# Patient Record
Sex: Female | Born: 1960 | Race: White | Hispanic: No | Marital: Married | Smoking: Former smoker
Health system: Southern US, Community
[De-identification: ages and names within clinical notes are randomized; demographics above are authoritative.]

## PROBLEM LIST (undated history)

## (undated) DIAGNOSIS — I219 Acute myocardial infarction, unspecified: Secondary | ICD-10-CM

## (undated) DIAGNOSIS — I10 Essential (primary) hypertension: Secondary | ICD-10-CM

## (undated) DIAGNOSIS — C801 Malignant (primary) neoplasm, unspecified: Secondary | ICD-10-CM

## (undated) DIAGNOSIS — Z923 Personal history of irradiation: Secondary | ICD-10-CM

## (undated) DIAGNOSIS — D649 Anemia, unspecified: Secondary | ICD-10-CM

## (undated) DIAGNOSIS — I509 Heart failure, unspecified: Secondary | ICD-10-CM

## (undated) DIAGNOSIS — G709 Myoneural disorder, unspecified: Secondary | ICD-10-CM

## (undated) DIAGNOSIS — E669 Obesity, unspecified: Secondary | ICD-10-CM

## (undated) DIAGNOSIS — I2510 Atherosclerotic heart disease of native coronary artery without angina pectoris: Secondary | ICD-10-CM

## (undated) DIAGNOSIS — F329 Major depressive disorder, single episode, unspecified: Secondary | ICD-10-CM

## (undated) DIAGNOSIS — E119 Type 2 diabetes mellitus without complications: Secondary | ICD-10-CM

## (undated) DIAGNOSIS — M1990 Unspecified osteoarthritis, unspecified site: Secondary | ICD-10-CM

## (undated) DIAGNOSIS — F32A Depression, unspecified: Secondary | ICD-10-CM

## (undated) DIAGNOSIS — G4730 Sleep apnea, unspecified: Secondary | ICD-10-CM

## (undated) DIAGNOSIS — E7800 Pure hypercholesterolemia, unspecified: Secondary | ICD-10-CM

## (undated) HISTORY — DX: Obesity, unspecified: E66.9

## (undated) HISTORY — DX: Unspecified osteoarthritis, unspecified site: M19.90

## (undated) HISTORY — DX: Acute myocardial infarction, unspecified: I21.9

## (undated) HISTORY — DX: Atherosclerotic heart disease of native coronary artery without angina pectoris: I25.10

## (undated) HISTORY — DX: Sleep apnea, unspecified: G47.30

## (undated) HISTORY — DX: Essential (primary) hypertension: I10

## (undated) HISTORY — PX: HERNIA REPAIR: SHX51

---

## 1987-09-04 HISTORY — PX: CHOLECYSTECTOMY OPEN: SUR202

## 1989-05-04 HISTORY — PX: UMBILICAL HERNIA REPAIR: SHX196

## 2008-11-11 ENCOUNTER — Encounter: Admission: RE | Admit: 2008-11-11 | Discharge: 2008-11-11 | Payer: Self-pay | Admitting: Specialist

## 2009-03-07 ENCOUNTER — Encounter: Payer: Self-pay | Admitting: Cardiovascular Disease

## 2009-03-08 ENCOUNTER — Encounter: Payer: Self-pay | Admitting: Cardiovascular Disease

## 2009-03-09 ENCOUNTER — Encounter: Payer: Self-pay | Admitting: Cardiovascular Disease

## 2009-05-01 ENCOUNTER — Emergency Department (HOSPITAL_COMMUNITY): Admission: EM | Admit: 2009-05-01 | Discharge: 2009-05-01 | Payer: Self-pay | Admitting: Emergency Medicine

## 2009-05-04 ENCOUNTER — Ambulatory Visit: Payer: Self-pay | Admitting: Cardiology

## 2009-05-05 ENCOUNTER — Inpatient Hospital Stay (HOSPITAL_COMMUNITY): Admission: EM | Admit: 2009-05-05 | Discharge: 2009-05-11 | Payer: Self-pay | Admitting: Emergency Medicine

## 2009-05-12 ENCOUNTER — Encounter: Payer: Self-pay | Admitting: Cardiovascular Disease

## 2009-05-24 DIAGNOSIS — E119 Type 2 diabetes mellitus without complications: Secondary | ICD-10-CM | POA: Insufficient documentation

## 2009-05-24 DIAGNOSIS — G4733 Obstructive sleep apnea (adult) (pediatric): Secondary | ICD-10-CM | POA: Insufficient documentation

## 2009-05-24 DIAGNOSIS — I10 Essential (primary) hypertension: Secondary | ICD-10-CM | POA: Insufficient documentation

## 2009-05-24 DIAGNOSIS — E669 Obesity, unspecified: Secondary | ICD-10-CM | POA: Insufficient documentation

## 2009-05-24 DIAGNOSIS — G4730 Sleep apnea, unspecified: Secondary | ICD-10-CM

## 2009-05-25 ENCOUNTER — Telehealth (INDEPENDENT_AMBULATORY_CARE_PROVIDER_SITE_OTHER): Payer: Self-pay | Admitting: *Deleted

## 2009-05-25 ENCOUNTER — Ambulatory Visit: Payer: Self-pay | Admitting: Cardiovascular Disease

## 2009-05-25 ENCOUNTER — Encounter: Payer: Self-pay | Admitting: Physician Assistant

## 2009-05-25 DIAGNOSIS — E1169 Type 2 diabetes mellitus with other specified complication: Secondary | ICD-10-CM | POA: Insufficient documentation

## 2009-05-25 DIAGNOSIS — I2510 Atherosclerotic heart disease of native coronary artery without angina pectoris: Secondary | ICD-10-CM | POA: Insufficient documentation

## 2009-05-25 DIAGNOSIS — E785 Hyperlipidemia, unspecified: Secondary | ICD-10-CM | POA: Insufficient documentation

## 2009-06-06 ENCOUNTER — Encounter: Payer: Self-pay | Admitting: Cardiovascular Disease

## 2009-06-30 ENCOUNTER — Encounter (HOSPITAL_COMMUNITY): Admission: RE | Admit: 2009-06-30 | Discharge: 2009-09-02 | Payer: Self-pay | Admitting: Cardiovascular Disease

## 2009-06-30 ENCOUNTER — Encounter: Payer: Self-pay | Admitting: Cardiovascular Disease

## 2009-07-04 ENCOUNTER — Encounter: Payer: Self-pay | Admitting: Cardiovascular Disease

## 2009-07-14 ENCOUNTER — Encounter: Payer: Self-pay | Admitting: Cardiovascular Disease

## 2009-07-21 ENCOUNTER — Encounter: Payer: Self-pay | Admitting: Cardiovascular Disease

## 2009-07-25 ENCOUNTER — Encounter: Payer: Self-pay | Admitting: Cardiovascular Disease

## 2009-07-25 ENCOUNTER — Encounter (INDEPENDENT_AMBULATORY_CARE_PROVIDER_SITE_OTHER): Payer: Self-pay | Admitting: *Deleted

## 2009-07-26 ENCOUNTER — Ambulatory Visit: Payer: Self-pay | Admitting: Cardiovascular Disease

## 2009-09-03 ENCOUNTER — Encounter (HOSPITAL_COMMUNITY): Admission: RE | Admit: 2009-09-03 | Discharge: 2009-12-02 | Payer: Self-pay | Admitting: Cardiovascular Disease

## 2009-09-03 HISTORY — PX: CORONARY ANGIOPLASTY WITH STENT PLACEMENT: SHX49

## 2009-09-24 ENCOUNTER — Emergency Department (HOSPITAL_COMMUNITY): Admission: EM | Admit: 2009-09-24 | Discharge: 2009-09-24 | Payer: Self-pay | Admitting: Emergency Medicine

## 2009-10-05 ENCOUNTER — Telehealth: Payer: Self-pay | Admitting: Cardiovascular Disease

## 2009-10-19 ENCOUNTER — Telehealth: Payer: Self-pay | Admitting: Cardiovascular Disease

## 2009-10-31 ENCOUNTER — Ambulatory Visit: Payer: Self-pay | Admitting: Cardiovascular Disease

## 2009-10-31 DIAGNOSIS — R079 Chest pain, unspecified: Secondary | ICD-10-CM | POA: Insufficient documentation

## 2009-11-07 ENCOUNTER — Telehealth (INDEPENDENT_AMBULATORY_CARE_PROVIDER_SITE_OTHER): Payer: Self-pay | Admitting: Radiology

## 2009-11-08 ENCOUNTER — Ambulatory Visit: Payer: Self-pay

## 2009-11-08 ENCOUNTER — Encounter (HOSPITAL_COMMUNITY): Admission: RE | Admit: 2009-11-08 | Discharge: 2010-01-03 | Payer: Self-pay | Admitting: Cardiovascular Disease

## 2009-11-08 ENCOUNTER — Ambulatory Visit: Payer: Self-pay | Admitting: Internal Medicine

## 2009-11-09 ENCOUNTER — Encounter (HOSPITAL_COMMUNITY): Admission: RE | Admit: 2009-11-09 | Discharge: 2010-01-03 | Payer: Self-pay | Admitting: Cardiovascular Disease

## 2009-11-09 ENCOUNTER — Ambulatory Visit: Payer: Self-pay

## 2009-11-11 ENCOUNTER — Encounter: Payer: Self-pay | Admitting: Cardiovascular Disease

## 2009-12-01 ENCOUNTER — Encounter: Payer: Self-pay | Admitting: Cardiovascular Disease

## 2010-01-18 ENCOUNTER — Emergency Department (HOSPITAL_COMMUNITY): Admission: EM | Admit: 2010-01-18 | Discharge: 2010-01-18 | Payer: Self-pay | Admitting: Emergency Medicine

## 2010-03-04 ENCOUNTER — Emergency Department (HOSPITAL_COMMUNITY): Admission: EM | Admit: 2010-03-04 | Discharge: 2010-03-04 | Payer: Self-pay | Admitting: Emergency Medicine

## 2010-03-09 ENCOUNTER — Inpatient Hospital Stay (HOSPITAL_COMMUNITY): Admission: EM | Admit: 2010-03-09 | Discharge: 2010-03-10 | Payer: Self-pay | Admitting: Emergency Medicine

## 2010-06-08 ENCOUNTER — Ambulatory Visit: Payer: Self-pay | Admitting: Cardiovascular Disease

## 2010-06-14 ENCOUNTER — Encounter: Payer: Self-pay | Admitting: Cardiovascular Disease

## 2010-06-16 ENCOUNTER — Encounter: Payer: Self-pay | Admitting: Cardiovascular Disease

## 2010-06-19 ENCOUNTER — Encounter: Payer: Self-pay | Admitting: Cardiovascular Disease

## 2010-07-10 ENCOUNTER — Telehealth: Payer: Self-pay | Admitting: Cardiovascular Disease

## 2010-10-03 NOTE — Miscellaneous (Signed)
Summary: [facility]  [facility]   Imported By: Roderic Ovens 09/06/2009 16:09:51  _____________________________________________________________________  External Attachment:    Type:   Image     Comment:   External Document

## 2010-10-03 NOTE — Progress Notes (Signed)
Summary: Nuc Pre-Procedure  Phone Note Outgoing Call Call back at Home Phone [phone]   Call placed by: Harlow Asa, CNMT Call placed to: Patient Reason for Call: Confirm/change Appt Summary of Call: Reviewed information on Myoview Information Sheet (see scanned document for further details).  Spoke with patient.     Nuclear Med Background Indications for Stress Test: Evaluation for Ischemia, Stent Patency   History: Angioplasty, Heart Catheterization, Myocardial Infarction, Myocardial Perfusion Study, Stents  History Comments: 7/10- Positve MPS-Cath- PTCA-Diag1. 9/10- MI treated with Stents- LAD, Diag1. EF= 45% OSA(CPAP)     Nuclear Pre-Procedure Cardiac Risk Factors: Family History - CAD, Hypertension, IDDM Type 2, Lipids Height (in): 62

## 2010-10-03 NOTE — Progress Notes (Signed)
Summary: CHEST PAINS LAST NIGHT NO PAINS TODAY BUT TOOK NITRO LAST NIGHT  Phone Note Call from Patient Call back at Home Phone [phone]   Caller: Patient Summary of Call: CHEST PAINS  LAST NIGHT TOOK NITRO.NO CHEST PAINS TODAY. Initial call taken by: Judie Grieve,  October 19, 2009 11:03 AM  Follow-up for Phone Call        Called patient ....she states that her father passed away suddenly on 10-29-2022 and she had some chest discomfort that day but felt she did not need to take NTG. Yesterday she had chest pain with minimal activity and took 1 NTG with relief of pain. Today she is pain free. Advised she could see another physician but not Dr.Heavenlee Maiorana because he is not in the office this week. She wants to wait to see him week after next for the appointment that is already scheduled. She was advised to call us back if she has any recurrrent chest pain because she would need to be seen ASAP or go to the ER...she verbalized understanding. Follow-up by: Suzan Garibaldi RN

## 2010-10-03 NOTE — Miscellaneous (Signed)
Summary: [facility]  [facility]   Imported By: Roderic Ovens 01/17/2010 16:01:11  _____________________________________________________________________  External Attachment:    Type:   Image     Comment:   External Document

## 2010-10-03 NOTE — Progress Notes (Signed)
Summary: pt cant afford meds  Phone Note Call from Patient Call back at Home Phone [phone]   Caller: Patient Summary of Call: pt having trouble getting her EFFIENT 10 MG TABS take one daily because she cant afford it and wants to talk to someone about it Initial call taken by: Omer Jack,  October 05, 2009 9:41 AM  Follow-up for Phone Call        [name] medicaid has been cancelled for now. She is working with them to get it reinstated. Wanted to know if we had any samples. I do have samples and will give her a 2 month supply. It was left up front. Follow-up by: Duncan Dull, RN, BSN,  October 05, 2009 11:46 AM     Appended Document: pt cant afford meds Pt had complex bifurcation stenting and requires lont-term effient. Can we give her info about the drug-assistance program since she has lost her insurance? thx  Appended Document: pt cant afford meds I spoke with the pt and she will pick-up [employer] form today.

## 2010-10-03 NOTE — Assessment & Plan Note (Signed)
Summary: ROV   Visit Type:  Follow-up Primary Provider:  Lerry Liner, MD  CC:  No complaints.  History of Present Illness: This is a 50 year-old diabetic woman who presented with ACS in September 2010. She had complex CAD involving the LAD/diagonal bifurcation and was treated with bifurcation stenting using DES.   The patient had a low-risk Myoview study performed in March 2011 showing probable attenuation artifact but no significant ischemia.  The patient is doing well at present and has no chest pain, dyspnea, edema, or palpitations. She denies orthopnea or PND. Reports regular walking for exercise, about 20 minutes a few times per week.    Current Medications (verified): 1)  Aspirin 81 Mg Tbec (Aspirin) .... Take One Tablet By Mouth Daily 2)  Metoprolol Tartrate 25 Mg Tabs (Metoprolol Tartrate) .Marland Kitchen.. 1 Tab Two Times A Day 3)  Effient 10 Mg Tabs (Prasugrel Hcl) .... Take One Daily 4)  Humalog 100 Unit/ml Soln (Insulin Lispro (Human)) .Marland Kitchen.. 10 Units Three Times A Day 5)  Levemir Flexpen 100 Unit/ml Soln (Insulin Detemir) .... 60 Units Twice Daily 6)  Lipitor 40 Mg Tabs (Atorvastatin Calcium) .... Take One Tablet By Mouth Every Evening 7)  Nitrostat 0.4 Mg Subl (Nitroglycerin) .... Take As Needed 8)  Quinapril Hcl 40 Mg Tabs (Quinapril Hcl) .... Take 1/2 Tablet By Mouth Daily 9)  Isosorbide Mononitrate Cr 30 Mg Xr24h-Tab (Isosorbide Mononitrate) .... Take One Tablet By Mouth Daily 10)  Pristiq 50 Mg Xr24h-Tab (Desvenlafaxine Succinate) .... Take 1 Tablet By Mouth Once A Day  Allergies: 1)  ! * Plavix  Past History:  Past medical history reviewed for relevance to current acute and chronic problems.  Past Medical History: Reviewed history from 07/26/2009 and no changes required.  She has a   history of obesity, history of diabetes on insulin, history of   hypertension, history of obstructive sleep apnea on CPAP, CAD s/p NSTEMI 05/2009 treated with drug-eluting  stents.  Review of Systems       Negative except as per HPI   Vital Signs:  Patient profile:   50 year old female Height:      62 inches Weight:      265.75 pounds BMI:     48.78 Pulse rate:   76 / minute Pulse rhythm:   regular Resp:     18 per minute BP sitting:   110 / 78  (left arm) Cuff size:   large  Vitals Entered By: Vikki Ports (June 08, 2010 3:21 PM)  Physical Exam  General:  Pt is alert and oriented, morbidly obese woman, in no acute distress. HEENT: normal Neck: normal carotid upstrokes without bruits, JVP normal Lungs: CTA CV: RRR without murmur or gallop Abd: soft, NT, positive BS, no bruit, no organomegaly Ext: no clubbing, cyanosis, or edema. peripheral pulses 2+ and equal Skin: warm and dry without rash    EKG  Procedure date:  06/08/2010  Findings:      NSR 76 bpm, RBBB  Impression & Recommendations:  Problem # 1:  CAD, NATIVE VESSEL (ICD-414.01) Pt stable without angina. She has undergone complex bifurcation stenting and should remain on long-term dual antiplatelet Rx if tolerated. Encouraged increased exercise with a goal of significant weight loss.  Her updated medication list for this problem includes:    Aspirin 81 Mg Tbec (Aspirin) .Marland Kitchen... Take one tablet by mouth daily    Metoprolol Tartrate 25 Mg Tabs (Metoprolol tartrate) .Marland Kitchen... 1 tab two times a day  Effient 10 Mg Tabs (Prasugrel hcl) .Marland Kitchen... Take one daily    Nitrostat 0.4 Mg Subl (Nitroglycerin) .Marland Kitchen... Take as needed    Quinapril Hcl 40 Mg Tabs (Quinapril hcl) .Marland Kitchen... Take 1/2 tablet by mouth daily    Isosorbide Mononitrate Cr 30 Mg Xr24h-tab (Isosorbide mononitrate) .Marland Kitchen... Take one tablet by mouth daily  Orders: EKG w/ Interpretation (93000)  Problem # 2:  HYPERLIPIDEMIA-MIXED (ICD-272.4) Followed by Dr Mayford Knife. Goal LDL 70 mg/dL.  Her updated medication list for this problem includes:    Lipitor 40 Mg Tabs (Atorvastatin calcium) .Marland Kitchen... Take one tablet by mouth every  evening  Orders: EKG w/ Interpretation (93000)  Problem # 3:  HYPERTENSION (ICD-401.9) Well-controlled.  The following medications were removed from the medication list:    Indapamide 1.25 Mg Tabs (Indapamide) .Marland Kitchen... 1 tab once daily Her updated medication list for this problem includes:    Aspirin 81 Mg Tbec (Aspirin) .Marland Kitchen... Take one tablet by mouth daily    Metoprolol Tartrate 25 Mg Tabs (Metoprolol tartrate) .Marland Kitchen... 1 tab two times a day    Quinapril Hcl 40 Mg Tabs (Quinapril hcl) .Marland Kitchen... Take 1/2 tablet by mouth daily  Orders: EKG w/ Interpretation (93000)  BP today: 110/78 Prior BP: 110/70 (10/31/2009)  Patient Instructions: 1)  Your physician recommends that you continue on your current medications as directed. Please refer to the Current Medication list given to you today. 2)  Your physician wants you to follow-up in: 6 MONTHS.   You will receive a reminder letter in the mail two months in advance. If you don't receive a letter, please call our office to schedule the follow-up appointment. Prescriptions: ISOSORBIDE MONONITRATE CR 30 MG XR24H-TAB (ISOSORBIDE MONONITRATE) Take one tablet by mouth daily  #30 x 8   Entered by:   Julieta Gutting, RN, BSN   Authorized by:   Norva Karvonen, MD   Signed by:   Julieta Gutting, RN, BSN on 06/08/2010   Method used:   Electronically to        [facility] [address] [id number]       [address]       [place], [place]  [address]       Ph: [phone]       Fax: [phone]   RxID:   [id number] EFFIENT 10 MG TABS (PRASUGREL HCL) take one daily  #30 x 8   Entered by:   Julieta Gutting, RN, BSN   Authorized by:   Norva Karvonen, MD   Signed by:   Julieta Gutting, RN, BSN on 06/08/2010   Method used:   [facility]ronically to        [facility] [address] [phone][facility] (retail)       [address]       [place], [place]  [address]       Ph: [phone]       Fax: [phone]   RxID:    [id number] LIPITOR 40 MG TABS (ATORVASTATIN CALCIUM) take one tablet by mouth every evening  #30 x 8   Entered by:   Julieta Gutting, RN, BSN   Authorized by:   Norva Karvonen, MD   Signed by:   Julieta Gutting, RN, BSN on 06/08/2010   Method used:   Electron[facility] to        [facility] [phone][facility] (retail)  [address]       [place], [place]  [address]       Ph: [phone]       Fax: [phone]   RxID:   [id number] QUINAPRIL HCL 40 MG TABS (QUINAPRIL HCL) take 1/2 tablet by mouth daily  #30 x 8   Entered by:   Julieta Gutting, RN, BSN   Authorized by:   Norva Karvonen, MD   Signed by:   Julieta Gutting, RN, BSN on 06/08/2010   Method used:   [facility] to        [id number]       [address]       [place], [place]  [address]       Ph: [phone]       Fax: [phone]   RxID:   [id number]

## 2010-10-03 NOTE — Miscellaneous (Signed)
Summary: [facility]   [facility]   Imported By: Roderic Ovens 11/23/2009 15:14:57  _____________________________________________________________________  External Attachment:    Type:   Image     Comment:   External Document

## 2010-10-03 NOTE — Letter (Signed)
Summary: [facility] [employer]  [facility] [employer]   Imported By: Marylou Mccoy 08/01/2010 14:35:07  _____________________________________________________________________  External Attachment:    Type:   Image     Comment:   External Document

## 2010-10-03 NOTE — Letter (Signed)
Summary: [place] Medicaid  [place] Medicaid   Imported By: Marylou Mccoy 07/04/2010 15:20:49  _____________________________________________________________________  External Attachment:    Type:   Image     Comment:   External Document

## 2010-10-03 NOTE — Letter (Signed)
Summary: [place] Medicaid and Health Choice Prior Authorization Form   [place] Medicaid and Health Choice Prior Authorization Form   Imported By: Roderic Ovens 07/14/2010 14:24:13  _____________________________________________________________________  External Attachment:    Type:   Image     Comment:   External Document

## 2010-10-03 NOTE — Letter (Signed)
Summary: [facility]   [facility]   Imported By: Kassie Mends 09/05/2009 08:23:49  _____________________________________________________________________  External Attachment:    Type:   Image     Comment:   External Document

## 2010-10-03 NOTE — Letter (Signed)
Summary: [place] Medicaid and Health Choice Prior Authorization Form   [place] Medicaid and Health Choice Prior Authorization Form   Imported By: Roderic Ovens 07/14/2010 14:22:59  _____________________________________________________________________  External Attachment:    Type:   Image     Comment:   External Document

## 2010-10-03 NOTE — Assessment & Plan Note (Signed)
Summary: Cardiology Nuclear Study  Nuclear Med Background Indications for Stress Test: Evaluation for Ischemia, Stent Patency   History: Angioplasty, Heart Catheterization, Myocardial Infarction, Myocardial Perfusion Study, Stents  History Comments: 7/10- Positve MPS-Cath- PTCA-Diag1. 9/10- MI treated with Stents- LAD, Diag1. EF= 45% OSA(CPAP)  Symptoms: Chest Pain    Nuclear Pre-Procedure Cardiac Risk Factors: Family History - CAD, Hypertension, IDDM Type 2, Lipids, NIDDM Caffeine/Decaff Intake: none NPO After: 8:00 PM Lungs: clear IV 0.9% NS with Angio Cath: 20g     IV Site: (R) AC IV Started by: Stanton Kidney EMT-P Chest Size (in) 48     Cup Size D     Height (in): 62 Weight (lb): 274 BMI: 50.30 Tech Comments: CBG = 210 @ 7:30 am this day, per Pt.  Nuclear Med Study 1 or 2 day study:  2 day     Stress Test Type:  Eugenie Birks Reading MD:  Arvilla Meres, MD     Referring MD:  M.Cooper Resting Radionuclide:  Technetium 30m Tetrofosmin     Resting Radionuclide Dose:  33.0 mCi  Stress Radionuclide:  Technetium 53m Tetrofosmin     Stress Radionuclide Dose:  33.0 mCi   Stress Protocol   Lexiscan: 0.4 mg   Stress Test Technologist:  Milana Na EMT-P     Nuclear Technologist:  Burna Mortimer Deal RT-N  Rest Procedure  Myocardial perfusion imaging was performed at rest 45 minutes following the intravenous administration of Myoview Technetium 63m Tetrofosmin.  Stress Procedure  The patient received IV Lexiscan 0.4 mg over 15-seconds.  Myoview injected at 30-seconds.  There were no significant changes with infusion.  Quantitative spect images were obtained after a 45 minute delay.  QPS Raw Data Images:  There is a breast shadow that accounts for the anterior attenuation. Stress Images:  There is minimal decreased uptake int he mid anterior wall on one vertical long axis image. Rest Images:  Normal homogeneous uptake in all areas of the myocardium. Subtraction (SDS):  There is a  trivial reversible defect in the mid anterior wall on one image on the vertical long axis images. Suspect breast attenuation but cannot exclude trivial ischemia. Transient Ischemic Dilatation:  .74  (Normal <1.22)  Lung/Heart Ratio:  .24  (Normal <0.45)  Quantitative Gated Spect Images QGS EDV:  61 ml QGS ESV:  12 ml QGS EF:  80 % QGS cine images:  Normal  Findings Low risk nuclear study      Overall Impression  Exercise Capacity: Lexiscan study with no exercise. ECG Impression: Baseline: NSR with RBBB; No significant ST segment change with Lexscan. Overall Impression: Low risk stress nuclear study. Overall Impression Comments: There is a trivial reversible defect in the mid anterior wall on one image on the vertical long axis images. Suspect breast attenuation but cannot exclude trivial ischemia.  Appended Document: Cardiology Nuclear Study Low-risk result. continue medical therapy.  Appended Document: Cardiology Nuclear Study PT AWARE./CY

## 2010-10-03 NOTE — Progress Notes (Signed)
Summary: Change cholesterol medication   Phone Note Outgoing Call   Call placed by: Lisabeth Devoid RN,  July 10, 2010 9:56 AM Call placed to: Patient Summary of Call: medication change  Follow-up for Phone Call        I spoke with pt about cholesterol medication change. She will start Simvastatin today and we will send her a reminder for lipids/liver in 3 months. Mylo Red RN    New/Updated Medications: SIMVASTATIN 40 MG TABS (SIMVASTATIN) Take 1 by mouth at bedtime everyday Prescriptions: SIMVASTATIN 40 MG TABS (SIMVASTATIN) Take 1 by mouth at bedtime everyday  #30 x 6   Entered by:   Lisabeth Devoid RN   Authorized by:   Norva Karvonen, MD   Signed by:   Lisabeth Devoid RN on 07/10/2010   Method used:   [facility] to        [facility] [address] [phone])       [address]       [place], [place]  [address]       Ph: [phone]       Fax: [phone]   RxID:   [id number]

## 2010-10-03 NOTE — Assessment & Plan Note (Signed)
Summary: per check out/sf   Visit Type:  Follow-up Primary Provider:  Lerry Liner, MD  CC:  CP at rehab on Friday.  History of Present Illness: This is a 50 year-old diabetic woman who presented with ACS in September 2010. She had complex CAD involving the LAD/diagonal bifurcation and was treated with bifurcation stenting using DES. She has gone througha stressful time of late with the passing of her father. She has noted recurrent chest pain, both with stress and sometimes at rest. She also noted chest pain on the treadmill at cardiac rehab last week, but has felt ok since that time. Describes the pain as a pressure, but feels different thatn her previous episodes of chest pain predating her stenting procedure. Denies dyspnea, orthopnea, edema, or PND.  Current Medications (verified): 1)  Bufferin 325 Mg Tabs (Aspirin Buf(Cacarb-Mgcarb-Mgo)) .... Take One Daily 2)  Metoprolol Tartrate 25 Mg Tabs (Metoprolol Tartrate) .Marland Kitchen.. 1 Tab Two Times A Day 3)  Effient 10 Mg Tabs (Prasugrel Hcl) .... Take One Daily 4)  Humalog 100 Unit/ml Soln (Insulin Lispro (Human)) .Marland Kitchen.. 10 Units Three Times A Day 5)  Levemir Flexpen 100 Unit/ml Soln (Insulin Detemir) .... 60 Units Twice Daily 6)  Lipitor 40 Mg Tabs (Atorvastatin Calcium) .... Take One Tablet By Mouth Every Evening 7)  Nitrostat 0.4 Mg Subl (Nitroglycerin) .... Take As Needed 8)  Quinapril Hcl 40 Mg Tabs (Quinapril Hcl) .... Take 1/2 Tablet By Mouth Daily 9)  Indapamide 1.25 Mg Tabs (Indapamide) .Marland Kitchen.. 1 Tab Once Daily 10)  Metformin Hcl 500 Mg Tabs (Metformin Hcl) .Marland Kitchen.. 1 Tab Two Times A Day  Allergies (verified): 1)  ! * Plavix  Past History:  Past medical history reviewed for relevance to current acute and chronic problems.  Past Medical History: Reviewed history from 07/26/2009 and no changes required.  She has a   history of obesity, history of diabetes on insulin, history of   hypertension, history of obstructive sleep apnea on CPAP,  CAD s/p NSTEMI 05/2009 treated with drug-eluting stents.  Review of Systems       Negative except as per HPI   Vital Signs:  Patient profile:   50 year old female Height:      62 inches Weight:      275 pounds BMI:     50.48 Pulse rate:   82 / minute Resp:     16 per minute BP sitting:   110 / 70  (left arm) Cuff size:   regular  Vitals Entered By: Hardin Negus, RMA (October 31, 2009 9:09 AM)  Physical Exam  General:  Pt is alert and oriented, morbidly obese woman, in no acute distress. HEENT: normal Neck: normal carotid upstrokes without bruits, JVP normal Lungs: CTA CV: RRR without murmur or gallop Abd: soft, NT, positive BS, no bruit, no organomegaly Ext: no clubbing, cyanosis, or edema. peripheral pulses 2+ and equal Skin: warm and dry without rash    EKG  Procedure date:  10/31/2009  Findings:      NSR with RBBB< HR 82 bpm.  Impression & Recommendations:  Problem # 1:  CAD, NATIVE VESSEL (ICD-414.01) Pt with recurrent chest pain, both typical and atypical features. She underwent high-risk PCI last year with bifurcation stenting. Recommend adenosine Myoview to rule-out significant ischemia. Otherwise continue DAPT with ASA and Effient at present. She should remain on lifelong Effient if tolerated after LAD bifurcation stenting.  Her updated medication list for this problem includes:    Aspirin 81 Mg Tbec (  Aspirin) .Marland Kitchen... Take one tablet by mouth daily    Metoprolol Tartrate 25 Mg Tabs (Metoprolol tartrate) .Marland Kitchen... 1 tab two times a day    Effient 10 Mg Tabs (Prasugrel hcl) .Marland Kitchen... Take one daily    Nitrostat 0.4 Mg Subl (Nitroglycerin) .Marland Kitchen... Take as needed    Quinapril Hcl 40 Mg Tabs (Quinapril hcl) .Marland Kitchen... Take 1/2 tablet by mouth daily    Isosorbide Mononitrate Cr 30 Mg Xr24h-tab (Isosorbide mononitrate) .Marland Kitchen... Take one tablet by mouth daily  Orders: EKG w/ Interpretation (93000) Nuclear Stress Test (Nuc Stress Test)  Problem # 2:  HYPERLIPIDEMIA-MIXED  (ICD-272.4) Followed by PCP, Dr Mayford Knife. LDL goal less than 70 mg/dL.  Her updated medication list for this problem includes:    Lipitor 40 Mg Tabs (Atorvastatin calcium) .Marland Kitchen... Take one tablet by mouth every evening  Problem # 3:  HYPERTENSION (ICD-401.9) BP well-controlled.  Her updated medication list for this problem includes:    Aspirin 81 Mg Tbec (Aspirin) .Marland Kitchen... Take one tablet by mouth daily    Metoprolol Tartrate 25 Mg Tabs (Metoprolol tartrate) .Marland Kitchen... 1 tab two times a day    Quinapril Hcl 40 Mg Tabs (Quinapril hcl) .Marland Kitchen... Take 1/2 tablet by mouth daily    Indapamide 1.25 Mg Tabs (Indapamide) .Marland Kitchen... 1 tab once daily  BP today: 110/70 Prior BP: 116/70 (07/26/2009)  Patient Instructions: 1)  Your physician recommends that you schedule a follow-up appointment in: 3 months 2)  Your physician has recommended you make the following change in your medication: Aspirin 81 mg take one tablet by mouth daily. Imdur 30 mg take onr tablet by mouth once a day 3)  Your physician has requested that you have an adenosine myoview.  For further information please visit [email or web address]  Please follow instruction sheet, as given. Prescriptions: ISOSORBIDE MONONITRATE CR 30 MG XR24H-TAB (ISOSORBIDE MONONITRATE) Take one tablet by mouth daily  #30 x 6   Entered by:   Ollen Gross, RN, BSN   Authorized by:   Norva Karvonen, MD   Signed by:   Ollen Gross, RN, BSN on 10/31/2009   Method used:   [facility] to        [facility] [address] [id number]       [address]       [place], [place]  [address]       Ph: [phone]       Fax: [phone]   RxID:   [id number]

## 2010-10-04 ENCOUNTER — Other Ambulatory Visit: Payer: Self-pay

## 2010-10-04 ENCOUNTER — Ambulatory Visit: Admit: 2010-10-04 | Payer: Self-pay | Admitting: Cardiovascular Disease

## 2010-10-31 IMAGING — US US RENAL PORT
1 series · 14 of 21 positions shown · non-contrast
Comparison: None.

CLINICAL DATA: Nausea, vomiting, diarrhea, hypotension and
dehydration.

RENAL/URINARY TRACT ULTRASOUND COMPLETE

[Series 1: us renal port · 0.31mm/px · 14 of 21 slices shown]
[im 1/21]
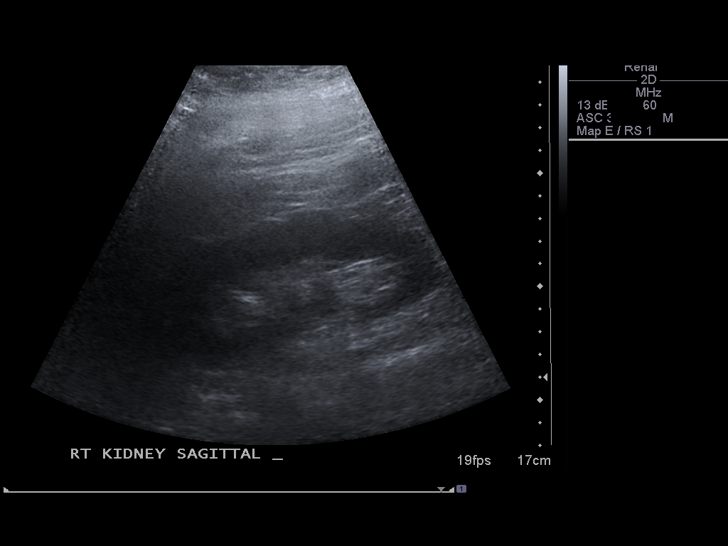
[im 3/21]
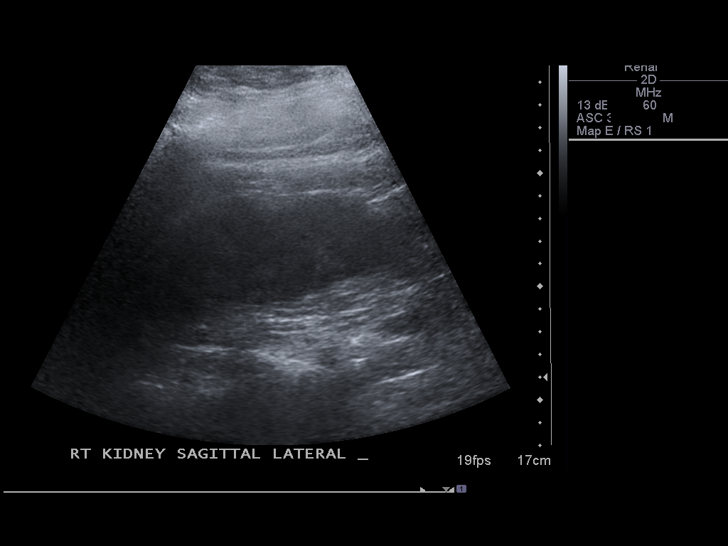
[im 4/21]
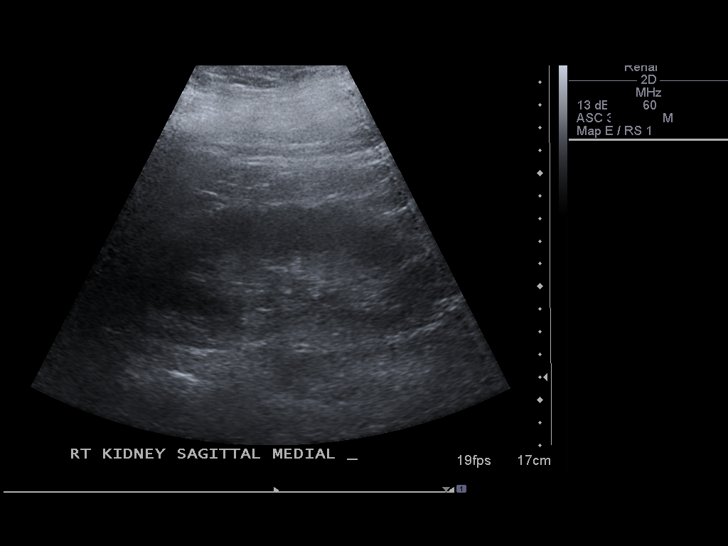
[im 6/21]
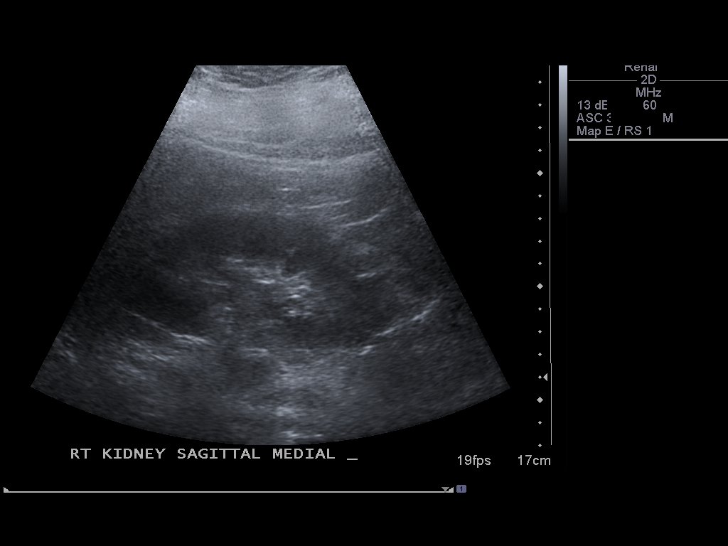
[im 7/21]
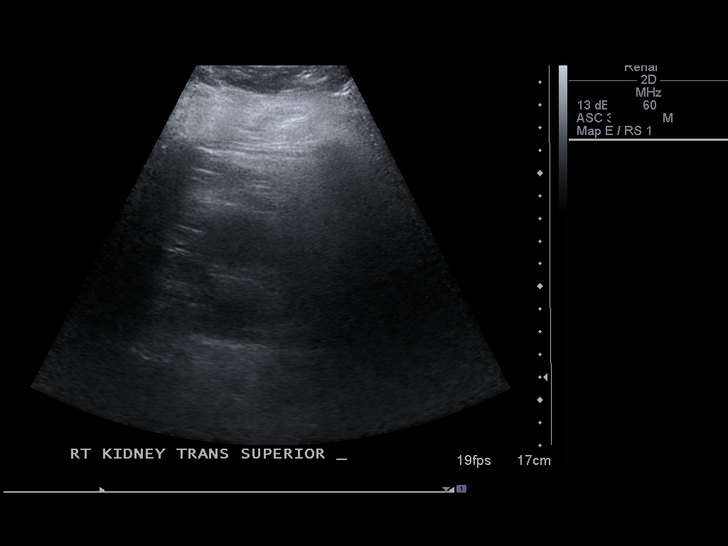
[im 9/21]
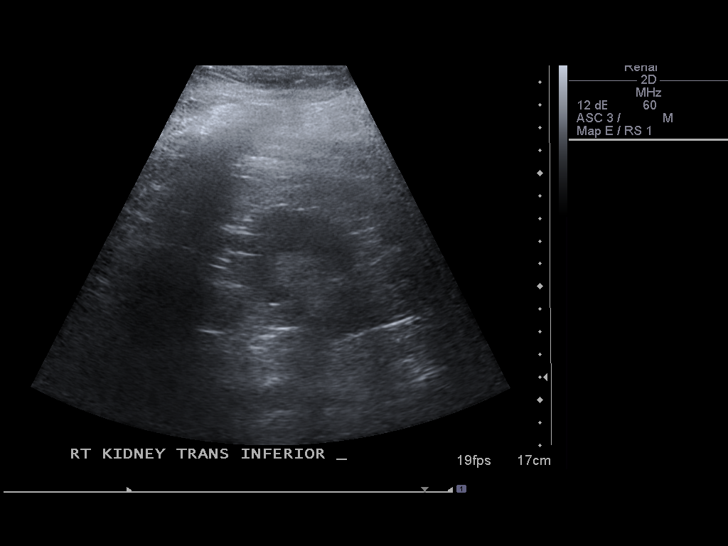
[im 10/21]
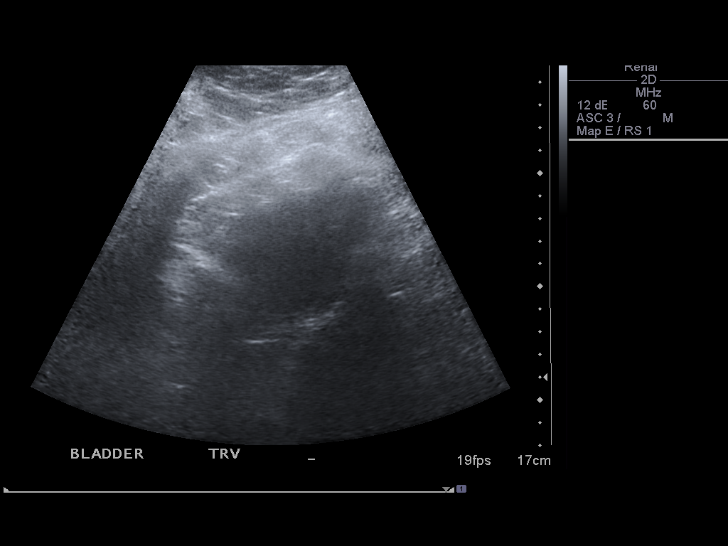
[im 12/21]
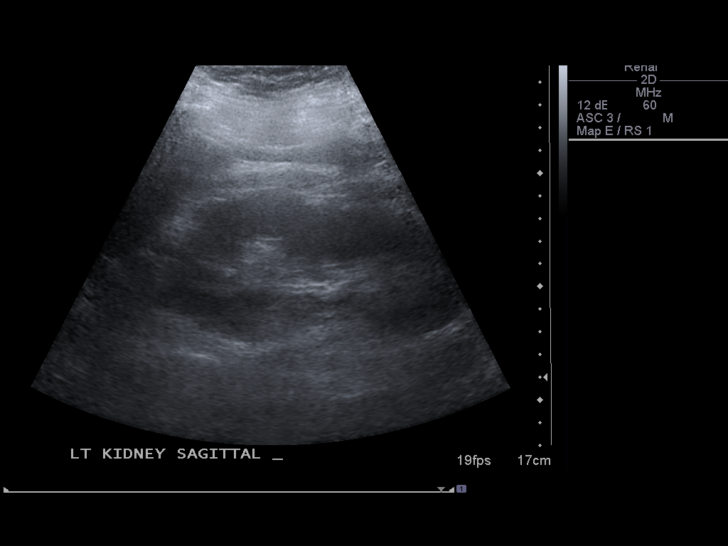
[im 13/21]
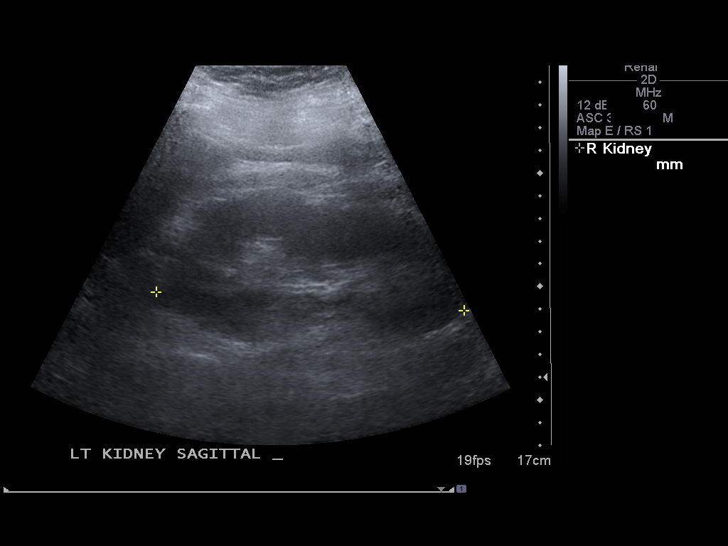
[im 15/21]
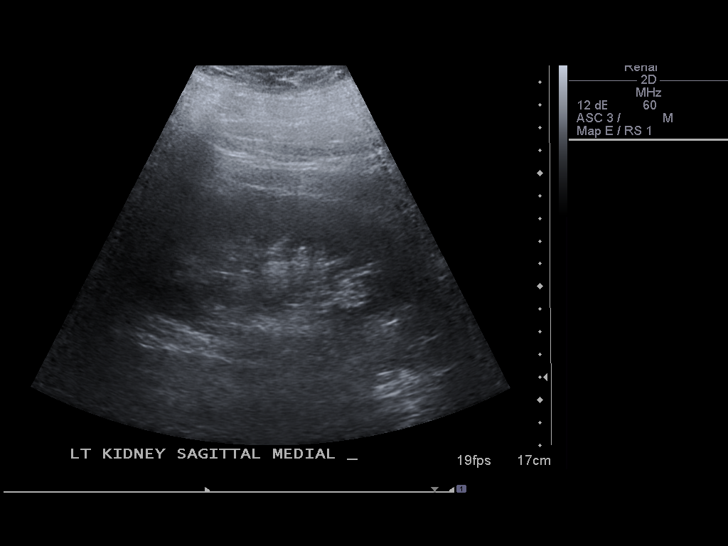
[im 16/21]
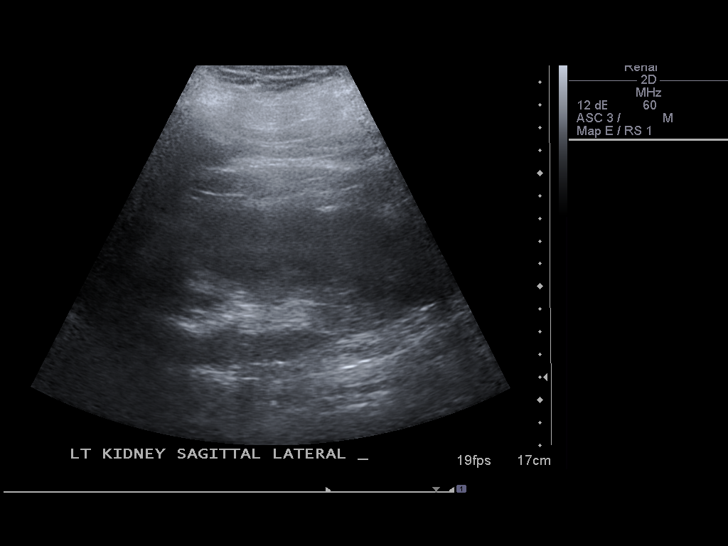
[im 18/21]
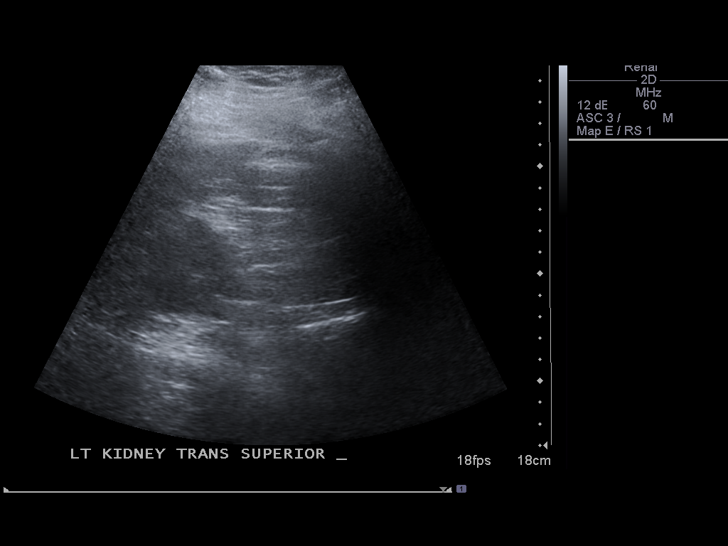
[im 19/21]
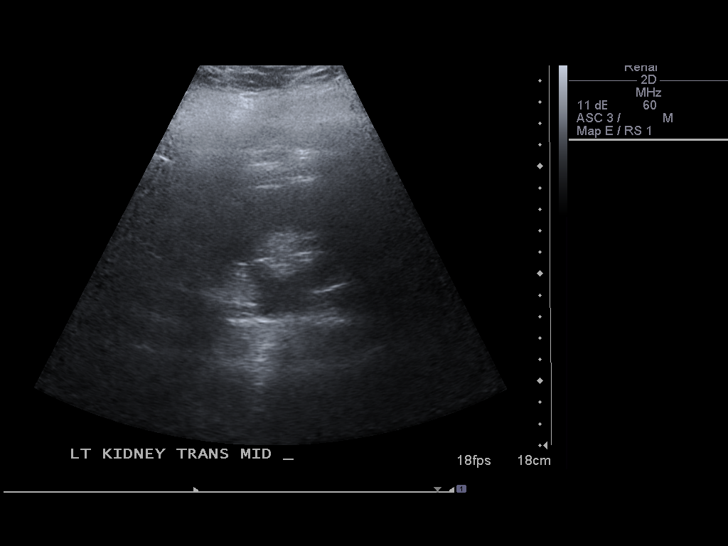
[im 21/21]
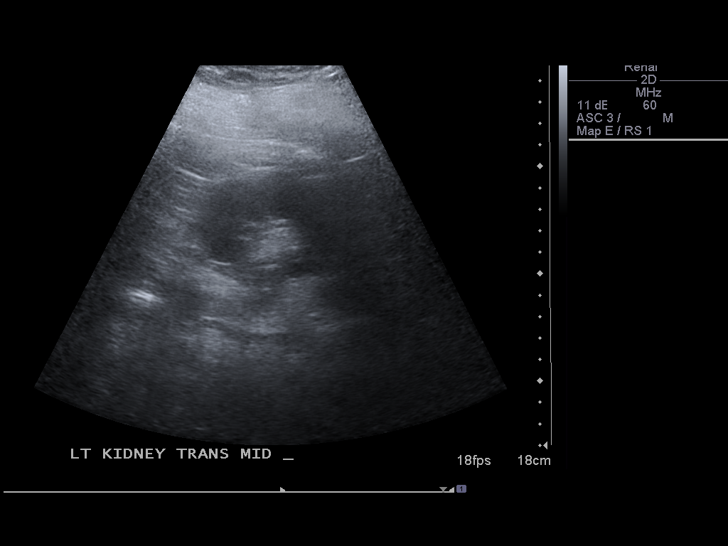

[14 of 21 positions shown; findings below may reference images not displayed]

FINDINGS: Right Kidney:  Measures 13.4 cm, negative.

Left Kidney:  Measures 13.6 cm, negative.

Bladder:  Foley catheter is in place.  Bladder is decompressed,
limiting visualization.
IMPRESSION: No acute findings.

## 2010-10-31 IMAGING — CR DG ABD PORTABLE 1V
1 series · 2 of 2 positions shown · non-contrast
Comparison: None.

CLINICAL DATA: Nausea, vomiting, hypotension, dehydration.

ABDOMEN - 1 VIEW

[Series 1: series (date) · U · 2 of 2 slices shown]
[im 1/2]
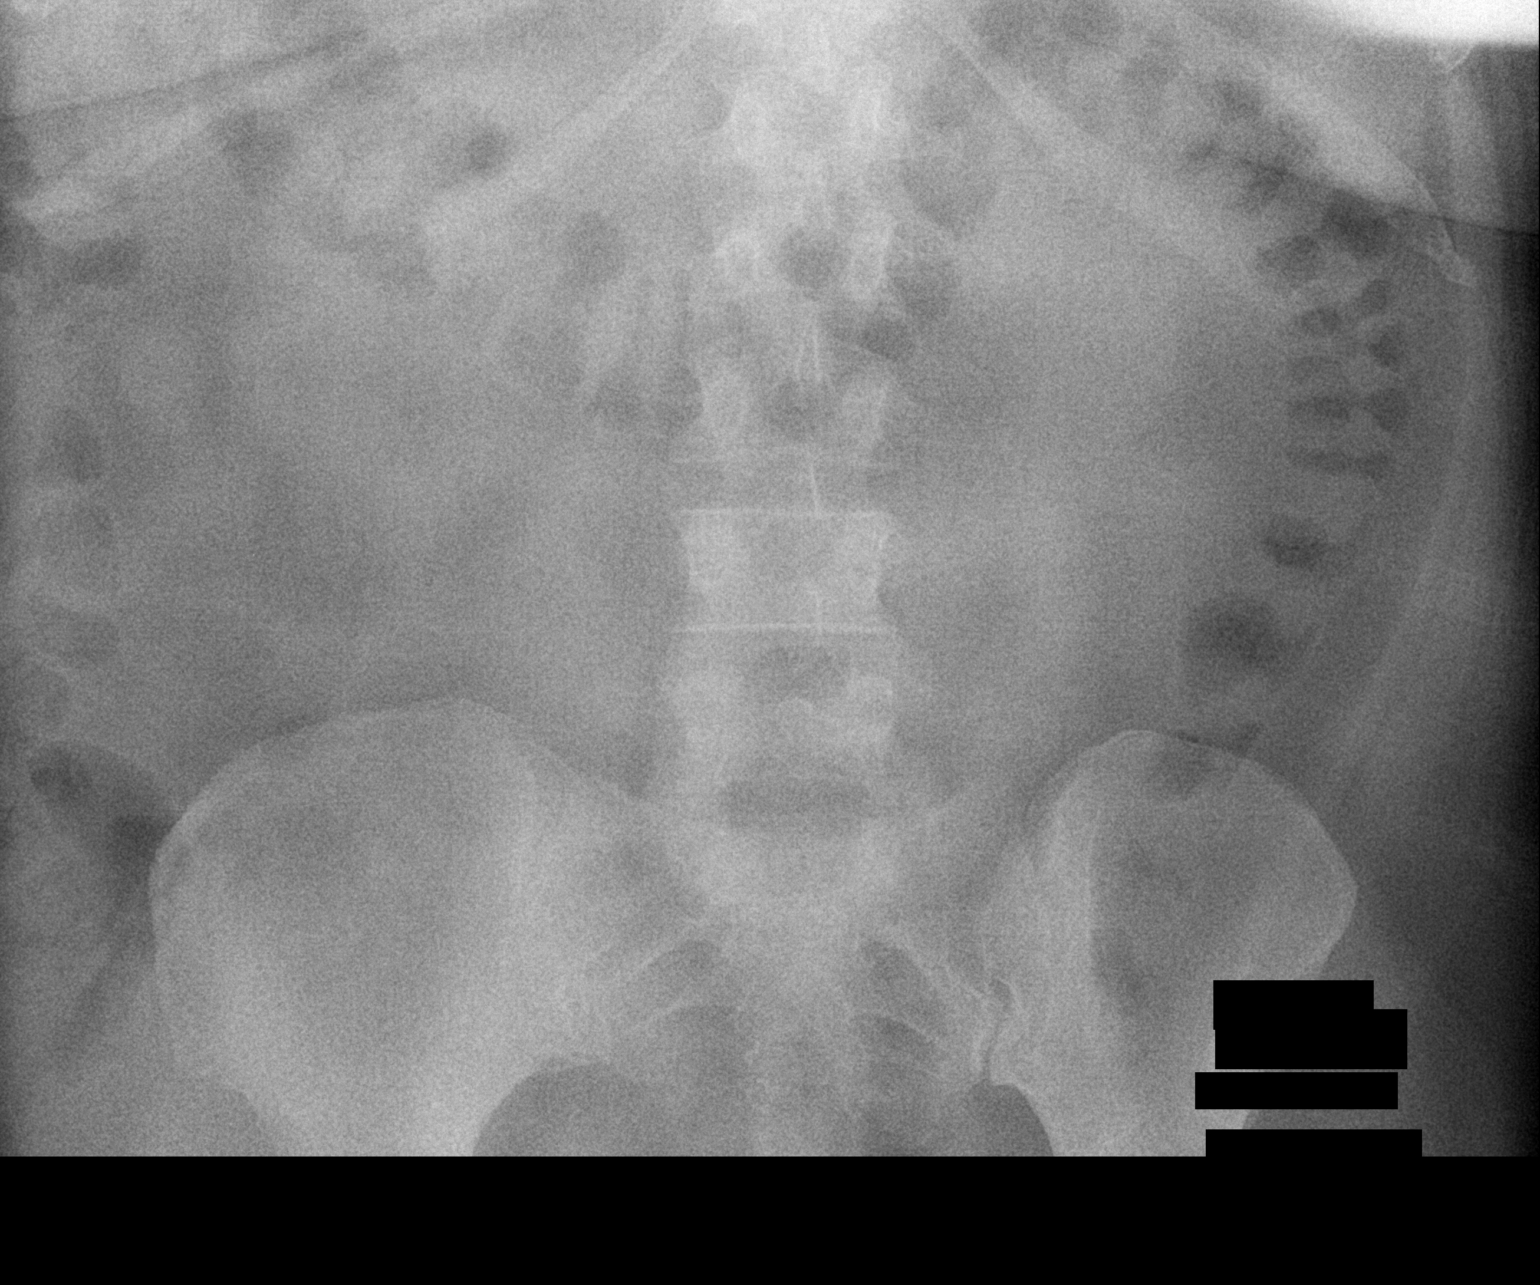
[im 2/2]
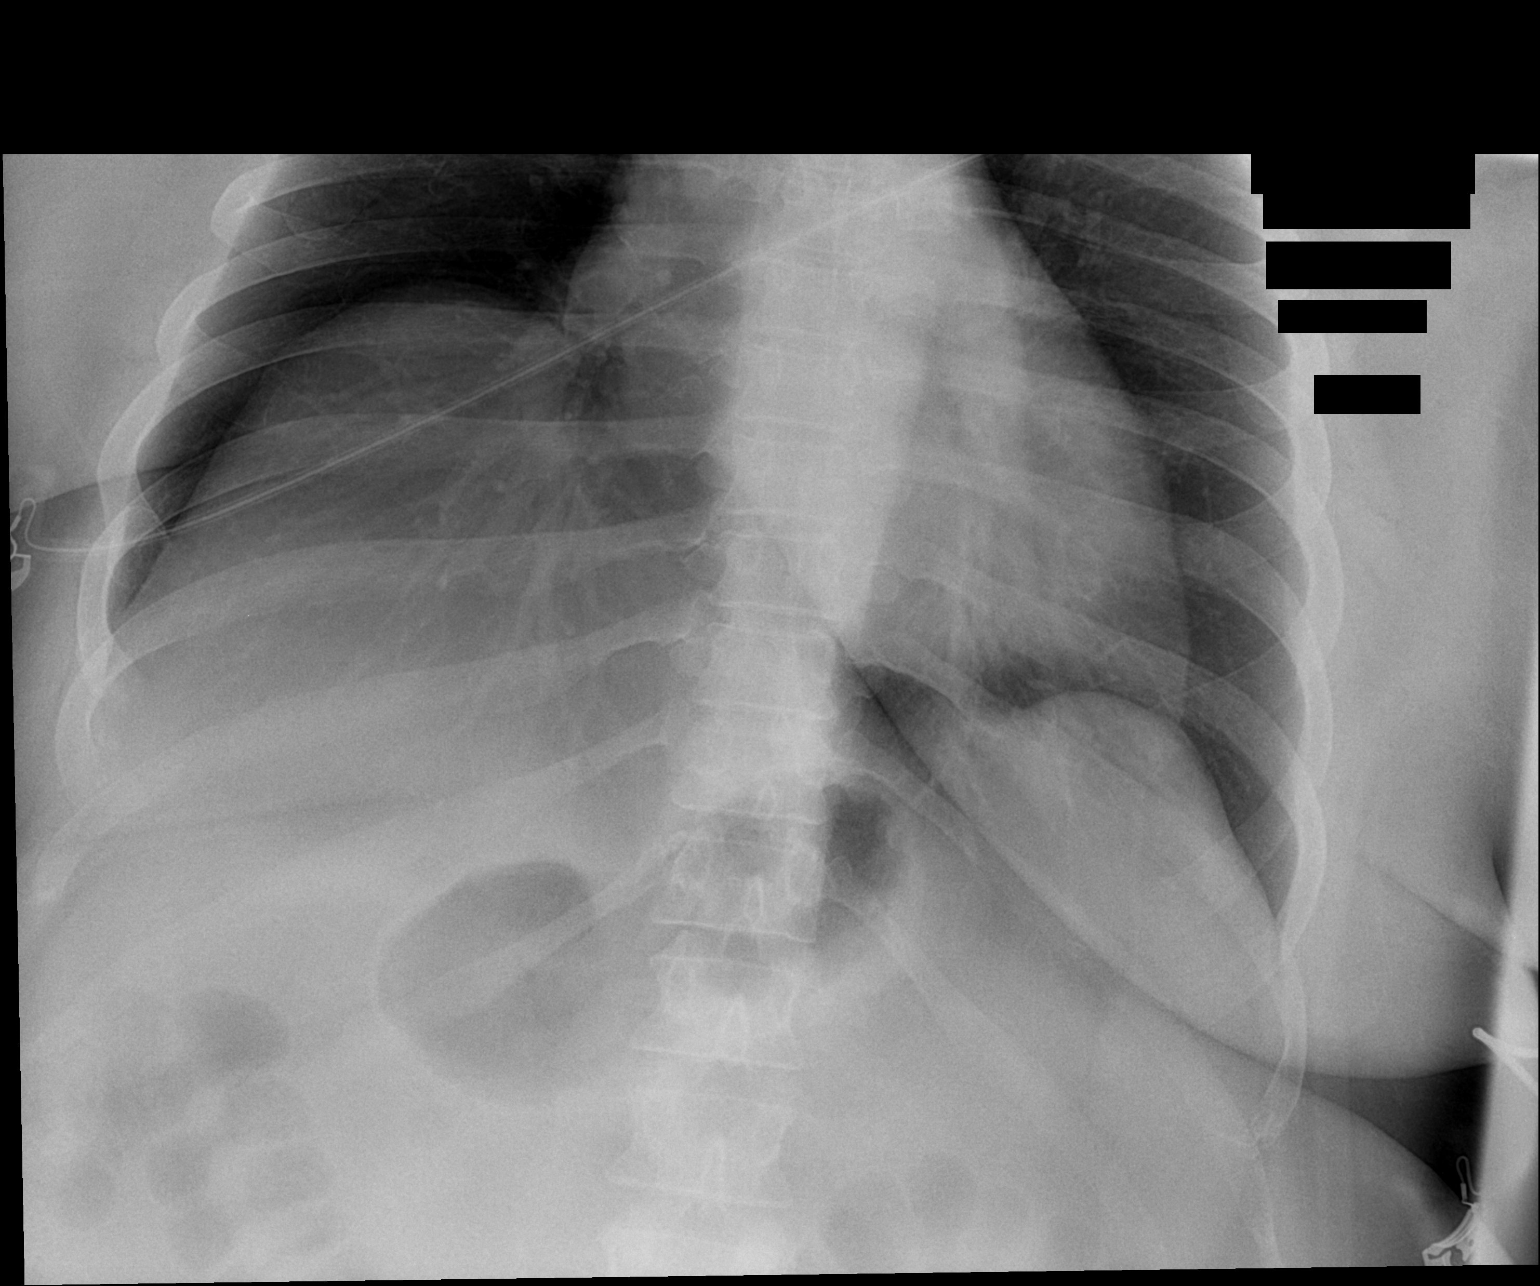

[2 of 2 positions shown; findings below may reference images not displayed]

FINDINGS: Gas is seen in nondilated colon.  No small bowel
dilatation.
IMPRESSION: No acute findings.

## 2010-11-19 LAB — DIFFERENTIAL
Basophils Absolute: 0 10*3/uL (ref 0.0–0.1)
Basophils Absolute: 0.1 10*3/uL (ref 0.0–0.1)
Basophils Relative: 0 % (ref 0–1)
Basophils Relative: 0 % (ref 0–1)
Basophils Relative: 2 % — ABNORMAL HIGH (ref 0–1)
Eosinophils Absolute: 0 10*3/uL (ref 0.0–0.7)
Eosinophils Absolute: 0 10*3/uL (ref 0.0–0.7)
Eosinophils Absolute: 0.1 10*3/uL (ref 0.0–0.7)
Eosinophils Relative: 0 % (ref 0–5)
Eosinophils Relative: 1 % (ref 0–5)
Lymphocytes Relative: 33 % (ref 12–46)
Monocytes Absolute: 0.5 10*3/uL (ref 0.1–1.0)
Monocytes Absolute: 1.1 10*3/uL — ABNORMAL HIGH (ref 0.1–1.0)
Neutro Abs: 15.7 10*3/uL — ABNORMAL HIGH (ref 1.7–7.7)
Neutrophils Relative %: 73 % (ref 43–77)
Neutrophils Relative %: 80 % — ABNORMAL HIGH (ref 43–77)

## 2010-11-19 LAB — CBC
HCT: 33 % — ABNORMAL LOW (ref 36.0–46.0)
HCT: 36.6 % (ref 36.0–46.0)
HCT: 48.7 % — ABNORMAL HIGH (ref 36.0–46.0)
Hemoglobin: 12.7 g/dL (ref 12.0–15.0)
Hemoglobin: 16.4 g/dL — ABNORMAL HIGH (ref 12.0–15.0)
MCH: 28.7 pg (ref 26.0–34.0)
MCHC: 33.6 g/dL (ref 30.0–36.0)
MCHC: 34.2 g/dL (ref 30.0–36.0)
MCHC: 34.7 g/dL (ref 30.0–36.0)
MCV: 84.1 fL (ref 78.0–100.0)
RBC: 4.36 MIL/uL (ref 3.87–5.11)
RDW: 14.4 % (ref 11.5–15.5)

## 2010-11-19 LAB — COMPREHENSIVE METABOLIC PANEL
ALT: 14 U/L (ref 0–35)
ALT: 17 U/L (ref 0–35)
AST: 15 U/L (ref 0–37)
AST: 26 U/L (ref 0–37)
Alkaline Phosphatase: 114 U/L (ref 39–117)
Alkaline Phosphatase: 68 U/L (ref 39–117)
BUN: 16 mg/dL (ref 6–23)
CO2: 19 mEq/L (ref 19–32)
Calcium: 7.4 mg/dL — ABNORMAL LOW (ref 8.4–10.5)
Chloride: 113 mEq/L — ABNORMAL HIGH (ref 96–112)
GFR calc Af Amer: >60 mL/min (ref >60)
GFR calc non Af Amer: 27 mL/min — ABNORMAL LOW (ref >60)
GFR calc non Af Amer: 52 mL/min — ABNORMAL LOW (ref >60)
Glucose, Bld: 154 mg/dL — ABNORMAL HIGH (ref 70–99)
Glucose, Bld: 92 mg/dL (ref 70–99)
Potassium: 3.3 mEq/L — ABNORMAL LOW (ref 3.5–5.1)
Potassium: 4.4 mEq/L (ref 3.5–5.1)
Sodium: 138 mEq/L (ref 135–145)
Total Bilirubin: 0.4 mg/dL (ref 0.3–1.2)
Total Bilirubin: 0.7 mg/dL (ref 0.3–1.2)
Total Protein: 9.6 g/dL — ABNORMAL HIGH (ref 6.0–8.3)

## 2010-11-19 LAB — CULTURE, BLOOD (ROUTINE X 2)
Culture: NO GROWTH
Culture: NO GROWTH

## 2010-11-19 LAB — GLUCOSE, CAPILLARY
Glucose-Capillary: 116 mg/dL — ABNORMAL HIGH (ref 70–99)
Glucose-Capillary: 210 mg/dL — ABNORMAL HIGH (ref 70–99)
Glucose-Capillary: 352 mg/dL — ABNORMAL HIGH (ref 70–99)
Glucose-Capillary: 77 mg/dL (ref 70–99)
Glucose-Capillary: 84 mg/dL (ref 70–99)
Glucose-Capillary: 130 mg/dL — ABNORMAL HIGH (ref 70–99)
Glucose-Capillary: 92 mg/dL (ref 70–99)

## 2010-11-19 LAB — BASIC METABOLIC PANEL
BUN: 5 mg/dL — ABNORMAL LOW (ref 6–23)
CO2: 21 mEq/L (ref 19–32)
Chloride: 109 mEq/L (ref 96–112)
Creatinine, Ser: 0.66 mg/dL (ref 0.4–1.2)
Glucose, Bld: 280 mg/dL — ABNORMAL HIGH (ref 70–99)
Potassium: 4 mEq/L (ref 3.5–5.1)

## 2010-11-19 LAB — URINALYSIS, ROUTINE W REFLEX MICROSCOPIC
Bilirubin Urine: NEGATIVE
Glucose, UA: NEGATIVE mg/dL
Protein, ur: NEGATIVE mg/dL
Specific Gravity, Urine: 1.019 (ref 1.005–1.030)
Urobilinogen, UA: 0.2 mg/dL (ref 0.0–1.0)

## 2010-11-19 LAB — LIPID PANEL
HDL: 25 mg/dL — ABNORMAL LOW (ref >39)
Total CHOL/HDL Ratio: 5.1 RATIO
Triglycerides: 140 mg/dL (ref <150)

## 2010-11-19 LAB — PROCALCITONIN: Procalcitonin: 0.54 ng/mL

## 2010-11-19 LAB — HEMOGLOBIN A1C
Hgb A1c MFr Bld: 10.6 % — ABNORMAL HIGH (ref <5.7)
Mean Plasma Glucose: 258 mg/dL — ABNORMAL HIGH (ref <117)

## 2010-11-19 LAB — TROPONIN I: Troponin I: <0.01 ng/mL (ref 0.00–0.06)

## 2010-11-19 LAB — URINE MICROSCOPIC-ADD ON

## 2010-11-19 LAB — NA AND K (SODIUM & POTASSIUM), RAND UR
Potassium Urine: 48 mEq/L
Sodium, Ur: 78 mEq/L

## 2010-11-19 LAB — CK TOTAL AND CKMB (NOT AT ARMC): CK, MB: 1.1 ng/mL (ref 0.3–4.0)

## 2010-11-19 LAB — TSH: TSH: 0.657 u[IU]/mL (ref 0.350–4.500)

## 2010-11-19 LAB — CARDIAC PANEL(CRET KIN+CKTOT+MB+TROPI): CK, MB: 2.3 ng/mL (ref 0.3–4.0)

## 2010-12-04 LAB — GLUCOSE, CAPILLARY: Glucose-Capillary: 116 mg/dL — ABNORMAL HIGH (ref 70–99)

## 2010-12-05 LAB — GLUCOSE, CAPILLARY
Glucose-Capillary: 215 mg/dL — ABNORMAL HIGH (ref 70–99)
Glucose-Capillary: 224 mg/dL — ABNORMAL HIGH (ref 70–99)

## 2010-12-06 LAB — GLUCOSE, CAPILLARY
Glucose-Capillary: 103 mg/dL — ABNORMAL HIGH (ref 70–99)
Glucose-Capillary: 108 mg/dL — ABNORMAL HIGH (ref 70–99)
Glucose-Capillary: 110 mg/dL — ABNORMAL HIGH (ref 70–99)
Glucose-Capillary: 141 mg/dL — ABNORMAL HIGH (ref 70–99)
Glucose-Capillary: 66 mg/dL — ABNORMAL LOW (ref 70–99)
Glucose-Capillary: 77 mg/dL (ref 70–99)

## 2010-12-08 LAB — COMPREHENSIVE METABOLIC PANEL
AST: 20 U/L (ref 0–37)
Albumin: 3 g/dL — ABNORMAL LOW (ref 3.5–5.2)
BUN: 7 mg/dL (ref 6–23)
Creatinine, Ser: 0.64 mg/dL (ref 0.4–1.2)
GFR calc Af Amer: >60 mL/min (ref >60)
Total Protein: 6.8 g/dL (ref 6.0–8.3)

## 2010-12-08 LAB — CBC
HCT: 33.2 % — ABNORMAL LOW (ref 36.0–46.0)
HCT: 34.7 % — ABNORMAL LOW (ref 36.0–46.0)
HCT: 39.2 % (ref 36.0–46.0)
Hemoglobin: 11.4 g/dL — ABNORMAL LOW (ref 12.0–15.0)
Hemoglobin: 12 g/dL (ref 12.0–15.0)
Hemoglobin: 13.1 g/dL (ref 12.0–15.0)
MCHC: 33.6 g/dL (ref 30.0–36.0)
MCHC: 33.8 g/dL (ref 30.0–36.0)
MCHC: 34 g/dL (ref 30.0–36.0)
MCHC: 34.3 g/dL (ref 30.0–36.0)
MCV: 86.3 fL (ref 78.0–100.0)
MCV: 87 fL (ref 78.0–100.0)
MCV: 87.3 fL (ref 78.0–100.0)
MCV: 87.6 fL (ref 78.0–100.0)
MCV: 87.7 fL (ref 78.0–100.0)
Platelets: 241 10*3/uL (ref 150–400)
Platelets: 252 10*3/uL (ref 150–400)
Platelets: 263 10*3/uL (ref 150–400)
Platelets: 276 10*3/uL (ref 150–400)
Platelets: 299 10*3/uL (ref 150–400)
RBC: 3.85 MIL/uL — ABNORMAL LOW (ref 3.87–5.11)
RBC: 3.91 MIL/uL (ref 3.87–5.11)
RBC: 4.54 MIL/uL (ref 3.87–5.11)
RDW: 13.1 % (ref 11.5–15.5)
RDW: 13.2 % (ref 11.5–15.5)
RDW: 13.4 % (ref 11.5–15.5)
RDW: 13.8 % (ref 11.5–15.5)
RDW: 13.8 % (ref 11.5–15.5)
RDW: 14 % (ref 11.5–15.5)
WBC: 7.5 10*3/uL (ref 4.0–10.5)
WBC: 8.5 10*3/uL (ref 4.0–10.5)
WBC: 8.7 10*3/uL (ref 4.0–10.5)
WBC: 8.9 10*3/uL (ref 4.0–10.5)
WBC: 9.2 10*3/uL (ref 4.0–10.5)

## 2010-12-08 LAB — DIFFERENTIAL
Basophils Absolute: 0 10*3/uL (ref 0.0–0.1)
Basophils Absolute: 0 10*3/uL (ref 0.0–0.1)
Basophils Absolute: 0 K/uL (ref 0.0–0.1)
Basophils Relative: 0 % (ref 0–1)
Basophils Relative: 0 % (ref 0–1)
Basophils Relative: 0 % (ref 0–1)
Basophils Relative: 0 % (ref 0–1)
Eosinophils Absolute: 0.1 10*3/uL (ref 0.0–0.7)
Eosinophils Absolute: 0.2 10*3/uL (ref 0.0–0.7)
Eosinophils Absolute: 0.2 10*3/uL (ref 0.0–0.7)
Eosinophils Absolute: 0.2 10*3/uL (ref 0.0–0.7)
Eosinophils Relative: 2 % (ref 0–5)
Eosinophils Relative: 2 % (ref 0–5)
Lymphocytes Relative: 16 % (ref 12–46)
Lymphocytes Relative: 19 % (ref 12–46)
Lymphs Abs: 1.4 10*3/uL (ref 0.7–4.0)
Lymphs Abs: 1.7 10*3/uL (ref 0.7–4.0)
Lymphs Abs: 1.9 10*3/uL (ref 0.7–4.0)
Monocytes Absolute: 0.5 10*3/uL (ref 0.1–1.0)
Monocytes Absolute: 0.5 K/uL (ref 0.1–1.0)
Monocytes Absolute: 0.6 10*3/uL (ref 0.1–1.0)
Monocytes Relative: 5 % (ref 3–12)
Monocytes Relative: 5 % (ref 3–12)
Monocytes Relative: 5 % (ref 3–12)
Monocytes Relative: 6 % (ref 3–12)
Neutro Abs: 5.8 10*3/uL (ref 1.7–7.7)
Neutro Abs: 6.4 K/uL (ref 1.7–7.7)
Neutro Abs: 6.8 10*3/uL (ref 1.7–7.7)
Neutro Abs: 7.4 10*3/uL (ref 1.7–7.7)
Neutro Abs: 7.7 10*3/uL (ref 1.7–7.7)
Neutrophils Relative %: 68 % (ref 43–77)
Neutrophils Relative %: 69 % (ref 43–77)
Neutrophils Relative %: 73 % (ref 43–77)
Neutrophils Relative %: 73 % (ref 43–77)
Neutrophils Relative %: 74 % (ref 43–77)

## 2010-12-08 LAB — GLUCOSE, CAPILLARY
Glucose-Capillary: 108 mg/dL — ABNORMAL HIGH (ref 70–99)
Glucose-Capillary: 123 mg/dL — ABNORMAL HIGH (ref 70–99)
Glucose-Capillary: 129 mg/dL — ABNORMAL HIGH (ref 70–99)
Glucose-Capillary: 136 mg/dL — ABNORMAL HIGH (ref 70–99)
Glucose-Capillary: 143 mg/dL — ABNORMAL HIGH (ref 70–99)
Glucose-Capillary: 156 mg/dL — ABNORMAL HIGH (ref 70–99)
Glucose-Capillary: 160 mg/dL — ABNORMAL HIGH (ref 70–99)
Glucose-Capillary: 180 mg/dL — ABNORMAL HIGH (ref 70–99)
Glucose-Capillary: 199 mg/dL — ABNORMAL HIGH (ref 70–99)
Glucose-Capillary: 216 mg/dL — ABNORMAL HIGH (ref 70–99)
Glucose-Capillary: 221 mg/dL — ABNORMAL HIGH (ref 70–99)
Glucose-Capillary: 229 mg/dL — ABNORMAL HIGH (ref 70–99)
Glucose-Capillary: 249 mg/dL — ABNORMAL HIGH (ref 70–99)
Glucose-Capillary: 254 mg/dL — ABNORMAL HIGH (ref 70–99)
Glucose-Capillary: 272 mg/dL — ABNORMAL HIGH (ref 70–99)
Glucose-Capillary: 275 mg/dL — ABNORMAL HIGH (ref 70–99)
Glucose-Capillary: 278 mg/dL — ABNORMAL HIGH (ref 70–99)
Glucose-Capillary: 281 mg/dL — ABNORMAL HIGH (ref 70–99)
Glucose-Capillary: 288 mg/dL — ABNORMAL HIGH (ref 70–99)
Glucose-Capillary: 298 mg/dL — ABNORMAL HIGH (ref 70–99)
Glucose-Capillary: 300 mg/dL — ABNORMAL HIGH (ref 70–99)
Glucose-Capillary: 315 mg/dL — ABNORMAL HIGH (ref 70–99)
Glucose-Capillary: 353 mg/dL — ABNORMAL HIGH (ref 70–99)
Glucose-Capillary: 370 mg/dL — ABNORMAL HIGH (ref 70–99)
Glucose-Capillary: 379 mg/dL — ABNORMAL HIGH (ref 70–99)
Glucose-Capillary: 387 mg/dL — ABNORMAL HIGH (ref 70–99)
Glucose-Capillary: 426 mg/dL — ABNORMAL HIGH (ref 70–99)

## 2010-12-08 LAB — CK TOTAL AND CKMB (NOT AT ARMC)
CK, MB: 17.1 ng/mL — ABNORMAL HIGH (ref 0.3–4.0)
CK, MB: 18.8 ng/mL — ABNORMAL HIGH (ref 0.3–4.0)
Relative Index: 11.3 — ABNORMAL HIGH (ref 0.0–2.5)
Relative Index: 9.2 — ABNORMAL HIGH (ref 0.0–2.5)
Relative Index: 9.9 — ABNORMAL HIGH (ref 0.0–2.5)
Total CK: 167 U/L (ref 7–177)

## 2010-12-08 LAB — BASIC METABOLIC PANEL
BUN: 5 mg/dL — ABNORMAL LOW (ref 6–23)
BUN: 5 mg/dL — ABNORMAL LOW (ref 6–23)
BUN: 7 mg/dL (ref 6–23)
BUN: 8 mg/dL (ref 6–23)
BUN: 8 mg/dL (ref 6–23)
CO2: 22 mEq/L (ref 19–32)
CO2: 26 mEq/L (ref 19–32)
CO2: 27 mEq/L (ref 19–32)
Calcium: 7.8 mg/dL — ABNORMAL LOW (ref 8.4–10.5)
Calcium: 8.7 mg/dL (ref 8.4–10.5)
Calcium: 8.7 mg/dL (ref 8.4–10.5)
Calcium: 9 mg/dL (ref 8.4–10.5)
Chloride: 102 mEq/L (ref 96–112)
Chloride: 103 mEq/L (ref 96–112)
Chloride: 99 mEq/L (ref 96–112)
Creatinine, Ser: 0.59 mg/dL (ref 0.4–1.2)
Creatinine, Ser: 0.71 mg/dL (ref 0.4–1.2)
Creatinine, Ser: 0.76 mg/dL (ref 0.4–1.2)
Creatinine, Ser: 0.84 mg/dL (ref 0.4–1.2)
Creatinine, Ser: 0.87 mg/dL (ref 0.4–1.2)
GFR calc Af Amer: >60 mL/min (ref >60)
GFR calc non Af Amer: >60 mL/min (ref >60)
GFR calc non Af Amer: >60 mL/min (ref >60)
Glucose, Bld: 124 mg/dL — ABNORMAL HIGH (ref 70–99)
Glucose, Bld: 270 mg/dL — ABNORMAL HIGH (ref 70–99)
Glucose, Bld: 282 mg/dL — ABNORMAL HIGH (ref 70–99)
Glucose, Bld: 395 mg/dL — ABNORMAL HIGH (ref 70–99)
Potassium: 4.2 mEq/L (ref 3.5–5.1)
Potassium: 4.2 mEq/L (ref 3.5–5.1)
Potassium: 4.5 mEq/L (ref 3.5–5.1)
Potassium: 4.5 mEq/L (ref 3.5–5.1)

## 2010-12-08 LAB — APTT: aPTT: 26 s (ref 24–37)

## 2010-12-08 LAB — POCT CARDIAC MARKERS: CKMB, poc: 10.4 ng/mL (ref 1.0–8.0)

## 2010-12-08 LAB — WOUND CULTURE: Gram Stain: NONE SEEN

## 2010-12-08 LAB — LIPID PANEL
Cholesterol: 129 mg/dL (ref 0–200)
LDL Cholesterol: 60 mg/dL (ref 0–99)
VLDL: 33 mg/dL (ref 0–40)

## 2010-12-08 LAB — MAGNESIUM: Magnesium: 2.2 mg/dL (ref 1.5–2.5)

## 2010-12-08 LAB — TSH: TSH: 2.918 u[IU]/mL (ref 0.350–4.500)

## 2010-12-08 LAB — BASIC METABOLIC PANEL WITH GFR
Calcium: 8.9 mg/dL (ref 8.4–10.5)
Creatinine, Ser: 0.72 mg/dL (ref 0.4–1.2)
GFR calc Af Amer: >60 mL/min (ref >60)
GFR calc non Af Amer: >60 mL/min (ref >60)
Sodium: 136 meq/L (ref 135–145)

## 2010-12-08 LAB — SAMPLE TO BLOOD BANK

## 2010-12-08 LAB — BRAIN NATRIURETIC PEPTIDE: Pro B Natriuretic peptide (BNP): 315 pg/mL — ABNORMAL HIGH (ref 0.0–100.0)

## 2010-12-08 LAB — PROTIME-INR
INR: 1 (ref 0.00–1.49)
Prothrombin Time: 12.6 seconds (ref 11.6–15.2)

## 2010-12-08 LAB — HEPARIN LEVEL (UNFRACTIONATED)
Heparin Unfractionated: 0.16 IU/mL — ABNORMAL LOW (ref 0.30–0.70)
Heparin Unfractionated: 0.75 IU/mL — ABNORMAL HIGH (ref 0.30–0.70)
Heparin Unfractionated: <0.1 IU/mL — ABNORMAL LOW (ref 0.30–0.70)

## 2010-12-08 LAB — TROPONIN I: Troponin I: 1.26 ng/mL (ref 0.00–0.06)

## 2010-12-09 LAB — CBC
MCHC: 33.8 g/dL (ref 30.0–36.0)
Platelets: 290 10*3/uL (ref 150–400)
RDW: 13.3 % (ref 11.5–15.5)

## 2010-12-09 LAB — DIFFERENTIAL
Basophils Absolute: 0 10*3/uL (ref 0.0–0.1)
Basophils Relative: 0 % (ref 0–1)
Lymphocytes Relative: 20 % (ref 12–46)
Neutro Abs: 8.2 10*3/uL — ABNORMAL HIGH (ref 1.7–7.7)
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
## 2011-01-17 ENCOUNTER — Inpatient Hospital Stay (HOSPITAL_COMMUNITY)
Admission: EM | Admit: 2011-01-17 | Discharge: 2011-01-18 | DRG: 313 | Disposition: A | Discharge status: Home / Self Care | Payer: Medicaid Other | Attending: Cardiology | Admitting: Cardiology

## 2011-01-17 ENCOUNTER — Emergency Department (HOSPITAL_COMMUNITY): Payer: Medicaid Other

## 2011-01-17 DIAGNOSIS — R5383 Other fatigue: Secondary | ICD-10-CM

## 2011-01-17 DIAGNOSIS — E282 Polycystic ovarian syndrome: Secondary | ICD-10-CM | POA: Diagnosis present

## 2011-01-17 DIAGNOSIS — D72829 Elevated white blood cell count, unspecified: Secondary | ICD-10-CM | POA: Diagnosis present

## 2011-01-17 DIAGNOSIS — E785 Hyperlipidemia, unspecified: Secondary | ICD-10-CM | POA: Diagnosis present

## 2011-01-17 DIAGNOSIS — F329 Major depressive disorder, single episode, unspecified: Secondary | ICD-10-CM | POA: Diagnosis present

## 2011-01-17 DIAGNOSIS — Z7982 Long term (current) use of aspirin: Secondary | ICD-10-CM

## 2011-01-17 DIAGNOSIS — R5381 Other malaise: Secondary | ICD-10-CM

## 2011-01-17 DIAGNOSIS — N179 Acute kidney failure, unspecified: Secondary | ICD-10-CM | POA: Diagnosis present

## 2011-01-17 DIAGNOSIS — R0789 Other chest pain: Principal | ICD-10-CM | POA: Diagnosis present

## 2011-01-17 DIAGNOSIS — F3289 Other specified depressive episodes: Secondary | ICD-10-CM | POA: Diagnosis present

## 2011-01-17 DIAGNOSIS — G4733 Obstructive sleep apnea (adult) (pediatric): Secondary | ICD-10-CM | POA: Diagnosis present

## 2011-01-17 DIAGNOSIS — E119 Type 2 diabetes mellitus without complications: Secondary | ICD-10-CM | POA: Diagnosis present

## 2011-01-17 DIAGNOSIS — I2510 Atherosclerotic heart disease of native coronary artery without angina pectoris: Secondary | ICD-10-CM | POA: Diagnosis present

## 2011-01-17 DIAGNOSIS — I10 Essential (primary) hypertension: Secondary | ICD-10-CM | POA: Diagnosis present

## 2011-01-17 DIAGNOSIS — M1990 Unspecified osteoarthritis, unspecified site: Secondary | ICD-10-CM | POA: Diagnosis present

## 2011-01-17 LAB — CBC
Hemoglobin: 17.8 g/dL — ABNORMAL HIGH (ref 12.0–15.0)
MCHC: 36.2 g/dL — ABNORMAL HIGH (ref 30.0–36.0)
RDW: 12.9 % (ref 11.5–15.5)

## 2011-01-17 LAB — POCT I-STAT, CHEM 8
BUN: 23 mg/dL (ref 6–23)
Chloride: 102 mEq/L (ref 96–112)
HCT: 54 % — ABNORMAL HIGH (ref 36.0–46.0)
Sodium: 139 mEq/L (ref 135–145)
TCO2: 25 mmol/L (ref 0–100)

## 2011-01-17 LAB — DIFFERENTIAL
Basophils Relative: 1 % (ref 0–1)
Eosinophils Absolute: 0.5 10*3/uL (ref 0.0–0.7)
Eosinophils Relative: 2 % (ref 0–5)
Lymphs Abs: 4.6 10*3/uL — ABNORMAL HIGH (ref 0.7–4.0)
Monocytes Relative: 7 % (ref 3–12)
Neutrophils Relative %: 66 % (ref 43–77)

## 2011-01-17 LAB — PROTIME-INR
INR: 0.93 (ref 0.00–1.49)
Prothrombin Time: 12.7 seconds (ref 11.6–15.2)

## 2011-01-17 LAB — POCT CARDIAC MARKERS
Myoglobin, poc: 409 ng/mL (ref 12–200)
Troponin i, poc: <0.05 ng/mL (ref 0.00–0.09)

## 2011-01-18 ENCOUNTER — Other Ambulatory Visit: Payer: Medicaid Other | Admitting: *Deleted

## 2011-01-18 DIAGNOSIS — R079 Chest pain, unspecified: Secondary | ICD-10-CM

## 2011-01-18 LAB — CARDIAC PANEL(CRET KIN+CKTOT+MB+TROPI)
CK, MB: 4.1 ng/mL — ABNORMAL HIGH (ref 0.3–4.0)
Relative Index: 2.2 (ref 0.0–2.5)
Relative Index: 2.2 (ref 0.0–2.5)

## 2011-01-18 LAB — GLUCOSE, CAPILLARY
Glucose-Capillary: 306 mg/dL — ABNORMAL HIGH (ref 70–99)
Glucose-Capillary: 302 mg/dL — ABNORMAL HIGH (ref 70–99)

## 2011-01-18 LAB — LIPID PANEL
Cholesterol: 156 mg/dL (ref 0–200)
LDL Cholesterol: 73 mg/dL (ref 0–99)

## 2011-01-18 LAB — TSH: TSH: 2.114 u[IU]/mL (ref 0.350–4.500)

## 2011-01-18 LAB — COMPREHENSIVE METABOLIC PANEL
ALT: 16 U/L (ref 0–35)
Albumin: 2.7 g/dL — ABNORMAL LOW (ref 3.5–5.2)
Alkaline Phosphatase: 97 U/L (ref 39–117)
Potassium: 4.7 mEq/L (ref 3.5–5.1)
Sodium: 132 mEq/L — ABNORMAL LOW (ref 135–145)
Total Protein: 6.4 g/dL (ref 6.0–8.3)

## 2011-01-18 LAB — CBC
MCH: 29.3 pg (ref 26.0–34.0)
MCV: 85.9 fL (ref 78.0–100.0)
Platelets: 284 10*3/uL (ref 150–400)
RBC: 4.82 MIL/uL (ref 3.87–5.11)
RDW: 13.2 % (ref 11.5–15.5)

## 2011-02-06 ENCOUNTER — Encounter: Payer: Self-pay | Admitting: Cardiovascular Disease

## 2011-02-22 ENCOUNTER — Telehealth: Payer: Self-pay | Admitting: Cardiology

## 2011-02-22 ENCOUNTER — Other Ambulatory Visit: Payer: Self-pay | Admitting: Cardiovascular Disease

## 2011-02-22 ENCOUNTER — Other Ambulatory Visit (INDEPENDENT_AMBULATORY_CARE_PROVIDER_SITE_OTHER): Payer: Medicaid Other | Admitting: *Deleted

## 2011-02-22 DIAGNOSIS — E785 Hyperlipidemia, unspecified: Secondary | ICD-10-CM

## 2011-02-22 LAB — HEPATIC FUNCTION PANEL
ALT: 21 U/L (ref 0–35)
Bilirubin, Direct: 0.1 mg/dL (ref 0.0–0.3)
Total Bilirubin: 0.5 mg/dL (ref 0.3–1.2)

## 2011-02-22 LAB — LIPID PANEL
HDL: 50.6 mg/dL (ref >39.00)
Total CHOL/HDL Ratio: 5
Triglycerides: 220 mg/dL — ABNORMAL HIGH (ref 0.0–149.0)
VLDL: 44 mg/dL — ABNORMAL HIGH (ref 0.0–40.0)

## 2011-02-22 LAB — LDL CHOLESTEROL, DIRECT: Direct LDL: 135.9 mg/dL

## 2011-02-22 NOTE — Telephone Encounter (Signed)
Order put in for fasting labs

## 2011-02-27 ENCOUNTER — Ambulatory Visit (INDEPENDENT_AMBULATORY_CARE_PROVIDER_SITE_OTHER): Payer: Medicaid Other | Admitting: Cardiovascular Disease

## 2011-02-27 ENCOUNTER — Encounter: Payer: Self-pay | Admitting: Cardiovascular Disease

## 2011-02-27 DIAGNOSIS — I2510 Atherosclerotic heart disease of native coronary artery without angina pectoris: Secondary | ICD-10-CM

## 2011-02-27 DIAGNOSIS — I10 Essential (primary) hypertension: Secondary | ICD-10-CM

## 2011-02-27 NOTE — Assessment & Plan Note (Signed)
Well-controlled on current medical program. 

## 2011-02-27 NOTE — Assessment & Plan Note (Signed)
The patient is stable without angina. She will continue on dual antiplatelet therapy and secondary risk reduction measures with the use of quinapril, simvastatin, and metoprolol.

## 2011-02-27 NOTE — Progress Notes (Signed)
HPI:  This is a 50 year old woman presenting for followup evaluation. The patient has morbid obesity, diabetes, and complex coronary disease. She underwent bifurcation stenting of the LAD/diagonal in 2010. She has been maintained on long-term dual antiplatelet therapy with aspirin and effient. The patient is allergic to Plavix. The patient was recently hospitalized with weakness and acute renal insufficiency related to diuretic use. She recovered with discontinuation of Aldactone and IV fluid hydration.  She feels that she is back to her baseline level of health. She denies exertional chest pain or pressure. She has chronic dyspnea. She denies leg swelling, palpitations, lightheadedness, or syncope. She has become intolerant to nonsteroidal anti-inflammatories because of breathing difficulty with these medicines. She has been unable to obtain good pain control with other meds. The patient has chronic knee pain related to osteoarthritis.  Outpatient Encounter Prescriptions as of 02/27/2011  Medication Sig Dispense Refill  . aspirin (ASPIR-81) 81 MG EC tablet Take 81 mg by mouth daily.        . insulin detemir (LEVEMIR FLEXPEN) 100 UNIT/ML injection Inject into the skin. 60 units twice daily       . insulin lispro (HUMALOG) 100 UNIT/ML injection Inject 30 Units into the skin 3 (three) times daily before meals. 10 units three times a day      . Isosorbide Mononitrate (IMDUR PO) Take 10 mg by mouth daily.        . metoprolol tartrate (LOPRESSOR) 25 MG tablet Take 25 mg by mouth 2 (two) times daily.        . nitroGLYCERIN (NITROSTAT) 0.4 MG SL tablet Place 0.4 mg under the tongue as needed.        . prasugrel (EFFIENT) 10 MG TABS Take by mouth daily.        . quinapril (ACCUPRIL) 40 MG tablet Take 40 mg by mouth daily.       . simvastatin (ZOCOR) 40 MG tablet Take 40 mg by mouth daily.        Marland Kitchen DISCONTD: desvenlafaxine (PRISTIQ) 50 MG 24 hr tablet Take 50 mg by mouth daily.        Marland Kitchen DISCONTD: isosorbide  mononitrate (IMDUR) 30 MG 24 hr tablet Take 30 mg by mouth daily.          Allergies  Allergen Reactions  . Clopidogrel Bisulfate     REACTION: rash    Past Medical History  Diagnosis Date  . Obesity   . Diabetes mellitus   . Sleep apnea   . Hypertension   . CAD (coronary artery disease)     s/p nstemi 05/2009 treated w drug-eluting stents    ROS: Negative except as per HPI  BP 110/78  Pulse 80  Ht 5\' 2"  (1.575 m)  Wt 280 lb 12.8 oz (127.37 kg)  BMI 51.36 kg/m2  PHYSICAL EXAM: Pt is alert and oriented, morbidly obese woman in NAD HEENT: normal Neck: JVP - normal, carotids 2+= without bruits Lungs: CTA bilaterally CV: RRR without murmur or gallop Abd: soft, NT, Positive BS, no hepatomegaly Ext: no C/C/E, distal pulses intact and equal Skin: warm/dry no rash  EKG:  Normal sinus rhythm with first degree AV block, heart rate 63 beats per minute, within normal limits  ASSESSMENT AND PLAN:

## 2011-02-27 NOTE — Patient Instructions (Signed)
Your physician wants you to follow-up in: 6 months with Dr. Cooper.  You will receive a reminder letter in the mail two months in advance. If you don't receive a letter, please call our office to schedule the follow-up appointment.   

## 2011-02-28 ENCOUNTER — Telehealth: Payer: Self-pay | Admitting: *Deleted

## 2011-02-28 DIAGNOSIS — E785 Hyperlipidemia, unspecified: Secondary | ICD-10-CM

## 2011-02-28 MED ORDER — ATORVASTATIN CALCIUM 80 MG PO TABS: 80.0000 mg | ORAL_TABLET | Freq: Every day | ORAL | Status: DC

## 2011-02-28 NOTE — Progress Notes (Signed)
Addended by: Deveron Furlong on: 02/28/2011 09:47 AM   Modules accepted: Orders

## 2011-02-28 NOTE — Telephone Encounter (Signed)
Notified pt of results of lab work--she is taking simvastatin and will d/c and start atorvastatin--RX sent to [facility]--[address] --nt

## 2011-03-15 NOTE — Discharge Summary (Signed)
Brenda [name], [place]              ACCOUNT NO.:  [id number]  MEDICAL RECORD NO.:  [id number]           PATIENT TYPE:  I  LOCATION:  2039                         FACILITY:  [facility]  PHYSICIAN:  Veverly Fells. Excell Seltzer, MD  DATE OF BIRTH:  Dec 07, 1960  DATE OF ADMISSION:  01/17/2011 DATE OF DISCHARGE:  01/18/2011                              DISCHARGE SUMMARY   PRIMARY CARDIOLOGIST:  Veverly Fells. Excell Seltzer, MD  PRIMARY CARE PROVIDER:  Dr. Lerry Liner  DISCHARGE DIAGNOSIS:  Chest pain without objective evidence of ischemia.  SECONDARY DIAGNOSES: 1. Coronary artery disease, status post prior stenting of the left     anterior descending and diagonal in September 2010. 2. Prerenal azotemia, now resolved. 3. Hypertension with hypotension during this admission. 4. Hyperlipidemia. 5. Diabetes mellitus. 6. Morbid obesity. 7. Obstructive sleep apnea, on continuous positive airway pressure at     home. 8. Polycystic ovarian disease. 9. Arthritis. 10.Depression. 11.Status post cholecystectomy.  ALLERGIES:  PLAVIX.  PROCEDURES:  None.  HISTORY OF PRESENT ILLNESS:  This is a 50 year old female with above complex problem list who has recently been treated with Naprosyn for ongoing back and arthritic pain.  There was some concern that this was causing volume excess and she recently saw her primary care provider late last week.  At that point, Naprosyn was discontinued and the patient was placed on spirolactone 25 mg b.i.d. to assist diuresis. Unfortunately, over the weekend, the patient developed recurrent back pain existing between her shoulder blades along with symptoms of diarrhea and even nausea with vomiting episode on the day of admission. Further, she felt fairly weak and noted poor p.o. intake.  Because of her back pain, she presented to the [facility] ED where she was found to have an elevated creatinine of 1.8 with a BUN of 23.  She had a white blood cell count of 19.0.  She was  initially hypotensive in the ED, but this responded quickly to IV fluids.  She was admitted for further evaluation.  HOSPITAL COURSE:  The patient's chest pain resolved in the ED simply with the initiation of IV fluids.  Blood pressure was initially too low to initiate nitrate throughout the therapies.  We actually held all of her antihypertensives from home and hydrated her aggressively with significant improvement in creatinine from 1.8 on admission to 0.92 this morning.  Her white count also came down to 11.7.  Chest x-ray showed no active disease to account for leukocytosis and it was felt that this was likely reactive.  She has had no recurrent chest pain and we plan to discharge her today.  We have discontinued her spirolactone and have asked to resume her ACE inhibitor in 2 days.  She will touch base with Dr. Mayford Knife regarding further therapy for her arthritic/back pain with consideration of tramadol.  DISCHARGE LABORATORY DATA:  Hemoglobin 14.1, hematocrit 41.4, WBC 11.7, and platelets 284.  Sodium 132, potassium 4.7, chloride 100, CO2 of 21, BUN 23, creatinine 0.92, glucose 339, total bilirubin 0.5, alkaline phosphatase 97, AST 22, ALT 16, total protein 6.4, albumin 2.7, calcium 8.6.  CK 189, MB 4.1, and troponin  I less than 0.30.  Total cholesterol 156, triglycerides 229, HDL 37, and LDL 73.  TSH 2.114.  DISPOSITION:  The patient will be discharged home today in good condition.  FOLLOWUP PLANS AND APPOINTMENTS:  The patient has a followup scheduled with Dr. Excell Seltzer on February 27, 2011, at 3 p.m.  She will follow up with Dr. Lerry Liner as previously scheduled.  DISCHARGE MEDICATIONS: 1. Nitroglycerin 0.4 mg sublingual p.r.n. chest pain. 2. Aspirin 81 mg daily. 3. Effient 10 mg daily. 4. Humalog 30 units t.i.d. with meals. 5. Isosorbide dinitrate 30 mg daily. 6. Levemir 60 units b.i.d. 7. Metformin 5 mg b.i.d. 8. Metoprolol tartrate 25 mg b.i.d. 9. Quinapril 40 mg  daily to be resumed on Jan 20, 2011. 10.Zocor 40 mg every night.  OUTSTANDING LABORATORY DATA AND STUDIES:  None.  DURATION OF DISCHARGE ENCOUNTER:  35 minutes including physician time.     Nicolasa Ducking, ANP   ______________________________ Veverly Fells. Excell Seltzer, MD    CB/MEDQ  D:  01/18/2011  T:  01/19/2011  Job:  [id number]  cc:   Jone Baseman. Mayford Knife, MD  Electronically Signed by Nicolasa Ducking ANP on 01/25/2011 05:09:57 PM Electronically Signed by Tonny Bollman MD on 03/15/2011 12:32:44 PM

## 2011-04-02 NOTE — H&P (Signed)
[name], [name]              ACCOUNT NO.:  [id number]  MEDICAL RECORD NO.:  [id number]           PATIENT TYPE:  I  LOCATION:  2039                         FACILITY:  [facility]  PHYSICIAN:  Colleen Can. Deborah Chalk, M.D.DATE OF BIRTH:  July 15, 1961  DATE OF ADMISSION:  01/17/2011 DATE OF DISCHARGE:                             HISTORY & PHYSICAL   PRIMARY CARDIOLOGIST:  Veverly Fells. Excell Seltzer, MD  PATIENT PROFILE:  A 50 year old female with prior history of CAD status post LAD and diagonal bifurcational stenting in 2010 who presents with a 1-day history of mid-scapular back pain as well as vague complaints of intermittent diaphoresis, weakness, and fatigue.  PROBLEMS: 1. Coronary artery disease/back pain.     a.     Status post prior percutaneous transluminal coronary      angioplasty performed at [facility] in summer of 2010.     b.     Status post percutaneous coronary intervention and stenting      of the left anterior descending (coronary) artery and ostial      diagonal with Xience drug-eluting stent in each location and      kissing balloon angioplasty following. 2. Hypertension. 3. Hyperlipidemia. 4. Diabetes mellitus. 5. Morbid obesity. 6. Obstructive sleep apnea on CPAP at home. 7. Polycystic ovarian disease. 8. Arthritis. 9. Depression. 10.Status post cholecystectomy.  ALLERGIES:  PLAVIX.  HISTORY OF PRESENT ILLNESS:  A 50 year old female with the above complex problem list who was in her usual state of health until recently when secondary to ongoing back pain which was felt to be arthritic she is being treated with Naprosyn.  It was felt that Naprosyn was resulting in volume excess and last Friday she was placed on Lasix and Naprosyn was discontinued.  Over the weekend, the patient did have good response from Lasix with diuresis with starting yesterday she began to experience recurrent upper back pain as well as vague symptoms of weakness, fatigue, and  intermittent diaphoresis.  Symptoms have persisted and her back pain which is mid scapular and similar to previous anginal equivalent has not gone away since yesterday.  As a result, she presented to the ED today.  Initially, she complained of 9/10 mid- scapular back pain and she was found to be hypotensive with pressures in the high 70s to 80s.  She is currently receiving a fluid bolus.  With IV fluids her back pain has completely resolved.  She is currently in no acute distress.  Of note, her creatinine is elevated at 1.8 and her white blood cell count is also elevated at 19,000.  The patient says she has trouble differentiating the back pain from arthritis and prior angina and also feels similar to how she fell when she was admitted with gastroenteritis last summer.  Of note, she did have 1 episode of nausea and vomiting here in the ED.  Of note, the patient was placed on spirolactone last Friday and not Lasix.  HOME MEDICATIONS:  Aldactone, Effient, Glucophage, Humalog, Imdur, Levemir, quinapril, Zocor.  FAMILY HISTORY:  She has a history of congenital valvular disease.  Both parents had history of MI.  SOCIAL  HISTORY:  The patient lives in [place] with her daughter.  She smoked maybe about 1 year that was at a young age.  REVIEW OF SYSTEMS:  Positive for mid-scapular back pain, chronic arthralgias with chronic back pain.  She has had nausea with intermittent diarrhea as well as an episode of vomiting.  She had intermittent diaphoresis.  She is a full code.  Otherwise, all systems reviewed is negative.  PHYSICAL EXAMINATION:  VITAL SIGNS:  Temperature 97.0, heart rate 92, respiratory rate 18, blood pressure 173/41, pulse ox 97% on 2 L. GENERAL:  Pleasant white female in no acute distress.  Awake, alert, and oriented x3.  She has a normal affect. HEENT:  Normal.  Nares grossly intact, nonfocal. SKIN:  Warm, dry without lesions or masses. NECK:  Obese and supple.  Difficult  to assess JVP. LUNGS:  Respirations are unlabored.  Clear to auscultation. CARDIAC:  Regular S1, S2.  No S3, S4, or murmurs. ABDOMEN:  Obese, soft, nontender, nondistended.  Bowel sounds present x4. EXTREMITIES:  Warm, dry, pink.  No clubbing, cyanosis or edema. Dorsalis pedis and posterior tibial pulses are 1+ and equal bilaterally.  Chest x-ray is pending.  EKG shows sinus rhythm with a right bundle- branch block.  No acute ST-T changes.  LABORATORY WORK:  Hemoglobin 17.8, hematocrit 49.2, WBC 19.0, platelets 436.  Sodium 139, potassium 3.2, chloride 102, CO2 25, BUN 23, creatinine 1.8, glucose 79.  Point-of-care markers negative.  ASSESSMENT AND PLAN: 1. Back pain/? unstable angina/coronary artery disease.  The patient     presents with a 1-day history of ongoing back pain that was     relieved with intravenous fluids here in the emergency department.     She says it was similar to prior angina.  I saw the patient's     symptoms including weakness, fatigue and also diaphoresis.  She     says they are more in line with what she has experienced in the     past with gallbladder attacks (status post cholecystectomy) as well     as with her episode of gastritis last summer.  Of note,     she does have an elevated white count and does appear dehydrated by     chemistry.  Therefore, we will plan to admit and cycle enzymes.  We     will hydrate well tonight and follow her renal function.  We will     repeat 2-D echocardiogram to evaluate left ventricular function.     As symptoms are similar to prior anginal consider catheterization     though as symptoms persisted for greater than 24 hours and enzymes     are negative this should be somewhat reassuring.  Doubt aortic     dissection and would hold off on a contrast study at this time in     the setting of elevated creatinine.  The patient is currently pain-     free. 2. Hypertension.  Currently, the patient is hypotensive.  We will  hold     all her antihypertensives. 3. Diabetes mellitus.  Hold the Glucophage and sliding scale insulin     as she has likely received contrast during this admission,     currently has renal dysfunction. 4. Acute renal failure.  Hydrate. 5. Leukocytosis, question etiology.  Followup chest x-ray and check a     UA. 6. Obstructive sleep apnea.  Continue CPAP per Respiratory.     Nicolasa Ducking, ANP   ______________________________ Duffy Rhody  Roslynn Amble, M.D.    CB/MEDQ  D:  01/17/2011  T:  01/18/2011  Job:  [id number]  Electronically Signed by Nicolasa Ducking ANP on 01/29/2011 07:45:52 PM Electronically Signed by Roger Shelter M.D. on 04/02/2011 11:15:01 AM

## 2011-09-02 ENCOUNTER — Other Ambulatory Visit: Payer: Self-pay | Admitting: Cardiovascular Disease

## 2011-09-11 ENCOUNTER — Other Ambulatory Visit: Payer: Self-pay | Admitting: *Deleted

## 2011-09-11 MED ORDER — PRASUGREL HCL 10 MG PO TABS: 10.0000 mg | ORAL_TABLET | Freq: Every day | ORAL | Status: DC

## 2011-10-03 ENCOUNTER — Ambulatory Visit: Payer: Medicaid Other | Admitting: Cardiovascular Disease

## 2011-10-18 ENCOUNTER — Encounter: Payer: Self-pay | Admitting: Cardiovascular Disease

## 2011-10-18 ENCOUNTER — Ambulatory Visit (INDEPENDENT_AMBULATORY_CARE_PROVIDER_SITE_OTHER): Payer: Medicaid Other | Admitting: Cardiovascular Disease

## 2011-10-18 DIAGNOSIS — I2510 Atherosclerotic heart disease of native coronary artery without angina pectoris: Secondary | ICD-10-CM

## 2011-10-18 DIAGNOSIS — E785 Hyperlipidemia, unspecified: Secondary | ICD-10-CM

## 2011-10-18 DIAGNOSIS — I10 Essential (primary) hypertension: Secondary | ICD-10-CM

## 2011-10-18 NOTE — Assessment & Plan Note (Signed)
The patient is stable without angina. I reviewed her cardiac cath images and she underwent bifurcation stenting with a good result. She remains at higher risk of late complications and I would favor continuing her on dual antiplatelet therapy with aspirin and effient. She will followup in 6 months.

## 2011-10-18 NOTE — Progress Notes (Signed)
HPI:  This is a 51 year old woman presenting for followup evaluation. She has a history of coronary artery disease and presented with non-ST elevation MI in 2000 and. She underwent bifurcation stenting of the LAD and first diagonal. She's been maintained on long-term dual antiplatelet therapy with aspirin and effient. The patient is allergic to Plavix.  The patient is without complaints today. She try to take a different nonsteroidal recently and again was intolerant because of worsening edema with associated shortness of breath. She's been on 2 different nonsteroidal anti-inflammatories had a similar reaction to both. She denies chest pain or chest pressure. She denies orthopnea or PND. She's tried to make some positive  dietary changes.  Outpatient Encounter Prescriptions as of 10/18/2011  Medication Sig Dispense Refill  . aspirin (ASPIR-81) 81 MG EC tablet Take 81 mg by mouth daily.        Marland Kitchen atorvastatin (LIPITOR) 80 MG tablet Take 1 tablet (80 mg total) by mouth daily.  30 tablet  11  . insulin detemir (LEVEMIR FLEXPEN) 100 UNIT/ML injection Inject into the skin. 60 units twice daily       . isosorbide mononitrate (IMDUR) 30 MG 24 hr tablet TAKE ONE TABLET BY MOUTH DAILY  30 tablet  7  . metoprolol tartrate (LOPRESSOR) 25 MG tablet TAKE 1 TABLET TWICE A DAY  60 tablet  5  . nitroGLYCERIN (NITROSTAT) 0.4 MG SL tablet Place 0.4 mg under the tongue as needed.        . prasugrel (EFFIENT) 10 MG TABS Take 1 tablet (10 mg total) by mouth daily.  30 tablet  5  . quinapril (ACCUPRIL) 40 MG tablet Take 40 mg by mouth daily.       . traMADol (ULTRAM) 50 MG tablet Take 50 mg by mouth every 6 (six) hours as needed.      Marland Kitchen DISCONTD: insulin lispro (HUMALOG) 100 UNIT/ML injection Inject 30 Units into the skin 3 (three) times daily before meals. 10 units three times a day      . DISCONTD: Isosorbide Mononitrate (IMDUR PO) Take 10 mg by mouth daily.          Allergies  Allergen Reactions  . Clopidogrel  Bisulfate     REACTION: rash    Past Medical History  Diagnosis Date  . Obesity   . Diabetes mellitus   . Sleep apnea   . Hypertension   . CAD (coronary artery disease)     s/p nstemi 05/2009 treated w drug-eluting stents    ROS: Negative except as per HPI  BP 116/70  Pulse 85  Ht 5\' 2"  (1.575 m)  Wt 131.543 kg (290 lb)  BMI 53.04 kg/m2  PHYSICAL EXAM: Pt is alert and oriented, obese woman in NAD HEENT: normal Neck: JVP - normal, carotids 2+= without bruits Lungs: CTA bilaterally CV: RRR without murmur or gallop Abd: soft, NT, Positive BS, obese Ext: 1+ diffuse leg edema bilaterally, distal pulses intact and equal Skin: warm/dry no rash  EKG:  Normal sinus rhythm 85 beats per minute, right bundle branch block.  No significant change from previous tracing Jan 20, 2011.  ASSESSMENT AND PLAN:

## 2011-10-18 NOTE — Assessment & Plan Note (Signed)
Lipids reviewed she has been above goal. She was changed several months ago to Lipitor 80 mg. I would recommend following up a lipid panel LFTs and this will be ordered.

## 2011-10-18 NOTE — Patient Instructions (Signed)
Your physician recommends that you return for a FASTING LIPID and LIVER Profile--nothing to eat or drink after midnight, lab opens at 8:30  Your physician wants you to follow-up in: 6 MONTHS. You will receive a reminder letter in the mail two months in advance. If you don't receive a letter, please call our office to schedule the follow-up appointment.  Your physician recommends that you continue on your current medications as directed. Please refer to the Current Medication list given to you today.

## 2011-10-24 ENCOUNTER — Other Ambulatory Visit: Payer: Medicaid Other

## 2011-10-25 ENCOUNTER — Other Ambulatory Visit (INDEPENDENT_AMBULATORY_CARE_PROVIDER_SITE_OTHER): Payer: Medicaid Other

## 2011-10-25 DIAGNOSIS — E785 Hyperlipidemia, unspecified: Secondary | ICD-10-CM

## 2011-10-25 DIAGNOSIS — I2510 Atherosclerotic heart disease of native coronary artery without angina pectoris: Secondary | ICD-10-CM

## 2011-10-25 LAB — LIPID PANEL
Cholesterol: 157 mg/dL (ref 0–200)
HDL: 47.7 mg/dL (ref >39.00)
LDL Cholesterol: 84 mg/dL (ref 0–99)
Triglycerides: 126 mg/dL (ref 0.0–149.0)
VLDL: 25.2 mg/dL (ref 0.0–40.0)

## 2011-10-25 LAB — HEPATIC FUNCTION PANEL
ALT: 15 U/L (ref 0–35)
Albumin: 3.5 g/dL (ref 3.5–5.2)
Total Protein: 7.3 g/dL (ref 6.0–8.3)

## 2012-03-04 ENCOUNTER — Other Ambulatory Visit: Payer: Self-pay | Admitting: Cardiovascular Disease

## 2012-05-20 ENCOUNTER — Ambulatory Visit: Payer: Medicaid Other | Admitting: Cardiovascular Disease

## 2012-06-10 ENCOUNTER — Other Ambulatory Visit: Payer: Self-pay | Admitting: Cardiovascular Disease

## 2012-09-11 ENCOUNTER — Ambulatory Visit (INDEPENDENT_AMBULATORY_CARE_PROVIDER_SITE_OTHER): Payer: Medicaid Other | Admitting: Cardiovascular Disease

## 2012-09-11 ENCOUNTER — Encounter: Payer: Self-pay | Admitting: Cardiovascular Disease

## 2012-09-11 VITALS — BP 130/86 | HR 80 | Ht 62.0 in | Wt 294.0 lb

## 2012-09-11 DIAGNOSIS — I10 Essential (primary) hypertension: Secondary | ICD-10-CM

## 2012-09-11 DIAGNOSIS — E785 Hyperlipidemia, unspecified: Secondary | ICD-10-CM

## 2012-09-11 MED ORDER — ISOSORBIDE MONONITRATE ER 30 MG PO TB24: 30.0000 mg | ORAL_TABLET | Freq: Every day | ORAL | Status: DC

## 2012-09-11 MED ORDER — PRASUGREL HCL 10 MG PO TABS: 10.0000 mg | ORAL_TABLET | Freq: Every day | ORAL | Status: DC

## 2012-09-11 MED ORDER — NITROGLYCERIN 0.4 MG SL SUBL: 0.4000 mg | SUBLINGUAL_TABLET | SUBLINGUAL | Status: DC | PRN

## 2012-09-11 MED ORDER — METOPROLOL TARTRATE 25 MG PO TABS: 25.0000 mg | ORAL_TABLET | Freq: Two times a day (BID) | ORAL | Status: DC

## 2012-09-11 MED ORDER — ATORVASTATIN CALCIUM 80 MG PO TABS: 80.0000 mg | ORAL_TABLET | Freq: Every day | ORAL | Status: DC

## 2012-09-11 NOTE — Progress Notes (Signed)
HPI:  This is a 52 year old woman presenting for followup evaluation. She has a history of coronary artery disease and presented with non-ST elevation MI in 2010 and she underwent bifurcation stenting of the LAD and first diagonal. She's been maintained on long-term dual antiplatelet therapy with aspirin and effient. The patient is allergic to Plavix. Her last lipids were checked in February 2013 she had a cholesterol 157, triglycerides 126, HDL 48, and LDL 84. Her liver function tests were within normal limits.  The patient is doing well from a cardiac standpoint. She denies chest pain, chest pressure, dyspnea, or palpitations. She's been compliant with her medications. She has no complaints today.  Outpatient Encounter Prescriptions as of 09/11/2012  Medication Sig Dispense Refill  . aspirin (ASPIR-81) 81 MG EC tablet Take 81 mg by mouth daily.        Marland Kitchen atorvastatin (LIPITOR) 80 MG tablet TAKE 1 TABLET EVERY DAY  30 tablet  5  . celecoxib (CELEBREX) 200 MG capsule Take 200 mg by mouth daily.      . insulin detemir (LEVEMIR FLEXPEN) 100 UNIT/ML injection Inject into the skin. 60 units twice daily       . isosorbide mononitrate (IMDUR) 30 MG 24 hr tablet TAKE ONE TABLET BY MOUTH DAILY  30 tablet  7  . metoprolol tartrate (LOPRESSOR) 25 MG tablet TAKE 1 TABLET TWICE A DAY  60 tablet  5  . nitroGLYCERIN (NITROSTAT) 0.4 MG SL tablet Place 0.4 mg under the tongue as needed.        . prasugrel (EFFIENT) 10 MG TABS Take 1 tablet (10 mg total) by mouth daily.  30 tablet  5  . quinapril (ACCUPRIL) 40 MG tablet Take 40 mg by mouth daily.       . [DISCONTINUED] EFFIENT 10 MG TABS TAKE 1 TABLET BY MOUTH DAILY  30 tablet  2  . [DISCONTINUED] traMADol (ULTRAM) 50 MG tablet Take 50 mg by mouth every 6 (six) hours as needed.        Allergies  Allergen Reactions  . Clopidogrel Bisulfate     REACTION: rash    Past Medical History  Diagnosis Date  . Obesity   . Diabetes mellitus   . Sleep apnea   .  Hypertension   . CAD (coronary artery disease)     s/p nstemi 05/2009 treated w drug-eluting stents    ROS: Negative except as per HPI  BP 130/86  Pulse 80  Ht 5\' 2"  (1.575 m)  Wt 133.358 kg (294 lb)  BMI 53.77 kg/m2  PHYSICAL EXAM: Pt is alert and oriented, pleasant morbidly obese woman in NAD HEENT: normal Neck: JVP - normal, carotids 2+= without bruits Lungs: CTA bilaterally CV: RRR without murmur or gallop Abd: soft, NT, obese Ext: 1+ pretibial edema bilaterally, distal pulses intact and equal Skin: warm/dry no rash  EKG:  Normal sinus rhythm 79 beats per minute, right bundle branch block.  ASSESSMENT AND PLAN: 1. CAD, native vessel. This patient has a Plavix allergy. She's had bifurcation stenting of the LAD and first diagonal. I would recommend continuing her current medical program which includes aspirin 81 mg and effient 10 mg. She will return in one year for followup.  2. Hypertension: She is on an ACE inhibitor in the setting of diabetes. Blood pressure is controlled.  3. Hyperlipidemia. Lipids from last year were reviewed. Her liver function testing was normal. She will have labs again in February and then she should return for followup in  1 year.  No medication changes were made today. I will see her back in one year for followup.  Tonny Bollman 09/11/2012 11:28 AM

## 2012-09-11 NOTE — Patient Instructions (Addendum)
Your physician wants you to follow-up in: 1 year with Dr. Excell Seltzer. You will receive a reminder letter in the mail two months in advance. If you don't receive a letter, please call our office to schedule the follow-up appointment.  Your physician recommends that you return for FASTING lab work in: 1 month.  Lipid and Liver panel.

## 2012-10-13 ENCOUNTER — Other Ambulatory Visit: Payer: Medicaid Other

## 2012-12-02 HISTORY — PX: CARDIAC CATHETERIZATION: SHX172

## 2012-12-08 ENCOUNTER — Telehealth: Payer: Self-pay | Admitting: Cardiovascular Disease

## 2012-12-08 NOTE — Telephone Encounter (Signed)
New problem    Was advise from PCP to call the office speak with nurse    C/O a lot of sob & fatigue . Retaining fluids.

## 2012-12-08 NOTE — Telephone Encounter (Signed)
I spoke with the pt and she saw her PCP this morning and they recommended that she contact cardiology for an appointment.  The pt is experiencing increased episodes of SOB, fatigue and retaining fluid.  The pt states both legs are swollen but the left leg is worse than the right. The pt is also having left arm and hand swelling, numbness and tingling.  The pt did have some chest discomfort last week and took one SL NTG.  At this time the pt cannot pin point if she was really having chest pain.  The pt is scheduled to have a CXR in the morning and the PCP gave her a Rx for a fluid pill.  The pt is unsure of medication because she has not picked it up from the pharmacy. I made the pt aware that I would like to get her into the office this week for evaluation.  At this time I do not have any availability.  I made the pt aware that I will contact her with an appointment when one becomes available.  Pt agreed with plan.

## 2012-12-09 NOTE — Telephone Encounter (Signed)
Dr Excell Seltzer reviewed note and the pt can be scheduled for appointment in the next 2 weeks.  I scheduled appointment on 12/16/12 and pt aware.

## 2012-12-16 ENCOUNTER — Ambulatory Visit (INDEPENDENT_AMBULATORY_CARE_PROVIDER_SITE_OTHER): Payer: Medicaid Other | Admitting: Cardiovascular Disease

## 2012-12-16 ENCOUNTER — Encounter: Payer: Self-pay | Admitting: Cardiovascular Disease

## 2012-12-16 ENCOUNTER — Encounter: Payer: Self-pay | Admitting: *Deleted

## 2012-12-16 VITALS — BP 126/56 | HR 75 | Ht 62.0 in | Wt 300.0 lb

## 2012-12-16 DIAGNOSIS — I2510 Atherosclerotic heart disease of native coronary artery without angina pectoris: Secondary | ICD-10-CM

## 2012-12-16 LAB — BASIC METABOLIC PANEL
Calcium: 8.6 mg/dL (ref 8.4–10.5)
GFR: 88.43 mL/min (ref >60.00)
Sodium: 138 mEq/L (ref 135–145)

## 2012-12-16 LAB — PROTIME-INR
INR: 1.1 ratio — ABNORMAL HIGH (ref 0.8–1.0)
Prothrombin Time: 11.5 s (ref 10.2–12.4)

## 2012-12-16 LAB — CBC WITH DIFFERENTIAL/PLATELET
Basophils Absolute: 0 10*3/uL (ref 0.0–0.1)
Basophils Relative: 0.5 % (ref 0.0–3.0)
Hemoglobin: 14.6 g/dL (ref 12.0–15.0)
Lymphocytes Relative: 28.6 % (ref 12.0–46.0)
Monocytes Relative: 5.7 % (ref 3.0–12.0)
Neutro Abs: 6 10*3/uL (ref 1.4–7.7)
RBC: 4.97 Mil/uL (ref 3.87–5.11)
RDW: 14 % (ref 11.5–14.6)

## 2012-12-16 MED ORDER — FUROSEMIDE 20 MG PO TABS: 20.0000 mg | ORAL_TABLET | Freq: Every day | ORAL | Status: DC

## 2012-12-16 NOTE — Progress Notes (Signed)
 HPI:  52-year-old woman presenting for followup evaluation. The patient has coronary artery disease and she presented with non-ST elevation MI in 2010 and underwent complex bifurcation stenting of the LAD and first diagonal. She's been maintained on long-term dual antiplatelet therapy with aspirin and effient. She has an allergy to Plavix. She has developed progressive shortness of breath over the last month. She complains of associated chest tightness. She is short of breath with low-level activity such as walking less than 25-50 feet. She has taken nitroglycerin with some relief. She denies resting chest pain or pressure. She's had more swelling and weight gain. She complains of chronic orthopnea and now has some episodes of PND. She denies lightheadedness or syncope. She's had no recent bleeding problems. She's been compliant with her medications.  Outpatient Encounter Prescriptions as of 12/16/2012  Medication Sig Dispense Refill  . aspirin (ASPIR-81) 81 MG EC tablet Take 81 mg by mouth daily.        . atorvastatin (LIPITOR) 80 MG tablet Take 1 tablet (80 mg total) by mouth daily.  90 tablet  3  . celecoxib (CELEBREX) 200 MG capsule Take 200 mg by mouth daily.      . insulin detemir (LEVEMIR FLEXPEN) 100 UNIT/ML injection Inject into the skin. 60 units twice daily       . Insulin Lispro Prot & Lispro (HUMALOG MIX 50/50 [place]) Inject into the skin. 30 units into skin o skin 30 units before meals      . isosorbide mononitrate (IMDUR) 30 MG 24 hr tablet Take 1 tablet (30 mg total) by mouth daily.  90 tablet  3  . metoprolol tartrate (LOPRESSOR) 25 MG tablet Take 1 tablet (25 mg total) by mouth 2 (two) times daily.  180 tablet  3  . nitroGLYCERIN (NITROSTAT) 0.4 MG SL tablet Place 1 tablet (0.4 mg total) under the tongue as needed.  25 tablet  5  . prasugrel (EFFIENT) 10 MG TABS Take 1 tablet (10 mg total) by mouth daily.  90 tablet  3  . quinapril (ACCUPRIL) 40 MG tablet Take 40 mg by mouth daily.        . sitaGLIPtin (JANUVIA) 50 MG tablet Take 50 mg by mouth daily.      . spironolactone (ALDACTONE) 25 MG tablet Take 25 mg by mouth daily.       No facility-administered encounter medications on file as of 12/16/2012.    Allergies  Allergen Reactions  . Clopidogrel Bisulfate     REACTION: rash    Past Medical History  Diagnosis Date  . Obesity   . Diabetes mellitus   . Sleep apnea   . Hypertension   . CAD (coronary artery disease)     s/p nstemi 05/2009 treated w drug-eluting stents   ROS: Positive for diffuse joint pains, otherwise negative except as per HPI  BP 126/56  Pulse 75  Ht 5' 2" (1.575 m)  Wt 136.079 kg (300 lb)  BMI 54.86 kg/m2  SpO2 95%  PHYSICAL EXAM: Pt is alert and oriented, pleasant, morbidly obese woman in NAD HEENT: normal Neck: JVP - normal, carotids 2+= without bruits Lungs: CTA bilaterally CV: RRR without murmur or gallop Abd: soft, NT, Positive BS, no hepatomegaly Ext: 1+ bilateral pretibial edema, distal pulses intact and equal Skin: warm/dry no rash  EKG:  Normal sinus rhythm with right bundle branch block and left anterior fascicular block  ASSESSMENT AND PLAN: 1. Coronary artery disease, native vessel with new onset CCS class III   anginal symptoms as well as signs of congestive heart failure. The patient has undergone complex bifurcation stenting now about 4 years ago. She is not a good candidate for noninvasive evaluation because of her morbid obesity and inability to walk on a treadmill. Because of high pretest probability and typical anginal symptoms with associated heart failure. I think it is most appropriate to proceed directly with cardiac catheterization and possible PCI. I have reviewed the risks, indications, and alternatives of cardiac catheterization and PCI with the patient. She understands the risks of stroke, myocardial infarction, emergency surgery, vascular injury, bleeding, and death are all less than 1%. She agrees to proceed and  we will schedule her next week.  2. Acute diastolic heart failure. Her symptoms are typical of congestive heart failure. She has had normal left ventricular function in the past. She is on an ACE inhibitor and Aldactone. I'm going to add Lasix 20 mg daily. Will check precath labs. Further assessment with left ventriculography at the time of her heart catheterization.  3. Hypertension. Blood pressure controlled on isosorbide, metoprolol, Aldactone, and quinapril.  4. Hyperlipidemia. The patient is on high-dose atorvastatin. Labs from last year showed cholesterol 157, HDL 48, and LDL 84.  Merrel Crabbe 12/16/2012 11:20 AM      

## 2012-12-16 NOTE — Patient Instructions (Addendum)
Your physician has requested that you have a cardiac catheterization. Cardiac catheterization is used to diagnose and/or treat various heart conditions. Doctors may recommend this procedure for a number of different reasons. The most common reason is to evaluate chest pain. Chest pain can be a symptom of coronary artery disease (CAD), and cardiac catheterization can show whether plaque is narrowing or blocking your heart's arteries. This procedure is also used to evaluate the valves, as well as measure the blood flow and oxygen levels in different parts of your heart. For further information please visit [email or web address] Please follow instruction sheet, as given. Scheduled for December 25, 2012  Your physician has recommended you make the following change in your medication: Start furosemide 20 mg by mouth daily

## 2012-12-23 ENCOUNTER — Other Ambulatory Visit: Payer: Self-pay | Admitting: Cardiovascular Disease

## 2012-12-23 MED ORDER — SODIUM CHLORIDE 0.9 % IV SOLN: INTRAVENOUS | Status: DC

## 2012-12-25 ENCOUNTER — Ambulatory Visit (HOSPITAL_COMMUNITY)
Admission: RE | Admit: 2012-12-25 | Discharge: 2012-12-25 | Disposition: A | Discharge status: Home / Self Care | Payer: Medicaid Other | Source: Ambulatory Visit | Attending: Cardiovascular Disease | Admitting: Cardiovascular Disease

## 2012-12-25 ENCOUNTER — Encounter (HOSPITAL_COMMUNITY)
Admission: RE | Disposition: A | Discharge status: Home / Self Care | Payer: Self-pay | Source: Ambulatory Visit | Attending: Cardiovascular Disease

## 2012-12-25 DIAGNOSIS — G4730 Sleep apnea, unspecified: Secondary | ICD-10-CM | POA: Insufficient documentation

## 2012-12-25 DIAGNOSIS — Z7982 Long term (current) use of aspirin: Secondary | ICD-10-CM | POA: Insufficient documentation

## 2012-12-25 DIAGNOSIS — I252 Old myocardial infarction: Secondary | ICD-10-CM | POA: Insufficient documentation

## 2012-12-25 DIAGNOSIS — R079 Chest pain, unspecified: Secondary | ICD-10-CM | POA: Insufficient documentation

## 2012-12-25 DIAGNOSIS — Z6841 Body Mass Index (BMI) 40.0 and over, adult: Secondary | ICD-10-CM | POA: Insufficient documentation

## 2012-12-25 DIAGNOSIS — I2510 Atherosclerotic heart disease of native coronary artery without angina pectoris: Secondary | ICD-10-CM | POA: Insufficient documentation

## 2012-12-25 DIAGNOSIS — I452 Bifascicular block: Secondary | ICD-10-CM | POA: Insufficient documentation

## 2012-12-25 DIAGNOSIS — I10 Essential (primary) hypertension: Secondary | ICD-10-CM | POA: Insufficient documentation

## 2012-12-25 DIAGNOSIS — Z7902 Long term (current) use of antithrombotics/antiplatelets: Secondary | ICD-10-CM | POA: Insufficient documentation

## 2012-12-25 DIAGNOSIS — E119 Type 2 diabetes mellitus without complications: Secondary | ICD-10-CM | POA: Insufficient documentation

## 2012-12-25 DIAGNOSIS — Z9861 Coronary angioplasty status: Secondary | ICD-10-CM | POA: Insufficient documentation

## 2012-12-25 DIAGNOSIS — Z888 Allergy status to other drugs, medicaments and biological substances status: Secondary | ICD-10-CM | POA: Insufficient documentation

## 2012-12-25 HISTORY — PX: LEFT HEART CATHETERIZATION WITH CORONARY ANGIOGRAM: SHX5451

## 2012-12-25 LAB — PREGNANCY, URINE: Preg Test, Ur: NEGATIVE

## 2012-12-25 LAB — GLUCOSE, CAPILLARY: Glucose-Capillary: 211 mg/dL — ABNORMAL HIGH (ref 70–99)

## 2012-12-25 SURGERY — LEFT HEART CATHETERIZATION WITH CORONARY ANGIOGRAM
Anesthesia: LOCAL

## 2012-12-25 MED ORDER — SODIUM CHLORIDE 0.9 % IV SOLN: 250.0000 mL | INTRAVENOUS | Status: DC | PRN

## 2012-12-25 MED ORDER — MIDAZOLAM HCL 2 MG/2ML IJ SOLN
INTRAMUSCULAR | Status: AC
  Filled 2012-12-25: qty 2

## 2012-12-25 MED ORDER — ACETAMINOPHEN 325 MG PO TABS: 650.0000 mg | ORAL_TABLET | ORAL | Status: DC | PRN

## 2012-12-25 MED ORDER — SODIUM CHLORIDE 0.9 % IJ SOLN: 3.0000 mL | Freq: Two times a day (BID) | INTRAMUSCULAR | Status: DC

## 2012-12-25 MED ORDER — HEPARIN SODIUM (PORCINE) 1000 UNIT/ML IJ SOLN
INTRAMUSCULAR | Status: AC
  Filled 2012-12-25: qty 1

## 2012-12-25 MED ORDER — VERAPAMIL HCL 2.5 MG/ML IV SOLN
INTRAVENOUS | Status: AC
  Filled 2012-12-25: qty 2

## 2012-12-25 MED ORDER — HEPARIN (PORCINE) IN NACL 2-0.9 UNIT/ML-% IJ SOLN
INTRAMUSCULAR | Status: AC
  Filled 2012-12-25: qty 1000

## 2012-12-25 MED ORDER — SODIUM CHLORIDE 0.9 % IV SOLN: 1.0000 mL/kg/h | INTRAVENOUS | Status: DC

## 2012-12-25 MED ORDER — ONDANSETRON HCL 4 MG/2ML IJ SOLN
INTRAMUSCULAR | Status: AC
  Filled 2012-12-25: qty 2

## 2012-12-25 MED ORDER — ASPIRIN 81 MG PO CHEW
324.0000 mg | CHEWABLE_TABLET | ORAL | Status: AC
  Administered 2012-12-25: 324 mg via ORAL
  Filled 2012-12-25: qty 4

## 2012-12-25 MED ORDER — SODIUM CHLORIDE 0.9 % IJ SOLN: 3.0000 mL | INTRAMUSCULAR | Status: DC | PRN

## 2012-12-25 MED ORDER — DIAZEPAM 5 MG PO TABS
5.0000 mg | ORAL_TABLET | ORAL | Status: AC
  Administered 2012-12-25: 5 mg via ORAL
  Filled 2012-12-25: qty 1

## 2012-12-25 MED ORDER — ONDANSETRON HCL 4 MG/2ML IJ SOLN
4.0000 mg | Freq: Four times a day (QID) | INTRAMUSCULAR | Status: DC | PRN
  Administered 2012-12-25: 4 mg via INTRAVENOUS

## 2012-12-25 MED ORDER — SODIUM CHLORIDE 0.9 % IV SOLN
INTRAVENOUS | Status: DC
  Administered 2012-12-25: 1000 mL via INTRAVENOUS

## 2012-12-25 MED ORDER — LIDOCAINE HCL (PF) 1 % IJ SOLN
INTRAMUSCULAR | Status: AC
  Filled 2012-12-25: qty 30

## 2012-12-25 MED ORDER — FENTANYL CITRATE 0.05 MG/ML IJ SOLN
INTRAMUSCULAR | Status: AC
  Filled 2012-12-25: qty 2

## 2012-12-25 NOTE — H&P (View-Only) (Signed)
HPI:  52 year old woman presenting for followup evaluation. The patient has coronary artery disease and she presented with non-ST elevation MI in 2010 and underwent complex bifurcation stenting of the LAD and first diagonal. She's been maintained on long-term dual antiplatelet therapy with aspirin and effient. She has an allergy to Plavix. She has developed progressive shortness of breath over the last month. She complains of associated chest tightness. She is short of breath with low-level activity such as walking less than 25-50 feet. She has taken nitroglycerin with some relief. She denies resting chest pain or pressure. She's had more swelling and weight gain. She complains of chronic orthopnea and now has some episodes of PND. She denies lightheadedness or syncope. She's had no recent bleeding problems. She's been compliant with her medications.  Outpatient Encounter Prescriptions as of 12/16/2012  Medication Sig Dispense Refill  . aspirin (ASPIR-81) 81 MG EC tablet Take 81 mg by mouth daily.        Marland Kitchen atorvastatin (LIPITOR) 80 MG tablet Take 1 tablet (80 mg total) by mouth daily.  90 tablet  3  . celecoxib (CELEBREX) 200 MG capsule Take 200 mg by mouth daily.      . insulin detemir (LEVEMIR FLEXPEN) 100 UNIT/ML injection Inject into the skin. 60 units twice daily       . Insulin Lispro Prot & Lispro (HUMALOG MIX 50/50 Echo) Inject into the skin. 30 units into skin o skin 30 units before meals      . isosorbide mononitrate (IMDUR) 30 MG 24 hr tablet Take 1 tablet (30 mg total) by mouth daily.  90 tablet  3  . metoprolol tartrate (LOPRESSOR) 25 MG tablet Take 1 tablet (25 mg total) by mouth 2 (two) times daily.  180 tablet  3  . nitroGLYCERIN (NITROSTAT) 0.4 MG SL tablet Place 1 tablet (0.4 mg total) under the tongue as needed.  25 tablet  5  . prasugrel (EFFIENT) 10 MG TABS Take 1 tablet (10 mg total) by mouth daily.  90 tablet  3  . quinapril (ACCUPRIL) 40 MG tablet Take 40 mg by mouth daily.        . sitaGLIPtin (JANUVIA) 50 MG tablet Take 50 mg by mouth daily.      Marland Kitchen spironolactone (ALDACTONE) 25 MG tablet Take 25 mg by mouth daily.       No facility-administered encounter medications on file as of 12/16/2012.    Allergies  Allergen Reactions  . Clopidogrel Bisulfate     REACTION: rash    Past Medical History  Diagnosis Date  . Obesity   . Diabetes mellitus   . Sleep apnea   . Hypertension   . CAD (coronary artery disease)     s/p nstemi 05/2009 treated w drug-eluting stents   ROS: Positive for diffuse joint pains, otherwise negative except as per HPI  BP 126/56  Pulse 75  Ht 5\' 2"  (1.575 m)  Wt 136.079 kg (300 lb)  BMI 54.86 kg/m2  SpO2 95%  PHYSICAL EXAM: Pt is alert and oriented, pleasant, morbidly obese woman in NAD HEENT: normal Neck: JVP - normal, carotids 2+= without bruits Lungs: CTA bilaterally CV: RRR without murmur or gallop Abd: soft, NT, Positive BS, no hepatomegaly Ext: 1+ bilateral pretibial edema, distal pulses intact and equal Skin: warm/dry no rash  EKG:  Normal sinus rhythm with right bundle branch block and left anterior fascicular block  ASSESSMENT AND PLAN: 1. Coronary artery disease, native vessel with new onset CCS class III  anginal symptoms as well as signs of congestive heart failure. The patient has undergone complex bifurcation stenting now about 4 years ago. She is not a good candidate for noninvasive evaluation because of her morbid obesity and inability to walk on a treadmill. Because of high pretest probability and typical anginal symptoms with associated heart failure. I think it is most appropriate to proceed directly with cardiac catheterization and possible PCI. I have reviewed the risks, indications, and alternatives of cardiac catheterization and PCI with the patient. She understands the risks of stroke, myocardial infarction, emergency surgery, vascular injury, bleeding, and death are all less than 1%. She agrees to proceed and  we will schedule her next week.  2. Acute diastolic heart failure. Her symptoms are typical of congestive heart failure. She has had normal left ventricular function in the past. She is on an ACE inhibitor and Aldactone. I'm going to add Lasix 20 mg daily. Will check precath labs. Further assessment with left ventriculography at the time of her heart catheterization.  3. Hypertension. Blood pressure controlled on isosorbide, metoprolol, Aldactone, and quinapril.  4. Hyperlipidemia. The patient is on high-dose atorvastatin. Labs from last year showed cholesterol 157, HDL 48, and LDL 84.  Tonny Bollman 12/16/2012 11:20 AM

## 2012-12-25 NOTE — Interval H&P Note (Signed)
History and Physical Interval Note:  12/25/2012 10:13 AM  Brenda [name]  has presented today for surgery, with the diagnosis of cp  The various methods of treatment have been discussed with the patient and family. After consideration of risks, benefits and other options for treatment, the patient has consented to  Procedure(s): LEFT HEART CATHETERIZATION WITH CORONARY ANGIOGRAM (N/A) as a surgical intervention .  The patient's history has been reviewed, patient examined, no change in status, stable for surgery.  I have reviewed the patient's chart and labs.  Questions were answered to the patient's satisfaction.     Tonny Bollman

## 2012-12-25 NOTE — CV Procedure (Signed)
   Cardiac Catheterization Procedure Note  Name: Brenda [name] MRN: [id number] DOB: 22-Dec-1960  Procedure: Left Heart Cath, Selective Coronary Angiography, LV angiography  Indication: Progressive CCS class III anginal symptoms. This is a 52 year old very nice, morbidly obese woman well-known to me. He underwent complex bifurcation stenting of the LAD/diagonal branches and 2010. She's done well until the last few months which is developed progressive symptoms of shortness of breath and chest discomfort with exertion. Her body habitus is not amenable to noninvasive testing and with high pretest probability I brought her directly for cardiac catheterization. The left radial artery was my planned approach because she has a known radial loop on the right side.   Procedural Details: The right wrist was prepped, draped, and anesthetized with 1% lidocaine. Using the modified Seldinger technique, a 6 French sheath was introduced into the right radial artery. 3 mg of verapamil was administered through the sheath, weight-based unfractionated heparin was administered intravenously. Standard Judkins catheters were used for selective coronary angiography and left ventriculography. Catheter exchanges were performed over an exchange length guidewire. There were no immediate procedural complications. A TR band was used for radial hemostasis at the completion of the procedure.  The patient was transferred to the post catheterization recovery area for further monitoring.  Procedural Findings: Hemodynamics: AO 111/67 with a mean of 87 LV 115/13  Coronary angiography: Coronary dominance: right  Left mainstem: The left mainstem is patent. There is minor regularity but no significant stenosis present.  Left anterior descending (LAD): The LAD is patent to the left ventricular apex. The previously placed LAD stent is patent throughout. There is very mild in-stent restenosis present. Just beyond the stented segment in  the mid LAD there is 50% stenosis present. The remaining portions of the mid and distal LAD are patent. The first diagonal branch is large in caliber. The stented segment is patent. The ostium of the diagonal branch has 80% stenosis focally.  Left circumflex (LCx): The left circumflex is small. The vessel gives off a single obtuse marginal branch. There is no significant stenosis present.  Right coronary artery (RCA): The right coronary artery is huge. The vessel is dominant. It is smooth throughout its course. There are very minor irregularities present. The vessel gives off a PDA and posterior lateral branch with no significant stenoses.  Left ventriculography: Left ventricular systolic function is normal, LVEF is estimated at 65%, there is no significant mitral regurgitation   Final Conclusions:   1. Continued patency of the complex LAD/diagonal bifurcation with moderate mid LAD stenosis and severe stenosis at the diagonal ostium 2. No significant obstructive disease the left circumflex or the right coronary arteries 3. Normal left ventricular systolic function  Recommendations: The patient's main area of potential ischemia is in the diagonal territory. Because of her body habitus, any procedure is technically very difficult. There is not a large area of myocardium at risk and I feel the most prudent approach his initial medical therapy with consideration of PCI for refractory anginal symptoms.  Tonny Bollman 12/25/2012, 10:52 AM

## 2013-03-11 ENCOUNTER — Ambulatory Visit (INDEPENDENT_AMBULATORY_CARE_PROVIDER_SITE_OTHER): Payer: Medicaid Other | Admitting: Internal Medicine

## 2013-03-11 ENCOUNTER — Encounter: Payer: Self-pay | Admitting: Internal Medicine

## 2013-03-11 VITALS — BP 100/70 | HR 72 | Temp 98.4°F | Ht 62.5 in | Wt 299.8 lb

## 2013-03-11 DIAGNOSIS — R0609 Other forms of dyspnea: Secondary | ICD-10-CM

## 2013-03-11 DIAGNOSIS — R0600 Dyspnea, unspecified: Secondary | ICD-10-CM

## 2013-03-11 NOTE — Assessment & Plan Note (Signed)
Dyspnea most likely due to obesity, deconditioning, diastolic dysfunction +/- angina equivalent (Stable angina). Doubt intrinsic lung disease. Will get PFTs. IF normal, will do pulmonary stress test. At some point will meet with her formally about nutritional approach to weight loss. She is in agreement with plan

## 2013-03-11 NOTE — Patient Instructions (Addendum)
Do breathing test called PFT Depending on these results might order pulmonary bike stress test WE will stay in touch over telephone

## 2013-03-11 NOTE — Progress Notes (Signed)
Subjective:    Patient ID: Brenda [name], female    DOB: [birth year], 52 y.o.   MRN: [id number] PCP Jearld Lesch, MD Cards - Dr Tonny Bollman  HPI IOV 03/11/2013   52 year old mporbidly obese female. Says allways obese. But since Sept 2013 having worsenign dyspnea compared to baseline. Insidious worsening. BEfore Sept 2013 could do all ADLS with some dyspnea but since then unable to go to church due to dyspnea walking from parking lot to building, changing clothes but no dyspnea for eating. Dyspnea releived by rest. Some days worse than others. She did have flare upin Jan 2014 but this was due to bronchisit. She thinks it might be progressive since Jan 2014. Rates dyspnea as mild when sitting but severe with exertion.   Dyspnea is affecting her quality of life because she wants to exercise and lose weight and be able to do ADLs  There is associated early AM dry cough and a mild cough all day long. Cough is rated as moderate. Cough present since Sept 2013. Associted ace inhiiubitor intake for 15 years +. Denies associated sinus driainge or acid reflux  There is associated chest pains with exertion and sometimes with cough. Says Dr Excell Seltzer did cath in feb 2014 but did not show significant lesions per hex   Dyspnea relevant hx Estimated body mass index is 53.93 kg/(m^2) as calculated from the following:   Height as of this encounter: 5' 2.5" (1.588 m).   Weight as of this encounter: 299 lb 12.8 oz (135.988 kg). - says this is her stable weight for many years.  -  reports that she quit smoking about 26 years ago. Her smoking use included Cigarettes and Cigars. She has a .5 pack-year smoking history. She has never used smokeless tobacco.  - CAD and s/p stent. Apparently cath April [id number] did not sho=w new lesions  - Final Conclusions:  1. Continued patency of the complex LAD/diagonal bifurcation with moderate mid LAD stenosis and severe stenosis at the diagonal ostium  2. No significant  obstructive disease the left circumflex or the right coronary arteries  3. Normal left ventricular systolic function  Recommendations: The patient's main area of potential ischemia is in the diagonal territory. Because of her body habitus, any procedure is technically very difficult. There is not a large area of myocardium at risk and I feel the most prudent approach his initial medical therapy with consideration of PCI for refractory anginal symptoms.      - OSA - followed by Jearld Lesch, MD her pcp. Not compliant with cpap.   - ImagingSept 2010  - Noral CT chest  - Imaging May 2012 CXR  - Normal  Walking desaturation test: Peak HR 96. Did not desaturate below 95% but stopped once due to dyspnea     Past Medical History  Diagnosis Date  . Obesity   . Diabetes mellitus   . Sleep apnea     CPAP  . Hypertension   . CAD (coronary artery disease)     s/p nstemi 05/2009 treated w drug-eluting stents  . Heart attack   . Arthritis      Family History  Problem Relation Age of Onset  . Coronary artery disease Mother     in her mid 31s  . Heart attack Father     in his mid 52s  . COPD Father   . Hypertension Mother   . Cancer Mother   . Diabetes Sister   . Coronary artery disease Sister  valve replacement  . Stroke Sister     multiple     History   Social History  . Marital Status: Married    Spouse Name: N/A    Number of Children: 2  . Years of Education: N/A   Occupational History  . Housewife    Social History Main Topics  . Smoking status: Former Smoker -- 0.50 packs/day for 1 years    Types: Cigarettes, Cigars    Quit date: 09/03/1986  . Smokeless tobacco: Never Used     Comment: 1 YEAR IN COLLEGE  . Alcohol Use: No  . Drug Use: No  . Sexually Active: Not on file   Other Topics Concern  . Not on file   Social History Narrative  . No narrative on file     Allergies  Allergen Reactions  . Clopidogrel Bisulfate     REACTION: rash      Outpatient Prescriptions Prior to Visit  Medication Sig Dispense Refill  . aspirin (ASPIR-81) 81 MG EC tablet Take 81 mg by mouth daily.        Marland Kitchen atorvastatin (LIPITOR) 80 MG tablet Take 1 tablet (80 mg total) by mouth daily.  90 tablet  3  . furosemide (LASIX) 20 MG tablet Take 1 tablet (20 mg total) by mouth daily.  30 tablet  6  . insulin detemir (LEVEMIR FLEXPEN) 100 UNIT/ML injection Inject 60 Units into the skin 2 (two) times daily.       . insulin lispro (HUMALOG) 100 UNIT/ML injection Inject 30 Units into the skin 3 (three) times daily before meals.      . isosorbide mononitrate (IMDUR) 30 MG 24 hr tablet Take 1 tablet (30 mg total) by mouth daily.  90 tablet  3  . metoprolol tartrate (LOPRESSOR) 25 MG tablet Take 1 tablet (25 mg total) by mouth 2 (two) times daily.  180 tablet  3  . nitroGLYCERIN (NITROSTAT) 0.4 MG SL tablet Place 0.4 mg under the tongue every 5 (five) minutes as needed for chest pain.      . prasugrel (EFFIENT) 10 MG TABS Take 1 tablet (10 mg total) by mouth daily.  90 tablet  3  . quinapril (ACCUPRIL) 40 MG tablet Take 40 mg by mouth daily.       . sitaGLIPtin (JANUVIA) 50 MG tablet Take 50 mg by mouth daily.      Marland Kitchen spironolactone (ALDACTONE) 25 MG tablet Take 25 mg by mouth daily.      . celecoxib (CELEBREX) 200 MG capsule Take 200 mg by mouth daily.       No facility-administered medications prior to visit.      Review of Systems  Constitutional: Negative for fever and unexpected weight change.  HENT: Negative for ear pain, nosebleeds, congestion, sore throat, rhinorrhea, sneezing, trouble swallowing, dental problem, postnasal drip and sinus pressure.   Eyes: Negative for redness and itching.  Respiratory: Positive for cough and shortness of breath. Negative for chest tightness and wheezing.   Cardiovascular: Negative for palpitations and leg swelling.  Gastrointestinal: Negative for nausea and vomiting.  Genitourinary: Negative for dysuria.   Musculoskeletal: Negative for joint swelling.  Skin: Negative for rash.  Neurological: Negative for headaches.  Hematological: Does not bruise/bleed easily.  Psychiatric/Behavioral: Negative for dysphoric mood. The patient is not nervous/anxious.        Objective:   Physical Exam  Vitals reviewed. Constitutional: She is oriented to person, place, and time. She appears well-developed and well-nourished. No distress.  Body mass index is 53.93 kg/(m^2).   HENT:  Head: Normocephalic and atraumatic.  Right Ear: External ear normal.  Left Ear: External ear normal.  Mouth/Throat: Oropharynx is clear and moist. No oropharyngeal exudate.  hirsute  Eyes: Conjunctivae and EOM are normal. Pupils are equal, round, and reactive to light. Right eye exhibits no discharge. Left eye exhibits no discharge. No scleral icterus.  Neck: Normal range of motion. Neck supple. No JVD present. No tracheal deviation present. No thyromegaly present.  Cardiovascular: Normal rate, regular rhythm, normal heart sounds and intact distal pulses.  Exam reveals no gallop and no friction rub.   No murmur heard. Pulmonary/Chest: Effort normal and breath sounds normal. No respiratory distress. She has no wheezes. She has no rales. She exhibits no tenderness.  Abdominal: Soft. Bowel sounds are normal. She exhibits no distension and no mass. There is no tenderness. There is no rebound and no guarding.  Musculoskeletal: Normal range of motion. She exhibits no edema and no tenderness.  Lymphadenopathy:    She has no cervical adenopathy.  Neurological: She is alert and oriented to person, place, and time. She has normal reflexes. No cranial nerve deficit. She exhibits normal muscle tone. Coordination normal.  Skin: Skin is warm and dry. No rash noted. She is not diaphoretic. No erythema. No pallor.  Psychiatric: She has a normal mood and affect. Her behavior is normal. Judgment and thought content normal.           Assessment & Plan:

## 2013-03-17 ENCOUNTER — Encounter (HOSPITAL_COMMUNITY): Payer: Medicaid Other

## 2013-10-23 ENCOUNTER — Ambulatory Visit (INDEPENDENT_AMBULATORY_CARE_PROVIDER_SITE_OTHER): Payer: Medicaid Other | Admitting: Cardiovascular Disease

## 2013-10-23 ENCOUNTER — Encounter: Payer: Self-pay | Admitting: Cardiovascular Disease

## 2013-10-23 VITALS — BP 120/58 | HR 78 | Ht 62.5 in | Wt 300.0 lb

## 2013-10-23 DIAGNOSIS — R079 Chest pain, unspecified: Secondary | ICD-10-CM

## 2013-10-23 DIAGNOSIS — E785 Hyperlipidemia, unspecified: Secondary | ICD-10-CM

## 2013-10-23 DIAGNOSIS — I2510 Atherosclerotic heart disease of native coronary artery without angina pectoris: Secondary | ICD-10-CM

## 2013-10-23 MED ORDER — NITROGLYCERIN 0.4 MG SL SUBL
0.4000 mg | SUBLINGUAL_TABLET | SUBLINGUAL | Status: DC | PRN
Start: 2013-10-23 — End: 2015-02-24

## 2013-10-23 MED ORDER — ATORVASTATIN CALCIUM 80 MG PO TABS: 80.0000 mg | ORAL_TABLET | Freq: Every day | ORAL | Status: DC

## 2013-10-23 MED ORDER — FUROSEMIDE 20 MG PO TABS: 20.0000 mg | ORAL_TABLET | Freq: Every day | ORAL | Status: DC

## 2013-10-23 MED ORDER — METOPROLOL TARTRATE 25 MG PO TABS: 25.0000 mg | ORAL_TABLET | Freq: Two times a day (BID) | ORAL | Status: DC

## 2013-10-23 MED ORDER — PRASUGREL HCL 10 MG PO TABS: 10.0000 mg | ORAL_TABLET | Freq: Every day | ORAL | Status: DC

## 2013-10-23 MED ORDER — ISOSORBIDE MONONITRATE ER 30 MG PO TB24: 30.0000 mg | ORAL_TABLET | Freq: Every day | ORAL | Status: DC

## 2013-10-23 NOTE — Progress Notes (Signed)
HPI:  53 year old Brenda [name] presenting for followup evaluation. The patient is seen for coronary artery disease. She initially presented in 2000 and with a non-ST elevation infarction and underwent complex bifurcation stenting of the LAD and first diagonal. She's been maintained on long-term dual antiplatelet therapy with aspirin and effient. She was allergic to Plavix. When I saw her last in April 2014 she complained of exertional chest discomfort and dyspnea. She underwent repeat heart catheterization demonstrating severe in-stent restenosis in the diagonal branch and moderate in-stent restenosis of the LAD. Because of challenges around procedural technique with her body habitus and complexity of her coronary anatomy, medical therapy was recommended as an initial approach. She returns today for followup evaluation.  She's actually doing very well. She denies chest pain or pressure with exertion. She feels like the medications are controlling her symptoms. She continues to have dyspnea with exertion and this is unchanged over time. She denies edema, palpitations, orthopnea, or PND.  Outpatient Encounter Prescriptions as of 10/23/2013  Medication Sig  . aspirin (ASPIR-81) 81 MG EC tablet Take 81 mg by mouth daily.    Marland Kitchen atorvastatin (LIPITOR) 80 MG tablet Take 1 tablet (80 mg total) by mouth daily.  . furosemide (LASIX) 20 MG tablet Take 1 tablet (20 mg total) by mouth daily.  . insulin detemir (LEVEMIR FLEXPEN) 100 UNIT/ML injection Inject 60 Units into the skin 2 (two) times daily.   . insulin lispro (HUMALOG) 100 UNIT/ML injection Inject 30 Units into the skin 3 (three) times daily before meals.  . isosorbide mononitrate (IMDUR) 30 MG 24 hr tablet Take 1 tablet (30 mg total) by mouth daily.  . metFORMIN (GLUCOPHAGE) 500 MG tablet Take 500 mg by mouth 2 (two) times daily with a meal. 2 tablets BID  . metoprolol tartrate (LOPRESSOR) 25 MG tablet Take 1 tablet (25 mg total) by mouth 2 (two) times daily.   . nitroGLYCERIN (NITROSTAT) 0.4 MG SL tablet Place 0.4 mg under the tongue every 5 (five) minutes as needed for chest pain.  . prasugrel (EFFIENT) 10 MG TABS Take 1 tablet (10 mg total) by mouth daily.  . quinapril (ACCUPRIL) 40 MG tablet Take 40 mg by mouth daily.   . sitaGLIPtin (JANUVIA) 50 MG tablet Take 50 mg by mouth daily.  Marland Kitchen spironolactone (ALDACTONE) 25 MG tablet Take 25 mg by mouth daily.    Allergies  Allergen Reactions  . Clopidogrel Bisulfate     REACTION: rash    Past Medical History  Diagnosis Date  . Obesity   . Diabetes mellitus   . Sleep apnea     CPAP  . Hypertension   . CAD (coronary artery disease)     s/p nstemi 05/2009 treated w drug-eluting stents  . Heart attack   . Arthritis     ROS: Negative except as per HPI  BP 120/58  Pulse 78  Ht 5' 2.5" (1.588 m)  Wt 300 lb (136.079 kg)  BMI 53.96 kg/m2  PHYSICAL EXAM: Pt is alert and oriented, morbidly obese Brenda [name] in NAD HEENT: normal Neck: JVP - normal, carotids 2+= without bruits Lungs: CTA bilaterally CV: RRR without murmur or gallop Abd: soft, NT, Positive BS, no hepatomegaly Ext: Trace pretibial edema, distal pulses intact and equal Skin: warm/dry no rash  EKG:  Sinus rhythm 78 beats per minute, right bundle branch block, left anterior fascicular block. No significant change from previous tracing.  Cardiac catheterization 12/25/2012: Procedure: Left Heart Cath, Selective Coronary Angiography, LV angiography  Indication:  Progressive CCS class III anginal symptoms. This is a 53 year old very nice, morbidly obese Brenda [name] well-known to me. He underwent complex bifurcation stenting of the LAD/diagonal branches and 2010. She's done well until the last few months which is developed progressive symptoms of shortness of breath and chest discomfort with exertion. Her body habitus is not amenable to noninvasive testing and with high pretest probability I brought her directly for cardiac catheterization. The  left radial artery was my planned approach because she has a known radial loop on the right side.  Procedural Details: The right wrist was prepped, draped, and anesthetized with 1% lidocaine. Using the modified Seldinger technique, a 6 French sheath was introduced into the right radial artery. 3 mg of verapamil was administered through the sheath, weight-based unfractionated heparin was administered intravenously. Standard Judkins catheters were used for selective coronary angiography and left ventriculography. Catheter exchanges were performed over an exchange length guidewire. There were no immediate procedural complications. A TR band was used for radial hemostasis at the completion of the procedure. The patient was transferred to the post catheterization recovery area for further monitoring.  Procedural Findings:  Hemodynamics:  AO 111/67 with a mean of 87  LV 115/13  Coronary angiography:  Coronary dominance: right  Left mainstem: The left mainstem is patent. There is minor regularity but no significant stenosis present.  Left anterior descending (LAD): The LAD is patent to the left ventricular apex. The previously placed LAD stent is patent throughout. There is very mild in-stent restenosis present. Just beyond the stented segment in the mid LAD there is 50% stenosis present. The remaining portions of the mid and distal LAD are patent. The first diagonal branch is large in caliber. The stented segment is patent. The ostium of the diagonal branch has 80% stenosis focally.  Left circumflex (LCx): The left circumflex is small. The vessel gives off a single obtuse marginal branch. There is no significant stenosis present.  Right coronary artery (RCA): The right coronary artery is huge. The vessel is dominant. It is smooth throughout its course. There are very minor irregularities present. The vessel gives off a PDA and posterior lateral branch with no significant stenoses.  Left ventriculography: Left  ventricular systolic function is normal, LVEF is estimated at 65%, there is no significant mitral regurgitation  Final Conclusions:  1. Continued patency of the complex LAD/diagonal bifurcation with moderate mid LAD stenosis and severe stenosis at the diagonal ostium  2. No significant obstructive disease the left circumflex or the right coronary arteries  3. Normal left ventricular systolic function  Recommendations: The patient's main area of potential ischemia is in the diagonal territory. Because of her body habitus, any procedure is technically very difficult. There is not a large area of myocardium at risk and I feel the most prudent approach his initial medical therapy with consideration of PCI for refractory anginal symptoms.   ASSESSMENT AND PLAN: 1. Coronary artery disease, native vessel. She has undergone complex bifurcation of stenting of the LAD/diagonal with restenosis primarily in the diagonal branch. She will continue on dual antiplatelet therapy as well as her antianginal program without changes. Her symptoms are well controlled. She understands that if she were to develop progressive symptoms of angina probably she would be a candidate for repeat PCI. She will call if symptoms recur. Otherwise I will see her back in one year.  2. Hypertension. Blood pressure is controlled on a combination of furosemide, isosorbide, metoprolol, quinapril, and spironolactone. Lab work is followed by Dr.  Williams.  3. Hyperlipidemia. The patient is on a high intensity statin drug with Lipitor 80 mg. Labs followed by Dr. Jimmye Norman.  For followup I will see her back in one year.  Sherren Mocha 10/23/2013 9:33 AM

## 2013-10-23 NOTE — Patient Instructions (Signed)
Your physician wants you to follow-up in: 1 year with Dr. Burt Knack.  You will receive a reminder letter in the mail two months in advance. If you don't receive a letter, please call our office to schedule the follow-up appointment.  Your physician recommends that you continue on your current medications as directed. Please refer to the Current Medication list given to you today.  Please call [employer] at [phone] regarding your new insurance.

## 2014-03-04 ENCOUNTER — Other Ambulatory Visit: Payer: Self-pay | Admitting: Cardiovascular Disease

## 2014-03-04 ENCOUNTER — Other Ambulatory Visit: Payer: Self-pay

## 2014-03-04 MED ORDER — PRASUGREL HCL 10 MG PO TABS: 10.0000 mg | ORAL_TABLET | Freq: Every day | ORAL | Status: DC

## 2014-08-12 ENCOUNTER — Encounter (HOSPITAL_COMMUNITY): Payer: Self-pay | Admitting: Cardiovascular Disease

## 2014-10-14 ENCOUNTER — Encounter: Payer: Self-pay | Admitting: Cardiovascular Disease

## 2015-02-22 ENCOUNTER — Inpatient Hospital Stay (HOSPITAL_COMMUNITY)
Admission: EM | Admit: 2015-02-22 | Discharge: 2015-02-24 | DRG: 313 | Disposition: A | Discharge status: Home / Self Care | Payer: Self-pay | Attending: Internal Medicine | Admitting: Internal Medicine

## 2015-02-22 ENCOUNTER — Emergency Department (HOSPITAL_COMMUNITY): Payer: Medicaid Other

## 2015-02-22 ENCOUNTER — Encounter (HOSPITAL_COMMUNITY): Payer: Self-pay | Admitting: *Deleted

## 2015-02-22 DIAGNOSIS — D72829 Elevated white blood cell count, unspecified: Secondary | ICD-10-CM | POA: Diagnosis present

## 2015-02-22 DIAGNOSIS — R81 Glycosuria: Secondary | ICD-10-CM | POA: Diagnosis present

## 2015-02-22 DIAGNOSIS — E66811 Obesity, class 1: Secondary | ICD-10-CM | POA: Diagnosis present

## 2015-02-22 DIAGNOSIS — G4730 Sleep apnea, unspecified: Secondary | ICD-10-CM | POA: Diagnosis present

## 2015-02-22 DIAGNOSIS — E118 Type 2 diabetes mellitus with unspecified complications: Secondary | ICD-10-CM | POA: Diagnosis present

## 2015-02-22 DIAGNOSIS — Z885 Allergy status to narcotic agent status: Secondary | ICD-10-CM

## 2015-02-22 DIAGNOSIS — Z6841 Body Mass Index (BMI) 40.0 and over, adult: Secondary | ICD-10-CM

## 2015-02-22 DIAGNOSIS — R1110 Vomiting, unspecified: Secondary | ICD-10-CM | POA: Diagnosis present

## 2015-02-22 DIAGNOSIS — I959 Hypotension, unspecified: Secondary | ICD-10-CM | POA: Diagnosis present

## 2015-02-22 DIAGNOSIS — Z7982 Long term (current) use of aspirin: Secondary | ICD-10-CM

## 2015-02-22 DIAGNOSIS — R0789 Other chest pain: Principal | ICD-10-CM | POA: Diagnosis present

## 2015-02-22 DIAGNOSIS — Z794 Long term (current) use of insulin: Secondary | ICD-10-CM | POA: Diagnosis present

## 2015-02-22 DIAGNOSIS — E1165 Type 2 diabetes mellitus with hyperglycemia: Secondary | ICD-10-CM | POA: Diagnosis present

## 2015-02-22 DIAGNOSIS — N390 Urinary tract infection, site not specified: Secondary | ICD-10-CM | POA: Diagnosis present

## 2015-02-22 DIAGNOSIS — I2510 Atherosclerotic heart disease of native coronary artery without angina pectoris: Secondary | ICD-10-CM | POA: Diagnosis present

## 2015-02-22 DIAGNOSIS — A084 Viral intestinal infection, unspecified: Secondary | ICD-10-CM | POA: Diagnosis present

## 2015-02-22 DIAGNOSIS — Z888 Allergy status to other drugs, medicaments and biological substances status: Secondary | ICD-10-CM

## 2015-02-22 DIAGNOSIS — Z87891 Personal history of nicotine dependence: Secondary | ICD-10-CM

## 2015-02-22 DIAGNOSIS — R079 Chest pain, unspecified: Secondary | ICD-10-CM | POA: Diagnosis present

## 2015-02-22 DIAGNOSIS — E860 Dehydration: Secondary | ICD-10-CM | POA: Diagnosis present

## 2015-02-22 DIAGNOSIS — Z8249 Family history of ischemic heart disease and other diseases of the circulatory system: Secondary | ICD-10-CM

## 2015-02-22 DIAGNOSIS — E1121 Type 2 diabetes mellitus with diabetic nephropathy: Secondary | ICD-10-CM | POA: Diagnosis present

## 2015-02-22 DIAGNOSIS — D751 Secondary polycythemia: Secondary | ICD-10-CM | POA: Diagnosis present

## 2015-02-22 DIAGNOSIS — E1129 Type 2 diabetes mellitus with other diabetic kidney complication: Secondary | ICD-10-CM | POA: Diagnosis present

## 2015-02-22 DIAGNOSIS — Z79899 Other long term (current) drug therapy: Secondary | ICD-10-CM

## 2015-02-22 DIAGNOSIS — G4733 Obstructive sleep apnea (adult) (pediatric): Secondary | ICD-10-CM | POA: Diagnosis present

## 2015-02-22 DIAGNOSIS — I252 Old myocardial infarction: Secondary | ICD-10-CM

## 2015-02-22 DIAGNOSIS — N179 Acute kidney failure, unspecified: Secondary | ICD-10-CM | POA: Diagnosis present

## 2015-02-22 DIAGNOSIS — M546 Pain in thoracic spine: Secondary | ICD-10-CM

## 2015-02-22 DIAGNOSIS — R739 Hyperglycemia, unspecified: Secondary | ICD-10-CM | POA: Diagnosis present

## 2015-02-22 HISTORY — DX: Depression, unspecified: F32.A

## 2015-02-22 HISTORY — DX: Pure hypercholesterolemia, unspecified: E78.00

## 2015-02-22 HISTORY — DX: Heart failure, unspecified: I50.9

## 2015-02-22 HISTORY — DX: Type 2 diabetes mellitus without complications: E11.9

## 2015-02-22 HISTORY — DX: Major depressive disorder, single episode, unspecified: F32.9

## 2015-02-22 LAB — URINALYSIS, ROUTINE W REFLEX MICROSCOPIC
Glucose, UA: 100 mg/dL — AB
Hgb urine dipstick: NEGATIVE
KETONES UR: 40 mg/dL — AB
NITRITE: POSITIVE — AB
Protein, ur: 100 mg/dL — AB
Specific Gravity, Urine: 1.02 (ref 1.005–1.030)
Urobilinogen, UA: 2 mg/dL — ABNORMAL HIGH (ref 0.0–1.0)
pH: 5 (ref 5.0–8.0)

## 2015-02-22 LAB — URINE MICROSCOPIC-ADD ON

## 2015-02-22 LAB — CBC
HCT: 46 % (ref 36.0–46.0)
Hemoglobin: 16.1 g/dL — ABNORMAL HIGH (ref 12.0–15.0)
MCH: 29.7 pg (ref 26.0–34.0)
MCHC: 35 g/dL (ref 30.0–36.0)
MCV: 84.7 fL (ref 78.0–100.0)
Platelets: 280 10*3/uL (ref 150–400)
RBC: 5.43 MIL/uL — ABNORMAL HIGH (ref 3.87–5.11)
RDW: 12.2 % (ref 11.5–15.5)
WBC: 17.3 10*3/uL — AB (ref 4.0–10.5)

## 2015-02-22 LAB — CREATININE, SERUM
Creatinine, Ser: 1.93 mg/dL — ABNORMAL HIGH (ref 0.44–1.00)
GFR calc Af Amer: 34 mL/min — ABNORMAL LOW (ref >60)
GFR calc non Af Amer: 29 mL/min — ABNORMAL LOW (ref >60)

## 2015-02-22 LAB — TROPONIN I

## 2015-02-22 LAB — BRAIN NATRIURETIC PEPTIDE: B Natriuretic Peptide: 32.7 pg/mL (ref 0.0–100.0)

## 2015-02-22 LAB — GLUCOSE, CAPILLARY
GLUCOSE-CAPILLARY: 146 mg/dL — AB (ref 65–99)
Glucose-Capillary: 234 mg/dL — ABNORMAL HIGH (ref 65–99)

## 2015-02-22 LAB — BASIC METABOLIC PANEL
Anion gap: 16 — ABNORMAL HIGH (ref 5–15)
BUN: 26 mg/dL — AB (ref 6–20)
CALCIUM: 9.3 mg/dL (ref 8.9–10.3)
CO2: 22 mmol/L (ref 22–32)
Chloride: 94 mmol/L — ABNORMAL LOW (ref 101–111)
Creatinine, Ser: 1.57 mg/dL — ABNORMAL HIGH (ref 0.44–1.00)
GFR calc Af Amer: 43 mL/min — ABNORMAL LOW (ref >60)
GFR calc non Af Amer: 37 mL/min — ABNORMAL LOW (ref >60)
GLUCOSE: 400 mg/dL — AB (ref 65–99)
Potassium: 4.7 mmol/L (ref 3.5–5.1)
SODIUM: 132 mmol/L — AB (ref 135–145)

## 2015-02-22 LAB — I-STAT TROPONIN, ED: Troponin i, poc: 0.01 ng/mL (ref 0.00–0.08)

## 2015-02-22 MED ORDER — ASPIRIN 81 MG PO CHEW
324.0000 mg | CHEWABLE_TABLET | Freq: Once | ORAL | Status: AC
  Administered 2015-02-22: 324 mg via ORAL
  Filled 2015-02-22: qty 4

## 2015-02-22 MED ORDER — ASPIRIN 81 MG PO CHEW: 324.0000 mg | CHEWABLE_TABLET | ORAL | Status: DC

## 2015-02-22 MED ORDER — INSULIN ASPART 100 UNIT/ML ~~LOC~~ SOLN: 0.0000 [IU] | Freq: Three times a day (TID) | SUBCUTANEOUS | Status: DC

## 2015-02-22 MED ORDER — CEFTRIAXONE SODIUM IN DEXTROSE 20 MG/ML IV SOLN
1.0000 g | INTRAVENOUS | Status: DC
  Administered 2015-02-23 (×2): 1 g via INTRAVENOUS
  Filled 2015-02-22 (×3): qty 50

## 2015-02-22 MED ORDER — ONDANSETRON HCL 4 MG/2ML IJ SOLN: 4.0000 mg | Freq: Four times a day (QID) | INTRAMUSCULAR | Status: DC | PRN

## 2015-02-22 MED ORDER — INSULIN ASPART 100 UNIT/ML ~~LOC~~ SOLN
0.0000 [IU] | SUBCUTANEOUS | Status: DC
  Administered 2015-02-23: 15 [IU] via SUBCUTANEOUS
  Administered 2015-02-23: 8 [IU] via SUBCUTANEOUS
  Administered 2015-02-24: 11 [IU] via SUBCUTANEOUS

## 2015-02-22 MED ORDER — ASPIRIN EC 81 MG PO TBEC
81.0000 mg | DELAYED_RELEASE_TABLET | Freq: Every day | ORAL | Status: DC
  Administered 2015-02-23 – 2015-02-24 (×2): 81 mg via ORAL
  Filled 2015-02-22 (×2): qty 1

## 2015-02-22 MED ORDER — SODIUM CHLORIDE 0.9 % IV SOLN
INTRAVENOUS | Status: DC
  Administered 2015-02-22 – 2015-02-23 (×3): via INTRAVENOUS

## 2015-02-22 MED ORDER — ACETAMINOPHEN 325 MG PO TABS: 650.0000 mg | ORAL_TABLET | ORAL | Status: DC | PRN

## 2015-02-22 MED ORDER — INSULIN DETEMIR 100 UNIT/ML ~~LOC~~ SOLN
60.0000 [IU] | Freq: Two times a day (BID) | SUBCUTANEOUS | Status: DC
  Administered 2015-02-22: 60 [IU] via SUBCUTANEOUS
  Filled 2015-02-22 (×4): qty 0.6

## 2015-02-22 MED ORDER — HEPARIN SODIUM (PORCINE) 5000 UNIT/ML IJ SOLN
5000.0000 [IU] | Freq: Three times a day (TID) | INTRAMUSCULAR | Status: DC
  Administered 2015-02-22 – 2015-02-24 (×5): 5000 [IU] via SUBCUTANEOUS
  Filled 2015-02-22 (×6): qty 1

## 2015-02-22 MED ORDER — ASPIRIN 300 MG RE SUPP: 300.0000 mg | RECTAL | Status: DC

## 2015-02-22 MED ORDER — PRASUGREL HCL 10 MG PO TABS
10.0000 mg | ORAL_TABLET | Freq: Every day | ORAL | Status: DC
  Administered 2015-02-23 – 2015-02-24 (×2): 10 mg via ORAL
  Filled 2015-02-22 (×3): qty 1

## 2015-02-22 MED ORDER — MORPHINE SULFATE 4 MG/ML IJ SOLN
4.0000 mg | Freq: Once | INTRAMUSCULAR | Status: AC
  Administered 2015-02-22: 4 mg via INTRAVENOUS
  Filled 2015-02-22: qty 1

## 2015-02-22 MED ORDER — ONDANSETRON HCL 4 MG/2ML IJ SOLN
4.0000 mg | Freq: Four times a day (QID) | INTRAMUSCULAR | Status: DC | PRN
  Administered 2015-02-22: 4 mg via INTRAVENOUS
  Filled 2015-02-22: qty 2

## 2015-02-22 MED ORDER — SODIUM CHLORIDE 0.9 % IV BOLUS (SEPSIS)
1000.0000 mL | Freq: Once | INTRAVENOUS | Status: AC
  Administered 2015-02-22: 1000 mL via INTRAVENOUS

## 2015-02-22 MED ORDER — ASPIRIN EC 81 MG PO TBEC: 81.0000 mg | DELAYED_RELEASE_TABLET | Freq: Every day | ORAL | Status: DC

## 2015-02-22 MED ORDER — NITROGLYCERIN 0.4 MG SL SUBL: 0.4000 mg | SUBLINGUAL_TABLET | SUBLINGUAL | Status: DC | PRN

## 2015-02-22 MED ORDER — ONDANSETRON HCL 4 MG/2ML IJ SOLN
4.0000 mg | Freq: Once | INTRAMUSCULAR | Status: AC
  Administered 2015-02-22: 4 mg via INTRAVENOUS
  Filled 2015-02-22: qty 2

## 2015-02-22 NOTE — Progress Notes (Signed)
Placed pt. On CPAP auto titrate (min: 5, max: 15) via FFM (what pt. Wears at home) Pt. Is comfortable on these settings & states that she will place herself on CPAP before going to bed. Pt. Is aware to call if she needs assistance with CPAP.

## 2015-02-22 NOTE — ED Provider Notes (Signed)
CSN: [id number]     Arrival date & time 02/22/15  1224 History   First MD Initiated Contact with Patient 02/22/15 1224     Chief Complaint  Patient presents with  . Chest Pain  . Dizziness     (Consider location/radiation/quality/duration/timing/severity/associated sxs/prior Treatment) Patient is a 54 y.o. female presenting with chest pain and dizziness. The history is provided by the patient.  Chest Pain Pain location:  L chest Pain quality: aching   Pain radiates to:  L shoulder Pain radiates to the back: no   Pain severity:  Moderate Onset quality:  Gradual Duration:  4 days Timing:  Intermittent Progression:  Partially resolved Chronicity:  Recurrent Context: at rest   Relieved by:  Nothing Worsened by:  Nothing tried Associated symptoms: dizziness, nausea, shortness of breath and vomiting (3 times 2 days ago)   Associated symptoms: no abdominal pain and no fever   Dizziness Associated symptoms: chest pain, diarrhea, nausea, shortness of breath and vomiting (3 times 2 days ago)     Past Medical History  Diagnosis Date  . Obesity   . Diabetes mellitus   . Sleep apnea     CPAP  . Hypertension   . CAD (coronary artery disease)     s/p nstemi 05/2009 treated w drug-eluting stents  . Heart attack   . Arthritis    Past Surgical History  Procedure Laterality Date  . Cholecystectomy    . Hernia repair    . Coronary artery stent      2011  . Left heart catheterization with coronary angiogram N/A 12/25/2012    Procedure: LEFT HEART CATHETERIZATION WITH CORONARY ANGIOGRAM;  Surgeon: Sherren Mocha, MD;  Location: [facility] CATH LAB;  Service: Cardiovascular;  Laterality: N/A;   Family History  Problem Relation Age of Onset  . Coronary artery disease Mother     in her mid 4s  . Heart attack Father     in his mid 5s  . COPD Father   . Hypertension Mother   . Cancer Mother   . Diabetes Sister   . Coronary artery disease Sister     valve replacement  . Stroke Sister     multiple   History  Substance Use Topics  . Smoking status: Former Smoker -- 0.50 packs/day for 1 years    Types: Cigarettes, Cigars    Quit date: 09/03/1986  . Smokeless tobacco: Never Used     Comment: 1 YEAR IN COLLEGE  . Alcohol Use: No   OB History    No data available     Review of Systems  Constitutional: Negative for fever.  Respiratory: Positive for shortness of breath.   Cardiovascular: Positive for chest pain.  Gastrointestinal: Positive for nausea, vomiting (3 times 2 days ago) and diarrhea. Negative for abdominal pain.  Neurological: Positive for dizziness.  All other systems reviewed and are negative.     Allergies  Clopidogrel bisulfate and Hydrocodeine  Home Medications   Prior to Admission medications   Medication Sig Start Date End Date Taking? Authorizing Provider  aspirin (ASPIR-81) 81 MG EC tablet Take 81 mg by mouth daily.      Historical Provider, MD  atorvastatin (LIPITOR) 80 MG tablet Take 1 tablet (80 mg total) by mouth daily. 10/23/13   Sherren Mocha, MD  furosemide (LASIX) 20 MG tablet Take 1 tablet (20 mg total) by mouth daily. 10/23/13   Sherren Mocha, MD  insulin detemir (LEVEMIR FLEXPEN) 100 UNIT/ML injection Inject 60 Units into the  skin 2 (two) times daily.     Historical Provider, MD  insulin lispro (HUMALOG) 100 UNIT/ML injection Inject 30 Units into the skin 3 (three) times daily before meals.    Historical Provider, MD  isosorbide mononitrate (IMDUR) 30 MG 24 hr tablet Take 1 tablet (30 mg total) by mouth daily. 10/23/13   Sherren Mocha, MD  metFORMIN (GLUCOPHAGE) 500 MG tablet Take 500 mg by mouth 2 (two) times daily with a meal. 2 tablets BID    Historical Provider, MD  metoprolol tartrate (LOPRESSOR) 25 MG tablet Take 1 tablet (25 mg total) by mouth 2 (two) times daily. 10/23/13   Sherren Mocha, MD  nitroGLYCERIN (NITROSTAT) 0.4 MG SL tablet Place 1 tablet (0.4 mg total) under the tongue every 5 (five) minutes as needed for chest  pain. 10/23/13   Sherren Mocha, MD  prasugrel (EFFIENT) 10 MG TABS tablet Take 1 tablet (10 mg total) by mouth daily. 03/04/14   Sherren Mocha, MD  quinapril (ACCUPRIL) 40 MG tablet Take 40 mg by mouth daily.     Historical Provider, MD  sitaGLIPtin (JANUVIA) 50 MG tablet Take 50 mg by mouth daily.    Historical Provider, MD  spironolactone (ALDACTONE) 25 MG tablet Take 25 mg by mouth daily.    Historical Provider, MD   BP 78/46 mmHg  Pulse 80  Temp(Src) 98.4 F (36.9 C) (Oral)  Resp 24  Ht 5\' 2"  (1.575 m)  Wt 273 lb 4 oz (123.945 kg)  BMI 49.97 kg/m2  SpO2 96% Physical Exam  Constitutional: She is oriented to person, place, and time. She appears well-developed and well-nourished. No distress.  HENT:  Head: Normocephalic and atraumatic.  Mouth/Throat: Oropharynx is clear and moist.  Eyes: EOM are normal. Pupils are equal, round, and reactive to light.  Neck: Normal range of motion. Neck supple.  Cardiovascular: Normal rate and regular rhythm.  Exam reveals no friction rub.   No murmur heard. Pulmonary/Chest: Effort normal and breath sounds normal. No respiratory distress. She has no wheezes. She has no rales. She exhibits no tenderness.  Abdominal: Soft. She exhibits no distension. There is no tenderness. There is no rebound.  Musculoskeletal: Normal range of motion. She exhibits no edema.  Neurological: She is alert and oriented to person, place, and time.  Skin: No rash noted. She is not diaphoretic.  Nursing note and vitals reviewed.   ED Course  Procedures (including critical care time) Labs Review Labs Reviewed  [place], ED    Imaging Review Dg Chest Portable 1 View  02/22/2015   CLINICAL DATA:  54 year old female with chest pain and dizziness since this morning. Initial encounter.  EXAM: PORTABLE CHEST - 1 VIEW  COMPARISON:  01/17/2011 and earlier.  FINDINGS: Portable AP semi upright view at 1259 hrs.  Large body habitus. Continued low lung volumes. Moderate elevation of the right hemidiaphragm is stable. Stable cardiac size and mediastinal contours. Allowing for portable technique, the lungs are clear. No pneumothorax.  IMPRESSION: Low lung volumes, otherwise no acute cardiopulmonary abnormality.   Electronically Signed   By: Genevie Ann M.D.   On: 02/22/2015 13:42     EKG Interpretation   Date/Time:  Tuesday February 22 2015 12:32:06 EDT Ventricular Rate:  77 PR Interval:  154 QRS Duration: 133 QT Interval:  410 QTC Calculation: 464 R Axis:   -113 Text Interpretation:  Sinus rhythm Right bundle branch block Inferior  infarct, old No significant change  since last tracing Confirmed by [facility]   MD, Isaul Landi [phone]) on 02/22/2015 12:37:26 PM      MDM   Final diagnoses:  Chest pain, unspecified chest pain type  Bilateral thoracic back pain    54 year old female history of coronary artery disease with stents, diabetes, high blood pressure presents with back pain, chest pain, shortness breath, dizziness for the past 3-4 days. She had vomiting 32 days ago. Yesterday she started to have back pain which is similar to her prior cardiac disease. She took 2 nitros with relief. 2 nitros this morning were not helpful in relieving her pain. She has been having financial difficulties and has had no insurance so she's been off of her Effient for the past 3 months. She has continued to take her diabetes medications. Here softer BPs, on my exam 92/54. Will give fluid, pain meds. EKG similar to prior.  Workup unremarkable. CP continued, with hx of stents, will admit.   Evelina Bucy, MD 02/22/15 [phone]

## 2015-02-22 NOTE — ED Notes (Addendum)
Pt was at church, she has been having CP, SOB, and dizziness for 3 days.  Pt called EMS to get a further evaluation as to what is going on with her.  Pt has CHF, Hypertension and DM, but has recently lost her insurance and hasn't been able to afford her medication.   Pt took 2 nitro yesterday which relieved her CP.  She took 2 nitro today without relief.  Pt was given 500cc of Normal saline in route, 4mg  of Zofran, 324mg  of ASA. VS: are as follows: BP: 92/56 HR:76 Resp:20 O2Sat: 96% on RA, CBG: 325

## 2015-02-22 NOTE — Progress Notes (Signed)
I was called by the patient's RN, Janett Billow in regards to a diet order. I asked her to keep the patient NPO except meds per the order I had entered earlier in the ER and asked to to keep her on a Q 4 Insulin sliding scale. Unfortunately, she has discontinued my NPO order and started a diet for the patient and placed her on a Mealtime insulin sliding scale. These were not my orders.

## 2015-02-22 NOTE — H&P (Addendum)
[facility] History and Physical  Brenda [name] OIB:[id number] DOB: 07-31-1961 DOA: 02/22/2015   PCP: Harvie Junior, MD    Chief Complaint: Chest pain  HPI: Brenda [name] is a 54 y.o. female past medical history of coronary artery disease status post stent to the LAD in 2010, diabetes mellitus on insulin, morbid obesity, obstructive sleep apnea and hypertension. The patient comes in with pain across her chest and in her jaw today. It started at 9:00 this morning and despite taking nitroglycerin at home, it did not resolve. It only resolved when she received morphine in the ER around 1:00. She states that she's been having pain across her shoulders on and off for the past few days now she also went into her jaw yesterday. She is also had episodes of vomiting that started about 3 days ago and has had severe nausea. She often has loose stools which she contributes to her metformin but feels that they have been worse than usual over the past few days. No complaints of heartburn. No fever chills or abdominal pain noted. Of note, she has not taken any of her cardiac medications in months due to lack of insurance. The only medications that she has been taking are Levemir which was given to her by a friend, aspirin, Novolin and metformin.   General: The patient denies anorexia, fever, weight loss Cardiac: Denies chest pain, syncope, palpitations, pedal edema  Respiratory: Denies cough,  Wheezing- states she has chronic shortness of breath as a result of her weight GI: Per history of present illness GU: Denies hematuria, incontinence- had pain when urinating yesterday started on AZO tabs she's not sure if she's had pain when urinating today Musculoskeletal: Denies arthritis  Skin: Denies suspicious skin lesions Neurologic: Denies focal weakness or numbness, change in vision Psychiatry: Denies depression or anxiety. Hematologic: no easy bruising or bleeding  All other systems reviewed and  found to be negative.  Past Medical History  Diagnosis Date  . Obesity   . Diabetes mellitus   . Sleep apnea     CPAP  . Hypertension   . CAD (coronary artery disease)     s/p nstemi 05/2009 treated w drug-eluting stents  . Heart attack   . Arthritis     Past Surgical History  Procedure Laterality Date  . Cholecystectomy    . Hernia repair    . Coronary artery stent      2011  . Left heart catheterization with coronary angiogram N/A 12/25/2012    Procedure: LEFT HEART CATHETERIZATION WITH CORONARY ANGIOGRAM;  Surgeon: Sherren Mocha, MD;  Location: [facility] CATH LAB;  Service: Cardiovascular;  Laterality: N/A;    Social History: History   Social History  . Marital Status: Married    Spouse Name: N/A  . Number of Children: 2  . Years of Education: N/A   Occupational History  . Housewife    Social History Main Topics  . Smoking status: Former Smoker -- 0.50 packs/day for 1 years    Types: Cigarettes, Cigars    Quit date: 09/03/1986  . Smokeless tobacco: Never Used     Comment: 1 YEAR IN COLLEGE  . Alcohol Use: No  . Drug Use: No  . Sexual Activity: Not on file   Other Topics Concern  . Not on file   Social History Narrative    Lives at home with family   Allergies  Allergen Reactions  . Clopidogrel Bisulfate     REACTION: rash  . Hydrocodeine [Dihydrocodeine]  Family history:   Family History  Problem Relation Age of Onset  . Coronary artery disease Mother     in her mid 15s  . Heart attack Father     in his mid 50s  . COPD Father   . Hypertension Mother   . Cancer Mother   . Diabetes Sister   . Coronary artery disease Sister     valve replacement  . Stroke Sister     multiple      Prior to Admission medications   Medication Sig Start Date End Date Taking? Authorizing Provider  aspirin EC 81 MG tablet Take 81 mg by mouth daily.   Yes Historical Provider, MD  CRANBERRY PO Take 2 tablets by mouth daily.   Yes Historical Provider, MD   insulin detemir (LEVEMIR FLEXPEN) 100 UNIT/ML injection Inject 60 Units into the skin 2 (two) times daily.    Yes Historical Provider, MD  insulin NPH Human (HUMULIN N,NOVOLIN N) 100 UNIT/ML injection Inject 30 Units into the skin 3 (three) times daily before meals.   Yes Historical Provider, MD  metFORMIN (GLUCOPHAGE) 500 MG tablet Take 500 mg by mouth 2 (two) times daily with a meal. 2 tablets BID   Yes Historical Provider, MD  nitroGLYCERIN (NITROSTAT) 0.4 MG SL tablet Place 1 tablet (0.4 mg total) under the tongue every 5 (five) minutes as needed for chest pain. 10/23/13  Yes Sherren Mocha, MD  Phenazopyridine HCl (AZO TABS PO) Take 1 tablet by mouth daily as needed (UTI).   Yes Historical Provider, MD  atorvastatin (LIPITOR) 80 MG tablet Take 1 tablet (80 mg total) by mouth daily. Patient not taking: Reported on 02/22/2015 10/23/13   Sherren Mocha, MD  furosemide (LASIX) 20 MG tablet Take 1 tablet (20 mg total) by mouth daily. Patient not taking: Reported on 02/22/2015 10/23/13   Sherren Mocha, MD  insulin lispro (HUMALOG) 100 UNIT/ML injection Inject 30 Units into the skin 3 (three) times daily before meals.    Historical Provider, MD  isosorbide mononitrate (IMDUR) 30 MG 24 hr tablet Take 1 tablet (30 mg total) by mouth daily. Patient not taking: Reported on 02/22/2015 10/23/13   Sherren Mocha, MD  metoprolol tartrate (LOPRESSOR) 25 MG tablet Take 1 tablet (25 mg total) by mouth 2 (two) times daily. Patient not taking: Reported on 02/22/2015 10/23/13   Sherren Mocha, MD  prasugrel (EFFIENT) 10 MG TABS tablet Take 1 tablet (10 mg total) by mouth daily. Patient not taking: Reported on 02/22/2015 03/04/14   Sherren Mocha, MD  quinapril (ACCUPRIL) 40 MG tablet Take 40 mg by mouth daily.     Historical Provider, MD  sitaGLIPtin (JANUVIA) 50 MG tablet Take 50 mg by mouth daily.    Historical Provider, MD  spironolactone (ALDACTONE) 25 MG tablet Take 25 mg by mouth daily.    Historical Provider, MD      Physical Exam: Filed Vitals:   02/22/15 1415 02/22/15 1430 02/22/15 1500 02/22/15 1515  BP: 97/55 95/57 102/60 90/52  Pulse: 79 76 73 78  Temp:      TempSrc:      Resp:      Height:      Weight:      SpO2: 92% 86% 89% 95%     General: Awake alert oriented, no acute distress, morbidly obese HEENT: Normocephalic and Atraumatic, Mucous membranes pink                PERRLA; EOM intact; No scleral icterus,  Nares: Patent, Oropharynx: Clear, Fair Dentition                 Neck: FROM, no cervical lymphadenopathy, thyromegaly, carotid bruit or JVD;  Breasts: deferred CHEST WALL: No tenderness  CHEST: Normal respiration, clear to auscultation bilaterally  HEART: Regular rate and rhythm; no murmurs rubs or gallops  BACK: No kyphosis or scoliosis; no CVA tenderness  GI: Positive Bowel Sounds, soft, non-tender; no masses, no organomegaly Rectal Exam: deferred MSK: No cyanosis, clubbing, or edema Genitalia: not examined  SKIN:  no rash or ulceration  CNS: Alert and Oriented x 4, Nonfocal exam, CN 2-12 intact  Labs on Admission:  Basic Metabolic Panel:  Recent Labs Lab 02/22/15 1315  NA 132*  K 4.7  CL 94*  CO2 22  GLUCOSE 400*  BUN 26*  CREATININE 1.57*  CALCIUM 9.3   Liver Function Tests: No results for input(s): AST, ALT, ALKPHOS, BILITOT, PROT, ALBUMIN in the last 168 hours. No results for input(s): LIPASE, AMYLASE in the last 168 hours. No results for input(s): AMMONIA in the last 168 hours. CBC:  Recent Labs Lab 02/22/15 1315  WBC 17.3*  HGB 16.1*  HCT 46.0  MCV 84.7  PLT 280   Cardiac Enzymes: No results for input(s): CKTOTAL, CKMB, CKMBINDEX, TROPONINI in the last 168 hours.  BNP (last 3 results)  Recent Labs  02/22/15 1315  BNP 32.7    ProBNP (last 3 results) No results for input(s): PROBNP in the last 8760 hours.  CBG: No results for input(s): GLUCAP in the last 168 hours.  Radiological Exams on Admission: Dg Chest  Portable 1 View  02/22/2015   CLINICAL DATA:  54 year old female with chest pain and dizziness since this morning. Initial encounter.  EXAM: PORTABLE CHEST - 1 VIEW  COMPARISON:  01/17/2011 and earlier.  FINDINGS: Portable AP semi upright view at 1259 hrs. Large body habitus. Continued low lung volumes. Moderate elevation of the right hemidiaphragm is stable. Stable cardiac size and mediastinal contours. Allowing for portable technique, the lungs are clear. No pneumothorax.  IMPRESSION: Low lung volumes, otherwise no acute cardiopulmonary abnormality.   Electronically Signed   By: Genevie Ann M.D.   On: 02/22/2015 13:42    EKG: Independently reviewed. Sinus rhythm without any ST-T wave changes  Assessment/Plan Principal Problem:   Chest pain/coronary artery disease -Check 3 sets of troponin-continue aspirin-hold off on beta blocker and Imdur due to hypotension -resume Effient-allergic to Plavix -Resume statin once vomiting improves  Active Problems: Leukocytosis Possibly related to vomiting and underlying gastroenteritis-also complained of pain with urinating therefore will order a UA-hold off on antibiotics for now  Vomiting/diarrhea -Possibly gastroenteritis-checks for C. difficile colitis-nothing by mouth except for sips with meds for now-when necessary Zofran and IV fluids ordered  Mild Polycythemia - hemoconcentration? - follow with hydration    Sleep apnea -C Pap for bedtime    ARF (acute renal failure)/dehydration/ hypotension -Hydrate with normal saline and follow    Hyperglycemia -CBG 400 in the ER -She took her Levemir at 12 in the afternoon-sugars should be much better at this point which makes me suspect that she may have an underlying infection -Continue Levemir at home dose and place on a moderate dose sliding scale every 4 hours while nothing by mouth - check A1c    Consulted:   Code Status: Full code  Family Communication:   DVT Prophylaxis:Heparin  Time spent:  71 min  [place], MD [facility]  If 7PM-7AM, please contact  night-coverage [email or web address] 02/22/2015, 3:52 PM

## 2015-02-22 NOTE — ED Notes (Signed)
Attempted report to 3 W. 

## 2015-02-23 DIAGNOSIS — E118 Type 2 diabetes mellitus with unspecified complications: Secondary | ICD-10-CM | POA: Diagnosis present

## 2015-02-23 DIAGNOSIS — E1165 Type 2 diabetes mellitus with hyperglycemia: Secondary | ICD-10-CM

## 2015-02-23 DIAGNOSIS — N390 Urinary tract infection, site not specified: Secondary | ICD-10-CM

## 2015-02-23 DIAGNOSIS — Z794 Long term (current) use of insulin: Secondary | ICD-10-CM | POA: Diagnosis present

## 2015-02-23 DIAGNOSIS — R0789 Other chest pain: Principal | ICD-10-CM

## 2015-02-23 DIAGNOSIS — E1129 Type 2 diabetes mellitus with other diabetic kidney complication: Secondary | ICD-10-CM | POA: Diagnosis present

## 2015-02-23 DIAGNOSIS — I959 Hypotension, unspecified: Secondary | ICD-10-CM | POA: Diagnosis present

## 2015-02-23 LAB — BASIC METABOLIC PANEL
ANION GAP: 11 (ref 5–15)
BUN: 30 mg/dL — ABNORMAL HIGH (ref 6–20)
CO2: 26 mmol/L (ref 22–32)
Calcium: 8.9 mg/dL (ref 8.9–10.3)
Chloride: 100 mmol/L — ABNORMAL LOW (ref 101–111)
Creatinine, Ser: 1.93 mg/dL — ABNORMAL HIGH (ref 0.44–1.00)
GFR calc Af Amer: 34 mL/min — ABNORMAL LOW (ref >60)
GFR, EST NON AFRICAN AMERICAN: 29 mL/min — AB (ref >60)
Glucose, Bld: 66 mg/dL (ref 65–99)
Potassium: 4.3 mmol/L (ref 3.5–5.1)
Sodium: 137 mmol/L (ref 135–145)

## 2015-02-23 LAB — HEMOGLOBIN A1C
Hgb A1c MFr Bld: 13.5 % — ABNORMAL HIGH (ref 4.8–5.6)
MEAN PLASMA GLUCOSE: 341 mg/dL

## 2015-02-23 LAB — GLUCOSE, CAPILLARY
GLUCOSE-CAPILLARY: 61 mg/dL — AB (ref 65–99)
Glucose-Capillary: 106 mg/dL — ABNORMAL HIGH (ref 65–99)
Glucose-Capillary: 110 mg/dL — ABNORMAL HIGH (ref 65–99)
Glucose-Capillary: 130 mg/dL — ABNORMAL HIGH (ref 65–99)
Glucose-Capillary: 251 mg/dL — ABNORMAL HIGH (ref 65–99)
Glucose-Capillary: 386 mg/dL — ABNORMAL HIGH (ref 65–99)
Glucose-Capillary: 72 mg/dL (ref 65–99)
Glucose-Capillary: 74 mg/dL (ref 65–99)

## 2015-02-23 LAB — CBC
HEMATOCRIT: 44.8 % (ref 36.0–46.0)
Hemoglobin: 15.1 g/dL — ABNORMAL HIGH (ref 12.0–15.0)
MCH: 28.7 pg (ref 26.0–34.0)
MCHC: 33.7 g/dL (ref 30.0–36.0)
MCV: 85.2 fL (ref 78.0–100.0)
PLATELETS: 317 10*3/uL (ref 150–400)
RBC: 5.26 MIL/uL — ABNORMAL HIGH (ref 3.87–5.11)
RDW: 12.2 % (ref 11.5–15.5)
WBC: 14.3 10*3/uL — AB (ref 4.0–10.5)

## 2015-02-23 LAB — TROPONIN I

## 2015-02-23 MED ORDER — INSULIN DETEMIR 100 UNIT/ML ~~LOC~~ SOLN
50.0000 [IU] | Freq: Two times a day (BID) | SUBCUTANEOUS | Status: DC
  Administered 2015-02-23 – 2015-02-24 (×2): 50 [IU] via SUBCUTANEOUS
  Filled 2015-02-23 (×3): qty 0.5

## 2015-02-23 MED ORDER — DEXTROSE 50 % IV SOLN
INTRAVENOUS | Status: AC
  Administered 2015-02-23: 25 mL
  Filled 2015-02-23: qty 50

## 2015-02-23 NOTE — Care Management Note (Addendum)
Case Management Note  Patient Details  Name: Brenda [name] MRN: [id number] Date of Birth: December 02, 1960  Subjective/Objective:  Pt admitted for chest pain. Pt is from home with daughter. No insurance and per pt she has not seen PCP for a while due to no insurance coverage. Per pt she is not working.  Pt states she has been getting generic medications from [facility] for diabetes for 25.00.                 Action/Plan: Pt will benefit from the [facility]). CM did call Carmela Hurt to see if pt is eligible for services. [facility] to set up hospital follow up and assistance with medications. CM will continue to monitor. No further needs @ this time.    Expected Discharge Date:                  Expected Discharge Plan:  Home/Self Care  In-House Referral:  Financial Counselor  Discharge planning Services  CM Consult, Follow-up appt scheduled, Medication Assistance, [facility])  Post Acute Care Choice:    Choice offered to:     DME Arranged:    DME Agency:     HH Arranged:    HH Agency:     Status of Service:  In process, will continue to follow  Medicare Important Message Given:  No Date Medicare IM Given:    Medicare IM give by:    Date Additional Medicare IM Given:    Additional Medicare Important Message give by:     If discussed at [place] of Stay Meetings, dates discussed:    Additional Comments:  [name], RN 02/23/2015, 11:42 AM

## 2015-02-23 NOTE — Progress Notes (Signed)
Patient's blood pressure systolic 70'W during the night. Manual blood pressure taken 90/60. Patient asymptomatic. Schorr, MD notified. Will continue to monitor.

## 2015-02-23 NOTE — Procedures (Signed)
Pt placed on CPAP with auto mode setting (max-15, min-5).  Pt is comfortable and resting well.

## 2015-02-23 NOTE — Progress Notes (Signed)
Patient CBG 61. Patient is asymptomatic. Initiated hypoglycemia protocol. Gave patient 25 mL of 50% Dextrose. Will recheck CBG in 15 minutes. Will continue to monitor.

## 2015-02-23 NOTE — Consult Note (Signed)
CARDIOLOGY CONSULT NOTE       Patient ID: Brenda [name] MRN: [id number] DOB/AGE: May 25, 1961 54 y.o.  Admit date: 02/22/2015 Referring Physician:  Conley Canal Primary Physician: Harvie Junior, MD Primary Cardiologist:  Sherren Mocha Reason for Consultation:  Chest Pain  Principal Problem:   Chest pain Active Problems:   Sleep apnea   Morbid obesity   Vomiting   Leukocytosis   ARF (acute renal failure)   Hyperglycemia   HPI:  54 y.o. obese diabetic admitted with multiple complaints 6/21.  She has been out of most of her meds for a few months Does not have insurance.  Only taking some insulin. She initially presented in 2000 and with a non-ST elevation infarction and underwent complex bifurcation stenting of the LAD and first diagonal. She's been maintained on long-term dual antiplatelet therapy with aspirin and effient. She was allergic to Plavix. When I saw her last in April 2014 she complained of exertional chest discomfort and dyspnea. She underwent repeat heart catheterization demonstrating severe in-stent restenosis in the diagonal branch and moderate in-stent restenosis of the LAD. Because of challenges around procedural technique with her body habitus and complexity of her coronary anatomy, medical therapy was recommended as an initial approach Started with 2 days of nausea.  Then had back pain.  This pain then radiated to chest/jaw.  She took nitro with variable relief.  Pains intermittent but throughout day. Not associated with palpitations syncope or dyspnea  Has OSA and wears CPAP.  Pain not pleuritic.  Does have some chronic back pain.  Pain free since in hospital R/O no arrhythmias on telemetry and no acute ECG changes.  CRF;s HTN, elevaated lipids and DM.    ROS All other systems reviewed and negative except as noted above  Past Medical History  Diagnosis Date  . Obesity   . Hypertension   . CAD (coronary artery disease)     s/p nstemi 05/2009 treated w drug-eluting  stents  . Hypercholesterolemia   . CHF (congestive heart failure)   . Heart attack 2010  . Sleep apnea     "suppose to wear mask; I don't" (02/22/2015)  . Diabetes mellitus     "started when I was pregnant; not sure if it was type 1 or type 2"   . Arthritis     "back, hands, hips" (02/22/2015)  . Depression     Family History  Problem Relation Age of Onset  . Coronary artery disease Mother     in her mid 33s  . Heart attack Father     in his mid 88s  . COPD Father   . Hypertension Mother   . Cancer Mother   . Diabetes Sister   . Coronary artery disease Sister     valve replacement  . Stroke Sister     multiple    History   Social History  . Marital Status: Married    Spouse Name: N/A  . Number of Children: 2  . Years of Education: N/A   Occupational History  . Housewife    Social History Main Topics  . Smoking status: Former Smoker -- 0.50 packs/day for 1 years    Types: Cigarettes, Cigars    Quit date: 09/03/1986  . Smokeless tobacco: Never Used  . Alcohol Use: No  . Drug Use: No  . Sexual Activity: Not Currently   Other Topics Concern  . Not on file   Social History Narrative    Past Surgical History  Procedure Laterality Date  .  Left heart catheterization with coronary angiogram N/A 12/25/2012    Procedure: LEFT HEART CATHETERIZATION WITH CORONARY ANGIOGRAM;  Surgeon: Sherren Mocha, MD;  Location: [facility] CATH LAB;  Service: Cardiovascular;  Laterality: N/A;  . Cholecystectomy open  1989  . Umbilical hernia repair  1990's  . Hernia repair    . Coronary angioplasty with stent placement  2011  . Cardiac catheterization  12/2012     . aspirin EC  81 mg Oral Daily  . cefTRIAXone (ROCEPHIN)  IV  1 g Intravenous Q24H  . heparin  5,000 Units Subcutaneous 3 times per day  . insulin aspart  0-15 Units Subcutaneous Q4H  . insulin detemir  60 Units Subcutaneous BID  . prasugrel  10 mg Oral Daily   . sodium chloride 150 mL/hr at 02/22/15 1800    Physical  Exam: Blood pressure 90/60, pulse 76, temperature 97.8 F (36.6 C), temperature source Oral, resp. rate 19, height 5' 2"  (1.575 m), weight 126.236 kg (278 lb 4.8 oz), SpO2 96 %.   Affect appropriate Obese white female  HEENT: normal  Wearing CPAP Neck supple with no adenopathy JVP normal no bruits no thyromegaly Lungs clear with no wheezing and good diaphragmatic motion Heart:  S1/S2 no murmur, no rub, gallop or click PMI normal Abdomen: benighn, BS positve, no tenderness, no AAA no bruit.  No HSM or HJR Distal pulses intact with no bruits No edema Neuro non-focal Skin warm and dry No muscular weakness   Labs:   Lab Results  Component Value Date   WBC 14.3* 02/23/2015   HGB 15.1* 02/23/2015   HCT 44.8 02/23/2015   MCV 85.2 02/23/2015   PLT 317 02/23/2015    Recent Labs Lab 02/23/15 0306  NA 137  K 4.3  CL 100*  CO2 26  BUN 30*  CREATININE 1.93*  CALCIUM 8.9  GLUCOSE 66   Lab Results  Component Value Date   CKTOTAL 189* 01/18/2011   CKMB 4.1* 01/18/2011   TROPONINI <0.03 02/23/2015    Lab Results  Component Value Date   CHOL 157 10/25/2011   CHOL 238* 02/22/2011   CHOL 156 01/18/2011   Lab Results  Component Value Date   HDL 47.70 10/25/2011   HDL 50.60 02/22/2011   HDL 37* 01/18/2011   Lab Results  Component Value Date   LDLCALC 84 10/25/2011   LDLCALC  01/18/2011    73        Total Cholesterol/HDL:CHD Risk Coronary Heart Disease Risk Table                     Men   Women  1/2 Average Risk   3.4   3.3  Average Risk       5.0   4.4  2 X Average Risk   9.6   7.1  3 X Average Risk  23.4   11.0        Use the calculated Patient Ratio above and the CHD Risk Table to determine the patient's CHD Risk.        ATP III CLASSIFICATION (LDL):  <100     mg/dL   Optimal  100-129  mg/dL   Near or Above                    Optimal  130-159  mg/dL   Borderline  160-189  mg/dL   High  >190     mg/dL   Very High   LDLCALC  03/09/2010  75         Total Cholesterol/HDL:CHD Risk Coronary Heart Disease Risk Table                     Men   Women  1/2 Average Risk   3.4   3.3  Average Risk       5.0   4.4  2 X Average Risk   9.6   7.1  3 X Average Risk  23.4   11.0        Use the calculated Patient Ratio above and the CHD Risk Table to determine the patient's CHD Risk.        ATP III CLASSIFICATION (LDL):  <100     mg/dL   Optimal  100-129  mg/dL   Near or Above                    Optimal  130-159  mg/dL   Borderline  160-189  mg/dL   High  >190     mg/dL   Very High   Lab Results  Component Value Date   TRIG 126.0 10/25/2011   TRIG 220.0* 02/22/2011   TRIG 229* 01/18/2011   Lab Results  Component Value Date   CHOLHDL 3 10/25/2011   CHOLHDL 5 02/22/2011   CHOLHDL 4.2 01/18/2011   Lab Results  Component Value Date   LDLDIRECT 135.9 02/22/2011      Radiology: Dg Chest Portable 1 View  02/22/2015   CLINICAL DATA:  54 year old female with chest pain and dizziness since this morning. Initial encounter.  EXAM: PORTABLE CHEST - 1 VIEW  COMPARISON:  01/17/2011 and earlier.  FINDINGS: Portable AP semi upright view at 1259 hrs. Large body habitus. Continued low lung volumes. Moderate elevation of the right hemidiaphragm is stable. Stable cardiac size and mediastinal contours. Allowing for portable technique, the lungs are clear. No pneumothorax.  IMPRESSION: Low lung volumes, otherwise no acute cardiopulmonary abnormality.   Electronically Signed   By: Genevie Ann M.D.   On: 02/22/2015 13:42    EKG:  SR RBBB poor R wave progression rate 72   ASSESSMENT AND PLAN:  Chest Pain:  Atypical associated with a myriad of other complaints and not on medical Rx  Not a good candidate for noninvasive evaluation and currently major issue is affording her meds.  Continue ASA and effient Will arrange outpatient f/u with Dr Burt Knack.  No inpatient evaluation unless pains recur OSA:  Compliant with CPAP Weight loss seems unlikely  Hypotension:  In  setting of elevated BUN/Cr would hydrate  Likely osmotic from glycosuria DM:  A1c 13.5  Affording meds and testing an issue. Social Service consult continue insulin coverage UTI:  On ceftriaxone ? Change to oral BP soft hydarate Chol:  Should be on generic statin   Signed: Jenkins Rouge 02/23/2015, 7:30 AM

## 2015-02-23 NOTE — Progress Notes (Addendum)
Rounding Note:  Spoke with patient about diabetes and home regimen for diabetes control. Patient reports that she is not followed by anyone for diabetes management and She currently takes Over the counter insulin at [facility] NPH 60 units BID, Novolin R 30 units TID. Patient reports that this regimen is not controlling her glucose levels and she is seeing her glucose levels in the 300 range.  Inquired about knowledge about A1C and patient reports that she does know what an A1C is. Discussed A1C results (13.5% on 02/22/15) and basic home care, importance of maintaining good CBG control to prevent long-term and short-term complications. Discussed impact of nutrition, exercise, stress, sickness, and medications on diabetes control.  Patient reports that she does not completely follow a DM diet at home. Gave patient Diabetes meal planning guide and discussed healthier low carb options for meals and going out to eat. Discussed carbohydrates, carbohydrate goals per day and meal, along with portion sizes.  Patient verbalized understanding of information discussed and she states that she has no further questions at this time related to diabetes.   I discussed discharge planning with patient with her DM medications. If patient can get Medicaid she will need to be on Lantus SolaStar pen and Novolog Flexpen at discharge. If patient will not be able to receive Medicaid she will need to be on Toujeo which the [employer] and [employer] have. Patient will have to activate the Toujeo card by calling the number on it or activate it online.  I gave patient a Toujeo discount card to get 12 months of Toujeo for a copay of $15 dollars a month (This can be used with no Biomedical engineer).    Patient would need a prescription for this at discharge    Dosing for Toujeo should be 50 units BID  Patient can continue to take the over the counter Novolin R for meal coverage but would NOT recommend current home dose  of 30 units TID. Would have to see trend from here to see her requirement from her meals on adjusted basal dose.   Without insurance patient cannot be on Levemir and I do not think 70/30 would be appropriate due to her glucose levels not being controlled prior to admission on NPH.  Thanks,  Tama Headings RN, MSN, [facility] Inpatient Diabetes Coordinator Team Pager [phone] (8AM-5PM)

## 2015-02-23 NOTE — Progress Notes (Addendum)
[facility] PROGRESS NOTE  Brenda [name] LKG:[id number] DOB: 1961/03/20 DOA: 02/22/2015 PCP: Harvie Junior, MD  Assessment/Plan:  Principal Problem:   Chest pain: no further w/u per cardiology. troponins negative. Resume ASA effient. Needs medication assistance and oupt f/u. Active Problems:   Sleep apnea: cpap   Morbid obesity   Vomiting likely viral gastroenteritis. Improving. No stool since admission. Cancel enteric precautions and C. difficile PCR.   ARF (acute renal failure):  Secondary to prerenal azotemia and hypotension: Continue IV fluids but decrease to 100 cc/hour. Reportedly has a history of CHF , but I don't see any echo report of this on last cardiology note.   DM type 2, uncontrolled, with renal complications:  Has had some relative lows on 60 of Levemir twice a day. Will change to 50 twice a day.    Hypotension:  Secondary to UTI, volume depletion from vomiting    UTI (urinary tract infection):  On Rocephin. Patient does endorse dysuria. Culture pending.    Code Status:  full Family Communication:   Disposition Plan:  home  Consultants:    Procedures:     Antibiotics:    HPI/Subjective:   Objective: Filed Vitals:   02/23/15 0738  BP: 86/66  Pulse: 76  Temp: 98.3 F (36.8 C)  Resp:     Intake/Output Summary (Last 24 hours) at 02/23/15 0903 Last data filed at 02/23/15 0400  Gross per 24 hour  Intake 1837.5 ml  Output    650 ml  Net 1187.5 ml   Filed Weights   02/22/15 1232 02/23/15 0400  Weight: 123.945 kg (273 lb 4 oz) 126.236 kg (278 lb 4.8 oz)    Exam:   General:  Obese, a ando  Cardiovascular: RRR without MGR  Respiratory: CTA without WRR  Abdomen: s, nt nd  Ext: trace edema  Basic Metabolic Panel:  Recent Labs Lab 02/22/15 1315 02/22/15 1542 02/23/15 0306  NA 132*  --  137  K 4.7  --  4.3  CL 94*  --  100*  CO2 22  --  26  GLUCOSE 400*  --  66  BUN 26*  --  30*  CREATININE 1.57* 1.93* 1.93*  CALCIUM  9.3  --  8.9   Liver Function Tests: No results for input(s): AST, ALT, ALKPHOS, BILITOT, PROT, ALBUMIN in the last 168 hours. No results for input(s): LIPASE, AMYLASE in the last 168 hours. No results for input(s): AMMONIA in the last 168 hours. CBC:  Recent Labs Lab 02/22/15 1315 02/23/15 0306  WBC 17.3* 14.3*  HGB 16.1* 15.1*  HCT 46.0 44.8  MCV 84.7 85.2  PLT 280 317   Cardiac Enzymes:  Recent Labs Lab 02/22/15 2134 02/23/15 0306  TROPONINI <0.03 <0.03   BNP (last 3 results)  Recent Labs  02/22/15 1315  BNP 32.7    ProBNP (last 3 results) No results for input(s): PROBNP in the last 8760 hours.  CBG:  Recent Labs Lab 02/23/15 0007 02/23/15 0302 02/23/15 0338 02/23/15 0411 02/23/15 0740  GLUCAP 74 61* 130* 106* 72    No results found for this or any previous visit (from the past 240 hour(s)).   Studies: Dg Chest Portable 1 View  02/22/2015   CLINICAL DATA:  54 year old female with chest pain and dizziness since this morning. Initial encounter.  EXAM: PORTABLE CHEST - 1 VIEW  COMPARISON:  01/17/2011 and earlier.  FINDINGS: Portable AP semi upright view at 1259 hrs. Large body habitus. Continued low lung volumes. Moderate elevation of  the right hemidiaphragm is stable. Stable cardiac size and mediastinal contours. Allowing for portable technique, the lungs are clear. No pneumothorax.  IMPRESSION: Low lung volumes, otherwise no acute cardiopulmonary abnormality.   Electronically Signed   By: Genevie Ann M.D.   On: 02/22/2015 13:42    Scheduled Meds: . aspirin EC  81 mg Oral Daily  . cefTRIAXone (ROCEPHIN)  IV  1 g Intravenous Q24H  . heparin  5,000 Units Subcutaneous 3 times per day  . insulin aspart  0-15 Units Subcutaneous Q4H  . insulin detemir  60 Units Subcutaneous BID  . prasugrel  10 mg Oral Daily   Continuous Infusions: . sodium chloride 150 mL/hr at 02/23/15 1855    Time spent: 35 minutes  [facility]   [email or web address], password [email or web address] 02/23/2015, 9:03 AM  LOS: 1 day

## 2015-02-23 NOTE — Hospital Discharge Follow-Up (Signed)
This Case Manager received phone call from Jacqlyn Krauss, RN CM regarding patient. Patient admitted with chest pain. Patient also has uncontrolled type 2 diabetes mellitus, coronary artery disease, hyperlipidemia, hypertension.  Met with patient at bedside; patient uninsured and indicates she has not seen her PCP since she lost her Medicaid coverage. Discussed the [place] and the services available at clinic, including Pharmacy (medication assistance programs discussed), Development worker, community (walk-in clinic hours discussed) resources. Patient indicates she does not have her own glucometer, and she has been using her mother-in-law's diabetes supplies. Patient would benefit from having her own diabetes supplies. If patient plans to obtain diabetes monitoring supplies at [facility], scripts needed at discharge for True Metrix meter, strips and lancets.  Patient indicates she would like to establish care at [facility].  Appointment obtained on 02/28/15 at [facility] with Dr. Jarold Song. Appointment time placed on AVS, and voicemail left for Jacqlyn Krauss, RN CM informing of appointment and patient's need for diabetes monitoring supplies.

## 2015-02-24 DIAGNOSIS — E1129 Type 2 diabetes mellitus with other diabetic kidney complication: Secondary | ICD-10-CM

## 2015-02-24 DIAGNOSIS — G43A0 Cyclical vomiting, not intractable: Secondary | ICD-10-CM

## 2015-02-24 DIAGNOSIS — E1165 Type 2 diabetes mellitus with hyperglycemia: Secondary | ICD-10-CM

## 2015-02-24 LAB — BASIC METABOLIC PANEL
Anion gap: 11 (ref 5–15)
BUN: 14 mg/dL (ref 6–20)
CHLORIDE: 104 mmol/L (ref 101–111)
CO2: 24 mmol/L (ref 22–32)
Calcium: 8.9 mg/dL (ref 8.9–10.3)
Creatinine, Ser: 0.73 mg/dL (ref 0.44–1.00)
GFR calc Af Amer: >60 mL/min (ref >60)
GFR calc non Af Amer: >60 mL/min (ref >60)
GLUCOSE: 105 mg/dL — AB (ref 65–99)
POTASSIUM: 3.8 mmol/L (ref 3.5–5.1)
Sodium: 139 mmol/L (ref 135–145)

## 2015-02-24 LAB — URINE CULTURE: SPECIAL REQUESTS: NORMAL

## 2015-02-24 LAB — GLUCOSE, CAPILLARY
GLUCOSE-CAPILLARY: 119 mg/dL — AB (ref 65–99)
GLUCOSE-CAPILLARY: 100 mg/dL — AB (ref 65–99)
GLUCOSE-CAPILLARY: 273 mg/dL — AB (ref 65–99)
Glucose-Capillary: 315 mg/dL — ABNORMAL HIGH (ref 65–99)

## 2015-02-24 LAB — CBC
HCT: 43.4 % (ref 36.0–46.0)
HEMOGLOBIN: 14.7 g/dL (ref 12.0–15.0)
MCH: 28.8 pg (ref 26.0–34.0)
MCHC: 33.9 g/dL (ref 30.0–36.0)
MCV: 85.1 fL (ref 78.0–100.0)
Platelets: 231 10*3/uL (ref 150–400)
RBC: 5.1 MIL/uL (ref 3.87–5.11)
RDW: 12.2 % (ref 11.5–15.5)
WBC: 6.9 10*3/uL (ref 4.0–10.5)

## 2015-02-24 MED ORDER — INSULIN PEN NEEDLE 29G X 10MM MISC: Status: DC

## 2015-02-24 MED ORDER — FREESTYLE SYSTEM KIT: PACK | Status: DC

## 2015-02-24 MED ORDER — LOVASTATIN 40 MG PO TABS: 40.0000 mg | ORAL_TABLET | Freq: Every day | ORAL | Status: DC

## 2015-02-24 MED ORDER — CIPROFLOXACIN HCL 500 MG PO TABS: 500.0000 mg | ORAL_TABLET | Freq: Two times a day (BID) | ORAL | Status: DC

## 2015-02-24 MED ORDER — INSULIN GLARGINE 300 UNIT/ML ~~LOC~~ SOPN: 50.0000 [IU] | PEN_INJECTOR | Freq: Two times a day (BID) | SUBCUTANEOUS | Status: DC

## 2015-02-24 MED ORDER — LOVASTATIN 40 MG PO TABS
40.0000 mg | ORAL_TABLET | Freq: Every day | ORAL | Status: DC
Start: 2015-02-24 — End: 2015-02-24

## 2015-02-24 MED ORDER — GLUCOSE BLOOD VI STRP: ORAL_STRIP | Status: DC

## 2015-02-24 MED ORDER — FREESTYLE LANCETS MISC: Status: DC

## 2015-02-24 MED ORDER — PRASUGREL HCL 10 MG PO TABS: 10.0000 mg | ORAL_TABLET | Freq: Every day | ORAL | Status: DC

## 2015-02-24 NOTE — Clinical Documentation Improvement (Signed)
Please clarify type and acuity of CHF: Possible Clinical Conditions?  Chronic Systolic Congestive Heart Failure Chronic Diastolic Congestive Heart Failure Chronic Systolic & Diastolic Congestive Heart Failure Acute Systolic Congestive Heart Failure Acute Diastolic Congestive Heart Failure Acute Systolic & Diastolic Congestive Heart Failure Acute on Chronic Systolic Congestive Heart Failure Acute on Chronic Diastolic Congestive Heart Failure Acute on Chronic Systolic & Diastolic Congestive Heart Failure Other Condition Cannot Clinically Determine  Supporting Information:(As per notes) "CHF"  Thank You, Alessandra Grout, RN, BSN, CCDS,Clinical Documentation Specialist:  [phone]  [phone]=Cell [place]- Health Information Management

## 2015-02-24 NOTE — Progress Notes (Signed)
Cardiology OP follow up arranged.   Brenda [name], PAC

## 2015-02-24 NOTE — Discharge Summary (Signed)
Physician Discharge Summary  Brenda [name] AOZ:[id number] DOB: [birth year] DOA: 02/22/2015  PCP: Harvie Junior, MD  Admit date: 02/22/2015 Discharge date: 02/24/2015  Time spent: greater than 30 minutes  Recommendations for Outpatient Follow-up:  1.   Discharge Diagnoses:  Principal Problem:   Chest pain Active Problems:   Sleep apnea   Morbid obesity   Vomiting   Leukocytosis   ARF (acute renal failure)   Hyperglycemia   DM type 2, uncontrolled, with renal complications   Hypotension   UTI (urinary tract infection)   Discharge Condition: stable  Diet recommendation: heart healthy carb modified  Filed Weights   02/22/15 1232 02/23/15 0400 02/24/15 0414  Weight: 123.945 kg (273 lb 4 oz) 126.236 kg (278 lb 4.8 oz) 125.601 kg (276 lb 14.4 oz)    History of present illness:  54 y.o. female past medical history of coronary artery disease status post stent to the LAD in 2010, diabetes mellitus on insulin, morbid obesity, obstructive sleep apnea and hypertension. The patient comes in with pain across her chest and in her jaw today. It started at 9:00 this morning and despite taking nitroglycerin at home, it did not resolve. It only resolved when she received morphine in the ER around 1:00. She states that she's been having pain across her shoulders on and off for the past few days now she also went into her jaw yesterday. She is also had episodes of vomiting that started about 3 days ago and has had severe nausea. She often has loose stools which she contributes to her metformin but feels that they have been worse than usual over the past few days. No complaints of heartburn. No fever chills or abdominal pain noted. Of note, she has not taken any of her cardiac medications in months due to lack of insurance. The only medications that she has been taking are Levemir which was given to her by a friend, aspirin, Novolin and metformin.  Hospital Course:   Chest pain: admitted to  telemetry.  MI ruled out.  Cardiology consulted and recommendedno further w/u per cardiology.  Resume ASA effient. Needs medication assistance and oupt f/u.   Sleep apnea: cpap  Morbid obesity  Vomiting likely viral gastroenteritis. Improved. No stool since admission.   ARF (acute renal failure): Secondary to prerenal azotemia and hypotension: improved with hydration  DM type 2, uncontrolled, with renal complications: Has had some relative lows on 60 of Levemir twice a day. changed to 50 twice a day.   Hypotension: Secondary to UTI, volume depletion from vomiting. Resolved by discharge   UTI (urinary tract infection): culture without significant growth.  Treated.  Procedures:  none  Consultations:  cardiology  Discharge Exam: Filed Vitals:   02/24/15 0830  BP: 126/59  Pulse: 74  Temp: 98.3 F (36.8 C)  Resp: 18    General:  A and o Cardiovascular: RRR Respiratory: CTA  Discharge Instructions   Discharge Instructions    Activity as tolerated - No restrictions    Complete by:  As directed      Diet - low sodium heart healthy    Complete by:  As directed      Diet Carb Modified    Complete by:  As directed           Current Discharge Medication List    START taking these medications   Details  ciprofloxacin (CIPRO) 500 MG tablet Take 1 tablet (500 mg total) by mouth 2 (two) times daily. Qty: 6 tablet, Refills:  0    glucose blood test strip Use as instructed Qty: 100 each, Refills: 0    glucose monitoring kit (FREESTYLE) monitoring kit As directed Qty: 1 each, Refills: 0    Insulin Glargine (TOUJEO SOLOSTAR) 300 UNIT/ML SOPN Inject 50 Units into the skin 2 (two) times daily. Qty: 1.5 mL, Refills: 0    Insulin Pen Needle 29G X 10MM MISC As directed Qty: 100 each, Refills: 0    Lancets (FREESTYLE) lancets Use as instructed Qty: 100 each, Refills: 0    lovastatin (MEVACOR) 40 MG tablet Take 1 tablet (40 mg total) by mouth at bedtime. Qty: 30  tablet, Refills: 0      CONTINUE these medications which have CHANGED   Details  prasugrel (EFFIENT) 10 MG TABS tablet Take 1 tablet (10 mg total) by mouth daily. Qty: 30 tablet, Refills: 0      CONTINUE these medications which have NOT CHANGED   Details  aspirin EC 81 MG tablet Take 81 mg by mouth daily.    CRANBERRY PO Take 2 tablets by mouth daily.      STOP taking these medications     insulin detemir (LEVEMIR FLEXPEN) 100 UNIT/ML injection      insulin NPH Human (HUMULIN N,NOVOLIN N) 100 UNIT/ML injection      metFORMIN (GLUCOPHAGE) 500 MG tablet      nitroGLYCERIN (NITROSTAT) 0.4 MG SL tablet      Phenazopyridine HCl (AZO TABS PO)      atorvastatin (LIPITOR) 80 MG tablet      furosemide (LASIX) 20 MG tablet      insulin lispro (HUMALOG) 100 UNIT/ML injection      isosorbide mononitrate (IMDUR) 30 MG 24 hr tablet      metoprolol tartrate (LOPRESSOR) 25 MG tablet      quinapril (ACCUPRIL) 40 MG tablet      sitaGLIPtin (JANUVIA) 50 MG tablet      spironolactone (ALDACTONE) 25 MG tablet        Allergies  Allergen Reactions  . Clopidogrel Bisulfate     REACTION: rash  . Hydrocodeine [Dihydrocodeine]    Follow-up Information    Follow up with [facility]     On 02/28/2015.   Why:  Appointment on 02/28/15 at 9:45am with Dr. Lisette Grinder information:   [address] [place] [address] [phone]      Follow up with Truitt Merle, NP On 03/16/2015.   Specialties:  Nurse Practitioner, Interventional Cardiology, Cardiology, Radiology   Why:  11:00 AM   Contact information:   [place]. [address] [place] [address] [phone]        The results of significant diagnostics from this hospitalization (including imaging, microbiology, ancillary and laboratory) are listed below for reference.    Significant Diagnostic Studies: Dg Chest Portable 1 View  02/22/2015   CLINICAL DATA:   54 year old female with chest pain and dizziness since this morning. Initial encounter.  EXAM: PORTABLE CHEST - 1 VIEW  COMPARISON:  01/17/2011 and earlier.  FINDINGS: Portable AP semi upright view at 1259 hrs. Large body habitus. Continued low lung volumes. Moderate elevation of the right hemidiaphragm is stable. Stable cardiac size and mediastinal contours. Allowing for portable technique, the lungs are clear. No pneumothorax.  IMPRESSION: Low lung volumes, otherwise no acute cardiopulmonary abnormality.   Electronically Signed   By: Genevie Ann M.D.   On: 02/22/2015 13:42    Microbiology: No results found for  this or any previous visit (from the past 240 hour(s)).   Labs: Basic Metabolic Panel:  Recent Labs Lab 02/22/15 1315 02/22/15 1542 02/23/15 0306 02/24/15 0424  NA 132*  --  137 139  K 4.7  --  4.3 3.8  CL 94*  --  100* 104  CO2 22  --  26 24  GLUCOSE 400*  --  66 105*  BUN 26*  --  30* 14  CREATININE 1.57* 1.93* 1.93* 0.73  CALCIUM 9.3  --  8.9 8.9   Liver Function Tests: No results for input(s): AST, ALT, ALKPHOS, BILITOT, PROT, ALBUMIN in the last 168 hours. No results for input(s): LIPASE, AMYLASE in the last 168 hours. No results for input(s): AMMONIA in the last 168 hours. CBC:  Recent Labs Lab 02/22/15 1315 02/23/15 0306 02/24/15 0424  WBC 17.3* 14.3* 6.9  HGB 16.1* 15.1* 14.7  HCT 46.0 44.8 43.4  MCV 84.7 85.2 85.1  PLT 280 317 231   Cardiac Enzymes:  Recent Labs Lab 02/22/15 2134 02/23/15 0306  TROPONINI <0.03 <0.03   BNP: BNP (last 3 results)  Recent Labs  02/22/15 1315  BNP 32.7    ProBNP (last 3 results) No results for input(s): PROBNP in the last 8760 hours.  CBG:  Recent Labs Lab 02/23/15 1704 02/23/15 2000 02/24/15 0018 02/24/15 0411 02/24/15 0723  GLUCAP 251* 386* 315* 119* 100*       Signed:  Lesleyann Fichter L  [facility] 02/24/2015, 10:42 AM

## 2015-02-28 ENCOUNTER — Encounter: Payer: Self-pay | Admitting: Family Medicine

## 2015-02-28 ENCOUNTER — Other Ambulatory Visit: Payer: Self-pay | Admitting: Pharmacist

## 2015-02-28 ENCOUNTER — Ambulatory Visit: Payer: Self-pay | Attending: Family Medicine | Admitting: Family Medicine

## 2015-02-28 VITALS — BP 134/85 | HR 107 | Temp 98.4°F | Resp 18 | Ht 62.0 in | Wt 277.6 lb

## 2015-02-28 DIAGNOSIS — E1165 Type 2 diabetes mellitus with hyperglycemia: Secondary | ICD-10-CM

## 2015-02-28 DIAGNOSIS — E1129 Type 2 diabetes mellitus with other diabetic kidney complication: Secondary | ICD-10-CM

## 2015-02-28 DIAGNOSIS — IMO0002 Reserved for concepts with insufficient information to code with codable children: Secondary | ICD-10-CM

## 2015-02-28 DIAGNOSIS — Z87891 Personal history of nicotine dependence: Secondary | ICD-10-CM | POA: Insufficient documentation

## 2015-02-28 DIAGNOSIS — I10 Essential (primary) hypertension: Secondary | ICD-10-CM

## 2015-02-28 DIAGNOSIS — Z794 Long term (current) use of insulin: Secondary | ICD-10-CM | POA: Insufficient documentation

## 2015-02-28 DIAGNOSIS — I25118 Atherosclerotic heart disease of native coronary artery with other forms of angina pectoris: Secondary | ICD-10-CM

## 2015-02-28 LAB — GLUCOSE, POCT (MANUAL RESULT ENTRY): POC Glucose: 139 mg/dl — AB (ref 70–99)

## 2015-02-28 MED ORDER — "INSULIN SYRINGE-NEEDLE U-100 31G X 15/64"" 1 ML MISC": Status: DC

## 2015-02-28 MED ORDER — INSULIN PEN NEEDLE 29G X 10MM MISC: Status: DC

## 2015-02-28 MED ORDER — TRUEPLUS LANCETS 30G MISC: 1.0000 | Freq: Three times a day (TID) | Status: DC

## 2015-02-28 MED ORDER — GLUCOSE BLOOD VI STRP: ORAL_STRIP | Status: DC

## 2015-02-28 MED ORDER — LOVASTATIN 40 MG PO TABS: 40.0000 mg | ORAL_TABLET | Freq: Every day | ORAL | Status: DC

## 2015-02-28 MED ORDER — INSULIN GLARGINE 300 UNIT/ML ~~LOC~~ SOPN: 50.0000 [IU] | PEN_INJECTOR | Freq: Two times a day (BID) | SUBCUTANEOUS | Status: DC

## 2015-02-28 MED ORDER — LISINOPRIL 5 MG PO TABS: 5.0000 mg | ORAL_TABLET | Freq: Every day | ORAL | Status: DC

## 2015-02-28 MED ORDER — ASPIRIN EC 81 MG PO TBEC: 81.0000 mg | DELAYED_RELEASE_TABLET | Freq: Every day | ORAL | Status: DC

## 2015-02-28 MED ORDER — PRASUGREL HCL 10 MG PO TABS: 10.0000 mg | ORAL_TABLET | Freq: Every day | ORAL | Status: DC

## 2015-02-28 MED ORDER — TRUE METRIX METER DEVI: 1.0000 | Freq: Three times a day (TID) | Status: DC

## 2015-02-28 NOTE — Progress Notes (Signed)
Quick Note:  Labs addressed at office as well as medications and patient was made aware. ______ 

## 2015-02-28 NOTE — Progress Notes (Signed)
Subjective:    Patient ID: Brenda [name], female    DOB: 1961-06-14, 54 y.o.   MRN: [id number]  HPI Admit date: 02/22/15 Discharge date: 02/24/59  Brenda [name] is a 54 year old female with a history of type 2 diabetes mellitus, obstructive sleep apnea, hypertension, CAD status post stent who presented to the ED with chest pain radiating to her jaw and had also had some vomiting and diarrhea. She had admitted being out of her cardiac medications due to lack of insurance. On presentation she was hypotensive with systolic blood pressures in the 90s, CBG was 400. She was placed on IV Zofran and IV fluids. Troponins were less than 0.03 x2, EKG revealed normal sinus rhythm at 78 bpm, right bundle branch block, possible lateral ischemia. She was seen by cardiology and chest pain was thought to be atypical and so medical management was recommended with dual antiplatelet therapy-aspirin and Effient.  UA was positive for UTI and so she was also placed on Rocephin. She was discharged after her condition improved with a follow-up appointment with cardiology on 03/16/2015.  Interval history: She reports doing well and has no complaints today.   Past Medical History  Diagnosis Date  . Obesity   . Hypertension   . CAD (coronary artery disease)     s/p nstemi 05/2009 treated w drug-eluting stents  . Hypercholesterolemia   . CHF (congestive heart failure)   . Heart attack 2010  . Sleep apnea     "suppose to wear mask; I don't" (02/22/2015)  . Diabetes mellitus     "started when I was pregnant; not sure if it was type 1 or type 2"   . Arthritis     "back, hands, hips" (02/22/2015)  . Depression     Past Surgical History  Procedure Laterality Date  . Left heart catheterization with coronary angiogram N/A 12/25/2012    Procedure: LEFT HEART CATHETERIZATION WITH CORONARY ANGIOGRAM;  Surgeon: Sherren Mocha, MD;  Location: [facility] CATH LAB;  Service: Cardiovascular;  Laterality: N/A;  . Cholecystectomy  open  1989  . Umbilical hernia repair  1990's  . Hernia repair    . Coronary angioplasty with stent placement  2011  . Cardiac catheterization  12/2012    Family History  Problem Relation Age of Onset  . Coronary artery disease Mother     in her mid 70s  . Heart attack Father     in his mid 25s  . COPD Father   . Hypertension Mother   . Cancer Mother   . Diabetes Sister   . Coronary artery disease Sister     valve replacement  . Stroke Sister     multiple    History   Social History  . Marital Status: Married    Spouse Name: N/A  . Number of Children: 2  . Years of Education: N/A   Occupational History  . Housewife    Social History Main Topics  . Smoking status: Former Smoker -- 0.50 packs/day for 1 years    Types: Cigarettes, Cigars    Quit date: 09/03/1986  . Smokeless tobacco: Never Used  . Alcohol Use: No  . Drug Use: No  . Sexual Activity: Not Currently   Other Topics Concern  . Not on file   Social History Narrative        Review of Systems  Constitutional: Negative for activity change, appetite change and fatigue.  HENT: Negative for congestion, sinus pressure and sore throat.   Eyes: Negative  for visual disturbance.  Respiratory: Negative for cough, chest tightness, shortness of breath and wheezing.   Cardiovascular: Negative for chest pain and palpitations.  Gastrointestinal: Negative for abdominal pain, constipation and abdominal distention.  Endocrine: Negative for polydipsia.  Genitourinary: Negative for dysuria and frequency.  Musculoskeletal: Negative for back pain and arthralgias.  Skin: Negative for rash.  Neurological: Negative for tremors, light-headedness and numbness.  Hematological: Does not bruise/bleed easily.  Psychiatric/Behavioral: Negative for behavioral problems and agitation.         Objective: Filed Vitals:   02/28/15 0934  BP: 134/85  Pulse: 114  Temp: 98.4 F (36.9 C)  TempSrc: Oral  Resp: 18  Height: 5'  2" (1.575 m)  Weight: 277 lb 9.6 oz (125.919 kg)  SpO2: 93%      Physical Exam  Constitutional: She is oriented to person, place, and time. She appears well-developed and well-nourished. No distress.  HENT:  Head: Normocephalic.  Right Ear: External ear normal.  Left Ear: External ear normal.  Nose: Nose normal.  Mouth/Throat: Oropharynx is clear and moist.  Eyes: Conjunctivae and EOM are normal. Pupils are equal, round, and reactive to light.  Neck: Normal range of motion. No JVD present.  Cardiovascular: Regular rhythm, normal heart sounds and intact distal pulses.  Tachycardia present.  Exam reveals no gallop.   No murmur heard. Pulmonary/Chest: Effort normal and breath sounds normal. No respiratory distress. She has no wheezes. She has no rales. She exhibits no tenderness.  Abdominal: Soft. Bowel sounds are normal. She exhibits no distension and no mass. There is no tenderness.  Musculoskeletal: Normal range of motion. She exhibits no edema or tenderness.  Neurological: She is alert and oriented to person, place, and time. She has normal reflexes.  Skin: Skin is warm and dry. She is not diaphoretic.  Psychiatric: She has a normal mood and affect.            Assessment & Plan:  54 year old female patient with a history of type 2 diabetes mellitus, hypertension, coronary artery disease status post stent who has been noncompliant with medications prior to now due to financial constraints with a recent hospitalization for atypical chest pain.  Type 2 diabetes mellitus: Uncontrolled with A1c of 13.5. CBG is 139 which is close to normal and could be explained by the fact that she took 30 units of NovoLog this morning. I have advised her to hold off on NovoLog and I will resume her Toujeo 50 units twice daily and review her blood sugar log at next visit after which we will determine if she needs to go back on a low-dose of NovoLog.  Hypertension: Previously on antihypertensives  which was discontinued during hospitalization. Placed on low-dose lisinopril for renal protection.  CAD status post stent: Continue until antiplatelet therapy with ASA and Effient. Oxygen saturation is on the low side but she is asymptomatic. Advised to keep appointment with cardiology.  Medication sent to pharmacy on site and I have explained to the patient that the pharmacy would work with her regards up in her medicines.

## 2015-02-28 NOTE — Patient Instructions (Signed)
Diabetes Mellitus and Food It is important for you to manage your blood sugar (glucose) level. Your blood glucose level can be greatly affected by what you eat. Eating healthier foods in the appropriate amounts throughout the day at about the same time each day will help you control your blood glucose level. It can also help slow or prevent worsening of your diabetes mellitus. Healthy eating may even help you improve the level of your blood pressure and reach or maintain a healthy weight.  HOW CAN FOOD AFFECT ME? Carbohydrates Carbohydrates affect your blood glucose level more than any other type of food. Your dietitian will help you determine how many carbohydrates to eat at each meal and teach you how to count carbohydrates. Counting carbohydrates is important to keep your blood glucose at a healthy level, especially if you are using insulin or taking certain medicines for diabetes mellitus. Alcohol Alcohol can cause sudden decreases in blood glucose (hypoglycemia), especially if you use insulin or take certain medicines for diabetes mellitus. Hypoglycemia can be a life-threatening condition. Symptoms of hypoglycemia (sleepiness, dizziness, and disorientation) are similar to symptoms of having too much alcohol.  If your health care provider has given you approval to drink alcohol, do so in moderation and use the following guidelines:  Women should not have more than one drink per day, and men should not have more than two drinks per day. One drink is equal to:  12 oz of beer.  5 oz of wine.  1 oz of hard liquor.  Do not drink on an empty stomach.  Keep yourself hydrated. Have water, diet soda, or unsweetened iced tea.  Regular soda, juice, and other mixers might contain a lot of carbohydrates and should be counted. WHAT FOODS ARE NOT RECOMMENDED? As you make food choices, it is important to remember that all foods are not the same. Some foods have fewer nutrients per serving than other  foods, even though they might have the same number of calories or carbohydrates. It is difficult to get your body what it needs when you eat foods with fewer nutrients. Examples of foods that you should avoid that are high in calories and carbohydrates but low in nutrients include:  Trans fats (most processed foods list trans fats on the Nutrition Facts label).  Regular soda.  Juice.  Candy.  Sweets, such as cake, pie, doughnuts, and cookies.  Fried foods. WHAT FOODS CAN I EAT? Have nutrient-rich foods, which will nourish your body and keep you healthy. The food you should eat also will depend on several factors, including:  The calories you need.  The medicines you take.  Your weight.  Your blood glucose level.  Your blood pressure level.  Your cholesterol level. You also should eat a variety of foods, including:  Protein, such as meat, poultry, fish, tofu, nuts, and seeds (lean animal proteins are best).  Fruits.  Vegetables.  Dairy products, such as milk, cheese, and yogurt (low fat is best).  Breads, grains, pasta, cereal, rice, and beans.  Fats such as olive oil, trans fat-free margarine, canola oil, avocado, and olives. DOES EVERYONE WITH DIABETES MELLITUS HAVE THE SAME MEAL PLAN? Because every person with diabetes mellitus is different, there is not one meal plan that works for everyone. It is very important that you meet with a dietitian who will help you create a meal plan that is just right for you. Document Released: 05/17/2005 Document Revised: 08/25/2013 Document Reviewed: 07/17/2013 [employer] Patient Information 2015 [employer]. This   information is not intended to replace advice given to you by your health care provider. Make sure you discuss any questions you have with your health care provider.  

## 2015-02-28 NOTE — Progress Notes (Signed)
Patient here for HFU for chest pains. Patient would like to establish care with a PCP. Patient denies any pain today. Patient denies any chest pains. Patient reports she has been feeling fine since she has been out of the hospital.  Patient was given prescriptions for insulin pin needle, lovastatin, effient, and lancets at the hospital but patient has not been able to get them filled due to finances.   Patient uses mother in law glucose monitor but states that she could use one of her own.  Patient has lots of anxiety today about appointment. Patient started crying when taken to the lab. Patient expressed that she has lots of anxiety when she comes to the doctor and she was concerned as to how she would pay for her appointment.

## 2015-03-11 ENCOUNTER — Encounter: Payer: Self-pay | Admitting: Internal Medicine

## 2015-03-14 ENCOUNTER — Encounter: Payer: Self-pay | Admitting: Family Medicine

## 2015-03-14 ENCOUNTER — Ambulatory Visit: Payer: Medicaid Other | Attending: Family Medicine

## 2015-03-14 ENCOUNTER — Ambulatory Visit: Payer: Self-pay | Attending: Family Medicine | Admitting: Family Medicine

## 2015-03-14 VITALS — BP 109/75 | HR 92 | Temp 98.1°F | Resp 18 | Ht 62.0 in | Wt 280.8 lb

## 2015-03-14 DIAGNOSIS — I25118 Atherosclerotic heart disease of native coronary artery with other forms of angina pectoris: Secondary | ICD-10-CM

## 2015-03-14 DIAGNOSIS — I10 Essential (primary) hypertension: Secondary | ICD-10-CM

## 2015-03-14 DIAGNOSIS — E1165 Type 2 diabetes mellitus with hyperglycemia: Secondary | ICD-10-CM

## 2015-03-14 DIAGNOSIS — E669 Obesity, unspecified: Secondary | ICD-10-CM

## 2015-03-14 LAB — GLUCOSE, POCT (MANUAL RESULT ENTRY): POC Glucose: 116 mg/dl — AB (ref 70–99)

## 2015-03-14 MED ORDER — INSULIN LISPRO 100 UNIT/ML ~~LOC~~ SOLN: 0.0000 [IU] | Freq: Three times a day (TID) | SUBCUTANEOUS | Status: DC

## 2015-03-14 MED ORDER — INSULIN GLARGINE 100 UNIT/ML SOLOSTAR PEN: 65.0000 [IU] | PEN_INJECTOR | Freq: Every day | SUBCUTANEOUS | Status: DC

## 2015-03-14 MED ORDER — INSULIN ASPART 100 UNIT/ML ~~LOC~~ SOLN: 0.0000 [IU] | Freq: Three times a day (TID) | SUBCUTANEOUS | Status: DC

## 2015-03-14 NOTE — Patient Instructions (Signed)
Diabetes Mellitus and Food It is important for you to manage your blood sugar (glucose) level. Your blood glucose level can be greatly affected by what you eat. Eating healthier foods in the appropriate amounts throughout the day at about the same time each day will help you control your blood glucose level. It can also help slow or prevent worsening of your diabetes mellitus. Healthy eating may even help you improve the level of your blood pressure and reach or maintain a healthy weight.  HOW CAN FOOD AFFECT ME? Carbohydrates Carbohydrates affect your blood glucose level more than any other type of food. Your dietitian will help you determine how many carbohydrates to eat at each meal and teach you how to count carbohydrates. Counting carbohydrates is important to keep your blood glucose at a healthy level, especially if you are using insulin or taking certain medicines for diabetes mellitus. Alcohol Alcohol can cause sudden decreases in blood glucose (hypoglycemia), especially if you use insulin or take certain medicines for diabetes mellitus. Hypoglycemia can be a life-threatening condition. Symptoms of hypoglycemia (sleepiness, dizziness, and disorientation) are similar to symptoms of having too much alcohol.  If your health care provider has given you approval to drink alcohol, do so in moderation and use the following guidelines:  Women should not have more than one drink per day, and men should not have more than two drinks per day. One drink is equal to:  12 oz of beer.  5 oz of wine.  1 oz of hard liquor.  Do not drink on an empty stomach.  Keep yourself hydrated. Have water, diet soda, or unsweetened iced tea.  Regular soda, juice, and other mixers might contain a lot of carbohydrates and should be counted. WHAT FOODS ARE NOT RECOMMENDED? As you make food choices, it is important to remember that all foods are not the same. Some foods have fewer nutrients per serving than other  foods, even though they might have the same number of calories or carbohydrates. It is difficult to get your body what it needs when you eat foods with fewer nutrients. Examples of foods that you should avoid that are high in calories and carbohydrates but low in nutrients include:  Trans fats (most processed foods list trans fats on the Nutrition Facts label).  Regular soda.  Juice.  Candy.  Sweets, such as cake, pie, doughnuts, and cookies.  Fried foods. WHAT FOODS CAN I EAT? Have nutrient-rich foods, which will nourish your body and keep you healthy. The food you should eat also will depend on several factors, including:  The calories you need.  The medicines you take.  Your weight.  Your blood glucose level.  Your blood pressure level.  Your cholesterol level. You also should eat a variety of foods, including:  Protein, such as meat, poultry, fish, tofu, nuts, and seeds (lean animal proteins are best).  Fruits.  Vegetables.  Dairy products, such as milk, cheese, and yogurt (low fat is best).  Breads, grains, pasta, cereal, rice, and beans.  Fats such as olive oil, trans fat-free margarine, canola oil, avocado, and olives. DOES EVERYONE WITH DIABETES MELLITUS HAVE THE SAME MEAL PLAN? Because every person with diabetes mellitus is different, there is not one meal plan that works for everyone. It is very important that you meet with a dietitian who will help you create a meal plan that is just right for you. Document Released: 05/17/2005 Document Revised: 08/25/2013 Document Reviewed: 07/17/2013 [employer] Patient Information 2015 [employer]. This   information is not intended to replace advice given to you by your health care provider. Make sure you discuss any questions you have with your health care provider.  

## 2015-03-14 NOTE — Progress Notes (Signed)
Subjective:    Patient ID: Brenda [name], female    DOB: 14-Sep-1960, 54 y.o.   MRN: [id number]  HPI  Brenda [name] is a 54 year old female with a history of type 2 diabetes mellitus, obstructive sleep apnea, hypertension, CAD status post stent to LAD in 2010 and recent hospitalization from 6/21- 02/24/15 for atypical chest pain which was thought to be secondary to noncompliance with medications and she was advised to remain on dual antiplatelet therapy.  She was scheduled to see cardiology on 03/16/15 but her appointment was canceled because she had could not afford the visit. She remains compliant with aspirin and Effient and reports no chest pain, shortness of breath, paroxysmal nocturnal dyspnea at this time.  For her diabetes she remains on Lantus 50 units twice daily and had to commence NovoLog 30 units 3 times daily with meals because her blood sugars remained elevated. Have reviewed her blood sugar log which reveals fasting sugars in the 350s and random sugars in the 300s prior to initiation of NovoLog which subsequently improved to fasting of 125 and random of 192 after she got NovoLog over-the-counter.  She has no additional concerns at this time.   Past Medical History  Diagnosis Date  . Obesity   . Hypertension   . CAD (coronary artery disease)     s/p nstemi 05/2009 treated w drug-eluting stents  . Hypercholesterolemia   . CHF (congestive heart failure)   . Heart attack 2010  . Sleep apnea     "suppose to wear mask; I don't" (02/22/2015)  . Diabetes mellitus     "started when I was pregnant; not sure if it was type 1 or type 2"   . Arthritis     "back, hands, hips" (02/22/2015)  . Depression     Past Surgical History  Procedure Laterality Date  . Left heart catheterization with coronary angiogram N/A 12/25/2012    Procedure: LEFT HEART CATHETERIZATION WITH CORONARY ANGIOGRAM;  Surgeon: Sherren Mocha, MD;  Location: [facility] CATH LAB;  Service: Cardiovascular;  Laterality: N/A;    . Cholecystectomy open  1989  . Umbilical hernia repair  1990's  . Hernia repair    . Coronary angioplasty with stent placement  2011  . Cardiac catheterization  12/2012    Family History  Problem Relation Age of Onset  . Coronary artery disease Mother     in her mid 103s  . Heart attack Father     in his mid 36s  . COPD Father   . Hypertension Mother   . Cancer Mother   . Diabetes Sister   . Coronary artery disease Sister     valve replacement  . Stroke Sister     multiple    History   Social History  . Marital Status: Married    Spouse Name: N/A  . Number of Children: 2  . Years of Education: N/A   Occupational History  . Housewife    Social History Main Topics  . Smoking status: Former Smoker -- 0.50 packs/day for 1 years    Types: Cigarettes, Cigars    Quit date: 09/03/1986  . Smokeless tobacco: Never Used  . Alcohol Use: No  . Drug Use: No  . Sexual Activity: Not Currently   Other Topics Concern  . Not on file   Social History Narrative    Allergies  Allergen Reactions  . Clopidogrel Bisulfate     REACTION: rash  . Hydrocodeine [Dihydrocodeine]     Current Outpatient Prescriptions  on File Prior to Visit  Medication Sig Dispense Refill  . aspirin EC 81 MG tablet Take 1 tablet (81 mg total) by mouth daily. 30 tablet 3  . Blood Glucose Monitoring Suppl (TRUE METRIX METER) DEVI 1 each by Does not apply route 3 (three) times daily. 1 Device 0  . CRANBERRY PO Take 2 tablets by mouth daily.    Marland Kitchen glucose blood (TRUE METRIX BLOOD GLUCOSE TEST) test strip Use as instructed 100 each 5  . Insulin Syringe-Needle U-100 (BD INSULIN SYRINGE ULTRAFINE) 31G X 15/64" 1 ML MISC As directed 100 each 0  . lisinopril (PRINIVIL,ZESTRIL) 5 MG tablet Take 1 tablet (5 mg total) by mouth daily. 90 tablet 3  . lovastatin (MEVACOR) 40 MG tablet Take 1 tablet (40 mg total) by mouth at bedtime. 30 tablet 3  . prasugrel (EFFIENT) 10 MG TABS tablet Take 1 tablet (10 mg total) by  mouth daily. 30 tablet 3  . TRUEPLUS LANCETS 30G MISC 1 each by Does not apply route 3 (three) times daily. 100 each 5  . ciprofloxacin (CIPRO) 500 MG tablet Take 1 tablet (500 mg total) by mouth 2 (two) times daily. (Patient not taking: Reported on 03/14/2015) 6 tablet 0   No current facility-administered medications on file prior to visit.        Review of Systems  Constitutional: Negative for activity change, appetite change and fatigue.  HENT: Negative for congestion, sinus pressure and sore throat.   Eyes: Negative for visual disturbance.  Respiratory: Negative for cough, chest tightness, shortness of breath and wheezing.   Cardiovascular: Negative for chest pain and palpitations.  Gastrointestinal: Negative for abdominal pain, constipation and abdominal distention.  Endocrine: Negative for polydipsia.  Genitourinary: Negative for dysuria and frequency.  Musculoskeletal: Negative for back pain and arthralgias.  Skin: Negative for rash.  Neurological: Negative for tremors, light-headedness and numbness.  Hematological: Does not bruise/bleed easily.  Psychiatric/Behavioral: Negative for behavioral problems and agitation.         Objective: Filed Vitals:   03/14/15 1444  BP: 109/75  Pulse: 92  Temp: 98.1 F (36.7 C)  TempSrc: Oral  Resp: 18  Height: 5\' 2"  (1.575 m)  Weight: 280 lb 12.8 oz (127.37 kg)  SpO2: 96%      Physical Exam  Constitutional: She is oriented to person, place, and time. She appears well-developed and well-nourished.  obese  Eyes: Conjunctivae and EOM are normal. Pupils are equal, round, and reactive to light.  Neck: Normal range of motion. No JVD present.  Cardiovascular: Normal rate, regular rhythm, normal heart sounds and intact distal pulses.  Exam reveals no gallop.   No murmur heard. Pulmonary/Chest: Effort normal and breath sounds normal. No respiratory distress. She has no wheezes. She has no rales. She exhibits no tenderness.  Abdominal:  Soft. Bowel sounds are normal. She exhibits no distension and no mass. There is no tenderness.  Musculoskeletal: Normal range of motion. She exhibits no edema or tenderness.  Neurological: She is alert and oriented to person, place, and time. She has normal reflexes.  Skin: Skin is warm and dry.  Psychiatric: She has a normal mood and affect.     Lab Results  Component Value Date   HGBA1C 13.5* 02/22/2015          Assessment & Plan:  54 year old female patient with a history of type 2 diabetes mellitus, hypertension, coronary artery disease status post stent who has been noncompliant with medications prior to now due to financial  constraints here for a follow up of her Diabetes Mellitus.   Type 2 diabetes mellitus: Uncontrolled with A1c of 13.5. CBG is 116 Increase Lantus to 65 units twice daily and she has been placed on Novolog sliding scale as her sugars prior to initiation of Novolog were in the 300s.    Hypertension: Controlled on Lisinopril.   CAD status post stent: Continue dual antiplatelet therapy with ASA and Effient. Unable to see Cardiology due to lack of insurance; will place on Dr Winnifred Friar schedule.  Obesity: Advised on weight loss and reducing portion sizes.  This note has been created with Surveyor, quantity. Any transcriptional errors are unintentional.

## 2015-03-14 NOTE — Progress Notes (Signed)
Patient is here for follow up for DM type 2. CBG is 116. Patient reports she feels fine today. Patient denies any pain today.  This weekend patient reports she purchased regular insulin and has been taking that before each meal along with taking the lantus. Patient reports she has been doing this because her blood glucose has been high since she last came in two weeks ago and she felt really bad.

## 2015-03-15 NOTE — Progress Notes (Signed)
Quick Note:  Labs addressed at office as well as medications and patient was made aware. ______ 

## 2015-03-16 ENCOUNTER — Ambulatory Visit: Payer: Medicaid Other | Admitting: Nurse Practitioner

## 2015-03-23 ENCOUNTER — Ambulatory Visit: Payer: Self-pay | Attending: Cardiology | Admitting: Cardiology

## 2015-03-23 ENCOUNTER — Encounter: Payer: Self-pay | Admitting: Cardiology

## 2015-03-23 VITALS — BP 146/76 | HR 109 | Temp 98.7°F | Resp 20 | Ht 62.0 in | Wt 277.6 lb

## 2015-03-23 DIAGNOSIS — Z87891 Personal history of nicotine dependence: Secondary | ICD-10-CM | POA: Insufficient documentation

## 2015-03-23 DIAGNOSIS — I2510 Atherosclerotic heart disease of native coronary artery without angina pectoris: Secondary | ICD-10-CM | POA: Insufficient documentation

## 2015-03-23 MED ORDER — METOPROLOL SUCCINATE ER 25 MG PO TB24: 25.0000 mg | ORAL_TABLET | Freq: Every day | ORAL | Status: DC

## 2015-03-23 MED ORDER — NITROGLYCERIN 0.4 MG SL SUBL: 0.4000 mg | SUBLINGUAL_TABLET | SUBLINGUAL | Status: DC | PRN

## 2015-03-23 NOTE — Progress Notes (Signed)
Patient was going to [facility] but lost insurance last year. Patient hospitalized for dehydration. Patient feels well. Patient denies pain at this time.

## 2015-03-23 NOTE — Patient Instructions (Addendum)
Thank you for coming in today. We are adding metoprolol succinate to your medications. You can take your medications at night.  Please come back to see Dr. Verl Blalock in 1 year.

## 2015-03-23 NOTE — Assessment & Plan Note (Addendum)
She is currently stable without angina. We'll continue secondary preventative strategy including adding a beta blocker for elevated heart rate and hypertension, continuing statin, ACE inhibitor, and dual antiplatelets therapy. I've also given her a prescription for sublingual nitroglycerin. I will see her back in one year or sooner as necessary.

## 2015-03-23 NOTE — Progress Notes (Signed)
HPI Brenda [name] is a very pleasant 54 year old white female who comes today to establish with me as her cardiologist. She has been a former patient of Dr. Burt Knack but his lost her insurance.  She establish with primary care here at the [facility] last week. She has gotten back on her dual antiplatelets therapy.  She's having no angina or chest pain. She's had previous complex stenting of an LAD and diagonal branch in 2010. At that time showed a non-Q wave myocardial infarction. She had recurrent chest pain and had a heart catheterization a couple years ago. It showed some complex restenosis of the LAD and diagonal but this is been treated medically. She has done well.  Her risk factors include obesity, hypertension, known CAD, hyperlipidemia, sleep apnea, diabetes with poor control.  She is compliant with her medications.  Past Medical History  Diagnosis Date  . Obesity   . Hypertension   . CAD (coronary artery disease)     s/p nstemi 05/2009 treated w drug-eluting stents  . Hypercholesterolemia   . CHF (congestive heart failure)   . Heart attack 2010  . Sleep apnea     "suppose to wear mask; I don't" (02/22/2015)  . Diabetes mellitus     "started when I was pregnant; not sure if it was type 1 or type 2"   . Arthritis     "back, hands, hips" (02/22/2015)  . Depression     Current Outpatient Prescriptions  Medication Sig Dispense Refill  . aspirin EC 81 MG tablet Take 1 tablet (81 mg total) by mouth daily. 30 tablet 3  . Blood Glucose Monitoring Suppl (TRUE METRIX METER) DEVI 1 each by Does not apply route 3 (three) times daily. 1 Device 0  . CRANBERRY PO Take 2 tablets by mouth daily.    Marland Kitchen glucose blood (TRUE METRIX BLOOD GLUCOSE TEST) test strip Use as instructed 100 each 5  . insulin aspart (NOVOLOG) 100 UNIT/ML injection Inject 0-12 Units into the skin 3 (three) times daily before meals. According to sliding scale. 3 vial 5  . Insulin Glargine (LANTUS SOLOSTAR) 100 UNIT/ML  Solostar Pen Inject 65 Units into the skin daily at 10 pm. 5 pen 5  . Insulin Syringe-Needle U-100 (BD INSULIN SYRINGE ULTRAFINE) 31G X 15/64" 1 ML MISC As directed 100 each 0  . lisinopril (PRINIVIL,ZESTRIL) 5 MG tablet Take 1 tablet (5 mg total) by mouth daily. 90 tablet 3  . lovastatin (MEVACOR) 40 MG tablet Take 1 tablet (40 mg total) by mouth at bedtime. 30 tablet 3  . prasugrel (EFFIENT) 10 MG TABS tablet Take 1 tablet (10 mg total) by mouth daily. 30 tablet 3  . TRUEPLUS LANCETS 30G MISC 1 each by Does not apply route 3 (three) times daily. 100 each 5  . ciprofloxacin (CIPRO) 500 MG tablet Take 1 tablet (500 mg total) by mouth 2 (two) times daily. (Patient not taking: Reported on 03/14/2015) 6 tablet 0   No current facility-administered medications for this visit.    Allergies  Allergen Reactions  . Clopidogrel Bisulfate     REACTION: rash  . Hydrocodeine [Dihydrocodeine]     Family History  Problem Relation Age of Onset  . Coronary artery disease Mother     in her mid 70s  . Heart attack Father     in his mid 46s  . COPD Father   . Hypertension Mother   . Cancer Mother   . Diabetes Sister   . Coronary artery disease  Sister     valve replacement  . Stroke Sister     multiple    History   Social History  . Marital Status: Married    Spouse Name: N/A  . Number of Children: 2  . Years of Education: N/A   Occupational History  . Housewife    Social History Main Topics  . Smoking status: Former Smoker -- 0.50 packs/day for 1 years    Types: Cigarettes, Cigars    Quit date: 09/03/1986  . Smokeless tobacco: Never Used  . Alcohol Use: No  . Drug Use: No  . Sexual Activity: Not Currently   Other Topics Concern  . Not on file   Social History Narrative    ROS ALL NEGATIVE EXCEPT THOSE NOTED IN HPI  PE  General Appearance: well developed, well nourished in no acute distress, morbidly obese HEENT: symmetrical face, PERRLA, good dentition  Neck: no JVD,  thyromegaly, or adenopathy, trachea midline Chest: symmetric without deformity Cardiac: PMI poorly appreciated, RRR, normal S1, S2, no gallop or murmur Lung: clear to ausculation and percussion Vascular: Dorsalis pedis posterior tibial 1+ bilaterally Extremities: no cyanosis, clubbing or edema, no sign of DVT, no varicosities  Skin: normal color, no rashes Neuro: alert and oriented x 3, non-focal Pysch: normal affect, anxious  EKG  BMET    Component Value Date/Time   NA 139 02/24/2015 0424   K 3.8 02/24/2015 0424   CL 104 02/24/2015 0424   CO2 24 02/24/2015 0424   GLUCOSE 105* 02/24/2015 0424   BUN 14 02/24/2015 0424   CREATININE 0.73 02/24/2015 0424   CALCIUM 8.9 02/24/2015 0424   GFRNONAA >60 02/24/2015 0424   GFRAA >60 02/24/2015 0424    Lipid Panel     Component Value Date/Time   CHOL 157 10/25/2011 0954   TRIG 126.0 10/25/2011 0954   HDL 47.70 10/25/2011 0954   CHOLHDL 3 10/25/2011 0954   VLDL 25.2 10/25/2011 0954   LDLCALC 84 10/25/2011 0954    CBC    Component Value Date/Time   WBC 6.9 02/24/2015 0424   RBC 5.10 02/24/2015 0424   HGB 14.7 02/24/2015 0424   HCT 43.4 02/24/2015 0424   PLT 231 02/24/2015 0424   MCV 85.1 02/24/2015 0424   MCH 28.8 02/24/2015 0424   MCHC 33.9 02/24/2015 0424   RDW 12.2 02/24/2015 0424   LYMPHSABS 2.7 12/16/2012 1139   MONOABS 0.5 12/16/2012 1139   EOSABS 0.2 12/16/2012 1139   BASOSABS 0.0 12/16/2012 1139

## 2015-03-28 ENCOUNTER — Ambulatory Visit: Payer: Medicaid Other | Admitting: Family Medicine

## 2015-03-28 ENCOUNTER — Ambulatory Visit: Payer: Medicaid Other

## 2015-03-30 ENCOUNTER — Other Ambulatory Visit: Payer: Self-pay | Admitting: Internal Medicine

## 2015-03-30 MED ORDER — INSULIN ASPART 100 UNIT/ML ~~LOC~~ SOLN: 0.0000 [IU] | Freq: Three times a day (TID) | SUBCUTANEOUS | Status: DC

## 2015-03-30 MED ORDER — INSULIN GLARGINE 100 UNIT/ML SOLOSTAR PEN: 65.0000 [IU] | PEN_INJECTOR | Freq: Every day | SUBCUTANEOUS | Status: DC

## 2015-03-30 MED ORDER — PRASUGREL HCL 10 MG PO TABS: 10.0000 mg | ORAL_TABLET | Freq: Every day | ORAL | Status: DC

## 2015-04-05 ENCOUNTER — Ambulatory Visit: Payer: Self-pay | Attending: Family Medicine | Admitting: Family Medicine

## 2015-04-05 ENCOUNTER — Encounter: Payer: Self-pay | Admitting: Family Medicine

## 2015-04-05 VITALS — BP 160/80 | HR 79 | Temp 97.9°F | Ht 62.0 in | Wt 279.0 lb

## 2015-04-05 DIAGNOSIS — Z87891 Personal history of nicotine dependence: Secondary | ICD-10-CM | POA: Insufficient documentation

## 2015-04-05 DIAGNOSIS — E1129 Type 2 diabetes mellitus with other diabetic kidney complication: Secondary | ICD-10-CM

## 2015-04-05 DIAGNOSIS — Z794 Long term (current) use of insulin: Secondary | ICD-10-CM | POA: Insufficient documentation

## 2015-04-05 DIAGNOSIS — E785 Hyperlipidemia, unspecified: Secondary | ICD-10-CM

## 2015-04-05 DIAGNOSIS — Z6841 Body Mass Index (BMI) 40.0 and over, adult: Secondary | ICD-10-CM | POA: Insufficient documentation

## 2015-04-05 DIAGNOSIS — I10 Essential (primary) hypertension: Secondary | ICD-10-CM

## 2015-04-05 DIAGNOSIS — I2510 Atherosclerotic heart disease of native coronary artery without angina pectoris: Secondary | ICD-10-CM

## 2015-04-05 DIAGNOSIS — H6123 Impacted cerumen, bilateral: Secondary | ICD-10-CM

## 2015-04-05 LAB — GLUCOSE, POCT (MANUAL RESULT ENTRY): POC Glucose: 144 mg/dl — AB (ref 70–99)

## 2015-04-05 MED ORDER — INSULIN GLARGINE 100 UNIT/ML SOLOSTAR PEN: 70.0000 [IU] | PEN_INJECTOR | Freq: Every day | SUBCUTANEOUS | Status: DC

## 2015-04-05 MED ORDER — INSULIN GLARGINE 100 UNIT/ML SOLOSTAR PEN: 70.0000 [IU] | PEN_INJECTOR | Freq: Two times a day (BID) | SUBCUTANEOUS | Status: DC

## 2015-04-05 NOTE — Progress Notes (Signed)
Subjective:    Patient ID: Brenda [name], female    DOB: [birth year], 54 y.o.   MRN: [id number]  HPI  Brenda [name] is a 54 year old female with a history of type 2 diabetes mellitus (A1c 13.5), obstructive sleep apnea, hypertension, CAD status post stent to LAD in 2010 and recent hospitalization from 6/21- 02/24/15 for atypical chest pain which was thought to be secondary to noncompliance with medications and she was advised to remain on dual antiplatelet therapy. She was unable to see [facility] due to lack of insurance and so got to see Dr. Verl Blalock; Toprol-XL was added to her regimen and she is stable at this time and denies any chest pains or shortness of breath and has been compliant with medications.  I had increased the dose of her Lantus at her last office visit due to persisting hyperglycemia and she tells me today that her random blood sugars run between 200-300 and fasting sugars between 150-180; she also has not been compliant with ADA diet and exercise. She admits to snacking late into the night. She complains of her eosinophilia in clogs more so on the left side but denies any form of pain or drainage.  Her blood pressure is elevated today which she attributes to recently being under a lot of stress.    Past Medical History  Diagnosis Date  . Obesity   . Hypertension   . CAD (coronary artery disease)     s/p nstemi 05/2009 treated w drug-eluting stents  . Hypercholesterolemia   . CHF (congestive heart failure)   . Heart attack 2010  . Sleep apnea     "suppose to wear mask; I don't" (02/22/2015)  . Diabetes mellitus     "started when I was pregnant; not sure if it was type 1 or type 2"   . Arthritis     "back, hands, hips" (02/22/2015)  . Depression     Past Surgical History  Procedure Laterality Date  . Left heart catheterization with coronary angiogram N/A 12/25/2012    Procedure: LEFT HEART CATHETERIZATION WITH CORONARY ANGIOGRAM;  Surgeon: Sherren Mocha, MD;   Location: [facility] CATH LAB;  Service: Cardiovascular;  Laterality: N/A;  . Cholecystectomy open  1989  . Umbilical hernia repair  1990's  . Hernia repair    . Coronary angioplasty with stent placement  2011  . Cardiac catheterization  12/2012    History   Social History  . Marital Status: Married    Spouse Name: N/A  . Number of Children: 2  . Years of Education: N/A   Occupational History  . Housewife    Social History Main Topics  . Smoking status: Former Smoker -- 0.50 packs/day for 1 years    Types: Cigarettes, Cigars    Quit date: 09/03/1986  . Smokeless tobacco: Never Used  . Alcohol Use: No  . Drug Use: No  . Sexual Activity: Not Currently   Other Topics Concern  . Not on file   Social History Narrative    Allergies  Allergen Reactions  . Clopidogrel Bisulfate     REACTION: rash  . Hydrocodeine [Dihydrocodeine]     Current Outpatient Prescriptions on File Prior to Visit  Medication Sig Dispense Refill  . aspirin EC 81 MG tablet Take 1 tablet (81 mg total) by mouth daily. 30 tablet 3  . Blood Glucose Monitoring Suppl (TRUE METRIX METER) DEVI 1 each by Does not apply route 3 (three) times daily. 1 Device 0  . CRANBERRY PO  Take 2 tablets by mouth daily.    Marland Kitchen glucose blood (TRUE METRIX BLOOD GLUCOSE TEST) test strip Use as instructed 100 each 5  . insulin aspart (NOVOLOG) 100 UNIT/ML injection Inject 0-12 Units into the skin 3 (three) times daily before meals. According to sliding scale. 40 mL 3  . Insulin Syringe-Needle U-100 (BD INSULIN SYRINGE ULTRAFINE) 31G X 15/64" 1 ML MISC As directed 100 each 0  . lisinopril (PRINIVIL,ZESTRIL) 5 MG tablet Take 1 tablet (5 mg total) by mouth daily. 90 tablet 3  . lovastatin (MEVACOR) 40 MG tablet Take 1 tablet (40 mg total) by mouth at bedtime. 30 tablet 3  . metoprolol succinate (TOPROL-XL) 25 MG 24 hr tablet Take 1 tablet (25 mg total) by mouth daily. 90 tablet 3  . nitroGLYCERIN (NITROSTAT) 0.4 MG SL tablet Place 1 tablet  (0.4 mg total) under the tongue every 5 (five) minutes as needed for chest pain. 50 tablet 3  . prasugrel (EFFIENT) 10 MG TABS tablet Take 1 tablet (10 mg total) by mouth daily. 120 tablet 3  . TRUEPLUS LANCETS 30G MISC 1 each by Does not apply route 3 (three) times daily. 100 each 5  . ciprofloxacin (CIPRO) 500 MG tablet Take 1 tablet (500 mg total) by mouth 2 (two) times daily. (Patient not taking: Reported on 04/05/2015) 6 tablet 0   No current facility-administered medications on file prior to visit.     Review of Systems  Constitutional: Negative for activity change, appetite change and fatigue.  HENT: Negative for congestion, ear discharge, ear pain, sinus pressure and sore throat.        Ears feel clogged  Eyes: Negative for visual disturbance.  Respiratory: Negative for cough, chest tightness, shortness of breath and wheezing.   Cardiovascular: Negative for chest pain and palpitations.  Gastrointestinal: Negative for abdominal pain, constipation and abdominal distention.  Endocrine: Negative for polydipsia.  Genitourinary: Negative for dysuria and frequency.  Musculoskeletal: Negative for back pain and arthralgias.  Skin: Negative for rash.  Neurological: Negative for tremors, light-headedness and numbness.  Hematological: Does not bruise/bleed easily.  Psychiatric/Behavioral: Negative for behavioral problems and agitation.         Objective: Filed Vitals:   04/05/15 1205  BP: 160/80  Pulse: 79  Temp: 97.9 F (36.6 C)  Height: 5\' 2"  (1.575 m)  Weight: 279 lb (126.554 kg)  SpO2: 98%      Physical Exam  Constitutional: She is oriented to person, place, and time. She appears well-developed and well-nourished. No distress.  HENT:  Head: Normocephalic.  Right Ear: External ear normal.  Left Ear: External ear normal.  Nose: Nose normal.  Mouth/Throat: Oropharynx is clear and moist.  Cerumen obscuring bilateral tympanic membrane.  Eyes: Conjunctivae and EOM are normal.  Pupils are equal, round, and reactive to light.  Neck: Normal range of motion. No JVD present.  Cardiovascular: Normal rate, regular rhythm, normal heart sounds and intact distal pulses.  Exam reveals no gallop.   No murmur heard. Pulmonary/Chest: Effort normal and breath sounds normal. No respiratory distress. She has no wheezes. She has no rales. She exhibits no tenderness.  Abdominal: Soft. Bowel sounds are normal. She exhibits no distension and no mass. There is no tenderness.  Musculoskeletal: Normal range of motion. She exhibits no edema or tenderness.  Neurological: She is alert and oriented to person, place, and time. She has normal reflexes.  Skin: Skin is warm and dry. She is not diaphoretic.  Psychiatric: She has a normal  mood and affect.            Assessment & Plan:  54 year old female patient with a history of type 2 diabetes mellitus, hypertension, coronary artery disease status post stent who has been noncompliant with medications prior to now due to financial constraints here for a follow up of her Diabetes Mellitus.   Type 2 diabetes mellitus: Uncontrolled with A1c of 13.5. CBG is 144 Increase Lantus to  70 units twice daily, continue Novolog sliding scale as her random blood sugars have been in the 200-300 range.     Hypertension: Uncontrolled; she attributes this to being under a lot of stress lately. I will make no changes to her regimen as her BP has been controlled at her previous visits Continue Lisinopril and Toprol XL Educated on low sodium, DASH diet.  CAD status post stent: Continue dual antiplatelet therapy with ASA and Effient.  Recently seen by Dr. Cletus Gash next appointment is in one year.  Hyperlipidemia: Continue Statin  Cerumen impaction: Bilateral ear irrigation done  Obesity: Advised on weight loss and reducing portion sizes.  This note has been created with Surveyor, quantity. Any  transcriptional errors are unintentional.

## 2015-04-05 NOTE — Progress Notes (Signed)
Patient complains of left ear "being clogged" She states no other problems Needs refill on her Lantus

## 2015-04-05 NOTE — Patient Instructions (Signed)
Diabetes Mellitus and Food It is important for you to manage your blood sugar (glucose) level. Your blood glucose level can be greatly affected by what you eat. Eating healthier foods in the appropriate amounts throughout the day at about the same time each day will help you control your blood glucose level. It can also help slow or prevent worsening of your diabetes mellitus. Healthy eating may even help you improve the level of your blood pressure and reach or maintain a healthy weight.  HOW CAN FOOD AFFECT ME? Carbohydrates Carbohydrates affect your blood glucose level more than any other type of food. Your dietitian will help you determine how many carbohydrates to eat at each meal and teach you how to count carbohydrates. Counting carbohydrates is important to keep your blood glucose at a healthy level, especially if you are using insulin or taking certain medicines for diabetes mellitus. Alcohol Alcohol can cause sudden decreases in blood glucose (hypoglycemia), especially if you use insulin or take certain medicines for diabetes mellitus. Hypoglycemia can be a life-threatening condition. Symptoms of hypoglycemia (sleepiness, dizziness, and disorientation) are similar to symptoms of having too much alcohol.  If your health care provider has given you approval to drink alcohol, do so in moderation and use the following guidelines:  Women should not have more than one drink per day, and men should not have more than two drinks per day. One drink is equal to:  12 oz of beer.  5 oz of wine.  1 oz of hard liquor.  Do not drink on an empty stomach.  Keep yourself hydrated. Have water, diet soda, or unsweetened iced tea.  Regular soda, juice, and other mixers might contain a lot of carbohydrates and should be counted. WHAT FOODS ARE NOT RECOMMENDED? As you make food choices, it is important to remember that all foods are not the same. Some foods have fewer nutrients per serving than other  foods, even though they might have the same number of calories or carbohydrates. It is difficult to get your body what it needs when you eat foods with fewer nutrients. Examples of foods that you should avoid that are high in calories and carbohydrates but low in nutrients include:  Trans fats (most processed foods list trans fats on the Nutrition Facts label).  Regular soda.  Juice.  Candy.  Sweets, such as cake, pie, doughnuts, and cookies.  Fried foods. WHAT FOODS CAN I EAT? Have nutrient-rich foods, which will nourish your body and keep you healthy. The food you should eat also will depend on several factors, including:  The calories you need.  The medicines you take.  Your weight.  Your blood glucose level.  Your blood pressure level.  Your cholesterol level. You also should eat a variety of foods, including:  Protein, such as meat, poultry, fish, tofu, nuts, and seeds (lean animal proteins are best).  Fruits.  Vegetables.  Dairy products, such as milk, cheese, and yogurt (low fat is best).  Breads, grains, pasta, cereal, rice, and beans.  Fats such as olive oil, trans fat-free margarine, canola oil, avocado, and olives. DOES EVERYONE WITH DIABETES MELLITUS HAVE THE SAME MEAL PLAN? Because every person with diabetes mellitus is different, there is not one meal plan that works for everyone. It is very important that you meet with a dietitian who will help you create a meal plan that is just right for you. Document Released: 05/17/2005 Document Revised: 08/25/2013 Document Reviewed: 07/17/2013 [employer] Patient Information 2015 [employer]. This   information is not intended to replace advice given to you by your health care provider. Make sure you discuss any questions you have with your health care provider.  

## 2015-04-22 ENCOUNTER — Ambulatory Visit: Payer: Medicaid Other | Attending: Internal Medicine

## 2015-09-06 MED FILL — LISINOPRIL 5 MG TABLET: 5 | 30 days supply | Qty: 30 | Fill #4

## 2015-09-06 MED FILL — $Humalog 100u/ml vial: 100 | 28 days supply | Qty: 10 | Fill #4

## 2015-09-06 MED FILL — !LANTUS SOLOSTAR 100UNITS/M: 100 | 23 days supply | Qty: 15 | Fill #4

## 2015-09-08 MED FILL — $Effient 10mg tablets: 10 | 15 days supply | Qty: 15 | Fill #0

## 2015-10-13 MED FILL — LISINOPRIL 5 MG TABLET: 5 | 30 days supply | Qty: 30 | Fill #5

## 2015-10-13 MED FILL — $Effient 10mg tablets: 10 | 30 days supply | Qty: 30 | Fill #1

## 2015-10-18 MED FILL — !LANTUS SOLOSTAR 100UNITS/M: 100 | 9 days supply | Qty: 9 | Fill #1

## 2015-10-21 MED FILL — $Humalog 100u/ml vial: 100 | 28 days supply | Qty: 10 | Fill #5

## 2015-11-23 MED FILL — $Humalog 100u/ml vial: 100 | 28 days supply | Qty: 10 | Fill #6

## 2015-11-23 MED FILL — !LANTUS SOLOSTAR 100UNITS/M: 100 | 9 days supply | Qty: 9 | Fill #2

## 2015-12-26 MED FILL — !LANTUS SOLOSTAR 100UNITS/M: 100 | 9 days supply | Qty: 9 | Fill #3

## 2015-12-26 MED FILL — $Humalog 100u/ml vial: 100 | 28 days supply | Qty: 10 | Fill #7

## 2016-01-31 MED FILL — $LANTUS SOLOSTAR 100 UNITS/: 100 | 9 days supply | Qty: 9 | Fill #4

## 2016-01-31 MED FILL — $Humalog 100u/ml vial: 100 | 28 days supply | Qty: 10 | Fill #8

## 2016-03-07 MED FILL — HumaLOG 100 UNIT/ML SOLN: 100 | 28 days supply | Qty: 10 | Fill #9

## 2016-03-28 ENCOUNTER — Encounter: Payer: Self-pay | Admitting: Family Medicine

## 2016-03-28 ENCOUNTER — Ambulatory Visit: Payer: Self-pay | Attending: Family Medicine | Admitting: Family Medicine

## 2016-03-28 VITALS — BP 136/87 | HR 89 | Temp 98.4°F | Ht 62.0 in | Wt 268.8 lb

## 2016-03-28 DIAGNOSIS — E1122 Type 2 diabetes mellitus with diabetic chronic kidney disease: Secondary | ICD-10-CM | POA: Insufficient documentation

## 2016-03-28 DIAGNOSIS — Z7982 Long term (current) use of aspirin: Secondary | ICD-10-CM | POA: Insufficient documentation

## 2016-03-28 DIAGNOSIS — F418 Other specified anxiety disorders: Secondary | ICD-10-CM | POA: Insufficient documentation

## 2016-03-28 DIAGNOSIS — F419 Anxiety disorder, unspecified: Secondary | ICD-10-CM | POA: Insufficient documentation

## 2016-03-28 DIAGNOSIS — F32A Depression, unspecified: Secondary | ICD-10-CM

## 2016-03-28 DIAGNOSIS — F329 Major depressive disorder, single episode, unspecified: Secondary | ICD-10-CM | POA: Insufficient documentation

## 2016-03-28 DIAGNOSIS — N189 Chronic kidney disease, unspecified: Secondary | ICD-10-CM

## 2016-03-28 DIAGNOSIS — E1165 Type 2 diabetes mellitus with hyperglycemia: Secondary | ICD-10-CM

## 2016-03-28 DIAGNOSIS — Z9889 Other specified postprocedural states: Secondary | ICD-10-CM | POA: Insufficient documentation

## 2016-03-28 DIAGNOSIS — I10 Essential (primary) hypertension: Secondary | ICD-10-CM | POA: Insufficient documentation

## 2016-03-28 DIAGNOSIS — Z79899 Other long term (current) drug therapy: Secondary | ICD-10-CM | POA: Insufficient documentation

## 2016-03-28 DIAGNOSIS — Z6841 Body Mass Index (BMI) 40.0 and over, adult: Secondary | ICD-10-CM | POA: Insufficient documentation

## 2016-03-28 DIAGNOSIS — Z794 Long term (current) use of insulin: Secondary | ICD-10-CM | POA: Insufficient documentation

## 2016-03-28 DIAGNOSIS — I2510 Atherosclerotic heart disease of native coronary artery without angina pectoris: Secondary | ICD-10-CM | POA: Insufficient documentation

## 2016-03-28 LAB — GLUCOSE, POCT (MANUAL RESULT ENTRY): POC GLUCOSE: 366 mg/dL — AB (ref 70–99)

## 2016-03-28 LAB — POCT GLYCOSYLATED HEMOGLOBIN (HGB A1C): Hemoglobin A1C: 10.7

## 2016-03-28 MED ORDER — INSULIN LISPRO 100 UNIT/ML ~~LOC~~ SOLN: 12.0000 [IU] | Freq: Three times a day (TID) | SUBCUTANEOUS | 3 refills | Status: DC

## 2016-03-28 MED ORDER — METOPROLOL SUCCINATE ER 25 MG PO TB24: 25.0000 mg | ORAL_TABLET | Freq: Every day | ORAL | 3 refills | Status: DC

## 2016-03-28 MED ORDER — LOVASTATIN 40 MG PO TABS: 40.0000 mg | ORAL_TABLET | Freq: Every day | ORAL | 3 refills | Status: DC

## 2016-03-28 MED ORDER — PRASUGREL HCL 10 MG PO TABS: 10.0000 mg | ORAL_TABLET | Freq: Every day | ORAL | 3 refills | Status: DC

## 2016-03-28 MED ORDER — ESCITALOPRAM OXALATE 20 MG PO TABS: 20.0000 mg | ORAL_TABLET | Freq: Every day | ORAL | 3 refills | Status: DC

## 2016-03-28 MED ORDER — INSULIN GLARGINE 100 UNIT/ML SOLOSTAR PEN: 70.0000 [IU] | PEN_INJECTOR | Freq: Two times a day (BID) | SUBCUTANEOUS | 3 refills | Status: DC

## 2016-03-28 MED ORDER — LISINOPRIL 5 MG PO TABS: 5.0000 mg | ORAL_TABLET | Freq: Every day | ORAL | 3 refills | Status: DC

## 2016-03-28 MED ORDER — ASPIRIN EC 81 MG PO TBEC: 81.0000 mg | DELAYED_RELEASE_TABLET | Freq: Every day | ORAL | 3 refills | Status: DC

## 2016-03-28 MED FILL — LOVASTATIN 40 MG TABLET: 40 | 30 days supply | Qty: 30 | Fill #0

## 2016-03-28 MED FILL — $Humalog 100u/ml vial: 100 | 27 days supply | Qty: 10 | Fill #0

## 2016-03-28 MED FILL — ESCITALOPRAM 20 MG TABLET: 20 | 30 days supply | Qty: 30 | Fill #0

## 2016-03-28 MED FILL — METOPROLOL SUCC ER 25 MG TA: 25 | 30 days supply | Qty: 30 | Fill #0

## 2016-03-28 MED FILL — $LANTUS SOLOSTAR 100 UNITS/: 100 | 31 days supply | Qty: 45 | Fill #0

## 2016-03-28 MED FILL — LISINOPRIL 5 MG TABLET: 5 | 30 days supply | Qty: 30 | Fill #0

## 2016-03-28 MED FILL — $Effient 10mg tablets: 10 | 30 days supply | Qty: 30 | Fill #0

## 2016-03-28 NOTE — Patient Instructions (Signed)
Diabetes Mellitus and Food It is important for you to manage your blood sugar (glucose) level. Your blood glucose level can be greatly affected by what you eat. Eating healthier foods in the appropriate amounts throughout the day at about the same time each day will help you control your blood glucose level. It can also help slow or prevent worsening of your diabetes mellitus. Healthy eating may even help you improve the level of your blood pressure and reach or maintain a healthy weight.  General recommendations for healthful eating and cooking habits include:  Eating meals and snacks regularly. Avoid going long periods of time without eating to lose weight.  Eating a diet that consists mainly of plant-based foods, such as fruits, vegetables, nuts, legumes, and whole grains.  Using low-heat cooking methods, such as baking, instead of high-heat cooking methods, such as deep frying. Work with your dietitian to make sure you understand how to use the Nutrition Facts information on food labels. HOW CAN FOOD AFFECT ME? Carbohydrates Carbohydrates affect your blood glucose level more than any other type of food. Your dietitian will help you determine how many carbohydrates to eat at each meal and teach you how to count carbohydrates. Counting carbohydrates is important to keep your blood glucose at a healthy level, especially if you are using insulin or taking certain medicines for diabetes mellitus. Alcohol Alcohol can cause sudden decreases in blood glucose (hypoglycemia), especially if you use insulin or take certain medicines for diabetes mellitus. Hypoglycemia can be a life-threatening condition. Symptoms of hypoglycemia (sleepiness, dizziness, and disorientation) are similar to symptoms of having too much alcohol.  If your health care provider has given you approval to drink alcohol, do so in moderation and use the following guidelines:  Women should not have more than one drink per day, and men  should not have more than two drinks per day. One drink is equal to:  12 oz of beer.  5 oz of wine.  1 oz of hard liquor.  Do not drink on an empty stomach.  Keep yourself hydrated. Have water, diet soda, or unsweetened iced tea.  Regular soda, juice, and other mixers might contain a lot of carbohydrates and should be counted. WHAT FOODS ARE NOT RECOMMENDED? As you make food choices, it is important to remember that all foods are not the same. Some foods have fewer nutrients per serving than other foods, even though they might have the same number of calories or carbohydrates. It is difficult to get your body what it needs when you eat foods with fewer nutrients. Examples of foods that you should avoid that are high in calories and carbohydrates but low in nutrients include:  Trans fats (most processed foods list trans fats on the Nutrition Facts label).  Regular soda.  Juice.  Candy.  Sweets, such as cake, pie, doughnuts, and cookies.  Fried foods. WHAT FOODS CAN I EAT? Eat nutrient-rich foods, which will nourish your body and keep you healthy. The food you should eat also will depend on several factors, including:  The calories you need.  The medicines you take.  Your weight.  Your blood glucose level.  Your blood pressure level.  Your cholesterol level. You should eat a variety of foods, including:  Protein.  Lean cuts of meat.  Proteins low in saturated fats, such as fish, egg whites, and beans. Avoid processed meats.  Fruits and vegetables.  Fruits and vegetables that may help control blood glucose levels, such as apples, mangoes, and   yams.  Dairy products.  Choose fat-free or low-fat dairy products, such as milk, yogurt, and cheese.  Grains, bread, pasta, and rice.  Choose whole grain products, such as multigrain bread, whole oats, and brown rice. These foods may help control blood pressure.  Fats.  Foods containing healthful fats, such as nuts,  avocado, olive oil, canola oil, and fish. DOES EVERYONE WITH DIABETES MELLITUS HAVE THE SAME MEAL PLAN? Because every person with diabetes mellitus is different, there is not one meal plan that works for everyone. It is very important that you meet with a dietitian who will help you create a meal plan that is just right for you.   This information is not intended to replace advice given to you by your health care provider. Make sure you discuss any questions you have with your health care provider.   Document Released: 05/17/2005 Document Revised: 09/10/2014 Document Reviewed: 07/17/2013 Elsevier Interactive Patient Education 2016 [employer]  

## 2016-03-28 NOTE — Progress Notes (Signed)
Subjective:  Patient ID: Brenda [name], female    DOB: [birth year]  Age: 55 y.o. MRN: [id number]  CC: Diabetes; Depression (was on lexapro in the past); and Hypertension   HPI Brenda [name] is a 55 year old female with a history of type 2 diabetes mellitus (A1c 10.7 from today), obstructive sleep apnea, hypertension, CAD status post stent to LAD in 2010 who comes into the clinic for follow-up visit.  She has been out of her medications for 2 months and has been stress eating. She has custody of her nephew who has special needs and this stresses her out. Complains of anxiety and depression which occur chronically on a daily basis but denies suicidal ideation or intentions.  Has not been checking her blood sugars, and not compliant with a diabetic diet on low cholesterol diet, does not exercise.    Past Medical History:  Diagnosis Date  . Arthritis    "back, hands, hips" (02/22/2015)  . CAD (coronary artery disease)    s/p nstemi 05/2009 treated w drug-eluting stents  . CHF (congestive heart failure) ([place])   . Depression   . Diabetes mellitus ([place])    "started when I was pregnant; not sure if it was type 1 or type 2"   . Heart attack ([place]) 2010  . Hypercholesterolemia   . Hypertension   . Obesity   . Sleep apnea    "suppose to wear mask; I don't" (02/22/2015)    Past Surgical History:  Procedure Laterality Date  . CARDIAC CATHETERIZATION  12/2012  . CHOLECYSTECTOMY OPEN  1989  . CORONARY ANGIOPLASTY WITH STENT PLACEMENT  2011  . HERNIA REPAIR    . LEFT HEART CATHETERIZATION WITH CORONARY ANGIOGRAM N/A 12/25/2012   Procedure: LEFT HEART CATHETERIZATION WITH CORONARY ANGIOGRAM;  Surgeon: Sherren Mocha, MD;  Location: [facility] CATH LAB;  Service: Cardiovascular;  Laterality: N/A;  . UMBILICAL HERNIA REPAIR  1990's    Allergies  Allergen Reactions  . Clopidogrel Bisulfate     REACTION: rash  . Hydrocodeine [Dihydrocodeine]      Outpatient Medications Prior to  Visit  Medication Sig Dispense Refill  . Blood Glucose Monitoring Suppl (TRUE METRIX METER) DEVI 1 each by Does not apply route 3 (three) times daily. 1 Device 0  . glucose blood (TRUE METRIX BLOOD GLUCOSE TEST) test strip Use as instructed 100 each 5  . TRUEPLUS LANCETS 30G MISC 1 each by Does not apply route 3 (three) times daily. 100 each 5  . aspirin EC 81 MG tablet Take 1 tablet (81 mg total) by mouth daily. 30 tablet 3  . Insulin Glargine (LANTUS SOLOSTAR) 100 UNIT/ML Solostar Pen Inject 70 Units into the skin 2 (two) times daily. 40 mL 3  . lisinopril (PRINIVIL,ZESTRIL) 5 MG tablet Take 1 tablet (5 mg total) by mouth daily. 90 tablet 3  . lovastatin (MEVACOR) 40 MG tablet Take 1 tablet (40 mg total) by mouth at bedtime. 30 tablet 3  . metoprolol succinate (TOPROL-XL) 25 MG 24 hr tablet Take 1 tablet (25 mg total) by mouth daily. 90 tablet 3  . prasugrel (EFFIENT) 10 MG TABS tablet Take 1 tablet (10 mg total) by mouth daily. 120 tablet 3  . ciprofloxacin (CIPRO) 500 MG tablet Take 1 tablet (500 mg total) by mouth 2 (two) times daily. (Patient not taking: Reported on 04/05/2015) 6 tablet 0  . CRANBERRY PO Take 2 tablets by mouth daily.    . Insulin Syringe-Needle U-100 (BD INSULIN SYRINGE ULTRAFINE) 31G X 15/64"  1 ML MISC As directed 100 each 0  . nitroGLYCERIN (NITROSTAT) 0.4 MG SL tablet Place 1 tablet (0.4 mg total) under the tongue every 5 (five) minutes as needed for chest pain. (Patient not taking: Reported on 03/28/2016) 50 tablet 3  . insulin aspart (NOVOLOG) 100 UNIT/ML injection Inject 0-12 Units into the skin 3 (three) times daily before meals. According to sliding scale. (Patient not taking: Reported on 03/28/2016) 40 mL 3   No facility-administered medications prior to visit.     ROS Review of Systems  Constitutional: Negative for activity change, appetite change and fatigue.  HENT: Negative for congestion, sinus pressure and sore throat.   Eyes: Negative for visual disturbance.   Respiratory: Negative for cough, chest tightness, shortness of breath and wheezing.   Cardiovascular: Negative for chest pain and palpitations.  Gastrointestinal: Negative for abdominal distention, abdominal pain and constipation.  Endocrine: Negative for polydipsia.  Genitourinary: Negative for dysuria and frequency.  Musculoskeletal: Negative for arthralgias and back pain.  Skin: Negative for rash.  Neurological: Negative for tremors, light-headedness and numbness.  Hematological: Does not bruise/bleed easily.  Psychiatric/Behavioral: Positive for dysphoric mood. Negative for agitation, behavioral problems and suicidal ideas. The patient is nervous/anxious.     Objective:  BP 136/87 (BP Location: Right Arm, Patient Position: Sitting, Cuff Size: Large)   Pulse 89   Temp 98.4 F (36.9 C) (Oral)   Ht 5\' 2"  (1.575 m)   Wt 268 lb 12.8 oz (121.9 kg)   SpO2 98%   BMI 49.16 kg/m   BP/Weight 03/28/2016 04/05/2015 [id number]  Systolic BP [id number] 0000000 [id number]  Diastolic BP 87 80 76  Wt. (Lbs) 268.8 279 277.6  BMI 49.16 51.02 50.76      Physical Exam Constitutional: She is oriented to person, place, and time. She appears well-developed and well-nourished. No distress.  HENT:  Head: Normocephalic.  Right Ear: External ear normal.  Left Ear: External ear normal.  Nose: Nose normal.  Mouth/Throat: Oropharynx is clear and moist.  Eyes: Conjunctivae and EOM are normal. Pupils are equal, round, and reactive to light.  Neck: Normal range of motion. No JVD present.  Cardiovascular: Normal rate, regular rhythm, normal heart sounds and intact distal pulses.  Exam reveals no gallop.   No murmur heard. Pulmonary/Chest: Effort normal and breath sounds normal. No respiratory distress. She has no wheezes. She has no rales. She exhibits no tenderness.  Abdominal: Soft. Bowel sounds are normal. She exhibits no distension and no mass. There is no tenderness.  Musculoskeletal: Normal range of motion. She  exhibits no edema or tenderness.  Neurological: She is alert and oriented to person, place, and time. She has normal reflexes.  Skin: Skin is warm and dry. She is not diaphoretic.  Psychiatric: Depressed    Assessment & Plan:   1. Uncontrolled type 2 diabetes mellitus with chronic kidney disease, unspecified CKD stage, unspecified long term insulin use status ([place]) Uncontrolled with A1c of 10.7 due to noncompliance with medication and dietary regimen She promises to do better Discussed lifestyle changes including exercise and weight loss Foot exam performed today. - Glucose (CBG) - HgB A1c - Lipid panel; Future - COMPLETE METABOLIC PANEL WITH GFR; Future - Microalbumin / creatinine urine ratio; Future - Insulin Glargine (LANTUS SOLOSTAR) 100 UNIT/ML Solostar Pen; Inject 70 Units into the skin 2 (two) times daily.  Dispense: 40 mL; Refill: 3 - insulin lispro (HUMALOG) 100 UNIT/ML injection; Inject 0.12 mLs (12 Units total) into the skin 3 (three) times daily  with meals.  Dispense: 10 mL; Refill: 3  2. Anxiety and depression Currently under some family stressors Advised that therapy would also help - escitalopram (LEXAPRO) 20 MG tablet; Take 1 tablet (20 mg total) by mouth daily.  Dispense: 30 tablet; Refill: 3  3. Morbid obesity due to excess calories ([place]) Discussed reducing portion sizes, increasing physical activity  4. Atherosclerosis of native coronary artery of native heart without angina pectoris No angina at this time. Lifestyle modifications - aspirin EC 81 MG tablet; Take 1 tablet (81 mg total) by mouth daily.  Dispense: 30 tablet; Refill: 3 - lovastatin (MEVACOR) 40 MG tablet; Take 1 tablet (40 mg total) by mouth at bedtime.  Dispense: 30 tablet; Refill: 3 - prasugrel (EFFIENT) 10 MG TABS tablet; Take 1 tablet (10 mg total) by mouth daily.  Dispense: 30 tablet; Refill: 3  5. Essential hypertension Controlled - lisinopril (PRINIVIL,ZESTRIL) 5 MG tablet; Take 1 tablet (5  mg total) by mouth daily.  Dispense: 90 tablet; Refill: 3 - metoprolol succinate (TOPROL-XL) 25 MG 24 hr tablet; Take 1 tablet (25 mg total) by mouth daily.  Dispense: 30 tablet; Refill: 3   Meds ordered this encounter  Medications  . aspirin EC 81 MG tablet    Sig: Take 1 tablet (81 mg total) by mouth daily.    Dispense:  30 tablet    Refill:  3  . Insulin Glargine (LANTUS SOLOSTAR) 100 UNIT/ML Solostar Pen    Sig: Inject 70 Units into the skin 2 (two) times daily.    Dispense:  40 mL    Refill:  3  . insulin lispro (HUMALOG) 100 UNIT/ML injection    Sig: Inject 0.12 mLs (12 Units total) into the skin 3 (three) times daily with meals.    Dispense:  10 mL    Refill:  3  . lisinopril (PRINIVIL,ZESTRIL) 5 MG tablet    Sig: Take 1 tablet (5 mg total) by mouth daily.    Dispense:  90 tablet    Refill:  3  . lovastatin (MEVACOR) 40 MG tablet    Sig: Take 1 tablet (40 mg total) by mouth at bedtime.    Dispense:  30 tablet    Refill:  3  . metoprolol succinate (TOPROL-XL) 25 MG 24 hr tablet    Sig: Take 1 tablet (25 mg total) by mouth daily.    Dispense:  30 tablet    Refill:  3  . prasugrel (EFFIENT) 10 MG TABS tablet    Sig: Take 1 tablet (10 mg total) by mouth daily.    Dispense:  30 tablet    Refill:  3  . escitalopram (LEXAPRO) 20 MG tablet    Sig: Take 1 tablet (20 mg total) by mouth daily.    Dispense:  30 tablet    Refill:  3    Follow-up: Return in about 3 months (around 06/28/2016) for Follow-up on diabetes mellitus.   Arnoldo Morale MD

## 2016-03-28 NOTE — Progress Notes (Signed)
Patient is having issues with depression and would like to go back on lexapro- it's been 7 years since she last took the medication.  She is having "a lot of anxiety".

## 2016-03-29 ENCOUNTER — Ambulatory Visit: Payer: Medicaid Other | Attending: Family Medicine

## 2016-03-29 DIAGNOSIS — E1165 Type 2 diabetes mellitus with hyperglycemia: Secondary | ICD-10-CM

## 2016-03-29 DIAGNOSIS — E1122 Type 2 diabetes mellitus with diabetic chronic kidney disease: Secondary | ICD-10-CM | POA: Insufficient documentation

## 2016-03-29 DIAGNOSIS — N189 Chronic kidney disease, unspecified: Secondary | ICD-10-CM | POA: Insufficient documentation

## 2016-03-29 LAB — COMPLETE METABOLIC PANEL WITH GFR
ALBUMIN: 4 g/dL (ref 3.6–5.1)
ALT: 15 U/L (ref 6–29)
AST: 12 U/L (ref 10–35)
Alkaline Phosphatase: 96 U/L (ref 33–130)
BILIRUBIN TOTAL: 0.5 mg/dL (ref 0.2–1.2)
BUN: 10 mg/dL (ref 7–25)
CALCIUM: 9.5 mg/dL (ref 8.6–10.4)
CO2: 24 mmol/L (ref 20–31)
Chloride: 98 mmol/L (ref 98–110)
Creat: 0.83 mg/dL (ref 0.50–1.05)
GFR, EST NON AFRICAN AMERICAN: 81 mL/min (ref >=60)
GFR, Est African American: >89 mL/min (ref >=60)
GLUCOSE: 289 mg/dL — AB (ref 65–99)
POTASSIUM: 4.6 mmol/L (ref 3.5–5.3)
Sodium: 135 mmol/L (ref 135–146)
TOTAL PROTEIN: 7.5 g/dL (ref 6.1–8.1)

## 2016-03-29 LAB — LIPID PANEL
CHOL/HDL RATIO: 3.8 ratio (ref <=5.0)
CHOLESTEROL: 227 mg/dL — AB (ref 125–200)
HDL: 60 mg/dL (ref >=46)
LDL Cholesterol: 135 mg/dL — ABNORMAL HIGH (ref <130)
Triglycerides: 160 mg/dL — ABNORMAL HIGH (ref <150)
VLDL: 32 mg/dL — ABNORMAL HIGH (ref <30)

## 2016-03-30 LAB — MICROALBUMIN / CREATININE URINE RATIO
Creatinine, Urine: 75 mg/dL (ref 20–320)
Microalb Creat Ratio: 8 mcg/mg creat (ref <30)
Microalb, Ur: 0.6 mg/dL

## 2016-04-04 ENCOUNTER — Telehealth: Payer: Self-pay

## 2016-04-04 NOTE — Telephone Encounter (Signed)
Contacted pt to go over lab results pt is aware of results and doesn't have any questions or concerns 

## 2016-04-06 ENCOUNTER — Telehealth: Payer: Self-pay

## 2016-04-06 NOTE — Telephone Encounter (Signed)
-----   Message from Arnoldo Morale, MD sent at 03/30/2016  5:52 PM EDT ----- Cholesterol is elevated which could be due to her being out of her medications the last 2 months. Advised on compliance, low-cholesterol diet and exercise.

## 2016-04-06 NOTE — Telephone Encounter (Signed)
Writer called patient per Dr. Jarold Song to give her lab results.  Writer was unable to LVM.  Will try and call back later.

## 2016-04-09 ENCOUNTER — Telehealth: Payer: Self-pay

## 2016-04-09 NOTE — Telephone Encounter (Signed)
-----   Message from Arnoldo Morale, MD sent at 03/30/2016  5:52 PM EDT ----- Cholesterol is elevated which could be due to her being out of her medications the last 2 months. Advised on compliance, low-cholesterol diet and exercise.

## 2016-04-09 NOTE — Telephone Encounter (Signed)
Writer called patient regarding her lab results and was unable to leave a VM because it is not set up to take messages.

## 2016-05-09 MED FILL — $Humalog 100u/ml vial: 100 | 27 days supply | Qty: 10 | Fill #1

## 2016-05-09 MED FILL — $Effient 10mg tablets: 10 | 30 days supply | Qty: 30 | Fill #1

## 2016-05-09 MED FILL — ESCITALOPRAM 20 MG TABLET: 20 | 30 days supply | Qty: 30 | Fill #1

## 2016-05-09 MED FILL — $LANTUS SOLOSTAR 100 UNITS/: 100 | 31 days supply | Qty: 45 | Fill #1

## 2016-05-09 MED FILL — LISINOPRIL 5 MG TABLET: 5 | 30 days supply | Qty: 30 | Fill #1

## 2016-05-09 MED FILL — METOPROLOL SUCC ER 25 MG TA: 25 | 30 days supply | Qty: 30 | Fill #1

## 2016-05-09 MED FILL — LOVASTATIN 40 MG TABLET: 40 | 30 days supply | Qty: 30 | Fill #1

## 2016-05-31 ENCOUNTER — Emergency Department (HOSPITAL_COMMUNITY)
Admission: EM | Admit: 2016-05-31 | Discharge: 2016-05-31 | Disposition: A | Discharge status: Home / Self Care | Payer: Medicaid Other | Attending: Emergency Medicine | Admitting: Emergency Medicine

## 2016-05-31 ENCOUNTER — Encounter (HOSPITAL_COMMUNITY): Payer: Self-pay | Admitting: *Deleted

## 2016-05-31 ENCOUNTER — Emergency Department (HOSPITAL_COMMUNITY): Payer: Medicaid Other

## 2016-05-31 DIAGNOSIS — R51 Headache: Secondary | ICD-10-CM | POA: Insufficient documentation

## 2016-05-31 DIAGNOSIS — R519 Headache, unspecified: Secondary | ICD-10-CM

## 2016-05-31 DIAGNOSIS — Z794 Long term (current) use of insulin: Secondary | ICD-10-CM | POA: Insufficient documentation

## 2016-05-31 DIAGNOSIS — I2510 Atherosclerotic heart disease of native coronary artery without angina pectoris: Secondary | ICD-10-CM | POA: Insufficient documentation

## 2016-05-31 DIAGNOSIS — N390 Urinary tract infection, site not specified: Secondary | ICD-10-CM | POA: Insufficient documentation

## 2016-05-31 DIAGNOSIS — I509 Heart failure, unspecified: Secondary | ICD-10-CM | POA: Insufficient documentation

## 2016-05-31 DIAGNOSIS — Z87891 Personal history of nicotine dependence: Secondary | ICD-10-CM | POA: Insufficient documentation

## 2016-05-31 DIAGNOSIS — I110 Hypertensive heart disease with heart failure: Secondary | ICD-10-CM | POA: Insufficient documentation

## 2016-05-31 DIAGNOSIS — E119 Type 2 diabetes mellitus without complications: Secondary | ICD-10-CM | POA: Insufficient documentation

## 2016-05-31 DIAGNOSIS — Z7982 Long term (current) use of aspirin: Secondary | ICD-10-CM | POA: Insufficient documentation

## 2016-05-31 DIAGNOSIS — Z955 Presence of coronary angioplasty implant and graft: Secondary | ICD-10-CM | POA: Insufficient documentation

## 2016-05-31 LAB — COMPREHENSIVE METABOLIC PANEL
ALT: 14 U/L (ref 14–54)
AST: 15 U/L (ref 15–41)
Albumin: 3.8 g/dL (ref 3.5–5.0)
Alkaline Phosphatase: 77 U/L (ref 38–126)
Anion gap: 7 (ref 5–15)
BUN: 13 mg/dL (ref 6–20)
CHLORIDE: 102 mmol/L (ref 101–111)
CO2: 26 mmol/L (ref 22–32)
CREATININE: 0.78 mg/dL (ref 0.44–1.00)
Calcium: 8.6 mg/dL — ABNORMAL LOW (ref 8.9–10.3)
GFR calc Af Amer: >60 mL/min (ref >60)
Glucose, Bld: 232 mg/dL — ABNORMAL HIGH (ref 65–99)
Potassium: 4.3 mmol/L (ref 3.5–5.1)
Sodium: 135 mmol/L (ref 135–145)
Total Bilirubin: 0.4 mg/dL (ref 0.3–1.2)
Total Protein: 7.5 g/dL (ref 6.5–8.1)

## 2016-05-31 LAB — URINALYSIS, ROUTINE W REFLEX MICROSCOPIC
GLUCOSE, UA: 250 mg/dL — AB
HGB URINE DIPSTICK: NEGATIVE
KETONES UR: NEGATIVE mg/dL
Leukocytes, UA: NEGATIVE
Nitrite: POSITIVE — AB
PH: 5.5 (ref 5.0–8.0)
Protein, ur: >300 mg/dL — AB
Specific Gravity, Urine: 1.037 — ABNORMAL HIGH (ref 1.005–1.030)

## 2016-05-31 LAB — I-STAT TROPONIN, ED: TROPONIN I, POC: 0.01 ng/mL (ref 0.00–0.08)

## 2016-05-31 LAB — URINE MICROSCOPIC-ADD ON: RBC / HPF: NONE SEEN RBC/hpf (ref 0–5)

## 2016-05-31 LAB — CBC
HEMATOCRIT: 44.2 % (ref 36.0–46.0)
Hemoglobin: 15 g/dL (ref 12.0–15.0)
MCH: 29 pg (ref 26.0–34.0)
MCHC: 33.9 g/dL (ref 30.0–36.0)
MCV: 85.3 fL (ref 78.0–100.0)
PLATELETS: 304 10*3/uL (ref 150–400)
RBC: 5.18 MIL/uL — AB (ref 3.87–5.11)
RDW: 12.8 % (ref 11.5–15.5)
WBC: 13.5 10*3/uL — AB (ref 4.0–10.5)

## 2016-05-31 LAB — BLOOD GAS, VENOUS
ACID-BASE EXCESS: 0.3 mmol/L (ref 0.0–2.0)
BICARBONATE: 24.7 mmol/L (ref 20.0–28.0)
O2 Saturation: 91.9 %
PATIENT TEMPERATURE: 98.6
PCO2 VEN: 41.1 mmHg — AB (ref 44.0–60.0)
PH VEN: 7.396 (ref 7.250–7.430)
pO2, Ven: 65.1 mmHg — ABNORMAL HIGH (ref 32.0–45.0)

## 2016-05-31 LAB — TSH: TSH: 0.609 u[IU]/mL (ref 0.350–4.500)

## 2016-05-31 LAB — T4, FREE: FREE T4: 0.88 ng/dL (ref 0.61–1.12)

## 2016-05-31 LAB — SEDIMENTATION RATE: Sed Rate: 18 mm/hr (ref 0–22)

## 2016-05-31 MED ORDER — DEXTROSE 5 % IV SOLN
1.0000 g | Freq: Once | INTRAVENOUS | Status: AC
  Administered 2016-05-31: 1 g via INTRAVENOUS
  Filled 2016-05-31: qty 10

## 2016-05-31 MED ORDER — ONDANSETRON 8 MG PO TBDP
8.0000 mg | ORAL_TABLET | Freq: Once | ORAL | Status: AC
  Administered 2016-05-31: 8 mg via ORAL
  Filled 2016-05-31: qty 1

## 2016-05-31 MED ORDER — SODIUM CHLORIDE 0.9 % IV SOLN
1000.0000 mL | Freq: Once | INTRAVENOUS | Status: AC
  Administered 2016-05-31: 1000 mL via INTRAVENOUS

## 2016-05-31 MED ORDER — CEPHALEXIN 500 MG PO CAPS: 500.0000 mg | ORAL_CAPSULE | Freq: Two times a day (BID) | ORAL | 0 refills | Status: DC

## 2016-05-31 MED ORDER — IOPAMIDOL (ISOVUE-370) INJECTION 76%
100.0000 mL | Freq: Once | INTRAVENOUS | Status: AC | PRN
  Administered 2016-05-31: 100 mL via INTRAVENOUS

## 2016-05-31 MED ORDER — SODIUM CHLORIDE 0.9 % IV SOLN
1000.0000 mL | INTRAVENOUS | Status: DC
  Administered 2016-05-31: 1000 mL via INTRAVENOUS

## 2016-05-31 MED ORDER — PROCHLORPERAZINE EDISYLATE 5 MG/ML IJ SOLN
10.0000 mg | Freq: Once | INTRAMUSCULAR | Status: AC
  Administered 2016-05-31: 10 mg via INTRAVENOUS
  Filled 2016-05-31: qty 2

## 2016-05-31 NOTE — ED Notes (Signed)
MD at bedside. 

## 2016-05-31 NOTE — ED Notes (Signed)
Bed: WTR9 Expected date:  Expected time:  Means of arrival:  Comments: 

## 2016-05-31 NOTE — Discharge Instructions (Signed)
As discussed, your evaluation today has been largely reassuring.  But, it is important that you monitor your condition carefully, and do not hesitate to return to the ED if you develop new, or concerning changes in your condition. ? ?Otherwise, please follow-up with your physician for appropriate ongoing care. ? ?

## 2016-05-31 NOTE — ED Triage Notes (Signed)
Per EMS - patient comes from home with c/o headache and N/V.  Patient took some Tylenol with no relief.  Patient denies dizziness, SOB and chest pain.  Patient's vitals, 160/80, HR 96, 98% on RA, CBG 124.

## 2016-05-31 NOTE — ED Provider Notes (Signed)
[facility] DEPT Provider Note   CSN: [id number] Arrival date & time: 05/31/16  1035     History   Chief Complaint Chief Complaint  Patient presents with  . Headache    HPI Brenda [name] is a 55 y.o. female.  HPI  Patient presents with concern of headache. Headache is sharp, severe, awakening her from sleeping at 9 AM, 5 hours ago. There is associated nausea, vomiting. Headache is focal in the right anterior superior aspect. Notably, the patient had a similar headache 3 weeks ago, though less severe, less sustained. In the interval, the patient has had no headache, denies history of migraines or chronic headache. She does have multiple medical issues, including CAD, CHF, takes Effient and of the medication regularly. No other recent medication changes, diet changes, activity changes.    Past Medical History:  Diagnosis Date  . Arthritis    "back, hands, hips" (02/22/2015)  . CAD (coronary artery disease)    s/p nstemi 05/2009 treated w drug-eluting stents  . CHF (congestive heart failure) ([place])   . Depression   . Diabetes mellitus ([place])    "started when I was pregnant; not sure if it was type 1 or type 2"   . Heart attack ([place]) 2010  . Hypercholesterolemia   . Hypertension   . Obesity   . Sleep apnea    "suppose to wear mask; I don't" (02/22/2015)    Patient Active Problem List   Diagnosis Date Noted  . Anxiety and depression 03/28/2016  . DM type 2, uncontrolled, with renal complications ([place]) [id number]  . Morbid obesity ([place]) 02/22/2015  . Hyperglycemia 02/22/2015  . Dyspnea 03/11/2013  . Hyperlipidemia 05/25/2009  . Coronary atherosclerosis of native coronary artery 05/25/2009  . Essential hypertension 05/24/2009  . Sleep apnea 05/24/2009    Past Surgical History:  Procedure Laterality Date  . CARDIAC CATHETERIZATION  12/2012  . CHOLECYSTECTOMY OPEN  1989  . CORONARY ANGIOPLASTY WITH STENT PLACEMENT  2011  . HERNIA REPAIR    . LEFT HEART  CATHETERIZATION WITH CORONARY ANGIOGRAM N/A 12/25/2012   Procedure: LEFT HEART CATHETERIZATION WITH CORONARY ANGIOGRAM;  Surgeon: Sherren Mocha, MD;  Location[facility] C[facility] LAB;  Service: Cardiovascular;  Laterality: N/A;  . UMBILICAL HERNIA REPAIR  1990's    OB History    No data available       Home Medications    Prior to Admission medications   Medication Sig Start Date End Date Taking? Authorizing Provider  aspirin EC 81 MG tablet Take 1 tablet (81 mg total) by mouth daily. 03/28/16   Arnoldo Morale, MD  Blood Glucose Monitoring Suppl (TRUE METRIX METER) DEVI 1 each by Does not apply route 3 (three) times daily. 02/28/15   Arnoldo Morale, MD  ciprofloxacin (CIPRO) 500 MG tablet Take 1 tablet (500 mg total) by mouth 2 (two) times daily. Patient not taking: Reported on 04/05/2015 02/24/15   Delfina Redwood, MD  CRANBERRY PO Take 2 tablets by mouth daily.    Historical Provider, MD  escitalopram (LEXAPRO) 20 MG tablet Take 1 tablet (20 mg total) by mouth daily. 03/28/16   Arnoldo Morale, MD  glucose blood (TRUE METRIX BLOOD GLUCOSE TEST) test strip Use as instructed 02/28/15   Arnoldo Morale, MD  Insulin Glargine (LANTUS SOLOSTAR) 100 UNIT/ML Solostar Pen Inject 70 Units into the skin 2 (two) times daily. 03/28/16   Arnoldo Morale, MD  insulin lispro (HUMALOG) 100 UNIT/ML injection Inject 0.12 mLs (12 Units total) into the skin 3 (three) times  daily with meals. 03/28/16   Arnoldo Morale, MD  Insulin Syringe-Needle U-100 (BD INSULIN SYRINGE ULTRAFINE) 31G X 15/64" 1 ML MISC As directed 02/28/15   Arnoldo Morale, MD  lisinopril (PRINIVIL,ZESTRIL) 5 MG tablet Take 1 tablet (5 mg total) by mouth daily. 03/28/16   Arnoldo Morale, MD  lovastatin (MEVACOR) 40 MG tablet Take 1 tablet (40 mg total) by mouth at bedtime. 03/28/16   Arnoldo Morale, MD  metoprolol succinate (TOPROL-XL) 25 MG 24 hr tablet Take 1 tablet (25 mg total) by mouth daily. 03/28/16   Arnoldo Morale, MD  nitroGLYCERIN (NITROSTAT) 0.4 MG SL tablet Place 1  tablet (0.4 mg total) under the tongue every 5 (five) minutes as needed for chest pain. Patient not taking: Reported on 03/28/2016 03/23/15   Renella Cunas, MD  prasugrel (EFFIENT) 10 MG TABS tablet Take 1 tablet (10 mg total) by mouth daily. 03/28/16   Arnoldo Morale, MD  TRUEPLUS LANCETS 30G MISC 1 each by Does not apply route 3 (three) times daily. 02/28/15   Arnoldo Morale, MD    Family History Family History  Problem Relation Age of Onset  . Heart attack Father     in his mid 45s  . COPD Father   . Coronary artery disease Mother     in her mid 35s  . Hypertension Mother   . Cancer Mother   . Diabetes Sister   . Coronary artery disease Sister     valve replacement  . Stroke Sister     multiple    Social History Social History  Substance Use Topics  . Smoking status: Former Smoker    Packs/day: 0.50    Years: 1.00    Types: Cigarettes, Cigars    Quit date: 09/03/1986  . Smokeless tobacco: Never Used  . Alcohol use No     Allergies   Clopidogrel bisulfate and Hydrocodeine [dihydrocodeine]   Review of Systems Review of Systems  Constitutional:       Per HPI, otherwise negative  HENT:       Per HPI, otherwise negative  Eyes: Negative for photophobia and visual disturbance.  Respiratory:       Per HPI, otherwise negative  Cardiovascular:       Per HPI, otherwise negative  Gastrointestinal: Positive for nausea and vomiting.  Endocrine:       Negative aside from HPI  Genitourinary:       Neg aside from HPI   Musculoskeletal: Negative for neck pain and neck stiffness.  Skin: Negative.   Allergic/Immunologic: Negative for immunocompromised state.  Neurological: Positive for headaches. Negative for syncope.     Physical Exam Updated Vital Signs BP 146/68 (BP Location: Right Arm)   Pulse 69   Temp 98 F (36.7 C) (Oral)   Resp 17   Ht 5\' 2"  (1.575 m)   Wt 290 lb (131.5 kg)   SpO2 97%   BMI 53.04 kg/m   Physical Exam  Constitutional: She is oriented to  person, place, and time. She appears well-developed and well-nourished. No distress.  Hirsuite obese elderly female  HENT:  Head: Normocephalic and atraumatic.  Eyes: Conjunctivae and EOM are normal.  Neck: Full passive range of motion without pain. No Kernig's sign noted.  Cardiovascular: Normal rate and regular rhythm.   Pulmonary/Chest: Effort normal and breath sounds normal. No stridor. No respiratory distress.  Abdominal: She exhibits no distension.  Musculoskeletal: She exhibits no edema.  Neurological: She is alert and oriented to person, place, and time.  She displays no atrophy and no tremor. No cranial nerve deficit. She exhibits normal muscle tone. She displays no seizure activity.  Skin: Skin is warm and dry.  Psychiatric: She has a normal mood and affect.  Nursing note and vitals reviewed.    ED Treatments / Results  Labs (all labs ordered are listed, but only abnormal results are displayed) Labs Reviewed  CBC - Abnormal; Notable for the following:       Result Value   WBC 13.5 (*)    RBC 5.18 (*)    All other components within normal limits  COMPREHENSIVE METABOLIC PANEL - Abnormal; Notable for the following:    Glucose, Bld 232 (*)    Calcium 8.6 (*)    All other components within normal limits  BLOOD GAS, VENOUS - Abnormal; Notable for the following:    pCO2, Ven 41.1 (*)    pO2, Ven 65.1 (*)    All other components within normal limits  URINALYSIS, ROUTINE W REFLEX MICROSCOPIC (NOT AT A [facility]) - Abnormal; Notable for the following:    Color, Urine AMBER (*)    APPearance CLOUDY (*)    Specific Gravity, Urine 1.037 (*)    Glucose, UA 250 (*)    Bilirubin Urine SMALL (*)    Protein, ur >300 (*)    Nitrite POSITIVE (*)    All other components within normal limits  URINE MICROSCOPIC-ADD ON - Abnormal; Notable for the following:    Squamous Epithelial / LPF 6-30 (*)    Bacteria, UA MANY (*)    Crystals CA OXALATE CRYSTALS (*)    All other components within  normal limits  SEDIMENTATION RATE  TSH  T4, FREE  I-STAT TROPOININ, ED    Radiology Ct Angio Head W Or Wo Contrast  Result Date: 05/31/2016 CLINICAL DATA:  Sudden onset headache with severe nausea and vomiting. EXAM: CT ANGIOGRAPHY HEAD TECHNIQUE: Multidetector CT imaging of the head was performed using the standard protocol during bolus administration of intravenous contrast. Multiplanar CT image reconstructions and MIPs were obtained to evaluate the vascular anatomy. CONTRAST:  100 cc Isovue 370 intravenous COMPARISON:  05/01/2009 head CT. FINDINGS: CT HEAD Brain: No evidence of acute infarction, hemorrhage, hydrocephalus, extra-axial collection or mass lesion/mass effect. Vascular: No hyperdense vessel or unexpected calcification. Skull: Normal. Negative for fracture or focal lesion. Sinuses: No sinusitis. Partial opacification of air cells in the mastoid tips. Orbits: No acute finding. CTA HEAD Limited by venous contamination. Anterior circulation: Symmetric carotid arteries and branching. No visible posterior communicating arteries. No major branch occlusion or flow limiting stenosis. No noted atheromatous changes, aneurysm, or beading. Isodense veins and anterior cerebral branches cross at the level of the anterior interhemispheric fissure. No visible nidus to suggest vascular malformation. Posterior circulation: Mild right vertebral artery dominance. Standard vertebrobasilar branching. No flow limiting stenosis or major branch occlusion. No beading or aneurysm. Venous sinuses: Patent Anatomic variants: Incomplete circle-of-Willis. Delayed phase: No parenchymal enhancement/mass. IMPRESSION: Negative.  No explanation for headache. Electronically Signed   By: Monte Fantasia M.D.   On: 05/31/2016 16:07    Procedures Procedures (including critical care time)  Medications Ordered in ED Medications  0.9 %  sodium chloride infusion (not administered)    Followed by  0.9 %  sodium chloride  infusion (not administered)  prochlorperazine (COMPAZINE) injection 10 mg (not administered)  ondansetron (ZOFRAN-ODT) disintegrating tablet 8 mg (8 mg Oral Given 05/31/16 1118)     Initial Impression / Assessment and Plan / ED Course  I have reviewed the triage vital signs and the nursing notes.  Pertinent labs & imaging results that were available during my care of the patient were reviewed by me and considered in my medical decision making (see chart for details).  Clinical Course    4:23 PM Patient states that she feels better. Aware of initial results, including the reassuring CT scan, labs consistent with urinary tract infection.   Final Clinical Impressions(s) / ED Diagnoses  Patient presents with headache. Patient's description of severe sudden onset, awakening her from sleep, is concerning, but the patient has no focal neurologic deficits. However, given the patient's history of multiple medical issues, including hypertension, vasculopathy, subarachnoid, aneurysm considered. CT, CT angiography, reassuring. Patient does have evidence for urinary tract infection, with leukocytosis, and received antibiotics after initial fluids, headache medication. On discharge patient substantially improved, appropriate for outpatient follow-up. Thyroid studies pending outpatient aware of these as well.    Carmin Muskrat, MD 05/31/16 [phone]

## 2016-06-14 MED FILL — $Effient 10mg tablets: 10 | 30 days supply | Qty: 30 | Fill #2

## 2016-06-14 MED FILL — ?ESCITALOPRAM 20 MG TABLET: 20 | 30 days supply | Qty: 30 | Fill #2

## 2016-06-14 MED FILL — ?LOVASTATIN 40 MG TABLET: 40 | 30 days supply | Qty: 30 | Fill #2

## 2016-06-14 MED FILL — $LANTUS SOLOSTAR 100 UNITS/: 100 | 14 days supply | Qty: 21 | Fill #2

## 2016-06-14 MED FILL — METOPROLOL SUCC ER 25 MG TA: 25 | 30 days supply | Qty: 30 | Fill #2

## 2016-06-14 MED FILL — ?LISINOPRIL 5 MG TABLET: 5 | 30 days supply | Qty: 30 | Fill #2

## 2016-06-14 MED FILL — $Humalog 100u/ml vial: 100 | 27 days supply | Qty: 10 | Fill #2

## 2016-07-31 ENCOUNTER — Telehealth: Payer: Self-pay | Admitting: Family Medicine

## 2016-08-16 ENCOUNTER — Encounter: Payer: Self-pay | Admitting: Family Medicine

## 2016-08-16 ENCOUNTER — Encounter: Payer: Self-pay | Admitting: Licensed Clinical Social Worker

## 2016-08-16 ENCOUNTER — Other Ambulatory Visit: Payer: Self-pay

## 2016-08-16 ENCOUNTER — Ambulatory Visit: Payer: Self-pay | Attending: Family Medicine | Admitting: Family Medicine

## 2016-08-16 VITALS — BP 133/82 | HR 86 | Temp 98.3°F | Ht 62.0 in | Wt 270.4 lb

## 2016-08-16 DIAGNOSIS — F419 Anxiety disorder, unspecified: Secondary | ICD-10-CM | POA: Insufficient documentation

## 2016-08-16 DIAGNOSIS — E1122 Type 2 diabetes mellitus with diabetic chronic kidney disease: Secondary | ICD-10-CM | POA: Insufficient documentation

## 2016-08-16 DIAGNOSIS — IMO0002 Reserved for concepts with insufficient information to code with codable children: Secondary | ICD-10-CM

## 2016-08-16 DIAGNOSIS — N189 Chronic kidney disease, unspecified: Secondary | ICD-10-CM | POA: Insufficient documentation

## 2016-08-16 DIAGNOSIS — E1121 Type 2 diabetes mellitus with diabetic nephropathy: Secondary | ICD-10-CM

## 2016-08-16 DIAGNOSIS — Z79899 Other long term (current) drug therapy: Secondary | ICD-10-CM | POA: Insufficient documentation

## 2016-08-16 DIAGNOSIS — E1165 Type 2 diabetes mellitus with hyperglycemia: Secondary | ICD-10-CM

## 2016-08-16 DIAGNOSIS — Z794 Long term (current) use of insulin: Secondary | ICD-10-CM | POA: Insufficient documentation

## 2016-08-16 DIAGNOSIS — Z7982 Long term (current) use of aspirin: Secondary | ICD-10-CM | POA: Insufficient documentation

## 2016-08-16 DIAGNOSIS — N3000 Acute cystitis without hematuria: Secondary | ICD-10-CM | POA: Insufficient documentation

## 2016-08-16 DIAGNOSIS — F32A Depression, unspecified: Secondary | ICD-10-CM

## 2016-08-16 DIAGNOSIS — Z9889 Other specified postprocedural states: Secondary | ICD-10-CM | POA: Insufficient documentation

## 2016-08-16 DIAGNOSIS — I10 Essential (primary) hypertension: Secondary | ICD-10-CM

## 2016-08-16 DIAGNOSIS — Z9049 Acquired absence of other specified parts of digestive tract: Secondary | ICD-10-CM | POA: Insufficient documentation

## 2016-08-16 DIAGNOSIS — F418 Other specified anxiety disorders: Secondary | ICD-10-CM

## 2016-08-16 DIAGNOSIS — Z5189 Encounter for other specified aftercare: Secondary | ICD-10-CM | POA: Insufficient documentation

## 2016-08-16 DIAGNOSIS — F329 Major depressive disorder, single episode, unspecified: Secondary | ICD-10-CM | POA: Insufficient documentation

## 2016-08-16 DIAGNOSIS — I2510 Atherosclerotic heart disease of native coronary artery without angina pectoris: Secondary | ICD-10-CM | POA: Insufficient documentation

## 2016-08-16 DIAGNOSIS — I130 Hypertensive heart and chronic kidney disease with heart failure and stage 1 through stage 4 chronic kidney disease, or unspecified chronic kidney disease: Secondary | ICD-10-CM | POA: Insufficient documentation

## 2016-08-16 LAB — POCT URINALYSIS DIPSTICK
Bilirubin, UA: NEGATIVE
Blood, UA: NEGATIVE
GLUCOSE UA: NEGATIVE
KETONES UA: NEGATIVE
Nitrite, UA: NEGATIVE
Protein, UA: 30
SPEC GRAV UA: 1.025
Urobilinogen, UA: 2
pH, UA: 6

## 2016-08-16 LAB — GLUCOSE, POCT (MANUAL RESULT ENTRY): POC Glucose: 168 mg/dl — AB (ref 70–99)

## 2016-08-16 LAB — POCT GLYCOSYLATED HEMOGLOBIN (HGB A1C): Hemoglobin A1C: 10.6

## 2016-08-16 MED ORDER — METOPROLOL SUCCINATE ER 25 MG PO TB24: 25.0000 mg | ORAL_TABLET | Freq: Every day | ORAL | 5 refills | Status: DC

## 2016-08-16 MED ORDER — TRAZODONE HCL 50 MG PO TABS: 50.0000 mg | ORAL_TABLET | Freq: Every day | ORAL | 5 refills | Status: DC

## 2016-08-16 MED ORDER — PRASUGREL HCL 10 MG PO TABS: 10.0000 mg | ORAL_TABLET | Freq: Every day | ORAL | 5 refills | Status: DC

## 2016-08-16 MED ORDER — INSULIN GLARGINE 100 UNIT/ML SOLOSTAR PEN: PEN_INJECTOR | SUBCUTANEOUS | 3 refills | Status: DC

## 2016-08-16 MED ORDER — INSULIN LISPRO 100 UNIT/ML ~~LOC~~ SOLN: 0.0000 [IU] | Freq: Three times a day (TID) | SUBCUTANEOUS | 5 refills | Status: DC

## 2016-08-16 MED ORDER — CIPROFLOXACIN HCL 500 MG PO TABS: 500.0000 mg | ORAL_TABLET | Freq: Two times a day (BID) | ORAL | 0 refills | Status: DC

## 2016-08-16 MED ORDER — LISINOPRIL 5 MG PO TABS: 5.0000 mg | ORAL_TABLET | Freq: Every day | ORAL | 5 refills | Status: DC

## 2016-08-16 MED ORDER — NITROGLYCERIN 0.4 MG SL SUBL: 0.4000 mg | SUBLINGUAL_TABLET | SUBLINGUAL | 5 refills | Status: DC | PRN

## 2016-08-16 MED ORDER — ESCITALOPRAM OXALATE 20 MG PO TABS: 20.0000 mg | ORAL_TABLET | Freq: Every day | ORAL | 5 refills | Status: DC

## 2016-08-16 MED FILL — ?CIPROFLOXACIN HCL 500MG TA: 500 | 3 days supply | Qty: 6 | Fill #0

## 2016-08-16 MED FILL — METOPROLOL SUCC ER 25 MG TA: 25 | 30 days supply | Qty: 30 | Fill #0

## 2016-08-16 MED FILL — traZODone HCL 50 MG TABS: 50 | 30 days supply | Qty: 30 | Fill #0

## 2016-08-16 MED FILL — $Humalog 100u/ml vial: 100 | 27 days supply | Qty: 10 | Fill #0

## 2016-08-16 MED FILL — NITROSTAT 0.4 MG TABLET SL: 0.4 | 25 days supply | Qty: 25 | Fill #0

## 2016-08-16 MED FILL — $Effient 10mg tablets: 10 | 30 days supply | Qty: 30 | Fill #0

## 2016-08-16 MED FILL — ?ESCITALOPRAM 20 MG TABLET: 20 | 30 days supply | Qty: 30 | Fill #0

## 2016-08-16 MED FILL — $LANTUS SOLOSTAR 100 UNITS/: 100 | 30 days supply | Qty: 45 | Fill #0

## 2016-08-16 MED FILL — LISINOPRIL 5 MG TABLET: 5 | 30 days supply | Qty: 30 | Fill #0

## 2016-08-16 NOTE — Progress Notes (Signed)
Subjective:  Patient ID: Brenda [name], female    DOB: Aug 28, 1961  Age: 55 y.o. MRN: [id number]  CC: Diabetes; Urinary Tract Infection ("lower back pain"); and Anxiety   HPI Brenda [name] is a 55 year old female with a history of type 2 diabetes mellitus (A1c 10.6 from today), obstructive sleep apnea, hypertension, CAD status post stent to LAD in 2010 who comes into the clinic for follow-up visit.  She has been compliant with her medications and A1c is 10.6 today compared with 10.7 from her last visit but has been stress eating. She has custody of her nephew who has special needs and this stresses her out. Complains of anxiety and depression which occur chronically on a daily basis but denies suicidal ideation or intentions. Taking Lexapro which helps some but she states she has anxiety and depression and more days and not  Has not been checking her blood sugars, and not compliant with a diabetic diet on low cholesterol diet, does not exercise.  Complains of intermittent chest pains which occur with exertion but denies any at this time; she has not been to see a cardiologist in a while. Denies shortness of breath or palpitations.  Past Medical History:  Diagnosis Date  . Arthritis    "back, hands, hips" (02/22/2015)  . CAD (coronary artery disease)    s/p nstemi 05/2009 treated w drug-eluting stents  . CHF (congestive heart failure) ([place])   . Depression   . Diabetes mellitus ([place])    "started when I was pregnant; not sure if it was type 1 or type 2"   . Heart attack 2010  . Hypercholesterolemia   . Hypertension   . Obesity   . Sleep apnea    "suppose to wear mask; I don't" (02/22/2015)    Past Surgical History:  Procedure Laterality Date  . CARDIAC CATHETERIZATION  12/2012  . CHOLECYSTECTOMY OPEN  1989  . CORONARY ANGIOPLASTY WITH STENT PLACEMENT  2011  . HERNIA REPAIR    . LEFT HEART CATHETERIZATION WITH CORONARY ANGIOGRAM N/A 12/25/2012   Procedure: LEFT HEART  CATHETERIZATION WITH CORONARY ANGIOGRAM;  Surgeon: Sherren Mocha, MD;  Location: [facility] CATH LAB;  Service: Cardiovascular;  Laterality: N/A;  . UMBILICAL HERNIA REPAIR  1990's    Allergies  Allergen Reactions  . Hydrocodone Shortness Of Breath    "I forget to breathe."   . Clopidogrel Bisulfate     REACTION: rash  . Hydrocodeine [Dihydrocodeine]      Outpatient Medications Prior to Visit  Medication Sig Dispense Refill  . aspirin EC 81 MG tablet Take 1 tablet (81 mg total) by mouth daily. 30 tablet 3  . Blood Glucose Monitoring Suppl (TRUE METRIX METER) DEVI 1 each by Does not apply route 3 (three) times daily. 1 Device 0  . glucose blood (TRUE METRIX BLOOD GLUCOSE TEST) test strip Use as instructed 100 each 5  . Insulin Syringe-Needle U-100 (BD INSULIN SYRINGE ULTRAFINE) 31G X 15/64" 1 ML MISC As directed 100 each 0  . OVER THE COUNTER MEDICATION Take 1 tablet by mouth daily as needed (edema or swelling.). OTC diuretic.    Marland Kitchen TRUEPLUS LANCETS 30G MISC 1 each by Does not apply route 3 (three) times daily. 100 each 5  . escitalopram (LEXAPRO) 20 MG tablet Take 1 tablet (20 mg total) by mouth daily. 30 tablet 3  . Insulin Glargine (LANTUS SOLOSTAR) 100 UNIT/ML Solostar Pen Inject 70 Units into the skin 2 (two) times daily. 40 mL 3  . insulin lispro (HUMALOG)  100 UNIT/ML injection Inject 0.12 mLs (12 Units total) into the skin 3 (three) times daily with meals. (Patient taking differently: Inject 0-12 Units into the skin 3 (three) times daily with meals. ) 10 mL 3  . lisinopril (PRINIVIL,ZESTRIL) 5 MG tablet Take 1 tablet (5 mg total) by mouth daily. 90 tablet 3  . lovastatin (MEVACOR) 40 MG tablet Take 1 tablet (40 mg total) by mouth at bedtime. 30 tablet 3  . metoprolol succinate (TOPROL-XL) 25 MG 24 hr tablet Take 1 tablet (25 mg total) by mouth daily. 30 tablet 3  . nitroGLYCERIN (NITROSTAT) 0.4 MG SL tablet Place 1 tablet (0.4 mg total) under the tongue every 5 (five) minutes as needed for  chest pain. 50 tablet 3  . prasugrel (EFFIENT) 10 MG TABS tablet Take 1 tablet (10 mg total) by mouth daily. 30 tablet 3  . cephALEXin (KEFLEX) 500 MG capsule Take 1 capsule (500 mg total) by mouth 2 (two) times daily. 14 capsule 0   No facility-administered medications prior to visit.     ROS Review of Systems Constitutional: Negative for activity change, appetite change and fatigue.  HENT: Negative for congestion, sinus pressure and sore throat.   Eyes: Negative for visual disturbance.  Respiratory: Negative for cough, chest tightness, shortness of breath and wheezing.   Cardiovascular: Positive for occasional chest pain  Gastrointestinal: Negative for abdominal distention, abdominal pain and constipation.  Endocrine: Negative for polydipsia.  Genitourinary: Negative for dysuria and frequency.  Musculoskeletal: Negative for arthralgias and back pain.  Skin: Negative for rash.  Neurological: Negative for tremors, light-headedness and numbness.  Hematological: Does not bruise/bleed easily.  Psychiatric/Behavioral: Positive for dysphoric mood. Negative for agitation, behavioral problems and suicidal ideas.  Objective:  BP 133/82 (BP Location: Right Arm, Patient Position: Sitting, Cuff Size: Large)   Pulse 86   Temp 98.3 F (36.8 C) (Oral)   Ht 5\' 2"  (1.575 m)   Wt 270 lb 6.4 oz (122.7 kg)   SpO2 95%   BMI 49.46 kg/m   BP/Weight 08/16/2016 05/31/2016 [id number]  Systolic BP Q000111Q [id number]  Diastolic BP 82 57 87  Wt. (Lbs) 270.4 290 268.8  BMI 49.46 53.04 49.16      Physical Exam Constitutional: She is oriented to person, place, and time. She appears well-developed and well-nourished. No distress.  HENT:  Head: Normocephalic.  Right Ear: External ear normal.  Left Ear: External ear normal.  Nose: Nose normal.  Mouth/Throat: Oropharynx is clear and moist.  Eyes: Conjunctivae and EOM are normal. Pupils are equal, round, and reactive to light.  Neck: Normal range of motion.  No JVD present.  Cardiovascular: Normal rate, regular rhythm, normal heart sounds and intact distal pulses.  Exam reveals no gallop.   No murmur heard. Pulmonary/Chest: Effort normal and breath sounds normal. No respiratory distress. She has no wheezes. She has no rales. She exhibits no tenderness.  Abdominal: Soft. Bowel sounds are normal. She exhibits no distension and no mass. There is no tenderness.  Musculoskeletal: Normal range of motion. She exhibits no edema or tenderness.  Neurological: She is alert and oriented to person, place, and time. She has normal reflexes.  Skin: Skin is warm and dry. She is not diaphoretic.  Psychiatric: Depressed  Lab Results  Component Value Date   HGBA1C 10.6 08/16/2016     Assessment & Plan:   1. Uncontrolled type 2 diabetes mellitus with diabetic nephropathy, without long-term current use of insulin (HCC) - Glucose (CBG) - HgB A1c  2. Uncontrolled type 2 diabetes mellitus with chronic kidney disease, unspecified CKD stage, unspecified long term insulin use status (HCC) Uncontrolled with A1c of 10.6 Increase Lantus to 77 units in the morning and continue 70 units in the evening ADA diet - Insulin Glargine (LANTUS SOLOSTAR) 100 UNIT/ML Solostar Pen; Inject subcutaneously twice daily, 77 units in the morning and 70 units in the evening.  Dispense: 40 mL; Refill: 3 - insulin lispro (HUMALOG) 100 UNIT/ML injection; Inject 0-0.12 mLs (0-12 Units total) into the skin 3 (three) times daily with meals.  Dispense: 10 mL; Refill: 5  3. Acute cystitis without hematuria UA positive for UTI - Urinalysis Dipstick - ciprofloxacin (CIPRO) 500 MG tablet; Take 1 tablet (500 mg total) by mouth 2 (two) times daily.  Dispense: 6 tablet; Refill: 0  4. Atherosclerosis of native coronary artery of native heart without angina pectoris Aggressive risk factor modification EKG done today due to intermittent chest pain-normal sinus rhythm, right bundle branch block (EKG  unchanged from previous) Chest pain could also be secondary to underlying stressors and underlying anxiety She may need a referral to cardiology as she has not seen one in a while - prasugrel (EFFIENT) 10 MG TABS tablet; Take 1 tablet (10 mg total) by mouth daily.  Dispense: 30 tablet; Refill: 5 - ECHOCARDIOGRAM COMPLETE; Future  5. Essential hypertension Controlled - metoprolol succinate (TOPROL-XL) 25 MG 24 hr tablet; Take 1 tablet (25 mg total) by mouth daily.  Dispense: 30 tablet; Refill: 5 - lisinopril (PRINIVIL,ZESTRIL) 5 MG tablet; Take 1 tablet (5 mg total) by mouth daily.  Dispense: 30 tablet; Refill: 5  6. Anxiety and depression Uncontrolled due to family stressors and caregiver stress Trazodone added to her regimen LCSW called in for psychotherapy - escitalopram (LEXAPRO) 20 MG tablet; Take 1 tablet (20 mg total) by mouth daily.  Dispense: 30 tablet; Refill: 5 - traZODone (DESYREL) 50 MG tablet; Take 1 tablet (50 mg total) by mouth at bedtime.  Dispense: 30 tablet; Refill: 5   Meds ordered this encounter  Medications  . Insulin Glargine (LANTUS SOLOSTAR) 100 UNIT/ML Solostar Pen    Sig: Inject subcutaneously twice daily, 77 units in the morning and 70 units in the evening.    Dispense:  40 mL    Refill:  3  . prasugrel (EFFIENT) 10 MG TABS tablet    Sig: Take 1 tablet (10 mg total) by mouth daily.    Dispense:  30 tablet    Refill:  5  . nitroGLYCERIN (NITROSTAT) 0.4 MG SL tablet    Sig: Place 1 tablet (0.4 mg total) under the tongue every 5 (five) minutes as needed for chest pain.    Dispense:  30 tablet    Refill:  5  . metoprolol succinate (TOPROL-XL) 25 MG 24 hr tablet    Sig: Take 1 tablet (25 mg total) by mouth daily.    Dispense:  30 tablet    Refill:  5  . escitalopram (LEXAPRO) 20 MG tablet    Sig: Take 1 tablet (20 mg total) by mouth daily.    Dispense:  30 tablet    Refill:  5  . insulin lispro (HUMALOG) 100 UNIT/ML injection    Sig: Inject 0-0.12 mLs  (0-12 Units total) into the skin 3 (three) times daily with meals.    Dispense:  10 mL    Refill:  5  . lisinopril (PRINIVIL,ZESTRIL) 5 MG tablet    Sig: Take 1 tablet (5 mg total) by mouth daily.  Dispense:  30 tablet    Refill:  5  . traZODone (DESYREL) 50 MG tablet    Sig: Take 1 tablet (50 mg total) by mouth at bedtime.    Dispense:  30 tablet    Refill:  5  . ciprofloxacin (CIPRO) 500 MG tablet    Sig: Take 1 tablet (500 mg total) by mouth 2 (two) times daily.    Dispense:  6 tablet    Refill:  0    Follow-up: Return in about 1 month (around 09/16/2016) for Complete Physical exam.   Total encounter time: 45 minutes and greater than 50% of time spent discussing management of diabetes, treatment of depression and anxiety along with psychotherapy and cognitive behavioral therapy.  Arnoldo Morale MD

## 2016-08-16 NOTE — BH Specialist Note (Signed)
Session Start time: 12:30 pm   End Time: 1:00 pm Total Time:  30 minutes Type of Service: [place] Interpreter: No.   Interpreter Name & Language: N/A # [facility] Visits July 2017-June 2018: 1st   SUBJECTIVE: Brenda [name] is a 55 y.o. female  Pt. was referred by Dr. Jarold Song for:  anxiety and depression. Pt. reports the following symptoms/concerns: nervousness, feelings of sadness, low motivation, and increased appetite Duration of problem:  Pt was diagnosed with Depression and Anxiety in 2010 after separation from spouse. Increase of symptoms occurred six months ago after sister was hospitalized and obtaining custody of minor nephew Severity: moderate Previous treatment: Pt received therapy two years ago; however, lost insurance and could not afford services.    OBJECTIVE: Mood: Anxious & Affect: Appropriate Risk of harm to self or others: Pt denied SI/HI Assessments administered: PHQ-9; GAD-7  LIFE CONTEXT:  Family & Social: Pt is separated from spouse. She has two adult children who provide emotional support in the care of pt's minor nephew. [name] sister is placed in a nursing facility School/ Work: Pt is unemployed. She receives $430 from separated spouse, food stamps, and medicaid for nephew Self-Care: Pt has difficulty sleeping and increased appetite to cope with stress. Pt denies substance use Life changes: Pt recently obtained custody of minor nephew and assists in caring for sibling who is placed in skilled nursing facility. Pt is separated from spouse and receives limited support What is important to pt/family (values): Family, Spiritual   GOALS ADDRESSED:  Decrease symptoms of depression Decrease symptoms of anxiety  INTERVENTIONS: Solution Focused, Strength-based and Supportive   ASSESSMENT:  Pt currently experiencing depression and anxiety triggered by recently obtaining sole custody of minor nephew. [name] sister is placed in a skilled nursing  facility and pt has limited support. Pt reports nervousness, feelings of sadness, low motivation, and increased appetite. Pt may benefit from psychoeducation, psychotherapy, and medication management. LCSWA educated pt on the cycle of depression and anxiety and discussed healthy coping skills to decrease symptoms. Pt verbalized understanding of how mental and physical health correlates to one another, in addition, to the importance of medication compliance to manage diabetes. Pt is open to re-initiating behavorial health services and was provided community resources for crisis intervention, therapy, and medication management.     PLAN: 1. F/U with behavioral health clinician: Pt was encouraged to contact [place] if symptoms worsen or fail to improve to schedule behavioral appointments at [facility]. 2. Behavioral Health meds: Lexapro and Desyrel 3. Behavioral recommendations: LCSWA recommends that pt apply healthy coping skills discussed and initiate behavioral health services. Pt is encouraged to schedule follow up appointment with LCSWA 4. Referral: Brief Counseling/Psychotherapy, [employer], Problem-solving teaching/coping strategies, Psychoeducation and Supportive Counseling 5. From scale of 1-10, how likely are you to follow plan: 7/10   Rebekah Chesterfield, MSW, [place] Worker 08/16/16 5:16 pm  Warmhandoff:   Warm Hand Off Completed.

## 2016-08-16 NOTE — Progress Notes (Signed)
lexapro seems to be helping with her anxiety however she is crying today and states she "sometimes not taking shots- self destructive at times"  Would like imdur prescribed again because of chest pains

## 2016-08-20 ENCOUNTER — Ambulatory Visit (HOSPITAL_COMMUNITY)
Admission: RE | Admit: 2016-08-20 | Discharge: 2016-08-20 | Disposition: A | Discharge status: Home / Self Care | Payer: Medicaid Other | Source: Ambulatory Visit | Attending: Family Medicine | Admitting: Family Medicine

## 2016-08-20 DIAGNOSIS — I2510 Atherosclerotic heart disease of native coronary artery without angina pectoris: Secondary | ICD-10-CM | POA: Insufficient documentation

## 2016-08-20 DIAGNOSIS — I340 Nonrheumatic mitral (valve) insufficiency: Secondary | ICD-10-CM | POA: Insufficient documentation

## 2016-08-20 NOTE — Progress Notes (Signed)
  Echocardiogram 2D Echocardiogram has been performed.  Brenda [name] M 08/20/2016, 8:36 AM

## 2016-08-22 ENCOUNTER — Telehealth: Payer: Self-pay

## 2016-08-22 NOTE — Telephone Encounter (Signed)
-----   Message from Arnoldo Morale, MD sent at 08/21/2016  2:28 PM EST ----- 2-D echo reveals well-preserved ejection fraction, slight tricuspid regurgitation (leak) which has been interpreted as not significant

## 2016-08-22 NOTE — Telephone Encounter (Signed)
Writer called patient but was unable to LVM.  Writer will call back at another time to discuss echo results.

## 2016-08-24 NOTE — Progress Notes (Signed)
After several attempts of trying to reach patient by phone writer sent a letter to her with her Echo results.

## 2016-10-16 MED FILL — !HUMALOG 100 UNITS/ML VIAL: 100 | 27 days supply | Qty: 10 | Fill #1

## 2016-10-19 ENCOUNTER — Other Ambulatory Visit: Payer: Self-pay | Admitting: Family Medicine

## 2016-10-19 DIAGNOSIS — E1129 Type 2 diabetes mellitus with other diabetic kidney complication: Secondary | ICD-10-CM

## 2016-10-19 DIAGNOSIS — IMO0002 Reserved for concepts with insufficient information to code with codable children: Secondary | ICD-10-CM

## 2016-10-19 DIAGNOSIS — Z794 Long term (current) use of insulin: Principal | ICD-10-CM

## 2016-10-19 DIAGNOSIS — E1165 Type 2 diabetes mellitus with hyperglycemia: Principal | ICD-10-CM

## 2016-10-19 MED ORDER — INSULIN GLARGINE 100 UNIT/ML ~~LOC~~ SOLN: SUBCUTANEOUS | 6 refills | Status: DC

## 2016-10-19 MED ORDER — "INSULIN SYRINGE-NEEDLE U-100 31G X 15/64"" 1 ML MISC": 11 refills | Status: DC

## 2016-10-19 MED FILL — !LANTUS 100 UNITS/ML VIAL: 100 | 28 days supply | Qty: 50 | Fill #0

## 2016-10-23 ENCOUNTER — Other Ambulatory Visit: Payer: Self-pay | Admitting: Pharmacist

## 2016-10-23 MED ORDER — INSULIN GLARGINE 100 UNIT/ML ~~LOC~~ SOLN: SUBCUTANEOUS | 1 refills | Status: DC

## 2016-10-26 ENCOUNTER — Other Ambulatory Visit: Payer: Self-pay | Admitting: Pharmacist

## 2016-10-26 MED ORDER — INSULIN DETEMIR 100 UNIT/ML FLEXPEN: PEN_INJECTOR | SUBCUTANEOUS | 2 refills | Status: DC

## 2016-12-10 ENCOUNTER — Telehealth: Payer: Self-pay

## 2016-12-10 NOTE — Telephone Encounter (Signed)
Called to find out if pt would like to have colonoscopy scheduled 

## 2019-10-26 ENCOUNTER — Inpatient Hospital Stay (HOSPITAL_COMMUNITY)
Admission: EM | Admit: 2019-10-26 | Discharge: 2019-10-28 | DRG: 247 | Disposition: A | Discharge status: Home / Self Care | Payer: Self-pay | Attending: Cardiology | Admitting: Cardiology

## 2019-10-26 ENCOUNTER — Other Ambulatory Visit: Payer: Self-pay

## 2019-10-26 ENCOUNTER — Inpatient Hospital Stay (HOSPITAL_COMMUNITY): Payer: Self-pay

## 2019-10-26 ENCOUNTER — Emergency Department (HOSPITAL_COMMUNITY): Payer: Self-pay

## 2019-10-26 ENCOUNTER — Inpatient Hospital Stay (HOSPITAL_COMMUNITY)
Admission: EM | Disposition: A | Discharge status: Home / Self Care | Payer: Self-pay | Source: Home / Self Care | Attending: Cardiology

## 2019-10-26 DIAGNOSIS — E66811 Obesity, class 1: Secondary | ICD-10-CM | POA: Diagnosis present

## 2019-10-26 DIAGNOSIS — Z888 Allergy status to other drugs, medicaments and biological substances status: Secondary | ICD-10-CM

## 2019-10-26 DIAGNOSIS — Z7982 Long term (current) use of aspirin: Secondary | ICD-10-CM

## 2019-10-26 DIAGNOSIS — Z87891 Personal history of nicotine dependence: Secondary | ICD-10-CM

## 2019-10-26 DIAGNOSIS — Z955 Presence of coronary angioplasty implant and graft: Secondary | ICD-10-CM

## 2019-10-26 DIAGNOSIS — I160 Hypertensive urgency: Secondary | ICD-10-CM | POA: Diagnosis present

## 2019-10-26 DIAGNOSIS — Z8249 Family history of ischemic heart disease and other diseases of the circulatory system: Secondary | ICD-10-CM

## 2019-10-26 DIAGNOSIS — M160 Bilateral primary osteoarthritis of hip: Secondary | ICD-10-CM | POA: Diagnosis present

## 2019-10-26 DIAGNOSIS — R079 Chest pain, unspecified: Secondary | ICD-10-CM

## 2019-10-26 DIAGNOSIS — E1165 Type 2 diabetes mellitus with hyperglycemia: Secondary | ICD-10-CM

## 2019-10-26 DIAGNOSIS — I509 Heart failure, unspecified: Secondary | ICD-10-CM | POA: Diagnosis present

## 2019-10-26 DIAGNOSIS — Z9114 Patient's other noncompliance with medication regimen: Secondary | ICD-10-CM

## 2019-10-26 DIAGNOSIS — I2102 ST elevation (STEMI) myocardial infarction involving left anterior descending coronary artery: Principal | ICD-10-CM | POA: Diagnosis present

## 2019-10-26 DIAGNOSIS — F329 Major depressive disorder, single episode, unspecified: Secondary | ICD-10-CM | POA: Diagnosis present

## 2019-10-26 DIAGNOSIS — I2510 Atherosclerotic heart disease of native coronary artery without angina pectoris: Secondary | ICD-10-CM

## 2019-10-26 DIAGNOSIS — Z79899 Other long term (current) drug therapy: Secondary | ICD-10-CM

## 2019-10-26 DIAGNOSIS — I4510 Unspecified right bundle-branch block: Secondary | ICD-10-CM | POA: Diagnosis present

## 2019-10-26 DIAGNOSIS — Z833 Family history of diabetes mellitus: Secondary | ICD-10-CM

## 2019-10-26 DIAGNOSIS — E1129 Type 2 diabetes mellitus with other diabetic kidney complication: Secondary | ICD-10-CM | POA: Diagnosis present

## 2019-10-26 DIAGNOSIS — E118 Type 2 diabetes mellitus with unspecified complications: Secondary | ICD-10-CM

## 2019-10-26 DIAGNOSIS — G4730 Sleep apnea, unspecified: Secondary | ICD-10-CM

## 2019-10-26 DIAGNOSIS — M19042 Primary osteoarthritis, left hand: Secondary | ICD-10-CM | POA: Diagnosis present

## 2019-10-26 DIAGNOSIS — E1169 Type 2 diabetes mellitus with other specified complication: Secondary | ICD-10-CM | POA: Diagnosis present

## 2019-10-26 DIAGNOSIS — G4733 Obstructive sleep apnea (adult) (pediatric): Secondary | ICD-10-CM | POA: Diagnosis present

## 2019-10-26 DIAGNOSIS — Z23 Encounter for immunization: Secondary | ICD-10-CM

## 2019-10-26 DIAGNOSIS — Z794 Long term (current) use of insulin: Secondary | ICD-10-CM

## 2019-10-26 DIAGNOSIS — I249 Acute ischemic heart disease, unspecified: Secondary | ICD-10-CM | POA: Diagnosis present

## 2019-10-26 DIAGNOSIS — Z9049 Acquired absence of other specified parts of digestive tract: Secondary | ICD-10-CM

## 2019-10-26 DIAGNOSIS — I252 Old myocardial infarction: Secondary | ICD-10-CM | POA: Diagnosis present

## 2019-10-26 DIAGNOSIS — I10 Essential (primary) hypertension: Secondary | ICD-10-CM | POA: Diagnosis present

## 2019-10-26 DIAGNOSIS — Z20822 Contact with and (suspected) exposure to covid-19: Secondary | ICD-10-CM | POA: Diagnosis present

## 2019-10-26 DIAGNOSIS — Z885 Allergy status to narcotic agent status: Secondary | ICD-10-CM

## 2019-10-26 DIAGNOSIS — I2109 ST elevation (STEMI) myocardial infarction involving other coronary artery of anterior wall: Secondary | ICD-10-CM | POA: Diagnosis present

## 2019-10-26 DIAGNOSIS — Z825 Family history of asthma and other chronic lower respiratory diseases: Secondary | ICD-10-CM

## 2019-10-26 DIAGNOSIS — M479 Spondylosis, unspecified: Secondary | ICD-10-CM | POA: Diagnosis present

## 2019-10-26 DIAGNOSIS — E785 Hyperlipidemia, unspecified: Secondary | ICD-10-CM | POA: Diagnosis present

## 2019-10-26 DIAGNOSIS — Z9861 Coronary angioplasty status: Secondary | ICD-10-CM

## 2019-10-26 DIAGNOSIS — E7800 Pure hypercholesterolemia, unspecified: Secondary | ICD-10-CM | POA: Diagnosis present

## 2019-10-26 DIAGNOSIS — M19041 Primary osteoarthritis, right hand: Secondary | ICD-10-CM | POA: Diagnosis present

## 2019-10-26 DIAGNOSIS — Z6841 Body Mass Index (BMI) 40.0 and over, adult: Secondary | ICD-10-CM

## 2019-10-26 DIAGNOSIS — I110 Hypertensive heart disease with heart failure: Secondary | ICD-10-CM | POA: Diagnosis present

## 2019-10-26 HISTORY — DX: Morbid (severe) obesity due to excess calories: E66.01

## 2019-10-26 HISTORY — PX: LEFT HEART CATH AND CORONARY ANGIOGRAPHY: CATH118249

## 2019-10-26 HISTORY — PX: CORONARY/GRAFT ACUTE MI REVASCULARIZATION: CATH118305

## 2019-10-26 LAB — TROPONIN I (HIGH SENSITIVITY)
Troponin I (High Sensitivity): 43 ng/L — ABNORMAL HIGH (ref <18)
Troponin I (High Sensitivity): 2032 ng/L (ref <18)

## 2019-10-26 LAB — BASIC METABOLIC PANEL
Anion gap: 13 (ref 5–15)
BUN: 11 mg/dL (ref 6–20)
CO2: 24 mmol/L (ref 22–32)
Calcium: 8.6 mg/dL — ABNORMAL LOW (ref 8.9–10.3)
Chloride: 101 mmol/L (ref 98–111)
Creatinine, Ser: 0.7 mg/dL (ref 0.44–1.00)
GFR calc Af Amer: >60 mL/min (ref >60)
GFR calc non Af Amer: >60 mL/min (ref >60)
Glucose, Bld: 171 mg/dL — ABNORMAL HIGH (ref 70–99)
Potassium: 3.9 mmol/L (ref 3.5–5.1)
Sodium: 138 mmol/L (ref 135–145)

## 2019-10-26 LAB — GLUCOSE, CAPILLARY
Glucose-Capillary: 83 mg/dL (ref 70–99)
Glucose-Capillary: 153 mg/dL — ABNORMAL HIGH (ref 70–99)
Glucose-Capillary: 165 mg/dL — ABNORMAL HIGH (ref 70–99)
Glucose-Capillary: 156 mg/dL — ABNORMAL HIGH (ref 70–99)

## 2019-10-26 LAB — I-STAT CREATININE, ED: Creatinine, Ser: 0.5 mg/dL (ref 0.44–1.00)

## 2019-10-26 LAB — CBC WITH DIFFERENTIAL/PLATELET
Abs Immature Granulocytes: 0.04 10*3/uL (ref 0.00–0.07)
Basophils Absolute: 0 10*3/uL (ref 0.0–0.1)
Basophils Relative: 0 %
Eosinophils Absolute: 0.2 10*3/uL (ref 0.0–0.5)
Eosinophils Relative: 2 %
HCT: 50.9 % — ABNORMAL HIGH (ref 36.0–46.0)
Hemoglobin: 16.4 g/dL — ABNORMAL HIGH (ref 12.0–15.0)
Immature Granulocytes: 0 %
Lymphocytes Relative: 20 %
Lymphs Abs: 1.9 10*3/uL (ref 0.7–4.0)
MCH: 28.7 pg (ref 26.0–34.0)
MCHC: 32.2 g/dL (ref 30.0–36.0)
MCV: 89.1 fL (ref 80.0–100.0)
Monocytes Absolute: 0.5 10*3/uL (ref 0.1–1.0)
Monocytes Relative: 6 %
Neutro Abs: 6.5 10*3/uL (ref 1.7–7.7)
Neutrophils Relative %: 72 %
Platelets: 268 10*3/uL (ref 150–400)
RBC: 5.71 MIL/uL — ABNORMAL HIGH (ref 3.87–5.11)
RDW: 12.8 % (ref 11.5–15.5)
WBC: 9.1 10*3/uL (ref 4.0–10.5)
nRBC: 0 % (ref 0.0–0.2)

## 2019-10-26 LAB — RESPIRATORY PANEL BY RT PCR (FLU A&B, COVID)
Influenza A by PCR: NEGATIVE
Influenza B by PCR: NEGATIVE
SARS Coronavirus 2 by RT PCR: NEGATIVE

## 2019-10-26 LAB — MRSA PCR SCREENING: MRSA by PCR: NEGATIVE

## 2019-10-26 LAB — POCT ACTIVATED CLOTTING TIME: Activated Clotting Time: 389 seconds

## 2019-10-26 LAB — ECHOCARDIOGRAM COMPLETE
Height: 62 in
Weight: 5118.2 oz

## 2019-10-26 SURGERY — CORONARY/GRAFT ACUTE MI REVASCULARIZATION
Anesthesia: LOCAL

## 2019-10-26 MED ORDER — TIROFIBAN HCL IN NACL 5-0.9 MG/100ML-% IV SOLN: 0.1500 ug/kg/min | INTRAVENOUS | Status: AC

## 2019-10-26 MED ORDER — MIDAZOLAM HCL 2 MG/2ML IJ SOLN
INTRAMUSCULAR | Status: DC | PRN
  Administered 2019-10-26: 1 mg via INTRAVENOUS

## 2019-10-26 MED ORDER — INSULIN ASPART 100 UNIT/ML ~~LOC~~ SOLN: 0.0000 [IU] | Freq: Three times a day (TID) | SUBCUTANEOUS | Status: DC

## 2019-10-26 MED ORDER — LIDOCAINE HCL (PF) 1 % IJ SOLN
INTRAMUSCULAR | Status: DC | PRN
  Administered 2019-10-26: 2 mL

## 2019-10-26 MED ORDER — TIROFIBAN HCL IN NACL 5-0.9 MG/100ML-% IV SOLN
INTRAVENOUS | Status: AC | PRN
  Administered 2019-10-26: 0.15 ug/kg/min
  Administered 2019-10-26: 0.15 ug/kg/min via INTRAVENOUS

## 2019-10-26 MED ORDER — HYDRALAZINE HCL 20 MG/ML IJ SOLN: 10.0000 mg | INTRAMUSCULAR | Status: AC | PRN

## 2019-10-26 MED ORDER — INSULIN ASPART 100 UNIT/ML ~~LOC~~ SOLN: 0.0000 [IU] | Freq: Every day | SUBCUTANEOUS | Status: DC

## 2019-10-26 MED ORDER — SODIUM CHLORIDE 0.9% FLUSH
3.0000 mL | Freq: Two times a day (BID) | INTRAVENOUS | Status: DC
  Administered 2019-10-26 – 2019-10-27 (×2): 3 mL via INTRAVENOUS

## 2019-10-26 MED ORDER — INSULIN ASPART 100 UNIT/ML ~~LOC~~ SOLN
0.0000 [IU] | Freq: Every day | SUBCUTANEOUS | Status: DC
  Administered 2019-10-27: 3 [IU] via SUBCUTANEOUS

## 2019-10-26 MED ORDER — ATORVASTATIN CALCIUM 80 MG PO TABS
80.0000 mg | ORAL_TABLET | Freq: Every day | ORAL | Status: DC
  Administered 2019-10-26 – 2019-10-27 (×2): 80 mg via ORAL
  Filled 2019-10-26 (×3): qty 1

## 2019-10-26 MED ORDER — HEPARIN (PORCINE) IN NACL 1000-0.9 UT/500ML-% IV SOLN
INTRAVENOUS | Status: DC | PRN
  Administered 2019-10-26 (×2): 500 mL

## 2019-10-26 MED ORDER — PRASUGREL HCL 10 MG PO TABS
10.0000 mg | ORAL_TABLET | Freq: Every day | ORAL | Status: DC
  Filled 2019-10-26: qty 1

## 2019-10-26 MED ORDER — FENTANYL CITRATE (PF) 100 MCG/2ML IJ SOLN
INTRAMUSCULAR | Status: DC | PRN
  Administered 2019-10-26 (×2): 50 ug via INTRAVENOUS

## 2019-10-26 MED ORDER — MIDAZOLAM HCL 2 MG/2ML IJ SOLN
INTRAMUSCULAR | Status: AC
  Filled 2019-10-26: qty 2

## 2019-10-26 MED ORDER — FENTANYL CITRATE (PF) 100 MCG/2ML IJ SOLN
INTRAMUSCULAR | Status: AC
  Filled 2019-10-26: qty 2

## 2019-10-26 MED ORDER — SODIUM CHLORIDE 0.9% FLUSH: 3.0000 mL | INTRAVENOUS | Status: DC | PRN

## 2019-10-26 MED ORDER — PRASUGREL HCL 10 MG PO TABS
ORAL_TABLET | ORAL | Status: AC
  Filled 2019-10-26: qty 5

## 2019-10-26 MED ORDER — LABETALOL HCL 5 MG/ML IV SOLN: 10.0000 mg | INTRAVENOUS | Status: AC | PRN

## 2019-10-26 MED ORDER — LIDOCAINE HCL (PF) 1 % IJ SOLN
INTRAMUSCULAR | Status: AC
  Filled 2019-10-26: qty 30

## 2019-10-26 MED ORDER — VERAPAMIL HCL 2.5 MG/ML IV SOLN
INTRAVENOUS | Status: DC | PRN
  Administered 2019-10-26: 12:00:00 10 mL via INTRA_ARTERIAL

## 2019-10-26 MED ORDER — VERAPAMIL HCL 2.5 MG/ML IV SOLN
INTRAVENOUS | Status: AC
  Filled 2019-10-26: qty 2

## 2019-10-26 MED ORDER — INFLUENZA VAC SPLIT QUAD 0.5 ML IM SUSY
0.5000 mL | PREFILLED_SYRINGE | INTRAMUSCULAR | Status: AC
  Administered 2019-10-27: 0.5 mL via INTRAMUSCULAR
  Filled 2019-10-26: qty 0.5

## 2019-10-26 MED ORDER — TIROFIBAN HCL IN NACL 5-0.9 MG/100ML-% IV SOLN
INTRAVENOUS | Status: AC
  Filled 2019-10-26: qty 100

## 2019-10-26 MED ORDER — ASPIRIN EC 81 MG PO TBEC
81.0000 mg | DELAYED_RELEASE_TABLET | Freq: Every day | ORAL | Status: DC
  Administered 2019-10-27 – 2019-10-28 (×2): 81 mg via ORAL
  Filled 2019-10-26 (×2): qty 1

## 2019-10-26 MED ORDER — ACETAMINOPHEN 325 MG PO TABS
650.0000 mg | ORAL_TABLET | ORAL | Status: DC | PRN
  Administered 2019-10-27 – 2019-10-28 (×2): 650 mg via ORAL
  Filled 2019-10-26 (×2): qty 2

## 2019-10-26 MED ORDER — INSULIN NPH (HUMAN) (ISOPHANE) 100 UNIT/ML ~~LOC~~ SUSP
35.0000 [IU] | Freq: Two times a day (BID) | SUBCUTANEOUS | Status: DC
  Administered 2019-10-26: 35 [IU] via SUBCUTANEOUS
  Filled 2019-10-26: qty 10

## 2019-10-26 MED ORDER — TIROFIBAN (AGGRASTAT) BOLUS VIA INFUSION
INTRAVENOUS | Status: DC | PRN
  Administered 2019-10-26: 12:00:00 3968.975 ug via INTRAVENOUS

## 2019-10-26 MED ORDER — IOHEXOL 350 MG/ML SOLN
100.0000 mL | Freq: Once | INTRAVENOUS | Status: AC | PRN
  Administered 2019-10-26: 100 mL via INTRAVENOUS

## 2019-10-26 MED ORDER — HEPARIN SODIUM (PORCINE) 1000 UNIT/ML IJ SOLN
INTRAMUSCULAR | Status: DC | PRN
  Administered 2019-10-26: 10000 [IU] via INTRAVENOUS
  Administered 2019-10-26: 5000 [IU] via INTRAVENOUS

## 2019-10-26 MED ORDER — SODIUM CHLORIDE 0.9 % IV SOLN: 250.0000 mL | INTRAVENOUS | Status: DC | PRN

## 2019-10-26 MED ORDER — HEPARIN SODIUM (PORCINE) 1000 UNIT/ML IJ SOLN
INTRAMUSCULAR | Status: AC
  Filled 2019-10-26: qty 1

## 2019-10-26 MED ORDER — NITROGLYCERIN 0.4 MG SL SUBL
0.4000 mg | SUBLINGUAL_TABLET | SUBLINGUAL | Status: DC | PRN
Start: 2019-10-26 — End: 2019-10-28

## 2019-10-26 MED ORDER — IOHEXOL 350 MG/ML SOLN
INTRAVENOUS | Status: DC | PRN
  Administered 2019-10-26: 12:00:00 120 mL

## 2019-10-26 MED ORDER — HEPARIN (PORCINE) IN NACL 1000-0.9 UT/500ML-% IV SOLN
INTRAVENOUS | Status: AC
  Filled 2019-10-26: qty 1000

## 2019-10-26 MED ORDER — PRASUGREL HCL 10 MG PO TABS
ORAL_TABLET | ORAL | Status: AC
  Filled 2019-10-26: qty 1

## 2019-10-26 MED ORDER — ONDANSETRON HCL 4 MG/2ML IJ SOLN
4.0000 mg | Freq: Four times a day (QID) | INTRAMUSCULAR | Status: DC | PRN
  Administered 2019-10-26: 4 mg via INTRAVENOUS

## 2019-10-26 MED ORDER — METOPROLOL TARTRATE 25 MG PO TABS
25.0000 mg | ORAL_TABLET | Freq: Two times a day (BID) | ORAL | Status: DC
  Administered 2019-10-26 – 2019-10-28 (×5): 25 mg via ORAL
  Filled 2019-10-26 (×3): qty 2
  Filled 2019-10-26 (×2): qty 1

## 2019-10-26 MED ORDER — SODIUM CHLORIDE 0.9 % IV SOLN: INTRAVENOUS | Status: AC

## 2019-10-26 MED ORDER — PRASUGREL HCL 10 MG PO TABS
ORAL_TABLET | ORAL | Status: DC | PRN
  Administered 2019-10-26: 60 mg via ORAL

## 2019-10-26 MED ORDER — INSULIN ASPART 100 UNIT/ML ~~LOC~~ SOLN
0.0000 [IU] | Freq: Three times a day (TID) | SUBCUTANEOUS | Status: DC
  Administered 2019-10-26: 4 [IU] via SUBCUTANEOUS
  Administered 2019-10-27: 11 [IU] via SUBCUTANEOUS
  Administered 2019-10-27: 20 [IU] via SUBCUTANEOUS
  Administered 2019-10-27: 4 [IU] via SUBCUTANEOUS
  Administered 2019-10-28: 7 [IU] via SUBCUTANEOUS

## 2019-10-26 MED ORDER — PERFLUTREN LIPID MICROSPHERE
1.0000 mL | INTRAVENOUS | Status: AC | PRN
  Administered 2019-10-26: 2 mL via INTRAVENOUS
  Filled 2019-10-26: qty 10

## 2019-10-26 MED ORDER — PNEUMOCOCCAL VAC POLYVALENT 25 MCG/0.5ML IJ INJ
0.5000 mL | INJECTION | INTRAMUSCULAR | Status: AC
  Administered 2019-10-27: 0.5 mL via INTRAMUSCULAR
  Filled 2019-10-26: qty 0.5

## 2019-10-26 SURGICAL SUPPLY — 20 items
BALLN SAPPHIRE 2.0X12 (BALLOONS) ×2
BALLN ~~LOC~~ EMERGE MR 2.75X12 (BALLOONS) ×2
BALLOON SAPPHIRE 2.0X12 (BALLOONS) ×1 IMPLANT
BALLOON ~~LOC~~ EMERGE MR 2.75X12 (BALLOONS) ×1 IMPLANT
CATH 5FR JL3.5 JR4 ANG PIG MP (CATHETERS) ×2 IMPLANT
CATH TELESCOPE 6F GEC (CATHETERS) ×2 IMPLANT
CATH VISTA GUIDE 6FR XBLAD3.5 (CATHETERS) ×2 IMPLANT
DEVICE RAD COMP TR BAND LRG (VASCULAR PRODUCTS) ×2 IMPLANT
GLIDESHEATH SLEND SS 6F .021 (SHEATH) ×2 IMPLANT
GUIDEWIRE INQWIRE 1.5J.035X260 (WIRE) ×1 IMPLANT
HOVERMATT SINGLE USE (MISCELLANEOUS) ×2 IMPLANT
INQWIRE 1.5J .035X260CM (WIRE) ×2
KIT ENCORE 26 ADVANTAGE (KITS) ×2 IMPLANT
KIT HEART LEFT (KITS) ×2 IMPLANT
PACK CARDIAC CATHETERIZATION (CUSTOM PROCEDURE TRAY) ×2 IMPLANT
STENT SYNERGY XD 2.50X28 (Permanent Stent) ×1 IMPLANT
SYNERGY XD 2.50X28 (Permanent Stent) ×2 IMPLANT
TRANSDUCER W/STOPCOCK (MISCELLANEOUS) ×2 IMPLANT
TUBING CIL FLEX 10 FLL-RA (TUBING) ×2 IMPLANT
WIRE COUGAR XT STRL 190CM (WIRE) ×2 IMPLANT

## 2019-10-26 NOTE — ED Provider Notes (Signed)
[facility] CATH LAB Provider Note   CSN: [id number] Arrival date & time: 10/26/19  1035     History No chief complaint on file.   Brenda [name] is a 59 y.o. female.  59 yo F with a chief complaint of chest pain that radiates to the center of her back.  This is been off and on for the past couple days but significantly worse this morning.  Had some shortness of breath and some diaphoresis and some nausea.  Ended up calling 911.  EKG was transmitted to our facility and was evaluated by the cardiologist with some concern for a ST elevation MI.  She was evaluated by them on arrival and there is some concern for possible dissection with a pain rating to the back.  Level 5 caveat acuity of condition.  The history is provided by the patient.  Illness Severity:  Severe Onset quality:  Gradual Duration:  2 days Timing:  Constant Progression:  Worsening Chronicity:  New Associated symptoms: chest pain   Associated symptoms: no congestion, no fever, no headaches, no myalgias, no nausea, no rhinorrhea, no shortness of breath, no vomiting and no wheezing        Past Medical History:  Diagnosis Date  . Arthritis    "back, hands, hips" (02/22/2015)  . CAD (coronary artery disease)    s/p nstemi 05/2009 treated w drug-eluting stents  . CHF (congestive heart failure) ([place])   . Depression   . Diabetes mellitus ([place])    "started when I was pregnant; not sure if it was type 1 or type 2"   . Heart attack 2010  . Hypercholesterolemia   . Hypertension   . Obesity   . Sleep apnea    "suppose to wear mask; I don't" (02/22/2015)    Patient Active Problem List   Diagnosis Date Noted  . Acute ST elevation myocardial infarction (STEMI) of anterolateral wall ([place]) 10/26/2019  . Anxiety and depression 03/28/2016  . DM type 2, uncontrolled, with renal complications ([place]) [id number]  . Morbid obesity ([place]) 02/22/2015  . Hyperglycemia 02/22/2015  . Dyspnea 03/11/2013  .  Hyperlipidemia associated with type 2 diabetes mellitus ([place]) 05/25/2009  . CAD S/P PCI of LAD 05/25/2009  . Essential hypertension 05/24/2009  . Sleep apnea 05/24/2009    Past Surgical History:  Procedure Laterality Date  . CARDIAC CATHETERIZATION  12/2012  . CHOLECYSTECTOMY OPEN  1989  . CORONARY ANGIOPLASTY WITH STENT PLACEMENT  2011  . HERNIA REPAIR    . LEFT HEART CATHETERIZATION WITH CORONARY ANGIOGRAM N/A 12/25/2012   Procedure: LEFT HEART CATHETERIZATION WITH CORONARY ANGIOGRAM;  Surgeon: Sherren Mocha, MD;  Location: [facility] CATH LAB;  Service: Cardiovascular;  Laterality: N/A;  . UMBILICAL HERNIA REPAIR  1990's     OB History   No obstetric history on file.     Family History  Problem Relation Age of Onset  . Heart attack Father        in his mid 77s  . COPD Father   . Coronary artery disease Mother        in her mid 15s  . Hypertension Mother   . Cancer Mother   . Diabetes Sister   . Coronary artery disease Sister        valve replacement  . Stroke Sister        multiple    Social History   Tobacco Use  . Smoking status: Former Smoker    Packs/day: 0.50  Years: 1.00    Pack years: 0.50    Types: Cigarettes, Cigars    Quit date: 09/03/1986    Years since quitting: 33.1  . Smokeless tobacco: Never Used  Substance Use Topics  . Alcohol use: No  . Drug use: No    Home Medications Prior to Admission medications   Medication Sig Start Date End Date Taking? Authorizing Provider  aspirin EC 81 MG tablet Take 1 tablet (81 mg total) by mouth daily. 03/28/16   Charlott Rakes, MD  Blood Glucose Monitoring Suppl (TRUE METRIX METER) DEVI 1 each by Does not apply route 3 (three) times daily. 02/28/15   Charlott Rakes, MD  ciprofloxacin (CIPRO) 500 MG tablet Take 1 tablet (500 mg total) by mouth 2 (two) times daily. 08/16/16   Charlott Rakes, MD  escitalopram (LEXAPRO) 20 MG tablet Take 1 tablet (20 mg total) by mouth daily. 08/16/16   Charlott Rakes, MD  glucose  blood (TRUE METRIX BLOOD GLUCOSE TEST) test strip Use as instructed 02/28/15   Charlott Rakes, MD  Insulin Detemir (LEVEMIR FLEXPEN) 100 UNIT/ML Pen Inject into the skin subcutaneously 77 units in the morning and 70 units in the evening 10/26/16   Charlott Rakes, MD  insulin glargine (LANTUS) 100 UNIT/ML injection Inject into the skin subcutaneously 77 units in the morning and 70 units in the evening 10/23/16   Charlott Rakes, MD  insulin lispro (HUMALOG) 100 UNIT/ML injection Inject 0-0.12 mLs (0-12 Units total) into the skin 3 (three) times daily with meals. 08/16/16   Charlott Rakes, MD  Insulin Syringe-Needle U-100 (BD INSULIN SYRINGE ULTRAFINE) 31G X 15/64" 1 ML MISC As directed twice a day with Lantus and 3 times a day with Humalog 10/19/16   Charlott Rakes, MD  isosorbide mononitrate (IMDUR) 30 MG 24 hr tablet Take 30 mg by mouth daily.    [provider]  lisinopril (PRINIVIL,ZESTRIL) 5 MG tablet Take 1 tablet (5 mg total) by mouth daily. 08/16/16   Charlott Rakes, MD  metoprolol succinate (TOPROL-XL) 25 MG 24 hr tablet Take 1 tablet (25 mg total) by mouth daily. 08/16/16   Charlott Rakes, MD  nitroGLYCERIN (NITROSTAT) 0.4 MG SL tablet Place 1 tablet (0.4 mg total) under the tongue every 5 (five) minutes as needed for chest pain. 08/16/16   Charlott Rakes, MD  OVER THE COUNTER MEDICATION Take 1 tablet by mouth daily as needed (edema or swelling.). OTC diuretic.    [provider]  prasugrel (EFFIENT) 10 MG TABS tablet Take 1 tablet (10 mg total) by mouth daily. 08/16/16   Charlott Rakes, MD  traZODone (DESYREL) 50 MG tablet Take 1 tablet (50 mg total) by mouth at bedtime. 08/16/16   Charlott Rakes, MD  TRUEPLUS LANCETS 30G MISC 1 each by Does not apply route 3 (three) times daily. 02/28/15   Charlott Rakes, MD    Allergies    Hydrocodone, Clopidogrel bisulfate, and Hydrocodeine [dihydrocodeine]  Review of Systems   Review of Systems  Constitutional: Negative for  chills and fever.  HENT: Negative for congestion and rhinorrhea.   Eyes: Negative for redness and visual disturbance.  Respiratory: Negative for shortness of breath and wheezing.   Cardiovascular: Positive for chest pain. Negative for palpitations.  Gastrointestinal: Negative for nausea and vomiting.  Genitourinary: Negative for dysuria and urgency.  Musculoskeletal: Negative for arthralgias and myalgias.  Skin: Negative for pallor and wound.  Neurological: Negative for dizziness and headaches.    Physical Exam Updated Vital Signs SpO2 93%   Physical  Exam Vitals and nursing note reviewed.  Constitutional:      General: She is not in acute distress.    Appearance: She is well-developed. She is not diaphoretic.  HENT:     Head: Normocephalic and atraumatic.  Eyes:     Pupils: Pupils are equal, round, and reactive to light.  Cardiovascular:     Rate and Rhythm: Normal rate and regular rhythm.     Pulses: Normal pulses.     Heart sounds: No murmur. No friction rub. No gallop.      Comments: No obvious aortic murmur, radial pulses are 2+ and equal Pulmonary:     Effort: Pulmonary effort is normal.     Breath sounds: No wheezing or rales.  Abdominal:     General: There is no distension.     Palpations: Abdomen is soft.     Tenderness: There is no abdominal tenderness.  Musculoskeletal:        General: No tenderness.     Cervical back: Normal range of motion and neck supple.  Skin:    General: Skin is warm and dry.  Neurological:     Mental Status: She is alert and oriented to person, place, and time.  Psychiatric:        Behavior: Behavior normal.     ED Results / Procedures / Treatments   Labs (all labs ordered are listed, but only abnormal results are displayed) Labs Reviewed  RESPIRATORY PANEL BY RT PCR (FLU A&B, COVID)  CBC WITH DIFFERENTIAL/PLATELET  BASIC METABOLIC PANEL  I-STAT CREATININE, ED  TROPONIN I (HIGH SENSITIVITY)    EKG None  Radiology CT  Angio Chest/Abd/Pel for Dissection W and/or Wo Contrast  Result Date: 10/26/2019 CLINICAL DATA:  Chest pain EXAM: CT ANGIOGRAPHY CHEST, ABDOMEN AND PELVIS TECHNIQUE: Multidetector CT imaging through the chest, abdomen and pelvis was performed using the standard protocol during bolus administration of intravenous contrast. Multiplanar reconstructed images and MIPs were obtained and reviewed to evaluate the vascular anatomy. CONTRAST:  134mL OMNIPAQUE IOHEXOL 350 MG/ML SOLN COMPARISON:  None. FINDINGS: CTA CHEST FINDINGS Cardiovascular: Preferential opacification of the thoracic aorta. No evidence of thoracic aortic aneurysm or dissection. Normal heart size. No pericardial effusion. LAD atherosclerosis. Mediastinum/Nodes: No enlarged mediastinal, hilar, or axillary lymph nodes. Thyroid gland, trachea, and esophagus demonstrate no significant findings. Lungs/Pleura: Lungs are clear. No pleural effusion or pneumothorax. Musculoskeletal: No chest wall abnormality. No acute or significant osseous findings. Review of the MIP images confirms the above findings. CTA ABDOMEN AND PELVIS FINDINGS VASCULAR Aorta: Normal caliber aorta without aneurysm, dissection, vasculitis or significant stenosis. Celiac: Patent without evidence of aneurysm, dissection, vasculitis or significant stenosis. SMA: Patent without evidence of aneurysm, dissection, vasculitis or significant stenosis. Renals: Both renal arteries are patent without evidence of aneurysm, dissection, vasculitis, fibromuscular dysplasia or significant stenosis. IMA: Patent without evidence of aneurysm, dissection, vasculitis or significant stenosis. Inflow: Patent without evidence of aneurysm, dissection, vasculitis or significant stenosis. Veins: No obvious venous abnormality within the limitations of this arterial phase study. Review of the MIP images confirms the above findings. NON-VASCULAR Hepatobiliary: No focal liver abnormality is seen. Status post  cholecystectomy. No biliary dilatation. Pancreas: Unremarkable. No pancreatic ductal dilatation or surrounding inflammatory changes. Spleen: Normal in size without focal abnormality. Adrenals/Urinary Tract: Adrenal glands are unremarkable. Kidneys are normal, without renal calculi, focal lesion, or hydronephrosis. Bladder is unremarkable. Stomach/Bowel: Stomach is within normal limits. Appendix appears normal. No evidence of bowel wall thickening, distention, or inflammatory changes. Lymphatic: No lymphadenopathy  Reproductive: Uterus and bilateral adnexa are unremarkable. Other: Wide-mouth fat containing umbilical hernia. Musculoskeletal: No acute osseous abnormality. No aggressive osseous lesion. Review of the MIP images confirms the above findings. IMPRESSION: 1. No aortic aneurysm or dissection. 2. LAD atherosclerosis. Electronically Signed   By: Kathreen Devoid   On: 10/26/2019 11:16    Procedures Procedures (including critical care time)  Medications Ordered in ED Medications  iohexol (OMNIPAQUE) 350 MG/ML injection 100 mL (100 mLs Intravenous Contrast Given 10/26/19 1103)    ED Course  I have reviewed the triage vital signs and the nursing notes.  Pertinent labs & imaging results that were available during my care of the patient were reviewed by me and considered in my medical decision making (see chart for details).    MDM Rules/Calculators/A&P                      59 yo F with a chief complaint of chest pain that radiates to the back.  EKG was concerning for acute occlusion.  Evaluated by the cardiologist on arrival, there is some concern for a dissection as her pain radiates to the back.  Requesting emergent CT angiogram to evaluate for dissection.  This was negative and the patient was taken to the Cath Lab.  CRITICAL CARE Performed by: Cecilio Asper   Total critical care time: 35 minutes  Critical care time was exclusive of separately billable procedures and treating other  patients.  Critical care was necessary to treat or prevent imminent or life-threatening deterioration.  Critical care was time spent personally by me on the following activities: development of treatment plan with patient and/or surrogate as well as nursing, discussions with consultants, evaluation of patient's response to treatment, examination of patient, obtaining history from patient or surrogate, ordering and performing treatments and interventions, ordering and review of laboratory studies, ordering and review of radiographic studies, pulse oximetry and re-evaluation of patient's condition.  The patients results and plan were reviewed and discussed.   Any x-rays performed were independently reviewed by myself.   Differential diagnosis were considered with the presenting HPI.  Medications  iohexol (OMNIPAQUE) 350 MG/ML injection 100 mL (100 mLs Intravenous Contrast Given 10/26/19 1103)    Vitals:   10/26/19 1124  SpO2: 93%    Final diagnoses:  Chest pain with high risk for cardiac etiology    Admission/ observation were discussed with the admitting physician, patient and/or family and they are comfortable with the plan.   Final Clinical Impression(s) / ED Diagnoses Final diagnoses:  Chest pain with high risk for cardiac etiology    Rx / DC Orders ED Discharge Orders    None       Deno Etienne, DO 10/26/19 1126

## 2019-10-26 NOTE — Progress Notes (Signed)
  Echocardiogram 2D Echocardiogram has been performed.  Brenda [name] Brenda [name] 10/26/2019, 4:51 PM

## 2019-10-26 NOTE — H&P (Signed)
Cardiology Admission History and Physical:   Patient ID: Brenda [name] MRN: [id number]; DOB: 06-04-1961   Admission date: 10/26/2019  Primary Care Provider: Charlott Rakes, MD Primary Cardiologist: No primary care provider on file.  Previously seen by Dr. Burt Knack Primary Electrophysiologist:  None   Chief Complaint: Chest pain radiating to the back  Patient Profile:   Brenda [name] is a 59 y.o. morbidly obese female with longstanding history of CAD-PCI to LAD and D1 back in 2010 along with insulin-dependent diabetes, CPAP with likely OSA who presented via EMS to [facility] with EKG suggestive of STEMI.  Code STEMI was not called until arrival to the ER.  She was noted to be profoundly hypertensive with blood pressures 220/110 by EMS, pressures were better upon arrival to the ER.  History of Present Illness:   Brenda [name] indicates that she has really not been taking any of her medications besides insulin for diabetes over the last couple years and related to lack of insurance.  She notes that she started has chronic nausea and exertional dyspnea as well as sweating.  She has exertional dyspnea at baseline but this has been stable up until this last Friday, February 19 when she began having left-sided substernal chest pain that initially began as a pressure and went away spontaneously, however it then has become more persistent with longer spells and never fully going away.  The pain also then started going from the front to the back radiating "through her chest ".  She had become increasingly anxious and then on the morning of 10/26/2019, she woke up with 10 of 10 sharp pressure-like chest pain radiating to the back associated with shortness of breath and swelling.  She does indicate this is similar to her prior MI pain and therefore presented via EMS to [facility] Emergency Room.   The patient was seen upon arrival to the ER and was still noted to have a left hand pain with minimal  relief after 2 of several nitroglycerin.  She indicated that she has not been on any medications for the last several years and her blood pressures probably be getting higher.  Which she said she had a headache but no blurred vision or dizziness over the last couple days, has had some more just dyspnea than usual but no significant edema or PND.  Although she has had nausea, she denies any vomiting.  Has had no rapid regular heartbeats palpitations.  No syncope/near syncope or TIA/amaurosis fugax, just feeling very worn out over this weekend. She has not been overly compliant with CPAP.  No melena, hematochezia, hematuria, or epistaxis.  Heart Pathway Score:     Past Medical History:  Diagnosis Date  . Arthritis    "back, hands, hips" (02/22/2015)  . CAD (coronary artery disease)    s/p nstemi 05/2009 treated w drug-eluting stents  . CHF (congestive heart failure) ([place])   . Depression   . Diabetes mellitus ([place])    "started when I was pregnant; not sure if it was type 1 or type 2"   . Heart attack 2010  . Hypercholesterolemia   . Hypertension   . Obesity   . Sleep apnea    "suppose to wear mask; I don't" (02/22/2015)    Past Surgical History:  Procedure Laterality Date  . CARDIAC CATHETERIZATION  12/2012  . CHOLECYSTECTOMY OPEN  1989  . CORONARY ANGIOPLASTY WITH STENT PLACEMENT  2011  . HERNIA REPAIR    . LEFT HEART CATHETERIZATION WITH CORONARY  ANGIOGRAM N/A 12/25/2012   Procedure: LEFT HEART CATHETERIZATION WITH CORONARY ANGIOGRAM;  Surgeon: Sherren Mocha, MD;  Location: [facility] CATH LAB;  Service: Cardiovascular;  Laterality: N/A;  . UMBILICAL HERNIA REPAIR  1990's     Medications Prior to Admission: However, per her report, she is not taking anything besides her diabetes medicines.  Not taking blood pressure medications, or Brilinta.  Not taking cholesterol on occasion. Prior to Admission medications -that were supposed to be scheduled  Medication Sig Start Date End Date Taking?  Authorizing Provider  aspirin EC 81 MG tablet Take 1 tablet (81 mg total) by mouth daily. 03/28/16   Charlott Rakes, MD  Blood Glucose Monitoring Suppl (TRUE METRIX METER) DEVI 1 each by Does not apply route 3 (three) times daily. 02/28/15   Charlott Rakes, MD                glucose blood (TRUE METRIX BLOOD GLUCOSE TEST) test strip Use as instructed 02/28/15   Charlott Rakes, MD  Insulin Detemir (LEVEMIR FLEXPEN) 100 UNIT/ML Pen Inject into the skin subcutaneously 77 units in the morning and 70 units in the evening 10/26/16   Charlott Rakes, MD  insulin glargine (LANTUS) 100 UNIT/ML injection Inject into the skin subcutaneously 77 units in the morning and 70 units in the evening 10/23/16   Charlott Rakes, MD  insulin lispro (HUMALOG) 100 UNIT/ML injection Inject 0-0.12 mLs (0-12 Units total) into the skin 3 (three) times daily with meals. 08/16/16   Charlott Rakes, MD  Insulin Syringe-Needle U-100 (BD INSULIN SYRINGE ULTRAFINE) 31G X 15/64" 1 ML MISC As directed twice a day with Lantus and 3 times a day with Humalog 10/19/16   Charlott Rakes, MD  isosorbide mononitrate (IMDUR) 30 MG 24 hr tablet Take 30 mg by mouth daily.    [provider]  lisinopril (PRINIVIL,ZESTRIL) 5 MG tablet Take 1 tablet (5 mg total) by mouth daily. 08/16/16   Charlott Rakes, MD  metoprolol succinate (TOPROL-XL) 25 MG 24 hr tablet Take 1 tablet (25 mg total) by mouth daily. 08/16/16   Charlott Rakes, MD  nitroGLYCERIN (NITROSTAT) 0.4 MG SL tablet Place 1 tablet (0.4 mg total) under the tongue every 5 (five) minutes as needed for chest pain. 08/16/16   Charlott Rakes, MD  OVER THE COUNTER MEDICATION Take 1 tablet by mouth daily as needed (edema or swelling.). OTC diuretic.    [provider]  prasugrel (EFFIENT) 10 MG TABS tablet Take 1 tablet (10 mg total) by mouth daily. 08/16/16   Charlott Rakes, MD  traZODone (DESYREL) 50 MG tablet Take 1 tablet (50 mg total) by mouth at bedtime. 08/16/16   Charlott Rakes, MD  TRUEPLUS LANCETS 30G MISC 1 each by Does not apply route 3 (three) times daily. 02/28/15   Charlott Rakes, MD     Allergies:    Allergies  Allergen Reactions  . Hydrocodone Shortness Of Breath    "I forget to breathe."   . Clopidogrel Bisulfate     REACTION: rash  . Hydrocodeine [Dihydrocodeine]     Social History:   Social History   Tobacco Use  . Smoking status: Former Smoker    Packs/day: 0.50    Years: 1.00    Pack years: 0.50    Types: Cigarettes, Cigars    Quit date: 09/03/1986    Years since quitting: 33.1  . Smokeless tobacco: Never Used  Substance Use Topics  . Alcohol use: No  . Drug use: No   Social History  Social History Narrative  . Not on file     Family History:   The patient's family history includes COPD in her father; Cancer in her mother; Coronary artery disease in her mother and sister; Diabetes in her sister; Heart attack in her father; Hypertension in her mother; Stroke in her sister.    ROS:  Please see the history of present illness.  Review of Systems - Negative except Symptoms noted in HPI and General ROS: positive for  - fatigue, sleep disturbance and Diaphoresis negative for - chills, fever, night sweats or Weight change Psychological ROS: positive for - anxiety ENT ROS: positive for - headaches and No visual changes.  Uses CPAP Endocrine ROS: She has been treating her diabetes only.  Using previously dosed insulin regimen. Respiratory ROS: positive for - cough, orthopnea, shortness of breath and Baseline OSA negative for - hemoptysis or sputum changes Gastrointestinal ROS: no abdominal pain, change in bowel habits, or black or bloody stools   All other ROS reviewed and negative.     Physical Exam/Data:  There were no vitals filed for this visit. No intake or output data in the 24 hours ending 10/26/19 1123 Last 3 Weights 08/16/2016 05/31/2016 03/28/2016  Weight (lbs) 270 lb 6.4 oz 290 lb 268 lb 12.8 oz  Weight (kg)  122.653 kg 131.543 kg 121.927 kg     There is no height or weight on file to calculate BMI.  General: Morbidly obese middle-aged woman in moderate distress noting 8 out of 10 chest pain radiating to the back.  Mildly diaphoretic HEENT: [place]/AT/EOMI. Neck: Unable to assess JVD because of body habitus Endocrine:  No palpable thryomegaly Vascular: No audible carotid bruits;  unable to palpate femoral pulses due to body habitus, however both radial pulses appear to be equal. Cardiac: Distant S1 and S2, but appears to be normal S1 with split S2.  No obvious M/R/G.. Lungs: Distant breath sounds with maybe mild basal crackles but no obvious rales or rhonchi.  Normal respiratory effort Abd: Morbidly obese.  Soft nontender, but unable to assess any HSM  Ext: Slight/trace edema but otherwise no clubbing or cyanosis. Musculoskeletal:  No deformities, BUE and BLE strength normal and equal Skin: warm and mildly diaphoretic Neuro:  CNs 2-12 intact, wearing glasses.  Difficult to assess pupils Psych:  Normal affect, but anxious.   EKG:  The ECG that was done via EMS appeared to be sinus rhythm with ventricular bigeminy.  Has baseline RBBB, and apparent possible ST elevations in V3 and V4.  EKG in ER was personally reviewed and demonstrates  sinus rhythm, rate 100 bpm.  Underlying RBBB with possible old inferior MI.  Subtle 1 mm restoration in V2 with 1 to 2 mm durations in V3.  2 mm ST lesions in V4 5 and 6.  No obvious reciprocal changes noted.  Subtle depressions in aVL. ->  With presentation, findings are consistent with acute inferolateral STEMI  Relevant CV Studies:  Cardiac cath-PCI at [facility] in July 2010  Cardiac cath May 05, 2009 ([place]-Dr. Burt Knack) -severe stenosis of ostial D1 and previous LAD PCI site.  Cardiac cath April 2014 ([place]-Dr. Cooper)--patent LAD.  Previously placed LAD stent patent.  Mild in-stent restenosis.  50% mid LAD beyond stent.  Large caliber D1  with patent stent is.  Ostium has 80% focal stenosis (recommend medical management).  Nonsignificant disease of small nondominant circumflex and large dominant RCA.  EF 65%  2D echo December 2017: EF 60  to 65%.  No R WMA.  G1 DD.  Aortic valve sclerosis no stenosis.  No significant valve lesions.  Laboratory Data:  High Sensitivity Troponin:  No results for input(s): TROPONINIHS in the last 720 hours.    Chemistry Recent Labs  Lab 10/26/19 1055  CREATININE 0.50    No results for input(s): PROT, ALBUMIN, AST, ALT, ALKPHOS, BILITOT in the last 168 hours. HematologyNo results for input(s): WBC, RBC, HGB, HCT, MCV, MCH, MCHC, RDW, PLT in the last 168 hours. BNPNo results for input(s): BNP, PROBNP in the last 168 hours.  DDimer No results for input(s): DDIMER in the last 168 hours.   Radiology/Studies:  CT Angio Chest/Abd/Pel for Dissection W and/or Wo Contrast  Result Date: 10/26/2019 CLINICAL DATA:  Chest pain EXAM: CT ANGIOGRAPHY CHEST, ABDOMEN AND PELVIS TECHNIQUE: Multidetector CT imaging through the chest, abdomen and pelvis was performed using the standard protocol during bolus administration of intravenous contrast. Multiplanar reconstructed images and MIPs were obtained and reviewed to evaluate the vascular anatomy. CONTRAST:  128mL OMNIPAQUE IOHEXOL 350 MG/ML SOLN COMPARISON:  None. FINDINGS: CTA CHEST FINDINGS Cardiovascular: Preferential opacification of the thoracic aorta. No evidence of thoracic aortic aneurysm or dissection. Normal heart size. No pericardial effusion. LAD atherosclerosis. Mediastinum/Nodes: No enlarged mediastinal, hilar, or axillary lymph nodes. Thyroid gland, trachea, and esophagus demonstrate no significant findings. Lungs/Pleura: Lungs are clear. No pleural effusion or pneumothorax. Musculoskeletal: No chest wall abnormality. No acute or significant osseous findings. Review of the MIP images confirms the above findings. CTA ABDOMEN AND PELVIS FINDINGS VASCULAR  Aorta: Normal caliber aorta without aneurysm, dissection, vasculitis or significant stenosis. Celiac: Patent without evidence of aneurysm, dissection, vasculitis or significant stenosis. SMA: Patent without evidence of aneurysm, dissection, vasculitis or significant stenosis. Renals: Both renal arteries are patent without evidence of aneurysm, dissection, vasculitis, fibromuscular dysplasia or significant stenosis. IMA: Patent without evidence of aneurysm, dissection, vasculitis or significant stenosis. Inflow: Patent without evidence of aneurysm, dissection, vasculitis or significant stenosis. Veins: No obvious venous abnormality within the limitations of this arterial phase study. Review of the MIP images confirms the above findings. NON-VASCULAR Hepatobiliary: No focal liver abnormality is seen. Status post cholecystectomy. No biliary dilatation. Pancreas: Unremarkable. No pancreatic ductal dilatation or surrounding inflammatory changes. Spleen: Normal in size without focal abnormality. Adrenals/Urinary Tract: Adrenal glands are unremarkable. Kidneys are normal, without renal calculi, focal lesion, or hydronephrosis. Bladder is unremarkable. Stomach/Bowel: Stomach is within normal limits. Appendix appears normal. No evidence of bowel wall thickening, distention, or inflammatory changes. Lymphatic: No lymphadenopathy Reproductive: Uterus and bilateral adnexa are unremarkable. Other: Wide-mouth fat containing umbilical hernia. Musculoskeletal: No acute osseous abnormality. No aggressive osseous lesion. Review of the MIP images confirms the above findings. IMPRESSION: 1. No aortic aneurysm or dissection. 2. LAD atherosclerosis. Electronically Signed   By: Kathreen Devoid   On: 10/26/2019 11:16       TIMI Risk Score for ST  Elevation MI:   The patient's TIMI risk score is 3, which indicates a 4.4% risk of all cause mortality at 30 days.    Assessment and Plan:   Active Problems:   CAD S/P PCI of LAD   DM type  2, uncontrolled, with renal complications (HCC)   Acute ST elevation myocardial infarction (STEMI) of anterolateral wall (HCC)   Hyperlipidemia associated with type 2 diabetes mellitus ([place])   Essential hypertension   Sleep apnea   Morbid obesity ([place])  Patient presented with severe hypertension-by EMS blood pressure 220/1 110 mmHg, somewhat improved after  receiving nitroglycerin in the EMS truck-was 160s over 90s in the ER.  EKG via EMS was very difficult to determine without a baseline rhythm strip to determine which complexes were PVCs versus native beats.  She has an unusual RBBB with deep T wave inversion at baseline in the anterior leads making it difficult to go to determine otherwise.  However upon arrival to ER her EKG clearly shows subtle but obvious 1 to 2 mm elevations in V3 and V4 with difficulty determine ST elevations in V5 and V6.  She is having 8 out of 10 pain on my assessment, however it is radiating from the front of her chest to her back.  Given the presentation with severe hypertension and pain radiating to the back, will needed to exclude presence of aortic dissection prior to going to the Cath Lab as anterior STEMI. I discussed with the EDP, she was taken directly to radiology for CTA of the chest.  This was done and an urgent read was provided by the reading radiologist indicating no evidence of dissection.  She is therefore being taken directly to cardiac catheterization lab for emergent cardiac catheterization.  Emergency consent implied.  Dr. Angelena Form will be performing the procedure.  Questionable access site during her last cath, note indicates plan for left radial, however in the body indicates right radial access.  Dr. Angelena Form plans to do left radial access as first option given her obesity.  She is not currently on any medications from a cardiac standpoint, and is only on insulin at home.  Plan: Emergent cath-PCI with treatment post cath.  Will need care manager  consultation to assist with medication adherence in the outpatient setting: Was previously on Toprol Imdur and lisinopril  Would like to hold off ACE inhibitor given a double contrast load today, but will likely use carvedilol for additional blood pressure control.  High-dose high intensity statin  She is allergic to Plavix, and has breathing issues with Brilinta--we will likely need to be restarted on Effient  Restart ACE inhibitor/ARB once renal function stabilizes.  Needs diabetes team consultation and will likely use some semblance of her home medication regimen  We will need CPAP  Further plans pending results of cardiac cath   Severity of Illness: The appropriate patient status for this patient is INPATIENT. Inpatient status is judged to be reasonable and necessary in order to provide the required intensity of service to ensure the patient's safety. The patient's presenting symptoms, physical exam findings, and initial radiographic and laboratory data in the context of their chronic comorbidities is felt to place them at high risk for further clinical deterioration. Furthermore, it is not anticipated that the patient will be medically stable for discharge from the hospital within 2 midnights of admission. The following factors support the patient status of inpatient.   " The patient's presenting symptoms include significant 8/10 chest pain with diaphoresis and nausea. " The worrisome physical exam findings include mild basal rales and severe hypertension. " The initial radiographic and laboratory data are worrisome because of EKG suggesting anterior STEMI. " The chronic co-morbidities include morbid obesity, diabetes mellitus-2, hypertension and hyperlipidemia.   * I certify that at the point of admission it is my clinical judgment that the patient will require inpatient hospital care spanning beyond 2 midnights from the point of admission due to high intensity of service, high risk  for further deterioration and high frequency of surveillance required.*    For questions or updates, please contact [place]  Please consult [email or web address] for contact info under        Signed, Glenetta Hew, MD  10/26/2019 11:23 AM

## 2019-10-26 NOTE — Progress Notes (Signed)
Pt returned from cath lab approximately 1315. Pt denies pain at this time, only c/o nausea. PRN zofran administered per MAR. Pt husband at bedside, questions answered.

## 2019-10-26 NOTE — Progress Notes (Addendum)
MEDICATION RELATED CONSULT NOTE - INITIAL   Pharmacy Consult for insulin managment Indication: diabetes  Allergies  Allergen Reactions  . Hydrocodone Shortness Of Breath    "I forget to breathe."   . Clopidogrel Bisulfate     REACTION: rash  . Hydrocodeine [Dihydrocodeine]     Patient Measurements: Height: 5\' 2"  (157.5 cm) Weight: (!) 319 lb 14.2 oz (145.1 kg) IBW/kg (Calculated) : 50.1   Vital Signs: Temp: 97.9 F (36.6 C) (02/22 1511) Temp Source: Oral (02/22 1511) BP: 92/62 (02/22 1315) Pulse Rate: 84 (02/22 1330) Intake/Output from previous day: No intake/output data recorded. Intake/Output from this shift: No intake/output data recorded.  Labs: Recent Labs    10/26/19 1050 10/26/19 1055  WBC 9.1  --   HGB 16.4*  --   HCT 50.9*  --   PLT 268  --   CREATININE 0.70 0.50   Estimated Creatinine Clearance: 109.2 mL/min (by C-G formula based on SCr of 0.5 mg/dL).   Microbiology: Recent Results (from the past 720 hour(s))  Respiratory Panel by RT PCR (Flu A&B, Covid) - Nasopharyngeal Swab     Status: None   Collection Time: 10/26/19 10:50 AM   Specimen: Nasopharyngeal Swab  Result Value Ref Range Status   SARS Coronavirus 2 by RT PCR NEGATIVE NEGATIVE Final    Comment: (NOTE) SARS-CoV-2 target nucleic acids are NOT DETECTED. The SARS-CoV-2 RNA is generally detectable in upper respiratoy specimens during the acute phase of infection. The lowest concentration of SARS-CoV-2 viral copies this assay can detect is 131 copies/mL. A negative result does not preclude SARS-Cov-2 infection and should not be used as the sole basis for treatment or other patient management decisions. A negative result may occur with  improper specimen collection/handling, submission of specimen other than nasopharyngeal swab, presence of viral mutation(s) within the areas targeted by this assay, and inadequate number of viral copies (<131 copies/mL). A negative result must be combined  with clinical observations, patient history, and epidemiological information. The expected result is Negative. Fact Sheet for Patients:  PinkCheek.be Fact Sheet for Healthcare Providers:  GravelBags.it This test is not yet ap proved or cleared by the [place] FDA and  has been authorized for detection and/or diagnosis of SARS-CoV-2 by FDA under an Emergency Use Authorization (EUA). This EUA will remain  in effect (meaning this test can be used) for the duration of the COVID-19 declaration under Section 564(b)(1) of the Act, 21 U.S.C. section 360bbb-3(b)(1), unless the authorization is terminated or revoked sooner.    Influenza A by PCR NEGATIVE NEGATIVE Final   Influenza B by PCR NEGATIVE NEGATIVE Final    Comment: (NOTE) The Xpert Xpress SARS-CoV-2/FLU/RSV assay is intended as an aid in  the diagnosis of influenza from Nasopharyngeal swab specimens and  should not be used as a sole basis for treatment. Nasal washings and  aspirates are unacceptable for Xpert Xpress SARS-CoV-2/FLU/RSV  testing. Fact Sheet for Patients: PinkCheek.be Fact Sheet for Healthcare Providers: GravelBags.it This test is not yet approved or cleared by the [place] FDA and  has been authorized for detection and/or diagnosis of SARS-CoV-2 by  FDA under an Emergency Use Authorization (EUA). This EUA will remain  in effect (meaning this test can be used) for the duration of the  Covid-19 declaration under Section 564(b)(1) of the Act, 21  U.S.C. section 360bbb-3(b)(1), unless the authorization is  terminated or revoked. Performed at [facility], [place] [address] [place], [place] [address]  Medical History: Past Medical History:  Diagnosis Date  . Arthritis    "back, hands, hips" (02/22/2015)  . CAD (coronary artery disease)    s/p nstemi 05/2009 treated w drug-eluting  stents  . CHF (congestive heart failure) ([place])   . Depression   . Diabetes mellitus ([place])    "started when I was pregnant; not sure if it was type 1 or type 2"   . Heart attack ([place]) 2010  . Hypercholesterolemia   . Hypertension   . Obesity   . Sleep apnea    "suppose to wear mask; I don't" (02/22/2015)    Medications:  Medications Prior to Admission  Medication Sig Dispense Refill Last Dose  . insulin NPH Human (NOVOLIN N) 100 UNIT/ML injection Inject 70 Units into the skin in the morning and at bedtime.   10/26/2019 at Unknown time  . insulin regular (NOVOLIN R) 100 units/mL injection Inject 30 Units into the skin 3 (three) times daily before meals.   10/26/2019 at Unknown time  . nitroGLYCERIN (NITROSTAT) 0.4 MG SL tablet Place 1 tablet (0.4 mg total) under the tongue every 5 (five) minutes as needed for chest pain. 30 tablet 5 unk  . Blood Glucose Monitoring Suppl (TRUE METRIX METER) DEVI 1 each by Does not apply route 3 (three) times daily. 1 Device 0   . glucose blood (TRUE METRIX BLOOD GLUCOSE TEST) test strip Use as instructed 100 each 5   . Insulin Syringe-Needle U-100 (BD INSULIN SYRINGE ULTRAFINE) 31G X 15/64" 1 ML MISC As directed twice a day with Lantus and 3 times a day with Humalog 150 each 11   . TRUEPLUS LANCETS 30G MISC 1 each by Does not apply route 3 (three) times daily. 100 each 5     Assessment: 59 yo female w/ STEMI s/p cath. Pharmacy asked to assist with her insulin regimen. Last A1c was 10.6 in 2017  At home she uses NPH 70 units twice daily and regular insulin 30 units tidwc. She took her NPH this morning -last glucose= 153   Plan:  -Start NPH at 35 units bid and will see how well she eats and watch CBG trends and increase to home dose when eating well -Add sliding scale insulin -No meal coverage now but will add 2/23 if needed -A1c and lipid panel in am  Hildred Laser, PharmD Clinical Pharmacist **Pharmacist phone directory can now be found on [email or web address]  ([email or web address]).  Listed under [place].

## 2019-10-27 LAB — CBC
HCT: 48.3 % — ABNORMAL HIGH (ref 36.0–46.0)
Hemoglobin: 15.4 g/dL — ABNORMAL HIGH (ref 12.0–15.0)
MCH: 29.1 pg (ref 26.0–34.0)
MCHC: 31.9 g/dL (ref 30.0–36.0)
MCV: 91.3 fL (ref 80.0–100.0)
Platelets: 302 10*3/uL (ref 150–400)
RBC: 5.29 MIL/uL — ABNORMAL HIGH (ref 3.87–5.11)
RDW: 13.3 % (ref 11.5–15.5)
WBC: 10.7 10*3/uL — ABNORMAL HIGH (ref 4.0–10.5)
nRBC: 0 % (ref 0.0–0.2)

## 2019-10-27 LAB — BASIC METABOLIC PANEL
Anion gap: 11 (ref 5–15)
BUN: 14 mg/dL (ref 6–20)
CO2: 25 mmol/L (ref 22–32)
Calcium: 8.2 mg/dL — ABNORMAL LOW (ref 8.9–10.3)
Chloride: 101 mmol/L (ref 98–111)
Creatinine, Ser: 0.81 mg/dL (ref 0.44–1.00)
GFR calc Af Amer: >60 mL/min (ref >60)
GFR calc non Af Amer: >60 mL/min (ref >60)
Glucose, Bld: 185 mg/dL — ABNORMAL HIGH (ref 70–99)
Potassium: 5.1 mmol/L (ref 3.5–5.1)
Sodium: 137 mmol/L (ref 135–145)

## 2019-10-27 LAB — GLUCOSE, CAPILLARY
Glucose-Capillary: 174 mg/dL — ABNORMAL HIGH (ref 70–99)
Glucose-Capillary: 279 mg/dL — ABNORMAL HIGH (ref 70–99)
Glucose-Capillary: 446 mg/dL — ABNORMAL HIGH (ref 70–99)
Glucose-Capillary: 299 mg/dL — ABNORMAL HIGH (ref 70–99)

## 2019-10-27 LAB — LIPID PANEL
Cholesterol: 174 mg/dL (ref 0–200)
HDL: 50 mg/dL (ref >40)
LDL Cholesterol: 103 mg/dL — ABNORMAL HIGH (ref 0–99)
Total CHOL/HDL Ratio: 3.5 RATIO
Triglycerides: 104 mg/dL (ref <150)
VLDL: 21 mg/dL (ref 0–40)

## 2019-10-27 LAB — HEMOGLOBIN A1C
Hgb A1c MFr Bld: 11.7 % — ABNORMAL HIGH (ref 4.8–5.6)
Mean Plasma Glucose: 289.09 mg/dL

## 2019-10-27 MED ORDER — CHLORHEXIDINE GLUCONATE CLOTH 2 % EX PADS: 6.0000 | MEDICATED_PAD | Freq: Every day | CUTANEOUS | Status: DC

## 2019-10-27 MED ORDER — TICAGRELOR 90 MG PO TABS
90.0000 mg | ORAL_TABLET | Freq: Two times a day (BID) | ORAL | Status: DC
  Administered 2019-10-27 – 2019-10-28 (×2): 90 mg via ORAL
  Filled 2019-10-27 (×2): qty 1

## 2019-10-27 MED ORDER — LIVING WELL WITH DIABETES BOOK
Freq: Once | Status: AC
  Filled 2019-10-27: qty 1

## 2019-10-27 MED ORDER — TICAGRELOR 90 MG PO TABS
180.0000 mg | ORAL_TABLET | Freq: Once | ORAL | Status: AC
  Administered 2019-10-27: 180 mg via ORAL
  Filled 2019-10-27: qty 2

## 2019-10-27 MED ORDER — INSULIN NPH (HUMAN) (ISOPHANE) 100 UNIT/ML ~~LOC~~ SUSP
40.0000 [IU] | Freq: Two times a day (BID) | SUBCUTANEOUS | Status: DC
  Administered 2019-10-27 – 2019-10-28 (×3): 40 [IU] via SUBCUTANEOUS
  Filled 2019-10-27 (×2): qty 10

## 2019-10-27 NOTE — Progress Notes (Addendum)
Progress Note  Patient Name: Brenda [name] Date of Encounter: 10/27/2019  Primary Cardiologist: No primary care provider on file.   Patient profile   59 y/o female with history of CAD and PCI to LAD 2010, uncontrolled DM, morbid obesity, OSA, who presented with chest pain and found to have anterior STEMI. She underwent emergent heart cath and had successful stent placement to a 100% mid LAD occlusion (with overlap with old stent).  She was also hypertensive at 220/110 when EMS saw her. CT angio performed prior to cath and ruled out aortic disection. Her BP apparently improved with sublingual NG and did not require IV antihypertensive agent.  Subjective   Patient was seen and evaluated at bedside this morning. No acute events overnight. She mentions that she is doing much better. No further chest pain. No SOB.  She mentions that she had heart disease and stent placement several years ago but due to loosing her insurance, she has not been on any medications for along time expect the NPH insulin that she buys over the counter. She has not seen a doctor for a while. 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 We talked to her about the heart attacak she had and that she will need to take the new medications for the new stent. She verbalizes understanding and agrees to work with Transport planner for medication or insurance coverage assistance.  Inpatient Medications    Scheduled Meds: . aspirin EC  81 mg Oral Daily  . atorvastatin  80 mg Oral q1800  . Chlorhexidine Gluconate Cloth  6 each Topical Daily  . influenza vac split quadrivalent PF  0.5 mL Intramuscular Tomorrow-1000  . insulin aspart  0-20 Units Subcutaneous TID WC  . insulin aspart  0-5 Units Subcutaneous QHS  . insulin NPH Human  35 Units Subcutaneous BID AC & HS  . metoprolol tartrate  25 mg Oral BID  . pneumococcal 23 valent vaccine  0.5 mL Intramuscular Tomorrow-1000  . prasugrel  10 mg Oral Daily  . sodium chloride  flush  3 mL Intravenous Q12H   Continuous Infusions: . sodium chloride     PRN Meds: sodium chloride, acetaminophen, nitroGLYCERIN, ondansetron (ZOFRAN) IV, sodium chloride flush   Vital Signs    Vitals:   10/27/19 0530 10/27/19 0600 10/27/19 0630 10/27/19 0700  BP: 109/86 (!) 121/57 111/75 117/62  Pulse: 66 74 66 67  Resp: 17 (!) 26 16 16   Temp:      TempSrc:      SpO2: 90% 96% 96% (!) 85%  Weight:      Height:        Intake/Output Summary (Last 24 hours) at 10/27/2019 0710 Last data filed at 10/27/2019 0600 Gross per 24 hour  Intake 1797.72 ml  Output 1050 ml  Net 747.72 ml   Last 3 Weights 10/27/2019 10/26/2019 08/16/2016  Weight (lbs) 319 lb 14.2 oz 319 lb 14.2 oz 270 lb 6.4 oz  Weight (kg) 145.1 kg 145.1 kg 122.653 kg      Telemetry    RBBB - Personally Reviewed  ECG    T inversion in ant leads:RBBB - Personally Reviewed  Physical Exam   GEN: Obese pleasant lady, no acute distress.  Remarkably improved from yesterday Neck: No JVD -unable to really evaluate Cardiac: RRR, no murmurs, rubs, or gallops.  Normal S1 and split S2 Respiratory: Clear to auscultation bilaterally. GI: Soft, nontender, non-distended  MS: Trace bilateral LE edema; No deformity. Neuro:  Nonfocal  Psych: Normal affect   Labs    High Sensitivity Troponin:   Recent Labs  Lab 10/26/19 1050 10/26/19 1443  TROPONINIHS 43* 2,032*      Chemistry Recent Labs  Lab 10/26/19 1050 10/26/19 1055 10/27/19 0219  NA 138  --  137  K 3.9  --  5.1  CL 101  --  101  CO2 24  --  25  GLUCOSE 171*  --  185*  BUN 11  --  14  CREATININE 0.70 0.50 0.81  CALCIUM 8.6*  --  8.2*  GFRNONAA >60  --  >60  GFRAA >60  --  >60  ANIONGAP 13  --  11     Hematology Recent Labs  Lab 10/26/19 1050 10/27/19 0219  WBC 9.1 10.7*  RBC 5.71* 5.29*  HGB 16.4* 15.4*  HCT 50.9* 48.3*  MCV 89.1 91.3  MCH 28.7 29.1  MCHC 32.2 31.9  RDW 12.8 13.3  PLT 268 302    BNPNo results for input(s): BNP,  PROBNP in the last 168 hours.   DDimer No results for input(s): DDIMER in the last 168 hours.   Radiology    CARDIAC CATHETERIZATION  Result Date: 10/26/2019  Prox Cx to Mid Cx lesion is 30% stenosed.  Prox LAD to Mid LAD lesion is 10% stenosed.  Mid LAD lesion is 100% stenosed.  1st Diag-1 lesion is 80% stenosed.  1st Diag-2 lesion is 70% stenosed.  A drug-eluting stent was successfully placed using a SYNERGY XD 2.50X28.  Post intervention, there is a 0% residual stenosis.  1. Acute anterior STEMI secondary to occlusion of the mid LAD 2. Successful PTCA/DES x 1 mid LAD (overlapping with old stent) 3. 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 There is TIMI-3 flow down the Diagonal branch. 4. The Circumflex is a small caliber vessel with mild non-obstructive plaque 5. The RCA is a large dominant vessel with no obstructive disease Recommendations: Will admit to the ICU. Continue Aggrastat infusion for 2 hours. Will continue DAPT with ASA and Effient for one year. Will start a high intensity statin. Will start Lopressor 25 mg po BID. Will need to review home medications and decide the best way to add in an Ace-inh or ARB. She may do best with Coreg for better BP control. Echo in am. Pharmacy to assist with dosing of insulin. Aggressive risk factor modification.   ECHOCARDIOGRAM COMPLETE  Result Date: 10/26/2019    ECHOCARDIOGRAM REPORT   Patient Name:   Brenda [name] Date of Exam: 10/26/2019 Medical Rec #:  [id number]      Height:       62.0 in Accession #:    [id number]     Weight:       319.9 lb Date of Birth:  19-Jan-1961      BSA:          2.334 m Patient Age:    79 years       BP:           115/72 mmHg Patient Gender: F              HR:           84 bpm. Exam Location:  Inpatient Procedure: Intracardiac Opacification Agent Indications:    CAD Native Vessel 414.01 / I25.10  History:        Patient has prior history of Echocardiogram examinations, most  recent 08/20/2016. CAD and STEMI; Risk Factors:Sleep Apnea and                 Morbid obesity.  Sonographer:    Vikki Ports Turrentine Referring Phys: [place]  1. Left ventricular ejection fraction, by estimation, is 60 to 65%. The left ventricle has normal function. The left ventricle has no regional wall motion abnormalities. There is mild concentric left ventricular hypertrophy. Left ventricular diastolic parameters are consistent with Grade I diastolic dysfunction (impaired relaxation). Elevated left ventricular end-diastolic pressure.  2. Right ventricular systolic function is normal. The right ventricular size is normal.  3. Left atrial size was mildly dilated.  4. The mitral valve is normal in structure and function. No evidence of mitral valve regurgitation. No evidence of mitral stenosis.  5. The aortic valve is normal in structure and function. Aortic valve regurgitation is not visualized. No aortic stenosis is present. FINDINGS  Left Ventricle: Left ventricular ejection fraction, by estimation, is 60 to 65%. The left ventricle has normal function. The left ventricle has no regional wall motion abnormalities. Definity contrast agent was given IV to delineate the left ventricular  endocardial borders. The left ventricular internal cavity size was normal in size. There is mild concentric left ventricular hypertrophy. Left ventricular diastolic parameters are consistent with Grade I diastolic dysfunction (impaired relaxation). Elevated left ventricular end-diastolic pressure. Right Ventricle: The right ventricular size is normal. No increase in right ventricular wall thickness. Right ventricular systolic function is normal. Left Atrium: Left atrial size was mildly dilated. Right Atrium: Right atrial size was normal in size. Pericardium: There is no evidence of pericardial effusion. Mitral Valve: The mitral valve is normal in structure and function. Normal mobility of the mitral  valve leaflets. No evidence of mitral valve regurgitation. No evidence of mitral valve stenosis. Tricuspid Valve: The tricuspid valve is normal in structure. Tricuspid valve regurgitation is not demonstrated. No evidence of tricuspid stenosis. Aortic Valve: The aortic valve is normal in structure and function. Aortic valve regurgitation is not visualized. No aortic stenosis is present. Aortic valve mean gradient measures 12.0 mmHg. Aortic valve peak gradient measures 21.5 mmHg. Aortic valve area, by VTI measures 1.48 cm. Pulmonic Valve: The pulmonic valve was normal in structure. Pulmonic valve regurgitation is trivial. No evidence of pulmonic stenosis. Aorta: The aortic root is normal in size and structure. Venous: The inferior vena cava was not well visualized. IAS/Shunts: No atrial level shunt detected by color flow Doppler.  LEFT VENTRICLE PLAX 2D LVIDd:         5.15 cm  Diastology LVIDs:         4.06 cm  LV e' lateral:   6.73 cm/s LV PW:         1.13 cm  LV E/e' lateral: 16.0 LV IVS:        1.12 cm LVOT diam:     2.00 cm LV SV:         71.00 ml LV SV Index:   30.42 LVOT Area:     3.14 cm  LEFT ATRIUM         Index LA diam:    4.50 cm 1.93 cm/m  AORTIC VALVE AV Area (Vmax):    1.50 cm AV Area (Vmean):   1.47 cm AV Area (VTI):     1.48 cm AV Vmax:           232.00 cm/s AV Vmean:          162.000 cm/s  AV VTI:            0.480 m AV Peak Grad:      21.5 mmHg AV Mean Grad:      12.0 mmHg LVOT Vmax:         111.00 cm/s LVOT Vmean:        75.800 cm/s LVOT VTI:          0.226 m LVOT/AV VTI ratio: 0.47  AORTA Ao Root diam: 2.60 cm MITRAL VALVE MV Area (PHT): 2.87 cm     SHUNTS MV Decel Time: 264 msec     Systemic VTI:  0.23 m MV E velocity: 108.00 cm/s  Systemic Diam: 2.00 cm MV A velocity: 154.00 cm/s MV E/A ratio:  0.70 Fransico Him MD Electronically signed by Fransico Him MD Signature Date/Time: 10/26/2019/7:19:02 PM    Final    CT Angio Chest/Abd/Pel for Dissection W and/or Wo Contrast  Result Date:  10/26/2019 CLINICAL DATA:  Chest pain EXAM: CT ANGIOGRAPHY CHEST, ABDOMEN AND PELVIS TECHNIQUE: Multidetector CT imaging through the chest, abdomen and pelvis was performed using the standard protocol during bolus administration of intravenous contrast. Multiplanar reconstructed images and MIPs were obtained and reviewed to evaluate the vascular anatomy. CONTRAST:  170mL OMNIPAQUE IOHEXOL 350 MG/ML SOLN COMPARISON:  None. FINDINGS: CTA CHEST FINDINGS Cardiovascular: Preferential opacification of the thoracic aorta. No evidence of thoracic aortic aneurysm or dissection. Normal heart size. No pericardial effusion. LAD atherosclerosis. Mediastinum/Nodes: No enlarged mediastinal, hilar, or axillary lymph nodes. Thyroid gland, trachea, and esophagus demonstrate no significant findings. Lungs/Pleura: Lungs are clear. No pleural effusion or pneumothorax. Musculoskeletal: No chest wall abnormality. No acute or significant osseous findings. Review of the MIP images confirms the above findings. CTA ABDOMEN AND PELVIS FINDINGS VASCULAR Aorta: Normal caliber aorta without aneurysm, dissection, vasculitis or significant stenosis. Celiac: Patent without evidence of aneurysm, dissection, vasculitis or significant stenosis. SMA: Patent without evidence of aneurysm, dissection, vasculitis or significant stenosis. Renals: Both renal arteries are patent without evidence of aneurysm, dissection, vasculitis, fibromuscular dysplasia or significant stenosis. IMA: Patent without evidence of aneurysm, dissection, vasculitis or significant stenosis. Inflow: Patent without evidence of aneurysm, dissection, vasculitis or significant stenosis. Veins: No obvious venous abnormality within the limitations of this arterial phase study. Review of the MIP images confirms the above findings. NON-VASCULAR Hepatobiliary: No focal liver abnormality is seen. Status post cholecystectomy. No biliary dilatation. Pancreas: Unremarkable. No pancreatic ductal  dilatation or surrounding inflammatory changes. Spleen: Normal in size without focal abnormality. Adrenals/Urinary Tract: Adrenal glands are unremarkable. Kidneys are normal, without renal calculi, focal lesion, or hydronephrosis. Bladder is unremarkable. Stomach/Bowel: Stomach is within normal limits. Appendix appears normal. No evidence of bowel wall thickening, distention, or inflammatory changes. Lymphatic: No lymphadenopathy Reproductive: Uterus and bilateral adnexa are unremarkable. Other: Wide-mouth fat containing umbilical hernia. Musculoskeletal: No acute osseous abnormality. No aggressive osseous lesion. Review of the MIP images confirms the above findings. IMPRESSION: 1. No aortic aneurysm or dissection. 2. LAD atherosclerosis. Electronically Signed   By: Kathreen Devoid   On: 10/26/2019 11:16    Cardiac Studies  Heart Cath 10/26/2019   Prox Cx to Mid Cx lesion is 30% stenosed.  Prox LAD to Mid LAD lesion is 10% stenosed.  Mid LAD lesion is 100% stenosed.  1st Diag-1 lesion is 80% stenosed.  1st Diag-2 lesion is 70% stenosed.  A drug-eluting stent was successfully placed using a SYNERGY XD 2.50X28.  Post intervention, there is a 0% residual stenosis.   1. Acute anterior STEMI  secondary to occlusion of the mid LAD 2. Successful PTCA/DES x 1 mid LAD (overlapping with old stent) 3. 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 There is TIMI-3 flow down the Diagonal branch.  4. The Circumflex is a small caliber vessel with mild non-obstructive plaque 5. The RCA is a large dominant vessel with no obstructive disease  Recommendations: Will admit to the ICU. Continue Aggrastat infusion for 2 hours. Will continue DAPT with ASA and Effient for one year. Will start a high intensity statin. Will start Lopressor 25 mg po BID. Will need to review home medications and decide the best way to add in an Ace-inh or ARB. She may do best with Coreg for better BP control.  Echo in am. Pharmacy to assist with dosing of insulin. Aggressive risk factor modification.   Diagnostic Dominance: Right  Intervention    Echo 10/26/2019  IMPRESSIONS   1. Left ventricular ejection fraction, by estimation, is 60 to 65%. The  left ventricle has normal function. The left ventricle has no regional  wall motion abnormalities. There is mild concentric left ventricular  hypertrophy. Left ventricular diastolic  parameters are consistent with Grade I diastolic dysfunction (impaired  relaxation). Elevated left ventricular end-diastolic pressure.  2. Right ventricular systolic function is normal. The right ventricular  size is normal.  3. Left atrial size was mildly dilated.  4. The mitral valve is normal in structure and function. No evidence of  mitral valve regurgitation. No evidence of mitral stenosis.  5. The aortic valve is normal in structure and function. Aortic valve  regurgitation is not visualized. No aortic stenosis is present.   Patient Profile     59 y.o. female with history of CAD and PCI to LAD 2010, uncontrolled DM, morbid obesity, OSA, who presented with chest pain radiated to her back and found to have anterior STEMI. She underwent emergent heart cath and had successful stent placement to a 100% mid LAD occlusion (with overlap with old stent). She was also hypertensive at 220/110 when EMS saw her. CT angio performed prior to cath and ruled out aortic disection. BP apparently improved with sublingual NG and did not require IV antihypertensive agent.  Assessment & Plan    Anterior STEMI: S/p Mid LAD DES 10/26/2019 CAD s/p prior LAD stent 2010 Echo performed yesterday and was unremarkable: showed EF 60-65%, grade 1DD.   Asymptomatic. VS stable Normal EF on echo Tele with no arrhythmia.  -Continue ASA and Effient (received Aggrastat for 2 h post cath) -Continue Lipitor 80 mg QD -Continue Metoprolol tartrate 25 mg BID -May consider to startACE  I/ARB after DC given diabetes -Ambulate patient and if tolerates that, may transfer to cardiac tele floor today -Consult case manager for medication assistance and insurance/coverage options -She will need PCP   Hypertensive urgency: BP 220/110 on EMS arrival. CT angio ruled out aortic dissection. BP improved without IV medication and  apparently after sublingual nitro given by EMS. Started on Metoprolol Tartrate 25 mg BID and BP now normal at 111/75. -Continue Metoprolol tartrate 25 mg BID -May consider ACE I/ARB after discharge, given diabetes  DM: Uncontrolled.  HbA1c 11.7 She does not have PCP and buys over the counter NPH and injects that (&0 u BID?) CBG here 170-180s.  -Continue SSI and NPH 40 u BID in hospital -CBG monitoring -Lack of insurance is a barrier. Case manager consulted for medication assistance  For questions or updates, please contact [place] Please consult [email or web address]  for contact info under        Signed, Dewayne Hatch, MD  10/27/2019, 7:10 AM

## 2019-10-27 NOTE — Progress Notes (Addendum)
Inpatient Diabetes Program Recommendations  AACE/ADA: New Consensus Statement on Inpatient Glycemic Control (2015)  Target Ranges:  Prepandial:   less than 140 mg/dL      Peak postprandial:   less than 180 mg/dL (1-2 hours)      Critically ill patients:  140 - 180 mg/dL   Lab Results  Component Value Date   GLUCAP 174 (H) 10/27/2019   HGBA1C 11.7 (H) 10/27/2019    Review of Glycemic Control Results for Brenda [name], Brenda [name] (MRN [id number]) as of 10/27/2019 10:55  Ref. Range 10/26/2019 14:05 10/26/2019 15:14 10/26/2019 18:11 10/26/2019 22:16 10/27/2019 06:52  Glucose-Capillary Latest Ref Range: 70 - 99 mg/dL 83 153 (H) 165 (H) 156 (H) 174 (H)   Diabetes history: DM2 Outpatient Diabetes medications: Novolin 70 units am & hs + Novolin R 30 units tid Current orders for Inpatient glycemic control: Novolog N 40 units am & hs + Novolog resistant correction tid + hs 0-5 units  Inpatient Diabetes Program Recommendations:   Noted patient's records from prior admission and seen by DM coordinator was in 2016 with A1c 13.5. Will plan to see patient and discuss diabetes care and management for home. Ordered Living Well With Diabetes book. Patient has consult to care management and has no insurance listed. Will need portion of meal coverage added if eating >50% meals Novolog 5 units tid if eats 50%.  Spoke with patient @ bedside concerning diabetes management. Discussed A1c of 11.7 with patient and need to plan for glycemic control. 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 Gave handouts on the plate method and answered questions along with A1c handout. Patient states she understands the reason for glycemic control and need to change. Discussed followup with PCP which is to be referred to. Educated patient on insulin pen use at home. Reviewed contents of insulin flexpen starter kit. Reviewed all steps if insulin pen  including attachment of needle, 2-unit air shot, dialing up dose, giving injection, removing needle, disposal of sharps, storage of unused insulin, disposal of insulin etc. Patient able to provide successful return demonstration. Also reviewed troubleshooting with insulin pen.   Thank you, Nani Gasser. Starling Jessie, RN, MSN, CDE  Diabetes Coordinator Inpatient Glycemic Control Team Team Pager [phone] (8am-5pm) 10/27/2019 10:58 AM

## 2019-10-27 NOTE — Progress Notes (Signed)
CARDIAC REHAB PHASE I   PRE:  Rate/Rhythm: 71 SR  BP:  Supine:   Sitting: 117/62  Standing:    SaO2: 93% 1L  MODE:  Ambulation: 200 ft   POST:  Rate/Rhythm: 87 SR  BP:  Supine:   Sitting: 153/88  Standing:    SaO2: 88%RA until up walking and then 95% 0823-0950 Pt walked 200 ft on RA with sats checked whole walk. Her sats improved with walking as she was taking deep breaths. She did have visible DOE which pt stated was not unusual for her. No CP. Stopped once to rest and encouraged pursed lip breathing. Left off oxygen and notified RN. Gait steady. Pt has attended CRP 2 before and referral sent to [facility]. Pt not sure if she can do or not as she has arthritis in hips but will consider virtual program and CRP 2. Discussed importance of effient with stent. Reviewed NTG use, MI restrictions, gave heart healthy and diabetic diets. Discussed carb counting. A1C at 11.7.   Pt stated she is taking insulin but it is regular and NPH generic brand from drugstore and has been harder to control. Understanding voiced of ed. Pt has nephew with special needs that she has been taking care of also. Pt is interested in participating in Virtual Cardiac and Pulmonary Rehab. Pt advised that Virtual Cardiac and Pulmonary Rehab is provided at no cost to the patient.  Checklist:  1. Pt has smart device  ie smartphone and/or ipad for downloading an app  Yes 2. Reliable internet/wifi service    Yes 3. Understands how to use their smartphone and navigate within an app.  Yes   Pt verbalized understanding and is in agreement.    Graylon Good, RN BSN  10/27/2019 9:41 AM

## 2019-10-27 NOTE — Care Management (Signed)
1712 10-27-19 Case Manager received consult for Medication Assistance and [place] completed. Please send all new prescriptions to [facility]. Per patient she is not working. Prior to arrival she wad from home with spouse who is at the bedside. Case Manager did try to call the [facility] answer. If the covering Case Manager can call to set up appointment with the clinic-greatly appreciated. No further home needs identified at this time.  [name], RN,BSN Case Manager

## 2019-10-28 ENCOUNTER — Telehealth: Payer: Self-pay | Admitting: Physician Assistant

## 2019-10-28 ENCOUNTER — Encounter (HOSPITAL_COMMUNITY): Payer: Self-pay | Admitting: Cardiology

## 2019-10-28 DIAGNOSIS — Z794 Long term (current) use of insulin: Secondary | ICD-10-CM

## 2019-10-28 DIAGNOSIS — G4733 Obstructive sleep apnea (adult) (pediatric): Secondary | ICD-10-CM

## 2019-10-28 DIAGNOSIS — I2102 ST elevation (STEMI) myocardial infarction involving left anterior descending coronary artery: Principal | ICD-10-CM

## 2019-10-28 DIAGNOSIS — E118 Type 2 diabetes mellitus with unspecified complications: Secondary | ICD-10-CM

## 2019-10-28 LAB — HEPATIC FUNCTION PANEL
ALT: 18 U/L (ref 0–44)
AST: 24 U/L (ref 15–41)
Albumin: 2.9 g/dL — ABNORMAL LOW (ref 3.5–5.0)
Alkaline Phosphatase: 80 U/L (ref 38–126)
Bilirubin, Direct: 0.1 mg/dL (ref 0.0–0.2)
Indirect Bilirubin: 0.7 mg/dL (ref 0.3–0.9)
Total Bilirubin: 0.8 mg/dL (ref 0.3–1.2)
Total Protein: 6.7 g/dL (ref 6.5–8.1)

## 2019-10-28 LAB — GLUCOSE, CAPILLARY
Glucose-Capillary: 94 mg/dL (ref 70–99)
Glucose-Capillary: 225 mg/dL — ABNORMAL HIGH (ref 70–99)

## 2019-10-28 MED ORDER — ATORVASTATIN CALCIUM 80 MG PO TABS: 80.0000 mg | ORAL_TABLET | Freq: Every evening | ORAL | 6 refills | Status: DC

## 2019-10-28 MED ORDER — NOVOLIN 70/30 FLEXPEN (70-30) 100 UNIT/ML ~~LOC~~ SUPN: 50.0000 [IU] | PEN_INJECTOR | Freq: Two times a day (BID) | SUBCUTANEOUS | 1 refills | Status: DC

## 2019-10-28 MED ORDER — NITROGLYCERIN 0.4 MG SL SUBL: 0.4000 mg | SUBLINGUAL_TABLET | SUBLINGUAL | 3 refills | Status: DC | PRN

## 2019-10-28 MED ORDER — ASPIRIN EC 81 MG PO TBEC: 81.0000 mg | DELAYED_RELEASE_TABLET | Freq: Every day | ORAL | 11 refills | Status: DC

## 2019-10-28 MED ORDER — INSULIN PEN NEEDLE 30G X 8 MM MISC: 1 refills | Status: DC

## 2019-10-28 MED ORDER — METOPROLOL TARTRATE 25 MG PO TABS: 25.0000 mg | ORAL_TABLET | Freq: Two times a day (BID) | ORAL | 6 refills | Status: DC

## 2019-10-28 MED ORDER — TICAGRELOR 90 MG PO TABS: 90.0000 mg | ORAL_TABLET | Freq: Two times a day (BID) | ORAL | 11 refills | Status: DC

## 2019-10-28 MED FILL — NITROGLYCERIN 0.4 MG TAB SL: 0.4 | 8 days supply | Qty: 25 | Fill #0

## 2019-10-28 MED FILL — ATORVASTATIN CALCIUM 80 MG: 80 | 30 days supply | Qty: 30 | Fill #0

## 2019-10-28 MED FILL — METOPROLOL TARTRATE 25 MG T: 25 | 30 days supply | Qty: 60 | Fill #0

## 2019-10-28 MED FILL — PENTIPS 31G X 8 MM MISC: 31G X 8 MM | 30 days supply | Qty: 100 | Fill #0

## 2019-10-28 MED FILL — ASPIRIN 81 MG TBEC: 81 | 30 days supply | Qty: 30 | Fill #0

## 2019-10-28 MED FILL — BRILINTA 90 MG TABLET: 90 | 30 days supply | Qty: 60 | Fill #0

## 2019-10-28 MED FILL — NOVOLIN 70/30 FLEXPEN (70-3: (70-30) 100 | 21 days supply | Qty: 21 | Fill #0

## 2019-10-28 MED FILL — Tirofiban HCl in NaCl 0.9% IV Soln 5 MG/100ML (Base Equiv): INTRAVENOUS | Qty: 100 | Status: AC

## 2019-10-28 NOTE — Progress Notes (Signed)
CARDIAC REHAB PHASE I   PRE:  Rate/Rhythm: 68 SR  BP:  Supine:   Sitting: 129/69  Standing:    SaO2: 94%RA  MODE:  Ambulation: 200 ft   POST:  Rate/Rhythm: 88 SR  BP:  Supine:   Sitting: 145/78  Standing:    SaO2: 98%RA 1011-1040 Pt walked 200 ft on RA with steady gait. Stopped several times to rest due to SOB and hip pain. Had to sit due to hip pain at door coming back. Pt has recumbent bike at home and plans to use it since she cannot walk long distances. Encouraged her to start slowly and to increase time as tolerated. Discussed that ex helps to lower glucose, improve cholesterol and weight loss. Pt stated she needs to take care of herself which I encouraged.   Graylon Good, RN BSN  10/28/2019 10:39 AM

## 2019-10-28 NOTE — TOC Transition Note (Signed)
Transition of Care (TOC) - CM/SW Discharge Note Marvetta Gibbons RN, BSN Transitions of Care Unit 4E- RN Case Manager (6E cross coverage) [phone]  Patient Details  Name: Brenda [name] MRN: [id number] Date of Birth: 02/03/1961  [facility]) CM/SW Contact:  Dawayne Patricia, RN Phone Number: 10/28/2019, 2:15 PM   Clinical Narrative:    Pt stable for transition home today- call made to [facility]- for f/u appointment- made for 3/4 at 10:30 this will be a virtual appointment. Meds have been sent to [facility] was done per previous CM note. Meds including Brilinta 30 day free to be delivered to bedside prior to discharge.    Final next level of care: Home/Self Care Barriers to Discharge: No Barriers Identified      Discharge Placement                Home        Discharge Plan and Services                DME Arranged: N/A DME Agency: NA       HH Arranged: NA HH Agency: NA        Social Determinants of Health (SDOH) Interventions     Readmission Risk Interventions Readmission Risk Prevention Plan 10/28/2019  Post Dischage Appt Complete  Medication Screening Complete  Transportation Screening Complete  Some recent data might be hidden

## 2019-10-28 NOTE — Discharge Summary (Signed)
Discharge Summary    Patient ID: Brenda [name] MRN: [id number]; DOB: Jan 25, 1961  Admit date: 10/26/2019 Discharge date: 10/28/2019  Primary Care Provider: Charlott Rakes, MD  Primary Cardiologist: Sherren Mocha, MD  Primary Electrophysiologist:  None   Discharge Diagnoses    Principal Problem:   Acute ST elevation myocardial infarction (STEMI) due to occlusion of left anterior descending (LAD) coronary artery [facility]) Active Problems:   Hyperlipidemia associated with type 2 diabetes mellitus ([place])   Essential hypertension   CAD S/P PCI of LAD   Sleep apnea   Morbid obesity ([place])   Type 2 diabetes mellitus with complication, with long-term current use of insulin ([facility])   Acute ST elevation myocardial infarction (STEMI) of anterolateral wall ([place])    Diagnostic Studies/Procedures    2D echo 10/26/19 1. Left ventricular ejection fraction, by estimation, is 60 to 65%. The  left ventricle has normal function. The left ventricle has no regional  wall motion abnormalities. There is mild concentric left ventricular  hypertrophy. Left ventricular diastolic  parameters are consistent with Grade I diastolic dysfunction (impaired  relaxation). Elevated left ventricular end-diastolic pressure.  2. Right ventricular systolic function is normal. The right ventricular  size is normal.  3. Left atrial size was mildly dilated.  4. The mitral valve is normal in structure and function. No evidence of  mitral valve regurgitation. No evidence of mitral stenosis.  5. The aortic valve is normal in structure and function. Aortic valve  regurgitation is not visualized. No aortic stenosis is present.  LHC 10/26/19  Prox Cx to Mid Cx lesion is 30% stenosed.  Prox LAD to Mid LAD lesion is 10% stenosed.  Mid LAD lesion is 100% stenosed.  1st Diag-1 lesion is 80% stenosed.  1st Diag-2 lesion is 70% stenosed.  A drug-eluting stent was successfully placed using a SYNERGY XD 2.50X28.  Post  intervention, there is a 0% residual stenosis.   1. Acute anterior STEMI secondary to occlusion of the mid LAD 2. Successful PTCA/DES x 1 mid LAD (overlapping with old stent) 3. 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 There is TIMI-3 flow down the Diagonal branch.  4. The Circumflex is a small caliber vessel with mild non-obstructive plaque 5. The RCA is a large dominant vessel with no obstructive disease  Recommendations: Will admit to the ICU. Continue Aggrastat infusion for 2 hours. Will continue DAPT with ASA and Effient for one year. Will start a high intensity statin. Will start Lopressor 25 mg po BID. Will need to review home medications and decide the best way to add in an Ace-inh or ARB. She may do best with Coreg for better BP control. Echo in am. Pharmacy to assist with dosing of insulin. Aggressive risk factor modification.     _____________   History of Present Illness     Brenda [name] is a 59 y.o. female with CAD with NSTEMI s/p complex LAD/diagonal bifurcation PCI in 2010, uncontrolled DM, medication nonadherence in context of losing insurance, morbid obesity, OSA who presented with chest pain radiating to her back. She was markedly hypertensive. In the ED she underwent CTA that excluded dissection. However, EKG showed a RBBB with changes concerning for anterior STEMI. She underwent emergent heart cath and had successful stent placement to a 100% mid LAD occlusion (with overlap with old stent).  She was also markedly hypertensive. She was taken emergently to the cath lab.  Hospital Course     Consultants: DM coordinator  1.  Anterior STEMI/CAD - cath showed occlusion of the mid LAD as the culprit. She was treated with PTCA/DES x1 overlapping the old stent. Other residual findings as noted above. LVEF was normal. She is now on ASA, BB, statin, Brilinta. She was seen by CM with plan for [facility] card completion to get her 30 day medications through  the [facility] prior to discharge.   2. Accelerated HTN present on admission - CT angio ruled out aortic dissection on admission. BP improved post cath, maintaining SBP 120-140s on metoprolol. Can consider ACEI/ARB given DM as OP if potassium comes back to more normal range - currently upper limits of normal so would not start acutely (K 5.1).   3. Uncontrolled DM - A1C 11.7. Outpatient regimen was NPH 70 units BID, and regular insulin 30u TID.  Diabetes coordinator reviewed patient's case. Given that she will be using MATCH program, they recommended Novolin 70/30 50 units BID for discharge [id number] Flexpen) and 30G pen needles [id number]) to take with a meal. Rxs will be sent to [facility] as well. Care management has assisted in arranging PCP follow-up.  4. Hyperlipidemia with goal LDL <70 - LDL 103 on profile here. Statin started. Baseline LFTs are pending. If the patient is tolerating statin at time of follow-up appointment, would consider rechecking liver function/lipid panel in 6-8 weeks.  5. Elevated Hgb in 15-16 range - will need OP f/u primary care for this.  6. Morbid obesity with OSA - will need lifestyle modification as OP given that severe obesity will be major crux of issues going forward. Received education from cardiac rehab team as well.  Dr. Ellyn Hack has seen and examined the patient today and feels she is stable for discharge. TOC followup has been arranged.    Did the patient have an acute coronary syndrome (MI, NSTEMI, STEMI, etc) this admission?:  Yes                               AHA/ACC Clinical Performance & Quality Measures: 1. Aspirin prescribed? - Yes 2. ADP Receptor Inhibitor (Plavix/Clopidogrel, Brilinta/Ticagrelor or Effient/Prasugrel) prescribed (includes medically managed patients)? - Yes 3. Beta Blocker prescribed? - Yes 4. High Intensity Statin (Lipitor 40-80mg  or Crestor 20-40mg ) prescribed? - Yes 5. EF assessed during THIS hospitalization? -  Yes 6. For EF <40%, was ACEI/ARB prescribed? - Not Applicable (EF >/= [id number]) 7. For EF <40%, Aldosterone Antagonist (Spironolactone or Eplerenone) prescribed? - Not Applicable (EF >/= [id number]) 8. Cardiac Rehab Phase II ordered (Included Medically managed Patients)? - Yes   _____________  Discharge Vitals Blood pressure 129/63, pulse 63, temperature 97.6 F (36.4 C), temperature source Oral, resp. rate 20, height 5\' 2"  (1.575 m), weight (!) 140.7 kg, SpO2 96 %.  Filed Weights   10/26/19 1345 10/27/19 0500 10/28/19 0504  Weight: (!) 145.1 kg (!) 145.1 kg (!) 140.7 kg    Labs & Radiologic Studies    CBC Recent Labs    10/26/19 1050 10/27/19 0219  WBC 9.1 10.7*  NEUTROABS 6.5  --   HGB 16.4* 15.4*  HCT 50.9* 48.3*  MCV 89.1 91.3  PLT 268 [id number]   Basic Metabolic Panel Recent Labs    10/26/19 1050 10/26/19 1050 10/26/19 1055 10/27/19 0219  NA 138  --   --  137  K 3.9  --   --  5.1  CL 101  --   --  101  CO2 24  --   --  25  GLUCOSE 171*  --   --  185*  BUN 11  --   --  14  CREATININE 0.70   < > 0.50 0.81  CALCIUM 8.6*  --   --  8.2*   < > = values in this interval not displayed.   Liver Function Tests Recent Labs    10/28/19 1036  AST 24  ALT 18  ALKPHOS 80  BILITOT 0.8  PROT 6.7  ALBUMIN 2.9*   No results for input(s): LIPASE, AMYLASE in the last 72 hours. High Sensitivity Troponin:   Recent Labs  Lab 10/26/19 1050 10/26/19 1443  TROPONINIHS 43* 2,032*    BNP Invalid input(s): POCBNP D-Dimer No results for input(s): DDIMER in the last 72 hours. Hemoglobin A1C Recent Labs    10/27/19 0219  HGBA1C 11.7*   Fasting Lipid Panel Recent Labs    10/27/19 0219  CHOL 174  HDL 50  LDLCALC 103*  TRIG 104  CHOLHDL 3.5   Thyroid Function Tests No results for input(s): TSH, T4TOTAL, T3FREE, THYROIDAB in the last 72 hours.  Invalid input(s): FREET3 _____________  CARDIAC CATHETERIZATION  Result Date: 10/26/2019  Prox Cx to Mid Cx lesion is 30% stenosed.   Prox LAD to Mid LAD lesion is 10% stenosed.  Mid LAD lesion is 100% stenosed.  1st Diag-1 lesion is 80% stenosed.  1st Diag-2 lesion is 70% stenosed.  A drug-eluting stent was successfully placed using a SYNERGY XD 2.50X28.  Post intervention, there is a 0% residual stenosis.  1. Acute anterior STEMI secondary to occlusion of the mid LAD 2. Successful PTCA/DES x 1 mid LAD (overlapping with old stent) 3. 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 There is TIMI-3 flow down the Diagonal branch. 4. The Circumflex is a small caliber vessel with mild non-obstructive plaque 5. The RCA is a large dominant vessel with no obstructive disease Recommendations: Will admit to the ICU. Continue Aggrastat infusion for 2 hours. Will continue DAPT with ASA and Effient for one year. Will start a high intensity statin. Will start Lopressor 25 mg po BID. Will need to review home medications and decide the best way to add in an Ace-inh or ARB. She may do best with Coreg for better BP control. Echo in am. Pharmacy to assist with dosing of insulin. Aggressive risk factor modification.   ECHOCARDIOGRAM COMPLETE  Result Date: 10/26/2019    ECHOCARDIOGRAM REPORT   Patient Name:   Brenda [name] Date of Exam: 10/26/2019 Medical Rec #:  [id number]      Height:       62.0 in Accession #:    [id number]     Weight:       319.9 lb Date of Birth:  1960-10-31      BSA:          2.334 m Patient Age:    34 years       BP:           115/72 mmHg Patient Gender: F              HR:           84 bpm. Exam Location:  Inpatient Procedure: Intracardiac Opacification Agent Indications:    CAD Native Vessel 414.01 / I25.10  History:        Patient has prior history of Echocardiogram examinations, most                 recent 08/20/2016. CAD  and STEMI; Risk Factors:Sleep Apnea and                 Morbid obesity.  Sonographer:    Vikki Ports Turrentine Referring Phys: [place]  1. Left ventricular  ejection fraction, by estimation, is 60 to 65%. The left ventricle has normal function. The left ventricle has no regional wall motion abnormalities. There is mild concentric left ventricular hypertrophy. Left ventricular diastolic parameters are consistent with Grade I diastolic dysfunction (impaired relaxation). Elevated left ventricular end-diastolic pressure.  2. Right ventricular systolic function is normal. The right ventricular size is normal.  3. Left atrial size was mildly dilated.  4. The mitral valve is normal in structure and function. No evidence of mitral valve regurgitation. No evidence of mitral stenosis.  5. The aortic valve is normal in structure and function. Aortic valve regurgitation is not visualized. No aortic stenosis is present. FINDINGS  Left Ventricle: Left ventricular ejection fraction, by estimation, is 60 to 65%. The left ventricle has normal function. The left ventricle has no regional wall motion abnormalities. Definity contrast agent was given IV to delineate the left ventricular  endocardial borders. The left ventricular internal cavity size was normal in size. There is mild concentric left ventricular hypertrophy. Left ventricular diastolic parameters are consistent with Grade I diastolic dysfunction (impaired relaxation). Elevated left ventricular end-diastolic pressure. Right Ventricle: The right ventricular size is normal. No increase in right ventricular wall thickness. Right ventricular systolic function is normal. Left Atrium: Left atrial size was mildly dilated. Right Atrium: Right atrial size was normal in size. Pericardium: There is no evidence of pericardial effusion. Mitral Valve: The mitral valve is normal in structure and function. Normal mobility of the mitral valve leaflets. No evidence of mitral valve regurgitation. No evidence of mitral valve stenosis. Tricuspid Valve: The tricuspid valve is normal in structure. Tricuspid valve regurgitation is not demonstrated. No  evidence of tricuspid stenosis. Aortic Valve: The aortic valve is normal in structure and function. Aortic valve regurgitation is not visualized. No aortic stenosis is present. Aortic valve mean gradient measures 12.0 mmHg. Aortic valve peak gradient measures 21.5 mmHg. Aortic valve area, by VTI measures 1.48 cm. Pulmonic Valve: The pulmonic valve was normal in structure. Pulmonic valve regurgitation is trivial. No evidence of pulmonic stenosis. Aorta: The aortic root is normal in size and structure. Venous: The inferior vena cava was not well visualized. IAS/Shunts: No atrial level shunt detected by color flow Doppler.  LEFT VENTRICLE PLAX 2D LVIDd:         5.15 cm  Diastology LVIDs:         4.06 cm  LV e' lateral:   6.73 cm/s LV PW:         1.13 cm  LV E/e' lateral: 16.0 LV IVS:        1.12 cm LVOT diam:     2.00 cm LV SV:         71.00 ml LV SV Index:   30.42 LVOT Area:     3.14 cm  LEFT ATRIUM         Index LA diam:    4.50 cm 1.93 cm/m  AORTIC VALVE AV Area (Vmax):    1.50 cm AV Area (Vmean):   1.47 cm AV Area (VTI):     1.48 cm AV Vmax:           232.00 cm/s AV Vmean:          162.000 cm/s AV VTI:  0.480 m AV Peak Grad:      21.5 mmHg AV Mean Grad:      12.0 mmHg LVOT Vmax:         111.00 cm/s LVOT Vmean:        75.800 cm/s LVOT VTI:          0.226 m LVOT/AV VTI ratio: 0.47  AORTA Ao Root diam: 2.60 cm MITRAL VALVE MV Area (PHT): 2.87 cm     SHUNTS MV Decel Time: 264 msec     Systemic VTI:  0.23 m MV E velocity: 108.00 cm/s  Systemic Diam: 2.00 cm MV A velocity: 154.00 cm/s MV E/A ratio:  0.70 Fransico Him MD Electronically signed by Fransico Him MD Signature Date/Time: 10/26/2019/7:19:02 PM    Final    CT Angio Chest/Abd/Pel for Dissection W and/or Wo Contrast  Result Date: 10/26/2019 CLINICAL DATA:  Chest pain EXAM: CT ANGIOGRAPHY CHEST, ABDOMEN AND PELVIS TECHNIQUE: Multidetector CT imaging through the chest, abdomen and pelvis was performed using the standard protocol during bolus  administration of intravenous contrast. Multiplanar reconstructed images and MIPs were obtained and reviewed to evaluate the vascular anatomy. CONTRAST:  110mL OMNIPAQUE IOHEXOL 350 MG/ML SOLN COMPARISON:  None. FINDINGS: CTA CHEST FINDINGS Cardiovascular: Preferential opacification of the thoracic aorta. No evidence of thoracic aortic aneurysm or dissection. Normal heart size. No pericardial effusion. LAD atherosclerosis. Mediastinum/Nodes: No enlarged mediastinal, hilar, or axillary lymph nodes. Thyroid gland, trachea, and esophagus demonstrate no significant findings. Lungs/Pleura: Lungs are clear. No pleural effusion or pneumothorax. Musculoskeletal: No chest wall abnormality. No acute or significant osseous findings. Review of the MIP images confirms the above findings. CTA ABDOMEN AND PELVIS FINDINGS VASCULAR Aorta: Normal caliber aorta without aneurysm, dissection, vasculitis or significant stenosis. Celiac: Patent without evidence of aneurysm, dissection, vasculitis or significant stenosis. SMA: Patent without evidence of aneurysm, dissection, vasculitis or significant stenosis. Renals: Both renal arteries are patent without evidence of aneurysm, dissection, vasculitis, fibromuscular dysplasia or significant stenosis. IMA: Patent without evidence of aneurysm, dissection, vasculitis or significant stenosis. Inflow: Patent without evidence of aneurysm, dissection, vasculitis or significant stenosis. Veins: No obvious venous abnormality within the limitations of this arterial phase study. Review of the MIP images confirms the above findings. NON-VASCULAR Hepatobiliary: No focal liver abnormality is seen. Status post cholecystectomy. No biliary dilatation. Pancreas: Unremarkable. No pancreatic ductal dilatation or surrounding inflammatory changes. Spleen: Normal in size without focal abnormality. Adrenals/Urinary Tract: Adrenal glands are unremarkable. Kidneys are normal, without renal calculi, focal lesion, or  hydronephrosis. Bladder is unremarkable. Stomach/Bowel: Stomach is within normal limits. Appendix appears normal. No evidence of bowel wall thickening, distention, or inflammatory changes. Lymphatic: No lymphadenopathy Reproductive: Uterus and bilateral adnexa are unremarkable. Other: Wide-mouth fat containing umbilical hernia. Musculoskeletal: No acute osseous abnormality. No aggressive osseous lesion. Review of the MIP images confirms the above findings. IMPRESSION: 1. No aortic aneurysm or dissection. 2. LAD atherosclerosis. Electronically Signed   By: Kathreen Devoid   On: 10/26/2019 11:16   Disposition   Pt is being discharged home today in good condition.  Follow-up Plans & Appointments    Follow-up Information    [place], [place], [place] Follow up.   Specialty: Cardiology Why: [facility] -  November 06, 2019 at 2:15 PM. Please arrive 15 minutes early to check in. Vin is one of the PAs that works closely with Dr. Burt Knack and our cardiology team. Contact information: [address] [place] [address] [place],  Charlane Ferretti, MD. Daphane Shepherd on 11/05/2019.   Specialty: Family Medicine Why: f/u appointment at 10:30- this will be a virtual appointment  Contact information: [place] [address] [phone]          Discharge Instructions    Amb Referral to Cardiac Rehabilitation   Complete by: As directed    Diagnosis:  STEMI Coronary Stents     After initial evaluation and assessments completed: Virtual Based Care may be provided alone or in conjunction with Phase 2 Cardiac Rehab based on patient barriers.: Yes   Diet - low sodium heart healthy   Complete by: As directed    Discharge instructions   Complete by: As directed    You were started on several new medications due to your heart attack. Please make sure to review list carefully.  Your insulin regimen has also changed. The diabetes coordinator has recommended to discontinue the two  different insulins you were taking, and change to a combination insulin called Novolin twice a day. Please make sure you eat a meal with taking this.   Increase activity slowly   Complete by: As directed    No driving for 2 weeks. No lifting over 10 lbs for 4 weeks. No sexual activity for 4 weeks. Keep procedure site clean & dry. If you notice increased pain, swelling, bleeding or pus, call/return!  You may shower, but no soaking baths/hot tubs/pools for 1 week.      Discharge Medications   Allergies as of 10/28/2019      Reactions   Hydrocodone Shortness Of Breath   "I forget to breathe."    Clopidogrel Bisulfate    REACTION: rash   Hydrocodeine [dihydrocodeine]       Medication List    STOP taking these medications   insulin NPH Human 100 UNIT/ML injection Commonly known as: NOVOLIN N   insulin regular 100 units/mL injection Commonly known as: NOVOLIN R   Insulin Syringe-Needle U-100 31G X 15/64" 1 ML Misc Commonly known as: BD Insulin Syringe Ultrafine     TAKE these medications   aspirin EC 81 MG tablet Take 1 tablet (81 mg total) by mouth daily.   atorvastatin 80 MG tablet Commonly known as: LIPITOR Take 1 tablet (80 mg total) by mouth every evening.   glucose blood test strip Commonly known as: True Metrix Blood Glucose Test Use as instructed   Insulin Pen Needle 30G X 8 MM Misc Commonly known as: NOVOFINE Use as directed with Novolin 70/30 insulin   metoprolol tartrate 25 MG tablet Commonly known as: LOPRESSOR Take 1 tablet (25 mg total) by mouth 2 (two) times daily.   nitroGLYCERIN 0.4 MG SL tablet Commonly known as: NITROSTAT Place 1 tablet (0.4 mg total) under the tongue every 5 (five) minutes as needed for chest pain (up to 3 doses. If taking 3rd dose call 911). What changed: reasons to take this   NovoLIN 70/30 FlexPen (70-30) 100 UNIT/ML PEN Generic drug: Insulin Isophane & Regular Human Inject 50 Units into the skin 2 (two) times daily with a  meal.   ticagrelor 90 MG Tabs tablet Commonly known as: BRILINTA Take 1 tablet (90 mg total) by mouth 2 (two) times daily.   True Metrix Meter Devi 1 each by Does not apply route 3 (three) times daily.   TRUEplus Lancets 30G Misc 1 each by Does not apply route 3 (three) times daily.          Outstanding Labs/Studies   If the  patient is tolerating statin at time of follow-up appointment, would consider rechecking liver function/lipid panel in 6-8 weeks.   Duration of Discharge Encounter   Greater than 30 minutes including physician time.  Signed, Charlie Pitter, PA-C 10/28/2019, 12:23 PM

## 2019-10-28 NOTE — Telephone Encounter (Signed)
   Attention TOC pool,  This patient will need a TOC phone call after discharge. They are being discharged likely today. Follow-up appointment has already been arranged with: Robbie Lis on 3/5  They are a patient of Sherren Mocha, MD.  Thank you! Charlie Pitter PA-C

## 2019-10-28 NOTE — Telephone Encounter (Signed)
**Note De-Identified Brodie Scovell Obfuscation** We will monitor the pts chart and call her once she has been discharged from the hospital.

## 2019-10-28 NOTE — Progress Notes (Signed)
Inpatient Diabetes Program Recommendations  AACE/ADA: New Consensus Statement on Inpatient Glycemic Control (2015)  Target Ranges:  Prepandial:   less than 140 mg/dL      Peak postprandial:   less than 180 mg/dL (1-2 hours)      Critically ill patients:  140 - 180 mg/dL   Lab Results  Component Value Date   GLUCAP 94 10/28/2019   HGBA1C 11.7 (H) 10/27/2019    Review of Glycemic Control Results for Brenda [name], Brenda [name] (MRN [id number]) as of 10/28/2019 11:52  Ref. Range 10/27/2019 17:57 10/27/2019 21:23 10/28/2019 07:56  Glucose-Capillary Latest Ref Range: 70 - 99 mg/dL 446 (H) 299 (H) 94    Diabetes history: DM2 Outpatient Diabetes medications: Novolin 70 units am & hs + Novolin R 30 units tid Current orders for Inpatient glycemic control: Novolog N 40 units am & hs + Novolog resistant correction tid + hs 0-5 units  Inpatient Diabetes Program Recommendations:    Recommend Novolin 70/30 50 units BID for discharge [id number] Flexpen) and 30G pen needles [id number]). Please ensure patient takes with meal.   Thanks, Bronson Curb, MSN, RNC-OB Diabetes Coordinator [phone] (8a-5p)

## 2019-10-28 NOTE — Progress Notes (Addendum)
Progress Note  Patient Name: Brenda [name] Date of Encounter: 10/28/2019  Primary Cardiologist: Sherren Mocha, MD  Subjective   No CP or SOB. Feeling well. Will need rx for insulin going home as well.  Inpatient Medications    Scheduled Meds: . aspirin EC  81 mg Oral Daily  . atorvastatin  80 mg Oral q1800  . Chlorhexidine Gluconate Cloth  6 each Topical Daily  . insulin aspart  0-20 Units Subcutaneous TID WC  . insulin aspart  0-5 Units Subcutaneous QHS  . insulin NPH Human  40 Units Subcutaneous BID AC & HS  . metoprolol tartrate  25 mg Oral BID  . sodium chloride flush  3 mL Intravenous Q12H  . ticagrelor  90 mg Oral BID   Continuous Infusions: . sodium chloride     PRN Meds: sodium chloride, acetaminophen, nitroGLYCERIN, ondansetron (ZOFRAN) IV, sodium chloride flush   Vital Signs    Vitals:   10/27/19 1200 10/27/19 1421 10/27/19 2124 10/28/19 0504  BP: 132/83 (!) 146/73 126/70 135/89  Pulse: 67 65 (!) 55 (!) 58  Resp: 15 16 18 20   Temp:  97.8 F (36.6 C) (!) 97.5 F (36.4 C) 97.6 F (36.4 C)  TempSrc:  Oral Oral Oral  SpO2: 94% 96% 95% 96%  Weight:    (!) 140.7 kg  Height:        Intake/Output Summary (Last 24 hours) at 10/28/2019 0855 Last data filed at 10/27/2019 2000 Gross per 24 hour  Intake 360 ml  Output 250 ml  Net 110 ml   Last 3 Weights 10/28/2019 10/27/2019 10/26/2019  Weight (lbs) 310 lb 3.2 oz 319 lb 14.2 oz 319 lb 14.2 oz  Weight (kg) 140.706 kg 145.1 kg 145.1 kg     Telemetry    NSR - Personally Reviewed  ECG    NSR 68bpm RBBB - Personally Reviewed From Yesterday  Physical Exam   GEN: No acute distress, morbidly obese HEENT: Normocephalic, atraumatic, sclera non-icteric, hirsute Neck: No JVD or bruits. Cardiac: RRR no murmurs, rubs, or gallops.  Radials/DP/PT 1+ and equal bilaterally.  Respiratory: Clear to auscultation bilaterally. Breathing is unlabored. GI: Soft, nontender, non-distended, BS +x 4. MS: no  deformity. Extremities: No clubbing or cyanosis. No edema. Distal pedal pulses are 2+ and equal bilaterally. Left radial cath site without hematoma or ecchymosis; good pulse. Neuro:  AAOx3. Follows commands. Psych:  Responds to questions appropriately with a normal affect.  Labs    High Sensitivity Troponin:   Recent Labs  Lab 10/26/19 1050 10/26/19 1443  TROPONINIHS 43* 2,032*      Cardiac EnzymesNo results for input(s): TROPONINI in the last 168 hours. No results for input(s): TROPIPOC in the last 168 hours.   Chemistry Recent Labs  Lab 10/26/19 1050 10/26/19 1055 10/27/19 0219  NA 138  --  137  K 3.9  --  5.1  CL 101  --  101  CO2 24  --  25  GLUCOSE 171*  --  185*  BUN 11  --  14  CREATININE 0.70 0.50 0.81  CALCIUM 8.6*  --  8.2*  GFRNONAA >60  --  >60  GFRAA >60  --  >60  ANIONGAP 13  --  11     Hematology Recent Labs  Lab 10/26/19 1050 10/27/19 0219  WBC 9.1 10.7*  RBC 5.71* 5.29*  HGB 16.4* 15.4*  HCT 50.9* 48.3*  MCV 89.1 91.3  MCH 28.7 29.1  MCHC 32.2 31.9  RDW 12.8 13.3  PLT 268 302    BNPNo results for input(s): BNP, PROBNP in the last 168 hours.   DDimer No results for input(s): DDIMER in the last 168 hours.   Radiology    CARDIAC CATHETERIZATION  Result Date: 10/26/2019  Prox Cx to Mid Cx lesion is 30% stenosed.  Prox LAD to Mid LAD lesion is 10% stenosed.  Mid LAD lesion is 100% stenosed.  1st Diag-1 lesion is 80% stenosed.  1st Diag-2 lesion is 70% stenosed.  A drug-eluting stent was successfully placed using a SYNERGY XD 2.50X28.  Post intervention, there is a 0% residual stenosis.  1. Acute anterior STEMI secondary to occlusion of the mid LAD 2. Successful PTCA/DES x 1 mid LAD (overlapping with old stent) 3. 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 There is TIMI-3 flow down the Diagonal branch. 4. The Circumflex is a small caliber vessel with mild non-obstructive plaque 5. The RCA is a large dominant  vessel with no obstructive disease Recommendations: Will admit to the ICU. Continue Aggrastat infusion for 2 hours. Will continue DAPT with ASA and Effient for one year. Will start a high intensity statin. Will start Lopressor 25 mg po BID. Will need to review home medications and decide the best way to add in an Ace-inh or ARB. She may do best with Coreg for better BP control. Echo in am. Pharmacy to assist with dosing of insulin. Aggressive risk factor modification.   ECHOCARDIOGRAM COMPLETE  Result Date: 10/26/2019    ECHOCARDIOGRAM REPORT   Patient Name:   Brenda [name] Date of Exam: 10/26/2019 Medical Rec #:  [id number]      Height:       62.0 in Accession #:    [id number]     Weight:       319.9 lb Date of Birth:  April 12, 1961      BSA:          2.334 m Patient Age:    59 years       BP:           115/72 mmHg Patient Gender: F              HR:           84 bpm. Exam Location:  Inpatient Procedure: Intracardiac Opacification Agent Indications:    CAD Native Vessel 414.01 / I25.10  History:        Patient has prior history of Echocardiogram examinations, most                 recent 08/20/2016. CAD and STEMI; Risk Factors:Sleep Apnea and                 Morbid obesity.  Sonographer:    Vikki Ports Turrentine Referring Phys: [place]  1. Left ventricular ejection fraction, by estimation, is 60 to 65%. The left ventricle has normal function. The left ventricle has no regional wall motion abnormalities. There is mild concentric left ventricular hypertrophy. Left ventricular diastolic parameters are consistent with Grade I diastolic dysfunction (impaired relaxation). Elevated left ventricular end-diastolic pressure.  2. Right ventricular systolic function is normal. The right ventricular size is normal.  3. Left atrial size was mildly dilated.  4. The mitral valve is normal in structure and function. No evidence of mitral valve regurgitation. No evidence of mitral stenosis.  5. The aortic  valve is normal in structure and function. Aortic valve regurgitation is not visualized. No aortic stenosis is  present. FINDINGS  Left Ventricle: Left ventricular ejection fraction, by estimation, is 60 to 65%. The left ventricle has normal function. The left ventricle has no regional wall motion abnormalities. Definity contrast agent was given IV to delineate the left ventricular  endocardial borders. The left ventricular internal cavity size was normal in size. There is mild concentric left ventricular hypertrophy. Left ventricular diastolic parameters are consistent with Grade I diastolic dysfunction (impaired relaxation). Elevated left ventricular end-diastolic pressure. Right Ventricle: The right ventricular size is normal. No increase in right ventricular wall thickness. Right ventricular systolic function is normal. Left Atrium: Left atrial size was mildly dilated. Right Atrium: Right atrial size was normal in size. Pericardium: There is no evidence of pericardial effusion. Mitral Valve: The mitral valve is normal in structure and function. Normal mobility of the mitral valve leaflets. No evidence of mitral valve regurgitation. No evidence of mitral valve stenosis. Tricuspid Valve: The tricuspid valve is normal in structure. Tricuspid valve regurgitation is not demonstrated. No evidence of tricuspid stenosis. Aortic Valve: The aortic valve is normal in structure and function. Aortic valve regurgitation is not visualized. No aortic stenosis is present. Aortic valve mean gradient measures 12.0 mmHg. Aortic valve peak gradient measures 21.5 mmHg. Aortic valve area, by VTI measures 1.48 cm. Pulmonic Valve: The pulmonic valve was normal in structure. Pulmonic valve regurgitation is trivial. No evidence of pulmonic stenosis. Aorta: The aortic root is normal in size and structure. Venous: The inferior vena cava was not well visualized. IAS/Shunts: No atrial level shunt detected by color flow Doppler.  LEFT  VENTRICLE PLAX 2D LVIDd:         5.15 cm  Diastology LVIDs:         4.06 cm  LV e' lateral:   6.73 cm/s LV PW:         1.13 cm  LV E/e' lateral: 16.0 LV IVS:        1.12 cm LVOT diam:     2.00 cm LV SV:         71.00 ml LV SV Index:   30.42 LVOT Area:     3.14 cm  LEFT ATRIUM         Index LA diam:    4.50 cm 1.93 cm/m  AORTIC VALVE AV Area (Vmax):    1.50 cm AV Area (Vmean):   1.47 cm AV Area (VTI):     1.48 cm AV Vmax:           232.00 cm/s AV Vmean:          162.000 cm/s AV VTI:            0.480 m AV Peak Grad:      21.5 mmHg AV Mean Grad:      12.0 mmHg LVOT Vmax:         111.00 cm/s LVOT Vmean:        75.800 cm/s LVOT VTI:          0.226 m LVOT/AV VTI ratio: 0.47  AORTA Ao Root diam: 2.60 cm MITRAL VALVE MV Area (PHT): 2.87 cm     SHUNTS MV Decel Time: 264 msec     Systemic VTI:  0.23 m MV E velocity: 108.00 cm/s  Systemic Diam: 2.00 cm MV A velocity: 154.00 cm/s MV E/A ratio:  0.70 Fransico Him MD Electronically signed by Fransico Him MD Signature Date/Time: 10/26/2019/7:19:02 PM    Final    CT Angio Chest/Abd/Pel for Dissection W and/or Wo Contrast  Result Date: 10/26/2019 CLINICAL DATA:  Chest pain EXAM: CT ANGIOGRAPHY CHEST, ABDOMEN AND PELVIS TECHNIQUE: Multidetector CT imaging through the chest, abdomen and pelvis was performed using the standard protocol during bolus administration of intravenous contrast. Multiplanar reconstructed images and MIPs were obtained and reviewed to evaluate the vascular anatomy. CONTRAST:  146mL OMNIPAQUE IOHEXOL 350 MG/ML SOLN COMPARISON:  None. FINDINGS: CTA CHEST FINDINGS Cardiovascular: Preferential opacification of the thoracic aorta. No evidence of thoracic aortic aneurysm or dissection. Normal heart size. No pericardial effusion. LAD atherosclerosis. Mediastinum/Nodes: No enlarged mediastinal, hilar, or axillary lymph nodes. Thyroid gland, trachea, and esophagus demonstrate no significant findings. Lungs/Pleura: Lungs are clear. No pleural effusion or  pneumothorax. Musculoskeletal: No chest wall abnormality. No acute or significant osseous findings. Review of the MIP images confirms the above findings. CTA ABDOMEN AND PELVIS FINDINGS VASCULAR Aorta: Normal caliber aorta without aneurysm, dissection, vasculitis or significant stenosis. Celiac: Patent without evidence of aneurysm, dissection, vasculitis or significant stenosis. SMA: Patent without evidence of aneurysm, dissection, vasculitis or significant stenosis. Renals: Both renal arteries are patent without evidence of aneurysm, dissection, vasculitis, fibromuscular dysplasia or significant stenosis. IMA: Patent without evidence of aneurysm, dissection, vasculitis or significant stenosis. Inflow: Patent without evidence of aneurysm, dissection, vasculitis or significant stenosis. Veins: No obvious venous abnormality within the limitations of this arterial phase study. Review of the MIP images confirms the above findings. NON-VASCULAR Hepatobiliary: No focal liver abnormality is seen. Status post cholecystectomy. No biliary dilatation. Pancreas: Unremarkable. No pancreatic ductal dilatation or surrounding inflammatory changes. Spleen: Normal in size without focal abnormality. Adrenals/Urinary Tract: Adrenal glands are unremarkable. Kidneys are normal, without renal calculi, focal lesion, or hydronephrosis. Bladder is unremarkable. Stomach/Bowel: Stomach is within normal limits. Appendix appears normal. No evidence of bowel wall thickening, distention, or inflammatory changes. Lymphatic: No lymphadenopathy Reproductive: Uterus and bilateral adnexa are unremarkable. Other: Wide-mouth fat containing umbilical hernia. Musculoskeletal: No acute osseous abnormality. No aggressive osseous lesion. Review of the MIP images confirms the above findings. IMPRESSION: 1. No aortic aneurysm or dissection. 2. LAD atherosclerosis. Electronically Signed   By: Kathreen Devoid   On: 10/26/2019 11:16    Cardiac Studies   2d echo  10/26/19  1. Left ventricular ejection fraction, by estimation, is 60 to 65%. The  left ventricle has normal function. The left ventricle has no regional  wall motion abnormalities. There is mild concentric left ventricular  hypertrophy. Left ventricular diastolic  parameters are consistent with Grade I diastolic dysfunction (impaired  relaxation). Elevated left ventricular end-diastolic pressure.  2. Right ventricular systolic function is normal. The right ventricular  size is normal.  3. Left atrial size was mildly dilated.  4. The mitral valve is normal in structure and function. No evidence of  mitral valve regurgitation. No evidence of mitral stenosis.  5. The aortic valve is normal in structure and function. Aortic valve  regurgitation is not visualized. No aortic stenosis is present.   LHC 10/26/19  Prox Cx to Mid Cx lesion is 30% stenosed.  Prox LAD to Mid LAD lesion is 10% stenosed.  Mid LAD lesion is 100% stenosed.  1st Diag-1 lesion is 80% stenosed.  1st Diag-2 lesion is 70% stenosed.  A drug-eluting stent was successfully placed using a SYNERGY XD 2.50X28.  Post intervention, there is a 0% residual stenosis.   1. Acute anterior STEMI secondary to occlusion of the mid LAD 2. Successful PTCA/DES x 1 mid LAD (overlapping with old stent) 3. 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 There is TIMI-3 flow down the Diagonal branch.  4. The Circumflex is a small caliber vessel with mild non-obstructive plaque 5. The RCA is a large dominant vessel with no obstructive disease  Recommendations: Will admit to the ICU. Continue Aggrastat infusion for 2 hours. Will continue DAPT with ASA and Effient for one year. Will start a high intensity statin. Will start Lopressor 25 mg po BID. Will need to review home medications and decide the best way to add in an Ace-inh or ARB. She may do best with Coreg for better BP control. Echo in am. Pharmacy to  assist with dosing of insulin. Aggressive risk factor modification.    Patient Profile     59 y.o. female with history of CAD with NSTEMI s/p complex LAD/diagonal bifurcation PCI in 2010, uncontrolled DM, medication nonadherence in context of losing insurance, morbid obesity, OSA who presented with chest pain and found to have anterior STEMI. She underwent emergent heart cath and had successful stent placement to a 100% mid LAD occlusion (with overlap with old stent).  She was also markedly hypertensive.  Assessment & Plan    1. Anterior STEMI with history of CAD as above, s/p mid LAD DES 10/26/19 - now on ASA, BB, statin, Brilinta. Seen by CM with plan for [facility] card completion. Will need appt at [facility] by care manager, wrote care order for nurse to touch base with the on-call CM to help arrange. Anticipate probable DC today.  2. Accelerated HTN present on admission - CT angio r/o dissection on admission. BP improved post cath, maintaining SBP 120-140s on metoprolol. Can consider ACEI/ARB given DM as OP if potassium comes back to more normal range - currently upper limits of normal so would not start acutely.  3. Uncontrolled DM (Type 2 diabetes mellitus with complication, with long-term current use of insulin ([place]))- A1C 11.7. Outpatient regimen was NPH 70 units BID, and regular insulin 30u TID. Seen by diabetes coordinator, no specific changes recommended aside from dietary and primary care follow-up. Will discuss dc plan with MD. Plan to send meds to [place].  4. Hyperlipidemia with goal LDL <70 - LDL 103 on profile here. Statin started. Will add baseline LFTs. If the patient is tolerating statin at time of follow-up appointment, would consider rechecking liver function/lipid panel in 6-8 weeks.  5. Elevated Hgb in 15-16 range - will need OP f/u primary care for this.  6. Morbid obesity with OSA - will need lifestyle modification as OP given that severe obesity will be major crux of  issues going forward.  For questions or updates, please contact [place] Please consult [email or web address] for contact info under Cardiology/STEMI.  Signed, Charlie Pitter, PA-C 10/28/2019, 8:55 AM

## 2019-10-29 NOTE — Telephone Encounter (Signed)
**Note De-Identified Brenda [name] Obfuscation** TCM Call 1st Attempt: No answer and no way to leave a message as the pts voice mailbox is full at the only number we have list to reach the pt at.  Will try again later.

## 2019-10-30 NOTE — Telephone Encounter (Signed)
**Note De-Identified [name] Obfuscation** TCM CALL 2nd attempt. No ans and no way to leave a message on the pts VM as it is full.  I then called the 2 phone numbers we have listed for her emergency contacts: 1. Husband- Brenda [name]: A man answered my call but stated that I dialed the wrong number. I did verify that I called the number listed as [place] in the pts chart.  2. Daughter-Brenda [name]: Barnetta Chapel did answer my call and she states that she will ask the pt to call me back when she takes her dinner to her tonight so it may be Monday before she calls me back. I did provide Barnetta Chapel with the office phone number and my name so the pt can call me back.

## 2019-11-02 ENCOUNTER — Encounter (HOSPITAL_COMMUNITY): Payer: Self-pay | Admitting: *Deleted

## 2019-11-02 NOTE — Telephone Encounter (Signed)
The pt called the office this morning and gave me her new phone number of [phone]. I have updated this info in her chart.  The pt confirms that she is feeling well at this time and is without CP, SOB, dizziness or headaches. She does have the office phone number to call if she has any questions or concerns.

## 2019-11-02 NOTE — Progress Notes (Addendum)
Referral received and verified for MD signature. Follow up appt is 11/06/19. Pt is eligible to participate in virtual cardiac rehab.  However if this pt is able to secure medical insurance can then consider onsite cardiac rehab. Cherre Huger, BSN Cardiac and Training and development officer

## 2019-11-04 NOTE — Progress Notes (Deleted)
Cardiology Office Note    Date:  11/04/2019   ID:  Dorean, Carl October 07, 1960, MRN [id number]  PCP:  Charlott Rakes, MD  Cardiologist: Dr. Burt Knack  Chief Complaint: Hospital follow up   History of Present Illness:   Brenda [name] is a 59 y.o. female with CADs/p complex LAD/diagonal bifurcation PCI in 2010, uncontrolled DM, medication nonadherence in context of losing insurance, morbid obesity and OSA presents for hospital follow up.   Admitted 10/2019 with anterior STEMI and hypertensive urgency. Cath showed occlusion of the mid LAD as the culprit. She was treated with PTCA/DES x1 overlapping the old stent. 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 There is TIMI-3 flow down the Diagonal branch.  CT angio ruled out aortic dissection on admission. Medication assistant provided. Plan for ARB as outpatient.   Here today for follow up.     Past Medical History:  Diagnosis Date  . Arthritis    "back, hands, hips" (02/22/2015)  . CAD (coronary artery disease)    a. complex LAD/diagonal bifurcation PCI in 2010. b. STEMI 10/2019 s/p PTCA/DES x1 to mLAD overlapping the old stent, residual disease treated medicaly.  . Depression   . Diabetes mellitus ([place])    "started when I was pregnant; not sure if it was type 1 or type 2"   . Hypercholesterolemia   . Hypertension   . Morbid obesity ([place])   . Sleep apnea    "suppose to wear mask; I don't" (02/22/2015)    Past Surgical History:  Procedure Laterality Date  . CARDIAC CATHETERIZATION  12/2012  . CHOLECYSTECTOMY OPEN  1989  . CORONARY ANGIOPLASTY WITH STENT PLACEMENT  2011  . CORONARY/GRAFT ACUTE MI REVASCULARIZATION N/A 10/26/2019   Procedure: CORONARY/GRAFT ACUTE MI REVASCULARIZATION;  Surgeon: Burnell Blanks, MD;  Location: [facility];  Service: Cardiovascular;  Laterality: N/A;  . HERNIA REPAIR    . LEFT HEART CATH AND CORONARY ANGIOGRAPHY N/A 10/26/2019   Procedure: LEFT  HEART CATH AND CORONARY ANGIOGRAPHY;  Surgeon: Burnell Blanks, MD;  Location: [facility];  Service: Cardiovascular;  Laterality: N/A;  . LEFT HEART CATHETERIZATION WITH CORONARY ANGIOGRAM N/A 12/25/2012   Procedure: LEFT HEART CATHETERIZATION WITH CORONARY ANGIOGRAM;  Surgeon: Sherren Mocha, MD;  [facility] CATH [facility];  Service: Cardiovascular;  Laterality: N/A;  . UMBILICAL HERNIA REPAIR  1990's    Current Medications: Prior to Admission medications   Medication Sig Start Date End Date Taking? Authorizing Provider  aspirin EC 81 MG tablet Take 1 tablet (81 mg total) by mouth daily. 10/28/19   Dunn, Nedra Hai, PA-C  atorvastatin (LIPITOR) 80 MG tablet Take 1 tablet (80 mg total) by mouth every evening. 10/28/19   Dunn, Nedra Hai, PA-C  Blood Glucose Monitoring Suppl (TRUE METRIX METER) DEVI 1 each by Does not apply route 3 (three) times daily. 02/28/15   Charlott Rakes, MD  glucose blood (TRUE METRIX BLOOD GLUCOSE TEST) test strip Use as instructed 02/28/15   Charlott Rakes, MD  Insulin Isophane & Regular Human (NOVOLIN 70/30 FLEXPEN) (70-30) 100 UNIT/ML PEN Inject 50 Units into the skin 2 (two) times daily with a meal. 10/28/19   Dunn, Nedra Hai, PA-C  Insulin Pen Needle (NOVOFINE) 30G X 8 MM MISC Use as directed with Novolin 70/30 insulin 10/28/19   Dunn, Dayna N, PA-C  metoprolol tartrate (LOPRESSOR) 25 MG tablet Take 1 tablet (25 mg total) by mouth 2 (two) times daily. 10/28/19  Dunn, Dayna N, PA-C  nitroGLYCERIN (NITROSTAT) 0.4 MG SL tablet Place 1 tablet (0.4 mg total) under the tongue every 5 (five) minutes as needed for chest pain (up to 3 doses. If taking 3rd dose call 911). 10/28/19   Charlie Pitter, PA-C  ticagrelor (BRILINTA) 90 MG TABS tablet Take 1 tablet (90 mg total) by mouth 2 (two) times daily. 10/28/19   Dunn, Nedra Hai, PA-C  TRUEPLUS LANCETS 30G MISC 1 each by Does not apply route 3 (three) times daily. 02/28/15   Charlott Rakes, MD    Allergies:   Hydrocodone, Clopidogrel  bisulfate, and Hydrocodeine [dihydrocodeine]   Social History   Socioeconomic History  . Marital status: Married    Spouse name: Not on file  . Number of children: 2  . Years of education: Not on file  . Highest education level: Not on file  Occupational History  . Occupation: Housewife  Tobacco Use  . Smoking status: Former Smoker    Packs/day: 0.50    Years: 1.00    Pack years: 0.50    Types: Cigarettes, Cigars    Quit date: 09/03/1986    Years since quitting: 33.1  . Smokeless tobacco: Never Used  Substance and Sexual Activity  . Alcohol use: No  . Drug use: No  . Sexual activity: Not Currently  Other Topics Concern  . Not on file  Social History Narrative  . Not on file   Social Determinants of Health   Financial Resource Strain:   . Difficulty of Paying Living Expenses: Not on file  Food Insecurity:   . Worried About Charity fundraiser in the Last Year: Not on file  . Ran Out of Food in the Last Year: Not on file  Transportation Needs:   . Lack of Transportation (Medical): Not on file  . Lack of Transportation (Non-Medical): Not on file  Physical Activity:   . Days of Exercise per Week: Not on file  . Minutes of Exercise per Session: Not on file  Stress:   . Feeling of Stress : Not on file  Social Connections:   . Frequency of Communication with Friends and Family: Not on file  . Frequency of Social Gatherings with Friends and Family: Not on file  . Attends Religious Services: Not on file  . Active Member of Clubs or Organizations: Not on file  . Attends Archivist Meetings: Not on file  . Marital Status: Not on file     Family History:  The patient's family history includes COPD in her father; Cancer in her mother; Coronary artery disease in her mother and sister; Diabetes in her sister; Heart attack in her father; Hypertension in her mother; Stroke in her sister. ***  ROS:   Please see the history of present illness.    ROS All other systems  reviewed and are negative.   PHYSICAL EXAM:   VS:  There were no vitals taken for this visit.   GEN: Well nourished, well developed, in no acute distress  HEENT: normal  Neck: no JVD, carotid bruits, or masses Cardiac: ***RRR; no murmurs, rubs, or gallops,no edema  Respiratory:  clear to auscultation bilaterally, normal work of breathing GI: soft, nontender, nondistended, + BS MS: no deformity or atrophy  Skin: warm and dry, no rash Neuro:  Alert and Oriented x 3, Strength and sensation are intact Psych: euthymic mood, full affect  Wt Readings from Last 3 Encounters:  10/28/19 (!) 310 lb 3.2 oz (140.7 kg)  08/16/16 270 lb 6.4 oz (122.7 kg)  05/31/16 290 lb (131.5 kg)      Studies/Labs Reviewed:   EKG:  EKG is ordered today.  The ekg ordered today demonstrates ***  Recent Labs: 10/27/2019: BUN 14; Creatinine, Ser 0.81; Hemoglobin 15.4; Platelets 302; Potassium 5.1; Sodium 137 10/28/2019: ALT 18   Lipid Panel    Component Value Date/Time   CHOL 174 10/27/2019 0219   TRIG 104 10/27/2019 0219   HDL 50 10/27/2019 0219   CHOLHDL 3.5 10/27/2019 0219   VLDL 21 10/27/2019 0219   LDLCALC 103 (H) 10/27/2019 0219   LDLDIRECT 135.9 02/22/2011 1132    Additional studies/ records that were reviewed today include:   2D echo 10/26/19 1. Left ventricular ejection fraction, by estimation, is 60 to 65%. The  left ventricle has normal function. The left ventricle has no regional  wall motion abnormalities. There is mild concentric left ventricular  hypertrophy. Left ventricular diastolic  parameters are consistent with Grade I diastolic dysfunction (impaired  relaxation). Elevated left ventricular end-diastolic pressure.  2. Right ventricular systolic function is normal. The right ventricular  size is normal.  3. Left atrial size was mildly dilated.  4. The mitral valve is normal in structure and function. No evidence of  mitral valve regurgitation. No evidence of mitral stenosis.   5. The aortic valve is normal in structure and function. Aortic valve  regurgitation is not visualized. No aortic stenosis is present.  LHC 10/26/19  Prox Cx to Mid Cx lesion is 30% stenosed.  Prox LAD to Mid LAD lesion is 10% stenosed.  Mid LAD lesion is 100% stenosed.  1st Diag-1 lesion is 80% stenosed.  1st Diag-2 lesion is 70% stenosed.  A drug-eluting stent was successfully placed using a SYNERGY XD 2.50X28.  Post intervention, there is a 0% residual stenosis.  1. Acute anterior STEMI secondary to occlusion of the mid LAD 2. Successful PTCA/DES x 1 mid LAD (overlapping with old stent) 3. 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 There is TIMI-3 flow down the Diagonal branch.  4. The Circumflex is a small caliber vessel with mild non-obstructive plaque 5. The RCA is a large dominant vessel with no obstructive disease  Recommendations: Will admit to the ICU. Continue Aggrastat infusion for 2 hours. Will continue DAPT with ASA and Effient for one year. Will start a high intensity statin. Will start Lopressor 25 mg po BID. Will need to review home medications and decide the best way to add in an Ace-inh or ARB. She may do best with Coreg for better BP control. Echo in am. Pharmacy to assist with dosing of insulin. Aggressive risk factor modification.   Diagnostic Dominance: Right  Intervention      ASSESSMENT & PLAN:    1. ***    Medication Adjustments/Labs and Tests Ordered: Current medicines are reviewed at length with the patient today.  Concerns regarding medicines are outlined above.  Medication changes, Labs and Tests ordered today are listed in the Patient Instructions below. There are no Patient Instructions on file for this visit.   Jarrett Soho, [place]  11/04/2019 7:30 PM    [address], [place], [place]  [address] Phone: [phone]; Fax: [phone]

## 2019-11-05 ENCOUNTER — Ambulatory Visit: Payer: Self-pay | Attending: Family Medicine | Admitting: Family Medicine

## 2019-11-05 ENCOUNTER — Other Ambulatory Visit: Payer: Self-pay

## 2019-11-05 DIAGNOSIS — I2102 ST elevation (STEMI) myocardial infarction involving left anterior descending coronary artery: Secondary | ICD-10-CM

## 2019-11-05 DIAGNOSIS — Z794 Long term (current) use of insulin: Secondary | ICD-10-CM

## 2019-11-05 DIAGNOSIS — E785 Hyperlipidemia, unspecified: Secondary | ICD-10-CM

## 2019-11-05 DIAGNOSIS — I252 Old myocardial infarction: Secondary | ICD-10-CM

## 2019-11-05 DIAGNOSIS — E1169 Type 2 diabetes mellitus with other specified complication: Secondary | ICD-10-CM

## 2019-11-05 DIAGNOSIS — E118 Type 2 diabetes mellitus with unspecified complications: Secondary | ICD-10-CM

## 2019-11-05 MED ORDER — NOVOLIN 70/30 FLEXPEN (70-30) 100 UNIT/ML ~~LOC~~ SUPN: PEN_INJECTOR | SUBCUTANEOUS | 6 refills | Status: DC

## 2019-11-05 NOTE — Progress Notes (Signed)
Virtual Visit via Telephone Note  I connected with Brenda [name], on 11/05/2019 at 11:23 AM by telephone due to the COVID-19 pandemic and verified that I am speaking with the correct person using two identifiers.   Consent: I discussed the limitations, risks, security and privacy concerns of performing an evaluation and management service by telephone and the availability of in person appointments. I also discussed with the patient that there may be a patient responsible charge related to this service. The patient expressed understanding and agreed to proceed.   Location of Patient: Home  Location of Provider: Clinic   Persons participating in Telemedicine visit: Gloristine Kosta Farrington-CMA Dr. Margarita Rana     History of Present Illness: Brenda [name] is a 59 year old female with a history of type 2 diabetes mellitus (A1c 11.7), obstructive sleep apnea, hypertension, CAD status post stent to LAD in 2010, recent hospitalization from 10/26/2019 through 10/28/2019 for STEMI. Last seen in the clinic in 08/2016; states she had a hard time getting an appointment and just gave up and stopped taking her medications.   She underwent a cardiac cath which revealed 100% stenosis of mid LAD for which she underwent successful PTCA/DES of LAD overlapping with old stent. Cardiac cath report below:  Prox Cx to Mid Cx lesion is 30% stenosed.  Prox LAD to Mid LAD lesion is 10% stenosed.  Mid LAD lesion is 100% stenosed.  1st Diag-1 lesion is 80% stenosed.  1st Diag-2 lesion is 70% stenosed.  A drug-eluting stent was successfully placed using a SYNERGY XD 2.50X28.  Post intervention, there is a 0% residual stenosis.   1. Acute anterior STEMI secondary to occlusion of the mid LAD 2. Successful PTCA/DES x 1 mid LAD (overlapping with old stent) 3. 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 There is TIMI-3 flow down the Diagonal branch.  4. The  Circumflex is a small caliber vessel with mild non-obstructive plaque 5. The RCA is a large dominant vessel with no obstructive disease  Dual antiplatelet therapy with aspirin and Effient continued along with a statin and metoprolol.  Echo revealed: IMPRESSIONS    1. Left ventricular ejection fraction, by estimation, is 60 to 65%. The  left ventricle has normal function. The left ventricle has no regional  wall motion abnormalities. There is mild concentric left ventricular  hypertrophy. Left ventricular diastolic  parameters are consistent with Grade I diastolic dysfunction (impaired  relaxation). Elevated left ventricular end-diastolic pressure.  2. Right ventricular systolic function is normal. The right ventricular  size is normal.  3. Left atrial size was mildly dilated.  4. The mitral valve is normal in structure and function. No evidence of  mitral valve regurgitation. No evidence of mitral stenosis.  5. The aortic valve is normal in structure and function. Aortic valve  regurgitation is not visualized. No aortic stenosis is present.    Appt with Cardiology comes up tomorrow at 2:15pm. She is a bit dyspneic due to her weight and from wearing a mask. She denies presence of chest pain, feels better and can move around and doing things. She describes this "360 degrees turn around".  She lost her sister last year and she has custody of her nephew and she states she is somewhat better with regards to her stress level and is getting there. Her blood sugars at dinner have been 200-350; fasting sugars have been 89-116, this morning it was 63. Denies the presence of visual concerns, numbness in extremities.  Past Medical History:  Diagnosis Date  . Arthritis    "back, hands, hips" (02/22/2015)  . CAD (coronary artery disease)    a. complex LAD/diagonal bifurcation PCI in 2010. b. STEMI 10/2019 s/p PTCA/DES x1 to mLAD overlapping the old stent, residual disease treated medicaly.  .  Depression   . Diabetes mellitus ([place])    "started when I was pregnant; not sure if it was type 1 or type 2"   . Hypercholesterolemia   . Hypertension   . Morbid obesity ([place])   . Sleep apnea    "suppose to wear mask; I don't" (02/22/2015)   Allergies  Allergen Reactions  . Hydrocodone Shortness Of Breath    "I forget to breathe."   . Clopidogrel Bisulfate     REACTION: rash  . Hydrocodeine [Dihydrocodeine]     Current Outpatient Medications on File Prior to Visit  Medication Sig Dispense Refill  . aspirin EC 81 MG tablet Take 1 tablet (81 mg total) by mouth daily. 30 tablet 11  . atorvastatin (LIPITOR) 80 MG tablet Take 1 tablet (80 mg total) by mouth every evening. 30 tablet 6  . Blood Glucose Monitoring Suppl (TRUE METRIX METER) DEVI 1 each by Does not apply route 3 (three) times daily. 1 Device 0  . glucose blood (TRUE METRIX BLOOD GLUCOSE TEST) test strip Use as instructed 100 each 5  . Insulin Isophane & Regular Human (NOVOLIN 70/30 FLEXPEN) (70-30) 100 UNIT/ML PEN Inject 50 Units into the skin 2 (two) times daily with a meal. 30 mL 1  . Insulin Pen Needle (NOVOFINE) 30G X 8 MM MISC Use as directed with Novolin 70/30 insulin 100 each 1  . metoprolol tartrate (LOPRESSOR) 25 MG tablet Take 1 tablet (25 mg total) by mouth 2 (two) times daily. 60 tablet 6  . nitroGLYCERIN (NITROSTAT) 0.4 MG SL tablet Place 1 tablet (0.4 mg total) under the tongue every 5 (five) minutes as needed for chest pain (up to 3 doses. If taking 3rd dose call 911). 25 tablet 3  . ticagrelor (BRILINTA) 90 MG TABS tablet Take 1 tablet (90 mg total) by mouth 2 (two) times daily. 60 tablet 11  . TRUEPLUS LANCETS 30G MISC 1 each by Does not apply route 3 (three) times daily. 100 each 5   No current facility-administered medications on file prior to visit.    Observations/Objective: Awake, alert, x3 Not in acute distress  Lab Results  Component Value Date   HGBA1C 11.7 (H) 10/27/2019    Assessment and  Plan: 1. Acute ST elevation myocardial infarction (STEMI) due to occlusion of left anterior descending (LAD) coronary artery (HCC) Status post overlapping DES to LAD Continue dual antiplatelet therapy with Effient and aspirin for 1 year Continue high intensity statin Risk factor modification including glycemic control and blood pressure control  2. Type 2 diabetes mellitus with complication, with long-term current use of insulin (HCC) Uncontrolled with A1c of 11.7 Increased morning dose of Novolin 70/30 to 55 units and continue evening dose of 50 units.  She has been educated on uptitrating her dose by 2 units every 4th day until blood sugars are at goal. Counseled on Diabetic diet, my plate method, [id number] minutes of moderate intensity exercise/week Blood sugar logs with fasting goals of 80-120 mg/dl, random of less than 180 and in the event of sugars less than 60 mg/dl or greater than 400 mg/dl encouraged to notify the clinic. Advised on the need for annual eye exams, annual foot exams, Pneumonia vaccine. -  insulin isophane & regular human (NOVOLIN 70/30 FLEXPEN) (70-30) 100 UNIT/ML KwikPen; Inject subcutaneously twice daily 55 units in the morning and 50 units in the evening  Dispense: 30 mL; Refill: 6  3. Hyperlipidemia associated with type 2 diabetes mellitus ([place]) Continues statin Low-cholesterol diet and exercise   Follow Up Instructions: Return in about 3 months (around 02/05/2020) for Chronic medical condition.    I discussed the assessment and treatment plan with the patient. The patient was provided an opportunity to ask questions and all were answered. The patient agreed with the plan and demonstrated an understanding of the instructions.   The patient was advised to call back or seek an in-person evaluation if the symptoms worsen or if the condition fails to improve as anticipated.     I provided 17 minutes total of non-face-to-face time during this encounter including median  intraservice time, reviewing previous notes, investigations, ordering medications, medical decision making, coordinating care and patient verbalized understanding at the end of the visit.     Charlott Rakes, MD, FAAFP. [facility] and [place], [place]   11/05/2019, 11:23 AM

## 2019-11-05 NOTE — Progress Notes (Signed)
Patient has been called and DOB has been verified. Patient has been screened and transferred to PCP to start phone visit.     

## 2019-11-06 ENCOUNTER — Ambulatory Visit: Payer: Self-pay | Admitting: Physician Assistant

## 2019-11-06 ENCOUNTER — Encounter: Payer: Self-pay | Admitting: Family Medicine

## 2019-11-30 NOTE — Progress Notes (Addendum)
Cardiology Office Note:    Date:  12/01/2019   ID:  Amyna Kowall, DOB August 28, 1961, MRN [id number]  PCP:  Charlott Rakes, MD  Cardiologist:  Sherren Mocha, MD   Electrophysiologist:  None   Referring MD: Charlott Rakes, MD   Chief Complaint:  Hospitalization Follow-up (s/p MI >> PCI to LAD)    Patient Profile:    Brenda [name] is a 60 y.o. female with:   Coronary artery disease   S/p non-STEMI in 2000 tx w/ complex bifurcational LAD/Dx stenting  Allergic to Plavix  Cath 4/14: mod ISR in LAD and severe ISR in Dx >> med Rx  S/p anterior STEMI 10/2019 >> Rx with DES to LAD (overlapping with old stent); chronic ost Dx stenosis (jailed by stent)   Diabetes mellitus, insulin dependent   OSA   Hypertension   Hyperlipidemia   Morbid obesity   Prior CV studies: Echocardiogram 10/26/2019 EF 60-65, no RWMA, mild conc LVH, Gr 1 DD, normal RVSF, mild LAE  Cardiac catheterization 10/26/2019 LAD prox stent patent w/ 10 ISR, mid 100 (thrombotic); D1 stent 80 ISR, then 70 LCx prox 30 PCI:  2.5 x 28 mm Synergy DES to mid LAD  History of Present Illness:    Brenda [name] was last seen in clinic by Dr. Burt Knack in 10/2013.  She lost health insurance and saw Dr. Verl Blalock once in 2016 at the [facility].    She was recently admitted 2/22-2/24 with an acute anterior STEMI tx with DES to the LAD (overlapping with the old stent).  She was seen by case management to help with medications (self pay).  She was enrolled in the [facility] program.  Of note, ACE inhibitor was not started due to K+ 5.1.    She returns for follow up.  She is here alone.  She has been doing well since discharge.  She has chronic dyspnea with exertion that is unchanged.  Actually she feels her breathing is improved.  She has not had chest discomfort, syncope, orthopnea, lower extremity swelling.  She has not really been established with the match program.  She is about to run out of [place].   She did check into Effient which she can get for $15 with GoodRx.  It is not clear if this is for one prescription or for a years worth.  Past Medical History:  Diagnosis Date  . Arthritis    "back, hands, hips" (02/22/2015)  . CAD (coronary artery disease)    a. complex LAD/diagonal bifurcation PCI in 2010. b. STEMI 10/2019 s/p PTCA/DES x1 to mLAD overlapping the old stent, residual disease treated medicaly.  . Depression   . Diabetes mellitus ([place])    "started when I was pregnant; not sure if it was type 1 or type 2"   . Hypercholesterolemia   . Hypertension   . Morbid obesity ([place])   . Sleep apnea    "suppose to wear mask; I don't" (02/22/2015)    Current Medications: Current Meds  Medication Sig  . aspirin EC 81 MG tablet Take 1 tablet (81 mg total) by mouth daily.  Marland Kitchen atorvastatin (LIPITOR) 80 MG tablet Take 1 tablet (80 mg total) by mouth every evening.  . Blood Glucose Monitoring Suppl (TRUE METRIX METER) DEVI 1 each by Does not apply route 3 (three) times daily.  Marland Kitchen glucose blood (TRUE METRIX BLOOD GLUCOSE TEST) test strip Use as instructed  . insulin isophane & regular human (NOVOLIN 70/30 FLEXPEN) (70-30) 100 UNIT/ML  KwikPen Inject subcutaneously twice daily 55 units in the morning and 50 units in the evening  . Insulin Pen Needle (NOVOFINE) 30G X 8 MM MISC Use as directed with Novolin 70/30 insulin  . metoprolol tartrate (LOPRESSOR) 25 MG tablet Take 1 tablet (25 mg total) by mouth 2 (two) times daily.  . nitroGLYCERIN (NITROSTAT) 0.4 MG SL tablet Place 1 tablet (0.4 mg total) under the tongue every 5 (five) minutes as needed for chest pain (up to 3 doses. If taking 3rd dose call 911).  . ticagrelor (BRILINTA) 90 MG TABS tablet Take 1 tablet (90 mg total) by mouth 2 (two) times daily.  . TRUEPLUS LANCETS 30G MISC 1 each by Does not apply route 3 (three) times daily.     Allergies:   Hydrocodone, Clopidogrel bisulfate, and Hydrocodeine [dihydrocodeine]   Social History    Tobacco Use  . Smoking status: Former Smoker    Packs/day: 0.50    Years: 1.00    Pack years: 0.50    Types: Cigarettes, Cigars    Quit date: 09/03/1986    Years since quitting: 33.2  . Smokeless tobacco: Never Used  Substance Use Topics  . Alcohol use: No  . Drug use: No     Family Hx: The patient's family history includes COPD in her father; Cancer in her mother; Coronary artery disease in her mother and sister; Diabetes in her sister; Heart attack in her father; Hypertension in her mother; Stroke in her sister.  ROS   EKGs/Labs/Other Test Reviewed:    EKG:  EKG is   ordered today.  The ekg ordered today demonstrates normal sinus rhythm, heart rate 71, right bundle branch block, left axis deviation, interventricular conduction delay, QTC 44, similar to old EKGs  Recent Labs: 10/27/2019: BUN 14; Creatinine, Ser 0.81; Hemoglobin 15.4; Platelets 302; Potassium 5.1; Sodium 137 10/28/2019: ALT 18   Recent Lipid Panel Lab Results  Component Value Date/Time   CHOL 174 10/27/2019 02:19 AM   TRIG 104 10/27/2019 02:19 AM   HDL 50 10/27/2019 02:19 AM   CHOLHDL 3.5 10/27/2019 02:19 AM   LDLCALC 103 (H) 10/27/2019 02:19 AM   LDLDIRECT 135.9 02/22/2011 11:32 AM    Physical Exam:    VS:  BP 110/70   Pulse 79   Ht 5\' 2"  (1.575 m)   Wt 296 lb 12.8 oz (134.6 kg)   SpO2 98%   BMI 54.29 kg/m     Wt Readings from Last 3 Encounters:  12/01/19 296 lb 12.8 oz (134.6 kg)  10/28/19 (!) 310 lb 3.2 oz (140.7 kg)  08/16/16 270 lb 6.4 oz (122.7 kg)     Constitutional:      Appearance: Healthy appearance. Not in distress.  Eyes:     Conjunctiva/sclera: Conjunctivae normal.  Neck:     Vascular: JVD normal.  Pulmonary:     Breath sounds: No wheezing. No rales.  Cardiovascular:     Normal rate. Regular rhythm. Normal S1. Normal S2.     Murmurs: There is no murmur.  Edema:    Peripheral edema absent.  Abdominal:     Palpations: Abdomen is soft. There is no hepatomegaly.   Musculoskeletal:     Comments: Left wrist without hematoma Skin:    General: Skin is warm and dry.  Neurological:     General: No focal deficit present.     Mental Status: Alert and oriented to person, place and time.       ASSESSMENT & PLAN:  1. Coronary artery disease involving native coronary artery of native heart without angina pectoris She is status post complex PCI to the LAD/diagonal in 2000 in the setting of a non-STEMI.  She recently was admitted in Feb 2021 with an anterior STEMI treat with a drug-eluting stent to the LAD overlapping with the previous stent.  She has residual disease in a jailed diagonal which is managed medically.  Since discharge, she has been doing well without anginal symptoms.  She is able to get all of her medications except for Ticagrelor.  She is still waiting on Medicaid.  I will make sure that we fill out assistance paperwork for her and get her some samples so that she does not run out.  If she does not qualify for assistance, we can certainly switch her to prasugrel.  She notes that she can get this for $15.  As noted, I am not sure if this is for 30 days or for an entire year.  Therefore, I would not want to switch her unless she cannot get assistance for Ticagrelor and we can confirm she can get long term Prasugrel.  Continue aspirin, Ticagrelor, metoprolol tartrate, atorvastatin.  Obtain CMET in 2 weeks.  I have asked her to collect blood pressures over that timeframe.  If her creatinine, potassium are normal and her blood pressures are able to tolerate the addition of an ACE inhibitor, I will place her on low-dose lisinopril.  She can see me back in 6 to 8 weeks.  I have encouraged her to pursue cardiac rehabilitation.  2. Essential hypertension The patient's blood pressure is controlled on her current regimen.  Continue current therapy.   3. Type 2 diabetes mellitus with complication, with long-term current use of insulin ([place]) She is followed by  primary care at [place] and wellness clinic.  4. Hyperlipidemia associated with type 2 diabetes mellitus ([place]) Continue high-dose statin therapy.  Arrange fasting lipids, CMET in 2 weeks.   Dispo:  Return in about 8 weeks (around 01/26/2020) for Routine Follow Up, w/ Dr. Burt Knack, or Richardson Dopp, PA-C, in person.   Medication Adjustments/Labs and Tests Ordered: Current medicines are reviewed at length with the patient today.  Concerns regarding medicines are outlined above.  Tests Ordered: Orders Placed This Encounter  Procedures  . Comprehensive metabolic panel  . Lipid panel  . EKG 12-Lead   Medication Changes: No orders of the defined types were placed in this encounter.   Signed, Richardson Dopp, PA-C  12/01/2019 10:21 AM    [facility] [place], [place], [place]  [address] Phone: [phone]; Fax: [phone]

## 2019-12-01 ENCOUNTER — Ambulatory Visit (INDEPENDENT_AMBULATORY_CARE_PROVIDER_SITE_OTHER): Payer: Self-pay | Admitting: Physician Assistant

## 2019-12-01 ENCOUNTER — Encounter: Payer: Self-pay | Admitting: Physician Assistant

## 2019-12-01 ENCOUNTER — Other Ambulatory Visit: Payer: Self-pay

## 2019-12-01 VITALS — BP 110/70 | HR 79 | Ht 62.0 in | Wt 296.8 lb

## 2019-12-01 DIAGNOSIS — E1169 Type 2 diabetes mellitus with other specified complication: Secondary | ICD-10-CM

## 2019-12-01 DIAGNOSIS — Z794 Long term (current) use of insulin: Secondary | ICD-10-CM

## 2019-12-01 DIAGNOSIS — I10 Essential (primary) hypertension: Secondary | ICD-10-CM

## 2019-12-01 DIAGNOSIS — I2510 Atherosclerotic heart disease of native coronary artery without angina pectoris: Secondary | ICD-10-CM

## 2019-12-01 DIAGNOSIS — E785 Hyperlipidemia, unspecified: Secondary | ICD-10-CM

## 2019-12-01 DIAGNOSIS — E118 Type 2 diabetes mellitus with unspecified complications: Secondary | ICD-10-CM

## 2019-12-01 NOTE — Patient Instructions (Addendum)
Medication Instructions:   Your physician recommends that you continue on your current medications as directed. Please refer to the Current Medication list given to you today.  *If you need a refill on your cardiac medications before your next appointment, please call your pharmacy*  Lab Work:  Your physician recommends that you return for lab work 2 weeks on 12/15/19  If you have labs (blood work) drawn today and your tests are completely normal, you will receive your results only by: Marland Kitchen MyChart Message (if you have MyChart) OR . A paper copy in the mail If you have any lab test that is abnormal or we need to change your treatment, we will call you to review the results.  Testing/Procedures:  None ordered today  Follow-Up: At [facility], you and your health needs are our priority.  As part of our continuing mission to provide you with exceptional heart care, we have created designated Provider Care Teams.  These Care Teams include your primary Cardiologist (physician) and Advanced Practice Providers (APPs -  Physician Assistants and Nurse Practitioners) who all work together to provide you with the care you need, when you need it.  We recommend signing up for the patient portal called "MyChart".  Sign up information is provided on this After Visit Summary.  MyChart is used to connect with patients for Virtual Visits (Telemedicine).  Patients are able to view lab/test results, encounter notes, upcoming appointments, etc.  Non-urgent messages can be sent to your provider as well.   To learn more about what you can do with MyChart, go to [email or web address].    Your next appointment:    On 01/12/20 at 10:15AM with Richardson Dopp, PA-C  Other Instructions  Check blood pressure everyday and bring to lab visit in 2 weeks

## 2019-12-02 ENCOUNTER — Telehealth: Payer: Self-pay

## 2019-12-02 NOTE — Telephone Encounter (Signed)
**Note De-Identified Brenda [name] Obfuscation** The pt states that she was given samples of Brilinta and a phone number to call [place] [facility] [place] yesterday while at an OV with Richardson Dopp, PA-c.  She states that she has not called [place] [facility] [place] yet. I have strongly advised her to call them ASAP as the office cannot always provide samples of Brilinta as we run out often.  She states that she will call them today and is aware to complete her application ASAP once she receives it from [facility] [place] and to bring it to the office to drop off so we can handle the provider part and fax all to [place] [facility] [place].  She verbalized understanding.

## 2019-12-02 NOTE — Telephone Encounter (Signed)
**Note De-identified Abigail Teall Obfuscation** -----  **Note De-Identified Jesusmanuel Erbes Obfuscation** Message from Liliane Shi, [place] sent at 12/01/2019  4:48 PM EDT ----- Regarding: Linna Hoff Can you help with assistance for Brilinta? We gave her paperwork to fill out today and some samples.  She will run out soon. Thank you! Scott

## 2019-12-04 ENCOUNTER — Other Ambulatory Visit: Payer: Self-pay

## 2019-12-04 ENCOUNTER — Telehealth (HOSPITAL_COMMUNITY): Payer: Self-pay

## 2019-12-04 MED ORDER — METOPROLOL TARTRATE 25 MG PO TABS: 25.0000 mg | ORAL_TABLET | Freq: Two times a day (BID) | ORAL | 3 refills | Status: DC

## 2019-12-04 MED ORDER — ATORVASTATIN CALCIUM 80 MG PO TABS: 80.0000 mg | ORAL_TABLET | Freq: Every evening | ORAL | 3 refills | Status: DC

## 2019-12-04 NOTE — Telephone Encounter (Signed)
[name] medications were sent to pt's pharmacy as requested. Confirmation received.  

## 2019-12-04 NOTE — Telephone Encounter (Signed)
Called patient to see if she was interested in participating in the Cardiac Rehab Program. Patient stated yes she was interested in the cardiac rehab virtual only. Patient will come in for orientation on 12/15/2019@8 :30am for walk test only.  Mailed homework package.

## 2019-12-07 ENCOUNTER — Telehealth (HOSPITAL_COMMUNITY): Payer: Self-pay

## 2019-12-07 NOTE — Telephone Encounter (Signed)
Cardiac Rehab Medication Review by a Pharmacist  Does the patient  feel that his/her medications are working for him/her?  yes  Has the patient been experiencing any side effects to the medications prescribed?  yes Pt has some shortness of breath sensation with Brilinta, which is to be expected with this medication. I explained to her that this is typically transient, but if it becomes unbearable we could try another agent in this class like Effient (she's allergic to Plavix).  Does the patient measure his/her own blood pressure or blood glucose at home?  yes [name] cardiologist has asked her to track BP for 2 weeks, typically running at 120/80. Pt checks BG multiple times a day, usually 80-100 in the morning, she still has BG in the 200s before dinner. She occasionally has lows but she is aware of symptoms.   Does the patient have any problems obtaining medications due to transportation or finances?   yes Please see notes in her chart regarding cost issues related to Brilinta. Pt is currently working on paperwork for patient assistance.   Understanding of regimen: excellent Understanding of indications: excellent Potential of compliance: excellent    Pharmacist comments: Pt may be starting an ACEi/ARB soon per cardiologist's note. Please update her med list as needed on day of orientation.     Berenice Bouton, PharmD PGY1 Pharmacy Resident  Please check AMION for all [place] phone numbers After 10:00 PM, call [place] [phone]  12/07/2019 3:26 PM

## 2019-12-10 MED ORDER — PRASUGREL HCL 10 MG PO TABS: 10.0000 mg | ORAL_TABLET | Freq: Every day | ORAL | 4 refills | Status: DC

## 2019-12-10 MED ORDER — PRASUGREL HCL 10 MG PO TABS: 60.0000 mg | ORAL_TABLET | Freq: Once | ORAL | 0 refills | Status: AC

## 2019-12-10 NOTE — Telephone Encounter (Signed)
Any word on her getting Brilinta? Thanks, Richardson Dopp, PA-C    12/10/2019 10:42 AM

## 2019-12-10 NOTE — Telephone Encounter (Signed)
She took Prasugrel (Effient) in the past without issues. Please make sure she can get Prasugrel (Effient) - please provide samples, Rx card if we can.  Also, start assistance paperwork. PLAN:  1. DC Ticagrelor (Brilinta) 2. Start Prasugrel (Effient) - on day 1, take 60 mg twelve hours after last dose of Brilinta. 3. Then, take Effient 10 mg once daily  4. I have sent Rx to her pharmacy ([facility]). Thanks Purvis Kilts, PA-C    12/10/2019 4:35 PM

## 2019-12-10 NOTE — Telephone Encounter (Addendum)
**Note De-Identified Brenda [name] Obfuscation** The pt reports that she is experiencing a lot of SOB with and without exertion since she started taking Brilinta and that her SOB is worse with the PM dose. She is unsure if she can continue to tolerate. She states that she has 7 days worth of Brilinta on hand.  She states she had to stop taking Plavix for the same reason.  She is aware that I am sending this note To Richardson Dopp for his recommendations.  The pt states that she does have her [place] Pt Asst application ready but that she is "fighting the IRS for documents showing that her husband pays her [place] monthly and that is her only income. She states that she does have bank statements that shows he deposits the same amount at the same time every month. I advised her that that should be all we need and I asked her to write a note on the bank statement that this is her only income and to sign and date the note.

## 2019-12-10 NOTE — Addendum Note (Signed)
Addended byKathlen Mody, Nicki Reaper T on: 12/10/2019 04:40 PM   Modules accepted: Orders

## 2019-12-11 NOTE — Telephone Encounter (Signed)
I called and spoke with patient, she is aware that 12 hours after last Brilinta dose to take 60 mg (6 tablets) of Effient then, go to 1 tablet by mouth once a day after initial dose. Patient will check on price of Effient at [facility] where prescription was sent. If she needs RX sent to another pharmacy where medication is cheaper she will call back.

## 2019-12-14 ENCOUNTER — Telehealth (HOSPITAL_COMMUNITY): Payer: Self-pay | Admitting: Family Medicine

## 2019-12-15 ENCOUNTER — Ambulatory Visit (HOSPITAL_COMMUNITY): Payer: Medicaid Other

## 2019-12-15 ENCOUNTER — Other Ambulatory Visit: Payer: Self-pay

## 2019-12-16 ENCOUNTER — Other Ambulatory Visit: Payer: Self-pay | Admitting: *Deleted

## 2019-12-16 ENCOUNTER — Encounter (HOSPITAL_COMMUNITY): Payer: Self-pay

## 2019-12-16 ENCOUNTER — Other Ambulatory Visit: Payer: Self-pay

## 2019-12-16 DIAGNOSIS — E118 Type 2 diabetes mellitus with unspecified complications: Secondary | ICD-10-CM

## 2019-12-16 DIAGNOSIS — I10 Essential (primary) hypertension: Secondary | ICD-10-CM

## 2019-12-16 DIAGNOSIS — I2510 Atherosclerotic heart disease of native coronary artery without angina pectoris: Secondary | ICD-10-CM

## 2019-12-16 DIAGNOSIS — E785 Hyperlipidemia, unspecified: Secondary | ICD-10-CM

## 2019-12-16 DIAGNOSIS — E1169 Type 2 diabetes mellitus with other specified complication: Secondary | ICD-10-CM

## 2019-12-16 LAB — COMPREHENSIVE METABOLIC PANEL
ALT: 11 IU/L (ref 0–32)
AST: 15 IU/L (ref 0–40)
Albumin/Globulin Ratio: 1.2 (ref 1.2–2.2)
Albumin: 3.8 g/dL (ref 3.8–4.9)
Alkaline Phosphatase: 125 IU/L — ABNORMAL HIGH (ref 39–117)
BUN/Creatinine Ratio: 11 (ref 9–23)
BUN: 9 mg/dL (ref 6–24)
Bilirubin Total: 0.3 mg/dL (ref 0.0–1.2)
CO2: 24 mmol/L (ref 20–29)
Calcium: 9.2 mg/dL (ref 8.7–10.2)
Chloride: 99 mmol/L (ref 96–106)
Creatinine, Ser: 0.81 mg/dL (ref 0.57–1.00)
GFR calc Af Amer: 94 mL/min/{1.73_m2} (ref >59)
GFR calc non Af Amer: 81 mL/min/{1.73_m2} (ref >59)
Globulin, Total: 3.2 g/dL (ref 1.5–4.5)
Glucose: 322 mg/dL — ABNORMAL HIGH (ref 65–99)
Potassium: 4.9 mmol/L (ref 3.5–5.2)
Sodium: 138 mmol/L (ref 134–144)
Total Protein: 7 g/dL (ref 6.0–8.5)

## 2019-12-16 LAB — LIPID PANEL
Chol/HDL Ratio: 2.2 ratio (ref 0.0–4.4)
Cholesterol, Total: 111 mg/dL (ref 100–199)
HDL: 50 mg/dL (ref >39)
LDL Chol Calc (NIH): 45 mg/dL (ref 0–99)
Triglycerides: 80 mg/dL (ref 0–149)
VLDL Cholesterol Cal: 16 mg/dL (ref 5–40)

## 2019-12-16 NOTE — Telephone Encounter (Signed)
Attempted to contact pt to reschedule CR orientation.  LMTCB

## 2019-12-17 ENCOUNTER — Telehealth: Payer: Self-pay

## 2019-12-17 NOTE — Telephone Encounter (Signed)
**Note De-Identified Jaice Digioia Obfuscation** The pt left her financial documents at the office in case we need it when/if she applies for asst for her Effient through [facility].  I did provide her with their phone number ([facility] [phone]) to call and see if they can offer her asst.  She states that she was able to purchase her Effient from [facility] for $27/30 day supply but knows that that is not a guaranteed set price so she is going to contact [facility] to discuss options.

## 2019-12-25 ENCOUNTER — Telehealth: Payer: Self-pay

## 2019-12-25 NOTE — Telephone Encounter (Signed)
Patient was called and a voicemail was left informing patient to return phone call for lab results. 

## 2019-12-25 NOTE — Telephone Encounter (Signed)
-----   Message from Charlott Rakes, MD sent at 12/25/2019 12:59 PM EDT ----- Please advise her to increase insulin to 60 units twice daily as labs ordered by cardiology revealed elevated glucose

## 2019-12-28 NOTE — Telephone Encounter (Signed)
Patient name and DOB has been verified Patient was informed of lab results. Patient had no questions.  

## 2019-12-31 ENCOUNTER — Other Ambulatory Visit: Payer: Self-pay | Admitting: Nurse Practitioner

## 2020-01-01 ENCOUNTER — Telehealth: Payer: Self-pay

## 2020-01-01 ENCOUNTER — Telehealth (HOSPITAL_COMMUNITY): Payer: Self-pay

## 2020-01-01 MED ORDER — LISINOPRIL 2.5 MG PO TABS: 2.5000 mg | ORAL_TABLET | Freq: Every day | ORAL | 3 refills | Status: DC

## 2020-01-01 NOTE — Telephone Encounter (Signed)
-----   Message from Liliane Shi, [place] sent at 12/16/2019  5:16 PM EDT ----- Glucose is too high.  Creatinine, potassium, ALT normal.  Alkaline phosphatase is elevated.  Lipids optimal. PLAN:   - Follow-up with primary care for diabetes and elevated alkaline phosphatase.  Send copy to PCP  - Continue current medications  - Start lisinopril 2.5 mg daily  - BMET 2 weeks Richardson Dopp, PA-C    12/16/2019 5:13 PM

## 2020-01-01 NOTE — Telephone Encounter (Signed)
No response from pt regarding CR.  Closed referral.  

## 2020-01-01 NOTE — Telephone Encounter (Signed)
The patient has been notified of the result and verbalized understanding.  All questions (if any) were answered. Lisinopril sent to pharmacy. Repeat BMET to be done on 01/12/20 at [facility] with Lilly. Mady Haagensen, [place] 01/01/2020 3:20 PM

## 2020-01-12 ENCOUNTER — Ambulatory Visit: Payer: Self-pay | Admitting: Physician Assistant

## 2020-12-13 ENCOUNTER — Other Ambulatory Visit: Payer: Self-pay | Admitting: Physician Assistant

## 2021-02-08 ENCOUNTER — Inpatient Hospital Stay (HOSPITAL_COMMUNITY)
Admission: EM | Admit: 2021-02-08 | Discharge: 2021-02-14 | DRG: 291 | Disposition: A | Discharge status: Home / Self Care | Payer: Medicaid Other | Attending: Internal Medicine | Admitting: Internal Medicine

## 2021-02-08 ENCOUNTER — Emergency Department (HOSPITAL_COMMUNITY): Payer: Medicaid Other

## 2021-02-08 ENCOUNTER — Encounter (HOSPITAL_COMMUNITY): Payer: Self-pay | Admitting: Emergency Medicine

## 2021-02-08 ENCOUNTER — Inpatient Hospital Stay (HOSPITAL_COMMUNITY): Payer: Medicaid Other

## 2021-02-08 ENCOUNTER — Other Ambulatory Visit: Payer: Self-pay

## 2021-02-08 DIAGNOSIS — Z79899 Other long term (current) drug therapy: Secondary | ICD-10-CM

## 2021-02-08 DIAGNOSIS — E874 Mixed disorder of acid-base balance: Secondary | ICD-10-CM | POA: Diagnosis present

## 2021-02-08 DIAGNOSIS — Z6841 Body Mass Index (BMI) 40.0 and over, adult: Secondary | ICD-10-CM

## 2021-02-08 DIAGNOSIS — I252 Old myocardial infarction: Secondary | ICD-10-CM | POA: Diagnosis not present

## 2021-02-08 DIAGNOSIS — I959 Hypotension, unspecified: Secondary | ICD-10-CM | POA: Diagnosis present

## 2021-02-08 DIAGNOSIS — T39016A Underdosing of aspirin, initial encounter: Secondary | ICD-10-CM | POA: Diagnosis present

## 2021-02-08 DIAGNOSIS — Z888 Allergy status to other drugs, medicaments and biological substances status: Secondary | ICD-10-CM

## 2021-02-08 DIAGNOSIS — M19042 Primary osteoarthritis, left hand: Secondary | ICD-10-CM | POA: Diagnosis present

## 2021-02-08 DIAGNOSIS — Z825 Family history of asthma and other chronic lower respiratory diseases: Secondary | ICD-10-CM | POA: Diagnosis not present

## 2021-02-08 DIAGNOSIS — Z91120 Patient's intentional underdosing of medication regimen due to financial hardship: Secondary | ICD-10-CM

## 2021-02-08 DIAGNOSIS — E662 Morbid (severe) obesity with alveolar hypoventilation: Secondary | ICD-10-CM | POA: Diagnosis present

## 2021-02-08 DIAGNOSIS — M160 Bilateral primary osteoarthritis of hip: Secondary | ICD-10-CM | POA: Diagnosis present

## 2021-02-08 DIAGNOSIS — J9601 Acute respiratory failure with hypoxia: Secondary | ICD-10-CM | POA: Diagnosis not present

## 2021-02-08 DIAGNOSIS — R001 Bradycardia, unspecified: Secondary | ICD-10-CM | POA: Diagnosis not present

## 2021-02-08 DIAGNOSIS — M19041 Primary osteoarthritis, right hand: Secondary | ICD-10-CM | POA: Diagnosis present

## 2021-02-08 DIAGNOSIS — Z823 Family history of stroke: Secondary | ICD-10-CM

## 2021-02-08 DIAGNOSIS — E8809 Other disorders of plasma-protein metabolism, not elsewhere classified: Secondary | ICD-10-CM | POA: Diagnosis present

## 2021-02-08 DIAGNOSIS — D509 Iron deficiency anemia, unspecified: Secondary | ICD-10-CM | POA: Diagnosis present

## 2021-02-08 DIAGNOSIS — Z9049 Acquired absence of other specified parts of digestive tract: Secondary | ICD-10-CM

## 2021-02-08 DIAGNOSIS — T45526A Underdosing of antithrombotic drugs, initial encounter: Secondary | ICD-10-CM | POA: Diagnosis present

## 2021-02-08 DIAGNOSIS — I4510 Unspecified right bundle-branch block: Secondary | ICD-10-CM | POA: Diagnosis not present

## 2021-02-08 DIAGNOSIS — E11649 Type 2 diabetes mellitus with hypoglycemia without coma: Secondary | ICD-10-CM | POA: Diagnosis present

## 2021-02-08 DIAGNOSIS — Z8249 Family history of ischemic heart disease and other diseases of the circulatory system: Secondary | ICD-10-CM | POA: Diagnosis not present

## 2021-02-08 DIAGNOSIS — I5022 Chronic systolic (congestive) heart failure: Secondary | ICD-10-CM

## 2021-02-08 DIAGNOSIS — E7800 Pure hypercholesterolemia, unspecified: Secondary | ICD-10-CM | POA: Diagnosis present

## 2021-02-08 DIAGNOSIS — I50811 Acute right heart failure: Secondary | ICD-10-CM | POA: Diagnosis present

## 2021-02-08 DIAGNOSIS — T383X5A Adverse effect of insulin and oral hypoglycemic [antidiabetic] drugs, initial encounter: Secondary | ICD-10-CM | POA: Diagnosis present

## 2021-02-08 DIAGNOSIS — I5033 Acute on chronic diastolic (congestive) heart failure: Secondary | ICD-10-CM | POA: Diagnosis present

## 2021-02-08 DIAGNOSIS — I2510 Atherosclerotic heart disease of native coronary artery without angina pectoris: Secondary | ICD-10-CM | POA: Diagnosis present

## 2021-02-08 DIAGNOSIS — Z20822 Contact with and (suspected) exposure to covid-19: Secondary | ICD-10-CM | POA: Diagnosis present

## 2021-02-08 DIAGNOSIS — Z955 Presence of coronary angioplasty implant and graft: Secondary | ICD-10-CM | POA: Diagnosis not present

## 2021-02-08 DIAGNOSIS — Z833 Family history of diabetes mellitus: Secondary | ICD-10-CM | POA: Diagnosis not present

## 2021-02-08 DIAGNOSIS — Z794 Long term (current) use of insulin: Secondary | ICD-10-CM

## 2021-02-08 DIAGNOSIS — Z9119 Patient's noncompliance with other medical treatment and regimen: Secondary | ICD-10-CM

## 2021-02-08 DIAGNOSIS — I5032 Chronic diastolic (congestive) heart failure: Secondary | ICD-10-CM | POA: Diagnosis present

## 2021-02-08 DIAGNOSIS — Z7902 Long term (current) use of antithrombotics/antiplatelets: Secondary | ICD-10-CM

## 2021-02-08 DIAGNOSIS — E282 Polycystic ovarian syndrome: Secondary | ICD-10-CM | POA: Diagnosis present

## 2021-02-08 DIAGNOSIS — F32A Depression, unspecified: Secondary | ICD-10-CM | POA: Diagnosis present

## 2021-02-08 DIAGNOSIS — Z7982 Long term (current) use of aspirin: Secondary | ICD-10-CM

## 2021-02-08 DIAGNOSIS — T502X5A Adverse effect of carbonic-anhydrase inhibitors, benzothiadiazides and other diuretics, initial encounter: Secondary | ICD-10-CM | POA: Diagnosis present

## 2021-02-08 DIAGNOSIS — I110 Hypertensive heart disease with heart failure: Secondary | ICD-10-CM | POA: Diagnosis present

## 2021-02-08 DIAGNOSIS — R0600 Dyspnea, unspecified: Secondary | ICD-10-CM

## 2021-02-08 DIAGNOSIS — Z597 Insufficient social insurance and welfare support: Secondary | ICD-10-CM

## 2021-02-08 DIAGNOSIS — E162 Hypoglycemia, unspecified: Secondary | ICD-10-CM

## 2021-02-08 DIAGNOSIS — I5030 Unspecified diastolic (congestive) heart failure: Secondary | ICD-10-CM

## 2021-02-08 DIAGNOSIS — Z87891 Personal history of nicotine dependence: Secondary | ICD-10-CM

## 2021-02-08 DIAGNOSIS — Z885 Allergy status to narcotic agent status: Secondary | ICD-10-CM

## 2021-02-08 DIAGNOSIS — J9602 Acute respiratory failure with hypercapnia: Secondary | ICD-10-CM | POA: Diagnosis not present

## 2021-02-08 DIAGNOSIS — G4733 Obstructive sleep apnea (adult) (pediatric): Secondary | ICD-10-CM

## 2021-02-08 DIAGNOSIS — E119 Type 2 diabetes mellitus without complications: Secondary | ICD-10-CM

## 2021-02-08 DIAGNOSIS — I509 Heart failure, unspecified: Secondary | ICD-10-CM | POA: Diagnosis present

## 2021-02-08 LAB — CBG MONITORING, ED
Glucose-Capillary: 28 mg/dL — CL (ref 70–99)
Glucose-Capillary: 59 mg/dL — ABNORMAL LOW (ref 70–99)
Glucose-Capillary: 48 mg/dL — ABNORMAL LOW (ref 70–99)
Glucose-Capillary: 47 mg/dL — ABNORMAL LOW (ref 70–99)
Glucose-Capillary: 105 mg/dL — ABNORMAL HIGH (ref 70–99)
Glucose-Capillary: 44 mg/dL — CL (ref 70–99)
Glucose-Capillary: 90 mg/dL (ref 70–99)
Glucose-Capillary: 101 mg/dL — ABNORMAL HIGH (ref 70–99)
Glucose-Capillary: 147 mg/dL — ABNORMAL HIGH (ref 70–99)

## 2021-02-08 LAB — CBC WITH DIFFERENTIAL/PLATELET
Abs Immature Granulocytes: 0 10*3/uL (ref 0.00–0.07)
Basophils Absolute: 0.1 10*3/uL (ref 0.0–0.1)
Basophils Relative: 1 %
Eosinophils Absolute: 0.2 10*3/uL (ref 0.0–0.5)
Eosinophils Relative: 2 %
HCT: 34.9 % — ABNORMAL LOW (ref 36.0–46.0)
Hemoglobin: 9.2 g/dL — ABNORMAL LOW (ref 12.0–15.0)
Lymphocytes Relative: 9 %
Lymphs Abs: 1.1 10*3/uL (ref 0.7–4.0)
MCH: 19.7 pg — ABNORMAL LOW (ref 26.0–34.0)
MCHC: 26.4 g/dL — ABNORMAL LOW (ref 30.0–36.0)
MCV: 74.9 fL — ABNORMAL LOW (ref 80.0–100.0)
Monocytes Absolute: 0.5 10*3/uL (ref 0.1–1.0)
Monocytes Relative: 4 %
Neutro Abs: 9.8 10*3/uL — ABNORMAL HIGH (ref 1.7–7.7)
Neutrophils Relative %: 84 %
Platelets: 380 10*3/uL (ref 150–400)
RBC: 4.66 MIL/uL (ref 3.87–5.11)
RDW: 21.2 % — ABNORMAL HIGH (ref 11.5–15.5)
WBC: 11.7 10*3/uL — ABNORMAL HIGH (ref 4.0–10.5)
nRBC: 0.4 % — ABNORMAL HIGH (ref 0.0–0.2)
nRBC: 1 /100 WBC — ABNORMAL HIGH

## 2021-02-08 LAB — BRAIN NATRIURETIC PEPTIDE: B Natriuretic Peptide: 302.6 pg/mL — ABNORMAL HIGH (ref 0.0–100.0)

## 2021-02-08 LAB — ECHOCARDIOGRAM COMPLETE
Area-P 1/2: 2.44 cm2
S' Lateral: 3.5 cm

## 2021-02-08 LAB — URINALYSIS, ROUTINE W REFLEX MICROSCOPIC
Bilirubin Urine: NEGATIVE
Glucose, UA: NEGATIVE mg/dL
Ketones, ur: NEGATIVE mg/dL
Nitrite: NEGATIVE
Protein, ur: NEGATIVE mg/dL
Specific Gravity, Urine: 1.006 (ref 1.005–1.030)
pH: 5 (ref 5.0–8.0)

## 2021-02-08 LAB — COMPREHENSIVE METABOLIC PANEL
ALT: 18 U/L (ref 0–44)
AST: 23 U/L (ref 15–41)
Albumin: 3 g/dL — ABNORMAL LOW (ref 3.5–5.0)
Alkaline Phosphatase: 114 U/L (ref 38–126)
Anion gap: 9 (ref 5–15)
BUN: 8 mg/dL (ref 6–20)
CO2: 27 mmol/L (ref 22–32)
Calcium: 8.6 mg/dL — ABNORMAL LOW (ref 8.9–10.3)
Chloride: 101 mmol/L (ref 98–111)
Creatinine, Ser: 0.81 mg/dL (ref 0.44–1.00)
GFR, Estimated: >60 mL/min (ref >60)
Glucose, Bld: 33 mg/dL — CL (ref 70–99)
Potassium: 3.9 mmol/L (ref 3.5–5.1)
Sodium: 137 mmol/L (ref 135–145)
Total Bilirubin: 0.5 mg/dL (ref 0.3–1.2)
Total Protein: 7 g/dL (ref 6.5–8.1)

## 2021-02-08 LAB — CBC
HCT: 38 % (ref 36.0–46.0)
Hemoglobin: 9.7 g/dL — ABNORMAL LOW (ref 12.0–15.0)
MCH: 19.6 pg — ABNORMAL LOW (ref 26.0–34.0)
MCHC: 25.5 g/dL — ABNORMAL LOW (ref 30.0–36.0)
MCV: 76.8 fL — ABNORMAL LOW (ref 80.0–100.0)
Platelets: 446 10*3/uL — ABNORMAL HIGH (ref 150–400)
RBC: 4.95 MIL/uL (ref 3.87–5.11)
RDW: 21.2 % — ABNORMAL HIGH (ref 11.5–15.5)
WBC: 12.6 10*3/uL — ABNORMAL HIGH (ref 4.0–10.5)
nRBC: 0.3 % — ABNORMAL HIGH (ref 0.0–0.2)

## 2021-02-08 LAB — GLUCOSE, CAPILLARY: Glucose-Capillary: 129 mg/dL — ABNORMAL HIGH (ref 70–99)

## 2021-02-08 LAB — POC OCCULT BLOOD, ED: Fecal Occult Bld: NEGATIVE

## 2021-02-08 LAB — RESP PANEL BY RT-PCR (FLU A&B, COVID) ARPGX2
Influenza A by PCR: NEGATIVE
Influenza B by PCR: NEGATIVE
SARS Coronavirus 2 by RT PCR: NEGATIVE

## 2021-02-08 LAB — IRON AND TIBC
Iron: 26 ug/dL — ABNORMAL LOW (ref 28–170)
Saturation Ratios: 5 % — ABNORMAL LOW (ref 10.4–31.8)
TIBC: 490 ug/dL — ABNORMAL HIGH (ref 250–450)
UIBC: 464 ug/dL

## 2021-02-08 LAB — FERRITIN: Ferritin: 6 ng/mL — ABNORMAL LOW (ref 11–307)

## 2021-02-08 LAB — D-DIMER, QUANTITATIVE: D-Dimer, Quant: 1.52 ug/mL-FEU — ABNORMAL HIGH (ref 0.00–0.50)

## 2021-02-08 LAB — TROPONIN I (HIGH SENSITIVITY)
Troponin I (High Sensitivity): 9 ng/L (ref <18)
Troponin I (High Sensitivity): 9 ng/L (ref <18)

## 2021-02-08 LAB — HIV ANTIBODY (ROUTINE TESTING W REFLEX): HIV Screen 4th Generation wRfx: NONREACTIVE

## 2021-02-08 MED ORDER — PERFLUTREN LIPID MICROSPHERE
1.0000 mL | INTRAVENOUS | Status: AC | PRN
  Administered 2021-02-08: 4 mL via INTRAVENOUS
  Filled 2021-02-08: qty 10

## 2021-02-08 MED ORDER — FUROSEMIDE 10 MG/ML IJ SOLN
40.0000 mg | Freq: Once | INTRAMUSCULAR | Status: AC
  Administered 2021-02-08: 40 mg via INTRAVENOUS
  Filled 2021-02-08: qty 4

## 2021-02-08 MED ORDER — ATORVASTATIN CALCIUM 80 MG PO TABS
80.0000 mg | ORAL_TABLET | Freq: Every evening | ORAL | Status: DC
  Administered 2021-02-08 – 2021-02-14 (×7): 80 mg via ORAL
  Filled 2021-02-08 (×7): qty 1

## 2021-02-08 MED ORDER — ASPIRIN EC 81 MG PO TBEC
81.0000 mg | DELAYED_RELEASE_TABLET | Freq: Every day | ORAL | Status: DC
  Administered 2021-02-08 – 2021-02-14 (×7): 81 mg via ORAL
  Filled 2021-02-08 (×7): qty 1

## 2021-02-08 MED ORDER — ACETAMINOPHEN 325 MG PO TABS
650.0000 mg | ORAL_TABLET | ORAL | Status: DC | PRN
  Administered 2021-02-09 – 2021-02-14 (×9): 650 mg via ORAL
  Filled 2021-02-08 (×10): qty 2

## 2021-02-08 MED ORDER — DEXTROSE 10 % IV SOLN: INTRAVENOUS | Status: AC

## 2021-02-08 MED ORDER — SODIUM CHLORIDE 0.9 % IV SOLN: 250.0000 mL | INTRAVENOUS | Status: DC | PRN

## 2021-02-08 MED ORDER — ONDANSETRON HCL 4 MG/2ML IJ SOLN: 4.0000 mg | Freq: Four times a day (QID) | INTRAMUSCULAR | Status: DC | PRN

## 2021-02-08 MED ORDER — DEXTROSE 10 % IV SOLN: INTRAVENOUS | Status: DC

## 2021-02-08 MED ORDER — SODIUM CHLORIDE 0.9% FLUSH: 3.0000 mL | INTRAVENOUS | Status: DC | PRN

## 2021-02-08 MED ORDER — DEXTROSE 50 % IV SOLN: 1.0000 | INTRAVENOUS | Status: DC | PRN

## 2021-02-08 MED ORDER — FUROSEMIDE 10 MG/ML IJ SOLN
40.0000 mg | Freq: Two times a day (BID) | INTRAMUSCULAR | Status: DC
  Administered 2021-02-08 – 2021-02-10 (×5): 40 mg via INTRAVENOUS
  Filled 2021-02-08 (×5): qty 4

## 2021-02-08 MED ORDER — SODIUM CHLORIDE 0.9% FLUSH
3.0000 mL | Freq: Two times a day (BID) | INTRAVENOUS | Status: DC
  Administered 2021-02-08 – 2021-02-14 (×7): 3 mL via INTRAVENOUS

## 2021-02-08 MED ORDER — DEXTROSE 50 % IV SOLN
INTRAVENOUS | Status: AC
  Filled 2021-02-08: qty 50

## 2021-02-08 MED ORDER — PRASUGREL HCL 10 MG PO TABS
10.0000 mg | ORAL_TABLET | Freq: Every day | ORAL | Status: DC
  Administered 2021-02-09 – 2021-02-14 (×6): 10 mg via ORAL
  Filled 2021-02-08 (×6): qty 1

## 2021-02-08 MED ORDER — RAMELTEON 8 MG PO TABS
8.0000 mg | ORAL_TABLET | Freq: Every day | ORAL | Status: DC
  Administered 2021-02-08 – 2021-02-13 (×5): 8 mg via ORAL
  Filled 2021-02-08 (×7): qty 1

## 2021-02-08 MED ORDER — ENOXAPARIN SODIUM 40 MG/0.4ML IJ SOSY
40.0000 mg | PREFILLED_SYRINGE | INTRAMUSCULAR | Status: DC
  Administered 2021-02-08: 40 mg via SUBCUTANEOUS
  Filled 2021-02-08: qty 0.4

## 2021-02-08 NOTE — ED Triage Notes (Signed)
EMS arrival from home co SOB, chest pressure, and abd swelling. Per EMS pt has hx CHF noncompliant with medication and CPAP due to insurance issues. Pt denies headache and dizziness. 324mg  Aspirin administered, 20G L AC

## 2021-02-08 NOTE — ED Notes (Signed)
Pt remains alert and oriented.   

## 2021-02-08 NOTE — ED Provider Notes (Signed)
[facility] EMERGENCY DEPARTMENT Provider Note   CSN: [id number] Arrival date & time: 02/08/21  4315     History Chief Complaint  Patient presents with  . Shortness of Breath  . Chest Pain    Brenda [name] is a 60 y.o. female.  HPI   60 year old female history of morbid obesity, coronary artery disease, diabetes, congestive heart failure, hypertension presents today with increasing dyspnea over the past several weeks.  She denies any specific onset of symptoms.  She states that has been gradual.  She has orthopnea and dyspnea with exertion.  She has had some ongoing cough without change.  It is nonproductive.  She has not had any fever or chills.  She describes this as similar to her CHF episode in the past.  Feels that she has put on weight and has more swelling in her abdomen and legs but is unable to tell me what her dry weight is.  Also been using her medications every other day as she does not have a new prescription.  She has had financial difficulties limiting her access to healthcare.  She has had a COVID vaccine but has not had a booster.  Past Medical History:  Diagnosis Date  . Arthritis    "back, hands, hips" (02/22/2015)  . CAD (coronary artery disease)    a. complex LAD/diagonal bifurcation PCI in 2010. b. STEMI 10/2019 s/p PTCA/DES x1 to mLAD overlapping the old stent, residual disease treated medicaly.  . Depression   . Diabetes mellitus ([place])    "started when I was pregnant; not sure if it was type 1 or type 2"   . Hypercholesterolemia   . Hypertension   . Morbid obesity ([place])   . Sleep apnea    "suppose to wear mask; I don't" (02/22/2015)    Patient Active Problem List   Diagnosis Date Noted  . Acute ST elevation myocardial infarction (STEMI) of anterolateral wall ([place]) 10/26/2019  . Acute ST elevation myocardial infarction (STEMI) due to occlusion of left anterior descending (LAD) coronary artery ([place]) 10/26/2019  . Anxiety and  depression 03/28/2016  . Type 2 diabetes mellitus with complication, with long-term current use of insulin ([place]) 02/23/2015  . Morbid obesity ([place]) 02/22/2015  . Hyperglycemia 02/22/2015  . Dyspnea 03/11/2013  . Hyperlipidemia associated with type 2 diabetes mellitus ([place]) 05/25/2009  . CAD S/P PCI of LAD 05/25/2009  . Essential hypertension 05/24/2009  . Sleep apnea 05/24/2009    Past Surgical History:  Procedure Laterality Date  . CARDIAC CATHETERIZATION  12/2012  . CHOLECYSTECTOMY OPEN  1989  . CORONARY ANGIOPLASTY WITH STENT PLACEMENT  2011  . CORONARY/GRAFT ACUTE MI REVASCULARIZATION N/A 10/26/2019   Procedure: CORONARY/GRAFT ACUTE MI REVASCULARIZATION;  Surgeon: Burnell Blanks, MD;  Location: [place] [facility];  Service: Cardiovascular;  Laterality: N/A;  . HERNIA REPAIR    . LEFT HEART CATH AND CORONARY ANGIOGRAPHY N/A 10/26/2019   Procedure: LEFT HEART CATH AND CORONARY ANGIOGRAPHY;  Surgeon: Burnell Blanks, MD;  Location: [place] [facility];  Service: Cardiovascular;  Laterality: N/A;  . LEFT HEART CATHETERIZATION WITH CORONARY ANGIOGRAM N/A 12/25/2012   Procedure: LEFT HEART CATHETERIZATION WITH CORONARY ANGIOGRAM;  Surgeon: Sherren Mocha, MD;  Location: [facility] C[facility];  Service: Cardiovascular;  Laterality: N/A;  . UMBILICAL HERNIA REPAIR  1990's     OB History   No obstetric history on file.     Family History  Problem Relation Age of Onset  . Heart attack Father  in his mid 92s  . COPD Father   . Coronary artery disease Mother        in her mid 26s  . Hypertension Mother   . Cancer Mother   . Diabetes Sister   . Coronary artery disease Sister        valve replacement  . Stroke Sister        multiple    Social History   Tobacco Use  . Smoking status: Former Smoker    Packs/day: 0.50    Years: 1.00    Pack years: 0.50    Types: Cigarettes, Cigars    Quit date: 09/03/1986    Years since quitting: 34.4  . Smokeless tobacco: Never Used   Substance Use Topics  . Alcohol use: No  . Drug use: No    Home Medications Prior to Admission medications   Medication Sig Start Date End Date Taking? Authorizing Provider  aspirin EC 81 MG tablet Take 1 tablet (81 mg total) by mouth daily. 10/28/19   Dunn, Nedra Hai, PA-C  atorvastatin (LIPITOR) 80 MG tablet Take 1 tablet (80 mg total) by mouth every evening. 12/04/19   Richardson Dopp T, PA-C  Blood Glucose Monitoring Suppl (TRUE METRIX METER) DEVI 1 each by Does not apply route 3 (three) times daily. 02/28/15   Charlott Rakes, MD  Ferrous Sulfate (IRON PO) Take 20 mg by mouth daily.    [provider]  glucose blood (TRUE METRIX BLOOD GLUCOSE TEST) test strip Use as instructed 02/28/15   Charlott Rakes, MD  insulin isophane & regular human (NOVOLIN 70/30 FLEXPEN) (70-30) 100 UNIT/ML KwikPen Inject subcutaneously twice daily 55 units in the morning and 50 units in the evening Patient taking differently: Inject subcutaneously twice daily 60 units in the morning and 60 units in the evening 11/05/19   Charlott Rakes, MD  Insulin Pen Needle (NOVOFINE) 30G X 8 MM MISC Use as directed with Novolin 70/30 insulin 10/28/19   Dunn, Dayna N, PA-C  lisinopril (ZESTRIL) 2.5 MG tablet Take 1 tablet (2.5 mg total) by mouth daily. 01/01/20 03/31/20  Richardson Dopp T, PA-C  metoprolol tartrate (LOPRESSOR) 25 MG tablet Take 1 tablet (25 mg total) by mouth 2 (two) times daily. 12/04/19   Richardson Dopp T, PA-C  nitroGLYCERIN (NITROSTAT) 0.4 MG SL tablet Place 1 tablet (0.4 mg total) under the tongue every 5 (five) minutes as needed for chest pain (up to 3 doses. If taking 3rd dose call 911). 10/28/19   Dunn, Nedra Hai, PA-C  prasugrel (EFFIENT) 10 MG TABS tablet Take 1 tablet (10 mg total) by mouth daily. Pt needs to make appt with provider for further refills - 1st attempt 12/13/20   Liliane Shi, PA-C  ticagrelor (BRILINTA) 90 MG TABS tablet Take 1 tablet (90 mg total) by mouth 2 (two) times daily. 10/28/19   Dunn,  Nedra Hai, PA-C  TRUEPLUS LANCETS 30G MISC 1 each by Does not apply route 3 (three) times daily. 02/28/15   Charlott Rakes, MD    Allergies    Hydrocodone, Clopidogrel bisulfate, and Hydrocodeine [dihydrocodeine]  Review of Systems   Review of Systems  All other systems reviewed and are negative.   Physical Exam Updated Vital Signs BP (!) 131/53 (BP Location: Right Arm)   Pulse 64   Temp 97.7 F (36.5 C) (Oral)   Resp (!) 23   SpO2 96%   Physical Exam Vitals and nursing note reviewed.  Constitutional:      General: She  is not in acute distress.    Appearance: She is obese. She is not ill-appearing.  HENT:     Head: Normocephalic.     Mouth/Throat:     Mouth: Mucous membranes are moist.  Eyes:     Pupils: Pupils are equal, round, and reactive to light.  Cardiovascular:     Rate and Rhythm: Normal rate.  Pulmonary:     Effort: Pulmonary effort is normal.     Breath sounds: Examination of the right-lower field reveals decreased breath sounds. Examination of the left-lower field reveals decreased breath sounds. Decreased breath sounds present.  Musculoskeletal:        General: Normal range of motion.     Right lower leg: Edema present.     Left lower leg: Edema present.  Skin:    Capillary Refill: Capillary refill takes less than 2 seconds.     Comments: Thickened skin throughout abdomen  Neurological:     General: No focal deficit present.     Mental Status: She is alert.     ED Results / Procedures / Treatments   Labs (all labs ordered are listed, but only abnormal results are displayed) Labs Reviewed  RESP PANEL BY RT-PCR (FLU A&B, COVID) ARPGX2  CBC  COMPREHENSIVE METABOLIC PANEL  BRAIN NATRIURETIC PEPTIDE  TROPONIN I (HIGH SENSITIVITY)    EKG EKG Interpretation  Date/Time:  Wednesday February 08 2021 07:03:16 EDT Ventricular Rate:  62 PR Interval:  190 QRS Duration: 132 QT Interval:  458 QTC Calculation: 466 R Axis:   -85 Text Interpretation: Sinus  rhythm Right bundle branch block Inferior infarct, old Anterolateral infarct, age indeterminate Confirmed by Pattricia Boss [phone]) on 02/08/2021 9:02:16 AM   Radiology DG Chest Port 1 View  Result Date: 02/08/2021 CLINICAL DATA:  Dyspnea EXAM: PORTABLE CHEST 1 VIEW COMPARISON:  CT chest abdomen and pelvis 10/26/2019 FINDINGS: The heart is enlarged. Mild pulmonary vascular congestion. The right hemidiaphragm is elevated, unchanged from prior exam. Lungs are clear. IMPRESSION: No significant interval change since prior examination with predominant findings of elevated right hemidiaphragm and mild cardiomegaly. Electronically Signed   By: Miachel Roux M.D.   On: 02/08/2021 08:07    Procedures Procedures   Medications Ordered in ED Medications  furosemide (LASIX) injection 40 mg (has no administration in time range)    ED Course  I have reviewed the triage vital signs and the nursing notes.  Pertinent labs & imaging results that were available during my care of the patient were reviewed by me and considered in my medical decision making (see chart for details).    MDM Rules/Calculators/A&P                          Patient felt generally weak and like her blood sugar was low.  Blood sugar checked here and 28.  He was given amp of D50 and blood sugar increased to 58.  She is being given p.o. Patient presents today with dyspnea.  1-Dyspnea- Work-up here reveals patient with intermittently low sats.  She is not tachycardic.  She has some mild cardiomegaly and elevated BNP at 302.  EKG with right bundle branch was not acutely ischemic.  Troponin is normal. COVID test done and negative.  No evidence of dvt, pe, and not tachycardiac but will add d-dimer 2-While in department patient has had intermittent hypoglycemia requiring.parenteral close administration despite taking orals.  Is unclear why her blood sugar has been dropping.  She  reports taking her normal insulin dose and not having any decrease  in p.o. intake, nausea, or vomiting 3-Patient with leukocytosis of 12,600- no complaints of infectious etiology, will add urinalysis  4- She is also anemic with a hemoglobin of 9.7 -patient does not report any bleeding or signs of GI bleeding..  She does not report NT blood thinners but is taking aspirin. Hemocult added. 11:12 AM hemocult negative Discussed with admitting team Final Clinical Impression(s) / ED Diagnoses Final diagnoses:  Dyspnea, unspecified type  Hypoglycemia    Rx / DC Orders ED Discharge Orders    None       Pattricia Boss, MD 02/08/21 1326

## 2021-02-08 NOTE — Progress Notes (Signed)
  Echocardiogram 2D Echocardiogram has been performed.  Michaell Grider G Rawson Minix 02/08/2021, 3:09 PM

## 2021-02-08 NOTE — ED Notes (Signed)
EDP aware of CBG result. Pt drowsy at this time. VRBO for 1 amp D50.

## 2021-02-08 NOTE — ED Notes (Signed)
1amp D50 given 

## 2021-02-08 NOTE — H&P (Addendum)
Date: 02/08/2021               Patient Name:  Brenda [name] MRN: [id number]  DOB: [birth year] Age / Sex: 60 y.o., female   PCP: Charlott Rakes, MD         Medical Service: Internal Medicine Teaching Service         Attending Physician: Dr. Sid Falcon, MD    First Contact: Dr. Rosita Kea Pager: [phone]  Second Contact: Dr. Court Joy Pager: [phone]       After Hours (After 5p/  First Contact Pager: [phone]  weekends / holidays): Second Contact Pager: [phone]   Chief Complaint: Swelling  History of Present Illness:  Brenda [name] is a 60 yo F w/ PMH of morbid obesity, OSA diabetes, PCOS, HTN, STEMI s/p LAD DES, presenting to [facility] w/ complaint of leg swelling. She was in her usual state of health until 2 weeks ago when she began to experience worsening swelling of her legs without obvious inciting event. She took some 'over-the-counter water pills' (diurex?) without significant improvement with her swelling although she did experience increasing urinary frequency. Her swelling increased with worsening dyspnea and when she noticed swelling reaching up to her stomach, she came to ED for evaluation.   She mentions that she has not had regular follow up with her PCP due to lack of insurance and she has been rationing her home medications which include her insulin, atorvastatin, aspirin and prasugrel. She mentions that she has never been on diuretics. She also has history of OSA diagnosed 20 years prior but has not been able to tolerate CPAP and has not been using CPAP for the last 18 years. She also mentions being unable to afford a new machine due to her lack of insurance.  On review of systems, she denies any fevers, chills, nausea, vomiting, diarrhea, cough. She does endorse orthopnea, weakness, dyspnea with exertion. She denies any sick contact. Denies any dietary changes.  Chart review shows admission on 10/2019 with acute STEMI requiring DES placement in LAD. Follow up visits show she was  unable to afford the Brilinta prescribed to her and she was switched to Prasugrel with aspirin for DAPT for easier access.  Meds:  Aspirin 81mg  daily Atorvastatin 80mg  daily Ferrous sulfate 20mg  daily Insulin regular 30 units qc Insulin NPH 50 units BID Metoprolol tartrate 25mg  BID Lisinopril 2.5mg  daily Nitroglycerin 0.4mg  qhs  Allergies: Allergies as of 02/08/2021 - Review Complete 02/08/2021  Allergen Reaction Noted  . Hydrocodone Shortness Of Breath 05/31/2016  . Clopidogrel bisulfate  05/25/2009  . Hydrocodeine [dihydrocodeine]  02/22/2015   Past Medical History:  Diagnosis Date  . Arthritis    "back, hands, hips" (02/22/2015)  . CAD (coronary artery disease)    a. complex LAD/diagonal bifurcation PCI in 2010. b. STEMI 10/2019 s/p PTCA/DES x1 to mLAD overlapping the old stent, residual disease treated medicaly.  . Depression   . Diabetes mellitus ([place])    "started when I was pregnant; not sure if it was type 1 or type 2"   . Hypercholesterolemia   . Hypertension   . Morbid obesity ([place])   . Sleep apnea    "suppose to wear mask; I don't" (02/22/2015)   Family History:  Both parents with heart attack in 106s. Sister had stroke recently.  Social History: Lives with niece after receiving custody due to sister having stroke. Denies any alcohol, tobacco, illicit substance use.  Review of Systems: A complete ROS was negative except as  per HPI.  Physical Exam: Blood pressure (!) 95/43, pulse (!) 55, temperature (!) 97.4 F (36.3 C), temperature source Rectal, resp. rate (!) 22, SpO2 95 %.  Gen: Well-developed, morbidly obese, NAD HEENT: NCAT head, hearing intact, EOMI, +hirsutism Neck: supple, ROM intact, unable to assess JVD CV: RRR, S1, S2 normal, No rubs, no murmurs, no gallops Pulm: Distant breath sounds, no rales, no wheezes Abd: Soft, BS+, Non-tender, Distended with abdominal wall edema Extm: ROM intact, Peripheral pulses intact, bilateral 2+ peripheral edema up  proximal thighs Skin: Dry, Warm Neuro: AAOx3  EKG: personally reviewed my interpretation is low voltage EKG, T-wave inversions on V1-V4 similar to prior EKG.  CXR: personally reviewed my interpretation is poorly penetrated film, low inspiratory effort, no pleural effusion or lobar consolidation  Assessment & Plan by Problem: Active Problems:   Acute on chronic diastolic (congestive) heart failure ([place])  Brenda [name] is a 60 yo F w/ PMH of morbid obesity, OSA, diabetes, PCOS, HTN, STEMI s/p LAD DES, presenting to [facility] with leg swelling likely due to acute on chronic diastolic heart failure  Anasarca 2/2 diastolic heart failure Presenting with new complaint of leg swelling. Prior Echo on 10/2019 showing EF 14-78%, grade 1 diastolic dysfunction. PMH significant for STEMI, OSA, morbid obesity. BNP elevated at 302.6. Likely due to right sided heart failure in setting of untreated OSA. Could also have had new MI with history of non-adherence to anti-platelet therapy but hsTrop negative, EKG w/o new ischemic changes. Wells score of 1 with d-dimer elevated at 1.52. Will r/o DVT although lower on differential - Lower extremity US - Echocardiogram - Start IV Furosemide 40mg  BID - Trend BMP - Mag level - Strict I&Os - Daily Weights - Fluid restriction - Keep O2 sat >88 - Replenish K as needed >4.0 - Holding home ACE inhibitor, beta-blocker in setting of hypotension  CAD Hx of STEMI s/p LAD DES Admission on 10/2019 with STEMI. Had LAD placed in DES. Denies any chest pain. Hx of med non-adherence due to lack of insurance and financial barrier. Currently on DAPT with aspirin, prasugrel although mentions rationing it and taking every other day. - C/w home meds - May be able to come off DAPT considering it has been more than 12 months since DES placement but will await til Echo  Iatrogenic hypoglycemia Diabetes Mellitus Mentions taking 30 units of insulin regular this morning as well as 50 units of  insulin NPH. Did not eat breakfast. Suspect persistent hypoglycemia due to over-administration of insulin w/o oral intake, with both short-acting and intermediate-acting insulin. Other differential includes adrenal insufficiency or insulinoma, although low suspicion considering history. Can further work-up if hypoglycemia persists. - Cbg q1hr - Brief D10 infusion - D50 bolus as needed - Hgb a1c  Microcytic Anemia likely 2/2 iron deficiency Previously on iron supplementation. Had irregular oral repletion due to cost issues. Hemoglobin 9.7, MCV 76.8 - Trend cbc - iron panel - ferritin  Obstructive Sleep Apnea Diagnosed 20 years ago. Unable to review prior sleep study. Mentions non-adherence to CPAP during last 18 years due to lack of insurance and discomfort with face mask - CPAP qhs  PCOS Notable hirsutism on exam - F/u ob/gyn outpatient  DVT prophx: lovenox Diet: low salt Bowel: Miralax Code: Full  Prior to Admission Living Arrangement: Home Anticipated Discharge Location: Home vs SNF Barriers to Discharge: Medical work-up  Dispo: Admit patient to Inpatient with expected length of stay greater than 2 midnights.  Signed: Mosetta Anis, MD 02/08/2021, 12:37  PM Pager: [phone] After 5pm on weekdays and 1pm on weekends: On Call Pager: [phone]

## 2021-02-08 NOTE — ED Notes (Signed)
Pt told NT she felt as if her glucose had dropped. NT took CBG with result of 28. EDP made aware and 1 amp of D50 given per MD verbal order.

## 2021-02-08 NOTE — Progress Notes (Signed)
Pt requests med for sleep. Pt states she sometimes takes Benadryl, but this med is not listed on her home MAR. MD paged.

## 2021-02-08 NOTE — ED Notes (Signed)
Pt given soda and cracker per Dr. Jeanell Sparrow verbal order.

## 2021-02-09 ENCOUNTER — Inpatient Hospital Stay (HOSPITAL_COMMUNITY): Payer: Medicaid Other

## 2021-02-09 DIAGNOSIS — I5032 Chronic diastolic (congestive) heart failure: Secondary | ICD-10-CM

## 2021-02-09 DIAGNOSIS — E162 Hypoglycemia, unspecified: Secondary | ICD-10-CM

## 2021-02-09 DIAGNOSIS — R0600 Dyspnea, unspecified: Secondary | ICD-10-CM

## 2021-02-09 DIAGNOSIS — M7989 Other specified soft tissue disorders: Secondary | ICD-10-CM

## 2021-02-09 DIAGNOSIS — I5030 Unspecified diastolic (congestive) heart failure: Secondary | ICD-10-CM

## 2021-02-09 LAB — MAGNESIUM: Magnesium: 2 mg/dL (ref 1.7–2.4)

## 2021-02-09 LAB — HEMOGLOBIN A1C
Hgb A1c MFr Bld: 8.9 % — ABNORMAL HIGH (ref 4.8–5.6)
Mean Plasma Glucose: 209 mg/dL

## 2021-02-09 LAB — BASIC METABOLIC PANEL
Anion gap: 9 (ref 5–15)
BUN: 11 mg/dL (ref 6–20)
CO2: 32 mmol/L (ref 22–32)
Calcium: 8.3 mg/dL — ABNORMAL LOW (ref 8.9–10.3)
Chloride: 97 mmol/L — ABNORMAL LOW (ref 98–111)
Creatinine, Ser: 0.99 mg/dL (ref 0.44–1.00)
GFR, Estimated: >60 mL/min (ref >60)
Glucose, Bld: 65 mg/dL — ABNORMAL LOW (ref 70–99)
Potassium: 4.4 mmol/L (ref 3.5–5.1)
Sodium: 138 mmol/L (ref 135–145)

## 2021-02-09 LAB — GLUCOSE, CAPILLARY
Glucose-Capillary: 252 mg/dL — ABNORMAL HIGH (ref 70–99)
Glucose-Capillary: 320 mg/dL — ABNORMAL HIGH (ref 70–99)
Glucose-Capillary: 411 mg/dL — ABNORMAL HIGH (ref 70–99)
Glucose-Capillary: 416 mg/dL — ABNORMAL HIGH (ref 70–99)
Glucose-Capillary: 61 mg/dL — ABNORMAL LOW (ref 70–99)
Glucose-Capillary: 75 mg/dL (ref 70–99)

## 2021-02-09 MED ORDER — ENOXAPARIN SODIUM 80 MG/0.8ML IJ SOSY
0.5000 mg/kg | PREFILLED_SYRINGE | INTRAMUSCULAR | Status: DC
  Administered 2021-02-09 – 2021-02-13 (×5): 80 mg via SUBCUTANEOUS
  Filled 2021-02-09 (×5): qty 0.8

## 2021-02-09 MED ORDER — SODIUM CHLORIDE 0.9 % IV SOLN
510.0000 mg | INTRAVENOUS | Status: DC
  Administered 2021-02-09: 510 mg via INTRAVENOUS
  Filled 2021-02-09: qty 17

## 2021-02-09 MED ORDER — INSULIN GLARGINE 100 UNIT/ML ~~LOC~~ SOLN
20.0000 [IU] | Freq: Every day | SUBCUTANEOUS | Status: DC
  Filled 2021-02-09: qty 0.2

## 2021-02-09 MED ORDER — INSULIN GLARGINE 100 UNIT/ML ~~LOC~~ SOLN
32.0000 [IU] | Freq: Every day | SUBCUTANEOUS | Status: DC
  Administered 2021-02-09: 32 [IU] via SUBCUTANEOUS
  Filled 2021-02-09 (×2): qty 0.32

## 2021-02-09 MED ORDER — CHLORHEXIDINE GLUCONATE CLOTH 2 % EX PADS: 6.0000 | MEDICATED_PAD | Freq: Every day | CUTANEOUS | Status: DC

## 2021-02-09 MED ORDER — SODIUM CHLORIDE 0.9% FLUSH: 10.0000 mL | INTRAVENOUS | Status: DC | PRN

## 2021-02-09 MED ORDER — INSULIN ASPART 100 UNIT/ML IJ SOLN
0.0000 [IU] | INTRAMUSCULAR | Status: DC
  Administered 2021-02-09: 20 [IU] via SUBCUTANEOUS
  Administered 2021-02-10: 7 [IU] via SUBCUTANEOUS
  Administered 2021-02-10 (×2): 4 [IU] via SUBCUTANEOUS
  Administered 2021-02-10: 15 [IU] via SUBCUTANEOUS
  Administered 2021-02-10: 7 [IU] via SUBCUTANEOUS
  Administered 2021-02-11: 3 [IU] via SUBCUTANEOUS
  Administered 2021-02-11 (×2): 4 [IU] via SUBCUTANEOUS
  Administered 2021-02-11: 7 [IU] via SUBCUTANEOUS
  Administered 2021-02-11: 4 [IU] via SUBCUTANEOUS
  Administered 2021-02-11: 3 [IU] via SUBCUTANEOUS
  Administered 2021-02-12: 7 [IU] via SUBCUTANEOUS
  Administered 2021-02-12: 11 [IU] via SUBCUTANEOUS
  Administered 2021-02-12: 7 [IU] via SUBCUTANEOUS
  Administered 2021-02-12: 3 [IU] via SUBCUTANEOUS
  Administered 2021-02-12: 11 [IU] via SUBCUTANEOUS
  Administered 2021-02-13: 4 [IU] via SUBCUTANEOUS
  Administered 2021-02-13: 11 [IU] via SUBCUTANEOUS
  Administered 2021-02-13: 7 [IU] via SUBCUTANEOUS
  Administered 2021-02-13 (×2): 4 [IU] via SUBCUTANEOUS
  Administered 2021-02-14: 3 [IU] via SUBCUTANEOUS
  Administered 2021-02-14: 15 [IU] via SUBCUTANEOUS
  Administered 2021-02-14: 7 [IU] via SUBCUTANEOUS

## 2021-02-09 MED ORDER — SODIUM CHLORIDE 0.9% FLUSH
10.0000 mL | Freq: Two times a day (BID) | INTRAVENOUS | Status: DC
  Administered 2021-02-09: 10 mL
  Administered 2021-02-10: 40 mL
  Administered 2021-02-10 – 2021-02-11 (×3): 10 mL

## 2021-02-09 MED ORDER — INSULIN ASPART 100 UNIT/ML IJ SOLN
0.0000 [IU] | Freq: Three times a day (TID) | INTRAMUSCULAR | Status: DC
  Administered 2021-02-09: 11 [IU] via SUBCUTANEOUS

## 2021-02-09 NOTE — Progress Notes (Addendum)
Subjective: O/E- None Pt is seen at rounds today. She states she is feeling better that yesterday. Feels that sitting makes her breathing better. Pt used CPAP at night states she had some difficulty in adjusting to mask but overall did well. She noted some improvement in her edema. States she had last sleep study done >20 years ago. She doesn't use any CPAP at home. She denies any headache. Reports some nausea. Explained about ECHO report showing Rt heart failure likely due to not using CPAP. She feels anxious because of sudden development of swelling.  Objective:  Vital signs in last 24 hours: Vitals:   02/09/21 0019 02/09/21 0253 02/09/21 0509 02/09/21 0731  BP: (!) 92/54  122/65 (!) 106/45  Pulse: (!) 58  67 75  Resp: 18  19 19   Temp: 98.3 F (36.8 C)  (!) 97.5 F (36.4 C) 97.9 F (36.6 C)  TempSrc: Oral  Oral Oral  SpO2: 98%  97% 90%  Weight:  (!) 160.4 kg    Height:        CBC Latest Ref Rng & Units 02/08/2021 02/08/2021 10/27/2019  WBC 4.0 - 10.5 K/uL 11.7(H) 12.6(H) 10.7(H)  Hemoglobin 12.0 - 15.0 g/dL 9.2(L) 9.7(L) 15.4(H)  Hematocrit 36.0 - 46.0 % 34.9(L) 38.0 48.3(H)  Platelets 150 - 400 K/uL 380 446(H) 302     CMP Latest Ref Rng & Units 02/09/2021 02/08/2021 12/16/2019  Glucose 70 - 99 mg/dL 65(L) 33(LL) 322(H)  BUN 6 - 20 mg/dL 11 8 9   Creatinine 0.44 - 1.00 mg/dL 0.99 0.81 0.81  Sodium 135 - 145 mmol/L 138 137 138  Potassium 3.5 - 5.1 mmol/L 4.4 3.9 4.9  Chloride 98 - 111 mmol/L 97(L) 101 99  CO2 22 - 32 mmol/L 32 27 24  Calcium 8.9 - 10.3 mg/dL 8.3(L) 8.6(L) 9.2  Total Protein 6.5 - 8.1 g/dL - 7.0 7.0  Total Bilirubin 0.3 - 1.2 mg/dL - 0.5 0.3  Alkaline Phos 38 - 126 U/L - 114 125(H)  AST 15 - 41 U/L - 23 15  ALT 0 - 44 U/L - 18 11   Mg 2  Physical Exam Vitals and nursing note reviewed.  Constitutional:      General: She is not in acute distress.    Appearance: She is obese. She is not ill-appearing, toxic-appearing or diaphoretic.  HENT:     Head:  Normocephalic and atraumatic.     Comments: Hirsutism present on face Cardiovascular:     Rate and Rhythm: Normal rate and regular rhythm.  Pulmonary:     Effort: Pulmonary effort is normal.     Breath sounds: Normal breath sounds. No wheezing, rhonchi or rales.  Musculoskeletal:     Cervical back: Normal range of motion.     Right lower leg: No tenderness. Edema present.     Left lower leg: No tenderness. Edema present.  Skin:    General: Skin is warm and dry.  Neurological:     General: No focal deficit present.     Mental Status: She is alert and oriented to person, place, and time.  Psychiatric:        Mood and Affect: Mood is anxious.        Behavior: Behavior normal.   Assessment/Plan: Brenda [name] is a 60 y.o. female with hx of morbid obesity, OSA, diabetes, PCOS, HTN, STEMI s/p LAD DES, admitted with leg swelling likely due to Acute Right heart failure.   Active problems Acute Right Heart failure  Anasarca 2/2 Rt heart failure Pt still has pitting edema on b/l legs. Reports some improvement. ECHO showed LVEF 60 to 65% with normal LVF with no regional wall motion abnormalities. Other risk factors include STEMI, morbid obesity.  BP running low at night in low 54'Y systolic, BP this morning, 122/65 mmHg, sating well at room air. She had net output of -1703 ml and lost 3 pounds since yesterday.  - Lower extremity US to rule out DVT- pending.  - Continue IV Furosemide 40mg  BID - Monitor vitals closely as Pt can go in hypotension.  - BMP Daily with Mg - Strict I&Os - Daily Weights - Fluid restriction - Keep O2 sat >88 - Replenish K as needed >4.0 - Holding home ACE inhibitor, beta-blocker in setting of hypotension.  Obstructive Sleep Apnea Used CPAP last night. States she had some difficulty in adjusting to mask but overall did well. OSA most likely the cause of Right heart failure. OSA diagnosed 20 years ago. Mentions non-adherence to CPAP due to lack of insurance  and discomfort with face mask. -CPAP nightly -Respiratory therapist consult for CPAP titration. -Will need insurance approval for home CPAP machine and appropriate mask.   CAD Hx of STEMI s/p LAD DES Admission on 10/2019 with STEMI. Had LAD placed in DES. Denies any chest pain. Currently on DAPT with aspirin, prasugrel (Effient) although not taking consistently.  - Continue home DAPT meds for now.  -No mention about stopping DAPT in last cardiology note. Will defer to Cardiology at Follow up. Will need f/u appointment at discharge.    Hypoglycemia Diabetes Mellitus HbA1c 8.9. CBG's 61 and 75 in the morning. Now 252.  -Start moderate Scale insulin.  Lantus 20 units nightly. -CBG's 4 times.    Microcytic Anemia likely 2/2 iron deficiency Previously on iron supplementation. Had irregular oral repletion due to cost issues. Hemoglobin 9.2,  MCV 74.9,  Iron 26, UIBC 464, TIBC 490, Saturation Ration 6, Ferritin low at 6. FOBT negative. - Getting Feraheme 2 doses - Trend cbc  PCOS Notable hirsutism on exam - F/u ob/gyn outpatient    Dispo: Anticipated discharge in approximately 2-3 day(s).   Armando Reichert, MD 02/09/2021, 11:19 AM Pager: [phone] After 5pm on weekdays and 1pm on weekends: Please call On Call pager [phone]

## 2021-02-09 NOTE — Progress Notes (Signed)
CBG = 416. MD paged.

## 2021-02-09 NOTE — Progress Notes (Signed)
OT Cancellation Note  Patient Details Name: Brenda [name] MRN: [id number] DOB: 1960/11/17   Cancelled Treatment:    Reason Eval/Treat Not Completed: Patient not medically ready- pt awaiting BLE dopplers to rule out DVT.  Will follow as see as able.   Jolaine Artist, OT Acute Rehabilitation Services Pager [phone] Office [phone]   [name] 02/09/2021, 12:58 PM

## 2021-02-09 NOTE — Progress Notes (Signed)
Bilateral lower extremity venous duplex has been completed. Preliminary results can be found in CV Proc through chart review.   02/09/21 1:47 PM Brenda [name] RVT

## 2021-02-09 NOTE — Progress Notes (Signed)
IMTS Update:  I was paged for CBG of 416. Patient had not yet received any evening insulin.   Plan: - Increased Lantus from 20 to 32 units nightly  - Increased SSI from moderate to resistant  - Instructed RN to repeat CBG 1 hour following above for close monitoring  - Will continue CBG monitoring q4hrs overnight given her presentation with hypoglycemia and alternative home insulins  Jeralyn Bennett, MD 02/09/2021, 10:59 PM Pager: [phone] After 5pm on weekdays and 1pm on weekends: On Call pager [phone]

## 2021-02-09 NOTE — Evaluation (Signed)
Physical Therapy Evaluation Patient Details Name: Vayla Wilhelmi MRN: [id number] DOB: 05-01-1961 Today's Date: 02/09/2021   History of Present Illness  Pt is a 60 y/o female presenting on 6/8/ 2022 to ED with complaint of leg swelling. CXR shows no significant interval change since prior examination with  predominant findings of elevated right hemidiaphragm and mild  cardiomegaly. PMH significant for morbid obesity, OSA diabetes, PCOS, HTN, STEMI s/p LAD DES  Clinical Impression  Pt presents to PT with deficits in functional mobility, gait, activity tolerance, and endurance. Pt fatigues quickly, although oxygen sats remain stable, and requires seated rest breaks to recover. Pt also requires cueing for use of rollator brakes to improve safety. PT provides encouragement for continuation and progression of home exercise program to improve activity tolerance and restore independence in community mobility.    Follow Up Recommendations No PT follow up;Supervision - Intermittent (PT encourages continued home exercise progression of chair yoga and daily walking which the pt reports taking part in)    Equipment Recommendations  Other (comment) (pt would benefit from a bariatric rollator)    Recommendations for Other Services       Precautions / Restrictions Precautions Precautions: Fall Restrictions Weight Bearing Restrictions: No      Mobility  Bed Mobility               General bed mobility comments: not assessed, pt received and left sitting at the edge of bed    Transfers Overall transfer level: Independent                  Ambulation/Gait Ambulation/Gait assistance: Supervision Gait Distance (Feet): 60 Feet (60' x 2 (initial 20' without device, rest of ambulation with bariatric rollator)) Assistive device:  (bariatric rollator) Gait Pattern/deviations: Step-through pattern;Wide base of support Gait velocity: reduced Gait velocity interpretation: 1.31 - 2.62  ft/sec, indicative of limited community ambulator General Gait Details: pt with slowed step-through gait, no LOB noted. Verbal cues needed for brake management of rollator  Stairs            Wheelchair Mobility    Modified Rankin (Stroke Patients Only)       Balance Overall balance assessment: Needs assistance Sitting-balance support: No upper extremity supported;Feet supported Sitting balance-Leahy Scale: Good     Standing balance support: No upper extremity supported Standing balance-Leahy Scale: Fair                               Pertinent Vitals/Pain Pain Assessment: No/denies pain    Home Living Family/patient expects to be discharged to:: Private residence Living Arrangements: Children (68 y.o. nephew whom the patient has custody of) Available Help at Discharge: Available 24 hours/day;Family Type of Home: House Home Access: Ramped entrance     Home Layout: One level Home Equipment: [place] - 4 wheels;Shower seat      Prior Function Level of Independence: Independent with assistive device(s)         Comments: pt ambulates in the home without device, utilizes a rollator for community mobility. Pt requires frequent breaks at baseline due to [place]        Extremity/Trunk Assessment   Upper Extremity Assessment Upper Extremity Assessment: Overall WFL for tasks assessed    Lower Extremity Assessment Lower Extremity Assessment: RLE deficits/detail;LLE deficits/detail RLE Deficits / Details: BLE edema noted LLE Deficits / Details: BLE edema noted    Cervical / Trunk  Assessment Cervical / Trunk Assessment: Normal  Communication   Communication: No difficulties  Cognition Arousal/Alertness: Awake/alert Behavior During Therapy: WFL for tasks assessed/performed Overall Cognitive Status: Within Functional Limits for tasks assessed                                        General Comments General comments  (skin integrity, edema, etc.): VSS on RA, sats ranging from 92-96% with ambulation. Pt does require seated rest break and demonstrates significant increase in work of breathing with mobility    Exercises     Assessment/Plan    PT Assessment Patient needs continued PT services  PT Problem List Decreased activity tolerance;Cardiopulmonary status limiting activity       PT Treatment Interventions DME instruction;Gait training;Therapeutic activities;Therapeutic exercise;Patient/family education    PT Goals (Current goals can be found in the Care Plan section)  Acute Rehab PT Goals Patient Stated Goal: to lose weight and improve activity tolerance PT Goal Formulation: With patient Time For Goal Achievement: 02/23/21 Potential to Achieve Goals: Good Additional Goals Additional Goal #1: Pt will ambulate for 100' reporting a DOE of 3/10 or less to demonstrate improved tolerance for mobility and ADLs.    Frequency Min 3X/week   Barriers to discharge        Co-evaluation               AM-PAC PT "6 Clicks" Mobility  Outcome Measure Help needed turning from your back to your side while in a flat bed without using bedrails?: A Little Help needed moving from lying on your back to sitting on the side of a flat bed without using bedrails?: A Little Help needed moving to and from a bed to a chair (including a wheelchair)?: None Help needed standing up from a chair using your arms (e.g., wheelchair or bedside chair)?: None Help needed to walk in hospital room?: A Little Help needed climbing 3-5 steps with a railing? : A Little 6 Click Score: 20    End of Session   Activity Tolerance: Patient limited by fatigue Patient left: in bed;with call bell/phone within reach Nurse Communication: Mobility status PT Visit Diagnosis: Other abnormalities of gait and mobility (R26.89)    Time: 6759-1638 PT Time Calculation (min) (ACUTE ONLY): 26 min   Charges:   PT Evaluation $PT Eval Low  Complexity: [place], PT, DPT Acute Rehabilitation Pager: [phone]   Zenaida Niece 02/09/2021, 11:00 AM

## 2021-02-10 DIAGNOSIS — I5033 Acute on chronic diastolic (congestive) heart failure: Secondary | ICD-10-CM

## 2021-02-10 LAB — CBC
HCT: 32.8 % — ABNORMAL LOW (ref 36.0–46.0)
Hemoglobin: 8.8 g/dL — ABNORMAL LOW (ref 12.0–15.0)
MCH: 19.9 pg — ABNORMAL LOW (ref 26.0–34.0)
MCHC: 26.8 g/dL — ABNORMAL LOW (ref 30.0–36.0)
MCV: 74.2 fL — ABNORMAL LOW (ref 80.0–100.0)
Platelets: 313 10*3/uL (ref 150–400)
RBC: 4.42 MIL/uL (ref 3.87–5.11)
RDW: 20.8 % — ABNORMAL HIGH (ref 11.5–15.5)
WBC: 9.2 10*3/uL (ref 4.0–10.5)
nRBC: 0.2 % (ref 0.0–0.2)

## 2021-02-10 LAB — PROTEIN / CREATININE RATIO, URINE
Creatinine, Urine: 69.84 mg/dL
Protein Creatinine Ratio: 0.19 mg/mg{Cre} — ABNORMAL HIGH (ref 0.00–0.15)
Total Protein, Urine: 13 mg/dL

## 2021-02-10 LAB — GLUCOSE, CAPILLARY
Glucose-Capillary: 384 mg/dL — ABNORMAL HIGH (ref 70–99)
Glucose-Capillary: 323 mg/dL — ABNORMAL HIGH (ref 70–99)
Glucose-Capillary: 260 mg/dL — ABNORMAL HIGH (ref 70–99)
Glucose-Capillary: 197 mg/dL — ABNORMAL HIGH (ref 70–99)
Glucose-Capillary: 192 mg/dL — ABNORMAL HIGH (ref 70–99)
Glucose-Capillary: 215 mg/dL — ABNORMAL HIGH (ref 70–99)
Glucose-Capillary: 231 mg/dL — ABNORMAL HIGH (ref 70–99)

## 2021-02-10 LAB — BASIC METABOLIC PANEL
Anion gap: 5 (ref 5–15)
BUN: 15 mg/dL (ref 6–20)
CO2: 34 mmol/L — ABNORMAL HIGH (ref 22–32)
Calcium: 8.2 mg/dL — ABNORMAL LOW (ref 8.9–10.3)
Chloride: 93 mmol/L — ABNORMAL LOW (ref 98–111)
Creatinine, Ser: 1.03 mg/dL — ABNORMAL HIGH (ref 0.44–1.00)
GFR, Estimated: >60 mL/min (ref >60)
Glucose, Bld: 320 mg/dL — ABNORMAL HIGH (ref 70–99)
Potassium: 4.4 mmol/L (ref 3.5–5.1)
Sodium: 132 mmol/L — ABNORMAL LOW (ref 135–145)

## 2021-02-10 MED ORDER — FUROSEMIDE 10 MG/ML IJ SOLN
40.0000 mg | Freq: Three times a day (TID) | INTRAMUSCULAR | Status: DC
  Administered 2021-02-10 – 2021-02-12 (×8): 40 mg via INTRAVENOUS
  Filled 2021-02-10 (×8): qty 4

## 2021-02-10 MED ORDER — INSULIN GLARGINE 100 UNIT/ML ~~LOC~~ SOLN
40.0000 [IU] | Freq: Every day | SUBCUTANEOUS | Status: DC
  Filled 2021-02-10: qty 0.4

## 2021-02-10 MED ORDER — INSULIN GLARGINE 100 UNIT/ML ~~LOC~~ SOLN
45.0000 [IU] | Freq: Every day | SUBCUTANEOUS | Status: DC
  Administered 2021-02-10 – 2021-02-13 (×4): 45 [IU] via SUBCUTANEOUS
  Filled 2021-02-10 (×5): qty 0.45

## 2021-02-10 MED ORDER — INSULIN ASPART 100 UNIT/ML IJ SOLN
5.0000 [IU] | Freq: Three times a day (TID) | INTRAMUSCULAR | Status: DC
  Administered 2021-02-10 – 2021-02-13 (×9): 5 [IU] via SUBCUTANEOUS

## 2021-02-10 MED ORDER — LISINOPRIL 5 MG PO TABS
2.5000 mg | ORAL_TABLET | Freq: Every day | ORAL | Status: DC
  Administered 2021-02-10 – 2021-02-14 (×5): 2.5 mg via ORAL
  Filled 2021-02-10 (×6): qty 1

## 2021-02-10 NOTE — Progress Notes (Addendum)
Subjective: O/E- [name] BG was high at 416, lantus increased to 32 units.  SSI changed from moderate to resistant scale.  Pt seen at rounds this morning.  Patient reports that there is not much improvement in edema.  She has been urinating more than usual. Reports improvement in breathing but sometimes need oxygen when she feels SOB. States she has been using CPAP at night. She woke up at night around 3 am and couldn't go back to sleep. Objective:  Vital signs in last 24 hours: Vitals:   02/09/21 1719 02/09/21 2041 02/09/21 2340 02/10/21 0357  BP: (!) 144/67 120/71 139/90 (!) 144/72  Pulse: 75 64 68 79  Resp: 19 20 20 18   Temp: 97.9 F (36.6 C) 97.8 F (36.6 C) 97.8 F (36.6 C) 97.8 F (36.6 C)  TempSrc: Oral Oral Oral Oral  SpO2: 95% 99% 91% 100%  Weight:    (!) 160.5 kg  Height:        CBC Latest Ref Rng & Units 02/10/2021 02/08/2021 02/08/2021  WBC 4.0 - 10.5 K/uL 9.2 11.7(H) 12.6(H)  Hemoglobin 12.0 - 15.0 g/dL 8.8(L) 9.2(L) 9.7(L)  Hematocrit 36.0 - 46.0 % 32.8(L) 34.9(L) 38.0  Platelets 150 - 400 K/uL 313 380 446(H)     CMP Latest Ref Rng & Units 02/10/2021 02/09/2021 02/08/2021  Glucose 70 - 99 mg/dL 320(H) 65(L) 33(LL)  BUN 6 - 20 mg/dL 15 11 8   Creatinine 0.44 - 1.00 mg/dL 1.03(H) 0.99 0.81  Sodium 135 - 145 mmol/L 132(L) 138 137  Potassium 3.5 - 5.1 mmol/L 4.4 4.4 3.9  Chloride 98 - 111 mmol/L 93(L) 97(L) 101  CO2 22 - 32 mmol/L 34(H) 32 27  Calcium 8.9 - 10.3 mg/dL 8.2(L) 8.3(L) 8.6(L)  Total Protein 6.5 - 8.1 g/dL - - 7.0  Total Bilirubin 0.3 - 1.2 mg/dL - - 0.5  Alkaline Phos 38 - 126 U/L - - 114  AST 15 - 41 U/L - - 23  ALT 0 - 44 U/L - - 18   Physical Exam Vitals and nursing note reviewed.  Constitutional:      General: She is not in acute distress.    Appearance: She is obese. She is not ill-appearing, toxic-appearing or diaphoretic.  HENT:     Head: Normocephalic and atraumatic.     Comments: Hirsutism present on face Cardiovascular:     Rate and Rhythm:  Normal rate and regular rhythm.  Pulmonary:     Effort: Pulmonary effort is normal.     Breath sounds: Normal breath sounds. No wheezing, rhonchi or rales.  Musculoskeletal:     Cervical back: Normal range of motion.     Right lower leg: No tenderness. Edema present.     Left lower leg: No tenderness. Edema present.  Skin:    General: Skin is warm and dry.  Neurological:     General: No focal deficit present.     Mental Status: She is alert and oriented to person, place, and time.  Psychiatric:        Mood and Affect: Mood is not anxious.        Behavior: Behavior normal.   Assessment/Plan: Keyona Emrich is a 60 y.o. female with hx of morbid obesity, OSA, diabetes, PCOS, HTN, STEMI s/p LAD DES, admitted with leg swelling likely due to Acute Right heart failure.   Active problems Acute Right Heart failure  Anasarca 2/2 Rt heart failure Mild metabolic alkalosis  Patient still has bilateral lower extremity pitting edema.  Patient reports that there is not much improvement.  Lower extremity ultrasound negative for DVT.  She has been urinating more than usual. vitals have been stable with BP 114/56 mmhg in the morning.  Reports some improvement in breathing.  Sometimes uses 3 L oxygen when short of breath. She had net output of -2026 (1073-3100 )and -3489 since admission and loss of 1.3 LB's since admission. -Increase IV Furosemide 40 mg to 3 times daily.  Sodium 132, potassium 4.4.  Mild metabolic acidosis(CO2 34, chloride 93)  likely due to diuresis.  Will monitor BMP. No OT/PT follow up needed.  - Monitor vitals closely as Pt can go in hypotension.  - BMP Daily with Mg - Strict I&Os - Daily Weights - Fluid restriction - Keep O2 sat >88 - Replenish K as needed >4.0 -Holding home beta-blocker in setting of hypotension. -Unna Boots.   Obstructive Sleep Apnea Used CPAP last night. OSA most likely the cause of Right heart failure. Talked to RT yesterday to help titrate  CPAP. -Continue CPAP nightly -We will talk to social worker and case manager for insurance approval for home CPAP machine and appropriate mask.   Hypoalbuminemia Albumin at admission 3. Protein to creatinine ratio mildly high at 0.19.  Urine creatinine 69.84, total urine protein 13.  24-hour urine creatinine 69.84.  -Will start home lisinopril.  CAD Hx of STEMI s/p LAD DES On DAPT (aspirin and prasugrel)  Continue home DAPT meds for now.  .-Will need f/u appointment at discharge.    Diabetes Mellitus HbA1c 8.9. CBG's high yesterday in 400s range at night.  This morning CBG 260, Blood glucose 320.  Moderate scale insulin changed to resistant scale at night.  Lantus changed from 20 to 32 units. -Continue Resistant scale insulin.  -Increase Lantus to 40 units nightly. -CBG's 4 times.    Microcytic Anemia likely 2/2 iron deficiency Previously on iron supplementation.  FOBT negative. Iron panel consistent with iron deficiency anemia with ferritin 6 . S/p Feraheme infusion. Hemoglobin 8.8 today.  - Monitor CBC.  Dispo: Anticipated discharge in approximately 2-3 day(s).   Armando Reichert, MD 02/10/2021, 6:01 AM Pager: [phone] After 5pm on weekdays and 1pm on weekends: Please call On Call pager [phone]

## 2021-02-10 NOTE — Progress Notes (Addendum)
Heart Failure Navigation Team Progress Note  PCP: Charlott Rakes, MD Primary Cardiologist: n/a Admitted from: home  Past Medical History:  Diagnosis Date   Arthritis    "back, hands, hips" (02/22/2015)   CAD (coronary artery disease)    a. complex LAD/diagonal bifurcation PCI in 2010. b. STEMI 10/2019 s/p PTCA/DES x1 to mLAD overlapping the old stent, residual disease treated medicaly.   Depression    Diabetes mellitus ([place])    "started when I was pregnant; not sure if it was type 1 or type 2"    Hypercholesterolemia    Hypertension    Morbid obesity ([place])    Sleep apnea    "suppose to wear mask; I don't" (02/22/2015)    Social History   Socioeconomic History   Marital status: Married    Spouse name: Not on file   Number of children: 2   Years of education: Not on file   Highest education level: Not on file  Occupational History   Occupation: Housewife  Tobacco Use   Smoking status: Former    Packs/day: 0.50    Years: 1.00    Pack years: 0.50    Types: Cigarettes, Cigars    Quit date: 09/03/1986    Years since quitting: 34.4   Smokeless tobacco: Never  Substance and Sexual Activity   Alcohol use: No   Drug use: No   Sexual activity: Not Currently  Other Topics Concern   Not on file  Social History Narrative   Not on file   Social Determinants of Health   Financial Resource Strain: High Risk   Difficulty of Paying Living Expenses: Hard  Food Insecurity: Food Insecurity Present   Worried About [place] in the Last Year: Sometimes true   Ran Out of Food in the Last Year: Sometimes true  Transportation Needs: No Transportation Needs   Lack of Transportation (Medical): No   Lack of Transportation (Non-Medical): No  Physical Activity: Not on file  Stress: Not on file  Social Connections: Not on file     Heart & Vascular Transition of Care Clinic follow-up: Scheduled for 02/21/21 at 3pm.  Confirmed patient has transportation.  Immediate social  needs: Disability and food stamps application needed. Patient has no health insurance.   CSW provided the patient with a CAFA application and encouraged them to bring it with them to their [place] clinic appointment. Patient doesn't have a PCP and nobody prescribing her medications. Ms. Eskin reported she has been to [facility] before but didn't have a good experience as she could never get in to be seen for needed appointments. Ms. Mikkelson reported being open to trying them again. Patient is scheduled for a hospital follow up at [facility], MSW, [place] Heart Failure Social Worker

## 2021-02-10 NOTE — Progress Notes (Signed)
Inpatient Diabetes Program Recommendations  AACE/ADA: New Consensus Statement on Inpatient Glycemic Control  Target Ranges:  Prepandial:   less than 140 mg/dL      Peak postprandial:   less than 180 mg/dL (1-2 hours)      Critically ill patients:  140 - 180 mg/dL   Results for Brenda [name], Brenda [name] (MRN [id number]) as of 02/10/2021 08:35  Ref. Range 02/09/2021 06:20 02/09/2021 11:19 02/09/2021 16:22 02/09/2021 21:25 02/09/2021 23:49 02/10/2021 01:15 02/10/2021 04:04 02/10/2021 06:02 02/10/2021 08:00  Glucose-Capillary Latest Ref Range: 70 - 99 mg/dL 75 252 (H) 320 (H) 416 (H) 411 (H) 384 (H) 323 (H) 260 (H) 197 (H)    Review of Glycemic Control  Diabetes history: DM2 Outpatient Diabetes medications: NPH 70 units BID, Regular 10 units BID (if CBG over 200 mg/dl) Current orders for Inpatient glycemic control: Lantus 40 units QHS, Novolog 0-20 units Q4H  Inpatient Diabetes Program Recommendations:    Insulin: Patient received Lantus 32 units last night at 23:50. Please consider increasing Lantus to 45 units QHS and adding Novolog 7 units TID with meals for meal coverage if patient eats at least 50% of meals.  Thanks, Barnie Alderman, RN, MSN, CDE Diabetes Coordinator Inpatient Diabetes Program [phone] (Team Pager from 8am to 5pm)

## 2021-02-10 NOTE — Evaluation (Signed)
Occupational Therapy Evaluation Patient Details Name: Brenda [name] MRN: [id number] DOB: 02/07/1961 Today's Date: 02/10/2021    History of Present Illness Pt is a 60 y/o female presenting on 6/8/ 2022 to ED with complaint of leg swelling. CXR shows no significant interval change since prior examination with  predominant findings of elevated right hemidiaphragm and mild  cardiomegaly. PMH significant for morbid obesity, OSA diabetes, PCOS, HTN, STEMI s/p LAD DES   Clinical Impression   PTA patient reports independent with ADLS, IADLs and driving. Admitted for above and presenting with problem list below, including increased edema, decreased activity tolerance and pain in B LES.  Patient currently requires up to max assist for LB ADLs, min assist for UB ADLs and supervision for transfers. She will benefit from continued OT services while admitted to optimize independence and return to PLOF with ADLs, mobility. Will follow acutely.     Follow Up Recommendations  No OT follow up;Supervision - Intermittent    Equipment Recommendations  None recommended by OT    Recommendations for Other Services       Precautions / Restrictions Precautions Precautions: Fall Restrictions Weight Bearing Restrictions: No      Mobility Bed Mobility               General bed mobility comments: pt seated in recliner    Transfers Overall transfer level: Needs assistance   Transfers: Sit to/from Stand Sit to Stand: Supervision         General transfer comment: for safety    Balance Overall balance assessment: Needs assistance Sitting-balance support: No upper extremity supported;Feet supported Sitting balance-Leahy Scale: Good     Standing balance support: No upper extremity supported;During functional activity Standing balance-Leahy Scale: Fair                             ADL either performed or assessed with clinical judgement   ADL Overall ADL's : Needs  assistance/impaired     Grooming: Set up;Sitting           Upper Body Dressing : Minimal assistance;Sitting   Lower Body Dressing: Maximal assistance;Sit to/from stand Lower Body Dressing Details (indicate cue type and reason): requires assist for socks, limited functioanl reach due to edema. supervision sit to stand Toilet Transfer: Supervision/safety           Functional mobility during ADLs: Supervision/safety General ADL Comments: pt limited by decreased activity tolerance, edema     Vision   Vision Assessment?: No apparent visual deficits     Perception     Praxis      Pertinent Vitals/Pain Pain Assessment: 0-10 Pain Score: 6  Pain Location: B LEs from edema Pain Descriptors / Indicators: Discomfort;Cramping Pain Intervention(s): Limited activity within patient's tolerance;Monitored during session;Repositioned     Hand Dominance Right   Extremity/Trunk Assessment Upper Extremity Assessment Upper Extremity Assessment: RUE deficits/detail;LUE deficits/detail RUE Deficits / Details: edema noted and decreased [place] LUE Deficits / Details: edema noted and decreased [facility]   Lower Extremity Assessment Lower Extremity Assessment: Defer to PT evaluation   Cervical / Trunk Assessment Cervical / Trunk Assessment: Normal   Communication Communication Communication: No difficulties   Cognition Arousal/Alertness: Awake/alert Behavior During Therapy: WFL for tasks assessed/performed Overall Cognitive Status: Within Functional Limits for tasks assessed  General Comments  VSS on RA    Exercises     Shoulder Instructions      Home Living Family/patient expects to be discharged to:: Private residence Living Arrangements: Children (36 y/o) Available Help at Discharge: Available 24 hours/day;Family Type of Home: House Home Access: Ramped entrance     Home Layout: One level     Bathroom Shower/Tub: Arts administrator: Standard     Home Equipment: Environmental consultant - 4 wheels;Shower seat;Grab bars - tub/shower;Grab bars - toilet          Prior Functioning/Environment Level of Independence: Independent with assistive device(s)        Comments: pt ambulates in the home without device, utilizes a rollator for community mobility. Pt requires frequent breaks at baseline due to DOE. Typically independent with ADLs, IADLs. Drives.        OT Problem List: Decreased strength;Decreased activity tolerance;Decreased range of motion;Impaired balance (sitting and/or standing);Decreased knowledge of use of DME or AE;Decreased knowledge of precautions;Cardiopulmonary status limiting activity;Obesity;Pain;Increased edema      OT Treatment/Interventions: Self-care/ADL training;Energy conservation;Therapeutic exercise;DME and/or AE instruction;Therapeutic activities;Patient/family education;Balance training    OT Goals(Current goals can be found in the care plan section) Acute Rehab OT Goals Patient Stated Goal: to lose weight and improve activity tolerance OT Goal Formulation: With patient Time For Goal Achievement: 02/24/21 Potential to Achieve Goals: Good  OT Frequency: Min 2X/week   Barriers to D/C:            Co-evaluation              AM-PAC OT "6 Clicks" Daily Activity     Outcome Measure Help from another person eating meals?: None Help from another person taking care of personal grooming?: A Little Help from another person toileting, which includes using toliet, bedpan, or urinal?: A Lot Help from another person bathing (including washing, rinsing, drying)?: A Lot Help from another person to put on and taking off regular upper body clothing?: A Little Help from another person to put on and taking off regular lower body clothing?: A Lot 6 Click Score: 16   End of Session Nurse Communication: Mobility status  Activity Tolerance: Patient tolerated treatment well Patient  left: in chair;with call bell/phone within reach  OT Visit Diagnosis: Other abnormalities of gait and mobility (R26.89);Muscle weakness (generalized) (M62.81);Pain Pain - Right/Left:  (bil) Pain - part of body: Leg;Ankle and joints of foot                Time: 0211-1735 OT Time Calculation (min): 14 min Charges:  OT General Charges $OT Visit: 1 Visit OT Evaluation $OT Eval Low Complexity: 1 Low  Jolaine Artist, OT Acute Rehabilitation Services Pager [phone] Office [phone]   [name] 02/10/2021, 9:22 AM

## 2021-02-11 LAB — BLOOD GAS, ARTERIAL
Acid-Base Excess: 9.1 mmol/L — ABNORMAL HIGH (ref 0.0–2.0)
Bicarbonate: 33 mmol/L — ABNORMAL HIGH (ref 20.0–28.0)
Drawn by: 30136
FIO2: 21
O2 Saturation: 90.1 %
Patient temperature: 37
pCO2 arterial: 44 mmHg (ref 32.0–48.0)
pH, Arterial: 7.488 — ABNORMAL HIGH (ref 7.350–7.450)
pO2, Arterial: 57.7 mmHg — ABNORMAL LOW (ref 83.0–108.0)

## 2021-02-11 LAB — CBC
HCT: 32.6 % — ABNORMAL LOW (ref 36.0–46.0)
Hemoglobin: 9 g/dL — ABNORMAL LOW (ref 12.0–15.0)
MCH: 20.1 pg — ABNORMAL LOW (ref 26.0–34.0)
MCHC: 27.6 g/dL — ABNORMAL LOW (ref 30.0–36.0)
MCV: 72.9 fL — ABNORMAL LOW (ref 80.0–100.0)
Platelets: 294 10*3/uL (ref 150–400)
RBC: 4.47 MIL/uL (ref 3.87–5.11)
RDW: 21.2 % — ABNORMAL HIGH (ref 11.5–15.5)
WBC: 9 10*3/uL (ref 4.0–10.5)
nRBC: 0.2 % (ref 0.0–0.2)

## 2021-02-11 LAB — T4, FREE: Free T4: 1.08 ng/dL (ref 0.61–1.12)

## 2021-02-11 LAB — COMPREHENSIVE METABOLIC PANEL
ALT: 16 U/L (ref 0–44)
AST: 32 U/L (ref 15–41)
Albumin: 2.8 g/dL — ABNORMAL LOW (ref 3.5–5.0)
Alkaline Phosphatase: 105 U/L (ref 38–126)
Anion gap: 8 (ref 5–15)
BUN: 10 mg/dL (ref 6–20)
CO2: 35 mmol/L — ABNORMAL HIGH (ref 22–32)
Calcium: 8.8 mg/dL — ABNORMAL LOW (ref 8.9–10.3)
Chloride: 94 mmol/L — ABNORMAL LOW (ref 98–111)
Creatinine, Ser: 0.83 mg/dL (ref 0.44–1.00)
GFR, Estimated: >60 mL/min (ref >60)
Glucose, Bld: 152 mg/dL — ABNORMAL HIGH (ref 70–99)
Potassium: 4.3 mmol/L (ref 3.5–5.1)
Sodium: 137 mmol/L (ref 135–145)
Total Bilirubin: 0.8 mg/dL (ref 0.3–1.2)
Total Protein: 6.7 g/dL (ref 6.5–8.1)

## 2021-02-11 LAB — GLUCOSE, CAPILLARY
Glucose-Capillary: 113 mg/dL — ABNORMAL HIGH (ref 70–99)
Glucose-Capillary: 145 mg/dL — ABNORMAL HIGH (ref 70–99)
Glucose-Capillary: 147 mg/dL — ABNORMAL HIGH (ref 70–99)
Glucose-Capillary: 153 mg/dL — ABNORMAL HIGH (ref 70–99)
Glucose-Capillary: 170 mg/dL — ABNORMAL HIGH (ref 70–99)
Glucose-Capillary: 191 mg/dL — ABNORMAL HIGH (ref 70–99)
Glucose-Capillary: 203 mg/dL — ABNORMAL HIGH (ref 70–99)

## 2021-02-11 MED ORDER — SENNOSIDES-DOCUSATE SODIUM 8.6-50 MG PO TABS
1.0000 | ORAL_TABLET | Freq: Two times a day (BID) | ORAL | Status: DC | PRN
  Administered 2021-02-12: 1 via ORAL
  Filled 2021-02-11: qty 1

## 2021-02-11 MED ORDER — HYDROMORPHONE HCL 1 MG/ML IJ SOLN
0.5000 mg | Freq: Once | INTRAMUSCULAR | Status: AC
  Administered 2021-02-12: 0.5 mg via INTRAVENOUS
  Filled 2021-02-11: qty 0.5

## 2021-02-11 NOTE — Progress Notes (Addendum)
Subjective:  Overnight Events: No overnight events  No new concerns on exam this morning.  Patient continues to feel her abdomen has a lot of fluid on it. She was able to wear the CPAP without difficulty overnight. Discussed getting an ABG today,patient remembers having this done one time before and we discussed providing pain medication to help her tolerate this test.  Objective:  Vital signs in last 24 hours: Vitals:   02/10/21 2220 02/11/21 0047 02/11/21 0531 02/11/21 0644  BP:  (!) 107/46 (!) 133/51   Pulse:  76 73   Resp:  18    Temp:  97.9 F (36.6 C) 98.2 F (36.8 C)   TempSrc:   Oral   SpO2: 92% 90% 97%   Weight:    (!) 156.8 kg  Height:       Supplemental O2: Nasal Cannula SpO2: 97 % O2 Flow Rate (L/min): 3 L/min   Physical Exam:   General: Morbidly obese woman, hirsutism, NAD HEENT: Unable to assess JVD secondary to body habitus, moist mucous membranes, anicteric sclera Cardiovascular: Normal rate, regular rhythm.  No murmurs, rubs, or gallops. 1 + pitting edema in lower extremities Pulmonary : Distant breath sounds, no rales or wheezing Abdominal: Obese, active bowel sounds Musculoskeletal: no deformity, injury ,or tenderness in extremities   Filed Weights   02/09/21 0253 02/10/21 0357 02/11/21 0644  Weight: (!) 160.4 kg (!) 160.5 kg (!) 156.8 kg     Intake/Output Summary (Last 24 hours) at 02/11/2021 7096 Last data filed at 02/11/2021 0129 Gross per 24 hour  Intake 994 ml  Output 4400 ml  Net -3406 ml   Net IO Since Admission: -6,895.34 mL [02/11/21 0652]    Assessment/Plan:   Principal Problem:   Right heart failure ([place]) Active Problems:   Acute on chronic diastolic (congestive) heart failure ([place])   Hypoglycemia   Patient Summary: Hospital day 3 for Brenda [name] a 60 y.o. person living with obesity, OSA, diabetes, PCOS, HTN, STEMI status post LAD DES, admitted with new right heart failure.   #Acute right heart failure #  Metabolic alkalosis  Echo on admission showed new moderately reduced right ventricular systolic function, compared to echo 1 year ago.  Presumed etiology is untreated OSA.  Diuresing with IV Lasix 40 mg twice daily.  Patient does not have a known dry weight, still appears warm overloaded today.  She does have a contraction alkalosis on labs for the last 2 days. Creatine is stable.  She is net -7 L, discordant but 5 kg down on admission.  CPAP nightly and tolerating this well. -Continue IV furosemide 40 twice daily -Strict ins and outs - Daily weights -Fluid restriction - Maintain O2 sat greater than 88% --Maintain Mg > 2, K >4  #OSA #OHS? She does not currently have a CPAP machine.  Reports that she was diagnosed with OSA 20 years ago.  BMI 65.  Intermittently has needed some oxygen for comfort.  On room air today with nl oxygen saturation.  -ABG this afternoon, one-time 0.5 Dilaudid to help with pain of procedure. If CO2 elevated patient would  Qualify for Trelegy.   #CAD #H/o STEMI with LAD DES  Patient had stent to LAD in 2010.  Also had STEMI with DES 2021.  She has been on DAPT. Has not seen cardiology recently and was recommended to stay on DAPT for atleast 1 year from last stent. Recommendations for lifelong Efficient in 2010. - Will continue DAPT - Recommend f/u  with cardiology at discharge.  #T2DM Hemoglobin A1c 8.9.  A.m. blood glucose 113 Continue Lantus 45 units Continue SSI - Resistant  Diet:  Low sodium VTE: Enoxaparin Code: Full   Dispo: Anticipated discharge to Home in 2 days pending continued diuresis and medical workup.   [name], MD PGY2 Internal Medicine Pager: [phone] Please contact the on call pager after 5 pm and on weekends at [phone].

## 2021-02-11 NOTE — Progress Notes (Signed)
Discussed with patient the plan to move rooms to accommodate the bariatric bed. Educated patient on the reason for order and need for the bariatric bed. Patient verbalized understanding and expressed that she prefers to stay in the current room with the regular hospital bed at this time. Patient states that she will notify nursing staff if she desires to use the bariatric bed at any time during admission. Will continue to monitor.

## 2021-02-11 NOTE — Progress Notes (Signed)
Orthopedic Tech Progress Note Patient Details:  Brenda [name] 1960/11/25 [id number]  Ortho Devices Type of Ortho Device: Louretta Parma boot Ortho Device/Splint Location: Bilateral Ortho Device/Splint Interventions: Application, Ordered   Post Interventions Patient Tolerated: Well  [name] [name] 02/11/2021, 5:16 PM

## 2021-02-11 NOTE — Progress Notes (Signed)
Occupational Therapy Treatment Patient Details Name: Brenda [name] MRN: [id number] DOB: 1961-05-17 Today's Date: 02/11/2021    History of present illness Pt is a 60 y/o female presenting on 6/8/ 2022 to ED with complaint of leg swelling. CXR shows no significant interval change since prior examination with  predominant findings of elevated right hemidiaphragm and mild  cardiomegaly. PMH significant for morbid obesity, OSA diabetes, PCOS, HTN, STEMI s/p LAD DES   OT comments  Pt seated EOB, agreeable to OT.  Patient educated on and issued long handled sponge, reacher and sock aide.  Return demonstrated use of LB ADLs with up to min assist.  Will benefit from toileting aide education and further energy conservation training next session.  Will follow.    Follow Up Recommendations  No OT follow up;Supervision - Intermittent    Equipment Recommendations  None recommended by OT    Recommendations for Other Services      Precautions / Restrictions Precautions Precautions: Fall Restrictions Weight Bearing Restrictions: No       Mobility Bed Mobility               General bed mobility comments: EOB upon entry    Transfers Overall transfer level: Needs assistance   Transfers: Sit to/from Stand Sit to Stand: Supervision         General transfer comment: for safety    Balance Overall balance assessment: Needs assistance Sitting-balance support: No upper extremity supported;Feet supported Sitting balance-Leahy Scale: Good     Standing balance support: No upper extremity supported;During functional activity Standing balance-Leahy Scale: Fair                             ADL either performed or assessed with clinical judgement   ADL Overall ADL's : Needs assistance/impaired             Lower Body Bathing: Supervison/ safety;With adaptive equipment;Sit to/from stand Lower Body Bathing Details (indicate cue type and reason): reviewed use of long  handled sponge for LB bathing     Lower Body Dressing: Minimal assistance;Sit to/from stand Lower Body Dressing Details (indicate cue type and reason): educated on AE for LB dressing (reacher, sock aide) with return demonstrating min assist at times; supervision sit to stand Toilet Transfer: Supervision/safety           Functional mobility during ADLs: Supervision/safety General ADL Comments: pt limited by decreased activity tolerance, edema     Vision       Perception     Praxis      Cognition Arousal/Alertness: Awake/alert Behavior During Therapy: WFL for tasks assessed/performed Overall Cognitive Status: Within Functional Limits for tasks assessed                                          Exercises     Shoulder Instructions       General Comments      Pertinent Vitals/ Pain       Pain Assessment: Faces Faces Pain Scale: Hurts little more Pain Location: B LEs from edema Pain Descriptors / Indicators: Discomfort Pain Intervention(s): Limited activity within patient's tolerance;Monitored during session;Repositioned  Home Living  Prior Functioning/Environment              Frequency  Min 2X/week        Progress Toward Goals  OT Goals(current goals can now be found in the care plan section)  Progress towards OT goals: Progressing toward goals  Acute Rehab OT Goals Patient Stated Goal: to lose weight and improve activity tolerance OT Goal Formulation: With patient  Plan Discharge plan remains appropriate;Frequency remains appropriate    Co-evaluation                 AM-PAC OT "6 Clicks" Daily Activity     Outcome Measure   Help from another person eating meals?: None Help from another person taking care of personal grooming?: A Little Help from another person toileting, which includes using toliet, bedpan, or urinal?: A Lot Help from another person bathing  (including washing, rinsing, drying)?: A Little Help from another person to put on and taking off regular upper body clothing?: A Little Help from another person to put on and taking off regular lower body clothing?: A Little 6 Click Score: 18    End of Session    OT Visit Diagnosis: Other abnormalities of gait and mobility (R26.89);Muscle weakness (generalized) (M62.81);Pain Pain - Right/Left:  (bil) Pain - part of body: Leg;Ankle and joints of foot   Activity Tolerance Patient tolerated treatment well   Patient Left with call bell/phone within reach;Other (comment) (in bathroom)   Nurse Communication Mobility status        Time: 3094-0768 OT Time Calculation (min): 13 min  Charges: OT General Charges $OT Visit: 1 Visit OT Treatments $Self Care/Home Management : 8-22 mins  Jolaine Artist, OT [place] Pager [phone] Office [phone]    [name] 02/11/2021, 10:45 AM

## 2021-02-11 NOTE — Progress Notes (Signed)
Pt not wearing CPAP tonight due to ABG ordered for AM to assess for home Bipap.

## 2021-02-12 DIAGNOSIS — I50813 Acute on chronic right heart failure: Secondary | ICD-10-CM

## 2021-02-12 LAB — BLOOD GAS, ARTERIAL
Acid-Base Excess: 9.9 mmol/L — ABNORMAL HIGH (ref 0.0–2.0)
Bicarbonate: 35.3 mmol/L — ABNORMAL HIGH (ref 20.0–28.0)
Drawn by: 28340
FIO2: 24
O2 Saturation: 81.5 %
Patient temperature: 37
pCO2 arterial: 62.5 mmHg — ABNORMAL HIGH (ref 32.0–48.0)
pH, Arterial: 7.371 (ref 7.350–7.450)
pO2, Arterial: 52 mmHg — ABNORMAL LOW (ref 83.0–108.0)

## 2021-02-12 LAB — COMPREHENSIVE METABOLIC PANEL
ALT: 19 U/L (ref 0–44)
AST: 25 U/L (ref 15–41)
Albumin: 2.8 g/dL — ABNORMAL LOW (ref 3.5–5.0)
Alkaline Phosphatase: 98 U/L (ref 38–126)
Anion gap: 10 (ref 5–15)
BUN: 10 mg/dL (ref 6–20)
CO2: 33 mmol/L — ABNORMAL HIGH (ref 22–32)
Calcium: 8.5 mg/dL — ABNORMAL LOW (ref 8.9–10.3)
Chloride: 93 mmol/L — ABNORMAL LOW (ref 98–111)
Creatinine, Ser: 0.81 mg/dL (ref 0.44–1.00)
GFR, Estimated: >60 mL/min (ref >60)
Glucose, Bld: 100 mg/dL — ABNORMAL HIGH (ref 70–99)
Potassium: 3.6 mmol/L (ref 3.5–5.1)
Sodium: 136 mmol/L (ref 135–145)
Total Bilirubin: 1 mg/dL (ref 0.3–1.2)
Total Protein: 6.6 g/dL (ref 6.5–8.1)

## 2021-02-12 LAB — GLUCOSE, CAPILLARY
Glucose-Capillary: 101 mg/dL — ABNORMAL HIGH (ref 70–99)
Glucose-Capillary: 148 mg/dL — ABNORMAL HIGH (ref 70–99)
Glucose-Capillary: 271 mg/dL — ABNORMAL HIGH (ref 70–99)
Glucose-Capillary: 231 mg/dL — ABNORMAL HIGH (ref 70–99)
Glucose-Capillary: 264 mg/dL — ABNORMAL HIGH (ref 70–99)
Glucose-Capillary: 228 mg/dL — ABNORMAL HIGH (ref 70–99)

## 2021-02-12 LAB — RENAL FUNCTION PANEL
Albumin: 2.8 g/dL — ABNORMAL LOW (ref 3.5–5.0)
Anion gap: 10 (ref 5–15)
BUN: 11 mg/dL (ref 6–20)
CO2: 31 mmol/L (ref 22–32)
Calcium: 8.5 mg/dL — ABNORMAL LOW (ref 8.9–10.3)
Chloride: 91 mmol/L — ABNORMAL LOW (ref 98–111)
Creatinine, Ser: 1.04 mg/dL — ABNORMAL HIGH (ref 0.44–1.00)
GFR, Estimated: >60 mL/min (ref >60)
Glucose, Bld: 272 mg/dL — ABNORMAL HIGH (ref 70–99)
Phosphorus: 3.6 mg/dL (ref 2.5–4.6)
Potassium: 4.1 mmol/L (ref 3.5–5.1)
Sodium: 132 mmol/L — ABNORMAL LOW (ref 135–145)

## 2021-02-12 LAB — CBC
HCT: 33.1 % — ABNORMAL LOW (ref 36.0–46.0)
Hemoglobin: 9.2 g/dL — ABNORMAL LOW (ref 12.0–15.0)
MCH: 20.4 pg — ABNORMAL LOW (ref 26.0–34.0)
MCHC: 27.8 g/dL — ABNORMAL LOW (ref 30.0–36.0)
MCV: 73.6 fL — ABNORMAL LOW (ref 80.0–100.0)
Platelets: 278 10*3/uL (ref 150–400)
RBC: 4.5 MIL/uL (ref 3.87–5.11)
RDW: 21.8 % — ABNORMAL HIGH (ref 11.5–15.5)
WBC: 10 10*3/uL (ref 4.0–10.5)
nRBC: 0.2 % (ref 0.0–0.2)

## 2021-02-12 LAB — MAGNESIUM
Magnesium: 1.6 mg/dL — ABNORMAL LOW (ref 1.7–2.4)
Magnesium: 2.1 mg/dL (ref 1.7–2.4)

## 2021-02-12 LAB — TSH: TSH: 1.837 u[IU]/mL (ref 0.350–4.500)

## 2021-02-12 MED ORDER — POTASSIUM CHLORIDE CRYS ER 20 MEQ PO TBCR
40.0000 meq | EXTENDED_RELEASE_TABLET | Freq: Once | ORAL | Status: AC
  Administered 2021-02-12: 40 meq via ORAL
  Filled 2021-02-12: qty 2

## 2021-02-12 MED ORDER — MAGNESIUM SULFATE 4 GM/100ML IV SOLN
4.0000 g | Freq: Once | INTRAVENOUS | Status: AC
  Administered 2021-02-12: 4 g via INTRAVENOUS
  Filled 2021-02-12: qty 100

## 2021-02-12 MED ORDER — SODIUM CHLORIDE 0.9 % IV SOLN
510.0000 mg | Freq: Once | INTRAVENOUS | Status: AC
  Administered 2021-02-12: 510 mg via INTRAVENOUS
  Filled 2021-02-12: qty 17

## 2021-02-12 NOTE — Hospital Course (Addendum)
At home -Insulin regular 30 units qc Insulin NPH 50 units BID  TSH 1.837   Patient reports her chest pain felt different from her past heart attack. Thinks it is mostly MSK and better today. Cough has gotten worse yesterday and heavy this morning. Coughing up whitish sputum. Leg swelling feels better, not as tight. Uses her CPAP at night. Reports some tingling in hands.

## 2021-02-12 NOTE — Progress Notes (Addendum)
IMTS Update:   I was paged by RN for patient having CP. At bedside, Brenda [name] says she continues to have intermittent substernal CP that lasts ~2-3 minutes at a time, associated with exertion. She also notes bilateral hand tingling and cramps in her bilateral lower extremities that she attributes to Lasix. Her EKG did not show any changes concerning for ischemia and she says her current pain feels more muscular in nature and unlike the type of pain she had with her previous MI's. She says that pain had been severe and sharp with radiation into her back. Current pain is dull and is not pleuritic nor necessarily associated with movement. She says she's not needed to take NTG at home for anginal-type pain since her last MI. Still feels significantly volume overloaded and telemetry monitoring does show intermittent episodes of sinus bradycardia vs. Sinus node arrhythmia (? Wenkebach) that occurred transiently on multiple occasions this afternoon, which patient and RN attributed to her being off CPAP for the time and last night.   UPDATE:  I was paged by RN ~11:30pm for low pressures 66/28 in left arm and 86/52 in left leg. She reportedly has had lower pressures in her left arm, although we are unable to measure from her right arm due to her IV and she has UNNA boots on her lower extremities. Per RN, most accurate site for assessment in left wrist. He says she'd been lethargic at the time saturating 82-88% despite wearing BIPAP. I believe her pressures were falsely low given her bilateral radial pulses were 2+ and all extremities were warm. RT were called and her BIPAP was leaking. With repositioning, saturations increased to 100%. Patient was easily arousable, answering questions appropriately when I spoke with her in the room. She said her chest pain and breathing felt much better after her mask was adjusted and denied any complaints saying she felt well. Of note, telemetry monitoring appears to show very  frequent PAC's with intermittent sinus bradycardia. Anticipate symptoms earlier (and possibly telemetry changes) are due to increased demand on her heart due to underlying pulmonary disease / morbid obesity; however, will repeat EKG and still waiting on STAT troponin to be drawn. Phlebotomy notified.   PE:  Vitals stable  General: Sitting up, in no acute distress.  Respiratory: Saturating well with [place].  Cardiovascular: 2/6 right upper sternal border. Other heart sounds difficult to appreciate although no other clear murmurs, rubs or gallops. RRR.  LE: Covered by Louretta Parma boots bilaterally. Psych: Pleasant and cooperative.   Assessment / Plan:   # Typical CP # Acute Hypoxic Respiratory Failure  # Sinus Bradycardia, Transient  # Irregular Rhythm likely 2/2 Frequent PAC's  EKG reassuring without signs of acute ischemia; however, she is high risk for this. History and exam concerning for typical angina (vs. Musculoskeletal origin of pain). STAT RFP + Magnesium unremarkable aside from slight bump in creatinine (on frequent diuresis).  - Check troponin  - repeat ~11:45pm showed what appeared to be frequent PAC's (also on telemetry monitoring) as well as known RBBB with persistent T wave inversions in V1-V3 although no acute ST segment changes to suggest acute ischemia  - Encouraged patient / RN to reach out if CP worsens or remains persistent  - BIPAP overnight  - Vital signs q2 hours - Continuous telemetry and pulse oximetry   UPDATE:  - Troponin negative without changes in EKG aside from sinus arrhythmia. Likely all symptoms / findings are related to hypoxic, hypercarbic respiratory disease. Continue plan  otherwise as above.   Jeralyn Bennett, MD 02/12/2021, 9:42 PM Pager: [phone]

## 2021-02-12 NOTE — Progress Notes (Addendum)
Subjective:  Overnight Events: Patient did no wear CPAP in order to obtain AM ABG.  States she is breathing a little hard this morning. Swelling feels better and legs are not too tight. Does not feel like she is back to where it was before her need for admission. Reports needing a stool softener, and told this has been placed PRN. Last bowel movement yesterday. No other acute concerns or questions.   Objective:  Vital signs in last 24 hours: Vitals:   02/11/21 2142 02/11/21 2327 02/12/21 0157 02/12/21 0337  BP: (!) 108/44 122/65  (!) 134/57  Pulse: 79 94  93  Resp:  (!) 24  (!) 24  Temp:  97.7 F (36.5 C)  97.6 F (36.4 C)  TempSrc:  Oral  Oral  SpO2:  94%  91%  Weight:   (!) 154.9 kg   Height:       Supplemental O2: Nasal Cannula SpO2: 91 % O2 Flow Rate (L/min): 1 L/min   Physical Exam:   General: Morbidly obese woman, hirsutism, NAD HEENT: Unable to assess JVD secondary to body habitus, moist mucous membranes, anicteric sclera Cardiovascular: Normal rate, regular rhythm.  No murmurs, rubs, or gallops. 1 + pitting edema in lower extremities. Unna boots in place.  Pulmonary : Distant breath sounds, no rales or wheezing Abdominal: Obese, active bowel sounds Musculoskeletal: no deformity, injury ,or tenderness in extremities   Filed Weights   02/10/21 0357 02/11/21 0644 02/12/21 0157  Weight: (!) 160.5 kg (!) 156.8 kg (!) 154.9 kg     Intake/Output Summary (Last 24 hours) at 02/12/2021 0272 Last data filed at 02/11/2021 2155 Gross per 24 hour  Intake 734 ml  Output 1750 ml  Net -1016 ml    Net IO Since Admission: -7,911.34 mL [02/12/21 0609]     Component Value Date/Time   PHART 7.371 02/12/2021 0454   PCO2ART 62.5 (H) 02/12/2021 0454   PO2ART 52.0 (L) 02/12/2021 0454   HCO3 35.3 (H) 02/12/2021 0454   TCO2 25 01/17/2011 1525   O2SAT 81.5 02/12/2021 0454    Assessment/Plan:   Principal Problem:   Right heart failure ([facility]) Active Problems:   Acute on  chronic diastolic (congestive) heart failure ([place])   Hypoglycemia   Patient Summary: Hospital day 4 for Brenda [name] a 60 y.o. person living with obesity, OSA, diabetes, PCOS, HTN, STEMI status post LAD DES, admitted with new right heart failure.   #Acute right heart failure # Mild Metabolic alkalosis  Echo on admission showed new moderately reduced right ventricular systolic function, compared to echo 1 year ago.  Presumed etiology is untreated OSA.  Diuresing with IV Lasix 40 mg twice daily.  Patient does not have a known dry weight, still appears volume overloaded today. She has unna boots in place. Creatine is stable. Mild metabolic alkalosis, likely from diuretics. She is net -7.6 L on admission, 2.7 L in last 24 hrs. Weight down 6.9 kg. Mg 1.6, K 3.6. -Continue IV furosemide 40 twice daily -Strict ins and outs - Daily weights -Fluid restriction - Maintain O2 sat greater than 88% -- Replete magnesium and potassium to goal, Mg > 2, K >4  #History of OSA #Compensated respiratory acidosis Patient has a compensated respiratory acidosis on blood gas.  Specifically pCO2 62.5.  BMI of 64. Will need to arrange home BIPAP on Monday.   #CAD #H/o STEMI with LAD DES  Patient had stent to LAD in 2010.  Also had STEMI with DES 2021.  She has been on DAPT. Has not seen cardiology recently and was recommended to stay on DAPT for atleast 1 year from last stent. Recommendations for lifelong Prasugrel in 2010. - Will continue DAPT - Recommend f/u with cardiology at discharge.  #T2DM Hemoglobin A1c 8.9.  A.m. blood glucose 100, FS glucose range 101 to 203 yesterday.  Continue Lantus 45 units Continue SSI - Resistant  #Iron deficiency anemia Microcytic anemia workup on admission suggest iron deficiency anemia. Reports hx of IDA but could not afford medications.  Patient given 1 dose of Feraheme 3 days ago.  Ordered second dose of Feraheme for today. Hemoglobin stable at 9. Will need follow  up by PCP and colon cancer screening as outpatient.   Diet:  Low sodium VTE: Enoxaparin Code: Full   Dispo: Anticipated discharge to Home in 1 day pending continued diuresis and medical workup.   [name], MD PGY2 Internal Medicine Pager: [phone] Please contact the on call pager after 5 pm and on weekends at [phone].

## 2021-02-13 ENCOUNTER — Other Ambulatory Visit (HOSPITAL_COMMUNITY): Payer: Self-pay

## 2021-02-13 LAB — BASIC METABOLIC PANEL
Anion gap: 9 (ref 5–15)
BUN: 12 mg/dL (ref 6–20)
CO2: 32 mmol/L (ref 22–32)
Calcium: 8.4 mg/dL — ABNORMAL LOW (ref 8.9–10.3)
Chloride: 92 mmol/L — ABNORMAL LOW (ref 98–111)
Creatinine, Ser: 0.97 mg/dL (ref 0.44–1.00)
GFR, Estimated: >60 mL/min (ref >60)
Glucose, Bld: 229 mg/dL — ABNORMAL HIGH (ref 70–99)
Potassium: 3.9 mmol/L (ref 3.5–5.1)
Sodium: 133 mmol/L — ABNORMAL LOW (ref 135–145)

## 2021-02-13 LAB — GLUCOSE, CAPILLARY
Glucose-Capillary: 121 mg/dL — ABNORMAL HIGH (ref 70–99)
Glucose-Capillary: 153 mg/dL — ABNORMAL HIGH (ref 70–99)
Glucose-Capillary: 189 mg/dL — ABNORMAL HIGH (ref 70–99)
Glucose-Capillary: 214 mg/dL — ABNORMAL HIGH (ref 70–99)
Glucose-Capillary: 288 mg/dL — ABNORMAL HIGH (ref 70–99)

## 2021-02-13 LAB — URINE CULTURE: Culture: >=100000 — AB

## 2021-02-13 LAB — CBC
HCT: 31.7 % — ABNORMAL LOW (ref 36.0–46.0)
Hemoglobin: 8.8 g/dL — ABNORMAL LOW (ref 12.0–15.0)
MCH: 20.7 pg — ABNORMAL LOW (ref 26.0–34.0)
MCHC: 27.8 g/dL — ABNORMAL LOW (ref 30.0–36.0)
MCV: 74.4 fL — ABNORMAL LOW (ref 80.0–100.0)
Platelets: 234 10*3/uL (ref 150–400)
RBC: 4.26 MIL/uL (ref 3.87–5.11)
RDW: 22.1 % — ABNORMAL HIGH (ref 11.5–15.5)
WBC: 10 10*3/uL (ref 4.0–10.5)
nRBC: 0.2 % (ref 0.0–0.2)

## 2021-02-13 LAB — RETICULOCYTES
Immature Retic Fract: 48.9 % — ABNORMAL HIGH (ref 2.3–15.9)
RBC.: 4.28 MIL/uL (ref 3.87–5.11)
Retic Count, Absolute: 143.8 10*3/uL (ref 19.0–186.0)
Retic Ct Pct: 3.4 % — ABNORMAL HIGH (ref 0.4–3.1)

## 2021-02-13 LAB — MAGNESIUM: Magnesium: 2 mg/dL (ref 1.7–2.4)

## 2021-02-13 LAB — TROPONIN I (HIGH SENSITIVITY): Troponin I (High Sensitivity): 7 ng/L (ref <18)

## 2021-02-13 MED ORDER — FUROSEMIDE 40 MG PO TABS
ORAL_TABLET | ORAL | Status: AC
  Filled 2021-02-13: qty 1

## 2021-02-13 MED ORDER — FUROSEMIDE 40 MG PO TABS
40.0000 mg | ORAL_TABLET | Freq: Every day | ORAL | Status: DC
  Administered 2021-02-14: 40 mg via ORAL
  Filled 2021-02-13: qty 1

## 2021-02-13 MED ORDER — FUROSEMIDE 40 MG PO TABS
40.0000 mg | ORAL_TABLET | Freq: Two times a day (BID) | ORAL | Status: DC
  Administered 2021-02-13: 40 mg via ORAL

## 2021-02-13 MED ORDER — INSULIN ASPART 100 UNIT/ML IJ SOLN
7.0000 [IU] | Freq: Three times a day (TID) | INTRAMUSCULAR | Status: DC
  Administered 2021-02-13 – 2021-02-14 (×4): 7 [IU] via SUBCUTANEOUS

## 2021-02-13 NOTE — Progress Notes (Addendum)
Subjective:  Overnight Events: Pt had episode of CP associated with Exertion. Also noted tingling and cramps in b/l lower extremities.  EKG EKG did not show any changes concerning for ischemia. She stated that pain is muscular in nature and unlike the type of pain she had with her previous MI's. Thought to be due to not wearing CPAP.  Resident was paged again for low pressures left arm 66/28 and 86/52 mmHg right arm. Stat Trops negative without changes in EKG aside from frequent PAC's (also on telemetry monitoring) as well as known RBBB with persistent T wave inversions in V1-V3 although no acute ST segment changes to suggest acute ischemia. Likely all symptoms / findings are related to hypoxic, hypercarbic respiratory disease.  Pt seen and examined this morning. Patient reports her chest pain felt different from her past heart attack. Thinks it is mostly MSK and better today. Cough has gotten worse yesterday and heavy this morning. Coughing up whitish sputum. Leg swelling feels better, not as tight. Uses her CPAP at night. Reports some tingling in hands. Explained that we are holding her diuretics because of low BP but we'll start again and keep monitoring BP.  Objective:  Vital signs in last 24 hours: Vitals:   02/13/21 0400 02/13/21 0600 02/13/21 0657 02/13/21 0820  BP: (!) 120/50 (!) 120/52  (!) 101/58  Pulse: 88 68  85  Resp: 20   19  Temp: 98.3 F (36.8 C)   97.9 F (36.6 C)  TempSrc: Oral   Oral  SpO2: 97% 100%  94%  Weight:   (!) 153 kg   Height:       Supplemental O2: Nasal Cannula SpO2: 94 % O2 Flow Rate (L/min): 6 L/min  Usually on 2 l/min  Physical Exam:   General: Morbidly obese woman sitting on chair comfortably, not in acute distress. hirsutism on face, Nasal cannula in place with 2 L /min. HEENT: Unable to access JVD secondary to body habitus, moist mucous membranes, anicteric sclera.  Cardiovascular: Normal rate and rhythm.  No murmurs, rubs, gallops.    Extremities :1+ pitting edema in lower extremities is wraps in place. Pulmonary : Distant breath sounds, no Rales, wheezing, rhonchi.  Musculoskeletal: No deformity, injury, tenderness in extremities.  Psychiatric-mood and affect normal  Filed Weights   02/11/21 0644 02/12/21 0157 02/13/21 0657  Weight: (!) 156.8 kg (!) 154.9 kg (!) 153 kg     Intake/Output Summary (Last 24 hours) at 02/13/2021 0833 Last data filed at 02/13/2021 5809 Gross per 24 hour  Intake 1683 ml  Output 2600 ml  Net -917 ml   Net IO Since Admission: -8,588.34 mL [02/13/21 0833]  CBC Latest Ref Rng & Units 02/13/2021 02/12/2021 02/11/2021  WBC 4.0 - 10.5 K/uL 10.0 10.0 9.0  Hemoglobin 12.0 - 15.0 g/dL 8.8(L) 9.2(L) 9.0(L)  Hematocrit 36.0 - 46.0 % 31.7(L) 33.1(L) 32.6(L)  Platelets 150 - 400 K/uL 234 278 294    CMP Latest Ref Rng & Units 02/13/2021 02/12/2021 02/12/2021  Glucose 70 - 99 mg/dL 229(H) 272(H) 100(H)  BUN 6 - 20 mg/dL 12 11 10   Creatinine 0.44 - 1.00 mg/dL 0.97 1.04(H) 0.81  Sodium 135 - 145 mmol/L 133(L) 132(L) 136  Potassium 3.5 - 5.1 mmol/L 3.9 4.1 3.6  Chloride 98 - 111 mmol/L 92(L) 91(L) 93(L)  CO2 22 - 32 mmol/L 32 31 33(H)  Calcium 8.9 - 10.3 mg/dL 8.4(L) 8.5(L) 8.5(L)  Total Protein 6.5 - 8.1 g/dL - - 6.6  Total Bilirubin  0.3 - 1.2 mg/dL - - 1.0  Alkaline Phos 38 - 126 U/L - - 98  AST 15 - 41 U/L - - 25  ALT 0 - 44 U/L - - 19       Component Value Date/Time   PHART 7.371 02/12/2021 0454   PCO2ART 62.5 (H) 02/12/2021 0454   PO2ART 52.0 (L) 02/12/2021 0454   HCO3 35.3 (H) 02/12/2021 0454   TCO2 25 01/17/2011 1525   O2SAT 81.5 02/12/2021 0454    Assessment/Plan:   Principal Problem:   Right heart failure ([facility]) Active Problems:   Acute on chronic diastolic (congestive) heart failure ([place])   Hypoglycemia   Patient Summary: Hospital day 5 for Brenda [name] a 60 y.o. person living with obesity, OSA, diabetes, PCOS, HTN, STEMI status post LAD DES, admitted with new right  heart failure.  #Acute right heart failure Patient still has edema in bilateral legs.  Complains of cough with white sputum.  Lungs clear on auscultation.  Satting well at 2 L/min. Echo on admission showed new moderately reduced right ventricular systolic function, compared to echo 1 year ago.  Presumed etiology is untreated OSA.  Have been diuresing with IV Lasix 40 mg thrice daily.  Patient does not have a known dry weight, still appears volume overloaded today. She has unna boots in place. Creatinine is stable. She is net -8.8 L since admission, -1157 mL in last 24 hrs. Weight down 19.4 pounds. Mg 2.0 K 3.9.  - Reduce IV furosemide to 40 mg twice daily. - Strict I's and O's - Daily weights - Fluid restriction - Maintain oxygen saturation more than 88% -- Replete magnesium and potassium to goal, Mg > 2, K >4 --Patient will follow up at IM clinic at discharge. #History of OSA #Respiratory acidosis with compensatory metabolic alkalosis.  Patient has respiratory acidosis. ABG on 6/12 revealed pCO2 62.5.  Patient has BMI of 64.5.  -Education officer, museum and case manager helping with [employer] approval.   #CAD #H/o STEMI with LAD DES  Patient had stent to LAD in 2010.  Also had STEMI with DES 2021.  She has been on DAPT. Has not seen cardiology recently and was recommended to stay on DAPT for atleast 1 year from last stent. Recommendations for lifelong Prasugrel in 2010. - Will continue DAPT - Recommend f/u with cardiology at discharge.  #T2DM Hemoglobin A1c 8.9.  A.m.CBG 153. BG range 148 to 271 yesterday.  Continue Lantus 45 units -Also on 5 units with meals.  Continue SSI - Resistant  #Iron deficiency anemia Microcytic anemia workup on admission suggest iron deficiency anemia. Reports hx of IDA but could not afford medications.  Patient given 1 dose of Feraheme 4 days ago and second dose of Feraheme yesterday. Hemoglobin stable at 8.8.  -Will need follow up by PCP and colon cancer screening as  outpatient.   Diet:  Low sodium VTE: Enoxaparin Code: Full  Dispo: Anticipated discharge to Home in 1 day pending continued diuresis and medical workup.     Armando Reichert, MD 02/13/2021, 8:33 AM Pager: [phone] After 5pm on weekdays and 1pm on weekends: Please call On Call pager [phone]

## 2021-02-13 NOTE — Progress Notes (Signed)
Inpatient Diabetes Program Recommendations  AACE/ADA: New Consensus Statement on Inpatient Glycemic Control (2015)  Target Ranges:  Prepandial:   less than 140 mg/dL      Peak postprandial:   less than 180 mg/dL (1-2 hours)      Critically ill patients:  140 - 180 mg/dL   Lab Results  Component Value Date   GLUCAP 214 (H) 02/13/2021   HGBA1C 8.9 (H) 02/08/2021    Review of Glycemic Control Results for Brenda [name], Brenda [name] (MRN [id number]) as of 02/13/2021 12:03  Ref. Range 02/12/2021 19:46 02/12/2021 23:24 02/13/2021 05:02 02/13/2021 11:17  Glucose-Capillary Latest Ref Range: 70 - 99 mg/dL 264 (H) 228 (H) 153 (H) 214 (H)   Diabetes history: DM2 Outpatient Diabetes medications: NPH 70 units BID, Regular 10 units BID (if CBG over 200 mg/dl) Current orders for Inpatient glycemic control: Lantus 45 units QHS, Novolog 5 units TID, Novolog 0-20 units Q4H  Inpatient Diabetes Program Recommendations:    Consider changing correction to Novolog 0-20 TID & HS now that patient has diet order. Would also increase meal coverage to Novolog 7 units TID (assuming patient is consuming >50 % of meal).   Thanks, Bronson Curb, MSN, RNC-OB Diabetes Coordinator [phone] (8a-5p)

## 2021-02-13 NOTE — Progress Notes (Signed)
Occupational Therapy Treatment Patient Details Name: Brenda [name] MRN: [id number] DOB: 08/16/1961 Today's Date: 02/13/2021    History of present illness Pt is a 60 y/o female presenting on 6/8/ 2022 to ED with complaint of leg swelling. CXR shows no significant interval change since prior examination with  predominant findings of elevated right hemidiaphragm and mild  cardiomegaly. PMH significant for morbid obesity, OSA diabetes, PCOS, HTN, STEMI s/p LAD DES   OT comments  Patient seated in recliner upon entry. Reviewed energy conservation techniques with patient and provided handout for her to review.  Issued toileting aide, simulated use with supervision. Pt reports using long sponge, reacher and sock aide since last session- which have been very helpful.  SpO2 on RA during ADL tasks 92%, but pt with 4/4 dyspnea with minimal activity.  Will follow acutely.    Follow Up Recommendations  No OT follow up;Supervision - Intermittent    Equipment Recommendations  None recommended by OT    Recommendations for Other Services      Precautions / Restrictions Precautions Precautions: Fall Restrictions Weight Bearing Restrictions: No       Mobility Bed Mobility               General bed mobility comments: returned to EOB after OT session with supervision    Transfers Overall transfer level: Independent                    Balance Overall balance assessment: Needs assistance Sitting-balance support: No upper extremity supported;Feet supported Sitting balance-Leahy Scale: Good     Standing balance support: No upper extremity supported Standing balance-Leahy Scale: Good                             ADL either performed or assessed with clinical judgement   ADL Overall ADL's : Needs assistance/impaired     Grooming: Independent;Standing;Sitting Grooming Details (indicate cue type and reason): fatigues easily in standing                  Toilet Transfer: Independent   Toileting- Clothing Manipulation and Hygiene: Supervision/safety;Sit to/from stand;With adaptive equipment Toileting - Clothing Manipulation Details (indicate cue type and reason): simulated technique with use of toileting aide, pt declined use with therapist present (no need to use restroom) but will use when she needs to use bathroom     Functional mobility during ADLs: Independent       Vision   Vision Assessment?: No apparent visual deficits   Perception     Praxis      Cognition Arousal/Alertness: Awake/alert Behavior During Therapy: WFL for tasks assessed/performed Overall Cognitive Status: Within Functional Limits for tasks assessed                                          Exercises     Shoulder Instructions       General Comments SpO2 92% throughout session on RA, Dyspnea 4/4 with in room ADL tasks    Pertinent Vitals/ Pain       Pain Assessment: Faces Faces Pain Scale: No hurt  Home Living  Prior Functioning/Environment              Frequency  Min 2X/week        Progress Toward Goals  OT Goals(current goals can now be found in the care plan section)  Progress towards OT goals: Progressing toward goals  Acute Rehab OT Goals Patient Stated Goal: to lose weight and improve activity tolerance OT Goal Formulation: With patient  Plan Discharge plan remains appropriate;Frequency remains appropriate    Co-evaluation                 AM-PAC OT "6 Clicks" Daily Activity     Outcome Measure   Help from another person eating meals?: None Help from another person taking care of personal grooming?: A Little Help from another person toileting, which includes using toliet, bedpan, or urinal?: A Little Help from another person bathing (including washing, rinsing, drying)?: A Little Help from another person to put on and taking off regular  upper body clothing?: A Little Help from another person to put on and taking off regular lower body clothing?: A Little 6 Click Score: 19    End of Session    OT Visit Diagnosis: Other abnormalities of gait and mobility (R26.89);Muscle weakness (generalized) (M62.81);Pain   Activity Tolerance Patient tolerated treatment well   Patient Left with call bell/phone within reach;Other (comment) (seated EOB)   Nurse Communication Mobility status        Time: 1683-7290 OT Time Calculation (min): 18 min  Charges: OT General Charges $OT Visit: 1 Visit OT Treatments $Self Care/Home Management : 8-22 mins  Jolaine Artist, OT Acute Rehabilitation Services Pager [phone] Office [phone]    [name] 02/13/2021, 12:32 PM

## 2021-02-13 NOTE — Progress Notes (Signed)
Heart Failure Stewardship Pharmacist Progress Note   PCP: Charlott Rakes, MD PCP-Cardiologist: Sherren Mocha, MD    HPI:  60 yo F w/ PMH of OSA, DM, PCOS, HTN, STEMI s/p LAD DES who presented to ED complaining of leg swelling x2 weeks that was unresolved with OTC Diurex. Also endorsed orthopnea, fatigue, and DOE. Does not f/u w/ PCP regularly due to lack of insurance and notes difficulty affording medications. Recent ECHO revealed EF 60-65%.  Current HF Medications: - Furosemide 40mg  BID - Lisinopril 2.5mg  daily - Feraheme 510mg  IV x2 doses   Prior to admission HF Medications: - Lisinopril 2.5mg  daily  Pertinent Lab Values: Serum creatinine 0.97, BUN 12, Potassium 3.9, Sodium 133, BNP 302.6, Magnesium 2.0, A1c 8.9 Iron panel (6/8) - TSAT 5, Ferritin 6  Vital Signs: Weight: 337 lbs (admission weight: 353 lbs) Blood pressure: 100s/50s  Heart rate: 80s   Medication Assistance / Insurance Benefits Check: Does the patient have prescription insurance?  No  Does the patient qualify for medication assistance through manufacturers or grants?   Yes Eligible grants and/or patient assistance programs: Pending Medication assistance applications in progress: N/A  Medication assistance applications approved: N/A Approved medication assistance renewals will be completed by: Pending  Outpatient Pharmacy:  Prior to admission outpatient pharmacy: [facility] Is the patient willing to use [facility] pharmacy at discharge? Pending Is the patient willing to transition their outpatient pharmacy to utilize a [facility] outpatient pharmacy?   Pending    Assessment: 1. Acute on chronic diastolic CHF (EF 74-16%), due to NICM (OSA). NYHA class III symptoms. - Still has bilateral LEE, lungs clear - Decreased furosemide from 40mg  TID to 40mg  BID due to low BP (dips into 80s/40s around midnight 6/12-13) - Continued on lisinopril 2.5mg  daily - Completed two doses of IV Feraheme - Consider SGLT2 for more  diuresis - Given drops in BP, hold off on ARNi, MRA - Hold off on BB given BP and volume on board.    Plan: 1) Medication changes recommended at this time: - Recommended adding Farxiga 10mg  daily  2) Patient assistance: - No insurance, will work on patient assistance programs  3)  Education  - To be completed prior to discharge - HF TOC f/u 6/21  Michela Pitcher) [place], PharmD Student

## 2021-02-13 NOTE — Progress Notes (Signed)
Physical Therapy Treatment Patient Details Name: Brenda [name] MRN: [id number] DOB: [birth year] Today's Date: 02/13/2021    History of Present Illness Pt is a 60 y/o female presenting on 6/8/ 2022 to ED with complaint of leg swelling. CXR shows no significant interval change since prior examination with  predominant findings of elevated right hemidiaphragm and mild  cardiomegaly. PMH significant for morbid obesity, OSA diabetes, PCOS, HTN, STEMI s/p LAD DES    PT Comments    Pt continues to have limited activity tolerance and significant dyspnea. SpO2 with room air >90% throughout session. Encouraged pt to continue to progress ambulation program at home as well as continue her chair yoga that she participates in.    Follow Up Recommendations  No PT follow up;Supervision - Intermittent (Home ex and ambulation progression)     Equipment Recommendations       Recommendations for Other Services       Precautions / Restrictions Precautions Precautions: Fall    Mobility  Bed Mobility               General bed mobility comments: Pt sitting EOB    Transfers Overall transfer level: Independent                  Ambulation/Gait Ambulation/Gait assistance: Supervision Gait Distance (Feet): 65 Feet (x 2 with seated rest break on rollator) Assistive device: 4-wheeled walker (bariatric rollator) Gait Pattern/deviations: Step-through pattern;Wide base of support Gait velocity: decr Gait velocity interpretation: 1.31 - 2.62 ft/sec, indicative of limited community ambulator General Gait Details: Verbal cues for brake management of rollator before sitting for a rest break.   Stairs             Wheelchair Mobility    Modified Rankin (Stroke Patients Only)       Balance Overall balance assessment: Needs assistance Sitting-balance support: No upper extremity supported;Feet supported Sitting balance-Leahy Scale: Good     Standing balance support: No upper  extremity supported Standing balance-Leahy Scale: Good                              Cognition Arousal/Alertness: Awake/alert Behavior During Therapy: WFL for tasks assessed/performed Overall Cognitive Status: Within Functional Limits for tasks assessed                                        Exercises      General Comments General comments (skin integrity, edema, etc.): SpO2 >90% on RA at rest and with activity. Dyspnea 4/4 with amb      Pertinent Vitals/Pain Pain Assessment: Faces Faces Pain Scale: No hurt    Home Living                      Prior Function            PT Goals (current goals can now be found in the care plan section) Acute Rehab PT Goals Patient Stated Goal: to lose weight and improve activity tolerance Progress towards PT goals: Progressing toward goals    Frequency    Min 3X/week      PT Plan Current plan remains appropriate    Co-evaluation              AM-PAC PT "6 Clicks" Mobility   Outcome Measure  Help needed turning from your back to  your side while in a flat bed without using bedrails?: None Help needed moving from lying on your back to sitting on the side of a flat bed without using bedrails?: None Help needed moving to and from a bed to a chair (including a wheelchair)?: None Help needed standing up from a chair using your arms (e.g., wheelchair or bedside chair)?: None Help needed to walk in hospital room?: None Help needed climbing 3-5 steps with a railing? : A Little 6 Click Score: 23    End of Session   Activity Tolerance: Patient limited by fatigue Patient left: in bed;with call bell/phone within reach   PT Visit Diagnosis: Other abnormalities of gait and mobility (R26.89)     Time: 3419-6222 PT Time Calculation (min) (ACUTE ONLY): 19 min  Charges:  $Gait Training: 8-22 mins                     [place] Pager [phone] Office  [place] 02/13/2021, 12:00 PM

## 2021-02-13 NOTE — Plan of Care (Signed)

## 2021-02-13 NOTE — Progress Notes (Signed)
Orthopedic Tech Progress Note Patient Details:  Brenda [name] 1961-01-10 [id number]  Ortho Devices Type of Ortho Device: Brenda Parma boot Ortho Device/Splint Location: Bilateral Lower Extremities Ortho Device/Splint Interventions: Ordered, Application   Post Interventions Patient Tolerated: Well Instructions Provided: Care of device, Poper ambulation with device  [name] Lorel [name] 02/13/2021, 2:39 PM

## 2021-02-14 ENCOUNTER — Other Ambulatory Visit (HOSPITAL_COMMUNITY): Payer: Self-pay

## 2021-02-14 LAB — CBC
HCT: 32.5 % — ABNORMAL LOW (ref 36.0–46.0)
Hemoglobin: 9 g/dL — ABNORMAL LOW (ref 12.0–15.0)
MCH: 20.8 pg — ABNORMAL LOW (ref 26.0–34.0)
MCHC: 27.7 g/dL — ABNORMAL LOW (ref 30.0–36.0)
MCV: 75.2 fL — ABNORMAL LOW (ref 80.0–100.0)
Platelets: 246 10*3/uL (ref 150–400)
RBC: 4.32 MIL/uL (ref 3.87–5.11)
RDW: 22.7 % — ABNORMAL HIGH (ref 11.5–15.5)
WBC: 9.8 10*3/uL (ref 4.0–10.5)
nRBC: 0.4 % — ABNORMAL HIGH (ref 0.0–0.2)

## 2021-02-14 LAB — BASIC METABOLIC PANEL
Anion gap: 9 (ref 5–15)
BUN: 8 mg/dL (ref 6–20)
CO2: 32 mmol/L (ref 22–32)
Calcium: 8.9 mg/dL (ref 8.9–10.3)
Chloride: 94 mmol/L — ABNORMAL LOW (ref 98–111)
Creatinine, Ser: 0.77 mg/dL (ref 0.44–1.00)
GFR, Estimated: >60 mL/min (ref >60)
Glucose, Bld: 92 mg/dL (ref 70–99)
Potassium: 3.7 mmol/L (ref 3.5–5.1)
Sodium: 135 mmol/L (ref 135–145)

## 2021-02-14 LAB — GLUCOSE, CAPILLARY
Glucose-Capillary: 97 mg/dL (ref 70–99)
Glucose-Capillary: 112 mg/dL — ABNORMAL HIGH (ref 70–99)
Glucose-Capillary: 205 mg/dL — ABNORMAL HIGH (ref 70–99)
Glucose-Capillary: 310 mg/dL — ABNORMAL HIGH (ref 70–99)

## 2021-02-14 LAB — MAGNESIUM: Magnesium: 1.8 mg/dL (ref 1.7–2.4)

## 2021-02-14 MED ORDER — "INSULIN SYRINGE-NEEDLE U-100 30G X 1/2"" 0.3 ML MISC"
Freq: Every day | 2 refills | Status: DC
  Filled 2021-02-14: qty 150, fill #0

## 2021-02-14 MED ORDER — INSULIN NPH (HUMAN) (ISOPHANE) 100 UNIT/ML ~~LOC~~ SUSP
22.0000 [IU] | Freq: Two times a day (BID) | SUBCUTANEOUS | 11 refills | Status: DC
  Filled 2021-02-14: qty 10, 23d supply, fill #0

## 2021-02-14 MED ORDER — INSULIN REGULAR HUMAN 100 UNIT/ML IJ SOLN
15.0000 [IU] | Freq: Three times a day (TID) | INTRAMUSCULAR | 0 refills | Status: DC
  Filled 2021-02-14: qty 20, 30d supply, fill #0

## 2021-02-14 MED ORDER — POTASSIUM CHLORIDE 20 MEQ PO PACK
40.0000 meq | PACK | Freq: Once | ORAL | Status: AC
  Administered 2021-02-14: 40 meq via ORAL
  Filled 2021-02-14: qty 2

## 2021-02-14 MED ORDER — FUROSEMIDE 40 MG PO TABS
40.0000 mg | ORAL_TABLET | Freq: Every day | ORAL | 0 refills | Status: DC
  Filled 2021-02-14: qty 30, 30d supply, fill #0

## 2021-02-14 MED ORDER — MAGNESIUM SULFATE 2 GM/50ML IV SOLN
2.0000 g | Freq: Once | INTRAVENOUS | Status: AC
  Administered 2021-02-14: 2 g via INTRAVENOUS
  Filled 2021-02-14: qty 50

## 2021-02-14 MED ORDER — "INSULIN SYRINGE-NEEDLE U-100 30G X 1/2"" 0.3 ML MISC"
Freq: Every day | 0 refills | Status: DC
  Filled 2021-02-14: qty 150, 30d supply, fill #0

## 2021-02-14 MED ORDER — ASPIRIN 81 MG PO TBEC
81.0000 mg | DELAYED_RELEASE_TABLET | Freq: Every day | ORAL | 11 refills | Status: DC
  Filled 2021-02-14: qty 30, 30d supply, fill #0

## 2021-02-14 NOTE — TOC Transition Note (Addendum)
Transition of Care [facility]) - CM/SW Discharge Note   Patient Details  Name: Brenda [name] MRN: [id number] Date of Birth: [birth year]  [facility]) CM/SW Contact:  Zenon Mayo, RN Phone Number: 02/14/2021, 2:58 PM   Clinical Narrative:    Patient is for dc home today, NCM assisted patient with [facility] for medications.  TOC pharmacy filled the meds.  Zach with [employer] is working on [facility] as outpatient for patient. Dr. Rosita Kea states it is ok for patient to be discharged today and follow up outpatient  for triology.   NCM is assisting patient with triology with LOG, will do 319.00 for the first month and then patient will have to pay 210.00 for each month after (she will ownit at 13 months).  This was approved by Olga Coaster , supervisor.  Per Staff RN patient may need home oxygen also. [employer] will do home oxygen for charity and triology will be done on LOG. For bipap settings.   Final next level of care: Home/Self Care Barriers to Discharge: No Barriers Identified   Patient Goals and CMS Choice Patient states their goals for this hospitalization and ongoing recovery are:: return home   Choice offered to / list presented to : NA  Discharge Placement                       Discharge Plan and Services                DME Arranged: NIV DME Agency: [facility] Date DME Agency Contacted: 02/10/21 Time DME Agency Contacted: 1600 Representative spoke with at DME Agency: [place]: NA          Social Determinants of Health ([place]) Interventions Food Insecurity Interventions: Assist with Merchant navy officer Strain Interventions: Development worker, community, Other (Comment) (provided pt. with CAFA application and referral to [facility] outpatient clinic for disability and food stamp application needs) Housing Interventions: Intervention Not Indicated Transportation Interventions: Intervention Not Indicated   Readmission Risk Interventions Readmission  Risk Prevention Plan 10/28/2019  Post Dischage Appt Complete  Medication Screening Complete  Transportation Screening Complete  Some recent data might be hidden

## 2021-02-14 NOTE — Plan of Care (Signed)

## 2021-02-14 NOTE — Progress Notes (Signed)
Inpatient Diabetes Program Recommendations  AACE/ADA: New Consensus Statement on Inpatient Glycemic Control (2015)  Target Ranges:  Prepandial:   less than 140 mg/dL      Peak postprandial:   less than 180 mg/dL (1-2 hours)      Critically ill patients:  140 - 180 mg/dL   Lab Results  Component Value Date   GLUCAP 112 (H) 02/14/2021   HGBA1C 8.9 (H) 02/08/2021    Review of Glycemic Control Results for Brenda [name], Brenda [name] (MRN [id number]) as of 02/14/2021 10:40  Ref. Range 02/13/2021 20:03 02/13/2021 23:54 02/14/2021 04:11 02/14/2021 07:12  Glucose-Capillary Latest Ref Range: 70 - 99 mg/dL 189 (H) 121 (H) 97 112 (H)    Inpatient Diabetes Program Recommendations:    Novolog 0-20 units TID and 0-5 units QHS as patient has a diet order.   Will continue to follow while inpatient.  Thank you, Reche Dixon, RN, BSN Diabetes Coordinator Inpatient Diabetes Program [phone] (team pager from 8a-5p)

## 2021-02-14 NOTE — Discharge Summary (Addendum)
Name: Brenda [name] MRN: [id number] DOB: April 09, 1961 60 y.o. PCP: Charlott Rakes, MD  Date of Admission: 02/08/2021  6:55 AM Date of Discharge:  02/14/21 Attending Physician: Terrial Rhodes MD  Discharge Diagnosis: Acute right heart failure H/o Obstructive Sleep apnea Respiratory Acidosis and compensatory metabolic alkalosis CAD, h/o STEMI with LAD DES T2DM Iron Deficiency Anemia    Discharge Medications: Allergies as of 02/14/2021       Reactions   Hydrocodone Shortness Of Breath   "I forget to breathe."    Clopidogrel Bisulfate    REACTION: rash   Hydrocodeine [dihydrocodeine]         Medication List     STOP taking these medications    DIUREX PO   folic acid 656 MCG tablet Commonly known as: FOLVITE   MAGNESIUM-POTASSIUM PO   metoprolol tartrate 25 MG tablet Commonly known as: LOPRESSOR       TAKE these medications    Aspirin Low Dose 81 MG EC tablet Generic drug: aspirin Take 1 tablet (81 mg total) by mouth daily.   atorvastatin 80 MG tablet Commonly known as: LIPITOR Take 1 tablet (80 mg total) by mouth every evening.   furosemide 40 MG tablet Commonly known as: LASIX Take 1 tablet (40 mg total) by mouth daily. Start taking on: February 15, 2021   glucose blood test strip Commonly known as: True Metrix Blood Glucose Test Use as instructed   HumuLIN N 100 UNIT/ML injection Generic drug: insulin NPH Human Inject 0.22 mLs (22 Units total) into the skin 2 (two) times daily before a meal. What changed:  medication strength how much to take when to take this   Insulin Pen Needle 30G X 8 MM Misc Commonly known as: NOVOFINE Use as directed with Novolin 70/30 insulin   Insulin Syringe-Needle U-100 30G X 1/2" 0.3 ML Misc use as directed 5 (five) times daily with insulin   lisinopril 2.5 MG tablet Commonly known as: ZESTRIL Take 1 tablet (2.5 mg total) by mouth daily.   nitroGLYCERIN 0.4 MG SL tablet Commonly known as:  NITROSTAT Place 1 tablet (0.4 mg total) under the tongue every 5 (five) minutes as needed for chest pain (up to 3 doses. If taking 3rd dose call 911).   NovoLIN R 100 units/mL injection Generic drug: insulin regular Inject 0.15 mLs (15 Units total) into the skin 3 (three) times daily before meals. What changed:  medication strength how much to take how to take this when to take this reasons to take this   prasugrel 10 MG Tabs tablet Commonly known as: EFFIENT Take 1 tablet (10 mg total) by mouth daily. Pt needs to make appt with provider for further refills - 1st attempt What changed: additional instructions   True Metrix Meter Devi 1 each by Does not apply route 3 (three) times daily.   TRUEplus Lancets 30G Misc 1 each by Does not apply route 3 (three) times daily.               Discharge Care Instructions  (From admission, onward)           Start     Ordered   02/14/21 0000  Leave dressing on - Keep it clean, dry, and intact until clinic visit        02/14/21 1254            Disposition and follow-up:   [name] was discharged from [facility] in Stable condition.  At the  hospital follow up visit please address:  1.  Follow up: Follow up in IM clinic in 1 week.  Pt will need follow up by PCP and colon cancer screening as outpatient. Paperwork completed with [employer] for home trilogy but it was not approved because medicaid will not cover it. Pt may need Sleep study which can be discussed with PCP at follow up.  Pt will need follow up with Gynecology for Hirsutism.  2.  Labs / imaging needed at time of follow-up: BMP, CBC  3.  Pending labs/ test needing follow-up: None  Follow-up Appointments:  Follow-up Information     Charlott Rakes, MD. Go on 03/22/2021.   Specialty: Family Medicine Why: @2 :30pm Contact information: [place] [address] [phone]         Sherren Mocha, MD .    Specialty: Cardiology Contact information: [address] [place] [address] [phone]         [place]. Go in 11 day(s).   Specialty: Cardiology Why: Tuesday 02/21/21 at 3:00pm parking code [phone] please bring all your medications Contact information: [address] [phone] [place]. Schedule an appointment as soon as possible for a visit in 1 week(s).   Why: We sent the request for appointment at [facility] clinic in 1 week. Someone will call you for the follow up appointment. If you don't hear back, please call at the phone no listed. Contact information: [address] [place] [facility] Course by problem list: #Acute right heart failure Patient came in with edema of b/l legs with Sob.Pitting edema on examination. BNP elevated at 302.6. Echo on admission showed new moderately reduced right ventricular systolic function, compared to echo 1 year ago.  D-dimer elevated at 1.52. DVT ruled out by negative ultrasound of lower extremities. Presumed etiology is untreated OSA. Other risk factors include STEMI, morbid obesity. Pt was started on Lasix 40 mg BID which was increased to three times daily. [name] edema improved but BP dropped so Lasix changed to once daily dose. K and Mg supplemented when low. She was also started on CPAP at night which she used it consistently during this hospitalization. She had net -10 L during inpatient hospitalization.  Weight down 18.4 pounds. She was put on Unna boots to help with b/l leg swelling and recommended to continue until follow up appointment in a week. Pt was recommended to follow up at IM clinic in 1 week. We will look for other options such as Farxiga at follow up visit.   #History of OSA #Respiratory acidosis with compensatory  metabolic alkalosis. OSA diagnosed 20 years ago. Mentions non-adherence to CPAP due to lack of insurance and discomfort with face mask. ABG on 6/12 revealed pCO2. Pt used CPAP at night during inpatient hospitalization and tolerated it well with no discomfort. Trilogy paperwork completed and sent to [facility].  Ms. Trickey continues to exhibit signs of hypercapnia associated with obesity hypoventilation syndrome.  The use of the NIV will treat patient's high PC02 levels (62.5 on 02/12/21 with elevated bicarbonate of 35.3) and can reduce risk of exacerbations and future hospitalizations when used at night and during the day.  BiLevel/RAD has been considered and ruled out as patient requires portability, which is not  possible with BiLevel/RAD devices, to assist with future outpatient visits to her cardiologist and PCP.  Interruption or failure to provide NIV would quickly lead to exacerbation of the patient's condition, hospital admission, and likely harm to the patient. Continued use is preferred.  Patient is able to protect their airways and clear secretions on their own. Paperwork completed with [employer] for home trilogy but it was not approved because medicaid will not cover it. Pt may need Sleep study which can be discussed with PCP at follow up.   #CAD #H/o STEMI with LAD DES Patient had stent to LAD in 2010.  Also had STEMI with DES 2021.  She has been on DAPT. Has not seen cardiology recently and was recommended to stay on DAPT for atleast 1 year from last stent. Recommendations for lifelong Prasugrel in 2010. Pt recommnded to continue DAPT for now and  f/u with cardiology at discharge.   #T2DM Pt was using 70/30 insulin at home but she was rationing it. Pt was started on Lantus which was titrated to 45 units and resistant scale insulin with meal coverage of 7 units Novalog. Pt was discharged on 15 units regular insulin with meals and NPH 22 units BID morning and night.   #Iron deficiency  anemia Microcytic anemia workup on admission suggest iron deficiency anemia. Reports hx of IDA but could not afford medications.  Patient given 2 doses of Feraheme. Hemoglobin stable at 9.0 at discharge. Pt will need follow up by PCP and colon cancer screening as outpatient.   # Hirsutism PCOS. Will need follow up with Gynecology.   Subjective on day of discharge: Examined and evaluated at bedside this am. Mentions having no difficulty with urination or bowel movement. Mentions also improvement in respiratory status. Endorsing some loose phlegm. Mentions also seeing improvement in lower extremity swelling with the UNNA boots.  Discharge Exam:   BP 140/68   Pulse 84   Temp 98.4 F (36.9 C) (Oral)   Resp 16   Ht 5\' 1"  (1.549 m)   Wt (!) 153.5 kg Comment: scale b  SpO2 91%   BMI 63.92 kg/m  Discharge exam: General: Morbidly obese woman lying on hospital bed comfortably, not in acute distress. hirsutism on face HEENT: Unable to access JVD secondary to body habitus, moist mucous membranes, anicteric sclera.  Cardiovascular: Normal rate and rhythm. No murmurs/rubs/gallops. Extremities : 1+ pitting edema in lower extremities Unna boots  in place. Pulmonary : Distant breath sounds, no Rales, wheezing, rhonchi Musculoskeletal: No deformity, injury, tenderness in extremities. Psychiatric-mood and affect normal   Pertinent Labs, Studies, and Procedures:  DG Chest Port 1 View  Result Date: 02/08/2021 CLINICAL DATA:  Dyspnea EXAM: PORTABLE CHEST 1 VIEW COMPARISON:  CT chest abdomen and pelvis 10/26/2019 FINDINGS: The heart is enlarged. Mild pulmonary vascular congestion. The right hemidiaphragm is elevated, unchanged from prior exam. Lungs are clear. IMPRESSION: No significant interval change since prior examination with predominant findings of elevated right hemidiaphragm and mild cardiomegaly. Electronically Signed   By: Miachel Roux M.D.   On: 02/08/2021 08:07   ECHOCARDIOGRAM  COMPLETE  Result Date: 02/08/2021    ECHOCARDIOGRAM REPORT   Patient Name:   Brenda [name] Date of Exam: 02/08/2021 Medical Rec #:  [id number]           Height:       62.0 in Accession #:    [id number]          Weight:       296.8  lb Date of Birth:  15-Feb-1961           BSA:          2.261 m Patient Age:    36 years            BP:           143/79 mmHg Patient Gender: F                   HR:           69 bpm. Exam Location:  Inpatient Procedure: 2D Echo, Cardiac Doppler, Color Doppler and Intracardiac            Opacification Agent Indications:    X52.84 Chronic systolic (congestive) heart failure  History:        Patient has prior history of Echocardiogram examinations, most                 recent 10/26/2019. CAD; Risk Factors:Hypertension, Diabetes,                 Dyslipidemia and Sleep Apnea.  Sonographer:    Tiffany Dance Referring Phys: [id number] [place] Comments: Technically difficult study due to poor echo windows, no subcostal window and patient is morbidly obese. Image acquisition challenging due to patient body habitus. IMPRESSIONS  1. Left ventricular ejection fraction, by estimation, is 60 to 65%. The left ventricle has normal function. The left ventricle has no regional wall motion abnormalities.  2. Right ventricular systolic function is moderately reduced. The right ventricular size is mildly enlarged.  3. The mitral valve is normal in structure. No evidence of mitral valve regurgitation. No evidence of mitral stenosis.  4. The aortic valve is normal in structure. Aortic valve regurgitation is not visualized. No aortic stenosis is present.  5. The inferior vena cava is normal in size with greater than 50% respiratory variability, suggesting right atrial pressure of 3 mmHg. FINDINGS  Left Ventricle: Left ventricular ejection fraction, by estimation, is 60 to 65%. The left ventricle has normal function. The left ventricle has no regional wall motion abnormalities. Definity contrast  agent was given IV to delineate the left ventricular  endocardial borders. The left ventricular internal cavity size was normal in size. There is no left ventricular hypertrophy. Right Ventricle: The right ventricular size is mildly enlarged. No increase in right ventricular wall thickness. Right ventricular systolic function is moderately reduced. Left Atrium: Left atrial size was normal in size. Right Atrium: Right atrial size was normal in size. Pericardium: There is no evidence of pericardial effusion. Mitral Valve: The mitral valve is normal in structure. No evidence of mitral valve regurgitation. No evidence of mitral valve stenosis. Tricuspid Valve: The tricuspid valve is normal in structure. Tricuspid valve regurgitation is not demonstrated. No evidence of tricuspid stenosis. Aortic Valve: The aortic valve is normal in structure. Aortic valve regurgitation is not visualized. No aortic stenosis is present. Pulmonic Valve: The pulmonic valve was normal in structure. Pulmonic valve regurgitation is not visualized. No evidence of pulmonic stenosis. Aorta: The aortic root is normal in size and structure. Venous: The inferior vena cava is normal in size with greater than 50% respiratory variability, suggesting right atrial pressure of 3 mmHg. IAS/Shunts: No atrial level shunt detected by color flow Doppler.  LEFT VENTRICLE PLAX 2D LVIDd:         5.20 cm LVIDs:         3.50 cm LV PW:  1.90 cm LV IVS:        1.10 cm LVOT diam:     2.00 cm LV SV:         51 LV SV Index:   22 LVOT Area:     3.14 cm  RIGHT VENTRICLE RV S prime:     7.95 cm/s TAPSE (M-mode): 2.2 cm LEFT ATRIUM         Index LA diam:    5.00 cm 2.21 cm/m  AORTIC VALVE LVOT Vmax:   73.20 cm/s LVOT Vmean:  53.600 cm/s LVOT VTI:    0.161 m  AORTA Ao Root diam: 3.10 cm Ao Asc diam:  3.50 cm MITRAL VALVE MV Area (PHT): 2.44 cm     SHUNTS MV Decel Time: 312 msec     Systemic VTI:  0.16 m MV E velocity: 123.50 cm/s  Systemic Diam: 2.00 cm MV A  velocity: 99.75 cm/s MV E/A ratio:  1.24 Candee Furbish MD Electronically signed by Candee Furbish MD Signature Date/Time: 02/08/2021/4:40:13 PM    Final    VAS [place] LOWER EXTREMITY VENOUS (DVT)  Result Date: 02/09/2021  Lower Venous DVT Study Patient Name:  Brenda [name] Yeargan  Date of Exam:   02/09/2021 Medical Rec #: [id number]            Accession #:    [id number] Date of Birth: 1960-09-10            Patient Gender: F Patient Age:   84Y Exam Location:  [facility] Procedure:      VAS [place] LOWER EXTREMITY VENOUS (DVT) Referring Phys: [id number] [place] --------------------------------------------------------------------------------  Indications: Swelling.  Risk Factors: None identified. Limitations: Body habitus and poor ultrasound/tissue interface. Comparison Study: No prior studies. Performing Technologist: Oliver Hum RVT  Examination Guidelines: A complete evaluation includes B-mode imaging, spectral Doppler, color Doppler, and power Doppler as needed of all accessible portions of each vessel. Bilateral testing is considered an integral part of a complete examination. Limited examinations for reoccurring indications may be performed as noted. The reflux portion of the exam is performed with the patient in reverse Trendelenburg.  +---------+---------------+---------+-----------+----------+-------------------+ RIGHT    CompressibilityPhasicitySpontaneityPropertiesThrombus Aging      +---------+---------------+---------+-----------+----------+-------------------+ CFV      Full           Yes      Yes                                      +---------+---------------+---------+-----------+----------+-------------------+ SFJ      Full                                                             +---------+---------------+---------+-----------+----------+-------------------+ FV Prox  Full                                                              +---------+---------------+---------+-----------+----------+-------------------+ FV Mid   Full                                                             +---------+---------------+---------+-----------+----------+-------------------+  FV Distal               Yes      Yes                                      +---------+---------------+---------+-----------+----------+-------------------+ PFV      Full                                                             +---------+---------------+---------+-----------+----------+-------------------+ POP      Full           Yes      Yes                                      +---------+---------------+---------+-----------+----------+-------------------+ PTV      Full                                                             +---------+---------------+---------+-----------+----------+-------------------+ PERO                                                  Not well visualized +---------+---------------+---------+-----------+----------+-------------------+   +---------+---------------+---------+-----------+----------+-------------------+ LEFT     CompressibilityPhasicitySpontaneityPropertiesThrombus Aging      +---------+---------------+---------+-----------+----------+-------------------+ CFV      Full           Yes      Yes                                      +---------+---------------+---------+-----------+----------+-------------------+ SFJ      Full                                                             +---------+---------------+---------+-----------+----------+-------------------+ FV Prox  Full                                                             +---------+---------------+---------+-----------+----------+-------------------+ FV Mid   Full                                                             +---------+---------------+---------+-----------+----------+-------------------+ FV  Distal  Yes      Yes                                      +---------+---------------+---------+-----------+----------+-------------------+ PFV      Full                                                             +---------+---------------+---------+-----------+----------+-------------------+ POP      Full           Yes      Yes                                      +---------+---------------+---------+-----------+----------+-------------------+ PTV      Full                                                             +---------+---------------+---------+-----------+----------+-------------------+ PERO                                                  Not well visualized +---------+---------------+---------+-----------+----------+-------------------+     Summary: RIGHT: - There is no evidence of deep vein thrombosis in the lower extremity. However, portions of this examination were limited- see technologist comments above.  - No cystic structure found in the popliteal fossa.  LEFT: - There is no evidence of deep vein thrombosis in the lower extremity. However, portions of this examination were limited- see technologist comments above.  - No cystic structure found in the popliteal fossa.  *See table(s) above for measurements and observations. Electronically signed by Monica Martinez MD on 02/09/2021 at 7:43:01 PM.    Final     Discharge Instructions: Discharge Instructions     Call MD for:  difficulty breathing, headache or visual disturbances   Complete by: As directed    Call MD for:  extreme fatigue   Complete by: As directed    Call MD for:  severe uncontrolled pain   Complete by: As directed    Call MD for:  temperature >100.4   Complete by: As directed    Diet - low sodium heart healthy   Complete by: As directed    Increase activity slowly   Complete by: As directed    Leave dressing on - Keep it clean, dry, and intact until clinic visit   Complete  by: As directed      Dear Brenda [name],   Thank you for letting us participate in your care! In this section, you will find a brief hospital admission summary of why you were admitted to the hospital, what happened during your admission, your diagnosis/diagnoses, and recommended follow up.  You were admitted because you were experiencing Shortness of breath and edema  Your testing revealed acute right heart failure. You were diagnosed with Acute Right failure. You were  treated with diuretics and CPAP.  Your symptoms improved and you were discharged from the hospital for meeting this goal.  We filled out application for your home CPAP machine. They will contact you when approved.   POST-HOSPITAL & CARE INSTRUCTIONS Follow up with IM clinic in 1 week. Follow up with Cardiology clinic on 02/21/21 at 3 pm. Paperwork completed with [employer] for home trilogy but it was not approved because medicaid will not cover it. Pt may need Sleep study which can be discussed with PCP at follow up.  Pt will need follow up by PCP and colon cancer screening as outpatient. Pt will need follow up with Gynecology for hirsutism. Please take your insulin regularly as prescribed. Please use CPAP at night regularly after you get that. Please let PCP/Specialists know of any changes in medications that were made.  Please see medications section of this packet for any medication changes.   DOCTOR'S APPOINTMENTS & FOLLOW UP Future Appointments  Date Time Provider [place]  02/21/2021  3:00 PM [facility] None  03/22/2021  2:30 PM Charlott Rakes, MD [facility] None   Thank you for choosing [facility]! Take care and be well!  Internal Medicine Teaching Service Inpatient Team [facility]  [place], [place] [address] [phone]  Signed: Armando Reichert, MD 02/14/2021, 2:45 PM   Pager: [phone]

## 2021-02-14 NOTE — Progress Notes (Signed)
Heart Failure Stewardship Pharmacist Progress Note   PCP: Charlott Rakes, MD PCP-Cardiologist: Sherren Mocha, MD    HPI:  60 yo F w/ PMH of OSA, DM, PCOS, HTN, STEMI s/p LAD DES who presented to ED complaining of leg swelling x2 weeks that was unresolved with OTC Diurex. Also endorsed orthopnea, fatigue, and DOE. Does not f/u w/ PCP regularly due to lack of insurance and notes difficulty affording medications. Recent ECHO revealed EF 60-65%.  Current HF Medications: - Furosemide 40mg  daily - Lisinopril 2.5mg  daily - Feraheme 510mg  IV x2 doses   Prior to admission HF Medications: - Lisinopril 2.5mg  daily  Pertinent Lab Values: Serum creatinine 0.77, BUN 8, Potassium 3.7, Sodium 135, BNP 302.6, Magnesium 2.0, A1c 8.9 Iron panel (6/8) - TSAT 5, Ferritin 6  Vital Signs: Weight: 338 lbs (admission weight: 353 lbs) Blood pressure: 90s/50s  Heart rate: 80s   Medication Assistance / Insurance Benefits Check: Does the patient have prescription insurance?  No  Does the patient qualify for medication assistance through manufacturers or grants?   Yes Eligible grants and/or patient assistance programs: Pending Medication assistance applications in progress: N/A  Medication assistance applications approved: N/A Approved medication assistance renewals will be completed by: Pending  Outpatient Pharmacy:  Prior to admission outpatient pharmacy: [facility] Is the patient willing to use [facility] pharmacy at discharge? Yes Is the patient willing to transition their outpatient pharmacy to utilize a [facility] outpatient pharmacy?   Pending    Assessment 1. Acute on chronic diastolic CHF (EF 88-28%), due to NICM (OSA). NYHA class II symptoms. - Plan for discharge today - Lungs clear, still notes 1+ LEE - Completed 2 doses of IV Feraheme - Continue furosemide 40mg  daily - Continued on lisinopril 2.5mg  daily - Consider Farxiga at f/u appointment   Plan: 1) Medication changes recommended at  this time: - Consider Farxiga 10mg  daily at f/u appointment  2) Patient assistance: - No insurance, will work on patient assistance programs  3)  Education  - HF TOC f/u 6/21  Michela Pitcher) [place], PharmD Student

## 2021-02-14 NOTE — Progress Notes (Signed)
D/C instructions given and reviewed. Tele and IV removed, tolerated well. Awaiting family to transport home.

## 2021-02-14 NOTE — Progress Notes (Signed)
SATURATION QUALIFICATIONS: (This note is used to comply with regulatory documentation for home oxygen)  Patient Saturations on Room Air at Rest = 96%  Patient Saturations on Room Air while Ambulating = 82%  Patient Saturations on 2 Liters of oxygen while Ambulating = 94%  Please briefly explain why patient needs home oxygen:  Pt couldn't take more than a few steps wothout sitting down and [facility]

## 2021-02-14 NOTE — Discharge Instructions (Addendum)
Dear Brenda [name],   Thank you for letting us participate in your care! In this section, you will find a brief hospital admission summary of why you were admitted to the hospital, what happened during your admission, your diagnosis/diagnoses, and recommended follow up.  You were admitted because you were experiencing Shortness of breath and edema  Your testing revealed acute right heart failure. You were diagnosed with Acute Right failure. You were treated with diuretics and CPAP.  Your symptoms improved and you were discharged from the hospital for meeting this goal.  We filled out application for your home CPAP machine. They will contact you when approved.  POST-HOSPITAL & CARE INSTRUCTIONS Follow up with IM clinic in 1 week. Follow up with Cardiology clinic on 02/21/21 at 3 pm. Pt will need follow up by PCP and colon cancer screening as outpatient. Pt will need follow up for Trilogy on outpatient basis. Paperwork completed with [employer].  Pt will need follow up with Gynecology for hirsutism. Please take your insulin regularly as prescribed. Please use CPAP at night regularly after you get that. Please let PCP/Specialists know of any changes in medications that were made.  Please see medications section of this packet for any medication changes.  DOCTOR'S APPOINTMENTS & FOLLOW UP Future Appointments  Date Time Provider [place]  02/21/2021  3:00 PM [place] [facility] None  03/22/2021  2:30 PM Charlott Rakes, MD [facility] None   Thank you for choosing [facility]! Take care and be well!  [facility]  [address] [place], [place] [address] [phone]

## 2021-02-15 ENCOUNTER — Telehealth: Payer: Self-pay

## 2021-02-15 NOTE — Telephone Encounter (Signed)
Transition Care Management Unsuccessful Follow-up Telephone Call  Date of discharge and from where:  02/14/2021, [facility]   Attempts:  1st Attempt  Reason for unsuccessful TCM follow-up call:  Left voice message on # [phone].  Call back requested to this CM

## 2021-02-16 ENCOUNTER — Telehealth: Payer: Self-pay

## 2021-02-16 NOTE — Telephone Encounter (Signed)
From the discharge call:  She said she is doing better then  explained that she was told when she left the hospital that she needed the trilogy and the hospital would pay for the first month and she would have to pay the monthly cost thereafter.  She said she hasn't heard anything about this since she was discharged.  She has the O2 from [employer]; but nothing about the trilogy.  Message sent to Tomi Bamberger, RN CM about the status of the trilogy order.  She also explained that she has wraps on her legs for the swelling and was told to change them every other day but she is not able to do that. The wraps are unna boots.  Instructed her to call Internal Medicine for guidance as she has an appointment there on Monday 02/20/2021.  She also verbalized frustration with trying to schedule appointments at [facility]  She was seen by Dr Margarita Rana 11/2019 and prior to that was seen in 2017. She said she was frustrated trying to schedule appointments to see her provider so she gave up. She explained that she was told that appointments were only scheduled 2 weeks out and whenever she called she could not get an appointment.  She was very frustrated with the access to care.  This CM explained that we have other clinics that we work with including a mobile medical unit that have improved our access for patients.  She plans to follow up with Internal Medicine next week and will decide if she wishes to keep her follow up at [facility] 03/22/2021 with Dr Margarita Rana.   she said she has all medications and did not have any questions about the med regime.  She was given 2 bottles of insulin when she left the hospital.  She said that she uses her O2 @ 2 L during the day but has not been using O2 at night.

## 2021-02-16 NOTE — Telephone Encounter (Signed)
Transition Care Management Follow-up Telephone Call Date of discharge and from where: 02/14/2021, [facility]  How have you been since you were released from the hospital? She said she is doing better Any questions or concerns? Yes - she explained that she was told when she left the hospital that she needed the trilogy and the hospital would pay for the first month and she would have to pay the monthly cost thereafter.  She said she hasn't heard anything about this since she was discharged.  She has the O2 from [employer]; but nothing about the trilogy.  Message sent to Tomi Bamberger, RN CM about the status of the trilogy order.  She also explained that she has wraps on her legs for the swelling and was told to change them every other day but she is not able to do that.  Instructed her to call Internal Medicine for guidance as she has an appointment there on Monday 02/20/2021.  She also verbalized frustration with trying to schedule appointments at [facility]  She was seen by Dr Margarita Rana 11/2019 and prior to that was seen in 2017. She said she was frustrated trying to schedule appointments to see her provider so she gave up. She explained that she was told that appointments were only scheduled 2 weeks out and whenever she called she could not get an appointment.  She was very frustrated with the access to care.  This CM explained that we have other clinics that we work with including a mobile medical unit that have improved our access for patients.  She plans to follow up with Internal Medicine next week and will decide if she wishes to keep her follow up at [facility] 03/22/2021.   Items Reviewed: Did the pt receive and understand the discharge instructions provided? Yes  Medications obtained and verified? Yes  she said she has all medications and did not have any questions about the med regime.  She was given 2 bottles of insulin when she left the hospital.  Other? No  Any new allergies since your discharge? No   Dietary orders reviewed? No Do you have support at home? Yes  - her 19 yo grandson lives with her and her daughters check on her regularly.   Home Care and Equipment/Supplies: Were home health services ordered? no If so, what is the name of the agency? N/a  Has the agency set up a time to come to the patient's home? not applicable Were any new equipment or medical supplies ordered?  Yes: O2 What is the name of the medical supply agency? [employer] [facility] Were you able to get the supplies/equipment? yes Do you have any questions related to the use of the equipment or supplies? No  She said that she uses her O2 @ 2 L during the day but has not been using O2 at night.  She has as glucometer.   Functional Questionnaire: (I = Independent and D = Dependent) ADLs: independent.  Has rollator to use if needed    Follow up appointments reviewed:  PCP Hospital f/u appt confirmed? Yes  Scheduled to see Dr Margarita Rana  on 03/22/2021 @ 1430. [facility] f/u appt confirmed? Yes  Scheduled to see Heart and Vascular on 02/21/2021 @ 1500. Are transportation arrangements needed? No  If their condition worsens, is the pt aware to call PCP or go to the Emergency Dept.? Yes Was the patient provided with contact information for the PCP's office or ED? Yes Was to pt encouraged to call  back with questions or concerns? Yes

## 2021-02-16 NOTE — Telephone Encounter (Signed)
Per Tomi Bamberger, RN CM:  [employer] [facility] will contact the patient and set up the time for them to bring the bipap over to set up

## 2021-02-20 ENCOUNTER — Ambulatory Visit (INDEPENDENT_AMBULATORY_CARE_PROVIDER_SITE_OTHER): Payer: Self-pay | Admitting: Student

## 2021-02-20 VITALS — BP 167/82 | HR 82 | Temp 97.8°F | Wt 342.6 lb

## 2021-02-20 DIAGNOSIS — E118 Type 2 diabetes mellitus with unspecified complications: Secondary | ICD-10-CM

## 2021-02-20 DIAGNOSIS — I10 Essential (primary) hypertension: Secondary | ICD-10-CM

## 2021-02-20 DIAGNOSIS — F32A Depression, unspecified: Secondary | ICD-10-CM

## 2021-02-20 DIAGNOSIS — I5032 Chronic diastolic (congestive) heart failure: Secondary | ICD-10-CM

## 2021-02-20 DIAGNOSIS — Z794 Long term (current) use of insulin: Secondary | ICD-10-CM

## 2021-02-20 DIAGNOSIS — G4733 Obstructive sleep apnea (adult) (pediatric): Secondary | ICD-10-CM

## 2021-02-20 DIAGNOSIS — F419 Anxiety disorder, unspecified: Secondary | ICD-10-CM

## 2021-02-20 NOTE — Progress Notes (Signed)
   CC: hospital follow-up  HPI:  [name] is a 60 y.o. with medical history as below presenting to [facility] for hospital follow-up after being hospitalized for acute on chronic heart failure exacerbation.  Please see problem-based list for further details, assessments, and plans.  Past Medical History:  Diagnosis Date   Arthritis    "back, hands, hips" (02/22/2015)   CAD (coronary artery disease)    a. complex LAD/diagonal bifurcation PCI in 2010. b. STEMI 10/2019 s/p PTCA/DES x1 to mLAD overlapping the old stent, residual disease treated medicaly.   Depression    Diabetes mellitus ([place])    "started when I was pregnant; not sure if it was type 1 or type 2"    Hypercholesterolemia    Hypertension    Morbid obesity ([place])    Sleep apnea    "suppose to wear mask; I don't" (02/22/2015)   Review of Systems:  As per HPI  Physical Exam:  Vitals:   02/20/21 1525  BP: (!) 167/82  Pulse: 82  Temp: 97.8 F (36.6 C)  TempSrc: Oral  SpO2: 100%  Weight: (!) 342 lb 9.6 oz (155.4 kg)   General: Obese, sitting in wheelchair in no acute distress CV: Regular rate, rhythm. No murmurs, rubs, gallops appreciated. Distal extremities warm. Pulm: Normal work of breathing on 2L supplemental oxygen. Minimal air movement throughout. No wheezing, rales, rhonchi appreciated. MSK: 1-2+ pitting edema bilaterally to knees. Feet: Bilateral feet without open wounds or pressure sores.  Skin: Warm, dry. No rashes or lesions appreciated. Neuro: Awake, alert, answering questions appropriately. Psych: Normal mood, affect, speech  Assessment & Plan:   See Encounters Tab for problem based charting.  Patient discussed with Dr.  Jimmye Norman

## 2021-02-20 NOTE — Patient Instructions (Signed)
Ms.Cela Zehava Turski, it was a pleasure seeing you today!  Today we discussed:  - Heart failure, blood pressure, and diabetes - No medication changes - I have referred you to our pharmacist and chronic care manager for further assistance.  Referrals ordered today:   Referral Orders  Amb Referral to Clinical Pharmacist  AMB Referral to [facility]ordinaton    Follow-up: 1 month  Please make sure to arrive 15 minutes prior to your next appointment. If you arrive late, you may be asked to reschedule.   We look forward to seeing you next time. Please call our clinic at [phone] if you have any questions or concerns. The best time to call is Monday-Friday from 9am-4pm, but there is someone available 24/7. If after hours or the weekend, call the main hospital number and ask for the Internal Medicine Resident On-Call. If you need medication refills, please notify your pharmacy one week in advance and they will send [place] a request.  Thank you for letting us take part in your care. Wishing you the best!  Thank you, Sanjuan Dame, MD

## 2021-02-21 ENCOUNTER — Encounter (HOSPITAL_COMMUNITY): Payer: Self-pay

## 2021-02-21 ENCOUNTER — Ambulatory Visit (HOSPITAL_COMMUNITY)
Admit: 2021-02-21 | Discharge: 2021-02-21 | Disposition: A | Discharge status: Home / Self Care | Payer: Medicaid Other | Source: Ambulatory Visit | Attending: Internal Medicine | Admitting: Internal Medicine

## 2021-02-21 ENCOUNTER — Telehealth (HOSPITAL_COMMUNITY): Payer: Self-pay | Admitting: Surgery

## 2021-02-21 ENCOUNTER — Other Ambulatory Visit: Payer: Self-pay

## 2021-02-21 VITALS — BP 158/89 | HR 67 | Wt 341.0 lb

## 2021-02-21 DIAGNOSIS — Z7982 Long term (current) use of aspirin: Secondary | ICD-10-CM | POA: Diagnosis not present

## 2021-02-21 DIAGNOSIS — Z596 Low income: Secondary | ICD-10-CM | POA: Diagnosis not present

## 2021-02-21 DIAGNOSIS — I110 Hypertensive heart disease with heart failure: Secondary | ICD-10-CM | POA: Insufficient documentation

## 2021-02-21 DIAGNOSIS — I252 Old myocardial infarction: Secondary | ICD-10-CM | POA: Diagnosis not present

## 2021-02-21 DIAGNOSIS — Z955 Presence of coronary angioplasty implant and graft: Secondary | ICD-10-CM | POA: Diagnosis not present

## 2021-02-21 DIAGNOSIS — Z9861 Coronary angioplasty status: Secondary | ICD-10-CM

## 2021-02-21 DIAGNOSIS — Z79899 Other long term (current) drug therapy: Secondary | ICD-10-CM | POA: Insufficient documentation

## 2021-02-21 DIAGNOSIS — Z5941 Food insecurity: Secondary | ICD-10-CM | POA: Insufficient documentation

## 2021-02-21 DIAGNOSIS — D509 Iron deficiency anemia, unspecified: Secondary | ICD-10-CM | POA: Insufficient documentation

## 2021-02-21 DIAGNOSIS — Z87891 Personal history of nicotine dependence: Secondary | ICD-10-CM | POA: Diagnosis not present

## 2021-02-21 DIAGNOSIS — I5032 Chronic diastolic (congestive) heart failure: Secondary | ICD-10-CM

## 2021-02-21 DIAGNOSIS — E118 Type 2 diabetes mellitus with unspecified complications: Secondary | ICD-10-CM

## 2021-02-21 DIAGNOSIS — G4733 Obstructive sleep apnea (adult) (pediatric): Secondary | ICD-10-CM | POA: Insufficient documentation

## 2021-02-21 DIAGNOSIS — E119 Type 2 diabetes mellitus without complications: Secondary | ICD-10-CM | POA: Insufficient documentation

## 2021-02-21 DIAGNOSIS — Z8249 Family history of ischemic heart disease and other diseases of the circulatory system: Secondary | ICD-10-CM | POA: Insufficient documentation

## 2021-02-21 DIAGNOSIS — E282 Polycystic ovarian syndrome: Secondary | ICD-10-CM | POA: Diagnosis not present

## 2021-02-21 DIAGNOSIS — I2510 Atherosclerotic heart disease of native coronary artery without angina pectoris: Secondary | ICD-10-CM | POA: Diagnosis not present

## 2021-02-21 DIAGNOSIS — I5030 Unspecified diastolic (congestive) heart failure: Secondary | ICD-10-CM | POA: Insufficient documentation

## 2021-02-21 DIAGNOSIS — Z794 Long term (current) use of insulin: Secondary | ICD-10-CM | POA: Insufficient documentation

## 2021-02-21 LAB — BASIC METABOLIC PANEL
Anion gap: 9 (ref 5–15)
BUN: 8 mg/dL (ref 6–20)
CO2: 32 mmol/L (ref 22–32)
Calcium: 9.2 mg/dL (ref 8.9–10.3)
Chloride: 95 mmol/L — ABNORMAL LOW (ref 98–111)
Creatinine, Ser: 0.65 mg/dL (ref 0.44–1.00)
GFR, Estimated: >60 mL/min (ref >60)
Glucose, Bld: 93 mg/dL (ref 70–99)
Potassium: 4.2 mmol/L (ref 3.5–5.1)
Sodium: 136 mmol/L (ref 135–145)

## 2021-02-21 LAB — BRAIN NATRIURETIC PEPTIDE: B Natriuretic Peptide: 200.5 pg/mL — ABNORMAL HIGH (ref 0.0–100.0)

## 2021-02-21 MED ORDER — PRASUGREL HCL 10 MG PO TABS: 10.0000 mg | ORAL_TABLET | Freq: Every day | ORAL | 1 refills | Status: DC

## 2021-02-21 MED ORDER — ENTRESTO 24-26 MG PO TABS: 1.0000 | ORAL_TABLET | Freq: Two times a day (BID) | ORAL | 11 refills | Status: DC

## 2021-02-21 NOTE — Progress Notes (Signed)
[facility] Transitions of Care Clinic  PCP: Charlott Rakes  Primary Cardiologist: Sherren Mocha  HPI:  Brenda [name] is a 60 y.o.  female  with a PMH significant for morbid obesity, OSA, T2DM, PCOS, HTN, h/o CAD    2000 NSTEMI and underwent complex bifurcation stenting of the LAD and first diagonal.   April 2014 she complained of exertional chest discomfort and dyspnea. She underwent repeat heart catheterization demonstrating severe in-stent restenosis in the diagonal branch and moderate in-stent restenosis of the LAD. Because of challenges around procedural technique with her body habitus and complexity of her coronary anatomy, medical therapy was recommended.  10/2019 developed severe chest pain found to have anterior STEMI.  Received PCI with DES to LAD (overlapping with old stent); chronic ost Dx stenosis (jailed by stent).  ECHO with EF 60-65%, mild concentric LVH, G1DD, normal RV function  02/2021 admitted with 3 weeks of progressive edema, dyspnea despite OTC diurex usage.  Had a repeat ECHO with EF 60-65%, moderately reduced RV function.  She was started on diuresis with IV lasix wt trend 356>338lbs.    Since leaving the hospital her edema has improved significantly and dyspnea has improved slightly but still requiring oxygen and having significant shortness of breath with simple tasks.  Had a new cpap machine brought to her house this afternoon and she will start using it tonight.  Weight has been slowly decreasing since discharge from the hospital by her home scales.  By our scale her weight is stable.     ROS: All systems negative except as listed in HPI, PMH and Problem List.  SH:  Social History   Socioeconomic History   Marital status: Married    Spouse name: Not on file   Number of children: 2   Years of education: Not on file   Highest education level: Not on file  Occupational History   Occupation: Housewife  Tobacco Use   Smoking status:  Former    Packs/day: 0.50    Years: 1.00    Pack years: 0.50    Types: Cigarettes, Cigars    Quit date: 09/03/1986    Years since quitting: 34.4   Smokeless tobacco: Never  Substance and Sexual Activity   Alcohol use: No   Drug use: No   Sexual activity: Not Currently  Other Topics Concern   Not on file  Social History Narrative   Not on file   Social Determinants of Health   Financial Resource Strain: High Risk   Difficulty of Paying Living Expenses: Hard  Food Insecurity: Food Insecurity Present   Worried About [place] in the Last Year: Sometimes true   Ran Out of Food in the Last Year: Sometimes true  Transportation Needs: No Transportation Needs   Lack of Transportation (Medical): No   Lack of Transportation (Non-Medical): No  Physical Activity: Not on file  Stress: Not on file  Social Connections: Not on file  Intimate Partner Violence: Not on file    FH:  Family History  Problem Relation Age of Onset   Heart attack Father        in his mid 98s   COPD Father    Coronary artery disease Mother        in her mid 62s   Hypertension Mother    Cancer Mother    Diabetes Sister    Coronary artery disease Sister        valve replacement   Stroke Sister  multiple    Past Medical History:  Diagnosis Date   Arthritis    "back, hands, hips" (02/22/2015)   CAD (coronary artery disease)    a. complex LAD/diagonal bifurcation PCI in 2010. b. STEMI 10/2019 s/p PTCA/DES x1 to mLAD overlapping the old stent, residual disease treated medicaly.   Depression    Diabetes mellitus ([place])    "started when I was pregnant; not sure if it was type 1 or type 2"    Hypercholesterolemia    Hypertension    Morbid obesity ([place])    Sleep apnea    "suppose to wear mask; I don't" (02/22/2015)    Current Outpatient Medications  Medication Sig Dispense Refill   aspirin 81 MG EC tablet Take 1 tablet (81 mg total) by mouth daily. 30 tablet 11   atorvastatin (LIPITOR) 80  MG tablet Take 1 tablet (80 mg total) by mouth every evening. 90 tablet 3   Blood Glucose Monitoring Suppl (TRUE METRIX METER) DEVI 1 each by Does not apply route 3 (three) times daily. 1 Device 0   furosemide (LASIX) 40 MG tablet Take 1 tablet (40 mg total) by mouth daily. 30 tablet 0   glucose blood (TRUE METRIX BLOOD GLUCOSE TEST) test strip Use as instructed 100 each 5   insulin NPH Human (HUMULIN N) 100 UNIT/ML injection Inject 0.22 mLs (22 Units total) into the skin 2 (two) times daily before a meal. 10 mL 11   Insulin Pen Needle (NOVOFINE) 30G X 8 MM MISC Use as directed with Novolin 70/30 insulin 100 each 1   insulin regular (NOVOLIN R) 100 units/mL injection Inject 0.15 mLs (15 Units total) into the skin 3 (three) times daily before meals. 30 mL 0   Insulin Syringe-Needle U-100 30G X 1/2" 0.3 ML MISC use as directed 5 (five) times daily with insulin 150 each 2   nitroGLYCERIN (NITROSTAT) 0.4 MG SL tablet Place 1 tablet (0.4 mg total) under the tongue every 5 (five) minutes as needed for chest pain (up to 3 doses. If taking 3rd dose call 911). 25 tablet 3   prasugrel (EFFIENT) 10 MG TABS tablet Take 1 tablet (10 mg total) by mouth daily. Pt needs to make appt with provider for further refills - 1st attempt 30 tablet 1   TRUEPLUS LANCETS 30G MISC 1 each by Does not apply route 3 (three) times daily. 100 each 5   lisinopril (ZESTRIL) 2.5 MG tablet Take 1 tablet (2.5 mg total) by mouth daily. 90 tablet 3   No current facility-administered medications for this encounter.    Vitals:   02/21/21 1450  BP: (!) 158/89  Pulse: 67  SpO2: 98%  Weight: (!) 154.7 kg (341 lb)    PHYSICAL EXAM: Cardiac: JVD difficult to assess, normal rate and rhythm, clear s1 and s2, no murmurs, rubs or gallops, 2+ LE edema Pulmonary: decreased breath sounds bilaterally, not in distress Abdominal: non distended abdomen, soft and nontender Psych: Alert, conversant, in good spirits    ASSESSMENT &  PLAN:  Diastolic CHF, RV dysfunction: -Likely 2/2 untreated OSA and obesity  -02/2021 Had a repeat ECHO with EF 60-65%, moderately reduced RV function -NYHA Class III symptoms, remains volume overloaded  -continue lasix 40mg  reports good output weight going in the right direction -switch lisinopril to entresto 24/26 after washout period 48hrs   OSA -untreated for years -inpatient team was able to arrange home bipap it arrives today   CAD -complex coronary history last [facility] 10/2019 Received PCI with  DES to LAD (overlapping with old stent); chronic ost Dx stenosis (jailed by stent).   -continue DAPT lifelong recommendation from Dr. Burt Knack -no s/s of angina, no bleeding   T2DM -not a great SGLT2i candidate currently with poor control and body habitus   Iron deficiency anemia -s/p feraheme 510mg  x2 during inpatient stay will need repeat iron studies in 6 weeks -denies any bleeding   Follow up with [facility] has not seen in a year will place referral

## 2021-02-21 NOTE — Telephone Encounter (Signed)
I called patient to remind her of Heart Impact Appt and provide directions to clinic if necessary.  She is aware and plans to come to appt.

## 2021-02-21 NOTE — Assessment & Plan Note (Signed)
PHQ 11 today. Will continue to monitor at subsequent appointments. Unfortunately, did not have time during our appointment today to further discuss this issue.

## 2021-02-21 NOTE — Assessment & Plan Note (Addendum)
Brenda [name] was recently hospitalized for acute on chronic diastolic heart failure. She was discharged 6/14 on her home 2L supplemental oxygen. Since this time, Brenda [name] reports that she has been doing fairly well. Denies chest pain, further dyspnea, abdominal pain, nausea, vomiting, worsening leg swelling. Reports compliance with her medications, including furosemide. She is to follow-up with the heart failure clinic tomorrow.  Unfortunately, the biggest barrier to Brenda [name]' care is her uninsured status. We have given her a financial packet for her to fill out so she can obtain an orange card. Will also have chronic care management and our clinical pharmacist assist in getting medications and with other social determinants of health. Will continue with her current regimen. Likely would benefit from SGLT2i, Entresto in the future if they can be obtained. - Furosemide 40mg  daily - Lisinopril 2.5mg  daily - Follow-up with heart failure clinic tomorrow - Return to clinic in one month

## 2021-02-21 NOTE — Progress Notes (Signed)
[facility] Center Transitions of Care Clinic Heart Failure Pharmacist Encounter  PCP: Charlott Rakes, MD PCP-Cardiologist: Sherren Mocha, MD  Patient assistance application for Delene Loll has been initiated. Patient portion has been completed and signed. Faxed to [place] for Dr. Burt Knack to complete and sign provider portion.   Once application is complete, can be faxed to [phone]. [employer] phone number for follow up calls and questions: [phone]  Patient also provided with free 30-day card for Entresto to begin therapy while patient assistance is being completed.   Kerby Nora, PharmD, BCPS Heart Failure Transitions of Care Clinic Pharmacist [phone]

## 2021-02-21 NOTE — Assessment & Plan Note (Addendum)
During Ms. Raphael' recent admission, found to have A1c 8.9%, which was improved from previous labs. She has been on long-term regular insulin and Novolin due to financial circumstances. Reports that her sugars have been okay since discharge. Mentions that she has been trying to eat better, but she has frequent setbacks. Further describes eating well one day then eating poorly the next day because she is more hungry. She has had some intermittent lightheadedness when her blood sugars are on the lower side. Ms. Kerney further explains that she has been on this regimen for a long time and knows how to adjust it based on her food options.  Ms. Terhaar would greatly benefit from a better, more consistent regimen along with dietary assistance. She has been given financial paperwork today in order for [place] to complete these goals. Suspect that moving forward she would greatly benefit from GLP-1, SGLT2i. Will also refer to clinical pharmacist for further input. - NPH 22 units twice daily - Novolin 15 units three times daily with meals  - Clinical pharmacist referral - Consider diabetes educator when insurance is obtained

## 2021-02-21 NOTE — Assessment & Plan Note (Signed)
Brenda [name] reports she was initially diagnosed with sleep apnea 25-30 years ago. However, she has not had a CPAP at home. During her recent hospitalization, she was able to have CPAP and obtain coverage for home Triligy for at least one month. Will reach out to our clinical pharmacist and chronic care management for further assistance. Brenda [name] is at high risk for poor outcomes without optimal medical treatment, which includes CPAP. - Continue CPAP nightly - Referral clinical pharmacist - Referral community care management

## 2021-02-21 NOTE — Patient Instructions (Addendum)
Labs done today. We will contact you only if your labs are abnormal.  STOP taking Lisinopril  START Entresto 24-26mg  (1 tablet) by mouth 2 times daily 48 HOURS AFTER STOPPING LISINOPRIL.  No other medication changes were made. Please continue all current medications as prescribed  You have been referred to Richardson Dopp at [facility]. His office will contact you to schedule an appointment.

## 2021-02-21 NOTE — Assessment & Plan Note (Addendum)
BP Readings from Last 3 Encounters:  02/20/21 (!) 167/82  02/14/21 140/68  12/01/19 110/70   Blood pressure elevated during this appointment. Per chart review, appears titration of blood pressure while inpatient was limited due to hypotension given she is preload dependent. She has an appointment with her cardiologist tomorrow. At this time denies chest pain, change in chronic dyspnea, headache, palpitations.   She likely will need titration of her blood pressure medications. However, her financial status is limiting this. For now will continue with current regimen and have her follow-up with cardiology tomorrow. Will also consult our clinical pharmacist for further assistance. - Continue lisinopril 2.5mg  daily - Follow-up with cardiology tomorrow - Return to clinic in one month - Referral clinical pharmacist

## 2021-02-21 NOTE — Progress Notes (Signed)
Heart and Vascular Care Navigation  02/21/2021  Brenda [name] 1961-07-26 [id number]  Reason for Referral:Patient was seen in HF TOC.   Engaged with patient face to face for initial visit for Heart and Vascular Care Coordination.                                                                                                   Assessment:  Patient is a 60yo  female who has been separated from her husband for 20 years. She has two adult daughters ages 30 and 81 yo. She reports her sister died a few years ago and she now has custody of her 15yo nephew Erlene Quan. She lives in a single family home that is owned by her husband who allows her to live there rent free with the 33 yo nephew. 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                                   HRT/VAS Care Coordination     Patients Home Cardiology Office --  [facility]   Outpatient Care Team Social Worker   Social Worker Name: Raquel Sarna, [place] [phone]   Living arrangements for the past 2 months Single Family Home   Lives with: Relatives  15yo nephew   Patient Current Insurance Coverage Self-Pay   Patient Has Concern With Paying Medical Bills Yes   Does Patient Have Prescription Coverage? No   Home Assistive Devices/Equipment Oxygen; Scales   DME Agency [employer]  Home O2 2 liters   HH Agency NA       Social History:                                                                             SDOH Screenings   Alcohol Screen: Not on file  Depression (PHQ2-9): Medium Risk   PHQ-2 Score: 11  Financial Resource Strain: High Risk   Difficulty of Paying Living Expenses: Hard  Food Insecurity: Food Insecurity Present   Worried About Charity fundraiser in the Last Year: Sometimes true   Ran Out of Food in the Last Year: Sometimes true  Housing: Low Risk    Last Housing Risk Score: 0  Physical Activity:  Not on file  Social Connections: Not on file  Stress: Not on file  Tobacco Use: Medium Risk   Smoking Tobacco Use: Former   Smokeless Tobacco Use: Never  Transportation Needs: No Transportation Needs   Lack of Transportation (Medical): No   Lack of Transportation (Non-Medical): No    SDOH Interventions: Financial Resources:    Fish farm manager for Disability application assistance  and [facility] for assistance with application.  Food Insecurity:   Advice worker  Housing Insecurity:   N/a  Transportation:    N/a    Other Care Navigation Interventions:     Inpatient/Outpatient Substance Abuse Counseling/Rehab Options N/a  Provided Pharmacy assistance resources  Entresto Patient Assistance pending  Patient expressed Mental Health concerns Yes, Referred to:  Patient will attend [facility] for support needed  Patient Referred to: N/a   Follow-up plan:  Patient referred to [facility] for assistance with disability and food stamp applications. Patient will attend monthly heart sisters support group for coping and adjusting to life with cardiac diagnosis. Patient verbalizes understanding of follow up and will contact CSW if needed. Raquel Sarna, [place], [place]

## 2021-02-22 ENCOUNTER — Telehealth: Payer: Self-pay

## 2021-02-22 NOTE — Addendum Note (Signed)
Encounter addended by: Katherine Roan, MD on: 02/22/2021 4:19 PM  Actions taken: Level of Service modified

## 2021-02-22 NOTE — Telephone Encounter (Signed)
   Telephone encounter was:  Successful.  02/22/2021 Name: Brenda [name] MRN: [id number] DOB: [birth year]  Brenda [name] is a 60 y.o. year old female who is a primary care patient of Charlott Rakes, MD . The community resource team was consulted for assistance with Transportation Needs   Care guide performed the following interventions: Spoke with patient about transportation resources: IT trainer, [employer] and [employer] if [place] is approved. Patient stated she did not have any other needs at this time..  Follow Up Plan:  No further follow up planned at this time. The patient has been provided with needed resources.  Brenda [name], AAS Paralegal, [employer]  [address], [place] [address] ??[name]  ?? [phone]   [email or web address]

## 2021-02-22 NOTE — Telephone Encounter (Signed)
**Note De-Identified Adriane Guglielmo Obfuscation** We received a completed [employer] pt asst application for [employer] fax from [place], [place] with a request for [place] to complete the provider page and to fax to [employer] once MD signs it.  Since Dr Burt Knack is out of the office until 7/6 and we do not want to hold the pts application up, I completed the provider page of the application with our DOD, Dr Tanna Furry information and have e-mailed all to our clinical supervisor so she can obtain Dr Tanna Furry signature and to fax to [employer] at the fax number written on the cover letter included.

## 2021-02-23 NOTE — Telephone Encounter (Addendum)
Dr. Lovena Le signed and form was faxed to [employer].

## 2021-03-01 ENCOUNTER — Telehealth (HOSPITAL_COMMUNITY): Payer: Self-pay

## 2021-03-01 NOTE — Telephone Encounter (Signed)
Patient has been approved to receive Entresto from [employer] patient assistance from 02/27/21-02/27/22. They have been unable to reach the patient to set up initial delivery. I have put in the request with [employer] to send the first 90 day supply of Entresto to this patient's home. Expected delivery date of 03/07/21.   Left VM for patient to inform her of this information and provided callback number for questions.   [employer] patient assistance phone number for questions: [phone]

## 2021-03-22 ENCOUNTER — Encounter: Payer: Self-pay | Admitting: Family Medicine

## 2021-03-22 ENCOUNTER — Other Ambulatory Visit: Payer: Self-pay

## 2021-03-22 ENCOUNTER — Ambulatory Visit: Payer: Self-pay | Attending: Family Medicine | Admitting: Family Medicine

## 2021-03-22 VITALS — BP 124/77 | HR 90 | Ht 61.0 in | Wt 314.0 lb

## 2021-03-22 DIAGNOSIS — I5032 Chronic diastolic (congestive) heart failure: Secondary | ICD-10-CM

## 2021-03-22 DIAGNOSIS — F32A Depression, unspecified: Secondary | ICD-10-CM

## 2021-03-22 DIAGNOSIS — F419 Anxiety disorder, unspecified: Secondary | ICD-10-CM

## 2021-03-22 DIAGNOSIS — I2510 Atherosclerotic heart disease of native coronary artery without angina pectoris: Secondary | ICD-10-CM

## 2021-03-22 DIAGNOSIS — Z9861 Coronary angioplasty status: Secondary | ICD-10-CM

## 2021-03-22 DIAGNOSIS — J9610 Chronic respiratory failure, unspecified whether with hypoxia or hypercapnia: Secondary | ICD-10-CM | POA: Insufficient documentation

## 2021-03-22 DIAGNOSIS — J9611 Chronic respiratory failure with hypoxia: Secondary | ICD-10-CM

## 2021-03-22 DIAGNOSIS — Z794 Long term (current) use of insulin: Secondary | ICD-10-CM

## 2021-03-22 DIAGNOSIS — E118 Type 2 diabetes mellitus with unspecified complications: Secondary | ICD-10-CM

## 2021-03-22 DIAGNOSIS — E785 Hyperlipidemia, unspecified: Secondary | ICD-10-CM

## 2021-03-22 DIAGNOSIS — E1169 Type 2 diabetes mellitus with other specified complication: Secondary | ICD-10-CM

## 2021-03-22 MED ORDER — NITROGLYCERIN 0.4 MG SL SUBL
0.4000 mg | SUBLINGUAL_TABLET | SUBLINGUAL | 3 refills | Status: DC | PRN
Start: 2021-03-22 — End: 2022-07-16

## 2021-03-22 MED ORDER — ATORVASTATIN CALCIUM 80 MG PO TABS: 80.0000 mg | ORAL_TABLET | Freq: Every evening | ORAL | 6 refills | Status: DC

## 2021-03-22 MED ORDER — EMPAGLIFLOZIN 10 MG PO TABS
10.0000 mg | ORAL_TABLET | Freq: Every day | ORAL | 3 refills | Status: DC
  Filled 2021-03-22: qty 30, 30d supply, fill #0

## 2021-03-22 MED ORDER — FLUOXETINE HCL 20 MG PO CAPS: 20.0000 mg | ORAL_CAPSULE | Freq: Every day | ORAL | 3 refills | Status: DC

## 2021-03-22 NOTE — Patient Instructions (Signed)
Empagliflozin Oral Tablets What is this medication? EMPAGLIFLOZIN (EM pa gli FLOE zin) helps to treat type 2 diabetes. It helps to control blood sugar. Treatment is combined with diet and exercise. This drug may also reduce the risk of heart attack, stroke, or death if you have type 2 diabetes and risk factors for heart disease. It also treats heart failure. Itmay lower the risk for treatment of heart failure in the hospital. This medicine may be used for other purposes; ask your health care provider orpharmacist if you have questions. COMMON BRAND NAME(S): Jardiance What should I tell my care team before I take this medication? They need to know if you have any of these conditions: dehydration diabetic ketoacidosis diet low in salt eating less due to illness, surgery, dieting, or any other reason having surgery high cholesterol high levels of potassium in the blood history of pancreatitis or pancreas problems history of yeast infection of the penis or vagina if you often drink alcohol infections in the bladder, kidneys, or urinary tract kidney disease liver disease low blood pressure on hemodialysis problems urinating type 1 diabetes uncircumcised female an unusual or allergic reaction to empagliflozin, other medicines, foods, dyes, or preservatives pregnant or trying to get pregnant breast-feeding How should I use this medication? Take this medicine by mouth with water. Take it as directed on the prescription label at the same time every day. You may take it with or without food. Keeptaking it unless your health care provider tells you to stop. A special MedGuide will be given to you by the pharmacist with eachprescription and refill. Be sure to read this information carefully each time. Talk to your health care provider about the use of this medicine in children.Special care may be needed. Overdosage: If you think you have taken too much of this medicine contact apoison control  center or emergency room at once. NOTE: This medicine is only for you. Do not share this medicine with others. What if I miss a dose? If you miss a dose, take it as soon as you can. If it is almost time for yournext dose, take only that dose. Do not take double or extra doses. What may interact with this medication? alcohol diuretics insulin This list may not describe all possible interactions. Give your health care provider a list of all the medicines, herbs, non-prescription drugs, or dietary supplements you use. Also tell them if you smoke, drink alcohol, or use illegaldrugs. Some items may interact with your medicine. What should I watch for while using this medication? Visit your health care provider for regular checks on your progress. Tell your health care provider if your symptoms do not start to get better or if they getworse. This medicine can cause a serious condition in which there is too much acid in the blood. If you develop nausea, vomiting, stomach pain, unusual tiredness, or breathing problems, stop taking this medicine and call your doctor right away.If possible, use a ketone dipstick to check for ketones in your urine. Check with your health care provider if you have severe diarrhea, nausea, and vomiting, or if you sweat a lot. The loss of too much body fluid may make itdangerous for you to take this medicine. A test called the HbA1C (A1C) will be monitored. This is a simple blood test. It measures your blood sugar control over the last 2 to 3 months. You willreceive this test every 3 to 6 months. Learn how to check your blood sugar. Learn the symptoms of   low and high bloodsugar and how to manage them. Always carry a quick-source of sugar with you in case you have symptoms of low blood sugar. Examples include hard sugar candy or glucose tablets. Make sure others know that you can choke if you eat or drink when you develop serious symptoms of low blood sugar, such as seizures or  unconsciousness. Get medicalhelp at once. Tell your health care provider if you have high blood sugar. You might need to change the dose of your medicine. If you are sick or exercising more thanusual, you may need to change the dose of your medicine. What side effects may I notice from receiving this medication? Side effects that you should report to your doctor or health care professionalas soon as possible: allergic reactions (skin rash, itching or hives, swelling of the face, lips, or tongue) breathing problems dizziness feeling faint or lightheaded, falls genital infection (fever; tenderness, redness, or swelling in the genitals or area from the genitals to the back of the rectum) kidney injury (trouble passing urine or change in the amount of urine) low blood sugar (feeling anxious; confusion; dizziness; increased hunger; unusually weak or tired; increased sweating; shakiness; cold, clammy skin; irritable; headache; blurred vision; fast heartbeat; loss of consciousness) muscle weakness nausea, vomiting, unusual stomach upset or pain new pain or tenderness, change in skin color, sores or ulcers, or infection in legs or feet penile discharge, itching, or pain unusual tiredness unusual vaginal discharge, itching, or odor urinary tract infection (fever; chills; a burning feeling when urinating; urgent need to urinate more often; blood in the urine; back pain) Side effects that usually do not require medical attention (report to yourdoctor or health care professional if they continue or are bothersome): mild increase in urination thirsty This list may not describe all possible side effects. Call your doctor for medical advice about side effects. You may report side effects to FDA at1-800-FDA-1088. Where should I keep my medication? Keep out of the reach of children and pets. Store at room temperature between 20 and 25 degrees C (68 and 77 degrees F).Get rid of any unused medicine after the  expiration date. To get rid of medicines that are no longer needed or have expired: Take the medicine to a medicine take-back program. Check with your pharmacy or law enforcement to find a location. If you cannot return the medicine, check the label or package insert to see if the medicine should be thrown out in the garbage or flushed down the toilet. If you are not sure, ask your health care provider. If it is safe to put it in the trash, take the medicine out of the container. Mix the medicine with cat litter, dirt, coffee grounds, or other unwanted substance. Seal the mixture in a bag or container. Put it in the trash. NOTE: This sheet is a summary. It may not cover all possible information. If you have questions about this medicine, talk to your doctor, pharmacist, orhealth care provider.  2022 Elsevier/Gold Standard (2020-04-22 19:51:17)  

## 2021-03-22 NOTE — Progress Notes (Signed)
Needs refill on lasix medication.

## 2021-03-22 NOTE — Progress Notes (Signed)
Subjective:  Patient ID: Brenda [name], female    DOB: 08-Jul-1961  Age: 60 y.o. MRN: [id number]  CC: Hospitalization Follow-up   HPI Brenda [name]  is a 60 year old female with a history of type 2 diabetes mellitus (A1c 11.7), obstructive sleep apnea (on CPAP), hypertension, CAD status post multiple stent, STEMI in 10/2019,  HFpEF (Ef 60-65%), chronic respiratory failure on 2L oxygen, hospitalization for CHF exacerbation in 02/2021  Interval History: Hs been out of Lasix for the last 2 days and is noticing her legs begin to swell up.denies worsening of dyspnea, has no chest pain. Last Cardiology visit was on 02/21/21. Stable on 2 L oxygen. Random sugars are between 150-250.  Fasting are around 120. She has cut back on fast food. She is on 30 units of Humulin N and 20 units of Humulin R tid (up from prescribed dose due to increased blood sugars) and she obtains this from [facility].  She has anxiety and Depression and is wondering if she can be placed on a medication for this. Denies suicidal ideations or intents. Doing well on her CPAP. She is overwhelmed due to her medical conditions but is promising to do better with regards to her lifestyle. Past Medical History:  Diagnosis Date   Arthritis    "back, hands, hips" (02/22/2015)   CAD (coronary artery disease)    a. complex LAD/diagonal bifurcation PCI in 2010. b. STEMI 10/2019 s/p PTCA/DES x1 to mLAD overlapping the old stent, residual disease treated medicaly.   Depression    Diabetes mellitus ([place])    "started when I was pregnant; not sure if it was type 1 or type 2"    Hypercholesterolemia    Hypertension    Morbid obesity ([place])    Sleep apnea    "suppose to wear mask; I don't" (02/22/2015)    Past Surgical History:  Procedure Laterality Date   CARDIAC CATHETERIZATION  12/2012   CHOLECYSTECTOMY OPEN  1989   CORONARY ANGIOPLASTY WITH STENT PLACEMENT  2011   CORONARY/GRAFT ACUTE MI REVASCULARIZATION N/A 10/26/2019    Procedure: CORONARY/GRAFT ACUTE MI REVASCULARIZATION;  Surgeon: Burnell Blanks, MD;  Location: [place] [facility];  Service: Cardiovascular;  Laterality: N/A;   HERNIA REPAIR     LEFT HEART CATH AND CORONARY ANGIOGRAPHY N/A 10/26/2019   Procedure: LEFT HEART CATH AND CORONARY ANGIOGRAPHY;  Surgeon: Burnell Blanks, MD;  Location: [place] [facility];  Service: Cardiovascular;  Laterality: N/A;   LEFT HEART CATHETERIZATION WITH CORONARY ANGIOGRAM N/A 12/25/2012   Procedure: LEFT HEART CATHETERIZATION WITH CORONARY ANGIOGRAM;  Surgeon: Sherren Mocha, MD;  Location[facility];  Service: Cardiovascular;  Laterality: N/A;   UMBILICAL HERNIA REPAIR  29's    Family History  Problem Relation Age of Onset   Heart attack Father        in his mid 68s   COPD Father    Coronary artery disease Mother        in her mid 43s   Hypertension Mother    Cancer Mother    Diabetes Sister    Coronary artery disease Sister        valve replacement   Stroke Sister        multiple    Allergies  Allergen Reactions   Hydrocodone Shortness Of Breath    "I forget to breathe."    Clopidogrel Bisulfate     REACTION: rash   Hydrocodeine [Dihydrocodeine]     Outpatient Medications Prior to Visit  Medication Sig Dispense Refill   aspirin 81 MG EC tablet Take 1 tablet (81 mg total) by mouth daily. 30 tablet 11   atorvastatin (LIPITOR) 80 MG tablet Take 1 tablet (80 mg total) by mouth every evening. 90 tablet 3   Blood Glucose Monitoring Suppl (TRUE METRIX METER) DEVI 1 each by Does not apply route 3 (three) times daily. 1 Device 0   furosemide (LASIX) 40 MG tablet Take 1 tablet (40 mg total) by mouth daily. 30 tablet 0   glucose blood (TRUE METRIX BLOOD GLUCOSE TEST) test strip Use as instructed 100 each 5   insulin NPH Human (HUMULIN N) 100 UNIT/ML injection Inject 0.22 mLs (22 Units total) into the skin 2 (two) times daily before a meal. 10 mL 11   Insulin Pen Needle (NOVOFINE) 30G X 8 MM  MISC Use as directed with Novolin 70/30 insulin 100 each 1   insulin regular (NOVOLIN R) 100 units/mL injection Inject 0.15 mLs (15 Units total) into the skin 3 (three) times daily before meals. 30 mL 0   Insulin Syringe-Needle U-100 30G X 1/2" 0.3 ML MISC use as directed 5 (five) times daily with insulin 150 each 2   nitroGLYCERIN (NITROSTAT) 0.4 MG SL tablet Place 1 tablet (0.4 mg total) under the tongue every 5 (five) minutes as needed for chest pain (up to 3 doses. If taking 3rd dose call 911). 25 tablet 3   sacubitril-valsartan (ENTRESTO) 24-26 MG Take 1 tablet by mouth 2 (two) times daily. 60 tablet 11   TRUEPLUS LANCETS 30G MISC 1 each by Does not apply route 3 (three) times daily. 100 each 5   prasugrel (EFFIENT) 10 MG TABS tablet Take 1 tablet (10 mg total) by mouth daily. Pt needs to make appt with provider for further refills - 1st attempt (Patient not taking: Reported on 03/22/2021) 30 tablet 1   No facility-administered medications prior to visit.     ROS Review of Systems  Constitutional:  Negative for activity change, appetite change and fatigue.  HENT:  Negative for congestion, sinus pressure and sore throat.   Eyes:  Negative for visual disturbance.  Respiratory:  Positive for shortness of breath. Negative for cough, chest tightness and wheezing.   Cardiovascular:  Positive for leg swelling. Negative for chest pain and palpitations.  Gastrointestinal:  Negative for abdominal distention, abdominal pain and constipation.  Endocrine: Negative for polydipsia.  Genitourinary:  Negative for dysuria and frequency.  Musculoskeletal:  Negative for arthralgias and back pain.  Skin:  Negative for rash.  Neurological:  Negative for tremors, light-headedness and numbness.  Hematological:  Does not bruise/bleed easily.  Psychiatric/Behavioral:  Positive for dysphoric mood. Negative for agitation and behavioral problems.    Objective:  BP 124/77   Pulse 90   Ht 5\' 1"  (1.549 m)   Wt  (!) 314 lb (142.4 kg)   SpO2 98%   BMI 59.33 kg/m   BP/Weight 03/22/2021 02/21/2021 05/04/5175  Systolic BP 160 737 106  Diastolic BP 77 89 82  Wt. (Lbs) 314 341 342.6  BMI 59.33 64.43 64.73      Physical Exam Constitutional:      Appearance: She is well-developed.  HENT:     Head:     Comments: On 2L Oxygen via nasal canula Neck:     Vascular: No JVD.  Cardiovascular:     Rate and Rhythm: Normal rate.     Heart sounds: Normal heart sounds. No murmur heard. Pulmonary:     Effort:  Pulmonary effort is normal.     Breath sounds: Normal breath sounds. No wheezing or rales.  Chest:     Chest wall: No tenderness.  Abdominal:     General: Bowel sounds are normal. There is no distension.     Palpations: Abdomen is soft. There is no mass.     Tenderness: There is no abdominal tenderness.  Musculoskeletal:        General: Normal range of motion.     Right lower leg: Edema present.     Left lower leg: Edema present.  Neurological:     Mental Status: She is alert and oriented to person, place, and time.  Psychiatric:     Comments: Dysphoric mood    CMP Latest Ref Rng & Units 02/21/2021 02/14/2021 02/13/2021  Glucose 70 - 99 mg/dL 93 92 229(H)  BUN 6 - 20 mg/dL 8 8 12   Creatinine 0.44 - 1.00 mg/dL 0.65 0.77 0.97  Sodium 135 - 145 mmol/L 136 135 133(L)  Potassium 3.5 - 5.1 mmol/L 4.2 3.7 3.9  Chloride 98 - 111 mmol/L 95(L) 94(L) 92(L)  CO2 22 - 32 mmol/L 32 32 32  Calcium 8.9 - 10.3 mg/dL 9.2 8.9 8.4(L)  Total Protein 6.5 - 8.1 g/dL - - -  Total Bilirubin 0.3 - 1.2 mg/dL - - -  Alkaline Phos 38 - 126 U/L - - -  AST 15 - 41 U/L - - -  ALT 0 - 44 U/L - - -    Lipid Panel     Component Value Date/Time   CHOL 111 12/16/2019 1041   TRIG 80 12/16/2019 1041   HDL 50 12/16/2019 1041   CHOLHDL 2.2 12/16/2019 1041   CHOLHDL 3.5 10/27/2019 0219   VLDL 21 10/27/2019 0219   LDLCALC 45 12/16/2019 1041   LDLDIRECT 135.9 02/22/2011 1132    CBC    Component Value Date/Time    WBC 9.8 02/14/2021 0437   RBC 4.32 02/14/2021 0437   HGB 9.0 (L) 02/14/2021 0437   HCT 32.5 (L) 02/14/2021 0437   PLT 246 02/14/2021 0437   MCV 75.2 (L) 02/14/2021 0437   MCH 20.8 (L) 02/14/2021 0437   MCHC 27.7 (L) 02/14/2021 0437   RDW 22.7 (H) 02/14/2021 0437   LYMPHSABS 1.1 02/08/2021 1642   MONOABS 0.5 02/08/2021 1642   EOSABS 0.2 02/08/2021 1642   BASOSABS 0.1 02/08/2021 1642    Lab Results  Component Value Date   HGBA1C 8.9 (H) 02/08/2021    Assessment & Plan:  1. Type 2 diabetes mellitus with complication, with long-term current use of insulin (HCC) Uncontrolled with A1c of 8.9; goal is <7.0 Jardiance added to regimen Counseled on Diabetic diet, my plate method, 500 minutes of moderate intensity exercise/week Blood sugar logs with fasting goals of 80-120 mg/dl, random of less than 180 and in the event of sugars less than 60 mg/dl or greater than 400 mg/dl encouraged to notify the clinic. Advised on the need for annual eye exams, annual foot exams, Pneumonia vaccine.  2. Anxiety and depression Due to underlying medical conditions Placed on LCSW schedule for counseling SSRI initiated - FLUoxetine (PROZAC) 20 MG capsule; Take 1 capsule (20 mg total) by mouth daily.  Dispense: 30 capsule; Refill: 3  3. Hyperlipidemia associated with type 2 diabetes mellitus (HCC) Controlled Low cholesterol diet - atorvastatin (LIPITOR) 80 MG tablet; Take 1 tablet (80 mg total) by mouth every evening.  Dispense: 30 tablet; Refill: 6  4. CAD S/P PCI of LAD  Asymptomatic Risk factor modification - nitroGLYCERIN (NITROSTAT) 0.4 MG SL tablet; Place 1 tablet (0.4 mg total) under the tongue every 5 (five) minutes as needed for chest pain (up to 3 doses. If taking 3rd dose call 911).  Dispense: 25 tablet; Refill: 3  5. Chronic diastolic heart failure (HCC) Ef 60-65%, euvolemic Has been out of Lasix and is starting to have some fluid evidence of overload. Lasix refilled Initiate  SGLT2i Continue other GDMT Follow up with Cardiology - empagliflozin (JARDIANCE) 10 MG TABS tablet; Take 1 tablet (10 mg total) by mouth daily.  Dispense: 30 tablet; Refill: 3  6. Chronic respiratory failure with hypoxia (HCC) Continue 2L Oxygen    No orders of the defined types were placed in this encounter.   Return in about 3 months (around 06/22/2021) for Medical conditions.       Charlott Rakes, MD, FAAFP. [facility] [place], [place]   03/22/2021, 2:46 PM

## 2021-03-23 ENCOUNTER — Other Ambulatory Visit: Payer: Self-pay | Admitting: Family Medicine

## 2021-03-23 NOTE — Telephone Encounter (Signed)
Medication Refill - Medication: lasix  Has the patient contacted their pharmacy? yes (Agent: If no, request that the patient contact the pharmacy for the refill.) (Agent: If yes, when and what did the pharmacy advise?)contact pcp  Preferred Pharmacy (with phone number or street name):  [place], [place] Phone:  [phone]  Fax:  [phone]      Agent: Please be advised that RX refills may take up to 3 business days. We ask that you follow-up with your pharmacy.

## 2021-03-23 NOTE — Telephone Encounter (Signed)
   Notes to clinic:  medication filled by a different provider  Review for refill    Requested Prescriptions  Pending Prescriptions Disp Refills   furosemide (LASIX) 40 MG tablet 30 tablet 0    Sig: Take 1 tablet (40 mg total) by mouth daily.      Cardiovascular:  Diuretics - Loop Passed - 03/23/2021 10:39 AM      Passed - K in normal range and within 360 days    Potassium  Date Value Ref Range Status  02/21/2021 4.2 3.5 - 5.1 mmol/L Final          Passed - Ca in normal range and within 360 days    Calcium  Date Value Ref Range Status  02/21/2021 9.2 8.9 - 10.3 mg/dL Final   Calcium, Ion  Date Value Ref Range Status  01/17/2011 1.15 1.12 - 1.32 mmol/L Final          Passed - Na in normal range and within 360 days    Sodium  Date Value Ref Range Status  02/21/2021 136 135 - 145 mmol/L Final  12/16/2019 138 134 - 144 mmol/L Final          Passed - Cr in normal range and within 360 days    Creat  Date Value Ref Range Status  03/29/2016 0.83 0.50 - 1.05 mg/dL Final    Comment:      For patients > or = 58 years of age: The upper reference limit for Creatinine is approximately 13% higher for people identified as African-American.      Creatinine, Ser  Date Value Ref Range Status  02/21/2021 0.65 0.44 - 1.00 mg/dL Final   Creatinine, Urine  Date Value Ref Range Status  02/10/2021 69.84 mg/dL Final          Passed - Last BP in normal range    BP Readings from Last 1 Encounters:  03/22/21 124/77          Passed - Valid encounter within last 6 months    Recent Outpatient Visits           Yesterday Type 2 diabetes mellitus with complication, with long-term current use of insulin (HCC)   [place], Charlane Ferretti, MD   1 year ago Acute ST elevation myocardial infarction (STEMI) due to occlusion of left anterior descending (LAD) coronary artery [facility])   [place], Charlane Ferretti, MD   4 years  ago Uncontrolled type 2 diabetes mellitus with diabetic nephropathy, without long-term current use of insulin ([place])   [place], [place], MD   4 years ago Uncontrolled type 2 diabetes mellitus with chronic kidney disease, unspecified CKD stage, unspecified long term insulin use status ([place])   [place], [place], MD   5 years ago Type 2 diabetes mellitus with other diabetic kidney complication   [place], Enobong, MD       Future Appointments             In 3 months Charlott Rakes, MD [place]

## 2021-03-24 MED ORDER — FUROSEMIDE 40 MG PO TABS: 40.0000 mg | ORAL_TABLET | Freq: Every day | ORAL | 2 refills | Status: DC

## 2021-03-24 NOTE — Telephone Encounter (Signed)
Pt also called to request refill for furosemide (LASIX) 40 MG tablet / please advise

## 2021-03-24 NOTE — Telephone Encounter (Signed)
   Notes to clinic:  2nd request Medication filled by a different provider  Review for refill   Requested Prescriptions  Pending Prescriptions Disp Refills   furosemide (LASIX) 40 MG tablet 30 tablet 0    Sig: Take 1 tablet (40 mg total) by mouth daily.      Cardiovascular:  Diuretics - Loop Passed - 03/24/2021 10:49 AM      Passed - K in normal range and within 360 days    Potassium  Date Value Ref Range Status  02/21/2021 4.2 3.5 - 5.1 mmol/L Final          Passed - Ca in normal range and within 360 days    Calcium  Date Value Ref Range Status  02/21/2021 9.2 8.9 - 10.3 mg/dL Final   Calcium, Ion  Date Value Ref Range Status  01/17/2011 1.15 1.12 - 1.32 mmol/L Final          Passed - Na in normal range and within 360 days    Sodium  Date Value Ref Range Status  02/21/2021 136 135 - 145 mmol/L Final  12/16/2019 138 134 - 144 mmol/L Final          Passed - Cr in normal range and within 360 days    Creat  Date Value Ref Range Status  03/29/2016 0.83 0.50 - 1.05 mg/dL Final    Comment:      For patients > or = 58 years of age: The upper reference limit for Creatinine is approximately 13% higher for people identified as African-American.      Creatinine, Ser  Date Value Ref Range Status  02/21/2021 0.65 0.44 - 1.00 mg/dL Final   Creatinine, Urine  Date Value Ref Range Status  02/10/2021 69.84 mg/dL Final          Passed - Last BP in normal range    BP Readings from Last 1 Encounters:  03/22/21 124/77          Passed - Valid encounter within last 6 months    Recent Outpatient Visits           2 days ago Type 2 diabetes mellitus with complication, with long-term current use of insulin ([place])   [place], Charlane Ferretti, MD   1 year ago Acute ST elevation myocardial infarction (STEMI) due to occlusion of left anterior descending (LAD) coronary artery [facility])   [place], Charlane Ferretti, MD    4 years ago Uncontrolled type 2 diabetes mellitus with diabetic nephropathy, without long-term current use of insulin ([place])   [place], [place], MD   4 years ago Uncontrolled type 2 diabetes mellitus with chronic kidney disease, unspecified CKD stage, unspecified long term insulin use status ([place])   [place], [place], MD   5 years ago Type 2 diabetes mellitus with other diabetic kidney complication   [place], Enobong, MD       Future Appointments             In 3 months Charlott Rakes, MD [place]

## 2021-03-29 ENCOUNTER — Institutional Professional Consult (permissible substitution): Payer: Medicaid Other | Admitting: Clinical

## 2021-03-30 ENCOUNTER — Telehealth (INDEPENDENT_AMBULATORY_CARE_PROVIDER_SITE_OTHER): Payer: Self-pay

## 2021-03-30 NOTE — Telephone Encounter (Signed)
Please contact patient. Nat Christen, CMA    Copied from [place] [phone]. Topic: Appointment Scheduling - Scheduling Inquiry for Clinic >> Mar 29, 2021  1:18 PM Lennox Solders wrote: Reason for CRM:  Pt is calling and would like phone call from asante mccoy instead of in person visit

## 2021-03-31 NOTE — Telephone Encounter (Signed)
Unaware that this appt was made virtual. I attempted to call pt to reschedule. Left vm.

## 2021-04-04 NOTE — Telephone Encounter (Signed)
Second attempt to reach pt, no answer left vm

## 2021-04-11 ENCOUNTER — Other Ambulatory Visit: Payer: Self-pay

## 2021-04-18 ENCOUNTER — Other Ambulatory Visit: Payer: Self-pay

## 2021-04-18 ENCOUNTER — Ambulatory Visit (INDEPENDENT_AMBULATORY_CARE_PROVIDER_SITE_OTHER): Payer: Self-pay | Admitting: Clinical

## 2021-04-18 DIAGNOSIS — F4323 Adjustment disorder with mixed anxiety and depressed mood: Secondary | ICD-10-CM

## 2021-04-19 NOTE — BH Specialist Note (Addendum)
Integrated Behavioral Health via Telemedicine Visit  04/19/2021 Jlah Balthazar [id number]  Number of [place] visits: 1/6 Session Start time: 10:15am  Session End time: 11:00am Total time: 45   Referring Provider: PCP [place] Patient/Family location: Home [facility] Provider location: [facility] All persons participating in visit: LCSWA and pt Types of Service: Individual psychotherapy and Video visit  I connected with [place] Enabled Telemedicine Application  (Video is Caregility application) and verified that I am speaking with the correct person using two identifiers. Discussed confidentiality: Yes   I discussed the limitations of telemedicine and the availability of in person appointments.  Discussed there is a possibility of technology failure and discussed alternative modes of communication if that failure occurs.  I discussed that engaging in this telemedicine visit, they consent to the provision of behavioral healthcare and the services will be billed under their insurance.  Patient and/or legal guardian expressed understanding and consented to Telemedicine visit: Yes   Presenting Concerns: Patient and/or family reports the following symptoms/concerns: Pt reports feeling depressed, decreased energy, self-esteem disturbances, difficulty concentrating, fidgeting, anxiousness, excessive worrying, difficulty relaxing, and restlessness. Reports experiencing feelings of guilt related to physical health. Reports that she blames herself for experiencing health problems as a result of not maintaining a healthy weight.  Duration of problem: 3 months; Severity of problem: mild  Patient and/or Family's Strengths/Protective Factors: Social and Emotional competence, Concrete supports in place (healthy food, safe environments, etc.), and Sense of purpose  Goals Addressed: Patient will:  Reduce symptoms of: anxiety and depression   Increase  knowledge and/or ability of: coping skills and healthy habits   Demonstrate ability to: Increase healthy adjustment to current life circumstances  Progress towards Goals: Ongoing  Interventions: Interventions utilized:  Mindfulness or Psychologist, educational, CBT Cognitive Behavioral Therapy, Supportive Counseling, and Psychoeducation and/or Health Education Standardized Assessments completed: C-SSRS Short, GAD-7, and PHQ 9  Patient and/or Family Response: Pt receptive tx. Pt receptive to psychoeducation provided on the association of physical health and mental health. Pt receptive to utilizing deep breathing exercises. Pt agreeable to attempting positive affirmations. Pt receptive to cognitive restructuring. Pt currently adhering to prozac prescribed from PCP.   Assessment: Pt endorses a hx of SI when being in the hospital for physical health in June but denies experiencing SI since hospitalization. No HI. Denies visual/auditory hallucinations. Patient currently experiencing an adjustment reaction to physical health. Pt appears to have a decreased self-esteem as a result of physical health. Pt identified healthy support system with her ex-husband assisting with finances and receiving a disability check for nephew who she has custody of.  Pt appears to experience anxiety and depression related to physical health.   Patient may benefit from brief therapy and continued medication managment. Pt may benefit from cognitive restructuring and positive affirmations. LCSWA encouraged pt to utilize deep breathing exercises and positive affirmations. Pt also mentioned concerns with wanting to appeal medicaid. Pt will be referred to legal aid to assist with appeal. LCSWA will fu with pt.   Plan: Follow up with behavioral health clinician on : 05/02/21 Behavioral recommendations: Utilize positive affirmations, utilize deep breathing exercises, and continue adhering to medication Referral(s): [place] (In Clinic)  I discussed the assessment and treatment plan with the patient and/or parent/guardian. They were provided an opportunity to ask questions and all were answered. They agreed with the plan and demonstrated an understanding of the instructions.   They were advised to call back or  seek an in-person evaluation if the symptoms worsen or if the condition fails to improve as anticipated.  Kalik Hoare C Breslin Burklow, LCSW

## 2021-04-20 ENCOUNTER — Telehealth: Payer: Self-pay

## 2021-04-20 NOTE — Telephone Encounter (Signed)
Following up on a request to assist the with patient with a Legal Aid referral. LVM for the patient to return a phone call to [facility] on Monday.

## 2021-05-01 ENCOUNTER — Other Ambulatory Visit (HOSPITAL_COMMUNITY): Payer: Self-pay

## 2021-05-02 ENCOUNTER — Ambulatory Visit (INDEPENDENT_AMBULATORY_CARE_PROVIDER_SITE_OTHER): Payer: Self-pay | Admitting: Clinical

## 2021-05-02 ENCOUNTER — Other Ambulatory Visit: Payer: Self-pay

## 2021-05-02 DIAGNOSIS — F4323 Adjustment disorder with mixed anxiety and depressed mood: Secondary | ICD-10-CM

## 2021-05-04 NOTE — BH Specialist Note (Signed)
Integrated Behavioral Health via Telemedicine Visit  05/04/2021 Brenda [name] [id number]  Number of [place] visits: 2/6 Session Start time: 11:05amSession End time: 11:40am Total time: 35   Referring Provider: PCP [place] Patient/Family location: Home [facility] Provider location: [facility] All persons participating in visit: LCSWA and pt Types of Service: Individual psychotherapy and Video visit  I connected with Brenda [name] via [place] Enabled Telemedicine Application  (Video is Caregility application) and verified that I am speaking with the correct person using two identifiers. Discussed confidentiality: Yes   I discussed the limitations of telemedicine and the availability of in person appointments.  Discussed there is a possibility of technology failure and discussed alternative modes of communication if that failure occurs.  I discussed that engaging in this telemedicine visit, they consent to the provision of behavioral healthcare and the services will be billed under their insurance.  Patient and/or legal guardian expressed understanding and consented to Telemedicine visit: Yes   Presenting Concerns: Patient and/or family reports the following symptoms/concerns: Pt reports feeling depressed at times and believes her mood has improved since last session. Reports continued self-esteem disturbances, decreased energy, anxiousness, and worrying. Reports that she still experiences feelings guilt related to physical health. Reports that she is utilizing more positive affirmations to improve self-esteem.   Duration of problem: 3 months; Severity of problem: mild  Patient and/or Family's Strengths/Protective Factors: Social and Emotional competence, Concrete supports in place (healthy food, safe environments, etc.), and Sense of purpose  Goals Addressed: Patient will:  Reduce symptoms of: agitation and depression   Increase knowledge and/or  ability of: coping skills and healthy habits   Demonstrate ability to: Increase healthy adjustment to current life circumstances  Progress towards Goals: Ongoing  Interventions: Interventions utilized:  Mindfulness or Psychologist, educational, Behavioral Activation, and Supportive Counseling Standardized Assessments completed: GAD-7 and PHQ 9  Patient and/or Family Response: Pt receptive to tx. Pt receptive to psychoeducation provided on anxiety and depression. Pt receptive to affirmations provided on pt's progress. Pt receptive to cognitive restructuring utilized to decrease pt worries and improve pt's self esteem. Pt currently adhering to Prozac prescribed from PCP.   Assessment: Denies SI/HI. Denies auditory/visual hallucinations. No safety risks. Patient currently experiencing an adjustment reaction to physical health. Pt depressive mood appears to have improved as she spent an increased amount of time with family. [name] anxiety is improving as she continues to utilize positive affirmations to decrease pt worries. Pt is also doing exercises to improve physical health. Pt appears to still deal with guilt however is progressing as she is incorporating healthy coping skills daily.   Patient may benefit from ongoing brief therapy and continued medication management. Pt would also benefit from continuing to incorporate positive affirmations. LCSWA provided affirmations for pt's progress. LCSWA utilized cognitive restructruing to decrease pt worries. LCSWA encouraged pt to continue positive affirmations, exercises for physical health, deep breathing exercises, and spending time with family. LCSWA will fu with pt.  Plan: Follow up with behavioral health clinician on : 05/23/21 Behavioral recommendations: Utilize positive affirmations, continue exercising, utilize deep breathing exercises, continue adhering to medication, and continue spending time with family. Referral(s): [place] (In Clinic)  I discussed the assessment and treatment plan with the patient and/or parent/guardian. They were provided an opportunity to ask questions and all were answered. They agreed with the plan and demonstrated an understanding of the instructions.   They were advised to call back or seek an in-person evaluation  if the symptoms worsen or if the condition fails to improve as anticipated.  Brenda [name] C Ayani Ospina, LCSW

## 2021-05-23 ENCOUNTER — Other Ambulatory Visit: Payer: Self-pay

## 2021-05-23 ENCOUNTER — Ambulatory Visit (INDEPENDENT_AMBULATORY_CARE_PROVIDER_SITE_OTHER): Payer: Self-pay | Admitting: Clinical

## 2021-05-23 DIAGNOSIS — F4323 Adjustment disorder with mixed anxiety and depressed mood: Secondary | ICD-10-CM

## 2021-05-24 NOTE — BH Specialist Note (Signed)
Integrated Behavioral Health via Telemedicine Visit  05/24/2021 Brenda [name] [id number]  Number of [place] visits: 3/6 Session Start time: 11:00am  Session End time: 11:30am Total time: 30  Referring Provider: PCP Charlott Rakes, MD  Patient/Family location: Home [facility] Provider location: [facility] All persons participating in visit: Pt and LCSWA Types of Service: Individual psychotherapy and Video visit  I connected with Tobie Poet via [place] Enabled Telemedicine Application  (Video is Caregility application) and verified that I am speaking with the correct person using two identifiers. Discussed confidentiality: Yes   I discussed the limitations of telemedicine and the availability of in person appointments.  Discussed there is a possibility of technology failure and discussed alternative modes of communication if that failure occurs.  I discussed that engaging in this telemedicine visit, they consent to the provision of behavioral healthcare and the services will be billed under their insurance.  Patient and/or legal guardian expressed understanding and consented to Telemedicine visit: Yes   Presenting Concerns: Patient and/or family reports the following symptoms/concerns: Reports feeling depressed at times, low energy, self-esteem disturbances, anxiousness, and worrying. Reports feeling alone at times. Reports that she wants to create friends as she primarily talks to her nephew who she cares for. Reports that her mood has improved and she believes growing her social will assist more.   Duration of problem: 4 months; Severity of problem: mild  Patient and/or Family's Strengths/Protective Factors: Social and Emotional competence, Concrete supports in place (healthy food, safe environments, etc.), and Sense of purpose  Goals Addressed: Patient will:  Reduce symptoms of: anxiety and depression   Increase knowledge and/or ability of:  coping skills and healthy habits   Demonstrate ability to: Increase healthy adjustment to current life circumstances  Progress towards Goals: Ongoing  Interventions: Interventions utilized:  CBT Cognitive Behavioral Therapy and Supportive Counseling Standardized Assessments completed: GAD-7 and PHQ 9 PHQ9 SCORE ONLY 05/23/2021 05/02/2021 04/18/2021  PHQ-9 Total Score 4 9 14     GAD 7 : Generalized Anxiety Score 05/23/2021 05/02/2021 04/18/2021 03/22/2021  Nervous, Anxious, on Edge 1 1 2 2   Control/stop worrying 1 1 2 2   Worry too much - different things 1 1 1 1   Trouble relaxing 0 1 1 2   Restless 0 1 2 1   Easily annoyed or irritable 0 1 1 1   Afraid - awful might happen 1 2 2 2   Total GAD 7 Score 4 8 11 11    Patient and/or Family Response: Pt receptive to tx. Pt receptive to psychoeducation provided on anxiety and socializing. Pt receptive to cognitive restructuring utilized to decrease pt worries. Pt receptive to affirmations provided on pt's worries. Pt continues to adhere to medication and utilize positive affirmations. Pt will increase socializing by going back to church in person.  Assessment: Denies SI/HI. Denies auditory/visual hallucinations. No safety risks. Patient currently experiencing mild anxiety and depression. [name] mood appears to have improved and anxiety has decreased. [name] cognitive processing skills have improved. Pt continues to do exercises, positive affirmations, and adheres to medication. Pt continues to have difficulty with self-esteem. Pt wants to establish a social life and has difficulty with worrying how she will be perceived.   Patient may benefit from ongoing brief therapy and continued medication adherence. Pt would benefit from socializing as it can further improve her mood. LCSWA provided psychoeducation on anxiety and socializing. LCSWA utilized cognitive restructuring to decrease pt worries. Pt plans to begin returning to church in person in order to increase  socializing. LCSWA encouraged pt to utilize deep breathing exercises. LCSWA will fu with pt.  Plan: Follow up with behavioral health clinician on : 06/13/21 Behavioral recommendations: Utilize positive affirmations, continue exercising, utilize deep breathing exercises, and begin socializing when returning to church in person. Referral(s): [place] (In Clinic)  I discussed the assessment and treatment plan with the patient and/or parent/guardian. They were provided an opportunity to ask questions and all were answered. They agreed with the plan and demonstrated an understanding of the instructions.   They were advised to call back or seek an in-person evaluation if the symptoms worsen or if the condition fails to improve as anticipated.  Joud Pettinato C Brynnleigh Mcelwee, LCSW

## 2021-06-13 ENCOUNTER — Other Ambulatory Visit: Payer: Self-pay

## 2021-06-13 ENCOUNTER — Ambulatory Visit (INDEPENDENT_AMBULATORY_CARE_PROVIDER_SITE_OTHER): Payer: Self-pay | Admitting: Clinical

## 2021-06-13 DIAGNOSIS — F4323 Adjustment disorder with mixed anxiety and depressed mood: Secondary | ICD-10-CM

## 2021-06-18 NOTE — BH Specialist Note (Signed)
Integrated Behavioral Health via Telemedicine Visit  06/18/2021 Brenda [name] [id number]  Number of [place] visits: 4/6 Session Start time: 11:10am  Session End time: 11:40am Total time: 30  Referring Provider: PCP Charlott Rakes, MD Patient/Family location: Home [facility] Provider location: [facility] All persons participating in visit: pt and LCSWA Types of Service: Individual psychotherapy  I connected with [place] Enabled Telemedicine Application  (Video is Caregility application) and verified that I am speaking with the correct person using two identifiers. Discussed confidentiality: Yes   I discussed the limitations of telemedicine and the availability of in person appointments.  Discussed there is a possibility of technology failure and discussed alternative modes of communication if that failure occurs.  I discussed that engaging in this telemedicine visit, they consent to the provision of behavioral healthcare and the services will be billed under their insurance.  Patient and/or legal guardian expressed understanding and consented to Telemedicine visit: Yes   Presenting Concerns: Patient and/or family reports the following symptoms/concerns: Reports low interest in activities, feeling depressed at times but has noticed improvements, self-esteem disturbances, anxiousness, worrying, trouble relaxing, and restlessness. Reports that she worries about her nephew at times. Reports that she is trying to grow her social life and has attended church twice and made new friends. Duration of problem: 4 months; Severity of problem: mild mild Patient and/or Family's Strengths/Protective Factors: Social connections, Social and Emotional competence, Concrete supports in place (healthy food, safe environments, etc.), and Sense of purpose  Goals Addressed: Patient will:  Reduce symptoms of: anxiety and depression   Increase knowledge and/or ability  of: coping skills and healthy habits   Demonstrate ability to: Increase healthy adjustment to current life circumstances  Progress towards Goals: Ongoing  Interventions: Interventions utilized:  Mindfulness or Psychologist, educational, CBT Cognitive Behavioral Therapy, and Supportive Counseling Standardized Assessments completed: GAD-7 and PHQ 9 GAD 7 : Generalized Anxiety Score 06/13/2021 05/23/2021 05/02/2021 04/18/2021  Nervous, Anxious, on Edge 1 1 1 2   Control/stop worrying 1 1 1 2   Worry too much - different things 2 1 1 1   Trouble relaxing 1 0 1 1  Restless 1 0 1 2  Easily annoyed or irritable 0 0 1 1  Afraid - awful might happen 1 1 2 2   Total GAD 7 Score 7 4 8 11      Depression screen [facility] 2/9 06/13/2021 05/23/2021 05/02/2021 04/18/2021 03/22/2021  Decreased Interest 1 0 1 1 2   Down, Depressed, Hopeless 1 1 1 2 2   PHQ - 2 Score 2 1 2 3 4   Altered sleeping 0 0 1 1 2   Tired, decreased energy 0 1 2 2 3   Change in appetite 1 1 1 2 2   Feeling bad or failure about yourself  1 1 1 2 2   Trouble concentrating 0 0 1 1 2   Moving slowly or fidgety/restless 0 0 1 2 1   Suicidal thoughts 0 0 0 1 1  PHQ-9 Score 4 4 9 14 17   Difficult doing work/chores - - - - -  Some recent data might be hidden    Patient and/or Family Response: Pt receptive to tx. Pt receptive to psychoeducation provided on anxiety, depression, and the benefits of socializing. Pt receptive to cognitive restructuring utilized to decrease pt worries. Pt receptive to continuing to adhere to medication, positive affirmations, socializing, and deep breathing exercises.   Assessment: Denies SI/HI. Denies auditory/visual hallucinations. No safety risks. Patient currently experiencing mild anxiety and depression. [name]  mood and anxiety appears to have improved. [name] cognitive processing continues to improve as she is socializing more. Pt continues to worry about her nephew.   Patient may benefit from ongoing brief therapy and continuing  socialization. LCSWA provided psychoeducation on depression, anxiety, and the benefits of socializing. LCSWA provided affirmation for pt's progress. LCSWA utilized cognitive restructuring to decrease pt worries. LCSWA encouraged pt to continue adhering to medication, positive affirmations, socializing, and deep breathing exercises. LCSWA will fu with pt.  Plan: Follow up with behavioral health clinician on : 07/11/21 Behavioral recommendations: Utilize deep breathing exercises, continue positive affirmations, socializing, and adhering to medication Referral(s): [place] (In Clinic)  I discussed the assessment and treatment plan with the patient and/or parent/guardian. They were provided an opportunity to ask questions and all were answered. They agreed with the plan and demonstrated an understanding of the instructions.   They were advised to call back or seek an in-person evaluation if the symptoms worsen or if the condition fails to improve as anticipated.  Muad [place] C Fleetwood Pierron, LCSW

## 2021-06-26 ENCOUNTER — Other Ambulatory Visit: Payer: Self-pay

## 2021-06-26 ENCOUNTER — Ambulatory Visit: Payer: Self-pay | Attending: Family Medicine | Admitting: Family Medicine

## 2021-06-26 VITALS — BP 150/84 | HR 104 | Ht 61.0 in | Wt 296.6 lb

## 2021-06-26 DIAGNOSIS — Z794 Long term (current) use of insulin: Secondary | ICD-10-CM

## 2021-06-26 DIAGNOSIS — F419 Anxiety disorder, unspecified: Secondary | ICD-10-CM

## 2021-06-26 DIAGNOSIS — Z1211 Encounter for screening for malignant neoplasm of colon: Secondary | ICD-10-CM

## 2021-06-26 DIAGNOSIS — E1142 Type 2 diabetes mellitus with diabetic polyneuropathy: Secondary | ICD-10-CM

## 2021-06-26 DIAGNOSIS — E118 Type 2 diabetes mellitus with unspecified complications: Secondary | ICD-10-CM

## 2021-06-26 DIAGNOSIS — Z1159 Encounter for screening for other viral diseases: Secondary | ICD-10-CM

## 2021-06-26 DIAGNOSIS — F32A Depression, unspecified: Secondary | ICD-10-CM

## 2021-06-26 DIAGNOSIS — I5032 Chronic diastolic (congestive) heart failure: Secondary | ICD-10-CM

## 2021-06-26 DIAGNOSIS — J9611 Chronic respiratory failure with hypoxia: Secondary | ICD-10-CM

## 2021-06-26 DIAGNOSIS — Z23 Encounter for immunization: Secondary | ICD-10-CM

## 2021-06-26 DIAGNOSIS — N939 Abnormal uterine and vaginal bleeding, unspecified: Secondary | ICD-10-CM

## 2021-06-26 LAB — POCT GLYCOSYLATED HEMOGLOBIN (HGB A1C): HbA1c, POC (controlled diabetic range): 8.6 % — AB (ref 0.0–7.0)

## 2021-06-26 LAB — GLUCOSE, POCT (MANUAL RESULT ENTRY): POC Glucose: 84 mg/dl (ref 70–99)

## 2021-06-26 MED ORDER — GABAPENTIN 300 MG PO CAPS
300.0000 mg | ORAL_CAPSULE | Freq: Every day | ORAL | 3 refills | Status: DC
Start: 2021-06-26 — End: 2021-10-03

## 2021-06-26 MED ORDER — FLUOXETINE HCL 20 MG PO CAPS: 20.0000 mg | ORAL_CAPSULE | Freq: Every day | ORAL | 6 refills | Status: DC

## 2021-06-26 MED ORDER — FUROSEMIDE 40 MG PO TABS: 40.0000 mg | ORAL_TABLET | Freq: Every day | ORAL | 6 refills | Status: DC

## 2021-06-26 MED ORDER — TRULICITY 0.75 MG/0.5ML ~~LOC~~ SOAJ
0.7500 mg | SUBCUTANEOUS | 6 refills | Status: DC
  Filled 2021-06-26: qty 2, 28d supply, fill #0
  Filled 2021-08-04: qty 2, 28d supply, fill #1
  Filled 2021-09-12: qty 2, 28d supply, fill #0

## 2021-06-26 NOTE — Progress Notes (Signed)
Subjective:  Patient ID: Brenda [name], female    DOB: 1960/10/20  Age: 60 y.o. MRN: [id number]  CC: Diabetes   HPI Brenda [name] is a 60 y.o. year old female with a history of type 2 diabetes mellitus (A1c 8.6, obstructive sleep apnea (on CPAP), hypertension, CAD status post multiple stent, STEMI in 10/2019,  HFpEF (EF 60-65%), chronic respiratory failure on 2L oxygen.  Interval History: She has been on her periods for the last 'one and a half years' with few days in between without periods. This has been associated with passage of clots and she is changing her pads every half hour. She has also having to take iron supplements intermittently.States she has PCOS and prior to 1.5 years ago she had not had periods for 10 years. Today she is spotting  She feels dyspneic on mild exertion.  Compliant with her oxygen at rest and also with her medications.  Denies pedal edema, chest pain or weight gain.  A1c is 8.6 down from 11.7 and she has been compliant with her diabetes medications.  Denies presence of hypoglycemia or visual concerns. Complains of burning at night and tingling in her feet. Past Medical History:  Diagnosis Date   Arthritis    "back, hands, hips" (02/22/2015)   CAD (coronary artery disease)    a. complex LAD/diagonal bifurcation PCI in 2010. b. STEMI 10/2019 s/p PTCA/DES x1 to mLAD overlapping the old stent, residual disease treated medicaly.   Depression    Diabetes mellitus ([place])    "started when I was pregnant; not sure if it was type 1 or type 2"    Hypercholesterolemia    Hypertension    Morbid obesity ([place])    Sleep apnea    "suppose to wear mask; I don't" (02/22/2015)    Past Surgical History:  Procedure Laterality Date   CARDIAC CATHETERIZATION  12/2012   CHOLECYSTECTOMY OPEN  1989   CORONARY ANGIOPLASTY WITH STENT PLACEMENT  2011   CORONARY/GRAFT ACUTE MI REVASCULARIZATION N/A 10/26/2019   Procedure: CORONARY/GRAFT ACUTE MI REVASCULARIZATION;   Surgeon: Burnell Blanks, MD;  Location: [place] [facility];  Service: Cardiovascular;  Laterality: N/A;   HERNIA REPAIR     LEFT HEART CATH AND CORONARY ANGIOGRAPHY N/A 10/26/2019   Procedure: LEFT HEART CATH AND CORONARY ANGIOGRAPHY;  Surgeon: Burnell Blanks, MD;  Location: [place] [facility];  Service: Cardiovascular;  Laterality: N/A;   LEFT HEART CATHETERIZATION WITH CORONARY ANGIOGRAM N/A 12/25/2012   Procedure: LEFT HEART CATHETERIZATION WITH CORONARY ANGIOGRAM;  Surgeon: Sherren Mocha, MD;  Location: [facility] CATH LAB;  Service: Cardiovascular;  Laterality: N/A;   UMBILICAL HERNIA REPAIR  74's    Family History  Problem Relation Age of Onset   Heart attack Father        in his mid 89s   COPD Father    Coronary artery disease Mother        in her mid 81s   Hypertension Mother    Cancer Mother    Diabetes Sister    Coronary artery disease Sister        valve replacement   Stroke Sister        multiple    Allergies  Allergen Reactions   Hydrocodone Shortness Of Breath    "I forget to breathe."    Clopidogrel Bisulfate     REACTION: rash   Hydrocodeine [Dihydrocodeine]     Outpatient Medications Prior to Visit  Medication Sig Dispense Refill   aspirin  81 MG EC tablet Take 1 tablet (81 mg total) by mouth daily. 30 tablet 11   atorvastatin (LIPITOR) 80 MG tablet Take 1 tablet (80 mg total) by mouth every evening. 30 tablet 6   Blood Glucose Monitoring Suppl (TRUE METRIX METER) DEVI 1 each by Does not apply route 3 (three) times daily. 1 Device 0   empagliflozin (JARDIANCE) 10 MG TABS tablet Take 1 tablet (10 mg total) by mouth daily. 30 tablet 3   FLUoxetine (PROZAC) 20 MG capsule Take 1 capsule (20 mg total) by mouth daily. 30 capsule 3   furosemide (LASIX) 40 MG tablet Take 1 tablet (40 mg total) by mouth daily. 30 tablet 2   glucose blood (TRUE METRIX BLOOD GLUCOSE TEST) test strip Use as instructed 100 each 5   insulin NPH Human (HUMULIN N) 100 UNIT/ML  injection Inject 0.22 mLs (22 Units total) into the skin 2 (two) times daily before a meal. 10 mL 11   insulin regular (NOVOLIN R) 100 units/mL injection Inject 0.15 mLs (15 Units total) into the skin 3 (three) times daily before meals. 30 mL 0   nitroGLYCERIN (NITROSTAT) 0.4 MG SL tablet Place 1 tablet (0.4 mg total) under the tongue every 5 (five) minutes as needed for chest pain (up to 3 doses. If taking 3rd dose call 911). 25 tablet 3   prasugrel (EFFIENT) 10 MG TABS tablet Take 1 tablet (10 mg total) by mouth daily. Pt needs to make appt with provider for further refills - 1st attempt 30 tablet 1   sacubitril-valsartan (ENTRESTO) 24-26 MG Take 1 tablet by mouth 2 (two) times daily. 60 tablet 11   TRUEPLUS LANCETS 30G MISC 1 each by Does not apply route 3 (three) times daily. 100 each 5   Insulin Pen Needle (NOVOFINE) 30G X 8 MM MISC Use as directed with Novolin 70/30 insulin (Patient not taking: Reported on 06/26/2021) 100 each 1   Insulin Syringe-Needle U-100 30G X 1/2" 0.3 ML MISC use as directed 5 (five) times daily with insulin (Patient not taking: Reported on 06/26/2021) 150 each 2   No facility-administered medications prior to visit.     ROS Review of Systems  Constitutional:  Negative for activity change, appetite change and fatigue.  HENT:  Negative for congestion, sinus pressure and sore throat.   Eyes:  Negative for visual disturbance.  Respiratory:  Positive for shortness of breath. Negative for cough, chest tightness and wheezing.   Cardiovascular:  Negative for chest pain and palpitations.  Gastrointestinal:  Negative for abdominal distention, abdominal pain and constipation.  Endocrine: Negative for polydipsia.  Genitourinary:  Negative for dysuria and frequency.  Musculoskeletal:  Negative for arthralgias and back pain.  Skin:  Negative for rash.  Neurological:  Positive for numbness. Negative for tremors and light-headedness.  Hematological:  Does not bruise/bleed  easily.  Psychiatric/Behavioral:  Negative for agitation and behavioral problems.    Objective:  BP (!) 150/84   Pulse (!) 104   Ht 5' 1"  (1.549 m)   Wt 296 lb 9.6 oz (134.5 kg)   SpO2 99%   BMI 56.04 kg/m   BP/Weight 06/26/2021 03/22/2021 4/31/5400  Systolic BP 867 619 509  Diastolic BP 84 77 89  Wt. (Lbs) 296.6 314 341  BMI 56.04 59.33 64.43      Physical Exam Constitutional:      Appearance: She is well-developed. She is obese.  HENT:     Head:     Comments: 2 L of oxygen at  rest via nasal cannula    Nose:     Comments: On 2 L of oxygen via nasal cannula Cardiovascular:     Rate and Rhythm: Tachycardia present.     Heart sounds: Normal heart sounds. No murmur heard. Pulmonary:     Effort: Pulmonary effort is normal.     Breath sounds: Normal breath sounds. No wheezing or rales.  Chest:     Chest wall: No tenderness.  Abdominal:     General: Bowel sounds are normal. There is no distension.     Palpations: Abdomen is soft. There is no mass.     Tenderness: There is no abdominal tenderness.  Musculoskeletal:        General: Normal range of motion.     Right lower leg: No edema.     Left lower leg: No edema.  Neurological:     Mental Status: She is alert and oriented to person, place, and time.  Psychiatric:        Mood and Affect: Mood normal.    CMP Latest Ref Rng & Units 02/21/2021 02/14/2021 02/13/2021  Glucose 70 - 99 mg/dL 93 92 229(H)  BUN 6 - 20 mg/dL 8 8 12   Creatinine 0.44 - 1.00 mg/dL 0.65 0.77 0.97  Sodium 135 - 145 mmol/L 136 135 133(L)  Potassium 3.5 - 5.1 mmol/L 4.2 3.7 3.9  Chloride 98 - 111 mmol/L 95(L) 94(L) 92(L)  CO2 22 - 32 mmol/L 32 32 32  Calcium 8.9 - 10.3 mg/dL 9.2 8.9 8.4(L)  Total Protein 6.5 - 8.1 g/dL - - -  Total Bilirubin 0.3 - 1.2 mg/dL - - -  Alkaline Phos 38 - 126 U/L - - -  AST 15 - 41 U/L - - -  ALT 0 - 44 U/L - - -    Lipid Panel     Component Value Date/Time   CHOL 111 12/16/2019 1041   TRIG 80 12/16/2019 1041    HDL 50 12/16/2019 1041   CHOLHDL 2.2 12/16/2019 1041   CHOLHDL 3.5 10/27/2019 0219   VLDL 21 10/27/2019 0219   LDLCALC 45 12/16/2019 1041   LDLDIRECT 135.9 02/22/2011 1132    CBC    Component Value Date/Time   WBC 9.8 02/14/2021 0437   RBC 4.32 02/14/2021 0437   HGB 9.0 (L) 02/14/2021 0437   HCT 32.5 (L) 02/14/2021 0437   PLT 246 02/14/2021 0437   MCV 75.2 (L) 02/14/2021 0437   MCH 20.8 (L) 02/14/2021 0437   MCHC 27.7 (L) 02/14/2021 0437   RDW 22.7 (H) 02/14/2021 0437   LYMPHSABS 1.1 02/08/2021 1642   MONOABS 0.5 02/08/2021 1642   EOSABS 0.2 02/08/2021 1642   BASOSABS 0.1 02/08/2021 1642   Lab Results  Component Value Date   HGBA1C 8.6 (A) 06/26/2021      Assessment & Plan:  1. Type 2 diabetes mellitus with complication, with long-term current use of insulin (HCC) Uncontrolled with A1c of 8.6; goal is less than 7.0 Will initiate Trulicity with plans to titrate every month according to blood sugars Counseled on Diabetic diet, my plate method, 536 minutes of moderate intensity exercise/week Blood sugar logs with fasting goals of 80-120 mg/dl, random of less than 180 and in the event of sugars less than 60 mg/dl or greater than 400 mg/dl encouraged to notify the clinic. Advised on the need for annual eye exams, annual foot exams, Pneumonia vaccine. - POCT glucose (manual entry) - POCT glycosylated hemoglobin (Hb A1C) - Dulaglutide (TRULICITY) 4.68 EH/2.1YY  SOPN; Inject 0.75 mg into the skin once a week.  Dispense: 2 mL; Refill: 6 - CMP14+EGFR  2. Screening for viral disease - HCV Ab w Reflex to Quant PCR - HIV Antibody (routine testing w rflx) - Interpretation:  3. Abnormal uterine bleeding She will need a Pap smear but this has been scheduled for her next visit as she is currently bleeding - US Pelvic Complete With Transvaginal; Future  4. Anxiety and depression Controlled - FLUoxetine (PROZAC) 20 MG capsule; Take 1 capsule (20 mg total) by mouth daily.  Dispense:  30 capsule; Refill: 6  5. Chronic diastolic heart failure (HCC) NYHA III Euvolemic with EF of 60 to 65% from echo of 02/2021 - furosemide (LASIX) 40 MG tablet; Take 1 tablet (40 mg total) by mouth daily.  Dispense: 30 tablet; Refill: 6  6. Chronic respiratory failure with hypoxia (HCC) Continue on 2 L of oxygen  7. Need for immunization against influenza - Flu Vaccine QUAD 31moIM (Fluarix, Fluzone & Alfiuria Quad PF)  8. Diabetic polyneuropathy associated with type 2 diabetes mellitus ([place] Uncontrolled Will initiate gabapentin - gabapentin (NEURONTIN) 300 MG capsule; Take 1 capsule (300 mg total) by mouth at bedtime.  Dispense: 30 capsule; Refill: 3   Health Care Maintenance: Complete physical exam at next visit No orders of the defined types were placed in this encounter.    49 minutes of total face to face time spent including median intraservice time reviewing previous notes and test results, counseling patient on diagnosis of abnormal uterine bleed in addition to chronic medical conditions.Time also spent ordering medications, investigations and documenting in the chart.  All questions were answered to the patient's satisfaction    ECharlott Rakes MD, FAAFP. CMile [facility] [place]  06/26/2021, 3:31 PM

## 2021-06-26 NOTE — Patient Instructions (Signed)
Dulaglutide Injection What is this medication? DULAGLUTIDE (DOO la GLOO tide) treats type 2 diabetes. It works by increasing insulin levels in your body, which decreases your blood sugar (glucose). It also reduces the amount of sugar released into your blood and slows down your digestion. It can also be used to lower the risk of heart attack and stroke in people with type 2 diabetes. Changes to diet and exercise are often combined with this medication. This medicine may be used for other purposes; ask your health care provider or pharmacist if you have questions. COMMON BRAND NAME(S): Trulicity What should I tell my care team before I take this medication? They need to know if you have any of these conditions: Endocrine tumors (MEN 2) or if someone in your family had these tumors Eye disease, vision problems History of pancreatitis Kidney disease Liver disease Stomach or intestine problems Thyroid cancer or if someone in your family had thyroid cancer An unusual or allergic reaction to dulaglutide, other medications, foods, dyes, or preservatives Pregnant or trying to get pregnant Breast-feeding How should I use this medication? This medication is injected under the skin. You will be taught how to prepare and give it. Take it as directed on the prescription label on the same day of each week. Do NOT prime the pen. Keep taking it unless your care team tells you to stop. If you use this medication with insulin, you should inject this medication and the insulin separately. Do not mix them together. Do not give the injections right next to each other. Change (rotate) injection sites with each injection. This medication comes with INSTRUCTIONS FOR USE. Ask your pharmacist for directions on how to use this medication. Read the information carefully. Talk to your pharmacist or care team if you have questions. It is important that you put your used needles and syringes in a special sharps container. Do  not put them in a trash can. If you do not have a sharps container, call your pharmacist or care team to get one. A special MedGuide will be given to you by the pharmacist with each prescription and refill. Be sure to read this information carefully each time. Talk to your care team about the use of this medication in children. Special care may be needed. Overdosage: If you think you have taken too much of this medicine contact a poison control center or emergency room at once. NOTE: This medicine is only for you. Do not share this medicine with others. What if I miss a dose? If you miss a dose, take it as soon as you can unless it is more than 3 days late. If it is more than 3 days late, skip the missed dose. Take the next dose at the normal time. What may interact with this medication? Other medications for diabetes Many medications may cause changes in blood sugar, these include: Alcohol containing beverages Antiviral medications for HIV or AIDS Aspirin and aspirin-like medications Certain medications for blood pressure, heart disease, irregular heart beat Chromium Diuretics Female hormones, such as estrogens or progestins, birth control pills Fenofibrate Gemfibrozil Isoniazid Lanreotide Female hormones or anabolic steroids MAOIs like Carbex, Eldepryl, Marplan, Nardil, and Parnate Medications for allergies, asthma, cold, or cough Medications for depression, anxiety, or psychotic disturbances Medications for weight loss Niacin Nicotine NSAIDs, medications for pain and inflammation, like ibuprofen or naproxen Octreotide Pasireotide Pentamidine Phenytoin Probenecid Quinolone antibiotics such as ciprofloxacin, levofloxacin, ofloxacin Some herbal dietary supplements Steroid medications such as prednisone or cortisone Sulfamethoxazole;   trimethoprim Thyroid hormones Some medications can hide the warning symptoms of low blood sugar (hypoglycemia). You may need to monitor your blood  sugar more closely if you are taking one of these medications. These include: Beta-blockers, often used for high blood pressure or heart problems (examples include atenolol, metoprolol, propranolol) Clonidine Guanethidine Reserpine This list may not describe all possible interactions. Give your health care provider a list of all the medicines, herbs, non-prescription drugs, or dietary supplements you use. Also tell them if you smoke, drink alcohol, or use illegal drugs. Some items may interact with your medicine. What should I watch for while using this medication? Visit your care team for regular checks on your progress. Check with your care team if you have severe diarrhea, nausea, and vomiting, or if you sweat a lot. The loss of too much body fluid may make it dangerous for you to take this medication. A test called the HbA1C (A1C) will be monitored. This is a simple blood test. It measures your blood sugar control over the last 2 to 3 months. You will receive this test every 3 to 6 months. Learn how to check your blood sugar. Learn the symptoms of low and high blood sugar and how to manage them. Always carry a quick-source of sugar with you in case you have symptoms of low blood sugar. Examples include hard sugar candy or glucose tablets. Make sure others know that you can choke if you eat or drink when you develop serious symptoms of low blood sugar, such as seizures or unconsciousness. Get medical help at once. Tell your care team if you have high blood sugar. You might need to change the dose of your medication. If you are sick or exercising more than usual, you may need to change the dose of your medication. Do not skip meals. Ask your care team if you should avoid alcohol. Many nonprescription cough and cold products contain sugar or alcohol. These can affect blood sugar. Pens should never be shared. Even if the needle is changed, sharing may result in passing of viruses like hepatitis or  HIV. Wear a medical ID bracelet or chain. Carry a card that describes your condition. List the medications and doses you take on the card. What side effects may I notice from receiving this medication? Side effects that you should report to your care team as soon as possible: Allergic reactions-skin rash, itching, hives, swelling of the face, lips, tongue, or throat Change in vision Dehydration-increased thirst, dry mouth, feeling faint or lightheaded, headache, dark yellow or brown urine Kidney injury-decrease in the amount of urine, swelling of the ankles, hands, or feet Pancreatitis-severe stomach pain that spreads to your back or gets worse after eating or when touched, fever, nausea, vomiting Thyroid cancer-new mass or lump in the neck, pain or trouble swallowing, trouble breathing, hoarseness Side effects that usually do not require medical attention (report to your care team if they continue or are bothersome): Diarrhea Loss of appetite Nausea Stomach pain Vomiting This list may not describe all possible side effects. Call your doctor for medical advice about side effects. You may report side effects to FDA at 1-800-FDA-1088. Where should I keep my medication? Keep out of the reach of children and pets. Refrigeration (preferred): Store unopened pens in a refrigerator between 2 and 8 degrees C (36 and 46 degrees F). Keep it in the original carton until you are ready to take it. Do not freeze or use if the medication has been frozen.   Protect from light. Get rid of any unused medication after the expiration date on the label. Room Temperature: The pen may be stored at room temperature below 30 degrees C (86 degrees F) for up to a total of 14 days if needed. Protect from light. Avoid exposure to extreme heat. If it is stored at room temperature, throw away any unused medication after 14 days or after it expires, whichever is first. To get rid of medications that are no longer needed or have  expired: Take the medication to a medication take-back program. Check with your pharmacy or law enforcement to find a location. If you cannot return the medication, ask your pharmacist or care team how to get rid of this medication safely. NOTE: This sheet is a summary. It may not cover all possible information. If you have questions about this medicine, talk to your doctor, pharmacist, or health care provider.  2022 Elsevier/Gold Standard (2020-10-17 14:13:14)  

## 2021-06-27 LAB — CMP14+EGFR
ALT: 13 IU/L (ref 0–32)
AST: 15 IU/L (ref 0–40)
Albumin/Globulin Ratio: 1.2 (ref 1.2–2.2)
Albumin: 4 g/dL (ref 3.8–4.9)
Alkaline Phosphatase: 133 IU/L — ABNORMAL HIGH (ref 44–121)
BUN/Creatinine Ratio: 12 (ref 9–23)
BUN: 12 mg/dL (ref 6–24)
Bilirubin Total: 0.4 mg/dL (ref 0.0–1.2)
CO2: 26 mmol/L (ref 20–29)
Calcium: 9.3 mg/dL (ref 8.7–10.2)
Chloride: 98 mmol/L (ref 96–106)
Creatinine, Ser: 1.04 mg/dL — ABNORMAL HIGH (ref 0.57–1.00)
Globulin, Total: 3.3 g/dL (ref 1.5–4.5)
Glucose: 82 mg/dL (ref 70–99)
Potassium: 4.6 mmol/L (ref 3.5–5.2)
Sodium: 141 mmol/L (ref 134–144)
Total Protein: 7.3 g/dL (ref 6.0–8.5)
eGFR: 62 mL/min/{1.73_m2} (ref >59)

## 2021-06-27 LAB — HCV INTERPRETATION

## 2021-06-27 LAB — HIV ANTIBODY (ROUTINE TESTING W REFLEX): HIV Screen 4th Generation wRfx: NONREACTIVE

## 2021-06-27 LAB — HCV AB W REFLEX TO QUANT PCR: HCV Ab: <0.1 s/co ratio (ref 0.0–0.9)

## 2021-07-04 ENCOUNTER — Telehealth: Payer: Self-pay

## 2021-07-04 NOTE — Telephone Encounter (Signed)
Patient name and DOB has been verified Patient was informed of lab results. Patient had no questions.  

## 2021-07-04 NOTE — Telephone Encounter (Signed)
-----   Message from Charlott Rakes, MD sent at 06/27/2021  1:16 PM EDT ----- Please inform the patient that labs are stable.

## 2021-07-11 ENCOUNTER — Other Ambulatory Visit: Payer: Self-pay

## 2021-07-11 ENCOUNTER — Ambulatory Visit (INDEPENDENT_AMBULATORY_CARE_PROVIDER_SITE_OTHER): Payer: Self-pay | Admitting: Clinical

## 2021-07-11 DIAGNOSIS — F4323 Adjustment disorder with mixed anxiety and depressed mood: Secondary | ICD-10-CM

## 2021-07-11 NOTE — BH Specialist Note (Signed)
Integrated Behavioral Health via Telemedicine Visit  07/11/2021 Brenda [name] [id number]  Number of [place] visits: 5/6 Session Start time: 11:30am  Session End time: 12:00pm Total time: 30  Referring Provider: PCP Charlott Rakes, MD Patient/Family location: Home [facility] Provider location: [name] All persons participating in visit: Pt and LCSWA Types of Service: Individual psychotherapy and Video visit  I connected with Tobie Poet via Video Enabled Telemedicine Application  (Video is Caregility application) and verified that I am speaking with the correct person using two identifiers. Discussed confidentiality: Yes   I discussed the limitations of telemedicine and the availability of in person appointments.  Discussed there is a possibility of technology failure and discussed alternative modes of communication if that failure occurs.  I discussed that engaging in this telemedicine visit, they consent to the provision of behavioral healthcare and the services will be billed under their insurance.  Patient and/or legal guardian expressed understanding and consented to Telemedicine visit: Yes   Presenting Concerns: Patient and/or family reports the following symptoms/concerns: Reports feeling depressed, worrying, anxiousness, and low interest in activities. Reports that she is continuing to grow her social life and going to church. Pt reports that her "adopted father" passed away one week ago. Reports that she had not spoke to hm in 9 years. Reports they stopped talking due to her daughter dating an African-American man. Reports that she feels bad due to them never repairing their relationship;.  Duration of problem: 5 months; Severity of problem: moderate  Patient and/or Family's Strengths/Protective Factors: Social connections, Social and Emotional competence, and Sense of purpose  Goals Addressed: Patient will:  Reduce symptoms of: anxiety and  depression   Increase knowledge and/or ability of: coping skills and healthy habits   Demonstrate ability to: Increase healthy adjustment to current life circumstances  Progress towards Goals: Ongoing  Interventions: Interventions utilized:  Mindfulness or Psychologist, educational, CBT Cognitive Behavioral Therapy, Supportive Counseling, and Psychoeducation and/or Health Education Standardized Assessments completed: Not Needed  Patient and/or Family Response: Pt receptive to tx. Pt receptive to psychoeducation on grief. Pt receptive to support as discussed her relationship with her adopted father. Pt receptive to cognitive restructuring to decrease negative thoughts. Pt receptive to continuing to adhere to medication, deep breathing, and increasing socialization.    Assessment: Denies SI/HI. Denies auditory/visual hallucinations. No safety risks. Patient currently experiencing an adjustment reaction with anxiety and depressed mood. Pt continues to adjust to health changes. Pt is also experiencing grief reaction.   Patient may benefit from continuing healthy coping skills. LCSWA provided psychoeducation on grief. LCSWA utilized cognitive restructuring to decrease negative thoughts. LCSWA encouraged pt to contnue adhering to medication, deep breathing, and increasing socialization. LCSWA will fu with pt.  Plan: Follow up with behavioral health clinician on : 08/01/21 Behavioral recommendations: Continue adhering to medication, deep breathing, and increasing socialization. Referral(s): [place] (In Clinic)  I discussed the assessment and treatment plan with the patient and/or parent/guardian. They were provided an opportunity to ask questions and all were answered. They agreed with the plan and demonstrated an understanding of the instructions.   They were advised to call back or seek an in-person evaluation if the symptoms worsen or if the condition fails to improve as  anticipated.  Austen Wygant C Gaven Eugene, LCSW

## 2021-07-13 ENCOUNTER — Ambulatory Visit (HOSPITAL_COMMUNITY)
Admission: RE | Admit: 2021-07-13 | Discharge: 2021-07-13 | Disposition: A | Discharge status: Home / Self Care | Payer: Medicaid Other | Source: Ambulatory Visit | Attending: Family Medicine | Admitting: Family Medicine

## 2021-07-13 ENCOUNTER — Other Ambulatory Visit: Payer: Self-pay

## 2021-07-13 DIAGNOSIS — N939 Abnormal uterine and vaginal bleeding, unspecified: Secondary | ICD-10-CM | POA: Insufficient documentation

## 2021-07-14 ENCOUNTER — Telehealth: Payer: Self-pay

## 2021-07-14 ENCOUNTER — Other Ambulatory Visit: Payer: Self-pay | Admitting: Family Medicine

## 2021-07-14 DIAGNOSIS — N939 Abnormal uterine and vaginal bleeding, unspecified: Secondary | ICD-10-CM

## 2021-07-14 NOTE — Telephone Encounter (Signed)
-----   Message from Charlott Rakes, MD sent at 07/14/2021  9:30 AM EST ----- Please inform her that her pelvic ultrasound revealed presence of a thickened uterus lining.  I have referred her to GYN for further evaluation

## 2021-07-14 NOTE — Telephone Encounter (Signed)
Patient name and DOB has been verified Patient was informed of lab results. Patient had no questions.  

## 2021-07-28 ENCOUNTER — Telehealth: Payer: Self-pay | Admitting: Family Medicine

## 2021-07-28 NOTE — Telephone Encounter (Signed)
Pt is calling to schedule appt with Clifton James Please advise CB- [phone]

## 2021-07-28 NOTE — Telephone Encounter (Signed)
Pt is calling to reschedule 08/01/21 apt with [place] Please advise CB- [phone]

## 2021-07-31 NOTE — Telephone Encounter (Signed)
I return Pt call, schedule a financial appt for 08/07/21

## 2021-08-01 ENCOUNTER — Encounter: Payer: Medicaid Other | Admitting: Family Medicine

## 2021-08-01 ENCOUNTER — Encounter: Payer: Medicaid Other | Admitting: Clinical

## 2021-08-02 ENCOUNTER — Telehealth: Payer: Self-pay | Admitting: Radiology

## 2021-08-02 NOTE — Telephone Encounter (Signed)
Spoke with patient about scheduling New Patient appointment at [facility] for AUB from referral from Dr. Margarita Rana. Patient has [place] which will not cover appointment. Patient is scheduled for an appointment with [facility] on 77/41/28 for application or [place] and will call [facility] once she receives assistance to schedule appointment. Office number provided to patient.

## 2021-08-04 ENCOUNTER — Other Ambulatory Visit: Payer: Self-pay

## 2021-08-07 ENCOUNTER — Ambulatory Visit: Payer: Medicaid Other | Attending: Family Medicine

## 2021-08-07 ENCOUNTER — Other Ambulatory Visit: Payer: Self-pay

## 2021-08-08 ENCOUNTER — Ambulatory Visit (INDEPENDENT_AMBULATORY_CARE_PROVIDER_SITE_OTHER): Payer: Self-pay | Admitting: Clinical

## 2021-08-08 DIAGNOSIS — F4323 Adjustment disorder with mixed anxiety and depressed mood: Secondary | ICD-10-CM

## 2021-08-11 NOTE — BH Specialist Note (Signed)
Integrated Behavioral Health via Telemedicine Visit  08/08/2021 Brenda [name] [id number]  Number of [place] visits: 6 Session Start time: 9:00am  Session End time: 9:30am Total time: 30  Referring Provider: PCP Charlott Rakes, MD Patient/Family location: Home  [facility] Provider location: [facility] All persons participating in visit: Pt and LCSWA Types of Service: Individual psychotherapy and Video visit  I connected with Brenda [name] via Video Enabled Telemedicine Application  (Video is Caregility application) and verified that I am speaking with the correct person using two identifiers. Discussed confidentiality: Yes   I discussed the limitations of telemedicine and the availability of in person appointments.  Discussed there is a possibility of technology failure and discussed alternative modes of communication if that failure occurs.  I discussed that engaging in this telemedicine visit, they consent to the provision of behavioral healthcare and the services will be billed under their insurance.  Patient and/or legal guardian expressed understanding and consented to Telemedicine visit: Yes   Presenting Concerns: Patient and/or family reports the following symptoms/concerns: Reports that she experiences worry with her health. Reports that she feels down and anxious at times. Reports that she is still grieving the loss of her father. Duration of problem: 6 months; Severity of problem: moderate  Patient and/or Family's Strengths/Protective Factors: Social connections, Social and Emotional competence, and Sense of purpose  Goals Addressed: Patient will:  Reduce symptoms of: anxiety and depression   Increase knowledge and/or ability of: coping skills and healthy habits   Demonstrate ability to: Increase healthy adjustment to current life circumstances  Progress towards Goals: Ongoing  Interventions: Interventions utilized:  Mindfulness or  Psychologist, educational, CBT Cognitive Behavioral Therapy, and Supportive Counseling Standardized Assessments completed: Not Needed  Patient and/or Family Response: Pt receptive to tx. Pt receptive to psychoeducation provided on grief. Pt receptive to thought reframing. Pt will continue adhering to medication, deep breathing, and increasing socialization.  Assessment: Denies SI/HI. Denies auditory/visual hallucinations. No safety risks. Patient currently experiencing anxiety and depression. Pt experiences worries related to her health and often feels bad about eating habits. Pt appears to be improving her awareness of eating habits. Pt also continuing to experience grief.   Patient may benefit from continuing healthy coping skills and additional brief interventions. LCSWA provided psychoeducation on grief. LCSWA utilized thought reframing. LCSWA encouraged pt to continue deep breathing exercises, adhering to medication, and socializing. Pt continues to wait on whether she is approved for medicaid..  Plan: Follow up with behavioral health clinician on : 09/12/21 Behavioral recommendations: Utilize deep breathing exercises, adhering to medication, and socializing. Referral(s): [place] (In Clinic)  I discussed the assessment and treatment plan with the patient and/or parent/guardian. They were provided an opportunity to ask questions and all were answered. They agreed with the plan and demonstrated an understanding of the instructions.   They were advised to call back or seek an in-person evaluation if the symptoms worsen or if the condition fails to improve as anticipated.  Akaya Proffit C Trevor Wilkie, LCSW

## 2021-09-05 ENCOUNTER — Encounter: Payer: Medicaid Other | Admitting: Family Medicine

## 2021-09-06 ENCOUNTER — Other Ambulatory Visit: Payer: Self-pay

## 2021-09-12 ENCOUNTER — Other Ambulatory Visit: Payer: Self-pay

## 2021-09-12 ENCOUNTER — Encounter: Payer: Self-pay | Admitting: Clinical

## 2021-09-12 ENCOUNTER — Ambulatory Visit (INDEPENDENT_AMBULATORY_CARE_PROVIDER_SITE_OTHER): Payer: Medicaid Other | Admitting: Clinical

## 2021-09-12 DIAGNOSIS — F4323 Adjustment disorder with mixed anxiety and depressed mood: Secondary | ICD-10-CM

## 2021-09-12 NOTE — BH Specialist Note (Signed)
Integrated Behavioral Health via Telemedicine Visit  09/12/2021 Brenda [name] [id number]  Number of [place] visits: 7 Session Start time: 8:30am  Session End time: 9:00am Total time: 30  Referring Provider: Charlott Rakes, MD Patient/Family location: Home [facility] Provider location: [facility]  All persons participating in visit: Pt and LCSWA Types of Service: Individual psychotherapy and Video visit  I connected with Brenda [name] via Video Enabled Telemedicine Application  (Video is Caregility application) and verified that I am speaking with the correct person using two identifiers. Discussed confidentiality: Yes   I discussed the limitations of telemedicine and the availability of in person appointments.  Discussed there is a possibility of technology failure and discussed alternative modes of communication if that failure occurs.  I discussed that engaging in this telemedicine visit, they consent to the provision of behavioral healthcare and the services will be billed under their insurance.  Patient and/or legal guardian expressed understanding and consented to Telemedicine visit: Yes   Presenting Concerns: Patient and/or family reports the following symptoms/concerns: Reports sometimes feeling down about her physical health. Reports that she worries about her physical health and wants to continue making improvements. Reports experiencing a problem with a long term menstrual cycle for about two years and needing to see a Editor, commissioning.  Duration of problem: 6 months; Severity of problem: moderate  Patient and/or Family's Strengths/Protective Factors: Social connections, Social and Emotional competence, and Sense of purpose  Goals Addressed: Patient will:  Reduce symptoms of: anxiety and depression   Increase knowledge and/or ability of: coping skills and healthy habits   Demonstrate ability to: Increase healthy adjustment to current life  circumstances  Progress towards Goals: Ongoing  Interventions: Interventions utilized:  Mindfulness or Psychologist, educational, CBT Cognitive Behavioral Therapy, and Supportive Counseling Standardized Assessments completed: Not Needed  Patient and/or Family Response: Pt receptive to tx. Pt receptive to psychoeducation provided on the association between physical and mental health. Pt receptive to support provided.  Assessment: Denies SI/HI. Denies auditory/visual hallucinations. Patient currently experiencing difficulty adjusting to physical health. However pt's mood appears to be improving..   Patient may benefit from brief interventions. LCSWA provided psychoeducation on the association between mental and physical health LCSWA provided support and reminded pt of integration. Pt will have an additional session.  Plan: Follow up with behavioral health clinician on : 10/10/21 Behavioral recommendations: FU with LCSWA Referral(s): [place] (In Clinic)  I discussed the assessment and treatment plan with the patient and/or parent/guardian. They were provided an opportunity to ask questions and all were answered. They agreed with the plan and demonstrated an understanding of the instructions.   They were advised to call back or seek an in-person evaluation if the symptoms worsen or if the condition fails to improve as anticipated.  Eulogia Dismore C Shanette Tamargo, LCSW

## 2021-09-20 ENCOUNTER — Other Ambulatory Visit (HOSPITAL_COMMUNITY): Payer: Self-pay

## 2021-10-03 ENCOUNTER — Encounter: Payer: Self-pay | Admitting: Family Medicine

## 2021-10-03 ENCOUNTER — Ambulatory Visit: Payer: Self-pay | Attending: Family Medicine | Admitting: Family Medicine

## 2021-10-03 ENCOUNTER — Other Ambulatory Visit: Payer: Self-pay

## 2021-10-03 ENCOUNTER — Ambulatory Visit
Admission: RE | Admit: 2021-10-03 | Discharge: 2021-10-03 | Disposition: A | Discharge status: Home / Self Care | Payer: No Typology Code available for payment source | Source: Ambulatory Visit | Attending: Family Medicine | Admitting: Family Medicine

## 2021-10-03 VITALS — BP 132/82 | HR 101 | Ht 61.0 in | Wt 294.8 lb

## 2021-10-03 DIAGNOSIS — I110 Hypertensive heart disease with heart failure: Secondary | ICD-10-CM

## 2021-10-03 DIAGNOSIS — F32A Depression, unspecified: Secondary | ICD-10-CM

## 2021-10-03 DIAGNOSIS — Z794 Long term (current) use of insulin: Secondary | ICD-10-CM

## 2021-10-03 DIAGNOSIS — E1142 Type 2 diabetes mellitus with diabetic polyneuropathy: Secondary | ICD-10-CM

## 2021-10-03 DIAGNOSIS — I5032 Chronic diastolic (congestive) heart failure: Secondary | ICD-10-CM

## 2021-10-03 DIAGNOSIS — E118 Type 2 diabetes mellitus with unspecified complications: Secondary | ICD-10-CM

## 2021-10-03 DIAGNOSIS — E1169 Type 2 diabetes mellitus with other specified complication: Secondary | ICD-10-CM

## 2021-10-03 DIAGNOSIS — F419 Anxiety disorder, unspecified: Secondary | ICD-10-CM

## 2021-10-03 DIAGNOSIS — E785 Hyperlipidemia, unspecified: Secondary | ICD-10-CM

## 2021-10-03 LAB — GLUCOSE, POCT (MANUAL RESULT ENTRY): POC Glucose: 148 mg/dl — AB (ref 70–99)

## 2021-10-03 LAB — POCT GLYCOSYLATED HEMOGLOBIN (HGB A1C): HbA1c, POC (controlled diabetic range): 8 % — AB (ref 0.0–7.0)

## 2021-10-03 MED ORDER — ATORVASTATIN CALCIUM 80 MG PO TABS
80.0000 mg | ORAL_TABLET | Freq: Every evening | ORAL | 6 refills | Status: DC
  Filled 2021-10-03 – 2021-10-10 (×2): qty 30, 30d supply, fill #0
  Filled 2021-11-16: qty 30, 30d supply, fill #1
  Filled 2021-12-25: qty 30, 30d supply, fill #2
  Filled 2022-02-06 – 2022-02-26 (×2): qty 30, 30d supply, fill #3
  Filled 2022-03-22: qty 30, 30d supply, fill #4

## 2021-10-03 MED ORDER — LEVEMIR FLEXTOUCH 100 UNIT/ML ~~LOC~~ SOPN
22.0000 [IU] | PEN_INJECTOR | Freq: Two times a day (BID) | SUBCUTANEOUS | 6 refills | Status: DC
  Filled 2021-10-03 – 2021-10-10 (×2): qty 15, 34d supply, fill #0
  Filled 2021-12-07: qty 15, 34d supply, fill #1

## 2021-10-03 MED ORDER — TRULICITY 1.5 MG/0.5ML ~~LOC~~ SOAJ
1.5000 mg | SUBCUTANEOUS | 6 refills | Status: DC
  Filled 2021-10-03 – 2021-10-10 (×2): qty 2, 28d supply, fill #0
  Filled 2021-11-07: qty 2, 28d supply, fill #1

## 2021-10-03 MED ORDER — EMPAGLIFLOZIN 10 MG PO TABS
10.0000 mg | ORAL_TABLET | Freq: Every day | ORAL | 6 refills | Status: DC
  Filled 2021-10-03 – 2022-03-11 (×2): qty 30, 30d supply, fill #0
  Filled 2022-04-13: qty 30, 30d supply, fill #1

## 2021-10-03 MED ORDER — GABAPENTIN 300 MG PO CAPS
300.0000 mg | ORAL_CAPSULE | Freq: Every day | ORAL | 6 refills | Status: DC
  Filled 2021-10-03 – 2021-10-10 (×2): qty 30, 30d supply, fill #0

## 2021-10-03 MED ORDER — FLUOXETINE HCL 20 MG PO CAPS
20.0000 mg | ORAL_CAPSULE | Freq: Every day | ORAL | 6 refills | Status: DC
  Filled 2021-10-03 – 2021-10-10 (×2): qty 30, 30d supply, fill #0
  Filled 2021-12-07: qty 30, 30d supply, fill #1
  Filled 2022-01-08: qty 30, 30d supply, fill #2
  Filled 2022-02-11: qty 30, 30d supply, fill #3
  Filled 2022-03-11: qty 30, 30d supply, fill #4
  Filled 2022-04-13: qty 30, 30d supply, fill #5

## 2021-10-03 NOTE — Progress Notes (Signed)
Discuss insulin. Numbness and burning in feet at night

## 2021-10-03 NOTE — Patient Instructions (Signed)
Cardiac Rehabilitation What is cardiac rehabilitation? Cardiac rehabilitation is a treatment program that helps improve the health and well-being of people who have heart problems. Cardiac rehabilitation includes exercise training, education, and counseling to help you get stronger and return to an active lifestyle. This program can help you get better faster and reduce any future hospital stays. Why might I need cardiac rehabilitation? Cardiac rehabilitation programs can help when you have or have had: A heart attack. Heart failure. Peripheral artery disease. Coronary artery disease. Angina. Lung or breathing problems. Cardiac rehabilitation programs are also used when you have had: Coronary artery bypass graft surgery. Heart valve replacement. Heart stent placement. Heart transplant. Aneurysm repair. What are the benefits of cardiac rehabilitation? Cardiac rehabilitation can help you: Reduce problems like chest pain and trouble breathing. Change risk factors that contribute to heart disease, such as: Smoking. High blood pressure. High cholesterol. Diabetes. Being inactive. Weighing over 30% more than your ideal weight. Diet. Improve your emotional outlook so you feel: More hopeful. Better about yourself. More confident about taking care of yourself. Get support from health experts as well as other people with similar problems. Learn healthy ways to manage stress. Learn how to manage and understand your medicines. Teach your family about your condition and how to participate in your recovery. What happens in cardiac rehabilitation? You will be assessed by a cardiac rehabilitation team. They will check your health history and do a physical exam. You may need blood tests, exercise stress tests, and other evaluations to make sure that you are ready to start cardiac rehabilitation. The cardiac rehabilitation team works with you to make a plan based on your health and goals. Your  program will be tailored to fit you and your needs and may change as you progress. You may work with a health care team that includes: Doctors. Nurses. Dietitians. Psychologists. Exercise specialists. Physical and occupational therapists. What are the phases of cardiac rehabilitation? A cardiac rehabilitation program is often divided into phases. You advance from one phase to the next. Phase 1 This phase starts while you are still in the hospital. You may: Start by walking in your room and then in the hall. Do some simple exercises with a therapist.  Phase 2 This phase begins when you go home or to another facility. You will travel to a cardiac rehabilitation center or another place where rehabilitation is offered. This phase may last 8-12 weeks. During this phase: You will slowly increase your activity level while being closely watched by a nurse or therapist. You will have medical tests and exams to monitor your progress. Your exercises may include strength or resistance training along with activities that cause your heart to beat faster (aerobic exercises), such as walking on a treadmill. Your condition will determine how often and how long these sessions last. You may learn how to: [facility] heart-healthy meals. Control your blood sugar, if this applies. Stop smoking. Manage your medicines. You may need help with scheduling or planning how and when to take your medicines. If you have questions about your medicines, it is very important that you talk with your health care provider.  Phase 3 This phase continues for the rest of your life. In this phase: There will be less supervision. You may continue to participate in cardiac rehabilitation activities or become part of a group in your community. You may benefit from talking about your experience with other people who are facing similar challenges. Follow these instructions at home: Take over-the-counter and  prescription medicines only  as told by your health care provider. Keep all follow-up visits as told by your health care provider. This is important. Get help right away if: You have severe chest discomfort, especially if the pain is crushing or pressure-like and spreads to your arms, back, neck, or jaw. Do not wait to see if the pain will go away. You have weakness or numbness in your face, arms, or legs, especially on one side of the body. Your speech is slurred. You are confused. You have a sudden, severe headache or loss of vision. You have shortness of breath. You are sweating and have nausea. You feel dizzy or faint. You are fatigued. These symptoms may represent a serious problem that is an emergency. Do not wait to see if the symptoms will go away. Get medical help right away. Call your local emergency services (911 in the [place] Do not drive yourself to the hospital. Summary Cardiac rehabilitation is a treatment program that helps improve the health and well-being of people who have heart problems. A cardiac rehabilitation program is often divided into phases. You advance from one phase to the next. The cardiac rehabilitation team works with you to make a plan based on your health and goals. Cardiac rehabilitation includes exercise training, education, and counseling to help you get stronger and return to an active lifestyle. This information is not intended to replace advice given to you by your health care provider. Make sure you discuss any questions you have with your health care provider. Document Revised: 12/20/2020 Document Reviewed: 12/20/2020 Elsevier Patient Education  [place].

## 2021-10-03 NOTE — Progress Notes (Signed)
Subjective:  Patient ID: Brenda [name], female    DOB: [birth year]  Age: 61 y.o. MRN: [id number]  CC: Diabetes   HPI Brenda [name] is a 61 y.o. year old female with a history of is a 61 y.o. year old female with a history of type 2 diabetes mellitus (A1c 8.6, obstructive sleep apnea (on CPAP), hypertension, CAD status post multiple stent, STEMI in 10/2019,  HFpEF (EF 60-65%), chronic respiratory failure on 2L oxygen.  Interval History: Her Dad passed 2 months ago and she had a set back with her Diabetes, overeating and gaining weight and taking her medications late.  She has since gotten connected with the therapist and is starting to do better. She would like to go on Levemir and Humalog as she previously obtained the [employer] brand of her insulin. A1c is 8.0 down from 8.6 previously; she has had no hypoglycemic episodes.  Neuropathy is controlled on gabapentin.  She complains of dyspnea and is worried that she felt better after being discharged from the hospital in the middle of last year and was doing good but now is starting to feel worse.  She is finding that simple household chores causing her to be dyspneic.  She has no pedal edema and her weight appears stable compared to her last visit  20-month ago. She has not had a cardiology visit since 02/2021. She has not been taking Effient even though it appears on her med list.  Past Medical History:  Diagnosis Date   Arthritis    "back, hands, hips" (02/22/2015)   CAD (coronary artery disease)    a. complex LAD/diagonal bifurcation PCI in 2010. b. STEMI 10/2019 s/p PTCA/DES x1 to mLAD overlapping the old stent, residual disease treated medicaly.   Depression    Diabetes mellitus ([place]    "started when I was pregnant; not sure if it was type 1 or type 2"    Hypercholesterolemia    Hypertension    Morbid obesity ([place]    Sleep apnea    "suppose to wear mask; I don't" (02/22/2015)    Past Surgical History:  Procedure  Laterality Date   CARDIAC CATHETERIZATION  12/2012   CHOLECYSTECTOMY OPEN  1989   CORONARY ANGIOPLASTY WITH STENT PLACEMENT  2011   CORONARY/GRAFT ACUTE MI REVASCULARIZATION N/A 10/26/2019   Procedure: CORONARY/GRAFT ACUTE MI REVASCULARIZATION;  Surgeon: MBurnell Blanks MD;  [facility];  Service: Cardiovascular;  Laterality: N/A;   HERNIA REPAIR     LEFT HEART CATH AND CORONARY ANGIOGRAPHY N/A 10/26/2019   Procedure: LEFT HEART CATH AND CORONARY ANGIOGRAPHY;  Surgeon: MBurnell Blanks MD;  [facility];  Service: Cardiovascular;  Laterality: N/A;   LEFT HEART CATHETERIZATION WITH CORONARY ANGIOGRAM N/A 12/25/2012   Procedure: LEFT HEART CATHETERIZATION WITH CORONARY ANGIOGRAM;  Surgeon: MSherren Mocha MD;  [facility];  Service: Cardiovascular;  Laterality: N/A;   UMBILICAL HERNIA REPAIR  144's   Family History  Problem Relation Age of Onset   Heart attack Father        in his mid 637s  COPD Father    Coronary artery disease Mother        in her mid 667s  Hypertension Mother    Cancer Mother    Diabetes Sister    Coronary artery disease Sister        valve replacement   Stroke Sister        multiple  Allergies  Allergen Reactions   Hydrocodone Shortness Of Breath    "I forget to breathe."    Clopidogrel Bisulfate     REACTION: rash   Hydrocodeine [Dihydrocodeine]     Outpatient Medications Prior to Visit  Medication Sig Dispense Refill   aspirin 81 MG EC tablet Take 1 tablet (81 mg total) by mouth daily. 30 tablet 11   atorvastatin (LIPITOR) 80 MG tablet Take 1 tablet (80 mg total) by mouth every evening. 30 tablet 6   Blood Glucose Monitoring Suppl (TRUE METRIX METER) DEVI 1 each by Does not apply route 3 (three) times daily. 1 Device 0   empagliflozin (JARDIANCE) 10 MG TABS tablet Take 1 tablet (10 mg total) by mouth daily. 30 tablet 3   FLUoxetine (PROZAC) 20 MG capsule Take 1 capsule (20 mg total) by mouth  daily. 30 capsule 6   furosemide (LASIX) 40 MG tablet Take 1 tablet (40 mg total) by mouth daily. 30 tablet 6   gabapentin (NEURONTIN) 300 MG capsule Take 1 capsule (300 mg total) by mouth at bedtime. 30 capsule 3   glucose blood (TRUE METRIX BLOOD GLUCOSE TEST) test strip Use as instructed 100 each 5   insulin NPH Human (HUMULIN N) 100 UNIT/ML injection Inject 0.22 mLs (22 Units total) into the skin 2 (two) times daily before a meal. 10 mL 11   Insulin Pen Needle (NOVOFINE) 30G X 8 MM MISC Use as directed with Novolin 70/30 insulin 100 each 1   insulin regular (NOVOLIN R) 100 units/mL injection Inject 0.15 mLs (15 Units total) into the skin 3 (three) times daily before meals. 30 mL 0   Insulin Syringe-Needle U-100 30G X 1/2" 0.3 ML MISC use as directed 5 (five) times daily with insulin 150 each 2   nitroGLYCERIN (NITROSTAT) 0.4 MG SL tablet Place 1 tablet (0.4 mg total) under the tongue every 5 (five) minutes as needed for chest pain (up to 3 doses. If taking 3rd dose call 911). 25 tablet 3   prasugrel (EFFIENT) 10 MG TABS tablet Take 1 tablet (10 mg total) by mouth daily. Pt needs to make appt with provider for further refills - 1st attempt 30 tablet 1   sacubitril-valsartan (ENTRESTO) 24-26 MG Take 1 tablet by mouth 2 (two) times daily. 60 tablet 11   TRUEPLUS LANCETS 30G MISC 1 each by Does not apply route 3 (three) times daily. 100 each 5   Dulaglutide (TRULICITY) 7.32 KG/2.5KY SOPN Inject 0.75 mg into the skin once a week. 2 mL 6   No facility-administered medications prior to visit.     ROS Review of Systems  Constitutional:  Negative for activity change, appetite change and fatigue.  HENT:  Negative for congestion, sinus pressure and sore throat.   Eyes:  Negative for visual disturbance.  Respiratory:  Positive for shortness of breath. Negative for cough, chest tightness and wheezing.   Cardiovascular:  Negative for chest pain and palpitations.  Gastrointestinal:  Negative for  abdominal distention, abdominal pain and constipation.  Endocrine: Negative for polydipsia.  Genitourinary:  Negative for dysuria and frequency.  Musculoskeletal:  Negative for arthralgias and back pain.  Skin:  Negative for rash.  Neurological:  Negative for tremors, light-headedness and numbness.  Hematological:  Does not bruise/bleed easily.  Psychiatric/Behavioral:  Negative for agitation and behavioral problems.    Objective:  BP 132/82    Pulse (!) 101    Ht _0  (1.549 m)    Wt 294 lb 12.8 oz (133.7  kg)    SpO2 100%    BMI 55.70 kg/m   BP/Weight 10/03/2021 06/26/2021 1/51/7616  Systolic BP 073 710 626  Diastolic BP 82 84 77  Wt. (Lbs) 294.8 296.6 314  BMI 55.7 56.04 59.33      Physical Exam Constitutional:      Appearance: She is well-developed. She is obese.  Neck:     Comments: No JVD Cardiovascular:     Rate and Rhythm: Normal rate.     Heart sounds: Normal heart sounds. No murmur heard. Pulmonary:     Effort: Pulmonary effort is normal.     Breath sounds: Normal breath sounds. No wheezing or rales.  Chest:     Chest wall: No tenderness.  Abdominal:     General: Bowel sounds are normal. There is no distension.     Palpations: Abdomen is soft. There is no mass.     Tenderness: There is no abdominal tenderness.  Musculoskeletal:        General: Normal range of motion.     Right lower leg: No edema.     Left lower leg: No edema.  Neurological:     Mental Status: She is alert and oriented to person, place, and time.  Psychiatric:        Mood and Affect: Mood normal.    CMP Latest Ref Rng & Units 06/26/2021 02/21/2021 02/14/2021  Glucose 70 - 99 mg/dL 82 93 92  BUN 6 - 24 mg/dL _0 Creatinine 0.57 - 1.00 mg/dL 1.04(H) 0.65 0.77  Sodium 134 - 144 mmol/L 141 136 135  Potassium 3.5 - 5.2 mmol/L 4.6 4.2 3.7  Chloride 96 - 106 mmol/L 98 95(L) 94(L)  CO2 20 - 29 mmol/L 26 32 32  Calcium 8.7 - 10.2 mg/dL 9.3 9.2 8.9  Total Protein 6.0 - 8.5 g/dL 7.3 - -   Total Bilirubin 0.0 - 1.2 mg/dL 0.4 - -  Alkaline Phos 44 - 121 IU/L 133(H) - -  AST 0 - 40 IU/L 15 - -  ALT 0 - 32 IU/L 13 - -    Lipid Panel     Component Value Date/Time   CHOL 111 12/16/2019 1041   TRIG 80 12/16/2019 1041   HDL 50 12/16/2019 1041   CHOLHDL 2.2 12/16/2019 1041   CHOLHDL 3.5 10/27/2019 0219   VLDL 21 10/27/2019 0219   LDLCALC 45 12/16/2019 1041   LDLDIRECT 135.9 02/22/2011 1132    CBC    Component Value Date/Time   WBC 9.8 02/14/2021 0437   RBC 4.32 02/14/2021 0437   HGB 9.0 (L) 02/14/2021 0437   HCT 32.5 (L) 02/14/2021 0437   PLT 246 02/14/2021 0437   MCV 75.2 (L) 02/14/2021 0437   MCH 20.8 (L) 02/14/2021 0437   MCHC 27.7 (L) 02/14/2021 0437   RDW 22.7 (H) 02/14/2021 0437   LYMPHSABS 1.1 02/08/2021 1642   MONOABS 0.5 02/08/2021 1642   EOSABS 0.2 02/08/2021 1642   BASOSABS 0.1 02/08/2021 1642    Lab Results  Component Value Date   HGBA1C 8.0 (A) 10/03/2021    Assessment & Plan:  1. Type 2 diabetes mellitus with complication, with long-term current use of insulin (HCC) Uncontrolled with A1c of 8.0; goal is less than 7.0 Increased dose of Trulicity I have changed her insulin regimen to Levemir and Humalog per request Counseled on Diabetic diet, my plate method, 948 minutes of moderate intensity exercise/week Blood sugar logs with fasting goals of 80-120 mg/dl, random of less  than 180 and in the event of sugars less than 60 mg/dl or greater than 400 mg/dl encouraged to notify the clinic. Advised on the need for annual eye exams, annual foot exams, Pneumonia vaccine. - POCT glucose (manual entry) - POCT glycosylated hemoglobin (Hb A1C) - Microalbumin / creatinine urine ratio; Future - LP+Non-HDL Cholesterol; Future - CMP14+EGFR; Future - Dulaglutide (TRULICITY) 1.5 VF/6.4PP SOPN; Inject 1.5 mg into the skin once a week.  Dispense: 2 mL; Refill: 6 - insulin detemir (LEVEMIR FLEXTOUCH) 100 UNIT/ML FlexPen; Inject 22 Units into the skin 2 (two)  times daily.  Dispense: 30 mL; Refill: 6  2. Anxiety and depression Worsened by passing of her dad 2 months ago She is currently undergoing psychotherapy and is starting to feel better - FLUoxetine (PROZAC) 20 MG capsule; Take 1 capsule (20 mg total) by mouth daily.  Dispense: 30 capsule; Refill: 6  3. Diabetic polyneuropathy associated with type 2 diabetes mellitus (HCC) Stable - gabapentin (NEURONTIN) 300 MG capsule; Take 1 capsule (300 mg total) by mouth at bedtime.  Dispense: 30 capsule; Refill: 6  4. Hyperlipidemia associated with type 2 diabetes mellitus (HCC) Controlled Low-cholesterol diet - atorvastatin (LIPITOR) 80 MG tablet; Take 1 tablet (80 mg total) by mouth every evening.  Dispense: 30 tablet; Refill: 6  5. Hypertensive heart disease with chronic diastolic congestive heart failure ([place]) She is experiencing ongoing dyspnea Currently on guideline directed medical therapy with Ferne Coe, beta-blocker Will check BNP and chest x-ray Referred to cardiology as she has not seen them in a while Cardiac rehab will be beneficial but will defer to cardiology - Pro b natriuretic peptide; Future - DG Chest 2 View; Future - empagliflozin (JARDIANCE) 10 MG TABS tablet; Take 1 tablet (10 mg total) by mouth daily.  Dispense: 30 tablet; Refill: 6 - Ambulatory referral to Cardiology   No orders of the defined types were placed in this encounter.   Follow-up: Return in about 1 month (around 10/31/2021) for Chronic medical conditions.       Charlott Rakes, MD, FAAFP. [facility] [place], [place]   10/03/2021, 3:25 PM

## 2021-10-04 ENCOUNTER — Ambulatory Visit: Payer: Medicaid Other | Attending: Family Medicine

## 2021-10-04 DIAGNOSIS — E118 Type 2 diabetes mellitus with unspecified complications: Secondary | ICD-10-CM

## 2021-10-04 DIAGNOSIS — I110 Hypertensive heart disease with heart failure: Secondary | ICD-10-CM

## 2021-10-04 DIAGNOSIS — Z794 Long term (current) use of insulin: Secondary | ICD-10-CM

## 2021-10-05 ENCOUNTER — Telehealth: Payer: Self-pay

## 2021-10-05 LAB — MICROALBUMIN / CREATININE URINE RATIO
Creatinine, Urine: 63.1 mg/dL
Microalb/Creat Ratio: 1481 mg/g creat — ABNORMAL HIGH (ref 0–29)
Microalbumin, Urine: 934.3 ug/mL

## 2021-10-05 LAB — CMP14+EGFR
ALT: 18 IU/L (ref 0–32)
AST: 23 IU/L (ref 0–40)
Albumin/Globulin Ratio: 1.4 (ref 1.2–2.2)
Albumin: 4.4 g/dL (ref 3.8–4.9)
Alkaline Phosphatase: 103 IU/L (ref 44–121)
BUN/Creatinine Ratio: 15 (ref 9–23)
BUN: 16 mg/dL (ref 6–24)
Bilirubin Total: 0.2 mg/dL (ref 0.0–1.2)
CO2: 21 mmol/L (ref 20–29)
Calcium: 9.6 mg/dL (ref 8.7–10.2)
Chloride: 97 mmol/L (ref 96–106)
Creatinine, Ser: 1.07 mg/dL — ABNORMAL HIGH (ref 0.57–1.00)
Globulin, Total: 3.2 g/dL (ref 1.5–4.5)
Glucose: 93 mg/dL (ref 70–99)
Potassium: 4.6 mmol/L (ref 3.5–5.2)
Sodium: 141 mmol/L (ref 134–144)
Total Protein: 7.6 g/dL (ref 6.0–8.5)
eGFR: 60 mL/min/{1.73_m2} (ref >59)

## 2021-10-05 LAB — LP+NON-HDL CHOLESTEROL
Cholesterol, Total: 194 mg/dL (ref 100–199)
HDL: 49 mg/dL (ref >39)
LDL Chol Calc (NIH): 113 mg/dL — ABNORMAL HIGH (ref 0–99)
Total Non-HDL-Chol (LDL+VLDL): 145 mg/dL — ABNORMAL HIGH (ref 0–129)
Triglycerides: 185 mg/dL — ABNORMAL HIGH (ref 0–149)
VLDL Cholesterol Cal: 32 mg/dL (ref 5–40)

## 2021-10-05 LAB — PRO B NATRIURETIC PEPTIDE: NT-Pro BNP: 103 pg/mL (ref 0–287)

## 2021-10-05 NOTE — Telephone Encounter (Signed)
Patient name and DOB has been verified Patient was informed of lab results. Patient had no questions.  

## 2021-10-05 NOTE — Telephone Encounter (Signed)
-----   Message from Charlott Rakes, MD sent at 10/04/2021  2:17 PM EST ----- Please inform her that her chest x-ray does not reveal presence of fluid in her lungs or pneumonia.

## 2021-10-10 ENCOUNTER — Other Ambulatory Visit: Payer: Self-pay

## 2021-10-10 ENCOUNTER — Ambulatory Visit (INDEPENDENT_AMBULATORY_CARE_PROVIDER_SITE_OTHER): Payer: Self-pay | Admitting: Clinical

## 2021-10-10 DIAGNOSIS — F4323 Adjustment disorder with mixed anxiety and depressed mood: Secondary | ICD-10-CM

## 2021-10-12 ENCOUNTER — Other Ambulatory Visit: Payer: Self-pay

## 2021-10-12 NOTE — Progress Notes (Deleted)
Cardiology Office Note:    Date:  10/12/2021   ID:  Brenda [name], DOB 1961-05-04, MRN [id number]  PCP:  Charlott Rakes, [place] Providers Cardiologist:  Sherren Mocha, MD { Click to update primary MD,subspecialty MD or APP then REFRESH:1}  *** Referring MD: Charlott Rakes, MD   Chief Complaint:  No chief complaint on file. {Click here for Visit Info    :1}   Patient Profile: Coronary artery disease  S/p non-STEMI in 2000 tx w/ complex bifurcational LAD/Dx stenting Allergic to Plavix Cath 4/14: mod ISR in LAD and severe ISR in Dx >> med Rx S/p anterior STEMI 10/2019 >> Rx with DES to LAD (overlapping with old stent); chronic ost Dx stenosis (jailed by stent)  Diabetes mellitus, insulin dependent  OSA  Hypertension  Hyperlipidemia  Morbid obesity    Prior CV studies: Echocardiogram 10/26/2019 EF 60-65, no RWMA, mild conc LVH, Gr 1 DD, normal RVSF, mild LAE   Cardiac catheterization 10/26/2019 LAD prox stent patent w/ 10 ISR, mid 100 (thrombotic); D1 stent 80 ISR, then 70 LCx prox 30 PCI:  2.5 x 28 mm Synergy DES to mid LAD *** {Select studies to display:26339}   History of Present Illness:   [name] is a 61 y.o. female with the above problem list.  She was last seen in 3/21.        Past Medical History:  Diagnosis Date   Arthritis    "back, hands, hips" (02/22/2015)   CAD (coronary artery disease)    a. complex LAD/diagonal bifurcation PCI in 2010. b. STEMI 10/2019 s/p PTCA/DES x1 to mLAD overlapping the old stent, residual disease treated medicaly.   Depression    Diabetes mellitus ([place])    "started when I was pregnant; not sure if it was type 1 or type 2"    Hypercholesterolemia    Hypertension    Morbid obesity ([place])    Sleep apnea    "suppose to wear mask; I don't" (02/22/2015)   Current Medications: No outpatient medications have been marked as taking for the 10/13/21 encounter (Appointment) with Richardson Dopp T, PA-C.     Allergies:   Hydrocodone, Clopidogrel bisulfate, and Hydrocodeine [dihydrocodeine]   Social History   Tobacco Use   Smoking status: Former    Packs/day: 0.50    Years: 1.00    Pack years: 0.50    Types: Cigarettes, Cigars    Quit date: 09/03/1986    Years since quitting: 35.1   Smokeless tobacco: Never  Substance Use Topics   Alcohol use: No   Drug use: No    Family Hx: The patient's family history includes COPD in her father; Cancer in her mother; Coronary artery disease in her mother and sister; Diabetes in her sister; Heart attack in her father; Hypertension in her mother; Stroke in her sister.  ROS   EKGs/Labs/Other Test Reviewed:    EKG:  EKG is *** ordered today.  The ekg ordered today demonstrates ***  Recent Labs: 02/12/2021: TSH 1.837 02/14/2021: Hemoglobin 9.0; Magnesium 1.8; Platelets 246 02/21/2021: B Natriuretic Peptide 200.5 10/04/2021: ALT 18; BUN 16; Creatinine, Ser 1.07; NT-Pro BNP 103; Potassium 4.6; Sodium 141   Recent Lipid Panel Recent Labs    10/04/21 0839  CHOL 194  TRIG 185*  HDL 49  LDLCALC 113*     Risk Assessment/Calculations:   {Does this patient have ATRIAL FIBRILLATION?:[phone]}     Physical Exam:    VS:  There were no vitals taken for  this visit.    Wt Readings from Last 3 Encounters:  10/03/21 294 lb 12.8 oz (133.7 kg)  06/26/21 296 lb 9.6 oz (134.5 kg)  03/22/21 (!) 314 lb (142.4 kg)    Physical Exam ***     ASSESSMENT & PLAN:   No problem-specific Assessment & Plan notes found for this encounter. ***  1. Coronary artery disease involving native coronary artery of native heart without angina pectoris She is status post complex PCI to the LAD/diagonal in 2000 in the setting of a non-STEMI.  She recently was admitted in Feb 2021 with an anterior STEMI treat with a drug-eluting stent to the LAD overlapping with the previous stent.  She has residual disease in a jailed diagonal which is managed medically.  Since discharge, she has  been doing well without anginal symptoms.  She is able to get all of her medications except for Ticagrelor.  She is still waiting on Medicaid.  I will make sure that we fill out assistance paperwork for her and get her some samples so that she does not run out.  If she does not qualify for assistance, we can certainly switch her to prasugrel.  She notes that she can get this for $15.  As noted, I am not sure if this is for 30 days or for an entire year.  Therefore, I would not want to switch her unless she cannot get assistance for Ticagrelor and we can confirm she can get long term Prasugrel.  Continue aspirin, Ticagrelor, metoprolol tartrate, atorvastatin.  Obtain CMET in 2 weeks.  I have asked her to collect blood pressures over that timeframe.  If her creatinine, potassium are normal and her blood pressures are able to tolerate the addition of an ACE inhibitor, I will place her on low-dose lisinopril.  She can see me back in 6 to 8 weeks.  I have encouraged her to pursue cardiac rehabilitation.   2. Essential hypertension The patient's blood pressure is controlled on her current regimen.  Continue current therapy.    3. Type 2 diabetes mellitus with complication, with long-term current use of insulin ([place]) She is followed by primary care at [facility].   4. Hyperlipidemia associated with type 2 diabetes mellitus ([place]) Continue high-dose statin therapy.  Arrange fasting lipids, CMET in 2 weeks.      {Are you ordering a CV Procedure (e.g. stress test, cath, DCCV, TEE, etc)?   Press F2        :[id number]}  Dispo:  No follow-ups on file.   Medication Adjustments/Labs and Tests Ordered: Current medicines are reviewed at length with the patient today.  Concerns regarding medicines are outlined above.  Tests Ordered: No orders of the defined types were placed in this encounter.  Medication Changes: No orders of the defined types were placed in this  encounter.  Signed, Richardson Dopp, PA-C  10/12/2021 9:49 PM    [facility] [address], [place], [place]  [address] Phone: [phone]; Fax: [phone]

## 2021-10-13 ENCOUNTER — Telehealth: Payer: Self-pay

## 2021-10-13 ENCOUNTER — Ambulatory Visit: Payer: Medicaid Other | Admitting: Physician Assistant

## 2021-10-13 DIAGNOSIS — I10 Essential (primary) hypertension: Secondary | ICD-10-CM

## 2021-10-13 DIAGNOSIS — I5032 Chronic diastolic (congestive) heart failure: Secondary | ICD-10-CM

## 2021-10-13 DIAGNOSIS — E1169 Type 2 diabetes mellitus with other specified complication: Secondary | ICD-10-CM

## 2021-10-13 DIAGNOSIS — I2510 Atherosclerotic heart disease of native coronary artery without angina pectoris: Secondary | ICD-10-CM

## 2021-10-13 DIAGNOSIS — E118 Type 2 diabetes mellitus with unspecified complications: Secondary | ICD-10-CM

## 2021-10-13 NOTE — Telephone Encounter (Signed)
Patient name and DOB has been verified Patient was informed of lab results. Patient had no questions.  

## 2021-10-13 NOTE — Telephone Encounter (Signed)
-----   Message from Charlott Rakes, MD sent at 10/06/2021 11:11 AM EST ----- Cholesterol is slightly elevated compared to 1 year ago likely due to her not taking her medications as prescribed hence we will make no changes and encouraged her to be compliant.  Urine reveals presence of protein which means that diabetes is starting to affect her kidneys.  Hopefully with tight diabetes control and medications like Jardiance that we should see some improvement.

## 2021-10-13 NOTE — BH Specialist Note (Signed)
Integrated Behavioral Health via Telemedicine Visit  10/10/2021 Brenda [name] [id number]  Number of Brenda [name] Clinician visits: Additional Visit  Session Start time: 1110   Session End time: 1140  Total time in minutes: 50   Referring Provider: Charlott Rakes, MD Patient/Family location: Home [facility] Provider location: [facility] All persons participating in visit: Pt and LCSW Types of Service: Individual psychotherapy and Video visit  I connected with Brenda [name] via Video Enabled Telemedicine Application  (Video is Caregility application) and verified that I am speaking with the correct person using two identifiers. Discussed confidentiality: Yes   I discussed the limitations of telemedicine and the availability of in person appointments.  Discussed there is a possibility of technology failure and discussed alternative modes of communication if that failure occurs.  I discussed that engaging in this telemedicine visit, they consent to the provision of behavioral healthcare and the services will be billed under their insurance.  Patient and/or legal guardian expressed understanding and consented to Telemedicine visit: Yes   Presenting Concerns: Patient and/or family reports the following symptoms/concerns: Pt reports that she is continuing to adjust to physical health changes. Reports that sometimes worries about her health. Reports that she is changing her eating habits. Duration of problem: 7 months; Severity of problem: moderate  Patient and/or Family's Strengths/Protective Factors: Social connections, Social and Emotional competence, and Sense of purpose  Goals Addressed: Patient will:  Reduce symptoms of: anxiety and depression   Increase knowledge and/or ability of: coping skills and healthy habits   Demonstrate ability to: Increase healthy adjustment to current life circumstances  Progress towards  Goals: Ongoing  Interventions: Interventions utilized:  Mindfulness or Psychologist, educational, CBT Cognitive Behavioral Therapy, and Supportive Counseling Standardized Assessments completed: Not Needed  Patient and/or Family Response: Pt receptive to tx. Pt receptive to cognitive restructuring to decrease unhelpful thoughts. Pt receptive to deep breathing and continuing to adjust eating habits.   Assessment: Denies SI/HI. Patient currently experiencing improvements with adjusting to physical health. [name] mood has improved. Pt continues to adjust eating habits and spending time outside of the home.   Patient may benefit from following up with LCSW if needed. LCSW utilized cognitive restructuring to decrease unhelpful thoughts. LCSW encouraged pt to utilize deep breathing and continue adjusting eating habits. LCSW encouraged pt to fu if needed.  Plan: Follow up with behavioral health clinician on : PRN Behavioral recommendations: Utilize deep breathing and continue adjusting eating habits.  Referral(s): [place] (In Clinic)  I discussed the assessment and treatment plan with the patient and/or parent/guardian. They were provided an opportunity to ask questions and all were answered. They agreed with the plan and demonstrated an understanding of the instructions.   They were advised to call back or seek an in-person evaluation if the symptoms worsen or if the condition fails to improve as anticipated.  Richie Vadala C Annalissa Murphey, LCSW

## 2021-10-18 ENCOUNTER — Encounter (HOSPITAL_COMMUNITY): Payer: Self-pay | Admitting: Emergency Medicine

## 2021-10-18 ENCOUNTER — Emergency Department (HOSPITAL_COMMUNITY): Payer: Medicaid Other

## 2021-10-18 ENCOUNTER — Other Ambulatory Visit: Payer: Self-pay

## 2021-10-18 ENCOUNTER — Inpatient Hospital Stay (HOSPITAL_COMMUNITY)
Admission: EM | Admit: 2021-10-18 | Discharge: 2021-10-21 | DRG: 812 | Disposition: A | Discharge status: Home / Self Care | Payer: Medicaid Other | Attending: Family Medicine | Admitting: Family Medicine

## 2021-10-18 DIAGNOSIS — E7800 Pure hypercholesterolemia, unspecified: Secondary | ICD-10-CM | POA: Diagnosis present

## 2021-10-18 DIAGNOSIS — E1140 Type 2 diabetes mellitus with diabetic neuropathy, unspecified: Secondary | ICD-10-CM | POA: Diagnosis present

## 2021-10-18 DIAGNOSIS — I252 Old myocardial infarction: Secondary | ICD-10-CM

## 2021-10-18 DIAGNOSIS — Z825 Family history of asthma and other chronic lower respiratory diseases: Secondary | ICD-10-CM

## 2021-10-18 DIAGNOSIS — Z9981 Dependence on supplemental oxygen: Secondary | ICD-10-CM

## 2021-10-18 DIAGNOSIS — R079 Chest pain, unspecified: Secondary | ICD-10-CM

## 2021-10-18 DIAGNOSIS — Z8249 Family history of ischemic heart disease and other diseases of the circulatory system: Secondary | ICD-10-CM

## 2021-10-18 DIAGNOSIS — I5032 Chronic diastolic (congestive) heart failure: Secondary | ICD-10-CM | POA: Diagnosis present

## 2021-10-18 DIAGNOSIS — G4733 Obstructive sleep apnea (adult) (pediatric): Secondary | ICD-10-CM | POA: Diagnosis present

## 2021-10-18 DIAGNOSIS — N950 Postmenopausal bleeding: Secondary | ICD-10-CM | POA: Diagnosis present

## 2021-10-18 DIAGNOSIS — R0789 Other chest pain: Secondary | ICD-10-CM

## 2021-10-18 DIAGNOSIS — Z7982 Long term (current) use of aspirin: Secondary | ICD-10-CM

## 2021-10-18 DIAGNOSIS — Z794 Long term (current) use of insulin: Secondary | ICD-10-CM

## 2021-10-18 DIAGNOSIS — D649 Anemia, unspecified: Secondary | ICD-10-CM | POA: Diagnosis present

## 2021-10-18 DIAGNOSIS — Z66 Do not resuscitate: Secondary | ICD-10-CM | POA: Diagnosis not present

## 2021-10-18 DIAGNOSIS — Z7985 Long-term (current) use of injectable non-insulin antidiabetic drugs: Secondary | ICD-10-CM

## 2021-10-18 DIAGNOSIS — N938 Other specified abnormal uterine and vaginal bleeding: Secondary | ICD-10-CM | POA: Diagnosis present

## 2021-10-18 DIAGNOSIS — F32A Depression, unspecified: Secondary | ICD-10-CM | POA: Diagnosis present

## 2021-10-18 DIAGNOSIS — Z79899 Other long term (current) drug therapy: Secondary | ICD-10-CM

## 2021-10-18 DIAGNOSIS — D62 Acute posthemorrhagic anemia: Principal | ICD-10-CM | POA: Diagnosis present

## 2021-10-18 DIAGNOSIS — Z833 Family history of diabetes mellitus: Secondary | ICD-10-CM

## 2021-10-18 DIAGNOSIS — Z6841 Body Mass Index (BMI) 40.0 and over, adult: Secondary | ICD-10-CM

## 2021-10-18 DIAGNOSIS — Z7984 Long term (current) use of oral hypoglycemic drugs: Secondary | ICD-10-CM

## 2021-10-18 DIAGNOSIS — Z955 Presence of coronary angioplasty implant and graft: Secondary | ICD-10-CM

## 2021-10-18 DIAGNOSIS — D638 Anemia in other chronic diseases classified elsewhere: Secondary | ICD-10-CM | POA: Diagnosis present

## 2021-10-18 DIAGNOSIS — I110 Hypertensive heart disease with heart failure: Secondary | ICD-10-CM | POA: Diagnosis present

## 2021-10-18 DIAGNOSIS — Z20822 Contact with and (suspected) exposure to covid-19: Secondary | ICD-10-CM | POA: Diagnosis present

## 2021-10-18 DIAGNOSIS — Z823 Family history of stroke: Secondary | ICD-10-CM

## 2021-10-18 DIAGNOSIS — J9610 Chronic respiratory failure, unspecified whether with hypoxia or hypercapnia: Secondary | ICD-10-CM | POA: Diagnosis present

## 2021-10-18 DIAGNOSIS — Z87891 Personal history of nicotine dependence: Secondary | ICD-10-CM

## 2021-10-18 DIAGNOSIS — F419 Anxiety disorder, unspecified: Secondary | ICD-10-CM | POA: Diagnosis present

## 2021-10-18 DIAGNOSIS — I2510 Atherosclerotic heart disease of native coronary artery without angina pectoris: Secondary | ICD-10-CM | POA: Diagnosis present

## 2021-10-18 DIAGNOSIS — E118 Type 2 diabetes mellitus with unspecified complications: Secondary | ICD-10-CM

## 2021-10-18 LAB — CBC
HCT: 24.4 % — ABNORMAL LOW (ref 36.0–46.0)
Hemoglobin: 7.3 g/dL — ABNORMAL LOW (ref 12.0–15.0)
MCH: 24.4 pg — ABNORMAL LOW (ref 26.0–34.0)
MCHC: 29.9 g/dL — ABNORMAL LOW (ref 30.0–36.0)
MCV: 81.6 fL (ref 80.0–100.0)
Platelets: 337 10*3/uL (ref 150–400)
RBC: 2.99 MIL/uL — ABNORMAL LOW (ref 3.87–5.11)
RDW: 16 % — ABNORMAL HIGH (ref 11.5–15.5)
WBC: 13.5 10*3/uL — ABNORMAL HIGH (ref 4.0–10.5)
nRBC: 0 % (ref 0.0–0.2)

## 2021-10-18 LAB — ABO/RH: ABO/RH(D): O POS

## 2021-10-18 LAB — BASIC METABOLIC PANEL
Anion gap: 11 (ref 5–15)
BUN: 14 mg/dL (ref 6–20)
CO2: 23 mmol/L (ref 22–32)
Calcium: 8.2 mg/dL — ABNORMAL LOW (ref 8.9–10.3)
Chloride: 99 mmol/L (ref 98–111)
Creatinine, Ser: 1.16 mg/dL — ABNORMAL HIGH (ref 0.44–1.00)
GFR, Estimated: 55 mL/min — ABNORMAL LOW (ref >60)
Glucose, Bld: 381 mg/dL — ABNORMAL HIGH (ref 70–99)
Potassium: 3.8 mmol/L (ref 3.5–5.1)
Sodium: 133 mmol/L — ABNORMAL LOW (ref 135–145)

## 2021-10-18 LAB — BRAIN NATRIURETIC PEPTIDE: B Natriuretic Peptide: 73.1 pg/mL (ref 0.0–100.0)

## 2021-10-18 LAB — TROPONIN I (HIGH SENSITIVITY)
Troponin I (High Sensitivity): 6 ng/L (ref <18)
Troponin I (High Sensitivity): 5 ng/L (ref <18)

## 2021-10-18 LAB — POC OCCULT BLOOD, ED: Fecal Occult Bld: POSITIVE — AB

## 2021-10-18 LAB — PROTIME-INR
INR: 1.1 (ref 0.8–1.2)
Prothrombin Time: 14.2 seconds (ref 11.4–15.2)

## 2021-10-18 LAB — PREPARE RBC (CROSSMATCH)

## 2021-10-18 MED ORDER — SODIUM CHLORIDE 0.9 % IV BOLUS
500.0000 mL | Freq: Once | INTRAVENOUS | Status: AC
Start: 2021-10-18 — End: 2021-10-18
  Administered 2021-10-18: 500 mL via INTRAVENOUS

## 2021-10-18 MED ORDER — SODIUM CHLORIDE 0.9 % IV SOLN
10.0000 mL/h | Freq: Once | INTRAVENOUS | Status: AC
  Administered 2021-10-19: 10 mL/h via INTRAVENOUS

## 2021-10-18 NOTE — H&P (Addendum)
[facility] Admission History and Physical Service Pager: [phone]  Patient name: Brenda [name] Medical record number: [id number] Date of birth: 12-01-1960 Age: 61 y.o. Gender: female  Primary Care Provider: Charlott Rakes, MD Consultants: Cardiology Code Status: DNI Preferred Emergency Contact: [phone], Brenda [name], Husband  Chief Complaint: Chest Pain ; Symptomatic Anemia  Assessment and Plan: Brenda [name] is a 61 y.o. female presenting with non-exertional chest pain also found to have acute on chronic anemia. PMH is significant for CAD s/p remote LAD PCI and anterior STEMI in 2021 s/p PCI of mid-LAD, DM2, HFpEF, OSA on CPAP, HTN, HLD, Class III Obesity, chronic respiratory failure on 2L O2, anxiety and depression.   Exertional Chest Pain  Patient presenting with now exertional chest pain that was not responsive to nitroglycerin at home. CP did improve with third dose of nitro and ASA administered by EMS. Vitals on admission significant for mild tachycardia to 103 that quickly resolved and low distolic BPs to the 95M/84X. Admission labs remarkable for Hgb 7.3, slight leukocytosis to 13.5, troponin 6>5, BNP 73.1. CXR with no acute process. EKG without evidence of ischemia. Pt with history of MI (s/p LAD PCI) and heart failure. She has 1+ pitting edema to the knee. Evaluated by cardiology in the ED who felt that there was no indication for inpatient coronary evaluation at this time.  In the absence of obvious ischemic causes, the most likely cause of her CP is symptomatic anemia secondary to her ongoing vaginal bleeding as discussed below. Other items on the differential include ACS given her cardiac history, though this is much less likely given troponin WNL and no ischemic changes on EKG. Also considered infectious process given slight leukocytosis but no fever or evidence of pneumonia or pneumothorax on CXR.  - Admit to cardiac-tele, Dr.  Erin [name] attending - Tylenol PRN for pain - Cardiac monitoring - Will need outpatient cardiology follow-up - No VTE ppx in setting of acute bleed - PT and OT eval and treat  - Echo   Acute Blood Loss Anemia Chronic Iron Deficiency Anemia Vaginal Bleeding Endometrial Thickening noted on Pelvic US PCOS Patient has a history of microcytic anemia with a baseline around 9. Hgb today is 7.3. MCV is actually wnl today at 81.6. INR 1.1. FOBT obtained in the ED was positive, consider cross-contamination. She is currently receiving 1u PRBC in the ED.  Ferritin was 6 eight months ago, patient has not been taking her iron supplementation due to issues with constipation.  She has never had a colonoscopy. Has a 2 year history of vaginal bleeding that has picked up notably in the past 4 days. She has not yet been seen by gynecology, has an appt with Dr. Rip [name] tomorrow morning. She did have a pelvic US that was ordered by her PCP in November that showed Endometrial thickness of 27.45mm.  - Currently receiving 1u PRBC - F/u post-transfusion H&H - transfusion threshold <8 - Consult to gynecology in the morning - AM CBC - Consider Megace  - She will need outpatient colonoscopy  CAD s/p remote LAD PCI   Anterior STEMI (2021) s/p PCI of mid-LAD HFpEF   Chronic Hypertension She is on Effient and ASA at home. Home HF meds include Entresto 24-26mg , Lasix 40mg  daily, and Jardiance 10mg  daily. Her last echo was in June of 2022 and showed normal LV function with moderately reduced RV function and mild enlargement of the RV. She does endorse BLE edema that is worse now  compared to her baseline. Also with a new murmur at RUSB which may just represent a flow murmur in the setting of marked anemia, but warrants further workup with echocardiogram.  - Holding Entresto, Lasix, and Jardiance in the setting of low BP, restart as able - Holding Prasugrel in the setting of active blood loss - Continue ASA for now  -  Consider echocardiogram  Prolonged QTc  QTc 520 on admission EKG. - Avoid QT-prolonging meds - K >4, Mg >2  T2DM Hgb 8.0 two weeks ago. Home regimen includes levemir 42P BID, Trulicity 1.5mg  weekly, Jardiance 10mg  daily. This is a new regimen for her as she was previously using OTC regular insulin and 70/30. She has noticed higher-than-usual CBGs at home.  - Levemir 12 u BID - CBGs/SSI - Carb-modified diet - Continue home gabapentin for neuropathy  OSA on CPAP   Chronic Respiratory Failure On 2L at home.  - Supplemental O2 as needed - CPAP nightly  HLD Last lipid panel with total cholesterol 194, LDL 113, HDL 49.  - Continue home atorvastatin  Anxiety and Depression - Continue home Prozac  FEN/GI: Carb-modified heart healthy Prophylaxis: SCDs, hold off on chemoppx given active blood loss  Disposition: Cardiac Tele  History of Present Illness:  Brenda [name] is a 61 y.o. female presenting with 2 weeks of non-exertional CP but now exertional that resolved with nitroglycerin. She took NG for the first few episodes of chest pain at home which made pain feel better. She characterizes this chest pain as somewhat different from her STEMI in 2021 as it is "higher" up in her chest.   Last night she got nauseous. Endorsing increasing shortness of breath, fatigue, generalized weakness. Wears oxygen, 2L at home. Typically she can make the trip to and from the bathroom without her O2 but the last few days she has become acutely short of breath and developed chest pain when she tries to ambulate without her O2. Last night she felt like she was going to pass out. She was supposed to have a cardiology appointment last week but re-scheduled after her husband's mother passed away.    She has had persistent vaginal bleeding for the past 2 years. States it has never stopped in the past 2 years. The bleeding was relatively steady and she had to wear a pad constantly but four days ago it got  "really really heavy."  When she stands it pools in the floor and clots now baseball sized for the past 4 days. She clogged the toilet with a clot. Having uterine cramps.  She has been "wearing 2 and 3 pads". Her PCP previously ordered a pelvic US which showed thickened endometrial stripe to 37mm but she had not yet been to a gynecologist due to insurance issues. She has a GYN appointment tomorrow morning with Dr. Rip [name]. She has taken iron for the past 3 days.    She switched over insulin last week. She went from taking OTC NPH to Leveimer and Regular insulin.  Has the pharmacy card from the [facility]. [facility].     Review Of Systems:   Review of Systems  Constitutional:  Positive for fatigue.  HENT:  Negative for rhinorrhea and sore throat.   Eyes:        Occasionally blurry vision   Respiratory:  Positive for shortness of breath. Negative for cough.   Cardiovascular:  Positive for chest pain.  Gastrointestinal:  Positive for nausea. Negative for blood in  stool.  Genitourinary:  Positive for vaginal bleeding.  Neurological:  Positive for weakness and light-headedness. Negative for syncope.  All other systems reviewed and are negative.   Patient Active Problem List   Diagnosis Date Noted   Chronic respiratory failure ([place]) 03/22/2021   (HFpEF) heart failure with preserved ejection fraction ([place]) 02/09/2021   Old MI (myocardial infarction) 10/26/2019   Anxiety and depression 03/28/2016   Type 2 diabetes mellitus with complication, with long-term current use of insulin ([place]) 02/23/2015   Morbid obesity ([place]) 02/22/2015   Dyspnea 03/11/2013   Hyperlipidemia associated with type 2 diabetes mellitus ([place]) 05/25/2009   Coronary Artery Disease 05/25/2009   Essential hypertension 05/24/2009   OSA treated with BiPAP 05/24/2009    Past Medical History: Past Medical History:  Diagnosis Date   Arthritis    "back, hands, hips" (02/22/2015)   CAD (coronary  artery disease)    a. complex LAD/diagonal bifurcation PCI in 2010. b. STEMI 10/2019 s/p PTCA/DES x1 to mLAD overlapping the old stent, residual disease treated medicaly.   Depression    Diabetes mellitus ([place])    "started when I was pregnant; not sure if it was type 1 or type 2"    Hypercholesterolemia    Hypertension    Morbid obesity ([place])    Sleep apnea    "suppose to wear mask; I don't" (02/22/2015)    Past Surgical History: Past Surgical History:  Procedure Laterality Date   CARDIAC CATHETERIZATION  12/2012   CHOLECYSTECTOMY OPEN  1989   CORONARY ANGIOPLASTY WITH STENT PLACEMENT  2011   CORONARY/GRAFT ACUTE MI REVASCULARIZATION N/A 10/26/2019   Procedure: CORONARY/GRAFT ACUTE MI REVASCULARIZATION;  Surgeon: Burnell Blanks, MD;  Location: [facility];  Service: Cardiovascular;  Laterality: N/A;   HERNIA REPAIR     LEFT HEART CATH AND CORONARY ANGIOGRAPHY N/A 10/26/2019   Procedure: LEFT HEART CATH AND CORONARY ANGIOGRAPHY;  Surgeon: Burnell Blanks, MD;  Location: [facility];  Service: Cardiovascular;  Laterality: N/A;   LEFT HEART CATHETERIZATION WITH CORONARY ANGIOGRAM N/A 12/25/2012   Procedure: LEFT HEART CATHETERIZATION WITH CORONARY ANGIOGRAM;  Surgeon: Sherren Mocha, MD;  Location: [facility] CATH LAB;  Service: Cardiovascular;  Laterality: N/A;   UMBILICAL HERNIA REPAIR  1990's    Social History: Social History   Tobacco Use   Smoking status: Former    Packs/day: 0.50    Years: 1.00    Pack years: 0.50    Types: Cigarettes, Cigars    Quit date: 09/03/1986    Years since quitting: 35.1   Smokeless tobacco: Never  Substance Use Topics   Alcohol use: No   Drug use: No   Additional social history:   Please also refer to relevant sections of EMR.  Family History: Family History  Problem Relation Age of Onset   Heart attack Father        in his mid 71s   COPD Father    Coronary artery disease Mother        in her mid 57s   Hypertension  Mother    Cancer Mother    Diabetes Sister    Coronary artery disease Sister        valve replacement   Stroke Sister        multiple    Allergies and Medications: Allergies  Allergen Reactions   Hydrocodone Shortness Of Breath    "I forget to breathe."    Clopidogrel Bisulfate     REACTION: rash  Hydrocodeine [Dihydrocodeine]    No current facility-administered medications on file prior to encounter.   Current Outpatient Medications on File Prior to Encounter  Medication Sig Dispense Refill   aspirin 81 MG EC tablet Take 1 tablet (81 mg total) by mouth daily. 30 tablet 11   atorvastatin (LIPITOR) 80 MG tablet Take 1 tablet (80 mg total) by mouth every evening. 30 tablet 6   Blood Glucose Monitoring Suppl (TRUE METRIX METER) DEVI 1 each by Does not apply route 3 (three) times daily. 1 Device 0   Dulaglutide (TRULICITY) 1.5 CH/8.5ID SOPN Inject 1.5 mg into the skin once a week. 2 mL 6   empagliflozin (JARDIANCE) 10 MG TABS tablet Take 1 tablet (10 mg total) by mouth daily. 30 tablet 6   FLUoxetine (PROZAC) 20 MG capsule Take 1 capsule (20 mg total) by mouth daily. 30 capsule 6   furosemide (LASIX) 40 MG tablet Take 1 tablet (40 mg total) by mouth daily. 30 tablet 6   gabapentin (NEURONTIN) 300 MG capsule Take 1 capsule (300 mg total) by mouth at bedtime. 30 capsule 6   glucose blood (TRUE METRIX BLOOD GLUCOSE TEST) test strip Use as instructed 100 each 5   insulin detemir (LEVEMIR FLEXTOUCH) 100 UNIT/ML FlexPen Inject 22 Units into the skin 2 (two) times daily. 30 mL 6   Insulin Pen Needle (NOVOFINE) 30G X 8 MM MISC Use as directed with Novolin 70/30 insulin 100 each 1   Insulin Syringe-Needle U-100 30G X 1/2" 0.3 ML MISC use as directed 5 (five) times daily with insulin 150 each 2   nitroGLYCERIN (NITROSTAT) 0.4 MG SL tablet Place 1 tablet (0.4 mg total) under the tongue every 5 (five) minutes as needed for chest pain (up to 3 doses. If taking 3rd dose call 911). 25 tablet 3    prasugrel (EFFIENT) 10 MG TABS tablet Take 1 tablet (10 mg total) by mouth daily. Pt needs to make appt with provider for further refills - 1st attempt 30 tablet 1   sacubitril-valsartan (ENTRESTO) 24-26 MG Take 1 tablet by mouth 2 (two) times daily. 60 tablet 11   TRUEPLUS LANCETS 30G MISC 1 each by Does not apply route 3 (three) times daily. 100 each 5    Objective: BP (!) 100/37    Pulse 77    Temp 98.2 F (36.8 C) (Oral)    Resp (!) 26    Ht 5\' 1"  (1.549 m)    Wt (!) 140.6 kg    SpO2 100%    BMI 58.57 kg/m  Exam: General: Pale, NAD Eyes: EOMs intact ENTM: MMM Neck: Without mass Cardiovascular: RRR, II/VI systolic murmur at RUSB Respiratory: Normal WOB on 2L, lungs clear to auscultation Gastrointestinal: Obese, non-tender, non-distended MSK: 1+ pitting edema to knees BLE Derm: Androgenic hair patterns  Neuro: Able to ambulate, no focal deficit Psych: Mood and affect appropriate  Labs and Imaging: CBC BMET  Recent Labs  Lab 10/18/21 2000  WBC 13.5*  HGB 7.3*  HCT 24.4*  PLT 337   Recent Labs  Lab 10/18/21 2000  NA 133*  K 3.8  CL 99  CO2 23  BUN 14  CREATININE 1.16*  GLUCOSE 381*  CALCIUM 8.2*     EKG: Sinus tachycardia, wide QRS (?RBBB), PVC, QTc 520  DG Chest Port 1 View CLINICAL DATA:  Chest pain  EXAM: PORTABLE CHEST 1 VIEW  COMPARISON:  10/03/2021  FINDINGS: Single frontal view of the chest demonstrates chronic elevation of the right hemidiaphragm. Cardiac  silhouette is stable. No airspace disease, effusion, or pneumothorax. No acute bony abnormality.  IMPRESSION: 1. Stable chest, no acute process.  Electronically Signed   By: Randa Ngo M.D.   On: 10/18/2021 20:32    Eppie Gibson, MD 10/18/2021, 10:54 PM PGY-1, [facility] Intern pager: [phone], text pages welcome  FPTS Upper-Level Resident Addendum   I have independently interviewed and examined the patient. I have discussed the above with the original  author and agree with their documentation. My edits for correction/addition/clarification are in within the document. Please see also any attending notes.   Lyndee Hensen, DO PGY-3, [facility] 10/19/2021 1:43 AM  FPTS Service pager: [phone] (text pages welcome through [facility])

## 2021-10-18 NOTE — ED Notes (Signed)
Blood consent at bedside and signed - pt back from restroom - Admitting providers at bedside

## 2021-10-18 NOTE — ED Notes (Signed)
Pt ambulatory to restroom

## 2021-10-18 NOTE — ED Notes (Signed)
Dr Butler at bedside.  

## 2021-10-18 NOTE — ED Notes (Signed)
Pt laying on side - pt repositioned on back and vital signs obtained - pt states that she is having no CP at this time

## 2021-10-18 NOTE — ED Triage Notes (Signed)
Pt BIB EMS from home - Pt has had intermittent chest pain for the last week - stressful event last week as her mother in law passed away. Pt has been taking nitro at home and it usually helps but pt took two today and they did not help. EMS gave 324 asa and 1 nitro - BP with EMS 80 systolic - fluid bolus given. Pt with no dizziness.  Pt has hx of 3 MI and stents placed.  12 lead unremarkable with EMS

## 2021-10-18 NOTE — ED Provider Notes (Signed)
[facility] EMERGENCY DEPARTMENT Provider Note   CSN: [id number] Arrival date & time: 10/18/21  1952     History  Chief Complaint  Patient presents with   Chest Pain   Hypotension    Brenda [name] is a 61 y.o. female.  She has a history of coronary disease STEMI and multiple cardiac risk factors.  She said over the past 2 weeks she has had 3 episodes of chest pain.  They usually resolve with nitroglycerin.  Today she had another episode of 7 out of 10 left-sided chest pain radiating to her left shoulder and neck.  She took 2 nitro but it did not completely resolved.  She feels a little short of breath but she feels it is at baseline.  She think she has had more edema on her legs.  More stress recently as her mother-in-law just passed away.  Chest pain is resolved after third nitro by EMS.  She also took 324 of aspirin  HPI  HPI: A 61 year old patient with a history of treated diabetes, hypertension, hypercholesterolemia and obesity presents for evaluation of chest pain. Initial onset of pain was approximately 1-3 hours ago. The patient's chest pain is described as heaviness/pressure/tightness, is not worse with exertion and is relieved by nitroglycerin. The patient's chest pain is middle- or left-sided, is not well-localized, is not sharp and does radiate to the arms/jaw/neck. The patient does not complain of nausea and denies diaphoresis. The patient has smoked in the past 90 days. The patient has no history of stroke, has no history of peripheral artery disease and has no relevant family history of coronary artery disease (first degree relative at less than age 25).   Home Medications Prior to Admission medications   Medication Sig Start Date End Date Taking? Authorizing Provider  aspirin 81 MG EC tablet Take 1 tablet (81 mg total) by mouth daily. 02/14/21   Mosetta Anis, MD  atorvastatin (LIPITOR) 80 MG tablet Take 1 tablet (80 mg total) by mouth every evening.  10/03/21   Charlott Rakes, MD  Blood Glucose Monitoring Suppl (TRUE METRIX METER) DEVI 1 each by Does not apply route 3 (three) times daily. 02/28/15   Charlott Rakes, MD  Dulaglutide (TRULICITY) 1.5 ZJ/6.9CV SOPN Inject 1.5 mg into the skin once a week. 10/03/21   Charlott Rakes, MD  empagliflozin (JARDIANCE) 10 MG TABS tablet Take 1 tablet (10 mg total) by mouth daily. 10/03/21   Charlott Rakes, MD  FLUoxetine (PROZAC) 20 MG capsule Take 1 capsule (20 mg total) by mouth daily. 10/03/21   Charlott Rakes, MD  furosemide (LASIX) 40 MG tablet Take 1 tablet (40 mg total) by mouth daily. 06/26/21   Charlott Rakes, MD  gabapentin (NEURONTIN) 300 MG capsule Take 1 capsule (300 mg total) by mouth at bedtime. 10/03/21   Charlott Rakes, MD  glucose blood (TRUE METRIX BLOOD GLUCOSE TEST) test strip Use as instructed 02/28/15   Charlott Rakes, MD  insulin detemir (LEVEMIR FLEXTOUCH) 100 UNIT/ML FlexPen Inject 22 Units into the skin 2 (two) times daily. 10/03/21   Charlott Rakes, MD  Insulin Pen Needle (NOVOFINE) 30G X 8 MM MISC Use as directed with Novolin 70/30 insulin 10/28/19   Dunn, Dayna N, PA-C  Insulin Syringe-Needle U-100 30G X 1/2" 0.3 ML MISC use as directed 5 (five) times daily with insulin 02/14/21   Armando Reichert, MD  nitroGLYCERIN (NITROSTAT) 0.4 MG SL tablet Place 1 tablet (0.4 mg total) under the tongue every 5 (five) minutes  as needed for chest pain (up to 3 doses. If taking 3rd dose call 911). 03/22/21   Charlott Rakes, MD  prasugrel (EFFIENT) 10 MG TABS tablet Take 1 tablet (10 mg total) by mouth daily. Pt needs to make appt with provider for further refills - 1st attempt 02/21/21   Katherine Roan, MD  sacubitril-valsartan (ENTRESTO) 24-26 MG Take 1 tablet by mouth 2 (two) times daily. 02/21/21   Katherine Roan, MD  TRUEPLUS LANCETS 30G MISC 1 each by Does not apply route 3 (three) times daily. 02/28/15   Charlott Rakes, MD      Allergies    Hydrocodone, Clopidogrel bisulfate, and  Hydrocodeine [dihydrocodeine]    Review of Systems   Review of Systems  Constitutional:  Negative for fever.  HENT:  Negative for sore throat.   Eyes:  Negative for visual disturbance.  Respiratory:  Positive for shortness of breath.   Cardiovascular:  Positive for chest pain and leg swelling.  Gastrointestinal:  Negative for abdominal pain.  Genitourinary:  Negative for dysuria.  Musculoskeletal:  Negative for joint swelling.  Skin:  Negative for rash.  Neurological:  Negative for headaches.   Physical Exam Updated Vital Signs BP (!) 112/46    Pulse 99    Temp 98.2 F (36.8 C) (Oral)    Resp 15    Ht 5\' 1"  (1.549 m)    Wt (!) 140.6 kg    SpO2 100%    BMI 58.57 kg/m  Physical Exam Vitals and nursing note reviewed.  Constitutional:      General: She is not in acute distress.    Appearance: She is well-developed.  HENT:     Head: Normocephalic and atraumatic.  Eyes:     Conjunctiva/sclera: Conjunctivae normal.  Cardiovascular:     Rate and Rhythm: Normal rate and regular rhythm.     Heart sounds: Normal heart sounds. No murmur heard. Pulmonary:     Effort: Pulmonary effort is normal. No respiratory distress.     Breath sounds: Normal breath sounds.  Abdominal:     Palpations: Abdomen is soft.     Tenderness: There is no abdominal tenderness. There is no guarding or rebound.  Musculoskeletal:        General: No swelling.     Cervical back: Neck supple.     Right lower leg: No tenderness. Edema present.     Left lower leg: No tenderness. Edema present.     Comments: She has trace edema bilaterally and no cords appreciated  Skin:    General: Skin is warm and dry.     Capillary Refill: Capillary refill takes less than 2 seconds.  Neurological:     General: No focal deficit present.     Mental Status: She is alert.    ED Results / Procedures / Treatments   Labs (all labs ordered are listed, but only abnormal results are displayed) Labs Reviewed  BASIC METABOLIC PANEL  - Abnormal; Notable for the following components:      Result Value   Sodium 133 (*)    Glucose, Bld 381 (*)    Creatinine, Ser 1.16 (*)    Calcium 8.2 (*)    GFR, Estimated 55 (*)    All other components within normal limits  CBC - Abnormal; Notable for the following components:   WBC 13.5 (*)    RBC 2.99 (*)    Hemoglobin 7.3 (*)    HCT 24.4 (*)    MCH 24.4 (*)  MCHC 29.9 (*)    RDW 16.0 (*)    All other components within normal limits  COMPREHENSIVE METABOLIC PANEL - Abnormal; Notable for the following components:   Glucose, Bld 107 (*)    Calcium 8.6 (*)    Albumin 3.2 (*)    Total Bilirubin 0.2 (*)    All other components within normal limits  CBC - Abnormal; Notable for the following components:   WBC 14.8 (*)    RBC 2.74 (*)    Hemoglobin 6.7 (*)    HCT 22.4 (*)    MCH 24.5 (*)    MCHC 29.9 (*)    RDW 16.2 (*)    Platelets 412 (*)    All other components within normal limits  HEMOGLOBIN AND HEMATOCRIT, BLOOD - Abnormal; Notable for the following components:   Hemoglobin 7.0 (*)    HCT 23.6 (*)    All other components within normal limits  POC OCCULT BLOOD, ED - Abnormal; Notable for the following components:   Fecal Occult Bld POSITIVE (*)    All other components within normal limits  CBG MONITORING, ED - Abnormal; Notable for the following components:   Glucose-Capillary 36 (*)    All other components within normal limits  CBG MONITORING, ED - Abnormal; Notable for the following components:   Glucose-Capillary 147 (*)    All other components within normal limits  CBG MONITORING, ED - Abnormal; Notable for the following components:   Glucose-Capillary 104 (*)    All other components within normal limits  RESP PANEL BY RT-PCR (FLU A&B, COVID) ARPGX2  BRAIN NATRIURETIC PEPTIDE  PROTIME-INR  TSH  MAGNESIUM  TYPE AND SCREEN  ABO/RH  PREPARE RBC (CROSSMATCH)  PREPARE RBC (CROSSMATCH)  TROPONIN I (HIGH SENSITIVITY)  TROPONIN I (HIGH SENSITIVITY)     EKG EKG Interpretation  Date/Time:  Wednesday October 18 2021 19:57:57 EST Ventricular Rate:  102 PR Interval:  179 QRS Duration: 149 QT Interval:  399 QTC Calculation: 520 R Axis:   -87 Text Interpretation: Sinus tachycardia Ventricular premature complex Right bundle branch block Anterolateral infarct, age indeterminate No significant change since prior 6/22 Confirmed by Aletta Edouard [phone]) on 10/18/2021 8:02:43 PM  Radiology DG Chest Port 1 View  Result Date: 10/18/2021 CLINICAL DATA:  Chest pain EXAM: PORTABLE CHEST 1 VIEW COMPARISON:  10/03/2021 FINDINGS: Single frontal view of the chest demonstrates chronic elevation of the right hemidiaphragm. Cardiac silhouette is stable. No airspace disease, effusion, or pneumothorax. No acute bony abnormality. IMPRESSION: 1. Stable chest, no acute process. Electronically Signed   By: Randa Ngo M.D.   On: 10/18/2021 20:32    Procedures .Critical Care Performed by: Hayden Rasmussen, MD Authorized by: Hayden Rasmussen, MD   Critical care provider statement:    Critical care time (minutes):  45   Critical care time was exclusive of:  Separately billable procedures and treating other patients   Critical care was necessary to treat or prevent imminent or life-threatening deterioration of the following conditions:  Circulatory failure   Critical care was time spent personally by me on the following activities:  Development of treatment plan with patient or surrogate, discussions with consultants, evaluation of patient's response to treatment, examination of patient, obtaining history from patient or surrogate, ordering and performing treatments and interventions, ordering and review of laboratory studies, ordering and review of radiographic studies, pulse oximetry, re-evaluation of patient's condition and review of old charts   I assumed direction of critical care for this patient from  another provider in my specialty: no       Medications Ordered in ED Medications  atorvastatin (LIPITOR) tablet 80 mg (has no administration in time range)  FLUoxetine (PROZAC) capsule 20 mg (has no administration in time range)  gabapentin (NEURONTIN) capsule 300 mg (300 mg Oral Given 10/19/21 0103)  insulin aspart (novoLOG) injection 0-15 Units (0 Units Subcutaneous Not Given 10/19/21 0753)  insulin detemir (LEVEMIR) injection 12 Units (0 Units Subcutaneous Hold 10/19/21 0319)  acetaminophen (TYLENOL) tablet 650 mg (has no administration in time range)    Or  acetaminophen (TYLENOL) suppository 650 mg (has no administration in time range)  0.9 %  sodium chloride infusion (Manually program via Guardrails IV Fluids) (has no administration in time range)  aspirin EC tablet 81 mg (has no administration in time range)  megestrol (MEGACE) tablet 40 mg (has no administration in time range)  sodium chloride 0.9 % bolus 500 mL (0 mLs Intravenous Stopped 10/18/21 2210)  0.9 %  sodium chloride infusion (0 mL/hr Intravenous Stopped 10/19/21 0345)  dextrose 50 % solution (50 mLs  Given 10/19/21 0325)    ED Course/ Medical Decision Making/ A&P Clinical Course as of 10/19/21 0174  Wed Oct 18, 2021  2035 Chest x-ray interpreted by me as cardiomegaly elevated right hemidiaphragm which appears old from prior 2 weeks ago.  Awaiting radiology reading. [MB]  2040 Hemoglobin down 2 points from her prior.  Patient states she is been having irregular bleeding for the last 2 years and has an appointment with gynecology coming up.  She denies any rectal bleeding.  Guaiac done with nurse chaperone.  Normal tone no masses sample sent to lab for guaiac. [MB]  2213 Discussed with cardiology consultant Dr. Kalman Shan.  He agrees that transfusion would probably be the most helpful for her chest pain. [MB]  2241 Discussed with family resident teaching service who will evaluate the patient for admission. [MB]    Clinical Course User Index [MB] Hayden Rasmussen, MD    HEAR Score: 6                       Medical Decision Making Amount and/or Complexity of Data Reviewed Labs: ordered. Radiology: ordered.  Risk Decision regarding hospitalization.  This patient complains of intermittent chest pain, dysfunctional uterine bleeding low blood pressure; this involves an extensive number of treatment Options and is a complaint that carries with it a high risk of complications and morbidity. The differential includes shock, anemia, ACS, GI bleed, vaginal bleeding, arrhythmia  I ordered, reviewed and interpreted labs, which included CBC with elevated white count low hemoglobin, chemistries fairly unremarkable, troponins flat, fecal occult positive, BNP normal I ordered medication IV fluids transfusion of packed red blood cells and reviewed PMP when indicated. I ordered imaging studies which included chest x-ray and I independently    visualized and interpreted imaging which showed no acute findings Additional history obtained from patient's husband Previous records obtained and reviewed in epic including prior cardiology notes. I consulted cardiology Dr. Jake Seats and family practice teaching service and discussed lab and imaging findings and discussed disposition.  Cardiac monitoring reviewed, normal sinus rhythm Social determinants considered, patient under significant stressors at home.  Critical Interventions: Transfusion of packed red blood cells for her symptomatic anemia and intermittent hypotension  After the interventions stated above, I reevaluated the patient and found patient be symptomatically improved. Admission and further testing considered, she will need admission to the hospital for further evaluation and  transfusions.  Patient in agreement with plan for admission.          Final Clinical Impression(s) / ED Diagnoses Final diagnoses:  Nonspecific chest pain  Symptomatic anemia  Dysfunctional uterine bleeding    Rx / DC Orders ED  Discharge Orders     None         Hayden Rasmussen, MD 10/19/21 [phone]

## 2021-10-18 NOTE — Care Plan (Signed)
Patient discussed with Dr. Melina Copa.   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 At that time she also had obstructive disease of previously stented diag that was treated medically. She presented to the ED today for 3-4 episodes of non-exertional chest pain over the last couple of weeks that improved after taking nitroglycerine, though the episode today was more prolonged and required a third dose of nitroglycerin by EMS. Her ECG on arrival is unchanged from baseline, troponin is < 10, and she is pain free. Her hemoglobin is 7.3 in the setting of ongoing vaginal bleeding for which she has not yet seen gynecology. FOBT is also positive.   For now, she needs transfusion as well as treatment and evaluation for her acute on chronic anemia. MI has been ruled out and she is a very poor candidate for invasive evaluation at this time. Please re-engage cardiology prior to discharge if she continues to have chest pain following treatment of her anemia, otherwise there is no indication for inpatient coronary evaluation at this time. Needs to follow up with cardiology outpatient.

## 2021-10-19 ENCOUNTER — Encounter: Payer: Medicaid Other | Admitting: Obstetrics and Gynecology

## 2021-10-19 ENCOUNTER — Encounter (HOSPITAL_COMMUNITY): Payer: Self-pay | Admitting: Family Medicine

## 2021-10-19 DIAGNOSIS — Z7985 Long-term (current) use of injectable non-insulin antidiabetic drugs: Secondary | ICD-10-CM | POA: Diagnosis not present

## 2021-10-19 DIAGNOSIS — R079 Chest pain, unspecified: Secondary | ICD-10-CM

## 2021-10-19 DIAGNOSIS — N938 Other specified abnormal uterine and vaginal bleeding: Secondary | ICD-10-CM | POA: Diagnosis present

## 2021-10-19 DIAGNOSIS — I2510 Atherosclerotic heart disease of native coronary artery without angina pectoris: Secondary | ICD-10-CM | POA: Diagnosis present

## 2021-10-19 DIAGNOSIS — Z6841 Body Mass Index (BMI) 40.0 and over, adult: Secondary | ICD-10-CM | POA: Diagnosis not present

## 2021-10-19 DIAGNOSIS — G4733 Obstructive sleep apnea (adult) (pediatric): Secondary | ICD-10-CM | POA: Diagnosis present

## 2021-10-19 DIAGNOSIS — F32A Depression, unspecified: Secondary | ICD-10-CM | POA: Diagnosis present

## 2021-10-19 DIAGNOSIS — D638 Anemia in other chronic diseases classified elsewhere: Secondary | ICD-10-CM | POA: Diagnosis present

## 2021-10-19 DIAGNOSIS — D649 Anemia, unspecified: Secondary | ICD-10-CM | POA: Diagnosis present

## 2021-10-19 DIAGNOSIS — F419 Anxiety disorder, unspecified: Secondary | ICD-10-CM | POA: Diagnosis present

## 2021-10-19 DIAGNOSIS — Z79899 Other long term (current) drug therapy: Secondary | ICD-10-CM | POA: Diagnosis not present

## 2021-10-19 DIAGNOSIS — Z87891 Personal history of nicotine dependence: Secondary | ICD-10-CM | POA: Diagnosis not present

## 2021-10-19 DIAGNOSIS — E1140 Type 2 diabetes mellitus with diabetic neuropathy, unspecified: Secondary | ICD-10-CM | POA: Diagnosis present

## 2021-10-19 DIAGNOSIS — J9610 Chronic respiratory failure, unspecified whether with hypoxia or hypercapnia: Secondary | ICD-10-CM | POA: Diagnosis present

## 2021-10-19 DIAGNOSIS — R0789 Other chest pain: Secondary | ICD-10-CM

## 2021-10-19 DIAGNOSIS — N950 Postmenopausal bleeding: Secondary | ICD-10-CM

## 2021-10-19 DIAGNOSIS — Z955 Presence of coronary angioplasty implant and graft: Secondary | ICD-10-CM | POA: Diagnosis not present

## 2021-10-19 DIAGNOSIS — I5032 Chronic diastolic (congestive) heart failure: Secondary | ICD-10-CM | POA: Diagnosis present

## 2021-10-19 DIAGNOSIS — Z66 Do not resuscitate: Secondary | ICD-10-CM | POA: Diagnosis not present

## 2021-10-19 DIAGNOSIS — Z794 Long term (current) use of insulin: Secondary | ICD-10-CM | POA: Diagnosis not present

## 2021-10-19 DIAGNOSIS — D62 Acute posthemorrhagic anemia: Secondary | ICD-10-CM | POA: Diagnosis present

## 2021-10-19 DIAGNOSIS — I252 Old myocardial infarction: Secondary | ICD-10-CM | POA: Diagnosis not present

## 2021-10-19 DIAGNOSIS — Z7982 Long term (current) use of aspirin: Secondary | ICD-10-CM | POA: Diagnosis not present

## 2021-10-19 DIAGNOSIS — Z7984 Long term (current) use of oral hypoglycemic drugs: Secondary | ICD-10-CM | POA: Diagnosis not present

## 2021-10-19 DIAGNOSIS — Z20822 Contact with and (suspected) exposure to covid-19: Secondary | ICD-10-CM | POA: Diagnosis present

## 2021-10-19 DIAGNOSIS — I110 Hypertensive heart disease with heart failure: Secondary | ICD-10-CM | POA: Diagnosis present

## 2021-10-19 DIAGNOSIS — Z9981 Dependence on supplemental oxygen: Secondary | ICD-10-CM | POA: Diagnosis not present

## 2021-10-19 LAB — HEMOGLOBIN AND HEMATOCRIT, BLOOD
HCT: 23.6 % — ABNORMAL LOW (ref 36.0–46.0)
HCT: 27.2 % — ABNORMAL LOW (ref 36.0–46.0)
Hemoglobin: 7 g/dL — ABNORMAL LOW (ref 12.0–15.0)
Hemoglobin: 8.5 g/dL — ABNORMAL LOW (ref 12.0–15.0)

## 2021-10-19 LAB — GLUCOSE, CAPILLARY
Glucose-Capillary: 269 mg/dL — ABNORMAL HIGH (ref 70–99)
Glucose-Capillary: 250 mg/dL — ABNORMAL HIGH (ref 70–99)

## 2021-10-19 LAB — COMPREHENSIVE METABOLIC PANEL
ALT: 23 U/L (ref 0–44)
AST: 33 U/L (ref 15–41)
Albumin: 3.2 g/dL — ABNORMAL LOW (ref 3.5–5.0)
Alkaline Phosphatase: 57 U/L (ref 38–126)
Anion gap: 12 (ref 5–15)
BUN: 12 mg/dL (ref 6–20)
CO2: 24 mmol/L (ref 22–32)
Calcium: 8.6 mg/dL — ABNORMAL LOW (ref 8.9–10.3)
Chloride: 103 mmol/L (ref 98–111)
Creatinine, Ser: 0.98 mg/dL (ref 0.44–1.00)
GFR, Estimated: >60 mL/min (ref >60)
Glucose, Bld: 107 mg/dL — ABNORMAL HIGH (ref 70–99)
Potassium: 3.6 mmol/L (ref 3.5–5.1)
Sodium: 139 mmol/L (ref 135–145)
Total Bilirubin: 0.2 mg/dL — ABNORMAL LOW (ref 0.3–1.2)
Total Protein: 6.5 g/dL (ref 6.5–8.1)

## 2021-10-19 LAB — CBG MONITORING, ED
Glucose-Capillary: 36 mg/dL — CL (ref 70–99)
Glucose-Capillary: 147 mg/dL — ABNORMAL HIGH (ref 70–99)
Glucose-Capillary: 104 mg/dL — ABNORMAL HIGH (ref 70–99)
Glucose-Capillary: 109 mg/dL — ABNORMAL HIGH (ref 70–99)

## 2021-10-19 LAB — CBC
HCT: 22.4 % — ABNORMAL LOW (ref 36.0–46.0)
HCT: 27 % — ABNORMAL LOW (ref 36.0–46.0)
Hemoglobin: 6.7 g/dL — CL (ref 12.0–15.0)
Hemoglobin: 8.6 g/dL — ABNORMAL LOW (ref 12.0–15.0)
MCH: 24.5 pg — ABNORMAL LOW (ref 26.0–34.0)
MCH: 25.4 pg — ABNORMAL LOW (ref 26.0–34.0)
MCHC: 29.9 g/dL — ABNORMAL LOW (ref 30.0–36.0)
MCHC: 31.9 g/dL (ref 30.0–36.0)
MCV: 81.8 fL (ref 80.0–100.0)
MCV: 79.9 fL — ABNORMAL LOW (ref 80.0–100.0)
Platelets: 412 10*3/uL — ABNORMAL HIGH (ref 150–400)
Platelets: 308 10*3/uL (ref 150–400)
RBC: 2.74 MIL/uL — ABNORMAL LOW (ref 3.87–5.11)
RBC: 3.38 MIL/uL — ABNORMAL LOW (ref 3.87–5.11)
RDW: 16.2 % — ABNORMAL HIGH (ref 11.5–15.5)
RDW: 16.6 % — ABNORMAL HIGH (ref 11.5–15.5)
WBC: 14.8 10*3/uL — ABNORMAL HIGH (ref 4.0–10.5)
WBC: 14.5 10*3/uL — ABNORMAL HIGH (ref 4.0–10.5)
nRBC: 0.2 % (ref 0.0–0.2)
nRBC: 0.2 % (ref 0.0–0.2)

## 2021-10-19 LAB — RESP PANEL BY RT-PCR (FLU A&B, COVID) ARPGX2
Influenza A by PCR: NEGATIVE
Influenza B by PCR: NEGATIVE
SARS Coronavirus 2 by RT PCR: NEGATIVE

## 2021-10-19 LAB — PREPARE RBC (CROSSMATCH)

## 2021-10-19 LAB — TSH: TSH: 2.827 u[IU]/mL (ref 0.350–4.500)

## 2021-10-19 LAB — MAGNESIUM: Magnesium: 2.2 mg/dL (ref 1.7–2.4)

## 2021-10-19 MED ORDER — FERROUS SULFATE 325 (65 FE) MG PO TABS
325.0000 mg | ORAL_TABLET | Freq: Every day | ORAL | Status: DC
  Administered 2021-10-19 – 2021-10-21 (×3): 325 mg via ORAL
  Filled 2021-10-19 (×3): qty 1

## 2021-10-19 MED ORDER — POLYETHYLENE GLYCOL 3350 17 G PO PACK: 17.0000 g | PACK | Freq: Every day | ORAL | Status: DC | PRN

## 2021-10-19 MED ORDER — ACETAMINOPHEN 325 MG PO TABS
650.0000 mg | ORAL_TABLET | Freq: Four times a day (QID) | ORAL | Status: DC | PRN
  Administered 2021-10-19: 650 mg via ORAL
  Filled 2021-10-19: qty 2

## 2021-10-19 MED ORDER — DEXTROSE 50 % IV SOLN
INTRAVENOUS | Status: AC
  Administered 2021-10-19: 50 mL
  Filled 2021-10-19: qty 50

## 2021-10-19 MED ORDER — MEGESTROL ACETATE 40 MG PO TABS
40.0000 mg | ORAL_TABLET | Freq: Two times a day (BID) | ORAL | Status: DC
  Administered 2021-10-19 – 2021-10-21 (×5): 40 mg via ORAL
  Filled 2021-10-19 (×7): qty 1

## 2021-10-19 MED ORDER — GABAPENTIN 300 MG PO CAPS
300.0000 mg | ORAL_CAPSULE | Freq: Every day | ORAL | Status: DC
  Administered 2021-10-19 – 2021-10-20 (×3): 300 mg via ORAL
  Filled 2021-10-19 (×3): qty 1

## 2021-10-19 MED ORDER — SODIUM CHLORIDE 0.9% IV SOLUTION: Freq: Once | INTRAVENOUS | Status: DC

## 2021-10-19 MED ORDER — INSULIN ASPART 100 UNIT/ML IJ SOLN
0.0000 [IU] | Freq: Three times a day (TID) | INTRAMUSCULAR | Status: DC
  Administered 2021-10-19: 8 [IU] via SUBCUTANEOUS

## 2021-10-19 MED ORDER — INSULIN DETEMIR 100 UNIT/ML ~~LOC~~ SOLN
12.0000 [IU] | Freq: Two times a day (BID) | SUBCUTANEOUS | Status: DC
  Administered 2021-10-19 – 2021-10-20 (×3): 12 [IU] via SUBCUTANEOUS
  Filled 2021-10-19 (×6): qty 0.12

## 2021-10-19 MED ORDER — ATORVASTATIN CALCIUM 80 MG PO TABS
80.0000 mg | ORAL_TABLET | Freq: Every evening | ORAL | Status: DC
  Administered 2021-10-19 – 2021-10-20 (×2): 80 mg via ORAL
  Filled 2021-10-19 (×2): qty 1

## 2021-10-19 MED ORDER — INSULIN ASPART 100 UNIT/ML IJ SOLN
0.0000 [IU] | Freq: Three times a day (TID) | INTRAMUSCULAR | Status: DC
  Administered 2021-10-20 (×2): 5 [IU] via SUBCUTANEOUS
  Administered 2021-10-20: 8 [IU] via SUBCUTANEOUS

## 2021-10-19 MED ORDER — MELATONIN 3 MG PO TABS
3.0000 mg | ORAL_TABLET | Freq: Every day | ORAL | Status: DC
Start: 2021-10-19 — End: 2021-10-21
  Administered 2021-10-19 – 2021-10-20 (×2): 3 mg via ORAL
  Filled 2021-10-19 (×2): qty 1

## 2021-10-19 MED ORDER — ASPIRIN EC 81 MG PO TBEC
81.0000 mg | DELAYED_RELEASE_TABLET | Freq: Every day | ORAL | Status: DC
  Administered 2021-10-19 – 2021-10-21 (×3): 81 mg via ORAL
  Filled 2021-10-19 (×3): qty 1

## 2021-10-19 MED ORDER — ACETAMINOPHEN 650 MG RE SUPP: 650.0000 mg | Freq: Four times a day (QID) | RECTAL | Status: DC | PRN

## 2021-10-19 MED ORDER — ORAL CARE MOUTH RINSE
15.0000 mL | Freq: Two times a day (BID) | OROMUCOSAL | Status: DC
  Administered 2021-10-20 – 2021-10-21 (×3): 15 mL via OROMUCOSAL

## 2021-10-19 MED ORDER — FLUOXETINE HCL 20 MG PO CAPS
20.0000 mg | ORAL_CAPSULE | Freq: Every day | ORAL | Status: DC
  Administered 2021-10-19 – 2021-10-21 (×3): 20 mg via ORAL
  Filled 2021-10-19 (×3): qty 1

## 2021-10-19 MED ORDER — CHLORHEXIDINE GLUCONATE 0.12 % MT SOLN
15.0000 mL | Freq: Two times a day (BID) | OROMUCOSAL | Status: DC
  Administered 2021-10-19 – 2021-10-21 (×4): 15 mL via OROMUCOSAL
  Filled 2021-10-19 (×4): qty 15

## 2021-10-19 MED ORDER — POLYETHYLENE GLYCOL 3350 17 G PO PACK: 17.0000 g | PACK | Freq: Every day | ORAL | Status: DC

## 2021-10-19 NOTE — ED Notes (Signed)
Pt ambulatory to restroom

## 2021-10-19 NOTE — ED Notes (Addendum)
MD notified of pt cbg - Pt given OJ and graham crackers at this time

## 2021-10-19 NOTE — Progress Notes (Signed)
Family Medicine Teaching Service Daily Progress Note Intern Pager: [phone]  Patient name: Brenda [name] Medical record number: [id number] Date of birth: 1961/08/29 Age: 61 y.o. Gender: female  Primary Care Provider: Charlott Rakes, MD Consultants: Gyn, cards Code Status: DNI  Pt Overview and Major Events to Date:  2/15-admitted  Assessment and Plan: Patient is a 61 year old female admitted for nonexertional chest pain found to have acute on chronic anemia.  Past medical history significant for CAD s/p remote LAD PCI and anterior STEMI in 2021 s/p PCI of mid LAD, DM2, HFpEF, OSA on CPAP, HTN, HLD, morbid obesity, chronic respiratory failure on 2 L oxygen, anxiety and depression.   Acute blood loss anemia   chronic iron deficiency anemia  Patient has 2-year history of vaginal bleeding which is worsened in the past 4 days.  Patient states blood clots have increased in size from golf ball size to baseball size over the past several days and she has been soaking 3 pads with in 30 to 45 minutes.  Pelvic ultrasound in November showed endometrial thickness of 27.7 mm.  Patient was prescribed iron supplements but has not been taking this due to constipation. Baseline hemoglobin of 9, hemoglobin on admission of 7.3 with INR 1.1.  FOBT in ED was positive.  Patient received 1 unit PRBC in ED. CBC around midnight showed hemoglobin of 6.7 and 7 at 0550. Pt  received 1 unit pRBC this morning with post hgb of 8.5.  -a.m. CBC -Will check hgb this afternoon if significant vaginal bleeding continues -Transfusion threshold < 8 -Consult GYN today -Megace 40 mg daily -ferrous sulfate 325 mg oral daily MiraLAX daily as needed -Outpatient colonoscopy   Exertional chest pain Patient's chest pain was not responsive to nitro at home but did respond to nitro and ASA by EMS.  On admission tropes were low and trended from 6-5, BNP of 73.1 and CXR showed no acute findings and EKG showed no signs of MI.  Patient  does have significant cardiac history but labs and studies are reassuring. Today pt states she is no longer having chest pain.  -Tylenol 650 every 6 hours. -Cardiac monitoring -Outpatient cardiac follow-up   CAD Patient with history s/p remote LAD PCI and anterior STEMI in 2021 s/p PCI of mid LAD.  Home medications include Effient 10 mg daily and bASA however pt states she has not been taking effient for several months. -Holding home Effient -Continue aspirin 81 mg daily   HFpEF Home meds include Entresto 24 to 26 mg, Lasix 40 mg daily and Jardiance 10 mg daily.  Last echo in June 2022 showed normal LV function and EF of 60 to 65%. BLE edema at  baseline today per patient.  Home medications of been held due to low blood pressures, diastolics in the 93O to 67T. -Holding home medications in setting of low BP   T2DM Home meds include Levemir 22 units twice daily, Trulicity 1.5 mg weekly, Jardiance 10 mg daily which is a new regimen as she was previously using over-the-counter insulin 70/30.  Patient also takes gabapentin for neuropathy. CBG of 36 around 3 AM, repeat of 147 and 104 this morning.  -Levemir 12 units twice daily -CBG/SSI -Carb modified diet -Continue home gabapentin  OSA on CPAP   chronic respiratory failure On 2 L at baseline -Supplemental O2 as needed -CPAP nightly  HLD -Continue home atorvastatin  Anxiety and depression -Continue home Prozac   FEN/GI: Carb modified and heart healthy PPx: SCDs Dispo: Pending  clinical improvement  Subjective:  Pt states she is feeling much better s/p 2 blood transfusions. She denies any lightheadedness, palpitations, chest pain, and SOB. States her vaginal bleeding was a little less overnight that it has been recently. States over the past several days, blood clots have increased from "golf ball size" to "baseball size" and she is soaking through pads every 30-45 minutes.   Objective: Temp:  [98.2 F (36.8 C)-98.7 F (37.1  C)] 98.6 F (37 C) (02/16 0340) Pulse Rate:  [75-103] 89 (02/16 0415) Resp:  [11-29] 22 (02/16 0415) BP: (93-146)/(37-71) 146/71 (02/16 0415) SpO2:  [98 %-100 %] 100 % (02/16 0415) FiO2 (%):  [100 %] 100 % (02/16 0203) Weight:  [140.6 kg] 140.6 kg (02/15 2000) Physical Exam: General: Sitting up in bed, NAD Cardiovascular: RRR, no murmurs HEENT: conjunctival pallor Respiratory: CTAB Extremities: 1 + pitting edema of BLEs  Laboratory: Recent Labs  Lab 10/18/21 2000 10/19/21 0040  WBC 13.5* 14.8*  HGB 7.3* 6.7*  HCT 24.4* 22.4*  PLT 337 412*   Recent Labs  Lab 10/18/21 2000 10/19/21 0040  NA 133* 139  K 3.8 3.6  CL 99 103  CO2 23 24  BUN 14 12  CREATININE 1.16* 0.98  CALCIUM 8.2* 8.6*  PROT  --  6.5  BILITOT  --  0.2*  ALKPHOS  --  57  ALT  --  23  AST  --  33  GLUCOSE 381* 107Precious Gilding, DO 10/19/2021, 5:07 AM PGY-1, [place] Intern pager: [phone], text pages welcome

## 2021-10-19 NOTE — Progress Notes (Signed)
PT awake and alert. Placed pt on Cpap, home setting 14 per PT. 3L bleed in. Pt tolerating well. No resp distress noted.

## 2021-10-19 NOTE — ED Notes (Signed)
MD aware of pt diastolic BP per order set

## 2021-10-19 NOTE — Hospital Course (Addendum)
Patient is a 61 year old female initially admitted for nonexertional chest pain found to have acute on chronic anemia likely due to postmenopausal vaginal bleeding.  Past medical history significant for CAD s/p remote LAD PCI and anterior STEMI in 2021 s/p PCI of mid LAD, DM2, HFpEF, OSA on CPAP, HTN, HLD, morbid obesity, chronic respiratory failure on 2 L oxygen, anxiety and depression.  Acute on chronic anemia due to vaginal bleeding Patient has 2-year history of vaginal bleeding which worsened 4 days before coming to the hospital.  Patient stated blood clots had increased in size from golf ball size to baseball size over the past several days and she had been soaking 2 pads with in 30 to 45 minutes.  Pelvic ultrasound in November showed endometrial thickness of 27.7 mm.  Baseline hemoglobin of 9, hemoglobin on admission of 7.3 with INR 1.1.  Transfusion threshold of 8 due to cardiac history.  She received total of 3 blood transfusions during her stay each with 1 unit of pRBCs.  GYN was consulted and patient was started on Megace 40 mg twice daily. At discharge hemoglobin was stable at 8.9 and patient's vaginal bleeding has significantly slowed, is now able to go several hours without a soaked sanitary pad with much smaller blood clots.   Exertional chest pain Patient's chest pain was not responsive to nitro at home but did respond to nitro and ASA by EMS.  On admission troponin was low and trended from 6>5, BNP of 73.1 and CXR showed no acute findings and EKG showed no signs of MI.  Patient does have significant cardiac history (s/p remote LAD PCI and anterior STEMI in 2021 s/p PCI of mid LAD) but labs and studies were reassuring.  Chest pain resolved on her first day admitted and did not return.  Patient should follow-up with outpatient cardiology as soon as possible.  Prolonged QTc  QTc 520 on admission EKG. Recheck EKG outpt.  HFpEF Home medications including Entresto 24-26 mg BID, Lasix 40 mg  daily, and Jardiance 10 mg daily were held due to low blood pressures (diastolics in the 98P to 38S).  Upon discharge blood pressures were stable and patient was discharged with instructions to resume Lasix and Jardiance.  She was instructed to add half a tablet of Entresto twice daily 2 days after discharge if she was feeling well and to hold off on resuming the full dose of 1 tablet twice daily until blood pressures were evaluated.  T2DM Patient was treated with Levemir 15 units twice daily and SSI.  Home gabapentin was continued and patient was placed on carb modified diet. CBG at discharge was 254.   Other conditions chronic and stable: OSA- CPAP nightly HLD - home atorvastatin continued Anxiety and depression - home Prozac continued   Issues for follow up Patient will need colonoscopy outpatient. Ensure patient follows up with gynecology outpatient for biopsy.  Ensure follow-up with cardiology outpatient Recheck EKG (QTc prolongation on admission)  Follow up with primary care doctor for repeat CBC.  Evaluate blood pressure, add back full dose of Entresto if appropriate

## 2021-10-19 NOTE — ED Notes (Signed)
MD notified of repeat Hgb

## 2021-10-19 NOTE — Plan of Care (Signed)

## 2021-10-19 NOTE — Progress Notes (Signed)
°  Spoke with Dr. Roselie Awkward regarding placing a gynecology consult. He recommends starting megace 40 mg bid and will evaluate her today. Likely will need further workup outpatient, including biopsy. Appreciate involvement and recommendations of Dr. Roselie Awkward.   Donney Dice, DO 10/19/2021, 8:41 AM PGY-2, [place] Medicine Service pager [phone]

## 2021-10-19 NOTE — ED Notes (Signed)
Provider at bedside

## 2021-10-19 NOTE — Consult Note (Signed)
Reason for Consult:postmenopausal vaginal bleeding Referring Physician: A. Larae Grooms DO  Brenda [name] is an 61 y.o. female. G3P2A1 Patient is a 61 year old female admitted for nonexertional chest pain found to have acute on chronic anemia.  Past medical history significant for CAD s/p remote LAD PCI and anterior STEMI in 2021 s/p PCI of mid LAD, DM2, HFpEF, OSA on CPAP, HTN, HLD, morbid obesity, chronic respiratory failure on 2 L oxygen, anxiety and depression.     Acute blood loss anemia   chronic iron deficiency anemia  Patient has 2-year history of vaginal bleeding which is worsened in the past 4 days.  Patient states blood clots have increased in size from golf ball size to baseball size over the past several days and she has been soaking 3 pads with in 30 to 45 minutes.  Pelvic ultrasound in November showed endometrial thickness of 27.7 mm.  Patient was prescribed iron supplements but has not been taking this due to constipation. She was scheduled to see Dr. Rip Harbour today at [facility] due to the persistent bleeding. She had not sought care due to insurance issue. She has a long history of PCOS and hirsutism Pertinent Gynecological History: Menses: flow is excessive with use of 5 pads or tampons on heaviest days Bleeding: dysfunctional uterine bleeding Contraception: none DES exposure: denies Blood transfusions: none Sexually transmitted diseases: no past history Previous GYN Procedures:     Menstrual History:  No LMP recorded.    Past Medical History:  Diagnosis Date   Arthritis    "back, hands, hips" (02/22/2015)   CAD (coronary artery disease)    a. complex LAD/diagonal bifurcation PCI in 2010. b. STEMI 10/2019 s/p PTCA/DES x1 to mLAD overlapping the old stent, residual disease treated medicaly.   Depression    Diabetes mellitus ([place])    "started when I was pregnant; not sure if it was type 1 or type 2"    Hypercholesterolemia    Hypertension    Morbid obesity ([place])    Sleep apnea     "suppose to wear mask; I don't" (02/22/2015)    Past Surgical History:  Procedure Laterality Date   CARDIAC CATHETERIZATION  12/2012   CHOLECYSTECTOMY OPEN  1989   CORONARY ANGIOPLASTY WITH STENT PLACEMENT  2011   CORONARY/GRAFT ACUTE MI REVASCULARIZATION N/A 10/26/2019   Procedure: CORONARY/GRAFT ACUTE MI REVASCULARIZATION;  Surgeon: Burnell Blanks, MD;  Location: [place] [facility];  Service: Cardiovascular;  Laterality: N/A;   HERNIA REPAIR     LEFT HEART CATH AND CORONARY ANGIOGRAPHY N/A 10/26/2019   Procedure: LEFT HEART CATH AND CORONARY ANGIOGRAPHY;  Surgeon: Burnell Blanks, MD;  Location: [place] [facility];  Service: Cardiovascular;  Laterality: N/A;   LEFT HEART CATHETERIZATION WITH CORONARY ANGIOGRAM N/A 12/25/2012   Procedure: LEFT HEART CATHETERIZATION WITH CORONARY ANGIOGRAM;  Surgeon: Sherren Mocha, MD;  Location: [facility] CATH LAB;  Service: Cardiovascular;  Laterality: N/A;   UMBILICAL HERNIA REPAIR  55's    Family History  Problem Relation Age of Onset   Heart attack Father        in his mid 65s   COPD Father    Coronary artery disease Mother        in her mid 44s   Hypertension Mother    Cancer Mother    Diabetes Sister    Coronary artery disease Sister        valve replacement   Stroke Sister        multiple    Social  History:  reports that she quit smoking about 35 years ago. Her smoking use included cigarettes and cigars. She has a 0.50 pack-year smoking history. She has never used smokeless tobacco. She reports that she does not drink alcohol and does not use drugs.  Allergies:  Allergies  Allergen Reactions   Hydrocodone Shortness Of Breath    "I forget to breathe."    Clopidogrel Bisulfate     REACTION: rash   Hydrocodeine [Dihydrocodeine]     Medications: I have reviewed the patient's current medications.  Review of Systems  Constitutional:  Positive for fatigue.  Respiratory:  Negative for shortness of breath.    Cardiovascular:  Negative for chest pain.  Genitourinary:  Positive for vaginal bleeding. Negative for pelvic pain.   Blood pressure (!) 113/52, pulse 71, temperature 98.2 F (36.8 C), temperature source Oral, resp. rate 16, height 5\' 1"  (1.549 m), weight (!) 140.6 kg, SpO2 100 %. Physical Exam Vitals and nursing note reviewed.  Constitutional:      Appearance: She is obese. She is not ill-appearing.  HENT:     Head:     Comments: Facial hirsutism  Pulmonary:     Effort: Pulmonary effort is normal.  Skin:    General: Skin is warm and dry.  Neurological:     Mental Status: She is alert.  Psychiatric:        Mood and Affect: Mood normal.        Behavior: Behavior normal.    Results for orders placed or performed during the hospital encounter of 10/18/21 (from the past 48 hour(s))  TSH     Status: None   Collection Time: 10/18/21 12:33 AM  Result Value Ref Range   TSH 2.827 0.350 - 4.500 uIU/mL    Comment: Performed by a 3rd Generation assay with a functional sensitivity of <=0.01 uIU/mL. Performed at [facility], [place] [address], [place], [place] [address]   Resp Panel by RT-PCR (Flu A&B, Covid) Nasopharyngeal Swab     Status: None   Collection Time: 10/18/21  7:52 PM   Specimen: Nasopharyngeal Swab; Nasopharyngeal(NP) swabs in vial transport medium  Result Value Ref Range   SARS Coronavirus 2 by RT PCR NEGATIVE NEGATIVE    Comment: (NOTE) SARS-CoV-2 target nucleic acids are NOT DETECTED.  The SARS-CoV-2 RNA is generally detectable in upper respiratory specimens during the acute phase of infection. The lowest concentration of SARS-CoV-2 viral copies this assay can detect is 138 copies/mL. A negative result does not preclude SARS-Cov-2 infection and should not be used as the sole basis for treatment or other patient management decisions. A negative result may occur with  improper specimen collection/handling, submission of specimen other than nasopharyngeal swab,  presence of viral mutation(s) within the areas targeted by this assay, and inadequate number of viral copies(<138 copies/mL). A negative result must be combined with clinical observations, patient history, and epidemiological information. The expected result is Negative.  Fact Sheet for Patients:  EntrepreneurPulse.com.au  Fact Sheet for Healthcare Providers:  IncredibleEmployment.be  This test is no t yet approved or cleared by the [place] FDA and  has been authorized for detection and/or diagnosis of SARS-CoV-2 by FDA under an Emergency Use Authorization (EUA). This EUA will remain  in effect (meaning this test can be used) for the duration of the COVID-19 declaration under Section 564(b)(1) of the Act, 21 U.S.C.section 360bbb-3(b)(1), unless the authorization is terminated  or revoked sooner.       Influenza A by  PCR NEGATIVE NEGATIVE   Influenza B by PCR NEGATIVE NEGATIVE    Comment: (NOTE) The Xpert Xpress SARS-CoV-2/FLU/RSV plus assay is intended as an aid in the diagnosis of influenza from Nasopharyngeal swab specimens and should not be used as a sole basis for treatment. Nasal washings and aspirates are unacceptable for Xpert Xpress SARS-CoV-2/FLU/RSV testing.  Fact Sheet for Patients: EntrepreneurPulse.com.au  Fact Sheet for Healthcare Providers: IncredibleEmployment.be  This test is not yet approved or cleared by the [place] FDA and has been authorized for detection and/or diagnosis of SARS-CoV-2 by FDA under an Emergency Use Authorization (EUA). This EUA will remain in effect (meaning this test can be used) for the duration of the COVID-19 declaration under Section 564(b)(1) of the Act, 21 U.S.C. section 360bbb-3(b)(1), unless the authorization is terminated or revoked.  Performed at [facility], [place] [address], [place], [place] [address]   Basic metabolic panel      Status: Abnormal   Collection Time: 10/18/21  8:00 PM  Result Value Ref Range   Sodium 133 (L) 135 - 145 mmol/L   Potassium 3.8 3.5 - 5.1 mmol/L   Chloride 99 98 - 111 mmol/L   CO2 23 22 - 32 mmol/L   Glucose, Bld 381 (H) 70 - 99 mg/dL    Comment: Glucose reference range applies only to samples taken after fasting for at least 8 hours.   BUN 14 6 - 20 mg/dL   Creatinine, Ser 1.16 (H) 0.44 - 1.00 mg/dL   Calcium 8.2 (L) 8.9 - 10.3 mg/dL   GFR, Estimated 55 (L) >60 mL/min    Comment: (NOTE) Calculated using the CKD-EPI Creatinine Equation (2021)    Anion gap 11 5 - 15    Comment: Performed at [facility] [address], [place], [place] [address]  CBC     Status: Abnormal   Collection Time: 10/18/21  8:00 PM  Result Value Ref Range   WBC 13.5 (H) 4.0 - 10.5 K/uL   RBC 2.99 (L) 3.87 - 5.11 MIL/uL   Hemoglobin 7.3 (L) 12.0 - 15.0 g/dL   HCT 24.4 (L) 36.0 - 46.0 %   MCV 81.6 80.0 - 100.0 fL   MCH 24.4 (L) 26.0 - 34.0 pg   MCHC 29.9 (L) 30.0 - 36.0 g/dL   RDW 16.0 (H) 11.5 - 15.5 %   Platelets 337 150 - 400 K/uL   nRBC 0.0 0.0 - 0.2 %    Comment: Performed at [facility] [address] [place], [place] [address]  Troponin I (High Sensitivity)     Status: None   Collection Time: 10/18/21  8:00 PM  Result Value Ref Range   Troponin I (High Sensitivity) 6 <18 ng/L    Comment: (NOTE) Elevated high sensitivity troponin I (hsTnI) values and significant  changes across serial measurements may suggest ACS but many other  chronic and acute conditions are known to elevate hsTnI results.  Refer to the Links section for chest pain algorithms and additional  guidance. Performed at [facility], [place] [address], [place], [place] [address]   Brain natriuretic peptide     Status: None   Collection Time: 10/18/21  8:00 PM  Result Value Ref Range   B Natriuretic Peptide 73.1 0.0 - 100.0 pg/mL    Comment: Performed at [facility] [address], [place],   [address]  POC occult blood, ED     Status: Abnormal   Collection  Time: 10/18/21  8:50 PM  Result Value Ref Range   Fecal Occult Bld POSITIVE (A) NEGATIVE  Type and screen [place]     Status: None (Preliminary result)   Collection Time: 10/18/21  9:30 PM  Result Value Ref Range   ABO/RH(D) O POS    Antibody Screen NEG    Sample Expiration 10/21/2021,2359    Unit Number [id number]    Blood Component Type RED CELLS,LR    Unit division 00    Status of Unit ISSUED    Transfusion Status OK TO TRANSFUSE    Crossmatch Result Compatible    Unit Number [id number]    Blood Component Type RED CELLS,LR    Unit division 00    Status of Unit ISSUED    Transfusion Status OK TO TRANSFUSE    Crossmatch Result      Compatible Performed at [facility], [place] [address] [place], [place] [address]   ABO/Rh     Status: None   Collection Time: 10/18/21  9:39 PM  Result Value Ref Range   ABO/RH(D)      O POS Performed at [facility] [address] [place], [place] [address]   Protime-INR     Status: None   Collection Time: 10/18/21  9:52 PM  Result Value Ref Range   Prothrombin Time 14.2 11.4 - 15.2 seconds   INR 1.1 0.8 - 1.2    Comment: (NOTE) INR goal varies based on device and disease states. Performed at [facility], [place] [address], [place], [place] [address]   Troponin I (High Sensitivity)     Status: None   Collection Time: 10/18/21  9:52 PM  Result Value Ref Range   Troponin I (High Sensitivity) 5 <18 ng/L    Comment: (NOTE) Elevated high sensitivity troponin I (hsTnI) values and significant  changes across serial measurements may suggest ACS but many other  chronic and acute conditions are known to elevate hsTnI results.  Refer to the "Links" section for chest pain algorithms and additional  guidance. Performed at [facility], [place] [address], [place], [place] [address]   Prepare RBC (crossmatch)     Status: None    Collection Time: 10/18/21 10:20 PM  Result Value Ref Range   Order Confirmation      ORDER PROCESSED BY BLOOD BANK Performed at [facility], [place] [address] [place], [place] [address]   Comprehensive metabolic panel     Status: Abnormal   Collection Time: 10/19/21 12:40 AM  Result Value Ref Range   Sodium 139 135 - 145 mmol/L   Potassium 3.6 3.5 - 5.1 mmol/L   Chloride 103 98 - 111 mmol/L   CO2 24 22 - 32 mmol/L   Glucose, Bld 107 (H) 70 - 99 mg/dL    Comment: Glucose reference range applies only to samples taken after fasting for at least 8 hours.   BUN 12 6 - 20 mg/dL   Creatinine, Ser 0.98 0.44 - 1.00 mg/dL   Calcium 8.6 (L) 8.9 - 10.3 mg/dL   Total Protein 6.5 6.5 - 8.1 g/dL   Albumin 3.2 (L) 3.5 - 5.0 g/dL   AST 33 15 - 41 U/L   ALT 23 0 - 44 U/L   Alkaline Phosphatase 57 38 - 126 U/L   Total Bilirubin 0.2 (L) 0.3 - 1.2 mg/dL   GFR, Estimated >60 >60 mL/min    Comment: (NOTE) Calculated using the  CKD-EPI Creatinine Equation (2021)    Anion gap 12 5 - 15    Comment: Performed at [facility], [place] [address] [place], [place] [address]  CBC     Status: Abnormal   Collection Time: 10/19/21 12:40 AM  Result Value Ref Range   WBC 14.8 (H) 4.0 - 10.5 K/uL   RBC 2.74 (L) 3.87 - 5.11 MIL/uL   Hemoglobin 6.7 (LL) 12.0 - 15.0 g/dL    Comment: REPEATED TO VERIFY THIS CRITICAL RESULT HAS VERIFIED AND BEEN CALLED TO E SULLIVAN RN BY CANDACE HAYES ON 02 16 2023 AT 0115, AND HAS BEEN READ BACK.     HCT 22.4 (L) 36.0 - 46.0 %   MCV 81.8 80.0 - 100.0 fL   MCH 24.5 (L) 26.0 - 34.0 pg   MCHC 29.9 (L) 30.0 - 36.0 g/dL   RDW 16.2 (H) 11.5 - 15.5 %   Platelets 412 (H) 150 - 400 K/uL   nRBC 0.2 0.0 - 0.2 %    Comment: Performed at [facility] [address] [place], [place] [address]  Magnesium     Status: None   Collection Time: 10/19/21  3:01 AM  Result Value Ref Range   Magnesium 2.2 1.7 - 2.4 mg/dL    Comment: Performed at [facility]  [address] [place], [place] [address]  CBG monitoring, ED     Status: Abnormal   Collection Time: 10/19/21  3:11 AM  Result Value Ref Range   Glucose-Capillary 36 (LL) 70 - 99 mg/dL    Comment: Glucose reference range applies only to samples taken after fasting for at least 8 hours.   Comment 1 Document in Chart   CBG monitoring, ED     Status: Abnormal   Collection Time: 10/19/21  3:48 AM  Result Value Ref Range   Glucose-Capillary 147 (H) 70 - 99 mg/dL    Comment: Glucose reference range applies only to samples taken after fasting for at least 8 hours.  Hemoglobin and hematocrit, blood     Status: Abnormal   Collection Time: 10/19/21  5:50 AM  Result Value Ref Range   Hemoglobin 7.0 (L) 12.0 - 15.0 g/dL    Comment: REPEATED TO VERIFY   HCT 23.6 (L) 36.0 - 46.0 %    Comment: Performed at [facility] [address], [place], [place] [address]  Prepare RBC (crossmatch)     Status: None   Collection Time: 10/19/21  6:05 AM  Result Value Ref Range   Order Confirmation      ORDER PROCESSED BY BLOOD BANK Performed at [facility], [place] [address], [place],  [address]   CBG monitoring, ED     Status: Abnormal   Collection Time: 10/19/21  7:51 AM  Result Value Ref Range   Glucose-Capillary 104 (H) 70 - 99 mg/dL    Comment: Glucose reference range applies only to samples taken after fasting for at least 8 hours.    DG Chest Port 1 View  Result Date: 10/18/2021 CLINICAL DATA:  Chest pain EXAM: PORTABLE CHEST 1 VIEW COMPARISON:  10/03/2021 FINDINGS: Single frontal view of the chest demonstrates chronic elevation of the right hemidiaphragm. Cardiac silhouette is stable. No airspace disease, effusion, or pneumothorax. No acute bony abnormality. IMPRESSION: 1. Stable chest, no acute process. Electronically Signed   By: Randa Ngo M.D.   On: 10/18/2021 20:32   Narrative & Impression  CLINICAL DATA:  Abnormal uterine  bleeding   EXAM: TRANSABDOMINAL AND TRANSVAGINAL ULTRASOUND  OF PELVIS   TECHNIQUE: Both transabdominal and transvaginal ultrasound examinations of the pelvis were performed. Transabdominal technique was performed for global imaging of the pelvis including uterus, ovaries, adnexal regions, and pelvic cul-de-sac. It was necessary to proceed with endovaginal exam following the transabdominal exam to visualize the uterus endometrium ovaries.   COMPARISON:  None   FINDINGS: Uterus   Measurements: 10.7 x 6.7 x 8 cm = volume: 300.9 mL. No fibroids or other mass visualized.   Endometrium   Thickness: 27.7 mm.  No focal abnormality visualized.   Right ovary   Not seen   Left ovary   Not seen   Other findings   No abnormal free fluid.   IMPRESSION: 1. Endometrial thickness of 27.7 mm. In the setting of post-menopausal bleeding, endometrial sampling is indicated to exclude carcinoma. If results are benign, sonohysterogram should be considered for focal lesion work-up. (Ref: Radiological Reasoning: Algorithmic Workup of Abnormal Vaginal Bleeding with Endovaginal Sonography and Sonohysterography. AJR 2008; 628:B15-17) 2. Nonvisualized ovaries     Electronically Signed   By: Donavan Foil M.D.   On: 07/13/2021 15:14     Assessment/Plan: Postmenopausal vaginal bleeding with thickened endometrium. Will start Megace 40 mg BID and she is being admitted to [facility] for transfusion and management. I requested she be rescheduled for Gyn f/u at [facility] after discharge and she voiced understanding  Emeterio Reeve 10/19/2021

## 2021-10-19 NOTE — ED Notes (Signed)
Per MD [place] give pt full amp of D50 at this time

## 2021-10-19 NOTE — Progress Notes (Signed)
FPTS Interim Progress Note  Went to bedside to check on patient. Patient's husband also at bedside. Patient reports that the bleeding briefly improved overnight and this morning but then noticed again while on the toilet earlier this afternoon. Husband also confirms this. I explained that we have started megace and will recheck hemoglobin later this afternoon and again tomorrow morning to ensure her level is stable. Vitals noted to be stable as observed on telemetry.   Spoke to Dr. Roselie Awkward who is hesitant to use IV premarin given patient's history of CAD s/p stenting. He believes that megace should be sufficient to get patient stable to have remainder of the workup, including an endometrial biopsy, performed outpatient. Continue megace 40 mg bid. Will continue to monitor bleeding and Hgb, awaiting repeat CBC this afternoon.   Donney Dice, DO 10/19/2021, 3:40 PM PGY-2, [facility] Service pager [phone]

## 2021-10-19 NOTE — ED Notes (Addendum)
ED Provider at bedside. 

## 2021-10-19 NOTE — Progress Notes (Signed)
FPTS Brief Progress Note  S:Ms Donnellan reports feeling generally well this evening. She feels as though her vaginal bleeding has slowed down and she has not had any further episodes of chest pain. She would very much like to discharge home tomorrow if possible, as her mother-in-law's funeral is on Saturday. She reports poor sleep last night.   O: BP (!) 118/49 (BP Location: Right Arm)    Pulse 86    Temp 98.5 F (36.9 C) (Oral)    Resp (!) 21    Ht 5\' 1"  (1.549 m)    Wt (!) 140.6 kg    SpO2 100%    BMI 58.57 kg/m   Gen: Awake, lying in bed, NAD Resp: Breathing comfortably on 2LNC ; CPAP at bedside  A/P: Megace 40mg  BID per gyn, will need outpt endometrial biopsy. Vitals have been stable.  Will check am CBC to monitor hgb, if stable could potentially discharge tomorrow. Will transfuse for hgb <8.0.  - Orders reviewed. Labs for AM ordered, which was adjusted as needed.   Eppie Gibson, MD 10/19/2021, 11:25 PM PGY-1, [facility] Night Resident  Please page [phone] with questions.

## 2021-10-19 NOTE — Evaluation (Signed)
Occupational Therapy Evaluation Patient Details Name: Brenda [name] MRN: [id number] DOB: Jun 29, 1961 Today's Date: 10/19/2021   History of Present Illness 61 y.o. female presenting with non-exertional chest pain also found to have acute on chronic anemia.  HGB at the time of Ot eval was 8.6.   PMH is significant for CAD s/p remote LAD PCI and anterior STEMI in 2021 s/p PCI of mid-LAD, DM2, HFpEF, OSA on CPAP, HTN, HLD, Class III Obesity, chronic respiratory failure on 2L O2, anxiety and depression.  Patient has 2-year history of vaginal bleeding which is worsened in the past 4 days.   Clinical Impression   Patient admitted for the above diagnosis.  PTA she lives at home with her children.  Her and her spouse live in separate homes, but he is available to assist as needed.  Patient was returning from the bathroom, no assist needed.  Currently she is hesitant to mobilize much more than this due to heavy vaginal bleeding.  Patient admits to  weakness/decreased activity tolerance due to chronic anemia.  She has received 2 units of PRBC's, and subjectively feels as if she has more energy.  PT eval is pending, but no real OT needs exist in the acute setting.  She is not receptive to post acute HH, and no post acute OT is anticipated.  Currently she is very close to her baseline.  Encouraged her to continue to mobilize in her room safely.        Recommendations for follow up therapy are one component of a multi-disciplinary discharge planning process, led by the attending physician.  Recommendations may be updated based on patient status, additional functional criteria and insurance authorization.   Follow Up Recommendations  No OT follow up    Assistance Recommended at Discharge PRN  Patient can return home with the following      Functional Status Assessment  Patient has not had a recent decline in their functional status  Equipment Recommendations  None recommended by OT    Recommendations  for Other Services       Precautions / Restrictions Precautions Precautions: None Restrictions Weight Bearing Restrictions: No Other Position/Activity Restrictions: Body Habitus  Home Oxygen use     Mobility Bed Mobility               General bed mobility comments: Up in the recliner Patient Response: Cooperative  Transfers Overall transfer level: Modified independent                 General transfer comment: Patient has been completing transfers to the toilet on her own.  No RW needed.      Balance Overall balance assessment: Mild deficits observed, not formally tested                                         ADL either performed or assessed with clinical judgement   ADL                                         General ADL Comments: close to baseline, setup of supplies.     Vision Baseline Vision/History: 1 Wears glasses Patient Visual Report: No change from baseline       Perception Perception Perception: Not tested   Praxis Praxis Praxis: Not tested  Pertinent Vitals/Pain Pain Assessment Pain Assessment: No/denies pain     Hand Dominance Right   Extremity/Trunk Assessment Upper Extremity Assessment Upper Extremity Assessment: Overall WFL for tasks assessed   Lower Extremity Assessment Lower Extremity Assessment: Defer to PT evaluation   Cervical / Trunk Assessment Cervical / Trunk Assessment: Other exceptions Cervical / Trunk Exceptions: body habitus   Communication Communication Communication: No difficulties   Cognition Arousal/Alertness: Awake/alert Behavior During Therapy: WFL for tasks assessed/performed Overall Cognitive Status: Within Functional Limits for tasks assessed                                       General Comments   VSS    Exercises     Shoulder Instructions      Home Living Family/patient expects to be discharged to:: Private residence Living  Arrangements: Children Available Help at Discharge: Available 24 hours/day;Family Type of Home: House Home Access: Ramped entrance     Home Layout: One level     Bathroom Shower/Tub: Teacher, early years/pre: Standard Bathroom Accessibility: Yes How Accessible: Accessible via walker Home Equipment: Rollator (4 wheels);Shower seat;Grab bars - tub/shower;Grab bars - toilet          Prior Functioning/Environment Prior Level of Function : Independent/Modified Independent             Mobility Comments: Patient uses 4WRW in the community, not in the home. ADLs Comments: Patient has net needed any assist for ADL/IADL.  Patient uses good compensatory strategies to complete tasks given chronic anemia and weakness        OT Problem List: Decreased activity tolerance;Obesity;Other (comment) (Heavy vaginal bleeding)      OT Treatment/Interventions:      OT Goals(Current goals can be found in the care plan section) Acute Rehab OT Goals Patient Stated Goal: Be able to return home OT Goal Formulation: With patient Time For Goal Achievement: 10/26/21 Potential to Achieve Goals: Good  OT Frequency:      Co-evaluation              AM-PAC OT "6 Clicks" Daily Activity     Outcome Measure Help from another person eating meals?: None Help from another person taking care of personal grooming?: None Help from another person toileting, which includes using toliet, bedpan, or urinal?: None Help from another person bathing (including washing, rinsing, drying)?: A Little Help from another person to put on and taking off regular upper body clothing?: None Help from another person to put on and taking off regular lower body clothing?: A Little 6 Click Score: 22   End of Session    Activity Tolerance: Patient tolerated treatment well Patient left: in chair;with call bell/phone within reach;with nursing/sitter in room  OT Visit Diagnosis: Muscle weakness (generalized)  (M62.81)                Time: 6378-5885 OT Time Calculation (min): 18 min Charges:  OT General Charges $OT Visit: 1 Visit OT Evaluation $OT Eval Moderate Complexity: 1 Mod  10/19/2021  RP, OTR/L  Acute Rehabilitation Services  Office:  [phone]   Metta Clines 10/19/2021, 5:04 PM

## 2021-10-20 LAB — BASIC METABOLIC PANEL
Anion gap: 7 (ref 5–15)
BUN: 9 mg/dL (ref 6–20)
CO2: 24 mmol/L (ref 22–32)
Calcium: 8.1 mg/dL — ABNORMAL LOW (ref 8.9–10.3)
Chloride: 104 mmol/L (ref 98–111)
Creatinine, Ser: 0.93 mg/dL (ref 0.44–1.00)
GFR, Estimated: >60 mL/min (ref >60)
Glucose, Bld: 226 mg/dL — ABNORMAL HIGH (ref 70–99)
Potassium: 4.3 mmol/L (ref 3.5–5.1)
Sodium: 135 mmol/L (ref 135–145)

## 2021-10-20 LAB — GLUCOSE, CAPILLARY
Glucose-Capillary: 229 mg/dL — ABNORMAL HIGH (ref 70–99)
Glucose-Capillary: 231 mg/dL — ABNORMAL HIGH (ref 70–99)
Glucose-Capillary: 294 mg/dL — ABNORMAL HIGH (ref 70–99)
Glucose-Capillary: 291 mg/dL — ABNORMAL HIGH (ref 70–99)

## 2021-10-20 LAB — PREPARE RBC (CROSSMATCH)

## 2021-10-20 LAB — CBC
HCT: 26.2 % — ABNORMAL LOW (ref 36.0–46.0)
HCT: 25.8 % — ABNORMAL LOW (ref 36.0–46.0)
Hemoglobin: 8.1 g/dL — ABNORMAL LOW (ref 12.0–15.0)
Hemoglobin: 7.9 g/dL — ABNORMAL LOW (ref 12.0–15.0)
MCH: 25.4 pg — ABNORMAL LOW (ref 26.0–34.0)
MCH: 25.6 pg — ABNORMAL LOW (ref 26.0–34.0)
MCHC: 30.9 g/dL (ref 30.0–36.0)
MCHC: 30.6 g/dL (ref 30.0–36.0)
MCV: 82.1 fL (ref 80.0–100.0)
MCV: 83.5 fL (ref 80.0–100.0)
Platelets: 267 10*3/uL (ref 150–400)
Platelets: 278 10*3/uL (ref 150–400)
RBC: 3.19 MIL/uL — ABNORMAL LOW (ref 3.87–5.11)
RBC: 3.09 MIL/uL — ABNORMAL LOW (ref 3.87–5.11)
RDW: 17.1 % — ABNORMAL HIGH (ref 11.5–15.5)
RDW: 17.2 % — ABNORMAL HIGH (ref 11.5–15.5)
WBC: 10.7 10*3/uL — ABNORMAL HIGH (ref 4.0–10.5)
WBC: 11.4 10*3/uL — ABNORMAL HIGH (ref 4.0–10.5)
nRBC: 0.3 % — ABNORMAL HIGH (ref 0.0–0.2)
nRBC: 0.3 % — ABNORMAL HIGH (ref 0.0–0.2)

## 2021-10-20 MED ORDER — SODIUM CHLORIDE 0.9% IV SOLUTION: Freq: Once | INTRAVENOUS | Status: DC

## 2021-10-20 MED ORDER — INSULIN DETEMIR 100 UNIT/ML ~~LOC~~ SOLN
15.0000 [IU] | Freq: Two times a day (BID) | SUBCUTANEOUS | Status: DC
  Administered 2021-10-20 – 2021-10-21 (×2): 15 [IU] via SUBCUTANEOUS
  Filled 2021-10-20 (×3): qty 0.15

## 2021-10-20 MED ORDER — INSULIN ASPART 100 UNIT/ML IJ SOLN
0.0000 [IU] | Freq: Three times a day (TID) | INTRAMUSCULAR | Status: DC
  Administered 2021-10-21: 5 [IU] via SUBCUTANEOUS
  Administered 2021-10-21: 8 [IU] via SUBCUTANEOUS

## 2021-10-20 MED ORDER — INSULIN ASPART 100 UNIT/ML IJ SOLN
0.0000 [IU] | Freq: Every day | INTRAMUSCULAR | Status: DC
  Administered 2021-10-20: 3 [IU] via SUBCUTANEOUS

## 2021-10-20 NOTE — Progress Notes (Signed)
FPTS Brief Progress Note  S: Patient denies any acute complaints.  She reports that her vaginal bleeding has slowed down greatly after starting Megace.  She says that she has not had to change her pad in about 4 hours.  She is also asking that we at nighttime NovoLog coverage to her insulin regimen.   O: BP (!) 131/41 (BP Location: Left Arm)    Pulse 62    Temp 98.2 F (36.8 C) (Oral)    Resp 20    Ht 5\' 1"  (1.549 m)    Wt 133.4 kg Comment: scale A   SpO2 100%    BMI 55.59 kg/m   General: Resting comfortably, CPAP in place Pulm: Normal work of breathing with CPAP, 2L supplemental O2  A/P: Just finished transfusion of 1u PRBC. Will follow-up post-transfusion H&H.  Also added HS insulin coverage.  Remainder of plan per day team's note.  A.m. CBC timed for 0800 to allow adequate spacing between posttransfusion H&H and next lab draw.  Eppie Gibson, MD 10/20/2021, 10:18 PM PGY-1, [facility] Night Resident  Please page [phone] with questions.

## 2021-10-20 NOTE — Discharge Instructions (Addendum)
You were hospitalized at [facility] due to chest pain and vaginal bleeding. We are so glad you are feeling better.  Be sure to follow-up with your regularly scheduled appointments.  Please also be sure to follow-up with your primary care doctor at your earliest convenience for a repeat blood count to make sure your hemoglobin levels are not too low.  Please take megace 40 mg twice daily and make sure to follow up with the gynecologist.  While you were in the hospital we held your heart failure medications due to low blood pressures including Entresto, Lasix, and Jardiance.  Please continue to take your Lasix and Jardiance after discharge but hold off on taking your Entresto for 2 days.  If in 2 days you are feeling fine you may add half a tablet of your Entresto twice a day.  Wait until your blood pressure is evaluated before taking your full dose of Entresto twice a day. Thank you for allowing [place] to be a part of your medical care.  Take care, [facility] family medicine team

## 2021-10-20 NOTE — Progress Notes (Signed)
PT Cancellation Note  Patient Details Name: Brenda [name] MRN: [id number] DOB: 1961-07-12   Cancelled Treatment:    Reason Eval/Treat Not Completed: Other (comment) (pt declined stating excessive vaginal bleeding worse with standing or moving and politely defers to next date in hopes of improvement)   Ellsie Violette B Brinda Focht 10/20/2021, 10:47 AM Bayard Males, PT Acute Rehabilitation Services Pager: [phone] Office: [phone]

## 2021-10-20 NOTE — Progress Notes (Signed)
Family Medicine Teaching Service Daily Progress Note Intern Pager: [phone]  Patient name: Brenda [name] Medical record number: [id number] Date of birth: 07-Sep-1960 Age: 61 y.o. Gender: female  Primary Care Provider: Charlott Rakes, MD Consultants: GYN Code Status: DNI  Pt Overview and Major Events to Date:  2/15-admitted 2/15 and 2/16-blood transfusions with 1 unit pRBCs  Assessment and Plan: Patient is a 61 year old female initial admitted for nonexertional chest pain found to have acute on chronic anemia likely due to postmenopausal vaginal bleeding.  Past medical history significant for CAD s/p remote LAD PCI and anterior STEMI in 2021 s/p PCI of mid LAD, DM2, HFpEF, OSA on CPAP, HTN, HLD, morbid obesity, chronic respiratory failure on 2 L oxygen, anxiety and depression.  Acute blood loss anemia   chronic iron deficiency anemia Yesterday patient was started on Megace 40 mg daily per GYN.  She was also started on ferrous sulfate 3 2 5  mg oral daily.  GYN recommends patient be scheduled for GYN follow-up at [facility] after she is stable for discharge.  Her last and second blood transfusion was done yesterday morning with a repeat hemoglobin of 8.6.  This morning the hemoglobin is stable at 8.1.  Patient states she is still having to change affect pads every 45 minutes and having golf ball size clots. -Transfusion threshold less than 8 -GYN consulted -Megace 40 mg daily -Ferrous sulfate 325 mg oral daily -MiraLAX daily as needed for constipation -Afternoon CBC -Outpatient colonoscopy  Exertional chest pain   CAD Patient's exertional chest pain has improved and was likely due to acute blood loss anemia patient had work-up and no evidence of MI.  Cardiac history includes remote LAD PCI and anterior STEMI in 2021 s/p PCI of mid LAD.  Home medications include Effient 10 mg daily and baby aspirin however patient states she has not been taking the Effient for several months. -Holding home  Effient due to bleeding -Continue aspirin 81 mg daily  HFpEF Home meds include Entresto 24 to 26 mg, Lasix 40 mg daily and Jardiance 10 mg daily.  Last echo in June thousand 22 showed normal LV function and for 60 to 65%.  Home medications have been held due to low blood pressures. Today pt has bp of 146/63. -We will continue to monitor blood pressures, consider starting home meds back soon.  T2DM Home meds include Levemir 22 units twice daily, Trulicity 1.5 mg weekly, Jardiance 10 mg daily which is a new regimen that she was previously using over-the-counter insulin 70/30.  Patient also takes gabapentin for neuropathy. Yesterday she got a total of 24 units levemir and 13 units aspart.  CBGs this morning of 229 -Increase Levemir to 15 units twice daily -CBG/SSI -Modified diet -Continue home gabapentin  OSA on CPAP   chronic respiratory failure On 2 L at baseline -Supplemental O2 as needed -CPAP nightly  HLD -Continue home atorvastatin  Anxiety and depression -Continue home Prozac     FEN/GI: Carb modified and heart healthy PPx: SCDs Dispo: Home pending clinical improvement  Subjective:  Patient states she is feeling much better today after having blood transfusions.  She would like to go home to attend a funeral that is tomorrow however understands our medical decision to have her stay for continued monitoring as her bleeding has not slowed.  Objective: Temp:  [98.1 F (36.7 C)-98.5 F (36.9 C)] 98.5 F (36.9 C) (02/16 1929) Pulse Rate:  [67-99] 86 (02/16 2317) Resp:  [11-25] 20 (02/16 2329) BP: (98-140)/(33-81) 101/50 (  02/16 2329) SpO2:  [94 %-100 %] 100 % (02/16 2332) Weight:  [133.4 kg-134.8 kg] 133.4 kg (02/17 0300) Physical Exam: General: Sitting up in bed, NAD Cardiovascular: RRR, normal S1/S2 Respiratory: CTAB Extremities: +1 pitting edema of BLEs  Laboratory: Recent Labs  Lab 10/19/21 0040 10/19/21 0550 10/19/21 1005 10/19/21 1600 10/20/21 0400  WBC  14.8*  --   --  14.5* 10.7*  HGB 6.7*   < > 8.5* 8.6* 8.1*  HCT 22.4*   < > 27.2* 27.0* 26.2*  PLT 412*  --   --  308 267   < > = values in this interval not displayed.   Recent Labs  Lab 10/18/21 2000 10/19/21 0040 10/20/21 0400  NA 133* 139 135  K 3.8 3.6 4.3  CL 99 103 104  CO2 23 24 24   BUN 14 12 9   CREATININE 1.16* 0.98 0.93  CALCIUM 8.2* 8.6* 8.1*  PROT  --  6.5  --   BILITOT  --  0.2*  --   ALKPHOS  --  57  --   ALT  --  23  --   AST  --  33  --   GLUCOSE 381* 107* 226Precious Gilding, DO 10/20/2021, 5:00 AM PGY-1, [place] Intern pager: [phone], text pages welcome

## 2021-10-20 NOTE — Progress Notes (Signed)
Patient placed self on CPAP. Resting comfortably.

## 2021-10-20 NOTE — TOC Progression Note (Signed)
Transition of Care [facility]) - Progression Note    Patient Details  Name: Brenda [name] MRN: [id number] Date of Birth: September 16, 1960  Transition of Care [facility]) CM/SW Contact  Zenon Mayo, RN Phone Number: 10/20/2021, 4:58 PM  Clinical Narrative:      from home, acute blood loss anemia, chest pain still bleeding, transfused 2 units yesterday.   TOC will continue to follow for dc needs.       Expected Discharge Plan and Services                                                 Social Determinants of Health (SDOH) Interventions    Readmission Risk Interventions Readmission Risk Prevention Plan 10/28/2019  Post Dischage Appt Complete  Medication Screening Complete  Transportation Screening Complete  Some recent data might be hidden

## 2021-10-20 NOTE — Progress Notes (Signed)
Patient placed on CPAP at this time.  

## 2021-10-21 LAB — CBC
HCT: 28 % — ABNORMAL LOW (ref 36.0–46.0)
Hemoglobin: 8.9 g/dL — ABNORMAL LOW (ref 12.0–15.0)
MCH: 26.6 pg (ref 26.0–34.0)
MCHC: 31.8 g/dL (ref 30.0–36.0)
MCV: 83.6 fL (ref 80.0–100.0)
Platelets: 251 10*3/uL (ref 150–400)
RBC: 3.35 MIL/uL — ABNORMAL LOW (ref 3.87–5.11)
RDW: 17.3 % — ABNORMAL HIGH (ref 11.5–15.5)
WBC: 11 10*3/uL — ABNORMAL HIGH (ref 4.0–10.5)
nRBC: 0.2 % (ref 0.0–0.2)

## 2021-10-21 LAB — BPAM RBC
Blood Product Expiration Date: 202303132359
Blood Product Expiration Date: 202303172359
Blood Product Expiration Date: 202303212359
ISSUE DATE / TIME: 202302160043
ISSUE DATE / TIME: 202302160627
ISSUE DATE / TIME: 202302171858
Unit Type and Rh: 5100
Unit Type and Rh: 5100
Unit Type and Rh: 5100

## 2021-10-21 LAB — TYPE AND SCREEN
ABO/RH(D): O POS
Antibody Screen: NEGATIVE
Unit division: 0
Unit division: 0
Unit division: 0

## 2021-10-21 LAB — HEMOGLOBIN AND HEMATOCRIT, BLOOD
HCT: 27.8 % — ABNORMAL LOW (ref 36.0–46.0)
Hemoglobin: 8.4 g/dL — ABNORMAL LOW (ref 12.0–15.0)

## 2021-10-21 LAB — GLUCOSE, CAPILLARY
Glucose-Capillary: 210 mg/dL — ABNORMAL HIGH (ref 70–99)
Glucose-Capillary: 254 mg/dL — ABNORMAL HIGH (ref 70–99)

## 2021-10-21 MED ORDER — MEGESTROL ACETATE 40 MG PO TABS: 40.0000 mg | ORAL_TABLET | Freq: Two times a day (BID) | ORAL | 0 refills | Status: DC

## 2021-10-21 MED ORDER — MEGESTROL ACETATE 40 MG PO TABS
40.0000 mg | ORAL_TABLET | Freq: Two times a day (BID) | ORAL | 0 refills | Status: DC
  Filled 2021-10-21: qty 42, 21d supply, fill #0

## 2021-10-21 NOTE — Evaluation (Signed)
Physical Therapy Evaluation Patient Details Name: Brenda [name] MRN: [id number] DOB: 08/05/1961 Today's Date: 10/21/2021  History of Present Illness  The pt is a 61 yo female presenting 2/15 with intermittent chest pain that did not respond to NG. Upon work up, pt found to have hypotension, vaginal bleeding, and anemia. She is now s/p 2 untis PRBC. PMH includes: CAD, STEMI, HTN, DM II, morbid obesity.   Clinical Impression  Pt in bed upon arrival of PT, agreeable to evaluation at this time. Prior to admission the pt was mobilizing with use of rollator for community distances, independent with mobility and 2L O2 for mobility in the home. The pt reports she was independent with ADLs as well, but does have assist available as needed from family at home. The pt was able to complete short distance mobility in the room on 2L O2 without need for UE support or assist. 3/4 DOE but SpO2 96% on baseline 2L O2. The pt was encouraged to pursue consistent exercise, and was able to name a few activities (repeated sit-stand, walking, and chair yoga) that she plans to pursue when d/c. She has all needed support and DME, no further acute PT needs. Will sign off at this time, thank you for the consult.          Recommendations for follow up therapy are one component of a multi-disciplinary discharge planning process, led by the attending physician.  Recommendations may be updated based on patient status, additional functional criteria and insurance authorization.  Follow Up Recommendations No PT follow up (pt encouraged to pursue exercise classes and increase walking)    Assistance Recommended at Discharge Intermittent Supervision/Assistance  Patient can return home with the following  A little help with bathing/dressing/bathroom;Assistance with cooking/housework;Help with stairs or ramp for entrance;Assist for transportation    Equipment Recommendations None recommended by PT  Recommendations for Other  Services       Functional Status Assessment Patient has had a recent decline in their functional status and demonstrates the ability to make significant improvements in function in a reasonable and predictable amount of time.     Precautions / Restrictions Precautions Precautions: None Restrictions Weight Bearing Restrictions: No      Mobility  Bed Mobility Overal bed mobility: Independent             General bed mobility comments: sitting EOB upon my arrival    Transfers Overall transfer level: Modified independent Equipment used: None               General transfer comment: sit-stand without assist or DME, good power up to standing    Ambulation/Gait Ambulation/Gait assistance: Supervision Gait Distance (Feet): 25 Feet (+ 25 ft) Assistive device: None Gait Pattern/deviations: WFL(Within Functional Limits) Gait velocity: decreased Gait velocity interpretation: <1.8 ft/sec, indicate of risk for recurrent falls   General Gait Details: mild instability and pt 3/4 DOE with 2 x 25 ft ambulation in the room. VSS on 2L O2 (pt's baseline)     Balance Overall balance assessment: Mild deficits observed, not formally tested                                           Pertinent Vitals/Pain Pain Assessment Pain Assessment: No/denies pain    Home Living Family/patient expects to be discharged to:: Private residence Living Arrangements: Children;Spouse/significant other Available Help at Discharge: Available 24 hours/day;Family  Type of Home: House Home Access: Ramped entrance       Home Layout: One level Home Equipment: Rollator (4 wheels);Shower seat;Grab bars - tub/shower;Grab bars - toilet      Prior Function Prior Level of Function : Independent/Modified Independent             Mobility Comments: Patient uses 4WRW in the community, not in the home. ADLs Comments: Patient has net needed any assist for ADL/IADL.  Patient uses good  compensatory strategies to complete tasks given chronic anemia and weakness     Hand Dominance   Dominant Hand: Right    Extremity/Trunk Assessment   Upper Extremity Assessment Upper Extremity Assessment: Overall WFL for tasks assessed    Lower Extremity Assessment Lower Extremity Assessment: Overall WFL for tasks assessed    Cervical / Trunk Assessment Cervical / Trunk Assessment: Other exceptions Cervical / Trunk Exceptions: body habitus  Communication   Communication: No difficulties  Cognition Arousal/Alertness: Awake/alert Behavior During Therapy: WFL for tasks assessed/performed Overall Cognitive Status: Within Functional Limits for tasks assessed                                 General Comments: pt with good safety insight, good recall of advice from therapist from last admission        General Comments General comments (skin integrity, edema, etc.): VSS on 2L O2    Exercises     Assessment/Plan    PT Assessment Patient does not need any further PT services         PT Goals (Current goals can be found in the Care Plan section)  Acute Rehab PT Goals Patient Stated Goal: return home PT Goal Formulation: With patient Time For Goal Achievement: 11/04/21 Potential to Achieve Goals: Good     AM-PAC PT "6 Clicks" Mobility  Outcome Measure Help needed turning from your back to your side while in a flat bed without using bedrails?: None Help needed moving from lying on your back to sitting on the side of a flat bed without using bedrails?: None Help needed moving to and from a bed to a chair (including a wheelchair)?: None Help needed standing up from a chair using your arms (e.g., wheelchair or bedside chair)?: None Help needed to walk in hospital room?: None Help needed climbing 3-5 steps with a railing? : A Little 6 Click Score: 23    End of Session Equipment Utilized During Treatment: Oxygen Activity Tolerance: Patient tolerated treatment  well Patient left: in bed;with nursing/sitter in room Nurse Communication: Mobility status PT Visit Diagnosis: Unsteadiness on feet (R26.81);Muscle weakness (generalized) (M62.81)    Time: 4540-9811 PT Time Calculation (min) (ACUTE ONLY): 9 min   Charges:   PT Evaluation $PT Eval Low Complexity: 1 Low          West Carbo, PT, DPT   Acute Rehabilitation Department Pager #: [phone]  Sandra Cockayne 10/21/2021, 1:42 PM

## 2021-10-21 NOTE — Plan of Care (Signed)

## 2021-10-21 NOTE — Discharge Summary (Signed)
[facility] Discharge Summary  Patient name: Brenda [name] Medical record number: [id number] Date of birth: Jul 28, 1961 Age: 61 y.o. Gender: female Date of Admission: 10/18/2021  Date of Discharge: 10/21/21 Admitting Physician: Lyndee Hensen, DO  Primary Care Provider: Charlott Rakes, MD Consultants: OBGYN  Indication for Hospitalization: Acute on chronic blood loss anemia  Discharge Diagnoses/Problem List:  Active Problems:   Nonspecific chest pain   Dysfunctional uterine bleeding   Symptomatic anemia    Disposition: Home  Discharge Condition: Stable  Discharge Exam:  General: 61 year old morbidly obese female sitting up in bed, pleasant to speak with, NAD Cardio: RRR, normal S2/S1 Lungs: CTAB normal work of breathing on room air Extremities: 1+ pitting edema of BLEs  Brief Hospital Course:  Patient is a 61 year old female initially admitted for nonexertional chest pain found to have acute on chronic anemia likely due to postmenopausal vaginal bleeding.  Past medical history significant for CAD s/p remote LAD PCI and anterior STEMI in 2021 s/p PCI of mid LAD, DM2, HFpEF, OSA on CPAP, HTN, HLD, morbid obesity, chronic respiratory failure on 2 L oxygen, anxiety and depression.  Acute on chronic anemia due to vaginal bleeding Patient has 2-year history of vaginal bleeding which worsened 4 days before coming to the hospital.  Patient stated blood clots had increased in size from golf ball size to baseball size over the past several days and she had been soaking 2 pads with in 30 to 45 minutes.  Pelvic ultrasound in November showed endometrial thickness of 27.7 mm.  Baseline hemoglobin of 9, hemoglobin on admission of 7.3 with INR 1.1.  Transfusion threshold of 8 due to cardiac history.  She received total of 3 blood transfusions during her stay each with 1 unit of pRBCs.  GYN was consulted and patient was started on Megace 40 mg twice daily. At  discharge hemoglobin was stable at 8.9 and patient's vaginal bleeding has significantly slowed, is now able to go several hours without a soaked sanitary pad with much smaller blood clots.   Exertional chest pain Patient's chest pain was not responsive to nitro at home but did respond to nitro and ASA by EMS.  On admission troponin was low and trended from 6>5, BNP of 73.1 and CXR showed no acute findings and EKG showed no signs of MI.  Patient does have significant cardiac history (s/p remote LAD PCI and anterior STEMI in 2021 s/p PCI of mid LAD) but labs and studies were reassuring.  Chest pain resolved on her first day admitted and did not return.  Patient should follow-up with outpatient cardiology as soon as possible.  Prolonged QTc  QTc 520 on admission EKG. Recheck EKG outpt.  HFpEF Home medications including Entresto 24-26 mg BID, Lasix 40 mg daily, and Jardiance 10 mg daily were held due to low blood pressures (diastolics in the 62Z to 30Q).  Upon discharge blood pressures were stable and patient was discharged with instructions to resume Lasix and Jardiance.  She was instructed to add half a tablet of Entresto twice daily 2 days after discharge if she was feeling well and to hold off on resuming the full dose of 1 tablet twice daily until blood pressures were evaluated.  T2DM Patient was treated with Levemir 15 units twice daily and SSI.  Home gabapentin was continued and patient was placed on carb modified diet. CBG at discharge was 254.   Other conditions chronic and stable: OSA- CPAP nightly HLD - home atorvastatin continued Anxiety  and depression - home Prozac continued   Issues for follow up Patient will need colonoscopy outpatient. Ensure patient follows up with gynecology outpatient for biopsy.  Ensure follow-up with cardiology outpatient Recheck EKG (QTc prolongation on admission)  Follow up with primary care doctor for repeat CBC.  Evaluate blood pressure, add back full  dose of Entresto if appropriate   Significant Procedures: 1 U pRBCs transfused on 10/18/21, 10/19/21, and 10/20/21  Significant Labs and Imaging:  Recent Labs  Lab 10/20/21 0400 10/20/21 1514 10/21/21 0005 10/21/21 0738  WBC 10.7* 11.4*  --  11.0*  HGB 8.1* 7.9* 8.4* 8.9*  HCT 26.2* 25.8* 27.8* 28.0*  PLT 267 278  --  251   Recent Labs  Lab 10/18/21 2000 10/19/21 0040 10/19/21 0301 10/20/21 0400  NA 133* 139  --  135  K 3.8 3.6  --  4.3  CL 99 103  --  104  CO2 23 24  --  24  GLUCOSE 381* 107*  --  226*  BUN 14 12  --  9  CREATININE 1.16* 0.98  --  0.93  CALCIUM 8.2* 8.6*  --  8.1*  MG  --   --  2.2  --   ALKPHOS  --  57  --   --   AST  --  33  --   --   ALT  --  23  --   --   ALBUMIN  --  3.2*  --   --       Results/Tests Pending at Time of Discharge: none  Discharge Medications:  Allergies as of 10/21/2021       Reactions   Hydrocodone Shortness Of Breath   "I forget to breathe."    Clopidogrel Bisulfate    REACTION: rash   Hydrocodeine [dihydrocodeine]         Medication List     STOP taking these medications    diphenhydrAMINE 25 MG tablet Commonly known as: BENADRYL   Entresto 24-26 MG Generic drug: sacubitril-valsartan   prasugrel 10 MG Tabs tablet Commonly known as: EFFIENT       TAKE these medications    Aspirin Low Dose 81 MG EC tablet Generic drug: aspirin Take 1 tablet (81 mg total) by mouth daily.   atorvastatin 80 MG tablet Commonly known as: LIPITOR Take 1 tablet (80 mg total) by mouth every evening.   CALCIUM-MAGNESIUM-ZINC-D3 PO Take 1 tablet by mouth daily.   empagliflozin 10 MG Tabs tablet Commonly known as: JARDIANCE Take 1 tablet (10 mg total) by mouth daily.   FERROUS SULFATE PO Take 1 tablet by mouth daily.   FISH OIL PO Take 1 capsule by mouth daily.   FLUoxetine 20 MG capsule Commonly known as: PROzac Take 1 capsule (20 mg total) by mouth daily.   furosemide 40 MG tablet Commonly known as:  LASIX Take 1 tablet (40 mg total) by mouth daily.   gabapentin 300 MG capsule Commonly known as: NEURONTIN Take 1 capsule (300 mg total) by mouth at bedtime.   glucose blood test strip Commonly known as: True Metrix Blood Glucose Test Use as instructed What changed: when to take this   Insulin Pen Needle 30G X 8 MM Misc Commonly known as: NOVOFINE Use as directed with Novolin 70/30 insulin   Insulin Syringe-Needle U-100 30G X 1/2" 0.3 ML Misc use as directed 5 (five) times daily with insulin   Levemir FlexTouch 100 UNIT/ML FlexPen Generic drug: insulin detemir Inject 22 Units into  the skin 2 (two) times daily.   megestrol 40 MG tablet Commonly known as: MEGACE Take 1 tablet (40 mg total) by mouth 2 (two) times daily.   MIDOL PO Take 2 tablets by mouth 2 (two) times daily as needed (menstraul cramps).   nitroGLYCERIN 0.4 MG SL tablet Commonly known as: NITROSTAT Place 1 tablet (0.4 mg total) under the tongue every 5 (five) minutes as needed for chest pain (up to 3 doses. If taking 3rd dose call 911).   True Metrix Meter Devi 1 each by Does not apply route 3 (three) times daily.   TRUEplus Lancets 30G Misc 1 each by Does not apply route 3 (three) times daily.   Trulicity 1.5 BU/3.8GT Sopn Generic drug: Dulaglutide Inject 1.5 mg into the skin once a week. What changed: when to take this   VITAMIN B12 PO Take 1 tablet by mouth daily.        Discharge Instructions: Please refer to Patient Instructions section of EMR for full details.  Patient was counseled important signs and symptoms that should prompt return to medical care, changes in medications, dietary instructions, activity restrictions, and follow up appointments.   Follow-Up Appointments:  Follow-up Information     Charlott Rakes, MD. Go on 11/01/2021.   Specialty: Family Medicine Why: Please follow up at your appointment at 2:50 pm. Contact information: [place]  [address] [phone]         Sherren Mocha, MD. Schedule an appointment as soon as possible for a visit.   Specialty: Cardiology Why: Please make an appointment at your earliest convenience. Contact information: [address] [address] [phone]         Donnamae Jude, MD. Schedule an appointment as soon as possible for a visit on 11/09/2021.   Specialty: Obstetrics and Gynecology Why: Please follow up at your appointment at 1:35 pm. Contact information: [place] [address] [place], [place], DO 10/21/2021, 1:57 PM PGY-1, [place]

## 2021-10-21 NOTE — TOC CM/SW Note (Signed)
Spoke with patient who denies having any TOC needs. However, her medication was sent to her usual pharmacy that is closed on weekends. Mrs. Hupfer requests prescriptions be sent to [facility] on [address]. Message sent to MD and patient's nurse.  No further TOC needs assessed.    Marthenia Rolling, MSN, RN,BSN Inpatient [facility] Case Manager [phone]

## 2021-10-23 ENCOUNTER — Other Ambulatory Visit: Payer: Self-pay

## 2021-10-23 ENCOUNTER — Telehealth: Payer: Self-pay

## 2021-10-23 NOTE — Telephone Encounter (Signed)
From the discharge call:  She stated that she is feeling much better, really good. No questions or concerns at this time.  She said she has all medications and did not have any questions about the med regime.  She explained that she was told to hold the entresto until tomorrow , 10/24/2021, and then start taking 1/2 tablet twice daily for a week and then resume her full dose of entresto twice daily.   Scheduled to see Dr Margarita Rana - 11/01/2021.

## 2021-10-23 NOTE — Telephone Encounter (Signed)
Transition Care Management Follow-up Telephone Call Date of discharge and from where: 10/21/2021,  [facility] How have you been since you were released from the hospital? She stated that she is feeling much better, really good.  Any questions or concerns? No  Items Reviewed: Did the pt receive and understand the discharge instructions provided? Yes  Medications obtained and verified? Yes  - she said she has all medications and did not have any questions about the med regime.  She explained that she was told to hold the entresto until tomorrow , 10/24/2021, and then start taking 1/2 tablet twice daily for a week and then resume her full dose of entresto twice daily.  Other? No  Any new allergies since your discharge? No  Dietary orders reviewed? Yes Do you have support at home? Yes   Home Care and Equipment/Supplies: Were home health services ordered? no If so, what is the name of the agency? N/a  Has the agency set up a time to come to the patient's home? not applicable Were any new equipment or medical supplies ordered?  No What is the name of the medical supply agency? N/a Were you able to get the supplies/equipment? not applicable Do you have any questions related to the use of the equipment or supplies? No  Has glucometer and BP machine.   Functional Questionnaire: (I = Independent and D = Dependent) ADLs: independent.    Follow up appointments reviewed:  PCP Hospital f/u appt confirmed? Yes  Scheduled to see Dr Margarita Rana - 11/01/2021.  [facility] f/u appt confirmed? Yes  Scheduled to see cardiology - 11/09/2021. Are transportation arrangements needed? No  If their condition worsens, is the pt aware to call PCP or go to the Emergency Dept.? Yes Was the patient provided with contact information for the PCP's office or ED? Yes Was to pt encouraged to call back with questions or concerns? Yes

## 2021-10-27 ENCOUNTER — Telehealth: Payer: Self-pay | Admitting: Cardiovascular Disease

## 2021-10-27 NOTE — Telephone Encounter (Signed)
Pt c/o medication issue:  1. Name of Medications:  prasugrel (EFFIENT) tablet 10 mg    sacubitril-valsartan (ENTRESTO) 24-26 MG  2. How are you currently taking this medication (dosage and times per day)? Patient not taking the Effient. Patient is taking the Entresto  3. Are you having a reaction (difficulty breathing--STAT)?   4. What is your medication issue? Patient was under the impression that the entresto would replace the Effient. She saw her PCP Dr. Margarita Rana and was told that she should actually be on both medications.  She would like to know what Dr. Burt Knack thinks is best for her. She is not scheduled to see Richardson Dopp until 01/05/22

## 2021-10-30 ENCOUNTER — Other Ambulatory Visit: Payer: Self-pay | Admitting: *Deleted

## 2021-10-30 MED ORDER — PRASUGREL HCL 5 MG PO TABS: 5.0000 mg | ORAL_TABLET | Freq: Every day | ORAL | 11 refills | Status: DC

## 2021-10-30 NOTE — Telephone Encounter (Signed)
Pt with multiple ACS events and repeated stenting in the past. Allergic to plavix and unable to afford ticagrelor. She has tolerated effient and not out greater than 12 months from last PCI. Reasonable to keep on long-term DAPT, but would reduce effient dose to 5 mg daily. thanks

## 2021-10-30 NOTE — Telephone Encounter (Signed)
See note by Dr. Burt Knack Please make sure Effient 5 mg once daily is filled for her. Richardson Dopp, PA-C    10/30/2021 7:41 AM

## 2021-10-30 NOTE — Telephone Encounter (Signed)
S/w pt is aware of recommendations.  Pt will start effient one ( 1)  tablet ( 5 mg ) daily, sent into pts requested pharmacy.

## 2021-11-01 ENCOUNTER — Other Ambulatory Visit: Payer: Self-pay

## 2021-11-01 ENCOUNTER — Ambulatory Visit: Payer: Medicaid Other | Attending: Family Medicine | Admitting: Family Medicine

## 2021-11-01 VITALS — BP 111/72 | HR 99 | Ht 61.0 in | Wt 289.0 lb

## 2021-11-01 DIAGNOSIS — Z1231 Encounter for screening mammogram for malignant neoplasm of breast: Secondary | ICD-10-CM

## 2021-11-01 DIAGNOSIS — I2510 Atherosclerotic heart disease of native coronary artery without angina pectoris: Secondary | ICD-10-CM

## 2021-11-01 DIAGNOSIS — E1142 Type 2 diabetes mellitus with diabetic polyneuropathy: Secondary | ICD-10-CM

## 2021-11-01 DIAGNOSIS — Z9861 Coronary angioplasty status: Secondary | ICD-10-CM

## 2021-11-01 DIAGNOSIS — R9431 Abnormal electrocardiogram [ECG] [EKG]: Secondary | ICD-10-CM

## 2021-11-01 DIAGNOSIS — N939 Abnormal uterine and vaginal bleeding, unspecified: Secondary | ICD-10-CM

## 2021-11-01 DIAGNOSIS — D649 Anemia, unspecified: Secondary | ICD-10-CM

## 2021-11-01 MED ORDER — GABAPENTIN 300 MG PO CAPS
600.0000 mg | ORAL_CAPSULE | Freq: Every day | ORAL | 6 refills | Status: DC
  Filled 2021-11-01: qty 60, 30d supply, fill #0
  Filled 2021-12-20 – 2022-02-26 (×3): qty 60, 30d supply, fill #1

## 2021-11-01 NOTE — Progress Notes (Signed)
Subjective:  Patient ID: Brenda [name], female    DOB: 1961-05-17  Age: 61 y.o. MRN: [id number]  CC: Hospitalization Follow-up   HPI Brenda [name] is a 61 y.o. year old female with a history of type 2 diabetes mellitus (A1c 8.0), obstructive sleep apnea (on CPAP), hypertension, CAD status post multiple stent, STEMI in 10/2019,  HFpEF (EF 60-65%), chronic respiratory failure on 2L oxygen. She presents today for transition of care  after hospitalization at [facility] from 08/17/2022 through 10/21/2021. She had presented with chest pain and labs had revealed anemia with a hemoglobin of 7.3 secondary to abnormal uterine bleeding which required transfusion of PRBC. Acute coronary syndrome was ruled out but she did have an EKG with prolonged QTc of 520. She was placed on Megace by GYN for treatment of bleeding. Subsequently discharged with a hemoglobin of 8.9 to follow-up with GYN outpatient.  Interval History: She is on Megace and will be seeing GYN on 11/09/21.  I had previously referred her to GYN in the fall of last year but she informs me that due to her lack of medical coverage they would not see her. Pelvic ultrasound had revealed: IMPRESSION: 1. Endometrial thickness of 27.7 mm. In the setting of post-menopausal bleeding, endometrial sampling is indicated to exclude carcinoma. If results are benign, sonohysterogram should be considered for focal lesion work-up. (Ref: Radiological Reasoning: Algorithmic Workup of Abnormal Vaginal Bleeding with Endovaginal Sonography and Sonohysterography. AJR 2008; 323:F57-32) 2. Nonvisualized ovaries     She feels better today and bleeding has improved.  Doing well on her oxygen and does have some dyspnea at baseline which is stable.  I had referred her to cardiology and her appointment comes up next month.  Her blood sugars have been below 150, fasting sugars  are around 120.  Her neuropathy is not controlled but slightly improved  on gabapentin.  She experiences this as sharp pains and tingling in her feet at night. She states she has been approved for disability and medicaid Past Medical History:  Diagnosis Date   Arthritis    "back, hands, hips" (02/22/2015)   CAD (coronary artery disease)    a. complex LAD/diagonal bifurcation PCI in 2010. b. STEMI 10/2019 s/p PTCA/DES x1 to mLAD overlapping the old stent, residual disease treated medicaly.   Depression    Diabetes mellitus ([place])    "started when I was pregnant; not sure if it was type 1 or type 2"    Hypercholesterolemia    Hypertension    Morbid obesity ([place])    Sleep apnea    "suppose to wear mask; I don't" (02/22/2015)    Past Surgical History:  Procedure Laterality Date   CARDIAC CATHETERIZATION  12/2012   CHOLECYSTECTOMY OPEN  1989   CORONARY ANGIOPLASTY WITH STENT PLACEMENT  2011   CORONARY/GRAFT ACUTE MI REVASCULARIZATION N/A 10/26/2019   Procedure: CORONARY/GRAFT ACUTE MI REVASCULARIZATION;  Surgeon: Burnell Blanks, MD;  Location: [facility];  Service: Cardiovascular;  Laterality: N/A;   HERNIA REPAIR     LEFT HEART CATH AND CORONARY ANGIOGRAPHY N/A 10/26/2019   Procedure: LEFT HEART CATH AND CORONARY ANGIOGRAPHY;  Surgeon: Burnell Blanks, MD;  Location: [facility];  Service: Cardiovascular;  Laterality: N/A;   LEFT HEART CATHETERIZATION WITH CORONARY ANGIOGRAM N/A 12/25/2012   Procedure: LEFT HEART CATHETERIZATION WITH CORONARY ANGIOGRAM;  Surgeon: Sherren Mocha, MD;  Location: [facility] CATH LAB;  Service: Cardiovascular;  Laterality: N/A;   UMBILICAL HERNIA REPAIR  35's    Family History  Problem Relation Age of Onset   Heart attack Father        in his mid 5s   COPD Father    Coronary artery disease Mother        in her mid 70s   Hypertension Mother    Cancer Mother    Diabetes Sister    Coronary artery disease Sister        valve replacement   Stroke Sister        multiple    Allergies  Allergen  Reactions   Hydrocodone Shortness Of Breath    "I forget to breathe."    Clopidogrel Bisulfate     REACTION: rash   Hydrocodeine [Dihydrocodeine]     Outpatient Medications Prior to Visit  Medication Sig Dispense Refill   Acetaminophen (MIDOL PO) Take 2 tablets by mouth 2 (two) times daily as needed (menstraul cramps).     aspirin 81 MG EC tablet Take 1 tablet (81 mg total) by mouth daily. 30 tablet 11   atorvastatin (LIPITOR) 80 MG tablet Take 1 tablet (80 mg total) by mouth every evening. 30 tablet 6   Blood Glucose Monitoring Suppl (TRUE METRIX METER) DEVI 1 each by Does not apply route 3 (three) times daily. 1 Device 0   Cyanocobalamin (VITAMIN B12 PO) Take 1 tablet by mouth daily.     Dulaglutide (TRULICITY) 1.5 UY/4.0HK SOPN Inject 1.5 mg into the skin once a week. (Patient taking differently: Inject 1.5 mg into the skin every Wednesday.) 2 mL 6   empagliflozin (JARDIANCE) 10 MG TABS tablet Take 1 tablet (10 mg total) by mouth daily. 30 tablet 6   FERROUS SULFATE PO Take 1 tablet by mouth daily.     FLUoxetine (PROZAC) 20 MG capsule Take 1 capsule (20 mg total) by mouth daily. 30 capsule 6   furosemide (LASIX) 40 MG tablet Take 1 tablet (40 mg total) by mouth daily. 30 tablet 6   gabapentin (NEURONTIN) 300 MG capsule Take 1 capsule (300 mg total) by mouth at bedtime. 30 capsule 6   glucose blood (TRUE METRIX BLOOD GLUCOSE TEST) test strip Use as instructed (Patient taking differently: 3 (three) times daily. Use as instructed) 100 each 5   insulin detemir (LEVEMIR FLEXTOUCH) 100 UNIT/ML FlexPen Inject 22 Units into the skin 2 (two) times daily. 30 mL 6   Insulin Pen Needle (NOVOFINE) 30G X 8 MM MISC Use as directed with Novolin 70/30 insulin 100 each 1   Insulin Syringe-Needle U-100 30G X 1/2" 0.3 ML MISC use as directed 5 (five) times daily with insulin 150 each 2   megestrol (MEGACE) 40 MG tablet Take 1 tablet (40 mg total) by mouth 2 (two) times daily. 42 tablet 0   Multiple  Minerals-Vitamins (CALCIUM-MAGNESIUM-ZINC-D3 PO) Take 1 tablet by mouth daily.     nitroGLYCERIN (NITROSTAT) 0.4 MG SL tablet Place 1 tablet (0.4 mg total) under the tongue every 5 (five) minutes as needed for chest pain (up to 3 doses. If taking 3rd dose call 911). 25 tablet 3   Omega-3 Fatty Acids (FISH OIL PO) Take 1 capsule by mouth daily.     prasugrel (EFFIENT) 5 MG TABS tablet Take 1 tablet (5 mg total) by mouth daily. 30 tablet 11   TRUEPLUS LANCETS 30G MISC 1 each by Does not apply route 3 (three) times daily. 100 each 5   No facility-administered medications prior to visit.     ROS Review of  Systems  Constitutional:  Negative for activity change, appetite change and fatigue.  HENT:  Negative for congestion, sinus pressure and sore throat.   Eyes:  Negative for visual disturbance.  Respiratory:  Negative for cough, chest tightness, shortness of breath and wheezing.   Cardiovascular:  Negative for chest pain and palpitations.  Gastrointestinal:  Negative for abdominal distention, abdominal pain and constipation.  Endocrine: Negative for polydipsia.  Genitourinary:  Positive for menstrual problem. Negative for dysuria and frequency.  Musculoskeletal:  Negative for arthralgias and back pain.  Skin:  Negative for rash.  Neurological:  Negative for tremors, light-headedness and numbness.  Hematological:  Does not bruise/bleed easily.  Psychiatric/Behavioral:  Negative for agitation and behavioral problems.    Objective:  BP 111/72    Pulse 99    Ht 5\' 1"  (1.549 m)    Wt 289 lb (131.1 kg)    SpO2 99%    BMI 54.61 kg/m   BP/Weight 11/01/2021 10/21/2021 1/93/7902  Systolic BP 409 735 329  Diastolic BP 72 57 82  Wt. (Lbs) 289 294.5 294.8  BMI 54.61 55.65 55.7      Physical Exam Constitutional:      Appearance: She is well-developed. She is obese.  HENT:     Head:     Comments: On 2 L of oxygen via nasal cannula. Cardiovascular:     Rate and Rhythm: Normal rate.     Heart  sounds: Normal heart sounds. No murmur heard. Pulmonary:     Effort: Pulmonary effort is normal.     Breath sounds: Normal breath sounds. No wheezing or rales.  Chest:     Chest wall: No tenderness.  Abdominal:     General: Bowel sounds are normal. There is no distension.     Palpations: Abdomen is soft. There is no mass.     Tenderness: There is no abdominal tenderness.  Musculoskeletal:        General: Normal range of motion.     Right lower leg: No edema.     Left lower leg: No edema.  Neurological:     Mental Status: She is alert and oriented to person, place, and time.  Psychiatric:        Mood and Affect: Mood normal.    CMP Latest Ref Rng & Units 10/20/2021 10/19/2021 10/18/2021  Glucose 70 - 99 mg/dL 226(H) 107(H) 381(H)  BUN 6 - 20 mg/dL 9 12 14   Creatinine 0.44 - 1.00 mg/dL 0.93 0.98 1.16(H)  Sodium 135 - 145 mmol/L 135 139 133(L)  Potassium 3.5 - 5.1 mmol/L 4.3 3.6 3.8  Chloride 98 - 111 mmol/L 104 103 99  CO2 22 - 32 mmol/L 24 24 23   Calcium 8.9 - 10.3 mg/dL 8.1(L) 8.6(L) 8.2(L)  Total Protein 6.5 - 8.1 g/dL - 6.5 -  Total Bilirubin 0.3 - 1.2 mg/dL - 0.2(L) -  Alkaline Phos 38 - 126 U/L - 57 -  AST 15 - 41 U/L - 33 -  ALT 0 - 44 U/L - 23 -    Lipid Panel     Component Value Date/Time   CHOL 194 10/04/2021 0839   TRIG 185 (H) 10/04/2021 0839   HDL 49 10/04/2021 0839   CHOLHDL 2.2 12/16/2019 1041   CHOLHDL 3.5 10/27/2019 0219   VLDL 21 10/27/2019 0219   LDLCALC 113 (H) 10/04/2021 0839   LDLDIRECT 135.9 02/22/2011 1132    CBC    Component Value Date/Time   WBC 11.0 (H) 10/21/2021 9242  RBC 3.35 (L) 10/21/2021 0738   HGB 8.9 (L) 10/21/2021 0738   HCT 28.0 (L) 10/21/2021 0738   PLT 251 10/21/2021 0738   MCV 83.6 10/21/2021 0738   MCH 26.6 10/21/2021 0738   MCHC 31.8 10/21/2021 0738   RDW 17.3 (H) 10/21/2021 0738   LYMPHSABS 1.1 02/08/2021 1642   MONOABS 0.5 02/08/2021 1642   EOSABS 0.2 02/08/2021 1642   BASOSABS 0.1 02/08/2021 1642    Lab  Results  Component Value Date   HGBA1C 8.0 (A) 10/03/2021    Assessment & Plan:  1. CAD S/P PCI of LAD status post multiple stent, STEMI in 10/2019 Risk factor modification Continue statin  2. Symptomatic anemia Secondary to abnormal uterine bleed We will check CBC again as discharge hemoglobin was 8.9 - CBC with Differential/Platelet  3. QT prolongation Prolonged QTc of 520 ms when she was hospitalized Repeat EKG revealed QTc of 482 ms improved but slightly prolonged  4. Diabetic polyneuropathy associated with type 2 diabetes mellitus (HCC) Uncontrolled Increase gabapentin dose - gabapentin (NEURONTIN) 300 MG capsule; Take 2 capsules (600 mg total) by mouth at bedtime.  Dispense: 60 capsule; Refill: 6  5. Encounter for screening mammogram for malignant neoplasm of breast - MM 3D SCREEN BREAST BILATERAL; Future  6.  Abnormal uterine bleed Continue Megace Keep upcoming appointment with GYN   No orders of the defined types were placed in this encounter.   Return in about 1 month (around 12/02/2021) for Complete physical exam.       Charlott Rakes, MD, FAAFP. [facility] [place], [place]   11/01/2021, 3:19 PM

## 2021-11-01 NOTE — Progress Notes (Signed)
No concerns today 

## 2021-11-02 ENCOUNTER — Encounter: Payer: Self-pay | Admitting: Family Medicine

## 2021-11-02 ENCOUNTER — Telehealth: Payer: Self-pay

## 2021-11-02 LAB — CBC WITH DIFFERENTIAL/PLATELET
Basophils Absolute: 0.1 10*3/uL (ref 0.0–0.2)
Basos: 0 %
EOS (ABSOLUTE): 0.2 10*3/uL (ref 0.0–0.4)
Eos: 1 %
Hematocrit: 39 % (ref 34.0–46.6)
Hemoglobin: 12.2 g/dL (ref 11.1–15.9)
Immature Grans (Abs): 0 10*3/uL (ref 0.0–0.1)
Immature Granulocytes: 0 %
Lymphocytes Absolute: 2.3 10*3/uL (ref 0.7–3.1)
Lymphs: 16 %
MCH: 26.2 pg — ABNORMAL LOW (ref 26.6–33.0)
MCHC: 31.3 g/dL — ABNORMAL LOW (ref 31.5–35.7)
MCV: 84 fL (ref 79–97)
Monocytes Absolute: 0.9 10*3/uL (ref 0.1–0.9)
Monocytes: 6 %
Neutrophils Absolute: 10.9 10*3/uL — ABNORMAL HIGH (ref 1.4–7.0)
Neutrophils: 77 %
Platelets: 449 10*3/uL (ref 150–450)
RBC: 4.65 x10E6/uL (ref 3.77–5.28)
RDW: 18 % — ABNORMAL HIGH (ref 11.7–15.4)
WBC: 14.4 10*3/uL — ABNORMAL HIGH (ref 3.4–10.8)

## 2021-11-02 NOTE — Telephone Encounter (Signed)
Patient name and DOB has been verified ?Patient was informed of lab results. ?Patient had no questions.  ? ?Pt states that she is not taking a steroid. ?

## 2021-11-02 NOTE — Telephone Encounter (Signed)
-----   Message from Charlott Rakes, MD sent at 11/02/2021 12:37 PM EST ----- ?Please inform her that her hemoglobin is back to normal.  White cell count is a little bit elevated but this is not surprising given she is on a steroid. ?

## 2021-11-03 NOTE — Telephone Encounter (Signed)
She is on Megace which can cause elevated white blood cells. ?

## 2021-11-06 ENCOUNTER — Other Ambulatory Visit: Payer: Self-pay

## 2021-11-07 ENCOUNTER — Other Ambulatory Visit: Payer: Self-pay

## 2021-11-09 ENCOUNTER — Encounter: Payer: Self-pay | Admitting: Family Medicine

## 2021-11-09 ENCOUNTER — Other Ambulatory Visit: Payer: Self-pay

## 2021-11-09 ENCOUNTER — Ambulatory Visit (INDEPENDENT_AMBULATORY_CARE_PROVIDER_SITE_OTHER): Payer: Medicaid Other | Admitting: Family Medicine

## 2021-11-09 ENCOUNTER — Other Ambulatory Visit (HOSPITAL_COMMUNITY)
Admission: RE | Admit: 2021-11-09 | Discharge: 2021-11-09 | Disposition: A | Discharge status: Home / Self Care | Payer: Medicaid Other | Source: Ambulatory Visit | Attending: Family Medicine | Admitting: Family Medicine

## 2021-11-09 VITALS — BP 137/79 | Wt 293.4 lb

## 2021-11-09 DIAGNOSIS — Z124 Encounter for screening for malignant neoplasm of cervix: Secondary | ICD-10-CM | POA: Insufficient documentation

## 2021-11-09 DIAGNOSIS — N939 Abnormal uterine and vaginal bleeding, unspecified: Secondary | ICD-10-CM | POA: Insufficient documentation

## 2021-11-09 DIAGNOSIS — Z1231 Encounter for screening mammogram for malignant neoplasm of breast: Secondary | ICD-10-CM | POA: Diagnosis not present

## 2021-11-09 DIAGNOSIS — N898 Other specified noninflammatory disorders of vagina: Secondary | ICD-10-CM | POA: Diagnosis not present

## 2021-11-09 DIAGNOSIS — N938 Other specified abnormal uterine and vaginal bleeding: Secondary | ICD-10-CM

## 2021-11-09 NOTE — Progress Notes (Signed)
Subjective:    Patient ID: Brenda [name] is a 61 y.o. female presenting with AUB  on 11/09/2021  HPI: Menarche at age 89. Cycles were always irregular and have been heavy. In 2021 had no further periods x 10 years and then started bleeding in 2021 and has not stopped. Has been heavy for the last 2 years has been heavy with clots. Reports 2 years of bleeding until hospitalized with ABL anemia. She was seen by a partner and placed on Megace. Bleeding stopped until 4 days ago and now she is bleeding again. She is only spotting and has a vaginal discharge.  Review of Systems  Constitutional:  Negative for chills and fever.  Respiratory:  Negative for shortness of breath.   Cardiovascular:  Negative for chest pain.  Gastrointestinal:  Negative for abdominal pain, nausea and vomiting.  Genitourinary:  Positive for vaginal bleeding and vaginal discharge. Negative for dysuria.  Skin:  Negative for rash.     Objective:    BP 137/79   Wt 293 lb 6.4 oz (133.1 kg)   LMP 10/30/2014 (Approximate)   BMI 55.44 kg/m  Physical Exam Exam conducted with a chaperone present.  Constitutional:      General: She is not in acute distress.    Appearance: She is well-developed.  HENT:     Head: Normocephalic and atraumatic.  Eyes:     General: No scleral icterus. Cardiovascular:     Rate and Rhythm: Normal rate.  Pulmonary:     Effort: Pulmonary effort is normal.  Abdominal:     Palpations: Abdomen is soft.  Genitourinary:    Comments: Chronic yeast changes to vulva. Brown discharge. Cervix appears normal. Musculoskeletal:     Cervical back: Neck supple.  Skin:    General: Skin is warm and dry.  Neurological:     Mental Status: She is alert and oriented to person, place, and time.    Ultrasound from 07/13/2021 1. Endometrial thickness of 27.7 mm. In the setting of post-menopausal bleeding, endometrial sampling is indicated to exclude carcinoma. If results are benign, sonohysterogram  should be considered for focal lesion work-up. (Ref: Radiological Reasoning: Algorithmic Workup of Abnormal Vaginal Bleeding with Endovaginal Sonography and Sonohysterography. AJR 2008; 381:O17-51) 2. Nonvisualized ovaries  Procedure: Patient given informed consent, signed copy in the chart, time out was performed. Appropriate time out taken. . The patient was placed in the lithotomy position and the cervix brought into view with sterile speculum.  Portio of cervix cleansed x 2 with betadine swabs.  A tenaculum was placed in the anterior lip of the cervix.  The uterus was sounded for depth of 10 cm. A pipelle was introduced to into the uterus, suction created,  and an endometrial sample was obtained. All equipment was removed and accounted for.  The patient tolerated the procedure well.     Assessment & Plan:   Problem List Items Addressed This Visit       Unprioritized   Dysfunctional uterine bleeding    Vs. PMB-->no cycle x 10 years. Now with bleeding and anemia and quite thickened endometrium on u/s. Concern for endometrial hyperplasia and/or carcinoma given likely PCOS hx and obesity as risk factors + u/s findings and age. S/p Bx--will treat based on results. Would be a good candidate for IUD, as she would be a risky surgical candidate--continue Megace for now.      Other Visit Diagnoses     Vaginal discharge    -  Primary  Check wet prep   Relevant Orders   Cervicovaginal ancillary only( [place])   Breast cancer screening by mammogram       Pap smear for cervical cancer screening       Relevant Orders   Cytology - PAP( [place])   Abnormal uterine bleeding (AUB)       Relevant Orders   Surgical pathology( / [facility])        No follow-ups on file.  Donnamae Jude, MD 11/09/2021 1:55 PM

## 2021-11-09 NOTE — Assessment & Plan Note (Signed)
Vs. PMB-->no cycle x 10 years. Now with bleeding and anemia and quite thickened endometrium on u/s. Concern for endometrial hyperplasia and/or carcinoma given likely PCOS hx and obesity as risk factors + u/s findings and age. S/p Bx--will treat based on results. Would be a good candidate for IUD, as she would be a risky surgical candidate--continue Megace for now. ?

## 2021-11-09 NOTE — Progress Notes (Signed)
Patient in today for AUB. States that she had been bleeding for 2 years straight until she started Megace appx 2 weeks ago. After taking the Megace within 2 days she had stopped bleeding. Had discharge but then started bleeding again 4 days ago. States bleeding has saturated pads. Concerns for odor with discharge.  ? ?Altha Harm, CMA ? ?

## 2021-11-10 ENCOUNTER — Other Ambulatory Visit: Payer: Self-pay

## 2021-11-13 LAB — CERVICOVAGINAL ANCILLARY ONLY
Bacterial Vaginitis (gardnerella): NEGATIVE
Candida Glabrata: NEGATIVE
Candida Vaginitis: NEGATIVE
Comment: NEGATIVE
Comment: NEGATIVE
Comment: NEGATIVE

## 2021-11-14 LAB — CYTOLOGY - PAP
Comment: NEGATIVE
High risk HPV: NEGATIVE

## 2021-11-15 ENCOUNTER — Telehealth: Payer: Self-pay | Admitting: *Deleted

## 2021-11-15 ENCOUNTER — Other Ambulatory Visit: Payer: Self-pay

## 2021-11-15 ENCOUNTER — Telehealth: Payer: Self-pay | Admitting: Family Medicine

## 2021-11-15 ENCOUNTER — Encounter: Payer: Self-pay | Admitting: Gynecologic Oncology

## 2021-11-15 LAB — SURGICAL PATHOLOGY

## 2021-11-15 MED ORDER — MEGESTROL ACETATE 40 MG PO TABS
40.0000 mg | ORAL_TABLET | Freq: Two times a day (BID) | ORAL | 3 refills | Status: DC
  Filled 2021-11-15: qty 42, 21d supply, fill #0

## 2021-11-15 NOTE — Telephone Encounter (Signed)
Dr Kennon Rounds called the office and scheduled a new patient appt for 3/17 at 10:30 am ?

## 2021-11-15 NOTE — Telephone Encounter (Signed)
Called patient and gave her new and appointment with GYN Oncology. ?She needs a refill of her megace--rx sent in. ?

## 2021-11-16 ENCOUNTER — Other Ambulatory Visit: Payer: Self-pay

## 2021-11-16 NOTE — Progress Notes (Signed)
GYNECOLOGIC ONCOLOGY NEW PATIENT CONSULTATION  ? ?Patient Name: Brenda [name]  ?Patient Age: 61 y.o. ?Date of Service: 11/16/21 ?Referring Provider: Darron Doom, MD ? ?Primary Care Provider: Charlott Rakes, MD ?Consulting Provider: Jeral Pinch, MD  ? 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 ? ?We reviewed the nature of endometrial cancer and its recommended surgical staging, including total hysterectomy, bilateral salpingo-oophorectomy, and lymph node assessment.  I am concerned in the setting of her comorbidities, that the patient is not a suitable candidate for staging surgery via a minimally invasive approach.  Based on my exam today, I think that vaginal hysterectomy would be quite challenging.  I also discussed the morbidity related to surgery via laparotomy. ? ?We discussed that typically staging surgery is carried out via a minimally invasive approach.  This requires Trendelenburg positioning as well as insufflation, both which increase difficulty with ventilation.  This is compounded by the fact that the patient has underlying pulmonary and cardiac disease as well as obesity with a BMI over 50.  We will reach out to both her cardiologist as well as primary care physician.  I stressed that even if clearance for major surgery is given and the patient is deemed low or moderate risk, there is still a good likelihood that minimally invasive surgery would not be feasible. ? ?In the setting of inability to proceed with staging surgery, we discussed other treatment modalities in the setting of low-grade uterine cancer.  I discussed the use of progesterone as well as radiation.  I would like to plan to get a CT scan to rule out metastatic disease.  If CT confirms no evidence of metastatic disease, my recommendation if hysterectomy and staging surgery is not possible, that we proceed with dilation and curettage and Mirena IUD insertion. ? ?We reviewed the role of  progesterone therapy and the effect on preneoplastic and neoplastic lesions, believed to include induction of apoptosis in addition to tissue sloughing during withdrawal bleeding.  Activation of the progesterone receptors is believed to lead stromal decidualization and thinning of the lining.  We reviewed the 3 most studied options, to include levonorgesterol IUD, oral medorxyprogesterone acetate, or oral megesterol acetate.  The patient is currently on Megace, at a lower dose than I would typically use in somebody with biopsy proving endometrial cancer.  She initially had cessation of her bleeding but now has started bleeding again.  Additionally she is already experiencing increased appetite.  I discussed other more common side effects related to high-dose oral progesterone including edema, GI disturbances, and thromboembolic events.  These would all be side effects that we would want to avoid in her case.  Local progesterone via an IUD may have a stronger effect on the endometrium with less systemic side effects. ? ?For monitoring, she understands that there is no recommend standard of care.  We will base her surveillance on GOG 224 protocol.  This will include EMB at 3 months following initiation of treatment.  If persistence of CAH is noted by 6-9 months of treatment, then we will discuss additional agents, fitness/readiness for surgery, or the possibility of primary radiation.   ? ?We reviewed the role that excess estrogen plays in the pathogenesis of endometrial hyperplasia as well as endometrial cancer.  If staging surgery is not possible, I recommend that we drive towards achieving weight loss to decrease her endogenous estrogen production.  With weight loss, which may impact some of her other comorbid conditions, it is possible that surgery  could be feasible in the future.   ? ?We will tentatively plan to have the patient return in about a week and a half for a preoperative visit.  CT scan is scheduled for  3/21.  My office will reach out to her cardiologist and PCP regarding clearance and recommendations. ? ?A copy of this note was sent to the patient's referring provider.  ? ?75 minutes of total time was spent for this patient encounter, including preparation, face-to-face counseling with the patient and coordination of care, and documentation of the encounter. ? ? ?Jeral Pinch, MD  ?Division of Gynecologic Oncology  ?Department of Obstetrics and Gynecology  ?[facility]  ?___________________________________________  ?Chief Complaint: ?Chief Complaint  ?Patient presents with  ? Endometrial cancer [facility])  ? ? ?History of Present Illness:  ?Brenda [name] is a 61 y.o. y.o. female who is seen in consultation at the request of Dr. Kennon Rounds for an evaluation of endometrial adenocarcinoma. ? ?The patient reports menopause in 2011.  She had approximately 10 years with no bleeding and then started having vaginal bleeding 2 or 3 years ago.  She describes this as bleeding daily, at times heavy, at times light.  She would have occasional cramping.  She underwent pelvic ultrasound showing mildly enlarged uterus with thickened endometrium in late 2022.  In February of this year, she was hospitalized in the setting of acute on chronic anemia due to postmenopausal bleeding and likely causing nonexertional chest pain.  Her hemoglobin was noted to be 7.3.  The patient received a total of 3 units of packed red blood cells with a discharge hemoglobin of 9.  She was started on Megace 40 mg twice daily.  Her bleeding stopped completely for about a week but has since restarted.  She now changes superlong always pads about every hour, notes that they are mostly fall.  She denies any passage of clots at this time.  She has had some pelvic cramping the last few weeks as well as low back pain. ? ?Patient was seen on 3/23 for follow-up.  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 ? ?She notes being diagnosed with PCOS in her 18s. ? ?She endorses regular bowel function.  She denies any urinary symptoms.  She describes having increased appetite since starting the Megace, denies any nausea or emesis. ? ?Her medical history is notable for obesity with a BMI over 50.  She has insulin-dependent diabetes, last point-of-care hemoglobin A1c was 8.0% at the end of January.  She checks her sugars at home and notes that they typically range between 120 and 180.  She has OSA, uses a CPAP regularly.  Was diagnosed with respiratory failure in June of last year and is oxygen dependent on 2 L now after hospitalization where she was admitted with acute right heart failure.  She has a history of coronary artery disease and has had multiple stents placed.  She had myocardial infarction in 2021. ? ?She comes in with her husband today.  She is on disability.  Her nephew lives with them.  She is his legal guardian as his mother passed away.  Family history is notable for a half sister with precancer of the cervix.  Mom had spine cancer about 20 years ago which was treated with radiation and chemotherapy. ?  ?PAST MEDICAL HISTORY:  ?Past Medical History:  ?Diagnosis Date  ? Arthritis   ? "back, hands, hips" (02/22/2015)  ? CAD (coronary artery disease)   ?  a. complex LAD/diagonal bifurcation PCI in 2010. b. STEMI 10/2019 s/p PTCA/DES x1 to mLAD overlapping the old stent, residual disease treated medicaly.  ? Depression   ? Diabetes mellitus ([place])   ? "started when I was pregnant; not sure if it was type 1 or type 2"   ? Hypercholesterolemia   ? Hypertension   ? Morbid obesity ([place])   ? Sleep apnea   ? "suppose to wear mask; I don't" (02/22/2015)  ?  ? ?PAST SURGICAL HISTORY:  ?Past Surgical History:  ?Procedure Laterality Date  ? CARDIAC CATHETERIZATION  12/02/2012  ? CHOLECYSTECTOMY OPEN  09/04/1987  ? CORONARY ANGIOPLASTY WITH STENT PLACEMENT  09/03/2009  ? CORONARY/GRAFT ACUTE MI  REVASCULARIZATION N/A 10/26/2019  ? Procedure: CORONARY/GRAFT ACUTE MI REVASCULARIZATION;  Surgeon: Burnell Blanks, MD;  Location: [place] [facility];  Service: Cardiovascular;  Laterality: N/A;  ? LEFT H

## 2021-11-16 NOTE — Telephone Encounter (Signed)
Megace Rx sent in by Dr Kennon Rounds 11/15/2021 ?Melicia Esqueda,RN  ?

## 2021-11-16 NOTE — H&P (View-Only) (Signed)
GYNECOLOGIC ONCOLOGY NEW PATIENT CONSULTATION  ? ?Patient Name: Brenda [name]  ?Patient Age: 61 y.o. ?Date of Service: 11/16/21 ?Referring Provider: Darron Doom, MD ? ?Primary Care Provider: Charlott Rakes, MD ?Consulting Provider: Jeral Pinch, MD  ? 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 ? ?We reviewed the nature of endometrial cancer and its recommended surgical staging, including total hysterectomy, bilateral salpingo-oophorectomy, and lymph node assessment.  I am concerned in the setting of her comorbidities, that the patient is not a suitable candidate for staging surgery via a minimally invasive approach.  Based on my exam today, I think that vaginal hysterectomy would be quite challenging.  I also discussed the morbidity related to surgery via laparotomy. ? ?We discussed that typically staging surgery is carried out via a minimally invasive approach.  This requires Trendelenburg positioning as well as insufflation, both which increase difficulty with ventilation.  This is compounded by the fact that the patient has underlying pulmonary and cardiac disease as well as obesity with a BMI over 50.  We will reach out to both her cardiologist as well as primary care physician.  I stressed that even if clearance for major surgery is given and the patient is deemed low or moderate risk, there is still a good likelihood that minimally invasive surgery would not be feasible. ? ?In the setting of inability to proceed with staging surgery, we discussed other treatment modalities in the setting of low-grade uterine cancer.  I discussed the use of progesterone as well as radiation.  I would like to plan to get a CT scan to rule out metastatic disease.  If CT confirms no evidence of metastatic disease, my recommendation if hysterectomy and staging surgery is not possible, that we proceed with dilation and curettage and Mirena IUD insertion. ? ?We reviewed the role of  progesterone therapy and the effect on preneoplastic and neoplastic lesions, believed to include induction of apoptosis in addition to tissue sloughing during withdrawal bleeding.  Activation of the progesterone receptors is believed to lead stromal decidualization and thinning of the lining.  We reviewed the 3 most studied options, to include levonorgesterol IUD, oral medorxyprogesterone acetate, or oral megesterol acetate.  The patient is currently on Megace, at a lower dose than I would typically use in somebody with biopsy proving endometrial cancer.  She initially had cessation of her bleeding but now has started bleeding again.  Additionally she is already experiencing increased appetite.  I discussed other more common side effects related to high-dose oral progesterone including edema, GI disturbances, and thromboembolic events.  These would all be side effects that we would want to avoid in her case.  Local progesterone via an IUD may have a stronger effect on the endometrium with less systemic side effects. ? ?For monitoring, she understands that there is no recommend standard of care.  We will base her surveillance on GOG 224 protocol.  This will include EMB at 3 months following initiation of treatment.  If persistence of CAH is noted by 6-9 months of treatment, then we will discuss additional agents, fitness/readiness for surgery, or the possibility of primary radiation.   ? ?We reviewed the role that excess estrogen plays in the pathogenesis of endometrial hyperplasia as well as endometrial cancer.  If staging surgery is not possible, I recommend that we drive towards achieving weight loss to decrease her endogenous estrogen production.  With weight loss, which may impact some of her other comorbid conditions, it is possible that surgery  could be feasible in the future.   ? ?We will tentatively plan to have the patient return in about a week and a half for a preoperative visit.  CT scan is scheduled for  3/21.  My office will reach out to her cardiologist and PCP regarding clearance and recommendations. ? ?A copy of this note was sent to the patient's referring provider.  ? ?75 minutes of total time was spent for this patient encounter, including preparation, face-to-face counseling with the patient and coordination of care, and documentation of the encounter. ? ? ?Jeral Pinch, MD  ?Division of Gynecologic Oncology  ?Department of Obstetrics and Gynecology  ?[facility]  ?___________________________________________  ?Chief Complaint: ?Chief Complaint  ?Patient presents with  ? Endometrial cancer [facility])  ? ? ?History of Present Illness:  ?Brenda [name] is a 61 y.o. y.o. female who is seen in consultation at the request of Dr. Kennon Rounds for an evaluation of endometrial adenocarcinoma. ? ?The patient reports menopause in 2011.  She had approximately 10 years with no bleeding and then started having vaginal bleeding 2 or 3 years ago.  She describes this as bleeding daily, at times heavy, at times light.  She would have occasional cramping.  She underwent pelvic ultrasound showing mildly enlarged uterus with thickened endometrium in late 2022.  In February of this year, she was hospitalized in the setting of acute on chronic anemia due to postmenopausal bleeding and likely causing nonexertional chest pain.  Her hemoglobin was noted to be 7.3.  The patient received a total of 3 units of packed red blood cells with a discharge hemoglobin of 9.  She was started on Megace 40 mg twice daily.  Her bleeding stopped completely for about a week but has since restarted.  She now changes superlong always pads about every hour, notes that they are mostly fall.  She denies any passage of clots at this time.  She has had some pelvic cramping the last few weeks as well as low back pain. ? ?Patient was seen on 3/23 for follow-up.  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 ? ?She notes being diagnosed with PCOS in her 67s. ? ?She endorses regular bowel function.  She denies any urinary symptoms.  She describes having increased appetite since starting the Megace, denies any nausea or emesis. ? ?Her medical history is notable for obesity with a BMI over 50.  She has insulin-dependent diabetes, last point-of-care hemoglobin A1c was 8.0% at the end of January.  She checks her sugars at home and notes that they typically range between 120 and 180.  She has OSA, uses a CPAP regularly.  Was diagnosed with respiratory failure in June of last year and is oxygen dependent on 2 L now after hospitalization where she was admitted with acute right heart failure.  She has a history of coronary artery disease and has had multiple stents placed.  She had myocardial infarction in 2021. ? ?She comes in with her husband today.  She is on disability.  Her nephew lives with them.  She is his legal guardian as his mother passed away.  Family history is notable for a half sister with precancer of the cervix.  Mom had spine cancer about 20 years ago which was treated with radiation and chemotherapy. ?  ?PAST MEDICAL HISTORY:  ?Past Medical History:  ?Diagnosis Date  ? Arthritis   ? "back, hands, hips" (02/22/2015)  ? CAD (coronary artery disease)   ?  a. complex LAD/diagonal bifurcation PCI in 2010. b. STEMI 10/2019 s/p PTCA/DES x1 to mLAD overlapping the old stent, residual disease treated medicaly.  ? Depression   ? Diabetes mellitus ([place])   ? "started when I was pregnant; not sure if it was type 1 or type 2"   ? Hypercholesterolemia   ? Hypertension   ? Morbid obesity ([place])   ? Sleep apnea   ? "suppose to wear mask; I don't" (02/22/2015)  ?  ? ?PAST SURGICAL HISTORY:  ?Past Surgical History:  ?Procedure Laterality Date  ? CARDIAC CATHETERIZATION  12/02/2012  ? CHOLECYSTECTOMY OPEN  09/04/1987  ? CORONARY ANGIOPLASTY WITH STENT PLACEMENT  09/03/2009  ? CORONARY/GRAFT ACUTE MI  REVASCULARIZATION N/A 10/26/2019  ? Procedure: CORONARY/GRAFT ACUTE MI REVASCULARIZATION;  Surgeon: Burnell Blanks, MD;  Location: [facility];  Service: Cardiovascular;  Laterality: N/A;  ? LEFT H

## 2021-11-17 ENCOUNTER — Inpatient Hospital Stay: Payer: Medicaid Other | Attending: Gynecologic Oncology | Admitting: Gynecologic Oncology

## 2021-11-17 ENCOUNTER — Other Ambulatory Visit: Payer: Self-pay

## 2021-11-17 ENCOUNTER — Telehealth: Payer: Self-pay | Admitting: *Deleted

## 2021-11-17 ENCOUNTER — Encounter: Payer: Self-pay | Admitting: Gynecologic Oncology

## 2021-11-17 ENCOUNTER — Inpatient Hospital Stay: Payer: Medicaid Other

## 2021-11-17 VITALS — BP 159/66 | HR 86 | Temp 97.8°F | Resp 18 | Ht 62.6 in | Wt 300.0 lb

## 2021-11-17 DIAGNOSIS — M1990 Unspecified osteoarthritis, unspecified site: Secondary | ICD-10-CM | POA: Insufficient documentation

## 2021-11-17 DIAGNOSIS — Z9981 Dependence on supplemental oxygen: Secondary | ICD-10-CM | POA: Insufficient documentation

## 2021-11-17 DIAGNOSIS — C541 Malignant neoplasm of endometrium: Secondary | ICD-10-CM | POA: Diagnosis present

## 2021-11-17 DIAGNOSIS — I50811 Acute right heart failure: Secondary | ICD-10-CM | POA: Diagnosis not present

## 2021-11-17 DIAGNOSIS — Z7982 Long term (current) use of aspirin: Secondary | ICD-10-CM | POA: Diagnosis not present

## 2021-11-17 DIAGNOSIS — J9611 Chronic respiratory failure with hypoxia: Secondary | ICD-10-CM

## 2021-11-17 DIAGNOSIS — Z7985 Long-term (current) use of injectable non-insulin antidiabetic drugs: Secondary | ICD-10-CM | POA: Insufficient documentation

## 2021-11-17 DIAGNOSIS — I2510 Atherosclerotic heart disease of native coronary artery without angina pectoris: Secondary | ICD-10-CM | POA: Diagnosis not present

## 2021-11-17 DIAGNOSIS — Z794 Long term (current) use of insulin: Secondary | ICD-10-CM | POA: Insufficient documentation

## 2021-11-17 DIAGNOSIS — E7800 Pure hypercholesterolemia, unspecified: Secondary | ICD-10-CM | POA: Diagnosis not present

## 2021-11-17 DIAGNOSIS — Z955 Presence of coronary angioplasty implant and graft: Secondary | ICD-10-CM | POA: Insufficient documentation

## 2021-11-17 DIAGNOSIS — Z79899 Other long term (current) drug therapy: Secondary | ICD-10-CM | POA: Insufficient documentation

## 2021-11-17 DIAGNOSIS — E119 Type 2 diabetes mellitus without complications: Secondary | ICD-10-CM | POA: Diagnosis not present

## 2021-11-17 DIAGNOSIS — Z7984 Long term (current) use of oral hypoglycemic drugs: Secondary | ICD-10-CM | POA: Diagnosis not present

## 2021-11-17 DIAGNOSIS — F32A Depression, unspecified: Secondary | ICD-10-CM | POA: Diagnosis not present

## 2021-11-17 DIAGNOSIS — Z6841 Body Mass Index (BMI) 40.0 and over, adult: Secondary | ICD-10-CM | POA: Diagnosis not present

## 2021-11-17 DIAGNOSIS — I252 Old myocardial infarction: Secondary | ICD-10-CM | POA: Diagnosis not present

## 2021-11-17 DIAGNOSIS — E118 Type 2 diabetes mellitus with unspecified complications: Secondary | ICD-10-CM

## 2021-11-17 DIAGNOSIS — G4733 Obstructive sleep apnea (adult) (pediatric): Secondary | ICD-10-CM | POA: Diagnosis not present

## 2021-11-17 DIAGNOSIS — I110 Hypertensive heart disease with heart failure: Secondary | ICD-10-CM | POA: Insufficient documentation

## 2021-11-17 LAB — BASIC METABOLIC PANEL
Anion gap: 8 (ref 5–15)
BUN: 14 mg/dL (ref 6–20)
CO2: 27 mmol/L (ref 22–32)
Calcium: 9.5 mg/dL (ref 8.9–10.3)
Chloride: 103 mmol/L (ref 98–111)
Creatinine, Ser: 0.88 mg/dL (ref 0.44–1.00)
GFR, Estimated: >60 mL/min (ref >60)
Glucose, Bld: 152 mg/dL — ABNORMAL HIGH (ref 70–99)
Potassium: 4 mmol/L (ref 3.5–5.1)
Sodium: 138 mmol/L (ref 135–145)

## 2021-11-17 NOTE — Telephone Encounter (Signed)
Spoke with pt this afternoon and informed pt that her lab results came back and shows that kidney functions are normal. Glucose is slightly elevated but it was not a fasting blood glucose. We will fax these results to her PCP as well. Pt verbalized understanding and did not voice any further concerns.  ?

## 2021-11-17 NOTE — Patient Instructions (Addendum)
It is very nice to meet you today. ? ?As we discussed, I am concerned about your ability to tolerate surgery given your multiple medical issues.  I will have my office send clearance forms to your cardiologist as well as your primary care provider.  Please reach out to both offices in the next week to see if you need an appointment to see them.  ? ?Based on your exam, I do not think that you are a good candidate for a vaginal hysterectomy.  I have concerns about our ability to help you breathe well enough during surgery and the position that I need GYN for laparoscopic surgery.  If we do not get clearance for major surgery or if you do not tolerate being in the position needed for surgery, today we discussed the plan to use progesterone therapy to treat your cancer.   ? ?We will get you scheduled for a CT scan to look for any evidence of cancer spread.  To help avoid some of the side effects with progesterone when we use it in pill form, my recommendation would be to place a progesterone releasing intrauterine device at the time of surgery (surgery would include a D&C and Mirena IUD insertion). ? ?We will hold surgery time for you in December 06, 2021. We will have you scheduled for robotic assisted total laparoscopic hysterectomy (removal of the uterus and cervix), bilateral salpingo-oophorectomy (removal of both ovaries and fallopian tubes), sentinel lymph node biopsy, possible lymph node dissection, possible laparotomy (larger incision on your abdomen if needed), possible dilation and curettage of the uterus with Mirena intrauterine device placement if unable to tolerate positioning.  ? ?We will bring you back closer to the surgery date to have a pre-op appointment with Joylene John NP to discuss instructions for before and after surgery. You will also receive a phone call from the hospital to arrange for a pre-op appointment there as well. We can usually do these two appointments on the same day.     ?

## 2021-11-20 ENCOUNTER — Telehealth: Payer: Self-pay | Admitting: *Deleted

## 2021-11-20 ENCOUNTER — Telehealth: Payer: Self-pay

## 2021-11-20 ENCOUNTER — Telehealth: Payer: Self-pay | Admitting: Family Medicine

## 2021-11-20 NOTE — Telephone Encounter (Signed)
States she was newly diagnosed with uterine cancer and needs a hysterectomy. But the surgeon doesn't feel she is a good candidate d/t blood sugars and heart problems.  ? ?It was suggested she try ozempic to loose weight and get A1C down.  ? ?Last A1C was 8.0 on January 31,2023.  ?Please advise if OV is needed.  ?

## 2021-11-20 NOTE — Telephone Encounter (Signed)
Not on any anticoagulants.. Routing back to pre op regarding Prasugrel ?

## 2021-11-20 NOTE — Telephone Encounter (Signed)
Yes I agree with holding prasugrel for at least 7 days.  If possible it would be best for her to stay on low-dose aspirin.  If absolutely necessary to hold aspirin this is acceptable as well.  Thanks ?

## 2021-11-20 NOTE — Patient Instructions (Addendum)
DUE TO COVID-19 ONLY ONE VISITOR  (aged 59 and older)  IS ALLOWED TO COME WITH YOU AND STAY IN THE WAITING ROOM ONLY DURING PRE OP AND PROCEDURE.   ?**NO VISITORS ARE ALLOWED IN THE SHORT STAY AREA OR RECOVERY ROOM!!** ? ?IF YOU WILL BE ADMITTED INTO THE HOSPITAL YOU ARE ALLOWED ONLY TWO SUPPORT PEOPLE DURING VISITATION HOURS ONLY (7 AM -8PM)   ?The support person(s) must pass our screening, gel in and out, and wear a mask at all times, including in the patient?s room. ?Patients must also wear a mask when staff or their support person are in the room. ?Visitors GUEST BADGE MUST BE WORN VISIBLY  ?One adult visitor may remain with you overnight and MUST be in the room by 8 P.M. ?  ? ? Your procedure is scheduled on: 12-06-21 ? ? Report to [facility] Main Entrance ? ?  Report to admitting at     1215  PM ? ? Call this number if you have problems the morning of surgery [phone] ? ? ?Eat a light diet the day before surgery.  Examples including soups, broths, toast, yogurt, mashed potatoes.  Things to avoid include carbonated beverages (fizzy beverages), raw fruits and raw vegetables, or beans.  ? ?If your bowels are filled with gas, your surgeon will have difficulty visualizing your pelvic organs which increases your surgical risks.  ? ? ? Do not eat food :After Midnight. ? ? After Midnight you may have the following liquids until _1130 am _____ DAY OF SURGERY ? ?Water ?Black Coffee (sugar ok, NO MILK/CREAM OR CREAMERS)  ?Tea (sugar ok, NO MILK/CREAM OR CREAMERS) regular and decaf                             ?Plain Jell-O (NO RED)                                           ?Fruit ices (not with fruit pulp, NO RED)                                     ?Popsicles (NO RED)                                                                  ?Juice: apple, WHITE grape, WHITE cranberry ?Sports drinks like Gatorade (NO RED) ?Clear broth(vegetable,chicken,beef) ? ?             ? ?  ?       If you have questions, please  contact your surgeon?s office. ? ? ?  ?  ?Oral Hygiene is also important to reduce your risk of infection.                                    ?Remember - BRUSH YOUR TEETH THE MORNING OF SURGERY WITH YOUR REGULAR TOOTHPASTE ? ? Do NOT smoke after Midnight ? ? Take these medicines the morning of  surgery with A SIP OF WATER:  ? ?DO NOT TAKE ANY ORAL DIABETIC MEDICATIONS DAY OF YOUR SURGERY ? ?Bring CPAP mask and tubing day of surgery. ?                  ?           You may not have any metal on your body including hair pins, jewelry, and body piercing ? ?           Do not wear make-up, lotions, powders, perfumes/cologne, or deodorant ? ?Do not wear nail polish including gel and S&S, artificial/acrylic nails, or any other type of covering on natural nails including finger and toenails. If you have artificial nails, gel coating, etc. that needs to be removed by a nail salon please have this removed prior to surgery or surgery may need to be canceled/ delayed if the surgeon/ anesthesia feels like they are unable to be safely monitored.  ? ?Do not shave  48 hours prior to surgery.  ? ?           ? ? Do not bring valuables to the hospital. [facility] NOT ?            RESPONSIBLE   FOR VALUABLES. ? ? Contacts, dentures or bridgework may not be worn into surgery. ? ? Bring small overnight bag day of surgery. ?  ? Patients discharged on the day of surgery will not be allowed to drive home.  Someone NEEDS to stay with you for the first 24 hours after anesthesia. ? ? Special Instructions: Bring a copy of your healthcare power of attorney and living will documents         the day of surgery if you haven't scanned them before. ? ?            Please read over the following fact sheets you were given: IF [place] [phone] ? ?   [facility] - Preparing for Surgery ?Before surgery, you can play an important role.  Because skin is not sterile, your skin needs to be as free of  germs as possible.  You can reduce the number of germs on your skin by washing with CHG (chlorahexidine gluconate) soap before surgery.  CHG is an antiseptic cleaner which kills germs and bonds with the skin to continue killing germs even after washing. ?Please DO NOT use if you have an allergy to CHG or antibacterial soaps.  If your skin becomes reddened/irritated stop using the CHG and inform your nurse when you arrive at Short Stay. ?Do not shave (including legs and underarms) for at least 48 hours prior to the first CHG shower.  You may shave your face/neck. ?Please follow these instructions carefully: ? 1.  Shower with CHG Soap the night before surgery and the  morning of Surgery. ? 2.  If you choose to wash your hair, wash your hair first as usual with your  normal  shampoo. ? 3.  After you shampoo, rinse your hair and body thoroughly to remove the  shampoo.                           4.  Use CHG as you would any other liquid soap.  You can apply chg directly  to the skin and wash  ?  Gently with a scrungie or clean washcloth. ? 5.  Apply the CHG Soap to your body ONLY FROM THE NECK DOWN.   Do not use on face/ open      ?                     Wound or open sores. Avoid contact with eyes, ears mouth and genitals (private parts).  ?                     Production manager,  Genitals (private parts) with your normal soap. ?            6.  Wash thoroughly, paying special attention to the area where your surgery  will be performed. ? 7.  Thoroughly rinse your body with warm water from the neck down. ? 8.  DO NOT shower/wash with your normal soap after using and rinsing off  the CHG Soap. ?               9.  Pat yourself dry with a clean towel. ?           10.  Wear clean pajamas. ?           11.  Place clean sheets on your bed the night of your first shower and do not  sleep with pets. ?Day of Surgery : ?Do not apply any lotions/deodorants the morning of surgery.  Please wear clean clothes to the  hospital/surgery center. ? ?FAILURE TO FOLLOW THESE INSTRUCTIONS MAY RESULT IN THE CANCELLATION OF YOUR SURGERY ?PATIENT SIGNATURE_________________________________ ? ?NURSE SIGNATURE__________________________________ ? ?__How to Manage Your Diabetes ?Before and After Surgery ? ?Why is it important to control my blood sugar before and after surgery? ?Improving blood sugar levels before and after surgery helps healing and can limit problems. ?A way of improving blood sugar control is eating a healthy diet by: ? Eating less sugar and carbohydrates ? Increasing activity/exercise ? Talking with your doctor about reaching your blood sugar goals ?High blood sugars (greater than 180 mg/dL) can raise your risk of infections and slow your recovery, so you will need to focus on controlling your diabetes during the weeks before surgery. ?Make sure that the doctor who takes care of your diabetes knows about your planned surgery including the date and location. ? ?How do I manage my blood sugar before surgery? ?Check your blood sugar at least 4 times a day, starting 2 days before surgery, to make sure that the level is not too high or low. ?Check your blood sugar the morning of your surgery when you wake up and every 2 hours until you get to the Short Stay unit. ?If your blood sugar is less than 70 mg/dL, you will need to treat for low blood sugar: ?Do not take insulin. ?Treat a low blood sugar (less than 70 mg/dL) with ? cup of clear juice (cranberry or apple), 4 glucose tablets, OR glucose gel. ?Recheck blood sugar in 15 minutes after treatment (to make sure it is greater than 70 mg/dL). If your blood sugar is not greater than 70 mg/dL on recheck, call [phone] for further instructions. ?Report your blood sugar to the short stay nurse when you get to Short Stay. ? ?If you are admitted to the hospital after surgery: ?Your blood sugar will be checked by the staff and you will probably be given insulin after surgery (instead  of oral diabetes medicines) to make sure you have good blood  sugar levels. ?The goal for blood sugar control after surgery is 80-180 mg/dL. ? ? ?WHAT DO I DO ABOUT MY DIABETES MEDICATION? ? ?Do not take oral diabe

## 2021-11-20 NOTE — Telephone Encounter (Signed)
Per Dr Berline Lopes fax records and surgical optimization form to the patient's PCP and cardiology doctors  ?

## 2021-11-20 NOTE — Telephone Encounter (Signed)
Please place on my schedule or Luke's schedule or any provider with an earlier appointment to discuss this Ozempic.  She can also be placed on the wait list for faster access. ?

## 2021-11-20 NOTE — Telephone Encounter (Signed)
Pt called in about is she ok to go off the Ozempic. Please call back ?

## 2021-11-20 NOTE — Telephone Encounter (Signed)
Dr. Burt Knack ?Pt is seeing Laurann Montana on 11/22/21 for preop clearance for lap hysterectomy.  ? ?She has a complex history of CAD and we are asked to hold ASA, although I believe they meant to ask about effient.  ? ?If Urban Gibson recommends proceeding with surgery, do you recommend holding effient and continuing ASA throughout surgery? ? ?Note surgery on 12/12/21 - greater than 12 months from PCI.  ? ?I will copy Caitlin on this and remove from preop.  ?

## 2021-11-20 NOTE — Telephone Encounter (Signed)
? ?  Pre-operative Risk Assessment  ?  ?Patient Name: Brenda [name]  ?DOB: [birth year] ?MRN: [id number]  ? ?  ? ?Request for Surgical Clearance   ? ?Procedure:   Robotic assisted total laparoscopic hysterectomy, bilateral salpingo-oophorectomy, sentinel lymph node biopsy, possible lymph node dissection, pass laparotomy, possible D+C with IUD placement ? ?Date of Surgery:  Clearance 12/12/21                              ?   ?Surgeon:  Dr. Jeral Pinch ?Surgeon's Group or Practice Name:  [facility] Gy[facility] ?Phone number:  [phone] ?Fax number:  [phone] ?  ?Type of Clearance Requested:   ?- Medical  ?- Pharmacy:  Hold Aspirin   ?  ?Type of Anesthesia:  General  ?  ?Additional requests/questions:   None ? ?Signed, ?Vishaal Strollo   ?11/20/2021, 11:45 AM  ? ?

## 2021-11-21 ENCOUNTER — Other Ambulatory Visit: Payer: Self-pay

## 2021-11-21 ENCOUNTER — Encounter (HOSPITAL_COMMUNITY): Payer: Self-pay

## 2021-11-21 ENCOUNTER — Ambulatory Visit (HOSPITAL_COMMUNITY)
Admission: RE | Admit: 2021-11-21 | Discharge: 2021-11-21 | Disposition: A | Discharge status: Home / Self Care | Payer: Medicaid Other | Source: Ambulatory Visit | Attending: Gynecologic Oncology | Admitting: Gynecologic Oncology

## 2021-11-21 DIAGNOSIS — C541 Malignant neoplasm of endometrium: Secondary | ICD-10-CM | POA: Insufficient documentation

## 2021-11-21 MED ORDER — SODIUM CHLORIDE (PF) 0.9 % IJ SOLN
INTRAMUSCULAR | Status: AC
  Filled 2021-11-21: qty 50

## 2021-11-21 MED ORDER — IOHEXOL 300 MG/ML  SOLN
100.0000 mL | Freq: Once | INTRAMUSCULAR | Status: AC | PRN
  Administered 2021-11-21: 100 mL via INTRAVENOUS

## 2021-11-21 NOTE — Progress Notes (Addendum)
PCP - Charlott Rakes, MD [facility]  11-01-21  ?Cardiologist - Sherren Mocha, MD preop eval 11-23-21 epic  Stress test scheduled 28th/ 29th before surgery cleared ? ?PPM/ICD -  ?Device Orders -  ?Rep Notified -  ? ?Chest x-ray - 2023 epic ?EKG - 11-01-21 epic ?Stress Test -  ?ECHO - 2022 epic ?Cardiac Cath - 2021 ? ?Sleep Study -  ?CPAP -  ? ?Fasting Blood Sugar - 120 ?Checks Blood Sugar __4___ times a day ? ?Blood Thinner Instructions:Prasugrel Hold 7 days ?Aspirin Instructions:81 mg ? ?ERAS Protcol - ?PRE-SURGERY  ? ? ?COVID vaccine - x2 Moderna ? ?Activity--Able to complete ADL's with 2 liter of o2 with no additional SOB ? ?Anesthesia review: CAD/ stent, OSA, HTN, CHF, DM, STEMI 2021, Wear o2 2 liters continously managed by PCP who also manages Diabetes ? ?Patient denies shortness of breath, fever, cough and chest pain at PAT appointment ? ? ?All instructions explained to the patient, with a verbal understanding of the material. Patient agrees to go over the instructions while at home for a better understanding. Patient also instructed to self quarantine after being tested for COVID-19. The opportunity to ask questions was provided. ?  ?

## 2021-11-21 NOTE — Telephone Encounter (Signed)
Scheduled appt with Dr. Margarita Rana on 11/27/2021.  ?

## 2021-11-22 ENCOUNTER — Other Ambulatory Visit: Payer: Self-pay

## 2021-11-22 ENCOUNTER — Ambulatory Visit (HOSPITAL_BASED_OUTPATIENT_CLINIC_OR_DEPARTMENT_OTHER): Payer: Medicaid Other | Admitting: Family

## 2021-11-22 ENCOUNTER — Encounter (HOSPITAL_BASED_OUTPATIENT_CLINIC_OR_DEPARTMENT_OTHER): Payer: Self-pay | Admitting: Family

## 2021-11-22 DIAGNOSIS — I25118 Atherosclerotic heart disease of native coronary artery with other forms of angina pectoris: Secondary | ICD-10-CM | POA: Diagnosis not present

## 2021-11-22 DIAGNOSIS — I5032 Chronic diastolic (congestive) heart failure: Secondary | ICD-10-CM | POA: Diagnosis not present

## 2021-11-22 DIAGNOSIS — E118 Type 2 diabetes mellitus with unspecified complications: Secondary | ICD-10-CM

## 2021-11-22 DIAGNOSIS — R9431 Abnormal electrocardiogram [ECG] [EKG]: Secondary | ICD-10-CM

## 2021-11-22 DIAGNOSIS — G4733 Obstructive sleep apnea (adult) (pediatric): Secondary | ICD-10-CM

## 2021-11-22 DIAGNOSIS — E785 Hyperlipidemia, unspecified: Secondary | ICD-10-CM

## 2021-11-22 DIAGNOSIS — D509 Iron deficiency anemia, unspecified: Secondary | ICD-10-CM

## 2021-11-22 DIAGNOSIS — I10 Essential (primary) hypertension: Secondary | ICD-10-CM

## 2021-11-22 DIAGNOSIS — Z01810 Encounter for preprocedural cardiovascular examination: Secondary | ICD-10-CM | POA: Diagnosis not present

## 2021-11-22 DIAGNOSIS — Z794 Long term (current) use of insulin: Secondary | ICD-10-CM

## 2021-11-22 MED ORDER — ENTRESTO 24-26 MG PO TABS
1.0000 | ORAL_TABLET | Freq: Two times a day (BID) | ORAL | 3 refills | Status: DC
  Filled 2021-11-22: qty 90, 45d supply, fill #0

## 2021-11-22 MED ORDER — ENTRESTO 24-26 MG PO TABS
1.0000 | ORAL_TABLET | Freq: Two times a day (BID) | ORAL | 3 refills | Status: DC
Start: 2021-11-22 — End: 2022-07-16
  Filled 2021-11-22: qty 60, 30d supply, fill #0
  Filled 2021-12-21 – 2022-01-05 (×2): qty 180, 90d supply, fill #0
  Filled 2022-04-13: qty 180, 90d supply, fill #1

## 2021-11-22 NOTE — Progress Notes (Signed)
? ?Office Visit  ?  ?Patient Name: Brenda [name] ?Date of Encounter: 11/22/2021 ? ?PCP:  Charlott Rakes, MD ?  ?[place]  ?Cardiologist:  Sherren Mocha, MD  ?Advanced Practice Provider:  No care team member to display ?Electrophysiologist:  None  ? ?Chief Complaint  ?  ?Brenda [name] is a 61 y.o. female with a hx of obesity, OSA, DM2, PCOS, hypertension, coronary artery disease (2000 NSTEMI and complex bifurcation stenting of LAD and first diagonal, 10/2019 STEMI DES to LAD), diastolic heart failure  presents today for preop clearance ? ?Past Medical History  ?  ?Past Medical History:  ?Diagnosis Date  ? Arthritis   ? "back, hands, hips" (02/22/2015)  ? CAD (coronary artery disease)   ? a. complex LAD/diagonal bifurcation PCI in 2010. b. STEMI 10/2019 s/p PTCA/DES x1 to mLAD overlapping the old stent, residual disease treated medicaly.  ? CHF (congestive heart failure) ([place])   ? Depression   ? Diabetes mellitus ([place])   ? "started when I was pregnant; not sure if it was type 1 or type 2"   ? Hypercholesterolemia   ? Hypertension   ? Morbid obesity ([place])   ? Sleep apnea   ? "suppose to wear mask; I don't" (02/22/2015)  ? ?Past Surgical History:  ?Procedure Laterality Date  ? CARDIAC CATHETERIZATION  12/02/2012  ? CHOLECYSTECTOMY OPEN  09/04/1987  ? CORONARY ANGIOPLASTY WITH STENT PLACEMENT  09/03/2009  ? CORONARY/GRAFT ACUTE MI REVASCULARIZATION N/A 10/26/2019  ? Procedure: CORONARY/GRAFT ACUTE MI REVASCULARIZATION;  Surgeon: Burnell Blanks, MD;  Location: [facility];  Service: Cardiovascular;  Laterality: N/A;  ? LEFT HEART CATH AND CORONARY ANGIOGRAPHY N/A 10/26/2019  ? Procedure: LEFT HEART CATH AND CORONARY ANGIOGRAPHY;  Surgeon: Burnell Blanks, MD;  Location: [facility];  Service: Cardiovascular;  Laterality: N/A;  ? LEFT HEART CATHETERIZATION WITH CORONARY ANGIOGRAM N/A 12/25/2012  ? Procedure: LEFT HEART CATHETERIZATION WITH CORONARY  ANGIOGRAM;  Surgeon: Sherren Mocha, MD;  Location: [facility] LAB;  Service: Cardiovascular;  Laterality: N/A;  ? UMBILICAL HERNIA REPAIR  05/04/1989  ? doesn't think they used mesh  ? ? ?Allergies ? ?Allergies  ?Allergen Reactions  ? Hydrocodone Shortness Of Breath  ?  "I forget to breathe."   ? Clopidogrel Bisulfate Rash  ? ? ?History of Present Illness  ?  ?Brenda [name] is a 61 y.o. female with a hx of obesity, OSA, DM2, PCOS, hypertension, coronary artery disease (2000 NSTEMI and complex bifurcation stenting of LAD and first diagonal, 10/2019 STEMI DES to LAD), diastolic heart failure last seen 02/21/2021. ? ?Initial NSTEMI in 2000 underwent complex bifurcation stenting of LAD and first diagonal.  April 2014 underwent repeat heart catheterization due to exertional dyspnea and chest pain demonstrating severe ISR in diagonal branch and moderate ISR of LAD recommended for medical therapy due to procedural challenges due to body habitus and complexity of coronary anatomy.  February 2021 she had a repeat anterior STEMI and received PCI with DES to LAD (overlapping with old stent) and noted chronic ost Dx stenosis (jailed by stent) with echo at that time showing LVEF 60 to 65%, mild LVH, grade 1 diastolic dysfunction, normal RV function.  She was admitted June 2022 with 3 weeks of progressive dyspnea with repeat echo revealing LVEF 60 to 65%, moderately reduced RV function.  She was diuresed from 356 pounds to 338 pounds.  She was seen in follow-up by heart and vascular clinic 02/21/2021 still  noting dyspnea with simple tasks.  She was starting a new CPAP that evening.  Her lisinopril was transition to [employer]. She has not been seen since that time. ? ?She was admitted 10/18/2021 - 10/21/2021.  She was found to have acute on chronic anemia requiring 3 units PRBC.  She had exertional chest pain with at bedtime troponin 6 ? 5 and BNP 73.1 with CXR and EKG nonacute. Chest pain resolved first day. Prolonged QTc 520 on  admission. Her Entresto, Lasix, and Jardiance were held during admission due to hypotension but were able to be resumed. ? ?She presents today for follow up with her husband. Presents for clearance for  Robotic assisted total laparoscopic hysterectomy, bilateral salpingo-oophorectomy, sentinel lymph node biopsy, possible lymph node dissection, pass laparotomy, possible D+C with IUD placement due to stage I endometrial cancer.  Has been noted by Dr. Charisse March previous notes that the minimally invasive staging procedure will be difficult due to requiring Trendelenburg positioning as well asinsufflation. Her exertional dyspnea is stable compared to her previous remains on 2 L of O2.  Limited exercise tolerance getting short of breath when walking from room to room in her home.  No orthopnea, PND.  Did have an episode of recent edema that resolved with elevation.  She eats out and at home and we discussed tactics for eating low-sodium diet even when eating in a restaurant.  She has been checking blood pressure.  Actively at home and reports readings most often in the 130s.  Has had a difficult year she lost her father in November and her mother-in-law in February as well as recent events of cancer.  Offered my condolences. ? ?Recent BP at various clinic visits: ?11/17/21 159/66 ?11/01/21 111/72 ?10/03/21 132/82 ? ? ?EKGs/Labs/Other Studies Reviewed:  ? ?The following studies were reviewed today: ? ?Echo 02/08/21 ?1. Left ventricular ejection fraction, by estimation, is 60 to 65%. The  ?left ventricle has normal function. The left ventricle has no regional  ?wall motion abnormalities.  ? 2. Right ventricular systolic function is moderately reduced. The right  ?ventricular size is mildly enlarged.  ? 3. The mitral valve is normal in structure. No evidence of mitral valve  ?regurgitation. No evidence of mitral stenosis.  ? 4. The aortic valve is normal in structure. Aortic valve regurgitation is  ?not visualized. No aortic  stenosis is present.  ? 5. The inferior vena cava is normal in size with greater than 50%  ?respiratory variability, suggesting right atrial pressure of 3 mmHg.  ? ?Cardiac cath 10/2019 ? ?Prox Cx to Mid Cx lesion is 30% stenosed. ?Prox LAD to Mid LAD lesion is 10% stenosed. ?Mid LAD lesion is 100% stenosed. ?1st Diag-1 lesion is 80% stenosed. ?1st Diag-2 lesion is 70% stenosed. ?A drug-eluting stent was successfully placed using a SYNERGY XD [id number]. ?Post intervention, there is a 0% residual stenosis. ?  ?1. Acute anterior STEMI secondary to occlusion of the mid LAD ?2. Successful PTCA/DES x 1 mid LAD (overlapping with old stent) ?3. 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 There is TIMI-3 flow down the Diagonal branch.  ?4. The Circumflex is a small caliber vessel with mild non-obstructive plaque ?5. The RCA is a large dominant vessel with no obstructive disease ?  ?Recommendations: Will admit to the ICU. Continue Aggrastat infusion for 2 hours. Will continue DAPT with ASA and Effient for one year. Will start a high intensity statin. Will start Lopressor 25 mg po BID. Will need  to review home medications and decide the best way to add in an Ace-inh or ARB. She may do best with Coreg for better BP control. Echo in am. Pharmacy to assist with dosing of insulin. Aggressive risk factor modification.  ? Diagnostic ?Dominance: Right ?Intervention ? ? ? ?EKG:  EKG is  ordered today.  The ekg ordered today demonstrates NSR 98 bpm with left axis deviation, RBBB, known IVCD. No acute ST/T wave changes.  ? ?Recent Labs: ?10/04/2021: NT-Pro BNP 103 ?10/18/2021: B Natriuretic Peptide 73.1; TSH 2.827 ?10/19/2021: ALT 23; Magnesium 2.2 ?11/01/2021: Hemoglobin 12.2; Platelets 449 ?11/17/2021: BUN 14; Creatinine, Ser 0.88; Potassium 4.0; Sodium 138  ?Recent Lipid Panel ?   ?Component Value Date/Time  ? CHOL 194 10/04/2021 0839  ? TRIG 185 (H) 10/04/2021 0839  ? HDL 49 10/04/2021 0839  ? CHOLHDL 2.2  12/16/2019 1041  ? CHOLHDL 3.5 10/27/2019 0219  ? VLDL 21 10/27/2019 0219  ? LDLCALC 113 (H) 10/04/2021 7353  ? LDLDIRECT 135.9 02/22/2011 1132  ? ?Home Medications  ? ?Current Meds  ?Medication Sig  ? Acetaminophen-Caff-P

## 2021-11-22 NOTE — Patient Instructions (Addendum)
Medication Instructions:  ?Continue your current medications.  ? ?*If you need a refill on your cardiac medications before your next appointment, please call your pharmacy* ? ? ?Lab Work: ?None ordered today.  ? ?Testing/Procedures: ?Your EKG today was stable compared to previous. ? ?Your Physician has requested you have a Myocardial Perfusion Imaging Study.  ? ? ?Please arrive 15 minutes prior to your appointment time for registration and insurance purposes.  ? ?The test will take approximately 3 to 4 hours to complete; you may bring reading material. If someone comes with you to your appointment, they will need to remain in the main lobby due to limited testing space in the testing area. **If you are pregnant or breastfeeding, please notify the nuclear lab prior to your appointment**  ? ?How to prepare for your test:  ?- Do not eat or drink 3 hours prior to your test, except you may have water  ?- Do not consume products containing caffeine ( regular or decaf) 12 hours prior to your test ( coffee, chocolate, sodas or teas)  ?- Do bring a list of your current medications with you. If not listed below, you may take your medications as normal (Hold beta blocker- 24 hour prior for exercise myoview)   ?- Do wear comfortable clothes (no dresses or overalls) and walking shoes ( tennis shoes preferred), no heel or open toe shoes are allowed ?- Do not wear cologne, perfume, aftershave, or lotions ( deodorant is allowed)  ?- If these instructions are not followed, your test will be rescheduled  ? ?If you cannot keep your appointment, please provide 24 hours notice to the Nuclear Lab, to avoid a possible $50 charge to your account!  ? ? ? ?Follow-Up: ?At [facility], you and your health needs are our priority.  As part of our continuing mission to provide you with exceptional heart care, we have created designated Provider Care Teams.  These Care Teams include your primary Cardiologist (physician) and Advanced Practice  Providers (APPs -  Physician Assistants and Nurse Practitioners) who all work together to provide you with the care you need, when you need it. ? ?We recommend signing up for the patient portal called "MyChart".  Sign up information is provided on this After Visit Summary.  MyChart is used to connect with patients for Virtual Visits (Telemedicine).  Patients are able to view lab/test results, encounter notes, upcoming appointments, etc.  Non-urgent messages can be sent to your provider as well.   ?To learn more about what you can do with MyChart, go to [email or web address].   ? ?Your next appointment:   ?As scheduled in May with Richardson Dopp, PA to check in on blood pressure ? ? ?Other Instructions ?  ?Heart Healthy Diet Recommendations: ?A low-salt diet is recommended. Meats should be grilled, baked, or boiled. Avoid fried foods. Focus on lean protein sources like fish or chicken with vegetables and fruits. The American Heart Association is a Microbiologist!  American Heart Association Diet and Lifeystyle Recommendations   ? ?Exercise recommendations: ?The American Heart Association recommends 150 minutes of moderate intensity exercise weekly. ?Try 30 minutes of moderate intensity exercise 4-5 times per week. ?This could include walking, jogging, or swimming. ? ?Tips to Measure your Blood Pressure Correctly ? ?Here's what you can do to ensure a correct reading: ? Don't drink a caffeinated beverage or smoke during the 30 minutes before the test. ? Sit quietly for five minutes before the test begins. ? During the measurement, sit  in a chair with your feet on the floor and your arm supported so your elbow is at about heart level. ? The inflatable part of the cuff should completely cover at least 80% of your upper arm, and the cuff should be placed on bare skin, not over a shirt. ? Don't talk during the measurement. ? ?Blood pressure categories  ?Blood pressure category SYSTOLIC ?(upper number)  DIASTOLIC ?(lower  number)  ?Normal Less than 120 mm Hg and Less than 80 mm Hg  ?Elevated 120-129 mm Hg and Less than 80 mm Hg  ?High blood pressure: Stage 1 hypertension 130-139 mm Hg or 80-89 mm Hg  ?High blood pressure: Stage 2 hypertension 140 mm Hg or higher or 90 mm Hg or higher  ?Hypertensive crisis (consult your doctor immediately) Higher than 180 mm Hg and/or Higher than 120 mm Hg  ?Source: American Heart Association and American Stroke Association. ?For more on getting your blood pressure under control, buy Controlling Your Blood Pressure, a Special Health Report from [facility]. ? ? ?Blood Pressure Log ? ? ?Date ?  ?Time  ?Blood Pressure  ?Example: Nov 1 9 AM 124/78  ? ?    ? ?    ? ?    ? ?    ? ?    ? ?    ? ?    ? ?    ? ? ?  ?

## 2021-11-23 ENCOUNTER — Encounter (HOSPITAL_COMMUNITY)
Admission: RE | Admit: 2021-11-23 | Discharge: 2021-11-23 | Disposition: A | Discharge status: Home / Self Care | Payer: Medicaid Other | Source: Ambulatory Visit | Attending: Gynecologic Oncology | Admitting: Gynecologic Oncology

## 2021-11-23 ENCOUNTER — Telehealth: Payer: Self-pay

## 2021-11-23 ENCOUNTER — Other Ambulatory Visit: Payer: Self-pay

## 2021-11-23 ENCOUNTER — Encounter (HOSPITAL_COMMUNITY): Payer: Self-pay

## 2021-11-23 VITALS — HR 87 | Temp 98.5°F | Resp 18 | Ht 62.0 in | Wt 289.0 lb

## 2021-11-23 DIAGNOSIS — I5030 Unspecified diastolic (congestive) heart failure: Secondary | ICD-10-CM | POA: Diagnosis not present

## 2021-11-23 DIAGNOSIS — I110 Hypertensive heart disease with heart failure: Secondary | ICD-10-CM | POA: Diagnosis not present

## 2021-11-23 DIAGNOSIS — I2510 Atherosclerotic heart disease of native coronary artery without angina pectoris: Secondary | ICD-10-CM

## 2021-11-23 DIAGNOSIS — Z955 Presence of coronary angioplasty implant and graft: Secondary | ICD-10-CM | POA: Diagnosis not present

## 2021-11-23 DIAGNOSIS — I5032 Chronic diastolic (congestive) heart failure: Secondary | ICD-10-CM

## 2021-11-23 DIAGNOSIS — E118 Type 2 diabetes mellitus with unspecified complications: Secondary | ICD-10-CM

## 2021-11-23 DIAGNOSIS — Z794 Long term (current) use of insulin: Secondary | ICD-10-CM | POA: Insufficient documentation

## 2021-11-23 DIAGNOSIS — E119 Type 2 diabetes mellitus without complications: Secondary | ICD-10-CM | POA: Insufficient documentation

## 2021-11-23 DIAGNOSIS — C541 Malignant neoplasm of endometrium: Secondary | ICD-10-CM | POA: Diagnosis not present

## 2021-11-23 DIAGNOSIS — Z87891 Personal history of nicotine dependence: Secondary | ICD-10-CM | POA: Diagnosis not present

## 2021-11-23 DIAGNOSIS — G4730 Sleep apnea, unspecified: Secondary | ICD-10-CM | POA: Insufficient documentation

## 2021-11-23 DIAGNOSIS — I252 Old myocardial infarction: Secondary | ICD-10-CM | POA: Insufficient documentation

## 2021-11-23 DIAGNOSIS — E1169 Type 2 diabetes mellitus with other specified complication: Secondary | ICD-10-CM

## 2021-11-23 DIAGNOSIS — Z01812 Encounter for preprocedural laboratory examination: Secondary | ICD-10-CM | POA: Insufficient documentation

## 2021-11-23 HISTORY — DX: Myoneural disorder, unspecified: G70.9

## 2021-11-23 HISTORY — DX: Acute myocardial infarction, unspecified: I21.9

## 2021-11-23 HISTORY — DX: Malignant (primary) neoplasm, unspecified: C80.1

## 2021-11-23 HISTORY — DX: Anemia, unspecified: D64.9

## 2021-11-23 LAB — CBC
HCT: 37.4 % (ref 36.0–46.0)
Hemoglobin: 11.6 g/dL — ABNORMAL LOW (ref 12.0–15.0)
MCH: 26.2 pg (ref 26.0–34.0)
MCHC: 31 g/dL (ref 30.0–36.0)
MCV: 84.6 fL (ref 80.0–100.0)
Platelets: 397 10*3/uL (ref 150–400)
RBC: 4.42 MIL/uL (ref 3.87–5.11)
RDW: 16.8 % — ABNORMAL HIGH (ref 11.5–15.5)
WBC: 12.7 10*3/uL — ABNORMAL HIGH (ref 4.0–10.5)
nRBC: 0 % (ref 0.0–0.2)

## 2021-11-23 LAB — COMPREHENSIVE METABOLIC PANEL
ALT: 13 U/L (ref 0–44)
AST: 19 U/L (ref 15–41)
Albumin: 3.7 g/dL (ref 3.5–5.0)
Alkaline Phosphatase: 62 U/L (ref 38–126)
Anion gap: 8 (ref 5–15)
BUN: 20 mg/dL (ref 6–20)
CO2: 24 mmol/L (ref 22–32)
Calcium: 8.8 mg/dL — ABNORMAL LOW (ref 8.9–10.3)
Chloride: 101 mmol/L (ref 98–111)
Creatinine, Ser: 1.15 mg/dL — ABNORMAL HIGH (ref 0.44–1.00)
GFR, Estimated: 55 mL/min — ABNORMAL LOW (ref >60)
Glucose, Bld: 195 mg/dL — ABNORMAL HIGH (ref 70–99)
Potassium: 4.8 mmol/L (ref 3.5–5.1)
Sodium: 133 mmol/L — ABNORMAL LOW (ref 135–145)
Total Bilirubin: 0.3 mg/dL (ref 0.3–1.2)
Total Protein: 7.8 g/dL (ref 6.5–8.1)

## 2021-11-23 LAB — LIPID PANEL
Cholesterol: 111 mg/dL (ref 0–200)
HDL: 39 mg/dL — ABNORMAL LOW (ref >40)
LDL Cholesterol: 47 mg/dL (ref 0–99)
Total CHOL/HDL Ratio: 2.8 RATIO
Triglycerides: 126 mg/dL (ref <150)
VLDL: 25 mg/dL (ref 0–40)

## 2021-11-23 LAB — GLUCOSE, CAPILLARY: Glucose-Capillary: 204 mg/dL — ABNORMAL HIGH (ref 70–99)

## 2021-11-23 LAB — HEMOGLOBIN A1C
Hgb A1c MFr Bld: 7.3 % — ABNORMAL HIGH (ref 4.8–5.6)
Mean Plasma Glucose: 162.81 mg/dL

## 2021-11-23 NOTE — Telephone Encounter (Signed)
Attempted to contact patient to review CMP and CBC results. Unable to reach patient. Left message requesting return call.  ?

## 2021-11-23 NOTE — Progress Notes (Signed)
Lipid Panel added on to blood work per Laurann Montana, NP.  ?

## 2021-11-24 ENCOUNTER — Telehealth (HOSPITAL_COMMUNITY): Payer: Self-pay | Admitting: *Deleted

## 2021-11-24 NOTE — Telephone Encounter (Signed)
Called patient to review CBC and CMP results. Glucose (195) was a little high so encouraged patient to monitor glucose intake, increase fluid intake, and no extreme salt restrictions.  Informed patient we sent these results to her PCP and added on a lipid panel as well.  ? ?Patient got a CT scan on Tuesday and was wondering what the results were.  The results were read this morning, but Dr. Berline Lopes has not had a chance to look at results yet.  Spoke with Lenna Sciara, NP and from the CT scan results it stated no gross evidence of spread in abd/pelvis of metastatic disease. CT results said skin thickening with soft tissue stranding noted anterior lower abdominal wall. Reviewed with patient and patient stated "I have some lower stomach cramping, pain on the rt lower side, low level nausea and stomach feels queezy." Informed patient to call our office back if she has any concerns or questions.  ? ?Patient is scheduled for a stress test on 3/28 and 3/29 so we rescheduled her pre-op with Joylene John, NP to Monday 11/27/21 @ 1pm. ? ?Per Dr. Burt Knack-- okay to hold Prasugrel for atleast 7 days prior to procedure on April 5th. Informed patient her last dose would be on March 28th. She can continue low dose aspirin 81 mg until April 4th.  ? ?

## 2021-11-24 NOTE — Telephone Encounter (Signed)
Called patient back in regards to the skin thickening on the anterior lower abdominal wall that could be related to cellulitis from the CT scan.  Patient denies any redness, swelling, hot to the touch, or fever. Informed patient to call back with any new symptoms or concerns.  ?

## 2021-11-24 NOTE — Telephone Encounter (Signed)
Close encounter 

## 2021-11-27 ENCOUNTER — Telehealth: Payer: Self-pay

## 2021-11-27 ENCOUNTER — Encounter (HOSPITAL_BASED_OUTPATIENT_CLINIC_OR_DEPARTMENT_OTHER): Payer: Self-pay

## 2021-11-27 ENCOUNTER — Other Ambulatory Visit: Payer: Self-pay

## 2021-11-27 ENCOUNTER — Ambulatory Visit: Payer: Medicaid Other | Attending: Family Medicine | Admitting: Family Medicine

## 2021-11-27 ENCOUNTER — Encounter: Payer: Self-pay | Admitting: Family Medicine

## 2021-11-27 ENCOUNTER — Inpatient Hospital Stay (HOSPITAL_BASED_OUTPATIENT_CLINIC_OR_DEPARTMENT_OTHER): Payer: Medicaid Other | Admitting: Gynecologic Oncology

## 2021-11-27 VITALS — BP 122/69 | HR 96 | Temp 98.7°F | Resp 18 | Ht 62.0 in | Wt 275.0 lb

## 2021-11-27 VITALS — BP 129/79 | HR 88 | Ht 62.0 in | Wt 287.2 lb

## 2021-11-27 DIAGNOSIS — J9611 Chronic respiratory failure with hypoxia: Secondary | ICD-10-CM

## 2021-11-27 DIAGNOSIS — C541 Malignant neoplasm of endometrium: Secondary | ICD-10-CM

## 2021-11-27 DIAGNOSIS — E118 Type 2 diabetes mellitus with unspecified complications: Secondary | ICD-10-CM

## 2021-11-27 DIAGNOSIS — Z794 Long term (current) use of insulin: Secondary | ICD-10-CM | POA: Diagnosis not present

## 2021-11-27 LAB — POCT GLYCOSYLATED HEMOGLOBIN (HGB A1C): HbA1c, POC (controlled diabetic range): 7.6 % — AB (ref 0.0–7.0)

## 2021-11-27 MED ORDER — MISC. DEVICES MISC: 0 refills | Status: DC

## 2021-11-27 MED ORDER — MISC. DEVICES MISC: 0 refills | Status: AC

## 2021-11-27 MED ORDER — OZEMPIC (0.25 OR 0.5 MG/DOSE) 2 MG/1.5ML ~~LOC~~ SOPN
0.5000 mg | PEN_INJECTOR | SUBCUTANEOUS | 6 refills | Status: DC
  Filled 2021-11-27 – 2021-12-07 (×2): qty 1.5, 28d supply, fill #0
  Filled 2022-01-08: qty 1.5, 28d supply, fill #1
  Filled 2022-03-11: qty 1.5, 28d supply, fill #2

## 2021-11-27 NOTE — Progress Notes (Signed)
Anesthesia Chart Review ? ? Case: [id number] Date/Time: 12/06/21 1145  ? Procedures:  ?    XI ROBOTIC ASSISTED TOTAL HYSTERECTOMY WITH BILATERAL SALPINGO OOPHORECTOMY ?    POSSIBLE SENTINEL NODE BIOPSY ?    POSSIBLE LAPAROTOMY ?    POSSIBLE DILATATION AND CURETTAGE ?    POSSIBLE INTRAUTERINE DEVICE (IUD) INSERTION MIRENA  ? Anesthesia type: General  ? Pre-op diagnosis: ENDOMETRIAL CANCER  ? Location: [facility] ROOM 05 / [facility]  ? Surgeons: Lafonda Mosses, MD  ? ?  ? ? ?DISCUSSION:60 y.o. former smoker with h/o HTN, DM II, CHF, CAD (2000 NSTEMI and complex bifurcation stenting of LAD and first diagonal, 10/2019 STEMI DES to LAD), diastolic heart failure, sleep apnea, endometrial cancer scheduled for above procedure 12/06/2021 with Dr. Jeral Pinch.  ? ?Pt last seen by cardiology 11/22/21. Per OV note, "Preop -pending upcoming of the surgery due to stage I uterine cancer. urgery anticipated to be difficult due to need for trendelenburg positioning and insufflation.  Upcoming follow-up 11/28/2021 with [facility] at [facility] office. According to the Revised Cardiac Risk Index (RCRI), her Perioperative Risk of Major Cardiac Event is (%): 11. Her Functional Capacity in METs is: 3.63 according to the Duke Activity Status Index (DASI). Plan for lexiscan myoview due to poor exercise tolerance, exertional dyspnea, and complex CAD surgery prior to surgery. This has been scheduled for 11/28/21 and 11/29/21 as required 2 day study due to BMI. Per Dr. Burt Knack okay to hold Prasugrel for at least 7 days prior to procedure. It would be best to stay on low dose Aspirin but could be held if absolutely necessary. Will route to surgical team so they are aware. " ? ? ?VS: Pulse 87   Temp 36.9 ?C (Oral)   Resp 18   Ht '5\' 2"'$  (1.575 m)   Wt 131.1 kg   LMP 10/30/2014 (Approximate)   SpO2 100% Comment: 2 L. O2  BMI 52.86 kg/m?  ? ?PROVIDERS: ?Charlott Rakes, MD is PCP  ? ? ?LABS: Labs reviewed: Acceptable for surgery. ?(all labs ordered are listed, but  only abnormal results are displayed) ? ?Labs Reviewed  ?CBC - Abnormal; Notable for the following components:  ?    Result Value  ? WBC 12.7 (*)   ? Hemoglobin 11.6 (*)   ? RDW 16.8 (*)   ? All other components within normal limits  ?COMPREHENSIVE METABOLIC PANEL - Abnormal; Notable for the following components:  ? Sodium 133 (*)   ? Glucose, Bld 195 (*)   ? Creatinine, Ser 1.15 (*)   ? Calcium 8.8 (*)   ? GFR, Estimated 55 (*)   ? All other components within normal limits  ?HEMOGLOBIN A1C - Abnormal; Notable for the following components:  ? Hgb A1c MFr Bld 7.3 (*)   ? All other components within normal limits  ?GLUCOSE, CAPILLARY - Abnormal; Notable for the following components:  ? Glucose-Capillary 204 (*)   ? All other components within normal limits  ?LIPID PANEL - Abnormal; Notable for the following components:  ? HDL 39 (*)   ? All other components within normal limits  ? ? ? ?IMAGES: ? ? ?EKG: ?11/22/21 ?Rate 91 bpm NSR  ?RBBB ?LA deviation  ?IVCD ? ?CV: ?Echo 02/08/2021 ?1. Left ventricular ejection fraction, by estimation, is 60 to 65%. The  ?left ventricle has normal function. The left ventricle has no regional  ?wall motion abnormalities.  ? 2. Right ventricular systolic function is moderately reduced. The right  ?ventricular size  is mildly enlarged.  ? 3. The mitral valve is normal in structure. No evidence of mitral valve  ?regurgitation. No evidence of mitral stenosis.  ? 4. The aortic valve is normal in structure. Aortic valve regurgitation is  ?not visualized. No aortic stenosis is present.  ? 5. The inferior vena cava is normal in size with greater than 50%  ?respiratory variability, suggesting right atrial pressure of 3 mmHg.  ?Past Medical History:  ?Diagnosis Date  ? Anemia   ? Arthritis   ? "back, hands, hips" (02/22/2015)  ? CAD (coronary artery disease)   ? a. complex LAD/diagonal bifurcation PCI in 2010. b. STEMI 10/2019 s/p PTCA/DES x1 to mLAD overlapping the old stent, residual disease treated  medicaly.  ? Cancer [facility])   ? uterine  ? CHF (congestive heart failure) ([place])   ? Depression   ? Diabetes mellitus ([place])   ? "started when I was pregnant; not sure if it was type 1 or type 2"   ? Hypercholesterolemia   ? Hypertension   ? Morbid obesity ([place])   ? Myocardial infarction [facility])   ? mild x 3  ? Neuromuscular disorder ([place])   ? neuropathy feet  ? Sleep apnea   ? cpap/  ? ? ?Past Surgical History:  ?Procedure Laterality Date  ? CARDIAC CATHETERIZATION  12/02/2012  ? CHOLECYSTECTOMY OPEN  09/04/1987  ? CORONARY ANGIOPLASTY WITH STENT PLACEMENT  09/03/2009  ? CORONARY/GRAFT ACUTE MI REVASCULARIZATION N/A 10/26/2019  ? Procedure: CORONARY/GRAFT ACUTE MI REVASCULARIZATION;  Surgeon: Burnell Blanks, MD;  Location: [facility];  Service: Cardiovascular;  Laterality: N/A;  ? LEFT HEART CATH AND CORONARY ANGIOGRAPHY N/A 10/26/2019  ? Procedure: LEFT HEART CATH AND CORONARY ANGIOGRAPHY;  Surgeon: Burnell Blanks, MD;  Location: [facility];  Service: Cardiovascular;  Laterality: N/A;  ? LEFT HEART CATHETERIZATION WITH CORONARY ANGIOGRAM N/A 12/25/2012  ? Procedure: LEFT HEART CATHETERIZATION WITH CORONARY ANGIOGRAM;  Surgeon: Sherren Mocha, MD;  [facility] C[facility];  Service: Cardiovascular;  Laterality: N/A;  ? UMBILICAL HERNIA REPAIR  05/04/1989  ? doesn't think they used mesh  ? ? ?MEDICATIONS: ? Acetaminophen-Caff-Pyrilamine (MIDOL COMPLETE) 500-60-15 MG TABS  ? aspirin 81 MG EC tablet  ? atorvastatin (LIPITOR) 80 MG tablet  ? Blood Glucose Monitoring Suppl (TRUE METRIX METER) DEVI  ? Dulaglutide (TRULICITY) 1.5 TD/1.7OH SOPN  ? empagliflozin (JARDIANCE) 10 MG TABS tablet  ? FLUoxetine (PROZAC) 20 MG capsule  ? furosemide (LASIX) 40 MG tablet  ? gabapentin (NEURONTIN) 300 MG capsule  ? glucose blood (TRUE METRIX BLOOD GLUCOSE TEST) test strip  ? insulin detemir (LEVEMIR FLEXTOUCH) 100 UNIT/ML FlexPen  ? Insulin Pen Needle (NOVOFINE) 30G X 8 MM MISC  ? Insulin Syringe-Needle  U-100 30G X 1/2" 0.3 ML MISC  ? megestrol (MEGACE) 40 MG tablet  ? Multiple Minerals (CALCIUM-MAGNESIUM-ZINC) TABS  ? nitroGLYCERIN (NITROSTAT) 0.4 MG SL tablet  ? prasugrel (EFFIENT) 5 MG TABS tablet  ? sacubitril-valsartan (ENTRESTO) 24-26 MG  ? TRUEPLUS LANCETS 30G MISC  ? vitamin B-12 (CYANOCOBALAMIN) 1000 MCG tablet  ? ?No current facility-administered medications for this encounter.  ? ? ? ?Konrad Felix Ward, PA-C ?[facility] ?[phone] ? ? ? ? ? ?

## 2021-11-27 NOTE — Patient Instructions (Signed)
Preparing for your Surgery ? ?Plan for surgery on December 06, 2021 with Dr. Jeral Pinch at [place] will be scheduled for robotic assisted total laparoscopic hysterectomy (removal of the uterus and cervix), bilateral salpingo-oophorectomy (removal of both ovaries and fallopian tubes), possible sentinel lymph node biopsy, possible laparotomy (larger incision on your abdomen if needed), possible dilation and curettage of the uterus with Mirena IUD placement if unable to tolerate trendelenburg positioning for robotic surgery.  ? ?Proceed with cardiac testing as scheduled. We are awaiting clearance/guidance to proceed with surgery from a cardiac standpoint. ? ?Pre-operative Testing ?-(DONE) You will receive a phone call from presurgical testing at [facility] to arrange for a pre-operative appointment and lab work. ? ?-Bring your insurance card, copy of an advanced directive if applicable, medication list ? ?-At that visit, you will be asked to sign a consent for a possible blood transfusion in case a transfusion becomes necessary during surgery.  The need for a blood transfusion is rare but having consent is a necessary part of your care.    ? ?-You need to stop taking PRASUGREL AT LEAST 7 DAYS BEFORE SURGERY. You can continue taking your baby aspirin, 81 mg, with your last dose being the [place] surgery. ? ?-Do not take supplements such as fish oil (omega 3), red yeast rice, turmeric before your surgery. You want to avoid medications with aspirin in them including headache powders such as BC or Goody's), Excedrin migraine. ? ?Day Before Surgery at Home ?-You will be asked to take in a light diet the day before surgery. You will be advised you can have clear liquids up until 3 hours before your surgery.   ? ?Eat a light diet the day before surgery.  Examples including soups, broths, toast, yogurt, mashed potatoes.  AVOID GAS PRODUCING FOODS. Things to avoid include carbonated beverages  (fizzy beverages, sodas), raw fruits and raw vegetables (uncooked), or beans.  ? ?If your bowels are filled with gas, your surgeon will have difficulty visualizing your pelvic organs which increases your surgical risks. ? ?Your role in recovery ?Your role is to become active as soon as directed by your doctor, while still giving yourself time to heal.  Rest when you feel tired. You will be asked to do the following in order to speed your recovery: ? ?- Cough and breathe deeply. This helps to clear and expand your lungs and can prevent pneumonia after surgery.  ?- STAY ACTIVE WHEN YOU GET HOME. Do mild physical activity. Walking or moving your legs help your circulation and body functions return to normal. Do not try to get up or walk alone the first time after surgery.   ?-If you develop swelling on one leg or the other, pain in the back of your leg, redness/warmth in one of your legs, please call the office or go to the Emergency Room to have a doppler to rule out a blood clot. For shortness of breath, chest pain-seek care in the Emergency Room as soon as possible. ?- Actively manage your pain. Managing your pain lets you move in comfort. We will ask you to rate your pain on a scale of zero to 10. It is your responsibility to tell your doctor or nurse where and how much you hurt so your pain can be treated. ? ?Special Considerations ?-If you are diabetic, you may be placed on insulin after surgery to have closer control over your blood sugars to promote healing and recovery.  This does not mean that you will be discharged on insulin.  If applicable, your oral antidiabetics will be resumed when you are tolerating a solid diet. ? ?-Your final pathology results from surgery should be available around one week after surgery and the results will be relayed to you when available. ? ?-FMLA forms can be faxed to [phone] and please allow 5-7 business days for completion. ? ?Pain Management After Surgery ?-Make sure  that you have Tylenol at home to use on a regular basis after surgery for pain control. IF YOU HAVE ROBOTIC SURGERY, YOU WILL BE PRESCRIBED PAIN MEDICATION TO USE AS NEEDED FOR MODERATE PAIN. ? ?-Review the attached handout on narcotic use and their risks and side effects.  ? ?Bowel Regimen ?-You may be prescribed Sennakot-S to take nightly to prevent constipation especially if you are taking the narcotic pain medication intermittently IF YOU HAVE A HYSTERECTOMY.  It is important to prevent constipation and drink adequate amounts of liquids. You can stop taking this medication when you are not taking pain medication and you are back on your normal bowel routine. ? ?Risks of Surgery ?Risks of surgery are low but include bleeding, infection, damage to surrounding structures, re-operation, blood clots, and very rarely death. ? ? ?Blood Transfusion Information (For the consent to be signed before surgery) ? ?We will be checking your blood type before surgery so in case of emergencies, we will know what type of blood you would need. ? ?                                          WHAT IS A BLOOD TRANSFUSION? ? ?A transfusion is the replacement of blood or some of its parts. Blood is made up of multiple cells which provide different functions. ?Red blood cells carry oxygen and are used for blood loss replacement. ?White blood cells fight against infection. ?Platelets control bleeding. ?Plasma helps clot blood. ?Other blood products are available for specialized needs, such as hemophilia or other clotting disorders. ?BEFORE THE TRANSFUSION  ?Who gives blood for transfusions?  ?You may be able to donate blood to be used at a later date on yourself (autologous donation). ?Relatives can be asked to donate blood. This is generally not any safer than if you have received blood from a stranger. The same precautions are taken to ensure safety when a relative's blood is donated. ?Healthy volunteers who are fully evaluated to make  sure their blood is safe. This is blood bank blood. ?Transfusion therapy is the safest it has ever been in the practice of medicine. Before blood is taken from a donor, a complete history is taken to make sure that person has no history of diseases nor engages in risky social behavior (examples are intravenous drug use or sexual activity with multiple partners). The donor's travel history is screened to minimize risk of transmitting infections, such as malaria. The donated blood is tested for signs of infectious diseases, such as HIV and hepatitis. The blood is then tested to be sure it is compatible with you in order to minimize the chance of a transfusion reaction. If you or a relative donates blood, this is often done in anticipation of surgery and is not appropriate for emergency situations. It takes many days to process the donated blood. ?RISKS AND COMPLICATIONS ?Although transfusion therapy is very safe and saves many lives, the main dangers  of transfusion include:  ?Getting an infectious disease. ?Developing a transfusion reaction. This is an allergic reaction to something in the blood you were given. Every precaution is taken to prevent this. ?The decision to have a blood transfusion has been considered carefully by your caregiver before blood is given. Blood is not given unless the benefits outweigh the risks. ? ?AFTER SURGERY INSTRUCTIONS ? ?Return to work: 4-6 weeks if applicable ? ?Activity: ?1. Be up and out of the bed during the day.  Take a nap if needed.  You may walk up steps but be careful and use the hand rail.  Stair climbing will tire you more than you think, you may need to stop part way and rest.  ? ?2. No lifting or straining for 6 weeks over 10 pounds. No pushing, pulling, straining for 6 weeks. ? ?3. No driving for around 1 week(s) if you have a hysterectomy. If a D&C with IUD is performed, no driving for 24 hours after anesthesia if you were cleared to drive before surgery.  Do not drive  if you are taking narcotic pain medicine and make sure that your reaction time has returned.  ? ?4. You can shower as soon as the next day after surgery. Shower daily.  Use your regular soap and water (not di

## 2021-11-27 NOTE — Progress Notes (Signed)
? ?Subjective:  ?Patient ID: Brenda [name], female    DOB: [birth year]  Age: 61 y.o. MRN: [id number] ? ?CC: No chief complaint on file. ? ? ?HPI ?Brenda [name] is a 61 y.o. year old female with a history of type 2 diabetes mellitus (A1c 7.6), obstructive sleep apnea (on CPAP), hypertension, CAD status post multiple stent, STEMI in 10/2019,  HFpEF (EF 60-65% from echo of 02/2021), chronic respiratory failure on 2L oxygen, new diagnosis of stage I Endometrial Carcinoma after persistent abnormal uterine bleeding. ? ?Interval History: ?She presents today to discuss initiation of Ozempic as recommended by her cardiologist and GYN.  Of note she is currently on Trulicity 1.5 mg weekly and her A1c is down to 7.6. ?She is currently undergoing cardiac work-up to determine cardiac clearance for her anticipated TAH/BSO by GYN scheduled for early next month.  Seen by cardiology 1 week ago and Lexiscan Myoview test ordered. ?Also wondering if she can receive the portable oxygen concentrator as she has to decrease her oxygen from 2 L to 1.5 L when she is out and about to prevent running out of oxygen. ?Accompanied by her husband today. ?Past Medical History:  ?Diagnosis Date  ? Anemia   ? Arthritis   ? "back, hands, hips" (02/22/2015)  ? CAD (coronary artery disease)   ? a. complex LAD/diagonal bifurcation PCI in 2010. b. STEMI 10/2019 s/p PTCA/DES x1 to mLAD overlapping the old stent, residual disease treated medicaly.  ? Cancer [facility])   ? uterine  ? CHF (congestive heart failure) ([place])   ? Depression   ? Diabetes mellitus ([place])   ? "started when I was pregnant; not sure if it was type 1 or type 2"   ? Hypercholesterolemia   ? Hypertension   ? Morbid obesity ([place])   ? Myocardial infarction [facility])   ? mild x 3  ? Neuromuscular disorder ([place])   ? neuropathy feet  ? Sleep apnea   ? cpap/  ? ? ?Past Surgical History:  ?Procedure Laterality Date  ? CARDIAC CATHETERIZATION  12/02/2012  ? CHOLECYSTECTOMY OPEN  09/04/1987  ? CORONARY  ANGIOPLASTY WITH STENT PLACEMENT  09/03/2009  ? CORONARY/GRAFT ACUTE MI REVASCULARIZATION N/A 10/26/2019  ? Procedure: CORONARY/GRAFT ACUTE MI REVASCULARIZATION;  Surgeon: Burnell Blanks, MD;  Location: [place] CV LAB;  Service: Cardiovascular;  Laterality: N/A;  ? LEFT HEART CATH AND CORONARY ANGIOGRAPHY N/A 10/26/2019  ? Procedure: LEFT HEART CATH AND CORONARY ANGIOGRAPHY;  Surgeon: Burnell Blanks, MD;  Location: [place] CV LAB;  Service: Cardiovascular;  Laterality: N/A;  ? LEFT HEART CATHETERIZATION WITH CORONARY ANGIOGRAM N/A 12/25/2012  ? Procedure: LEFT HEART CATHETERIZATION WITH CORONARY ANGIOGRAM;  Surgeon: Sherren Mocha, MD;  Location: [facility] CATH LAB;  Service: Cardiovascular;  Laterality: N/A;  ? UMBILICAL HERNIA REPAIR  05/04/1989  ? doesn't think they used mesh  ? ? ?Family History  ?Problem Relation Age of Onset  ? Coronary artery disease Mother   ?     in her mid 61s  ? Hypertension Mother   ? Cancer Mother   ?     skin  ? Heart attack Father   ?     in his mid 72s  ? COPD Father   ? Stroke Sister   ? Coronary artery disease Sister   ? Diabetes Sister   ? Other Half-Sister   ? Prostate cancer Neg Hx   ? Colon cancer Neg Hx   ? Breast cancer Neg Hx   ?  Endometrial cancer Neg Hx   ? Ovarian cancer Neg Hx   ? Pancreatic cancer Neg Hx   ? ? ?Social History  ? ?Socioeconomic History  ? Marital status: Married  ?  Spouse name: Not on file  ? Number of children: 2  ? Years of education: Not on file  ? Highest education level: Not on file  ?Occupational History  ? Occupation: Housewife  ?Tobacco Use  ? Smoking status: Former  ?  Packs/day: 0.50  ?  Years: 1.00  ?  Pack years: 0.50  ?  Types: Cigarettes, Cigars  ?  Quit date: 09/03/1986  ?  Years since quitting: 35.2  ? Smokeless tobacco: Never  ?Vaping Use  ? Vaping Use: Never used  ?Substance and Sexual Activity  ? Alcohol use: No  ? Drug use: No  ? Sexual activity: Not Currently  ?Other Topics Concern  ? Not on file  ?Social History  Narrative  ? Not on file  ? ?Social Determinants of Health  ? ?Financial Resource Strain: High Risk  ? Difficulty of Paying Living Expenses: Hard  ?Food Insecurity: Food Insecurity Present  ? Worried About Charity fundraiser in the Last Year: Sometimes true  ? Ran Out of Food in the Last Year: Sometimes true  ?Transportation Needs: No Transportation Needs  ? Lack of Transportation (Medical): No  ? Lack of Transportation (Non-Medical): No  ?Physical Activity: Not on file  ?Stress: Not on file  ?Social Connections: Not on file  ? ? ?Allergies  ?Allergen Reactions  ? Hydrocodone Shortness Of Breath  ?  "I forget to breathe."   ? Clopidogrel Bisulfate Rash  ? ? ?Outpatient Medications Prior to Visit  ?Medication Sig Dispense Refill  ? Acetaminophen-Caff-Pyrilamine (MIDOL COMPLETE) 500-60-15 MG TABS Take 2 tablets by mouth daily as needed (pain/cramps).    ? aspirin 81 MG EC tablet Take 1 tablet (81 mg total) by mouth daily. 30 tablet 11  ? atorvastatin (LIPITOR) 80 MG tablet Take 1 tablet (80 mg total) by mouth every evening. 30 tablet 6  ? Blood Glucose Monitoring Suppl (TRUE METRIX METER) DEVI 1 each by Does not apply route 3 (three) times daily. 1 Device 0  ? empagliflozin (JARDIANCE) 10 MG TABS tablet Take 1 tablet (10 mg total) by mouth daily. 30 tablet 6  ? FLUoxetine (PROZAC) 20 MG capsule Take 1 capsule (20 mg total) by mouth daily. 30 capsule 6  ? furosemide (LASIX) 40 MG tablet Take 1 tablet (40 mg total) by mouth daily. 30 tablet 6  ? gabapentin (NEURONTIN) 300 MG capsule Take 2 capsules (600 mg total) by mouth at bedtime. 60 capsule 6  ? glucose blood (TRUE METRIX BLOOD GLUCOSE TEST) test strip Use as instructed (Patient taking differently: 3 (three) times daily. Use as instructed) 100 each 5  ? insulin detemir (LEVEMIR FLEXTOUCH) 100 UNIT/ML FlexPen Inject 22 Units into the skin 2 (two) times daily. (Patient taking differently: Inject 25 Units into the skin 2 (two) times daily.) 30 mL 6  ? Insulin Pen  Needle (NOVOFINE) 30G X 8 MM MISC Use as directed with Novolin 70/30 insulin (Patient taking differently: Use as directed with Novolin 70/30 insulin) 100 each 1  ? megestrol (MEGACE) 40 MG tablet Take 1 tablet (40 mg total) by mouth 2 (two) times daily. 42 tablet 3  ? Multiple Minerals (CALCIUM-MAGNESIUM-ZINC) TABS Take 1 tablet by mouth daily.    ? nitroGLYCERIN (NITROSTAT) 0.4 MG SL tablet Place 1 tablet (0.4 mg total)  under the tongue every 5 (five) minutes as needed for chest pain (up to 3 doses. If taking 3rd dose call 911). 25 tablet 3  ? prasugrel (EFFIENT) 5 MG TABS tablet Take 1 tablet (5 mg total) by mouth daily. 30 tablet 11  ? sacubitril-valsartan (ENTRESTO) 24-26 MG Take 1 tablet by mouth 2 (two) times daily. 180 tablet 3  ? TRUEPLUS LANCETS 30G MISC 1 each by Does not apply route 3 (three) times daily. 100 each 5  ? vitamin B-12 (CYANOCOBALAMIN) 1000 MCG tablet Take 1,000 mcg by mouth daily.    ? Dulaglutide (TRULICITY) 1.5 UJ/8.1XB SOPN Inject 1.5 mg into the skin once a week. 2 mL 6  ? Insulin Syringe-Needle U-100 30G X 1/2" 0.3 ML MISC use as directed 5 (five) times daily with insulin 150 each 2  ? ?No facility-administered medications prior to visit.  ? ? ? ?ROS ?Review of Systems  ?Constitutional:  Negative for activity change, appetite change and fatigue.  ?HENT:  Negative for congestion, sinus pressure and sore throat.   ?Eyes:  Negative for visual disturbance.  ?Respiratory:  Positive for shortness of breath. Negative for cough, chest tightness and wheezing.   ?Cardiovascular:  Negative for chest pain and palpitations.  ?Gastrointestinal:  Negative for abdominal distention, abdominal pain and constipation.  ?Endocrine: Negative for polydipsia.  ?Genitourinary:  Positive for menstrual problem. Negative for dysuria and frequency.  ?Musculoskeletal:  Negative for arthralgias and back pain.  ?Skin:  Negative for rash.  ?Neurological:  Negative for tremors, light-headedness and numbness.   ?Hematological:  Does not bruise/bleed easily.  ?Psychiatric/Behavioral:  Negative for agitation and behavioral problems.   ? ?Objective:  ?BP 129/79   Pulse 88   Ht '5\' 2"'$  (1.575 m)   Wt 287 lb 3.2 oz (130.3 kg)   L

## 2021-11-27 NOTE — Patient Instructions (Signed)
Semaglutide Injection ?What is this medication? ?SEMAGLUTIDE (SEM a GLOO tide) treats type 2 diabetes. It works by increasing insulin levels in your body, which decreases your blood sugar (glucose). It also reduces the amount of sugar released into the blood and slows down your digestion. It can also be used to lower the risk of heart attack and stroke in people with type 2 diabetes. Changes to diet and exercise are often combined with this medication. ?This medicine may be used for other purposes; ask your health care provider or pharmacist if you have questions. ?COMMON BRAND NAME(S): OZEMPIC ?What should I tell my care team before I take this medication? ?They need to know if you have any of these conditions: ?Endocrine tumors (MEN 2) or if someone in your family had these tumors ?Eye disease, vision problems ?History of pancreatitis ?Kidney disease ?Stomach problems ?Thyroid cancer or if someone in your family had thyroid cancer ?An unusual or allergic reaction to semaglutide, other medications, foods, dyes, or preservatives ?Pregnant or trying to get pregnant ?Breast-feeding ?How should I use this medication? ?This medication is for injection under the skin of your upper leg (thigh), stomach area, or upper arm. It is given once every week (every 7 days). You will be taught how to prepare and give this medication. Use exactly as directed. Take your medication at regular intervals. Do not take it more often than directed. ?If you use this medication with insulin, you should inject this medication and the insulin separately. Do not mix them together. Do not give the injections right next to each other. Change (rotate) injection sites with each injection. ?It is important that you put your used needles and syringes in a special sharps container. Do not put them in a trash can. If you do not have a sharps container, call your pharmacist or care team to get one. ?A special MedGuide will be given to you by the  pharmacist with each prescription and refill. Be sure to read this information carefully each time. ?This medication comes with INSTRUCTIONS FOR USE. Ask your pharmacist for directions on how to use this medication. Read the information carefully. Talk to your pharmacist or care team if you have questions. ?Talk to your care team about the use of this medication in children. Special care may be needed. ?Overdosage: If you think you have taken too much of this medicine contact a poison control center or emergency room at once. ?NOTE: This medicine is only for you. Do not share this medicine with others. ?What if I miss a dose? ?If you miss a dose, take it as soon as you can within 5 days after the missed dose. Then take your next dose at your regular weekly time. If it has been longer than 5 days after the missed dose, do not take the missed dose. Take the next dose at your regular time. Do not take double or extra doses. If you have questions about a missed dose, contact your care team for advice. ?What may interact with this medication? ?Other medications for diabetes ?Many medications may cause changes in blood sugar, these include: ?Alcohol containing beverages ?Antiviral medications for HIV or AIDS ?Aspirin and aspirin-like medications ?Certain medications for blood pressure, heart disease, irregular heart beat ?Chromium ?Diuretics ?Female hormones, such as estrogens or progestins, birth control pills ?Fenofibrate ?Gemfibrozil ?Isoniazid ?Lanreotide ?Female hormones or anabolic steroids ?MAOIs like Carbex, Eldepryl, Marplan, Nardil, and Parnate ?Medications for weight loss ?Medications for allergies, asthma, cold, or cough ?Medications for depression,   anxiety, or psychotic disturbances ?Niacin ?Nicotine ?NSAIDs, medications for pain and inflammation, like ibuprofen or naproxen ?Octreotide ?Pasireotide ?Pentamidine ?Phenytoin ?Probenecid ?Quinolone antibiotics such as ciprofloxacin, levofloxacin, ofloxacin ?Some  herbal dietary supplements ?Steroid medications such as prednisone or cortisone ?Sulfamethoxazole; trimethoprim ?Thyroid hormones ?Some medications can hide the warning symptoms of low blood sugar (hypoglycemia). You may need to monitor your blood sugar more closely if you are taking one of these medications. These include: ?Beta-blockers, often used for high blood pressure or heart problems (examples include atenolol, metoprolol, propranolol) ?Clonidine ?Guanethidine ?Reserpine ?This list may not describe all possible interactions. Give your health care provider a list of all the medicines, herbs, non-prescription drugs, or dietary supplements you use. Also tell them if you smoke, drink alcohol, or use illegal drugs. Some items may interact with your medicine. ?What should I watch for while using this medication? ?Visit your care team for regular checks on your progress. ?Drink plenty of fluids while taking this medication. Check with your care team if you get an attack of severe diarrhea, nausea, and vomiting. The loss of too much body fluid can make it dangerous for you to take this medication. ?A test called the HbA1C (A1C) will be monitored. This is a simple blood test. It measures your blood sugar control over the last 2 to 3 months. You will receive this test every 3 to 6 months. ?Learn how to check your blood sugar. Learn the symptoms of low and high blood sugar and how to manage them. ?Always carry a quick-source of sugar with you in case you have symptoms of low blood sugar. Examples include hard sugar candy or glucose tablets. Make sure others know that you can choke if you eat or drink when you develop serious symptoms of low blood sugar, such as seizures or unconsciousness. They must get medical help at once. ?Tell your care team if you have high blood sugar. You might need to change the dose of your medication. If you are sick or exercising more than usual, you might need to change the dose of your  medication. ?Do not skip meals. Ask your care team if you should avoid alcohol. Many nonprescription cough and cold products contain sugar or alcohol. These can affect blood sugar. ?Pens should never be shared. Even if the needle is changed, sharing may result in passing of viruses like hepatitis or HIV. ?Wear a medical ID bracelet or chain, and carry a card that describes your disease and details of your medication and dosage times. ?Do not become pregnant while taking this medication. Women should inform their care team if they wish to become pregnant or think they might be pregnant. There is a potential for serious side effects to an unborn child. Talk to your care team for more information. ?What side effects may I notice from receiving this medication? ?Side effects that you should report to your care team as soon as possible: ?Allergic reactions--skin rash, itching, hives, swelling of the face, lips, tongue, or throat ?Change in vision ?Dehydration--increased thirst, dry mouth, feeling faint or lightheaded, headache, dark yellow or brown urine ?Gallbladder problems--severe stomach pain, nausea, vomiting, fever ?Heart palpitations--rapid, pounding, or irregular heartbeat ?Kidney injury--decrease in the amount of urine, swelling of the ankles, hands, or feet ?Pancreatitis--severe stomach pain that spreads to your back or gets worse after eating or when touched, fever, nausea, vomiting ?Thyroid cancer--new mass or lump in the neck, pain or trouble swallowing, trouble breathing, hoarseness ?Side effects that usually do not require medical   attention (report to your care team if they continue or are bothersome): ?Diarrhea ?Loss of appetite ?Nausea ?Stomach pain ?Vomiting ?This list may not describe all possible side effects. Call your doctor for medical advice about side effects. You may report side effects to FDA at 1-800-FDA-1088. ?Where should I keep my medication? ?Keep out of the reach of children. ?Store  unopened pens in a refrigerator between 2 and 8 degrees C (36 and 46 degrees F). Do not freeze. Protect from light and heat. After you first use the pen, it can be stored for 56 days at room temperature between 15 and

## 2021-11-27 NOTE — Telephone Encounter (Signed)
Telephoned patient, she will call back after procedure and schedule with [facility]. ?

## 2021-11-28 ENCOUNTER — Ambulatory Visit (HOSPITAL_COMMUNITY)
Admission: RE | Admit: 2021-11-28 | Discharge: 2021-11-28 | Disposition: A | Discharge status: Home / Self Care | Payer: Medicaid Other | Source: Ambulatory Visit | Attending: Cardiology | Admitting: Cardiology

## 2021-11-28 ENCOUNTER — Inpatient Hospital Stay: Payer: Medicaid Other | Admitting: Gynecologic Oncology

## 2021-11-28 ENCOUNTER — Other Ambulatory Visit: Payer: Self-pay

## 2021-11-28 DIAGNOSIS — I25118 Atherosclerotic heart disease of native coronary artery with other forms of angina pectoris: Secondary | ICD-10-CM

## 2021-11-28 DIAGNOSIS — Z01810 Encounter for preprocedural cardiovascular examination: Secondary | ICD-10-CM | POA: Diagnosis present

## 2021-11-28 MED ORDER — TECHNETIUM TC 99M TETROFOSMIN IV KIT
29.4000 | PACK | Freq: Once | INTRAVENOUS | Status: AC | PRN
  Administered 2021-11-28: 29.4 via INTRAVENOUS
  Filled 2021-11-28: qty 30

## 2021-11-28 MED ORDER — REGADENOSON 0.4 MG/5ML IV SOLN
0.4000 mg | Freq: Once | INTRAVENOUS | Status: AC
  Administered 2021-11-28: 0.4 mg via INTRAVENOUS

## 2021-11-28 NOTE — Progress Notes (Signed)
Patient here for a pre-operative appointment prior to her scheduled surgery on December 06, 2021. She is scheduled for robotic assisted total laparoscopic hysterectomy, bilateral salpingo-oophorectomy, possible sentinel lymph node biopsy, possible laparotomy, possible dilation and curettage of the uterus with Mirena IUD placement if unable to tolerate trendelenburg positioning for robotic surgery.  She had her pre-admission testing appointment at [facility] on 11/23/21.  The surgery was discussed in detail.  See after visit summary for additional details. Visual aids used to discuss items related to surgery including the incentive spirometer, sequential compression stockings, foley catheter, IV pump, multi-modal pain regimen including tylenol, photo of the surgical robot, female reproductive system to discuss surgery in detail.    ?  ?Discussed post-op pain management in detail including the aspects of the enhanced recovery pathway. If robotic surgery performed, medications will be sent in post-operatively.  ?  ?Discussed the use of heparin pre-op, SCDs, and measures to take at home to prevent DVT including frequent mobility.  Reportable signs and symptoms of DVT discussed. Post-operative instructions discussed and expectations for after surgery. Incisional care discussed as well including reportable signs and symptoms including erythema, drainage, wound separation.  ?   ?10 minutes spent with the patient.  Verbalizing understanding of material discussed. No needs or concerns voiced at the end of the visit.   Advised patient and family to call for any needs. Pre-op labs reviewed along with CT scan results. All questions answered.This appointment is included in the global surgical bundle as pre-operative teaching and has no  charge.     ?

## 2021-11-28 NOTE — Telephone Encounter (Signed)
Received surgical optimization and gave to Dr Berline Lopes. ?

## 2021-11-29 ENCOUNTER — Other Ambulatory Visit: Payer: Self-pay

## 2021-11-29 ENCOUNTER — Ambulatory Visit (HOSPITAL_COMMUNITY)
Admission: RE | Admit: 2021-11-29 | Discharge: 2021-11-29 | Disposition: A | Discharge status: Home / Self Care | Payer: Medicaid Other | Source: Ambulatory Visit | Attending: Cardiology | Admitting: Cardiology

## 2021-11-29 LAB — MYOCARDIAL PERFUSION IMAGING
LV dias vol: 74 mL (ref 46–106)
LV sys vol: 21 mL
Nuc Stress EF: 71 %
Peak HR: 103 {beats}/min
Rest HR: 87 {beats}/min
SDS: 8
SRS: 0
SSS: 8
ST Depression (mm): 0 mm
Stress Nuclear Isotope Dose: 29.4 mCi
TID: 0.7

## 2021-11-29 MED ORDER — TECHNETIUM TC 99M TETROFOSMIN IV KIT
30.5000 | PACK | Freq: Once | INTRAVENOUS | Status: AC | PRN
  Administered 2021-11-29: 30.5 via INTRAVENOUS

## 2021-11-30 ENCOUNTER — Telehealth (HOSPITAL_BASED_OUTPATIENT_CLINIC_OR_DEPARTMENT_OTHER): Payer: Self-pay

## 2021-11-30 ENCOUNTER — Ambulatory Visit (HOSPITAL_COMMUNITY): Payer: Medicaid Other

## 2021-11-30 NOTE — Anesthesia Preprocedure Evaluation (Addendum)
Anesthesia Evaluation  ?Patient identified by MRN, date of birth, ID band ?Patient awake ? ? ? ?Reviewed: ?Allergy & Precautions, NPO status , Patient's Chart, lab work & pertinent test results ? ?History of Anesthesia Complications ?Negative for: history of anesthetic complications ? ?Airway ?Mallampati: III ? ?TM Distance: >3 FB ?Neck ROM: Full ? ? ? Dental ?no notable dental hx. ?(+) Dental Advisory Given ?  ?Pulmonary ?sleep apnea and Continuous Positive Airway Pressure Ventilation , former smoker,  ?Home O2 due to right heart failure ?  ?Pulmonary exam normal ? ? ? ? ? ? ? Cardiovascular ?hypertension, Pt. on medications ?+ Past MI and + Cardiac Stents  ?Normal cardiovascular exam ? ?Myoview ??  The study is normal. The study is low risk. ??  No ST deviation was noted. ??  LV perfusion is normal. ??  Left ventricular function is normal. Nuclear stress EF: 71 %. The left ventricular ejection fraction is hyperdynamic (>65%). End diastolic cavity size is normal. End systolic cavity size is normal. ??  Prior study not available for comparison. ?? ?Normal perfusion. LVEF 71% with normal wall motion. This is a low risk study. No prior for comparison. ? ? ?Echo 02/08/2021 ?1. Left ventricular ejection fraction, by estimation, is 60 to 65%. The  ?left ventricle has normal function. The left ventricle has no regional  ?wall motion abnormalities.  ??2. Right ventricular systolic function is moderately reduced. The right  ?ventricular size is mildly enlarged.  ??3. The mitral valve is normal in structure. No evidence of mitral valve  ?regurgitation. No evidence of mitral stenosis.  ??4. The aortic valve is normal in structure. Aortic valve regurgitation is  ?not visualized. No aortic stenosis is present.  ??5. The inferior vena cava is normal in size with greater than 50%  ?respiratory variability, suggesting right atrial pressure of 3 mmHg. ?  ?Neuro/Psych ?PSYCHIATRIC DISORDERS Anxiety  Depression negative neurological ROS ?   ? GI/Hepatic ?negative GI ROS, Neg liver ROS,   ?Endo/Other  ?diabetesMorbid obesity ? Renal/GU ?negative Renal ROS  ? ?  ?Musculoskeletal ? ?(+) Arthritis ,  ? Abdominal ?  ?Peds ? Hematology ? ?(+) Blood dyscrasia, anemia ,   ?Anesthesia Other Findings ? ? Reproductive/Obstetrics ? ?  ? ? ? ? ? ? ? ? ? ? ? ? ? ?  ?  ? ? ? ? ? ? ?Anesthesia Physical ?Anesthesia Plan ? ?ASA: 3 ? ?Anesthesia Plan: General  ? ?Post-op Pain Management: Celebrex PO (pre-op)* and Tylenol PO (pre-op)*  ? ?Induction: Intravenous ? ?PONV Risk Score and Plan: 4 or greater and Ondansetron, Dexamethasone, Midazolam and Scopolamine patch - Pre-op ? ?Airway Management Planned: Oral ETT ? ?Additional Equipment: None ? ?Intra-op Plan:  ? ?Post-operative Plan: Extubation in OR ? ?Informed Consent: I have reviewed the patients History and Physical, chart, labs and discussed the procedure including the risks, benefits and alternatives for the proposed anesthesia with the patient or authorized representative who has indicated his/her understanding and acceptance.  ? ? ? ?Dental advisory given ? ?Plan Discussed with: Anesthesiologist, CRNA and Surgeon ? ?Anesthesia Plan Comments: (See PAT note 11/23/2021, Konrad Felix Ward, PA-C)  ? ? ? ? ?Anesthesia Quick Evaluation ? ?

## 2021-11-30 NOTE — Progress Notes (Signed)
Anesthesia Chart Review ? ? Case: [id number] Date/Time: 12/06/21 1145  ? Procedures:  ?    XI ROBOTIC ASSISTED TOTAL HYSTERECTOMY WITH BILATERAL SALPINGO OOPHORECTOMY ?    POSSIBLE SENTINEL NODE BIOPSY ?    POSSIBLE LAPAROTOMY ?    POSSIBLE DILATATION AND CURETTAGE ?    POSSIBLE INTRAUTERINE DEVICE (IUD) INSERTION MIRENA  ? Anesthesia type: General  ? Pre-op diagnosis: ENDOMETRIAL CANCER  ? Location: [facility] ROOM 05 / [facility]  ? Surgeons: Lafonda Mosses, MD  ? ?  ? ? ?DISCUSSION:61 y.o. former smoker with h/o HTN, DM II, CAD (DES), CHF, sleep apnea, endometrial cancer scheduled for above procedure 12/06/2021 with Dr. Jeral Pinch.  ? ?Pt seen by cardiology 11/22/21 for preoperative evaluation. Per OV note, "Preop -pending upcoming of the surgery due to stage I uterine cancer. urgery anticipated to be difficult due to need for trendelenburg positioning and insufflation.  Upcoming follow-up 11/28/2021 with NP at [facility] office. According to the Revised Cardiac Risk Index (RCRI), her Perioperative Risk of Major Cardiac Event is (%): 11. Her Functional Capacity in METs is: 3.63 according to the Duke Activity Status Index (DASI). Plan for lexiscan myoview due to poor exercise tolerance, exertional dyspnea, and complex CAD surgery prior to surgery. This has been scheduled for 11/28/21 and 11/29/21 as required 2 day study due to BMI. Per Dr. Burt Knack okay to hold Prasugrel for at least 7 days prior to procedure. It would be best to stay on low dose Aspirin but could be held if absolutely necessary. Will route to surgical team so they are aware." ? ?Stress test 11/29/2021 low risk study. Per cardiology, "May proceed with surgery as scheduled." ? ?Anticipate pt can proceed with planned procedure barring acute status change.   ?VS: Pulse 87   Temp 36.9 ?C (Oral)   Resp 18   Ht '5\' 2"'$  (1.575 m)   Wt 131.1 kg   LMP 10/30/2014 (Approximate)   SpO2 100% Comment: 2 L. O2  BMI 52.86 kg/m?  ? ?PROVIDERS: ?Charlott Rakes, MD is PCP   ? ? ?LABS: Labs reviewed: Acceptable for surgery. ?(all labs ordered are listed, but only abnormal results are displayed) ? ?Labs Reviewed  ?CBC - Abnormal; Notable for the following components:  ?    Result Value  ? WBC 12.7 (*)   ? Hemoglobin 11.6 (*)   ? RDW 16.8 (*)   ? All other components within normal limits  ?COMPREHENSIVE METABOLIC PANEL - Abnormal; Notable for the following components:  ? Sodium 133 (*)   ? Glucose, Bld 195 (*)   ? Creatinine, Ser 1.15 (*)   ? Calcium 8.8 (*)   ? GFR, Estimated 55 (*)   ? All other components within normal limits  ?HEMOGLOBIN A1C - Abnormal; Notable for the following components:  ? Hgb A1c MFr Bld 7.3 (*)   ? All other components within normal limits  ?GLUCOSE, CAPILLARY - Abnormal; Notable for the following components:  ? Glucose-Capillary 204 (*)   ? All other components within normal limits  ?LIPID PANEL - Abnormal; Notable for the following components:  ? HDL 39 (*)   ? All other components within normal limits  ? ? ? ?IMAGES: ? ? ?EKG: ?11/22/21 ?Rate 91 bpm  ?RBBB ?LAD ?IVCD ? ?CV: ?Stress Test 11/29/2021 ?  The study is normal. The study is low risk. ?  No ST deviation was noted. ?  LV perfusion is normal. ?  Left ventricular function is normal. Nuclear stress EF: 71 %.  The left ventricular ejection fraction is hyperdynamic (>65%). End diastolic cavity size is normal. End systolic cavity size is normal. ?  Prior study not available for comparison. ?  ?Normal perfusion. LVEF 71% with normal wall motion. This is a low risk study. No prior for comparison. ? ?Echo 02/08/2021 ?1. Left ventricular ejection fraction, by estimation, is 60 to 65%. The  ?left ventricle has normal function. The left ventricle has no regional  ?wall motion abnormalities.  ? 2. Right ventricular systolic function is moderately reduced. The right  ?ventricular size is mildly enlarged.  ? 3. The mitral valve is normal in structure. No evidence of mitral valve  ?regurgitation. No evidence of mitral  stenosis.  ? 4. The aortic valve is normal in structure. Aortic valve regurgitation is  ?not visualized. No aortic stenosis is present.  ? 5. The inferior vena cava is normal in size with greater than 50%  ?respiratory variability, suggesting right atrial pressure of 3 mmHg.  ?Past Medical History:  ?Diagnosis Date  ? Anemia   ? Arthritis   ? "back, hands, hips" (02/22/2015)  ? CAD (coronary artery disease)   ? a. complex LAD/diagonal bifurcation PCI in 2010. b. STEMI 10/2019 s/p PTCA/DES x1 to mLAD overlapping the old stent, residual disease treated medicaly.  ? Cancer [facility])   ? uterine  ? CHF (congestive heart failure) ([place])   ? Depression   ? Diabetes mellitus ([place])   ? "started when I was pregnant; not sure if it was type 1 or type 2"   ? Hypercholesterolemia   ? Hypertension   ? Morbid obesity ([place])   ? 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 Neuromuscular disorder ([place])   ? neuropathy feet  ? Sleep apnea   ? cpap/  ? ? ?Past Surgical History:  ?Procedure Laterality Date  ? CARDIAC CATHETERIZATION  12/02/2012  ? CHOLECYSTECTOMY OPEN  09/04/1987  ? CORONARY ANGIOPLASTY WITH STENT PLACEMENT  09/03/2009  ? CORONARY/GRAFT ACUTE MI REVASCULARIZATION N/A 10/26/2019  ? Procedure: CORONARY/GRAFT ACUTE MI REVASCULARIZATION;  Surgeon: Burnell Blanks, MD;  Location: [place] CV LAB;  Service: Cardiovascular;  Laterality: N/A;  ? LEFT HEART CATH AND CORONARY ANGIOGRAPHY N/A 10/26/2019  ? Procedure: LEFT HEART CATH AND CORONARY ANGIOGRAPHY;  Surgeon: Burnell Blanks, MD;  Location: [place] CV LAB;  Service: Cardiovascular;  Laterality: N/A;  ? LEFT HEART CATHETERIZATION WITH CORONARY ANGIOGRAM N/A 12/25/2012  ? Procedure: LEFT HEART CATHETERIZATION WITH CORONARY ANGIOGRAM;  Surgeon: Sherren Mocha, MD;  Location: [facility] CATH LAB;  Service: Cardiovascular;  Laterality: N/A;  ? UMBILICAL HERNIA REPAIR  05/04/1989  ? doesn't think they used mesh  ? ? ?MEDICATIONS: ? Acetaminophen-Caff-Pyrilamine (MIDOL  COMPLETE) 500-60-15 MG TABS  ? aspirin 81 MG EC tablet  ? atorvastatin (LIPITOR) 80 MG tablet  ? Blood Glucose Monitoring Suppl (TRUE METRIX METER) DEVI  ? empagliflozin (JARDIANCE) 10 MG TABS tablet  ? FLUoxetine (PROZAC) 20 MG capsule  ? furosemide (LASIX) 40 MG tablet  ? gabapentin (NEURONTIN) 300 MG capsule  ? glucose blood (TRUE METRIX BLOOD GLUCOSE TEST) test strip  ? insulin detemir (LEVEMIR FLEXTOUCH) 100 UNIT/ML FlexPen  ? Insulin Pen Needle (NOVOFINE) 30G X 8 MM MISC  ? Insulin Syringe-Needle U-100 30G X 1/2" 0.3 ML MISC  ? megestrol (MEGACE) 40 MG tablet  ? Misc. Devices MISC  ? Multiple Minerals (CALCIUM-MAGNESIUM-ZINC) TABS  ? nitroGLYCERIN (NITROSTAT) 0.4 MG SL tablet  ? prasugrel (EFFIENT) 5 MG TABS tablet  ?  sacubitril-valsartan (ENTRESTO) 24-26 MG  ? Semaglutide,0.25 or 0.'5MG'$ /DOS, (OZEMPIC, 0.25 OR 0.5 MG/DOSE,) 2 MG/1.5ML SOPN  ? TRUEPLUS LANCETS 30G MISC  ? vitamin B-12 (CYANOCOBALAMIN) 1000 MCG tablet  ? ?No current facility-administered medications for this encounter.  ? ?Konrad Felix Ward, PA-C ?[facility] ?[phone] ? ? ? ? ? ? ? ?

## 2021-11-30 NOTE — Telephone Encounter (Addendum)
Results called to patient who verbalizes understanding!  ? ? ? ?----- Message from Loel Dubonnet, NP sent at 11/30/2021  7:38 AM EDT ----- ?Stress test low risk study. May proceed with surgery as scheduled.  ? ?I have made surgical team aware.  ?

## 2021-11-30 NOTE — Progress Notes (Signed)
Addendum 11/30/21: ? ?Lexiscan Myoview 11/2021 low risk study.  Per AHA/ACC guidelines she is deemed acceptable risk for the planned procedure without additional cardiovascular testing.  Will route to surgical team so they are aware. ?

## 2021-12-01 ENCOUNTER — Telehealth: Payer: Self-pay

## 2021-12-01 NOTE — Telephone Encounter (Signed)
Patient contacted to schedule mammogram.  ? ?RE: Mobile Mammo event located at: ? ?[facility]  ?      ?[address]    ?[place] [address]    ? ?Date: April 7th  ? ?Patient declined.  ?

## 2021-12-05 ENCOUNTER — Telehealth: Payer: Self-pay

## 2021-12-05 NOTE — Telephone Encounter (Signed)
Telephone call to check on pre-operative status.  Patient compliant with pre-operative instructions.  Reinforced nothing to eat after midnight. Clear liquids until 10:00am. Patient to arrive at 10:45am.  No questions or concerns voiced.  Instructed to call for any needs.  

## 2021-12-06 ENCOUNTER — Ambulatory Visit (HOSPITAL_COMMUNITY): Payer: Medicaid Other | Admitting: Physician Assistant

## 2021-12-06 ENCOUNTER — Other Ambulatory Visit: Payer: Self-pay

## 2021-12-06 ENCOUNTER — Ambulatory Visit (HOSPITAL_COMMUNITY)
Admission: RE | Admit: 2021-12-06 | Discharge: 2021-12-07 | Disposition: A | Discharge status: Home / Self Care | Payer: Medicaid Other | Attending: Gynecologic Oncology | Admitting: Gynecologic Oncology

## 2021-12-06 ENCOUNTER — Ambulatory Visit (HOSPITAL_BASED_OUTPATIENT_CLINIC_OR_DEPARTMENT_OTHER): Payer: Medicaid Other | Admitting: Anesthesiology

## 2021-12-06 ENCOUNTER — Encounter (HOSPITAL_COMMUNITY)
Admission: RE | Disposition: A | Discharge status: Home / Self Care | Payer: Self-pay | Source: Home / Self Care | Attending: Gynecologic Oncology

## 2021-12-06 ENCOUNTER — Encounter (HOSPITAL_COMMUNITY): Payer: Self-pay | Admitting: Gynecologic Oncology

## 2021-12-06 DIAGNOSIS — I252 Old myocardial infarction: Secondary | ICD-10-CM | POA: Diagnosis not present

## 2021-12-06 DIAGNOSIS — C541 Malignant neoplasm of endometrium: Secondary | ICD-10-CM

## 2021-12-06 DIAGNOSIS — I10 Essential (primary) hypertension: Secondary | ICD-10-CM | POA: Diagnosis not present

## 2021-12-06 DIAGNOSIS — Z808 Family history of malignant neoplasm of other organs or systems: Secondary | ICD-10-CM | POA: Insufficient documentation

## 2021-12-06 DIAGNOSIS — Z9049 Acquired absence of other specified parts of digestive tract: Secondary | ICD-10-CM | POA: Insufficient documentation

## 2021-12-06 DIAGNOSIS — G4733 Obstructive sleep apnea (adult) (pediatric): Secondary | ICD-10-CM | POA: Diagnosis not present

## 2021-12-06 DIAGNOSIS — Z6841 Body Mass Index (BMI) 40.0 and over, adult: Secondary | ICD-10-CM | POA: Insufficient documentation

## 2021-12-06 DIAGNOSIS — E118 Type 2 diabetes mellitus with unspecified complications: Secondary | ICD-10-CM

## 2021-12-06 DIAGNOSIS — Z87891 Personal history of nicotine dependence: Secondary | ICD-10-CM | POA: Insufficient documentation

## 2021-12-06 DIAGNOSIS — I50811 Acute right heart failure: Secondary | ICD-10-CM | POA: Diagnosis not present

## 2021-12-06 DIAGNOSIS — Z9981 Dependence on supplemental oxygen: Secondary | ICD-10-CM | POA: Diagnosis not present

## 2021-12-06 DIAGNOSIS — E282 Polycystic ovarian syndrome: Secondary | ICD-10-CM | POA: Diagnosis not present

## 2021-12-06 DIAGNOSIS — F32A Depression, unspecified: Secondary | ICD-10-CM | POA: Diagnosis not present

## 2021-12-06 DIAGNOSIS — E119 Type 2 diabetes mellitus without complications: Secondary | ICD-10-CM | POA: Diagnosis not present

## 2021-12-06 DIAGNOSIS — Z955 Presence of coronary angioplasty implant and graft: Secondary | ICD-10-CM | POA: Diagnosis not present

## 2021-12-06 DIAGNOSIS — Z794 Long term (current) use of insulin: Secondary | ICD-10-CM | POA: Diagnosis not present

## 2021-12-06 DIAGNOSIS — I110 Hypertensive heart disease with heart failure: Secondary | ICD-10-CM | POA: Diagnosis not present

## 2021-12-06 DIAGNOSIS — F419 Anxiety disorder, unspecified: Secondary | ICD-10-CM | POA: Diagnosis not present

## 2021-12-06 HISTORY — PX: ROBOTIC ASSISTED TOTAL HYSTERECTOMY WITH BILATERAL SALPINGO OOPHERECTOMY: SHX6086

## 2021-12-06 LAB — GLUCOSE, CAPILLARY
Glucose-Capillary: 100 mg/dL — ABNORMAL HIGH (ref 70–99)
Glucose-Capillary: 117 mg/dL — ABNORMAL HIGH (ref 70–99)
Glucose-Capillary: 330 mg/dL — ABNORMAL HIGH (ref 70–99)

## 2021-12-06 LAB — TYPE AND SCREEN
ABO/RH(D): O POS
Antibody Screen: NEGATIVE

## 2021-12-06 SURGERY — HYSTERECTOMY, TOTAL, ROBOT-ASSISTED, LAPAROSCOPIC, WITH BILATERAL SALPINGO-OOPHORECTOMY
Anesthesia: General

## 2021-12-06 MED ORDER — ACETAMINOPHEN 500 MG PO TABS
1000.0000 mg | ORAL_TABLET | ORAL | Status: AC
  Administered 2021-12-06: 1000 mg via ORAL
  Filled 2021-12-06: qty 2

## 2021-12-06 MED ORDER — LACTATED RINGERS IV SOLN: INTRAVENOUS | Status: DC

## 2021-12-06 MED ORDER — PHENYLEPHRINE HCL (PRESSORS) 10 MG/ML IV SOLN
INTRAVENOUS | Status: AC
  Filled 2021-12-06: qty 1

## 2021-12-06 MED ORDER — ORAL CARE MOUTH RINSE
15.0000 mL | Freq: Once | OROMUCOSAL | Status: AC
  Administered 2021-12-06: 15 mL via OROMUCOSAL

## 2021-12-06 MED ORDER — KCL IN DEXTROSE-NACL 20-5-0.45 MEQ/L-%-% IV SOLN
INTRAVENOUS | Status: DC
  Filled 2021-12-06 (×2): qty 1000

## 2021-12-06 MED ORDER — CELECOXIB 200 MG PO CAPS
200.0000 mg | ORAL_CAPSULE | Freq: Once | ORAL | Status: AC
  Administered 2021-12-06: 200 mg via ORAL
  Filled 2021-12-06: qty 1

## 2021-12-06 MED ORDER — ONDANSETRON HCL 4 MG/2ML IJ SOLN: 4.0000 mg | Freq: Four times a day (QID) | INTRAMUSCULAR | Status: DC | PRN

## 2021-12-06 MED ORDER — LIDOCAINE 2% (20 MG/ML) 5 ML SYRINGE
INTRAMUSCULAR | Status: DC | PRN
Start: 2021-12-06 — End: 2021-12-06
  Administered 2021-12-06: 100 mg via INTRAVENOUS

## 2021-12-06 MED ORDER — LIDOCAINE HCL (PF) 2 % IJ SOLN
INTRAMUSCULAR | Status: AC
  Filled 2021-12-06: qty 5

## 2021-12-06 MED ORDER — ROCURONIUM BROMIDE 10 MG/ML (PF) SYRINGE
PREFILLED_SYRINGE | INTRAVENOUS | Status: AC
  Filled 2021-12-06: qty 10

## 2021-12-06 MED ORDER — SCOPOLAMINE 1 MG/3DAYS TD PT72
1.0000 | MEDICATED_PATCH | TRANSDERMAL | Status: DC
  Administered 2021-12-06: 1.5 mg via TRANSDERMAL
  Filled 2021-12-06: qty 1

## 2021-12-06 MED ORDER — CHLORHEXIDINE GLUCONATE 0.12 % MT SOLN: 15.0000 mL | Freq: Once | OROMUCOSAL | Status: AC

## 2021-12-06 MED ORDER — INSULIN DETEMIR 100 UNIT/ML ~~LOC~~ SOLN
16.0000 [IU] | Freq: Every day | SUBCUTANEOUS | Status: DC
  Administered 2021-12-06: 16 [IU] via SUBCUTANEOUS
  Filled 2021-12-06 (×2): qty 0.16

## 2021-12-06 MED ORDER — MIDAZOLAM HCL 5 MG/5ML IJ SOLN
INTRAMUSCULAR | Status: DC | PRN
  Administered 2021-12-06: 2 mg via INTRAVENOUS

## 2021-12-06 MED ORDER — BUPIVACAINE HCL 0.25 % IJ SOLN
INTRAMUSCULAR | Status: DC | PRN
  Administered 2021-12-06: 30 mL

## 2021-12-06 MED ORDER — AMISULPRIDE (ANTIEMETIC) 5 MG/2ML IV SOLN
10.0000 mg | Freq: Once | INTRAVENOUS | Status: DC | PRN
  Administered 2021-12-06: 10 mg via INTRAVENOUS

## 2021-12-06 MED ORDER — PHENYLEPHRINE 40 MCG/ML (10ML) SYRINGE FOR IV PUSH (FOR BLOOD PRESSURE SUPPORT)
PREFILLED_SYRINGE | INTRAVENOUS | Status: DC | PRN
  Administered 2021-12-06: 120 ug via INTRAVENOUS

## 2021-12-06 MED ORDER — FENTANYL CITRATE (PF) 250 MCG/5ML IJ SOLN
INTRAMUSCULAR | Status: AC
  Filled 2021-12-06: qty 5

## 2021-12-06 MED ORDER — EMPAGLIFLOZIN 10 MG PO TABS
10.0000 mg | ORAL_TABLET | Freq: Every day | ORAL | Status: DC
  Administered 2021-12-07: 10 mg via ORAL
  Filled 2021-12-06: qty 1

## 2021-12-06 MED ORDER — DEXAMETHASONE SODIUM PHOSPHATE 4 MG/ML IJ SOLN
4.0000 mg | INTRAMUSCULAR | Status: AC
  Administered 2021-12-06: 4 mg via INTRAVENOUS

## 2021-12-06 MED ORDER — FENTANYL CITRATE (PF) 100 MCG/2ML IJ SOLN
INTRAMUSCULAR | Status: DC | PRN
  Administered 2021-12-06 (×3): 50 ug via INTRAVENOUS
  Administered 2021-12-06: 100 ug via INTRAVENOUS

## 2021-12-06 MED ORDER — INSULIN ASPART 100 UNIT/ML IJ SOLN
0.0000 [IU] | Freq: Three times a day (TID) | INTRAMUSCULAR | Status: DC
  Administered 2021-12-07: 15 [IU] via SUBCUTANEOUS
  Administered 2021-12-07: 11 [IU] via SUBCUTANEOUS

## 2021-12-06 MED ORDER — LABETALOL HCL 5 MG/ML IV SOLN
INTRAVENOUS | Status: DC | PRN
  Administered 2021-12-06 (×2): 5 mg via INTRAVENOUS

## 2021-12-06 MED ORDER — LEVONORGESTREL 20 MCG/DAY IU IUD
1.0000 | INTRAUTERINE_SYSTEM | INTRAUTERINE | Status: DC
  Filled 2021-12-06: qty 1

## 2021-12-06 MED ORDER — CLEVIDIPINE BUTYRATE 0.5 MG/ML IV EMUL
0.0000 mg/h | INTRAVENOUS | Status: DC
  Filled 2021-12-06: qty 50

## 2021-12-06 MED ORDER — DEXAMETHASONE SODIUM PHOSPHATE 10 MG/ML IJ SOLN
INTRAMUSCULAR | Status: AC
  Filled 2021-12-06: qty 1

## 2021-12-06 MED ORDER — CEFAZOLIN IN SODIUM CHLORIDE 3-0.9 GM/100ML-% IV SOLN
INTRAVENOUS | Status: AC
  Filled 2021-12-06: qty 100

## 2021-12-06 MED ORDER — SENNOSIDES-DOCUSATE SODIUM 8.6-50 MG PO TABS: 2.0000 | ORAL_TABLET | Freq: Every day | ORAL | 0 refills | Status: DC

## 2021-12-06 MED ORDER — ONDANSETRON HCL 4 MG/2ML IJ SOLN
INTRAMUSCULAR | Status: AC
  Filled 2021-12-06: qty 2

## 2021-12-06 MED ORDER — GABAPENTIN 300 MG PO CAPS
600.0000 mg | ORAL_CAPSULE | Freq: Every day | ORAL | Status: DC
  Administered 2021-12-06: 600 mg via ORAL
  Filled 2021-12-06: qty 2

## 2021-12-06 MED ORDER — ATORVASTATIN CALCIUM 80 MG PO TABS
80.0000 mg | ORAL_TABLET | Freq: Every evening | ORAL | Status: DC
  Filled 2021-12-06: qty 1

## 2021-12-06 MED ORDER — SENNOSIDES-DOCUSATE SODIUM 8.6-50 MG PO TABS
2.0000 | ORAL_TABLET | Freq: Every day | ORAL | Status: DC
  Administered 2021-12-06: 2 via ORAL
  Filled 2021-12-06: qty 2

## 2021-12-06 MED ORDER — PROPOFOL 10 MG/ML IV BOLUS
INTRAVENOUS | Status: AC
  Filled 2021-12-06: qty 20

## 2021-12-06 MED ORDER — FENTANYL CITRATE PF 50 MCG/ML IJ SOSY: 25.0000 ug | PREFILLED_SYRINGE | INTRAMUSCULAR | Status: DC | PRN

## 2021-12-06 MED ORDER — DEXTROSE 5 % IV SOLN
INTRAVENOUS | Status: DC | PRN
  Administered 2021-12-06: 3 g via INTRAVENOUS

## 2021-12-06 MED ORDER — ACETAMINOPHEN 500 MG PO TABS
1000.0000 mg | ORAL_TABLET | Freq: Four times a day (QID) | ORAL | Status: DC
  Administered 2021-12-06 – 2021-12-07 (×4): 1000 mg via ORAL
  Filled 2021-12-06 (×4): qty 2

## 2021-12-06 MED ORDER — BUPIVACAINE HCL (PF) 0.25 % IJ SOLN
INTRAMUSCULAR | Status: AC
  Filled 2021-12-06: qty 30

## 2021-12-06 MED ORDER — LABETALOL HCL 5 MG/ML IV SOLN
INTRAVENOUS | Status: AC
  Filled 2021-12-06: qty 4

## 2021-12-06 MED ORDER — SUGAMMADEX SODIUM 200 MG/2ML IV SOLN
INTRAVENOUS | Status: DC | PRN
  Administered 2021-12-06: 300 mg via INTRAVENOUS

## 2021-12-06 MED ORDER — ROCURONIUM BROMIDE 10 MG/ML (PF) SYRINGE
PREFILLED_SYRINGE | INTRAVENOUS | Status: DC | PRN
  Administered 2021-12-06: 20 mg via INTRAVENOUS
  Administered 2021-12-06: 60 mg via INTRAVENOUS
  Administered 2021-12-06: 40 mg via INTRAVENOUS

## 2021-12-06 MED ORDER — ENOXAPARIN SODIUM 30 MG/0.3ML IJ SOSY
30.0000 mg | PREFILLED_SYRINGE | INTRAMUSCULAR | Status: DC
  Administered 2021-12-07: 30 mg via SUBCUTANEOUS
  Filled 2021-12-06: qty 0.3

## 2021-12-06 MED ORDER — AMISULPRIDE (ANTIEMETIC) 5 MG/2ML IV SOLN
INTRAVENOUS | Status: AC
  Filled 2021-12-06: qty 4

## 2021-12-06 MED ORDER — DEXAMETHASONE SODIUM PHOSPHATE 10 MG/ML IJ SOLN: INTRAMUSCULAR | Status: DC | PRN

## 2021-12-06 MED ORDER — HEPARIN SODIUM (PORCINE) 5000 UNIT/ML IJ SOLN
5000.0000 [IU] | INTRAMUSCULAR | Status: AC
  Administered 2021-12-06: 5000 [IU] via SUBCUTANEOUS
  Filled 2021-12-06: qty 1

## 2021-12-06 MED ORDER — TRAMADOL HCL 50 MG PO TABS: 50.0000 mg | ORAL_TABLET | Freq: Four times a day (QID) | ORAL | 0 refills | Status: DC | PRN

## 2021-12-06 MED ORDER — FLUOXETINE HCL 20 MG PO CAPS
20.0000 mg | ORAL_CAPSULE | Freq: Every day | ORAL | Status: DC
  Administered 2021-12-07: 20 mg via ORAL
  Filled 2021-12-06: qty 1

## 2021-12-06 MED ORDER — HYDROMORPHONE HCL 1 MG/ML IJ SOLN: 0.5000 mg | INTRAMUSCULAR | Status: DC | PRN

## 2021-12-06 MED ORDER — ONDANSETRON HCL 4 MG PO TABS: 4.0000 mg | ORAL_TABLET | Freq: Four times a day (QID) | ORAL | Status: DC | PRN

## 2021-12-06 MED ORDER — PROPOFOL 10 MG/ML IV BOLUS
INTRAVENOUS | Status: DC | PRN
  Administered 2021-12-06: 50 mg via INTRAVENOUS
  Administered 2021-12-06: 140 mg via INTRAVENOUS

## 2021-12-06 MED ORDER — ACETAMINOPHEN 500 MG PO TABS: 1000.0000 mg | ORAL_TABLET | Freq: Once | ORAL | Status: DC

## 2021-12-06 MED ORDER — TRAMADOL HCL 50 MG PO TABS
100.0000 mg | ORAL_TABLET | Freq: Four times a day (QID) | ORAL | Status: DC | PRN
  Administered 2021-12-06: 100 mg via ORAL
  Filled 2021-12-06: qty 2

## 2021-12-06 MED ORDER — LACTATED RINGERS IR SOLN
Status: DC | PRN
  Administered 2021-12-06: 1000 mL

## 2021-12-06 MED ORDER — SUCCINYLCHOLINE CHLORIDE 200 MG/10ML IV SOSY
PREFILLED_SYRINGE | INTRAVENOUS | Status: DC | PRN
  Administered 2021-12-06: 120 mg via INTRAVENOUS

## 2021-12-06 MED ORDER — INSULIN ASPART 100 UNIT/ML IJ SOLN
0.0000 [IU] | Freq: Every day | INTRAMUSCULAR | Status: DC
  Administered 2021-12-06: 4 [IU] via SUBCUTANEOUS

## 2021-12-06 MED ORDER — MIDAZOLAM HCL 2 MG/2ML IJ SOLN
INTRAMUSCULAR | Status: AC
  Filled 2021-12-06: qty 2

## 2021-12-06 MED ORDER — ONDANSETRON HCL 4 MG/2ML IJ SOLN
INTRAMUSCULAR | Status: DC | PRN
  Administered 2021-12-06: 4 mg via INTRAVENOUS

## 2021-12-06 MED ORDER — OXYCODONE HCL 5 MG PO TABS
5.0000 mg | ORAL_TABLET | ORAL | Status: DC | PRN
  Administered 2021-12-06: 5 mg via ORAL
  Filled 2021-12-06: qty 1

## 2021-12-06 MED ORDER — NITROGLYCERIN 0.4 MG SL SUBL: 0.4000 mg | SUBLINGUAL_TABLET | SUBLINGUAL | Status: DC | PRN

## 2021-12-06 MED ORDER — SCOPOLAMINE 1 MG/3DAYS TD PT72: 1.0000 | MEDICATED_PATCH | TRANSDERMAL | Status: DC

## 2021-12-06 SURGICAL SUPPLY — 97 items
ADH SKN CLS APL DERMABOND .7 (GAUZE/BANDAGES/DRESSINGS) ×1
AGENT HMST KT MTR STRL THRMB (HEMOSTASIS)
APL ESCP 34 STRL LF DISP (HEMOSTASIS)
APPLICATOR SURGIFLO ENDO (HEMOSTASIS) IMPLANT
BACTOSHIELD CHG 4% 4OZ (MISCELLANEOUS) ×1
BAG COUNTER SPONGE SURGICOUNT (BAG) IMPLANT
BAG LAPAROSCOPIC 12 15 PORT 16 (BASKET) IMPLANT
BAG RETRIEVAL 10 (BASKET)
BAG RETRIEVAL 12/15 (BASKET) ×2
BAG SPNG CNTER NS LX DISP (BAG)
BLADE SURG SZ10 CARB STEEL (BLADE) IMPLANT
CATH ROBINSON RED A/P 16FR (CATHETERS) ×2 IMPLANT
COVER BACK TABLE 60X90IN (DRAPES) ×2 IMPLANT
COVER TIP SHEARS 8 DVNC (MISCELLANEOUS) ×1 IMPLANT
COVER TIP SHEARS 8MM DA VINCI (MISCELLANEOUS) ×1
DERMABOND ADVANCED (GAUZE/BANDAGES/DRESSINGS) ×1
DERMABOND ADVANCED .7 DNX12 (GAUZE/BANDAGES/DRESSINGS) ×1 IMPLANT
DRAPE ARM DVNC X/XI (DISPOSABLE) ×4 IMPLANT
DRAPE COLUMN DVNC XI (DISPOSABLE) ×1 IMPLANT
DRAPE DA VINCI XI ARM (DISPOSABLE) ×4
DRAPE DA VINCI XI COLUMN (DISPOSABLE) ×1
DRAPE SHEET LG 3/4 BI-LAMINATE (DRAPES) ×2 IMPLANT
DRAPE SURG IRRIG POUCH 19X23 (DRAPES) ×2 IMPLANT
DRAPE UNDERBUTTOCKS STRL (DISPOSABLE) ×1 IMPLANT
DRSG OPSITE POSTOP 4X6 (GAUZE/BANDAGES/DRESSINGS) IMPLANT
DRSG OPSITE POSTOP 4X8 (GAUZE/BANDAGES/DRESSINGS) IMPLANT
DRSG TELFA 3X8 NADH (GAUZE/BANDAGES/DRESSINGS) IMPLANT
ELECT PENCIL ROCKER SW 15FT (MISCELLANEOUS) IMPLANT
ELECT REM PT RETURN 15FT ADLT (MISCELLANEOUS) ×2 IMPLANT
GAUZE 4X4 16PLY ~~LOC~~+RFID DBL (SPONGE) ×2 IMPLANT
GLOVE SURG ENC MOIS LTX SZ6 (GLOVE) ×7 IMPLANT
GLOVE SURG ENC MOIS LTX SZ6.5 (GLOVE) ×4 IMPLANT
GOWN STRL REUS W/ TWL LRG LVL3 (GOWN DISPOSABLE) ×4 IMPLANT
GOWN STRL REUS W/TWL LRG LVL3 (GOWN DISPOSABLE) ×8
HOLDER FOLEY CATH W/STRAP (MISCELLANEOUS) IMPLANT
IRRIG SUCT STRYKERFLOW 2 WTIP (MISCELLANEOUS) ×2
IRRIGATION SUCT STRKRFLW 2 WTP (MISCELLANEOUS) ×1 IMPLANT
KIT BASIN OR (CUSTOM PROCEDURE TRAY) ×2 IMPLANT
KIT PROCEDURE DA VINCI SI (MISCELLANEOUS)
KIT PROCEDURE DVNC SI (MISCELLANEOUS) IMPLANT
KIT TURNOVER KIT A (KITS) IMPLANT
LIGASURE IMPACT 36 18CM CVD LR (INSTRUMENTS) IMPLANT
LIGASURE VESSEL 5MM BLUNT TIP (ELECTROSURGICAL) ×1 IMPLANT
MANIPULATOR ADVINCU DEL 3.0 PL (MISCELLANEOUS) IMPLANT
MANIPULATOR ADVINCU DEL 3.5 PL (MISCELLANEOUS) ×1 IMPLANT
MANIPULATOR UTERINE 4.5 ZUMI (MISCELLANEOUS) IMPLANT
NDL HYPO 21X1.5 SAFETY (NEEDLE) ×1 IMPLANT
NDL SPNL 18GX3.5 QUINCKE PK (NEEDLE) IMPLANT
NDL SPNL 22GX3.5 QUINCKE BK (NEEDLE) ×1 IMPLANT
NEEDLE HYPO 21X1.5 SAFETY (NEEDLE) ×2 IMPLANT
NEEDLE SPNL 18GX3.5 QUINCKE PK (NEEDLE) IMPLANT
NEEDLE SPNL 22GX3.5 QUINCKE BK (NEEDLE) IMPLANT
NS IRRIG 1000ML POUR BTL (IV SOLUTION) ×1 IMPLANT
OBTURATOR OPTICAL LONG 8 DVNC (TROCAR) IMPLANT
OBTURATOR OPTICAL LONG 8MM (TROCAR) ×1
OBTURATOR OPTICAL STANDARD 8MM (TROCAR) ×2
OBTURATOR OPTICAL STND 8 DVNC (TROCAR) ×2
OBTURATOR OPTICALSTD 8 DVNC (TROCAR) ×1 IMPLANT
PACK LITHOTOMY IV (CUSTOM PROCEDURE TRAY) ×1 IMPLANT
PACK ROBOT GYN CUSTOM WL (TRAY / TRAY PROCEDURE) ×2 IMPLANT
PAD DRESSING TELFA 3X8 NADH (GAUZE/BANDAGES/DRESSINGS) ×1 IMPLANT
PAD OB MATERNITY 4.3X12.25 (PERSONAL CARE ITEMS) ×1 IMPLANT
PAD POSITIONING PINK XL (MISCELLANEOUS) ×2 IMPLANT
PENCIL SMOKE EVACUATOR (MISCELLANEOUS) IMPLANT
PORT ACCESS TROCAR AIRSEAL 12 (TROCAR) ×1 IMPLANT
PORT ACCESS TROCAR AIRSEAL 5M (TROCAR)
RETRACTOR LAPSCP 12X46 CVD (ENDOMECHANICALS) IMPLANT
RTRCTR LAPSCP 12X46 CVD (ENDOMECHANICALS) ×2
SCRUB CHG 4% DYNA-HEX 4OZ (MISCELLANEOUS) ×1 IMPLANT
SEAL CANN UNIV 5-8 DVNC XI (MISCELLANEOUS) ×4 IMPLANT
SEAL XI 5MM-8MM UNIVERSAL (MISCELLANEOUS) ×4
SET TRI-LUMEN FLTR TB AIRSEAL (TUBING) ×2 IMPLANT
SPIKE FLUID TRANSFER (MISCELLANEOUS) ×2 IMPLANT
SPONGE T-LAP 18X18 ~~LOC~~+RFID (SPONGE) IMPLANT
SURGIFLO W/THROMBIN 8M KIT (HEMOSTASIS) IMPLANT
SURGILUBE 2OZ TUBE FLIPTOP (MISCELLANEOUS) ×1 IMPLANT
SUT MNCRL AB 4-0 PS2 18 (SUTURE) IMPLANT
SUT PDS AB 1 TP1 96 (SUTURE) IMPLANT
SUT VIC AB 0 CT1 27 (SUTURE)
SUT VIC AB 0 CT1 27XBRD ANTBC (SUTURE) IMPLANT
SUT VIC AB 2-0 CT1 27 (SUTURE)
SUT VIC AB 2-0 CT1 TAPERPNT 27 (SUTURE) IMPLANT
SUT VIC AB 4-0 PS2 18 (SUTURE) ×4 IMPLANT
SYR 10ML LL (SYRINGE) IMPLANT
SYR BULB IRRIG 60ML STRL (SYRINGE) ×2 IMPLANT
SYS BAG RETRIEVAL 10MM (BASKET)
SYS WOUND ALEXIS 18CM MED (MISCELLANEOUS)
SYSTEM BAG RETRIEVAL 10MM (BASKET) IMPLANT
SYSTEM WOUND ALEXIS 18CM MED (MISCELLANEOUS) IMPLANT
TOWEL OR 17X26 10 PK STRL BLUE (TOWEL DISPOSABLE) ×1 IMPLANT
TOWEL OR NON WOVEN STRL DISP B (DISPOSABLE) ×1 IMPLANT
TRAP SPECIMEN MUCUS 40CC (MISCELLANEOUS) IMPLANT
TRAY FOLEY MTR SLVR 16FR STAT (SET/KITS/TRAYS/PACK) ×2 IMPLANT
TROCAR XCEL NON-BLD 5MMX100MML (ENDOMECHANICALS) IMPLANT
UNDERPAD 30X36 HEAVY ABSORB (UNDERPADS AND DIAPERS) ×4 IMPLANT
WATER STERILE IRR 1000ML POUR (IV SOLUTION) ×2 IMPLANT
YANKAUER SUCT BULB TIP 10FT TU (MISCELLANEOUS) IMPLANT

## 2021-12-06 NOTE — Progress Notes (Signed)
Placed patient on CPAP for the night with pressure set at 14cm and oxygen set at 2lpm.  ?

## 2021-12-06 NOTE — Anesthesia Procedure Notes (Signed)
Procedure Name: Intubation ?Date/Time: 12/06/2021 12:38 PM ?Performed by: Claudia Desanctis, CRNA ?Pre-anesthesia Checklist: Patient identified, Emergency Drugs available, Suction available and Patient being monitored ?Patient Re-evaluated:Patient Re-evaluated prior to induction ?Oxygen Delivery Method: Circle system utilized ?Preoxygenation: Pre-oxygenation with 100% oxygen ?Induction Type: IV induction ?Ventilation: Mask ventilation without difficulty and Two handed mask ventilation required ?Laryngoscope Size: Mac and 3 ?Grade View: Grade I ?Tube type: Oral ?Tube size: 7.0 mm ?Number of attempts: 1 ?Airway Equipment and Method: Stylet ?Placement Confirmation: ETT inserted through vocal cords under direct vision, positive ETCO2 and breath sounds checked- equal and bilateral ?Secured at: 21 cm ?Tube secured with: Tape ?Dental Injury: Teeth and Oropharynx as per pre-operative assessment  ? ? ? ? ?

## 2021-12-06 NOTE — Discharge Instructions (Addendum)
AFTER SURGERY INSTRUCTIONS ?  ?Return to work: 4-6 weeks if applicable ? ?Closely monitor your blood glucose levels and call the office for any questions or concerns. ? ?DO NOT RESUME YOUR EFFIENT FOR 7 DAYS AFTER SURGERY. ?  ?Activity: ?1. Be up and out of the bed during the day.  Take a nap if needed.  You may walk up steps but be careful and use the hand rail.  Stair climbing will tire you more than you think, you may need to stop part way and rest.  ?  ?2. No lifting or straining for 6 weeks over 10 pounds. No pushing, pulling, straining for 6 weeks. ?  ?3. No driving for around 1 week(s). Do not drive if you are taking narcotic pain medicine and make sure that your reaction time has returned.  ?  ?4. You can shower as soon as the next day after surgery. Shower daily.  Use your regular soap and water (not directly on the incision) and pat your incision(s) dry afterwards; don't rub.  No tub baths or submerging your body in water until cleared by your surgeon. If you have the soap that was given to you by pre-surgical testing that was used before surgery, you do not need to use it afterwards because this can irritate your incisions.  ?  ?5. No sexual activity and nothing in the vagina for 8 weeks. ?  ?6. You may experience a small amount of clear drainage from your incisions, which is normal.  If the drainage persists, increases, or changes color please call the office. ?  ?7. Do not use creams, lotions, or ointments such as neosporin on your incisions after surgery until advised by your surgeon because they can cause removal of the dermabond glue on your incisions.   ?  ?8. You may experience vaginal spotting after surgery or around the 6-8 week mark from surgery when the stitches at the top of the vagina begin to dissolve.  The spotting is normal but if you experience heavy bleeding, call our office. ?  ?9. Take Tylenol first for pain and only use narcotic pain medication for severe pain not relieved by the  Tylenol.  Monitor your Tylenol intake to a max of 4,000 mg in a 24 hour period.  ?  ?Diet: ?1. Low sodium Heart Healthy Diet is recommended but you are cleared to resume your normal (before surgery) diet after your procedure. ?  ?2. It is safe to use a laxative, such as Miralax or Colace, if you have difficulty moving your bowels. You have been prescribed Sennakot-S to take at bedtime every evening after surgery to keep bowel movements regular and to prevent constipation.   ?  ?Wound Care: ?1. Keep clean and dry.  Shower daily. ?  ?Reasons to call the Doctor: ?Fever - Oral temperature greater than 100.4 degrees Fahrenheit ?Foul-smelling vaginal discharge ?Difficulty urinating ?Nausea and vomiting ?Increased pain at the site of the incision that is unrelieved with pain medicine. ?Difficulty breathing with or without chest pain ?New calf pain especially if only on one side ?Sudden, continuing increased vaginal bleeding with or without clots. ?  ?Contacts: ?For questions or concerns you should contact: ?  ?Dr. Jeral Pinch at [phone] ?  ?Joylene John, NP at [phone] ?  ?After Hours: call [phone] and have the GYN Oncologist paged/contacted (after 5 pm or on the weekends). ?  ?Messages sent via mychart are for non-urgent matters and are not responded to after hours so for  urgent needs, please call the after hours number. ?  ?  ?

## 2021-12-06 NOTE — Op Note (Signed)
OPERATIVE NOTE ? ?Pre-operative Diagnosis: grade 1 endometrial cancer ? ?Post-operative Diagnosis: same ? ?Operation: Robotic-assisted laparoscopic total hysterectomy with bilateral salpingoophorectomy, laparoscopic hernia reduction ?Extreme morbid obesity requiring additional OR personnel for positioning and retraction. Obesity made retroperitoneal visualization limited and increased the complexity of the case and necessitated additional instrumentation for retraction. Obesity related complexity increased the duration of the procedure by 60 minutes.  ? ? ?Surgeon: Jeral Pinch MD ? ?Assistant Surgeon: Lahoma Crocker MD (an MD assistant was necessary for tissue manipulation, management of robotic instrumentation, retraction and positioning due to the complexity of the case and hospital policies).  ? ?Anesthesia: GET ? ?Urine Output: 650cc ? ?Operative Findings: On EUA, 12 cm mobile uterus.  On intra-abdominal entry, normal appearing upper abdominal survey although right liver edge and diaphragm obscured secondary to adhesions from cholecystectomy.  Normal-appearing omentum with portion of the omentum within a 3 cm umbilical hernia (reduced).  Normal-appearing small and large bowel.  Normal-appearing bilateral fallopian tubes and ovaries.  Uterus 12 cm in quite bulbous.  No obvious adenopathy.  No ascites.  No clear evidence of intra-abdominal or pelvic disease. ? ?Estimated Blood Loss:  150cc     ? ?Total IV Fluids: see I&O flowsheet ?        ?Specimens: uterus, cervix, bilateral tubes and ovaries ?        ?Complications:  None apparent; patient tolerated the procedure well. ?        ?Disposition: PACU - hemodynamically stable. ? ?Procedure Details  ?The patient was seen in the Holding Room. The risks, benefits, complications, treatment options, and expected outcomes were discussed with the patient.  The patient concurred with the proposed plan, giving informed consent.  The site of surgery properly  noted/marked. The patient was identified as Yeni Jiggetts and the procedure verified as a Robotic-assisted hysterectomy with bilateral salpingo oophorectomy.  ? ?After induction of anesthesia, the patient was draped and prepped in the usual sterile manner. Patient was placed in supine position after anesthesia and draped and prepped in the usual sterile manner as follows: Her arms were tucked to her side with all appropriate precautions.  The shoulders were stabilized with padded shoulder blocks applied to the acromium processes.  The patient was placed in the semi-lithotomy position in [place].   ? ?First, a tilt test was performed to assure that patient would tolerate Trendelenburg to proceed.  Although peak pressures were high, they were not much elevated from her baseline.  I had a discussion with the anesthesiologist who felt comfortable proceeding. ? ?The perineum and vagina were prepped with CholoraPrep. The patient was draped after the CholoraPrep had been allowed to dry for 3 minutes.  A Time Out was held and the above information confirmed. OG tube placement was confirmed and to suction. ? ?Next, a 10 mm skin incision was made 1 cm below the subcostal margin in the midclavicular line.  The 5 mm Optiview port and scope was used for direct entry.  Opening pressure was under 10 mm CO2.  The abdomen was insufflated and the findings were noted as above.  The patient was then placed in Trendelenburg with insufflation and again, although peak pressures were noted to be high, they were not significantly elevated compared to what they had been.  We had a discussion again and agreed that it was safe to proceed with surgery.  The patient was then flattened again.  For placement of the trocars, the intra-abdominal pressure was increased to 20 mmHg.  Otherwise, during the procedure, the patient's intra-abdominal pressure did not exceed 12 mmHg. Next, an 8 mm skin incision was made lateral to the umbilicus and a  right and left port were placed about 8 cm lateral to the robot port on the right and left side.  A fourth arm was placed on the right.  The 5 mm assist trocar was exchanged for a 10-12 mm port. All ports were placed under direct visualization.  The patient was placed in steep Trendelenburg.  Bipolar electrocautery was then used laparoscopically to reduce the patient's umbilical hernia.  The robot was docked in the normal manner. ? ?The urethra was prepped with Betadine. Foley catheter was placed.  A sterile speculum was placed in the vagina.  The cervix was grasped with a single-tooth tenaculum. The cervix was dilated with Kennon Rounds dilators.  The 3.5 delineator uterine manipulator with a colpotomizer ring was placed without difficulty.  A pneum occluder balloon was placed over the manipulator.    ? ?The right and left peritoneum were opened parallel to the IP ligament to open the retroperitoneal spaces bilaterally. The round ligaments were transected. The ureter was noted to be on the medial leaf of the broad ligament.  The peritoneum above the ureter was incised and stretched and the infundibulopelvic ligament was skeletonized, cauterized and cut.   ? ?The posterior peritoneum was taken down to the level of the KOH ring.  The anterior peritoneum was also taken down.  The bladder flap was created to the level of the KOH ring.  The uterine artery on the right side was skeletonized, cauterized and cut in the normal manner.  A similar procedure was performed on the left.  The colpotomy was made and the uterus, cervix, bilateral ovaries and tubes were amputated and placed in a 15 mm Endo Catch bag.  The specimen was then delivered through the vagina.  Pedicles were inspected and excellent hemostasis was achieved.   ? ?The colpotomy at the vaginal cuff was closed with Vicryl on a CT1 needle in a running manner.  Irrigation was used and excellent hemostasis was achieved.  At this point in the procedure was completed.   Robotic instruments were removed under direct visulaization.  The robot was undocked. The deep subcutaneous tissue at the 10-12 mm port was closed with 0 Vicryl on a UR-5 needle.  The subcuticular tissue was closed with 4-0 Vicryl and the skin was closed with 4-0 Monocryl in a subcuticular manner.  Dermabond was applied.   ? ?The vagina was swabbed with  minimal bleeding noted.  Given some oozing from the lateral and posterior external vaginal walls, monopolar electrocautery was used to achieve hemostasis.  Foley catheter was then removed.  All sponge, lap and needle counts were correct x  3.  ? ?The patient was transferred to the recovery room in stable condition.  Given major surgery in the setting of her medical comorbidities, notably pulmonary comorbidities, decision was made to keep the patient for observation overnight. ? ?Jeral Pinch, MD ? ?

## 2021-12-06 NOTE — Interval H&P Note (Signed)
History and Physical Interval Note: ? ?12/06/2021 ?11:08 AM ? ?Brenda [name]  has presented today for surgery, with the diagnosis of ENDOMETRIAL CANCER.  The various methods of treatment have been discussed with the patient and family. After consideration of risks, benefits and other options for treatment, the patient has consented to  Procedure(s): ?XI ROBOTIC ASSISTED TOTAL HYSTERECTOMY WITH BILATERAL SALPINGO OOPHORECTOMY (N/A) ?POSSIBLE SENTINEL NODE BIOPSY (N/A) ?POSSIBLE LAPAROTOMY (N/A) ?POSSIBLE DILATATION AND CURETTAGE (N/A) ?POSSIBLE INTRAUTERINE DEVICE (IUD) INSERTION MIRENA (N/A) as a surgical intervention.  The patient's history has been reviewed, patient examined, no change in status, stable for surgery.  I have reviewed the patient's chart and labs.  Questions were answered to the patient's satisfaction.   ? ? ?Brenda [name] ? ? ?

## 2021-12-06 NOTE — Transfer of Care (Signed)
Immediate Anesthesia Transfer of Care Note ? ?Patient: Brenda [name] ? ?Procedure(s) Performed: XI ROBOTIC ASSISTED TOTAL HYSTERECTOMY WITH BILATERAL SALPINGO OOPHORECTOMY ? ?Patient Location: PACU ? ?Anesthesia Type:General ? ?Level of Consciousness: awake ? ?Airway & Oxygen Therapy: Patient Spontanous Breathing and Patient connected to face mask oxygen ? ?Post-op Assessment: Report given to RN and Post -op Vital signs reviewed and stable ? ?Post vital signs: Reviewed and stable ? ?Last Vitals:  ?Vitals Value Taken Time  ?BP 155/94 12/06/21 1615  ?Temp 36.4 ?C 12/06/21 1615  ?Pulse 90 12/06/21 1618  ?Resp 14 12/06/21 1618  ?SpO2 96 % 12/06/21 1618  ?Vitals shown include unvalidated device data. ? ?Last Pain:  ?Vitals:  ? 12/06/21 1128  ?TempSrc: Oral  ?   ? ?  ? ?Complications: No notable events documented. ?

## 2021-12-06 NOTE — Anesthesia Postprocedure Evaluation (Signed)
Anesthesia Post Note ? ?Patient: Brenda [name] ? ?Procedure(s) Performed: XI ROBOTIC ASSISTED TOTAL HYSTERECTOMY WITH BILATERAL SALPINGO OOPHORECTOMY ? ?  ? ?Patient location during evaluation: PACU ?Anesthesia Type: General ?Level of consciousness: sedated ?Pain management: pain level controlled ?Vital Signs Assessment: post-procedure vital signs reviewed and stable ?Respiratory status: spontaneous breathing and respiratory function stable ?Cardiovascular status: stable ?Postop Assessment: no apparent nausea or vomiting ?Anesthetic complications: no ? ? ?No notable events documented. ? ?Last Vitals:  ?Vitals:  ? 12/06/21 1715 12/06/21 1730  ?BP: (!) 166/86 (!) 151/62  ?Pulse: 85 86  ?Resp: 16 19  ?Temp:    ?SpO2: 96% 93%  ?  ?Last Pain:  ?Vitals:  ? 12/06/21 1730  ?TempSrc:   ?PainSc: 0-No pain  ? ? ?  ?  ?  ?  ?  ?  ? ?Illeana Edick DANIEL ? ? ? ? ?

## 2021-12-06 NOTE — Brief Op Note (Signed)
12/06/2021 ? ?4:03 PM ? ?PATIENT:  Brenda [name]  61 y.o. female ? ?PRE-OPERATIVE DIAGNOSIS:  ENDOMETRIAL CANCER ? ?POST-OPERATIVE DIAGNOSIS:  endometrial cancer ? ?PROCEDURE:  Procedure(s): ?XI ROBOTIC ASSISTED TOTAL HYSTERECTOMY WITH BILATERAL SALPINGO OOPHORECTOMY (N/A) ? ?SURGEON:  Surgeon(s) and Role: ?   Lafonda Mosses, MD - Primary ?   Lahoma Crocker, MD - Assisting ? ?ANESTHESIA:   general ? ?EBL:  150 mL  ? ?BLOOD ADMINISTERED:none ? ?DRAINS: none  ? ?LOCAL MEDICATIONS USED:  MARCAINE    ? ?SPECIMEN:  uterus, cervix, bilateral adnexa ? ?DISPOSITION OF SPECIMEN:  PATHOLOGY ? ?COUNTS:  YES ? ?TOURNIQUET:  * No tourniquets in log * ? ?DICTATION: .Note written in EPIC ? ?PLAN OF CARE: Admit for overnight observation ? ?PATIENT DISPOSITION:  PACU - hemodynamically stable. ?  ?Delay start of Pharmacological VTE agent (>24hrs) due to surgical blood loss or risk of bleeding: not applicable ? ?

## 2021-12-07 ENCOUNTER — Other Ambulatory Visit: Payer: Self-pay | Admitting: Pharmacist

## 2021-12-07 ENCOUNTER — Encounter (HOSPITAL_COMMUNITY): Payer: Self-pay | Admitting: Gynecologic Oncology

## 2021-12-07 ENCOUNTER — Other Ambulatory Visit: Payer: Self-pay | Admitting: Family Medicine

## 2021-12-07 ENCOUNTER — Ambulatory Visit: Payer: Medicaid Other | Admitting: Obstetrics & Gynecology

## 2021-12-07 ENCOUNTER — Other Ambulatory Visit: Payer: Self-pay

## 2021-12-07 DIAGNOSIS — C541 Malignant neoplasm of endometrium: Secondary | ICD-10-CM | POA: Diagnosis not present

## 2021-12-07 DIAGNOSIS — Z794 Long term (current) use of insulin: Secondary | ICD-10-CM

## 2021-12-07 LAB — CBC
HCT: 33.3 % — ABNORMAL LOW (ref 36.0–46.0)
Hemoglobin: 10.3 g/dL — ABNORMAL LOW (ref 12.0–15.0)
MCH: 26.1 pg (ref 26.0–34.0)
MCHC: 30.9 g/dL (ref 30.0–36.0)
MCV: 84.5 fL (ref 80.0–100.0)
Platelets: 279 10*3/uL (ref 150–400)
RBC: 3.94 MIL/uL (ref 3.87–5.11)
RDW: 16.2 % — ABNORMAL HIGH (ref 11.5–15.5)
WBC: 14.8 10*3/uL — ABNORMAL HIGH (ref 4.0–10.5)
nRBC: 0 % (ref 0.0–0.2)

## 2021-12-07 LAB — BASIC METABOLIC PANEL
Anion gap: 8 (ref 5–15)
Anion gap: 8 (ref 5–15)
BUN: 22 mg/dL — ABNORMAL HIGH (ref 6–20)
BUN: 22 mg/dL — ABNORMAL HIGH (ref 6–20)
CO2: 22 mmol/L (ref 22–32)
CO2: 22 mmol/L (ref 22–32)
Calcium: 8.6 mg/dL — ABNORMAL LOW (ref 8.9–10.3)
Calcium: 8.5 mg/dL — ABNORMAL LOW (ref 8.9–10.3)
Chloride: 106 mmol/L (ref 98–111)
Chloride: 102 mmol/L (ref 98–111)
Creatinine, Ser: 0.86 mg/dL (ref 0.44–1.00)
Creatinine, Ser: 0.97 mg/dL (ref 0.44–1.00)
GFR, Estimated: >60 mL/min (ref >60)
GFR, Estimated: >60 mL/min (ref >60)
Glucose, Bld: 334 mg/dL — ABNORMAL HIGH (ref 70–99)
Glucose, Bld: 355 mg/dL — ABNORMAL HIGH (ref 70–99)
Potassium: 5.5 mmol/L — ABNORMAL HIGH (ref 3.5–5.1)
Potassium: 4.7 mmol/L (ref 3.5–5.1)
Sodium: 136 mmol/L (ref 135–145)
Sodium: 132 mmol/L — ABNORMAL LOW (ref 135–145)

## 2021-12-07 LAB — GLUCOSE, CAPILLARY
Glucose-Capillary: 331 mg/dL — ABNORMAL HIGH (ref 70–99)
Glucose-Capillary: 291 mg/dL — ABNORMAL HIGH (ref 70–99)

## 2021-12-07 MED ORDER — ENOXAPARIN SODIUM 40 MG/0.4ML IJ SOSY: 40.0000 mg | PREFILLED_SYRINGE | INTRAMUSCULAR | Status: DC

## 2021-12-07 MED ORDER — LEVEMIR FLEXPEN 100 UNIT/ML ~~LOC~~ SOPN
22.0000 [IU] | PEN_INJECTOR | Freq: Two times a day (BID) | SUBCUTANEOUS | 2 refills | Status: DC
  Filled 2021-12-07: qty 15, 34d supply, fill #0

## 2021-12-07 NOTE — Progress Notes (Signed)
Inpatient Diabetes Program Recommendations ? ?AACE/ADA: New Consensus Statement on Inpatient Glycemic Control (2015) ? ?Target Ranges:  Prepandial:   less than 140 mg/dL ?     Peak postprandial:   less than 180 mg/dL (1-2 hours) ?     Critically ill patients:  140 - 180 mg/dL  ? ?Lab Results  ?Component Value Date  ? GLUCAP 291 (H) 12/07/2021  ? HGBA1C 7.6 (A) 11/27/2021  ? ? ?Review of Glycemic Control ? ?Diabetes history: DM2 ?Outpatient Diabetes medications: Levemir 25 BID, Jardiance 10 mg QD ?Current orders for Inpatient glycemic control: Levemir 16 QHS, Novolog 0-20 units TID with meals and 0-5 HS ? ?HgbA1C - 7.6% ? ?Inpatient Diabetes Program Recommendations:   ? ? ?Spoke with pt at bedside regarding her diabetes and HgbA1C of 7.6%. Pt sees PCP at [facility] and states she will be starting on Ozempic at next visit. Monitors blood sugars a couple of times/day and has occasional lows at night or in early am. Treats with glucose tablets (has tabs at bedside). Pt states she's trying to eat healthier has decreased her HgbA1C from 8.9% (02/08/21) down to 7.6%. Congratulated on improving blood sugar control. Discussed importance of controlling blood sugars to reduce risks of long and short-term complications. Seems very motivated to continue staying on top of her diabetes. Answered questions. ? ?Thank you. ?Lorenda Peck, RD, LDN, CDE ?Inpatient Diabetes Coordinator ?[phone]  ? ? ? ? ? ?

## 2021-12-07 NOTE — Progress Notes (Addendum)
1 Day Post-Op Procedure(s) (LRB): ?XI ROBOTIC ASSISTED TOTAL HYSTERECTOMY WITH BILATERAL SALPINGO OOPHORECTOMY (N/A) ? ?Subjective: ?Patient reports doing well this am. Slept well last night. Feels her breathing is at her baseline. Tolerating diet with no nausea or emesis reported. She states her blood glucose levels always run elevated when in the hospital and when she gets home, they stabilize to her baseline. Voiding without difficulty- reports improvement in mild dysuria after catheter removal. Minimal pain reported. Having light vaginal spotting. Wore SCDs all night, using IS. No concerns voiced. ? ?Objective: ?Vital signs in last 24 hours: ?Temp:  [97.6 ?F (36.4 ?C)-98.8 ?F (37.1 ?C)] 98.1 ?F (36.7 ?C) (04/06 0100) ?Pulse Rate:  [76-94] 81 (04/06 0918) ?Resp:  [12-20] 16 (04/06 7121) ?BP: (136-184)/(48-100) 153/66 (04/06 9758) ?SpO2:  [92 %-100 %] 99 % (04/06 0918) ?Weight:  [294 lb 1.5 oz (133.4 kg)] 294 lb 1.5 oz (133.4 kg) (04/05 1843) ?Last BM Date : 12/04/21 ? ?Intake/Output from previous day: ?04/05 0701 - 04/06 0700 ?In: 3641.9 [P.O.:840; I.V.:2751.9; IV Piggyback:50] ?Out: 4000 [Urine:3850; Blood:150] ? ?Physical Examination: ?General: alert, cooperative, and no distress ?Resp: clear to auscultation bilaterally ?Cardio: regular rate and rhythm, S1, S2 normal, no murmur, click, rub or gallop ?GI: soft, non-tender; bowel sounds normal; no masses,  no organomegaly and incision: lap sites to the abdomen with dermabond intact with no drainage, abdomen obese ?Extremities: extremities normal, atraumatic, no cyanosis or edema ? ?Labs: ?WBC/Hgb/Hct/Plts:  14.8/10.3/33.3/279 (04/06 0401) BUN/Cr/glu/ALT/AST/amyl/lip:  22/0.86/--/--/--/--/-- (04/06 0401) ? ?Assessment: ?61 y.o. s/p Procedure(s): ?XI ROBOTIC ASSISTED TOTAL HYSTERECTOMY WITH BILATERAL SALPINGO OOPHORECTOMY: stable ?Pain:  Pain is well-controlled on PRN medications. ? ?Heme: Hgb 10.3 and Hct 33.3 this am. Appropriate compared to pre-op values and  surgical losses. ? ?CV: BP and HR stable. Continue to monitor with ordered vital signs. ? ?GI:  Tolerating solid food. Antiemetics ordered if needed. ? ?GU: Voiding adequate amounts. Creatinine 0.86 this am.   ? ?FEN: K+ 5.5 this am-IVF discontinued. Plan for repeat Bmet later this am.  ? ?Endo: Diabetes mellitus Type II, under poor control.  CBG: CBG (last 3)  ?Recent Labs  ?  12/06/21 ?1619 12/06/21 ?2124 12/07/21 ?0722  ?GLUCAP 117* 330* 331*  ?Receiving jardiance this am. IVF discontinued. On resistant sliding scale. Given levemir 16 units last pm (pre-op dose 22 units). Given decadron intraoperatively.   ? ?Prophylaxis: SCDs on. Lovenox ordered. ? ?Plan: ?IVF discontinued ?Plan for repeat Bmet later this am to follow up on K+ and glucose level ?If K+ and glucose improving, plan for discharge home if patient needing post-op milestones ?Encourage ambulation, IS use ?Continue plan of care per Dr. Berline Lopes ? ? LOS: 0 days  ? ? ?Brenda [name] ?12/07/2021, 10:37 AM ? ? ? ?   ?

## 2021-12-07 NOTE — Progress Notes (Signed)
Transition of Care (TOC) Screening Note ? ?Patient Details  ?Name: Brenda [name] ?Date of Birth: 05-03-1961 ? ?Transition of Care (TOC) CM/SW Contact:    ?Sherie Don, LCSW ?Phone Number: ?12/07/2021, 10:28 AM ? ?Transition of Care Department [facility]) has reviewed patient and no TOC needs have been identified at this time. We will continue to monitor patient advancement through interdisciplinary progression rounds. If new patient transition needs arise, please place a TOC consult. ?

## 2021-12-07 NOTE — Discharge Summary (Signed)
?Physician Discharge Summary  ?Patient ID: ?Brenda [name] ?MRN: [id number] ?DOB/AGE: [birth year] 59 y.o. ? ?Admit date: 12/06/2021 ?Discharge date: 12/07/2021 ? 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 ?Discharge Diagnoses:  ?Principal Problem: ?  Endometrial adenocarcinoma ([place]) ?Active Problems: ?  Endometrial cancer ([place]) ? ? ?Discharged Condition:  The patient is in good condition and stable for discharge.   ? ?Hospital Course: On 12/06/2021, the patient underwent the following: Procedure(s): XI ROBOTIC ASSISTED TOTAL HYSTERECTOMY WITH BILATERAL SALPINGO-OOPHORECTOMY. Given complexity of case wth comorbities, patient monitored overnight. On morning of POD 1, K+ was 5.5 on CBC with repeat later in the am at 4.7. Glucose was elevated with levels at 334 and 355 in the am on POD1. The Diabetes Coordinator saw the patient. Glucose started trending downward later in the day. Patient planning on checking glucose levels at home closely. She was discharged to home on postoperative day 1 tolerating a regular diet, voiding, pain controlled, ambulating, at respiratory baseline, glucose level improving.   ? ?Consults: Diabetes coordinator ? ?Significant Diagnostic Studies: Labs ? ?Treatments: surgery: see above ? ?Discharge Exam (am assessment by Dr. Berline Lopes): ?Blood pressure (!) 163/52, pulse 91, temperature 98.5 ?F (36.9 ?C), resp. rate 16, height '5\' 2"'$  (1.575 m), weight 294 lb 1.5 oz (133.4 kg), last menstrual period 10/30/2014, SpO2 99 %. ?General appearance: alert, cooperative, and no distress ?Resp: clear to auscultation bilaterally ?Cardio: regular rate and rhythm, S1, S2 normal, no murmur, click, rub or gallop ?GI: soft, non-tender; bowel sounds normal; no masses,  no organomegaly ?Extremities: extremities normal, atraumatic, no cyanosis or edema ?Incision/Wound: Lap sites to the abdomen with dermabond without erythema or drainage. Abd obese. ? ?Disposition:  ?Discharge disposition: 01-Home or Self  Care ? ? ? ? ? ? ?Discharge Instructions   ? ? Call MD for:  difficulty breathing, headache or visual disturbances   Complete by: As directed ?  ? Call MD for:  extreme fatigue   Complete by: As directed ?  ? Call MD for:  hives   Complete by: As directed ?  ? Call MD for:  persistant dizziness or light-headedness   Complete by: As directed ?  ? Call MD for:  persistant nausea and vomiting   Complete by: As directed ?  ? Call MD for:  redness, tenderness, or signs of infection (pain, swelling, redness, odor or green/yellow discharge around incision site)   Complete by: As directed ?  ? Call MD for:  severe uncontrolled pain   Complete by: As directed ?  ? Call MD for:  temperature >100.4   Complete by: As directed ?  ? Diet - low sodium heart healthy   Complete by: As directed ?  ? Driving Restrictions   Complete by: As directed ?  ? No driving for around 1 week.  Do not take narcotics and drive. You need to make sure your reaction time has returned.  ? Increase activity slowly   Complete by: As directed ?  ? Lifting restrictions   Complete by: As directed ?  ? No lifting greater than 10 lbs, pushing, pulling, straining for 6 weeks.  ? Sexual Activity Restrictions   Complete by: As directed ?  ? No sexual activity, nothing in the vagina, for 8 weeks.  ? ?  ? ?Allergies as of 12/07/2021   ? ?   Reactions  ? Hydrocodone Shortness Of Breath  ? "I forget to breathe."   ? Clopidogrel Bisulfate Rash  ? ?  ? ?  ?  Medication List  ?  ? ?STOP taking these medications   ? ?megestrol 40 MG tablet ?Commonly known as: MEGACE ?  ?prasugrel 5 MG Tabs tablet ?Commonly known as: EFFIENT ?  ? ?  ? ?TAKE these medications   ? ?Aspirin Low Dose 81 MG EC tablet ?Generic drug: aspirin ?Take 1 tablet (81 mg total) by mouth daily. ?  ?atorvastatin 80 MG tablet ?Commonly known as: LIPITOR ?Take 1 tablet (80 mg total) by mouth every evening. ?  ?Calcium-Magnesium-Zinc Tabs ?Take 1 tablet by mouth daily. ?  ?empagliflozin 10 MG Tabs  tablet ?Commonly known as: JARDIANCE ?Take 1 tablet (10 mg total) by mouth daily. ?  ?Entresto 24-26 MG ?Generic drug: sacubitril-valsartan ?Take 1 tablet by mouth 2 (two) times daily. ?  ?FLUoxetine 20 MG capsule ?Commonly known as: PROzac ?Take 1 capsule (20 mg total) by mouth daily. ?  ?furosemide 40 MG tablet ?Commonly known as: LASIX ?Take 1 tablet (40 mg total) by mouth daily. ?  ?gabapentin 300 MG capsule ?Commonly known as: NEURONTIN ?Take 2 capsules (600 mg total) by mouth at bedtime. ?  ?glucose blood test strip ?Commonly known as: True Metrix Blood Glucose Test ?Use as instructed ?What changed: when to take this ?  ?Insulin Pen Needle 30G X 8 MM Misc ?Commonly known as: NOVOFINE ?Use as directed with Novolin 70/30 insulin ?  ?Insulin Syringe-Needle U-100 30G X 1/2" 0.3 ML Misc ?use as directed 5 (five) times daily with insulin ?  ?Levemir FlexTouch 100 UNIT/ML FlexPen ?Generic drug: insulin detemir ?Inject 22 Units into the skin 2 (two) times daily. ?What changed: how much to take ?  ?Midol Complete 500-60-15 MG Tabs ?Generic drug: Acetaminophen-Caff-Pyrilamine ?Take 2 tablets by mouth daily as needed (pain/cramps). ?  ?Misc. Devices Misc ?Portable oxygen concentrator, 2L oxygen.  Diagnoses-chronic respiratory failure with hypoxia ?  ?nitroGLYCERIN 0.4 MG SL tablet ?Commonly known as: NITROSTAT ?Place 1 tablet (0.4 mg total) under the tongue every 5 (five) minutes as needed for chest pain (up to 3 doses. If taking 3rd dose call 911). ?  ?Ozempic (0.25 or 0.5 MG/DOSE) 2 MG/1.5ML Sopn ?Generic drug: Semaglutide(0.25 or 0.'5MG'$ /DOS) ?Inject 0.5 mg into the skin once a week. ?  ?senna-docusate 8.6-50 MG tablet ?Commonly known as: Senokot-S ?Take 2 tablets by mouth at bedtime. For AFTER surgery, do not take if having diarrhea ?  ?traMADol 50 MG tablet ?Commonly known as: ULTRAM ?Take 1 tablet (50 mg total) by mouth every 6 (six) hours as needed for severe pain. For AFTER surgery only, do not take and drive ?   ?True Metrix Meter Devi ?1 each by Does not apply route 3 (three) times daily. ?  ?TRUEplus Lancets 30G Misc ?1 each by Does not apply route 3 (three) times daily. ?  ?vitamin B-12 1000 MCG tablet ?Commonly known as: CYANOCOBALAMIN ?Take 1,000 mcg by mouth daily. ?  ? ?  ? ? Follow-up Information   ? ? Lafonda Mosses, MD Follow up on 12/13/2021.   ?Specialty: Gynecologic Oncology ?Why: at 4pm will be a PHONE visit to check in with Dr. Berline Lopes and discuss pathology from surgery. IN PERSON visit will be on 12/25/21 at 2:30pm at the [facility]. ?Contact information: ?[address] ?[place] [address] ?[phone] ? ? ?  ?  ? ?  ?  ? ?  ? ? ?Greater than thirty minutes were spend for face to face discharge instructions and discharge orders/summary in EPIC.  ? ?Signed: ?Achsah Mcquade D Nicholous Girgenti ?12/07/2021,  3:14 PM ? ? ? ?  ?

## 2021-12-07 NOTE — Progress Notes (Signed)
Assessment unchanged. Pt verbalized understanding of dc instructions including medications and follow up care through teach back, Discharged via wc to front entrance accompanied by NT.   ?

## 2021-12-08 ENCOUNTER — Telehealth: Payer: Self-pay | Admitting: *Deleted

## 2021-12-08 ENCOUNTER — Encounter (HOSPITAL_COMMUNITY): Payer: Self-pay | Admitting: Emergency Medicine

## 2021-12-08 ENCOUNTER — Other Ambulatory Visit: Payer: Self-pay

## 2021-12-08 ENCOUNTER — Inpatient Hospital Stay (HOSPITAL_COMMUNITY)
Admission: EM | Admit: 2021-12-08 | Discharge: 2021-12-11 | DRG: 982 | Disposition: A | Discharge status: Home / Self Care | Payer: Medicaid Other | Attending: Internal Medicine | Admitting: Internal Medicine

## 2021-12-08 ENCOUNTER — Emergency Department (HOSPITAL_COMMUNITY): Payer: Medicaid Other

## 2021-12-08 DIAGNOSIS — C541 Malignant neoplasm of endometrium: Secondary | ICD-10-CM | POA: Diagnosis present

## 2021-12-08 DIAGNOSIS — Z825 Family history of asthma and other chronic lower respiratory diseases: Secondary | ICD-10-CM

## 2021-12-08 DIAGNOSIS — F419 Anxiety disorder, unspecified: Secondary | ICD-10-CM | POA: Diagnosis present

## 2021-12-08 DIAGNOSIS — E1140 Type 2 diabetes mellitus with diabetic neuropathy, unspecified: Secondary | ICD-10-CM | POA: Diagnosis present

## 2021-12-08 DIAGNOSIS — K56609 Unspecified intestinal obstruction, unspecified as to partial versus complete obstruction: Principal | ICD-10-CM | POA: Diagnosis present

## 2021-12-08 DIAGNOSIS — I5032 Chronic diastolic (congestive) heart failure: Secondary | ICD-10-CM | POA: Diagnosis present

## 2021-12-08 DIAGNOSIS — Z823 Family history of stroke: Secondary | ICD-10-CM

## 2021-12-08 DIAGNOSIS — Z6841 Body Mass Index (BMI) 40.0 and over, adult: Secondary | ICD-10-CM

## 2021-12-08 DIAGNOSIS — F418 Other specified anxiety disorders: Secondary | ICD-10-CM | POA: Diagnosis present

## 2021-12-08 DIAGNOSIS — Y838 Other surgical procedures as the cause of abnormal reaction of the patient, or of later complication, without mention of misadventure at the time of the procedure: Secondary | ICD-10-CM | POA: Diagnosis present

## 2021-12-08 DIAGNOSIS — I10 Essential (primary) hypertension: Secondary | ICD-10-CM | POA: Diagnosis present

## 2021-12-08 DIAGNOSIS — Z888 Allergy status to other drugs, medicaments and biological substances status: Secondary | ICD-10-CM

## 2021-12-08 DIAGNOSIS — K420 Umbilical hernia with obstruction, without gangrene: Secondary | ICD-10-CM | POA: Diagnosis present

## 2021-12-08 DIAGNOSIS — Z9981 Dependence on supplemental oxygen: Secondary | ICD-10-CM

## 2021-12-08 DIAGNOSIS — F32A Depression, unspecified: Secondary | ICD-10-CM | POA: Diagnosis present

## 2021-12-08 DIAGNOSIS — Z9049 Acquired absence of other specified parts of digestive tract: Secondary | ICD-10-CM

## 2021-12-08 DIAGNOSIS — Z79899 Other long term (current) drug therapy: Secondary | ICD-10-CM

## 2021-12-08 DIAGNOSIS — Z8249 Family history of ischemic heart disease and other diseases of the circulatory system: Secondary | ICD-10-CM

## 2021-12-08 DIAGNOSIS — I50810 Right heart failure, unspecified: Secondary | ICD-10-CM | POA: Diagnosis present

## 2021-12-08 DIAGNOSIS — Z90710 Acquired absence of both cervix and uterus: Secondary | ICD-10-CM

## 2021-12-08 DIAGNOSIS — I2510 Atherosclerotic heart disease of native coronary artery without angina pectoris: Secondary | ICD-10-CM

## 2021-12-08 DIAGNOSIS — Z833 Family history of diabetes mellitus: Secondary | ICD-10-CM

## 2021-12-08 DIAGNOSIS — E7800 Pure hypercholesterolemia, unspecified: Secondary | ICD-10-CM | POA: Diagnosis present

## 2021-12-08 DIAGNOSIS — Z809 Family history of malignant neoplasm, unspecified: Secondary | ICD-10-CM

## 2021-12-08 DIAGNOSIS — I110 Hypertensive heart disease with heart failure: Secondary | ICD-10-CM | POA: Diagnosis present

## 2021-12-08 DIAGNOSIS — D72829 Elevated white blood cell count, unspecified: Secondary | ICD-10-CM | POA: Diagnosis present

## 2021-12-08 DIAGNOSIS — N9989 Other postprocedural complications and disorders of genitourinary system: Principal | ICD-10-CM | POA: Diagnosis present

## 2021-12-08 DIAGNOSIS — E118 Type 2 diabetes mellitus with unspecified complications: Secondary | ICD-10-CM

## 2021-12-08 DIAGNOSIS — E785 Hyperlipidemia, unspecified: Secondary | ICD-10-CM | POA: Diagnosis present

## 2021-12-08 DIAGNOSIS — Z90722 Acquired absence of ovaries, bilateral: Secondary | ICD-10-CM

## 2021-12-08 DIAGNOSIS — E1169 Type 2 diabetes mellitus with other specified complication: Secondary | ICD-10-CM | POA: Diagnosis present

## 2021-12-08 DIAGNOSIS — Z794 Long term (current) use of insulin: Secondary | ICD-10-CM

## 2021-12-08 DIAGNOSIS — J9610 Chronic respiratory failure, unspecified whether with hypoxia or hypercapnia: Secondary | ICD-10-CM | POA: Diagnosis present

## 2021-12-08 DIAGNOSIS — Z7982 Long term (current) use of aspirin: Secondary | ICD-10-CM

## 2021-12-08 DIAGNOSIS — Z885 Allergy status to narcotic agent status: Secondary | ICD-10-CM

## 2021-12-08 DIAGNOSIS — J9611 Chronic respiratory failure with hypoxia: Secondary | ICD-10-CM | POA: Diagnosis present

## 2021-12-08 DIAGNOSIS — Z87891 Personal history of nicotine dependence: Secondary | ICD-10-CM

## 2021-12-08 DIAGNOSIS — I252 Old myocardial infarction: Secondary | ICD-10-CM

## 2021-12-08 DIAGNOSIS — M1990 Unspecified osteoarthritis, unspecified site: Secondary | ICD-10-CM | POA: Diagnosis present

## 2021-12-08 DIAGNOSIS — G4733 Obstructive sleep apnea (adult) (pediatric): Secondary | ICD-10-CM | POA: Diagnosis present

## 2021-12-08 DIAGNOSIS — Z8542 Personal history of malignant neoplasm of other parts of uterus: Secondary | ICD-10-CM

## 2021-12-08 DIAGNOSIS — Z955 Presence of coronary angioplasty implant and graft: Secondary | ICD-10-CM

## 2021-12-08 DIAGNOSIS — I5030 Unspecified diastolic (congestive) heart failure: Secondary | ICD-10-CM | POA: Diagnosis present

## 2021-12-08 LAB — CBC WITH DIFFERENTIAL/PLATELET
Abs Immature Granulocytes: 0.07 10*3/uL (ref 0.00–0.07)
Basophils Absolute: 0 10*3/uL (ref 0.0–0.1)
Basophils Relative: 0 %
Eosinophils Absolute: 0.2 10*3/uL (ref 0.0–0.5)
Eosinophils Relative: 1 %
HCT: 40 % (ref 36.0–46.0)
Hemoglobin: 12.5 g/dL (ref 12.0–15.0)
Immature Granulocytes: 1 %
Lymphocytes Relative: 10 %
Lymphs Abs: 1.5 10*3/uL (ref 0.7–4.0)
MCH: 26.3 pg (ref 26.0–34.0)
MCHC: 31.3 g/dL (ref 30.0–36.0)
MCV: 84.2 fL (ref 80.0–100.0)
Monocytes Absolute: 0.9 10*3/uL (ref 0.1–1.0)
Monocytes Relative: 6 %
Neutro Abs: 12.2 10*3/uL — ABNORMAL HIGH (ref 1.7–7.7)
Neutrophils Relative %: 82 %
Platelets: 350 10*3/uL (ref 150–400)
RBC: 4.75 MIL/uL (ref 3.87–5.11)
RDW: 16.7 % — ABNORMAL HIGH (ref 11.5–15.5)
WBC: 14.8 10*3/uL — ABNORMAL HIGH (ref 4.0–10.5)
nRBC: 0 % (ref 0.0–0.2)

## 2021-12-08 LAB — COMPREHENSIVE METABOLIC PANEL
ALT: 16 U/L (ref 0–44)
AST: 32 U/L (ref 15–41)
Albumin: 3.7 g/dL (ref 3.5–5.0)
Alkaline Phosphatase: 71 U/L (ref 38–126)
Anion gap: 9 (ref 5–15)
BUN: 18 mg/dL (ref 6–20)
CO2: 24 mmol/L (ref 22–32)
Calcium: 8.9 mg/dL (ref 8.9–10.3)
Chloride: 105 mmol/L (ref 98–111)
Creatinine, Ser: 0.93 mg/dL (ref 0.44–1.00)
GFR, Estimated: >60 mL/min (ref >60)
Glucose, Bld: 182 mg/dL — ABNORMAL HIGH (ref 70–99)
Potassium: 4.4 mmol/L (ref 3.5–5.1)
Sodium: 138 mmol/L (ref 135–145)
Total Bilirubin: 1.3 mg/dL — ABNORMAL HIGH (ref 0.3–1.2)
Total Protein: 8 g/dL (ref 6.5–8.1)

## 2021-12-08 LAB — GLUCOSE, CAPILLARY: Glucose-Capillary: 136 mg/dL — ABNORMAL HIGH (ref 70–99)

## 2021-12-08 LAB — LIPASE, BLOOD: Lipase: 22 U/L (ref 11–51)

## 2021-12-08 LAB — LACTIC ACID, PLASMA: Lactic Acid, Venous: 1.4 mmol/L (ref 0.5–1.9)

## 2021-12-08 MED ORDER — EMPAGLIFLOZIN 10 MG PO TABS
10.0000 mg | ORAL_TABLET | Freq: Every day | ORAL | Status: DC
  Administered 2021-12-10 – 2021-12-11 (×2): 10 mg via ORAL
  Filled 2021-12-08 (×3): qty 1

## 2021-12-08 MED ORDER — GABAPENTIN 300 MG PO CAPS
600.0000 mg | ORAL_CAPSULE | Freq: Every day | ORAL | Status: DC
  Administered 2021-12-09 – 2021-12-10 (×2): 600 mg via ORAL
  Filled 2021-12-08 (×2): qty 2

## 2021-12-08 MED ORDER — SODIUM CHLORIDE 0.9 % IV SOLN: INTRAVENOUS | Status: AC

## 2021-12-08 MED ORDER — NITROGLYCERIN 0.4 MG SL SUBL: 0.4000 mg | SUBLINGUAL_TABLET | SUBLINGUAL | Status: DC | PRN

## 2021-12-08 MED ORDER — ASPIRIN EC 81 MG PO TBEC
81.0000 mg | DELAYED_RELEASE_TABLET | Freq: Every day | ORAL | Status: DC
  Administered 2021-12-09 – 2021-12-11 (×3): 81 mg via ORAL
  Filled 2021-12-08 (×3): qty 1

## 2021-12-08 MED ORDER — SACUBITRIL-VALSARTAN 24-26 MG PO TABS
1.0000 | ORAL_TABLET | Freq: Two times a day (BID) | ORAL | Status: DC
  Administered 2021-12-09 – 2021-12-11 (×4): 1 via ORAL
  Filled 2021-12-08 (×5): qty 1

## 2021-12-08 MED ORDER — FENTANYL CITRATE PF 50 MCG/ML IJ SOSY
50.0000 ug | PREFILLED_SYRINGE | Freq: Once | INTRAMUSCULAR | Status: AC
  Administered 2021-12-08: 50 ug via INTRAVENOUS
  Filled 2021-12-08: qty 1

## 2021-12-08 MED ORDER — INSULIN GLARGINE-YFGN 100 UNIT/ML ~~LOC~~ SOLN
12.0000 [IU] | Freq: Two times a day (BID) | SUBCUTANEOUS | Status: DC
  Administered 2021-12-08 – 2021-12-11 (×5): 12 [IU] via SUBCUTANEOUS
  Filled 2021-12-08 (×7): qty 0.12

## 2021-12-08 MED ORDER — ONDANSETRON HCL 4 MG/2ML IJ SOLN
4.0000 mg | Freq: Once | INTRAMUSCULAR | Status: AC
Start: 2021-12-08 — End: 2021-12-08
  Administered 2021-12-08: 4 mg via INTRAVENOUS
  Filled 2021-12-08: qty 2

## 2021-12-08 MED ORDER — FENTANYL CITRATE PF 50 MCG/ML IJ SOSY
50.0000 ug | PREFILLED_SYRINGE | Freq: Once | INTRAMUSCULAR | Status: AC
Start: 2021-12-08 — End: 2021-12-08
  Administered 2021-12-08: 50 ug via INTRAVENOUS
  Filled 2021-12-08: qty 1

## 2021-12-08 MED ORDER — IOHEXOL 300 MG/ML  SOLN
100.0000 mL | Freq: Once | INTRAMUSCULAR | Status: AC | PRN
  Administered 2021-12-08: 100 mL via INTRAVENOUS

## 2021-12-08 MED ORDER — SODIUM CHLORIDE 0.9% FLUSH
3.0000 mL | Freq: Two times a day (BID) | INTRAVENOUS | Status: DC
  Administered 2021-12-08 – 2021-12-11 (×6): 3 mL via INTRAVENOUS

## 2021-12-08 MED ORDER — SODIUM CHLORIDE 0.9 % IV BOLUS
1000.0000 mL | Freq: Once | INTRAVENOUS | Status: AC
Start: 2021-12-08 — End: 2021-12-08
  Administered 2021-12-08: 1000 mL via INTRAVENOUS

## 2021-12-08 MED ORDER — FUROSEMIDE 40 MG PO TABS
40.0000 mg | ORAL_TABLET | Freq: Every day | ORAL | Status: DC
  Administered 2021-12-09 – 2021-12-11 (×3): 40 mg via ORAL
  Filled 2021-12-08 (×3): qty 1

## 2021-12-08 MED ORDER — INSULIN ASPART 100 UNIT/ML IJ SOLN
0.0000 [IU] | INTRAMUSCULAR | Status: DC
  Administered 2021-12-08: 2 [IU] via SUBCUTANEOUS
  Administered 2021-12-09: 3 [IU] via SUBCUTANEOUS
  Administered 2021-12-09 – 2021-12-10 (×2): 5 [IU] via SUBCUTANEOUS
  Administered 2021-12-10: 2 [IU] via SUBCUTANEOUS
  Administered 2021-12-10: 5 [IU] via SUBCUTANEOUS
  Administered 2021-12-10: 2 [IU] via SUBCUTANEOUS
  Administered 2021-12-10: 8 [IU] via SUBCUTANEOUS
  Administered 2021-12-11: 2 [IU] via SUBCUTANEOUS
  Administered 2021-12-11 (×2): 3 [IU] via SUBCUTANEOUS
  Filled 2021-12-08: qty 0.15

## 2021-12-08 MED ORDER — HYDROMORPHONE HCL 1 MG/ML IJ SOLN
0.5000 mg | INTRAMUSCULAR | Status: DC | PRN
  Administered 2021-12-09 (×2): 1 mg via INTRAVENOUS
  Filled 2021-12-08 (×2): qty 1

## 2021-12-08 MED ORDER — ATORVASTATIN CALCIUM 40 MG PO TABS
80.0000 mg | ORAL_TABLET | Freq: Every evening | ORAL | Status: DC
  Administered 2021-12-09 – 2021-12-10 (×2): 80 mg via ORAL
  Filled 2021-12-08 (×2): qty 2

## 2021-12-08 MED ORDER — PRASUGREL HCL 5 MG PO TABS
5.0000 mg | ORAL_TABLET | Freq: Every day | ORAL | 6 refills | Status: DC
  Filled 2021-12-08: qty 30, 30d supply, fill #0
  Filled 2022-01-08: qty 30, 30d supply, fill #1
  Filled 2022-02-11: qty 30, 30d supply, fill #2
  Filled 2022-03-11: qty 30, 30d supply, fill #3
  Filled 2022-04-13: qty 30, 30d supply, fill #4

## 2021-12-08 MED ORDER — SODIUM CHLORIDE (PF) 0.9 % IJ SOLN
INTRAMUSCULAR | Status: AC
  Filled 2021-12-08: qty 50

## 2021-12-08 MED ORDER — SODIUM CHLORIDE 0.9 % IV SOLN
Freq: Once | INTRAVENOUS | Status: DC
Start: 2021-12-08 — End: 2021-12-08

## 2021-12-08 MED ORDER — FLUOXETINE HCL 20 MG PO CAPS
20.0000 mg | ORAL_CAPSULE | Freq: Every day | ORAL | Status: DC
  Administered 2021-12-09 – 2021-12-11 (×3): 20 mg via ORAL
  Filled 2021-12-08 (×3): qty 1

## 2021-12-08 MED ORDER — LIDOCAINE HCL URETHRAL/MUCOSAL 2 % EX GEL
1.0000 | Freq: Once | CUTANEOUS | Status: AC
Start: 2021-12-08 — End: 2021-12-08
  Administered 2021-12-08: 1
  Filled 2021-12-08: qty 11

## 2021-12-08 NOTE — Telephone Encounter (Signed)
[name] sister Xian Apostol called in stating that pt is experiencing severe abdominal pain and vomiting. Spoke with pt on the phone who stated that her pain is a 10/10 and not being controlled by pain medications this afternoon. She has thrown up once, had a very small bowel movement, passing a little bit of gas but burping more than anything. She is taking the senna as prescribed.Her blood sugar is 156. Her abdomen is a little distended and slightly firm. Per Joylene John, NP pt needs to go to the emergency room to get evaluated. Pt verbalized understanding.  ?

## 2021-12-08 NOTE — ED Provider Notes (Addendum)
[facility]-EMERGENCY DEPT Provider Note   CSN: [id number] Arrival date & time: 12/08/21  1817     History  Chief Complaint  Patient presents with   Abdominal Pain    Brenda [name] is a 61 y.o. female.  Patient had hysterectomy several days ago for endometrial cancer treatment.  Still waiting on pathology to see if there is any metastasis.  History of heart failure, CAD, sleep apnea, diabetes on insulin.  Chronically on 2 L of oxygen.  She has not had bowel movement and now not passing gas anymore.  She is thrown up multiple times and unable to keep down even liquids.  When she left the hospital she states that she was able to eat and drink well but over the last 2 days she has not done well.  Abdomen feels distended to her but she denies any fevers or chills.  Has not had any irregular bleeding.  Denies any chest pain, shortness of breath.  The history is provided by the patient.  Abdominal Pain     Home Medications Prior to Admission medications   Medication Sig Start Date End Date Taking? Authorizing Provider  Acetaminophen-Caff-Pyrilamine (MIDOL COMPLETE) 500-60-15 MG TABS Take 2 tablets by mouth daily as needed (pain/cramps).    [provider]  aspirin 81 MG EC tablet Take 1 tablet (81 mg total) by mouth daily. 02/14/21   Theotis Barrio, MD  atorvastatin (LIPITOR) 80 MG tablet Take 1 tablet (80 mg total) by mouth every evening. 10/03/21   Hoy Register, MD  Blood Glucose Monitoring Suppl (TRUE METRIX METER) DEVI 1 each by Does not apply route 3 (three) times daily. 02/28/15   Hoy Register, MD  empagliflozin (JARDIANCE) 10 MG TABS tablet Take 1 tablet (10 mg total) by mouth daily. 10/03/21   Hoy Register, MD  FLUoxetine (PROZAC) 20 MG capsule Take 1 capsule (20 mg total) by mouth daily. 10/03/21   Hoy Register, MD  furosemide (LASIX) 40 MG tablet Take 1 tablet (40 mg total) by mouth daily. 06/26/21   Hoy Register, MD  gabapentin  (NEURONTIN) 300 MG capsule Take 2 capsules (600 mg total) by mouth at bedtime. 11/01/21   Hoy Register, MD  glucose blood (TRUE METRIX BLOOD GLUCOSE TEST) test strip Use as instructed Patient taking differently: 3 (three) times daily. Use as instructed 02/28/15   Hoy Register, MD  insulin detemir (LEVEMIR FLEXPEN) 100 UNIT/ML FlexPen Inject 22 Units into the skin 2 (two) times daily. 12/07/21   Hoy Register, MD  Insulin Pen Needle (NOVOFINE) 30G X 8 MM MISC Use as directed with Novolin 70/30 insulin Patient taking differently: Use as directed with Novolin 70/30 insulin 10/28/19   Dunn, Dayna N, PA-C  Insulin Syringe-Needle U-100 30G X 1/2" 0.3 ML MISC use as directed 5 (five) times daily with insulin 02/14/21   Karsten Ro, MD  Misc. Devices MISC Portable oxygen concentrator, 2L oxygen.  Diagnoses-chronic respiratory failure with hypoxia 11/27/21   Hoy Register, MD  Multiple Minerals (CALCIUM-MAGNESIUM-ZINC) TABS Take 1 tablet by mouth daily.    [provider]  nitroGLYCERIN (NITROSTAT) 0.4 MG SL tablet Place 1 tablet (0.4 mg total) under the tongue every 5 (five) minutes as needed for chest pain (up to 3 doses. If taking 3rd dose call 911). 03/22/21   Hoy Register, MD  prasugrel (EFFIENT) 5 MG TABS tablet Take 1 tablet (5 mg total) by mouth daily. 12/08/21 12/08/22  Hoy Register, MD  sacubitril-valsartan (ENTRESTO) 24-26 MG Take 1  tablet by mouth 2 (two) times daily. 11/22/21   Alver Sorrow, NP  Semaglutide,0.25 or 0.5MG /DOS, (OZEMPIC, 0.25 OR 0.5 MG/DOSE,) 2 MG/1.5ML SOPN Inject 0.5 mg into the skin once a week. Patient not taking: Reported on 11/27/2021 11/27/21   Hoy Register, MD  senna-docusate (SENOKOT-S) 8.6-50 MG tablet Take 2 tablets by mouth at bedtime. For AFTER surgery, do not take if having diarrhea 12/06/21   Warner Mccreedy D, NP  traMADol (ULTRAM) 50 MG tablet Take 1 tablet (50 mg total) by mouth every 6 (six) hours as needed for severe pain. For AFTER surgery only,  do not take and drive 01/08/83   Cross, Efraim Kaufmann D, NP  TRUEPLUS LANCETS 30G MISC 1 each by Does not apply route 3 (three) times daily. 02/28/15   Hoy Register, MD  vitamin B-12 (CYANOCOBALAMIN) 1000 MCG tablet Take 1,000 mcg by mouth daily.    [provider]      Allergies    Hydrocodone and Clopidogrel bisulfate    Review of Systems   Review of Systems  Gastrointestinal:  Positive for abdominal pain.   Physical Exam Updated Vital Signs BP (!) 123/96   Pulse 72   Temp 98.2 F (36.8 C)   Resp 17   Ht 5\' 2"  (1.575 m)   Wt 133.4 kg   LMP 10/30/2014 (Approximate)   SpO2 100%   BMI 53.79 kg/m  Physical Exam Vitals and nursing note reviewed.  Constitutional:      General: She is not in acute distress.    Appearance: She is well-developed.  HENT:     Head: Normocephalic and atraumatic.  Eyes:     Extraocular Movements: Extraocular movements intact.     Conjunctiva/sclera: Conjunctivae normal.     Pupils: Pupils are equal, round, and reactive to light.  Cardiovascular:     Rate and Rhythm: Normal rate and regular rhythm.     Heart sounds: Normal heart sounds. No murmur heard. Pulmonary:     Effort: Pulmonary effort is normal. No respiratory distress.     Breath sounds: Normal breath sounds.  Abdominal:     General: There is distension.     Palpations: Abdomen is soft.     Tenderness: There is generalized abdominal tenderness.     Comments: Laparoscopy sites appear clean, dry, intact, indurated skin in the right lower abdomen but no obvious erythema, purulent drainage, there is some mild distention of the abdomen  Musculoskeletal:        General: No swelling.     Cervical back: Neck supple.  Skin:    General: Skin is warm and dry.     Capillary Refill: Capillary refill takes less than 2 seconds.  Neurological:     Mental Status: She is alert.  Psychiatric:        Mood and Affect: Mood normal.    ED Results / Procedures / Treatments   Labs (all labs  ordered are listed, but only abnormal results are displayed) Labs Reviewed  CBC WITH DIFFERENTIAL/PLATELET - Abnormal; Notable for the following components:      Result Value   WBC 14.8 (*)    RDW 16.7 (*)    Neutro Abs 12.2 (*)    All other components within normal limits  COMPREHENSIVE METABOLIC PANEL - Abnormal; Notable for the following components:   Glucose, Bld 182 (*)    Total Bilirubin 1.3 (*)    All other components within normal limits  LIPASE, BLOOD  URINALYSIS, ROUTINE W REFLEX MICROSCOPIC  LACTIC ACID, PLASMA  LACTIC ACID, PLASMA    EKG None  Radiology CT ABDOMEN PELVIS W CONTRAST  Result Date: 12/08/2021 CLINICAL DATA:  Abdominal pain following robotic assisted laparoscopic total hysterectomy 2 days ago. EXAM: CT ABDOMEN AND PELVIS WITH CONTRAST TECHNIQUE: Multidetector CT imaging of the abdomen and pelvis was performed using the standard protocol following bolus administration of intravenous contrast. RADIATION DOSE REDUCTION: This exam was performed according to the departmental dose-optimization program which includes automated exposure control, adjustment of the mA and/or kV according to patient size and/or use of iterative reconstruction technique. CONTRAST:  OMNIPAQUE IOHEXOL 300 MG/ML  SOLN COMPARISON:  CT abdomen and pelvis 11/21/2021. FINDINGS: Lower chest: The lung bases are clear without focal nodule, mass, or airspace disease. Heart is mildly enlarged. Coronary artery calcifications are present. No significant pleural or pericardial effusion is present. Hepatobiliary: No focal liver abnormality is seen. Status post cholecystectomy. No biliary dilatation. Pancreas: Unremarkable. No pancreatic ductal dilatation or surrounding inflammatory changes. Spleen: Normal in size without focal abnormality. Adrenals/Urinary Tract: Adrenal glands are unremarkable. Kidneys are normal, without renal calculi, focal lesion, or hydronephrosis. Bladder is unremarkable.  Stomach/Bowel: Stomach is within normal limits. Duodenal diverticulum again noted without focal inflammation. Proximal small bowel is dilated with fluid levels. Transition point noted at the umbilical hernia which now contains small bowel. The more distal small bowel is collapsed. Ascending transverse colon are within normal limits. Descending and sigmoid colon are mostly collapsed. Vascular/Lymphatic: No significant vascular findings are present. No enlarged abdominal or pelvic lymph nodes. Reproductive: Status post hysterectomy and bilateral salpingo oophorectomy. Other: Gas is now present within the paraumbilical hernia, likely postop. No other significant intraperitoneal gas is present following surgery. The extent of fat within the paraumbilical hernia is stable with the new addition of the incarcerated bowel. Extensive stranding is present in the soft tissues surrounding the paraumbilical hernia. No new ventral hernias are present. Musculoskeletal: No acute or significant osseous findings. IMPRESSION: 1. High-grade small bowel obstruction secondary incarcerated bowel within the previously noted paraumbilical hernia. The loop of bowel in the hernia is new in the source of obstruction. 2. Status post hysterectomy and bilateral salpingo oophorectomy. 3. Coronary artery disease. These results were called by telephone at the time of interpretation on 12/08/2021 at 7:55 pm to provider Jamario Colina , who verbally acknowledged these results. Electronically Signed   By: Marin Roberts M.D.   On: 12/08/2021 20:03    Procedures Procedures    Medications Ordered in ED Medications  sodium chloride (PF) 0.9 % injection (has no administration in time range)  0.9 %  sodium chloride infusion (has no administration in time range)  lidocaine (XYLOCAINE) 2 % jelly 1 application. (has no administration in time range)  sodium chloride 0.9 % bolus 1,000 mL (0 mLs Intravenous Stopped 12/08/21 1936)  fentaNYL  (SUBLIMAZE) injection 50 mcg (50 mcg Intravenous Given 12/08/21 1857)  ondansetron (ZOFRAN) injection 4 mg (4 mg Intravenous Given 12/08/21 1857)  iohexol (OMNIPAQUE) 300 MG/ML solution 100 mL (100 mLs Intravenous Contrast Given 12/08/21 1937)    ED Course/ Medical Decision Making/ A&P                           Medical Decision Making Amount and/or Complexity of Data Reviewed Labs: ordered. Radiology: ordered.  Risk Prescription drug management. Decision regarding hospitalization.   Montinique Czarny is here with abdominal pain.  Status post hysterectomy 2 days ago for endometrial cancer.  Having difficulty tolerating p.o.  Not passing gas, not having bowel movements.  Feels like her belly is distended.  She has a history of diabetes on insulin, history of CAD on 2 L of oxygen.  She has been taking Senokot and tramadol and Tylenol with not much improvement.  She has been up walking around.  She does not appear to have any infection of her surgical site.  There is no purulent drainage or erythema.  Abdomen is slightly distended.  There is some induration of the skin in the right lower abdomen but she states that is from likely her insulin shots that she takes in that area.  Overall differential diagnosis is postop issue versus ileus versus less likely bowel obstruction.  She states that she has been urinating without any issues.  We will obtain a CT scan of the abdomen pelvis, CBC, CMP, lipase, urinalysis.  Will give IV fluid bolus, IV fentanyl, IV Zofran.    No significant leukocytosis, anemia, electrolyte abnormality per my review and interpretation of labs.  Radiology called me on the phone to discuss CT scan.  Appears to have high-grade small bowel obstruction and previous area of periumbilical hernia that was only fat-containing.  This appears to be incarcerated.  Extensive stranding is present in the soft tissues around the periumbilical hernia.  Talked with Dr. Andrey Campanile with general surgery who  recommends NG tube and will evaluate the patient.  Recommends admission to medicine.  Recommends letting Gyn/onc team know.  I have tried to reach out to Dr. Chestine Spore who is on-call for gynecology oncology but there is not a phone number for her directly amion.  Will admit to hospitalist given her multiple medical comorbidities.  I was able to talk with Dr. Chestine Spore. She is aware that the patient is being admitted.  She will talk with surgical team, Dr. Andrey Campanile as well.  This chart was dictated using voice recognition software.  Despite best efforts to proofread,  errors can occur which can change the documentation meaning.    Final Clinical Impression(s) / ED Diagnoses Final diagnoses:  SBO (small bowel obstruction) [facility])    Rx / DC Orders ED Discharge Orders     None         Virgina Norfolk, DO 12/08/21 2046    Virgina Norfolk, DO 12/08/21 2109

## 2021-12-08 NOTE — ED Triage Notes (Signed)
Patient c/o abdominal pain with N/V today. Hysterectomy on 4/5. ?

## 2021-12-08 NOTE — ED Notes (Signed)
ED TO INPATIENT HANDOFF REPORT ? ?Name/Age/Gender ?Brenda [name] ?61 y.o. ?female ? ?Code Status ? ?  ?Code Status Orders  ?(From admission, onward)  ?  ? ? ?  ? ?  Start     Ordered  ? 12/08/21 2142  Full code  Continuous       ? 12/08/21 2144  ? ?  ?  ? ?  ? ?Code Status History   ? ? Date Active Date Inactive Code Status Order ID Comments User Context  ? 12/06/2021 1739 12/07/2021 2030 Full Code [id number]  Lahoma Crocker, MD Inpatient  ? 10/19/2021 0133 10/21/2021 1912 Partial Code [id number]  Eppie Gibson, MD ED  ? 10/19/2021 0034 10/19/2021 0133 DNR [id number]  Lyndee Hensen, DO ED  ? 02/08/2021 1140 02/14/2021 2248 Full Code [id number]  Mosetta Anis, MD ED  ? 10/26/2019 1252 10/28/2019 1949 Full Code [id number]  Burnell Blanks, MD Inpatient  ? 02/22/2015 1531 02/24/2015 1535 Full Code [id number]  Debbe Odea, MD ED  ? ?  ? ?Advance Directive Documentation   ? ?Flowsheet Row Most Recent Value  ?Type of Advance Directive Living will  ?Pre-existing out of facility DNR order (yellow form or pink MOST form) --  ?"MOST" Form in Place? --  ? ?  ? ? ?Home/SNF/Other ?Home ? ?Chief Complaint ?SBO (small bowel obstruction) ([place]) [K56.609] ? ?Level of Care/Admitting Diagnosis ?ED Disposition   ? ? ED Disposition  ?Admit  ? Condition  ?--  ? Comment  ?Hospital Area: [facility] [[id number]] ? Level of Care: Telemetry [5] ? Admit to tele based on following criteria: Other see comments ? Comments: close monitoring, high-grade SBO ? May place patient in observation at [facility] or [place] if equivalent level of care is available:: No ? Covid Evaluation: Asymptomatic - no recent exposure (last 10 days) testing not required ? Diagnosis: SBO (small bowel obstruction) ([place]) [[id number]] ? Admitting Physician: Marcelyn Bruins [[id number]] ? Attending Physician: Marcelyn Bruins [[id number]] ?  ?  ? ?  ? ? ?Medical History ?Past Medical History:  ?Diagnosis Date  ? Anemia   ? Arthritis   ? "back, hands,  hips" (02/22/2015)  ? CAD (coronary artery disease)   ? a. complex LAD/diagonal bifurcation PCI in 2010. b. STEMI 10/2019 s/p PTCA/DES x1 to mLAD overlapping the old stent, residual disease treated medicaly.  ? Cancer [facility])   ? uterine  ? CHF (congestive heart failure) ([place])   ? Depression   ? Diabetes mellitus ([place])   ? "started when I was pregnant; not sure if it was type 1 or type 2"   ? Hypercholesterolemia   ? Hypertension   ? Morbid obesity ([place])   ? 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 Neuromuscular disorder ([place])   ? neuropathy feet  ? Sleep apnea   ? cpap/  ? ? ?Allergies ?Allergies  ?Allergen Reactions  ? Hydrocodone Shortness Of Breath  ?  "I forget to breathe."   ? Clopidogrel Bisulfate Rash  ? ? ?IV Location/Drains/Wounds ?Patient Lines/Drains/Airways Status   ? ? Active Line/Drains/Airways   ? ? Name Placement date Placement time Site Days  ? Peripheral IV 12/08/21 20 G Left Antecubital 12/08/21  1852  Antecubital  less than 1  ? Incision - 5 Ports Abdomen 1: Right;Lateral;Distal 2: Right;Lateral 3: Umbilicus;Upper 4: Left;Lateral 5: Left;Lateral;Distal 12/06/21  1329  -- 2  ? Wound / Incision (Open or Dehisced) 02/12/21 Low transverse cesarean  section Anterior 02/12/21  0019  LTCS  299  ? ?  ?  ? ?  ? ? ?Labs/Imaging ?Results for orders placed or performed during the hospital encounter of 12/08/21 (from the past 48 hour(s))  ?CBC with Differential     Status: Abnormal  ? Collection Time: 12/08/21  6:36 PM  ?Result Value Ref Range  ? WBC 14.8 (H) 4.0 - 10.5 K/uL  ? RBC 4.75 3.87 - 5.11 MIL/uL  ? Hemoglobin 12.5 12.0 - 15.0 g/dL  ? HCT 40.0 36.0 - 46.0 %  ? MCV 84.2 80.0 - 100.0 fL  ? MCH 26.3 26.0 - 34.0 pg  ? MCHC 31.3 30.0 - 36.0 g/dL  ? RDW 16.7 (H) 11.5 - 15.5 %  ? Platelets 350 150 - 400 K/uL  ? nRBC 0.0 0.0 - 0.2 %  ? Neutrophils Relative % 82 %  ? Neutro Abs 12.2 (H) 1.7 - 7.7 K/uL  ? Lymphocytes Relative 10 %  ? Lymphs Abs 1.5 0.7 - 4.0 K/uL  ? Monocytes Relative 6 %  ? Monocytes Absolute  0.9 0.1 - 1.0 K/uL  ? Eosinophils Relative 1 %  ? Eosinophils Absolute 0.2 0.0 - 0.5 K/uL  ? Basophils Relative 0 %  ? Basophils Absolute 0.0 0.0 - 0.1 K/uL  ? Immature Granulocytes 1 %  ? Abs Immature Granulocytes 0.07 0.00 - 0.07 K/uL  ?  Comment: Performed at [facility], [place] [address], [place], [place] [address]  ?Comprehensive metabolic panel     Status: Abnormal  ? Collection Time: 12/08/21  6:36 PM  ?Result Value Ref Range  ? Sodium 138 135 - 145 mmol/L  ? Potassium 4.4 3.5 - 5.1 mmol/L  ? Chloride 105 98 - 111 mmol/L  ? CO2 24 22 - 32 mmol/L  ? Glucose, Bld 182 (H) 70 - 99 mg/dL  ?  Comment: Glucose reference range applies only to samples taken after fasting for at least 8 hours.  ? BUN 18 6 - 20 mg/dL  ? Creatinine, Ser 0.93 0.44 - 1.00 mg/dL  ? Calcium 8.9 8.9 - 10.3 mg/dL  ? Total Protein 8.0 6.5 - 8.1 g/dL  ? Albumin 3.7 3.5 - 5.0 g/dL  ? AST 32 15 - 41 U/L  ? ALT 16 0 - 44 U/L  ? Alkaline Phosphatase 71 38 - 126 U/L  ? Total Bilirubin 1.3 (H) 0.3 - 1.2 mg/dL  ? GFR, Estimated >60 >60 mL/min  ?  Comment: (NOTE) ?Calculated using the CKD-EPI Creatinine Equation (2021) ?  ? Anion gap 9 5 - 15  ?  Comment: Performed at [facility], [place] [address], [place], [place] [address]  ?Lipase, blood     Status: None  ? Collection Time: 12/08/21  6:36 PM  ?Result Value Ref Range  ? Lipase 22 11 - 51 U/L  ?  Comment: Performed at [facility], [place] [address], [place], [place] [address]  ?Lactic acid, plasma     Status: None  ? Collection Time: 12/08/21  8:27 PM  ?Result Value Ref Range  ? Lactic Acid, Venous 1.4 0.5 - 1.9 mmol/L  ?  Comment: Performed at [facility], [place] [address] [place], [place] [address]  ? ?CT ABDOMEN PELVIS W CONTRAST ? ?Result Date: 12/08/2021 ?CLINICAL DATA:  Abdominal pain following robotic assisted laparoscopic total hysterectomy 2 days ago. EXAM: CT ABDOMEN AND PELVIS WITH CONTRAST TECHNIQUE: Multidetector CT  imaging of the abdomen and pelvis was  performed using the standard protocol following bolus administration of intravenous contrast. RADIATION DOSE REDUCTION: This exam was performed according to the departmental dose-optimization program which includes automated exposure control, adjustment of the mA and/or kV according to patient size and/or use of iterative reconstruction technique. CONTRAST:  135m OMNIPAQUE IOHEXOL 300 MG/ML  SOLN COMPARISON:  CT abdomen and pelvis 11/21/2021. FINDINGS: Lower chest: The lung bases are clear without focal nodule, mass, or airspace disease. Heart is mildly enlarged. Coronary artery calcifications are present. No significant pleural or pericardial effusion is present. Hepatobiliary: No focal liver abnormality is seen. Status post cholecystectomy. No biliary dilatation. Pancreas: Unremarkable. No pancreatic ductal dilatation or surrounding inflammatory changes. Spleen: Normal in size without focal abnormality. Adrenals/Urinary Tract: Adrenal glands are unremarkable. Kidneys are normal, without renal calculi, focal lesion, or hydronephrosis. Bladder is unremarkable. Stomach/Bowel: Stomach is within normal limits. Duodenal diverticulum again noted without focal inflammation. Proximal small bowel is dilated with fluid levels. Transition point noted at the umbilical hernia which now contains small bowel. The more distal small bowel is collapsed. Ascending transverse colon are within normal limits. Descending and sigmoid colon are mostly collapsed. Vascular/Lymphatic: No significant vascular findings are present. No enlarged abdominal or pelvic lymph nodes. Reproductive: Status post hysterectomy and bilateral salpingo oophorectomy. Other: Gas is now present within the paraumbilical hernia, likely postop. No other significant intraperitoneal gas is present following surgery. The extent of fat within the paraumbilical hernia is stable with the new addition of the incarcerated bowel.  Extensive stranding is present in the soft tissues surrounding the paraumbilical hernia. No new ventral hernias are present. Musculoskeletal: No acute or significant osseous findings. IMPRESSION: 1. High-grade sm

## 2021-12-08 NOTE — H&P (Signed)
?History and Physical  ? ?Brenda [name] BSW:[id number] DOB: 1961/04/15 DOA: 12/08/2021 ? ?PCP: Charlott Rakes, MD  ? ?Patient coming from: Home ? ?Chief Complaint: Abdominal pain ? ?HPI: Brenda [name] is a 61 y.o. female with medical history significant of hyperlipidemia, diabetes, hypertension, CAD, CHF, OSA, anxiety, depression, chronic respiratory failure, endometrial cancer status post hysterectomy and salpingo-oophorectomy 2 days ago presenting with abdominal pain. ? ?As above patient has endometrial cancer status post hysterectomy and salpingo-oophorectomy 2 days ago.  Since discharge she has had no bowel movements and has no flatus at this point.  She reports nausea and vomiting and abdominal distention as well.  Decreased p.o. intake for the past couple days.  She tried senna, tramadol, Tylenol at home without improvement. ? ?She denies fevers, chills, chest pain, shortness of breath. ? ?ED Course: Vital signs in the ED significant for blood pressure in the 466Z to 993T systolic.  Lab work included CMP with glucose 182, T. bili 1.3.  CBC showed stable leukocytosis of 14.8.  Lipase normal.  Lactic acid normal.  Urinalysis pending.  CT abdomen pelvis showing high-grade SBO with incarcerated bowel at umbilical hernia.  Status post hysterectomy and oophorectomy.  CAD.  Abdominal film performed showing NG tube in place.  Patient received fentanyl, Zofran, liter fluids in the ED.  General surgery consulted and will see the patient when able to but are seeing emergent surgeries at [facility], recommend NG tube and check lactic acid.  Also consulted considering recent surgery and will evaluate the patient while admitted. ? ?Review of Systems: As per HPI otherwise all other systems reviewed and are negative. ? ?Past Medical History:  ?Diagnosis Date  ? Anemia   ? Arthritis   ? "back, hands, hips" (02/22/2015)  ? CAD (coronary artery disease)   ? a. complex LAD/diagonal bifurcation PCI in 2010. b. STEMI  10/2019 s/p PTCA/DES x1 to mLAD overlapping the old stent, residual disease treated medicaly.  ? Cancer [facility])   ? uterine  ? CHF (congestive heart failure) ([place])   ? Depression   ? Diabetes mellitus ([place])   ? "started when I was pregnant; not sure if it was type 1 or type 2"   ? Hypercholesterolemia   ? Hypertension   ? Morbid obesity ([place])   ? Myocardial infarction [facility])   ? mild x 3  ? Neuromuscular disorder ([place])   ? neuropathy feet  ? Sleep apnea   ? cpap/  ? ? ?Past Surgical History:  ?Procedure Laterality Date  ? CARDIAC CATHETERIZATION  12/02/2012  ? CHOLECYSTECTOMY OPEN  09/04/1987  ? CORONARY ANGIOPLASTY WITH STENT PLACEMENT  09/03/2009  ? CORONARY/GRAFT ACUTE MI REVASCULARIZATION N/A 10/26/2019  ? Procedure: CORONARY/GRAFT ACUTE MI REVASCULARIZATION;  Surgeon: Burnell Blanks, MD;  Location: [facility];  Service: Cardiovascular;  Laterality: N/A;  ? LEFT HEART CATH AND CORONARY ANGIOGRAPHY N/A 10/26/2019  ? Procedure: LEFT HEART CATH AND CORONARY ANGIOGRAPHY;  Surgeon: Burnell Blanks, MD;  Location: [place] [facility];  Service: Cardiovascular;  Laterality: N/A;  ? LEFT HEART CATHETERIZATION WITH CORONARY ANGIOGRAM N/A 12/25/2012  ? Procedure: LEFT HEART CATHETERIZATION WITH CORONARY ANGIOGRAM;  Surgeon: Sherren Mocha, MD;  Location: [facility] CATH LAB;  Service: Cardiovascular;  Laterality: N/A;  ? ROBOTIC ASSISTED TOTAL HYSTERECTOMY WITH BILATERAL SALPINGO OOPHERECTOMY N/A 12/06/2021  ? Procedure: XI ROBOTIC ASSISTED TOTAL HYSTERECTOMY WITH BILATERAL SALPINGO OOPHORECTOMY;  Surgeon: Lafonda Mosses, MD;  Location: [facility];  Service: Gynecology;  Laterality: N/A;  ?  UMBILICAL HERNIA REPAIR  05/04/1989  ? doesn't think they used mesh  ? ? ?Social History ? reports that she quit smoking about 35 years ago. Her smoking use included cigarettes and cigars. She has a 0.50 pack-year smoking history. She has never used smokeless tobacco. She reports that she does not drink alcohol and does not  use drugs. ? ?Allergies  ?Allergen Reactions  ? Hydrocodone Shortness Of Breath  ?  "I forget to breathe."   ? Clopidogrel Bisulfate Rash  ? ? ?Family History  ?Problem Relation Age of Onset  ? Coronary artery disease Mother   ?     in her mid 22s  ? Hypertension Mother   ? Cancer Mother   ?     skin  ? Heart attack Father   ?     in his mid 38s  ? COPD Father   ? Stroke Sister   ? Coronary artery disease Sister   ? Diabetes Sister   ? Other Half-Sister   ? Prostate cancer Neg Hx   ? Colon cancer Neg Hx   ? Breast cancer Neg Hx   ? Endometrial cancer Neg Hx   ? Ovarian cancer Neg Hx   ? Pancreatic cancer Neg Hx   ?Reviewed on admission ? ?Prior to Admission medications   ?Medication Sig Start Date End Date Taking? Authorizing Provider  ?Acetaminophen-Caff-Pyrilamine (MIDOL COMPLETE) 500-60-15 MG TABS Take 2 tablets by mouth daily as needed (pain/cramps).   Yes [provider]  ?aspirin 81 MG EC tablet Take 1 tablet (81 mg total) by mouth daily. 02/14/21  Yes Mosetta Anis, MD  ?atorvastatin (LIPITOR) 80 MG tablet Take 1 tablet (80 mg total) by mouth every evening. 10/03/21  Yes Newlin, Enobong, MD  ?empagliflozin (JARDIANCE) 10 MG TABS tablet Take 1 tablet (10 mg total) by mouth daily. 10/03/21  Yes Charlott Rakes, MD  ?FLUoxetine (PROZAC) 20 MG capsule Take 1 capsule (20 mg total) by mouth daily. 10/03/21  Yes Charlott Rakes, MD  ?furosemide (LASIX) 40 MG tablet Take 1 tablet (40 mg total) by mouth daily. 06/26/21  Yes Charlott Rakes, MD  ?gabapentin (NEURONTIN) 300 MG capsule Take 2 capsules (600 mg total) by mouth at bedtime. 11/01/21  Yes Charlott Rakes, MD  ?insulin aspart (NOVOLOG) 100 UNIT/ML injection Inject 10-20 Units into the skin in the morning, at noon, and at bedtime. If BS<150=Take 10 units or If BS>150=Take 20 units   Yes [provider]  ?insulin detemir (LEVEMIR FLEXPEN) 100 UNIT/ML FlexPen Inject 22 Units into the skin 2 (two) times daily. ?Patient taking differently: Inject 25  Units into the skin 2 (two) times daily. 12/07/21  Yes Charlott Rakes, MD  ?Multiple Minerals (CALCIUM-MAGNESIUM-ZINC) TABS Take 1 tablet by mouth daily.   Yes [provider]  ?nitroGLYCERIN (NITROSTAT) 0.4 MG SL tablet Place 1 tablet (0.4 mg total) under the tongue every 5 (five) minutes as needed for chest pain (up to 3 doses. If taking 3rd dose call 911). 03/22/21  Yes Newlin, Enobong, MD  ?polyethylene glycol (MIRALAX / GLYCOLAX) 17 g packet Take 17 g by mouth daily as needed for moderate constipation.   Yes [provider]  ?prasugrel (EFFIENT) 5 MG TABS tablet Take 1 tablet (5 mg total) by mouth daily. 12/08/21 12/08/22 Yes Charlott Rakes, MD  ?sacubitril-valsartan (ENTRESTO) 24-26 MG Take 1 tablet by mouth 2 (two) times daily. 11/22/21  Yes Loel Dubonnet, NP  ?senna-docusate (SENOKOT-S) 8.6-50 MG tablet Take 2 tablets by mouth at  bedtime. For AFTER surgery, do not take if having diarrhea 12/06/21  Yes Cross, Melissa D, NP  ?traMADol (ULTRAM) 50 MG tablet Take 1 tablet (50 mg total) by mouth every 6 (six) hours as needed for severe pain. For AFTER surgery only, do not take and drive 02/08/33  Yes Cross, Melissa D, NP  ?vitamin B-12 (CYANOCOBALAMIN) 1000 MCG tablet Take 1,000 mcg by mouth daily.   Yes [provider]  ?Blood Glucose Monitoring Suppl (TRUE METRIX METER) DEVI 1 each by Does not apply route 3 (three) times daily. 02/28/15   Charlott Rakes, MD  ?glucose blood (TRUE METRIX BLOOD GLUCOSE TEST) test strip Use as instructed ?Patient taking differently: 3 (three) times daily. Use as instructed 02/28/15   Charlott Rakes, MD  ?Insulin Pen Needle (NOVOFINE) 30G X 8 MM MISC Use as directed with Novolin 70/30 insulin ?Patient taking differently: Use as directed with Novolin 70/30 insulin 10/28/19   Charlie Pitter, PA-C  ?Insulin Syringe-Needle U-100 30G X 1/2" 0.3 ML MISC use as directed 5 (five) times daily with insulin 02/14/21   Armando Reichert, MD  ?Misc. Devices MISC Portable oxygen  concentrator, 2L oxygen.  Diagnoses-chronic respiratory failure with hypoxia 11/27/21   Charlott Rakes, MD  ?Semaglutide,0.25 or 0.'5MG'$ /DOS, (OZEMPIC, 0.25 OR 0.5 MG/DOSE,) 2 MG/1.5ML SOPN Inject 0.5 mg into the

## 2021-12-08 NOTE — Progress Notes (Signed)
Consult received ? ?I have been called to [facility] to help with multiple trauma laparotomies ? ?Told the ER I am not sure when I will be able to see the patient in consultation. ? ?Told the patient does not have peritonitis or no signs of systemic illness ?No bowel wall thickening, portal venous gas or pneumatosis on imaging.  No signs of strangulation. ? ?Recommend bowel decompression with NG tube to low intermittent wall suction, n.p.o. except ice ? ?Admit to medical service ? ?We will see at some point this evening or early in the morning depending on how these emergencies at [facility] go ? ?The covering surgeon for gyn onc needs to be alerted and involved given the patient's postoperative complication ? ?Best guess is the fat-containing umbilical hernia was reduced during her robotic hysterectomy which left the fascial defect open allowing small bowel to herniate into the fascial defect resulting in the obstruction ? ?Leighton Ruff. Redmond Pulling, MD, FACS ?General, Bariatric, & Minimally Invasive Surgery ?[facility], [place] ? ?

## 2021-12-08 NOTE — Telephone Encounter (Signed)
Spoke with Brenda [name] this morning. She states she is eating, drinking and urinating well. She has not had a BM yet but is passing gas. She is taking senokot as prescribed and encouraged her to drink plenty of water. She denies fever or chills. Incisions are dry and intact. She rates her pain 4/10. Her pain is controlled with Tylenol and Tramadol.  She stated her blood sugars have been good and it was 123 this morning.  ? ?Instructed to call office with any fever, chills, purulent drainage, uncontrolled pain or any other questions or concerns. Patient verbalizes understanding.  ? ?Pt aware of post op appointments as well as the office number [phone] and after hours number [phone] to call if she has any questions or concerns  ?

## 2021-12-09 ENCOUNTER — Observation Stay (HOSPITAL_COMMUNITY): Payer: Medicaid Other | Admitting: Certified Registered"

## 2021-12-09 ENCOUNTER — Observation Stay (HOSPITAL_BASED_OUTPATIENT_CLINIC_OR_DEPARTMENT_OTHER): Payer: Medicaid Other | Admitting: Certified Registered"

## 2021-12-09 ENCOUNTER — Encounter (HOSPITAL_COMMUNITY)
Admission: EM | Disposition: A | Discharge status: Home / Self Care | Payer: Self-pay | Source: Home / Self Care | Attending: Osteopathic Medicine

## 2021-12-09 ENCOUNTER — Other Ambulatory Visit: Payer: Self-pay

## 2021-12-09 DIAGNOSIS — I2510 Atherosclerotic heart disease of native coronary artery without angina pectoris: Secondary | ICD-10-CM

## 2021-12-09 DIAGNOSIS — Z8542 Personal history of malignant neoplasm of other parts of uterus: Secondary | ICD-10-CM | POA: Diagnosis not present

## 2021-12-09 DIAGNOSIS — E119 Type 2 diabetes mellitus without complications: Secondary | ICD-10-CM | POA: Diagnosis not present

## 2021-12-09 DIAGNOSIS — Z7984 Long term (current) use of oral hypoglycemic drugs: Secondary | ICD-10-CM

## 2021-12-09 DIAGNOSIS — G4733 Obstructive sleep apnea (adult) (pediatric): Secondary | ICD-10-CM

## 2021-12-09 DIAGNOSIS — Z794 Long term (current) use of insulin: Secondary | ICD-10-CM | POA: Diagnosis not present

## 2021-12-09 DIAGNOSIS — J9611 Chronic respiratory failure with hypoxia: Secondary | ICD-10-CM

## 2021-12-09 DIAGNOSIS — Z90710 Acquired absence of both cervix and uterus: Secondary | ICD-10-CM | POA: Diagnosis not present

## 2021-12-09 DIAGNOSIS — E1169 Type 2 diabetes mellitus with other specified complication: Secondary | ICD-10-CM | POA: Diagnosis present

## 2021-12-09 DIAGNOSIS — F32A Depression, unspecified: Secondary | ICD-10-CM | POA: Diagnosis present

## 2021-12-09 DIAGNOSIS — I50810 Right heart failure, unspecified: Secondary | ICD-10-CM | POA: Diagnosis present

## 2021-12-09 DIAGNOSIS — Z87891 Personal history of nicotine dependence: Secondary | ICD-10-CM | POA: Diagnosis not present

## 2021-12-09 DIAGNOSIS — Z9049 Acquired absence of other specified parts of digestive tract: Secondary | ICD-10-CM | POA: Diagnosis not present

## 2021-12-09 DIAGNOSIS — Z955 Presence of coronary angioplasty implant and graft: Secondary | ICD-10-CM | POA: Diagnosis not present

## 2021-12-09 DIAGNOSIS — I10 Essential (primary) hypertension: Secondary | ICD-10-CM

## 2021-12-09 DIAGNOSIS — K420 Umbilical hernia with obstruction, without gangrene: Secondary | ICD-10-CM

## 2021-12-09 DIAGNOSIS — Z888 Allergy status to other drugs, medicaments and biological substances status: Secondary | ICD-10-CM | POA: Diagnosis not present

## 2021-12-09 DIAGNOSIS — I110 Hypertensive heart disease with heart failure: Secondary | ICD-10-CM | POA: Diagnosis present

## 2021-12-09 DIAGNOSIS — E7800 Pure hypercholesterolemia, unspecified: Secondary | ICD-10-CM | POA: Diagnosis present

## 2021-12-09 DIAGNOSIS — I5032 Chronic diastolic (congestive) heart failure: Secondary | ICD-10-CM | POA: Diagnosis present

## 2021-12-09 DIAGNOSIS — E1140 Type 2 diabetes mellitus with diabetic neuropathy, unspecified: Secondary | ICD-10-CM | POA: Diagnosis present

## 2021-12-09 DIAGNOSIS — Z9861 Coronary angioplasty status: Secondary | ICD-10-CM

## 2021-12-09 DIAGNOSIS — N9989 Other postprocedural complications and disorders of genitourinary system: Secondary | ICD-10-CM | POA: Diagnosis present

## 2021-12-09 DIAGNOSIS — K56609 Unspecified intestinal obstruction, unspecified as to partial versus complete obstruction: Secondary | ICD-10-CM | POA: Diagnosis present

## 2021-12-09 DIAGNOSIS — C541 Malignant neoplasm of endometrium: Secondary | ICD-10-CM

## 2021-12-09 DIAGNOSIS — F419 Anxiety disorder, unspecified: Secondary | ICD-10-CM | POA: Diagnosis present

## 2021-12-09 DIAGNOSIS — Y838 Other surgical procedures as the cause of abnormal reaction of the patient, or of later complication, without mention of misadventure at the time of the procedure: Secondary | ICD-10-CM | POA: Diagnosis present

## 2021-12-09 DIAGNOSIS — I252 Old myocardial infarction: Secondary | ICD-10-CM | POA: Diagnosis not present

## 2021-12-09 DIAGNOSIS — Z90722 Acquired absence of ovaries, bilateral: Secondary | ICD-10-CM | POA: Diagnosis not present

## 2021-12-09 DIAGNOSIS — E785 Hyperlipidemia, unspecified: Secondary | ICD-10-CM

## 2021-12-09 DIAGNOSIS — E118 Type 2 diabetes mellitus with unspecified complications: Secondary | ICD-10-CM

## 2021-12-09 DIAGNOSIS — Z885 Allergy status to narcotic agent status: Secondary | ICD-10-CM | POA: Diagnosis not present

## 2021-12-09 DIAGNOSIS — Z6841 Body Mass Index (BMI) 40.0 and over, adult: Secondary | ICD-10-CM | POA: Diagnosis not present

## 2021-12-09 HISTORY — PX: INCISIONAL HERNIA REPAIR: SHX193

## 2021-12-09 LAB — CBC
HCT: 36.2 % (ref 36.0–46.0)
Hemoglobin: 11.3 g/dL — ABNORMAL LOW (ref 12.0–15.0)
MCH: 26.3 pg (ref 26.0–34.0)
MCHC: 31.2 g/dL (ref 30.0–36.0)
MCV: 84.4 fL (ref 80.0–100.0)
Platelets: 331 10*3/uL (ref 150–400)
RBC: 4.29 MIL/uL (ref 3.87–5.11)
RDW: 16.7 % — ABNORMAL HIGH (ref 11.5–15.5)
WBC: 14.8 10*3/uL — ABNORMAL HIGH (ref 4.0–10.5)
nRBC: 0 % (ref 0.0–0.2)

## 2021-12-09 LAB — COMPREHENSIVE METABOLIC PANEL
ALT: 14 U/L (ref 0–44)
AST: 17 U/L (ref 15–41)
Albumin: 3.3 g/dL — ABNORMAL LOW (ref 3.5–5.0)
Alkaline Phosphatase: 60 U/L (ref 38–126)
Anion gap: 8 (ref 5–15)
BUN: 14 mg/dL (ref 6–20)
CO2: 24 mmol/L (ref 22–32)
Calcium: 8.4 mg/dL — ABNORMAL LOW (ref 8.9–10.3)
Chloride: 109 mmol/L (ref 98–111)
Creatinine, Ser: 0.81 mg/dL (ref 0.44–1.00)
GFR, Estimated: >60 mL/min (ref >60)
Glucose, Bld: 108 mg/dL — ABNORMAL HIGH (ref 70–99)
Potassium: 3.9 mmol/L (ref 3.5–5.1)
Sodium: 141 mmol/L (ref 135–145)
Total Bilirubin: 0.7 mg/dL (ref 0.3–1.2)
Total Protein: 6.8 g/dL (ref 6.5–8.1)

## 2021-12-09 LAB — GLUCOSE, CAPILLARY
Glucose-Capillary: 101 mg/dL — ABNORMAL HIGH (ref 70–99)
Glucose-Capillary: 122 mg/dL — ABNORMAL HIGH (ref 70–99)
Glucose-Capillary: 155 mg/dL — ABNORMAL HIGH (ref 70–99)
Glucose-Capillary: 197 mg/dL — ABNORMAL HIGH (ref 70–99)
Glucose-Capillary: 222 mg/dL — ABNORMAL HIGH (ref 70–99)

## 2021-12-09 SURGERY — LAPAROSCOPIC INTERNAL HERNIA REPAIR
Anesthesia: Choice

## 2021-12-09 SURGERY — REPAIR, HERNIA, INCISIONAL, LAPAROSCOPIC
Anesthesia: General

## 2021-12-09 MED ORDER — ACETAMINOPHEN 10 MG/ML IV SOLN
INTRAVENOUS | Status: AC
  Filled 2021-12-09: qty 100

## 2021-12-09 MED ORDER — LIDOCAINE 2% (20 MG/ML) 5 ML SYRINGE
INTRAMUSCULAR | Status: DC | PRN
  Administered 2021-12-09: 60 mg via INTRAVENOUS

## 2021-12-09 MED ORDER — KETAMINE HCL 10 MG/ML IJ SOLN
INTRAMUSCULAR | Status: DC | PRN
  Administered 2021-12-09: 50 mg via INTRAVENOUS

## 2021-12-09 MED ORDER — SODIUM CHLORIDE 0.9 % IV SOLN
INTRAVENOUS | Status: AC
  Filled 2021-12-09: qty 10

## 2021-12-09 MED ORDER — PHENYLEPHRINE HCL (PRESSORS) 10 MG/ML IV SOLN
INTRAVENOUS | Status: AC
  Filled 2021-12-09: qty 1

## 2021-12-09 MED ORDER — ROCURONIUM BROMIDE 100 MG/10ML IV SOLN
INTRAVENOUS | Status: DC | PRN
  Administered 2021-12-09: 50 mg via INTRAVENOUS

## 2021-12-09 MED ORDER — KETAMINE HCL 50 MG/5ML IJ SOSY
PREFILLED_SYRINGE | INTRAMUSCULAR | Status: AC
  Filled 2021-12-09: qty 5

## 2021-12-09 MED ORDER — LACTATED RINGERS IV SOLN
INTRAVENOUS | Status: DC | PRN
Start: 2021-12-09 — End: 2021-12-09

## 2021-12-09 MED ORDER — OXYCODONE HCL 5 MG PO TABS
ORAL_TABLET | ORAL | Status: AC
  Filled 2021-12-09: qty 1

## 2021-12-09 MED ORDER — SENNOSIDES-DOCUSATE SODIUM 8.6-50 MG PO TABS: 2.0000 | ORAL_TABLET | Freq: Two times a day (BID) | ORAL | Status: DC

## 2021-12-09 MED ORDER — OXYCODONE HCL 5 MG PO TABS
5.0000 mg | ORAL_TABLET | Freq: Once | ORAL | Status: AC | PRN
  Administered 2021-12-09: 5 mg via ORAL

## 2021-12-09 MED ORDER — CEFAZOLIN IN SODIUM CHLORIDE 3-0.9 GM/100ML-% IV SOLN
3.0000 g | INTRAVENOUS | Status: AC
  Administered 2021-12-09: 3 g via INTRAVENOUS
  Filled 2021-12-09 (×2): qty 100

## 2021-12-09 MED ORDER — SUGAMMADEX SODIUM 200 MG/2ML IV SOLN
INTRAVENOUS | Status: DC | PRN
  Administered 2021-12-09: 100 mg via INTRAVENOUS
  Administered 2021-12-09: 300 mg via INTRAVENOUS

## 2021-12-09 MED ORDER — FENTANYL CITRATE (PF) 250 MCG/5ML IJ SOLN
INTRAMUSCULAR | Status: AC
Start: 2021-12-09 — End: ?
  Filled 2021-12-09: qty 5

## 2021-12-09 MED ORDER — ONDANSETRON HCL 4 MG/2ML IJ SOLN
INTRAMUSCULAR | Status: DC | PRN
  Administered 2021-12-09: 4 mg via INTRAVENOUS

## 2021-12-09 MED ORDER — PHENYLEPHRINE HCL (PRESSORS) 10 MG/ML IV SOLN
INTRAVENOUS | Status: DC | PRN
  Administered 2021-12-09: 120 ug via INTRAVENOUS

## 2021-12-09 MED ORDER — 0.9 % SODIUM CHLORIDE (POUR BTL) OPTIME
TOPICAL | Status: DC | PRN
  Administered 2021-12-09: 1000 mL

## 2021-12-09 MED ORDER — MIDAZOLAM HCL 5 MG/5ML IJ SOLN
INTRAMUSCULAR | Status: DC | PRN
  Administered 2021-12-09: 2 mg via INTRAVENOUS

## 2021-12-09 MED ORDER — MIDAZOLAM HCL 2 MG/2ML IJ SOLN
INTRAMUSCULAR | Status: AC
  Filled 2021-12-09: qty 2

## 2021-12-09 MED ORDER — LACTATED RINGERS IV SOLN
INTRAVENOUS | Status: AC | PRN
  Administered 2021-12-09: 1000 mL

## 2021-12-09 MED ORDER — KETOROLAC TROMETHAMINE 30 MG/ML IJ SOLN
30.0000 mg | Freq: Once | INTRAMUSCULAR | Status: AC | PRN
  Administered 2021-12-09: 30 mg via INTRAVENOUS

## 2021-12-09 MED ORDER — OXYCODONE HCL 5 MG/5ML PO SOLN: 5.0000 mg | Freq: Once | ORAL | Status: AC | PRN

## 2021-12-09 MED ORDER — ACETAMINOPHEN 10 MG/ML IV SOLN
INTRAVENOUS | Status: DC | PRN
  Administered 2021-12-09: 1000 mg via INTRAVENOUS

## 2021-12-09 MED ORDER — AMISULPRIDE (ANTIEMETIC) 5 MG/2ML IV SOLN: 10.0000 mg | Freq: Once | INTRAVENOUS | Status: DC | PRN

## 2021-12-09 MED ORDER — TRAMADOL HCL 50 MG PO TABS
50.0000 mg | ORAL_TABLET | Freq: Four times a day (QID) | ORAL | Status: DC | PRN
  Administered 2021-12-10 – 2021-12-11 (×3): 50 mg via ORAL
  Filled 2021-12-09 (×3): qty 1

## 2021-12-09 MED ORDER — ONDANSETRON HCL 4 MG/2ML IJ SOLN
4.0000 mg | Freq: Four times a day (QID) | INTRAMUSCULAR | Status: DC | PRN
  Administered 2021-12-09: 4 mg via INTRAVENOUS
  Filled 2021-12-09: qty 2

## 2021-12-09 MED ORDER — KETOROLAC TROMETHAMINE 30 MG/ML IJ SOLN
INTRAMUSCULAR | Status: AC
  Filled 2021-12-09: qty 1

## 2021-12-09 MED ORDER — HYDROMORPHONE HCL 1 MG/ML IJ SOLN
1.0000 mg | INTRAMUSCULAR | Status: DC | PRN
  Administered 2021-12-09 – 2021-12-10 (×4): 1 mg via INTRAVENOUS
  Filled 2021-12-09 (×4): qty 1

## 2021-12-09 MED ORDER — SODIUM CHLORIDE 0.9 % IV SOLN
INTRAVENOUS | Status: AC
  Filled 2021-12-09: qty 20

## 2021-12-09 MED ORDER — BUPIVACAINE-EPINEPHRINE 0.25% -1:200000 IJ SOLN
INTRAMUSCULAR | Status: DC | PRN
  Administered 2021-12-09: 20 mL

## 2021-12-09 MED ORDER — HYDROMORPHONE HCL 1 MG/ML IJ SOLN: 0.2500 mg | INTRAMUSCULAR | Status: DC | PRN

## 2021-12-09 MED ORDER — ONDANSETRON HCL 4 MG/2ML IJ SOLN: 4.0000 mg | Freq: Once | INTRAMUSCULAR | Status: DC | PRN

## 2021-12-09 MED ORDER — PROPOFOL 10 MG/ML IV BOLUS
INTRAVENOUS | Status: DC | PRN
Start: 2021-12-09 — End: 2021-12-09
  Administered 2021-12-09: 150 mg via INTRAVENOUS

## 2021-12-09 MED ORDER — PHENYLEPHRINE HCL-NACL 20-0.9 MG/250ML-% IV SOLN
INTRAVENOUS | Status: DC | PRN
  Administered 2021-12-09: 30 ug/min via INTRAVENOUS

## 2021-12-09 MED ORDER — SENNOSIDES-DOCUSATE SODIUM 8.6-50 MG PO TABS
2.0000 | ORAL_TABLET | Freq: Two times a day (BID) | ORAL | Status: DC
  Administered 2021-12-09 – 2021-12-11 (×5): 2 via ORAL
  Filled 2021-12-09 (×5): qty 2

## 2021-12-09 MED ORDER — FENTANYL CITRATE (PF) 100 MCG/2ML IJ SOLN
INTRAMUSCULAR | Status: DC | PRN
  Administered 2021-12-09 (×2): 50 ug via INTRAVENOUS

## 2021-12-09 MED ORDER — SUCCINYLCHOLINE CHLORIDE 200 MG/10ML IV SOSY
PREFILLED_SYRINGE | INTRAVENOUS | Status: DC | PRN
  Administered 2021-12-09: 200 mg via INTRAVENOUS

## 2021-12-09 MED ORDER — KETAMINE HCL 10 MG/ML IJ SOLN
INTRAMUSCULAR | Status: AC
  Filled 2021-12-09: qty 1

## 2021-12-09 SURGICAL SUPPLY — 46 items
APPLIER CLIP 5 13 M/L LIGAMAX5 (MISCELLANEOUS)
BAG COUNTER SPONGE SURGICOUNT (BAG) IMPLANT
BINDER ABDOMINAL 12 ML 46-62 (SOFTGOODS) IMPLANT
CABLE HIGH FREQUENCY MONO STRZ (ELECTRODE) ×2 IMPLANT
CHLORAPREP W/TINT 26 (MISCELLANEOUS) ×2 IMPLANT
CLIP APPLIE 5 13 M/L LIGAMAX5 (MISCELLANEOUS) IMPLANT
DERMABOND ADVANCED (GAUZE/BANDAGES/DRESSINGS) ×1
DERMABOND ADVANCED .7 DNX12 (GAUZE/BANDAGES/DRESSINGS) ×1 IMPLANT
DEVICE SECURE STRAP 25 ABSORB (INSTRUMENTS) ×2 IMPLANT
DEVICE TROCAR PUNCTURE CLOSURE (ENDOMECHANICALS) ×3 IMPLANT
ELECT REM PT RETURN 15FT ADLT (MISCELLANEOUS) ×2 IMPLANT
GLOVE BIO SURGEON STRL SZ7.5 (GLOVE) ×2 IMPLANT
GOWN STRL REUS W/ TWL XL LVL3 (GOWN DISPOSABLE) ×3 IMPLANT
GOWN STRL REUS W/TWL XL LVL3 (GOWN DISPOSABLE) ×3
IRRIG SUCT STRYKERFLOW 2 WTIP (MISCELLANEOUS)
IRRIGATION SUCT STRKRFLW 2 WTP (MISCELLANEOUS) IMPLANT
KIT BASIN OR (CUSTOM PROCEDURE TRAY) ×2 IMPLANT
KIT TURNOVER KIT A (KITS) IMPLANT
MARKER SKIN DUAL TIP RULER LAB (MISCELLANEOUS) ×2 IMPLANT
MESH VENTRALIGHT ST 4.5IN (Mesh General) ×1 IMPLANT
NDL SPNL 22GX3.5 QUINCKE BK (NEEDLE) ×1 IMPLANT
NEEDLE SPNL 22GX3.5 QUINCKE BK (NEEDLE) ×2 IMPLANT
PAD POSITIONING PINK XL (MISCELLANEOUS) IMPLANT
PENCIL SMOKE EVACUATOR (MISCELLANEOUS) IMPLANT
PROTECTOR NERVE ULNAR (MISCELLANEOUS) IMPLANT
SCISSORS LAP 5X35 DISP (ENDOMECHANICALS) ×1 IMPLANT
SET TUBE SMOKE EVAC HIGH FLOW (TUBING) ×2 IMPLANT
SHEARS HARMONIC ACE PLUS 36CM (ENDOMECHANICALS) IMPLANT
SLEEVE XCEL OPT CAN 5 100 (ENDOMECHANICALS) ×2 IMPLANT
SOL ANTI FOG 6CC (MISCELLANEOUS) ×1 IMPLANT
SOLUTION ANTI FOG 6CC (MISCELLANEOUS) ×1
SPIKE FLUID TRANSFER (MISCELLANEOUS) ×2 IMPLANT
STRIP CLOSURE SKIN 1/2X4 (GAUZE/BANDAGES/DRESSINGS) IMPLANT
SUT MNCRL AB 4-0 PS2 18 (SUTURE) IMPLANT
SUT NOVA 1 T20/GS 25DT (SUTURE) ×1 IMPLANT
SUT NOVA NAB DX-16 0-1 5-0 T12 (SUTURE) ×2 IMPLANT
TACKER 5MM HERNIA 3.5CML NAB (ENDOMECHANICALS) IMPLANT
TAPE CLOTH 4X10 WHT NS (GAUZE/BANDAGES/DRESSINGS) IMPLANT
TOWEL OR 17X26 10 PK STRL BLUE (TOWEL DISPOSABLE) ×2 IMPLANT
TOWEL OR NON WOVEN STRL DISP B (DISPOSABLE) ×2 IMPLANT
TRAY FOLEY MTR SLVR 16FR STAT (SET/KITS/TRAYS/PACK) ×2 IMPLANT
TRAY LAPAROSCOPIC (CUSTOM PROCEDURE TRAY) ×2 IMPLANT
TROCAR 12M 150ML BLUNT (TROCAR) ×1 IMPLANT
TROCAR BLADELESS OPT 5 100 (ENDOMECHANICALS) ×2 IMPLANT
TROCAR BLADELESS OPT 5 150 (ENDOMECHANICALS) ×2 IMPLANT
TROCAR XCEL NON-BLD 11X100MML (ENDOMECHANICALS) ×2 IMPLANT

## 2021-12-09 NOTE — Anesthesia Postprocedure Evaluation (Signed)
Anesthesia Post Note ? ?Patient: Brenda [name] ? ?Procedure(s) Performed: LAPAROSCOPIC REPAIR UMBILICAL HERNIA WITH MESH ? ?  ? ?Patient location during evaluation: PACU ?Anesthesia Type: General ?Level of consciousness: awake and alert, oriented and patient cooperative ?Pain management: pain level controlled ?Vital Signs Assessment: post-procedure vital signs reviewed and stable ?Respiratory status: spontaneous breathing, nonlabored ventilation and respiratory function stable ?Cardiovascular status: blood pressure returned to baseline and stable ?Postop Assessment: no apparent nausea or vomiting ?Anesthetic complications: no ? ? ?No notable events documented. ? ?Last Vitals:  ?Vitals:  ? 12/09/21 1145 12/09/21 1200  ?BP: (!) 151/68 (!) 142/66  ?Pulse: (!) 101 (!) 101  ?Resp: 14 12  ?Temp:    ?SpO2: 92% 94%  ?  ?Last Pain:  ?Vitals:  ? 12/09/21 1200  ?TempSrc:   ?PainSc: 0-No pain  ? ? ?  ?  ?  ?  ?  ?  ? ?[name] ? ? ? ? ?

## 2021-12-09 NOTE — Anesthesia Procedure Notes (Signed)
Procedure Name: Intubation ?Date/Time: 12/09/2021 10:27 AM ?Performed by: Cleda Daub, CRNA ?Pre-anesthesia Checklist: Patient identified, Emergency Drugs available, Suction available and Patient being monitored ?Patient Re-evaluated:Patient Re-evaluated prior to induction ?Oxygen Delivery Method: Circle system utilized ?Preoxygenation: Pre-oxygenation with 100% oxygen ?Induction Type: IV induction ?Ventilation: Mask ventilation without difficulty ?Laryngoscope Size: Glidescope and 4 ?Grade View: Grade I ?Tube type: Oral ?Tube size: 7.5 mm ?Number of attempts: 1 ?Airway Equipment and Method: Stylet and Oral airway ?Placement Confirmation: ETT inserted through vocal cords under direct vision, positive ETCO2 and breath sounds checked- equal and bilateral ?Secured at: 22 cm ?Tube secured with: Tape ?Dental Injury: Teeth and Oropharynx as per pre-operative assessment  ?Difficulty Due To: Difficulty was anticipated ?Future Recommendations: Recommend- induction with short-acting agent, and alternative techniques readily available ? ? ? ? ?

## 2021-12-09 NOTE — Assessment & Plan Note (Addendum)
>   Status post multiple stenting ?- Continue home aspirin, atorvastatin when tolerating p.o. ?

## 2021-12-09 NOTE — Consult Note (Addendum)
Reason for Consult:incarcerated umbilical hernia Referring Physician: Dr.Alexander Mickeal Needy Brenda [name] is an 61 y.o. female.  HPI: This is a 61 year old female who underwent a robotic assisted laparoscopic total hysterectomy and bilateral salpingo-oophorectomy on 4/5.  At the time of surgery she was seen to have a hernia at the umbilicus containing omentum.  This was reduced at the time of surgery.  The patient was discharged home the following day.  She has multiple comorbidities including diabetes, hypertension, CAD, CHF, chronic respiratory failure, and OSA.  The procedure was done for endometrial cancer.  Yesterday she developed nausea, vomiting, and abdominal pain.  She presented to the emergency room where last evening a CT scan showed an incarcerated loop of bowel at the previous umbilical hernia site.  Our general surgeon on-call was notified as well as the GYN oncologist covering from [facility].  Our on-call surgeon, however, was involved with multiple emergent trauma laparotomies at [facility].  The patient was admitted and an NG tube was placed.  She still is having abdominal discomfort.  Past Medical History:  Diagnosis Date   Anemia    Arthritis    "back, hands, hips" (02/22/2015)   CAD (coronary artery disease)    a. complex LAD/diagonal bifurcation PCI in 2010. b. STEMI 10/2019 s/p PTCA/DES x1 to mLAD overlapping the old stent, residual disease treated medicaly.   Cancer (HCC)    uterine   CHF (congestive heart failure) (HCC)    Depression    Diabetes mellitus (HCC)    "started when I was pregnant; not sure if it was type 1 or type 2"    Hypercholesterolemia    Hypertension    Morbid obesity (HCC)    Myocardial infarction (HCC)    mild x 3   Neuromuscular disorder (HCC)    neuropathy feet   Sleep apnea    cpap/    Past Surgical History:  Procedure Laterality Date   CARDIAC CATHETERIZATION  12/02/2012   CHOLECYSTECTOMY OPEN  09/04/1987   CORONARY ANGIOPLASTY  WITH STENT PLACEMENT  09/03/2009   CORONARY/GRAFT ACUTE MI REVASCULARIZATION N/A 10/26/2019   Procedure: CORONARY/GRAFT ACUTE MI REVASCULARIZATION;  Surgeon: Kathleene Hazel, MD;  Location: [facility] INVASIVE CV LAB;  Service: Cardiovascular;  Laterality: N/A;   LEFT HEART CATH AND CORONARY ANGIOGRAPHY N/A 10/26/2019   Procedure: LEFT HEART CATH AND CORONARY ANGIOGRAPHY;  Surgeon: Kathleene Hazel, MD;  Location: [facility] INVASIVE CV LAB;  Service: Cardiovascular;  Laterality: N/A;   LEFT HEART CATHETERIZATION WITH CORONARY ANGIOGRAM N/A 12/25/2012   Procedure: LEFT HEART CATHETERIZATION WITH CORONARY ANGIOGRAM;  Surgeon: Tonny Bollman, MD;  Location: [facility] CATH LAB;  Service: Cardiovascular;  Laterality: N/A;   ROBOTIC ASSISTED TOTAL HYSTERECTOMY WITH BILATERAL SALPINGO OOPHERECTOMY N/A 12/06/2021   Procedure: XI ROBOTIC ASSISTED TOTAL HYSTERECTOMY WITH BILATERAL SALPINGO OOPHORECTOMY;  Surgeon: Carver Fila, MD;  Location: [facility] ORS;  Service: Gynecology;  Laterality: N/A;   UMBILICAL HERNIA REPAIR  05/04/1989   doesn't think they used mesh    Family History  Problem Relation Age of Onset   Coronary artery disease Mother        in her mid 34s   Hypertension Mother    Cancer Mother        skin   Heart attack Father        in his mid 10s   COPD Father    Stroke Sister    Coronary artery disease Sister    Diabetes Sister    Other Half-Sister  Prostate cancer Neg Hx    Colon cancer Neg Hx    Breast cancer Neg Hx    Endometrial cancer Neg Hx    Ovarian cancer Neg Hx    Pancreatic cancer Neg Hx     Social History:  reports that she quit smoking about 35 years ago. Her smoking use included cigarettes and cigars. She has a 0.50 pack-year smoking history. She has never used smokeless tobacco. She reports that she does not drink alcohol and does not use drugs.  Allergies:  Allergies  Allergen Reactions   Hydrocodone Shortness Of Breath    "I forget to breathe."    Clopidogrel  Bisulfate Rash    Medications: I have reviewed the patient's current medications.  Results for orders placed or performed during the hospital encounter of 12/08/21 (from the past 48 hour(s))  CBC with Differential     Status: Abnormal   Collection Time: 12/08/21  6:36 PM  Result Value Ref Range   WBC 14.8 (H) 4.0 - 10.5 K/uL   RBC 4.75 3.87 - 5.11 MIL/uL   Hemoglobin 12.5 12.0 - 15.0 g/dL   HCT 16.1 09.6 - 04.5 %   MCV 84.2 80.0 - 100.0 fL   MCH 26.3 26.0 - 34.0 pg   MCHC 31.3 30.0 - 36.0 g/dL   RDW 40.9 (H) 81.1 - 91.4 %   Platelets 350 150 - 400 K/uL   nRBC 0.0 0.0 - 0.2 %   Neutrophils Relative % 82 %   Neutro Abs 12.2 (H) 1.7 - 7.7 K/uL   Lymphocytes Relative 10 %   Lymphs Abs 1.5 0.7 - 4.0 K/uL   Monocytes Relative 6 %   Monocytes Absolute 0.9 0.1 - 1.0 K/uL   Eosinophils Relative 1 %   Eosinophils Absolute 0.2 0.0 - 0.5 K/uL   Basophils Relative 0 %   Basophils Absolute 0.0 0.0 - 0.1 K/uL   Immature Granulocytes 1 %   Abs Immature Granulocytes 0.07 0.00 - 0.07 K/uL    Comment: Performed at [facility], [address] [place], [place] [address]  Comprehensive metabolic panel     Status: Abnormal   Collection Time: 12/08/21  6:36 PM  Result Value Ref Range   Sodium 138 135 - 145 mmol/L   Potassium 4.4 3.5 - 5.1 mmol/L   Chloride 105 98 - 111 mmol/L   CO2 24 22 - 32 mmol/L   Glucose, Bld 182 (H) 70 - 99 mg/dL    Comment: Glucose reference range applies only to samples taken after fasting for at least 8 hours.   BUN 18 6 - 20 mg/dL   Creatinine, Ser 6.21 0.44 - 1.00 mg/dL   Calcium 8.9 8.9 - 30.8 mg/dL   Total Protein 8.0 6.5 - 8.1 g/dL   Albumin 3.7 3.5 - 5.0 g/dL   AST 32 15 - 41 U/L   ALT 16 0 - 44 U/L   Alkaline Phosphatase 71 38 - 126 U/L   Total Bilirubin 1.3 (H) 0.3 - 1.2 mg/dL   GFR, Estimated >65 >78 mL/min    Comment: (NOTE) Calculated using the CKD-EPI Creatinine Equation (2021)    Anion gap 9 5 - 15    Comment: Performed at  [facility], [address], [place], [place] [address]  Lipase, blood     Status: None   Collection Time: 12/08/21  6:36 PM  Result Value Ref Range   Lipase 22 11 - 51 U/L    Comment:  Performed at [facility], [address] [place], [place] [address]  Lactic acid, plasma     Status: None   Collection Time: 12/08/21  8:27 PM  Result Value Ref Range   Lactic Acid, Venous 1.4 0.5 - 1.9 mmol/L    Comment: Performed at [facility], [address] [place], [place] [address]  Glucose, capillary     Status: Abnormal   Collection Time: 12/08/21 11:15 PM  Result Value Ref Range   Glucose-Capillary 136 (H) 70 - 99 mg/dL    Comment: Glucose reference range applies only to samples taken after fasting for at least 8 hours.  Glucose, capillary     Status: Abnormal   Collection Time: 12/09/21  3:50 AM  Result Value Ref Range   Glucose-Capillary 101 (H) 70 - 99 mg/dL    Comment: Glucose reference range applies only to samples taken after fasting for at least 8 hours.  Comprehensive metabolic panel     Status: Abnormal   Collection Time: 12/09/21  4:34 AM  Result Value Ref Range   Sodium 141 135 - 145 mmol/L   Potassium 3.9 3.5 - 5.1 mmol/L   Chloride 109 98 - 111 mmol/L   CO2 24 22 - 32 mmol/L   Glucose, Bld 108 (H) 70 - 99 mg/dL    Comment: Glucose reference range applies only to samples taken after fasting for at least 8 hours.   BUN 14 6 - 20 mg/dL   Creatinine, Ser 0.86 0.44 - 1.00 mg/dL   Calcium 8.4 (L) 8.9 - 10.3 mg/dL   Total Protein 6.8 6.5 - 8.1 g/dL   Albumin 3.3 (L) 3.5 - 5.0 g/dL   AST 17 15 - 41 U/L   ALT 14 0 - 44 U/L   Alkaline Phosphatase 60 38 - 126 U/L   Total Bilirubin 0.7 0.3 - 1.2 mg/dL   GFR, Estimated >57 >84 mL/min    Comment: (NOTE) Calculated using the CKD-EPI Creatinine Equation (2021)    Anion gap 8 5 - 15    Comment: Performed at [facility], [address], [place],  [place] [address]  CBC     Status: Abnormal   Collection Time: 12/09/21  4:34 AM  Result Value Ref Range   WBC 14.8 (H) 4.0 - 10.5 K/uL   RBC 4.29 3.87 - 5.11 MIL/uL   Hemoglobin 11.3 (L) 12.0 - 15.0 g/dL   HCT 52.8 41.3 - 24.4 %   MCV 84.4 80.0 - 100.0 fL   MCH 26.3 26.0 - 34.0 pg   MCHC 31.2 30.0 - 36.0 g/dL   RDW 01.0 (H) 27.2 - 53.6 %   Platelets 331 150 - 400 K/uL   nRBC 0.0 0.0 - 0.2 %    Comment: Performed at [facility], [address] [place], [place] [address]    CT ABDOMEN PELVIS W CONTRAST  Result Date: 12/08/2021 CLINICAL DATA:  Abdominal pain following robotic assisted laparoscopic total hysterectomy 2 days ago. EXAM: CT ABDOMEN AND PELVIS WITH CONTRAST TECHNIQUE: Multidetector CT imaging of the abdomen and pelvis was performed using the standard protocol following bolus administration of intravenous contrast. RADIATION DOSE REDUCTION: This exam was performed according to the departmental dose-optimization program which includes automated exposure control, adjustment of the mA and/or kV according to patient size and/or use of iterative reconstruction technique. CONTRAST:  OMNIPAQUE IOHEXOL 300 MG/ML  SOLN COMPARISON:  CT abdomen and pelvis 11/21/2021. FINDINGS: Lower chest: The lung  bases are clear without focal nodule, mass, or airspace disease. Heart is mildly enlarged. Coronary artery calcifications are present. No significant pleural or pericardial effusion is present. Hepatobiliary: No focal liver abnormality is seen. Status post cholecystectomy. No biliary dilatation. Pancreas: Unremarkable. No pancreatic ductal dilatation or surrounding inflammatory changes. Spleen: Normal in size without focal abnormality. Adrenals/Urinary Tract: Adrenal glands are unremarkable. Kidneys are normal, without renal calculi, focal lesion, or hydronephrosis. Bladder is unremarkable. Stomach/Bowel: Stomach is within normal limits. Duodenal diverticulum again noted without focal  inflammation. Proximal small bowel is dilated with fluid levels. Transition point noted at the umbilical hernia which now contains small bowel. The more distal small bowel is collapsed. Ascending transverse colon are within normal limits. Descending and sigmoid colon are mostly collapsed. Vascular/Lymphatic: No significant vascular findings are present. No enlarged abdominal or pelvic lymph nodes. Reproductive: Status post hysterectomy and bilateral salpingo oophorectomy. Other: Gas is now present within the paraumbilical hernia, likely postop. No other significant intraperitoneal gas is present following surgery. The extent of fat within the paraumbilical hernia is stable with the new addition of the incarcerated bowel. Extensive stranding is present in the soft tissues surrounding the paraumbilical hernia. No new ventral hernias are present. Musculoskeletal: No acute or significant osseous findings. IMPRESSION: 1. High-grade small bowel obstruction secondary incarcerated bowel within the previously noted paraumbilical hernia. The loop of bowel in the hernia is new in the source of obstruction. 2. Status post hysterectomy and bilateral salpingo oophorectomy. 3. Coronary artery disease. These results were called by telephone at the time of interpretation on 12/08/2021 at 7:55 pm to provider ADAM CURATOLO , who verbally acknowledged these results. Electronically Signed   By: Marin Roberts M.D.   On: 12/08/2021 20:03   DG Abd Portable 1 View  Result Date: 12/08/2021 CLINICAL DATA:  NG tube placement EXAM: PORTABLE ABDOMEN - 1 VIEW COMPARISON:  None. FINDINGS: Esophageal tube tip overlies the distal stomach. Elevation of right diaphragm. IMPRESSION: Esophageal tube tip overlies the distal stomach. Electronically Signed   By: Jasmine Pang M.D.   On: 12/08/2021 21:25    Review of Systems  All other systems reviewed and are negative. Blood pressure 137/62, pulse 93, temperature 98.2 F (36.8 C), temperature  source Oral, resp. rate 20, height 5\' 2"  (1.575 m), weight 134.4 kg, last menstrual period 10/30/2014, SpO2 100 %. Physical Exam Constitutional:      Appearance: She is obese. She is not ill-appearing or diaphoretic.  HENT:     Head: Normocephalic and atraumatic.  Eyes:     General: No scleral icterus. Cardiovascular:     Rate and Rhythm: Normal rate and regular rhythm.  Pulmonary:     Effort: Pulmonary effort is normal. No respiratory distress.     Breath sounds: Normal breath sounds.  Abdominal:     Comments: Her abdomen is morbidly obese.  There are multiple incisions from her recent surgery covered Dermabond.  There is an incarcerated hernia above the umbilicus which is tender.  Given her morbid obesity it is difficult to feel and I cannot reduce it.  By CT scan, the fascial defect is 3 cm  Skin:    General: Skin is warm and dry.  Neurological:     General: No focal deficit present.     Mental Status: She is alert and oriented to person, place, and time.  Psychiatric:        Mood and Affect: Mood is not anxious.        Behavior: Behavior  normal.    Assessment/Plan: Incarcerated umbilical hernia containing small bowel creating a small bowel obstruction with at least 3 cm fascial defect.  I discussed this at length with the patient and her husband.  She needs urgent treatment repair of the hernia because of the small bowel involved and risk of strangulation.  She would rather stay here instead of being transferred to [facility] which I believe is reasonable given the urgent nature of the surgery. I discussed this with her and her husband.  I would recommend an attempt at a laparoscopic repair.  This would be with a laparoscopic, possible open repair of the incarcerated umbilical hernia with possible mesh.  We discussed the risks in detail.  These include but are not limited to bleeding, infection, the need to convert to an open procedure, the risk of injury to surrounding  structures, the need for possible bowel resection, the need to use mesh, cardiopulmonary issues, the need for further procedures, hernia recurrence, DVT, etc.  They understand and wish to proceed.  Surgery is scheduled urgently.  Complex medical decision making  Abigail Miyamoto MD [facility] 12/09/2021, 7:37 AM

## 2021-12-09 NOTE — Assessment & Plan Note (Signed)
Resume SSRI when tolerating po  ?

## 2021-12-09 NOTE — Assessment & Plan Note (Signed)
See above re CHF ?

## 2021-12-09 NOTE — Transfer of Care (Signed)
Immediate Anesthesia Transfer of Care Note ? ?Patient: Javayah Magaw ? ?Procedure(s) Performed: LAPAROSCOPIC REPAIR UMBILICAL HERNIA WITH MESH ? ?Patient Location: PACU ? ?Anesthesia Type:General ? ?Level of Consciousness: awake, alert , oriented and patient cooperative ? ?Airway & Oxygen Therapy: Patient Spontanous Breathing and Patient connected to face mask oxygen ? ?Post-op Assessment: Report given to RN and Post -op Vital signs reviewed and stable ? ?Post vital signs: Reviewed and stable ? ?Last Vitals:  ?Vitals Value Taken Time  ?BP 162/91 12/09/21 1138  ?Temp    ?Pulse 101 12/09/21 1145  ?Resp 24 12/09/21 1145  ?SpO2 92 % 12/09/21 1145  ?Vitals shown include unvalidated device data. ? ?Last Pain:  ?Vitals:  ? 12/09/21 0703  ?TempSrc:   ?PainSc: 9   ?   ? ?Patients Stated Pain Goal: 1 (12/09/21 0703) ? ?Complications: No notable events documented. ?

## 2021-12-09 NOTE — Assessment & Plan Note (Signed)
>   Last echo in 2022 with EF 60-65%, moderately increased RV function. ?> On 2 L chronic oxygen. ?- Provided is able to tolerate p.o. we will continue with Lasix, Entresto, Jardiance ?-Gentle IV fluids mainteined overnight 4/7-8/23 and 5 cc an hour, did receive a liter in the ED ?

## 2021-12-09 NOTE — Progress Notes (Signed)
End of shift ? ?Pt went to surgery this morning for a SBO repair.  Bowel sounds present upon return to the floor.  Pt has not had a BM and stated that she thinks her abdominal pain is because she has to have a BM.  Pt advised that ambulating will help facilitate the BM.  ? ?Anticipate pt discharging after successful BM. ?

## 2021-12-09 NOTE — Assessment & Plan Note (Addendum)
>   Patient presenting with abdominal pain and constipation with no flatus is in the setting of recent hysterectomy and bilateral salpingo-oophorectomy 2 days prior to admission, done for endometrial cancer. ?> CT of the abdomen pelvis showed high-grade SBO with incarcerated bowel at known periumbilical hernia. Hernia previously containing fat, thought process is during surgery at this that likely was freed and the opportunity presented for bowel to become contained within hernia. ?> General surgery consulted, NG placed 12/08/21, laparoscopy repair incarcerated hernia 12/09/21 --> surgery went well, see op note  ?- Advance diet as tolerated per surgery  ?- As needed pain control ?- NG tube out ?-improving 12/10/21 Surgery ok for discharge once pt tolerating po, passing flatus, pain controlled. May be able to d/c home later today or possibly tomorrow pending improvement in those areas.  ? ?

## 2021-12-09 NOTE — Op Note (Signed)
? ?Aneesah Hernan ?12/09/2021 ? ? ?Pre-op Diagnosis: incarcerated umbilical hernia ?    ?Post-op Diagnosis: same ? ?Procedure(s): ?LAPAROSCOPIC REPAIR INCARCERATED UMBILICAL HERNIA WITH MESH (11 CM  ROUND VENTRALIGHT ST FROM [place]) ?3 CM FASCIAL DEFECT ? ? ? ?Surgeon(s): ?Coralie Keens, MD ? ?Anesthesia: Choice ? ?Staff:  ?Circulator: Alena Bills, RN ?Scrub Person: Lois Huxley; Joellen Jersey, CST ? ?Estimated Blood Loss: Minimal ?              ?Indications: This is a 61 year old female who is several days status post a robotic assisted hysterectomy and salpingo-oophorectomy.  At the time of the surgery the patient was found to have a 3 cm fascial defect at the umbilicus containing omentum.  The omentum had to be reduced from the hernia in order to visualize a hysterectomy.  She tolerated procedure well was discharged home but then the day after discharge developed nausea and vomiting.  She was found on CT scan to have a knuckle of small bowel incarcerated at the hernia defect site guarding a small bowel obstruction.  The decision was made to proceed to the operating room. ? ?Findings: After induction of anesthesia, the bowel easily reduced.  It was able to be visualized laparoscopically and there was no evidence of ischemia of the bowel.  The fascial defect was only 3 cm in size and was repaired with 11 cm round Prolene coated Ventralight ST mesh from [place] ? ?Procedure: The patient is brought to operating identifies correct patient.  She is placed upon on the operating table general anesthesia was induced.  Her abdomen was then prepped and draped in usual sterile fashion.  I made incision with a scalpel in the patient's left upper quadrant of the abdominal wall.  I then used the long 5 mm trocar and Optiview camera to slowly traversed all layers of the abdominal wall and gained entrance in the abdominal cavity.  At this point it was evident that the bowel had been reduced and the fascial defect  at the umbilicus was easily visualized.  It was approximate 3 cm in size.  I placed a another 5 mm trocar in the patient's left mid abdomen under direct vision.  I evaluated the bowel that had been in the hernia and there was no evidence of ischemia.  At this point this trocar was upsized to an 11 mm trocar under direct vision.  I brought a 3 cm round piece of Ventralight ST mesh onto the field.  I placed two #1  Novafil utures on each side of the mesh.  The mesh was then rolled up and soaked in saline and then placed under direct vision through the 11 mm trocar and into the abdominal cavity.  I then made a stab incision with 11 blade at the midline superior and inferior to the fascial defect.  I then used the suture passer to pull the ends of the suture up through the abdominal wall.  These were then tied in place pulling the mesh up taut against the abdominal wall.  I did place 1 more 5 mm trocar in the patient's right upper quadrant and was then able to tacked the mesh in circumferentially with several rows of absorbable tacks.  Very wide coverage of the fascial defect appeared to be achieved.  Hemostasis also appeared to be achieved.  We evaluated the trocar sites and saw no evidence of bowel injury.  The abdomen was slowly deflated and all trocars were removed under direct vision.  I placed a 0 Vicryl suture at the fascia of the 11 mm trocar site.  All incisions were anesthetized Marcaine and closed with 4-0 Monocryl sutures and Dermabond.  The patient tolerated the procedure well.  All the counts were correct at the end of the procedure.  The patient was then extubated in the operating room and taken in stable condition to the recovery room. ? ?Coralie Keens  ? ?Date: 12/09/2021  Time: 11:26 AM ? ? ? ?

## 2021-12-09 NOTE — Assessment & Plan Note (Signed)
See above re: CHF ?

## 2021-12-09 NOTE — Assessment & Plan Note (Signed)
Continue statin, ASA when able to tolerate po  ?

## 2021-12-09 NOTE — Assessment & Plan Note (Signed)
>   Status post hysterectomy and bilateral salpingo-oophorectomy as above. ?- Continue to monitor ?- Appreciate gynecology oncology recommendations ?

## 2021-12-09 NOTE — Assessment & Plan Note (Addendum)
Holding BiPap/CPAP while NG in place  ?BiPap to resume tonight 12/09/21 ? ?

## 2021-12-09 NOTE — Progress Notes (Signed)
? ? ?PROGRESS NOTE ? ?Patient: Brenda [name] 61 y.o. female ?MRN: [id number] ? ?Today is hospital day 0 after presenting to ED on 12/08/2021  6:21 PM with  ?Chief Complaint  ?Patient presents with  ? Abdominal Pain  ? ?Brenda [name] is a 61 y.o. female with medical history significant of hyperlipidemia, diabetes, hypertension, CAD, CHF, OSA, anxiety, depression, chronic respiratory failure, endometrial cancer status post hysterectomy and salpingo-oophorectomy 12/07/21 presenting with abdominal pain. ? ?RECORD REVIEW AND HOSPITAL COURSE: ?12/08/21: to ED as above, CT abdomen pelvis showing high-grade SBO with incarcerated bowel at umbilical hernia.  Status post hysterectomy and oophorectomy. Abdominal film performed showing NG tube in place.  Patient received fentanyl, Zofran, liter fluids in the ED.  General surgery consulted, unable to see patient immediately d/t emergent cases as [facility], recommend NG tube and check lactic acid. Hernia previously containing fat, thought process is during surgery at this that likely was freed and the opportunity presented for bowel to become contained within hernia ?Today 12/09/21: surgery was able to see patient early this AM, planning for surgery today laparoscopic, possible open repair of the incarcerated umbilical hernia with possible mesh ? ?Procedures and Significant Results:  ?12/09/21 Laparoscopic repair incarcerated umbilical hernia with mesh ? ?Consultants:  ?General Surgery  ? ? ? ?SUBJECTIVE today 12/09/21:  ?Patient seen and examined sitting up in chair at bedside today, no apparent distress.  She reports pain is controlled, requesting Senokot which has helped with constipation in the past.  Requests incentive spirometer.  ? ? ? ? ?ASSESSMENT & PLAN ? ?SBO (small bowel obstruction) ([place]) ?> Patient presenting with abdominal pain and constipation with no flatus is in the setting of recent hysterectomy and bilateral salpingo-oophorectomy 2 days prior to admission,  done for endometrial cancer. ?> CT of the abdomen pelvis showed high-grade SBO with incarcerated bowel at known periumbilical hernia. Hernia previously containing fat, thought process is during surgery at this that likely was freed and the opportunity presented for bowel to become contained within hernia. ?> General surgery consulted, NG placed 12/08/21, plan for laparoscopy possible open repair today 12/09/21 --> surgery went well, see op note  ?- Appreciate general surgery and gynecology oncology recommendations ?- N.p.o. except for ice chips --> advance diet as tolerated per surgery  ?- As needed pain control ?- NG tube removed ? ? ?Endometrial cancer ([place]) ?> Status post hysterectomy and bilateral salpingo-oophorectomy as above. ?- Continue to monitor ?- Appreciate gynecology oncology recommendations ? ?(HFpEF) heart failure with preserved ejection fraction ([place]) ?> Last echo in 2022 with EF 60-65%, moderately increased RV function. ?> On 2 L chronic oxygen. ?- Provided is able to tolerate p.o. we will continue with Lasix, Entresto, Jardiance ?-Gentle IV fluids mainteined overnight 4/7-8/23 and 5 cc an hour, did receive a liter in the ED ? ?Essential hypertension ?See above re CHF ? ?Hyperlipidemia associated with type 2 diabetes mellitus ([place]) ?> Status post multiple stenting ?- Continue home aspirin, atorvastatin when tolerating p.o. ? ?Coronary Artery Disease ?Continue statin, ASA when able to tolerate po  ? ?OSA treated with BiPAP ?Holding BiPap/CPAP while NG in place  ? ?Type 2 diabetes mellitus with complication, with long-term current use of insulin (HCC) ?> 20 units twice daily and SSI at home ?- 12 units twice daily and SSI ? ?Chronic respiratory failure ([place]) ?See above re: CHF ? ?Anxiety and depression ?Resume SSRI when tolerating po  ? ?  ? ? ?VTE Ppx: SCD pending surgery  ?CODE STATUS:  FULL ?Admitted from: home ?Expected Dispo: home ?Barriers to discharge: surgery planned for today 12/09/21   ? ? ? ? ? ? ? ? ? ? ? ? ? ?Objective: ?Vital signs in last 24 hours: ?Temp:  [98.2 ?F (36.8 ?C)-99.1 ?F (37.3 ?C)] 98.2 ?F (36.8 ?C) (04/08 7829) ?Pulse Rate:  [72-99] 93 (04/08 0635) ?Resp:  [17-20] 20 (04/08 5621) ?BP: (113-187)/(39-96) 137/62 (04/08 3086) ?SpO2:  [96 %-100 %] 100 % (04/08 5784) ?Weight:  [133.4 kg-134.4 kg] 134.4 kg (04/07 2308) ?Weight change:  ?  ? ?Intake/Output from previous day: ?04/07 0701 - 04/08 0700 ?In: 1385.2 [I.V.:385.2; IV Piggyback:1000] ?Out: 100 [Emesis/NG output:100] ?Intake/Output this shift: ?No intake/output data recorded. ? ?General appearance: alert and no distress ?Resp: clear to auscultation bilaterally ?Cardio: regular rate and rhythm, S1, S2 normal, no murmur, click, rub or gallop ?GI: normal findings: habitus limits full exam and she is recently post-op, faint BS auscultated  upper R and L quadrants ? ?Lab Results: ?Recent Labs  ?  12/08/21 ?1836 12/09/21 ?0434  ?WBC 14.8* 14.8*  ?HGB 12.5 11.3*  ?HCT 40.0 36.2  ?PLT 350 331  ? ?BMET ?Recent Labs  ?  12/08/21 ?1836 12/09/21 ?0434  ?NA 138 141  ?K 4.4 3.9  ?CL 105 109  ?CO2 24 24  ?GLUCOSE 182* 108*  ?BUN 18 14  ?CREATININE 0.93 0.81  ?CALCIUM 8.9 8.4*  ? ? ?Studies/Results: ?CT ABDOMEN PELVIS W CONTRAST ? ?Result Date: 12/08/2021 ?CLINICAL DATA:  Abdominal pain following robotic assisted laparoscopic total hysterectomy 2 days ago. EXAM: CT ABDOMEN AND PELVIS WITH CONTRAST TECHNIQUE: Multidetector CT imaging of the abdomen and pelvis was performed using the standard protocol following bolus administration of intravenous contrast. RADIATION DOSE REDUCTION: This exam was performed according to the departmental dose-optimization program which includes automated exposure control, adjustment of the mA and/or kV according to patient size and/or use of iterative reconstruction technique. CONTRAST:  174m OMNIPAQUE IOHEXOL 300 MG/ML  SOLN COMPARISON:  CT abdomen and pelvis 11/21/2021. FINDINGS: Lower chest: The lung bases are  clear without focal nodule, mass, or airspace disease. Heart is mildly enlarged. Coronary artery calcifications are present. No significant pleural or pericardial effusion is present. Hepatobiliary: No focal liver abnormality is seen. Status post cholecystectomy. No biliary dilatation. Pancreas: Unremarkable. No pancreatic ductal dilatation or surrounding inflammatory changes. Spleen: Normal in size without focal abnormality. Adrenals/Urinary Tract: Adrenal glands are unremarkable. Kidneys are normal, without renal calculi, focal lesion, or hydronephrosis. Bladder is unremarkable. Stomach/Bowel: Stomach is within normal limits. Duodenal diverticulum again noted without focal inflammation. Proximal small bowel is dilated with fluid levels. Transition point noted at the umbilical hernia which now contains small bowel. The more distal small bowel is collapsed. Ascending transverse colon are within normal limits. Descending and sigmoid colon are mostly collapsed. Vascular/Lymphatic: No significant vascular findings are present. No enlarged abdominal or pelvic lymph nodes. Reproductive: Status post hysterectomy and bilateral salpingo oophorectomy. Other: Gas is now present within the paraumbilical hernia, likely postop. No other significant intraperitoneal gas is present following surgery. The extent of fat within the paraumbilical hernia is stable with the new addition of the incarcerated bowel. Extensive stranding is present in the soft tissues surrounding the paraumbilical hernia. No new ventral hernias are present. Musculoskeletal: No acute or significant osseous findings. IMPRESSION: 1. High-grade small bowel obstruction secondary incarcerated bowel within the previously noted paraumbilical hernia. The loop of bowel in the hernia is new in the source of obstruction. 2. Status post hysterectomy and bilateral salpingo  oophorectomy. 3. Coronary artery disease. These results were called by telephone at the time of  interpretation on 12/08/2021 at 7:55 pm to provider ADAM CURATOLO , who verbally acknowledged these results. Electronically Signed   By: San Morelle M.D.   On: 12/08/2021 20:03  ? ?DG Abd Portable 1 View ? ?Resul

## 2021-12-09 NOTE — Progress Notes (Signed)
CPAP was st up and the Pt is going to put it on when she is ready to go to bed. RT will continue to monitor ?

## 2021-12-09 NOTE — Assessment & Plan Note (Signed)
>   20 units twice daily and SSI at home ?- 12 units twice daily and SSI ?

## 2021-12-09 NOTE — Anesthesia Preprocedure Evaluation (Addendum)
Anesthesia Evaluation  ?Patient identified by MRN, date of birth, ID band ?Patient awake ? ? ? ?Reviewed: ?Allergy & Precautions, NPO status , Patient's Chart, lab work & pertinent test results ? ?History of Anesthesia Complications ?Negative for: history of anesthetic complications ? ?Airway ?Mallampati: IV ? ?TM Distance: >3 FB ?Neck ROM: Full ? ?Mouth opening: Limited Mouth Opening ?Comment: Extremely small mouth opening, round face Dental ?no notable dental hx. ?(+) Dental Advisory Given ?  ?Pulmonary ?sleep apnea and Continuous Positive Airway Pressure Ventilation , former smoker,  ?Home O2 due to right heart failure ?  ?Pulmonary exam normal ? ? ? ? ? ? ? Cardiovascular ?hypertension, Pt. on medications ?+ CAD, + Past MI, + Cardiac Stents (PCI 2010, DES to LAD 2021s/p DAPT x 1 year) and +CHF  ?Normal cardiovascular exam ? ?Myoview ??  The study is normal. The study is low risk. ??  No ST deviation was noted. ??  LV perfusion is normal. ??  Left ventricular function is normal. Nuclear stress EF: 71 %. The left ventricular ejection fraction is hyperdynamic (>65%). End diastolic cavity size is normal. End systolic cavity size is normal. ??  Prior study not available for comparison. ?? ?Normal perfusion. LVEF 71% with normal wall motion. This is a low risk study. No prior for comparison. ? ? ?Echo 02/08/2021 ?1. Left ventricular ejection fraction, by estimation, is 60 to 65%. The  ?left ventricle has normal function. The left ventricle has no regional  ?wall motion abnormalities.  ??2. Right ventricular systolic function is moderately reduced. The right  ?ventricular size is mildly enlarged.  ??3. The mitral valve is normal in structure. No evidence of mitral valve  ?regurgitation. No evidence of mitral stenosis.  ??4. The aortic valve is normal in structure. Aortic valve regurgitation is  ?not visualized. No aortic stenosis is present.  ??5. The inferior vena cava is normal in size  with greater than 50%  ?respiratory variability, suggesting right atrial pressure of 3 mmHg. ? ?Cath 2021: ?1. Acute anterior STEMI secondary to occlusion of the mid LAD ?2. Successful PTCA/DES x 1 mid LAD (overlapping with old stent) ?3. 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 There is TIMI-3 flow down the Diagonal branch.  ?4. The Circumflex is a small caliber vessel with mild non-obstructive plaque ?5. The RCA is a large dominant vessel with no obstructive disease ? ?  ?Neuro/Psych ?PSYCHIATRIC DISORDERS Anxiety Depression negative neurological ROS ?   ? GI/Hepatic ?negative GI ROS, Neg liver ROS,   ?Endo/Other  ?diabetes, Well Controlled, Type 2, Insulin Dependent, Oral Hypoglycemic AgentsMorbid obesityBMI 48 ?a1c 7.3 ? Renal/GU ?negative Renal ROS  ?negative genitourinary ?  ?Musculoskeletal ? ?(+) Arthritis ,  ? Abdominal ?  ?Peds ? Hematology ? ?(+) Blood dyscrasia, anemia ,   ?Anesthesia Other Findings ? ? Reproductive/Obstetrics ?Uterine ca s/p robotic TAHBSO, where omentum stuck in an umbilical hernia was noted. Now presents back to ED w/ N/V/abd pain and scans are showing bowel incarcerated in umbilical hernia  ? ?  ? ? ? ? ? ? ? ? ? ? ? ? ? ?  ?  ? ? ? ? ? ? ? ?Anesthesia Physical ?Anesthesia Plan ? ?ASA: 3 ? ?Anesthesia Plan: General  ? ?Post-op Pain Management: Ofirmev IV (intra-op)*, Dilaudid IV, Ketamine IV* and Toradol IV (intra-op)*  ? ?Induction: Intravenous ? ?PONV Risk Score and Plan: 4 or greater and Ondansetron, Dexamethasone, Midazolam and Treatment may vary due to age or  medical condition ? ?Airway Management Planned: Oral ETT and Video Laryngoscope Planned ? ?Additional Equipment: None ? ?Intra-op Plan:  ? ?Post-operative Plan: Extubation in OR ? ?Informed Consent: I have reviewed the patients History and Physical, chart, labs and discussed the procedure including the risks, benefits and alternatives for the proposed anesthesia with the patient or authorized  representative who has indicated his/her understanding and acceptance.  ? ? ? ?Dental advisory given ? ?Plan Discussed with: CRNA ? ?Anesthesia Plan Comments: (Airway note from surgery 3d ago indicates 2 handed mask ventilation but grade 1 view with DL Mac 3 blade and 1 attempt. Based on her small mouth opening and habitus, I would be surprised if her airway is easy- we will go directly to glidescope in OR, RSI.  ? ?Still has active type and screen until midnight tonight, will send another sample in OR ?Access: 20G L AC, 22G R forearm- will place additional peripherals in OR )  ? ? ? ? ?Anesthesia Quick Evaluation ? ?

## 2021-12-10 DIAGNOSIS — K56609 Unspecified intestinal obstruction, unspecified as to partial versus complete obstruction: Secondary | ICD-10-CM | POA: Diagnosis not present

## 2021-12-10 LAB — GLUCOSE, CAPILLARY
Glucose-Capillary: 117 mg/dL — ABNORMAL HIGH (ref 70–99)
Glucose-Capillary: 131 mg/dL — ABNORMAL HIGH (ref 70–99)
Glucose-Capillary: 141 mg/dL — ABNORMAL HIGH (ref 70–99)
Glucose-Capillary: 171 mg/dL — ABNORMAL HIGH (ref 70–99)
Glucose-Capillary: 212 mg/dL — ABNORMAL HIGH (ref 70–99)
Glucose-Capillary: 245 mg/dL — ABNORMAL HIGH (ref 70–99)
Glucose-Capillary: 284 mg/dL — ABNORMAL HIGH (ref 70–99)
Glucose-Capillary: 286 mg/dL — ABNORMAL HIGH (ref 70–99)

## 2021-12-10 LAB — BASIC METABOLIC PANEL
Anion gap: 12 (ref 5–15)
BUN: 15 mg/dL (ref 6–20)
CO2: 21 mmol/L — ABNORMAL LOW (ref 22–32)
Calcium: 8.3 mg/dL — ABNORMAL LOW (ref 8.9–10.3)
Chloride: 102 mmol/L (ref 98–111)
Creatinine, Ser: 0.91 mg/dL (ref 0.44–1.00)
GFR, Estimated: >60 mL/min (ref >60)
Glucose, Bld: 168 mg/dL — ABNORMAL HIGH (ref 70–99)
Potassium: 4 mmol/L (ref 3.5–5.1)
Sodium: 135 mmol/L (ref 135–145)

## 2021-12-10 LAB — CBC
HCT: 35.9 % — ABNORMAL LOW (ref 36.0–46.0)
Hemoglobin: 10.6 g/dL — ABNORMAL LOW (ref 12.0–15.0)
MCH: 25.7 pg — ABNORMAL LOW (ref 26.0–34.0)
MCHC: 29.5 g/dL — ABNORMAL LOW (ref 30.0–36.0)
MCV: 86.9 fL (ref 80.0–100.0)
Platelets: 358 10*3/uL (ref 150–400)
RBC: 4.13 MIL/uL (ref 3.87–5.11)
RDW: 16.7 % — ABNORMAL HIGH (ref 11.5–15.5)
WBC: 18.5 10*3/uL — ABNORMAL HIGH (ref 4.0–10.5)
nRBC: 0.3 % — ABNORMAL HIGH (ref 0.0–0.2)

## 2021-12-10 LAB — SAMPLE TO BLOOD BANK

## 2021-12-10 MED ORDER — CHLORHEXIDINE GLUCONATE CLOTH 2 % EX PADS
6.0000 | MEDICATED_PAD | Freq: Every day | CUTANEOUS | Status: DC
  Administered 2021-12-10: 6 via TOPICAL

## 2021-12-10 NOTE — Plan of Care (Signed)

## 2021-12-10 NOTE — Progress Notes (Signed)
? ? ?PROGRESS NOTE ? ?Patient: Brenda [name] 61 y.o. female ?MRN: [id number] ? ?Today is hospital day 1 after presenting to ED on 12/08/2021  6:21 PM with  ?Chief Complaint  ?Patient presents with  ? Abdominal Pain  ? ?Brenda [name] is a 61 y.o. female with medical history significant of hyperlipidemia, diabetes, hypertension, CAD, CHF, OSA, anxiety, depression, chronic respiratory failure, endometrial cancer status post hysterectomy and salpingo-oophorectomy 12/07/21 presenting with abdominal pain. ? ?RECORD REVIEW AND HOSPITAL COURSE: ?12/08/21: to ED as above, CT abdomen pelvis showing high-grade SBO with incarcerated bowel at umbilical hernia.  Status post hysterectomy and oophorectomy. Abdominal film performed showing NG tube in place.  Patient received fentanyl, Zofran, liter fluids in the ED.  General surgery consulted, unable to see patient immediately d/t emergent cases as [facility], recommend NG tube and check lactic acid. Hernia previously containing fat, thought process is during surgery at this that likely was freed and the opportunity presented for bowel to become contained within hernia ?12/09/21: surgery w/ laparoscopic repair of incarcerated umbilical hernia  ?53/29/92: pain controlled but no flatus/BM yet, tolerating some po intake. Surgery ok for discharge once pt tolerating po, passing flatus, pain controlled. May be able to d/c home later today or possibly tomorrow pending improvement in those areas.  ? ?Procedures and Significant Results:  ?12/09/21 Laparoscopic repair incarcerated umbilical hernia with mesh ? ?Consultants:  ?General Surgery  ? ? ? ?SUBJECTIVE today 12/10/21:  ?Patient seen and examined sitting up in chair at bedside today, no apparent distress.  She reports pain is controlled, not passing flatus/BM yet, advancing diet somewhat.  ? ? ? ? ?ASSESSMENT & PLAN ? ?SBO (small bowel obstruction) ([place]) ?> Patient presenting with abdominal pain and constipation with no flatus is in the  setting of recent hysterectomy and bilateral salpingo-oophorectomy 2 days prior to admission, done for endometrial cancer. ?> CT of the abdomen pelvis showed high-grade SBO with incarcerated bowel at known periumbilical hernia. Hernia previously containing fat, thought process is during surgery at this that likely was freed and the opportunity presented for bowel to become contained within hernia. ?> General surgery consulted, NG placed 12/08/21, laparoscopy repair incarcerated hernia 12/09/21 --> surgery went well, see op note  ?- Advance diet as tolerated per surgery  ?- As needed pain control ?- NG tube out ?-improving 12/10/21 Surgery ok for discharge once pt tolerating po, passing flatus, pain controlled. May be able to d/c home later today or possibly tomorrow pending improvement in those areas.  ? ? ?Endometrial cancer ([place]) ?> Status post hysterectomy and bilateral salpingo-oophorectomy as above. ?- Continue to monitor ?- Appreciate gynecology oncology recommendations ? ?(HFpEF) heart failure with preserved ejection fraction ([place]) ?> Last echo in 2022 with EF 60-65%, moderately increased RV function. ?> On 2 L chronic oxygen. ?- Provided is able to tolerate p.o. we will continue with Lasix, Entresto, Jardiance ?-Gentle IV fluids mainteined overnight 4/7-8/23 and 5 cc an hour, did receive a liter in the ED ? ?Essential hypertension ?See above re CHF ? ?Hyperlipidemia associated with type 2 diabetes mellitus ([place]) ?> Status post multiple stenting ?- Continue home aspirin, atorvastatin when tolerating p.o. ? ?Coronary Artery Disease ?Continue statin, ASA when able to tolerate po  ? ?OSA treated with BiPAP ?Holding BiPap/CPAP while NG in place  ?BiPap to resume tonight 12/09/21 ? ? ?Type 2 diabetes mellitus with complication, with long-term current use of insulin ([facility]) ?> 20 units twice daily and SSI at home ?- 12 units  twice daily and SSI ? ?Chronic respiratory failure ([place]) ?See above re: CHF ? ?Anxiety and  depression ?Resume SSRI when tolerating po  ? ?  ? ? ?VTE Ppx: SCD pending surgery  ?CODE STATUS: FULL ?Admitted from: home ?Expected Dispo: home ?Barriers to discharge: surgery planned for today 12/09/21  ? ? ? ? ? ? ? ? ? ? ? ? ? ?Objective: ?Vital signs in last 24 hours: ?Temp:  [98 ?F (36.7 ?C)-99.1 ?F (37.3 ?C)] 98.3 ?F (36.8 ?C) (04/09 1115) ?Pulse Rate:  [82-103] 92 (04/09 1115) ?Resp:  [12-23] 20 (04/09 1115) ?BP: (110-161)/(52-68) 125/60 (04/09 1115) ?SpO2:  [91 %-100 %] 100 % (04/09 1115) ?Weight:  [134.5 kg] 134.5 kg (04/09 0352) ?Weight change: 1.137 kg ?Last BM Date : 12/10/21 ? ?Intake/Output from previous day: ?04/08 0701 - 04/09 0700 ?In: 800 [I.V.:700; IV Piggyback:100] ?Out: 1620 [Urine:1600; Blood:20] ?Intake/Output this shift: ?No intake/output data recorded. ? ?General appearance: alert and no distress ?Resp: clear to auscultation bilaterally ?Cardio: regular rate and rhythm, S1, S2 normal, no murmur, click, rub or gallop ?GI: normal findings: habitus limits full exam and she is recently post-op, faint BS auscultated  upper R and L quadrants ? ?Lab Results: ?Recent Labs  ?  12/09/21 ?9562 12/10/21 ?1308  ?WBC 14.8* 18.5*  ?HGB 11.3* 10.6*  ?HCT 36.2 35.9*  ?PLT 331 358  ? ? ?BMET ?Recent Labs  ?  12/09/21 ?0434 12/10/21 ?0849  ?NA 141 135  ?K 3.9 4.0  ?CL 109 102  ?CO2 24 21*  ?GLUCOSE 108* 168*  ?BUN 14 15  ?CREATININE 0.81 0.91  ?CALCIUM 8.4* 8.3*  ? ? ? ?Studies/Results: ?CT ABDOMEN PELVIS W CONTRAST ? ?Result Date: 12/08/2021 ?CLINICAL DATA:  Abdominal pain following robotic assisted laparoscopic total hysterectomy 2 days ago. EXAM: CT ABDOMEN AND PELVIS WITH CONTRAST TECHNIQUE: Multidetector CT imaging of the abdomen and pelvis was performed using the standard protocol following bolus administration of intravenous contrast. RADIATION DOSE REDUCTION: This exam was performed according to the departmental dose-optimization program which includes automated exposure control, adjustment of the mA  and/or kV according to patient size and/or use of iterative reconstruction technique. CONTRAST:  180m OMNIPAQUE IOHEXOL 300 MG/ML  SOLN COMPARISON:  CT abdomen and pelvis 11/21/2021. FINDINGS: Lower chest: The lung bases are clear without focal nodule, mass, or airspace disease. Heart is mildly enlarged. Coronary artery calcifications are present. No significant pleural or pericardial effusion is present. Hepatobiliary: No focal liver abnormality is seen. Status post cholecystectomy. No biliary dilatation. Pancreas: Unremarkable. No pancreatic ductal dilatation or surrounding inflammatory changes. Spleen: Normal in size without focal abnormality. Adrenals/Urinary Tract: Adrenal glands are unremarkable. Kidneys are normal, without renal calculi, focal lesion, or hydronephrosis. Bladder is unremarkable. Stomach/Bowel: Stomach is within normal limits. Duodenal diverticulum again noted without focal inflammation. Proximal small bowel is dilated with fluid levels. Transition point noted at the umbilical hernia which now contains small bowel. The more distal small bowel is collapsed. Ascending transverse colon are within normal limits. Descending and sigmoid colon are mostly collapsed. Vascular/Lymphatic: No significant vascular findings are present. No enlarged abdominal or pelvic lymph nodes. Reproductive: Status post hysterectomy and bilateral salpingo oophorectomy. Other: Gas is now present within the paraumbilical hernia, likely postop. No other significant intraperitoneal gas is present following surgery. The extent of fat within the paraumbilical hernia is stable with the new addition of the incarcerated bowel. Extensive stranding is present in the soft tissues surrounding the paraumbilical hernia. No new ventral hernias are present. Musculoskeletal: No  acute or significant osseous findings. IMPRESSION: 1. High-grade small bowel obstruction secondary incarcerated bowel within the previously noted paraumbilical  hernia. The loop of bowel in the hernia is new in the source of obstruction. 2. Status post hysterectomy and bilateral salpingo oophorectomy. 3. Coronary artery disease. These results were called by teleph

## 2021-12-10 NOTE — Progress Notes (Signed)
1 Day Post-Op  ? ?Subjective/Chief Complaint: ?Complains of incisional pain but otherwise feels much better ?Tolerating liquids ?Has not passed flatus yet ? ? ?Objective: ?Vital signs in last 24 hours: ?Temp:  [98 ?F (36.7 ?C)-99.1 ?F (37.3 ?C)] 99.1 ?F (37.3 ?C) (04/09 0352) ?Pulse Rate:  [82-103] 102 (04/09 0352) ?Resp:  [12-23] 20 (04/09 0352) ?BP: (110-161)/(52-68) 161/64 (04/09 0352) ?SpO2:  [91 %-100 %] 93 % (04/09 0352) ?Weight:  [134.5 kg] 134.5 kg (04/09 0352) ?Last BM Date : 12/04/21 ? ?Intake/Output from previous day: ?04/08 0701 - 04/09 0700 ?In: 800 [I.V.:700; IV Piggyback:100] ?Out: 1620 [Urine:1600; Blood:20] ?Intake/Output this shift: ?No intake/output data recorded. ? ?Exam: ?Awake and alert ?Abdomen soft, minimally tender, incisions clean ? ?Lab Results:  ?Recent Labs  ?  12/08/21 ?1836 12/09/21 ?0434  ?WBC 14.8* 14.8*  ?HGB 12.5 11.3*  ?HCT 40.0 36.2  ?PLT 350 331  ? ?BMET ?Recent Labs  ?  12/08/21 ?1836 12/09/21 ?0434  ?NA 138 141  ?K 4.4 3.9  ?CL 105 109  ?CO2 24 24  ?GLUCOSE 182* 108*  ?BUN 18 14  ?CREATININE 0.93 0.81  ?CALCIUM 8.9 8.4*  ? ?PT/INR ?No results for input(s): LABPROT, INR in the last 72 hours. ?ABG ?No results for input(s): PHART, HCO3 in the last 72 hours. ? ?Invalid input(s): PCO2, PO2 ? ?Studies/Results: ?CT ABDOMEN PELVIS W CONTRAST ? ?Result Date: 12/08/2021 ?CLINICAL DATA:  Abdominal pain following robotic assisted laparoscopic total hysterectomy 2 days ago. EXAM: CT ABDOMEN AND PELVIS WITH CONTRAST TECHNIQUE: Multidetector CT imaging of the abdomen and pelvis was performed using the standard protocol following bolus administration of intravenous contrast. RADIATION DOSE REDUCTION: This exam was performed according to the departmental dose-optimization program which includes automated exposure control, adjustment of the mA and/or kV according to patient size and/or use of iterative reconstruction technique. CONTRAST:  132m OMNIPAQUE IOHEXOL 300 MG/ML  SOLN COMPARISON:  CT  abdomen and pelvis 11/21/2021. FINDINGS: Lower chest: The lung bases are clear without focal nodule, mass, or airspace disease. Heart is mildly enlarged. Coronary artery calcifications are present. No significant pleural or pericardial effusion is present. Hepatobiliary: No focal liver abnormality is seen. Status post cholecystectomy. No biliary dilatation. Pancreas: Unremarkable. No pancreatic ductal dilatation or surrounding inflammatory changes. Spleen: Normal in size without focal abnormality. Adrenals/Urinary Tract: Adrenal glands are unremarkable. Kidneys are normal, without renal calculi, focal lesion, or hydronephrosis. Bladder is unremarkable. Stomach/Bowel: Stomach is within normal limits. Duodenal diverticulum again noted without focal inflammation. Proximal small bowel is dilated with fluid levels. Transition point noted at the umbilical hernia which now contains small bowel. The more distal small bowel is collapsed. Ascending transverse colon are within normal limits. Descending and sigmoid colon are mostly collapsed. Vascular/Lymphatic: No significant vascular findings are present. No enlarged abdominal or pelvic lymph nodes. Reproductive: Status post hysterectomy and bilateral salpingo oophorectomy. Other: Gas is now present within the paraumbilical hernia, likely postop. No other significant intraperitoneal gas is present following surgery. The extent of fat within the paraumbilical hernia is stable with the new addition of the incarcerated bowel. Extensive stranding is present in the soft tissues surrounding the paraumbilical hernia. No new ventral hernias are present. Musculoskeletal: No acute or significant osseous findings. IMPRESSION: 1. High-grade small bowel obstruction secondary incarcerated bowel within the previously noted paraumbilical hernia. The loop of bowel in the hernia is new in the source of obstruction. 2. Status post hysterectomy and bilateral salpingo oophorectomy. 3. Coronary  artery disease. These results were called by telephone  at the time of interpretation on 12/08/2021 at 7:55 pm to provider ADAM CURATOLO , who verbally acknowledged these results. Electronically Signed   By: San Morelle M.D.   On: 12/08/2021 20:03  ? ?DG Abd Portable 1 View ? ?Result Date: 12/08/2021 ?CLINICAL DATA:  NG tube placement EXAM: PORTABLE ABDOMEN - 1 VIEW COMPARISON:  None. FINDINGS: Esophageal tube tip overlies the distal stomach. Elevation of right diaphragm. IMPRESSION: Esophageal tube tip overlies the distal stomach. Electronically Signed   By: Donavan Foil M.D.   On: 12/08/2021 21:25   ? ?Anti-infectives: ?Anti-infectives (From admission, onward)  ? ? Start     Dose/Rate Route Frequency Ordered Stop  ? 12/09/21 0942  sodium chloride 0.9 % with cefTRIAXone (ROCEPHIN) ADS Med       ?Note to Pharmacy: Nunzio Cobbs : cabinet override  ?    12/09/21 0942 12/09/21 2144  ? 12/09/21 0942  sodium chloride 0.9 % with cefTRIAXone (ROCEPHIN) ADS Med       ?Note to Pharmacy: Nunzio Cobbs : cabinet override  ?    12/09/21 0942 12/09/21 2144  ? 12/09/21 0800  ceFAZolin (ANCEF) IVPB 3g/100 mL premix       ? 3 g ?200 mL/hr over 30 Minutes Intravenous On call to O.R. 12/09/21 0750 12/09/21 1058  ? ?  ? ? ?Assessment/Plan: ?S/p laparoscopic repair of incarcerated umbilical hernia with mesh ? ?POD#1 ? ?D/c foley ?Ambulate ?Advance diet ?Ok from surgery to go home later today if tolerating po, voiding , passing flatus and pain controlled.  Will see again tomorrow if still here ? ? ?Coralie Keens MD ?12/10/2021 ? ?

## 2021-12-10 NOTE — TOC Initial Note (Signed)
Transition of Care (TOC) - Initial/Assessment Note  ? ? ?Patient Details  ?Name: Brenda [name] ?MRN: [id number] ?Date of Birth: 1961/03/25 ? ?Transition of Care (TOC) CM/SW Contact:    ?Tawanna Cooler, RN ?Phone Number: ?12/10/2021, 2:01 PM ? ?Clinical Narrative:                 ? ? ?Transition of Care Department (TOC) has reviewed patient and no TOC needs have been identified at this time. We will continue to monitor patient advancement through interdisciplinary progression rounds. If new patient transition needs arise, please place a TOC consult. ? ? ?Expected Discharge Plan: Home/Self Care ?Barriers to Discharge: Continued Medical Work up ? ? ? ?Expected Discharge Plan and Services ?Expected Discharge Plan: Home/Self Care ?  ?  ?Living arrangements for the past 2 months: [place] ?                ? Prior Living Arrangements/Services ?Living arrangements for the past 2 months: [place] ?Lives with:: Spouse ?Patient language and need for interpreter reviewed:: Yes ?       ?Need for Family Participation in Patient Care: Yes (Comment) ?Care giver support system in place?: Yes (comment) ?  ?Criminal Activity/Legal Involvement Pertinent to Current Situation/Hospitalization: No - Comment as needed ? ?Activities of Daily Living ?Home Assistive Devices/Equipment: Gilford Rile (specify type) ?ADL Screening (condition at time of admission) ?Patient's cognitive ability adequate to safely complete daily activities?: Yes ?Is the patient deaf or have difficulty hearing?: No ?Does the patient have difficulty seeing, even when wearing glasses/contacts?: No ?Does the patient have difficulty concentrating, remembering, or making decisions?: No ?Patient able to express need for assistance with ADLs?: Yes ?Does the patient have difficulty dressing or bathing?: No ?Independently performs ADLs?: Yes (appropriate for developmental age) ?Does the patient have difficulty walking or climbing stairs?: Yes ?Weakness of  Legs: Both ?Weakness of Arms/Hands: None ? ?Emotional Assessment ? Alcohol / Substance Use: Not Applicable ?Psych Involvement: No (comment) ? ?Admission diagnosis:  SBO (small bowel obstruction) ([place]) [K56.609] ?Small bowel obstruction ([place]) [K56.609] ?Patient Active Problem List  ? Diagnosis Date Noted  ? Small bowel obstruction ([place]) 12/09/2021  ? SBO (small bowel obstruction) ([place]) 12/08/2021  ? Endometrial adenocarcinoma ([place]) 12/06/2021  ? Endometrial cancer ([place])   ? Nonspecific chest pain 10/19/2021  ? Dysfunctional uterine bleeding   ? Symptomatic anemia   ? Chronic respiratory failure ([place]) 03/22/2021  ? (HFpEF) heart failure with preserved ejection fraction ([place]) 02/09/2021  ? Old MI (myocardial infarction) 10/26/2019  ? Anxiety and depression 03/28/2016  ? Type 2 diabetes mellitus with complication, with long-term current use of insulin ([place]) 02/23/2015  ? Morbid obesity ([place]) 02/22/2015  ? Dyspnea 03/11/2013  ? Hyperlipidemia associated with type 2 diabetes mellitus ([address]) 05/25/2009  ? Coronary Artery Disease 05/25/2009  ? Essential hypertension 05/24/2009  ? OSA treated with BiPAP 05/24/2009  ? ?PCP:  Charlott Rakes, MD ?Pharmacy:   ?[facility] [address], [address] ?[place] [address] ?Phone: [phone] Fax: [phone] ? ? ? ?Readmission Risk Interventions ? ?  10/28/2019  ?  2:14 PM  ?Readmission Risk Prevention Plan  ?Post Dischage Appt Complete  ?Medication Screening Complete  ?Transportation Screening Complete  ? ? ? ?

## 2021-12-10 NOTE — Discharge Instructions (Signed)
[facility] _______[facility] ? ?UMBILICAL OR INGUINAL HERNIA REPAIR: POST OP INSTRUCTIONS ? ?Always review your discharge instruction sheet given to you by the facility where your surgery was performed. ?IF YOU HAVE DISABILITY OR FAMILY LEAVE FORMS, YOU MUST BRING THEM TO THE OFFICE FOR PROCESSING.   ?DO NOT GIVE THEM TO YOUR DOCTOR. ? ?1. A  prescription for pain medication may be given to you upon discharge.  Take your pain medication as prescribed, if needed.  If narcotic pain medicine is not needed, then you may take acetaminophen (Tylenol) or ibuprofen (Advil) as needed. ?2. Take your usually prescribed medications unless otherwise directed. ?If you need a refill on your pain medication, please contact your pharmacy.  They will contact our office to request authorization. Prescriptions will not be filled after 5 pm or on week-ends. ?3. You should follow a light diet the first 24 hours after arrival home, such as soup and crackers, etc.  Be sure to include lots of fluids daily.  Resume your normal diet the day after surgery. ?4.Most patients will experience some swelling and bruising around the umbilicus or in the groin and scrotum.  Ice packs and reclining will help.  Swelling and bruising can take several days to resolve.  ?6. It is common to experience some constipation if taking pain medication after surgery.  Increasing fluid intake and taking a stool softener (such as Colace) will usually help or prevent this problem from occurring.  A mild laxative (Milk of Magnesia or Miralax) should be taken according to package directions if there are no bowel movements after 48 hours. ?7. Unless discharge instructions indicate otherwise, you may remove your bandages 24-48 hours after surgery, and you may shower at that time.  You may have steri-strips (small skin tapes) in place directly over the incision.  These strips should be left on the skin for 7-10 days.  If your surgeon used skin glue on the  incision, you may shower in 24 hours.  The glue will flake off over the next 2-3 weeks.  Any sutures or staples will be removed at the office during your follow-up visit. ?8. ACTIVITIES:  You may resume regular (light) daily activities beginning the next day--such as daily self-care, walking, climbing stairs--gradually increasing activities as tolerated.  You may have sexual intercourse when it is comfortable.  Refrain from any heavy lifting or straining until approved by your doctor. ? ?a.You may drive when you are no longer taking prescription pain medication, you can comfortably wear a seatbelt, and you can safely maneuver your car and apply brakes. ?b.RETURN TO WORK:   ?_____________________________________________ ? ?9.You should see your doctor in the office for a follow-up appointment approximately 2-3 weeks after your surgery.  Make sure that you call for this appointment within a day or two after you arrive home to insure a convenient appointment time. ?10.OTHER INSTRUCTIONS: __OK TO SHOWER ?NO LIFTING MORE THAN 15 TO 20 POUNDS FOR 6 WEEKS ?_______________________ ?   _____________________________________ ? ?WHEN TO CALL YOUR DOCTOR: ?Fever over 101.0 ?Inability to urinate ?Nausea and/or vomiting ?Extreme swelling or bruising ?Continued bleeding from incision. ?Increased pain, redness, or drainage from the incision ? ?The clinic staff is available to answer your questions during regular business hours.  Please don?t hesitate to call and ask to speak to one of the nurses for clinical concerns.  If you have a medical emergency, go to the nearest emergency room or call 911.  A surgeon from [facility] is  always on call at the hospital ? ? ?[address], [place], [place], [place]  [address] ? ? [address], [place], [place]   [address] ?[phone] ? [phone] ? FAX [phone] ?Web site: [email or web address]  ?

## 2021-12-11 ENCOUNTER — Encounter (HOSPITAL_COMMUNITY): Payer: Self-pay | Admitting: Surgery

## 2021-12-11 ENCOUNTER — Other Ambulatory Visit: Payer: Self-pay

## 2021-12-11 DIAGNOSIS — K56609 Unspecified intestinal obstruction, unspecified as to partial versus complete obstruction: Secondary | ICD-10-CM | POA: Diagnosis not present

## 2021-12-11 LAB — CBC
HCT: 34.1 % — ABNORMAL LOW (ref 36.0–46.0)
Hemoglobin: 10.3 g/dL — ABNORMAL LOW (ref 12.0–15.0)
MCH: 26 pg (ref 26.0–34.0)
MCHC: 30.2 g/dL (ref 30.0–36.0)
MCV: 86.1 fL (ref 80.0–100.0)
Platelets: 336 10*3/uL (ref 150–400)
RBC: 3.96 MIL/uL (ref 3.87–5.11)
RDW: 16.9 % — ABNORMAL HIGH (ref 11.5–15.5)
WBC: 16.9 10*3/uL — ABNORMAL HIGH (ref 4.0–10.5)
nRBC: 0 % (ref 0.0–0.2)

## 2021-12-11 LAB — GLUCOSE, CAPILLARY
Glucose-Capillary: 145 mg/dL — ABNORMAL HIGH (ref 70–99)
Glucose-Capillary: 167 mg/dL — ABNORMAL HIGH (ref 70–99)
Glucose-Capillary: 184 mg/dL — ABNORMAL HIGH (ref 70–99)

## 2021-12-11 MED ORDER — TRAMADOL HCL 50 MG PO TABS: 50.0000 mg | ORAL_TABLET | Freq: Four times a day (QID) | ORAL | 0 refills | Status: AC | PRN

## 2021-12-11 NOTE — Progress Notes (Signed)
GYN Oncology Progress Note ? ?Patient in the bathroom having a bowel movement. States she is tolerating a soft diet with no nausea or emesis. Passing flatus. Ambulating without difficulty. Breathing is at baseline. Intermittent abdominal pain which she relates to needing to have bowel movement. Feels like she is ready to go home. Dr. Berline Lopes will plan to call the patient with her final path when available. Follow up appt in the office for April 24 adjusted per pt request. No needs voiced.  ? ?Very much appreciate general surgery evaluation and management. ?

## 2021-12-11 NOTE — Discharge Summary (Signed)
Physician Discharge Summary  ?Brenda [name] YHC:[id number] DOB: Nov 10, 1960 DOA: 12/08/2021 ? ?PCP: Charlott Rakes, MD ? ?Admit date: 12/08/2021 ?Discharge date: 12/11/2021 ?Recommendations for Outpatient Follow-up:  ?Follow up with PCP in 1 weeks-call for appointment ?Please obtain BMP/CBC in one week ? ?Discharge Dispo: home ?Discharge Condition: Stable ?Code Status:   Code Status: Full Code ?Diet recommendation:  ?Diet Order   ? ?       ?  Diet - low sodium heart healthy       ?  ?  DIET SOFT Room service appropriate? Yes; Fluid consistency: Thin  Diet effective now       ?  ? ?  ?  ? ?  ?  ? ?Brief/Interim Summary: ?61 y.o. female with hyperlipidemia, diabetes, hypertension, CAD, CHF, OSA, anxiety, depression, chronic respiratory failure, endometrial cancer status post hysterectomy and salpingo-oophorectomy 12/07/21 presenting with abdominal pain on 12/08/21  to ED. ?CT abdomen pelvis showing high-grade SBO with incarcerated bowel at umbilical hernia.General surgery consulted, unable to see patient immediately d/t emergent cases as [facility], recommended NG tube. Hernia previously containing fat, thought process is during surgery at this that likely was freed and the opportunity presented for bowel to become contained within hernia ?12/09/21: surgery w/ laparoscopic repair of incarcerated umbilical hernia  ?51/76/16: pain controlled but no flatus/BM yet, tolerating some po intake. Surgery ok for discharge once pt tolerating po, passing flatus, pain controlled. ?Has had BM and tolerating diet. Seen by surgery this am and cleared for dc home. I asked her to get cbc checked in 1 wk.  ? ?Discharge Diagnoses:  ?Principal Problem: ?  SBO (small bowel obstruction) ([place]) ?Active Problems: ?  Hyperlipidemia associated with type 2 diabetes mellitus ([place]) ?  Essential hypertension ?  Coronary Artery Disease ?  OSA treated with BiPAP ?  Type 2 diabetes mellitus with complication, with long-term current use of insulin ([place]) ?   Anxiety and depression ?  (HFpEF) heart failure with preserved ejection fraction ([place]) ?  Chronic respiratory failure ([place]) ?  Endometrial cancer ([place]) ?  Small bowel obstruction ([place]) ? ?WVP:[id number] with abdominal pain and constipation with no flatus is in the setting of recent hysterectomy and bilateral salpingo-oophorectomy 2 days prior to admission, done for endometrial cancer. ?> CT of the abdomen pelvis showed high-grade SBO with incarcerated bowel at known periumbilical hernia. Hernia previously containing fat, thought process is during surgery at this that likely was freed and the opportunity presented for bowel to become contained within hernia. ?> General surgery consulted, NG placed 12/08/21, laparoscopy repair incarcerated hernia 12/09/21 --> surgery went well, see op note. ?At this time tolerating diet, seen by surgery and having bowel movement and cleared for discharge home today with pain medication.  ? ?Hyperlipidemia associated with type 2 diabetes mellitus Continue Lipitor ?Essential hypertension: ?CAD: ?BP stable.  No chest pain.  Continue  home  aspirin, Lipitor, Entresto, Lasix.Resume effient ? ?OSA treated with BiPAP ?Type 2 diabetes mellitus with complication, with long-term current use of insulin: Resume insulin regimen and adjust at home ?Anxiety and depression-currently stable on home SSRI ?(HFpEF) heart failure with preserved ejection fraction: Compensated on Entresto and Lasix. ?Chronic respiratory failure  ?Endometrial cancer history, from recent surgery and pathology still pending. ? ?Consults: ?Surgery  ?Subjective: ?Aaox3, no pain, had BM, no nausea or vomiting  ? ?Discharge Exam: ?Vitals:  ? 12/10/21 2145 12/11/21 0435  ?BP:  (!) 119/49  ?Pulse:  92  ?Resp: (!) 23 20  ?Temp:  99.1 ?  F (37.3 ?C)  ?SpO2:  99%  ? ?General: Pt is alert, awake, not in acute distress ?Cardiovascular: RRR, S1/S2 +, no rubs, no gallops ?Respiratory: CTA bilaterally, no wheezing, no rhonchi ?Abdominal:  Soft, NT, ND, bowel sounds + ?Extremities: no edema, no cyanosis ? ?Discharge Instructions ? ?Discharge Instructions   ? ? (HEART FAILURE PATIENTS) Call MD:  Anytime you have any of the following symptoms: 1) 3 pound weight gain in 24 hours or 5 pounds in 1 week 2) shortness of breath, with or without a dry hacking cough 3) swelling in the hands, feet or stomach 4) if you have to sleep on extra pillows at night in order to breathe.   Complete by: As directed ?  ? Diet - low sodium heart healthy   Complete by: As directed ?  ? Discharge instructions   Complete by: As directed ?  ? Pcp follow up in 1 wk for cbc bmp ? ?Follow with surgery as instructed ? ?Please call call MD or return to ER for similar or worsening recurring problem that brought you to hospital or if any fever,nausea/vomiting,abdominal pain, uncontrolled pain, chest pain,  shortness of breath or any other alarming symptoms. ? ?Please follow-up your doctor as instructed in a week time and call the office for appointment. ? ?Please avoid alcohol, smoking, or any other illicit substance and maintain healthy habits including taking your regular medications as prescribed. ? ?You were cared for by a hospitalist during your hospital stay. If you have any questions about your discharge medications or the care you received while you were in the hospital after you are discharged, you can call the unit and ask to speak with the hospitalist on call if the hospitalist that took care of you is not available. ? ?Once you are discharged, your primary care physician will handle any further medical issues. Please note that NO REFILLS for any discharge medications will be authorized once you are discharged, as it is imperative that you return to your primary care physician (or establish a relationship with a primary care physician if you do not have one) for your aftercare needs so that they can reassess your need for medications and monitor your lab values  ? Discharge  wound care:   Complete by: As directed ?  ? Keep clean dry and follow surgery recommendations  ? Increase activity slowly   Complete by: As directed ?  ? ?  ? ?Allergies as of 12/11/2021   ? ?   Reactions  ? Hydrocodone Shortness Of Breath  ? "I forget to breathe."   ? Clopidogrel Bisulfate Rash  ? ?  ? ?  ?Medication List  ?  ? ?TAKE these medications   ? ?Aspirin Low Dose 81 MG EC tablet ?Generic drug: aspirin ?Take 1 tablet (81 mg total) by mouth daily. ?  ?atorvastatin 80 MG tablet ?Commonly known as: LIPITOR ?Take 1 tablet (80 mg total) by mouth every evening. ?  ?Calcium-Magnesium-Zinc Tabs ?Take 1 tablet by mouth daily. ?  ?empagliflozin 10 MG Tabs tablet ?Commonly known as: JARDIANCE ?Take 1 tablet (10 mg total) by mouth daily. ?  ?Entresto 24-26 MG ?Generic drug: sacubitril-valsartan ?Take 1 tablet by mouth 2 (two) times daily. ?  ?FLUoxetine 20 MG capsule ?Commonly known as: PROzac ?Take 1 capsule (20 mg total) by mouth daily. ?  ?furosemide 40 MG tablet ?Commonly known as: LASIX ?Take 1 tablet (40 mg total) by mouth daily. ?  ?gabapentin 300 MG capsule ?Commonly known  as: NEURONTIN ?Take 2 capsules (600 mg total) by mouth at bedtime. ?  ?glucose blood test strip ?Commonly known as: True Metrix Blood Glucose Test ?Use as instructed ?What changed: when to take this ?  ?insulin aspart 100 UNIT/ML injection ?Commonly known as: novoLOG ?Inject 10-20 Units into the skin in the morning, at noon, and at bedtime. If BS<150=Take 10 units or If BS>150=Take 20 units ?  ?Insulin Pen Needle 30G X 8 MM Misc ?Commonly known as: NOVOFINE ?Use as directed with Novolin 70/30 insulin ?  ?Insulin Syringe-Needle U-100 30G X 1/2" 0.3 ML Misc ?use as directed 5 (five) times daily with insulin ?  ?Levemir FlexPen 100 UNIT/ML FlexPen ?Generic drug: insulin detemir ?Inject 22 Units into the skin 2 (two) times daily. ?What changed: how much to take ?  ?Midol Complete 500-60-15 MG Tabs ?Generic drug:  Acetaminophen-Caff-Pyrilamine ?Take 2 tablets by mouth daily as needed (pain/cramps). ?  ?Misc. Devices Misc ?Portable oxygen concentrator, 2L oxygen.  Diagnoses-chronic respiratory failure with hypoxia ?  ?nitroGLYCERIN 0.4 MG SL tablet ?Comm

## 2021-12-11 NOTE — Progress Notes (Signed)
2 Days Post-Op  ? ?Subjective/Chief Complaint: ?Reports some abdominal soreness that improves with PO pain meds. ?Tolerating SOFT diet ?BMx3 yesterday ?Mobilizing independently  ? ? ?Objective: ?Vital signs in last 24 hours: ?Temp:  [98.3 ?F (36.8 ?C)-99.1 ?F (37.3 ?C)] 99.1 ?F (37.3 ?C) (04/10 0435) ?Pulse Rate:  [88-92] 92 (04/10 0435) ?Resp:  [20-23] 20 (04/10 0435) ?BP: (119-141)/(49-60) 119/49 (04/10 0435) ?SpO2:  [99 %-100 %] 99 % (04/10 0435) ?Last BM Date : 12/10/21 ? ?Intake/Output from previous day: ?04/09 0701 - 04/10 0700 ?In: 240 [P.O.:240] ?Out: 3300 [Urine:3300] ?Intake/Output this shift: ?No intake/output data recorded. ? ?Exam: ?Awake and alert ?Abdomen soft, minimally tender, incisions clean/dry/intact  ? ?Lab Results:  ?Recent Labs  ?  12/10/21 ?0849 12/11/21 ?0738  ?WBC 18.5* 16.9*  ?HGB 10.6* 10.3*  ?HCT 35.9* 34.1*  ?PLT 358 336  ? ?BMET ?Recent Labs  ?  12/09/21 ?0434 12/10/21 ?0849  ?NA 141 135  ?K 3.9 4.0  ?CL 109 102  ?CO2 24 21*  ?GLUCOSE 108* 168*  ?BUN 14 15  ?CREATININE 0.81 0.91  ?CALCIUM 8.4* 8.3*  ? ?PT/INR ?No results for input(s): LABPROT, INR in the last 72 hours. ?ABG ?No results for input(s): PHART, HCO3 in the last 72 hours. ? ?Invalid input(s): PCO2, PO2 ? ?Studies/Results: ?No results found. ? ?Anti-infectives: ?Anti-infectives (From admission, onward)  ? ? Start     Dose/Rate Route Frequency Ordered Stop  ? 12/09/21 0942  sodium chloride 0.9 % with cefTRIAXone (ROCEPHIN) ADS Med       ?Note to Pharmacy: Nunzio Cobbs : cabinet override  ?    12/09/21 0942 12/09/21 2144  ? 12/09/21 0942  sodium chloride 0.9 % with cefTRIAXone (ROCEPHIN) ADS Med       ?Note to Pharmacy: Nunzio Cobbs : cabinet override  ?    12/09/21 0942 12/09/21 2144  ? 12/09/21 0800  ceFAZolin (ANCEF) IVPB 3g/100 mL premix       ? 3 g ?200 mL/hr over 30 Minutes Intravenous On call to O.R. 12/09/21 0750 12/09/21 1058  ? ?  ? ? ?Assessment/Plan: ?S/p laparoscopic repair of incarcerated umbilical  hernia with mesh ? ?POD#2 ?Ok from surgery to go home today. Pain controlled, tolerating PO, having bowel function. Follow up with Dr. Ninfa Linden in 4 weeks. Discharge instructions and pain Rx provided. ? ? ?Jill Alexanders MD ?12/11/2021 ? ?

## 2021-12-11 NOTE — Hospital Course (Addendum)
61 y.o. female with hyperlipidemia, diabetes, hypertension, CAD, CHF, OSA, anxiety, depression, chronic respiratory failure, endometrial cancer status post hysterectomy and salpingo-oophorectomy 12/07/21 presenting with abdominal pain on 12/08/21  to ED. ?CT abdomen pelvis showing high-grade SBO with incarcerated bowel at umbilical hernia.General surgery consulted, unable to see patient immediately d/t emergent cases as [facility], recommended NG tube. Hernia previously containing fat, thought process is during surgery at this that likely was freed and the opportunity presented for bowel to become contained within hernia ?12/09/21: surgery w/ laparoscopic repair of incarcerated umbilical hernia  ?08/21/74: pain controlled but no flatus/BM yet, tolerating some po intake. Surgery ok for discharge once pt tolerating po, passing flatus, pain controlled. ?Has had BM and tolerating diet. Seen by surgery this am and cleared for dc home. I asked her to get cbc checked in 1 wk. ?

## 2021-12-12 ENCOUNTER — Telehealth: Payer: Self-pay

## 2021-12-12 ENCOUNTER — Other Ambulatory Visit: Payer: Self-pay

## 2021-12-12 NOTE — Telephone Encounter (Signed)
Following up with Brenda [name] this morning. Patient states "I'm doing better today. Yesterday I had some nausea and vomiting. The vomiting hurt the incisions on my abdomen. I still have some pain and I'm taking the tramadol. Its about a 5/10. A prescription for Zofran was called in for me and that has helped the nausea and vomiting a lot. I did not have an appetite yesterday but I've eaten a yogurt and granola bar. I'm trying to drink plenty of fluids. I have had a BM since I've been home and have been taking the senokot." ? ?Patient denies any fever or chills. She reports incisions are dry and intact.  ? ?Instructed to call office with any fever, chills, purulent drainage, uncontrolled pain or any other questions or concerns. Patient verbalizes understanding.  ? ?Pt aware of post op appointments as well as the office number [phone] and after hours number [phone] to call if she has any questions or concerns  ?

## 2021-12-12 NOTE — Telephone Encounter (Signed)
I called the patient back and she confirmed that she has the levemir, it was picked up today and she said she got the entresto from our pharmacy today too.   ?

## 2021-12-12 NOTE — Telephone Encounter (Signed)
I can't approve the Entresto because it has her Cardiologist's name on it. Dr. Margarita Rana or the Cardiologist will have to approve that one.  ? ?For the Levemir, I checked with Claiborne Billings in our pharmacy. The Levemir is covered for $4. Claiborne Billings informed me that this was picked up from our pharmacy today (12/12/21). ?

## 2021-12-12 NOTE — Telephone Encounter (Signed)
Transition Care Management Follow-up Telephone Call ?Date of discharge and from where: 12/11/2021, [facility] ?How have you been since you were released from the hospital? She said yesterday was a rough day with nausea; but today is a lot better. She has been  able to tolerate a yogurt .  ?Any questions or concerns? Yes- She explained that her insurance will  not cover the levemir and she has been out of it for about a week. She has the novolog.  ?She was receiving the entresto through patient assistance program and it was mailed to her home. She is now insured and needs a prescription for the entresto sent to her pharmacy. She has been out of that for about 3 days.  ? ?Items Reviewed: ?Did the pt receive and understand the discharge instructions provided? Yes  ?Medications obtained and verified?  She said she has all medications except the entresto and levermir as noted above.  ?Other? No  ?Any new allergies since your discharge? No  ?Dietary orders reviewed? Yes ?Do you have support at home? Yes  ? ?Home Care and Equipment/Supplies: ?Were home health services ordered? no ?If so, what is the name of the agency? N/a  ?Has the agency set up a time to come to the patient's home? not applicable ?Were any new equipment or medical supplies ordered?  No ?What is the name of the medical supply agency? N/a ?Were you able to get the supplies/equipment? not applicable ?Do you have any questions related to the use of the equipment or supplies? No ? ?She already had a glucometer, BP monitor, CPAP machine and O2.   She uses the O2 @ 2L continuously.  ? ?Functional Questionnaire: (I = Independent and D = Dependent) ?ADLs: independent with ADLS, uses walker when she goes out of the house ? ? ?Follow up appointments reviewed: ? ?PCP Hospital f/u appt confirmed? Yes  Scheduled to see Dr Margarita Rana- 12/25/2021.  ?[facility] f/u appt confirmed? Yes  Scheduled to see oncology- 12/22/2021.  ?Are transportation arrangements  needed? No  ?If their condition worsens, is the pt aware to call PCP or go to the Emergency Dept.? Yes ?Was the patient provided with contact information for the PCP's office or ED? Yes ?Was to pt encouraged to call back with questions or concerns? Yes ? ?

## 2021-12-13 ENCOUNTER — Inpatient Hospital Stay: Payer: Medicaid Other | Attending: Gynecologic Oncology | Admitting: Gynecologic Oncology

## 2021-12-13 ENCOUNTER — Telehealth: Payer: Self-pay

## 2021-12-13 ENCOUNTER — Encounter: Payer: Self-pay | Admitting: Gynecologic Oncology

## 2021-12-13 DIAGNOSIS — Z90710 Acquired absence of both cervix and uterus: Secondary | ICD-10-CM

## 2021-12-13 DIAGNOSIS — C541 Malignant neoplasm of endometrium: Secondary | ICD-10-CM

## 2021-12-13 DIAGNOSIS — Z7189 Other specified counseling: Secondary | ICD-10-CM

## 2021-12-13 DIAGNOSIS — Z90722 Acquired absence of ovaries, bilateral: Secondary | ICD-10-CM

## 2021-12-13 NOTE — Telephone Encounter (Signed)
Following up with Brenda [name] this morning. Patient reports she has not had anymore nausea and her pain has improved since she is no longer vomiting. She endorses having a BM and an increased appetite. She was able to eat a meal last night and is feeling better. She rates her pain as 4/10 and feels like the tramadol is adequate for pain control.  ?Reminded patient of her phone appointment with Dr. Berline Lopes this afternoon and instructed to call with any questions or concerns. Patient verbalized understanding.  ?

## 2021-12-13 NOTE — Progress Notes (Signed)
Gynecologic Oncology Telehealth Consult Note: Gyn-Onc ? ?I connected with Brenda [name] on 12/13/21 at  4:00 PM EDT by telephone and verified that I am speaking with the correct person using two identifiers. ? ?I discussed the limitations, risks, security and privacy concerns of performing an evaluation and management service by telemedicine and the availability of in-person appointments. I also discussed with the patient that there may be a patient responsible charge related to this service. The patient expressed understanding and agreed to proceed. ? ?Other persons participating in the visit and their role in the encounter: none. ? ?Patient's location: home ?Provider's location: [facility] ? ?Reason for Visit: follow-up after surgery, treatment planning ? ?Treatment History: ?Oncology History  ?Endometrial adenocarcinoma ([place])  ?10/26/2021 Initial Biopsy  ? EMB: grade 1 endometrioid adenocarcinoma ?  ?11/21/2021 Imaging  ? CT A/P: ?1. No evidence for metastatic disease in the abdomen or pelvis. ?2. Endometrium appears thickened, but not well demonstrated by CT. ?Endometrial stripe appears to be 4.7 cm in thickness on sagittal CT ?imaging today. ?3. Complex paraumbilical hernia contains only fat. ?4. Skin thickening with soft tissue stranding noted anterior lower ?abdominal wall. This could be related to cellulitis and should be ?amenable to clinical inspection. ?5. Advanced coronary artery calcification in the left coronary ?distribution. ?  ?12/06/2021 Initial Diagnosis  ? Endometrial adenocarcinoma [facility]) ?  ?12/06/2021 Surgery  ? Robotic-assisted laparoscopic total hysterectomy with bilateral salpingoophorectomy, laparoscopic hernia reduction ?Findings: On EUA, 12 cm mobile uterus.  On intra-abdominal entry, normal appearing upper abdominal survey although right liver edge and diaphragm obscured secondary to adhesions from cholecystectomy.  Normal-appearing omentum with portion of the omentum within a 3 cm umbilical  hernia (reduced).  Normal-appearing small and large bowel.  Normal-appearing bilateral fallopian tubes and ovaries.  Uterus 12 cm in quite bulbous.  No obvious adenopathy.  No ascites.  No clear evidence of intra-abdominal or pelvic disease. ?  ?12/13/2021 Pathology Results  ? A. UTERUS, CERVIX, TUBES AND OVARIES:  ?- Endometrial adenocarcinoma with foci of squamous differentiation, FIGO  ?grade 1  ?- Carcinoma invades for a depth of 1.6 cm where myometrial thickness is  ?2.8 cm ( 60%)  ?- Carcinoma does not involve the cervix  ?- Benign bilateral fallopian tubes and ovaries  ?- See oncology table  ? ?ONCOLOGY TABLE:  ? ?UTERUS, CARCINOMA OR CARCINOSARCOMA: Resection  ? ?Procedure: Total hysterectomy and bilateral salpingo-oophorectomy  ?Histologic Type: Endometrioid carcinoma with foci of squamous  ?differentiation  ?Histologic Grade: FIGO grade 1  ?Myometrial Invasion:  ?     Depth of Myometrial Invasion (mm): 16 mm  ?     Myometrial Thickness (mm): 28 mm  ?     Percentage of Myometrial Invasion: 57%  ?Uterine Serosa Involvement: Not identified  ?Cervical stromal Involvement: Not identified  ?Extent of involvement of other tissue/organs: Not identified  ?Peritoneal/Ascitic Fluid: Not applicable  ?Lymphovascular Invasion: Not identified  ?Regional Lymph Nodes: Not applicable (no lymph nodes submitted or found)  ? ?     Pelvic Lymph Nodes Examined:  ?                                 0 Sentinel  ?                                 0 Non-sentinel  ?  0 Total  ?     Pelvic Lymph Nodes with Metastasis: Not applicable  ?     Para-aortic Lymph Nodes Examined:  ?                                  0 Sentinel  ?                                  0 non-sentinel  ?                                  0 total  ?     Para-aortic Lymph Nodes with Metastasis: Not applicable  ?Distant Metastasis:  ?     Distant Site(s) Involved: Not applicable  ?Pathologic Stage Classification (pTNM, AJCC 8th Edition): pT1b,  pN not  ?assigned  ?Ancillary Studies: MMR / MSI testing will be ordered  ?Representative Tumor Block: A3  ?  ? ? ?Interval History: ?She some nausea after discharge, used Zofran.  ?Appetite improving, denies emesis.  ?+ flatus and BM since discharge. ?Pain improving slowly. Increased pain with cough or bending. ?Denies vaginal discharge or bleeding.  ? ?Past Medical/Surgical History: ?Past Medical History:  ?Diagnosis Date  ? Anemia   ? Arthritis   ? "back, hands, hips" (02/22/2015)  ? CAD (coronary artery disease)   ? a. complex LAD/diagonal bifurcation PCI in 2010. b. STEMI 10/2019 s/p PTCA/DES x1 to mLAD overlapping the old stent, residual disease treated medicaly.  ? Cancer [facility])   ? uterine  ? CHF (congestive heart failure) ([place])   ? Depression   ? Diabetes mellitus ([place])   ? "started when I was pregnant; not sure if it was type 1 or type 2"   ? Hypercholesterolemia   ? Hypertension   ? Morbid obesity ([place])   ? Myocardial infarction [facility])   ? mild x 3  ? Neuromuscular disorder ([place])   ? neuropathy feet  ? Sleep apnea   ? cpap/  ? ? ?Past Surgical History:  ?Procedure Laterality Date  ? CARDIAC CATHETERIZATION  12/02/2012  ? CHOLECYSTECTOMY OPEN  09/04/1987  ? CORONARY ANGIOPLASTY WITH STENT PLACEMENT  09/03/2009  ? CORONARY/GRAFT ACUTE MI REVASCULARIZATION N/A 10/26/2019  ? Procedure: CORONARY/GRAFT ACUTE MI REVASCULARIZATION;  Surgeon: Burnell Blanks, MD;  Location: [place] CV LAB;  Service: Cardiovascular;  Laterality: N/A;  ? INCISIONAL HERNIA REPAIR N/A 12/09/2021  ? Procedure: LAPAROSCOPIC REPAIR UMBILICAL HERNIA WITH MESH;  Surgeon: Coralie Keens, MD;  Location: [facility];  Service: General;  Laterality: N/A;  ? LEFT HEART CATH AND CORONARY ANGIOGRAPHY N/A 10/26/2019  ? Procedure: LEFT HEART CATH AND CORONARY ANGIOGRAPHY;  Surgeon: Burnell Blanks, MD;  Location: [place] CV LAB;  Service: Cardiovascular;  Laterality: N/A;  ? LEFT HEART CATHETERIZATION WITH CORONARY ANGIOGRAM N/A  12/25/2012  ? Procedure: LEFT HEART CATHETERIZATION WITH CORONARY ANGIOGRAM;  Surgeon: Sherren Mocha, MD;  Location: [facility] CATH LAB;  Service: Cardiovascular;  Laterality: N/A;  ? ROBOTIC ASSISTED TOTAL HYSTERECTOMY WITH BILATERAL SALPINGO OOPHERECTOMY N/A 12/06/2021  ? Procedure: XI ROBOTIC ASSISTED TOTAL HYSTERECTOMY WITH BILATERAL SALPINGO OOPHORECTOMY;  Surgeon: Lafonda Mosses, MD;  Location: [facility];  Service: Gynecology;  Laterality: N/A;  ? UMBILICAL HERNIA REPAIR  05/04/1989  ? doesn't think they used mesh  ? ? ?  Family History  ?Problem Relation Age of Onset  ? Coronary artery disease Mother   ?     in her mid 39s  ? Hypertension Mother   ? Cancer Mother   ?     skin  ? Heart attack Father   ?     in his mid 109s  ? COPD Father   ? Stroke Sister   ? Coronary artery disease Sister   ? Diabetes Sister   ? Other Half-Sister   ? Prostate cancer Neg Hx   ? Colon cancer Neg Hx   ? Breast cancer Neg Hx   ? Endometrial cancer Neg Hx   ? Ovarian cancer Neg Hx   ? Pancreatic cancer Neg Hx   ? ? ?Social History  ? ?Socioeconomic History  ? Marital status: Married  ?  Spouse name: Not on file  ? Number of children: 2  ? Years of education: Not on file  ? Highest education level: Not on file  ?Occupational History  ? Occupation: Housewife  ?Tobacco Use  ? Smoking status: Former  ?  Packs/day: 0.50  ?  Years: 1.00  ?  Pack years: 0.50  ?  Types: Cigarettes, Cigars  ?  Quit date: 09/03/1986  ?  Years since quitting: 35.3  ? Smokeless tobacco: Never  ?Vaping Use  ? Vaping Use: Never used  ?Substance and Sexual Activity  ? Alcohol use: No  ? Drug use: No  ? Sexual activity: Not Currently  ?Other Topics Concern  ? Not on file  ?Social History Narrative  ? Not on file  ? ?Social Determinants of Health  ? ?Financial Resource Strain: High Risk  ? Difficulty of Paying Living Expenses: Hard  ?Food Insecurity: Food Insecurity Present  ? Worried About Charity fundraiser in the Last Year: Sometimes true  ? Ran Out of Food in the Last  Year: Sometimes true  ?Transportation Needs: No Transportation Needs  ? Lack of Transportation (Medical): No  ? Lack of Transportation (Non-Medical): No  ?Physical Activity: Not on file  ?Stress: Not on fi

## 2021-12-15 LAB — SURGICAL PATHOLOGY

## 2021-12-19 ENCOUNTER — Encounter: Payer: Self-pay | Admitting: Gynecologic Oncology

## 2021-12-20 ENCOUNTER — Other Ambulatory Visit: Payer: Self-pay

## 2021-12-21 ENCOUNTER — Other Ambulatory Visit: Payer: Self-pay

## 2021-12-22 ENCOUNTER — Encounter: Payer: Self-pay | Admitting: Gynecologic Oncology

## 2021-12-22 ENCOUNTER — Other Ambulatory Visit: Payer: Self-pay

## 2021-12-22 ENCOUNTER — Inpatient Hospital Stay (HOSPITAL_BASED_OUTPATIENT_CLINIC_OR_DEPARTMENT_OTHER): Payer: Medicaid Other | Admitting: Gynecologic Oncology

## 2021-12-22 ENCOUNTER — Encounter: Payer: Self-pay | Admitting: Oncology

## 2021-12-22 VITALS — BP 154/72 | HR 94 | Temp 96.8°F | Resp 20 | Wt 291.3 lb

## 2021-12-22 DIAGNOSIS — K432 Incisional hernia without obstruction or gangrene: Secondary | ICD-10-CM

## 2021-12-22 DIAGNOSIS — C541 Malignant neoplasm of endometrium: Secondary | ICD-10-CM

## 2021-12-22 DIAGNOSIS — Z90722 Acquired absence of ovaries, bilateral: Secondary | ICD-10-CM

## 2021-12-22 DIAGNOSIS — Z90710 Acquired absence of both cervix and uterus: Secondary | ICD-10-CM

## 2021-12-22 DIAGNOSIS — Z7189 Other specified counseling: Secondary | ICD-10-CM

## 2021-12-22 DIAGNOSIS — L03319 Cellulitis of trunk, unspecified: Secondary | ICD-10-CM

## 2021-12-22 MED ORDER — CEPHALEXIN 500 MG PO CAPS
500.0000 mg | ORAL_CAPSULE | Freq: Four times a day (QID) | ORAL | 0 refills | Status: AC
  Filled 2021-12-22: qty 40, 10d supply, fill #0

## 2021-12-22 NOTE — Progress Notes (Signed)
Met with Brenda [name] and advised her of appointments with Dr. Sondra Come on 01/03/2022.  Also provided her with the [place] folder and encouraged her to call with any questions. ?

## 2021-12-22 NOTE — Progress Notes (Addendum)
Gynecologic Oncology Return Clinic Visit ? ?12/22/2021 ? ?Reason for Visit: Follow-up after recent surgery, treatment planning ? ?Treatment History: ?Oncology History Overview Note  ?MMR IHC: loss of MLH1 and PMS2 ? ?Tumor size 6 cm ?  ?Endometrial adenocarcinoma ([place])  ?10/26/2021 Initial Biopsy  ? EMB: grade 1 endometrioid adenocarcinoma ?  ?11/21/2021 Imaging  ? CT A/P: ?1. No evidence for metastatic disease in the abdomen or pelvis. ?2. Endometrium appears thickened, but not well demonstrated by CT. ?Endometrial stripe appears to be 4.7 cm in thickness on sagittal CT ?imaging today. ?3. Complex paraumbilical hernia contains only fat. ?4. Skin thickening with soft tissue stranding noted anterior lower ?abdominal wall. This could be related to cellulitis and should be ?amenable to clinical inspection. ?5. Advanced coronary artery calcification in the left coronary ?distribution. ?  ?12/06/2021 Initial Diagnosis  ? Endometrial adenocarcinoma [facility]) ?  ?12/06/2021 Surgery  ? Robotic-assisted laparoscopic total hysterectomy with bilateral salpingoophorectomy, laparoscopic hernia reduction ?Findings: On EUA, 12 cm mobile uterus.  On intra-abdominal entry, normal appearing upper abdominal survey although right liver edge and diaphragm obscured secondary to adhesions from cholecystectomy.  Normal-appearing omentum with portion of the omentum within a 3 cm umbilical hernia (reduced).  Normal-appearing small and large bowel.  Normal-appearing bilateral fallopian tubes and ovaries.  Uterus 12 cm in quite bulbous.  No obvious adenopathy.  No ascites.  No clear evidence of intra-abdominal or pelvic disease. ?  ?12/13/2021 Pathology Results  ? A. UTERUS, CERVIX, TUBES AND OVARIES:  ?- Endometrial adenocarcinoma with foci of squamous differentiation, FIGO  ?grade 1  ?- Carcinoma invades for a depth of 1.6 cm where myometrial thickness is  ?2.8 cm ( 60%)  ?- Carcinoma does not involve the cervix  ?- Benign bilateral fallopian tubes and  ovaries  ?- See oncology table  ? ?ONCOLOGY TABLE:  ? ?UTERUS, CARCINOMA OR CARCINOSARCOMA: Resection  ? ?Procedure: Total hysterectomy and bilateral salpingo-oophorectomy  ?Histologic Type: Endometrioid carcinoma with foci of squamous  ?differentiation  ?Histologic Grade: FIGO grade 1  ?Myometrial Invasion:  ?     Depth of Myometrial Invasion (mm): 16 mm  ?     Myometrial Thickness (mm): 28 mm  ?     Percentage of Myometrial Invasion: 57%  ?Uterine Serosa Involvement: Not identified  ?Cervical stromal Involvement: Not identified  ?Extent of involvement of other tissue/organs: Not identified  ?Peritoneal/Ascitic Fluid: Not applicable  ?Lymphovascular Invasion: Not identified  ?Regional Lymph Nodes: Not applicable (no lymph nodes submitted or found)  ? ?     Pelvic Lymph Nodes Examined:  ?                                 0 Sentinel  ?                                 0 Non-sentinel  ?                                 0 Total  ?     Pelvic Lymph Nodes with Metastasis: Not applicable  ?     Para-aortic Lymph Nodes Examined:  ?  0 Sentinel  ?                                  0 non-sentinel  ?                                  0 total  ?     Para-aortic Lymph Nodes with Metastasis: Not applicable  ?Distant Metastasis:  ?     Distant Site(s) Involved: Not applicable  ?Pathologic Stage Classification (pTNM, AJCC 8th Edition): pT1b, pN not  ?assigned  ?Ancillary Studies: MMR / MSI testing will be ordered  ?Representative Tumor Block: A3  ?  ? ? ?Interval History: ?Patient reports overall doing well.  She has almost had complete resolution of her pain.  She denies any vaginal bleeding or discharge.  Appetite is improving and almost back to normal.  Reports regular bowel function.  Denies any bladder symptoms.  Breathing is at baseline.  Denies any nausea or emesis. ? ?Has had some mild tenderness around one of her left upper incisions.  Denies any other incisional pain. ? ?Past Medical/Surgical  History: ?Past Medical History:  ?Diagnosis Date  ? Anemia   ? Arthritis   ? "back, hands, hips" (02/22/2015)  ? CAD (coronary artery disease)   ? a. complex LAD/diagonal bifurcation PCI in 2010. b. STEMI 10/2019 s/p PTCA/DES x1 to mLAD overlapping the old stent, residual disease treated medicaly.  ? Cancer [facility])   ? uterine  ? CHF (congestive heart failure) ([place])   ? Depression   ? Diabetes mellitus ([place])   ? "started when I was pregnant; not sure if it was type 1 or type 2"   ? Hypercholesterolemia   ? Hypertension   ? Morbid obesity ([place])   ? Myocardial infarction [facility])   ? mild x 3  ? Neuromuscular disorder ([place])   ? neuropathy feet  ? Sleep apnea   ? cpap/  ? ? ?Past Surgical History:  ?Procedure Laterality Date  ? CARDIAC CATHETERIZATION  12/02/2012  ? CHOLECYSTECTOMY OPEN  09/04/1987  ? CORONARY ANGIOPLASTY WITH STENT PLACEMENT  09/03/2009  ? CORONARY/GRAFT ACUTE MI REVASCULARIZATION N/A 10/26/2019  ? Procedure: CORONARY/GRAFT ACUTE MI REVASCULARIZATION;  Surgeon: Burnell Blanks, MD;  Location: [place] CV LAB;  Service: Cardiovascular;  Laterality: N/A;  ? INCISIONAL HERNIA REPAIR N/A 12/09/2021  ? Procedure: LAPAROSCOPIC REPAIR UMBILICAL HERNIA WITH MESH;  Surgeon: Coralie Keens, MD;  [facility];  Service: General;  Laterality: N/A;  ? LEFT HEART CATH AND CORONARY ANGIOGRAPHY N/A 10/26/2019  ? Procedure: LEFT HEART CATH AND CORONARY ANGIOGRAPHY;  Surgeon: Burnell Blanks, MD;  Location: [place] CV LAB;  Service: Cardiovascular;  Laterality: N/A;  ? LEFT HEART CATHETERIZATION WITH CORONARY ANGIOGRAM N/A 12/25/2012  ? Procedure: LEFT HEART CATHETERIZATION WITH CORONARY ANGIOGRAM;  Surgeon: Sherren Mocha, MD;  Location: [facility] CATH LAB;  Service: Cardiovascular;  Laterality: N/A;  ? ROBOTIC ASSISTED TOTAL HYSTERECTOMY WITH BILATERAL SALPINGO OOPHERECTOMY N/A 12/06/2021  ? Procedure: XI ROBOTIC ASSISTED TOTAL HYSTERECTOMY WITH BILATERAL SALPINGO OOPHORECTOMY;  Surgeon: Lafonda Mosses, MD;  [facility];  Service: Gynecology;  Laterality: N/A;  ? UMBILICAL HERNIA REPAIR  05/04/1989  ? doesn't think they used mesh  ? ? ?Family History  ?Problem Relation Age of Onset  ? Coronary artery disease Mother   ?  in her mid 47s  ? Hypertension Mother   ? Cancer Mother   ?     skin  ? Heart attack Father   ?     in his mid 69s  ? COPD Father   ? Stroke Sister   ? Coronary artery disease Sister   ? Diabetes Sister   ? Other Half-Sister   ? Prostate cancer Neg Hx   ? Colon cancer Neg Hx   ? Breast cancer Neg Hx   ? Endometrial cancer Neg Hx   ? Ovarian cancer Neg Hx   ? Pancreatic cancer Neg Hx   ? ? ?Social History  ? ?Socioeconomic History  ? Marital status: Married  ?  Spouse name: Not on file  ? Number of children: 2  ? Years of education: Not on file  ? Highest education level: Not on file  ?Occupational History  ? Occupation: Housewife  ?Tobacco Use  ? Smoking status: Former  ?  Packs/day: 0.50  ?  Years: 1.00  ?  Pack years: 0.50  ?  Types: Cigarettes, Cigars  ?  Quit date: 09/03/1986  ?  Years since quitting: 35.3  ? Smokeless tobacco: Never  ?Vaping Use  ? Vaping Use: Never used  ?Substance and Sexual Activity  ? Alcohol use: No  ? Drug use: No  ? Sexual activity: Not Currently  ?Other Topics Concern  ? Not on file  ?Social History Narrative  ? Not on file  ? ?Social Determinants of Health  ? ?Financial Resource Strain: High Risk  ? Difficulty of Paying Living Expenses: Hard  ?Food Insecurity: Food Insecurity Present  ? Worried About Charity fundraiser in the Last Year: Sometimes true  ? Ran Out of Food in the Last Year: Sometimes true  ?Transportation Needs: No Transportation Needs  ? Lack of Transportation (Medical): No  ? Lack of Transportation (Non-Medical): No  ?Physical Activity: Not on file  ?Stress: Not on file  ?Social Connections: Not on file  ? ? ?Current Medications: ? ?Current Outpatient Medications:  ?  Acetaminophen-Caff-Pyrilamine (MIDOL COMPLETE) 500-60-15 MG TABS, Take 2  tablets by mouth daily as needed (pain/cramps)., Disp: , Rfl:  ?  aspirin 81 MG EC tablet, Take 1 tablet (81 mg total) by mouth daily., Disp: 30 tablet, Rfl: 11 ?  atorvastatin (LIPITOR) 80 MG tablet, Ta

## 2021-12-22 NOTE — Patient Instructions (Addendum)
It was good to see you today.  You are healing well from surgery.   ? ?I sent an antibiotic to your pharmacy.  You will take this 4 times a day for the next 10 days.  This is to treat any infection of the skin and tissue underneath the skin in your lower belly.  Please call if you have any signs or symptoms of infection, such as fevers and chills.  Otherwise I will see you back in a week and a half to make sure that things are improving. ? ?We will get you scheduled to see our radiation oncologist, Dr. Sondra Come.  We will review your case at our tumor conference on Monday to finalize the treatment plan.  I will see you back after you finish treatment. ?

## 2021-12-25 ENCOUNTER — Other Ambulatory Visit: Payer: Self-pay

## 2021-12-25 ENCOUNTER — Other Ambulatory Visit: Payer: Self-pay | Admitting: Oncology

## 2021-12-25 ENCOUNTER — Ambulatory Visit: Payer: Medicaid Other | Attending: Family Medicine | Admitting: Family Medicine

## 2021-12-25 ENCOUNTER — Inpatient Hospital Stay: Payer: Medicaid Other | Admitting: Gynecologic Oncology

## 2021-12-25 ENCOUNTER — Telehealth: Payer: Self-pay | Admitting: *Deleted

## 2021-12-25 ENCOUNTER — Encounter: Payer: Self-pay | Admitting: Family Medicine

## 2021-12-25 VITALS — BP 156/80 | HR 81 | Ht 62.0 in | Wt 275.0 lb

## 2021-12-25 DIAGNOSIS — I152 Hypertension secondary to endocrine disorders: Secondary | ICD-10-CM | POA: Diagnosis not present

## 2021-12-25 DIAGNOSIS — Z1211 Encounter for screening for malignant neoplasm of colon: Secondary | ICD-10-CM

## 2021-12-25 DIAGNOSIS — C541 Malignant neoplasm of endometrium: Secondary | ICD-10-CM | POA: Diagnosis not present

## 2021-12-25 DIAGNOSIS — E1159 Type 2 diabetes mellitus with other circulatory complications: Secondary | ICD-10-CM

## 2021-12-25 NOTE — Progress Notes (Signed)
GYN Location of Tumor / Histology: Endometrial ? ?Tobie Poet presented with symptoms of: bleeding - "I was on my period for about 3 years" ? ?Biopsies revealed:  ?10/26/2021 Initial Biopsy  ?  EMB: grade 1 endometrioid adenocarcinoma  ? ?12/13/2021 Pathology Results  ?  A. UTERUS, CERVIX, TUBES AND OVARIES:  ?- Endometrial adenocarcinoma with foci of squamous differentiation, FIGO  ?grade 1  ?- Carcinoma invades for a depth of 1.6 cm where myometrial thickness is  ?2.8 cm ( 60%)  ?- Carcinoma does not involve the cervix  ?- Benign bilateral fallopian tubes and ovaries   ? ? ?Past/Anticipated interventions by Gyn/Onc surgery, if any:  ?12/06/2021 Surgery  ?  Robotic-assisted laparoscopic total hysterectomy with bilateral salpingoophorectomy, laparoscopic hernia reduction  ? ? ?Past/Anticipated interventions by medical oncology, if any: none at this time ? ?Weight changes, if any: 10 pound weight loss since starting Ozempic ? ?Bowel/Bladder complaints, if any:   frequency, nocturia x1-2 ? ?Nausea/Vomiting, if any: no ? ?Pain issues, if any:  no ? ?SAFETY ISSUES: ?Prior radiation? no ?Pacemaker/ICD? no ?Possible current pregnancy? no, hysterectomy ?Is the patient on methotrexate? no ? ?Current Complaints / other details:  nothing of note ? ?Vitals:  ? 01/03/22 1444  ?BP: (!) 148/83  ?Pulse: 86  ?Resp: 18  ?Temp: (!) 96.8 ?F (36 ?C)  ?TempSrc: Temporal  ?SpO2: 100%  ?Height: '5\' 2"'$  (1.575 m)  ? ? ? ?

## 2021-12-25 NOTE — Progress Notes (Signed)
? ?Subjective:  ?Patient ID: Brenda [name], female    DOB: Oct 26, 1960  Age: 60 y.o. MRN: [id number] ? ?CC: Annual Exam ? ? ?HPI ?Brenda [name] is a 61 y.o. 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 ? ?Interval History: ?She reports doing well and is accompanied by her husband to today's visit.  She has had a postop follow-up visit with GYN oncology Dr. Berline Lopes.  She was prescribed Keflex for presumed cellulitis.  She denies presence of fever and her appetite is normal. ? ?She was previously scheduled for complete physical exam for today but states she is not ready to undergo additional testing like a mammogram and colonoscopy at this time. ?She has an upcoming appointment with radiation oncology. ? ?Blood pressure is elevated and she has been out of Entresto.  She states the pharmacy informed her that they needed authorization from her cardiologist. ?Past Medical History:  ?Diagnosis Date  ? Anemia   ? Arthritis   ? "back, hands, hips" (02/22/2015)  ? CAD (coronary artery disease)   ? a. complex LAD/diagonal bifurcation PCI in 2010. b. STEMI 10/2019 s/p PTCA/DES x1 to mLAD overlapping the old stent, residual disease treated medicaly.  ? Cancer [facility])   ? uterine  ? CHF (congestive heart failure) ([place])   ? Depression   ? Diabetes mellitus ([place])   ? "started when I was pregnant; not sure if it was type 1 or type 2"   ? Hypercholesterolemia   ? Hypertension   ? Morbid obesity ([place])   ? Myocardial infarction [facility])   ? mild x 3  ? Neuromuscular disorder ([place])   ? neuropathy feet  ?  Sleep apnea   ? cpap/  ? ? ?Past Surgical History:  ?Procedure Laterality Date  ? CARDIAC CATHETERIZATION  12/02/2012  ? CHOLECYSTECTOMY OPEN  09/04/1987  ? CORONARY ANGIOPLASTY WITH STENT PLACEMENT  09/03/2009  ? CORONARY/GRAFT ACUTE MI REVASCULARIZATION N/A 10/26/2019  ? Procedure: CORONARY/GRAFT ACUTE MI REVASCULARIZATION;  Surgeon: Burnell Blanks, MD;  Location: [place] [facility];  Service: Cardiovascular;  Laterality: N/A;  ? INCISIONAL HERNIA REPAIR N/A 12/09/2021  ? Procedure: LAPAROSCOPIC REPAIR UMBILICAL HERNIA WITH MESH;  Surgeon: Coralie Keens, MD;  Location: [facility];  Service: General;  Laterality: N/A;  ? LEFT HEART CATH AND CORONARY ANGIOGRAPHY N/A 10/26/2019  ? Procedure: LEFT HEART CATH AND CORONARY ANGIOGRAPHY;  Surgeon: Burnell Blanks, MD;  Location: [place] [facility];  Service: Cardiovascular;  Laterality: N/A;  ? LEFT HEART CATHETERIZATION WITH CORONARY ANGIOGRAM N/A 12/25/2012  ? Procedure: LEFT HEART CATHETERIZATION WITH CORONARY ANGIOGRAM;  Surgeon: Sherren Mocha, MD;  Location: [facility] CATH LAB;  Service: Cardiovascular;  Laterality: N/A;  ? ROBOTIC ASSISTED TOTAL HYSTERECTOMY WITH BILATERAL SALPINGO OOPHERECTOMY N/A 12/06/2021  ? Procedure: XI ROBOTIC ASSISTED TOTAL HYSTERECTOMY WITH BILATERAL SALPINGO OOPHORECTOMY;  Surgeon: Lafonda Mosses, MD;  Location: [facility];  Service: Gynecology;  Laterality: N/A;  ? UMBILICAL HERNIA REPAIR  05/04/1989  ? doesn't think they used mesh  ? ? ?Family History  ?Problem Relation Age of Onset  ? Coronary artery disease Mother   ?  in her mid 24s  ? Hypertension Mother   ? Cancer Mother   ?     skin  ? Heart attack Father   ?     in his mid 19s  ? COPD Father   ? Stroke Sister   ? Coronary artery disease Sister   ? Diabetes Sister   ? Other Half-Sister   ? Prostate cancer Neg Hx   ? Colon cancer Neg Hx   ? Breast cancer Neg Hx   ? Endometrial cancer Neg Hx   ? Ovarian cancer Neg Hx   ? Pancreatic cancer Neg Hx   ? ? ?Social History   ? ?Socioeconomic History  ? Marital status: Married  ?  Spouse name: Not on file  ? Number of children: 2  ? Years of education: Not on file  ? Highest education level: Not on file  ?Occupational History  ? Occupation: Housewife  ?Tobacco Use  ? Smoking status: Former  ?  Packs/day: 0.50  ?  Years: 1.00  ?  Pack years: 0.50  ?  Types: Cigarettes, Cigars  ?  Quit date: 09/03/1986  ?  Years since quitting: 35.3  ? Smokeless tobacco: Never  ?Vaping Use  ? Vaping Use: Never used  ?Substance and Sexual Activity  ? Alcohol use: No  ? Drug use: No  ? Sexual activity: Not Currently  ?Other Topics Concern  ? Not on file  ?Social History Narrative  ? Not on file  ? ?Social Determinants of Health  ? ?Financial Resource Strain: High Risk  ? Difficulty of Paying Living Expenses: Hard  ?Food Insecurity: Food Insecurity Present  ? Worried About Charity fundraiser in the Last Year: Sometimes true  ? Ran Out of Food in the Last Year: Sometimes true  ?Transportation Needs: No Transportation Needs  ? Lack of Transportation (Medical): No  ? Lack of Transportation (Non-Medical): No  ?Physical Activity: Not on file  ?Stress: Not on file  ?Social Connections: Not on file  ? ? ?Allergies  ?Allergen Reactions  ? Hydrocodone Shortness Of Breath  ?  "I forget to breathe."   ? Clopidogrel Bisulfate Rash  ? ? ?Outpatient Medications Prior to Visit  ?Medication Sig Dispense Refill  ? Acetaminophen-Caff-Pyrilamine (MIDOL COMPLETE) 500-60-15 MG TABS Take 2 tablets by mouth daily as needed (pain/cramps).    ? aspirin 81 MG EC tablet Take 1 tablet (81 mg total) by mouth daily. 30 tablet 11  ? atorvastatin (LIPITOR) 80 MG tablet Take 1 tablet (80 mg total) by mouth every evening. 30 tablet 6  ? Blood Glucose Monitoring Suppl (TRUE METRIX METER) DEVI 1 each by Does not apply route 3 (three) times daily. 1 Device 0  ? cephALEXin (KEFLEX) 500 MG capsule Take 1 capsule (500 mg total) by mouth 4 (four) times daily for 10 days. 40 capsule 0  ?  empagliflozin (JARDIANCE) 10 MG TABS tablet Take 1 tablet (10 mg total) by mouth daily. 30 tablet 6  ? FLUoxetine (PROZAC) 20 MG capsule Take 1 capsule (20 mg total) by mouth daily. 30 capsule 6  ? furosemide (LASIX) 40 MG tablet Take 1 tablet (40 mg total) by mouth daily. 30 tablet 6  ? gabapentin (NEURONTIN) 300 MG capsule Take 2 capsules (600 mg total) by mouth at bedtime. 60 capsule 6  ? glucose blood (TRUE METRIX BLOOD GLUCOSE TEST) test strip Use as instructed (Patient taking differently: 3 (three) times daily. Use as instructed) 100 each 5  ? insulin aspart (  NOVOLOG) 100 UNIT/ML injection Inject 10-20 Units into the skin in the morning, at noon, and at bedtime. If BS<150=Take 10 units or If BS>150=Take 20 units    ? insulin detemir (LEVEMIR FLEXPEN) 100 UNIT/ML FlexPen Inject 22 Units into the skin 2 (two) times daily. (Patient taking differently: Inject 25 Units into the skin 2 (two) times daily.) 15 mL 2  ? Insulin Pen Needle (NOVOFINE) 30G X 8 MM MISC Use as directed with Novolin 70/30 insulin (Patient taking differently: Use as directed with Novolin 70/30 insulin) 100 each 1  ? Misc. Devices MISC Portable oxygen concentrator, 2L oxygen.  Diagnoses-chronic respiratory failure with hypoxia 1 each 0  ? Multiple Minerals (CALCIUM-MAGNESIUM-ZINC) TABS Take 1 tablet by mouth daily.    ? nitroGLYCERIN (NITROSTAT) 0.4 MG SL tablet Place 1 tablet (0.4 mg total) under the tongue every 5 (five) minutes as needed for chest pain (up to 3 doses. If taking 3rd dose call 911). 25 tablet 3  ? prasugrel (EFFIENT) 5 MG TABS tablet Take 1 tablet (5 mg total) by mouth daily. 30 tablet 6  ? Semaglutide,0.25 or 0.'5MG'$ /DOS, (OZEMPIC, 0.25 OR 0.5 MG/DOSE,) 2 MG/1.5ML SOPN Inject 0.5 mg into the skin once a week. 1.5 mL 6  ? TRUEPLUS LANCETS 30G MISC 1 each by Does not apply route 3 (three) times daily. 100 each 5  ? sacubitril-valsartan (ENTRESTO) 24-26 MG Take 1 tablet by mouth 2 (two) times daily. (Patient not taking: Reported  on 12/25/2021) 180 tablet 3  ? ?No facility-administered medications prior to visit.  ? ? ? ?ROS ?Review of Systems  ?Constitutional:  Negative for activity change, appetite change and fatigue.  ?HENT:  Negative for con

## 2021-12-25 NOTE — Telephone Encounter (Signed)
Spoke with pt this morning who stated that the redness in her abdomen area looks better. She denies any discomfort or pain, fevers or chills, or changes in bowel or bladder. Encourage hydration and continue taking the antibiotics prescribed by Dr.Tucker. Call us if there's any changes in condition. Pt verbalized understanding and did not voice any concerns at the moment.  ?

## 2021-12-25 NOTE — Progress Notes (Signed)
Gynecologic Oncology Multi-Disciplinary Disposition Conference Note ? ?Date of the Conference: 12/25/2021 ? ?Patient Name: Brenda [name]  ?Referring Provider: Dr. Kennon Rounds ?Primary GYN Oncologist: Dr. Berline Lopes ? ?Stage/Disposition:  Stage IB, grade 1 endometrioid endometrial adenocarcinoma. Disposition is to vaginal brachytherapy with consideration for pelvic radiation.  ? ?This Multidisciplinary conference took place involving physicians from [place], Medical Oncology, Radiation Oncology, Pathology, Radiology along with the Gynecologic Oncology Nurse Practitioner and Gynecologic Oncology Nurse Navigator.  Comprehensive assessment of the patient's malignancy, staging, need for surgery, chemotherapy, radiation therapy, and need for further testing were reviewed. Supportive measures, both inpatient and following discharge were also discussed. The recommended plan of care is documented. Greater than 35 minutes were spent correlating and coordinating this patient's care.  ?

## 2021-12-28 ENCOUNTER — Encounter: Payer: Self-pay | Admitting: Gynecologic Oncology

## 2022-01-01 ENCOUNTER — Other Ambulatory Visit: Payer: Self-pay

## 2022-01-01 ENCOUNTER — Inpatient Hospital Stay: Payer: Medicaid Other | Attending: Gynecologic Oncology | Admitting: Gynecologic Oncology

## 2022-01-01 ENCOUNTER — Encounter: Payer: Self-pay | Admitting: Gynecologic Oncology

## 2022-01-01 ENCOUNTER — Telehealth: Payer: Self-pay

## 2022-01-01 VITALS — BP 164/68 | HR 100 | Temp 98.5°F | Resp 18 | Ht 62.0 in | Wt 273.0 lb

## 2022-01-01 DIAGNOSIS — Z7189 Other specified counseling: Secondary | ICD-10-CM

## 2022-01-01 DIAGNOSIS — Z90722 Acquired absence of ovaries, bilateral: Secondary | ICD-10-CM

## 2022-01-01 DIAGNOSIS — L03319 Cellulitis of trunk, unspecified: Secondary | ICD-10-CM

## 2022-01-01 DIAGNOSIS — Z90710 Acquired absence of both cervix and uterus: Secondary | ICD-10-CM

## 2022-01-01 DIAGNOSIS — C541 Malignant neoplasm of endometrium: Secondary | ICD-10-CM

## 2022-01-01 NOTE — Patient Instructions (Signed)
It was good to see you today.  You are continuing to heal well from surgery.  I will see you back for follow-up after you have finished radiation. ?

## 2022-01-01 NOTE — Progress Notes (Signed)
Gynecologic Oncology Return Clinic Visit  01/01/2022  Reason for Visit: Postoperative follow-up  Treatment History: Oncology History Overview Note  MMR IHC: loss of MLH1 and PMS2 MSI - high MLH1 hyper methylation present  Tumor size 6 cm   Endometrial adenocarcinoma (HCC)  10/26/2021 Initial Biopsy   EMB: grade 1 endometrioid adenocarcinoma   11/21/2021 Imaging   CT A/P: 1. No evidence for metastatic disease in the abdomen or pelvis. 2. Endometrium appears thickened, but not well demonstrated by CT. Endometrial stripe appears to be 4.7 cm in thickness on sagittal CT imaging today. 3. Complex paraumbilical hernia contains only fat. 4. Skin thickening with soft tissue stranding noted anterior lower abdominal wall. This could be related to cellulitis and should be amenable to clinical inspection. 5. Advanced coronary artery calcification in the left coronary distribution.   12/06/2021 Initial Diagnosis   Endometrial adenocarcinoma (HCC)   12/06/2021 Surgery   Robotic-assisted laparoscopic total hysterectomy with bilateral salpingoophorectomy, laparoscopic hernia reduction Findings: On EUA, 12 cm mobile uterus.  On intra-abdominal entry, normal appearing upper abdominal survey although right liver edge and diaphragm obscured secondary to adhesions from cholecystectomy.  Normal-appearing omentum with portion of the omentum within a 3 cm umbilical hernia (reduced).  Normal-appearing small and large bowel.  Normal-appearing bilateral fallopian tubes and ovaries.  Uterus 12 cm in quite bulbous.  No obvious adenopathy.  No ascites.  No clear evidence of intra-abdominal or pelvic disease.   12/13/2021 Pathology Results   A. UTERUS, CERVIX, TUBES AND OVARIES:  - Endometrial adenocarcinoma with foci of squamous differentiation, FIGO  grade 1  - Carcinoma invades for a depth of 1.6 cm where myometrial thickness is  2.8 cm ( 60%)  - Carcinoma does not involve the cervix  - Benign bilateral  fallopian tubes and ovaries  - See oncology table   ONCOLOGY TABLE:   UTERUS, CARCINOMA OR CARCINOSARCOMA: Resection   Procedure: Total hysterectomy and bilateral salpingo-oophorectomy  Histologic Type: Endometrioid carcinoma with foci of squamous  differentiation  Histologic Grade: FIGO grade 1  Myometrial Invasion:       Depth of Myometrial Invasion (mm): 16 mm       Myometrial Thickness (mm): 28 mm       Percentage of Myometrial Invasion: 57%  Uterine Serosa Involvement: Not identified  Cervical stromal Involvement: Not identified  Extent of involvement of other tissue/organs: Not identified  Peritoneal/Ascitic Fluid: Not applicable  Lymphovascular Invasion: Not identified  Regional Lymph Nodes: Not applicable (no lymph nodes submitted or found)        Pelvic Lymph Nodes Examined:                                   0 Sentinel                                   0 Non-sentinel                                   0 Total       Pelvic Lymph Nodes with Metastasis: Not applicable       Para-aortic Lymph Nodes Examined:  0 Sentinel                                    0 non-sentinel                                    0 total       Para-aortic Lymph Nodes with Metastasis: Not applicable  Distant Metastasis:       Distant Site(s) Involved: Not applicable  Pathologic Stage Classification (pTNM, AJCC 8th Edition): pT1b, pN not  assigned  Ancillary Studies: MMR / MSI testing will be ordered  Representative Tumor Block: A3      Interval History: Patient reports doing well.  Normal bowel and bladder symptoms.  Denies any fevers or chills.  Husband thinks that her erythema has almost completely or completely resolved.  She has 1 more day of antibiotics left.  Past Medical/Surgical History: Past Medical History:  Diagnosis Date   Anemia    Arthritis    "back, hands, hips" (02/22/2015)   CAD (coronary artery disease)    a. complex LAD/diagonal  bifurcation PCI in 2010. b. STEMI 10/2019 s/p PTCA/DES x1 to mLAD overlapping the old stent, residual disease treated medicaly.   Cancer (HCC)    uterine   CHF (congestive heart failure) (HCC)    Depression    Diabetes mellitus (HCC)    "started when I was pregnant; not sure if it was type 1 or type 2"    Hypercholesterolemia    Hypertension    Morbid obesity (HCC)    Myocardial infarction (HCC)    mild x 3   Neuromuscular disorder (HCC)    neuropathy feet   Sleep apnea    cpap/    Past Surgical History:  Procedure Laterality Date   CARDIAC CATHETERIZATION  12/02/2012   CHOLECYSTECTOMY OPEN  09/04/1987   CORONARY ANGIOPLASTY WITH STENT PLACEMENT  09/03/2009   CORONARY/GRAFT ACUTE MI REVASCULARIZATION N/A 10/26/2019   Procedure: CORONARY/GRAFT ACUTE MI REVASCULARIZATION;  Surgeon: Kathleene Hazel, MD;  Location: [facility];  Service: Cardiovascular;  Laterality: N/A;   INCISIONAL HERNIA REPAIR N/A 12/09/2021   Procedure: LAPAROSCOPIC REPAIR UMBILICAL HERNIA WITH MESH;  Surgeon: Abigail Miyamoto, MD;  Location: [facility];  Service: General;  Laterality: N/A;   LEFT HEART CATH AND CORONARY ANGIOGRAPHY N/A 10/26/2019   Procedure: LEFT HEART CATH AND CORONARY ANGIOGRAPHY;  Surgeon: Kathleene Hazel, MD;  Location: [facility];  Service: Cardiovascular;  Laterality: N/A;   LEFT HEART CATHETERIZATION WITH CORONARY ANGIOGRAM N/A 12/25/2012   Procedure: LEFT HEART CATHETERIZATION WITH CORONARY ANGIOGRAM;  Surgeon: Tonny Bollman, MD;  Location: [facility] CATH LAB;  Service: Cardiovascular;  Laterality: N/A;   ROBOTIC ASSISTED TOTAL HYSTERECTOMY WITH BILATERAL SALPINGO OOPHERECTOMY N/A 12/06/2021   Procedure: XI ROBOTIC ASSISTED TOTAL HYSTERECTOMY WITH BILATERAL SALPINGO OOPHORECTOMY;  Surgeon: Carver Fila, MD;  Location: [facility];  Service: Gynecology;  Laterality: N/A;   UMBILICAL HERNIA REPAIR  05/04/1989   doesn't think they used mesh    Family History  Problem  Relation Age of Onset   Coronary artery disease Mother        in her mid 26s   Hypertension Mother    Cancer Mother        skin   Heart attack Father        in his  mid 32s   COPD Father    Stroke Sister    Coronary artery disease Sister    Diabetes Sister    Other Half-Sister    Prostate cancer Neg Hx    Colon cancer Neg Hx    Breast cancer Neg Hx    Endometrial cancer Neg Hx    Ovarian cancer Neg Hx    Pancreatic cancer Neg Hx     Social History   Socioeconomic History   Marital status: Married    Spouse name: Not on file   Number of children: 2   Years of education: Not on file   Highest education level: Not on file  Occupational History   Occupation: Housewife  Tobacco Use   Smoking status: Former    Packs/day: 0.50    Years: 1.00    Pack years: 0.50    Types: Cigarettes, Cigars    Quit date: 09/03/1986    Years since quitting: 35.3   Smokeless tobacco: Never  Vaping Use   Vaping Use: Never used  Substance and Sexual Activity   Alcohol use: No   Drug use: No   Sexual activity: Not Currently  Other Topics Concern   Not on file  Social History Narrative   Not on file   Social Determinants of Health   Financial Resource Strain: High Risk   Difficulty of Paying Living Expenses: Hard  Food Insecurity: Food Insecurity Present   Worried About Running Out of Food in the Last Year: Sometimes true   Ran Out of Food in the Last Year: Sometimes true  Transportation Needs: No Transportation Needs   Lack of Transportation (Medical): No   Lack of Transportation (Non-Medical): No  Physical Activity: Not on file  Stress: Not on file  Social Connections: Not on file    Current Medications:  Current Outpatient Medications:    Acetaminophen-Caff-Pyrilamine (MIDOL COMPLETE) 500-60-15 MG TABS, Take 2 tablets by mouth daily as needed (pain/cramps)., Disp: , Rfl:    aspirin 81 MG EC tablet, Take 1 tablet (81 mg total) by mouth daily., Disp: 30 tablet, Rfl: 11    atorvastatin (LIPITOR) 80 MG tablet, Take 1 tablet (80 mg total) by mouth every evening., Disp: 30 tablet, Rfl: 6   Blood Glucose Monitoring Suppl (TRUE METRIX METER) DEVI, 1 each by Does not apply route 3 (three) times daily., Disp: 1 Device, Rfl: 0   cephALEXin (KEFLEX) 500 MG capsule, Take 1 capsule (500 mg total) by mouth 4 (four) times daily for 10 days., Disp: 40 capsule, Rfl: 0   empagliflozin (JARDIANCE) 10 MG TABS tablet, Take 1 tablet (10 mg total) by mouth daily., Disp: 30 tablet, Rfl: 6   FLUoxetine (PROZAC) 20 MG capsule, Take 1 capsule (20 mg total) by mouth daily., Disp: 30 capsule, Rfl: 6   furosemide (LASIX) 40 MG tablet, Take 1 tablet (40 mg total) by mouth daily., Disp: 30 tablet, Rfl: 6   gabapentin (NEURONTIN) 300 MG capsule, Take 2 capsules (600 mg total) by mouth at bedtime., Disp: 60 capsule, Rfl: 6   glucose blood (TRUE METRIX BLOOD GLUCOSE TEST) test strip, Use as instructed (Patient taking differently: 3 (three) times daily. Use as instructed), Disp: 100 each, Rfl: 5   insulin aspart (NOVOLOG) 100 UNIT/ML injection, Inject 10-20 Units into the skin in the morning, at noon, and at bedtime. If BS<150=Take 10 units or If BS>150=Take 20 units, Disp: , Rfl:    insulin detemir (LEVEMIR FLEXPEN) 100 UNIT/ML FlexPen, Inject 22 Units into the  skin 2 (two) times daily. (Patient taking differently: Inject 25 Units into the skin 2 (two) times daily.), Disp: 15 mL, Rfl: 2   Insulin Pen Needle (NOVOFINE) 30G X 8 MM MISC, Use as directed with Novolin 70/30 insulin (Patient taking differently: Use as directed with Novolin 70/30 insulin), Disp: 100 each, Rfl: 1   Misc. Devices MISC, Portable oxygen concentrator, 2L oxygen.  Diagnoses-chronic respiratory failure with hypoxia, Disp: 1 each, Rfl: 0   Multiple Minerals (CALCIUM-MAGNESIUM-ZINC) TABS, Take 1 tablet by mouth daily., Disp: , Rfl:    nitroGLYCERIN (NITROSTAT) 0.4 MG SL tablet, Place 1 tablet (0.4 mg total) under the tongue every 5  (five) minutes as needed for chest pain (up to 3 doses. If taking 3rd dose call 911)., Disp: 25 tablet, Rfl: 3   prasugrel (EFFIENT) 5 MG TABS tablet, Take 1 tablet (5 mg total) by mouth daily., Disp: 30 tablet, Rfl: 6   Semaglutide,0.25 or 0.5MG /DOS, (OZEMPIC, 0.25 OR 0.5 MG/DOSE,) 2 MG/1.5ML SOPN, Inject 0.5 mg into the skin once a week., Disp: 1.5 mL, Rfl: 6   TRUEPLUS LANCETS 30G MISC, 1 each by Does not apply route 3 (three) times daily., Disp: 100 each, Rfl: 5   sacubitril-valsartan (ENTRESTO) 24-26 MG, Take 1 tablet by mouth 2 (two) times daily. (Patient not taking: Reported on 12/25/2021), Disp: 180 tablet, Rfl: 3  Review of Systems: Denies appetite changes, fevers, chills, fatigue, unexplained weight changes. Denies hearing loss, neck lumps or masses, mouth sores, ringing in ears or voice changes. Denies cough or wheezing.  Denies shortness of breath. Denies chest pain or palpitations. Denies leg swelling. Denies abdominal distention, pain, blood in stools, constipation, diarrhea, nausea, vomiting, or early satiety. Denies pain with intercourse, dysuria, frequency, hematuria or incontinence. Denies hot flashes, pelvic pain, vaginal bleeding or vaginal discharge.   Denies joint pain, back pain or muscle pain/cramps. Denies itching, rash, or wounds. Denies dizziness, headaches, numbness or seizures. Denies swollen lymph nodes or glands, denies easy bruising or bleeding. Denies anxiety, depression, confusion, or decreased concentration.  Physical Exam: BP (!) 164/68 (BP Location: Right Arm, Patient Position: Sitting)   Pulse 100   Temp 98.5 F (36.9 C) (Tympanic)   Resp 18   Ht 5\' 2"  (1.575 m)   Wt 273 lb (123.8 kg)   LMP 10/30/2014 (Approximate)   SpO2 100%   BMI 49.93 kg/m  General: Alert, oriented, no acute distress. HEENT: Normocephalic, atraumatic, sclera anicteric. Chest: Unlabored breathing on 2 L of oxygen by nasal cannula. Abdomen: Obese, soft, nontender.  Normoactive  bowel sounds.  No masses or hepatosplenomegaly appreciated.  Well-healed incisions.  No erythema or induration around her incisions.  Along her dependent pannus, there is persistent but significantly decreased edema. No erythema initially visible although very slight blanching erythema noted along most dependent portion of the pannus.  No tenderness to palpation. Extremities: Grossly normal range of motion.  Warm, well perfused.  Trace edema bilaterally.  Laboratory & Radiologic Studies: None new  Assessment & Plan: Brenda [name] is a 61 y.o. 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 MSI-high. MMR IHC with loss of MLH1 and PMS2.  MLH1 promoter hyper methylation present.  Patient is overall doing well and continues to improve from a postoperative standpoint.  Is finishing course of antibiotics for superficial cellulitis of her pannus, significantly improved on exam today.   We will have my clinic reach out to [facility] surgery regarding postoperative follow-up  after her hernia repair.  We reviewed pathology again.  Discussed recent tumor board conference.  Given risk factors, the patient will benefit from adjuvant radiation.  I reviewed benefits and risks of vaginal brachytherapy versus pelvic radiation.  Final pathology reveals a grade 1 endometrioid endometrial adenocarcinoma with just over 50% myometrial invasion.  Cervix and bilateral adnexa uninvolved.  Given her adiposity as well as difficulty with ventilation, lymph node assessment was deferred.  Tumor size is 6 cm, no lymphovascular invasion noted. By ESMO-modified criteria, the patient would be low risk of nodal involvement.  By Mayo criteria, the patient would be high risk.  She sees Dr. Roselind Messier later this week.  I will see her back for follow-up after she finishes radiation.  24 minutes of total time was spent for this patient encounter, including  preparation, face-to-face counseling with the patient and coordination of care, and documentation of the encounter.  Eugene Garnet, MD  Division of Gynecologic Oncology  Department of Obstetrics and Gynecology  [facility]

## 2022-01-01 NOTE — Telephone Encounter (Signed)
**Note De-Identified Theia Dezeeuw Obfuscation** Brenda [name] (Key: [id number]) ?Entresto 24-'26MG'$  tablets ?Form: [employer] Electronic [place] Form ([phone] NCPDP) ?Determination: Favorable ? ?Message from Plan ?PA Case: [id number], Status: Approved, Coverage Starts on: 01/01/2022 12:00:00 AM, Coverage Ends on: 01/01/2023 12:00:00 AM. ? ?I have notified [name] at [facility] (Ph: [phone]) of this approval. ?

## 2022-01-03 ENCOUNTER — Ambulatory Visit
Admission: RE | Admit: 2022-01-03 | Discharge: 2022-01-03 | Disposition: A | Discharge status: Home / Self Care | Payer: Medicaid Other | Source: Ambulatory Visit | Attending: Radiation Oncology | Admitting: Radiation Oncology

## 2022-01-03 ENCOUNTER — Encounter: Payer: Self-pay | Admitting: Radiation Oncology

## 2022-01-03 ENCOUNTER — Other Ambulatory Visit: Payer: Self-pay

## 2022-01-03 VITALS — BP 148/83 | HR 86 | Temp 96.8°F | Resp 18 | Ht 62.0 in

## 2022-01-03 DIAGNOSIS — Z7984 Long term (current) use of oral hypoglycemic drugs: Secondary | ICD-10-CM | POA: Diagnosis not present

## 2022-01-03 DIAGNOSIS — Z79899 Other long term (current) drug therapy: Secondary | ICD-10-CM | POA: Diagnosis not present

## 2022-01-03 DIAGNOSIS — E7800 Pure hypercholesterolemia, unspecified: Secondary | ICD-10-CM | POA: Diagnosis not present

## 2022-01-03 DIAGNOSIS — Z90710 Acquired absence of both cervix and uterus: Secondary | ICD-10-CM | POA: Diagnosis not present

## 2022-01-03 DIAGNOSIS — D649 Anemia, unspecified: Secondary | ICD-10-CM | POA: Insufficient documentation

## 2022-01-03 DIAGNOSIS — Z800 Family history of malignant neoplasm of digestive organs: Secondary | ICD-10-CM | POA: Insufficient documentation

## 2022-01-03 DIAGNOSIS — I509 Heart failure, unspecified: Secondary | ICD-10-CM | POA: Diagnosis not present

## 2022-01-03 DIAGNOSIS — Z7982 Long term (current) use of aspirin: Secondary | ICD-10-CM | POA: Diagnosis not present

## 2022-01-03 DIAGNOSIS — Z87891 Personal history of nicotine dependence: Secondary | ICD-10-CM | POA: Diagnosis not present

## 2022-01-03 DIAGNOSIS — G4730 Sleep apnea, unspecified: Secondary | ICD-10-CM | POA: Insufficient documentation

## 2022-01-03 DIAGNOSIS — R109 Unspecified abdominal pain: Secondary | ICD-10-CM | POA: Diagnosis not present

## 2022-01-03 DIAGNOSIS — I2510 Atherosclerotic heart disease of native coronary artery without angina pectoris: Secondary | ICD-10-CM | POA: Insufficient documentation

## 2022-01-03 DIAGNOSIS — I110 Hypertensive heart disease with heart failure: Secondary | ICD-10-CM | POA: Insufficient documentation

## 2022-01-03 DIAGNOSIS — E119 Type 2 diabetes mellitus without complications: Secondary | ICD-10-CM | POA: Insufficient documentation

## 2022-01-03 DIAGNOSIS — I252 Old myocardial infarction: Secondary | ICD-10-CM | POA: Diagnosis not present

## 2022-01-03 DIAGNOSIS — Z794 Long term (current) use of insulin: Secondary | ICD-10-CM | POA: Diagnosis not present

## 2022-01-03 DIAGNOSIS — C541 Malignant neoplasm of endometrium: Secondary | ICD-10-CM | POA: Diagnosis present

## 2022-01-03 DIAGNOSIS — R079 Chest pain, unspecified: Secondary | ICD-10-CM | POA: Diagnosis not present

## 2022-01-03 NOTE — Progress Notes (Signed)
?Radiation Oncology         ([phone]) [phone] ?________________________________ ? ?Initial Outpatient Consultation ? ?Name: Brenda [name] MRN: [id number]  ?Date: 01/03/2022  DOB: April 10, 1961 ? ?ES:PQZRAQ, Charlane Ferretti, MD  Lafonda Mosses, MD  ? ?REFERRING PHYSICIAN: Lafonda Mosses, MD ? ?DIAGNOSIS: The encounter diagnosis was Endometrial adenocarcinoma ([place]). ? 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 ?HISTORY OF PRESENT ILLNESS::Brenda [name] is a 61 y.o. female who is accompanied by her husband. she is seen as a courtesy of Dr. Berline Lopes for an opinion concerning radiation therapy as part of management for her recently diagnosed endometrial cancer.  ? ?The patient was hospitalized from 10/18/21 through 10/21/21 with acute on chronic anemia due to vaginal bleeding. Prior to admission, the patient had a 2 year history of vaginal bleeding which worsened 4 days before coming to the hospital.  She reported blood clots which ranged from golf ball size to baseball size for several days and she had been soaking 2 pads with in 30 to 45 minutes.  GYN was consulted and the patient was started on Megace 40 mg twice daily, along with PRBC transfusions. At discharge, her hemoglobin was stable at 8.9 and patient's vaginal bleeding had significantly slowed, and her blood clots were smaller than before. (Of note: the patient initially presented to the ED for evaluation of chest pain and was found to be anemic).  ? ?Of note: Pelvic ultrasound on 07/13/21 showed an endometrial thickness of 27.7 mm. ? ?Following discharge, the patient met with her OBGYN, Dr. Kennon Rounds, on 11/09/21 for further evaluation and management. During this viist, the patient endorsed an overall irregular period history prior to menopause. Since discharge, the patient reported no bleeding until 11/05/21 when her bleeding started again, (light spotting).  Pap smear performed during this visit revealed adenocarcinoma, and endometrial biopsies collected revealed FIGO grade 1 endometrioid adenocarcinoma, with squamous differentiation.  ? ?Accordingly, the patient was referred to Dr. Berline Lopes on 11/17/21 to discuss treatment options. Physical exam performed during this visit revealed a narrow vaginal introitus with some blood noted within the vault. The Uterus was difficult to assess, especially pertaining to mobility and uterine size given body habitus but significant descensus was not appreciated. Given the patient's multiple comorbidities and chronic obesity, Dr. Berline Lopes noted difficulty in proceeding with a vaginal hysterectomy, and recommended for further work-up to rule out metastatic disease.  ? ?CT of the abdomen and pelvis on 11/21/21 showed no evidence for metastatic disease in the abdomen or pelvis. CT also showed a thickened endometrium (not well evaluated), and skin thickening with soft tissue stranding noted about the anterior lower abdominal wall. ? ?Ultimately, the patient opted to proceed with total hysterectomy and BSO on 12/06/21 under the care of Dr. Berline Lopes. 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 Cervix, and bilateral fallopian tubes and ovaries were otherwise benign. (Given her adiposity as well as difficulty with ventilation, lymph node assessment was deferred).  ? ?On 12/08/21, the patient presented to the ED with abdominal pain. CT of the abdomen pelvis showed high-grade SBO with incarcerated bowel within the previously noted umbilical hernia. General surgery was consulted who recommended NG tube placement. Following review, it was likely that the fat containing hernia was freed during surgery which caused the bowel to become  contained within hernia. Subsequently, the patient  underwent surgery w/ laparoscopic repair of  umbilical hernia on 74/12/87. Following return to PO diet and pain management, the patient was discharged home on 12/11/21 in stable condition.  ? ? ?PREVIOUS RADIATION THERAPY: No ? ?PAST MEDICAL HISTORY:  ?Past Medical History:  ?Diagnosis Date  ? Anemia   ? Arthritis   ? "back, hands, hips" (02/22/2015)  ? CAD (coronary artery disease)   ? a. complex LAD/diagonal bifurcation PCI in 2010. b. STEMI 10/2019 s/p PTCA/DES x1 to mLAD overlapping the old stent, residual disease treated medicaly.  ? [facility])   ? uterine  ? CHF (congestive heart failure) ([place])   ? Depression   ? Diabetes mellitus ([place])   ? "started when I was pregnant; not sure if it was type 1 or type 2"   ? Hypercholesterolemia   ? Hypertension   ? Morbid obesity ([place])   ? Myocardial infarction [facility])   ? mild x 3  ? Neuromuscular disorder ([place])   ? neuropathy feet  ? Sleep apnea   ? cpap/  ? ? ?PAST SURGICAL HISTORY: ?Past Surgical History:  ?Procedure Laterality Date  ? CARDIAC CATHETERIZATION  12/02/2012  ? CHOLECYSTECTOMY OPEN  09/04/1987  ? CORONARY ANGIOPLASTY WITH STENT PLACEMENT  09/03/2009  ? CORONARY/GRAFT ACUTE MI REVASCULARIZATION N/A 10/26/2019  ? Procedure: CORONARY/GRAFT ACUTE MI REVASCULARIZATION;  Surgeon: Burnell Blanks, MD;  Location: [place] CV LAB;  Service: Cardiovascular;  Laterality: N/A;  ? INCISIONAL HERNIA REPAIR N/A 12/09/2021  ? Procedure: LAPAROSCOPIC REPAIR UMBILICAL HERNIA WITH MESH;  Surgeon: Coralie Keens, MD;  Location: [facility];  Service: General;  Laterality: N/A;  ? LEFT HEART CATH AND CORONARY ANGIOGRAPHY N/A 10/26/2019  ? Procedure: LEFT HEART CATH AND CORONARY ANGIOGRAPHY;  Surgeon: Burnell Blanks, MD;  Location: [place] CV LAB;  Service: Cardiovascular;  Laterality: N/A;  ? LEFT HEART CATHETERIZATION WITH CORONARY ANGIOGRAM N/A 12/25/2012  ? Procedure: LEFT HEART CATHETERIZATION WITH CORONARY ANGIOGRAM;  Surgeon: Sherren Mocha, MD;   Location: [facility] CATH LAB;  Service: Cardiovascular;  Laterality: N/A;  ? ROBOTIC ASSISTED TOTAL HYSTERECTOMY WITH BILATERAL SALPINGO OOPHERECTOMY N/A 12/06/2021  ? Procedure: XI ROBOTIC ASSISTED TOTAL HYSTERECTOMY WITH BILATERAL SALPINGO OOPHORECTOMY;  Surgeon: Lafonda Mosses, MD;  Location: [facility];  Service: Gynecology;  Laterality: N/A;  ? UMBILICAL HERNIA REPAIR  05/04/1989  ? doesn't think they used mesh  ? ? ?FAMILY HISTORY:  ?Family History  ?Problem Relation Age of Onset  ? Coronary artery disease Mother   ?     in her mid 88s  ? Hypertension Mother   ? Cancer Mother   ?     skin  ? Heart attack Father   ?     in his mid 32s  ? COPD Father   ? Stroke Sister   ? Coronary artery disease Sister   ? Diabetes Sister   ? Other Half-Sister   ? Prostate cancer Neg Hx   ? Colon cancer Neg Hx   ? Breast cancer Neg Hx   ? Endometrial cancer Neg Hx   ? Ovarian cancer Neg Hx   ? Pancreatic cancer Neg Hx   ? ? ?SOCIAL HISTORY:  ?Social History  ? ?Tobacco Use  ? Smoking status: Former  ?  Packs/day: 0.50  ?  Years: 1.00  ?  Pack years: 0.50  ?  Types: Cigarettes, Cigars  ?  Quit date: 09/03/1986  ?  Years since quitting: 35.3  ? Smokeless  tobacco: Never  ?Vaping Use  ? Vaping Use: Never used  ?Substance Use Topics  ? Alcohol use: No  ? Drug use: No  ? ? ?ALLERGIES:  ?Allergies  ?Allergen Reactions  ? Hydrocodone Shortness Of Breath  ?  "I forget to breathe."   ? Clopidogrel Bisulfate Rash  ? ? ?MEDICATIONS:  ?Current Outpatient Medications  ?Medication Sig Dispense Refill  ? aspirin 81 MG EC tablet Take 1 tablet (81 mg total) by mouth daily. 30 tablet 11  ? atorvastatin (LIPITOR) 80 MG tablet Take 1 tablet (80 mg total) by mouth every evening. 30 tablet 6  ? Blood Glucose Monitoring Suppl (TRUE METRIX METER) DEVI 1 each by Does not apply route 3 (three) times daily. 1 Device 0  ? empagliflozin (JARDIANCE) 10 MG TABS tablet Take 1 tablet (10 mg total) by mouth daily. 30 tablet 6  ? FLUoxetine (PROZAC) 20 MG capsule Take 1  capsule (20 mg total) by mouth daily. 30 capsule 6  ? furosemide (LASIX) 40 MG tablet Take 1 tablet (40 mg total) by mouth daily. 30 tablet 6  ? gabapentin (NEURONTIN) 300 MG capsule Take 2 capsules (600 mg total

## 2022-01-03 NOTE — Progress Notes (Signed)
See MD note for nursing evaluation. °

## 2022-01-04 NOTE — Progress Notes (Signed)
?Cardiology Office Note:   ? ?Date:  01/05/2022  ? ?ID:  Brenda [name], DOB Oct 05, 1960, MRN [id number] ? ?PCP:  Charlott Rakes, MD  ?[facility] Providers ?Cardiologist:  Sherren Mocha, MD    ?Referring MD: Charlott Rakes, MD  ? ?Chief Complaint:  Follow-up for CAD, CHF ?  ? ?Patient Profile: ?Coronary artery disease  ?S/p non-STEMI in 2000 tx w/ complex bifurcational LAD/Dx stenting ?Allergic to Plavix ?Cath 4/14: mod ISR in LAD and severe ISR in Dx >> med Rx ?S/p anterior STEMI 10/2019 >> Rx with DES to LAD (overlapping with old stent); chronic ost Dx stenosis (jailed by stent)  ?Myoview 3/23: Low risk ?(HFpEF) heart failure with preserved ejection fraction  ?RV Failure  ?Chronic respiratory failure ?Chronic O2 ?Diabetes mellitus, insulin dependent  ?OSA  ?Hypertension  ?Hyperlipidemia  ?Morbid obesity  ?Endometrial CA s/p Hysterectomy w BSO ?Admx with SBO 12/2021 ?  ?Prior CV studies: ?GATED SPECT MYO PERF W/LEXISCAN STRESS 2D 11/29/2021 ?Normal perfusion. LVEF 71% with normal wall motion. This is a low risk study. No prior for comparison. ?  ?ECHO COMPLETE WITH IMAGING ENHANCING AGENT 02/08/2021 ?EF 60-65, no RWMA, moderately reduced RVSF ? ?Echocardiogram 10/26/2019 ?EF 60-65, no RWMA, mild conc LVH, Gr 1 DD, normal RVSF, mild LAE ?  ?Cardiac catheterization 10/26/2019 ?LAD prox stent patent w/ 10 ISR, mid 100 (thrombotic); D1 stent 80 ISR, then 70 ?LCx prox 30 ?PCI:  2.5 x 28 mm Synergy DES to mid LAD ?  ? ?History of Present Illness:   ?Brenda [name] is a 61 y.o. female with the above problem list.  She was last seen in March 2023 by Laurann Montana, NP for surgical clearance prior to total hysterectomy and BSO for endometrial CA.  She developed small bowel obstruction due to incarcerated hernia after her hysterectomy requiring hernia repair.  She returns for cardiology follow-up.  She is doing well without chest pain.  Her breathing is stable.  She remains on chronic O2.  She has checked her O2  with oxygen off and it does drop into the high 80s with ambulation.  She sleeps with CPAP at night.  She has not had lower extremity edema, syncope.   ?   ?Past Medical History:  ?Diagnosis Date  ? Anemia   ? Arthritis   ? "back, hands, hips" (02/22/2015)  ? CAD (coronary artery disease)   ? a. complex LAD/diagonal bifurcation PCI in 2010. b. STEMI 10/2019 s/p PTCA/DES x1 to mLAD overlapping the old stent, residual disease treated medicaly.  ? Cancer [facility])   ? uterine  ? CHF (congestive heart failure) ([place])   ? Depression   ? Diabetes mellitus ([place])   ? "started when I was pregnant; not sure if it was type 1 or type 2"   ? Hypercholesterolemia   ? Hypertension   ? Morbid obesity ([place])   ? Myocardial infarction [facility])   ? mild x 3  ? Neuromuscular disorder ([place])   ? neuropathy feet  ? Sleep apnea   ? cpap/  ? ?Current Medications: ?Current Meds  ?Medication Sig  ? aspirin 81 MG EC tablet Take 1 tablet (81 mg total) by mouth daily.  ? atorvastatin (LIPITOR) 80 MG tablet Take 1 tablet (80 mg total) by mouth every evening.  ? Blood Glucose Monitoring Suppl (TRUE METRIX METER) DEVI 1 each by Does not apply route 3 (three) times daily.  ? empagliflozin (JARDIANCE) 10 MG TABS tablet Take 1 tablet (10 mg total) by mouth daily.  ?  FLUoxetine (PROZAC) 20 MG capsule Take 1 capsule (20 mg total) by mouth daily.  ? furosemide (LASIX) 40 MG tablet Take 1 tablet (40 mg total) by mouth daily.  ? gabapentin (NEURONTIN) 300 MG capsule Take 2 capsules (600 mg total) by mouth at bedtime.  ? glucose blood (TRUE METRIX BLOOD GLUCOSE TEST) test strip Use as instructed  ? insulin aspart (NOVOLOG) 100 UNIT/ML injection Inject 10-20 Units into the skin in the morning, at noon, and at bedtime. If BS<150=Take 10 units or If BS>150=Take 20 units  ? insulin detemir (LEVEMIR FLEXPEN) 100 UNIT/ML FlexPen Inject 25 Units into the skin 2 (two) times daily.  ? Insulin Pen Needle (NOVOFINE) 30G X 8 MM MISC Use as directed with Novolin 70/30 insulin  ?  Misc. Devices MISC Portable oxygen concentrator, 2L oxygen.  Diagnoses-chronic respiratory failure with hypoxia  ? Multiple Minerals (CALCIUM-MAGNESIUM-ZINC) TABS Take 1 tablet by mouth daily.  ? nitroGLYCERIN (NITROSTAT) 0.4 MG SL tablet Place 1 tablet (0.4 mg total) under the tongue every 5 (five) minutes as needed for chest pain (up to 3 doses. If taking 3rd dose call 911).  ? prasugrel (EFFIENT) 5 MG TABS tablet Take 1 tablet (5 mg total) by mouth daily.  ? Semaglutide,0.25 or 0.'5MG'$ /DOS, (OZEMPIC, 0.25 OR 0.5 MG/DOSE,) 2 MG/1.5ML SOPN Inject 0.5 mg into the skin once a week.  ? TRUEPLUS LANCETS 30G MISC 1 each by Does not apply route 3 (three) times daily.  ?  ?Allergies:   Hydrocodone and Clopidogrel bisulfate  ? ?Social History  ? ?Tobacco Use  ? Smoking status: Former  ?  Packs/day: 0.50  ?  Years: 1.00  ?  Pack years: 0.50  ?  Types: Cigarettes, Cigars  ?  Quit date: 09/03/1986  ?  Years since quitting: 35.3  ? Smokeless tobacco: Never  ?Vaping Use  ? Vaping Use: Never used  ?Substance Use Topics  ? Alcohol use: No  ? Drug use: No  ?  ?Family Hx: ?The patient's family history includes COPD in her father; Cancer in her mother; Coronary artery disease in her mother and sister; Diabetes in her sister; Heart attack in her father; Hypertension in her mother; Other in her half-sister; Stroke in her sister. There is no history of Prostate cancer, Colon cancer, Breast cancer, Endometrial cancer, Ovarian cancer, or Pancreatic cancer. ? ?Review of Systems  ?Gastrointestinal:  Negative for hematochezia.  ?Genitourinary:  Negative for hematuria.   ? ?EKGs/Labs/Other Test Reviewed:   ? ?EKG:  EKG is not ordered today.  The ekg ordered today demonstrates n/a ? ?Recent Labs: ?10/04/2021: NT-Pro BNP 103 ?10/18/2021: B Natriuretic Peptide 73.1; TSH 2.827 ?10/19/2021: Magnesium 2.2 ?12/09/2021: ALT 14 ?12/10/2021: BUN 15; Creatinine, Ser 0.91; Potassium 4.0; Sodium 135 ?12/11/2021: Hemoglobin 10.3; Platelets 336  ? ?Recent Lipid  Panel ?Recent Labs  ?  11/23/21 ?5053  ?CHOL 111  ?TRIG 126  ?HDL 39*  ?VLDL 25  ?Earth 47  ?  ? ?Risk Assessment/Calculations:   ?  ?    ?Physical Exam:   ? ?VS:  BP 130/60   Pulse 90   Ht '5\' 2"'$  (1.575 m)   Wt 282 lb 6.4 oz (128.1 kg)   LMP 10/30/2014 (Approximate)   SpO2 99%   BMI 51.65 kg/m?    ? ?Wt Readings from Last 3 Encounters:  ?01/05/22 282 lb 6.4 oz (128.1 kg)  ?01/01/22 273 lb (123.8 kg)  ?12/25/21 275 lb (124.7 kg)  ?  ?Constitutional:   ?   Appearance:  Healthy appearance. Not in distress.  ?Pulmonary:  ?   Effort: Pulmonary effort is normal.  ?   Breath sounds: No wheezing. No rales.  ?Cardiovascular:  ?   Normal rate. Regular rhythm. Normal S1. Normal S2.   ?   Murmurs: There is no murmur.  ?Edema: ?   Peripheral edema present. ?   Pretibial: bilateral trace edema of the pretibial area. ?Abdominal:  ?   Palpations: Abdomen is soft.  ?Musculoskeletal:  ?   Cervical back: Neck supple. Skin: ?   General: Skin is warm and dry.  ?Neurological:  ?   Mental Status: Alert and oriented to person, place and time.  ?   Cranial Nerves: Cranial nerves are intact.  ?  ?    ?ASSESSMENT & PLAN:   ?(HFpEF) heart failure with preserved ejection fraction ([place]) ?She also has right-sided heart failure.  Overall, her volume status is stable.  She does have fluctuation in her weight from time to time.  We discussed the importance of taking an extra dose of furosemide if her weight increases 3 pounds in 1 day or 5 pounds in 1 week.  GDMT includes furosemide, Entresto, Jardiance.  We could consider adding spironolactone at some point.  Recent creatinine, potassium normal.  Continue current management. ? ?Coronary Artery Disease ?History of non-STEMI in 2000 treated with complex bifurcational LAD/diagonal stenting and anterior STEMI in 2021 treated with a DES to the LAD overlapping with the old stent.  Myoview in March 2023 was low risk.  She is doing well without anginal symptoms.  Given multiple overlapping stents,  she should remain on long-term dual antiplatelet therapy.  Continue aspirin 81 mg daily, prasugrel 5 mg daily, atorvastatin 80 mg daily.  Follow-up in 6 months. ? ?Essential hypertension ?Blood pressure controlled.  Con

## 2022-01-05 ENCOUNTER — Encounter: Payer: Self-pay | Admitting: Physician Assistant

## 2022-01-05 ENCOUNTER — Other Ambulatory Visit: Payer: Self-pay

## 2022-01-05 ENCOUNTER — Ambulatory Visit (INDEPENDENT_AMBULATORY_CARE_PROVIDER_SITE_OTHER): Payer: Medicaid Other | Admitting: Physician Assistant

## 2022-01-05 VITALS — BP 130/60 | HR 90 | Ht 62.0 in | Wt 282.4 lb

## 2022-01-05 DIAGNOSIS — E785 Hyperlipidemia, unspecified: Secondary | ICD-10-CM

## 2022-01-05 DIAGNOSIS — I10 Essential (primary) hypertension: Secondary | ICD-10-CM

## 2022-01-05 DIAGNOSIS — Z9861 Coronary angioplasty status: Secondary | ICD-10-CM

## 2022-01-05 DIAGNOSIS — I5032 Chronic diastolic (congestive) heart failure: Secondary | ICD-10-CM

## 2022-01-05 DIAGNOSIS — G4733 Obstructive sleep apnea (adult) (pediatric): Secondary | ICD-10-CM

## 2022-01-05 DIAGNOSIS — I2510 Atherosclerotic heart disease of native coronary artery without angina pectoris: Secondary | ICD-10-CM | POA: Diagnosis not present

## 2022-01-05 DIAGNOSIS — E1169 Type 2 diabetes mellitus with other specified complication: Secondary | ICD-10-CM

## 2022-01-05 DIAGNOSIS — J9611 Chronic respiratory failure with hypoxia: Secondary | ICD-10-CM

## 2022-01-05 NOTE — Assessment & Plan Note (Signed)
She also has right-sided heart failure.  Overall, her volume status is stable.  She does have fluctuation in her weight from time to time.  We discussed the importance of taking an extra dose of furosemide if her weight increases 3 pounds in 1 day or 5 pounds in 1 week.  GDMT includes furosemide, Entresto, Jardiance.  We could consider adding spironolactone at some point.  Recent creatinine, potassium normal.  Continue current management. ?

## 2022-01-05 NOTE — Assessment & Plan Note (Signed)
LDL optimal.  Continue atorvastatin 80 mg daily. ?

## 2022-01-05 NOTE — Patient Instructions (Signed)
Medication Instructions:  ?Your physician recommends that you continue on your current medications as directed. Please refer to the Current Medication list given to you today.  ?*If you need a refill on your cardiac medications before your next appointment, please call your pharmacy* ? ? ?Lab Work: ?None ?If you have labs (blood work) drawn today and your tests are completely normal, you will receive your results only by: ?MyChart Message (if you have MyChart) OR ?A paper copy in the mail ?If you have any lab test that is abnormal or we need to change your treatment, we will call you to review the results. ? ?Follow-Up: ?At [facility], you and your health needs are our priority.  As part of our continuing mission to provide you with exceptional heart care, we have created designated Provider Care Teams.  These Care Teams include your primary Cardiologist (physician) and Advanced Practice Providers (APPs -  Physician Assistants and Nurse Practitioners) who all work together to provide you with the care you need, when you need it. ? ? ?Your next appointment:   ?6 month(s) ? ?The format for your next appointment:   ?In Person ? ?Provider:   ?Sherren Mocha, MD  or Richardson Dopp, PA-C       ? ? ?Other Instructions ?You have been referred to pulmonology. You will get a call to schedule an appointment. ? ? ?Important Information About Sugar ? ? ? ? ?  ?

## 2022-01-05 NOTE — Assessment & Plan Note (Addendum)
She has remained on oxygen since she was hospitalized last year for heart failure.  I suspect that her chronic respiratory failure with hypoxia is related to sleep apnea and possibly obesity hypoventilation syndrome.  She does not see pulmonology.  She did see Dr. Chase Caller once in 2014.  I will refer her to pulmonology for further evaluation and management. ?

## 2022-01-05 NOTE — Assessment & Plan Note (Signed)
Blood pressure controlled.  Continue furosemide 40 mg daily, Entresto 24/26 mg twice daily. ?

## 2022-01-05 NOTE — Assessment & Plan Note (Signed)
History of non-STEMI in 2000 treated with complex bifurcational LAD/diagonal stenting and anterior STEMI in 2021 treated with a DES to the LAD overlapping with the old stent.  Myoview in March 2023 was low risk.  She is doing well without anginal symptoms.  Given multiple overlapping stents, she should remain on long-term dual antiplatelet therapy.  Continue aspirin 81 mg daily, prasugrel 5 mg daily, atorvastatin 80 mg daily.  Follow-up in 6 months. ?

## 2022-01-08 ENCOUNTER — Other Ambulatory Visit: Payer: Self-pay

## 2022-01-08 NOTE — Progress Notes (Deleted)
History of Present Illness Brenda [name] is a 61 y.o. 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 Plan is for vaginal brachytherapy . She was  referred by Richardson Dopp [facility] for evaluation of sleep apnea. .    01/08/2022 Pt. Presents for  sleep consult evaluation . She was referred by Richardson Dopp PA-C Cardiology for what he suspects is OHS/OSA overlap in patient with chronic respiratory failure with hypoxia. She states she has been wearing   Test Results: Sleep questionnaire Symptoms-  Patient has sleep apnea, no current symptoms  Prior sleep study-   Bedtime-  Time to fall asleep-  Nocturnal awakenings-  Out of bed/start of day-  Weight changes-  Do you operate heavy machinery-  Do you currently wear CPAP-  Do you current wear oxygen-  [place]-       Latest Ref Rng & Units 12/11/2021    7:38 AM 12/10/2021    8:49 AM 12/09/2021    4:34 AM  CBC  WBC 4.0 - 10.5 K/uL 16.9   18.5   14.8    Hemoglobin 12.0 - 15.0 g/dL 10.3   10.6   11.3    Hematocrit 36.0 - 46.0 % 34.1   35.9   36.2    Platelets 150 - 400 K/uL 336   358   331         Latest Ref Rng & Units 12/10/2021    8:49 AM 12/09/2021    4:34 AM 12/08/2021    6:36 PM  BMP  Glucose 70 - 99 mg/dL 168   108   182    BUN 6 - 20 mg/dL '15   14   18    '$ Creatinine 0.44 - 1.00 mg/dL 0.91   0.81   0.93    Sodium 135 - 145 mmol/L 135   141   138    Potassium 3.5 - 5.1 mmol/L 4.0   3.9   4.4    Chloride 98 - 111 mmol/L 102   109   105    CO2 22 - 32 mmol/L '21   24   24    '$ Calcium 8.9 - 10.3 mg/dL 8.3   8.4   8.9      BNP    Component Value Date/Time   BNP 73.1 10/18/2021 2000    ProBNP    Component Value Date/Time   PROBNP 103 10/04/2021 0839    PROBNP 68.0 01/17/2011 1847    PFT No results found for: FEV1PRE, FEV1POST, FVCPRE, FVCPOST, TLC, DLCOUNC, PREFEV1FVCRT, PSTFEV1FVCRT  No results found.   Past medical hx Past Medical History:  Diagnosis Date   Anemia    Arthritis    "back, hands, hips" (02/22/2015)   CAD (coronary artery disease)    a. complex LAD/diagonal bifurcation PCI in 2010. b. STEMI 10/2019 s/p PTCA/DES x1 to mLAD overlapping the old stent, residual disease treated medicaly.   Cancer [facility])    uterine   CHF (congestive heart failure) ([place])    Depression    Diabetes mellitus ([place])    "started when I was pregnant; not sure if it was type 1 or type 2"    Hypercholesterolemia    Hypertension    Morbid obesity ([place])    Myocardial infarction ([place])    mild x  3   Neuromuscular disorder ([facility])    neuropathy feet   Sleep apnea    cpap/     Social History   Tobacco Use   Smoking status: Former    Packs/day: 0.50    Years: 1.00    Pack years: 0.50    Types: Cigarettes, Cigars    Quit date: 09/03/1986    Years since quitting: 35.3   Smokeless tobacco: Never  Vaping Use   Vaping Use: Never used  Substance Use Topics   Alcohol use: No   Drug use: No    Brenda [name] reports that she quit smoking about 35 years ago. Her smoking use included cigarettes and cigars. She has a 0.50 pack-year smoking history. She has never used smokeless tobacco. She reports that she does not drink alcohol and does not use drugs.  Tobacco Cessation: Counseling given: Not Answered   Past surgical hx, Family hx, Social hx all reviewed.  Current Outpatient Medications on File Prior to Visit  Medication Sig   aspirin 81 MG EC tablet Take 1 tablet (81 mg total) by mouth daily.   atorvastatin (LIPITOR) 80 MG tablet Take 1 tablet (80 mg total) by mouth every evening.   Blood Glucose Monitoring Suppl (TRUE METRIX METER) DEVI 1 each by Does not apply route 3 (three) times daily.   empagliflozin (JARDIANCE) 10 MG TABS tablet Take 1  tablet (10 mg total) by mouth daily.   FLUoxetine (PROZAC) 20 MG capsule Take 1 capsule (20 mg total) by mouth daily.   furosemide (LASIX) 40 MG tablet Take 1 tablet (40 mg total) by mouth daily.   gabapentin (NEURONTIN) 300 MG capsule Take 2 capsules (600 mg total) by mouth at bedtime.   glucose blood (TRUE METRIX BLOOD GLUCOSE TEST) test strip Use as instructed   insulin aspart (NOVOLOG) 100 UNIT/ML injection Inject 10-20 Units into the skin in the morning, at noon, and at bedtime. If BS<150=Take 10 units or If BS>150=Take 20 units   insulin detemir (LEVEMIR FLEXPEN) 100 UNIT/ML FlexPen Inject 25 Units into the skin 2 (two) times daily.   Insulin Pen Needle (NOVOFINE) 30G X 8 MM MISC Use as directed with Novolin 70/30 insulin   Misc. Devices MISC Portable oxygen concentrator, 2L oxygen.  Diagnoses-chronic respiratory failure with hypoxia   Multiple Minerals (CALCIUM-MAGNESIUM-ZINC) TABS Take 1 tablet by mouth daily.   nitroGLYCERIN (NITROSTAT) 0.4 MG SL tablet Place 1 tablet (0.4 mg total) under the tongue every 5 (five) minutes as needed for chest pain (up to 3 doses. If taking 3rd dose call 911).   prasugrel (EFFIENT) 5 MG TABS tablet Take 1 tablet (5 mg total) by mouth daily.   sacubitril-valsartan (ENTRESTO) 24-26 MG Take 1 tablet by mouth 2 (two) times daily. (Patient not taking: Reported on 01/05/2022)   Semaglutide,0.25 or 0.'5MG'$ /DOS, (OZEMPIC, 0.25 OR 0.5 MG/DOSE,) 2 MG/1.5ML SOPN Inject 0.5 mg into the skin once a week.   TRUEPLUS LANCETS 30G MISC 1 each by Does not apply route 3 (three) times daily.   No current facility-administered medications on file prior to visit.     Allergies  Allergen Reactions   Hydrocodone Shortness Of Breath    "I forget to breathe."    Clopidogrel Bisulfate Rash    Review Of Systems:  Constitutional:   No  weight loss, night sweats,  Fevers, chills, fatigue, or  lassitude.  HEENT:   No headaches,  Difficulty swallowing,  Tooth/dental problems, or   Sore throat,  No sneezing, itching, ear ache, nasal congestion, post nasal drip,   CV:  No chest pain,  Orthopnea, PND, swelling in lower extremities, anasarca, dizziness, palpitations, syncope.   GI  No heartburn, indigestion, abdominal pain, nausea, vomiting, diarrhea, change in bowel habits, loss of appetite, bloody stools.   Resp: No shortness of breath with exertion or at rest.  No excess mucus, no productive cough,  No non-productive cough,  No coughing up of blood.  No change in color of mucus.  No wheezing.  No chest wall deformity  Skin: no rash or lesions.  GU: no dysuria, change in color of urine, no urgency or frequency.  No flank pain, no hematuria   MS:  No joint pain or swelling.  No decreased range of motion.  No back pain.  Psych:  No change in mood or affect. No depression or anxiety.  No memory loss.   Vital Signs LMP 10/30/2014 (Approximate)    Physical Exam:  General- No distress,  A&Ox3 ENT: No sinus tenderness, TM clear, pale nasal mucosa, no oral exudate,no post nasal drip, no LAN Cardiac: S1, S2, regular rate and rhythm, no murmur Chest: No wheeze/ rales/ dullness; no accessory muscle use, no nasal flaring, no sternal retractions Abd.: Soft Non-tender Ext: No clubbing cyanosis, edema Neuro:  normal strength Skin: No rashes, warm and dry Psych: normal mood and behavior   Assessment/Plan  No problem-specific Assessment & Plan notes found for this encounter.    Magdalen Spatz, NP 01/08/2022  11:34 AM

## 2022-01-09 ENCOUNTER — Institutional Professional Consult (permissible substitution): Payer: Medicaid Other | Admitting: Acute Care

## 2022-01-09 ENCOUNTER — Other Ambulatory Visit: Payer: Self-pay

## 2022-01-10 ENCOUNTER — Other Ambulatory Visit: Payer: Self-pay

## 2022-01-11 ENCOUNTER — Other Ambulatory Visit: Payer: Self-pay

## 2022-01-16 ENCOUNTER — Other Ambulatory Visit: Payer: Self-pay

## 2022-01-18 ENCOUNTER — Encounter: Payer: Self-pay | Admitting: Primary Care

## 2022-01-18 ENCOUNTER — Ambulatory Visit (INDEPENDENT_AMBULATORY_CARE_PROVIDER_SITE_OTHER): Payer: Medicaid Other | Admitting: Primary Care

## 2022-01-18 ENCOUNTER — Telehealth: Payer: Self-pay | Admitting: *Deleted

## 2022-01-18 VITALS — BP 128/68 | HR 99 | Temp 98.8°F | Ht 62.0 in | Wt 281.0 lb

## 2022-01-18 DIAGNOSIS — J9611 Chronic respiratory failure with hypoxia: Secondary | ICD-10-CM | POA: Diagnosis not present

## 2022-01-18 DIAGNOSIS — G4733 Obstructive sleep apnea (adult) (pediatric): Secondary | ICD-10-CM

## 2022-01-18 NOTE — Progress Notes (Signed)
$'@Patient'v$  ID: Brenda [name], female    DOB: 03/04/1961, 61 y.o.   MRN: [id number]  Chief Complaint  Patient presents with   Consult    Pt is here for a sleep consult for her sleep apnea. Pt states she is unsure if its Bipap or cpap machine at home. Last sleep study was done 25 years ago. Pt got heart damage when she refused machine in the past and then when in hospital was placed on machine. She states she feels much better sleeping at night.     Referring provider: Sharmon Revere  HPI: 61 year old, former light smoker quit in 1988. PMH significant for OSA on BIPAP, chronic respiratory failure, heart failure, CAD, HTN, myocardial infarction, type 2 diabetes, hyperlipidemia, endometrial cancer, morbid obesity. Former patient of Dr. Chase Caller, last seen in July 2014.  01/18/2022 Patient presents today for sleep consult. She is currently on auto PAP 4-20cm h20. She has been on therapy since June 2022. No current issues sleeping. She reports minimal snoring. She wears 2L oxygen 24/7. Typical bedtime is 10-11pm. Takes about an hour to fall asleep. She wakes up several times to use the bathroom d/t lasix. She starts her day 7:30am. No residual daytime sleepiness. DME [employer].   She has chronic dyspnea with exertion. She has no acute respiratory complaints. She is working on weight loss, taking ozempic. Hx endometrial cancer. She follows with Dr. Berline Lopes and Dr. Sondra Come. She had hysterectomy end of April. She had a bowel obstruction after surgery. Starting radiation on Monday, she will need 5 treatments. She is working on being more active.   Sleep questionnaire Symptoms- History of sleep apnea; minimal snoring   Prior sleep study- Not in chart >59 years old  Bedtime- 10-11pm Time to fall asleep- an hour Nocturnal awakenings- 4-5 times  Out of bed/start of day- 7:30am Weight changes- 10 lbs Do you operate heavy machinery- no Do you currently wear CPAP- yes Do you current wear oxygen-  yes, 2L [place]- 3  Allergies  Allergen Reactions   Hydrocodone Shortness Of Breath    "I forget to breathe."    Clopidogrel Bisulfate Rash    Immunization History  Administered Date(s) Administered   Influenza,inj,Quad PF,6+ Mos 10/27/2019, 06/26/2021   Pneumococcal Polysaccharide-23 10/27/2019   Pneumococcal-Unspecified 05/04/2009    Past Medical History:  Diagnosis Date   Anemia    Arthritis    "back, hands, hips" (02/22/2015)   CAD (coronary artery disease)    a. complex LAD/diagonal bifurcation PCI in 2010. b. STEMI 10/2019 s/p PTCA/DES x1 to mLAD overlapping the old stent, residual disease treated medicaly.   Cancer ([place])    uterine   CHF (congestive heart failure) ([place])    Depression    Diabetes mellitus ([place])    "started when I was pregnant; not sure if it was type 1 or type 2"    Hypercholesterolemia    Hypertension    Morbid obesity ([place])    Myocardial infarction ([place])    mild x 3   Neuromuscular disorder ([place])    neuropathy feet   Sleep apnea    cpap/    Tobacco History: Social History   Tobacco Use  Smoking Status Former   Packs/day: 0.50   Years: 1.00   Total pack years: 0.50   Types: Cigarettes, Cigars   Quit date: 09/03/1986   Years since quitting: 35.4  Smokeless Tobacco Never   Counseling given: Not Answered   Outpatient Medications Prior to Visit  Medication Sig Dispense Refill   aspirin 81 MG EC tablet Take 1 tablet (81 mg total) by mouth daily. 30 tablet 11   atorvastatin (LIPITOR) 80 MG tablet Take 1 tablet (80 mg total) by mouth every evening. 30 tablet 6   Blood Glucose Monitoring Suppl (TRUE METRIX METER) DEVI 1 each by Does not apply route 3 (three) times daily. 1 Device 0   empagliflozin (JARDIANCE) 10 MG TABS tablet Take 1 tablet (10 mg total) by mouth daily. 30 tablet 6   FLUoxetine (PROZAC) 20 MG capsule Take 1 capsule (20 mg total) by mouth daily. 30 capsule 6   gabapentin (NEURONTIN) 300 MG capsule Take 2 capsules (600 mg  total) by mouth at bedtime. 60 capsule 6   glucose blood (TRUE METRIX BLOOD GLUCOSE TEST) test strip Use as instructed 100 each 5   insulin aspart (NOVOLOG) 100 UNIT/ML injection Inject 10-20 Units into the skin in the morning, at noon, and at bedtime. If BS<150=Take 10 units or If BS>150=Take 20 units     insulin detemir (LEVEMIR FLEXPEN) 100 UNIT/ML FlexPen Inject 25 Units into the skin 2 (two) times daily.     Insulin Pen Needle (NOVOFINE) 30G X 8 MM MISC Use as directed with Novolin 70/30 insulin 100 each 1   Misc. Devices MISC Portable oxygen concentrator, 2L oxygen.  Diagnoses-chronic respiratory failure with hypoxia 1 each 0   Multiple Minerals (CALCIUM-MAGNESIUM-ZINC) TABS Take 1 tablet by mouth daily.     nitroGLYCERIN (NITROSTAT) 0.4 MG SL tablet Place 1 tablet (0.4 mg total) under the tongue every 5 (five) minutes as needed for chest pain (up to 3 doses. If taking 3rd dose call 911). 25 tablet 3   prasugrel (EFFIENT) 5 MG TABS tablet Take 1 tablet (5 mg total) by mouth daily. 30 tablet 6   sacubitril-valsartan (ENTRESTO) 24-26 MG Take 1 tablet by mouth 2 (two) times daily. 180 tablet 3   Semaglutide,0.25 or 0.'5MG'$ /DOS, (OZEMPIC, 0.25 OR 0.5 MG/DOSE,) 2 MG/1.5ML SOPN Inject 0.5 mg into the skin once a week. 1.5 mL 6   TRUEPLUS LANCETS 30G MISC 1 each by Does not apply route 3 (three) times daily. 100 each 5   furosemide (LASIX) 40 MG tablet Take 1 tablet (40 mg total) by mouth daily. 30 tablet 6   No facility-administered medications prior to visit.      Review of Systems  Review of Systems  Constitutional: Negative.   HENT: Negative.    Respiratory:  Negative for cough, chest tightness, shortness of breath and wheezing.   Cardiovascular: Negative.      Physical Exam  BP 128/68 (BP Location: Left Arm, Patient Position: Sitting, Cuff Size: Normal)   Pulse 99   Temp 98.8 F (37.1 C) (Oral)   Ht '5\' 2"'$  (1.575 m)   Wt 281 lb (127.5 kg)   LMP 10/30/2014 (Approximate)   SpO2  100%   BMI 51.40 kg/m  Physical Exam Constitutional:      General: She is not in acute distress.    Appearance: She is obese. She is not ill-appearing.  Cardiovascular:     Rate and Rhythm: Normal rate and regular rhythm.     Comments: No edema Pulmonary:     Effort: Pulmonary effort is normal.     Breath sounds: Normal breath sounds.     Comments: 2L oxygen  Musculoskeletal:        General: Normal range of motion.  Skin:    General: Skin is warm and dry.  Neurological:     General: No focal deficit present.     Mental Status: She is alert and oriented to person, place, and time. Mental status is at baseline.  Psychiatric:        Mood and Affect: Mood normal.        Behavior: Behavior normal.        Thought Content: Thought content normal.        Judgment: Judgment normal.      Lab Results:  CBC    Component Value Date/Time   WBC 16.9 (H) 12/11/2021 0738   RBC 3.96 12/11/2021 0738   HGB 10.3 (L) 12/11/2021 0738   HGB 12.2 11/01/2021 1624   HCT 34.1 (L) 12/11/2021 0738   HCT 39.0 11/01/2021 1624   PLT 336 12/11/2021 0738   PLT 449 11/01/2021 1624   MCV 86.1 12/11/2021 0738   MCV 84 11/01/2021 1624   MCH 26.0 12/11/2021 0738   MCHC 30.2 12/11/2021 0738   RDW 16.9 (H) 12/11/2021 0738   RDW 18.0 (H) 11/01/2021 1624   LYMPHSABS 1.5 12/08/2021 1836   LYMPHSABS 2.3 11/01/2021 1624   MONOABS 0.9 12/08/2021 1836   EOSABS 0.2 12/08/2021 1836   EOSABS 0.2 11/01/2021 1624   BASOSABS 0.0 12/08/2021 1836   BASOSABS 0.1 11/01/2021 1624    BMET    Component Value Date/Time   NA 135 12/10/2021 0849   NA 141 10/04/2021 0839   K 4.0 12/10/2021 0849   CL 102 12/10/2021 0849   CO2 21 (L) 12/10/2021 0849   GLUCOSE 168 (H) 12/10/2021 0849   BUN 15 12/10/2021 0849   BUN 16 10/04/2021 0839   CREATININE 0.91 12/10/2021 0849   CREATININE 0.83 03/29/2016 1030   CALCIUM 8.3 (L) 12/10/2021 0849   GFRNONAA >60 12/10/2021 0849   GFRNONAA 81 03/29/2016 1030   GFRAA 94  12/16/2019 1041   GFRAA >89 03/29/2016 1030    BNP    Component Value Date/Time   BNP 73.1 10/18/2021 2000    ProBNP    Component Value Date/Time   PROBNP 103 10/04/2021 0839   PROBNP 68.0 01/17/2011 1847    Imaging: No results found.   Assessment & Plan:   OSA treated with BiPAP - Continue to wear BiPAP while sleeping aim for 4 to 6 hours of use or more - We have requested a compliance report from [employer] to review current pressure setting and evaluate if any changes are needed   Chronic respiratory failure (HCC) - Continue 2L oxygen 24/7 and blended into BIPAP  - Patient walked in office today and was able to maintain O2 >90% on portable oxygen concentrator at [place], NP 02/11/2022

## 2022-01-18 NOTE — Patient Instructions (Addendum)
Recommendations - Continue to wear BiPAP while sleeping aim for 4 to 6 hours of use or more - Continue weight loss efforts - Continue oxygen at 2 L - We have requested a compliance report from [employer] to review, if any changes are needed Rester settings I will contact you  Orders - Qualifying walk for POC  Follow-up - 3 months with Dr. Halford Chessman- 30 min (new patient chronic respiratory failure and OSA)  Sleep Apnea Sleep apnea affects breathing during sleep. It causes breathing to stop for 10 seconds or more, or to become shallow. People with sleep apnea usually snore loudly. It can also increase the risk of: Heart attack. Stroke. Being very overweight (obese). Diabetes. Heart failure. Irregular heartbeat. High blood pressure. The goal of treatment is to help you breathe normally again. What are the causes?  The most common cause of this condition is a collapsed or blocked airway. There are three kinds of sleep apnea: Obstructive sleep apnea. This is caused by a blocked or collapsed airway. Central sleep apnea. This happens when the brain does not send the right signals to the muscles that control breathing. Mixed sleep apnea. This is a combination of obstructive and central sleep apnea. What increases the risk? Being overweight. Smoking. Having a small airway. Being older. Being female. Drinking alcohol. Taking medicines to calm yourself (sedatives or tranquilizers). Having family members with the condition. Having a tongue or tonsils that are larger than normal. What are the signs or symptoms? Trouble staying asleep. Loud snoring. Headaches in the morning. Waking up gasping. Dry mouth or sore throat in the morning. Being sleepy or tired during the day. If you are sleepy or tired during the day, you may also: Not be able to focus your mind (concentrate). Forget things. Get angry a lot and have mood swings. Feel sad (depressed). Have changes in your personality. Have less  interest in sex, if you are female. Be unable to have an erection, if you are female. How is this treated?  Sleeping on your side. Using a medicine to get rid of mucus in your nose (decongestant). Avoiding the use of alcohol, medicines to help you relax, or certain pain medicines (narcotics). Losing weight, if needed. Changing your diet. Quitting smoking. Using a machine to open your airway while you sleep, such as: An oral appliance. This is a mouthpiece that shifts your lower jaw forward. A CPAP device. This device blows air through a mask when you breathe out (exhale). An EPAP device. This has valves that you put in each nostril. A BIPAP device. This device blows air through a mask when you breathe in (inhale) and breathe out. Having surgery if other treatments do not work. Follow these instructions at home: Lifestyle Make changes that your doctor recommends. Eat a healthy diet. Lose weight if needed. Avoid alcohol, medicines to help you relax, and some pain medicines. Do not smoke or use any products that contain nicotine or tobacco. If you need help quitting, ask your doctor. General instructions Take over-the-counter and prescription medicines only as told by your doctor. If you were given a machine to use while you sleep, use it only as told by your doctor. If you are having surgery, make sure to tell your doctor you have sleep apnea. You may need to bring your device with you. Keep all follow-up visits. Contact a doctor if: The machine that you were given to use during sleep bothers you or does not seem to be working. You do not  get better. You get worse. Get help right away if: Your chest hurts. You have trouble breathing in enough air. You have an uncomfortable feeling in your back, arms, or stomach. You have trouble talking. One side of your body feels weak. A part of your face is hanging down. These symptoms may be an emergency. Get help right away. Call your local  emergency services (911 in the [place] Do not wait to see if the symptoms will go away. Do not drive yourself to the hospital. Summary This condition affects breathing during sleep. The most common cause is a collapsed or blocked airway. The goal of treatment is to help you breathe normally while you sleep. This information is not intended to replace advice given to you by your health care provider. Make sure you discuss any questions you have with your health care provider. Document Revised: 03/29/2021 Document Reviewed: 07/29/2020 Elsevier Patient Education  [place].  Chronic Respiratory Failure  Respiratory failure is a condition in which the lungs do not work well and the breathing (respiratory) system fails. When respiratory failure occurs, it becomes difficult for the lungs to get enough oxygen or to eliminate carbon dioxide (or both). If the lungs do not work properly, the heart, brain, and other body systems do not get enough oxygen. Respiratory failure is life-threatening if not treated. Respiratory failure can be acute or chronic. Acute respiratory failure is sudden and severe and requires emergency medical treatment. Chronic respiratory failure happens over time--months to years--and is usually due to a medical condition that gets worse. What are the causes? This condition may be caused by any problem that affects the heart or lungs. Causes include: Lung or airway disease, such as: Chronic bronchitis and emphysema (COPD). Asthma. Cystic fibrosis. Pulmonary hypertension. Pulmonary fibrosis. This is scarring of the lung tissue. Infection, such as pneumonia. Nerve or muscle injury or diseases that make chest movements difficult, such as: Lou Gehrig's disease. Guillain-Barre syndrome. Stroke. Spinal cord injuries. Fluid in the lungs (pulmonary edema). This may be due to heart failure or lung injury. A blood clot in a lung (pulmonary embolism). Trauma to the chest that  makes breathing difficult. This may include a collapsed lung (pneumothorax). What increases the risk? You are more likely to develop this condition if: You smoke or vape, have a history of smoking or vaping, or have exposure to secondhand smoke. You have a weak immune system. You have a family history of breathing problems or lung disease. You have sleep apnea. You have congestive heart failure. You are obese. You have been exposed to hazardous substances at work, such as chemicals, asbestos, industrial dyes, or chemical fumes. What are the signs or symptoms? Symptoms of this condition include: Shortness of breath or difficulty breathing. A cough with or without mucus (sputum). Wheezing. Chest pain or tightness. A bluish color to the fingernail or toenail beds. Confusion. Drowsiness or feeling tired easily, especially with minimal activity. How is this diagnosed? This condition may be diagnosed based on: Your medical history. A physical exam. Other tests, such as: Imaging tests. These may include a chest X-ray or CT scan. Blood tests, such as an arterial blood gas test. This test is done to check if you have enough oxygen in your blood or if your carbon dioxide levels are too high. An electrocardiogram. This test records the electrical activity of your heart. An echocardiogram. This test uses sound waves to make an image of your heart. Pulmonary function tests. These help to determine  if you have chronic lung disease. Bronchoscopy. During this test, your health care provider uses a tube with a camera to look inside your airways for signs of inflammation or other problems. How is this treated? Treatment for this condition depends on the cause. Treatment may include: Breathing in oxygen through a tube with prongs that sit in your nose (nasal cannula) or through a mask that fits over your face. Receiving noninvasive positive pressure ventilation, called CPAP or BIPAP. This is a method  of breathing support in which a machine blows air into your lungs through a mask. Medicines to help with breathing, such as: Medicines that open up and relax air passages, such as bronchodilators. These may be given through a device that turns liquid medicines into a mist you can breathe in (nebulizer). These medicines help with breathing. Diuretics. These medicines get rid of extra fluid in your lungs, which can help you breathe better. Steroid medicines. These decrease inflammation in the lungs. Antibiotic medicines. These may be given to treat a bacterial infection, such as pneumonia. Blood thinners (anticoagulants) to treat blood clots in the lungs. Pulmonary rehabilitation. This is an exercise program that strengthens the muscles in your chest and helps you learn breathing techniques to manage your condition. Using a ventilator. This is a breathing machine that delivers oxygen to the lungs through a breathing tube that is put into the trachea. This machine is used when you can no longer breathe well enough on your own. Follow these instructions at home: Medicines Take over-the-counter and prescription medicines only as told by your health care provider. If you were prescribed an antibiotic medicine, take it as told by your health care provider. Do not stop taking the antibiotic even if you start to feel better. Lifestyle Do not use any products that contain nicotine or tobacco, such as cigarettes, e-cigarettes, and chewing tobacco. If you need help quitting, ask your health care provider. Avoid secondhand smoke. Avoid exposure to irritants that make your breathing problems worse. These include smoke, chemicals, and fumes. General instructions Use oxygen therapy and do pulmonary rehabilitation if directed by your health care provider. If you require home oxygen therapy, ask your health care provider whether you should purchase a pulse oximeter to measure your oxygen level at home. If you were  given a CPAP machine, make sure that you understand how to use, clean, and care for the machine and that you use it as directed. Stay active, but balance activity with periods of rest. Exercise and physical activity will help you maintain your ability to do things you want to do. Stay up to date on all vaccines, especially pneumonia and yearly flu (influenza) vaccines. Avoid people who are sick and avoid crowded places during the flu season. Work with your health care provider to create a plan to help you deal with your condition. Follow this plan. Keep all follow-up visits as told by your health care provider. This is important. Contact a health care provider if: Your shortness of breath gets worse and you cannot do the things you used to do. You have increased sputum, wheezing, coughing, or loss of energy. You are on oxygen therapy and you are starting to need more. You need to use your medicines more often. You have a fever of 100.37F (38C) or higher. Get help right away if: Your shortness of breath becomes suddenly or significantly worse. You develop chest pain, tightness, or pressure. You are unable to say more than a few words without having  to catch your breath. Your lips, toenails, or fingernails are a bluish color. You become confused or difficult to wake up. These symptoms may represent a serious problem that is an emergency. Do not wait to see if the symptoms will go away. Get medical help right away. Call your local emergency services (911 in the [place] Do not drive yourself to the hospital. Summary Respiratory failure is a condition in which the lungs do not work well and the breathing system fails. This condition can be very serious and is often life-threatening. Chronic respiratory failure has many causes, including COPD, lung infections, and fluid in the lungs. Chronic respiratory failure is lifelong, but its acute symptoms can be very dangerous. This condition is diagnosed  with specific tests and can be treated with medicines, oxygen, or both. Contact a health care provider if your shortness of breath gets worse, you develop chest pain or fever, or you need to use your oxygen or medicines more often than before. This information is not intended to replace advice given to you by your health care provider. Make sure you discuss any questions you have with your health care provider. Document Revised: 03/29/2021 Document Reviewed: 09/25/2019 Elsevier Patient Education  [place].

## 2022-01-18 NOTE — Telephone Encounter (Signed)
CALLED PATIENT TO INFORM OF NEW HDR [place] FOR 01-22-22, SPOKE WITH PATIENT AND SHE IS AWARE OF THESE APPTS.

## 2022-01-19 NOTE — Progress Notes (Signed)
  Radiation Oncology         [phone]) [phone] ________________________________  Name: Brenda [name] MRN: [id number]  Date: 01/22/2022  DOB: [birth year]  CC: Charlott Rakes, MD  Charlott Rakes, MD  HDR BRACHYTHERAPY NOTE  DIAGNOSIS: The encounter diagnosis was Endometrial adenocarcinoma ([place]).   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 She will be treated with a 2.5 cm diameter segmented cylinder. This conforms to her anatomy without undue discomfort.  Vaginal brachytherapy procedure node: The patient was brought to the [place] suite. Identity was confirmed. All relevant records and images related to the planned course of therapy were reviewed. The patient freely provided informed written consent to proceed with treatment after reviewing the details related to the planned course of therapy. The consent form was witnessed and verified by the simulation staff. Then, the patient was set-up in a stable reproducible supine position for radiation therapy. Pelvic exam revealed the vaginal cuff to be intact . The patient's custom vaginal cylinder was placed in the proximal vagina. This was affixed to the CT/MR stabilization plate to prevent slippage. Patient tolerated the placement well.  Verification simulation note:  A fiducial marker was placed within the vaginal cylinder. An AP and lateral film was then obtained through the pelvis area. This documented accurate position of the vaginal cylinder for treatment.  HDR BRACHYTHERAPY TREATMENT  The remote afterloading device was affixed to the vaginal cylinder by catheter. Patient then proceeded to undergo her first high-dose-rate treatment directed at the proximal vagina. The patient was prescribed a dose of 6.0 gray to be delivered to the mucosal surface. Treatment  length was 3.0 cm. Patient was treated with 1 channel using 7 dwell positions. Treatment time was 192.1 seconds. Iridium 192 was the high-dose-rate source for treatment. The patient tolerated the treatment well. After completion of her therapy, a radiation survey was performed documenting return of the iridium source into the [employer] safe.   PLAN: The patient will return return in several days for her second high-dose-rate treatment.  ________________________________  -----------------------------------  Blair Promise, PhD, MD  This document serves as a record of services personally performed by Gery Pray, MD. It was created on his behalf by Roney Mans, a trained medical scribe. The creation of this record is based on the scribe's personal observations and the provider's statements to them. This document has been checked and approved by the attending provider.

## 2022-01-19 NOTE — Progress Notes (Signed)
Radiation Oncology         [phone] ________________________________  Name: Brenda [name] MRN: [id number]  Date: 01/22/2022  DOB: 06-30-1961  Vaginal Brachytherapy Procedure Note  CC: Charlott Rakes, MD Lafonda Mosses, MD    ICD-10-CM   1. 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  She denies any new medical issues since her consultation appointment.  She denies any vaginal bleeding or vaginal discharge at this time.  She reports normal bladder and bowel function.  She did see Dr. Berline Lopes and was felt to be healing appropriately after her surgery.  No significant interval history since the patient was last seen for consultation.  She denies any vaginal bleeding or discharge.  She reports normal bladder and bowel function.  ALLERGIES: is allergic to hydrocodone and clopidogrel bisulfate.  Meds: Current Outpatient Medications  Medication Sig Dispense Refill   aspirin 81 MG EC tablet Take 1 tablet (81 mg total) by mouth daily. 30 tablet 11   atorvastatin (LIPITOR) 80 MG tablet Take 1 tablet (80 mg total) by mouth every evening. 30 tablet 6   Blood Glucose Monitoring Suppl (TRUE METRIX METER) DEVI 1 each by Does not apply route 3 (three) times daily. 1 Device 0   empagliflozin (JARDIANCE) 10 MG TABS tablet Take 1 tablet (10 mg total) by mouth daily. 30 tablet 6   FLUoxetine (PROZAC) 20 MG capsule Take 1 capsule (20 mg total) by mouth daily. 30 capsule 6   furosemide (LASIX) 40 MG tablet Take 1 tablet (40 mg total) by mouth daily. 30 tablet 6   gabapentin (NEURONTIN) 300 MG capsule Take 2 capsules (600 mg total) by mouth at bedtime. 60 capsule 6   glucose blood (TRUE METRIX BLOOD GLUCOSE TEST) test strip Use as instructed 100 each 5   insulin aspart  (NOVOLOG) 100 UNIT/ML injection Inject 10-20 Units into the skin in the morning, at noon, and at bedtime. If BS<150=Take 10 units or If BS>150=Take 20 units     insulin detemir (LEVEMIR FLEXPEN) 100 UNIT/ML FlexPen Inject 25 Units into the skin 2 (two) times daily.     Insulin Pen Needle (NOVOFINE) 30G X 8 MM MISC Use as directed with Novolin 70/30 insulin 100 each 1   Misc. Devices MISC Portable oxygen concentrator, 2L oxygen.  Diagnoses-chronic respiratory failure with hypoxia 1 each 0   Multiple Minerals (CALCIUM-MAGNESIUM-ZINC) TABS Take 1 tablet by mouth daily.     nitroGLYCERIN (NITROSTAT) 0.4 MG SL tablet Place 1 tablet (0.4 mg total) under the tongue every 5 (five) minutes as needed for chest pain (up to 3 doses. If taking 3rd dose call 911). 25 tablet 3   prasugrel (EFFIENT) 5 MG TABS tablet Take 1 tablet (5 mg total) by mouth daily. 30 tablet 6   sacubitril-valsartan (ENTRESTO) 24-26 MG Take 1 tablet by mouth 2 (two) times daily. 180 tablet 3   Semaglutide,0.25 or 0.'5MG'$ /DOS, (OZEMPIC, 0.25 OR 0.5 MG/DOSE,) 2 MG/1.5ML SOPN Inject 0.5 mg into the skin once a week. 1.5 mL 6   TRUEPLUS LANCETS 30G MISC 1 each by Does not apply route 3 (three) times daily. 100 each 5   No current facility-administered medications for this encounter.    Physical Findings: The patient is in no acute distress. Patient is alert and oriented.  height  is '5\' 2"'$  (1.575 m) and weight is 282 lb (127.9 kg). Her temperature is 97.7 F (36.5 C). Her blood pressure is 111/52 (abnormal) and her pulse is 83. Her respiration is 20 and oxygen saturation is 100%.   No palpable cervical, supraclavicular or axillary lymphoadenopathy. The heart has a regular rate and rhythm. The lungs are clear to auscultation. Abdomen soft and non-tender.  On pelvic examination the external genitalia were unremarkable. A speculum exam was performed. Vaginal cuff intact, no mucosal lesions.  No visible sutures at this time.  On bimanual exam  there were no pelvic masses appreciated.   Lab Findings: Lab Results  Component Value Date   WBC 16.9 (H) 12/11/2021   HGB 10.3 (L) 12/11/2021   HCT 34.1 (L) 12/11/2021   MCV 86.1 12/11/2021   PLT 336 12/11/2021    Radiographic Findings: No results found.  Impression: The encounter diagnosis was Endometrial adenocarcinoma ([place]).   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 The patient will be treated with a 2.5 cm diameter cylinder with a treatment length of 3.0 cm. This distended the vaginal vault without undue discomfort. The patient tolerated the procedure well.  The patient was successfully fitted for a vaginal cylinder. The patient is appropriate to begin vaginal brachytherapy.   Plan: The patient will proceed with CT simulation and vaginal brachytherapy today.    _______________________________   Blair Promise, PhD, MD  This document serves as a record of services personally performed by Gery Pray, MD. It was created on his behalf by Roney Mans, a trained medical scribe. The creation of this record is based on the scribe's personal observations and the provider's statements to them. This document has been checked and approved by the attending provider.

## 2022-01-22 ENCOUNTER — Ambulatory Visit
Admission: RE | Admit: 2022-01-22 | Discharge: 2022-01-22 | Disposition: A | Discharge status: Home / Self Care | Payer: Medicaid Other | Source: Ambulatory Visit | Attending: Radiation Oncology | Admitting: Radiation Oncology

## 2022-01-22 ENCOUNTER — Other Ambulatory Visit: Payer: Self-pay

## 2022-01-22 ENCOUNTER — Encounter: Payer: Self-pay | Admitting: Radiation Oncology

## 2022-01-22 DIAGNOSIS — C541 Malignant neoplasm of endometrium: Secondary | ICD-10-CM | POA: Insufficient documentation

## 2022-01-22 DIAGNOSIS — Z79899 Other long term (current) drug therapy: Secondary | ICD-10-CM | POA: Insufficient documentation

## 2022-01-22 DIAGNOSIS — Z923 Personal history of irradiation: Secondary | ICD-10-CM | POA: Diagnosis not present

## 2022-01-22 LAB — RAD ONC ARIA SESSION SUMMARY
Course Elapsed Days: 0
Plan Fractions Treated to Date: 1
Plan Prescribed Dose Per Fraction: 6 Gy
Plan Total Fractions Prescribed: 5
Plan Total Prescribed Dose: 30 Gy
Reference Point Dosage Given to Date: 6 Gy
Reference Point Session Dosage Given: 6 Gy
Session Number: 1

## 2022-01-22 NOTE — Progress Notes (Signed)
Brenda [name] is here today for new vaginal cylinder.   Does the patient complain of any of the following:  Pain:No Abdominal bloating: Patient states her abdomen is larger on the right side.  Diarrhea/Constipation: No Nausea/Vomiting: No Vaginal Discharge: No Blood in Urine or Stool: No Urinary Issues (dysuria/incomplete emptying/ incontinence/ increased frequency/urgency): No    Additional comments if applicable:    BP (!) 063/49 (BP Location: Left Arm, Patient Position: Sitting, Cuff Size: Normal)   Pulse 83   Temp 97.7 F (36.5 C)   Resp 20   Ht '5\' 2"'$  (1.575 m)   Wt 282 lb (127.9 kg)   LMP 10/30/2014 (Approximate)   SpO2 100%   PF (!) 2 L/min   BMI 51.58 kg/m

## 2022-01-23 ENCOUNTER — Ambulatory Visit: Payer: Medicaid Other | Admitting: Radiation Oncology

## 2022-01-23 ENCOUNTER — Ambulatory Visit: Payer: Self-pay | Admitting: Radiation Oncology

## 2022-01-24 ENCOUNTER — Other Ambulatory Visit: Payer: Self-pay

## 2022-01-29 LAB — COLOGUARD: COLOGUARD: NEGATIVE

## 2022-01-31 ENCOUNTER — Other Ambulatory Visit: Payer: Self-pay

## 2022-02-02 ENCOUNTER — Telehealth: Payer: Self-pay | Admitting: *Deleted

## 2022-02-02 NOTE — Telephone Encounter (Signed)
CALLED PATIENT TO REMIND OF HDR [place] 02-05-22 @ 2 PM, SPOKE WITH PATIENT AND SHE IS AWARE OF THIS Brenda [name].

## 2022-02-04 NOTE — Progress Notes (Signed)
  Radiation Oncology         [phone]) [phone] ________________________________  Name: Brenda [name] MRN: [id number]  Date: 02/05/2022  DOB: Apr 16, 1961  CC: Charlott Rakes, MD  Charlott Rakes, MD  HDR BRACHYTHERAPY NOTE  DIAGNOSIS: The encounter diagnosis was Endometrial adenocarcinoma ([place]).   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 She will be treated with a 2.5 cm diameter segmented cylinder. This conforms to her anatomy without undue discomfort.  Vaginal brachytherapy procedure node: The patient was brought to the [facility] suite. Identity was confirmed. All relevant records and images related to the planned course of therapy were reviewed. The patient freely provided informed written consent to proceed with treatment after reviewing the details related to the planned course of therapy. The consent form was witnessed and verified by the simulation staff. Then, the patient was set-up in a stable reproducible supine position for radiation therapy. Pelvic exam revealed the vaginal cuff to be intact . The patient's custom vaginal cylinder was placed in the proximal vagina. This was affixed to the CT/MR stabilization plate to prevent slippage. Patient tolerated the placement well.  Verification simulation note:  A fiducial marker was placed within the vaginal cylinder. An AP and lateral film was then obtained through the pelvis area. This documented accurate position of the vaginal cylinder for treatment.  HDR BRACHYTHERAPY TREATMENT  The remote afterloading device was affixed to the vaginal cylinder by catheter. Patient then proceeded to undergo her second high-dose-rate treatment directed at the proximal vagina. The patient was prescribed a dose of 6.0 gray to be delivered to the mucosal surface. Treatment  length was 3.0 cm. Patient was treated with 1 channel using 7 dwell positions. Treatment time was 219.1 seconds. Iridium 192 was the high-dose-rate source for treatment. The patient tolerated the treatment well. After completion of her therapy, a radiation survey was performed documenting return of the iridium source into the GammaMed safe.   PLAN: The patient will return next week for her third high-dose-rate treatment.  ________________________________  -----------------------------------  Blair Promise, PhD, MD  This document serves as a record of services personally performed by Gery Pray, MD. It was created on his behalf by Roney Mans, a trained medical scribe. The creation of this record is based on the scribe's personal observations and the provider's statements to them. This document has been checked and approved by the attending provider.

## 2022-02-05 ENCOUNTER — Ambulatory Visit
Admission: RE | Admit: 2022-02-05 | Discharge: 2022-02-05 | Disposition: A | Discharge status: Home / Self Care | Payer: Medicaid Other | Source: Ambulatory Visit | Attending: Radiation Oncology | Admitting: Radiation Oncology

## 2022-02-05 ENCOUNTER — Other Ambulatory Visit: Payer: Self-pay

## 2022-02-05 ENCOUNTER — Other Ambulatory Visit: Payer: Self-pay | Admitting: Family Medicine

## 2022-02-05 DIAGNOSIS — C541 Malignant neoplasm of endometrium: Secondary | ICD-10-CM | POA: Diagnosis present

## 2022-02-05 DIAGNOSIS — I5032 Chronic diastolic (congestive) heart failure: Secondary | ICD-10-CM

## 2022-02-05 LAB — RAD ONC ARIA SESSION SUMMARY
Course Elapsed Days: 14
Plan Fractions Treated to Date: 2
Plan Prescribed Dose Per Fraction: 6 Gy
Plan Total Fractions Prescribed: 5
Plan Total Prescribed Dose: 30 Gy
Reference Point Dosage Given to Date: 12 Gy
Reference Point Session Dosage Given: 6 Gy
Session Number: 2

## 2022-02-06 ENCOUNTER — Other Ambulatory Visit: Payer: Self-pay

## 2022-02-09 DIAGNOSIS — Z8542 Personal history of malignant neoplasm of other parts of uterus: Secondary | ICD-10-CM | POA: Insufficient documentation

## 2022-02-11 ENCOUNTER — Telehealth: Payer: Self-pay | Admitting: Primary Care

## 2022-02-11 NOTE — Assessment & Plan Note (Signed)
-   Continue 2L oxygen 24/7 and blended into BIPAP  - Patient walked in office today and was able to maintain O2 >90% on portable oxygen concentrator at 2L

## 2022-02-11 NOTE — Assessment & Plan Note (Addendum)
-   Continue to wear BiPAP while sleeping aim for 4 to 6 hours of use or more - We have requested a compliance report from [employer] to review current pressure setting and evaluate if any changes are needed

## 2022-02-11 NOTE — Telephone Encounter (Signed)
I need copy of patient's CPAP and compliance report. DME company is [employer]

## 2022-02-12 ENCOUNTER — Other Ambulatory Visit: Payer: Self-pay

## 2022-02-13 ENCOUNTER — Other Ambulatory Visit: Payer: Self-pay

## 2022-02-13 NOTE — Telephone Encounter (Signed)
Checked [employer], [employer], and [employer] to see if pt was in there and pt was not in any of these. Have sent a community message to [employer] to see if we can have compliance report faxed to office. Will await a response.

## 2022-02-14 NOTE — Telephone Encounter (Signed)
Posting community message responses from [employer] below about trying to get a download from pt's machine:   ----- Message -----  From: Miquel Dunn  Sent: 02/13/2022   4:57 PM EDT  To: Darlina Guys; Miquel Dunn; Stephannie Peters; *  Subject: RE: Compliance report                           Hello,   Patient has a [place] pap unit. I will have to request from our [facility] team.  I will fax over a copy once received.   Thank you,   Osker Mason, Leory Plowman  Ellarie Picking, Waldemar Dickens, [place]; Hubert Azure, [facility],   I received the following reply regarding Down load for patient : Still no data in patient's account in [employer]. We called and spoke with patient, but she wasn't able to walked through with me in getting her code from the machine. She lives in [place], so she said she'll have her SD card read at the local branch   Thank you,   Brad New      Routing to [place] as FYI.

## 2022-02-15 NOTE — Telephone Encounter (Signed)
Thanks [employer]

## 2022-02-16 ENCOUNTER — Other Ambulatory Visit: Payer: Self-pay

## 2022-02-16 ENCOUNTER — Telehealth: Payer: Self-pay | Admitting: *Deleted

## 2022-02-16 MED ORDER — EMPAGLIFLOZIN 10 MG PO TABS
ORAL_TABLET | ORAL | 6 refills | Status: DC
  Filled 2022-02-16 – 2022-03-11 (×2): qty 30, 30d supply, fill #0
  Filled 2022-03-22: qty 30, fill #0
  Filled 2022-03-24 – 2022-04-13 (×2): qty 30, 30d supply, fill #0

## 2022-02-16 MED ORDER — OZEMPIC (1 MG/DOSE) 4 MG/3ML ~~LOC~~ SOPN
PEN_INJECTOR | SUBCUTANEOUS | 3 refills | Status: DC
  Filled 2022-02-16 – 2022-02-26 (×2): qty 3, 28d supply, fill #0

## 2022-02-16 NOTE — Telephone Encounter (Signed)
CALLED PATIENT TO REMIND OF HDR [place] 02-19-22 @ 2 PM, SPOKE WITH PATIENT AND SHE IS AWARE OF THIS [place].

## 2022-02-17 NOTE — Progress Notes (Signed)
  Radiation Oncology         [phone] ________________________________  Name: Brenda [name] MRN: [id number]  Date: 02/19/2022  DOB: Jun 01, 1961  CC: Charlott Rakes, MD  Charlott Rakes, MD  HDR BRACHYTHERAPY NOTE  DIAGNOSIS: The encounter diagnosis was Endometrial adenocarcinoma ([place]).   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 She will be treated with a 2.5 cm diameter segmented cylinder. This conforms to her anatomy without undue discomfort.  Vaginal brachytherapy procedure node: The patient was brought to the [place] suite. Identity was confirmed. All relevant records and images related to the planned course of therapy were reviewed. The patient freely provided informed written consent to proceed with treatment after reviewing the details related to the planned course of therapy. The consent form was witnessed and verified by the simulation staff. Then, the patient was set-up in a stable reproducible supine position for radiation therapy. Pelvic exam revealed the vaginal cuff to be intact . The patient's custom vaginal cylinder was placed in the proximal vagina. This was affixed to the CT/MR stabilization plate to prevent slippage. Patient tolerated the placement well.  Verification simulation note:  A fiducial marker was placed within the vaginal cylinder. An AP and lateral film was then obtained through the pelvis area. This documented accurate position of the vaginal cylinder for treatment.  HDR BRACHYTHERAPY TREATMENT  The remote afterloading device was affixed to the vaginal cylinder by catheter. Patient then proceeded to undergo her third high-dose-rate treatment directed at the proximal vagina. The patient was prescribed a dose of 6.0 gray to be delivered to the mucosal surface. Treatment  length was 3.0 cm. Patient was treated with 1 channel using 7 dwell positions. Treatment time was 249.8 seconds. Iridium 192 was the high-dose-rate source for treatment. The patient tolerated the treatment well. After completion of her therapy, a radiation survey was performed documenting return of the iridium source into the GammaMed safe.   PLAN: The patient will return next week for her fourth high-dose-rate treatment.  ________________________________  -----------------------------------  Blair Promise, PhD, MD  This document serves as a record of services personally performed by Gery Pray, MD. It was created on his behalf by Roney Mans, a trained medical scribe. The creation of this record is based on the scribe's personal observations and the provider's statements to them. This document has been checked and approved by the attending provider.

## 2022-02-19 ENCOUNTER — Other Ambulatory Visit: Payer: Self-pay

## 2022-02-19 ENCOUNTER — Ambulatory Visit
Admission: RE | Admit: 2022-02-19 | Discharge: 2022-02-19 | Disposition: A | Discharge status: Home / Self Care | Payer: Medicaid Other | Source: Ambulatory Visit | Attending: Radiation Oncology | Admitting: Radiation Oncology

## 2022-02-19 DIAGNOSIS — C541 Malignant neoplasm of endometrium: Secondary | ICD-10-CM

## 2022-02-19 LAB — RAD ONC ARIA SESSION SUMMARY
Course Elapsed Days: 28
Plan Fractions Treated to Date: 3
Plan Prescribed Dose Per Fraction: 6 Gy
Plan Total Fractions Prescribed: 5
Plan Total Prescribed Dose: 30 Gy
Reference Point Dosage Given to Date: 18 Gy
Reference Point Session Dosage Given: 6 Gy
Session Number: 3

## 2022-02-22 ENCOUNTER — Ambulatory Visit: Payer: Medicaid Other | Admitting: Radiation Oncology

## 2022-02-23 ENCOUNTER — Telehealth: Payer: Self-pay | Admitting: *Deleted

## 2022-02-26 ENCOUNTER — Other Ambulatory Visit: Payer: Self-pay

## 2022-02-26 ENCOUNTER — Ambulatory Visit
Admission: RE | Admit: 2022-02-26 | Discharge: 2022-02-26 | Disposition: A | Discharge status: Home / Self Care | Payer: Medicaid Other | Source: Ambulatory Visit | Attending: Radiation Oncology | Admitting: Radiation Oncology

## 2022-02-26 DIAGNOSIS — C541 Malignant neoplasm of endometrium: Secondary | ICD-10-CM

## 2022-02-26 LAB — RAD ONC ARIA SESSION SUMMARY
Course Elapsed Days: 35
Plan Fractions Treated to Date: 4
Plan Prescribed Dose Per Fraction: 6 Gy
Plan Total Fractions Prescribed: 5
Plan Total Prescribed Dose: 30 Gy
Reference Point Dosage Given to Date: 24 Gy
Reference Point Session Dosage Given: 6 Gy
Session Number: 4

## 2022-02-28 ENCOUNTER — Ambulatory Visit: Payer: Medicaid Other | Admitting: Family Medicine

## 2022-03-02 DIAGNOSIS — R8290 Unspecified abnormal findings in urine: Secondary | ICD-10-CM | POA: Insufficient documentation

## 2022-03-05 ENCOUNTER — Telehealth: Payer: Self-pay | Admitting: *Deleted

## 2022-03-05 NOTE — Progress Notes (Signed)
  Radiation Oncology         [phone] ________________________________  Name: Brenda [name] MRN: [id number]  Date: 03/07/2022  DOB: 05-25-1961  CC: Charlott Rakes, MD  Charlott Rakes, MD  HDR BRACHYTHERAPY NOTE  DIAGNOSIS: The encounter diagnosis was Endometrial adenocarcinoma ([place]).   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 She will be treated with a 2.5 cm diameter segmented cylinder. This conforms to her anatomy without undue discomfort.  Vaginal brachytherapy procedure node: The patient was brought to the [facility] suite. Identity was confirmed. All relevant records and images related to the planned course of therapy were reviewed. The patient freely provided informed written consent to proceed with treatment after reviewing the details related to the planned course of therapy. The consent form was witnessed and verified by the simulation staff. Then, the patient was set-up in a stable reproducible supine position for radiation therapy. Pelvic exam revealed the vaginal cuff to be intact . The patient's custom vaginal cylinder was placed in the proximal vagina. This was affixed to the CT/MR stabilization plate to prevent slippage. Patient tolerated the placement well.  Verification simulation note:  A fiducial marker was placed within the vaginal cylinder. An AP and lateral film was then obtained through the pelvis area. This documented accurate position of the vaginal cylinder for treatment.  HDR BRACHYTHERAPY TREATMENT  The remote afterloading device was affixed to the vaginal cylinder by catheter. Patient then proceeded to undergo her fifth high-dose-rate treatment directed at the proximal vagina. The patient was prescribed a dose of 6.0 gray to be delivered to the mucosal surface. Treatment  length was 3.0 cm. Patient was treated with 1 channel using 7 dwell positions. Treatment time was 290.4 seconds. Iridium 192 was the high-dose-rate source for treatment. The patient tolerated the treatment well. After completion of her therapy, a radiation survey was performed documenting return of the iridium source into the GammaMed safe.   PLAN: The patient has completed her fifth and final high-dose-rate treatment. She will return in one month for routine follow-up.  Overall she tolerated her vaginal brachytherapy well without any significant side effects. ________________________________  -----------------------------------  Blair Promise, PhD, MD  This document serves as a record of services personally performed by Gery Pray, MD. It was created on his behalf by Roney Mans, a trained medical scribe. The creation of this record is based on the scribe's personal observations and the provider's statements to them. This document has been checked and approved by the attending provider.

## 2022-03-05 NOTE — Telephone Encounter (Signed)
CALLED PATIENT TO REMIND HER OF HER HDR TX. ON 03-07-22 @ 10 AM, LVM FOR A RETURN CALL

## 2022-03-07 ENCOUNTER — Encounter: Payer: Self-pay | Admitting: Radiation Oncology

## 2022-03-07 ENCOUNTER — Other Ambulatory Visit: Payer: Self-pay

## 2022-03-07 ENCOUNTER — Ambulatory Visit
Admission: RE | Admit: 2022-03-07 | Discharge: 2022-03-07 | Disposition: A | Discharge status: Home / Self Care | Payer: Medicaid Other | Source: Ambulatory Visit | Attending: Radiation Oncology | Admitting: Radiation Oncology

## 2022-03-07 DIAGNOSIS — C541 Malignant neoplasm of endometrium: Secondary | ICD-10-CM | POA: Insufficient documentation

## 2022-03-07 LAB — RAD ONC ARIA SESSION SUMMARY
Course Elapsed Days: 44
Plan Fractions Treated to Date: 5
Plan Prescribed Dose Per Fraction: 6 Gy
Plan Total Fractions Prescribed: 5
Plan Total Prescribed Dose: 30 Gy
Reference Point Dosage Given to Date: 30 Gy
Reference Point Session Dosage Given: 6 Gy
Session Number: 5

## 2022-03-07 MED ORDER — SULFAMETHOXAZOLE-TRIMETHOPRIM 800-160 MG PO TABS
ORAL_TABLET | ORAL | 0 refills | Status: DC
  Filled 2022-03-07 (×2): qty 14, 7d supply, fill #0

## 2022-03-08 ENCOUNTER — Other Ambulatory Visit: Payer: Self-pay

## 2022-03-08 MED ORDER — FUROSEMIDE 40 MG PO TABS
ORAL_TABLET | ORAL | 1 refills | Status: DC
  Filled 2022-03-08: qty 90, 90d supply, fill #0

## 2022-03-09 ENCOUNTER — Other Ambulatory Visit: Payer: Self-pay

## 2022-03-12 ENCOUNTER — Other Ambulatory Visit: Payer: Self-pay | Admitting: Pharmacist

## 2022-03-12 ENCOUNTER — Other Ambulatory Visit: Payer: Self-pay

## 2022-03-12 MED ORDER — OZEMPIC (1 MG/DOSE) 4 MG/3ML ~~LOC~~ SOPN
PEN_INJECTOR | SUBCUTANEOUS | 3 refills | Status: DC
  Filled 2022-03-12: qty 3, fill #0
  Filled 2022-03-22: qty 3, 28d supply, fill #0
  Filled 2022-04-18: qty 3, 28d supply, fill #1

## 2022-03-13 ENCOUNTER — Other Ambulatory Visit: Payer: Self-pay

## 2022-03-20 ENCOUNTER — Other Ambulatory Visit: Payer: Self-pay

## 2022-03-22 ENCOUNTER — Other Ambulatory Visit: Payer: Self-pay

## 2022-03-26 ENCOUNTER — Other Ambulatory Visit: Payer: Self-pay

## 2022-04-04 ENCOUNTER — Encounter: Payer: Self-pay | Admitting: Radiation Oncology

## 2022-04-08 NOTE — Progress Notes (Incomplete)
  Radiation Oncology         [phone] ________________________________  Patient Name: Brenda [name] MRN: [id number] DOB: 08-Oct-1960 Referring Physician: Arnoldo Morale (Profile Not Attached) Date of Service: 03/07/2022 [facility], [place]                                                        End Of Treatment Note  Diagnoses: C54.1-Malignant neoplasm of endometrium  Cancer Staging: The encounter diagnosis was Endometrial adenocarcinoma ([place]).   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  Plan: The patient will follow-up with radiation oncology in one month .  ________________________________________________ -----------------------------------  Blair Promise, PhD, MD  This document serves as a record of services personally performed by Gery Pray, MD. It was created on his behalf by Roney Mans, a trained medical scribe. The creation of this record is based on the scribe's personal observations and the provider's statements to them. This document has been checked and approved by the attending provider.

## 2022-04-08 NOTE — Progress Notes (Signed)
Radiation Oncology         [phone]) [phone] ________________________________  Name: Brenda [name] MRN: [id number]  Date: 04/09/2022  DOB: 01-05-1961  Follow-Up Visit Note  CC: Brenda Rakes, MD  Brenda Rakes, MD    ICD-10-CM   1. Endometrial adenocarcinoma (HCC)  C54.1       Diagnosis: The encounter diagnosis was Endometrial adenocarcinoma ([place]).   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 The patient tolerated vaginal brachytherapy well without any significant side effects.  No significant interval history since the patient was last seen for follow-up prior to initiating brachytherapy.   She did recently develop a bladder infection and was treated with Bactrim DS.  She subsequently developed a vaginal yeast infection which was treated with over-the-counter medication which cleared this issue.          Allergies:  is allergic to hydrocodone and clopidogrel bisulfate.  Meds: Current Outpatient Medications  Medication Sig Dispense Refill   atorvastatin (LIPITOR) 80 MG tablet Take 1 tablet (80 mg total) by mouth every evening. 30 tablet 6   Blood Glucose Monitoring Suppl (TRUE METRIX METER) DEVI 1 each by Does not apply route 3 (three) times daily. 1 Device 0   empagliflozin (JARDIANCE) 10 MG TABS tablet Take 1 tablet (10 mg total) by mouth daily. 30 tablet 6   empagliflozin (JARDIANCE) 10 MG TABS tablet Take 1 tablet by mouth daily . 30 tablet 6   FLUoxetine (PROZAC) 20 MG capsule Take 1 capsule (20 mg total) by mouth daily. 30 capsule 6   furosemide (LASIX) 40 MG  tablet Take 1 tablet by mouth once daily 30 tablet 0   furosemide (LASIX) 40 MG tablet Take 1 tablet by mouth daily for 90 days. 90 tablet 1   glucose blood (TRUE METRIX BLOOD GLUCOSE TEST) test strip Use as instructed 100 each 5   insulin aspart (NOVOLOG) 100 UNIT/ML injection Inject 10-20 Units into the skin in the morning, at noon, and at bedtime. If BS<150=Take 10 units or If BS>150=Take 20 units     insulin detemir (LEVEMIR FLEXPEN) 100 UNIT/ML FlexPen Inject 25 Units into the skin 2 (two) times daily.     Insulin Pen Needle (NOVOFINE) 30G X 8 MM MISC Use as directed with Novolin 70/30 insulin 100 each 1   Misc. Devices MISC Portable oxygen concentrator, 2L oxygen.  Diagnoses-chronic respiratory failure with hypoxia 1 each 0   Multiple Minerals (CALCIUM-MAGNESIUM-ZINC) TABS Take 1 tablet by mouth daily.     nitroGLYCERIN (NITROSTAT) 0.4 MG SL tablet Place 1 tablet (0.4 mg total) under the tongue every 5 (five) minutes as needed for chest pain (up to 3 doses. If taking 3rd dose call 911). 25 tablet 3   prasugrel (EFFIENT) 5 MG TABS tablet Take 1 tablet (5 mg total) by mouth daily. 30 tablet 6   sacubitril-valsartan (ENTRESTO) 24-26 MG Take 1 tablet by mouth 2 (two) times daily. 180 tablet 3   Semaglutide, 1 MG/DOSE, (OZEMPIC, 1 MG/DOSE,) 4 MG/3ML SOPN Inject 18m every week subcutaneously 3 mL 3   TRUEPLUS LANCETS 30G MISC 1 each by Does not  apply route 3 (three) times daily. 100 each 5   aspirin 81 MG EC tablet Take 1 tablet (81 mg total) by mouth daily. (Patient not taking: Reported on 04/09/2022) 30 tablet 11   gabapentin (NEURONTIN) 300 MG capsule Take 2 capsules (600 mg total) by mouth at bedtime. 60 capsule 6   No current facility-administered medications for this encounter.    Physical Findings: The patient is in no acute distress. Patient is alert and oriented.  height is '5\' 2"'$  (1.575 m). Her temperature is 97.8 F (36.6 C). Her blood pressure is 136/61 and her pulse is 70. Her  respiration is 20 and oxygen saturation is 100%. .  No significant changes. Lungs are clear to auscultation bilaterally. Heart has regular rate and rhythm. No palpable cervical, supraclavicular, or axillary adenopathy. Abdomen soft, non-tender, normal bowel sounds.  Pelvic exam deferred in light of recent completion of treatment.   Lab Findings: Lab Results  Component Value Date   WBC 16.9 (H) 12/11/2021   HGB 10.3 (L) 12/11/2021   HCT 34.1 (L) 12/11/2021   MCV 86.1 12/11/2021   PLT 336 12/11/2021    Radiographic Findings: No results found.  Impression:  The encounter diagnosis was Endometrial adenocarcinoma ([place]).   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  Plan: She will follow-up with Dr. Berline Lopes in 2 months.  Routine follow-up in radiation oncology in 5 months.  Today the patient was given a vaginal dilator and instructions on its use.   15 minutes of total time was spent for this patient encounter, including preparation, face-to-face counseling with the patient and coordination of care, physical exam, and documentation of the encounter. ____________________________________  Blair Promise, PhD, MD  This document serves as a record of services personally performed by Gery Pray, MD. It was created on his behalf by Roney Mans, a trained medical scribe. The creation of this record is based on the scribe's personal observations and the provider's statements to them. This document has been checked and approved by the attending provider.

## 2022-04-09 ENCOUNTER — Ambulatory Visit
Admission: RE | Admit: 2022-04-09 | Discharge: 2022-04-09 | Disposition: A | Discharge status: Home / Self Care | Payer: Medicaid Other | Source: Ambulatory Visit | Attending: Radiation Oncology | Admitting: Radiation Oncology

## 2022-04-09 ENCOUNTER — Encounter: Payer: Self-pay | Admitting: Radiation Oncology

## 2022-04-09 DIAGNOSIS — Z923 Personal history of irradiation: Secondary | ICD-10-CM | POA: Diagnosis not present

## 2022-04-09 DIAGNOSIS — Z90722 Acquired absence of ovaries, bilateral: Secondary | ICD-10-CM | POA: Insufficient documentation

## 2022-04-09 DIAGNOSIS — Z90710 Acquired absence of both cervix and uterus: Secondary | ICD-10-CM | POA: Insufficient documentation

## 2022-04-09 DIAGNOSIS — Z79899 Other long term (current) drug therapy: Secondary | ICD-10-CM | POA: Insufficient documentation

## 2022-04-09 DIAGNOSIS — Z7984 Long term (current) use of oral hypoglycemic drugs: Secondary | ICD-10-CM | POA: Insufficient documentation

## 2022-04-09 DIAGNOSIS — C541 Malignant neoplasm of endometrium: Secondary | ICD-10-CM | POA: Diagnosis present

## 2022-04-09 DIAGNOSIS — Z7902 Long term (current) use of antithrombotics/antiplatelets: Secondary | ICD-10-CM | POA: Insufficient documentation

## 2022-04-09 DIAGNOSIS — Z7982 Long term (current) use of aspirin: Secondary | ICD-10-CM | POA: Insufficient documentation

## 2022-04-09 HISTORY — DX: Personal history of irradiation: Z92.3

## 2022-04-09 NOTE — Progress Notes (Signed)
Brenda [name] is here today for follow up post radiation to the pelvic.  They completed their radiation on: 03/07/22  Does the patient complain of any of the following:  Pain: No Abdominal bloating: No Diarrhea/Constipation: No Nausea/Vomiting: No Vaginal Discharge: No Blood in Urine or Stool: No Urinary Issues (dysuria/incomplete emptying/ incontinence/ increased frequency/urgency): Patient reports having a UTI 2 weeks ago. Patient completed a 10 course of Batrium DS. Patient also had yeast infection.  Does patient report using vaginal dilator 2-3 times a week and/or sexually active 2-3 weeks: Patient given xs+ and s vaginal dilators with instructions and importance of use. Patient voiced understanding. Post radiation skin changes: No   Additional comments if applicable:  BP 500/93 (BP Location: Right Wrist, Patient Position: Sitting, Cuff Size: Normal)   Pulse 70   Temp 97.8 F (36.6 C)   Resp 20   Ht '5\' 2"'$  (1.575 m)   LMP 10/30/2014 (Approximate)   SpO2 100%   PF (!) 2 L/min   BMI 51.58 kg/m

## 2022-04-13 ENCOUNTER — Other Ambulatory Visit: Payer: Self-pay

## 2022-04-16 ENCOUNTER — Other Ambulatory Visit: Payer: Self-pay

## 2022-04-17 ENCOUNTER — Other Ambulatory Visit: Payer: Self-pay

## 2022-04-17 ENCOUNTER — Telehealth: Payer: Self-pay

## 2022-04-17 NOTE — Telephone Encounter (Signed)
Enid Derry from (RAD ONC) called office.  Pt is scheduled for a follow up on 06/26/22  at 3:30 with Dr. Berline Lopes.  Enid Derry to notify pt of appointment date and time.

## 2022-04-18 ENCOUNTER — Other Ambulatory Visit: Payer: Self-pay

## 2022-04-23 ENCOUNTER — Ambulatory Visit (INDEPENDENT_AMBULATORY_CARE_PROVIDER_SITE_OTHER): Payer: Medicaid Other | Admitting: Pulmonary Disease

## 2022-04-23 ENCOUNTER — Encounter: Payer: Self-pay | Admitting: Pulmonary Disease

## 2022-04-23 VITALS — BP 120/70 | HR 92 | Ht 62.0 in | Wt 280.6 lb

## 2022-04-23 DIAGNOSIS — E662 Morbid (severe) obesity with alveolar hypoventilation: Secondary | ICD-10-CM | POA: Diagnosis not present

## 2022-04-23 DIAGNOSIS — J9611 Chronic respiratory failure with hypoxia: Secondary | ICD-10-CM | POA: Diagnosis not present

## 2022-04-23 DIAGNOSIS — J9612 Chronic respiratory failure with hypercapnia: Secondary | ICD-10-CM

## 2022-04-23 DIAGNOSIS — G4733 Obstructive sleep apnea (adult) (pediatric): Secondary | ICD-10-CM

## 2022-04-23 NOTE — Patient Instructions (Signed)
Will arrange for in lab sleep study and call with results  Follow up in 6 months

## 2022-04-23 NOTE — Progress Notes (Signed)
[facility]  Chief Complaint  Patient presents with   Follow-up    Follow-up    Past Surgical History:  She  has a past surgical history that includes left heart catheterization with coronary angiogram (N/A, 12/25/2012); Cholecystectomy open (09/04/1987); Umbilical hernia repair (05/04/1989); Coronary angioplasty with stent (09/03/2009); Cardiac catheterization (12/02/2012); Coronary/Graft Acute MI Revascularization (N/A, 10/26/2019); LEFT HEART CATH AND CORONARY ANGIOGRAPHY (N/A, 10/26/2019); Robotic assisted total hysterectomy with bilateral salpingo oophorectomy (N/A, 12/06/2021); and Incisional hernia repair (N/A, 12/09/2021).  Past Medical History:  Anemia, OA, CAD, Uterine cancer, CHF, DM type 2, Depression, HLD, HTN, Neuropathy  Constitutional:  BP 120/70 (BP Location: Left Arm)   Pulse 92   Ht '5\' 2"'$  (1.575 m)   Wt 280 lb 9.6 oz (127.3 kg)   LMP 10/30/2014 (Approximate)   SpO2 97% Comment: On oxygen  BMI 51.32 kg/m   Brief Summary:  Brenda [name] is a 61 y.o. female former smoker with obstructive sleep apnea and chronic respiratory failure.      Subjective:   She is here with her husband.  She had her sleep study years ago - not available for review.  She got a new PAP device last year.  She was told that her insurance would stop paying for her device, but she wasn't given an explanation as to why.  She uses CPAP nightly.  She has lost 70 lbs over the past year.  She still needs oxygen at night and sleep, but doesn't need when sitting during the day.  Physical Exam:   Appearance - well kempt, wearing oxygen  ENMT - no sinus tenderness, no oral exudate, no LAN, Mallampati 3 airway, no stridor  Respiratory - equal breath sounds bilaterally, no wheezing or rales  CV - s1s2 regular rate and rhythm, no murmurs  Ext - no clubbing, no edema  Skin - no rashes  Psych - normal mood and affect   Pulmonary testing:   ABG 02/12/21 >> pH 7.37, PCO2 62.5, PO2 52  Chest Imaging:  CT angio chest 10/26/19 >> clear lungs  Sleep Tests:  Auto CPAP 11/22/21 to 02/19/22 >> used on 86 of 90 nights with average 9 hrs 47 min.  Average AHI 0.2 with mean CPAP 6 and 95 th percentile CPAP 8 cm H2O  Cardiac Tests:  Echo 02/08/21 >> EF 60 to 65%, mod RV reduction  Social History:  She  reports that she quit smoking about 35 years ago. Her smoking use included cigarettes and cigars. She has a 0.50 pack-year smoking history. She has never used smokeless tobacco. She reports that she does not drink alcohol and does not use drugs.  Family History:  Her family history includes COPD in her father; Cancer in her mother; Coronary artery disease in her mother and sister; Diabetes in her sister; Heart attack in her father; Hypertension in her mother; Other in her half-sister; Stroke in her sister.     Assessment/Plan:   Sleep disordered breathing. - from obstructive sleep apnea and obesity hypoventilation syndrome - she is compliant with CPAP and reports benefit from therapy - she likely needs to have updated sleep study for insurance to continue coverage of therapy; she needs to have sleep study done in the sleep lab since she might need supplemental oxygen during the test - she uses [employer] for her DME - current CPAP ordered June 2022 - continue auto CPAP 5 to 15 cm H2O  Chronic respiratory failure with hypoxia and  hypercapnia. - continue 2 liters with exertion and with CPAP at night  Obesity. - encouraged her to keep up with weight loss efforts  Time Spent Involved in Patient Care on Day of Examination:  37 minutes  Follow up:   Patient Instructions  Will arrange for in lab sleep study and call with results  Follow up in 6 months  Medication List:   Allergies as of 04/23/2022       Reactions   Hydrocodone Shortness Of Breath   "I forget to breathe."    Clopidogrel Rash   Clopidogrel Bisulfate Rash         Medication List        Accurate as of April 23, 2022  2:53 PM. If you have any questions, ask your nurse or doctor.          STOP taking these medications    gabapentin 300 MG capsule Commonly known as: NEURONTIN Stopped by: Chesley Mires, MD       TAKE these medications    Aspirin Low Dose 81 MG tablet Generic drug: aspirin EC Take 1 tablet (81 mg total) by mouth daily.   atorvastatin 80 MG tablet Commonly known as: LIPITOR Take 1 tablet (80 mg total) by mouth every evening.   Calcium-Magnesium-Zinc Tabs Take 1 tablet by mouth daily.   Entresto 24-26 MG Generic drug: sacubitril-valsartan Take 1 tablet by mouth 2 (two) times daily.   FLUoxetine 20 MG capsule Commonly known as: PROzac Take 1 capsule (20 mg total) by mouth daily.   furosemide 40 MG tablet Commonly known as: LASIX Take 1 tablet by mouth once daily What changed: Another medication with the same name was removed. Continue taking this medication, and follow the directions you see here. Changed by: Chesley Mires, MD   glucose blood test strip Commonly known as: True Metrix Blood Glucose Test Use as instructed   insulin aspart 100 UNIT/ML injection Commonly known as: novoLOG Inject 10-20 Units into the skin in the morning, at noon, and at bedtime. If BS<150=Take 10 units or If BS>150=Take 20 units   Insulin Pen Needle 30G X 8 MM Misc Commonly known as: NOVOFINE Use as directed with Novolin 70/30 insulin   Jardiance 10 MG Tabs tablet Generic drug: empagliflozin Take 1 tablet by mouth daily . What changed: Another medication with the same name was removed. Continue taking this medication, and follow the directions you see here. Changed by: Chesley Mires, MD   Levemir FlexPen 100 UNIT/ML FlexPen Generic drug: insulin detemir Inject 25 Units into the skin 2 (two) times daily.   Misc. Devices Misc Portable oxygen concentrator, 2L oxygen.  Diagnoses-chronic respiratory failure with hypoxia    nitroGLYCERIN 0.4 MG SL tablet Commonly known as: NITROSTAT Place 1 tablet (0.4 mg total) under the tongue every 5 (five) minutes as needed for chest pain (up to 3 doses. If taking 3rd dose call 911).   Ozempic (1 MG/DOSE) 4 MG/3ML Sopn Generic drug: Semaglutide (1 MG/DOSE) Inject '1mg'$  every week subcutaneously   prasugrel 5 MG Tabs tablet Commonly known as: EFFIENT Take 1 tablet (5 mg total) by mouth daily.   True Metrix Meter Devi 1 each by Does not apply route 3 (three) times daily.   TRUEplus Lancets 30G Misc 1 each by Does not apply route 3 (three) times daily.        Signature:  Chesley Mires, MD [place] Pager - [phone] 04/23/2022, 2:53 PM

## 2022-04-25 ENCOUNTER — Other Ambulatory Visit: Payer: Self-pay

## 2022-05-16 DIAGNOSIS — Z951 Presence of aortocoronary bypass graft: Secondary | ICD-10-CM | POA: Insufficient documentation

## 2022-05-17 DIAGNOSIS — E282 Polycystic ovarian syndrome: Secondary | ICD-10-CM | POA: Insufficient documentation

## 2022-05-21 ENCOUNTER — Other Ambulatory Visit: Payer: Self-pay

## 2022-05-21 ENCOUNTER — Telehealth: Payer: Self-pay | Admitting: Pulmonary Disease

## 2022-05-21 MED ORDER — PRASUGREL HCL 5 MG PO TABS: 5.0000 mg | ORAL_TABLET | Freq: Every day | ORAL | 2 refills | Status: DC

## 2022-05-21 NOTE — Telephone Encounter (Signed)
Patient states that the sleep center told her they did not have her scheduled to be on oxygen to start her sleep study on Wednesday. Patient states she is on oxygen 24/7, and wanted to make sure this was correct. Please advise and call patient at [phone].

## 2022-05-23 ENCOUNTER — Ambulatory Visit (HOSPITAL_BASED_OUTPATIENT_CLINIC_OR_DEPARTMENT_OTHER): Payer: Medicaid Other | Admitting: Pulmonary Disease

## 2022-05-23 NOTE — Telephone Encounter (Signed)
With the sleep study that is scheduled for Wednesday are you wanting patient to be on or off oxygen sir  Patient states she wears oxygen 24/7?  Please advise

## 2022-05-24 NOTE — Telephone Encounter (Signed)
Called patient but she did not answer. Left message for her to call back.  

## 2022-05-24 NOTE — Telephone Encounter (Signed)
This is correct.  I want to have the study started on just CPAP without supplemental oxygen.  The sleep lab has a protocol they follow to determine whether she needs supplemental oxygen and will follow this.  She will have supplemental oxygen available in the sleep lab and it will be added in as needed.

## 2022-05-27 ENCOUNTER — Ambulatory Visit (HOSPITAL_BASED_OUTPATIENT_CLINIC_OR_DEPARTMENT_OTHER): Payer: Medicaid Other | Attending: Pulmonary Disease | Admitting: Pulmonary Disease

## 2022-05-27 DIAGNOSIS — Z6841 Body Mass Index (BMI) 40.0 and over, adult: Secondary | ICD-10-CM | POA: Insufficient documentation

## 2022-05-27 DIAGNOSIS — E669 Obesity, unspecified: Secondary | ICD-10-CM | POA: Diagnosis not present

## 2022-05-27 DIAGNOSIS — G4733 Obstructive sleep apnea (adult) (pediatric): Secondary | ICD-10-CM | POA: Diagnosis present

## 2022-05-27 DIAGNOSIS — J9612 Chronic respiratory failure with hypercapnia: Secondary | ICD-10-CM | POA: Insufficient documentation

## 2022-05-27 DIAGNOSIS — J9611 Chronic respiratory failure with hypoxia: Secondary | ICD-10-CM | POA: Diagnosis not present

## 2022-05-27 DIAGNOSIS — E662 Morbid (severe) obesity with alveolar hypoventilation: Secondary | ICD-10-CM

## 2022-06-01 ENCOUNTER — Telehealth: Payer: Self-pay | Admitting: Pulmonary Disease

## 2022-06-01 DIAGNOSIS — R0683 Snoring: Secondary | ICD-10-CM

## 2022-06-01 NOTE — Telephone Encounter (Signed)
Atc patient  lmtcb

## 2022-06-01 NOTE — Procedures (Signed)
    Patient Name: Brenda [name], Brenda [name] Date: 05/27/2022 Gender: Female D.O.B: [birth year] Age (years): 3 Referring Provider: Chesley Mires MD, ABSM Height (inches): 62 Interpreting Physician: Chesley Mires MD, ABSM Weight (lbs): 282 RPSGT: [place], [place] BMI: 52 MRN: [id number] Neck Size: 18.00  CLINICAL INFORMATION Sleep Study Type: NPSG  Indication for sleep study: Diabetes, Fatigue, Obesity, Snoring  Epworth Sleepiness Score: 3  SLEEP STUDY TECHNIQUE As per the AASM Manual for the Scoring of Sleep and Associated Events v2.3 (April 2016) with a hypopnea requiring 4% desaturations.  The channels recorded and monitored were frontal, central and occipital EEG, electrooculogram (EOG), submentalis EMG (chin), nasal and oral airflow, thoracic and abdominal wall motion, anterior tibialis EMG, snore microphone, electrocardiogram, and pulse oximetry.  MEDICATIONS Medications self-administered by patient taken the night of the study : N/A  SLEEP ARCHITECTURE The study was initiated at 11:15:16 PM and ended at 2:20:53 AM.  Sleep onset time was 52.1 minutes and the sleep efficiency was 4.8%%. The total sleep time was 9 minutes.  Stage REM latency was N/A minutes.  The patient spent 61.1%% of the night in stage N1 sleep, 38.9%% in stage N2 sleep, 0.0%% in stage N3 and 0% in REM.  Alpha intrusion was absent.  Supine sleep was 0.00%.  RESPIRATORY PARAMETERS The overall apnea/hypopnea index (AHI) was 0.0 per hour. There were 0 total apneas, including 0 obstructive, 0 central and 0 mixed apneas. There were 0 hypopneas and 1 RERAs.  The AHI during Stage REM sleep was N/A per hour.  AHI while supine was N/A per hour.  The mean oxygen saturation was 90.7%. The minimum SpO2 during sleep was 89.0%.  snoring was noted during this study.  CARDIAC DATA The 2 lead EKG demonstrated sinus rhythm. The mean heart rate was 70.9 beats per minute. Other EKG findings include: None.  LEG  MOVEMENT DATA The total PLMS were 0 with a resulting PLMS index of 0.0. Associated arousal with leg movement index was 0.0 .  IMPRESSIONS - There was insufficient sleep time during this study to make any assessment regarding sleep disordered breathing.  She had a total of 9 minutes sleep.  DIAGNOSIS - Insufficient sleep.  RECOMMENDATIONS - She could have in lab sleep study rescheduled with the use of a sleep aide medication to be used on the night of the study, or she could have a home sleep study scheduled. - Avoid alcohol, sedatives and other CNS depressants that may worsen sleep apnea and disrupt normal sleep architecture. - Sleep hygiene should be reviewed to assess factors that may improve sleep quality. - Weight management and regular exercise should be initiated or continued if appropriate.  [Electronically signed] 06/01/2022 08:47 AM  Chesley Mires MD, ABSM Diplomate, American Board of Sleep Medicine NPI: [id number]  [place] PH: [phone]   FX: [phone] [place]

## 2022-06-01 NOTE — Telephone Encounter (Signed)
Please let her know she didn't have enough sleep during her recent sleep study to make any determination regarding her sleep apnea and respiratory failure.  She can either reschedule the in lab sleep study and I can prescribe a medication to help her sleep on the night of the study, or we can schedule a diagnostic home sleep study but then she would likely need to have an in lab titration study schedule afterward to determine the best therapy set up.  Please let me know which option she selects.

## 2022-06-07 DIAGNOSIS — I255 Ischemic cardiomyopathy: Secondary | ICD-10-CM | POA: Insufficient documentation

## 2022-06-11 NOTE — Telephone Encounter (Signed)
ATC patient.  LMTCB. 

## 2022-06-12 NOTE — Telephone Encounter (Signed)
Called and spoke to patient. She states she had a really difficult time trying to fall asleep the night of the study. She would like to retry sleep study in lab and is okay to have something called in to [facility] on [address] to help her sleep the night of the study.

## 2022-06-15 ENCOUNTER — Other Ambulatory Visit: Payer: Self-pay

## 2022-06-15 MED ORDER — ESZOPICLONE 1 MG PO TABS
1.0000 mg | ORAL_TABLET | Freq: Every evening | ORAL | 0 refills | Status: DC | PRN
  Filled 2022-06-15: qty 15, 15d supply, fill #0

## 2022-06-15 NOTE — Telephone Encounter (Signed)
Please let her know I have send a script for lunesta (eszopiclone) 1 mg that she can use on the night of the repeat sleep study, and I have put in an order for repeat sleep study.

## 2022-06-15 NOTE — Telephone Encounter (Signed)
Called and notified patient and she voiced understanding. Nothing further needed at this time.

## 2022-06-18 ENCOUNTER — Other Ambulatory Visit: Payer: Self-pay

## 2022-06-21 ENCOUNTER — Other Ambulatory Visit: Payer: Self-pay

## 2022-06-25 ENCOUNTER — Encounter: Payer: Self-pay | Admitting: Gynecologic Oncology

## 2022-06-26 ENCOUNTER — Other Ambulatory Visit: Payer: Self-pay

## 2022-06-26 ENCOUNTER — Inpatient Hospital Stay: Payer: Medicaid Other | Attending: Gynecologic Oncology | Admitting: Gynecologic Oncology

## 2022-06-26 VITALS — BP 157/61 | HR 72 | Temp 98.1°F | Resp 17 | Ht 62.0 in | Wt 270.1 lb

## 2022-06-26 DIAGNOSIS — Z90722 Acquired absence of ovaries, bilateral: Secondary | ICD-10-CM | POA: Insufficient documentation

## 2022-06-26 DIAGNOSIS — Z8542 Personal history of malignant neoplasm of other parts of uterus: Secondary | ICD-10-CM | POA: Diagnosis not present

## 2022-06-26 DIAGNOSIS — Z90710 Acquired absence of both cervix and uterus: Secondary | ICD-10-CM | POA: Insufficient documentation

## 2022-06-26 DIAGNOSIS — Z923 Personal history of irradiation: Secondary | ICD-10-CM | POA: Diagnosis not present

## 2022-06-26 DIAGNOSIS — C541 Malignant neoplasm of endometrium: Secondary | ICD-10-CM

## 2022-06-26 NOTE — Patient Instructions (Addendum)
It was good to see you today.  I do not see or feel any evidence of cancer recurrence on your exam.  We will continue with visits every 3 months, alternating between my clinic and Dr. Sondra Come. I will see you back in April.  If you develop any of the symptoms that we discussed today between visits, please call to see me sooner.

## 2022-06-26 NOTE — Progress Notes (Signed)
Gynecologic Oncology Return Clinic Visit  06/26/2022  Reason for Visit: Follow-up visit in the setting of endometrial cancer  Treatment History: Oncology History Overview Note  MMR IHC: loss of MLH1 and PMS2 MSI - high MLH1 hyper methylation present  Tumor size 6 cm   Endometrial adenocarcinoma ([place])  10/26/2021 Initial Biopsy   EMB: grade 1 endometrioid adenocarcinoma   11/21/2021 Imaging   CT A/P: 1. No evidence for metastatic disease in the abdomen or pelvis. 2. Endometrium appears thickened, but not well demonstrated by CT. Endometrial stripe appears to be 4.7 cm in thickness on sagittal CT imaging today. 3. Complex paraumbilical hernia contains only fat. 4. Skin thickening with soft tissue stranding noted anterior lower abdominal wall. This could be related to cellulitis and should be amenable to clinical inspection. 5. Advanced coronary artery calcification in the left coronary distribution.   12/06/2021 Initial Diagnosis   Endometrial adenocarcinoma ([place])   12/06/2021 Surgery   Robotic-assisted laparoscopic total hysterectomy with bilateral salpingoophorectomy, laparoscopic hernia reduction Findings: On EUA, 12 cm mobile uterus.  On intra-abdominal entry, normal appearing upper abdominal survey although right liver edge and diaphragm obscured secondary to adhesions from cholecystectomy.  Normal-appearing omentum with portion of the omentum within a 3 cm umbilical hernia (reduced).  Normal-appearing small and large bowel.  Normal-appearing bilateral fallopian tubes and ovaries.  Uterus 12 cm in quite bulbous.  No obvious adenopathy.  No ascites.  No clear evidence of intra-abdominal or pelvic disease.   12/13/2021 Pathology Results   A. UTERUS, CERVIX, TUBES AND OVARIES:  - Endometrial adenocarcinoma with foci of squamous differentiation, FIGO  grade 1  - Carcinoma invades for a depth of 1.6 cm where myometrial thickness is  2.8 cm ( 60%)  - Carcinoma does not involve the  cervix  - Benign bilateral fallopian tubes and ovaries  - See oncology table   ONCOLOGY TABLE:   UTERUS, CARCINOMA OR CARCINOSARCOMA: Resection   Procedure: Total hysterectomy and bilateral salpingo-oophorectomy  Histologic Type: Endometrioid carcinoma with foci of squamous  differentiation  Histologic Grade: FIGO grade 1  Myometrial Invasion:       Depth of Myometrial Invasion (mm): 16 mm       Myometrial Thickness (mm): 28 mm       Percentage of Myometrial Invasion: 57%  Uterine Serosa Involvement: Not identified  Cervical stromal Involvement: Not identified  Extent of involvement of other tissue/organs: Not identified  Peritoneal/Ascitic Fluid: Not applicable  Lymphovascular Invasion: Not identified  Regional Lymph Nodes: Not applicable (no lymph nodes submitted or found)        Pelvic Lymph Nodes Examined:                                   0 Sentinel                                   0 Non-sentinel                                   0 Total       Pelvic Lymph Nodes with Metastasis: Not applicable       Para-aortic Lymph Nodes Examined:  0 Sentinel                                    0 non-sentinel                                    0 total       Para-aortic Lymph Nodes with Metastasis: Not applicable  Distant Metastasis:       Distant Site(s) Involved: Not applicable  Pathologic Stage Classification (pTNM, AJCC 8th Edition): pT1b, pN not  assigned  Ancillary Studies: MMR / MSI testing will be ordered  Representative Tumor Block: A3    01/22/2022 - 03/07/2022 Radiation Therapy   01/22/2022 through 03/07/2022 Site Technique Total Dose (Gy) Dose per Fx (Gy) Completed Fx Beam Energies  Vagina: Pelvis HDR-brachy 30/30 6 5/5 [id number]         Interval History: Doing well.  Had 1 episode of diarrhea during radiation, otherwise denies any side effects.  Is using her vaginal dilator regularly.  Denies any vaginal bleeding or discharge.  Reports  normal bowel and bladder function.  Has some mild pain at the very beginning of intercourse, and can have some burning with urination right after intercourse.    Past Medical/Surgical History: Past Medical History:  Diagnosis Date   Anemia    Arthritis    "back, hands, hips" (02/22/2015)   CAD (coronary artery disease)    a. complex LAD/diagonal bifurcation PCI in 2010. b. STEMI 10/2019 s/p PTCA/DES x1 to mLAD overlapping the old stent, residual disease treated medicaly.   Cancer [facility])    uterine   CHF (congestive heart failure) ([place])    Depression    Diabetes mellitus ([place])    "started when I was pregnant; not sure if it was type 1 or type 2"    History of radiation therapy    Endometrium- HDR 01/22/22-03/07/22- Dr. Gery Pray   Hypercholesterolemia    Hypertension    Morbid obesity ([place])    Myocardial infarction ([place])    mild x 3   Neuromuscular disorder ([place])    neuropathy feet   Sleep apnea    cpap/    Past Surgical History:  Procedure Laterality Date   CARDIAC CATHETERIZATION  12/02/2012   CHOLECYSTECTOMY OPEN  09/04/1987   CORONARY ANGIOPLASTY WITH STENT PLACEMENT  09/03/2009   CORONARY/GRAFT ACUTE MI REVASCULARIZATION N/A 10/26/2019   Procedure: CORONARY/GRAFT ACUTE MI REVASCULARIZATION;  Surgeon: Burnell Blanks, MD;  Location: [place] CV LAB;  Service: Cardiovascular;  Laterality: N/A;   INCISIONAL HERNIA REPAIR N/A 12/09/2021   Procedure: LAPAROSCOPIC REPAIR UMBILICAL HERNIA WITH MESH;  Surgeon: Coralie Keens, MD;  Location: [facility] OR[facility];  Service: General;  Laterality: N/A;   LEFT HEART CATH AND CORONARY ANGIOGRAPHY N/A 10/26/2019   Procedure: LEFT HEART CATH AND CORONARY ANGIOGRAPHY;  Surgeon: Burnell Blanks, MD;  Location: [place] CV LAB;  Service: Cardiovascular;  Laterality: N/A;   LEFT HEART CATHETERIZATION WITH CORONARY ANGIOGRAM N/A 12/25/2012   Procedure: LEFT HEART CATHETERIZATION WITH CORONARY ANGIOGRAM;  Surgeon: Sherren Mocha, MD;   Location: [facility] CATH LAB;  Service: Cardiovascular;  Laterality: N/A;   ROBOTIC ASSISTED TOTAL HYSTERECTOMY WITH BILATERAL SALPINGO OOPHERECTOMY N/A 12/06/2021   Procedure: XI ROBOTIC ASSISTED TOTAL HYSTERECTOMY WITH BILATERAL SALPINGO OOPHORECTOMY;  Surgeon: Lafonda Mosses, MD;  Location: [facility] OR[facility];  Service: Gynecology;  Laterality:  N/A;   UMBILICAL HERNIA REPAIR  05/04/1989   doesn't think they used mesh    Family History  Problem Relation Age of Onset   Coronary artery disease Mother        in her mid 40s   Hypertension Mother    Cancer Mother        skin   Heart attack Father        in his mid 78s   COPD Father    Stroke Sister    Coronary artery disease Sister    Diabetes Sister    Other Half-Sister    Thyroid cancer Daughter    Prostate cancer Neg Hx    Colon cancer Neg Hx    Breast cancer Neg Hx    Endometrial cancer Neg Hx    Ovarian cancer Neg Hx    Pancreatic cancer Neg Hx     Social History   Socioeconomic History   Marital status: Married    Spouse name: Not on file   Number of children: 2   Years of education: Not on file   Highest education level: Not on file  Occupational History   Occupation: Housewife  Tobacco Use   Smoking status: Former    Packs/day: 0.50    Years: 1.00    Total pack years: 0.50    Types: Cigarettes, Cigars    Quit date: 09/03/1986    Years since quitting: 35.8   Smokeless tobacco: Never  Vaping Use   Vaping Use: Never used  Substance and Sexual Activity   Alcohol use: No   Drug use: No   Sexual activity: Not Currently  Other Topics Concern   Not on file  Social History Narrative   Not on file   Social Determinants of Health   Financial Resource Strain: High Risk (02/10/2021)   Overall Financial Resource Strain (CARDIA)    Difficulty of Paying Living Expenses: Hard  Food Insecurity: Food Insecurity Present (02/10/2021)   Hunger Vital Sign    Worried About Running Out of Food in the Last Year: Sometimes true    Ran Out  of Food in the Last Year: Sometimes true  Transportation Needs: No Transportation Needs (02/10/2021)   PRAPARE - Hydrologist (Medical): No    Lack of Transportation (Non-Medical): No  Physical Activity: Not on file  Stress: Not on file  Social Connections: Not on file    Current Medications:  Current Outpatient Medications:    aspirin 81 MG EC tablet, Take 1 tablet (81 mg total) by mouth daily., Disp: 30 tablet, Rfl: 11   atorvastatin (LIPITOR) 80 MG tablet, Take 1 tablet (80 mg total) by mouth every evening., Disp: 30 tablet, Rfl: 6   Blood Glucose Monitoring Suppl (TRUE METRIX METER) DEVI, 1 each by Does not apply route 3 (three) times daily., Disp: 1 Device, Rfl: 0   empagliflozin (JARDIANCE) 10 MG TABS tablet, Take 1 tablet by mouth daily . (Patient taking differently: Take 25 mg by mouth daily.), Disp: 30 tablet, Rfl: 6   eszopiclone (LUNESTA) 1 MG TABS tablet, Take 1 tablet (1 mg total) by mouth at bedtime as needed for sleep. Take immediately before bedtime, Disp: 30 tablet, Rfl: 0   FLUoxetine (PROZAC) 20 MG capsule, Take 1 capsule (20 mg total) by mouth daily., Disp: 30 capsule, Rfl: 6   furosemide (LASIX) 40 MG tablet, Take 1 tablet by mouth once daily, Disp: 30 tablet, Rfl: 0   glucose blood (TRUE METRIX  BLOOD GLUCOSE TEST) test strip, Use as instructed, Disp: 100 each, Rfl: 5   insulin aspart (NOVOLOG) 100 UNIT/ML injection, Inject 10-20 Units into the skin in the morning, at noon, and at bedtime. If BS<150=Take 10 units or If BS>150=Take 20 units, Disp: , Rfl:    Misc. Devices MISC, Portable oxygen concentrator, 2L oxygen.  Diagnoses-chronic respiratory failure with hypoxia, Disp: 1 each, Rfl: 0   Multiple Minerals (CALCIUM-MAGNESIUM-ZINC) TABS, Take 1 tablet by mouth daily., Disp: , Rfl:    nitroGLYCERIN (NITROSTAT) 0.4 MG SL tablet, Place 1 tablet (0.4 mg total) under the tongue every 5 (five) minutes as needed for chest pain (up to 3 doses. If  taking 3rd dose call 911)., Disp: 25 tablet, Rfl: 3   prasugrel (EFFIENT) 5 MG TABS tablet, Take 1 tablet (5 mg total) by mouth daily., Disp: 90 tablet, Rfl: 2   sacubitril-valsartan (ENTRESTO) 24-26 MG, Take 1 tablet by mouth 2 (two) times daily., Disp: 180 tablet, Rfl: 3   Semaglutide, 1 MG/DOSE, (OZEMPIC, 1 MG/DOSE,) 4 MG/3ML SOPN, Inject 1mg every week subcutaneously, Disp: 3 mL, Rfl: 3   TRUEPLUS LANCETS 30G MISC, 1 each by Does not apply route 3 (three) times daily., Disp: 100 each, Rfl: 5  Review of Systems: Denies appetite changes, fevers, chills, fatigue, unexplained weight changes. Denies hearing loss, neck lumps or masses, mouth sores, ringing in ears or voice changes. Denies cough or wheezing.  Denies shortness of breath. Denies chest pain or palpitations. Denies leg swelling. Denies abdominal distention, pain, blood in stools, constipation, diarrhea, nausea, vomiting, or early satiety. Denies frequency, hematuria or incontinence. Denies hot flashes, pelvic pain, vaginal bleeding or vaginal discharge.   Denies joint pain, back pain or muscle pain/cramps. Denies itching, rash, or wounds. Denies dizziness, headaches, numbness or seizures. Denies swollen lymph nodes or glands, denies easy bruising or bleeding. Denies anxiety, depression, confusion, or decreased concentration.  Physical Exam: BP (!) 157/61 (BP Location: Right Arm, Patient Position: Sitting)   Pulse 72   Temp 98.1 F (36.7 C) (Oral)   Resp 17   Ht 5' 2" (1.575 m)   Wt 270 lb 2 oz (122.5 kg)   LMP 10/30/2014 (Approximate)   SpO2 97%   BMI 49.41 kg/m  General: Alert, oriented, no acute distress. HEENT: Normocephalic, atraumatic, sclera anicteric. Chest: Distant breath sounds but clear to auscultation bilaterally.  Unlabored breathing on oxygen by nasal cannula. Cardiovascular: Regular rate and rhythm, no murmurs. Abdomen: Obese, soft, nontender.  Normoactive bowel sounds.  No masses or hepatosplenomegaly  appreciated.  Well-healed incisions.  Some induration of her pannus without any evidence of erythema. Extremities: Grossly normal range of motion.  Warm, well perfused.  Trace edema bilaterally. Skin: No rashes or lesions noted. Lymphatics: No cervical, supraclavicular, or inguinal adenopathy. GU: Normal appearing external genitalia without erythema, excoriation, or lesions.  Speculum exam reveals no blood or discharge, cuff intact, minimal radiation changes, no visible lesions.  Bimanual exam reveals cuff intact, no nodularity or masses.  Rectovaginal exam confirms findings.  Laboratory & Radiologic Studies: None new  Assessment & Plan: Brenda [name] is a 61 y.o. 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 No LVI. MSI-high. MMR IHC with loss of MLH1 and PMS2.  MLH1 promoter hypermethylation present. Discussed options for adjuvant treatment.  Ultimately elected for treated with adjuvant VBT, completed in 03/2022. Presents for surveillance.  Overall doing well.  Is NED on exam today.  Per NCCN surveillance recommendations, discussed plan   for surveillance visits every 3 months alternating between my office and radiation oncology.  She will see Dr. Kinard in January and sees me again in April.  We discussed signs and symptoms that would be concerning for cancer recurrence, and I stressed the importance of calling if she develops any of these.  Discussed using more lubrication with intercourse.  If continues to have dyspareunia with insertion, could consider vaginal estrogen.  Patient was encouraged and congratulated on the weight loss that she is achieved so far.  22 minutes of total time was spent for this patient encounter, including preparation, face-to-face counseling with the patient and coordination of care, and documentation of the encounter.  Katherine Tucker, MD  Division of Gynecologic Oncology  Department of Obstetrics and  Gynecology  [facility]   

## 2022-07-05 ENCOUNTER — Other Ambulatory Visit: Payer: Self-pay | Admitting: Nurse Practitioner

## 2022-07-05 DIAGNOSIS — Z1239 Encounter for other screening for malignant neoplasm of breast: Secondary | ICD-10-CM

## 2022-07-13 ENCOUNTER — Ambulatory Visit
Admission: RE | Admit: 2022-07-13 | Discharge: 2022-07-13 | Disposition: A | Discharge status: Home / Self Care | Payer: Medicaid Other | Source: Ambulatory Visit | Attending: Nurse Practitioner | Admitting: Nurse Practitioner

## 2022-07-13 DIAGNOSIS — Z1239 Encounter for other screening for malignant neoplasm of breast: Secondary | ICD-10-CM

## 2022-07-16 ENCOUNTER — Encounter: Payer: Self-pay | Admitting: Cardiovascular Disease

## 2022-07-16 ENCOUNTER — Ambulatory Visit: Payer: Medicaid Other | Attending: Cardiovascular Disease | Admitting: Cardiovascular Disease

## 2022-07-16 VITALS — BP 108/60 | HR 88 | Ht 62.0 in | Wt 262.4 lb

## 2022-07-16 DIAGNOSIS — I5032 Chronic diastolic (congestive) heart failure: Secondary | ICD-10-CM | POA: Diagnosis not present

## 2022-07-16 DIAGNOSIS — I10 Essential (primary) hypertension: Secondary | ICD-10-CM

## 2022-07-16 DIAGNOSIS — I2510 Atherosclerotic heart disease of native coronary artery without angina pectoris: Secondary | ICD-10-CM

## 2022-07-16 DIAGNOSIS — E1169 Type 2 diabetes mellitus with other specified complication: Secondary | ICD-10-CM

## 2022-07-16 DIAGNOSIS — Z9861 Coronary angioplasty status: Secondary | ICD-10-CM

## 2022-07-16 DIAGNOSIS — I25118 Atherosclerotic heart disease of native coronary artery with other forms of angina pectoris: Secondary | ICD-10-CM

## 2022-07-16 DIAGNOSIS — E785 Hyperlipidemia, unspecified: Secondary | ICD-10-CM

## 2022-07-16 MED ORDER — ENTRESTO 24-26 MG PO TABS: 1.0000 | ORAL_TABLET | Freq: Two times a day (BID) | ORAL | 3 refills | Status: DC

## 2022-07-16 MED ORDER — NITROGLYCERIN 0.4 MG SL SUBL: SUBLINGUAL_TABLET | SUBLINGUAL | 6 refills | Status: DC

## 2022-07-16 MED ORDER — PRASUGREL HCL 5 MG PO TABS: 5.0000 mg | ORAL_TABLET | Freq: Every day | ORAL | 3 refills | Status: DC

## 2022-07-16 NOTE — Progress Notes (Signed)
Cardiology Office Note:    Date:  07/16/2022   ID:  Brenda [name], DOB 1961/01/25, MRN [id number]  PCP:  Charlott Rakes, MD   [place] Providers Cardiologist:  Sherren Mocha, MD     Referring MD: Charlott Rakes, MD   Chief Complaint  Patient presents with   Coronary Artery Disease    History of Present Illness:    Brenda [name] is a 61 y.o. female with a hx of: Coronary artery disease  S/p non-STEMI in 2000 tx w/ complex bifurcational LAD/Dx stenting Allergic to Plavix Cath 4/14: mod ISR in LAD and severe ISR in Dx >> med Rx S/p anterior STEMI 10/2019 >> Rx with DES to LAD (overlapping with old stent); chronic ost Dx stenosis (jailed by stent)  Myoview 3/23: Low risk (HFpEF) heart failure with preserved ejection fraction  RV Failure  Chronic respiratory failure Chronic O2 Diabetes mellitus, insulin dependent  OSA  Hypertension  Hyperlipidemia  Morbid obesity  Endometrial CA s/p Hysterectomy w BSO Admx with SBO 12/2021  The patient is here alone today.  She feels like she is doing very well.  She is status post hysterectomy for treatment of endometrial cancer.  She is lost a lot of weight on Ozempic and continues to work hard at this.  She denies chest pain, chest pressure, edema, orthopnea, or PND.  She is on 24/7 home oxygen and is followed by pulmonary.  Otherwise reports no changes in any symptoms.  Feels markedly improved from her baseline over the last 3 to 4 years.  Past Medical History:  Diagnosis Date   Anemia    Arthritis    "back, hands, hips" (02/22/2015)   CAD (coronary artery disease)    a. complex LAD/diagonal bifurcation PCI in 2010. b. STEMI 10/2019 s/p PTCA/DES x1 to mLAD overlapping the old stent, residual disease treated medicaly.   Cancer [facility])    uterine   CHF (congestive heart failure) ([place])    Depression    Diabetes mellitus ([place])    "started when I was pregnant; not sure if it was type 1 or type 2"    History of  radiation therapy    Endometrium- HDR 01/22/22-03/07/22- Dr. Gery Pray   Hypercholesterolemia    Hypertension    Morbid obesity ([place])    Myocardial infarction ([place])    mild x 3   Neuromuscular disorder ([place])    neuropathy feet   Sleep apnea    cpap/    Past Surgical History:  Procedure Laterality Date   CARDIAC CATHETERIZATION  12/02/2012   CHOLECYSTECTOMY OPEN  09/04/1987   CORONARY ANGIOPLASTY WITH STENT PLACEMENT  09/03/2009   CORONARY/GRAFT ACUTE MI REVASCULARIZATION N/A 10/26/2019   Procedure: CORONARY/GRAFT ACUTE MI REVASCULARIZATION;  Surgeon: Burnell Blanks, MD;  Location: [place] [facility];  Service: Cardiovascular;  Laterality: N/A;   INCISIONAL HERNIA REPAIR N/A 12/09/2021   Procedure: LAPAROSCOPIC REPAIR UMBILICAL HERNIA WITH MESH;  Surgeon: Coralie Keens, MD;  Location: [facility];  Service: General;  Laterality: N/A;   LEFT HEART CATH AND CORONARY ANGIOGRAPHY N/A 10/26/2019   Procedure: LEFT HEART CATH AND CORONARY ANGIOGRAPHY;  Surgeon: Burnell Blanks, MD;  Location: [place] [facility];  Service: Cardiovascular;  Laterality: N/A;   LEFT HEART CATHETERIZATION WITH CORONARY ANGIOGRAM N/A 12/25/2012   Procedure: LEFT HEART CATHETERIZATION WITH CORONARY ANGIOGRAM;  Surgeon: Sherren Mocha, MD;  Location: [facility] CATH LAB;  Service: Cardiovascular;  Laterality: N/A;   ROBOTIC ASSISTED TOTAL HYSTERECTOMY WITH BILATERAL SALPINGO OOPHERECTOMY  N/A 12/06/2021   Procedure: XI ROBOTIC ASSISTED TOTAL HYSTERECTOMY WITH BILATERAL SALPINGO OOPHORECTOMY;  Surgeon: Lafonda Mosses, MD;  Location: [facility];  Service: Gynecology;  Laterality: N/A;   UMBILICAL HERNIA REPAIR  05/04/1989   doesn't think they used mesh    Current Medications: Current Meds  Medication Sig   aspirin 81 MG EC tablet Take 1 tablet (81 mg total) by mouth daily.   atorvastatin (LIPITOR) 80 MG tablet Take 1 tablet (80 mg total) by mouth every evening.   Continuous Blood Gluc Sensor (DEXCOM G7  SENSOR) MISC USE AS DIRECTED TO CHECK SUGAR CONTINUOUSLY   eszopiclone (LUNESTA) 1 MG TABS tablet Take 1 tablet (1 mg total) by mouth at bedtime as needed for sleep. Take immediately before bedtime   FLUoxetine (PROZAC) 20 MG capsule Take 1 capsule (20 mg total) by mouth daily.   furosemide (LASIX) 40 MG tablet Take 1 tablet by mouth once daily   insulin aspart (NOVOLOG) 100 UNIT/ML injection Inject 10-20 Units into the skin in the morning, at noon, and at bedtime. If BS<150=Take 10 units or If BS>150=Take 20 units   JARDIANCE 25 MG TABS tablet Take 25 mg by mouth daily.   metFORMIN (GLUCOPHAGE-XR) 500 MG 24 hr tablet Take 500 mg by mouth 2 (two) times daily.   Misc. Devices MISC Portable oxygen concentrator, 2L oxygen.  Diagnoses-chronic respiratory failure with hypoxia   Semaglutide, 1 MG/DOSE, (OZEMPIC, 1 MG/DOSE,) 4 MG/3ML SOPN Inject '1mg'$  every week subcutaneously   [DISCONTINUED] nitroGLYCERIN (NITROSTAT) 0.4 MG SL tablet Place 1 tablet (0.4 mg total) under the tongue every 5 (five) minutes as needed for chest pain (up to 3 doses. If taking 3rd dose call 911).   [DISCONTINUED] prasugrel (EFFIENT) 5 MG TABS tablet Take 1 tablet (5 mg total) by mouth daily.   [DISCONTINUED] sacubitril-valsartan (ENTRESTO) 24-26 MG Take 1 tablet by mouth 2 (two) times daily.     Allergies:   Hydrocodone, Clopidogrel, and Clopidogrel bisulfate   Social History   Socioeconomic History   Marital status: Married    Spouse name: Not on file   Number of children: 2   Years of education: Not on file   Highest education level: Not on file  Occupational History   Occupation: Housewife  Tobacco Use   Smoking status: Former    Packs/day: 0.50    Years: 1.00    Total pack years: 0.50    Types: Cigarettes, Cigars    Quit date: 09/03/1986    Years since quitting: 35.8   Smokeless tobacco: Never  Vaping Use   Vaping Use: Never used  Substance and Sexual Activity   Alcohol use: No   Drug use: No   Sexual  activity: Not Currently  Other Topics Concern   Not on file  Social History Narrative   Not on file   Social Determinants of Health   Financial Resource Strain: High Risk (02/10/2021)   Overall Financial Resource Strain (CARDIA)    Difficulty of Paying Living Expenses: Hard  Food Insecurity: Food Insecurity Present (02/10/2021)   Hunger Vital Sign    Worried About [place] in the Last Year: Sometimes true    Ran Out of Food in the Last Year: Sometimes true  Transportation Needs: No Transportation Needs (02/10/2021)   PRAPARE - Hydrologist (Medical): No    Lack of Transportation (Non-Medical): No  Physical Activity: Not on file  Stress: Not on file  Social Connections: Not on  file     Family History: The patient's family history includes COPD in her father; Cancer in her mother; Coronary artery disease in her mother and sister; Diabetes in her sister; Heart attack in her father; Hypertension in her mother; Other in her half-sister; Stroke in her sister; Thyroid cancer in her daughter. There is no history of Prostate cancer, Colon cancer, Breast cancer, Endometrial cancer, Ovarian cancer, or Pancreatic cancer.  ROS:   Please see the history of present illness.    All other systems reviewed and are negative.  EKGs/Labs/Other Studies Reviewed:    The following studies were reviewed today: Cardiac catheterization 10/26/2019: Prox Cx to Mid Cx lesion is 30% stenosed. Prox LAD to Mid LAD lesion is 10% stenosed. Mid LAD lesion is 100% stenosed. 1st Diag-1 lesion is 80% stenosed. 1st Diag-2 lesion is 70% stenosed. A drug-eluting stent was successfully placed using a SYNERGY XD 2.50X28. Post intervention, there is a 0% residual stenosis.   1. Acute anterior STEMI secondary to occlusion of the mid LAD 2. Successful PTCA/DES x 1 mid LAD (overlapping with old stent) 3. 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 There is  TIMI-3 flow down the Diagonal branch.  4. The Circumflex is a small caliber vessel with mild non-obstructive plaque 5. The RCA is a large dominant vessel with no obstructive disease   Recommendations: Will admit to the ICU. Continue Aggrastat infusion for 2 hours. Will continue DAPT with ASA and Effient for one year. Will start a high intensity statin. Will start Lopressor 25 mg po BID. Will need to review home medications and decide the best way to add in an Ace-inh or ARB. She may do best with Coreg for better BP control. Echo in am. Pharmacy to assist with dosing of insulin. Aggressive risk factor modification.   Myoview stress test 11/29/2021:   The study is normal. The study is low risk.   No ST deviation was noted.   LV perfusion is normal.   Left ventricular function is normal. Nuclear stress EF: 71 %. The left ventricular ejection fraction is hyperdynamic (>65%). End diastolic cavity size is normal. End systolic cavity size is normal.   Prior study not available for comparison.   Normal perfusion. LVEF 71% with normal wall motion. This is a low risk study. No prior for comparison.  Echo 02/08/2021:  1. Left ventricular ejection fraction, by estimation, is 60 to 65%. The  left ventricle has normal function. The left ventricle has no regional  wall motion abnormalities.   2. Right ventricular systolic function is moderately reduced. The right  ventricular size is mildly enlarged.   3. The mitral valve is normal in structure. No evidence of mitral valve  regurgitation. No evidence of mitral stenosis.   4. The aortic valve is normal in structure. Aortic valve regurgitation is  not visualized. No aortic stenosis is present.   5. The inferior vena cava is normal in size with greater than 50%  respiratory variability, suggesting right atrial pressure of 3 mmHg.   EKG:  EKG is not ordered today.    Recent Labs: 10/04/2021: NT-Pro BNP 103 10/18/2021: B Natriuretic Peptide 73.1; TSH  2.827 10/19/2021: Magnesium 2.2 12/09/2021: ALT 14 12/10/2021: BUN 15; Creatinine, Ser 0.91; Potassium 4.0; Sodium 135 12/11/2021: Hemoglobin 10.3; Platelets 336  Recent Lipid Panel    Component Value Date/Time   CHOL 111 11/23/2021 0835   CHOL 194 10/04/2021 0839   TRIG 126 11/23/2021 0835  HDL 39 (L) 11/23/2021 0835   HDL 49 10/04/2021 0839   CHOLHDL 2.8 11/23/2021 0835   VLDL 25 11/23/2021 0835   LDLCALC 47 11/23/2021 0835   LDLCALC 113 (H) 10/04/2021 0839   LDLDIRECT 135.9 02/22/2011 1132     Risk Assessment/Calculations:                Physical Exam:    VS:  BP 108/60   Pulse 88   Ht '5\' 2"'$  (1.575 m)   Wt 262 lb 6.4 oz (119 kg)   LMP 10/30/2014 (Approximate)   SpO2 97% Comment: on 2L of oxygen  BMI 47.99 kg/m     Wt Readings from Last 3 Encounters:  07/16/22 262 lb 6.4 oz (119 kg)  06/26/22 270 lb 2 oz (122.5 kg)  05/27/22 282 lb (127.9 kg)     GEN:  Well nourished, well developed in no acute distress HEENT: Normal NECK: No JVD; No carotid bruits LYMPHATICS: No lymphadenopathy CARDIAC: RRR, no murmurs, rubs, gallops RESPIRATORY:  Clear to auscultation without rales, wheezing or rhonchi  ABDOMEN: Soft, non-tender, non-distended MUSCULOSKELETAL:  No edema; No deformity  SKIN: Warm and dry NEUROLOGIC:  Alert and oriented x 3 PSYCHIATRIC:  Normal affect   ASSESSMENT:    1. Chronic heart failure with preserved ejection fraction ([place])   2. Hyperlipidemia associated with type 2 diabetes mellitus ([place])   3. Coronary artery disease of native artery of native heart with stable angina pectoris ([place])   4. Essential hypertension   5. CAD S/P PCI of LAD    PLAN:    In order of problems listed above:  The patient appears clinically stable and euvolemic on her regimen of Jardiance, furosemide, and Entresto.  Current medications will be continued.  Most recent echo reviewed as above with preserved LVEF. Treated with atorvastatin 80 mg daily.  Lipids been excellent  with a cholesterol of 111, HDL 39, and LDL 47.  The patient has done an excellent job with weight reduction, aided by the use of Ozempic.  Continue the same. Doing well.  Maintained on aspirin and prasugrel 5 mg.  Had a second event in 2021.  She was treated with repeat PCI at that time.  No angina since then.  Patient is clopidogrel allergic. Blood pressure is under optimal control on her current regimen. As above     Medication Adjustments/Labs and Tests Ordered: Current medicines are reviewed at length with the patient today.  Concerns regarding medicines are outlined above.  No orders of the defined types were placed in this encounter.  Meds ordered this encounter  Medications   prasugrel (EFFIENT) 5 MG TABS tablet    Sig: Take 1 tablet (5 mg total) by mouth daily.    Dispense:  90 tablet    Refill:  3   sacubitril-valsartan (ENTRESTO) 24-26 MG    Sig: Take 1 tablet by mouth 2 (two) times daily.    Dispense:  180 tablet    Refill:  3   nitroGLYCERIN (NITROSTAT) 0.4 MG SL tablet    Sig: Dissolve 1 tablet under the tongue every 5 minutes as needed for chest pain. Max of 3 doses, then 911.    Dispense:  25 tablet    Refill:  6    Patient Instructions  Medication Instructions:  Your physician recommends that you continue on your current medications as directed. Please refer to the Current Medication list given to you today.  *If you need a refill on your cardiac medications  before your next appointment, please call your pharmacy*   Lab Work: NONE If you have labs (blood work) drawn today and your tests are completely normal, you will receive your results only by: [place] (if you have MyChart) OR A paper copy in the mail If you have any lab test that is abnormal or we need to change your treatment, we will call you to review the results.   Testing/Procedures: NONE   Follow-Up: At [facility], you and your health needs are our priority.  As part of our  continuing mission to provide you with exceptional heart care, we have created designated Provider Care Teams.  These Care Teams include your primary Cardiologist (physician) and Advanced Practice Providers (APPs -  Physician Assistants and Nurse Practitioners) who all work together to provide you with the care you need, when you need it.  We recommend signing up for the patient portal called "MyChart".  Sign up information is provided on this After Visit Summary.  MyChart is used to connect with patients for Virtual Visits (Telemedicine).  Patients are able to view lab/test results, encounter notes, upcoming appointments, etc.  Non-urgent messages can be sent to your provider as well.   To learn more about what you can do with MyChart, go to [email or web address].    Your next appointment:   6 month(s)  The format for your next appointment:   In Person  Provider:   Nicholes Rough, PA-C, Melina Copa, PA-C, Ambrose Pancoast, NP, Christen Bame, NP, or Richardson Dopp, PA-C     Then, Sherren Mocha, MD will plan to see you again in 1 year(s).      Important Information About Sugar         Signed, Sherren Mocha, MD  07/16/2022 8:42 PM    [place]

## 2022-07-16 NOTE — Patient Instructions (Signed)
Medication Instructions:  Your physician recommends that you continue on your current medications as directed. Please refer to the Current Medication list given to you today.  *If you need a refill on your cardiac medications before your next appointment, please call your pharmacy*   Lab Work: NONE If you have labs (blood work) drawn today and your tests are completely normal, you will receive your results only by: [place] (if you have MyChart) OR A paper copy in the mail If you have any lab test that is abnormal or we need to change your treatment, we will call you to review the results.   Testing/Procedures: NONE   Follow-Up: At [facility], you and your health needs are our priority.  As part of our continuing mission to provide you with exceptional heart care, we have created designated Provider Care Teams.  These Care Teams include your primary Cardiologist (physician) and Advanced Practice Providers (APPs -  Physician Assistants and Nurse Practitioners) who all work together to provide you with the care you need, when you need it.  We recommend signing up for the patient portal called "MyChart".  Sign up information is provided on this After Visit Summary.  MyChart is used to connect with patients for Virtual Visits (Telemedicine).  Patients are able to view lab/test results, encounter notes, upcoming appointments, etc.  Non-urgent messages can be sent to your provider as well.   To learn more about what you can do with MyChart, go to [email or web address].    Your next appointment:   6 month(s)  The format for your next appointment:   In Person  Provider:   Nicholes Rough, PA-C, Melina Copa, PA-C, Ambrose Pancoast, NP, Christen Bame, NP, or Richardson Dopp, PA-C     Then, Sherren Mocha, MD will plan to see you again in 1 year(s).      Important Information About Sugar

## 2022-08-03 ENCOUNTER — Telehealth: Payer: Self-pay | Admitting: Pulmonary Disease

## 2022-08-03 NOTE — Telephone Encounter (Signed)
Message not needed.Brenda [name]

## 2022-08-09 ENCOUNTER — Telehealth: Payer: Self-pay | Admitting: Pulmonary Disease

## 2022-08-10 NOTE — Telephone Encounter (Signed)
Spoke to [employer] with [employer]. She stated that she does not see documentation within patient's chart regarding PA. Nothing further needed.

## 2022-08-10 NOTE — Telephone Encounter (Signed)
Need additional information to continue, no documentation of previous issue for a PA request in patients chart.

## 2022-08-17 DIAGNOSIS — E1140 Type 2 diabetes mellitus with diabetic neuropathy, unspecified: Secondary | ICD-10-CM | POA: Insufficient documentation

## 2022-08-17 DIAGNOSIS — L600 Ingrowing nail: Secondary | ICD-10-CM | POA: Insufficient documentation

## 2022-08-17 DIAGNOSIS — R300 Dysuria: Secondary | ICD-10-CM | POA: Insufficient documentation

## 2022-08-31 ENCOUNTER — Telehealth: Payer: Self-pay | Admitting: *Deleted

## 2022-08-31 NOTE — Telephone Encounter (Signed)
Scheduled the patient for a a follow up appt with Dr Berline Lopes on 4/26

## 2022-09-06 NOTE — Progress Notes (Signed)
Brenda [name] is here today for follow up post radiation to the pelvic.  They completed their radiation on: 01/22/22 to 03/07/22   Does the patient complain of any of the following:  Pain:No Abdominal bloating: No Diarrhea/Constipation: Some diarrhea  due to medication taking. Nausea/Vomiting: no Vaginal Discharge: no Blood in Urine or Stool: no Urinary Issues (dysuria/incomplete emptying/ incontinence/ increased frequency/urgency): some urgency due to medication Does patient report using vaginal dilator 2-3 times a week and/or sexually active 2-3 weeks: using as ordered Post radiation skin changes: none   Additional comments if applicable: Vitals:   82/50/03 1101  BP: (!) 116/59  Pulse: 85  Resp: 19  Temp: 97.9 F (36.6 C)  TempSrc: Oral  SpO2: 100%  Weight: 112.2 kg  .

## 2022-09-09 NOTE — Progress Notes (Signed)
Radiation Oncology         [phone]) [phone] ________________________________  Name: Brenda [name] MRN: [id number]  Date: 09/10/2022  DOB: [birth year]  Follow-Up Visit Note  CC: Charlott Rakes, MD  Charlott Rakes, MD    ICD-10-CM   1. Endometrial cancer ([place])  C54.1     2. Endometrial adenocarcinoma ([facility])  C54.1       Diagnosis: The encounter diagnosis was Endometrial adenocarcinoma ([place]).   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 She was last seen here for follow-up on 04/09/22. Since her last visit, the patient followed up with Dr. Berline Lopes on 06/26/22. During which time, the patient reported having some mild pain at the beginning of intercourse and burning with urination following intercourse. She otherwise denied any symptoms concerning for disease recurrence and was noted to be NED on examination. For her intercourse related discomfort, Dr. Berline Lopes advised her to use more lubrication with intercourse. (The patient was also noted to report regular vaginal dilator use).                         Pertinent imaging performed in the interval includes a bilateral screening mammogram on 07/13/22 which showed no evidence of malignancy in either breast.   On evaluation today she continues to use her vaginal dilator as recommended.  She denies any further pain with intercourse.  She denies any postcoital bleeding.  She denies any generalized pelvic pain or abdominal bloating.  He denies any hematuria or rectal bleeding.  She denies any vaginal bleeding.  Allergies:  is allergic to  hydrocodone, clopidogrel, and clopidogrel bisulfate.  Meds: Current Outpatient Medications  Medication Sig Dispense Refill   aspirin 81 MG EC tablet Take 1 tablet (81 mg total) by mouth daily. 30 tablet 11   atorvastatin (LIPITOR) 80 MG tablet Take 1 tablet (80 mg total) by mouth every evening. 30 tablet 6   Continuous Blood Gluc Sensor (DEXCOM G7 SENSOR) MISC USE AS DIRECTED TO CHECK SUGAR CONTINUOUSLY     FLUoxetine (PROZAC) 20 MG capsule Take 1 capsule (20 mg total) by mouth daily. 30 capsule 6   furosemide (LASIX) 40 MG tablet Take 1 tablet by mouth once daily 30 tablet 0   insulin aspart (NOVOLOG) 100 UNIT/ML injection Inject 10-20 Units into the skin in the morning, at noon, and at bedtime. If BS<150=Take 10 units or If BS>150=Take 20 units     JARDIANCE 25 MG TABS tablet Take 25 mg by mouth daily.     metFORMIN (GLUCOPHAGE-XR) 500 MG 24 hr tablet Take 500 mg by mouth 2 (two) times daily.     Misc. Devices MISC Portable oxygen concentrator, 2L oxygen.  Diagnoses-chronic respiratory failure with hypoxia 1 each 0   nitroGLYCERIN (NITROSTAT) 0.4 MG SL tablet Dissolve 1 tablet under the tongue every 5 minutes as needed for chest pain. Max of 3 doses, then 911. 25 tablet 6   prasugrel (EFFIENT) 5 MG TABS tablet Take 1 tablet (5 mg total) by mouth daily. 90 tablet 3   sacubitril-valsartan (ENTRESTO) 24-26 MG Take 1 tablet by mouth 2 (  two) times daily. 180 tablet 3   Semaglutide, 1 MG/DOSE, (OZEMPIC, 1 MG/DOSE,) 4 MG/3ML SOPN Inject '1mg'$  every week subcutaneously 3 mL 3   eszopiclone (LUNESTA) 1 MG TABS tablet Take 1 tablet (1 mg total) by mouth at bedtime as needed for sleep. Take immediately before bedtime (Patient not taking: Reported on 09/10/2022) 30 tablet 0   No current facility-administered medications for this encounter.    Physical Findings: The patient is in no acute distress. Patient is alert and oriented.  weight is 247 lb 6.4 oz (112.2 kg). Her oral temperature is 97.9 F (36.6  C). Her blood pressure is 116/59 (abnormal) and her pulse is 85. Her respiration is 19 and oxygen saturation is 100%. .  No significant changes. Lungs are clear to auscultation bilaterally. Heart has regular rate and rhythm. No palpable cervical, supraclavicular, or axillary adenopathy. Abdomen soft, non-tender, normal bowel sounds.  On pelvic examination the external genitalia were unremarkable. A speculum exam was performed. There are no mucosal lesions noted in the vaginal vault.  Some radiation changes noted at the vaginal cuff.  On bimanual and rectovaginal examination there were no pelvic masses appreciated.  Vaginal cuff intact    Lab Findings: Lab Results  Component Value Date   WBC 16.9 (H) 12/11/2021   HGB 10.3 (L) 12/11/2021   HCT 34.1 (L) 12/11/2021   MCV 86.1 12/11/2021   PLT 336 12/11/2021    Radiographic Findings: No results found.  Impression:  The encounter diagnosis was Endometrial adenocarcinoma ([place]).   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  She does not appear to be exhibiting any long-term effects after her surgery and vaginal brachytherapy.  Plan: She will follow-up with Dr. Berline Lopes in 3 months.  Routine follow-up in radiation oncology in 6 months.   25 minutes of total time was spent for this patient encounter, including preparation, face-to-face counseling with the patient and coordination of care, physical exam, and documentation of the encounter. ____________________________________  Blair Promise, PhD, MD  This document serves as a record of services personally performed by Gery Pray, MD. It was created on his behalf by Roney Mans, a trained medical scribe. The creation of this record is based on the scribe's personal observations and the provider's statements to them. This document has been  checked and approved by the attending provider.

## 2022-09-10 ENCOUNTER — Encounter: Payer: Self-pay | Admitting: Radiation Oncology

## 2022-09-10 ENCOUNTER — Ambulatory Visit
Admission: RE | Admit: 2022-09-10 | Discharge: 2022-09-10 | Disposition: A | Discharge status: Home / Self Care | Payer: Medicaid Other | Source: Ambulatory Visit | Attending: Radiation Oncology | Admitting: Radiation Oncology

## 2022-09-10 VITALS — BP 116/59 | HR 85 | Temp 97.9°F | Resp 19 | Wt 247.4 lb

## 2022-09-10 DIAGNOSIS — Z90710 Acquired absence of both cervix and uterus: Secondary | ICD-10-CM | POA: Insufficient documentation

## 2022-09-10 DIAGNOSIS — C541 Malignant neoplasm of endometrium: Secondary | ICD-10-CM

## 2022-09-10 DIAGNOSIS — Z7982 Long term (current) use of aspirin: Secondary | ICD-10-CM | POA: Diagnosis not present

## 2022-09-10 DIAGNOSIS — Z7902 Long term (current) use of antithrombotics/antiplatelets: Secondary | ICD-10-CM | POA: Insufficient documentation

## 2022-09-10 DIAGNOSIS — Z90722 Acquired absence of ovaries, bilateral: Secondary | ICD-10-CM | POA: Diagnosis not present

## 2022-09-10 DIAGNOSIS — Z923 Personal history of irradiation: Secondary | ICD-10-CM | POA: Insufficient documentation

## 2022-09-10 DIAGNOSIS — Z7984 Long term (current) use of oral hypoglycemic drugs: Secondary | ICD-10-CM | POA: Insufficient documentation

## 2022-09-10 DIAGNOSIS — Z8541 Personal history of malignant neoplasm of cervix uteri: Secondary | ICD-10-CM | POA: Diagnosis present

## 2022-09-10 DIAGNOSIS — Z79899 Other long term (current) drug therapy: Secondary | ICD-10-CM | POA: Diagnosis not present

## 2022-09-30 ENCOUNTER — Ambulatory Visit (HOSPITAL_BASED_OUTPATIENT_CLINIC_OR_DEPARTMENT_OTHER): Payer: Medicaid Other | Attending: Pulmonary Disease | Admitting: Pulmonary Disease

## 2022-09-30 DIAGNOSIS — G4736 Sleep related hypoventilation in conditions classified elsewhere: Secondary | ICD-10-CM | POA: Diagnosis not present

## 2022-09-30 DIAGNOSIS — R0683 Snoring: Secondary | ICD-10-CM | POA: Diagnosis present

## 2022-09-30 DIAGNOSIS — G4733 Obstructive sleep apnea (adult) (pediatric): Secondary | ICD-10-CM | POA: Insufficient documentation

## 2022-10-06 ENCOUNTER — Telehealth: Payer: Self-pay | Admitting: Pulmonary Disease

## 2022-10-06 DIAGNOSIS — R0683 Snoring: Secondary | ICD-10-CM

## 2022-10-06 NOTE — Telephone Encounter (Signed)
PSG 09/30/22 >> AHI 12, SpO2 low 79%.  Spent 241.3 min TST with SpO2 < 88%.   Please inform her that her sleep study shows mild obstructive sleep apnea and low oxygen at night.  Please arrange for ROV with me or NP to discuss treatment options.

## 2022-10-06 NOTE — Procedures (Signed)
     Patient Name: Brenda, [name] Date: 09/30/2022 Gender: Female D.O.B: 1960-12-03 Age (years): 84 Referring Provider: Chesley Mires MD, ABSM Height (inches): 66 Interpreting Physician: Chesley Mires MD, ABSM Weight (lbs): 250 RPSGT: Zadie Rhine BMI: 46 MRN: [id number] Neck Size: 15.50  CLINICAL INFORMATION Sleep Study Type: NPSG  Indication for sleep study: Diabetes, Fatigue, Obesity, OSA, Snoring  Epworth Sleepiness Score: 4  Most recent polysomnogram dated 05/27/2022 revealed an AHI of 0.0/h and RDI of 6.7/h.  SLEEP STUDY TECHNIQUE As per the AASM Manual for the Scoring of Sleep and Associated Events v2.3 (April 2016) with a hypopnea requiring 4% desaturations.  The channels recorded and monitored were frontal, central and occipital EEG, electrooculogram (EOG), submentalis EMG (chin), nasal and oral airflow, thoracic and abdominal wall motion, anterior tibialis EMG, snore microphone, electrocardiogram, and pulse oximetry.  MEDICATIONS Medications self-administered by patient taken the night of the study : [place] The study was initiated at 10:30:11 PM and ended at 4:43:10 AM.  Sleep onset time was 16.4 minutes and the sleep efficiency was 84.3%. The total sleep time was 314.5 minutes.  Stage REM latency was 81.0 minutes.  The patient spent 3.7% of the night in stage N1 sleep, 87.8% in stage N2 sleep, 0.5% in stage N3 and 8.1% in REM.  Alpha intrusion was absent.  Supine sleep was 0.00%.  RESPIRATORY PARAMETERS The overall apnea/hypopnea index (AHI) was 12.0 per hour. There were 0 total apneas, including 0 obstructive, 0 central and 0 mixed apneas. There were 63 hypopneas and 144 RERAs.  The AHI during Stage REM sleep was 40.0 per hour.  AHI while supine was N/A per hour.  The mean oxygen saturation was 87.2%. The minimum SpO2 during sleep was 79.0%.  She spent 241.3 minutes of sleep time with an SpO2 < 88%.  Moderate snoring was  noted during this study.  CARDIAC DATA The 2 lead EKG demonstrated sinus rhythm. The mean heart rate was 74.2 beats per minute. Other EKG findings include: None.  LEG MOVEMENT DATA The total PLMS were 0 with a resulting PLMS index of 0.0. Associated arousal with leg movement index was 3.4 .  IMPRESSIONS - Mild obstructive sleep apnea with an AHI of 12 and SpO2 low of 79%. - She spent 241.3 minutes of sleep time with an SpO2 < 88%.  This is suggestive of sleep related hypoxia/hypoventilaton. - The patient snored with moderate snoring volume. - No cardiac abnormalities were noted during this study. - Clinically significant periodic limb movements did not occur during sleep. No significant associated arousals.  DIAGNOSIS - Obstructive Sleep Apnea (G47.33) - Nocturnal Hypoxemia (G47.36)  RECOMMENDATIONS - Therapeutic CPAP titration to determine optimal pressure required to alleviate sleep disordered breathing. - Alternative therapies include oral appliance, or surgical assessment. - Avoid alcohol, sedatives and other CNS depressants that may worsen sleep apnea and disrupt normal sleep architecture. - Sleep hygiene should be reviewed to assess factors that may improve sleep quality. - Weight management and regular exercise should be initiated or continued if appropriate.  [Electronically signed] 10/06/2022 04:23 PM  Chesley Mires MD, ABSM Diplomate, American Board of Sleep Medicine NPI: [id number]  [place] PH: [phone]   FX: [phone] [place]

## 2022-10-08 NOTE — Telephone Encounter (Signed)
Spoke with patient regarding sleep study results. They verbalized understanding. No further questions. Scheduled an appt with BW NP to discuss treatment options  Nothing further needed at this time.

## 2022-10-15 DIAGNOSIS — R5381 Other malaise: Secondary | ICD-10-CM | POA: Insufficient documentation

## 2022-10-22 ENCOUNTER — Ambulatory Visit: Payer: Medicaid Other | Admitting: Primary Care

## 2022-10-22 ENCOUNTER — Encounter: Payer: Self-pay | Admitting: Primary Care

## 2022-10-22 ENCOUNTER — Telehealth: Payer: Self-pay | Admitting: *Deleted

## 2022-10-22 VITALS — BP 104/60 | HR 83 | Temp 97.9°F | Ht 62.0 in | Wt 239.0 lb

## 2022-10-22 DIAGNOSIS — J9611 Chronic respiratory failure with hypoxia: Secondary | ICD-10-CM

## 2022-10-22 DIAGNOSIS — G4733 Obstructive sleep apnea (adult) (pediatric): Secondary | ICD-10-CM | POA: Diagnosis not present

## 2022-10-22 NOTE — Telephone Encounter (Signed)
ATC Brad with [employer].  Left detailed message that we need a download for patient's bipap (patient in office currently).  Left fax # for TL office [phone].

## 2022-10-22 NOTE — Progress Notes (Signed)
$@[email or web address]$  ID: Brenda [name], female    DOB: [birth year], 62 y.o.   MRN: [id number]  Chief Complaint  Patient presents with   Follow-up    Referring provider: [place] Rakes, MD  HPI: 62 year old, former light smoker quit in 1988. PMH significant for OSA on BIPAP, chronic respiratory failure, heart failure, CAD, HTN, myocardial infarction, type 2 diabetes, hyperlipidemia, endometrial cancer, morbid obesity.   Previous [facility] pulmonary encounter: 01/18/22- Dr. Halford Chessman  She is here with her husband.  She had her sleep study years ago - not available for review.  She got a new PAP device last year.  She was told that her insurance would stop paying for her device, but she wasn't given an explanation as to why.  She uses CPAP nightly.  She has lost 70 lbs over the past year.  She still needs oxygen at night and sleep, but doesn't need when sitting during the day.   10/22/2022- Interim hx  Patient is here today to review sleep study results. Hx sleep apnea, old sleep study not available. She received new CPAP machine last year. She had PSG 09/30/22 showed mild OSA, AHI 12/hr with SpO2 low 79%. Spent 241 mins with O2 < 88%. She sleeps well at night. Typical bedtime 11pm. Falls asleep in 20 mins. She wakes up to use the restroom three times a night d.t lasix. She starts her day at 7:30am. Overall she feels rested when she wakes up in the morning. She does not require naps. Currently on PAP therapy without issues. She wears 2L oxygen 24/7. She recently changed from size medium full face mask to size small which seems to be working better. DME is [employer].   Allergies  Allergen Reactions   Hydrocodone Shortness Of Breath    "I forget to breathe."    Clopidogrel Rash   Clopidogrel Bisulfate Rash    Immunization History  Administered Date(s) Administered   Influenza,inj,Quad PF,6+ Mos 10/27/2019, 06/26/2021   Pneumococcal Polysaccharide-23 10/27/2019   Pneumococcal-Unspecified 05/04/2009     Past Medical History:  Diagnosis Date   Anemia    Arthritis    "back, hands, hips" (02/22/2015)   CAD (coronary artery disease)    a. complex LAD/diagonal bifurcation PCI in 2010. b. STEMI 10/2019 s/p PTCA/DES x1 to mLAD overlapping the old stent, residual disease treated medicaly.   Cancer [facility])    uterine   CHF (congestive heart failure) ([place])    Depression    Diabetes mellitus ([place])    "started when I was pregnant; not sure if it was type 1 or type 2"    History of radiation therapy    Endometrium- HDR 01/22/22-03/07/22- Dr. Gery Pray   Hypercholesterolemia    Hypertension    Morbid obesity ([place])    Myocardial infarction ([place])    mild x 3   Neuromuscular disorder ([place])    neuropathy feet   Sleep apnea    cpap/    Tobacco History: Social History   Tobacco Use  Smoking Status Former   Packs/day: 0.50   Years: 1.00   Total pack years: 0.50   Types: Cigarettes, Cigars   Quit date: 09/03/1986   Years since quitting: 36.1  Smokeless Tobacco Never   Counseling given: Not Answered   Outpatient Medications Prior to Visit  Medication Sig Dispense Refill   aspirin 81 MG EC tablet Take 1 tablet (81 mg total) by mouth daily. 30 tablet 11   atorvastatin (LIPITOR) 80 MG tablet Take 1 tablet (  80 mg total) by mouth every evening. 30 tablet 6   Continuous Blood Gluc Sensor (DEXCOM G7 SENSOR) MISC USE AS DIRECTED TO CHECK SUGAR CONTINUOUSLY     eszopiclone (LUNESTA) 1 MG TABS tablet Take 1 tablet (1 mg total) by mouth at bedtime as needed for sleep. Take immediately before bedtime 30 tablet 0   FLUoxetine (PROZAC) 20 MG capsule Take 1 capsule (20 mg total) by mouth daily. 30 capsule 6   furosemide (LASIX) 40 MG tablet Take 1 tablet by mouth once daily 30 tablet 0   insulin aspart (NOVOLOG) 100 UNIT/ML injection Inject 10-20 Units into the skin in the morning, at noon, and at bedtime. If BS<150=Take 10 units or If BS>150=Take 20 units     JARDIANCE 25 MG TABS tablet Take 25 mg  by mouth daily.     metFORMIN (GLUCOPHAGE-XR) 500 MG 24 hr tablet Take 500 mg by mouth 2 (two) times daily.     Misc. Devices MISC Portable oxygen concentrator, 2L oxygen.  Diagnoses-chronic respiratory failure with hypoxia 1 each 0   nitroGLYCERIN (NITROSTAT) 0.4 MG SL tablet Dissolve 1 tablet under the tongue every 5 minutes as needed for chest pain. Max of 3 doses, then 911. 25 tablet 6   prasugrel (EFFIENT) 5 MG TABS tablet Take 1 tablet (5 mg total) by mouth daily. 90 tablet 3   sacubitril-valsartan (ENTRESTO) 24-26 MG Take 1 tablet by mouth 2 (two) times daily. 180 tablet 3   Semaglutide, 1 MG/DOSE, (OZEMPIC, 1 MG/DOSE,) 4 MG/3ML SOPN Inject 65m every week subcutaneously 3 mL 3   No facility-administered medications prior to visit.      Review of Systems  Review of Systems  Constitutional: Negative.   HENT: Negative.    Respiratory: Negative.    Cardiovascular: Negative.     Physical Exam  BP 104/60 (BP Location: Right Arm, Patient Position: Sitting, Cuff Size: Large)   Pulse 83   Temp 97.9 F (36.6 C) (Oral)   Ht 5' 2"$  (1.575 m)   Wt 239 lb (108.4 kg)   LMP 10/30/2014 (Approximate)   SpO2 99%   BMI 43.71 kg/m  Physical Exam Constitutional:      Appearance: Normal appearance. She is obese.  HENT:     Head: Normocephalic and atraumatic.     Mouth/Throat:     Mouth: Mucous membranes are moist.     Pharynx: Oropharynx is clear.     Tonsils: 2+ on the right. 1+ on the left.  Cardiovascular:     Rate and Rhythm: Normal rate and regular rhythm.  Pulmonary:     Effort: Pulmonary effort is normal.     Breath sounds: Normal breath sounds. No wheezing, rhonchi or rales.  Musculoskeletal:        General: Normal range of motion.  Skin:    General: Skin is warm and dry.  Neurological:     General: No focal deficit present.     Mental Status: She is alert and oriented to person, place, and time. Mental status is at baseline.  Psychiatric:        Mood and Affect: Mood  normal.        Behavior: Behavior normal.        Thought Content: Thought content normal.        Judgment: Judgment normal.      Lab Results:  CBC    Component Value Date/Time   WBC 16.9 (H) 12/11/2021 0738   RBC 3.96 12/11/2021 0738   HGB  10.3 (L) 12/11/2021 0738   HGB 12.2 11/01/2021 1624   HCT 34.1 (L) 12/11/2021 0738   HCT 39.0 11/01/2021 1624   PLT 336 12/11/2021 0738   PLT 449 11/01/2021 1624   MCV 86.1 12/11/2021 0738   MCV 84 11/01/2021 1624   MCH 26.0 12/11/2021 0738   MCHC 30.2 12/11/2021 0738   RDW 16.9 (H) 12/11/2021 0738   RDW 18.0 (H) 11/01/2021 1624   LYMPHSABS 1.5 12/08/2021 1836   LYMPHSABS 2.3 11/01/2021 1624   MONOABS 0.9 12/08/2021 1836   EOSABS 0.2 12/08/2021 1836   EOSABS 0.2 11/01/2021 1624   BASOSABS 0.0 12/08/2021 1836   BASOSABS 0.1 11/01/2021 1624    BMET    Component Value Date/Time   NA 135 12/10/2021 0849   NA 141 10/04/2021 0839   K 4.0 12/10/2021 0849   CL 102 12/10/2021 0849   CO2 21 (L) 12/10/2021 0849   GLUCOSE 168 (H) 12/10/2021 0849   BUN 15 12/10/2021 0849   BUN 16 10/04/2021 0839   CREATININE 0.91 12/10/2021 0849   CREATININE 0.83 03/29/2016 1030   CALCIUM 8.3 (L) 12/10/2021 0849   GFRNONAA >60 12/10/2021 0849   GFRNONAA 81 03/29/2016 1030   GFRAA 94 12/16/2019 1041   GFRAA >89 03/29/2016 1030    BNP    Component Value Date/Time   BNP 73.1 10/18/2021 2000    ProBNP    Component Value Date/Time   PROBNP 103 10/04/2021 0839   PROBNP 68.0 01/17/2011 1847    Imaging: SLEEP STUDY DOCUMENTS  Result Date: 10/02/2022 Ordered by an unspecified provider.    Assessment & Plan:   OSA treated with BiPAP - Updated sleep study 09/30/22 showed mild OSA, AHI 12/hr with SpO2 low 79%. Patient spent 241 mins with O2 < 88%. She is already established on auto PAP and reports compliance with use. She received new machine last year. Sleeping well at night, no issues with mask fit or pressure settings. Requesting [employer] from [place]. No changes today. FU in 6 months or sooner if needed.   Chronic respiratory failure ([place]) - D/t obesity hypoventilation and heart failure - Continue 2L oxygen 24/7 with PAP therapy at night    Martyn Ehrich, NP 10/22/2022

## 2022-10-22 NOTE — Progress Notes (Signed)
Received flu vaccine in 2023 at PCP office.  Has had the first 2 Covid vaccines.

## 2022-10-22 NOTE — Patient Instructions (Addendum)
Sleep study in January 2024 reconfirmed dx mild obstructive sleep apnea, average 12 events an hour  Continue to wear auto CPAP every night with oxygen  Work on weight loss as able Focus on side sleeping position   Orders: Request download from [employer]   Follow-up: 6 months with Brenda Maize NP or sooner if needed   CPAP and BIPAP Information CPAP and BIPAP are methods that use air pressure to keep your airways open and to help you breathe well. CPAP and BIPAP use different amounts of pressure. Your health care provider will tell you whether CPAP or BIPAP would be more helpful for you. CPAP stands for "continuous positive airway pressure." With CPAP, the amount of pressure stays the same while you breathe in (inhale) and out (exhale). BIPAP stands for "bi-level positive airway pressure." With BIPAP, the amount of pressure will be higher when you inhale and lower when you exhale. This allows you to take larger breaths. CPAP or BIPAP may be used in the hospital, or your health care provider may want you to use it at home. You may need to have a sleep study before your health care provider can order a machine for you to use at home. What are the advantages? CPAP or BIPAP can be helpful if you have: Sleep apnea. Chronic obstructive pulmonary disease (COPD). Heart failure. Medical conditions that cause muscle weakness, including muscular dystrophy or amyotrophic lateral sclerosis (ALS). Other problems that cause breathing to be shallow, weak, abnormal, or difficult. CPAP and BIPAP are most commonly used for obstructive sleep apnea (OSA) to keep the airways from collapsing when the muscles relax during sleep. What are the risks? Generally, this is a safe treatment. However, problems may occur, including: Irritated skin or skin sores if the mask does not fit properly. Dry or stuffy nose or nosebleeds. Dry mouth. Feeling gassy or bloated. Sinus or lung infection if the equipment is not cleaned  properly. When should CPAP or BIPAP be used? In most cases, the mask only needs to be worn during sleep. Generally, the mask needs to be worn throughout the night and during any daytime naps. People with certain medical conditions may also need to wear the mask at other times, such as when they are awake. Follow instructions from your health care provider about when to use the machine. What happens during CPAP or BIPAP?  Both CPAP and BIPAP are provided by a small machine with a flexible plastic tube that attaches to a plastic mask that you wear. Air is blown through the mask into your nose or mouth. The amount of pressure that is used to blow the air can be adjusted on the machine. Your health care provider will set the pressure setting and help you find the best mask for you. Tips for using the mask Because the mask needs to be snug, some people feel trapped or closed-in (claustrophobic) when first using the mask. If you feel this way, you may need to get used to the mask. One way to do this is to hold the mask loosely over your nose or mouth and then gradually apply the mask more snugly. You can also gradually increase the amount of time that you use the mask. Masks are available in various types and sizes. If your mask does not fit well, talk with your health care provider about getting a different one. Some common types of masks include: Full face masks, which fit over the mouth and nose. Nasal masks, which fit over the  nose. Nasal pillow or prong masks, which fit into the nostrils. If you are using a mask that fits over your nose and you tend to breathe through your mouth, a chin strap may be applied to help keep your mouth closed. Use a skin barrier to protect your skin as told by your health care provider. Some CPAP and BIPAP machines have alarms that may sound if the mask comes off or develops a leak. If you have trouble with the mask, it is very important that you talk with your health care  provider about finding a way to make the mask easier to tolerate. Do not stop using the mask. There could be a negative impact on your health if you stop using the mask. Tips for using the machine Place your CPAP or BIPAP machine on a secure table or stand near an electrical outlet. Know where the on/off switch is on the machine. Follow instructions from your health care provider about how to set the pressure on your machine and when you should use it. Do not eat or drink while the CPAP or BIPAP machine is on. Food or fluids could get pushed into your lungs by the pressure of the CPAP or BIPAP. For home use, CPAP and BIPAP machines can be rented or purchased through home health care companies. Many different brands of machines are available. Renting a machine before purchasing may help you find out which particular machine works well for you. Your health insurance company may also decide which machine you may get. Keep the CPAP or BIPAP machine and attachments clean. Ask your health care provider for specific instructions. Check the humidifier if you have a dry stuffy nose or nosebleeds. Make sure it is working correctly. Follow these instructions at home: Take over-the-counter and prescription medicines only as told by your health care provider. Ask if you can take sinus medicine if your sinuses are blocked. Do not use any products that contain nicotine or tobacco. These products include cigarettes, chewing tobacco, and vaping devices, such as e-cigarettes. If you need help quitting, ask your health care provider. Keep all follow-up visits. This is important. Contact a health care provider if: You have redness or pressure sores on your head, face, mouth, or nose from the mask or head gear. You have trouble using the CPAP or BIPAP machine. You cannot tolerate wearing the CPAP or BIPAP mask. Someone tells you that you snore even when wearing your CPAP or BIPAP. Get help right away if: You have  trouble breathing. You feel confused. Summary CPAP and BIPAP are methods that use air pressure to keep your airways open and to help you breathe well. If you have trouble with the mask, it is very important that you talk with your health care provider about finding a way to make the mask easier to tolerate. Do not stop using the mask. There could be a negative impact to your health if you stop using the mask. Follow instructions from your health care provider about when to use the machine. This information is not intended to replace advice given to you by your health care provider. Make sure you discuss any questions you have with your health care provider. Document Revised: 03/29/2021 Document Reviewed: 07/29/2020 Elsevier Patient Education  [place].

## 2022-10-22 NOTE — Assessment & Plan Note (Addendum)
-   Updated sleep study 09/30/22 showed mild OSA, AHI 12/hr with SpO2 low 79%. Patient spent 241 mins with O2 < 88%. She is already established on auto PAP and reports compliance with use. She received new machine last year. Sleeping well at night, no issues with mask fit or pressure settings. Requesting [employer] from [place]. No changes today. FU in 6 months or sooner if needed.

## 2022-10-22 NOTE — Assessment & Plan Note (Signed)
-   D/t obesity hypoventilation and heart failure - Continue 2L oxygen 24/7 with PAP therapy at night

## 2022-10-23 NOTE — Progress Notes (Signed)
Reviewed and agree with assessment/plan.   Chesley Mires, MD [facility] Pulmonary/Critical Care 10/23/2022, 8:09 AM Pager:  [phone]

## 2022-10-30 DIAGNOSIS — Z719 Counseling, unspecified: Secondary | ICD-10-CM | POA: Insufficient documentation

## 2022-11-16 ENCOUNTER — Other Ambulatory Visit: Payer: Self-pay | Admitting: Nurse Practitioner

## 2022-11-16 DIAGNOSIS — Z23 Encounter for immunization: Secondary | ICD-10-CM | POA: Insufficient documentation

## 2022-11-16 DIAGNOSIS — H6121 Impacted cerumen, right ear: Secondary | ICD-10-CM | POA: Insufficient documentation

## 2022-11-16 DIAGNOSIS — Z7689 Persons encountering health services in other specified circumstances: Secondary | ICD-10-CM

## 2022-12-06 ENCOUNTER — Telehealth: Payer: Self-pay

## 2022-12-06 NOTE — Telephone Encounter (Signed)
**Note De-Identified [name] Obfuscation** Brenda [name] (Key: [id number]) Outcome Available without authorization. Drug Entresto 24-26MG  tablets ePA cloud logo Form [facility] [place] Electronic [place] Form [phone] NCPDP)  I have notified [facility] [place] (Ph: [phone] of this approval and I did request that they fill the RX and to notify the pt when ready for pick up.

## 2022-12-13 ENCOUNTER — Ambulatory Visit
Admission: RE | Admit: 2022-12-13 | Discharge: 2022-12-13 | Disposition: A | Discharge status: Home / Self Care | Payer: Medicaid Other | Source: Ambulatory Visit | Attending: Nurse Practitioner | Admitting: Nurse Practitioner

## 2022-12-13 DIAGNOSIS — Z7689 Persons encountering health services in other specified circumstances: Secondary | ICD-10-CM

## 2022-12-14 DIAGNOSIS — E041 Nontoxic single thyroid nodule: Secondary | ICD-10-CM | POA: Insufficient documentation

## 2022-12-25 ENCOUNTER — Encounter: Payer: Self-pay | Admitting: Gynecologic Oncology

## 2022-12-28 ENCOUNTER — Other Ambulatory Visit: Payer: Self-pay

## 2022-12-28 ENCOUNTER — Encounter: Payer: Self-pay | Admitting: Gynecologic Oncology

## 2022-12-28 ENCOUNTER — Inpatient Hospital Stay: Payer: Medicaid Other | Attending: Gynecologic Oncology | Admitting: Gynecologic Oncology

## 2022-12-28 VITALS — BP 134/55 | HR 64 | Temp 97.8°F | Resp 20 | Ht 62.0 in | Wt 224.0 lb

## 2022-12-28 DIAGNOSIS — M25551 Pain in right hip: Secondary | ICD-10-CM | POA: Diagnosis not present

## 2022-12-28 DIAGNOSIS — Z90710 Acquired absence of both cervix and uterus: Secondary | ICD-10-CM | POA: Diagnosis not present

## 2022-12-28 DIAGNOSIS — Z08 Encounter for follow-up examination after completed treatment for malignant neoplasm: Secondary | ICD-10-CM | POA: Insufficient documentation

## 2022-12-28 DIAGNOSIS — Z90722 Acquired absence of ovaries, bilateral: Secondary | ICD-10-CM | POA: Diagnosis not present

## 2022-12-28 DIAGNOSIS — C541 Malignant neoplasm of endometrium: Secondary | ICD-10-CM

## 2022-12-28 DIAGNOSIS — Z8542 Personal history of malignant neoplasm of other parts of uterus: Secondary | ICD-10-CM | POA: Diagnosis present

## 2022-12-28 NOTE — Patient Instructions (Signed)
It was good to see you today.  I do not see or feel any evidence of cancer recurrence on your exam.  I will see you for follow-up in 6 months.  As always, if you develop any new and concerning symptoms before your next visit, please call to see me sooner.   

## 2022-12-28 NOTE — Progress Notes (Signed)
Gynecologic Oncology Return Clinic Visit  12/28/22  Reason for Visit: surveillance in the setting of endometrial cancer  Treatment History: Oncology History Overview Note  MMR IHC: loss of MLH1 and PMS2 MSI - high MLH1 hyper methylation present  Tumor size 6 cm   Endometrial adenocarcinoma (HCC)  10/26/2021 Initial Biopsy   EMB: grade 1 endometrioid adenocarcinoma   11/21/2021 Imaging   CT A/P: 1. No evidence for metastatic disease in the abdomen or pelvis. 2. Endometrium appears thickened, but not well demonstrated by CT. Endometrial stripe appears to be 4.7 cm in thickness on sagittal CT imaging today. 3. Complex paraumbilical hernia contains only fat. 4. Skin thickening with soft tissue stranding noted anterior lower abdominal wall. This could be related to cellulitis and should be amenable to clinical inspection. 5. Advanced coronary artery calcification in the left coronary distribution.   12/06/2021 Initial Diagnosis   Endometrial adenocarcinoma (HCC)   12/06/2021 Surgery   Robotic-assisted laparoscopic total hysterectomy with bilateral salpingoophorectomy, laparoscopic hernia reduction Findings: On EUA, 12 cm mobile uterus.  On intra-abdominal entry, normal appearing upper abdominal survey although right liver edge and diaphragm obscured secondary to adhesions from cholecystectomy.  Normal-appearing omentum with portion of the omentum within a 3 cm umbilical hernia (reduced).  Normal-appearing small and large bowel.  Normal-appearing bilateral fallopian tubes and ovaries.  Uterus 12 cm in quite bulbous.  No obvious adenopathy.  No ascites.  No clear evidence of intra-abdominal or pelvic disease.   12/13/2021 Pathology Results   A. UTERUS, CERVIX, TUBES AND OVARIES:  - Endometrial adenocarcinoma with foci of squamous differentiation, FIGO  grade 1  - Carcinoma invades for a depth of 1.6 cm where myometrial thickness is  2.8 cm ( 60%)  - Carcinoma does not involve the cervix   - Benign bilateral fallopian tubes and ovaries  - See oncology table   ONCOLOGY TABLE:   UTERUS, CARCINOMA OR CARCINOSARCOMA: Resection   Procedure: Total hysterectomy and bilateral salpingo-oophorectomy  Histologic Type: Endometrioid carcinoma with foci of squamous  differentiation  Histologic Grade: FIGO grade 1  Myometrial Invasion:       Depth of Myometrial Invasion (mm): 16 mm       Myometrial Thickness (mm): 28 mm       Percentage of Myometrial Invasion: 57%  Uterine Serosa Involvement: Not identified  Cervical stromal Involvement: Not identified  Extent of involvement of other tissue/organs: Not identified  Peritoneal/Ascitic Fluid: Not applicable  Lymphovascular Invasion: Not identified  Regional Lymph Nodes: Not applicable (no lymph nodes submitted or found)        Pelvic Lymph Nodes Examined:                                   0 Sentinel                                   0 Non-sentinel                                   0 Total       Pelvic Lymph Nodes with Metastasis: Not applicable       Para-aortic Lymph Nodes Examined:  0 Sentinel                                    0 non-sentinel                                    0 total       Para-aortic Lymph Nodes with Metastasis: Not applicable  Distant Metastasis:       Distant Site(s) Involved: Not applicable  Pathologic Stage Classification (pTNM, AJCC 8th Edition): pT1b, pN not  assigned  Ancillary Studies: MMR / MSI testing will be ordered  Representative Tumor Block: A3    01/22/2022 - 03/07/2022 Radiation Therapy   01/22/2022 through 03/07/2022 Site Technique Total Dose (Gy) Dose per Fx (Gy) Completed Fx Beam Energies  Vagina: Pelvis HDR-brachy 30/30 6 5/5 [id number]         Interval History: The patient reports overall doing well.  She denies any abdominal or pelvic pain.  Denies any vaginal bleeding or discharge.  Has been using her dilator regularly.  Reports baseline bowel function  without any significant change.  Denies urinary symptoms.  Notes that she has been having some intermittent right back and hip pain.  This can last for 1 or 2 days, responds to Tylenol.  She describes the pain as achy, not sharp.  In the last month, she thinks she experienced this 7 to 10 days.  Past Medical/Surgical History: Past Medical History:  Diagnosis Date   Anemia    Arthritis    "back, hands, hips" (02/22/2015)   CAD (coronary artery disease)    a. complex LAD/diagonal bifurcation PCI in 2010. b. STEMI 10/2019 s/p PTCA/DES x1 to mLAD overlapping the old stent, residual disease treated medicaly.   Cancer [facility])    uterine   CHF (congestive heart failure) (HCC)    Depression    Diabetes mellitus (HCC)    "started when I was pregnant; not sure if it was type 1 or type 2"    History of radiation therapy    Endometrium- HDR 01/22/22-03/07/22- Dr. Antony Blackbird   Hypercholesterolemia    Hypertension    Morbid obesity (HCC)    Myocardial infarction (HCC)    mild x 3   Neuromuscular disorder (HCC)    neuropathy feet   Sleep apnea    cpap/    Past Surgical History:  Procedure Laterality Date   CARDIAC CATHETERIZATION  12/02/2012   CHOLECYSTECTOMY OPEN  09/04/1987   CORONARY ANGIOPLASTY WITH STENT PLACEMENT  09/03/2009   CORONARY/GRAFT ACUTE MI REVASCULARIZATION N/A 10/26/2019   Procedure: CORONARY/GRAFT ACUTE MI REVASCULARIZATION;  Surgeon: Kathleene Hazel, MD;  Location: [facility] INVASIVE CV LAB;  Service: Cardiovascular;  Laterality: N/A;   INCISIONAL HERNIA REPAIR N/A 12/09/2021   Procedure: LAPAROSCOPIC REPAIR UMBILICAL HERNIA WITH MESH;  Surgeon: Abigail Miyamoto, MD;  Location: [facility] ORS;  Service: General;  Laterality: N/A;   LEFT HEART CATH AND CORONARY ANGIOGRAPHY N/A 10/26/2019   Procedure: LEFT HEART CATH AND CORONARY ANGIOGRAPHY;  Surgeon: Kathleene Hazel, MD;  Location: [facility] INVASIVE CV LAB;  Service: Cardiovascular;  Laterality: N/A;   LEFT HEART  CATHETERIZATION WITH CORONARY ANGIOGRAM N/A 12/25/2012   Procedure: LEFT HEART CATHETERIZATION WITH CORONARY ANGIOGRAM;  Surgeon: Tonny Bollman, MD;  Location: [facility] CATH LAB;  Service: Cardiovascular;  Laterality: N/A;   ROBOTIC ASSISTED TOTAL HYSTERECTOMY WITH BILATERAL  SALPINGO OOPHERECTOMY N/A 12/06/2021   Procedure: XI ROBOTIC ASSISTED TOTAL HYSTERECTOMY WITH BILATERAL SALPINGO OOPHORECTOMY;  Surgeon: Carver Fila, MD;  Location: [facility] ORS;  Service: Gynecology;  Laterality: N/A;   UMBILICAL HERNIA REPAIR  05/04/1989   doesn't think they used mesh    Family History  Problem Relation Age of Onset   Coronary artery disease Mother        in her mid 86s   Hypertension Mother    Cancer Mother        skin   Heart attack Father        in his mid 62s   COPD Father    Stroke Sister    Coronary artery disease Sister    Diabetes Sister    Other Half-Sister    Thyroid cancer Daughter    Prostate cancer Neg Hx    Colon cancer Neg Hx    Breast cancer Neg Hx    Endometrial cancer Neg Hx    Ovarian cancer Neg Hx    Pancreatic cancer Neg Hx     Social History   Socioeconomic History   Marital status: Married    Spouse name: Not on file   Number of children: 2   Years of education: Not on file   Highest education level: Not on file  Occupational History   Occupation: Housewife  Tobacco Use   Smoking status: Former    Packs/day: 0.50    Years: 1.00    Additional pack years: 0.00    Total pack years: 0.50    Types: Cigarettes, Cigars    Quit date: 09/03/1986    Years since quitting: 36.3   Smokeless tobacco: Never  Vaping Use   Vaping Use: Never used  Substance and Sexual Activity   Alcohol use: No   Drug use: No   Sexual activity: Not Currently  Other Topics Concern   Not on file  Social History Narrative   Not on file   Social Determinants of Health   Financial Resource Strain: High Risk (02/10/2021)   Overall Financial Resource Strain (CARDIA)    Difficulty of  Paying Living Expenses: Hard  Food Insecurity: Food Insecurity Present (02/10/2021)   Hunger Vital Sign    Worried About Running Out of Food in the Last Year: Sometimes true    Ran Out of Food in the Last Year: Sometimes true  Transportation Needs: No Transportation Needs (02/10/2021)   PRAPARE - Administrator, Civil Service (Medical): No    Lack of Transportation (Non-Medical): No  Physical Activity: Not on file  Stress: Not on file  Social Connections: Not on file    Current Medications:  Current Outpatient Medications:    aspirin 81 MG EC tablet, Take 1 tablet (81 mg total) by mouth daily., Disp: 30 tablet, Rfl: 11   atorvastatin (LIPITOR) 80 MG tablet, Take 1 tablet (80 mg total) by mouth every evening., Disp: 30 tablet, Rfl: 6   FIASP FLEXTOUCH 100 UNIT/ML FlexTouch Pen, Inject into the skin., Disp: , Rfl:    FLUoxetine (PROZAC) 20 MG capsule, Take 1 capsule (20 mg total) by mouth daily., Disp: 30 capsule, Rfl: 6   furosemide (LASIX) 40 MG tablet, Take 1 tablet by mouth once daily, Disp: 30 tablet, Rfl: 0   insulin aspart (NOVOLOG) 100 UNIT/ML injection, Inject 10-20 Units into the skin in the morning, at noon, and at bedtime. If BS<150=Take 10 units or If BS>150=Take 20 units, Disp: , Rfl:    Insulin  Degludec FlexTouch 100 UNIT/ML SOPN, Inject into the skin., Disp: , Rfl:    insulin glargine-yfgn (SEMGLEE) 100 UNIT/ML Pen, SMARTSIG:45 Unit(s) SUB-Q Daily, Disp: , Rfl:    JARDIANCE 25 MG TABS tablet, Take 25 mg by mouth daily., Disp: , Rfl:    metFORMIN (GLUCOPHAGE-XR) 500 MG 24 hr tablet, Take 500 mg by mouth 2 (two) times daily., Disp: , Rfl:    Misc. Devices MISC, Portable oxygen concentrator, 2L oxygen.  Diagnoses-chronic respiratory failure with hypoxia, Disp: 1 each, Rfl: 0   nitroGLYCERIN (NITROSTAT) 0.4 MG SL tablet, Dissolve 1 tablet under the tongue every 5 minutes as needed for chest pain. Max of 3 doses, then 911., Disp: 25 tablet, Rfl: 6   OZEMPIC, 2 MG/DOSE,  8 MG/3ML SOPN, Inject into the skin., Disp: , Rfl:    prasugrel (EFFIENT) 5 MG TABS tablet, Take 1 tablet (5 mg total) by mouth daily., Disp: 90 tablet, Rfl: 3   pregabalin (LYRICA) 100 MG capsule, Take 100 mg by mouth 3 (three) times daily., Disp: , Rfl:    sacubitril-valsartan (ENTRESTO) 24-26 MG, Take 1 tablet by mouth 2 (two) times daily., Disp: 180 tablet, Rfl: 3   Semaglutide, 1 MG/DOSE, (OZEMPIC, 1 MG/DOSE,) 4 MG/3ML SOPN, Inject 1mg  every week subcutaneously, Disp: 3 mL, Rfl: 3   Continuous Blood Gluc Sensor (DEXCOM G7 SENSOR) MISC, USE AS DIRECTED TO CHECK SUGAR CONTINUOUSLY, Disp: , Rfl:    eszopiclone (LUNESTA) 1 MG TABS tablet, Take 1 tablet (1 mg total) by mouth at bedtime as needed for sleep. Take immediately before bedtime, Disp: 30 tablet, Rfl: 0  Review of Systems: + hot flashes Denies appetite changes, fevers, chills, fatigue, unexplained weight changes. Denies hearing loss, neck lumps or masses, mouth sores, ringing in ears or voice changes. Denies cough or wheezing.  Denies shortness of breath. Denies chest pain or palpitations. Denies leg swelling. Denies abdominal distention, pain, blood in stools, constipation, diarrhea, nausea, vomiting, or early satiety. Denies pain with intercourse, dysuria, frequency, hematuria or incontinence. Denies pelvic pain, vaginal bleeding or vaginal discharge.   Denies joint pain, back pain or muscle pain/cramps. Denies itching, rash, or wounds. Denies dizziness, headaches, numbness or seizures. Denies swollen lymph nodes or glands, denies easy bruising or bleeding. Denies anxiety, depression, confusion, or decreased concentration.  Physical Exam: BP (!) 134/55 (BP Location: Right Arm)   Pulse 64   Temp 97.8 F (36.6 C) (Oral)   Resp 20   Ht 5\' 2"  (1.575 m)   Wt 224 lb (101.6 kg)   LMP 10/30/2014 (Approximate)   BMI 40.97 kg/m  General: Alert, oriented, no acute distress. HEENT: Normocephalic, atraumatic, sclera  anicteric. Chest: Distant breath sounds but clear to auscultation bilaterally.  Unlabored breathing on oxygen by nasal cannula. Cardiovascular: Regular rate and rhythm, no murmurs. Abdomen: Obese, soft, nontender.  Normoactive bowel sounds.  No masses or hepatosplenomegaly appreciated.  Well-healed incisions.  Some induration of her pannus without any evidence of erythema. Extremities: Grossly normal range of motion.  Warm, well perfused.  Trace edema bilaterally. Back: No CVA tenderness bilaterally.  Unable to reproduce pain with palpation at the hip joint. Skin: No rashes or lesions noted. Lymphatics: No cervical, supraclavicular, or inguinal adenopathy. GU: Normal appearing external genitalia without erythema, excoriation, or lesions.  Speculum exam reveals no blood or discharge, cuff intact, minimal radiation changes, no visible lesions.  Bimanual exam reveals cuff intact, no nodularity or masses.  Rectovaginal exam confirms findings.  Laboratory & Radiologic Studies: None new  Assessment &  Plan: Brenda [name] is a 62 y.o. 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 No LVI. MSI-high. MMR IHC with loss of MLH1 and PMS2.  MLH1 promoter hypermethylation present. Discussed options for adjuvant treatment.  Ultimately elected for treated with adjuvant VBT, completed in 03/2022. Presents for surveillance.   Overall doing well.  She is NED on exam today.  Patient was congratulated on her continued weight loss.  She has lost approximately 40 pounds since November.   Per NCCN surveillance recommendations, discussed plan for surveillance visits every 3 months alternating between my office and radiation oncology.  She will see Dr. Roselind Messier in July and sees me again in October.  We discussed signs and symptoms that would be concerning for cancer recurrence, and I stressed the importance of calling if she develops any of these.   We  discussed her pain which I suspect is related to right hip pain.  I suggested that in addition to Tylenol, she could try an NSAID like ibuprofen.  We also discussed continued exercises, heat and cold therapy.  22 minutes of total time was spent for this patient encounter, including preparation, face-to-face counseling with the patient and coordination of care, and documentation of the encounter.  Eugene Garnet, MD  Division of Gynecologic Oncology  Department of Obstetrics and Gynecology  [facility]

## 2023-01-24 NOTE — Progress Notes (Addendum)
Cardiology Office Note:    Date:  01/25/2023  ID:  Brenda [name], DOB [birth year], MRN [id number] PCP: Hoy Register, MD  [facility] Providers Cardiologist:  Tonny Bollman, MD        Patient Profile:   Coronary artery disease  S/p non-STEMI in 2000 tx w/ complex bifurcational LAD/Dx stenting Allergic to Plavix Anterior STEMI 10/2019 >> s/p 2.5 x 28 mm DES to mid LAD (overlapping with old stent); chronic ost Dx stenosis (jailed by stent)  LHC 10/26/2019: p LAD stent patent, m LAD 100 thrombotic, D1 stent 80 ISR, 70; p LCx 30 Myoview 11/29/21: Low risk, EF 71 (HFpEF) heart failure with preserved ejection fraction TTE 10/26/2019: EF 60-65, no RWMA, mild conc LVH, Gr 1 DD, normal RVSF, mild LAE TTE 02/08/2021: EF 60-65, no RWMA, moderately reduced RVSF RV Failure  Chronic respiratory failure Chronic O2 Diabetes mellitus, insulin dependent  OSA  Hypertension  Hyperlipidemia  Morbid obesity  Endometrial CA s/p Hysterectomy w BSO Admx with SBO 12/2021      History of Present Illness:   Brenda [name] is a 62 y.o. female who returns for follow-up of CAD, CHF.  She was last seen by Dr. Excell Seltzer 07/16/2022.  She is here alone.  She is on chronic O2.  She notes that her breathing is stable.  She has not had chest discomfort.  She has not had orthopnea, leg edema or syncope.  Review of Systems  Hematologic/Lymphatic: Does not bruise/bleed easily.  See the HPI    Studies Reviewed:    EKG: NSR, HR 76, inferior Q waves, right bundle branch block, anterolateral Q waves, QTc 470, similar to prior tracing  Risk Assessment/Calculations:           Physical Exam:   VS:  BP 118/60   Pulse 76   Ht 5\' 2"  (1.575 m)   Wt 227 lb (103 kg)   LMP 10/30/2014 (Approximate)   SpO2 96%   BMI 41.52 kg/m    Wt Readings from Last 3 Encounters:  01/25/23 227 lb (103 kg)  12/28/22 224 lb (101.6 kg)  10/22/22 239 lb (108.4 kg)    Constitutional:      Appearance: Healthy appearance. Not  in distress.  Neck:     Vascular: No carotid bruit.  Pulmonary:     Breath sounds: No wheezing. No rales.  Cardiovascular:     Normal rate. Regular rhythm.     Murmurs: There is no murmur.  Edema:    Peripheral edema absent.      ASSESSMENT AND PLAN:   (HFpEF) heart failure with preserved ejection fraction (HCC) NYHA II.  Volume status stable.  Continue Jardiance 25 mg daily, Lasix 40 mg daily, Entresto 24/26 mg twice daily.  Obtain follow-up CMET today.  Follow-up 6 months.  CAD (coronary artery disease) History of non-STEMI in 2000 and treated with complex bifurcational LAD/diagonal stenting and subsequent anterior STEMI in February 2021 treated with DES overlapping with the previous stent.  Myoview in March 2023 was low risk.  She is doing well without chest discomfort to suggest angina.  Continue aspirin 81 mg daily, Effient 5 mg daily, Lipitor 80 mg daily.  Obtain follow-up CMET, CBC today.  Follow-up 6 months.  Essential hypertension Blood pressure is well-controlled.  Continue Entresto 24/26 mg twice daily.  Hyperlipidemia associated with type 2 diabetes mellitus (HCC) Continue Lipitor 80 mg daily.  Goal LDL <55.  Obtain follow-up CMET, lipids today.    Dispo:  Return in  about 6 months (around 07/28/2023) for Routine Follow Up, w/ Dr. Excell Seltzer.  Signed, Tereso Newcomer, PA-C

## 2023-01-25 ENCOUNTER — Ambulatory Visit: Payer: Medicaid Other | Attending: Physician Assistant | Admitting: Physician Assistant

## 2023-01-25 ENCOUNTER — Encounter: Payer: Self-pay | Admitting: Physician Assistant

## 2023-01-25 VITALS — BP 118/60 | HR 76 | Ht 62.0 in | Wt 227.0 lb

## 2023-01-25 DIAGNOSIS — I10 Essential (primary) hypertension: Secondary | ICD-10-CM | POA: Diagnosis not present

## 2023-01-25 DIAGNOSIS — I5032 Chronic diastolic (congestive) heart failure: Secondary | ICD-10-CM

## 2023-01-25 DIAGNOSIS — Z7984 Long term (current) use of oral hypoglycemic drugs: Secondary | ICD-10-CM

## 2023-01-25 DIAGNOSIS — E1169 Type 2 diabetes mellitus with other specified complication: Secondary | ICD-10-CM | POA: Diagnosis not present

## 2023-01-25 DIAGNOSIS — Z9861 Coronary angioplasty status: Secondary | ICD-10-CM

## 2023-01-25 DIAGNOSIS — E785 Hyperlipidemia, unspecified: Secondary | ICD-10-CM

## 2023-01-25 DIAGNOSIS — I25118 Atherosclerotic heart disease of native coronary artery with other forms of angina pectoris: Secondary | ICD-10-CM

## 2023-01-25 DIAGNOSIS — I2510 Atherosclerotic heart disease of native coronary artery without angina pectoris: Secondary | ICD-10-CM

## 2023-01-25 MED ORDER — NITROGLYCERIN 0.4 MG SL SUBL
SUBLINGUAL_TABLET | SUBLINGUAL | 6 refills | Status: AC
Start: 2023-01-25 — End: ?

## 2023-01-25 MED ORDER — ENTRESTO 24-26 MG PO TABS
1.0000 | ORAL_TABLET | Freq: Two times a day (BID) | ORAL | 3 refills | Status: DC
Start: 2023-01-25 — End: 2023-07-19

## 2023-01-25 MED ORDER — PRASUGREL HCL 5 MG PO TABS
5.0000 mg | ORAL_TABLET | Freq: Every day | ORAL | 3 refills | Status: AC
Start: 2023-01-25 — End: 2024-01-25

## 2023-01-25 NOTE — Assessment & Plan Note (Addendum)
History of non-STEMI in 2000 and treated with complex bifurcational LAD/diagonal stenting and subsequent anterior STEMI in February 2021 treated with DES overlapping with the previous stent.  Myoview in March 2023 was low risk.  She is doing well without chest discomfort to suggest angina.  Continue aspirin 81 mg daily, Effient 5 mg daily, Lipitor 80 mg daily.  Obtain follow-up CMET, CBC today.  Follow-up 6 months.

## 2023-01-25 NOTE — Assessment & Plan Note (Signed)
NYHA II.  Volume status stable.  Continue Jardiance 25 mg daily, Lasix 40 mg daily, Entresto 24/26 mg twice daily.  Obtain follow-up CMET today.  Follow-up 6 months.

## 2023-01-25 NOTE — Patient Instructions (Signed)
Medication Instructions:  Your physician recommends that you continue on your current medications as directed. Please refer to the Current Medication list given to you today.  *If you need a refill on your cardiac medications before your next appointment, please call your pharmacy*   Lab Work: BMET, CBC, Lipids If you have labs (blood work) drawn today and your tests are completely normal, you will receive your results only by: MyChart Message (if you have MyChart) OR A paper copy in the mail If you have any lab test that is abnormal or we need to change your treatment, we will call you to review the results.   Follow-Up: At [facility], you and your health needs are our priority.  As part of our continuing mission to provide you with exceptional heart care, we have created designated Provider Care Teams.  These Care Teams include your primary Cardiologist (physician) and Advanced Practice Providers (APPs -  Physician Assistants and Nurse Practitioners) who all work together to provide you with the care you need, when you need it.   Your next appointment:   6 month(s)  Provider:   Tonny Bollman, MD

## 2023-01-25 NOTE — Assessment & Plan Note (Signed)
Blood pressure is well-controlled.  Continue Entresto 24/26 mg twice daily.

## 2023-01-25 NOTE — Assessment & Plan Note (Signed)
Continue Lipitor 80 mg daily.  Goal LDL <55.  Obtain follow-up CMET, lipids today.

## 2023-01-26 LAB — COMPREHENSIVE METABOLIC PANEL
ALT: 16 IU/L (ref 0–32)
AST: 10 IU/L (ref 0–40)
Albumin/Globulin Ratio: 1.5 (ref 1.2–2.2)
Albumin: 4 g/dL (ref 3.8–4.9)
Alkaline Phosphatase: 98 IU/L (ref 44–121)
BUN/Creatinine Ratio: 19 (ref 9–23)
BUN: 13 mg/dL (ref 6–24)
Bilirubin Total: 0.4 mg/dL (ref 0.0–1.2)
CO2: 21 mmol/L (ref 20–29)
Calcium: 9.5 mg/dL (ref 8.7–10.2)
Chloride: 101 mmol/L (ref 96–106)
Creatinine, Ser: 0.7 mg/dL (ref 0.57–1.00)
Globulin, Total: 2.6 g/dL (ref 1.5–4.5)
Glucose: 180 mg/dL — ABNORMAL HIGH (ref 70–99)
Potassium: 4.3 mmol/L (ref 3.5–5.2)
Sodium: 138 mmol/L (ref 134–144)
Total Protein: 6.6 g/dL (ref 6.0–8.5)
eGFR: 100 mL/min/{1.73_m2} (ref >59)

## 2023-01-26 LAB — CBC
Hematocrit: 39.2 % (ref 34.0–46.6)
Hemoglobin: 13.2 g/dL (ref 11.1–15.9)
MCH: 28.8 pg (ref 26.6–33.0)
MCHC: 33.7 g/dL (ref 31.5–35.7)
MCV: 86 fL (ref 79–97)
Platelets: 293 10*3/uL (ref 150–450)
RBC: 4.58 x10E6/uL (ref 3.77–5.28)
RDW: 13.6 % (ref 11.7–15.4)
WBC: 11.3 10*3/uL — ABNORMAL HIGH (ref 3.4–10.8)

## 2023-01-26 LAB — LIPID PANEL
Chol/HDL Ratio: 2.4 ratio (ref 0.0–4.4)
Cholesterol, Total: 108 mg/dL (ref 100–199)
HDL: 45 mg/dL (ref >39)
LDL Chol Calc (NIH): 40 mg/dL (ref 0–99)
Triglycerides: 130 mg/dL (ref 0–149)
VLDL Cholesterol Cal: 23 mg/dL (ref 5–40)

## 2023-02-15 DIAGNOSIS — B372 Candidiasis of skin and nail: Secondary | ICD-10-CM | POA: Insufficient documentation

## 2023-03-31 NOTE — Progress Notes (Signed)
Radiation Oncology         [phone]) [phone] ________________________________  Name: Brenda [name] MRN: [id number]  Date: 04/01/2023  DOB: 22-Jan-1961  Follow-Up Visit Note  CC: Hoy Register, MD  Hoy Register, MD  No diagnosis found.  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 She was lasts seen here for follow-up on 09/10/22. Since her last visit, the patient followed up with Dr. Pricilla Holm on 12/28/22. During which time, the patient denied any symptoms concerning for disease recurrence and she was noted as NED on examination. She did endorse some intermittent right back and right hip pain and was advised to use an NSAID in addition to tylenol to manage.       Of note: The patient presented for thyroid ultrasound on 12/13/22 in the setting of her family history of thyroid cancer. US findings noted an enlarged thyroid gland and an ill defined 1 cm nodule in the right lower thyroid gland which meets imaging criteria for surveillance.   The patient was recently seen by Dr. Aleene Davidson at [facility] for further evaluation of the right thyroid nodule noted on her recent US. Physical exam performed at that time was overall unremarkable she she denied any concerning associated symptoms (or symptoms overall) related to the nodule. In light of her negative exam and symptom review, Dr. Aleene Davidson recommends proceeding with repeat imaging in 6 months to re-assess ultrasound findings.           ***     Allergies:  is  allergic to hydrocodone, clopidogrel, and clopidogrel bisulfate.  Meds: Current Outpatient Medications  Medication Sig Dispense Refill   aspirin 81 MG EC tablet Take 1 tablet (81 mg total) by mouth daily. 30 tablet 11   atorvastatin (LIPITOR) 80 MG tablet Take 1 tablet (80 mg total) by mouth every evening. 30 tablet 6   FIASP FLEXTOUCH 100 UNIT/ML FlexTouch Pen Inject 10 Units into the skin 2 (two) times daily.     FLUoxetine (PROZAC) 40 MG capsule Take 40 mg by mouth daily.     furosemide (LASIX) 40 MG tablet Take 1 tablet by mouth once daily 30 tablet 0   insulin glargine-yfgn (SEMGLEE) 100 UNIT/ML Pen 45 Units daily.     JARDIANCE 25 MG TABS tablet Take 25 mg by mouth daily.     metFORMIN (GLUCOPHAGE-XR) 500 MG 24 hr tablet Take 500 mg by mouth 2 (two) times daily.     Misc. Devices MISC Portable oxygen concentrator, 2L oxygen.  Diagnoses-chronic respiratory failure with hypoxia 1 each 0   nitroGLYCERIN (NITROSTAT) 0.4 MG SL tablet Dissolve 1 tablet under the tongue every 5 minutes as needed for chest pain. Max of 3 doses, then 911. 25 tablet 6   OZEMPIC, 2 MG/DOSE, 8 MG/3ML SOPN Inject 8 mg into the skin once a week.     prasugrel (EFFIENT) 5 MG TABS tablet Take 1 tablet (5 mg total) by mouth daily. 90 tablet 3   pregabalin (LYRICA) 100 MG capsule Take 100 mg by mouth 3 (three) times daily.  sacubitril-valsartan (ENTRESTO) 24-26 MG Take 1 tablet by mouth 2 (two) times daily. 180 tablet 3   No current facility-administered medications for this encounter.    Physical Findings: The patient is in no acute distress. Patient is alert and oriented.  vitals were not taken for this visit. .  No significant changes. Lungs are clear to auscultation bilaterally. Heart has regular rate and rhythm. No palpable cervical, supraclavicular, or axillary adenopathy. Abdomen soft, non-tender, normal bowel sounds.  On pelvic examination the external genitalia were unremarkable. A speculum exam was  performed. There are no mucosal lesions noted in the vaginal vault. A Pap smear was obtained of the proximal vagina. On bimanual and rectovaginal examination there were no pelvic masses appreciated. ***   Lab Findings: Lab Results  Component Value Date   WBC 11.3 (H) 01/25/2023   HGB 13.2 01/25/2023   HCT 39.2 01/25/2023   MCV 86 01/25/2023   PLT 293 01/25/2023    Radiographic Findings: No results found.  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  ***  Plan:  ***   *** minutes of total time was spent for this patient encounter, including preparation, face-to-face counseling with the patient and coordination of care, physical exam, and documentation of the encounter. ____________________________________  Billie Lade, PhD, MD  This document serves as a record of services personally performed by Antony Blackbird, MD. It was created on his behalf by Neena Rhymes, a trained medical scribe. The creation of this record is based on the scribe's personal observations and the provider's statements to them. This document has been checked and approved by the attending provider.

## 2023-04-01 ENCOUNTER — Encounter: Payer: Self-pay | Admitting: Radiation Oncology

## 2023-04-01 ENCOUNTER — Other Ambulatory Visit: Payer: Self-pay

## 2023-04-01 ENCOUNTER — Ambulatory Visit
Admission: RE | Admit: 2023-04-01 | Discharge: 2023-04-01 | Disposition: A | Discharge status: Home / Self Care | Payer: Medicaid Other | Source: Ambulatory Visit | Attending: Radiation Oncology | Admitting: Radiation Oncology

## 2023-04-01 VITALS — BP 125/67 | HR 68 | Temp 97.1°F | Resp 18 | Ht 62.0 in | Wt 214.0 lb

## 2023-04-01 DIAGNOSIS — Z923 Personal history of irradiation: Secondary | ICD-10-CM | POA: Insufficient documentation

## 2023-04-01 DIAGNOSIS — Z79899 Other long term (current) drug therapy: Secondary | ICD-10-CM | POA: Insufficient documentation

## 2023-04-01 DIAGNOSIS — Z90710 Acquired absence of both cervix and uterus: Secondary | ICD-10-CM | POA: Diagnosis not present

## 2023-04-01 DIAGNOSIS — Z8541 Personal history of malignant neoplasm of cervix uteri: Secondary | ICD-10-CM | POA: Diagnosis present

## 2023-04-01 DIAGNOSIS — E041 Nontoxic single thyroid nodule: Secondary | ICD-10-CM | POA: Diagnosis not present

## 2023-04-01 DIAGNOSIS — Z7902 Long term (current) use of antithrombotics/antiplatelets: Secondary | ICD-10-CM | POA: Insufficient documentation

## 2023-04-01 DIAGNOSIS — Z90722 Acquired absence of ovaries, bilateral: Secondary | ICD-10-CM | POA: Insufficient documentation

## 2023-04-01 DIAGNOSIS — C541 Malignant neoplasm of endometrium: Secondary | ICD-10-CM

## 2023-04-01 DIAGNOSIS — Z7984 Long term (current) use of oral hypoglycemic drugs: Secondary | ICD-10-CM | POA: Insufficient documentation

## 2023-04-01 DIAGNOSIS — Z7982 Long term (current) use of aspirin: Secondary | ICD-10-CM | POA: Diagnosis not present

## 2023-04-01 NOTE — Progress Notes (Signed)
Brenda [name] is here today for follow up post radiation to the pelvis.  They completed their radiation on: 03/07/22   Does the patient complain of any of the following:  Pain:No Abdominal bloating: No Diarrhea/Constipation: Diarrhea due to medication.  Nausea/Vomiting: No Vaginal Discharge: No Blood in Urine or Stool: No Urinary Issues (dysuria/incomplete emptying/ incontinence/ increased frequency/urgency): No Does patient report using vaginal dilator 2-3 times a week and/or sexually active 2-3 weeks: Yes Post radiation skin changes: No   Additional comments if applicable: BP 125/67 (BP Location: Left Arm, Patient Position: Sitting)   Pulse 68   Temp (!) 97.1 F (36.2 C) (Temporal)   Resp 18   Ht 5\' 2"  (1.575 m)   Wt 214 lb (97.1 kg)   LMP 10/30/2014 (Approximate)   SpO2 100%   BMI 39.14 kg/m

## 2023-04-10 DIAGNOSIS — U071 COVID-19: Secondary | ICD-10-CM | POA: Insufficient documentation

## 2023-04-11 DIAGNOSIS — Z09 Encounter for follow-up examination after completed treatment for conditions other than malignant neoplasm: Secondary | ICD-10-CM | POA: Insufficient documentation

## 2023-04-19 ENCOUNTER — Emergency Department (HOSPITAL_COMMUNITY)
Admission: EM | Admit: 2023-04-19 | Discharge: 2023-04-19 | Disposition: A | Discharge status: Home / Self Care | Payer: Medicaid Other | Source: Home / Self Care | Attending: Emergency Medicine | Admitting: Emergency Medicine

## 2023-04-19 ENCOUNTER — Encounter (HOSPITAL_COMMUNITY): Payer: Self-pay

## 2023-04-19 ENCOUNTER — Other Ambulatory Visit: Payer: Self-pay

## 2023-04-19 ENCOUNTER — Telehealth: Payer: Self-pay | Admitting: Cardiovascular Disease

## 2023-04-19 ENCOUNTER — Emergency Department (HOSPITAL_COMMUNITY): Payer: Medicaid Other

## 2023-04-19 DIAGNOSIS — E876 Hypokalemia: Secondary | ICD-10-CM | POA: Insufficient documentation

## 2023-04-19 DIAGNOSIS — Z794 Long term (current) use of insulin: Secondary | ICD-10-CM | POA: Diagnosis not present

## 2023-04-19 DIAGNOSIS — E119 Type 2 diabetes mellitus without complications: Secondary | ICD-10-CM | POA: Diagnosis not present

## 2023-04-19 DIAGNOSIS — G629 Polyneuropathy, unspecified: Secondary | ICD-10-CM | POA: Insufficient documentation

## 2023-04-19 DIAGNOSIS — R110 Nausea: Secondary | ICD-10-CM

## 2023-04-19 DIAGNOSIS — I2510 Atherosclerotic heart disease of native coronary artery without angina pectoris: Secondary | ICD-10-CM | POA: Diagnosis not present

## 2023-04-19 DIAGNOSIS — I509 Heart failure, unspecified: Secondary | ICD-10-CM | POA: Diagnosis not present

## 2023-04-19 DIAGNOSIS — Z7984 Long term (current) use of oral hypoglycemic drugs: Secondary | ICD-10-CM | POA: Insufficient documentation

## 2023-04-19 DIAGNOSIS — Z79899 Other long term (current) drug therapy: Secondary | ICD-10-CM | POA: Insufficient documentation

## 2023-04-19 DIAGNOSIS — Z7982 Long term (current) use of aspirin: Secondary | ICD-10-CM | POA: Diagnosis not present

## 2023-04-19 DIAGNOSIS — R0602 Shortness of breath: Secondary | ICD-10-CM | POA: Diagnosis not present

## 2023-04-19 DIAGNOSIS — Z8542 Personal history of malignant neoplasm of other parts of uterus: Secondary | ICD-10-CM | POA: Insufficient documentation

## 2023-04-19 DIAGNOSIS — I110 Hypertensive heart disease with heart failure: Secondary | ICD-10-CM | POA: Insufficient documentation

## 2023-04-19 LAB — BASIC METABOLIC PANEL
Anion gap: 17 — ABNORMAL HIGH (ref 5–15)
BUN: 12 mg/dL (ref 6–20)
CO2: 24 mmol/L (ref 22–32)
Calcium: 9.5 mg/dL (ref 8.9–10.3)
Chloride: 93 mmol/L — ABNORMAL LOW (ref 98–111)
Creatinine, Ser: 0.91 mg/dL (ref 0.44–1.00)
GFR, Estimated: >60 mL/min (ref >60)
Glucose, Bld: 141 mg/dL — ABNORMAL HIGH (ref 70–99)
Potassium: 3 mmol/L — ABNORMAL LOW (ref 3.5–5.1)
Sodium: 134 mmol/L — ABNORMAL LOW (ref 135–145)

## 2023-04-19 LAB — CBC
HCT: 45.2 % (ref 36.0–46.0)
Hemoglobin: 14.8 g/dL (ref 12.0–15.0)
MCH: 28.8 pg (ref 26.0–34.0)
MCHC: 32.7 g/dL (ref 30.0–36.0)
MCV: 87.9 fL (ref 80.0–100.0)
Platelets: 307 10*3/uL (ref 150–400)
RBC: 5.14 MIL/uL — ABNORMAL HIGH (ref 3.87–5.11)
RDW: 14 % (ref 11.5–15.5)
WBC: 9.4 10*3/uL (ref 4.0–10.5)
nRBC: 0 % (ref 0.0–0.2)

## 2023-04-19 LAB — HEPATIC FUNCTION PANEL
ALT: 25 U/L (ref 0–44)
AST: 18 U/L (ref 15–41)
Albumin: 3.9 g/dL (ref 3.5–5.0)
Alkaline Phosphatase: 75 U/L (ref 38–126)
Bilirubin, Direct: 0.1 mg/dL (ref 0.0–0.2)
Indirect Bilirubin: 0.5 mg/dL (ref 0.3–0.9)
Total Bilirubin: 0.6 mg/dL (ref 0.3–1.2)
Total Protein: 7.8 g/dL (ref 6.5–8.1)

## 2023-04-19 LAB — CBG MONITORING, ED: Glucose-Capillary: 145 mg/dL — ABNORMAL HIGH (ref 70–99)

## 2023-04-19 LAB — MAGNESIUM: Magnesium: 1.6 mg/dL — ABNORMAL LOW (ref 1.7–2.4)

## 2023-04-19 LAB — TROPONIN I (HIGH SENSITIVITY)
Troponin I (High Sensitivity): 4 ng/L (ref <18)
Troponin I (High Sensitivity): 5 ng/L (ref <18)

## 2023-04-19 LAB — D-DIMER, QUANTITATIVE: D-Dimer, Quant: 0.64 ug{FEU}/mL — ABNORMAL HIGH (ref 0.00–0.50)

## 2023-04-19 LAB — BRAIN NATRIURETIC PEPTIDE: B Natriuretic Peptide: 24.2 pg/mL (ref 0.0–100.0)

## 2023-04-19 LAB — LIPASE, BLOOD: Lipase: 21 U/L (ref 11–51)

## 2023-04-19 MED ORDER — MAGNESIUM SULFATE 2 GM/50ML IV SOLN
2.0000 g | Freq: Once | INTRAVENOUS | Status: AC
  Administered 2023-04-19: 2 g via INTRAVENOUS
  Filled 2023-04-19: qty 50

## 2023-04-19 MED ORDER — POTASSIUM CHLORIDE CRYS ER 20 MEQ PO TBCR
40.0000 meq | EXTENDED_RELEASE_TABLET | Freq: Once | ORAL | Status: AC
  Administered 2023-04-19: 40 meq via ORAL
  Filled 2023-04-19: qty 2

## 2023-04-19 MED ORDER — SODIUM CHLORIDE 0.9 % IV BOLUS
500.0000 mL | Freq: Once | INTRAVENOUS | Status: AC
  Administered 2023-04-19: 500 mL via INTRAVENOUS

## 2023-04-19 MED ORDER — ONDANSETRON HCL 4 MG/2ML IJ SOLN
4.0000 mg | Freq: Once | INTRAMUSCULAR | Status: AC
  Administered 2023-04-19: 4 mg via INTRAVENOUS
  Filled 2023-04-19: qty 2

## 2023-04-19 MED ORDER — ONDANSETRON HCL 4 MG PO TABS: 4.0000 mg | ORAL_TABLET | Freq: Four times a day (QID) | ORAL | 0 refills | Status: DC

## 2023-04-19 NOTE — Telephone Encounter (Signed)
Call transferred to DOD who provided recommendations to check troponin

## 2023-04-19 NOTE — ED Triage Notes (Signed)
Pt is coming in from pcp where she was presenting with some tachycardia, MD was concerned that she had heart attack at some point, pcp said they said " some sort of blip " on the EKG. She is presenting with some SHOB but is always on at home O2. Pt is having no chest pain right now. She mentions she has some nausea, and general fatigue.

## 2023-04-19 NOTE — Telephone Encounter (Signed)
NP from [facility] is calling in regards to patient, transferred to [employer].

## 2023-04-19 NOTE — ED Provider Notes (Signed)
[place] EMERGENCY DEPARTMENT AT [facility] Provider Note   CSN: [id number] Arrival date & time: 04/19/23  1520     History  Chief Complaint  Patient presents with   Shortness of Breath    Brenda [name] is a 62 y.o. female.  Patient is a 62 year old female with a past medical history of CAD, CHF, hypertension, diabetes, anemia on chronic 2 L nasal cannula and prior endometrial cancer now in remission presenting to the emergency department with shortness of breath and nausea.  Patient reports that she has had fatigue and nausea ongoing for about the last 3 weeks.  She states that she has had a mild increase of shortness of breath from her baseline.  She states that she has been having "hot flashes" but has had no measured fevers at home.  She denies any chest pain or cough.  She states that she took a home COVID test that was positive and her primary doctor started her on Paxlovid treatment.  She states that she was seen in the office today as a follow-up and was told that her EKG looked abnormal and her heart rate was elevated so they recommended that she come to the emergency department.  The patient does report that she was having nausea and fatigue with her last MI.  She denies any history of blood clots or lower extremity swelling, recent hospitalization or surgery or recent long travel in the car or plane.  She denies any blood thinner use.  The history is provided by the patient.  Shortness of Breath      Home Medications Prior to Admission medications   Medication Sig Start Date End Date Taking? Authorizing Provider  ondansetron (ZOFRAN) 4 MG tablet Take 1 tablet (4 mg total) by mouth every 6 (six) hours. 04/19/23  Yes Elayne Snare K, DO  aspirin 81 MG EC tablet Take 1 tablet (81 mg total) by mouth daily. 02/14/21   Theotis Barrio, MD  atorvastatin (LIPITOR) 80 MG tablet Take 1 tablet (80 mg total) by mouth every evening. 10/03/21   Newlin, Odette Horns, MD   FIASP FLEXTOUCH 100 UNIT/ML FlexTouch Pen Inject 10 Units into the skin 2 (two) times daily. 12/08/22   [provider]  FLUoxetine (PROZAC) 40 MG capsule Take 40 mg by mouth daily. 01/17/23   [provider]  furosemide (LASIX) 40 MG tablet Take 1 tablet by mouth once daily 02/05/22   Hoy Register, MD  JARDIANCE 25 MG TABS tablet Take 25 mg by mouth daily. 06/07/22   [provider]  metFORMIN (GLUCOPHAGE-XR) 500 MG 24 hr tablet Take 500 mg by mouth 2 (two) times daily. 07/05/22   [provider]  Misc. Devices MISC Portable oxygen concentrator, 2L oxygen.  Diagnoses-chronic respiratory failure with hypoxia 11/27/21   Hoy Register, MD  nitroGLYCERIN (NITROSTAT) 0.4 MG SL tablet Dissolve 1 tablet under the tongue every 5 minutes as needed for chest pain. Max of 3 doses, then 911. 01/25/23   Weaver, Scott T, PA-C  OZEMPIC, 2 MG/DOSE, 8 MG/3ML SOPN Inject 8 mg into the skin once a week. 11/13/22   [provider]  prasugrel (EFFIENT) 5 MG TABS tablet Take 1 tablet (5 mg total) by mouth daily. 01/25/23 01/25/24  Tereso Newcomer T, PA-C  pregabalin (LYRICA) 100 MG capsule Take 100 mg by mouth 3 (three) times daily. 10/30/22   [provider]  sacubitril-valsartan (ENTRESTO) 24-26 MG Take 1 tablet by mouth 2 (two) times daily. 01/25/23  Tereso Newcomer T, PA-C      Allergies    Hydrocodone, Clopidogrel, and Clopidogrel bisulfate    Review of Systems   Review of Systems  Respiratory:  Positive for shortness of breath.     Physical Exam Updated Vital Signs BP 110/72   Pulse 73   Temp 97.7 F (36.5 C) (Oral)   Resp (!) 33   LMP 10/30/2014 (Approximate)   SpO2 100%  Physical Exam Vitals and nursing note reviewed.  Constitutional:      General: She is not in acute distress.    Appearance: She is well-developed. She is obese.  HENT:     Head: Normocephalic and atraumatic.     Mouth/Throat:     Mouth: Mucous membranes are moist.     Pharynx:  Oropharynx is clear.  Eyes:     Extraocular Movements: Extraocular movements intact.  Cardiovascular:     Rate and Rhythm: Normal rate and regular rhythm.  Pulmonary:     Effort: Pulmonary effort is normal.     Breath sounds: Normal breath sounds.  Abdominal:     Palpations: Abdomen is soft.     Tenderness: There is no abdominal tenderness.  Musculoskeletal:        General: Normal range of motion.     Cervical back: Normal range of motion and neck supple.     Right lower leg: No edema.     Left lower leg: No edema.  Skin:    General: Skin is warm and dry.  Neurological:     General: No focal deficit present.     Mental Status: She is alert and oriented to person, place, and time.  Psychiatric:        Mood and Affect: Mood normal.        Behavior: Behavior normal.     ED Results / Procedures / Treatments   Labs (all labs ordered are listed, but only abnormal results are displayed) Labs Reviewed  BASIC METABOLIC PANEL - Abnormal; Notable for the following components:      Result Value   Sodium 134 (*)    Potassium 3.0 (*)    Chloride 93 (*)    Glucose, Bld 141 (*)    Anion gap 17 (*)    All other components within normal limits  CBC - Abnormal; Notable for the following components:   RBC 5.14 (*)    All other components within normal limits  D-DIMER, QUANTITATIVE (NOT AT [facility]) - Abnormal; Notable for the following components:   D-Dimer, Quant 0.64 (*)    All other components within normal limits  MAGNESIUM - Abnormal; Notable for the following components:   Magnesium 1.6 (*)    All other components within normal limits  CBG MONITORING, ED - Abnormal; Notable for the following components:   Glucose-Capillary 145 (*)    All other components within normal limits  BRAIN NATRIURETIC PEPTIDE  HEPATIC FUNCTION PANEL  LIPASE, BLOOD  I-STAT CG4 LACTIC ACID, ED  TROPONIN I (HIGH SENSITIVITY)  TROPONIN I (HIGH SENSITIVITY)    EKG EKG Interpretation Date/Time:  Friday  April 19 2023 15:14:37 EDT Ventricular Rate:  87 PR Interval:  182 QRS Duration:  114 QT Interval:  396 QTC Calculation: 476 R Axis:   266  Text Interpretation: Normal sinus rhythm Possible Right ventricular hypertrophy Inferior infarct , age undetermined Anterolateral infarct , age undetermined Abnormal ECG No significant change since last tracing Confirmed by Elayne Snare ([id number]) on 04/19/2023 4:33:27 PM  Radiology DG  Chest 2 View  Result Date: 04/19/2023 CLINICAL DATA:  Chest pain and shortness of breath for 3 weeks. Nausea. EXAM: CHEST - 2 VIEW COMPARISON:  10/03/2021 FINDINGS: Heart size and mediastinal contours are normal. Asymmetric elevation of the right hemidiaphragm is unchanged. No pleural effusion, interstitial edema or airspace disease. Visualized osseous structures are unremarkable. IMPRESSION: 1. No acute cardiopulmonary disease. 2. Unchanged asymmetric elevation of the right hemidiaphragm. Electronically Signed   By: Signa Kell M.D.   On: 04/19/2023 16:50    Procedures Procedures    Medications Ordered in ED Medications  potassium chloride SA (KLOR-CON M) CR tablet 40 mEq (has no administration in time range)  sodium chloride 0.9 % bolus 500 mL (has no administration in time range)  magnesium sulfate IVPB 2 g 50 mL (has no administration in time range)  ondansetron (ZOFRAN) injection 4 mg (4 mg Intravenous Given 04/19/23 1812)    ED Course/ Medical Decision Making/ A&P Clinical Course as of 04/19/23 1959  Fri Apr 19, 2023  1844 Hypokalemia and mildly elevated AGAP. Will be repleted and given gentle fluids. Lactic acid ordered. [VK]  1921 Mildly elevated d-dimer, however patient is low risk by YEARS criteria making PE unlikely. [VK]  1951 Mildly low mag will be repleted. Troponins have remained negative. She is stable for discharge home after repletion with outpatient follow up. [VK]    Clinical Course User Index [VK] Rexford Maus, DO                                  Medical Decision Making This patient presents to the ED with chief complaint(s) of SOB, nausea with pertinent past medical history of CAD, CHF, HTN, DM, anemia on O2, recent COVID infection which further complicates the presenting complaint. The complaint involves an extensive differential diagnosis and also carries with it a high risk of complications and morbidity.    The differential diagnosis includes ACS, arrhythmia, anemia, pneumonia, pneumothorax, pulmonary edema, pleural effusion, considering PE with recent COVID infection and history of cancer, dehydration, electrolyte abnormality pancreatitis, hepatitis  Additional history obtained: Additional history obtained from N/A Records reviewed outpatient oncology and cardiology records  ED Course and Reassessment: On patient's arrival she is hemodynamically stable in no acute distress.  She was initially evaluated by triage and had basic labs and EKG performed.  EKG showed normal sinus rhythm without acute ischemic changes.  She will have labs including troponin, BNP, D-dimer, LFTs and lipase additionally performed.  She was given Zofran for her nausea and will be closely reassessed.  Independent labs interpretation:  The following labs were independently interpreted: hypokalemia, hypomag, mildly low bicarb consistent with dehydration, otherwise within normal range  Independent visualization of imaging: - I independently visualized the following imaging with scope of interpretation limited to determining acute life threatening conditions related to emergency care: CXR, which revealed no acute disease  Consultation: - Consulted or discussed management/test interpretation w/ external professional: N/A  Consideration for admission or further workup: Patient has no emergent conditions requiring admission or further work-up at this time and is stable for discharge home with primary care follow-up  Social Determinants of health:  N/A    Amount and/or Complexity of Data Reviewed Labs: ordered. Radiology: ordered.  Risk Prescription drug management.          Final Clinical Impression(s) / ED Diagnoses Final diagnoses:  Nausea  Hypokalemia  Hypomagnesemia    Rx /  DC Orders ED Discharge Orders          Ordered    ondansetron (ZOFRAN) 4 MG tablet  Every 6 hours        04/19/23 1957              Rexford Maus, DO 04/19/23 1959

## 2023-04-19 NOTE — Discharge Instructions (Addendum)
You were seen in the emergency department for your nausea, fatigue and shortness of breath.  Your workup showed no signs of heart attack or stress on your heart, pneumonia or blood clots.  Your electrolytes were low and you appeared dehydrated which is likely what is causing some of your symptoms.  We gave you some fluids and electrolyte repletion in the emergency department and you should follow-up with your primary doctor within the next week to have your symptoms and electrolytes rechecked.  I have given you nausea medicine you can continue to take as needed.  You should return to the emergency department if having repetitive vomiting despite the nausea medicine you are having increasing weakness and unable to walk, you pass out or if you have any other new or concerning symptoms.

## 2023-04-26 DIAGNOSIS — E876 Hypokalemia: Secondary | ICD-10-CM | POA: Insufficient documentation

## 2023-06-04 ENCOUNTER — Encounter: Payer: Self-pay | Admitting: Gynecologic Oncology

## 2023-06-07 ENCOUNTER — Encounter: Payer: Self-pay | Admitting: Gynecologic Oncology

## 2023-06-07 ENCOUNTER — Inpatient Hospital Stay: Payer: Medicaid Other | Admitting: Gynecologic Oncology

## 2023-06-07 ENCOUNTER — Inpatient Hospital Stay: Payer: Medicaid Other | Attending: Gynecologic Oncology | Admitting: Gynecologic Oncology

## 2023-06-07 VITALS — BP 116/65 | HR 86 | Temp 98.0°F | Resp 18 | Ht 62.0 in | Wt 220.8 lb

## 2023-06-07 DIAGNOSIS — Z8542 Personal history of malignant neoplasm of other parts of uterus: Secondary | ICD-10-CM | POA: Diagnosis not present

## 2023-06-07 DIAGNOSIS — Z90722 Acquired absence of ovaries, bilateral: Secondary | ICD-10-CM | POA: Insufficient documentation

## 2023-06-07 DIAGNOSIS — Z90710 Acquired absence of both cervix and uterus: Secondary | ICD-10-CM | POA: Insufficient documentation

## 2023-06-07 DIAGNOSIS — C541 Malignant neoplasm of endometrium: Secondary | ICD-10-CM

## 2023-06-07 DIAGNOSIS — Z923 Personal history of irradiation: Secondary | ICD-10-CM | POA: Diagnosis not present

## 2023-06-07 NOTE — Progress Notes (Signed)
Gynecologic Oncology Return Clinic Visit  06/07/23  Reason for Visit: surveillance in the setting of endometrial cancer   Treatment History: Oncology History Overview Note  MMR IHC: loss of MLH1 and PMS2 MSI - high MLH1 hyper methylation present  Tumor size 6 cm   Endometrial adenocarcinoma (HCC)  10/26/2021 Initial Biopsy   EMB: grade 1 endometrioid adenocarcinoma   11/21/2021 Imaging   CT A/P: 1. No evidence for metastatic disease in the abdomen or pelvis. 2. Endometrium appears thickened, but not well demonstrated by CT. Endometrial stripe appears to be 4.7 cm in thickness on sagittal CT imaging today. 3. Complex paraumbilical hernia contains only fat. 4. Skin thickening with soft tissue stranding noted anterior lower abdominal wall. This could be related to cellulitis and should be amenable to clinical inspection. 5. Advanced coronary artery calcification in the left coronary distribution.   12/06/2021 Initial Diagnosis   Endometrial adenocarcinoma (HCC)   12/06/2021 Surgery   Robotic-assisted laparoscopic total hysterectomy with bilateral salpingoophorectomy, laparoscopic hernia reduction Findings: On EUA, 12 cm mobile uterus.  On intra-abdominal entry, normal appearing upper abdominal survey although right liver edge and diaphragm obscured secondary to adhesions from cholecystectomy.  Normal-appearing omentum with portion of the omentum within a 3 cm umbilical hernia (reduced).  Normal-appearing small and large bowel.  Normal-appearing bilateral fallopian tubes and ovaries.  Uterus 12 cm in quite bulbous.  No obvious adenopathy.  No ascites.  No clear evidence of intra-abdominal or pelvic disease.   12/13/2021 Pathology Results   A. UTERUS, CERVIX, TUBES AND OVARIES:  - Endometrial adenocarcinoma with foci of squamous differentiation, FIGO  grade 1  - Carcinoma invades for a depth of 1.6 cm where myometrial thickness is  2.8 cm ( 60%)  - Carcinoma does not involve the  cervix  - Benign bilateral fallopian tubes and ovaries  - See oncology table   ONCOLOGY TABLE:   UTERUS, CARCINOMA OR CARCINOSARCOMA: Resection   Procedure: Total hysterectomy and bilateral salpingo-oophorectomy  Histologic Type: Endometrioid carcinoma with foci of squamous  differentiation  Histologic Grade: FIGO grade 1  Myometrial Invasion:       Depth of Myometrial Invasion (mm): 16 mm       Myometrial Thickness (mm): 28 mm       Percentage of Myometrial Invasion: 57%  Uterine Serosa Involvement: Not identified  Cervical stromal Involvement: Not identified  Extent of involvement of other tissue/organs: Not identified  Peritoneal/Ascitic Fluid: Not applicable  Lymphovascular Invasion: Not identified  Regional Lymph Nodes: Not applicable (no lymph nodes submitted or found)        Pelvic Lymph Nodes Examined:                                   0 Sentinel                                   0 Non-sentinel                                   0 Total       Pelvic Lymph Nodes with Metastasis: Not applicable       Para-aortic Lymph Nodes Examined:  0 Sentinel                                    0 non-sentinel                                    0 total       Para-aortic Lymph Nodes with Metastasis: Not applicable  Distant Metastasis:       Distant Site(s) Involved: Not applicable  Pathologic Stage Classification (pTNM, AJCC 8th Edition): pT1b, pN not  assigned  Ancillary Studies: MMR / MSI testing will be ordered  Representative Tumor Block: A3    01/22/2022 - 03/07/2022 Radiation Therapy   01/22/2022 through 03/07/2022 Site Technique Total Dose (Gy) Dose per Fx (Gy) Completed Fx Beam Energies  Vagina: Pelvis HDR-brachy 30/30 6 5/5 [id number]         Interval History: Doing well.  Denies any vaginal bleeding or discharge.  Denies abdominal or pelvic pain.  Continues to use her vaginal dilator regularly.  Reports some looser stools secondary to  Glucophage.  Has intermittent urinary frequency when she takes Lasix.    Past Medical/Surgical History: Past Medical History:  Diagnosis Date   Anemia    Arthritis    "back, hands, hips" (02/22/2015)   CAD (coronary artery disease)    a. complex LAD/diagonal bifurcation PCI in 2010. b. STEMI 10/2019 s/p PTCA/DES x1 to mLAD overlapping the old stent, residual disease treated medicaly.   Cancer [facility])    uterine   CHF (congestive heart failure) (HCC)    Depression    Diabetes mellitus (HCC)    "started when I was pregnant; not sure if it was type 1 or type 2"    History of radiation therapy    Endometrium- HDR 01/22/22-03/07/22- Dr. Antony Blackbird   Hypercholesterolemia    Hypertension    Morbid obesity (HCC)    Myocardial infarction (HCC)    mild x 3   Neuromuscular disorder (HCC)    neuropathy feet   Sleep apnea    cpap/    Past Surgical History:  Procedure Laterality Date   CARDIAC CATHETERIZATION  12/02/2012   CHOLECYSTECTOMY OPEN  09/04/1987   CORONARY ANGIOPLASTY WITH STENT PLACEMENT  09/03/2009   CORONARY/GRAFT ACUTE MI REVASCULARIZATION N/A 10/26/2019   Procedure: CORONARY/GRAFT ACUTE MI REVASCULARIZATION;  Surgeon: Kathleene Hazel, MD;  Location: [facility] INVASIVE [facility];  Service: Cardiovascular;  Laterality: N/A;   INCISIONAL HERNIA REPAIR N/A 12/09/2021   Procedure: LAPAROSCOPIC REPAIR UMBILICAL HERNIA WITH MESH;  Surgeon: Abigail Miyamoto, MD;  Location: [facility] ORS;  Service: General;  Laterality: N/A;   LEFT HEART CATH AND CORONARY ANGIOGRAPHY N/A 10/26/2019   Procedure: LEFT HEART CATH AND CORONARY ANGIOGRAPHY;  Surgeon: Kathleene Hazel, MD;  Location: [facility] CV [facility];  Service: Cardiovascular;  Laterality: N/A;   LEFT HEART CATHETERIZATION WITH CORONARY ANGIOGRAM N/A 12/25/2012   Procedure: LEFT HEART CATHETERIZATION WITH CORONARY ANGIOGRAM;  Surgeon: Tonny Bollman, MD;  Location: [facility] CATH LAB;  Service: Cardiovascular;  Laterality: N/A;   ROBOTIC ASSISTED  TOTAL HYSTERECTOMY WITH BILATERAL SALPINGO OOPHERECTOMY N/A 12/06/2021   Procedure: XI ROBOTIC ASSISTED TOTAL HYSTERECTOMY WITH BILATERAL SALPINGO OOPHORECTOMY;  Surgeon: Carver Fila, MD;  Location: [facility] ORS;  Service: Gynecology;  Laterality: N/A;   UMBILICAL HERNIA REPAIR  05/04/1989   doesn't think they used mesh  Family History  Problem Relation Age of Onset   Coronary artery disease Mother        in her mid 21s   Hypertension Mother    Cancer Mother        skin   Heart attack Father        in his mid 38s   COPD Father    Stroke Sister    Coronary artery disease Sister    Diabetes Sister    Other Half-Sister    Thyroid cancer Daughter    Prostate cancer Neg Hx    Colon cancer Neg Hx    Breast cancer Neg Hx    Endometrial cancer Neg Hx    Ovarian cancer Neg Hx    Pancreatic cancer Neg Hx     Social History   Socioeconomic History   Marital status: Married    Spouse name: Not on file   Number of children: 2   Years of education: Not on file   Highest education level: Not on file  Occupational History   Occupation: Housewife  Tobacco Use   Smoking status: Former    Current packs/day: 0.00    Average packs/day: 0.5 packs/day for 1 year (0.5 ttl pk-yrs)    Types: Cigarettes, Cigars    Start date: 09/03/1985    Quit date: 09/03/1986    Years since quitting: 36.7   Smokeless tobacco: Never  Vaping Use   Vaping status: Never Used  Substance and Sexual Activity   Alcohol use: No   Drug use: No   Sexual activity: Not Currently  Other Topics Concern   Not on file  Social History Narrative   Not on file   Social Determinants of Health   Financial Resource Strain: High Risk (02/10/2021)   Overall Financial Resource Strain (CARDIA)    Difficulty of Paying Living Expenses: Hard  Food Insecurity: Food Insecurity Present (02/10/2021)   Hunger Vital Sign    Worried About Running Out of Food in the Last Year: Sometimes true    Ran Out of Food in the Last Year:  Sometimes true  Transportation Needs: No Transportation Needs (02/10/2021)   PRAPARE - Administrator, Civil Service (Medical): No    Lack of Transportation (Non-Medical): No  Physical Activity: Not on file  Stress: Not on file  Social Connections: Not on file    Current Medications:  Current Outpatient Medications:    aspirin 81 MG EC tablet, Take 1 tablet (81 mg total) by mouth daily., Disp: 30 tablet, Rfl: 11   atorvastatin (LIPITOR) 80 MG tablet, Take 1 tablet (80 mg total) by mouth every evening., Disp: 30 tablet, Rfl: 6   FIASP FLEXTOUCH 100 UNIT/ML FlexTouch Pen, Inject 10 Units into the skin 2 (two) times daily., Disp: , Rfl:    FLUoxetine (PROZAC) 40 MG capsule, Take 40 mg by mouth daily., Disp: , Rfl:    furosemide (LASIX) 40 MG tablet, Take 1 tablet by mouth once daily, Disp: 30 tablet, Rfl: 0   JARDIANCE 25 MG TABS tablet, Take 25 mg by mouth daily., Disp: , Rfl:    metFORMIN (GLUCOPHAGE-XR) 500 MG 24 hr tablet, Take 500 mg by mouth 3 (three) times daily., Disp: , Rfl:    Misc. Devices MISC, Portable oxygen concentrator, 2L oxygen.  Diagnoses-chronic respiratory failure with hypoxia, Disp: 1 each, Rfl: 0   nitroGLYCERIN (NITROSTAT) 0.4 MG SL tablet, Dissolve 1 tablet under the tongue every 5 minutes as needed for chest pain. Max of  3 doses, then 911., Disp: 25 tablet, Rfl: 6   OZEMPIC, 2 MG/DOSE, 8 MG/3ML SOPN, Inject 8 mg into the skin once a week., Disp: , Rfl:    prasugrel (EFFIENT) 5 MG TABS tablet, Take 1 tablet (5 mg total) by mouth daily., Disp: 90 tablet, Rfl: 3   pregabalin (LYRICA) 100 MG capsule, Take 100 mg by mouth 3 (three) times daily., Disp: , Rfl:    sacubitril-valsartan (ENTRESTO) 24-26 MG, Take 1 tablet by mouth 2 (two) times daily., Disp: 180 tablet, Rfl: 3  Review of Systems: Denies appetite changes, fevers, chills, fatigue, unexplained weight changes. Denies hearing loss, neck lumps or masses, mouth sores, ringing in ears or voice  changes. Denies cough or wheezing.  Denies shortness of breath. Denies chest pain or palpitations. Denies leg swelling. Denies abdominal distention, pain, blood in stools, constipation, diarrhea, nausea, vomiting, or early satiety. Denies pain with intercourse, dysuria, frequency, hematuria or incontinence. Denies hot flashes, pelvic pain, vaginal bleeding or vaginal discharge.   Denies joint pain, back pain or muscle pain/cramps. Denies itching, rash, or wounds. Denies dizziness, headaches, numbness or seizures. Denies swollen lymph nodes or glands, denies easy bruising or bleeding. Denies anxiety, depression, confusion, or decreased concentration.  Physical Exam: BP 116/65   Pulse 86   Temp 98 F (36.7 C)   Resp 18   Ht 5\' 2"  (1.575 m)   Wt 220 lb 12.8 oz (100.2 kg)   LMP 10/30/2014 (Approximate)   SpO2 99%   BMI 40.38 kg/m  General: Alert, oriented, no acute distress. HEENT: Normocephalic, atraumatic, sclera anicteric. Chest: Distant breath sounds but clear to auscultation bilaterally.  Unlabored breathing on oxygen by nasal cannula. Cardiovascular: Regular rate and rhythm, no murmurs. Abdomen: Obese, soft, nontender.  Normoactive bowel sounds.  No masses or hepatosplenomegaly appreciated.  Well-healed incisions.  Extremities: Grossly normal range of motion.  Warm, well perfused.  Trace edema bilaterally. Skin: No rashes or lesions noted. Lymphatics: No cervical, supraclavicular, or inguinal adenopathy. GU: Normal appearing external genitalia without erythema, excoriation, or lesions.  Speculum exam reveals Intact with minimal scarring at the apex, mild radiation changes.  Bimanual exam reveals cuff intact, no nodularity or masses.  Rectovaginal exam confirms findings.  Laboratory & Radiologic Studies: None new  Assessment & Plan: Brenda [name] is a 62 y.o. 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 No  LVI. MSI-high. MMR IHC with loss of MLH1 and PMS2.  MLH1 promoter hypermethylation present. Discussed options for adjuvant treatment.  Ultimately elected for treated with adjuvant VBT, completed in 03/2022. Presents for surveillance.   Continues to do well.  She is NED on exam today.   Per NCCN surveillance recommendations, discussed plan for surveillance visits every 3 months alternating between my office and radiation oncology.  She will see Dr. Roselind Messier in July and sees me again in October.  We discussed signs and symptoms that would be concerning for cancer recurrence, and I stressed the importance of calling if she develops any of these.  Discussed the importance of continued vaginal dilator use.  20 minutes of total time was spent for this patient encounter, including preparation, face-to-face counseling with the patient and coordination of care, and documentation of the encounter.  Eugene Garnet, MD  Division of Gynecologic Oncology  Department of Obstetrics and Gynecology  [facility]

## 2023-06-07 NOTE — Patient Instructions (Signed)
It was good to see you today.  I do not see or feel any evidence of cancer recurrence on your exam.  I will see you for follow-up in 6 months.  As always, if you develop any new and concerning symptoms before your next visit, please call to see me sooner.   

## 2023-07-09 ENCOUNTER — Telehealth: Payer: Self-pay | Admitting: Cardiovascular Disease

## 2023-07-09 NOTE — Telephone Encounter (Signed)
Will await clearance request to  come through [facility]. Pt has appt 07/19/23 with Dr. Excell Seltzer.

## 2023-07-09 NOTE — Telephone Encounter (Signed)
Paper Work Dropped Off: medical clearance for dental form - [facility]  Date: 07/09/2023  Location of paper:  pw has been faxed to pre op, original in folder at check out

## 2023-07-10 NOTE — Telephone Encounter (Signed)
   Patient Name: Brenda [name]  DOB: 1961/06/02 MRN: [id number]  Primary Cardiologist: Tonny Bollman, MD  Chart reviewed as part of pre-operative protocol coverage.  Simple dental extractions (i.e. 1-2 teeth) are considered low risk procedures per guidelines and generally do not require any specific cardiac clearance. It is also generally accepted that for simple extractions and dental cleanings, there is no need to interrupt blood thinner therapy.  SBE prophylaxis is not required for the patient from a cardiac standpoint.  I will route this recommendation to the requesting party via Epic fax function and remove from pre-op pool.  Please call with questions.  Napoleon Form, Leodis Rains, NP 07/10/2023, 10:31 AM

## 2023-07-10 NOTE — Telephone Encounter (Signed)
   Pre-operative Risk Assessment    Patient Name: Brenda [name]  DOB: [birth year] MRN: [id number]    DATE OF LAST VISIT: 01/25/23 Tereso Newcomer, PAC DATE OF NEXT VISIT: 07/19/23 DR. Excell Seltzer Request for Surgical Clearance    Procedure:  Dental Extraction - Amount of Teeth to be Pulled:  x 2 TEETH FOR SIMPLE EXTRACTION PER THE DENTAL OFFICE ; I CONFIRMED BY PHONE   Date of Surgery:  Clearance TBD                                 Surgeon:  DR. Chestine Spore, DDS Surgeon's Group or Practice Name:  [facility] Phone number:  [phone] Fax number:  [phone]   Type of Clearance Requested:   - Medical  - Pharmacy:  Hold Aspirin and Prasugrel (Effient)     Type of Anesthesia:  Local    Additional requests/questions:    Elpidio Anis   07/10/2023, 10:10 AM

## 2023-07-15 ENCOUNTER — Telehealth: Payer: Self-pay | Admitting: Nurse Practitioner

## 2023-07-15 NOTE — Telephone Encounter (Signed)
Dentist office calling back they have not received clearance Fax# [phone]

## 2023-07-15 NOTE — Telephone Encounter (Signed)
Faxed clearance over to fax # provided.

## 2023-07-18 NOTE — Progress Notes (Unsigned)
Cardiology Office Note:    Date:  07/22/2023   ID:  Brenda [name], DOB 1960-09-30, MRN [id number]  PCP:  Levonne Lapping, NP   [facility] Providers Cardiologist:  Tonny Bollman, MD     Referring MD: Levonne Lapping, NP   Chief Complaint  Patient presents with   Chronic heart failure with preserved ejection fraction    Coronary Artery Disease   Hyperlipidemia   Follow-up    6 months    History of Present Illness:    Brenda [name] is a 62 y.o. female with a hx of:  Coronary artery disease  S/p non-STEMI in 2000 tx w/ complex bifurcational LAD/Dx stenting Allergic to Plavix Anterior STEMI 10/2019 >> s/p 2.5 x 28 mm DES to mid LAD (overlapping with old stent); chronic ost Dx stenosis (jailed by stent)  LHC 10/26/2019: p LAD stent patent, m LAD 100 thrombotic, D1 stent 80 ISR, 70; p LCx 30 Myoview 11/29/21: Low risk, EF 71 (HFpEF) heart failure with preserved ejection fraction TTE 10/26/2019: EF 60-65, no RWMA, mild conc LVH, Gr 1 DD, normal RVSF, mild LAE TTE 02/08/2021: EF 60-65, no RWMA, moderately reduced RVSF RV Failure  Chronic respiratory failure Chronic O2 Diabetes mellitus, insulin dependent  OSA  Hypertension  Hyperlipidemia  Morbid obesity  Endometrial CA s/p Hysterectomy w BSO Admx with SBO 12/2021  The patient is here alone today.  She is doing well from a cardiac perspective.  She has had longstanding morbid obesity but has lost a great deal of weight.  She is feeling better and has no recent symptoms of chest pain, chest pressure, edema, heart palpitations, or lightheadedness.  She has chronic shortness of breath and is on home O2.  She reports no recent change in her breathing.  Current Medications: Current Meds  Medication Sig   amoxicillin (AMOXIL) 500 MG capsule Take 500 mg by mouth 3 (three) times daily.   atorvastatin (LIPITOR) 80 MG tablet Take 1 tablet (80 mg total) by mouth every evening.   FIASP FLEXTOUCH 100 UNIT/ML FlexTouch  Pen Inject 10 Units into the skin 2 (two) times daily.   FLUoxetine (PROZAC) 40 MG capsule Take 40 mg by mouth daily.   furosemide (LASIX) 40 MG tablet Take 1 tablet by mouth once daily   ibuprofen (ADVIL) 800 MG tablet Take 800 mg by mouth 3 (three) times daily.   insulin degludec (TRESIBA) 100 UNIT/ML FlexTouch Pen Inject 20 Units into the skin daily.   JARDIANCE 25 MG TABS tablet Take 25 mg by mouth daily.   losartan (COZAAR) 25 MG tablet Take 1 tablet (25 mg total) by mouth daily.   metFORMIN (GLUCOPHAGE-XR) 500 MG 24 hr tablet Take 500 mg by mouth 3 (three) times daily.   Misc. Devices MISC Portable oxygen concentrator, 2L oxygen.  Diagnoses-chronic respiratory failure with hypoxia   nitroGLYCERIN (NITROSTAT) 0.4 MG SL tablet Dissolve 1 tablet under the tongue every 5 minutes as needed for chest pain. Max of 3 doses, then 911.   prasugrel (EFFIENT) 5 MG TABS tablet Take 1 tablet (5 mg total) by mouth daily.   pregabalin (LYRICA) 100 MG capsule Take 100 mg by mouth 3 (three) times daily.   [DISCONTINUED] aspirin 81 MG EC tablet Take 1 tablet (81 mg total) by mouth daily.   [DISCONTINUED] sacubitril-valsartan (ENTRESTO) 24-26 MG Take 1 tablet by mouth 2 (two) times daily.     Allergies:   Hydrocodone, Clopidogrel, and Clopidogrel bisulfate   ROS:   Please see  the history of present illness.    All other systems reviewed and are negative.  EKGs/Labs/Other Studies Reviewed:    The following studies were reviewed today: Cardiac Studies & Procedures   CARDIAC CATHETERIZATION  CARDIAC CATHETERIZATION 10/26/2019  Narrative  Prox Cx to Mid Cx lesion is 30% stenosed.  Prox LAD to Mid LAD lesion is 10% stenosed.  Mid LAD lesion is 100% stenosed.  1st Diag-1 lesion is 80% stenosed.  1st Diag-2 lesion is 70% stenosed.  A drug-eluting stent was successfully placed using a SYNERGY XD 2.50X28.  Post intervention, there is a 0% residual stenosis.  1. Acute anterior STEMI secondary to  occlusion of the mid LAD 2. Successful PTCA/DES x 1 mid LAD (overlapping with old stent) 3. 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 There is TIMI-3 flow down the Diagonal branch. 4. The Circumflex is a small caliber vessel with mild non-obstructive plaque 5. The RCA is a large dominant vessel with no obstructive disease  Recommendations: Will admit to the ICU. Continue Aggrastat infusion for 2 hours. Will continue DAPT with ASA and Effient for one year. Will start a high intensity statin. Will start Lopressor 25 mg po BID. Will need to review home medications and decide the best way to add in an Ace-inh or ARB. She may do best with Coreg for better BP control. Echo in am. Pharmacy to assist with dosing of insulin. Aggressive risk factor modification.  Findings Coronary Findings Diagnostic  Dominance: Right  Left Anterior Descending Vessel is large. Prox LAD to Mid LAD lesion is 10% stenosed. The lesion was previously treated using a stent (unknown type) over 2 years ago. Mid LAD lesion is 100% stenosed. The lesion is thrombotic.  First Diagonal Branch 1st Diag-1 lesion is 80% stenosed. The lesion was previously treated using a stent (unknown type) over 2 years ago. 1st Diag-2 lesion is 70% stenosed.  Left Circumflex Vessel is moderate in size. Prox Cx to Mid Cx lesion is 30% stenosed.  Right Coronary Artery Vessel is large.  Intervention  Mid LAD lesion Stent CATH VISTA GUIDE 6FR XBLAD3.5 guide catheter was inserted. Lesion crossed with guidewire using a WIRE COUGAR XT STRL 190CM. Pre-stent angioplasty was performed using a BALLOON SAPPHIRE 2.0X12. A drug-eluting stent was successfully placed using a SYNERGY XD 2.50X28. Stent strut is well apposed. Post-stent angioplasty was performed using a BALLOON Martorell EMERGE MR [id number]. Post-Intervention Lesion Assessment The intervention was successful. Pre-interventional TIMI flow is 0. Post-intervention TIMI  flow is 3. No complications occurred at this lesion. There is a 0% residual stenosis post intervention.   STRESS TESTS  MYOCARDIAL PERFUSION IMAGING 11/29/2021  Narrative   The study is normal. The study is low risk.   No ST deviation was noted.   LV perfusion is normal.   Left ventricular function is normal. Nuclear stress EF: 71 %. The left ventricular ejection fraction is hyperdynamic (>65%). End diastolic cavity size is normal. End systolic cavity size is normal.   Prior study not available for comparison.  Normal perfusion. LVEF 71% with normal wall motion. This is a low risk study. No prior for comparison.   ECHOCARDIOGRAM  ECHOCARDIOGRAM COMPLETE 02/08/2021  Narrative ECHOCARDIOGRAM REPORT    Patient Name:   Brenda [name] Date of Exam: 02/08/2021 Medical Rec #:  [id number]           Height:       62.0 in Accession #:    [id number]  Weight:       296.8 lb Date of Birth:  1960-11-02           BSA:          2.261 m Patient Age:    59 years            BP:           143/79 mmHg Patient Gender: F                   HR:           69 bpm. Exam Location:  Inpatient  Procedure: 2D Echo, Cardiac Doppler, Color Doppler and Intracardiac Opacification Agent  Indications:    I50.22 Chronic systolic (congestive) heart failure  History:        Patient has prior history of Echocardiogram examinations, most recent 10/26/2019. CAD; Risk Factors:Hypertension, Diabetes, Dyslipidemia and Sleep Apnea.  Sonographer:    Tiffany Dance Referring Phys: 74 DANIELLE RAY   Sonographer Comments: Technically difficult study due to poor echo windows, no subcostal window and patient is morbidly obese. Image acquisition challenging due to patient body habitus. IMPRESSIONS   1. Left ventricular ejection fraction, by estimation, is 60 to 65%. The left ventricle has normal function. The left ventricle has no regional wall motion abnormalities. 2. Right ventricular systolic function is  moderately reduced. The right ventricular size is mildly enlarged. 3. The mitral valve is normal in structure. No evidence of mitral valve regurgitation. No evidence of mitral stenosis. 4. The aortic valve is normal in structure. Aortic valve regurgitation is not visualized. No aortic stenosis is present. 5. The inferior vena cava is normal in size with greater than 50% respiratory variability, suggesting right atrial pressure of 3 mmHg.  FINDINGS Left Ventricle: Left ventricular ejection fraction, by estimation, is 60 to 65%. The left ventricle has normal function. The left ventricle has no regional wall motion abnormalities. Definity contrast agent was given IV to delineate the left ventricular endocardial borders. The left ventricular internal cavity size was normal in size. There is no left ventricular hypertrophy.  Right Ventricle: The right ventricular size is mildly enlarged. No increase in right ventricular wall thickness. Right ventricular systolic function is moderately reduced.  Left Atrium: Left atrial size was normal in size.  Right Atrium: Right atrial size was normal in size.  Pericardium: There is no evidence of pericardial effusion.  Mitral Valve: The mitral valve is normal in structure. No evidence of mitral valve regurgitation. No evidence of mitral valve stenosis.  Tricuspid Valve: The tricuspid valve is normal in structure. Tricuspid valve regurgitation is not demonstrated. No evidence of tricuspid stenosis.  Aortic Valve: The aortic valve is normal in structure. Aortic valve regurgitation is not visualized. No aortic stenosis is present.  Pulmonic Valve: The pulmonic valve was normal in structure. Pulmonic valve regurgitation is not visualized. No evidence of pulmonic stenosis.  Aorta: The aortic root is normal in size and structure.  Venous: The inferior vena cava is normal in size with greater than 50% respiratory variability, suggesting right atrial pressure of 3  mmHg.  IAS/Shunts: No atrial level shunt detected by color flow Doppler.   LEFT VENTRICLE PLAX 2D LVIDd:         5.20 cm LVIDs:         3.50 cm LV PW:         1.90 cm LV IVS:        1.10 cm LVOT diam:     2.00  cm LV SV:         51 LV SV Index:   22 LVOT Area:     3.14 cm   RIGHT VENTRICLE RV S prime:     7.95 cm/s TAPSE (M-mode): 2.2 cm  LEFT ATRIUM         Index LA diam:    5.00 cm 2.21 cm/m AORTIC VALVE LVOT Vmax:   73.20 cm/s LVOT Vmean:  53.600 cm/s LVOT VTI:    0.161 m  AORTA Ao Root diam: 3.10 cm Ao Asc diam:  3.50 cm  MITRAL VALVE MV Area (PHT): 2.44 cm     SHUNTS MV Decel Time: 312 msec     Systemic VTI:  0.16 m MV E velocity: 123.50 cm/s  Systemic Diam: 2.00 cm MV A velocity: 99.75 cm/s MV E/A ratio:  1.24  Donato Schultz MD Electronically signed by Donato Schultz MD Signature Date/Time: 02/08/2021/4:40:13 PM    Final             EKG:        Recent Labs: 04/19/2023: ALT 25; B Natriuretic Peptide 24.2; BUN 12; Creatinine, Ser 0.91; Hemoglobin 14.8; Magnesium 1.6; Platelets 307; Potassium 3.0; Sodium 134  Recent Lipid Panel    Component Value Date/Time   CHOL 108 01/25/2023 0945   TRIG 130 01/25/2023 0945   HDL 45 01/25/2023 0945   CHOLHDL 2.4 01/25/2023 0945   CHOLHDL 2.8 11/23/2021 0835   VLDL 25 11/23/2021 0835   LDLCALC 40 01/25/2023 0945   LDLDIRECT 135.9 02/22/2011 1132          Physical Exam:    VS:  BP (!) 92/58 (BP Location: Right Arm, Patient Position: Sitting, Cuff Size: Normal)   Pulse 80   Resp 16   Ht 5\' 2"  (1.575 m)   Wt 203 lb 9.6 oz (92.4 kg)   LMP 10/30/2014 (Approximate)   SpO2 98% Comment: 2L  BMI 37.24 kg/m     Wt Readings from Last 3 Encounters:  07/19/23 203 lb 9.6 oz (92.4 kg)  06/07/23 220 lb 12.8 oz (100.2 kg)  04/01/23 214 lb (97.1 kg)     GEN:  Well nourished, well developed in no acute distress HEENT: Normal NECK: No JVD; No carotid bruits LYMPHATICS: No lymphadenopathy CARDIAC: RRR, no murmurs,  rubs, gallops RESPIRATORY:  Clear to auscultation without rales, wheezing or rhonchi  ABDOMEN: Soft, non-tender, non-distended MUSCULOSKELETAL:  No edema; No deformity  SKIN: Warm and dry NEUROLOGIC:  Alert and oriented x 3 PSYCHIATRIC:  Normal affect   Assessment & Plan Chronic heart failure with preserved ejection fraction (HCC) The patient is clinically stable with no signs or symptoms of volume overload on exam.  She remains on furosemide, and Jardiance.  With normal LVEF and low blood pressure, we will stop Entresto and start losartan at low-dose.  If her blood pressure remains low or she becomes more symptomatic, will need to back off on further medicine.  I suspect her marked weight loss has reduced her antihypertensive requirements.  With her history of RV dysfunction and chronic O2, will update an echocardiogram. Coronary artery disease involving native coronary artery of native heart without angina pectoris Continue prasugrel.  Stop aspirin. Hyperlipidemia associated with type 2 diabetes mellitus (HCC) Treated with atorvastatin 80 mg daily.  Goal LDL less than 70.  Last lipids with a cholesterol of 108 and LDL of 40. Essential hypertension Blood pressure is low today.  See above      Medication Adjustments/Labs and Tests  Ordered: Current medicines are reviewed at length with the patient today.  Concerns regarding medicines are outlined above.  Orders Placed This Encounter  Procedures   ECHOCARDIOGRAM COMPLETE   Meds ordered this encounter  Medications   losartan (COZAAR) 25 MG tablet    Sig: Take 1 tablet (25 mg total) by mouth daily.    Dispense:  90 tablet    Refill:  3    Stopping Entresto    Patient Instructions  Medication Instructions:  STOP Entresto STOP Aspirin START Losartan 25mg  daily *If you need a refill on your cardiac medications before your next appointment, please call your pharmacy*  Testing: ECHO Your physician has requested that you have an  echocardiogram. Echocardiography is a painless test that uses sound waves to create images of your heart. It provides your doctor with information about the size and shape of your heart and how well your heart's chambers and valves are working. This procedure takes approximately one hour. There are no restrictions for this procedure. Please do NOT wear cologne, perfume, aftershave, or lotions (deodorant is allowed). Please arrive 15 minutes prior to your appointment time.  Please note: We ask at that you not bring children with you during ultrasound (echo/ vascular) testing. Due to room size and safety concerns, children are not allowed in the ultrasound rooms during exams. Our front office staff cannot provide observation of children in our lobby area while testing is being conducted. An adult accompanying a patient to their appointment will only be allowed in the ultrasound room at the discretion of the ultrasound technician under special circumstances. We apologize for any inconvenience.  Follow-Up: At [facility], you and your health needs are our priority.  As part of our continuing mission to provide you with exceptional heart care, we have created designated Provider Care Teams.  These Care Teams include your primary Cardiologist (physician) and Advanced Practice Providers (APPs -  Physician Assistants and Nurse Practitioners) who all work together to provide you with the care you need, when you need it.  Your next appointment:   6 month(s)  Provider:   Eligha Bridegroom, Tereso Newcomer, Jari Favre, or Robin Searing, NP     Signed, Tonny Bollman, MD  07/22/2023 8:34 AM    [facility]

## 2023-07-19 ENCOUNTER — Ambulatory Visit: Payer: Medicaid Other | Attending: Cardiovascular Disease | Admitting: Cardiovascular Disease

## 2023-07-19 ENCOUNTER — Encounter: Payer: Self-pay | Admitting: Cardiovascular Disease

## 2023-07-19 VITALS — BP 92/58 | HR 80 | Resp 16 | Ht 62.0 in | Wt 203.6 lb

## 2023-07-19 DIAGNOSIS — I10 Essential (primary) hypertension: Secondary | ICD-10-CM | POA: Diagnosis not present

## 2023-07-19 DIAGNOSIS — E1169 Type 2 diabetes mellitus with other specified complication: Secondary | ICD-10-CM | POA: Diagnosis not present

## 2023-07-19 DIAGNOSIS — I5032 Chronic diastolic (congestive) heart failure: Secondary | ICD-10-CM | POA: Diagnosis not present

## 2023-07-19 DIAGNOSIS — I2510 Atherosclerotic heart disease of native coronary artery without angina pectoris: Secondary | ICD-10-CM | POA: Diagnosis not present

## 2023-07-19 DIAGNOSIS — E785 Hyperlipidemia, unspecified: Secondary | ICD-10-CM

## 2023-07-19 MED ORDER — LOSARTAN POTASSIUM 25 MG PO TABS: 25.0000 mg | ORAL_TABLET | Freq: Every day | ORAL | 3 refills | Status: DC

## 2023-07-19 NOTE — Patient Instructions (Addendum)
Medication Instructions:  STOP Entresto STOP Aspirin START Losartan 25mg  daily *If you need a refill on your cardiac medications before your next appointment, please call your pharmacy*  Testing: ECHO Your physician has requested that you have an echocardiogram. Echocardiography is a painless test that uses sound waves to create images of your heart. It provides your doctor with information about the size and shape of your heart and how well your heart's chambers and valves are working. This procedure takes approximately one hour. There are no restrictions for this procedure. Please do NOT wear cologne, perfume, aftershave, or lotions (deodorant is allowed). Please arrive 15 minutes prior to your appointment time.  Please note: We ask at that you not bring children with you during ultrasound (echo/ vascular) testing. Due to room size and safety concerns, children are not allowed in the ultrasound rooms during exams. Our front office staff cannot provide observation of children in our lobby area while testing is being conducted. An adult accompanying a patient to their appointment will only be allowed in the ultrasound room at the discretion of the ultrasound technician under special circumstances. We apologize for any inconvenience.  Follow-Up: At [facility], you and your health needs are our priority.  As part of our continuing mission to provide you with exceptional heart care, we have created designated Provider Care Teams.  These Care Teams include your primary Cardiologist (physician) and Advanced Practice Providers (APPs -  Physician Assistants and Nurse Practitioners) who all work together to provide you with the care you need, when you need it.  Your next appointment:   6 month(s)  Provider:   Eligha Bridegroom, Tereso Newcomer, Jari Favre, or Robin Searing, NP

## 2023-07-22 ENCOUNTER — Encounter: Payer: Self-pay | Admitting: Cardiovascular Disease

## 2023-07-22 DIAGNOSIS — E611 Iron deficiency: Secondary | ICD-10-CM | POA: Insufficient documentation

## 2023-07-22 DIAGNOSIS — Z79899 Other long term (current) drug therapy: Secondary | ICD-10-CM | POA: Insufficient documentation

## 2023-07-22 NOTE — Assessment & Plan Note (Signed)
Treated with atorvastatin 80 mg daily.  Goal LDL less than 70.  Last lipids with a cholesterol of 108 and LDL of 40.

## 2023-07-22 NOTE — Assessment & Plan Note (Signed)
The patient is clinically stable with no signs or symptoms of volume overload on exam.  She remains on furosemide, and Jardiance.  With normal LVEF and low blood pressure, we will stop Entresto and start losartan at low-dose.  If her blood pressure remains low or she becomes more symptomatic, will need to back off on further medicine.  I suspect her marked weight loss has reduced her antihypertensive requirements.  With her history of RV dysfunction and chronic O2, will update an echocardiogram.

## 2023-07-22 NOTE — Assessment & Plan Note (Signed)
Blood pressure is low today.  See above

## 2023-07-22 NOTE — Assessment & Plan Note (Signed)
Continue prasugrel.  Stop aspirin.

## 2023-07-25 DIAGNOSIS — Z789 Other specified health status: Secondary | ICD-10-CM | POA: Insufficient documentation

## 2023-07-25 DIAGNOSIS — E559 Vitamin D deficiency, unspecified: Secondary | ICD-10-CM | POA: Insufficient documentation

## 2023-07-29 DIAGNOSIS — R81 Glycosuria: Secondary | ICD-10-CM | POA: Insufficient documentation

## 2023-08-05 ENCOUNTER — Other Ambulatory Visit: Payer: Self-pay | Admitting: Physician Assistant

## 2023-08-05 DIAGNOSIS — E041 Nontoxic single thyroid nodule: Secondary | ICD-10-CM

## 2023-08-09 ENCOUNTER — Ambulatory Visit
Admission: RE | Admit: 2023-08-09 | Discharge: 2023-08-09 | Disposition: A | Discharge status: Home / Self Care | Payer: Medicaid Other | Source: Ambulatory Visit | Attending: Physician Assistant

## 2023-08-09 DIAGNOSIS — E041 Nontoxic single thyroid nodule: Secondary | ICD-10-CM

## 2023-08-23 ENCOUNTER — Ambulatory Visit (HOSPITAL_COMMUNITY): Payer: Medicaid Other | Attending: Cardiology

## 2023-08-23 DIAGNOSIS — I5032 Chronic diastolic (congestive) heart failure: Secondary | ICD-10-CM | POA: Insufficient documentation

## 2023-08-23 LAB — ECHOCARDIOGRAM COMPLETE
AR max vel: 1.96 cm2
AV Area VTI: 1.98 cm2
AV Area mean vel: 1.9 cm2
AV Mean grad: 9.6 mm[Hg]
AV Peak grad: 16.7 mm[Hg]
Ao pk vel: 2.04 m/s
Calc EF: 61.8 %
S' Lateral: 2.78 cm
Single Plane A2C EF: 63.2 %
Single Plane A4C EF: 60.4 %

## 2023-09-09 ENCOUNTER — Ambulatory Visit
Admission: RE | Admit: 2023-09-09 | Discharge: 2023-09-09 | Disposition: A | Discharge status: Home / Self Care | Payer: Medicaid Other | Source: Ambulatory Visit | Attending: Radiation Oncology | Admitting: Radiation Oncology

## 2023-09-09 ENCOUNTER — Telehealth: Payer: Self-pay | Admitting: *Deleted

## 2023-09-09 NOTE — Telephone Encounter (Signed)
 RETURNED PATIENT'S PHONE CALL, ASKED PATIENT ABOUT RESCHEDULING MISSED FU APPT. WITH DR. KINARD, PATIENT AGREED TO COME ON 09-12-23 @ 8:30 AM

## 2023-09-11 ENCOUNTER — Other Ambulatory Visit: Payer: Self-pay

## 2023-09-11 NOTE — Progress Notes (Signed)
 Radiation Oncology         [phone]) [phone] ________________________________  Name: Brenda [name] MRN: [id number]  Date: 09/12/2023  DOB: [birth year]  Follow-Up Visit Note  CC: Tann, Samandra, NP  Delbert Clam, MD    ICD-10-CM   1. 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 She was last seen here for follow-up on 04/01/23.    During her most recent follow up visit with Dr. Viktoria on 06/07/23, she denied any symptoms concerning for disease recurrence and she was noted as NED on examination.    No other significant oncologic interval history since the patient was last seen.   Of note: she did presented to the ED on 04/19/23 with SOB, fatigue, and nausea x 3 weeks after having an abnormal EKG at her PCP's office. (She does have baseline SOB and endorsed a mild increase in SOB in the ED). Her ED work-up including a repeat EKG, chest x-ray, and labs were unremarkable other than mild hypokalemia, hypomagnesemia, and mildly low bicarb consistent with dehydration. Her symptoms also seemed to resolve on their own in the ED, and based upon this and her unremarkable work-up, she was discharged home with instructions to follow-up with her PCP for further management.    She also recently presented for a thyroid  US  on 08/09/23 (for surveillance of several thyroid  nodules) which showed a minimal decrease in size of a right thyroid  nodule and no change in size of a left thyroid  nodule. Both of these nodules no longer warrant  surveillance imaging or further examination.                 Patient reports to be doing well overall today. She denies any abdominal pain, nausea, vaginal discharge, urinary issues, or blood in her urine or stool. She has experienced some abdominal bloating since being on Ozempic . She has noticed this improving since she stopped taking Ozempic  in September of 2024. She reports using her dilators 2-3x/week.                Allergies:  is allergic to hydrocodone, clopidogrel, and clopidogrel bisulfate.  Meds: Current Outpatient Medications  Medication Sig Dispense Refill   amoxicillin (AMOXIL) 500 MG capsule Take 500 mg by mouth 3 (three) times daily.     atorvastatin  (LIPITOR ) 80 MG tablet Take 1 tablet (80 mg total) by mouth every evening. 30 tablet 6   FIASP  FLEXTOUCH 100 UNIT/ML FlexTouch Pen Inject 10 Units into the skin 2 (two) times daily.     FLUoxetine  (PROZAC ) 40 MG capsule Take 40 mg by mouth daily.     furosemide  (LASIX ) 40 MG tablet Take 1 tablet by mouth once daily 30 tablet 0   ibuprofen  (ADVIL ) 800 MG tablet Take 800 mg by mouth 3 (three) times daily.     insulin  degludec (TRESIBA ) 100 UNIT/ML FlexTouch Pen Inject 20 Units into the skin daily.     JARDIANCE  25 MG TABS tablet Take 25 mg by mouth daily.     losartan  (COZAAR ) 25 MG  tablet Take 1 tablet (25 mg total) by mouth daily. 90 tablet 3   metFORMIN  (GLUCOPHAGE -XR) 500 MG 24 hr tablet Take 500 mg by mouth 3 (three) times daily.     Misc. Devices MISC Portable oxygen  concentrator, 2L oxygen .  Diagnoses-chronic respiratory failure with hypoxia 1 each 0   nitroGLYCERIN  (NITROSTAT ) 0.4 MG SL tablet Dissolve 1 tablet under the tongue every 5 minutes as needed for chest pain. Max of 3 doses, then 911. 25 tablet 6   prasugrel  (EFFIENT ) 5 MG TABS tablet Take 1 tablet (5 mg total) by mouth daily. 90 tablet 3   pregabalin  (LYRICA ) 100 MG capsule Take 100 mg by mouth 3 (three) times daily.     No current facility-administered  medications for this encounter.    Physical Findings: The patient is in no acute distress. Patient is alert and oriented.  weight is 209 lb 6.4 oz (95 kg). Her oral temperature is 98 F (36.7 C). Her blood pressure is 155/60 (abnormal) and her pulse is 63. Her respiration is 19 and oxygen  saturation is 100%. .  No significant changes. Lungs are clear to auscultation bilaterally. Heart has regular rate and rhythm. No palpable cervical, supraclavicular, or axillary adenopathy. Abdomen soft, non-tender, normal bowel sounds. On oxygen .   On pelvic examination the external genitalia were unremarkable. A speculum exam was performed. There are no mucosal lesions noted in the vaginal vault.  On bimanual and rectovaginal examination there were no pelvic masses appreciated.  Vaginal cuff intact.  Mild radiation changes noted at the cuff.     Lab Findings: Lab Results  Component Value Date   WBC 9.4 04/19/2023   HGB 14.8 04/19/2023   HCT 45.2 04/19/2023   MCV 87.9 04/19/2023   PLT 307 04/19/2023    Radiographic Findings: ECHOCARDIOGRAM COMPLETE Result Date: 08/23/2023    ECHOCARDIOGRAM REPORT   Patient Name:   Brenda [name] Date of Exam: 08/23/2023 Medical Rec #:  [id number]           Height:       62.0 in Accession #:    [id number]          Weight:       203.6 lb Date of Birth:  1961/05/06           BSA:          1.926 m Patient Age:    62 years            BP:           92/58 mmHg Patient Gender: F                   HR:           61 bpm. Exam Location:  [address] Procedure: 2D Echo, Cardiac Doppler and Color Doppler Indications:    Chronic heart failure with preserved ejection fraction (HCC)                 [[id number]]  History:        Patient has prior history of Echocardiogram examinations, most                 recent 02/08/2021. CHF, CAD and Previous Myocardial Infarction;                 Risk Factors:Sleep Apnea, Diabetes and Hypertension.  Sonographer:    Lauraine Pilot RDCS Referring Phys:  [phone] MICHAEL COOPER IMPRESSIONS  1. Left ventricular ejection fraction, by estimation, is  60 to 65%. Left ventricular ejection fraction by 2D MOD biplane is 61.8 %. The left ventricle has normal function. The left ventricle has no regional wall motion abnormalities. There is mild left ventricular hypertrophy. Left ventricular diastolic parameters are consistent with Grade II diastolic dysfunction (pseudonormalization). Elevated left ventricular end-diastolic pressure. The E/e' is 24.  2. Right ventricular systolic function is mildly reduced. The right ventricular size is normal. There is normal pulmonary artery systolic pressure. The estimated right ventricular systolic pressure is 25.7 mmHg.  3. The mitral valve is abnormal. Trivial mitral valve regurgitation. Moderate mitral annular calcification.  4. The aortic valve is tricuspid. Aortic valve regurgitation is not visualized. Aortic valve area, by VTI measures 1.98 cm. Aortic valve mean gradient measures 9.6 mmHg. Aortic valve Vmax measures 2.04 m/s.  5. The inferior vena cava is normal in size with greater than 50% respiratory variability, suggesting right atrial pressure of 3 mmHg. Comparison(s): Changes from prior study are noted. 02/08/2021: LVEF 60-65%, moderate RV systolic dysfunction. FINDINGS  Left Ventricle: Left ventricular ejection fraction, by estimation, is 60 to 65%. Left ventricular ejection fraction by 2D MOD biplane is 61.8 %. The left ventricle has normal function. The left ventricle has no regional wall motion abnormalities. The left ventricular internal cavity size was normal in size. There is mild left ventricular hypertrophy. Left ventricular diastolic parameters are consistent with Grade II diastolic dysfunction (pseudonormalization). Elevated left ventricular end-diastolic pressure. The E/e' is 24. Right Ventricle: The right ventricular size is normal. No increase in right ventricular wall thickness. Right ventricular systolic function is  mildly reduced. There is normal pulmonary artery systolic pressure. The tricuspid regurgitant velocity is 2.38 m/s, and with an assumed right atrial pressure of 3 mmHg, the estimated right ventricular systolic pressure is 25.7 mmHg. Left Atrium: Left atrial size was normal in size. Right Atrium: Right atrial size was normal in size. Pericardium: There is no evidence of pericardial effusion. Mitral Valve: The mitral valve is abnormal. Moderate mitral annular calcification. Trivial mitral valve regurgitation. Tricuspid Valve: The tricuspid valve is grossly normal. Tricuspid valve regurgitation is trivial. Aortic Valve: The aortic valve is tricuspid. Aortic valve regurgitation is not visualized. Aortic valve mean gradient measures 9.6 mmHg. Aortic valve peak gradient measures 16.7 mmHg. Aortic valve area, by VTI measures 1.98 cm. Pulmonic Valve: The pulmonic valve was grossly normal. Pulmonic valve regurgitation is trivial. Aorta: The aortic root and ascending aorta are structurally normal, with no evidence of dilitation. Venous: The inferior vena cava is normal in size with greater than 50% respiratory variability, suggesting right atrial pressure of 3 mmHg. IAS/Shunts: No atrial level shunt detected by color flow Doppler.  LEFT VENTRICLE PLAX 2D                        Biplane EF (MOD) LVIDd:         4.63 cm         LV Biplane EF:   Left LVIDs:         2.78 cm                          ventricular LV PW:         1.16 cm                          ejection LV IVS:        1.06 cm  fraction by LVOT diam:     1.90 cm                          2D MOD LV SV:         105                              biplane is LV SV Index:   55                               61.8 %. LVOT Area:     2.84 cm                                Diastology                                LV e' medial:    5.33 cm/s LV Volumes (MOD)               LV E/e' medial:  28.9 LV vol d, MOD    73.4 ml       LV e' lateral:   7.28 cm/s A2C:                            LV E/e' lateral: 21.2 LV vol d, MOD    70.2 ml A4C: LV vol s, MOD    27.0 ml A2C: LV vol s, MOD    27.8 ml A4C: LV SV MOD A2C:   46.4 ml LV SV MOD A4C:   70.2 ml LV SV MOD BP:    44.6 ml RIGHT VENTRICLE RV S prime:     9.49 cm/s TAPSE (M-mode): 2.3 cm LEFT ATRIUM             Index        RIGHT ATRIUM           Index LA diam:        4.60 cm 2.39 cm/m   RA Area:     11.60 cm LA Vol (A2C):   46.7 ml 24.24 ml/m  RA Volume:   23.40 ml  12.15 ml/m LA Vol (A4C):   48.8 ml 25.33 ml/m LA Biplane Vol: 50.2 ml 26.06 ml/m  AORTIC VALVE AV Area (Vmax):    1.96 cm AV Area (Vmean):   1.90 cm AV Area (VTI):     1.98 cm AV Vmax:           204.25 cm/s AV Vmean:          145.200 cm/s AV VTI:            0.532 m AV Peak Grad:      16.7 mmHg AV Mean Grad:      9.6 mmHg LVOT Vmax:         141.00 cm/s LVOT Vmean:        97.200 cm/s LVOT VTI:          0.371 m LVOT/AV VTI ratio: 0.70  AORTA Ao Root diam: 3.00 cm Ao Asc diam:  3.40 cm MV E velocity: 154.00 cm/s  TRICUSPID VALVE MV A velocity: 94.00 cm/s   TR Peak grad:   22.7 mmHg MV  E/A ratio:  1.64         TR Vmax:        238.00 cm/s                              SHUNTS                             Systemic VTI:  0.37 m                             Systemic Diam: 1.90 cm Vinie Maxcy MD Electronically signed by Vinie Maxcy MD Signature Date/Time: 08/23/2023/7:07:05 PM    Final     Impression:  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 No evidence of disease recurrence on clinical exam today.   Plan:  Per NCCN guidelines, patient will follow up with Dr. Viktoria in 3 months and radiation oncology in 6 months. Patient was educated on signs/symptoms that may indicate disease recurrence and understands to notify us  if she experiences any of them.     30 minutes of total time was spent for this patient encounter,  including preparation, face-to-face counseling with the patient and coordination of care, physical exam, and documentation of the encounter. ____________________________________   Leeroy Due, PA-C   Lynwood CHARM Nasuti, PhD, MD   [facility]  Radiation Oncology Direct Dial: [phone]  Fax: [phone] [email or web address]    This document serves as a record of services personally performed by Lynwood Nasuti, MD and Leeroy Due, PA-C. It was created on his behalf by Dorthy Fuse, a trained medical scribe. The creation of this record is based on the scribe's personal observations and the provider's statements to them. This document has been checked and approved by the attending provider.

## 2023-09-12 ENCOUNTER — Ambulatory Visit
Admission: RE | Admit: 2023-09-12 | Discharge: 2023-09-12 | Disposition: A | Discharge status: Home / Self Care | Payer: Medicaid Other | Source: Ambulatory Visit | Attending: Radiation Oncology | Admitting: Radiation Oncology

## 2023-09-12 ENCOUNTER — Encounter: Payer: Self-pay | Admitting: Radiation Oncology

## 2023-09-12 VITALS — BP 155/60 | HR 63 | Temp 98.0°F | Resp 19 | Wt 209.4 lb

## 2023-09-12 DIAGNOSIS — Z79899 Other long term (current) drug therapy: Secondary | ICD-10-CM | POA: Insufficient documentation

## 2023-09-12 DIAGNOSIS — Z923 Personal history of irradiation: Secondary | ICD-10-CM | POA: Insufficient documentation

## 2023-09-12 DIAGNOSIS — Z8541 Personal history of malignant neoplasm of cervix uteri: Secondary | ICD-10-CM | POA: Insufficient documentation

## 2023-09-12 DIAGNOSIS — Z7902 Long term (current) use of antithrombotics/antiplatelets: Secondary | ICD-10-CM | POA: Diagnosis not present

## 2023-09-12 DIAGNOSIS — C541 Malignant neoplasm of endometrium: Secondary | ICD-10-CM

## 2023-09-12 DIAGNOSIS — Z90710 Acquired absence of both cervix and uterus: Secondary | ICD-10-CM | POA: Insufficient documentation

## 2023-09-12 NOTE — Progress Notes (Signed)
 Brenda [name] is here today for follow up post radiation to the pelvic.  They completed their radiation on: 03/07/2022  Does the patient complain of any of the following:  Pain:Denies Abdominal bloating: She reports abdominal bloating. Diarrhea/Constipation: Yes Nausea/Vomiting: Denies Vaginal Discharge: Denies Blood in Urine or Stool: Denies Urinary Issues (dysuria/incomplete emptying/ incontinence/ increased frequency/urgency): Denies Does patient report using vaginal dilator 2-3 times a week and/or sexually active 2-3 weeks: She reports using dilators twice a week. Post radiation skin changes: Denies   Additional comments if applicable: She wants to be educated on if the cancer could reoccur and if so what are the symptoms.  BP (!) 155/60 (BP Location: Right Arm, Patient Position: Sitting)   Pulse 63   Temp 98 F (36.7 C) (Oral)   Resp 19   Wt 209 lb 6.4 oz (95 kg)   LMP 10/30/2014 (Approximate)   SpO2 100%   PF (!) 2 L/min   BMI 38.30 kg/m

## 2023-09-17 ENCOUNTER — Other Ambulatory Visit: Payer: Self-pay | Admitting: Nurse Practitioner

## 2023-09-17 DIAGNOSIS — Z0000 Encounter for general adult medical examination without abnormal findings: Secondary | ICD-10-CM

## 2023-09-17 DIAGNOSIS — Z9981 Dependence on supplemental oxygen: Secondary | ICD-10-CM | POA: Insufficient documentation

## 2023-09-25 ENCOUNTER — Encounter: Payer: Self-pay | Admitting: Skilled Nursing Facility1

## 2023-09-25 ENCOUNTER — Encounter: Payer: Medicaid Other | Attending: Registered Nurse | Admitting: Skilled Nursing Facility1

## 2023-09-25 VITALS — Ht 62.0 in | Wt 213.6 lb

## 2023-09-25 DIAGNOSIS — Z794 Long term (current) use of insulin: Secondary | ICD-10-CM | POA: Insufficient documentation

## 2023-09-25 DIAGNOSIS — E118 Type 2 diabetes mellitus with unspecified complications: Secondary | ICD-10-CM | POA: Insufficient documentation

## 2023-09-25 NOTE — Progress Notes (Signed)
Other Dx: Anemia Uterin Cancer Hypercholesterolemia  Sleep apnea HTN On Oxygen  MI  DM medications: Jardiance  Metformin Insulin: Fiasp 5 units before each meal (forgetting to take it before so take sit after) Tresiba 20 units: CGM TIR 45%, 23% 181-250, 31% 250, 54-69 1%  GMI 8.2   Pt states she did not have insurance for many years leading to her DM being out of control. Pt states she did try Ozempic but with stomach issues got off it. Pt states she is an emotional eater stating she is an "obsessive eater". Pt states she tries to keep trigger foods out of the house but does live with a teenager so finds that difficult. Discussed in the appointment. Where should I pivot when recognizing I am about to emotionally eat. Pt states she has daughters that are her support.   Diabetes Self-Management Education  Visit Type: First/Initial  Appt. Start Time: 10:30 Appt. End Time: 11:30  09/30/2023  [name], identified by name and date of birth, is a 63 y.o. female with a diagnosis of Diabetes: Type 2.   ASSESSMENT  Height 5\' 2"  (1.575 m), weight 213 lb 9.6 oz (96.9 kg), last menstrual period 10/30/2014. Body mass index is 39.07 kg/m.   Diabetes Self-Management Education - 09/25/23 1053       Visit Information   Visit Type First/Initial      Initial Visit   Diabetes Type Type 2    Are you currently following a meal plan? No    Are you taking your medications as prescribed? Yes      Psychosocial Assessment   Patient Belief/Attitude about Diabetes Other (comment)    What is the hardest part about your diabetes right now, causing you the most concern, or is the most worrisome to you about your diabetes?   Making healty food and beverage choices    Self-care barriers Unable to determine    Self-management support Friends    Patient Concerns Nutrition/Meal planning    Special Needs None    Preferred Learning Style Visual;Auditory    Learning Readiness Contemplating     How often do you need to have someone help you when you read instructions, pamphlets, or other written materials from your doctor or pharmacy? 1 - Never      Pre-Education Assessment   Patient understands the diabetes disease and treatment process. Needs Instruction    Patient understands incorporating nutritional management into lifestyle. Needs Instruction    Patient undertands incorporating physical activity into lifestyle. Needs Instruction    Patient understands using medications safely. Needs Instruction    Patient understands monitoring blood glucose, interpreting and using results Needs Instruction    Patient understands prevention, detection, and treatment of acute complications. Needs Instruction    Patient understands prevention, detection, and treatment of chronic complications. Needs Instruction    Patient understands how to develop strategies to address psychosocial issues. Needs Instruction    Patient understands how to develop strategies to promote health/change behavior. Needs Instruction      Complications   How often do you check your blood sugar? > 4 times/day    Postprandial Blood glucose range (mg/dL) >528    Number of hypoglycemic episodes per month 0    Have you had a dilated eye exam in the past 12 months? Yes    Have you had a dental exam in the past 12 months? Yes    Are you checking your feet? No      Dietary  Intake   Breakfast skipped      Activity / Exercise   Activity / Exercise Type ADL's    How many days per week do you exercise? 0    How many minutes per day do you exercise? 0    Total minutes per week of exercise 0      Patient Education   Previous Diabetes Education No    Disease Pathophysiology Definition of diabetes, type 1 and 2, and the diagnosis of diabetes;Factors that contribute to the development of diabetes    Healthy Eating Meal options for control of blood glucose level and chronic complications.;Information on hints to eating out and  maintain blood glucose control.;Meal timing in regards to the patients' current diabetes medication.;Carbohydrate counting;Plate Method;Role of diet in the treatment of diabetes and the relationship between the three main macronutrients and blood glucose level    Being Active Role of exercise on diabetes management, blood pressure control and cardiac health.    Medications Taught/reviewed insulin/injectables, injection, site rotation, insulin/injectables storage and needle disposal.;Reviewed patients medication for diabetes, action, purpose, timing of dose and side effects.    Monitoring Taught/evaluated CGM (comment)    Acute complications Taught prevention, symptoms, and  treatment of hypoglycemia - the 15 rule.;Discussed and identified patients' prevention, symptoms, and treatment of hyperglycemia.    Chronic complications Dental care;Retinopathy and reason for yearly dilated eye exams;Nephropathy, what it is, prevention of, the use of ACE, ARB's and early detection of through urine microalbumia.;Lipid levels, blood glucose control and heart disease;Assessed and discussed foot care and prevention of foot problems    Diabetes Stress and Support Role of stress on diabetes;Identified and addressed patients feelings and concerns about diabetes;Worked with patient to identify barriers to care and solutions    Lifestyle and Health Coping Lifestyle issues that need to be addressed for better diabetes care      Individualized Goals (developed by patient)   Nutrition General guidelines for healthy choices and portions discussed;Follow meal plan discussed    Physical Activity Exercise 5-7 days per week;15 minutes per day    Medications take my medication as prescribed    Problem Solving Eating Pattern;Medication consistency      Post-Education Assessment   Patient understands the diabetes disease and treatment process. Comprehends key points    Patient understands incorporating nutritional management into  lifestyle. Comprehends key points    Patient undertands incorporating physical activity into lifestyle. Comprehends key points    Patient understands using medications safely. Comphrehends key points    Patient understands monitoring blood glucose, interpreting and using results Comprehends key points    Patient understands prevention, detection, and treatment of acute complications. Comprehends key points    Patient understands prevention, detection, and treatment of chronic complications. Comprehends key points    Patient understands how to develop strategies to address psychosocial issues. Comprehends key points    Patient understands how to develop strategies to promote health/change behavior. Comprehends key points      Outcomes   Expected Outcomes Demonstrated interest in learning but significant barriers to change    Future DMSE 4-6 wks    Program Status Completed             Individualized Plan for Diabetes Self-Management Training:   Learning Objective:  Patient will have a greater understanding of diabetes self-management. Patient education plan is to attend individual and/or group sessions per assessed needs and concerns.    Expected Outcomes:  Demonstrated interest in learning but significant barriers to  change  Education material provided: ADA - How to Thrive: A Guide for Your Journey with Diabetes, Food label handouts, My Plate, Snack sheet, and Diabetes Resources  If problems or questions, patient to contact team via:  Phone and Email  Future DSME appointment: 4-6 wks

## 2023-09-30 DIAGNOSIS — R296 Repeated falls: Secondary | ICD-10-CM | POA: Insufficient documentation

## 2023-09-30 DIAGNOSIS — Z7984 Long term (current) use of oral hypoglycemic drugs: Secondary | ICD-10-CM | POA: Insufficient documentation

## 2023-09-30 DIAGNOSIS — E1142 Type 2 diabetes mellitus with diabetic polyneuropathy: Secondary | ICD-10-CM | POA: Insufficient documentation

## 2023-10-03 ENCOUNTER — Ambulatory Visit
Admission: RE | Admit: 2023-10-03 | Discharge: 2023-10-03 | Disposition: A | Discharge status: Home / Self Care | Payer: Medicaid Other | Source: Ambulatory Visit | Attending: Nurse Practitioner | Admitting: Nurse Practitioner

## 2023-10-03 DIAGNOSIS — Z0000 Encounter for general adult medical examination without abnormal findings: Secondary | ICD-10-CM

## 2023-10-26 ENCOUNTER — Emergency Department (HOSPITAL_BASED_OUTPATIENT_CLINIC_OR_DEPARTMENT_OTHER): Payer: Medicaid Other

## 2023-10-26 ENCOUNTER — Emergency Department (HOSPITAL_BASED_OUTPATIENT_CLINIC_OR_DEPARTMENT_OTHER)
Admission: EM | Admit: 2023-10-26 | Discharge: 2023-10-26 | Disposition: A | Discharge status: Home / Self Care | Payer: Medicaid Other | Attending: Emergency Medicine | Admitting: Emergency Medicine

## 2023-10-26 ENCOUNTER — Encounter (HOSPITAL_BASED_OUTPATIENT_CLINIC_OR_DEPARTMENT_OTHER): Payer: Self-pay | Admitting: Emergency Medicine

## 2023-10-26 DIAGNOSIS — Z794 Long term (current) use of insulin: Secondary | ICD-10-CM | POA: Diagnosis not present

## 2023-10-26 DIAGNOSIS — I2510 Atherosclerotic heart disease of native coronary artery without angina pectoris: Secondary | ICD-10-CM | POA: Diagnosis not present

## 2023-10-26 DIAGNOSIS — I10 Essential (primary) hypertension: Secondary | ICD-10-CM | POA: Diagnosis not present

## 2023-10-26 DIAGNOSIS — S61213A Laceration without foreign body of left middle finger without damage to nail, initial encounter: Secondary | ICD-10-CM | POA: Insufficient documentation

## 2023-10-26 DIAGNOSIS — S6992XA Unspecified injury of left wrist, hand and finger(s), initial encounter: Secondary | ICD-10-CM | POA: Diagnosis present

## 2023-10-26 DIAGNOSIS — S61215A Laceration without foreign body of left ring finger without damage to nail, initial encounter: Secondary | ICD-10-CM | POA: Diagnosis not present

## 2023-10-26 DIAGNOSIS — E119 Type 2 diabetes mellitus without complications: Secondary | ICD-10-CM | POA: Insufficient documentation

## 2023-10-26 DIAGNOSIS — Z23 Encounter for immunization: Secondary | ICD-10-CM | POA: Insufficient documentation

## 2023-10-26 DIAGNOSIS — Z79899 Other long term (current) drug therapy: Secondary | ICD-10-CM | POA: Insufficient documentation

## 2023-10-26 DIAGNOSIS — W268XXA Contact with other sharp object(s), not elsewhere classified, initial encounter: Secondary | ICD-10-CM | POA: Insufficient documentation

## 2023-10-26 DIAGNOSIS — Z7984 Long term (current) use of oral hypoglycemic drugs: Secondary | ICD-10-CM | POA: Diagnosis not present

## 2023-10-26 DIAGNOSIS — S61219A Laceration without foreign body of unspecified finger without damage to nail, initial encounter: Secondary | ICD-10-CM

## 2023-10-26 LAB — CBG MONITORING, ED
Glucose-Capillary: 40 mg/dL — CL (ref 70–99)
Glucose-Capillary: 80 mg/dL (ref 70–99)

## 2023-10-26 MED ORDER — LIDOCAINE-EPINEPHRINE (PF) 2 %-1:200000 IJ SOLN
10.0000 mL | Freq: Once | INTRAMUSCULAR | Status: AC
  Administered 2023-10-26: 10 mL
  Filled 2023-10-26: qty 20

## 2023-10-26 MED ORDER — TETANUS-DIPHTH-ACELL PERTUSSIS 5-2.5-18.5 LF-MCG/0.5 IM SUSY
0.5000 mL | PREFILLED_SYRINGE | Freq: Once | INTRAMUSCULAR | Status: AC
  Administered 2023-10-26: 0.5 mL via INTRAMUSCULAR
  Filled 2023-10-26: qty 0.5

## 2023-10-26 NOTE — ED Triage Notes (Signed)
 Pt bib PTAR, endorse lac to LT middle and ring finger today. Bandage applied, pt on thinners and 02 at 2L [place]. Unknown last tetanus

## 2023-10-26 NOTE — ED Provider Notes (Signed)
 [place] EMERGENCY DEPARTMENT AT [facility] Provider Note   CSN: [id number] Arrival date & time: 10/26/23  1844     History  Chief Complaint  Patient presents with   Extremity Laceration    Brenda [name] is a 63 y.o. female past medical history significant for hypertension, CAD, diabetes, obesity who currently takes a blood thinner who presents with concern for laceration of middle and ring finger while cutting pork.  She reports one of the lacerations has stopped bleeding on its own, but 1 has not stopped bleeding for 2 to 3 hours.  She also reports that she took her insulin prior to arrival but could not eat and so was worried that her blood sugar may be going low.  HPI     Home Medications Prior to Admission medications   Medication Sig Start Date End Date Taking? Authorizing Provider  amoxicillin (AMOXIL) 500 MG capsule Take 500 mg by mouth 3 (three) times daily. 07/18/23   [provider]  atorvastatin (LIPITOR) 80 MG tablet Take 1 tablet (80 mg total) by mouth every evening. 10/03/21   Newlin, Odette Horns, MD  FIASP FLEXTOUCH 100 UNIT/ML FlexTouch Pen Inject 10 Units into the skin 2 (two) times daily. 12/08/22   [provider]  FLUoxetine (PROZAC) 40 MG capsule Take 40 mg by mouth daily. 01/17/23   [provider]  furosemide (LASIX) 40 MG tablet Take 1 tablet by mouth once daily 02/05/22   Hoy Register, MD  ibuprofen (ADVIL) 800 MG tablet Take 800 mg by mouth 3 (three) times daily. 07/18/23   [provider]  insulin degludec (TRESIBA) 100 UNIT/ML FlexTouch Pen Inject 20 Units into the skin daily.    [provider]  JARDIANCE 25 MG TABS tablet Take 25 mg by mouth daily. 06/07/22   [provider]  losartan (COZAAR) 25 MG tablet Take 1 tablet (25 mg total) by mouth daily. 07/19/23   Tonny Bollman, MD  metFORMIN (GLUCOPHAGE-XR) 500 MG 24 hr tablet Take 500 mg by mouth 3 (three) times daily. 07/05/22   [provider]  Misc. Devices MISC Portable oxygen concentrator, 2L oxygen.  Diagnoses-chronic respiratory failure with hypoxia 11/27/21   Hoy Register, MD  nitroGLYCERIN (NITROSTAT) 0.4 MG SL tablet Dissolve 1 tablet under the tongue every 5 minutes as needed for chest pain. Max of 3 doses, then 911. 01/25/23   Tereso Newcomer T, PA-C  prasugrel (EFFIENT) 5 MG TABS tablet Take 1 tablet (5 mg total) by mouth daily. 01/25/23 01/25/24  Tereso Newcomer T, PA-C  pregabalin (LYRICA) 100 MG capsule Take 100 mg by mouth 3 (three) times daily. 10/30/22   [provider]      Allergies    Hydrocodone, Clopidogrel, and Clopidogrel bisulfate    Review of Systems   Review of Systems  All other systems reviewed and are negative.   Physical Exam Updated Vital Signs BP 132/63   Pulse 60   Temp 98.8 F (37.1 C)   Resp 16   Wt 95.3 kg   LMP 10/30/2014 (Approximate)   SpO2 100%   BMI 38.41 kg/m  Physical Exam Vitals and nursing note reviewed.  Constitutional:      General: She is not in acute distress.    Appearance: Normal appearance.  HENT:     Head: Normocephalic and atraumatic.  Eyes:     General:        Right eye: No discharge.        Left eye: No  discharge.  Cardiovascular:     Rate and Rhythm: Normal rate and regular rhythm.  Pulmonary:     Effort: Pulmonary effort is normal. No respiratory distress.  Musculoskeletal:        General: No deformity.  Skin:    General: Skin is warm and dry.     Comments: Shallow lacerations of the tip of the middle and ring fingers on the left hand.  The middle finger is still actively bleeding on my initial evaluation, but was able to achieve hemostasis with pressure.  No evidence of foreign body, deep tissue injury, both wounds were extensively cleaned.  Hemostasis achieved and Dermabond applied.  Neurological:     Mental Status: She is alert and oriented to person, place, and time.  Psychiatric:        Mood and Affect: Mood normal.         Behavior: Behavior normal.     ED Results / Procedures / Treatments   Labs (all labs ordered are listed, but only abnormal results are displayed) Labs Reviewed  CBG MONITORING, ED - Abnormal; Notable for the following components:      Result Value   Glucose-Capillary 40 (*)    All other components within normal limits  CBG MONITORING, ED    EKG None  Radiology DG Hand Complete Left Result Date: 10/26/2023 CLINICAL DATA:  Laceration to left middle and ring fingers today. Anterior. Patient was cutting meet with a knife. EXAM: LEFT HAND - COMPLETE 3+ VIEW COMPARISON:  None Available. FINDINGS: There is diffuse decreased bone mineralization. The third and fourth fingers are taped together with overlying bandage material also seen. Mild thumb interphalangeal and second through fifth DIP joint space narrowing and peripheral osteophytosis. No acute fracture or dislocation. No radiopaque foreign body is seen. A laceration is seen at the distal volar aspect of the third finger on lateral view. IMPRESSION: 1. No radiopaque foreign body. 2. 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 Electronically Signed   By: Neita Garnet M.D.   On: 10/26/2023 19:48    Procedures .Laceration Repair  Date/Time: 10/26/2023 11:26 PM  Performed by: Olene Floss, PA-C Authorized by: Olene Floss, PA-C   Consent:    Consent obtained:  Verbal   Consent given by:  Patient   Risks, benefits, and alternatives were discussed: yes     Risks discussed:  Infection, pain, poor cosmetic result, tendon damage, vascular damage, poor wound healing, need for additional repair and nerve damage   Alternatives discussed:  No treatment Universal protocol:    Procedure explained and questions answered to patient or proxy's satisfaction: yes     Patient identity confirmed:  Verbally with patient Anesthesia:    Anesthesia method:  None Laceration details:    Location:  Finger    Finger location:  R long finger   Length (cm):  2.5   Depth (mm):  4 Treatment:    Area cleansed with:  Shur-Clens   Amount of cleaning:  Standard Skin repair:    Repair method:  Tissue adhesive Approximation:    Approximation:  Close Repair type:    Repair type:  Simple Post-procedure details:    Dressing:  Non-adherent dressing   Procedure completion:  Tolerated     Medications Ordered in ED Medications  lidocaine-EPINEPHrine (XYLOCAINE W/EPI) 2 %-1:200000 (PF) injection 10 mL (10 mLs Infiltration Given 10/26/23 2133)  Tdap (BOOSTRIX) injection 0.5 mL (0.5 mLs Intramuscular Given 10/26/23 2133)    ED Course/ Medical  Decision Making/ A&P                                 Medical Decision Making Amount and/or Complexity of Data Reviewed Radiology: ordered.  Risk Prescription drug management.   This is an overall well-appearing 63yo female who presents with a laceration of the fingertips.  The wound was explored through full range of motion, there is no evidence of foreign body, fascia rupture, damage to the deep vessels, capillary refill is intact distal to the site of the laceration.  Patient is not up-to-date on their tetanus, tdap updated in ED.  They are neurovascularly intact throughout on my exam, intact strength of the affected extremity.   During her ED evaluation patient had taken her insulin just prior to arrival, she thought that her blood sugar was low because she did not eat, initial blood sugar was 40, we gave her juice in the emergency department, repeat CBG 80.  Wound was extensively cleaned, and repaired as described above.  After some pressure in the ED both wounds were able to achieve hemostasis without ligature, discussed with patient and she felt reasonable for Dermabond, I agree with full exploration of wounds, no evidence of deeper injury, no evidence of tendon injury, and hemostasis prior to Dermabond.  Patient tolerated the procedure without difficulty.  Instructed to return of infection including worsening pain, redness, pus draining from the affected site.  Instructed to keep the wound clean, bandage, and change the bandage at least once daily.  Patient understands and agrees to the plan, and is discharged in stable condition at this time.  Final Clinical Impression(s) / ED Diagnoses Final diagnoses:  Laceration of finger without complication, initial encounter    Rx / DC Orders ED Discharge Orders     None         Olene Floss, PA-C 10/26/23 2326    Royanne Foots, DO 10/28/23 1713

## 2023-10-26 NOTE — Discharge Instructions (Signed)
 Please monitor for any signs of infection including worsening pain, redness, pus draining from the affected sites.  Please return if you are concerned about infection.  Try to keep the sites clean, you do not need to change the bandage or wash them daily because of the adhesive bandage over top of the wounds.  Just wash your hands like normally.

## 2023-10-26 NOTE — ED Notes (Signed)
 Pt placed on 2lt [place] per home regime

## 2023-10-26 NOTE — ED Notes (Signed)
 Pt provided with 2 8oz orange juices

## 2023-10-28 DIAGNOSIS — E538 Deficiency of other specified B group vitamins: Secondary | ICD-10-CM | POA: Insufficient documentation

## 2023-11-06 ENCOUNTER — Encounter: Payer: Self-pay | Admitting: Skilled Nursing Facility1

## 2023-11-06 ENCOUNTER — Encounter: Payer: Medicaid Other | Attending: Registered Nurse | Admitting: Skilled Nursing Facility1

## 2023-11-06 VITALS — Ht 62.0 in | Wt 219.9 lb

## 2023-11-06 DIAGNOSIS — Z794 Long term (current) use of insulin: Secondary | ICD-10-CM | POA: Insufficient documentation

## 2023-11-06 DIAGNOSIS — E118 Type 2 diabetes mellitus with unspecified complications: Secondary | ICD-10-CM | POA: Insufficient documentation

## 2023-11-06 NOTE — Progress Notes (Signed)
 Other Dx: Anemia Uterin Cancer Hypercholesterolemia  Sleep apnea HTN On Oxygen  MI  DM medications: Jardiance (taken off) Metformin Insulin: Fiasp 5 units before each meal (forgetting sometimes to take it before so takes it after: states this has gotten better) Evaristo B[place] 30 units: CGM GMI 8.2 -Blood sugars going up after 6pm and coming down after 12am; more green in the last 2 weeks with the insulin change   Discussion surrounding emotional eating  Pt states she has been doing good about eating breakfast and lunch now. Pt state she does therapy 2 times a week for balance stating she does workouts on the days off.  Pt states she recognizes she is an emotional eater and struggles with overeating. Pt states she finds she overeats cereal and tries to do cream cheese and a rice cake for cravings. Pt states she does overeat when she is tired.  Pt states she has been identifying when she is about to emotionally eat stating sometimes she was able to pivot: this past Monday chose to read instead of emotionally eat   First meal: banana Snack: cake Second meal: cob salad from [employer] Dinner: ate around 6pm: 2 sliders from [employer] and diet p[employer] having taken 5 units Fiasp pre meal  Diabetes Self-Management Education  Goals: Talk with your doctor about increasing meal time insulin for dinner Try to be more active after dinner to help manage late night rises in blood sugar Continue identifying emotional eating and pivoting; Celebrate! this is a huge win!!  Visit Type: Follow-up  Appt. Start Time: 10:07 Appt. End Time: 10:50  11/06/2023  Brenda [name], identified by name and date of birth, is a 63 y.o. female with a diagnosis of Diabetes: Type 2.   ASSESSMENT  Height 5\' 2"  (1.575 m), weight 219 lb 14.4 oz (99.7 kg), last menstrual period 10/30/2014. Body mass index is 40.22 kg/m.   Diabetes Self-Management Education - 11/06/23 1026       Visit Information   Visit Type Follow-up       Initial Visit   Diabetes Type Type 2    Are you currently following a meal plan? No    Are you taking your medications as prescribed? Yes      Health Coping   How would you rate your overall health? Good      Psychosocial Assessment   Patient Belief/Attitude about Diabetes Motivated to manage diabetes    What is the hardest part about your diabetes right now, causing you the most concern, or is the most worrisome to you about your diabetes?   Making healty food and beverage choices    Self-care barriers None    Self-management support Friends;Family    Patient Concerns Nutrition/Meal planning;Weight Control    Special Needs None    Learning Readiness Contemplating    How often do you need to have someone help you when you read instructions, pamphlets, or other written materials from your doctor or pharmacy? 1 - Never      Pre-Education Assessment   Patient understands the diabetes disease and treatment process. Demonstrates understanding / competency    Patient understands incorporating nutritional management into lifestyle. Demonstrates understanding / competency    Patient undertands incorporating physical activity into lifestyle. Demonstrates understanding / competency    Patient understands using medications safely. Demonstrates understanding / competency    Patient understands monitoring blood glucose, interpreting and using results Demonstrates understanding / competency    Patient understands prevention, detection, and treatment of acute  complications. Demonstrates understanding / competency    Patient understands prevention, detection, and treatment of chronic complications. Demonstrates understanding / competency    Patient understands how to develop strategies to address psychosocial issues. Demonstrates understanding / competency    Patient understands how to develop strategies to promote health/change behavior. Demonstrates understanding / competency      Complications    How often do you check your blood sugar? > 4 times/day    Fasting Blood glucose range (mg/dL) >130    Postprandial Blood glucose range (mg/dL) >865    Number of hyperglycemic episodes ( >200mg /dL): Weekly    Have you had a dilated eye exam in the past 12 months? Yes    Have you had a dental exam in the past 12 months? Yes    Are you checking your feet? Yes    How many days per week are you checking your feet? 7      Activity / Exercise   Activity / Exercise Type ADL's    How many days per week do you exercise? 0    How many minutes per day do you exercise? 0    Total minutes per week of exercise 0      Patient Education   Previous Diabetes Education Yes (please comment)    Healthy Eating Role of diet in the treatment of diabetes and the relationship between the three main macronutrients and blood glucose level;Meal options for control of blood glucose level and chronic complications.;Information on hints to eating out and maintain blood glucose control.;Reviewed blood glucose goals for pre and post meals and how to evaluate the patients' food intake on their blood glucose level.    Being Active Role of exercise on diabetes management, blood pressure control and cardiac health.    Medications Taught/reviewed insulin/injectables, injection, site rotation, insulin/injectables storage and needle disposal.    Monitoring Taught/evaluated CGM (comment);Daily foot exams;Yearly dilated eye exam      Individualized Goals (developed by patient)   Nutrition Follow meal plan discussed;General guidelines for healthy choices and portions discussed    Physical Activity Exercise 3-5 times per week;15 minutes per day    Medications take my medication as prescribed    Monitoring  Consistenly use CGM    Problem Solving Eating Pattern      Patient Self-Evaluation of Goals - Patient rates self as meeting previously set goals (% of time)   Nutrition 25 - 50% (sometimes)    Physical Activity 25 - 50%  (sometimes)    Medications >75% (most of the time)    Monitoring >75% (most of the time)    Problem Solving and behavior change strategies  < 25% (hardly ever/never)    Reducing Risk (treating acute and chronic complications) 25 - 50% (sometimes)    Health Coping 25 - 50% (sometimes)      Post-Education Assessment   Patient understands the diabetes disease and treatment process. Demonstrates understanding / competency    Patient understands incorporating nutritional management into lifestyle. Demonstrates understanding / competency    Patient undertands incorporating physical activity into lifestyle. Demonstrates understanding / competency    Patient understands using medications safely. Demonstrates understanding / competency    Patient understands monitoring blood glucose, interpreting and using results Demonstrates understanding / competency    Patient understands prevention, detection, and treatment of acute complications. Demonstrates understanding / competency    Patient understands prevention, detection, and treatment of chronic complications. Demonstrates understanding / competency    Patient understands how to  develop strategies to address psychosocial issues. Demonstrates understanding / competency    Patient understands how to develop strategies to promote health/change behavior. Demonstrates understanding / competency      Outcomes   Expected Outcomes Demonstrated interest in learning but significant barriers to change    Future DMSE 4-6 wks    Program Status Completed      Subsequent Visit   Since your last visit have you continued or begun to take your medications as prescribed? Yes    Since your last visit have you had your blood pressure checked? No    Since your last visit have you experienced any weight changes? No change    Since your last visit, are you checking your blood glucose at least once a day? Yes             Individualized Plan for Diabetes  Self-Management Training:   Learning Objective:  Patient will have a greater understanding of diabetes self-management. Patient education plan is to attend individual and/or group sessions per assessed needs and concerns.    Expected Outcomes:  Demonstrated interest in learning but significant barriers to change   If problems or questions, patient to contact team via:  Phone and Email  Future DSME appointment: 4-6 wks

## 2023-11-11 ENCOUNTER — Telehealth: Payer: Self-pay

## 2023-11-11 DIAGNOSIS — E10319 Type 1 diabetes mellitus with unspecified diabetic retinopathy without macular edema: Secondary | ICD-10-CM | POA: Insufficient documentation

## 2023-11-11 DIAGNOSIS — E1165 Type 2 diabetes mellitus with hyperglycemia: Secondary | ICD-10-CM | POA: Insufficient documentation

## 2023-11-11 NOTE — Telephone Encounter (Signed)
 I called and spoke with Mitch from [employer]. I was unable to find a DL for this pt and Marthann Schiller stated he would have someone who can get into "[facility]" to email me a dl. NFN

## 2023-11-12 ENCOUNTER — Telehealth (INDEPENDENT_AMBULATORY_CARE_PROVIDER_SITE_OTHER): Payer: Medicaid Other | Admitting: Primary Care

## 2023-11-12 ENCOUNTER — Encounter: Payer: Self-pay | Admitting: Primary Care

## 2023-11-12 ENCOUNTER — Telehealth: Payer: Self-pay

## 2023-11-12 VITALS — BP 180/90 | HR 90 | Temp 97.8°F | Ht 62.0 in | Wt 218.0 lb

## 2023-11-12 DIAGNOSIS — I10 Essential (primary) hypertension: Secondary | ICD-10-CM | POA: Diagnosis not present

## 2023-11-12 DIAGNOSIS — Z9981 Dependence on supplemental oxygen: Secondary | ICD-10-CM

## 2023-11-12 DIAGNOSIS — Z87891 Personal history of nicotine dependence: Secondary | ICD-10-CM

## 2023-11-12 DIAGNOSIS — G4733 Obstructive sleep apnea (adult) (pediatric): Secondary | ICD-10-CM | POA: Diagnosis not present

## 2023-11-12 DIAGNOSIS — J9610 Chronic respiratory failure, unspecified whether with hypoxia or hypercapnia: Secondary | ICD-10-CM | POA: Diagnosis not present

## 2023-11-12 NOTE — Progress Notes (Signed)
 Virtual Visit via Video Note  I connected with Brenda [name] on 11/12/23 at  4:00 PM EDT by a video enabled telemedicine application and verified that I am speaking with the correct person using two identifiers.  Location: Patient: Home  Provider: Office    I discussed the limitations of evaluation and management by telemedicine and the availability of in person appointments. The patient expressed understanding and agreed to proceed.  History of Present Illness: 63 year old, former light smoker quit in 1988. PMH significant for OSA on BIPAP, chronic respiratory failure, heart failure, CAD, HTN, myocardial infarction, type 2 diabetes, hyperlipidemia, endometrial cancer, morbid obesity.   Previous [facility] pulmonary encounter: 01/18/22- Dr. Craige Cotta  She is here with her husband.  She had her sleep study years ago - not available for review.  She got a new PAP device last year.  She was told that her insurance would stop paying for her device, but she wasn't given an explanation as to why.  She uses CPAP nightly.  She has lost 70 lbs over the past year.  She still needs oxygen at night and sleep, but doesn't need when sitting during the day.  10/22/2022 Patient is here today to review sleep study results. Hx sleep apnea, old sleep study not available. She received new CPAP machine last year. She had PSG 09/30/22 showed mild OSA, AHI 12/hr with SpO2 low 79%. Spent 241 mins with O2 < 88%. She sleeps well at night. Typical bedtime 11pm. Falls asleep in 20 mins. She wakes up to use the restroom three times a night d.t lasix. She starts her day at 7:30am. Overall she feels rested when she wakes up in the morning. She does not require naps. Currently on PAP therapy without issues. She wears 2L oxygen 24/7. She recently changed from size medium full face mask to size small which seems to be working better. DME is [facility].    11/12/2023- Interim hx  Patient contacted today for virtual visit for CPAP  compliance   Discussed the use of AI scribe software for clinical note transcription with the patient, who gave verbal consent to proceed.  History of Present Illness   The patient presents for a follow-up of sleep apnea and chronic respiratory failure.  She underwent a repeat sleep study in January of the previous year, which showed mild sleep apnea with 12 apneic events per hour and oxygen saturation dropping to 79%. She spent 241 minutes with an oxygen level less than 88%. She has been using a CPAP machine, specifically a Luna machine, for the past four to five years. There are no issues with the machine itself, although there were previous problems with data transmission, which have since been resolved. Her compliance report from October to January showed 93% compliance with usage greater than four hours per night, averaging 10 to 11 hours of use per night. Her current pressure settings are well controlled, with an average pressure of six and an apnea score of 0.5. If she does not use her CPAP, she wakes up with a headache and feels lethargic. However, when she uses the CPAP, she experiences better sleep quality.  She uses oxygen at two liters per minute continuously and wears it with her CPAP at night. She checks her oxygen levels at home about once a week, typically averaging 97 to 98%. During physical therapy, her oxygen levels have been stable.  She had previously lost 70 pounds, which prompted a repeat sleep study. However, after discontinuing Ozempic  due to stomach issues, she has regained about 18 pounds, maintaining a net loss of approximately 50 pounds from her highest weight. Her current weight is around 220 pounds.  Regarding her blood pressure, it has been running slightly higher since being taken off Entresto, which was causing it to run too low. She occasionally checks her blood pressure at home.      Airview download 06/10/23-09/07/23 Doy Mince machine) Usage days 84/90 days (93%) > 4  hours  Average usage 10 hours 56 mins  Average pressure 6cm h20 (Auto CPAP) Avg airleaks  AHI 0.5    Observations/Objective:  Appears well without overt respiratory symptoms   Assessment and Plan:   OSA on CPAP - Updated sleep study 09/30/22 showed mild OSA, AHI 12/hr with SpO2 low 79%. Patient spent 241 mins with O2 < 88%. She is 93% compliant with auto PAP over the last 90 days and wears oxygen at night. Average pressure 6cm h20 with residual AHI 0.5/hour. Eligible for replacement CPAP machine, current one >5 years. Recommend pressure auto 5-10cm h20.  Chronic Respiratory Failure Chronic respiratory failure managed with continuous oxygen therapy at 2 L/min. Oxygen levels stable at 97-98% at rest and exertion. - Continue oxygen therapy at 2 L/min. - Advise occasional home oxygen level checks, especially during exertion, to ensure levels remain above 90%.  Hypertension Blood pressure management under cardiologist. Recent medication change due to hypotension. Current readings slightly elevated, possibly related to weight fluctuations. - Discuss blood pressure management with cardiologist in May. - Consider medication adjustments in relation to weight changes.  Weight Management Previous 70-pound weight loss with Ozempic, discontinued due to GI issues. Current weight around 220 pounds, maintaining 50-pound loss. - Monitor weight and discuss significant changes with primary care or cardiologist, especially regarding blood pressure management.     Follow Up Instructions:    FU in 8-12 weeks for compliance check  I discussed the assessment and treatment plan with the patient. The patient was provided an opportunity to ask questions and all were answered. The patient agreed with the plan and demonstrated an understanding of the instructions.   The patient was advised to call back or seek an in-person evaluation if the symptoms worsen or if the condition fails to improve as  anticipated.  I provided 25 minutes of non-face-to-face time during this encounter.   Glenford Bayley, NP

## 2023-11-12 NOTE — Telephone Encounter (Signed)
 Please schedule a f/u appointment in 8-12 weeks for CPAP compliance with Buelah Manis, NP. Can be virtual or in person

## 2023-11-28 DIAGNOSIS — Z7185 Encounter for immunization safety counseling: Secondary | ICD-10-CM | POA: Insufficient documentation

## 2023-12-01 ENCOUNTER — Telehealth: Payer: Self-pay | Admitting: Primary Care

## 2023-12-01 NOTE — Telephone Encounter (Signed)
 Patient needs OV in 8-12 weeks for CPAP compliance check

## 2023-12-06 ENCOUNTER — Encounter: Payer: Self-pay | Admitting: Gynecologic Oncology

## 2023-12-06 ENCOUNTER — Inpatient Hospital Stay: Payer: Medicaid Other | Attending: Gynecologic Oncology | Admitting: Gynecologic Oncology

## 2023-12-06 VITALS — BP 139/64 | HR 71 | Temp 98.8°F | Resp 20 | Wt 214.0 lb

## 2023-12-06 DIAGNOSIS — Z923 Personal history of irradiation: Secondary | ICD-10-CM | POA: Diagnosis not present

## 2023-12-06 DIAGNOSIS — Z8542 Personal history of malignant neoplasm of other parts of uterus: Secondary | ICD-10-CM | POA: Diagnosis present

## 2023-12-06 DIAGNOSIS — Z9079 Acquired absence of other genital organ(s): Secondary | ICD-10-CM | POA: Diagnosis not present

## 2023-12-06 DIAGNOSIS — Z90722 Acquired absence of ovaries, bilateral: Secondary | ICD-10-CM | POA: Diagnosis not present

## 2023-12-06 DIAGNOSIS — C541 Malignant neoplasm of endometrium: Secondary | ICD-10-CM

## 2023-12-06 DIAGNOSIS — Z90710 Acquired absence of both cervix and uterus: Secondary | ICD-10-CM | POA: Insufficient documentation

## 2023-12-06 NOTE — Progress Notes (Signed)
 Gynecologic Oncology Return Clinic Visit  12/06/23  Reason for Visit: surveillance in the setting of endometrial cancer   Treatment History: Oncology History Overview Note  MMR IHC: loss of MLH1 and PMS2 MSI - high MLH1 hyper methylation present  Tumor size 6 cm   Endometrial adenocarcinoma (HCC)  10/26/2021 Initial Biopsy   EMB: grade 1 endometrioid adenocarcinoma   11/21/2021 Imaging   CT A/P: 1. No evidence for metastatic disease in the abdomen or pelvis. 2. Endometrium appears thickened, but not well demonstrated by CT. Endometrial stripe appears to be 4.7 cm in thickness on sagittal CT imaging today. 3. Complex paraumbilical hernia contains only fat. 4. Skin thickening with soft tissue stranding noted anterior lower abdominal wall. This could be related to cellulitis and should be amenable to clinical inspection. 5. Advanced coronary artery calcification in the left coronary distribution.   12/06/2021 Initial Diagnosis   Endometrial adenocarcinoma (HCC)   12/06/2021 Surgery   Robotic-assisted laparoscopic total hysterectomy with bilateral salpingoophorectomy, laparoscopic hernia reduction Findings: On EUA, 12 cm mobile uterus.  On intra-abdominal entry, normal appearing upper abdominal survey although right liver edge and diaphragm obscured secondary to adhesions from cholecystectomy.  Normal-appearing omentum with portion of the omentum within a 3 cm umbilical hernia (reduced).  Normal-appearing small and large bowel.  Normal-appearing bilateral fallopian tubes and ovaries.  Uterus 12 cm in quite bulbous.  No obvious adenopathy.  No ascites.  No clear evidence of intra-abdominal or pelvic disease.   12/13/2021 Pathology Results   A. UTERUS, CERVIX, TUBES AND OVARIES:  - Endometrial adenocarcinoma with foci of squamous differentiation, FIGO  grade 1  - Carcinoma invades for a depth of 1.6 cm where myometrial thickness is  2.8 cm ( 60%)  - Carcinoma does not involve the  cervix  - Benign bilateral fallopian tubes and ovaries  - See oncology table   ONCOLOGY TABLE:   UTERUS, CARCINOMA OR CARCINOSARCOMA: Resection   Procedure: Total hysterectomy and bilateral salpingo-oophorectomy  Histologic Type: Endometrioid carcinoma with foci of squamous  differentiation  Histologic Grade: FIGO grade 1  Myometrial Invasion:       Depth of Myometrial Invasion (mm): 16 mm       Myometrial Thickness (mm): 28 mm       Percentage of Myometrial Invasion: 57%  Uterine Serosa Involvement: Not identified  Cervical stromal Involvement: Not identified  Extent of involvement of other tissue/organs: Not identified  Peritoneal/Ascitic Fluid: Not applicable  Lymphovascular Invasion: Not identified  Regional Lymph Nodes: Not applicable (no lymph nodes submitted or found)        Pelvic Lymph Nodes Examined:                                   0 Sentinel                                   0 Non-sentinel                                   0 Total       Pelvic Lymph Nodes with Metastasis: Not applicable       Para-aortic Lymph Nodes Examined:  0 Sentinel                                    0 non-sentinel                                    0 total       Para-aortic Lymph Nodes with Metastasis: Not applicable  Distant Metastasis:       Distant Site(s) Involved: Not applicable  Pathologic Stage Classification (pTNM, AJCC 8th Edition): pT1b, pN not  assigned  Ancillary Studies: MMR / MSI testing will be ordered  Representative Tumor Block: A3    01/22/2022 - 03/07/2022 Radiation Therapy   01/22/2022 through 03/07/2022 Site Technique Total Dose (Gy) Dose per Fx (Gy) Completed Fx Beam Energies  Vagina: Pelvis HDR-brachy 30/30 6 5/5 Ir-192         Interval History: Doing well.  Denies any vaginal bleeding or discharge.  Using her vaginal dilator regularly.  Reports some constipation on Ozempic, this has improved now that she is on Wegovy and  metformin.  Has been diagnosed with bursitis of her left hip, endorses occasional hip pain.  Breathing is at baseline.  Past Medical/Surgical History: Past Medical History:  Diagnosis Date   Anemia    Arthritis    "back, hands, hips" (02/22/2015)   CAD (coronary artery disease)    a. complex LAD/diagonal bifurcation PCI in 2010. b. STEMI 10/2019 s/p PTCA/DES x1 to mLAD overlapping the old stent, residual disease treated medicaly.   Cancer [facility])    uterine   CHF (congestive heart failure) (HCC)    Depression    Diabetes mellitus (HCC)    "started when I was pregnant; not sure if it was type 1 or type 2"    History of radiation therapy    Endometrium- HDR 01/22/22-03/07/22- Dr. Antony Blackbird   Hypercholesterolemia    Hypertension    Morbid obesity (HCC)    Myocardial infarction (HCC)    mild x 3   Neuromuscular disorder (HCC)    neuropathy feet   Sleep apnea    cpap/    Past Surgical History:  Procedure Laterality Date   CARDIAC CATHETERIZATION  12/02/2012   CHOLECYSTECTOMY OPEN  09/04/1987   CORONARY ANGIOPLASTY WITH STENT PLACEMENT  09/03/2009   CORONARY/GRAFT ACUTE MI REVASCULARIZATION N/A 10/26/2019   Procedure: CORONARY/GRAFT ACUTE MI REVASCULARIZATION;  Surgeon: Kathleene Hazel, MD;  Location: [facility] INVASIVE [facility];  Service: Cardiovascular;  Laterality: N/A;   INCISIONAL HERNIA REPAIR N/A 12/09/2021   Procedure: LAPAROSCOPIC REPAIR UMBILICAL HERNIA WITH MESH;  Surgeon: Abigail Miyamoto, MD;  Location: [facility] ORS;  Service: General;  Laterality: N/A;   LEFT HEART CATH AND CORONARY ANGIOGRAPHY N/A 10/26/2019   Procedure: LEFT HEART CATH AND CORONARY ANGIOGRAPHY;  Surgeon: Kathleene Hazel, MD;  Location: [facility] INV[facility] CV [facility];  Service: Cardiovascular;  Laterality: N/A;   LEFT HEART CATHETERIZATION WITH CORONARY ANGIOGRAM N/A 12/25/2012   Procedure: LEFT HEART CATHETERIZATION WITH CORONARY ANGIOGRAM;  Surgeon: Tonny Bollman, MD;  Location: [facility] CATH LAB;  Service:  Cardiovascular;  Laterality: N/A;   ROBOTIC ASSISTED TOTAL HYSTERECTOMY WITH BILATERAL SALPINGO OOPHERECTOMY N/A 12/06/2021   Procedure: XI ROBOTIC ASSISTED TOTAL HYSTERECTOMY WITH BILATERAL SALPINGO OOPHORECTOMY;  Surgeon: Carver Fila, MD;  Location: [facility] ORS;  Service: Gynecology;  Laterality: N/A;   UMBILICAL HERNIA REPAIR  05/04/1989  doesn't think they used mesh    Family History  Problem Relation Age of Onset   Coronary artery disease Mother        in her mid 39s   Hypertension Mother    Cancer Mother        skin   Heart attack Father        in his mid 15s   COPD Father    Stroke Sister    Coronary artery disease Sister    Diabetes Sister    Other Half-Sister    Thyroid cancer Daughter    Prostate cancer Neg Hx    Colon cancer Neg Hx    Breast cancer Neg Hx    Endometrial cancer Neg Hx    Ovarian cancer Neg Hx    Pancreatic cancer Neg Hx     Social History   Socioeconomic History   Marital status: Married    Spouse name: Not on file   Number of children: 2   Years of education: Not on file   Highest education level: Not on file  Occupational History   Occupation: Housewife  Tobacco Use   Smoking status: Former    Current packs/day: 0.00    Average packs/day: 0.5 packs/day for 1 year (0.5 ttl pk-yrs)    Types: Cigarettes, Cigars    Start date: 09/03/1985    Quit date: 09/03/1986    Years since quitting: 37.2   Smokeless tobacco: Never  Vaping Use   Vaping status: Never Used  Substance and Sexual Activity   Alcohol use: No   Drug use: No   Sexual activity: Not Currently  Other Topics Concern   Not on file  Social History Narrative   Not on file   Social Drivers of Health   Financial Resource Strain: High Risk (02/10/2021)   Overall Financial Resource Strain (CARDIA)    Difficulty of Paying Living Expenses: Hard  Food Insecurity: Food Insecurity Present (02/10/2021)   Hunger Vital Sign    Worried About Running Out of Food in the Last Year:  Sometimes true    Ran Out of Food in the Last Year: Sometimes true  Transportation Needs: No Transportation Needs (02/10/2021)   PRAPARE - Administrator, Civil Service (Medical): No    Lack of Transportation (Non-Medical): No  Physical Activity: Not on file  Stress: Not on file  Social Connections: Not on file    Current Medications:  Current Outpatient Medications:    atorvastatin (LIPITOR) 80 MG tablet, Take 1 tablet (80 mg total) by mouth every evening., Disp: 30 tablet, Rfl: 6   buPROPion (WELLBUTRIN XL) 150 MG 24 hr tablet, Take 150 mg by mouth daily., Disp: , Rfl:    Cholecalciferol 50 MCG (2000 UT) CAPS, 1 capsule Orally Once a day, Disp: , Rfl:    FIASP FLEXTOUCH 100 UNIT/ML FlexTouch Pen, Inject 10 Units into the skin 2 (two) times daily., Disp: , Rfl:    FLUoxetine (PROZAC) 40 MG capsule, Take 40 mg by mouth daily., Disp: , Rfl:    furosemide (LASIX) 40 MG tablet, Take 1 tablet by mouth once daily, Disp: 30 tablet, Rfl: 0   insulin degludec (TRESIBA) 100 UNIT/ML FlexTouch Pen, Inject 20 Units into the skin daily., Disp: , Rfl:    losartan (COZAAR) 25 MG tablet, Take 1 tablet (25 mg total) by mouth daily., Disp: 90 tablet, Rfl: 3   metFORMIN (GLUCOPHAGE-XR) 500 MG 24 hr tablet, Take 500 mg by mouth 3 (three) times daily.,  Disp: , Rfl:    methocarbamol (ROBAXIN) 500 MG tablet, Take 500-1,000 mg by mouth 3 (three) times daily as needed., Disp: , Rfl:    Misc. Devices MISC, Portable oxygen concentrator, 2L oxygen.  Diagnoses-chronic respiratory failure with hypoxia, Disp: 1 each, Rfl: 0   nitroGLYCERIN (NITROSTAT) 0.4 MG SL tablet, Dissolve 1 tablet under the tongue every 5 minutes as needed for chest pain. Max of 3 doses, then 911., Disp: 25 tablet, Rfl: 6   polyethylene glycol powder (GLYCOLAX/MIRALAX) 17 GM/SCOOP powder, Take 17 g by mouth daily., Disp: , Rfl:    prasugrel (EFFIENT) 5 MG TABS tablet, Take 1 tablet (5 mg total) by mouth daily., Disp: 90 tablet, Rfl: 3    pregabalin (LYRICA) 100 MG capsule, Take 100 mg by mouth 3 (three) times daily., Disp: , Rfl:    WEGOVY 0.25 MG/0.5ML SOAJ, SMARTSIG:0.25 Milligram(s) SUB-Q Once a Week, Disp: , Rfl:   Review of Systems: Denies appetite changes, fevers, chills, fatigue, unexplained weight changes. Denies hearing loss, neck lumps or masses, mouth sores, ringing in ears or voice changes. Denies cough or wheezing.  Denies shortness of breath. Denies chest pain or palpitations. Denies leg swelling. Denies abdominal distention, pain, blood in stools, constipation, diarrhea, nausea, vomiting, or early satiety. Denies pain with intercourse, dysuria, frequency, hematuria or incontinence. Denies hot flashes, pelvic pain, vaginal bleeding or vaginal discharge.   Denies joint pain, back pain or muscle pain/cramps. Denies itching, rash, or wounds. Denies dizziness, headaches, numbness or seizures. Denies swollen lymph nodes or glands, denies easy bruising or bleeding. Denies anxiety, depression, confusion, or decreased concentration.  Physical Exam: BP 139/64 (BP Location: Right Arm, Patient Position: Sitting)   Pulse 71   Temp 98.8 F (37.1 C) (Oral)   Resp 20   Wt 214 lb (97.1 kg)   LMP 10/30/2014 (Approximate)   SpO2 99%   BMI 39.14 kg/m  General: Alert, oriented, no acute distress. HEENT: Normocephalic, atraumatic, sclera anicteric. Chest: Distant breath sounds but clear to auscultation bilaterally.  Unlabored breathing on 2L oxygen by nasal cannula. Cardiovascular: Regular rate and rhythm, no murmurs. Abdomen: Obese, soft, nontender.  Normoactive bowel sounds.  No masses or hepatosplenomegaly appreciated.  Well-healed incisions.  Extremities: Grossly normal range of motion.  Warm, well perfused.  Trace edema bilaterally. Skin: No rashes or lesions noted. Lymphatics: No cervical, supraclavicular, or inguinal adenopathy. GU: Normal appearing external genitalia without erythema, excoriation, or lesions.   Speculum exam reveals Intact with minimal scarring at the apex, mild radiation changes.  Bimanual exam reveals cuff intact, no nodularity or masses.  Rectovaginal exam confirms findings.  Laboratory & Radiologic Studies: None new  Assessment & Plan: Brenda [name] is a 63 y.o. 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 No LVI. MSI-high. MMR IHC with loss of MLH1 and PMS2.  MLH1 promoter hypermethylation present. Discussed options for adjuvant treatment.  Ultimately elected for treated with adjuvant VBT, completed in 03/2022. Presents for surveillance.   Continues to do well.  She is NED on exam today.   Per NCCN surveillance recommendations, discussed plan for surveillance visits every 3 months alternating between my office and radiation oncology.  She will see Dr. Roselind Messier in July and sees me again in 6 months.  We discussed signs and symptoms that would be concerning for cancer recurrence, and I stressed the importance of calling if she develops any of these.   Discussed the importance of continued vaginal dilator use.  20 minutes of total time was spent for this patient encounter, including preparation, face-to-face counseling with the patient and coordination of care, and documentation of the encounter.  Eugene Garnet, MD  Division of Gynecologic Oncology  Department of Obstetrics and Gynecology  [facility]

## 2023-12-06 NOTE — Patient Instructions (Signed)
 It was good to see you today.  I do not see or feel any evidence of cancer recurrence on your exam.  I will see you for follow-up in 6 months.  As always, if you develop any new and concerning symptoms before your next visit, please call to see me sooner.

## 2023-12-24 DIAGNOSIS — F331 Major depressive disorder, recurrent, moderate: Secondary | ICD-10-CM | POA: Insufficient documentation

## 2024-01-06 ENCOUNTER — Ambulatory Visit: Admitting: Skilled Nursing Facility1

## 2024-01-08 ENCOUNTER — Ambulatory Visit: Admitting: Primary Care

## 2024-01-30 ENCOUNTER — Telehealth: Payer: Self-pay | Admitting: Cardiovascular Disease

## 2024-01-30 ENCOUNTER — Other Ambulatory Visit: Payer: Self-pay | Admitting: Physician Assistant

## 2024-01-30 DIAGNOSIS — M25552 Pain in left hip: Secondary | ICD-10-CM

## 2024-01-30 DIAGNOSIS — L255 Unspecified contact dermatitis due to plants, except food: Secondary | ICD-10-CM | POA: Insufficient documentation

## 2024-01-30 NOTE — Telephone Encounter (Signed)
 I s/w the pt about preop appt. Pt opts to be seen sooner as requesting office is going to move her procedure out a bit as request was not sent in time to cardiology. Per preop APP Marlana Silvan, NP the pt can be seen sooner if she would like and we can cancel the 02/12/24 appt with Charles Connor, NP. Pt is grateful for the sooner appt. Pt also states that her PCP is managing Effient , see previous notes from today about Effient .   I will update all parties involved.

## 2024-01-30 NOTE — Telephone Encounter (Signed)
   Pre-operative Risk Assessment    Patient Name: Brenda [name]  DOB: 31-Oct-1960 MRN: [id number]   Date of last office visit: 07/19/23 with Dr. Arlester Ladd Date of next office visit: 02/12/24 with Charles Connor   Request for Surgical Clearance    Procedure:  Colonoscopy  Date of Surgery:  Clearance 02/04/24                                 Surgeon:  Dr. Genell Ken Surgeon's Group or Practice Name:  [facility] Phone number:  [phone] Fax number:  [phone]   Type of Clearance Requested:   - Medical  - Pharmacy:  Hold Prasugrel  (Effient ) see phone note from today with GI office to confirm if patient is taking Effient .   Type of Anesthesia:  Propofol    Additional requests/questions:    [name]   01/30/2024, 11:50 AM

## 2024-01-30 NOTE — Telephone Encounter (Signed)
 Patient at low risk of holding Effient  for colonoscopy.  Okay to hold x 7 days and should resume when safe after the procedure.  To resume Effient  will be provided by the GI physician.  Patient's chart reviewed and her last PCI procedure was 4 years ago.

## 2024-01-30 NOTE — Telephone Encounter (Signed)
   Name: Moncia Annas  DOB: 10-Jun-1961  MRN: [id number]  Primary Cardiologist: Arnoldo Lapping, MD  Chart reviewed as part of pre-operative protocol coverage. Because of Saphira Lahmann Bently's past medical history and time since last visit, she will require a follow-up in-office visit in order to better assess preoperative cardiovascular risk.  Patient is overdue for 46-month follow-up.  Pre-op covering staff: - Please schedule appointment and call patient to inform them. If patient already had an upcoming appointment within acceptable timeframe, please add "pre-op clearance" to the appointment notes so provider is aware. - Please contact requesting surgeon's office via preferred method (i.e, phone, fax) to inform them of need for appointment prior to surgery.  This message will also be routed to Dr Arlester Ladd for input on holding Effient  as requested below so that this information is available to the clearing provider at time of patient's appointment.   Jude Norton, NP  01/30/2024, 12:18 PM

## 2024-01-30 NOTE — Telephone Encounter (Signed)
 Spoke with GI office to confirm Effient  to be held per clearance request, Cardiology does not have record of Effient  in med list. Gi office will call patient to confirm if taken Effient  and will call us  back.

## 2024-01-30 NOTE — Telephone Encounter (Signed)
 Nurse from [facility] stating she was told to call back and speak to the Pre-op department regarding this pt. Please advise

## 2024-01-31 ENCOUNTER — Encounter: Payer: Self-pay | Admitting: Emergency Medicine

## 2024-01-31 ENCOUNTER — Ambulatory Visit: Attending: Emergency Medicine | Admitting: Emergency Medicine

## 2024-01-31 VITALS — BP 114/62 | HR 70 | Ht 62.0 in | Wt 216.0 lb

## 2024-01-31 DIAGNOSIS — G4733 Obstructive sleep apnea (adult) (pediatric): Secondary | ICD-10-CM

## 2024-01-31 DIAGNOSIS — I10 Essential (primary) hypertension: Secondary | ICD-10-CM | POA: Diagnosis present

## 2024-01-31 DIAGNOSIS — I2510 Atherosclerotic heart disease of native coronary artery without angina pectoris: Secondary | ICD-10-CM

## 2024-01-31 DIAGNOSIS — E785 Hyperlipidemia, unspecified: Secondary | ICD-10-CM

## 2024-01-31 DIAGNOSIS — J9610 Chronic respiratory failure, unspecified whether with hypoxia or hypercapnia: Secondary | ICD-10-CM

## 2024-01-31 DIAGNOSIS — Z01810 Encounter for preprocedural cardiovascular examination: Secondary | ICD-10-CM | POA: Diagnosis present

## 2024-01-31 DIAGNOSIS — I5032 Chronic diastolic (congestive) heart failure: Secondary | ICD-10-CM

## 2024-01-31 NOTE — Progress Notes (Signed)
 Cardiology Office Note:    Date:  01/31/2024  ID:  Brenda, [name] 02/06/1961, MRN [id number] PCP: Brenda Igo, NP   [facility]Care Providers Cardiologist:  Brenda Lapping, MD       Patient Profile:       Chief Complaint: 43-month follow-up and preoperative cardiovascular exam History of Present Illness:  Brenda [name] is a 63 y.o. female with visit-pertinent history of coronary artery disease, HFpEF, RV failure, chronic respiratory failure, type 2 diabetes, OSA, hypertension, hyperlipidemia, morbid obesity, endometrial cancer s/p hysterectomy with BSO, history of SBO  Patient with history of NSTEMI in 2000 s/p treatment with complex bifurcational LAD/Dx stenting.  She had anterior STEMI 10/2019 s/p DES to mid LAD (overlapping with old stent) and chronic ost DES stenosis (jailed by stent).  Echocardiogram 2/21 LVEF 60 to 65%, no RWMA, mild LVH, grade 1 DD, normal RVSF, mild LAE.  Echocardiogram 6/22 with LVEF 60 to 65%, no RWMA, moderately reduced RVSF.  She had Myoview  11/2021 that was low risk with EF 71%.  Patient was seen in office on 07/19/2023 by Dr. Arlester [name].  The patient was clinically stable with no signs and symptoms of volume overload on exam.  With normal LVEF and low blood pressure of 92/58, Entresto  was stopped and losartan  was started at low-dose.  Was suspected her weight loss has reduced her antihypertensive requirements.  Echocardiogram was repeated on 08/2023 with LVEF 60 to 65%, no RWMA, mild LVH, grade 2 DD, elevated LVEDP, RV function mildly reduced, RV size normal, normal PASP, trivial MR, moderate MAC.   Discussed the use of AI scribe software for clinical note transcription with the patient, who gave verbal consent to proceed.  History of Present Illness Brenda [name] is a 63 year old female with coronary artery disease and heart failure who presents for a routine follow-up visit.  Today patient notes she is doing well overall.  She tells me  for over the past 2 years she feels like her health just keeps getting better and better. She experiences no chest pain and has not used nitroglycerin  for two to three years. She remains physically active, able to walk up a flight of stairs, and engages in yard work.  She notes her blood pressure has shown improvement and now stabilized.  She has been on two liters of oxygen  for almost three years at all times.  She denies any worsening of her chronic SOB.  She denies any exertional symptoms, no chest pain, orthopnea, PND, palpitations, syncope, presyncope, lightheadedness, dizziness.  Review of systems:  Please see the history of present illness. All other systems are reviewed and otherwise negative.      Studies Reviewed:    EKG Interpretation Date/Time:  Friday Jan 31 2024 14:24:32 EDT Ventricular Rate:  68 PR Interval:  192 QRS Duration:  124 QT Interval:  448 QTC Calculation: 476 R Axis:   -73  Text Interpretation: Normal sinus rhythm with sinus arrhythmia Left axis deviation Right bundle branch block Inferior infarct (cited on or before 01-Nov-2021) Anterolateral infarct (cited on or before 01-Nov-2021) Confirmed by Brenda [name] [phone]) on 01/31/2024 2:27:24 PM    Echocardiogram 08/23/2023  1. Left ventricular ejection fraction, by estimation, is 60 to 65%. Left  ventricular ejection fraction by 2D MOD biplane is 61.8 %. The left  ventricle has normal function. The left ventricle has no regional wall  motion abnormalities. There is mild left  ventricular hypertrophy. Left ventricular diastolic parameters are  consistent with Grade  II diastolic dysfunction (pseudonormalization).  Elevated left ventricular end-diastolic pressure. The E/e' is 24.   2. Right ventricular systolic function is mildly reduced. The right  ventricular size is normal. There is normal pulmonary artery systolic  pressure. The estimated right ventricular systolic pressure is 25.7 mmHg.   3. The mitral  valve is abnormal. Trivial mitral valve regurgitation.  Moderate mitral annular calcification.   4. The aortic valve is tricuspid. Aortic valve regurgitation is not  visualized. Aortic valve area, by VTI measures 1.98 cm. Aortic valve mean  gradient measures 9.6 mmHg. Aortic valve Vmax measures 2.04 m/s.   5. The inferior vena cava is normal in size with greater than 50%  respiratory variability, suggesting right atrial pressure of 3 mmHg.   Risk Assessment/Calculations:              Physical Exam:   VS:  BP 114/62 (BP Location: Left Arm, Patient Position: Sitting, Cuff Size: Large)   Pulse 70   Ht 5\' 2"  (1.575 m)   Wt 216 lb (98 kg)   LMP 10/30/2014 (Approximate)   BMI 39.51 kg/m    Wt Readings from Last 3 Encounters:  01/31/24 216 lb (98 kg)  12/06/23 214 lb (97.1 kg)  11/12/23 218 lb (98.9 kg)    GEN: Well nourished, well developed in no acute distress NECK: No JVD; No carotid bruits CARDIAC: RRR, no murmurs, rubs, gallops RESPIRATORY:  Clear to auscultation without rales, wheezing or rhonchi  ABDOMEN: Soft, non-tender, non-distended EXTREMITIES:  No edema; No acute deformity      Assessment and Plan:  Preoperative cardiovascular exam According to the Revised Cardiac Risk Index (RCRI), her Perioperative Risk of Major Cardiac Event is (%): 11. Her Functional Capacity in METs is: 5.62 according to the Duke Activity Status Index (DASI). Therefore, based on ACC/AHA guidelines, patient would be at acceptable risk for the planned procedure without further cardiovascular testing. I will route this recommendation to the requesting party via Epic fax function.  Per office protocol, if patient is without any new symptoms or concerns at the time of their virtual visit, she may hold Effient  for 7 days prior to procedure. Please resume Effient  as soon as possible postprocedure, at the discretion of the surgeon.    HFpEF Echocardiogram 08/2023 with LVEF 60 to 65%, no RWMA, mild LVH,  grade 2 DD, RV mildly reduced (previously moderate), normal PASP - Today patient is euvolemic and well compensated on exam.  NYHA class II - She remains active without exertional symptoms.  Weight remains stable - Continue GDMT Lasix  40 mg daily and losartan  25 mg daily  Coronary artery disease S/p NSTEMI in 2000 tx w/ complex bifurcational LAD/Dx stenting  LHC 10/2019 s/p DES to mid LAD (overlapping with old stent); chronic ost Dx stenosis (jailed by stent)  - EKG today without acute ischemic changes - Today patient is without any anginal symptoms, no indication for further ischemic evaluation at this time - Continue atorvastatin  80 mg daily and Effient  5 mg daily  Hyperlipidemia LDL 40, HDL 45, TG 130 on 09/6107 LDL under excellent control and under goal of less than 70 - Continue atorvastatin  80 mg daily  Hypertension Blood pressure is 114/62 and under excellent control - Maintain home BP log and monitoring - Continue losartan  25 mg daily  Chronic respiratory failure Managed with continuous oxygen  therapy at 2 L/min - Today oxygen  levels are stable - No recent exacerbation - Managed by pulmonology  Obstructive sleep apnea Remains adherent to  CPAP therapy      Dispo:  Return in about 6 months (around 08/02/2024).  Signed, Ava Boatman, NP

## 2024-01-31 NOTE — Patient Instructions (Signed)
 Medication Instructions:  NO CHANGES   Lab Work: NONE   Testing/Procedures: NONE  Follow-Up: At [facility], you and your health needs are our priority.  As part of our continuing mission to provide you with exceptional heart care, our providers are all part of one team.  This team includes your primary Cardiologist (physician) and Advanced Practice Providers or APPs (Physician Assistants and Nurse Practitioners) who all work together to provide you with the care you need, when you need it.  Your next appointment:   6 month(s)  Provider:   Arnoldo Lapping, MD OR Palmer Bobo, DNP

## 2024-02-01 ENCOUNTER — Encounter: Payer: Self-pay | Admitting: Emergency Medicine

## 2024-02-03 ENCOUNTER — Encounter: Attending: Nurse Practitioner | Admitting: Skilled Nursing Facility1

## 2024-02-03 ENCOUNTER — Ambulatory Visit: Admitting: Primary Care

## 2024-02-03 ENCOUNTER — Encounter: Payer: Self-pay | Admitting: Skilled Nursing Facility1

## 2024-02-03 ENCOUNTER — Encounter: Payer: Self-pay | Admitting: Primary Care

## 2024-02-03 VITALS — BP 142/71 | HR 61 | Temp 97.4°F | Ht 62.0 in | Wt 213.2 lb

## 2024-02-03 DIAGNOSIS — E118 Type 2 diabetes mellitus with unspecified complications: Secondary | ICD-10-CM | POA: Diagnosis present

## 2024-02-03 DIAGNOSIS — Z9981 Dependence on supplemental oxygen: Secondary | ICD-10-CM

## 2024-02-03 DIAGNOSIS — Z87891 Personal history of nicotine dependence: Secondary | ICD-10-CM

## 2024-02-03 DIAGNOSIS — J9611 Chronic respiratory failure with hypoxia: Secondary | ICD-10-CM

## 2024-02-03 DIAGNOSIS — Z794 Long term (current) use of insulin: Secondary | ICD-10-CM | POA: Insufficient documentation

## 2024-02-03 DIAGNOSIS — G4733 Obstructive sleep apnea (adult) (pediatric): Secondary | ICD-10-CM

## 2024-02-03 DIAGNOSIS — D649 Anemia, unspecified: Secondary | ICD-10-CM

## 2024-02-03 NOTE — Progress Notes (Signed)
 @Patient  ID: Brenda [name], female    DOB: 13-Feb-1961, 63 y.o.   MRN: [id number]  Chief Complaint  Patient presents with   Follow-up    OSA and O2 f/u. Pt uses 2L every day and HS    Referring provider: Tann, Samandra, NP  HPI: 63 year old, former light smoker quit in 1988. PMH significant for OSA on BIPAP, chronic respiratory failure, heart failure, CAD, HTN, myocardial infarction, type 2 diabetes, hyperlipidemia, endometrial cancer, morbid obesity.   Previous [facility] pulmonary encounter: 01/18/22- Dr. Matilde Son  She is here with her husband.  She had her sleep study years ago - not available for review.  She got a new PAP device last year.  She was told that her insurance would stop paying for her device, but she wasn't given an explanation as to why.  She uses CPAP nightly.  She has lost 70 lbs over the past year.  She still needs oxygen  at night and sleep, but doesn't need when sitting during the day.   10/22/2022 Patient is here today to review sleep study results. Hx sleep apnea, old sleep study not available. She received new CPAP machine last year. She had PSG 09/30/22 showed mild OSA, AHI 12/hr with SpO2 low 79%. Spent 241 mins with O2 < 88%. She sleeps well at night. Typical bedtime 11pm. Falls asleep in 20 mins. She wakes up to use the restroom three times a night d.t lasix . She starts her day at 7:30am. Overall she feels rested when she wakes up in the morning. She does not require naps. Currently on PAP therapy without issues. She wears 2L oxygen  24/7. She recently changed from size medium full face mask to size small which seems to be working better. DME is [facility].    02/03/2024- Interim hx  Discussed the use of AI scribe software for clinical note transcription with the patient, who gave verbal consent to proceed.  History of Present Illness   Brenda [name] is a 63 year old female with sleep apnea and respiratory failure who presents for follow-up.  She has mild sleep  apnea, diagnosed with a sleep study in January 2024 showing twelve apneic events per hour. She is compliant with CPAP therapy, initially using a Luna CPAP machine and recently transitioning to a ResMed CPAP machine. The CPAP settings are on auto pressure between five to ten, and she uses the machine for twelve hours on average per day. Despite experiencing some air leaks, her apnea-hypopnea index (AHI) is 0.8, indicating less than one apneic event per hour. She has difficulty falling asleep, often wearing the CPAP while trying to sleep, which may contribute to the air leaks.  She is on continuous oxygen  therapy at two liters per minute for chronic respiratory failure, with stable oxygen  levels at home. She ensures her oxygen  saturation remains above ninety percent. She occasionally goes without oxygen  during the day for short periods but experiences confusion if off oxygen  for more than twenty to thirty minutes.  She is dealing with bursitis, which causes significant pain at night and contributes to her sleep difficulties. She is scheduled for an MRI on Thursday to further evaluate this condition.  She has recently started Wegovy  for weight loss, beginning two months ago. She was on a dose of 0.25 mg and has not yet started the increased dosage.  She has a history of pernicious anemia which is being managed by her general practitioner.     Airview download 12/31/23-01/29/24 Usage 30/30days Average  usage 12 hours Pressure 5-10cm h20 Airleaks 13.9L/min (median); 68L/min (95%) AHI 0.8  Allergies  Allergen Reactions   Hydrocodone Shortness Of Breath    "I forget to breathe."    Clopidogrel Rash   Clopidogrel Bisulfate Rash    Immunization History  Administered Date(s) Administered   Influenza, Seasonal, Injecte, Preservative Fre 05/06/2023   Influenza,inj,Quad PF,6+ Mos 10/27/2019, 06/26/2021   Pneumococcal Polysaccharide-23 10/27/2019   Pneumococcal-Unspecified 05/04/2009   Tdap 10/26/2023     Past Medical History:  Diagnosis Date   Anemia    Arthritis    "back, hands, hips" (02/22/2015)   CAD (coronary artery disease)    a. complex LAD/diagonal bifurcation PCI in 2010. b. STEMI 10/2019 s/p PTCA/DES x1 to mLAD overlapping the old stent, residual disease treated medicaly.   Cancer [facility])    uterine   CHF (congestive heart failure) (HCC)    Depression    Diabetes mellitus (HCC)    "started when I was pregnant; not sure if it was type 1 or type 2"    History of radiation therapy    Endometrium- HDR 01/22/22-03/07/22- Dr. Retta Caster   Hypercholesterolemia    Hypertension    Morbid obesity (HCC)    Myocardial infarction (HCC)    mild x 3   Neuromuscular disorder (HCC)    neuropathy feet   Sleep apnea    cpap/    Tobacco History: Social History   Tobacco Use  Smoking Status Former   Current packs/day: 0.00   Average packs/day: 0.5 packs/day for 1 year (0.5 ttl pk-yrs)   Types: Cigarettes, Cigars   Start date: 09/03/1985   Quit date: 09/03/1986   Years since quitting: 37.4  Smokeless Tobacco Never   Counseling given: Not Answered   Outpatient Medications Prior to Visit  Medication Sig Dispense Refill   atorvastatin  (LIPITOR ) 80 MG tablet Take 1 tablet (80 mg total) by mouth every evening. 30 tablet 6   buPROPion (WELLBUTRIN XL) 150 MG 24 hr tablet Take 150 mg by mouth daily.     Cholecalciferol 50 MCG (2000 UT) CAPS 1 capsule Orally Once a day     FIASP  FLEXTOUCH 100 UNIT/ML FlexTouch Pen Inject 10 Units into the skin 2 (two) times daily.     FLUoxetine  (PROZAC ) 40 MG capsule Take 40 mg by mouth daily.     furosemide  (LASIX ) 40 MG tablet Take 1 tablet by mouth once daily 30 tablet 0   insulin  degludec (TRESIBA) 100 UNIT/ML FlexTouch Pen Inject 20 Units into the skin daily.     losartan  (COZAAR ) 25 MG tablet Take 1 tablet (25 mg total) by mouth daily. 90 tablet 3   metFORMIN (GLUCOPHAGE-XR) 500 MG 24 hr tablet Take 500 mg by mouth 3 (three) times daily.      methocarbamol (ROBAXIN) 500 MG tablet Take 500-1,000 mg by mouth 3 (three) times daily as needed.     Misc. Devices MISC Portable oxygen  concentrator, 2L oxygen .  Diagnoses-chronic respiratory failure with hypoxia 1 each 0   nitroGLYCERIN  (NITROSTAT ) 0.4 MG SL tablet Dissolve 1 tablet under the tongue every 5 minutes as needed for chest pain. Max of 3 doses, then 911. 25 tablet 6   polyethylene glycol powder (GLYCOLAX /MIRALAX ) 17 GM/SCOOP powder Take 17 g by mouth daily.     prasugrel  (EFFIENT ) 5 MG TABS tablet Take 5 mg by mouth daily.     pregabalin (LYRICA) 100 MG capsule Take 100 mg by mouth 3 (three) times daily.     WEGOVY  0.25  MG/0.5ML SOAJ SMARTSIG:0.25 Milligram(s) SUB-Q Once a Week     No facility-administered medications prior to visit.      Review of Systems  Review of Systems   Physical Exam  BP (!) 142/71 (BP Location: Left Arm, Patient Position: Sitting, Cuff Size: Large)   Pulse 61   Temp (!) 97.4 F (36.3 C) (Temporal)   Ht 5\' 2"  (1.575 m)   Wt 213 lb 3.2 oz (96.7 kg)   LMP 10/30/2014 (Approximate)   SpO2 98%   BMI 38.99 kg/m  Physical Exam   Lab Results:  CBC    Component Value Date/Time   WBC 9.4 04/19/2023 1558   RBC 5.14 (H) 04/19/2023 1558   HGB 14.8 04/19/2023 1558   HGB 13.2 01/25/2023 0945   HCT 45.2 04/19/2023 1558   HCT 39.2 01/25/2023 0945   PLT 307 04/19/2023 1558   PLT 293 01/25/2023 0945   MCV 87.9 04/19/2023 1558   MCV 86 01/25/2023 0945   MCH 28.8 04/19/2023 1558   MCHC 32.7 04/19/2023 1558   RDW 14.0 04/19/2023 1558   RDW 13.6 01/25/2023 0945   LYMPHSABS 1.5 12/08/2021 1836   LYMPHSABS 2.3 11/01/2021 1624   MONOABS 0.9 12/08/2021 1836   EOSABS 0.2 12/08/2021 1836   EOSABS 0.2 11/01/2021 1624   BASOSABS 0.0 12/08/2021 1836   BASOSABS 0.1 11/01/2021 1624    BMET    Component Value Date/Time   NA 134 (L) 04/19/2023 1558   NA 138 01/25/2023 0945   K 3.0 (L) 04/19/2023 1558   CL 93 (L) 04/19/2023 1558   CO2 24 04/19/2023  1558   GLUCOSE 141 (H) 04/19/2023 1558   BUN 12 04/19/2023 1558   BUN 13 01/25/2023 0945   CREATININE 0.91 04/19/2023 1558   CREATININE 0.83 03/29/2016 1030   CALCIUM  9.5 04/19/2023 1558   GFRNONAA >60 04/19/2023 1558   GFRNONAA 81 03/29/2016 1030   GFRAA 94 12/16/2019 1041   GFRAA >89 03/29/2016 1030    BNP    Component Value Date/Time   BNP 24.2 04/19/2023 1713    ProBNP    Component Value Date/Time   PROBNP 103 10/04/2021 0839   PROBNP 68.0 01/17/2011 1847    Imaging: No results found.   Assessment & Plan:   1. OSA on CPAP (Primary)  2. Chronic respiratory failure with hypoxia (HCC)   Assessment and Plan    Respiratory failure Chronic respiratory failure due to obesity and anemia managed with continuous oxygen  therapy at 2 L/min. Oxygen  saturation remains above 90% at home. Mild cognitive symptoms occur when off oxygen  for more than 20-30 minutes, indicating the need for continuous use.  - Continue continuous oxygen  therapy at 2 L/min.  Obstructive sleep apnea Mild obstructive sleep apnea diagnosed in January 2024 with 12 apneic events per hour. Received replacement CPAP machine. Well-controlled with CPAP therapy auto settings 5-10cm h20, residual AHI of 0.8/hour. She is 100% compliant with CPAP use, averaging 12 hours per day, though experiencing some air leaks with are not bothersome. Reports difficulty falling asleep, but does not wish to pursue medication for sleep at this time. - Continue CPAP therapy nightly 4-6 hours or longer - Monitor for air leaks and address if they become problematic. - DME [employer]    Bursitis Diagnosed with bursitis, experiencing significant nocturnal pain affecting sleep. Scheduled for an MRI on Thursday to further evaluate the condition. - Proceed with MRI to evaluate bursitis.  Pernicious anemia Pernicious anemia with microcytic red blood cells affecting oxygen   transport. Managed by primary care provider.  - Continue management  with primary care provider.   Antonio Baumgarten, NP 02/03/2024

## 2024-02-03 NOTE — Progress Notes (Signed)
 Other Dx: Anemia Uterin Cancer Hypercholesterolemia  Sleep apnea HTN On Oxygen   MI  DM medications: Jardiance  (taken off) Metformin Insulin : Fiasp  5 units before breakfast and lunch and 10 units before dinner and Tresiba 20 units every evening  Pt states she is still having high blood sugars after dinner but after some discussion it is only happening once per week (data not available on CGM).  Discussion surrounding emotional eating  Pt states she had a severe case of poison ivy needing to take steroids which caused high blood sugars for her.  Pt states she does have retinopathy causing floaters and this as really been stressing her out.  Pt states she has been drinking more water.   Pt states it is just her and her nephew but they have been trying to eat more from home and cook more. Pt states she has been cutting veggies and using seasonings and is pretty excited for this cooking adventure.  Pt states she hah been feeling better than she has in years and sees the progress and is starting to love her body. Pt states she wants to be outside and be active but is struggling with painful bursitis. Pt states there is a senior center gym near her house she is interested in checking out.    Diabetes Self-Management Education  Diabetes Self-Management Education  Visit Type: Follow-up  Appt. Start Time: 10:45 Appt. End Time: 11:15  02/03/2024  Brenda [name], identified by name and date of birth, is a 63 y.o. female with a diagnosis of Diabetes:  .   ASSESSMENT  Last menstrual period 10/30/2014. There is no height or weight on file to calculate BMI.   Diabetes Self-Management Education - 02/03/24 1210       Visit Information   Visit Type Follow-up      Health Coping   How would you rate your overall health? Excellent      Psychosocial Assessment   Patient Belief/Attitude about Diabetes Motivated to manage diabetes    What is the hardest part about your diabetes right  now, causing you the most concern, or is the most worrisome to you about your diabetes?   Making healty food and beverage choices;Being active    Self-care barriers None    Self-management support Family    Patient Concerns Nutrition/Meal planning    Special Needs None    Preferred Learning Style Visual    Learning Readiness Change in progress    How often do you need to have someone help you when you read instructions, pamphlets, or other written materials from your doctor or pharmacy? 1 - Never      Pre-Education Assessment   Patient understands the diabetes disease and treatment process. Demonstrates understanding / competency    Patient understands incorporating nutritional management into lifestyle. Demonstrates understanding / competency    Patient undertands incorporating physical activity into lifestyle. Demonstrates understanding / competency    Patient understands using medications safely. Demonstrates understanding / competency    Patient understands monitoring blood glucose, interpreting and using results Demonstrates understanding / competency    Patient understands prevention, detection, and treatment of acute complications. Demonstrates understanding / competency    Patient understands prevention, detection, and treatment of chronic complications. Demonstrates understanding / competency    Patient understands how to develop strategies to address psychosocial issues. Demonstrates understanding / competency    Patient understands how to develop strategies to promote health/change behavior. Demonstrates understanding / competency      Complications  How often do you check your blood sugar? 3-4 times/day    Fasting Blood glucose range (mg/dL) 272-536    Postprandial Blood glucose range (mg/dL) 644-034    Number of hypoglycemic episodes per month 0    Have you had a dilated eye exam in the past 12 months? Yes    Have you had a dental exam in the past 12 months? Yes    Are you  checking your feet? Yes    How many days per week are you checking your feet? 7      Activity / Exercise   Activity / Exercise Type ADL's    How many days per week do you exercise? 0    How many minutes per day do you exercise? 0    Total minutes per week of exercise 0      Patient Education   Previous Diabetes Education Yes (please comment)    Diabetes Stress and Support Identified and addressed patients feelings and concerns about diabetes;Worked with patient to identify barriers to care and solutions;Role of stress on diabetes;Helped patient identify a support system for diabetes management;Brainstormed with patient on coping mechanisms for social situations, getting support from significant others, dealing with feelings about diabetes    Lifestyle and Health Coping Lifestyle issues that need to be addressed for better diabetes care      Individualized Goals (developed by patient)   Medications take my medication as prescribed    Monitoring  Consistenly use CGM      Patient Self-Evaluation of Goals - Patient rates self as meeting previously set goals (% of time)   Nutrition >75% (most of the time)    Physical Activity >75% (most of the time)    Medications >75% (most of the time)    Monitoring >75% (most of the time)    Problem Solving and behavior change strategies  >75% (most of the time)    Reducing Risk (treating acute and chronic complications) >75% (most of the time)    Health Coping >75% (most of the time)      Post-Education Assessment   Patient understands the diabetes disease and treatment process. Demonstrates understanding / competency    Patient understands incorporating nutritional management into lifestyle. Demonstrates understanding / competency    Patient undertands incorporating physical activity into lifestyle. Demonstrates understanding / competency    Patient understands using medications safely. Demonstrates understanding / competency    Patient understands  monitoring blood glucose, interpreting and using results Demonstrates understanding / competency    Patient understands prevention, detection, and treatment of acute complications. Demonstrates understanding / competency    Patient understands prevention, detection, and treatment of chronic complications. Demonstrates understanding / competency    Patient understands how to develop strategies to address psychosocial issues. Demonstrates understanding / competency    Patient understands how to develop strategies to promote health/change behavior. Demonstrates understanding / competency      Outcomes   Expected Outcomes Demonstrated interest in learning. Expect positive outcomes    Future DMSE 3-4 months    Program Status Completed      Subsequent Visit   Since your last visit have you continued or begun to take your medications as prescribed? Yes             Individualized Plan for Diabetes Self-Management Training:   Learning Objective:  Patient will have a greater understanding of diabetes self-management. Patient education plan is to attend individual and/or group sessions per assessed needs and concerns.  Expected Outcomes:  Demonstrated interest in learning. Expect positive outcomes  Education material provided: Food label handouts, My Plate, and Snack sheet  If problems or questions, patient to contact team via:  Phone and Email  Future DSME appointment: 3-4 months

## 2024-02-03 NOTE — Patient Instructions (Signed)
-  RESPIRATORY FAILURE: Respiratory failure means your lungs are not getting enough oxygen  into your blood. You should continue using your continuous oxygen  therapy at 2 liters per minute to keep your oxygen  levels above 90%.   -OBSTRUCTIVE SLEEP APNEA: Obstructive sleep apnea is a condition where your breathing stops and starts during sleep. Your condition is well-controlled with your CPAP therapy, and you should continue using it with the current settings. We will monitor for any air leaks and address them if they become problematic.  -BURSITIS: Bursitis is the inflammation of the fluid-filled sacs that cushion your joints, causing pain. You are scheduled for an MRI on Thursday to further evaluate this condition.  -PERNICIOUS ANEMIA: Pernicious anemia is a condition where your body can't make enough healthy red blood cells because it can't absorb enough vitamin B12. Your primary care provider will continue to manage this condition.   Follow-up 1 year with [facility] NP or sooner if needed

## 2024-02-04 ENCOUNTER — Encounter: Payer: Self-pay | Admitting: Physician Assistant

## 2024-02-05 ENCOUNTER — Other Ambulatory Visit: Payer: Self-pay

## 2024-02-05 ENCOUNTER — Ambulatory Visit: Admitting: Podiatry

## 2024-02-05 ENCOUNTER — Telehealth: Payer: Self-pay | Admitting: Podiatry

## 2024-02-05 ENCOUNTER — Encounter: Payer: Self-pay | Admitting: Podiatry

## 2024-02-05 ENCOUNTER — Telehealth: Payer: Self-pay

## 2024-02-05 DIAGNOSIS — M792 Neuralgia and neuritis, unspecified: Secondary | ICD-10-CM

## 2024-02-05 MED ORDER — PREGABALIN 100 MG PO CAPS: 100.0000 mg | ORAL_CAPSULE | Freq: Three times a day (TID) | ORAL | 1 refills | Status: DC

## 2024-02-05 MED ORDER — PREGABALIN 100 MG PO CAPS: 100.0000 mg | ORAL_CAPSULE | Freq: Three times a day (TID) | ORAL | 6 refills | Status: DC

## 2024-02-05 MED ORDER — PREGABALIN 100 MG PO CAPS
100.0000 mg | ORAL_CAPSULE | Freq: Three times a day (TID) | ORAL | 6 refills | Status: DC
Start: 2024-02-05 — End: 2024-02-05

## 2024-02-05 MED ORDER — PREGABALIN 100 MG PO CAPS
100.0000 mg | ORAL_CAPSULE | Freq: Three times a day (TID) | ORAL | 1 refills | Status: DC
Start: 2024-02-05 — End: 2024-03-12

## 2024-02-05 NOTE — Progress Notes (Signed)
 Patient presents for follow-up neuropathy.  She has been doing well with her current dose of the Lyrica 100 mg 1 p.o. 3 times daily.  No side effects noted.   Physical exam:  General appearance: Pleasant, and in no acute distress. AOx3.  Vascular: Pedal pulses: DP 2/4, PT 0/4.  Moderate edema lower legs bilaterally. Capillary fill time immediate.  Neurological: Decreased vibratory sensation in feet bilaterally.  Used Achilles reflex bilaterally.  No clonus or spasticity noted.  Negative Tinel's sign tarsal tunnel and porta pedis bilaterally  Dermatologic:   Skin normal temperature bilaterally.  Skin normal color, tone, and texture bilaterally.   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  Plan: -Established office visit level 3 for evaluation and management. - Doing well on current dose of Lyrica we will continue her on the same dose 300 mg 1 p.o. 3 times daily.  She has any problems in the interim she should call us  -Rx Lyrica 100 mg 1 p.o. 3 times daily x 6 refills   Return 6 months follow-up neuropathy

## 2024-02-05 NOTE — Telephone Encounter (Signed)
     Can you resend to Pharmacy, they mis-placed script.  pregabalin (LYRICA) 100 MG capsule

## 2024-02-05 NOTE — Addendum Note (Signed)
 Addended by: Reche Canales on: 02/05/2024 05:03 PM   Modules accepted: Orders

## 2024-02-05 NOTE — Addendum Note (Signed)
 Addended by: Paula Born on: 02/05/2024 05:18 PM   Modules accepted: Orders

## 2024-02-05 NOTE — Telephone Encounter (Signed)
 Brenda [name], Pharmacist is asking can you resend pregabalin (LYRICA) 100 MG capsule they mis-placed script. He said he printed it went back to the profile and it wasn't there.    [name] already tried to send it twice but I have tried to send it twice but It won't let me send it, only print

## 2024-02-06 ENCOUNTER — Ambulatory Visit
Admission: RE | Admit: 2024-02-06 | Discharge: 2024-02-06 | Disposition: A | Discharge status: Home / Self Care | Source: Ambulatory Visit | Attending: Physician Assistant | Admitting: Physician Assistant

## 2024-02-06 DIAGNOSIS — M25552 Pain in left hip: Secondary | ICD-10-CM

## 2024-02-12 ENCOUNTER — Ambulatory Visit: Admitting: Nurse Practitioner

## 2024-03-03 DIAGNOSIS — M79652 Pain in left thigh: Secondary | ICD-10-CM | POA: Insufficient documentation

## 2024-03-03 DIAGNOSIS — K625 Hemorrhage of anus and rectum: Secondary | ICD-10-CM | POA: Insufficient documentation

## 2024-03-11 NOTE — Progress Notes (Signed)
 Radiation Oncology         [phone] ________________________________  Name: Brenda [name] MRN: [id number]  Date: 03/12/2024  DOB: June 10, 1961  Follow-Up Visit Note  CC: Tann, Samandra, NP  Delbert Clam, MD  No diagnosis found. 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 She was last seen in office on 09/12/23 for a follow up visit. Patient continued to follow up with their specialists to manage their chronic conditions. She has been diagnosed with bursitis of her left hip.   She underwent a bilateral screening mammogram on 10/03/23 which showed no mammographic evidence of malignancy.  In the interval since she was last seen, patient presented for a follow up with Dr. Viktoria on 12/06/23 where she was noted as NED on examination. No symptoms or concerns indication disease recurrence.                     No other significant oncologic interval history since the patient was last seen.   Patient is doing well overall today. She denies any abdominal pain, abnormal bloating, vaginal bleeding, or changes in her bowel or bladder habits. She is using her dilators regularly. ***                               Allergies:  is allergic to hydrocodone, clopidogrel, and clopidogrel bisulfate.  Meds: Current Outpatient Medications  Medication Sig Dispense Refill   atorvastatin  (LIPITOR ) 80 MG tablet Take 1 tablet (80 mg total) by mouth every evening. 30 tablet 6   buPROPion (WELLBUTRIN XL) 150 MG 24 hr tablet Take  150 mg by mouth daily.     Cholecalciferol 50 MCG (2000 UT) CAPS 1 capsule Orally Once a day     FIASP  FLEXTOUCH 100 UNIT/ML FlexTouch Pen Inject 10 Units into the skin 2 (two) times daily.     FLUoxetine  (PROZAC ) 40 MG capsule Take 40 mg by mouth daily.     furosemide  (LASIX ) 40 MG tablet Take 1 tablet by mouth once daily 30 tablet 0   insulin  degludec (TRESIBA) 100 UNIT/ML FlexTouch Pen Inject 20 Units into the skin daily.     losartan  (COZAAR ) 25 MG tablet Take 1 tablet (25 mg total) by mouth daily. 90 tablet 3   metFORMIN (GLUCOPHAGE-XR) 500 MG 24 hr tablet Take 500 mg by mouth 3 (three) times daily.     methocarbamol (ROBAXIN) 500 MG tablet Take 500-1,000 mg by mouth 3 (three) times daily as needed.     Misc. Devices MISC Portable oxygen  concentrator, 2L oxygen .  Diagnoses-chronic respiratory failure with hypoxia 1 each 0   nitroGLYCERIN  (NITROSTAT ) 0.4 MG SL tablet Dissolve 1 tablet under the tongue every 5 minutes as needed for chest pain. Max of 3 doses, then 911. 25 tablet 6   polyethylene glycol powder (GLYCOLAX /MIRALAX ) 17 GM/SCOOP powder Take 17 g by mouth daily.     prasugrel  (EFFIENT ) 5 MG TABS tablet Take 5 mg by mouth daily.     pregabalin  (  LYRICA ) 100 MG capsule Take 100 mg by mouth 3 (three) times daily.     pregabalin  (LYRICA ) 100 MG capsule Take 1 capsule (100 mg total) by mouth 3 (three) times daily. 90 capsule 6   pregabalin  (LYRICA ) 100 MG capsule Take 1 capsule (100 mg total) by mouth 3 (three) times daily. 270 capsule 1   pregabalin  (LYRICA ) 100 MG capsule Take 1 capsule (100 mg total) by mouth 3 (three) times daily. 270 capsule 1   Respiratory Therapy Supplies (CARETOUCH 2 CPAP HOSE HANGER) MISC daily at night     WEGOVY  0.25 MG/0.5ML SOAJ SMARTSIG:0.25 Milligram(s) SUB-Q Once a Week     No current facility-administered medications for this encounter.    Physical Findings: The patient is in no acute distress. Patient is alert and oriented.  vitals were not taken for  this visit. .  No significant changes. Lungs are clear to auscultation bilaterally. Heart has regular rate and rhythm. No palpable cervical, supraclavicular, or axillary adenopathy. Abdomen soft, non-tender, normal bowel sounds.  On pelvic examination the external genitalia were unremarkable. A speculum exam was performed. There are no mucosal lesions noted in the vaginal vault. On bimanual and rectovaginal examination there were no pelvic masses appreciated. Vaginal cuff intact. Mild radiation changes noted at the cuff.  ***  Lab Findings: Lab Results  Component Value Date   WBC 9.4 04/19/2023   HGB 14.8 04/19/2023   HCT 45.2 04/19/2023   MCV 87.9 04/19/2023   PLT 307 04/19/2023    Radiographic Findings: No results found.  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 ***  Plan:  Per NCCN guidelines, patient will follow up with Dr. Viktoria in 3 months and radiation oncology in 6 months. Patient was educated on signs/symptoms that may indicate disease recurrence and understands to notify us  if she experiences any of them.      *** minutes of total time was spent for this patient encounter, including preparation, face-to-face counseling with the patient and coordination of care, physical exam, and documentation of the encounter. ____________________________________   Leeroy Due, PA-C   Lynwood CHARM Nasuti, PhD, MD   [facility]  Radiation Oncology Direct Dial: [phone]  Fax: [phone] [email or web address]    This document serves as a record of services personally performed by Lynwood Nasuti, MD. It was created on his behalf by Reymundo Cartwright, a trained medical scribe. The creation of this record is based on the scribe's personal  observations and the provider's statements to them. This document has been checked and approved by the attending provider.

## 2024-03-12 ENCOUNTER — Ambulatory Visit
Admission: RE | Admit: 2024-03-12 | Discharge: 2024-03-12 | Disposition: A | Discharge status: Home / Self Care | Payer: Self-pay | Source: Ambulatory Visit | Attending: Radiation Oncology | Admitting: Radiation Oncology

## 2024-03-12 ENCOUNTER — Encounter: Payer: Self-pay | Admitting: Radiation Oncology

## 2024-03-12 VITALS — BP 133/80 | HR 97 | Temp 96.8°F | Resp 18 | Ht 62.0 in | Wt 200.4 lb

## 2024-03-12 DIAGNOSIS — Z90710 Acquired absence of both cervix and uterus: Secondary | ICD-10-CM | POA: Diagnosis not present

## 2024-03-12 DIAGNOSIS — Z923 Personal history of irradiation: Secondary | ICD-10-CM | POA: Diagnosis not present

## 2024-03-12 DIAGNOSIS — Z7902 Long term (current) use of antithrombotics/antiplatelets: Secondary | ICD-10-CM | POA: Diagnosis not present

## 2024-03-12 DIAGNOSIS — Z8542 Personal history of malignant neoplasm of other parts of uterus: Secondary | ICD-10-CM | POA: Diagnosis present

## 2024-03-12 DIAGNOSIS — Z90722 Acquired absence of ovaries, bilateral: Secondary | ICD-10-CM | POA: Diagnosis not present

## 2024-03-12 DIAGNOSIS — M7072 Other bursitis of hip, left hip: Secondary | ICD-10-CM | POA: Insufficient documentation

## 2024-03-12 DIAGNOSIS — C541 Malignant neoplasm of endometrium: Secondary | ICD-10-CM

## 2024-03-12 DIAGNOSIS — Z79899 Other long term (current) drug therapy: Secondary | ICD-10-CM | POA: Insufficient documentation

## 2024-03-12 NOTE — Progress Notes (Signed)
 Brenda [name] is here today for follow up post radiation to the pelvic.  They completed their radiation on: 03/07/22   Does the patient complain of any of the following:  Pain: Yes, reports back and left hip pain. Rates 8/10.  Abdominal bloating: No Diarrhea/Constipation: Yes,diarrhea due to taking metformin.  Nausea/Vomiting: No Vaginal Discharge: No Blood in Urine or Stool: No Urinary Issues (dysuria/incomplete emptying/ incontinence/ increased frequency/urgency): Patient recently completed antibiotics last weeks for UTI. Does patient report using vaginal dilator 2-3 times a week and/or sexually active 2-3 weeks: Yes, using vaginal dilators.  Post radiation skin changes: No   Additional comments if applicable:  BP 133/80 (BP Location: Right Arm, Patient Position: Sitting)   Pulse 97   Temp (!) 96.8 F (36 C) (Temporal)   Resp 18   Ht 5' 2 (1.575 m)   Wt 200 lb 6 oz (90.9 kg)   LMP 10/30/2014 (Approximate)   SpO2 100%   BMI 36.65 kg/m

## 2024-03-26 ENCOUNTER — Other Ambulatory Visit: Payer: Self-pay | Admitting: Physician Assistant

## 2024-03-26 ENCOUNTER — Ambulatory Visit
Admission: RE | Admit: 2024-03-26 | Discharge: 2024-03-26 | Disposition: A | Discharge status: Home / Self Care | Source: Ambulatory Visit | Attending: Physician Assistant | Admitting: Physician Assistant

## 2024-03-26 DIAGNOSIS — M79652 Pain in left thigh: Secondary | ICD-10-CM

## 2024-03-29 ENCOUNTER — Encounter (HOSPITAL_COMMUNITY): Payer: Self-pay | Admitting: Emergency Medicine

## 2024-03-29 ENCOUNTER — Emergency Department (HOSPITAL_COMMUNITY)

## 2024-03-29 ENCOUNTER — Other Ambulatory Visit: Payer: Self-pay

## 2024-03-29 ENCOUNTER — Observation Stay (HOSPITAL_COMMUNITY)
Admission: EM | Admit: 2024-03-29 | Discharge: 2024-03-30 | Disposition: A | Discharge status: Home / Self Care | Attending: Internal Medicine | Admitting: Internal Medicine

## 2024-03-29 DIAGNOSIS — I5030 Unspecified diastolic (congestive) heart failure: Secondary | ICD-10-CM | POA: Diagnosis present

## 2024-03-29 DIAGNOSIS — G893 Neoplasm related pain (acute) (chronic): Secondary | ICD-10-CM | POA: Diagnosis not present

## 2024-03-29 DIAGNOSIS — M5459 Other low back pain: Secondary | ICD-10-CM | POA: Diagnosis not present

## 2024-03-29 DIAGNOSIS — Z79899 Other long term (current) drug therapy: Secondary | ICD-10-CM | POA: Insufficient documentation

## 2024-03-29 DIAGNOSIS — J9611 Chronic respiratory failure with hypoxia: Secondary | ICD-10-CM | POA: Diagnosis present

## 2024-03-29 DIAGNOSIS — E66811 Obesity, class 1: Secondary | ICD-10-CM | POA: Diagnosis present

## 2024-03-29 DIAGNOSIS — C799 Secondary malignant neoplasm of unspecified site: Secondary | ICD-10-CM | POA: Diagnosis present

## 2024-03-29 DIAGNOSIS — I110 Hypertensive heart disease with heart failure: Secondary | ICD-10-CM | POA: Diagnosis not present

## 2024-03-29 DIAGNOSIS — I5032 Chronic diastolic (congestive) heart failure: Secondary | ICD-10-CM | POA: Diagnosis not present

## 2024-03-29 DIAGNOSIS — Z87891 Personal history of nicotine dependence: Secondary | ICD-10-CM | POA: Diagnosis not present

## 2024-03-29 DIAGNOSIS — Z794 Long term (current) use of insulin: Secondary | ICD-10-CM | POA: Diagnosis not present

## 2024-03-29 DIAGNOSIS — C786 Secondary malignant neoplasm of retroperitoneum and peritoneum: Secondary | ICD-10-CM | POA: Diagnosis not present

## 2024-03-29 DIAGNOSIS — E785 Hyperlipidemia, unspecified: Secondary | ICD-10-CM | POA: Diagnosis not present

## 2024-03-29 DIAGNOSIS — F32A Depression, unspecified: Secondary | ICD-10-CM | POA: Diagnosis not present

## 2024-03-29 DIAGNOSIS — I2510 Atherosclerotic heart disease of native coronary artery without angina pectoris: Secondary | ICD-10-CM | POA: Diagnosis present

## 2024-03-29 DIAGNOSIS — F419 Anxiety disorder, unspecified: Secondary | ICD-10-CM | POA: Diagnosis present

## 2024-03-29 DIAGNOSIS — Z955 Presence of coronary angioplasty implant and graft: Secondary | ICD-10-CM | POA: Diagnosis not present

## 2024-03-29 DIAGNOSIS — G4733 Obstructive sleep apnea (adult) (pediatric): Secondary | ICD-10-CM | POA: Diagnosis present

## 2024-03-29 DIAGNOSIS — Z8542 Personal history of malignant neoplasm of other parts of uterus: Secondary | ICD-10-CM | POA: Diagnosis not present

## 2024-03-29 DIAGNOSIS — M5442 Lumbago with sciatica, left side: Principal | ICD-10-CM

## 2024-03-29 DIAGNOSIS — E118 Type 2 diabetes mellitus with unspecified complications: Secondary | ICD-10-CM

## 2024-03-29 DIAGNOSIS — C541 Malignant neoplasm of endometrium: Secondary | ICD-10-CM | POA: Diagnosis present

## 2024-03-29 DIAGNOSIS — J9610 Chronic respiratory failure, unspecified whether with hypoxia or hypercapnia: Secondary | ICD-10-CM | POA: Diagnosis present

## 2024-03-29 DIAGNOSIS — F418 Other specified anxiety disorders: Secondary | ICD-10-CM | POA: Diagnosis present

## 2024-03-29 DIAGNOSIS — E1169 Type 2 diabetes mellitus with other specified complication: Secondary | ICD-10-CM | POA: Diagnosis present

## 2024-03-29 DIAGNOSIS — C7951 Secondary malignant neoplasm of bone: Secondary | ICD-10-CM

## 2024-03-29 DIAGNOSIS — M545 Low back pain, unspecified: Secondary | ICD-10-CM | POA: Diagnosis present

## 2024-03-29 DIAGNOSIS — I10 Essential (primary) hypertension: Secondary | ICD-10-CM | POA: Diagnosis present

## 2024-03-29 LAB — CBC WITH DIFFERENTIAL/PLATELET
Abs Immature Granulocytes: 0.06 K/uL (ref 0.00–0.07)
Basophils Absolute: 0 K/uL (ref 0.0–0.1)
Basophils Relative: 0 %
Eosinophils Absolute: 0.3 K/uL (ref 0.0–0.5)
Eosinophils Relative: 2 %
HCT: 30.7 % — ABNORMAL LOW (ref 36.0–46.0)
Hemoglobin: 9.7 g/dL — ABNORMAL LOW (ref 12.0–15.0)
Immature Granulocytes: 0 %
Lymphocytes Relative: 11 %
Lymphs Abs: 1.6 K/uL (ref 0.7–4.0)
MCH: 26.6 pg (ref 26.0–34.0)
MCHC: 31.6 g/dL (ref 30.0–36.0)
MCV: 84.3 fL (ref 80.0–100.0)
Monocytes Absolute: 0.7 K/uL (ref 0.1–1.0)
Monocytes Relative: 5 %
Neutro Abs: 11.6 K/uL — ABNORMAL HIGH (ref 1.7–7.7)
Neutrophils Relative %: 82 %
Platelets: 293 K/uL (ref 150–400)
RBC: 3.64 MIL/uL — ABNORMAL LOW (ref 3.87–5.11)
RDW: 13.2 % (ref 11.5–15.5)
WBC: 14.3 K/uL — ABNORMAL HIGH (ref 4.0–10.5)
nRBC: 0 % (ref 0.0–0.2)

## 2024-03-29 LAB — RETICULOCYTES
Immature Retic Fract: 15.7 % (ref 2.3–15.9)
RBC.: 3.49 MIL/uL — ABNORMAL LOW (ref 3.87–5.11)
Retic Count, Absolute: 59.3 K/uL (ref 19.0–186.0)
Retic Ct Pct: 1.7 % (ref 0.4–3.1)

## 2024-03-29 LAB — BASIC METABOLIC PANEL WITH GFR
Anion gap: 13 (ref 5–15)
BUN: 13 mg/dL (ref 6–20)
CO2: 22 mmol/L (ref 22–32)
Calcium: 8.8 mg/dL — ABNORMAL LOW (ref 8.9–10.3)
Chloride: 99 mmol/L (ref 98–111)
Creatinine, Ser: 0.5 mg/dL (ref 0.44–1.00)
GFR, Estimated: >60 mL/min (ref >60)
Glucose, Bld: 220 mg/dL — ABNORMAL HIGH (ref 70–99)
Potassium: 3.8 mmol/L (ref 3.5–5.1)
Sodium: 134 mmol/L — ABNORMAL LOW (ref 135–145)

## 2024-03-29 LAB — HEMOGLOBIN A1C
Hgb A1c MFr Bld: 9.2 % — ABNORMAL HIGH (ref 4.8–5.6)
Mean Plasma Glucose: 217.34 mg/dL

## 2024-03-29 LAB — HIV ANTIBODY (ROUTINE TESTING W REFLEX): HIV Screen 4th Generation wRfx: NONREACTIVE

## 2024-03-29 LAB — IRON AND TIBC
Iron: 20 ug/dL — ABNORMAL LOW (ref 28–170)
Saturation Ratios: 9 % — ABNORMAL LOW (ref 10.4–31.8)
TIBC: 224 ug/dL — ABNORMAL LOW (ref 250–450)
UIBC: 204 ug/dL

## 2024-03-29 LAB — FOLATE: Folate: 10.2 ng/mL (ref >5.9)

## 2024-03-29 LAB — FERRITIN: Ferritin: 157 ng/mL (ref 11–307)

## 2024-03-29 LAB — HEPATIC FUNCTION PANEL
ALT: 10 U/L (ref 0–44)
AST: 12 U/L — ABNORMAL LOW (ref 15–41)
Albumin: 2.6 g/dL — ABNORMAL LOW (ref 3.5–5.0)
Alkaline Phosphatase: 80 U/L (ref 38–126)
Bilirubin, Direct: <0.1 mg/dL (ref 0.0–0.2)
Total Bilirubin: 0.9 mg/dL (ref 0.0–1.2)
Total Protein: 6.4 g/dL — ABNORMAL LOW (ref 6.5–8.1)

## 2024-03-29 LAB — VITAMIN B12: Vitamin B-12: 487 pg/mL (ref 180–914)

## 2024-03-29 LAB — GLUCOSE, CAPILLARY
Glucose-Capillary: 205 mg/dL — ABNORMAL HIGH (ref 70–99)
Glucose-Capillary: 378 mg/dL — ABNORMAL HIGH (ref 70–99)
Glucose-Capillary: 316 mg/dL — ABNORMAL HIGH (ref 70–99)
Glucose-Capillary: 321 mg/dL — ABNORMAL HIGH (ref 70–99)

## 2024-03-29 MED ORDER — ALBUTEROL SULFATE (2.5 MG/3ML) 0.083% IN NEBU: 2.5000 mg | INHALATION_SOLUTION | RESPIRATORY_TRACT | Status: DC | PRN

## 2024-03-29 MED ORDER — ENOXAPARIN SODIUM 40 MG/0.4ML IJ SOSY
40.0000 mg | PREFILLED_SYRINGE | Freq: Every day | INTRAMUSCULAR | Status: DC
  Administered 2024-03-29: 40 mg via SUBCUTANEOUS
  Filled 2024-03-29 (×2): qty 0.4

## 2024-03-29 MED ORDER — SODIUM CHLORIDE 0.9% FLUSH: 3.0000 mL | INTRAVENOUS | Status: DC | PRN

## 2024-03-29 MED ORDER — HYDROMORPHONE HCL 1 MG/ML IJ SOLN
0.5000 mg | Freq: Once | INTRAMUSCULAR | Status: AC
  Administered 2024-03-29: 0.5 mg via INTRAVENOUS
  Filled 2024-03-29: qty 1

## 2024-03-29 MED ORDER — DOCUSATE SODIUM 100 MG PO CAPS
100.0000 mg | ORAL_CAPSULE | Freq: Two times a day (BID) | ORAL | Status: DC
  Administered 2024-03-29 – 2024-03-30 (×2): 100 mg via ORAL
  Filled 2024-03-29 (×2): qty 1

## 2024-03-29 MED ORDER — METHOCARBAMOL 500 MG PO TABS
500.0000 mg | ORAL_TABLET | Freq: Three times a day (TID) | ORAL | Status: DC
  Administered 2024-03-29 – 2024-03-30 (×3): 500 mg via ORAL
  Filled 2024-03-29 (×3): qty 1

## 2024-03-29 MED ORDER — IBUPROFEN 200 MG PO TABS
400.0000 mg | ORAL_TABLET | Freq: Once | ORAL | Status: AC
  Administered 2024-03-29: 400 mg via ORAL
  Filled 2024-03-29: qty 2

## 2024-03-29 MED ORDER — FUROSEMIDE 40 MG PO TABS
40.0000 mg | ORAL_TABLET | Freq: Every day | ORAL | Status: DC
  Administered 2024-03-29 – 2024-03-30 (×2): 40 mg via ORAL
  Filled 2024-03-29 (×2): qty 1

## 2024-03-29 MED ORDER — SODIUM CHLORIDE 0.9% FLUSH
3.0000 mL | Freq: Two times a day (BID) | INTRAVENOUS | Status: DC
  Administered 2024-03-29 – 2024-03-30 (×3): 3 mL via INTRAVENOUS

## 2024-03-29 MED ORDER — INSULIN GLARGINE-YFGN 100 UNIT/ML ~~LOC~~ SOLN
15.0000 [IU] | Freq: Every day | SUBCUTANEOUS | Status: DC
  Administered 2024-03-29: 15 [IU] via SUBCUTANEOUS
  Filled 2024-03-29 (×2): qty 0.15

## 2024-03-29 MED ORDER — SODIUM CHLORIDE 0.9% FLUSH
3.0000 mL | Freq: Two times a day (BID) | INTRAVENOUS | Status: DC
  Administered 2024-03-29 – 2024-03-30 (×2): 3 mL via INTRAVENOUS

## 2024-03-29 MED ORDER — BUPROPION HCL ER (XL) 150 MG PO TB24
150.0000 mg | ORAL_TABLET | Freq: Every day | ORAL | Status: DC
  Administered 2024-03-29 – 2024-03-30 (×2): 150 mg via ORAL
  Filled 2024-03-29 (×2): qty 1

## 2024-03-29 MED ORDER — VITAMIN D3 25 MCG (1000 UNIT) PO TABS
2000.0000 [IU] | ORAL_TABLET | Freq: Every day | ORAL | Status: DC
  Administered 2024-03-30: 2000 [IU] via ORAL
  Filled 2024-03-29: qty 2

## 2024-03-29 MED ORDER — OXYCODONE HCL 5 MG PO TABS
5.0000 mg | ORAL_TABLET | Freq: Once | ORAL | Status: AC
  Administered 2024-03-29: 5 mg via ORAL
  Filled 2024-03-29: qty 1

## 2024-03-29 MED ORDER — INSULIN GLARGINE-YFGN 100 UNIT/ML ~~LOC~~ SOPN: 15.0000 [IU] | PEN_INJECTOR | Freq: Every day | SUBCUTANEOUS | Status: DC

## 2024-03-29 MED ORDER — PREDNISOLONE ACETATE 1 % OP SUSP
1.0000 [drp] | Freq: Every day | OPHTHALMIC | Status: DC
  Filled 2024-03-29: qty 5

## 2024-03-29 MED ORDER — IOHEXOL 300 MG/ML  SOLN
100.0000 mL | Freq: Once | INTRAMUSCULAR | Status: AC | PRN
  Administered 2024-03-29: 100 mL via INTRAVENOUS

## 2024-03-29 MED ORDER — INSULIN ASPART 100 UNIT/ML IJ SOLN
0.0000 [IU] | Freq: Every day | INTRAMUSCULAR | Status: DC
  Administered 2024-03-29: 4 [IU] via SUBCUTANEOUS

## 2024-03-29 MED ORDER — SODIUM CHLORIDE 0.9 % IV SOLN: 250.0000 mL | INTRAVENOUS | Status: AC | PRN

## 2024-03-29 MED ORDER — PREGABALIN 50 MG PO CAPS
100.0000 mg | ORAL_CAPSULE | Freq: Three times a day (TID) | ORAL | Status: DC
Start: 2024-03-29 — End: 2024-03-30
  Administered 2024-03-29 – 2024-03-30 (×3): 100 mg via ORAL
  Filled 2024-03-29 (×3): qty 2

## 2024-03-29 MED ORDER — OXYCODONE HCL 5 MG PO TABS
5.0000 mg | ORAL_TABLET | ORAL | Status: DC | PRN
  Administered 2024-03-30 (×2): 5 mg via ORAL
  Filled 2024-03-29 (×2): qty 1

## 2024-03-29 MED ORDER — KETOROLAC TROMETHAMINE 15 MG/ML IJ SOLN
15.0000 mg | Freq: Once | INTRAMUSCULAR | Status: AC
  Administered 2024-03-29: 15 mg via INTRAVENOUS
  Filled 2024-03-29: qty 1

## 2024-03-29 MED ORDER — INSULIN ASPART 100 UNIT/ML IJ SOLN
0.0000 [IU] | Freq: Three times a day (TID) | INTRAMUSCULAR | Status: DC
  Administered 2024-03-29: 15 [IU] via SUBCUTANEOUS
  Administered 2024-03-30: 5 [IU] via SUBCUTANEOUS
  Administered 2024-03-30: 3 [IU] via SUBCUTANEOUS

## 2024-03-29 MED ORDER — PREDNISOLONE ACETATE 1 % OP SUSP
1.0000 [drp] | Freq: Two times a day (BID) | OPHTHALMIC | Status: DC
  Administered 2024-03-29 – 2024-03-30 (×2): 1 [drp] via OPHTHALMIC
  Filled 2024-03-29: qty 5

## 2024-03-29 MED ORDER — CIPROFLOXACIN HCL 500 MG PO TABS
500.0000 mg | ORAL_TABLET | Freq: Two times a day (BID) | ORAL | Status: DC
  Administered 2024-03-29 – 2024-03-30 (×2): 500 mg via ORAL
  Filled 2024-03-29 (×2): qty 1

## 2024-03-29 MED ORDER — LOSARTAN POTASSIUM 50 MG PO TABS
25.0000 mg | ORAL_TABLET | Freq: Every day | ORAL | Status: DC
Start: 2024-03-29 — End: 2024-03-30
  Administered 2024-03-29: 25 mg via ORAL
  Filled 2024-03-29: qty 1

## 2024-03-29 MED ORDER — ONDANSETRON HCL 4 MG/2ML IJ SOLN: 4.0000 mg | Freq: Four times a day (QID) | INTRAMUSCULAR | Status: DC | PRN

## 2024-03-29 MED ORDER — FLUOXETINE HCL 20 MG PO CAPS
40.0000 mg | ORAL_CAPSULE | Freq: Every day | ORAL | Status: DC
  Administered 2024-03-29 – 2024-03-30 (×2): 40 mg via ORAL
  Filled 2024-03-29 (×2): qty 2

## 2024-03-29 MED ORDER — HYDROMORPHONE HCL 1 MG/ML IJ SOLN: 0.5000 mg | INTRAMUSCULAR | Status: DC | PRN

## 2024-03-29 MED ORDER — ACETAMINOPHEN 500 MG PO TABS
1000.0000 mg | ORAL_TABLET | Freq: Once | ORAL | Status: AC
  Administered 2024-03-29: 1000 mg via ORAL
  Filled 2024-03-29: qty 2

## 2024-03-29 MED ORDER — ATORVASTATIN CALCIUM 40 MG PO TABS
80.0000 mg | ORAL_TABLET | Freq: Every day | ORAL | Status: DC
  Administered 2024-03-29: 80 mg via ORAL
  Filled 2024-03-29: qty 2

## 2024-03-29 MED ORDER — POLYETHYLENE GLYCOL 3350 17 G PO PACK: 17.0000 g | PACK | Freq: Every day | ORAL | Status: DC | PRN

## 2024-03-29 MED ORDER — ONDANSETRON HCL 4 MG PO TABS: 4.0000 mg | ORAL_TABLET | Freq: Four times a day (QID) | ORAL | Status: DC | PRN

## 2024-03-29 MED ORDER — NALOXONE HCL 0.4 MG/ML IJ SOLN: 0.4000 mg | INTRAMUSCULAR | Status: DC | PRN

## 2024-03-29 NOTE — H&P (Addendum)
 History and Physical    Brenda [name] FMW:[id number] DOB: 1960-09-09 DOA: 03/29/2024  PCP: [name], PA-C   I have briefly reviewed patients previous medical reports in [facility].  Patient coming from: Home  Chief Complaint: Subacute on chronic left low back pain/intractable back pain.  HPI: Brenda [name] is a 63 year old married female, independent, medical history significant for endometrial adenocarcinoma s/p total hysterectomy, BSO and vaginal brachytherapy completed 03/07/2022 with ongoing surveillance with GYN and radiation oncology, CAD, NSTEMI, s/p DES to mid LAD 10/2019, chronic HFpEF, chronic respiratory failure on home oxygen  2 L/min continuously, OSA on nightly BiPAP, type II DM, HTN, HLD, morbid obesity, recent treatment for UTI, recent laser treatment right eye, presented to ED with subacute on chronic intractable left-sided low back pain.  Patient reports approximately 64-month history of left-sided low back pain.  Seen by orthopedics in May 2025 and told to have bursitis but avoided steroid injections due to history of diabetes.  Since then, reports progressively worsening low back pain, more so in the last week or so, almost constant, severe, intermittent sharp shooting pains to left anterior thigh up to the knee.  Has chronic lower extremity numbness from diabetic peripheral neuropathy which has not changed.  Reports some weakness in the left leg.  No sphincter issues, has chronic diarrhea.  States that she saw her PCP multiple times, most recently on 7/24 and she was going to be referred to neurology and pain management for further evaluation and management.  At that visit, patient also reported urinary urgency, frequency and dysuria and was started on antibiotics for UTI.  After returning home from that visit, she states that her leg gave away and she fell without sustaining injuries.  No syncope reported.  ED Course: Afebrile with stable vital signs.  Lab  work significant for serum sodium 134 not corrected to blood glucose of 220, WBC 14.3, hemoglobin 9.7.  CT A/P with contrast: New large left retroperitoneal mass involving the left psoas muscle and left paraaortic region with cortical destruction in the adjacent lumbar spine vertebral bodies, consistent with metastatic disease. Mild left paraaortic and left iliac lymphadenopathy, consistent with metastatic disease.  CT lumbar spine without contrast and CT pelvis without contrast: IMPRESSION: Retroperitoneal left paraspinous soft tissue mass measuring 7.5 x 5.5 x 8.5 cm, centered at the L2-3 level, encroaching on the lateral margin of the left foramen at L2-3. This is most concerning for metastatic disease. Primary sarcoma could have a similar appearance. Recommend CT of the chest, abdomen and pelvis with contrast for further evaluation   In ED, received a dose of IV Dilaudid  0.5 Mg, Advil  400 Mg, Toradol  15 Mg and oxycodone  5 Mg with improvement in pain.  EDP discussed with GYN oncologist on-call and patient admitted for further evaluation and management.  Review of Systems:  All other systems reviewed and apart from HPI, are negative.  Patient underwent laser treatment on right eye on 03/25/2024 with expected redness of the eye.  Past Medical History:  Diagnosis Date   Anemia    Arthritis    back, hands, hips (02/22/2015)   CAD (coronary artery disease)    a. complex LAD/diagonal bifurcation PCI in 2010. b. STEMI 10/2019 s/p PTCA/DES x1 to mLAD overlapping the old stent, residual disease treated medicaly.   Cancer [facility])    uterine   CHF (congestive heart failure) (HCC)    Depression    Diabetes mellitus (HCC)    started when I was pregnant;  not sure if it was type 1 or type 2    History of radiation therapy    Endometrium- HDR 01/22/22-03/07/22- Dr. Lynwood Nasuti   Hypercholesterolemia    Hypertension    Morbid obesity (HCC)    Myocardial infarction [facility])    mild x 3    Neuromuscular disorder (HCC)    neuropathy feet   Sleep apnea    cpap/    Past Surgical History:  Procedure Laterality Date   CARDIAC CATHETERIZATION  12/02/2012   CHOLECYSTECTOMY OPEN  09/04/1987   CORONARY ANGIOPLASTY WITH STENT PLACEMENT  09/03/2009   CORONARY/GRAFT ACUTE MI REVASCULARIZATION N/A 10/26/2019   Procedure: CORONARY/GRAFT ACUTE MI REVASCULARIZATION;  Surgeon: Verlin Lonni BIRCH, MD;  Location: [facility] INVASIVE CV LAB;  Service: Cardiovascular;  Laterality: N/A;   INCISIONAL HERNIA REPAIR N/A 12/09/2021   Procedure: LAPAROSCOPIC REPAIR UMBILICAL HERNIA WITH MESH;  Surgeon: Vernetta Berg, MD;  Location: [facility] ORS;  Service: General;  Laterality: N/A;   LEFT HEART CATH AND CORONARY ANGIOGRAPHY N/A 10/26/2019   Procedure: LEFT HEART CATH AND CORONARY ANGIOGRAPHY;  Surgeon: Verlin Lonni BIRCH, MD;  Location: [facility] INVASIVE CV LAB;  Service: Cardiovascular;  Laterality: N/A;   LEFT HEART CATHETERIZATION WITH CORONARY ANGIOGRAM N/A 12/25/2012   Procedure: LEFT HEART CATHETERIZATION WITH CORONARY ANGIOGRAM;  Surgeon: Ozell Fell, MD;  Location: [facility] CATH LAB;  Service: Cardiovascular;  Laterality: N/A;   ROBOTIC ASSISTED TOTAL HYSTERECTOMY WITH BILATERAL SALPINGO OOPHERECTOMY N/A 12/06/2021   Procedure: XI ROBOTIC ASSISTED TOTAL HYSTERECTOMY WITH BILATERAL SALPINGO OOPHORECTOMY;  Surgeon: [name] SAUNDERS, MD;  Location: [facility] ORS;  Service: Gynecology;  Laterality: N/A;   UMBILICAL HERNIA REPAIR  05/04/1989   doesn't think they used mesh    Social History  reports that she quit smoking about 37 years ago. Her smoking use included cigarettes and cigars. She started smoking about 38 years ago. She has a 0.5 pack-year smoking history. She has never used smokeless tobacco. She reports that she does not drink alcohol and does not use drugs.  Allergies  Allergen Reactions   Hydrocodone Shortness Of Breath    I forget to breathe.    Clopidogrel Rash   Clopidogrel Bisulfate Rash     Family History  Problem Relation Age of Onset   Coronary artery disease Mother        in her mid 66s   Hypertension Mother    Cancer Mother        skin   Heart attack Father        in his mid 34s   COPD Father    Stroke Sister    Coronary artery disease Sister    Diabetes Sister    Other Half-Sister    Thyroid  cancer Daughter    Prostate cancer Neg Hx    Colon cancer Neg Hx    Breast cancer Neg Hx    Endometrial cancer Neg Hx    Ovarian cancer Neg Hx    Pancreatic cancer Neg Hx      Prior to Admission medications   Medication Sig Start Date End Date Taking? Authorizing Provider  atorvastatin  (LIPITOR ) 80 MG tablet Take 1 tablet (80 mg total) by mouth every evening. 10/03/21   Newlin, Enobong, MD  buPROPion  (WELLBUTRIN  XL) 150 MG 24 hr tablet Take 150 mg by mouth daily.    [provider]  Cholecalciferol  50 MCG (2000 UT) CAPS 1 capsule Orally Once a day    [provider]  FIASP  FLEXTOUCH 100 UNIT/ML FlexTouch Pen Inject  10 Units into the skin 2 (two) times daily. 12/08/22   [provider]  FLUoxetine  (PROZAC ) 40 MG capsule Take 40 mg by mouth daily. 01/17/23   [provider]  furosemide  (LASIX ) 40 MG tablet Take 1 tablet by mouth once daily 02/05/22   Newlin, Enobong, MD  insulin  degludec (TRESIBA) 100 UNIT/ML FlexTouch Pen Inject 20 Units into the skin daily.    [provider]  losartan  (COZAAR ) 25 MG tablet Take 1 tablet (25 mg total) by mouth daily. 07/19/23   Cooper, Michael, MD  metFORMIN (GLUCOPHAGE-XR) 500 MG 24 hr tablet Take 500 mg by mouth 3 (three) times daily. 07/05/22   [provider]  methocarbamol  (ROBAXIN ) 500 MG tablet Take 500-1,000 mg by mouth 3 (three) times daily as needed. 11/22/23   [provider]  Misc. Devices MISC Portable oxygen  concentrator, 2L oxygen .  Diagnoses-chronic respiratory failure with hypoxia 11/27/21   Delbert Clam, MD  nitroGLYCERIN  (NITROSTAT ) 0.4 MG SL tablet Dissolve 1  tablet under the tongue every 5 minutes as needed for chest pain. Max of 3 doses, then 911. 01/25/23   Lelon Hamilton T, PA-C  polyethylene glycol powder (GLYCOLAX /MIRALAX ) 17 GM/SCOOP powder Take 17 g by mouth daily. 09/17/23   [provider]  prasugrel  (EFFIENT ) 5 MG TABS tablet Take 5 mg by mouth daily.    [provider]  pregabalin  (LYRICA ) 100 MG capsule Take 100 mg by mouth 3 (three) times daily. 10/30/22   [provider]  pregabalin  (LYRICA ) 100 MG capsule Take 1 capsule (100 mg total) by mouth 3 (three) times daily. 02/05/24   Christine Rush, DPM  pregabalin  (LYRICA ) 100 MG capsule Take 1 capsule (100 mg total) by mouth 3 (three) times daily. 02/05/24 05/05/24  Christine Rush, DPM  Respiratory Therapy Supplies (CARETOUCH 2 CPAP HOSE HANGER) MISC daily at night    [provider]  WEGOVY  0.25 MG/0.5ML SOAJ SMARTSIG:0.25 Milligram(s) SUB-Q Once a Week 11/12/23   [provider]    Physical Exam: Vitals:   03/29/24 0655 03/29/24 1107 03/29/24 1318  BP: (!) 114/98 (!) 112/54 133/83  Pulse: 87 84 79  Resp: 18 18   Temp: 98.3 F (36.8 C) 98.6 F (37 C) (!) 97.5 F (36.4 C)  TempSrc: Oral Oral Oral  SpO2: 99% 98% 98%      Constitutional: Middle-age female, moderately built and obese sitting up comfortably in bed without distress. Eyes: PERTLA, lids and conjunctivae normal.  Right conjunctival congestion/edema and redness-per patient and spouse, told to be expected after recent laser treatment. ENMT: Mucous membranes are moist. Posterior pharynx clear of any exudate or lesions. Normal dentition.  Neck: supple, no masses, no thyromegaly Respiratory: Clear to auscultation without wheezing, rhonchi or crackles. No increased work of breathing. Cardiovascular: S1 & S2 heard, regular rate and rhythm. No JVD, murmurs, rubs or clicks. No pedal edema. Abdomen: Non distended. Non tender. Soft. No organomegaly or masses appreciated. No clinical Ascites. Normal  bowel sounds heard. Musculoskeletal: no clubbing / cyanosis. No joint deformity upper and lower extremities. Good ROM, no contractures. Normal muscle tone.  Skin: no rashes, lesions, ulcers. No induration Neurologic: CN 2-12 grossly intact. Sensation intact, DTR normal.  Symmetric 5 x 5 power in all limbs. Psychiatric: Normal judgment and insight. Alert and oriented x 3. Normal mood.     Labs on Admission: I have personally reviewed following labs and imaging studies  CBC: Recent Labs  Lab 03/29/24 0919  WBC 14.3*  NEUTROABS 11.6*  HGB  9.7*  HCT 30.7*  MCV 84.3  PLT 293    Basic Metabolic Panel: Recent Labs  Lab 03/29/24 0919  NA 134*  K 3.8  CL 99  CO2 22  GLUCOSE 220*  BUN 13  CREATININE 0.50  CALCIUM  8.8*    Liver Function Tests: No results for input(s): AST, ALT, ALKPHOS, BILITOT, PROT, ALBUMIN in the last 168 hours.  Urine analysis:    Component Value Date/Time   COLORURINE YELLOW 02/08/2021 1018   APPEARANCEUR HAZY (A) 02/08/2021 1018   LABSPEC 1.006 02/08/2021 1018   PHURINE 5.0 02/08/2021 1018   GLUCOSEU NEGATIVE 02/08/2021 1018   HGBUR SMALL (A) 02/08/2021 1018   BILIRUBINUR NEGATIVE 02/08/2021 1018   BILIRUBINUR neg 08/16/2016 1147   KETONESUR NEGATIVE 02/08/2021 1018   PROTEINUR NEGATIVE 02/08/2021 1018   UROBILINOGEN 2.0 08/16/2016 1147   UROBILINOGEN 2.0 (H) 02/22/2015 2143   NITRITE NEGATIVE 02/08/2021 1018   LEUKOCYTESUR TRACE (A) 02/08/2021 1018     Radiological Exams on Admission: CT ABDOMEN PELVIS W CONTRAST Result Date: 03/29/2024 CLINICAL DATA:  Left pelvic and back pain. Uterine carcinoma. * Tracking Code: BO * EXAM: CT ABDOMEN AND PELVIS WITH CONTRAST TECHNIQUE: Multidetector CT imaging of the abdomen and pelvis was performed using the standard protocol following bolus administration of intravenous contrast. RADIATION DOSE REDUCTION: This exam was performed according to the departmental dose-optimization program which  includes automated exposure control, adjustment of the mA and/or kV according to patient size and/or use of iterative reconstruction technique. CONTRAST:  OMNIPAQUE  IOHEXOL  300 MG/ML  SOLN COMPARISON:  12/08/2021 FINDINGS: Lower Chest: No acute findings. Hepatobiliary: No suspicious hepatic masses identified. Prior cholecystectomy. Stable diffuse biliary ductal dilatation, likely due to post cholecystectomy effect. Pancreas:  No mass or inflammatory changes. Spleen: Within normal limits in size and appearance. Adrenals/Urinary Tract: No suspicious masses identified. No evidence of ureteral calculi or hydronephrosis. Gas seen in urinary bladder, presumably from recent instrumentation. Stomach/Bowel: Tiny hiatal hernia again noted. No evidence of obstruction, inflammatory process or abnormal fluid collections. Vascular/Lymphatic: Large left retroperitoneal mass is seen involving the left psoas muscle and left paraaortic region with mild cortical destruction seen involving the adjacent lumbar vertebral bodies. This is new in measures 7.6 x 7.3 cm, consistent with metastatic disease. Mild lymphadenopathy is also seen elsewhere in the left paraaortic region and left iliac chains, with largest measuring 1.8 cm in the left external iliac chain. No acute vascular findings. Reproductive:  Prior hysterectomy. Other:  Small fat-containing umbilical hernia. Musculoskeletal:  No suspicious bone lesions identified. IMPRESSION: New large left retroperitoneal mass involving the left psoas muscle and left paraaortic region with cortical destruction in the adjacent lumbar spine vertebral bodies, consistent with metastatic disease. Mild left paraaortic and left iliac lymphadenopathy, consistent with metastatic disease. Electronically Signed   By: Norleen DELENA Kil M.D.   On: 03/29/2024 11:37   CT PELVIS WO CONTRAST Result Date: 03/29/2024 CLINICAL DATA:  Left leg and lower back pain. History of uterine cancer. EXAM: CT PELVIS  WITHOUT CONTRAST TECHNIQUE: Multidetector CT imaging of the pelvis was performed following the standard protocol without intravenous contrast. RADIATION DOSE REDUCTION: This exam was performed according to the departmental dose-optimization program which includes automated exposure control, adjustment of the mA and/or kV according to patient size and/or use of iterative reconstruction technique. COMPARISON:  12/08/2021 FINDINGS: Urinary Tract:  Gas is identified in the urinary bladder. Bowel:  Unremarkable visualized pelvic bowel loops. Vascular/Lymphatic: Atherosclerotic calcification noted in the common iliac arteries bilaterally. r 2.9  x 1.4 cm soft tissue nodule is seen along the course of the left external iliac artery and vein (52/6) with adjacent probable 12 mm short axis left pelvic sidewall lymph node on 62/6. Reproductive:  Uterus surgically absent. Other:  No intraperitoneal free fluid. Musculoskeletal: No worrisome lytic or sclerotic osseous abnormality. IMPRESSION: 1. New 2.9 x 1.4 cm soft tissue nodule along the course of the left external iliac artery and vein with adjacent probable 12 mm short axis left pelvic sidewall lymph node. By report, the patient is status post total abdominal hysterectomy with bilateral salpingo oophorectomy on 12/06/2021. As such, this soft tissue nodularity along the left pelvic sidewall is unlikely to represent ovary. In the setting of a uterine cancer diagnosis, metastatic disease is a distinct concern. Dedicated CT abdomen/pelvis with oral and intravenous contrast recommended to further evaluate. 2. Gas in the urinary bladder. In the absence of recent instrumentation, bladder infection would be a primary consideration. 3.  Aortic Atherosclerosis (ICD10-I70.0). Electronically Signed   By: Camellia Candle M.D.   On: 03/29/2024 09:16   CT Lumbar Spine Wo Contrast Result Date: 03/29/2024 EXAM: CT OF THE LUMBAR SPINE WITHOUT CONTRAST 03/29/2024 08:11:43 AM TECHNIQUE: CT of  the lumbar spine was performed without the administration of intravenous contrast. Multiplanar reformatted images are provided for review. Automated exposure control, iterative reconstruction, and/or weight based adjustment of the mA/kV was utilized to reduce the radiation dose to as low as reasonably achievable. COMPARISON: None available. CLINICAL HISTORY: Lumbar radiculopathy, cancer suspected. Per triage notes: Patient coming to ED for evaluation of L leg pain and lower back pain. Reports pain started 2 months ago. Has worsened over the last several days. Was seen by PCP on Thursday and was instructed to follow up with neurologist. Unable to sleep this weekend due to pain. No reports of injuries or new weakness. FINDINGS: BONES AND ALIGNMENT: Mild rightward curvature is present in the lumbar spine. No significant listhesis is present. DEGENERATIVE CHANGES: A leftward disc protrusion and asymmetric facet hypertrophy contribute to moderate left foraminal stenosis at L3-4. A leftward disc protrusion and asymmetric facet hypertrophy contribute to moderate left and mild right foraminal narrowing at L4-5. A leftward disc protrusion is present at L5 to S1. Facet hypertrophy is asymmetric on the right. Mild foraminal narrowing is present bilaterally. SOFT TISSUES: A retroperitoneal left paraspinous soft tissue mass measures 7.5 x 5.5 x 8.5 cm, centered at the L2-3 level. The tumor encroaches on the lateral margin of the left foramen at L2-3. IMPRESSION: 1. Retroperitoneal left paraspinous soft tissue mass measuring 7.5 x 5.5 x 8.5 cm, centered at the L2-3 level, encroaching on the lateral margin of the left foramen at L2-3. This is most concerning for metastatic disease. Primary sarcoma could have a similar appearance. Recommend CT of the chest, abdomen and pelvis with contrast for further evaluation 2. Moderate left foraminal stenosis at L3-4 due to leftward disc protrusion and asymmetric facet hypertrophy. 3.  Moderate left and mild right foraminal narrowing at L4-5 due to leftward disc protrusion and asymmetric facet hypertrophy. 4. Mild bilateral foraminal narrowing at L5-S1 due to leftward disc protrusion and right asymmetric facet hypertrophy. Electronically signed by: Lonni Necessary MD 03/29/2024 08:38 AM EDT RP Workstation: [id number]    Assessment/Plan Principal Problem:   Metastatic cancer [facility]) Active Problems:   Hyperlipidemia associated with type 2 diabetes mellitus (HCC)   Essential hypertension   CAD (coronary artery disease)   OSA treated with BiPAP   Morbid obesity (HCC)  Type 2 diabetes mellitus with complication, with long-term current use of insulin  (HCC)   Anxiety and depression   (HFpEF) heart failure with preserved ejection fraction (HCC)   Chronic respiratory failure (HCC)   Endometrial adenocarcinoma (HCC)   Intractable low back pain     Large left retroperitoneal mass/psoas mass Likely the cause of her left-sided low back pain with radiculopathy, along with left-sided spinal stenosis from disc protrusion. CT A/P with contrast: New large left retroperitoneal mass involving the left psoas muscle and left paraaortic region with cortical destruction in the adjacent lumbar spine vertebral bodies, consistent with metastatic disease.  Detail reports of further imaging as noted above EDP and this MD discussed with Dr. Obadiah, GYN oncologist on-call who recommended imaging guided biopsy of the mass.  They will see patient in formal consultation 7/28. Consulted interventional radiology and discussed with Dr. Luverne and team.  They indicate that patient's Prasugrel  will need to be held for at least a week prior to procedure.  Therefore the plan will be to adequately manage her pain and DC to outpatient evaluation.  Hold Prasugrel  (communicated with on-call cardiologist) Multimodality pain control: Scheduled Tylenol  1 g 3 times daily, Robaxin  500 Mg 3 times daily, as needed  oxycodone  (has tolerated), bowel regimen. PT and OT evaluation  Intractable low back pain with left-sided radiculopathy/fall at home As per discussion above Consider outpatient Neurosurgery consultation and follow-up pertaining to spinal stenosis. PT and OT evaluation.  Normocytic anemia Hemoglobin 14.8 on 04/19/2023, now admitted with hemoglobin of 9.7 in the absence of reported overt bleeding.  Not sure if the 40 mg per DL was her baseline because prior to that in 2023, her hemoglobin were mostly in the 8-10 g range Follow CBC daily.  Check anemia panel.  Check FOBT Outpatient follow-up.  CAD, s/p DES to mid LAD 10/2019 No angina As per cardiology office note from 01/31/2024 there was a preoperative cardiovascular exam, noted to be stable and provider noted that she may hold Effient  for 7 days prior to procedure and then resume Effient  as soon as possible postprocedure.  There is also a telephone note by Dr. Wonda, patient's primary cardiologist on 01/30/2024 that indicates that patient is at low risk of holding Effient  x 7 days.  On-call cardiologist alluded to above to recommendations. Hold Effient   Chronic HFpEF Compensated/stable.  Type II IDDM A1c 7.3 on 11/23/2021.  Requested repeat A1c Resume home dose of maintenance long-acting insulin  and will add SSI.  Awaiting home med rec by pharmacy  Chronic respiratory failure with hypoxia on 2 L/min [place] oxygen  Stable.  Continue  OSA on nightly BiPAP Stable.  Continue  Essential hypertension Controlled.  Continue home meds pending reconciliation by pharmacy.  Hyperlipidemia Continue home meds  Recent acute cystitis Improved with outpatient antibiotics, complete course if it has not been completed  Recent right eye laser treatment Outpatient follow-up with [facility].  Endometrial adenocarcinoma  s/p total hysterectomy, BSO and vaginal brachytherapy completed 03/07/2022 with ongoing surveillance with GYN and radiation  oncology GYN oncology aware of patient's admission and plan to see her in the hospital 7/28.  DVT prophylaxis: Lovenox  Code Status: Full, confirmed with patient in the presence of her spouse at bedside. Family Communication: Spouse was present at bedside throughout the course of patient's evaluation in the ED. Disposition Plan:   Patient is from:  Home  Anticipated DC to: Home  Anticipated DC date: 03/31/2024  Anticipated DC barriers: None   Consults called: Interventional radiology  Admission status: Inpatient, medical bed.  Severity of Illness: The appropriate patient status for this patient is INPATIENT. Inpatient status is judged to be reasonable and necessary in order to provide the required intensity of service to ensure the patient's safety. The patient's presenting symptoms, physical exam findings, and initial radiographic and laboratory data in the context of their chronic comorbidities is felt to place them at high risk for further clinical deterioration. Furthermore, it is not anticipated that the patient will be medically stable for discharge from the hospital within 2 midnights of admission.   * I certify that at the point of admission it is my clinical judgment that the patient will require inpatient hospital care spanning beyond 2 midnights from the point of admission due to high intensity of service, high risk for further deterioration and high frequency of surveillance required.*    Trenda Mar MD FACP, [facility], [facility], [facility]   [facility] [place]    To contact the attending provider between 7A-7P or the covering provider during after hours 7P-7A, please log into the web site [email or web address] and access using universal [place] password for that web site. If you do not have the password, please call the hospital operator.  03/29/2024, 1:41 PM

## 2024-03-29 NOTE — ED Triage Notes (Signed)
 Patient coming to ED for evaluation of L leg pain and lower back pain.  Reports pain started 2 months ago.  Has worsened over the last several days.  Was seen by PCP on Thursday and was instructed to follow up with neurologist.  Unable to sleep this weekend due to pain.  No reports of injuries or new weakness.

## 2024-03-29 NOTE — ED Provider Notes (Signed)
 [place] EMERGENCY DEPARTMENT AT [facility] Provider Note  CSN: [id number] Arrival date & time: 03/29/24 9354  Chief Complaint(s) Back Pain and Leg Pain  HPI Brenda [name] is a 63 y.o. female here today for left-sided back pain with radicular pain down her left leg it has been ongoing for 2 months.  Patient has a past medical history significant for hypertension, chronic respiratory failure on 2 L at baseline, hyperlipidemia, OSA, HFpEF, endometrial carcinoma.  Patient has been following with her primary care doctor, has seen orthopedic surgery.  She was told by Ortho that she has bursitis of her hip, and there is not much to offer her.  She is diabetic, did not want to take steroids.  She has not had any fever or chills.  No urinary symptoms.   Past Medical History Past Medical History:  Diagnosis Date   Anemia    Arthritis    back, hands, hips (02/22/2015)   CAD (coronary artery disease)    a. complex LAD/diagonal bifurcation PCI in 2010. b. STEMI 10/2019 s/p PTCA/DES x1 to mLAD overlapping the old stent, residual disease treated medicaly.   Cancer [facility])    uterine   CHF (congestive heart failure) (HCC)    Depression    Diabetes mellitus (HCC)    started when I was pregnant; not sure if it was type 1 or type 2    History of radiation therapy    Endometrium- HDR 01/22/22-03/07/22- Dr. Lynwood Nasuti   Hypercholesterolemia    Hypertension    Morbid obesity (HCC)    Myocardial infarction (HCC)    mild x 3   Neuromuscular disorder (HCC)    neuropathy feet   Sleep apnea    cpap/   Patient Active Problem List   Diagnosis Date Noted   Metastatic cancer (HCC) 03/29/2024   Small bowel obstruction (HCC) 12/09/2021   SBO (small bowel obstruction) (HCC) 12/08/2021   Endometrial adenocarcinoma (HCC) 12/06/2021   Endometrial cancer (HCC)    Nonspecific chest pain 10/19/2021   Dysfunctional uterine bleeding    Symptomatic anemia    Chronic respiratory failure  (HCC) 03/22/2021   (HFpEF) heart failure with preserved ejection fraction (HCC) 02/09/2021   Old MI (myocardial infarction) 10/26/2019   Anxiety and depression 03/28/2016   Type 2 diabetes mellitus with complication, with long-term current use of insulin  (HCC) 02/23/2015   Morbid obesity (HCC) 02/22/2015   Dyspnea 03/11/2013   Hyperlipidemia associated with type 2 diabetes mellitus (HCC) 05/25/2009   CAD (coronary artery disease) 05/25/2009   Essential hypertension 05/24/2009   OSA treated with BiPAP 05/24/2009   Home Medication(s) Prior to Admission medications   Medication Sig Start Date End Date Taking? Authorizing Provider  atorvastatin  (LIPITOR ) 80 MG tablet Take 1 tablet (80 mg total) by mouth every evening. 10/03/21   Newlin, Enobong, MD  buPROPion  (WELLBUTRIN  XL) 150 MG 24 hr tablet Take 150 mg by mouth daily.    [provider]  Cholecalciferol  50 MCG (2000 UT) CAPS 1 capsule Orally Once a day    [provider]  FIASP  FLEXTOUCH 100 UNIT/ML FlexTouch Pen Inject 10 Units into the skin 2 (two) times daily. 12/08/22   [provider]  FLUoxetine  (PROZAC ) 40 MG capsule Take 40 mg by mouth daily. 01/17/23   [provider]  furosemide  (LASIX ) 40 MG tablet Take 1 tablet by mouth once daily 02/05/22   Newlin, Enobong, MD  insulin  degludec (TRESIBA) 100 UNIT/ML FlexTouch Pen Inject 20 Units into the  skin daily.    [provider]  losartan  (COZAAR ) 25 MG tablet Take 1 tablet (25 mg total) by mouth daily. 07/19/23   Cooper, Michael, MD  metFORMIN (GLUCOPHAGE-XR) 500 MG 24 hr tablet Take 500 mg by mouth 3 (three) times daily. 07/05/22   [provider]  methocarbamol  (ROBAXIN ) 500 MG tablet Take 500-1,000 mg by mouth 3 (three) times daily as needed. 11/22/23   [provider]  Misc. Devices MISC Portable oxygen  concentrator, 2L oxygen .  Diagnoses-chronic respiratory failure with hypoxia 11/27/21   Delbert Clam, MD  nitroGLYCERIN   (NITROSTAT ) 0.4 MG SL tablet Dissolve 1 tablet under the tongue every 5 minutes as needed for chest pain. Max of 3 doses, then 911. 01/25/23   Lelon Hamilton T, PA-C  polyethylene glycol powder (GLYCOLAX /MIRALAX ) 17 GM/SCOOP powder Take 17 g by mouth daily. 09/17/23   [provider]  prasugrel  (EFFIENT ) 5 MG TABS tablet Take 5 mg by mouth daily.    [provider]  pregabalin  (LYRICA ) 100 MG capsule Take 100 mg by mouth 3 (three) times daily. 10/30/22   [provider]  pregabalin  (LYRICA ) 100 MG capsule Take 1 capsule (100 mg total) by mouth 3 (three) times daily. 02/05/24   Christine Rush, DPM  pregabalin  (LYRICA ) 100 MG capsule Take 1 capsule (100 mg total) by mouth 3 (three) times daily. 02/05/24 05/05/24  Christine Rush, DPM  Respiratory Therapy Supplies (CARETOUCH 2 CPAP HOSE HANGER) MISC daily at night    [provider]  WEGOVY  0.25 MG/0.5ML SOAJ SMARTSIG:0.25 Milligram(s) SUB-Q Once a Week 11/12/23   [provider]                                                                                                                                    Past Surgical History Past Surgical History:  Procedure Laterality Date   CARDIAC CATHETERIZATION  12/02/2012   CHOLECYSTECTOMY OPEN  09/04/1987   CORONARY ANGIOPLASTY WITH STENT PLACEMENT  09/03/2009   CORONARY/GRAFT ACUTE MI REVASCULARIZATION N/A 10/26/2019   Procedure: CORONARY/GRAFT ACUTE MI REVASCULARIZATION;  Surgeon: Verlin Lonni BIRCH, MD;  Location: [facility] INVASIVE CV LAB;  Service: Cardiovascular;  Laterality: N/A;   INCISIONAL HERNIA REPAIR N/A 12/09/2021   Procedure: LAPAROSCOPIC REPAIR UMBILICAL HERNIA WITH MESH;  Surgeon: Vernetta Berg, MD;  Location: [facility] ORS;  Service: General;  Laterality: N/A;   LEFT HEART CATH AND CORONARY ANGIOGRAPHY N/A 10/26/2019   Procedure: LEFT HEART CATH AND CORONARY ANGIOGRAPHY;  Surgeon: Verlin Lonni BIRCH, MD;  Location: [facility] INVASIVE CV LAB;  Service: Cardiovascular;   Laterality: N/A;   LEFT HEART CATHETERIZATION WITH CORONARY ANGIOGRAM N/A 12/25/2012   Procedure: LEFT HEART CATHETERIZATION WITH CORONARY ANGIOGRAM;  Surgeon: Ozell Fell, MD;  Location: [facility] CATH LAB;  Service: Cardiovascular;  Laterality: N/A;   ROBOTIC ASSISTED TOTAL HYSTERECTOMY WITH BILATERAL SALPINGO OOPHERECTOMY N/A 12/06/2021   Procedure: XI ROBOTIC ASSISTED TOTAL HYSTERECTOMY WITH BILATERAL SALPINGO OOPHORECTOMY;  Surgeon:  [name] SAUNDERS, MD;  Location: [facility] ORS;  Service: Gynecology;  Laterality: N/A;   UMBILICAL HERNIA REPAIR  05/04/1989   doesn't think they used mesh   Family History Family History  Problem Relation Age of Onset   Coronary artery disease Mother        in her mid 10s   Hypertension Mother    Cancer Mother        skin   Heart attack Father        in his mid 33s   COPD Father    Stroke Sister    Coronary artery disease Sister    Diabetes Sister    Other Half-Sister    Thyroid  cancer Daughter    Prostate cancer Neg Hx    Colon cancer Neg Hx    Breast cancer Neg Hx    Endometrial cancer Neg Hx    Ovarian cancer Neg Hx    Pancreatic cancer Neg Hx     Social History Social History   Tobacco Use   Smoking status: Former    Current packs/day: 0.00    Average packs/day: 0.5 packs/day for 1 year (0.5 ttl pk-yrs)    Types: Cigarettes, Cigars    Start date: 09/03/1985    Quit date: 09/03/1986    Years since quitting: 37.5   Smokeless tobacco: Never  Vaping Use   Vaping status: Never Used  Substance Use Topics   Alcohol use: No   Drug use: No   Allergies Hydrocodone, Clopidogrel, and Clopidogrel bisulfate  Review of Systems Review of Systems  Physical Exam Vital Signs  I have reviewed the triage vital signs BP (!) 112/54 (BP Location: Left Arm)   Pulse 84   Temp 98.6 F (37 C) (Oral)   Resp 18   LMP 10/30/2014 (Approximate)   SpO2 98%   Physical Exam Vitals and nursing note reviewed.  Eyes:     Pupils: Pupils are equal, round, and  reactive to light.  Cardiovascular:     Rate and Rhythm: Normal rate.  Pulmonary:     Effort: Pulmonary effort is normal.  Musculoskeletal:        General: No swelling or deformity. Normal range of motion.     Left lower leg: No edema.  Neurological:     General: No focal deficit present.     Comments: Normal sensation in the bilateral lower extremities.  No saddle anesthesia.  5 5 strength plantar and dorsiflexion.  Patient able to ambulate.     ED Results and Treatments Labs (all labs ordered are listed, but only abnormal results are displayed) Labs Reviewed  BASIC METABOLIC PANEL WITH GFR - Abnormal; Notable for the following components:      Result Value   Sodium 134 (*)    Glucose, Bld 220 (*)    Calcium  8.8 (*)    All other components within normal limits  CBC WITH DIFFERENTIAL/PLATELET - Abnormal; Notable for the following components:   WBC 14.3 (*)    RBC 3.64 (*)    Hemoglobin 9.7 (*)    HCT 30.7 (*)    Neutro Abs 11.6 (*)    All other components within normal limits  Radiology CT ABDOMEN PELVIS W CONTRAST Result Date: 03/29/2024 CLINICAL DATA:  Left pelvic and back pain. Uterine carcinoma. * Tracking Code: BO * EXAM: CT ABDOMEN AND PELVIS WITH CONTRAST TECHNIQUE: Multidetector CT imaging of the abdomen and pelvis was performed using the standard protocol following bolus administration of intravenous contrast. RADIATION DOSE REDUCTION: This exam was performed according to the departmental dose-optimization program which includes automated exposure control, adjustment of the mA and/or kV according to patient size and/or use of iterative reconstruction technique. CONTRAST:  OMNIPAQUE  IOHEXOL  300 MG/ML  SOLN COMPARISON:  12/08/2021 FINDINGS: Lower Chest: No acute findings. Hepatobiliary: No suspicious hepatic masses identified. Prior cholecystectomy.  Stable diffuse biliary ductal dilatation, likely due to post cholecystectomy effect. Pancreas:  No mass or inflammatory changes. Spleen: Within normal limits in size and appearance. Adrenals/Urinary Tract: No suspicious masses identified. No evidence of ureteral calculi or hydronephrosis. Gas seen in urinary bladder, presumably from recent instrumentation. Stomach/Bowel: Tiny hiatal hernia again noted. No evidence of obstruction, inflammatory process or abnormal fluid collections. Vascular/Lymphatic: Large left retroperitoneal mass is seen involving the left psoas muscle and left paraaortic region with mild cortical destruction seen involving the adjacent lumbar vertebral bodies. This is new in measures 7.6 x 7.3 cm, consistent with metastatic disease. Mild lymphadenopathy is also seen elsewhere in the left paraaortic region and left iliac chains, with largest measuring 1.8 cm in the left external iliac chain. No acute vascular findings. Reproductive:  Prior hysterectomy. Other:  Small fat-containing umbilical hernia. Musculoskeletal:  No suspicious bone lesions identified. IMPRESSION: New large left retroperitoneal mass involving the left psoas muscle and left paraaortic region with cortical destruction in the adjacent lumbar spine vertebral bodies, consistent with metastatic disease. Mild left paraaortic and left iliac lymphadenopathy, consistent with metastatic disease. Electronically Signed   By: Norleen DELENA Kil M.D.   On: 03/29/2024 11:37   CT PELVIS WO CONTRAST Result Date: 03/29/2024 CLINICAL DATA:  Left leg and lower back pain. History of uterine cancer. EXAM: CT PELVIS WITHOUT CONTRAST TECHNIQUE: Multidetector CT imaging of the pelvis was performed following the standard protocol without intravenous contrast. RADIATION DOSE REDUCTION: This exam was performed according to the departmental dose-optimization program which includes automated exposure control, adjustment of the mA and/or kV according to patient  size and/or use of iterative reconstruction technique. COMPARISON:  12/08/2021 FINDINGS: Urinary Tract:  Gas is identified in the urinary bladder. Bowel:  Unremarkable visualized pelvic bowel loops. Vascular/Lymphatic: Atherosclerotic calcification noted in the common iliac arteries bilaterally. r 2.9 x 1.4 cm soft tissue nodule is seen along the course of the left external iliac artery and vein (52/6) with adjacent probable 12 mm short axis left pelvic sidewall lymph node on 62/6. Reproductive:  Uterus surgically absent. Other:  No intraperitoneal free fluid. Musculoskeletal: No worrisome lytic or sclerotic osseous abnormality. IMPRESSION: 1. New 2.9 x 1.4 cm soft tissue nodule along the course of the left external iliac artery and vein with adjacent probable 12 mm short axis left pelvic sidewall lymph node. By report, the patient is status post total abdominal hysterectomy with bilateral salpingo oophorectomy on 12/06/2021. As such, this soft tissue nodularity along the left pelvic sidewall is unlikely to represent ovary. In the setting of a uterine cancer diagnosis, metastatic disease is a distinct concern. Dedicated CT abdomen/pelvis with oral and intravenous contrast recommended to further evaluate. 2. Gas in the urinary bladder. In the absence of recent instrumentation, bladder infection would be a primary consideration. 3.  Aortic Atherosclerosis (ICD10-I70.0). Electronically Signed  By: Camellia Candle M.D.   On: 03/29/2024 09:16   CT Lumbar Spine Wo Contrast Result Date: 03/29/2024 EXAM: CT OF THE LUMBAR SPINE WITHOUT CONTRAST 03/29/2024 08:11:43 AM TECHNIQUE: CT of the lumbar spine was performed without the administration of intravenous contrast. Multiplanar reformatted images are provided for review. Automated exposure control, iterative reconstruction, and/or weight based adjustment of the mA/kV was utilized to reduce the radiation dose to as low as reasonably achievable. COMPARISON: None available.  CLINICAL HISTORY: Lumbar radiculopathy, cancer suspected. Per triage notes: Patient coming to ED for evaluation of L leg pain and lower back pain. Reports pain started 2 months ago. Has worsened over the last several days. Was seen by PCP on Thursday and was instructed to follow up with neurologist. Unable to sleep this weekend due to pain. No reports of injuries or new weakness. FINDINGS: BONES AND ALIGNMENT: Mild rightward curvature is present in the lumbar spine. No significant listhesis is present. DEGENERATIVE CHANGES: A leftward disc protrusion and asymmetric facet hypertrophy contribute to moderate left foraminal stenosis at L3-4. A leftward disc protrusion and asymmetric facet hypertrophy contribute to moderate left and mild right foraminal narrowing at L4-5. A leftward disc protrusion is present at L5 to S1. Facet hypertrophy is asymmetric on the right. Mild foraminal narrowing is present bilaterally. SOFT TISSUES: A retroperitoneal left paraspinous soft tissue mass measures 7.5 x 5.5 x 8.5 cm, centered at the L2-3 level. The tumor encroaches on the lateral margin of the left foramen at L2-3. IMPRESSION: 1. Retroperitoneal left paraspinous soft tissue mass measuring 7.5 x 5.5 x 8.5 cm, centered at the L2-3 level, encroaching on the lateral margin of the left foramen at L2-3. This is most concerning for metastatic disease. Primary sarcoma could have a similar appearance. Recommend CT of the chest, abdomen and pelvis with contrast for further evaluation 2. Moderate left foraminal stenosis at L3-4 due to leftward disc protrusion and asymmetric facet hypertrophy. 3. Moderate left and mild right foraminal narrowing at L4-5 due to leftward disc protrusion and asymmetric facet hypertrophy. 4. Mild bilateral foraminal narrowing at L5-S1 due to leftward disc protrusion and right asymmetric facet hypertrophy. Electronically signed by: Lonni Necessary MD 03/29/2024 08:38 AM EDT RP Workstation: [id number]     Pertinent labs & imaging results that were available during my care of the patient were reviewed by me and considered in my medical decision making (see MDM for details).  Medications Ordered in ED Medications  ketorolac  (TORADOL ) 15 MG/ML injection 15 mg (has no administration in time range)  acetaminophen  (TYLENOL ) tablet 1,000 mg (1,000 mg Oral Given 03/29/24 0819)  oxyCODONE  (Oxy IR/ROXICODONE ) immediate release tablet 5 mg (5 mg Oral Given 03/29/24 0819)  ibuprofen  (ADVIL ) tablet 400 mg (400 mg Oral Given 03/29/24 0819)  HYDROmorphone  (DILAUDID ) injection 0.5 mg (0.5 mg Intravenous Given 03/29/24 0909)  iohexol  (OMNIPAQUE ) 300 MG/ML solution 100 mL (100 mLs Intravenous Contrast Given 03/29/24 1037)  Procedures Procedures  (including critical care time)  Medical Decision Making / ED Course   This patient presents to the ED for concern of back pain, this involves an extensive number of treatment options, and is a complaint that carries with it a high risk of complications and morbidity.  The differential diagnosis includes musculoskeletal back pain, degenerative disc disease, radiculopathy, malignancy, metastatic pain.  MDM: Patient's pain may be musculoskeletal pain, however with her history of endometrial adenocarcinoma could have concern for malignancy as well.  Will obtain imaging of the patient's L-spine and pelvis.  Reassessment 8:50 AM-patient's CT imaging of the L-spine shows retroperitoneal mass, likely causing some of her symptoms.  Will obtain contrasted dedicated films of the abdomen and pelvis.  Patient requiring more pain medication.  Reassessment 12:15 PM-patient with a retroperitoneal mass on CT abdomen pelvis.  Spoke with Dr. Obadiah from gynecology oncology, who agreed with admission for symptom control, patient likely will need biopsy to  determine exact nature of mass.  Patient continues to have no neurological deficits aside from radiculopathy.  Will admit.   Additional history obtained: -Additional history obtained from son at bedside -External records from outside source obtained and reviewed including: Chart review including previous notes, labs, imaging, consultation notes   Lab Tests: -I ordered, reviewed, and interpreted labs.   The pertinent results include:   Labs Reviewed  BASIC METABOLIC PANEL WITH GFR - Abnormal; Notable for the following components:      Result Value   Sodium 134 (*)    Glucose, Bld 220 (*)    Calcium  8.8 (*)    All other components within normal limits  CBC WITH DIFFERENTIAL/PLATELET - Abnormal; Notable for the following components:   WBC 14.3 (*)    RBC 3.64 (*)    Hemoglobin 9.7 (*)    HCT 30.7 (*)    Neutro Abs 11.6 (*)    All other components within normal limits      Imaging Studies ordered: I ordered imaging studies including CT imaging of the abdomen, L-spine I independently visualized and interpreted imaging. I agree with the radiologist interpretation   Medicines ordered and prescription drug management: Meds ordered this encounter  Medications   acetaminophen  (TYLENOL ) tablet 1,000 mg   oxyCODONE  (Oxy IR/ROXICODONE ) immediate release tablet 5 mg    Refill:  0   ibuprofen  (ADVIL ) tablet 400 mg   HYDROmorphone  (DILAUDID ) injection 0.5 mg   iohexol  (OMNIPAQUE ) 300 MG/ML solution 100 mL   ketorolac  (TORADOL ) 15 MG/ML injection 15 mg    -I have reviewed the patients home medicines and have made adjustments as needed  Cardiac Monitoring: The patient was maintained on a cardiac monitor.  I personally viewed and interpreted the cardiac monitored which showed an underlying rhythm of: Normal sinus rhythm  Social Determinants of Health:  Factors impacting patients care include: Multiple medical comorbidities including COPD.   Reevaluation: After the interventions  noted above, I reevaluated the patient and found that they have :improved  Co morbidities that complicate the patient evaluation  Past Medical History:  Diagnosis Date   Anemia    Arthritis    back, hands, hips (02/22/2015)   CAD (coronary artery disease)    a. complex LAD/diagonal bifurcation PCI in 2010. b. STEMI 10/2019 s/p PTCA/DES x1 to mLAD overlapping the old stent, residual disease treated medicaly.   Cancer [facility])    uterine   CHF (congestive heart failure) (HCC)    Depression    Diabetes mellitus (HCC)  started when I was pregnant; not sure if it was type 1 or type 2    History of radiation therapy    Endometrium- HDR 01/22/22-03/07/22- Dr. 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

## 2024-03-29 NOTE — Plan of Care (Signed)
   Problem: Education: Goal: Knowledge of General Education information will improve Description Including pain rating scale, medication(s)/side effects and non-pharmacologic comfort measures Outcome: Progressing

## 2024-03-30 DIAGNOSIS — C7951 Secondary malignant neoplasm of bone: Secondary | ICD-10-CM | POA: Diagnosis not present

## 2024-03-30 DIAGNOSIS — G893 Neoplasm related pain (acute) (chronic): Secondary | ICD-10-CM | POA: Diagnosis not present

## 2024-03-30 DIAGNOSIS — C786 Secondary malignant neoplasm of retroperitoneum and peritoneum: Secondary | ICD-10-CM | POA: Diagnosis not present

## 2024-03-30 LAB — BASIC METABOLIC PANEL WITH GFR
Anion gap: 11 (ref 5–15)
Anion gap: 9 (ref 5–15)
BUN: 13 mg/dL (ref 6–20)
BUN: 13 mg/dL (ref 6–20)
CO2: 25 mmol/L (ref 22–32)
CO2: 27 mmol/L (ref 22–32)
Calcium: 9.1 mg/dL (ref 8.9–10.3)
Calcium: 8.7 mg/dL — ABNORMAL LOW (ref 8.9–10.3)
Chloride: 101 mmol/L (ref 98–111)
Chloride: 97 mmol/L — ABNORMAL LOW (ref 98–111)
Creatinine, Ser: 0.77 mg/dL (ref 0.44–1.00)
Creatinine, Ser: 0.77 mg/dL (ref 0.44–1.00)
GFR, Estimated: >60 mL/min (ref >60)
GFR, Estimated: >60 mL/min (ref >60)
Glucose, Bld: 203 mg/dL — ABNORMAL HIGH (ref 70–99)
Glucose, Bld: 314 mg/dL — ABNORMAL HIGH (ref 70–99)
Potassium: 5.2 mmol/L — ABNORMAL HIGH (ref 3.5–5.1)
Potassium: 3.7 mmol/L (ref 3.5–5.1)
Sodium: 137 mmol/L (ref 135–145)
Sodium: 133 mmol/L — ABNORMAL LOW (ref 135–145)

## 2024-03-30 LAB — CBC
HCT: 31 % — ABNORMAL LOW (ref 36.0–46.0)
Hemoglobin: 9.7 g/dL — ABNORMAL LOW (ref 12.0–15.0)
MCH: 27.5 pg (ref 26.0–34.0)
MCHC: 31.3 g/dL (ref 30.0–36.0)
MCV: 87.8 fL (ref 80.0–100.0)
Platelets: 285 K/uL (ref 150–400)
RBC: 3.53 MIL/uL — ABNORMAL LOW (ref 3.87–5.11)
RDW: 13.6 % (ref 11.5–15.5)
WBC: 12.4 K/uL — ABNORMAL HIGH (ref 4.0–10.5)
nRBC: 0 % (ref 0.0–0.2)

## 2024-03-30 LAB — GLUCOSE, CAPILLARY
Glucose-Capillary: 199 mg/dL — ABNORMAL HIGH (ref 70–99)
Glucose-Capillary: 228 mg/dL — ABNORMAL HIGH (ref 70–99)

## 2024-03-30 MED ORDER — LOSARTAN POTASSIUM 50 MG PO TABS: 25.0000 mg | ORAL_TABLET | Freq: Every day | ORAL | Status: DC

## 2024-03-30 MED ORDER — SODIUM ZIRCONIUM CYCLOSILICATE 10 G PO PACK
10.0000 g | PACK | Freq: Once | ORAL | Status: AC
  Administered 2024-03-30: 10 g via ORAL
  Filled 2024-03-30: qty 1

## 2024-03-30 MED ORDER — METHOCARBAMOL 500 MG PO TABS: 500.0000 mg | ORAL_TABLET | Freq: Three times a day (TID) | ORAL | 0 refills | Status: DC | PRN

## 2024-03-30 MED ORDER — ACETAMINOPHEN 500 MG PO TABS: 500.0000 mg | ORAL_TABLET | Freq: Three times a day (TID) | ORAL | Status: AC | PRN

## 2024-03-30 MED ORDER — PRASUGREL HCL 5 MG PO TABS: 5.0000 mg | ORAL_TABLET | Freq: Every morning | ORAL | Status: DC

## 2024-03-30 MED ORDER — OXYCODONE HCL 5 MG PO TABS: 5.0000 mg | ORAL_TABLET | Freq: Three times a day (TID) | ORAL | 0 refills | Status: DC | PRN

## 2024-03-30 NOTE — Discharge Summary (Addendum)
 Physician Discharge Summary  Brenda [name] FMW:[id number] DOB: [birth year]  PCP: Brenda Eleanor PARAS, PA-C  Admitted from: Home Discharged to: Home  Admit date: 03/29/2024 Discharge date: 03/30/2024  Recommendations for Outpatient Follow-up:    Follow-up Information     Brenda Comer SAUNDERS, MD Follow up.   Specialty: Gynecologic Oncology Why: You will receive a phone call from GYN Oncology to discuss follow up plan. We will also set up a CT scan of the chest. We will also see about moving up your IR biopsy. Contact information: [address] [place] [address] [phone]         Brenda Eleanor PARAS, PA-C. Schedule an appointment as soon as possible for a visit in 1 week(s).   Specialty: Physician Assistant Why: To be seen with repeat labs (CBC & BMP), Hospital Follow Up. Contact information: Brenda [name] [place] [address] [phone]                  Home Health: None    Equipment/Devices: None    Discharge Condition: Improved and stable.   Code Status: Full Code Diet recommendation:  Discharge Diet Orders (From admission, onward)     Start     Ordered   03/30/24 0000  Diet - low sodium heart healthy        03/30/24 1419   03/30/24 0000  Diet Carb Modified        03/30/24 1419             Discharge Diagnoses:  Principal Problem:   Metastatic cancer (HCC) Active Problems:   Hyperlipidemia associated with type 2 diabetes mellitus (HCC)   Essential hypertension   CAD (coronary artery disease)   OSA treated with BiPAP   Morbid obesity (HCC)   Type 2 diabetes mellitus with complication, with long-term current use of insulin  (HCC)   Anxiety and depression   (HFpEF) heart failure with preserved ejection fraction (HCC)   Chronic respiratory failure (HCC)   Endometrial adenocarcinoma (HCC)   Intractable low back pain   Brief Summary: 63 year old married female, independent, medical history significant for endometrial  adenocarcinoma s/p total hysterectomy, BSO and vaginal brachytherapy completed 03/07/2022 with ongoing surveillance with GYN and radiation oncology, CAD, NSTEMI, s/p DES to mid LAD 10/2019, chronic HFpEF, chronic respiratory failure on home oxygen  2 L/min continuously, OSA on nightly BiPAP, type II DM, HTN, HLD, morbid obesity, recent treatment for UTI, recent laser treatment right eye, presented to ED with subacute on chronic intractable left-sided low back pain.  Patient reports approximately 4-month history of left-sided low back pain.  Seen by orthopedics in May 2025 and told to have bursitis but avoided steroid injections due to history of diabetes.  Since then, reports progressively worsening low back pain, more so in the last week or so, almost constant, severe, intermittent sharp shooting pains to left anterior thigh up to the knee.  Has chronic lower extremity numbness from diabetic peripheral neuropathy which has not changed.  Reports some weakness in the left leg.  No sphincter issues, has chronic diarrhea.  States that she saw her PCP multiple times, most recently on 7/24 and she was going to be referred to neurology and pain management for further evaluation and management.  At that visit, patient also reported urinary urgency, frequency and dysuria and was started on antibiotics for UTI.  After returning home from that visit, she states that her leg gave away and she fell without sustaining injuries.  No syncope reported.  ED Course: Afebrile with stable vital signs.   Lab work significant for serum sodium 134 not corrected to blood glucose of 220, WBC 14.3, hemoglobin 9.7.   CT A/P with contrast: New large left retroperitoneal mass involving the left psoas muscle and left paraaortic region with cortical destruction in the adjacent lumbar spine vertebral bodies, consistent with metastatic disease. Mild left paraaortic and left iliac lymphadenopathy, consistent with metastatic disease.   CT lumbar  spine without contrast and CT pelvis without contrast: IMPRESSION: Retroperitoneal left paraspinous soft tissue mass measuring 7.5 x 5.5 x 8.5 cm, centered at the L2-3 level, encroaching on the lateral margin of the left foramen at L2-3. This is most concerning for metastatic disease. Primary sarcoma could have a similar appearance. Recommend CT of the chest, abdomen and pelvis with contrast for further evaluation    In ED, received a dose of IV Dilaudid  0.5 Mg, Advil  400 Mg, Toradol  15 Mg and oxycodone  5 Mg with improvement in pain.   Assessment/Plan  Large left retroperitoneal mass/psoas mass Concern for metastatic disease.  Likely the cause of her left-sided low back pain with radiculopathy, along with left-sided spinal stenosis from disc protrusion. CT A/P with contrast: New large left retroperitoneal mass involving the left psoas muscle and left paraaortic region with cortical destruction in the adjacent lumbar spine vertebral bodies, consistent with metastatic disease.  Detail reports of further imaging as noted above EDP and this MD discussed with Dr. Obadiah, GYN oncologist on-call who recommended imaging guided biopsy of the mass.   Consulted interventional radiology and discussed with Dr. Luverne and team on 7/27.  They indicate that patient's Prasugrel  will need to be held for at least a week prior to procedure.  Communicated extensively with the IR team today and the earliest that they will be able to do the image guided biopsy will be on 8/13 at [facility].  Therefore patient will continue her Prasugrel /Effient  until 8/5 and then hold it for a week thereafter for the biopsy on 8/13.  Postbiopsy, radiology team to advise patient as to when she can restart taking it. Patient's pain is well-controlled on minimal pain meds.  Apart from meds that she received in the ED yesterday morning, she received 2 doses of Oxy IR today which she has tolerated without side effects and pain is  controlled. Despite her intolerance to hydrocodone, which was discussed with patient in detail, she seems to have tolerated the oxycodone  without any issues and wishes to continue this as needed for severe pain. Continue as needed Tylenol  for mild to moderate pain and as needed Robaxin  for muscle spasms. PT and OT evaluated patient, she ambulated 150 feet with them and they do not have any recommendations for discharge I discussed in detail with Dr. Eldonna, GYN oncologist on-call who have seen her and agree with discharge home.  They will arrange for outpatient CT chest and will also attempt to try and get the CT-guided biopsy appointment preponed and are aware that the Prasugrel /Effient  will have to be held a week prior to discharge.   Intractable low back pain with left-sided radiculopathy/fall at home As per discussion above Consider outpatient Neurosurgery consultation and follow-up pertaining to spinal stenosis.   Normocytic anemia Hemoglobin 14.8 on 04/19/2023, now admitted with hemoglobin of 9.7 in the absence of reported overt bleeding.  Not sure if the 14 mg per DL was her baseline because prior to that in 2023, her hemoglobin were mostly in the 8-10 g range Hemoglobin  stable at 9.7.  Anemia panel results appreciated, suspect anemia of chronic disease Iron 20, TIBC 224, saturation ratio 9, ferritin 157, serum folate 10.2 and vitamin B12: 487. Outpatient follow-up.   CAD, s/p DES to mid LAD 10/2019 No angina As per cardiology office note from 01/31/2024 there was a preoperative cardiovascular exam, noted to be stable and provider noted that she may hold Effient  for 7 days prior to procedure and then resume Effient  as soon as possible postprocedure.  There is also a telephone note by Dr. Wonda, patient's primary Cardiologist on 01/30/2024 that indicates that patient is at low risk of holding Effient  x 7 days. Communicated with on-call Cardiologist on 7/27 and they referred to the same  recommendations. Prasugrel /Effient  hold as discussed elsewhere.   Chronic HFpEF Compensated/stable.  Continue home dose of Lasix  and ARB.  Mild hyperkalemia Serum potassium 5.2.  No mention of hemolysis.  Got a dose of Lokelma  10 g x 1 this morning.  Repeat BMP, potassium down to 3.7. Follow BMP as outpatient.   Type II IDDM A1c 7.3 on 11/23/2021.  Repeat A1c 7/27: 9.2 indicates poor outpatient control. Continue PTA insulins and metformin with close outpatient follow-up with PCP regarding further management.   Chronic respiratory failure with hypoxia on 2 L/min [place] oxygen  Stable.  Continue   OSA on nightly BiPAP Stable.  Continue   Essential hypertension Controlled.  Continue home meds.   Hyperlipidemia Continue home meds   Recent acute cystitis Improved with outpatient antibiotics, complete Cipro  course that she was placed on PTA.   Recent right eye laser treatment Outpatient follow-up with [facility].   Endometrial adenocarcinoma  s/p total hysterectomy, BSO and vaginal brachytherapy completed 03/07/2022 with ongoing surveillance with GYN and radiation oncology GYN oncology consultation 7/28 appreciated.  Discussed with Dr. Eldonna.  Consultations: Interventional radiology GYN oncology  Procedures: None   Discharge Instructions  Discharge Instructions     Call MD for:  severe uncontrolled pain   Complete by: As directed    Diet - low sodium heart healthy   Complete by: As directed    Diet Carb Modified   Complete by: As directed    Increase activity slowly   Complete by: As directed         Medication List     PAUSE taking these medications    metFORMIN 500 MG 24 hr tablet Wait to take this until: April 01, 2024 Commonly known as: GLUCOPHAGE-XR Take 1,000 mg by mouth in the morning and at bedtime.       STOP taking these medications    cyclobenzaprine 10 MG tablet Commonly known as: FLEXERIL       TAKE these medications     acetaminophen  500 MG tablet Commonly known as: TYLENOL  Take 1-2 tablets (500-1,000 mg total) by mouth every 8 (eight) hours as needed for mild pain (pain score 1-3) or moderate pain (pain score 4-6).   atorvastatin  80 MG tablet Commonly known as: LIPITOR  Take 1 tablet (80 mg total) by mouth every evening. What changed: when to take this   buPROPion  150 MG 24 hr tablet Commonly known as: WELLBUTRIN  XL Take 150 mg by mouth in the morning.   CareTouch 2 CPAP Hose [employer] Misc daily at night   ciprofloxacin  500 MG tablet Commonly known as: CIPRO  Take 500 mg by mouth 2 (two) times daily with a meal.   Fiasp  FlexTouch 100 UNIT/ML FlexTouch Pen Generic drug: insulin  aspart Inject 5-8 Units into the skin See  admin instructions. Inject 5 units into the skin before breakfast & lunch and 8 units before supper/evening meal   FLUoxetine  40 MG capsule Commonly known as: PROZAC  Take 40 mg by mouth daily.   FreeStyle Libre 3 Sensor Misc Inject 1 Device into the skin every 14 (fourteen) days.   furosemide  40 MG tablet Commonly known as: LASIX  Take 1 tablet by mouth once daily What changed: when to take this   insulin  degludec 100 UNIT/ML FlexTouch Pen Commonly known as: TRESIBA Inject 20 Units into the skin every evening.   losartan  25 MG tablet Commonly known as: COZAAR  Take 1 tablet (25 mg total) by mouth daily.   methocarbamol  500 MG tablet Commonly known as: ROBAXIN  Take 1 tablet (500 mg total) by mouth every 8 (eight) hours as needed for muscle spasms.   Misc. Devices Misc Portable oxygen  concentrator, 2L oxygen .  Diagnoses-chronic respiratory failure with hypoxia   nitroGLYCERIN  0.4 MG SL tablet Commonly known as: NITROSTAT  Dissolve 1 tablet under the tongue every 5 minutes as needed for chest pain. Max of 3 doses, then 911. What changed:  how much to take how to take this when to take this reasons to take this additional instructions   oxyCODONE  5 MG immediate  release tablet Commonly known as: Oxy IR/ROXICODONE  Take 1 tablet (5 mg total) by mouth every 8 (eight) hours as needed for severe pain (pain score 7-10).   OXYGEN  Inhale 2 L/min into the lungs continuous.   polyethylene glycol powder 17 GM/SCOOP powder Commonly known as: GLYCOLAX /MIRALAX  Take 17 g by mouth daily as needed for mild constipation.   prasugrel  5 MG Tabs tablet Commonly known as: Effient  Take 1 tablet (5 mg total) by mouth in the morning. AFTER TAKING YOUR DOSE ON 04/07/2024, STOP TAKING IT IN PREPARATION FOR THE BIOPSY ON 04/15/2024. AFTER THE BIOPSY, FOLLOW THE RADIOLOGIST RECOMMENDATIONS AS TO TIMING OF RESTARTING THIS MEDICINE. What changed: additional instructions   prednisoLONE  acetate 1 % ophthalmic suspension Commonly known as: PRED FORTE  Place 1 drop into the left eye See admin instructions. INSTILL 1 DROP INTO LEFT EYE 4 TIMES DAILY FOR 1 WEEK, 3 TIMES DAILY FOR 1 WEEK, TWICE DAILY FOR 1 WEEK, ONCE DAILY FOR 1 WEEK, THEN STOP   pregabalin  100 MG capsule Commonly known as: Lyrica  Take 1 capsule (100 mg total) by mouth 3 (three) times daily. What changed: when to take this   Vitamin D3 Super Strength 50 MCG (2000 UT) Caps Generic drug: Cholecalciferol  Take 2,000 Units by mouth in the morning.   Wegovy  0.25 MG/0.5ML Soaj Generic drug: Semaglutide -Weight Management Inject 0.25 mg into the skin every Wednesday.       Allergies  Allergen Reactions   Hydrocodone Shortness Of Breath, Dermatitis and Other (See Comments)    I forget to breathe.   Clopidogrel Rash and Dermatitis      Procedures/Studies: CT ABDOMEN PELVIS W CONTRAST Result Date: 03/29/2024 CLINICAL DATA:  Left pelvic and back pain. Uterine carcinoma. * Tracking Code: BO * EXAM: CT ABDOMEN AND PELVIS WITH CONTRAST TECHNIQUE: Multidetector CT imaging of the abdomen and pelvis was performed using the standard protocol following bolus administration of intravenous contrast. RADIATION DOSE  REDUCTION: This exam was performed according to the departmental dose-optimization program which includes automated exposure control, adjustment of the mA and/or kV according to patient size and/or use of iterative reconstruction technique. CONTRAST:  OMNIPAQUE  IOHEXOL  300 MG/ML  SOLN COMPARISON:  12/08/2021 FINDINGS: Lower Chest: No acute findings. Hepatobiliary: No suspicious hepatic  masses identified. Prior cholecystectomy. Stable diffuse biliary ductal dilatation, likely due to post cholecystectomy effect. Pancreas:  No mass or inflammatory changes. Spleen: Within normal limits in size and appearance. Adrenals/Urinary Tract: No suspicious masses identified. No evidence of ureteral calculi or hydronephrosis. Gas seen in urinary bladder, presumably from recent instrumentation. Stomach/Bowel: Tiny hiatal hernia again noted. No evidence of obstruction, inflammatory process or abnormal fluid collections. Vascular/Lymphatic: Large left retroperitoneal mass is seen involving the left psoas muscle and left paraaortic region with mild cortical destruction seen involving the adjacent lumbar vertebral bodies. This is new in measures 7.6 x 7.3 cm, consistent with metastatic disease. Mild lymphadenopathy is also seen elsewhere in the left paraaortic region and left iliac chains, with largest measuring 1.8 cm in the left external iliac chain. No acute vascular findings. Reproductive:  Prior hysterectomy. Other:  Small fat-containing umbilical hernia. Musculoskeletal:  No suspicious bone lesions identified. IMPRESSION: New large left retroperitoneal mass involving the left psoas muscle and left paraaortic region with cortical destruction in the adjacent lumbar spine vertebral bodies, consistent with metastatic disease. Mild left paraaortic and left iliac lymphadenopathy, consistent with metastatic disease. Electronically Signed   By: Norleen DELENA Kil M.D.   On: 03/29/2024 11:37   CT PELVIS WO CONTRAST Result Date:  03/29/2024 CLINICAL DATA:  Left leg and lower back pain. History of uterine cancer. EXAM: CT PELVIS WITHOUT CONTRAST TECHNIQUE: Multidetector CT imaging of the pelvis was performed following the standard protocol without intravenous contrast. RADIATION DOSE REDUCTION: This exam was performed according to the departmental dose-optimization program which includes automated exposure control, adjustment of the mA and/or kV according to patient size and/or use of iterative reconstruction technique. COMPARISON:  12/08/2021 FINDINGS: Urinary Tract:  Gas is identified in the urinary bladder. Bowel:  Unremarkable visualized pelvic bowel loops. Vascular/Lymphatic: Atherosclerotic calcification noted in the common iliac arteries bilaterally. r 2.9 x 1.4 cm soft tissue nodule is seen along the course of the left external iliac artery and vein (52/6) with adjacent probable 12 mm short axis left pelvic sidewall lymph node on 62/6. Reproductive:  Uterus surgically absent. Other:  No intraperitoneal free fluid. Musculoskeletal: No worrisome lytic or sclerotic osseous abnormality. IMPRESSION: 1. New 2.9 x 1.4 cm soft tissue nodule along the course of the left external iliac artery and vein with adjacent probable 12 mm short axis left pelvic sidewall lymph node. By report, the patient is status post total abdominal hysterectomy with bilateral salpingo oophorectomy on 12/06/2021. As such, this soft tissue nodularity along the left pelvic sidewall is unlikely to represent ovary. In the setting of a uterine cancer diagnosis, metastatic disease is a distinct concern. Dedicated CT abdomen/pelvis with oral and intravenous contrast recommended to further evaluate. 2. Gas in the urinary bladder. In the absence of recent instrumentation, bladder infection would be a primary consideration. 3.  Aortic Atherosclerosis (ICD10-I70.0). Electronically Signed   By: Camellia Candle M.D.   On: 03/29/2024 09:16   CT Lumbar Spine Wo Contrast Result  Date: 03/29/2024 EXAM: CT OF THE LUMBAR SPINE WITHOUT CONTRAST 03/29/2024 08:11:43 AM TECHNIQUE: CT of the lumbar spine was performed without the administration of intravenous contrast. Multiplanar reformatted images are provided for review. Automated exposure control, iterative reconstruction, and/or weight based adjustment of the mA/kV was utilized to reduce the radiation dose to as low as reasonably achievable. COMPARISON: None available. CLINICAL HISTORY: Lumbar radiculopathy, cancer suspected. Per triage notes: Patient coming to ED for evaluation of L leg pain and lower back pain. Reports pain started 2  months ago. Has worsened over the last several days. Was seen by PCP on Thursday and was instructed to follow up with neurologist. Unable to sleep this weekend due to pain. No reports of injuries or new weakness. FINDINGS: BONES AND ALIGNMENT: Mild rightward curvature is present in the lumbar spine. No significant listhesis is present. DEGENERATIVE CHANGES: A leftward disc protrusion and asymmetric facet hypertrophy contribute to moderate left foraminal stenosis at L3-4. A leftward disc protrusion and asymmetric facet hypertrophy contribute to moderate left and mild right foraminal narrowing at L4-5. A leftward disc protrusion is present at L5 to S1. Facet hypertrophy is asymmetric on the right. Mild foraminal narrowing is present bilaterally. SOFT TISSUES: A retroperitoneal left paraspinous soft tissue mass measures 7.5 x 5.5 x 8.5 cm, centered at the L2-3 level. The tumor encroaches on the lateral margin of the left foramen at L2-3. IMPRESSION: 1. Retroperitoneal left paraspinous soft tissue mass measuring 7.5 x 5.5 x 8.5 cm, centered at the L2-3 level, encroaching on the lateral margin of the left foramen at L2-3. This is most concerning for metastatic disease. Primary sarcoma could have a similar appearance. Recommend CT of the chest, abdomen and pelvis with contrast for further evaluation 2. Moderate left  foraminal stenosis at L3-4 due to leftward disc protrusion and asymmetric facet hypertrophy. 3. Moderate left and mild right foraminal narrowing at L4-5 due to leftward disc protrusion and asymmetric facet hypertrophy. 4. Mild bilateral foraminal narrowing at L5-S1 due to leftward disc protrusion and right asymmetric facet hypertrophy. Electronically signed by: Lonni Necessary MD 03/29/2024 08:38 AM EDT RP Workstation: [id number]      Subjective: Patient feels much better.  Pain is controlled.  Ambulated with PT and OT.  No other complaints.  Discharge Exam:  Vitals:   03/30/24 0453 03/30/24 0600 03/30/24 0923 03/30/24 1414  BP: (!) 116/48   (!) 103/57  Pulse: 82  90 78  Resp: 16   16  Temp: 98 F (36.7 C)   97.7 F (36.5 C)  TempSrc: Oral   Oral  SpO2: 99% 100% 98% 100%  Weight:      Height:        General: Pleasant middle-aged female, moderately built and obese lying comfortably propped up in bed without distress.  Oral mucosa moist. Cardiovascular: S1 & S2 heard, RRR, S1/S2 +. No murmurs, rubs, gallops or clicks. No JVD or pedal edema. Respiratory: Clear to auscultation without wheezing, rhonchi or crackles. No increased work of breathing. Abdominal:  Non distended, non tender & soft. No organomegaly or masses appreciated. Normal bowel sounds heard. CNS: Alert and oriented. No focal deficits. Extremities: no edema, no cyanosis    The results of significant diagnostics from this hospitalization (including imaging, microbiology, ancillary and laboratory) are listed below for reference.     Microbiology: No results found for this or any previous visit (from the past 240 hours).   Labs: CBC: Recent Labs  Lab 03/29/24 0919 03/30/24 0531  WBC 14.3* 12.4*  NEUTROABS 11.6*  --   HGB 9.7* 9.7*  HCT 30.7* 31.0*  MCV 84.3 87.8  PLT 293 285    Basic Metabolic Panel: Recent Labs  Lab 03/29/24 0919 03/30/24 0531 03/30/24 1421  NA 134* 137 133*  K 3.8 5.2* 3.7  CL  99 101 97*  CO2 22 25 27   GLUCOSE 220* 203* 314*  BUN 13 13 13   CREATININE 0.50 0.77 0.77  CALCIUM  8.8* 9.1 8.7*    Liver Function Tests: Recent Labs  Lab  03/29/24 1512  AST 12*  ALT 10  ALKPHOS 80  BILITOT 0.9  PROT 6.4*  ALBUMIN 2.6*    CBG: Recent Labs  Lab 03/29/24 1626 03/29/24 2138 03/29/24 2141 03/30/24 0732 03/30/24 1210  GLUCAP 378* 316* 321* 199* 228*    Hgb A1c Recent Labs    03/29/24 1512  HGBA1C 9.2*    Anemia work up Recent Labs    03/29/24 1512  VITAMINB12 487  FOLATE 10.2  FERRITIN 157  TIBC 224*  IRON 20*  RETICCTPCT 1.7    Discussed in detail with patient's spouse at bedside.  Time coordinating discharge: 35 minutes  SIGNED:  Trenda Mar, MD,  FACP, [facility], [facility], [facility]   [facility] [place]     To contact the attending provider between 7A-7P or the covering provider during after hours 7P-7A, please log into the web site [email or web address] and access using universal [place] password for that web site. If you do not have the password, please call the hospital operator.

## 2024-03-30 NOTE — Consult Note (Signed)
 Gynecologic Oncology Consultation  Brenda [name] 63 y.o. female  CC:  Chief Complaint  Patient presents with   Back Pain   Leg Pain    HPI: Brenda [name] is a 63 year old female with a history of endometrial cancer who presented to the [facility] ER on 03/29/24 with symptoms of left leg pain, back pain that has been present for the past 2 months. She had been evaluated by ortho prior who told her it was bursitis of the hip with limited recommendations (steroid injection avoided due to diabetes hx). Labs in the ER included Bmet, CBC (WBC 14.3/ Hgb 9.7/ Hct 30.7), LFTs with albumin at 2.6, A1C 9.2. CT imaging was obtained including CT spine, CT pelvis without contrast, followed by CT AP with contrast. Findings below: CT spine: 1. Retroperitoneal left paraspinous soft tissue mass measuring 7.5 x 5.5 x 8.5 cm, centered at the L2-3 level, encroaching on the lateral margin of the left foramen at L2-3. This is most concerning for metastatic disease. Primary sarcoma could have a similar appearance. Recommend CT of the chest, abdomen and pelvis with contrast for further evaluation 2. Moderate left foraminal stenosis at L3-4 due to leftward disc protrusion and asymmetric facet hypertrophy. 3. Moderate left and mild right foraminal narrowing at L4-5 due to leftward disc protrusion and asymmetric facet hypertrophy. 4. Mild bilateral foraminal narrowing at L5-S1 due to leftward disc protrusion and right asymmetric facet hypertrophy.  CT AP with contrast: New large left retroperitoneal mass involving the left psoas muscle and left paraaortic region with cortical destruction in the adjacent lumbar spine vertebral bodies, consistent with metastatic disease.   Mild left paraaortic and left iliac lymphadenopathy, consistent with metastatic disease.  Past medical history include Stage IB endometrial cancer s/p surgery and radiation, sleep apnea, chronic respiratory failure on home O2, NSTEMI, Type 2 diabetes,  depression, CHF, CAD, anemia, arthritis, neuromuscular disorder, HTN, morbid obesity, peripheral neuropathy. Oncology history is below:  Treatment History:     Oncology History Overview Note   MMR IHC: loss of MLH1 and PMS2 MSI - high MLH1 hyper methylation present   Tumor size 6 cm    Endometrial adenocarcinoma (HCC)   10/26/2021 Initial Biopsy     EMB: grade 1 endometrioid adenocarcinoma     11/21/2021 Imaging     CT A/P: 1. No evidence for metastatic disease in the abdomen or pelvis. 2. Endometrium appears thickened, but not well demonstrated by CT. Endometrial stripe appears to be 4.7 cm in thickness on sagittal CT imaging today. 3. Complex paraumbilical hernia contains only fat. 4. Skin thickening with soft tissue stranding noted anterior lower abdominal wall. This could be related to cellulitis and should be amenable to clinical inspection. 5. Advanced coronary artery calcification in the left coronary distribution.     12/06/2021 Initial Diagnosis     Endometrial adenocarcinoma (HCC)     12/06/2021 Surgery     Robotic-assisted laparoscopic total hysterectomy with bilateral salpingoophorectomy, laparoscopic hernia reduction Findings: On EUA, 12 cm mobile uterus.  On intra-abdominal entry, normal appearing upper abdominal survey although right liver edge and diaphragm obscured secondary to adhesions from cholecystectomy.  Normal-appearing omentum with portion of the omentum within a 3 cm umbilical hernia (reduced).  Normal-appearing small and large bowel.  Normal-appearing bilateral fallopian tubes and ovaries.  Uterus 12 cm in quite bulbous.  No obvious adenopathy.  No ascites.  No clear evidence of intra-abdominal or pelvic disease.     12/13/2021 Pathology Results  A. UTERUS, CERVIX, TUBES AND OVARIES:  - Endometrial adenocarcinoma with foci of squamous differentiation, FIGO  grade 1  - Carcinoma invades for a depth of 1.6 cm where myometrial thickness is  2.8 cm ( 60%)   - Carcinoma does not involve the cervix  - Benign bilateral fallopian tubes and ovaries  - See oncology table   ONCOLOGY TABLE:   UTERUS, CARCINOMA OR CARCINOSARCOMA: Resection   Procedure: Total hysterectomy and bilateral salpingo-oophorectomy  Histologic Type: Endometrioid carcinoma with foci of squamous  differentiation  Histologic Grade: FIGO grade 1  Myometrial Invasion:       Depth of Myometrial Invasion (mm): 16 mm       Myometrial Thickness (mm): 28 mm       Percentage of Myometrial Invasion: 57%  Uterine Serosa Involvement: Not identified  Cervical stromal Involvement: Not identified  Extent of involvement of other tissue/organs: Not identified  Peritoneal/Ascitic Fluid: Not applicable  Lymphovascular Invasion: Not identified  Regional Lymph Nodes: Not applicable (no lymph nodes submitted or found)        Pelvic Lymph Nodes Examined:                                   0 Sentinel                                   0 Non-sentinel                                   0 Total       Pelvic Lymph Nodes with Metastasis: Not applicable       Para-aortic Lymph Nodes Examined:                                    0 Sentinel                                    0 non-sentinel                                    0 total       Para-aortic Lymph Nodes with Metastasis: Not applicable  Distant Metastasis:       Distant Site(s) Involved: Not applicable  Pathologic Stage Classification (pTNM, AJCC 8th Edition): pT1b, pN not  assigned  Ancillary Studies: MMR / MSI testing will be ordered  Representative Tumor Block: A3      01/22/2022 - 03/07/2022 Radiation Therapy     01/22/2022 through 03/07/2022 Site Technique Total Dose (Gy) Dose per Fx (Gy) Completed Fx Beam Energies  Vagina: Pelvis HDR-brachy 30/30 6 5/5 Ir-192          Interval History:  The patient reports mild nausea for the past 2 weeks. Denies vaginal bleeding. She started to develop pain on her left side at least 3 months ago and  symptoms have gotten worse starting last week. No change in baseline respiratory status with home oxygen  used. No significant weight change or change in appetite. Bowels functioning without difficulty.   While in the hospital, the  pain has improved with use of muscle relaxer, tylenol , and intermittent narcotics. The pain is not as severe, but has been noted in new places. She reports pain initially in her left thigh with right pelvis hurting now. She has ambulated with PT. Discharge this afternoon discussed with patient and Hospitalist provider. She is currently on effient  (Hx CHF, has two stents) and will have to have this held for IR biopsy.  Review of Systems: See interval.  Current Meds: Current inpatient and outpatient medications reviewed  Allergy:  Allergies  Allergen Reactions   Hydrocodone Shortness Of Breath, Dermatitis and Other (See Comments)    I forget to breathe.   Clopidogrel Rash and Dermatitis    Social Hx:   Social History   Socioeconomic History   Marital status: Married    Spouse name: Not on file   Number of children: 2   Years of education: Not on file   Highest education level: Not on file  Occupational History   Occupation: Housewife  Tobacco Use   Smoking status: Former    Current packs/day: 0.00    Average packs/day: 0.5 packs/day for 1 year (0.5 ttl pk-yrs)    Types: Cigarettes, Cigars    Start date: 09/03/1985    Quit date: 09/03/1986    Years since quitting: 37.5   Smokeless tobacco: Never  Vaping Use   Vaping status: Never Used  Substance and Sexual Activity   Alcohol use: No   Drug use: No   Sexual activity: Not Currently  Other Topics Concern   Not on file  Social History Narrative   Not on file   Social Drivers of Health   Financial Resource Strain: High Risk (02/10/2021)   Overall Financial Resource Strain (CARDIA)    Difficulty of Paying Living Expenses: Hard  Food Insecurity: No Food Insecurity (03/29/2024)   Hunger Vital Sign     Worried About Running Out of Food in the Last Year: Never true    Ran Out of Food in the Last Year: Never true  Transportation Needs: No Transportation Needs (03/29/2024)   PRAPARE - Administrator, Civil Service (Medical): No    Lack of Transportation (Non-Medical): No  Physical Activity: Not on file  Stress: Not on file  Social Connections: Not on file  Intimate Partner Violence: Not At Risk (03/29/2024)   Humiliation, Afraid, Rape, and Kick questionnaire    Fear of Current or Ex-Partner: No    Emotionally Abused: No    Physically Abused: No    Sexually Abused: No    Past Surgical Hx:  Past Surgical History:  Procedure Laterality Date   CARDIAC CATHETERIZATION  12/02/2012   CHOLECYSTECTOMY OPEN  09/04/1987   CORONARY ANGIOPLASTY WITH STENT PLACEMENT  09/03/2009   CORONARY/GRAFT ACUTE MI REVASCULARIZATION N/A 10/26/2019   Procedure: CORONARY/GRAFT ACUTE MI REVASCULARIZATION;  Surgeon: Verlin Lonni BIRCH, MD;  Location: [facility] INVASIVE CV LAB;  Service: Cardiovascular;  Laterality: N/A;   INCISIONAL HERNIA REPAIR N/A 12/09/2021   Procedure: LAPAROSCOPIC REPAIR UMBILICAL HERNIA WITH MESH;  Surgeon: Vernetta Berg, MD;  Location: [facility] ORS;  Service: General;  Laterality: N/A;   LEFT HEART CATH AND CORONARY ANGIOGRAPHY N/A 10/26/2019   Procedure: LEFT HEART CATH AND CORONARY ANGIOGRAPHY;  Surgeon: Verlin Lonni BIRCH, MD;  Location: [facility] INVASIVE CV LAB;  Service: Cardiovascular;  Laterality: N/A;   LEFT HEART CATHETERIZATION WITH CORONARY ANGIOGRAM N/A 12/25/2012   Procedure: LEFT HEART CATHETERIZATION WITH CORONARY ANGIOGRAM;  Surgeon: Ozell Fell, MD;  Location: [facility]  CATH LAB;  Service: Cardiovascular;  Laterality: N/A;   ROBOTIC ASSISTED TOTAL HYSTERECTOMY WITH BILATERAL SALPINGO OOPHERECTOMY N/A 12/06/2021   Procedure: XI ROBOTIC ASSISTED TOTAL HYSTERECTOMY WITH BILATERAL SALPINGO OOPHORECTOMY;  Surgeon: [name] SAUNDERS, MD;  Location: [facility] ORS;  Service: Gynecology;   Laterality: N/A;   UMBILICAL HERNIA REPAIR  05/04/1989   doesn't think they used mesh    Past Medical Hx:  Past Medical History:  Diagnosis Date   Anemia    Arthritis    back, hands, hips (02/22/2015)   CAD (coronary artery disease)    a. complex LAD/diagonal bifurcation PCI in 2010. b. STEMI 10/2019 s/p PTCA/DES x1 to mLAD overlapping the old stent, residual disease treated medicaly.   Cancer [facility])    uterine   CHF (congestive heart failure) (HCC)    Depression    Diabetes mellitus (HCC)    started when I was pregnant; not sure if it was type 1 or type 2    History of radiation therapy    Endometrium- HDR 01/22/22-03/07/22- Dr. Lynwood Nasuti   Hypercholesterolemia    Hypertension    Morbid obesity (HCC)    Myocardial infarction (HCC)    mild x 3   Neuromuscular disorder (HCC)    neuropathy feet   Sleep apnea    cpap/    Family Hx:  Family History  Problem Relation Age of Onset   Coronary artery disease Mother        in her mid 40s   Hypertension Mother    Cancer Mother        skin   Heart attack Father        in his mid 78s   COPD Father    Stroke Sister    Coronary artery disease Sister    Diabetes Sister    Other Half-Sister    Thyroid  cancer Daughter    Prostate cancer Neg Hx    Colon cancer Neg Hx    Breast cancer Neg Hx    Endometrial cancer Neg Hx    Ovarian cancer Neg Hx    Pancreatic cancer Neg Hx     Vitals:  Blood pressure (!) 116/48, pulse 90, temperature 98 F (36.7 C), temperature source Oral, resp. rate 16, height 5' 2 (1.575 m), weight 202 lb 13.2 oz (92 kg), last menstrual period 10/30/2014, SpO2 98%.  Physical Exam (performed by Dr. Eldonna): Alert, oriented, sitting on side of bed eating lunch No lower extrem edema bilaterally See additions to note from Dr. Eldonna  Assessment/Plan: 63 year old female currently admitted with evidence of metastatic disease on imaging including a new retroperitoneal left paraspinous soft tissue mass  with mild left paraaortic and left iliac lymphadenopathy. Recommendation for biopsy with IR. Effient  will have to be held per IR policy so this has been arranged outpatient. Will try to move this to a sooner date. Differentials include recurrence of endometrial cancer. Dr. Eldonna also recommending CT scan of the chest to evaluate for metastatic disease given CT AP findings. All questions answered. Patient is aware she will receive a phone call from our office to arrange for the CT scan and attempt to move biopsy to a sooner time. She is advised to reach out for any needs.   Brenda JONETTA Epps, NP 03/30/2024, 1:53 PM

## 2024-03-30 NOTE — Evaluation (Signed)
 Occupational Therapy Evaluation Patient Details Name: Brenda [name] MRN: [id number] DOB: [birth year] Today's Date: 03/30/2024   History of Present Illness   63 y/o female who came to [facility] ED on 03/29/24 with LT sided lower back pain and LLE weakness causing a fall without injury just prior to admission.  Imaging revealed: Large left retroperitoneal mass is seen  involving the left psoas muscle and left paraaortic region with mild cortical destruction seen involving the adjacent lumbar vertebral  bodies. This is new in measures 7.6 x 7.3 cm, consistent with  metastatic disease  PMH includes: endometrial adenocarcinoma s/p total hysterectomy, BSO and vaginal brachytherapy completed 03/07/2022 with ongoing surveillance with GYN and radiation oncology as well as CAD, STEMI, HTN, DM II, morbid obesity.     Clinical Impressions Patient evaluated by Occupational Therapy with no further acute OT needs identified. All education has been completed and the patient has no further questions. Pt does endorse ongoing LLE pain and BLE pain at times with 4 falls in past 6 month due to LEs giving out.  As pt at or near her baseline with ADLs, OT will defer to PT for balance/gait training. Pt with handouts provided today on energy conservation and fall prevention.   See below for any follow-up Occupational Therapy or equipment needs. OT is signing off. Thank you for this referral.      If plan is discharge home, recommend the following:   Assistance with cooking/housework     Functional Status Assessment   Patient has had a recent decline in their functional status and demonstrates the ability to make significant improvements in function in a reasonable and predictable amount of time.     Equipment Recommendations   Other (comment) (Possible RW but will defer to PT recommendations.)     Recommendations for Other Services   PT consult     Precautions/Restrictions   Precautions Precautions:  Fall Restrictions Weight Bearing Restrictions Per Provider Order: No Other Position/Activity Restrictions: Currently NPO (7/28)     Mobility Bed Mobility Overal bed mobility: Modified Independent                  Transfers                          Balance Overall balance assessment: History of Falls, Needs assistance   Sitting balance-Leahy Scale: Normal     Standing balance support: Single extremity supported (at sink) Standing balance-Leahy Scale: Fair                             ADL either performed or assessed with clinical judgement   ADL Overall ADL's : At baseline                                       General ADL Comments: Pt able to perform LE dressing, UE ADLs,  standing at sink for oral hygiene, and functional bathroom mobility with RW without assistnce however SBA provided due to pt's h/o falls due to BLE weakness. Feeding not observed as pt NPO, but pt shows UE function and intact cogniton needed for self-feeding.     Vision Baseline Vision/History: 1 Wears glasses (at home) Ability to See in Adequate Light: 0 Adequate Additional Comments: RT eye laser surgery on Wednesday prior to admission.  Perception         Praxis         Pertinent Vitals/Pain Pain Assessment Pain Assessment: 0-10 Pain Score: 4  Pain Location: LLE Pain Descriptors / Indicators: Dull, Shooting Pain Intervention(s): Limited activity within patient's tolerance, Monitored during session, RN gave pain meds during session, Premedicated before session, Repositioned     Extremity/Trunk Assessment Upper Extremity Assessment Upper Extremity Assessment: Generalized weakness;Right hand dominant   Lower Extremity Assessment Lower Extremity Assessment: RLE deficits/detail;LLE deficits/detail RLE Sensation: history of peripheral neuropathy LLE Sensation: history of peripheral neuropathy       Communication Communication Communication:  No apparent difficulties   Cognition Arousal: Alert Behavior During Therapy: WFL for tasks assessed/performed Cognition: No apparent impairments                               Following commands: Intact       Cueing  General Comments          Exercises Other Exercises Other Exercises: Pt provided with education and handouts on both energy conservation including the 4 Ps, use of the RPE to judge when to take a rest break and use of AE/DME, as well as fall prevention with possible change in AD for increased LB support (PT notifiied to coordinate). Pt verbalized understanding to all education.   Shoulder Instructions      Home Living Family/patient expects to be discharged to:: Private residence Living Arrangements: Other relatives Available Help at Discharge:  (who is in high school.) Type of Home: House Home Access: Ramped entrance     Home Layout: One level     Bathroom Shower/Tub: Chief Strategy Officer: Standard     Home Equipment: Rollator (4 wheels);Shower seat;Grab bars - tub/shower;Grab bars - toilet;Hand held shower head;Adaptive equipment Adaptive Equipment: Reacher        Prior Functioning/Environment Prior Level of Function : History of Falls (last six months);Independent/Modified Independent;Driving (Endorses ~4 falls in past 6 months.  Additional falls when trying to stand back up.)             Mobility Comments: Rollator PRN but not needed in a while. Falls. States I get dizzy in my stomach and both legs tend to go out. ADLs Comments: Patient has not needed any assist for ADL/IADL.  Shares chores with nephew. On disability.    OT Problem List: Decreased activity tolerance;Pain;Impaired sensation;Obesity   OT Treatment/Interventions:        OT Goals(Current goals can be found in the care plan section)   Acute Rehab OT Goals OT Goal Formulation: All assessment and education complete, DC therapy ADL  Goals Additional ADL Goal #1: Patient will identify at least 3 energy conservation strategies to employ at home in order to maximize function and quality of life and decrease caregiver burden while preventing exacerbation of symptoms and rehospitalization. Additional ADL Goal #2: Patient will identify at least 3 fall prevention strategies to employ at home in order to maximize function and safety during ADLs and decrease caregiver burden while preventing possible injury and rehospitalization.   OT Frequency:       Co-evaluation              AM-PAC OT 6 Clicks Daily Activity     Outcome Measure Help from another person eating meals?: None Help from another person taking care of personal grooming?: A Little Help from another person toileting, which includes using toliet,  bedpan, or urinal?: A Little Help from another person bathing (including washing, rinsing, drying)?: A Little Help from another person to put on and taking off regular upper body clothing?: A Little Help from another person to put on and taking off regular lower body clothing?: A Little 6 Click Score: 19   End of Session Equipment Utilized During Treatment: Gait belt;Rolling walker (2 wheels);Oxygen  Nurse Communication: Other (comment) (RN approved no chair alarm for pt. None found on floor.)  Activity Tolerance: Patient tolerated treatment well;Patient limited by pain Patient left: in chair;with call bell/phone within reach  OT Visit Diagnosis: History of falling (Z91.81);Repeated falls (R29.6);Pain;Unsteadiness on feet (R26.81) Pain - Right/Left: Left Pain - part of body: Leg                Time: 9159-9079 OT Time Calculation (min): 40 min Charges:  OT General Charges $OT Visit: 1 Visit OT Evaluation $OT Eval Low Complexity: 1 Low OT Treatments $Self Care/Home Management : 8-22 mins $Therapeutic Activity: 8-22 mins  Delon, OT Acute Rehab Services Office: [phone] 03/30/2024   Delon Falter 03/30/2024, 9:33 AM

## 2024-03-30 NOTE — Evaluation (Signed)
 Physical Therapy Evaluation Patient Details Name: Brenda [name] MRN: [id number] DOB: 1960/11/27 Today's Date: 03/30/2024  History of Present Illness  63 y/o female who came to [facility] ED on 03/29/24 with LT sided lower back pain and LLE weakness causing a fall without injury just prior to admission.  Imaging revealed: Large left retroperitoneal mass is seen  involving the left psoas muscle and left paraaortic region with mild cortical destruction seen involving the adjacent lumbar vertebral  bodies. This is new in measures 7.6 x 7.3 cm, consistent with  metastatic disease  PMH includes: endometrial adenocarcinoma s/p total hysterectomy, BSO and vaginal brachytherapy completed 03/07/2022 with ongoing surveillance with GYN and radiation oncology as well as CAD, STEMI, HTN, DM II, morbid obesity.  Clinical Impression    Pt admitted with above diagnosis.  Pt currently with functional limitations due to the deficits listed below (see PT Problem List). Pt in bed when PT arrived. Pt agreeable to therapy intervention. Pt is mod I for bed mobility and transfer tasks no AD, gait tasks in hallway assessed with RW and S with min cues and supplemental O2 2 L/min. Pt is on 2 L/min supplemental O2 at baseline. Pt elected to return to bed and all needs in place. Pt will benefit from acute skilled PT to increase their independence and safety with mobility to allow discharge.         If plan is discharge home, recommend the following: Assistance with cooking/housework;Assist for transportation   Can travel by private vehicle        Equipment Recommendations None recommended by PT  Recommendations for Other Services       Functional Status Assessment Patient has had a recent decline in their functional status and demonstrates the ability to make significant improvements in function in a reasonable and predictable amount of time.     Precautions / Restrictions Precautions Precautions: Fall Restrictions Weight  Bearing Restrictions Per Provider Order: No Other Position/Activity Restrictions: Currently NPO (7/28)      Mobility  Bed Mobility Overal bed mobility: Modified Independent             General bed mobility comments: supine <> sit    Transfers Overall transfer level: Modified independent Equipment used: None               General transfer comment: sit to stand from EOB and SPT recliner to bed no AD and no overt LOB or LE instability noted with transfer tasks    Ambulation/Gait Ambulation/Gait assistance: Supervision Gait Distance (Feet): 150 Feet Assistive device: Rolling walker (2 wheels) Gait Pattern/deviations: Step-through pattern Gait velocity: slightly decreased     General Gait Details: pt demonstrated good reciprocal pattern, LEs passing in stance phase and appropriate foot clearance, pt on 2 L/min throughout intervention and reports of fatigue with prolonged distances pt will benefit from gait trial with rollator  Stairs            Wheelchair Mobility     Tilt Bed    Modified Rankin (Stroke Patients Only)       Balance Overall balance assessment: History of Falls, Needs assistance   Sitting balance-Leahy Scale: Normal     Standing balance support: No upper extremity supported Standing balance-Leahy Scale: Fair Standing balance comment: static standing no UE support                             Pertinent Vitals/Pain Pain Assessment Pain Assessment:  0-10 Pain Score: 5  Pain Location: LLE Pain Descriptors / Indicators: Dull, Shooting Pain Intervention(s): Limited activity within patient's tolerance, Monitored during session, Repositioned    Home Living Family/patient expects to be discharged to:: Private residence Living Arrangements: Other relatives Available Help at Discharge:  (who is in high school.) Type of Home: House Home Access: Ramped entrance       Home Layout: One level Home Equipment: Rollator (4  wheels);Shower seat;Grab bars - tub/shower;Grab bars - toilet;Hand held shower head;Adaptive equipment      Prior Function Prior Level of Function : History of Falls (last six months);Independent/Modified Independent;Driving (Endorses ~4 falls in past 6 months.  Additional falls when trying to stand back up.)             Mobility Comments: Rollator PRN but not needed in a while. Falls. States I get dizzy in my stomach and both legs tend to go out. ADLs Comments: Patient has not needed any assist for ADL/IADL.  Shares chores with nephew. On disability.     Extremity/Trunk Assessment   Upper Extremity Assessment Upper Extremity Assessment: Generalized weakness;Right hand dominant    Lower Extremity Assessment Lower Extremity Assessment: Generalized weakness RLE Sensation: history of peripheral neuropathy LLE Sensation: history of peripheral neuropathy       Communication   Communication Communication: No apparent difficulties    Cognition Arousal: Alert Behavior During Therapy: WFL for tasks assessed/performed                             Following commands: Intact       Cueing       General Comments      Exercises     Assessment/Plan    PT Assessment Patient needs continued PT services  PT Problem List Decreased strength;Decreased activity tolerance;Decreased balance;Decreased mobility;Pain       PT Treatment Interventions DME instruction;Gait training;Stair training;Functional mobility training;Therapeutic activities;Therapeutic exercise;Balance training;Cognitive remediation    PT Goals (Current goals can be found in the Care Plan section)  Acute Rehab PT Goals Patient Stated Goal: to be able to go home PT Goal Formulation: With patient Time For Goal Achievement: 04/13/24 Potential to Achieve Goals: Good    Frequency Min 3X/week     Co-evaluation               AM-PAC PT 6 Clicks Mobility  Outcome Measure Help needed turning  from your back to your side while in a flat bed without using bedrails?: None Help needed moving from lying on your back to sitting on the side of a flat bed without using bedrails?: None Help needed moving to and from a bed to a chair (including a wheelchair)?: None Help needed standing up from a chair using your arms (e.g., wheelchair or bedside chair)?: None Help needed to walk in hospital room?: A Little Help needed climbing 3-5 steps with a railing? : A Lot 6 Click Score: 21    End of Session Equipment Utilized During Treatment: Gait belt;Oxygen  Activity Tolerance: Patient tolerated treatment well Patient left: in bed;with call bell/phone within reach;with family/visitor present Nurse Communication: Mobility status PT Visit Diagnosis: Unsteadiness on feet (R26.81);History of falling (Z91.81);Pain Pain - Right/Left: Left Pain - part of body: Leg;Hip;Knee    Time: 1015-1028 PT Time Calculation (min) (ACUTE ONLY): 13 min   Charges:   PT Evaluation $PT Eval Low Complexity: 1 Low   PT General Charges $$ ACUTE PT VISIT: 1  Visit         Brenda, PT Acute Rehab   Brenda [name] 03/30/2024, 10:39 AM

## 2024-03-30 NOTE — Inpatient Diabetes Management (Signed)
 Inpatient Diabetes Program Recommendations  AACE/ADA: New Consensus Statement on Inpatient Glycemic Control (2015)  Target Ranges:  Prepandial:   less than 140 mg/dL      Peak postprandial:   less than 180 mg/dL (1-2 hours)      Critically ill patients:  140 - 180 mg/dL   Lab Results  Component Value Date   GLUCAP 199 (H) 03/30/2024   HGBA1C 9.2 (H) 03/29/2024    Review of Glycemic Control  Diabetes history: DM2 Outpatient Diabetes medications: Tresiba 20 at bedtime, Fiasp  5 units ac B and L, 8 units ac D, metformin 1000 mg BID, Wegovy  0.25 mg weekly Current orders for Inpatient glycemic control: Semglee  15 units at bedtime, Novolog  0-15 TID with meals and 0-5 HS  HgbA1C - 9.2% FBS and post-prandials elevated  Inpatient Diabetes Program Recommendations:    Consider increasing Semglee  to 18 units QHS  Add Novolog  4 units TID with meals if eating > 50% when diet restarts. Pt NPO at present  Continue to follow.  Thank you. Shona Brandy, RD, LDN, CDCES Inpatient Diabetes Coordinator [phone]

## 2024-03-30 NOTE — Discharge Instructions (Signed)

## 2024-03-31 ENCOUNTER — Encounter: Payer: Self-pay | Admitting: Oncology

## 2024-03-31 ENCOUNTER — Telehealth: Payer: Self-pay | Admitting: Oncology

## 2024-03-31 ENCOUNTER — Other Ambulatory Visit: Payer: Self-pay | Admitting: Gynecologic Oncology

## 2024-03-31 DIAGNOSIS — C541 Malignant neoplasm of endometrium: Secondary | ICD-10-CM

## 2024-03-31 DIAGNOSIS — R1900 Intra-abdominal and pelvic swelling, mass and lump, unspecified site: Secondary | ICD-10-CM

## 2024-03-31 NOTE — Telephone Encounter (Signed)
 Called Brenda [name] and advised her of the new patient appointment with Dr. Lonn on 04/16/2024 at 2:00 with arrival to the cancer center at 1:30 followed by patient education at 4:00.

## 2024-03-31 NOTE — Telephone Encounter (Signed)
 Left 2 messages regarding CT Biopsy.  Requested a return call.

## 2024-03-31 NOTE — Telephone Encounter (Signed)
 Called Clorinda (husband) and advised him of the CT biopsy appointment at [facility] on 04/08/2024.  He would like to go ahead and schedule the appointment and will make sure Karman starts to hold the Effient  today.  Courtny also called back and was advised of appointment for the CT biopsy on 04/08/2024 with arrival at 10:00 to [facility].  Advised her to hold the Effient  starting today and to resume per radiology instructions after the biopsy.  Also advised her of the CT chest appointment at [facility] on 04/03/24 with arrival at 4:30 to check in.  Discussed a port a cath was ordered and the IR will be calling her with an appointment. She verbalized understanding of all the appointments and instructions and knows to call with any questions.

## 2024-04-03 ENCOUNTER — Ambulatory Visit (HOSPITAL_COMMUNITY)
Admission: RE | Admit: 2024-04-03 | Discharge: 2024-04-03 | Disposition: A | Discharge status: Home / Self Care | Source: Ambulatory Visit | Attending: Gynecologic Oncology | Admitting: Gynecologic Oncology

## 2024-04-03 DIAGNOSIS — R1900 Intra-abdominal and pelvic swelling, mass and lump, unspecified site: Secondary | ICD-10-CM | POA: Diagnosis present

## 2024-04-03 MED ORDER — IOHEXOL 300 MG/ML  SOLN
75.0000 mL | Freq: Once | INTRAMUSCULAR | Status: AC | PRN
  Administered 2024-04-03: 75 mL via INTRAVENOUS

## 2024-04-06 ENCOUNTER — Other Ambulatory Visit: Payer: Self-pay | Admitting: Radiology

## 2024-04-06 ENCOUNTER — Ambulatory Visit: Admitting: Skilled Nursing Facility1

## 2024-04-06 ENCOUNTER — Telehealth: Payer: Self-pay | Admitting: Oncology

## 2024-04-06 DIAGNOSIS — C541 Malignant neoplasm of endometrium: Secondary | ICD-10-CM

## 2024-04-06 DIAGNOSIS — R630 Anorexia: Secondary | ICD-10-CM | POA: Insufficient documentation

## 2024-04-06 NOTE — H&P (Signed)
 Chief Complaint: Patient was seen in consultation today for new retroperitoneal mass   Procedure: Peritoneal/Retroperitoneal Mass Biopsy and Port-a-catheter placement  Referring Physician(s): Gorsuch,Ni  Supervising Physician: Vanice Revel  Patient Status: [facility] - Out-pt  History of Present Illness: Brenda [name] is a 63 y.o. female with a history of arthritis, obesity, CHF, CAD, anemia, and endometrial cancer s/p total hysterectomy with bilateral salpingo-oophorectomy and radiation therapy who initially presented to the [facility] ED on 7/27 with complaints of left leg and back pain for multiple months. She had previously been told it was likely bursitis of the hip by Ortho; however, ED workup revealed imaging concerning for a retroperitoneal left paraspinous soft tissue mass measuring 7.5 x 5.5 x 8.5cm centered at the L2-3 level and mild left paraaortic and left iliac lymphadenopathy concerning for metastatic disease. IR was consulted while she was inpatient at [facility]; however, unfortunately due to the patient being on Prasugrel , the biopsy had to be rescheduled. She was instructed to hold her blood thinner for 7 days and presents today for her biopsy and port placement.   Patient reports *** She denies any *** States that her last dose of Prasugrel  was ***. NPO since midnight. All questions and concerns answered at the bedside.  Code Status: ***  Past Medical History:  Diagnosis Date   Anemia    Arthritis    back, hands, hips (02/22/2015)   CAD (coronary artery disease)    a. complex LAD/diagonal bifurcation PCI in 2010. b. STEMI 10/2019 s/p PTCA/DES x1 to mLAD overlapping the old stent, residual disease treated medicaly.   Cancer [facility])    uterine   CHF (congestive heart failure) (HCC)    Depression    Diabetes mellitus (HCC)    started when I was pregnant; not sure if it was type 1 or type 2    History of radiation therapy    Endometrium- HDR 01/22/22-03/07/22- Dr. Lynwood Nasuti   Hypercholesterolemia    Hypertension    Morbid obesity (HCC)    Myocardial infarction (HCC)    mild x 3   Neuromuscular disorder (HCC)    neuropathy feet   Sleep apnea    cpap/    Past Surgical History:  Procedure Laterality Date   CARDIAC CATHETERIZATION  12/02/2012   CHOLECYSTECTOMY OPEN  09/04/1987   CORONARY ANGIOPLASTY WITH STENT PLACEMENT  09/03/2009   CORONARY/GRAFT ACUTE MI REVASCULARIZATION N/A 10/26/2019   Procedure: CORONARY/GRAFT ACUTE MI REVASCULARIZATION;  Surgeon: Verlin Lonni BIRCH, MD;  Location: [facility] INVASIVE CV LAB;  Service: Cardiovascular;  Laterality: N/A;   INCISIONAL HERNIA REPAIR N/A 12/09/2021   Procedure: LAPAROSCOPIC REPAIR UMBILICAL HERNIA WITH MESH;  Surgeon: Vernetta Berg, MD;  Location: [facility] ORS;  Service: General;  Laterality: N/A;   LEFT HEART CATH AND CORONARY ANGIOGRAPHY N/A 10/26/2019   Procedure: LEFT HEART CATH AND CORONARY ANGIOGRAPHY;  Surgeon: Verlin Lonni BIRCH, MD;  Location: [facility] INVASIVE CV LAB;  Service: Cardiovascular;  Laterality: N/A;   LEFT HEART CATHETERIZATION WITH CORONARY ANGIOGRAM N/A 12/25/2012   Procedure: LEFT HEART CATHETERIZATION WITH CORONARY ANGIOGRAM;  Surgeon: Ozell Fell, MD;  Location: [facility] CATH LAB;  Service: Cardiovascular;  Laterality: N/A;   ROBOTIC ASSISTED TOTAL HYSTERECTOMY WITH BILATERAL SALPINGO OOPHERECTOMY N/A 12/06/2021   Procedure: XI ROBOTIC ASSISTED TOTAL HYSTERECTOMY WITH BILATERAL SALPINGO OOPHORECTOMY;  Surgeon: [name] SAUNDERS, MD;  Location: [facility] ORS;  Service: Gynecology;  Laterality: N/A;   UMBILICAL HERNIA REPAIR  05/04/1989   doesn't think they used mesh    Allergies:  Hydrocodone and Clopidogrel  Medications: Prior to Admission medications   Medication Sig Start Date End Date Taking? Authorizing Provider  acetaminophen  (TYLENOL ) 500 MG tablet Take 1-2 tablets (500-1,000 mg total) by mouth every 8 (eight) hours as needed for mild pain (pain score 1-3) or moderate pain (pain  score 4-6). 03/30/24 03/30/25  Hongalgi, Anand D, MD  atorvastatin  (LIPITOR ) 80 MG tablet Take 1 tablet (80 mg total) by mouth every evening. Patient taking differently: Take 80 mg by mouth at bedtime. 10/03/21   Newlin, Enobong, MD  buPROPion  (WELLBUTRIN  XL) 150 MG 24 hr tablet Take 150 mg by mouth in the morning.    [provider]  Cholecalciferol  (VITAMIN D3 SUPER STRENGTH) 50 MCG (2000 UT) CAPS Take 2,000 Units by mouth in the morning.    [provider]  Continuous Glucose Sensor (FREESTYLE LIBRE 3 SENSOR) MISC Inject 1 Device into the skin every 14 (fourteen) days.    [provider]  FIASP  FLEXTOUCH 100 UNIT/ML FlexTouch Pen Inject 5-8 Units into the skin See admin instructions. Inject 5 units into the skin before breakfast & lunch and 8 units before supper/evening meal 12/08/22   [provider]  FLUoxetine  (PROZAC ) 40 MG capsule Take 40 mg by mouth daily. 01/17/23   [provider]  furosemide  (LASIX ) 40 MG tablet Take 1 tablet by mouth once daily Patient taking differently: Take 40 mg by mouth in the morning. 02/05/22   Newlin, Enobong, MD  insulin  degludec (TRESIBA) 100 UNIT/ML FlexTouch Pen Inject 20 Units into the skin every evening.    [provider]  losartan  (COZAAR ) 25 MG tablet Take 1 tablet (25 mg total) by mouth daily. 07/19/23   Cooper, Michael, MD  metFORMIN (GLUCOPHAGE-XR) 500 MG 24 hr tablet Take 1,000 mg by mouth in the morning and at bedtime. 07/05/22   [provider]  methocarbamol  (ROBAXIN ) 500 MG tablet Take 1 tablet (500 mg total) by mouth every 8 (eight) hours as needed for muscle spasms. 03/30/24   Hongalgi, Trenda BIRCH, MD  Misc. Devices MISC Portable oxygen  concentrator, 2L oxygen .  Diagnoses-chronic respiratory failure with hypoxia 11/27/21   Delbert Clam, MD  nitroGLYCERIN  (NITROSTAT ) 0.4 MG SL tablet Dissolve 1 tablet under the tongue every 5 minutes as needed for chest pain. Max of 3 doses, then 911. Patient  taking differently: Place 0.4 mg under the tongue every 5 (five) minutes x 3 doses as needed for chest pain (CALL 9-1-1, IF NO RELIEF). 01/25/23   Lelon Hamilton T, PA-C  oxyCODONE  (OXY IR/ROXICODONE ) 5 MG immediate release tablet Take 1 tablet (5 mg total) by mouth every 8 (eight) hours as needed for severe pain (pain score 7-10). 03/30/24   Hongalgi, Trenda BIRCH, MD  OXYGEN  Inhale 2 L/min into the lungs continuous.    [provider]  polyethylene glycol powder (GLYCOLAX /MIRALAX ) 17 GM/SCOOP powder Take 17 g by mouth daily as needed for mild constipation. 09/17/23   [provider]  prasugrel  (EFFIENT ) 5 MG TABS tablet Take 1 tablet (5 mg total) by mouth in the morning. AFTER TAKING YOUR DOSE ON 04/07/2024, STOP TAKING IT IN PREPARATION FOR THE BIOPSY ON 04/15/2024. AFTER THE BIOPSY, FOLLOW THE RADIOLOGIST RECOMMENDATIONS AS TO TIMING OF RESTARTING THIS MEDICINE. 03/30/24   Hongalgi, Anand D, MD  prednisoLONE  acetate (PRED FORTE ) 1 % ophthalmic suspension Place 1 drop into the left eye See admin instructions. INSTILL 1 DROP INTO LEFT EYE 4 TIMES DAILY FOR 1 WEEK, 3 TIMES DAILY FOR 1 WEEK, TWICE  DAILY FOR 1 WEEK, ONCE DAILY FOR 1 WEEK, THEN STOP 03/13/24   [provider]  pregabalin  (LYRICA ) 100 MG capsule Take 1 capsule (100 mg total) by mouth 3 (three) times daily. Patient taking differently: Take 100 mg by mouth in the morning, at noon, and at bedtime. 02/05/24   Christine Rush, DPM  Respiratory Therapy Supplies (CARETOUCH 2 CPAP HOSE HANGER) MISC daily at night    [provider]  WEGOVY  0.25 MG/0.5ML SOAJ Inject 0.25 mg into the skin every Wednesday. 11/12/23   [provider]     Family History  Problem Relation Age of Onset   Coronary artery disease Mother        in her mid 5s   Hypertension Mother    Cancer Mother        skin   Heart attack Father        in his mid 80s   COPD Father    Stroke Sister    Coronary artery disease Sister    Diabetes Sister     Other Half-Sister    Thyroid  cancer Daughter    Prostate cancer Neg Hx    Colon cancer Neg Hx    Breast cancer Neg Hx    Endometrial cancer Neg Hx    Ovarian cancer Neg Hx    Pancreatic cancer Neg Hx     Social History   Socioeconomic History   Marital status: Married    Spouse name: Not on file   Number of children: 2   Years of education: Not on file   Highest education level: Not on file  Occupational History   Occupation: Housewife  Tobacco Use   Smoking status: Former    Current packs/day: 0.00    Average packs/day: 0.5 packs/day for 1 year (0.5 ttl pk-yrs)    Types: Cigarettes, Cigars    Start date: 09/03/1985    Quit date: 09/03/1986    Years since quitting: 37.6   Smokeless tobacco: Never  Vaping Use   Vaping status: Never Used  Substance and Sexual Activity   Alcohol use: No   Drug use: No   Sexual activity: Not Currently  Other Topics Concern   Not on file  Social History Narrative   Not on file   Social Drivers of Health   Financial Resource Strain: High Risk (02/10/2021)   Overall Financial Resource Strain (CARDIA)    Difficulty of Paying Living Expenses: Hard  Food Insecurity: No Food Insecurity (03/29/2024)   Hunger Vital Sign    Worried About Running Out of Food in the Last Year: Never true    Ran Out of Food in the Last Year: Never true  Transportation Needs: No Transportation Needs (03/29/2024)   PRAPARE - Administrator, Civil Service (Medical): No    Lack of Transportation (Non-Medical): No  Physical Activity: Not on file  Stress: Not on file  Social Connections: Not on file    Review of Systems Denies any N/V, chest pain, shortness of breath, fevers/chills. All other ROS negative.  Vital Signs: LMP 10/30/2014 (Approximate)     Physical Exam  Imaging: CT Chest W Contrast Result Date: 04/03/2024 CLINICAL DATA:  Genital cancer, staging Per Dr. Eldonna, new large retroperitoneal mass with lymphadenopathy concerning for  recurrence, hx of endometrial cancer s/p surgery and radiation. * Tracking Code: BO * EXAM: CT CHEST WITH CONTRAST TECHNIQUE: Multidetector CT imaging of the chest was performed during intravenous contrast administration. RADIATION DOSE REDUCTION: This exam was  performed according to the departmental dose-optimization program which includes automated exposure control, adjustment of the mA and/or kV according to patient size and/or use of iterative reconstruction technique. CONTRAST:  75mL OMNIPAQUE  IOHEXOL  300 MG/ML  SOLN COMPARISON:  CT Angiography chest from 10/26/2019. FINDINGS: Cardiovascular: Normal cardiac size. No pericardial effusion. No aortic aneurysm. There are coronary artery calcifications, in keeping with coronary artery disease. There are also mild peripheral atherosclerotic vascular calcifications of thoracic aorta and its major branches. Mitral annulus calcifications noted. Mediastinum/Nodes: Visualized thyroid  gland appears grossly unremarkable. No solid / cystic mediastinal masses. The esophagus is nondistended precluding optimal assessment. There is mild circumferential thickening of the lower thoracic esophagus, which is most likely seen in the settings of chronic gastroesophageal reflux disease versus esophagitis. No axillary, mediastinal or hilar lymphadenopathy by size criteria. Lungs/Pleura: The central tracheo-bronchial tree is patent. There are patchy areas of linear, plate-like atelectasis and/or scarring throughout bilateral lungs. There is a new spiculated marginated 9 x 12 mm nodule in the posterior segment of right upper lobe, highly concerning for metastases. There is an additional, part solid nodule in the right upper lobe (series 5, image 49), which measures 4 x 6 mm. There is a 2 x 4 mm noncalcified nodule in the left lung apex (series 5, image 33), which is indeterminate. No lung mass, consolidation, pleural effusion or pneumothorax. Upper Abdomen: There is dilation of  extrahepatic bile duct and central intrahepatic bile ducts. Please refer to recent CT scan abdomen and pelvis report for details. Remaining visualized upper abdominal viscera within normal limits. Musculoskeletal: The visualized soft tissues of the chest wall are grossly unremarkable. No suspicious osseous lesions. There are minimal multilevel degenerative changes in the visualized spine. IMPRESSION: 1. There is a new spiculated marginated 9 x 12 mm nodule in the posterior segment of right upper lobe, highly concerning for metastases. There is an additional, part solid nodule in the right upper lobe , which measures 4 x 6 mm. 2. No lung mass, consolidation, pleural effusion or pneumothorax. 3. No mediastinal, hilar or axillary lymphadenopathy by size criteria. 4. Multiple other nonacute observations, as described above. Aortic Atherosclerosis (ICD10-I70.0). Electronically Signed   By: Ree Molt M.D.   On: 04/03/2024 17:23   DG Lumbar Spine Complete Result Date: 04/01/2024 CLINICAL DATA:  Low back pain EXAM: LUMBAR SPINE - COMPLETE 4+ VIEW COMPARISON:  None Available. FINDINGS: There is no evidence of lumbar spine fracture. Alignment is normal. Intervertebral disc spaces are maintained. There mild degenerative endplate osteophytes throughout the lumbar spine. Air-fluid level in the pelvis may be within the bladder. IMPRESSION: 1. Mild degenerative changes of the lumbar spine. 2. Air-fluid level in the pelvis may be within the bladder. Electronically Signed   By: Greig Pique M.D.   On: 04/01/2024 22:45   DG FEMUR MIN 2 VIEWS LEFT Result Date: 04/01/2024 CLINICAL DATA:  Low back pain EXAM: LEFT FEMUR 2 VIEWS COMPARISON:  None Available. FINDINGS: There is no evidence of fracture or other focal bone lesions. Peripheral vascular calcifications are present. Joint spaces are well maintained. IMPRESSION: 1. No acute fracture or dislocation. 2. Peripheral vascular calcifications. Electronically Signed   By: Greig Pique M.D.   On: 04/01/2024 22:44   CT ABDOMEN PELVIS W CONTRAST Result Date: 03/29/2024 CLINICAL DATA:  Left pelvic and back pain. Uterine carcinoma. * Tracking Code: BO * EXAM: CT ABDOMEN AND PELVIS WITH CONTRAST TECHNIQUE: Multidetector CT imaging of the abdomen and pelvis was performed using the standard protocol following  bolus administration of intravenous contrast. RADIATION DOSE REDUCTION: This exam was performed according to the departmental dose-optimization program which includes automated exposure control, adjustment of the mA and/or kV according to patient size and/or use of iterative reconstruction technique. CONTRAST:  OMNIPAQUE  IOHEXOL  300 MG/ML  SOLN COMPARISON:  12/08/2021 FINDINGS: Lower Chest: No acute findings. Hepatobiliary: No suspicious hepatic masses identified. Prior cholecystectomy. Stable diffuse biliary ductal dilatation, likely due to post cholecystectomy effect. Pancreas:  No mass or inflammatory changes. Spleen: Within normal limits in size and appearance. Adrenals/Urinary Tract: No suspicious masses identified. No evidence of ureteral calculi or hydronephrosis. Gas seen in urinary bladder, presumably from recent instrumentation. Stomach/Bowel: Tiny hiatal hernia again noted. No evidence of obstruction, inflammatory process or abnormal fluid collections. Vascular/Lymphatic: Large left retroperitoneal mass is seen involving the left psoas muscle and left paraaortic region with mild cortical destruction seen involving the adjacent lumbar vertebral bodies. This is new in measures 7.6 x 7.3 cm, consistent with metastatic disease. Mild lymphadenopathy is also seen elsewhere in the left paraaortic region and left iliac chains, with largest measuring 1.8 cm in the left external iliac chain. No acute vascular findings. Reproductive:  Prior hysterectomy. Other:  Small fat-containing umbilical hernia. Musculoskeletal:  No suspicious bone lesions identified. IMPRESSION: New large  left retroperitoneal mass involving the left psoas muscle and left paraaortic region with cortical destruction in the adjacent lumbar spine vertebral bodies, consistent with metastatic disease. Mild left paraaortic and left iliac lymphadenopathy, consistent with metastatic disease. Electronically Signed   By: Norleen DELENA Kil M.D.   On: 03/29/2024 11:37   CT PELVIS WO CONTRAST Result Date: 03/29/2024 CLINICAL DATA:  Left leg and lower back pain. History of uterine cancer. EXAM: CT PELVIS WITHOUT CONTRAST TECHNIQUE: Multidetector CT imaging of the pelvis was performed following the standard protocol without intravenous contrast. RADIATION DOSE REDUCTION: This exam was performed according to the departmental dose-optimization program which includes automated exposure control, adjustment of the mA and/or kV according to patient size and/or use of iterative reconstruction technique. COMPARISON:  12/08/2021 FINDINGS: Urinary Tract:  Gas is identified in the urinary bladder. Bowel:  Unremarkable visualized pelvic bowel loops. Vascular/Lymphatic: Atherosclerotic calcification noted in the common iliac arteries bilaterally. r 2.9 x 1.4 cm soft tissue nodule is seen along the course of the left external iliac artery and vein (52/6) with adjacent probable 12 mm short axis left pelvic sidewall lymph node on 62/6. Reproductive:  Uterus surgically absent. Other:  No intraperitoneal free fluid. Musculoskeletal: No worrisome lytic or sclerotic osseous abnormality. IMPRESSION: 1. New 2.9 x 1.4 cm soft tissue nodule along the course of the left external iliac artery and vein with adjacent probable 12 mm short axis left pelvic sidewall lymph node. By report, the patient is status post total abdominal hysterectomy with bilateral salpingo oophorectomy on 12/06/2021. As such, this soft tissue nodularity along the left pelvic sidewall is unlikely to represent ovary. In the setting of a uterine cancer diagnosis, metastatic disease is a  distinct concern. Dedicated CT abdomen/pelvis with oral and intravenous contrast recommended to further evaluate. 2. Gas in the urinary bladder. In the absence of recent instrumentation, bladder infection would be a primary consideration. 3.  Aortic Atherosclerosis (ICD10-I70.0). Electronically Signed   By: Camellia Candle M.D.   On: 03/29/2024 09:16   CT Lumbar Spine Wo Contrast Result Date: 03/29/2024 EXAM: CT OF THE LUMBAR SPINE WITHOUT CONTRAST 03/29/2024 08:11:43 AM TECHNIQUE: CT of the lumbar spine was performed without the administration of intravenous contrast. Multiplanar  reformatted images are provided for review. Automated exposure control, iterative reconstruction, and/or weight based adjustment of the mA/kV was utilized to reduce the radiation dose to as low as reasonably achievable. COMPARISON: None available. CLINICAL HISTORY: Lumbar radiculopathy, cancer suspected. Per triage notes: Patient coming to ED for evaluation of L leg pain and lower back pain. Reports pain started 2 months ago. Has worsened over the last several days. Was seen by PCP on Thursday and was instructed to follow up with neurologist. Unable to sleep this weekend due to pain. No reports of injuries or new weakness. FINDINGS: BONES AND ALIGNMENT: Mild rightward curvature is present in the lumbar spine. No significant listhesis is present. DEGENERATIVE CHANGES: A leftward disc protrusion and asymmetric facet hypertrophy contribute to moderate left foraminal stenosis at L3-4. A leftward disc protrusion and asymmetric facet hypertrophy contribute to moderate left and mild right foraminal narrowing at L4-5. A leftward disc protrusion is present at L5 to S1. Facet hypertrophy is asymmetric on the right. Mild foraminal narrowing is present bilaterally. SOFT TISSUES: A retroperitoneal left paraspinous soft tissue mass measures 7.5 x 5.5 x 8.5 cm, centered at the L2-3 level. The tumor encroaches on the lateral margin of the left foramen  at L2-3. IMPRESSION: 1. Retroperitoneal left paraspinous soft tissue mass measuring 7.5 x 5.5 x 8.5 cm, centered at the L2-3 level, encroaching on the lateral margin of the left foramen at L2-3. This is most concerning for metastatic disease. Primary sarcoma could have a similar appearance. Recommend CT of the chest, abdomen and pelvis with contrast for further evaluation 2. Moderate left foraminal stenosis at L3-4 due to leftward disc protrusion and asymmetric facet hypertrophy. 3. Moderate left and mild right foraminal narrowing at L4-5 due to leftward disc protrusion and asymmetric facet hypertrophy. 4. Mild bilateral foraminal narrowing at L5-S1 due to leftward disc protrusion and right asymmetric facet hypertrophy. Electronically signed by: Lonni Necessary MD 03/29/2024 08:38 AM EDT RP Workstation: [id number]    Labs:  CBC: Recent Labs    04/19/23 1558 03/29/24 0919 03/30/24 0531  WBC 9.4 14.3* 12.4*  HGB 14.8 9.7* 9.7*  HCT 45.2 30.7* 31.0*  PLT 307 293 285    COAGS: No results for input(s): INR, APTT in the last 8760 hours.  BMP: Recent Labs    04/19/23 1558 03/29/24 0919 03/30/24 0531 03/30/24 1421  NA 134* 134* 137 133*  K 3.0* 3.8 5.2* 3.7  CL 93* 99 101 97*  CO2 24 22 25 27   GLUCOSE 141* 220* 203* 314*  BUN 12 13 13 13   CALCIUM  9.5 8.8* 9.1 8.7*  CREATININE 0.91 0.50 0.77 0.77  GFRNONAA >60 >60 >60 >60    LIVER FUNCTION TESTS: Recent Labs    04/19/23 1713 03/29/24 1512  BILITOT 0.6 0.9  AST 18 12*  ALT 25 10  ALKPHOS 75 80  PROT 7.8 6.4*  ALBUMIN 3.9 2.6*    TUMOR MARKERS: No results for input(s): AFPTM, CEA, CA199, CHROMGRNA in the last 8760 hours.  Assessment and Plan:  Retroperitoneal mass: Brenda [name] is a 63 y.o. female with a history of endometrial cancer s/p total hysterectomy and bilateral salpingo-oophorectomy recently found to have a new retroperitoneal mass and left pelvic and paraaortic lymphadenopathy concerning  for metastatic disease. She presents to [facility] Interventional Radiology department for an image-guided retroperitoneal biopsy with Dr. ONEIDA Specking. Procedure to be performed under moderate sedation.  -Last dose Prasugrel  7/29 *** -NPO since midnight -Labs reviewed; VSS -Plan for *** biopsy and port  placement with Dr. Vanice   Risks and benefits of peritoneal/retroperitoneal mass biopsy were discussed with the patient and/or patient's family including, but not limited to bleeding, infection, damage to adjacent structures or low yield requiring additional tests.  Risks and benefits of image guided port-a-catheter placement was discussed with the patient including, but not limited to bleeding, infection, pneumothorax, or fibrin sheath development and need for additional procedures.  All of the patient's questions were answered, patient is agreeable to proceed. Consent signed and in chart.    Thank you for this interesting consult. I greatly enjoyed meeting Brenda [name] and look forward to participating in their care. A copy of this report was sent to the requesting provider on this date.  Electronically Signed: Clora Ohmer M Orey Moure, PA-C 04/06/2024, 3:26 PM   I spent a total of {New Out-Pt:[id number]} in face to face clinical consultation, greater than 50% of which was counseling/coordinating care for retroperitoneal mass biopsy.

## 2024-04-06 NOTE — Telephone Encounter (Signed)
 Called Brenda [name] and advised her of the CT chest results per Eleanor Epps, NP:  discussed that there are findings of CAD. (She has a hx of this) Also discussed that there are new nodules seen in the lungs (2 in the right lung, and one in the left) concerning for signs of cancer spread. The goal would be for the chemotherapy to treat these areas as well. Brenda [name] verbalized understanding and agreement.  Also discussed that Dr. Lonn has ordered for a port a cath to be placed at the same time as her biopsy on 04/08/24 so that she will not have to hold her blood thinner twice.  Brenda [name] said this would be fine with her to do both at the same time.

## 2024-04-07 NOTE — Progress Notes (Signed)
 Patient for CT guided Abd Mass Biopsy & IR Port Placement on Wed 04/08/24, I called and spoke with the patient on the phone and gave pre-procedure instructions. Pt was made aware to be here at 10a, last dose of Effient  was on Tues 03/31/24, NPO after MN prior to procedure as well as driver post procedure/recovery/discharge. Pt stated understanding.  Called 04/07/24

## 2024-04-08 ENCOUNTER — Ambulatory Visit
Admission: RE | Admit: 2024-04-08 | Discharge: 2024-04-08 | Disposition: A | Discharge status: Home / Self Care | Source: Ambulatory Visit | Attending: Hematology and Oncology | Admitting: Hematology and Oncology

## 2024-04-08 ENCOUNTER — Other Ambulatory Visit: Payer: Self-pay

## 2024-04-08 VITALS — BP 144/67 | HR 88 | Temp 97.8°F | Resp 12 | Ht 62.0 in | Wt 186.6 lb

## 2024-04-08 DIAGNOSIS — Z794 Long term (current) use of insulin: Secondary | ICD-10-CM

## 2024-04-08 DIAGNOSIS — Z87891 Personal history of nicotine dependence: Secondary | ICD-10-CM | POA: Insufficient documentation

## 2024-04-08 DIAGNOSIS — Z90722 Acquired absence of ovaries, bilateral: Secondary | ICD-10-CM | POA: Diagnosis not present

## 2024-04-08 DIAGNOSIS — C541 Malignant neoplasm of endometrium: Secondary | ICD-10-CM | POA: Insufficient documentation

## 2024-04-08 DIAGNOSIS — Z90710 Acquired absence of both cervix and uterus: Secondary | ICD-10-CM | POA: Diagnosis not present

## 2024-04-08 DIAGNOSIS — Z5986 Financial insecurity: Secondary | ICD-10-CM | POA: Diagnosis not present

## 2024-04-08 DIAGNOSIS — M5442 Lumbago with sciatica, left side: Secondary | ICD-10-CM

## 2024-04-08 HISTORY — PX: IR IMAGING GUIDED PORT INSERTION: IMG5740

## 2024-04-08 LAB — CBC
HCT: 30.1 % — ABNORMAL LOW (ref 36.0–46.0)
Hemoglobin: 10.1 g/dL — ABNORMAL LOW (ref 12.0–15.0)
MCH: 27.5 pg (ref 26.0–34.0)
MCHC: 33.6 g/dL (ref 30.0–36.0)
MCV: 82 fL (ref 80.0–100.0)
Platelets: 275 K/uL (ref 150–400)
RBC: 3.67 MIL/uL — ABNORMAL LOW (ref 3.87–5.11)
RDW: 13.7 % (ref 11.5–15.5)
WBC: 14.5 K/uL — ABNORMAL HIGH (ref 4.0–10.5)
nRBC: 0 % (ref 0.0–0.2)

## 2024-04-08 LAB — GLUCOSE, CAPILLARY
Glucose-Capillary: 422 mg/dL — ABNORMAL HIGH (ref 70–99)
Glucose-Capillary: 425 mg/dL — ABNORMAL HIGH (ref 70–99)
Glucose-Capillary: 317 mg/dL — ABNORMAL HIGH (ref 70–99)

## 2024-04-08 LAB — BASIC METABOLIC PANEL WITH GFR
Anion gap: 12 (ref 5–15)
BUN: 10 mg/dL (ref 6–20)
CO2: 24 mmol/L (ref 22–32)
Calcium: 8.7 mg/dL — ABNORMAL LOW (ref 8.9–10.3)
Chloride: 95 mmol/L — ABNORMAL LOW (ref 98–111)
Creatinine, Ser: 0.92 mg/dL (ref 0.44–1.00)
GFR, Estimated: >60 mL/min (ref >60)
Glucose, Bld: 426 mg/dL — ABNORMAL HIGH (ref 70–99)
Potassium: 3.2 mmol/L — ABNORMAL LOW (ref 3.5–5.1)
Sodium: 131 mmol/L — ABNORMAL LOW (ref 135–145)

## 2024-04-08 LAB — PROTIME-INR
INR: 1.2 (ref 0.8–1.2)
Prothrombin Time: 15.5 s — ABNORMAL HIGH (ref 11.4–15.2)

## 2024-04-08 MED ORDER — MIDAZOLAM HCL 2 MG/2ML IJ SOLN
INTRAMUSCULAR | Status: AC
  Filled 2024-04-08: qty 2

## 2024-04-08 MED ORDER — INSULIN ASPART 100 UNIT/ML IJ SOLN
15.0000 [IU] | Freq: Once | INTRAMUSCULAR | Status: AC
  Administered 2024-04-08: 15 [IU] via SUBCUTANEOUS
  Filled 2024-04-08: qty 0.15

## 2024-04-08 MED ORDER — LIDOCAINE HCL (PF) 1 % IJ SOLN
10.0000 mL | Freq: Once | INTRAMUSCULAR | Status: AC
  Administered 2024-04-08: 10 mL via INTRADERMAL
  Filled 2024-04-08: qty 10

## 2024-04-08 MED ORDER — SODIUM CHLORIDE 0.9 % IV SOLN: INTRAVENOUS | Status: DC

## 2024-04-08 MED ORDER — MIDAZOLAM HCL 2 MG/2ML IJ SOLN
INTRAMUSCULAR | Status: AC | PRN
Start: 2024-04-08 — End: 2024-04-08
  Administered 2024-04-08: 1 mg via INTRAVENOUS

## 2024-04-08 MED ORDER — INSULIN ASPART 100 UNIT/ML IJ SOLN
INTRAMUSCULAR | Status: AC
  Filled 2024-04-08: qty 1

## 2024-04-08 MED ORDER — MIDAZOLAM HCL 5 MG/5ML IJ SOLN
INTRAMUSCULAR | Status: AC | PRN
Start: 2024-04-08 — End: 2024-04-08
  Administered 2024-04-08: 1 mg via INTRAVENOUS

## 2024-04-08 MED ORDER — HEPARIN SOD (PORK) LOCK FLUSH 100 UNIT/ML IV SOLN
500.0000 [IU] | Freq: Once | INTRAVENOUS | Status: AC
  Administered 2024-04-08: 500 [IU] via INTRAVENOUS

## 2024-04-08 MED ORDER — LIDOCAINE-EPINEPHRINE 1 %-1:100000 IJ SOLN
20.0000 mL | Freq: Once | INTRAMUSCULAR | Status: AC
  Administered 2024-04-08: 20 mL via INTRADERMAL

## 2024-04-08 MED ORDER — HEPARIN SOD (PORK) LOCK FLUSH 100 UNIT/ML IV SOLN
INTRAVENOUS | Status: AC
  Filled 2024-04-08: qty 5

## 2024-04-08 MED ORDER — FENTANYL CITRATE (PF) 100 MCG/2ML IJ SOLN
INTRAMUSCULAR | Status: AC | PRN
  Administered 2024-04-08: 50 ug via INTRAVENOUS

## 2024-04-08 MED ORDER — FENTANYL CITRATE (PF) 100 MCG/2ML IJ SOLN
INTRAMUSCULAR | Status: AC
Start: 2024-04-08 — End: 2024-04-08
  Filled 2024-04-08: qty 2

## 2024-04-08 MED ORDER — LIDOCAINE-EPINEPHRINE 1 %-1:100000 IJ SOLN
INTRAMUSCULAR | Status: AC
  Filled 2024-04-08: qty 1

## 2024-04-08 NOTE — Procedures (Signed)
Interventional Radiology Procedure Note  Procedure: RT internal jugular POWER PORT    Complications: None  Estimated Blood Loss:  MIN  Findings: TIP SVCRA    M. TREVOR Levaeh Vice, MD    

## 2024-04-08 NOTE — Procedures (Signed)
 Interventional Radiology Procedure Note  Procedure: CT BX LEFT RP MASS    Complications: None  Estimated Blood Loss:  MIN  Findings: 43 G CORES IN FORMALIN    M. TREVOR Kamali Nephew, MD

## 2024-04-08 NOTE — Progress Notes (Signed)
 Patient clinically stable post CT abdominal mass biopsy followed by IR Port placement per Dr Vanice, tolerated well, vitals stable pre and post procedures. Received total of Fentanyl  100 mcg along with Versed  2 mg for procedures. Report given to Rutha Stacks RN post procedures/specials/24

## 2024-04-09 LAB — SURGICAL PATHOLOGY

## 2024-04-13 ENCOUNTER — Telehealth: Payer: Self-pay | Admitting: Oncology

## 2024-04-13 NOTE — Telephone Encounter (Signed)
 Called Brenda [name] back and advised her that Dr. Lonn is not able to give any recommendations because she hasn't seen her yet.  Advised she can contact her PCP or go the ER for urgent eval if she feels like she needs to.  Brenda [name] verbalized understanding and is aware of her appointment on Thursday with Dr. Lonn.

## 2024-04-13 NOTE — Telephone Encounter (Signed)
 I have never seen her before  I cannot comment on her symptoms and speculate what can cause that Either she sees her PCP or Go to the ER for urgent eval

## 2024-04-13 NOTE — Telephone Encounter (Signed)
 Brenda [name] called and wanted to discuss having more pain in her hips (left more than right) and legs and feeling unsteady when walking. She is having to use a walker because her legs will give out.  She had the pain and unsteadiness when she went in the hospital but said it is worse in the past 2 days and the pain medication is not working as well.  She is having trouble sleeping due to the pain. She is taking Oxycodone  5 mg 2 - 3 times a day along with tylenol  as needed. She also takes Lyrica  and mentioned being worried about taking too much pain medication and becoming addicted.   She is wondering if this is normal because of her cancer. She mentioned that she was more active and even took care of her grandchild before going in the hospital. She knows she has an appointment with Dr. Lonn on Thursday but wanted to check to see if her symptoms are normal due to her cancer.

## 2024-04-14 ENCOUNTER — Ambulatory Visit: Admitting: Adult Health

## 2024-04-14 ENCOUNTER — Encounter: Payer: Self-pay | Admitting: Adult Health

## 2024-04-14 VITALS — BP 130/60 | HR 88 | Ht 62.0 in | Wt 191.8 lb

## 2024-04-14 DIAGNOSIS — Z87891 Personal history of nicotine dependence: Secondary | ICD-10-CM | POA: Diagnosis not present

## 2024-04-14 DIAGNOSIS — G4733 Obstructive sleep apnea (adult) (pediatric): Secondary | ICD-10-CM | POA: Diagnosis not present

## 2024-04-14 NOTE — Patient Instructions (Signed)
 Continue on Oxygen  2l/m  Continue on CPAP At bedtime  with Oxygen  at 2l/m  I will be in touch regarding lung nodule and next step.  Robitussin DM As needed  cough Saline nasal spray Twice daily   Saline nasal gel At bedtime   Follow up with Dr. Theodoro in 3 months and As needed  (30 min slot-to establish)

## 2024-04-14 NOTE — Progress Notes (Signed)
 @Patient  ID: Verneita Macario Gin, female    DOB: 07-21-1961, 63 y.o.   MRN: [id number]  Chief Complaint  Patient presents with   Sleep Apnea    Referring provider: Lonn Hicks, MD  HPI: 63 yo female former smoker(Minimal) followed for OSA/OHS and Chronic Hypoxic/Hypercarbic respiratory failure  Medical history significant for DM, CAD, anemia , endometrial cancer   TEST/EVENTS :  PSG 09/30/22 showed mild OSA, AHI 12/hr with SpO2 low 79%   CT angio chest 10/26/19 >> clear lungs   04/14/2024 Work in visit- Lung nodule  Discussed the use of AI scribe software for clinical note transcription with the patient, who gave verbal consent to proceed.  History of Present Illness Emyah Roznowski is a 63 year old female with history of  endometrial cancer who presents for evaluation of abnormal CT chest . She was referred by oncology for evaluation of lung nodules noted on CT chest .   She has a history of endometrial adenocarcinoma, status post total hysterectomy, BSO, and vaginal brachytherapy completed in July 2023.  She was under surveillance with oncology.  Recently had a workup for severe hip and leg pain.  A CT abdomen and pelvis on March 29, 2024, revealed a large left retroperitoneal mass involving the left psoas muscle, measuring 7.6 by 7.3 cm, with associated mild lymphadenopathy and cortical destruction in the adjacent lumbar spine vertebral bodies. A CT chest showed a new spiculating right upper lobe nodule measuring 9 by 12 mm, along with a new right upper lobe nodule measuring 4 by 6 mm and a left noncalcified nodule measuring 2 by 4 mm. No adenopathy was noted. A biopsy of the retroperitoneal mass April 08, 2024, was positive for metastatic adenocarcinoma -immunohistochemistry consistent with metastasis from a gynecological primary.  She experienced significant lower back and hip pain. Currently on pain meds.  The pain is persistent, with the left leg always hurting. She has  experienced significant weight loss over the last two years, losing over 100 pounds, with 30 pounds lost in the last six months. She normally eats one meal a day and reports some soreness in the stomach area. Does not have much appetite.   She has a history of obstructive sleep apnea/OHS managed with CPAP, which she uses every night. She uses her CPAP every night and wears it consistently. She also has chronic respiratory failure and is on oxygen  at two liters, which she uses continuously, including with her CPAP at night. Feels she is doing well .  CPAP download shows excellent compliance with daily average usage at 15 hours.  She is on auto CPAP 5 to 10 cm H2O.  AHI 1.3/hour.  She reports a new wet cough, mostly in the mornings, which has been ongoing for about a month. No respiratory symptoms like congestion or sinus drainage, and there is no history of smoking except for a brief period in college. No hemoptysis or significant respiratory distress.  No fever or discolored mucus.  Mainly happens when she takes her CPAP off in the morning.      Allergies  Allergen Reactions   Hydrocodone Shortness Of Breath, Dermatitis and Other (See Comments)    I forget to breathe.   Clopidogrel Rash and Dermatitis    Immunization History  Administered Date(s) Administered   Influenza, Seasonal, Injecte, Preservative Fre 05/06/2023   Influenza,inj,Quad PF,6+ Mos 10/27/2019, 06/26/2021   Moderna Sars-Covid-2 Vaccination 01/14/2020, 02/11/2020   Pneumococcal Polysaccharide-23 10/27/2019   Pneumococcal-Unspecified 05/04/2009   Tdap 10/26/2023  Past Medical History:  Diagnosis Date   Anemia    Arthritis    back, hands, hips (02/22/2015)   CAD (coronary artery disease)    a. complex LAD/diagonal bifurcation PCI in 2010. b. STEMI 10/2019 s/p PTCA/DES x1 to mLAD overlapping the old stent, residual disease treated medicaly.   Cancer [facility])    uterine   CHF (congestive heart failure) (HCC)     Depression    Diabetes mellitus (HCC)    started when I was pregnant; not sure if it was type 1 or type 2    History of radiation therapy    Endometrium- HDR 01/22/22-03/07/22- Dr. Lynwood Nasuti   Hypercholesterolemia    Hypertension    Morbid obesity (HCC)    Myocardial infarction (HCC)    mild x 3   Neuromuscular disorder (HCC)    neuropathy feet   Sleep apnea    cpap/    Tobacco History: Social History   Tobacco Use  Smoking Status Former   Current packs/day: 0.00   Average packs/day: 0.5 packs/day for 1 year (0.5 ttl pk-yrs)   Types: Cigarettes, Cigars   Start date: 09/03/1985   Quit date: 09/03/1986   Years since quitting: 37.6  Smokeless Tobacco Never   Counseling given: Not Answered   Outpatient Medications Prior to Visit  Medication Sig Dispense Refill   acetaminophen  (TYLENOL ) 500 MG tablet Take 1-2 tablets (500-1,000 mg total) by mouth every 8 (eight) hours as needed for mild pain (pain score 1-3) or moderate pain (pain score 4-6).     atorvastatin  (LIPITOR ) 80 MG tablet Take 1 tablet (80 mg total) by mouth every evening. 30 tablet 6   buPROPion  (WELLBUTRIN  XL) 150 MG 24 hr tablet Take 150 mg by mouth in the morning.     Cholecalciferol  (VITAMIN D3 SUPER STRENGTH) 50 MCG (2000 UT) CAPS Take 2,000 Units by mouth in the morning.     Continuous Glucose Sensor (FREESTYLE LIBRE 3 SENSOR) MISC Inject 1 Device into the skin every 14 (fourteen) days.     FIASP  FLEXTOUCH 100 UNIT/ML FlexTouch Pen Inject 5-8 Units into the skin See admin instructions. Inject 5 units into the skin before breakfast & lunch and 8 units before supper/evening meal     FLUoxetine  (PROZAC ) 40 MG capsule Take 40 mg by mouth daily.     furosemide  (LASIX ) 40 MG tablet Take 1 tablet by mouth once daily 30 tablet 0   insulin  degludec (TRESIBA) 100 UNIT/ML FlexTouch Pen Inject 20 Units into the skin every evening.     losartan  (COZAAR ) 25 MG tablet Take 1 tablet (25 mg total) by mouth daily. 90 tablet 3    metFORMIN (GLUCOPHAGE-XR) 500 MG 24 hr tablet Take 1,000 mg by mouth in the morning and at bedtime.     methocarbamol  (ROBAXIN ) 500 MG tablet Take 1 tablet (500 mg total) by mouth every 8 (eight) hours as needed for muscle spasms. 10 tablet 0   Misc. Devices MISC Portable oxygen  concentrator, 2L oxygen .  Diagnoses-chronic respiratory failure with hypoxia 1 each 0   nitroGLYCERIN  (NITROSTAT ) 0.4 MG SL tablet Dissolve 1 tablet under the tongue every 5 minutes as needed for chest pain. Max of 3 doses, then 911. 25 tablet 6   oxyCODONE  (OXY IR/ROXICODONE ) 5 MG immediate release tablet Take 1 tablet (5 mg total) by mouth every 8 (eight) hours as needed for severe pain (pain score 7-10). 10 tablet 0   OXYGEN  Inhale 2 L/min into the lungs continuous.  polyethylene glycol powder (GLYCOLAX /MIRALAX ) 17 GM/SCOOP powder Take 17 g by mouth daily as needed for mild constipation.     prasugrel  (EFFIENT ) 5 MG TABS tablet Take 1 tablet (5 mg total) by mouth in the morning. AFTER TAKING YOUR DOSE ON 04/07/2024, STOP TAKING IT IN PREPARATION FOR THE BIOPSY ON 04/15/2024. AFTER THE BIOPSY, FOLLOW THE RADIOLOGIST RECOMMENDATIONS AS TO TIMING OF RESTARTING THIS MEDICINE.     pregabalin  (LYRICA ) 100 MG capsule Take 1 capsule (100 mg total) by mouth 3 (three) times daily. 90 capsule 6   Respiratory Therapy Supplies (CARETOUCH 2 CPAP HOSE HANGER) MISC daily at night     prednisoLONE  acetate (PRED FORTE ) 1 % ophthalmic suspension Place 1 drop into the left eye See admin instructions. INSTILL 1 DROP INTO LEFT EYE 4 TIMES DAILY FOR 1 WEEK, 3 TIMES DAILY FOR 1 WEEK, TWICE DAILY FOR 1 WEEK, ONCE DAILY FOR 1 WEEK, THEN STOP (Patient not taking: Reported on 04/14/2024)     WEGOVY  0.25 MG/0.5ML SOAJ Inject 0.25 mg into the skin every Wednesday. (Patient not taking: Reported on 04/14/2024)     No facility-administered medications prior to visit.     Review of Systems:   Constitutional:   No  weight loss, night sweats,  Fevers,  chills, fatigue, or  lassitude.  HEENT:   No headaches,  Difficulty swallowing,  Tooth/dental problems, or  Sore throat,                No sneezing, itching, ear ache, nasal congestion, post nasal drip,   CV:  No chest pain,  Orthopnea, PND, swelling in lower extremities, anasarca, dizziness, palpitations, syncope.   GI  No heartburn, indigestion, abdominal pain, nausea, vomiting, diarrhea, change in bowel habits, loss of appetite, bloody stools.   Resp: No shortness of breath with exertion or at rest.  No excess mucus, no productive cough,  No non-productive cough,  No coughing up of blood.  No change in color of mucus.  No wheezing.  No chest wall deformity  Skin: no rash or lesions.  GU: no dysuria, change in color of urine, no urgency or frequency.  No flank pain, no hematuria   MS:  hip and leg pain     Physical Exam  BP 130/60   Pulse 88   Ht 5' 2 (1.575 m)   Wt 191 lb 12.8 oz (87 kg)   LMP 10/30/2014 (Approximate)   SpO2 96% Comment: 2L  BMI 35.08 kg/m   GEN: A/Ox3; pleasant , NAD, well nourished , on O2    HEENT:  [place]/AT, NOSE-clear, THROAT-clear, no lesions, no postnasal drip or exudate noted.   NECK:  Supple w/ fair ROM; no JVD; normal carotid impulses w/o bruits; no thyromegaly or nodules palpated; no lymphadenopathy.    RESP  Clear  P & A; w/o, wheezes/ rales/ or rhonchi. no accessory muscle use, no dullness to percussion  CARD:  RRR, no m/r/g, no peripheral edema, pulses intact, no cyanosis or clubbing.  GI:   Soft & nt; nml bowel sounds; no organomegaly or masses detected.   Musco: Warm bil, no deformities or joint swelling noted.   Neuro: alert, no focal deficits noted.    Skin: Warm, no lesions or rashes    Lab Results:  CBC  BNP    Component Value Date/Time   BNP 24.2 04/19/2023 1713    ProBNP    Component Value Date/Time   PROBNP 103 10/04/2021 0839   PROBNP 68.0 01/17/2011 1847  Imaging: IR IMAGING GUIDED PORT INSERTION Result  Date: 04/08/2024 CLINICAL DATA:  Recurrent metastatic uterine carcinoma EXAM: RIGHT INTERNAL JUGULAR SINGLE LUMEN POWER PORT CATHETER INSERTION Date:  04/08/2024 04/08/2024 1:24 pm Radiologist:  M. Frederic Specking, MD Guidance:  Ultrasound and fluoroscopic MEDICATIONS: 1% lidocaine  local with epinephrine  ANESTHESIA/SEDATION: Versed  1.0 mg IV; Fentanyl  50 mcg IV; Moderate Sedation Time:  26 minutes The patient was continuously monitored during the procedure by the interventional radiology nurse under my direct supervision. FLUOROSCOPY: 0 minutes, 48 seconds (6 mGy) COMPLICATIONS: None immediate. CONTRAST:  None. PROCEDURE: Informed consent was obtained from the patient following explanation of the procedure, risks, benefits and alternatives. The patient understands, agrees and consents for the procedure. All questions were addressed. A time out was performed. Maximal barrier sterile technique utilized including caps, mask, sterile gowns, sterile gloves, large sterile drape, hand hygiene, and 2% chlorhexidine  scrub. Under sterile conditions and local anesthesia, right internal jugular micropuncture venous access was performed. Access was performed with ultrasound. Images were obtained for documentation of the patent right internal jugular vein. A guide wire was inserted followed by a transitional dilator. This allowed insertion of a guide wire and catheter into the IVC. Measurements were obtained from the SVC / RA junction back to the right IJ venotomy site. In the right infraclavicular chest, a subcutaneous pocket was created over the second anterior rib. This was done under sterile conditions and local anesthesia. 1% lidocaine  with epinephrine  was utilized for this. A 2.5 cm incision was made in the skin. Blunt dissection was performed to create a subcutaneous pocket over the right pectoralis major muscle. The pocket was flushed with saline vigorously. There was adequate hemostasis. The port catheter was assembled and  checked for leakage. The port catheter was secured in the pocket with two retention sutures. The tubing was tunneled subcutaneously to the right venotomy site and inserted into the SVC/RA junction through a valved peel-away sheath. Position was confirmed with fluoroscopy. Images were obtained for documentation. The patient tolerated the procedure well. No immediate complications. Incisions were closed in a two layer fashion with 4 - 0 Vicryl suture. Dermabond was applied to the skin. The port catheter was accessed, blood was aspirated followed by saline and heparin  flushes. Needle was removed. A dry sterile dressing was applied. IMPRESSION: Ultrasound and fluoroscopically guided right internal jugular single lumen power port catheter insertion. Tip in the SVC/RA junction. Catheter ready for use. Electronically Signed   By: CHRISTELLA.  Shick M.D.   On: 04/08/2024 14:26   CT ABDOMINAL MASS BIOPSY Result Date: 04/08/2024 INDICATION: LEFT RETROPERITONEAL METASTATIC NODAL MASS EXAM: CT-GUIDED BIOPSY LEFT RETROPERITONEAL NODAL MASS MEDICATIONS: 1% LIDOCAINE  LOCAL ANESTHESIA/SEDATION: 1.0 mg IV Versed ; 50 mcg IV Fentanyl  Moderate Sedation Time:  12 MINUTE The patient was continuously monitored during the procedure by the interventional radiology nurse under my direct supervision. PROCEDURE: The procedure, risks, benefits, and alternatives were explained to the patient. Questions regarding the procedure were encouraged and answered. The patient understands and consents to the procedure. Previous imaging reviewed. Patient positioned prone. Noncontrast localization CT performed. The left retroperitoneal large nodal mass with central necrosis was localized and marked. Under sterile conditions and local anesthesia, the 17 gauge guide needle was advanced to the peripheral margin of the lesion laterally. Needle position confirmed with CT. 18 gauge core biopsies obtained. These were placed in formalin. Needle removed. Images obtained  for documentation. Patient tolerated the procedure well without complication. Vital sign monitoring by nursing staff during the procedure will continue as  patient is in the special procedures unit for post procedure observation. FINDINGS: The images document guide needle placement within the large left retroperitoneal nodal mass. Post biopsy images demonstrate no hemorrhage or hematoma. COMPLICATIONS: None immediate. IMPRESSION: Successful CT-guided core biopsy of the left retroperitoneal nodal mass RADIATION DOSE REDUCTION: This exam was performed according to the departmental dose-optimization program which includes automated exposure control, adjustment of the mA and/or kV according to patient size and/or use of iterative reconstruction technique. Electronically Signed   By: CHRISTELLA.  Shick M.D.   On: 04/08/2024 14:25   CT Chest W Contrast Result Date: 04/03/2024 CLINICAL DATA:  Genital cancer, staging Per Dr. Eldonna, new large retroperitoneal mass with lymphadenopathy concerning for recurrence, hx of endometrial cancer s/p surgery and radiation. * Tracking Code: BO * EXAM: CT CHEST WITH CONTRAST TECHNIQUE: Multidetector CT imaging of the chest was performed during intravenous contrast administration. RADIATION DOSE REDUCTION: This exam was performed according to the departmental dose-optimization program which includes automated exposure control, adjustment of the mA and/or kV according to patient size and/or use of iterative reconstruction technique. CONTRAST:  75mL OMNIPAQUE  IOHEXOL  300 MG/ML  SOLN COMPARISON:  CT Angiography chest from 10/26/2019. FINDINGS: Cardiovascular: Normal cardiac size. No pericardial effusion. No aortic aneurysm. There are coronary artery calcifications, in keeping with coronary artery disease. There are also mild peripheral atherosclerotic vascular calcifications of thoracic aorta and its major branches. Mitral annulus calcifications noted. Mediastinum/Nodes: Visualized thyroid  gland  appears grossly unremarkable. No solid / cystic mediastinal masses. The esophagus is nondistended precluding optimal assessment. There is mild circumferential thickening of the lower thoracic esophagus, which is most likely seen in the settings of chronic gastroesophageal reflux disease versus esophagitis. No axillary, mediastinal or hilar lymphadenopathy by size criteria. Lungs/Pleura: The central tracheo-bronchial tree is patent. There are patchy areas of linear, plate-like atelectasis and/or scarring throughout bilateral lungs. There is a new spiculated marginated 9 x 12 mm nodule in the posterior segment of right upper lobe, highly concerning for metastases. There is an additional, part solid nodule in the right upper lobe (series 5, image 49), which measures 4 x 6 mm. There is a 2 x 4 mm noncalcified nodule in the left lung apex (series 5, image 33), which is indeterminate. No lung mass, consolidation, pleural effusion or pneumothorax. Upper Abdomen: There is dilation of extrahepatic bile duct and central intrahepatic bile ducts. Please refer to recent CT scan abdomen and pelvis report for details. Remaining visualized upper abdominal viscera within normal limits. Musculoskeletal: The visualized soft tissues of the chest wall are grossly unremarkable. No suspicious osseous lesions. There are minimal multilevel degenerative changes in the visualized spine. IMPRESSION: 1. There is a new spiculated marginated 9 x 12 mm nodule in the posterior segment of right upper lobe, highly concerning for metastases. There is an additional, part solid nodule in the right upper lobe , which measures 4 x 6 mm. 2. No lung mass, consolidation, pleural effusion or pneumothorax. 3. No mediastinal, hilar or axillary lymphadenopathy by size criteria. 4. Multiple other nonacute observations, as described above. Aortic Atherosclerosis (ICD10-I70.0). Electronically Signed   By: Ree Molt M.D.   On: 04/03/2024 17:23   DG Lumbar  Spine Complete Result Date: 04/01/2024 CLINICAL DATA:  Low back pain EXAM: LUMBAR SPINE - COMPLETE 4+ VIEW COMPARISON:  None Available. FINDINGS: There is no evidence of lumbar spine fracture. Alignment is normal. Intervertebral disc spaces are maintained. There mild degenerative endplate osteophytes throughout the lumbar spine. Air-fluid level in the pelvis may  be within the bladder. IMPRESSION: 1. Mild degenerative changes of the lumbar spine. 2. Air-fluid level in the pelvis may be within the bladder. Electronically Signed   By: Greig Pique M.D.   On: 04/01/2024 22:45   DG FEMUR MIN 2 VIEWS LEFT Result Date: 04/01/2024 CLINICAL DATA:  Low back pain EXAM: LEFT FEMUR 2 VIEWS COMPARISON:  None Available. FINDINGS: There is no evidence of fracture or other focal bone lesions. Peripheral vascular calcifications are present. Joint spaces are well maintained. IMPRESSION: 1. No acute fracture or dislocation. 2. Peripheral vascular calcifications. Electronically Signed   By: Greig Pique M.D.   On: 04/01/2024 22:44   CT ABDOMEN PELVIS W CONTRAST Result Date: 03/29/2024 CLINICAL DATA:  Left pelvic and back pain. Uterine carcinoma. * Tracking Code: BO * EXAM: CT ABDOMEN AND PELVIS WITH CONTRAST TECHNIQUE: Multidetector CT imaging of the abdomen and pelvis was performed using the standard protocol following bolus administration of intravenous contrast. RADIATION DOSE REDUCTION: This exam was performed according to the departmental dose-optimization program which includes automated exposure control, adjustment of the mA and/or kV according to patient size and/or use of iterative reconstruction technique. CONTRAST:  OMNIPAQUE  IOHEXOL  300 MG/ML  SOLN COMPARISON:  12/08/2021 FINDINGS: Lower Chest: No acute findings. Hepatobiliary: No suspicious hepatic masses identified. Prior cholecystectomy. Stable diffuse biliary ductal dilatation, likely due to post cholecystectomy effect. Pancreas:  No mass or inflammatory  changes. Spleen: Within normal limits in size and appearance. Adrenals/Urinary Tract: No suspicious masses identified. No evidence of ureteral calculi or hydronephrosis. Gas seen in urinary bladder, presumably from recent instrumentation. Stomach/Bowel: Tiny hiatal hernia again noted. No evidence of obstruction, inflammatory process or abnormal fluid collections. Vascular/Lymphatic: Large left retroperitoneal mass is seen involving the left psoas muscle and left paraaortic region with mild cortical destruction seen involving the adjacent lumbar vertebral bodies. This is new in measures 7.6 x 7.3 cm, consistent with metastatic disease. Mild lymphadenopathy is also seen elsewhere in the left paraaortic region and left iliac chains, with largest measuring 1.8 cm in the left external iliac chain. No acute vascular findings. Reproductive:  Prior hysterectomy. Other:  Small fat-containing umbilical hernia. Musculoskeletal:  No suspicious bone lesions identified. IMPRESSION: New large left retroperitoneal mass involving the left psoas muscle and left paraaortic region with cortical destruction in the adjacent lumbar spine vertebral bodies, consistent with metastatic disease. Mild left paraaortic and left iliac lymphadenopathy, consistent with metastatic disease. Electronically Signed   By: Norleen DELENA Kil M.D.   On: 03/29/2024 11:37   CT PELVIS WO CONTRAST Result Date: 03/29/2024 CLINICAL DATA:  Left leg and lower back pain. History of uterine cancer. EXAM: CT PELVIS WITHOUT CONTRAST TECHNIQUE: Multidetector CT imaging of the pelvis was performed following the standard protocol without intravenous contrast. RADIATION DOSE REDUCTION: This exam was performed according to the departmental dose-optimization program which includes automated exposure control, adjustment of the mA and/or kV according to patient size and/or use of iterative reconstruction technique. COMPARISON:  12/08/2021 FINDINGS: Urinary Tract:  Gas is  identified in the urinary bladder. Bowel:  Unremarkable visualized pelvic bowel loops. Vascular/Lymphatic: Atherosclerotic calcification noted in the common iliac arteries bilaterally. r 2.9 x 1.4 cm soft tissue nodule is seen along the course of the left external iliac artery and vein (52/6) with adjacent probable 12 mm short axis left pelvic sidewall lymph node on 62/6. Reproductive:  Uterus surgically absent. Other:  No intraperitoneal free fluid. Musculoskeletal: No worrisome lytic or sclerotic osseous abnormality. IMPRESSION: 1. New 2.9 x 1.4  cm soft tissue nodule along the course of the left external iliac artery and vein with adjacent probable 12 mm short axis left pelvic sidewall lymph node. By report, the patient is status post total abdominal hysterectomy with bilateral salpingo oophorectomy on 12/06/2021. As such, this soft tissue nodularity along the left pelvic sidewall is unlikely to represent ovary. In the setting of a uterine cancer diagnosis, metastatic disease is a distinct concern. Dedicated CT abdomen/pelvis with oral and intravenous contrast recommended to further evaluate. 2. Gas in the urinary bladder. In the absence of recent instrumentation, bladder infection would be a primary consideration. 3.  Aortic Atherosclerosis (ICD10-I70.0). Electronically Signed   By: Camellia Candle M.D.   On: 03/29/2024 09:16   CT Lumbar Spine Wo Contrast Result Date: 03/29/2024 EXAM: CT OF THE LUMBAR SPINE WITHOUT CONTRAST 03/29/2024 08:11:43 AM TECHNIQUE: CT of the lumbar spine was performed without the administration of intravenous contrast. Multiplanar reformatted images are provided for review. Automated exposure control, iterative reconstruction, and/or weight based adjustment of the mA/kV was utilized to reduce the radiation dose to as low as reasonably achievable. COMPARISON: None available. CLINICAL HISTORY: Lumbar radiculopathy, cancer suspected. Per triage notes: Patient coming to ED for evaluation  of L leg pain and lower back pain. Reports pain started 2 months ago. Has worsened over the last several days. Was seen by PCP on Thursday and was instructed to follow up with neurologist. Unable to sleep this weekend due to pain. No reports of injuries or new weakness. FINDINGS: BONES AND ALIGNMENT: Mild rightward curvature is present in the lumbar spine. No significant listhesis is present. DEGENERATIVE CHANGES: A leftward disc protrusion and asymmetric facet hypertrophy contribute to moderate left foraminal stenosis at L3-4. A leftward disc protrusion and asymmetric facet hypertrophy contribute to moderate left and mild right foraminal narrowing at L4-5. A leftward disc protrusion is present at L5 to S1. Facet hypertrophy is asymmetric on the right. Mild foraminal narrowing is present bilaterally. SOFT TISSUES: A retroperitoneal left paraspinous soft tissue mass measures 7.5 x 5.5 x 8.5 cm, centered at the L2-3 level. The tumor encroaches on the lateral margin of the left foramen at L2-3. IMPRESSION: 1. Retroperitoneal left paraspinous soft tissue mass measuring 7.5 x 5.5 x 8.5 cm, centered at the L2-3 level, encroaching on the lateral margin of the left foramen at L2-3. This is most concerning for metastatic disease. Primary sarcoma could have a similar appearance. Recommend CT of the chest, abdomen and pelvis with contrast for further evaluation 2. Moderate left foraminal stenosis at L3-4 due to leftward disc protrusion and asymmetric facet hypertrophy. 3. Moderate left and mild right foraminal narrowing at L4-5 due to leftward disc protrusion and asymmetric facet hypertrophy. 4. Mild bilateral foraminal narrowing at L5-S1 due to leftward disc protrusion and right asymmetric facet hypertrophy. Electronically signed by: Lonni Necessary MD 03/29/2024 08:38 AM EDT RP Workstation: [id number]    Administration History     None           No data to display          No results found for:  NITRICOXIDE      Assessment & Plan:   No problem-specific Assessment & Plan notes found for this encounter.  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 Biopsy of the abdominal mass confirms  metastatic adenocarcinoma consistent with a gynecological primary. Coordinate with oncology for management.  Discussed case with oncology patient most likely to proceed with chemo and ongoing surveillance. Will hold off on tissue sampling of lung nodule at this time. Continue ongoing surveillance imaging as she undergoes active therapy.   Chronic respiratory failure on home oxygen    Chronic respiratory failure is managed with home oxygen  at 2 liters. Continue home oxygen  at this setting.  Obstructive sleep apnea/OHS doing well on CPAP   Obstructive sleep apnea is managed with CPAP. CPAP pressures and usage are optimal, with minor mask leaks noted but not affecting treatment efficacy. Continue CPAP therapy at current settings.  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 Discuss pain management and further oncological treatment with oncology.  Unintentional weight loss   She has experienced significant unintentional weight loss over the last two years, with a 30-pound loss in the last six months. This is likely multifactorial, related to cancer and decreased appetite. Monitor weight and nutritional status.  Chronic cough   She has a chronic wet cough, primarily in the mornings, persisting for about a month. There is no evidence of pneumonia on a recent CT scan. The cough is likely related to postnasal drip or CPAP use. Recommend over-the-counter cough  medications such as Delsym or Robitussin DM. Use saline nasal spray and gel to alleviate postnasal drip. Hold antibiotics unless symptoms worsen or change.    I spent  42  minutes dedicated to the care of this patient on the date of this encounter to include pre-visit review of records, face-to-face time with the patient discussing conditions above, post visit ordering of testing, clinical documentation with the electronic health record, making appropriate referrals as documented, and communicating necessary findings to members of the patients care team.   Madelin Stank, NP 04/14/2024

## 2024-04-16 ENCOUNTER — Encounter: Payer: Self-pay | Admitting: Hematology and Oncology

## 2024-04-16 ENCOUNTER — Other Ambulatory Visit (HOSPITAL_COMMUNITY): Payer: Self-pay

## 2024-04-16 ENCOUNTER — Inpatient Hospital Stay

## 2024-04-16 ENCOUNTER — Inpatient Hospital Stay: Attending: Hematology and Oncology | Admitting: Hematology and Oncology

## 2024-04-16 VITALS — BP 104/54 | HR 97 | Temp 97.4°F | Resp 18 | Ht 62.0 in | Wt 193.8 lb

## 2024-04-16 DIAGNOSIS — C541 Malignant neoplasm of endometrium: Secondary | ICD-10-CM | POA: Diagnosis not present

## 2024-04-16 DIAGNOSIS — Z5111 Encounter for antineoplastic chemotherapy: Secondary | ICD-10-CM | POA: Insufficient documentation

## 2024-04-16 DIAGNOSIS — Z5112 Encounter for antineoplastic immunotherapy: Secondary | ICD-10-CM | POA: Insufficient documentation

## 2024-04-16 DIAGNOSIS — Z87891 Personal history of nicotine dependence: Secondary | ICD-10-CM | POA: Diagnosis not present

## 2024-04-16 DIAGNOSIS — K5909 Other constipation: Secondary | ICD-10-CM

## 2024-04-16 DIAGNOSIS — G893 Neoplasm related pain (acute) (chronic): Secondary | ICD-10-CM | POA: Insufficient documentation

## 2024-04-16 DIAGNOSIS — Z79620 Long term (current) use of immunosuppressive biologic: Secondary | ICD-10-CM | POA: Insufficient documentation

## 2024-04-16 DIAGNOSIS — Z7189 Other specified counseling: Secondary | ICD-10-CM

## 2024-04-16 DIAGNOSIS — Z794 Long term (current) use of insulin: Secondary | ICD-10-CM

## 2024-04-16 DIAGNOSIS — E118 Type 2 diabetes mellitus with unspecified complications: Secondary | ICD-10-CM | POA: Diagnosis not present

## 2024-04-16 MED ORDER — PROCHLORPERAZINE MALEATE 10 MG PO TABS
10.0000 mg | ORAL_TABLET | Freq: Four times a day (QID) | ORAL | 1 refills | Status: DC | PRN
Start: 2024-04-16 — End: 2024-06-03
  Filled 2024-04-16: qty 30, 8d supply, fill #0

## 2024-04-16 MED ORDER — OXYCODONE HCL 10 MG PO TABS
10.0000 mg | ORAL_TABLET | ORAL | 0 refills | Status: DC | PRN
  Filled 2024-04-16: qty 60, 10d supply, fill #0

## 2024-04-16 MED ORDER — LIDOCAINE-PRILOCAINE 2.5-2.5 % EX CREA
TOPICAL_CREAM | CUTANEOUS | 3 refills | Status: DC
  Filled 2024-04-16: qty 30, 30d supply, fill #0

## 2024-04-16 MED ORDER — ONDANSETRON HCL 8 MG PO TABS
8.0000 mg | ORAL_TABLET | Freq: Three times a day (TID) | ORAL | 1 refills | Status: DC | PRN
Start: 2024-04-16 — End: 2024-06-11
  Filled 2024-04-16: qty 30, 10d supply, fill #0

## 2024-04-16 MED ORDER — MORPHINE SULFATE ER 15 MG PO TBCR
15.0000 mg | EXTENDED_RELEASE_TABLET | Freq: Every day | ORAL | 0 refills | Status: DC
  Filled 2024-04-16: qty 30, 30d supply, fill #0

## 2024-04-16 NOTE — Assessment & Plan Note (Signed)
 I recommend daily laxatives while on pain medicine

## 2024-04-16 NOTE — Progress Notes (Signed)
 [facility] CONSULT NOTE  Patient Care Team: Lonn Hicks, MD as PCP - General (Hematology and Oncology) Wonda Sharper, MD as PCP - Cardiology (Cardiology) Lonn Hicks, MD as Consulting Physician (Hematology and Oncology)  ASSESSMENT & PLAN:  Endometrial adenocarcinoma [facility]) The patient was originally diagnosed with uterine cancer in 2023, status post surgery and adjuvant radiation treatment She was noted to have cancer recurrence in July 2025 when she presented with severe back pain with CT imaging showing recurrent disease in the retroperitoneal lymph node.  CT imaging of the chest shows spiculated lung mass suspicious for metastatic spread Pathology: Left retroperitoneal mass biopsy confirmed adenocarcinoma, ER positive, p53 wild-type.  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 I recommend increasing her pain medicine to 10 mg of oxycodone  to take as needed along with acetaminophen  as needed I also will add long-acting MS Contin  daily for better pain control at night I warned her about risk of sedation and constipation I will see her again next week to assess pain control We discussed narcotic refill policy I have discontinued her muscle relaxant  Type 2 diabetes mellitus with complication, with long-term current use of insulin  (HCC) She has poorly controlled diabetes with severe  peripheral neuropathy I recommend cryotherapy during treatment We discussed risk of uncontrolled hyperglycemia with treatment due to exposure to corticosteroids We discussed importance of dietary modification while on treatment  Goals of care, counseling/discussion Counseled and coordinated advanced care planning today.  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 Treatment offered is palliative in nature. We discussed a realistic and effective plan that will be reviewed and updated on an ongoing basis as indicated.   Other constipation I recommend daily laxatives while on pain medicine   Orders Placed This Encounter  Procedures   CBC with Differential (Cancer Center Only)    Standing Status:   Future    Expected Date:   04/24/2024    Expiration Date:   04/24/2025   CMP (Cancer Center only)    Standing Status:   Future    Expected Date:   04/24/2024    Expiration Date:   04/24/2025   T4    Standing Status:   Future    Expected Date:   04/24/2024    Expiration Date:   04/24/2025   TSH    Standing Status:   Future    Expected Date:   04/24/2024    Expiration Date:   04/24/2025   CBC with Differential (Cancer Center Only)    Standing Status:   Future    Expected Date:   05/15/2024    Expiration Date:   05/15/2025   CMP (Cancer Center only)    Standing Status:   Future    Expected Date:   05/15/2024    Expiration Date:   05/15/2025   CBC with Differential (Cancer Center Only)    Standing Status:   Future    Expected Date:  06/05/2024    Expiration Date:   06/05/2025   CMP (Cancer Center only)    Standing Status:   Future    Expected Date:   06/05/2024    Expiration Date:   06/05/2025   T4    Standing Status:   Future    Expected Date:   06/05/2024    Expiration Date:   06/05/2025   TSH    Standing Status:   Future    Expected Date:   06/05/2024    Expiration Date:   06/05/2025   CBC with Differential (Cancer Center Only)    Standing Status:   Future     Expected Date:   06/26/2024    Expiration Date:   06/26/2025   CMP (Cancer Center only)    Standing Status:   Future    Expected Date:   06/26/2024    Expiration Date:   06/26/2025   CBC with Differential (Cancer Center Only)    Standing Status:   Future    Expected Date:   07/17/2024    Expiration Date:   07/17/2025   CMP (Cancer Center only)    Standing Status:   Future    Expected Date:   07/17/2024    Expiration Date:   07/17/2025   CBC with Differential (Cancer Center Only)    Standing Status:   Future    Expected Date:   08/07/2024    Expiration Date:   08/07/2025   CMP (Cancer Center only)    Standing Status:   Future    Expected Date:   08/07/2024    Expiration Date:   08/07/2025   T4    Standing Status:   Future    Expected Date:   08/07/2024    Expiration Date:   08/07/2025   TSH    Standing Status:   Future    Expected Date:   08/07/2024    Expiration Date:   08/07/2025   CBC with Differential (Cancer Center Only)    Standing Status:   Future    Expected Date:   08/28/2024    Expiration Date:   08/28/2025   CMP (Cancer Center only)    Standing Status:   Future    Expected Date:   08/28/2024    Expiration Date:   08/28/2025   T4    Standing Status:   Future    Expected Date:   08/28/2024    Expiration Date:   08/28/2025   TSH    Standing Status:   Future    Expected Date:   08/28/2024    Expiration Date:   08/28/2025   CBC with Differential (Cancer Center Only)    Standing Status:   Future    Expected Date:   10/09/2024    Expiration Date:   10/09/2025   CMP (Cancer Center only)    Standing Status:   Future    Expected Date:   10/09/2024    Expiration Date:   10/09/2025   T4    Standing Status:   Future    Expected Date:   10/09/2024    Expiration Date:   10/09/2025   TSH    Standing Status:   Future    Expected Date:   10/09/2024    Expiration Date:   10/09/2025   CBC with Differential (Cancer Center Only)    Standing Status:   Future    Expected Date:   11/20/2024     Expiration Date:   11/20/2025  CMP (Cancer Center only)    Standing Status:   Future    Expected Date:   11/20/2024    Expiration Date:   11/20/2025   T4    Standing Status:   Future    Expected Date:   11/20/2024    Expiration Date:   11/20/2025   TSH    Standing Status:   Future    Expected Date:   11/20/2024    Expiration Date:   11/20/2025   CBC with Differential (Cancer Center Only)    Standing Status:   Future    Expected Date:   01/01/2025    Expiration Date:   01/01/2026   CMP (Cancer Center only)    Standing Status:   Future    Expected Date:   01/01/2025    Expiration Date:   01/01/2026   T4    Standing Status:   Future    Expected Date:   01/01/2025    Expiration Date:   01/01/2026   TSH    Standing Status:   Future    Expected Date:   01/01/2025    Expiration Date:   01/01/2026   CBC with Differential (Cancer Center Only)    Standing Status:   Future    Expected Date:   02/12/2025    Expiration Date:   02/12/2026   CMP (Cancer Center only)    Standing Status:   Future    Expected Date:   02/12/2025    Expiration Date:   02/12/2026   T4    Standing Status:   Future    Expected Date:   02/12/2025    Expiration Date:   02/12/2026   TSH    Standing Status:   Future    Expected Date:   02/12/2025    Expiration Date:   02/12/2026   CBC with Differential (Cancer Center Only)    Standing Status:   Future    Expected Date:   03/26/2025    Expiration Date:   03/26/2026   CMP (Cancer Center only)    Standing Status:   Future    Expected Date:   03/26/2025    Expiration Date:   03/26/2026   T4    Standing Status:   Future    Expected Date:   03/26/2025    Expiration Date:   03/26/2026   TSH    Standing Status:   Future    Expected Date:   03/26/2025    Expiration Date:   03/26/2026    The total time spent in the appointment was 90 minutes encounter with patients including review of chart and various tests results, discussions about plan of care and coordination of care plan   All  questions were answered. The patient knows to call the clinic with any problems, questions or concerns. No barriers to learning was detected.  Almarie Bedford, MD 8/14/20252:34 PM  CHIEF COMPLAINTS/PURPOSE OF CONSULTATION:  Recurrent uterine cancer, MSI high disease  HISTORY OF PRESENTING ILLNESS:  Brenda [name] 63 y.o. female is here because of recurrent uterine cancer She is here accompanied by her daughter The patient is currently not working, disabled, living with her nephew She was originally diagnosed with uterine cancer in 2023, early stage disease, treated with surgery and received adjuvant radiation treatment She was recently hospitalized with severe uncontrolled back pain and CT imaging revealed recurrent disease in metastatic fashion, biopsy-proven to be recurrent cancer  She has completed port placement Her pain is suboptimally controlled; she  rated her pain at 6 out of 10 pain, at times could be as high as 10 out of 10 pain.  She has woken up in the middle of the night with uncontrolled pain She has mild constipation  I have reviewed her chart and materials related to her cancer extensively and collaborated history with the patient. Summary of oncologic history is as follows: Oncology History Overview Note  MMR IHC: loss of MLH1 and PMS2 MSI - high MLH1 hyper methylation present  Tumor size 6 cm   Endometrial adenocarcinoma (HCC)  10/26/2021 Initial Biopsy   EMB: grade 1 endometrioid adenocarcinoma   11/21/2021 Imaging   CT A/P: 1. No evidence for metastatic disease in the abdomen or pelvis. 2. Endometrium appears thickened, but not well demonstrated by CT. Endometrial stripe appears to be 4.7 cm in thickness on sagittal CT imaging today. 3. Complex paraumbilical hernia contains only fat. 4. Skin thickening with soft tissue stranding noted anterior lower abdominal wall. This could be related to cellulitis and should be amenable to clinical inspection. 5. Advanced  coronary artery calcification in the left coronary distribution.   12/06/2021 Initial Diagnosis   Endometrial adenocarcinoma (HCC)   12/06/2021 Surgery   Robotic-assisted laparoscopic total hysterectomy with bilateral salpingoophorectomy, laparoscopic hernia reduction Findings: On EUA, 12 cm mobile uterus.  On intra-abdominal entry, normal appearing upper abdominal survey although right liver edge and diaphragm obscured secondary to adhesions from cholecystectomy.  Normal-appearing omentum with portion of the omentum within a 3 cm umbilical hernia (reduced).  Normal-appearing small and large bowel.  Normal-appearing bilateral fallopian tubes and ovaries.  Uterus 12 cm in quite bulbous.  No obvious adenopathy.  No ascites.  No clear evidence of intra-abdominal or pelvic disease.   12/13/2021 Pathology Results   A. UTERUS, CERVIX, TUBES AND OVARIES:  - Endometrial adenocarcinoma with foci of squamous differentiation, FIGO  grade 1  - Carcinoma invades for a depth of 1.6 cm where myometrial thickness is  2.8 cm ( 60%)  - Carcinoma does not involve the cervix  - Benign bilateral fallopian tubes and ovaries  - See oncology table   ONCOLOGY TABLE:   UTERUS, CARCINOMA OR CARCINOSARCOMA: Resection   Procedure: Total hysterectomy and bilateral salpingo-oophorectomy  Histologic Type: Endometrioid carcinoma with foci of squamous  differentiation  Histologic Grade: FIGO grade 1  Myometrial Invasion:       Depth of Myometrial Invasion (mm): 16 mm       Myometrial Thickness (mm): 28 mm       Percentage of Myometrial Invasion: 57%  Uterine Serosa Involvement: Not identified  Cervical stromal Involvement: Not identified  Extent of involvement of other tissue/organs: Not identified  Peritoneal/Ascitic Fluid: Not applicable  Lymphovascular Invasion: Not identified  Regional Lymph Nodes: Not applicable (no lymph nodes submitted or found)        Pelvic Lymph Nodes Examined:                                    0 Sentinel                                   0 Non-sentinel                                   0 Total  Pelvic Lymph Nodes with Metastasis: Not applicable       Para-aortic Lymph Nodes Examined:                                    0 Sentinel                                    0 non-sentinel                                    0 total       Para-aortic Lymph Nodes with Metastasis: Not applicable  Distant Metastasis:       Distant Site(s) Involved: Not applicable  Pathologic Stage Classification (pTNM, AJCC 8th Edition): pT1b, pN not  assigned  Ancillary Studies: MMR / MSI testing will be ordered  Representative Tumor Block: A3    01/22/2022 - 03/07/2022 Radiation Therapy   01/22/2022 through 03/07/2022 Site Technique Total Dose (Gy) Dose per Fx (Gy) Completed Fx Beam Energies  Vagina: Pelvis HDR-brachy 30/30 6 5/5 Ir-192       03/29/2024 Imaging   IR IMAGING GUIDED PORT INSERTION Result Date: 04/08/2024 CLINICAL DATA:  Recurrent metastatic uterine carcinoma EXAM: RIGHT INTERNAL JUGULAR SINGLE LUMEN POWER PORT CATHETER INSERTION Date:  04/08/2024 04/08/2024 1:24 pm Radiologist:  CHRISTELLA. Frederic Specking, MD Guidance:  Ultrasound and fluoroscopic MEDICATIONS: 1% lidocaine  local with epinephrine  ANESTHESIA/SEDATION: Versed  1.0 mg IV; Fentanyl  50 mcg IV; Moderate Sedation Time:  26 minutes The patient was continuously monitored during the procedure by the interventional radiology nurse under my direct supervision. FLUOROSCOPY: 0 minutes, 48 seconds (6 mGy) COMPLICATIONS: None immediate. CONTRAST:  None. PROCEDURE: Informed consent was obtained from the patient following explanation of the procedure, risks, benefits and alternatives. The patient understands, agrees and consents for the procedure. All questions were addressed. A time out was performed. Maximal barrier sterile technique utilized including caps, mask, sterile gowns, sterile gloves, large sterile drape, hand hygiene, and 2% chlorhexidine  scrub.  Under sterile conditions and local anesthesia, right internal jugular micropuncture venous access was performed. Access was performed with ultrasound. Images were obtained for documentation of the patent right internal jugular vein. A guide wire was inserted followed by a transitional dilator. This allowed insertion of a guide wire and catheter into the IVC. Measurements were obtained from the SVC / RA junction back to the right IJ venotomy site. In the right infraclavicular chest, a subcutaneous pocket was created over the second anterior rib. This was done under sterile conditions and local anesthesia. 1% lidocaine  with epinephrine  was utilized for this. A 2.5 cm incision was made in the skin. Blunt dissection was performed to create a subcutaneous pocket over the right pectoralis major muscle. The pocket was flushed with saline vigorously. There was adequate hemostasis. The port catheter was assembled and checked for leakage. The port catheter was secured in the pocket with two retention sutures. The tubing was tunneled subcutaneously to the right venotomy site and inserted into the SVC/RA junction through a valved peel-away sheath. Position was confirmed with fluoroscopy. Images were obtained for documentation. The patient tolerated the procedure well. No immediate complications. Incisions were closed in a two layer fashion with 4 - 0 Vicryl suture. Dermabond was applied to the skin. The port catheter was accessed, blood  was aspirated followed by saline and heparin  flushes. Needle was removed. A dry sterile dressing was applied. IMPRESSION: Ultrasound and fluoroscopically guided right internal jugular single lumen power port catheter insertion. Tip in the SVC/RA junction. Catheter ready for use. Electronically Signed   By: CHRISTELLA.  Shick M.D.   On: 04/08/2024 14:26   CT ABDOMINAL MASS BIOPSY Result Date: 04/08/2024 INDICATION: LEFT RETROPERITONEAL METASTATIC NODAL MASS EXAM: CT-GUIDED BIOPSY LEFT RETROPERITONEAL  NODAL MASS MEDICATIONS: 1% LIDOCAINE  LOCAL ANESTHESIA/SEDATION: 1.0 mg IV Versed ; 50 mcg IV Fentanyl  Moderate Sedation Time:  12 MINUTE The patient was continuously monitored during the procedure by the interventional radiology nurse under my direct supervision. PROCEDURE: The procedure, risks, benefits, and alternatives were explained to the patient. Questions regarding the procedure were encouraged and answered. The patient understands and consents to the procedure. Previous imaging reviewed. Patient positioned prone. Noncontrast localization CT performed. The left retroperitoneal large nodal mass with central necrosis was localized and marked. Under sterile conditions and local anesthesia, the 17 gauge guide needle was advanced to the peripheral margin of the lesion laterally. Needle position confirmed with CT. 18 gauge core biopsies obtained. These were placed in formalin. Needle removed. Images obtained for documentation. Patient tolerated the procedure well without complication. Vital sign monitoring by nursing staff during the procedure will continue as patient is in the special procedures unit for post procedure observation. FINDINGS: The images document guide needle placement within the large left retroperitoneal nodal mass. Post biopsy images demonstrate no hemorrhage or hematoma. COMPLICATIONS: None immediate. IMPRESSION: Successful CT-guided core biopsy of the left retroperitoneal nodal mass RADIATION DOSE REDUCTION: This exam was performed according to the departmental dose-optimization program which includes automated exposure control, adjustment of the mA and/or kV according to patient size and/or use of iterative reconstruction technique. Electronically Signed   By: CHRISTELLA.  Shick M.D.   On: 04/08/2024 14:25   CT Chest W Contrast Result Date: 04/03/2024 CLINICAL DATA:  Genital cancer, staging Per Dr. Eldonna, new large retroperitoneal mass with lymphadenopathy concerning for recurrence, hx of endometrial  cancer s/p surgery and radiation. * Tracking Code: BO * EXAM: CT CHEST WITH CONTRAST TECHNIQUE: Multidetector CT imaging of the chest was performed during intravenous contrast administration. RADIATION DOSE REDUCTION: This exam was performed according to the departmental dose-optimization program which includes automated exposure control, adjustment of the mA and/or kV according to patient size and/or use of iterative reconstruction technique. CONTRAST:  75mL OMNIPAQUE  IOHEXOL  300 MG/ML  SOLN COMPARISON:  CT Angiography chest from 10/26/2019. FINDINGS: Cardiovascular: Normal cardiac size. No pericardial effusion. No aortic aneurysm. There are coronary artery calcifications, in keeping with coronary artery disease. There are also mild peripheral atherosclerotic vascular calcifications of thoracic aorta and its major branches. Mitral annulus calcifications noted. Mediastinum/Nodes: Visualized thyroid  gland appears grossly unremarkable. No solid / cystic mediastinal masses. The esophagus is nondistended precluding optimal assessment. There is mild circumferential thickening of the lower thoracic esophagus, which is most likely seen in the settings of chronic gastroesophageal reflux disease versus esophagitis. No axillary, mediastinal or hilar lymphadenopathy by size criteria. Lungs/Pleura: The central tracheo-bronchial tree is patent. There are patchy areas of linear, plate-like atelectasis and/or scarring throughout bilateral lungs. There is a new spiculated marginated 9 x 12 mm nodule in the posterior segment of right upper lobe, highly concerning for metastases. There is an additional, part solid nodule in the right upper lobe (series 5, image 49), which measures 4 x 6 mm. There is a 2 x 4 mm noncalcified nodule in the  left lung apex (series 5, image 33), which is indeterminate. No lung mass, consolidation, pleural effusion or pneumothorax. Upper Abdomen: There is dilation of extrahepatic bile duct and central  intrahepatic bile ducts. Please refer to recent CT scan abdomen and pelvis report for details. Remaining visualized upper abdominal viscera within normal limits. Musculoskeletal: The visualized soft tissues of the chest wall are grossly unremarkable. No suspicious osseous lesions. There are minimal multilevel degenerative changes in the visualized spine. IMPRESSION: 1. There is a new spiculated marginated 9 x 12 mm nodule in the posterior segment of right upper lobe, highly concerning for metastases. There is an additional, part solid nodule in the right upper lobe , which measures 4 x 6 mm. 2. No lung mass, consolidation, pleural effusion or pneumothorax. 3. No mediastinal, hilar or axillary lymphadenopathy by size criteria. 4. Multiple other nonacute observations, as described above. Aortic Atherosclerosis (ICD10-I70.0). Electronically Signed   By: Ree Molt M.D.   On: 04/03/2024 17:23   DG Lumbar Spine Complete Result Date: 04/01/2024 CLINICAL DATA:  Low back pain EXAM: LUMBAR SPINE - COMPLETE 4+ VIEW COMPARISON:  None Available. FINDINGS: There is no evidence of lumbar spine fracture. Alignment is normal. Intervertebral disc spaces are maintained. There mild degenerative endplate osteophytes throughout the lumbar spine. Air-fluid level in the pelvis may be within the bladder. IMPRESSION: 1. Mild degenerative changes of the lumbar spine. 2. Air-fluid level in the pelvis may be within the bladder. Electronically Signed   By: Greig Pique M.D.   On: 04/01/2024 22:45   DG FEMUR MIN 2 VIEWS LEFT Result Date: 04/01/2024 CLINICAL DATA:  Low back pain EXAM: LEFT FEMUR 2 VIEWS COMPARISON:  None Available. FINDINGS: There is no evidence of fracture or other focal bone lesions. Peripheral vascular calcifications are present. Joint spaces are well maintained. IMPRESSION: 1. No acute fracture or dislocation. 2. Peripheral vascular calcifications. Electronically Signed   By: Greig Pique M.D.   On: 04/01/2024  22:44   CT ABDOMEN PELVIS W CONTRAST Result Date: 03/29/2024 CLINICAL DATA:  Left pelvic and back pain. Uterine carcinoma. * Tracking Code: BO * EXAM: CT ABDOMEN AND PELVIS WITH CONTRAST TECHNIQUE: Multidetector CT imaging of the abdomen and pelvis was performed using the standard protocol following bolus administration of intravenous contrast. RADIATION DOSE REDUCTION: This exam was performed according to the departmental dose-optimization program which includes automated exposure control, adjustment of the mA and/or kV according to patient size and/or use of iterative reconstruction technique. CONTRAST:  OMNIPAQUE  IOHEXOL  300 MG/ML  SOLN COMPARISON:  12/08/2021 FINDINGS: Lower Chest: No acute findings. Hepatobiliary: No suspicious hepatic masses identified. Prior cholecystectomy. Stable diffuse biliary ductal dilatation, likely due to post cholecystectomy effect. Pancreas:  No mass or inflammatory changes. Spleen: Within normal limits in size and appearance. Adrenals/Urinary Tract: No suspicious masses identified. No evidence of ureteral calculi or hydronephrosis. Gas seen in urinary bladder, presumably from recent instrumentation. Stomach/Bowel: Tiny hiatal hernia again noted. No evidence of obstruction, inflammatory process or abnormal fluid collections. Vascular/Lymphatic: Large left retroperitoneal mass is seen involving the left psoas muscle and left paraaortic region with mild cortical destruction seen involving the adjacent lumbar vertebral bodies. This is new in measures 7.6 x 7.3 cm, consistent with metastatic disease. Mild lymphadenopathy is also seen elsewhere in the left paraaortic region and left iliac chains, with largest measuring 1.8 cm in the left external iliac chain. No acute vascular findings. Reproductive:  Prior hysterectomy. Other:  Small fat-containing umbilical hernia. Musculoskeletal:  No suspicious bone lesions  identified. IMPRESSION: New large left retroperitoneal mass  involving the left psoas muscle and left paraaortic region with cortical destruction in the adjacent lumbar spine vertebral bodies, consistent with metastatic disease. Mild left paraaortic and left iliac lymphadenopathy, consistent with metastatic disease. Electronically Signed   By: Norleen DELENA Kil M.D.   On: 03/29/2024 11:37   CT PELVIS WO CONTRAST Result Date: 03/29/2024 CLINICAL DATA:  Left leg and lower back pain. History of uterine cancer. EXAM: CT PELVIS WITHOUT CONTRAST TECHNIQUE: Multidetector CT imaging of the pelvis was performed following the standard protocol without intravenous contrast. RADIATION DOSE REDUCTION: This exam was performed according to the departmental dose-optimization program which includes automated exposure control, adjustment of the mA and/or kV according to patient size and/or use of iterative reconstruction technique. COMPARISON:  12/08/2021 FINDINGS: Urinary Tract:  Gas is identified in the urinary bladder. Bowel:  Unremarkable visualized pelvic bowel loops. Vascular/Lymphatic: Atherosclerotic calcification noted in the common iliac arteries bilaterally. r 2.9 x 1.4 cm soft tissue nodule is seen along the course of the left external iliac artery and vein (52/6) with adjacent probable 12 mm short axis left pelvic sidewall lymph node on 62/6. Reproductive:  Uterus surgically absent. Other:  No intraperitoneal free fluid. Musculoskeletal: No worrisome lytic or sclerotic osseous abnormality. IMPRESSION: 1. New 2.9 x 1.4 cm soft tissue nodule along the course of the left external iliac artery and vein with adjacent probable 12 mm short axis left pelvic sidewall lymph node. By report, the patient is status post total abdominal hysterectomy with bilateral salpingo oophorectomy on 12/06/2021. As such, this soft tissue nodularity along the left pelvic sidewall is unlikely to represent ovary. In the setting of a uterine cancer diagnosis, metastatic disease is a distinct concern. Dedicated  CT abdomen/pelvis with oral and intravenous contrast recommended to further evaluate. 2. Gas in the urinary bladder. In the absence of recent instrumentation, bladder infection would be a primary consideration. 3.  Aortic Atherosclerosis (ICD10-I70.0). Electronically Signed   By: Camellia Candle M.D.   On: 03/29/2024 09:16   CT Lumbar Spine Wo Contrast Result Date: 03/29/2024 EXAM: CT OF THE LUMBAR SPINE WITHOUT CONTRAST 03/29/2024 08:11:43 AM TECHNIQUE: CT of the lumbar spine was performed without the administration of intravenous contrast. Multiplanar reformatted images are provided for review. Automated exposure control, iterative reconstruction, and/or weight based adjustment of the mA/kV was utilized to reduce the radiation dose to as low as reasonably achievable. COMPARISON: None available. CLINICAL HISTORY: Lumbar radiculopathy, cancer suspected. Per triage notes: Patient coming to ED for evaluation of L leg pain and lower back pain. Reports pain started 2 months ago. Has worsened over the last several days. Was seen by PCP on Thursday and was instructed to follow up with neurologist. Unable to sleep this weekend due to pain. No reports of injuries or new weakness. FINDINGS: BONES AND ALIGNMENT: Mild rightward curvature is present in the lumbar spine. No significant listhesis is present. DEGENERATIVE CHANGES: A leftward disc protrusion and asymmetric facet hypertrophy contribute to moderate left foraminal stenosis at L3-4. A leftward disc protrusion and asymmetric facet hypertrophy contribute to moderate left and mild right foraminal narrowing at L4-5. A leftward disc protrusion is present at L5 to S1. Facet hypertrophy is asymmetric on the right. Mild foraminal narrowing is present bilaterally. SOFT TISSUES: A retroperitoneal left paraspinous soft tissue mass measures 7.5 x 5.5 x 8.5 cm, centered at the L2-3 level. The tumor encroaches on the lateral margin of the left foramen at L2-3. IMPRESSION: 1.  Retroperitoneal  left paraspinous soft tissue mass measuring 7.5 x 5.5 x 8.5 cm, centered at the L2-3 level, encroaching on the lateral margin of the left foramen at L2-3. This is most concerning for metastatic disease. Primary sarcoma could have a similar appearance. Recommend CT of the chest, abdomen and pelvis with contrast for further evaluation 2. Moderate left foraminal stenosis at L3-4 due to leftward disc protrusion and asymmetric facet hypertrophy. 3. Moderate left and mild right foraminal narrowing at L4-5 due to leftward disc protrusion and asymmetric facet hypertrophy. 4. Mild bilateral foraminal narrowing at L5-S1 due to leftward disc protrusion and right asymmetric facet hypertrophy. Electronically signed by: Lonni Necessary MD 03/29/2024 08:38 AM EDT RP Workstation: [id number]      04/08/2024 Pathology Results   SURGICAL PATHOLOGY  [facility]  [address]  [place], [place] [address]  Telephone [phone] or [phone] Fax [phone]   REPORT OF SURGICAL PATHOLOGY   Accession #: [id number]  Patient Name: Brenda, [name]  Visit # : [id number]   MRN: [id number]  Physician: Vanice Ozell Revel  DOB/Age August 15, 1961 (Age: 81) Gender: F  Collected Date: 04/08/2024  Received Date: 04/08/2024   FINAL DIAGNOSIS        1. Retroperitoneal mass, biopsy, left :       METASTATIC ADENOCARCINOMA.       SEE NOTE.        Diagnosis Note : Immunohistochemistry will be performed and reported as an addendum.   Immunohistochemistry shows the adenocarcinoma is positive with PAX8 and p16 shows strong positivity while estrogen receptor is patchy positive.  The carcinoma is negative with Napsin A and p53 is slightly increased in a wild-type pattern (nonmutated).  The morphology and immunophenotype are consistent with metastasis from a gynecologic primary.    04/09/2024 Cancer Staging   Staging form: Corpus Uteri - Carcinoma and Carcinosarcoma, AJCC 8th  Edition and FIGO 2023 - Clinical stage from 04/09/2024: Stage IVB (rcT1, rcN2, rpM1) - Signed by Lonn Hicks, MD on 04/09/2024 Stage prefix: Recurrence   04/24/2024 -  Chemotherapy   Patient is on Treatment Plan : UTERINE Pembrolizumab  (200), Paclitaxel  (80), Carboplatin  (5) q21d x 6 cycles / Pembrolizumab  (400) q42d       MEDICAL HISTORY:  Past Medical History:  Diagnosis Date   Anemia    Arthritis    back, hands, hips (02/22/2015)   CAD (coronary artery disease)    a. complex LAD/diagonal bifurcation PCI in 2010. b. STEMI 10/2019 s/p PTCA/DES x1 to mLAD overlapping the old stent, residual disease treated medicaly.   Cancer [facility])    uterine   CHF (congestive heart failure) (HCC)    Depression    Diabetes mellitus (HCC)    started when I was pregnant; not sure if it was type 1 or type 2    History of radiation therapy    Endometrium- HDR 01/22/22-03/07/22- Dr. Lynwood Nasuti   Hypercholesterolemia    Hypertension    Morbid obesity (HCC)    Myocardial infarction (HCC)    mild x 3   Neuromuscular disorder (HCC)    neuropathy feet   Sleep apnea    cpap/    SURGICAL HISTORY: Past Surgical History:  Procedure Laterality Date   CARDIAC CATHETERIZATION  12/02/2012   CHOLECYSTECTOMY OPEN  09/04/1987   CORONARY ANGIOPLASTY WITH STENT PLACEMENT  09/03/2009   CORONARY/GRAFT ACUTE MI REVASCULARIZATION N/A 10/26/2019   Procedure: CORONARY/GRAFT ACUTE MI REVASCULARIZATION;  Surgeon: Verlin Lonni BIRCH, MD;  Location: [facility] INVASIVE  CV LAB;  Service: Cardiovascular;  Laterality: N/A;   INCISIONAL HERNIA REPAIR N/A 12/09/2021   Procedure: LAPAROSCOPIC REPAIR UMBILICAL HERNIA WITH MESH;  Surgeon: Vernetta Berg, MD;  Location: [facility] ORS;  Service: General;  Laterality: N/A;   IR IMAGING GUIDED PORT INSERTION  04/08/2024   LEFT HEART CATH AND CORONARY ANGIOGRAPHY N/A 10/26/2019   Procedure: LEFT HEART CATH AND CORONARY ANGIOGRAPHY;  Surgeon: Verlin Lonni BIRCH, MD;  Location: [facility] INVASIVE  CV LAB;  Service: Cardiovascular;  Laterality: N/A;   LEFT HEART CATHETERIZATION WITH CORONARY ANGIOGRAM N/A 12/25/2012   Procedure: LEFT HEART CATHETERIZATION WITH CORONARY ANGIOGRAM;  Surgeon: Ozell Fell, MD;  Location: [facility] CATH LAB;  Service: Cardiovascular;  Laterality: N/A;   ROBOTIC ASSISTED TOTAL HYSTERECTOMY WITH BILATERAL SALPINGO OOPHERECTOMY N/A 12/06/2021   Procedure: XI ROBOTIC ASSISTED TOTAL HYSTERECTOMY WITH BILATERAL SALPINGO OOPHORECTOMY;  Surgeon: [name] SAUNDERS, MD;  Location: [facility] ORS;  Service: Gynecology;  Laterality: N/A;   UMBILICAL HERNIA REPAIR  05/04/1989   doesn't think they used mesh    SOCIAL HISTORY: Social History   Socioeconomic History   Marital status: Married    Spouse name: Not on file   Number of children: 2   Years of education: Not on file   Highest education level: Not on file  Occupational History   Occupation: Housewife  Tobacco Use   Smoking status: Former    Current packs/day: 0.00    Average packs/day: 0.5 packs/day for 1 year (0.5 ttl pk-yrs)    Types: Cigarettes, Cigars    Start date: 09/03/1985    Quit date: 09/03/1986    Years since quitting: 37.6   Smokeless tobacco: Never  Vaping Use   Vaping status: Never Used  Substance and Sexual Activity   Alcohol use: No   Drug use: No   Sexual activity: Not Currently  Other Topics Concern   Not on file  Social History Narrative   Not on file   Social Drivers of Health   Financial Resource Strain: High Risk (02/10/2021)   Overall Financial Resource Strain (CARDIA)    Difficulty of Paying Living Expenses: Hard  Food Insecurity: No Food Insecurity (03/29/2024)   Hunger Vital Sign    Worried About Running Out of Food in the Last Year: Never true    Ran Out of Food in the Last Year: Never true  Transportation Needs: No Transportation Needs (03/29/2024)   PRAPARE - Administrator, Civil Service (Medical): No    Lack of Transportation (Non-Medical): No  Physical Activity:  Not on file  Stress: Not on file  Social Connections: Not on file  Intimate Partner Violence: Not At Risk (03/29/2024)   Humiliation, Afraid, Rape, and Kick questionnaire    Fear of Current or Ex-Partner: No    Emotionally Abused: No    Physically Abused: No    Sexually Abused: No    FAMILY HISTORY: Family History  Problem Relation Age of Onset   Coronary artery disease Mother        in her mid 11s   Hypertension Mother    Cancer Mother        skin   Heart attack Father        in his mid 56s   COPD Father    Stroke Sister    Coronary artery disease Sister    Diabetes Sister    Other Half-Sister    Thyroid  cancer Daughter    Prostate cancer Neg Hx    Colon cancer Neg  Hx    Breast cancer Neg Hx    Endometrial cancer Neg Hx    Ovarian cancer Neg Hx    Pancreatic cancer Neg Hx     ALLERGIES:  is allergic to hydrocodone and clopidogrel.  MEDICATIONS:  Current Outpatient Medications  Medication Sig Dispense Refill   morphine  (MS CONTIN ) 15 MG 12 hr tablet Take 1 tablet (15 mg total) by mouth at bedtime. 30 tablet 0   acetaminophen  (TYLENOL ) 500 MG tablet Take 1-2 tablets (500-1,000 mg total) by mouth every 8 (eight) hours as needed for mild pain (pain score 1-3) or moderate pain (pain score 4-6).     atorvastatin  (LIPITOR ) 80 MG tablet Take 1 tablet (80 mg total) by mouth every evening. 30 tablet 6   buPROPion  (WELLBUTRIN  XL) 150 MG 24 hr tablet Take 150 mg by mouth in the morning.     Cholecalciferol  (VITAMIN D3 SUPER STRENGTH) 50 MCG (2000 UT) CAPS Take 2,000 Units by mouth in the morning.     Continuous Glucose Sensor (FREESTYLE LIBRE 3 SENSOR) MISC Inject 1 Device into the skin every 14 (fourteen) days.     FIASP  FLEXTOUCH 100 UNIT/ML FlexTouch Pen Inject 5-8 Units into the skin See admin instructions. Inject 5 units into the skin before breakfast & lunch and 8 units before supper/evening meal     FLUoxetine  (PROZAC ) 40 MG capsule Take 40 mg by mouth daily.     furosemide   (LASIX ) 40 MG tablet Take 1 tablet by mouth once daily 30 tablet 0   insulin  degludec (TRESIBA) 100 UNIT/ML FlexTouch Pen Inject 20 Units into the skin every evening.     lidocaine -prilocaine  (EMLA ) cream Apply to affected area once 30 g 3   losartan  (COZAAR ) 25 MG tablet Take 1 tablet (25 mg total) by mouth daily. 90 tablet 3   metFORMIN (GLUCOPHAGE-XR) 500 MG 24 hr tablet Take 1,000 mg by mouth in the morning and at bedtime.     Misc. Devices MISC Portable oxygen  concentrator, 2L oxygen .  Diagnoses-chronic respiratory failure with hypoxia 1 each 0   nitroGLYCERIN  (NITROSTAT ) 0.4 MG SL tablet Dissolve 1 tablet under the tongue every 5 minutes as needed for chest pain. Max of 3 doses, then 911. 25 tablet 6   ondansetron  (ZOFRAN ) 8 MG tablet Take 1 tablet (8 mg total) by mouth every 8 (eight) hours as needed for nausea or vomiting. Start on the third day after carboplatin. 30 tablet 1   Oxycodone  HCl 10 MG TABS Take 1 tablet (10 mg total) by mouth every 4 (four) hours as needed for severe pain (pain score 7-10). 60 tablet 0   OXYGEN  Inhale 2 L/min into the lungs continuous.     polyethylene glycol powder (GLYCOLAX /MIRALAX ) 17 GM/SCOOP powder Take 17 g by mouth daily as needed for mild constipation.     prasugrel  (EFFIENT ) 5 MG TABS tablet Take 1 tablet (5 mg total) by mouth in the morning. AFTER TAKING YOUR DOSE ON 04/07/2024, STOP TAKING IT IN PREPARATION FOR THE BIOPSY ON 04/15/2024. AFTER THE BIOPSY, FOLLOW THE RADIOLOGIST RECOMMENDATIONS AS TO TIMING OF RESTARTING THIS MEDICINE.     pregabalin  (LYRICA ) 100 MG capsule Take 1 capsule (100 mg total) by mouth 3 (three) times daily. 90 capsule 6   prochlorperazine  (COMPAZINE ) 10 MG tablet Take 1 tablet (10 mg total) by mouth every 6 (six) hours as needed for nausea or vomiting. 30 tablet 1   Respiratory Therapy Supplies (CARETOUCH 2 CPAP HOSE HANGER) MISC daily at night  No current facility-administered medications for this visit.    REVIEW OF  SYSTEMS: She has baseline peripheral neuropathy in her feet that she describes as burning sensation.  She has been on insulin  for almost 30 years. All other systems were reviewed with the patient and are negative.  PHYSICAL EXAMINATION: ECOG PERFORMANCE STATUS: 2 - Symptomatic, <50% confined to bed  Vitals:   04/16/24 1321  BP: (!) 104/54  Pulse: 97  Resp: 18  Temp: (!) 97.4 F (36.3 C)  SpO2: 99%   Filed Weights   04/16/24 1321  Weight: 193 lb 12.8 oz (87.9 kg)    GENERAL:alert, no distress and comfortable.  Limited examination due to patient sitting on the wheelchair.  She is using oxygen  therapy SKIN: No other history to them EYES: normal, conjunctiva are pink and non-injected, sclera clear OROPHARYNX:no exudate, no erythema and lips, buccal mucosa, and tongue normal  NECK: supple, thyroid  normal size, non-tender, without nodularity LYMPH:  no palpable lymphadenopathy in the cervical, axillary or inguinal LUNGS: clear to auscultation and percussion with normal breathing effort HEART: regular rate & rhythm and no murmurs and no lower extremity edema ABDOMEN:abdomen soft, non-tender and normal bowel sounds.  Central obesity is noted Musculoskeletal:no cyanosis of digits and no clubbing  PSYCH: alert & oriented x 3 with fluent speech NEURO: no focal motor/sensory deficits  LABORATORY DATA:  I have reviewed the data as listed Lab Results  Component Value Date   WBC 14.5 (H) 04/08/2024   HGB 10.1 (L) 04/08/2024   HCT 30.1 (L) 04/08/2024   MCV 82.0 04/08/2024   PLT 275 04/08/2024   Recent Labs    04/19/23 1713 03/29/24 0919 03/29/24 1512 03/30/24 0531 03/30/24 1421 04/08/24 1046  NA  --    < >  --  137 133* 131*  K  --    < >  --  5.2* 3.7 3.2*  CL  --    < >  --  101 97* 95*  CO2  --    < >  --  25 27 24   GLUCOSE  --    < >  --  203* 314* 426*  BUN  --    < >  --  13 13 10   CREATININE  --    < >  --  0.77 0.77 0.92  CALCIUM   --    < >  --  9.1 8.7* 8.7*   GFRNONAA  --    < >  --  >60 >60 >60  PROT 7.8  --  6.4*  --   --   --   ALBUMIN 3.9  --  2.6*  --   --   --   AST 18  --  12*  --   --   --   ALT 25  --  10  --   --   --   ALKPHOS 75  --  80  --   --   --   BILITOT 0.6  --  0.9  --   --   --   BILIDIR 0.1  --  <0.1  --   --   --   IBILI 0.5  --  NOT CALCULATED  --   --   --    < > = values in this interval not displayed.    RADIOGRAPHIC STUDIES: I have personally reviewed the radiological images as listed and agreed with the findings in the report. IR IMAGING GUIDED PORT INSERTION Result Date:  04/08/2024 CLINICAL DATA:  Recurrent metastatic uterine carcinoma EXAM: RIGHT INTERNAL JUGULAR SINGLE LUMEN POWER PORT CATHETER INSERTION Date:  04/08/2024 04/08/2024 1:24 pm Radiologist:  M. Frederic Specking, MD Guidance:  Ultrasound and fluoroscopic MEDICATIONS: 1% lidocaine  local with epinephrine  ANESTHESIA/SEDATION: Versed  1.0 mg IV; Fentanyl  50 mcg IV; Moderate Sedation Time:  26 minutes The patient was continuously monitored during the procedure by the interventional radiology nurse under my direct supervision. FLUOROSCOPY: 0 minutes, 48 seconds (6 mGy) COMPLICATIONS: None immediate. CONTRAST:  None. PROCEDURE: Informed consent was obtained from the patient following explanation of the procedure, risks, benefits and alternatives. The patient understands, agrees and consents for the procedure. All questions were addressed. A time out was performed. Maximal barrier sterile technique utilized including caps, mask, sterile gowns, sterile gloves, large sterile drape, hand hygiene, and 2% chlorhexidine  scrub. Under sterile conditions and local anesthesia, right internal jugular micropuncture venous access was performed. Access was performed with ultrasound. Images were obtained for documentation of the patent right internal jugular vein. A guide wire was inserted followed by a transitional dilator. This allowed insertion of a guide wire and catheter into the IVC.  Measurements were obtained from the SVC / RA junction back to the right IJ venotomy site. In the right infraclavicular chest, a subcutaneous pocket was created over the second anterior rib. This was done under sterile conditions and local anesthesia. 1% lidocaine  with epinephrine  was utilized for this. A 2.5 cm incision was made in the skin. Blunt dissection was performed to create a subcutaneous pocket over the right pectoralis major muscle. The pocket was flushed with saline vigorously. There was adequate hemostasis. The port catheter was assembled and checked for leakage. The port catheter was secured in the pocket with two retention sutures. The tubing was tunneled subcutaneously to the right venotomy site and inserted into the SVC/RA junction through a valved peel-away sheath. Position was confirmed with fluoroscopy. Images were obtained for documentation. The patient tolerated the procedure well. No immediate complications. Incisions were closed in a two layer fashion with 4 - 0 Vicryl suture. Dermabond was applied to the skin. The port catheter was accessed, blood was aspirated followed by saline and heparin  flushes. Needle was removed. A dry sterile dressing was applied. IMPRESSION: Ultrasound and fluoroscopically guided right internal jugular single lumen power port catheter insertion. Tip in the SVC/RA junction. Catheter ready for use. Electronically Signed   By: CHRISTELLA.  Shick M.D.   On: 04/08/2024 14:26   CT ABDOMINAL MASS BIOPSY Result Date: 04/08/2024 INDICATION: LEFT RETROPERITONEAL METASTATIC NODAL MASS EXAM: CT-GUIDED BIOPSY LEFT RETROPERITONEAL NODAL MASS MEDICATIONS: 1% LIDOCAINE  LOCAL ANESTHESIA/SEDATION: 1.0 mg IV Versed ; 50 mcg IV Fentanyl  Moderate Sedation Time:  12 MINUTE The patient was continuously monitored during the procedure by the interventional radiology nurse under my direct supervision. PROCEDURE: The procedure, risks, benefits, and alternatives were explained to the patient. Questions  regarding the procedure were encouraged and answered. The patient understands and consents to the procedure. Previous imaging reviewed. Patient positioned prone. Noncontrast localization CT performed. The left retroperitoneal large nodal mass with central necrosis was localized and marked. Under sterile conditions and local anesthesia, the 17 gauge guide needle was advanced to the peripheral margin of the lesion laterally. Needle position confirmed with CT. 18 gauge core biopsies obtained. These were placed in formalin. Needle removed. Images obtained for documentation. Patient tolerated the procedure well without complication. Vital sign monitoring by nursing staff during the procedure will continue as patient is in the special procedures unit for post  procedure observation. FINDINGS: The images document guide needle placement within the large left retroperitoneal nodal mass. Post biopsy images demonstrate no hemorrhage or hematoma. COMPLICATIONS: None immediate. IMPRESSION: Successful CT-guided core biopsy of the left retroperitoneal nodal mass RADIATION DOSE REDUCTION: This exam was performed according to the departmental dose-optimization program which includes automated exposure control, adjustment of the mA and/or kV according to patient size and/or use of iterative reconstruction technique. Electronically Signed   By: CHRISTELLA.  Shick M.D.   On: 04/08/2024 14:25   CT Chest W Contrast Result Date: 04/03/2024 CLINICAL DATA:  Genital cancer, staging Per Dr. Eldonna, new large retroperitoneal mass with lymphadenopathy concerning for recurrence, hx of endometrial cancer s/p surgery and radiation. * Tracking Code: BO * EXAM: CT CHEST WITH CONTRAST TECHNIQUE: Multidetector CT imaging of the chest was performed during intravenous contrast administration. RADIATION DOSE REDUCTION: This exam was performed according to the departmental dose-optimization program which includes automated exposure control, adjustment of the mA  and/or kV according to patient size and/or use of iterative reconstruction technique. CONTRAST:  75mL OMNIPAQUE  IOHEXOL  300 MG/ML  SOLN COMPARISON:  CT Angiography chest from 10/26/2019. FINDINGS: Cardiovascular: Normal cardiac size. No pericardial effusion. No aortic aneurysm. There are coronary artery calcifications, in keeping with coronary artery disease. There are also mild peripheral atherosclerotic vascular calcifications of thoracic aorta and its major branches. Mitral annulus calcifications noted. Mediastinum/Nodes: Visualized thyroid  gland appears grossly unremarkable. No solid / cystic mediastinal masses. The esophagus is nondistended precluding optimal assessment. There is mild circumferential thickening of the lower thoracic esophagus, which is most likely seen in the settings of chronic gastroesophageal reflux disease versus esophagitis. No axillary, mediastinal or hilar lymphadenopathy by size criteria. Lungs/Pleura: The central tracheo-bronchial tree is patent. There are patchy areas of linear, plate-like atelectasis and/or scarring throughout bilateral lungs. There is a new spiculated marginated 9 x 12 mm nodule in the posterior segment of right upper lobe, highly concerning for metastases. There is an additional, part solid nodule in the right upper lobe (series 5, image 49), which measures 4 x 6 mm. There is a 2 x 4 mm noncalcified nodule in the left lung apex (series 5, image 33), which is indeterminate. No lung mass, consolidation, pleural effusion or pneumothorax. Upper Abdomen: There is dilation of extrahepatic bile duct and central intrahepatic bile ducts. Please refer to recent CT scan abdomen and pelvis report for details. Remaining visualized upper abdominal viscera within normal limits. Musculoskeletal: The visualized soft tissues of the chest wall are grossly unremarkable. No suspicious osseous lesions. There are minimal multilevel degenerative changes in the visualized spine.  IMPRESSION: 1. There is a new spiculated marginated 9 x 12 mm nodule in the posterior segment of right upper lobe, highly concerning for metastases. There is an additional, part solid nodule in the right upper lobe , which measures 4 x 6 mm. 2. No lung mass, consolidation, pleural effusion or pneumothorax. 3. No mediastinal, hilar or axillary lymphadenopathy by size criteria. 4. Multiple other nonacute observations, as described above. Aortic Atherosclerosis (ICD10-I70.0). Electronically Signed   By: Ree Molt M.D.   On: 04/03/2024 17:23   DG Lumbar Spine Complete Result Date: 04/01/2024 CLINICAL DATA:  Low back pain EXAM: LUMBAR SPINE - COMPLETE 4+ VIEW COMPARISON:  None Available. FINDINGS: There is no evidence of lumbar spine fracture. Alignment is normal. Intervertebral disc spaces are maintained. There mild degenerative endplate osteophytes throughout the lumbar spine. Air-fluid level in the pelvis may be within the bladder. IMPRESSION: 1. Mild degenerative changes  of the lumbar spine. 2. Air-fluid level in the pelvis may be within the bladder. Electronically Signed   By: Greig Pique M.D.   On: 04/01/2024 22:45   DG FEMUR MIN 2 VIEWS LEFT Result Date: 04/01/2024 CLINICAL DATA:  Low back pain EXAM: LEFT FEMUR 2 VIEWS COMPARISON:  None Available. FINDINGS: There is no evidence of fracture or other focal bone lesions. Peripheral vascular calcifications are present. Joint spaces are well maintained. IMPRESSION: 1. No acute fracture or dislocation. 2. Peripheral vascular calcifications. Electronically Signed   By: Greig Pique M.D.   On: 04/01/2024 22:44   CT ABDOMEN PELVIS W CONTRAST Result Date: 03/29/2024 CLINICAL DATA:  Left pelvic and back pain. Uterine carcinoma. * Tracking Code: BO * EXAM: CT ABDOMEN AND PELVIS WITH CONTRAST TECHNIQUE: Multidetector CT imaging of the abdomen and pelvis was performed using the standard protocol following bolus administration of intravenous contrast. RADIATION  DOSE REDUCTION: This exam was performed according to the departmental dose-optimization program which includes automated exposure control, adjustment of the mA and/or kV according to patient size and/or use of iterative reconstruction technique. CONTRAST:  OMNIPAQUE  IOHEXOL  300 MG/ML  SOLN COMPARISON:  12/08/2021 FINDINGS: Lower Chest: No acute findings. Hepatobiliary: No suspicious hepatic masses identified. Prior cholecystectomy. Stable diffuse biliary ductal dilatation, likely due to post cholecystectomy effect. Pancreas:  No mass or inflammatory changes. Spleen: Within normal limits in size and appearance. Adrenals/Urinary Tract: No suspicious masses identified. No evidence of ureteral calculi or hydronephrosis. Gas seen in urinary bladder, presumably from recent instrumentation. Stomach/Bowel: Tiny hiatal hernia again noted. No evidence of obstruction, inflammatory process or abnormal fluid collections. Vascular/Lymphatic: Large left retroperitoneal mass is seen involving the left psoas muscle and left paraaortic region with mild cortical destruction seen involving the adjacent lumbar vertebral bodies. This is new in measures 7.6 x 7.3 cm, consistent with metastatic disease. Mild lymphadenopathy is also seen elsewhere in the left paraaortic region and left iliac chains, with largest measuring 1.8 cm in the left external iliac chain. No acute vascular findings. Reproductive:  Prior hysterectomy. Other:  Small fat-containing umbilical hernia. Musculoskeletal:  No suspicious bone lesions identified. IMPRESSION: New large left retroperitoneal mass involving the left psoas muscle and left paraaortic region with cortical destruction in the adjacent lumbar spine vertebral bodies, consistent with metastatic disease. Mild left paraaortic and left iliac lymphadenopathy, consistent with metastatic disease. Electronically Signed   By: Norleen DELENA Kil M.D.   On: 03/29/2024 11:37   CT PELVIS WO CONTRAST Result Date:  03/29/2024 CLINICAL DATA:  Left leg and lower back pain. History of uterine cancer. EXAM: CT PELVIS WITHOUT CONTRAST TECHNIQUE: Multidetector CT imaging of the pelvis was performed following the standard protocol without intravenous contrast. RADIATION DOSE REDUCTION: This exam was performed according to the departmental dose-optimization program which includes automated exposure control, adjustment of the mA and/or kV according to patient size and/or use of iterative reconstruction technique. COMPARISON:  12/08/2021 FINDINGS: Urinary Tract:  Gas is identified in the urinary bladder. Bowel:  Unremarkable visualized pelvic bowel loops. Vascular/Lymphatic: Atherosclerotic calcification noted in the common iliac arteries bilaterally. r 2.9 x 1.4 cm soft tissue nodule is seen along the course of the left external iliac artery and vein (52/6) with adjacent probable 12 mm short axis left pelvic sidewall lymph node on 62/6. Reproductive:  Uterus surgically absent. Other:  No intraperitoneal free fluid. Musculoskeletal: No worrisome lytic or sclerotic osseous abnormality. IMPRESSION: 1. New 2.9 x 1.4 cm soft tissue nodule along the course of the  left external iliac artery and vein with adjacent probable 12 mm short axis left pelvic sidewall lymph node. By report, the patient is status post total abdominal hysterectomy with bilateral salpingo oophorectomy on 12/06/2021. As such, this soft tissue nodularity along the left pelvic sidewall is unlikely to represent ovary. In the setting of a uterine cancer diagnosis, metastatic disease is a distinct concern. Dedicated CT abdomen/pelvis with oral and intravenous contrast recommended to further evaluate. 2. Gas in the urinary bladder. In the absence of recent instrumentation, bladder infection would be a primary consideration. 3.  Aortic Atherosclerosis (ICD10-I70.0). Electronically Signed   By: Camellia Candle M.D.   On: 03/29/2024 09:16   CT Lumbar Spine Wo Contrast Result  Date: 03/29/2024 EXAM: CT OF THE LUMBAR SPINE WITHOUT CONTRAST 03/29/2024 08:11:43 AM TECHNIQUE: CT of the lumbar spine was performed without the administration of intravenous contrast. Multiplanar reformatted images are provided for review. Automated exposure control, iterative reconstruction, and/or weight based adjustment of the mA/kV was utilized to reduce the radiation dose to as low as reasonably achievable. COMPARISON: None available. CLINICAL HISTORY: Lumbar radiculopathy, cancer suspected. Per triage notes: Patient coming to ED for evaluation of L leg pain and lower back pain. Reports pain started 2 months ago. Has worsened over the last several days. Was seen by PCP on Thursday and was instructed to follow up with neurologist. Unable to sleep this weekend due to pain. No reports of injuries or new weakness. FINDINGS: BONES AND ALIGNMENT: Mild rightward curvature is present in the lumbar spine. No significant listhesis is present. DEGENERATIVE CHANGES: A leftward disc protrusion and asymmetric facet hypertrophy contribute to moderate left foraminal stenosis at L3-4. A leftward disc protrusion and asymmetric facet hypertrophy contribute to moderate left and mild right foraminal narrowing at L4-5. A leftward disc protrusion is present at L5 to S1. Facet hypertrophy is asymmetric on the right. Mild foraminal narrowing is present bilaterally. SOFT TISSUES: A retroperitoneal left paraspinous soft tissue mass measures 7.5 x 5.5 x 8.5 cm, centered at the L2-3 level. The tumor encroaches on the lateral margin of the left foramen at L2-3. IMPRESSION: 1. Retroperitoneal left paraspinous soft tissue mass measuring 7.5 x 5.5 x 8.5 cm, centered at the L2-3 level, encroaching on the lateral margin of the left foramen at L2-3. This is most concerning for metastatic disease. Primary sarcoma could have a similar appearance. Recommend CT of the chest, abdomen and pelvis with contrast for further evaluation 2. Moderate left  foraminal stenosis at L3-4 due to leftward disc protrusion and asymmetric facet hypertrophy. 3. Moderate left and mild right foraminal narrowing at L4-5 due to leftward disc protrusion and asymmetric facet hypertrophy. 4. Mild bilateral foraminal narrowing at L5-S1 due to leftward disc protrusion and right asymmetric facet hypertrophy. Electronically signed by: Lonni Necessary MD 03/29/2024 08:38 AM EDT RP Workstation: [id number]

## 2024-04-16 NOTE — Assessment & Plan Note (Signed)
 Counseled and coordinated advanced care planning today.  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 Treatment offered is palliative in nature. We discussed a realistic and effective plan that will be reviewed and updated on an ongoing basis as indicated.

## 2024-04-16 NOTE — Assessment & Plan Note (Signed)
 She has uncontrolled cancer associated pain I recommend increasing her pain medicine to 10 mg of oxycodone  to take as needed along with acetaminophen  as needed I also will add long-acting MS Contin  daily for better pain control at night I warned her about risk of sedation and constipation I will see her again next week to assess pain control We discussed narcotic refill policy I have discontinued her muscle relaxant

## 2024-04-16 NOTE — Assessment & Plan Note (Signed)
 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

## 2024-04-16 NOTE — Assessment & Plan Note (Signed)
 The patient was originally diagnosed with uterine cancer in 2023, status post surgery and adjuvant radiation treatment She was noted to have cancer recurrence in July 2025 when she presented with severe back pain with CT imaging showing recurrent disease in the retroperitoneal lymph node.  CT imaging of the chest shows spiculated lung mass suspicious for metastatic spread Pathology: Left retroperitoneal mass biopsy confirmed adenocarcinoma, ER positive, p53 wild-type.  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

## 2024-04-17 ENCOUNTER — Other Ambulatory Visit: Payer: Self-pay

## 2024-04-17 ENCOUNTER — Telehealth: Payer: Self-pay | Admitting: Licensed Clinical Social Worker

## 2024-04-17 ENCOUNTER — Inpatient Hospital Stay: Admitting: Licensed Clinical Social Worker

## 2024-04-17 NOTE — Telephone Encounter (Signed)
 [facility] Clinical Social Work  CSW received response from L. Sambol with [facility] confirming that, if pt applies for food stamps, it would not have a negative effect on disability payment.  CSW informed pt of this by phone. Pt will apply for SNAP benefits.   Brenda [name] E Phyliss Hulick, LCSW

## 2024-04-17 NOTE — Progress Notes (Signed)
 [facility] Clinical Social Work  Initial Assessment   Brenda [name] is a 63 y.o. year old female contacted by phone. Clinical Social Work was referred by distress screen protocol for distress screen needs.   SDOH (Social Determinants of Health) assessments performed: Yes SDOH Interventions    Flowsheet Row Clinical Support from 04/17/2024 in [facility] - A Dept Of [place]. [facility] Office Visit from 03/22/2021 in [facility] [place] - A Dept Of [facility]. [facility] ED to Hosp-Admission (Discharged) from 02/08/2021 in [facility] 3E HF PCU  SDOH Interventions     Food Insecurity Interventions Other (Comment)  [pt connected w food pantries] -- Assist with SNAP Application  Housing Interventions Intervention Not Indicated -- Intervention Not Indicated  Transportation Interventions -- -- Intervention Not Indicated  Depression Interventions/Treatment  -- Medication --  Financial Strain Interventions -- -- Artist, Other (Comment)  [provided pt. with CAFA application and referral to [facility] outpatient clinic for disability and food stamp application needs]    SDOH Screenings   Food Insecurity: Food Insecurity Present (04/17/2024)  Housing: Low Risk  (04/17/2024)  Transportation Needs: No Transportation Needs (03/29/2024)  Utilities: Not At Risk (03/29/2024)  Depression (PHQ2-9): Low Risk  (09/25/2023)  Financial Resource Strain: High Risk (02/10/2021)  Tobacco Use: Medium Risk (04/16/2024)    Distress Screen completed: Yes    04/16/2024    6:40 PM  ONCBCN DISTRESS SCREENING  Screening Type Initial Screening  How much distress have you been experiencing in the past week? (0-10) 8  Practical concerns type Taking care of others;Treatment decisions  Social concerns type Relationship with friends or coworkers  Emotional concerns type Worry or anxiety;Sadness or depression;Fear;Grief or loss;Anger;Feelings of worthlessness or being a  burden  Spiritual/Religous concerns type Death, dying, or afterlife;Relationship with the sacred  Physical Concerns Type  Pain;Sleep;Memory or concentration      Family/Social Information:  Housing Arrangement: patient lives with her nephew for whom she is guardian (her sister is deceased). Nephew is 17, almost 18yo.  Pt is separated from her spouse. She has 2 adult daughters who live nearby Family members/support persons in your life? Family, Friends, and Church family- part of prayer meetings on Wednesdays and those women are offering prayers as well as meal trains and rides Transportation concerns: no  Employment: Legally disabled .  Income source: Social Security Disability. Nephew also receives SSI Financial concerns: Yes, current concerns Type of concern: Utilities and Food Food access concerns: yes, uses [employer]. Does not receive SNAP currently Religious or spiritual practice: Yes-involved in her church and goes to [employer] group on Wednesdays Advanced directives: No Services Currently in place:  [employer]y [employer], disability  Coping/ Adjustment to diagnosis: Patient understands treatment plan and what happens next? yes, she is nervous about chemo due to her other health issues and how she will feel.  She is also upset and frustrated that this wasn't caught sooner. Pt is allowing herself space to break down and then rebuild and is able to do so by leaning on family and friends. She is also concerned for her nephew who she is the guardian for since he was adopted by her sister who is now deceased and he now as another caregiver dealing with life threatening illness Concerns about diagnosis and/or treatment: Feelings of anger or sadness, How I will care for other members of my family, and Quality of life Patient  reported stressors: Anxiety/ nervousness, Adjusting to my illness, and Facing my mortality Patient enjoys time with family/ friends- has  a birthday party/family reunion coming up end of August Current coping skills/ strengths: Ability for insight , Capable of independent living , Motivation for treatment/growth , Religious Affiliation , and Supportive family/friends     SUMMARY: Current SDOH Barriers:  Financial constraints related to fixed income and increased costs with health concerns Emotional wellbeing with adjustment to stage IV cancer  Clinical Social Work Clinical Goal(s):  Explore community resource options for unmet needs related to:  Corporate treasurer  and Stress  Interventions: Discussed common feeling and emotions when being diagnosed with cancer, and the importance of support during treatment Informed patient of the support team roles and support services at [facility] Provided CSW contact information and encouraged patient to call with any questions or concerns Provided brief mental health counseling with regard to adjustment to cancer  Provided patient with information about [employer]. CSW is checking on SNAP & disability impacts    Follow Up Plan: CSW will see patient on 8/22 in infusion for [employer] [facility] application Patient verbalizes understanding of plan: Yes    Brenda Tessier E Sian Rockers, LCSW Clinical Social Worker [facility]  Patient is participating in a Managed Medicaid Plan:  Yes

## 2024-04-17 NOTE — Progress Notes (Signed)
 Pharmacist Chemotherapy Monitoring - Initial Assessment    Anticipated start date: 04/24/24   The following has been reviewed per standard work regarding the patient's treatment regimen: The patient's diagnosis, treatment plan and drug doses, and organ/hematologic function Lab orders and baseline tests specific to treatment regimen  The treatment plan start date, drug sequencing, and pre-medications Prior authorization status  Patient's documented medication list, including drug-drug interaction screen and prescriptions for anti-emetics and supportive care specific to the treatment regimen The drug concentrations, fluid compatibility, administration routes, and timing of the medications to be used The patient's access for treatment and lifetime cumulative dose history, if applicable  The patient's medication allergies and previous infusion related reactions, if applicable   Changes made to treatment plan:  N/A  Follow up needed:  Pending authorization for treatment    Brenda [name], RPH, 04/17/2024  11:01 AM

## 2024-04-20 ENCOUNTER — Observation Stay (HOSPITAL_COMMUNITY)

## 2024-04-20 ENCOUNTER — Inpatient Hospital Stay (HOSPITAL_COMMUNITY)
Admission: EM | Admit: 2024-04-20 | Discharge: 2024-04-24 | DRG: 637 | Disposition: A | Discharge status: Home / Self Care | Attending: Student | Admitting: Student

## 2024-04-20 ENCOUNTER — Emergency Department (HOSPITAL_COMMUNITY)

## 2024-04-20 DIAGNOSIS — E785 Hyperlipidemia, unspecified: Secondary | ICD-10-CM | POA: Diagnosis present

## 2024-04-20 DIAGNOSIS — E875 Hyperkalemia: Secondary | ICD-10-CM | POA: Diagnosis present

## 2024-04-20 DIAGNOSIS — Z6834 Body mass index (BMI) 34.0-34.9, adult: Secondary | ICD-10-CM

## 2024-04-20 DIAGNOSIS — Z955 Presence of coronary angioplasty implant and graft: Secondary | ICD-10-CM

## 2024-04-20 DIAGNOSIS — E1110 Type 2 diabetes mellitus with ketoacidosis without coma: Principal | ICD-10-CM | POA: Diagnosis present

## 2024-04-20 DIAGNOSIS — Z833 Family history of diabetes mellitus: Secondary | ICD-10-CM

## 2024-04-20 DIAGNOSIS — E7800 Pure hypercholesterolemia, unspecified: Secondary | ICD-10-CM | POA: Diagnosis present

## 2024-04-20 DIAGNOSIS — R4701 Aphasia: Principal | ICD-10-CM

## 2024-04-20 DIAGNOSIS — Z823 Family history of stroke: Secondary | ICD-10-CM

## 2024-04-20 DIAGNOSIS — N390 Urinary tract infection, site not specified: Secondary | ICD-10-CM | POA: Diagnosis present

## 2024-04-20 DIAGNOSIS — Z885 Allergy status to narcotic agent status: Secondary | ICD-10-CM

## 2024-04-20 DIAGNOSIS — E118 Type 2 diabetes mellitus with unspecified complications: Secondary | ICD-10-CM

## 2024-04-20 DIAGNOSIS — F32A Depression, unspecified: Secondary | ICD-10-CM | POA: Diagnosis present

## 2024-04-20 DIAGNOSIS — I252 Old myocardial infarction: Secondary | ICD-10-CM

## 2024-04-20 DIAGNOSIS — Z79899 Other long term (current) drug therapy: Secondary | ICD-10-CM

## 2024-04-20 DIAGNOSIS — Z923 Personal history of irradiation: Secondary | ICD-10-CM

## 2024-04-20 DIAGNOSIS — F419 Anxiety disorder, unspecified: Secondary | ICD-10-CM | POA: Diagnosis not present

## 2024-04-20 DIAGNOSIS — E1140 Type 2 diabetes mellitus with diabetic neuropathy, unspecified: Secondary | ICD-10-CM | POA: Diagnosis present

## 2024-04-20 DIAGNOSIS — Z9981 Dependence on supplemental oxygen: Secondary | ICD-10-CM

## 2024-04-20 DIAGNOSIS — Z87891 Personal history of nicotine dependence: Secondary | ICD-10-CM

## 2024-04-20 DIAGNOSIS — I5032 Chronic diastolic (congestive) heart failure: Secondary | ICD-10-CM | POA: Diagnosis not present

## 2024-04-20 DIAGNOSIS — E66811 Obesity, class 1: Secondary | ICD-10-CM | POA: Diagnosis present

## 2024-04-20 DIAGNOSIS — J9611 Chronic respiratory failure with hypoxia: Secondary | ICD-10-CM | POA: Diagnosis present

## 2024-04-20 DIAGNOSIS — J9610 Chronic respiratory failure, unspecified whether with hypoxia or hypercapnia: Secondary | ICD-10-CM | POA: Diagnosis present

## 2024-04-20 DIAGNOSIS — I2510 Atherosclerotic heart disease of native coronary artery without angina pectoris: Secondary | ICD-10-CM | POA: Diagnosis not present

## 2024-04-20 DIAGNOSIS — I6782 Cerebral ischemia: Secondary | ICD-10-CM | POA: Diagnosis not present

## 2024-04-20 DIAGNOSIS — C3411 Malignant neoplasm of upper lobe, right bronchus or lung: Secondary | ICD-10-CM | POA: Diagnosis present

## 2024-04-20 DIAGNOSIS — R29706 NIHSS score 6: Secondary | ICD-10-CM

## 2024-04-20 DIAGNOSIS — F418 Other specified anxiety disorders: Secondary | ICD-10-CM | POA: Diagnosis present

## 2024-04-20 DIAGNOSIS — D630 Anemia in neoplastic disease: Secondary | ICD-10-CM | POA: Diagnosis present

## 2024-04-20 DIAGNOSIS — Z7984 Long term (current) use of oral hypoglycemic drugs: Secondary | ICD-10-CM

## 2024-04-20 DIAGNOSIS — Z90710 Acquired absence of both cervix and uterus: Secondary | ICD-10-CM

## 2024-04-20 DIAGNOSIS — I5030 Unspecified diastolic (congestive) heart failure: Secondary | ICD-10-CM | POA: Diagnosis present

## 2024-04-20 DIAGNOSIS — G9341 Metabolic encephalopathy: Secondary | ICD-10-CM | POA: Diagnosis present

## 2024-04-20 DIAGNOSIS — R29818 Other symptoms and signs involving the nervous system: Secondary | ICD-10-CM | POA: Diagnosis present

## 2024-04-20 DIAGNOSIS — Z825 Family history of asthma and other chronic lower respiratory diseases: Secondary | ICD-10-CM

## 2024-04-20 DIAGNOSIS — Z794 Long term (current) use of insulin: Secondary | ICD-10-CM

## 2024-04-20 DIAGNOSIS — Z808 Family history of malignant neoplasm of other organs or systems: Secondary | ICD-10-CM

## 2024-04-20 DIAGNOSIS — E1169 Type 2 diabetes mellitus with other specified complication: Secondary | ICD-10-CM | POA: Diagnosis present

## 2024-04-20 DIAGNOSIS — Z888 Allergy status to other drugs, medicaments and biological substances status: Secondary | ICD-10-CM

## 2024-04-20 DIAGNOSIS — G893 Neoplasm related pain (acute) (chronic): Secondary | ICD-10-CM | POA: Diagnosis present

## 2024-04-20 DIAGNOSIS — Z79891 Long term (current) use of opiate analgesic: Secondary | ICD-10-CM

## 2024-04-20 DIAGNOSIS — I110 Hypertensive heart disease with heart failure: Secondary | ICD-10-CM | POA: Diagnosis present

## 2024-04-20 DIAGNOSIS — C541 Malignant neoplasm of endometrium: Secondary | ICD-10-CM | POA: Diagnosis present

## 2024-04-20 DIAGNOSIS — E871 Hypo-osmolality and hyponatremia: Secondary | ICD-10-CM | POA: Diagnosis present

## 2024-04-20 DIAGNOSIS — D72825 Bandemia: Secondary | ICD-10-CM | POA: Diagnosis present

## 2024-04-20 DIAGNOSIS — I639 Cerebral infarction, unspecified: Secondary | ICD-10-CM

## 2024-04-20 DIAGNOSIS — G4733 Obstructive sleep apnea (adult) (pediatric): Secondary | ICD-10-CM | POA: Diagnosis present

## 2024-04-20 DIAGNOSIS — Z8249 Family history of ischemic heart disease and other diseases of the circulatory system: Secondary | ICD-10-CM

## 2024-04-20 DIAGNOSIS — I10 Essential (primary) hypertension: Secondary | ICD-10-CM | POA: Diagnosis present

## 2024-04-20 DIAGNOSIS — R2990 Unspecified symptoms and signs involving the nervous system: Secondary | ICD-10-CM | POA: Diagnosis present

## 2024-04-20 DIAGNOSIS — E876 Hypokalemia: Secondary | ICD-10-CM | POA: Diagnosis present

## 2024-04-20 DIAGNOSIS — C772 Secondary and unspecified malignant neoplasm of intra-abdominal lymph nodes: Secondary | ICD-10-CM | POA: Diagnosis present

## 2024-04-20 LAB — BASIC METABOLIC PANEL WITH GFR
Anion gap: 17 — ABNORMAL HIGH (ref 5–15)
BUN: 14 mg/dL (ref 6–20)
CO2: 19 mmol/L — ABNORMAL LOW (ref 22–32)
Calcium: 8.6 mg/dL — ABNORMAL LOW (ref 8.9–10.3)
Chloride: 96 mmol/L — ABNORMAL LOW (ref 98–111)
Creatinine, Ser: 1.04 mg/dL — ABNORMAL HIGH (ref 0.44–1.00)
GFR, Estimated: >60 mL/min (ref >60)
Glucose, Bld: 270 mg/dL — ABNORMAL HIGH (ref 70–99)
Potassium: 3.2 mmol/L — ABNORMAL LOW (ref 3.5–5.1)
Sodium: 132 mmol/L — ABNORMAL LOW (ref 135–145)

## 2024-04-20 LAB — CBC
HCT: 32.2 % — ABNORMAL LOW (ref 36.0–46.0)
Hemoglobin: 10.3 g/dL — ABNORMAL LOW (ref 12.0–15.0)
MCH: 27 pg (ref 26.0–34.0)
MCHC: 32 g/dL (ref 30.0–36.0)
MCV: 84.3 fL (ref 80.0–100.0)
Platelets: 334 K/uL (ref 150–400)
RBC: 3.82 MIL/uL — ABNORMAL LOW (ref 3.87–5.11)
RDW: 13.5 % (ref 11.5–15.5)
WBC: 20.8 K/uL — ABNORMAL HIGH (ref 4.0–10.5)
nRBC: 0 % (ref 0.0–0.2)

## 2024-04-20 LAB — COMPREHENSIVE METABOLIC PANEL WITH GFR
ALT: 12 U/L (ref 0–44)
AST: 15 U/L (ref 15–41)
Albumin: 2.5 g/dL — ABNORMAL LOW (ref 3.5–5.0)
Alkaline Phosphatase: 134 U/L — ABNORMAL HIGH (ref 38–126)
Anion gap: 22 — ABNORMAL HIGH (ref 5–15)
BUN: 11 mg/dL (ref 6–20)
CO2: 15 mmol/L — ABNORMAL LOW (ref 22–32)
Calcium: 8.5 mg/dL — ABNORMAL LOW (ref 8.9–10.3)
Chloride: 95 mmol/L — ABNORMAL LOW (ref 98–111)
Creatinine, Ser: 1 mg/dL (ref 0.44–1.00)
GFR, Estimated: >60 mL/min (ref >60)
Glucose, Bld: 319 mg/dL — ABNORMAL HIGH (ref 70–99)
Potassium: 4 mmol/L (ref 3.5–5.1)
Sodium: 132 mmol/L — ABNORMAL LOW (ref 135–145)
Total Bilirubin: 2 mg/dL — ABNORMAL HIGH (ref 0.0–1.2)
Total Protein: 6.8 g/dL (ref 6.5–8.1)

## 2024-04-20 LAB — BLOOD GAS, VENOUS
Acid-base deficit: 3.4 mmol/L — ABNORMAL HIGH (ref 0.0–2.0)
Bicarbonate: 20.3 mmol/L (ref 20.0–28.0)
Drawn by: 2335
O2 Saturation: 85.4 %
Patient temperature: 36.4
pCO2, Ven: 31 mmHg — ABNORMAL LOW (ref 44–60)
pH, Ven: 7.42 (ref 7.25–7.43)
pO2, Ven: 50 mmHg — ABNORMAL HIGH (ref 32–45)

## 2024-04-20 LAB — I-STAT CHEM 8, ED
BUN: 12 mg/dL (ref 6–20)
Calcium, Ion: 0.98 mmol/L — ABNORMAL LOW (ref 1.15–1.40)
Chloride: 96 mmol/L — ABNORMAL LOW (ref 98–111)
Creatinine, Ser: 0.5 mg/dL (ref 0.44–1.00)
Glucose, Bld: 326 mg/dL — ABNORMAL HIGH (ref 70–99)
HCT: 32 % — ABNORMAL LOW (ref 36.0–46.0)
Hemoglobin: 10.9 g/dL — ABNORMAL LOW (ref 12.0–15.0)
Potassium: 3.9 mmol/L (ref 3.5–5.1)
Sodium: 131 mmol/L — ABNORMAL LOW (ref 135–145)
TCO2: 17 mmol/L — ABNORMAL LOW (ref 22–32)

## 2024-04-20 LAB — GLUCOSE, CAPILLARY
Glucose-Capillary: 260 mg/dL — ABNORMAL HIGH (ref 70–99)
Glucose-Capillary: 267 mg/dL — ABNORMAL HIGH (ref 70–99)
Glucose-Capillary: 339 mg/dL — ABNORMAL HIGH (ref 70–99)

## 2024-04-20 LAB — BETA-HYDROXYBUTYRIC ACID: Beta-Hydroxybutyric Acid: 5.19 mmol/L — ABNORMAL HIGH (ref 0.05–0.27)

## 2024-04-20 LAB — CBG MONITORING, ED: Glucose-Capillary: 314 mg/dL — ABNORMAL HIGH (ref 70–99)

## 2024-04-20 LAB — URINALYSIS, ROUTINE W REFLEX MICROSCOPIC
Bilirubin Urine: NEGATIVE
Glucose, UA: 50 mg/dL — AB
Ketones, ur: 80 mg/dL — AB
Leukocytes,Ua: NEGATIVE
Nitrite: NEGATIVE
Protein, ur: NEGATIVE mg/dL
Specific Gravity, Urine: 1.036 — ABNORMAL HIGH (ref 1.005–1.030)
pH: 6 (ref 5.0–8.0)

## 2024-04-20 LAB — DIFFERENTIAL
Abs Immature Granulocytes: 0.19 K/uL — ABNORMAL HIGH (ref 0.00–0.07)
Basophils Absolute: 0.1 K/uL (ref 0.0–0.1)
Basophils Relative: 0 %
Eosinophils Absolute: 0 K/uL (ref 0.0–0.5)
Eosinophils Relative: 0 %
Immature Granulocytes: 1 %
Lymphocytes Relative: 5 %
Lymphs Abs: 1 K/uL (ref 0.7–4.0)
Monocytes Absolute: 0.6 K/uL (ref 0.1–1.0)
Monocytes Relative: 3 %
Neutro Abs: 18.9 K/uL — ABNORMAL HIGH (ref 1.7–7.7)
Neutrophils Relative %: 91 %

## 2024-04-20 LAB — PROTIME-INR
INR: 1.2 (ref 0.8–1.2)
Prothrombin Time: 16.1 s — ABNORMAL HIGH (ref 11.4–15.2)

## 2024-04-20 LAB — LACTIC ACID, PLASMA: Lactic Acid, Venous: 3 mmol/L (ref 0.5–1.9)

## 2024-04-20 LAB — ETHANOL: Alcohol, Ethyl (B): <15 mg/dL (ref <15)

## 2024-04-20 LAB — APTT: aPTT: 27 s (ref 24–36)

## 2024-04-20 MED ORDER — POLYETHYLENE GLYCOL 3350 17 G PO PACK
17.0000 g | PACK | Freq: Every day | ORAL | Status: DC | PRN
  Administered 2024-04-20 – 2024-04-23 (×2): 17 g via ORAL
  Filled 2024-04-20 (×2): qty 1

## 2024-04-20 MED ORDER — PRASUGREL HCL 5 MG PO TABS: 5.0000 mg | ORAL_TABLET | Freq: Every morning | ORAL | Status: DC

## 2024-04-20 MED ORDER — IOHEXOL 350 MG/ML SOLN
100.0000 mL | Freq: Once | INTRAVENOUS | Status: AC | PRN
  Administered 2024-04-20: 100 mL via INTRAVENOUS

## 2024-04-20 MED ORDER — ASPIRIN 325 MG PO TABS
325.0000 mg | ORAL_TABLET | Freq: Every day | ORAL | Status: DC
  Administered 2024-04-21: 325 mg via ORAL
  Filled 2024-04-20: qty 1

## 2024-04-20 MED ORDER — SODIUM CHLORIDE 0.9% FLUSH
3.0000 mL | Freq: Two times a day (BID) | INTRAVENOUS | Status: DC
  Administered 2024-04-20 – 2024-04-24 (×8): 3 mL via INTRAVENOUS

## 2024-04-20 MED ORDER — ACETAMINOPHEN 325 MG PO TABS
650.0000 mg | ORAL_TABLET | Freq: Four times a day (QID) | ORAL | Status: DC | PRN
  Administered 2024-04-22: 650 mg via ORAL
  Filled 2024-04-20: qty 2

## 2024-04-20 MED ORDER — INSULIN GLARGINE 100 UNIT/ML ~~LOC~~ SOLN
10.0000 [IU] | Freq: Every day | SUBCUTANEOUS | Status: DC
  Filled 2024-04-20: qty 0.1

## 2024-04-20 MED ORDER — MORPHINE SULFATE ER 15 MG PO TBCR
15.0000 mg | EXTENDED_RELEASE_TABLET | Freq: Every day | ORAL | Status: DC
  Administered 2024-04-21 – 2024-04-23 (×3): 15 mg via ORAL
  Filled 2024-04-20 (×3): qty 1

## 2024-04-20 MED ORDER — DROPERIDOL 2.5 MG/ML IJ SOLN
0.6250 mg | Freq: Once | INTRAMUSCULAR | Status: AC
  Administered 2024-04-20: 0.625 mg via INTRAVENOUS
  Filled 2024-04-20: qty 2

## 2024-04-20 MED ORDER — INSULIN GLARGINE-YFGN 100 UNIT/ML ~~LOC~~ SOLN: 10.0000 [IU] | Freq: Every day | SUBCUTANEOUS | Status: DC

## 2024-04-20 MED ORDER — HYDROMORPHONE HCL 1 MG/ML IJ SOLN
0.5000 mg | INTRAMUSCULAR | Status: DC | PRN
  Administered 2024-04-21: 0.5 mg via INTRAVENOUS
  Filled 2024-04-20: qty 0.5

## 2024-04-20 MED ORDER — OXYCODONE HCL 5 MG PO TABS
10.0000 mg | ORAL_TABLET | ORAL | Status: DC | PRN
  Administered 2024-04-20 (×2): 10 mg via ORAL
  Filled 2024-04-20 (×2): qty 2

## 2024-04-20 MED ORDER — POTASSIUM CHLORIDE 10 MEQ/100ML IV SOLN
10.0000 meq | INTRAVENOUS | Status: AC
  Administered 2024-04-20 – 2024-04-21 (×4): 10 meq via INTRAVENOUS
  Filled 2024-04-20 (×4): qty 100

## 2024-04-20 MED ORDER — BUPROPION HCL ER (XL) 150 MG PO TB24
150.0000 mg | ORAL_TABLET | Freq: Every morning | ORAL | Status: DC
  Administered 2024-04-22 – 2024-04-24 (×3): 150 mg via ORAL
  Filled 2024-04-20 (×3): qty 1

## 2024-04-20 MED ORDER — POTASSIUM CHLORIDE CRYS ER 20 MEQ PO TBCR
40.0000 meq | EXTENDED_RELEASE_TABLET | Freq: Once | ORAL | Status: AC
  Administered 2024-04-20: 40 meq via ORAL
  Filled 2024-04-20: qty 2

## 2024-04-20 MED ORDER — SODIUM CHLORIDE 0.9% FLUSH
3.0000 mL | Freq: Once | INTRAVENOUS | Status: AC
  Administered 2024-04-20: 3 mL via INTRAVENOUS

## 2024-04-20 MED ORDER — PREGABALIN 100 MG PO CAPS
100.0000 mg | ORAL_CAPSULE | Freq: Three times a day (TID) | ORAL | Status: DC
  Administered 2024-04-20: 100 mg via ORAL
  Filled 2024-04-20: qty 1

## 2024-04-20 MED ORDER — ACETAMINOPHEN 650 MG RE SUPP: 650.0000 mg | Freq: Four times a day (QID) | RECTAL | Status: DC | PRN

## 2024-04-20 MED ORDER — FLUOXETINE HCL 20 MG PO CAPS
40.0000 mg | ORAL_CAPSULE | Freq: Every day | ORAL | Status: DC
  Administered 2024-04-21 – 2024-04-24 (×4): 40 mg via ORAL
  Filled 2024-04-20 (×4): qty 2

## 2024-04-20 MED ORDER — INSULIN ASPART 100 UNIT/ML IJ SOLN
0.0000 [IU] | Freq: Three times a day (TID) | INTRAMUSCULAR | Status: DC
  Administered 2024-04-20: 11 [IU] via SUBCUTANEOUS

## 2024-04-20 MED ORDER — ATORVASTATIN CALCIUM 80 MG PO TABS
80.0000 mg | ORAL_TABLET | Freq: Every evening | ORAL | Status: DC
  Administered 2024-04-20 – 2024-04-23 (×4): 80 mg via ORAL
  Filled 2024-04-20: qty 1
  Filled 2024-04-20: qty 2
  Filled 2024-04-20 (×2): qty 1

## 2024-04-20 MED ORDER — LACTATED RINGERS IV BOLUS
20.0000 mL/kg | Freq: Once | INTRAVENOUS | Status: AC
  Administered 2024-04-20: 1732 mL via INTRAVENOUS

## 2024-04-20 NOTE — ED Notes (Signed)
 Patient still restless and repeating I can't do this. Pt still with expressive aphasia. This RN has asked EDP to see patient at bedside. MD and DO resident at bedside with patient. Despite, droperidol  administration, patient remains restless. MRI postponed at this time.

## 2024-04-20 NOTE — ED Notes (Signed)
 CCMD contacted to place the patient on cardiac monitoring services.

## 2024-04-20 NOTE — ED Notes (Signed)
 MD at bedside.

## 2024-04-20 NOTE — H&P (Signed)
 History and Physical   Brenda [name] FMW:[id number] DOB: 1960-12-23 DOA: 04/20/2024  PCP: Lonn Hicks, MD   Patient coming from: Home  Chief Complaint: Aphasia  HPI: Brenda [name] is a 63 y.o. female with medical history significant of hypertension, lipidemia, diabetes, CAD, CHF, anxiety, depression, obesity, OSA, chronic respiratory failure, endometrial carcinoma presenting with aphasia.  Patient presented as a code stroke.  Patient last normal around 3:30 AM.  Husband noted this afternoon the patient was not speaking normally.  Patient reports ongoing issues with back pain.  No reports of fever, chills, chest pain, shortness of breath, abdominal pain, constipation, diarrhea, somewhat unreliable with intermittent confusion.    ED Course: Vital signs in the ED notable for heart rate in the 90s-100s, respirate in the 20s.  Lab workup included CMP with sodium 132, chloride 95, bicarb 15, gap 22, glucose 09/05/2017, calcium  8.5, albumin 2.5, alk phos 134, T. bili 2.0.  CBC with leukocytosis to 20.8, hemoglobin stable at 10.3.  PT 16.1, PTT and INR normal.  Ethanol level negative.  CT head showed no acute abnormality.  CTA head and neck showed no intracranial LVO nor high-grade stenosis.  Also noted patent carotid arteries and basilar artery.  Incidentally imaged lung fields show improvement in a prior lung nodule now with only faint groundglass opacity.  A second nodule is stable.  MRI brain ordered and is pending.  Patient received droperidol  for mild sedation to attempt MRI.  Neurology consulted and are following with recommendation for MRI and further workup pending results of this.  Review of Systems: As per HPI otherwise all other systems reviewed and are negative.  Past Medical History:  Diagnosis Date   Anemia    Arthritis    back, hands, hips (02/22/2015)   CAD (coronary artery disease)    a. complex LAD/diagonal bifurcation PCI in 2010. b. STEMI 10/2019 s/p PTCA/DES  x1 to mLAD overlapping the old stent, residual disease treated medicaly.   Cancer [facility])    uterine   CHF (congestive heart failure) (HCC)    Depression    Diabetes mellitus (HCC)    started when I was pregnant; not sure if it was type 1 or type 2    History of radiation therapy    Endometrium- HDR 01/22/22-03/07/22- Dr. Lynwood Nasuti   Hypercholesterolemia    Hypertension    Morbid obesity (HCC)    Myocardial infarction (HCC)    mild x 3   Neuromuscular disorder (HCC)    neuropathy feet   Sleep apnea    cpap/    Past Surgical History:  Procedure Laterality Date   CARDIAC CATHETERIZATION  12/02/2012   CHOLECYSTECTOMY OPEN  09/04/1987   CORONARY ANGIOPLASTY WITH STENT PLACEMENT  09/03/2009   CORONARY/GRAFT ACUTE MI REVASCULARIZATION N/A 10/26/2019   Procedure: CORONARY/GRAFT ACUTE MI REVASCULARIZATION;  Surgeon: Verlin Lonni BIRCH, MD;  Location: [facility] INVASIVE CV LAB;  Service: Cardiovascular;  Laterality: N/A;   INCISIONAL HERNIA REPAIR N/A 12/09/2021   Procedure: LAPAROSCOPIC REPAIR UMBILICAL HERNIA WITH MESH;  Surgeon: Vernetta Berg, MD;  Location: [facility] ORS;  Service: General;  Laterality: N/A;   IR IMAGING GUIDED PORT INSERTION  04/08/2024   LEFT HEART CATH AND CORONARY ANGIOGRAPHY N/A 10/26/2019   Procedure: LEFT HEART CATH AND CORONARY ANGIOGRAPHY;  Surgeon: Verlin Lonni BIRCH, MD;  Location: [facility] INVASIVE CV LAB;  Service: Cardiovascular;  Laterality: N/A;   LEFT HEART CATHETERIZATION WITH CORONARY ANGIOGRAM N/A 12/25/2012   Procedure: LEFT HEART CATHETERIZATION WITH CORONARY ANGIOGRAM;  Surgeon:  Ozell Fell, MD;  Location: [facility] CATH LAB;  Service: Cardiovascular;  Laterality: N/A;   ROBOTIC ASSISTED TOTAL HYSTERECTOMY WITH BILATERAL SALPINGO OOPHERECTOMY N/A 12/06/2021   Procedure: XI ROBOTIC ASSISTED TOTAL HYSTERECTOMY WITH BILATERAL SALPINGO OOPHORECTOMY;  Surgeon: [name] SAUNDERS, MD;  Location: [facility] ORS;  Service: Gynecology;  Laterality: N/A;   UMBILICAL HERNIA  REPAIR  05/04/1989   doesn't think they used mesh    Social History  reports that she quit smoking about 37 years ago. Her smoking use included cigarettes and cigars. She started smoking about 38 years ago. She has a 0.5 pack-year smoking history. She has never used smokeless tobacco. She reports that she does not drink alcohol and does not use drugs.  Allergies  Allergen Reactions   Hydrocodone Shortness Of Breath, Dermatitis and Other (See Comments)    I forget to breathe.   Clopidogrel Rash and Dermatitis    Family History  Problem Relation Age of Onset   Coronary artery disease Mother        in her mid 4s   Hypertension Mother    Cancer Mother        skin   Heart attack Father        in his mid 65s   COPD Father    Stroke Sister    Coronary artery disease Sister    Diabetes Sister    Other Half-Sister    Thyroid  cancer Daughter    Prostate cancer Neg Hx    Colon cancer Neg Hx    Breast cancer Neg Hx    Endometrial cancer Neg Hx    Ovarian cancer Neg Hx    Pancreatic cancer Neg Hx   Reviewed on admission Prior to Admission medications   Medication Sig Start Date End Date Taking? Authorizing Provider  acetaminophen  (TYLENOL ) 500 MG tablet Take 1-2 tablets (500-1,000 mg total) by mouth every 8 (eight) hours as needed for mild pain (pain score 1-3) or moderate pain (pain score 4-6). 03/30/24 03/30/25  Hongalgi, Anand D, MD  atorvastatin  (LIPITOR ) 80 MG tablet Take 1 tablet (80 mg total) by mouth every evening. 10/03/21   Newlin, Enobong, MD  buPROPion  (WELLBUTRIN  XL) 150 MG 24 hr tablet Take 150 mg by mouth in the morning.    [provider]  Cholecalciferol  (VITAMIN D3 SUPER STRENGTH) 50 MCG (2000 UT) CAPS Take 2,000 Units by mouth in the morning.    [provider]  Continuous Glucose Sensor (FREESTYLE LIBRE 3 SENSOR) MISC Inject 1 Device into the skin every 14 (fourteen) days.    [provider]  FIASP  FLEXTOUCH 100 UNIT/ML FlexTouch Pen Inject  5-8 Units into the skin See admin instructions. Inject 5 units into the skin before breakfast & lunch and 8 units before supper/evening meal 12/08/22   [provider]  FLUoxetine  (PROZAC ) 40 MG capsule Take 40 mg by mouth daily. 01/17/23   [provider]  furosemide  (LASIX ) 40 MG tablet Take 1 tablet by mouth once daily 02/05/22   Newlin, Enobong, MD  insulin  degludec (TRESIBA) 100 UNIT/ML FlexTouch Pen Inject 20 Units into the skin every evening.    [provider]  lidocaine -prilocaine  (EMLA ) cream Apply to affected area once 04/16/24   Lonn Hicks, MD  losartan  (COZAAR ) 25 MG tablet Take 1 tablet (25 mg total) by mouth daily. 07/19/23   Cooper, Michael, MD  metFORMIN (GLUCOPHAGE-XR) 500 MG 24 hr tablet Take 1,000 mg by mouth in the morning and at bedtime. 07/05/22   [provider]  Misc. Devices MISC Portable oxygen  concentrator, 2L oxygen .  Diagnoses-chronic respiratory failure with hypoxia 11/27/21   Newlin, Enobong, MD  morphine  (MS CONTIN ) 15 MG 12 hr tablet Take 1 tablet (15 mg total) by mouth at bedtime. 04/16/24   Lonn Hicks, MD  nitroGLYCERIN  (NITROSTAT ) 0.4 MG SL tablet Dissolve 1 tablet under the tongue every 5 minutes as needed for chest pain. Max of 3 doses, then 911. 01/25/23   Lelon Hamilton T, PA-C  ondansetron  (ZOFRAN ) 8 MG tablet Take 1 tablet (8 mg total) by mouth every 8 (eight) hours as needed for nausea or vomiting. Start on the third day after carboplatin. 04/16/24   Lonn Hicks, MD  Oxycodone  HCl 10 MG TABS Take 1 tablet (10 mg total) by mouth every 4 (four) hours as needed for severe pain (pain score 7-10). 04/16/24   Lonn Hicks, MD  OXYGEN  Inhale 2 L/min into the lungs continuous.    [provider]  polyethylene glycol powder (GLYCOLAX /MIRALAX ) 17 GM/SCOOP powder Take 17 g by mouth daily as needed for mild constipation. 09/17/23   [provider]  prasugrel  (EFFIENT ) 5 MG TABS tablet Take 1 tablet (5 mg total) by mouth in the  morning. AFTER TAKING YOUR DOSE ON 04/07/2024, STOP TAKING IT IN PREPARATION FOR THE BIOPSY ON 04/15/2024. AFTER THE BIOPSY, FOLLOW THE RADIOLOGIST RECOMMENDATIONS AS TO TIMING OF RESTARTING THIS MEDICINE. 03/30/24   Judeth Trenda BIRCH, MD  pregabalin  (LYRICA ) 100 MG capsule Take 1 capsule (100 mg total) by mouth 3 (three) times daily. 02/05/24   Christine Rush, DPM  prochlorperazine  (COMPAZINE ) 10 MG tablet Take 1 tablet (10 mg total) by mouth every 6 (six) hours as needed for nausea or vomiting. 04/16/24   Lonn Hicks, MD  Respiratory Therapy Supplies (CARETOUCH 2 CPAP HOSE HANGER) MISC daily at night    [provider]    Physical Exam: Vitals:   04/20/24 1300 04/20/24 1405 04/20/24 1410 04/20/24 1430  BP:  121/78  124/80  Pulse:  94  (!) 101  Resp:  20 (!) 40 (!) 26  Temp:  (!) 97.5 F (36.4 C)    TempSrc:  Oral    SpO2:  100%  100%  Weight: 86.6 kg       Physical Exam Constitutional:      General: She is not in acute distress.    Appearance: Normal appearance.  HENT:     Head: Normocephalic and atraumatic.     Mouth/Throat:     Mouth: Mucous membranes are moist.     Pharynx: Oropharynx is clear.  Eyes:     Extraocular Movements: Extraocular movements intact.     Pupils: Pupils are equal, round, and reactive to light.  Cardiovascular:     Rate and Rhythm: Normal rate and regular rhythm.     Pulses: Normal pulses.     Heart sounds: Normal heart sounds.  Pulmonary:     Effort: Pulmonary effort is normal. No respiratory distress.     Breath sounds: Normal breath sounds.  Abdominal:     General: Bowel sounds are normal. There is no distension.     Palpations: Abdomen is soft.     Tenderness: There is no abdominal tenderness.  Musculoskeletal:        General: No swelling or deformity.  Skin:    General: Skin is warm and dry.  Neurological:     Comments: Mental Status: Patient is awake, alert Noted to have word finding difficulty and some intermittent confusion. Cranial  Nerves: II: Pupils equal, round, and reactive to light.   III,IV, VI: EOMI without ptosis or diploplia.  V: Facial sensation is symmetric to light touch. VII: Facial movement is symmetric.  VIII: hearing is intact to voice X: Uvula elevates symmetrically XI: Shoulder shrug is symmetric. XII: tongue is midline without atrophy or fasciculations.  Motor: Good effort thorughout, at Least 5/5 bilateral UE, 5/5 bilateral lower extremitiy  Sensory: Sensation is grossly intact bilateral UEs & LEs Cerebellar: Finger-Nose unable to be evaluated due to patient's difficulty performing exam and over    Labs on Admission: I have personally reviewed following labs and imaging studies  CBC: Recent Labs  Lab 04/20/24 1344 04/20/24 1351  WBC 20.8*  --   NEUTROABS 18.9*  --   HGB 10.3* 10.9*  HCT 32.2* 32.0*  MCV 84.3  --   PLT 334  --     Basic Metabolic Panel: Recent Labs  Lab 04/20/24 1344 04/20/24 1351  NA 132* 131*  K 4.0 3.9  CL 95* 96*  CO2 15*  --   GLUCOSE 319* 326*  BUN 11 12  CREATININE 1.00 0.50  CALCIUM  8.5*  --     GFR: Estimated Creatinine Clearance: 76.4 mL/min (by C-G formula based on SCr of 0.5 mg/dL).  Liver Function Tests: Recent Labs  Lab 04/20/24 1344  AST 15  ALT 12  ALKPHOS 134*  BILITOT 2.0*  PROT 6.8  ALBUMIN 2.5*    Urine analysis:    Component Value Date/Time   COLORURINE YELLOW 02/08/2021 1018   APPEARANCEUR HAZY (A) 02/08/2021 1018   LABSPEC 1.006 02/08/2021 1018   PHURINE 5.0 02/08/2021 1018   GLUCOSEU NEGATIVE 02/08/2021 1018   HGBUR SMALL (A) 02/08/2021 1018   BILIRUBINUR NEGATIVE 02/08/2021 1018   BILIRUBINUR neg 08/16/2016 1147   KETONESUR NEGATIVE 02/08/2021 1018   PROTEINUR NEGATIVE 02/08/2021 1018   UROBILINOGEN 2.0 08/16/2016 1147   UROBILINOGEN 2.0 (H) 02/22/2015 2143   NITRITE NEGATIVE 02/08/2021 1018   LEUKOCYTESUR TRACE (A) 02/08/2021 1018    Radiological Exams on Admission: CT ANGIO HEAD NECK W WO CM W PERF (CODE  STROKE) Result Date: 04/20/2024 CLINICAL DATA:  Provided history: Neuro deficit, acute, stroke suspected. EXAM: CT ANGIOGRAPHY HEAD AND NECK CT PERFUSION BRAIN TECHNIQUE: Multidetector CT imaging of the head and neck was performed using the standard protocol during bolus administration of intravenous contrast. Multiplanar CT image reconstructions and MIPs were obtained to evaluate the vascular anatomy. Carotid stenosis measurements (when applicable) are obtained utilizing NASCET criteria, using the distal internal carotid diameter as the denominator. Multiphase CT imaging of the brain was performed following IV bolus contrast injection. Subsequent parametric perfusion maps were calculated using RAPID software. RADIATION DOSE REDUCTION: This exam was performed according to the departmental dose-optimization program which includes automated exposure control, adjustment of the mA and/or kV according to patient size and/or use of iterative reconstruction technique. CONTRAST:  OMNIPAQUE  IOHEXOL  350 MG/ML SOLN COMPARISON:  Noncontrast head CT performed earlier today 04/20/2024. CT angiogram head 05/31/2016. Chest CT 04/03/2024. FINDINGS: CTA NECK FINDINGS Aortic arch: Standard aortic branching. Atherosclerotic plaque within the aortic arch and proximal major branch vessels of the neck. Streak/beam hardening artifact arising from a dense contrast bolus partially obscures the right subclavian artery. Within this limitation, there is no appreciable hemodynamically significant innominate or proximal subclavian artery stenosis. Right carotid system: CCA and ICA patent within the neck without stenosis. Mild calcified atherosclerotic plaque about the carotid bifurcation. Left carotid system: CCA and ICA patent  within the neck without stenosis. Mild calcified plaque within the proximal ICA. Vertebral arteries: Patent within the neck without stenosis or significant atherosclerotic disease. The right vertebral artery is  dominant. Skeleton: The neck bilaterally. No acute fracture or aggressive osseous lesion. Other neck: Subcentimeter right thyroid  lobe nodule not meeting consensus criteria for ultrasound follow-up based on size. No follow-up imaging recommended. Reference: J Am Coll Radiol. 2015 Feb;12(2): 143-50. Mild nonspecific edema within Upper chest: Only faint residual ground-glass opacity remains at site of the 12 mm right upper lobe pulmonary nodule described on the recent prior chest CT of 04/03/2024 (series 5, image 341). A 6 mm part solid nodule more medially within the right upper lobe is unchanged (series 5, image 335). Right chest infusion port catheter. Review of the MIP images confirms the above findings CTA HEAD FINDINGS Anterior circulation: The intracranial internal carotid arteries are patent. Non-stenotic atherosclerotic plaque within both vessels. The M1 middle cerebral arteries are patent. No M2 proximal branch occlusion or high-grade proximal stenosis. The anterior cerebral arteries are patent. No intracranial aneurysm is identified. Posterior circulation: The intracranial vertebral arteries are patent. The basilar artery is patent. The posterior cerebral arteries are patent. Posterior communicating arteries are diminutive or absent, bilaterally. Venous sinuses: Within the limitations of contrast timing, no convincing thrombus. Anatomic variants: As described. Review of the MIP images confirms the above findings CT Brain Perfusion Findings: Poor source data/bolus quality limits reliability of the perfusion estimates. Within this limitation, findings are as follows. CBF (<30%) Volume: 0mL Perfusion (Tmax>6.0s) volume: 8 mL (right temporal lobe). Mismatch Volume: 8mL Infarction Location:None identified. No emergent large vessel occlusion identified. These results were communicated to Dr. Michaela at 2:26 pmon 8/18/2025by text page via the [facility] messaging system. IMPRESSION: CTA neck: 1. The common carotid  and internal carotid arteries are patent within the neck without stenosis. Mild atherosclerotic plaque bilaterally, as described. 2. The vertebral arteries are patent within the neck without stenosis or significant atherosclerotic disease. 3. Aortic Atherosclerosis (ICD10-I70.0). 4. Only faint residual ground-glass opacity remains at site of the 12 mm right upper lobe pulmonary nodule described on the recent prior chest CT of 04/03/2024. 5. A 6 mm part-solid nodule more medially within the right upper lobe is unchanged. CTA head: 1. No proximal intracranial large vessel occlusion or high-grade proximal arterial stenosis is identified. 2. Non-stenotic atherosclerotic plaque within the intracranial ICAs. CT perfusion head: Poor source data/bolus quality limits reliability of the perfusion estimates. Within this limitation, findings are as follows. The perfusion software identifies no core infarct. The perfusion software identifies an 8 mL region of hypoperfusion in the right temporal lobe (utilizing the Tmax>6 seconds threshold). Reported mismatch volume: 8 mL. Electronically Signed   By: Rockey Childs D.O.   On: 04/20/2024 14:27   CT HEAD CODE STROKE WO CONTRAST Result Date: 04/20/2024 CLINICAL DATA:  Provided history: Code stroke. Neuro deficit, acute, stroke suspected. EXAM: CT HEAD WITHOUT CONTRAST TECHNIQUE: Contiguous axial images were obtained from the base of the skull through the vertex without intravenous contrast. RADIATION DOSE REDUCTION: This exam was performed according to the departmental dose-optimization program which includes automated exposure control, adjustment of the mA and/or kV according to patient size and/or use of iterative reconstruction technique. COMPARISON:  CT angiogram head 05/31/2016. FINDINGS: Brain: Mild generalized cerebral atrophy. There is no acute intracranial hemorrhage. No demarcated cortical infarct. No extra-axial fluid collection. No evidence of an intracranial mass. No  midline shift. Vascular: No hyperdense vessel.  Atherosclerotic calcifications. Skull: No  calvarial fracture or aggressive osseous lesion. Sinuses/Orbits: No mass or acute finding within the imaged orbits. Opacification of a single right ethmoid air cell. ASPECTS [facility] Stroke Program Early CT Score) - Ganglionic level infarction (caudate, lentiform nuclei, internal capsule, insula, M1-M3 cortex): 7 - Supraganglionic infarction (M4-M6 cortex): 3 Total score (0-10 with 10 being normal): 10 No evidence of an acute intracranial abnormality. These results were communicated to Dr. Michaela at 1:58 pmon 8/18/2025by text page via the [facility] messaging system. IMPRESSION: 1.  No evidence of an acute intracranial abnormality. 2. Mild generalized cerebral atrophy. 3. Mild right ethmoid sinusitis. Electronically Signed   By: Rockey Childs D.O.   On: 04/20/2024 14:00   EKG: Not performed in the emergency department  Assessment/Plan Principal Problem:   Acute focal neurological deficit Active Problems:   Hyperlipidemia associated with type 2 diabetes mellitus (HCC)   Essential hypertension   CAD (coronary artery disease)   OSA treated with BiPAP   Morbid obesity (HCC)   Type 2 diabetes mellitus with complication, with long-term current use of insulin  (HCC)   Anxiety and depression   (HFpEF) heart failure with preserved ejection fraction (HCC)   Chronic respiratory failure (HCC)   Endometrial adenocarcinoma (HCC)   Focal neurologic deficit Intermittent Confusion (Non-global) Rule out CVA > Presenting with new onset aphasia, has some word finding difficulty.  Initially did not recognize her husband.  Able to follow most commands but had issues with coordinating finger-nose exam. > Neurology saw patient when she arrived as a code stroke.  Plan for MRI and further stroke workup pending the results of this. > Is noted to have significant leukocytosis with unclear etiology at this point, possibly reactive but  will continue to evaluate. > CT head and CTA head and neck without acute abnormality.  Attempting MR brain but is needing some medication for this in the ED. Neurology. > Neurology consulted in the ED and are following. - Monitor on telemetry - Appreciate neurology recommendations and assistance - Stroke workup as appropriate  High anion gap metabolic acidosis > Noted to have bicarb 15, gap of 22. > Glucose 319.  Blood pressure stable. - Check ketones and blood/urine - Check lactic acid - Trend renal function and electrolytes  Leukocytosis > Possibly reactive will evaluate for primary etiology. > No infectious etiology on initial CT of the head and neck..  No meningismus.  Afebrile. - Chest x-ray, urinalysis  Hypertension - Permissive hypertension for now  Hyperlipidemia - Continue home atorvastatin   Diabetes > 20 units daily at home - 10 units daily, SSI - May alter plans depending results of ketones as patient does have anion gap lactic acidoss.  CAD - Continue home atorvastatin , Effient  - Holding losartan  as above  CHF > Last echo was last year and showed EF 60-65%, G2 DD, mildly reduced RV function. - Holding Lasix  and losartan  as above  Anxiety and depression - Resume bupropion  and fluoxetine  as able  Obesity - Noted  OSA - Continue CPAP  Chronic respiratory failure with hypoxia - Continue home supplemental oxygen  as needed  Endometrial carcinoma Cancer related pain > Diagnosed in 2023.  Status post hysterectomy and adjuvant radiation.   > Recurrence/Admission last month with severe back pain and imaging showing retroperitoneal lymph node disease and suspicious lung mass.  Biopsy of mass confirm adenocarcinoma. > Follows with oncology, stage IV disease, port has been placed has been educated on chemotherapy with plan to start treatment soon. > Is on oxycodone  and MS Contin   for her cancer pain - Continue home pain medications - As needed Dilaudid  for  breakthrough pain. - Noted  DVT prophylaxis: SCDs for now Code Status:   Full Family Communication:  Updated at bedside  Disposition Plan:   Patient is from:  Home  Anticipated DC to:  Home  Anticipated DC date:  1 to 4 days  Anticipated DC barriers: None  Consults called:  Neurology Admission status:  Observation, telemetry  Severity of Illness: The appropriate patient status for this patient is OBSERVATION. Observation status is judged to be reasonable and necessary in order to provide the required intensity of service to ensure the patient's safety. The patient's presenting symptoms, physical exam findings, and initial radiographic and laboratory data in the context of their medical condition is felt to place them at decreased risk for further clinical deterioration. Furthermore, it is anticipated that the patient will be medically stable for discharge from the hospital within 2 midnights of admission.    Brenda KATHEE Scurry MD [facility]  How to contact the [facility] Attending or Consulting provider 7A - 7P or covering provider during after hours 7P -7A, for this patient?   Check the care team in [facility] and look for a) attending/consulting [facility] provider listed and b) the [facility] team listed Log into [email or web address] and use [place]'s universal password to access. If you do not have the password, please contact the hospital operator. Locate the [facility] provider you are looking for under [facility] and page to a number that you can be directly reached. If you still have difficulty reaching the provider, please page the [facility] (Director on Call) for the Hospitalists listed on amion for assistance.  04/20/2024, 3:08 PM

## 2024-04-20 NOTE — Consult Note (Signed)
 NEUROLOGY CONSULT NOTE   Date of service: April 20, 2024 Patient Name: Leocadia Idleman MRN:  [id number] DOB:  Mar 29, 1961 Chief Complaint: Code stroke Requesting Provider: Tonia Chew, MD  History of Present Illness  Gwenda Heiner is a 63 y.o. female with hx of hypertension, hyperlipidemia, stage IV uterine cancer, diabetes, OSA on CPAP, chronic respiratory failure on home O2 CHF, CAD, CHF who presents from home via [facility].  Code stroke activated by EMS.  Patient had acute onset of difficulty talking or finding words when she woke up this afternoon at 1230.  Last known well 0330 AM when she went to bed in her normal state of health CT head with no acute process.  CTA with no acute process.  NIH 6  LKW: 0330 Modified rankin score: 3-4 IV Thrombolysis: No outside window EVT: No LVO   NIHSS components Score: Comment  1a Level of Conscious 0[x]  1[]  2[]  3[]      1b LOC Questions 0[]  1[]  2[x]       1c LOC Commands 0[x]  1[]  2[]       2 Best Gaze 0[x]  1[]  2[]       3 Visual 0[x]  1[]  2[]  3[]      4 Facial Palsy 0[x]  1[]  2[]  3[]      5a Motor Arm - left 0[x]  1[]  2[]  3[]  4[]  UN[]    5b Motor Arm - Right 0[]  1[x]  2[]  3[]  4[]  UN[]    6a Motor Leg - Left 0[x]  1[]  2[]  3[]  4[]  UN[]    6b Motor Leg - Right 0[x]  1[]  2[]  3[]  4[]  UN[]    7 Limb Ataxia 0[x]  1[]  2[]  UN[]      8 Sensory 0[]  1[x]  2[]  UN[]      9 Best Language 0[]  1[]  2[x]  3[]      10 Dysarthria 0[]  1[]  2[]  UN[]      11 Extinct. and Inattention 0[]  1[]  2[]       TOTAL: 6      ROS  Comprehensive ROS performed and pertinent positives documented in HPI   Past History   Past Medical History:  Diagnosis Date   Anemia    Arthritis    back, hands, hips (02/22/2015)   CAD (coronary artery disease)    a. complex LAD/diagonal bifurcation PCI in 2010. b. STEMI 10/2019 s/p PTCA/DES x1 to mLAD overlapping the old stent, residual disease treated medicaly.   Cancer [facility])    uterine   CHF (congestive heart failure) (HCC)     Depression    Diabetes mellitus (HCC)    started when I was pregnant; not sure if it was type 1 or type 2    History of radiation therapy    Endometrium- HDR 01/22/22-03/07/22- Dr. Lynwood Nasuti   Hypercholesterolemia    Hypertension    Morbid obesity (HCC)    Myocardial infarction (HCC)    mild x 3   Neuromuscular disorder (HCC)    neuropathy feet   Sleep apnea    cpap/    Past Surgical History:  Procedure Laterality Date   CARDIAC CATHETERIZATION  12/02/2012   CHOLECYSTECTOMY OPEN  09/04/1987   CORONARY ANGIOPLASTY WITH STENT PLACEMENT  09/03/2009   CORONARY/GRAFT ACUTE MI REVASCULARIZATION N/A 10/26/2019   Procedure: CORONARY/GRAFT ACUTE MI REVASCULARIZATION;  Surgeon: Verlin Lonni BIRCH, MD;  Location: [facility] INVASIVE [facility];  Service: Cardiovascular;  Laterality: N/A;   INCISIONAL HERNIA REPAIR N/A 12/09/2021   Procedure: LAPAROSCOPIC REPAIR UMBILICAL HERNIA WITH MESH;  Surgeon: Vernetta Berg, MD;  Location: [facility] ORS;  Service: General;  Laterality: N/A;  IR IMAGING GUIDED PORT INSERTION  04/08/2024   LEFT HEART CATH AND CORONARY ANGIOGRAPHY N/A 10/26/2019   Procedure: LEFT HEART CATH AND CORONARY ANGIOGRAPHY;  Surgeon: Verlin Lonni BIRCH, MD;  Location: [facility]VAS[facility] CV [facility];  Service: Cardiovascular;  Laterality: N/A;   LEFT HEART CATHETERIZATION WITH CORONARY ANGIOGRAM N/A 12/25/2012   Procedure: LEFT HEART CATHETERIZATION WITH CORONARY ANGIOGRAM;  Surgeon: Ozell Fell, MD;  Location[facility];  Service: Cardiovascular;  Laterality: N/A;   ROBOTIC ASSISTED TOTAL HYSTERECTOMY WITH BILATERAL SALPINGO OOPHERECTOMY N/A 12/06/2021   Procedure: XI ROBOTIC ASSISTED TOTAL HYSTERECTOMY WITH BILATERAL SALPINGO OOPHORECTOMY;  Surgeon: [name] SAUNDERS, MD;  Location: [facility] ORS;  Service: Gynecology;  Laterality: N/A;   UMBILICAL HERNIA REPAIR  05/04/1989   doesn't think they used mesh    Family History: Family History  Problem Relation Age of Onset   Coronary artery disease  Mother        in her mid 62s   Hypertension Mother    Cancer Mother        skin   Heart attack Father        in his mid 21s   COPD Father    Stroke Sister    Coronary artery disease Sister    Diabetes Sister    Other Half-Sister    Thyroid  cancer Daughter    Prostate cancer Neg Hx    Colon cancer Neg Hx    Breast cancer Neg Hx    Endometrial cancer Neg Hx    Ovarian cancer Neg Hx    Pancreatic cancer Neg Hx     Social History  reports that she quit smoking about 37 years ago. Her smoking use included cigarettes and cigars. She started smoking about 38 years ago. She has a 0.5 pack-year smoking history. She has never used smokeless tobacco. She reports that she does not drink alcohol and does not use drugs.  Allergies  Allergen Reactions   Hydrocodone Shortness Of Breath, Dermatitis and Other (See Comments)    I forget to breathe.   Clopidogrel Rash and Dermatitis    Medications   Current Facility-Administered Medications:    sodium chloride  flush (NS) 0.9 % injection 3 mL, 3 mL, Intravenous, Once, Zavitz, Joshua, MD  Current Outpatient Medications:    acetaminophen  (TYLENOL ) 500 MG tablet, Take 1-2 tablets (500-1,000 mg total) by mouth every 8 (eight) hours as needed for mild pain (pain score 1-3) or moderate pain (pain score 4-6)., Disp: , Rfl:    atorvastatin  (LIPITOR ) 80 MG tablet, Take 1 tablet (80 mg total) by mouth every evening., Disp: 30 tablet, Rfl: 6   buPROPion  (WELLBUTRIN  XL) 150 MG 24 hr tablet, Take 150 mg by mouth in the morning., Disp: , Rfl:    Cholecalciferol  (VITAMIN D3 SUPER STRENGTH) 50 MCG (2000 UT) CAPS, Take 2,000 Units by mouth in the morning., Disp: , Rfl:    Continuous Glucose Sensor (FREESTYLE LIBRE 3 SENSOR) MISC, Inject 1 Device into the skin every 14 (fourteen) days., Disp: , Rfl:    FIASP  FLEXTOUCH 100 UNIT/ML FlexTouch Pen, Inject 5-8 Units into the skin See admin instructions. Inject 5 units into the skin before breakfast & lunch and 8  units before supper/evening meal, Disp: , Rfl:    FLUoxetine  (PROZAC ) 40 MG capsule, Take 40 mg by mouth daily., Disp: , Rfl:    furosemide  (LASIX ) 40 MG tablet, Take 1 tablet by mouth once daily, Disp: 30 tablet, Rfl: 0   insulin  degludec (TRESIBA) 100 UNIT/ML FlexTouch  Pen, Inject 20 Units into the skin every evening., Disp: , Rfl:    lidocaine -prilocaine  (EMLA ) cream, Apply to affected area once, Disp: 30 g, Rfl: 3   losartan  (COZAAR ) 25 MG tablet, Take 1 tablet (25 mg total) by mouth daily., Disp: 90 tablet, Rfl: 3   metFORMIN (GLUCOPHAGE-XR) 500 MG 24 hr tablet, Take 1,000 mg by mouth in the morning and at bedtime., Disp: , Rfl:    Misc. Devices MISC, Portable oxygen  concentrator, 2L oxygen .  Diagnoses-chronic respiratory failure with hypoxia, Disp: 1 each, Rfl: 0   morphine  (MS CONTIN ) 15 MG 12 hr tablet, Take 1 tablet (15 mg total) by mouth at bedtime., Disp: 30 tablet, Rfl: 0   nitroGLYCERIN  (NITROSTAT ) 0.4 MG SL tablet, Dissolve 1 tablet under the tongue every 5 minutes as needed for chest pain. Max of 3 doses, then 911., Disp: 25 tablet, Rfl: 6   ondansetron  (ZOFRAN ) 8 MG tablet, Take 1 tablet (8 mg total) by mouth every 8 (eight) hours as needed for nausea or vomiting. Start on the third day after carboplatin., Disp: 30 tablet, Rfl: 1   Oxycodone  HCl 10 MG TABS, Take 1 tablet (10 mg total) by mouth every 4 (four) hours as needed for severe pain (pain score 7-10)., Disp: 60 tablet, Rfl: 0   OXYGEN , Inhale 2 L/min into the lungs continuous., Disp: , Rfl:    polyethylene glycol powder (GLYCOLAX /MIRALAX ) 17 GM/SCOOP powder, Take 17 g by mouth daily as needed for mild constipation., Disp: , Rfl:    prasugrel  (EFFIENT ) 5 MG TABS tablet, Take 1 tablet (5 mg total) by mouth in the morning. AFTER TAKING YOUR DOSE ON 04/07/2024, STOP TAKING IT IN PREPARATION FOR THE BIOPSY ON 04/15/2024. AFTER THE BIOPSY, FOLLOW THE RADIOLOGIST RECOMMENDATIONS AS TO TIMING OF RESTARTING THIS MEDICINE., Disp: , Rfl:     pregabalin  (LYRICA ) 100 MG capsule, Take 1 capsule (100 mg total) by mouth 3 (three) times daily., Disp: 90 capsule, Rfl: 6   prochlorperazine  (COMPAZINE ) 10 MG tablet, Take 1 tablet (10 mg total) by mouth every 6 (six) hours as needed for nausea or vomiting., Disp: 30 tablet, Rfl: 1   Respiratory Therapy Supplies (CARETOUCH 2 CPAP HOSE HANGER) MISC, daily at night, Disp: , Rfl:   Vitals   Vitals:   04/20/24 1300  Weight: 86.6 kg    Body mass index is 34.92 kg/m.   Physical Exam   Constitutional: Appears well-developed and well-nourished.   Psych: Affect appropriate to situation.   Eyes: No scleral injection.   HENT: No OP obstruction.   Head: Normocephalic.    Cardiovascular: Normal rate and regular rhythm.   Respiratory: Effort normal, non-labored breathing.   GI: Soft.  No distension. There is no tenderness.   Skin: WDI.    Neurologic Examination   Mental Status -  Patient is awake alert distress.  She is expressively aphasic, following commands.  Repeats yes or I do not know to most questions  Cranial Nerves II - XII - II - Visual field intact OU. III, IV, VI - Extraocular movements intact. V - Facial sensation intact bilaterally. VII -questionable right facial droop VIII - Hearing & vestibular intact bilaterally. X - Palate elevates symmetrically. XI - Chin turning & shoulder shrug intact bilaterally. XII - Tongue protrusion intact.  Motor Strength - The patient's strength was normal in all extremities except right upper arm which did have mild drift.  Bulk was normal and fasciculations were absent.   Motor Tone - Muscle tone was  assessed at the neck and appendages and was normal . Sensory -decreased on right Coordination - The patient had normal movements in the hands and feet with no ataxia or dysmetria.  Tremor was absent. Gait and Station - deferred.  Labs/Imaging/Neurodiagnostic studies   CBC:  Recent Labs  Lab 04-30-2024 1344 2024/04/30 1351  WBC 20.8*  --    NEUTROABS 18.9*  --   HGB 10.3* 10.9*  HCT 32.2* 32.0*  MCV 84.3  --   PLT 334  --    Basic Metabolic Panel:  Lab Results  Component Value Date   NA 131 (L) 2024/04/30   K 3.9 April 30, 2024   CO2 24 04/08/2024   GLUCOSE 326 (H) 04/30/24   BUN 12 04-30-2024   CREATININE 0.50 30-Apr-2024   CALCIUM  8.7 (L) 04/08/2024   GFRNONAA >60 04/08/2024   GFRAA 94 12/16/2019   Lipid Panel:  Lab Results  Component Value Date   LDLCALC 40 01/25/2023   HgbA1c:  Lab Results  Component Value Date   HGBA1C 9.2 (H) 03/29/2024    INR  Lab Results  Component Value Date   INR 1.2 04-30-2024   APTT  Lab Results  Component Value Date   APTT 27 2024-04-30   AED levels: No results found for: PHENYTOIN, ZONISAMIDE, LAMOTRIGINE, LEVETIRACETA  CT Head without contrast(Personally reviewed): No acute process, generalized cerebral atrophy  CT angio Head and Neck with contrast(Personally reviewed): No LVO   ASSESSMENT   Bernese Doffing is a 63 y.o. female  hx of hypertension, hyperlipidemia, stage IV uterine cancer, diabetes, OSA on CPAP, chronic respiratory failure on home O2 CHF, CAD, CHF who presents from home via [facility] as a code stroke for onset of difficulty speaking when she awoke and at 12:30 PM.  Given the very focal nature of her aphasia, as well as her multiple stroke risk factors, I have very high concern for cerebral ischemia  RECOMMENDATIONS  - brain MRI w/wo to evaluate for acute process. If positive for acute stroke will continue with full stroke workup.  -Given high concern for stroke and the black box warning with prasugrel , I will change her to aspirin  325 mg daily - Check UDS  - HgbA1c, fasting lipid panel - MRI of the brain without contrast - Frequent neuro checks - Echocardiogram - Risk factor modification - Telemetry monitoring - PT consult, OT consult, Speech consult - Stroke team to  follow  ______________________________________________________________________  Signed, Karna DELENA Geralds, NP [facility]  I have seen the patient and reviewed the above note.  She is a 63 year old female with acute onset aphasia and mild right-sided weakness in the setting of multiple stroke risk factors including diabetes, hypertension, hyperlipidemia, advanced cancer, OSA.  I have high concern that she has had an acute ischemic stroke, but unfortunately she is outside the window for IV TNK and there is no LVO to target.  She will need to be admitted for secondary stroke risk factor modification as well as therapy evaluation.  Aisha Seals, MD [facility]   If 7pm- 7am, please page neurology on call as listed in AMION.

## 2024-04-20 NOTE — Progress Notes (Signed)
   04/20/24 2250  BiPAP/CPAP/SIPAP  $ Non-Invasive Home Ventilator  Initial  $ Face Mask Medium Yes  BiPAP/CPAP/SIPAP Pt Type Adult  BiPAP/CPAP/SIPAP Resmed  Mask Type Full face mask  Mask Size Medium  EPAP  (18-6)  Flow Rate 2 lpm  Patient Home Machine No  Patient Home Mask No  Patient Home Tubing No  Auto Titrate Yes  Minimum cmH2O 6 cmH2O  Maximum cmH2O 18 cmH2O  Device Plugged into RED Power Outlet Yes  BiPAP/CPAP /SiPAP Vitals  Pulse Rate (!) 108  Resp 18  Bilateral Breath Sounds Clear;Diminished

## 2024-04-20 NOTE — ED Notes (Signed)
 MRI waiting on contact with family. Pt unable to sit still in CT, will need meds for MRI. MRI will call when they are about to send for patient.

## 2024-04-20 NOTE — Progress Notes (Signed)
 Pt admitted to rm 18 from ED. Initiated tele. Oriented pt to unit. Obtained vs. Call bell within reach. Bed alarm on.   Amado GORMAN Arabia, RN

## 2024-04-20 NOTE — Progress Notes (Addendum)
 Pt unable to urinate. Bladder scanner showed 431 ml. In and out cath order received.   Amado GORMAN Arabia, RN

## 2024-04-20 NOTE — Code Documentation (Signed)
 Stroke Response Nurse Documentation Code Documentation  Brenda [name] is a 63 y.o. female arriving to [facility]  via [facility] on 8/18 with past medical hx of endometrial CA, HLD, DM, HTN, CAD, MI, CHF. On No antithrombotic. Code stroke was activated by EMS.   Patient from home where she was LKW at 0330 when she went to bed and now complaining of aphasia on waking at 1230.    Stroke team at the bedside on patient arrival. Labs drawn and patient cleared for CT by Dr. Nada. Patient to CT with team. NIHSS 6, see documentation for details and code stroke times. Patient with disoriented, right arm weakness, right decreased sensation, and Expressive aphasia  on exam. The following imaging was completed:  CT Head, CTA, and CTP. Patient is not a candidate for IV Thrombolytic due to LKW 0330. Patient is not a candidate for IR due to no LVO.   Care Plan: q2 NIHSS and vitals x 12 hours, then q4. MRI.   Bedside handoff with ED RN Mitzie.    Lauraine LITTIE Searle  Stroke Response RN

## 2024-04-20 NOTE — ED Provider Notes (Signed)
 [place] EMERGENCY DEPARTMENT AT [facility] Provider Note   CSN: [id number] Arrival date & time: 04/20/24  1342  An emergency department physician performed an initial assessment on this suspected stroke patient at 1346.  Patient presents with: Code Stroke   Brenda [name] is a 63 y.o. female past medical history significant for hypertension, hyperlipidemia, HFrEF with chronic respiratory failure on supplemental oxygen , stage IV uterine cancer, type 2 diabetes who presents emergency department for aphasia.  Patient's last known normal was approximately 3:30 AM.  EMS states that her husband noted the patient was speaking abnormally at approximately 1230 this afternoon.  Patient is unable to participate in HPI secondary to aphasia.   HPI     Prior to Admission medications   Medication Sig Start Date End Date Taking? Authorizing Provider  acetaminophen  (TYLENOL ) 500 MG tablet Take 1-2 tablets (500-1,000 mg total) by mouth every 8 (eight) hours as needed for mild pain (pain score 1-3) or moderate pain (pain score 4-6). 03/30/24 03/30/25  Hongalgi, Anand D, MD  atorvastatin  (LIPITOR ) 80 MG tablet Take 1 tablet (80 mg total) by mouth every evening. 10/03/21   Newlin, Enobong, MD  buPROPion  (WELLBUTRIN  XL) 150 MG 24 hr tablet Take 150 mg by mouth in the morning.    [provider]  Cholecalciferol  (VITAMIN D3 SUPER STRENGTH) 50 MCG (2000 UT) CAPS Take 2,000 Units by mouth in the morning.    [provider]  Continuous Glucose Sensor (FREESTYLE LIBRE 3 SENSOR) MISC Inject 1 Device into the skin every 14 (fourteen) days.    [provider]  FIASP  FLEXTOUCH 100 UNIT/ML FlexTouch Pen Inject 5-8 Units into the skin See admin instructions. Inject 5 units into the skin before breakfast & lunch and 8 units before supper/evening meal 12/08/22   [provider]  FLUoxetine  (PROZAC ) 40 MG capsule Take 40 mg by mouth daily. 01/17/23   [provider]   furosemide  (LASIX ) 40 MG tablet Take 1 tablet by mouth once daily 02/05/22   Newlin, Enobong, MD  insulin  degludec (TRESIBA) 100 UNIT/ML FlexTouch Pen Inject 20 Units into the skin every evening.    [provider]  lidocaine -prilocaine  (EMLA ) cream Apply to affected area once 04/16/24   Lonn Hicks, MD  losartan  (COZAAR ) 25 MG tablet Take 1 tablet (25 mg total) by mouth daily. 07/19/23   Cooper, Michael, MD  metFORMIN (GLUCOPHAGE-XR) 500 MG 24 hr tablet Take 1,000 mg by mouth in the morning and at bedtime. 07/05/22   [provider]  Misc. Devices MISC Portable oxygen  concentrator, 2L oxygen .  Diagnoses-chronic respiratory failure with hypoxia 11/27/21   Newlin, Enobong, MD  morphine  (MS CONTIN ) 15 MG 12 hr tablet Take 1 tablet (15 mg total) by mouth at bedtime. 04/16/24   Lonn Hicks, MD  nitroGLYCERIN  (NITROSTAT ) 0.4 MG SL tablet Dissolve 1 tablet under the tongue every 5 minutes as needed for chest pain. Max of 3 doses, then 911. 01/25/23   Lelon Hamilton T, PA-C  ondansetron  (ZOFRAN ) 8 MG tablet Take 1 tablet (8 mg total) by mouth every 8 (eight) hours as needed for nausea or vomiting. Start on the third day after carboplatin. 04/16/24   Lonn Hicks, MD  Oxycodone  HCl 10 MG TABS Take 1 tablet (10 mg total) by mouth every 4 (four) hours as needed for severe pain (pain score 7-10). 04/16/24   Lonn Hicks, MD  OXYGEN  Inhale 2 L/min into the lungs continuous.    [provider]  polyethylene glycol  powder (GLYCOLAX /MIRALAX ) 17 GM/SCOOP powder Take 17 g by mouth daily as needed for mild constipation. 09/17/23   [provider]  prasugrel  (EFFIENT ) 5 MG TABS tablet Take 1 tablet (5 mg total) by mouth in the morning. AFTER TAKING YOUR DOSE ON 04/07/2024, STOP TAKING IT IN PREPARATION FOR THE BIOPSY ON 04/15/2024. AFTER THE BIOPSY, FOLLOW THE RADIOLOGIST RECOMMENDATIONS AS TO TIMING OF RESTARTING THIS MEDICINE. 03/30/24   Judeth Trenda BIRCH, MD  pregabalin  (LYRICA ) 100 MG capsule Take  1 capsule (100 mg total) by mouth 3 (three) times daily. 02/05/24   Christine Rush, DPM  prochlorperazine  (COMPAZINE ) 10 MG tablet Take 1 tablet (10 mg total) by mouth every 6 (six) hours as needed for nausea or vomiting. 04/16/24   Lonn Hicks, MD  Respiratory Therapy Supplies (CARETOUCH 2 CPAP HOSE HANGER) MISC daily at night    [provider]    Allergies: Hydrocodone and Clopidogrel    Review of Systems  Updated Vital Signs BP 124/80   Pulse (!) 101   Temp (!) 97.5 F (36.4 C) (Oral)   Resp (!) 26   Wt 86.6 kg Comment: code stroke  LMP 10/30/2014 (Approximate)   SpO2 100%   BMI 34.92 kg/m   Physical Exam Vitals reviewed.  Eyes:     Pupils: Pupils are unequal.     Comments: R pupil 2mm L pupil 4mm  Cardiovascular:     Rate and Rhythm: Normal rate.     Pulses:          Radial pulses are 2+ on the right side and 2+ on the left side.     Heart sounds: Normal heart sounds. No murmur heard. Pulmonary:     Effort: Pulmonary effort is normal. No tachypnea.  Musculoskeletal:     Cervical back: Full passive range of motion without pain.     Comments: Spontaneous movement of bilateral upper and lower extremities   Neurological:     Mental Status: She is confused.     Cranial Nerves: No facial asymmetry.     Sensory: Sensation is intact.     Motor: No weakness, abnormal muscle tone, seizure activity or pronator drift.     Comments: Aphasic Unable to assess coordination 2/2 inability to follow difficult commands     (all labs ordered are listed, but only abnormal results are displayed) Labs Reviewed  PROTIME-INR - Abnormal; Notable for the following components:      Result Value   Prothrombin Time 16.1 (*)    All other components within normal limits  CBC - Abnormal; Notable for the following components:   WBC 20.8 (*)    RBC 3.82 (*)    Hemoglobin 10.3 (*)    HCT 32.2 (*)    All other components within normal limits  DIFFERENTIAL - Abnormal; Notable for the  following components:   Neutro Abs 18.9 (*)    Abs Immature Granulocytes 0.19 (*)    All other components within normal limits  COMPREHENSIVE METABOLIC PANEL WITH GFR - Abnormal; Notable for the following components:   Sodium 132 (*)    Chloride 95 (*)    CO2 15 (*)    Glucose, Bld 319 (*)    Calcium  8.5 (*)    Albumin 2.5 (*)    Alkaline Phosphatase 134 (*)    Total Bilirubin 2.0 (*)    Anion gap 22 (*)    All other components within normal limits  I-STAT CHEM 8, ED - Abnormal; Notable for the following  components:   Sodium 131 (*)    Chloride 96 (*)    Glucose, Bld 326 (*)    Calcium , Ion 0.98 (*)    TCO2 17 (*)    Hemoglobin 10.9 (*)    HCT 32.0 (*)    All other components within normal limits  CBG MONITORING, ED - Abnormal; Notable for the following components:   Glucose-Capillary 314 (*)    All other components within normal limits  APTT  ETHANOL  URINALYSIS, ROUTINE W REFLEX MICROSCOPIC  BETA-HYDROXYBUTYRIC ACID  I-STAT CG4 LACTIC ACID, ED    EKG: None  Radiology: CT ANGIO HEAD NECK W WO CM W PERF (CODE STROKE) Result Date: 04/20/2024 CLINICAL DATA:  Provided history: Neuro deficit, acute, stroke suspected. EXAM: CT ANGIOGRAPHY HEAD AND NECK CT PERFUSION BRAIN TECHNIQUE: Multidetector CT imaging of the head and neck was performed using the standard protocol during bolus administration of intravenous contrast. Multiplanar CT image reconstructions and MIPs were obtained to evaluate the vascular anatomy. Carotid stenosis measurements (when applicable) are obtained utilizing NASCET criteria, using the distal internal carotid diameter as the denominator. Multiphase CT imaging of the brain was performed following IV bolus contrast injection. Subsequent parametric perfusion maps were calculated using RAPID software. RADIATION DOSE REDUCTION: This exam was performed according to the departmental dose-optimization program which includes automated exposure control, adjustment of  the mA and/or kV according to patient size and/or use of iterative reconstruction technique. CONTRAST:  OMNIPAQUE  IOHEXOL  350 MG/ML SOLN COMPARISON:  Noncontrast head CT performed earlier today 04/20/2024. CT angiogram head 05/31/2016. Chest CT 04/03/2024. FINDINGS: CTA NECK FINDINGS Aortic arch: Standard aortic branching. Atherosclerotic plaque within the aortic arch and proximal major branch vessels of the neck. Streak/beam hardening artifact arising from a dense contrast bolus partially obscures the right subclavian artery. Within this limitation, there is no appreciable hemodynamically significant innominate or proximal subclavian artery stenosis. Right carotid system: CCA and ICA patent within the neck without stenosis. Mild calcified atherosclerotic plaque about the carotid bifurcation. Left carotid system: CCA and ICA patent within the neck without stenosis. Mild calcified plaque within the proximal ICA. Vertebral arteries: Patent within the neck without stenosis or significant atherosclerotic disease. The right vertebral artery is dominant. Skeleton: The neck bilaterally. No acute fracture or aggressive osseous lesion. Other neck: Subcentimeter right thyroid  lobe nodule not meeting consensus criteria for ultrasound follow-up based on size. No follow-up imaging recommended. Reference: J Am Coll Radiol. 2015 Feb;12(2): 143-50. Mild nonspecific edema within Upper chest: Only faint residual ground-glass opacity remains at site of the 12 mm right upper lobe pulmonary nodule described on the recent prior chest CT of 04/03/2024 (series 5, image 341). A 6 mm part solid nodule more medially within the right upper lobe is unchanged (series 5, image 335). Right chest infusion port catheter. Review of the MIP images confirms the above findings CTA HEAD FINDINGS Anterior circulation: The intracranial internal carotid arteries are patent. Non-stenotic atherosclerotic plaque within both vessels. The M1 middle  cerebral arteries are patent. No M2 proximal branch occlusion or high-grade proximal stenosis. The anterior cerebral arteries are patent. No intracranial aneurysm is identified. Posterior circulation: The intracranial vertebral arteries are patent. The basilar artery is patent. The posterior cerebral arteries are patent. Posterior communicating arteries are diminutive or absent, bilaterally. Venous sinuses: Within the limitations of contrast timing, no convincing thrombus. Anatomic variants: As described. Review of the MIP images confirms the above findings CT Brain Perfusion Findings: Poor source data/bolus quality limits reliability of the perfusion  estimates. Within this limitation, findings are as follows. CBF (<30%) Volume: 0mL Perfusion (Tmax>6.0s) volume: 8 mL (right temporal lobe). Mismatch Volume: 8mL Infarction Location:None identified. No emergent large vessel occlusion identified. These results were communicated to Dr. Michaela at 2:26 pmon 8/18/2025by text page via the [facility] messaging system. IMPRESSION: CTA neck: 1. The common carotid and internal carotid arteries are patent within the neck without stenosis. Mild atherosclerotic plaque bilaterally, as described. 2. The vertebral arteries are patent within the neck without stenosis or significant atherosclerotic disease. 3. Aortic Atherosclerosis (ICD10-I70.0). 4. Only faint residual ground-glass opacity remains at site of the 12 mm right upper lobe pulmonary nodule described on the recent prior chest CT of 04/03/2024. 5. A 6 mm part-solid nodule more medially within the right upper lobe is unchanged. CTA head: 1. No proximal intracranial large vessel occlusion or high-grade proximal arterial stenosis is identified. 2. Non-stenotic atherosclerotic plaque within the intracranial ICAs. CT perfusion head: Poor source data/bolus quality limits reliability of the perfusion estimates. Within this limitation, findings are as follows. The perfusion software  identifies no core infarct. The perfusion software identifies an 8 mL region of hypoperfusion in the right temporal lobe (utilizing the Tmax>6 seconds threshold). Reported mismatch volume: 8 mL. Electronically Signed   By: Rockey Childs D.O.   On: 04/20/2024 14:27   CT HEAD CODE STROKE WO CONTRAST Result Date: 04/20/2024 CLINICAL DATA:  Provided history: Code stroke. Neuro deficit, acute, stroke suspected. EXAM: CT HEAD WITHOUT CONTRAST TECHNIQUE: Contiguous axial images were obtained from the base of the skull through the vertex without intravenous contrast. RADIATION DOSE REDUCTION: This exam was performed according to the departmental dose-optimization program which includes automated exposure control, adjustment of the mA and/or kV according to patient size and/or use of iterative reconstruction technique. COMPARISON:  CT angiogram head 05/31/2016. FINDINGS: Brain: Mild generalized cerebral atrophy. There is no acute intracranial hemorrhage. No demarcated cortical infarct. No extra-axial fluid collection. No evidence of an intracranial mass. No midline shift. Vascular: No hyperdense vessel.  Atherosclerotic calcifications. Skull: No calvarial fracture or aggressive osseous lesion. Sinuses/Orbits: No mass or acute finding within the imaged orbits. Opacification of a single right ethmoid air cell. ASPECTS [facility] Stroke Program Early CT Score) - Ganglionic level infarction (caudate, lentiform nuclei, internal capsule, insula, M1-M3 cortex): 7 - Supraganglionic infarction (M4-M6 cortex): 3 Total score (0-10 with 10 being normal): 10 No evidence of an acute intracranial abnormality. These results were communicated to Dr. Michaela at 1:58 pmon 8/18/2025by text page via the [facility] messaging system. IMPRESSION: 1.  No evidence of an acute intracranial abnormality. 2. Mild generalized cerebral atrophy. 3. Mild right ethmoid sinusitis. Electronically Signed   By: Rockey Childs D.O.   On: 04/20/2024 14:00      Procedures   Medications Ordered in the ED  atorvastatin  (LIPITOR ) tablet 80 mg (has no administration in time range)  buPROPion  (WELLBUTRIN  XL) 24 hr tablet 150 mg (has no administration in time range)  FLUoxetine  (PROZAC ) capsule 40 mg (has no administration in time range)  prasugrel  (EFFIENT ) tablet 5 mg (has no administration in time range)  pregabalin  (LYRICA ) capsule 100 mg (has no administration in time range)  sodium chloride  flush (NS) 0.9 % injection 3 mL (3 mLs Intravenous Given 04/20/24 1514)  acetaminophen  (TYLENOL ) tablet 650 mg (has no administration in time range)    Or  acetaminophen  (TYLENOL ) suppository 650 mg (has no administration in time range)  polyethylene glycol (MIRALAX  / GLYCOLAX ) packet 17 g (has no administration in  time range)  sodium chloride  flush (NS) 0.9 % injection 3 mL (3 mLs Intravenous Given 04/20/24 1409)  iohexol  (OMNIPAQUE ) 350 MG/ML injection 100 mL (100 mLs Intravenous Contrast Given 04/20/24 1356)  droperidol  (INAPSINE ) 2.5 MG/ML injection 0.625 mg (0.625 mg Intravenous Given 04/20/24 1458)    Clinical Course as of 04/20/24 1515  Mon Apr 20, 2024  1445 Consult hospitalist placed [AG]    Clinical Course User Index [AG] Nada Chroman, DO                                 Medical Decision Making Amount and/or Complexity of Data Reviewed Labs: ordered. Radiology: ordered.  Risk Prescription drug management. Decision regarding hospitalization.   On initial evaluation neurology at patient's bedside.  Airway is patent.  Patient does have significant aphasia during evaluation concerning for acute stroke.  Patient is outside of the window of TNK as last known normal >4.5 hours. Patient taken to CT- CTA head and neck without evidence of LVO therefore patient not taken to thrombectomy at this time.  Neurology with recommendations to obtain MRI imaging of the brain with and without contrast secondary to uterine cancer. Differential diagnosis  includes CVA/TIA, infection/sepsis, hypoglycemia, trauma, metastasis.  Laboratory studies obtained and significant for hyperglycemia.  CBC with evidence of leukocytosis however no significant anemia.  CMP with evidence of hyperglycemia.  No AKI noted on laboratory studies.  During evaluation patient agitated and trying to get out of bed.  Will give droperidol  for patient safety and concern for inability to obtain MRI imaging secondary to agitation.  Patient will be admitted to hospitalist service for continued care and management with need for possible sedated MRI and further workup for hyperglycemia, leukocytosis and altered mental status..     Final diagnoses:  Aphasia    ED Discharge Orders     None       Chroman Nada DO Emergency Medicine PGY2    Nada Chroman, DO 04/20/24 1515    Tonia Chew, MD 04/21/24 1721

## 2024-04-20 NOTE — ED Triage Notes (Signed)
 Pt BIB EMS after family found her aphasic when she woke up around 1330. LKW 0300. Pt unable to communicate needs and only able to say simple phrases like yes and I don't know.

## 2024-04-20 NOTE — Progress Notes (Signed)
 VAST consult for PIV placement. Pt has right chest PORT placed 1 week ago per support person at bedside. Upon assessment surgical site-PORT site red and skin peeling, upper incision area also red. Will not access PORT at this time due to site condition. Notified RN and request MD to assess. RN VU. Powell Bowler RN VAST

## 2024-04-20 NOTE — ED Notes (Signed)
 Message sent to IP attending regarding difficulty going to MRI despite medication administration and attempts at redirection. MD aware/no new orders at this time. Family at bedside with patient.

## 2024-04-20 NOTE — Progress Notes (Addendum)
 Notified unable to undergo MRI after 2nd attempt due to encephalopathy, confusion climbing out of MRI / unable to stay still. Considering this is 2nd attempt and encephalopathic doubt additional anxiolytic will allow for MRI completion, have changed order to MRI with anesthesia. Also when reviewing labs, noted high beta hydroxybutyrate, possible DKA. Have ordered for repeat BMP, VBG, lactate. 20 ml/kg IVF to start. And will plan on insulin  gtt after reassessment of her potassium to ensure safe. Will change to progressive LOC   Dorn Dawson, MD  [facility]

## 2024-04-20 NOTE — ED Notes (Signed)
 Message sent to MD for request for medication for patient prior to MRI. Awaiting response/new orders at this time.

## 2024-04-21 ENCOUNTER — Encounter (HOSPITAL_COMMUNITY)
Admission: EM | Disposition: A | Discharge status: Home / Self Care | Payer: Self-pay | Source: Home / Self Care | Attending: Student

## 2024-04-21 ENCOUNTER — Other Ambulatory Visit: Payer: Self-pay | Admitting: Hematology and Oncology

## 2024-04-21 ENCOUNTER — Inpatient Hospital Stay (HOSPITAL_COMMUNITY)

## 2024-04-21 ENCOUNTER — Observation Stay (HOSPITAL_COMMUNITY)

## 2024-04-21 ENCOUNTER — Observation Stay (HOSPITAL_COMMUNITY): Admitting: Anesthesiology

## 2024-04-21 ENCOUNTER — Encounter (HOSPITAL_COMMUNITY): Payer: Self-pay | Admitting: Internal Medicine

## 2024-04-21 DIAGNOSIS — G9341 Metabolic encephalopathy: Secondary | ICD-10-CM

## 2024-04-21 DIAGNOSIS — R4701 Aphasia: Secondary | ICD-10-CM | POA: Diagnosis present

## 2024-04-21 DIAGNOSIS — E7800 Pure hypercholesterolemia, unspecified: Secondary | ICD-10-CM | POA: Diagnosis present

## 2024-04-21 DIAGNOSIS — I110 Hypertensive heart disease with heart failure: Secondary | ICD-10-CM | POA: Diagnosis present

## 2024-04-21 DIAGNOSIS — F32A Depression, unspecified: Secondary | ICD-10-CM | POA: Diagnosis present

## 2024-04-21 DIAGNOSIS — I10 Essential (primary) hypertension: Secondary | ICD-10-CM

## 2024-04-21 DIAGNOSIS — E1140 Type 2 diabetes mellitus with diabetic neuropathy, unspecified: Secondary | ICD-10-CM | POA: Diagnosis present

## 2024-04-21 DIAGNOSIS — Z6834 Body mass index (BMI) 34.0-34.9, adult: Secondary | ICD-10-CM | POA: Diagnosis not present

## 2024-04-21 DIAGNOSIS — E1110 Type 2 diabetes mellitus with ketoacidosis without coma: Secondary | ICD-10-CM | POA: Diagnosis present

## 2024-04-21 DIAGNOSIS — G459 Transient cerebral ischemic attack, unspecified: Secondary | ICD-10-CM

## 2024-04-21 DIAGNOSIS — I5032 Chronic diastolic (congestive) heart failure: Secondary | ICD-10-CM | POA: Diagnosis present

## 2024-04-21 DIAGNOSIS — I2510 Atherosclerotic heart disease of native coronary artery without angina pectoris: Secondary | ICD-10-CM

## 2024-04-21 DIAGNOSIS — J9611 Chronic respiratory failure with hypoxia: Secondary | ICD-10-CM | POA: Diagnosis present

## 2024-04-21 DIAGNOSIS — E875 Hyperkalemia: Secondary | ICD-10-CM | POA: Diagnosis present

## 2024-04-21 DIAGNOSIS — E876 Hypokalemia: Secondary | ICD-10-CM | POA: Diagnosis present

## 2024-04-21 DIAGNOSIS — D630 Anemia in neoplastic disease: Secondary | ICD-10-CM | POA: Diagnosis present

## 2024-04-21 DIAGNOSIS — E118 Type 2 diabetes mellitus with unspecified complications: Secondary | ICD-10-CM | POA: Diagnosis not present

## 2024-04-21 DIAGNOSIS — G893 Neoplasm related pain (acute) (chronic): Secondary | ICD-10-CM | POA: Diagnosis present

## 2024-04-21 DIAGNOSIS — I5030 Unspecified diastolic (congestive) heart failure: Secondary | ICD-10-CM | POA: Diagnosis not present

## 2024-04-21 DIAGNOSIS — Z9981 Dependence on supplemental oxygen: Secondary | ICD-10-CM | POA: Diagnosis not present

## 2024-04-21 DIAGNOSIS — R29818 Other symptoms and signs involving the nervous system: Secondary | ICD-10-CM | POA: Diagnosis not present

## 2024-04-21 DIAGNOSIS — R2990 Unspecified symptoms and signs involving the nervous system: Secondary | ICD-10-CM | POA: Diagnosis not present

## 2024-04-21 DIAGNOSIS — E1169 Type 2 diabetes mellitus with other specified complication: Secondary | ICD-10-CM | POA: Diagnosis present

## 2024-04-21 DIAGNOSIS — E66811 Obesity, class 1: Secondary | ICD-10-CM | POA: Diagnosis present

## 2024-04-21 DIAGNOSIS — C3411 Malignant neoplasm of upper lobe, right bronchus or lung: Secondary | ICD-10-CM | POA: Diagnosis present

## 2024-04-21 DIAGNOSIS — E871 Hypo-osmolality and hyponatremia: Secondary | ICD-10-CM | POA: Diagnosis present

## 2024-04-21 DIAGNOSIS — F419 Anxiety disorder, unspecified: Secondary | ICD-10-CM | POA: Diagnosis not present

## 2024-04-21 DIAGNOSIS — C541 Malignant neoplasm of endometrium: Secondary | ICD-10-CM | POA: Diagnosis present

## 2024-04-21 DIAGNOSIS — Z794 Long term (current) use of insulin: Secondary | ICD-10-CM | POA: Diagnosis not present

## 2024-04-21 DIAGNOSIS — N390 Urinary tract infection, site not specified: Secondary | ICD-10-CM | POA: Diagnosis present

## 2024-04-21 DIAGNOSIS — C772 Secondary and unspecified malignant neoplasm of intra-abdominal lymph nodes: Secondary | ICD-10-CM | POA: Diagnosis present

## 2024-04-21 HISTORY — PX: RADIOLOGY WITH ANESTHESIA: SHX6223

## 2024-04-21 LAB — GLUCOSE, CAPILLARY
Glucose-Capillary: 275 mg/dL — ABNORMAL HIGH (ref 70–99)
Glucose-Capillary: 186 mg/dL — ABNORMAL HIGH (ref 70–99)
Glucose-Capillary: 96 mg/dL (ref 70–99)
Glucose-Capillary: 93 mg/dL (ref 70–99)
Glucose-Capillary: 131 mg/dL — ABNORMAL HIGH (ref 70–99)
Glucose-Capillary: 139 mg/dL — ABNORMAL HIGH (ref 70–99)
Glucose-Capillary: 124 mg/dL — ABNORMAL HIGH (ref 70–99)
Glucose-Capillary: 161 mg/dL — ABNORMAL HIGH (ref 70–99)
Glucose-Capillary: 166 mg/dL — ABNORMAL HIGH (ref 70–99)
Glucose-Capillary: 157 mg/dL — ABNORMAL HIGH (ref 70–99)
Glucose-Capillary: 151 mg/dL — ABNORMAL HIGH (ref 70–99)
Glucose-Capillary: 235 mg/dL — ABNORMAL HIGH (ref 70–99)

## 2024-04-21 LAB — RAPID URINE DRUG SCREEN, HOSP PERFORMED
Amphetamines: NOT DETECTED
Barbiturates: NOT DETECTED
Benzodiazepines: NOT DETECTED
Cocaine: NOT DETECTED
Opiates: POSITIVE — AB
Tetrahydrocannabinol: NOT DETECTED

## 2024-04-21 LAB — TSH: TSH: 1.692 u[IU]/mL (ref 0.350–4.500)

## 2024-04-21 LAB — CBC
HCT: 27.9 % — ABNORMAL LOW (ref 36.0–46.0)
Hemoglobin: 9.2 g/dL — ABNORMAL LOW (ref 12.0–15.0)
MCH: 27 pg (ref 26.0–34.0)
MCHC: 33 g/dL (ref 30.0–36.0)
MCV: 81.8 fL (ref 80.0–100.0)
Platelets: 346 K/uL (ref 150–400)
RBC: 3.41 MIL/uL — ABNORMAL LOW (ref 3.87–5.11)
RDW: 13.8 % (ref 11.5–15.5)
WBC: 19.2 K/uL — ABNORMAL HIGH (ref 4.0–10.5)
nRBC: 0 % (ref 0.0–0.2)

## 2024-04-21 LAB — LACTIC ACID, PLASMA
Lactic Acid, Venous: 1 mmol/L (ref 0.5–1.9)
Lactic Acid, Venous: 2.2 mmol/L (ref 0.5–1.9)

## 2024-04-21 LAB — AMMONIA: Ammonia: 13 umol/L (ref 9–35)

## 2024-04-21 LAB — ECHOCARDIOGRAM COMPLETE
AR max vel: 1.41 cm2
AV Area VTI: 1.52 cm2
AV Area mean vel: 1.43 cm2
AV Mean grad: 13.7 mmHg
AV Peak grad: 26.6 mmHg
Ao pk vel: 2.58 m/s
Area-P 1/2: 4.21 cm2
Height: 62 in
S' Lateral: 2.95 cm
Weight: 3054.69 [oz_av]

## 2024-04-21 LAB — COMPREHENSIVE METABOLIC PANEL WITH GFR
ALT: 11 U/L (ref 0–44)
AST: 14 U/L — ABNORMAL LOW (ref 15–41)
Albumin: 2.2 g/dL — ABNORMAL LOW (ref 3.5–5.0)
Alkaline Phosphatase: 121 U/L (ref 38–126)
Anion gap: 15 (ref 5–15)
BUN: 13 mg/dL (ref 6–20)
CO2: 23 mmol/L (ref 22–32)
Calcium: 8.6 mg/dL — ABNORMAL LOW (ref 8.9–10.3)
Chloride: 95 mmol/L — ABNORMAL LOW (ref 98–111)
Creatinine, Ser: 0.84 mg/dL (ref 0.44–1.00)
GFR, Estimated: >60 mL/min (ref >60)
Glucose, Bld: 267 mg/dL — ABNORMAL HIGH (ref 70–99)
Potassium: 4.4 mmol/L (ref 3.5–5.1)
Sodium: 133 mmol/L — ABNORMAL LOW (ref 135–145)
Total Bilirubin: 1.2 mg/dL (ref 0.0–1.2)
Total Protein: 5 g/dL — ABNORMAL LOW (ref 6.5–8.1)

## 2024-04-21 LAB — BASIC METABOLIC PANEL WITH GFR
Anion gap: 11 (ref 5–15)
BUN: 11 mg/dL (ref 6–20)
CO2: 22 mmol/L (ref 22–32)
Calcium: 8.3 mg/dL — ABNORMAL LOW (ref 8.9–10.3)
Chloride: 100 mmol/L (ref 98–111)
Creatinine, Ser: 0.76 mg/dL (ref 0.44–1.00)
GFR, Estimated: >60 mL/min (ref >60)
Glucose, Bld: 115 mg/dL — ABNORMAL HIGH (ref 70–99)
Potassium: 3.9 mmol/L (ref 3.5–5.1)
Sodium: 133 mmol/L — ABNORMAL LOW (ref 135–145)

## 2024-04-21 LAB — LIPID PANEL
Cholesterol: 67 mg/dL (ref 0–200)
HDL: 27 mg/dL — ABNORMAL LOW (ref >40)
LDL Cholesterol: 25 mg/dL (ref 0–99)
Total CHOL/HDL Ratio: 2.5 ratio
Triglycerides: 73 mg/dL (ref <150)
VLDL: 15 mg/dL (ref 0–40)

## 2024-04-21 LAB — PHOSPHORUS: Phosphorus: 2.7 mg/dL (ref 2.5–4.6)

## 2024-04-21 LAB — BETA-HYDROXYBUTYRIC ACID
Beta-Hydroxybutyric Acid: 2.27 mmol/L — ABNORMAL HIGH (ref 0.05–0.27)
Beta-Hydroxybutyric Acid: 1.58 mmol/L — ABNORMAL HIGH (ref 0.05–0.27)
Beta-Hydroxybutyric Acid: 0.99 mmol/L — ABNORMAL HIGH (ref 0.05–0.27)
Beta-Hydroxybutyric Acid: 2.75 mmol/L — ABNORMAL HIGH (ref 0.05–0.27)

## 2024-04-21 LAB — HEMOGLOBIN A1C
Hgb A1c MFr Bld: 9.3 % — ABNORMAL HIGH (ref 4.8–5.6)
Mean Plasma Glucose: 220.21 mg/dL

## 2024-04-21 LAB — POTASSIUM: Potassium: 3.2 mmol/L — ABNORMAL LOW (ref 3.5–5.1)

## 2024-04-21 LAB — VITAMIN B12: Vitamin B-12: 764 pg/mL (ref 180–914)

## 2024-04-21 LAB — MAGNESIUM: Magnesium: 1.1 mg/dL — ABNORMAL LOW (ref 1.7–2.4)

## 2024-04-21 SURGERY — MRI WITH ANESTHESIA
Anesthesia: General

## 2024-04-21 MED ORDER — PROPOFOL 10 MG/ML IV BOLUS
INTRAVENOUS | Status: DC | PRN
  Administered 2024-04-21: 150 mg via INTRAVENOUS

## 2024-04-21 MED ORDER — MIDAZOLAM HCL 2 MG/2ML IJ SOLN
INTRAMUSCULAR | Status: DC | PRN
  Administered 2024-04-21: 2 mg via INTRAVENOUS

## 2024-04-21 MED ORDER — INSULIN ASPART 100 UNIT/ML IJ SOLN
0.0000 [IU] | INTRAMUSCULAR | Status: DC
  Administered 2024-04-21: 3 [IU] via SUBCUTANEOUS
  Administered 2024-04-22: 5 [IU] via SUBCUTANEOUS
  Administered 2024-04-22: 3 [IU] via SUBCUTANEOUS

## 2024-04-21 MED ORDER — LACTATED RINGERS IV SOLN: INTRAVENOUS | Status: DC

## 2024-04-21 MED ORDER — OXYCODONE HCL 5 MG PO TABS
5.0000 mg | ORAL_TABLET | Freq: Four times a day (QID) | ORAL | Status: DC | PRN
  Administered 2024-04-21 – 2024-04-23 (×5): 5 mg via ORAL
  Filled 2024-04-21 (×5): qty 1

## 2024-04-21 MED ORDER — DEXTROSE IN LACTATED RINGERS 5 % IV SOLN: INTRAVENOUS | Status: DC

## 2024-04-21 MED ORDER — SUGAMMADEX SODIUM 200 MG/2ML IV SOLN
INTRAVENOUS | Status: DC | PRN
  Administered 2024-04-21: 400 mg via INTRAVENOUS

## 2024-04-21 MED ORDER — DEXAMETHASONE SODIUM PHOSPHATE 10 MG/ML IJ SOLN
INTRAMUSCULAR | Status: DC | PRN
  Administered 2024-04-21: 5 mg via INTRAVENOUS

## 2024-04-21 MED ORDER — ROCURONIUM BROMIDE 10 MG/ML (PF) SYRINGE
PREFILLED_SYRINGE | INTRAVENOUS | Status: DC | PRN
  Administered 2024-04-21: 60 mg via INTRAVENOUS

## 2024-04-21 MED ORDER — INSULIN GLARGINE-YFGN 100 UNIT/ML ~~LOC~~ SOLN: 12.0000 [IU] | Freq: Every day | SUBCUTANEOUS | Status: DC

## 2024-04-21 MED ORDER — PHENYLEPHRINE HCL-NACL 20-0.9 MG/250ML-% IV SOLN
INTRAVENOUS | Status: DC | PRN
  Administered 2024-04-21: 40 ug/min via INTRAVENOUS

## 2024-04-21 MED ORDER — LACTATED RINGERS IV BOLUS
500.0000 mL | Freq: Once | INTRAVENOUS | Status: AC
  Administered 2024-04-21: 500 mL via INTRAVENOUS

## 2024-04-21 MED ORDER — ONDANSETRON HCL 4 MG/2ML IJ SOLN
INTRAMUSCULAR | Status: DC | PRN
  Administered 2024-04-21: 4 mg via INTRAVENOUS

## 2024-04-21 MED ORDER — INSULIN GLARGINE 100 UNIT/ML ~~LOC~~ SOLN
12.0000 [IU] | Freq: Every day | SUBCUTANEOUS | Status: DC
  Administered 2024-04-21: 12 [IU] via SUBCUTANEOUS
  Filled 2024-04-21 (×2): qty 0.12

## 2024-04-21 MED ORDER — LACTATED RINGERS IV SOLN: INTRAVENOUS | Status: DC | PRN

## 2024-04-21 MED ORDER — INSULIN REGULAR(HUMAN) IN NACL 100-0.9 UT/100ML-% IV SOLN
INTRAVENOUS | Status: DC
  Administered 2024-04-21: 15 [IU]/h via INTRAVENOUS
  Filled 2024-04-21: qty 100

## 2024-04-21 MED ORDER — PREGABALIN 25 MG PO CAPS
50.0000 mg | ORAL_CAPSULE | Freq: Three times a day (TID) | ORAL | Status: DC
  Administered 2024-04-21 – 2024-04-24 (×9): 50 mg via ORAL
  Filled 2024-04-21 (×9): qty 2

## 2024-04-21 MED ORDER — GADOBUTROL 1 MMOL/ML IV SOLN
9.0000 mL | Freq: Once | INTRAVENOUS | Status: AC | PRN
  Administered 2024-04-21: 9 mL via INTRAVENOUS

## 2024-04-21 MED ORDER — CHLORHEXIDINE GLUCONATE 0.12 % MT SOLN
OROMUCOSAL | Status: AC
  Administered 2024-04-21: 15 mL
  Filled 2024-04-21: qty 15

## 2024-04-21 MED ORDER — MAGNESIUM SULFATE 4 GM/100ML IV SOLN
4.0000 g | Freq: Once | INTRAVENOUS | Status: AC
  Administered 2024-04-21: 4 g via INTRAVENOUS
  Filled 2024-04-21: qty 100

## 2024-04-21 MED ORDER — CHLORHEXIDINE GLUCONATE CLOTH 2 % EX PADS
6.0000 | MEDICATED_PAD | Freq: Every day | CUTANEOUS | Status: DC
  Administered 2024-04-21 – 2024-04-24 (×4): 6 via TOPICAL

## 2024-04-21 MED ORDER — INSULIN ASPART 100 UNIT/ML IJ SOLN
0.0000 [IU] | INTRAMUSCULAR | Status: DC
  Administered 2024-04-21: 2 [IU] via SUBCUTANEOUS

## 2024-04-21 MED ORDER — MIDAZOLAM HCL 2 MG/2ML IJ SOLN
INTRAMUSCULAR | Status: AC
  Filled 2024-04-21: qty 2

## 2024-04-21 MED ORDER — DEXTROSE 50 % IV SOLN: 0.0000 mL | INTRAVENOUS | Status: DC | PRN

## 2024-04-21 NOTE — Progress Notes (Signed)
 Patient off unit to short stay. Report given to RN and they will manage endo tool.

## 2024-04-21 NOTE — Anesthesia Postprocedure Evaluation (Signed)
 Anesthesia Post Note  Patient: Brenda [name]  Procedure(s) Performed: MRI WITH ANESTHESIA     Patient location during evaluation: PACU Anesthesia Type: General Level of consciousness: awake Pain management: pain level controlled Vital Signs Assessment: post-procedure vital signs reviewed and stable Respiratory status: spontaneous breathing, nonlabored ventilation and respiratory function stable Cardiovascular status: blood pressure returned to baseline and stable Postop Assessment: no apparent nausea or vomiting Anesthetic complications: no   No notable events documented.  Last Vitals:  Vitals:   04/21/24 1324 04/21/24 1342  BP: (!) 109/46 124/61  Pulse: 95 94  Resp: 20 18  Temp: 36.8 C 36.7 C  SpO2: 98% 100%    Last Pain:  Vitals:   04/21/24 1342  TempSrc: Oral  PainSc:                  Brenda [name]

## 2024-04-21 NOTE — Evaluation (Signed)
 Physical Therapy Evaluation Patient Details Name: Brenda [name] MRN: [id number] DOB: [birth year] Today's Date: 04/21/2024  History of Present Illness  63 y.o. female presents to [facility] hospital on 04/20/2024 with aphasia. Pt noted to be hyperglycemic and with concern for DKA, MRI brain negative for acute processes. PMH includes HTN, lipidemia, DM, CAD, CHF, anxiety, depression, OSA, chronic respiratory failure, metastatic endometrial carcinoma.  Clinical Impression  Pt presents to PT with deficits in communication, cognition, strength, power, endurance, gait. Pt is able to utilize the support of a RW to stand and ambulate for short household distances. PT notes significant expressive aphasia during session, as the pt is unable to report her date of birth and loses attention to questions or the task at hand. Pt will benefit from frequent mobilization in an effort to improve mobility quality and to reduce falls risk as the pt has a history of multiple falls. Patient will benefit from continued inpatient follow up therapy, <3 hours/day, however if the patient's cognition improves or if consistent caregiver support is available discharge home with HHPT may become a more viable option.        If plan is discharge home, recommend the following: A little help with walking and/or transfers;A little help with bathing/dressing/bathroom;Assistance with cooking/housework;Assist for transportation;Help with stairs or ramp for entrance   Can travel by private vehicle   Yes    Equipment Recommendations None recommended by PT  Recommendations for Other Services       Functional Status Assessment Patient has had a recent decline in their functional status and demonstrates the ability to make significant improvements in function in a reasonable and predictable amount of time.     Precautions / Restrictions Precautions Precautions: Fall Recall of Precautions/Restrictions: Impaired Restrictions Weight  Bearing Restrictions Per Provider Order: No      Mobility  Bed Mobility Overal bed mobility: Modified Independent                  Transfers Overall transfer level: Needs assistance Equipment used: Rolling walker (2 wheels) Transfers: Sit to/from Stand Sit to Stand: Contact guard assist                Ambulation/Gait Ambulation/Gait assistance: Contact guard assist Gait Distance (Feet): 80 Feet Assistive device: Rolling walker (2 wheels) Gait Pattern/deviations: Step-through pattern Gait velocity: reduced Gait velocity interpretation: <1.8 ft/sec, indicate of risk for recurrent falls   General Gait Details: slowed step-through gait  Stairs            Wheelchair Mobility     Tilt Bed    Modified Rankin (Stroke Patients Only)       Balance Overall balance assessment: Needs assistance Sitting-balance support: No upper extremity supported, Feet supported Sitting balance-Leahy Scale: Good     Standing balance support: Bilateral upper extremity supported, Reliant on assistive device for balance Standing balance-Leahy Scale: Poor                               Pertinent Vitals/Pain Pain Assessment Pain Assessment: No/denies pain    Home Living Family/patient expects to be discharged to:: Private residence Living Arrangements: Other relatives (nephew who is in high school) Available Help at Discharge: Family;Available PRN/intermittently Type of Home: House Home Access: Ramped entrance       Home Layout: One level Home Equipment: Rollator (4 wheels);Shower seat;Grab bars - tub/shower;Grab bars - toilet;Hand held shower head;Adaptive equipment  Prior Function Prior Level of Function : History of Falls (last six months);Independent/Modified Independent;Driving             Mobility Comments: pt reports losing her balance with recent fall prior to admission. pt's spouse feels her LLE may be buckling if retroperitoneal mass  is compressing nerves in back ADLs Comments: Patient has not needed any assist for ADL/IADL.  Shares chores with nephew. On disability.     Extremity/Trunk Assessment   Upper Extremity Assessment Upper Extremity Assessment: Overall WFL for tasks assessed    Lower Extremity Assessment Lower Extremity Assessment: Generalized weakness    Cervical / Trunk Assessment Cervical / Trunk Assessment: Normal  Communication   Communication Communication: Impaired Factors Affecting Communication: Difficulty expressing self    Cognition Arousal: Alert Behavior During Therapy: WFL for tasks assessed/performed   PT - Cognitive impairments: Orientation, Awareness, Memory, Attention, Difficult to assess, Problem solving, Safety/Judgement Difficult to assess due to: Impaired communication Orientation impairments: Place, Time, Situation (unable to report birth date)                     Following commands: Intact       Cueing Cueing Techniques: Verbal cues     General Comments General comments (skin integrity, edema, etc.): VSS on 2L [place]    Exercises     Assessment/Plan    PT Assessment Patient needs continued PT services  PT Problem List Decreased strength;Decreased activity tolerance;Decreased balance;Decreased mobility;Decreased knowledge of use of DME;Decreased knowledge of precautions       PT Treatment Interventions DME instruction;Gait training;Functional mobility training;Therapeutic activities;Therapeutic exercise;Balance training;Neuromuscular re-education;Cognitive remediation;Patient/family education    PT Goals (Current goals can be found in the Care Plan section)  Acute Rehab PT Goals Patient Stated Goal: to return to independence PT Goal Formulation: With patient/family Time For Goal Achievement: 05/05/24 Potential to Achieve Goals: Good    Frequency Min 3X/week     Co-evaluation               AM-PAC PT 6 Clicks Mobility  Outcome Measure Help  needed turning from your back to your side while in a flat bed without using bedrails?: A Little Help needed moving from lying on your back to sitting on the side of a flat bed without using bedrails?: A Little Help needed moving to and from a bed to a chair (including a wheelchair)?: A Little Help needed standing up from a chair using your arms (e.g., wheelchair or bedside chair)?: A Little Help needed to walk in hospital room?: A Little Help needed climbing 3-5 steps with a railing? : A Lot 6 Click Score: 17    End of Session Equipment Utilized During Treatment: Gait belt;Oxygen  Activity Tolerance: Patient tolerated treatment well Patient left: in bed;with call bell/phone within reach;with bed alarm set;with family/visitor present Nurse Communication: Mobility status PT Visit Diagnosis: Other abnormalities of gait and mobility (R26.89);Muscle weakness (generalized) (M62.81);Other symptoms and signs involving the nervous system (R29.898)    Time: 1731-1759 PT Time Calculation (min) (ACUTE ONLY): 28 min   Charges:   PT Evaluation $PT Eval Low Complexity: 1 Low   PT General Charges $$ ACUTE PT VISIT: 1 Visit         Bernardino JINNY Ruth, PT, DPT Acute Rehabilitation Office [phone]   Bernardino JINNY Ruth 04/21/2024, 6:08 PM

## 2024-04-21 NOTE — Anesthesia Preprocedure Evaluation (Addendum)
 Anesthesia Evaluation  Patient identified by MRN, date of birth, ID band Patient awake    Reviewed: Allergy & Precautions, NPO status , Patient's Chart, lab work & pertinent test results  History of Anesthesia Complications Negative for: history of anesthetic complications  Airway Mallampati: II  TM Distance: >3 FB Neck ROM: Full   Comment: Previous grade I view with MAC 3 and Glidescope 4, previous documented difficult mask and easy mask Dental  (+) Dental Advisory Given, Poor Dentition   Pulmonary shortness of breath and Long-Term Oxygen  Therapy, sleep apnea , neg COPD, former smoker   Pulmonary exam normal breath sounds clear to auscultation       Cardiovascular hypertension, Pt. on medications (-) angina + CAD, + Past MI, + Cardiac Stents and +CHF  + dysrhythmias (RBBB)  Rhythm:Regular Rate:Normal  HLD  TTE 08/23/2023: IMPRESSIONS    1. Left ventricular ejection fraction, by estimation, is 60 to 65%. Left  ventricular ejection fraction by 2D MOD biplane is 61.8 %. The left  ventricle has normal function. The left ventricle has no regional wall  motion abnormalities. There is mild left  ventricular hypertrophy. Left ventricular diastolic parameters are  consistent with Grade II diastolic dysfunction (pseudonormalization).  Elevated left ventricular end-diastolic pressure. The E/e' is 24.   2. Right ventricular systolic function is mildly reduced. The right  ventricular size is normal. There is normal pulmonary artery systolic  pressure. The estimated right ventricular systolic pressure is 25.7 mmHg.   3. The mitral valve is abnormal. Trivial mitral valve regurgitation.  Moderate mitral annular calcification.   4. The aortic valve is tricuspid. Aortic valve regurgitation is not  visualized. Aortic valve area, by VTI measures 1.98 cm. Aortic valve mean  gradient measures 9.6 mmHg. Aortic valve Vmax measures 2.04 m/s.   5.  The inferior vena cava is normal in size with greater than 50%  respiratory variability, suggesting right atrial pressure of 3 mmHg.   Normal stress test 11/29/2021    Neuro/Psych neg Seizures PSYCHIATRIC DISORDERS Anxiety Depression    Takes MSContin  Neuromuscular disease (neuropathy) CVA    GI/Hepatic Neg liver ROS,neg GERD  ,,H/o SBO   Endo/Other  diabetes, Type 2, Insulin  Dependent, Oral Hypoglycemic Agents    Renal/GU negative Renal ROS     Musculoskeletal  (+) Arthritis ,    Abdominal   Peds  Hematology  (+) Blood dyscrasia, anemia Lab Results      Component                Value               Date                      WBC                      19.2 (H)            04/21/2024                HGB                      9.2 (L)             04/21/2024                HCT                      27.9 (L)  04/21/2024                MCV                      81.8                04/21/2024                PLT                      346                 04/21/2024              Anesthesia Other Findings Aphasic  Last prasugrel : yesterday  On insulin  gtt - Endotool  Reproductive/Obstetrics Uterine cancer                              Anesthesia Physical Anesthesia Plan  ASA: 4  Anesthesia Plan: General   Post-op Pain Management: Minimal or no pain anticipated   Induction: Intravenous  PONV Risk Score and Plan: 3 and Ondansetron , Dexamethasone  and Treatment may vary due to age or medical condition  Airway Management Planned: Oral ETT  Additional Equipment:   Intra-op Plan:   Post-operative Plan: Extubation in OR  Informed Consent: I have reviewed the patients History and Physical, chart, labs and discussed the procedure including the risks, benefits and alternatives for the proposed anesthesia with the patient or authorized representative who has indicated his/her understanding and acceptance.     Dental advisory given  Plan  Discussed with: CRNA and Anesthesiologist  Anesthesia Plan Comments: (Risks of general anesthesia discussed including, but not limited to, sore throat, hoarse voice, chipped/damaged teeth, injury to vocal cords, nausea and vomiting, allergic reactions, lung infection, heart attack, stroke, and death. All questions answered. )         Anesthesia Quick Evaluation

## 2024-04-21 NOTE — Transfer of Care (Signed)
 Immediate Anesthesia Transfer of Care Note  Patient: Brenda [name]  Procedure(s) Performed: MRI WITH ANESTHESIA  Patient Location: PACU  Anesthesia Type:General  Level of Consciousness: awake and alert   Airway & Oxygen  Therapy: Patient Spontanous Breathing and Patient connected to nasal cannula oxygen   Post-op Assessment: Report given to RN and Post -op Vital signs reviewed and stable  Post vital signs: Reviewed and stable  Last Vitals:  Vitals Value Taken Time  BP 129/61 04/21/24 12:52  Temp    Pulse 93 04/21/24 12:54  Resp 29 04/21/24 12:54  SpO2 95 % 04/21/24 12:54  Vitals shown include unfiled device data.  Last Pain:  Vitals:   04/21/24 1105  TempSrc:   PainSc: 8          Complications: No notable events documented.

## 2024-04-21 NOTE — Progress Notes (Signed)
 PROGRESS NOTE  Brenda [name] FMW:[id number] DOB: [birth year]   PCP: Lonn Hicks, MD  Patient is from: Home.  Lives at home with his 61 year old child.  Uses rollator at baseline.  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  In ED, stable vitals.  Labs consistent with DKA.  WBC 20.8 with left shift.  UA with bacteria.  Evaluated by neurology.  CT head and CT angio head and neck without acute or significant finding.  MRI brain ordered, and admitted for CVA workup.   Patient was not able to tolerate MRI with or without sedation.  MRI under general anesthesia ordered.  Subjective: Seen and examined earlier this morning.  Patient was started on insulin  drip and IV fluid for DKA overnight.  MRI brain with anesthesia ordered.  Patient reports feeling better but not a great historian.  She is awake but only oriented to husband.  She also call her daughter husband.  She reports some back pain.  She responds no to chest pain, shortness of breath, cough, GI or UTI symptoms.  Denies focal weakness or numbness.   Objective: Vitals:   04/20/24 2250 04/20/24 2300 04/21/24 0425 04/21/24 0737  BP:  (!) 146/49 (!) 140/59 (!) 119/59  Pulse: (!) 108  96 94  Resp: 18  18 20   Temp:   98.4 F (36.9 C) 98 F (36.7 C)  TempSrc:   Oral Oral  SpO2:   100% 100%  Weight:        Examination:  GENERAL: No apparent distress.  Nontoxic. HEENT: MMM.  Vision and hearing grossly intact.  NECK: Supple.  No apparent JVD.  RESP:  No IWOB.  Fair aeration bilaterally. CVS:  RRR. Heart sounds normal.   ABD/GI/GU: BS+. Abd soft, NTND.  MSK/EXT:  Moves extremities. No apparent deformity. No edema.  SKIN: no apparent skin lesion or wound NEURO: AA.  Oriented to self and husband.  Follows commands.  Seems to have paraphasia.  Otherwise, neuroexam nonfocal. PSYCH: Calm. Normal affect.   Consultants:  Neurology  Procedures: None  Microbiology summarized: Blood culture Urine culture  Assessment and plan: Acute encephalopathy/paraphasia: Etiology not clear but possibilities are polypharmacy (on multiple sedating meds), DKA, delirium, CVA, infection (leukocytosis) and metastatic disease.  CT head and CT angio head and neck without acute or significant finding.  VBG reassuring.  Neuroexam is nonfocal but she seems to have paraphasia.  -MRI brain with and without contrast under general anesthesia-scheduled for 11 AM. -Cut down on oxycodone  and Lyrica . -Manage DKA as below -Blood culture, urine culture and UDS -Check B12, ammonia, RPR and TSH. -Follow further neuro recommendations -On full dose aspirin  per stroke team.  Follow A1c and LDL. -PT/OT/SLP eval   IDDM-2 with diabetic ketoacidosis: A1c 9.2% on 7/27. Recent Labs  Lab 04/20/24 2344 04/21/24 0538 04/21/24 0706 04/21/24 0815 04/21/24 0917  GLUCAP 267* 275* 186* 96 93  -Continue insulin  drip and IV fluids per protocol -BMP and BHA every 4 hours -Transition when DKA resolves. -Consult diabetic coordinator  Leukocytosis/bandemia: Unclear source of infection.  UA  with bacteria.  No respiratory or GI symptoms.  CXR without acute finding.  Does not seem to be on steroids.  CT head and CT angio head without acute finding.  No meningismus. -Check blood culture and urine culture -Follow MRI brain - May consider LP if no improvement in mental status and leukocytosis.  Stage IV endometrial cancer with mets to lung: diagnosed in 2023.  S/p hysterectomy and adjuvant radiation.  Recurrence/Admission last month with severe back pain and  imaging showing retroperitoneal lymph node disease and suspicious lung mass.  Biopsy of mass confirm adenocarcinoma.Follows with oncology, stage IV disease, port has been placed has been educated on chemotherapy with plan to start treatment soon.  Primary oncologist aware of patient's admission and will reschedule outpatient follow-up.  Cancer related pain: On MS Contin , oxycodone , Flexeril, Lyrica  at home. -Continue MS Contin  -Decreased oxycodone  and Lyrica  given mental status -Continue holding Flexeril -IV Dilaudid  as needed for severe breakthrough pain   History of CAD s/p PTCA/DES to mid LAD in 10/2019.  No cardiopulmonary symptoms. - Continue home Lipitor , Effient , losartan  when able to take p.o.  Chronic diastolic CHF: Last TTE with LVEF of 60 to 65% and G2 DD and mildly reduced RV SF.  Appears euvolemic on exam.  No cardiopulmonary symptoms. -Closely monitor fluid and respiratory status while on IV fluid for DKA -Hold home diuretics -Resume GDMT when able to take p.o. 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  Hyponatremia: Mild  Hypokalemia -Monitor replenish K and Mg as appropriate  Anemia of chronic disease -Continue monitoring  Physical deconditioning: Lives alone.  Uses rollator at baseline. -PT/OT eval  Lactic acidosis: Likely due to DKA.  Has to rule out infection.  Improved. -Continue monitoring -Infectious workup as above   Class I obesity Body mass index is 34.92 kg/m.           DVT prophylaxis:  SCDs Start: 04/20/24 1506  Code Status: Full code Family Communication: Updated patient's husband and daughter at bedside Level of care: Progressive Status is: Observation The patient will require care  spanning > 2 midnights and should be moved to inpatient because: Acute encephalopathy, strokelike symptoms, DKA and leukocytosis   Final disposition: To be determined   55 minutes with more than 50% spent in reviewing records, counseling patient/family and coordinating care.   Sch Meds:  Scheduled Meds:  aspirin   325 mg Oral Daily   atorvastatin   80 mg Oral QPM   buPROPion   150 mg Oral q AM   FLUoxetine   40 mg Oral Daily   morphine   15 mg Oral QHS   pregabalin   100 mg Oral TID   sodium chloride  flush  3 mL Intravenous Q12H   Continuous Infusions:  dextrose  5% lactated ringers  125 mL/hr at 04/21/24 0709   insulin  0.7 Units/hr (04/21/24 0918)   PRN Meds:.acetaminophen  **OR** acetaminophen , dextrose , HYDROmorphone  (DILAUDID ) injection, oxyCODONE , polyethylene glycol  Antimicrobials: Anti-infectives (From admission, onward)    None        I have personally reviewed the following labs and images: CBC: Recent Labs  Lab 04/20/24 1344 04/20/24 1351 04/21/24 0453  WBC 20.8*  --  19.2*  NEUTROABS 18.9*  --   --   HGB 10.3* 10.9* 9.2*  HCT 32.2* 32.0* 27.9*  MCV  84.3  --  81.8  PLT 334  --  346   BMP &GFR Recent Labs  Lab 04/20/24 1344 04/20/24 1351 04/20/24 2250 04/20/24 2342 04/21/24 0453  NA 132* 131* 132*  --  133*  K 4.0 3.9 3.2* 3.2* 4.4  CL 95* 96* 96*  --  95*  CO2 15*  --  19*  --  23  GLUCOSE 319* 326* 270*  --  267*  BUN 11 12 14   --  13  CREATININE 1.00 0.50 1.04*  --  0.84  CALCIUM  8.5*  --  8.6*  --  8.6*   Estimated Creatinine Clearance: 72.7 mL/min (by C-G formula based on SCr of 0.84 mg/dL). Liver & Pancreas: Recent Labs  Lab 04/20/24 1344 04/21/24 0453  AST 15 14*  ALT 12 11  ALKPHOS 134* 121  BILITOT 2.0* 1.2  PROT 6.8 5.0*  ALBUMIN 2.5* 2.2*   No results for input(s): LIPASE, AMYLASE in the last 168 hours. No results for input(s): AMMONIA in the last 168 hours. Diabetic: No results for input(s): HGBA1C in the last 72  hours. Recent Labs  Lab 04/20/24 2344 04/21/24 0538 04/21/24 0706 04/21/24 0815 04/21/24 0917  GLUCAP 267* 275* 186* 96 93   Cardiac Enzymes: No results for input(s): CKTOTAL, CKMB, CKMBINDEX, TROPONINI in the last 168 hours. No results for input(s): PROBNP in the last 8760 hours. Coagulation Profile: Recent Labs  Lab 04/20/24 1344  INR 1.2   Thyroid  Function Tests: No results for input(s): TSH, T4TOTAL, FREET4, T3FREE, THYROIDAB in the last 72 hours. Lipid Profile: No results for input(s): CHOL, HDL, LDLCALC, TRIG, CHOLHDL, LDLDIRECT in the last 72 hours. Anemia Panel: No results for input(s): VITAMINB12, FOLATE, FERRITIN, TIBC, IRON, RETICCTPCT in the last 72 hours. Urine analysis:    Component Value Date/Time   COLORURINE YELLOW 04/20/2024 1851   APPEARANCEUR CLEAR 04/20/2024 1851   LABSPEC 1.036 (H) 04/20/2024 1851   PHURINE 6.0 04/20/2024 1851   GLUCOSEU 50 (A) 04/20/2024 1851   HGBUR SMALL (A) 04/20/2024 1851   BILIRUBINUR NEGATIVE 04/20/2024 1851   BILIRUBINUR neg 08/16/2016 1147   KETONESUR 80 (A) 04/20/2024 1851   PROTEINUR NEGATIVE 04/20/2024 1851   UROBILINOGEN 2.0 08/16/2016 1147   UROBILINOGEN 2.0 (H) 02/22/2015 2143   NITRITE NEGATIVE 04/20/2024 1851   LEUKOCYTESUR NEGATIVE 04/20/2024 1851   Sepsis Labs: Invalid input(s): PROCALCITONIN, LACTICIDVEN  Microbiology: No results found for this or any previous visit (from the past 240 hours).  Radiology Studies: DG CHEST PORT 1 VIEW Result Date: 04/20/2024 CLINICAL DATA:  Leukocytosis EXAM: PORTABLE CHEST 1 VIEW COMPARISON:  04/19/2023 and CT chest from 04/03/2024 FINDINGS: Low lung volumes are present, causing crowding of the pulmonary vasculature. Scarring or atelectasis at both lung bases, right greater than left, with suspected elevated right hemidiaphragm. Right IJ Port-A-Cath tip: Right atrium. Coronary atherosclerosis with coronary stents. Probable mitral  valve calcification. Moderate degenerative glenohumeral arthropathy bilaterally. IMPRESSION: 1. Low lung volumes with bibasilar scarring or atelectasis, right greater than left. 2. Coronary atherosclerosis with coronary stents. 3. Probable mitral valve calcification. 4. Moderate degenerative glenohumeral arthropathy bilaterally. Electronically Signed   By: Ryan Salvage M.D.   On: 04/20/2024 16:49   CT ANGIO HEAD NECK W WO CM W PERF (CODE STROKE) Result Date: 04/20/2024 CLINICAL DATA:  Provided history: Neuro deficit, acute, stroke suspected. EXAM: CT ANGIOGRAPHY HEAD AND NECK CT PERFUSION BRAIN TECHNIQUE: Multidetector CT imaging of the head and neck was performed using the standard protocol during bolus administration of intravenous  contrast. Multiplanar CT image reconstructions and MIPs were obtained to evaluate the vascular anatomy. Carotid stenosis measurements (when applicable) are obtained utilizing NASCET criteria, using the distal internal carotid diameter as the denominator. Multiphase CT imaging of the brain was performed following IV bolus contrast injection. Subsequent parametric perfusion maps were calculated using RAPID software. RADIATION DOSE REDUCTION: This exam was performed according to the departmental dose-optimization program which includes automated exposure control, adjustment of the mA and/or kV according to patient size and/or use of iterative reconstruction technique. CONTRAST:  OMNIPAQUE  IOHEXOL  350 MG/ML SOLN COMPARISON:  Noncontrast head CT performed earlier today 04/20/2024. CT angiogram head 05/31/2016. Chest CT 04/03/2024. FINDINGS: CTA NECK FINDINGS Aortic arch: Standard aortic branching. Atherosclerotic plaque within the aortic arch and proximal major branch vessels of the neck. Streak/beam hardening artifact arising from a dense contrast bolus partially obscures the right subclavian artery. Within this limitation, there is no appreciable hemodynamically significant  innominate or proximal subclavian artery stenosis. Right carotid system: CCA and ICA patent within the neck without stenosis. Mild calcified atherosclerotic plaque about the carotid bifurcation. Left carotid system: CCA and ICA patent within the neck without stenosis. Mild calcified plaque within the proximal ICA. Vertebral arteries: Patent within the neck without stenosis or significant atherosclerotic disease. The right vertebral artery is dominant. Skeleton: The neck bilaterally. No acute fracture or aggressive osseous lesion. Other neck: Subcentimeter right thyroid  lobe nodule not meeting consensus criteria for ultrasound follow-up based on size. No follow-up imaging recommended. Reference: J Am Coll Radiol. 2015 Feb;12(2): 143-50. Mild nonspecific edema within Upper chest: Only faint residual ground-glass opacity remains at site of the 12 mm right upper lobe pulmonary nodule described on the recent prior chest CT of 04/03/2024 (series 5, image 341). A 6 mm part solid nodule more medially within the right upper lobe is unchanged (series 5, image 335). Right chest infusion port catheter. Review of the MIP images confirms the above findings CTA HEAD FINDINGS Anterior circulation: The intracranial internal carotid arteries are patent. Non-stenotic atherosclerotic plaque within both vessels. The M1 middle cerebral arteries are patent. No M2 proximal branch occlusion or high-grade proximal stenosis. The anterior cerebral arteries are patent. No intracranial aneurysm is identified. Posterior circulation: The intracranial vertebral arteries are patent. The basilar artery is patent. The posterior cerebral arteries are patent. Posterior communicating arteries are diminutive or absent, bilaterally. Venous sinuses: Within the limitations of contrast timing, no convincing thrombus. Anatomic variants: As described. Review of the MIP images confirms the above findings CT Brain Perfusion Findings: Poor source data/bolus  quality limits reliability of the perfusion estimates. Within this limitation, findings are as follows. CBF (<30%) Volume: 0mL Perfusion (Tmax>6.0s) volume: 8 mL (right temporal lobe). Mismatch Volume: 8mL Infarction Location:None identified. No emergent large vessel occlusion identified. These results were communicated to Dr. Michaela at 2:26 pmon 8/18/2025by text page via the [facility] [place] messaging system. IMPRESSION: CTA neck: 1. The common carotid and internal carotid arteries are patent within the neck without stenosis. Mild atherosclerotic plaque bilaterally, as described. 2. The vertebral arteries are patent within the neck without stenosis or significant atherosclerotic disease. 3. Aortic Atherosclerosis (ICD10-I70.0). 4. Only faint residual ground-glass opacity remains at site of the 12 mm right upper lobe pulmonary nodule described on the recent prior chest CT of 04/03/2024. 5. A 6 mm part-solid nodule more medially within the right upper lobe is unchanged. CTA head: 1. No proximal intracranial large vessel occlusion or high-grade proximal arterial stenosis is identified. 2. Non-stenotic atherosclerotic plaque within the  intracranial ICAs. CT perfusion head: Poor source data/bolus quality limits reliability of the perfusion estimates. Within this limitation, findings are as follows. The perfusion software identifies no core infarct. The perfusion software identifies an 8 mL region of hypoperfusion in the right temporal lobe (utilizing the Tmax>6 seconds threshold). Reported mismatch volume: 8 mL. Electronically Signed   By: Rockey Childs D.O.   On: 04/20/2024 14:27   CT HEAD CODE STROKE WO CONTRAST Result Date: 04/20/2024 CLINICAL DATA:  Provided history: Code stroke. Neuro deficit, acute, stroke suspected. EXAM: CT HEAD WITHOUT CONTRAST TECHNIQUE: Contiguous axial images were obtained from the base of the skull through the vertex without intravenous contrast. RADIATION DOSE REDUCTION: This exam was  performed according to the departmental dose-optimization program which includes automated exposure control, adjustment of the mA and/or kV according to patient size and/or use of iterative reconstruction technique. COMPARISON:  CT angiogram head 05/31/2016. FINDINGS: Brain: Mild generalized cerebral atrophy. There is no acute intracranial hemorrhage. No demarcated cortical infarct. No extra-axial fluid collection. No evidence of an intracranial mass. No midline shift. Vascular: No hyperdense vessel.  Atherosclerotic calcifications. Skull: No calvarial fracture or aggressive osseous lesion. Sinuses/Orbits: No mass or acute finding within the imaged orbits. Opacification of a single right ethmoid air cell. ASPECTS [facility] Stroke Program Early CT Score) - Ganglionic level infarction (caudate, lentiform nuclei, internal capsule, insula, M1-M3 cortex): 7 - Supraganglionic infarction (M4-M6 cortex): 3 Total score (0-10 with 10 being normal): 10 No evidence of an acute intracranial abnormality. These results were communicated to Dr. Michaela at 1:58 pmon 8/18/2025by text page via the [facility] messaging system. IMPRESSION: 1.  No evidence of an acute intracranial abnormality. 2. Mild generalized cerebral atrophy. 3. Mild right ethmoid sinusitis. Electronically Signed   By: Rockey Childs D.O.   On: 04/20/2024 14:00      Salome Hautala T. Alva Kuenzel [facility]  If 7PM-7AM, please contact night-coverage [email or web address] 04/21/2024, 10:07 AM

## 2024-04-21 NOTE — Anesthesia Procedure Notes (Signed)
 Procedure Name: Intubation Date/Time: 04/21/2024 11:57 AM  Performed by: Mannie Krystal LABOR, CRNAPre-anesthesia Checklist: Patient identified, Emergency Drugs available, Suction available and Patient being monitored Patient Re-evaluated:Patient Re-evaluated prior to induction Oxygen  Delivery Method: Circle system utilized Preoxygenation: Pre-oxygenation with 100% oxygen  Induction Type: IV induction Ventilation: Mask ventilation without difficulty Laryngoscope Size: Glidescope and 3 Grade View: Grade I Tube type: Oral Tube size: 7.0 mm Number of attempts: 1 Airway Equipment and Method: Stylet Placement Confirmation: ETT inserted through vocal cords under direct vision, positive ETCO2 and breath sounds checked- equal and bilateral Secured at: 22 cm Tube secured with: Tape Dental Injury: Teeth and Oropharynx as per pre-operative assessment

## 2024-04-21 NOTE — TOC CAGE-AID Note (Signed)
 Transition of Care [facility]) - CAGE-AID Screening   Patient Details  Name: Brenda [name] MRN: [id number] Date of Birth: 1961-06-29  [facility] CM/SW Contact:    Tarrin Menn E Lucien Budney, LCSW Phone Number: 04/21/2024, 9:34 AM   Clinical Narrative: No SA noted.  CAGE-AID Screening:    Have You Ever Felt You Ought to Cut Down on Your Drinking or Drug Use?: No Have People Annoyed You By Critizing Your Drinking Or Drug Use?: No Have You Felt Bad Or Guilty About Your Drinking Or Drug Use?: No Have You Ever Had a Drink or Used Drugs First Thing In The Morning to Steady Your Nerves or to Get Rid of a Hangover?: No CAGE-AID Score: 0  Substance Abuse Education Offered: No

## 2024-04-21 NOTE — Progress Notes (Signed)
 STROKE TEAM PROGRESS NOTE   INTERIM HISTORY/SUBJECTIVE Husband at the bedside. Pt still complaining of back pain. MRI under general anesthesia today showed no stroke or metastasis. Per husband, pt yesterday lethargic with soft voice and confusion. Of note, her glucose on presentation 400s and in DKA. Today, husband felt some improvement of her speech.    OBJECTIVE  CBC    Component Value Date/Time   WBC 19.2 (H) 04/21/2024 0453   RBC 3.41 (L) 04/21/2024 0453   HGB 9.2 (L) 04/21/2024 0453   HGB 13.2 01/25/2023 0945   HCT 27.9 (L) 04/21/2024 0453   HCT 39.2 01/25/2023 0945   PLT 346 04/21/2024 0453   PLT 293 01/25/2023 0945   MCV 81.8 04/21/2024 0453   MCV 86 01/25/2023 0945   MCH 27.0 04/21/2024 0453   MCHC 33.0 04/21/2024 0453   RDW 13.8 04/21/2024 0453   RDW 13.6 01/25/2023 0945   LYMPHSABS 1.0 04/20/2024 1344   LYMPHSABS 2.3 11/01/2021 1624   MONOABS 0.6 04/20/2024 1344   EOSABS 0.0 04/20/2024 1344   EOSABS 0.2 11/01/2021 1624   BASOSABS 0.1 04/20/2024 1344   BASOSABS 0.1 11/01/2021 1624    BMET    Component Value Date/Time   NA 133 (L) 04/21/2024 0453   NA 138 01/25/2023 0945   K 4.4 04/21/2024 0453   CL 95 (L) 04/21/2024 0453   CO2 23 04/21/2024 0453   GLUCOSE 267 (H) 04/21/2024 0453   BUN 13 04/21/2024 0453   BUN 13 01/25/2023 0945   CREATININE 0.84 04/21/2024 0453   CREATININE 0.83 03/29/2016 1030   CALCIUM  8.6 (L) 04/21/2024 0453   EGFR 100 01/25/2023 0945   GFRNONAA >60 04/21/2024 0453   GFRNONAA 81 03/29/2016 1030    IMAGING past 24 hours DG CHEST PORT 1 VIEW Result Date: 04/20/2024 CLINICAL DATA:  Leukocytosis EXAM: PORTABLE CHEST 1 VIEW COMPARISON:  04/19/2023 and CT chest from 04/03/2024 FINDINGS: Low lung volumes are present, causing crowding of the pulmonary vasculature. Scarring or atelectasis at both lung bases, right greater than left, with suspected elevated right hemidiaphragm. Right IJ Port-A-Cath tip: Right atrium. Coronary atherosclerosis with  coronary stents. Probable mitral valve calcification. Moderate degenerative glenohumeral arthropathy bilaterally. IMPRESSION: 1. Low lung volumes with bibasilar scarring or atelectasis, right greater than left. 2. Coronary atherosclerosis with coronary stents. 3. Probable mitral valve calcification. 4. Moderate degenerative glenohumeral arthropathy bilaterally. Electronically Signed   By: Ryan Salvage M.D.   On: 04/20/2024 16:49   CT ANGIO HEAD NECK W WO CM W PERF (CODE STROKE) Result Date: 04/20/2024 CLINICAL DATA:  Provided history: Neuro deficit, acute, stroke suspected. EXAM: CT ANGIOGRAPHY HEAD AND NECK CT PERFUSION BRAIN TECHNIQUE: Multidetector CT imaging of the head and neck was performed using the standard protocol during bolus administration of intravenous contrast. Multiplanar CT image reconstructions and MIPs were obtained to evaluate the vascular anatomy. Carotid stenosis measurements (when applicable) are obtained utilizing NASCET criteria, using the distal internal carotid diameter as the denominator. Multiphase CT imaging of the brain was performed following IV bolus contrast injection. Subsequent parametric perfusion maps were calculated using RAPID software. RADIATION DOSE REDUCTION: This exam was performed according to the departmental dose-optimization program which includes automated exposure control, adjustment of the mA and/or kV according to patient size and/or use of iterative reconstruction technique. CONTRAST:  OMNIPAQUE  IOHEXOL  350 MG/ML SOLN COMPARISON:  Noncontrast head CT performed earlier today 04/20/2024. CT angiogram head 05/31/2016. Chest CT 04/03/2024. FINDINGS: CTA NECK FINDINGS Aortic arch: Standard aortic branching. Atherosclerotic  plaque within the aortic arch and proximal major branch vessels of the neck. Streak/beam hardening artifact arising from a dense contrast bolus partially obscures the right subclavian artery. Within this limitation, there is no  appreciable hemodynamically significant innominate or proximal subclavian artery stenosis. Right carotid system: CCA and ICA patent within the neck without stenosis. Mild calcified atherosclerotic plaque about the carotid bifurcation. Left carotid system: CCA and ICA patent within the neck without stenosis. Mild calcified plaque within the proximal ICA. Vertebral arteries: Patent within the neck without stenosis or significant atherosclerotic disease. The right vertebral artery is dominant. Skeleton: The neck bilaterally. No acute fracture or aggressive osseous lesion. Other neck: Subcentimeter right thyroid  lobe nodule not meeting consensus criteria for ultrasound follow-up based on size. No follow-up imaging recommended. Reference: J Am Coll Radiol. 2015 Feb;12(2): 143-50. Mild nonspecific edema within Upper chest: Only faint residual ground-glass opacity remains at site of the 12 mm right upper lobe pulmonary nodule described on the recent prior chest CT of 04/03/2024 (series 5, image 341). A 6 mm part solid nodule more medially within the right upper lobe is unchanged (series 5, image 335). Right chest infusion port catheter. Review of the MIP images confirms the above findings CTA HEAD FINDINGS Anterior circulation: The intracranial internal carotid arteries are patent. Non-stenotic atherosclerotic plaque within both vessels. The M1 middle cerebral arteries are patent. No M2 proximal branch occlusion or high-grade proximal stenosis. The anterior cerebral arteries are patent. No intracranial aneurysm is identified. Posterior circulation: The intracranial vertebral arteries are patent. The basilar artery is patent. The posterior cerebral arteries are patent. Posterior communicating arteries are diminutive or absent, bilaterally. Venous sinuses: Within the limitations of contrast timing, no convincing thrombus. Anatomic variants: As described. Review of the MIP images confirms the above findings CT Brain Perfusion  Findings: Poor source data/bolus quality limits reliability of the perfusion estimates. Within this limitation, findings are as follows. CBF (<30%) Volume: 0mL Perfusion (Tmax>6.0s) volume: 8 mL (right temporal lobe). Mismatch Volume: 8mL Infarction Location:None identified. No emergent large vessel occlusion identified. These results were communicated to Dr. Michaela at 2:26 pmon 8/18/2025by text page via the [facility] messaging system. IMPRESSION: CTA neck: 1. The common carotid and internal carotid arteries are patent within the neck without stenosis. Mild atherosclerotic plaque bilaterally, as described. 2. The vertebral arteries are patent within the neck without stenosis or significant atherosclerotic disease. 3. Aortic Atherosclerosis (ICD10-I70.0). 4. Only faint residual ground-glass opacity remains at site of the 12 mm right upper lobe pulmonary nodule described on the recent prior chest CT of 04/03/2024. 5. A 6 mm part-solid nodule more medially within the right upper lobe is unchanged. CTA head: 1. No proximal intracranial large vessel occlusion or high-grade proximal arterial stenosis is identified. 2. Non-stenotic atherosclerotic plaque within the intracranial ICAs. CT perfusion head: Poor source data/bolus quality limits reliability of the perfusion estimates. Within this limitation, findings are as follows. The perfusion software identifies no core infarct. The perfusion software identifies an 8 mL region of hypoperfusion in the right temporal lobe (utilizing the Tmax>6 seconds threshold). Reported mismatch volume: 8 mL. Electronically Signed   By: Rockey Childs D.O.   On: 04/20/2024 14:27   CT HEAD CODE STROKE WO CONTRAST Result Date: 04/20/2024 CLINICAL DATA:  Provided history: Code stroke. Neuro deficit, acute, stroke suspected. EXAM: CT HEAD WITHOUT CONTRAST TECHNIQUE: Contiguous axial images were obtained from the base of the skull through the vertex without intravenous contrast. RADIATION DOSE  REDUCTION: This exam was performed according  to the departmental dose-optimization program which includes automated exposure control, adjustment of the mA and/or kV according to patient size and/or use of iterative reconstruction technique. COMPARISON:  CT angiogram head 05/31/2016. FINDINGS: Brain: Mild generalized cerebral atrophy. There is no acute intracranial hemorrhage. No demarcated cortical infarct. No extra-axial fluid collection. No evidence of an intracranial mass. No midline shift. Vascular: No hyperdense vessel.  Atherosclerotic calcifications. Skull: No calvarial fracture or aggressive osseous lesion. Sinuses/Orbits: No mass or acute finding within the imaged orbits. Opacification of a single right ethmoid air cell. ASPECTS [facility] Stroke Program Early CT Score) - Ganglionic level infarction (caudate, lentiform nuclei, internal capsule, insula, M1-M3 cortex): 7 - Supraganglionic infarction (M4-M6 cortex): 3 Total score (0-10 with 10 being normal): 10 No evidence of an acute intracranial abnormality. These results were communicated to Dr. Michaela at 1:58 pmon 8/18/2025by text page via the [facility] messaging system. IMPRESSION: 1.  No evidence of an acute intracranial abnormality. 2. Mild generalized cerebral atrophy. 3. Mild right ethmoid sinusitis. Electronically Signed   By: Rockey Childs D.O.   On: 04/20/2024 14:00    Vitals:   04/20/24 2250 04/20/24 2300 04/21/24 0425 04/21/24 0737  BP:  (!) 146/49 (!) 140/59 (!) 119/59  Pulse: (!) 108  96 94  Resp: 18  18 20   Temp:   98.4 F (36.9 C) 98 F (36.7 C)  TempSrc:   Oral Oral  SpO2:   100% 100%  Weight:         PHYSICAL EXAM General:  Alert, well-nourished, well-developed patient in mild acute distress of pain CV: Regular rate and rhythm on monitor Respiratory:  Regular, unlabored respirations on room air GI: Abdomen soft and nontender NEURO:  Pt lethargic but awake, alert, eyes open, orientated to place and people but not to age  or time. No aphasia, paucity of speech but answer all questions appropriately, following all simple commands, mild dysarthria. Able to name 2/4 and repeat. No gaze palsy, tracking bilaterally, visual field full, PERRL. No facial droop. Tongue midline. Bilateral UEs 4+/5, no drift. Bilaterally LEs proximal 2+/5 due to pain at the back, however, distal ankle PF/DF at least 4/5. Sensation symmetrical bilaterally, b/l FTN intact although mildly slow, gait not tested.    ASSESSMENT/PLAN  [name] is a 63 y.o. female with history of hypertension, hyperlipidemia, stage IV uterine cancer, diabetes, OSA on CPAP, chronic respiratory failure on home O2 CHF, CAD, CHF who presents from home via [facility] on 8/18. NIH on admission 6.  Likely metabolic encephalopathy in the setting of DKA, less likely TIA  Per husband, pt yesterday lethargic with soft voice and confusion. Of note, her glucose on presentation 400s and in DKA. Today, husband felt some improvement of her speech.  Code Stroke CT head No acute abnormality. Atrophy.  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 Follows with Dr. Lonn On home pain meds  Hypertension Home meds:   losartan  Stable Long term BP goal normotensive  Hyperlipidemia Home meds:  lipitor  80resumed in hospital LDL 40, goal < 70 On home lipitor  80 Continue statin at discharge  Other stroke risk factors Obesity with BMI 34.92 OSA on CPAP CAD on effient  at home, follows with Dr. Wonda CHF  Other active problems Chronic respiratory distress - On home O2 2L baseline  Hospital day # 0  Neurology will sign off. Please call with questions. No neuro follow up needed at this time. Thanks for the consult.  Ary Cummins, MD PhD Stroke Neurology 04/21/2024 3:43 PM    To contact Stroke Continuity provider, please refer to [email or web address]. After hours, contact General Neurology

## 2024-04-21 NOTE — Inpatient Diabetes Management (Signed)
 Inpatient Diabetes Program Recommendations  AACE/ADA: New Consensus Statement on Inpatient Glycemic Control (2015)  Target Ranges:  Prepandial:   less than 140 mg/dL      Peak postprandial:   less than 180 mg/dL (1-2 hours)      Critically ill patients:  140 - 180 mg/dL   Lab Results  Component Value Date   GLUCAP 93 04/21/2024   HGBA1C 9.2 (H) 03/29/2024    Review of Glycemic Control  Latest Reference Range & Units 04/21/24 05:38 04/21/24 07:06 04/21/24 08:15 04/21/24 09:17  Glucose-Capillary 70 - 99 mg/dL 724 (H) 813 (H) 96 93  (H): Data is abnormally high  Diabetes history: DM2 Outpatient Diabetes medications: Tresiba 20 units every day, Fiasp  5 units with BF and lunch and 8 units with dinner, Metformin XR 1000 mg BID Current orders for Inpatient glycemic control: IV insulin   Inpatient Diabetes Program Recommendations:    When MD is ready to transition to SQ insulin , might consider:  Semglee  12 units every day (administer 2 hours prior to discontinuing IV insulin ) Novolog  0-9 units Q4H  Thank you, Wyvonna Pinal, MSN, CDCES Diabetes Coordinator Inpatient Diabetes Program [phone] (team pager from 8a-5p)

## 2024-04-22 ENCOUNTER — Encounter: Payer: Self-pay | Admitting: Hematology and Oncology

## 2024-04-22 ENCOUNTER — Encounter (HOSPITAL_COMMUNITY): Payer: Self-pay | Admitting: Radiology

## 2024-04-22 DIAGNOSIS — I5032 Chronic diastolic (congestive) heart failure: Secondary | ICD-10-CM | POA: Diagnosis not present

## 2024-04-22 DIAGNOSIS — I2510 Atherosclerotic heart disease of native coronary artery without angina pectoris: Secondary | ICD-10-CM | POA: Diagnosis not present

## 2024-04-22 DIAGNOSIS — R29818 Other symptoms and signs involving the nervous system: Secondary | ICD-10-CM | POA: Diagnosis not present

## 2024-04-22 DIAGNOSIS — F419 Anxiety disorder, unspecified: Secondary | ICD-10-CM | POA: Diagnosis not present

## 2024-04-22 LAB — GLUCOSE, CAPILLARY
Glucose-Capillary: 174 mg/dL — ABNORMAL HIGH (ref 70–99)
Glucose-Capillary: 232 mg/dL — ABNORMAL HIGH (ref 70–99)
Glucose-Capillary: 236 mg/dL — ABNORMAL HIGH (ref 70–99)
Glucose-Capillary: 252 mg/dL — ABNORMAL HIGH (ref 70–99)
Glucose-Capillary: 261 mg/dL — ABNORMAL HIGH (ref 70–99)
Glucose-Capillary: 277 mg/dL — ABNORMAL HIGH (ref 70–99)
Glucose-Capillary: 83 mg/dL (ref 70–99)

## 2024-04-22 LAB — RENAL FUNCTION PANEL
Albumin: 2.2 g/dL — ABNORMAL LOW (ref 3.5–5.0)
Albumin: 2 g/dL — ABNORMAL LOW (ref 3.5–5.0)
Anion gap: 13 (ref 5–15)
Anion gap: 13 (ref 5–15)
BUN: 7 mg/dL (ref 6–20)
BUN: 9 mg/dL (ref 6–20)
CO2: 22 mmol/L (ref 22–32)
CO2: 25 mmol/L (ref 22–32)
Calcium: 8.7 mg/dL — ABNORMAL LOW (ref 8.9–10.3)
Calcium: 8.1 mg/dL — ABNORMAL LOW (ref 8.9–10.3)
Chloride: 101 mmol/L (ref 98–111)
Chloride: 97 mmol/L — ABNORMAL LOW (ref 98–111)
Creatinine, Ser: 0.79 mg/dL (ref 0.44–1.00)
Creatinine, Ser: 0.62 mg/dL (ref 0.44–1.00)
GFR, Estimated: >60 mL/min (ref >60)
GFR, Estimated: >60 mL/min (ref >60)
Glucose, Bld: 261 mg/dL — ABNORMAL HIGH (ref 70–99)
Glucose, Bld: 194 mg/dL — ABNORMAL HIGH (ref 70–99)
Phosphorus: 3.6 mg/dL (ref 2.5–4.6)
Phosphorus: 2.8 mg/dL (ref 2.5–4.6)
Potassium: 6.1 mmol/L — ABNORMAL HIGH (ref 3.5–5.1)
Potassium: 3.9 mmol/L (ref 3.5–5.1)
Sodium: 136 mmol/L (ref 135–145)
Sodium: 135 mmol/L (ref 135–145)

## 2024-04-22 LAB — CBC
HCT: 28.3 % — ABNORMAL LOW (ref 36.0–46.0)
Hemoglobin: 9 g/dL — ABNORMAL LOW (ref 12.0–15.0)
MCH: 26.8 pg (ref 26.0–34.0)
MCHC: 31.8 g/dL (ref 30.0–36.0)
MCV: 84.2 fL (ref 80.0–100.0)
Platelets: 326 K/uL (ref 150–400)
RBC: 3.36 MIL/uL — ABNORMAL LOW (ref 3.87–5.11)
RDW: 14 % (ref 11.5–15.5)
WBC: 19.1 K/uL — ABNORMAL HIGH (ref 4.0–10.5)
nRBC: 0 % (ref 0.0–0.2)

## 2024-04-22 LAB — URINE CULTURE: Culture: 80000 — AB

## 2024-04-22 LAB — MAGNESIUM: Magnesium: 2 mg/dL (ref 1.7–2.4)

## 2024-04-22 LAB — RPR: RPR Ser Ql: NONREACTIVE

## 2024-04-22 MED ORDER — SODIUM ZIRCONIUM CYCLOSILICATE 10 G PO PACK
10.0000 g | PACK | Freq: Four times a day (QID) | ORAL | Status: AC
  Administered 2024-04-22 (×2): 10 g via ORAL
  Filled 2024-04-22 (×2): qty 1

## 2024-04-22 MED ORDER — INSULIN ASPART 100 UNIT/ML IJ SOLN
3.0000 [IU] | Freq: Three times a day (TID) | INTRAMUSCULAR | Status: DC
  Administered 2024-04-22 – 2024-04-24 (×8): 3 [IU] via SUBCUTANEOUS

## 2024-04-22 MED ORDER — INSULIN ASPART 100 UNIT/ML IJ SOLN
0.0000 [IU] | Freq: Every day | INTRAMUSCULAR | Status: DC
  Administered 2024-04-23: 2 [IU] via SUBCUTANEOUS

## 2024-04-22 MED ORDER — PRASUGREL HCL 10 MG PO TABS
5.0000 mg | ORAL_TABLET | Freq: Every morning | ORAL | Status: DC
  Administered 2024-04-22 – 2024-04-24 (×3): 5 mg via ORAL
  Filled 2024-04-22 (×8): qty 1

## 2024-04-22 MED ORDER — SODIUM CHLORIDE 0.9 % IV BOLUS
500.0000 mL | Freq: Once | INTRAVENOUS | Status: AC
  Administered 2024-04-22: 500 mL via INTRAVENOUS

## 2024-04-22 MED ORDER — SACUBITRIL-VALSARTAN 24-26 MG PO TABS
1.0000 | ORAL_TABLET | Freq: Two times a day (BID) | ORAL | Status: DC
  Administered 2024-04-22 (×2): 1 via ORAL
  Filled 2024-04-22 (×3): qty 1

## 2024-04-22 MED ORDER — SODIUM CHLORIDE 0.9 % IV SOLN
2.0000 g | INTRAVENOUS | Status: DC
  Administered 2024-04-22 – 2024-04-24 (×3): 2 g via INTRAVENOUS
  Filled 2024-04-22 (×3): qty 20

## 2024-04-22 MED ORDER — INSULIN ASPART 100 UNIT/ML IJ SOLN
0.0000 [IU] | Freq: Three times a day (TID) | INTRAMUSCULAR | Status: DC
  Administered 2024-04-22: 8 [IU] via SUBCUTANEOUS
  Administered 2024-04-22: 11 [IU] via SUBCUTANEOUS
  Administered 2024-04-22 – 2024-04-23 (×2): 3 [IU] via SUBCUTANEOUS
  Administered 2024-04-23: 2 [IU] via SUBCUTANEOUS
  Administered 2024-04-23 – 2024-04-24 (×3): 3 [IU] via SUBCUTANEOUS

## 2024-04-22 MED ORDER — INSULIN ASPART 100 UNIT/ML IJ SOLN: 3.0000 [IU] | Freq: Three times a day (TID) | INTRAMUSCULAR | Status: DC

## 2024-04-22 MED ORDER — SODIUM CHLORIDE 0.9 % IV SOLN: INTRAVENOUS | Status: DC

## 2024-04-22 MED ORDER — INSULIN GLARGINE 100 UNIT/ML ~~LOC~~ SOLN
15.0000 [IU] | Freq: Every day | SUBCUTANEOUS | Status: DC
  Administered 2024-04-22 – 2024-04-24 (×3): 15 [IU] via SUBCUTANEOUS
  Filled 2024-04-22 (×3): qty 0.15

## 2024-04-22 NOTE — Progress Notes (Signed)
 PROGRESS NOTE  Brenda [name] FMW:[id number] DOB: 1961/06/14   PCP: Brenda Hicks, MD  Patient is from: Home.  Lives at home with his 61 year old child.  Uses rollator at baseline.  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  In ED, stable vitals.  Labs consistent with DKA.  WBC 20.8 with left shift.  UA with bacteria.  Evaluated by neurology.  CT head and CT angio head and neck without acute or significant finding.  MRI brain ordered, and admitted for CVA workup.  Also started on insulin  drip for DKA.  MRI brain and TTE without significant finding.  LDL 25.  A1c 9.3%.  Neurology recommended continuing home Effient  and signed off.  Subjective: Seen and examined earlier this morning.  No major events overnight of this morning.  No complaints but not a great historian.  He is awake and alert but only oriented to self.  He does not recognize her daughter at bedside.  She follows commands.  She denies pain and repeatedly says okay.  Daughter states that she was talking the whole night.   Objective: Vitals:   04/22/24 0026 04/22/24 0146 04/22/24 0458 04/22/24 0745  BP: (!) 129/58  (!) 118/52 (!) 122/53  Pulse: 93 88 85 86  Resp: 17 16 17 20   Temp: 98.3 F (36.8 C)  98.1 F (36.7 C) 97.9 F (36.6 C)  TempSrc: Oral  Oral Oral  SpO2: 99% 98% 100% 97%  Weight:      Height:        Examination:  GENERAL: No apparent distress.  Nontoxic. HEENT: MMM.  Vision and hearing grossly intact.  NECK: Supple.  No apparent JVD.  RESP:  No IWOB.  Fair aeration bilaterally. CVS:  RRR.  Heart sounds normal.  ABD/GI/GU: BS+. Abd soft, NTND.  MSK/EXT:  Moves extremities. No apparent deformity. No edema.  SKIN: no apparent skin lesion or wound NEURO: AA.  Oriented to self.  Follows commands.  Paraphasia?  Otherwise, neuroexam nonfocal. PSYCH: Calm.  Somewhat confused.  Consultants:  Neurology  Procedures: None  Microbiology summarized: Blood culture NGTD Urine culture pending  Assessment and plan: Acute metabolic encephalopathy/paraphasia: Etiology not clear but possibilities are polypharmacy (on multiple sedating meds), DKA, delirium and infection (leukocytosis) CT head, CTA head and neck and MRI brain with and without contrast unrevealing.  VBG, TSH, B12, ammonia and RPR unrevealing.  UDS positive for opiates.  Neuroexam is nonfocal but disoriented.  Culture data as above. -Treat DKA and possible UTI as below -Reduced home oxycodone  and Lyrica .  Holding home Lyrica . -SLP evaluation for speech and language -PT/OT/SLP eval   IDDM-2 with diabetic ketoacidosis: A1c 9.2% on 7/27. Recent Labs  Lab 04/21/24 1654 04/21/24 2012 04/22/24 0021 04/22/24 0455 04/22/24 0745  GLUCAP 151* 235* 232* 261* 252*  -Increase Semglee  from 12 to 15 units daily -SSI-moderate with night coverage -NovoLog  3 units 3 times daily with meals -Further adjustment as appropriate. -Consult diabetic coordinator  Leukocytosis/bandemia: UA with bacteria.  No respiratory or GI symptoms.  CXR without acute finding.  Blood cultures  NGTD. -Start ceftriaxone  for possible UTI empirically  Stage IV endometrial cancer with mets to lung: diagnosed in 2023.  S/p hysterectomy and adjuvant radiation.  Recurrence/Admission last month with severe back pain and imaging showing retroperitoneal lymph node disease and suspicious lung mass.  Biopsy of mass confirm adenocarcinoma.Follows with oncology, stage IV disease, port has been placed has been educated on chemotherapy with plan to start treatment soon.  MRI  brain without metastasis.  Primary oncologist aware of patient's admission and will reschedule outpatient follow-up.  Cancer related pain: On MS Contin , oxycodone , Flexeril, Lyrica  at home. -Continue MS Contin  -Decreased oxycodone  and Lyrica  given mental status -Continue holding Flexeril -IV Dilaudid  as needed for severe breakthrough pain   History of CAD s/p PTCA/DES to mid LAD in 10/2019.  No cardiopulmonary symptoms. -Continue home Lipitor , Effient , losartan    Chronic diastolic CHF: Last TTE with LVEF of 60 to 65% and G2 DD and mildly reduced RV SF.  Appears euvolemic on exam.  No cardiopulmonary symptoms. -Closely monitor fluid and respiratory status while on IV fluid for DKA -Hold home diuretics -Resume GDMT when able to take p.o. 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  Hyponatremia: Mild  Hypokalemia -Monitor replenish K and Mg as appropriate  Anemia of chronic disease -Continue monitoring  Physical deconditioning: Lives alone.  Uses rollator at baseline. -PT/OT-recommended SNF  Lactic acidosis: Likely due to DKA.  Resolved.  Hyperkalemia: K6.1. -P.o. Lokelma  10 g x 2 -Recheck renal panel at 3 PM.   Class I obesity Body mass index is 34.92 kg/m.           DVT prophylaxis:  SCDs Start: 04/20/24 1506  Code Status: Full code Family Communication: Updated patient's daughter at bedside. Level of care: Progressive Status is: Inpatient The patient will remain inpatient because: Acute metabolic encephalopathy, UTI  Final disposition: SNF   55 minutes with more than 50% spent in reviewing records, counseling patient/family and coordinating care.   Sch Meds:  Scheduled Meds:  atorvastatin   80 mg Oral QPM    buPROPion   150 mg Oral q AM   Chlorhexidine  Gluconate Cloth  6 each Topical Daily   FLUoxetine   40 mg Oral Daily   insulin  aspart  0-15 Units Subcutaneous TID WC   insulin  aspart  0-5 Units Subcutaneous QHS   insulin  aspart  3 Units Subcutaneous TID WC   insulin  glargine  15 Units Subcutaneous Daily   morphine   15 mg Oral QHS   pregabalin   50 mg Oral TID   sodium chloride  flush  3 mL Intravenous Q12H   sodium zirconium cyclosilicate   10 g Oral Q6H   Continuous Infusions:   PRN Meds:.acetaminophen  **OR** acetaminophen , dextrose , HYDROmorphone  (DILAUDID ) injection, oxyCODONE , polyethylene glycol  Antimicrobials: Anti-infectives (From admission, onward)    None        I have personally reviewed the following labs and images: CBC: Recent Labs  Lab 04/20/24 1344 04/20/24 1351 04/21/24 0453 04/22/24 0316  WBC 20.8*  --  19.2* 19.1*  NEUTROABS 18.9*  --   --   --   HGB 10.3* 10.9* 9.2* 9.0*  HCT 32.2* 32.0* 27.9* 28.3*  MCV 84.3  --  81.8 84.2  PLT 334  --  346 326   BMP &GFR Recent Labs  Lab  04/20/24 1344 04/20/24 1351 04/20/24 2250 04/20/24 2342 04/21/24 0453 04/21/24 1005 04/21/24 1541 04/22/24 0316  NA 132* 131* 132*  --  133* 133*  --  136  K 4.0 3.9 3.2* 3.2* 4.4 3.9  --  6.1*  CL 95* 96* 96*  --  95* 100  --  101  CO2 15*  --  19*  --  23 22  --  22  GLUCOSE 319* 326* 270*  --  267* 115*  --  261*  BUN 11 12 14   --  13 11  --  7  CREATININE 1.00 0.50 1.04*  --  0.84 0.76  --  0.79  CALCIUM  8.5*  --  8.6*  --  8.6* 8.3*  --  8.7*  MG  --   --   --   --   --   --  1.1* 2.0  PHOS  --   --   --   --   --   --  2.7 3.6   Estimated Creatinine Clearance: 76.4 mL/min (by C-G formula based on SCr of 0.79 mg/dL). Liver & Pancreas: Recent Labs  Lab 04/20/24 1344 04/21/24 0453 04/22/24 0316  AST 15 14*  --   ALT 12 11  --   ALKPHOS 134* 121  --   BILITOT 2.0* 1.2  --   PROT 6.8 5.0*  --   ALBUMIN 2.5* 2.2* 2.2*   No results for input(s): LIPASE,  AMYLASE in the last 168 hours. Recent Labs  Lab 04/21/24 1541  AMMONIA 13   Diabetic: Recent Labs    04/21/24 1005  HGBA1C 9.3*   Recent Labs  Lab 04/21/24 1654 04/21/24 2012 04/22/24 0021 04/22/24 0455 04/22/24 0745  GLUCAP 151* 235* 232* 261* 252*   Cardiac Enzymes: No results for input(s): CKTOTAL, CKMB, CKMBINDEX, TROPONINI in the last 168 hours. No results for input(s): PROBNP in the last 8760 hours. Coagulation Profile: Recent Labs  Lab 04/20/24 1344  INR 1.2   Thyroid  Function Tests: Recent Labs    04/21/24 1541  TSH 1.692   Lipid Profile: Recent Labs    04/21/24 1005  CHOL 67  HDL 27*  LDLCALC 25  TRIG 73  CHOLHDL 2.5   Anemia Panel: Recent Labs    04/21/24 1541  VITAMINB12 764   Urine analysis:    Component Value Date/Time   COLORURINE YELLOW 04/20/2024 1851   APPEARANCEUR CLEAR 04/20/2024 1851   LABSPEC 1.036 (H) 04/20/2024 1851   PHURINE 6.0 04/20/2024 1851   GLUCOSEU 50 (A) 04/20/2024 1851   HGBUR SMALL (A) 04/20/2024 1851   BILIRUBINUR NEGATIVE 04/20/2024 1851   BILIRUBINUR neg 08/16/2016 1147   KETONESUR 80 (A) 04/20/2024 1851   PROTEINUR NEGATIVE 04/20/2024 1851   UROBILINOGEN 2.0 08/16/2016 1147   UROBILINOGEN 2.0 (H) 02/22/2015 2143   NITRITE NEGATIVE 04/20/2024 1851   LEUKOCYTESUR NEGATIVE 04/20/2024 1851   Sepsis Labs: Invalid input(s): PROCALCITONIN, LACTICIDVEN  Microbiology: Recent Results (from the past 240 hours)  Culture, blood (Routine X 2) w Reflex to ID Panel     Status: None (Preliminary result)   Collection Time: 04/21/24 10:05 AM   Specimen: BLOOD LEFT HAND  Result Value Ref Range Status   Specimen Description BLOOD LEFT HAND  Final   Special Requests   Final    BOTTLES DRAWN AEROBIC AND ANAEROBIC Blood Culture adequate volume   Culture   Final    NO GROWTH < 24 HOURS Performed at [facility], [address] [place], [place] [address]    Report Status PENDING  Incomplete   Culture, blood (Routine X 2) w Reflex to ID Panel     Status: None (Preliminary result)   Collection Time: 04/21/24 10:05 AM   Specimen: BLOOD RIGHT HAND  Result Value Ref Range Status   Specimen Description BLOOD RIGHT HAND  Final   Special Requests   Final    BOTTLES DRAWN AEROBIC ONLY Blood Culture adequate volume   Culture   Final    NO GROWTH < 24 HOURS Performed at [facility], [address] [place], [place] [address]    Report Status PENDING  Incomplete    Radiology Studies: ECHOCARDIOGRAM COMPLETE Result Date: 04/21/2024    ECHOCARDIOGRAM REPORT   Patient Name:   Brenda [name] Crew Date of Exam: 04/21/2024 Medical Rec #:  [id number]           Height:       62.0 in Accession #:    [id number]          Weight:       190.9 lb Date of Birth:  1960/11/14           BSA:          1.874 m Patient Age:    62 years            BP:           119/59 mmHg Patient Gender: F                   HR:           95 bpm. Exam Location:  Inpatient Procedure: 2D Echo, Cardiac Doppler and Color Doppler (Both Spectral and Color            Flow Doppler were utilized during procedure).                                 MODIFIED REPORT:  This report was modified by Jerel Balding MD on 04/21/2024 due to Added saline                                 contrast report.  Indications:     Stroke  History:         Patient has prior history of Echocardiogram examinations, most                  recent 08/23/2023. CAD; Risk Factors:Hypertension, Dyslipidemia                  and Diabetes.  Sonographer:     Philomena Daring Referring Phys:  [id number] Diagnosing Phys: Jerel Balding MD IMPRESSIONS  1. Left ventricular ejection fraction, by estimation, is 60 to 65%. The left ventricle has normal function. The left ventricle has no regional wall motion abnormalities. Left ventricular diastolic parameters are consistent with Grade I diastolic dysfunction (impaired relaxation).  2. Right ventricular systolic function is mildly  reduced. The right ventricular size is normal. Tricuspid regurgitation signal is inadequate for assessing PA pressure.  3. Left atrial size was mildly dilated.  4. The mitral valve is degenerative. No evidence of mitral valve regurgitation. No evidence of mitral stenosis. Moderate mitral annular calcification.  5. The aortic valve is tricuspid. There is mild calcification of the aortic valve. There is moderate thickening of the  aortic valve. Aortic valve regurgitation is mild. Mild aortic valve stenosis.  6. The inferior vena cava is normal in size with greater than 50% respiratory variability, suggesting right atrial pressure of 3 mmHg.  7. Agitated saline contrast bubble study was negative, with no evidence of any interatrial shunt. Comparison(s): Prior images reviewed side by side. There is now mild aortic stenosis. FINDINGS  Left Ventricle: Left ventricular ejection fraction, by estimation, is 60 to 65%. The left ventricle has normal function. The left ventricle has no regional wall motion abnormalities. The left ventricular internal cavity size was normal in size. There is  no left ventricular hypertrophy. Left ventricular diastolic parameters are consistent with Grade I diastolic dysfunction (impaired relaxation). Right Ventricle: The right ventricular size is normal. No increase in right ventricular wall thickness. Right ventricular systolic function is mildly reduced. Tricuspid regurgitation signal is inadequate for assessing PA pressure. Left Atrium: Left atrial size was mildly dilated. Right Atrium: Right atrial size was not well visualized. Pericardium: There is no evidence of pericardial effusion. Mitral Valve: The mitral valve is degenerative in appearance. Moderate mitral annular calcification. No evidence of mitral valve regurgitation. No evidence of mitral valve stenosis. Tricuspid Valve: The tricuspid valve is normal in structure. Tricuspid valve regurgitation is not demonstrated. Aortic Valve: The  aortic valve is tricuspid. There is mild calcification of the aortic valve. There is moderate thickening of the aortic valve. Aortic valve regurgitation is mild. Mild aortic stenosis is present. Aortic valve mean gradient measures 13.7 mmHg. Aortic valve peak gradient measures 26.6 mmHg. Aortic valve area, by VTI measures 1.52 cm. Pulmonic Valve: The pulmonic valve was not well visualized. Pulmonic valve regurgitation is not visualized. No evidence of pulmonic stenosis. Aorta: The aortic root and ascending aorta are structurally normal, with no evidence of dilitation. Venous: The inferior vena cava is normal in size with greater than 50% respiratory variability, suggesting right atrial pressure of 3 mmHg. IAS/Shunts: No atrial level shunt detected by color flow Doppler. Agitated saline contrast was given intravenously to evaluate for intracardiac shunting. Agitated saline contrast bubble study was negative, with no evidence of any interatrial shunt.  LEFT VENTRICLE PLAX 2D LVIDd:         4.32 cm   Diastology LVIDs:         2.95 cm   LV e' medial:    6.31 cm/s LV PW:         1.32 cm   LV E/e' medial:  20.3 LV IVS:        1.08 cm   LV e' lateral:   7.62 cm/s LVOT diam:     2.08 cm   LV E/e' lateral: 16.8 LV SV:         68 LV SV Index:   36 LVOT Area:     3.40 cm  RIGHT VENTRICLE            IVC RV S prime:     8.92 cm/s  IVC diam: 1.22 cm TAPSE (M-mode): 1.8 cm LEFT ATRIUM             Index        RIGHT ATRIUM           Index LA diam:        3.28 cm 1.75 cm/m   RA Area:     12.50 cm LA Vol (A2C):   57.3 ml 30.57 ml/m  RA Volume:   26.10 ml  13.93 ml/m LA Vol (A4C):   39.3  ml 20.97 ml/m LA Biplane Vol: 48.8 ml 26.04 ml/m  AORTIC VALVE AV Area (Vmax):    1.41 cm AV Area (Vmean):   1.43 cm AV Area (VTI):     1.52 cm AV Vmax:           258.00 cm/s AV Vmean:          169.333 cm/s AV VTI:            0.444 m AV Peak Grad:      26.6 mmHg AV Mean Grad:      13.7 mmHg LVOT Vmax:         107.00 cm/s LVOT Vmean:         71.500 cm/s LVOT VTI:          0.199 m LVOT/AV VTI ratio: 0.45  AORTA Ao Root diam: 2.58 cm Ao Asc diam:  3.32 cm MITRAL VALVE MV Area (PHT): 4.21 cm     SHUNTS MV Decel Time: 180 msec     Systemic VTI:  0.20 m MV E velocity: 128.00 cm/s  Systemic Diam: 2.08 cm MV A velocity: 169.00 cm/s MV E/A ratio:  0.76 Mihai Croitoru MD Electronically signed by Jerel Balding MD Signature Date/Time: 04/21/2024/3:56:00 PM    Final (Updated)    MR Brain W and Wo Contrast Result Date: 04/21/2024 EXAM: MRI BRAIN WITH AND WITHOUT CONTRAST 04/21/2024 12:43:21 PM TECHNIQUE: Multiplanar multisequence MRI of the head/brain was performed with and without the administration of intravenous contrast. COMPARISON: CT of the head dated 04/20/2024. CLINICAL HISTORY: Aphasia. 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 FINDINGS: BRAIN AND VENTRICLES: No acute infarct. No acute intracranial hemorrhage. No mass effect or midline shift. No hydrocephalus. The sella is unremarkable. Normal flow voids. No mass or abnormal enhancement. ORBITS: No acute abnormality. SINUSES: No acute abnormality. BONES AND SOFT TISSUES: Normal bone marrow signal and enhancement. No acute soft tissue abnormality. IMPRESSION: 1. No acute intracranial abnormality. 2. No mass or abnormal enhancement. Electronically signed by: Evalene Coho MD 04/21/2024 12:49 PM EDT RP Workstation: [id number]      Tanish Sinkler T. Hannahmarie Asberry [facility]  If 7PM-7AM, please contact night-coverage [email or web address] 04/22/2024, 9:33 AM

## 2024-04-22 NOTE — TOC Initial Note (Addendum)
 Transition of Care [facility]) - Initial/Assessment Note    Patient Details  Name: Brenda [name] MRN: [id number] Date of Birth: February 02, 1961  [facility] CM/SW Contact:    Andrez JULIANNA George, RN Phone Number: 04/22/2024, 3:37 PM  Clinical Narrative:                 medical history significant of hypertension, lipidemia, diabetes, CAD, CHF, anxiety, depression, obesity, OSA, chronic respiratory failure, endometrial carcinoma presenting with aphasia   She is from home with her nephew. She is married but her spouse lives separately. Spouse is in room and willing to assist her after d/c.  Spouse says he can assist her with transportation.  Pt was managing her own medications.  IP Care management following for d/c disposition.  SDOH Interventions Today    Flowsheet Row Most Recent Value  SDOH Interventions   Food Insecurity Interventions FindHelp Community Resources Referral, Inpatient TOC      Expected Discharge Plan:  (TBD) Barriers to Discharge: Continued Medical Work up   Patient Goals and CMS Choice            Expected Discharge Plan and Services   Discharge Planning Services: CM Consult   Living arrangements for the past 2 months: Single Family Home                                      Prior Living Arrangements/Services Living arrangements for the past 2 months: Single Family Home Lives with:: Relatives (nephew) Patient language and need for interpreter reviewed:: Yes Do you feel safe going back to the place where you live?: Yes          Current home services: DME (rollator/ shower seat/ oxygen  with [employer]/ CPAP) Criminal Activity/Legal Involvement Pertinent to Current Situation/Hospitalization: No - Comment as needed  Activities of Daily Living      Permission Sought/Granted                  Emotional Assessment Appearance:: Appears stated age Attitude/Demeanor/Rapport:  (confused) Affect (typically observed):  Quiet Orientation: : Oriented to Self   Psych Involvement: No (comment)  Admission diagnosis:  Aphasia [R47.01] Acute focal neurological deficit [R29.818] Stroke-like symptoms [R29.90] Patient Active Problem List   Diagnosis Date Noted   Stroke-like symptoms 04/21/2024   Acute focal neurological deficit 04/20/2024   Cancer associated pain 04/16/2024   Goals of care, counseling/discussion 04/16/2024   Other constipation 04/16/2024   Intractable low back pain 03/29/2024   Small bowel obstruction (HCC) 12/09/2021   SBO (small bowel obstruction) (HCC) 12/08/2021   Endometrial adenocarcinoma (HCC) 12/06/2021   Nonspecific chest pain 10/19/2021   Dysfunctional uterine bleeding    Symptomatic anemia    Chronic respiratory failure (HCC) 03/22/2021   (HFpEF) heart failure with preserved ejection fraction (HCC) 02/09/2021   Old MI (myocardial infarction) 10/26/2019   Anxiety and depression 03/28/2016   Type 2 diabetes mellitus with complication, with long-term current use of insulin  (HCC) 02/23/2015   Morbid obesity (HCC) 02/22/2015   Dyspnea 03/11/2013   Hyperlipidemia associated with type 2 diabetes mellitus (HCC) 05/25/2009   CAD (coronary artery disease) 05/25/2009   Essential hypertension 05/24/2009   OSA treated with BiPAP 05/24/2009   PCP:  Lonn Hicks, MD Pharmacy:   [facility] [address] [address] Phone: [phone] Fax: [phone]  [facility]  [address] [place] [address] Phone: [phone] Fax: [phone]     Social Drivers of Health (SDOH) Social History: SDOH Screenings   Food Insecurity: Food Insecurity Present (04/20/2024)  Housing: Unknown (04/20/2024)  Transportation Needs: Patient Unable To Answer (04/20/2024)  Utilities: Patient Unable To Answer (04/20/2024)  Depression (PHQ2-9): Low Risk  (09/25/2023)  Financial Resource Strain: High Risk (02/10/2021)  Tobacco  Use: Medium Risk (04/21/2024)   SDOH Interventions:     Readmission Risk Interventions     No data to display

## 2024-04-22 NOTE — Evaluation (Signed)
 Clinical/Bedside Swallow Evaluation Patient Details  Name: Brenda [name] MRN: [id number] Date of Birth: [birth year]  Today's Date: 04/22/2024 Time: SLP Start Time (ACUTE ONLY): 1025 SLP Stop Time (ACUTE ONLY): 1036 SLP Time Calculation (min) (ACUTE ONLY): 11 min  Past Medical History:  Past Medical History:  Diagnosis Date   Anemia    Arthritis    back, hands, hips (02/22/2015)   CAD (coronary artery disease)    a. complex LAD/diagonal bifurcation PCI in 2010. b. STEMI 10/2019 s/p PTCA/DES x1 to mLAD overlapping the old stent, residual disease treated medicaly.   Cancer [facility])    uterine   CHF (congestive heart failure) (HCC)    Depression    Diabetes mellitus (HCC)    started when I was pregnant; not sure if it was type 1 or type 2    History of radiation therapy    Endometrium- HDR 01/22/22-03/07/22- Dr. Lynwood Nasuti   Hypercholesterolemia    Hypertension    Morbid obesity (HCC)    Myocardial infarction (HCC)    mild x 3   Neuromuscular disorder (HCC)    neuropathy feet   Sleep apnea    cpap/   Past Surgical History:  Past Surgical History:  Procedure Laterality Date   CARDIAC CATHETERIZATION  12/02/2012   CHOLECYSTECTOMY OPEN  09/04/1987   CORONARY ANGIOPLASTY WITH STENT PLACEMENT  09/03/2009   CORONARY/GRAFT ACUTE MI REVASCULARIZATION N/A 10/26/2019   Procedure: CORONARY/GRAFT ACUTE MI REVASCULARIZATION;  Surgeon: Verlin Lonni BIRCH, MD;  Location: [facility] INVASIVE CV LAB;  Service: Cardiovascular;  Laterality: N/A;   INCISIONAL HERNIA REPAIR N/A 12/09/2021   Procedure: LAPAROSCOPIC REPAIR UMBILICAL HERNIA WITH MESH;  Surgeon: Vernetta Berg, MD;  Location: [facility] ORS;  Service: General;  Laterality: N/A;   IR IMAGING GUIDED PORT INSERTION  04/08/2024   LEFT HEART CATH AND CORONARY ANGIOGRAPHY N/A 10/26/2019   Procedure: LEFT HEART CATH AND CORONARY ANGIOGRAPHY;  Surgeon: Verlin Lonni BIRCH, MD;  Location: [facility] INVASIVE CV LAB;  Service: Cardiovascular;   Laterality: N/A;   LEFT HEART CATHETERIZATION WITH CORONARY ANGIOGRAM N/A 12/25/2012   Procedure: LEFT HEART CATHETERIZATION WITH CORONARY ANGIOGRAM;  Surgeon: Ozell Fell, MD;  Location: [facility] CATH LAB;  Service: Cardiovascular;  Laterality: N/A;   RADIOLOGY WITH ANESTHESIA N/A 04/21/2024   Procedure: MRI WITH ANESTHESIA;  Surgeon: Radiologist, Medication, MD;  Location: [facility] OR;  Service: Radiology;  Laterality: N/A;  BRAIN W/ W/O   ROBOTIC ASSISTED TOTAL HYSTERECTOMY WITH BILATERAL SALPINGO OOPHERECTOMY N/A 12/06/2021   Procedure: XI ROBOTIC ASSISTED TOTAL HYSTERECTOMY WITH BILATERAL SALPINGO OOPHORECTOMY;  Surgeon: [name] SAUNDERS, MD;  Location: [facility] ORS;  Service: Gynecology;  Laterality: N/A;   UMBILICAL HERNIA REPAIR  05/04/1989   doesn't think they used mesh   HPI:  63 y.o. female presents to [facility] hospital on 04/20/2024 with aphasia. Pt noted to be hyperglycemic and with concern for DKA. MRI brain negative for acute processes. PMH includes HTN, lipidemia, DM, CAD, CHF, anxiety, depression, OSA, chronic respiratory failure, metastatic endometrial carcinoma.    Assessment / Plan / Recommendation  Clinical Impression  Pt able to follow simple commands for oromotor assessment revealing functional ROM. Majority dentition intact, missing several mandibular right. RN reported pt consumed  pills with water without over difficulties. She consumed water via straw, applesauce with brisk appearing swallow and no s/s aspiration. Mastication was timely without residue across trials. Pt can continue regular texture, thin liquids, pills with water and no follow up needed for swallow. SLE ordered and will be completed as  soon as able as pt is continuing to have expressive and receptive language impairments. SLP Visit Diagnosis: Dysphagia, unspecified (R13.10)    Aspiration Risk  No limitations    Diet Recommendation Regular;Thin liquid    Liquid Administration via: Cup;Straw Medication Administration:  Whole meds with liquid Supervision: Patient able to self feed;Intermittent supervision to cue for compensatory strategies Compensations: Slow rate;Small sips/bites Postural Changes: Seated upright at 90 degrees    Other  Recommendations Oral Care Recommendations: Oral care BID     Assistance Recommended at Discharge    Functional Status Assessment Patient has not had a recent decline in their functional status  Frequency and Duration            Prognosis        Swallow Study   General Date of Onset: 04/22/24 HPI: 63 y.o. female presents to [facility] hospital on 04/20/2024 with aphasia. Pt noted to be hyperglycemic and with concern for DKA. MRI brain negative for acute processes. PMH includes HTN, lipidemia, DM, CAD, CHF, anxiety, depression, OSA, chronic respiratory failure, metastatic endometrial carcinoma. Type of Study: Bedside Swallow Evaluation Previous Swallow Assessment:  (none) Diet Prior to this Study: Regular;Thin liquids (Level 0) Temperature Spikes Noted: No Respiratory Status: Nasal cannula History of Recent Intubation: No Behavior/Cognition: Alert;Cooperative;Pleasant mood;Requires cueing Oral Cavity Assessment: Within Functional Limits Oral Care Completed by SLP: No Oral Cavity - Dentition: Missing dentition (missing several lower right) Vision: Functional for self-feeding Self-Feeding Abilities: Able to feed self Patient Positioning: Upright in bed Baseline Vocal Quality: Normal Volitional Cough: Weak    Oral/Motor/Sensory Function Overall Oral Motor/Sensory Function: Within functional limits   Ice Chips Ice chips: Not tested   Thin Liquid Thin Liquid: Within functional limits Presentation: Straw    Nectar Thick Nectar Thick Liquid: Not tested   Honey Thick Honey Thick Liquid: Not tested   Puree Puree: Within functional limits   Solid     Solid: Within functional limits      Dustin, Olam Bull 04/22/2024,10:53 AM

## 2024-04-22 NOTE — Evaluation (Signed)
 Occupational Therapy Evaluation Patient Details Name: Brenda [name] MRN: [id number] DOB: 1960-11-27 Today's Date: 04/22/2024   History of Present Illness   63 y.o. female presents to [facility] hospital on 04/20/2024 with aphasia, code stroke .MRI brain negative for acute processes. Pt noted to be hyperglycemic with labs consistent with DKA. PMH includes HTN, lipidemia, DM, CAD, CHF, anxiety, depression, OSA, chronic respiratory failure, metastatic endometrial carcinoma.     Clinical Impressions PTA patient independent with ADLs, mobility using rollator.  Family reports she was driving and managing medications.  Admitted for above and presents with problem list below.  She requires supervision for bed mobility, min assist for transfers and mobility using RW in room, and min to mod assist for Adls.  She is oriented to self, and place with choices but unable to state correct month given 2 choices.  She is able to follow simple commands with increased time, but requires step by step cueing to sequence ADLs. Requires cueing for safety and problem solving, to avoid obstacles when walking in room.  Further visual assessment required. Based on performance today, pt will best benefit from continued OT services acutely and after dc at [facility] level to optimize independence and safety with ADLs, IADLs and mobility. If pt does not have 24/7 support, would need <3hrs/day inpatient setting. Will follow acutely.       If plan is discharge home, recommend the following:   A little help with walking and/or transfers;A lot of help with bathing/dressing/bathroom;Assistance with cooking/housework;Direct supervision/assist for medications management;Direct supervision/assist for financial management;Assist for transportation;Help with stairs or ramp for entrance;Supervision due to cognitive status     Functional Status Assessment   Patient has had a recent decline in their functional status and demonstrates the  ability to make significant improvements in function in a reasonable and predictable amount of time.     Equipment Recommendations   Tub/shower bench     Recommendations for Other Services   Speech consult     Precautions/Restrictions   Precautions Precautions: Fall Recall of Precautions/Restrictions: Impaired Restrictions Weight Bearing Restrictions Per Provider Order: No     Mobility Bed Mobility Overal bed mobility: Needs Assistance Bed Mobility: Supine to Sit, Sit to Supine     Supine to sit: Supervision Sit to supine: Supervision        Transfers Overall transfer level: Needs assistance Equipment used: Rolling walker (2 wheels) Transfers: Sit to/from Stand Sit to Stand: Min assist           General transfer comment: cueing for hand placement, min assist for safety      Balance Overall balance assessment: Needs assistance Sitting-balance support: No upper extremity supported, Feet supported Sitting balance-Leahy Scale: Good     Standing balance support: Bilateral upper extremity supported, During functional activity Standing balance-Leahy Scale: Poor                             ADL either performed or assessed with clinical judgement   ADL Overall ADL's : Needs assistance/impaired     Grooming: Minimal assistance;Standing;Wash/dry hands Grooming Details (indicate cue type and reason): cueing to sequence steps         Upper Body Dressing : Moderate assistance;Sitting   Lower Body Dressing: Moderate assistance;Sit to/from stand   Toilet Transfer: Minimal assistance;Ambulation;Rolling walker (2 wheels)           Functional mobility during ADLs: Minimal assistance;Rolling walker (2 wheels);Cueing for safety;Cueing for sequencing  Vision   Additional Comments: pt able to locate correct # of fingers in quadrants given increased time but does tend to run into obstacles during mobility in room, requires cueing to  locate items at sink.  family reprots Left eye surgery recently (and hasn't had follow up).     Perception         Praxis         Pertinent Vitals/Pain Pain Assessment Pain Assessment: No/denies pain     Extremity/Trunk Assessment Upper Extremity Assessment Upper Extremity Assessment: Generalized weakness   Lower Extremity Assessment Lower Extremity Assessment: Defer to PT evaluation   Cervical / Trunk Assessment Cervical / Trunk Assessment: Normal   Communication Communication Communication: Impaired Factors Affecting Communication: Difficulty expressing self   Cognition Arousal: Alert Behavior During Therapy: Flat affect Cognition: Cognition impaired   Orientation impairments: Time, Situation Awareness: Intellectual awareness impaired Memory impairment (select all impairments): Short-term memory, Working [employer] fast streamer, Conservation officer, historic buildings, Non-declarative long-term memory Attention impairment (select first level of impairment): Focused attention Executive functioning impairment (select all impairments): Initiation, Organization, Sequencing, Reasoning, Problem solving OT - Cognition Comments: pt oriented to self, requires choice for place but unable to determine month with choices.  Pt follows 1 step commands but unable to follow multiple step commands.  She requires assist to sequence ADL tasks and problem solve.                 Following commands: Impaired Following commands impaired: Follows one step commands with increased time, Follows multi-step commands inconsistently     Cueing  General Comments   Cueing Techniques: Verbal cues  VSS on 2L [place]   Exercises     Shoulder Instructions      Home Living Family/patient expects to be discharged to:: Private residence Living Arrangements: Other relatives Available Help at Discharge: Family;Available PRN/intermittently Type of Home: House Home Access: Ramped entrance     Home Layout: One level      Bathroom Shower/Tub: Chief Strategy Officer: Standard Bathroom Accessibility: Yes   Home Equipment: Rollator (4 wheels);Shower seat;Grab bars - tub/shower;Grab bars - toilet;Hand held shower head;Adaptive equipment Adaptive Equipment: Reacher        Prior Functioning/Environment Prior Level of Function : History of Falls (last six months);Independent/Modified Independent;Driving             Mobility Comments: pt reports losing her balance with recent fall prior to admission. pt's spouse feels her LLE may be buckling if retroperitoneal mass is compressing nerves in back- reports using Rollator at home ADLs Comments: Patient has not needed any assist for ADL/IADL.  Shares chores with nephew. On disability.    OT Problem List: Decreased activity tolerance;Pain;Obesity;Decreased strength;Impaired balance (sitting and/or standing);Decreased coordination;Decreased cognition;Decreased safety awareness;Decreased knowledge of use of DME or AE;Decreased knowledge of precautions   OT Treatment/Interventions: Self-care/ADL training;Therapeutic exercise;DME and/or AE instruction;Therapeutic activities;Patient/family education;Balance training;Cognitive remediation/compensation;Energy conservation;Visual/perceptual remediation/compensation      OT Goals(Current goals can be found in the care plan section)   Acute Rehab OT Goals Patient Stated Goal: unable to state OT Goal Formulation: Patient unable to participate in goal setting Time For Goal Achievement: 05/06/24 Potential to Achieve Goals: Good   OT Frequency:  Min 2X/week    Co-evaluation              AM-PAC OT 6 Clicks Daily Activity     Outcome Measure Help from another person eating meals?: A Little Help from another person taking care of personal grooming?: A  Little Help from another person toileting, which includes using toliet, bedpan, or urinal?: A Lot Help from another person bathing (including washing,  rinsing, drying)?: A Lot Help from another person to put on and taking off regular upper body clothing?: A Lot Help from another person to put on and taking off regular lower body clothing?: A Lot 6 Click Score: 14   End of Session Equipment Utilized During Treatment: Gait belt;Rolling walker (2 wheels);Oxygen  Nurse Communication: Mobility status  Activity Tolerance: Patient tolerated treatment well;Patient limited by pain Patient left: in bed;with call bell/phone within reach;with bed alarm set;with family/visitor present  OT Visit Diagnosis: Other abnormalities of gait and mobility (R26.89);Other symptoms and signs involving cognitive function;Cognitive communication deficit (R41.841)                Time: 8883-8855 OT Time Calculation (min): 28 min Charges:  OT General Charges $OT Visit: 1 Visit OT Evaluation $OT Eval Moderate Complexity: 1 Mod OT Treatments $Self Care/Home Management : 8-22 mins  Etta NOVAK, OT Acute Rehabilitation Services Office [phone] Secure Chat Preferred    Etta GORMAN Hope 04/22/2024, 1:12 PM

## 2024-04-22 NOTE — Progress Notes (Addendum)
 Physical Therapy Treatment Patient Details Name: Brenda [name] MRN: [id number] DOB: [birth year] Today's Date: 04/22/2024   History of Present Illness 63 y.o. female presents to [facility] hospital on 04/20/2024 with aphasia, code stroke .MRI brain negative for acute processes. Pt noted to be hyperglycemic with labs consistent with DKA. PMH includes HTN, lipidemia, DM, CAD, CHF, anxiety, depression, OSA, chronic respiratory failure, metastatic endometrial carcinoma.   PT Comments  Pt progressing well towards acute PT goals. Pt needs step by step cueing to complete ADLs and to navigate around obstacles in the room. MinA occasionally needed during gait to assist with RW management. Encouraged pt to visually scan for objects in the room with increased difficulty. Continue to recommend <3hrs post acute rehab to reduce falls risk and improve mobility. If pt has 24/7 physical assist available, would recommend home with HHPT. Acute PT to follow.    SpO2 >94% on RA 2L O2 at baseline, returned to 2L at end of session for comfort   If plan is discharge home, recommend the following: A little help with walking and/or transfers;A little help with bathing/dressing/bathroom;Assistance with cooking/housework;Assist for transportation;Help with stairs or ramp for entrance   Can travel by private vehicle     Yes  Equipment Recommendations  None recommended by PT       Precautions / Restrictions Precautions Precautions: Fall Recall of Precautions/Restrictions: Impaired Restrictions Weight Bearing Restrictions Per Provider Order: No     Mobility  Bed Mobility Overal bed mobility: Needs Assistance Bed Mobility: Supine to Sit    Supine to sit: Contact guard, HOB elevated    General bed mobility comments: cues for sequencing, CGA for safety    Transfers Overall transfer level: Needs assistance Equipment used: Rolling walker (2 wheels) Transfers: Sit to/from Stand Sit to Stand: Contact guard assist     General transfer comment: for safety, cues for hand placement with poor follow through    Ambulation/Gait Ambulation/Gait assistance: Min assist Gait Distance (Feet): 50 Feet Assistive device: Rolling walker (2 wheels) Gait Pattern/deviations: Step-through pattern Gait velocity: reduced     General Gait Details: slowed step-through gait, occasionally needed MinA for RW management to negotiate around obstacles    Balance Overall balance assessment: Needs assistance Sitting-balance support: No upper extremity supported, Feet supported Sitting balance-Leahy Scale: Good     Standing balance support: Bilateral upper extremity supported, Reliant on assistive device for balance Standing balance-Leahy Scale: Poor     Communication Communication Communication: Impaired Factors Affecting Communication: Difficulty expressing self  Cognition Arousal: Alert Behavior During Therapy: WFL for tasks assessed/performed   PT - Cognitive impairments: Awareness, Memory, Attention, Problem solving, Safety/Judgement, Sequencing    PT - Cognition Comments: step by step cueing for mobility and ADLs with cues for safety Following commands: Intact      Cueing Cueing Techniques: Verbal cues  Exercises General Exercises - Lower Extremity Hip Flexion/Marching: AROM, Both, 10 reps, Standing Other Exercises Other Exercises: x5 serial STS with B UE and CGA        Pertinent Vitals/Pain Pain Assessment Pain Assessment: No/denies pain     PT Goals (current goals can now be found in the care plan section) Acute Rehab PT Goals Patient Stated Goal: to return to independence PT Goal Formulation: With patient/family Time For Goal Achievement: 05/05/24 Potential to Achieve Goals: Good Progress towards PT goals: Progressing toward goals    Frequency    Min 2X/week       AM-PAC PT 6 Clicks Mobility   Outcome Measure  Help needed turning from your back to your side while in a flat bed  without using bedrails?: A Little Help needed moving from lying on your back to sitting on the side of a flat bed without using bedrails?: A Little Help needed moving to and from a bed to a chair (including a wheelchair)?: A Little Help needed standing up from a chair using your arms (e.g., wheelchair or bedside chair)?: A Little Help needed to walk in hospital room?: A Little Help needed climbing 3-5 steps with a railing? : A Lot 6 Click Score: 17    End of Session Equipment Utilized During Treatment: Gait belt;Oxygen  Activity Tolerance: Patient tolerated treatment well Patient left: with family/visitor present;in chair;with call bell/phone within reach Nurse Communication: Mobility status PT Visit Diagnosis: Other abnormalities of gait and mobility (R26.89);Muscle weakness (generalized) (M62.81);Other symptoms and signs involving the nervous system (R29.898)     Time: 9171-9148 PT Time Calculation (min) (ACUTE ONLY): 23 min  Charges:    $Gait Training: 8-22 mins $Therapeutic Exercise: 8-22 mins PT General Charges $$ ACUTE PT VISIT: 1 Visit                     Kate ORN, PT, DPT Secure Chat Preferred  Rehab Office [phone]   Kate BRAVO Brenda [name] 04/22/2024, 10:36 AM

## 2024-04-23 ENCOUNTER — Encounter: Payer: Self-pay | Admitting: Hematology and Oncology

## 2024-04-23 DIAGNOSIS — R29818 Other symptoms and signs involving the nervous system: Secondary | ICD-10-CM | POA: Diagnosis not present

## 2024-04-23 DIAGNOSIS — I2510 Atherosclerotic heart disease of native coronary artery without angina pectoris: Secondary | ICD-10-CM | POA: Diagnosis not present

## 2024-04-23 DIAGNOSIS — I5032 Chronic diastolic (congestive) heart failure: Secondary | ICD-10-CM | POA: Diagnosis not present

## 2024-04-23 DIAGNOSIS — F419 Anxiety disorder, unspecified: Secondary | ICD-10-CM | POA: Diagnosis not present

## 2024-04-23 LAB — GLUCOSE, CAPILLARY
Glucose-Capillary: 179 mg/dL — ABNORMAL HIGH (ref 70–99)
Glucose-Capillary: 156 mg/dL — ABNORMAL HIGH (ref 70–99)
Glucose-Capillary: 146 mg/dL — ABNORMAL HIGH (ref 70–99)
Glucose-Capillary: 209 mg/dL — ABNORMAL HIGH (ref 70–99)
Glucose-Capillary: 226 mg/dL — ABNORMAL HIGH (ref 70–99)

## 2024-04-23 LAB — CBC
HCT: 24.7 % — ABNORMAL LOW (ref 36.0–46.0)
Hemoglobin: 7.8 g/dL — ABNORMAL LOW (ref 12.0–15.0)
MCH: 27.1 pg (ref 26.0–34.0)
MCHC: 31.6 g/dL (ref 30.0–36.0)
MCV: 85.8 fL (ref 80.0–100.0)
Platelets: 268 K/uL (ref 150–400)
RBC: 2.88 MIL/uL — ABNORMAL LOW (ref 3.87–5.11)
RDW: 14.2 % (ref 11.5–15.5)
WBC: 14.4 K/uL — ABNORMAL HIGH (ref 4.0–10.5)
nRBC: 0 % (ref 0.0–0.2)

## 2024-04-23 LAB — CORTISOL: Cortisol, Plasma: 2.2 ug/dL

## 2024-04-23 LAB — TSH: TSH: 2.314 u[IU]/mL (ref 0.350–4.500)

## 2024-04-23 LAB — MAGNESIUM: Magnesium: 1.4 mg/dL — ABNORMAL LOW (ref 1.7–2.4)

## 2024-04-23 MED ORDER — MAGNESIUM SULFATE 4 GM/100ML IV SOLN
4.0000 g | Freq: Once | INTRAVENOUS | Status: AC
  Administered 2024-04-23: 4 g via INTRAVENOUS
  Filled 2024-04-23: qty 100

## 2024-04-23 NOTE — Progress Notes (Signed)
 Physical Therapy Treatment Patient Details Name: Brenda [name] MRN: [id number] DOB: [birth year] Today's Date: 04/23/2024   History of Present Illness 63 y.o. female presents to [facility] hospital on 04/20/2024 with aphasia, code stroke .MRI brain negative for acute processes. Pt noted to be hyperglycemic with labs consistent with DKA. PMH includes HTN, lipidemia, DM, CAD, CHF, anxiety, depression, OSA, chronic respiratory failure, metastatic endometrial carcinoma.    PT Comments  Pt received sitting in recliner and agreeable to session with husband present. Pt reports improved confusion today. Pt able to complete transfers and 2 short gait trials with CGA for safety. Pt limited by LLE pain and fatigue. SpO2 >90% on RA, but noted DOE. Pt instructed in BLE exercises with encouragement to complete independently. Pt reports her son can provide 24/7 assist. Education on energy conservation, activity progression, and safety with pt verbalizing understanding. After discussion with supervising PT Kate ORN., discharge recommendations updated to HHPT. Pt continues to benefit from PT services to progress toward functional mobility goals.     If plan is discharge home, recommend the following: A little help with walking and/or transfers;A little help with bathing/dressing/bathroom;Assistance with cooking/housework;Assist for transportation;Help with stairs or ramp for entrance   Can travel by private vehicle     Yes  Equipment Recommendations  None recommended by PT    Recommendations for Other Services       Precautions / Restrictions Precautions Precautions: Fall Recall of Precautions/Restrictions: Impaired Restrictions Weight Bearing Restrictions Per Provider Order: No     Mobility  Bed Mobility               General bed mobility comments: Pt in recliner at beginning and end of session    Transfers Overall transfer level: Needs assistance Equipment used: Rolling walker (2  wheels) Transfers: Sit to/from Stand Sit to Stand: Contact guard assist           General transfer comment: cues for hand placement    Ambulation/Gait Ambulation/Gait assistance: Contact guard assist Gait Distance (Feet): 20 Feet (x2) Assistive device: Rolling walker (2 wheels) Gait Pattern/deviations: Step-through pattern, Decreased stride length Gait velocity: reduced     General Gait Details: slow, step-through gait with CGA for safety. 1 seated rest break due to fatigue   Stairs             Wheelchair Mobility     Tilt Bed    Modified Rankin (Stroke Patients Only)       Balance Overall balance assessment: Needs assistance Sitting-balance support: No upper extremity supported, Feet supported Sitting balance-Leahy Scale: Good     Standing balance support: Bilateral upper extremity supported, During functional activity Standing balance-Leahy Scale: Fair Standing balance comment: with RW support                            Communication Communication Communication: No apparent difficulties  Cognition Arousal: Alert Behavior During Therapy: Flat affect   PT - Cognitive impairments: No apparent impairments Difficult to assess due to: Impaired communication                       Following commands: Impaired Following commands impaired: Follows multi-step commands inconsistently    Cueing Cueing Techniques: Verbal cues  Exercises General Exercises - Lower Extremity Long Arc Quad: AROM, Seated, Both, 5 reps Hip Flexion/Marching: AROM, Seated, Both, 5 reps    General Comments        Pertinent Vitals/Pain  Pain Assessment Pain Assessment: Faces Faces Pain Scale: Hurts little more Pain Location: LLE Pain Descriptors / Indicators: Dull, Shooting Pain Intervention(s): Limited activity within patient's tolerance, Monitored during session, Repositioned     PT Goals (current goals can now be found in the care plan section) Acute  Rehab PT Goals Patient Stated Goal: to return to independence PT Goal Formulation: With patient/family Time For Goal Achievement: 05/05/24 Progress towards PT goals: Progressing toward goals    Frequency    Min 2X/week       AM-PAC PT 6 Clicks Mobility   Outcome Measure  Help needed turning from your back to your side while in a flat bed without using bedrails?: A Little Help needed moving from lying on your back to sitting on the side of a flat bed without using bedrails?: A Little Help needed moving to and from a bed to a chair (including a wheelchair)?: A Little Help needed standing up from a chair using your arms (e.g., wheelchair or bedside chair)?: A Little Help needed to walk in hospital room?: A Little Help needed climbing 3-5 steps with a railing? : A Lot 6 Click Score: 17    End of Session Equipment Utilized During Treatment: Gait belt;Oxygen  Activity Tolerance: Patient tolerated treatment well Patient left: with family/visitor present;in chair;with call bell/phone within reach Nurse Communication: Mobility status PT Visit Diagnosis: Other abnormalities of gait and mobility (R26.89);Muscle weakness (generalized) (M62.81);Other symptoms and signs involving the nervous system (R29.898)     Time: 8596-8579 PT Time Calculation (min) (ACUTE ONLY): 17 min  Charges:    $Gait Training: 8-22 mins PT General Charges $$ ACUTE PT VISIT: 1 Visit                    Darryle George, PTA Acute Rehabilitation Services Secure Chat Preferred  [phone]    Darryle George 04/23/2024, 3:35 PM

## 2024-04-23 NOTE — Plan of Care (Signed)
  Problem: Education: Goal: Ability to describe self-care measures that may prevent or decrease complications (Diabetes Survival Skills Education) will improve Outcome: Not Progressing Goal: Individualized Educational Video(s) Outcome: Not Progressing   Problem: Coping: Goal: Ability to adjust to condition or change in health will improve Outcome: Not Progressing   Problem: Fluid Volume: Goal: Ability to maintain a balanced intake and output will improve Outcome: Not Progressing   Problem: Health Behavior/Discharge Planning: Goal: Ability to identify and utilize available resources and services will improve Outcome: Not Progressing Goal: Ability to manage health-related needs will improve Outcome: Not Progressing   Problem: Metabolic: Goal: Ability to maintain appropriate glucose levels will improve Outcome: Not Progressing   Problem: Nutritional: Goal: Maintenance of adequate nutrition will improve Outcome: Not Progressing Goal: Progress toward achieving an optimal weight will improve Outcome: Not Progressing   Problem: Skin Integrity: Goal: Risk for impaired skin integrity will decrease Outcome: Not Progressing   Problem: Tissue Perfusion: Goal: Adequacy of tissue perfusion will improve Outcome: Not Progressing   Problem: Education: Goal: Knowledge of General Education information will improve Description: Including pain rating scale, medication(s)/side effects and non-pharmacologic comfort measures Outcome: Not Progressing   Problem: Health Behavior/Discharge Planning: Goal: Ability to manage health-related needs will improve Outcome: Not Progressing   Problem: Clinical Measurements: Goal: Ability to maintain clinical measurements within normal limits will improve Outcome: Not Progressing Goal: Will remain free from infection Outcome: Not Progressing Goal: Diagnostic test results will improve Outcome: Not Progressing Goal: Respiratory complications will  improve Outcome: Not Progressing Goal: Cardiovascular complication will be avoided Outcome: Not Progressing   Problem: Activity: Goal: Risk for activity intolerance will decrease Outcome: Not Progressing   Problem: Nutrition: Goal: Adequate nutrition will be maintained Outcome: Not Progressing   Problem: Coping: Goal: Level of anxiety will decrease Outcome: Not Progressing   Problem: Elimination: Goal: Will not experience complications related to bowel motility Outcome: Not Progressing Goal: Will not experience complications related to urinary retention Outcome: Not Progressing   Problem: Pain Managment: Goal: General experience of comfort will improve and/or be controlled Outcome: Not Progressing   Problem: Safety: Goal: Ability to remain free from injury will improve Outcome: Not Progressing   Problem: Skin Integrity: Goal: Risk for impaired skin integrity will decrease Outcome: Not Progressing   Problem: Education: Goal: Ability to describe self-care measures that may prevent or decrease complications (Diabetes Survival Skills Education) will improve Outcome: Not Progressing Goal: Individualized Educational Video(s) Outcome: Not Progressing   Problem: Cardiac: Goal: Ability to maintain an adequate cardiac output will improve Outcome: Not Progressing   Problem: Health Behavior/Discharge Planning: Goal: Ability to identify and utilize available resources and services will improve Outcome: Not Progressing Goal: Ability to manage health-related needs will improve Outcome: Not Progressing   Problem: Fluid Volume: Goal: Ability to achieve a balanced intake and output will improve Outcome: Not Progressing   Problem: Metabolic: Goal: Ability to maintain appropriate glucose levels will improve Outcome: Not Progressing   Problem: Nutritional: Goal: Maintenance of adequate nutrition will improve Outcome: Not Progressing Goal: Maintenance of adequate weight for  body size and type will improve Outcome: Not Progressing   Problem: Respiratory: Goal: Will regain and/or maintain adequate ventilation Outcome: Not Progressing   Problem: Urinary Elimination: Goal: Ability to achieve and maintain adequate renal perfusion and functioning will improve Outcome: Not Progressing

## 2024-04-23 NOTE — Inpatient Diabetes Management (Signed)
 Inpatient Diabetes Program Recommendations  AACE/ADA: New Consensus Statement on Inpatient Glycemic Control (2015)  Target Ranges:  Prepandial:   less than 140 mg/dL      Peak postprandial:   less than 180 mg/dL (1-2 hours)      Critically ill patients:  140 - 180 mg/dL   Lab Results  Component Value Date   GLUCAP 156 (H) 04/23/2024   HGBA1C 9.3 (H) 04/21/2024    Review of Glycemic Control  Latest Reference Range & Units 04/22/24 07:45 04/22/24 12:28 04/22/24 14:18 04/22/24 15:35 04/22/24 21:49 04/23/24 06:50 04/23/24 12:10  Glucose-Capillary 70 - 99 mg/dL 747 (H) 722 (H) 763 (H) 174 (H) 83 179 (H) 156 (H)  (H): Data is abnormally high  Diabetes history: DM2 Outpatient Diabetes medications: Lantus  22 units every day, Fiasp  4-6 units TID, Metformin XR 1000 mg bid, Freestyle Libre Current orders for Inpatient glycemic control: Lantus  15 units every day, Novolog  0-15 units TID and 0-5 units at bedtime, 3 units TID  Met with patient and son at bedside.  She confirms above home medications.  She does not skip doses.  She does not drink caloric beverages and she follows a pretty healthy diet.  Reviewed patient's current A1c of 9.3%. Explained what a A1c is and what it measures. Also reviewed goal A1c with patient, importance of good glucose control @ home, and blood sugar goals.  She was started on steroids at home by her oncologist (she cannot remember when).  She noticed her glucose trends increasing with the steroids and she states she had a UTI.  She is tearful about her cancer returning and her current situation.  Recommended she work with her oncologist and PCP to establish an insulin  regimen while receiving cancer treatment including steroids; she may need to increase doses.  Provided her with a replacement Freestyle Libre due to hers being removed for imaging.    Thank you, Wyvonna Pinal, MSN, CDCES Diabetes Coordinator Inpatient Diabetes Program [phone] (team pager from  8a-5p)

## 2024-04-23 NOTE — Progress Notes (Addendum)
 PROGRESS NOTE  Brenda [name] FMW:[id number] DOB: 1961/06/26   PCP: Lonn Hicks, MD  Patient is from: Home.  Lives at home with his 61 year old child.  Uses rollator at baseline.  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  In ED, stable vitals.  Labs consistent with DKA.  WBC 20.8 with left shift.  UA with bacteria.  Evaluated by neurology.  CT head and CT angio head and neck without acute or significant finding.  MRI brain ordered, and admitted for CVA workup.  Also started on insulin  drip for DKA.  MRI brain and TTE without significant finding.  LDL 25.  A1c 9.3%.  Neurology recommended continuing home Effient  and signed off.  Encephalopathy improved after starting ceftriaxone  for possible UTI.  Subjective: Seen and examined earlier this morning.  No major events overnight or this morning.  Awake and alert.  She is oriented x 4 except date today.  No complaints other than some back pain.  Asking about chemotherapy.  She states her cardiologist changed Entresto  to losartan  due to hypotension.  Daughter at bedside.  Objective: Vitals:   04/23/24 0015 04/23/24 0412 04/23/24 0731 04/23/24 1209  BP: (!) 103/43 (!) 102/38 (!) 127/52 (!) 115/48  Pulse: 85 79 83 84  Resp: 16 20 18 18   Temp: (!) 97.5 F (36.4 C) 97.7 F (36.5 C) 98.4 F (36.9 C) 98.3 F (36.8 C)  TempSrc: Oral Oral Oral Oral  SpO2: 100% 100% 100% 98%  Weight:      Height:        Examination:  GENERAL: No apparent distress.  Nontoxic. HEENT: MMM.  Vision and hearing grossly intact.  NECK: Supple.   No apparent JVD.  RESP:  No IWOB.  Fair aeration bilaterally. CVS:  RRR. Heart sounds normal.  ABD/GI/GU: BS+. Abd soft, NTND.  MSK/EXT:  Moves extremities. No apparent deformity. No edema.  SKIN: no apparent skin lesion or wound NEURO: AA.  Oriented to self.  Follows commands.  Paraphasia?  Otherwise, neuroexam nonfocal. PSYCH: Calm.  Somewhat confused.  Consultants:  Neurology  Procedures: None  Microbiology summarized: Blood culture NGTD Urine culture with lactobacillus species  Assessment and plan: Acute metabolic encephalopathy/paraphasia: Etiology not clear but possibilities are polypharmacy (on multiple sedating meds), DKA, delirium and infection (leukocytosis) CT head, CTA head and neck and MRI brain with and without contrast unrevealing.  VBG, TSH, B12, ammonia and RPR unrevealing.  UDS positive for opiates.  Neuroexam is nonfocal but disoriented.  Culture data as above.  Encephalopathy seems to be resolving.  She is currently awake and alert oriented x 4 except date. -Treat DKA and possible UTI as below -Reduced home oxycodone  and Lyrica .  Holding home Flexeril. -SLP evaluation for speech and language -PT/OT   IDDM-2 with diabetic ketoacidosis: A1c 9.2% on 7/27. Recent Labs  Lab 04/22/24 1418 04/22/24 1535 04/22/24 2149 04/23/24 0650 04/23/24 1210  GLUCAP 236* 174* 83 179* 156*  -Continue Semglee  15 units daily -SSI-moderate with night coverage -Continue NovoLog  3 units 3 times daily with meals -Further adjustment as appropriate.  UTI/leukocytosis/bandemia: UA with bacteria.  No respiratory or GI symptoms.  CXR without acute finding.  Blood cultures NGTD.  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  S/p hysterectomy and adjuvant radiation.  Recurrence/Admission last month with severe back pain and imaging showing retroperitoneal lymph node disease and suspicious lung mass.   Biopsy of mass confirm adenocarcinoma.Follows with oncology, stage IV disease, port has been placed has been educated on chemotherapy with plan to start treatment soon.  MRI brain without metastasis.  Primary oncologist aware of patient's admission and will reschedule outpatient follow-up.  Cancer related pain: On MS Contin , oxycodone , Flexeril, Lyrica  at home. -Continue MS Contin  -Decreased oxycodone  and Lyrica  given mental status -Continue holding Flexeril -IV Dilaudid  as needed for severe breakthrough pain   History of CAD s/p PTCA/DES to mid LAD in 10/2019.  No cardiopulmonary symptoms. -Continue home Lipitor , Effient  - Holding losartan  given hypotension.  She says she is not on Entresto .  Chronic diastolic CHF: Last TTE with LVEF of 60 to 65% and G2 DD and mildly reduced RV SF.  Appears euvolemic on exam.  No cardiopulmonary symptoms. -Closely monitor fluid and respiratory status while on IV fluid for DKA -Hold home diuretics -Resume GDMT when able to take p.o. safely  Hypotension/essential hypertension: Soft BP.  She tells me that her cardiologist changed Entresto  to losartan  due to hypotension.  Recent TTE with LVEF of 60 to 65%, G1 DD -Discontinue Entresto  -Monitor BP of IV fluid -Check TSH and cortisol-will add to morning blood draw.   Hyperlipidemia - Continue home atorvastatin    Anxiety and depression - Continue home bupropion  and fluoxetine  as able  OSA/chronic hypoxic RF: On CPAP and 2 L at baseline.  Stable. - Continue nightly CPAP  Hyponatremia: Resolved.  Hypokalemia/hypomagnesemia -Monitor replenish K and Mg as appropriate  Anemia of chronic disease -Continue monitoring  Physical deconditioning: Lives alone.  Uses rollator at baseline. -PT/OT-recommended SNF  Lactic acidosis: Likely due to DKA.  Resolved.  Hyperkalemia: Resolved.   Class I obesity Body mass index is 34.92 kg/m.           DVT prophylaxis:  SCDs Start: 04/20/24 1506  Code  Status: Full code Family Communication: Updated patient's daughter at bedside. Level of care: Progressive Status is: Inpatient The patient will remain inpatient because: Acute metabolic encephalopathy, UTI  Final disposition: SNF   55 minutes with more than 50% spent in reviewing records, counseling patient/family and coordinating care.   Sch Meds:  Scheduled Meds:  atorvastatin   80 mg Oral QPM   buPROPion   150 mg Oral q AM   Chlorhexidine  Gluconate Cloth  6 each Topical Daily   FLUoxetine   40 mg Oral Daily   insulin  aspart  0-15 Units Subcutaneous TID WC   insulin  aspart  0-5 Units Subcutaneous QHS   insulin  aspart  3 Units Subcutaneous TID WC   insulin  glargine  15 Units Subcutaneous Daily   morphine   15 mg Oral QHS   prasugrel   5 mg Oral q AM   pregabalin   50 mg Oral TID   sodium chloride  flush  3 mL Intravenous Q12H   Continuous Infusions:  cefTRIAXone  (ROCEPHIN )  IV 2 g (04/23/24 0856)  --  PRN Meds:.acetaminophen  **OR** acetaminophen , dextrose , HYDROmorphone  (DILAUDID ) injection, oxyCODONE , polyethylene glycol  Antimicrobials: Anti-infectives (From admission, onward)    Start     Dose/Rate Route Frequency Ordered Stop   04/22/24 1030  cefTRIAXone  (ROCEPHIN ) 2  g in sodium chloride  0.9 % 100 mL IVPB        2 g 200 mL/hr over 30 Minutes Intravenous Every 24 hours 04/22/24 0934 04/27/24 1029        I have personally reviewed the following labs and images: CBC: Recent Labs  Lab 04/20/24 1344 04/20/24 1351 04/21/24 0453 04/22/24 0316 04/23/24 0314  WBC 20.8*  --  19.2* 19.1* 14.4*  NEUTROABS 18.9*  --   --   --   --   HGB 10.3* 10.9* 9.2* 9.0* 7.8*  HCT 32.2* 32.0* 27.9* 28.3* 24.7*  MCV 84.3  --  81.8 84.2 85.8  PLT 334  --  346 326 268   BMP &GFR Recent Labs  Lab 04/21/24 0453 04/21/24 1005 04/21/24 1541 04/22/24 0316 04/22/24 1502 04/23/24 0314  NA 133* 133*  --  136 135 142  K 4.4 3.9  --  6.1* 3.9 4.8  CL 95* 100  --  101 97* 107  CO2 23  22  --  22 25 25   GLUCOSE 267* 115*  --  261* 194* 158*  BUN 13 11  --  7 9 6   CREATININE 0.84 0.76  --  0.79 0.62 0.53  CALCIUM  8.6* 8.3*  --  8.7* 8.1* 7.7*  MG  --   --  1.1* 2.0  --  1.4*  PHOS  --   --  2.7 3.6 2.8 3.0   Estimated Creatinine Clearance: 76.4 mL/min (by C-G formula based on SCr of 0.53 mg/dL). Liver & Pancreas: Recent Labs  Lab 04/20/24 1344 04/21/24 0453 04/22/24 0316 04/22/24 1502 04/23/24 0314  AST 15 14*  --   --   --   ALT 12 11  --   --   --   ALKPHOS 134* 121  --   --   --   BILITOT 2.0* 1.2  --   --   --   PROT 6.8 5.0*  --   --   --   ALBUMIN 2.5* 2.2* 2.2* 2.0* 1.8*   No results for input(s): LIPASE, AMYLASE in the last 168 hours. Recent Labs  Lab 04/21/24 1541  AMMONIA 13   Diabetic: Recent Labs    04/21/24 1005  HGBA1C 9.3*   Recent Labs  Lab 04/22/24 1418 04/22/24 1535 04/22/24 2149 04/23/24 0650 04/23/24 1210  GLUCAP 236* 174* 83 179* 156*   Cardiac Enzymes: No results for input(s): CKTOTAL, CKMB, CKMBINDEX, TROPONINI in the last 168 hours. No results for input(s): PROBNP in the last 8760 hours. Coagulation Profile: Recent Labs  Lab 04/20/24 1344  INR 1.2   Thyroid  Function Tests: Recent Labs    04/21/24 1541  TSH 1.692   Lipid Profile: Recent Labs    04/21/24 1005  CHOL 67  HDL 27*  LDLCALC 25  TRIG 73  CHOLHDL 2.5   Anemia Panel: Recent Labs    04/21/24 1541  VITAMINB12 764   Urine analysis:    Component Value Date/Time   COLORURINE YELLOW 04/20/2024 1851   APPEARANCEUR CLEAR 04/20/2024 1851   LABSPEC 1.036 (H) 04/20/2024 1851   PHURINE 6.0 04/20/2024 1851   GLUCOSEU 50 (A) 04/20/2024 1851   HGBUR SMALL (A) 04/20/2024 1851   BILIRUBINUR NEGATIVE 04/20/2024 1851   BILIRUBINUR neg 08/16/2016 1147   KETONESUR 80 (A) 04/20/2024 1851   PROTEINUR NEGATIVE 04/20/2024 1851   UROBILINOGEN 2.0 08/16/2016 1147   UROBILINOGEN 2.0 (H) 02/22/2015 2143   NITRITE NEGATIVE 04/20/2024 1851    LEUKOCYTESUR NEGATIVE 04/20/2024  1851   Sepsis Labs: Invalid input(s): PROCALCITONIN, LACTICIDVEN  Microbiology: Recent Results (from the past 240 hours)  Urine Culture (for pregnant, neutropenic or urologic patients or patients with an indwelling urinary catheter)     Status: Abnormal   Collection Time: 04/21/24  8:06 AM   Specimen: Urine, Clean Catch  Result Value Ref Range Status   Specimen Description URINE, CLEAN CATCH  Final   Special Requests NONE  Final   Culture (A)  Final    80,000 COLONIES/mL LACTOBACILLUS SPECIES Standardized susceptibility testing for this organism is not available. Performed at [facility], [address], [place], [place] [address]    Report Status 04/22/2024 FINAL  Final  Culture, blood (Routine X 2) w Reflex to ID Panel     Status: None (Preliminary result)   Collection Time: 04/21/24 10:05 AM   Specimen: BLOOD LEFT HAND  Result Value Ref Range Status   Specimen Description BLOOD LEFT HAND  Final   Special Requests   Final    BOTTLES DRAWN AEROBIC AND ANAEROBIC Blood Culture adequate volume   Culture   Final    NO GROWTH 2 DAYS Performed at [facility], [address] [place], [place] [address]    Report Status PENDING  Incomplete  Culture, blood (Routine X 2) w Reflex to ID Panel     Status: None (Preliminary result)   Collection Time: 04/21/24 10:05 AM   Specimen: BLOOD RIGHT HAND  Result Value Ref Range Status   Specimen Description BLOOD RIGHT HAND  Final   Special Requests   Final    BOTTLES DRAWN AEROBIC ONLY Blood Culture adequate volume   Culture   Final    NO GROWTH 2 DAYS Performed at [facility], [address], [place], [place] [address]    Report Status PENDING  Incomplete    Radiology Studies: No results found.     Hasset Chaviano T. Flordia Kassem [facility]  If 7PM-7AM, please contact night-coverage [email or web address] 04/23/2024, 1:05 PM

## 2024-04-23 NOTE — Evaluation (Signed)
 Speech Language Pathology Evaluation Patient Details Name: Brenda [name] MRN: [id number] DOB: 08/08/1961 Today's Date: 04/23/2024 Time: 8656-8643 SLP Time Calculation (min) (ACUTE ONLY): 13 min  Problem List:  Patient Active Problem List   Diagnosis Date Noted   Stroke-like symptoms 04/21/2024   Acute focal neurological deficit 04/20/2024   Cancer associated pain 04/16/2024   Goals of care, counseling/discussion 04/16/2024   Other constipation 04/16/2024   Intractable low back pain 03/29/2024   Small bowel obstruction (HCC) 12/09/2021   SBO (small bowel obstruction) (HCC) 12/08/2021   Endometrial adenocarcinoma (HCC) 12/06/2021   Nonspecific chest pain 10/19/2021   Dysfunctional uterine bleeding    Symptomatic anemia    Chronic respiratory failure (HCC) 03/22/2021   (HFpEF) heart failure with preserved ejection fraction (HCC) 02/09/2021   Old MI (myocardial infarction) 10/26/2019   Anxiety and depression 03/28/2016   Type 2 diabetes mellitus with complication, with long-term current use of insulin  (HCC) 02/23/2015   Morbid obesity (HCC) 02/22/2015   Dyspnea 03/11/2013   Hyperlipidemia associated with type 2 diabetes mellitus (HCC) 05/25/2009   CAD (coronary artery disease) 05/25/2009   Essential hypertension 05/24/2009   OSA treated with BiPAP 05/24/2009   Past Medical History:  Past Medical History:  Diagnosis Date   Anemia    Arthritis    back, hands, hips (02/22/2015)   CAD (coronary artery disease)    a. complex LAD/diagonal bifurcation PCI in 2010. b. STEMI 10/2019 s/p PTCA/DES x1 to mLAD overlapping the old stent, residual disease treated medicaly.   Cancer [facility])    uterine   CHF (congestive heart failure) (HCC)    Depression    Diabetes mellitus (HCC)    started when I was pregnant; not sure if it was type 1 or type 2    History of radiation therapy    Endometrium- HDR 01/22/22-03/07/22- Dr. Lynwood Nasuti   Hypercholesterolemia    Hypertension     Morbid obesity (HCC)    Myocardial infarction (HCC)    mild x 3   Neuromuscular disorder (HCC)    neuropathy feet   Sleep apnea    cpap/   Past Surgical History:  Past Surgical History:  Procedure Laterality Date   CARDIAC CATHETERIZATION  12/02/2012   CHOLECYSTECTOMY OPEN  09/04/1987   CORONARY ANGIOPLASTY WITH STENT PLACEMENT  09/03/2009   CORONARY/GRAFT ACUTE MI REVASCULARIZATION N/A 10/26/2019   Procedure: CORONARY/GRAFT ACUTE MI REVASCULARIZATION;  Surgeon: Verlin Lonni BIRCH, MD;  Location: [facility] INVASIVE CV LAB;  Service: Cardiovascular;  Laterality: N/A;   INCISIONAL HERNIA REPAIR N/A 12/09/2021   Procedure: LAPAROSCOPIC REPAIR UMBILICAL HERNIA WITH MESH;  Surgeon: Vernetta Berg, MD;  Location: [facility] ORS;  Service: General;  Laterality: N/A;   IR IMAGING GUIDED PORT INSERTION  04/08/2024   LEFT HEART CATH AND CORONARY ANGIOGRAPHY N/A 10/26/2019   Procedure: LEFT HEART CATH AND CORONARY ANGIOGRAPHY;  Surgeon: Verlin Lonni BIRCH, MD;  Location: [facility] INVASIVE CV LAB;  Service: Cardiovascular;  Laterality: N/A;   LEFT HEART CATHETERIZATION WITH CORONARY ANGIOGRAM N/A 12/25/2012   Procedure: LEFT HEART CATHETERIZATION WITH CORONARY ANGIOGRAM;  Surgeon: Ozell Fell, MD;  Location: [facility] CATH LAB;  Service: Cardiovascular;  Laterality: N/A;   RADIOLOGY WITH ANESTHESIA N/A 04/21/2024   Procedure: MRI WITH ANESTHESIA;  Surgeon: Radiologist, Medication, MD;  Location: [facility] OR;  Service: Radiology;  Laterality: N/A;  BRAIN W/ W/O   ROBOTIC ASSISTED TOTAL HYSTERECTOMY WITH BILATERAL SALPINGO OOPHERECTOMY N/A 12/06/2021   Procedure: XI ROBOTIC ASSISTED TOTAL HYSTERECTOMY WITH BILATERAL SALPINGO OOPHORECTOMY;  Surgeon:  [name] SAUNDERS, MD;  Location: [facility] ORS;  Service: Gynecology;  Laterality: N/A;   UMBILICAL HERNIA REPAIR  05/04/1989   doesn't think they used mesh   HPI:  63 y.o. female presents to [facility] hospital on 04/20/2024 with aphasia. Pt noted to be hyperglycemic and with concern for  DKA. MRI brain negative for acute processes. PMH includes HTN, lipidemia, DM, CAD, CHF, anxiety, depression, OSA, chronic respiratory failure, metastatic endometrial carcinoma.   Assessment / Plan / Recommendation Clinical Impression  [name] language difficulties have appeared to resolved. Yesterday this SLP assessed swallow and pt only able to say a few single words, unable to state her age or do simple addition. Today she is speaking in conversation and states she noticed mild word finding deficits when she was tired last night. There was no dysnomia during open ended responses or descriptions. Cognitive function appeared functional as she recalled 4 words independently after short delay, recalled appointment she had and ones upcoming. No difficulty with verbal problem solving and completed a mental math word solving problem with 50% accuracy. No furter ST is recommended. She reported she is using her phone without difficulty. SLP educated pt/husband re: word finding strategies if she has difficulty and did recommend pt have supervision the first time she handles finances or her medication.    SLP Assessment  SLP Recommendation/Assessment: Patient does not need any further Speech Language Pathology Services SLP Visit Diagnosis: Cognitive communication deficit (R41.841)     Assistance Recommended at Discharge  None  Functional Status Assessment Patient has not had a recent decline in their functional status  Frequency and Duration           SLP Evaluation Cognition  Overall Cognitive Status: Within Functional Limits for tasks assessed Arousal/Alertness: Awake/alert Attention: Sustained Sustained Attention: Appears intact Memory: Appears intact Awareness: Appears intact Problem Solving: Appears intact Safety/Judgment: Appears intact       Comprehension  Auditory Comprehension Overall Auditory Comprehension: Appears within functional limits for tasks assessed Visual  Recognition/Discrimination Discrimination: Not tested Reading Comprehension Reading Status: Not tested    Expression Expression Primary Mode of Expression: Verbal Verbal Expression Overall Verbal Expression: Appears within functional limits for tasks assessed Initiation: No impairment Level of Generative/Spontaneous Verbalization: Conversation Repetition:  (NT) Pragmatics: No impairment Written Expression Written Expression: Not tested   Oral / Motor  Oral Motor/Sensory Function Overall Oral Motor/Sensory Function: Within functional limits Motor Speech Overall Motor Speech: Appears within functional limits for tasks assessed Respiration: Within functional limits Phonation: Normal Resonance: Within functional limits Articulation: Within functional limitis Intelligibility: Intelligible Motor Planning: Within functional limits Motor Speech Errors: Not applicable            Dustin Olam Bull 04/23/2024, 2:16 PM

## 2024-04-24 ENCOUNTER — Other Ambulatory Visit (HOSPITAL_COMMUNITY): Payer: Self-pay

## 2024-04-24 ENCOUNTER — Other Ambulatory Visit

## 2024-04-24 ENCOUNTER — Other Ambulatory Visit: Admitting: Licensed Clinical Social Worker

## 2024-04-24 ENCOUNTER — Ambulatory Visit

## 2024-04-24 ENCOUNTER — Ambulatory Visit: Admitting: Hematology and Oncology

## 2024-04-24 DIAGNOSIS — I5032 Chronic diastolic (congestive) heart failure: Secondary | ICD-10-CM | POA: Diagnosis not present

## 2024-04-24 DIAGNOSIS — R2990 Unspecified symptoms and signs involving the nervous system: Secondary | ICD-10-CM | POA: Diagnosis not present

## 2024-04-24 DIAGNOSIS — R29818 Other symptoms and signs involving the nervous system: Secondary | ICD-10-CM | POA: Diagnosis not present

## 2024-04-24 DIAGNOSIS — E118 Type 2 diabetes mellitus with unspecified complications: Secondary | ICD-10-CM | POA: Diagnosis not present

## 2024-04-24 LAB — RENAL FUNCTION PANEL
Albumin: 1.8 g/dL — ABNORMAL LOW (ref 3.5–5.0)
Albumin: 2 g/dL — ABNORMAL LOW (ref 3.5–5.0)
Albumin: 2.2 g/dL — ABNORMAL LOW (ref 3.5–5.0)
Anion gap: 10 (ref 5–15)
Anion gap: 12 (ref 5–15)
Anion gap: 13 (ref 5–15)
BUN: 6 mg/dL (ref 6–20)
BUN: 6 mg/dL (ref 6–20)
BUN: 6 mg/dL (ref 6–20)
CO2: 25 mmol/L (ref 22–32)
CO2: 27 mmol/L (ref 22–32)
CO2: 25 mmol/L (ref 22–32)
Calcium: 7.7 mg/dL — ABNORMAL LOW (ref 8.9–10.3)
Calcium: 8.7 mg/dL — ABNORMAL LOW (ref 8.9–10.3)
Calcium: 8.5 mg/dL — ABNORMAL LOW (ref 8.9–10.3)
Chloride: 107 mmol/L (ref 98–111)
Chloride: 108 mmol/L (ref 98–111)
Chloride: 103 mmol/L (ref 98–111)
Creatinine, Ser: 0.53 mg/dL (ref 0.44–1.00)
Creatinine, Ser: 0.62 mg/dL (ref 0.44–1.00)
Creatinine, Ser: 0.6 mg/dL (ref 0.44–1.00)
GFR, Estimated: >60 mL/min (ref >60)
GFR, Estimated: >60 mL/min (ref >60)
GFR, Estimated: >60 mL/min (ref >60)
Glucose, Bld: 158 mg/dL — ABNORMAL HIGH (ref 70–99)
Glucose, Bld: 186 mg/dL — ABNORMAL HIGH (ref 70–99)
Glucose, Bld: 142 mg/dL — ABNORMAL HIGH (ref 70–99)
Phosphorus: 3 mg/dL (ref 2.5–4.6)
Phosphorus: 3 mg/dL (ref 2.5–4.6)
Phosphorus: 2.6 mg/dL (ref 2.5–4.6)
Potassium: 4.8 mmol/L (ref 3.5–5.1)
Potassium: 5 mmol/L (ref 3.5–5.1)
Potassium: 3.5 mmol/L (ref 3.5–5.1)
Sodium: 142 mmol/L (ref 135–145)
Sodium: 147 mmol/L — ABNORMAL HIGH (ref 135–145)
Sodium: 141 mmol/L (ref 135–145)

## 2024-04-24 LAB — CBC
HCT: 26.1 % — ABNORMAL LOW (ref 36.0–46.0)
Hemoglobin: 8.3 g/dL — ABNORMAL LOW (ref 12.0–15.0)
MCH: 26.7 pg (ref 26.0–34.0)
MCHC: 31.8 g/dL (ref 30.0–36.0)
MCV: 83.9 fL (ref 80.0–100.0)
Platelets: 282 K/uL (ref 150–400)
RBC: 3.11 MIL/uL — ABNORMAL LOW (ref 3.87–5.11)
RDW: 14.4 % (ref 11.5–15.5)
WBC: 13.7 K/uL — ABNORMAL HIGH (ref 4.0–10.5)
nRBC: 0 % (ref 0.0–0.2)

## 2024-04-24 LAB — CORTISOL: Cortisol, Plasma: 6.8 ug/dL

## 2024-04-24 LAB — MAGNESIUM: Magnesium: 1.8 mg/dL (ref 1.7–2.4)

## 2024-04-24 LAB — GLUCOSE, CAPILLARY
Glucose-Capillary: 175 mg/dL — ABNORMAL HIGH (ref 70–99)
Glucose-Capillary: 188 mg/dL — ABNORMAL HIGH (ref 70–99)

## 2024-04-24 MED ORDER — SENNOSIDES-DOCUSATE SODIUM 8.6-50 MG PO TABS: 1.0000 | ORAL_TABLET | Freq: Two times a day (BID) | ORAL | Status: DC | PRN

## 2024-04-24 MED ORDER — PREGABALIN 25 MG PO CAPS: 50.0000 mg | ORAL_CAPSULE | Freq: Two times a day (BID) | ORAL | Status: DC

## 2024-04-24 MED ORDER — SODIUM CHLORIDE 0.45 % IV BOLUS
500.0000 mL | Freq: Once | INTRAVENOUS | Status: AC
  Administered 2024-04-24: 500 mL via INTRAVENOUS

## 2024-04-24 MED ORDER — PREGABALIN 50 MG PO CAPS
50.0000 mg | ORAL_CAPSULE | Freq: Two times a day (BID) | ORAL | 0 refills | Status: DC
  Filled 2024-04-24: qty 60, 30d supply, fill #0

## 2024-04-24 MED ORDER — SODIUM CHLORIDE 0.45 % IV SOLN: INTRAVENOUS | Status: DC

## 2024-04-24 MED ORDER — CEFADROXIL 500 MG PO CAPS
1000.0000 mg | ORAL_CAPSULE | Freq: Two times a day (BID) | ORAL | 0 refills | Status: AC
  Filled 2024-04-24: qty 12, 3d supply, fill #0

## 2024-04-24 MED ORDER — OXYCODONE HCL 5 MG PO TABS
5.0000 mg | ORAL_TABLET | Freq: Four times a day (QID) | ORAL | 0 refills | Status: AC | PRN
  Filled 2024-04-24: qty 30, 8d supply, fill #0

## 2024-04-24 NOTE — Progress Notes (Signed)
 Occupational Therapy Treatment Patient Details Name: Brenda [name] MRN: [id number] DOB: 03/17/1961 Today's Date: 04/24/2024   History of present illness 63 y.o. female presents to [facility] hospital on 04/20/2024 with aphasia, code stroke .MRI brain negative for acute processes. Pt noted to be hyperglycemic with labs consistent with DKA. PMH includes HTN, lipidemia, DM, CAD, CHF, anxiety, depression, OSA, chronic respiratory failure, metastatic endometrial carcinoma.   OT comments  Pt declined OOB this session but agreeable to completion of medicog assessment.  Pt overall scored 7/10 (where 8/10 is considered adequate skills), pt demonstrating deficits in problem solving, attention, and multitasking.  She was unable to correct mistakes that were made.  Discussed recommendations to have assist with med mgmt, cooking, and driving- pt voiced understanding.  Will follow acutely with plan for next session to further assess ADLs.       If plan is discharge home, recommend the following:  A little help with walking and/or transfers;A lot of help with bathing/dressing/bathroom;Assistance with cooking/housework;Direct supervision/assist for medications management;Direct supervision/assist for financial management;Assist for transportation;Help with stairs or ramp for entrance;Supervision due to cognitive status   Equipment Recommendations  Tub/shower bench    Recommendations for Other Services      Precautions / Restrictions Precautions Precautions: Fall Recall of Precautions/Restrictions: Impaired Restrictions Weight Bearing Restrictions Per Provider Order: No       Mobility Bed Mobility                    Transfers                         Balance                                           ADL either performed or assessed with clinical judgement   ADL                                         General ADL Comments: pt declined OOB due  to not sleeping well overnight, focused on cog assessment bed level    Extremity/Trunk Assessment              Vision       Perception     Praxis     Communication Communication Communication: No apparent difficulties   Cognition Arousal: Alert Behavior During Therapy: Flat affect Cognition: Cognition impaired     Awareness: Online awareness impaired, Intellectual awareness intact Memory impairment (select all impairments): Working memory Attention impairment (select first level of impairment): Sustained attention Executive functioning impairment (select all impairments): Initiation, Organization, Sequencing, Reasoning, Problem solving OT - Cognition Comments: patient completed medi cog assessment-- during mini cog section, pt able to recall 3 words with increased time after delay but with clock drawing pt unable to set clock correctly for 10 minutes past 11 (placing hands around #10, with no difference in length of hands and not extending to the middle of the clock)-- overall the numbers were sequential but slightly off anchor positions.  When questioned after setting clock she voiced that is not right but was unable to fix it.  During medi cog portion, pt missed completing 1 of the medications listed #3 which resulted in a wrong answer to #5. Pt also reporting this  was harder than normal but not sure why.  Appeared to have decreased problem solving, attention, and multitasking ability.                  Following commands: Impaired Following commands impaired: Follows multi-step commands inconsistently      Cueing   Cueing Techniques: Verbal cues  Exercises      Shoulder Instructions       General Comments spouse at side and supportive, on 2L [place]    Pertinent Vitals/ Pain       Pain Assessment Pain Assessment: No/denies pain Pain Intervention(s): Monitored during session  Home Living                                          Prior  Functioning/Environment              Frequency  Min 2X/week        Progress Toward Goals  OT Goals(current goals can now be found in the care plan section)  Progress towards OT goals: Progressing toward goals  Acute Rehab OT Goals Patient Stated Goal: home tomorrow OT Goal Formulation: With patient Time For Goal Achievement: 05/06/24 Potential to Achieve Goals: Good  Plan      Co-evaluation                 AM-PAC OT 6 Clicks Daily Activity     Outcome Measure   Help from another person eating meals?: A Little Help from another person taking care of personal grooming?: A Little Help from another person toileting, which includes using toliet, bedpan, or urinal?: A Lot Help from another person bathing (including washing, rinsing, drying)?: A Lot Help from another person to put on and taking off regular upper body clothing?: A Lot Help from another person to put on and taking off regular lower body clothing?: A Lot 6 Click Score: 14    End of Session    OT Visit Diagnosis: Other abnormalities of gait and mobility (R26.89);Other symptoms and signs involving cognitive function;Cognitive communication deficit (R41.841)   Activity Tolerance Patient tolerated treatment well   Patient Left in bed;with call bell/phone within reach;with bed alarm set;with family/visitor present   Nurse Communication Mobility status        Time: 9043-8981 OT Time Calculation (min): 22 min  Charges: OT General Charges $OT Visit: 1 Visit OT Treatments $Self Care/Home Management : 8-22 mins  Etta NOVAK, OT Acute Rehabilitation Services Office [phone] Secure Chat Preferred    Etta GORMAN Hope 04/24/2024, 11:16 AM

## 2024-04-24 NOTE — Progress Notes (Signed)
 PT Cancellation Note  Patient Details Name: Brenda [name] MRN: [id number] DOB: 08/04/1961   Cancelled Treatment:    Reason Eval/Treat Not Completed: (P) Fatigue/lethargy limiting ability to participate (Pt reports poor sleep last night and requests to rest. Pt reuqests that PT returns tomorrow before discharge if able. Will continue to follow per PT POC.)   Darryle George 04/24/2024, 3:18 PM

## 2024-04-24 NOTE — TOC Transition Note (Addendum)
 Transition of Care [facility]) - Discharge Note   Patient Details  Name: Brenda [name] MRN: [id number] Date of Birth: [birth year]  Transition of [facility] CM/SW Contact:  Waddell Barnie Rama, RN Phone Number: 04/24/2024, 4:27 PM   Clinical Narrative:    For dc today, she states she can do outpatient pt/ot , NCM made referral to the neuro outpatient rehab center thru [facility].  Also she needs  a bsc, she is ok with [employer] supplying this for her and they will bring it to the house for patient.  Per [employer] the manage medicaid insurance does not cover the shower chair, but she should be able to use the bsc commode as a shower chair as well.     Barriers to Discharge: Continued Medical Work up   Patient Goals and CMS Choice            Discharge Placement                       Discharge Plan and Services Additional resources added to the After Visit Summary for     Discharge Planning Services: CM Consult                                 Social Drivers of Health (SDOH) Interventions SDOH Screenings   Food Insecurity: Food Insecurity Present (04/20/2024)  Housing: Unknown (04/20/2024)  Transportation Needs: Patient Unable To Answer (04/20/2024)  Utilities: Patient Unable To Answer (04/20/2024)  Depression (PHQ2-9): Low Risk  (09/25/2023)  Financial Resource Strain: High Risk (02/10/2021)  Tobacco Use: Medium Risk (04/21/2024)     Readmission Risk Interventions     No data to display

## 2024-04-24 NOTE — Discharge Summary (Signed)
 Physician Discharge Summary  Brenda [name] FMW:[id number] DOB: 1960/11/03 DOA: 04/20/2024  PCP: Lonn Hicks, MD  Admit date: 04/20/2024 Discharge date: 04/24/24  Admitted From: Home Disposition: Home Recommendations for Outpatient Follow-up:  Outpatient follow-up with PCP in 1 to 2 weeks Follow-up with oncology and cardiology as previously planned Assess blood pressure, CMP and CBC at follow-up Assess pain control at follow-up Please follow up on the following pending results: None  Home Health: [facility] PT/OT Equipment/Devices: Shower seat and bedside commode  Discharge Condition: Stable CODE STATUS: Full code Diet Orders (From admission, onward)     Start     Ordered   04/24/24 0000  Diet - low sodium heart healthy        04/24/24 1518   04/24/24 0000  Diet Carb Modified        04/24/24 1518   04/21/24 1533  Diet heart healthy/carb modified Room service appropriate? Yes; Fluid consistency: Thin  Diet effective now       Question Answer Comment  Diet-HS Snack? Nothing   Room service appropriate? Yes   Fluid consistency: Thin      04/21/24 1532             Follow-up Information     [facility] Follow up.   Why: [facility] Contact information: [phone]        [facility] Follow [facility].   Specialty: Rehabilitation Why: They will contact you, if you do not hear from them in 3 business days please give them a call. Contact information: [address] [name]  [address] [phone]        Lonn Hicks, MD. Call in 1 week(s).   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   In ED, stable vitals.  Labs consistent with DKA.  WBC 20.8 with left shift.  UA with bacteria.  Evaluated by neurology.  CT head and CT angio head and neck without acute or significant finding.  MRI brain ordered, and admitted for CVA workup.  Also started on insulin  drip for DKA.   MRI brain and TTE without significant finding.  LDL 25.  A1c 9.3%.  Neurology recommended continuing home Effient  and signed off.   Encephalopathy improved after treating DKA, adjusting home pain medications, starting antibiotics for UTI and IV fluid hydration. Urine culture grew lactobacillus species.  On the day of discharge, encephalopathy resolved.  She felt well and ready to go home.  She is discharged on p.o. cefadroxil  for 2 more days to complete treatment course.   After risk and benefit discussion, we have discontinued Robaxin , Flexeril and decreased oxycodone  and Lyrica .  Home health and DME ordered as recommended by therapy.  See individual problem list below for more.   Problems addressed during this hospitalization Acute metabolic encephalopathy/paraphasia: Etiology not clear but possibilities are polypharmacy (on multiple sedating meds), DKA, delirium and infection (leukocytosis) CT head, CTA head and neck and MRI brain with and without contrast unrevealing.  VBG, TSH, B12, ammonia and  RPR unrevealing.  UDS positive for opiates.  Neuroexam is nonfocal but disoriented.  Culture data as above.  Encephalopathy and paraphasia resolved.  Awake and alert and oriented x 4.  No focal neurodeficit. -Adjusted pain medication. - DKA resolved. -Antibiotics for UTI - HH PT/OT   IDDM-2 with diabetic ketoacidosis: A1c 9.2% on 7/27. -Continue home meds.   UTI/leukocytosis/bandemia: UA with bacteria.  Urine culture with lactobacillus species.  No respiratory or GI symptoms.  CXR without acute finding.  Blood cultures NGTD.   Improved after starting IV ceftriaxone  -Received ceftriaxone  for 3 days and discharged on p.o. cefadroxil  for 2 more days.   Stage IV endometrial cancer with mets to lung: diagnosed in 2023.  S/p hysterectomy and adjuvant radiation.  Recurrence/Admission last month with severe back pain and imaging showing retroperitoneal lymph node disease and suspicious lung mass.  Biopsy of mass confirm adenocarcinoma.Follows with oncology, stage IV disease, port has been placed has been educated on chemotherapy with plan to start treatment soon.  MRI brain without metastasis.  Primary oncologist aware of patient's admission and will reschedule outpatient follow-up.   Cancer related pain: On MS Contin , oxycodone , Flexeril, Robaxin , Lyrica  at home.  Pain control with home MS Contin  and reduced dose of oxycodone  and Lyrica  in house. -Continue MS Contin  -Decreased oxycodone  and Lyrica  -Discontinued Flexeril and Robaxin  -Further adjustment as appropriate.   History of CAD s/p PTCA/DES to mid LAD in 10/2019.  No cardiopulmonary symptoms. -Continue home Lipitor , Effient  -Per patient, her cardiologist changed Entresto  to losartan  due to low BP   Chronic diastolic CHF: Last TTE with LVEF of 60 to 65% and G2 DD and mildly reduced RV SF.  Appears euvolemic on exam.  No cardiopulmonary symptoms. -Advised to hold Lasix  for 4 more days -Continue home losartan    Hypotension/essential hypertension: Soft BP.  She tells me that her cardiologist changed Entresto  to losartan  due to hypotension.  Recent TTE with LVEF of 60 to 65%, G1 DD.  TSH normal.  Initial cortisol low but repeat normal.  Hypotension resolved. - Cardiac meds as above   Hyperlipidemia - Continue home atorvastatin    Anxiety and depression - Continue home bupropion  and fluoxetine  as able   OSA/chronic hypoxic RF: On CPAP and 2 L at baseline.  Stable. - Continue nightly CPAP   Hyponatremia/hyponatremia: Resolved.   Hypokalemia/hypomagnesemia:  Resolved.   Anemia of chronic disease: Stable - Recheck CBC at follow-up   Physical deconditioning: Lives alone.  Uses rollator at baseline. - Home health and DME ordered as recommended by therapy.  Acute urinary retention: Resolved.  Passed voiding trial.   Lactic acidosis: Likely due to DKA.  Resolved.   Hyperkalemia: Resolved.   Class I obesity Body mass index is 34.92 kg/m.           Consultations: Neurology  Time spent 35  minutes  Vital signs Vitals:   04/24/24 0334 04/24/24 0733 04/24/24 1159 04/24/24 1535  BP: (!) 124/50 (!) 110/51 (!) 140/64 129/74  Pulse: 76 88 85 96  Temp: 98.4 F (36.9 C) 98.7 F (37.1 C) 97.7 F (36.5 C) 97.7 F (36.5 C)  Resp: 19 19 19 17   Height:      Weight:      SpO2: 99% 99% 100% 99%  TempSrc: Oral   Oral  BMI (Calculated):         Discharge exam  GENERAL: No apparent distress.  Nontoxic. HEENT: MMM.  Vision and hearing grossly intact.  NECK: Supple.  No apparent JVD.  RESP:  No IWOB.  Fair aeration bilaterally. CVS:  RRR. Heart sounds normal.  ABD/GI/GU: BS+. Abd soft, NTND.  MSK/EXT:  Moves extremities. No apparent deformity. No edema.  SKIN: no apparent skin lesion or wound NEURO: Awake and alert. Oriented x 4.  No apparent focal neuro deficit. PSYCH: Calm. Normal affect.   Discharge Instructions Discharge Instructions     Diet - low sodium heart healthy   Complete by: As directed    Diet Carb Modified   Complete by: As directed    Discharge instructions   Complete by: As directed    It has been a pleasure taking care of you!  You were hospitalized due to altered mental status.  It is not clear what caused it but possibilities are pain medications and/or UTI.  Your MRI did not show stroke.  Your symptoms improved after adjusting your pain medications and treating you for possible UTI.  We are discharging you on more antibiotics to complete treatment course for UTI.  We made changes to your pain medication  during this hospitalization.  Please review your new medication list and the directions on your medications before you take them.  Follow-up with your primary care doctor in 1 to 2 weeks or sooner if needed.  Follow-up with your oncologist and cardiologist as previously planned.  Maintain good hydration.  Note that combination of morphine , oxycodone , pregabalin  cyclobenzaprine, methocarbamol  and prochlorperazine  can increase your risk of drowsiness, sedation, constipation, impaired judgment, respiratory depression that could potentially lead to death and impaired balance that could increase risk of fall.  We have stopped some of these medications and/are reduced the dosage.  Please review your new medication list and the directions on your medications before you take them.  We do not recommend driving, operating machinery or other activity that requires similar mental and physical engagement.     Take care,   Increase activity slowly   Complete by: As directed       Allergies as of 04/24/2024       Reactions   Hydrocodone Shortness Of Breath, Dermatitis, Other (See Comments)   I forget to breathe.   Clopidogrel Rash, Dermatitis        Medication List     PAUSE taking these medications    furosemide  40 MG tablet Wait to take this until: April 28, 2024 Commonly known as: LASIX  Take 1 tablet by mouth once daily       STOP taking these medications    ciprofloxacin  500 MG tablet Commonly known as: CIPRO    cyclobenzaprine 10 MG tablet Commonly known as: FLEXERIL   cyproheptadine 4 MG tablet Commonly known as: PERIACTIN   Entresto  24-26 MG Generic drug: sacubitril -valsartan    methocarbamol  500 MG tablet Commonly known as: ROBAXIN    ofloxacin 0.3 % ophthalmic solution Commonly known as: OCUFLOX       TAKE these medications    acetaminophen  500 MG tablet Commonly known as: TYLENOL  Take 1-2 tablets (500-1,000 mg total) by mouth every 8 (eight) hours as needed for  mild pain (pain score 1-3) or moderate pain (pain score 4-6).   atorvastatin  80 MG tablet Commonly known as: LIPITOR  Take 1 tablet (80 mg total) by mouth every evening.   buPROPion  150 MG 24 hr tablet Commonly known as: WELLBUTRIN  XL Take 150 mg by mouth in the morning.   CareTouch 2 CPAP Hose [employer] Misc daily at night   cefadroxil  500 MG capsule Commonly known as: DURICEF Take 2 capsules (1,000 mg total)  by mouth 2 (two) times daily for 3 days. Start taking on: April 25, 2024   Fiasp  FlexTouch 100 UNIT/ML FlexTouch Pen Generic drug: insulin  aspart Inject 5-8 Units into the skin See admin instructions. Inject 5 units into the skin before breakfast & lunch and 8 units before supper/evening meal   FLUoxetine  40 MG capsule Commonly known as: PROZAC  Take 40 mg by mouth daily.   FreeStyle Libre 3 Sensor Misc Inject 1 Device into the skin every 14 (fourteen) days.   insulin  degludec 100 UNIT/ML FlexTouch Pen Commonly known as: TRESIBA Inject 20 Units into the skin every evening.   lidocaine -prilocaine  cream Commonly known as: EMLA  Apply to affected area once   losartan  25 MG tablet Commonly known as: COZAAR  Take 1 tablet (25 mg total) by mouth daily.   metFORMIN 500 MG 24 hr tablet Commonly known as: GLUCOPHAGE-XR Take 1,000 mg by mouth in the morning and at bedtime.   Misc. Devices Misc Portable oxygen  concentrator, 2L oxygen .  Diagnoses-chronic respiratory failure with hypoxia   morphine  15 MG 12 hr tablet Commonly known as: MS CONTIN  Take 1 tablet (15 mg total) by mouth at bedtime.   NEURIVA PO Take 1 tablet by mouth daily.   nitroGLYCERIN  0.4 MG SL tablet Commonly known as: NITROSTAT  Dissolve 1 tablet under the tongue every 5 minutes as needed for chest pain. Max of 3 doses, then 911.   ondansetron  8 MG tablet Commonly known as: ZOFRAN  Take 1 tablet (8 mg total) by mouth every 8 (eight) hours as needed for nausea or vomiting. Start on the third day after  carboplatin.   oxyCODONE  5 MG immediate release tablet Commonly known as: Oxy IR/ROXICODONE  Take 1 tablet (5 mg total) by mouth every 6 (six) hours as needed for up to 8 days for moderate pain (pain score 4-6). What changed:  medication strength how much to take when to take this reasons to take this   OXYGEN  Inhale 2 L/min into the lungs continuous.   polyethylene glycol powder 17 GM/SCOOP powder Commonly known as: GLYCOLAX /MIRALAX  Take 17 g by mouth daily as needed for mild constipation.   prasugrel  5 MG Tabs tablet Commonly known as: Effient  Take 1 tablet (5 mg total) by mouth in the morning. AFTER TAKING YOUR DOSE ON 04/07/2024, STOP TAKING IT IN PREPARATION FOR THE BIOPSY ON 04/15/2024. AFTER THE BIOPSY, FOLLOW THE RADIOLOGIST RECOMMENDATIONS AS TO TIMING OF RESTARTING THIS MEDICINE.   pregabalin  50 MG capsule Commonly known as: LYRICA  Take 1 capsule (50 mg total) by mouth 2 (two) times daily. What changed:  medication strength how much to take when to take this   prochlorperazine  10 MG tablet Commonly known as: COMPAZINE  Take 1 tablet (10 mg total) by mouth every 6 (six) hours as needed for nausea or vomiting.   senna-docusate 8.6-50 MG tablet Commonly known as: Senokot-S Take 1-2 tablets by mouth 2 (two) times daily between meals as needed for mild constipation or moderate constipation.   Vitamin D3 Super Strength 50 MCG (2000 UT) Caps Generic drug: Cholecalciferol  Take 2,000 Units by mouth in the morning.               Durable Medical Equipment  (From admission, onward)           Start     Ordered   04/24/24 1626  For home use only DME Bedside commode  Once       Question:  Patient needs a bedside commode to treat with the following condition  Answer:  Weakness   04/24/24 1625   04/24/24 0755  For home use only DME Shower stool  Once        04/24/24 0754             Procedures/Studies:   ECHOCARDIOGRAM COMPLETE Result Date: 04/21/2024     ECHOCARDIOGRAM REPORT   Patient Name:   Brenda [name] Date of Exam: 04/21/2024 Medical Rec #:  [id number]           Height:       62.0 in Accession #:    [id number]          Weight:       190.9 lb Date of Birth:  03/26/1961           BSA:          1.874 m Patient Age:    62 years            BP:           119/59 mmHg Patient Gender: F                   HR:           95 bpm. Exam Location:  Inpatient Procedure: 2D Echo, Cardiac Doppler and Color Doppler (Both Spectral and Color            Flow Doppler were utilized during procedure).                                 MODIFIED REPORT:  This report was modified by Jerel Balding MD on 04/21/2024 due to Added saline                                 contrast report.  Indications:     Stroke  History:         Patient has prior history of Echocardiogram examinations, most                  recent 08/23/2023. CAD; Risk Factors:Hypertension, Dyslipidemia                  and Diabetes.  Sonographer:     Philomena Daring Referring Phys:  [id number] Diagnosing Phys: Jerel Balding MD IMPRESSIONS  1. Left ventricular ejection fraction, by estimation, is 60 to 65%. The left ventricle has normal function. The left ventricle has no regional wall motion abnormalities. Left ventricular diastolic parameters are consistent with Grade I diastolic dysfunction (impaired relaxation).  2. Right ventricular systolic function is mildly reduced. The right ventricular size is normal. Tricuspid regurgitation signal is inadequate for assessing PA pressure.  3. Left atrial size was mildly dilated.  4. The mitral valve is degenerative. No evidence of mitral valve regurgitation. No evidence of mitral stenosis. Moderate mitral annular calcification.  5. The aortic valve is tricuspid. There is mild calcification of the aortic valve. There is moderate thickening of the aortic valve. Aortic valve regurgitation is mild. Mild aortic valve stenosis.  6. The inferior vena cava is normal in size with  greater than 50% respiratory variability, suggesting right atrial pressure of 3 mmHg.  7. Agitated saline contrast bubble study was negative, with no evidence of any interatrial shunt. Comparison(s): Prior images reviewed side by side. There is now mild aortic stenosis. FINDINGS  Left Ventricle: Left ventricular ejection fraction, by  estimation, is 60 to 65%. The left ventricle has normal function. The left ventricle has no regional wall motion abnormalities. The left ventricular internal cavity size was normal in size. There is  no left ventricular hypertrophy. Left ventricular diastolic parameters are consistent with Grade I diastolic dysfunction (impaired relaxation). Right Ventricle: The right ventricular size is normal. No increase in right ventricular wall thickness. Right ventricular systolic function is mildly reduced. Tricuspid regurgitation signal is inadequate for assessing PA pressure. Left Atrium: Left atrial size was mildly dilated. Right Atrium: Right atrial size was not well visualized. Pericardium: There is no evidence of pericardial effusion. Mitral Valve: The mitral valve is degenerative in appearance. Moderate mitral annular calcification. No evidence of mitral valve regurgitation. No evidence of mitral valve stenosis. Tricuspid Valve: The tricuspid valve is normal in structure. Tricuspid valve regurgitation is not demonstrated. Aortic Valve: The aortic valve is tricuspid. There is mild calcification of the aortic valve. There is moderate thickening of the aortic valve. Aortic valve regurgitation is mild. Mild aortic stenosis is present. Aortic valve mean gradient measures 13.7 mmHg. Aortic valve peak gradient measures 26.6 mmHg. Aortic valve area, by VTI measures 1.52 cm. Pulmonic Valve: The pulmonic valve was not well visualized. Pulmonic valve regurgitation is not visualized. No evidence of pulmonic stenosis. Aorta: The aortic root and ascending aorta are structurally normal, with no evidence  of dilitation. Venous: The inferior vena cava is normal in size with greater than 50% respiratory variability, suggesting right atrial pressure of 3 mmHg. IAS/Shunts: No atrial level shunt detected by color flow Doppler. Agitated saline contrast was given intravenously to evaluate for intracardiac shunting. Agitated saline contrast bubble study was negative, with no evidence of any interatrial shunt.  LEFT VENTRICLE PLAX 2D LVIDd:         4.32 cm   Diastology LVIDs:         2.95 cm   LV e' medial:    6.31 cm/s LV PW:         1.32 cm   LV E/e' medial:  20.3 LV IVS:        1.08 cm   LV e' lateral:   7.62 cm/s LVOT diam:     2.08 cm   LV E/e' lateral: 16.8 LV SV:         68 LV SV Index:   36 LVOT Area:     3.40 cm  RIGHT VENTRICLE            IVC RV S prime:     8.92 cm/s  IVC diam: 1.22 cm TAPSE (M-mode): 1.8 cm LEFT ATRIUM             Index        RIGHT ATRIUM           Index LA diam:        3.28 cm 1.75 cm/m   RA Area:     12.50 cm LA Vol (A2C):   57.3 ml 30.57 ml/m  RA Volume:   26.10 ml  13.93 ml/m LA Vol (A4C):   39.3 ml 20.97 ml/m LA Biplane Vol: 48.8 ml 26.04 ml/m  AORTIC VALVE AV Area (Vmax):    1.41 cm AV Area (Vmean):   1.43 cm AV Area (VTI):     1.52 cm AV Vmax:           258.00 cm/s AV Vmean:          169.333 cm/s AV VTI:  0.444 m AV Peak Grad:      26.6 mmHg AV Mean Grad:      13.7 mmHg LVOT Vmax:         107.00 cm/s LVOT Vmean:        71.500 cm/s LVOT VTI:          0.199 m LVOT/AV VTI ratio: 0.45  AORTA Ao Root diam: 2.58 cm Ao Asc diam:  3.32 cm MITRAL VALVE MV Area (PHT): 4.21 cm     SHUNTS MV Decel Time: 180 msec     Systemic VTI:  0.20 m MV E velocity: 128.00 cm/s  Systemic Diam: 2.08 cm MV A velocity: 169.00 cm/s MV E/A ratio:  0.76 Mihai Croitoru MD Electronically signed by Jerel Balding MD Signature Date/Time: 04/21/2024/3:56:00 PM    Final (Updated)    MR Brain W and Wo Contrast Result Date: 04/21/2024 EXAM: MRI BRAIN WITH AND WITHOUT CONTRAST 04/21/2024 12:43:21 PM  TECHNIQUE: Multiplanar multisequence MRI of the head/brain was performed with and without the administration of intravenous contrast. COMPARISON: CT of the head dated 04/20/2024. CLINICAL HISTORY: Aphasia. 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 FINDINGS: BRAIN AND VENTRICLES: No acute infarct. No acute intracranial hemorrhage. No mass effect or midline shift. No hydrocephalus. The sella is unremarkable. Normal flow voids. No mass or abnormal enhancement. ORBITS: No acute abnormality. SINUSES: No acute abnormality. BONES AND SOFT TISSUES: Normal bone marrow signal and enhancement. No acute soft tissue abnormality. IMPRESSION: 1. No acute intracranial abnormality. 2. No mass or abnormal enhancement. Electronically signed by: Evalene Coho MD 04/21/2024 12:49 PM EDT RP Workstation: [id number]   DG CHEST PORT 1 VIEW Result Date: 04/20/2024 CLINICAL DATA:  Leukocytosis EXAM: PORTABLE CHEST 1 VIEW COMPARISON:  04/19/2023 and CT chest from 04/03/2024 FINDINGS: Low lung volumes are present, causing crowding of the pulmonary vasculature. Scarring or atelectasis at both lung bases, right greater than left, with suspected elevated right hemidiaphragm. Right IJ Port-A-Cath tip: Right atrium. Coronary atherosclerosis with coronary stents. Probable mitral valve calcification. Moderate degenerative glenohumeral arthropathy bilaterally. IMPRESSION: 1. Low lung volumes with bibasilar scarring or atelectasis, right greater than left. 2. Coronary atherosclerosis with coronary stents. 3. Probable mitral valve calcification. 4. Moderate degenerative glenohumeral arthropathy bilaterally. Electronically Signed   By: Ryan Salvage M.D.   On:  04/20/2024 16:49   CT ANGIO HEAD NECK W WO CM W PERF (CODE STROKE) Result Date: 04/20/2024 CLINICAL DATA:  Provided history: Neuro deficit, acute, stroke suspected. EXAM: CT ANGIOGRAPHY HEAD AND NECK CT PERFUSION BRAIN TECHNIQUE: Multidetector CT imaging of the head and neck was performed using the standard protocol during bolus administration of intravenous contrast. Multiplanar CT image reconstructions and MIPs were obtained to evaluate the vascular anatomy. Carotid stenosis measurements (when applicable) are obtained utilizing NASCET criteria, using the distal internal carotid diameter as the denominator. Multiphase CT imaging of the brain was performed following IV bolus contrast injection. Subsequent parametric perfusion maps were calculated using RAPID software. RADIATION DOSE REDUCTION: This exam was performed according to the departmental dose-optimization program which includes automated exposure control, adjustment of the mA and/or kV according to patient size and/or use of iterative reconstruction technique. CONTRAST:  OMNIPAQUE  IOHEXOL  350 MG/ML SOLN COMPARISON:  Noncontrast head CT performed earlier today 04/20/2024. CT angiogram head 05/31/2016. Chest CT 04/03/2024. FINDINGS: CTA NECK FINDINGS Aortic arch: Standard aortic branching. Atherosclerotic plaque within the aortic arch and proximal major branch vessels of the neck. Streak/beam hardening artifact arising from a dense contrast bolus partially obscures the right subclavian artery. Within this limitation, there is no appreciable hemodynamically significant innominate or proximal subclavian artery stenosis. Right carotid system: CCA and ICA patent within the neck without stenosis. Mild calcified atherosclerotic plaque about the carotid bifurcation. Left carotid system: CCA and ICA patent within the neck without stenosis. Mild calcified plaque within the proximal ICA. Vertebral arteries: Patent within the neck without stenosis or  significant atherosclerotic disease. The right vertebral artery is dominant. Skeleton: The neck bilaterally. No acute fracture or aggressive osseous lesion. Other neck: Subcentimeter right thyroid  lobe nodule not meeting consensus criteria for ultrasound follow-up based on size. No follow-up imaging recommended. Reference: J Am Coll Radiol. 2015 Feb;12(2): 143-50. Mild nonspecific edema within Upper chest: Only faint residual ground-glass opacity remains at site of the 12 mm right upper lobe pulmonary nodule described on the recent prior chest CT of 04/03/2024 (series 5, image 341). A 6 mm part solid nodule more medially within the right upper lobe is unchanged (series 5, image 335). Right chest infusion port catheter. Review of the MIP images confirms the above findings CTA HEAD FINDINGS Anterior circulation: The intracranial internal carotid arteries are patent. Non-stenotic atherosclerotic plaque within both vessels. The M1 middle cerebral arteries are patent. No M2 proximal branch occlusion or high-grade proximal stenosis. The anterior cerebral arteries are patent. No intracranial aneurysm is identified. Posterior circulation: The intracranial vertebral arteries are patent. The basilar artery is patent. The posterior cerebral arteries are patent. Posterior communicating arteries are diminutive or absent, bilaterally. Venous sinuses: Within the limitations of contrast timing, no convincing thrombus. Anatomic variants: As described. Review of the MIP images confirms the above findings CT Brain Perfusion Findings: Poor source data/bolus quality limits reliability of the perfusion estimates. Within this limitation, findings are as follows. CBF (<30%) Volume: 0mL Perfusion (Tmax>6.0s) volume: 8 mL (right temporal lobe). Mismatch Volume: 8mL Infarction Location:None identified. No emergent large vessel occlusion identified. These results were communicated to Dr. Michaela at 2:26 pmon 8/18/2025by text page via the  [facility] messaging system. IMPRESSION: CTA neck: 1. The common carotid and internal carotid arteries are patent within the neck without stenosis. Mild atherosclerotic plaque bilaterally, as described. 2. The vertebral arteries are patent within the neck without stenosis or significant atherosclerotic disease. 3. Aortic Atherosclerosis (ICD10-I70.0). 4. Only faint residual ground-glass opacity remains at site of the 12 mm right upper lobe pulmonary nodule described on the recent prior chest CT of 04/03/2024. 5. A 6 mm part-solid nodule more medially within the right upper lobe is unchanged. CTA head: 1. No proximal intracranial large vessel occlusion or high-grade proximal arterial stenosis is identified. 2. Non-stenotic atherosclerotic plaque within the intracranial ICAs. CT perfusion head: Poor source data/bolus quality limits reliability of the perfusion estimates. Within this limitation, findings are as follows. The perfusion software identifies no core infarct. The perfusion software identifies an 8 mL region of hypoperfusion in the right temporal lobe (utilizing the Tmax>6 seconds threshold). Reported mismatch volume: 8 mL. Electronically Signed   By: Rockey Childs D.O.   On: 04/20/2024 14:27   CT HEAD CODE STROKE WO CONTRAST Result Date: 04/20/2024 CLINICAL DATA:  Provided history: Code stroke. Neuro deficit, acute, stroke suspected. EXAM: CT HEAD WITHOUT CONTRAST TECHNIQUE: Contiguous axial  images were obtained from the base of the skull through the vertex without intravenous contrast. RADIATION DOSE REDUCTION: This exam was performed according to the departmental dose-optimization program which includes automated exposure control, adjustment of the mA and/or kV according to patient size and/or use of iterative reconstruction technique. COMPARISON:  CT angiogram head 05/31/2016. FINDINGS: Brain: Mild generalized cerebral atrophy. There is no acute intracranial hemorrhage. No demarcated cortical infarct. No  extra-axial fluid collection. No evidence of an intracranial mass. No midline shift. Vascular: No hyperdense vessel.  Atherosclerotic calcifications. Skull: No calvarial fracture or aggressive osseous lesion. Sinuses/Orbits: No mass or acute finding within the imaged orbits. Opacification of a single right ethmoid air cell. ASPECTS [facility] Stroke Program Early CT Score) - Ganglionic level infarction (caudate, lentiform nuclei, internal capsule, insula, M1-M3 cortex): 7 - Supraganglionic infarction (M4-M6 cortex): 3 Total score (0-10 with 10 being normal): 10 No evidence of an acute intracranial abnormality. These results were communicated to Dr. Michaela at 1:58 pmon 8/18/2025by text page via the [facility] messaging system. IMPRESSION: 1.  No evidence of an acute intracranial abnormality. 2. Mild generalized cerebral atrophy. 3. Mild right ethmoid sinusitis. Electronically Signed   By: Rockey Childs D.O.   On: 04/20/2024 14:00   IR IMAGING GUIDED PORT INSERTION Result Date: 04/08/2024 CLINICAL DATA:  Recurrent metastatic uterine carcinoma EXAM: RIGHT INTERNAL JUGULAR SINGLE LUMEN POWER PORT CATHETER INSERTION Date:  04/08/2024 04/08/2024 1:24 pm Radiologist:  M. Frederic Specking, MD Guidance:  Ultrasound and fluoroscopic MEDICATIONS: 1% lidocaine  local with epinephrine  ANESTHESIA/SEDATION: Versed  1.0 mg IV; Fentanyl  50 mcg IV; Moderate Sedation Time:  26 minutes The patient was continuously monitored during the procedure by the interventional radiology nurse under my direct supervision. FLUOROSCOPY: 0 minutes, 48 seconds (6 mGy) COMPLICATIONS: None immediate. CONTRAST:  None. PROCEDURE: Informed consent was obtained from the patient following explanation of the procedure, risks, benefits and alternatives. The patient understands, agrees and consents for the procedure. All questions were addressed. A time out was performed. Maximal barrier sterile technique utilized including caps, mask, sterile gowns, sterile gloves, large  sterile drape, hand hygiene, and 2% chlorhexidine  scrub. Under sterile conditions and local anesthesia, right internal jugular micropuncture venous access was performed. Access was performed with ultrasound. Images were obtained for documentation of the patent right internal jugular vein. A guide wire was inserted followed by a transitional dilator. This allowed insertion of a guide wire and catheter into the IVC. Measurements were obtained from the SVC / RA junction back to the right IJ venotomy site. In the right infraclavicular chest, a subcutaneous pocket was created over the second anterior rib. This was done under sterile conditions and local anesthesia. 1% lidocaine  with epinephrine  was utilized for this. A 2.5 cm incision was made in the skin. Blunt dissection was performed to create a subcutaneous pocket over the right pectoralis major muscle. The pocket was flushed with saline vigorously. There was adequate hemostasis. The port catheter was assembled and checked for leakage. The port catheter was secured in the pocket with two retention sutures. The tubing was tunneled subcutaneously to the right venotomy site and inserted into the SVC/RA junction through a valved peel-away sheath. Position was confirmed with fluoroscopy. Images were obtained for documentation. The patient tolerated the procedure well. No immediate complications. Incisions were closed in a two layer fashion with 4 - 0 Vicryl suture. Dermabond was applied to the skin. The port catheter was accessed, blood was aspirated followed by saline and heparin  flushes. Needle was removed. A dry sterile  dressing was applied. IMPRESSION: Ultrasound and fluoroscopically guided right internal jugular single lumen power port catheter insertion. Tip in the SVC/RA junction. Catheter ready for use. Electronically Signed   By: CHRISTELLA.  Shick M.D.   On: 04/08/2024 14:26   CT ABDOMINAL MASS BIOPSY Result Date: 04/08/2024 INDICATION: LEFT RETROPERITONEAL METASTATIC  NODAL MASS EXAM: CT-GUIDED BIOPSY LEFT RETROPERITONEAL NODAL MASS MEDICATIONS: 1% LIDOCAINE  LOCAL ANESTHESIA/SEDATION: 1.0 mg IV Versed ; 50 mcg IV Fentanyl  Moderate Sedation Time:  12 MINUTE The patient was continuously monitored during the procedure by the interventional radiology nurse under my direct supervision. PROCEDURE: The procedure, risks, benefits, and alternatives were explained to the patient. Questions regarding the procedure were encouraged and answered. The patient understands and consents to the procedure. Previous imaging reviewed. Patient positioned prone. Noncontrast localization CT performed. The left retroperitoneal large nodal mass with central necrosis was localized and marked. Under sterile conditions and local anesthesia, the 17 gauge guide needle was advanced to the peripheral margin of the lesion laterally. Needle position confirmed with CT. 18 gauge core biopsies obtained. These were placed in formalin. Needle removed. Images obtained for documentation. Patient tolerated the procedure well without complication. Vital sign monitoring by nursing staff during the procedure will continue as patient is in the special procedures unit for post procedure observation. FINDINGS: The images document guide needle placement within the large left retroperitoneal nodal mass. Post biopsy images demonstrate no hemorrhage or hematoma. COMPLICATIONS: None immediate. IMPRESSION: Successful CT-guided core biopsy of the left retroperitoneal nodal mass RADIATION DOSE REDUCTION: This exam was performed according to the departmental dose-optimization program which includes automated exposure control, adjustment of the mA and/or kV according to patient size and/or use of iterative reconstruction technique. Electronically Signed   By: CHRISTELLA.  Shick M.D.   On: 04/08/2024 14:25   CT Chest W Contrast Result Date: 04/03/2024 CLINICAL DATA:  Genital cancer, staging Per Dr. Eldonna, new large retroperitoneal mass with  lymphadenopathy concerning for recurrence, hx of endometrial cancer s/p surgery and radiation. * Tracking Code: BO * EXAM: CT CHEST WITH CONTRAST TECHNIQUE: Multidetector CT imaging of the chest was performed during intravenous contrast administration. RADIATION DOSE REDUCTION: This exam was performed according to the departmental dose-optimization program which includes automated exposure control, adjustment of the mA and/or kV according to patient size and/or use of iterative reconstruction technique. CONTRAST:  75mL OMNIPAQUE  IOHEXOL  300 MG/ML  SOLN COMPARISON:  CT Angiography chest from 10/26/2019. FINDINGS: Cardiovascular: Normal cardiac size. No pericardial effusion. No aortic aneurysm. There are coronary artery calcifications, in keeping with coronary artery disease. There are also mild peripheral atherosclerotic vascular calcifications of thoracic aorta and its major branches. Mitral annulus calcifications noted. Mediastinum/Nodes: Visualized thyroid  gland appears grossly unremarkable. No solid / cystic mediastinal masses. The esophagus is nondistended precluding optimal assessment. There is mild circumferential thickening of the lower thoracic esophagus, which is most likely seen in the settings of chronic gastroesophageal reflux disease versus esophagitis. No axillary, mediastinal or hilar lymphadenopathy by size criteria. Lungs/Pleura: The central tracheo-bronchial tree is patent. There are patchy areas of linear, plate-like atelectasis and/or scarring throughout bilateral lungs. There is a new spiculated marginated 9 x 12 mm nodule in the posterior segment of right upper lobe, highly concerning for metastases. There is an additional, part solid nodule in the right upper lobe (series 5, image 49), which measures 4 x 6 mm. There is a 2 x 4 mm noncalcified nodule in the left lung apex (series 5, image 33), which is indeterminate. No lung mass, consolidation,  pleural effusion or pneumothorax. Upper Abdomen:  There is dilation of extrahepatic bile duct and central intrahepatic bile ducts. Please refer to recent CT scan abdomen and pelvis report for details. Remaining visualized upper abdominal viscera within normal limits. Musculoskeletal: The visualized soft tissues of the chest wall are grossly unremarkable. No suspicious osseous lesions. There are minimal multilevel degenerative changes in the visualized spine. IMPRESSION: 1. There is a new spiculated marginated 9 x 12 mm nodule in the posterior segment of right upper lobe, highly concerning for metastases. There is an additional, part solid nodule in the right upper lobe , which measures 4 x 6 mm. 2. No lung mass, consolidation, pleural effusion or pneumothorax. 3. No mediastinal, hilar or axillary lymphadenopathy by size criteria. 4. Multiple other nonacute observations, as described above. Aortic Atherosclerosis (ICD10-I70.0). Electronically Signed   By: Ree Molt M.D.   On: 04/03/2024 17:23   DG Lumbar Spine Complete Result Date: 04/01/2024 CLINICAL DATA:  Low back pain EXAM: LUMBAR SPINE - COMPLETE 4+ VIEW COMPARISON:  None Available. FINDINGS: There is no evidence of lumbar spine fracture. Alignment is normal. Intervertebral disc spaces are maintained. There mild degenerative endplate osteophytes throughout the lumbar spine. Air-fluid level in the pelvis may be within the bladder. IMPRESSION: 1. Mild degenerative changes of the lumbar spine. 2. Air-fluid level in the pelvis may be within the bladder. Electronically Signed   By: Greig Pique M.D.   On: 04/01/2024 22:45   DG FEMUR MIN 2 VIEWS LEFT Result Date: 04/01/2024 CLINICAL DATA:  Low back pain EXAM: LEFT FEMUR 2 VIEWS COMPARISON:  None Available. FINDINGS: There is no evidence of fracture or other focal bone lesions. Peripheral vascular calcifications are present. Joint spaces are well maintained. IMPRESSION: 1. No acute fracture or dislocation. 2. Peripheral vascular calcifications.  Electronically Signed   By: Greig Pique M.D.   On: 04/01/2024 22:44   CT ABDOMEN PELVIS W CONTRAST Result Date: 03/29/2024 CLINICAL DATA:  Left pelvic and back pain. Uterine carcinoma. * Tracking Code: BO * EXAM: CT ABDOMEN AND PELVIS WITH CONTRAST TECHNIQUE: Multidetector CT imaging of the abdomen and pelvis was performed using the standard protocol following bolus administration of intravenous contrast. RADIATION DOSE REDUCTION: This exam was performed according to the departmental dose-optimization program which includes automated exposure control, adjustment of the mA and/or kV according to patient size and/or use of iterative reconstruction technique. CONTRAST:  OMNIPAQUE  IOHEXOL  300 MG/ML  SOLN COMPARISON:  12/08/2021 FINDINGS: Lower Chest: No acute findings. Hepatobiliary: No suspicious hepatic masses identified. Prior cholecystectomy. Stable diffuse biliary ductal dilatation, likely due to post cholecystectomy effect. Pancreas:  No mass or inflammatory changes. Spleen: Within normal limits in size and appearance. Adrenals/Urinary Tract: No suspicious masses identified. No evidence of ureteral calculi or hydronephrosis. Gas seen in urinary bladder, presumably from recent instrumentation. Stomach/Bowel: Tiny hiatal hernia again noted. No evidence of obstruction, inflammatory process or abnormal fluid collections. Vascular/Lymphatic: Large left retroperitoneal mass is seen involving the left psoas muscle and left paraaortic region with mild cortical destruction seen involving the adjacent lumbar vertebral bodies. This is new in measures 7.6 x 7.3 cm, consistent with metastatic disease. Mild lymphadenopathy is also seen elsewhere in the left paraaortic region and left iliac chains, with largest measuring 1.8 cm in the left external iliac chain. No acute vascular findings. Reproductive:  Prior hysterectomy. Other:  Small fat-containing umbilical hernia. Musculoskeletal:  No suspicious bone lesions  identified. IMPRESSION: New large left retroperitoneal mass involving the left psoas muscle and  left paraaortic region with cortical destruction in the adjacent lumbar spine vertebral bodies, consistent with metastatic disease. Mild left paraaortic and left iliac lymphadenopathy, consistent with metastatic disease. Electronically Signed   By: Norleen DELENA Kil M.D.   On: 03/29/2024 11:37   CT PELVIS WO CONTRAST Result Date: 03/29/2024 CLINICAL DATA:  Left leg and lower back pain. History of uterine cancer. EXAM: CT PELVIS WITHOUT CONTRAST TECHNIQUE: Multidetector CT imaging of the pelvis was performed following the standard protocol without intravenous contrast. RADIATION DOSE REDUCTION: This exam was performed according to the departmental dose-optimization program which includes automated exposure control, adjustment of the mA and/or kV according to patient size and/or use of iterative reconstruction technique. COMPARISON:  12/08/2021 FINDINGS: Urinary Tract:  Gas is identified in the urinary bladder. Bowel:  Unremarkable visualized pelvic bowel loops. Vascular/Lymphatic: Atherosclerotic calcification noted in the common iliac arteries bilaterally. r 2.9 x 1.4 cm soft tissue nodule is seen along the course of the left external iliac artery and vein (52/6) with adjacent probable 12 mm short axis left pelvic sidewall lymph node on 62/6. Reproductive:  Uterus surgically absent. Other:  No intraperitoneal free fluid. Musculoskeletal: No worrisome lytic or sclerotic osseous abnormality. IMPRESSION: 1. New 2.9 x 1.4 cm soft tissue nodule along the course of the left external iliac artery and vein with adjacent probable 12 mm short axis left pelvic sidewall lymph node. By report, the patient is status post total abdominal hysterectomy with bilateral salpingo oophorectomy on 12/06/2021. As such, this soft tissue nodularity along the left pelvic sidewall is unlikely to represent ovary. In the setting of a uterine cancer  diagnosis, metastatic disease is a distinct concern. Dedicated CT abdomen/pelvis with oral and intravenous contrast recommended to further evaluate. 2. Gas in the urinary bladder. In the absence of recent instrumentation, bladder infection would be a primary consideration. 3.  Aortic Atherosclerosis (ICD10-I70.0). Electronically Signed   By: Camellia Candle M.D.   On: 03/29/2024 09:16   CT Lumbar Spine Wo Contrast Result Date: 03/29/2024 EXAM: CT OF THE LUMBAR SPINE WITHOUT CONTRAST 03/29/2024 08:11:43 AM TECHNIQUE: CT of the lumbar spine was performed without the administration of intravenous contrast. Multiplanar reformatted images are provided for review. Automated exposure control, iterative reconstruction, and/or weight based adjustment of the mA/kV was utilized to reduce the radiation dose to as low as reasonably achievable. COMPARISON: None available. CLINICAL HISTORY: Lumbar radiculopathy, cancer suspected. Per triage notes: Patient coming to ED for evaluation of L leg pain and lower back pain. Reports pain started 2 months ago. Has worsened over the last several days. Was seen by PCP on Thursday and was instructed to follow up with neurologist. Unable to sleep this weekend due to pain. No reports of injuries or new weakness. FINDINGS: BONES AND ALIGNMENT: Mild rightward curvature is present in the lumbar spine. No significant listhesis is present. DEGENERATIVE CHANGES: A leftward disc protrusion and asymmetric facet hypertrophy contribute to moderate left foraminal stenosis at L3-4. A leftward disc protrusion and asymmetric facet hypertrophy contribute to moderate left and mild right foraminal narrowing at L4-5. A leftward disc protrusion is present at L5 to S1. Facet hypertrophy is asymmetric on the right. Mild foraminal narrowing is present bilaterally. SOFT TISSUES: A retroperitoneal left paraspinous soft tissue mass measures 7.5 x 5.5 x 8.5 cm, centered at the L2-3 level. The tumor encroaches on the  lateral margin of the left foramen at L2-3. IMPRESSION: 1. Retroperitoneal left paraspinous soft tissue mass measuring 7.5 x 5.5 x 8.5 cm, centered at  the L2-3 level, encroaching on the lateral margin of the left foramen at L2-3. This is most concerning for metastatic disease. Primary sarcoma could have a similar appearance. Recommend CT of the chest, abdomen and pelvis with contrast for further evaluation 2. Moderate left foraminal stenosis at L3-4 due to leftward disc protrusion and asymmetric facet hypertrophy. 3. Moderate left and mild right foraminal narrowing at L4-5 due to leftward disc protrusion and asymmetric facet hypertrophy. 4. Mild bilateral foraminal narrowing at L5-S1 due to leftward disc protrusion and right asymmetric facet hypertrophy. Electronically signed by: Lonni Necessary MD 03/29/2024 08:38 AM EDT RP Workstation: [id number]       The results of significant diagnostics from this hospitalization (including imaging, microbiology, ancillary and laboratory) are listed below for reference.     Microbiology: Recent Results (from the past 240 hours)  Urine Culture (for pregnant, neutropenic or urologic patients or patients with an indwelling urinary catheter)     Status: Abnormal   Collection Time: 04/21/24  8:06 AM   Specimen: Urine, Clean Catch  Result Value Ref Range Status   Specimen Description URINE, CLEAN CATCH  Final   Special Requests NONE  Final   Culture (A)  Final    80,000 COLONIES/mL LACTOBACILLUS SPECIES Standardized susceptibility testing for this organism is not available. Performed at [facility], [address] [place], [place] [address]    Report Status 04/22/2024 FINAL  Final  Culture, blood (Routine X 2) w Reflex to ID Panel     Status: None (Preliminary result)   Collection Time: 04/21/24 10:05 AM   Specimen: BLOOD LEFT HAND  Result Value Ref Range Status   Specimen Description BLOOD LEFT HAND  Final   Special Requests   Final     BOTTLES DRAWN AEROBIC AND ANAEROBIC Blood Culture adequate volume   Culture   Final    NO GROWTH 3 DAYS Performed at [facility], [address] [place], [place] [address]    Report Status PENDING  Incomplete  Culture, blood (Routine X 2) w Reflex to ID Panel     Status: None (Preliminary result)   Collection Time: 04/21/24 10:05 AM   Specimen: BLOOD RIGHT HAND  Result Value Ref Range Status   Specimen Description BLOOD RIGHT HAND  Final   Special Requests   Final    BOTTLES DRAWN AEROBIC ONLY Blood Culture adequate volume   Culture   Final    NO GROWTH 3 DAYS Performed at [facility], [address], [place], [place] [address]    Report Status PENDING  Incomplete     Labs:  CBC: Recent Labs  Lab 04/20/24 1344 04/20/24 1351 04/21/24 0453 04/22/24 0316 04/23/24 0314 04/24/24 0320  WBC 20.8*  --  19.2* 19.1* 14.4* 13.7*  NEUTROABS 18.9*  --   --   --   --   --   HGB 10.3* 10.9* 9.2* 9.0* 7.8* 8.3*  HCT 32.2* 32.0* 27.9* 28.3* 24.7* 26.1*  MCV 84.3  --  81.8 84.2 85.8 83.9  PLT 334  --  346 326 268 282   BMP &GFR Recent Labs  Lab 04/21/24 1541 04/22/24 0316 04/22/24 1502 04/23/24 0314 04/24/24 0320 04/24/24 1403  NA  --  136 135 142 147* 141  K  --  6.1* 3.9 4.8 5.0 3.5  CL  --  101 97* 107 108 103  CO2  --  22 25 25 27 25   GLUCOSE  --  261* 194* 158*  186* 142*  BUN  --  7 9 6 6 6   CREATININE  --  0.79 0.62 0.53 0.62 0.60  CALCIUM   --  8.7* 8.1* 7.7* 8.7* 8.5*  MG 1.1* 2.0  --  1.4* 1.8  --   PHOS 2.7 3.6 2.8 3.0 3.0 2.6   Estimated Creatinine Clearance: 76.4 mL/min (by C-G formula based on SCr of 0.6 mg/dL). Liver & Pancreas: Recent Labs  Lab 04/20/24 1344 04/21/24 0453 04/22/24 0316 04/22/24 1502 04/23/24 0314 04/24/24 0320 04/24/24 1403  AST 15 14*  --   --   --   --   --   ALT 12 11  --   --   --   --   --   ALKPHOS 134* 121  --   --   --   --   --   BILITOT 2.0* 1.2  --   --   --   --   --   PROT 6.8 5.0*  --   --   --   --    --   ALBUMIN 2.5* 2.2* 2.2* 2.0* 1.8* 2.0* 2.2*   No results for input(s): LIPASE, AMYLASE in the last 168 hours. Recent Labs  Lab 04/21/24 1541  AMMONIA 13   Diabetic: No results for input(s): HGBA1C in the last 72 hours. Recent Labs  Lab 04/23/24 1556 04/23/24 2150 04/23/24 2153 04/24/24 0627 04/24/24 1157  GLUCAP 146* 209* 226* 175* 188*   Cardiac Enzymes: No results for input(s): CKTOTAL, CKMB, CKMBINDEX, TROPONINI in the last 168 hours. No results for input(s): PROBNP in the last 8760 hours. Coagulation Profile: Recent Labs  Lab 04/20/24 1344  INR 1.2   Thyroid  Function Tests: Recent Labs    04/23/24 1330  TSH 2.314   Lipid Profile: No results for input(s): CHOL, HDL, LDLCALC, TRIG, CHOLHDL, LDLDIRECT in the last 72 hours. Anemia Panel: No results for input(s): VITAMINB12, FOLATE, FERRITIN, TIBC, IRON, RETICCTPCT in the last 72 hours. Urine analysis:    Component Value Date/Time   COLORURINE YELLOW 04/20/2024 1851   APPEARANCEUR CLEAR 04/20/2024 1851   LABSPEC 1.036 (H) 04/20/2024 1851   PHURINE 6.0 04/20/2024 1851   GLUCOSEU 50 (A) 04/20/2024 1851   HGBUR SMALL (A) 04/20/2024 1851   BILIRUBINUR NEGATIVE 04/20/2024 1851   BILIRUBINUR neg 08/16/2016 1147   KETONESUR 80 (A) 04/20/2024 1851   PROTEINUR NEGATIVE 04/20/2024 1851   UROBILINOGEN 2.0 08/16/2016 1147   UROBILINOGEN 2.0 (H) 02/22/2015 2143   NITRITE NEGATIVE 04/20/2024 1851   LEUKOCYTESUR NEGATIVE 04/20/2024 1851   Sepsis Labs: Invalid input(s): PROCALCITONIN, LACTICIDVEN   SIGNED:  [place] Tober T Alicia Ackert, MD  [facility] 04/24/2024, 4:33 PM

## 2024-04-26 LAB — CULTURE, BLOOD (ROUTINE X 2)
Culture: NO GROWTH
Culture: NO GROWTH
Special Requests: ADEQUATE
Special Requests: ADEQUATE

## 2024-04-27 ENCOUNTER — Other Ambulatory Visit: Payer: Self-pay | Admitting: Hematology and Oncology

## 2024-04-27 ENCOUNTER — Telehealth: Payer: Self-pay

## 2024-04-27 ENCOUNTER — Inpatient Hospital Stay

## 2024-04-27 ENCOUNTER — Inpatient Hospital Stay: Admitting: Hematology and Oncology

## 2024-04-27 ENCOUNTER — Encounter: Payer: Self-pay | Admitting: Hematology and Oncology

## 2024-04-27 VITALS — BP 133/50 | HR 94 | Temp 97.7°F | Resp 18 | Ht 62.0 in | Wt 191.2 lb

## 2024-04-27 DIAGNOSIS — Z5112 Encounter for antineoplastic immunotherapy: Secondary | ICD-10-CM | POA: Diagnosis present

## 2024-04-27 DIAGNOSIS — C541 Malignant neoplasm of endometrium: Secondary | ICD-10-CM

## 2024-04-27 DIAGNOSIS — Z794 Long term (current) use of insulin: Secondary | ICD-10-CM

## 2024-04-27 DIAGNOSIS — D638 Anemia in other chronic diseases classified elsewhere: Secondary | ICD-10-CM | POA: Diagnosis not present

## 2024-04-27 DIAGNOSIS — E118 Type 2 diabetes mellitus with unspecified complications: Secondary | ICD-10-CM

## 2024-04-27 DIAGNOSIS — Z5111 Encounter for antineoplastic chemotherapy: Secondary | ICD-10-CM | POA: Diagnosis present

## 2024-04-27 DIAGNOSIS — D492 Neoplasm of unspecified behavior of bone, soft tissue, and skin: Secondary | ICD-10-CM | POA: Insufficient documentation

## 2024-04-27 DIAGNOSIS — G893 Neoplasm related pain (acute) (chronic): Secondary | ICD-10-CM | POA: Diagnosis not present

## 2024-04-27 DIAGNOSIS — Z79620 Long term (current) use of immunosuppressive biologic: Secondary | ICD-10-CM | POA: Diagnosis not present

## 2024-04-27 DIAGNOSIS — Z87891 Personal history of nicotine dependence: Secondary | ICD-10-CM | POA: Diagnosis not present

## 2024-04-27 LAB — CMP (CANCER CENTER ONLY)
ALT: 6 U/L (ref 0–44)
AST: 9 U/L — ABNORMAL LOW (ref 15–41)
Albumin: 3 g/dL — ABNORMAL LOW (ref 3.5–5.0)
Alkaline Phosphatase: 96 U/L (ref 38–126)
Anion gap: 7 (ref 5–15)
BUN: 9 mg/dL (ref 6–20)
CO2: 28 mmol/L (ref 22–32)
Calcium: 8.4 mg/dL — ABNORMAL LOW (ref 8.9–10.3)
Chloride: 99 mmol/L (ref 98–111)
Creatinine: 0.54 mg/dL (ref 0.44–1.00)
GFR, Estimated: >60 mL/min (ref >60)
Glucose, Bld: 267 mg/dL — ABNORMAL HIGH (ref 70–99)
Potassium: 3.8 mmol/L (ref 3.5–5.1)
Sodium: 134 mmol/L — ABNORMAL LOW (ref 135–145)
Total Bilirubin: 0.3 mg/dL (ref 0.0–1.2)
Total Protein: 6.3 g/dL — ABNORMAL LOW (ref 6.5–8.1)

## 2024-04-27 LAB — CBC WITH DIFFERENTIAL (CANCER CENTER ONLY)
Abs Immature Granulocytes: 0.13 K/uL — ABNORMAL HIGH (ref 0.00–0.07)
Basophils Absolute: 0.1 K/uL (ref 0.0–0.1)
Basophils Relative: 0 %
Eosinophils Absolute: 0.2 K/uL (ref 0.0–0.5)
Eosinophils Relative: 1 %
HCT: 28.1 % — ABNORMAL LOW (ref 36.0–46.0)
Hemoglobin: 9.1 g/dL — ABNORMAL LOW (ref 12.0–15.0)
Immature Granulocytes: 1 %
Lymphocytes Relative: 9 %
Lymphs Abs: 1.7 K/uL (ref 0.7–4.0)
MCH: 26.8 pg (ref 26.0–34.0)
MCHC: 32.4 g/dL (ref 30.0–36.0)
MCV: 82.6 fL (ref 80.0–100.0)
Monocytes Absolute: 1 K/uL (ref 0.1–1.0)
Monocytes Relative: 5 %
Neutro Abs: 15.4 K/uL — ABNORMAL HIGH (ref 1.7–7.7)
Neutrophils Relative %: 84 %
Platelet Count: 379 K/uL (ref 150–400)
RBC: 3.4 MIL/uL — ABNORMAL LOW (ref 3.87–5.11)
RDW: 15.1 % (ref 11.5–15.5)
WBC Count: 18.3 K/uL — ABNORMAL HIGH (ref 4.0–10.5)
nRBC: 0 % (ref 0.0–0.2)

## 2024-04-27 NOTE — Assessment & Plan Note (Addendum)
This is likely anemia of chronic disease. The patient denies recent history of bleeding such as epistaxis, hematuria or hematochezia. She is asymptomatic from the anemia. We will observe for now.  She does not require transfusion now. I do not recommend any further work-up at this time.   

## 2024-04-27 NOTE — Telephone Encounter (Signed)
 S/w to schedule appointments that had been postponed d/t recent hospital admission.  Patient scheduled for flush appointment and MD visit with Dr. Lonn today. Patient also scheduled for infusion appointment on Friday, 8/29. Appointments scheduled and confirmed with patient.  Received approval from charge RN to schedule infusion on Friday.

## 2024-04-27 NOTE — Assessment & Plan Note (Addendum)
 The patient was originally diagnosed with uterine cancer in 2023, status post surgery and adjuvant radiation treatment She was noted to have cancer recurrence in July 2025 when she presented with severe back pain with CT imaging showing recurrent disease in the retroperitoneal lymph node.  CT imaging of the chest shows spiculated lung mass suspicious for metastatic spread Pathology: Left retroperitoneal mass biopsy confirmed adenocarcinoma, ER positive, p53 wild-type.  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  However, the patient was admitted to the hospital for evaluation of altered mental status/suspected stroke.  All her workup came back negative for stroke but the patient did present with altered mental status likely due to diabetic ketoacidosis MRI of the head was negative  Today, she is ready to resume plan for chemotherapy We will draw labs today and she will start her first dose of treatment on Friday As previously discussed, due to significant baseline peripheral neuropathy, I plan upfront dose reduction of paclitaxel  I reviewed side effects to be expected from treatment Plan to repeat imaging study after 3 cycles of therapy

## 2024-04-27 NOTE — Assessment & Plan Note (Addendum)
 Due to recent altered mental status, the dosage of her pain medications were reduced Her pain is reasonably controlled and she only took 2 doses of oxycodone  yesterday We discussed narcotic refill policy

## 2024-04-27 NOTE — Assessment & Plan Note (Addendum)
 We discussed importance of close vigilance and monitoring of blood sugar I will defer to her primary care doctor for management of her diabetes

## 2024-04-27 NOTE — Progress Notes (Signed)
 [facility] OFFICE PROGRESS NOTE  Patient Care Team: Lonn Hicks, MD as PCP - General (Hematology and Oncology) Wonda Sharper, MD as PCP - Cardiology (Cardiology) Lonn Hicks, MD as Consulting Physician (Hematology and Oncology)  Assessment & Plan Endometrial adenocarcinoma [facility]) The patient was originally diagnosed with uterine cancer in 2023, status post surgery and adjuvant radiation treatment She was noted to have cancer recurrence in July 2025 when she presented with severe back pain with CT imaging showing recurrent disease in the retroperitoneal lymph node.  CT imaging of the chest shows spiculated lung mass suspicious for metastatic spread Pathology: Left retroperitoneal mass biopsy confirmed adenocarcinoma, ER positive, p53 wild-type.  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  However, the patient was admitted to the hospital for evaluation of altered mental status/suspected stroke.  All her workup came back negative for stroke but the patient did present with altered mental status likely due to diabetic ketoacidosis MRI of the head was negative  Today, she is ready to resume plan for chemotherapy We will draw labs today and she will start her first dose of treatment on Friday As previously discussed, due to significant baseline peripheral neuropathy, I plan upfront dose reduction of paclitaxel  I reviewed side effects to be expected from treatment Plan to repeat imaging study after 3 cycles of therapy Type 2 diabetes mellitus with complication, with long-term current use of insulin  (HCC) We discussed importance of close vigilance and monitoring of blood sugar I will defer to her primary care doctor for management of her diabetes Cancer associated pain Due to recent altered mental status, the dosage of her pain medications were reduced Her pain is reasonably controlled and she only took 2  doses of oxycodone  yesterday We discussed narcotic refill policy Anemia due to chronic illness This is likely anemia of chronic disease. The patient denies recent history of bleeding such as epistaxis, hematuria or hematochezia. She is asymptomatic from the anemia. We will observe for now.  She does not require transfusion now. I do not recommend any further work-up at this time.    No orders of the defined types were placed in this encounter.    Hicks Lonn, MD  INTERVAL HISTORY: she returns for treatment follow-up She was supposed to start treatment last week but was admitted to the hospital due to altered mental status, thought to be multifactorial Multiple imaging studies excluded intracranial metastasis She is improved back to baseline Her pain is well-controlled Her son request prescription of wheelchair for her  PHYSICAL EXAMINATION: ECOG PERFORMANCE STATUS: 2 - Symptomatic, <50% confined to bed  No results found for: CAN125    Latest Ref Rng & Units 04/27/2024    2:38 PM 04/24/2024    3:20 AM 04/23/2024    3:14 AM  CBC  WBC 4.0 - 10.5 K/uL 18.3  13.7  14.4   Hemoglobin 12.0 - 15.0 g/dL 9.1  8.3  7.8   Hematocrit 36.0 - 46.0 % 28.1  26.1  24.7   Platelets 150 - 400 K/uL 379  282  268       Chemistry      Component Value Date/Time   NA 134 (L) 04/27/2024 1438   NA 138 01/25/2023 0945   K 3.8 04/27/2024 1438   CL 99 04/27/2024 1438   CO2 28 04/27/2024 1438   BUN 9 04/27/2024 1438   BUN 13 01/25/2023 0945  CREATININE 0.54 04/27/2024 1438   CREATININE 0.83 03/29/2016 1030      Component Value Date/Time   CALCIUM  8.4 (L) 04/27/2024 1438   ALKPHOS 96 04/27/2024 1438   AST 9 (L) 04/27/2024 1438   ALT 6 04/27/2024 1438   BILITOT 0.3 04/27/2024 1438       Vitals:   04/27/24 1519  BP: (!) 133/50  Pulse: 94  Resp: 18  Temp: 97.7 F (36.5 C)  SpO2: 100%   Filed Weights   04/27/24 1519  Weight: 191 lb 3.2 oz (86.7 kg)   Other relevant data reviewed during  this visit included CBC and CMP, imaging studies from August 2025

## 2024-04-28 LAB — T4: T4, Total: 8.4 ug/dL (ref 4.5–12.0)

## 2024-04-28 LAB — TSH: TSH: 3.99 u[IU]/mL (ref 0.350–4.500)

## 2024-04-30 MED FILL — Fosaprepitant Dimeglumine For IV Infusion 150 MG (Base Eq): INTRAVENOUS | Qty: 5 | Status: AC

## 2024-05-01 ENCOUNTER — Inpatient Hospital Stay

## 2024-05-01 ENCOUNTER — Inpatient Hospital Stay: Admitting: Licensed Clinical Social Worker

## 2024-05-01 VITALS — BP 125/55 | HR 77 | Temp 97.9°F | Resp 18

## 2024-05-01 DIAGNOSIS — Z5112 Encounter for antineoplastic immunotherapy: Secondary | ICD-10-CM | POA: Diagnosis not present

## 2024-05-01 DIAGNOSIS — C541 Malignant neoplasm of endometrium: Secondary | ICD-10-CM

## 2024-05-01 MED ORDER — DEXAMETHASONE SODIUM PHOSPHATE 10 MG/ML IJ SOLN
10.0000 mg | Freq: Once | INTRAMUSCULAR | Status: AC
  Administered 2024-05-01: 10 mg via INTRAVENOUS
  Filled 2024-05-01: qty 1

## 2024-05-01 MED ORDER — SODIUM CHLORIDE 0.9 % IV SOLN
80.0000 mg/m2 | Freq: Once | INTRAVENOUS | Status: AC
  Administered 2024-05-01: 156 mg via INTRAVENOUS
  Filled 2024-05-01: qty 26

## 2024-05-01 MED ORDER — DIPHENHYDRAMINE HCL 50 MG/ML IJ SOLN
12.5000 mg | Freq: Once | INTRAMUSCULAR | Status: AC
  Administered 2024-05-01: 12.5 mg via INTRAVENOUS
  Filled 2024-05-01: qty 1

## 2024-05-01 MED ORDER — PALONOSETRON HCL INJECTION 0.25 MG/5ML
0.2500 mg | Freq: Once | INTRAVENOUS | Status: AC
  Administered 2024-05-01: 0.25 mg via INTRAVENOUS
  Filled 2024-05-01: qty 5

## 2024-05-01 MED ORDER — SODIUM CHLORIDE 0.9 % IV SOLN
150.0000 mg | Freq: Once | INTRAVENOUS | Status: AC
  Administered 2024-05-01: 150 mg via INTRAVENOUS
  Filled 2024-05-01: qty 150

## 2024-05-01 MED ORDER — SODIUM CHLORIDE 0.9 % IV SOLN
INTRAVENOUS | Status: DC
Start: 2024-05-01 — End: 2024-05-01

## 2024-05-01 MED ORDER — SODIUM CHLORIDE 0.9 % IV SOLN
637.5000 mg | Freq: Once | INTRAVENOUS | Status: AC
  Administered 2024-05-01: 640 mg via INTRAVENOUS
  Filled 2024-05-01: qty 64

## 2024-05-01 MED ORDER — FAMOTIDINE IN NACL 20-0.9 MG/50ML-% IV SOLN
20.0000 mg | Freq: Once | INTRAVENOUS | Status: AC
  Administered 2024-05-01: 20 mg via INTRAVENOUS
  Filled 2024-05-01: qty 50

## 2024-05-01 MED ORDER — SODIUM CHLORIDE 0.9 % IV SOLN
200.0000 mg | Freq: Once | INTRAVENOUS | Status: AC
  Administered 2024-05-01: 200 mg via INTRAVENOUS
  Filled 2024-05-01: qty 200

## 2024-05-01 NOTE — Patient Instructions (Signed)
 [facility] - A DEPT OF [facility]  Discharge Instructions: Thank you for choosing [facility] to provide your oncology and hematology care.   If you have a lab appointment with the Cancer Center, please go directly to the Cancer Center and check in at the registration area.   Wear comfortable clothing and clothing appropriate for easy access to any Portacath or PICC line.   We strive to give you quality time with your provider. You may need to reschedule your appointment if you arrive late (15 or more minutes).  Arriving late affects you and other patients whose appointments are after yours.  Also, if you miss three or more appointments without notifying the office, you may be dismissed from the clinic at the provider's discretion.      For prescription refill requests, have your pharmacy contact our office and allow 72 hours for refills to be completed.    Today you received the following chemotherapy and/or immunotherapy agents: Keytruda, Taxol, Carboplatin      To help prevent nausea and vomiting after your treatment, we encourage you to take your nausea medication as directed.  BELOW ARE SYMPTOMS THAT SHOULD BE REPORTED IMMEDIATELY: *FEVER GREATER THAN 100.4 F (38 C) OR HIGHER *CHILLS OR SWEATING *NAUSEA AND VOMITING THAT IS NOT CONTROLLED WITH YOUR NAUSEA MEDICATION *UNUSUAL SHORTNESS OF BREATH *UNUSUAL BRUISING OR BLEEDING *URINARY PROBLEMS (pain or burning when urinating, or frequent urination) *BOWEL PROBLEMS (unusual diarrhea, constipation, pain near the anus) TENDERNESS IN MOUTH AND THROAT WITH OR WITHOUT PRESENCE OF ULCERS (sore throat, sores in mouth, or a toothache) UNUSUAL RASH, SWELLING OR PAIN  UNUSUAL VAGINAL DISCHARGE OR ITCHING   Items with * indicate a potential emergency and should be followed up as soon as possible or go to the Emergency Department if any problems should occur.  Please show the CHEMOTHERAPY ALERT  CARD or IMMUNOTHERAPY ALERT CARD at check-in to the Emergency Department and triage nurse.  Should you have questions after your visit or need to cancel or reschedule your appointment, please contact [facility] - A DEPT OF [facility]  Dept: [phone]  and follow the prompts.  Office hours are 8:00 a.m. to 4:30 p.m. Monday - Friday. Please note that voicemails left after 4:00 p.m. may not be returned until the following business day.  We are closed weekends and major holidays. You have access to a nurse at all times for urgent questions. Please call the main number to the clinic Dept: [phone] and follow the prompts.   For any non-urgent questions, you may also contact your provider using MyChart. We now offer e-Visits for anyone 40 and older to request care online for non-urgent symptoms. For details visit [email or web address].   Also download the MyChart app! Go to the app store, search "MyChart", open the app, select [place], and log in with your MyChart username and password.

## 2024-05-01 NOTE — Progress Notes (Signed)
 [facility] CSW Progress Note  Visual merchandiser met with patient to follow-up on need for community resources and emotional support. [name] husband (separated) present with her in infusion.   Interventions: Provided patient with information about [facility] office to help with updated guardian paperwork  Filled in [employer] [facility] application and discussed documents needed to complete application Education provided on counseling options and pt scheduled for counseling around adjustment to cancer      Follow Up Plan:  CSW will see patient on 05/07/24 for virtual counseling appointment;  Pt will collect proof of income documents and a bill for [employer] application    Vita Currin E Marieelena Bartko, LCSW Clinical Social Worker [facility]

## 2024-05-04 ENCOUNTER — Other Ambulatory Visit: Payer: Self-pay

## 2024-05-04 ENCOUNTER — Observation Stay (HOSPITAL_COMMUNITY)
Admission: EM | Admit: 2024-05-04 | Discharge: 2024-05-05 | Disposition: A | Discharge status: Home / Self Care | Attending: Internal Medicine | Admitting: Internal Medicine

## 2024-05-04 ENCOUNTER — Emergency Department (HOSPITAL_COMMUNITY)

## 2024-05-04 DIAGNOSIS — C799 Secondary malignant neoplasm of unspecified site: Secondary | ICD-10-CM

## 2024-05-04 DIAGNOSIS — J9611 Chronic respiratory failure with hypoxia: Secondary | ICD-10-CM | POA: Diagnosis not present

## 2024-05-04 DIAGNOSIS — E785 Hyperlipidemia, unspecified: Secondary | ICD-10-CM | POA: Insufficient documentation

## 2024-05-04 DIAGNOSIS — F419 Anxiety disorder, unspecified: Secondary | ICD-10-CM | POA: Insufficient documentation

## 2024-05-04 DIAGNOSIS — C3490 Malignant neoplasm of unspecified part of unspecified bronchus or lung: Secondary | ICD-10-CM | POA: Insufficient documentation

## 2024-05-04 DIAGNOSIS — F32A Depression, unspecified: Secondary | ICD-10-CM | POA: Insufficient documentation

## 2024-05-04 DIAGNOSIS — Z7984 Long term (current) use of oral hypoglycemic drugs: Secondary | ICD-10-CM | POA: Insufficient documentation

## 2024-05-04 DIAGNOSIS — Z8679 Personal history of other diseases of the circulatory system: Secondary | ICD-10-CM | POA: Diagnosis not present

## 2024-05-04 DIAGNOSIS — Z794 Long term (current) use of insulin: Secondary | ICD-10-CM | POA: Insufficient documentation

## 2024-05-04 DIAGNOSIS — Z79899 Other long term (current) drug therapy: Secondary | ICD-10-CM | POA: Insufficient documentation

## 2024-05-04 DIAGNOSIS — Z87891 Personal history of nicotine dependence: Secondary | ICD-10-CM | POA: Diagnosis not present

## 2024-05-04 DIAGNOSIS — E871 Hypo-osmolality and hyponatremia: Secondary | ICD-10-CM | POA: Insufficient documentation

## 2024-05-04 DIAGNOSIS — G893 Neoplasm related pain (acute) (chronic): Secondary | ICD-10-CM | POA: Diagnosis present

## 2024-05-04 DIAGNOSIS — F418 Other specified anxiety disorders: Secondary | ICD-10-CM | POA: Diagnosis not present

## 2024-05-04 DIAGNOSIS — J189 Pneumonia, unspecified organism: Principal | ICD-10-CM | POA: Insufficient documentation

## 2024-05-04 DIAGNOSIS — I509 Heart failure, unspecified: Secondary | ICD-10-CM | POA: Insufficient documentation

## 2024-05-04 DIAGNOSIS — I110 Hypertensive heart disease with heart failure: Secondary | ICD-10-CM | POA: Diagnosis not present

## 2024-05-04 DIAGNOSIS — E119 Type 2 diabetes mellitus without complications: Secondary | ICD-10-CM | POA: Diagnosis not present

## 2024-05-04 DIAGNOSIS — C541 Malignant neoplasm of endometrium: Secondary | ICD-10-CM | POA: Insufficient documentation

## 2024-05-04 DIAGNOSIS — I5032 Chronic diastolic (congestive) heart failure: Secondary | ICD-10-CM | POA: Diagnosis present

## 2024-05-04 DIAGNOSIS — C787 Secondary malignant neoplasm of liver and intrahepatic bile duct: Secondary | ICD-10-CM | POA: Diagnosis not present

## 2024-05-04 DIAGNOSIS — E118 Type 2 diabetes mellitus with unspecified complications: Secondary | ICD-10-CM

## 2024-05-04 DIAGNOSIS — G4733 Obstructive sleep apnea (adult) (pediatric): Secondary | ICD-10-CM | POA: Diagnosis not present

## 2024-05-04 DIAGNOSIS — R109 Unspecified abdominal pain: Secondary | ICD-10-CM

## 2024-05-04 DIAGNOSIS — I10 Essential (primary) hypertension: Secondary | ICD-10-CM | POA: Diagnosis present

## 2024-05-04 DIAGNOSIS — E7849 Other hyperlipidemia: Secondary | ICD-10-CM

## 2024-05-04 DIAGNOSIS — R4182 Altered mental status, unspecified: Secondary | ICD-10-CM | POA: Diagnosis present

## 2024-05-04 LAB — GLUCOSE, CAPILLARY
Glucose-Capillary: 180 mg/dL — ABNORMAL HIGH (ref 70–99)
Glucose-Capillary: 259 mg/dL — ABNORMAL HIGH (ref 70–99)
Glucose-Capillary: 266 mg/dL — ABNORMAL HIGH (ref 70–99)

## 2024-05-04 LAB — RESPIRATORY PANEL BY PCR

## 2024-05-04 LAB — CBC
HCT: 28.7 % — ABNORMAL LOW (ref 36.0–46.0)
HCT: 28.5 % — ABNORMAL LOW (ref 36.0–46.0)
Hemoglobin: 9.2 g/dL — ABNORMAL LOW (ref 12.0–15.0)
Hemoglobin: 8.8 g/dL — ABNORMAL LOW (ref 12.0–15.0)
MCH: 27 pg (ref 26.0–34.0)
MCH: 26 pg (ref 26.0–34.0)
MCHC: 32.1 g/dL (ref 30.0–36.0)
MCHC: 30.9 g/dL (ref 30.0–36.0)
MCV: 84.2 fL (ref 80.0–100.0)
MCV: 84.3 fL (ref 80.0–100.0)
Platelets: 270 K/uL (ref 150–400)
Platelets: 200 K/uL (ref 150–400)
RBC: 3.41 MIL/uL — ABNORMAL LOW (ref 3.87–5.11)
RBC: 3.38 MIL/uL — ABNORMAL LOW (ref 3.87–5.11)
RDW: 15.1 % (ref 11.5–15.5)
RDW: 15 % (ref 11.5–15.5)
WBC: 18.3 K/uL — ABNORMAL HIGH (ref 4.0–10.5)
WBC: 18.1 K/uL — ABNORMAL HIGH (ref 4.0–10.5)
nRBC: 0 % (ref 0.0–0.2)
nRBC: 0 % (ref 0.0–0.2)

## 2024-05-04 LAB — COMPREHENSIVE METABOLIC PANEL WITH GFR
ALT: 6 U/L (ref 0–44)
ALT: 9 U/L (ref 0–44)
AST: 23 U/L (ref 15–41)
AST: 20 U/L (ref 15–41)
Albumin: 3.2 g/dL — ABNORMAL LOW (ref 3.5–5.0)
Albumin: 3.3 g/dL — ABNORMAL LOW (ref 3.5–5.0)
Alkaline Phosphatase: 105 U/L (ref 38–126)
Alkaline Phosphatase: 103 U/L (ref 38–126)
Anion gap: 14 (ref 5–15)
Anion gap: 13 (ref 5–15)
BUN: 11 mg/dL (ref 8–23)
BUN: 10 mg/dL (ref 8–23)
CO2: 22 mmol/L (ref 22–32)
CO2: 24 mmol/L (ref 22–32)
Calcium: 9.4 mg/dL (ref 8.9–10.3)
Calcium: 9 mg/dL (ref 8.9–10.3)
Chloride: 96 mmol/L — ABNORMAL LOW (ref 98–111)
Chloride: 97 mmol/L — ABNORMAL LOW (ref 98–111)
Creatinine, Ser: 0.48 mg/dL (ref 0.44–1.00)
Creatinine, Ser: 0.48 mg/dL (ref 0.44–1.00)
GFR, Estimated: >60 mL/min (ref >60)
GFR, Estimated: >60 mL/min (ref >60)
Glucose, Bld: 147 mg/dL — ABNORMAL HIGH (ref 70–99)
Glucose, Bld: 177 mg/dL — ABNORMAL HIGH (ref 70–99)
Potassium: 4 mmol/L (ref 3.5–5.1)
Potassium: 4.3 mmol/L (ref 3.5–5.1)
Sodium: 132 mmol/L — ABNORMAL LOW (ref 135–145)
Sodium: 134 mmol/L — ABNORMAL LOW (ref 135–145)
Total Bilirubin: 0.8 mg/dL (ref 0.0–1.2)
Total Bilirubin: 0.5 mg/dL (ref 0.0–1.2)
Total Protein: 6.5 g/dL (ref 6.5–8.1)
Total Protein: 6.4 g/dL — ABNORMAL LOW (ref 6.5–8.1)

## 2024-05-04 LAB — STREP PNEUMONIAE URINARY ANTIGEN: Strep Pneumo Urinary Antigen: NEGATIVE

## 2024-05-04 LAB — BLOOD GAS, VENOUS
Acid-Base Excess: 4.2 mmol/L — ABNORMAL HIGH (ref 0.0–2.0)
Bicarbonate: 27.3 mmol/L (ref 20.0–28.0)
O2 Saturation: 90.6 %
Patient temperature: 37
pCO2, Ven: 35 mmHg — ABNORMAL LOW (ref 44–60)
pH, Ven: 7.5 — ABNORMAL HIGH (ref 7.25–7.43)
pO2, Ven: 57 mmHg — ABNORMAL HIGH (ref 32–45)

## 2024-05-04 LAB — URINALYSIS, ROUTINE W REFLEX MICROSCOPIC
Bilirubin Urine: NEGATIVE
Glucose, UA: 50 mg/dL — AB
Hgb urine dipstick: NEGATIVE
Ketones, ur: 5 mg/dL — AB
Leukocytes,Ua: NEGATIVE
Nitrite: NEGATIVE
Protein, ur: 100 mg/dL — AB
Specific Gravity, Urine: >1.046 — ABNORMAL HIGH (ref 1.005–1.030)
pH: 6 (ref 5.0–8.0)

## 2024-05-04 LAB — MRSA NEXT GEN BY PCR, NASAL: MRSA by PCR Next Gen: NOT DETECTED

## 2024-05-04 LAB — TROPONIN T, HIGH SENSITIVITY
Troponin T High Sensitivity: 18 ng/L (ref 0–19)
Troponin T High Sensitivity: 28 ng/L — ABNORMAL HIGH (ref 0–19)

## 2024-05-04 LAB — I-STAT CG4 LACTIC ACID, ED: Lactic Acid, Venous: 1 mmol/L (ref 0.5–1.9)

## 2024-05-04 LAB — PROCALCITONIN: Procalcitonin: <0.1 ng/mL

## 2024-05-04 MED ORDER — FLUOXETINE HCL 20 MG PO CAPS
40.0000 mg | ORAL_CAPSULE | Freq: Every day | ORAL | Status: DC
  Administered 2024-05-04 – 2024-05-05 (×2): 40 mg via ORAL
  Filled 2024-05-04 (×2): qty 2

## 2024-05-04 MED ORDER — INSULIN GLARGINE 100 UNIT/ML ~~LOC~~ SOLN
20.0000 [IU] | Freq: Every day | SUBCUTANEOUS | Status: DC
  Administered 2024-05-04 – 2024-05-05 (×2): 20 [IU] via SUBCUTANEOUS
  Filled 2024-05-04 (×2): qty 0.2

## 2024-05-04 MED ORDER — NITROGLYCERIN 0.4 MG SL SUBL: 0.4000 mg | SUBLINGUAL_TABLET | SUBLINGUAL | Status: DC | PRN

## 2024-05-04 MED ORDER — IPRATROPIUM-ALBUTEROL 0.5-2.5 (3) MG/3ML IN SOLN: 3.0000 mL | Freq: Four times a day (QID) | RESPIRATORY_TRACT | Status: DC | PRN

## 2024-05-04 MED ORDER — SODIUM CHLORIDE 0.9 % IV SOLN
500.0000 mg | Freq: Once | INTRAVENOUS | Status: AC
  Administered 2024-05-04: 500 mg via INTRAVENOUS
  Filled 2024-05-04: qty 5

## 2024-05-04 MED ORDER — SODIUM CHLORIDE 0.9 % IV SOLN
2.0000 g | Freq: Three times a day (TID) | INTRAVENOUS | Status: DC
  Administered 2024-05-04 – 2024-05-05 (×4): 2 g via INTRAVENOUS
  Filled 2024-05-04 (×4): qty 12.5

## 2024-05-04 MED ORDER — SODIUM CHLORIDE 0.9% FLUSH
3.0000 mL | Freq: Two times a day (BID) | INTRAVENOUS | Status: DC
  Administered 2024-05-04 – 2024-05-05 (×2): 3 mL via INTRAVENOUS

## 2024-05-04 MED ORDER — SENNOSIDES-DOCUSATE SODIUM 8.6-50 MG PO TABS: 1.0000 | ORAL_TABLET | Freq: Two times a day (BID) | ORAL | Status: DC | PRN

## 2024-05-04 MED ORDER — CHLORHEXIDINE GLUCONATE CLOTH 2 % EX PADS
6.0000 | MEDICATED_PAD | Freq: Every day | CUTANEOUS | Status: DC
  Administered 2024-05-04: 6 via TOPICAL

## 2024-05-04 MED ORDER — BUPROPION HCL ER (XL) 150 MG PO TB24
150.0000 mg | ORAL_TABLET | Freq: Every morning | ORAL | Status: DC
  Administered 2024-05-04 – 2024-05-05 (×2): 150 mg via ORAL
  Filled 2024-05-04 (×2): qty 1

## 2024-05-04 MED ORDER — IOHEXOL 300 MG/ML  SOLN
100.0000 mL | Freq: Once | INTRAMUSCULAR | Status: AC | PRN
  Administered 2024-05-04: 100 mL via INTRAVENOUS

## 2024-05-04 MED ORDER — FUROSEMIDE 40 MG PO TABS
40.0000 mg | ORAL_TABLET | Freq: Every day | ORAL | Status: DC
  Administered 2024-05-04 – 2024-05-05 (×2): 40 mg via ORAL
  Filled 2024-05-04 (×2): qty 1

## 2024-05-04 MED ORDER — SODIUM CHLORIDE 0.9% FLUSH
10.0000 mL | Freq: Two times a day (BID) | INTRAVENOUS | Status: DC
  Administered 2024-05-04 – 2024-05-05 (×3): 10 mL

## 2024-05-04 MED ORDER — OXYCODONE HCL 5 MG PO TABS
10.0000 mg | ORAL_TABLET | ORAL | Status: DC | PRN
  Administered 2024-05-04 – 2024-05-05 (×4): 10 mg via ORAL
  Filled 2024-05-04 (×4): qty 2

## 2024-05-04 MED ORDER — VANCOMYCIN HCL 1250 MG/250ML IV SOLN
1250.0000 mg | INTRAVENOUS | Status: DC
  Administered 2024-05-05: 1250 mg via INTRAVENOUS
  Filled 2024-05-04: qty 250

## 2024-05-04 MED ORDER — SODIUM CHLORIDE 0.9 % IV SOLN: 250.0000 mL | INTRAVENOUS | Status: AC | PRN

## 2024-05-04 MED ORDER — ATORVASTATIN CALCIUM 40 MG PO TABS
80.0000 mg | ORAL_TABLET | Freq: Every evening | ORAL | Status: DC
  Administered 2024-05-04: 80 mg via ORAL
  Filled 2024-05-04: qty 2

## 2024-05-04 MED ORDER — ENOXAPARIN SODIUM 40 MG/0.4ML IJ SOSY
40.0000 mg | PREFILLED_SYRINGE | INTRAMUSCULAR | Status: DC
  Administered 2024-05-04 – 2024-05-05 (×2): 40 mg via SUBCUTANEOUS
  Filled 2024-05-04 (×2): qty 0.4

## 2024-05-04 MED ORDER — ACETAMINOPHEN 325 MG PO TABS
650.0000 mg | ORAL_TABLET | Freq: Four times a day (QID) | ORAL | Status: DC | PRN
  Administered 2024-05-04: 650 mg via ORAL
  Filled 2024-05-04: qty 2

## 2024-05-04 MED ORDER — SODIUM CHLORIDE 0.9% FLUSH: 10.0000 mL | INTRAVENOUS | Status: DC | PRN

## 2024-05-04 MED ORDER — GUAIFENESIN ER 600 MG PO TB12: 600.0000 mg | ORAL_TABLET | Freq: Two times a day (BID) | ORAL | Status: DC | PRN

## 2024-05-04 MED ORDER — VANCOMYCIN HCL 1750 MG/350ML IV SOLN
1750.0000 mg | Freq: Once | INTRAVENOUS | Status: AC
  Administered 2024-05-04: 1750 mg via INTRAVENOUS
  Filled 2024-05-04 (×2): qty 350

## 2024-05-04 MED ORDER — PROCHLORPERAZINE MALEATE 10 MG PO TABS
10.0000 mg | ORAL_TABLET | Freq: Four times a day (QID) | ORAL | Status: DC | PRN
  Administered 2024-05-04: 10 mg via ORAL
  Filled 2024-05-04: qty 1

## 2024-05-04 MED ORDER — INSULIN ASPART 100 UNIT/ML IJ SOLN
0.0000 [IU] | Freq: Every day | INTRAMUSCULAR | Status: DC
  Administered 2024-05-04: 2 [IU] via SUBCUTANEOUS
  Filled 2024-05-04: qty 0.05

## 2024-05-04 MED ORDER — SODIUM CHLORIDE 0.9% FLUSH
3.0000 mL | Freq: Two times a day (BID) | INTRAVENOUS | Status: DC
  Administered 2024-05-04 – 2024-05-05 (×3): 3 mL via INTRAVENOUS

## 2024-05-04 MED ORDER — HYDROMORPHONE HCL 1 MG/ML IJ SOLN
1.0000 mg | Freq: Once | INTRAMUSCULAR | Status: AC
  Administered 2024-05-04: 1 mg via INTRAVENOUS
  Filled 2024-05-04: qty 1

## 2024-05-04 MED ORDER — LOSARTAN POTASSIUM 50 MG PO TABS
25.0000 mg | ORAL_TABLET | Freq: Every day | ORAL | Status: DC
  Administered 2024-05-04 – 2024-05-05 (×2): 25 mg via ORAL
  Filled 2024-05-04 (×2): qty 1

## 2024-05-04 MED ORDER — SODIUM CHLORIDE 0.9 % IV SOLN
2.0000 g | Freq: Once | INTRAVENOUS | Status: AC
  Administered 2024-05-04: 2 g via INTRAVENOUS
  Filled 2024-05-04: qty 20

## 2024-05-04 MED ORDER — SENNOSIDES-DOCUSATE SODIUM 8.6-50 MG PO TABS
1.0000 | ORAL_TABLET | Freq: Two times a day (BID) | ORAL | Status: DC
  Administered 2024-05-04 – 2024-05-05 (×3): 1 via ORAL
  Filled 2024-05-04 (×3): qty 1

## 2024-05-04 MED ORDER — SODIUM CHLORIDE 0.9% FLUSH: 3.0000 mL | INTRAVENOUS | Status: DC | PRN

## 2024-05-04 MED ORDER — INSULIN GLARGINE 100 UNITS/ML SOLOSTAR PEN: 20.0000 [IU] | PEN_INJECTOR | Freq: Every morning | SUBCUTANEOUS | Status: DC

## 2024-05-04 MED ORDER — SODIUM CHLORIDE 0.9 % IV BOLUS
500.0000 mL | Freq: Once | INTRAVENOUS | Status: AC
  Administered 2024-05-04: 500 mL via INTRAVENOUS

## 2024-05-04 MED ORDER — ONDANSETRON HCL 4 MG/2ML IJ SOLN
4.0000 mg | Freq: Once | INTRAMUSCULAR | Status: AC
  Administered 2024-05-04: 4 mg via INTRAVENOUS
  Filled 2024-05-04: qty 2

## 2024-05-04 MED ORDER — PRASUGREL HCL 10 MG PO TABS
5.0000 mg | ORAL_TABLET | Freq: Every morning | ORAL | Status: DC
  Administered 2024-05-04 – 2024-05-05 (×2): 5 mg via ORAL
  Filled 2024-05-04 (×3): qty 1

## 2024-05-04 MED ORDER — ACETAMINOPHEN 650 MG RE SUPP
650.0000 mg | Freq: Four times a day (QID) | RECTAL | Status: DC | PRN
Start: 2024-05-04 — End: 2024-05-05

## 2024-05-04 MED ORDER — OXYCODONE HCL 5 MG PO TABS
5.0000 mg | ORAL_TABLET | ORAL | Status: DC | PRN
  Administered 2024-05-04: 5 mg via ORAL
  Filled 2024-05-04: qty 1

## 2024-05-04 MED ORDER — PREGABALIN 50 MG PO CAPS
50.0000 mg | ORAL_CAPSULE | Freq: Two times a day (BID) | ORAL | Status: DC
  Administered 2024-05-04 – 2024-05-05 (×3): 50 mg via ORAL
  Filled 2024-05-04 (×3): qty 1

## 2024-05-04 MED ORDER — MORPHINE SULFATE ER 15 MG PO TBCR
15.0000 mg | EXTENDED_RELEASE_TABLET | Freq: Every day | ORAL | Status: DC
  Administered 2024-05-04: 15 mg via ORAL
  Filled 2024-05-04: qty 1

## 2024-05-04 MED ORDER — INSULIN ASPART 100 UNIT/ML IJ SOLN
0.0000 [IU] | Freq: Three times a day (TID) | INTRAMUSCULAR | Status: DC
  Administered 2024-05-04 (×2): 3 [IU] via SUBCUTANEOUS
  Administered 2024-05-05: 5 [IU] via SUBCUTANEOUS
  Administered 2024-05-05: 1 [IU] via SUBCUTANEOUS
  Filled 2024-05-04: qty 0.06

## 2024-05-04 NOTE — ED Notes (Addendum)
 Pt was placed on bedpan to use the bathroom but was unsuccessful

## 2024-05-04 NOTE — ED Provider Notes (Signed)
 [facility] Emergency Department Provider Note MRN:  [id number]  Arrival date & time: 05/04/24     Chief Complaint   Altered Mental Status   History of Present Illness   Brenda [name] is a 63 y.o. year-old female with a history of endometrial cancer, CHF, diabetes presenting to the ED with chief complaint of altered mental status.  Family explains the patient was recently significantly altered in the setting of UTI, has been doing well since discharge.  Started getting a bit confused this evening, also complaining of general malaise, acute abdominal pain.  She has a remote history of cancer and learned back in July that it had come back and is currently on immunotherapy.  Review of Systems  A thorough review of systems was obtained and all systems are negative except as noted in the HPI and PMH.   Patient's Health History    Past Medical History:  Diagnosis Date   Anemia    Arthritis    back, hands, hips (02/22/2015)   CAD (coronary artery disease)    a. complex LAD/diagonal bifurcation PCI in 2010. b. STEMI 10/2019 s/p PTCA/DES x1 to mLAD overlapping the old stent, residual disease treated medicaly.   Cancer [facility])    uterine   CHF (congestive heart failure) (HCC)    Depression    Diabetes mellitus (HCC)    started when I was pregnant; not sure if it was type 1 or type 2    History of radiation therapy    Endometrium- HDR 01/22/22-03/07/22- Dr. Lynwood Nasuti   Hypercholesterolemia    Hypertension    Morbid obesity (HCC)    Myocardial infarction (HCC)    mild x 3   Neuromuscular disorder (HCC)    neuropathy feet   Sleep apnea    cpap/    Past Surgical History:  Procedure Laterality Date   CARDIAC CATHETERIZATION  12/02/2012   CHOLECYSTECTOMY OPEN  09/04/1987   CORONARY ANGIOPLASTY WITH STENT PLACEMENT  09/03/2009   CORONARY/GRAFT ACUTE MI REVASCULARIZATION N/A 10/26/2019   Procedure: CORONARY/GRAFT ACUTE MI REVASCULARIZATION;  Surgeon:  Verlin Lonni BIRCH, MD;  Location: [facility] INVASIVE CV LAB;  Service: Cardiovascular;  Laterality: N/A;   INCISIONAL HERNIA REPAIR N/A 12/09/2021   Procedure: LAPAROSCOPIC REPAIR UMBILICAL HERNIA WITH MESH;  Surgeon: Vernetta Berg, MD;  Location: [facility] ORS;  Service: General;  Laterality: N/A;   IR IMAGING GUIDED PORT INSERTION  04/08/2024   LEFT HEART CATH AND CORONARY ANGIOGRAPHY N/A 10/26/2019   Procedure: LEFT HEART CATH AND CORONARY ANGIOGRAPHY;  Surgeon: Verlin Lonni BIRCH, MD;  Location: [facility] INVASIVE CV LAB;  Service: Cardiovascular;  Laterality: N/A;   LEFT HEART CATHETERIZATION WITH CORONARY ANGIOGRAM N/A 12/25/2012   Procedure: LEFT HEART CATHETERIZATION WITH CORONARY ANGIOGRAM;  Surgeon: Ozell Fell, MD;  Location: [facility] CATH LAB;  Service: Cardiovascular;  Laterality: N/A;   RADIOLOGY WITH ANESTHESIA N/A 04/21/2024   Procedure: MRI WITH ANESTHESIA;  Surgeon: Radiologist, Medication, MD;  Location: [facility] OR;  Service: Radiology;  Laterality: N/A;  BRAIN W/ W/O   ROBOTIC ASSISTED TOTAL HYSTERECTOMY WITH BILATERAL SALPINGO OOPHERECTOMY N/A 12/06/2021   Procedure: XI ROBOTIC ASSISTED TOTAL HYSTERECTOMY WITH BILATERAL SALPINGO OOPHORECTOMY;  Surgeon: [name] SAUNDERS, MD;  Location: [facility] ORS;  Service: Gynecology;  Laterality: N/A;   UMBILICAL HERNIA REPAIR  05/04/1989   doesn't think they used mesh    Family History  Problem Relation Age of Onset   Coronary artery disease Mother        in her mid  60s   Hypertension Mother    Cancer Mother        skin   Heart attack Father        in his mid 77s   COPD Father    Stroke Sister    Coronary artery disease Sister    Diabetes Sister    Other Half-Sister    Thyroid  cancer Daughter    Prostate cancer Neg Hx    Colon cancer Neg Hx    Breast cancer Neg Hx    Endometrial cancer Neg Hx    Ovarian cancer Neg Hx    Pancreatic cancer Neg Hx     Social History   Socioeconomic History   Marital status: Married    Spouse name: Not on file    Number of children: 2   Years of education: Not on file   Highest education level: Not on file  Occupational History   Occupation: Housewife  Tobacco Use   Smoking status: Former    Current packs/day: 0.00    Average packs/day: 0.5 packs/day for 1 year (0.5 ttl pk-yrs)    Types: Cigarettes, Cigars    Start date: 09/03/1985    Quit date: 09/03/1986    Years since quitting: 37.6   Smokeless tobacco: Never  Vaping Use   Vaping status: Never Used  Substance and Sexual Activity   Alcohol use: No   Drug use: No   Sexual activity: Not Currently  Other Topics Concern   Not on file  Social History Narrative   Not on file   Social Drivers of Health   Financial Resource Strain: High Risk (02/10/2021)   Overall Financial Resource Strain (CARDIA)    Difficulty of Paying Living Expenses: Hard  Food Insecurity: Food Insecurity Present (04/20/2024)   Hunger Vital Sign    Worried About Running Out of Food in the Last Year: Sometimes true    Ran Out of Food in the Last Year: Never true  Transportation Needs: Patient Unable To Answer (04/20/2024)   PRAPARE - Transportation    Lack of Transportation (Medical): Patient unable to answer    Lack of Transportation (Non-Medical): Patient unable to answer  Physical Activity: Not on file  Stress: Not on file  Social Connections: Not on file  Intimate Partner Violence: Patient Unable To Answer (04/20/2024)   Humiliation, Afraid, Rape, and Kick questionnaire    Fear of Current or Ex-Partner: Patient unable to answer    Emotionally Abused: Patient unable to answer    Physically Abused: Patient unable to answer    Sexually Abused: Patient unable to answer     Physical Exam   Vitals:   05/04/24 0515 05/04/24 0615  BP:    Pulse: 95 99  Resp: (!) 21 (!) 23  Temp:    SpO2: 100% 100%    CONSTITUTIONAL: Chronically ill-appearing, NAD NEURO/PSYCH:  Alert and oriented x 3, no focal deficits EYES:  eyes equal and reactive ENT/NECK:  no LAD, no  JVD CARDIO: Regular rate, well-perfused, normal S1 and S2 PULM:  CTAB no wheezing or rhonchi GI/GU:  non-distended, non-tender MSK/SPINE:  No gross deformities, no edema SKIN:  no rash, atraumatic   *Additional and/or pertinent findings included in MDM below  Diagnostic and Interventional Summary    EKG Interpretation Date/Time:    Ventricular Rate:    PR Interval:    QRS Duration:    QT Interval:    QTC Calculation:   R Axis:      Text Interpretation:  Labs Reviewed  CBC - Abnormal; Notable for the following components:      Result Value   WBC 18.3 (*)    RBC 3.41 (*)    Hemoglobin 9.2 (*)    HCT 28.7 (*)    All other components within normal limits  COMPREHENSIVE METABOLIC PANEL WITH GFR - Abnormal; Notable for the following components:   Sodium 132 (*)    Chloride 96 (*)    Glucose, Bld 147 (*)    Albumin 3.2 (*)    All other components within normal limits  BLOOD GAS, VENOUS - Abnormal; Notable for the following components:   pH, Ven 7.5 (*)    pCO2, Ven 35 (*)    pO2, Ven 57 (*)    Acid-Base Excess 4.2 (*)    All other components within normal limits  CULTURE, BLOOD (ROUTINE X 2)  CULTURE, BLOOD (ROUTINE X 2)  RESPIRATORY PANEL BY PCR  EXPECTORATED SPUTUM ASSESSMENT W GRAM STAIN, RFLX TO RESP C  URINALYSIS, ROUTINE W REFLEX MICROSCOPIC  LEGIONELLA PNEUMOPHILA SEROGP 1 UR AG  STREP PNEUMONIAE URINARY ANTIGEN  COMPREHENSIVE METABOLIC PANEL WITH GFR  CBC  PROCALCITONIN  I-STAT CG4 LACTIC ACID, ED  I-STAT VENOUS BLOOD GAS, ED  TROPONIN T, HIGH SENSITIVITY  TROPONIN T, HIGH SENSITIVITY    CT ABDOMEN PELVIS W CONTRAST  Final Result    DG Chest Port 1 View  Final Result      Medications  morphine  (MS CONTIN ) 12 hr tablet 15 mg (has no administration in time range)  atorvastatin  (LIPITOR ) tablet 80 mg (has no administration in time range)  furosemide  (LASIX ) tablet 40 mg (has no administration in time range)  losartan  (COZAAR ) tablet 25 mg  (has no administration in time range)  nitroGLYCERIN  (NITROSTAT ) SL tablet 0.4 mg (has no administration in time range)  buPROPion  (WELLBUTRIN  XL) 24 hr tablet 150 mg (has no administration in time range)  FLUoxetine  (PROZAC ) capsule 40 mg (has no administration in time range)  prochlorperazine  (COMPAZINE ) tablet 10 mg (has no administration in time range)  prasugrel  (EFFIENT ) tablet 5 mg (has no administration in time range)  pregabalin  (LYRICA ) capsule 50 mg (has no administration in time range)  enoxaparin  (LOVENOX ) injection 40 mg (has no administration in time range)  ipratropium-albuterol  (DUONEB) 0.5-2.5 (3) MG/3ML nebulizer solution 3 mL (has no administration in time range)  sodium chloride  flush (NS) 0.9 % injection 3 mL (has no administration in time range)  sodium chloride  flush (NS) 0.9 % injection 3 mL (has no administration in time range)  sodium chloride  flush (NS) 0.9 % injection 3 mL (has no administration in time range)  0.9 %  sodium chloride  infusion (has no administration in time range)  acetaminophen  (TYLENOL ) tablet 650 mg (has no administration in time range)    Or  acetaminophen  (TYLENOL ) suppository 650 mg (has no administration in time range)  insulin  aspart (novoLOG ) injection 0-6 Units (has no administration in time range)  insulin  aspart (novoLOG ) injection 0-5 Units (has no administration in time range)  insulin  glargine (LANTUS ) injection 20 Units (has no administration in time range)  vancomycin  (VANCOREADY) IVPB 1750 mg/350 mL (has no administration in time range)  ceFEPIme  (MAXIPIME ) 2 g in sodium chloride  0.9 % 100 mL IVPB (has no administration in time range)  vancomycin  (VANCOREADY) IVPB 1250 mg/250 mL (has no administration in time range)  guaiFENesin  (MUCINEX ) 12 hr tablet 600 mg (has no administration in time range)  senna-docusate (Senokot-S) tablet 1-2 tablet (has no  administration in time range)  sodium chloride  0.9 % bolus 500 mL (500 mLs  Intravenous New Bag/Given 05/04/24 0149)  HYDROmorphone  (DILAUDID ) injection 1 mg (1 mg Intravenous Given 05/04/24 0150)  ondansetron  (ZOFRAN ) injection 4 mg (4 mg Intravenous Given 05/04/24 0150)  iohexol  (OMNIPAQUE ) 300 MG/ML solution 100 mL (100 mLs Intravenous Contrast Given 05/04/24 0304)  HYDROmorphone  (DILAUDID ) injection 1 mg (1 mg Intravenous Given 05/04/24 0432)  cefTRIAXone  (ROCEPHIN ) 2 g in sodium chloride  0.9 % 100 mL IVPB (2 g Intravenous New Bag/Given 05/04/24 0432)  azithromycin  (ZITHROMAX ) 500 mg in sodium chloride  0.9 % 250 mL IVPB (500 mg Intravenous New Bag/Given 05/04/24 0507)     Procedures  /  Critical Care Procedures  ED Course and Medical Decision Making  Initial Impression and Ddx Differential diagnosis includes UTI, intra-abdominal complication of known metastatic cancer, electrolyte disturbance  Past medical/surgical history that increases complexity of ED encounter: Metastatic cancer  Interpretation of Diagnostics I personally reviewed the Chest Xray and my interpretation is as follows: No obvious lobar opacity or pneumothorax  No significant blood count or electrolyte disturbance.  Leukocytosis similar to prior.  Patient Reassessment and Ultimate Disposition/Management     CT showing possible pneumonia, also significant worsening of tumor burden.  Admitted to medicine.  Patient management required discussion with the following services or consulting groups:  Hospitalist Service  Complexity of Problems Addressed Acute illness or injury that poses threat of life of bodily function  Additional Data Reviewed and Analyzed Further history obtained from: Further history from spouse/family member  Additional Factors Impacting ED Encounter Risk Consideration of hospitalization  Ozell HERO. Theadore, MD [facility] Emergency Medicine [facility] [email or web address]  Final Clinical Impressions(s) / ED Diagnoses     ICD-10-CM   1. Community acquired  pneumonia, unspecified laterality  J18.9     2. Abdominal pain, unspecified abdominal location  R10.9     3. Metastatic malignant neoplasm, unspecified site [facility])  C79.9       ED Discharge Orders     None        Discharge Instructions Discussed with and Provided to Patient:   Discharge Instructions   None      Theadore Ozell HERO, MD 05/04/24 [phone]

## 2024-05-04 NOTE — H&P (Addendum)
 History and Physical    Brenda [name] FMW:[id number] DOB: [birth year] DOA: 05/04/2024  PCP: Lonn Hicks, MD   Patient coming from: Home   Chief Complaint:  Chief Complaint  Patient presents with   Altered Mental Status   ED TRIAGE note:  Patient brought in by [facility]. Per medics patient is coming from home with c/o from family that patient is acting like she has UTI. Finished abx for UTI last week. BP 130/72 HR 100 end tidal 17. Patient wears 2L [place] at home baseline per patient but when medic arrived she was not on any 02.             HPI:  Brenda [name] is a 63 y.o. 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 Patient also complaining about abdominal pain and back pain.  In the ED patient is alert oriented not confused. Patient reported nonproductive cough.  Denies any fever, chills, abdominal pain, nausea vomiting.  Reported orthopnea  and she uses 2 L oxygen  at baseline which improved the shortness of breath.  Patient was recently hospitalized and discharged on 8/22 for acute metabolic encephalopathy in the setting of polypharmacy, DKA, delirium, and UTI.   ED Course:  At presentation to ED patient is hemodynamically stable. CBC showing chronic leukocytosis 18.3, stable H&H 9.228, and normal platelet count. CMP showing low sodium  of 132, normal blood glucose 147, low albumin 3.2. Pending troponin level.  Pending VBG.  Lactic acid within normal range. Pending UA.  CT abdomen pelvis: 1. Very large and progressed since July Left abdominal tumor occupying and invading the lumbar paraspinal, retroperitoneal, para-aortic, and pararenal spaces. Tumor is now up to 9.5 cm transaxial, directly involves the left renal vessels and abuts the aorta - which remain patent. Early if any invasion into the adjacent L2 and L3 lumbar vertebrae.  2. Confluent new Left lower lobe posterior basal segment peribronchial consolidation, more suspicious for Pneumonia or Aspiration than metastatic disease. Small new layering left pleural effusion.  3. Stable left external iliac lymphadenopathy. No brand new metastasis is identified in the abdomen or pelvis.   Chest x-ray no active disease process.  As CT abdomen pelvis showing partially left lower lobe consolidation in the ED patient has been treated with ceftriaxone  and azithromycin  for pneumonia.  Also 500 mL of LR bolus.  CT abdomen pelvis showing progression of the left abdominal tumor burden invading L2-L3 and close proximity to renal blood vessel and aorta-which still patent.  Need further goal of discussion care with patient and family and palliative care team involvement. Hospitalist has been consulted for further evaluation management of pneumonia and goal of care discussion with the family with palliative care consult.   Significant labs in the ED: Lab Orders         Culture, blood (Routine X 2) w Reflex to ID Panel         Respiratory (~20 pathogens) panel by PCR         Expectorated  Sputum Assessment w Gram Stain, Rflx to Resp Cult         CBC         Comprehensive metabolic panel         Urinalysis, Routine w reflex microscopic -Urine, Clean Catch         Blood gas, venous (at [facility] and [facility])         Legionella Pneumophila Serogp 1 Ur Ag         Strep pneumoniae urinary  antigen         Comprehensive metabolic panel         CBC         Procalcitonin         I-Stat venous blood gas, ([facility] ED, [facility], [facility])       Review of Systems:  Review of Systems  Constitutional:  Negative for chills, fever and weight loss.  Respiratory:  Positive for cough and shortness of breath. Negative for sputum production and wheezing.   Gastrointestinal:  Negative for abdominal pain, blood in stool, constipation, diarrhea, heartburn, melena, nausea and vomiting.  Musculoskeletal:  Positive for back pain. Negative for falls, joint pain, myalgias and neck pain.  Neurological:  Negative for dizziness and headaches.  Psychiatric/Behavioral:  The patient is not nervous/anxious.     Past Medical History:  Diagnosis Date   Anemia    Arthritis    back, hands, hips (02/22/2015)   CAD (coronary artery disease)    a. complex LAD/diagonal bifurcation PCI in 2010. b. STEMI 10/2019 s/p PTCA/DES x1 to mLAD overlapping the old stent, residual disease treated medicaly.   Cancer [facility])    uterine   CHF (congestive heart failure) (HCC)    Depression    Diabetes mellitus (HCC)    started when I was pregnant; not sure if it was type 1 or type 2    History of radiation therapy    Endometrium- HDR 01/22/22-03/07/22- Dr. Lynwood Nasuti   Hypercholesterolemia    Hypertension    Morbid obesity (HCC)    Myocardial infarction (HCC)    mild x 3   Neuromuscular disorder (HCC)    neuropathy feet   Sleep apnea    cpap/    Past Surgical History:  Procedure Laterality Date   CARDIAC CATHETERIZATION  12/02/2012   CHOLECYSTECTOMY OPEN  09/04/1987   CORONARY ANGIOPLASTY WITH STENT PLACEMENT  09/03/2009   CORONARY/GRAFT ACUTE MI REVASCULARIZATION N/A 10/26/2019   Procedure: CORONARY/GRAFT ACUTE MI REVASCULARIZATION;  Surgeon: Verlin Lonni BIRCH, MD;  Location: [facility] INVASIVE CV LAB;  Service: Cardiovascular;  Laterality: N/A;   INCISIONAL HERNIA REPAIR N/A 12/09/2021   Procedure: LAPAROSCOPIC REPAIR  UMBILICAL HERNIA WITH MESH;  Surgeon: Vernetta Berg, MD;  Location: [facility] ORS;  Service: General;  Laterality: N/A;   IR IMAGING GUIDED PORT INSERTION  04/08/2024   LEFT HEART CATH AND CORONARY ANGIOGRAPHY N/A 10/26/2019   Procedure: LEFT HEART CATH AND CORONARY ANGIOGRAPHY;  Surgeon: Verlin Lonni BIRCH, MD;  Location: [facility];  Service: Cardiovascular;  Laterality: N/A;   LEFT HEART CATHETERIZATION WITH CORONARY ANGIOGRAM N/A 12/25/2012   Procedure: LEFT HEART CATHETERIZATION WITH CORONARY ANGIOGRAM;  Surgeon: Ozell Fell, MD;  Location: [facility] CATH LAB;  Service: Cardiovascular;  Laterality: N/A;   RADIOLOGY WITH ANESTHESIA N/A 04/21/2024   Procedure: MRI WITH ANESTHESIA;  Surgeon: Radiologist, Medication, MD;  Location: [facility] OR;  Service: Radiology;  Laterality: N/A;  BRAIN W/ W/O   ROBOTIC ASSISTED TOTAL HYSTERECTOMY WITH BILATERAL SALPINGO OOPHERECTOMY N/A 12/06/2021  Procedure: XI ROBOTIC ASSISTED TOTAL HYSTERECTOMY WITH BILATERAL SALPINGO OOPHORECTOMY;  Surgeon: [name] SAUNDERS, MD;  Location: [facility] ORS;  Service: Gynecology;  Laterality: N/A;   UMBILICAL HERNIA REPAIR  05/04/1989   doesn't think they used mesh     reports that she quit smoking about 37 years ago. Her smoking use included cigarettes and cigars. She started smoking about 38 years ago. She has a 0.5 pack-year smoking history. She has never used smokeless tobacco. She reports that she does not drink alcohol and does not use drugs.  Allergies  Allergen Reactions   Hydrocodone Shortness Of Breath, Dermatitis and Other (See Comments)    I forget to breathe.   Clopidogrel Rash and Dermatitis    Family History  Problem Relation Age of Onset   Coronary artery disease Mother        in her mid 44s   Hypertension Mother    Cancer Mother        skin   Heart attack Father        in his mid 62s   COPD Father    Stroke Sister    Coronary artery disease Sister    Diabetes Sister    Other Half-Sister    Thyroid   cancer Daughter    Prostate cancer Neg Hx    Colon cancer Neg Hx    Breast cancer Neg Hx    Endometrial cancer Neg Hx    Ovarian cancer Neg Hx    Pancreatic cancer Neg Hx     Prior to Admission medications   Medication Sig Start Date End Date Taking? Authorizing Provider  acetaminophen  (TYLENOL ) 500 MG tablet Take 1-2 tablets (500-1,000 mg total) by mouth every 8 (eight) hours as needed for mild pain (pain score 1-3) or moderate pain (pain score 4-6). 03/30/24 03/30/25  Judeth Trenda BIRCH, MD  atorvastatin  (LIPITOR ) 80 MG tablet Take 1 tablet (80 mg total) by mouth every evening. 10/03/21   Newlin, Enobong, MD  buPROPion  (WELLBUTRIN  XL) 150 MG 24 hr tablet Take 150 mg by mouth in the morning.    [provider]  Cholecalciferol  (VITAMIN D3 SUPER STRENGTH) 50 MCG (2000 UT) CAPS Take 2,000 Units by mouth in the morning.    [provider]  Continuous Glucose Sensor (FREESTYLE LIBRE 3 SENSOR) MISC Inject 1 Device into the skin every 14 (fourteen) days.    [provider]  FIASP  FLEXTOUCH 100 UNIT/ML FlexTouch Pen Inject 5-8 Units into the skin See admin instructions. Inject 5 units into the skin before breakfast & lunch and 8 units before supper/evening meal 12/08/22   [provider]  FLUoxetine  (PROZAC ) 40 MG capsule Take 40 mg by mouth daily. 01/17/23   [provider]  furosemide  (LASIX ) 40 MG tablet Take 1 tablet by mouth once daily 02/05/22   Newlin, Enobong, MD  insulin  degludec (TRESIBA) 100 UNIT/ML FlexTouch Pen Inject 20 Units into the skin every evening.    [provider]  lidocaine -prilocaine  (EMLA ) cream Apply to affected area once 04/16/24   Lonn Hicks, MD  losartan  (COZAAR ) 25 MG tablet Take 1 tablet (25 mg total) by mouth daily. 07/19/23   Cooper, Michael, MD  metFORMIN (GLUCOPHAGE-XR) 500 MG 24 hr tablet Take 1,000 mg by mouth in the morning and at bedtime. 07/05/22   [provider]  Misc Natural Products (NEURIVA PO) Take 1  tablet by mouth daily.    [provider]  Misc. Devices MISC Portable oxygen  concentrator, 2L oxygen .  Diagnoses-chronic respiratory  failure with hypoxia 11/27/21   Delbert Clam, MD  morphine  (MS CONTIN ) 15 MG 12 hr tablet Take 1 tablet (15 mg total) by mouth at bedtime. 04/16/24   Lonn Hicks, MD  nitroGLYCERIN  (NITROSTAT ) 0.4 MG SL tablet Dissolve 1 tablet under the tongue every 5 minutes as needed for chest pain. Max of 3 doses, then 911. 01/25/23   Lelon Hamilton T, PA-C  ondansetron  (ZOFRAN ) 8 MG tablet Take 1 tablet (8 mg total) by mouth every 8 (eight) hours as needed for nausea or vomiting. Start on the third day after carboplatin . 04/16/24   Lonn Hicks, MD  OXYGEN  Inhale 2 L/min into the lungs continuous.    [provider]  polyethylene glycol powder (GLYCOLAX /MIRALAX ) 17 GM/SCOOP powder Take 17 g by mouth daily as needed for mild constipation. 09/17/23   [provider]  prasugrel  (EFFIENT ) 5 MG TABS tablet Take 1 tablet (5 mg total) by mouth in the morning. AFTER TAKING YOUR DOSE ON 04/07/2024, STOP TAKING IT IN PREPARATION FOR THE BIOPSY ON 04/15/2024. AFTER THE BIOPSY, FOLLOW THE RADIOLOGIST RECOMMENDATIONS AS TO TIMING OF RESTARTING THIS MEDICINE. 03/30/24   Judeth Trenda BIRCH, MD  pregabalin  (LYRICA ) 50 MG capsule Take 1 capsule (50 mg total) by mouth 2 (two) times daily. 04/24/24   Gonfa, Taye T, MD  prochlorperazine  (COMPAZINE ) 10 MG tablet Take 1 tablet (10 mg total) by mouth every 6 (six) hours as needed for nausea or vomiting. 04/16/24   Lonn Hicks, MD  Respiratory Therapy Supplies (CARETOUCH 2 CPAP HOSE HANGER) MISC daily at night    [provider]  senna-docusate (SENOKOT-S) 8.6-50 MG tablet Take 1-2 tablets by mouth 2 (two) times daily between meals as needed for mild constipation or moderate constipation. 04/24/24   Kathrin Mignon DASEN, MD     Physical Exam: Vitals:   05/04/24 0022 05/04/24 0330 05/04/24 0343  BP: (!) 140/61  (!) 140/62  Pulse: (!)  101 95 95  Resp: 20 (!) 30 (!) 30  Temp: 97.8 F (36.6 C)  98.8 F (37.1 C)  SpO2: 98% 99% 99%    Physical Exam Constitutional:      General: She is not in acute distress.    Appearance: Normal appearance. She is obese. She is ill-appearing.  HENT:     Mouth/Throat:     Mouth: Mucous membranes are moist.  Eyes:     Pupils: Pupils are equal, round, and reactive to light.  Cardiovascular:     Rate and Rhythm: Normal rate and regular rhythm.     Pulses: Normal pulses.     Heart sounds: Normal heart sounds.  Pulmonary:     Effort: Pulmonary effort is normal.     Breath sounds: Normal breath sounds.  Abdominal:     General: There is no distension.     Palpations: Abdomen is soft.     Tenderness: There is no abdominal tenderness.  Musculoskeletal:     Cervical back: Neck supple.     Right lower leg: No edema.     Left lower leg: No edema.  Skin:    General: Skin is warm.     Capillary Refill: Capillary refill takes less than 2 seconds.  Neurological:     Mental Status: She is alert and oriented to person, place, and time.  Psychiatric:        Mood and Affect: Mood normal.      Labs on Admission: I have personally reviewed following labs and imaging studies  CBC: Recent  Labs  Lab 04/27/24 1438 05/04/24 0132  WBC 18.3* 18.3*  NEUTROABS 15.4*  --   HGB 9.1* 9.2*  HCT 28.1* 28.7*  MCV 82.6 84.2  PLT 379 270   Basic Metabolic Panel: Recent Labs  Lab 04/27/24 1438 05/04/24 0132  NA 134* 132*  K 3.8 4.0  CL 99 96*  CO2 28 22  GLUCOSE 267* 147*  BUN 9 11  CREATININE 0.54 0.48  CALCIUM  8.4* 9.4   GFR: Estimated Creatinine Clearance: 75.4 mL/min (by C-G formula based on SCr of 0.48 mg/dL). Liver Function Tests: Recent Labs  Lab 04/27/24 1438 05/04/24 0132  AST 9* 23  ALT 6 6  ALKPHOS 96 105  BILITOT 0.3 0.8  PROT 6.3* 6.5  ALBUMIN 3.0* 3.2*   No results for input(s): LIPASE, AMYLASE in the last 168 hours. No results for input(s): AMMONIA in  the last 168 hours. Coagulation Profile: No results for input(s): INR, PROTIME in the last 168 hours. Cardiac Enzymes: No results for input(s): CKTOTAL, CKMB, CKMBINDEX, TROPONINI, TROPONINIHS in the last 168 hours. BNP (last 3 results) No results for input(s): BNP in the last 8760 hours. HbA1C: No results for input(s): HGBA1C in the last 72 hours. CBG: No results for input(s): GLUCAP in the last 168 hours. Lipid Profile: No results for input(s): CHOL, HDL, LDLCALC, TRIG, CHOLHDL, LDLDIRECT in the last 72 hours. Thyroid  Function Tests: No results for input(s): TSH, T4TOTAL, FREET4, T3FREE, THYROIDAB in the last 72 hours. Anemia Panel: No results for input(s): VITAMINB12, FOLATE, FERRITIN, TIBC, IRON, RETICCTPCT in the last 72 hours. Urine analysis:    Component Value Date/Time   COLORURINE YELLOW 04/20/2024 1851   APPEARANCEUR CLEAR 04/20/2024 1851   LABSPEC 1.036 (H) 04/20/2024 1851   PHURINE 6.0 04/20/2024 1851   GLUCOSEU 50 (A) 04/20/2024 1851   HGBUR SMALL (A) 04/20/2024 1851   BILIRUBINUR NEGATIVE 04/20/2024 1851   BILIRUBINUR neg 08/16/2016 1147   KETONESUR 80 (A) 04/20/2024 1851   PROTEINUR NEGATIVE 04/20/2024 1851   UROBILINOGEN 2.0 08/16/2016 1147   UROBILINOGEN 2.0 (H) 02/22/2015 2143   NITRITE NEGATIVE 04/20/2024 1851   LEUKOCYTESUR NEGATIVE 04/20/2024 1851    Radiological Exams on Admission: I have personally reviewed images CT ABDOMEN PELVIS W CONTRAST Result Date: 05/04/2024 CLINICAL DATA:  63 year old female with abdominal pain. Metastatic endometrial cancer. * Tracking Code: BO * EXAM: CT ABDOMEN AND PELVIS WITH CONTRAST TECHNIQUE: Multidetector CT imaging of the abdomen and pelvis was performed using the standard protocol following bolus administration of intravenous contrast. RADIATION DOSE REDUCTION: This exam was performed according to the departmental dose-optimization program which includes automated  exposure control, adjustment of the mA and/or kV according to patient size and/or use of iterative reconstruction technique. CONTRAST:  OMNIPAQUE  IOHEXOL  300 MG/ML  SOLN COMPARISON:  CT Abdomen and Pelvis 03/29/2024. FINDINGS: Lower chest: Stable elevation of the right hemidiaphragm. Borderline to mild cardiomegaly is stable. No pericardial effusion. Trace layering left pleural effusion is new since July. And there is confluent new posterior basal segment left lower lobe peribronchial airspace opacity with air bronchograms (series 6, image 51). The visible airways remain patent. No suspicious lung base nodule or discrete mass. Hepatobiliary: Chronically absent gallbladder and stable prominence of intra- and extrahepatic biliary ducts since July. No discrete liver mass. Pancreas: Stable atrophy. Spleen: Stable and negative. Adrenals/Urinary Tract: Mass effect on the left kidney from bulky, which left abdominal retroperitoneal, paraspinal, pararenal space mass is further detailed below. Invasion of the left pararenal space as before. Involvement  of the left main renal vessels also (series 2, image 38). But renal enhancement remain symmetric. Renal contrast excretion is symmetric. The left ureter is located along the anterior margin of the large tumor and is nondilated. Adrenal glands remain within normal limits. Bladder is negative. Stomach/Bowel: Nondilated large and small bowel. Redundant sigmoid colon. Mild large bowel retained stool. Cecum partially located in the pelvis. Decompressed terminal ileum. Normal appendix on series 2, image 73. Small fat containing ventral abdominal hernia near the midline on series 2, image 67 is stable. No herniated bowel. Stomach and duodenum appear negative. No pneumoperitoneum. No free fluid. Vascular/Lymphatic: Large and heterogeneously enhancing nearly 10 cm left abdominal retroperitoneal, paraspinal, pararenal space mass. This may be severe ex nodal tumor, and has  progressed since July which is further detailed below. That tumor is inseparable from the infrarenal abdominal aorta which remains patent on series 2, image 44. Tumor extends to the aortoiliac bifurcation which is patent. Underlying mild for age Aortoiliac calcified atherosclerosis. More advanced calcified femoral artery atherosclerosis. Major arterial branches remain patent. Portal venous system and IVC also appear patent. Left external iliac lymphadenopathy is stable measuring up to 18 mm short axis. No other lymphadenopathy. Reproductive: Surgically absent. Other: No pelvis free fluid. 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 But subtle if any interval erosion into the adjacent L2 and L3 vertebral bodies (perhaps on series 2, image 41). No discrete bone metastasis by CT. Body wall edema not significantly changed since July. IMPRESSION: 1. Very large and progressed since July Left abdominal tumor occupying and invading the lumbar paraspinal, retroperitoneal, para-aortic, and pararenal spaces. Tumor is now up to 9.5 cm transaxial, directly involves the left renal vessels and abuts the aorta - which remain patent. Early if any invasion into the adjacent L2 and L3 lumbar vertebrae. 2. Confluent new Left lower lobe posterior basal segment peribronchial consolidation, more suspicious for Pneumonia or Aspiration than metastatic disease. Small new layering left pleural effusion. 3. Stable left external iliac lymphadenopathy. No brand new metastasis is identified in the abdomen or pelvis. Electronically Signed   By: VEAR Hurst M.D.   On: 05/04/2024 04:04   DG Chest Port 1 View Result Date: 05/04/2024 CLINICAL DATA:  Dyspnea EXAM: PORTABLE CHEST 1 VIEW COMPARISON:  04/20/2024 FINDINGS: Stable  elevation of the right hemidiaphragm. Lungs are clear. No pneumothorax or pleural effusion. Right internal jugular chest port tip seen within the superior cavoatrial junction. Cardiac size within limits. Pulmonary vascularity is normal. No acute bone abnormality. IMPRESSION: 1. No active disease. Electronically Signed   By: Dorethia Molt M.D.   On: 05/04/2024 02:47     EKG: My personal interpretation of EKG shows: Normal sinus rhythm heart rate 97, right bundle branch block.    Assessment/Plan: Principal Problem:   Community acquired pneumonia Active Problems:   Hyperlipidemia   Essential hypertension   Obstructive sleep apnea   Type 2 diabetes mellitus with complication, with long-term current use of insulin  (HCC)   Anxiety with depression   Chronic diastolic CHF (congestive heart failure) (HCC)   Chronic hypoxic respiratory failure (HCC)   Endometrial carcinoma (HCC)   Cancer related pain   Adenocarcinoma of lung (HCC)   History of CAD (coronary artery disease)   CAP (community acquired pneumonia)  Assessment and Plan: Community-acquired pneumonia -Presented to emergency department by EMS as patient's family complaining about patient found confused earlier.  Patient reported nonproductive cough and shortness of breath.  Denies any fever, chills, nerve pain, nausea, vomiting and diarrhea.  Alert oriented x 3. - In the ED patient is hemodynamically stable. -Recent hospital admission 1 week ago. -CBC showing persistent leukocytosis 8.3, stable H&H normal platelet count.  Normal lactic acid level.  Pending respiratory panel - Chest x-ray [place] process - CT abdomen pelvis showed progressive tumor burden since July.  New left lower lobe consolidation pneumonia versus aspiration rather than metastasis.  New left pleural effusion. - Patient has known history of stage IV metastasis endometrial carcinoma and adenocarcinoma of the lung. - In the ED patient has been treated with  azithromycin  and ceftriaxone  -Given patient is immunocompromised and recently admitted in the hospital high risk for MRSA and Pseudomonas associated pneumonia. Starting broad-spectrum antibiotic coverage with IV vancomycin  and cefepime . - Need to follow-up with culture result-blood culture, sputum culture, urine Legionella antigen, urine strep antigen and respiratory panel. -Continue supportive care.  Stage IV metastatic endometrial cancer Adenocarcinoma the lung - Consulted palliative care to discuss further goals of care with patient/family. - Please reach out to oncology and daytime.  Hyponatremia - Serum sodium 132.  Serum sodium was 134 few days ago. Continue to monitor as patient is on Lasix .  Obstructive sleep apnea Chronic hypoxic respiratory failure -Continue CPAP at bedtime. - Continue supplemental oxygen  2 L.  Insulin -dependent DM type II -Continue Lantus  20 unit and sliding scale insulin  with mealtime coverage.  Anxiety depression -Continue Wellbutrin  XL and Prozac .  Chronic diastolic heart failure -Continue Lasix , and losartan .  History of CAD status post DES stent placement -Continue prasugrel  and Lipitor .  Hyperlipidemia -Continue Lipitor   Cancer-related pain - Continue Lyrica  and long-acting morphine  15 mg bid.  DVT prophylaxis:  Lovenox  Code Status:  Full Code Diet: Heart healthy carb modified diet Family Communication:   Family was present at bedside, at the time of interview. Opportunity was given to ask question and all questions were answered satisfactorily.  Disposition Plan: Need to follow-up with troponin, lactic acid, UA, blood culture, sputum culture results.    Please follow-up with palliative care Consults: Palliative team Admission status:   Inpatient, telemetry bed  Severity of Illness: The appropriate patient status for this patient is INPATIENT. Inpatient status is judged to be reasonable and necessary in order to provide the required  intensity of service to ensure the patient's safety. The patient's presenting symptoms, physical exam findings, and initial radiographic and laboratory data in the context of their chronic comorbidities is felt to place them at high risk for further clinical deterioration. Furthermore, it is not anticipated that the patient will be medically stable for discharge from the hospital within 2 midnights of admission.   * I certify that at the point of admission it is my clinical judgment that the patient will require inpatient hospital care spanning beyond 2 midnights from the point of admission due to high intensity of service, high risk for further deterioration and high frequency of surveillance required.DEWAINE    Dorrance Sellick, MD [facility]  How to contact the [facility] Attending or Consulting provider 7A - 7P or covering provider during after hours 7P -7A, for this patient.  Check the care team in [facility] and look for a) attending/consulting [facility] provider listed and b) the [facility] team listed Log into [email or web address] and use [place] universal password to access.  If you do not have the password, please contact the hospital operator. Locate the [facility] provider you are looking for under [facility] and page to a number that you can be directly reached. If you still have difficulty reaching the provider, please page the [facility] (Director on Call) for the Hospitalists listed on amion for assistance.  05/04/2024, 6:02 AM

## 2024-05-04 NOTE — Progress Notes (Signed)
   05/04/24 1000  TOC Brief Assessment  Insurance and Status Reviewed  Patient has primary care physician Yes  Home environment has been reviewed single family home  Prior level of function: independent  Prior/Current Home Services No current home services  Social Drivers of Health Review SDOH reviewed no interventions necessary  Readmission risk has been reviewed Yes  Transition of care needs no transition of care needs at this time    Signed: Heather Saltness, MSW, LCSW Clinical Social Worker Inpatient Care Management 05/04/2024 10:01 AM

## 2024-05-04 NOTE — Progress Notes (Signed)
 Patient just arrived to floor from ED. She is very pleasant.  I will get patient settled and access port. Day RN to complete admission history, assessment, and morning medications. Essa Wenk M

## 2024-05-04 NOTE — ED Triage Notes (Signed)
 Patient brought in by [facility]. Per medics patient is coming from home with c/o from family that patient is acting like she has UTI. Finished abx for UTI last week. BP 130/72 HR 100 end tidal 17. Patient wears 2L [place] at home baseline per patient but when medic arrived she was not on any 02.

## 2024-05-04 NOTE — Progress Notes (Signed)
 Pharmacy Antibiotic Note  Brenda [name] is a 63 y.o. female admitted on 05/04/2024 with pneumonia.  Patient received Ceftriaxone  and Azithromycin  x 1 dose in the ED.   On admission, Pharmacy has been consulted for Vancomycin  and Cefepime  dosing.  Plan: Vancomycin  1750mg  IV x 1 followed by Vancomycin  1250 mg IV Q 24 hrs. Goal AUC 400-550. Expected AUC: 545.3  SCr used: 0.8 (rounded up from 0.48) Cefepime  2g IV q8h Follow renal function F/u culture results & sensitivities    Temp (24hrs), Avg:98.3 F (36.8 C), Min:97.8 F (36.6 C), Max:98.8 F (37.1 C)  Recent Labs  Lab 04/27/24 1438 05/04/24 0132 05/04/24 0204  WBC 18.3* 18.3*  --   CREATININE 0.54 0.48  --   LATICACIDVEN  --   --  1.0    Estimated Creatinine Clearance: 75.4 mL/min (by C-G formula based on SCr of 0.48 mg/dL).    Allergies  Allergen Reactions   Hydrocodone Shortness Of Breath, Dermatitis and Other (See Comments)    I forget to breathe.   Clopidogrel Rash and Dermatitis    Antimicrobials this admission: 9/1 Azithromycin  x 1 9/1 Ceftriaxone  x 1 9/1 Vancomycin  >> 9/1 Cefepime  >>  Dose adjustments this admission:    Microbiology results: 9/1 BCx:      Thank you for allowing pharmacy to be a part of this patient's care.  Kemp Arvin Fletcher, PharmD 05/04/2024 5:12 AM

## 2024-05-04 NOTE — Plan of Care (Signed)
 Patient seen and rounded on 9/1. Admitted after MN, see H&P for full A&P.  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 Known history of developing confusion with infections, most recently a UTI.  Now again presenting with some change in her mentation and appearing more confused prior to admission. CT A/P was performed which showed a large and progressed left-sided abdominal tumor measuring up to 9.5 cm.  Of note, she also has left-sided pains notably back pain for which she is on oral morphine   and oxycodone  at home.  CT also showed left lower lobe consolidation concerning for pneumonia and small left pleural effusion. WBC noted to be elevated on admission.  She was started on antibiotics for pneumonia coverage.  A&P: - Continue vancomycin  and cefepime  in setting of underlying immunosuppression -Follow-up Legionella urinary antigen; negative for strep pneumo and RVP -Initial Pro-Cal negative, continue to trend -Prior plan was for restarting chemo with oncology; Dr. Lonn added to treatment team - further plan per H&P  Alm Apo, MD [facility] 05/04/2024, 3:32 PM

## 2024-05-04 NOTE — Progress Notes (Signed)
 Chaplains received a consult to provide Brenda [name]) with prayer.  I met with Brenda [name] and she shared about her diagnosis, her concerns for her family including her husband, 2 adult daughters and a grandson, as well as a nephew that she is raising who is almost 80.  She wants to make her daughters medical decision makers to lift that burden from her husband.  I reviewed documents with her and let her know that due to droplet precautions, we may not be able to complete notarizing documents while she is inpatient for infection prevention of witnesses. She understood this and I explained how to get it notarized after discharge. She is feeling grief and sadness about the possibility of leaving her family, but her family is having a hard time making space for her grief because they don't want her to give up.  I normalized the grief and sadness even while still holding hope, and gave her time to share what she needed to about that.  I provided prayer at her request as well.

## 2024-05-04 NOTE — ED Notes (Addendum)
 Pt daughter stated pt is normally on 2L of O2 at home at all times. Pt O2 was not on an sats were sitting at 97 without it while this writer was in the room.

## 2024-05-04 NOTE — ED Notes (Signed)
Patient resting in bed with daughter at bedside.

## 2024-05-04 NOTE — ED Notes (Signed)
 ED TO INPATIENT HANDOFF REPORT  Name/Age/Gender Verneita Macario Gin 63 y.o. female  Code Status    Code Status Orders  (From admission, onward)           Start     Ordered   05/04/24 0457  Full code  Continuous       Question:  By:  Answer:  Consent: discussion documented in EHR   05/04/24 0457           Code Status History     Date Active Date Inactive Code Status Order ID Comments User Context   04/20/2024 1507 04/24/2024 2212 Full Code [id number]  Seena Marsa NOVAK, MD ED   04/08/2024 1306 04/09/2024 0517 Full Code [id number]  Vanice Sharper, MD HOV   04/08/2024 1306 04/08/2024 1306 Full Code [id number]  Vanice Sharper, MD HOV   03/29/2024 1314 03/30/2024 2058 Full Code [id number]  Judeth Trenda BIRCH, MD Inpatient   12/08/2021 2144 12/11/2021 1655 Full Code [id number]  Seena Marsa NOVAK, MD ED   12/06/2021 1739 12/07/2021 2030 Full Code [id number]  Rogelio Planas, MD Inpatient   10/19/2021 0133 10/21/2021 1912 Partial Code [id number]  Marlee Lynwood NOVAK, MD ED   10/19/2021 0034 10/19/2021 0133 DNR [id number]  Kriste Berth, DO ED   02/08/2021 1140 02/14/2021 2248 Full Code [id number]  Jama Fonda POUR, MD ED   10/26/2019 1252 10/28/2019 1949 Full Code [id number]  Verlin Lonni BIRCH, MD Inpatient   02/22/2015 1531 02/24/2015 1535 Full Code [id number]  Earley Saucer, MD ED       Home/SNF/Other Home  Chief Complaint CAP (community acquired pneumonia) [J18.9]  Level of Care/Admitting Diagnosis ED Disposition     ED Disposition  Admit   Condition  --   Comment  Hospital Area: [facility] [[id number]]  Level of Care: Telemetry [5]  Admit to tele based on following criteria: Other see comments  Comments: Monitor for arrhythmia  May admit patient to [facility] or [facility] if equivalent level of care is available:: No  Covid Evaluation: Symptomatic Person Under Investigation (PUI) or recent exposure (last 10 days) *Testing Required*  Diagnosis: CAP (community acquired  pneumonia) [659315]  Admitting Physician: SUNDIL, SUBRINA [[id number]]  Attending Physician: SUNDIL, SUBRINA [[id number]]  Certification:: I certify this patient will need inpatient services for at least 2 midnights  Expected Medical Readiness: 05/08/2024          Medical History Past Medical History:  Diagnosis Date   Anemia    Arthritis    back, hands, hips (02/22/2015)   CAD (coronary artery disease)    a. complex LAD/diagonal bifurcation PCI in 2010. b. STEMI 10/2019 s/p PTCA/DES x1 to mLAD overlapping the old stent, residual disease treated medicaly.   Cancer [facility])    uterine   CHF (congestive heart failure) (HCC)    Depression    Diabetes mellitus (HCC)    started when I was pregnant; not sure if it was type 1 or type 2    History of radiation therapy    Endometrium- HDR 01/22/22-03/07/22- Dr. Lynwood Nasuti   Hypercholesterolemia    Hypertension    Morbid obesity (HCC)    Myocardial infarction (HCC)    mild x 3   Neuromuscular disorder (HCC)    neuropathy feet   Sleep apnea    cpap/    Allergies Allergies  Allergen Reactions   Hydrocodone Shortness Of Breath, Dermatitis and Other (See Comments)    I forget to breathe.   Clopidogrel Rash and Dermatitis  IV Location/Drains/Wounds Patient Lines/Drains/Airways Status     Active Line/Drains/Airways     Name Placement date Placement time Site Days   Implanted Port 04/08/24 Right Chest Single Power 04/08/24  1229  Chest  26   Peripheral IV 05/04/24 20 G Right Forearm 05/04/24  --  Forearm  less than 1   Incision - 2 Ports Abdomen 1: Left;Upper 2: Left;Lower 12/09/21  1055  -- 877   Incision - 5 Ports Abdomen 1: Right;Lateral;Distal 2: Right;Lateral 3: Umbilicus;Upper 4: Left;Lateral 5: Left;Lateral;Distal 12/06/21  1329  -- 880   Wound / Incision (Open or Dehisced) 02/12/21 Low transverse cesarean section Anterior 02/12/21  0019  LTCS  1177            Labs/Imaging Results for orders placed or performed  during the hospital encounter of 05/04/24 (from the past 48 hours)  CBC     Status: Abnormal   Collection Time: 05/04/24  1:32 AM  Result Value Ref Range   WBC 18.3 (H) 4.0 - 10.5 K/uL   RBC 3.41 (L) 3.87 - 5.11 MIL/uL   Hemoglobin 9.2 (L) 12.0 - 15.0 g/dL   HCT 71.2 (L) 63.9 - 53.9 %   MCV 84.2 80.0 - 100.0 fL   MCH 27.0 26.0 - 34.0 pg   MCHC 32.1 30.0 - 36.0 g/dL   RDW 84.8 88.4 - 84.4 %   Platelets 270 150 - 400 K/uL   nRBC 0.0 0.0 - 0.2 %    Comment: Performed at [facility], [address], [place], [place] [address]  Comprehensive metabolic panel     Status: Abnormal   Collection Time: 05/04/24  1:32 AM  Result Value Ref Range   Sodium 132 (L) 135 - 145 mmol/L   Potassium 4.0 3.5 - 5.1 mmol/L   Chloride 96 (L) 98 - 111 mmol/L   CO2 22 22 - 32 mmol/L   Glucose, Bld 147 (H) 70 - 99 mg/dL    Comment: Glucose reference range applies only to samples taken after fasting for at least 8 hours.   BUN 11 8 - 23 mg/dL   Creatinine, Ser 9.51 0.44 - 1.00 mg/dL   Calcium  9.4 8.9 - 10.3 mg/dL   Total Protein 6.5 6.5 - 8.1 g/dL   Albumin 3.2 (L) 3.5 - 5.0 g/dL   AST 23 15 - 41 U/L   ALT 6 0 - 44 U/L   Alkaline Phosphatase 105 38 - 126 U/L   Total Bilirubin 0.8 0.0 - 1.2 mg/dL   GFR, Estimated >39 >39 mL/min    Comment: (NOTE) Calculated using the CKD-EPI Creatinine Equation (2021)    Anion gap 14 5 - 15    Comment: Performed at [facility], [address] [place], [place] [address]  I-Stat CG4 Lactic Acid     Status: None   Collection Time: 05/04/24  2:04 AM  Result Value Ref Range   Lactic Acid, Venous 1.0 0.5 - 1.9 mmol/L  Blood gas, venous (at [facility] and [facility])     Status: Abnormal   Collection Time: 05/04/24  2:40 AM  Result Value Ref Range   pH, Ven 7.5 (H) 7.25 - 7.43   pCO2, Ven 35 (L) 44 - 60 mmHg   pO2, Ven 57 (H) 32 - 45 mmHg   Bicarbonate 27.3 20.0 - 28.0 mmol/L   Acid-Base Excess 4.2 (H) 0.0 - 2.0 mmol/L   O2 Saturation 90.6 %    Patient temperature 37.0  Comment: Performed at [facility], [address], [place], [place] [address]   CT ABDOMEN PELVIS W CONTRAST Result Date: 05/04/2024 CLINICAL DATA:  63 year old female with abdominal pain. Metastatic endometrial cancer. * Tracking Code: BO * EXAM: CT ABDOMEN AND PELVIS WITH CONTRAST TECHNIQUE: Multidetector CT imaging of the abdomen and pelvis was performed using the standard protocol following bolus administration of intravenous contrast. RADIATION DOSE REDUCTION: This exam was performed according to the departmental dose-optimization program which includes automated exposure control, adjustment of the mA and/or kV according to patient size and/or use of iterative reconstruction technique. CONTRAST:  OMNIPAQUE  IOHEXOL  300 MG/ML  SOLN COMPARISON:  CT Abdomen and Pelvis 03/29/2024. FINDINGS: Lower chest: Stable elevation of the right hemidiaphragm. Borderline to mild cardiomegaly is stable. No pericardial effusion. Trace layering left pleural effusion is new since July. And there is confluent new posterior basal segment left lower lobe peribronchial airspace opacity with air bronchograms (series 6, image 51). The visible airways remain patent. No suspicious lung base nodule or discrete mass. Hepatobiliary: Chronically absent gallbladder and stable prominence of intra- and extrahepatic biliary ducts since July. No discrete liver mass. Pancreas: Stable atrophy. Spleen: Stable and negative. Adrenals/Urinary Tract: Mass effect on the left kidney from bulky, which left abdominal retroperitoneal, paraspinal, pararenal space mass is further detailed below. Invasion of the left pararenal space as before. Involvement of the left main renal vessels also (series 2, image 38). But renal enhancement remain symmetric. Renal contrast excretion is symmetric. The left ureter is located along the anterior margin of the large tumor and is nondilated. Adrenal glands remain within  normal limits. Bladder is negative. Stomach/Bowel: Nondilated large and small bowel. Redundant sigmoid colon. Mild large bowel retained stool. Cecum partially located in the pelvis. Decompressed terminal ileum. Normal appendix on series 2, image 73. Small fat containing ventral abdominal hernia near the midline on series 2, image 67 is stable. No herniated bowel. Stomach and duodenum appear negative. No pneumoperitoneum. No free fluid. Vascular/Lymphatic: Large and heterogeneously enhancing nearly 10 cm left abdominal retroperitoneal, paraspinal, pararenal space mass. This may be severe ex nodal tumor, and has progressed since July which is further detailed below. That tumor is inseparable from the infrarenal abdominal aorta which remains patent on series 2, image 44. Tumor extends to the aortoiliac bifurcation which is patent. Underlying mild for age Aortoiliac calcified atherosclerosis. More advanced calcified femoral artery atherosclerosis. Major arterial branches remain patent. Portal venous system and IVC also appear patent. Left external iliac lymphadenopathy is stable measuring up to 18 mm short axis. No other lymphadenopathy. Reproductive: Surgically absent. Other: No pelvis free fluid. 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 But subtle if any interval erosion into the adjacent L2 and L3 vertebral bodies (perhaps on series 2, image 41). No discrete bone metastasis by CT. Body wall edema not significantly changed since July. IMPRESSION: 1. Very large and progressed since July Left abdominal tumor occupying and invading the lumbar paraspinal, retroperitoneal, para-aortic, and pararenal spaces. Tumor is now up to 9.5 cm transaxial, directly involves the left renal vessels and abuts the  aorta - which remain patent. Early if any invasion into the adjacent L2 and L3 lumbar vertebrae. 2. Confluent new Left lower lobe posterior basal segment peribronchial consolidation, more suspicious for Pneumonia or  Aspiration than metastatic disease. Small new layering left pleural effusion. 3. Stable left external iliac lymphadenopathy. No brand new metastasis is identified in the abdomen or pelvis. Electronically Signed   By: VEAR Hurst M.D.   On: 05/04/2024 04:04   DG Chest Port 1 View Result Date: 05/04/2024 CLINICAL DATA:  Dyspnea EXAM: PORTABLE CHEST 1 VIEW COMPARISON:  04/20/2024 FINDINGS: Stable elevation of the right hemidiaphragm. Lungs are clear. No pneumothorax or pleural effusion. Right internal jugular chest port tip seen within the superior cavoatrial junction. Cardiac size within limits. Pulmonary vascularity is normal. No acute bone abnormality. IMPRESSION: 1. No active disease. Electronically Signed   By: Dorethia Molt M.D.   On: 05/04/2024 02:47    Pending Labs Unresulted Labs (From admission, onward)     Start     Ordered   05/04/24 0500  Comprehensive metabolic panel  Tomorrow morning,   R        05/04/24 0457   05/04/24 0500  CBC  Tomorrow morning,   R        05/04/24 0457   05/04/24 0458  Expectorated Sputum Assessment w Gram Stain, Rflx to Resp Cult  (COPD / Pneumonia / Cellulitis / Lower Extremity Wound (Diabetic Foot Infection))  Once,   R        05/04/24 0457   05/04/24 0458  Legionella Pneumophila Serogp 1 Ur Ag  (COPD / Pneumonia / Cellulitis / Lower Extremity Wound (Diabetic Foot Infection))  Once,   R        05/04/24 0457   05/04/24 0458  Strep pneumoniae urinary antigen  (COPD / Pneumonia / Cellulitis / Lower Extremity Wound (Diabetic Foot Infection))  Once,   R        05/04/24 0457   05/04/24 0458  Procalcitonin  (COPD / Pneumonia / Cellulitis / Lower Extremity Wound (Diabetic Foot Infection))  Add-on,   AD       References:    Procalcitonin Lower Respiratory Tract  Infection AND Sepsis Procalcitonin Algorithm   05/04/24 0457   05/04/24 0448  Respiratory (~20 pathogens) panel by PCR  (Respiratory panel by PCR (~20 pathogens, ~24 hr TAT)  w precautions)  Once,   URGENT       Question Answer Comment  Patient immune status Immunocompromised   Release to patient Immediate      05/04/24 0447   05/04/24 0447  Culture, blood (Routine X 2) w Reflex to ID Panel  BLOOD CULTURE X 2,   R (with STAT occurrences)     Question Answer Comment  Patient immune status Immunocompromised   Release to patient Immediate      05/04/24 0447   05/04/24 0056  Urinalysis, Routine w reflex microscopic -Urine, Clean Catch  Once,   URGENT       Question:  Specimen Source  Answer:  Urine, Clean Catch   05/04/24 0055            Vitals/Pain Today's Vitals   05/04/24 0150 05/04/24 0330 05/04/24 0343 05/04/24 0432  BP:   (!) 140/62   Pulse:  95 95   Resp:  (!) 30 (!) 30   Temp:   98.8 F (37.1 C)   SpO2:  99% 99%   PainSc: 9    9     Isolation Precautions Droplet precaution  Medications Medications  azithromycin  (ZITHROMAX ) 500 mg in sodium chloride  0.9 % 250 mL IVPB (500 mg Intravenous New Bag/Given 05/04/24 0507)  morphine  (MS CONTIN ) 12 hr tablet 15  mg (has no administration in time range)  atorvastatin  (LIPITOR ) tablet 80 mg (has no administration in time range)  furosemide  (LASIX ) tablet 40 mg (has no administration in time range)  losartan  (COZAAR ) tablet 25 mg (has no administration in time range)  nitroGLYCERIN  (NITROSTAT ) SL tablet 0.4 mg (has no administration in time range)  buPROPion  (WELLBUTRIN  XL) 24 hr tablet 150 mg (has no administration in time range)  FLUoxetine  (PROZAC ) capsule 40 mg (has no administration in time range)  prochlorperazine  (COMPAZINE ) tablet 10 mg (has no administration in time range)  senna-docusate (Senokot-S) tablet 1-2 tablet (has no administration in time range)  prasugrel  (EFFIENT ) tablet 5 mg (has no administration in time  range)  pregabalin  (LYRICA ) capsule 50 mg (has no administration in time range)  enoxaparin  (LOVENOX ) injection 40 mg (has no administration in time range)  ipratropium-albuterol  (DUONEB) 0.5-2.5 (3) MG/3ML nebulizer solution 3 mL (has no administration in time range)  sodium chloride  flush (NS) 0.9 % injection 3 mL (has no administration in time range)  sodium chloride  flush (NS) 0.9 % injection 3 mL (has no administration in time range)  sodium chloride  flush (NS) 0.9 % injection 3 mL (has no administration in time range)  0.9 %  sodium chloride  infusion (has no administration in time range)  acetaminophen  (TYLENOL ) tablet 650 mg (has no administration in time range)    Or  acetaminophen  (TYLENOL ) suppository 650 mg (has no administration in time range)  insulin  aspart (novoLOG ) injection 0-6 Units (has no administration in time range)  insulin  aspart (novoLOG ) injection 0-5 Units (has no administration in time range)  insulin  glargine (LANTUS ) injection 20 Units (has no administration in time range)  sodium chloride  0.9 % bolus 500 mL (500 mLs Intravenous New Bag/Given 05/04/24 0149)  HYDROmorphone  (DILAUDID ) injection 1 mg (1 mg Intravenous Given 05/04/24 0150)  ondansetron  (ZOFRAN ) injection 4 mg (4 mg Intravenous Given 05/04/24 0150)  iohexol  (OMNIPAQUE ) 300 MG/ML solution 100 mL (100 mLs Intravenous Contrast Given 05/04/24 0304)  HYDROmorphone  (DILAUDID ) injection 1 mg (1 mg Intravenous Given 05/04/24 0432)  cefTRIAXone  (ROCEPHIN ) 2 g in sodium chloride  0.9 % 100 mL IVPB (2 g Intravenous New Bag/Given 05/04/24 0432)    Mobility non-ambulatory

## 2024-05-05 ENCOUNTER — Encounter: Payer: Self-pay | Admitting: Hematology and Oncology

## 2024-05-05 DIAGNOSIS — Z7189 Other specified counseling: Secondary | ICD-10-CM

## 2024-05-05 DIAGNOSIS — G893 Neoplasm related pain (acute) (chronic): Secondary | ICD-10-CM | POA: Diagnosis not present

## 2024-05-05 DIAGNOSIS — Z515 Encounter for palliative care: Secondary | ICD-10-CM

## 2024-05-05 DIAGNOSIS — C799 Secondary malignant neoplasm of unspecified site: Secondary | ICD-10-CM | POA: Diagnosis not present

## 2024-05-05 DIAGNOSIS — F418 Other specified anxiety disorders: Secondary | ICD-10-CM | POA: Diagnosis not present

## 2024-05-05 DIAGNOSIS — J189 Pneumonia, unspecified organism: Secondary | ICD-10-CM | POA: Diagnosis not present

## 2024-05-05 DIAGNOSIS — C3490 Malignant neoplasm of unspecified part of unspecified bronchus or lung: Secondary | ICD-10-CM | POA: Diagnosis not present

## 2024-05-05 LAB — CBC WITH DIFFERENTIAL/PLATELET
Abs Immature Granulocytes: 0.13 K/uL — ABNORMAL HIGH (ref 0.00–0.07)
Basophils Absolute: 0 K/uL (ref 0.0–0.1)
Basophils Relative: 0 %
Eosinophils Absolute: 0.2 K/uL (ref 0.0–0.5)
Eosinophils Relative: 2 %
HCT: 26.3 % — ABNORMAL LOW (ref 36.0–46.0)
Hemoglobin: 8.3 g/dL — ABNORMAL LOW (ref 12.0–15.0)
Immature Granulocytes: 1 %
Lymphocytes Relative: 6 %
Lymphs Abs: 0.9 K/uL (ref 0.7–4.0)
MCH: 26.8 pg (ref 26.0–34.0)
MCHC: 31.6 g/dL (ref 30.0–36.0)
MCV: 84.8 fL (ref 80.0–100.0)
Monocytes Absolute: 0.1 K/uL (ref 0.1–1.0)
Monocytes Relative: 1 %
Neutro Abs: 12.9 K/uL — ABNORMAL HIGH (ref 1.7–7.7)
Neutrophils Relative %: 90 %
Platelets: 189 K/uL (ref 150–400)
RBC: 3.1 MIL/uL — ABNORMAL LOW (ref 3.87–5.11)
RDW: 15.1 % (ref 11.5–15.5)
WBC: 14.3 K/uL — ABNORMAL HIGH (ref 4.0–10.5)
nRBC: 0 % (ref 0.0–0.2)

## 2024-05-05 LAB — BASIC METABOLIC PANEL WITH GFR
Anion gap: 14 (ref 5–15)
BUN: 14 mg/dL (ref 8–23)
CO2: 23 mmol/L (ref 22–32)
Calcium: 8.6 mg/dL — ABNORMAL LOW (ref 8.9–10.3)
Chloride: 98 mmol/L (ref 98–111)
Creatinine, Ser: 0.55 mg/dL (ref 0.44–1.00)
GFR, Estimated: >60 mL/min (ref >60)
Glucose, Bld: 216 mg/dL — ABNORMAL HIGH (ref 70–99)
Potassium: 4 mmol/L (ref 3.5–5.1)
Sodium: 135 mmol/L (ref 135–145)

## 2024-05-05 LAB — GLUCOSE, CAPILLARY
Glucose-Capillary: 243 mg/dL — ABNORMAL HIGH (ref 70–99)
Glucose-Capillary: 179 mg/dL — ABNORMAL HIGH (ref 70–99)
Glucose-Capillary: 339 mg/dL — ABNORMAL HIGH (ref 70–99)

## 2024-05-05 LAB — MAGNESIUM: Magnesium: 1.6 mg/dL — ABNORMAL LOW (ref 1.7–2.4)

## 2024-05-05 MED ORDER — MAGNESIUM SULFATE 4 GM/100ML IV SOLN
4.0000 g | Freq: Once | INTRAVENOUS | Status: AC
  Administered 2024-05-05: 4 g via INTRAVENOUS
  Filled 2024-05-05: qty 100

## 2024-05-05 MED ORDER — HEPARIN SOD (PORK) LOCK FLUSH 100 UNIT/ML IV SOLN
500.0000 [IU] | Freq: Once | INTRAVENOUS | Status: AC
  Administered 2024-05-05: 500 [IU] via INTRAVENOUS
  Filled 2024-05-05: qty 5

## 2024-05-05 MED ORDER — LEVOFLOXACIN 750 MG PO TABS: 750.0000 mg | ORAL_TABLET | Freq: Every day | ORAL | 0 refills | Status: AC

## 2024-05-05 MED ORDER — LACTULOSE 10 GM/15ML PO SOLN: 30.0000 g | Freq: Two times a day (BID) | ORAL | 0 refills | Status: AC | PRN

## 2024-05-05 MED ORDER — GUAIFENESIN ER 600 MG PO TB12: 600.0000 mg | ORAL_TABLET | Freq: Two times a day (BID) | ORAL | 0 refills | Status: AC | PRN

## 2024-05-05 NOTE — Progress Notes (Signed)
 Brenda [name]   DOB:August 29, 1961   FM#:[id number]    ASSESSMENT & PLAN:   Endometrial adenocarcinoma [facility]) The patient was originally diagnosed with uterine cancer in 2023, status post surgery and adjuvant radiation treatment She was noted to have cancer recurrence in July 2025 when she presented with severe back pain with CT imaging showing recurrent disease in the retroperitoneal lymph node.  CT imaging of the chest shows spiculated lung mass suspicious for metastatic spread Pathology: Left retroperitoneal mass biopsy confirmed adenocarcinoma, ER positive, p53 wild-type.  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, she has MSI high disease   She was supposed to start first dose of chemotherapy on August 22.  However, the patient was admitted to the hospital for evaluation of altered mental status/suspected stroke.  All her workup came back negative for stroke but the patient did present with altered mental status likely due to diabetic ketoacidosis MRI of the head was negative She received her first dose of chemotherapy on May 01, 2024, complicated by recurrent admission again Continue supportive care  Recurrent hospitalizations for altered mental status This reason for admission is not clear The patient is prone to get altered mental status likely metabolic in nature I am not convinced she have recurrent infection She has chronic leukocytosis likely due to her disease  Type 2 diabetes mellitus with complication, with long-term current use of insulin  (HCC) We discussed importance of close vigilance and monitoring of blood sugar I will defer to her primary care doctor for management of her diabetes  Cancer associated pain Her pain is well-controlled Continue judicious use of pain medicine due to her recurrent altered mental status  Anemia due to chronic illness This is likely anemia of chronic disease. The patient denies recent history of bleeding such as epistaxis, hematuria or hematochezia. She is  asymptomatic from the anemia. We will observe for now.  She does not require transfusion now. I do not recommend any further work-up at this time.    Discharge planning Will defer to primary service She has appointment to see me next week  Almarie Bedford, MD 05/05/2024 8:34 AM  Subjective:  I was notified of her admission.  She was readmitted due to altered mental status This morning, she is completely oriented Her pain is well-controlled She complained of mild constipation She tolerated recent chemotherapy well  Objective:  Vitals:   05/04/24 2055 05/05/24 0439  BP: (!) 109/49 135/71  Pulse: 93 98  Resp: 18 18  Temp: 97.6 F (36.4 C) 98 F (36.7 C)  SpO2: 99% 97%     Intake/Output Summary (Last 24 hours) at 05/05/2024 0834 Last data filed at 05/05/2024 9275 Gross per 24 hour  Intake 1519.07 ml  Output 1550 ml  Net -30.93 ml    I personally reviewed CT imaging from May 04, 2024

## 2024-05-05 NOTE — Progress Notes (Signed)
 Patient port de accessed, PIV removed. AVS printed and went over with patient. No educational needs at this time. Patient discharged to home with family. Taken downstairs by wheelchair by Ronal, CNA

## 2024-05-05 NOTE — Consult Note (Signed)
 Consultation Note Date: 05/05/2024   Patient Name: Brenda [name]  DOB: Oct 19, 1960  MRN: [id number]  Age / Sex: 63 y.o., female  PCP: Lonn Hicks, MD Referring Physician: Caleen Qualia, MD  Reason for Consultation: Establishing goals of care  HPI/Patient Profile: 63 y.o. female  with past medical history of  uterine cancer in 2023, s/p surgery and adjuvant radiation treatment, had recurrence in July 2025 admitted on 05/04/2024 with altered mental status and possible PNA.   Clinical Assessment and Goals of Care:  Palliative medicine is specialized medical care for people living with serious illness. It focuses on providing relief from the symptoms and stress of a serious illness. The goal is to improve quality of life for both the patient and the family. Goals of care: Broad aims of medical therapy in relation to the patient's values and preferences. Our aim is to provide medical care aimed at enabling patients to achieve the goals that matter most to them, given the circumstances of their particular medical situation and their constraints.   Awake alert, sitting in chair, states that she met with her oncologist this am, has bene told that she could probably be discharged today and that she is to follow up at the cancer center.   Denies pain or any other acute symptoms, discussed with her about outpatient palliative support at the cancer center, she is in agreement to proceed.   NEXT OF KIN  Husband, 2 daughters, grand son and also nephew.   SUMMARY OF RECOMMENDATIONS    Full Code, Full Scope care.  Continue current pain regimen - MS Contin  scheduled and Oxy IR PO PRN Will set her up with my colleague Ms Cousar NP in the next few weeks, for ongoing GOC discussion and also for symptom management.  Thank you for the consult.   Code Status/Advance Care Planning: Full code   Symptom Management:      Palliative Prophylaxis:  Frequent Pain Assessment  Additional Recommendations (Limitations, Scope, Preferences): Full Scope Treatment  Psycho-social/Spiritual:  Desire for further Chaplaincy support:yes Additional Recommendations: Caregiving  Support/Resources  Prognosis:  Unable to determine  Discharge Planning: Home with Home Health      Primary Diagnoses: Present on Admission:  Endometrial carcinoma (HCC)  Chronic diastolic CHF (congestive heart failure) (HCC)  Essential hypertension  Hyperlipidemia  Anxiety with depression  Obstructive sleep apnea  Chronic hypoxic respiratory failure (HCC)  Cancer related pain  CAP (community acquired pneumonia)   I have reviewed the medical record, interviewed the patient and family, and examined the patient. The following aspects are pertinent.  Past Medical History:  Diagnosis Date   Anemia    Arthritis    back, hands, hips (02/22/2015)   CAD (coronary artery disease)    a. complex LAD/diagonal bifurcation PCI in 2010. b. STEMI 10/2019 s/p PTCA/DES x1 to mLAD overlapping the old stent, residual disease treated medicaly.   Cancer [facility])    uterine   CHF (congestive heart failure) (HCC)    Depression    Diabetes mellitus (HCC)  started when I was pregnant; not sure if it was type 1 or type 2    History of radiation therapy    Endometrium- HDR 01/22/22-03/07/22- Dr. Lynwood Nasuti   Hypercholesterolemia    Hypertension    Morbid obesity (HCC)    Myocardial infarction (HCC)    mild x 3   Neuromuscular disorder (HCC)    neuropathy feet   Sleep apnea    cpap/   Social History   Socioeconomic History   Marital status: Married    Spouse name: Not on file   Number of children: 2   Years of education: Not on file   Highest education level: Not on file  Occupational History   Occupation: Housewife  Tobacco Use   Smoking status: Former    Current packs/day: 0.00    Average packs/day: 0.5 packs/day for 1 year (0.5  ttl pk-yrs)    Types: Cigarettes, Cigars    Start date: 09/03/1985    Quit date: 09/03/1986    Years since quitting: 37.6   Smokeless tobacco: Never  Vaping Use   Vaping status: Never Used  Substance and Sexual Activity   Alcohol use: No   Drug use: No   Sexual activity: Not Currently  Other Topics Concern   Not on file  Social History Narrative   Not on file   Social Drivers of Health   Financial Resource Strain: High Risk (02/10/2021)   Overall Financial Resource Strain (CARDIA)    Difficulty of Paying Living Expenses: Hard  Food Insecurity: No Food Insecurity (05/04/2024)   Hunger Vital Sign    Worried About Running Out of Food in the Last Year: Never true    Ran Out of Food in the Last Year: Never true  Recent Concern: Food Insecurity - Food Insecurity Present (04/20/2024)   Hunger Vital Sign    Worried About Running Out of Food in the Last Year: Sometimes true    Ran Out of Food in the Last Year: Never true  Transportation Needs: No Transportation Needs (05/04/2024)   PRAPARE - Administrator, Civil Service (Medical): No    Lack of Transportation (Non-Medical): No  Physical Activity: Not on file  Stress: Not on file  Social Connections: Unknown (05/04/2024)   Social Connection and Isolation Panel    Frequency of Communication with Friends and Family: Once a week    Frequency of Social Gatherings with Friends and Family: Never    Attends Religious Services: More than 4 times per year    Active Member of [employer] or Organizations: No    Attends Engineer, structural: 1 to 4 times per year    Marital Status: Patient declined   Family History  Problem Relation Age of Onset   Coronary artery disease Mother        in her mid 55s   Hypertension Mother    Cancer Mother        skin   Heart attack Father        in his mid 52s   COPD Father    Stroke Sister    Coronary artery disease Sister    Diabetes Sister    Other Half-Sister    Thyroid  cancer Daughter     Prostate cancer Neg Hx    Colon cancer Neg Hx    Breast cancer Neg Hx    Endometrial cancer Neg Hx    Ovarian cancer Neg Hx    Pancreatic cancer Neg Hx  Scheduled Meds:  atorvastatin   80 mg Oral QPM   buPROPion   150 mg Oral q AM   Chlorhexidine  Gluconate Cloth  6 each Topical Daily   enoxaparin  (LOVENOX ) injection  40 mg Subcutaneous Q24H   FLUoxetine   40 mg Oral Daily   furosemide   40 mg Oral Daily   insulin  aspart  0-5 Units Subcutaneous QHS   insulin  aspart  0-6 Units Subcutaneous TID WC   insulin  glargine  20 Units Subcutaneous Daily   losartan   25 mg Oral Daily   morphine   15 mg Oral QHS   prasugrel   5 mg Oral q AM   pregabalin   50 mg Oral BID   senna-docusate  1-2 tablet Oral BID   sodium chloride  flush  10-40 mL Intracatheter Q12H   sodium chloride  flush  3 mL Intravenous Q12H   sodium chloride  flush  3 mL Intravenous Q12H   Continuous Infusions:  ceFEPime  (MAXIPIME ) IV Stopped (05/05/24 0546)   magnesium  sulfate bolus IVPB     vancomycin  166.7 mL/hr at 05/05/24 0641   PRN Meds:.acetaminophen  **OR** acetaminophen , guaiFENesin , ipratropium-albuterol , nitroGLYCERIN , oxyCODONE , prochlorperazine , sodium chloride  flush, sodium chloride  flush Medications Prior to Admission:  Prior to Admission medications   Medication Sig Start Date End Date Taking? Authorizing Provider  acetaminophen  (TYLENOL ) 500 MG tablet Take 1-2 tablets (500-1,000 mg total) by mouth every 8 (eight) hours as needed for mild pain (pain score 1-3) or moderate pain (pain score 4-6). Patient taking differently: Take 500 mg by mouth daily as needed for mild pain (pain score 1-3) or moderate pain (pain score 4-6). 03/30/24 03/30/25 Yes Hongalgi, Anand D, MD  buPROPion  (WELLBUTRIN  XL) 150 MG 24 hr tablet Take 150 mg by mouth in the morning.   Yes [provider]  Cholecalciferol  (VITAMIN D3 SUPER STRENGTH) 50 MCG (2000 UT) CAPS Take 2,000 Units by mouth in the morning.   Yes [provider]   cyproheptadine (PERIACTIN) 4 MG tablet Take 4 mg by mouth 3 (three) times daily.   Yes [provider]  diphenhydrAMINE  (BENADRYL ) 25 mg capsule Take 25 mg by mouth at bedtime as needed for sleep.   Yes [provider]  FIASP  FLEXTOUCH 100 UNIT/ML FlexTouch Pen Inject 5-8 Units into the skin See admin instructions. Inject 5 units into the skin before breakfast & lunch and 8 units before supper/evening meal 12/08/22  Yes [provider]  furosemide  (LASIX ) 40 MG tablet Take 1 tablet by mouth once daily 02/05/22  Yes Newlin, Enobong, MD  insulin  degludec (TRESIBA) 100 UNIT/ML FlexTouch Pen Inject 20 Units into the skin every evening.   Yes [provider]  lidocaine  (LIDODERM ) 5 % Place 1 patch onto the skin daily. 04/27/24  Yes [provider]  losartan  (COZAAR ) 25 MG tablet Take 1 tablet (25 mg total) by mouth daily. 07/19/23  Yes Wonda Sharper, MD  metFORMIN (GLUCOPHAGE-XR) 500 MG 24 hr tablet Take 1,000 mg by mouth in the morning and at bedtime. 07/05/22  Yes [provider]  Misc Natural Products (NEURIVA PO) Take 1 tablet by mouth daily.   Yes [provider]  morphine  (MS CONTIN ) 15 MG 12 hr tablet Take 1 tablet (15 mg total) by mouth at bedtime. 04/16/24  Yes Gorsuch, Ni, MD  nitroGLYCERIN  (NITROSTAT ) 0.4 MG SL tablet Dissolve 1 tablet under the tongue every 5 minutes as needed for chest pain. Max of 3 doses, then 911. 01/25/23  Yes Weaver, Scott T, PA-C  ondansetron  (ZOFRAN ) 8 MG tablet Take 1  tablet (8 mg total) by mouth every 8 (eight) hours as needed for nausea or vomiting. Start on the third day after carboplatin . 04/16/24  Yes Gorsuch, Ni, MD  oxyCODONE  (OXY IR/ROXICODONE ) 5 MG immediate release tablet Take 5 mg by mouth every 6 (six) hours as needed for severe pain (pain score 7-10) or moderate pain (pain score 4-6).   Yes [provider]  OXYGEN  Inhale 2 L/min into the lungs continuous.   Yes [provider]   polyethylene glycol powder (GLYCOLAX /MIRALAX ) 17 GM/SCOOP powder Take 17 g by mouth daily as needed for mild constipation. 09/17/23  Yes [provider]  pregabalin  (LYRICA ) 50 MG capsule Take 1 capsule (50 mg total) by mouth 2 (two) times daily. 04/24/24  Yes Gonfa, Taye T, MD  prochlorperazine  (COMPAZINE ) 10 MG tablet Take 1 tablet (10 mg total) by mouth every 6 (six) hours as needed for nausea or vomiting. 04/16/24  Yes Gorsuch, Ni, MD  senna-docusate (SENOKOT-S) 8.6-50 MG tablet Take 1-2 tablets by mouth 2 (two) times daily between meals as needed for mild constipation or moderate constipation. Patient taking differently: Take 1-2 tablets by mouth as needed for mild constipation or moderate constipation. 04/24/24  Yes Gonfa, Taye T, MD  atorvastatin  (LIPITOR ) 80 MG tablet Take 1 tablet (80 mg total) by mouth every evening. 10/03/21   Newlin, Enobong, MD  cefadroxil  (DURICEF) 500 MG capsule Take 1,000 mg by mouth 2 (two) times daily. Patient not taking: Reported on 05/04/2024    [provider]  cyclobenzaprine (FLEXERIL) 10 MG tablet Take 10 mg by mouth as needed for muscle spasms. Patient not taking: Reported on 05/04/2024    [provider]  FLUoxetine  (PROZAC ) 40 MG capsule Take 40 mg by mouth daily. 01/17/23   [provider]  lidocaine -prilocaine  (EMLA ) cream Apply to affected area once Patient not taking: Reported on 05/04/2024 04/16/24   Lonn Hicks, MD  methocarbamol  (ROBAXIN ) 500 MG tablet Take 500 mg by mouth as needed for muscle spasms. Patient not taking: Reported on 05/04/2024    [provider]  Misc. Devices MISC Portable oxygen  concentrator, 2L oxygen .  Diagnoses-chronic respiratory failure with hypoxia 11/27/21   Delbert Clam, MD  prasugrel  (EFFIENT ) 5 MG TABS tablet Take 1 tablet (5 mg total) by mouth in the morning. AFTER TAKING YOUR DOSE ON 04/07/2024, STOP TAKING IT IN PREPARATION FOR THE BIOPSY ON 04/15/2024. AFTER THE BIOPSY, FOLLOW THE  RADIOLOGIST RECOMMENDATIONS AS TO TIMING OF RESTARTING THIS MEDICINE. Patient not taking: Reported on 05/04/2024 03/30/24   Judeth Trenda BIRCH, MD  sacubitril -valsartan  (ENTRESTO ) 24-26 MG Take 1 tablet by mouth 2 (two) times daily.    [provider]  semaglutide -weight management (WEGOVY ) 0.25 MG/0.5ML SOAJ SQ injection Inject 0.25 mg into the skin once a week. Patient not taking: Reported on 05/04/2024    [provider]   Allergies  Allergen Reactions   Hydrocodone Shortness Of Breath, Dermatitis and Other (See Comments)    I forget to breathe.   Clopidogrel Rash and Dermatitis   Review of Systems No pain  Physical Exam Awake alert Sitting in chair Has hirsutism No edema Regular work of breathing  Vital Signs: BP 135/71 (BP Location: Right Arm)   Pulse 98   Temp 98 F (36.7 C) (Oral)   Resp 18   LMP 10/30/2014 (Approximate)   SpO2 97%  Pain Scale: 0-10   Pain Score: 6    SpO2: SpO2: 97 % O2 Device:SpO2: 97 % O2 Flow Rate: .O2 Flow  Rate (L/min): 2 L/min  IO: Intake/output summary:  Intake/Output Summary (Last 24 hours) at 05/05/2024 1003 Last data filed at 05/05/2024 9041 Gross per 24 hour  Intake 1522.07 ml  Output 1550 ml  Net -27.93 ml    LBM: Last BM Date : 05/03/24 Baseline Weight:   Most recent weight:       Palliative Assessment/Data:   PPS 60%  Time In:  9 Time Out:  10 Time Total:  60  Greater than 50%  of this time was spent counseling and coordinating care related to the above assessment and plan.  Signed by: Lonia Serve, MD   Please contact Palliative Medicine Team phone at [phone] for questions and concerns.  For individual provider: See Tracey

## 2024-05-05 NOTE — Discharge Summary (Signed)
 Physician Discharge Summary   Patient: Brenda [name] MRN: [id number] DOB: Aug 14, 1961  Admit date:     05/04/2024  Discharge date: 05/05/24  Discharge Physician: Amaryllis Dare   PCP: Lonn Hicks, MD   Recommendations at discharge:  Please obtain CBC and CMP on follow-up Follow-up with primary care provider Follow-up with oncology  Discharge Diagnoses: Principal Problem:   Community acquired pneumonia Active Problems:   Hyperlipidemia   Essential hypertension   Obstructive sleep apnea   Type 2 diabetes mellitus with complication, with long-term current use of insulin  (HCC)   Anxiety with depression   Chronic diastolic CHF (congestive heart failure) (HCC)   Chronic hypoxic respiratory failure (HCC)   Endometrial carcinoma (HCC)   Cancer related pain   Adenocarcinoma of lung (HCC)   History of CAD (coronary artery disease)   CAP (community acquired pneumonia)   Metastatic malignant neoplasm [facility])   Hospital Course:  Brenda [name] is a 63 y.o. 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 Patient also complaining about abdominal pain and back pain.  In the ED patient is alert oriented not confused.  Patient was recently hospitalized and discharged on 8/22 for acute metabolic encephalopathy in the setting of polypharmacy, DKA,  delirium, and UTI.   CT abdomen and pelvis with concern of disease progression.  There was also some concern of pneumonia so she was started on antibiotics.  Oncology was also notified.  Patient received cefepime  in and was also given a dose of vancomycin  while in the hospital.  Clinically remained stable with no new respiratory symptoms.  Due to her significant underlying comorbidities she was given 3 more days of Levaquin  to complete the course for concerning pneumonia.  Differential remained open for pneumonia versus lung cancer.  Patient has mild hyponatremia, likely due to her underlying lung disease.  She remains stable on 2 L of oxygen  which is her baseline.  Also found to have hypomagnesemia which was repleted before discharge.  Oncology evaluated her and recommending a close outpatient follow-up which was provided.  Patient will continue with her home medications and need to have a close follow-up with her providers for further assistance.  Patient with poor prognosis with underlying life limiting comorbidities.  Palliative care was also consulted and will continue follow-up as outpatient.  She is currently full code and full scope of care.  Need continuation of goals of care discussion.  Pain control - [place]  Controlled Substance Reporting System database was reviewed. and patient was instructed, not to drive, operate heavy machinery, perform activities at heights, swimming or participation in water activities or provide baby-sitting services while on Pain, Sleep and Anxiety Medications; until their outpatient Physician has advised to do so again. Also recommended to not to take more than prescribed Pain, Sleep and Anxiety Medications.   Consultants: Oncology Procedures performed: None Disposition: Home Diet recommendation:  Discharge Diet Orders (From admission, onward)     Start     Ordered   05/05/24 0000  Diet - low sodium heart healthy        05/05/24 1057            Cardiac and Carb modified diet DISCHARGE MEDICATION: Allergies as of 05/05/2024       Reactions   Hydrocodone Shortness Of Breath, Dermatitis, Other (See Comments)   I forget to breathe.   Clopidogrel Rash, Dermatitis        Medication List     STOP taking these medications    cefadroxil  500 MG capsule Commonly known as: DURICEF   cyclobenzaprine 10 MG tablet Commonly known as: FLEXERIL   Entresto  24-26 MG Generic drug: sacubitril -valsartan    methocarbamol  500 MG tablet Commonly known as: ROBAXIN    semaglutide -weight management 0.25 MG/0.5ML Soaj SQ injection Commonly known as: WEGOVY        TAKE these medications    acetaminophen  500 MG tablet Commonly known as: TYLENOL  Take 1-2 tablets (500-1,000 mg total) by mouth every 8 (eight) hours as needed for mild pain (pain score 1-3) or moderate pain (pain score 4-6). What changed:  how much to take when to take this   atorvastatin  80 MG tablet Commonly known as: LIPITOR  Take 1 tablet (80 mg total) by mouth every evening.   buPROPion  150 MG 24 hr tablet Commonly known as: WELLBUTRIN  XL Take 150 mg by mouth in the morning.   cyproheptadine 4 MG tablet Commonly known as: PERIACTIN Take 4 mg by mouth 3 (three) times daily.   diphenhydrAMINE  25 mg capsule Commonly known as: BENADRYL  Take 25 mg by mouth at bedtime as needed for sleep.   Fiasp  FlexTouch 100 UNIT/ML FlexTouch Pen Generic drug: insulin  aspart Inject 5-8 Units into the skin See admin instructions. Inject 5 units into the skin before breakfast & lunch and 8 units before supper/evening meal   FLUoxetine  40 MG capsule Commonly known as: PROZAC  Take 40 mg by mouth daily.   furosemide  40 MG tablet Commonly known as: LASIX  Take 1 tablet by mouth once daily   guaiFENesin  600 MG 12 hr tablet Commonly known as: MUCINEX  Take 1 tablet (600 mg total) by mouth 2 (two) times daily as needed for cough or to loosen phlegm.   insulin  degludec 100  UNIT/ML FlexTouch Pen Commonly known as: TRESIBA Inject 20 Units into the skin every evening.   lactulose  10 GM/15ML solution Commonly known as: CHRONULAC  Take 45 mLs (30 g total) by mouth 2 (two) times daily as needed for mild constipation or moderate constipation.   levofloxacin  750 MG tablet Commonly known as: Levaquin  Take 1 tablet (750 mg total) by mouth daily for 3 days.   lidocaine  5 % Commonly known as: LIDODERM  Place 1 patch onto the skin daily.   lidocaine -prilocaine  cream Commonly known as: EMLA  Apply to affected area once   losartan  25 MG tablet Commonly known as: COZAAR  Take 1 tablet (25 mg total) by mouth daily.   metFORMIN 500 MG 24 hr tablet Commonly known as: GLUCOPHAGE-XR Take 1,000 mg by mouth in the morning and at bedtime.   Misc. Devices Misc Portable oxygen  concentrator, 2L oxygen .  Diagnoses-chronic respiratory failure with hypoxia   morphine  15 MG 12 hr tablet Commonly known as: MS CONTIN  Take 1 tablet (15 mg total) by mouth at bedtime.   NEURIVA PO Take 1 tablet by mouth daily.   nitroGLYCERIN  0.4 MG SL tablet Commonly known as: NITROSTAT  Dissolve 1 tablet under the tongue every 5 minutes as needed for chest pain. Max of 3 doses, then 911.  ondansetron  8 MG tablet Commonly known as: ZOFRAN  Take 1 tablet (8 mg total) by mouth every 8 (eight) hours as needed for nausea or vomiting. Start on the third day after carboplatin .   oxyCODONE  5 MG immediate release tablet Commonly known as: Oxy IR/ROXICODONE  Take 5 mg by mouth every 6 (six) hours as needed for severe pain (pain score 7-10) or moderate pain (pain score 4-6).   OXYGEN  Inhale 2 L/min into the lungs continuous.   polyethylene glycol powder 17 GM/SCOOP powder Commonly known as: GLYCOLAX /MIRALAX  Take 17 g by mouth daily as needed for mild constipation.   prasugrel  5 MG Tabs tablet Commonly known as: Effient  Take 1 tablet (5 mg total) by mouth in the morning. AFTER TAKING YOUR DOSE  ON 04/07/2024, STOP TAKING IT IN PREPARATION FOR THE BIOPSY ON 04/15/2024. AFTER THE BIOPSY, FOLLOW THE RADIOLOGIST RECOMMENDATIONS AS TO TIMING OF RESTARTING THIS MEDICINE.   pregabalin  50 MG capsule Commonly known as: LYRICA  Take 1 capsule (50 mg total) by mouth 2 (two) times daily.   prochlorperazine  10 MG tablet Commonly known as: COMPAZINE  Take 1 tablet (10 mg total) by mouth every 6 (six) hours as needed for nausea or vomiting.   senna-docusate 8.6-50 MG tablet Commonly known as: Senokot-S Take 1-2 tablets by mouth 2 (two) times daily between meals as needed for mild constipation or moderate constipation. What changed: when to take this   Vitamin D3 Super Strength 50 MCG (2000 UT) Caps Generic drug: Cholecalciferol  Take 2,000 Units by mouth in the morning.        Follow-up Information     Lonn Hicks, MD. Schedule an appointment as soon as possible for a visit.   Specialty: Hematology and Oncology Contact information: [address] [place] [address] [phone]                Discharge Exam: There were no vitals filed for this visit. General.  Frail lady, in no acute distress. Pulmonary.  Lungs clear bilaterally, normal respiratory effort.  Mildly decreased breath sounds at left base CV.  Regular rate and rhythm, no JVD, rub or murmur. Abdomen.  Soft, nontender, nondistended, BS positive. CNS.  Alert and oriented .  No focal neurologic deficit. Extremities.  No edema, no cyanosis, pulses intact and symmetrical. Psychiatry.  Judgment and insight appears normal.   Condition at discharge: stable  The results of significant diagnostics from this hospitalization (including imaging, microbiology, ancillary and laboratory) are listed below for reference.   Imaging Studies: CT ABDOMEN PELVIS W CONTRAST Result Date: 05/04/2024 CLINICAL DATA:  63 year old female with abdominal pain. Metastatic endometrial cancer. * Tracking Code: BO * EXAM: CT  ABDOMEN AND PELVIS WITH CONTRAST TECHNIQUE: Multidetector CT imaging of the abdomen and pelvis was performed using the standard protocol following bolus administration of intravenous contrast. RADIATION DOSE REDUCTION: This exam was performed according to the departmental dose-optimization program which includes automated exposure control, adjustment of the mA and/or kV according to patient size and/or use of iterative reconstruction technique. CONTRAST:  OMNIPAQUE  IOHEXOL  300 MG/ML  SOLN COMPARISON:  CT Abdomen and Pelvis 03/29/2024. FINDINGS: Lower chest: Stable elevation of the right hemidiaphragm. Borderline to mild cardiomegaly is stable. No pericardial effusion. Trace layering left pleural effusion is new since July. And there is confluent new posterior basal segment left lower lobe peribronchial airspace opacity with air bronchograms (series 6, image 51). The visible airways remain patent. No suspicious lung base nodule or discrete mass. Hepatobiliary: Chronically absent gallbladder and stable prominence  of intra- and extrahepatic biliary ducts since July. No discrete liver mass. Pancreas: Stable atrophy. Spleen: Stable and negative. Adrenals/Urinary Tract: Mass effect on the left kidney from bulky, which left abdominal retroperitoneal, paraspinal, pararenal space mass is further detailed below. Invasion of the left pararenal space as before. Involvement of the left main renal vessels also (series 2, image 38). But renal enhancement remain symmetric. Renal contrast excretion is symmetric. The left ureter is located along the anterior margin of the large tumor and is nondilated. Adrenal glands remain within normal limits. Bladder is negative. Stomach/Bowel: Nondilated large and small bowel. Redundant sigmoid colon. Mild large bowel retained stool. Cecum partially located in the pelvis. Decompressed terminal ileum. Normal appendix on series 2, image 73. Small fat containing ventral abdominal hernia near  the midline on series 2, image 67 is stable. No herniated bowel. Stomach and duodenum appear negative. No pneumoperitoneum. No free fluid. Vascular/Lymphatic: Large and heterogeneously enhancing nearly 10 cm left abdominal retroperitoneal, paraspinal, pararenal space mass. This may be severe ex nodal tumor, and has progressed since July which is further detailed below. That tumor is inseparable from the infrarenal abdominal aorta which remains patent on series 2, image 44. Tumor extends to the aortoiliac bifurcation which is patent. Underlying mild for age Aortoiliac calcified atherosclerosis. More advanced calcified femoral artery atherosclerosis. Major arterial branches remain patent. Portal venous system and IVC also appear patent. Left external iliac lymphadenopathy is stable measuring up to 18 mm short axis. No other lymphadenopathy. Reproductive: Surgically absent. Other: No pelvis free fluid. 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 But subtle if any interval erosion into the adjacent L2 and L3 vertebral bodies (perhaps on series 2, image 41). No discrete bone metastasis by CT. Body wall edema not significantly changed since July. IMPRESSION: 1. Very large and progressed since July Left abdominal tumor occupying and invading the lumbar paraspinal, retroperitoneal, para-aortic, and pararenal spaces. Tumor is now up to 9.5 cm transaxial, directly involves the left renal vessels and abuts the aorta - which remain patent. Early if any invasion into the adjacent L2 and L3 lumbar vertebrae. 2. Confluent new Left lower lobe posterior basal segment peribronchial consolidation, more suspicious for Pneumonia or Aspiration than metastatic disease. Small new layering left pleural effusion. 3.  Stable left external iliac lymphadenopathy. No brand new metastasis is identified in the abdomen or pelvis. Electronically Signed   By: VEAR Hurst M.D.   On: 05/04/2024 04:04   DG Chest Port 1 View Result Date: 05/04/2024 CLINICAL DATA:  Dyspnea EXAM: PORTABLE CHEST 1 VIEW COMPARISON:  04/20/2024 FINDINGS: Stable elevation of the right hemidiaphragm. Lungs are clear. No pneumothorax or pleural effusion. Right internal jugular chest port tip seen within the superior cavoatrial junction. Cardiac size within limits. Pulmonary vascularity is normal. No acute bone abnormality. IMPRESSION: 1. No active disease. Electronically Signed   By: Dorethia Molt M.D.   On: 05/04/2024 02:47   ECHOCARDIOGRAM COMPLETE Result Date: 04/21/2024    ECHOCARDIOGRAM REPORT   Patient Name:   JUNEAU DOUGHMAN Llanas Date of Exam: 04/21/2024 Medical Rec #:  [id number]           Height:       62.0 in Accession #:    [id number]          Weight:  190.9 lb Date of Birth:  Nov 02, 1960           BSA:          1.874 m Patient Age:    62 years            BP:           119/59 mmHg Patient Gender: F                   HR:           95 bpm. Exam Location:  Inpatient Procedure: 2D Echo, Cardiac Doppler and Color Doppler (Both Spectral and Color            Flow Doppler were utilized during procedure).                                 MODIFIED REPORT:  This report was modified by Jerel Balding MD on 04/21/2024 due to Added saline                                 contrast report.  Indications:     Stroke  History:         Patient has prior history of Echocardiogram examinations, most                  recent 08/23/2023. CAD; Risk Factors:Hypertension, Dyslipidemia                  and Diabetes.  Sonographer:     Philomena Daring Referring Phys:  [id number] Diagnosing Phys: Jerel Balding MD IMPRESSIONS  1. Left ventricular ejection fraction, by estimation, is 60 to 65%. The left ventricle has normal function. The left ventricle has no regional wall motion  abnormalities. Left ventricular diastolic parameters are consistent with Grade I diastolic dysfunction (impaired relaxation).  2. Right ventricular systolic function is mildly reduced. The right ventricular size is normal. Tricuspid regurgitation signal is inadequate for assessing PA pressure.  3. Left atrial size was mildly dilated.  4. The mitral valve is degenerative. No evidence of mitral valve regurgitation. No evidence of mitral stenosis. Moderate mitral annular calcification.  5. The aortic valve is tricuspid. There is mild calcification of the aortic valve. There is moderate thickening of the aortic valve. Aortic valve regurgitation is mild. Mild aortic valve stenosis.  6. The inferior vena cava is normal in size with greater than 50% respiratory variability, suggesting right atrial pressure of 3 mmHg.  7. Agitated saline contrast bubble study was negative, with no evidence of any interatrial shunt. Comparison(s): Prior images reviewed side by side. There is now mild aortic stenosis. FINDINGS  Left Ventricle: Left ventricular ejection fraction, by estimation, is 60 to 65%. The left ventricle has normal function. The left ventricle has no regional wall motion abnormalities. The left ventricular internal cavity size was normal in size. There is  no left ventricular hypertrophy. Left ventricular diastolic parameters are consistent with Grade I diastolic dysfunction (impaired relaxation). Right Ventricle: The right ventricular size is normal. No increase in right ventricular wall thickness. Right ventricular systolic function is mildly reduced. Tricuspid regurgitation signal is inadequate for assessing PA pressure. Left Atrium: Left atrial size was mildly dilated. Right Atrium: Right atrial size was not well visualized. Pericardium: There is no evidence of pericardial effusion. Mitral Valve: The mitral valve is degenerative in appearance.  Moderate mitral annular calcification. No evidence of mitral valve  regurgitation. No evidence of mitral valve stenosis. Tricuspid Valve: The tricuspid valve is normal in structure. Tricuspid valve regurgitation is not demonstrated. Aortic Valve: The aortic valve is tricuspid. There is mild calcification of the aortic valve. There is moderate thickening of the aortic valve. Aortic valve regurgitation is mild. Mild aortic stenosis is present. Aortic valve mean gradient measures 13.7 mmHg. Aortic valve peak gradient measures 26.6 mmHg. Aortic valve area, by VTI measures 1.52 cm. Pulmonic Valve: The pulmonic valve was not well visualized. Pulmonic valve regurgitation is not visualized. No evidence of pulmonic stenosis. Aorta: The aortic root and ascending aorta are structurally normal, with no evidence of dilitation. Venous: The inferior vena cava is normal in size with greater than 50% respiratory variability, suggesting right atrial pressure of 3 mmHg. IAS/Shunts: No atrial level shunt detected by color flow Doppler. Agitated saline contrast was given intravenously to evaluate for intracardiac shunting. Agitated saline contrast bubble study was negative, with no evidence of any interatrial shunt.  LEFT VENTRICLE PLAX 2D LVIDd:         4.32 cm   Diastology LVIDs:         2.95 cm   LV e' medial:    6.31 cm/s LV PW:         1.32 cm   LV E/e' medial:  20.3 LV IVS:        1.08 cm   LV e' lateral:   7.62 cm/s LVOT diam:     2.08 cm   LV E/e' lateral: 16.8 LV SV:         68 LV SV Index:   36 LVOT Area:     3.40 cm  RIGHT VENTRICLE            IVC RV S prime:     8.92 cm/s  IVC diam: 1.22 cm TAPSE (M-mode): 1.8 cm LEFT ATRIUM             Index        RIGHT ATRIUM           Index LA diam:        3.28 cm 1.75 cm/m   RA Area:     12.50 cm LA Vol (A2C):   57.3 ml 30.57 ml/m  RA Volume:   26.10 ml  13.93 ml/m LA Vol (A4C):   39.3 ml 20.97 ml/m LA Biplane Vol: 48.8 ml 26.04 ml/m  AORTIC VALVE AV Area (Vmax):    1.41 cm AV Area (Vmean):   1.43 cm AV Area (VTI):     1.52 cm AV Vmax:            258.00 cm/s AV Vmean:          169.333 cm/s AV VTI:            0.444 m AV Peak Grad:      26.6 mmHg AV Mean Grad:      13.7 mmHg LVOT Vmax:         107.00 cm/s LVOT Vmean:        71.500 cm/s LVOT VTI:          0.199 m LVOT/AV VTI ratio: 0.45  AORTA Ao Root diam: 2.58 cm Ao Asc diam:  3.32 cm MITRAL VALVE MV Area (PHT): 4.21 cm     SHUNTS MV Decel Time: 180 msec     Systemic VTI:  0.20 m MV E velocity: 128.00 cm/s  Systemic Diam:  2.08 cm MV A velocity: 169.00 cm/s MV E/A ratio:  0.76 Mihai Croitoru MD Electronically signed by Jerel Balding MD Signature Date/Time: 04/21/2024/3:56:00 PM    Final (Updated)    MR Brain W and Wo Contrast Result Date: 04/21/2024 EXAM: MRI BRAIN WITH AND WITHOUT CONTRAST 04/21/2024 12:43:21 PM TECHNIQUE: Multiplanar multisequence MRI of the head/brain was performed with and without the administration of intravenous contrast. COMPARISON: CT of the head dated 04/20/2024. CLINICAL HISTORY: Aphasia. 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 FINDINGS: BRAIN AND VENTRICLES: No acute infarct. No acute intracranial hemorrhage. No mass effect or midline shift. No hydrocephalus. The sella is unremarkable. Normal flow voids. No mass or abnormal enhancement. ORBITS: No acute abnormality. SINUSES: No acute abnormality. BONES AND SOFT TISSUES: Normal bone marrow signal and enhancement. No acute soft tissue abnormality. IMPRESSION: 1. No acute intracranial abnormality. 2. No mass or abnormal enhancement. Electronically signed by: Evalene Coho MD 04/21/2024 12:49 PM EDT RP Workstation: [id number]   DG CHEST PORT 1 VIEW Result Date: 04/20/2024 CLINICAL DATA:  Leukocytosis EXAM: PORTABLE CHEST 1 VIEW COMPARISON:   04/19/2023 and CT chest from 04/03/2024 FINDINGS: Low lung volumes are present, causing crowding of the pulmonary vasculature. Scarring or atelectasis at both lung bases, right greater than left, with suspected elevated right hemidiaphragm. Right IJ Port-A-Cath tip: Right atrium. Coronary atherosclerosis with coronary stents. Probable mitral valve calcification. Moderate degenerative glenohumeral arthropathy bilaterally. IMPRESSION: 1. Low lung volumes with bibasilar scarring or atelectasis, right greater than left. 2. Coronary atherosclerosis with coronary stents. 3. Probable mitral valve calcification. 4. Moderate degenerative glenohumeral arthropathy bilaterally. Electronically Signed   By: Ryan Salvage M.D.   On: 04/20/2024 16:49   CT ANGIO HEAD NECK W WO CM W PERF (CODE STROKE) Result Date: 04/20/2024 CLINICAL DATA:  Provided history: Neuro deficit, acute, stroke suspected. EXAM: CT ANGIOGRAPHY HEAD AND NECK CT PERFUSION BRAIN TECHNIQUE: Multidetector CT imaging of the head and neck was performed using the standard protocol during bolus administration of intravenous contrast. Multiplanar CT image reconstructions and MIPs were obtained to evaluate the vascular anatomy. Carotid stenosis measurements (when applicable) are obtained utilizing NASCET criteria, using the distal internal carotid diameter as the denominator. Multiphase CT imaging of the brain was performed following IV bolus contrast injection. Subsequent parametric perfusion maps were calculated using RAPID software. RADIATION DOSE REDUCTION: This exam was performed according to the departmental dose-optimization program which includes automated exposure control, adjustment of the mA and/or kV according to patient size and/or use of iterative reconstruction technique. CONTRAST:  OMNIPAQUE  IOHEXOL  350 MG/ML SOLN COMPARISON:  Noncontrast head CT performed earlier today 04/20/2024. CT angiogram head 05/31/2016. Chest CT 04/03/2024. FINDINGS:  CTA NECK FINDINGS Aortic arch: Standard aortic branching. Atherosclerotic plaque within the aortic arch and proximal major branch vessels of the neck. Streak/beam hardening artifact arising from a dense contrast bolus partially obscures the right subclavian artery. Within this limitation, there is no appreciable hemodynamically significant innominate or proximal subclavian artery stenosis. Right carotid system: CCA and ICA patent within the neck without stenosis. Mild calcified atherosclerotic plaque about the carotid bifurcation. Left carotid system: CCA and ICA patent within the neck without stenosis. Mild calcified plaque within the proximal ICA. Vertebral arteries: Patent  within the neck without stenosis or significant atherosclerotic disease. The right vertebral artery is dominant. Skeleton: The neck bilaterally. No acute fracture or aggressive osseous lesion. Other neck: Subcentimeter right thyroid  lobe nodule not meeting consensus criteria for ultrasound follow-up based on size. No follow-up imaging recommended. Reference: J Am Coll Radiol. 2015 Feb;12(2): 143-50. Mild nonspecific edema within Upper chest: Only faint residual ground-glass opacity remains at site of the 12 mm right upper lobe pulmonary nodule described on the recent prior chest CT of 04/03/2024 (series 5, image 341). A 6 mm part solid nodule more medially within the right upper lobe is unchanged (series 5, image 335). Right chest infusion port catheter. Review of the MIP images confirms the above findings CTA HEAD FINDINGS Anterior circulation: The intracranial internal carotid arteries are patent. Non-stenotic atherosclerotic plaque within both vessels. The M1 middle cerebral arteries are patent. No M2 proximal branch occlusion or high-grade proximal stenosis. The anterior cerebral arteries are patent. No intracranial aneurysm is identified. Posterior circulation: The intracranial vertebral arteries are patent. The basilar artery is patent.  The posterior cerebral arteries are patent. Posterior communicating arteries are diminutive or absent, bilaterally. Venous sinuses: Within the limitations of contrast timing, no convincing thrombus. Anatomic variants: As described. Review of the MIP images confirms the above findings CT Brain Perfusion Findings: Poor source data/bolus quality limits reliability of the perfusion estimates. Within this limitation, findings are as follows. CBF (<30%) Volume: 0mL Perfusion (Tmax>6.0s) volume: 8 mL (right temporal lobe). Mismatch Volume: 8mL Infarction Location:None identified. No emergent large vessel occlusion identified. These results were communicated to Dr. Michaela at 2:26 pmon 8/18/2025by text page via the [facility] messaging system. IMPRESSION: CTA neck: 1. The common carotid and internal carotid arteries are patent within the neck without stenosis. Mild atherosclerotic plaque bilaterally, as described. 2. The vertebral arteries are patent within the neck without stenosis or significant atherosclerotic disease. 3. Aortic Atherosclerosis (ICD10-I70.0). 4. Only faint residual ground-glass opacity remains at site of the 12 mm right upper lobe pulmonary nodule described on the recent prior chest CT of 04/03/2024. 5. A 6 mm part-solid nodule more medially within the right upper lobe is unchanged. CTA head: 1. No proximal intracranial large vessel occlusion or high-grade proximal arterial stenosis is identified. 2. Non-stenotic atherosclerotic plaque within the intracranial ICAs. CT perfusion head: Poor source data/bolus quality limits reliability of the perfusion estimates. Within this limitation, findings are as follows. The perfusion software identifies no core infarct. The perfusion software identifies an 8 mL region of hypoperfusion in the right temporal lobe (utilizing the Tmax>6 seconds threshold). Reported mismatch volume: 8 mL. Electronically Signed   By: Rockey Childs D.O.   On: 04/20/2024 14:27   CT HEAD  CODE STROKE WO CONTRAST Result Date: 04/20/2024 CLINICAL DATA:  Provided history: Code stroke. Neuro deficit, acute, stroke suspected. EXAM: CT HEAD WITHOUT CONTRAST TECHNIQUE: Contiguous axial images were obtained from the base of the skull through the vertex without intravenous contrast. RADIATION DOSE REDUCTION: This exam was performed according to the departmental dose-optimization program which includes automated exposure control, adjustment of the mA and/or kV according to patient size and/or use of iterative reconstruction technique. COMPARISON:  CT angiogram head 05/31/2016. FINDINGS: Brain: Mild generalized cerebral atrophy. There is no acute intracranial hemorrhage. No demarcated cortical infarct. No extra-axial fluid collection. No evidence of an intracranial mass. No midline shift. Vascular: No hyperdense vessel.  Atherosclerotic calcifications. Skull: No calvarial fracture or aggressive osseous lesion. Sinuses/Orbits: No mass or acute finding within the imaged orbits.  Opacification of a single right ethmoid air cell. ASPECTS [facility] Stroke Program Early CT Score) - Ganglionic level infarction (caudate, lentiform nuclei, internal capsule, insula, M1-M3 cortex): 7 - Supraganglionic infarction (M4-M6 cortex): 3 Total score (0-10 with 10 being normal): 10 No evidence of an acute intracranial abnormality. These results were communicated to Dr. Michaela at 1:58 pmon 8/18/2025by text page via the [facility] messaging system. IMPRESSION: 1.  No evidence of an acute intracranial abnormality. 2. Mild generalized cerebral atrophy. 3. Mild right ethmoid sinusitis. Electronically Signed   By: Rockey Childs D.O.   On: 04/20/2024 14:00   IR IMAGING GUIDED PORT INSERTION Result Date: 04/08/2024 CLINICAL DATA:  Recurrent metastatic uterine carcinoma EXAM: RIGHT INTERNAL JUGULAR SINGLE LUMEN POWER PORT CATHETER INSERTION Date:  04/08/2024 04/08/2024 1:24 pm Radiologist:  M. Frederic Specking, MD Guidance:  Ultrasound and  fluoroscopic MEDICATIONS: 1% lidocaine  local with epinephrine  ANESTHESIA/SEDATION: Versed  1.0 mg IV; Fentanyl  50 mcg IV; Moderate Sedation Time:  26 minutes The patient was continuously monitored during the procedure by the interventional radiology nurse under my direct supervision. FLUOROSCOPY: 0 minutes, 48 seconds (6 mGy) COMPLICATIONS: None immediate. CONTRAST:  None. PROCEDURE: Informed consent was obtained from the patient following explanation of the procedure, risks, benefits and alternatives. The patient understands, agrees and consents for the procedure. All questions were addressed. A time out was performed. Maximal barrier sterile technique utilized including caps, mask, sterile gowns, sterile gloves, large sterile drape, hand hygiene, and 2% chlorhexidine  scrub. Under sterile conditions and local anesthesia, right internal jugular micropuncture venous access was performed. Access was performed with ultrasound. Images were obtained for documentation of the patent right internal jugular vein. A guide wire was inserted followed by a transitional dilator. This allowed insertion of a guide wire and catheter into the IVC. Measurements were obtained from the SVC / RA junction back to the right IJ venotomy site. In the right infraclavicular chest, a subcutaneous pocket was created over the second anterior rib. This was done under sterile conditions and local anesthesia. 1% lidocaine  with epinephrine  was utilized for this. A 2.5 cm incision was made in the skin. Blunt dissection was performed to create a subcutaneous pocket over the right pectoralis major muscle. The pocket was flushed with saline vigorously. There was adequate hemostasis. The port catheter was assembled and checked for leakage. The port catheter was secured in the pocket with two retention sutures. The tubing was tunneled subcutaneously to the right venotomy site and inserted into the SVC/RA junction through a valved peel-away sheath. Position  was confirmed with fluoroscopy. Images were obtained for documentation. The patient tolerated the procedure well. No immediate complications. Incisions were closed in a two layer fashion with 4 - 0 Vicryl suture. Dermabond was applied to the skin. The port catheter was accessed, blood was aspirated followed by saline and heparin  flushes. Needle was removed. A dry sterile dressing was applied. IMPRESSION: Ultrasound and fluoroscopically guided right internal jugular single lumen power port catheter insertion. Tip in the SVC/RA junction. Catheter ready for use. Electronically Signed   By: CHRISTELLA.  Shick M.D.   On: 04/08/2024 14:26   CT ABDOMINAL MASS BIOPSY Result Date: 04/08/2024 INDICATION: LEFT RETROPERITONEAL METASTATIC NODAL MASS EXAM: CT-GUIDED BIOPSY LEFT RETROPERITONEAL NODAL MASS MEDICATIONS: 1% LIDOCAINE  LOCAL ANESTHESIA/SEDATION: 1.0 mg IV Versed ; 50 mcg IV Fentanyl  Moderate Sedation Time:  12 MINUTE The patient was continuously monitored during the procedure by the interventional radiology nurse under my direct supervision. PROCEDURE: The procedure, risks, benefits, and alternatives were explained to the  patient. Questions regarding the procedure were encouraged and answered. The patient understands and consents to the procedure. Previous imaging reviewed. Patient positioned prone. Noncontrast localization CT performed. The left retroperitoneal large nodal mass with central necrosis was localized and marked. Under sterile conditions and local anesthesia, the 17 gauge guide needle was advanced to the peripheral margin of the lesion laterally. Needle position confirmed with CT. 18 gauge core biopsies obtained. These were placed in formalin. Needle removed. Images obtained for documentation. Patient tolerated the procedure well without complication. Vital sign monitoring by nursing staff during the procedure will continue as patient is in the special procedures unit for post procedure observation. FINDINGS: The  images document guide needle placement within the large left retroperitoneal nodal mass. Post biopsy images demonstrate no hemorrhage or hematoma. COMPLICATIONS: None immediate. IMPRESSION: Successful CT-guided core biopsy of the left retroperitoneal nodal mass RADIATION DOSE REDUCTION: This exam was performed according to the departmental dose-optimization program which includes automated exposure control, adjustment of the mA and/or kV according to patient size and/or use of iterative reconstruction technique. Electronically Signed   By: CHRISTELLA.  Shick M.D.   On: 04/08/2024 14:25    Microbiology: Results for orders placed or performed during the hospital encounter of 05/04/24  Culture, blood (Routine X 2) w Reflex to ID Panel     Status: None (Preliminary result)   Collection Time: 05/04/24  7:32 AM   Specimen: BLOOD  Result Value Ref Range Status   Specimen Description   Final    BLOOD BLOOD LEFT ARM Performed at [facility], [address] [place], [place] [address]    Special Requests   Final    BOTTLES DRAWN AEROBIC AND ANAEROBIC Blood Culture adequate volume Performed at [facility], [address], [place], [place] [address]    Culture   Final    NO GROWTH < 24 HOURS Performed at [facility], [address] [place], [place] [address]    Report Status PENDING  Incomplete  Culture, blood (Routine X 2) w Reflex to ID Panel     Status: None (Preliminary result)   Collection Time: 05/04/24  7:32 AM   Specimen: BLOOD  Result Value Ref Range Status   Specimen Description   Final    BLOOD BLOOD LEFT HAND Performed at [facility], [address] [place], [place] [address]    Special Requests   Final    BOTTLES DRAWN AEROBIC AND ANAEROBIC Blood Culture adequate volume Performed at [facility], [address] [place], [place] [address]    Culture   Final    NO GROWTH < 24 HOURS Performed at [facility], [address], [place], [place] [address]    Report Status PENDING  Incomplete  Respiratory (~20 pathogens) panel by PCR     Status: None   Collection Time: 05/04/24  9:35 AM   Specimen: Nasal Mucosa; Respiratory  Result Value Ref Range Status   Adenovirus NOT DETECTED NOT DETECTED Final   Coronavirus 229E NOT DETECTED NOT DETECTED Final    Comment: (NOTE) The Coronavirus on the Respiratory Panel, DOES NOT test for the novel  Coronavirus (2019 nCoV)    Coronavirus HKU1 NOT DETECTED NOT DETECTED Final   Coronavirus NL63 NOT DETECTED NOT DETECTED Final   Coronavirus OC43 NOT DETECTED NOT DETECTED Final   Metapneumovirus NOT DETECTED NOT DETECTED Final   Rhinovirus / Enterovirus NOT DETECTED NOT DETECTED Final   Influenza  A NOT DETECTED NOT DETECTED Final   Influenza B NOT DETECTED NOT DETECTED Final   Parainfluenza Virus 1 NOT DETECTED NOT DETECTED Final   Parainfluenza Virus 2 NOT DETECTED NOT DETECTED Final   Parainfluenza Virus 3 NOT DETECTED NOT DETECTED Final   Parainfluenza Virus 4 NOT DETECTED NOT DETECTED Final   Respiratory Syncytial Virus NOT DETECTED NOT DETECTED Final   Bordetella pertussis NOT DETECTED NOT DETECTED Final   Bordetella Parapertussis NOT DETECTED NOT DETECTED Final   Chlamydophila pneumoniae NOT DETECTED NOT DETECTED Final   Mycoplasma pneumoniae NOT DETECTED NOT DETECTED Final    Comment: Performed at [facility], [address], [place], [place] [address]  MRSA Next Gen by PCR, Nasal     Status: None   Collection Time: 05/04/24  9:35 AM   Specimen: Nasal Mucosa; Nasal Swab  Result Value Ref Range Status   MRSA by PCR Next Gen NOT DETECTED NOT DETECTED Final    Comment: (NOTE) The GeneXpert MRSA Assay (FDA approved for NASAL specimens only), is one component of a comprehensive MRSA colonization surveillance program. It is not intended to diagnose MRSA infection nor to guide or monitor treatment for MRSA infections. Test  performance is not FDA approved in patients less than 36 years old. Performed at [facility], [address], [place], [place] [address]     Labs: CBC: Recent Labs  Lab 05/04/24 0132 05/04/24 0732 05/05/24 0515  WBC 18.3* 18.1* 14.3*  NEUTROABS  --   --  12.9*  HGB 9.2* 8.8* 8.3*  HCT 28.7* 28.5* 26.3*  MCV 84.2 84.3 84.8  PLT 270 200 189   Basic Metabolic Panel: Recent Labs  Lab 05/04/24 0132 05/04/24 0732 05/05/24 0515  NA 132* 134* 135  K 4.0 4.3 4.0  CL 96* 97* 98  CO2 22 24 23   GLUCOSE 147* 177* 216*  BUN 11 10 14   CREATININE 0.48 0.48 0.55  CALCIUM  9.4 9.0 8.6*  MG  --   --  1.6*   Liver Function Tests: Recent Labs  Lab 05/04/24 0132 05/04/24 0732  AST 23 20  ALT 6 9  ALKPHOS 105 103  BILITOT 0.8 0.5  PROT 6.5 6.4*  ALBUMIN 3.2* 3.3*   CBG: Recent Labs  Lab 05/04/24 0749 05/04/24 1146 05/04/24 1646 05/04/24 2057 05/05/24 0710  GLUCAP 180* 259* 266* 243* 179*    Discharge time spent: greater than 30 minutes.  This record has been created using Conservation officer, historic buildings. Errors have been sought and corrected,but may not always be located. Such creation errors do not reflect on the standard of care.   Signed: Amaryllis Dare, MD [facility] 05/05/2024

## 2024-05-06 LAB — LEGIONELLA PNEUMOPHILA SEROGP 1 UR AG
L. pneumophila Serogp 1 Ur Ag: NEGATIVE
Source of Sample: 182246

## 2024-05-07 ENCOUNTER — Inpatient Hospital Stay: Attending: Licensed Clinical Social Worker | Admitting: Licensed Clinical Social Worker

## 2024-05-07 DIAGNOSIS — C541 Malignant neoplasm of endometrium: Secondary | ICD-10-CM | POA: Insufficient documentation

## 2024-05-07 DIAGNOSIS — Z794 Long term (current) use of insulin: Secondary | ICD-10-CM | POA: Insufficient documentation

## 2024-05-07 DIAGNOSIS — G893 Neoplasm related pain (acute) (chronic): Secondary | ICD-10-CM | POA: Insufficient documentation

## 2024-05-07 DIAGNOSIS — E118 Type 2 diabetes mellitus with unspecified complications: Secondary | ICD-10-CM | POA: Insufficient documentation

## 2024-05-07 DIAGNOSIS — R5381 Other malaise: Secondary | ICD-10-CM | POA: Insufficient documentation

## 2024-05-07 NOTE — Progress Notes (Signed)
 [facility] CSW Counseling Note  Patient was referred by self. Treatment type: Individual  Presenting Concerns: Patient and/or family reports the following symptoms/concerns: difficulty adjusting to changes in health status and cancer recurrence Duration of problem: 1 months; Severity of problem: mild   Orientation:oriented to person, place, time/date, and situation.   Affect: Appropriate and Congruent Risk of harm to self or others: No plan to harm self or others  Patient and/or Family's Strengths/Protective Factors: Social connections and Social and Emotional competenceAbility for insight  Religious Affiliation  Supportive family/friends   Patient connected with social worker through [employer] to assist with navigating resources;  Has medication for mood (Prozac  & wellbutrin )   Goals Addressed: Patient will:  Increase knowledge and/or ability of: coping skills related to dealing with major health concerns Increase healthy adjustment to current life circumstances with focus on accepting situations and being able to move forward with day to day living with quality of life Improve ability to grieve over changes and loss and allow others space to grieve   Progress towards Goals: Initial   Interventions: Interventions utilized:  Supportive Counseling and initial mental health assessment  GAD 7 PHQ 9        05/07/2024   10:34 AM 05/01/2024    7:59 AM 09/25/2023   10:52 AM  Depression screen PHQ 2/9  Decreased Interest 0 0 0  Down, Depressed, Hopeless 1 0 0  PHQ - 2 Score 1 0 0  Altered sleeping 2    Tired, decreased energy 2    Change in appetite 2    Feeling bad or failure about yourself  1    Trouble concentrating 1    Moving slowly or fidgety/restless 0    Suicidal thoughts 0    PHQ-9 Score 9    Difficult doing work/chores Somewhat difficult         05/07/2024   10:34 AM 12/25/2021    2:49 PM 11/27/2021   11:35 AM 11/01/2021    3:16 PM  GAD 7 : Generalized Anxiety Score   Nervous, Anxious, on Edge 1 1 2 1   Control/stop worrying 1 2 2 1   Worry too much - different things 0 1 2 1   Trouble relaxing 0 1 2 0  Restless 0 1 2 0  Easily annoyed or irritable 1 1 2  0  Afraid - awful might happen 3 2 2 1   Total GAD 7 Score 6 9 14 4   Anxiety Difficulty Somewhat difficult       Assessment: Patient currently experiencing grief, mild anxiety and depression while adjusting to cancer recurrence and treatment, complicated by two recent hospital stays. Pt is processing frustration that this recurrence was not found sooner even though she had asked about scans as well as grief and adjustment to changing roles and feeling a loss of independence.  Pt has prior history of depression and anxiety and is on medication currently and has had previous counseling.    Values: her faith in Jesus and sharing what that has given her; family & friends- with the aim of helping them break negative cycles of trauma and be as healthy as possible while making memories together.   Plan: Follow up with CSW: 1 week Behavioral recommendations: continue to voice what is helpful for you from others when they are wanting to help Referral(s): message to MD regarding pt request about HHPT       Brenda Rupnow E Latravia [place]gate, LCSW   Patient is participating in a Managed Medicaid Plan:  Yes

## 2024-05-09 LAB — CULTURE, BLOOD (ROUTINE X 2)
Culture: NO GROWTH
Culture: NO GROWTH
Special Requests: ADEQUATE
Special Requests: ADEQUATE

## 2024-05-11 ENCOUNTER — Telehealth: Payer: Self-pay

## 2024-05-11 ENCOUNTER — Inpatient Hospital Stay (HOSPITAL_BASED_OUTPATIENT_CLINIC_OR_DEPARTMENT_OTHER): Payer: Self-pay | Admitting: Hematology and Oncology

## 2024-05-11 ENCOUNTER — Other Ambulatory Visit (HOSPITAL_COMMUNITY): Payer: Self-pay

## 2024-05-11 ENCOUNTER — Telehealth: Payer: Self-pay | Admitting: Licensed Clinical Social Worker

## 2024-05-11 VITALS — BP 99/62 | HR 110 | Temp 97.5°F | Resp 18 | Ht 62.0 in | Wt 182.6 lb

## 2024-05-11 DIAGNOSIS — C541 Malignant neoplasm of endometrium: Secondary | ICD-10-CM

## 2024-05-11 DIAGNOSIS — G893 Neoplasm related pain (acute) (chronic): Secondary | ICD-10-CM

## 2024-05-11 DIAGNOSIS — R5381 Other malaise: Secondary | ICD-10-CM

## 2024-05-11 DIAGNOSIS — E118 Type 2 diabetes mellitus with unspecified complications: Secondary | ICD-10-CM | POA: Diagnosis not present

## 2024-05-11 DIAGNOSIS — Z794 Long term (current) use of insulin: Secondary | ICD-10-CM | POA: Diagnosis not present

## 2024-05-11 MED ORDER — OXYCODONE HCL 10 MG PO TABS
10.0000 mg | ORAL_TABLET | Freq: Four times a day (QID) | ORAL | 0 refills | Status: DC | PRN
  Filled 2024-05-11: qty 60, 15d supply, fill #0

## 2024-05-11 NOTE — Telephone Encounter (Signed)
 LVM regarding today's appointment time. Requested patient to call office back to confirm times.

## 2024-05-11 NOTE — Telephone Encounter (Signed)
 Received confirmation of successful fax transmission of physical therapy referral.

## 2024-05-11 NOTE — Telephone Encounter (Signed)
 CSW sent list of trauma-related books, resources, and therapists to patient as discussed during last visit.   Arlisha Patalano E Quintell Bonnin, LCSW

## 2024-05-12 ENCOUNTER — Encounter: Payer: Self-pay | Admitting: Hematology and Oncology

## 2024-05-12 DIAGNOSIS — R5381 Other malaise: Secondary | ICD-10-CM | POA: Insufficient documentation

## 2024-05-12 NOTE — Assessment & Plan Note (Addendum)
 She will continue dietary modification and medical management

## 2024-05-12 NOTE — Assessment & Plan Note (Addendum)
 Her pain is better controlled She will continue her pain medicine as prescribed

## 2024-05-12 NOTE — Assessment & Plan Note (Addendum)
 She has significant generalized deconditioning I recommend outpatient physical therapy and rehab and will send referral

## 2024-05-12 NOTE — Progress Notes (Signed)
 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  CT imaging of the chest shows spiculated lung mass suspicious for metastatic spread Pathology: Left retroperitoneal mass biopsy confirmed adenocarcinoma, ER positive, p53 wild-type.  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  However, the patient was admitted to the hospital for evaluation of altered mental status/suspected stroke.  All her workup came back negative for stroke but the patient did present with altered mental status likely due to diabetic ketoacidosis MRI of the head was negative After cycle 1 of treatment, she was hospitalized for altered mental status and urinary tract infection She was recovered fully from her recent hospitalization She will resume chemotherapy end of next week Plan to repeat imaging study in November as she just had imaging study done recently Cancer related pain Her pain is better controlled She will continue her pain medicine as prescribed Type 2 diabetes mellitus with complication, with long-term current use of insulin  (HCC) She will continue dietary modification and medical management Physical debility She has significant generalized deconditioning I recommend outpatient physical therapy and rehab and will send referral  Orders Placed This Encounter  Procedures   Ambulatory referral to Physical  Therapy    Referral Priority:   Routine    Referral Type:   Physical Medicine    Referral Reason:   Specialty Services Required    Requested Specialty:   Physical Therapy    Number of Visits Requested:   1     Hicks Lonn, MD  INTERVAL HISTORY: she returns for surveillance follow-up after recent hospitalization She is here accompanied by family She is doing well No further confusion episodes She is eating and drinking well Her pain is slightly better controlled No recent nausea or constipation Overall, she has generalized weakness  PHYSICAL EXAMINATION: ECOG PERFORMANCE STATUS: 2 - Symptomatic, <50% confined to bed  Vitals:   05/11/24 1457  BP: 99/62  Pulse: (!) 110  Resp: 18  Temp: (!) 97.5 F (36.4 C)  SpO2: 98%   Filed Weights   05/11/24 1457  Weight: 182 lb 9.6 oz (82.8 kg)    Relevant data reviewed during this visit included CT imaging from September 2025, recent labs and hospital notes

## 2024-05-12 NOTE — Assessment & Plan Note (Addendum)
 The patient was originally diagnosed with uterine cancer in 2023, status post surgery and adjuvant radiation treatment She was noted to have cancer recurrence in July 2025 when she presented with severe back pain with CT imaging showing recurrent disease in the retroperitoneal lymph node.  CT imaging of the chest shows spiculated lung mass suspicious for metastatic spread Pathology: Left retroperitoneal mass biopsy confirmed adenocarcinoma, ER positive, p53 wild-type.  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  However, the patient was admitted to the hospital for evaluation of altered mental status/suspected stroke.  All her workup came back negative for stroke but the patient did present with altered mental status likely due to diabetic ketoacidosis MRI of the head was negative After cycle 1 of treatment, she was hospitalized for altered mental status and urinary tract infection She was recovered fully from her recent hospitalization She will resume chemotherapy end of next week Plan to repeat imaging study in November as she just had imaging study done recently

## 2024-05-14 ENCOUNTER — Inpatient Hospital Stay: Admitting: Licensed Clinical Social Worker

## 2024-05-14 NOTE — Progress Notes (Signed)
 [facility] CSW Counseling Note  Patient was referred by self. Treatment type: Individual  Presenting Concerns: Patient and/or family reports the following symptoms/concerns: difficulty adjusting to changes in health status and cancer recurrence Duration of problem: 1 months; Severity of problem: mild   Orientation:oriented to person, place, time/date, and situation.   Affect: Appropriate and Congruent Risk of harm to self or others: No plan to harm self or others  Patient and/or Family's Strengths/Protective Factors: Social connections and Social and Emotional competenceAbility for insight  Religious Affiliation  Supportive family/friends   Patient connected with social worker through [employer] to assist with navigating resources;  Has medication for mood (Prozac  & wellbutrin )   Goals Addressed: Patient will:  Increase knowledge and/or ability of: coping skills related to dealing with major health concerns Increase healthy adjustment to current life circumstances with focus on accepting situations and being able to move forward with day to day living with quality of life Improve ability to grieve over changes and loss and allow others space to grieve   Progress towards Goals: Progressing   Interventions: Interventions utilized:  Mindfulness or Relaxation Training and CBT Cognitive Behavioral Therapy  No updated screeners today        05/07/2024   10:34 AM 05/01/2024    7:59 AM 09/25/2023   10:52 AM  Depression screen PHQ 2/9  Decreased Interest 0 0 0  Down, Depressed, Hopeless 1 0 0  PHQ - 2 Score 1 0 0  Altered sleeping 2    Tired, decreased energy 2    Change in appetite 2    Feeling bad or failure about yourself  1    Trouble concentrating 1    Moving slowly or fidgety/restless 0    Suicidal thoughts 0    PHQ-9 Score 9    Difficult doing work/chores Somewhat difficult         05/07/2024   10:34 AM 12/25/2021    2:49 PM 11/27/2021   11:35 AM 11/01/2021    3:16 PM  GAD 7 :  Generalized Anxiety Score  Nervous, Anxious, on Edge 1 1 2 1   Control/stop worrying 1 2 2 1   Worry too much - different things 0 1 2 1   Trouble relaxing 0 1 2 0  Restless 0 1 2 0  Easily annoyed or irritable 1 1 2  0  Afraid - awful might happen 3 2 2 1   Total GAD 7 Score 6 9 14 4   Anxiety Difficulty Somewhat difficult       Assessment: Patient reports improved mood since last visit. She was able to have conversations with her loved ones to clarify about what support is helpful and is able to be more open with her daughters.  Pt is also talking with her mom more and doing intentional time together for dinner with [place], both of which are bringing her joy.  She is planning to go to church and the sensory worship this weekend.  Continued to process feelings around cancer and knowing where and how large the tumors are.  CSW led patient in a color healing meditation today related to chemo and cancer.  Discussed spoon theory in relation to energy and taking each thing one step at a time, breaking it into smaller steps, and putting aside what does not need to be addressed now.    Plan: Follow up with CSW: 2 weeks Behavioral recommendations: Continue building in activities and connection to others that is joyful and energizing for you. Break other tasks or  situations into smaller steps to make them manageable. Use color healing meditation at night and during treatment Referral(s): PT referral from MD       Ilana Prezioso E Halli Equihua, LCSW   Patient is participating in a Managed Medicaid Plan:  Yes

## 2024-05-15 ENCOUNTER — Other Ambulatory Visit

## 2024-05-15 ENCOUNTER — Ambulatory Visit

## 2024-05-15 ENCOUNTER — Other Ambulatory Visit (HOSPITAL_COMMUNITY): Payer: Self-pay

## 2024-05-15 ENCOUNTER — Ambulatory Visit: Admitting: Hematology and Oncology

## 2024-05-15 ENCOUNTER — Encounter: Payer: Self-pay | Admitting: Hematology and Oncology

## 2024-05-19 NOTE — Addendum Note (Signed)
 Encounter addended by: Janice Lynwood BROCKS on: 05/19/2024 10:26 AM  Actions taken: Imaging Exam ended

## 2024-05-20 ENCOUNTER — Encounter: Payer: Self-pay | Admitting: Hematology and Oncology

## 2024-05-21 ENCOUNTER — Other Ambulatory Visit: Payer: Self-pay

## 2024-05-21 ENCOUNTER — Encounter (HOSPITAL_COMMUNITY): Payer: Self-pay

## 2024-05-21 ENCOUNTER — Observation Stay (HOSPITAL_COMMUNITY)
Admission: EM | Admit: 2024-05-21 | Discharge: 2024-05-22 | Disposition: A | Discharge status: Home / Self Care | Attending: Internal Medicine | Admitting: Internal Medicine

## 2024-05-21 ENCOUNTER — Observation Stay (HOSPITAL_COMMUNITY)

## 2024-05-21 DIAGNOSIS — Z79899 Other long term (current) drug therapy: Secondary | ICD-10-CM | POA: Insufficient documentation

## 2024-05-21 DIAGNOSIS — G8929 Other chronic pain: Secondary | ICD-10-CM | POA: Diagnosis not present

## 2024-05-21 DIAGNOSIS — E785 Hyperlipidemia, unspecified: Secondary | ICD-10-CM | POA: Diagnosis not present

## 2024-05-21 DIAGNOSIS — E1110 Type 2 diabetes mellitus with ketoacidosis without coma: Secondary | ICD-10-CM | POA: Diagnosis not present

## 2024-05-21 DIAGNOSIS — J9611 Chronic respiratory failure with hypoxia: Secondary | ICD-10-CM | POA: Diagnosis not present

## 2024-05-21 DIAGNOSIS — N390 Urinary tract infection, site not specified: Secondary | ICD-10-CM | POA: Diagnosis not present

## 2024-05-21 DIAGNOSIS — R739 Hyperglycemia, unspecified: Secondary | ICD-10-CM | POA: Diagnosis present

## 2024-05-21 DIAGNOSIS — G4733 Obstructive sleep apnea (adult) (pediatric): Secondary | ICD-10-CM | POA: Insufficient documentation

## 2024-05-21 DIAGNOSIS — I5032 Chronic diastolic (congestive) heart failure: Secondary | ICD-10-CM | POA: Insufficient documentation

## 2024-05-21 DIAGNOSIS — Z794 Long term (current) use of insulin: Secondary | ICD-10-CM | POA: Diagnosis not present

## 2024-05-21 DIAGNOSIS — C541 Malignant neoplasm of endometrium: Secondary | ICD-10-CM | POA: Insufficient documentation

## 2024-05-21 DIAGNOSIS — I110 Hypertensive heart disease with heart failure: Secondary | ICD-10-CM | POA: Insufficient documentation

## 2024-05-21 DIAGNOSIS — I2510 Atherosclerotic heart disease of native coronary artery without angina pectoris: Secondary | ICD-10-CM | POA: Diagnosis not present

## 2024-05-21 DIAGNOSIS — D649 Anemia, unspecified: Secondary | ICD-10-CM | POA: Insufficient documentation

## 2024-05-21 DIAGNOSIS — I5030 Unspecified diastolic (congestive) heart failure: Secondary | ICD-10-CM | POA: Diagnosis present

## 2024-05-21 DIAGNOSIS — I10 Essential (primary) hypertension: Secondary | ICD-10-CM | POA: Diagnosis not present

## 2024-05-21 LAB — BASIC METABOLIC PANEL WITH GFR
Anion gap: 21 — ABNORMAL HIGH (ref 5–15)
Anion gap: 21 — ABNORMAL HIGH (ref 5–15)
BUN: 11 mg/dL (ref 8–23)
BUN: 9 mg/dL (ref 8–23)
CO2: 18 mmol/L — ABNORMAL LOW (ref 22–32)
CO2: 16 mmol/L — ABNORMAL LOW (ref 22–32)
Calcium: 9.2 mg/dL (ref 8.9–10.3)
Calcium: 8.8 mg/dL — ABNORMAL LOW (ref 8.9–10.3)
Chloride: 93 mmol/L — ABNORMAL LOW (ref 98–111)
Chloride: 97 mmol/L — ABNORMAL LOW (ref 98–111)
Creatinine, Ser: 0.71 mg/dL (ref 0.44–1.00)
Creatinine, Ser: 0.74 mg/dL (ref 0.44–1.00)
GFR, Estimated: >60 mL/min (ref >60)
GFR, Estimated: >60 mL/min (ref >60)
Glucose, Bld: 413 mg/dL — ABNORMAL HIGH (ref 70–99)
Glucose, Bld: 347 mg/dL — ABNORMAL HIGH (ref 70–99)
Potassium: 4.6 mmol/L (ref 3.5–5.1)
Potassium: 4.3 mmol/L (ref 3.5–5.1)
Sodium: 132 mmol/L — ABNORMAL LOW (ref 135–145)
Sodium: 134 mmol/L — ABNORMAL LOW (ref 135–145)

## 2024-05-21 LAB — CBC
HCT: 28.8 % — ABNORMAL LOW (ref 36.0–46.0)
Hemoglobin: 8.4 g/dL — ABNORMAL LOW (ref 12.0–15.0)
MCH: 26 pg (ref 26.0–34.0)
MCHC: 29.2 g/dL — ABNORMAL LOW (ref 30.0–36.0)
MCV: 89.2 fL (ref 80.0–100.0)
Platelets: 124 K/uL — ABNORMAL LOW (ref 150–400)
RBC: 3.23 MIL/uL — ABNORMAL LOW (ref 3.87–5.11)
RDW: 17.6 % — ABNORMAL HIGH (ref 11.5–15.5)
WBC: 10.3 K/uL (ref 4.0–10.5)
nRBC: 0 % (ref 0.0–0.2)

## 2024-05-21 LAB — BETA-HYDROXYBUTYRIC ACID: Beta-Hydroxybutyric Acid: 2.71 mmol/L — ABNORMAL HIGH (ref 0.05–0.27)

## 2024-05-21 LAB — GLUCOSE, CAPILLARY: Glucose-Capillary: 163 mg/dL — ABNORMAL HIGH (ref 70–99)

## 2024-05-21 LAB — BLOOD GAS, VENOUS
Acid-base deficit: 9 mmol/L — ABNORMAL HIGH (ref 0.0–2.0)
Bicarbonate: 16.8 mmol/L — ABNORMAL LOW (ref 20.0–28.0)
O2 Saturation: 71.5 %
Patient temperature: 37
pCO2, Ven: 35 mmHg — ABNORMAL LOW (ref 44–60)
pH, Ven: 7.29 (ref 7.25–7.43)
pO2, Ven: 47 mmHg — ABNORMAL HIGH (ref 32–45)

## 2024-05-21 LAB — CBG MONITORING, ED
Glucose-Capillary: 370 mg/dL — ABNORMAL HIGH (ref 70–99)
Glucose-Capillary: 346 mg/dL — ABNORMAL HIGH (ref 70–99)
Glucose-Capillary: 293 mg/dL — ABNORMAL HIGH (ref 70–99)
Glucose-Capillary: 297 mg/dL — ABNORMAL HIGH (ref 70–99)
Glucose-Capillary: 214 mg/dL — ABNORMAL HIGH (ref 70–99)

## 2024-05-21 MED ORDER — INSULIN ASPART 100 UNIT/ML IJ SOLN
10.0000 [IU] | Freq: Once | INTRAMUSCULAR | Status: AC
  Administered 2024-05-21: 10 [IU] via SUBCUTANEOUS
  Filled 2024-05-21: qty 0.1

## 2024-05-21 MED ORDER — LACTATED RINGERS IV SOLN: INTRAVENOUS | Status: DC

## 2024-05-21 MED ORDER — PREGABALIN 25 MG PO CAPS
50.0000 mg | ORAL_CAPSULE | Freq: Three times a day (TID) | ORAL | Status: DC
  Administered 2024-05-22 (×2): 50 mg via ORAL
  Filled 2024-05-21 (×2): qty 2

## 2024-05-21 MED ORDER — SODIUM CHLORIDE 0.9 % IV BOLUS
1000.0000 mL | Freq: Once | INTRAVENOUS | Status: AC
  Administered 2024-05-21: 1000 mL via INTRAVENOUS

## 2024-05-21 MED ORDER — DEXTROSE IN LACTATED RINGERS 5 % IV SOLN: INTRAVENOUS | Status: DC

## 2024-05-21 MED ORDER — INSULIN REGULAR(HUMAN) IN NACL 100-0.9 UT/100ML-% IV SOLN
INTRAVENOUS | Status: DC
  Administered 2024-05-21: 17 [IU]/h via INTRAVENOUS
  Filled 2024-05-21: qty 100

## 2024-05-21 MED ORDER — LOSARTAN POTASSIUM 50 MG PO TABS
25.0000 mg | ORAL_TABLET | Freq: Every day | ORAL | Status: DC
Start: 2024-05-22 — End: 2024-05-22
  Administered 2024-05-22: 25 mg via ORAL
  Filled 2024-05-21: qty 1

## 2024-05-21 MED ORDER — FLUOXETINE HCL 20 MG PO CAPS
40.0000 mg | ORAL_CAPSULE | Freq: Every day | ORAL | Status: DC
  Administered 2024-05-22: 40 mg via ORAL
  Filled 2024-05-21: qty 2

## 2024-05-21 MED ORDER — BUPROPION HCL ER (XL) 150 MG PO TB24
150.0000 mg | ORAL_TABLET | Freq: Every day | ORAL | Status: DC
  Administered 2024-05-22: 150 mg via ORAL
  Filled 2024-05-21: qty 1

## 2024-05-21 MED ORDER — CYPROHEPTADINE HCL 4 MG PO TABS
4.0000 mg | ORAL_TABLET | Freq: Three times a day (TID) | ORAL | Status: DC
  Administered 2024-05-22 (×2): 4 mg via ORAL
  Filled 2024-05-21 (×3): qty 1

## 2024-05-21 MED ORDER — SENNOSIDES-DOCUSATE SODIUM 8.6-50 MG PO TABS
1.0000 | ORAL_TABLET | Freq: Two times a day (BID) | ORAL | Status: DC | PRN
Start: 2024-05-21 — End: 2024-05-22

## 2024-05-21 MED ORDER — DEXTROSE 50 % IV SOLN: 0.0000 mL | INTRAVENOUS | Status: DC | PRN

## 2024-05-21 MED ORDER — HYDROMORPHONE HCL 1 MG/ML IJ SOLN
1.0000 mg | Freq: Once | INTRAMUSCULAR | Status: AC
  Administered 2024-05-21: 1 mg via INTRAVENOUS
  Filled 2024-05-21: qty 1

## 2024-05-21 MED ORDER — CHLORHEXIDINE GLUCONATE CLOTH 2 % EX PADS
6.0000 | MEDICATED_PAD | Freq: Every day | CUTANEOUS | Status: DC
  Administered 2024-05-21: 6 via TOPICAL

## 2024-05-21 MED ORDER — PRASUGREL HCL 10 MG PO TABS
5.0000 mg | ORAL_TABLET | Freq: Every day | ORAL | Status: DC
  Administered 2024-05-22: 5 mg via ORAL
  Filled 2024-05-21: qty 1

## 2024-05-21 MED ORDER — ENOXAPARIN SODIUM 40 MG/0.4ML IJ SOSY
40.0000 mg | PREFILLED_SYRINGE | INTRAMUSCULAR | Status: DC
  Administered 2024-05-22: 40 mg via SUBCUTANEOUS
  Filled 2024-05-21: qty 0.4

## 2024-05-21 MED ORDER — OXYCODONE HCL 5 MG PO TABS
10.0000 mg | ORAL_TABLET | Freq: Four times a day (QID) | ORAL | Status: DC | PRN
  Administered 2024-05-22 (×2): 10 mg via ORAL
  Filled 2024-05-21 (×2): qty 2

## 2024-05-21 MED ORDER — ATORVASTATIN CALCIUM 40 MG PO TABS: 80.0000 mg | ORAL_TABLET | Freq: Every evening | ORAL | Status: DC

## 2024-05-21 MED ORDER — POTASSIUM CHLORIDE 10 MEQ/100ML IV SOLN
10.0000 meq | INTRAVENOUS | Status: AC
  Administered 2024-05-21 (×2): 10 meq via INTRAVENOUS
  Filled 2024-05-21 (×2): qty 100

## 2024-05-21 MED ORDER — ORAL CARE MOUTH RINSE: 15.0000 mL | OROMUCOSAL | Status: DC | PRN

## 2024-05-21 MED ORDER — INSULIN REGULAR(HUMAN) IN NACL 100-0.9 UT/100ML-% IV SOLN: INTRAVENOUS | Status: DC

## 2024-05-21 MED FILL — Fosaprepitant Dimeglumine For IV Infusion 150 MG (Base Eq): INTRAVENOUS | Qty: 5 | Status: AC

## 2024-05-21 NOTE — ED Notes (Signed)
 I went in and asked pt if she is able to give a urine sample. But pt said she is unable to provide one right now.

## 2024-05-21 NOTE — H&P (Signed)
 History and Physical    Brenda [name] FMW:[id number] DOB: 03-Jun-1961 DOA: 05/21/2024  Patient coming from: Home.  Chief Complaint: Elevated blood sugar.  HPI: Brenda [name] is a 63 y.o. female with history of CAD status post PCI in 2021, diabetes mellitus type 2, hyperlipidemia, chronic respiratory failure on 2 L oxygen , sleep apnea, hypertension, depression, chronic pain who had recent chemotherapy for endometrial carcinoma presents to the ER because of persistently elevated blood sugars.  Patient states her blood sugars been remaining elevated over the last few days.  Denies any nausea vomiting or diarrhea fever or chills.  Patient states long-acting insulin  dose was increased today.  She has been compliant with her medications.  Patient states she also received few doses of steroids for her chemotherapy.  Patient was admitted for altered mental status last month and stroke workup was negative was attributed to DKA at that time.  Patient also was subsequently admitted for pneumonia.  ED Course: In the ER patient blood sugars were in the 400s with anion gap of 21 which persisted even after fluids were given with VBG showing pH of 7.29 with elevated beta-hydroxybutyrate acid patient was started on DKA protocol and IV fluids IV insulin  admitted for further workup.  UA is concerning for UTI started on ceftriaxone .  Review of Systems: As per HPI, rest all negative.   Past Medical History:  Diagnosis Date   Anemia    Arthritis    back, hands, hips (02/22/2015)   CAD (coronary artery disease)    a. complex LAD/diagonal bifurcation PCI in 2010. b. STEMI 10/2019 s/p PTCA/DES x1 to mLAD overlapping the old stent, residual disease treated medicaly.   Cancer [facility])    uterine   CHF (congestive heart failure) (HCC)    Depression    Diabetes mellitus (HCC)    started when I was pregnant; not sure if it was type 1 or type 2    History of radiation therapy    Endometrium- HDR  01/22/22-03/07/22- Dr. Lynwood Nasuti   Hypercholesterolemia    Hypertension    Morbid obesity (HCC)    Myocardial infarction (HCC)    mild x 3   Neuromuscular disorder (HCC)    neuropathy feet   Sleep apnea    cpap/    Past Surgical History:  Procedure Laterality Date   CARDIAC CATHETERIZATION  12/02/2012   CHOLECYSTECTOMY OPEN  09/04/1987   CORONARY ANGIOPLASTY WITH STENT PLACEMENT  09/03/2009   CORONARY/GRAFT ACUTE MI REVASCULARIZATION N/A 10/26/2019   Procedure: CORONARY/GRAFT ACUTE MI REVASCULARIZATION;  Surgeon: Verlin Lonni BIRCH, MD;  Location: [facility] INVASIVE CV LAB;  Service: Cardiovascular;  Laterality: N/A;   INCISIONAL HERNIA REPAIR N/A 12/09/2021   Procedure: LAPAROSCOPIC REPAIR UMBILICAL HERNIA WITH MESH;  Surgeon: Vernetta Berg, MD;  Location: [facility] ORS;  Service: General;  Laterality: N/A;   IR IMAGING GUIDED PORT INSERTION  04/08/2024   LEFT HEART CATH AND CORONARY ANGIOGRAPHY N/A 10/26/2019   Procedure: LEFT HEART CATH AND CORONARY ANGIOGRAPHY;  Surgeon: Verlin Lonni BIRCH, MD;  Location: [facility] INVASIVE CV LAB;  Service: Cardiovascular;  Laterality: N/A;   LEFT HEART CATHETERIZATION WITH CORONARY ANGIOGRAM N/A 12/25/2012   Procedure: LEFT HEART CATHETERIZATION WITH CORONARY ANGIOGRAM;  Surgeon: Ozell Fell, MD;  Location[facility] CATH LAB;  Service: Cardiovascular;  Laterality: N/A;   RADIOLOGY WITH ANESTHESIA N/A 04/21/2024   Procedure: MRI WITH ANESTHESIA;  Surgeon: Radiologist, Medication, MD;  Location: [facility] OR;  Service: Radiology;  Laterality: N/A;  BRAIN W/ W/O   ROBOTIC  ASSISTED TOTAL HYSTERECTOMY WITH BILATERAL SALPINGO OOPHERECTOMY N/A 12/06/2021   Procedure: XI ROBOTIC ASSISTED TOTAL HYSTERECTOMY WITH BILATERAL SALPINGO OOPHORECTOMY;  Surgeon: [name] SAUNDERS, MD;  Location: [facility] ORS;  Service: Gynecology;  Laterality: N/A;   UMBILICAL HERNIA REPAIR  05/04/1989   doesn't think they used mesh     reports that she quit smoking about 37 years ago. Her smoking use  included cigarettes and cigars. She started smoking about 38 years ago. She has a 0.5 pack-year smoking history. She has never used smokeless tobacco. She reports that she does not drink alcohol and does not use drugs.  Allergies  Allergen Reactions   Hydrocodone Shortness Of Breath, Dermatitis and Other (See Comments)    I forget to breathe.   Clopidogrel Rash and Dermatitis    Family History  Problem Relation Age of Onset   Coronary artery disease Mother        in her mid 48s   Hypertension Mother    Cancer Mother        skin   Heart attack Father        in his mid 34s   COPD Father    Stroke Sister    Coronary artery disease Sister    Diabetes Sister    Other Half-Sister    Thyroid  cancer Daughter    Prostate cancer Neg Hx    Colon cancer Neg Hx    Breast cancer Neg Hx    Endometrial cancer Neg Hx    Ovarian cancer Neg Hx    Pancreatic cancer Neg Hx     Prior to Admission medications   Medication Sig Start Date End Date Taking? Authorizing Provider  acetaminophen  (TYLENOL ) 500 MG tablet Take 1-2 tablets (500-1,000 mg total) by mouth every 8 (eight) hours as needed for mild pain (pain score 1-3) or moderate pain (pain score 4-6). Patient taking differently: Take 500 mg by mouth daily as needed for mild pain (pain score 1-3) or moderate pain (pain score 4-6). 03/30/24 03/30/25 Yes Hongalgi, Trenda BIRCH, MD  atorvastatin  (LIPITOR ) 80 MG tablet Take 1 tablet (80 mg total) by mouth every evening. 10/03/21  Yes Newlin, Enobong, MD  buPROPion  (WELLBUTRIN  XL) 150 MG 24 hr tablet Take 150 mg by mouth in the morning.   Yes [provider]  Cholecalciferol  (VITAMIN D3 SUPER STRENGTH) 50 MCG (2000 UT) CAPS Take 2,000 Units by mouth in the morning.   Yes [provider]  cyproheptadine  (PERIACTIN ) 4 MG tablet Take 4 mg by mouth 3 (three) times daily.   Yes [provider]  diphenhydrAMINE  (BENADRYL ) 25 mg capsule Take 25 mg by mouth at bedtime as needed for sleep.    Yes [provider]  FIASP  FLEXTOUCH 100 UNIT/ML FlexTouch Pen Inject 5 Units into the skin with breakfast, with lunch, and with evening meal. 12/08/22  Yes [provider]  FLUoxetine  (PROZAC ) 40 MG capsule Take 40 mg by mouth daily. 01/17/23  Yes [provider]  insulin  degludec (TRESIBA) 100 UNIT/ML FlexTouch Pen Inject 24 Units into the skin every evening.   Yes [provider]  lactulose  (CHRONULAC ) 10 GM/15ML solution Take 45 mLs (30 g total) by mouth 2 (two) times daily as needed for mild constipation or moderate constipation. 05/05/24  Yes Caleen Qualia, MD  lidocaine  (LIDODERM ) 5 % Place 1 patch onto the skin daily. 04/27/24  Yes [provider]  lidocaine -prilocaine  (EMLA ) cream Apply to affected area once Patient taking differently: Apply 1 Application topically as needed (  Port Access). Apply to affected area once 04/16/24  Yes Gorsuch, Ni, MD  losartan  (COZAAR ) 25 MG tablet Take 1 tablet (25 mg total) by mouth daily. 07/19/23  Yes Wonda Sharper, MD  metFORMIN (GLUCOPHAGE-XR) 500 MG 24 hr tablet Take 1,000 mg by mouth in the morning and at bedtime. 07/05/22  Yes [provider]  Misc Natural Products (NEURIVA PO) Take 1 tablet by mouth daily.   Yes [provider]  Misc. Devices MISC Portable oxygen  concentrator, 2L oxygen .  Diagnoses-chronic respiratory failure with hypoxia 11/27/21  Yes Newlin, Enobong, MD  nitroGLYCERIN  (NITROSTAT ) 0.4 MG SL tablet Dissolve 1 tablet under the tongue every 5 minutes as needed for chest pain. Max of 3 doses, then 911. 01/25/23  Yes Weaver, Scott T, PA-C  ondansetron  (ZOFRAN ) 8 MG tablet Take 1 tablet (8 mg total) by mouth every 8 (eight) hours as needed for nausea or vomiting. Start on the third day after carboplatin . 04/16/24  Yes Lonn, Ni, MD  Oxycodone  HCl 10 MG TABS Take 1 tablet (10 mg total) by mouth every 6 (six) hours as needed for severe pain (pain score 7-10) or moderate pain (pain score  4-6). 05/11/24  Yes Gorsuch, Ni, MD  OXYGEN  Inhale 2 L/min into the lungs continuous.   Yes [provider]  polyethylene glycol powder (GLYCOLAX /MIRALAX ) 17 GM/SCOOP powder Take 17 g by mouth daily as needed for mild constipation. 09/17/23  Yes [provider]  prasugrel  (EFFIENT ) 5 MG TABS tablet Take 1 tablet (5 mg total) by mouth in the morning. AFTER TAKING YOUR DOSE ON 04/07/2024, STOP TAKING IT IN PREPARATION FOR THE BIOPSY ON 04/15/2024. AFTER THE BIOPSY, FOLLOW THE RADIOLOGIST RECOMMENDATIONS AS TO TIMING OF RESTARTING THIS MEDICINE. 03/30/24  Yes Hongalgi, Trenda BIRCH, MD  pregabalin  (LYRICA ) 50 MG capsule Take 1 capsule (50 mg total) by mouth 2 (two) times daily. Patient taking differently: Take 50 mg by mouth 3 (three) times daily. 04/24/24  Yes Gonfa, Taye T, MD  prochlorperazine  (COMPAZINE ) 10 MG tablet Take 1 tablet (10 mg total) by mouth every 6 (six) hours as needed for nausea or vomiting. 04/16/24  Yes Gorsuch, Ni, MD  senna-docusate (SENOKOT-S) 8.6-50 MG tablet Take 1-2 tablets by mouth 2 (two) times daily between meals as needed for mild constipation or moderate constipation. Patient taking differently: Take 1-2 tablets by mouth as needed for mild constipation or moderate constipation. 04/24/24  Yes Gonfa, Taye T, MD  furosemide  (LASIX ) 40 MG tablet Take 1 tablet by mouth once daily Patient not taking: Reported on 05/11/2024 02/05/22   Newlin, Enobong, MD  guaiFENesin  (MUCINEX ) 600 MG 12 hr tablet Take 1 tablet (600 mg total) by mouth 2 (two) times daily as needed for cough or to loosen phlegm. 05/05/24   Caleen Qualia, MD    Physical Exam: Constitutional: Moderately built and nourished. Vitals:   05/21/24 2030 05/21/24 2110 05/21/24 2131 05/21/24 2227  BP: (!) 130/58  (!) 119/47 (!) 131/53  Pulse: 88   92  Resp: 17  17 17   Temp:  97.6 F (36.4 C)  98.2 F (36.8 C)  TempSrc:    Oral  SpO2: 98%  98% 97%  Weight:    86.3 kg  Height:    5' 2 (1.575 m)   Eyes: Anicteric no  pallor. ENMT: No discharge from the ears eyes nose or mouth. Neck: No mass felt.  No neck rigidity. Respiratory: No rhonchi or crepitations. Cardiovascular: S1-S2 heard. Abdomen: Soft nontender bowel sound present. Musculoskeletal: No  edema. Skin: No rash. Neurologic: Alert awake oriented to time place and person.  Moves all extremities. Psychiatric: Appears normal.  Normal affect.   Labs on Admission: I have personally reviewed following labs and imaging studies  CBC: Recent Labs  Lab 05/21/24 1438  WBC 10.3  HGB 8.4*  HCT 28.8*  MCV 89.2  PLT 124*   Basic Metabolic Panel: Recent Labs  Lab 05/21/24 1438 05/21/24 1814  NA 132* 134*  K 4.6 4.3  CL 93* 97*  CO2 18* 16*  GLUCOSE 413* 347*  BUN 11 9  CREATININE 0.71 0.74  CALCIUM  9.2 8.8*   GFR: Estimated Creatinine Clearance: 75.3 mL/min (by C-G formula based on SCr of 0.74 mg/dL). Liver Function Tests: No results for input(s): AST, ALT, ALKPHOS, BILITOT, PROT, ALBUMIN in the last 168 hours. No results for input(s): LIPASE, AMYLASE in the last 168 hours. No results for input(s): AMMONIA in the last 168 hours. Coagulation Profile: No results for input(s): INR, PROTIME in the last 168 hours. Cardiac Enzymes: No results for input(s): CKTOTAL, CKMB, CKMBINDEX, TROPONINI in the last 168 hours. BNP (last 3 results) No results for input(s): PROBNP in the last 8760 hours. HbA1C: No results for input(s): HGBA1C in the last 72 hours. CBG: Recent Labs  Lab 05/21/24 1612 05/21/24 1922 05/21/24 2004 05/21/24 2109 05/21/24 2235  GLUCAP 346* 293* 297* 214* 163*   Lipid Profile: No results for input(s): CHOL, HDL, LDLCALC, TRIG, CHOLHDL, LDLDIRECT in the last 72 hours. Thyroid  Function Tests: No results for input(s): TSH, T4TOTAL, FREET4, T3FREE, THYROIDAB in the last 72 hours. Anemia Panel: No results for input(s): VITAMINB12, FOLATE, FERRITIN, TIBC,  IRON, RETICCTPCT in the last 72 hours. Urine analysis:    Component Value Date/Time   COLORURINE YELLOW 05/04/2024 0839   APPEARANCEUR CLEAR 05/04/2024 0839   LABSPEC >1.046 (H) 05/04/2024 0839   PHURINE 6.0 05/04/2024 0839   GLUCOSEU 50 (A) 05/04/2024 0839   HGBUR NEGATIVE 05/04/2024 0839   BILIRUBINUR NEGATIVE 05/04/2024 0839   BILIRUBINUR neg 08/16/2016 1147   KETONESUR 5 (A) 05/04/2024 0839   PROTEINUR 100 (A) 05/04/2024 0839   UROBILINOGEN 2.0 08/16/2016 1147   UROBILINOGEN 2.0 (H) 02/22/2015 2143   NITRITE NEGATIVE 05/04/2024 0839   LEUKOCYTESUR NEGATIVE 05/04/2024 0839   Sepsis Labs: @LABRCNTIP (procalcitonin:4,lacticidven:4) )No results found for this or any previous visit (from the past 240 hours).   Radiological Exams on Admission: No results found.   Assessment/Plan Principal Problem:   DKA (diabetic ketoacidosis) (HCC) Active Problems:   Hyperlipidemia   Essential hypertension   CAD (coronary artery disease)   Obstructive sleep apnea   (HFpEF) heart failure with preserved ejection fraction (HCC)   Endometrial carcinoma (HCC)    DKA -   last hemoglobin A1c was 9.3 a month ago.  Precipitating cause not clear.  Recently was given steroids for chemotherapy.  Patient states she has been compliant with the long-acting insulin .  Patient has been started on DKA protocol with IV insulin  infusion and fluids.  Will transition to long-acting insulin  once anion gap gets corrected. Possible UTI on ceftriaxone . CAD status post PCI on Effient  and statins. Endometrial carcinoma being followed by oncologist has received chemotherapy recently.  Was diagnosed in 2023 status post hysterectomy and adjuvant radiation. Hypertension on ARB. Sleep apnea on CPAP. Chronic respiratory failure on 2 L oxygen . Chronic pain on oxycodone  and Lyrica  doses confirmed on PDMP website. Chronic anemia follow CBC.  Since patient has DKA will need close monitoring and further workup and more  than 2 midnight stay.   DVT prophylaxis: Lovenox . Code Status: Full code. Family Communication: Family at the bedside. Disposition Plan: Stepdown. Consults called: None. Admission status: Observation.

## 2024-05-21 NOTE — ED Provider Notes (Signed)
  Physical Exam  BP (!) 131/54 (BP Location: Left Arm)   Pulse 86   Temp (!) 97.5 F (36.4 C) (Oral)   Resp 17   Ht 5' 2 (1.575 m)   Wt 82.8 kg   LMP 10/30/2014 (Approximate)   SpO2 98%   BMI 33.40 kg/m   Physical Exam  Procedures  Procedures  ED Course / MDM    Medical Decision Making Care assumed at 5 PM.  Patient is here with hyperglycemia.  Patient received IV steroids several weeks ago after chemotherapy.  Her blood sugar has been elevated around 300 for the last 2 days.  Patient also had some vomiting this morning.  Patient had a anion gap of 21 initially.  Signout pending IV fluids and insulin  and repeat chemistry.  7:46 PM Patient's VBG is reviewed.  Patient's pH is 7.29.  Patient received 2 L bolus and also 10 units of subcu insulin .  Patient's repeat BMP showed bicarb of 16 and anion gap remained at 21.  I am concerned that patient may be in early DKA.  Patient is started on IV insulin .  Hospitalist to admit.  Problems Addressed: Diabetic ketoacidosis without coma associated with type 2 diabetes mellitus (HCC): acute illness or injury Hyperglycemia: acute illness or injury  Amount and/or Complexity of Data Reviewed Labs: ordered. Decision-making details documented in ED Course.  Risk Prescription drug management. Decision regarding hospitalization.   CRITICAL CARE Performed by: Alm VEAR Cave   Total critical care time: 39 minutes  Critical care time was exclusive of separately billable procedures and treating other patients.  Critical care was necessary to treat or prevent imminent or life-threatening deterioration.  Critical care was time spent personally by me on the following activities: development of treatment plan with patient and/or surrogate as well as nursing, discussions with consultants, evaluation of patient's response to treatment, examination of patient, obtaining history from patient or surrogate, ordering and performing treatments and  interventions, ordering and review of laboratory studies, ordering and review of radiographic studies, pulse oximetry and re-evaluation of patient's condition.        Cave Alm Macho, MD 05/21/24 [phone]

## 2024-05-21 NOTE — ED Provider Notes (Signed)
 [facility]ERGENCY DEPARTMENT AT [facility] Provider Note   CSN: [id number] Arrival date & time: 05/21/24  1401     Patient presents with: Hyperglycemia   Brenda [name] is a 63 y.o. female.   Patient has a history of hypertension and diabetes and uterine cancer.  She has been getting chemotherapy and they have given her some steroids.  She noticed her glucose has been elevated  The history is provided by the patient and medical records. No language interpreter was used.  Hyperglycemia Severity:  Mild Onset quality:  Sudden Timing:  Constant Progression:  Waxing and waning Diabetes status:  Controlled with insulin  Context: change in medication   Relieved by:  Nothing Ineffective treatments:  None tried Associated symptoms: no abdominal pain, no chest pain and no fatigue        Prior to Admission medications   Medication Sig Start Date End Date Taking? Authorizing Provider  acetaminophen  (TYLENOL ) 500 MG tablet Take 1-2 tablets (500-1,000 mg total) by mouth every 8 (eight) hours as needed for mild pain (pain score 1-3) or moderate pain (pain score 4-6). Patient taking differently: Take 500 mg by mouth daily as needed for mild pain (pain score 1-3) or moderate pain (pain score 4-6). 03/30/24 03/30/25  Judeth Trenda BIRCH, MD  atorvastatin  (LIPITOR ) 80 MG tablet Take 1 tablet (80 mg total) by mouth every evening. 10/03/21   Newlin, Enobong, MD  buPROPion  (WELLBUTRIN  XL) 150 MG 24 hr tablet Take 150 mg by mouth in the morning.    [provider]  Cholecalciferol  (VITAMIN D3 SUPER STRENGTH) 50 MCG (2000 UT) CAPS Take 2,000 Units by mouth in the morning.    [provider]  cyproheptadine  (PERIACTIN ) 4 MG tablet Take 4 mg by mouth 3 (three) times daily.    [provider]  diphenhydrAMINE  (BENADRYL ) 25 mg capsule Take 25 mg by mouth at bedtime as needed for sleep.    [provider]  FIASP  FLEXTOUCH 100 UNIT/ML FlexTouch Pen Inject  5-8 Units into the skin See admin instructions. Inject 5 units into the skin before breakfast & lunch and 8 units before supper/evening meal 12/08/22   [provider]  FLUoxetine  (PROZAC ) 40 MG capsule Take 40 mg by mouth daily. 01/17/23   [provider]  furosemide  (LASIX ) 40 MG tablet Take 1 tablet by mouth once daily Patient not taking: Reported on 05/11/2024 02/05/22   Newlin, Enobong, MD  guaiFENesin  (MUCINEX ) 600 MG 12 hr tablet Take 1 tablet (600 mg total) by mouth 2 (two) times daily as needed for cough or to loosen phlegm. 05/05/24   Amin, Sumayya, MD  insulin  degludec (TRESIBA) 100 UNIT/ML FlexTouch Pen Inject 20 Units into the skin every evening.    [provider]  lactulose  (CHRONULAC ) 10 GM/15ML solution Take 45 mLs (30 g total) by mouth 2 (two) times daily as needed for mild constipation or moderate constipation. 05/05/24   Caleen Qualia, MD  lidocaine  (LIDODERM ) 5 % Place 1 patch onto the skin daily. 04/27/24   [provider]  lidocaine -prilocaine  (EMLA ) cream Apply to affected area once 04/16/24   Lonn Hicks, MD  losartan  (COZAAR ) 25 MG tablet Take 1 tablet (25 mg total) by mouth daily. 07/19/23   Cooper, Michael, MD  metFORMIN (GLUCOPHAGE-XR) 500 MG 24 hr tablet Take 1,000 mg by mouth in the morning and at bedtime. 07/05/22   [provider]  Misc Natural Products (NEURIVA PO) Take 1 tablet by mouth daily.  [provider]  Misc. Devices MISC Portable oxygen  concentrator, 2L oxygen .  Diagnoses-chronic respiratory failure with hypoxia 11/27/21   Delbert Clam, MD  nitroGLYCERIN  (NITROSTAT ) 0.4 MG SL tablet Dissolve 1 tablet under the tongue every 5 minutes as needed for chest pain. Max of 3 doses, then 911. 01/25/23   Lelon Hamilton T, PA-C  ondansetron  (ZOFRAN ) 8 MG tablet Take 1 tablet (8 mg total) by mouth every 8 (eight) hours as needed for nausea or vomiting. Start on the third day after carboplatin . 04/16/24   Lonn Hicks, MD   Oxycodone  HCl 10 MG TABS Take 1 tablet (10 mg total) by mouth every 6 (six) hours as needed for severe pain (pain score 7-10) or moderate pain (pain score 4-6). 05/11/24   Lonn Hicks, MD  OXYGEN  Inhale 2 L/min into the lungs continuous.    [provider]  polyethylene glycol powder (GLYCOLAX /MIRALAX ) 17 GM/SCOOP powder Take 17 g by mouth daily as needed for mild constipation. 09/17/23   [provider]  prasugrel  (EFFIENT ) 5 MG TABS tablet Take 1 tablet (5 mg total) by mouth in the morning. AFTER TAKING YOUR DOSE ON 04/07/2024, STOP TAKING IT IN PREPARATION FOR THE BIOPSY ON 04/15/2024. AFTER THE BIOPSY, FOLLOW THE RADIOLOGIST RECOMMENDATIONS AS TO TIMING OF RESTARTING THIS MEDICINE. 03/30/24   Judeth Trenda BIRCH, MD  pregabalin  (LYRICA ) 50 MG capsule Take 1 capsule (50 mg total) by mouth 2 (two) times daily. 04/24/24   Gonfa, Taye T, MD  prochlorperazine  (COMPAZINE ) 10 MG tablet Take 1 tablet (10 mg total) by mouth every 6 (six) hours as needed for nausea or vomiting. 04/16/24   Lonn Hicks, MD  senna-docusate (SENOKOT-S) 8.6-50 MG tablet Take 1-2 tablets by mouth 2 (two) times daily between meals as needed for mild constipation or moderate constipation. Patient taking differently: Take 1-2 tablets by mouth as needed for mild constipation or moderate constipation. 04/24/24   Gonfa, Taye T, MD    Allergies: Hydrocodone and Clopidogrel    Review of Systems  Constitutional:  Negative for appetite change and fatigue.  HENT:  Negative for congestion, ear discharge and sinus pressure.   Eyes:  Negative for discharge.  Respiratory:  Negative for cough.   Cardiovascular:  Negative for chest pain.  Gastrointestinal:  Negative for abdominal pain and diarrhea.  Genitourinary:  Negative for frequency and hematuria.  Musculoskeletal:  Negative for back pain.  Skin:  Negative for rash.  Neurological:  Negative for seizures and headaches.  Psychiatric/Behavioral:  Negative for hallucinations.      Updated Vital Signs BP (!) 107/47 (BP Location: Left Arm)   Pulse 96   Temp 97.8 F (36.6 C) (Oral)   Resp 16   Ht 5' 2 (1.575 m)   Wt 82.8 kg   LMP 10/30/2014 (Approximate)   SpO2 100%   BMI 33.40 kg/m   Physical Exam Vitals and nursing note reviewed.  Constitutional:      Appearance: She is well-developed.  HENT:     Head: Normocephalic.     Nose: Nose normal.  Eyes:     General: No scleral icterus.    Conjunctiva/sclera: Conjunctivae normal.  Neck:     Thyroid : No thyromegaly.  Cardiovascular:     Rate and Rhythm: Normal rate and regular rhythm.     Heart sounds: No murmur heard.    No friction rub. No gallop.  Pulmonary:     Breath sounds: No stridor. No wheezing or rales.  Chest:     Chest wall:  No tenderness.  Abdominal:     General: There is no distension.     Tenderness: There is no abdominal tenderness. There is no rebound.  Musculoskeletal:        General: Normal range of motion.     Cervical back: Neck supple.  Lymphadenopathy:     Cervical: No cervical adenopathy.  Skin:    Findings: No erythema or rash.  Neurological:     Mental Status: She is alert and oriented to person, place, and time.     Motor: No abnormal muscle tone.     Coordination: Coordination normal.  Psychiatric:        Behavior: Behavior normal.     (all labs ordered are listed, but only abnormal results are displayed) Labs Reviewed  CBC - Abnormal; Notable for the following components:      Result Value   RBC 3.23 (*)    Hemoglobin 8.4 (*)    HCT 28.8 (*)    MCHC 29.2 (*)    RDW 17.6 (*)    Platelets 124 (*)    All other components within normal limits  BASIC METABOLIC PANEL WITH GFR - Abnormal; Notable for the following components:   Sodium 132 (*)    Chloride 93 (*)    CO2 18 (*)    Glucose, Bld 413 (*)    Anion gap 21 (*)    All other components within normal limits  CBG MONITORING, ED - Abnormal; Notable for the following components:   Glucose-Capillary 370  (*)    All other components within normal limits  CBG MONITORING, ED - Abnormal; Notable for the following components:   Glucose-Capillary 346 (*)    All other components within normal limits    EKG: None  Radiology: No results found.   Procedures   Medications Ordered in the ED  sodium chloride  0.9 % bolus 1,000 mL (0 mLs Intravenous Stopped 05/21/24 1500)  insulin  aspart (novoLOG ) injection 10 Units (10 Units Subcutaneous Given 05/21/24 1638)  sodium chloride  0.9 % bolus 1,000 mL (1,000 mLs Intravenous New Bag/Given 05/21/24 1645)                                    Medical Decision Making Amount and/or Complexity of Data Reviewed Labs: ordered.  Risk Prescription drug management.   Patient with hyperglycemia from steroid use.  Disposition will be determined by Dr. Patt     Final diagnoses:  None    ED Discharge Orders     None          Suzette Pac, MD 05/21/24 [phone]

## 2024-05-21 NOTE — Discharge Instructions (Addendum)
 Increase your nighttime dose of insulin  to 24 units and follow-up with your doctor next week

## 2024-05-21 NOTE — ED Triage Notes (Signed)
 Pt bib EMS from home. Hyperglycemia throughout the day today. Ca pt and had first round of chemo 3 weeks ago and was prescribed a steroid after chemo. Past 4 days increased migraines. Denies any other symptoms 126/76 BP 94HR 20 RR 100% RA CBG 428

## 2024-05-21 NOTE — ED Notes (Signed)
Pt unable to provide urine

## 2024-05-22 ENCOUNTER — Inpatient Hospital Stay: Admitting: Hematology and Oncology

## 2024-05-22 ENCOUNTER — Inpatient Hospital Stay

## 2024-05-22 ENCOUNTER — Ambulatory Visit

## 2024-05-22 ENCOUNTER — Other Ambulatory Visit (HOSPITAL_COMMUNITY): Payer: Self-pay

## 2024-05-22 DIAGNOSIS — D649 Anemia, unspecified: Secondary | ICD-10-CM | POA: Diagnosis not present

## 2024-05-22 DIAGNOSIS — T451X5A Adverse effect of antineoplastic and immunosuppressive drugs, initial encounter: Secondary | ICD-10-CM

## 2024-05-22 DIAGNOSIS — I2510 Atherosclerotic heart disease of native coronary artery without angina pectoris: Secondary | ICD-10-CM | POA: Diagnosis not present

## 2024-05-22 DIAGNOSIS — E1110 Type 2 diabetes mellitus with ketoacidosis without coma: Secondary | ICD-10-CM | POA: Diagnosis not present

## 2024-05-22 DIAGNOSIS — D6481 Anemia due to antineoplastic chemotherapy: Secondary | ICD-10-CM

## 2024-05-22 DIAGNOSIS — I5032 Chronic diastolic (congestive) heart failure: Secondary | ICD-10-CM | POA: Diagnosis not present

## 2024-05-22 DIAGNOSIS — C541 Malignant neoplasm of endometrium: Secondary | ICD-10-CM | POA: Diagnosis not present

## 2024-05-22 DIAGNOSIS — E785 Hyperlipidemia, unspecified: Secondary | ICD-10-CM

## 2024-05-22 LAB — URINALYSIS, W/ REFLEX TO CULTURE (INFECTION SUSPECTED)
Bilirubin Urine: NEGATIVE
Glucose, UA: 150 mg/dL — AB
Hgb urine dipstick: NEGATIVE
Ketones, ur: 20 mg/dL — AB
Nitrite: NEGATIVE
Protein, ur: NEGATIVE mg/dL
Specific Gravity, Urine: 1.009 (ref 1.005–1.030)
pH: 5 (ref 5.0–8.0)

## 2024-05-22 LAB — HEPATIC FUNCTION PANEL
ALT: 7 U/L (ref 0–44)
AST: 14 U/L — ABNORMAL LOW (ref 15–41)
Albumin: 2.8 g/dL — ABNORMAL LOW (ref 3.5–5.0)
Alkaline Phosphatase: 92 U/L (ref 38–126)
Bilirubin, Direct: 0.1 mg/dL (ref 0.0–0.2)
Indirect Bilirubin: 0.1 mg/dL — ABNORMAL LOW (ref 0.3–0.9)
Total Bilirubin: 0.3 mg/dL (ref 0.0–1.2)
Total Protein: 5.2 g/dL — ABNORMAL LOW (ref 6.5–8.1)

## 2024-05-22 LAB — BASIC METABOLIC PANEL WITH GFR
Anion gap: 10 (ref 5–15)
BUN: 8 mg/dL (ref 8–23)
CO2: 23 mmol/L (ref 22–32)
Calcium: 8.5 mg/dL — ABNORMAL LOW (ref 8.9–10.3)
Chloride: 101 mmol/L (ref 98–111)
Creatinine, Ser: 0.49 mg/dL (ref 0.44–1.00)
GFR, Estimated: >60 mL/min (ref >60)
Glucose, Bld: 124 mg/dL — ABNORMAL HIGH (ref 70–99)
Potassium: 3.9 mmol/L (ref 3.5–5.1)
Sodium: 135 mmol/L (ref 135–145)

## 2024-05-22 LAB — CBC
HCT: 25.6 % — ABNORMAL LOW (ref 36.0–46.0)
Hemoglobin: 7.6 g/dL — ABNORMAL LOW (ref 12.0–15.0)
MCH: 26.2 pg (ref 26.0–34.0)
MCHC: 29.7 g/dL — ABNORMAL LOW (ref 30.0–36.0)
MCV: 88.3 fL (ref 80.0–100.0)
Platelets: 115 K/uL — ABNORMAL LOW (ref 150–400)
RBC: 2.9 MIL/uL — ABNORMAL LOW (ref 3.87–5.11)
RDW: 17.9 % — ABNORMAL HIGH (ref 11.5–15.5)
WBC: 9 K/uL (ref 4.0–10.5)
nRBC: 0 % (ref 0.0–0.2)

## 2024-05-22 LAB — MRSA NEXT GEN BY PCR, NASAL: MRSA by PCR Next Gen: NOT DETECTED

## 2024-05-22 LAB — GLUCOSE, CAPILLARY
Glucose-Capillary: 117 mg/dL — ABNORMAL HIGH (ref 70–99)
Glucose-Capillary: 119 mg/dL — ABNORMAL HIGH (ref 70–99)
Glucose-Capillary: 119 mg/dL — ABNORMAL HIGH (ref 70–99)
Glucose-Capillary: 119 mg/dL — ABNORMAL HIGH (ref 70–99)
Glucose-Capillary: 136 mg/dL — ABNORMAL HIGH (ref 70–99)
Glucose-Capillary: 165 mg/dL — ABNORMAL HIGH (ref 70–99)

## 2024-05-22 LAB — TROPONIN T, HIGH SENSITIVITY
Troponin T High Sensitivity: 105 ng/L (ref 0–19)
Troponin T High Sensitivity: 122 ng/L (ref 0–19)

## 2024-05-22 LAB — BETA-HYDROXYBUTYRIC ACID: Beta-Hydroxybutyric Acid: 0.08 mmol/L (ref 0.05–0.27)

## 2024-05-22 LAB — PRO BRAIN NATRIURETIC PEPTIDE: Pro Brain Natriuretic Peptide: 3514 pg/mL — ABNORMAL HIGH (ref <300.0)

## 2024-05-22 MED ORDER — INSULIN ASPART 100 UNIT/ML IJ SOLN: 0.0000 [IU] | Freq: Every day | INTRAMUSCULAR | Status: DC

## 2024-05-22 MED ORDER — CEPHALEXIN 500 MG PO CAPS
500.0000 mg | ORAL_CAPSULE | Freq: Four times a day (QID) | ORAL | 0 refills | Status: AC
  Filled 2024-05-22: qty 8, 2d supply, fill #0

## 2024-05-22 MED ORDER — INSULIN ASPART 100 UNIT/ML IJ SOLN
0.0000 [IU] | Freq: Three times a day (TID) | INTRAMUSCULAR | Status: DC
  Administered 2024-05-22: 2 [IU] via SUBCUTANEOUS
  Administered 2024-05-22: 1 [IU] via SUBCUTANEOUS

## 2024-05-22 MED ORDER — HEPARIN SOD (PORK) LOCK FLUSH 100 UNIT/ML IV SOLN
500.0000 [IU] | INTRAVENOUS | Status: AC | PRN
  Administered 2024-05-22: 500 [IU]

## 2024-05-22 MED ORDER — INSULIN GLARGINE 100 UNIT/ML ~~LOC~~ SOLN
24.0000 [IU] | Freq: Every day | SUBCUTANEOUS | Status: DC
  Administered 2024-05-22: 24 [IU] via SUBCUTANEOUS
  Filled 2024-05-22: qty 0.24

## 2024-05-22 MED ORDER — SODIUM CHLORIDE 0.9 % IV SOLN
1.0000 g | INTRAVENOUS | Status: DC
  Administered 2024-05-22: 1 g via INTRAVENOUS
  Filled 2024-05-22: qty 10

## 2024-05-22 NOTE — Progress Notes (Signed)
   05/22/24 1321  TOC Brief Assessment  Insurance and Status Reviewed  Patient has primary care physician Yes  Home environment has been reviewed Single family home  Prior level of function: Independent  Prior/Current Home Services No current home services  Social Drivers of Health Review SDOH reviewed no interventions necessary  Readmission risk has been reviewed Yes  Transition of care needs no transition of care needs at this time   Pt screened. ICM consulted for Medication assistance needs, due to pt being insured there is no medication assistance available at this time. There are no there ICM needs at this time.

## 2024-05-22 NOTE — Discharge Summary (Signed)
 Physician Discharge Summary  Brenda [name] FMW:[id number] DOB: 1961-08-20 DOA: 05/21/2024  PCP: Lonn Hicks, MD  Admit date: 05/21/2024 Discharge date: 05/22/2024  Admitted From: Home Disposition: Home  Recommendations for Outpatient Follow-up:  Follow up with PCP in 1-2 weeks Plan for follow-up later today with oncology office:  Home Health: None Equipment/Devices: None  Discharge Condition: Stable CODE STATUS: Full Diet recommendation: Regular diet as tolerated  Brief/Interim Summary: Brenda [name] is a 63 y.o. female with history of CAD status post PCI in 2021, diabetes mellitus type 2, hyperlipidemia, chronic respiratory failure on 2 L oxygen , sleep apnea, hypertension, depression, chronic pain who had recent chemotherapy for endometrial carcinoma presents to the ER because of persistently elevated glucose.  Patient admitted as above with diagnosis of DKA given anion gap, hyperglycemia and symptoms.  Given patient's recent steroid administration as well as potential UTI thought to be etiology of her hyperglycemia which now is improving with supportive care.  Continue treatment of UTI with antibiotics as below, if patient continues to require steroids for ongoing treatment with oncology would likely recommend low-dose long-acting insulin  in the interim.  Given her resolution here and no future no new plans for ongoing steroid use we will hold off on any insulin  at this time.  Patient continues to monitor her glucose via continuous glucose monitor and otherwise appears to be quite stable at this point.  We also discussed that given her frequency of UTIs over the past few weeks she may benefit from prophylactic antibiotic therapy if this becomes a routine and recurrent issue.  We would defer to her primary team as well as oncology in regards to ongoing antibiotics.  Patient also complains of ongoing migraine which she associates with her chemotherapy.  We discussed possible  prophylactics as well for her migraines but would defer to oncology to evaluate which medications would be indicated for chemo induced migraines and less likely to interact with her current regimen.  Discharge Diagnoses:  Principal Problem:   DKA (diabetic ketoacidosis) (HCC) Active Problems:   Hyperlipidemia   Essential hypertension   CAD (coronary artery disease)   Obstructive sleep apnea   (HFpEF) heart failure with preserved ejection fraction (HCC)   Anemia   Endometrial carcinoma (HCC)   DKA, resolved - Likely secondary to recent steroid use and infection with UTI - Well-controlled prior to this infection and last infection, would not change or adjust her current regimen unless steroids are going to be an ongoing regimen for her  UTI, recurrent - continue Keflex .  Consider prophylactic antibiotics if UTI recurs  CAD status post PCI on Effient  and statins.  Minimally elevated troponin and BNP, patient appears euvolemic and does not have any chest pain, EKG unremarkable for ST elevations  Endometrial carcinoma being followed by oncologist has received chemotherapy recently.  Was diagnosed in 2023 status post hysterectomy and adjuvant radiation.  Hypertension on ARB. Sleep apnea on CPAP. Chronic respiratory failure on 2 L oxygen  Chronic pain on oxycodone  and Lyrica  Chronic anemia follow CBC.   Discharge Instructions  Discharge Instructions     Call MD for:  difficulty breathing, headache or visual disturbances   Complete by: As directed    Call MD for:  extreme fatigue   Complete by: As directed    Call MD for:  hives   Complete by: As directed    Call MD for:  persistant dizziness or light-headedness   Complete by: As directed    Call MD for:  persistant nausea  and vomiting   Complete by: As directed    Call MD for:  severe uncontrolled pain   Complete by: As directed    Call MD for:  temperature >100.4   Complete by: As directed    Diet - low sodium heart healthy    Complete by: As directed    Increase activity slowly   Complete by: As directed       Allergies as of 05/22/2024       Reactions   Hydrocodone Shortness Of Breath, Dermatitis, Other (See Comments)   I forget to breathe.   Clopidogrel Rash, Dermatitis        Medication List     STOP taking these medications    furosemide  40 MG tablet Commonly known as: LASIX        TAKE these medications    acetaminophen  500 MG tablet Commonly known as: TYLENOL  Take 1-2 tablets (500-1,000 mg total) by mouth every 8 (eight) hours as needed for mild pain (pain score 1-3) or moderate pain (pain score 4-6). What changed:  how much to take when to take this   atorvastatin  80 MG tablet Commonly known as: LIPITOR  Take 1 tablet (80 mg total) by mouth every evening.   buPROPion  150 MG 24 hr tablet Commonly known as: WELLBUTRIN  XL Take 150 mg by mouth in the morning.   cephALEXin  500 MG capsule Commonly known as: KEFLEX  Take 1 capsule (500 mg total) by mouth 4 (four) times daily for 2 days.   cyproheptadine  4 MG tablet Commonly known as: PERIACTIN  Take 4 mg by mouth 3 (three) times daily.   diphenhydrAMINE  25 mg capsule Commonly known as: BENADRYL  Take 25 mg by mouth at bedtime as needed for sleep.   Fiasp  FlexTouch 100 UNIT/ML FlexTouch Pen Generic drug: insulin  aspart Inject 5 Units into the skin with breakfast, with lunch, and with evening meal.   FLUoxetine  40 MG capsule Commonly known as: PROZAC  Take 40 mg by mouth daily.   guaiFENesin  600 MG 12 hr tablet Commonly known as: MUCINEX  Take 1 tablet (600 mg total) by mouth 2 (two) times daily as needed for cough or to loosen phlegm.   insulin  degludec 100 UNIT/ML FlexTouch Pen Commonly known as: TRESIBA Inject 24 Units into the skin every evening.   lactulose  10 GM/15ML solution Commonly known as: CHRONULAC  Take 45 mLs (30 g total) by mouth 2 (two) times daily as needed for mild constipation or moderate  constipation.   lidocaine  5 % Commonly known as: LIDODERM  Place 1 patch onto the skin daily.   lidocaine -prilocaine  cream Commonly known as: EMLA  Apply to affected area once What changed:  how much to take how to take this when to take this reasons to take this   losartan  25 MG tablet Commonly known as: COZAAR  Take 1 tablet (25 mg total) by mouth daily.   metFORMIN 500 MG 24 hr tablet Commonly known as: GLUCOPHAGE-XR Take 1,000 mg by mouth in the morning and at bedtime.   Misc. Devices Misc Portable oxygen  concentrator, 2L oxygen .  Diagnoses-chronic respiratory failure with hypoxia   NEURIVA PO Take 1 tablet by mouth daily.   nitroGLYCERIN  0.4 MG SL tablet Commonly known as: NITROSTAT  Dissolve 1 tablet under the tongue every 5 minutes as needed for chest pain. Max of 3 doses, then 911.   ondansetron  8 MG tablet Commonly known as: ZOFRAN  Take 1 tablet (8 mg total) by mouth every 8 (eight) hours as needed for nausea or vomiting. Start on the third  day after carboplatin .   Oxycodone  HCl 10 MG Tabs Take 1 tablet (10 mg total) by mouth every 6 (six) hours as needed for severe pain (pain score 7-10) or moderate pain (pain score 4-6).   OXYGEN  Inhale 2 L/min into the lungs continuous.   polyethylene glycol powder 17 GM/SCOOP powder Commonly known as: GLYCOLAX /MIRALAX  Take 17 g by mouth daily as needed for mild constipation.   prasugrel  5 MG Tabs tablet Commonly known as: Effient  Take 1 tablet (5 mg total) by mouth in the morning. AFTER TAKING YOUR DOSE ON 04/07/2024, STOP TAKING IT IN PREPARATION FOR THE BIOPSY ON 04/15/2024. AFTER THE BIOPSY, FOLLOW THE RADIOLOGIST RECOMMENDATIONS AS TO TIMING OF RESTARTING THIS MEDICINE.   pregabalin  50 MG capsule Commonly known as: LYRICA  Take 1 capsule (50 mg total) by mouth 2 (two) times daily. What changed: when to take this   prochlorperazine  10 MG tablet Commonly known as: COMPAZINE  Take 1 tablet (10 mg total) by mouth every 6  (six) hours as needed for nausea or vomiting.   senna-docusate 8.6-50 MG tablet Commonly known as: Senokot-S Take 1-2 tablets by mouth 2 (two) times daily between meals as needed for mild constipation or moderate constipation. What changed: when to take this   Vitamin D3 Super Strength 50 MCG (2000 UT) Caps Generic drug: Cholecalciferol  Take 2,000 Units by mouth in the morning.        Allergies  Allergen Reactions   Hydrocodone Shortness Of Breath, Dermatitis and Other (See Comments)    I forget to breathe.   Clopidogrel Rash and Dermatitis    Consultations: None   Procedures/Studies: DG CHEST PORT 1 VIEW Result Date: 05/21/2024 CLINICAL DATA:  10031 Cough 10031 EXAM: PORTABLE CHEST 1 VIEW COMPARISON:  Chest x-ray 05/04/2024, CT abdomen pelvis 05/04/2024 FINDINGS: Right chest wall accessed Port-A-Cath with tip overlying the expected region of the superior collection. The heart and mediastinal contours are unchanged. Persistent elevation of the right hemidiaphragm. No focal consolidation. No pulmonary edema. No pleural effusion. No pneumothorax. No acute osseous abnormality. IMPRESSION: No active disease. Electronically Signed   By: Morgane  Naveau M.D.   On: 05/21/2024 23:50   CT ABDOMEN PELVIS W CONTRAST Result Date: 05/04/2024 CLINICAL DATA:  63 year old female with abdominal pain. Metastatic endometrial cancer. * Tracking Code: BO * EXAM: CT ABDOMEN AND PELVIS WITH CONTRAST TECHNIQUE: Multidetector CT imaging of the abdomen and pelvis was performed using the standard protocol following bolus administration of intravenous contrast. RADIATION DOSE REDUCTION: This exam was performed according to the departmental dose-optimization program which includes automated exposure control, adjustment of the mA and/or kV according to patient size and/or use of iterative reconstruction technique. CONTRAST:  OMNIPAQUE  IOHEXOL  300 MG/ML  SOLN COMPARISON:  CT Abdomen and Pelvis 03/29/2024.  FINDINGS: Lower chest: Stable elevation of the right hemidiaphragm. Borderline to mild cardiomegaly is stable. No pericardial effusion. Trace layering left pleural effusion is new since July. And there is confluent new posterior basal segment left lower lobe peribronchial airspace opacity with air bronchograms (series 6, image 51). The visible airways remain patent. No suspicious lung base nodule or discrete mass. Hepatobiliary: Chronically absent gallbladder and stable prominence of intra- and extrahepatic biliary ducts since July. No discrete liver mass. Pancreas: Stable atrophy. Spleen: Stable and negative. Adrenals/Urinary Tract: Mass effect on the left kidney from bulky, which left abdominal retroperitoneal, paraspinal, pararenal space mass is further detailed below. Invasion of the left pararenal space as before. Involvement of the left main renal vessels also (series 2,  image 38). But renal enhancement remain symmetric. Renal contrast excretion is symmetric. The left ureter is located along the anterior margin of the large tumor and is nondilated. Adrenal glands remain within normal limits. Bladder is negative. Stomach/Bowel: Nondilated large and small bowel. Redundant sigmoid colon. Mild large bowel retained stool. Cecum partially located in the pelvis. Decompressed terminal ileum. Normal appendix on series 2, image 73. Small fat containing ventral abdominal hernia near the midline on series 2, image 67 is stable. No herniated bowel. Stomach and duodenum appear negative. No pneumoperitoneum. No free fluid. Vascular/Lymphatic: Large and heterogeneously enhancing nearly 10 cm left abdominal retroperitoneal, paraspinal, pararenal space mass. This may be severe ex nodal tumor, and has progressed since July which is further detailed below. That tumor is inseparable from the infrarenal abdominal aorta which remains patent on series 2, image 44. Tumor extends to the aortoiliac bifurcation which is patent.  Underlying mild for age Aortoiliac calcified atherosclerosis. More advanced calcified femoral artery atherosclerosis. Major arterial branches remain patent. Portal venous system and IVC also appear patent. Left external iliac lymphadenopathy is stable measuring up to 18 mm short axis. No other lymphadenopathy. Reproductive: Surgically absent. Other: No pelvis free fluid. 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 But subtle if any interval erosion into the adjacent L2 and L3 vertebral bodies (perhaps on series 2, image 41). No discrete bone metastasis by CT. Body wall edema not significantly changed since July. IMPRESSION: 1. Very large and progressed since July Left abdominal tumor occupying and invading the lumbar paraspinal, retroperitoneal, para-aortic, and pararenal spaces. Tumor is now up to 9.5 cm transaxial, directly involves the left renal vessels and abuts the aorta - which remain patent. Early if any invasion into the adjacent L2 and L3 lumbar vertebrae. 2. Confluent new Left lower lobe posterior basal segment peribronchial consolidation, more suspicious for Pneumonia or Aspiration than metastatic disease. Small new layering left pleural effusion. 3. Stable left external iliac lymphadenopathy. No brand new metastasis is identified in the abdomen or pelvis. Electronically Signed   By: VEAR Hurst M.D.   On: 05/04/2024 04:04   DG Chest Port 1 View Result Date: 05/04/2024 CLINICAL DATA:  Dyspnea EXAM: PORTABLE CHEST 1 VIEW COMPARISON:  04/20/2024 FINDINGS: Stable elevation of the right hemidiaphragm. Lungs are clear. No pneumothorax or pleural effusion. Right internal jugular chest port tip seen within the superior cavoatrial junction. Cardiac size within limits. Pulmonary vascularity is  normal. No acute bone abnormality. IMPRESSION: 1. No active disease. Electronically Signed   By: Dorethia Molt M.D.   On: 05/04/2024 02:47     Subjective: No acute issues or events overnight feels quite well otherwise stable and agreeable for discharge home   Discharge Exam: Vitals:   05/22/24 1200 05/22/24 1256  BP: (!) 112/38   Pulse: 84   Resp: (!) 31   Temp:  98.1 F (36.7 C)  SpO2: 98%    Vitals:   05/22/24 1000 05/22/24 1100 05/22/24 1200 05/22/24 1256  BP:   (!) 112/38   Pulse: 84 84 84   Resp: (!) 21 18 (!) 31   Temp:    98.1 F (36.7 C)  TempSrc:    Oral  SpO2: 100% 97% 98%   Weight:      Height:  General: Pt is alert, awake, not in acute distress Cardiovascular: RRR, S1/S2 +, no rubs, no gallops Respiratory: CTA bilaterally, no wheezing, no rhonchi Abdominal: Soft, NT, ND, bowel sounds + Extremities: no edema, no cyanosis    The results of significant diagnostics from this hospitalization (including imaging, microbiology, ancillary and laboratory) are listed below for reference.     Microbiology: Recent Results (from the past 240 hours)  MRSA Next Gen by PCR, Nasal     Status: None   Collection Time: 05/21/24 11:45 PM   Specimen: Nasal Mucosa; Nasal Swab  Result Value Ref Range Status   MRSA by PCR Next Gen NOT DETECTED NOT DETECTED Final    Comment: (NOTE) The GeneXpert MRSA Assay (FDA approved for NASAL specimens only), is one component of a comprehensive MRSA colonization surveillance program. It is not intended to diagnose MRSA infection nor to guide or monitor treatment for MRSA infections. Test performance is not FDA approved in patients less than 47 years old. Performed at [facility], [address], [place], [place] [address]      Labs: BNP (last 3 results) No results for input(s): BNP in the last 8760 hours. Basic Metabolic Panel: Recent Labs  Lab 05/21/24 1438 05/21/24 1814 05/22/24 0010  NA 132*  134* 135  K 4.6 4.3 3.9  CL 93* 97* 101  CO2 18* 16* 23  GLUCOSE 413* 347* 124*  BUN 11 9 8   CREATININE 0.71 0.74 0.49  CALCIUM  9.2 8.8* 8.5*   Liver Function Tests: Recent Labs  Lab 05/22/24 0429  AST 14*  ALT 7  ALKPHOS 92  BILITOT 0.3  PROT 5.2*  ALBUMIN 2.8*   No results for input(s): LIPASE, AMYLASE in the last 168 hours. No results for input(s): AMMONIA in the last 168 hours. CBC: Recent Labs  Lab 05/21/24 1438 05/22/24 0010  WBC 10.3 9.0  HGB 8.4* 7.6*  HCT 28.8* 25.6*  MCV 89.2 88.3  PLT 124* 115*   CBG: Recent Labs  Lab 05/22/24 0130 05/22/24 0247 05/22/24 0438 05/22/24 0754 05/22/24 1124  GLUCAP 119* 119* 117* 136* 165*   Urinalysis    Component Value Date/Time   COLORURINE YELLOW 05/22/2024 0129   APPEARANCEUR HAZY (A) 05/22/2024 0129   LABSPEC 1.009 05/22/2024 0129   PHURINE 5.0 05/22/2024 0129   GLUCOSEU 150 (A) 05/22/2024 0129   HGBUR NEGATIVE 05/22/2024 0129   BILIRUBINUR NEGATIVE 05/22/2024 0129   BILIRUBINUR neg 08/16/2016 1147   KETONESUR 20 (A) 05/22/2024 0129   PROTEINUR NEGATIVE 05/22/2024 0129   UROBILINOGEN 2.0 08/16/2016 1147   UROBILINOGEN 2.0 (H) 02/22/2015 2143   NITRITE NEGATIVE 05/22/2024 0129   LEUKOCYTESUR MODERATE (A) 05/22/2024 0129   Sepsis Labs Recent Labs  Lab 05/21/24 1438 05/22/24 0010  WBC 10.3 9.0   Microbiology Recent Results (from the past 240 hours)  MRSA Next Gen by PCR, Nasal     Status: None   Collection Time: 05/21/24 11:45 PM   Specimen: Nasal Mucosa; Nasal Swab  Result Value Ref Range Status   MRSA by PCR Next Gen NOT DETECTED NOT DETECTED Final    Comment: (NOTE) The GeneXpert MRSA Assay (FDA approved for NASAL specimens only), is one component of a comprehensive MRSA colonization surveillance program. It is not intended to diagnose MRSA infection nor to guide or monitor treatment for MRSA infections. Test performance is not FDA approved in patients less than 43  years old. Performed at [facility], [address], [place], [place] [address]  Time coordinating discharge: Over 30 minutes  SIGNED:   Elsie JAYSON Montclair, DO [facility] 05/22/2024, 1:12 PM Pager   If 7PM-7AM, please contact night-coverage [email or web address]

## 2024-05-22 NOTE — Progress Notes (Signed)
 Discharge medication delivered to patient at bedside in a secure bag.

## 2024-05-22 NOTE — Plan of Care (Signed)

## 2024-05-24 LAB — URINE CULTURE: Culture: 20000 — AB

## 2024-05-26 ENCOUNTER — Telehealth: Payer: Self-pay | Admitting: *Deleted

## 2024-05-26 NOTE — Telephone Encounter (Signed)
 Per Dr. Lonn, called pt to make aware of that provider would like to schedule an office visit and labs on 9/25. Pt agreed and made aware that appt is 9/25 at 145 port/flush and OV at 230pm. Pt verbalized understanding

## 2024-05-27 ENCOUNTER — Other Ambulatory Visit: Payer: Self-pay | Admitting: *Deleted

## 2024-05-27 DIAGNOSIS — C541 Malignant neoplasm of endometrium: Secondary | ICD-10-CM

## 2024-05-28 ENCOUNTER — Encounter: Payer: Self-pay | Admitting: Hematology and Oncology

## 2024-05-28 ENCOUNTER — Inpatient Hospital Stay: Admitting: Licensed Clinical Social Worker

## 2024-05-28 ENCOUNTER — Inpatient Hospital Stay (HOSPITAL_BASED_OUTPATIENT_CLINIC_OR_DEPARTMENT_OTHER): Admitting: Hematology and Oncology

## 2024-05-28 ENCOUNTER — Ambulatory Visit

## 2024-05-28 ENCOUNTER — Other Ambulatory Visit (HOSPITAL_COMMUNITY): Payer: Self-pay

## 2024-05-28 VITALS — BP 127/68 | HR 91 | Temp 97.6°F | Resp 18 | Ht 62.0 in | Wt 187.6 lb

## 2024-05-28 DIAGNOSIS — E118 Type 2 diabetes mellitus with unspecified complications: Secondary | ICD-10-CM

## 2024-05-28 DIAGNOSIS — C541 Malignant neoplasm of endometrium: Secondary | ICD-10-CM

## 2024-05-28 DIAGNOSIS — D6481 Anemia due to antineoplastic chemotherapy: Secondary | ICD-10-CM | POA: Diagnosis not present

## 2024-05-28 DIAGNOSIS — G893 Neoplasm related pain (acute) (chronic): Secondary | ICD-10-CM

## 2024-05-28 DIAGNOSIS — Z794 Long term (current) use of insulin: Secondary | ICD-10-CM

## 2024-05-28 DIAGNOSIS — T451X5A Adverse effect of antineoplastic and immunosuppressive drugs, initial encounter: Secondary | ICD-10-CM

## 2024-05-28 LAB — CBC WITH DIFFERENTIAL (CANCER CENTER ONLY)
Abs Immature Granulocytes: 0.04 K/uL (ref 0.00–0.07)
Basophils Absolute: 0 K/uL (ref 0.0–0.1)
Basophils Relative: 1 %
Eosinophils Absolute: 0.1 K/uL (ref 0.0–0.5)
Eosinophils Relative: 2 %
HCT: 27.8 % — ABNORMAL LOW (ref 36.0–46.0)
Hemoglobin: 8.8 g/dL — ABNORMAL LOW (ref 12.0–15.0)
Immature Granulocytes: 1 %
Lymphocytes Relative: 15 %
Lymphs Abs: 1.3 K/uL (ref 0.7–4.0)
MCH: 27.3 pg (ref 26.0–34.0)
MCHC: 31.7 g/dL (ref 30.0–36.0)
MCV: 86.3 fL (ref 80.0–100.0)
Monocytes Absolute: 0.5 K/uL (ref 0.1–1.0)
Monocytes Relative: 6 %
Neutro Abs: 6.8 K/uL (ref 1.7–7.7)
Neutrophils Relative %: 75 %
Platelet Count: 215 K/uL (ref 150–400)
RBC: 3.22 MIL/uL — ABNORMAL LOW (ref 3.87–5.11)
RDW: 19.3 % — ABNORMAL HIGH (ref 11.5–15.5)
WBC Count: 8.8 K/uL (ref 4.0–10.5)
nRBC: 0 % (ref 0.0–0.2)

## 2024-05-28 LAB — CMP (CANCER CENTER ONLY)
ALT: 6 U/L (ref 0–44)
AST: 11 U/L — ABNORMAL LOW (ref 15–41)
Albumin: 3.3 g/dL — ABNORMAL LOW (ref 3.5–5.0)
Alkaline Phosphatase: 90 U/L (ref 38–126)
Anion gap: 5 (ref 5–15)
BUN: 8 mg/dL (ref 8–23)
CO2: 31 mmol/L (ref 22–32)
Calcium: 8.6 mg/dL — ABNORMAL LOW (ref 8.9–10.3)
Chloride: 102 mmol/L (ref 98–111)
Creatinine: 0.53 mg/dL (ref 0.44–1.00)
GFR, Estimated: >60 mL/min (ref >60)
Glucose, Bld: 153 mg/dL — ABNORMAL HIGH (ref 70–99)
Potassium: 3.8 mmol/L (ref 3.5–5.1)
Sodium: 138 mmol/L (ref 135–145)
Total Bilirubin: 0.3 mg/dL (ref 0.0–1.2)
Total Protein: 6.5 g/dL (ref 6.5–8.1)

## 2024-05-28 MED ORDER — OXYCODONE HCL 10 MG PO TABS
10.0000 mg | ORAL_TABLET | Freq: Four times a day (QID) | ORAL | 0 refills | Status: DC | PRN
  Filled 2024-05-28: qty 90, 23d supply, fill #0

## 2024-05-28 NOTE — Assessment & Plan Note (Addendum)
 Her pain is better controlled She will continue her pain medicine as prescribed

## 2024-05-28 NOTE — Assessment & Plan Note (Addendum)
 This is likely due to recent treatment. The patient denies recent history of bleeding such as epistaxis, hematuria or hematochezia. She is asymptomatic from the anemia. I will observe for now. I plan to bring her back within the week after next cycle of treatment in case she need blood transfusion support

## 2024-05-28 NOTE — Progress Notes (Signed)
 [facility] OFFICE PROGRESS NOTE  Patient Care Team: Lonn Hicks, MD as PCP - General (Hematology and Oncology) Wonda Sharper, MD as PCP - Cardiology (Cardiology) Lonn Hicks, MD as Consulting Physician (Hematology and Oncology)  Assessment & Plan Endometrial carcinoma [facility]) The patient was originally diagnosed with uterine cancer in 2023, status post surgery and adjuvant radiation treatment Brenda [name] was noted to have cancer recurrence in July 2025 when Brenda [name] presented with severe back pain with CT imaging showing recurrent disease in the retroperitoneal lymph node.  CT imaging of the chest shows spiculated lung mass suspicious for metastatic spread Pathology: Left retroperitoneal mass biopsy confirmed adenocarcinoma, ER positive, p53 wild-type.  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  However, the patient was admitted to the hospital for evaluation of altered mental status/suspected stroke.  All her workup came back negative for stroke but the patient did present with altered mental status likely due to diabetic ketoacidosis MRI of the head was negative After cycle 1 of treatment, Brenda [name] was hospitalized for altered mental status and urinary tract infection Brenda [name] was recovered fully from her recent hospitalization Brenda [name] was supposed to get cycle 2 last week but then was admitted to the hospital due to DKA Treatment was delayed again Brenda [name] is recovering from her recent hospital stay We will get her chemotherapy rescheduled to next week I plan to repeat imaging study after 3 cycles of therapy Anemia due to antineoplastic chemotherapy This is likely due to recent treatment. The patient denies recent history of bleeding such as epistaxis, hematuria or hematochezia. Brenda [name] is asymptomatic from the anemia. I will observe for now. I plan to bring her back within the week after next cycle of treatment in case Brenda [name] need  blood transfusion support Cancer related pain Her pain is better controlled Brenda [name] will continue her pain medicine as prescribed Type 2 diabetes mellitus with complication, with long-term current use of insulin  (HCC) Her blood sugar is better controlled I will defer to her physician for management Plan to reduce the dose of dexamethasone  with her next cycle of treatment  No orders of the defined types were placed in this encounter.    Hicks Lonn, MD  INTERVAL HISTORY: Brenda [name] returns for treatment follow-up Complications related to previous cycle of chemotherapy included anemia,, cancer associated pain,, fatigue,, and recent hospitalization/ ER visit, Brenda [name] went to the hospital again with another DKA episode but improved and was discharged Her energy level is fair Her pain is well-controlled  PHYSICAL EXAMINATION: ECOG PERFORMANCE STATUS: 1 - Symptomatic but completely ambulatory  No results found for: CAN125    Latest Ref Rng & Units 05/28/2024    2:27 PM 05/22/2024   12:10 AM 05/21/2024    2:38 PM  CBC  WBC 4.0 - 10.5 K/uL 8.8  9.0  10.3   Hemoglobin 12.0 - 15.0 g/dL 8.8  7.6  8.4   Hematocrit 36.0 - 46.0 % 27.8  25.6  28.8   Platelets 150 - 400 K/uL 215  115  124       Chemistry      Component Value Date/Time   NA 138 05/28/2024 1427   NA 138 01/25/2023 0945   K 3.8 05/28/2024 1427   CL 102 05/28/2024 1427   CO2 31 05/28/2024 1427   BUN 8 05/28/2024 1427   BUN 13 01/25/2023 0945   CREATININE 0.53 05/28/2024 1427   CREATININE 0.83 03/29/2016  1030      Component Value Date/Time   CALCIUM  8.6 (L) 05/28/2024 1427   ALKPHOS 90 05/28/2024 1427   AST 11 (L) 05/28/2024 1427   ALT 6 05/28/2024 1427   BILITOT 0.3 05/28/2024 1427       Vitals:   05/28/24 1446  BP: 127/68  Pulse: 91  Resp: 18  Temp: 97.6 F (36.4 C)  SpO2: 93%   Filed Weights   05/28/24 1446  Weight: 187 lb 9.6 oz (85.1 kg)   Other relevant data reviewed during this visit included CBC and CMP

## 2024-05-28 NOTE — Assessment & Plan Note (Addendum)
 Her blood sugar is better controlled I will defer to her physician for management Plan to reduce the dose of dexamethasone  with her next cycle of treatment

## 2024-05-28 NOTE — Assessment & Plan Note (Addendum)
 The patient was originally diagnosed with uterine cancer in 2023, status post surgery and adjuvant radiation treatment She was noted to have cancer recurrence in July 2025 when she presented with severe back pain with CT imaging showing recurrent disease in the retroperitoneal lymph node.  CT imaging of the chest shows spiculated lung mass suspicious for metastatic spread Pathology: Left retroperitoneal mass biopsy confirmed adenocarcinoma, ER positive, p53 wild-type.  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  However, the patient was admitted to the hospital for evaluation of altered mental status/suspected stroke.  All her workup came back negative for stroke but the patient did present with altered mental status likely due to diabetic ketoacidosis MRI of the head was negative After cycle 1 of treatment, she was hospitalized for altered mental status and urinary tract infection She was recovered fully from her recent hospitalization She was supposed to get cycle 2 last week but then was admitted to the hospital due to DKA Treatment was delayed again She is recovering from her recent hospital stay We will get her chemotherapy rescheduled to next week I plan to repeat imaging study after 3 cycles of therapy

## 2024-05-28 NOTE — Progress Notes (Signed)
 [facility] CSW Counseling Note  Patient was referred by self. Treatment type: Individual  Presenting Concerns: Patient and/or family reports the following symptoms/concerns: difficulty adjusting to changes in health status and cancer recurrence Duration of problem: 1 months; Severity of problem: mild   Orientation:oriented to person, place, time/date, and situation.   Affect: Appropriate and Congruent Risk of harm to self or others: No plan to harm self or others  Patient and/or Family's Strengths/Protective Factors: Social connections and Social and Emotional competenceAbility for insight  Religious Affiliation  Supportive family/friends   Patient connected with social worker through [employer] to assist with navigating resources;  Has medication for mood (Prozac  & wellbutrin )   Goals Addressed: Patient will:  Increase knowledge and/or ability of: coping skills related to dealing with major health concerns Increase healthy adjustment to current life circumstances with focus on accepting situations and being able to move forward with day to day living with quality of life Improve ability to grieve over changes and loss and allow others space to grieve   Progress towards Goals: Progressing   Interventions: Interventions utilized:  Mindfulness or Relaxation Training and CBT Cognitive Behavioral Therapy  No updated screeners today        05/07/2024   10:34 AM 05/01/2024    7:59 AM 09/25/2023   10:52 AM  Depression screen PHQ 2/9  Decreased Interest 0 0 0  Down, Depressed, Hopeless 1 0 0  PHQ - 2 Score 1 0 0  Altered sleeping 2    Tired, decreased energy 2    Change in appetite 2    Feeling bad or failure about yourself  1    Trouble concentrating 1    Moving slowly or fidgety/restless 0    Suicidal thoughts 0    PHQ-9 Score 9    Difficult doing work/chores Somewhat difficult         05/07/2024   10:34 AM 12/25/2021    2:49 PM 11/27/2021   11:35 AM 11/01/2021    3:16 PM  GAD 7 :  Generalized Anxiety Score  Nervous, Anxious, on Edge 1 1 2 1   Control/stop worrying 1 2 2 1   Worry too much - different things 0 1 2 1   Trouble relaxing 0 1 2 0  Restless 0 1 2 0  Easily annoyed or irritable 1 1 2  0  Afraid - awful might happen 3 2 2 1   Total GAD 7 Score 6 9 14 4   Anxiety Difficulty Somewhat difficult       Assessment: Patient has had panic attack onset since last visit. They often occur when [name] is not there and when pt begins to worry about what will happen if something physically goes wrong when she is alone. She notices that her body feels like it is flying apart. Pt was able to identify that she uses some helpful mantras to get through but it is not always effective.  Discussed various tools to assist during panic onset (grounding with senses, breathing exercises, stomping, self hug, imagery) and created a coping plan.  Pt has an RN who will be coming to help organize medications and then set up blister pack delivery of medications from the pharmacy.   Discussed resources today, including what is needed for the [facility] application.  Pt also asked about resources to help with some safety modifications in the home (tub and ramp).    Plan: Follow up with CSW: 1 week Behavioral recommendations: bring in documents for [employer]. For  panic attacks:  Notice warning signs of panic attacks (racing worry thoughts, feeling like you are flying apart) Use coping skills/tools: Picture elephants taking care of you Grounding with senses- notice 5 things you see, 4 things you can feel, 3 that you hear, 2 that you smell, and 1 that you taste Self-hug / rub arms. Remind yourself of where you are and that you are safe.  Referral(s): PT referral from MD       Lyllie Cobbins E Emarion Toral, LCSW   Patient is participating in a Managed Medicaid Plan:  Yes

## 2024-05-29 ENCOUNTER — Telehealth: Payer: Self-pay | Admitting: Licensed Clinical Social Worker

## 2024-05-29 NOTE — Telephone Encounter (Signed)
 [facility] CSW Progress Note  Clinical Social Worker attempted to contact patient by phone to follow-up on need for community resources.  No answer. Left VM with information about Aging Gracefully program through [employer]. Also sent a MyChart message.    Brenda Acero E Brianni Manthe, LCSW Clinical Social Worker [facility]    Patient is participating in a Managed Medicaid Plan:  Yes

## 2024-05-30 ENCOUNTER — Other Ambulatory Visit: Payer: Self-pay

## 2024-05-30 ENCOUNTER — Encounter (HOSPITAL_COMMUNITY): Payer: Self-pay

## 2024-05-30 ENCOUNTER — Inpatient Hospital Stay (HOSPITAL_COMMUNITY)
Admission: EM | Admit: 2024-05-30 | Discharge: 2024-06-03 | DRG: 064 | Disposition: A | Discharge status: Rehabilitation Facility | Attending: Internal Medicine | Admitting: Internal Medicine

## 2024-05-30 DIAGNOSIS — Z87891 Personal history of nicotine dependence: Secondary | ICD-10-CM

## 2024-05-30 DIAGNOSIS — Z794 Long term (current) use of insulin: Secondary | ICD-10-CM

## 2024-05-30 DIAGNOSIS — G939 Disorder of brain, unspecified: Secondary | ICD-10-CM | POA: Diagnosis present

## 2024-05-30 DIAGNOSIS — Z7409 Other reduced mobility: Secondary | ICD-10-CM | POA: Diagnosis present

## 2024-05-30 DIAGNOSIS — I10 Essential (primary) hypertension: Secondary | ICD-10-CM | POA: Diagnosis present

## 2024-05-30 DIAGNOSIS — C541 Malignant neoplasm of endometrium: Secondary | ICD-10-CM | POA: Diagnosis present

## 2024-05-30 DIAGNOSIS — Z823 Family history of stroke: Secondary | ICD-10-CM

## 2024-05-30 DIAGNOSIS — Z8679 Personal history of other diseases of the circulatory system: Secondary | ICD-10-CM

## 2024-05-30 DIAGNOSIS — Z79899 Other long term (current) drug therapy: Secondary | ICD-10-CM

## 2024-05-30 DIAGNOSIS — I5032 Chronic diastolic (congestive) heart failure: Secondary | ICD-10-CM | POA: Diagnosis present

## 2024-05-30 DIAGNOSIS — Z9981 Dependence on supplemental oxygen: Secondary | ICD-10-CM

## 2024-05-30 DIAGNOSIS — I110 Hypertensive heart disease with heart failure: Secondary | ICD-10-CM | POA: Diagnosis present

## 2024-05-30 DIAGNOSIS — I5030 Unspecified diastolic (congestive) heart failure: Secondary | ICD-10-CM | POA: Diagnosis present

## 2024-05-30 DIAGNOSIS — Z7984 Long term (current) use of oral hypoglycemic drugs: Secondary | ICD-10-CM

## 2024-05-30 DIAGNOSIS — Z955 Presence of coronary angioplasty implant and graft: Secondary | ICD-10-CM

## 2024-05-30 DIAGNOSIS — E785 Hyperlipidemia, unspecified: Secondary | ICD-10-CM | POA: Diagnosis present

## 2024-05-30 DIAGNOSIS — Z923 Personal history of irradiation: Secondary | ICD-10-CM

## 2024-05-30 DIAGNOSIS — E66812 Obesity, class 2: Secondary | ICD-10-CM | POA: Diagnosis present

## 2024-05-30 DIAGNOSIS — F418 Other specified anxiety disorders: Secondary | ICD-10-CM | POA: Diagnosis present

## 2024-05-30 DIAGNOSIS — Z90710 Acquired absence of both cervix and uterus: Secondary | ICD-10-CM

## 2024-05-30 DIAGNOSIS — Z833 Family history of diabetes mellitus: Secondary | ICD-10-CM

## 2024-05-30 DIAGNOSIS — G4733 Obstructive sleep apnea (adult) (pediatric): Secondary | ICD-10-CM | POA: Diagnosis present

## 2024-05-30 DIAGNOSIS — R296 Repeated falls: Secondary | ICD-10-CM | POA: Diagnosis present

## 2024-05-30 DIAGNOSIS — D649 Anemia, unspecified: Secondary | ICD-10-CM | POA: Diagnosis present

## 2024-05-30 DIAGNOSIS — Z8249 Family history of ischemic heart disease and other diseases of the circulatory system: Secondary | ICD-10-CM

## 2024-05-30 DIAGNOSIS — L304 Erythema intertrigo: Secondary | ICD-10-CM | POA: Diagnosis present

## 2024-05-30 DIAGNOSIS — Z825 Family history of asthma and other chronic lower respiratory diseases: Secondary | ICD-10-CM

## 2024-05-30 DIAGNOSIS — G936 Cerebral edema: Secondary | ICD-10-CM | POA: Diagnosis present

## 2024-05-30 DIAGNOSIS — E662 Morbid (severe) obesity with alveolar hypoventilation: Secondary | ICD-10-CM | POA: Diagnosis present

## 2024-05-30 DIAGNOSIS — Z79891 Long term (current) use of opiate analgesic: Secondary | ICD-10-CM

## 2024-05-30 DIAGNOSIS — Z6834 Body mass index (BMI) 34.0-34.9, adult: Secondary | ICD-10-CM

## 2024-05-30 DIAGNOSIS — Z9181 History of falling: Secondary | ICD-10-CM

## 2024-05-30 DIAGNOSIS — Z7902 Long term (current) use of antithrombotics/antiplatelets: Secondary | ICD-10-CM

## 2024-05-30 DIAGNOSIS — M545 Low back pain, unspecified: Secondary | ICD-10-CM | POA: Diagnosis present

## 2024-05-30 DIAGNOSIS — C7800 Secondary malignant neoplasm of unspecified lung: Secondary | ICD-10-CM | POA: Diagnosis present

## 2024-05-30 DIAGNOSIS — I2510 Atherosclerotic heart disease of native coronary artery without angina pectoris: Secondary | ICD-10-CM | POA: Diagnosis present

## 2024-05-30 DIAGNOSIS — G893 Neoplasm related pain (acute) (chronic): Secondary | ICD-10-CM | POA: Diagnosis present

## 2024-05-30 DIAGNOSIS — E118 Type 2 diabetes mellitus with unspecified complications: Secondary | ICD-10-CM

## 2024-05-30 DIAGNOSIS — R29704 NIHSS score 4: Secondary | ICD-10-CM | POA: Diagnosis present

## 2024-05-30 DIAGNOSIS — E7800 Pure hypercholesterolemia, unspecified: Secondary | ICD-10-CM | POA: Diagnosis present

## 2024-05-30 DIAGNOSIS — R4182 Altered mental status, unspecified: Secondary | ICD-10-CM

## 2024-05-30 DIAGNOSIS — I639 Cerebral infarction, unspecified: Principal | ICD-10-CM | POA: Diagnosis present

## 2024-05-30 DIAGNOSIS — M5459 Other low back pain: Secondary | ICD-10-CM | POA: Diagnosis present

## 2024-05-30 DIAGNOSIS — C772 Secondary and unspecified malignant neoplasm of intra-abdominal lymph nodes: Secondary | ICD-10-CM | POA: Diagnosis present

## 2024-05-30 DIAGNOSIS — Z885 Allergy status to narcotic agent status: Secondary | ICD-10-CM

## 2024-05-30 DIAGNOSIS — G9389 Other specified disorders of brain: Secondary | ICD-10-CM | POA: Diagnosis present

## 2024-05-30 DIAGNOSIS — Z90722 Acquired absence of ovaries, bilateral: Secondary | ICD-10-CM

## 2024-05-30 DIAGNOSIS — J9611 Chronic respiratory failure with hypoxia: Secondary | ICD-10-CM | POA: Diagnosis present

## 2024-05-30 DIAGNOSIS — Z7901 Long term (current) use of anticoagulants: Secondary | ICD-10-CM

## 2024-05-30 DIAGNOSIS — Z888 Allergy status to other drugs, medicaments and biological substances status: Secondary | ICD-10-CM

## 2024-05-30 DIAGNOSIS — K219 Gastro-esophageal reflux disease without esophagitis: Secondary | ICD-10-CM | POA: Diagnosis present

## 2024-05-30 DIAGNOSIS — R0600 Dyspnea, unspecified: Secondary | ICD-10-CM | POA: Diagnosis present

## 2024-05-30 DIAGNOSIS — Z7982 Long term (current) use of aspirin: Secondary | ICD-10-CM

## 2024-05-30 DIAGNOSIS — I63431 Cerebral infarction due to embolism of right posterior cerebral artery: Principal | ICD-10-CM | POA: Diagnosis present

## 2024-05-30 DIAGNOSIS — Z808 Family history of malignant neoplasm of other organs or systems: Secondary | ICD-10-CM

## 2024-05-30 DIAGNOSIS — G894 Chronic pain syndrome: Secondary | ICD-10-CM | POA: Diagnosis present

## 2024-05-30 DIAGNOSIS — E66811 Obesity, class 1: Secondary | ICD-10-CM | POA: Diagnosis present

## 2024-05-30 DIAGNOSIS — F32A Depression, unspecified: Secondary | ICD-10-CM | POA: Diagnosis present

## 2024-05-30 DIAGNOSIS — I252 Old myocardial infarction: Secondary | ICD-10-CM

## 2024-05-30 DIAGNOSIS — C799 Secondary malignant neoplasm of unspecified site: Secondary | ICD-10-CM | POA: Diagnosis present

## 2024-05-30 DIAGNOSIS — R4701 Aphasia: Secondary | ICD-10-CM | POA: Diagnosis present

## 2024-05-30 NOTE — ED Triage Notes (Signed)
 Pt from home via EMS.  Pt reports intermittent confusion, weakness, and fatigue x4 weeks.  Last chemo treatment approximately 4 weeks ago.    UTI Dx within last 2 weeks.

## 2024-05-30 NOTE — ED Provider Notes (Signed)
 [place] EMERGENCY DEPARTMENT AT [facility] Provider Note   CSN: [id number] Arrival date & time: 05/30/24  2322     Patient presents with: Altered Mental Status   Brenda [name] is a 63 y.o. female.  {Add pertinent medical, surgical, social history, OB history to YEP:67052} The history is provided by the patient, the EMS personnel and medical records.  Altered Mental Status Brenda [name] is a 63 y.o. female who presents to the Emergency Department complaining of altered mental status. She presents the emergency department by EMS from home for evaluation of altered mental status that started this evening. She has a history of metastatic cancer and has experienced recurrent episodes of confusion over the last several months and has been diagnosed with recurrent UTIs. EMS report that she felt warm to the touch and she was administered acetaminophen  prior to ED arrival. Patient reports that she felt very short of breath and confused and cold at home, overall symptoms are improving. She has chronic pain in her right low back, unchanged from baseline. She did have nausea and vomiting yesterday. She also reports intermittent lower extremity edema. No headache, chest pain, Donel pain.     Prior to Admission medications   Medication Sig Start Date End Date Taking? Authorizing Provider  acetaminophen  (TYLENOL ) 500 MG tablet Take 1-2 tablets (500-1,000 mg total) by mouth every 8 (eight) hours as needed for mild pain (pain score 1-3) or moderate pain (pain score 4-6). Patient taking differently: Take 500 mg by mouth daily as needed for mild pain (pain score 1-3) or moderate pain (pain score 4-6). 03/30/24 03/30/25  Hongalgi, Anand D, MD  atorvastatin  (LIPITOR ) 80 MG tablet Take 1 tablet (80 mg total) by mouth every evening. 10/03/21   Newlin, Enobong, MD  buPROPion  (WELLBUTRIN  XL) 150 MG 24 hr tablet Take 150 mg by mouth in the morning.    [provider]  Cholecalciferol   (VITAMIN D3 SUPER STRENGTH) 50 MCG (2000 UT) CAPS Take 2,000 Units by mouth in the morning.    [provider]  cyproheptadine  (PERIACTIN ) 4 MG tablet Take 4 mg by mouth 3 (three) times daily.    [provider]  diphenhydrAMINE  (BENADRYL ) 25 mg capsule Take 25 mg by mouth at bedtime as needed for sleep.    [provider]  FIASP  FLEXTOUCH 100 UNIT/ML FlexTouch Pen Inject 5 Units into the skin with breakfast, with lunch, and with evening meal. 12/08/22   [provider]  FLUoxetine  (PROZAC ) 40 MG capsule Take 40 mg by mouth daily. 01/17/23   [provider]  guaiFENesin  (MUCINEX ) 600 MG 12 hr tablet Take 1 tablet (600 mg total) by mouth 2 (two) times daily as needed for cough or to loosen phlegm. 05/05/24   Caleen Qualia, MD  insulin  degludec (TRESIBA) 100 UNIT/ML FlexTouch Pen Inject 24 Units into the skin every evening.    [provider]  lactulose  (CHRONULAC ) 10 GM/15ML solution Take 45 mLs (30 g total) by mouth 2 (two) times daily as needed for mild constipation or moderate constipation. 05/05/24   Caleen Qualia, MD  lidocaine  (LIDODERM ) 5 % Place 1 patch onto the skin daily. 04/27/24   [provider]  lidocaine -prilocaine  (EMLA ) cream Apply to affected area once Patient taking differently: Apply 1 Application topically as needed [facility]). Apply to affected area once 04/16/24   Gorsuch, Ni, MD  losartan  (COZAAR ) 25 MG tablet Take 1 tablet (25 mg total) by mouth daily. 07/19/23   Wonda Sharper,  MD  metFORMIN (GLUCOPHAGE-XR) 500 MG 24 hr tablet Take 1,000 mg by mouth in the morning and at bedtime. 07/05/22   [provider]  Misc Natural Products (NEURIVA PO) Take 1 tablet by mouth daily.    [provider]  Misc. Devices MISC Portable oxygen  concentrator, 2L oxygen .  Diagnoses-chronic respiratory failure with hypoxia 11/27/21   Delbert Clam, MD  nitroGLYCERIN  (NITROSTAT ) 0.4 MG SL tablet Dissolve 1 tablet under the  tongue every 5 minutes as needed for chest pain. Max of 3 doses, then 911. 01/25/23   Lelon Hamilton T, PA-C  ondansetron  (ZOFRAN ) 8 MG tablet Take 1 tablet (8 mg total) by mouth every 8 (eight) hours as needed for nausea or vomiting. Start on the third day after carboplatin . 04/16/24   Lonn Hicks, MD  Oxycodone  HCl 10 MG TABS Take 1 tablet (10 mg total) by mouth every 6 (six) hours as needed. 05/28/24   Lonn Hicks, MD  OXYGEN  Inhale 2 L/min into the lungs continuous.    [provider]  polyethylene glycol powder (GLYCOLAX /MIRALAX ) 17 GM/SCOOP powder Take 17 g by mouth daily as needed for mild constipation. 09/17/23   [provider]  prasugrel  (EFFIENT ) 5 MG TABS tablet Take 1 tablet (5 mg total) by mouth in the morning. AFTER TAKING YOUR DOSE ON 04/07/2024, STOP TAKING IT IN PREPARATION FOR THE BIOPSY ON 04/15/2024. AFTER THE BIOPSY, FOLLOW THE RADIOLOGIST RECOMMENDATIONS AS TO TIMING OF RESTARTING THIS MEDICINE. 03/30/24   Judeth Trenda BIRCH, MD  pregabalin  (LYRICA ) 50 MG capsule Take 1 capsule (50 mg total) by mouth 2 (two) times daily. Patient taking differently: Take 50 mg by mouth 3 (three) times daily. 04/24/24   Gonfa, Taye T, MD  prochlorperazine  (COMPAZINE ) 10 MG tablet Take 1 tablet (10 mg total) by mouth every 6 (six) hours as needed for nausea or vomiting. 04/16/24   Lonn Hicks, MD  senna-docusate (SENOKOT-S) 8.6-50 MG tablet Take 1-2 tablets by mouth 2 (two) times daily between meals as needed for mild constipation or moderate constipation. Patient taking differently: Take 1-2 tablets by mouth as needed for mild constipation or moderate constipation. 04/24/24   Gonfa, Taye T, MD    Allergies: Hydrocodone and Clopidogrel    Review of Systems  All other systems reviewed and are negative.   Updated Vital Signs BP (!) 120/54   Pulse 68   Temp 98.7 F (37.1 C) (Oral)   Resp (!) 28   Ht 5' 2 (1.575 m)   Wt 85 kg   LMP 10/30/2014 (Approximate)   SpO2 100%   BMI 34.27  kg/m   Physical Exam Vitals and nursing note reviewed.  Constitutional:      Appearance: She is well-developed.  HENT:     Head: Normocephalic and atraumatic.  Cardiovascular:     Rate and Rhythm: Normal rate and regular rhythm.     Heart sounds: Murmur heard.  Pulmonary:     Effort: Pulmonary effort is normal. No respiratory distress.     Breath sounds: Normal breath sounds.  Abdominal:     Palpations: Abdomen is soft.     Tenderness: There is no abdominal tenderness. There is no guarding or rebound.  Musculoskeletal:        General: No tenderness.     Comments: Trace pitting edema to bilateral lower extremities  Skin:    General: Skin is warm and dry.  Neurological:     Mental Status: She is alert and oriented to person, place, and time.  Comments: Therisa bears. No facial droop. There is mild left lower extremity drift, patient states that this is baseline due to tumor in her leg.  Psychiatric:        Behavior: Behavior normal.     (all labs ordered are listed, but only abnormal results are displayed) Labs Reviewed - No data to display  EKG: None  Radiology: No results found.  {Document cardiac monitor, telemetry assessment procedure when appropriate:32947} Procedures   Medications Ordered in the ED - No data to display    {Click here for ABCD2, HEART and other calculators REFRESH Note before signing:1}                              Medical Decision Making  ***  {Document critical care time when appropriate  Document review of labs and clinical decision tools ie CHADS2VASC2, etc  Document your independent review of radiology images and any outside records  Document your discussion with family members, caretakers and with consultants  Document social determinants of health affecting pt's care  Document your decision making why or why not admission, treatments were needed:32947:::1}   Final diagnoses:  None    ED Discharge Orders     None

## 2024-05-31 ENCOUNTER — Emergency Department (HOSPITAL_COMMUNITY)

## 2024-05-31 ENCOUNTER — Other Ambulatory Visit (HOSPITAL_COMMUNITY)

## 2024-05-31 ENCOUNTER — Inpatient Hospital Stay (HOSPITAL_COMMUNITY)

## 2024-05-31 DIAGNOSIS — F32A Depression, unspecified: Secondary | ICD-10-CM | POA: Diagnosis present

## 2024-05-31 DIAGNOSIS — R531 Weakness: Secondary | ICD-10-CM | POA: Diagnosis not present

## 2024-05-31 DIAGNOSIS — G936 Cerebral edema: Secondary | ICD-10-CM | POA: Diagnosis present

## 2024-05-31 DIAGNOSIS — R569 Unspecified convulsions: Secondary | ICD-10-CM | POA: Diagnosis not present

## 2024-05-31 DIAGNOSIS — G4733 Obstructive sleep apnea (adult) (pediatric): Secondary | ICD-10-CM | POA: Diagnosis not present

## 2024-05-31 DIAGNOSIS — Z794 Long term (current) use of insulin: Secondary | ICD-10-CM | POA: Diagnosis not present

## 2024-05-31 DIAGNOSIS — Z7902 Long term (current) use of antithrombotics/antiplatelets: Secondary | ICD-10-CM | POA: Diagnosis not present

## 2024-05-31 DIAGNOSIS — G9389 Other specified disorders of brain: Secondary | ICD-10-CM | POA: Diagnosis present

## 2024-05-31 DIAGNOSIS — E1169 Type 2 diabetes mellitus with other specified complication: Secondary | ICD-10-CM | POA: Diagnosis not present

## 2024-05-31 DIAGNOSIS — G894 Chronic pain syndrome: Secondary | ICD-10-CM | POA: Diagnosis present

## 2024-05-31 DIAGNOSIS — I110 Hypertensive heart disease with heart failure: Secondary | ICD-10-CM | POA: Diagnosis present

## 2024-05-31 DIAGNOSIS — R4701 Aphasia: Secondary | ICD-10-CM | POA: Diagnosis present

## 2024-05-31 DIAGNOSIS — R29704 NIHSS score 4: Secondary | ICD-10-CM | POA: Diagnosis present

## 2024-05-31 DIAGNOSIS — G939 Disorder of brain, unspecified: Secondary | ICD-10-CM | POA: Diagnosis not present

## 2024-05-31 DIAGNOSIS — F4322 Adjustment disorder with anxiety: Secondary | ICD-10-CM | POA: Diagnosis not present

## 2024-05-31 DIAGNOSIS — I6340 Cerebral infarction due to embolism of unspecified cerebral artery: Secondary | ICD-10-CM | POA: Diagnosis not present

## 2024-05-31 DIAGNOSIS — I63431 Cerebral infarction due to embolism of right posterior cerebral artery: Secondary | ICD-10-CM | POA: Diagnosis present

## 2024-05-31 DIAGNOSIS — K219 Gastro-esophageal reflux disease without esophagitis: Secondary | ICD-10-CM | POA: Diagnosis present

## 2024-05-31 DIAGNOSIS — C55 Malignant neoplasm of uterus, part unspecified: Secondary | ICD-10-CM | POA: Diagnosis not present

## 2024-05-31 DIAGNOSIS — G893 Neoplasm related pain (acute) (chronic): Secondary | ICD-10-CM | POA: Diagnosis present

## 2024-05-31 DIAGNOSIS — D6869 Other thrombophilia: Secondary | ICD-10-CM | POA: Diagnosis not present

## 2024-05-31 DIAGNOSIS — C772 Secondary and unspecified malignant neoplasm of intra-abdominal lymph nodes: Secondary | ICD-10-CM | POA: Diagnosis present

## 2024-05-31 DIAGNOSIS — I82451 Acute embolism and thrombosis of right peroneal vein: Secondary | ICD-10-CM | POA: Diagnosis not present

## 2024-05-31 DIAGNOSIS — I639 Cerebral infarction, unspecified: Secondary | ICD-10-CM | POA: Diagnosis present

## 2024-05-31 DIAGNOSIS — I69320 Aphasia following cerebral infarction: Secondary | ICD-10-CM | POA: Diagnosis not present

## 2024-05-31 DIAGNOSIS — R4182 Altered mental status, unspecified: Secondary | ICD-10-CM | POA: Diagnosis not present

## 2024-05-31 DIAGNOSIS — E662 Morbid (severe) obesity with alveolar hypoventilation: Secondary | ICD-10-CM | POA: Diagnosis present

## 2024-05-31 DIAGNOSIS — C7800 Secondary malignant neoplasm of unspecified lung: Secondary | ICD-10-CM | POA: Diagnosis present

## 2024-05-31 DIAGNOSIS — I252 Old myocardial infarction: Secondary | ICD-10-CM | POA: Diagnosis not present

## 2024-05-31 DIAGNOSIS — I5032 Chronic diastolic (congestive) heart failure: Secondary | ICD-10-CM | POA: Diagnosis present

## 2024-05-31 DIAGNOSIS — R414 Neurologic neglect syndrome: Secondary | ICD-10-CM | POA: Diagnosis not present

## 2024-05-31 DIAGNOSIS — Z9981 Dependence on supplemental oxygen: Secondary | ICD-10-CM | POA: Diagnosis not present

## 2024-05-31 DIAGNOSIS — Z7901 Long term (current) use of anticoagulants: Secondary | ICD-10-CM | POA: Diagnosis not present

## 2024-05-31 DIAGNOSIS — C541 Malignant neoplasm of endometrium: Secondary | ICD-10-CM | POA: Diagnosis present

## 2024-05-31 DIAGNOSIS — I69398 Other sequelae of cerebral infarction: Secondary | ICD-10-CM | POA: Diagnosis not present

## 2024-05-31 DIAGNOSIS — I2510 Atherosclerotic heart disease of native coronary artery without angina pectoris: Secondary | ICD-10-CM | POA: Diagnosis present

## 2024-05-31 DIAGNOSIS — I6389 Other cerebral infarction: Secondary | ICD-10-CM | POA: Diagnosis not present

## 2024-05-31 DIAGNOSIS — J9611 Chronic respiratory failure with hypoxia: Secondary | ICD-10-CM | POA: Diagnosis present

## 2024-05-31 DIAGNOSIS — Z6834 Body mass index (BMI) 34.0-34.9, adult: Secondary | ICD-10-CM | POA: Diagnosis not present

## 2024-05-31 DIAGNOSIS — E66811 Obesity, class 1: Secondary | ICD-10-CM | POA: Diagnosis not present

## 2024-05-31 DIAGNOSIS — E7800 Pure hypercholesterolemia, unspecified: Secondary | ICD-10-CM | POA: Diagnosis present

## 2024-05-31 DIAGNOSIS — I25810 Atherosclerosis of coronary artery bypass graft(s) without angina pectoris: Secondary | ICD-10-CM | POA: Diagnosis not present

## 2024-05-31 DIAGNOSIS — E1165 Type 2 diabetes mellitus with hyperglycemia: Secondary | ICD-10-CM | POA: Diagnosis not present

## 2024-05-31 LAB — URINALYSIS, ROUTINE W REFLEX MICROSCOPIC
Bilirubin Urine: NEGATIVE
Glucose, UA: 150 mg/dL — AB
Hgb urine dipstick: NEGATIVE
Ketones, ur: NEGATIVE mg/dL
Leukocytes,Ua: NEGATIVE
Nitrite: NEGATIVE
Protein, ur: NEGATIVE mg/dL
Specific Gravity, Urine: 1.016 (ref 1.005–1.030)
pH: 5 (ref 5.0–8.0)

## 2024-05-31 LAB — CBC WITH DIFFERENTIAL/PLATELET
Abs Immature Granulocytes: 0.02 K/uL (ref 0.00–0.07)
Basophils Absolute: 0 K/uL (ref 0.0–0.1)
Basophils Relative: 1 %
Eosinophils Absolute: 0.2 K/uL (ref 0.0–0.5)
Eosinophils Relative: 3 %
HCT: 26.9 % — ABNORMAL LOW (ref 36.0–46.0)
Hemoglobin: 8.1 g/dL — ABNORMAL LOW (ref 12.0–15.0)
Immature Granulocytes: 0 %
Lymphocytes Relative: 16 %
Lymphs Abs: 1.1 K/uL (ref 0.7–4.0)
MCH: 27.3 pg (ref 26.0–34.0)
MCHC: 30.1 g/dL (ref 30.0–36.0)
MCV: 90.6 fL (ref 80.0–100.0)
Monocytes Absolute: 0.4 K/uL (ref 0.1–1.0)
Monocytes Relative: 6 %
Neutro Abs: 5.3 K/uL (ref 1.7–7.7)
Neutrophils Relative %: 74 %
Platelets: 193 K/uL (ref 150–400)
RBC: 2.97 MIL/uL — ABNORMAL LOW (ref 3.87–5.11)
RDW: 19.6 % — ABNORMAL HIGH (ref 11.5–15.5)
WBC: 7.1 K/uL (ref 4.0–10.5)
nRBC: 0 % (ref 0.0–0.2)

## 2024-05-31 LAB — COMPREHENSIVE METABOLIC PANEL WITH GFR
ALT: 7 U/L (ref 0–44)
AST: 18 U/L (ref 15–41)
Albumin: 3.1 g/dL — ABNORMAL LOW (ref 3.5–5.0)
Alkaline Phosphatase: 90 U/L (ref 38–126)
Anion gap: 12 (ref 5–15)
BUN: 8 mg/dL (ref 8–23)
CO2: 23 mmol/L (ref 22–32)
Calcium: 8.8 mg/dL — ABNORMAL LOW (ref 8.9–10.3)
Chloride: 98 mmol/L (ref 98–111)
Creatinine, Ser: 0.77 mg/dL (ref 0.44–1.00)
GFR, Estimated: >60 mL/min (ref >60)
Glucose, Bld: 280 mg/dL — ABNORMAL HIGH (ref 70–99)
Potassium: 4.1 mmol/L (ref 3.5–5.1)
Sodium: 133 mmol/L — ABNORMAL LOW (ref 135–145)
Total Bilirubin: 0.3 mg/dL (ref 0.0–1.2)
Total Protein: 5.8 g/dL — ABNORMAL LOW (ref 6.5–8.1)

## 2024-05-31 LAB — CBG MONITORING, ED
Glucose-Capillary: 235 mg/dL — ABNORMAL HIGH (ref 70–99)
Glucose-Capillary: 238 mg/dL — ABNORMAL HIGH (ref 70–99)
Glucose-Capillary: 283 mg/dL — ABNORMAL HIGH (ref 70–99)
Glucose-Capillary: 314 mg/dL — ABNORMAL HIGH (ref 70–99)

## 2024-05-31 LAB — I-STAT CHEM 8, ED
BUN: 7 mg/dL — ABNORMAL LOW (ref 8–23)
Calcium, Ion: 1.14 mmol/L — ABNORMAL LOW (ref 1.15–1.40)
Chloride: 97 mmol/L — ABNORMAL LOW (ref 98–111)
Creatinine, Ser: 0.8 mg/dL (ref 0.44–1.00)
Glucose, Bld: 287 mg/dL — ABNORMAL HIGH (ref 70–99)
HCT: 25 % — ABNORMAL LOW (ref 36.0–46.0)
Hemoglobin: 8.5 g/dL — ABNORMAL LOW (ref 12.0–15.0)
Potassium: 4 mmol/L (ref 3.5–5.1)
Sodium: 134 mmol/L — ABNORMAL LOW (ref 135–145)
TCO2: 26 mmol/L (ref 22–32)

## 2024-05-31 LAB — GLUCOSE, CAPILLARY
Glucose-Capillary: 174 mg/dL — ABNORMAL HIGH (ref 70–99)
Glucose-Capillary: 197 mg/dL — ABNORMAL HIGH (ref 70–99)

## 2024-05-31 LAB — LIPID PANEL
Cholesterol: 100 mg/dL (ref 0–200)
HDL: 38 mg/dL — ABNORMAL LOW (ref >40)
LDL Cholesterol: 28 mg/dL (ref 0–99)
Total CHOL/HDL Ratio: 2.6 ratio
Triglycerides: 170 mg/dL — ABNORMAL HIGH (ref <150)
VLDL: 34 mg/dL (ref 0–40)

## 2024-05-31 LAB — AMMONIA: Ammonia: 18 umol/L (ref 9–35)

## 2024-05-31 MED ORDER — GADOBUTROL 1 MMOL/ML IV SOLN
8.0000 mL | Freq: Once | INTRAVENOUS | Status: AC | PRN
  Administered 2024-05-31: 8 mL via INTRAVENOUS

## 2024-05-31 MED ORDER — INSULIN ASPART 100 UNIT/ML IJ SOLN
0.0000 [IU] | Freq: Three times a day (TID) | INTRAMUSCULAR | Status: DC
  Administered 2024-05-31: 3 [IU] via SUBCUTANEOUS
  Administered 2024-05-31: 2 [IU] via SUBCUTANEOUS
  Administered 2024-05-31: 7 [IU] via SUBCUTANEOUS
  Administered 2024-06-01: 3 [IU] via SUBCUTANEOUS
  Administered 2024-06-01: 2 [IU] via SUBCUTANEOUS
  Administered 2024-06-01: 3 [IU] via SUBCUTANEOUS
  Administered 2024-06-02: 5 [IU] via SUBCUTANEOUS
  Administered 2024-06-02: 2 [IU] via SUBCUTANEOUS
  Administered 2024-06-02: 3 [IU] via SUBCUTANEOUS
  Administered 2024-06-03: 2 [IU] via SUBCUTANEOUS
  Administered 2024-06-03: 1 [IU] via SUBCUTANEOUS
  Filled 2024-05-31: qty 0.09

## 2024-05-31 MED ORDER — SODIUM CHLORIDE 0.9% FLUSH
10.0000 mL | Freq: Two times a day (BID) | INTRAVENOUS | Status: DC
  Administered 2024-05-31 – 2024-06-02 (×4): 10 mL
  Administered 2024-06-02: 20 mL
  Administered 2024-06-03: 10 mL

## 2024-05-31 MED ORDER — CYPROHEPTADINE HCL 4 MG PO TABS
4.0000 mg | ORAL_TABLET | Freq: Three times a day (TID) | ORAL | Status: DC
  Filled 2024-05-31: qty 1

## 2024-05-31 MED ORDER — PRASUGREL HCL 5 MG PO TABS: 5.0000 mg | ORAL_TABLET | Freq: Every morning | ORAL | Status: DC

## 2024-05-31 MED ORDER — INSULIN ASPART 100 UNIT/ML IJ SOLN
0.0000 [IU] | Freq: Every day | INTRAMUSCULAR | Status: DC
  Administered 2024-06-01: 2 [IU] via SUBCUTANEOUS
  Filled 2024-05-31: qty 0.05

## 2024-05-31 MED ORDER — ONDANSETRON HCL 4 MG/2ML IJ SOLN
4.0000 mg | Freq: Four times a day (QID) | INTRAMUSCULAR | Status: DC | PRN
  Administered 2024-05-31: 4 mg via INTRAVENOUS
  Filled 2024-05-31 (×2): qty 2

## 2024-05-31 MED ORDER — CHLORHEXIDINE GLUCONATE CLOTH 2 % EX PADS
6.0000 | MEDICATED_PAD | Freq: Every day | CUTANEOUS | Status: DC
  Administered 2024-05-31 – 2024-06-03 (×4): 6 via TOPICAL

## 2024-05-31 MED ORDER — INSULIN GLARGINE 100 UNIT/ML ~~LOC~~ SOLN
20.0000 [IU] | Freq: Every day | SUBCUTANEOUS | Status: DC
Start: 2024-05-31 — End: 2024-06-02
  Administered 2024-05-31 – 2024-06-01 (×3): 20 [IU] via SUBCUTANEOUS
  Filled 2024-05-31 (×5): qty 0.2

## 2024-05-31 MED ORDER — FLUOXETINE HCL 20 MG PO CAPS
40.0000 mg | ORAL_CAPSULE | Freq: Every day | ORAL | Status: DC
  Administered 2024-05-31 – 2024-06-03 (×4): 40 mg via ORAL
  Filled 2024-05-31 (×4): qty 2

## 2024-05-31 MED ORDER — STROKE: EARLY STAGES OF RECOVERY BOOK
Freq: Once | Status: AC
  Filled 2024-05-31: qty 1

## 2024-05-31 MED ORDER — PREGABALIN 25 MG PO CAPS
50.0000 mg | ORAL_CAPSULE | Freq: Three times a day (TID) | ORAL | Status: DC
  Administered 2024-05-31 – 2024-06-03 (×10): 50 mg via ORAL
  Filled 2024-05-31 (×3): qty 2
  Filled 2024-05-31: qty 1
  Filled 2024-05-31 (×6): qty 2

## 2024-05-31 MED ORDER — ATORVASTATIN CALCIUM 80 MG PO TABS
80.0000 mg | ORAL_TABLET | Freq: Every evening | ORAL | Status: DC
  Administered 2024-05-31 – 2024-06-02 (×3): 80 mg via ORAL
  Filled 2024-05-31 (×3): qty 1

## 2024-05-31 MED ORDER — SODIUM CHLORIDE 0.9% FLUSH: 10.0000 mL | INTRAVENOUS | Status: DC | PRN

## 2024-05-31 MED ORDER — ENOXAPARIN SODIUM 40 MG/0.4ML IJ SOSY
40.0000 mg | PREFILLED_SYRINGE | INTRAMUSCULAR | Status: DC
  Administered 2024-05-31: 40 mg via SUBCUTANEOUS
  Filled 2024-05-31: qty 0.4

## 2024-05-31 MED ORDER — ASPIRIN 325 MG PO TBEC
325.0000 mg | DELAYED_RELEASE_TABLET | Freq: Every day | ORAL | Status: DC
  Administered 2024-05-31 – 2024-06-01 (×2): 325 mg via ORAL
  Filled 2024-05-31 (×2): qty 1

## 2024-05-31 MED ORDER — OXYCODONE HCL 5 MG PO TABS
10.0000 mg | ORAL_TABLET | Freq: Four times a day (QID) | ORAL | Status: DC | PRN
  Administered 2024-05-31 – 2024-06-03 (×8): 10 mg via ORAL
  Filled 2024-05-31 (×8): qty 2

## 2024-05-31 MED ORDER — MORPHINE SULFATE (PF) 2 MG/ML IV SOLN
2.0000 mg | Freq: Once | INTRAVENOUS | Status: AC
  Administered 2024-05-31: 2 mg via INTRAVENOUS
  Filled 2024-05-31: qty 1

## 2024-05-31 MED ORDER — CALCIUM CARBONATE ANTACID 500 MG PO CHEW
1.0000 | CHEWABLE_TABLET | Freq: Four times a day (QID) | ORAL | Status: DC | PRN
  Administered 2024-05-31: 200 mg via ORAL
  Filled 2024-05-31: qty 1

## 2024-05-31 MED ORDER — MIDAZOLAM HCL 2 MG/2ML IJ SOLN
0.5000 mg | Freq: Once | INTRAMUSCULAR | Status: AC
  Administered 2024-05-31: 0.5 mg via INTRAVENOUS
  Filled 2024-05-31: qty 2

## 2024-05-31 NOTE — Progress Notes (Signed)
 PROGRESS NOTE    Brenda [name]  FMW:[id number] DOB: [birth year] DOA: 05/30/2024 PCP: Lonn Hicks, MD    Brief Narrative:   Brenda [name] is a 63 y.o. 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  Reports sudden onset of symptoms around 2130 which gradually improved.  Patient reports symptoms relatively resolved and feels back to normal.  Additionally patient reports over the last few weeks, has noted rmal. Denies any other new symptoms this evening. However, recently over the past few weeks has noted R sided headaches, associated episodes of nausea and vomiting, and seeing colors in her R visual field. No other seizure like activity noted. She has also had weakness mostly in the left leg, especially in the hip flexor over the past month which she feels is unchanged. She has had multiple falls related to weakness in this leg, but none in the past week or so. Had not noted any acute visual change tonight or change in weakness, numbness. She also notes she had a recent retinal procedure on the left eye, for what sounds like diabetic retinopathy and noted that L pupil has been more dilated since this time. Otherwise notes that she had recent UTI and completed a course of antibiotics as outpatient, no recurrent urinary symptoms.   In the ED, temperature 98.7 F, HR 68, RR 28, BP 120/54, SpO2 100% on 2 L nasal cannula.  WBC 7.1, hemoglobin 8.1, platelet count 193.  Sodium 133, potassium 4.1, chloride 98, CO2 23, glucose 280, BUN 8, creatinine 0.77.  AST 18, ALT  7, total bilirubin 0.3.  Urinalysis unrevealing.  Chest x-ray with no active cardiopulmonary disease process.  CT head without contrast with new low-density areas in the posterior right temporal lobe and occipital lobe concerning for acute to subacute infarcts.  EDP discussed with neurology, Dr. Vanessa, recommends for admission to [facility], stroke workup including MRI brain w w/o contrast, holding wellbutrin  in case seizure events.  [facility] was consulted for admission.  Assessment & Plan:    Acute-subacute ischemic stroke v. Possible malignant lesion  P/with episode of expressive aphasia / dysarthria, which has improved. More subacute HA, n/V and visual color hallucinations on the R x few weeks. LKWT 9/27 at 2130. Exam with slight dysarthria, no residual aphasia, + reported colorful hallucinations right eye and mild L lower facial droop, bilateral hip flexor weakness, CT Head demonstrates hypodense lesion R posterior temporal and occipital lobes concerning for acute/subacute infarct, although would consider malignant lesions with her history. not candidate for TNK as outside window.  EDP discussed with Neurology Dr. Khaliqdina, recommends for evaluation with MRI brain w w/o contrast, holding wellbutrin  in case seizure events.; transfer to [facility]  -- LDL 28 -- Hemoglobin A1c: pending -- MRI brain with and without contrast: Pending -- Routine EEG: Pending -- Atorvastatin  80 mg   - Permissive HTN <220/120 given acute event  - Serial neurochecks  - Telemonitoring - PT/OT/SLP - Swallow screen then [facility] diet  -- Holding bupropion  in case of seizures.   Metastatic Endometrial carcinoma:  Hx hysterectomy, BSO, XRT, relapsed disease with Bx proven met to RP mass, suspected RUL lung  met, who is currently receving chemoimmunotherapy with Keytruda , Taxol , Carboplatin  (C1 on 8/29), follows with Dr. Lonn outpatient.   CAD with PCI:  LAD/Dx PCI in '00 with STEMI in '21 s/p DES to mid LAD with overlapping stent; Was  on Praugrel 5 mg; However now presenting with CNS lesion -- Will hold antiplatelet until ensure this is not a metastatic lesion   HFpEF, compensated Not on diuretic OP   OSA/OHS with CHRF on 2L O2:  -- continue home O2. CPAP at bedtime   HTN:  -- Hold home losartan  for permissive hypertension  DM type 2, uncontrolled:  Recent A1c 9.3% in 8/25. With hyperglycemia, Home regimen is Tresiba 24 units at bedtime, metformin -- Lantus  20 units Dickenson at bedtime -- Sensitive SSI for coverage -- CBGs qAC/HS   HLD:  -- Atorvastatin  80 mg p.o. daily  Depression:  -- Continue home fluoxetine  -- hold bupropion  for now.   Chronic pain/malignancy related pain:  -- Continue home oxycodone  10 mg every 6 hours as needed -- Lyrica  50 mg twice daily  Obesity, class II Body mass index is 34.27 kg/m.  Chronic anemia:  Hb stable ~ 8, follow with heme /onc op    DVT prophylaxis:     Code Status: Full Code Family Communication: Updated spouse present at bedside this morning  Disposition Plan:  Level of care: Telemetry Medical Status is: Inpatient Remains inpatient appropriate because: Further neurological workup    Consultants:  Neurology  Procedures:  None  Antimicrobials:  None   Subjective: Patient seen examined bedside, lying comfortably in bed.  Spouse present at bedside.  No specific complaints this morning.  Remains in the ED holding area.  Awaiting MRI brain.  Also awaiting transfer to [facility].  Currently denies headache, no dizziness, no chest pain, no palpitations, no shortness of breath, no abdominal pain, no fever/chills/night sweats, no nausea/vomiting/diarrhea, no fatigue, no paresthesias.  No acute events overnight per nursing staff.  Objective: Vitals:   05/31/24 0600 05/31/24 0630 05/31/24 0747 05/31/24 0830  BP: (!) 105/54 (!) 118/57 117/60 123/62  Pulse: (!) 56 (!) 59 66 67  Resp: 16 16 16 16   Temp:   97.6 F (36.4 C)   TempSrc:   Oral   SpO2: 100% 99%  99% 100%  Weight:      Height:       No intake or output data in the 24 hours ending 05/31/24 1012 Filed Weights   05/30/24 2337  Weight: 85 kg    Examination:  Physical Exam: GEN: NAD, alert and oriented x 3, obese, chronically ill in appearance HEENT: NCAT, PERRL, EOMI, sclera clear, MMM PULM: CTAB w/o wheezes/crackles, normal respiratory effort, on room air with SpO2 100% at rest CV: RRR w/o M/G/R GI: abd soft, NTND, NABS, no R/G/M MSK: no peripheral edema, muscle strength globally intact 5/5 bilateral upper/lower extremities NEURO:  Alert and interactive, oriented, speech with slight dysarthria without aphasia. CN II / III with ? Relative APD on the L. OS minimally reactive 5 mm, OS 3->2. Anisocoria OS > OD, VF with OD nasal superior defect. Otherwise VF intact. CN VII, mild L lower facial droop. Otherwise IV; VI-XII intact. Motor 5/5 in the upper extremities. 4/5 bilaterally in hip flexor, otherwise 5/5 in the lower extremities, sensation intact and equal, and no clonus at the ankles.  PSYCH: normal mood/affect Integumentary: dry/intact, no rashes or wounds    Data Reviewed: I have personally reviewed following labs and imaging studies  CBC: Recent  Labs  Lab 05/28/24 1427 05/30/24 2350 05/31/24 0011  WBC 8.8 7.1  --   NEUTROABS 6.8 5.3  --   HGB 8.8* 8.1* 8.5*  HCT 27.8* 26.9* 25.0*  MCV 86.3 90.6  --   PLT 215 193  --    Basic Metabolic Panel: Recent Labs  Lab 05/28/24 1427 05/30/24 2350 05/31/24 0011  NA 138 133* 134*  K 3.8 4.1 4.0  CL 102 98 97*  CO2 31 23  --   GLUCOSE 153* 280* 287*  BUN 8 8 7*  CREATININE 0.53 0.77 0.80  CALCIUM  8.6* 8.8*  --    GFR: Estimated Creatinine Clearance: 74.7 mL/min (by C-G formula based on SCr of 0.8 mg/dL). Liver Function Tests: Recent Labs  Lab 05/28/24 1427 05/30/24 2350  AST 11* 18  ALT 6 7  ALKPHOS 90 90  BILITOT 0.3 0.3  PROT 6.5 5.8*  ALBUMIN 3.3* 3.1*   No results for input(s): LIPASE, AMYLASE in  the last 168 hours. Recent Labs  Lab 05/30/24 2350  AMMONIA 18   Coagulation Profile: No results for input(s): INR, PROTIME in the last 168 hours. Cardiac Enzymes: No results for input(s): CKTOTAL, CKMB, CKMBINDEX, TROPONINI in the last 168 hours. BNP (last 3 results) Recent Labs    05/22/24 0630  PROBNP 3,514.0*   HbA1C: No results for input(s): HGBA1C in the last 72 hours. CBG: Recent Labs  Lab 05/31/24 0035 05/31/24 0316 05/31/24 0806  GLUCAP 235* 283* 314*   Lipid Profile: Recent Labs    05/30/24 2350  CHOL 100  HDL 38*  LDLCALC 28  TRIG 829*  CHOLHDL 2.6   Thyroid  Function Tests: No results for input(s): TSH, T4TOTAL, FREET4, T3FREE, THYROIDAB in the last 72 hours. Anemia Panel: No results for input(s): VITAMINB12, FOLATE, FERRITIN, TIBC, IRON, RETICCTPCT in the last 72 hours. Sepsis Labs: No results for input(s): PROCALCITON, LATICACIDVEN in the last 168 hours.  Recent Results (from the past 240 hours)  MRSA Next Gen by PCR, Nasal     Status: None   Collection Time: 05/21/24 11:45 PM   Specimen: Nasal Mucosa; Nasal Swab  Result Value Ref Range Status   MRSA by PCR Next Gen NOT DETECTED NOT DETECTED Final    Comment: (NOTE) The GeneXpert MRSA Assay (FDA approved for NASAL specimens only), is one component of a comprehensive MRSA colonization surveillance program. It is not intended to diagnose MRSA infection nor to guide or monitor treatment for MRSA infections. Test performance is not FDA approved in patients less than 8 years old. Performed at [facility], [address] [place], [place] [address]   Urine Culture     Status: Abnormal   Collection Time: 05/22/24  1:29 AM   Specimen: Urine, Random  Result Value Ref Range Status   Specimen Description   Final    URINE, RANDOM Performed at [facility], [address], [place], [place] [address]    Special Requests    Final    NONE Reflexed from [id number] Performed at [facility], [address], [place], [place] [address]    Culture (A)  Final    20,000 COLONIES/mL ENTEROCOCCUS FAECIUM WITHIN MIXED ORGANISMS Performed at [facility], [address] [place], [place] [address]    Report Status 05/24/2024 FINAL  Final   Organism ID, Bacteria ENTEROCOCCUS FAECIUM (A)  Final      Susceptibility   Enterococcus faecium - MIC*    AMPICILLIN <=2 SENSITIVE  Sensitive     NITROFURANTOIN 64 INTERMEDIATE Intermediate     VANCOMYCIN  <=0.5 SENSITIVE Sensitive     * 20,000 COLONIES/mL ENTEROCOCCUS FAECIUM         Radiology Studies: CT Head Wo Contrast Result Date: 05/31/2024 CLINICAL DATA:  Confusion, weakness, mental status changes EXAM: CT HEAD WITHOUT CONTRAST TECHNIQUE: Contiguous axial images were obtained from the base of the skull through the vertex without intravenous contrast. RADIATION DOSE REDUCTION: This exam was performed according to the departmental dose-optimization program which includes automated exposure control, adjustment of the mA and/or kV according to patient size and/or use of iterative reconstruction technique. COMPARISON:  04/20/2024 FINDINGS: Brain: Area of low-density noted in the right posterior temporal and occipital lobes adjacent to the right lateral ventricle, new since prior study concerning for acute to subacute infarcts. No hemorrhage or hydrocephalus. No mass effect or midline shift. Vascular: No hyperdense vessel or unexpected calcification. Skull: No acute calvarial abnormality. Sinuses/Orbits: No acute findings Other: None IMPRESSION: New low-density areas in the posterior right temporal lobe and occipital lobe concerning for acute to subacute infarcts. These are new since prior study. Electronically Signed   By: Franky Crease M.D.   On: 05/31/2024 01:20   DG Chest Port 1 View Result Date: 05/31/2024 CLINICAL DATA:  Altered mental status EXAM: PORTABLE  CHEST 1 VIEW COMPARISON:  05/21/2024 FINDINGS: Stable chronic elevation of the right hemidiaphragm. Right Port-A-Cath remains in place, unchanged. Cardiomediastinal contours are unchanged. No confluent airspace opacities, effusions or edema. No acute bony abnormality. IMPRESSION: No active disease. Electronically Signed   By: Franky Crease M.D.   On: 05/31/2024 00:20        Scheduled Meds:  [START ON 06/01/2024]  stroke: early stages of recovery book   Does not apply Once   atorvastatin   80 mg Oral QPM   FLUoxetine   40 mg Oral Daily   insulin  aspart  0-5 Units Subcutaneous QHS   insulin  aspart  0-9 Units Subcutaneous TID WC   insulin  glargine  20 Units Subcutaneous QHS   pregabalin   50 mg Oral TID   Continuous Infusions:   LOS: 0 days    Time spent: 52 minutes spent on 05/31/2024 caring for this patient face-to-face including chart review, ordering labs/tests, documenting, discussion with nursing staff, consultants, updating family and interview/physical exam    Camellia PARAS [place], DO [facility] Available via Epic secure chat 7am-7pm After these hours, please refer to coverage provider listed on [email or web address] 05/31/2024, 10:12 AM

## 2024-05-31 NOTE — Consult Note (Addendum)
 Stroke Neurology Consultation Note  Consult Requested by: Dr. Royal  Reason for Consult: stroke  Consult Date: 05/31/24   The history was obtained from the pt and husband.  During history and examination, all items were able to obtain unless otherwise noted.  History of Present Illness:  Brenda [name] is a 63 y.o. Caucasian female with PMH of hypertension, hyperlipidemia, stage IV metastatic uterine cancer, diabetes, OSA on CPAP, chronic respiratory failure on home O2, and CAD status post stenting admitted for stroke.  Per husband, patient had some mild confusion and occasionally word finding difficulty at baseline since chemotherapy.  However, 9 PM last night, husband was having conversation with her and then she had acute onset speech difficulty, stated initially  I am not feeling good and then had word finding difficulty, say  I do not know for most answers, not able to communicate with husband.  EMS was called and patient brought to [facility] for evaluation.  MRI showed embolic infarcts, patient transferred to [facility] for further workup.  Overnight, patient speech much improved and patient and husband considered her speech at baseline at this moment.  Per patient, she had on and off headache and nauseous feeling since recent chemotherapy.  Also has intermittent left leg give out at baseline.  Patient had a CAD/NSTEMI in 2000 status post stenting, again had a stenting in 10/2019.  Allergic to Plavix, on Effient  5 daily and Lipitor  80.  In 04/2024, patient was admitted for lethargy and confusion.  Found to be in DKA.  CT no acute finding.  CT head and neck no LVO.  MRI no acute infarct.  EF 60 to 65%.  LDL 40, A1c 9.2.  Her symptoms are considered as encephalopathy in the setting of DKA. She was continued on Effient  and Lipitor  80.  Patient followed with Dr. Lonn for metastatic uterine cancer which diagnosed in 2023 status post surgery and radiation treatment.  Found to  have recurrent cancer in 03/2024 with severe back pain and CT showed recurrent disease in the retroperitoneal lymph node.  CT of chest showed lung mass suspicious for metastatic lesion.  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  LSN: 9 PM yesterday 9/27 TNK Given: No: Outside window IR Thrombectomy? No, no LVO Modified Rankin Scale: 3-Moderate disability-requires help but walks WITHOUT assistance  Past Medical History:  Diagnosis Date   Anemia    Arthritis    back, hands, hips (02/22/2015)   CAD (coronary artery disease)    a. complex LAD/diagonal bifurcation PCI in 2010. b. STEMI 10/2019 s/p PTCA/DES x1 to mLAD overlapping the old stent, residual disease treated medicaly.   Cancer [facility])    uterine   CHF (congestive heart failure) (HCC)    Depression    Diabetes mellitus (HCC)    started when I was pregnant; not sure if it was type 1 or type 2    History of radiation therapy    Endometrium- HDR 01/22/22-03/07/22- Dr. Lynwood Nasuti   Hypercholesterolemia    Hypertension    Morbid obesity (HCC)    Myocardial infarction (HCC)    mild x 3   Neuromuscular disorder (HCC)    neuropathy feet   Sleep apnea    cpap/    Past Surgical History:  Procedure Laterality Date   CARDIAC CATHETERIZATION  12/02/2012   CHOLECYSTECTOMY OPEN  09/04/1987   CORONARY ANGIOPLASTY WITH STENT PLACEMENT  09/03/2009   CORONARY/GRAFT ACUTE MI REVASCULARIZATION N/A 10/26/2019  Procedure: CORONARY/GRAFT ACUTE MI REVASCULARIZATION;  Surgeon: Verlin Lonni BIRCH, MD;  Location: [facility] INVASIVE CV LAB;  Service: Cardiovascular;  Laterality: N/A;   INCISIONAL HERNIA REPAIR N/A 12/09/2021   Procedure: LAPAROSCOPIC REPAIR UMBILICAL HERNIA WITH MESH;  Surgeon: Vernetta Berg, MD;  Location: [facility] ORS;  Service: General;  Laterality: N/A;   IR IMAGING GUIDED PORT INSERTION  04/08/2024   LEFT HEART CATH AND CORONARY ANGIOGRAPHY N/A 10/26/2019    Procedure: LEFT HEART CATH AND CORONARY ANGIOGRAPHY;  Surgeon: Verlin Lonni BIRCH, MD;  Location: [facility] INVASIVE CV LAB;  Service: Cardiovascular;  Laterality: N/A;   LEFT HEART CATHETERIZATION WITH CORONARY ANGIOGRAM N/A 12/25/2012   Procedure: LEFT HEART CATHETERIZATION WITH CORONARY ANGIOGRAM;  Surgeon: Ozell Fell, MD;  Location: [facility] CATH LAB;  Service: Cardiovascular;  Laterality: N/A;   RADIOLOGY WITH ANESTHESIA N/A 04/21/2024   Procedure: MRI WITH ANESTHESIA;  Surgeon: Radiologist, Medication, MD;  Location: [facility] OR;  Service: Radiology;  Laterality: N/A;  BRAIN W/ W/O   ROBOTIC ASSISTED TOTAL HYSTERECTOMY WITH BILATERAL SALPINGO OOPHERECTOMY N/A 12/06/2021   Procedure: XI ROBOTIC ASSISTED TOTAL HYSTERECTOMY WITH BILATERAL SALPINGO OOPHORECTOMY;  Surgeon: [name] SAUNDERS, MD;  Location: [facility] ORS;  Service: Gynecology;  Laterality: N/A;   UMBILICAL HERNIA REPAIR  05/04/1989   doesn't think they used mesh    Family History  Problem Relation Age of Onset   Coronary artery disease Mother        in her mid 24s   Hypertension Mother    Cancer Mother        skin   Heart attack Father        in his mid 65s   COPD Father    Stroke Sister    Coronary artery disease Sister    Diabetes Sister    Other Half-Sister    Thyroid  cancer Daughter    Prostate cancer Neg Hx    Colon cancer Neg Hx    Breast cancer Neg Hx    Endometrial cancer Neg Hx    Ovarian cancer Neg Hx    Pancreatic cancer Neg Hx     Social History:  reports that she quit smoking about 37 years ago. Her smoking use included cigarettes and cigars. She started smoking about 38 years ago. She has a 0.5 pack-year smoking history. She has never used smokeless tobacco. She reports that she does not drink alcohol and does not use drugs.  Allergies:  Allergies  Allergen Reactions   Hydrocodone Shortness Of Breath, Dermatitis and Other (See Comments)    I forget to breathe.   Clopidogrel Rash and Dermatitis    No current  facility-administered medications on file prior to encounter.   Current Outpatient Medications on File Prior to Encounter  Medication Sig Dispense Refill   acetaminophen  (TYLENOL ) 500 MG tablet Take 1-2 tablets (500-1,000 mg total) by mouth every 8 (eight) hours as needed for mild pain (pain score 1-3) or moderate pain (pain score 4-6). (Patient taking differently: Take 500 mg by mouth daily as needed for mild pain (pain score 1-3) or moderate pain (pain score 4-6).)     atorvastatin  (LIPITOR ) 80 MG tablet Take 1 tablet (80 mg total) by mouth every evening. 30 tablet 6   buPROPion  (WELLBUTRIN  XL) 150 MG 24 hr tablet Take 150 mg by mouth in the morning.     Cholecalciferol  (VITAMIN D3 SUPER STRENGTH) 50 MCG (2000 UT) CAPS Take 2,000 Units by mouth in the morning.     cyproheptadine  (PERIACTIN ) 4 MG tablet Take  4 mg by mouth 3 (three) times daily.     diphenhydrAMINE  (BENADRYL ) 25 mg capsule Take 25 mg by mouth at bedtime as needed for sleep.     FIASP  FLEXTOUCH 100 UNIT/ML FlexTouch Pen Inject 5 Units into the skin with breakfast, with lunch, and with evening meal.     FLUoxetine  (PROZAC ) 40 MG capsule Take 40 mg by mouth daily.     guaiFENesin  (MUCINEX ) 600 MG 12 hr tablet Take 1 tablet (600 mg total) by mouth 2 (two) times daily as needed for cough or to loosen phlegm. 30 tablet 0   insulin  degludec (TRESIBA) 100 UNIT/ML FlexTouch Pen Inject 24 Units into the skin every evening.     lactulose  (CHRONULAC ) 10 GM/15ML solution Take 45 mLs (30 g total) by mouth 2 (two) times daily as needed for mild constipation or moderate constipation. 236 mL 0   lidocaine  (LIDODERM ) 5 % Place 1 patch onto the skin daily.     lidocaine -prilocaine  (EMLA ) cream Apply to affected area once (Patient taking differently: Apply 1 Application topically as needed [facility]). Apply to affected area once) 30 g 3   losartan  (COZAAR ) 25 MG tablet Take 1 tablet (25 mg total) by mouth daily. 90 tablet 3   metFORMIN  (GLUCOPHAGE-XR) 500 MG 24 hr tablet Take 1,000 mg by mouth in the morning and at bedtime.     Misc Natural Products (NEURIVA PO) Take 1 tablet by mouth daily.     Misc. Devices MISC Portable oxygen  concentrator, 2L oxygen .  Diagnoses-chronic respiratory failure with hypoxia 1 each 0   nitroGLYCERIN  (NITROSTAT ) 0.4 MG SL tablet Dissolve 1 tablet under the tongue every 5 minutes as needed for chest pain. Max of 3 doses, then 911. 25 tablet 6   ondansetron  (ZOFRAN ) 8 MG tablet Take 1 tablet (8 mg total) by mouth every 8 (eight) hours as needed for nausea or vomiting. Start on the third day after carboplatin . 30 tablet 1   Oxycodone  HCl 10 MG TABS Take 1 tablet (10 mg total) by mouth every 6 (six) hours as needed. 90 tablet 0   OXYGEN  Inhale 2 L/min into the lungs continuous.     polyethylene glycol powder (GLYCOLAX /MIRALAX ) 17 GM/SCOOP powder Take 17 g by mouth daily as needed for mild constipation.     prasugrel  (EFFIENT ) 5 MG TABS tablet Take 1 tablet (5 mg total) by mouth in the morning. AFTER TAKING YOUR DOSE ON 04/07/2024, STOP TAKING IT IN PREPARATION FOR THE BIOPSY ON 04/15/2024. AFTER THE BIOPSY, FOLLOW THE RADIOLOGIST RECOMMENDATIONS AS TO TIMING OF RESTARTING THIS MEDICINE.     pregabalin  (LYRICA ) 50 MG capsule Take 1 capsule (50 mg total) by mouth 2 (two) times daily. (Patient taking differently: Take 50 mg by mouth 3 (three) times daily.) 60 capsule 0   prochlorperazine  (COMPAZINE ) 10 MG tablet Take 1 tablet (10 mg total) by mouth every 6 (six) hours as needed for nausea or vomiting. 30 tablet 1   senna-docusate (SENOKOT-S) 8.6-50 MG tablet Take 1-2 tablets by mouth 2 (two) times daily between meals as needed for mild constipation or moderate constipation. (Patient taking differently: Take 1-2 tablets by mouth as needed for mild constipation or moderate constipation.)      Review of Systems: A full ROS was attempted today and was able to be performed.  Systems assessed include - Constitutional,  Eyes, HENT, Respiratory, Cardiovascular, Gastrointestinal, Genitourinary, Integument/breast, Hematologic/lymphatic, Musculoskeletal, Neurological, Behavioral/Psych, Endocrine, Allergic/Immunologic - with pertinent responses as per HPI.  Physical Examination: Temp:  [  97.6 F (36.4 C)-98.7 F (37.1 C)] 98 F (36.7 C) (09/28 1523) Pulse Rate:  [56-80] 68 (09/28 1523) Resp:  [15-28] 15 (09/28 1523) BP: (103-150)/(41-74) 120/56 (09/28 1523) SpO2:  [97 %-100 %] 99 % (09/28 1523) Weight:  [85 kg] 85 kg (09/27 2337)  General - well nourished, well developed, in no apparent distress.    Ophthalmologic - fundi not visualized due to noncooperation.    Cardiovascular - regular rhythm and rate  Neuro - awake, alert, eyes open, orientated to place, time and people, told me age 88 instead of 79. No aphasia, paucity of speech, following all simple commands with mild psychomotor slowing. Able to name and repeat. No gaze palsy, tracking bilaterally, visual field full however patient has diabetic retinopathy with tunnel vision, PERRL. No significant facial droop. Tongue midline. Bilateral UEs 4/5, no drift. Bilaterally LEs 4/5, no drift. Sensation symmetrical bilaterally, b/l FTN intact, gait not tested.    Data Reviewed: MR Brain W and Wo Contrast Result Date: 05/31/2024 CLINICAL DATA:  63 year old female with metastatic endometrial cancer. Confusion, weakness, neurologic deficit. EXAM: MRI HEAD WITHOUT AND WITH CONTRAST TECHNIQUE: Multiplanar, multiecho pulse sequences of the brain and surrounding structures were obtained without and with intravenous contrast. CONTRAST:  8mL GADAVIST  GADOBUTROL  1 MMOL/ML IV SOLN COMPARISON:  Head CT without contrast 0101 hours today. Brain MRI 04/21/2024. FINDINGS: Brain: Confluent restricted diffusion in the right PCA territory from the posterior mesial temporal lobe, patchy right occipital lobe involvement, periatrial white matter involvement and marginal involvement of the  splenium of the corpus callosum. Additional numerous small foci of restricted diffusion scattered in the bilateral cerebellum and cerebral hemispheres. Anterior and posterior circulation affected bilaterally. Deep gray nuclei and brainstem are relatively spared. Cytotoxic edema in the affected areas. No intracranial hemorrhage identified. Luxury perfusion enhancement appearance in the right PCA territory (series 17, image 13). No masslike or suspicious postcontrast enhancement. No dural thickening. No midline shift, ventriculomegaly, evidence of discrete intracranial mass. Cervicomedullary junction and pituitary are within normal limits. No abnormal enhancement identified. Evidence of mass lesion, ventriculomegaly, extra-axial collection or acute intracranial hemorrhage. Cervicomedullary junction and pituitary are within normal limits. Vascular: Major intracranial vascular flow voids are preserved. Skull and upper cervical spine: Visualized bone marrow signal is within normal limits. Negative visible cervical spine and spinal cord. Sinuses/Orbits: Negative. Other: Stable mild mastoid effusion since last month. IMPRESSION: 1. Numerous small acute embolic infarcts in the bilateral anterior and posterior circulation, with superimposed confluent Right PCA territory infarct corresponding to that seen on CT this morning. 2. Cytotoxic edema with no intracranial hemorrhage or mass effect. 3. No intracranial metastatic disease identified. Electronically Signed   By: VEAR Hurst M.D.   On: 05/31/2024 10:49   CT Head Wo Contrast Result Date: 05/31/2024 CLINICAL DATA:  Confusion, weakness, mental status changes EXAM: CT HEAD WITHOUT CONTRAST TECHNIQUE: Contiguous axial images were obtained from the base of the skull through the vertex without intravenous contrast. RADIATION DOSE REDUCTION: This exam was performed according to the departmental dose-optimization program which includes automated exposure control, adjustment of the  mA and/or kV according to patient size and/or use of iterative reconstruction technique. COMPARISON:  04/20/2024 FINDINGS: Brain: Area of low-density noted in the right posterior temporal and occipital lobes adjacent to the right lateral ventricle, new since prior study concerning for acute to subacute infarcts. No hemorrhage or hydrocephalus. No mass effect or midline shift. Vascular: No hyperdense vessel or unexpected calcification. Skull: No acute calvarial abnormality. Sinuses/Orbits: No acute findings  Other: None IMPRESSION: New low-density areas in the posterior right temporal lobe and occipital lobe concerning for acute to subacute infarcts. These are new since prior study. Electronically Signed   By: Franky Crease M.D.   On: 05/31/2024 01:20   DG Chest Port 1 View Result Date: 05/31/2024 CLINICAL DATA:  Altered mental status EXAM: PORTABLE CHEST 1 VIEW COMPARISON:  05/21/2024 FINDINGS: Stable chronic elevation of the right hemidiaphragm. Right Port-A-Cath remains in place, unchanged. Cardiomediastinal contours are unchanged. No confluent airspace opacities, effusions or edema. No acute bony abnormality. IMPRESSION: No active disease. Electronically Signed   By: Franky Crease M.D.   On: 05/31/2024 00:20   DG CHEST PORT 1 VIEW Result Date: 05/21/2024 CLINICAL DATA:  10031 Cough 10031 EXAM: PORTABLE CHEST 1 VIEW COMPARISON:  Chest x-ray 05/04/2024, CT abdomen pelvis 05/04/2024 FINDINGS: Right chest wall accessed Port-A-Cath with tip overlying the expected region of the superior collection. The heart and mediastinal contours are unchanged. Persistent elevation of the right hemidiaphragm. No focal consolidation. No pulmonary edema. No pleural effusion. No pneumothorax. No acute osseous abnormality. IMPRESSION: No active disease. Electronically Signed   By: Morgane  Naveau M.D.   On: 05/21/2024 23:50   CT ABDOMEN PELVIS W CONTRAST Result Date: 05/04/2024 CLINICAL DATA:  63 year old female with abdominal  pain. Metastatic endometrial cancer. * Tracking Code: BO * EXAM: CT ABDOMEN AND PELVIS WITH CONTRAST TECHNIQUE: Multidetector CT imaging of the abdomen and pelvis was performed using the standard protocol following bolus administration of intravenous contrast. RADIATION DOSE REDUCTION: This exam was performed according to the departmental dose-optimization program which includes automated exposure control, adjustment of the mA and/or kV according to patient size and/or use of iterative reconstruction technique. CONTRAST:  OMNIPAQUE  IOHEXOL  300 MG/ML  SOLN COMPARISON:  CT Abdomen and Pelvis 03/29/2024. FINDINGS: Lower chest: Stable elevation of the right hemidiaphragm. Borderline to mild cardiomegaly is stable. No pericardial effusion. Trace layering left pleural effusion is new since July. And there is confluent new posterior basal segment left lower lobe peribronchial airspace opacity with air bronchograms (series 6, image 51). The visible airways remain patent. No suspicious lung base nodule or discrete mass. Hepatobiliary: Chronically absent gallbladder and stable prominence of intra- and extrahepatic biliary ducts since July. No discrete liver mass. Pancreas: Stable atrophy. Spleen: Stable and negative. Adrenals/Urinary Tract: Mass effect on the left kidney from bulky, which left abdominal retroperitoneal, paraspinal, pararenal space mass is further detailed below. Invasion of the left pararenal space as before. Involvement of the left main renal vessels also (series 2, image 38). But renal enhancement remain symmetric. Renal contrast excretion is symmetric. The left ureter is located along the anterior margin of the large tumor and is nondilated. Adrenal glands remain within normal limits. Bladder is negative. Stomach/Bowel: Nondilated large and small bowel. Redundant sigmoid colon. Mild large bowel retained stool. Cecum partially located in the pelvis. Decompressed terminal ileum. Normal appendix on  series 2, image 73. Small fat containing ventral abdominal hernia near the midline on series 2, image 67 is stable. No herniated bowel. Stomach and duodenum appear negative. No pneumoperitoneum. No free fluid. Vascular/Lymphatic: Large and heterogeneously enhancing nearly 10 cm left abdominal retroperitoneal, paraspinal, pararenal space mass. This may be severe ex nodal tumor, and has progressed since July which is further detailed below. That tumor is inseparable from the infrarenal abdominal aorta which remains patent on series 2, image 44. Tumor extends to the aortoiliac bifurcation which is patent. Underlying mild for age Aortoiliac calcified atherosclerosis. More advanced  calcified femoral artery atherosclerosis. Major arterial branches remain patent. Portal venous system and IVC also appear patent. Left external iliac lymphadenopathy is stable measuring up to 18 mm short axis. No other lymphadenopathy. Reproductive: Surgically absent. Other: No pelvis free fluid. 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 But subtle if any interval erosion into the adjacent L2 and L3 vertebral bodies (perhaps on series 2, image 41). No discrete bone metastasis by CT. Body wall edema not significantly changed since July. IMPRESSION: 1. Very large and progressed since July Left abdominal tumor occupying and invading the lumbar paraspinal, retroperitoneal, para-aortic, and pararenal spaces. Tumor is now up to 9.5 cm transaxial, directly involves the left renal vessels and abuts the aorta - which remain patent. Early if any invasion into the adjacent L2 and L3 lumbar vertebrae. 2. Confluent new Left lower lobe posterior basal segment peribronchial consolidation, more suspicious for Pneumonia or Aspiration than  metastatic disease. Small new layering left pleural effusion. 3. Stable left external iliac lymphadenopathy. No brand new metastasis is identified in the abdomen or pelvis. Electronically Signed   By: VEAR Hurst M.D.   On: 05/04/2024 04:04   DG Chest Port 1 View Result Date: 05/04/2024 CLINICAL DATA:  Dyspnea EXAM: PORTABLE CHEST 1 VIEW COMPARISON:  04/20/2024 FINDINGS: Stable elevation of the right hemidiaphragm. Lungs are clear. No pneumothorax or pleural effusion. Right internal jugular chest port tip seen within the superior cavoatrial junction. Cardiac size within limits. Pulmonary vascularity is normal. No acute bone abnormality. IMPRESSION: 1. No active disease. Electronically Signed   By: Dorethia Molt M.D.   On: 05/04/2024 02:47    Assessment: 63 y.o. female with PMH of hypertension, hyperlipidemia, stage IV metastatic uterine cancer, diabetes, OSA on CPAP, chronic respiratory failure on home O2, and CAD status post stenting admitted for acute aphasia around 9 PM yesterday.  Overnight, patient speech much improved and patient and husband considered her speech at baseline at this moment.  CT head concerning for acute to subacute infarct at right temporal lobe and occipital lobe.  MRI showed numerous small acute embolic infarct in the bilateral anterior and posterior circulations, consistent with embolic source.  Given patient medical history, current stroke most likely due to hypercoagulable state from advanced malignancy.  Patient likely need anticoagulation.  Effient  seems contraindicated in stroke, will start aspirin  325 today and will check with cardiology in am regarding eliquis given her CAD hx on effient .  Will also check LE venous Doppler to rule out DVT.  Will continue statin.  Management of DM and cancer related pain per primary.  Plan: - Patient had recent stroke workup, no need to repeat vessel imaging or LDL/A1c. - Put on aspirin  325 today, DC Effient  which seem to be contraindicated in  stroke.  Will discuss with cardiology then regarding Eliquis given extensive history of CAD on Effient  - Okay to continue statin today - LE venous Doppler to rule out DVT - Management of DM and cancer related pain per primary - Will follow.  Thank you for this consultation and allowing us  to participate in the care of this patient.  Ary Cummins, MD PhD Stroke Neurology 05/31/2024 7:37 PM

## 2024-05-31 NOTE — ED Notes (Signed)
 Gave report to Ginger RN on 3W.

## 2024-05-31 NOTE — Progress Notes (Signed)
 EEG complete - results pending

## 2024-05-31 NOTE — ED Notes (Signed)
 RN provided pt wc and pt husband assisted patient to bathroom.  Pt assisted back into bed by pt husband and this RN.

## 2024-05-31 NOTE — Plan of Care (Signed)
 Problem: Education: Goal: Ability to describe self-care measures that may prevent or decrease complications (Diabetes Survival Skills Education) will improve 05/31/2024 1737 by Lum Look, RN Outcome: Progressing 05/31/2024 1737 by Lum Look, RN Outcome: Progressing Goal: Individualized Educational Video(s) 05/31/2024 1737 by Lum Look, RN Outcome: Progressing 05/31/2024 1737 by Lum Look, RN Outcome: Progressing   Problem: Coping: Goal: Ability to adjust to condition or change in health will improve 05/31/2024 1737 by Lum Look, RN Outcome: Progressing 05/31/2024 1737 by Lum Look, RN Outcome: Progressing   Problem: Fluid Volume: Goal: Ability to maintain a balanced intake and output will improve 05/31/2024 1737 by Lum Look, RN Outcome: Progressing 05/31/2024 1737 by Lum Look, RN Outcome: Progressing   Problem: Health Behavior/Discharge Planning: Goal: Ability to identify and utilize available resources and services will improve 05/31/2024 1737 by Lum Look, RN Outcome: Progressing 05/31/2024 1737 by Lum Look, RN Outcome: Progressing Goal: Ability to manage health-related needs will improve 05/31/2024 1737 by Lum Look, RN Outcome: Progressing 05/31/2024 1737 by Lum Look, RN Outcome: Progressing   Problem: Metabolic: Goal: Ability to maintain appropriate glucose levels will improve 05/31/2024 1737 by Lum Look, RN Outcome: Progressing 05/31/2024 1737 by Lum Look, RN Outcome: Progressing   Problem: Nutritional: Goal: Maintenance of adequate nutrition will improve 05/31/2024 1737 by Lum Look, RN Outcome: Progressing 05/31/2024 1737 by Lum Look, RN Outcome: Progressing Goal: Progress toward achieving an optimal weight will improve 05/31/2024 1737 by Lum Look, RN Outcome: Progressing 05/31/2024 1737 by Lum Look, RN Outcome: Progressing   Problem: Skin  Integrity: Goal: Risk for impaired skin integrity will decrease 05/31/2024 1737 by Lum Look, RN Outcome: Progressing 05/31/2024 1737 by Lum Look, RN Outcome: Progressing   Problem: Tissue Perfusion: Goal: Adequacy of tissue perfusion will improve 05/31/2024 1737 by Lum Look, RN Outcome: Progressing 05/31/2024 1737 by Lum Look, RN Outcome: Progressing   Problem: Education: Goal: Knowledge of disease or condition will improve 05/31/2024 1737 by Lum Look, RN Outcome: Progressing 05/31/2024 1737 by Lum Look, RN Outcome: Progressing Goal: Knowledge of secondary prevention will improve (MUST DOCUMENT ALL) 05/31/2024 1737 by Lum Look, RN Outcome: Progressing 05/31/2024 1737 by Lum Look, RN Outcome: Progressing Goal: Knowledge of patient specific risk factors will improve (DELETE if not current risk factor) 05/31/2024 1737 by Lum Look, RN Outcome: Progressing 05/31/2024 1737 by Lum Look, RN Outcome: Progressing   Problem: Ischemic Stroke/TIA Tissue Perfusion: Goal: Complications of ischemic stroke/TIA will be minimized 05/31/2024 1737 by Lum Look, RN Outcome: Progressing 05/31/2024 1737 by Lum Look, RN Outcome: Progressing   Problem: Coping: Goal: Will verbalize positive feelings about self 05/31/2024 1737 by Lum Look, RN Outcome: Progressing 05/31/2024 1737 by Lum Look, RN Outcome: Progressing Goal: Will identify appropriate support needs 05/31/2024 1737 by Lum Look, RN Outcome: Progressing 05/31/2024 1737 by Lum Look, RN Outcome: Progressing   Problem: Health Behavior/Discharge Planning: Goal: Ability to manage health-related needs will improve 05/31/2024 1737 by Lum Look, RN Outcome: Progressing 05/31/2024 1737 by Lum Look, RN Outcome: Progressing Goal: Goals will be collaboratively established with patient/family 05/31/2024 1737 by Lum Look,  RN Outcome: Progressing 05/31/2024 1737 by Lum Look, RN Outcome: Progressing   Problem: Self-Care: Goal: Ability to participate in self-care as condition permits will improve 05/31/2024 1737 by Lum Look, RN Outcome: Progressing 05/31/2024 1737 by Lum Look, RN Outcome: Progressing Goal: Verbalization of feelings and concerns over difficulty with self-care will improve 05/31/2024 1737 by Lum Look, RN Outcome: Progressing 05/31/2024 1737 by Lum Look, RN Outcome: Progressing Goal: Ability to communicate  needs accurately will improve 05/31/2024 1737 by Lum Look, RN Outcome: Progressing 05/31/2024 1737 by Lum Look, RN Outcome: Progressing   Problem: Nutrition: Goal: Risk of aspiration will decrease 05/31/2024 1737 by Lum Look, RN Outcome: Progressing 05/31/2024 1737 by Lum Look, RN Outcome: Progressing Goal: Dietary intake will improve 05/31/2024 1737 by Lum Look, RN Outcome: Progressing 05/31/2024 1737 by Lum Look, RN Outcome: Progressing   Problem: Education: Goal: Knowledge of General Education information will improve Description: Including pain rating scale, medication(s)/side effects and non-pharmacologic comfort measures 05/31/2024 1737 by Lum Look, RN Outcome: Progressing 05/31/2024 1737 by Lum Look, RN Outcome: Progressing   Problem: Health Behavior/Discharge Planning: Goal: Ability to manage health-related needs will improve 05/31/2024 1737 by Lum Look, RN Outcome: Progressing 05/31/2024 1737 by Lum Look, RN Outcome: Progressing   Problem: Clinical Measurements: Goal: Ability to maintain clinical measurements within normal limits will improve 05/31/2024 1737 by Lum Look, RN Outcome: Progressing 05/31/2024 1737 by Lum Look, RN Outcome: Progressing Goal: Will remain free from infection 05/31/2024 1737 by Lum Look, RN Outcome: Progressing 05/31/2024  1737 by Lum Look, RN Outcome: Progressing Goal: Diagnostic test results will improve 05/31/2024 1737 by Lum Look, RN Outcome: Progressing 05/31/2024 1737 by Lum Look, RN Outcome: Progressing Goal: Respiratory complications will improve 05/31/2024 1737 by Lum Look, RN Outcome: Progressing 05/31/2024 1737 by Lum Look, RN Outcome: Progressing Goal: Cardiovascular complication will be avoided 05/31/2024 1737 by Lum Look, RN Outcome: Progressing 05/31/2024 1737 by Lum Look, RN Outcome: Progressing   Problem: Activity: Goal: Risk for activity intolerance will decrease 05/31/2024 1737 by Lum Look, RN Outcome: Progressing 05/31/2024 1737 by Lum Look, RN Outcome: Progressing   Problem: Nutrition: Goal: Adequate nutrition will be maintained 05/31/2024 1737 by Lum Look, RN Outcome: Progressing 05/31/2024 1737 by Lum Look, RN Outcome: Progressing   Problem: Coping: Goal: Level of anxiety will decrease 05/31/2024 1737 by Lum Look, RN Outcome: Progressing 05/31/2024 1737 by Lum Look, RN Outcome: Progressing   Problem: Elimination: Goal: Will not experience complications related to bowel motility 05/31/2024 1737 by Lum Look, RN Outcome: Progressing 05/31/2024 1737 by Lum Look, RN Outcome: Progressing Goal: Will not experience complications related to urinary retention 05/31/2024 1737 by Lum Look, RN Outcome: Progressing 05/31/2024 1737 by Lum Look, RN Outcome: Progressing   Problem: Pain Managment: Goal: General experience of comfort will improve and/or be controlled 05/31/2024 1737 by Lum Look, RN Outcome: Progressing 05/31/2024 1737 by Lum Look, RN Outcome: Progressing   Problem: Safety: Goal: Ability to remain free from injury will improve 05/31/2024 1737 by Lum Look, RN Outcome: Progressing 05/31/2024 1737 by Lum Look, RN Outcome:  Progressing   Problem: Skin Integrity: Goal: Risk for impaired skin integrity will decrease 05/31/2024 1737 by Lum Look, RN Outcome: Progressing 05/31/2024 1737 by Lum Look, RN Outcome: Progressing

## 2024-05-31 NOTE — ED Notes (Signed)
 Pt paperwork printed.

## 2024-05-31 NOTE — Plan of Care (Signed)

## 2024-05-31 NOTE — ED Notes (Signed)
 Pt assisted to restroom via wheelchair. Pt urine sample was contaminated

## 2024-05-31 NOTE — Progress Notes (Signed)
   05/31/24 0325  BiPAP/CPAP/SIPAP  $ Non-Invasive Home Ventilator  Initial  $ Face Mask Medium Yes  BiPAP/CPAP/SIPAP Pt Type Adult  BiPAP/CPAP/SIPAP Resmed  Mask Type Full face mask  Dentures removed? Not applicable  Mask Size Medium  Respiratory Rate 18 breaths/min  EPAP 14 cmH2O  Flow Rate 2 lpm  Patient Home Machine No  Patient Home Mask No  Patient Home Tubing No  Auto Titrate No  Nasal massage performed Yes  CPAP/SIPAP surface wiped down Yes  Device Plugged into RED Power Outlet Yes  BiPAP/CPAP /SiPAP Vitals  Pulse Rate 70  Resp 18  SpO2 98 %     05/31/24 0325  BiPAP/CPAP/SIPAP  $ Non-Invasive Home Ventilator  Initial  $ Face Mask Medium Yes  BiPAP/CPAP/SIPAP Pt Type Adult  BiPAP/CPAP/SIPAP Resmed  Mask Type Full face mask  Dentures removed? Not applicable  Mask Size Medium  Respiratory Rate 18 breaths/min  EPAP 14 cmH2O  Flow Rate 2 lpm  Patient Home Machine No  Patient Home Mask No  Patient Home Tubing No  Auto Titrate No  Nasal massage performed Yes  CPAP/SIPAP surface wiped down Yes  Device Plugged into RED Power Outlet Yes  BiPAP/CPAP /SiPAP Vitals  Pulse Rate 70  Resp 18  SpO2 98 %

## 2024-05-31 NOTE — ED Notes (Signed)
 [facility] called for transport.

## 2024-05-31 NOTE — H&P (Addendum)
 History and Physical    Brenda [name] FMW:[id number] DOB: Aug 09, 1961 DOA: 05/30/2024  PCP: Lonn Hicks, MD   Patient coming from: Home   Chief Complaint:  Chief Complaint  Patient presents with   Altered Mental Status    HPI:  Brenda [name] is a 63 y.o. 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 Reports sudden onset of symptoms ~ 2130 which gradually improved and now feels that speech mostly back to normal. Denies any other new symptoms this evening. However, recently over the past few weeks has noted R sided headaches, associated episodes of nausea and vomiting, and seeing colors in her R visual field. No other seizure like activity noted. She has also had weakness mostly in the left leg, especially in the hip flexor over the past month which she feels is unchanged. She has had multiple falls related to weakness in this leg, but none in the past week or so. Had not noted any acute visual change tonight or change in weakness, numbness. She also notes she had a recent retinal procedure on the left eye, for what sounds like diabetic retinopathy and noted that L pupil has been more dilated since this time. Otherwise notes that she had recent UTI and completed a course of antibiotics as outpatient, no recurrent urinary symptoms.      Review of Systems:  ROS complete and negative except as marked above   Allergies  Allergen Reactions   Hydrocodone Shortness Of Breath, Dermatitis and Other (See Comments)    I forget to breathe.   Clopidogrel Rash and Dermatitis    Prior to Admission  medications   Medication Sig Start Date End Date Taking? Authorizing Provider  acetaminophen  (TYLENOL ) 500 MG tablet Take 1-2 tablets (500-1,000 mg total) by mouth every 8 (eight) hours as needed for mild pain (pain score 1-3) or moderate pain (pain score 4-6). Patient taking differently: Take 500 mg by mouth daily as needed for mild pain (pain score 1-3) or moderate pain (pain score 4-6). 03/30/24 03/30/25  Judeth Trenda BIRCH, MD  atorvastatin  (LIPITOR ) 80 MG tablet Take 1 tablet (80 mg total) by mouth every evening. 10/03/21   Newlin, Enobong, MD  buPROPion  (WELLBUTRIN  XL) 150 MG 24 hr tablet Take 150 mg by mouth in the morning.    [provider]  Cholecalciferol  (VITAMIN D3 SUPER STRENGTH) 50 MCG (2000 UT) CAPS Take 2,000 Units by mouth in the morning.    [provider]  cyproheptadine  (PERIACTIN ) 4 MG tablet Take 4 mg by mouth 3 (three) times daily.    [provider]  diphenhydrAMINE  (BENADRYL ) 25 mg capsule Take 25 mg by mouth at bedtime as needed for sleep.    [provider]  FIASP  FLEXTOUCH 100 UNIT/ML FlexTouch Pen Inject 5 Units into the skin with breakfast, with lunch, and with evening meal. 12/08/22   [provider]  FLUoxetine  (PROZAC ) 40 MG capsule Take 40 mg by mouth daily. 01/17/23   [provider]  guaiFENesin  (MUCINEX ) 600 MG 12 hr tablet Take 1 tablet (600 mg total) by mouth 2 (two) times daily as needed for cough or to loosen phlegm.  05/05/24   Amin, Sumayya, MD  insulin  degludec (TRESIBA) 100 UNIT/ML FlexTouch Pen Inject 24 Units into the skin every evening.    [provider]  lactulose  (CHRONULAC ) 10 GM/15ML solution Take 45 mLs (30 g total) by mouth 2 (two) times daily as needed for mild constipation or moderate constipation. 05/05/24   Caleen Qualia, MD  lidocaine  (LIDODERM ) 5 % Place 1 patch onto the skin daily. 04/27/24   [provider]  lidocaine -prilocaine  (EMLA ) cream Apply to affected area once Patient  taking differently: Apply 1 Application topically as needed [facility]). Apply to affected area once 04/16/24   Gorsuch, Ni, MD  losartan  (COZAAR ) 25 MG tablet Take 1 tablet (25 mg total) by mouth daily. 07/19/23   Cooper, Michael, MD  metFORMIN (GLUCOPHAGE-XR) 500 MG 24 hr tablet Take 1,000 mg by mouth in the morning and at bedtime. 07/05/22   [provider]  Misc Natural Products (NEURIVA PO) Take 1 tablet by mouth daily.    [provider]  Misc. Devices MISC Portable oxygen  concentrator, 2L oxygen .  Diagnoses-chronic respiratory failure with hypoxia 11/27/21   Delbert Clam, MD  nitroGLYCERIN  (NITROSTAT ) 0.4 MG SL tablet Dissolve 1 tablet under the tongue every 5 minutes as needed for chest pain. Max of 3 doses, then 911. 01/25/23   Lelon Hamilton T, PA-C  ondansetron  (ZOFRAN ) 8 MG tablet Take 1 tablet (8 mg total) by mouth every 8 (eight) hours as needed for nausea or vomiting. Start on the third day after carboplatin . 04/16/24   Lonn Hicks, MD  Oxycodone  HCl 10 MG TABS Take 1 tablet (10 mg total) by mouth every 6 (six) hours as needed. 05/28/24   Lonn Hicks, MD  OXYGEN  Inhale 2 L/min into the lungs continuous.    [provider]  polyethylene glycol powder (GLYCOLAX /MIRALAX ) 17 GM/SCOOP powder Take 17 g by mouth daily as needed for mild constipation. 09/17/23   [provider]  prasugrel  (EFFIENT ) 5 MG TABS tablet Take 1 tablet (5 mg total) by mouth in the morning. AFTER TAKING YOUR DOSE ON 04/07/2024, STOP TAKING IT IN PREPARATION FOR THE BIOPSY ON 04/15/2024. AFTER THE BIOPSY, FOLLOW THE RADIOLOGIST RECOMMENDATIONS AS TO TIMING OF RESTARTING THIS MEDICINE. 03/30/24   Judeth Trenda BIRCH, MD  pregabalin  (LYRICA ) 50 MG capsule Take 1 capsule (50 mg total) by mouth 2 (two) times daily. Patient taking differently: Take 50 mg by mouth 3 (three) times daily. 04/24/24   Gonfa, Taye T, MD  prochlorperazine  (COMPAZINE ) 10 MG tablet Take 1 tablet (10 mg total) by mouth every 6  (six) hours as needed for nausea or vomiting. 04/16/24   Lonn Hicks, MD  senna-docusate (SENOKOT-S) 8.6-50 MG tablet Take 1-2 tablets by mouth 2 (two) times daily between meals as needed for mild constipation or moderate constipation. Patient taking differently: Take 1-2 tablets by mouth as needed for mild constipation or moderate constipation. 04/24/24   Gonfa, Taye T, MD    Past Medical History:  Diagnosis Date   Anemia    Arthritis    back, hands, hips (02/22/2015)   CAD (coronary artery disease)    a. complex LAD/diagonal bifurcation PCI in 2010. b. STEMI 10/2019 s/p PTCA/DES x1 to mLAD overlapping the old stent, residual disease treated medicaly.   Cancer [facility])    uterine   CHF (congestive heart failure) (HCC)    Depression    Diabetes mellitus (HCC)    started when I was pregnant; not sure if it was type 1 or type  2    History of radiation therapy    Endometrium- HDR 01/22/22-03/07/22- Dr. Lynwood Nasuti   Hypercholesterolemia    Hypertension    Morbid obesity (HCC)    Myocardial infarction [facility])    mild x 3   Neuromuscular disorder (HCC)    neuropathy feet   Sleep apnea    cpap/    Past Surgical History:  Procedure Laterality Date   CARDIAC CATHETERIZATION  12/02/2012   CHOLECYSTECTOMY OPEN  09/04/1987   CORONARY ANGIOPLASTY WITH STENT PLACEMENT  09/03/2009   CORONARY/GRAFT ACUTE MI REVASCULARIZATION N/A 10/26/2019   Procedure: CORONARY/GRAFT ACUTE MI REVASCULARIZATION;  Surgeon: Verlin Lonni BIRCH, MD;  Location: [facility] INVASIVE CV LAB;  Service: Cardiovascular;  Laterality: N/A;   INCISIONAL HERNIA REPAIR N/A 12/09/2021   Procedure: LAPAROSCOPIC REPAIR UMBILICAL HERNIA WITH MESH;  Surgeon: Vernetta Berg, MD;  Location: [facility] ORS;  Service: General;  Laterality: N/A;   IR IMAGING GUIDED PORT INSERTION  04/08/2024   LEFT HEART CATH AND CORONARY ANGIOGRAPHY N/A 10/26/2019   Procedure: LEFT HEART CATH AND CORONARY ANGIOGRAPHY;  Surgeon: Verlin Lonni BIRCH, MD;   Location: [facility] INVASIVE CV LAB;  Service: Cardiovascular;  Laterality: N/A;   LEFT HEART CATHETERIZATION WITH CORONARY ANGIOGRAM N/A 12/25/2012   Procedure: LEFT HEART CATHETERIZATION WITH CORONARY ANGIOGRAM;  Surgeon: Ozell Fell, MD;  Location: [facility] CATH LAB;  Service: Cardiovascular;  Laterality: N/A;   RADIOLOGY WITH ANESTHESIA N/A 04/21/2024   Procedure: MRI WITH ANESTHESIA;  Surgeon: Radiologist, Medication, MD;  Location: [facility] OR;  Service: Radiology;  Laterality: N/A;  BRAIN W/ W/O   ROBOTIC ASSISTED TOTAL HYSTERECTOMY WITH BILATERAL SALPINGO OOPHERECTOMY N/A 12/06/2021   Procedure: XI ROBOTIC ASSISTED TOTAL HYSTERECTOMY WITH BILATERAL SALPINGO OOPHORECTOMY;  Surgeon: [name] SAUNDERS, MD;  Location: [facility] ORS;  Service: Gynecology;  Laterality: N/A;   UMBILICAL HERNIA REPAIR  05/04/1989   doesn't think they used mesh     reports that she quit smoking about 37 years ago. Her smoking use included cigarettes and cigars. She started smoking about 38 years ago. She has a 0.5 pack-year smoking history. She has never used smokeless tobacco. She reports that she does not drink alcohol and does not use drugs.  Family History  Problem Relation Age of Onset   Coronary artery disease Mother        in her mid 21s   Hypertension Mother    Cancer Mother        skin   Heart attack Father        in his mid 26s   COPD Father    Stroke Sister    Coronary artery disease Sister    Diabetes Sister    Other Half-Sister    Thyroid  cancer Daughter    Prostate cancer Neg Hx    Colon cancer Neg Hx    Breast cancer Neg Hx    Endometrial cancer Neg Hx    Ovarian cancer Neg Hx    Pancreatic cancer Neg Hx      Physical Exam: Vitals:   05/31/24 0030 05/31/24 0057 05/31/24 0100 05/31/24 0130  BP:  (!) 132/58 103/62 115/62  Pulse: 66 80 63 60  Resp: (!) 27 17 (!) 22 20  Temp:      TempSrc:      SpO2: 100% 100% 100% 99%  Weight:      Height:        Gen: Awake, alert, chronically ill appearing.   CV: Regular, normal S1, S2, 1/6 SEM  Resp: Normal WOB,  Coarse in bases, otherwise clear.  Abd: Flat, normoactive, nontender MSK: Symmetric, no edema  Skin: No rashes or lesions to exposed skin  Neuro: Alert and interactive, oriented, speech very slight dysarthria without aphasia. CN II / III with ? Relative APD on the L. OS minimally reactive 5 mm, OS 3->2. Anisocoria OS > OD, VF with OD nasal superior defect. Otherwise VF intact. CN VII, mild L lower facial droop. Otherwise IV; VI-XII intact. Motor 5/5 in the upper extremities. 4/5 bilaterally in hip flexor, otherwise 5/5 in the lower extremities, sensation intact and equal, and no clonus at the ankles.  Psych: euthymic, appropriate    Data review:   Labs reviewed, notable for:   BG 280  Hb 8.1 stable   Micro:  Results for orders placed or performed during the hospital encounter of 05/21/24  MRSA Next Gen by PCR, Nasal     Status: None   Collection Time: 05/21/24 11:45 PM   Specimen: Nasal Mucosa; Nasal Swab  Result Value Ref Range Status   MRSA by PCR Next Gen NOT DETECTED NOT DETECTED Final    Comment: (NOTE) The GeneXpert MRSA Assay (FDA approved for NASAL specimens only), is one component of a comprehensive MRSA colonization surveillance program. It is not intended to diagnose MRSA infection nor to guide or monitor treatment for MRSA infections. Test performance is not FDA approved in patients less than 50 years old. Performed at [facility], [address] [place], [place] [address]   Urine Culture     Status: Abnormal   Collection Time: 05/22/24  1:29 AM   Specimen: Urine, Random  Result Value Ref Range Status   Specimen Description   Final    URINE, RANDOM Performed at [facility], [address], [place], [place] [address]    Special Requests   Final    NONE Reflexed from [id number] Performed at [facility], [address], [place], [place] [address]     Culture (A)  Final    20,000 COLONIES/mL ENTEROCOCCUS FAECIUM WITHIN MIXED ORGANISMS Performed at [facility], [address] [place], [place] [address]    Report Status 05/24/2024 FINAL  Final   Organism ID, Bacteria ENTEROCOCCUS FAECIUM (A)  Final      Susceptibility   Enterococcus faecium - MIC*    AMPICILLIN <=2 SENSITIVE Sensitive     NITROFURANTOIN 64 INTERMEDIATE Intermediate     VANCOMYCIN  <=0.5 SENSITIVE Sensitive     * 20,000 COLONIES/mL ENTEROCOCCUS FAECIUM    Imaging reviewed:  CT Head Wo Contrast Result Date: 05/31/2024 CLINICAL DATA:  Confusion, weakness, mental status changes EXAM: CT HEAD WITHOUT CONTRAST TECHNIQUE: Contiguous axial images were obtained from the base of the skull through the vertex without intravenous contrast. RADIATION DOSE REDUCTION: This exam was performed according to the departmental dose-optimization program which includes automated exposure control, adjustment of the mA and/or kV according to patient size and/or use of iterative reconstruction technique. COMPARISON:  04/20/2024 FINDINGS: Brain: Area of low-density noted in the right posterior temporal and occipital lobes adjacent to the right lateral ventricle, new since prior study concerning for acute to subacute infarcts. No hemorrhage or hydrocephalus. No mass effect or midline shift. Vascular: No hyperdense vessel or unexpected calcification. Skull: No acute calvarial abnormality. Sinuses/Orbits: No acute findings Other: None IMPRESSION: New low-density areas in the posterior right temporal lobe and occipital lobe concerning for acute to subacute infarcts. These are new since prior study. Electronically Signed  By: Franky Crease M.D.   On: 05/31/2024 01:20   DG Chest Port 1 View Result Date: 05/31/2024 CLINICAL DATA:  Altered mental status EXAM: PORTABLE CHEST 1 VIEW COMPARISON:  05/21/2024 FINDINGS: Stable chronic elevation of the right hemidiaphragm. Right Port-A-Cath remains in place,  unchanged. Cardiomediastinal contours are unchanged. No confluent airspace opacities, effusions or edema. No acute bony abnormality. IMPRESSION: No active disease. Electronically Signed   By: Franky Crease M.D.   On: 05/31/2024 00:20    EKG:  Personally reviewed, sinus rhythm, LAD, RBBB, prolonged QTc 506 not corrected for BBB, discordant ST T changes without overt ischemic change.  ED Course:   EDP spoke with Neurology Dr. Vanessa, recommends for admission to [facility], stroke workup including MRI brain w w/o contrast, holding wellbutrin  in case seizure events.   Assessment/Plan:  63 y.o. 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 Acute-subacute ischemic stroke v. Possible malignant lesion   Hypodense lesions R temporal / occipital lobe  ? Acute-subacute ischemic stroke v. Possible malignant lesion  Episode of dysarthria / expressive aphasia - improved  Recent R headache, N/V and nonformed / colorful hallucination in R VF  P/with episode of expressive aphasia / dysarthria, which has improved. More subacute HA, n/V and visual color hallucinations on the R x few weeks. LKWT 9/27 at 2130. Exam with slight dysarthria, no residual aphasia, + eye findings as noted above, and mild L lower facial droop, bilateral hip flexor weakness, CT Head demonstrates hypodense lesion R posterior temporal and occipital lobes concerning for acute/subacute infarct, although would consider malignant lesions with her history. not candidate for TNK as outside window.   - Briefly spoke with Neurology Dr. Vanessa, recommends for evaluation with MRI brain w w/o contrast, holding wellbutrin  in case seizure events.; transfer to [facility]  -- MRI brain with and without contrast  -- Routine EEG  -- Held on CTA H/N and echo pending result above  - Antiplatelet previously on Prasugrel ; temporary hold with antiPLT rec per neurology; and would also involve cardiology with hx of overlapping LAD stents,.   - Statin continue Atorvastatin  80 mg   - Permissive HTN <220/120 given acute event  - Check lipids and A1c - Serial neurochecks  - Telemonitoring - PT/OT/SLP - DVT prophylaxis per below - Swallow screen then [facility] diet  -- Holding bupropion  in case of seizures.  Chronic medical problems: Metastatic Endometrial carcinoma: Hx hysterectomy, BSO, XRT, relapsed disease with Bx proven met to RP mass, suspected RUL lung met, who is currently receving chemoimmunotherapy with Keytruda , Taxol , Carboplatin  (C1 on 8/29), follows with Dr. Lonn outpatient.  CAD with PCI: LAD/Dx PCI in '00 with STEMI in '21 s/p DES to mid LAD with overlapping stent; Was on Praugrel 5 mg; However now presenting with CNS lesion, see above re: antiPLT plan / statin.  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 CPAP at bedtime  HTN: Hold home losartan  for permissive hypertension DM type 2, uncontrolled: Recent A1c 9.3% in 8'25. With hyperglycemia, Home  regimen is Tresiba 24 units at bedtime, metformin.; modest reduction to 20 units Semglee  nightly with SSI for sensitive, HS correction.  HLD: See stroke above Depression: Continue home fluoxetine , hold bupropion  for now.  Chronic pain/malignancy related pain: Continue home oxycodone  10 mg every 6 hours as needed, Lyrica  50 mg twice daily ? Weight loss: had not been taking cyproheptadine , ok to hold.  Chronic anemia: Hb stable ~ 8, follow with heme /onc op   Body mass index is 34.27 kg/m.  Obesity  class I  DVT prophylaxis:  SCDs Code Status:  Full Code Diet:  Diet Orders (From admission, onward)    None      Family Communication:  Yes discussed with husband at bedside   Consults:  Neurology   Admission status:   Inpatient, Telemetry bed  Severity of Illness: The appropriate patient status for this patient is INPATIENT. Inpatient status is judged to be reasonable and necessary in order to provide the required intensity of service to ensure the patient's safety. The patient's presenting symptoms, physical exam findings, and initial radiographic and laboratory data in the context of their chronic comorbidities is felt to place them at high risk for further clinical deterioration. Furthermore, it is not anticipated that the patient will be medically stable for discharge from the hospital within 2 midnights of admission.   * I certify that at the point of admission it is my clinical judgment that the patient will require inpatient hospital care spanning beyond 2 midnights from the point of admission due to high intensity of service, high risk for further deterioration and high frequency of surveillance required.*   Dorn Dawson, MD [facility]  How to contact the [facility] Attending or Consulting provider 7A - 7P or covering provider during after hours 7P -7A, for this patient.  Check the care team in [facility] and look for a) attending/consulting [facility] provider listed and b) the [facility] team listed Log into [email or web address] and use [place]'s universal password to access. If you do not have the password, please contact the hospital operator. Locate the [facility] provider you are looking for under [facility] and page to a number that you can be directly reached. If you still have difficulty reaching the provider, please page the [facility] (Director on Call) for the Hospitalists listed on amion for assistance.  05/31/2024, 2:14 AM

## 2024-05-31 NOTE — Progress Notes (Signed)
   05/31/24 2227  BiPAP/CPAP/SIPAP  $ Non-Invasive Home Ventilator  Subsequent  $ Face Mask Medium Yes  BiPAP/CPAP/SIPAP Pt Type Adult  BiPAP/CPAP/SIPAP Resmed  Mask Type Full face mask  Dentures removed? Not applicable  Mask Size Medium  Respiratory Rate 18 breaths/min  IPAP 14 cmH20  EPAP 5 cmH2O  Flow Rate 2 lpm  Patient Home Machine No  Patient Home Mask No  Patient Home Tubing No  Auto Titrate Yes  Minimum cmH2O 5 cmH2O  Maximum cmH2O 14 cmH2O  Device Plugged into RED Power Outlet Yes

## 2024-06-01 ENCOUNTER — Telehealth (HOSPITAL_COMMUNITY): Payer: Self-pay | Admitting: Pharmacy Technician

## 2024-06-01 ENCOUNTER — Other Ambulatory Visit: Payer: Self-pay | Admitting: Hematology and Oncology

## 2024-06-01 ENCOUNTER — Other Ambulatory Visit (HOSPITAL_COMMUNITY): Payer: Self-pay

## 2024-06-01 ENCOUNTER — Encounter (HOSPITAL_COMMUNITY)

## 2024-06-01 ENCOUNTER — Inpatient Hospital Stay (HOSPITAL_COMMUNITY)

## 2024-06-01 ENCOUNTER — Telehealth: Payer: Self-pay | Admitting: Oncology

## 2024-06-01 DIAGNOSIS — I6389 Other cerebral infarction: Secondary | ICD-10-CM

## 2024-06-01 DIAGNOSIS — Z7902 Long term (current) use of antithrombotics/antiplatelets: Secondary | ICD-10-CM

## 2024-06-01 DIAGNOSIS — R4182 Altered mental status, unspecified: Secondary | ICD-10-CM

## 2024-06-01 DIAGNOSIS — I6340 Cerebral infarction due to embolism of unspecified cerebral artery: Secondary | ICD-10-CM

## 2024-06-01 DIAGNOSIS — D6869 Other thrombophilia: Secondary | ICD-10-CM

## 2024-06-01 DIAGNOSIS — R569 Unspecified convulsions: Secondary | ICD-10-CM | POA: Diagnosis not present

## 2024-06-01 DIAGNOSIS — C541 Malignant neoplasm of endometrium: Secondary | ICD-10-CM

## 2024-06-01 LAB — CBC
HCT: 27.2 % — ABNORMAL LOW (ref 36.0–46.0)
Hemoglobin: 8.4 g/dL — ABNORMAL LOW (ref 12.0–15.0)
MCH: 27.8 pg (ref 26.0–34.0)
MCHC: 30.9 g/dL (ref 30.0–36.0)
MCV: 90.1 fL (ref 80.0–100.0)
Platelets: 217 K/uL (ref 150–400)
RBC: 3.02 MIL/uL — ABNORMAL LOW (ref 3.87–5.11)
RDW: 19.5 % — ABNORMAL HIGH (ref 11.5–15.5)
WBC: 6.2 K/uL (ref 4.0–10.5)
nRBC: 0 % (ref 0.0–0.2)

## 2024-06-01 LAB — BASIC METABOLIC PANEL WITH GFR
Anion gap: 6 (ref 5–15)
BUN: 7 mg/dL — ABNORMAL LOW (ref 8–23)
CO2: 26 mmol/L (ref 22–32)
Calcium: 8.5 mg/dL — ABNORMAL LOW (ref 8.9–10.3)
Chloride: 101 mmol/L (ref 98–111)
Creatinine, Ser: 0.68 mg/dL (ref 0.44–1.00)
GFR, Estimated: >60 mL/min (ref >60)
Glucose, Bld: 167 mg/dL — ABNORMAL HIGH (ref 70–99)
Potassium: 4.3 mmol/L (ref 3.5–5.1)
Sodium: 133 mmol/L — ABNORMAL LOW (ref 135–145)

## 2024-06-01 LAB — ECHOCARDIOGRAM COMPLETE
AR max vel: 1.36 cm2
AV Area VTI: 1.47 cm2
AV Area mean vel: 1.36 cm2
AV Mean grad: 15 mmHg
AV Peak grad: 29.4 mmHg
Ao pk vel: 2.71 m/s
Area-P 1/2: 2.14 cm2
Height: 62 in
S' Lateral: 3.6 cm
Weight: 2998.26 [oz_av]

## 2024-06-01 LAB — GLUCOSE, CAPILLARY
Glucose-Capillary: 167 mg/dL — ABNORMAL HIGH (ref 70–99)
Glucose-Capillary: 238 mg/dL — ABNORMAL HIGH (ref 70–99)
Glucose-Capillary: 239 mg/dL — ABNORMAL HIGH (ref 70–99)
Glucose-Capillary: 243 mg/dL — ABNORMAL HIGH (ref 70–99)

## 2024-06-01 LAB — HEMOGLOBIN A1C
Hgb A1c MFr Bld: 7.2 % — ABNORMAL HIGH (ref 4.8–5.6)
Mean Plasma Glucose: 159.94 mg/dL

## 2024-06-01 MED ORDER — METFORMIN HCL ER 500 MG PO TB24
1000.0000 mg | ORAL_TABLET | Freq: Two times a day (BID) | ORAL | Status: DC
  Administered 2024-06-01 – 2024-06-03 (×4): 1000 mg via ORAL
  Filled 2024-06-01 (×5): qty 2

## 2024-06-01 MED ORDER — APIXABAN 5 MG PO TABS
5.0000 mg | ORAL_TABLET | Freq: Two times a day (BID) | ORAL | Status: DC
Start: 2024-06-01 — End: 2024-06-03
  Administered 2024-06-01 – 2024-06-03 (×4): 5 mg via ORAL
  Filled 2024-06-01 (×4): qty 1

## 2024-06-01 NOTE — Plan of Care (Signed)

## 2024-06-01 NOTE — Procedures (Signed)
 Routine EEG Report  Jonnette Nuon is a 63 y.o. female with a history of altered mental status who is undergoing an EEG to evaluate for seizures.  Report: This EEG was acquired with electrodes placed according to the International 10-20 electrode system (including Fp1, Fp2, F3, F4, C3, C4, P3, P4, O1, O2, T3, T4, T5, T6, A1, A2, Fz, Cz, Pz). The following electrodes were missing or displaced: none.  The occipital dominant rhythm was 6-7 Hz. This activity is reactive to stimulation. Drowsiness was manifested by background fragmentation; deeper stages of sleep were not identified. There was no focal slowing. There were no interictal epileptiform discharges. There were no electrographic seizures identified. Photic stimulation and hyperventilation were not performed.  Impression and clinical correlation: This EEG was obtained while awake and drowsy and is abnormal due to mild diffuse slowing indicative of global cerebral dysfunction. Epileptiform abnormalities were not seen during this recording.  [name], MD [facility] [phone]  If 7pm- 7am, please page neurology on call as listed in AMION.

## 2024-06-01 NOTE — Discharge Instructions (Signed)
 Information on my medicine - ELIQUIS (apixaban)  Why was Eliquis prescribed for you? Eliquis was prescribed for you to reduce the risk of forming blood clots that can cause a stroke if you have a medical condition called atrial fibrillation (a type of irregular heartbeat) OR to reduce the risk of a blood clots forming after orthopedic surgery.  What do You need to know about Eliquis ? Take your Eliquis TWICE DAILY - one tablet in the morning and one tablet in the evening with or without food.  It would be best to take the doses about the same time each day.  If you have difficulty swallowing the tablet whole please discuss with your pharmacist how to take the medication safely.  Take Eliquis exactly as prescribed by your doctor and DO NOT stop taking Eliquis without talking to the doctor who prescribed the medication.  Stopping may increase your risk of developing a new clot or stroke.  Refill your prescription before you run out.  After discharge, you should have regular check-up appointments with your healthcare provider that is prescribing your Eliquis.  In the future your dose may need to be changed if your kidney function or weight changes by a significant amount or as you get older.  What do you do if you miss a dose? If you miss a dose, take it as soon as you remember on the same day and resume taking twice daily.  Do not take more than one dose of ELIQUIS at the same time.  Important Safety Information A possible side effect of Eliquis is bleeding. You should call your healthcare provider right away if you experience any of the following: Bleeding from an injury or your nose that does not stop. Unusual colored urine (red or dark brown) or unusual colored stools (red or black). Unusual bruising for unknown reasons. A serious fall or if you hit your head (even if there is no bleeding).  Some medicines may interact with Eliquis and might increase your risk of bleeding or  clotting while on Eliquis. To help avoid this, consult your healthcare provider or pharmacist prior to using any new prescription or non-prescription medications, including herbals, vitamins, non-steroidal anti-inflammatory drugs (NSAIDs) and supplements.  This website has more information on Eliquis (apixaban): http://www.eliquis.com/eliquis/home

## 2024-06-01 NOTE — Evaluation (Signed)
 Physical Therapy Evaluation Patient Details Name: Brenda [name] MRN: [id number] DOB: [birth year] Today's Date: 06/01/2024  History of Present Illness  63 y.o. female presents to [facility] on 05/29/24 for AMS. EEG r/o seizures negative for acute processes. 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 PMH includes HTN, lipidemia, DM, CAD, CHF, anxiety, depression, OSA, chronic respiratory failure, metastatic endometrial carcinoma.  Clinical Impression  Pt admitted with above. Pt pleasant and agreeable to therapy evaluation. Pt received sitting up in chair. Experienced lateral loss of balance when donning slippers standing with no UE support; requires assist by therapist to correct. Opted to utilize Rollator for dynamic balance and energy conservation. Pt ambulating limited hallway distance ~60 ft with CGA, experiences right drift, and requires mod-max cueing to locate objects/rooms on left side of hallway. Pt presents with impaired static/dynamic balance, functional weakness, visual deficit, decreased activity tolerance and impaired cognition. Presents as a high fall risk based on decreased gait speed and history of falls. Patient will benefit from intensive inpatient follow-up therapy, >3 hours/day.         If plan is discharge home, recommend the following: A little help with walking and/or transfers;A little help with bathing/dressing/bathroom;Assistance with cooking/housework;Assist for transportation;Help with stairs or ramp for entrance   Can travel by private vehicle   Yes    Equipment Recommendations None recommended by PT  Recommendations for Other Services  Rehab consult    Functional Status Assessment Patient has had a recent decline in their functional status and demonstrates the ability to make significant improvements in function in a reasonable and predictable amount of time.     Precautions / Restrictions Precautions Precautions:  Fall Restrictions Weight Bearing Restrictions Per Provider Order: No      Mobility  Bed Mobility               General bed mobility comments: OOB in chair with OT upon entry    Transfers Overall transfer level: Needs assistance Equipment used: Rollator (4 wheels) Transfers: Sit to/from Stand Sit to Stand: Contact guard assist                Ambulation/Gait Ambulation/Gait assistance: Min assist, Contact guard assist Gait Distance (Feet): 60 Feet Assistive device: Rollator (4 wheels) Gait Pattern/deviations: Step-through pattern, Decreased stride length, Drifts right/left Gait velocity: reduced Gait velocity interpretation: <1.8 ft/sec, indicate of risk for recurrent falls   General Gait Details: Pt initially minA with lateral LOB with no AD, progressing to CGA with Rollator, requires seated rest break in hall due to feeling like legs were giving out.  Stairs            Wheelchair Mobility     Tilt Bed    Modified Rankin (Stroke Patients Only) Modified Rankin (Stroke Patients Only) Pre-Morbid Rankin Score: No significant disability Modified Rankin: Moderately severe disability     Balance Overall balance assessment: Needs assistance Sitting-balance support: Feet supported Sitting balance-Leahy Scale: Good     Standing balance support: Bilateral upper extremity supported, During functional activity Standing balance-Leahy Scale: Poor                               Pertinent Vitals/Pain Pain Assessment Pain Assessment: Faces Faces Pain Scale: No hurt    Home Living Family/patient expects to be discharged to:: Private residence Living Arrangements: Children;Spouse/significant other Available Help at Discharge: Family;Available PRN/intermittently Type of Home: House Home Access: Ramped entrance  Home Layout: One level Home Equipment: Rollator (4 wheels);BSC/3in1;Shower seat;Grab bars - toilet;Grab bars - tub/shower  (transport chair)      Prior Function Prior Level of Function : History of Falls (last six months);Independent/Modified Independent;Driving             Mobility Comments: Recent falls, has used Rollator in past ADLs Comments: Patient has not needed any assist for ADL/IADL.  Shares chores with nephew. On disability.     Extremity/Trunk Assessment   Upper Extremity Assessment Upper Extremity Assessment: Defer to OT evaluation    Lower Extremity Assessment Lower Extremity Assessment: RLE deficits/detail;LLE deficits/detail RLE Deficits / Details: Strength 5/5 LLE Deficits / Details: Strength 5/5    Cervical / Trunk Assessment Cervical / Trunk Assessment: Normal  Communication   Communication Communication: No apparent difficulties    Cognition Arousal: Alert Behavior During Therapy: Flat affect   PT - Cognitive impairments: Awareness, Attention, Sequencing, Problem solving, Safety/Judgement                       PT - Cognition Comments: Pt not oriented to day of week, difficulty following 2 step commands and with sequencing Following commands: Impaired Following commands impaired: Follows multi-step commands inconsistently     Cueing Cueing Techniques: Verbal cues     General Comments      Exercises     Assessment/Plan    PT Assessment Patient needs continued PT services  PT Problem List Decreased strength;Decreased activity tolerance;Decreased balance;Decreased mobility;Decreased knowledge of use of DME;Decreased knowledge of precautions       PT Treatment Interventions DME instruction;Gait training;Functional mobility training;Therapeutic activities;Therapeutic exercise;Balance training;Neuromuscular re-education;Cognitive remediation;Patient/family education    PT Goals (Current goals can be found in the Care Plan section)  Acute Rehab PT Goals Patient Stated Goal: less falling PT Goal Formulation: With patient/family Time For Goal  Achievement: 06/15/24 Potential to Achieve Goals: Good    Frequency Min 3X/week     Co-evaluation               AM-PAC PT 6 Clicks Mobility  Outcome Measure Help needed turning from your back to your side while in a flat bed without using bedrails?: A Little Help needed moving from lying on your back to sitting on the side of a flat bed without using bedrails?: A Little Help needed moving to and from a bed to a chair (including a wheelchair)?: A Little Help needed standing up from a chair using your arms (e.g., wheelchair or bedside chair)?: A Little Help needed to walk in hospital room?: A Little Help needed climbing 3-5 steps with a railing? : A Lot 6 Click Score: 17    End of Session Equipment Utilized During Treatment: Gait belt;Oxygen  Activity Tolerance: Patient tolerated treatment well Patient left: in chair;with call bell/phone within reach;with family/visitor present Nurse Communication: Mobility status PT Visit Diagnosis: Other abnormalities of gait and mobility (R26.89);Muscle weakness (generalized) (M62.81);Other symptoms and signs involving the nervous system (R29.898);Unsteadiness on feet (R26.81);History of falling (Z91.81)    Time: 1047-1110 PT Time Calculation (min) (ACUTE ONLY): 23 min   Charges:   PT Evaluation $PT Eval Low Complexity: 1 Low   PT General Charges $$ ACUTE PT VISIT: 1 Visit         Aleck Daring, PT, DPT Acute Rehabilitation Services Office [phone]   Aleck ONEIDA Daring 06/01/2024, 11:24 AM

## 2024-06-01 NOTE — Evaluation (Signed)
 Occupational Therapy Evaluation Patient Details Name: Brenda [name] MRN: [id number] DOB: [birth year] Today's Date: 06/01/2024   History of Present Illness   63 y.o. female presents to [facility] hospital on 05/29/24 for AMS. EEG r/o seizures negative for acute processes. 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 PMH includes HTN, lipidemia, DM, CAD, CHF, anxiety, depression, OSA, chronic respiratory failure, metastatic endometrial carcinoma.     Clinical Impressions Pt admitted for the above details. Pt greeted in supine, agreeable for OT visit. AOX3, re-orientation provided. Supportive family at bedside. PTA, pt was independent with ADLs, mobility without AD, and was managing her medications. Family used transport chair for community mobility. Today, she presents with intact strength/sensation in B UE. Suspected L inattention and visual field cut on L side. Pt with poor organizational and sequencing skills as observed during functional tasks. Functionally, she required up to mod A for UB grooming, min A for toileting, and min A for functional ambulation without AD and few LOB noted. Left upright in chair with PT in room for handoff.   Pt is currently functioning below baseline and would benefit from ongoing acute OT services to progress towards safe discharge and to facilitate return to prior level of function. Current recommendation is for high-intensity post-acute rehab (> 3 hours/day).     If plan is discharge home, recommend the following:   A little help with walking and/or transfers;A lot of help with bathing/dressing/bathroom;Assistance with cooking/housework;Direct supervision/assist for medications management;Direct supervision/assist for financial management;Assist for transportation;Supervision due to cognitive status     Functional Status Assessment   Patient has had a recent decline in their functional status and demonstrates the ability to make significant  improvements in function in a reasonable and predictable amount of time.     Equipment Recommendations   Other (comment) (defer to next level of care)     Recommendations for Other Services   Rehab consult     Precautions/Restrictions   Precautions Precautions: Fall Recall of Precautions/Restrictions: Impaired Restrictions Weight Bearing Restrictions Per Provider Order: No     Mobility Bed Mobility Overal bed mobility: Needs Assistance Bed Mobility: Supine to Sit     Supine to sit: Supervision, HOB elevated, Used rails     General bed mobility comments: Exited to the R side    Transfers Overall transfer level: Needs assistance Equipment used: None Transfers: Sit to/from Stand, Bed to chair/wheelchair/BSC Sit to Stand: Contact guard assist     Step pivot transfers: Contact guard assist     General transfer comment: Stood from bed height & lower toilet height with cues for hand placement. Cued to use grab bars to assist in bathroom.      Balance Overall balance assessment: Needs assistance Sitting-balance support: No upper extremity supported, Feet supported Sitting balance-Leahy Scale: Good Sitting balance - Comments: no LOB seated EOB   Standing balance support: No upper extremity supported Standing balance-Leahy Scale: Poor Standing balance comment: pt often with truncal sway and nBOS during functional ambulation in room, often reaching for walls/doorframes for support                           ADL either performed or assessed with clinical judgement   ADL Overall ADL's : Needs assistance/impaired     Grooming: Moderate assistance;Cueing for sequencing;Standing;Wash/dry hands;Oral care Grooming Details (indicate cue type and reason): cues for identifying grooming supplies & appropriate items on wall (light switch, paper towel & soap dispensers), pt  attempting to turn faucet on by moving lightswitch and touching open outlet              Lower Body Dressing: Moderate assistance;Cueing for sequencing;Sitting/lateral leans Lower Body Dressing Details (indicate cue type and reason): adjusting B socks Toilet Transfer: Minimal assistance;Ambulation;Regular Teacher, adult education Details (indicate cue type and reason): cues to safely approach toilet         Functional mobility during ADLs: Minimal assistance;Cueing for safety;Cueing for sequencing       Vision Baseline Vision/History: 5 Retinopathy (diabetic) Ability to See in Adequate Light: 0 Adequate Patient Visual Report: Blurring of vision Vision Assessment?: Yes Eye Alignment: Within Functional Limits Ocular Range of Motion: Within Functional Limits Alignment/Gaze Preference: Chin down Tracking/Visual Pursuits: Able to track stimulus in all quads without difficulty Saccades: Impaired - to be further tested in functional context Visual Fields: Left visual field deficit Additional Comments: pt with L visual field cut, not detecting stim until near midline     Perception Perception: Impaired Preception Impairment Details: Inattention/Neglect Perception-Other Comments: L visual inattention suspected   Praxis Praxis: Impaired Praxis Impairment Details: Organization     Pertinent Vitals/Pain Pain Assessment Pain Assessment: No/denies pain Faces Pain Scale: No hurt     Extremity/Trunk Assessment Upper Extremity Assessment Upper Extremity Assessment: Generalized weakness (MMT not performed 2/2 metastatic uterine CA)   Lower Extremity Assessment Lower Extremity Assessment: Defer to PT evaluation RLE Deficits / Details: Strength 5/5 LLE Deficits / Details: Strength 5/5   Cervical / Trunk Assessment Cervical / Trunk Assessment: Normal   Communication Communication Communication: Impaired Factors Affecting Communication: Difficulty expressing self (intermittent)   Cognition Arousal: Alert Behavior During Therapy: Flat affect Cognition: Cognition  impaired   Orientation impairments: Time, Situation Awareness: Intellectual awareness impaired Memory impairment (select all impairments): Short-term memory, Working memory Attention impairment (select first level of impairment): Sustained attention Executive functioning impairment (select all impairments): Initiation, Organization, Sequencing, Problem solving, Reasoning OT - Cognition Comments: very participatory; difficulty executing commands, requiring multimodal cueing for sequencing and task completion                 Following commands: Impaired Following commands impaired: Follows multi-step commands inconsistently     Cueing  General Comments   Cueing Techniques: Verbal cues;Tactile cues;Visual cues      Exercises     Shoulder Instructions      Home Living Family/patient expects to be discharged to:: Private residence Living Arrangements: Children;Spouse/significant other Available Help at Discharge: Family;Available PRN/intermittently Type of Home: House Home Access: Ramped entrance     Home Layout: One level     Bathroom Shower/Tub: Chief Strategy Officer: Standard Bathroom Accessibility: Yes   Home Equipment: Rollator (4 wheels);BSC/3in1;Shower seat;Grab bars - toilet;Grab bars - tub/shower (transport chair)   Additional Comments: Pt has intermittently used rollator but does not want to use one; family uses transport w/c for community distances  Lives With: Spouse    Prior Functioning/Environment Prior Level of Function : History of Falls (last six months);Independent/Modified Independent;Driving             Mobility Comments: Recent falls, has used Rollator in past ADLs Comments: Pt has driven short distances a few times since early August. Manages her medications via pillbox.    OT Problem List: Decreased activity tolerance;Obesity;Decreased strength;Impaired balance (sitting and/or standing);Decreased coordination;Decreased  cognition;Decreased safety awareness;Decreased knowledge of use of DME or AE;Decreased knowledge of precautions;Impaired vision/perception   OT Treatment/Interventions: Self-care/ADL training;Therapeutic exercise;DME and/or AE instruction;Therapeutic  activities;Patient/family education;Balance training;Cognitive remediation/compensation;Energy conservation;Visual/perceptual remediation/compensation      OT Goals(Current goals can be found in the care plan section)   Acute Rehab OT Goals Patient Stated Goal: pt agreeable to con't acute OT services OT Goal Formulation: With patient/family Time For Goal Achievement: 06/15/24 Potential to Achieve Goals: Good   OT Frequency:  Min 2X/week    Co-evaluation              AM-PAC OT 6 Clicks Daily Activity     Outcome Measure Help from another person eating meals?: A Little Help from another person taking care of personal grooming?: A Lot Help from another person toileting, which includes using toliet, bedpan, or urinal?: A Lot Help from another person bathing (including washing, rinsing, drying)?: A Lot Help from another person to put on and taking off regular upper body clothing?: A Little Help from another person to put on and taking off regular lower body clothing?: A Lot 6 Click Score: 14   End of Session Equipment Utilized During Treatment: Gait belt Nurse Communication: Mobility status  Activity Tolerance: Patient tolerated treatment well Patient left: in chair;with call bell/phone within reach;with chair alarm set;with family/visitor present;Other (comment) (PT in room)  OT Visit Diagnosis: Other abnormalities of gait and mobility (R26.89);Other symptoms and signs involving cognitive function;Cognitive communication deficit (R41.841) Symptoms and signs involving cognitive functions: Cerebral infarction                Time: 8995-8954 OT Time Calculation (min): 41 min Charges:  OT General Charges $OT Visit: 1 Visit OT  Evaluation $OT Eval Moderate Complexity: 1 Mod OT Treatments $Self Care/Home Management : 8-22 mins  Jullie Arps D., MSOT, OTR/L Acute Rehabilitation Services [phone] Secure Chat Preferred  Rikki Milch 06/01/2024, 1:53 PM

## 2024-06-01 NOTE — Progress Notes (Signed)

## 2024-06-01 NOTE — Telephone Encounter (Signed)
 Called Brenda [name] and let her know that chemotherapy is canceled tomorrow due to being in the hospital.  Advised her that it will be rescheduled once she is out of the hospital for at least a week per Dr. Lonn.

## 2024-06-01 NOTE — Progress Notes (Signed)
 STROKE TEAM PROGRESS NOTE   SUBJECTIVE (INTERVAL HISTORY) Her daughter is at the bedside.  Overall her condition is stable. She is sitting in chair, awake alert, neuro stable. Discussed with cardiology and OK with eliquis alone.    OBJECTIVE Temp:  [97.5 F (36.4 C)-98 F (36.7 C)] 97.7 F (36.5 C) (09/29 1154) Pulse Rate:  [61-68] 64 (09/29 1154) Cardiac Rhythm: Sinus bradycardia (09/29 0800) Resp:  [15-19] 18 (09/29 1154) BP: (98-150)/(40-63) 102/43 (09/29 1154) SpO2:  [97 %-100 %] 100 % (09/29 1154)  Recent Labs  Lab 05/31/24 1250 05/31/24 1727 05/31/24 2101 06/01/24 0635 06/01/24 1154  GLUCAP 238* 174* 197* 167* 238*   Recent Labs  Lab 05/28/24 1427 05/30/24 2350 05/31/24 0011 06/01/24 0518  NA 138 133* 134* 133*  K 3.8 4.1 4.0 4.3  CL 102 98 97* 101  CO2 31 23  --  26  GLUCOSE 153* 280* 287* 167*  BUN 8 8 7* 7*  CREATININE 0.53 0.77 0.80 0.68  CALCIUM  8.6* 8.8*  --  8.5*   Recent Labs  Lab 05/28/24 1427 05/30/24 2350  AST 11* 18  ALT 6 7  ALKPHOS 90 90  BILITOT 0.3 0.3  PROT 6.5 5.8*  ALBUMIN 3.3* 3.1*   Recent Labs  Lab 05/28/24 1427 05/30/24 2350 05/31/24 0011 06/01/24 0518  WBC 8.8 7.1  --  6.2  NEUTROABS 6.8 5.3  --   --   HGB 8.8* 8.1* 8.5* 8.4*  HCT 27.8* 26.9* 25.0* 27.2*  MCV 86.3 90.6  --  90.1  PLT 215 193  --  217   No results for input(s): CKTOTAL, CKMB, CKMBINDEX, TROPONINI in the last 168 hours. No results for input(s): LABPROT, INR in the last 72 hours. Recent Labs    05/31/24 0630  COLORURINE YELLOW  LABSPEC 1.016  PHURINE 5.0  GLUCOSEU 150*  HGBUR NEGATIVE  BILIRUBINUR NEGATIVE  KETONESUR NEGATIVE  PROTEINUR NEGATIVE  NITRITE NEGATIVE  LEUKOCYTESUR NEGATIVE       Component Value Date/Time   CHOL 100 05/30/2024 2350   CHOL 108 01/25/2023 0945   TRIG 170 (H) 05/30/2024 2350   HDL 38 (L) 05/30/2024 2350   HDL 45 01/25/2023 0945   CHOLHDL 2.6 05/30/2024 2350   VLDL 34 05/30/2024 2350   LDLCALC 28  05/30/2024 2350   LDLCALC 40 01/25/2023 0945   Lab Results  Component Value Date   HGBA1C 7.2 (H) 06/01/2024      Component Value Date/Time   LABOPIA POSITIVE (A) 04/20/2024 1817   COCAINSCRNUR NONE DETECTED 04/20/2024 1817   LABBENZ NONE DETECTED 04/20/2024 1817   AMPHETMU NONE DETECTED 04/20/2024 1817   THCU NONE DETECTED 04/20/2024 1817   LABBARB NONE DETECTED 04/20/2024 1817    No results for input(s): ETH in the last 168 hours.  I have personally reviewed the radiological images below and agree with the radiology interpretations.  EEG adult Result Date: 06/01/2024 Matthews Elida HERO, MD     06/01/2024  7:53 AM Routine EEG Report Agusta Hackenberg is a 63 y.o. female with a history of altered mental status who is undergoing an EEG to evaluate for seizures. Report: This EEG was acquired with electrodes placed according to the International 10-20 electrode system (including Fp1, Fp2, F3, F4, C3, C4, P3, P4, O1, O2, T3, T4, T5, T6, A1, A2, Fz, Cz, Pz). The following electrodes were missing or displaced: none. The occipital dominant rhythm was 6-7 Hz. This activity is reactive to stimulation. Drowsiness was manifested by background fragmentation;  deeper stages of sleep were not identified. There was no focal slowing. There were no interictal epileptiform discharges. There were no electrographic seizures identified. Photic stimulation and hyperventilation were not performed. Impression and clinical correlation: This EEG was obtained while awake and drowsy and is abnormal due to mild diffuse slowing indicative of global cerebral dysfunction. Epileptiform abnormalities were not seen during this recording. [name], MD [facility] [phone] If 7pm- 7am, please page neurology on call as listed in AMION.   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 Confusion, weakness, neurologic deficit. EXAM: MRI  HEAD WITHOUT AND WITH CONTRAST TECHNIQUE: Multiplanar, multiecho pulse sequences of the brain and surrounding structures were obtained without and with intravenous contrast. CONTRAST:  8mL GADAVIST  GADOBUTROL  1 MMOL/ML IV SOLN COMPARISON:  Head CT without contrast 0101 hours today. Brain MRI 04/21/2024. FINDINGS: Brain: Confluent restricted diffusion in the right PCA territory from the posterior mesial temporal lobe, patchy right occipital lobe involvement, periatrial white matter involvement and marginal involvement of the splenium of the corpus callosum. Additional numerous small foci of restricted diffusion scattered in the bilateral cerebellum and cerebral hemispheres. Anterior and posterior circulation affected bilaterally. Deep gray nuclei and brainstem are relatively spared. Cytotoxic edema in the affected areas. No intracranial hemorrhage identified. Luxury perfusion enhancement appearance in the right PCA territory (series 17, image 13). No masslike or suspicious postcontrast enhancement. No dural thickening. No midline shift, ventriculomegaly, evidence of discrete intracranial mass. Cervicomedullary junction and pituitary are within normal limits. No abnormal enhancement identified. Evidence of mass lesion, ventriculomegaly, extra-axial collection or acute intracranial hemorrhage. Cervicomedullary junction and pituitary are within normal limits. Vascular: Major intracranial vascular flow voids are preserved. Skull and upper cervical spine: Visualized bone marrow signal is within normal limits. Negative visible cervical spine and spinal cord. Sinuses/Orbits: Negative. Other: Stable mild mastoid effusion since last month. IMPRESSION: 1. Numerous small acute embolic infarcts in the bilateral anterior and posterior circulation, with superimposed confluent Right PCA territory infarct corresponding to that seen on CT this morning. 2. Cytotoxic edema with no intracranial hemorrhage or mass effect. 3. No  intracranial metastatic disease identified. Electronically Signed   By: VEAR Hurst M.D.   On: 05/31/2024 10:49   CT Head Wo Contrast Result Date: 05/31/2024 CLINICAL DATA:  Confusion, weakness, mental status changes EXAM: CT HEAD WITHOUT CONTRAST TECHNIQUE: Contiguous axial images were obtained from the base of the skull through the vertex without intravenous contrast. RADIATION DOSE REDUCTION: This exam was performed according to the departmental dose-optimization program which includes automated exposure control, adjustment of the mA and/or kV according to patient size and/or use of iterative reconstruction technique. COMPARISON:  04/20/2024 FINDINGS: Brain: Area of low-density noted in the right posterior temporal and occipital lobes adjacent to the right lateral ventricle, new since prior study concerning for acute to subacute infarcts. No hemorrhage or hydrocephalus. No mass effect or midline shift. Vascular: No hyperdense vessel or unexpected calcification. Skull: No acute calvarial abnormality. Sinuses/Orbits: No acute findings Other: None IMPRESSION: New low-density areas in the posterior right temporal lobe and occipital lobe concerning for acute to subacute infarcts. These are new since prior study. Electronically Signed   By: Franky Crease M.D.   On: 05/31/2024 01:20   DG Chest Port 1 View Result Date: 05/31/2024 CLINICAL DATA:  Altered mental status EXAM: PORTABLE CHEST 1 VIEW COMPARISON:  05/21/2024 FINDINGS: Stable chronic elevation of the right hemidiaphragm. Right Port-A-Cath remains in place, unchanged. Cardiomediastinal  contours are unchanged. No confluent airspace opacities, effusions or edema. No acute bony abnormality. IMPRESSION: No active disease. Electronically Signed   By: Franky Crease M.D.   On: 05/31/2024 00:20   DG CHEST PORT 1 VIEW Result Date: 05/21/2024 CLINICAL DATA:  10031 Cough 10031 EXAM: PORTABLE CHEST 1 VIEW COMPARISON:  Chest x-ray 05/04/2024, CT abdomen pelvis 05/04/2024  FINDINGS: Right chest wall accessed Port-A-Cath with tip overlying the expected region of the superior collection. The heart and mediastinal contours are unchanged. Persistent elevation of the right hemidiaphragm. No focal consolidation. No pulmonary edema. No pleural effusion. No pneumothorax. No acute osseous abnormality. IMPRESSION: No active disease. Electronically Signed   By: Morgane  Naveau M.D.   On: 05/21/2024 23:50   CT ABDOMEN PELVIS W CONTRAST Result Date: 05/04/2024 CLINICAL DATA:  63 year old female with abdominal pain. Metastatic endometrial cancer. * Tracking Code: BO * EXAM: CT ABDOMEN AND PELVIS WITH CONTRAST TECHNIQUE: Multidetector CT imaging of the abdomen and pelvis was performed using the standard protocol following bolus administration of intravenous contrast. RADIATION DOSE REDUCTION: This exam was performed according to the departmental dose-optimization program which includes automated exposure control, adjustment of the mA and/or kV according to patient size and/or use of iterative reconstruction technique. CONTRAST:  OMNIPAQUE  IOHEXOL  300 MG/ML  SOLN COMPARISON:  CT Abdomen and Pelvis 03/29/2024. FINDINGS: Lower chest: Stable elevation of the right hemidiaphragm. Borderline to mild cardiomegaly is stable. No pericardial effusion. Trace layering left pleural effusion is new since July. And there is confluent new posterior basal segment left lower lobe peribronchial airspace opacity with air bronchograms (series 6, image 51). The visible airways remain patent. No suspicious lung base nodule or discrete mass. Hepatobiliary: Chronically absent gallbladder and stable prominence of intra- and extrahepatic biliary ducts since July. No discrete liver mass. Pancreas: Stable atrophy. Spleen: Stable and negative. Adrenals/Urinary Tract: Mass effect on the left kidney from bulky, which left abdominal retroperitoneal, paraspinal, pararenal space mass is further detailed below. Invasion of the  left pararenal space as before. Involvement of the left main renal vessels also (series 2, image 38). But renal enhancement remain symmetric. Renal contrast excretion is symmetric. The left ureter is located along the anterior margin of the large tumor and is nondilated. Adrenal glands remain within normal limits. Bladder is negative. Stomach/Bowel: Nondilated large and small bowel. Redundant sigmoid colon. Mild large bowel retained stool. Cecum partially located in the pelvis. Decompressed terminal ileum. Normal appendix on series 2, image 73. Small fat containing ventral abdominal hernia near the midline on series 2, image 67 is stable. No herniated bowel. Stomach and duodenum appear negative. No pneumoperitoneum. No free fluid. Vascular/Lymphatic: Large and heterogeneously enhancing nearly 10 cm left abdominal retroperitoneal, paraspinal, pararenal space mass. This may be severe ex nodal tumor, and has progressed since July which is further detailed below. That tumor is inseparable from the infrarenal abdominal aorta which remains patent on series 2, image 44. Tumor extends to the aortoiliac bifurcation which is patent. Underlying mild for age Aortoiliac calcified atherosclerosis. More advanced calcified femoral artery atherosclerosis. Major arterial branches remain patent. Portal venous system and IVC also appear patent. Left external iliac lymphadenopathy is stable measuring up to 18 mm short axis. No other lymphadenopathy. Reproductive: Surgically absent. Other: No pelvis free fluid. 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 But subtle if any interval erosion into the adjacent L2 and L3 vertebral bodies (perhaps on series 2, image 41). No discrete bone metastasis by CT. Body wall edema  not significantly changed since July. IMPRESSION: 1. Very large and progressed since July Left abdominal tumor occupying and invading the lumbar paraspinal, retroperitoneal, para-aortic, and pararenal spaces. Tumor is now up to 9.5 cm transaxial, directly involves the left renal vessels and abuts the aorta - which remain patent. Early if any invasion into the adjacent L2 and L3 lumbar vertebrae. 2. Confluent new Left lower lobe posterior basal segment peribronchial consolidation, more suspicious for Pneumonia or Aspiration than metastatic disease. Small new layering left pleural effusion. 3. Stable left external iliac lymphadenopathy. No brand new metastasis is identified in the abdomen or pelvis. Electronically Signed   By: VEAR Hurst M.D.   On: 05/04/2024 04:04   DG Chest Port 1 View Result Date: 05/04/2024 CLINICAL DATA:  Dyspnea EXAM: PORTABLE CHEST 1 VIEW COMPARISON:  04/20/2024 FINDINGS: Stable elevation of the right hemidiaphragm. Lungs are clear. No pneumothorax or pleural effusion. Right internal jugular chest port tip seen within the superior cavoatrial junction. Cardiac size within limits. Pulmonary vascularity is normal. No acute bone abnormality. IMPRESSION: 1. No active disease. Electronically Signed   By: Dorethia Molt M.D.   On: 05/04/2024 02:47     PHYSICAL EXAM  Temp:  [97.5 F (36.4 C)-98 F (36.7 C)] 97.7 F (36.5 C) (09/29 1154) Pulse Rate:  [61-68] 64 (09/29 1154) Resp:  [15-19] 18 (09/29 1154) BP: (98-150)/(40-63) 102/43 (09/29 1154) SpO2:  [97 %-100 %] 100 % (09/29 1154)  General - Well nourished, well developed, in no apparent distress.  Ophthalmologic - fundi not visualized due to noncooperation.  Cardiovascular - Regular rhythm and rate.  Neuro - awake, alert, eyes open, orientated to place, age, month and people but not year. No aphasia, but paucity of speech, following all simple commands with mild psychomotor slowing. Able to name 3/4 and repeat simple sentences. No  gaze palsy, tracking bilaterally, visual field full however patient has diabetic retinopathy with tunnel vision, PERRL. No significant facial droop. Tongue midline. Bilateral UEs 4/5, no drift. Bilaterally LEs 4/5, no drift. Sensation symmetrical bilaterally, b/l FTN intact, gait not tested.    ASSESSMENT/PLAN [name] is a 63 y.o. female with history of hypertension, hyperlipidemia, stage IV metastatic uterine cancer, diabetes, OSA on CPAP, chronic respiratory failure on home O2, and CAD status post stenting admitted for aphasia. Symptoms has much resolved but MRI showed emoblic infarcts. No TNK given due to outside window.    Stroke:  bilateral anterior and posterior small infarcts embolic likely secondary to hypercoagulable state from advanced malignancy  CT head concerning for acute to subacute infarct at right temporal lobe and occipital lobe.  CTA head and neck in 04/2024 unremarkable MRI showed numerous small acute embolic infarct in the bilateral anterior and posterior circulations, consistent with embolic source.  2D Echo  pending LE venous doppler pending LDL 40 in 04/2024 A1c 9.2 in 04/2024   Lovenox  for VTE prophylaxis Effient  prior to admission, now on aspirin  325 mg daily. Will switch from ASA to eliquis today.  Patient counseled to be compliant with her antithrombotic medications Ongoing aggressive stroke risk factor management Therapy recommendations:  CIR Disposition:  pending  Hx of recent stroke In 04/2024, patient was admitted for lethargy and confusion.  Found to be in DKA.  CT no acute finding.  CT head  and neck no LVO.  MRI no acute infarct.  EF 60 to 65%.  LDL 40, A1c 9.2.  Her symptoms are considered as encephalopathy in the setting of DKA. She was continued on Effient  and Lipitor  80.   CAD/NSTEMI NSTEMI in 2000 status post stenting, again had a stenting in 10/2019.   Allergic to Plavix, on Effient  5 daily and Lipitor  80 PTA.  Discussed with cardiology,  OK with eliquis alone now  Uterine cancer follows with Dr. Lonn for metastatic uterine cancer which diagnosed in 2023 status post surgery and radiation treatment.   Found to have recurrent cancer in 03/2024 with severe back pain and CT showed recurrent disease in the retroperitoneal lymph node.   CT of chest showed lung mass suspicious for metastatic lesion.   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  Concerning for hypercoagulable state now - will put on eliquis  Diabetes HgbA1c 9.2 in 04/2024 goal < 7.0 Uncontrolled Currently on lantus   CBG monitoring SSI DM education and close PCP follow up  Hypertension Stable Long term BP goal normotensive  Hyperlipidemia Home meds:  lipitor  80  LDL 40 in 04/2024, goal < 70 Now on lipitor  80 Continue statin at discharge  Other Stroke Risk Factors Obesity, Body mass index is 34.27 kg/m.  Obstructive sleep apnea, on CPAP at home  Other Active Problems chronic respiratory failure on home O2  Hospital day # 1    Ary Cummins, MD PhD Stroke Neurology 06/01/2024 12:34 PM    To contact Stroke Continuity provider, please refer to [email or web address]. After hours, contact General Neurology

## 2024-06-01 NOTE — Progress Notes (Signed)
   06/01/24 2337  BiPAP/CPAP/SIPAP  $ Non-Invasive Home Ventilator  Subsequent  BiPAP/CPAP/SIPAP Pt Type Adult  BiPAP/CPAP/SIPAP Resmed  Mask Type Full face mask  Dentures removed? Not applicable  Mask Size Medium  Respiratory Rate 18 breaths/min  IPAP 14 cmH20  EPAP 5 cmH2O  Flow Rate 2 lpm  Patient Home Machine No  Patient Home Mask No  Patient Home Tubing No  Auto Titrate Yes  Minimum cmH2O 5 cmH2O  Maximum cmH2O 14 cmH2O  Device Plugged into [employer] Power Outlet Yes

## 2024-06-01 NOTE — Evaluation (Signed)
 Speech Language Pathology Evaluation Patient Details Name: Brenda [name] MRN: [id number] DOB: [birth year] Today's Date: 06/01/2024 Time: 8865-8853 SLP Time Calculation (min) (ACUTE ONLY): 12 min  Problem List:  Patient Active Problem List   Diagnosis Date Noted   Acute ischemic stroke (HCC) 05/31/2024   Brain lesion 05/31/2024   DKA (diabetic ketoacidosis) (HCC) 05/21/2024   Physical debility 05/12/2024   Metastatic malignant neoplasm (HCC) 05/05/2024   Community acquired pneumonia 05/04/2024   History of CAD (coronary artery disease) 05/04/2024   CAP (community acquired pneumonia) 05/04/2024   Stroke-like symptoms 04/21/2024   Acute focal neurological deficit 04/20/2024   Cancer related pain 04/16/2024   Goals of care, counseling/discussion 04/16/2024   Other constipation 04/16/2024   Intractable low back pain 03/29/2024   Small bowel obstruction (HCC) 12/09/2021   SBO (small bowel obstruction) (HCC) 12/08/2021   Endometrial carcinoma (HCC) 12/06/2021   Nonspecific chest pain 10/19/2021   Dysfunctional uterine bleeding    Anemia    Chronic hypoxic respiratory failure (HCC) 03/22/2021   (HFpEF) heart failure with preserved ejection fraction (HCC) 02/09/2021   Chronic diastolic CHF (congestive heart failure) (HCC) 02/08/2021   Old MI (myocardial infarction) 10/26/2019   Anxiety with depression 03/28/2016   Type 2 diabetes mellitus with complication, with long-term current use of insulin  (HCC) 02/23/2015   Morbid obesity (HCC) 02/22/2015   Dyspnea 03/11/2013   Hyperlipidemia 05/25/2009   CAD (coronary artery disease) 05/25/2009   Essential hypertension 05/24/2009   Obstructive sleep apnea 05/24/2009   Past Medical History:  Past Medical History:  Diagnosis Date   Anemia    Arthritis    back, hands, hips (02/22/2015)   CAD (coronary artery disease)    a. complex LAD/diagonal bifurcation PCI in 2010. b. STEMI 10/2019 s/p PTCA/DES x1 to mLAD overlapping the old stent,  residual disease treated medicaly.   Cancer [facility])    uterine   CHF (congestive heart failure) (HCC)    Depression    Diabetes mellitus (HCC)    started when I was pregnant; not sure if it was type 1 or type 2    History of radiation therapy    Endometrium- HDR 01/22/22-03/07/22- Dr. Lynwood Nasuti   Hypercholesterolemia    Hypertension    Morbid obesity (HCC)    Myocardial infarction (HCC)    mild x 3   Neuromuscular disorder (HCC)    neuropathy feet   Sleep apnea    cpap/   Past Surgical History:  Past Surgical History:  Procedure Laterality Date   CARDIAC CATHETERIZATION  12/02/2012   CHOLECYSTECTOMY OPEN  09/04/1987   CORONARY ANGIOPLASTY WITH STENT PLACEMENT  09/03/2009   CORONARY/GRAFT ACUTE MI REVASCULARIZATION N/A 10/26/2019   Procedure: CORONARY/GRAFT ACUTE MI REVASCULARIZATION;  Surgeon: Verlin Lonni BIRCH, MD;  Location: [facility] IN[facility]AS[facility];  Service: Cardiovascular;  Laterality: N/A;   INCISIONAL HERNIA REPAIR N/A 12/09/2021   Procedure: LAPAROSCOPIC REPAIR UMBILICAL HERNIA WITH MESH;  Surgeon: Vernetta Berg, MD;  Location: [facility] ORS;  Service: General;  Laterality: N/A;   IR IMAGING GUIDED PORT INSERTION  04/08/2024   LEFT HEART CATH AND CORONARY ANGIOGRAPHY N/A 10/26/2019   Procedure: LEFT HEART CATH AND CORONARY ANGIOGRAPHY;  Surgeon: Verlin Lonni BIRCH, MD;  Location: [facility] CV [facility];  Service: Cardiovascular;  Laterality: N/A;   LEFT HEART CATHETERIZATION WITH CORONARY ANGIOGRAM N/A 12/25/2012   Procedure: LEFT HEART CATHETERIZATION WITH CORONARY ANGIOGRAM;  Surgeon: Ozell Fell, MD;  Location: [facility] CATH LAB;  Service: Cardiovascular;  Laterality: N/A;   RADIOLOGY  WITH ANESTHESIA N/A 04/21/2024   Procedure: MRI WITH ANESTHESIA;  Surgeon: Radiologist, Medication, MD;  Location: [facility] OR;  Service: Radiology;  Laterality: N/A;  BRAIN W/ W/O   ROBOTIC ASSISTED TOTAL HYSTERECTOMY WITH BILATERAL SALPINGO OOPHERECTOMY N/A 12/06/2021   Procedure: XI ROBOTIC ASSISTED  TOTAL HYSTERECTOMY WITH BILATERAL SALPINGO OOPHORECTOMY;  Surgeon: [name] SAUNDERS, MD;  Location: [facility] ORS;  Service: Gynecology;  Laterality: N/A;   UMBILICAL HERNIA REPAIR  05/04/1989   doesn't think they used mesh   HPI:  63 y.o. female presents to [facility] hospital on 05/29/24 for AMS. EEG r/o seizures negative for acute processes. 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 Seen for cognitive-linguistic evaluation 04/23/24 during admission for DKA due to concern for word-finding deficits. At the time of the evaluation, this had improved and pt performed [facility] with tasks related to delayed recall, problem solving (50% accuracy with mental math), and independent use of her phone. PMH includes HTN, lipidemia, DM, CAD, CHF, anxiety, depression, OSA, chronic respiratory failure, metastatic endometrial carcinoma.   Assessment / Plan / Recommendation Clinical Impression  Pt is indpendent at baseline, living with her spouse at home. She is disoriented to time, stating it is May 2022, which she reports is abnormal for her. She endorses some difficulty with word-finding, though this was not observed during spontaneous speech. Administered subsections of the Cognistat with pt also performing WFL on tasks related to auditory comprehension and expressive language. She demonstrates increased difficulty with memory and problem solving, requiring cueing for a delayed recall task and inability to work through a mental math problem even when given paper or encouraged to use her phone's calculator (she could not locate the app on her phone). Discussed f/u to target functional tasks given her level of independence at baseline. Will continue following and feel she may benefit from intensive SLP interventions post-acutely, >3 hrs/day.    SLP Assessment  SLP Recommendation/Assessment: Patient needs continued Speech Language Pathology Services SLP Visit Diagnosis: Cognitive communication deficit (R41.841)      Assistance Recommended at Discharge  Frequent or constant Supervision/Assistance  Functional Status Assessment Patient has had a recent decline in their functional status and demonstrates the ability to make significant improvements in function in a reasonable and predictable amount of time.  Frequency and Duration min 2x/week  2 weeks      SLP Evaluation Cognition  Overall Cognitive Status: Impaired/Different from baseline Arousal/Alertness: Awake/alert Orientation Level: Oriented to person;Oriented to place;Oriented to situation;Disoriented to time Year: 2022 Month: May Attention: Sustained Sustained Attention: Appears intact Memory: Impaired Memory Impairment: Retrieval deficit Awareness: Impaired Awareness Impairment: Emergent impairment Problem Solving: Impaired Problem Solving Impairment: Verbal basic;Functional basic Executive Function: Reasoning Reasoning: Appears intact       Comprehension  Auditory Comprehension Overall Auditory Comprehension: Appears within functional limits for tasks assessed    Expression Expression Primary Mode of Expression: Verbal Verbal Expression Overall Verbal Expression: Appears within functional limits for tasks assessed   Oral / Motor  Oral Motor/Sensory Function Overall Oral Motor/Sensory Function: Within functional limits Motor Speech Overall Motor Speech: Appears within functional limits for tasks assessed            Damien Blumenthal, M.A., CCC-SLP Speech Language Pathology, Acute Rehabilitation Services  Secure Chat preferred [phone]  06/01/2024, 12:08 PM

## 2024-06-01 NOTE — Progress Notes (Signed)
 [facility]   ROUNDING   NOTE Denis Koppel FMW:[id number]  DOB: 1961-01-04  DOA: 05/30/2024  PCP: Lonn Hicks, MD  06/01/2024,8:05 AM  LOS: 1 day    Code Status: Full code     from: Home   63 year old white female BMI 34 class I obesity CAD PCI 2021, HFpEF last echo 60-65% Diabetes type 2 Chronic respiratory failure 2 L with sleep apnea Endometrial carcinoma diagnosed 2023 with recurrence 03/2024 under care of Dr. Mellie with hysterectomy BSO vaginal brachytherapy 2023 adjuvant radiation Chronic pain syndrome on oxycodone  Lyrica   Hospitalizations 8/18-8/22-metabolic encephalopathy paraphasia likely polypharmacy and DKA pain medications downward adjusted Grew lactobacillus received ceftriaxone  IV 3 days p.o. cefadroxil -complication of voiding issues previously was on catheter-pain meds were downward adjusted port was placed 9/1 through 9/2 polypharmacy DKA UTI-palliative care was involved in discussions at that time-she was recommended to de-escalate her polypharmacy 9/18-9/19 2025 DKA admission recurrent urinary infections  9/25 seen by Dr. Mellie in office-note she had cancer recurrence 03/2024 when severe back pain workup showed recurrent disease and retroperitoneal lymph node She did receive 1 cycle of treatment with chemo-Keytruda  Taxol  carboplatin  and is supposed to follow for repeat cycle 9/30 2025  9/27 intermittent confusion weakness fatigue X 4 days with sudden loss of speech 2139/27 in addition to right sided headaches nausea vomiting weakness and left leg hip flexor with multiple falls Found to have hypodense lesions right posterior temporal occipital lobes?  Acute/subacute infarct was not a candidate for TNK Stroke team consulted She was also found to be in DKA  Labs on admit Sodium 133 potassium 4.1 BUN/creatinine 8/0.7 alk phos 90 AST/ALT 18/7 total bili 0.3-HDL 38 triglycerides 170 Hemoglobin 8.1 platelet 193 WBC 7.1 UA negative  Imaging Chest x-ray no active disease CT  head new low-density areas posterior right temporal lobe occipital lobe?  Acute/subacute infarcts MRI brain numerous small acute embolic infarcts in bilateral anterior posterior circulation superimposed confluent right PCA territory infarct Cytotoxic edema no intracranial mass effect  Neurology saw the patient felt stroke was secondary to hypercoagulable state from advanced malignancy and they discontinued Effient  and started aspirin  325 and recommended Eliquis after discussion with cardiology 9/28 EEG abnormal due to mild diffuse slowing from global cerebral dysfunction   Assessment  & Plan :     Stroke from hypercoagulable state underlying endometrial cancer 325 aspirin -given previous CAD in 2021 --D/W cardiology they are okay with Eliquis instead of Effient  GDMT Lipitor  80 Echo pending read pending Therapy eval's pending Endometrial CA recurrence 03/22/2024 Resumed chemo with port DM TY 2 with propensity for DKA and hyperglycemia A1c currently 7.2 BMI 34 CBGs ranging 140s to 230 Eating 75% of meals Taking Lantus  20 and sliding scale Resume metformin 1000 twice daily today Extensive CAD history with HFpEF previously on Effient  status post PCI 2021 Chronic pain syndrome secondary to cancer related pain with admissions for metabolic encephalopathy previously De-escalation of opiates in the past recently Continues oxycodone  10 every 6 as needed Husband is unclear if she is taking Prozac -resumed for now 40 mg--stopped cyproheptadine , Benadryl  PM Compazine  which can cause confusion and encephalopathy Cautious resumption Lyrica  50 3 times daily and watch mentation closely Recurrent urinary infections Chronic respiratory failure at baseline on 2 L Underlying OSA CPAP at night, 2 L oxygen  during the day Continue respiratory agents guaifenesin  600 twice daily   Discussed with husband  Data Reviewed:   Sodium 133 potassium 4.3 BUN/creatinine 7/0.6 WBC 6.2 hemoglobin 8.4 platelet  217  DVT prophylaxis: Lovenox   Status is: Inpatient Remains inpatient appropriate because:   CR     Current Dispo: CIR     Subjective:   More awake coherent slightly slow speech confabulates a little and misses some words here and there memory is a little impaired No other issues    Objective + exam Vitals:   05/31/24 1523 05/31/24 2020 05/31/24 2309 06/01/24 0326  BP: (!) 120/56 (!) 98/51 (!) 101/50 101/63  Pulse: 68 61 63 61  Resp: 15 18 18 18   Temp: 98 F (36.7 C) (!) 97.5 F (36.4 C) 98 F (36.7 C) 97.6 F (36.4 C)  TempSrc: Oral Oral Oral   SpO2: 99% 100% 100% 100%  Weight:      Height:       Filed Weights   05/30/24 2337  Weight: 85 kg     Examination: Power 5/5 major muscle groups finger-nose-finger intact No icterus no pallor Shoulder shrug bilaterally equal Reflexes deferred Sensory intact Abdomen soft no rebound S1-S2 no murmur on telemetry NSR first-degree AV block No lower extremity edema     Scheduled Meds:   stroke: early stages of recovery book   Does not apply Once   aspirin  EC  325 mg Oral Daily   atorvastatin   80 mg Oral QPM   Chlorhexidine  Gluconate Cloth  6 each Topical Daily   enoxaparin  (LOVENOX ) injection  40 mg Subcutaneous Q24H   FLUoxetine   40 mg Oral Daily   insulin  aspart  0-5 Units Subcutaneous QHS   insulin  aspart  0-9 Units Subcutaneous TID WC   insulin  glargine  20 Units Subcutaneous QHS   pregabalin   50 mg Oral TID   sodium chloride  flush  10-40 mL Intracatheter Q12H   Continuous Infusions:  Time 46  Jai-Gurmukh Leonid Manus, MD  [facility]

## 2024-06-01 NOTE — Telephone Encounter (Signed)
Patient Product/process development scientist completed.    The patient is insured through [facility].     Ran test claim for Eliquis 5 mg and the current 30 day co-pay is $4.00.   This test claim was processed through [facility]- copay amounts may vary at other pharmacies due to pharmacy/plan contracts, or as the patient moves through the different stages of their insurance plan.     Roland Earl, CPHT Pharmacy Technician III Certified Patient Advocate [facility] Pharm[facility] Patient Advocate Team Direct Number: [phone]  Fax: [phone]

## 2024-06-02 ENCOUNTER — Inpatient Hospital Stay

## 2024-06-02 ENCOUNTER — Inpatient Hospital Stay (HOSPITAL_COMMUNITY)

## 2024-06-02 DIAGNOSIS — I69320 Aphasia following cerebral infarction: Secondary | ICD-10-CM

## 2024-06-02 DIAGNOSIS — I639 Cerebral infarction, unspecified: Secondary | ICD-10-CM | POA: Diagnosis not present

## 2024-06-02 DIAGNOSIS — I82451 Acute embolism and thrombosis of right peroneal vein: Secondary | ICD-10-CM

## 2024-06-02 LAB — GLUCOSE, CAPILLARY
Glucose-Capillary: 244 mg/dL — ABNORMAL HIGH (ref 70–99)
Glucose-Capillary: 252 mg/dL — ABNORMAL HIGH (ref 70–99)
Glucose-Capillary: 191 mg/dL — ABNORMAL HIGH (ref 70–99)
Glucose-Capillary: 131 mg/dL — ABNORMAL HIGH (ref 70–99)

## 2024-06-02 MED ORDER — INSULIN GLARGINE 100 UNIT/ML ~~LOC~~ SOLN
24.0000 [IU] | Freq: Every day | SUBCUTANEOUS | Status: DC
  Administered 2024-06-02: 24 [IU] via SUBCUTANEOUS
  Filled 2024-06-02 (×2): qty 0.24

## 2024-06-02 MED ORDER — INSULIN DEGLUDEC 100 UNIT/ML ~~LOC~~ SOPN: 24.0000 [IU] | PEN_INJECTOR | Freq: Every evening | SUBCUTANEOUS | Status: DC

## 2024-06-02 MED ORDER — INSULIN ASPART 100 UNIT/ML IJ SOLN
3.0000 [IU] | Freq: Three times a day (TID) | INTRAMUSCULAR | Status: DC
  Administered 2024-06-02: 3 [IU] via SUBCUTANEOUS

## 2024-06-02 NOTE — Consult Note (Signed)
 Physical Medicine and Rehabilitation Consult Reason for Consult:impaired functional mobility, aphasia Referring Physician: Jerri   HPI: Brenda [name] is a 63 y.o. female with a history of hypertension, stage IV metastatic uterine cancer, diabetes, obstructive sleep apnea on chronic CPAP and oxygen  who was admitted on 05/31/2024 with word finding deficits.  CT of the head was concerning for an acute to subacute infarct in the right temporal and occipital lobe.  CTA of the head and neck previously had been unremarkable.  MRI showed numerous small acute embolic infarcts in the bilateral anterior posterior circulations.  Patient was on aspirin  prior to arrival but was changed to Eliquis for her embolic stroke which was felt to be due to her hyper vagal state related to her metastatic uterine cancer.  Patient was up with therapies yesterday and transferred to sit to stand with contact-guard assist.  She ambulated 60 feet at min assist to contact-guard assist.  For self-care patient required minimal to moderate assistance for basic tasks.  Patient lives at home with her spouse in a 1 level house with ramped entry.  Patient was independent and driving prior to arrival.  She did have some recent falls.    Home: Home Living Family/patient expects to be discharged to:: Private residence Living Arrangements: Children, Spouse/significant other Available Help at Discharge: Family, Available PRN/intermittently Type of Home: House Home Access: Ramped entrance Home Layout: One level Bathroom Shower/Tub: Engineer, manufacturing systems: Standard Bathroom Accessibility: Yes Home Equipment: Rollator (4 wheels), BSC/3in1, Shower seat, Grab bars - toilet, Grab bars - tub/shower (transport chair) Additional Comments: Pt has intermittently used rollator but does not want to use one; family uses transport w/c for community distances  Lives With: Spouse  Functional History: Prior Function Prior Level of  Function : History of Falls (last six months), Independent/Modified Independent, Driving Mobility Comments: Recent falls, has used Rollator in past ADLs Comments: Pt has driven short distances a few times since early August. Manages her medications via pillbox. Functional Status:  Mobility: Bed Mobility Overal bed mobility: Needs Assistance Bed Mobility: Supine to Sit Supine to sit: Supervision, HOB elevated, Used rails General bed mobility comments: Exited to the R side Transfers Overall transfer level: Needs assistance Equipment used: None Transfers: Sit to/from Stand, Bed to chair/wheelchair/BSC Sit to Stand: Contact guard assist Bed to/from chair/wheelchair/BSC transfer type:: Step pivot Step pivot transfers: Contact guard assist General transfer comment: Stood from bed height & lower toilet height with cues for hand placement. Cued to use grab bars to assist in bathroom. Ambulation/Gait Ambulation/Gait assistance: Min assist, Contact guard assist Gait Distance (Feet): 60 Feet Assistive device: Rollator (4 wheels) Gait Pattern/deviations: Step-through pattern, Decreased stride length, Drifts right/left General Gait Details: Pt initially minA with lateral LOB with no AD, progressing to CGA with Rollator, requires seated rest break in hall due to feeling like legs were giving out. Gait velocity: reduced Gait velocity interpretation: <1.8 ft/sec, indicate of risk for recurrent falls    ADL: ADL Overall ADL's : Needs assistance/impaired Grooming: Moderate assistance, Cueing for sequencing, Standing, Wash/dry hands, Oral care Grooming Details (indicate cue type and reason): cues for identifying grooming supplies & appropriate items on wall (light switch, paper towel & soap dispensers), pt attempting to turn faucet on by moving lightswitch and touching open outlet Lower Body Dressing: Moderate assistance, Cueing for sequencing, Sitting/lateral leans Lower Body Dressing Details  (indicate cue type and reason): adjusting B socks Toilet Transfer: Minimal assistance, Ambulation, Regular Teacher, adult education  Details (indicate cue type and reason): cues to safely approach toilet Functional mobility during ADLs: Minimal assistance, Cueing for safety, Cueing for sequencing  Cognition: Cognition Overall Cognitive Status: Impaired/Different from baseline Arousal/Alertness: Awake/alert Orientation Level: Oriented to person, Oriented to place, Oriented to situation, Disoriented to time Year: 2022 Month: May Attention: Sustained Sustained Attention: Appears intact Memory: Impaired Memory Impairment: Retrieval deficit Awareness: Impaired Awareness Impairment: Emergent impairment Problem Solving: Impaired Problem Solving Impairment: Verbal basic, Functional basic Executive Function: Reasoning Reasoning: Appears intact Cognition Arousal: Alert Behavior During Therapy: Flat affect Overall Cognitive Status: Impaired/Different from baseline   Review of Systems  Constitutional:  Positive for malaise/fatigue.  HENT: Negative.    Eyes: Negative.   Respiratory: Negative.    Cardiovascular: Negative.   Gastrointestinal:  Positive for abdominal pain.  Genitourinary: Negative.   Musculoskeletal:  Positive for back pain and joint pain.  Skin: Negative.   Neurological:  Positive for speech change, focal weakness and weakness.  Psychiatric/Behavioral:  Negative for memory loss.    Past Medical History:  Diagnosis Date   Anemia    Arthritis    back, hands, hips (02/22/2015)   CAD (coronary artery disease)    a. complex LAD/diagonal bifurcation PCI in 2010. b. STEMI 10/2019 s/p PTCA/DES x1 to mLAD overlapping the old stent, residual disease treated medicaly.   Cancer [facility])    uterine   CHF (congestive heart failure) (HCC)    Depression    Diabetes mellitus (HCC)    started when I was pregnant; not sure if it was type 1 or type 2    History of radiation therapy     Endometrium- HDR 01/22/22-03/07/22- Dr. Lynwood Nasuti   Hypercholesterolemia    Hypertension    Morbid obesity (HCC)    Myocardial infarction (HCC)    mild x 3   Neuromuscular disorder (HCC)    neuropathy feet   Sleep apnea    cpap/   Past Surgical History:  Procedure Laterality Date   CARDIAC CATHETERIZATION  12/02/2012   CHOLECYSTECTOMY OPEN  09/04/1987   CORONARY ANGIOPLASTY WITH STENT PLACEMENT  09/03/2009   CORONARY/GRAFT ACUTE MI REVASCULARIZATION N/A 10/26/2019   Procedure: CORONARY/GRAFT ACUTE MI REVASCULARIZATION;  Surgeon: Verlin Lonni BIRCH, MD;  Location: [facility] INVASIVE CV LAB;  Service: Cardiovascular;  Laterality: N/A;   INCISIONAL HERNIA REPAIR N/A 12/09/2021   Procedure: LAPAROSCOPIC REPAIR UMBILICAL HERNIA WITH MESH;  Surgeon: Vernetta Berg, MD;  Location: [facility] ORS;  Service: General;  Laterality: N/A;   IR IMAGING GUIDED PORT INSERTION  04/08/2024   LEFT HEART CATH AND CORONARY ANGIOGRAPHY N/A 10/26/2019   Procedure: LEFT HEART CATH AND CORONARY ANGIOGRAPHY;  Surgeon: Verlin Lonni BIRCH, MD;  Location: [facility] INVASIVE CV LAB;  Service: Cardiovascular;  Laterality: N/A;   LEFT HEART CATHETERIZATION WITH CORONARY ANGIOGRAM N/A 12/25/2012   Procedure: LEFT HEART CATHETERIZATION WITH CORONARY ANGIOGRAM;  Surgeon: Ozell Fell, MD;  Location: [facility] CATH LAB;  Service: Cardiovascular;  Laterality: N/A;   RADIOLOGY WITH ANESTHESIA N/A 04/21/2024   Procedure: MRI WITH ANESTHESIA;  Surgeon: Radiologist, Medication, MD;  Location: [facility] OR;  Service: Radiology;  Laterality: N/A;  BRAIN W/ W/O   ROBOTIC ASSISTED TOTAL HYSTERECTOMY WITH BILATERAL SALPINGO OOPHERECTOMY N/A 12/06/2021   Procedure: XI ROBOTIC ASSISTED TOTAL HYSTERECTOMY WITH BILATERAL SALPINGO OOPHORECTOMY;  Surgeon: [name] SAUNDERS, MD;  Location: [facility] ORS;  Service: Gynecology;  Laterality: N/A;   UMBILICAL HERNIA REPAIR  05/04/1989   doesn't think they used mesh   Family History  Problem Relation Age of  Onset    Coronary artery disease Mother        in her mid 58s   Hypertension Mother    Cancer Mother        skin   Heart attack Father        in his mid 76s   COPD Father    Stroke Sister    Coronary artery disease Sister    Diabetes Sister    Other Half-Sister    Thyroid  cancer Daughter    Prostate cancer Neg Hx    Colon cancer Neg Hx    Breast cancer Neg Hx    Endometrial cancer Neg Hx    Ovarian cancer Neg Hx    Pancreatic cancer Neg Hx    Social History:  reports that she quit smoking about 37 years ago. Her smoking use included cigarettes and cigars. She started smoking about 38 years ago. She has a 0.5 pack-year smoking history. She has never used smokeless tobacco. She reports that she does not drink alcohol and does not use drugs. Allergies:  Allergies  Allergen Reactions   Hydrocodone Shortness Of Breath, Dermatitis and Other (See Comments)    I forget to breathe.   Clopidogrel Rash and Dermatitis   Medications Prior to Admission  Medication Sig Dispense Refill   acetaminophen  (TYLENOL ) 500 MG tablet Take 1-2 tablets (500-1,000 mg total) by mouth every 8 (eight) hours as needed for mild pain (pain score 1-3) or moderate pain (pain score 4-6).     atorvastatin  (LIPITOR ) 80 MG tablet Take 1 tablet (80 mg total) by mouth every evening. 30 tablet 6   buPROPion  (WELLBUTRIN  XL) 150 MG 24 hr tablet Take 150 mg by mouth in the morning.     Cholecalciferol  (VITAMIN D3 SUPER STRENGTH) 50 MCG (2000 UT) CAPS Take 2,000 Units by mouth in the morning.     cyproheptadine  (PERIACTIN ) 4 MG tablet Take 4 mg by mouth 3 (three) times daily.     diphenhydrAMINE  (BENADRYL ) 25 mg capsule Take 25 mg by mouth at bedtime as needed for sleep.     FIASP  FLEXTOUCH 100 UNIT/ML FlexTouch Pen Inject 5 Units into the skin with breakfast, with lunch, and with evening meal.     FLUoxetine  (PROZAC ) 40 MG capsule Take 40 mg by mouth daily.     guaiFENesin  (MUCINEX ) 600 MG 12 hr tablet Take 1 tablet (600 mg total)  by mouth 2 (two) times daily as needed for cough or to loosen phlegm. 30 tablet 0   insulin  degludec (TRESIBA) 100 UNIT/ML FlexTouch Pen Inject 24 Units into the skin every evening.     lactulose  (CHRONULAC ) 10 GM/15ML solution Take 45 mLs (30 g total) by mouth 2 (two) times daily as needed for mild constipation or moderate constipation. 236 mL 0   lidocaine  (LIDODERM ) 5 % Place 1 patch onto the skin daily.     lidocaine -prilocaine  (EMLA ) cream Apply to affected area once (Patient taking differently: Apply 1 Application topically as needed [facility]). Apply to affected area once) 30 g 3   losartan  (COZAAR ) 25 MG tablet Take 1 tablet (25 mg total) by mouth daily. 90 tablet 3   metFORMIN (GLUCOPHAGE-XR) 500 MG 24 hr tablet Take 1,000 mg by mouth in the morning and at bedtime.     Misc Natural Products (NEURIVA PO) Take 1 tablet by mouth daily.     Misc. Devices MISC Portable oxygen  concentrator, 2L oxygen .  Diagnoses-chronic respiratory failure with hypoxia 1 each 0   nitroGLYCERIN  (  NITROSTAT ) 0.4 MG SL tablet Dissolve 1 tablet under the tongue every 5 minutes as needed for chest pain. Max of 3 doses, then 911. 25 tablet 6   ondansetron  (ZOFRAN ) 8 MG tablet Take 1 tablet (8 mg total) by mouth every 8 (eight) hours as needed for nausea or vomiting. Start on the third day after carboplatin . 30 tablet 1   Oxycodone  HCl 10 MG TABS Take 1 tablet (10 mg total) by mouth every 6 (six) hours as needed. 90 tablet 0   OXYGEN  Inhale 2 L/min into the lungs continuous.     polyethylene glycol powder (GLYCOLAX /MIRALAX ) 17 GM/SCOOP powder Take 17 g by mouth daily as needed for mild constipation.     prasugrel  (EFFIENT ) 5 MG TABS tablet Take 1 tablet (5 mg total) by mouth in the morning. AFTER TAKING YOUR DOSE ON 04/07/2024, STOP TAKING IT IN PREPARATION FOR THE BIOPSY ON 04/15/2024. AFTER THE BIOPSY, FOLLOW THE RADIOLOGIST RECOMMENDATIONS AS TO TIMING OF RESTARTING THIS MEDICINE.     pregabalin  (LYRICA ) 50 MG capsule  Take 1 capsule (50 mg total) by mouth 2 (two) times daily. (Patient taking differently: Take 50 mg by mouth 3 (three) times daily.) 60 capsule 0   prochlorperazine  (COMPAZINE ) 10 MG tablet Take 1 tablet (10 mg total) by mouth every 6 (six) hours as needed for nausea or vomiting. 30 tablet 1   senna-docusate (SENOKOT-S) 8.6-50 MG tablet Take 1-2 tablets by mouth 2 (two) times daily between meals as needed for mild constipation or moderate constipation. (Patient taking differently: Take 1-2 tablets by mouth as needed for mild constipation or moderate constipation.)       Blood pressure 105/74, pulse (!) 59, temperature 97.9 F (36.6 C), temperature source Oral, resp. rate 19, height 5' 2 (1.575 m), weight 85 kg, last menstrual period 10/30/2014, SpO2 98%. Physical Exam Constitutional:      Appearance: She is obese.  HENT:     Head: Normocephalic.     Right Ear: External ear normal.     Left Ear: External ear normal.     Nose: Nose normal.     Mouth/Throat:     Pharynx: Oropharynx is clear.  Eyes:     Conjunctiva/sclera: Conjunctivae normal.  Cardiovascular:     Rate and Rhythm: Bradycardia present.  Pulmonary:     Effort: Pulmonary effort is normal.  Abdominal:     Palpations: Abdomen is soft.  Musculoskeletal:        General: Swelling present.     Cervical back: Normal range of motion.  Skin:    General: Skin is warm.  Neurological:     Mental Status: She is alert.     Comments: Pt is alert, oriented x 3. Occasional word finding deficits and processing delays. CN exam was non-focal. RUE 4+/5, LUE 4/5. RLE 4 to 4+/5 prox to distal. LLE 3+ to 4/5 prox to distal. I found no consistent sensory deficits. No abnl tone. DTR's 1+  Psychiatric:        Mood and Affect: Mood normal.        Behavior: Behavior normal.     Results for orders placed or performed during the hospital encounter of 05/30/24 (from the past 24 hours)  Glucose, capillary     Status: Abnormal   Collection Time:  06/01/24 11:54 AM  Result Value Ref Range   Glucose-Capillary 238 (H) 70 - 99 mg/dL   Comment 1 Notify RN   Glucose, capillary     Status: Abnormal  Collection Time: 06/01/24  3:36 PM  Result Value Ref Range   Glucose-Capillary 239 (H) 70 - 99 mg/dL   Comment 1 Notify RN   Glucose, capillary     Status: Abnormal   Collection Time: 06/01/24  9:18 PM  Result Value Ref Range   Glucose-Capillary 243 (H) 70 - 99 mg/dL   Comment 1 Notify RN   Glucose, capillary     Status: Abnormal   Collection Time: 06/02/24  6:11 AM  Result Value Ref Range   Glucose-Capillary 244 (H) 70 - 99 mg/dL   Comment 1 Notify RN    ECHOCARDIOGRAM COMPLETE Result Date: 06/01/2024    ECHOCARDIOGRAM REPORT   Patient Name:   Brenda [name] Date of Exam: 06/01/2024 Medical Rec #:  [id number]           Height:       62.0 in Accession #:    [id number]          Weight:       187.4 lb Date of Birth:  09-Jan-1961           BSA:          1.860 m Patient Age:    63 years            BP:           101/63 mmHg Patient Gender: F                   HR:           56 bpm. Exam Location:  Inpatient Procedure: 2D Echo, Cardiac Doppler, Color Doppler and Saline Contrast Bubble            Study (Both Spectral and Color Flow Doppler were utilized during            procedure). Indications:    Stroke  History:        Patient has prior history of Echocardiogram examinations, most                 recent 04/21/2024. CHF, CAD, Signs/Symptoms:Dyspnea; Risk                 Factors:Hypertension, Dyslipidemia, Sleep Apnea and Diabetes.  Sonographer:    Philomena Daring Referring Phys: ERIC J [place] IMPRESSIONS  1. Left ventricular ejection fraction, by estimation, is 55 to 60%. The left ventricle has normal function. The left ventricle has no regional wall motion abnormalities. Left ventricular diastolic parameters are consistent with Grade II diastolic dysfunction (pseudonormalization).  2. Right ventricular systolic function is low normal. The right  ventricular size is normal.  3. The mitral valve is normal in structure. No evidence of mitral valve regurgitation. No evidence of mitral stenosis.  4. The aortic valve is calcified. There is moderate calcification of the aortic valve. There is moderate thickening of the aortic valve. Aortic valve regurgitation is not visualized. Mild aortic valve stenosis. Aortic valve area, by VTI measures 1.47 cm. Aortic valve mean gradient measures 15.0 mmHg. Aortic valve Vmax measures 2.71 m/s.  5. The inferior vena cava is normal in size with greater than 50% respiratory variability, suggesting right atrial pressure of 3 mmHg. FINDINGS  Left Ventricle: Left ventricular ejection fraction, by estimation, is 55 to 60%. The left ventricle has normal function. The left ventricle has no regional wall motion abnormalities. The left ventricular internal cavity size was normal in size. There is  no left ventricular hypertrophy. Left ventricular diastolic parameters are consistent with  Grade II diastolic dysfunction (pseudonormalization). Right Ventricle: The right ventricular size is normal. No increase in right ventricular wall thickness. Right ventricular systolic function is low normal. Left Atrium: Left atrial size was normal in size. Right Atrium: Right atrial size was normal in size. Pericardium: There is no evidence of pericardial effusion. Mitral Valve: The mitral valve is normal in structure. No evidence of mitral valve regurgitation. No evidence of mitral valve stenosis. Tricuspid Valve: The tricuspid valve is normal in structure. Tricuspid valve regurgitation is not demonstrated. No evidence of tricuspid stenosis. Aortic Valve: The aortic valve is calcified. There is moderate calcification of the aortic valve. There is moderate thickening of the aortic valve. Aortic valve regurgitation is not visualized. Mild aortic stenosis is present. Aortic valve mean gradient measures 15.0 mmHg. Aortic valve peak gradient measures 29.4  mmHg. Aortic valve area, by VTI measures 1.47 cm. Pulmonic Valve: The pulmonic valve was normal in structure. Pulmonic valve regurgitation is not visualized. No evidence of pulmonic stenosis. Aorta: The aortic root is normal in size and structure. Venous: The inferior vena cava is normal in size with greater than 50% respiratory variability, suggesting right atrial pressure of 3 mmHg. IAS/Shunts: No atrial level shunt detected by color flow Doppler. Agitated saline contrast was given intravenously to evaluate for intracardiac shunting.  LEFT VENTRICLE PLAX 2D LVIDd:         5.20 cm   Diastology LVIDs:         3.60 cm   LV e' medial:    6.20 cm/s LV PW:         1.20 cm   LV E/e' medial:  19.5 LV IVS:        1.00 cm   LV e' lateral:   6.42 cm/s LVOT diam:     2.00 cm   LV E/e' lateral: 18.8 LV SV:         90 LV SV Index:   48 LVOT Area:     3.14 cm  RIGHT VENTRICLE            IVC RV S prime:     8.05 cm/s  IVC diam: 1.80 cm TAPSE (M-mode): 1.5 cm LEFT ATRIUM             Index        RIGHT ATRIUM           Index LA diam:        3.80 cm 2.04 cm/m   RA Area:     12.20 cm LA Vol (A2C):   52.0 ml 27.96 ml/m  RA Volume:   23.10 ml  12.42 ml/m LA Vol (A4C):   50.1 ml 26.94 ml/m LA Biplane Vol: 52.2 ml 28.07 ml/m  AORTIC VALVE AV Area (Vmax):    1.36 cm AV Area (Vmean):   1.36 cm AV Area (VTI):     1.47 cm AV Vmax:           271.00 cm/s AV Vmean:          179.250 cm/s AV VTI:            0.612 m AV Peak Grad:      29.4 mmHg AV Mean Grad:      15.0 mmHg LVOT Vmax:         117.00 cm/s LVOT Vmean:        77.600 cm/s LVOT VTI:          0.286 m LVOT/AV VTI ratio: 0.47  AORTA Ao  Root diam: 2.80 cm Ao Asc diam:  3.50 cm MITRAL VALVE MV Area (PHT): 2.14 cm     SHUNTS MV Decel Time: 355 msec     Systemic VTI:  0.29 m MV E velocity: 121.00 cm/s  Systemic Diam: 2.00 cm MV A velocity: 123.00 cm/s MV E/A ratio:  0.98 Morene Brownie Electronically signed by Morene Brownie Signature Date/Time: 06/01/2024/9:38:18 PM    Final     EEG adult Result Date: 06/01/2024 Matthews Elida HERO, MD     06/01/2024  7:53 AM Routine EEG Report Brenda [name] is a 63 y.o. female with a history of altered mental status who is undergoing an EEG to evaluate for seizures. Report: This EEG was acquired with electrodes placed according to the International 10-20 electrode system (including Fp1, Fp2, F3, F4, C3, C4, P3, P4, O1, O2, T3, T4, T5, T6, A1, A2, Fz, Cz, Pz). The following electrodes were missing or displaced: none. The occipital dominant rhythm was 6-7 Hz. This activity is reactive to stimulation. Drowsiness was manifested by background fragmentation; deeper stages of sleep were not identified. There was no focal slowing. There were no interictal epileptiform discharges. There were no electrographic seizures identified. Photic stimulation and hyperventilation were not performed. Impression and clinical correlation: This EEG was obtained while awake and drowsy and is abnormal due to mild diffuse slowing indicative of global cerebral dysfunction. Epileptiform abnormalities were not seen during this recording. Elida Matthews, MD [facility] [phone] If 7pm- 7am, please page neurology on call as listed in AMION.   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 Confusion, weakness, neurologic deficit. EXAM: MRI HEAD WITHOUT AND WITH CONTRAST TECHNIQUE: Multiplanar, multiecho pulse sequences of the brain and surrounding structures were obtained without and with intravenous contrast. CONTRAST:  8mL GADAVIST  GADOBUTROL  1 MMOL/ML IV SOLN COMPARISON:  Head CT without contrast 0101 hours today. Brain MRI 04/21/2024. FINDINGS: Brain: Confluent restricted diffusion in the right PCA territory from the posterior mesial temporal lobe, patchy right occipital lobe involvement, periatrial white matter involvement and marginal involvement of the splenium of the corpus  callosum. Additional numerous small foci of restricted diffusion scattered in the bilateral cerebellum and cerebral hemispheres. Anterior and posterior circulation affected bilaterally. Deep gray nuclei and brainstem are relatively spared. Cytotoxic edema in the affected areas. No intracranial hemorrhage identified. Luxury perfusion enhancement appearance in the right PCA territory (series 17, image 13). No masslike or suspicious postcontrast enhancement. No dural thickening. No midline shift, ventriculomegaly, evidence of discrete intracranial mass. Cervicomedullary junction and pituitary are within normal limits. No abnormal enhancement identified. Evidence of mass lesion, ventriculomegaly, extra-axial collection or acute intracranial hemorrhage. Cervicomedullary junction and pituitary are within normal limits. Vascular: Major intracranial vascular flow voids are preserved. Skull and upper cervical spine: Visualized bone marrow signal is within normal limits. Negative visible cervical spine and spinal cord. Sinuses/Orbits: Negative. Other: Stable mild mastoid effusion since last month. IMPRESSION: 1. Numerous small acute embolic infarcts in the bilateral anterior and posterior circulation, with superimposed confluent Right PCA territory infarct corresponding to that seen on CT this morning. 2. Cytotoxic edema with no intracranial hemorrhage or mass effect. 3. No intracranial metastatic disease identified. Electronically Signed   By: VEAR Hurst M.D.   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 Yes Co-Morbidities requiring supervision/potential complications:  - Stage IV uterine cancer -History of CAD and NSTEMI -Uncontrolled  diabetes -Hypertension -Morbid obesity with OSA on CPAP and oxygen  Due to bladder management, bowel management, safety, skin/wound care, disease management, medication administration, pain management, and patient education, does the patient require 24 hr/day rehab nursing? Yes Does the patient require coordinated care of a physician, rehab nurse, therapy disciplines of PT, OT, SLP to address physical and functional deficits in the context of the above medical diagnosis(es)? Yes Addressing deficits in the following areas: balance, endurance, locomotion, strength, transferring, bowel/bladder control, bathing, dressing, feeding, grooming, toileting, cognition, language, and psychosocial support Can the patient actively participate in an intensive therapy program of at least 3 hrs of therapy per day at least 5 days per week? Yes The potential for patient to make measurable gains while on inpatient rehab is excellent Anticipated functional outcomes upon discharge from inpatient rehab are modified independent and supervision  with PT, modified independent and supervision with OT, modified independent and supervision with SLP. Estimated rehab length of stay to reach the above functional goals is: 7-10 days Anticipated discharge destination: Home Overall Rehab/Functional Prognosis: excellent  POST ACUTE RECOMMENDATIONS: This patient's condition is appropriate for continued rehabilitative care in the following setting: CIR Patient has agreed to participate in recommended program. Yes Note that insurance prior authorization may be required for reimbursement for recommended care.  Comment: Rehab Admissions Coordinator to follow up.     I have personally performed a face to face diagnostic evaluation of this patient. Additionally, I have examined the patient's medical record including any pertinent labs and radiographic images.    Thanks,  Arthea ONEIDA Gunther, MD 06/02/2024

## 2024-06-02 NOTE — Progress Notes (Signed)
 [facility]   ROUNDING   NOTE Brenda [name] FMW:[id number]  DOB: 1961/03/19  DOA: 05/30/2024  PCP: Lonn Hicks, MD  06/02/2024,1:33 PM  LOS: 2 days    Code Status: Full code     from: Home   63 year old white female BMI 34 class I obesity CAD PCI 2021, HFpEF last echo 60-65% Diabetes type 2 Chronic respiratory failure 2 L with sleep apnea Endometrial carcinoma diagnosed 2023 with recurrence 03/2024 under care of Dr. Mellie with hysterectomy BSO vaginal brachytherapy 2023 adjuvant radiation Chronic pain syndrome on oxycodone  Lyrica   Hospitalizations 8/18-8/22-metabolic encephalopathy paraphasia likely polypharmacy and DKA pain medications downward adjusted Grew lactobacillus received ceftriaxone  IV 3 days p.o. cefadroxil -complication of voiding issues previously was on catheter-pain meds were downward adjusted port was placed 9/1 through 9/2 polypharmacy DKA UTI-palliative care was involved in discussions at that time-she was recommended to de-escalate her polypharmacy 9/18-9/19 2025 DKA admission recurrent urinary infections  9/25 seen by Dr. Mellie in office-note she had cancer recurrence 03/2024 when severe back pain workup showed recurrent disease and retroperitoneal lymph node She did receive 1 cycle of treatment with chemo-Keytruda  Taxol  carboplatin  and is supposed to follow for repeat cycle 9/30 2025  9/27 intermittent confusion weakness fatigue X 4 days with sudden loss of speech 2139/27 in addition to right sided headaches nausea vomiting weakness and left leg hip flexor with multiple falls Found to have hypodense lesions right posterior temporal occipital lobes?  Acute/subacute infarct was not a candidate for TNK Stroke team consulted She was also found to have significant hyperglycemia initially  Labs on admit Sodium 133 potassium 4.1 BUN/creatinine 8/0.7 alk phos 90 AST/ALT 18/7 total bili 0.3-HDL 38 triglycerides 170 Hemoglobin 8.1 platelet 193 WBC 7.1 UA  negative  Imaging Chest x-ray no active disease CT head new low-density areas posterior right temporal lobe occipital lobe?  Acute/subacute infarcts MRI brain numerous small acute embolic infarcts in bilateral anterior posterior circulation superimposed confluent right PCA territory infarct Cytotoxic edema no intracranial mass effect  Neurology saw the patient felt stroke was secondary to hypercoagulable state from advanced malignancy and they discontinued Effient  and started aspirin  325 and recommended Eliquis after discussion with cardiology 9/28 EEG abnormal due to mild diffuse slowing from global cerebral dysfunction 9/29 Echo EF 55-60% grade 2 dysfunction mild aortic valve stenosis mitral valve no stenosis  Stabilizing still some word finding difficulties but can go to rehab when bed is available   Assessment  & Plan :     Stroke from hypercoagulable state underlying endometrial cancer 325 aspirin -given previous CAD in 2021 --D/W cardiology on 9/29 and changing to Eliquis 5 twice daily in favour of Effient  has been discontinued GDMT Lipitor  80 ?  Eventual CIR once approved Endometrial CA recurrence 03/22/2024 Resumed chemo with port and Dr. Mellie aware and will follow as outpatient-chemotherapy canceled for 9/30 and will need to be retimed DM TY 2 with propensity for DKA and hyperglycemia A1c currently 7.2 BMI 34 CBGs ranging to 250 range Eating 75% of meals ?to home dose of long-acting 24 units continues metformin twice daily Adding sliding scale coverage 3 units 3 times daily meal May benefit from addition of Jardiance /1/3 agent Extensive CAD history with HFpEF previously on Effient  status post PCI 2021 Chronic pain syndrome secondary to cancer related pain with admissions for metabolic encephalopathy previously De-escalation of opiates in the past recently Continues oxycodone  10 every 6 as needed Stopped cyproheptadine , Benadryl  PM Compazine  which can cause confusion and  encephalopathy Cautious resumption Lyrica  50  3 times daily and watch mentation closely Depression resume Prozac  40 holding Wellbutrin  XL for now 150 [not clear if was taking all of these in the outpatient] Recurrent urinary infections Chronic respiratory failure at baseline on 2 L Underlying OSA CPAP at night, 2 L oxygen  during the day Continue respiratory agents guaifenesin  600 twice daily   Discussed with husband  Data Reviewed:   Sodium 133 potassium 4.3 BUN/creatinine 7/0.6 WBC 6.2 hemoglobin 8.4 platelet 217  DVT prophylaxis: Lovenox   Status is: Inpatient Remains inpatient appropriate because:   CR     Current Dispo: CIR     Subjective:   Speech is more coherent and clear-she misses 1 or 2 words but no word salad She is equally strong in upper extremities and lower Hide difficulty of one-step commands according to PT note She slept well overnight   Objective + exam Vitals:   06/01/24 2326 06/02/24 0351 06/02/24 0744 06/02/24 1116  BP: (!) 112/46 (!) 112/53 105/74 (!) 107/44  Pulse:  60 (!) 59 69  Resp: 20 20 19 19   Temp: 97.9 F (36.6 C) 97.6 F (36.4 C) 97.9 F (36.6 C) 98.2 F (36.8 C)  TempSrc: Oral Axillary Oral Oral  SpO2: 100% 99% 98% 99%  Weight:      Height:       Filed Weights   05/30/24 2337  Weight: 85 kg     Examination:  Finger-nose-finger intact-I do not appreciate field cut Chest is clear Abdomen is soft Speech has reasonable prosody Power is 5/5 overall with slight weakness maybe on the right S1-S2 no murmur   Scheduled Meds:  apixaban  5 mg Oral BID   atorvastatin   80 mg Oral QPM   Chlorhexidine  Gluconate Cloth  6 each Topical Daily   FLUoxetine   40 mg Oral Daily   insulin  aspart  0-5 Units Subcutaneous QHS   insulin  aspart  0-9 Units Subcutaneous TID WC   insulin  aspart  3 Units Subcutaneous TID WC   insulin  glargine  24 Units Subcutaneous Q0600   metFORMIN  1,000 mg Oral BID WC   pregabalin   50 mg Oral TID    sodium chloride  flush  10-40 mL Intracatheter Q12H   Continuous Infusions:  Time 26  Jai-Gurmukh Denym Christenberry, MD  [facility]

## 2024-06-02 NOTE — Progress Notes (Signed)
 Physical Therapy Treatment Patient Details Name: Brenda [name] MRN: [id number] DOB: 1961-08-07 Today's Date: 06/02/2024   History of Present Illness 63 y.o. female presents to [facility] hospital on 05/29/24 for AMS. EEG r/o seizures negative for acute processes. 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 PMH includes HTN, lipidemia, DM, CAD, CHF, anxiety, depression, OSA, chronic respiratory failure, metastatic endometrial carcinoma.    PT Comments  Pt in good spirits this morning, having a visit from her pastor. Pt motivated to participate in physical therapy session. Has some difficulty following 1-2 step commands in terms of Rollator management and requires assist for locking and unlocking brakes. Worked on turn and pivot transitions to address dynamic standing balance, ambulation and sit to stands for functional LE strengthening and power. Pt continues to demonstrate functional weakness, decreased endurance, impaired cognition and balance. Patient will benefit from intensive inpatient follow-up therapy, >3 hours/day.    If plan is discharge home, recommend the following: A little help with walking and/or transfers;A little help with bathing/dressing/bathroom;Assistance with cooking/housework;Assist for transportation;Help with stairs or ramp for entrance   Can travel by private vehicle     Yes  Equipment Recommendations  None recommended by PT    Recommendations for Other Services Rehab consult     Precautions / Restrictions Precautions Precautions: Fall Restrictions Weight Bearing Restrictions Per Provider Order: No     Mobility  Bed Mobility Overal bed mobility: Needs Assistance Bed Mobility: Supine to Sit     Supine to sit: Supervision          Transfers Overall transfer level: Needs assistance Equipment used: Rollator (4 wheels) Transfers: Sit to/from Stand Sit to Stand: Contact guard assist                 Ambulation/Gait Ambulation/Gait assistance: Contact guard assist, Min assist Gait Distance (Feet): 60 Feet (60, 60) Assistive device: Rollator (4 wheels) Gait Pattern/deviations: Step-through pattern, Decreased stride length Gait velocity: reduced     General Gait Details: Pt with slow and steady pace with Rollator, one lateral LOB with turn and pivot towards left, requiring minA to correct. Pt able to verbalize need for seated rest break, but needs assist with  managing Rollator i.e. locking and unlocking brakes and parking Rollator   Stairs             Wheelchair Mobility     Tilt Bed    Modified Rankin (Stroke Patients Only) Modified Rankin (Stroke Patients Only) Pre-Morbid Rankin Score: No significant disability Modified Rankin: Moderately severe disability     Balance Overall balance assessment: Needs assistance Sitting-balance support: Feet supported Sitting balance-Leahy Scale: Good     Standing balance support: Bilateral upper extremity supported, During functional activity Standing balance-Leahy Scale: Poor                              Communication Communication Communication: No apparent difficulties  Cognition Arousal: Alert Behavior During Therapy: WFL for tasks assessed/performed   PT - Cognitive impairments: Awareness, Attention, Sequencing, Problem solving, Safety/Judgement                       PT - Cognition Comments: Some difficulty following 1-2 step commands today Following commands: Impaired Following commands impaired: Follows multi-step commands inconsistently, Follows one step commands inconsistently    Cueing Cueing Techniques: Verbal cues  Exercises Other Exercises Other Exercises: x5 sit to stands Other Exercises: Turn and pivot to  pick up object x 5 in each direction    General Comments        Pertinent Vitals/Pain Pain Assessment Pain Assessment: Faces Faces Pain Scale: No hurt    Home  Living                          Prior Function            PT Goals (current goals can now be found in the care plan section) Acute Rehab PT Goals Patient Stated Goal: less falling PT Goal Formulation: With patient/family Time For Goal Achievement: 06/15/24 Potential to Achieve Goals: Good Progress towards PT goals: Progressing toward goals    Frequency    Min 3X/week      PT Plan      Co-evaluation              AM-PAC PT 6 Clicks Mobility   Outcome Measure  Help needed turning from your back to your side while in a flat bed without using bedrails?: A Little Help needed moving from lying on your back to sitting on the side of a flat bed without using bedrails?: A Little Help needed moving to and from a bed to a chair (including a wheelchair)?: A Little Help needed standing up from a chair using your arms (e.g., wheelchair or bedside chair)?: A Little Help needed to walk in hospital room?: A Little Help needed climbing 3-5 steps with a railing? : A Lot 6 Click Score: 17    End of Session Equipment Utilized During Treatment: Gait belt;Oxygen  Activity Tolerance: Patient tolerated treatment well Patient left: in chair;with call bell/phone within reach;with family/visitor present Nurse Communication: Mobility status PT Visit Diagnosis: Other abnormalities of gait and mobility (R26.89);Muscle weakness (generalized) (M62.81);Other symptoms and signs involving the nervous system (R29.898);Unsteadiness on feet (R26.81);History of falling (Z91.81)     Time: 9047-8985 PT Time Calculation (min) (ACUTE ONLY): 22 min  Charges:    $Therapeutic Activity: 8-22 mins PT General Charges $$ ACUTE PT VISIT: 1 Visit                     Aleck Daring, PT, DPT Acute Rehabilitation Services Office [phone]    Aleck ONEIDA Daring 06/02/2024, 11:50 AM

## 2024-06-02 NOTE — Plan of Care (Signed)
 Problem: Education: Goal: Ability to describe self-care measures that may prevent or decrease complications (Diabetes Survival Skills Education) will improve 06/02/2024 1724 by Lum Look, RN Outcome: Progressing 06/02/2024 1724 by Lum Look, RN Outcome: Progressing Goal: Individualized Educational Video(s) 06/02/2024 1724 by Lum Look, RN Outcome: Progressing 06/02/2024 1724 by Lum Look, RN Outcome: Progressing   Problem: Coping: Goal: Ability to adjust to condition or change in health will improve 06/02/2024 1724 by Lum Look, RN Outcome: Progressing 06/02/2024 1724 by Lum Look, RN Outcome: Progressing   Problem: Fluid Volume: Goal: Ability to maintain a balanced intake and output will improve 06/02/2024 1724 by Lum Look, RN Outcome: Progressing 06/02/2024 1724 by Lum Look, RN Outcome: Progressing   Problem: Health Behavior/Discharge Planning: Goal: Ability to identify and utilize available resources and services will improve 06/02/2024 1724 by Lum Look, RN Outcome: Progressing 06/02/2024 1724 by Lum Look, RN Outcome: Progressing Goal: Ability to manage health-related needs will improve 06/02/2024 1724 by Lum Look, RN Outcome: Progressing 06/02/2024 1724 by Lum Look, RN Outcome: Progressing   Problem: Metabolic: Goal: Ability to maintain appropriate glucose levels will improve 06/02/2024 1724 by Lum Look, RN Outcome: Progressing 06/02/2024 1724 by Lum Look, RN Outcome: Progressing   Problem: Nutritional: Goal: Maintenance of adequate nutrition will improve 06/02/2024 1724 by Lum Look, RN Outcome: Progressing 06/02/2024 1724 by Lum Look, RN Outcome: Progressing Goal: Progress toward achieving an optimal weight will improve 06/02/2024 1724 by Lum Look, RN Outcome: Progressing 06/02/2024 1724 by Lum Look, RN Outcome: Progressing   Problem: Skin  Integrity: Goal: Risk for impaired skin integrity will decrease 06/02/2024 1724 by Lum Look, RN Outcome: Progressing 06/02/2024 1724 by Lum Look, RN Outcome: Progressing   Problem: Tissue Perfusion: Goal: Adequacy of tissue perfusion will improve 06/02/2024 1724 by Lum Look, RN Outcome: Progressing 06/02/2024 1724 by Lum Look, RN Outcome: Progressing   Problem: Education: Goal: Knowledge of disease or condition will improve 06/02/2024 1724 by Lum Look, RN Outcome: Progressing 06/02/2024 1724 by Lum Look, RN Outcome: Progressing Goal: Knowledge of secondary prevention will improve (MUST DOCUMENT ALL) 06/02/2024 1724 by Lum Look, RN Outcome: Progressing 06/02/2024 1724 by Lum Look, RN Outcome: Progressing Goal: Knowledge of patient specific risk factors will improve (DELETE if not current risk factor) 06/02/2024 1724 by Lum Look, RN Outcome: Progressing 06/02/2024 1724 by Lum Look, RN Outcome: Progressing   Problem: Ischemic Stroke/TIA Tissue Perfusion: Goal: Complications of ischemic stroke/TIA will be minimized 06/02/2024 1724 by Lum Look, RN Outcome: Progressing 06/02/2024 1724 by Lum Look, RN Outcome: Progressing   Problem: Coping: Goal: Will verbalize positive feelings about self 06/02/2024 1724 by Lum Look, RN Outcome: Progressing 06/02/2024 1724 by Lum Look, RN Outcome: Progressing Goal: Will identify appropriate support needs 06/02/2024 1724 by Lum Look, RN Outcome: Progressing 06/02/2024 1724 by Lum Look, RN Outcome: Progressing   Problem: Health Behavior/Discharge Planning: Goal: Ability to manage health-related needs will improve 06/02/2024 1724 by Lum Look, RN Outcome: Progressing 06/02/2024 1724 by Lum Look, RN Outcome: Progressing Goal: Goals will be collaboratively established with patient/family 06/02/2024 1724 by Lum Look,  RN Outcome: Progressing 06/02/2024 1724 by Lum Look, RN Outcome: Progressing   Problem: Self-Care: Goal: Ability to participate in self-care as condition permits will improve 06/02/2024 1724 by Lum Look, RN Outcome: Progressing 06/02/2024 1724 by Lum Look, RN Outcome: Progressing Goal: Verbalization of feelings and concerns over difficulty with self-care will improve 06/02/2024 1724 by Lum Look, RN Outcome: Progressing 06/02/2024 1724 by Lum Look, RN Outcome: Progressing Goal: Ability to communicate  needs accurately will improve 06/02/2024 1724 by Lum Look, RN Outcome: Progressing 06/02/2024 1724 by Lum Look, RN Outcome: Progressing   Problem: Nutrition: Goal: Risk of aspiration will decrease 06/02/2024 1724 by Lum Look, RN Outcome: Progressing 06/02/2024 1724 by Lum Look, RN Outcome: Progressing Goal: Dietary intake will improve 06/02/2024 1724 by Lum Look, RN Outcome: Progressing 06/02/2024 1724 by Lum Look, RN Outcome: Progressing   Problem: Education: Goal: Knowledge of General Education information will improve Description: Including pain rating scale, medication(s)/side effects and non-pharmacologic comfort measures 06/02/2024 1724 by Lum Look, RN Outcome: Progressing 06/02/2024 1724 by Lum Look, RN Outcome: Progressing   Problem: Health Behavior/Discharge Planning: Goal: Ability to manage health-related needs will improve 06/02/2024 1724 by Lum Look, RN Outcome: Progressing 06/02/2024 1724 by Lum Look, RN Outcome: Progressing   Problem: Clinical Measurements: Goal: Ability to maintain clinical measurements within normal limits will improve 06/02/2024 1724 by Lum Look, RN Outcome: Progressing 06/02/2024 1724 by Lum Look, RN Outcome: Progressing Goal: Will remain free from infection 06/02/2024 1724 by Lum Look, RN Outcome: Progressing 06/02/2024  1724 by Lum Look, RN Outcome: Progressing Goal: Diagnostic test results will improve 06/02/2024 1724 by Lum Look, RN Outcome: Progressing 06/02/2024 1724 by Lum Look, RN Outcome: Progressing Goal: Respiratory complications will improve 06/02/2024 1724 by Lum Look, RN Outcome: Progressing 06/02/2024 1724 by Lum Look, RN Outcome: Progressing Goal: Cardiovascular complication will be avoided 06/02/2024 1724 by Lum Look, RN Outcome: Progressing 06/02/2024 1724 by Lum Look, RN Outcome: Progressing   Problem: Activity: Goal: Risk for activity intolerance will decrease 06/02/2024 1724 by Lum Look, RN Outcome: Progressing 06/02/2024 1724 by Lum Look, RN Outcome: Progressing   Problem: Nutrition: Goal: Adequate nutrition will be maintained 06/02/2024 1724 by Lum Look, RN Outcome: Progressing 06/02/2024 1724 by Lum Look, RN Outcome: Progressing   Problem: Coping: Goal: Level of anxiety will decrease 06/02/2024 1724 by Lum Look, RN Outcome: Progressing 06/02/2024 1724 by Lum Look, RN Outcome: Progressing   Problem: Elimination: Goal: Will not experience complications related to bowel motility 06/02/2024 1724 by Lum Look, RN Outcome: Progressing 06/02/2024 1724 by Lum Look, RN Outcome: Progressing Goal: Will not experience complications related to urinary retention 06/02/2024 1724 by Lum Look, RN Outcome: Progressing 06/02/2024 1724 by Lum Look, RN Outcome: Progressing   Problem: Pain Managment: Goal: General experience of comfort will improve and/or be controlled 06/02/2024 1724 by Lum Look, RN Outcome: Progressing 06/02/2024 1724 by Lum Look, RN Outcome: Progressing   Problem: Safety: Goal: Ability to remain free from injury will improve 06/02/2024 1724 by Lum Look, RN Outcome: Progressing 06/02/2024 1724 by Lum Look, RN Outcome:  Progressing   Problem: Skin Integrity: Goal: Risk for impaired skin integrity will decrease 06/02/2024 1724 by Lum Look, RN Outcome: Progressing 06/02/2024 1724 by Lum Look, RN Outcome: Progressing

## 2024-06-02 NOTE — Progress Notes (Signed)
 BLE venous duplex has been completed.  Preliminary findings given to Dr. Jerri.   Results can be found under chart review under CV PROC. 06/02/2024 4:07 PM Bassel Gaskill RVT, RDMS

## 2024-06-02 NOTE — Plan of Care (Signed)

## 2024-06-02 NOTE — Progress Notes (Signed)
 STROKE TEAM PROGRESS NOTE   SUBJECTIVE (INTERVAL HISTORY) She is walking with PT and OT in the hallway. Overall her condition is stable. She is on eliquis and tolerating well.    OBJECTIVE Temp:  [97.5 F (36.4 C)-98.2 F (36.8 C)] 98.2 F (36.8 C) (09/30 1116) Pulse Rate:  [58-69] 69 (09/30 1116) Cardiac Rhythm: Sinus bradycardia (09/30 0800) Resp:  [18-20] 19 (09/30 1116) BP: (102-116)/(40-74) 107/44 (09/30 1116) SpO2:  [98 %-100 %] 99 % (09/30 1116)  Recent Labs  Lab 06/01/24 0635 06/01/24 1154 06/01/24 1536 06/01/24 2118 06/02/24 0611  GLUCAP 167* 238* 239* 243* 244*   Recent Labs  Lab 05/28/24 1427 05/30/24 2350 05/31/24 0011 06/01/24 0518  NA 138 133* 134* 133*  K 3.8 4.1 4.0 4.3  CL 102 98 97* 101  CO2 31 23  --  26  GLUCOSE 153* 280* 287* 167*  BUN 8 8 7* 7*  CREATININE 0.53 0.77 0.80 0.68  CALCIUM  8.6* 8.8*  --  8.5*   Recent Labs  Lab 05/28/24 1427 05/30/24 2350  AST 11* 18  ALT 6 7  ALKPHOS 90 90  BILITOT 0.3 0.3  PROT 6.5 5.8*  ALBUMIN 3.3* 3.1*   Recent Labs  Lab 05/28/24 1427 05/30/24 2350 05/31/24 0011 06/01/24 0518  WBC 8.8 7.1  --  6.2  NEUTROABS 6.8 5.3  --   --   HGB 8.8* 8.1* 8.5* 8.4*  HCT 27.8* 26.9* 25.0* 27.2*  MCV 86.3 90.6  --  90.1  PLT 215 193  --  217   No results for input(s): CKTOTAL, CKMB, CKMBINDEX, TROPONINI in the last 168 hours. No results for input(s): LABPROT, INR in the last 72 hours. Recent Labs    05/31/24 0630  COLORURINE YELLOW  LABSPEC 1.016  PHURINE 5.0  GLUCOSEU 150*  HGBUR NEGATIVE  BILIRUBINUR NEGATIVE  KETONESUR NEGATIVE  PROTEINUR NEGATIVE  NITRITE NEGATIVE  LEUKOCYTESUR NEGATIVE       Component Value Date/Time   CHOL 100 05/30/2024 2350   CHOL 108 01/25/2023 0945   TRIG 170 (H) 05/30/2024 2350   HDL 38 (L) 05/30/2024 2350   HDL 45 01/25/2023 0945   CHOLHDL 2.6 05/30/2024 2350   VLDL 34 05/30/2024 2350   LDLCALC 28 05/30/2024 2350   LDLCALC 40 01/25/2023 0945    Lab Results  Component Value Date   HGBA1C 7.2 (H) 06/01/2024      Component Value Date/Time   LABOPIA POSITIVE (A) 04/20/2024 1817   COCAINSCRNUR NONE DETECTED 04/20/2024 1817   LABBENZ NONE DETECTED 04/20/2024 1817   AMPHETMU NONE DETECTED 04/20/2024 1817   THCU NONE DETECTED 04/20/2024 1817   LABBARB NONE DETECTED 04/20/2024 1817    No results for input(s): ETH in the last 168 hours.  I have personally reviewed the radiological images below and agree with the radiology interpretations.  ECHOCARDIOGRAM COMPLETE Result Date: 06/01/2024    ECHOCARDIOGRAM REPORT   Patient Name:   OMOLOLA MITTMAN Densmore Date of Exam: 06/01/2024 Medical Rec #:  [id number]           Height:       62.0 in Accession #:    [id number]          Weight:       187.4 lb Date of Birth:  1961/02/07           BSA:          1.860 m Patient Age:    63 years  BP:           101/63 mmHg Patient Gender: F                   HR:           56 bpm. Exam Location:  Inpatient Procedure: 2D Echo, Cardiac Doppler, Color Doppler and Saline Contrast Bubble            Study (Both Spectral and Color Flow Doppler were utilized during            procedure). Indications:    Stroke  History:        Patient has prior history of Echocardiogram examinations, most                 recent 04/21/2024. CHF, CAD, Signs/Symptoms:Dyspnea; Risk                 Factors:Hypertension, Dyslipidemia, Sleep Apnea and Diabetes.  Sonographer:    Philomena Daring Referring Phys: ERIC J UZ[place]AN IMPRESSIONS  1. Left ventricular ejection fraction, by estimation, is 55 to 60%. The left ventricle has normal function. The left ventricle has no regional wall motion abnormalities. Left ventricular diastolic parameters are consistent with Grade II diastolic dysfunction (pseudonormalization).  2. Right ventricular systolic function is low normal. The right ventricular size is normal.  3. The mitral valve is normal in structure. No evidence of mitral valve regurgitation. No  evidence of mitral stenosis.  4. The aortic valve is calcified. There is moderate calcification of the aortic valve. There is moderate thickening of the aortic valve. Aortic valve regurgitation is not visualized. Mild aortic valve stenosis. Aortic valve area, by VTI measures 1.47 cm. Aortic valve mean gradient measures 15.0 mmHg. Aortic valve Vmax measures 2.71 m/s.  5. The inferior vena cava is normal in size with greater than 50% respiratory variability, suggesting right atrial pressure of 3 mmHg. FINDINGS  Left Ventricle: Left ventricular ejection fraction, by estimation, is 55 to 60%. The left ventricle has normal function. The left ventricle has no regional wall motion abnormalities. The left ventricular internal cavity size was normal in size. There is  no left ventricular hypertrophy. Left ventricular diastolic parameters are consistent with Grade II diastolic dysfunction (pseudonormalization). Right Ventricle: The right ventricular size is normal. No increase in right ventricular wall thickness. Right ventricular systolic function is low normal. Left Atrium: Left atrial size was normal in size. Right Atrium: Right atrial size was normal in size. Pericardium: There is no evidence of pericardial effusion. Mitral Valve: The mitral valve is normal in structure. No evidence of mitral valve regurgitation. No evidence of mitral valve stenosis. Tricuspid Valve: The tricuspid valve is normal in structure. Tricuspid valve regurgitation is not demonstrated. No evidence of tricuspid stenosis. Aortic Valve: The aortic valve is calcified. There is moderate calcification of the aortic valve. There is moderate thickening of the aortic valve. Aortic valve regurgitation is not visualized. Mild aortic stenosis is present. Aortic valve mean gradient measures 15.0 mmHg. Aortic valve peak gradient measures 29.4 mmHg. Aortic valve area, by VTI measures 1.47 cm. Pulmonic Valve: The pulmonic valve was normal in structure. Pulmonic  valve regurgitation is not visualized. No evidence of pulmonic stenosis. Aorta: The aortic root is normal in size and structure. Venous: The inferior vena cava is normal in size with greater than 50% respiratory variability, suggesting right atrial pressure of 3 mmHg. IAS/Shunts: No atrial level shunt detected by color flow Doppler. Agitated saline contrast was given intravenously to evaluate  for intracardiac shunting.  LEFT VENTRICLE PLAX 2D LVIDd:         5.20 cm   Diastology LVIDs:         3.60 cm   LV e' medial:    6.20 cm/s LV PW:         1.20 cm   LV E/e' medial:  19.5 LV IVS:        1.00 cm   LV e' lateral:   6.42 cm/s LVOT diam:     2.00 cm   LV E/e' lateral: 18.8 LV SV:         90 LV SV Index:   48 LVOT Area:     3.14 cm  RIGHT VENTRICLE            IVC RV S prime:     8.05 cm/s  IVC diam: 1.80 cm TAPSE (M-mode): 1.5 cm LEFT ATRIUM             Index        RIGHT ATRIUM           Index LA diam:        3.80 cm 2.04 cm/m   RA Area:     12.20 cm LA Vol (A2C):   52.0 ml 27.96 ml/m  RA Volume:   23.10 ml  12.42 ml/m LA Vol (A4C):   50.1 ml 26.94 ml/m LA Biplane Vol: 52.2 ml 28.07 ml/m  AORTIC VALVE AV Area (Vmax):    1.36 cm AV Area (Vmean):   1.36 cm AV Area (VTI):     1.47 cm AV Vmax:           271.00 cm/s AV Vmean:          179.250 cm/s AV VTI:            0.612 m AV Peak Grad:      29.4 mmHg AV Mean Grad:      15.0 mmHg LVOT Vmax:         117.00 cm/s LVOT Vmean:        77.600 cm/s LVOT VTI:          0.286 m LVOT/AV VTI ratio: 0.47  AORTA Ao Root diam: 2.80 cm Ao Asc diam:  3.50 cm MITRAL VALVE MV Area (PHT): 2.14 cm     SHUNTS MV Decel Time: 355 msec     Systemic VTI:  0.29 m MV E velocity: 121.00 cm/s  Systemic Diam: 2.00 cm MV A velocity: 123.00 cm/s MV E/A ratio:  0.98 Morene Brownie Electronically signed by Morene Brownie Signature Date/Time: 06/01/2024/9:38:18 PM    Final    EEG adult Result Date: 06/01/2024 Matthews Elida HERO, MD     06/01/2024  7:53 AM Routine EEG Report Seletha Zimmermann  is a 63 y.o. female with a history of altered mental status who is undergoing an EEG to evaluate for seizures. Report: This EEG was acquired with electrodes placed according to the International 10-20 electrode system (including Fp1, Fp2, F3, F4, C3, C4, P3, P4, O1, O2, T3, T4, T5, T6, A1, A2, Fz, Cz, Pz). The following electrodes were missing or displaced: none. The occipital dominant rhythm was 6-7 Hz. This activity is reactive to stimulation. Drowsiness was manifested by background fragmentation; deeper stages of sleep were not identified. There was no focal slowing. There were no interictal epileptiform discharges. There were no electrographic seizures identified. Photic stimulation and hyperventilation were not performed. Impression and clinical correlation: This EEG was obtained while  awake and drowsy and is abnormal due to mild diffuse slowing indicative of global cerebral dysfunction. Epileptiform abnormalities were not seen during this recording. [name], MD [facility] [phone] If 7pm- 7am, please page neurology on call as listed in AMION.   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 Confusion, weakness, neurologic deficit. EXAM: MRI HEAD WITHOUT AND WITH CONTRAST TECHNIQUE: Multiplanar, multiecho pulse sequences of the brain and surrounding structures were obtained without and with intravenous contrast. CONTRAST:  8mL GADAVIST  GADOBUTROL  1 MMOL/ML IV SOLN COMPARISON:  Head CT without contrast 0101 hours today. Brain MRI 04/21/2024. FINDINGS: Brain: Confluent restricted diffusion in the right PCA territory from the posterior mesial temporal lobe, patchy right occipital lobe involvement, periatrial white matter involvement and marginal involvement of the splenium of the corpus callosum. Additional numerous small foci of restricted diffusion scattered in the bilateral cerebellum and cerebral hemispheres.  Anterior and posterior circulation affected bilaterally. Deep gray nuclei and brainstem are relatively spared. Cytotoxic edema in the affected areas. No intracranial hemorrhage identified. Luxury perfusion enhancement appearance in the right PCA territory (series 17, image 13). No masslike or suspicious postcontrast enhancement. No dural thickening. No midline shift, ventriculomegaly, evidence of discrete intracranial mass. Cervicomedullary junction and pituitary are within normal limits. No abnormal enhancement identified. Evidence of mass lesion, ventriculomegaly, extra-axial collection or acute intracranial hemorrhage. Cervicomedullary junction and pituitary are within normal limits. Vascular: Major intracranial vascular flow voids are preserved. Skull and upper cervical spine: Visualized bone marrow signal is within normal limits. Negative visible cervical spine and spinal cord. Sinuses/Orbits: Negative. Other: Stable mild mastoid effusion since last month. IMPRESSION: 1. Numerous small acute embolic infarcts in the bilateral anterior and posterior circulation, with superimposed confluent Right PCA territory infarct corresponding to that seen on CT this morning. 2. Cytotoxic edema with no intracranial hemorrhage or mass effect. 3. No intracranial metastatic disease identified. Electronically Signed   By: VEAR Hurst M.D.   On: 05/31/2024 10:49   CT Head Wo Contrast Result Date: 05/31/2024 CLINICAL DATA:  Confusion, weakness, mental status changes EXAM: CT HEAD WITHOUT CONTRAST TECHNIQUE: Contiguous axial images were obtained from the base of the skull through the vertex without intravenous contrast. RADIATION DOSE REDUCTION: This exam was performed according to the departmental dose-optimization program which includes automated exposure control, adjustment of the mA and/or kV according to patient size and/or use of iterative reconstruction technique. COMPARISON:  04/20/2024 FINDINGS: Brain: Area of low-density  noted in the right posterior temporal and occipital lobes adjacent to the right lateral ventricle, new since prior study concerning for acute to subacute infarcts. No hemorrhage or hydrocephalus. No mass effect or midline shift. Vascular: No hyperdense vessel or unexpected calcification. Skull: No acute calvarial abnormality. Sinuses/Orbits: No acute findings Other: None IMPRESSION: New low-density areas in the posterior right temporal lobe and occipital lobe concerning for acute to subacute infarcts. These are new since prior study. Electronically Signed   By: Franky Crease M.D.   On: 05/31/2024 01:20   DG Chest Port 1 View Result Date: 05/31/2024 CLINICAL DATA:  Altered mental status EXAM: PORTABLE CHEST 1 VIEW COMPARISON:  05/21/2024 FINDINGS: Stable chronic elevation of the right hemidiaphragm. Right Port-A-Cath remains in place, unchanged. Cardiomediastinal contours are unchanged. No confluent airspace opacities, effusions or edema. No acute bony abnormality. IMPRESSION: No active disease. Electronically Signed   By: Franky Crease M.D.   On: 05/31/2024 00:20   DG CHEST PORT 1 VIEW Result  Date: 05/21/2024 CLINICAL DATA:  10031 Cough 10031 EXAM: PORTABLE CHEST 1 VIEW COMPARISON:  Chest x-ray 05/04/2024, CT abdomen pelvis 05/04/2024 FINDINGS: Right chest wall accessed Port-A-Cath with tip overlying the expected region of the superior collection. The heart and mediastinal contours are unchanged. Persistent elevation of the right hemidiaphragm. No focal consolidation. No pulmonary edema. No pleural effusion. No pneumothorax. No acute osseous abnormality. IMPRESSION: No active disease. Electronically Signed   By: Morgane  Naveau M.D.   On: 05/21/2024 23:50   CT ABDOMEN PELVIS W CONTRAST Result Date: 05/04/2024 CLINICAL DATA:  63 year old female with abdominal pain. Metastatic endometrial cancer. * Tracking Code: BO * EXAM: CT ABDOMEN AND PELVIS WITH CONTRAST TECHNIQUE: Multidetector CT imaging of the abdomen  and pelvis was performed using the standard protocol following bolus administration of intravenous contrast. RADIATION DOSE REDUCTION: This exam was performed according to the departmental dose-optimization program which includes automated exposure control, adjustment of the mA and/or kV according to patient size and/or use of iterative reconstruction technique. CONTRAST:  OMNIPAQUE  IOHEXOL  300 MG/ML  SOLN COMPARISON:  CT Abdomen and Pelvis 03/29/2024. FINDINGS: Lower chest: Stable elevation of the right hemidiaphragm. Borderline to mild cardiomegaly is stable. No pericardial effusion. Trace layering left pleural effusion is new since July. And there is confluent new posterior basal segment left lower lobe peribronchial airspace opacity with air bronchograms (series 6, image 51). The visible airways remain patent. No suspicious lung base nodule or discrete mass. Hepatobiliary: Chronically absent gallbladder and stable prominence of intra- and extrahepatic biliary ducts since July. No discrete liver mass. Pancreas: Stable atrophy. Spleen: Stable and negative. Adrenals/Urinary Tract: Mass effect on the left kidney from bulky, which left abdominal retroperitoneal, paraspinal, pararenal space mass is further detailed below. Invasion of the left pararenal space as before. Involvement of the left main renal vessels also (series 2, image 38). But renal enhancement remain symmetric. Renal contrast excretion is symmetric. The left ureter is located along the anterior margin of the large tumor and is nondilated. Adrenal glands remain within normal limits. Bladder is negative. Stomach/Bowel: Nondilated large and small bowel. Redundant sigmoid colon. Mild large bowel retained stool. Cecum partially located in the pelvis. Decompressed terminal ileum. Normal appendix on series 2, image 73. Small fat containing ventral abdominal hernia near the midline on series 2, image 67 is stable. No herniated bowel. Stomach and duodenum  appear negative. No pneumoperitoneum. No free fluid. Vascular/Lymphatic: Large and heterogeneously enhancing nearly 10 cm left abdominal retroperitoneal, paraspinal, pararenal space mass. This may be severe ex nodal tumor, and has progressed since July which is further detailed below. That tumor is inseparable from the infrarenal abdominal aorta which remains patent on series 2, image 44. Tumor extends to the aortoiliac bifurcation which is patent. Underlying mild for age Aortoiliac calcified atherosclerosis. More advanced calcified femoral artery atherosclerosis. Major arterial branches remain patent. Portal venous system and IVC also appear patent. Left external iliac lymphadenopathy is stable measuring up to 18 mm short axis. No other lymphadenopathy. Reproductive: Surgically absent. Other: No pelvis free fluid. 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 But subtle if any interval erosion into the adjacent L2 and L3 vertebral bodies (perhaps on series 2, image 41). No discrete bone metastasis by CT. Body wall edema not significantly changed since July. IMPRESSION: 1.  Very large and progressed since July Left abdominal tumor occupying and invading the lumbar paraspinal, retroperitoneal, para-aortic, and pararenal spaces. Tumor is now up to 9.5 cm transaxial, directly involves the left renal vessels and abuts the aorta - which remain patent. Early if any invasion into the adjacent L2 and L3 lumbar vertebrae. 2. Confluent new Left lower lobe posterior basal segment peribronchial consolidation, more suspicious for Pneumonia or Aspiration than metastatic disease. Small new layering left pleural effusion. 3. Stable left external iliac lymphadenopathy. No brand new metastasis is identified in the  abdomen or pelvis. Electronically Signed   By: VEAR Hurst M.D.   On: 05/04/2024 04:04   DG Chest Port 1 View Result Date: 05/04/2024 CLINICAL DATA:  Dyspnea EXAM: PORTABLE CHEST 1 VIEW COMPARISON:  04/20/2024 FINDINGS: Stable elevation of the right hemidiaphragm. Lungs are clear. No pneumothorax or pleural effusion. Right internal jugular chest port tip seen within the superior cavoatrial junction. Cardiac size within limits. Pulmonary vascularity is normal. No acute bone abnormality. IMPRESSION: 1. No active disease. Electronically Signed   By: Dorethia Molt M.D.   On: 05/04/2024 02:47     PHYSICAL EXAM  Temp:  [97.5 F (36.4 C)-98.2 F (36.8 C)] 98.2 F (36.8 C) (09/30 1116) Pulse Rate:  [58-69] 69 (09/30 1116) Resp:  [18-20] 19 (09/30 1116) BP: (102-116)/(40-74) 107/44 (09/30 1116) SpO2:  [98 %-100 %] 99 % (09/30 1116)  General - Well nourished, well developed, in no apparent distress.  Ophthalmologic - fundi not visualized due to noncooperation.  Cardiovascular - Regular rhythm and rate.  Neuro - awake, alert, eyes open, orientated to place, age, month and people but not year. No aphasia, but paucity of speech, following all simple commands with mild psychomotor slowing. Able to name 3/4 and repeat simple sentences. No gaze palsy, tracking bilaterally, visual field full however patient has diabetic retinopathy with tunnel vision, PERRL. No significant facial droop. Tongue midline. Bilateral UEs 4/5, no drift. Bilaterally LEs 4/5, no drift. Sensation symmetrical bilaterally, b/l FTN intact, gait not tested.    ASSESSMENT/PLAN Ms. Alaze Garverick is a 63 y.o. female with history of hypertension, hyperlipidemia, stage IV metastatic uterine cancer, diabetes, OSA on CPAP, chronic respiratory failure on home O2, and CAD status post stenting admitted for aphasia. Symptoms has much resolved but MRI showed emoblic infarcts. No TNK given due to outside window.    Stroke:  bilateral anterior  and posterior small infarcts embolic likely secondary to hypercoagulable state from advanced malignancy  CT head concerning for acute to subacute infarct at right temporal lobe and occipital lobe.  CTA head and neck in 04/2024 unremarkable MRI showed numerous small acute embolic infarct in the bilateral anterior and posterior circulations, consistent with embolic source.  2D Echo EF 55-60% LE venous doppler right peroneal vein acute DVT LDL 40 in 04/2024 A1c 9.2 in 04/2024   Lovenox  for VTE prophylaxis Effient  prior to admission, now on eliquis 5mg  bid.  Patient counseled to be compliant with her antithrombotic medications Ongoing aggressive stroke risk factor management Therapy recommendations:  CIR Disposition:  pending  Hx of recent stroke In 04/2024, patient was admitted for lethargy and confusion.  Found to be in DKA.  CT no acute finding.  CT head and neck no LVO.  MRI no acute infarct.  EF 60 to 65%.  LDL 40, A1c 9.2.  Her symptoms are considered as encephalopathy in the setting of DKA. She was continued on Effient  and Lipitor  80.   Acute DVT LE  venous doppler right peroneal vein acute DVT Likely related to hypercoagulable state TCD bubble study not performed given not change management On Eliquis now  CAD/NSTEMI NSTEMI in 2000 status post stenting, again had a stenting in 10/2019.   Allergic to Plavix, on Effient  5 daily and Lipitor  80 PTA.  Discussed with cardiology, OK with eliquis alone  Uterine cancer follows with Dr. Lonn for metastatic uterine cancer which diagnosed in 2023 status post surgery and radiation treatment.   Found to have recurrent cancer in 03/2024 with severe back pain and CT showed recurrent disease in the retroperitoneal lymph node.   CT of chest showed lung mass suspicious for metastatic lesion.   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  Concerning for hypercoagulable state now - will put  on eliquis  Diabetes HgbA1c 9.2 in 04/2024 goal < 7.0 Uncontrolled Currently on lantus   CBG monitoring SSI DM education and close PCP follow up  Hypertension Stable Long term BP goal normotensive  Hyperlipidemia Home meds:  lipitor  80  LDL 40 in 04/2024, goal < 70 Now on lipitor  80 Continue statin at discharge  Other Stroke Risk Factors Obesity, Body mass index is 34.27 kg/m.  Obstructive sleep apnea, on CPAP at home  Other Active Problems chronic respiratory failure on home O2  Hospital day # 2  Neurology will sign off. Please call with questions. Pt will follow up with stroke clinic NP at [facility] in about 4 weeks. Thanks for the consult.   Ary Cummins, MD PhD Stroke Neurology 06/02/2024 11:30 AM    To contact Stroke Continuity provider, please refer to [email or web address]. After hours, contact General Neurology

## 2024-06-02 NOTE — PMR Pre-admission (Signed)
 PMR Admission Coordinator Pre-Admission Assessment  Patient: Brenda [name] is an 63 y.o., female MRN: [id number] DOB: 08-04-1961 Height: 5' 2 (157.5 cm) Weight: 85 kg  Insurance Information HMO: ***    PPO: ***     PCP:      IPA:      80/20:      OTHER:  PRIMARY: [place] Medicaid Healthy Blue      Policy#: ***      Subscriber: *** CM Name: ***      Phone#: ***     Fax#: *** Pre-Cert#: ***      Employer: *** Benefits:  Phone #: ***     Name: *** Eff. Date: ***     Deduct: ***      Out of Pocket Max: ***      Life Max: *** CIR: ***      SNF: *** Outpatient: ***     Co-Pay: *** Home Health: ***      Co-Pay: *** DME: ***     Co-Pay: *** Providers: *** SECONDARY:       Policy#:      Phone#:   Financial Counselor:       Phone#:   The "Data Collection Information Summary" for patients in Inpatient Rehabilitation Facilities with attached "Privacy Act Statement-Health Care Records" was provided and verbally reviewed with: Patient and Family  Emergency Contact Information Contact Information     Name Relation Home Work Mobile   Mckenna,Matthew Spouse [phone]  [phone]   White,[name] Daughter [phone]  [phone]      Other Contacts     Name Relation Home Work Mobile   Napier,Marlis Sister  [phone]        Current Medical History  Patient Admitting Diagnosis: CVA   History of Present Illness: Pt is a 63 y/o female with PMH of HTN, stage IV metastatic uterine cancer, DM, OSA on CPAP and chronic O2 who was admitted 05/31/24 with word finding deficits.  CT concerning for acute/subacute infarct in R temporal and occipital lobe and MRI confirmed numerous small acute embolic infarcts in the bilateral anterior posterior circulations.  Neurology switched aspirin  to eliquis feeling that CVA was related to hypercoagulable state from metastatic cancer.  Per Dr. Mellie, cancelled keytruda  on 9/30 and reschedule at a later date.  She has had some encephalopathy which is  resolving with reducing opiate use.  Therapy ongoing and recommendations are for CIR>   Complete NIHSS TOTAL: 1  Patient's medical record from [facility] has been reviewed by the rehabilitation admission coordinator and physician.  Past Medical History  Past Medical History:  Diagnosis Date   Anemia    Arthritis    back, hands, hips (02/22/2015)   CAD (coronary artery disease)    a. complex LAD/diagonal bifurcation PCI in 2010. b. STEMI 10/2019 s/p PTCA/DES x1 to mLAD overlapping the old stent, residual disease treated medicaly.   Cancer [facility])    uterine   CHF (congestive heart failure) (HCC)    Depression    Diabetes mellitus (HCC)    started when I was pregnant; not sure if it was type 1 or type 2    History of radiation therapy    Endometrium- HDR 01/22/22-03/07/22- Dr. Lynwood Nasuti   Hypercholesterolemia    Hypertension    Morbid obesity (HCC)    Myocardial infarction (HCC)    mild x 3   Neuromuscular disorder (HCC)    neuropathy feet   Sleep apnea    cpap/  Has the patient had major surgery during 100 days prior to admission? No  Family History   family history includes COPD in her father; Cancer in her mother; Coronary artery disease in her mother and sister; Diabetes in her sister; Heart attack in her father; Hypertension in her mother; Other in her half-sister; Stroke in her sister; Thyroid  cancer in her daughter.  Current Medications  Current Facility-Administered Medications:    apixaban (ELIQUIS) tablet 5 mg, 5 mg, Oral, BID, Paytes, Austin A, RPH, 5 mg at 06/02/24 0901   atorvastatin  (LIPITOR ) tablet 80 mg, 80 mg, Oral, QPM, Segars, Dorn, MD, 80 mg at 06/01/24 1701   calcium  carbonate (TUMS - dosed in mg elemental calcium ) chewable tablet 200 mg of elemental calcium , 1 tablet, Oral, QID PRN, [place], Camellia PARAS, DO, 200 mg of elemental calcium  at 05/31/24 1251   Chlorhexidine  Gluconate Cloth 2 % PADS 6 each, 6 each, Topical, Daily, Lonn, Ni, MD, 6 each  at 06/02/24 9094   FLUoxetine  (PROZAC ) capsule 40 mg, 40 mg, Oral, Daily, Segars, Dorn, MD, 40 mg at 06/02/24 9141   insulin  aspart (novoLOG ) injection 0-5 Units, 0-5 Units, Subcutaneous, QHS, Segars, Jonathan, MD, 2 Units at 06/01/24 2154   insulin  aspart (novoLOG ) injection 0-9 Units, 0-9 Units, Subcutaneous, TID WC, Segars, Dorn, MD, 5 Units at 06/02/24 1208   insulin  aspart (novoLOG ) injection 3 Units, 3 Units, Subcutaneous, TID WC, Samtani, Jai-Gurmukh, MD   insulin  glargine (LANTUS ) injection 24 Units, 24 Units, Subcutaneous, Q0600, Paytes, Austin A, RPH   metFORMIN (GLUCOPHAGE-XR) 24 hr tablet 1,000 mg, 1,000 mg, Oral, BID WC, Samtani, Jai-Gurmukh, MD, 1,000 mg at 06/02/24 0900   ondansetron  (ZOFRAN ) injection 4 mg, 4 mg, Intravenous, Q6H PRN, [place], Camellia PARAS, DO, 4 mg at 05/31/24 1251   oxyCODONE  (Oxy IR/ROXICODONE ) immediate release tablet 10 mg, 10 mg, Oral, Q6H PRN, Segars, Jonathan, MD, 10 mg at 06/02/24 9140   pregabalin  (LYRICA ) capsule 50 mg, 50 mg, Oral, TID, Segars, Dorn, MD, 50 mg at 06/02/24 0901   sodium chloride  flush (NS) 0.9 % injection 10-40 mL, 10-40 mL, Intracatheter, Q12H, Gorsuch, Ni, MD, 10 mL at 06/02/24 9094   sodium chloride  flush (NS) 0.9 % injection 10-40 mL, 10-40 mL, Intracatheter, PRN, Lonn, Ni, MD  Patients Current Diet:  Diet Order             Diet Heart Room service appropriate? Yes; Fluid consistency: Thin  Diet effective now                   Precautions / Restrictions Precautions Precautions: Fall Restrictions Weight Bearing Restrictions Per Provider Order: No   Has the patient had 2 or more falls or a fall with injury in the past year? Yes  Prior Activity Level Limited Community (1-2x/wk): rollator for ambulation, driving short distances since august, transport for chair for full community access  Prior Functional Level Self Care: Did the patient need help bathing, dressing, using the toilet or eating?  Independent  Indoor Mobility: Did the patient need assistance with walking from room to room (with or without device)? Independent  Stairs: Did the patient need assistance with internal or external stairs (with or without device)? Independent  Functional Cognition: Did the patient need help planning regular tasks such as shopping or remembering to take medications? Independent  Patient Information Are you of Hispanic, Latino/a,or Spanish origin?: A. No, not of Hispanic, Latino/a, or Spanish origin What is your race?: A. White Do you need or want an interpreter  to communicate with a doctor or health care staff?: 0. No  Patient's Response To:  Health Literacy and Transportation Is the patient able to respond to health literacy and transportation needs?: Yes Health Literacy - How often do you need to have someone help you when you read instructions, pamphlets, or other written material from your doctor or pharmacy?: Never In the past 12 months, has lack of transportation kept you from medical appointments or from getting medications?: No In the past 12 months, has lack of transportation kept you from meetings, work, or from getting things needed for daily living?: No  Journalist, newspaper / Equipment Home Equipment: Rollator (4 wheels), BSC/3in1, Shower seat, Grab bars - toilet, Grab bars - tub/shower (transport chair)  Prior Device Use: Indicate devices/aids used by the patient prior to current illness, exacerbation or injury? Walker  Current Functional Level Cognition  Arousal/Alertness: Awake/alert Overall Cognitive Status: Impaired/Different from baseline Orientation Level: Oriented to person, Oriented to place, Oriented to situation, Disoriented to time Attention: Sustained Sustained Attention: Appears intact Memory: Impaired Memory Impairment: Retrieval deficit Awareness: Impaired Awareness Impairment: Emergent impairment Problem Solving: Impaired Problem Solving Impairment:  Verbal basic, Functional basic Executive Function: Reasoning Reasoning: Appears intact    Extremity Assessment (includes Sensation/Coordination)  Upper Extremity Assessment: Generalized weakness (MMT not performed 2/2 metastatic uterine CA)  Lower Extremity Assessment: Defer to PT evaluation RLE Deficits / Details: Strength 5/5 LLE Deficits / Details: Strength 5/5    ADLs  Overall ADL's : Needs assistance/impaired Grooming: Moderate assistance, Cueing for sequencing, Standing, Wash/dry hands, Oral care Grooming Details (indicate cue type and reason): cues for identifying grooming supplies & appropriate items on wall (light switch, paper towel & soap dispensers), pt attempting to turn faucet on by moving lightswitch and touching open outlet Lower Body Dressing: Moderate assistance, Cueing for sequencing, Sitting/lateral leans Lower Body Dressing Details (indicate cue type and reason): adjusting B socks Toilet Transfer: Minimal assistance, Ambulation, Regular Toilet Toilet Transfer Details (indicate cue type and reason): cues to safely approach toilet Functional mobility during ADLs: Minimal assistance, Cueing for safety, Cueing for sequencing    Mobility  Overal bed mobility: Needs Assistance Bed Mobility: Supine to Sit Supine to sit: Supervision General bed mobility comments: Exited to the R side    Transfers  Overall transfer level: Needs assistance Equipment used: Rollator (4 wheels) Transfers: Sit to/from Stand Sit to Stand: Contact guard assist Bed to/from chair/wheelchair/BSC transfer type:: Step pivot Step pivot transfers: Contact guard assist General transfer comment: Stood from bed height & lower toilet height with cues for hand placement. Cued to use grab bars to assist in bathroom.    Ambulation / Gait / Stairs / Wheelchair Mobility  Ambulation/Gait Ambulation/Gait assistance: Contact guard assist, Min assist Gait Distance (Feet): 60 Feet (60, 60) Assistive device:  Rollator (4 wheels) Gait Pattern/deviations: Step-through pattern, Decreased stride length General Gait Details: Pt with slow and steady pace with Rollator, one lateral LOB with turn and pivot towards left, requiring minA to correct. Pt able to verbalize need for seated rest break, but needs assist with  managing Rollator i.e. locking and unlocking brakes and parking Rollator Gait velocity: reduced Gait velocity interpretation: <1.8 ft/sec, indicate of risk for recurrent falls    Posture / Balance Dynamic Sitting Balance Sitting balance - Comments: no LOB seated EOB Balance Overall balance assessment: Needs assistance Sitting-balance support: Feet supported Sitting balance-Leahy Scale: Good Sitting balance - Comments: no LOB seated EOB Standing balance support: Bilateral upper extremity supported, During  functional activity Standing balance-Leahy Scale: Poor Standing balance comment: pt often with truncal sway and nBOS during functional ambulation in room, often reaching for walls/doorframes for support    Special considerations/life events  CPAP, Oxygen  2L, and Diabetic management yes   Previous Home Environment (from acute therapy documentation) Living Arrangements: Children, Spouse/significant other  Lives With: Spouse Available Help at Discharge: Family, Available PRN/intermittently Type of Home: House Home Layout: One level Home Access: Ramped entrance Bathroom Shower/Tub: Armed forces operational officer Accessibility: Yes Home Care Services: No Additional Comments: Pt has intermittently used rollator but does not want to use one; family uses transport w/c for community distances  Discharge Living Setting Plans for Discharge Living Setting: Patient's home, Lives with (comment) (spouse, 3 adult children PRN) Type of Home at Discharge: House Discharge Home Layout: One level Discharge Home Access: Ramped entrance Discharge Bathroom Shower/Tub: Tub/shower  unit Discharge Bathroom Toilet: Standard Discharge Bathroom Accessibility: Yes How Accessible: Accessible via walker Does the patient have any problems obtaining your medications?: No  Social/Family/Support Systems Patient Roles: Spouse Anticipated Caregiver: Genae Strine Anticipated Caregiver's Contact Information: [phone] Ability/Limitations of Caregiver: none stated, can provide physical assist if needed; between spouse and 3 chidlren will have 24/7 Caregiver Availability: 24/7 Discharge Plan Discussed with Primary Caregiver: Yes Is Caregiver In Agreement with Plan?: Yes Does Caregiver/Family have Issues with Lodging/Transportation while Pt is in Rehab?: No  Goals Patient/Family Goal for Rehab: PT/OT/SLP supervision to mod I Expected length of stay: 7-10 days Additional Information: Discharge plan: return to pt's home, spouse and 3 adult children to provide 24/7 support Pt/Family Agrees to Admission and willing to participate: Yes Program Orientation Provided & Reviewed with Pt/Caregiver Including Roles  & Responsibilities: Yes  Decrease burden of Care through IP rehab admission: n/a  Possible need for SNF placement upon discharge:  Not anticipated.  Pt with excellent family support for discharge home with 24/7 care.   Patient Condition: This patient's condition remains as documented in the consult dated 06/02/24, in which the Rehabilitation Physician determined and documented that the patient's condition is appropriate for intensive rehabilitative care in an inpatient rehabilitation facility. Will admit to inpatient rehab pending insurance approval ***.  Preadmission Screen Completed By:  Reche FORBES Lowers, 06/02/2024 3:19 PM ______________________________________________________________________   Discussed status with Dr. PIERRETTE on *** at *** and received approval for admission today.  Admission Coordinator:  Caitlin E Warren, PT, time PIERRETTEPattricia ***    Assessment/Plan: Diagnosis: *** Does the need for close, 24 hr/day Medical supervision in concert with the patient's rehab needs make it unreasonable for this patient to be served in a less intensive setting? {yes_no_potentially:3041433} Co-Morbidities requiring supervision/potential complications: *** Due to {due un:6958565}, does the patient require 24 hr/day rehab nursing? {yes_no_potentially:3041433} Does the patient require coordinated care of a physician, rehab nurse, PT, OT, and SLP to address physical and functional deficits in the context of the above medical diagnosis(es)? {yes_no_potentially:3041433} Addressing deficits in the following areas: {deficits:3041436} Can the patient actively participate in an intensive therapy program of at least 3 hrs of therapy 5 days a week? {yes_no_potentially:3041433} The potential for patient to make measurable gains while on inpatient rehab is {potential:3041437} Anticipated functional outcomes upon discharge from inpatient rehab: {functional outcomes:304600100} PT, {functional outcomes:304600100} OT, {functional outcomes:304600100} SLP Estimated rehab length of stay to reach the above functional goals is: *** Anticipated discharge destination: {anticipated dc setting:21604} 10. Overall Rehab/Functional Prognosis: {potential:3041437}   MD Signature: ***

## 2024-06-02 NOTE — Progress Notes (Signed)
 Inpatient Rehab Coordinator Note:  I met with patient at bedside and spoke to spouse on the phone to discuss CIR recommendations and goals/expectations of CIR stay.  We reviewed 3 hrs/day of therapy, physician follow up, and average length of stay 2 weeks (dependent upon progress) with goals of supervision to mod I.  Both in agreement to pursue CIR.  She will have 24/7 support from her spouse, and 3 adult children at discharge.  We reviewed insurance auth process and I will start that today.   Reche Lowers, PT, DPT Admissions Coordinator [phone] 06/02/24  3:11 PM

## 2024-06-02 NOTE — TOC Initial Note (Signed)
 Transition of Care [facility]) - Initial/Assessment Note    Patient Details  Name: Brenda [name] MRN: [id number] Date of Birth: Aug 14, 1961  [facility] CM/SW Contact:    Andrez JULIANNA George, RN Phone Number: 06/02/2024, 1:10 PM  Clinical Narrative:                 Brenda [name] is a 63 y.o. 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  Pt is from home with her spouse. Spouse says daughters and son can assist to provide 24 hour supervision. She is on oxygen  at home through [employer].  Spouse provides needed transportation.  Pt was managing her meds but spouse says he can take that over.   Current recommendations are for CiR. Awaiting work up. IP Care management following.  Expected Discharge Plan: IP Rehab Facility Barriers to Discharge: Continued Medical Work up   Patient Goals and CMS Choice   CMS Medicare.gov Compare Post Acute Care list provided to:: Patient Choice offered to / list presented to : Patient, Spouse      Expected Discharge Plan and Services   Discharge Planning Services: CM Consult Post Acute Care Choice: IP Rehab Living arrangements for the past 2 months: Single Family Home                                      Prior Living Arrangements/Services Living arrangements for the past 2 months: Single Family Home Lives with:: Spouse Patient language and need for interpreter reviewed:: Yes Do you feel safe going back to the place where you live?: Yes        Care giver support system in place?: Yes (comment) Current home services: DME (rollator/  shower seat/ cane/ CPAP/ oxygen  with [employer] Criminal Activity/Legal Involvement Pertinent to Current Situation/Hospitalization: No - Comment as needed  Activities of Daily Living   ADL Screening (condition at time of admission) Independently performs ADLs?: No Does the patient have a NEW difficulty with bathing/dressing/toileting/self-feeding that is expected to last >3 days?: No Does the patient have a NEW difficulty with getting in/out of bed, walking, or climbing stairs that is expected to last >3 days?: No Does the patient have a NEW difficulty with communication that is expected to last >3 days?: No Is the patient deaf or have difficulty hearing?: No Does the patient have difficulty seeing, even when wearing glasses/contacts?: Yes Does the patient have difficulty concentrating, remembering, or making decisions?: No  Permission Sought/Granted                  Emotional Assessment Appearance:: Appears stated age Attitude/Demeanor/Rapport: Engaged Affect (typically observed): Accepting Orientation: : Oriented to Self, Oriented to Place, Oriented to Situation   Psych Involvement: No (comment)  Admission diagnosis:  Acute ischemic stroke (HCC) [I63.9] Brain lesion [G93.9] Altered mental status, unspecified altered mental status type [R41.82] Cerebrovascular accident (CVA), unspecified mechanism (HCC) [I63.9] Patient Active Problem List   Diagnosis Date Noted   Acute ischemic stroke (HCC) 05/31/2024   Brain lesion 05/31/2024   DKA (diabetic ketoacidosis) (HCC) 05/21/2024  Physical debility 05/12/2024   Metastatic malignant neoplasm (HCC) 05/05/2024   Community acquired pneumonia 05/04/2024   History of CAD (coronary artery disease) 05/04/2024   CAP (community acquired pneumonia) 05/04/2024   Stroke-like symptoms 04/21/2024   Acute focal neurological deficit 04/20/2024   Cancer related pain 04/16/2024   Goals of care, counseling/discussion 04/16/2024   Other  constipation 04/16/2024   Intractable low back pain 03/29/2024   Small bowel obstruction (HCC) 12/09/2021   SBO (small bowel obstruction) (HCC) 12/08/2021   Endometrial carcinoma (HCC) 12/06/2021   Nonspecific chest pain 10/19/2021   Dysfunctional uterine bleeding    Anemia    Chronic hypoxic respiratory failure (HCC) 03/22/2021   (HFpEF) heart failure with preserved ejection fraction (HCC) 02/09/2021   Chronic diastolic CHF (congestive heart failure) (HCC) 02/08/2021   Old MI (myocardial infarction) 10/26/2019   Anxiety with depression 03/28/2016   Type 2 diabetes mellitus with complication, with long-term current use of insulin  (HCC) 02/23/2015   Morbid obesity (HCC) 02/22/2015   Dyspnea 03/11/2013   Hyperlipidemia 05/25/2009   CAD (coronary artery disease) 05/25/2009   Essential hypertension 05/24/2009   Obstructive sleep apnea 05/24/2009   PCP:  Lonn Hicks, MD Pharmacy:   [facility] [address] [place] [address] Phone: [phone] Fax: [phone]  [name] [address] [place] [address] Phone: [phone] Fax: [phone]     Social Drivers of Health (SDOH) Social History: SDOH Screenings   Food Insecurity: No Food Insecurity (05/31/2024)  Recent Concern: Food Insecurity - Food Insecurity Present (04/20/2024)  Housing: Low Risk  (05/31/2024)  Transportation Needs: No Transportation Needs (05/31/2024)  Utilities: Not At Risk (05/31/2024)  Depression (PHQ2-9): Medium Risk (05/07/2024)  Financial Resource Strain: High Risk (02/10/2021)  Social Connections: Unknown (05/04/2024)  Tobacco Use: Medium Risk (05/30/2024)   SDOH Interventions:     Readmission Risk Interventions    05/04/2024   10:00 AM  Readmission Risk Prevention Plan  Transportation Screening Complete  PCP or Specialist Appt within 3-5 Days Complete  HRI or Home Care Consult Complete  Social Work Consult for Recovery  Care Planning/Counseling Complete  Palliative Care Screening Not Applicable  Medication Review Oceanographer) Complete

## 2024-06-03 ENCOUNTER — Other Ambulatory Visit (HOSPITAL_COMMUNITY): Payer: Self-pay

## 2024-06-03 ENCOUNTER — Inpatient Hospital Stay (HOSPITAL_COMMUNITY)
Admission: AD | Admit: 2024-06-03 | Discharge: 2024-06-12 | DRG: 057 | Disposition: A | Discharge status: Home Health | Source: Intra-hospital | Attending: Physical Medicine & Rehabilitation | Admitting: Physical Medicine & Rehabilitation

## 2024-06-03 ENCOUNTER — Other Ambulatory Visit: Payer: Self-pay

## 2024-06-03 ENCOUNTER — Encounter (HOSPITAL_COMMUNITY): Payer: Self-pay | Admitting: Physical Medicine & Rehabilitation

## 2024-06-03 DIAGNOSIS — Z79899 Other long term (current) drug therapy: Secondary | ICD-10-CM | POA: Diagnosis not present

## 2024-06-03 DIAGNOSIS — R531 Weakness: Secondary | ICD-10-CM | POA: Diagnosis present

## 2024-06-03 DIAGNOSIS — D630 Anemia in neoplastic disease: Secondary | ICD-10-CM | POA: Diagnosis present

## 2024-06-03 DIAGNOSIS — D649 Anemia, unspecified: Secondary | ICD-10-CM | POA: Diagnosis present

## 2024-06-03 DIAGNOSIS — Z923 Personal history of irradiation: Secondary | ICD-10-CM

## 2024-06-03 DIAGNOSIS — E7800 Pure hypercholesterolemia, unspecified: Secondary | ICD-10-CM | POA: Diagnosis present

## 2024-06-03 DIAGNOSIS — Z823 Family history of stroke: Secondary | ICD-10-CM

## 2024-06-03 DIAGNOSIS — Z833 Family history of diabetes mellitus: Secondary | ICD-10-CM

## 2024-06-03 DIAGNOSIS — Z9981 Dependence on supplemental oxygen: Secondary | ICD-10-CM | POA: Diagnosis not present

## 2024-06-03 DIAGNOSIS — Z87891 Personal history of nicotine dependence: Secondary | ICD-10-CM | POA: Diagnosis not present

## 2024-06-03 DIAGNOSIS — E1165 Type 2 diabetes mellitus with hyperglycemia: Secondary | ICD-10-CM | POA: Diagnosis present

## 2024-06-03 DIAGNOSIS — E669 Obesity, unspecified: Secondary | ICD-10-CM | POA: Diagnosis present

## 2024-06-03 DIAGNOSIS — R414 Neurologic neglect syndrome: Secondary | ICD-10-CM | POA: Diagnosis present

## 2024-06-03 DIAGNOSIS — Z794 Long term (current) use of insulin: Secondary | ICD-10-CM | POA: Diagnosis not present

## 2024-06-03 DIAGNOSIS — I110 Hypertensive heart disease with heart failure: Secondary | ICD-10-CM | POA: Diagnosis present

## 2024-06-03 DIAGNOSIS — Z7984 Long term (current) use of oral hypoglycemic drugs: Secondary | ICD-10-CM

## 2024-06-03 DIAGNOSIS — I639 Cerebral infarction, unspecified: Principal | ICD-10-CM | POA: Diagnosis present

## 2024-06-03 DIAGNOSIS — C55 Malignant neoplasm of uterus, part unspecified: Secondary | ICD-10-CM | POA: Diagnosis present

## 2024-06-03 DIAGNOSIS — J9611 Chronic respiratory failure with hypoxia: Secondary | ICD-10-CM | POA: Diagnosis present

## 2024-06-03 DIAGNOSIS — I5032 Chronic diastolic (congestive) heart failure: Secondary | ICD-10-CM | POA: Diagnosis present

## 2024-06-03 DIAGNOSIS — E118 Type 2 diabetes mellitus with unspecified complications: Secondary | ICD-10-CM

## 2024-06-03 DIAGNOSIS — C772 Secondary and unspecified malignant neoplasm of intra-abdominal lymph nodes: Secondary | ICD-10-CM | POA: Diagnosis present

## 2024-06-03 DIAGNOSIS — E66811 Obesity, class 1: Secondary | ICD-10-CM | POA: Diagnosis present

## 2024-06-03 DIAGNOSIS — Z825 Family history of asthma and other chronic lower respiratory diseases: Secondary | ICD-10-CM

## 2024-06-03 DIAGNOSIS — E1110 Type 2 diabetes mellitus with ketoacidosis without coma: Secondary | ICD-10-CM | POA: Diagnosis present

## 2024-06-03 DIAGNOSIS — I82451 Acute embolism and thrombosis of right peroneal vein: Secondary | ICD-10-CM

## 2024-06-03 DIAGNOSIS — D6869 Other thrombophilia: Secondary | ICD-10-CM | POA: Diagnosis present

## 2024-06-03 DIAGNOSIS — L304 Erythema intertrigo: Secondary | ICD-10-CM | POA: Diagnosis present

## 2024-06-03 DIAGNOSIS — K5909 Other constipation: Secondary | ICD-10-CM | POA: Diagnosis present

## 2024-06-03 DIAGNOSIS — G894 Chronic pain syndrome: Secondary | ICD-10-CM | POA: Diagnosis present

## 2024-06-03 DIAGNOSIS — I25810 Atherosclerosis of coronary artery bypass graft(s) without angina pectoris: Secondary | ICD-10-CM

## 2024-06-03 DIAGNOSIS — Z8249 Family history of ischemic heart disease and other diseases of the circulatory system: Secondary | ICD-10-CM | POA: Diagnosis not present

## 2024-06-03 DIAGNOSIS — Z90710 Acquired absence of both cervix and uterus: Secondary | ICD-10-CM

## 2024-06-03 DIAGNOSIS — Z8679 Personal history of other diseases of the circulatory system: Secondary | ICD-10-CM

## 2024-06-03 DIAGNOSIS — R0789 Other chest pain: Secondary | ICD-10-CM | POA: Diagnosis present

## 2024-06-03 DIAGNOSIS — G4733 Obstructive sleep apnea (adult) (pediatric): Secondary | ICD-10-CM | POA: Diagnosis present

## 2024-06-03 DIAGNOSIS — F32A Depression, unspecified: Secondary | ICD-10-CM | POA: Diagnosis present

## 2024-06-03 DIAGNOSIS — Z888 Allergy status to other drugs, medicaments and biological substances status: Secondary | ICD-10-CM

## 2024-06-03 DIAGNOSIS — R079 Chest pain, unspecified: Secondary | ICD-10-CM | POA: Diagnosis present

## 2024-06-03 DIAGNOSIS — Z808 Family history of malignant neoplasm of other organs or systems: Secondary | ICD-10-CM

## 2024-06-03 DIAGNOSIS — I69398 Other sequelae of cerebral infarction: Secondary | ICD-10-CM | POA: Diagnosis not present

## 2024-06-03 DIAGNOSIS — I252 Old myocardial infarction: Secondary | ICD-10-CM

## 2024-06-03 DIAGNOSIS — R29818 Other symptoms and signs involving the nervous system: Secondary | ICD-10-CM | POA: Diagnosis present

## 2024-06-03 DIAGNOSIS — I6389 Other cerebral infarction: Secondary | ICD-10-CM | POA: Diagnosis not present

## 2024-06-03 DIAGNOSIS — I6340 Cerebral infarction due to embolism of unspecified cerebral artery: Secondary | ICD-10-CM | POA: Diagnosis not present

## 2024-06-03 DIAGNOSIS — E785 Hyperlipidemia, unspecified: Secondary | ICD-10-CM | POA: Diagnosis present

## 2024-06-03 DIAGNOSIS — Z6834 Body mass index (BMI) 34.0-34.9, adult: Secondary | ICD-10-CM | POA: Diagnosis not present

## 2024-06-03 DIAGNOSIS — G939 Disorder of brain, unspecified: Secondary | ICD-10-CM | POA: Diagnosis present

## 2024-06-03 DIAGNOSIS — Z885 Allergy status to narcotic agent status: Secondary | ICD-10-CM

## 2024-06-03 DIAGNOSIS — C541 Malignant neoplasm of endometrium: Secondary | ICD-10-CM | POA: Diagnosis present

## 2024-06-03 DIAGNOSIS — C799 Secondary malignant neoplasm of unspecified site: Secondary | ICD-10-CM | POA: Diagnosis present

## 2024-06-03 DIAGNOSIS — R296 Repeated falls: Secondary | ICD-10-CM | POA: Diagnosis present

## 2024-06-03 DIAGNOSIS — F4322 Adjustment disorder with anxiety: Secondary | ICD-10-CM | POA: Diagnosis present

## 2024-06-03 DIAGNOSIS — H532 Diplopia: Secondary | ICD-10-CM | POA: Diagnosis present

## 2024-06-03 DIAGNOSIS — I10 Essential (primary) hypertension: Secondary | ICD-10-CM | POA: Diagnosis present

## 2024-06-03 DIAGNOSIS — I2510 Atherosclerotic heart disease of native coronary artery without angina pectoris: Secondary | ICD-10-CM | POA: Diagnosis present

## 2024-06-03 DIAGNOSIS — E1169 Type 2 diabetes mellitus with other specified complication: Secondary | ICD-10-CM

## 2024-06-03 DIAGNOSIS — Z7901 Long term (current) use of anticoagulants: Secondary | ICD-10-CM | POA: Diagnosis not present

## 2024-06-03 DIAGNOSIS — Z955 Presence of coronary angioplasty implant and graft: Secondary | ICD-10-CM

## 2024-06-03 DIAGNOSIS — J189 Pneumonia, unspecified organism: Secondary | ICD-10-CM | POA: Diagnosis present

## 2024-06-03 DIAGNOSIS — F418 Other specified anxiety disorders: Secondary | ICD-10-CM | POA: Diagnosis present

## 2024-06-03 DIAGNOSIS — I5030 Unspecified diastolic (congestive) heart failure: Secondary | ICD-10-CM | POA: Diagnosis present

## 2024-06-03 LAB — GLUCOSE, CAPILLARY
Glucose-Capillary: 128 mg/dL — ABNORMAL HIGH (ref 70–99)
Glucose-Capillary: 188 mg/dL — ABNORMAL HIGH (ref 70–99)
Glucose-Capillary: 231 mg/dL — ABNORMAL HIGH (ref 70–99)
Glucose-Capillary: 301 mg/dL — ABNORMAL HIGH (ref 70–99)

## 2024-06-03 LAB — CBC WITH DIFFERENTIAL/PLATELET
Abs Immature Granulocytes: 0.02 K/uL (ref 0.00–0.07)
Basophils Absolute: 0 K/uL (ref 0.0–0.1)
Basophils Relative: 1 %
Eosinophils Absolute: 0.2 K/uL (ref 0.0–0.5)
Eosinophils Relative: 3 %
HCT: 25.2 % — ABNORMAL LOW (ref 36.0–46.0)
Hemoglobin: 7.8 g/dL — ABNORMAL LOW (ref 12.0–15.0)
Immature Granulocytes: 0 %
Lymphocytes Relative: 26 %
Lymphs Abs: 1.5 K/uL (ref 0.7–4.0)
MCH: 27.7 pg (ref 26.0–34.0)
MCHC: 31 g/dL (ref 30.0–36.0)
MCV: 89.4 fL (ref 80.0–100.0)
Monocytes Absolute: 0.5 K/uL (ref 0.1–1.0)
Monocytes Relative: 9 %
Neutro Abs: 3.7 K/uL (ref 1.7–7.7)
Neutrophils Relative %: 61 %
Platelets: 228 K/uL (ref 150–400)
RBC: 2.82 MIL/uL — ABNORMAL LOW (ref 3.87–5.11)
RDW: 18.8 % — ABNORMAL HIGH (ref 11.5–15.5)
WBC: 5.9 K/uL (ref 4.0–10.5)
nRBC: 0 % (ref 0.0–0.2)

## 2024-06-03 LAB — BASIC METABOLIC PANEL WITH GFR
Anion gap: 10 (ref 5–15)
BUN: 8 mg/dL (ref 8–23)
CO2: 26 mmol/L (ref 22–32)
Calcium: 8.6 mg/dL — ABNORMAL LOW (ref 8.9–10.3)
Chloride: 100 mmol/L (ref 98–111)
Creatinine, Ser: 0.67 mg/dL (ref 0.44–1.00)
GFR, Estimated: >60 mL/min (ref >60)
Glucose, Bld: 139 mg/dL — ABNORMAL HIGH (ref 70–99)
Potassium: 4 mmol/L (ref 3.5–5.1)
Sodium: 136 mmol/L (ref 135–145)

## 2024-06-03 MED ORDER — PROCHLORPERAZINE EDISYLATE 10 MG/2ML IJ SOLN
5.0000 mg | Freq: Four times a day (QID) | INTRAMUSCULAR | Status: DC | PRN
  Filled 2024-06-03: qty 2

## 2024-06-03 MED ORDER — NYSTATIN 100000 UNIT/GM EX POWD
Freq: Two times a day (BID) | CUTANEOUS | Status: DC
  Filled 2024-06-03: qty 15

## 2024-06-03 MED ORDER — NYSTATIN 100000 UNIT/GM EX CREA
TOPICAL_CREAM | Freq: Two times a day (BID) | CUTANEOUS | Status: DC
  Filled 2024-06-03: qty 30

## 2024-06-03 MED ORDER — APIXABAN 5 MG PO TABS
5.0000 mg | ORAL_TABLET | Freq: Two times a day (BID) | ORAL | Status: DC
  Administered 2024-06-03 – 2024-06-12 (×18): 5 mg via ORAL
  Filled 2024-06-03 (×18): qty 1

## 2024-06-03 MED ORDER — DIPHENHYDRAMINE HCL 25 MG PO CAPS: 25.0000 mg | ORAL_CAPSULE | Freq: Four times a day (QID) | ORAL | Status: DC | PRN

## 2024-06-03 MED ORDER — PROCHLORPERAZINE MALEATE 5 MG PO TABS
5.0000 mg | ORAL_TABLET | Freq: Four times a day (QID) | ORAL | Status: DC | PRN
  Administered 2024-06-11: 10 mg via ORAL
  Filled 2024-06-03: qty 2

## 2024-06-03 MED ORDER — INSULIN ASPART 100 UNIT/ML IJ SOLN
3.0000 [IU] | Freq: Three times a day (TID) | INTRAMUSCULAR | Status: DC
  Administered 2024-06-03 – 2024-06-04 (×4): 3 [IU] via SUBCUTANEOUS

## 2024-06-03 MED ORDER — INSULIN GLARGINE 100 UNIT/ML ~~LOC~~ SOLN
24.0000 [IU] | Freq: Every day | SUBCUTANEOUS | Status: DC
Start: 2024-06-03 — End: 2024-06-05
  Administered 2024-06-03 – 2024-06-04 (×2): 24 [IU] via SUBCUTANEOUS
  Filled 2024-06-03 (×3): qty 0.24

## 2024-06-03 MED ORDER — PREGABALIN 50 MG PO CAPS
50.0000 mg | ORAL_CAPSULE | Freq: Three times a day (TID) | ORAL | Status: DC
  Administered 2024-06-03 – 2024-06-12 (×26): 50 mg via ORAL
  Filled 2024-06-03 (×26): qty 1

## 2024-06-03 MED ORDER — INSULIN ASPART 100 UNIT/ML IJ SOLN
0.0000 [IU] | Freq: Three times a day (TID) | INTRAMUSCULAR | Status: DC
  Administered 2024-06-03 – 2024-06-04 (×2): 2 [IU] via SUBCUTANEOUS
  Administered 2024-06-04 – 2024-06-05 (×3): 5 [IU] via SUBCUTANEOUS
  Administered 2024-06-05: 3 [IU] via SUBCUTANEOUS
  Administered 2024-06-06: 1 [IU] via SUBCUTANEOUS
  Administered 2024-06-06: 5 [IU] via SUBCUTANEOUS
  Administered 2024-06-07: 3 [IU] via SUBCUTANEOUS
  Administered 2024-06-08 (×3): 2 [IU] via SUBCUTANEOUS
  Administered 2024-06-09: 1 [IU] via SUBCUTANEOUS
  Administered 2024-06-10: 2 [IU] via SUBCUTANEOUS
  Administered 2024-06-11: 7 [IU] via SUBCUTANEOUS
  Administered 2024-06-11: 2 [IU] via SUBCUTANEOUS
  Administered 2024-06-12: 3 [IU] via SUBCUTANEOUS

## 2024-06-03 MED ORDER — SODIUM CHLORIDE 0.9% FLUSH
10.0000 mL | Freq: Two times a day (BID) | INTRAVENOUS | Status: DC
  Administered 2024-06-05: 10 mL
  Administered 2024-06-05: 40 mL
  Administered 2024-06-06 – 2024-06-08 (×5): 10 mL

## 2024-06-03 MED ORDER — INSULIN ASPART 100 UNIT/ML IJ SOLN
0.0000 [IU] | Freq: Every day | INTRAMUSCULAR | Status: DC
  Administered 2024-06-03: 4 [IU] via SUBCUTANEOUS
  Administered 2024-06-04 – 2024-06-07 (×2): 2 [IU] via SUBCUTANEOUS

## 2024-06-03 MED ORDER — CHLORHEXIDINE GLUCONATE CLOTH 2 % EX PADS: 6.0000 | MEDICATED_PAD | Freq: Every day | CUTANEOUS | Status: DC

## 2024-06-03 MED ORDER — APIXABAN 5 MG PO TABS: 5.0000 mg | ORAL_TABLET | Freq: Two times a day (BID) | ORAL | Status: DC

## 2024-06-03 MED ORDER — SODIUM CHLORIDE 0.9% FLUSH: 10.0000 mL | INTRAVENOUS | Status: DC | PRN

## 2024-06-03 MED ORDER — OXYCODONE HCL 5 MG PO TABS: 10.0000 mg | ORAL_TABLET | Freq: Four times a day (QID) | ORAL | Status: DC | PRN

## 2024-06-03 MED ORDER — GUAIFENESIN-DM 100-10 MG/5ML PO SYRP: 5.0000 mL | ORAL_SOLUTION | Freq: Four times a day (QID) | ORAL | Status: DC | PRN

## 2024-06-03 MED ORDER — FLEET ENEMA RE ENEM: 1.0000 | ENEMA | Freq: Once | RECTAL | Status: DC | PRN

## 2024-06-03 MED ORDER — CHLORHEXIDINE GLUCONATE CLOTH 2 % EX PADS
6.0000 | MEDICATED_PAD | Freq: Two times a day (BID) | CUTANEOUS | Status: DC
  Administered 2024-06-03 – 2024-06-11 (×15): 6 via TOPICAL

## 2024-06-03 MED ORDER — METFORMIN HCL ER 500 MG PO TB24
1000.0000 mg | ORAL_TABLET | Freq: Two times a day (BID) | ORAL | Status: DC
  Administered 2024-06-03 – 2024-06-12 (×18): 1000 mg via ORAL
  Filled 2024-06-03 (×18): qty 2

## 2024-06-03 MED ORDER — ACETAMINOPHEN 325 MG PO TABS
325.0000 mg | ORAL_TABLET | ORAL | Status: DC | PRN
  Administered 2024-06-04 – 2024-06-11 (×6): 650 mg via ORAL
  Filled 2024-06-03 (×6): qty 2

## 2024-06-03 MED ORDER — CALCIUM CARBONATE ANTACID 500 MG PO CHEW: 1.0000 | CHEWABLE_TABLET | Freq: Four times a day (QID) | ORAL | Status: DC | PRN

## 2024-06-03 MED ORDER — ATORVASTATIN CALCIUM 80 MG PO TABS
80.0000 mg | ORAL_TABLET | Freq: Every evening | ORAL | Status: DC
  Administered 2024-06-03 – 2024-06-11 (×9): 80 mg via ORAL
  Filled 2024-06-03 (×9): qty 1

## 2024-06-03 MED ORDER — BISACODYL 10 MG RE SUPP: 10.0000 mg | Freq: Every day | RECTAL | Status: DC | PRN

## 2024-06-03 MED ORDER — FLUOXETINE HCL 20 MG PO CAPS
40.0000 mg | ORAL_CAPSULE | Freq: Every day | ORAL | Status: DC
  Administered 2024-06-04 – 2024-06-12 (×9): 40 mg via ORAL
  Filled 2024-06-03 (×9): qty 2

## 2024-06-03 MED ORDER — ALUM & MAG HYDROXIDE-SIMETH 200-200-20 MG/5ML PO SUSP: 30.0000 mL | ORAL | Status: DC | PRN

## 2024-06-03 MED ORDER — PROCHLORPERAZINE 25 MG RE SUPP: 12.5000 mg | Freq: Four times a day (QID) | RECTAL | Status: DC | PRN

## 2024-06-03 MED ORDER — TRAZODONE HCL 50 MG PO TABS: 25.0000 mg | ORAL_TABLET | Freq: Every evening | ORAL | Status: DC | PRN

## 2024-06-03 NOTE — Progress Notes (Signed)
 Inpatient Rehab Admissions Coordinator:   Prior auth for CIR pending.  Will follow.   Reche Lowers, PT, DPT Admissions Coordinator [phone] 06/03/24  10:10 AM

## 2024-06-03 NOTE — TOC Transition Note (Signed)
 Transition of Care [facility]) - Discharge Note   Patient Details  Name: Brenda [name] MRN: [id number] Date of Birth: 1961-07-26  [facility] CM/SW Contact:  Andrez JULIANNA George, RN Phone Number: 06/03/2024, 11:02 AM   Clinical Narrative:     Pt is discharging to CIR today. CM signing off.   Final next level of care: IP Rehab Facility Barriers to Discharge: No Barriers Identified   Patient Goals and CMS Choice   CMS Medicare.gov Compare Post Acute Care list provided to:: Patient Represenative (must comment) Choice offered to / list presented to : Patient, Spouse      Discharge Placement                       Discharge Plan and Services Additional resources added to the After Visit Summary for     Discharge Planning Services: CM Consult Post Acute Care Choice: IP Rehab                               Social Drivers of Health (SDOH) Interventions SDOH Screenings   Food Insecurity: No Food Insecurity (05/31/2024)  Recent Concern: Food Insecurity - Food Insecurity Present (04/20/2024)  Housing: Low Risk  (05/31/2024)  Transportation Needs: No Transportation Needs (05/31/2024)  Utilities: Not At Risk (05/31/2024)  Depression (PHQ2-9): Medium Risk (05/07/2024)  Financial Resource Strain: High Risk (02/10/2021)  Social Connections: Unknown (05/04/2024)  Tobacco Use: Medium Risk (05/30/2024)     Readmission Risk Interventions    05/04/2024   10:00 AM  Readmission Risk Prevention Plan  Transportation Screening Complete  PCP or Specialist Appt within 3-5 Days Complete  HRI or Home Care Consult Complete  Social Work Consult for Recovery Care Planning/Counseling Complete  Palliative Care Screening Not Applicable  Medication Review Oceanographer) Complete

## 2024-06-03 NOTE — Plan of Care (Signed)
  Problem: Pain Managment: Goal: General experience of comfort will improve and/or be controlled Outcome: Progressing   Problem: Safety: Goal: Ability to remain free from injury will improve Outcome: Progressing   Problem: Skin Integrity: Goal: Risk for impaired skin integrity will decrease Outcome: Progressing

## 2024-06-03 NOTE — Progress Notes (Signed)
 Urbano Albright, MD  Physician Physical Medicine and Rehabilitation   PMR Pre-admission    Signed   Date of Service: 06/02/2024  3:19 PM  Related encounter: ED to Hosp-Admission (Current) from 05/30/2024 in [place] [facility]   Signed     Expand All Collapse All  PMR Admission Coordinator Pre-Admission Assessment   Patient: Brenda [name] is an 63 y.o., female MRN: [id number] DOB: [birth year] Height: 5' 2 (157.5 cm) Weight: 85 kg   Insurance Information HMO:     PPO:      PCP:      IPA:      80/20:      OTHER:  PRIMARY: [place] [employer]      Policy#: [id number]      Subscriber: pt CM Name: faxed approval      Phone#: n/a     Fax#: [phone] or availity portal Pre-Cert#: [id number]  auth for CIR via faxed approval with [employer] for admit 10/1 with next review date 10/10.  Updates due to portal or fax listed above.       Employer:  Benefits:  Phone #: [phone]     Name:  Eff. Date: 11/01/21     Deduct: $0      Out of Pocket Max: $0      Life Max:  CIR: 100%      SNF:  Outpatient:      Co-Pay:  Home Health:       Co-Pay:  DME:      Co-Pay:  Providers:  SECONDARY:       Policy#:      Phone#:    Artist:       Phone#:    The Data processing manager" for patients in Inpatient Rehabilitation Facilities with attached "Privacy Act Statement-Health Care Records" was provided and verbally reviewed with: Patient and Family   Emergency Contact Information Contact Information       Name Relation Home Work Mobile    Chipley,Matthew Spouse [phone]   [phone]    White,[name] Daughter [phone]   [phone]         Other Contacts       Name Relation Home Work Mobile    Napier,Marlis Sister   [phone]             Current Medical History  Patient Admitting Diagnosis: CVA    History of Present Illness: Pt is a 63 y/o female with PMH of HTN, stage IV metastatic uterine cancer, DM, OSA  on CPAP and chronic O2 who was admitted 05/31/24 with word finding deficits.  CT concerning for acute/subacute infarct in R temporal and occipital lobe and MRI confirmed numerous small acute embolic infarcts in the bilateral anterior posterior circulations.  Neurology switched aspirin  to eliquis feeling that CVA was related to hypercoagulable state from metastatic cancer.  Per Dr. Mellie, cancelled keytruda  on 9/30 and reschedule at a later date.  She has had some encephalopathy which is resolving with reducing opiate use.  Therapy ongoing and recommendations are for CIR>    Complete NIHSS TOTAL: 1   Patient's medical record from [facility] has been reviewed by the rehabilitation admission coordinator and physician.   Past Medical History      Past Medical History:  Diagnosis Date   Anemia     Arthritis      back, hands, hips (02/22/2015)   CAD (coronary artery disease)      a. complex LAD/diagonal bifurcation  PCI in 2010. b. STEMI 10/2019 s/p PTCA/DES x1 to mLAD overlapping the old stent, residual disease treated medicaly.   Cancer [facility])      uterine   CHF (congestive heart failure) (HCC)     Depression     Diabetes mellitus (HCC)      started when I was pregnant; not sure if it was type 1 or type 2    History of radiation therapy      Endometrium- HDR 01/22/22-03/07/22- Dr. Lynwood Nasuti   Hypercholesterolemia     Hypertension     Morbid obesity (HCC)     Myocardial infarction (HCC)      mild x 3   Neuromuscular disorder (HCC)      neuropathy feet   Sleep apnea      cpap/          Has the patient had major surgery during 100 days prior to admission? No   Family History   family history includes COPD in her father; Cancer in her mother; Coronary artery disease in her mother and sister; Diabetes in her sister; Heart attack in her father; Hypertension in her mother; Other in her half-sister; Stroke in her sister; Thyroid  cancer in her daughter.   Current  Medications  Current Medications    Current Facility-Administered Medications:    apixaban (ELIQUIS) tablet 5 mg, 5 mg, Oral, BID, Paytes, Austin A, RPH, 5 mg at 06/02/24 0901   atorvastatin  (LIPITOR ) tablet 80 mg, 80 mg, Oral, QPM, Segars, Dorn, MD, 80 mg at 06/01/24 1701   calcium  carbonate (TUMS - dosed in mg elemental calcium ) chewable tablet 200 mg of elemental calcium , 1 tablet, Oral, QID PRN, [place], Eric J, DO, 200 mg of elemental calcium  at 05/31/24 1251   Chlorhexidine  Gluconate Cloth 2 % PADS 6 each, 6 each, Topical, Daily, Lonn, Ni, MD, 6 each at 06/02/24 9094   FLUoxetine  (PROZAC ) capsule 40 mg, 40 mg, Oral, Daily, Segars, Dorn, MD, 40 mg at 06/02/24 9141   insulin  aspart (novoLOG ) injection 0-5 Units, 0-5 Units, Subcutaneous, QHS, Segars, Jonathan, MD, 2 Units at 06/01/24 2154   insulin  aspart (novoLOG ) injection 0-9 Units, 0-9 Units, Subcutaneous, TID WC, Segars, Dorn, MD, 5 Units at 06/02/24 1208   insulin  aspart (novoLOG ) injection 3 Units, 3 Units, Subcutaneous, TID WC, Samtani, Jai-Gurmukh, MD   insulin  glargine (LANTUS ) injection 24 Units, 24 Units, Subcutaneous, Q0600, Paytes, Austin A, RPH   metFORMIN (GLUCOPHAGE-XR) 24 hr tablet 1,000 mg, 1,000 mg, Oral, BID WC, Samtani, Jai-Gurmukh, MD, 1,000 mg at 06/02/24 0900   ondansetron  (ZOFRAN ) injection 4 mg, 4 mg, Intravenous, Q6H PRN, [place], Camellia PARAS, DO, 4 mg at 05/31/24 1251   oxyCODONE  (Oxy IR/ROXICODONE ) immediate release tablet 10 mg, 10 mg, Oral, Q6H PRN, Segars, Jonathan, MD, 10 mg at 06/02/24 9140   pregabalin  (LYRICA ) capsule 50 mg, 50 mg, Oral, TID, Segars, Dorn, MD, 50 mg at 06/02/24 0901   sodium chloride  flush (NS) 0.9 % injection 10-40 mL, 10-40 mL, Intracatheter, Q12H, Gorsuch, Ni, MD, 10 mL at 06/02/24 9094   sodium chloride  flush (NS) 0.9 % injection 10-40 mL, 10-40 mL, Intracatheter, PRN, Lonn, Ni, MD     Patients Current Diet:  Diet Order                  Diet Heart Room service  appropriate? Yes; Fluid consistency: Thin  Diet effective now  Precautions / Restrictions Precautions Precautions: Fall Restrictions Weight Bearing Restrictions Per Provider Order: No    Has the patient had 2 or more falls or a fall with injury in the past year? Yes   Prior Activity Level Limited Community (1-2x/wk): rollator for ambulation, driving short distances since august, transport for chair for full community access   Prior Functional Level Self Care: Did the patient need help bathing, dressing, using the toilet or eating? Independent   Indoor Mobility: Did the patient need assistance with walking from room to room (with or without device)? Independent   Stairs: Did the patient need assistance with internal or external stairs (with or without device)? Independent   Functional Cognition: Did the patient need help planning regular tasks such as shopping or remembering to take medications? Independent   Patient Information Are you of Hispanic, Latino/a,or Spanish origin?: A. No, not of Hispanic, Latino/a, or Spanish origin What is your race?: A. White Do you need or want an interpreter to communicate with a doctor or health care staff?: 0. No   Patient's Response To:  Health Literacy and Transportation Is the patient able to respond to health literacy and transportation needs?: Yes Health Literacy - How often do you need to have someone help you when you read instructions, pamphlets, or other written material from your doctor or pharmacy?: Never In the past 12 months, has lack of transportation kept you from medical appointments or from getting medications?: No In the past 12 months, has lack of transportation kept you from meetings, work, or from getting things needed for daily living?: No   Journalist, newspaper / Equipment Home Equipment: Rollator (4 wheels), BSC/3in1, Shower seat, Grab bars - toilet, Grab bars - tub/shower (transport chair)    Prior Device Use: Indicate devices/aids used by the patient prior to current illness, exacerbation or injury? Walker   Current Functional Level Cognition   Arousal/Alertness: Awake/alert Overall Cognitive Status: Impaired/Different from baseline Orientation Level: Oriented to person, Oriented to place, Oriented to situation, Disoriented to time Attention: Sustained Sustained Attention: Appears intact Memory: Impaired Memory Impairment: Retrieval deficit Awareness: Impaired Awareness Impairment: Emergent impairment Problem Solving: Impaired Problem Solving Impairment: Verbal basic, Functional basic Executive Function: Reasoning Reasoning: Appears intact    Extremity Assessment (includes Sensation/Coordination)   Upper Extremity Assessment: Generalized weakness (MMT not performed 2/2 metastatic uterine CA)  Lower Extremity Assessment: Defer to PT evaluation RLE Deficits / Details: Strength 5/5 LLE Deficits / Details: Strength 5/5     ADLs   Overall ADL's : Needs assistance/impaired Grooming: Moderate assistance, Cueing for sequencing, Standing, Wash/dry hands, Oral care Grooming Details (indicate cue type and reason): cues for identifying grooming supplies & appropriate items on wall (light switch, paper towel & soap dispensers), pt attempting to turn faucet on by moving lightswitch and touching open outlet Lower Body Dressing: Moderate assistance, Cueing for sequencing, Sitting/lateral leans Lower Body Dressing Details (indicate cue type and reason): adjusting B socks Toilet Transfer: Minimal assistance, Ambulation, Regular Toilet Toilet Transfer Details (indicate cue type and reason): cues to safely approach toilet Functional mobility during ADLs: Minimal assistance, Cueing for safety, Cueing for sequencing     Mobility   Overal bed mobility: Needs Assistance Bed Mobility: Supine to Sit Supine to sit: Supervision General bed mobility comments: Exited to the R side      Transfers   Overall transfer level: Needs assistance Equipment used: Rollator (4 wheels) Transfers: Sit to/from Stand Sit to Stand: Contact guard assist Bed to/from chair/wheelchair/BSC transfer  type:: Step pivot Step pivot transfers: Contact guard assist General transfer comment: Stood from bed height & lower toilet height with cues for hand placement. Cued to use grab bars to assist in bathroom.     Ambulation / Gait / Stairs / Wheelchair Mobility   Ambulation/Gait Ambulation/Gait assistance: Contact guard assist, Min assist Gait Distance (Feet): 60 Feet (60, 60) Assistive device: Rollator (4 wheels) Gait Pattern/deviations: Step-through pattern, Decreased stride length General Gait Details: Pt with slow and steady pace with Rollator, one lateral LOB with turn and pivot towards left, requiring minA to correct. Pt able to verbalize need for seated rest break, but needs assist with  managing Rollator i.e. locking and unlocking brakes and parking Rollator Gait velocity: reduced Gait velocity interpretation: <1.8 ft/sec, indicate of risk for recurrent falls     Posture / Balance Dynamic Sitting Balance Sitting balance - Comments: no LOB seated EOB Balance Overall balance assessment: Needs assistance Sitting-balance support: Feet supported Sitting balance-Leahy Scale: Good Sitting balance - Comments: no LOB seated EOB Standing balance support: Bilateral upper extremity supported, During functional activity Standing balance-Leahy Scale: Poor Standing balance comment: pt often with truncal sway and nBOS during functional ambulation in room, often reaching for walls/doorframes for support     Special considerations/life events  CPAP, Oxygen  2L, and Diabetic management yes    Previous Home Environment (from acute therapy documentation) Living Arrangements: Children, Spouse/significant other  Lives With: Spouse Available Help at Discharge: Family, Available PRN/intermittently Type of  Home: House Home Layout: One level Home Access: Ramped entrance Bathroom Shower/Tub: Armed forces operational officer Accessibility: Yes Home Care Services: No Additional Comments: Pt has intermittently used rollator but does not want to use one; family uses transport w/c for community distances   Discharge Living Setting Plans for Discharge Living Setting: Patient's home, Lives with (comment) (spouse, 3 adult children PRN) Type of Home at Discharge: House Discharge Home Layout: One level Discharge Home Access: Ramped entrance Discharge Bathroom Shower/Tub: Tub/shower unit Discharge Bathroom Toilet: Standard Discharge Bathroom Accessibility: Yes How Accessible: Accessible via walker Does the patient have any problems obtaining your medications?: No   Social/Family/Support Systems Patient Roles: Spouse Anticipated Caregiver: Deonna Krummel Anticipated Caregiver's Contact Information: [phone] Ability/Limitations of Caregiver: none stated, can provide physical assist if needed; between spouse and 3 chidlren will have 24/7 Caregiver Availability: 24/7 Discharge Plan Discussed with Primary Caregiver: Yes Is Caregiver In Agreement with Plan?: Yes Does Caregiver/Family have Issues with Lodging/Transportation while Pt is in Rehab?: No   Goals Patient/Family Goal for Rehab: PT/OT/SLP supervision to mod I Expected length of stay: 7-10 days Additional Information: Discharge plan: return to pt's home, spouse and 3 adult children to provide 24/7 support Pt/Family Agrees to Admission and willing to participate: Yes Program Orientation Provided & Reviewed with Pt/Caregiver Including Roles  & Responsibilities: Yes   Decrease burden of Care through IP rehab admission: n/a   Possible need for SNF placement upon discharge:  Not anticipated.  Pt with excellent family support for discharge home with 24/7 care.    Patient Condition: This patient's condition remains as  documented in the consult dated 06/02/24, in which the Rehabilitation Physician determined and documented that the patient's condition is appropriate for intensive rehabilitative care in an inpatient rehabilitation facility. Will admit to inpatient rehab today.   Preadmission Screen Completed By:  Reche FORBES Lowers, 06/02/2024 3:19 PM ______________________________________________________________________   Discussed status with Dr. Urbano on 06/03/24  at 11:02 AM  and received approval for admission  today.   Admission Coordinator:  Keyosha Tiedt E Dannel Rafter, PT, time 11:02 AM Pattricia 06/03/24     Assessment/Plan: Diagnosis: CVA Does the need for close, 24 hr/day Medical supervision in concert with the patient's rehab needs make it unreasonable for this patient to be served in a less intensive setting? Yes Co-Morbidities requiring supervision/potential complications: DVT, CAD, NSTEMI, Uterine cancer, diabetes, HTN, HLD, Obesity, OSA, chronic respiratory failure, chronic pain  Due to bladder management, bowel management, safety, skin/wound care, disease management, medication administration, pain management, and patient education, does the patient require 24 hr/day rehab nursing? Yes Does the patient require coordinated care of a physician, rehab nurse, PT, OT, and SLP to address physical and functional deficits in the context of the above medical diagnosis(es)? Yes Addressing deficits in the following areas: balance, endurance, locomotion, strength, transferring, bowel/bladder control, bathing, dressing, feeding, grooming, toileting, cognition, speech, language, swallowing, and psychosocial support Can the patient actively participate in an intensive therapy program of at least 3 hrs of therapy 5 days a week? Yes The potential for patient to make measurable gains while on inpatient rehab is excellent Anticipated functional outcomes upon discharge from inpatient rehab: modified independent and supervision PT,  modified independent and supervision OT, modified independent and supervision SLP Estimated rehab length of stay to reach the above functional goals is: 7-10 days Anticipated discharge destination: Home 10. Overall Rehab/Functional Prognosis: excellent     MD Signature: Murray Collier         Revision History

## 2024-06-03 NOTE — Discharge Summary (Signed)
 Physician Discharge Summary  Brenda [name] FMW:[id number] DOB: 1961-06-08 DOA: 05/30/2024  PCP: Lonn Hicks, MD  Admit date: 05/30/2024 Discharge date: 06/03/2024  Admitted From: Home Disposition:  CIR   Discharge Condition: Stable CODE STATUS: Full  Diet recommendation:  Diet Orders (From admission, onward)     Start     Ordered   06/03/24 0000  Diet - low sodium heart healthy        06/03/24 1136   05/31/24 1540  Diet Heart Room service appropriate? Yes; Fluid consistency: Thin  Diet effective now       Question Answer Comment  Room service appropriate? Yes   Fluid consistency: Thin      05/31/24 1539            Brief/Interim Summary: Brenda [name] is a 63 y.o. 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 Reports sudden onset of symptoms ~ 2130 which gradually improved and now feels that speech mostly back to normal. Denies any other new symptoms this evening. However, recently over the past few weeks has noted R sided headaches, associated episodes of nausea and vomiting, and seeing colors in her R visual field. No other seizure like activity noted. She has also had weakness mostly in the left leg, especially in the hip flexor over the past month which she feels is unchanged. She has had multiple falls related to weakness in this leg, but none in the past week or so. Had not noted any acute visual change tonight or change in weakness, numbness. She also notes she had a recent retinal procedure on the left eye, for what sounds like diabetic retinopathy and noted that L pupil has been more  dilated since this time. Otherwise notes that she had recent UTI and completed a course of antibiotics as outpatient, no recurrent urinary symptoms.   On admission, patient was found to have acute/subacute infarct.  Stroke team was consulted.  It was thought that her acute embolic infarcts were in setting of hypercoagulability from advanced malignancy.  Patient was recommended Eliquis.  Patient worked with PT, OT and recommended for CIR placement.  Patient will need outpatient follow-up at stroke clinic in 4 weeks.    Discharge Diagnoses:  Principal Problem:   Acute ischemic stroke [facility]) Active Problems:   Hyperlipidemia   Essential hypertension   CAD (coronary artery disease)   Obstructive sleep apnea   Morbid obesity (HCC)   Type 2 diabetes mellitus with complication, with long-term current use of insulin  (HCC)   Chronic hypoxic respiratory failure (HCC)   Endometrial carcinoma (HCC)   History of CAD (coronary artery disease)   Metastatic malignant neoplasm [facility])   Brain lesion    Discharge Instructions  Discharge Instructions     Ambulatory referral to Neurology   Complete by: As directed    Follow up with stroke clinic NP at [facility] in about 4-6 weeks. Thanks.   Diet - low sodium heart healthy   Complete by: As directed    Increase activity slowly   Complete by: As directed       Allergies as of 06/03/2024  Reactions   Hydrocodone Shortness Of Breath, Dermatitis, Other (See Comments)   I forget to breathe.   Clopidogrel Rash, Dermatitis        Medication List     STOP taking these medications    cyproheptadine  4 MG tablet Commonly known as: PERIACTIN    diphenhydrAMINE  25 mg capsule Commonly known as: BENADRYL    lidocaine -prilocaine  cream Commonly known as: EMLA    polyethylene glycol powder 17 GM/SCOOP powder Commonly known as: GLYCOLAX /MIRALAX    prasugrel  5 MG Tabs tablet Commonly known as: Effient    prochlorperazine  10 MG tablet Commonly  known as: COMPAZINE        TAKE these medications    acetaminophen  500 MG tablet Commonly known as: TYLENOL  Take 1-2 tablets (500-1,000 mg total) by mouth every 8 (eight) hours as needed for mild pain (pain score 1-3) or moderate pain (pain score 4-6).   apixaban 5 MG Tabs tablet Commonly known as: ELIQUIS Take 1 tablet (5 mg total) by mouth 2 (two) times daily.   atorvastatin  80 MG tablet Commonly known as: LIPITOR  Take 1 tablet (80 mg total) by mouth every evening.   buPROPion  150 MG 24 hr tablet Commonly known as: WELLBUTRIN  XL Take 150 mg by mouth in the morning.   Fiasp  FlexTouch 100 UNIT/ML FlexTouch Pen Generic drug: insulin  aspart Inject 5 Units into the skin with breakfast, with lunch, and with evening meal.   FLUoxetine  40 MG capsule Commonly known as: PROZAC  Take 40 mg by mouth daily.   guaiFENesin  600 MG 12 hr tablet Commonly known as: MUCINEX  Take 1 tablet (600 mg total) by mouth 2 (two) times daily as needed for cough or to loosen phlegm.   insulin  degludec 100 UNIT/ML FlexTouch Pen Commonly known as: TRESIBA Inject 24 Units into the skin every evening.   lactulose  10 GM/15ML solution Commonly known as: CHRONULAC  Take 45 mLs (30 g total) by mouth 2 (two) times daily as needed for mild constipation or moderate constipation.   lidocaine  5 % Commonly known as: LIDODERM  Place 1 patch onto the skin daily.   losartan  25 MG tablet Commonly known as: COZAAR  Take 1 tablet (25 mg total) by mouth daily.   metFORMIN 500 MG 24 hr tablet Commonly known as: GLUCOPHAGE-XR Take 1,000 mg by mouth in the morning and at bedtime.   Misc. Devices Misc Portable oxygen  concentrator, 2L oxygen .  Diagnoses-chronic respiratory failure with hypoxia   NEURIVA PO Take 1 tablet by mouth daily.   nitroGLYCERIN  0.4 MG SL tablet Commonly known as: NITROSTAT  Dissolve 1 tablet under the tongue every 5 minutes as needed for chest pain. Max of 3 doses, then 911.   ondansetron   8 MG tablet Commonly known as: ZOFRAN  Take 1 tablet (8 mg total) by mouth every 8 (eight) hours as needed for nausea or vomiting. Start on the third day after carboplatin .   Oxycodone  HCl 10 MG Tabs Take 1 tablet (10 mg total) by mouth every 6 (six) hours as needed. What changed: reasons to take this   OXYGEN  Inhale 2 L/min into the lungs continuous.   pregabalin  50 MG capsule Commonly known as: LYRICA  Take 1 capsule (50 mg total) by mouth 2 (two) times daily. What changed: when to take this   senna-docusate 8.6-50 MG tablet Commonly known as: Senokot-S Take 1-2 tablets by mouth 2 (two) times daily between meals as needed for mild constipation or moderate constipation. What changed: when to take this   Vitamin D3 Super Strength 50 MCG (2000 UT) Caps Generic drug:  Cholecalciferol  Take 2,000 Units by mouth in the morning.        Follow-up Information     [facility]. Schedule an appointment as soon as possible for a visit in 1 month(s).   Specialty: Neurology Why: stroke clinic Contact information: [address] [place]  [address] [phone]               Allergies  Allergen Reactions   Hydrocodone Shortness Of Breath, Dermatitis and Other (See Comments)    I forget to breathe.   Clopidogrel Rash and Dermatitis     Procedures/Studies: VAS US  LOWER EXTREMITY VENOUS (DVT) Result Date: 06/02/2024  Lower Venous DVT Study Patient Name:  Brenda [name]  Date of Exam:   06/02/2024 Medical Rec #: [id number]            Accession #:    [id number] Date of Birth: [birth year]            Patient Gender: F Patient Age:   89 years Exam Location:  [facility] Procedure:      VAS US  LOWER EXTREMITY VENOUS (DVT) Referring Phys: ARY XU --------------------------------------------------------------------------------  Indications: Stroke.  Risk Factors: Metastatic cancer, chemotherapy. Comparison Study: Previous  exam on 02/09/2021 was negative for DVT Performing Technologist: Ezzie Potters RVT, RDMS  Examination Guidelines: A complete evaluation includes B-mode imaging, spectral Doppler, color Doppler, and power Doppler as needed of all accessible portions of each vessel. Bilateral testing is considered an integral part of a complete examination. Limited examinations for reoccurring indications may be performed as noted. The reflux portion of the exam is performed with the patient in reverse Trendelenburg.  +---------+---------------+---------+-----------+----------+-------------------+ RIGHT    CompressibilityPhasicitySpontaneityPropertiesThrombus Aging      +---------+---------------+---------+-----------+----------+-------------------+ CFV      Full           Yes      Yes                                      +---------+---------------+---------+-----------+----------+-------------------+ SFJ      Full                                                             +---------+---------------+---------+-----------+----------+-------------------+ FV Prox  Full           Yes      Yes                                      +---------+---------------+---------+-----------+----------+-------------------+ FV Mid   Full           Yes      Yes                                      +---------+---------------+---------+-----------+----------+-------------------+ FV DistalFull           Yes      Yes                                      +---------+---------------+---------+-----------+----------+-------------------+  PFV      Full                                                             +---------+---------------+---------+-----------+----------+-------------------+ POP      Full           Yes      Yes                                      +---------+---------------+---------+-----------+----------+-------------------+ PTV      Full                                                              +---------+---------------+---------+-----------+----------+-------------------+ PERO     None           No       No                   Acute one of paired +---------+---------------+---------+-----------+----------+-------------------+   +---------+---------------+---------+-----------+----------+--------------+ LEFT     CompressibilityPhasicitySpontaneityPropertiesThrombus Aging +---------+---------------+---------+-----------+----------+--------------+ CFV      Full           Yes      Yes                                 +---------+---------------+---------+-----------+----------+--------------+ SFJ      Full                                                        +---------+---------------+---------+-----------+----------+--------------+ FV Prox  Full           Yes      Yes                                 +---------+---------------+---------+-----------+----------+--------------+ FV Mid   Full           Yes      Yes                                 +---------+---------------+---------+-----------+----------+--------------+ FV DistalFull           Yes      Yes                                 +---------+---------------+---------+-----------+----------+--------------+ PFV      Full                                                        +---------+---------------+---------+-----------+----------+--------------+  POP      Full           Yes      Yes                                 +---------+---------------+---------+-----------+----------+--------------+ PTV      Full                                                        +---------+---------------+---------+-----------+----------+--------------+ PERO     Full                                                        +---------+---------------+---------+-----------+----------+--------------+     Summary: BILATERAL: -No evidence of popliteal cyst, bilaterally. RIGHT: - Findings consistent  with acute deep vein thrombosis involving the right peroneal veins.   LEFT: - There is no evidence of deep vein thrombosis in the lower extremity.  *See table(s) above for measurements and observations. Electronically signed by Debby Robertson on 06/02/2024 at 4:10:55 PM.    Final    ECHOCARDIOGRAM COMPLETE Result Date: 06/01/2024    ECHOCARDIOGRAM REPORT   Patient Name:   Brenda [name] Epler Date of Exam: 06/01/2024 Medical Rec #:  [id number]           Height:       62.0 in Accession #:    [id number]          Weight:       187.4 lb Date of Birth:  February 17, 1961           BSA:          1.860 m Patient Age:    63 years            BP:           101/63 mmHg Patient Gender: F                   HR:           56 bpm. Exam Location:  Inpatient Procedure: 2D Echo, Cardiac Doppler, Color Doppler and Saline Contrast Bubble            Study (Both Spectral and Color Flow Doppler were utilized during            procedure). Indications:    Stroke  History:        Patient has prior history of Echocardiogram examinations, most                 recent 04/21/2024. CHF, CAD, Signs/Symptoms:Dyspnea; Risk                 Factors:Hypertension, Dyslipidemia, Sleep Apnea and Diabetes.  Sonographer:    Philomena Daring Referring Phys: ERIC J [place] IMPRESSIONS  1. Left ventricular ejection fraction, by estimation, is 55 to 60%. The left ventricle has normal function. The left ventricle has no regional wall motion abnormalities. Left ventricular diastolic parameters are consistent with Grade II diastolic dysfunction (pseudonormalization).  2. Right ventricular systolic function is low normal. The right ventricular size is normal.  3. The mitral valve is  normal in structure. No evidence of mitral valve regurgitation. No evidence of mitral stenosis.  4. The aortic valve is calcified. There is moderate calcification of the aortic valve. There is moderate thickening of the aortic valve. Aortic valve regurgitation is not visualized. Mild aortic valve  stenosis. Aortic valve area, by VTI measures 1.47 cm. Aortic valve mean gradient measures 15.0 mmHg. Aortic valve Vmax measures 2.71 m/s.  5. The inferior vena cava is normal in size with greater than 50% respiratory variability, suggesting right atrial pressure of 3 mmHg. FINDINGS  Left Ventricle: Left ventricular ejection fraction, by estimation, is 55 to 60%. The left ventricle has normal function. The left ventricle has no regional wall motion abnormalities. The left ventricular internal cavity size was normal in size. There is  no left ventricular hypertrophy. Left ventricular diastolic parameters are consistent with Grade II diastolic dysfunction (pseudonormalization). Right Ventricle: The right ventricular size is normal. No increase in right ventricular wall thickness. Right ventricular systolic function is low normal. Left Atrium: Left atrial size was normal in size. Right Atrium: Right atrial size was normal in size. Pericardium: There is no evidence of pericardial effusion. Mitral Valve: The mitral valve is normal in structure. No evidence of mitral valve regurgitation. No evidence of mitral valve stenosis. Tricuspid Valve: The tricuspid valve is normal in structure. Tricuspid valve regurgitation is not demonstrated. No evidence of tricuspid stenosis. Aortic Valve: The aortic valve is calcified. There is moderate calcification of the aortic valve. There is moderate thickening of the aortic valve. Aortic valve regurgitation is not visualized. Mild aortic stenosis is present. Aortic valve mean gradient measures 15.0 mmHg. Aortic valve peak gradient measures 29.4 mmHg. Aortic valve area, by VTI measures 1.47 cm. Pulmonic Valve: The pulmonic valve was normal in structure. Pulmonic valve regurgitation is not visualized. No evidence of pulmonic stenosis. Aorta: The aortic root is normal in size and structure. Venous: The inferior vena cava is normal in size with greater than 50% respiratory variability,  suggesting right atrial pressure of 3 mmHg. IAS/Shunts: No atrial level shunt detected by color flow Doppler. Agitated saline contrast was given intravenously to evaluate for intracardiac shunting.  LEFT VENTRICLE PLAX 2D LVIDd:         5.20 cm   Diastology LVIDs:         3.60 cm   LV e' medial:    6.20 cm/s LV PW:         1.20 cm   LV E/e' medial:  19.5 LV IVS:        1.00 cm   LV e' lateral:   6.42 cm/s LVOT diam:     2.00 cm   LV E/e' lateral: 18.8 LV SV:         90 LV SV Index:   48 LVOT Area:     3.14 cm  RIGHT VENTRICLE            IVC RV S prime:     8.05 cm/s  IVC diam: 1.80 cm TAPSE (M-mode): 1.5 cm LEFT ATRIUM             Index        RIGHT ATRIUM           Index LA diam:        3.80 cm 2.04 cm/m   RA Area:     12.20 cm LA Vol (A2C):   52.0 ml 27.96 ml/m  RA Volume:   23.10 ml  12.42 ml/m LA Vol (A4C):  50.1 ml 26.94 ml/m LA Biplane Vol: 52.2 ml 28.07 ml/m  AORTIC VALVE AV Area (Vmax):    1.36 cm AV Area (Vmean):   1.36 cm AV Area (VTI):     1.47 cm AV Vmax:           271.00 cm/s AV Vmean:          179.250 cm/s AV VTI:            0.612 m AV Peak Grad:      29.4 mmHg AV Mean Grad:      15.0 mmHg LVOT Vmax:         117.00 cm/s LVOT Vmean:        77.600 cm/s LVOT VTI:          0.286 m LVOT/AV VTI ratio: 0.47  AORTA Ao Root diam: 2.80 cm Ao Asc diam:  3.50 cm MITRAL VALVE MV Area (PHT): 2.14 cm     SHUNTS MV Decel Time: 355 msec     Systemic VTI:  0.29 m MV E velocity: 121.00 cm/s  Systemic Diam: 2.00 cm MV A velocity: 123.00 cm/s MV E/A ratio:  0.98 Morene Brownie Electronically signed by Morene Brownie Signature Date/Time: 06/01/2024/9:38:18 PM    Final    EEG adult Result Date: 06/01/2024 Matthews Elida HERO, MD     06/01/2024  7:53 AM Routine EEG Report Brenda [name] is a 63 y.o. female with a history of altered mental status who is undergoing an EEG to evaluate for seizures. Report: This EEG was acquired with electrodes placed according to the International 10-20 electrode system  (including Fp1, Fp2, F3, F4, C3, C4, P3, P4, O1, O2, T3, T4, T5, T6, A1, A2, Fz, Cz, Pz). The following electrodes were missing or displaced: none. The occipital dominant rhythm was 6-7 Hz. This activity is reactive to stimulation. Drowsiness was manifested by background fragmentation; deeper stages of sleep were not identified. There was no focal slowing. There were no interictal epileptiform discharges. There were no electrographic seizures identified. Photic stimulation and hyperventilation were not performed. Impression and clinical correlation: This EEG was obtained while awake and drowsy and is abnormal due to mild diffuse slowing indicative of global cerebral dysfunction. Epileptiform abnormalities were not seen during this recording. Elida Matthews, MD [facility] [phone] If 7pm- 7am, please page neurology on call as listed in AMION.   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 Confusion, weakness, neurologic deficit. EXAM: MRI HEAD WITHOUT AND WITH CONTRAST TECHNIQUE: Multiplanar, multiecho pulse sequences of the brain and surrounding structures were obtained without and with intravenous contrast. CONTRAST:  8mL GADAVIST  GADOBUTROL  1 MMOL/ML IV SOLN COMPARISON:  Head CT without contrast 0101 hours today. Brain MRI 04/21/2024. FINDINGS: Brain: Confluent restricted diffusion in the right PCA territory from the posterior mesial temporal lobe, patchy right occipital lobe involvement, periatrial white matter involvement and marginal involvement of the splenium of the corpus callosum. Additional numerous small foci of restricted diffusion scattered in the bilateral cerebellum and cerebral hemispheres. Anterior and posterior circulation affected bilaterally. Deep gray nuclei and brainstem are relatively spared. Cytotoxic edema in the affected areas. No intracranial hemorrhage identified. Luxury perfusion enhancement  appearance in the right PCA territory (series 17, image 13). No masslike or suspicious postcontrast enhancement. No dural thickening. No midline shift, ventriculomegaly, evidence of discrete intracranial mass. Cervicomedullary junction and pituitary are within normal limits. No abnormal enhancement identified. Evidence of mass lesion, ventriculomegaly, extra-axial  collection or acute intracranial hemorrhage. Cervicomedullary junction and pituitary are within normal limits. Vascular: Major intracranial vascular flow voids are preserved. Skull and upper cervical spine: Visualized bone marrow signal is within normal limits. Negative visible cervical spine and spinal cord. Sinuses/Orbits: Negative. Other: Stable mild mastoid effusion since last month. IMPRESSION: 1. Numerous small acute embolic infarcts in the bilateral anterior and posterior circulation, with superimposed confluent Right PCA territory infarct corresponding to that seen on CT this morning. 2. Cytotoxic edema with no intracranial hemorrhage or mass effect. 3. No intracranial metastatic disease identified. Electronically Signed   By: VEAR Hurst M.D.   On: 05/31/2024 10:49   CT Head Wo Contrast Result Date: 05/31/2024 CLINICAL DATA:  Confusion, weakness, mental status changes EXAM: CT HEAD WITHOUT CONTRAST TECHNIQUE: Contiguous axial images were obtained from the base of the skull through the vertex without intravenous contrast. RADIATION DOSE REDUCTION: This exam was performed according to the departmental dose-optimization program which includes automated exposure control, adjustment of the mA and/or kV according to patient size and/or use of iterative reconstruction technique. COMPARISON:  04/20/2024 FINDINGS: Brain: Area of low-density noted in the right posterior temporal and occipital lobes adjacent to the right lateral ventricle, new since prior study concerning for acute to subacute infarcts. No hemorrhage or hydrocephalus. No mass effect or  midline shift. Vascular: No hyperdense vessel or unexpected calcification. Skull: No acute calvarial abnormality. Sinuses/Orbits: No acute findings Other: None IMPRESSION: New low-density areas in the posterior right temporal lobe and occipital lobe concerning for acute to subacute infarcts. These are new since prior study. Electronically Signed   By: Franky Crease M.D.   On: 05/31/2024 01:20   DG Chest Port 1 View Result Date: 05/31/2024 CLINICAL DATA:  Altered mental status EXAM: PORTABLE CHEST 1 VIEW COMPARISON:  05/21/2024 FINDINGS: Stable chronic elevation of the right hemidiaphragm. Right Port-A-Cath remains in place, unchanged. Cardiomediastinal contours are unchanged. No confluent airspace opacities, effusions or edema. No acute bony abnormality. IMPRESSION: No active disease. Electronically Signed   By: Franky Crease M.D.   On: 05/31/2024 00:20   DG CHEST PORT 1 VIEW Result Date: 05/21/2024 CLINICAL DATA:  10031 Cough 10031 EXAM: PORTABLE CHEST 1 VIEW COMPARISON:  Chest x-ray 05/04/2024, CT abdomen pelvis 05/04/2024 FINDINGS: Right chest wall accessed Port-A-Cath with tip overlying the expected region of the superior collection. The heart and mediastinal contours are unchanged. Persistent elevation of the right hemidiaphragm. No focal consolidation. No pulmonary edema. No pleural effusion. No pneumothorax. No acute osseous abnormality. IMPRESSION: No active disease. Electronically Signed   By: Morgane  Naveau M.D.   On: 05/21/2024 23:50     Discharge Exam: Vitals:   06/03/24 0741 06/03/24 0741  BP: (!) 116/51 (!) 116/51  Pulse: 62 61  Resp: 19 19  Temp: 98.9 F (37.2 C) 98.9 F (37.2 C)  SpO2: 99% 99%    General: Pt is alert, awake, not in acute distress Cardiovascular: RRR, S1/S2 +, no edema Respiratory: CTA bilaterally, no wheezing, no rhonchi, no respiratory distress, no conversational dyspnea  Abdominal: Soft, NT, ND, bowel sounds + Extremities: no edema, no cyanosis Psych:  Normal mood and affect, stable judgement and insight     The results of significant diagnostics from this hospitalization (including imaging, microbiology, ancillary and laboratory) are listed below for reference.     Microbiology: No results found for this or any previous visit (from the past 240 hours).   Labs: BNP (last 3 results) No results for input(s): BNP in the last  8760 hours. Basic Metabolic Panel: Recent Labs  Lab 05/28/24 1427 05/30/24 2350 05/31/24 0011 06/01/24 0518 06/03/24 0558  NA 138 133* 134* 133* 136  K 3.8 4.1 4.0 4.3 4.0  CL 102 98 97* 101 100  CO2 31 23  --  26 26  GLUCOSE 153* 280* 287* 167* 139*  BUN 8 8 7* 7* 8  CREATININE 0.53 0.77 0.80 0.68 0.67  CALCIUM  8.6* 8.8*  --  8.5* 8.6*   Liver Function Tests: Recent Labs  Lab 05/28/24 1427 05/30/24 2350  AST 11* 18  ALT 6 7  ALKPHOS 90 90  BILITOT 0.3 0.3  PROT 6.5 5.8*  ALBUMIN 3.3* 3.1*   No results for input(s): LIPASE, AMYLASE in the last 168 hours. Recent Labs  Lab 05/30/24 2350  AMMONIA 18   CBC: Recent Labs  Lab 05/28/24 1427 05/30/24 2350 05/31/24 0011 06/01/24 0518 06/03/24 0558  WBC 8.8 7.1  --  6.2 5.9  NEUTROABS 6.8 5.3  --   --  3.7  HGB 8.8* 8.1* 8.5* 8.4* 7.8*  HCT 27.8* 26.9* 25.0* 27.2* 25.2*  MCV 86.3 90.6  --  90.1 89.4  PLT 215 193  --  217 228   Cardiac Enzymes: No results for input(s): CKTOTAL, CKMB, CKMBINDEX, TROPONINI in the last 168 hours. BNP: Invalid input(s): POCBNP CBG: Recent Labs  Lab 06/02/24 0611 06/02/24 1202 06/02/24 1559 06/02/24 2133 06/03/24 0639  GLUCAP 244* 252* 191* 131* 128*   D-Dimer No results for input(s): DDIMER in the last 72 hours. Hgb A1c Recent Labs    06/01/24 0032  HGBA1C 7.2*   Lipid Profile No results for input(s): CHOL, HDL, LDLCALC, TRIG, CHOLHDL, LDLDIRECT in the last 72 hours. Thyroid  function studies No results for input(s): TSH, T4TOTAL, T3FREE, THYROIDAB in the  last 72 hours.  Invalid input(s): FREET3 Anemia work up No results for input(s): VITAMINB12, FOLATE, FERRITIN, TIBC, IRON, RETICCTPCT in the last 72 hours. Urinalysis    Component Value Date/Time   COLORURINE YELLOW 05/31/2024 0630   APPEARANCEUR CLEAR 05/31/2024 0630   LABSPEC 1.016 05/31/2024 0630   PHURINE 5.0 05/31/2024 0630   GLUCOSEU 150 (A) 05/31/2024 0630   HGBUR NEGATIVE 05/31/2024 0630   BILIRUBINUR NEGATIVE 05/31/2024 0630   BILIRUBINUR neg 08/16/2016 1147   KETONESUR NEGATIVE 05/31/2024 0630   PROTEINUR NEGATIVE 05/31/2024 0630   UROBILINOGEN 2.0 08/16/2016 1147   UROBILINOGEN 2.0 (H) 02/22/2015 2143   NITRITE NEGATIVE 05/31/2024 0630   LEUKOCYTESUR NEGATIVE 05/31/2024 0630   Sepsis Labs Recent Labs  Lab 05/28/24 1427 05/30/24 2350 06/01/24 0518 06/03/24 0558  WBC 8.8 7.1 6.2 5.9   Microbiology No results found for this or any previous visit (from the past 240 hours).   Patient was seen and examined on the day of discharge and was found to be in stable condition. Time coordinating discharge: 35 minutes including assessment and coordination of care, as well as examination of the patient.   SIGNED:  Delon Hoe, DO [facility] 06/03/2024, 11:36 AM

## 2024-06-03 NOTE — Progress Notes (Signed)
 Inpatient Rehab Admissions Coordinator:    I have insurance approval and a bed available for pt to admit to CIR today. Dr. Rojelio in agreement and [facility] aware.  I will notify pt/family and make arrangements.    Reche Lowers, PT, DPT Admissions Coordinator [phone] 06/03/24  11:03 AM

## 2024-06-03 NOTE — H&P (Signed)
 Physical Medicine and Rehabilitation Admission H&P        Chief Complaint  Patient presents with   Functional deficits due to CVA  : HPI: Brenda [name] is a 63 year old female with a complex medical history, including hypertension, stage IV metastatic uterine cancer, type two diabetes, obstructive sleep apnea on chronic CPAP and oxygen , chronic respiratory failure, and CAD/NSTEMI status post stenting (10/2019) on Effient  and Lipitor  80 mg. Allergic to Plavix. Patient presented to [facility] via EMS on 05/31/24 d/t mild confusion and occasional word finding difficulty, noted to be baseline since chemotherapy.  A CT of the head was concerning for an acute to subacute infarct in the right temporal and occipital lobe. A CTA of the head and neck was previously unremarkable. MRI showed embolic infarcts and patient was transferred to [facility] for further workup. Neurology consulted and patient was placed on aspirin , this was later switched to Eliquis as CVA was felt likely due to hypercoagulable state from metastatic cancer.  She is followed by Dr. Mellie for metastatic uterine cancer dx in 2023 (s/p surgery and radiation). Patient with severe back pain 03/2024, CT showed recurrent disease in the retroperitoneal lymph node. CT chest showed lung mass suspicious for metastatic lesion. Pathology confirmed left retroperitoneal mass to be adenocarcinoma.  On 9/28 abnormal EEG due to mild diffuse slowing from global cerebral dysfunction.  Echo EF 55-60% grade 2 dysfunction with mild aortic valve stenosis.  Venous Doppler positive for right peroneal vein acute DVT on 9/30, likely related to hypercoagulable state.  Patient reports that as a result of her cancer she has back pain with radiation down her left leg. Per chart review the patient lives at home with her spouse in a 1 level house with ramped entry.  She was independent and driving prior to arrival, there is history of some recent falls.   She currently requires CGA for sit to stand and ambulate 60 feet at min assist to CGA with rollator. Therapy evaluations completed due to patient decreased functional mobility was admitted for a comprehensive rehab program.    Review of Systems  Constitutional:  Negative for chills and fever.  HENT:  Negative for hearing loss.   Eyes:  Negative for blurred vision and double vision.       Difficulty seeing to left, hx eye surgery  Respiratory:  Negative for cough and shortness of breath.        SOB with a lot of activity  Cardiovascular:  Negative for chest pain.  Gastrointestinal:  Positive for abdominal pain. Negative for constipation, diarrhea and vomiting.  Genitourinary: Negative.   Musculoskeletal:  Positive for back pain. Negative for joint pain and neck pain.       Pain radiating down left thigh  Skin:  Positive for rash (yeast).       Past Medical History:  Diagnosis Date   Anemia     Arthritis      back, hands, hips (02/22/2015)   CAD (coronary artery disease)      a. complex LAD/diagonal bifurcation PCI in 2010. b. STEMI 10/2019 s/p PTCA/DES x1 to mLAD overlapping the old stent, residual disease treated medicaly.   Cancer (HCC)      uterine   CHF (congestive heart failure) (HCC)     Depression     Diabetes mellitus (HCC)      started when I was pregnant; not sure if it was type 1 or type 2  History of radiation therapy      Endometrium- HDR 01/22/22-03/07/22- Dr. Lynwood Nasuti   Hypercholesterolemia     Hypertension     Morbid obesity (HCC)     Myocardial infarction [facility])      mild x 3   Neuromuscular disorder (HCC)      neuropathy feet   Sleep apnea      cpap/             Past Surgical History:  Procedure Laterality Date   CARDIAC CATHETERIZATION   12/02/2012   CHOLECYSTECTOMY OPEN   09/04/1987   CORONARY ANGIOPLASTY WITH STENT PLACEMENT   09/03/2009   CORONARY/GRAFT ACUTE MI REVASCULARIZATION N/A 10/26/2019    Procedure: CORONARY/GRAFT ACUTE MI  REVASCULARIZATION;  Surgeon: Verlin Lonni BIRCH, MD;  Location: [facility] INVASIVE CV LAB;  Service: Cardiovascular;  Laterality: N/A;   INCISIONAL HERNIA REPAIR N/A 12/09/2021    Procedure: LAPAROSCOPIC REPAIR UMBILICAL HERNIA WITH MESH;  Surgeon: Vernetta Berg, MD;  Location: [facility] ORS;  Service: General;  Laterality: N/A;   IR IMAGING GUIDED PORT INSERTION   04/08/2024   LEFT HEART CATH AND CORONARY ANGIOGRAPHY N/A 10/26/2019    Procedure: LEFT HEART CATH AND CORONARY ANGIOGRAPHY;  Surgeon: Verlin Lonni BIRCH, MD;  Location: [facility] INVASIVE CV LAB;  Service: Cardiovascular;  Laterality: N/A;   LEFT HEART CATHETERIZATION WITH CORONARY ANGIOGRAM N/A 12/25/2012    Procedure: LEFT HEART CATHETERIZATION WITH CORONARY ANGIOGRAM;  Surgeon: Ozell Fell, MD;  Location: [facility] CATH LAB;  Service: Cardiovascular;  Laterality: N/A;   RADIOLOGY WITH ANESTHESIA N/A 04/21/2024    Procedure: MRI WITH ANESTHESIA;  Surgeon: Radiologist, Medication, MD;  Location: [facility] OR;  Service: Radiology;  Laterality: N/A;  BRAIN W/ W/O   ROBOTIC ASSISTED TOTAL HYSTERECTOMY WITH BILATERAL SALPINGO OOPHERECTOMY N/A 12/06/2021    Procedure: XI ROBOTIC ASSISTED TOTAL HYSTERECTOMY WITH BILATERAL SALPINGO OOPHORECTOMY;  Surgeon: [name] SAUNDERS, MD;  Location: [facility] ORS;  Service: Gynecology;  Laterality: N/A;   UMBILICAL HERNIA REPAIR   05/04/1989    doesn't think they used mesh             Family History  Problem Relation Age of Onset   Coronary artery disease Mother          in her mid 30s   Hypertension Mother     Cancer Mother          skin   Heart attack Father          in his mid 11s   COPD Father     Stroke Sister     Coronary artery disease Sister     Diabetes Sister     Other Half-Sister     Thyroid  cancer Daughter     Prostate cancer Neg Hx     Colon cancer Neg Hx     Breast cancer Neg Hx     Endometrial cancer Neg Hx     Ovarian cancer Neg Hx     Pancreatic cancer Neg Hx          Social History:  reports  that she quit smoking about 37 years ago. Her smoking use included cigarettes and cigars. She started smoking about 38 years ago. She has a 0.5 pack-year smoking history. She has never used smokeless tobacco. She reports that she does not drink alcohol and does not use drugs. Allergies:  Allergies       Allergies  Allergen Reactions   Hydrocodone Shortness Of Breath, Dermatitis and Other (See Comments)  I forget to breathe.   Clopidogrel Rash and Dermatitis            Medications Prior to Admission  Medication Sig Dispense Refill   acetaminophen  (TYLENOL ) 500 MG tablet Take 1-2 tablets (500-1,000 mg total) by mouth every 8 (eight) hours as needed for mild pain (pain score 1-3) or moderate pain (pain score 4-6).       atorvastatin  (LIPITOR ) 80 MG tablet Take 1 tablet (80 mg total) by mouth every evening. 30 tablet 6   buPROPion  (WELLBUTRIN  XL) 150 MG 24 hr tablet Take 150 mg by mouth in the morning.       Cholecalciferol  (VITAMIN D3 SUPER STRENGTH) 50 MCG (2000 UT) CAPS Take 2,000 Units by mouth in the morning.       FIASP  FLEXTOUCH 100 UNIT/ML FlexTouch Pen Inject 5 Units into the skin with breakfast, with lunch, and with evening meal.       FLUoxetine  (PROZAC ) 40 MG capsule Take 40 mg by mouth daily.       guaiFENesin  (MUCINEX ) 600 MG 12 hr tablet Take 1 tablet (600 mg total) by mouth 2 (two) times daily as needed for cough or to loosen phlegm. 30 tablet 0   insulin  degludec (TRESIBA) 100 UNIT/ML FlexTouch Pen Inject 24 Units into the skin every evening.       lactulose  (CHRONULAC ) 10 GM/15ML solution Take 45 mLs (30 g total) by mouth 2 (two) times daily as needed for mild constipation or moderate constipation. 236 mL 0   lidocaine  (LIDODERM ) 5 % Place 1 patch onto the skin daily.       losartan  (COZAAR ) 25 MG tablet Take 1 tablet (25 mg total) by mouth daily. 90 tablet 3   metFORMIN (GLUCOPHAGE-XR) 500 MG 24 hr tablet Take 1,000 mg by mouth in the morning and at bedtime.       Misc  Natural Products (NEURIVA PO) Take 1 tablet by mouth daily.       ondansetron  (ZOFRAN ) 8 MG tablet Take 1 tablet (8 mg total) by mouth every 8 (eight) hours as needed for nausea or vomiting. Start on the third day after carboplatin . 30 tablet 1   Oxycodone  HCl 10 MG TABS Take 1 tablet (10 mg total) by mouth every 6 (six) hours as needed. (Patient taking differently: Take 10 mg by mouth every 6 (six) hours as needed (pain).) 90 tablet 0   pregabalin  (LYRICA ) 50 MG capsule Take 1 capsule (50 mg total) by mouth 2 (two) times daily. (Patient taking differently: Take 50 mg by mouth 3 (three) times daily.) 60 capsule 0   prochlorperazine  (COMPAZINE ) 10 MG tablet Take 1 tablet (10 mg total) by mouth every 6 (six) hours as needed for nausea or vomiting. 30 tablet 1   senna-docusate (SENOKOT-S) 8.6-50 MG tablet Take 1-2 tablets by mouth 2 (two) times daily between meals as needed for mild constipation or moderate constipation. (Patient taking differently: Take 1-2 tablets by mouth as needed for mild constipation or moderate constipation.)       cyproheptadine  (PERIACTIN ) 4 MG tablet Take 4 mg by mouth 3 (three) times daily. (Patient not taking: Reported on 06/02/2024)       diphenhydrAMINE  (BENADRYL ) 25 mg capsule Take 25 mg by mouth at bedtime as needed for sleep. (Patient not taking: Reported on 06/02/2024)       lidocaine -prilocaine  (EMLA ) cream Apply to affected area once (Patient not taking: Reported on 06/02/2024) 30 g 3   Misc. Devices MISC Portable oxygen  concentrator,  2L oxygen .  Diagnoses-chronic respiratory failure with hypoxia 1 each 0   nitroGLYCERIN  (NITROSTAT ) 0.4 MG SL tablet Dissolve 1 tablet under the tongue every 5 minutes as needed for chest pain. Max of 3 doses, then 911. (Patient not taking: Reported on 06/02/2024) 25 tablet 6   OXYGEN  Inhale 2 L/min into the lungs continuous.       polyethylene glycol powder (GLYCOLAX /MIRALAX ) 17 GM/SCOOP powder Take 17 g by mouth daily as needed for mild  constipation. (Patient not taking: Reported on 06/02/2024)       prasugrel  (EFFIENT ) 5 MG TABS tablet Take 1 tablet (5 mg total) by mouth in the morning. AFTER TAKING YOUR DOSE ON 04/07/2024, STOP TAKING IT IN PREPARATION FOR THE BIOPSY ON 04/15/2024. AFTER THE BIOPSY, FOLLOW THE RADIOLOGIST RECOMMENDATIONS AS TO TIMING OF RESTARTING THIS MEDICINE. (Patient not taking: Reported on 06/02/2024)                  Home: Home Living Family/patient expects to be discharged to:: Private residence Living Arrangements: Children, Spouse/significant other Available Help at Discharge: Family, Available PRN/intermittently Type of Home: House Home Access: Ramped entrance Home Layout: One level Bathroom Shower/Tub: Engineer, manufacturing systems: Standard Bathroom Accessibility: Yes Home Equipment: Rollator (4 wheels), BSC/3in1, Shower seat, Grab bars - toilet, Grab bars - tub/shower (transport chair) Additional Comments: Pt has intermittently used rollator but does not want to use one; family uses transport w/c for community distances  Lives With: Spouse   Functional History: Prior Function Prior Level of Function : History of Falls (last six months), Independent/Modified Independent, Driving Mobility Comments: Recent falls, has used Rollator in past ADLs Comments: Pt has driven short distances a few times since early August. Manages her medications via pillbox.   Functional Status:  Mobility: Bed Mobility Overal bed mobility: Needs Assistance Bed Mobility: Supine to Sit Supine to sit: Supervision General bed mobility comments: Exited to the R side Transfers Overall transfer level: Needs assistance Equipment used: Rollator (4 wheels) Transfers: Sit to/from Stand Sit to Stand: Contact guard assist Bed to/from chair/wheelchair/BSC transfer type:: Step pivot Step pivot transfers: Contact guard assist General transfer comment: Stood from bed height & lower toilet height with cues for hand  placement. Cued to use grab bars to assist in bathroom. Ambulation/Gait Ambulation/Gait assistance: Contact guard assist, Min assist Gait Distance (Feet): 60 Feet (60, 60) Assistive device: Rollator (4 wheels) Gait Pattern/deviations: Step-through pattern, Decreased stride length General Gait Details: Pt with slow and steady pace with Rollator, one lateral LOB with turn and pivot towards left, requiring minA to correct. Pt able to verbalize need for seated rest break, but needs assist with  managing Rollator i.e. locking and unlocking brakes and parking Rollator Gait velocity: reduced Gait velocity interpretation: <1.8 ft/sec, indicate of risk for recurrent falls   ADL: ADL Overall ADL's : Needs assistance/impaired Grooming: Moderate assistance, Cueing for sequencing, Standing, Wash/dry hands, Oral care Grooming Details (indicate cue type and reason): cues for identifying grooming supplies & appropriate items on wall (light switch, paper towel & soap dispensers), pt attempting to turn faucet on by moving lightswitch and touching open outlet Lower Body Dressing: Moderate assistance, Cueing for sequencing, Sitting/lateral leans Lower Body Dressing Details (indicate cue type and reason): adjusting B socks Toilet Transfer: Minimal assistance, Ambulation, Regular Toilet Toilet Transfer Details (indicate cue type and reason): cues to safely approach toilet Functional mobility during ADLs: Minimal assistance, Cueing for safety, Cueing for sequencing   Cognition: Cognition Overall Cognitive Status: Impaired/Different from  baseline Arousal/Alertness: Awake/alert Orientation Level: Oriented X4 Year: 2022 Month: May Attention: Sustained Sustained Attention: Appears intact Memory: Impaired Memory Impairment: Retrieval deficit Awareness: Impaired Awareness Impairment: Emergent impairment Problem Solving: Impaired Problem Solving Impairment: Verbal basic, Functional basic Executive Function:  Reasoning Reasoning: Appears intact Cognition Arousal: Alert Behavior During Therapy: WFL for tasks assessed/performed Overall Cognitive Status: Impaired/Different from baseline   Physical Exam: Blood pressure (!) 116/51, pulse 61, temperature 98.9 F (37.2 C), resp. rate 19, height 5' 2 (1.575 m), weight 85 kg, last menstrual period 10/30/2014, SpO2 99%. Physical Exam   General: No apparent distress, obese HEENT: Head is normocephalic, atraumatic, sclera anicteric, oral mucosa a little dry  Heart: Reg rate and rhythm. No murmurs rubs or gallops Chest: CTA bilaterally without wheezes, rales, or rhonchi; no distress, 2L [place] Abdomen: Soft, non-tender, non-distended, bowel sounds positive. Extremities: No clubbing, cyanosis, or edema. Pulses are 2+ Psych: [name] affect is appropriate. Pt is cooperative, little anxious Skin: Clean and intact without signs of breakdown on visible portion Neuro:     Mental Status: AAOx to month, day not year- able to correct with cue, oriented to person and place.  Occasional word finding deficits.  Occasional delayed responses.  Occasional memory deficits. Speech/Languate: Naming and repetition intact, fluent, follows simple commands CRANIAL NERVES: II: L eye pupil dilated since eye surgery-chronic, appears to have some  mild decreased visual field on the left side III, IV, VI: EOM intact, no gaze preference or deviation V: normal sensation bilaterally VII: no asymmetry VIII: normal hearing to speech IX, X: normal palatal elevation XI: 5/5 head turn and 5/5 shoulder shrug bilaterally XII: Tongue midline     MOTOR: RUE: 4+/5 Deltoid, 4+/5 Biceps, 4+/5 Triceps,4+/5 Grip LUE: 4+/5 Deltoid, 4+/5 Biceps, 4+/5 Triceps, 4+/5 Grip RLE: HF 4/5, KE 4+/5, ADF 4+/5, APF 4+/5 LLE: HF 4-/5, KE 4-/5, ADF 4/5, APF 4/5   SENSORY: Normal to touch all 4 extremities   No hypertonia noted   Coordination: Normal finger to nose intact bilaterally       Lab  Results Last 48 Hours        Results for orders placed or performed during the hospital encounter of 05/30/24 (from the past 48 hours)  Glucose, capillary     Status: Abnormal    Collection Time: 06/01/24 11:54 AM  Result Value Ref Range    Glucose-Capillary 238 (H) 70 - 99 mg/dL      Comment: Glucose reference range applies only to samples taken after fasting for at least 8 hours.    Comment 1 Notify RN    Glucose, capillary     Status: Abnormal    Collection Time: 06/01/24  3:36 PM  Result Value Ref Range    Glucose-Capillary 239 (H) 70 - 99 mg/dL      Comment: Glucose reference range applies only to samples taken after fasting for at least 8 hours.    Comment 1 Notify RN    Glucose, capillary     Status: Abnormal    Collection Time: 06/01/24  9:18 PM  Result Value Ref Range    Glucose-Capillary 243 (H) 70 - 99 mg/dL      Comment: Glucose reference range applies only to samples taken after fasting for at least 8 hours.    Comment 1 Notify RN    Glucose, capillary     Status: Abnormal    Collection Time: 06/02/24  6:11 AM  Result Value Ref Range    Glucose-Capillary 244 (H) 70 -  99 mg/dL      Comment: Glucose reference range applies only to samples taken after fasting for at least 8 hours.    Comment 1 Notify RN    Glucose, capillary     Status: Abnormal    Collection Time: 06/02/24 12:02 PM  Result Value Ref Range    Glucose-Capillary 252 (H) 70 - 99 mg/dL      Comment: Glucose reference range applies only to samples taken after fasting for at least 8 hours.  Glucose, capillary     Status: Abnormal    Collection Time: 06/02/24  3:59 PM  Result Value Ref Range    Glucose-Capillary 191 (H) 70 - 99 mg/dL      Comment: Glucose reference range applies only to samples taken after fasting for at least 8 hours.  Glucose, capillary     Status: Abnormal    Collection Time: 06/02/24  9:33 PM  Result Value Ref Range    Glucose-Capillary 131 (H) 70 - 99 mg/dL      Comment: Glucose  reference range applies only to samples taken after fasting for at least 8 hours.  Basic metabolic panel with GFR     Status: Abnormal    Collection Time: 06/03/24  5:58 AM  Result Value Ref Range    Sodium 136 135 - 145 mmol/L    Potassium 4.0 3.5 - 5.1 mmol/L    Chloride 100 98 - 111 mmol/L    CO2 26 22 - 32 mmol/L    Glucose, Bld 139 (H) 70 - 99 mg/dL      Comment: Glucose reference range applies only to samples taken after fasting for at least 8 hours.    BUN 8 8 - 23 mg/dL    Creatinine, Ser 9.32 0.44 - 1.00 mg/dL    Calcium  8.6 (L) 8.9 - 10.3 mg/dL    GFR, Estimated >39 >39 mL/min      Comment: (NOTE) Calculated using the CKD-EPI Creatinine Equation (2021)      Anion gap 10 5 - 15      Comment: Performed at [facility], [address], [place], [place] [address]  CBC with Differential/Platelet     Status: Abnormal    Collection Time: 06/03/24  5:58 AM  Result Value Ref Range    WBC 5.9 4.0 - 10.5 K/uL    RBC 2.82 (L) 3.87 - 5.11 MIL/uL    Hemoglobin 7.8 (L) 12.0 - 15.0 g/dL    HCT 74.7 (L) 63.9 - 46.0 %    MCV 89.4 80.0 - 100.0 fL    MCH 27.7 26.0 - 34.0 pg    MCHC 31.0 30.0 - 36.0 g/dL    RDW 81.1 (H) 88.4 - 15.5 %    Platelets 228 150 - 400 K/uL    nRBC 0.0 0.0 - 0.2 %    Neutrophils Relative % 61 %    Neutro Abs 3.7 1.7 - 7.7 K/uL    Lymphocytes Relative 26 %    Lymphs Abs 1.5 0.7 - 4.0 K/uL    Monocytes Relative 9 %    Monocytes Absolute 0.5 0.1 - 1.0 K/uL    Eosinophils Relative 3 %    Eosinophils Absolute 0.2 0.0 - 0.5 K/uL    Basophils Relative 1 %    Basophils Absolute 0.0 0.0 - 0.1 K/uL    Immature Granulocytes 0 %    Abs Immature Granulocytes 0.02 0.00 - 0.07 K/uL      Comment: Performed at [facility], [address], [place], [place] [address]  Glucose, capillary     Status: Abnormal    Collection Time: 06/03/24  6:39 AM  Result Value Ref Range    Glucose-Capillary 128 (H) 70 - 99 mg/dL      Comment: Glucose reference range applies only  to samples taken after fasting for at least 8 hours.    Comment 1 Notify RN      Comment 2 Document in Chart         Imaging Results (Last 48 hours)  VAS US  LOWER EXTREMITY VENOUS (DVT) Result Date: 06/02/2024  Lower Venous DVT Study Patient Name:  SHEALEE YORDY  Date of Exam:   06/02/2024 Medical Rec #: [id number]            Accession #:    [id number] Date of Birth: 01-05-1961            Patient Gender: F Patient Age:   78 years Exam Location:  [facility] Procedure:      VAS US  LOWER EXTREMITY VENOUS (DVT) Referring Phys: ARY XU --------------------------------------------------------------------------------  Indications: Stroke.  Risk Factors: Metastatic cancer, chemotherapy. Comparison Study: Previous exam on 02/09/2021 was negative for DVT Performing Technologist: Ezzie Potters RVT, RDMS  Examination Guidelines: A complete evaluation includes B-mode imaging, spectral Doppler, color Doppler, and power Doppler as needed of all accessible portions of each vessel. Bilateral testing is considered an integral part of a complete examination. Limited examinations for reoccurring indications may be performed as noted. The reflux portion of the exam is performed with the patient in reverse Trendelenburg.  +---------+---------------+---------+-----------+----------+-------------------+ RIGHT    CompressibilityPhasicitySpontaneityPropertiesThrombus Aging      +---------+---------------+---------+-----------+----------+-------------------+ CFV      Full           Yes      Yes                                      +---------+---------------+---------+-----------+----------+-------------------+ SFJ      Full                                                             +---------+---------------+---------+-----------+----------+-------------------+ FV Prox  Full           Yes      Yes                                       +---------+---------------+---------+-----------+----------+-------------------+ FV Mid   Full           Yes      Yes                                      +---------+---------------+---------+-----------+----------+-------------------+ FV DistalFull           Yes      Yes                                      +---------+---------------+---------+-----------+----------+-------------------+ PFV  Full                                                             +---------+---------------+---------+-----------+----------+-------------------+ POP      Full           Yes      Yes                                      +---------+---------------+---------+-----------+----------+-------------------+ PTV      Full                                                             +---------+---------------+---------+-----------+----------+-------------------+ PERO     None           No       No                   Acute one of paired +---------+---------------+---------+-----------+----------+-------------------+   +---------+---------------+---------+-----------+----------+--------------+ LEFT     CompressibilityPhasicitySpontaneityPropertiesThrombus Aging +---------+---------------+---------+-----------+----------+--------------+ CFV      Full           Yes      Yes                                 +---------+---------------+---------+-----------+----------+--------------+ SFJ      Full                                                        +---------+---------------+---------+-----------+----------+--------------+ FV Prox  Full           Yes      Yes                                 +---------+---------------+---------+-----------+----------+--------------+ FV Mid   Full           Yes      Yes                                 +---------+---------------+---------+-----------+----------+--------------+ FV DistalFull           Yes      Yes                                  +---------+---------------+---------+-----------+----------+--------------+ PFV      Full                                                        +---------+---------------+---------+-----------+----------+--------------+ POP  Full           Yes      Yes                                 +---------+---------------+---------+-----------+----------+--------------+ PTV      Full                                                        +---------+---------------+---------+-----------+----------+--------------+ PERO     Full                                                        +---------+---------------+---------+-----------+----------+--------------+     Summary: BILATERAL: -No evidence of popliteal cyst, bilaterally. RIGHT: - Findings consistent with acute deep vein thrombosis involving the right peroneal veins.   LEFT: - There is no evidence of deep vein thrombosis in the lower extremity.  *See table(s) above for measurements and observations. Electronically signed by Debby Robertson on 06/02/2024 at 4:10:55 PM.    Final            Blood pressure (!) 116/51, pulse 61, temperature 98.9 F (37.2 C), resp. rate 19, height 5' 2 (1.575 m), weight 85 kg, last menstrual period 10/30/2014, SpO2 99%.   Medical Problem List and Plan: 1. 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  Antithrombotics: -DVT/anticoagulation:  Mechanical: Sequential compression devices, below knee Bilateral lower extremities Pharmaceutical: Eliquis 5 mg BID              -antiplatelet therapy: N/A 3. Pain Management: History of chronic pain syndrome on scheduled Lyrica  50 mg 3 times daily and oxycodone  10 mg as needed.          Monitor for any adverse  effects on mentation. 4. Mood/Behavior/Sleep: LCSW to follow for evaluation and support when available.              -antipsychotic agents: Prozac --- holding Wellbutrin  XL  5. Neuropsych/cognition: This patient may be capable of making decisions on her own behalf. 6. Skin/Wound Care: Routine pressure relief measures.  7. Fluids/Electrolytes/Nutrition: Monitor intake and output. 8.  DVT: LE venous Doppler positive for right peroneal vein acute DVT on Effient  PTA now on Eliquis 5 mg twice daily.  9. T2DM: A1c 9.2 uncontrolled diabetes.  On home metformin 1000 mg BID -Monitor CBG ACHS with SSI.  Lantus  24 units - Change diet, carb modified.  10. HLD: LDL 40, Lipitor  80 mg daily 11.  CAD: NSTEMI s/p stenting cardiology agreeable with Eliquis alone 12.  Uterine CA: Followed by Dr. Mellie. Received 1 cycle of treatment with chemo-Keytruda  Taxol  carboplatin .  Repeat cycle wad due 06/02/24.  13.  Chronic respiratory failure: Wears 2L at baseline and CPAP  at night.  Continue guaifenesin  600 mg twice daily             - Incentive spirometer 14.  HTN: BP stable.  Long-term goal normotensive 15.  Obesity: BMI 34.27, educate on diet and weight loss to promote overall health and mobility.  16. Intertrigo. Nystatin cream 17.  OSA. on CPAP at home     Daphne LITTIE Satterfield, NP 06/03/2024     I have personally performed a face to face diagnostic evaluation of this patient and formulated the key components of the plan.  Additionally, I have personally reviewed laboratory data, imaging studies, as well as relevant notes and concur with the physician assistant's documentation above.   The patient's status has not changed from the original H&P.  Any changes in documentation from the acute care chart have been noted above.   Murray Collier, MD

## 2024-06-03 NOTE — H&P (Signed)
 Physical Medicine and Rehabilitation Admission H&P    Chief Complaint  Patient presents with   Functional deficits due to CVA  : HPI: Brenda [name] is a 63 year old female with a complex medical history, including hypertension, stage IV metastatic uterine cancer, type two diabetes, obstructive sleep apnea on chronic CPAP and oxygen , chronic respiratory failure, and CAD/NSTEMI status post stenting (10/2019) on Effient  and Lipitor  80 mg. Allergic to Plavix. Patient presented to [facility] via EMS on 05/31/24 d/t mild confusion and occasional word finding difficulty, noted to be baseline since chemotherapy.  A CT of the head was concerning for an acute to subacute infarct in the right temporal and occipital lobe. A CTA of the head and neck was previously unremarkable. MRI showed embolic infarcts and patient was transferred to [facility] for further workup. Neurology consulted and patient was placed on aspirin , this was later switched to Eliquis as CVA was felt likely due to hypercoagulable state from metastatic cancer.  She is followed by Dr. Mellie for metastatic uterine cancer dx in 2023 (s/p surgery and radiation). Patient with severe back pain 03/2024, CT showed recurrent disease in the retroperitoneal lymph node. CT chest showed lung mass suspicious for metastatic lesion. Pathology confirmed left retroperitoneal mass to be adenocarcinoma.  On 9/28 abnormal EEG due to mild diffuse slowing from global cerebral dysfunction.  Echo EF 55-60% grade 2 dysfunction with mild aortic valve stenosis.  Venous Doppler positive for right peroneal vein acute DVT on 9/30, likely related to hypercoagulable state.  Patient reports that as a result of her cancer she has back pain with radiation down her left leg. Per chart review the patient lives at home with her spouse in a 1 level house with ramped entry.  She was independent and driving prior to arrival, there is history of some recent falls.  She  currently requires CGA for sit to stand and ambulate 60 feet at min assist to CGA with rollator. Therapy evaluations completed due to patient decreased functional mobility was admitted for a comprehensive rehab program.   Review of Systems  Constitutional:  Negative for chills and fever.  HENT:  Negative for hearing loss.   Eyes:  Negative for blurred vision and double vision.       Difficulty seeing to left, hx eye surgery  Respiratory:  Negative for cough and shortness of breath.        SOB with a lot of activity  Cardiovascular:  Negative for chest pain.  Gastrointestinal:  Positive for abdominal pain. Negative for constipation, diarrhea and vomiting.  Genitourinary: Negative.   Musculoskeletal:  Positive for back pain. Negative for joint pain and neck pain.       Pain radiating down left thigh  Skin:  Positive for rash (yeast).   Past Medical History:  Diagnosis Date   Anemia    Arthritis    back, hands, hips (02/22/2015)   CAD (coronary artery disease)    a. complex LAD/diagonal bifurcation PCI in 2010. b. STEMI 10/2019 s/p PTCA/DES x1 to mLAD overlapping the old stent, residual disease treated medicaly.   Cancer [facility])    uterine   CHF (congestive heart failure) (HCC)    Depression    Diabetes mellitus (HCC)    started when I was pregnant; not sure if it was type 1 or type 2    History of radiation therapy    Endometrium- HDR 01/22/22-03/07/22- Dr. Lynwood Nasuti   Hypercholesterolemia    Hypertension  Morbid obesity (HCC)    Myocardial infarction (HCC)    mild x 3   Neuromuscular disorder (HCC)    neuropathy feet   Sleep apnea    cpap/   Past Surgical History:  Procedure Laterality Date   CARDIAC CATHETERIZATION  12/02/2012   CHOLECYSTECTOMY OPEN  09/04/1987   CORONARY ANGIOPLASTY WITH STENT PLACEMENT  09/03/2009   CORONARY/GRAFT ACUTE MI REVASCULARIZATION N/A 10/26/2019   Procedure: CORONARY/GRAFT ACUTE MI REVASCULARIZATION;  Surgeon: Verlin Lonni BIRCH,  MD;  Location: [facility] INVASIVE CV LAB;  Service: Cardiovascular;  Laterality: N/A;   INCISIONAL HERNIA REPAIR N/A 12/09/2021   Procedure: LAPAROSCOPIC REPAIR UMBILICAL HERNIA WITH MESH;  Surgeon: Vernetta Berg, MD;  Location: [facility] ORS;  Service: General;  Laterality: N/A;   IR IMAGING GUIDED PORT INSERTION  04/08/2024   LEFT HEART CATH AND CORONARY ANGIOGRAPHY N/A 10/26/2019   Procedure: LEFT HEART CATH AND CORONARY ANGIOGRAPHY;  Surgeon: Verlin Lonni BIRCH, MD;  Location: [facility] INVASIVE CV LAB;  Service: Cardiovascular;  Laterality: N/A;   LEFT HEART CATHETERIZATION WITH CORONARY ANGIOGRAM N/A 12/25/2012   Procedure: LEFT HEART CATHETERIZATION WITH CORONARY ANGIOGRAM;  Surgeon: Ozell Fell, MD;  Location: [facility] CATH LAB;  Service: Cardiovascular;  Laterality: N/A;   RADIOLOGY WITH ANESTHESIA N/A 04/21/2024   Procedure: MRI WITH ANESTHESIA;  Surgeon: Radiologist, Medication, MD;  Location: [facility] OR;  Service: Radiology;  Laterality: N/A;  BRAIN W/ W/O   ROBOTIC ASSISTED TOTAL HYSTERECTOMY WITH BILATERAL SALPINGO OOPHERECTOMY N/A 12/06/2021   Procedure: XI ROBOTIC ASSISTED TOTAL HYSTERECTOMY WITH BILATERAL SALPINGO OOPHORECTOMY;  Surgeon: [name] SAUNDERS, MD;  Location: [facility] ORS;  Service: Gynecology;  Laterality: N/A;   UMBILICAL HERNIA REPAIR  05/04/1989   doesn't think they used mesh   Family History  Problem Relation Age of Onset   Coronary artery disease Mother        in her mid 75s   Hypertension Mother    Cancer Mother        skin   Heart attack Father        in his mid 24s   COPD Father    Stroke Sister    Coronary artery disease Sister    Diabetes Sister    Other Half-Sister    Thyroid  cancer Daughter    Prostate cancer Neg Hx    Colon cancer Neg Hx    Breast cancer Neg Hx    Endometrial cancer Neg Hx    Ovarian cancer Neg Hx    Pancreatic cancer Neg Hx    Social History:  reports that she quit smoking about 37 years ago. Her smoking use included cigarettes and cigars. She  started smoking about 38 years ago. She has a 0.5 pack-year smoking history. She has never used smokeless tobacco. She reports that she does not drink alcohol and does not use drugs. Allergies:  Allergies  Allergen Reactions   Hydrocodone Shortness Of Breath, Dermatitis and Other (See Comments)    I forget to breathe.   Clopidogrel Rash and Dermatitis   Medications Prior to Admission  Medication Sig Dispense Refill   acetaminophen  (TYLENOL ) 500 MG tablet Take 1-2 tablets (500-1,000 mg total) by mouth every 8 (eight) hours as needed for mild pain (pain score 1-3) or moderate pain (pain score 4-6).     atorvastatin  (LIPITOR ) 80 MG tablet Take 1 tablet (80 mg total) by mouth every evening. 30 tablet 6   buPROPion  (WELLBUTRIN  XL) 150 MG 24 hr tablet Take 150 mg by mouth in the morning.  Cholecalciferol  (VITAMIN D3 SUPER STRENGTH) 50 MCG (2000 UT) CAPS Take 2,000 Units by mouth in the morning.     FIASP  FLEXTOUCH 100 UNIT/ML FlexTouch Pen Inject 5 Units into the skin with breakfast, with lunch, and with evening meal.     FLUoxetine  (PROZAC ) 40 MG capsule Take 40 mg by mouth daily.     guaiFENesin  (MUCINEX ) 600 MG 12 hr tablet Take 1 tablet (600 mg total) by mouth 2 (two) times daily as needed for cough or to loosen phlegm. 30 tablet 0   insulin  degludec (TRESIBA) 100 UNIT/ML FlexTouch Pen Inject 24 Units into the skin every evening.     lactulose  (CHRONULAC ) 10 GM/15ML solution Take 45 mLs (30 g total) by mouth 2 (two) times daily as needed for mild constipation or moderate constipation. 236 mL 0   lidocaine  (LIDODERM ) 5 % Place 1 patch onto the skin daily.     losartan  (COZAAR ) 25 MG tablet Take 1 tablet (25 mg total) by mouth daily. 90 tablet 3   metFORMIN (GLUCOPHAGE-XR) 500 MG 24 hr tablet Take 1,000 mg by mouth in the morning and at bedtime.     Misc Natural Products (NEURIVA PO) Take 1 tablet by mouth daily.     ondansetron  (ZOFRAN ) 8 MG tablet Take 1 tablet (8 mg total) by mouth every 8  (eight) hours as needed for nausea or vomiting. Start on the third day after carboplatin . 30 tablet 1   Oxycodone  HCl 10 MG TABS Take 1 tablet (10 mg total) by mouth every 6 (six) hours as needed. (Patient taking differently: Take 10 mg by mouth every 6 (six) hours as needed (pain).) 90 tablet 0   pregabalin  (LYRICA ) 50 MG capsule Take 1 capsule (50 mg total) by mouth 2 (two) times daily. (Patient taking differently: Take 50 mg by mouth 3 (three) times daily.) 60 capsule 0   prochlorperazine  (COMPAZINE ) 10 MG tablet Take 1 tablet (10 mg total) by mouth every 6 (six) hours as needed for nausea or vomiting. 30 tablet 1   senna-docusate (SENOKOT-S) 8.6-50 MG tablet Take 1-2 tablets by mouth 2 (two) times daily between meals as needed for mild constipation or moderate constipation. (Patient taking differently: Take 1-2 tablets by mouth as needed for mild constipation or moderate constipation.)     cyproheptadine  (PERIACTIN ) 4 MG tablet Take 4 mg by mouth 3 (three) times daily. (Patient not taking: Reported on 06/02/2024)     diphenhydrAMINE  (BENADRYL ) 25 mg capsule Take 25 mg by mouth at bedtime as needed for sleep. (Patient not taking: Reported on 06/02/2024)     lidocaine -prilocaine  (EMLA ) cream Apply to affected area once (Patient not taking: Reported on 06/02/2024) 30 g 3   Misc. Devices MISC Portable oxygen  concentrator, 2L oxygen .  Diagnoses-chronic respiratory failure with hypoxia 1 each 0   nitroGLYCERIN  (NITROSTAT ) 0.4 MG SL tablet Dissolve 1 tablet under the tongue every 5 minutes as needed for chest pain. Max of 3 doses, then 911. (Patient not taking: Reported on 06/02/2024) 25 tablet 6   OXYGEN  Inhale 2 L/min into the lungs continuous.     polyethylene glycol powder (GLYCOLAX /MIRALAX ) 17 GM/SCOOP powder Take 17 g by mouth daily as needed for mild constipation. (Patient not taking: Reported on 06/02/2024)     prasugrel  (EFFIENT ) 5 MG TABS tablet Take 1 tablet (5 mg total) by mouth in the morning. AFTER  TAKING YOUR DOSE ON 04/07/2024, STOP TAKING IT IN PREPARATION FOR THE BIOPSY ON 04/15/2024. AFTER THE BIOPSY, FOLLOW THE RADIOLOGIST RECOMMENDATIONS  AS TO TIMING OF RESTARTING THIS MEDICINE. (Patient not taking: Reported on 06/02/2024)        Home: Home Living Family/patient expects to be discharged to:: Private residence Living Arrangements: Children, Spouse/significant other Available Help at Discharge: Family, Available PRN/intermittently Type of Home: House Home Access: Ramped entrance Home Layout: One level Bathroom Shower/Tub: Engineer, manufacturing systems: Standard Bathroom Accessibility: Yes Home Equipment: Rollator (4 wheels), BSC/3in1, Shower seat, Grab bars - toilet, Grab bars - tub/shower (transport chair) Additional Comments: Pt has intermittently used rollator but does not want to use one; family uses transport w/c for community distances  Lives With: Spouse   Functional History: Prior Function Prior Level of Function : History of Falls (last six months), Independent/Modified Independent, Driving Mobility Comments: Recent falls, has used Rollator in past ADLs Comments: Pt has driven short distances a few times since early August. Manages her medications via pillbox.  Functional Status:  Mobility: Bed Mobility Overal bed mobility: Needs Assistance Bed Mobility: Supine to Sit Supine to sit: Supervision General bed mobility comments: Exited to the R side Transfers Overall transfer level: Needs assistance Equipment used: Rollator (4 wheels) Transfers: Sit to/from Stand Sit to Stand: Contact guard assist Bed to/from chair/wheelchair/BSC transfer type:: Step pivot Step pivot transfers: Contact guard assist General transfer comment: Stood from bed height & lower toilet height with cues for hand placement. Cued to use grab bars to assist in bathroom. Ambulation/Gait Ambulation/Gait assistance: Contact guard assist, Min assist Gait Distance (Feet): 60 Feet (60,  60) Assistive device: Rollator (4 wheels) Gait Pattern/deviations: Step-through pattern, Decreased stride length General Gait Details: Pt with slow and steady pace with Rollator, one lateral LOB with turn and pivot towards left, requiring minA to correct. Pt able to verbalize need for seated rest break, but needs assist with  managing Rollator i.e. locking and unlocking brakes and parking Rollator Gait velocity: reduced Gait velocity interpretation: <1.8 ft/sec, indicate of risk for recurrent falls    ADL: ADL Overall ADL's : Needs assistance/impaired Grooming: Moderate assistance, Cueing for sequencing, Standing, Wash/dry hands, Oral care Grooming Details (indicate cue type and reason): cues for identifying grooming supplies & appropriate items on wall (light switch, paper towel & soap dispensers), pt attempting to turn faucet on by moving lightswitch and touching open outlet Lower Body Dressing: Moderate assistance, Cueing for sequencing, Sitting/lateral leans Lower Body Dressing Details (indicate cue type and reason): adjusting B socks Toilet Transfer: Minimal assistance, Ambulation, Regular Toilet Toilet Transfer Details (indicate cue type and reason): cues to safely approach toilet Functional mobility during ADLs: Minimal assistance, Cueing for safety, Cueing for sequencing  Cognition: Cognition Overall Cognitive Status: Impaired/Different from baseline Arousal/Alertness: Awake/alert Orientation Level: Oriented X4 Year: 2022 Month: May Attention: Sustained Sustained Attention: Appears intact Memory: Impaired Memory Impairment: Retrieval deficit Awareness: Impaired Awareness Impairment: Emergent impairment Problem Solving: Impaired Problem Solving Impairment: Verbal basic, Functional basic Executive Function: Reasoning Reasoning: Appears intact Cognition Arousal: Alert Behavior During Therapy: WFL for tasks assessed/performed Overall Cognitive Status: Impaired/Different  from baseline  Physical Exam: Blood pressure (!) 116/51, pulse 61, temperature 98.9 F (37.2 C), resp. rate 19, height 5' 2 (1.575 m), weight 85 kg, last menstrual period 10/30/2014, SpO2 99%. Physical Exam  General: No apparent distress, obese HEENT: Head is normocephalic, atraumatic, sclera anicteric, oral mucosa a little dry  Heart: Reg rate and rhythm. No murmurs rubs or gallops Chest: CTA bilaterally without wheezes, rales, or rhonchi; no distress, 2L Lindsay Abdomen: Soft, non-tender, non-distended, bowel sounds positive. Extremities: No clubbing,  cyanosis, or edema. Pulses are 2+ Psych: [name] affect is appropriate. Pt is cooperative, little anxious Skin: Clean and intact without signs of breakdown on visible portion Neuro:    Mental Status: AAOx to month, day not year- able to correct with cue, oriented to person and place.  Occasional word finding deficits.  Occasional delayed responses.  Occasional memory deficits. Speech/Languate: Naming and repetition intact, fluent, follows simple commands CRANIAL NERVES: II: L eye pupil dilated since eye surgery-chronic, appears to have some  mild decreased visual field on the left side III, IV, VI: EOM intact, no gaze preference or deviation V: normal sensation bilaterally VII: no asymmetry VIII: normal hearing to speech IX, X: normal palatal elevation XI: 5/5 head turn and 5/5 shoulder shrug bilaterally XII: Tongue midline   MOTOR: RUE: 4+/5 Deltoid, 4+/5 Biceps, 4+/5 Triceps,4+/5 Grip LUE: 4+/5 Deltoid, 4+/5 Biceps, 4+/5 Triceps, 4+/5 Grip RLE: HF 4/5, KE 4+/5, ADF 4+/5, APF 4+/5 LLE: HF 4-/5, KE 4-/5, ADF 4/5, APF 4/5  SENSORY: Normal to touch all 4 extremities  No hypertonia noted  Coordination: Normal finger to nose intact bilaterally    Results for orders placed or performed during the hospital encounter of 05/30/24 (from the past 48 hours)  Glucose, capillary     Status: Abnormal   Collection Time: 06/01/24 11:54 AM   Result Value Ref Range   Glucose-Capillary 238 (H) 70 - 99 mg/dL    Comment: Glucose reference range applies only to samples taken after fasting for at least 8 hours.   Comment 1 Notify RN   Glucose, capillary     Status: Abnormal   Collection Time: 06/01/24  3:36 PM  Result Value Ref Range   Glucose-Capillary 239 (H) 70 - 99 mg/dL    Comment: Glucose reference range applies only to samples taken after fasting for at least 8 hours.   Comment 1 Notify RN   Glucose, capillary     Status: Abnormal   Collection Time: 06/01/24  9:18 PM  Result Value Ref Range   Glucose-Capillary 243 (H) 70 - 99 mg/dL    Comment: Glucose reference range applies only to samples taken after fasting for at least 8 hours.   Comment 1 Notify RN   Glucose, capillary     Status: Abnormal   Collection Time: 06/02/24  6:11 AM  Result Value Ref Range   Glucose-Capillary 244 (H) 70 - 99 mg/dL    Comment: Glucose reference range applies only to samples taken after fasting for at least 8 hours.   Comment 1 Notify RN   Glucose, capillary     Status: Abnormal   Collection Time: 06/02/24 12:02 PM  Result Value Ref Range   Glucose-Capillary 252 (H) 70 - 99 mg/dL    Comment: Glucose reference range applies only to samples taken after fasting for at least 8 hours.  Glucose, capillary     Status: Abnormal   Collection Time: 06/02/24  3:59 PM  Result Value Ref Range   Glucose-Capillary 191 (H) 70 - 99 mg/dL    Comment: Glucose reference range applies only to samples taken after fasting for at least 8 hours.  Glucose, capillary     Status: Abnormal   Collection Time: 06/02/24  9:33 PM  Result Value Ref Range   Glucose-Capillary 131 (H) 70 - 99 mg/dL    Comment: Glucose reference range applies only to samples taken after fasting for at least 8 hours.  Basic metabolic panel with GFR     Status: Abnormal  Collection Time: 06/03/24  5:58 AM  Result Value Ref Range   Sodium 136 135 - 145 mmol/L   Potassium 4.0 3.5 - 5.1  mmol/L   Chloride 100 98 - 111 mmol/L   CO2 26 22 - 32 mmol/L   Glucose, Bld 139 (H) 70 - 99 mg/dL    Comment: Glucose reference range applies only to samples taken after fasting for at least 8 hours.   BUN 8 8 - 23 mg/dL   Creatinine, Ser 9.32 0.44 - 1.00 mg/dL   Calcium  8.6 (L) 8.9 - 10.3 mg/dL   GFR, Estimated >39 >39 mL/min    Comment: (NOTE) Calculated using the CKD-EPI Creatinine Equation (2021)    Anion gap 10 5 - 15    Comment: Performed at [facility], [address] [place], [place] [address]  CBC with Differential/Platelet     Status: Abnormal   Collection Time: 06/03/24  5:58 AM  Result Value Ref Range   WBC 5.9 4.0 - 10.5 K/uL   RBC 2.82 (L) 3.87 - 5.11 MIL/uL   Hemoglobin 7.8 (L) 12.0 - 15.0 g/dL   HCT 74.7 (L) 63.9 - 53.9 %   MCV 89.4 80.0 - 100.0 fL   MCH 27.7 26.0 - 34.0 pg   MCHC 31.0 30.0 - 36.0 g/dL   RDW 81.1 (H) 88.4 - 84.4 %   Platelets 228 150 - 400 K/uL   nRBC 0.0 0.0 - 0.2 %   Neutrophils Relative % 61 %   Neutro Abs 3.7 1.7 - 7.7 K/uL   Lymphocytes Relative 26 %   Lymphs Abs 1.5 0.7 - 4.0 K/uL   Monocytes Relative 9 %   Monocytes Absolute 0.5 0.1 - 1.0 K/uL   Eosinophils Relative 3 %   Eosinophils Absolute 0.2 0.0 - 0.5 K/uL   Basophils Relative 1 %   Basophils Absolute 0.0 0.0 - 0.1 K/uL   Immature Granulocytes 0 %   Abs Immature Granulocytes 0.02 0.00 - 0.07 K/uL    Comment: Performed at [facility], [address] [place], [place] [address]  Glucose, capillary     Status: Abnormal   Collection Time: 06/03/24  6:39 AM  Result Value Ref Range   Glucose-Capillary 128 (H) 70 - 99 mg/dL    Comment: Glucose reference range applies only to samples taken after fasting for at least 8 hours.   Comment 1 Notify RN    Comment 2 Document in Chart    VAS US  LOWER EXTREMITY VENOUS (DVT) Result Date: 06/02/2024  Lower Venous DVT Study Patient Name:  Brenda [name]  Date of Exam:   06/02/2024 Medical Rec #: [id number]             Accession #:    [id number] Date of Birth: 09-Dec-1960            Patient Gender: F Patient Age:   66 years Exam Location:  [facility] Procedure:      VAS US  LOWER EXTREMITY VENOUS (DVT) Referring Phys: ARY XU --------------------------------------------------------------------------------  Indications: Stroke.  Risk Factors: Metastatic cancer, chemotherapy. Comparison Study: Previous exam on 02/09/2021 was negative for DVT Performing Technologist: Ezzie Potters RVT, RDMS  Examination Guidelines: A complete evaluation includes B-mode imaging, spectral Doppler, color Doppler, and power Doppler as needed of all accessible portions of each vessel. Bilateral testing is considered an integral part of a complete examination. Limited examinations for reoccurring indications may be performed as noted. The reflux portion of the exam  is performed with the patient in reverse Trendelenburg.  +---------+---------------+---------+-----------+----------+-------------------+ RIGHT    CompressibilityPhasicitySpontaneityPropertiesThrombus Aging      +---------+---------------+---------+-----------+----------+-------------------+ CFV      Full           Yes      Yes                                      +---------+---------------+---------+-----------+----------+-------------------+ SFJ      Full                                                             +---------+---------------+---------+-----------+----------+-------------------+ FV Prox  Full           Yes      Yes                                      +---------+---------------+---------+-----------+----------+-------------------+ FV Mid   Full           Yes      Yes                                      +---------+---------------+---------+-----------+----------+-------------------+ FV DistalFull           Yes      Yes                                      +---------+---------------+---------+-----------+----------+-------------------+  PFV      Full                                                             +---------+---------------+---------+-----------+----------+-------------------+ POP      Full           Yes      Yes                                      +---------+---------------+---------+-----------+----------+-------------------+ PTV      Full                                                             +---------+---------------+---------+-----------+----------+-------------------+ PERO     None           No       No                   Acute one of paired +---------+---------------+---------+-----------+----------+-------------------+   +---------+---------------+---------+-----------+----------+--------------+ LEFT     CompressibilityPhasicitySpontaneityPropertiesThrombus Aging +---------+---------------+---------+-----------+----------+--------------+ CFV      Full  Yes      Yes                                 +---------+---------------+---------+-----------+----------+--------------+ SFJ      Full                                                        +---------+---------------+---------+-----------+----------+--------------+ FV Prox  Full           Yes      Yes                                 +---------+---------------+---------+-----------+----------+--------------+ FV Mid   Full           Yes      Yes                                 +---------+---------------+---------+-----------+----------+--------------+ FV DistalFull           Yes      Yes                                 +---------+---------------+---------+-----------+----------+--------------+ PFV      Full                                                        +---------+---------------+---------+-----------+----------+--------------+ POP      Full           Yes      Yes                                 +---------+---------------+---------+-----------+----------+--------------+ PTV       Full                                                        +---------+---------------+---------+-----------+----------+--------------+ PERO     Full                                                        +---------+---------------+---------+-----------+----------+--------------+     Summary: BILATERAL: -No evidence of popliteal cyst, bilaterally. RIGHT: - Findings consistent with acute deep vein thrombosis involving the right peroneal veins.   LEFT: - There is no evidence of deep vein thrombosis in the lower extremity.  *See table(s) above for measurements and observations. Electronically signed by Debby Robertson on 06/02/2024 at 4:10:55 PM.    Final       Blood pressure (!) 116/51, pulse 61, temperature 98.9 F (37.2 C), resp. rate 19, height 5' 2 (1.575 m), weight  85 kg, last menstrual period 10/30/2014, SpO2 99%.  Medical Problem List and Plan: 1. Functional deficits secondary to Bilateral anterior and posterior small infarcts embolic likely secondary to hypercoagulable state from advanced malignancy  -patient may shower  -ELOS/Goals: 7 to 10 days, PT OT SLP supervision to mod a  - Admit to CIR 2.  Antithrombotics: -DVT/anticoagulation:  Mechanical: Sequential compression devices, below knee Bilateral lower extremities Pharmaceutical: Eliquis 5 mg BID   -antiplatelet therapy: N/A 3. Pain Management: History of chronic pain syndrome on scheduled Lyrica  50 mg 3 times daily and oxycodone  10 mg as needed.  Monitor for any adverse effects on mentation. 4. Mood/Behavior/Sleep: LCSW to follow for evaluation and support when available.   -antipsychotic agents: Prozac --- holding Wellbutrin  XL  5. Neuropsych/cognition: This patient may be capable of making decisions on her own behalf. 6. Skin/Wound Care: Routine pressure relief measures.  7. Fluids/Electrolytes/Nutrition: Monitor intake and output. 8.  DVT: LE venous Doppler positive for right peroneal vein acute DVT on Effient  PTA now  on Eliquis 5 mg twice daily.  9. T2DM: A1c 9.2 uncontrolled diabetes.  On home metformin 1000 mg BID -Monitor CBG ACHS with SSI.  Lantus  24 units - Change diet, carb modified.  10. HLD: LDL 40, Lipitor  80 mg daily 11.  CAD: NSTEMI s/p stenting cardiology agreeable with Eliquis alone 12.  Uterine CA: Followed by Dr. Mellie. Received 1 cycle of treatment with chemo-Keytruda  Taxol  carboplatin .  Repeat cycle wad due 06/02/24.  13.  Chronic respiratory failure: Wears 2L at baseline and CPAP at night.  Continue guaifenesin  600 mg twice daily  - Incentive spirometer 14.  HTN: BP stable.  Long-term goal normotensive 15.  Obesity: BMI 34.27, educate on diet and weight loss to promote overall health and mobility.  16. Intertrigo. Nystatin cream 17.  OSA. on CPAP at home   Daphne LITTIE Satterfield, NP 06/03/2024   I have personally performed a face to face diagnostic evaluation of this patient and formulated the key components of the plan.  Additionally, I have personally reviewed laboratory data, imaging studies, as well as relevant notes and concur with the physician assistant's documentation above.  The patient's status has not changed from the original H&P.  Any changes in documentation from the acute care chart have been noted above.  Murray Collier, MD

## 2024-06-03 NOTE — Progress Notes (Signed)
 Babs Arthea DASEN, MD  Physician Physical Medicine and Rehabilitation   Consult Note    Signed   Date of Service: 06/02/2024 10:06 AM  Related encounter: ED to Hosp-Admission (Current) from 05/30/2024 in [facility]   Signed     Expand All Collapse All           Physical Medicine and Rehabilitation Consult Reason for Consult:impaired functional mobility, aphasia Referring Physician: Jerri     HPI: Brenda [name] is a 63 y.o. female with a history of hypertension, stage IV metastatic uterine cancer, diabetes, obstructive sleep apnea on chronic CPAP and oxygen  who was admitted on 05/31/2024 with word finding deficits.  CT of the head was concerning for an acute to subacute infarct in the right temporal and occipital lobe.  CTA of the head and neck previously had been unremarkable.  MRI showed numerous small acute embolic infarcts in the bilateral anterior posterior circulations.  Patient was on aspirin  prior to arrival but was changed to Eliquis for her embolic stroke which was felt to be due to her hyper vagal state related to her metastatic uterine cancer.  Patient was up with therapies yesterday and transferred to sit to stand with contact-guard assist.  She ambulated 60 feet at min assist to contact-guard assist.  For self-care patient required minimal to moderate assistance for basic tasks.  Patient lives at home with her spouse in a 1 level house with ramped entry.  Patient was independent and driving prior to arrival.  She did have some recent falls.       Home: Home Living Family/patient expects to be discharged to:: Private residence Living Arrangements: Children, Spouse/significant other Available Help at Discharge: Family, Available PRN/intermittently Type of Home: House Home Access: Ramped entrance Home Layout: One level Bathroom Shower/Tub: Engineer, manufacturing systems: Standard Bathroom Accessibility: Yes Home Equipment: Rollator (4  wheels), BSC/3in1, Shower seat, Grab bars - toilet, Grab bars - tub/shower (transport chair) Additional Comments: Pt has intermittently used rollator but does not want to use one; family uses transport w/c for community distances  Lives With: Spouse  Functional History: Prior Function Prior Level of Function : History of Falls (last six months), Independent/Modified Independent, Driving Mobility Comments: Recent falls, has used Rollator in past ADLs Comments: Pt has driven short distances a few times since early August. Manages her medications via pillbox. Functional Status:  Mobility: Bed Mobility Overal bed mobility: Needs Assistance Bed Mobility: Supine to Sit Supine to sit: Supervision, HOB elevated, Used rails General bed mobility comments: Exited to the R side Transfers Overall transfer level: Needs assistance Equipment used: None Transfers: Sit to/from Stand, Bed to chair/wheelchair/BSC Sit to Stand: Contact guard assist Bed to/from chair/wheelchair/BSC transfer type:: Step pivot Step pivot transfers: Contact guard assist General transfer comment: Stood from bed height & lower toilet height with cues for hand placement. Cued to use grab bars to assist in bathroom. Ambulation/Gait Ambulation/Gait assistance: Min assist, Contact guard assist Gait Distance (Feet): 60 Feet Assistive device: Rollator (4 wheels) Gait Pattern/deviations: Step-through pattern, Decreased stride length, Drifts right/left General Gait Details: Pt initially minA with lateral LOB with no AD, progressing to CGA with Rollator, requires seated rest break in hall due to feeling like legs were giving out. Gait velocity: reduced Gait velocity interpretation: <1.8 ft/sec, indicate of risk for recurrent falls   ADL: ADL Overall ADL's : Needs assistance/impaired Grooming: Moderate assistance, Cueing for sequencing, Standing, Wash/dry hands, Oral care Grooming Details (indicate cue type  and reason): cues for  identifying grooming supplies & appropriate items on wall (light switch, paper towel & soap dispensers), pt attempting to turn faucet on by moving lightswitch and touching open outlet Lower Body Dressing: Moderate assistance, Cueing for sequencing, Sitting/lateral leans Lower Body Dressing Details (indicate cue type and reason): adjusting B socks Toilet Transfer: Minimal assistance, Ambulation, Regular Toilet Toilet Transfer Details (indicate cue type and reason): cues to safely approach toilet Functional mobility during ADLs: Minimal assistance, Cueing for safety, Cueing for sequencing   Cognition: Cognition Overall Cognitive Status: Impaired/Different from baseline Arousal/Alertness: Awake/alert Orientation Level: Oriented to person, Oriented to place, Oriented to situation, Disoriented to time Year: 2022 Month: May Attention: Sustained Sustained Attention: Appears intact Memory: Impaired Memory Impairment: Retrieval deficit Awareness: Impaired Awareness Impairment: Emergent impairment Problem Solving: Impaired Problem Solving Impairment: Verbal basic, Functional basic Executive Function: Reasoning Reasoning: Appears intact Cognition Arousal: Alert Behavior During Therapy: Flat affect Overall Cognitive Status: Impaired/Different from baseline     Review of Systems  Constitutional:  Positive for malaise/fatigue.  HENT: Negative.    Eyes: Negative.   Respiratory: Negative.    Cardiovascular: Negative.   Gastrointestinal:  Positive for abdominal pain.  Genitourinary: Negative.   Musculoskeletal:  Positive for back pain and joint pain.  Skin: Negative.   Neurological:  Positive for speech change, focal weakness and weakness.  Psychiatric/Behavioral:  Negative for memory loss.        Past Medical History:  Diagnosis Date   Anemia     Arthritis      back, hands, hips (02/22/2015)   CAD (coronary artery disease)      a. complex LAD/diagonal bifurcation PCI in 2010. b.  STEMI 10/2019 s/p PTCA/DES x1 to mLAD overlapping the old stent, residual disease treated medicaly.   Cancer [facility])      uterine   CHF (congestive heart failure) (HCC)     Depression     Diabetes mellitus (HCC)      started when I was pregnant; not sure if it was type 1 or type 2    History of radiation therapy      Endometrium- HDR 01/22/22-03/07/22- Dr. Lynwood Nasuti   Hypercholesterolemia     Hypertension     Morbid obesity (HCC)     Myocardial infarction (HCC)      mild x 3   Neuromuscular disorder (HCC)      neuropathy feet   Sleep apnea      cpap/             Past Surgical History:  Procedure Laterality Date   CARDIAC CATHETERIZATION   12/02/2012   CHOLECYSTECTOMY OPEN   09/04/1987   CORONARY ANGIOPLASTY WITH STENT PLACEMENT   09/03/2009   CORONARY/GRAFT ACUTE MI REVASCULARIZATION N/A 10/26/2019    Procedure: CORONARY/GRAFT ACUTE MI REVASCULARIZATION;  Surgeon: Verlin Lonni BIRCH, MD;  Location: [facility] INVASIVE CV LAB;  Service: Cardiovascular;  Laterality: N/A;   INCISIONAL HERNIA REPAIR N/A 12/09/2021    Procedure: LAPAROSCOPIC REPAIR UMBILICAL HERNIA WITH MESH;  Surgeon: Vernetta Berg, MD;  Location: [facility] ORS;  Service: General;  Laterality: N/A;   IR IMAGING GUIDED PORT INSERTION   04/08/2024   LEFT HEART CATH AND CORONARY ANGIOGRAPHY N/A 10/26/2019    Procedure: LEFT HEART CATH AND CORONARY ANGIOGRAPHY;  Surgeon: Verlin Lonni BIRCH, MD;  Location: [facility] INVASIVE CV LAB;  Service: Cardiovascular;  Laterality: N/A;   LEFT HEART CATHETERIZATION WITH CORONARY ANGIOGRAM N/A 12/25/2012    Procedure: LEFT HEART CATHETERIZATION WITH CORONARY ANGIOGRAM;  Surgeon: Ozell Fell, MD;  Location: [facility] CATH LAB;  Service: Cardiovascular;  Laterality: N/A;   RADIOLOGY WITH ANESTHESIA N/A 04/21/2024    Procedure: MRI WITH ANESTHESIA;  Surgeon: Radiologist, Medication, MD;  Location: [facility] OR;  Service: Radiology;  Laterality: N/A;  BRAIN W/ W/O   ROBOTIC ASSISTED TOTAL HYSTERECTOMY WITH  BILATERAL SALPINGO OOPHERECTOMY N/A 12/06/2021    Procedure: XI ROBOTIC ASSISTED TOTAL HYSTERECTOMY WITH BILATERAL SALPINGO OOPHORECTOMY;  Surgeon: [name] SAUNDERS, MD;  Location: [facility] ORS;  Service: Gynecology;  Laterality: N/A;   UMBILICAL HERNIA REPAIR   05/04/1989    doesn't think they used mesh             Family History  Problem Relation Age of Onset   Coronary artery disease Mother          in her mid 72s   Hypertension Mother     Cancer Mother          skin   Heart attack Father          in his mid 23s   COPD Father     Stroke Sister     Coronary artery disease Sister     Diabetes Sister     Other Half-Sister     Thyroid  cancer Daughter     Prostate cancer Neg Hx     Colon cancer Neg Hx     Breast cancer Neg Hx     Endometrial cancer Neg Hx     Ovarian cancer Neg Hx     Pancreatic cancer Neg Hx          Social History:  reports that she quit smoking about 37 years ago. Her smoking use included cigarettes and cigars. She started smoking about 38 years ago. She has a 0.5 pack-year smoking history. She has never used smokeless tobacco. She reports that she does not drink alcohol and does not use drugs. Allergies:  Allergies       Allergies  Allergen Reactions   Hydrocodone Shortness Of Breath, Dermatitis and Other (See Comments)      I forget to breathe.   Clopidogrel Rash and Dermatitis            Medications Prior to Admission  Medication Sig Dispense Refill   acetaminophen  (TYLENOL ) 500 MG tablet Take 1-2 tablets (500-1,000 mg total) by mouth every 8 (eight) hours as needed for mild pain (pain score 1-3) or moderate pain (pain score 4-6).       atorvastatin  (LIPITOR ) 80 MG tablet Take 1 tablet (80 mg total) by mouth every evening. 30 tablet 6   buPROPion  (WELLBUTRIN  XL) 150 MG 24 hr tablet Take 150 mg by mouth in the morning.       Cholecalciferol  (VITAMIN D3 SUPER STRENGTH) 50 MCG (2000 UT) CAPS Take 2,000 Units by mouth in the morning.        cyproheptadine  (PERIACTIN ) 4 MG tablet Take 4 mg by mouth 3 (three) times daily.       diphenhydrAMINE  (BENADRYL ) 25 mg capsule Take 25 mg by mouth at bedtime as needed for sleep.       FIASP  FLEXTOUCH 100 UNIT/ML FlexTouch Pen Inject 5 Units into the skin with breakfast, with lunch, and with evening meal.       FLUoxetine  (PROZAC ) 40 MG capsule Take 40 mg by mouth daily.       guaiFENesin  (MUCINEX ) 600 MG 12 hr tablet Take 1 tablet (600 mg total) by mouth 2 (two) times daily as needed  for cough or to loosen phlegm. 30 tablet 0   insulin  degludec (TRESIBA) 100 UNIT/ML FlexTouch Pen Inject 24 Units into the skin every evening.       lactulose  (CHRONULAC ) 10 GM/15ML solution Take 45 mLs (30 g total) by mouth 2 (two) times daily as needed for mild constipation or moderate constipation. 236 mL 0   lidocaine  (LIDODERM ) 5 % Place 1 patch onto the skin daily.       lidocaine -prilocaine  (EMLA ) cream Apply to affected area once (Patient taking differently: Apply 1 Application topically as needed [facility]). Apply to affected area once) 30 g 3   losartan  (COZAAR ) 25 MG tablet Take 1 tablet (25 mg total) by mouth daily. 90 tablet 3   metFORMIN (GLUCOPHAGE-XR) 500 MG 24 hr tablet Take 1,000 mg by mouth in the morning and at bedtime.       Misc Natural Products (NEURIVA PO) Take 1 tablet by mouth daily.       Misc. Devices MISC Portable oxygen  concentrator, 2L oxygen .  Diagnoses-chronic respiratory failure with hypoxia 1 each 0   nitroGLYCERIN  (NITROSTAT ) 0.4 MG SL tablet Dissolve 1 tablet under the tongue every 5 minutes as needed for chest pain. Max of 3 doses, then 911. 25 tablet 6   ondansetron  (ZOFRAN ) 8 MG tablet Take 1 tablet (8 mg total) by mouth every 8 (eight) hours as needed for nausea or vomiting. Start on the third day after carboplatin . 30 tablet 1   Oxycodone  HCl 10 MG TABS Take 1 tablet (10 mg total) by mouth every 6 (six) hours as needed. 90 tablet 0   OXYGEN  Inhale 2 L/min into the lungs  continuous.       polyethylene glycol powder (GLYCOLAX /MIRALAX ) 17 GM/SCOOP powder Take 17 g by mouth daily as needed for mild constipation.       prasugrel  (EFFIENT ) 5 MG TABS tablet Take 1 tablet (5 mg total) by mouth in the morning. AFTER TAKING YOUR DOSE ON 04/07/2024, STOP TAKING IT IN PREPARATION FOR THE BIOPSY ON 04/15/2024. AFTER THE BIOPSY, FOLLOW THE RADIOLOGIST RECOMMENDATIONS AS TO TIMING OF RESTARTING THIS MEDICINE.       pregabalin  (LYRICA ) 50 MG capsule Take 1 capsule (50 mg total) by mouth 2 (two) times daily. (Patient taking differently: Take 50 mg by mouth 3 (three) times daily.) 60 capsule 0   prochlorperazine  (COMPAZINE ) 10 MG tablet Take 1 tablet (10 mg total) by mouth every 6 (six) hours as needed for nausea or vomiting. 30 tablet 1   senna-docusate (SENOKOT-S) 8.6-50 MG tablet Take 1-2 tablets by mouth 2 (two) times daily between meals as needed for mild constipation or moderate constipation. (Patient taking differently: Take 1-2 tablets by mouth as needed for mild constipation or moderate constipation.)                Blood pressure 105/74, pulse (!) 59, temperature 97.9 F (36.6 C), temperature source Oral, resp. rate 19, height 5' 2 (1.575 m), weight 85 kg, last menstrual period 10/30/2014, SpO2 98%. Physical Exam Constitutional:      Appearance: She is obese.  HENT:     Head: Normocephalic.     Right Ear: External ear normal.     Left Ear: External ear normal.     Nose: Nose normal.     Mouth/Throat:     Pharynx: Oropharynx is clear.  Eyes:     Conjunctiva/sclera: Conjunctivae normal.  Cardiovascular:     Rate and Rhythm: Bradycardia present.  Pulmonary:  Effort: Pulmonary effort is normal.  Abdominal:     Palpations: Abdomen is soft.  Musculoskeletal:        General: Swelling present.     Cervical back: Normal range of motion.  Skin:    General: Skin is warm.  Neurological:     Mental Status: She is alert.     Comments: Pt is alert, oriented x 3.  Occasional word finding deficits and processing delays. CN exam was non-focal. RUE 4+/5, LUE 4/5. RLE 4 to 4+/5 prox to distal. LLE 3+ to 4/5 prox to distal. I found no consistent sensory deficits. No abnl tone. DTR's 1+  Psychiatric:        Mood and Affect: Mood normal.        Behavior: Behavior normal.       Lab Results Last 24 Hours       Results for orders placed or performed during the hospital encounter of 05/30/24 (from the past 24 hours)  Glucose, capillary     Status: Abnormal    Collection Time: 06/01/24 11:54 AM  Result Value Ref Range    Glucose-Capillary 238 (H) 70 - 99 mg/dL    Comment 1 Notify RN    Glucose, capillary     Status: Abnormal    Collection Time: 06/01/24  3:36 PM  Result Value Ref Range    Glucose-Capillary 239 (H) 70 - 99 mg/dL    Comment 1 Notify RN    Glucose, capillary     Status: Abnormal    Collection Time: 06/01/24  9:18 PM  Result Value Ref Range    Glucose-Capillary 243 (H) 70 - 99 mg/dL    Comment 1 Notify RN    Glucose, capillary     Status: Abnormal    Collection Time: 06/02/24  6:11 AM  Result Value Ref Range    Glucose-Capillary 244 (H) 70 - 99 mg/dL    Comment 1 Notify RN         Imaging Results (Last 48 hours)  ECHOCARDIOGRAM COMPLETE Result Date: 06/01/2024    ECHOCARDIOGRAM REPORT   Patient Name:   YAMARI VENTOLA Sarracino Date of Exam: 06/01/2024 Medical Rec #:  [id number]           Height:       62.0 in Accession #:    [id number]          Weight:       187.4 lb Date of Birth:  [birth year]           BSA:          1.860 m Patient Age:    63 years            BP:           101/63 mmHg Patient Gender: F                   HR:           56 bpm. Exam Location:  Inpatient Procedure: 2D Echo, Cardiac Doppler, Color Doppler and Saline Contrast Bubble            Study (Both Spectral and Color Flow Doppler were utilized during            procedure). Indications:    Stroke  History:        Patient has prior history of Echocardiogram examinations, most                  recent 04/21/2024. CHF, CAD,  Signs/Symptoms:Dyspnea; Risk                 Factors:Hypertension, Dyslipidemia, Sleep Apnea and Diabetes.  Sonographer:    Philomena Daring Referring Phys: ERIC J [place] IMPRESSIONS  1. Left ventricular ejection fraction, by estimation, is 55 to 60%. The left ventricle has normal function. The left ventricle has no regional wall motion abnormalities. Left ventricular diastolic parameters are consistent with Grade II diastolic dysfunction (pseudonormalization).  2. Right ventricular systolic function is low normal. The right ventricular size is normal.  3. The mitral valve is normal in structure. No evidence of mitral valve regurgitation. No evidence of mitral stenosis.  4. The aortic valve is calcified. There is moderate calcification of the aortic valve. There is moderate thickening of the aortic valve. Aortic valve regurgitation is not visualized. Mild aortic valve stenosis. Aortic valve area, by VTI measures 1.47 cm. Aortic valve mean gradient measures 15.0 mmHg. Aortic valve Vmax measures 2.71 m/s.  5. The inferior vena cava is normal in size with greater than 50% respiratory variability, suggesting right atrial pressure of 3 mmHg. FINDINGS  Left Ventricle: Left ventricular ejection fraction, by estimation, is 55 to 60%. The left ventricle has normal function. The left ventricle has no regional wall motion abnormalities. The left ventricular internal cavity size was normal in size. There is  no left ventricular hypertrophy. Left ventricular diastolic parameters are consistent with Grade II diastolic dysfunction (pseudonormalization). Right Ventricle: The right ventricular size is normal. No increase in right ventricular wall thickness. Right ventricular systolic function is low normal. Left Atrium: Left atrial size was normal in size. Right Atrium: Right atrial size was normal in size. Pericardium: There is no evidence of pericardial effusion. Mitral Valve: The mitral valve  is normal in structure. No evidence of mitral valve regurgitation. No evidence of mitral valve stenosis. Tricuspid Valve: The tricuspid valve is normal in structure. Tricuspid valve regurgitation is not demonstrated. No evidence of tricuspid stenosis. Aortic Valve: The aortic valve is calcified. There is moderate calcification of the aortic valve. There is moderate thickening of the aortic valve. Aortic valve regurgitation is not visualized. Mild aortic stenosis is present. Aortic valve mean gradient measures 15.0 mmHg. Aortic valve peak gradient measures 29.4 mmHg. Aortic valve area, by VTI measures 1.47 cm. Pulmonic Valve: The pulmonic valve was normal in structure. Pulmonic valve regurgitation is not visualized. No evidence of pulmonic stenosis. Aorta: The aortic root is normal in size and structure. Venous: The inferior vena cava is normal in size with greater than 50% respiratory variability, suggesting right atrial pressure of 3 mmHg. IAS/Shunts: No atrial level shunt detected by color flow Doppler. Agitated saline contrast was given intravenously to evaluate for intracardiac shunting.  LEFT VENTRICLE PLAX 2D LVIDd:         5.20 cm   Diastology LVIDs:         3.60 cm   LV e' medial:    6.20 cm/s LV PW:         1.20 cm   LV E/e' medial:  19.5 LV IVS:        1.00 cm   LV e' lateral:   6.42 cm/s LVOT diam:     2.00 cm   LV E/e' lateral: 18.8 LV SV:         90 LV SV Index:   48 LVOT Area:     3.14 cm  RIGHT VENTRICLE            IVC RV S  prime:     8.05 cm/s  IVC diam: 1.80 cm TAPSE (M-mode): 1.5 cm LEFT ATRIUM             Index        RIGHT ATRIUM           Index LA diam:        3.80 cm 2.04 cm/m   RA Area:     12.20 cm LA Vol (A2C):   52.0 ml 27.96 ml/m  RA Volume:   23.10 ml  12.42 ml/m LA Vol (A4C):   50.1 ml 26.94 ml/m LA Biplane Vol: 52.2 ml 28.07 ml/m  AORTIC VALVE AV Area (Vmax):    1.36 cm AV Area (Vmean):   1.36 cm AV Area (VTI):     1.47 cm AV Vmax:           271.00 cm/s AV Vmean:           179.250 cm/s AV VTI:            0.612 m AV Peak Grad:      29.4 mmHg AV Mean Grad:      15.0 mmHg LVOT Vmax:         117.00 cm/s LVOT Vmean:        77.600 cm/s LVOT VTI:          0.286 m LVOT/AV VTI ratio: 0.47  AORTA Ao Root diam: 2.80 cm Ao Asc diam:  3.50 cm MITRAL VALVE MV Area (PHT): 2.14 cm     SHUNTS MV Decel Time: 355 msec     Systemic VTI:  0.29 m MV E velocity: 121.00 cm/s  Systemic Diam: 2.00 cm MV A velocity: 123.00 cm/s MV E/A ratio:  0.98 Morene Brownie Electronically signed by Morene Brownie Signature Date/Time: 06/01/2024/9:38:18 PM    Final     EEG adult Result Date: 06/01/2024 Matthews Elida HERO, MD     06/01/2024  7:53 AM Routine EEG Report Denicia Pagliarulo is a 63 y.o. female with a history of altered mental status who is undergoing an EEG to evaluate for seizures. Report: This EEG was acquired with electrodes placed according to the International 10-20 electrode system (including Fp1, Fp2, F3, F4, C3, C4, P3, P4, O1, O2, T3, T4, T5, T6, A1, A2, Fz, Cz, Pz). The following electrodes were missing or displaced: none. The occipital dominant rhythm was 6-7 Hz. This activity is reactive to stimulation. Drowsiness was manifested by background fragmentation; deeper stages of sleep were not identified. There was no focal slowing. There were no interictal epileptiform discharges. There were no electrographic seizures identified. Photic stimulation and hyperventilation were not performed. Impression and clinical correlation: This EEG was obtained while awake and drowsy and is abnormal due to mild diffuse slowing indicative of global cerebral dysfunction. Epileptiform abnormalities were not seen during this recording. Elida Matthews, MD [facility] [phone] If 7pm- 7am, please page neurology on call as listed in AMION.    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 Confusion, weakness, neurologic deficit.  EXAM: MRI HEAD WITHOUT AND WITH CONTRAST TECHNIQUE: Multiplanar, multiecho pulse sequences of the brain and surrounding structures were obtained without and with intravenous contrast. CONTRAST:  8mL GADAVIST  GADOBUTROL  1 MMOL/ML IV SOLN COMPARISON:  Head CT without contrast 0101 hours today. Brain MRI 04/21/2024. FINDINGS: Brain: Confluent restricted diffusion in the right PCA territory from the posterior mesial temporal lobe, patchy right occipital lobe involvement, periatrial white  matter involvement and marginal involvement of the splenium of the corpus callosum. Additional numerous small foci of restricted diffusion scattered in the bilateral cerebellum and cerebral hemispheres. Anterior and posterior circulation affected bilaterally. Deep gray nuclei and brainstem are relatively spared. Cytotoxic edema in the affected areas. No intracranial hemorrhage identified. Luxury perfusion enhancement appearance in the right PCA territory (series 17, image 13). No masslike or suspicious postcontrast enhancement. No dural thickening. No midline shift, ventriculomegaly, evidence of discrete intracranial mass. Cervicomedullary junction and pituitary are within normal limits. No abnormal enhancement identified. Evidence of mass lesion, ventriculomegaly, extra-axial collection or acute intracranial hemorrhage. Cervicomedullary junction and pituitary are within normal limits. Vascular: Major intracranial vascular flow voids are preserved. Skull and upper cervical spine: Visualized bone marrow signal is within normal limits. Negative visible cervical spine and spinal cord. Sinuses/Orbits: Negative. Other: Stable mild mastoid effusion since last month. IMPRESSION: 1. Numerous small acute embolic infarcts in the bilateral anterior and posterior circulation, with superimposed confluent Right PCA territory infarct corresponding to that seen on CT this morning. 2. Cytotoxic edema with no intracranial hemorrhage or mass effect. 3. No  intracranial metastatic disease identified. Electronically Signed   By: VEAR Hurst M.D.   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 Yes Co-Morbidities requiring supervision/potential complications:  - Stage IV uterine cancer -History of CAD and NSTEMI -Uncontrolled diabetes -Hypertension -Morbid obesity with OSA on CPAP and oxygen  Due to bladder management, bowel management, safety, skin/wound care, disease management, medication administration, pain management, and patient education, does the patient require 24 hr/day rehab nursing? Yes Does the patient require coordinated care of a physician, rehab nurse, therapy disciplines of PT, OT, SLP to address physical and functional deficits in the context of the above medical diagnosis(es)? Yes Addressing deficits in the following areas: balance, endurance, locomotion, strength, transferring, bowel/bladder control, bathing, dressing, feeding, grooming, toileting, cognition, language, and psychosocial support Can the patient actively participate in an intensive therapy program of at least 3 hrs of therapy per day at least 5 days per week? Yes The potential for patient to make measurable gains while on inpatient rehab is excellent Anticipated functional outcomes upon discharge from inpatient rehab are modified independent and supervision  with PT, modified independent and supervision with OT, modified independent and supervision with SLP. Estimated rehab length of stay to reach the above functional goals is: 7-10 days Anticipated discharge destination: Home Overall Rehab/Functional Prognosis: excellent   POST ACUTE RECOMMENDATIONS: This patient's condition is appropriate for  continued rehabilitative care in the following setting: CIR Patient has agreed to participate in recommended program. Yes Note that insurance prior authorization may be required for reimbursement for recommended care.   Comment: Rehab Admissions Coordinator to follow up.       I have personally performed a face to face diagnostic evaluation of this patient. Additionally, I have examined the patient's medical record including any pertinent labs and radiographic images.     Thanks,   Arthea ONEIDA Gunther, MD 06/02/2024          Routing History

## 2024-06-04 ENCOUNTER — Other Ambulatory Visit: Payer: Self-pay | Admitting: Hematology and Oncology

## 2024-06-04 ENCOUNTER — Inpatient Hospital Stay: Attending: Hematology and Oncology | Admitting: Licensed Clinical Social Worker

## 2024-06-04 ENCOUNTER — Other Ambulatory Visit (HOSPITAL_COMMUNITY): Payer: Self-pay

## 2024-06-04 DIAGNOSIS — Z79899 Other long term (current) drug therapy: Secondary | ICD-10-CM | POA: Insufficient documentation

## 2024-06-04 DIAGNOSIS — Z5111 Encounter for antineoplastic chemotherapy: Secondary | ICD-10-CM | POA: Insufficient documentation

## 2024-06-04 DIAGNOSIS — Z5112 Encounter for antineoplastic immunotherapy: Secondary | ICD-10-CM | POA: Insufficient documentation

## 2024-06-04 DIAGNOSIS — I6340 Cerebral infarction due to embolism of unspecified cerebral artery: Secondary | ICD-10-CM | POA: Diagnosis not present

## 2024-06-04 DIAGNOSIS — C541 Malignant neoplasm of endometrium: Secondary | ICD-10-CM | POA: Insufficient documentation

## 2024-06-04 LAB — CBC WITH DIFFERENTIAL/PLATELET
Abs Immature Granulocytes: 0.02 K/uL (ref 0.00–0.07)
Basophils Absolute: 0.1 K/uL (ref 0.0–0.1)
Basophils Relative: 1 %
Eosinophils Absolute: 0.1 K/uL (ref 0.0–0.5)
Eosinophils Relative: 2 %
HCT: 26.9 % — ABNORMAL LOW (ref 36.0–46.0)
Hemoglobin: 8.2 g/dL — ABNORMAL LOW (ref 12.0–15.0)
Immature Granulocytes: 0 %
Lymphocytes Relative: 21 %
Lymphs Abs: 1.5 K/uL (ref 0.7–4.0)
MCH: 27.5 pg (ref 26.0–34.0)
MCHC: 30.5 g/dL (ref 30.0–36.0)
MCV: 90.3 fL (ref 80.0–100.0)
Monocytes Absolute: 0.5 K/uL (ref 0.1–1.0)
Monocytes Relative: 8 %
Neutro Abs: 4.9 K/uL (ref 1.7–7.7)
Neutrophils Relative %: 68 %
Platelets: 242 K/uL (ref 150–400)
RBC: 2.98 MIL/uL — ABNORMAL LOW (ref 3.87–5.11)
RDW: 18.1 % — ABNORMAL HIGH (ref 11.5–15.5)
WBC: 7.1 K/uL (ref 4.0–10.5)
nRBC: 0 % (ref 0.0–0.2)

## 2024-06-04 LAB — GLUCOSE, CAPILLARY
Glucose-Capillary: 151 mg/dL — ABNORMAL HIGH (ref 70–99)
Glucose-Capillary: 283 mg/dL — ABNORMAL HIGH (ref 70–99)
Glucose-Capillary: 260 mg/dL — ABNORMAL HIGH (ref 70–99)
Glucose-Capillary: 292 mg/dL — ABNORMAL HIGH (ref 70–99)
Glucose-Capillary: 226 mg/dL — ABNORMAL HIGH (ref 70–99)

## 2024-06-04 MED ORDER — POLYETHYLENE GLYCOL 3350 17 G PO PACK
17.0000 g | PACK | Freq: Every day | ORAL | Status: DC | PRN
  Administered 2024-06-09: 17 g via ORAL
  Filled 2024-06-04: qty 1

## 2024-06-04 MED ORDER — OXYCODONE HCL 5 MG PO TABS
10.0000 mg | ORAL_TABLET | Freq: Two times a day (BID) | ORAL | Status: DC
  Administered 2024-06-04 – 2024-06-12 (×15): 10 mg via ORAL
  Filled 2024-06-04 (×17): qty 2

## 2024-06-04 MED ORDER — OXYCODONE HCL 5 MG PO TABS
10.0000 mg | ORAL_TABLET | Freq: Four times a day (QID) | ORAL | Status: DC | PRN
Start: 2024-06-04 — End: 2024-06-12
  Administered 2024-06-04 – 2024-06-11 (×7): 10 mg via ORAL
  Filled 2024-06-04 (×7): qty 2

## 2024-06-04 NOTE — Progress Notes (Signed)
 Inpatient Rehabilitation Admission Medication Review by a Pharmacist  A complete drug regimen review was completed for this patient to identify any potential clinically significant medication issues.  High Risk Drug Classes Is patient taking? Indication by Medication  Antipsychotic Yes, as an intravenous medication PRN Prochlorperazine  (PO, PR or IV) - nausea  Anticoagulant Yes Apixaban - hypercoagulable state due to cancer, DVT, CVA  Antibiotic Yes Nystatin cream - intertrigo rash under breasts and skin fold  Opioid Yes Oxycodone  scheduled - chronic pain PRN Oxycodone  - moderate or severe pain  Antiplatelet No   Hypoglycemics/insulin  Yes Insulin  aspart and glargine, metformin XR - DM2  Vasoactive Medication No   Chemotherapy No   Other Yes Atorvastatin  - hyperlipidemia Fluoxetine  - depresssion Pregabalin  - neuropathic pain  PRNs: Acetaminophen  - mild pain Maalox - indigestion Calcium  carbonate tabs - indigestion, heartburn Diphenhydramine  - itching Guaifenesin -dextromethorphan - cough Trazodone  - sleep Bisacodyl PR, Miralax , Fleets enema - constipaton     Type of Medication Issue Identified Description of Issue Recommendation(s)  Drug Interaction(s) (clinically significant)     Duplicate Therapy     Allergy     No Medication Administration End Date     Incorrect Dose     Additional Drug Therapy Needed     Significant med changes from prior encounter (inform family/care partners about these prior to discharge). New Apixaban, Nystatin cream, scheduled Oxycodone .  Pregabalin  ordered TID as reported taking, but most recent prescription is for BID.  Bupropion  XL held on admit to inpatient unit due to concern for seizure activity. Communicate changes with patient/family prior to discharge.  Other  Lantus  for [place] while in the hospital. Insulin  Asart for Fiasp .   Off Losartan , Lidocaine  patch, Neuriva, Vitamin D. Resume [place] and Fiasp  at discharge.   Resume  during CIR admit or at discharge if clincally warranted (Except Neuriva, considered herbal and not stocked so only at discharge.)    Clinically significant medication issues were identified that warrant physician communication and completion of prescribed/recommended actions by midnight of the next day:  No  Pharmacist comments:  - First chemotherapy 05/01/24 (Keytruda , Taxol , Carboplatin ); noted plan to resume chemotherapy after discharge.   Time spent performing this drug regimen review (minutes):  [address]   Brenda [name], [place] 06/04/2024 1:26 PM

## 2024-06-04 NOTE — Progress Notes (Signed)
 Inpatient Rehabilitation Center Individual Statement of Services  Patient Name:  Brenda [name]  Date:  06/04/2024  Welcome to the Inpatient Rehabilitation Center.  Our goal is to provide you with an individualized program based on your diagnosis and situation, designed to meet your specific needs.  With this comprehensive rehabilitation program, you will be expected to participate in at least 3 hours of rehabilitation therapies Monday-Friday, with modified therapy programming on the weekends.  Your rehabilitation program will include the following services:  Physical Therapy (PT), Occupational Therapy (OT), Speech Therapy (ST), 24 hour per day rehabilitation nursing, Therapeutic Recreaction (TR), Neuropsychology, Care Coordinator, Rehabilitation Medicine, Nutrition Services, and Pharmacy Services  Weekly team conferences will be held on Wednesday to discuss your progress.  Your Inpatient Rehabilitation Care Coordinator will talk with you frequently to get your input and to update you on team discussions.  Team conferences with you and your family in attendance may also be held.  Expected length of stay: 7-10 days  Overall anticipated outcome: supervision with cues  Depending on your progress and recovery, your program may change. Your Inpatient Rehabilitation Care Coordinator will coordinate services and will keep you informed of any changes. Your Inpatient Rehabilitation Care Coordinator's name and contact numbers are listed  below.  The following services may also be recommended but are not provided by the Inpatient Rehabilitation Center:  Driving Evaluations Home Health Rehabiltiation Services Outpatient Rehabilitation Services  Arrangements will be made to provide these services after discharge if needed.  Arrangements include referral to agencies that provide these services.  Your insurance has been verified to be:  [employer] Your primary doctor is:  [employer]  Pertinent information will be shared with your doctor and your insurance company.  Inpatient Rehabilitation Care Coordinator:  Rhoda Clement, KEN [phone] or ELIGAH BASQUES  Information discussed with and copy given to patient by: Clement Asberry MATSU, 06/04/2024, 8:53 AM

## 2024-06-04 NOTE — Evaluation (Signed)
 Speech Language Pathology Assessment and Plan  Patient Details  Name: Brenda [name] MRN: [id number] Date of Birth: 1960-11-08  SLP Diagnosis: Cognitive Impairments  Rehab Potential: Fair ELOS: 7-10 days    Today's Date: 06/04/2024 SLP Individual Time: 1300-1400 SLP Individual Time Calculation (min): 60 min   Hospital Problem: Principal Problem:   CVA (cerebral vascular accident) [facility])  Past Medical History:  Past Medical History:  Diagnosis Date   Anemia    Arthritis    back, hands, hips (02/22/2015)   CAD (coronary artery disease)    a. complex LAD/diagonal bifurcation PCI in 2010. b. STEMI 10/2019 s/p PTCA/DES x1 to mLAD overlapping the old stent, residual disease treated medicaly.   Cancer [facility])    uterine   CHF (congestive heart failure) (HCC)    Depression    Diabetes mellitus (HCC)    started when I was pregnant; not sure if it was type 1 or type 2    History of radiation therapy    Endometrium- HDR 01/22/22-03/07/22- Dr. Lynwood Nasuti   Hypercholesterolemia    Hypertension    Morbid obesity (HCC)    Myocardial infarction (HCC)    mild x 3   Neuromuscular disorder (HCC)    neuropathy feet   Sleep apnea    cpap/   Past Surgical History:  Past Surgical History:  Procedure Laterality Date   CARDIAC CATHETERIZATION  12/02/2012   CHOLECYSTECTOMY OPEN  09/04/1987   CORONARY ANGIOPLASTY WITH STENT PLACEMENT  09/03/2009   CORONARY/GRAFT ACUTE MI REVASCULARIZATION N/A 10/26/2019   Procedure: CORONARY/GRAFT ACUTE MI REVASCULARIZATION;  Surgeon: Verlin Lonni BIRCH, MD;  Location: [facility] INVASIVE CV LAB;  Service: Cardiovascular;  Laterality: N/A;   INCISIONAL HERNIA REPAIR N/A 12/09/2021   Procedure: LAPAROSCOPIC REPAIR UMBILICAL HERNIA WITH MESH;  Surgeon: Vernetta Berg, MD;  Location: [facility] ORS;  Service: General;  Laterality: N/A;   IR IMAGING GUIDED PORT INSERTION  04/08/2024   LEFT HEART CATH AND CORONARY ANGIOGRAPHY N/A 10/26/2019   Procedure: LEFT HEART  CATH AND CORONARY ANGIOGRAPHY;  Surgeon: Verlin Lonni BIRCH, MD;  Location: [facility] INVASIVE CV LAB;  Service: Cardiovascular;  Laterality: N/A;   LEFT HEART CATHETERIZATION WITH CORONARY ANGIOGRAM N/A 12/25/2012   Procedure: LEFT HEART CATHETERIZATION WITH CORONARY ANGIOGRAM;  Surgeon: Ozell Fell, MD;  Location: [facility] CATH LAB;  Service: Cardiovascular;  Laterality: N/A;   RADIOLOGY WITH ANESTHESIA N/A 04/21/2024   Procedure: MRI WITH ANESTHESIA;  Surgeon: Radiologist, Medication, MD;  Location: [facility] OR;  Service: Radiology;  Laterality: N/A;  BRAIN W/ W/O   ROBOTIC ASSISTED TOTAL HYSTERECTOMY WITH BILATERAL SALPINGO OOPHERECTOMY N/A 12/06/2021   Procedure: XI ROBOTIC ASSISTED TOTAL HYSTERECTOMY WITH BILATERAL SALPINGO OOPHORECTOMY;  Surgeon: [name] SAUNDERS, MD;  Location: [facility] ORS;  Service: Gynecology;  Laterality: N/A;   UMBILICAL HERNIA REPAIR  05/04/1989   doesn't think they used mesh    Assessment / Plan / Recommendation Clinical Impression Pt is a 63 year old female with a complex medical history, including hypertension, stage IV metastatic uterine cancer, type two diabetes, obstructive sleep apnea on chronic CPAP and oxygen , chronic respiratory failure, and CAD/NSTEMI status post stenting (10/2019) on Effient  and Lipitor  80 mg. Allergic to Plavix. Patient presented to [facility] via EMS on 05/31/24 d/t mild confusion and occasional word finding difficulty, noted to be baseline since chemotherapy. A CT of the head was concerning for an acute to subacute infarct in the right temporal and occipital lobe. A CTA of the head and neck was previously unremarkable. MRI showed embolic  infarcts and patient was transferred to [facility] for further workup. Neurology consulted and patient was placed on aspirin , this was later switched to Eliquis as CVA was felt likely due to hypercoagulable state from metastatic cancer. She is followed by Dr. Mellie for metastatic uterine cancer dx in 2023 (s/p  surgery and radiation). Patient with severe back pain 03/2024, CT showed recurrent disease in the retroperitoneal lymph node. CT chest showed lung mass suspicious for metastatic lesion. Pathology confirmed left retroperitoneal mass to be adenocarcinoma. On 9/28 abnormal EEG due to mild diffuse slowing from global cerebral dysfunction. Echo EF 55-60% grade 2 dysfunction with mild aortic valve stenosis. Venous Doppler positive for right peroneal vein acute DVT on 9/30, likely related to hypercoagulable state. Patient reports that as a result of her cancer she has back pain with radiation down her left leg.   Pt presented w/ moderate cognitive deficits in the areas of memory, problem solving, verbal organization, L visual attention and information processing. She completed the [facility] Mental Status (SLUMS) Exam and scored a 17/30. She acknowledged memory deifcits only, demonstrating reduced intellectual awareness. Speech was intelligible throughout and there was no concern for receptive/expressive language deficits. She would benefit from skilled ST services to target cognitive deficits, maximize pt independence, and reduce caregiver burden.  PO trials completed w/ regular textures and thin liquids via straw, as she was completing noon meal upon SLP arrival. No overt s/s of airway invasion noted across trials. Dysphagia intervention not warranted.     Skilled Therapeutic Interventions          SLP facilitated a cognitive-linguistic evaluation and brief bedside swallow evaluation to assess pt's cognitive-communication skills and determine need for additional skilled ST services. See above for more information.    SLP Assessment  Patient will need skilled Speech Lanaguage Pathology Services during CIR admission    Recommendations  SLP Diet Recommendations: Thin;Age appropriate regular solids Liquid Administration via: Straw Medication Administration: Whole meds with liquid Supervision: Patient  able to self feed Compensations: Slow rate;Small sips/bites Postural Changes and/or Swallow Maneuvers: Seated upright 90 degrees;Upright 30-60 min after meal Oral Care Recommendations: Oral care BID Recommendations for Other Services: Therapeutic Recreation consult;Neuropsych consult Therapeutic Recreation Interventions: Pet therapy;Stress management Patient destination: Home Follow up Recommendations: 24 hour supervision/assistance;Outpatient SLP Equipment Recommended: None recommended by SLP    SLP Frequency 3 to 5 out of 7 days   SLP Duration  SLP Intensity  SLP Treatment/Interventions 7-10 days  Minumum of 1-2 x/day, 30 to 90 minutes  Cognitive remediation/compensation;Functional tasks;Cueing hierarchy;Internal/external aids;Patient/family education;Therapeutic Activities    Pain  None endorsed   Prior Functioning  unknown  SLP Evaluation Cognition Overall Cognitive Status: Impaired/Different from baseline Arousal/Alertness: Awake/alert Orientation Level: Oriented X4 Year: 2025 Month: October Day of Week: Correct Attention: Sustained Sustained Attention: Appears intact Sustained Attention Impairment: Verbal complex Memory: Impaired Memory Impairment: Retrieval deficit;Decreased short term memory Decreased Short Term Memory: Verbal complex;Functional basic Awareness: Impaired Awareness Impairment: Intellectual impairment Problem Solving: Impaired Problem Solving Impairment: Verbal basic;Functional basic;Verbal complex Executive Function: Self Monitoring;Self Correcting;Organizing Organizing: Impaired Self Monitoring: Impaired Self Correcting: Impaired Safety/Judgment: Impaired Comments: anticipate reduced judgement given reduced awareness  Comprehension Auditory Comprehension Overall Auditory Comprehension: Appears within functional limits for tasks assessed Expression Expression Primary Mode of Expression: Verbal Verbal Expression Overall Verbal  Expression: Appears within functional limits for tasks assessed Oral Motor Oral Motor/Sensory Function Overall Oral Motor/Sensory Function: Within functional limits Motor Speech Overall Motor Speech: Appears within functional limits for tasks  assessed  Care Tool Care Tool Cognition Ability to hear (with hearing aid or hearing appliances if normally used Ability to hear (with hearing aid or hearing appliances if normally used): 0. Adequate - no difficulty in normal conservation, social interaction, listening to TV   Expression of Ideas and Wants Expression of Ideas and Wants: 4. Without difficulty (complex and basic) - expresses complex messages without difficulty and with speech that is clear and easy to understand   Understanding Verbal and Non-Verbal Content Understanding Verbal and Non-Verbal Content: 4. Understands (complex and basic) - clear comprehension without cues or repetitions  Memory/Recall Ability Memory/Recall Ability : Current season;That he or she is in a hospital/hospital unit   Motor Speech Assessment  WFL  Bedside Swallowing Assessment Oral Care Assessment Level of Consciousness: Alert Thin Liquid Thin Liquid: Within functional limits Presentation: Straw Solid Solid: Within functional limits Presentation: Self Fed   Short Term Goals: Week 1: SLP Short Term Goal 1 (Week 1): STGs = LTGs d/t ELOS  Refer to Care Plan for Long Term Goals  Recommendations for other services: Neuropsych and Therapeutic Recreation  Pet therapy and Stress management  Discharge Criteria: Patient will be discharged from SLP if patient refuses treatment 3 consecutive times without medical reason, if treatment goals not met, if there is a change in medical status, if patient makes no progress towards goals or if patient is discharged from hospital.  The above assessment, treatment plan, treatment alternatives and goals were discussed and mutually agreed upon: by patient and by  family  Recardo DELENA Mole 06/04/2024, 4:31 PM

## 2024-06-04 NOTE — Progress Notes (Signed)
 Inpatient Rehabilitation  Patient information reviewed and entered into eRehab system by Burnard Mealing, OTR/L, Rehab Quality Coordinator.   Information including medical coding, functional ability and quality indicators will be reviewed and updated through discharge.

## 2024-06-04 NOTE — Progress Notes (Signed)
 PROGRESS NOTE   Subjective/Complaints:  Per pt main issue after CVA is word finding, discussed goals of rehab (return to home so she can resume tx for endometrial Ca) Also discussed bowels and DM management   ROS- neg CP, SOB, N/V/D  Objective:   VAS US  LOWER EXTREMITY VENOUS (DVT) Result Date: 06/02/2024  Lower Venous DVT Study Patient Name:  Brenda [name]  Date of Exam:   06/02/2024 Medical Rec #: [id number]            Accession #:    [id number] Date of Birth: Aug 15, 1961            Patient Gender: F Patient Age:   63 years Exam Location:  [facility] Procedure:      VAS US  LOWER EXTREMITY VENOUS (DVT) Referring Phys: ARY XU --------------------------------------------------------------------------------  Indications: Stroke.  Risk Factors: Metastatic cancer, chemotherapy. Comparison Study: Previous exam on 02/09/2021 was negative for DVT Performing Technologist: Ezzie Potters RVT, RDMS  Examination Guidelines: A complete evaluation includes B-mode imaging, spectral Doppler, color Doppler, and power Doppler as needed of all accessible portions of each vessel. Bilateral testing is considered an integral part of a complete examination. Limited examinations for reoccurring indications may be performed as noted. The reflux portion of the exam is performed with the patient in reverse Trendelenburg.  +---------+---------------+---------+-----------+----------+-------------------+ RIGHT    CompressibilityPhasicitySpontaneityPropertiesThrombus Aging      +---------+---------------+---------+-----------+----------+-------------------+ CFV      Full           Yes      Yes                                      +---------+---------------+---------+-----------+----------+-------------------+ SFJ      Full                                                              +---------+---------------+---------+-----------+----------+-------------------+ FV Prox  Full           Yes      Yes                                      +---------+---------------+---------+-----------+----------+-------------------+ FV Mid   Full           Yes      Yes                                      +---------+---------------+---------+-----------+----------+-------------------+ FV DistalFull           Yes      Yes                                      +---------+---------------+---------+-----------+----------+-------------------+  PFV      Full                                                             +---------+---------------+---------+-----------+----------+-------------------+ POP      Full           Yes      Yes                                      +---------+---------------+---------+-----------+----------+-------------------+ PTV      Full                                                             +---------+---------------+---------+-----------+----------+-------------------+ PERO     None           No       No                   Acute one of paired +---------+---------------+---------+-----------+----------+-------------------+   +---------+---------------+---------+-----------+----------+--------------+ LEFT     CompressibilityPhasicitySpontaneityPropertiesThrombus Aging +---------+---------------+---------+-----------+----------+--------------+ CFV      Full           Yes      Yes                                 +---------+---------------+---------+-----------+----------+--------------+ SFJ      Full                                                        +---------+---------------+---------+-----------+----------+--------------+ FV Prox  Full           Yes      Yes                                 +---------+---------------+---------+-----------+----------+--------------+ FV Mid   Full           Yes      Yes                                  +---------+---------------+---------+-----------+----------+--------------+ FV DistalFull           Yes      Yes                                 +---------+---------------+---------+-----------+----------+--------------+ PFV      Full                                                        +---------+---------------+---------+-----------+----------+--------------+  POP      Full           Yes      Yes                                 +---------+---------------+---------+-----------+----------+--------------+ PTV      Full                                                        +---------+---------------+---------+-----------+----------+--------------+ PERO     Full                                                        +---------+---------------+---------+-----------+----------+--------------+     Summary: BILATERAL: -No evidence of popliteal cyst, bilaterally. RIGHT: - Findings consistent with acute deep vein thrombosis involving the right peroneal veins.   LEFT: - There is no evidence of deep vein thrombosis in the lower extremity.  *See table(s) above for measurements and observations. Electronically signed by Debby Robertson on 06/02/2024 at 4:10:55 PM.    Final    Recent Labs    06/03/24 0558 06/04/24 0052  WBC 5.9 7.1  HGB 7.8* 8.2*  HCT 25.2* 26.9*  PLT 228 242   Recent Labs    06/03/24 0558  NA 136  K 4.0  CL 100  CO2 26  GLUCOSE 139*  BUN 8  CREATININE 0.67  CALCIUM  8.6*    Intake/Output Summary (Last 24 hours) at 06/04/2024 0728 Last data filed at 06/03/2024 1955 Gross per 24 hour  Intake 180 ml  Output --  Net 180 ml        Physical Exam: Vital Signs Blood pressure (!) 146/45, pulse 72, temperature 98.5 F (36.9 C), temperature source Axillary, resp. rate 18, height 5' 2 (1.575 m), weight 83 kg, last menstrual period 10/30/2014, SpO2 100%.   General: No acute distress Mood and affect are appropriate Heart:  Regular rate and rhythm no rubs murmurs or extra sounds Lungs: Clear to auscultation, breathing unlabored, no rales or wheezes Abdomen: Positive bowel sounds, soft nontender to palpation, nondistended Extremities: No clubbing, cyanosis, or edema Skin: No evidence of breakdown, no evidence of rash Neurologic:SPeech - neg dysarthria, intact naming  Cranial nerves II through XII intact, motor strength is 5/5 in bilateral deltoid, bicep, tricep, grip, hip flexor, knee extensors, ankle dorsiflexor and plantar flexor Sensory exam normal sensation to light touch in bilateral upper and lower extremities Cerebellar exam normal finger to nose to finger Musculoskeletal: Full range of motion in all 4 extremities. No joint swelling   Assessment/Plan: 1. Functional deficits which require 3+ hours per day of interdisciplinary therapy in a comprehensive inpatient rehab setting. Physiatrist is providing close team supervision and 24 hour management of active medical problems listed below. Physiatrist and rehab team continue to assess barriers to discharge/monitor patient progress toward functional and medical goals  Care Tool:  Bathing              Bathing assist       Upper Body Dressing/Undressing Upper body dressing        Upper body assist  Lower Body Dressing/Undressing Lower body dressing            Lower body assist       Toileting Toileting    Toileting assist       Transfers Chair/bed transfer  Transfers assist           Locomotion Ambulation   Ambulation assist              Walk 10 feet activity   Assist           Walk 50 feet activity   Assist           Walk 150 feet activity   Assist           Walk 10 feet on uneven surface  activity   Assist           Wheelchair     Assist               Wheelchair 50 feet with 2 turns activity    Assist            Wheelchair 150 feet activity      Assist          Blood pressure (!) 146/45, pulse 72, temperature 98.5 F (36.9 C), temperature source Axillary, resp. rate 18, height 5' 2 (1.575 m), weight 83 kg, last menstrual period 10/30/2014, SpO2 100%.  Medical Problem List and Plan: 1. 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  Antithrombotics: -DVT/anticoagulation:  Mechanical: Sequential compression devices, below knee Bilateral lower extremities Pharmaceutical: Eliquis 5 mg BID              -antiplatelet therapy: N/A 3. Pain Management: History of chronic pain syndrome on scheduled Lyrica  50 mg 3 times daily and oxycodone  10 mg as needed.          Monitor for any adverse effects on mentation. 4. Mood/Behavior/Sleep: LCSW to follow for evaluation and support when available.              -antipsychotic agents: Prozac --- holding Wellbutrin  XL  5. Neuropsych/cognition: This patient may be capable of making decisions on her own behalf. 6. Skin/Wound Care: Routine pressure relief measures.  7. Fluids/Electrolytes/Nutrition: Monitor intake and output.    Latest Ref Rng & Units 06/03/2024    5:58 AM 06/01/2024    5:18 AM 05/31/2024   12:11 AM  BMP  Glucose 70 - 99 mg/dL 860  832  712   BUN 8 - 23 mg/dL 8  7  7    Creatinine 0.44 - 1.00 mg/dL 9.32  9.31  9.19   Sodium 135 - 145 mmol/L 136  133  134   Potassium 3.5 - 5.1 mmol/L 4.0  4.3  4.0   Chloride 98 - 111 mmol/L 100  101  97   CO2 22 - 32 mmol/L 26  26    Calcium  8.9 - 10.3 mg/dL 8.6  8.5     Normal 89/8 8.  DVT: LE venous Doppler positive for right peroneal vein acute DVT on Effient  PTA now on Eliquis 5 mg twice daily.  9. T2DM: A1c 9.2 uncontrolled diabetes.  On home metformin 1000 mg BID -Monitor CBG ACHS with SSI.   Lantus  24 units - Change diet, carb modified.  CBG (last 3)  Recent Labs    06/03/24 1555 06/03/24 2026 06/04/24 0532  GLUCAP 231* 301* 151*  Am CBG controlled   10. HLD: LDL 40, Lipitor  80 mg daily 11.  CAD: NSTEMI s/p stenting cardiology agreeable with Eliquis alone 12.  Uterine CA: Followed by Dr. Mellie. Received 1 cycle of treatment with chemo-Keytruda  Taxol  carboplatin .  Repeat cycle wad due 06/02/24.  Chemo interrupted by hospital admissions 04/20/24,05/04/24,05/21/24, as well as present admit 05/30/24 13.  Chronic respiratory failure: Wears 2L at baseline and CPAP at night.  Continue guaifenesin  600 mg twice daily             - Incentive spirometer 14.  HTN: BP stable.  Long-term goal normotensive Vitals:   06/03/24 2032 06/04/24 0311  BP:  (!) 146/45  Pulse: 72 72  Resp: 20 18  Temp:  98.5 F (36.9 C)  SpO2:  100%    15.  Obesity: BMI 34.27, educate on diet and weight loss to promote overall health and mobility.  16. Intertrigo. Nystatin cream 17.  OSA. on CPAP at home      LOS: 1 days A FACE TO FACE EVALUATION WAS PERFORMED  Prentice FORBES Compton 06/04/2024, 7:28 AM

## 2024-06-04 NOTE — Progress Notes (Signed)
 [facility] CSW Counseling Note  Patient was referred by self. Treatment type: Individual *Short visit today due to connectivity issues  Presenting Concerns: Patient and/or family reports the following symptoms/concerns: difficulty adjusting to changes in health status and cancer recurrence Duration of problem: 1 months; Severity of problem: mild   Orientation:oriented to person, place, time/date, and situation.   Affect: Appropriate and Congruent Risk of harm to self or others: No plan to harm self or others  Patient and/or Family's Strengths/Protective Factors: Social connections and Social and Emotional competenceAbility for insight  Religious Affiliation  Supportive family/friends   Patient connected with social worker through [employer] to assist with navigating resources;  Has medication for mood (Prozac  & wellbutrin )   Goals Addressed: Patient will:  Increase knowledge and/or ability of: coping skills related to dealing with major health concerns Increase healthy adjustment to current life circumstances with focus on accepting situations and being able to move forward with day to day living with quality of life Improve ability to grieve over changes and loss and allow others space to grieve   Progress towards Goals: Progressing   Interventions: Interventions utilized:  Mindfulness or Relaxation Training and CBT Cognitive Behavioral Therapy  No updated screeners today        05/07/2024   10:34 AM 05/01/2024    7:59 AM 09/25/2023   10:52 AM  Depression screen PHQ 2/9  Decreased Interest 0 0 0  Down, Depressed, Hopeless 1 0 0  PHQ - 2 Score 1 0 0  Altered sleeping 2    Tired, decreased energy 2    Change in appetite 2    Feeling bad or failure about yourself  1    Trouble concentrating 1    Moving slowly or fidgety/restless 0    Suicidal thoughts 0    PHQ-9 Score 9    Difficult doing work/chores Somewhat difficult         05/07/2024   10:34 AM 12/25/2021    2:49 PM  11/27/2021   11:35 AM 11/01/2021    3:16 PM  GAD 7 : Generalized Anxiety Score  Nervous, Anxious, on Edge 1 1 2 1   Control/stop worrying 1 2 2 1   Worry too much - different things 0 1 2 1   Trouble relaxing 0 1 2 0  Restless 0 1 2 0  Easily annoyed or irritable 1 1 2  0  Afraid - awful might happen 3 2 2 1   Total GAD 7 Score 6 9 14 4   Anxiety Difficulty Somewhat difficult       Assessment: Patient is currently in inpatient rehab after a stroke. Pt reports to be coping well and is using more positive language (ex: opportunity for rehab; enjoying being able to spend time with loved ones). She attributes this to already feeling better and more herself than after 1st hospitalization for altered status with UTI.  CSW continued work with pt on reframing and acceptance in the midst of significant loss of control/autonomy. Pt reports that she almost had one panic attack but was able to use mantras and reminders that she is in a safe place and then distraction to prevent full panic. Reviewed plan for recognizing triggers and then using coping strategies to let the wave pass.     Plan: Follow up with CSW: Pt will call when ready to schedule- currently in rehab Behavioral recommendations:   Notice warning signs of panic attacks (racing worry thoughts, feeling like you are flying apart) Use coping skills/tools: Picture elephants taking  care of you Grounding with senses- notice 5 things you see, 4 things you can feel, 3 that you hear, 2 that you smell, and 1 that you taste Self-hug / rub arms. Remind yourself of where you are and that you are safe. Talk to/call someone you trust  Referral(s): PT referral from MD; info on Aging Gracefully program       Brenda [name] Brenda Mamye Bolds, LCSW   Patient is participating in a Managed Medicaid Plan:  Yes

## 2024-06-04 NOTE — Plan of Care (Signed)
  Problem: RH Balance Goal: LTG Patient will maintain dynamic standing with ADLs (OT) Description: LTG:  Patient will maintain dynamic standing balance with assist during activities of daily living (OT)  Flowsheets (Taken 06/04/2024 1234) LTG: Pt will maintain dynamic standing balance during ADLs with: Supervision/Verbal cueing   Problem: Sit to Stand Goal: LTG:  Patient will perform sit to stand in prep for activites of daily living with assistance level (OT) Description: LTG:  Patient will perform sit to stand in prep for activites of daily living with assistance level (OT) Flowsheets (Taken 06/04/2024 1234) LTG: PT will perform sit to stand in prep for activites of daily living with assistance level: Supervision/Verbal cueing   Problem: RH Grooming Goal: LTG Patient will perform grooming w/assist,cues/equip (OT) Description: LTG: Patient will perform grooming with assist, with/without cues using equipment (OT) Flowsheets (Taken 06/04/2024 1234) LTG: Pt will perform grooming with assistance level of: Supervision/Verbal cueing   Problem: RH Bathing Goal: LTG Patient will bathe all body parts with assist levels (OT) Description: LTG: Patient will bathe all body parts with assist levels (OT) Flowsheets (Taken 06/04/2024 1234) LTG: Pt will perform bathing with assistance level/cueing: Supervision/Verbal cueing   Problem: RH Dressing Goal: LTG Patient will perform upper body dressing (OT) Description: LTG Patient will perform upper body dressing with assist, with/without cues (OT). Flowsheets (Taken 06/04/2024 1234) LTG: Pt will perform upper body dressing with assistance level of: Supervision/Verbal cueing Goal: LTG Patient will perform lower body dressing w/assist (OT) Description: LTG: Patient will perform lower body dressing with assist, with/without cues in positioning using equipment (OT) Flowsheets (Taken 06/04/2024 1234) LTG: Pt will perform lower body dressing with assistance level  of: Supervision/Verbal cueing   Problem: RH Toileting Goal: LTG Patient will perform toileting task (3/3 steps) with assistance level (OT) Description: LTG: Patient will perform toileting task (3/3 steps) with assistance level (OT)  Flowsheets (Taken 06/04/2024 1234) LTG: Pt will perform toileting task (3/3 steps) with assistance level: Supervision/Verbal cueing   Problem: RH Toilet Transfers Goal: LTG Patient will perform toilet transfers w/assist (OT) Description: LTG: Patient will perform toilet transfers with assist, with/without cues using equipment (OT) Flowsheets (Taken 06/04/2024 1234) LTG: Pt will perform toilet transfers with assistance level of: Supervision/Verbal cueing

## 2024-06-04 NOTE — Discharge Instructions (Addendum)
 Inpatient Rehab Discharge Instructions  Brenda [name] Discharge date and time: 06/12/2024  Activities/Precautions/ Functional Status: Activity: no lifting, driving, or strenuous exercise for until cleared by MD Diet: diabetic diet Wound Care: none needed and routine catheter care Functional status:  ___ No restrictions     ___ Walk up steps independently ___ 24/7 supervision/assistance   ___ Walk up steps with assistance _X__ Intermittent supervision/assistance  ___ Bathe/dress independently ___ Walk with walker     ___ Bathe/dress with assistance __X_ Walk Independently    ___ Shower independently ___ Walk with assistance    __X_ Shower with assistance _X__ No alcohol     ___ Return to work/school   Special Instructions:    My questions have been answered and I understand these instructions. I will adhere to these goals and the provided educational materials after my discharge from the hospital.  Patient/Caregiver Signature _______________________________ Date __________  Clinician Signature _______________________________________ Date __________  Please bring this form and your medication list with you to all your follow-up doctor's appointments.     COMMUNITY REFERRALS UPON DISCHARGE:    Home Health:   PT     OT      SP                 Agency:[facility]     [phone]    Medical Equipment/Items Ordered: TUB BENCH                                                 Agency/Supplier:[facility]    [phone]     STROKE/TIA DISCHARGE INSTRUCTIONS SMOKING Cigarette smoking nearly doubles your risk of having a stroke & is the single most alterable risk factor  If you smoke or have smoked in the last 12 months, you are advised to quit smoking for your health. Most of the excess cardiovascular risk related to smoking disappears within a year of stopping. Ask you doctor about anti-smoking medications [place] Quit Line: 1-800-QUIT NOW Free Smoking  Cessation Classes [phone]  CHOLESTEROL Know your levels; limit fat & cholesterol in your diet  Lipid Panel     Component Value Date/Time   CHOL 100 05/30/2024 2350   CHOL 108 01/25/2023 0945   TRIG 170 (H) 05/30/2024 2350   HDL 38 (L) 05/30/2024 2350   HDL 45 01/25/2023 0945   CHOLHDL 2.6 05/30/2024 2350   VLDL 34 05/30/2024 2350   LDLCALC 28 05/30/2024 2350   LDLCALC 40 01/25/2023 0945     Many patients benefit from treatment even if their cholesterol is at goal. Goal: Total Cholesterol (CHOL) less than 160 Goal:  Triglycerides (TRIG) less than 150 Goal:  HDL greater than 40 Goal:  LDL (LDLCALC) less than 100   BLOOD PRESSURE American Stroke Association blood pressure target is less that 120/80 mm/Hg  Your discharge blood pressure is:  BP: (!) 126/46 Monitor your blood pressure Limit your salt and alcohol intake Many individuals will require more than one medication for high blood pressure  DIABETES (A1c is a blood sugar average for last 3 months) Goal HGBA1c is under 7% (HBGA1c is blood sugar average for last 3 months)  Diabetes:    Lab Results  Component Value Date   HGBA1C 7.2 (H) 06/01/2024    Your HGBA1c can be lowered with medications, healthy diet, and exercise. Check your  blood sugar as directed by your physician Call your physician if you experience unexplained or low blood sugars.  PHYSICAL ACTIVITY/REHABILITATION Goal is 30 minutes at least 4 days per week  Activity: Increase activity slowly, and No driving, Therapies: Physical Therapy: Home Health, Occupational Therapy: Home Health, and Speech Therapy: Home Health Return to work: n/a Activity decreases your risk of heart attack and stroke and makes your heart stronger.  It helps control your weight and blood pressure; helps you relax and can improve your mood. Participate in a regular exercise program. Talk with your doctor about the best form of exercise for you (dancing, walking, swimming, cycling).   DIET/WEIGHT Goal is to maintain a healthy weight  Your discharge diet is:  Diet Order             Diet Carb Modified Fluid consistency: Thin; Room service appropriate? Yes  Diet effective now                   Your height is:  Height: 5' 2 (157.5 cm) Your current weight is: Weight: 83.2 kg Your Body Mass Index (BMI) is:  BMI (Calculated): 33.54 Following the type of diet specifically designed for you will help prevent another stroke. Your goal Body Mass Index (BMI) is 19-24. Healthy food habits can help reduce 3 risk factors for stroke:  High cholesterol, hypertension, and excess weight.  RESOURCES Stroke/Support Group:  Call [phone]   STROKE EDUCATION PROVIDED/REVIEWED AND GIVEN TO PATIENT Stroke warning signs and symptoms How to activate emergency medical system (call 911). Medications prescribed at discharge. Need for follow-up after discharge. Personal risk factors for stroke. Pneumonia vaccine given: No Flu vaccine given: No My questions have been answered, the writing is legible, and I understand these instructions.  I will adhere to these goals & educational materials that have been provided to me after my discharge from the hospital.     Information on my medicine - ELIQUIS (apixaban)    This medication education was reviewed with me or my healthcare representative as part of my discharge preparation.    Why was Eliquis prescribed for you? Eliquis was prescribed to treat blood clots that may have been found in the veins of your legs (deep vein thrombosis) and to reduce the risk of them occurring again. And also for recent stroke, to reduce the risk of another stroke. Overall use is for being more susceptible to blood clots forming due to cancer.   What do You need to know about Eliquis ? Your dose is ONE 5 mg tablet taken TWICE daily.  Eliquis may be taken with or without food.   Try to take the dose about the same time in the morning and in the  evening. If you have difficulty swallowing the tablet whole please discuss with your pharmacist how to take the medication safely.  Take Eliquis exactly as prescribed and DO NOT stop taking Eliquis without talking to the doctor who prescribed the medication.  Stopping may increase your risk of developing a new blood clot.  Refill your prescription before you run out.  After discharge, you should have regular check-up appointments with your healthcare provider that is prescribing your Eliquis.    What do you do if you miss a dose? If a dose of ELIQUIS is not taken at the scheduled time, take it as soon as possible on the same day and twice-daily administration should be resumed. The dose should not be doubled to make up for a missed  dose.  Important Safety Information A possible side effect of Eliquis is bleeding. You should call your healthcare provider right away if you experience any of the following: Bleeding from an injury or your nose that does not stop. Unusual colored urine (red or dark brown) or unusual colored stools (red or black). Unusual bruising for unknown reasons. A serious fall or if you hit your head (even if there is no bleeding).  Some medicines may interact with Eliquis and might increase your risk of bleeding or clotting while on Eliquis. To help avoid this, consult your healthcare provider or pharmacist prior to using any new prescription or non-prescription medications, including herbals, vitamins, non-steroidal anti-inflammatory drugs (NSAIDs) and supplements.  This website has more information on Eliquis (apixaban): http://[email or web address].com/eliquis/home

## 2024-06-04 NOTE — Evaluation (Signed)
 Occupational Therapy Assessment and Plan  Patient Details  Name: Brenda [name] MRN: [id number] Date of Birth: Apr 08, 1961  OT Diagnosis: abnormal posture, acute pain, cognitive deficits, muscular wasting and disuse atrophy, and muscle weakness (generalized) Rehab Potential:   ELOS: 7-10   Today's Date: 06/04/2024 OT Individual Time: 1100-1200 OT Individual Time Calculation (min): 60 min     Hospital Problem: Principal Problem:   CVA (cerebral vascular accident) [facility])   Past Medical History:  Past Medical History:  Diagnosis Date   Anemia    Arthritis    back, hands, hips (02/22/2015)   CAD (coronary artery disease)    a. complex LAD/diagonal bifurcation PCI in 2010. b. STEMI 10/2019 s/p PTCA/DES x1 to mLAD overlapping the old stent, residual disease treated medicaly.   Cancer [facility])    uterine   CHF (congestive heart failure) (HCC)    Depression    Diabetes mellitus (HCC)    started when I was pregnant; not sure if it was type 1 or type 2    History of radiation therapy    Endometrium- HDR 01/22/22-03/07/22- Dr. Lynwood Nasuti   Hypercholesterolemia    Hypertension    Morbid obesity (HCC)    Myocardial infarction (HCC)    mild x 3   Neuromuscular disorder (HCC)    neuropathy feet   Sleep apnea    cpap/   Past Surgical History:  Past Surgical History:  Procedure Laterality Date   CARDIAC CATHETERIZATION  12/02/2012   CHOLECYSTECTOMY OPEN  09/04/1987   CORONARY ANGIOPLASTY WITH STENT PLACEMENT  09/03/2009   CORONARY/GRAFT ACUTE MI REVASCULARIZATION N/A 10/26/2019   Procedure: CORONARY/GRAFT ACUTE MI REVASCULARIZATION;  Surgeon: Verlin Lonni BIRCH, MD;  Location: [facility] INVASIVE CV LAB;  Service: Cardiovascular;  Laterality: N/A;   INCISIONAL HERNIA REPAIR N/A 12/09/2021   Procedure: LAPAROSCOPIC REPAIR UMBILICAL HERNIA WITH MESH;  Surgeon: Vernetta Berg, MD;  Location: [facility] ORS;  Service: General;  Laterality: N/A;   IR IMAGING GUIDED PORT INSERTION  04/08/2024    LEFT HEART CATH AND CORONARY ANGIOGRAPHY N/A 10/26/2019   Procedure: LEFT HEART CATH AND CORONARY ANGIOGRAPHY;  Surgeon: Verlin Lonni BIRCH, MD;  Location: [facility] INVASIVE CV LAB;  Service: Cardiovascular;  Laterality: N/A;   LEFT HEART CATHETERIZATION WITH CORONARY ANGIOGRAM N/A 12/25/2012   Procedure: LEFT HEART CATHETERIZATION WITH CORONARY ANGIOGRAM;  Surgeon: Ozell Fell, MD;  Location: [facility] CATH LAB;  Service: Cardiovascular;  Laterality: N/A;   RADIOLOGY WITH ANESTHESIA N/A 04/21/2024   Procedure: MRI WITH ANESTHESIA;  Surgeon: Radiologist, Medication, MD;  Location: [facility] OR;  Service: Radiology;  Laterality: N/A;  BRAIN W/ W/O   ROBOTIC ASSISTED TOTAL HYSTERECTOMY WITH BILATERAL SALPINGO OOPHERECTOMY N/A 12/06/2021   Procedure: XI ROBOTIC ASSISTED TOTAL HYSTERECTOMY WITH BILATERAL SALPINGO OOPHORECTOMY;  Surgeon: [name] SAUNDERS, MD;  Location: [facility] ORS;  Service: Gynecology;  Laterality: N/A;   UMBILICAL HERNIA REPAIR  05/04/1989   doesn't think they used mesh    Assessment & Plan Clinical Impression: [name] is a 63 year old female with a complex medical history, including hypertension, stage IV metastatic uterine cancer, type two diabetes, obstructive sleep apnea on chronic CPAP and oxygen , chronic respiratory failure, and CAD/NSTEMI status post stenting (10/2019) on Effient  and Lipitor  80 mg. Allergic to Plavix. Patient presented to [facility] via EMS on 05/31/24 d/t mild confusion and occasional word finding difficulty, noted to be baseline since chemotherapy.  A CT of the head was concerning for an acute to subacute infarct in the right temporal and occipital  lobe. A CTA of the head and neck was previously unremarkable. MRI showed embolic infarcts and patient was transferred to [facility] for further workup. Neurology consulted and patient was placed on aspirin , this was later switched to Eliquis as CVA was felt likely due to hypercoagulable state from metastatic cancer.   She is followed by Dr. Mellie for metastatic uterine cancer dx in 2023 (s/p surgery and radiation). Patient with severe back pain 03/2024, CT showed recurrent disease in the retroperitoneal lymph node. CT chest showed lung mass suspicious for metastatic lesion. Pathology confirmed left retroperitoneal mass to be adenocarcinoma.  On 9/28 abnormal EEG due to mild diffuse slowing from global cerebral dysfunction.  Echo EF 55-60% grade 2 dysfunction with mild aortic valve stenosis.  Venous Doppler positive for right peroneal vein acute DVT on 9/30, likely related to hypercoagulable state.  Patient reports that as a result of her cancer she has back pain with radiation down her left leg. Per chart review the patient lives at home with her spouse in a 1 level house with ramped entry.  She was independent and driving prior to arrival, there is history of some recent falls.  She currently requires CGA for sit to stand and ambulate 60 feet at min assist to CGA with rollator. Therapy evaluations completed due to patient decreased functional mobility was admitted for a comprehensive rehab program. Patient transferred to CIR on 06/03/2024 .    Patient currently requires min with basic self-care skills secondary to muscle weakness, decreased cardiorespiratoy endurance, impaired timing and sequencing and decreased coordination, decreased motor planning, decreased attention, decreased awareness, decreased problem solving, decreased safety awareness, and delayed processing, and decreased sitting balance, decreased standing balance, decreased postural control, and decreased balance strategies.  Prior to hospitalization, patient could complete BADL with modified independent .  Patient will benefit from skilled intervention to decrease level of assist with basic self-care skills and increase independence with basic self-care skills prior to discharge home with care partner.  Anticipate patient will require 24 hour supervision  and follow up home health.  OT - End of Session Activity Tolerance: Tolerates 30+ min activity with multiple rests Endurance Deficit: Yes OT Assessment OT Patient demonstrates impairments in the following area(s): Balance;Cognition;Endurance;Motor;Pain;Perception;Safety;Sensory;Skin Integrity;Vision OT Basic ADL's Functional Problem(s): Grooming;Bathing;Dressing;Toileting OT Advanced ADL's Functional Problem(s): None OT Transfers Functional Problem(s): Toilet OT Additional Impairment(s): None OT Plan OT Intensity: Minimum of 1-2 x/day, 45 to 90 minutes OT Frequency: 5 out of 7 days OT Duration/Estimated Length of Stay: 7-10 OT Treatment/Interventions: Balance/vestibular training;Discharge planning;Pain management;Self Care/advanced ADL retraining;Therapeutic Activities;UE/LE Coordination activities;Cognitive remediation/compensation;Disease mangement/prevention;Functional mobility training;Patient/family education;Skin care/wound managment;Therapeutic Exercise;Visual/perceptual remediation/compensation;Wheelchair propulsion/positioning;UE/LE Strength taining/ROM;Psychosocial support;Neuromuscular re-education;DME/adaptive equipment instruction;Community reintegration OT Self Feeding Anticipated Outcome(s): no goal OT Basic Self-Care Anticipated Outcome(s): Supervision OT Toileting Anticipated Outcome(s): Supervision OT Bathroom Transfers Anticipated Outcome(s): Supervision OT Recommendation Recommendations for Other Services: None Patient destination: Home Follow Up Recommendations: Home health OT Equipment Recommended: To be determined   OT Evaluation Precautions/Restrictions  Precautions Precautions: Fall Restrictions Weight Bearing Restrictions Per Provider Order: No General   Vital Signs Therapy Vitals Temp: 98 F (36.7 C) Temp Source: Oral Pulse Rate: 75 Resp: 18 BP: (!) 138/48 Patient Position (if appropriate): Sitting Oxygen  Therapy SpO2: 100 % O2 Device: Nasal  Cannula O2 Flow Rate (L/min): 2 L/min Pain Pain Assessment Pain Scale: 0-10 Pain Score: 6  Pain Location: Back Pain Intervention(s): Medication (See eMAR) Home Living/Prior Functioning Home Living Family/patient expects to be discharged to:: Private residence Living Arrangements: Children, Spouse/significant  other Available Help at Discharge: Family Type of Home: House Home Access: Ramped entrance Home Layout: One level Bathroom Shower/Tub: Engineer, manufacturing systems: Standard Bathroom Accessibility: Yes Additional Comments: did not use AD in the home but was using w/c and rollator in the community; on 2L O2  Lives With: Spouse, Family Prior Function Level of Independence: Independent with gait, Independent with transfers, Requires assistive device for independence, Independent with basic ADLs  Able to Take Stairs?:  (avoided) Driving: No Vision Baseline Vision/History: 1 Wears glasses Ability to See in Adequate Light: 1 Impaired (unable to read with glasses on past 1 foot in front of face, baseline) Vision Assessment?: Yes Eye Alignment: Within Functional Limits Ocular Range of Motion: Within Functional Limits Tracking/Visual Pursuits: Able to track stimulus in all quads without difficulty Visual Fields: Left visual field deficit Additional Comments: unclear L side impairment, potential decreased awareness to L Perception  Perception: Impaired Perception-Other Comments: L inattention Praxis   Cognition Cognition Overall Cognitive Status: Impaired/Different from baseline Arousal/Alertness: Awake/alert Orientation Level: Person;Place;Situation Person: Oriented Place: Oriented Situation: Oriented Awareness: Impaired Problem Solving: Impaired Behaviors: Impulsive Safety/Judgment: Impaired Brief Interview for Mental Status (BIMS) Repetition of Three Words (First Attempt): 3 Temporal Orientation: Year: Correct Temporal Orientation: Month: Accurate within 5  days Temporal Orientation: Day: Incorrect Recall: Sock: Yes, no cue required Recall: Blue: Yes, no cue required Recall: Bed: Yes, no cue required BIMS Summary Score: 14 Sensation Sensation Light Touch: Impaired Detail Light Touch Impaired Details: Impaired LLE Proprioception: Impaired Detail Proprioception Impaired Details: Impaired LLE Coordination Gross Motor Movements are Fluid and Coordinated: Yes Fine Motor Movements are Fluid and Coordinated: Yes Motor  Motor Motor: Abnormal postural alignment and control Motor - Skilled Clinical Observations: generalized debility, some L sided deficits in LLE  Trunk/Postural Assessment  Cervical Assessment Cervical Assessment: Exceptions to [facility] Thoracic Assessment Thoracic Assessment: Exceptions to [facility] Lumbar Assessment Lumbar Assessment: Exceptions to [facility] Postural Control Postural Control: Deficits on evaluation  Balance Balance Balance Assessed: Yes Static Sitting Balance Static Sitting - Balance Support: Feet supported Static Sitting - Level of Assistance: 6: Modified independent (Device/Increase time) Dynamic Sitting Balance Dynamic Sitting - Balance Support: During functional activity Dynamic Sitting - Level of Assistance: 5: Stand by assistance Static Standing Balance Static Standing - Level of Assistance: 4: Min assist Dynamic Standing Balance Dynamic Standing - Level of Assistance: 4: Min assist Extremity/Trunk Assessment RUE Assessment RUE Assessment: Within Functional Limits General Strength Comments: grossly 4/5 limited by back pain with some UE ROM testing LUE Assessment LUE Assessment: Within Functional Limits General Strength Comments: grossly 4/5 limited by back pain with some UE ROM testing  Care Tool Care Tool Self Care Eating   Eating Assist Level: Set up assist    Oral Care    Oral Care Assist Level: Supervision/Verbal cueing    Bathing   Body parts bathed by patient: Right arm;Left  arm;Chest;Abdomen;Right upper leg;Left upper leg;Face;Buttocks;Front perineal area Body parts bathed by helper: Right lower leg;Left lower leg   Assist Level: Minimal Assistance - Patient > 75%    Upper Body Dressing(including orthotics)   What is the patient wearing?: Pull over shirt   Assist Level: Supervision/Verbal cueing    Lower Body Dressing (excluding footwear)   What is the patient wearing?: Pants;Underwear/pull up Assist for lower body dressing: Minimal Assistance - Patient > 75%    Putting on/Taking off footwear   What is the patient wearing?: Non-skid slipper socks Assist for footwear: Moderate Assistance - Patient 50 - 74%  Care Tool Toileting Toileting activity   Assist for toileting: Contact Guard/Touching assist     Care Tool Bed Mobility Roll left and right activity   Roll left and right assist level: Supervision/Verbal cueing    Sit to lying activity   Sit to lying assist level: Supervision/Verbal cueing    Lying to sitting on side of bed activity   Lying to sitting on side of bed assist level: the ability to move from lying on the back to sitting on the side of the bed with no back support.: Supervision/Verbal cueing     Care Tool Transfers Sit to stand transfer   Sit to stand assist level: Contact Guard/Touching assist    Chair/bed transfer   Chair/bed transfer assist level: Minimal Assistance - Patient > 75%     Toilet transfer   Assist Level: Minimal Assistance - Patient > 75%     Care Tool Cognition  Expression of Ideas and Wants Expression of Ideas and Wants: 4. Without difficulty (complex and basic) - expresses complex messages without difficulty and with speech that is clear and easy to understand  Understanding Verbal and Non-Verbal Content Understanding Verbal and Non-Verbal Content: 4. Understands (complex and basic) - clear comprehension without cues or repetitions   Memory/Recall Ability Memory/Recall Ability : Current season;That  he or she is in a hospital/hospital unit   Refer to Care Plan for Long Term Goals  SHORT TERM GOAL WEEK 1 OT Short Term Goal 1 (Week 1): STG = LTG at Supervision 2/2 ELOS  Recommendations for other services: None    Skilled Therapeutic Intervention ADL ADL Eating: Set up Grooming: Supervision/safety Upper Body Bathing: Supervision/safety Lower Body Bathing: Minimal assistance Upper Body Dressing: Supervision/safety Lower Body Dressing: Contact guard Toileting: Contact guard Toilet Transfer: Contact guard;Minimal assistance Toilet Transfer Method: Ambulating Mobility  Bed Mobility Bed Mobility: Rolling Right;Supine to Sit;Sit to Supine Rolling Right: Supervision/verbal cueing Supine to Sit: Supervision/Verbal cueing Sit to Supine: Supervision/Verbal cueing Transfers Sit to Stand: Contact Guard/Touching assist Stand to Sit: Contact Guard/Touching assist  Skilled Interventions: Pt seen this date for OT evaluation, see above and below for details. Focus of session on ADL assessment and training when appropriate, pt using bathroom several times with CGA/MIN for all transfers with some assist to manage O2 tubing and to cue for turning method as pt tends to turn fully around instead of shorter way as well as has difficulty finding the bathroom. Sink level bathing 2/2 port with CGA/MIN overall, UB dress Supervision and LB dress with CGA. Pt educated on pacing and energy conservation. Set up in wheelchair alarm on call bell in reach.   Note 6/10 back pain and nursing providing meds at beginning of session.   Discharge Criteria: Patient will be discharged from OT if patient refuses treatment 3 consecutive times without medical reason, if treatment goals not met, if there is a change in medical status, if patient makes no progress towards goals or if patient is discharged from hospital.  The above assessment, treatment plan, treatment alternatives and goals were discussed and mutually  agreed upon: by patient and by family  Chiquita JAYSON Hopping 06/04/2024, 12:30 PM

## 2024-06-04 NOTE — Evaluation (Signed)
 Physical Therapy Assessment and Plan  Patient Details  Name: Brenda [name] MRN: [id number] Date of Birth: [birth year]  PT Diagnosis: Difficulty walking, Impaired cognition, Impaired sensation, Muscle weakness, and Pain in joint Rehab Potential: Good ELOS: 7-10 days   Today's Date: 06/04/2024 PT Individual Time: 9067-8955 PT Individual Time Calculation (min): 72 min    Hospital Problem: Principal Problem:   CVA (cerebral vascular accident) [facility])   Past Medical History:  Past Medical History:  Diagnosis Date   Anemia    Arthritis    back, hands, hips (02/22/2015)   CAD (coronary artery disease)    a. complex LAD/diagonal bifurcation PCI in 2010. b. STEMI 10/2019 s/p PTCA/DES x1 to mLAD overlapping the old stent, residual disease treated medicaly.   Cancer [facility])    uterine   CHF (congestive heart failure) (HCC)    Depression    Diabetes mellitus (HCC)    started when I was pregnant; not sure if it was type 1 or type 2    History of radiation therapy    Endometrium- HDR 01/22/22-03/07/22- Dr. Lynwood Nasuti   Hypercholesterolemia    Hypertension    Morbid obesity (HCC)    Myocardial infarction (HCC)    mild x 3   Neuromuscular disorder (HCC)    neuropathy feet   Sleep apnea    cpap/   Past Surgical History:  Past Surgical History:  Procedure Laterality Date   CARDIAC CATHETERIZATION  12/02/2012   CHOLECYSTECTOMY OPEN  09/04/1987   CORONARY ANGIOPLASTY WITH STENT PLACEMENT  09/03/2009   CORONARY/GRAFT ACUTE MI REVASCULARIZATION N/A 10/26/2019   Procedure: CORONARY/GRAFT ACUTE MI REVASCULARIZATION;  Surgeon: Verlin Lonni BIRCH, MD;  [facility] LAB;  Service: Cardiovascular;  Laterality: N/A;   INCISIONAL HERNIA REPAIR N/A 12/09/2021   Procedure: LAPAROSCOPIC REPAIR UMBILICAL HERNIA WITH MESH;  Surgeon: Vernetta Berg, MD;  Location: [facility] ORS;  Service: General;  Laterality: N/A;   IR IMAGING GUIDED PORT INSERTION  04/08/2024   LEFT HEART CATH AND  CORONARY ANGIOGRAPHY N/A 10/26/2019   Procedure: LEFT HEART CATH AND CORONARY ANGIOGRAPHY;  Surgeon: Verlin Lonni BIRCH, MD;  [facility]: [facility] INVAS[facility] CV LAB;  Service: Cardiovascular;  Laterality: N/A;   LEFT HEART CATHETERIZATION WITH CORONARY ANGIOGRAM N/A 12/25/2012   Procedure: LEFT HEART CATHETERIZATION WITH CORONARY ANGIOGRAM;  Surgeon: Ozell Fell, MD;  Location: [facility] CATH LAB;  Service: Cardiovascular;  Laterality: N/A;   RADIOLOGY WITH ANESTHESIA N/A 04/21/2024   Procedure: MRI WITH ANESTHESIA;  Surgeon: Radiologist, Medication, MD;  [facility]: [facility] OR;  Service: Radiology;  Laterality: N/A;  BRAIN W/ W/O   ROBOTIC ASSISTED TOTAL HYSTERECTOMY WITH BILATERAL SALPINGO OOPHERECTOMY N/A 12/06/2021   Procedure: XI ROBOTIC ASSISTED TOTAL HYSTERECTOMY WITH BILATERAL SALPINGO OOPHORECTOMY;  Surgeon: [name] SAUNDERS, MD;  Location: [facility] ORS;  Service: Gynecology;  Laterality: N/A;   UMBILICAL HERNIA REPAIR  05/04/1989   doesn't think they used mesh    Assessment & Plan Clinical Impression: Patient is a 63 year old female with a complex medical history, including hypertension, stage IV metastatic uterine cancer, type two diabetes, obstructive sleep apnea on chronic CPAP and oxygen , chronic respiratory failure, and CAD/NSTEMI status post stenting (10/2019) on Effient  and Lipitor  80 mg. Allergic to Plavix. Patient presented to [facility] via EMS on 05/31/24 d/t mild confusion and occasional word finding difficulty, noted to be baseline since chemotherapy.  A CT of the head was concerning for an acute to subacute infarct in the right temporal and occipital lobe. A CTA of the head  and neck was previously unremarkable. MRI showed embolic infarcts and patient was transferred to [facility] for further workup. Neurology consulted and patient was placed on aspirin , this was later switched to Eliquis as CVA was felt likely due to hypercoagulable state from metastatic cancer.  She is followed by Dr.  Mellie for metastatic uterine cancer dx in 2023 (s/p surgery and radiation). Patient with severe back pain 03/2024, CT showed recurrent disease in the retroperitoneal lymph node. CT chest showed lung mass suspicious for metastatic lesion. Pathology confirmed left retroperitoneal mass to be adenocarcinoma.  On 9/28 abnormal EEG due to mild diffuse slowing from global cerebral dysfunction.  Echo EF 55-60% grade 2 dysfunction with mild aortic valve stenosis.  Venous Doppler positive for right peroneal vein acute DVT on 9/30, likely related to hypercoagulable state.  Patient reports that as a result of her cancer she has back pain with radiation down her left leg.  Per chart review the patient lives at home with her spouse in a 1 level house with ramped entry.  She was independent and driving prior to arrival, there is history of some recent falls.  She currently requires CGA for sit to stand and ambulate 60 feet at min assist to CGA with rollator. Therapy evaluations completed due to patient decreased functional mobility was admitted for a comprehensive rehab program.   Patient transferred to CIR on 06/03/2024 .   Patient currently requires min with mobility secondary to muscle weakness and muscle joint tightness, decreased cardiorespiratoy endurance and decreased oxygen  support, unbalanced muscle activation, decreased coordination, and decreased motor planning, decreased attention to left, decreased attention, decreased awareness, decreased problem solving, and decreased safety awareness, and decreased sitting balance, decreased standing balance, decreased postural control, and decreased balance strategies.  Prior to hospitalization, patient was modified independent  with mobility and lived with Spouse, Family in a House home.  Home access is  Ramped entrance.  Patient will benefit from skilled PT intervention to maximize safe functional mobility, minimize fall risk, and decrease caregiver burden for planned  discharge home with 24 hour assist.  Anticipate patient will benefit from follow up [facility] at discharge.  PT - End of Session Activity Tolerance: Decreased this session PT Assessment Rehab Potential (ACUTE/IP ONLY): Good PT Patient demonstrates impairments in the following area(s): Balance;Behavior;Endurance;Motor;Pain;Perception;Safety;Sensory;Skin Integrity PT Transfers Functional Problem(s): Bed Mobility;Bed to Chair;Car;Furniture PT Locomotion Functional Problem(s): Ambulation;Stairs;Wheelchair Mobility PT Plan PT Intensity: Minimum of 1-2 x/day ,45 to 90 minutes PT Frequency: 5 out of 7 days PT Duration Estimated Length of Stay: 7-10 days PT Treatment/Interventions: Ambulation/gait training;Balance/vestibular training;Cognitive remediation/compensation;Community reintegration;Discharge planning;Disease management/prevention;DME/adaptive equipment instruction;Functional mobility training;Neuromuscular re-education;Pain management;Patient/family education;Psychosocial support;Skin care/wound management;Splinting/orthotics;Stair training;Therapeutic Activities;Therapeutic Exercise;UE/LE Strength taining/ROM;UE/LE Coordination activities;Visual/perceptual remediation/compensation;Wheelchair propulsion/positioning PT Transfers Anticipated Outcome(s): supervision overall PT Locomotion Anticipated Outcome(s): supervision ambulatory PT Recommendation Recommendations for Other Services: Neuropsych consult;Therapeutic Recreation consult Therapeutic Recreation Interventions: Warehouse manager;Outing/community reintergration Follow Up Recommendations: Home health PT;24 hour supervision/assistance Patient destination: Home Equipment Recommended: To be determined Equipment Details: pt reports having rollator and w/c   PT Evaluation Precautions/Restrictions Precautions Precautions: Fall Restrictions Weight Bearing Restrictions Per Provider Order: No Vitals  Oxygen  Therapy SpO2:  97-100 % O2 Device: Nasal Cannula O2 Flow Rate (L/min): 2 L/min HR = 77-94 bpm with activity Pain Reports pain in hips at back - chronic. Nursing administered medication already. Pain Interference Pain Interference Pain Effect on Sleep: 3. Frequently Pain Interference with Therapy Activities: 1. Rarely or not at all Pain Interference with Day-to-Day Activities: 1. Rarely or  not at all Home Living/Prior Functioning Home Living Living Arrangements: Children;Spouse/significant other Available Help at Discharge: Family Type of Home: House Home Access: Ramped entrance Home Layout: One level Bathroom Shower/Tub: Engineer, manufacturing systems: Standard Bathroom Accessibility: Yes Additional Comments: did not use AD in the home but was using w/c and rollator in the community; on 2L O2  Lives With: Spouse;Family Prior Function Level of Independence: Independent with gait;Independent with transfers;Requires assistive device for independence;Independent with basic ADLs  Able to Take Stairs?:  (avoided) Driving: No Vision/Perception  Vision - History Baseline Vision: Wears glasses all the time Patient Visual Report:  (states her vision is impaired but difficulty determining how) Perception Perception: Impaired Preception Impairment Details: Inattention/Neglect Perception-Other Comments: L inattention  Cognition Overall Cognitive Status: Impaired/Different from baseline Awareness: Impaired Problem Solving: Impaired Behaviors: Impulsive Safety/Judgment: Impaired Sensation Sensation Light Touch: Impaired Detail Light Touch Impaired Details: Impaired LLE (decreased sensation distally) Proprioception: Impaired Detail Proprioception Impaired Details: Impaired LLE Coordination Gross Motor Movements are Fluid and Coordinated: Yes Motor  Motor Motor: Other (comment);Abnormal postural alignment and control Motor - Skilled Clinical Observations: generalized debility, some L sided  deficits in LLE   Trunk/Postural Assessment  Cervical Assessment Cervical Assessment:  (forward head) Thoracic Assessment Thoracic Assessment:  (flexed posture) Lumbar Assessment Lumbar Assessment: Exceptions to [facility] (posterior tilt) Postural Control Postural Control: Deficits on evaluation (decreased awarness to L, impaired balance reactions)  Balance Balance Balance Assessed: Yes Static Sitting Balance Static Sitting - Level of Assistance: 6: Modified independent (Device/Increase time) Dynamic Sitting Balance Dynamic Sitting - Level of Assistance: 5: Stand by assistance Static Standing Balance Static Standing - Level of Assistance: 4: Min assist Dynamic Standing Balance Dynamic Standing - Level of Assistance: 4: Min assist Extremity Assessment      RLE Assessment RLE Assessment: Within Functional Limits General Strength Comments: decreased muscular endurance LLE Assessment LLE Assessment: Exceptions to [facility] General Strength Comments: grossly 4-/5, decreased muscular endurance  Care Tool Care Tool Bed Mobility Roll left and right activity   Roll left and right assist level: Supervision/Verbal cueing    Sit to lying activity   Sit to lying assist level: Supervision/Verbal cueing    Lying to sitting on side of bed activity   Lying to sitting on side of bed assist level: the ability to move from lying on the back to sitting on the side of the bed with no back support.: Supervision/Verbal cueing     Care Tool Transfers Sit to stand transfer   Sit to stand assist level: Contact Guard/Touching assist    Chair/bed transfer   Chair/bed transfer assist level: Minimal Assistance - Patient > 75%    Car transfer   Car transfer assist level: Minimal Assistance - Patient > 75%      Care Tool Locomotion Ambulation   Assist level: Minimal Assistance - Patient > 75% Assistive device: No Device    Walk 10 feet activity   Assist level: Minimal Assistance - Patient >  75% Assistive device: No Device   Walk 50 feet with 2 turns activity   Assist level: Minimal Assistance - Patient > 75% Assistive device: No Device  Walk 150 feet activity Walk 150 feet activity did not occur: Safety/medical concerns (endurance)      Walk 10 feet on uneven surfaces activity   Assist level: Minimal Assistance - Patient > 75%    Stairs   Assist level: Minimal Assistance - Patient > 75% Stairs assistive device: 1 hand rail Max number of stairs:  4  Walk up/down 1 step activity   Walk up/down 1 step (curb) assist level: Minimal Assistance - Patient > 75% Walk up/down 1 step or curb assistive device: 1 hand rail  Walk up/down 4 steps activity   Walk up/down 4 steps assist level: Minimal Assistance - Patient > 75% Walk up/down 4 steps assistive device: 1 hand rail  Walk up/down 12 steps activity Walk up/down 12 steps activity did not occur: Safety/medical concerns (endurance)      Pick up small objects from floor   Pick up small object from the floor assist level: Minimal Assistance - Patient > 75%    Wheelchair Is the patient using a wheelchair?: Yes Type of Wheelchair: Manual   Wheelchair assist level: Dependent - Patient 0%    Wheel 50 feet with 2 turns activity   Assist Level: Dependent - Patient 0%  Wheel 150 feet activity   Assist Level: Dependent - Patient 0%    Refer to Care Plan for Long Term Goals  SHORT TERM GOAL WEEK 1 PT Short Term Goal 1 (Week 1): = LTGs due to ELOS  Recommendations for other services: Neuropsych and Adult nurse group, Stress management, and Outing/community reintegration  Skilled Therapeutic Intervention  Evaluation completed (see details above and below) with education on PT POC and goals and individual treatment initiated with focus on functional mobility in the room for gait and toileting needs, basic transfers, simulated car transfer, gait on uneven surfaces, and stair negotiation. During mobility in  room, pt impulsive at times and appears to demonstrate visual/perceptual deficits and decreased awareness to the L - difficulty x 2 different occasions to locate the bathroom and almost sat on the trashcan in stead of toilet. Pt was wearing her glasses the second time, but still continues with these deficits during mobility (pt difficult to relate visual deficits stating it has been changing). Would benefit from further assessment in sessions. Gait without device with CGA to min A in therapy gym space with increased effort as session continues due to fatigue. Discussed use of rollator as well as a way to conserve energy and increase independence. Required min A for stair negotiation (does not do stairs at home) for functional strengthening. Educated on overall goals as well.   Mobility Bed Mobility Bed Mobility: Rolling Right;Supine to Sit;Sit to Supine Rolling Right: Supervision/verbal cueing Supine to Sit: Supervision/Verbal cueing Sit to Supine: Supervision/Verbal cueing Transfers Transfers: Sit to Stand;Stand to Sit;Stand Pivot Transfers Sit to Stand: Contact Guard/Touching assist Stand to Sit: Contact Guard/Touching assist Stand Pivot Transfers: Contact Guard/Touching assist Locomotion  Gait Gait Distance (Feet): 50 Feet Assistive device: None Gait Gait Pattern: Impaired Stairs / Additional Locomotion Stairs: Yes Stairs Assistance: Minimal Assistance - Patient > 75% Stair Management Technique: One rail Right;Step to pattern Number of Stairs: 4 Height of Stairs: 6 Ramp: Minimal Assistance - Patient >75% Wheelchair Mobility Wheelchair Mobility: No   Discharge Criteria: Patient will be discharged from PT if patient refuses treatment 3 consecutive times without medical reason, if treatment goals not met, if there is a change in medical status, if patient makes no progress towards goals or if patient is discharged from hospital.  The above assessment, treatment plan, treatment  alternatives and goals were discussed and mutually agreed upon: by patient and by family  Elnor Donald Sherrell Donald WENDI Elnor, PT, DPT, CBIS  06/04/2024, 12:23 PM

## 2024-06-04 NOTE — Progress Notes (Signed)
 Inpatient Rehabilitation Care Coordinator Assessment and Plan Patient Details  Name: Brenda [name] MRN: [id number] Date of Birth: Feb 20, 1961  Today's Date: 06/04/2024  Hospital Problems: Principal Problem:   CVA (cerebral vascular accident) [facility])  Past Medical History:  Past Medical History:  Diagnosis Date   Anemia    Arthritis    back, hands, hips (02/22/2015)   CAD (coronary artery disease)    a. complex LAD/diagonal bifurcation PCI in 2010. b. STEMI 10/2019 s/p PTCA/DES x1 to mLAD overlapping the old stent, residual disease treated medicaly.   Cancer [facility])    uterine   CHF (congestive heart failure) (HCC)    Depression    Diabetes mellitus (HCC)    started when I was pregnant; not sure if it was type 1 or type 2    History of radiation therapy    Endometrium- HDR 01/22/22-03/07/22- Dr. Lynwood Nasuti   Hypercholesterolemia    Hypertension    Morbid obesity (HCC)    Myocardial infarction (HCC)    mild x 3   Neuromuscular disorder (HCC)    neuropathy feet   Sleep apnea    cpap/   Past Surgical History:  Past Surgical History:  Procedure Laterality Date   CARDIAC CATHETERIZATION  12/02/2012   CHOLECYSTECTOMY OPEN  09/04/1987   CORONARY ANGIOPLASTY WITH STENT PLACEMENT  09/03/2009   CORONARY/GRAFT ACUTE MI REVASCULARIZATION N/A 10/26/2019   Procedure: CORONARY/GRAFT ACUTE MI REVASCULARIZATION;  Surgeon: Verlin Lonni BIRCH, MD;  [facility];  Service: Cardiovascular;  Laterality: N/A;   INCISIONAL HERNIA REPAIR N/A 12/09/2021   Procedure: LAPAROSCOPIC REPAIR UMBILICAL HERNIA WITH MESH;  Surgeon: Vernetta Berg, MD;  Location: [facility] ORS;  Service: General;  Laterality: N/A;   IR IMAGING GUIDED PORT INSERTION  04/08/2024   LEFT HEART CATH AND CORONARY ANGIOGRAPHY N/A 10/26/2019   Procedure: LEFT HEART CATH AND CORONARY ANGIOGRAPHY;  Surgeon: Verlin Lonni BIRCH, MD;  [facility] LAB;  Service: Cardiovascular;  Laterality: N/A;    LEFT HEART CATHETERIZATION WITH CORONARY ANGIOGRAM N/A 12/25/2012   Procedure: LEFT HEART CATHETERIZATION WITH CORONARY ANGIOGRAM;  Surgeon: Ozell Fell, MD;  Location: [facility] CATH LAB;  Service: Cardiovascular;  Laterality: N/A;   RADIOLOGY WITH ANESTHESIA N/A 04/21/2024   Procedure: MRI WITH ANESTHESIA;  Surgeon: Radiologist, Medication, MD;  Location: [facility] OR;  Service: Radiology;  Laterality: N/A;  BRAIN W/ W/O   ROBOTIC ASSISTED TOTAL HYSTERECTOMY WITH BILATERAL SALPINGO OOPHERECTOMY N/A 12/06/2021   Procedure: XI ROBOTIC ASSISTED TOTAL HYSTERECTOMY WITH BILATERAL SALPINGO OOPHORECTOMY;  Surgeon: [name] SAUNDERS, MD;  Location: [facility] ORS;  Service: Gynecology;  Laterality: N/A;   UMBILICAL HERNIA REPAIR  05/04/1989   doesn't think they used mesh   Social History:  reports that she quit smoking about 37 years ago. Her smoking use included cigarettes and cigars. She started smoking about 38 years ago. She has a 0.5 pack-year smoking history. She has never used smokeless tobacco. She reports that she does not drink alcohol and does not use drugs.  Family / Support Systems Marital Status: Married Patient Roles: Spouse, Parent Spouse/Significant Other: [name] [phone] Children: [name]-daughter [phone]  71 yo son and another child Other Supports: [place] [phone] Anticipated Caregiver: Husband and children Ability/Limitations of Caregiver: Husband works and between he and three children can assist Caregiver Availability: 24/7 Family Dynamics: Close knit with family and extended family. Husband is present today and providing support. Pt reports they have friends and neighbors who will check on them also  Social History Preferred language:  English Religion: Christian Cultural Background: NA Education: HS Health Literacy - How often do you need to have someone help you when you read instructions, pamphlets, or other written material from your doctor or pharmacy?: Never Writes:  Yes Employment Status: Disabled Marine scientist Issues: NA Guardian/Conservator: None-according to MD pt is capable of making her own decisions while here   Abuse/Neglect Abuse/Neglect Assessment Can Be Completed: Yes Physical Abuse: Denies Verbal Abuse: Denies Sexual Abuse: Denies Exploitation of patient/patient's resources: Denies Self-Neglect: Denies  Patient response to: Social Isolation - How often do you feel lonely or isolated from those around you?: Never  Emotional Status [name] affect, behavior and adjustment status: Pt is motivated to do well and recover and get back home so she can do her chemo treatment. She has not be able to do this due to being in the hospital. Recent Psychosocial Issues: other health issues-has cancer Psychiatric History: History of depression takes medications for this and finds them helpful. Will place on neuro-psych to be seen due to much going on with her medical issues Substance Abuse History: Remote smoker-other issues  Patient / Family Perceptions, Expectations & Goals Pt/Family understanding of illness & functional limitations: Pt and husband can explain her stroke and issues and deficits. She and husband have spoken with the MD's involved and know the plan going forward and want to be kept updated with any medical concerns. Premorbid pt/family roles/activities: mom, wife, cancer pt, neighbor, sibling, retiree, friend, etc Anticipated changes in roles/activities/participation: resume Pt/family expectations/goals: Pt states:  I want to be able to do for myself and get home so I can do chemo.  Husband states:  I know she will do her best and we can assist if needed.  Community Resources [employer]: Other (Comment) [facility]) Premorbid Home Care/DME Agencies: Other (Comment) (home O2, rollator, cane transport chair) Transportation available at discharge: husband and family Is the patient able to respond to  transportation needs?: Yes In the past 12 months, has lack of transportation kept you from medical appointments or from getting medications?: No In the past 12 months, has lack of transportation kept you from meetings, work, or from getting things needed for daily living?: No Resource referrals recommended: Neuropsychology  Discharge Planning Living Arrangements: Children, Spouse/significant other Support Systems: Spouse/significant other, Children, Other relatives, Friends/neighbors Type of Residence: Private residence Insurance Resources: Media planner (specify) (Healthy Blue medicaid) Surveyor, quantity Resources: Tree surgeon, Family Support Financial Screen Referred: No Living Expenses: Banker Management: Patient, Spouse Does the patient have any problems obtaining your medications?: No Home Management: self and family Patient/Family Preliminary Plans: Return home with husband who is able to assist but does work but between their three adult children they can assist while he is working. Aware being evaluated today and goals being set for stay here. Care Coordinator Barriers to Discharge: Insurance for SNF coverage, Pending chemo/radiation Care Coordinator Anticipated Follow Up Needs: HH/OP  Clinical Impression Pleasant patient who is motivated to do well and recover from this stroke and get back to her chemo treatments. Her husband and children are supportive and willing to assist. Will await team's evaluations and work on discharge needs. Will place on neuro-psych list to be seen while she is here.  Raymonde Asberry MATSU 06/04/2024, 8:52 AM

## 2024-06-04 NOTE — Plan of Care (Signed)
  Problem: RH Problem Solving Goal: LTG Patient will demonstrate problem solving for (SLP) Description: LTG:  Patient will demonstrate problem solving for basic/complex daily situations with cues  (SLP) Flowsheets (Taken 06/04/2024 1634) LTG: Patient will demonstrate problem solving for (SLP):  Basic daily situations  Complex daily situations LTG Patient will demonstrate problem solving for: Minimal Assistance - Patient > 75% Note: Mildly complex   Problem: RH Memory Goal: LTG Patient will use memory compensatory aids to (SLP) Description: LTG:  Patient will use memory compensatory aids to recall biographical/new, daily complex information with cues (SLP) Flowsheets (Taken 06/04/2024 1634) LTG: Patient will use memory compensatory aids to (SLP): Minimal Assistance - Patient > 75%   Problem: RH Pre-functional/Other (Specify) Goal: RH LTG SLP (Specify) 1 Description: RH LTG SLP (Specify) 1 Flowsheets (Taken 06/04/2024 1634) LTG: Other SLP (Specify) 1: Pt will process mildly complex information w/ minA

## 2024-06-05 ENCOUNTER — Inpatient Hospital Stay: Admitting: Gynecologic Oncology

## 2024-06-05 DIAGNOSIS — I6340 Cerebral infarction due to embolism of unspecified cerebral artery: Secondary | ICD-10-CM | POA: Diagnosis not present

## 2024-06-05 LAB — GLUCOSE, CAPILLARY
Glucose-Capillary: 73 mg/dL (ref 70–99)
Glucose-Capillary: 276 mg/dL — ABNORMAL HIGH (ref 70–99)
Glucose-Capillary: 219 mg/dL — ABNORMAL HIGH (ref 70–99)
Glucose-Capillary: 138 mg/dL — ABNORMAL HIGH (ref 70–99)

## 2024-06-05 MED ORDER — HEPARIN SOD (PORK) LOCK FLUSH 100 UNIT/ML IV SOLN
500.0000 [IU] | INTRAVENOUS | Status: DC
  Administered 2024-06-05: 500 [IU]
  Filled 2024-06-05: qty 5

## 2024-06-05 MED ORDER — INSULIN ASPART 100 UNIT/ML IJ SOLN
4.0000 [IU] | Freq: Three times a day (TID) | INTRAMUSCULAR | Status: DC
Start: 2024-06-05 — End: 2024-06-08
  Administered 2024-06-06 – 2024-06-08 (×5): 4 [IU] via SUBCUTANEOUS

## 2024-06-05 MED ORDER — HEPARIN SOD (PORK) LOCK FLUSH 100 UNIT/ML IV SOLN: 500.0000 [IU] | INTRAVENOUS | Status: DC | PRN

## 2024-06-05 MED ORDER — INSULIN GLARGINE 100 UNIT/ML ~~LOC~~ SOLN
20.0000 [IU] | Freq: Every day | SUBCUTANEOUS | Status: DC
  Administered 2024-06-06: 20 [IU] via SUBCUTANEOUS
  Filled 2024-06-05 (×3): qty 0.2

## 2024-06-05 NOTE — Progress Notes (Signed)
 Physical Therapy Session Note  Patient Details  Name: Brenda [name] MRN: [id number] Date of Birth: Aug 30, 1961  Today's Date: 06/05/2024 PT Individual Time: 1000-1045; 1303 - 1400 PT Individual Time Calculation (min): 45 min; 57 min   Short Term Goals: Week 1:  PT Short Term Goal 1 (Week 1): = LTGs due to ELOS  SESSION 1 Skilled Therapeutic Interventions/Progress Updates: Patient sitting in recliner finishing conversation with nsg coordinator (missed time due to) on entrance to room. Patient alert and agreeable to PT session.   Patient reported 6/10 pain in L LE (upper thigh) and low back (R) chronic. Pt denied wanting medication at this time. Beginning of session focused on building pt rapport and pt's personal goals of working on balance and endurance deficits.   Therapeutic Activity: Transfers: Pt performed sit<>stand transfers throughout session with no AD and with CGA for safety. Pt transported from room<>main gym in [facility] for time management. Patient demonstrates increased fall risk as noted by score of 33/56 on Berg Balance Scale.  (<36= high risk for falls, close to 100%; 37-45 significant >80%; 46-51 moderate >50%; 52-55 lower >25%)  Therapeutic Exercise: Pt performed the following exercises with therapist providing the described cuing and facilitation for improvement. - 3 x 10 sit<>stands with B UE's crossed over arms with rest break required. Pt with VC to control descent and to breathe through nose vs mouth. Pt SpO2 100% following pts report of some SOB and rest break.   Patient sitting in WC at end of session with brakes locked, and all needs within reach.  SESSION 2 Skilled Therapeutic Interventions/Progress Updates: Patient sitting in WC on entrance to room. Patient alert and agreeable to PT session.   Patient reported unrated pain when ambulating in L hip (anterior) that increases when in stance on L and swing on R (therex provided to assist with pt reporting slight  decrease following)  Therapeutic Activity: Bed Mobility: Pt performed sit<supine from EOB with supervision and use of R HOB railing.  Transfers: Pt performed sit<>stand transfers throughout session with no and and with close supervision/light CGA. Pt ambulated short distance in room to toilet with light CGA and portable O2 tank towed by PTA. Pt performed personal care and ambulated to wash hands at sink light CGA. Pt transported outside to [facility] to ambulate on non compliant surfaces and to change setting. Pt ambulated roughly 20 and needed a break due to pain in L anterior hip. PTA provided therex following and transportation back to room in [facility].  Pt R sidelying in bed with PTA passively stretching L hip flexors (contract-relax) with pt reporting increase in L pain after a few reps. Stretching deferred as pt reported having cancer in L LE at thigh (not immediately known in medical chart after reviewing) so PTA reached out to attending PA to further investigate. Pt reported fatigue and requested to rest close to end of session (minutes missed due to)  Therapeutic Exercise: Pt performed the following exercises with therapist providing the described cuing and facilitation for improvement. - Pt cued to actively perform hip flexion L LE seated with reports of pain in same area. PTA passively moved LE into hip flexion with pt reporting no pain.   - PTA provided min resistance against hip flexion at knee with added cuing for pt to control eccentric and to avoid holding breath. Pt performed multiple reps and provided with rest break. PTA then passively moved LE into hip flexion with emphasis on eccentric control.   Patient  supine in bed at end of session with brakes locked, nsg present, bed alarm set, and all needs within reach.      Therapy Documentation Precautions:  Precautions Precautions: Fall Restrictions Weight Bearing Restrictions Per Provider Order: No   Therapy/Group: Individual  Therapy  Natanael Saladin PTA 06/05/2024, 12:41 PM

## 2024-06-05 NOTE — Progress Notes (Signed)
 Occupational Therapy Session Note  Patient Details  Name: Brenda [name] MRN: [id number] Date of Birth: Aug 07, 1961  Today's Date: 06/05/2024 OT Individual Time: 8944-8794 OT Individual Time Calculation (min): 70 min    Short Term Goals: Week 1:  OT Short Term Goal 1 (Week 1): STG = LTG at Supervision 2/2 ELOS  Skilled Therapeutic Interventions/Progress Updates:    Pt received in w/c and as session started, IV team arrived to remove needle from her port which now allows her to shower.   Set up bathroom to prepare for the shower.   Focus of session on activity tolerance and managing her O2 tubing with ambulation. Pt cued to pull tubing up and carry it folding in her hands to avoid tripping on it.  Needed a few cues with bathing and dressing to fully attend to L side.  Overall she participated well and she wants to improve her overall strength to return home.   Discussed DME needs of a tub bench. See ADL documentation below.    Pt returned to w/c to rest. All needs met.  Call light in reach and alarm set   Therapy Documentation Precautions:  Precautions Precautions: Fall Restrictions Weight Bearing Restrictions Per Provider Order: No    Pain: Pain Assessment Pain Score: 0-No pain ADL: ADL Eating: Set up Grooming: Supervision/safety Upper Body Bathing: Supervision/safety Where Assessed-Upper Body Bathing: Shower Lower Body Bathing: Supervision/safety Where Assessed-Lower Body Bathing: Shower (seated on Tub bench) Upper Body Dressing: Supervision/safety Lower Body Dressing: Contact guard Toileting: Supervision/safety Where Assessed-Toileting: Advice worker, Minimal assistance Toilet Transfer Method: Event organiser: Contact guard, Minimal assistance Film/video editor Method: Designer, industrial/product: Emergency planning/management officer, Grab bars   Therapy/Group: Individual Therapy  Tavarion Babington 06/05/2024, 1:31 PM

## 2024-06-05 NOTE — Progress Notes (Signed)
   06/05/24 0014  BiPAP/CPAP/SIPAP  $ Non-Invasive Home Ventilator  Subsequent  BiPAP/CPAP/SIPAP Pt Type Adult  BiPAP/CPAP/SIPAP Resmed  Mask Type Full face mask  Mask Size Medium  Respiratory Rate 18 breaths/min  FiO2 (%) 28 %  Flow Rate 2 lpm  Patient Home Machine No  Patient Home Mask No  Patient Home Tubing No  Auto Titrate Yes  Minimum cmH2O 5 cmH2O  Maximum cmH2O 14 cmH2O  Device Plugged into RED Power Outlet Yes

## 2024-06-05 NOTE — Progress Notes (Signed)
 PROGRESS NOTE   Subjective/Complaints:  Reviewed labs   ROS- neg CP, SOB, N/V/D  Objective:   No results found.  Recent Labs    06/03/24 0558 06/04/24 0052  WBC 5.9 7.1  HGB 7.8* 8.2*  HCT 25.2* 26.9*  PLT 228 242   Recent Labs    06/03/24 0558  NA 136  K 4.0  CL 100  CO2 26  GLUCOSE 139*  BUN 8  CREATININE 0.67  CALCIUM  8.6*    Intake/Output Summary (Last 24 hours) at 06/05/2024 9287 Last data filed at 06/04/2024 2016 Gross per 24 hour  Intake 600 ml  Output --  Net 600 ml        Physical Exam: Vital Signs Blood pressure (!) 140/61, pulse 71, temperature 97.8 F (36.6 C), temperature source Oral, resp. rate 18, height 5' 2 (1.575 m), weight 83 kg, last menstrual period 10/30/2014, SpO2 100%.   General: No acute distress Mood and affect are appropriate Heart: Regular rate and rhythm no rubs murmurs or extra sounds Lungs: Clear to auscultation, breathing unlabored, no rales or wheezes Abdomen: Positive bowel sounds, soft nontender to palpation, nondistended Extremities: No clubbing, cyanosis, or edema Skin: No evidence of breakdown, no evidence of rash Neurologic:SPeech - neg dysarthria, intact naming  Cranial nerves II through XII intact, motor strength is 5/5 in bilateral deltoid, bicep, tricep, grip, hip flexor, knee extensors, ankle dorsiflexor and plantar flexor Sensory exam normal sensation to light touch in bilateral upper and lower extremities Cerebellar exam normal finger to nose to finger Musculoskeletal: Full range of motion in all 4 extremities. No joint swelling   Assessment/Plan: 1. Functional deficits which require 3+ hours per day of interdisciplinary therapy in a comprehensive inpatient rehab setting. Physiatrist is providing close team supervision and 24 hour management of active medical problems listed below. Physiatrist and rehab team continue to assess barriers to  discharge/monitor patient progress toward functional and medical goals  Care Tool:  Bathing    Body parts bathed by patient: Right arm, Left arm, Chest, Abdomen, Right upper leg, Left upper leg, Face, Buttocks, Front perineal area   Body parts bathed by helper: Right lower leg, Left lower leg     Bathing assist Assist Level: Minimal Assistance - Patient > 75%     Upper Body Dressing/Undressing Upper body dressing   What is the patient wearing?: Pull over shirt    Upper body assist Assist Level: Supervision/Verbal cueing    Lower Body Dressing/Undressing Lower body dressing      What is the patient wearing?: Pants, Underwear/pull up     Lower body assist Assist for lower body dressing: Minimal Assistance - Patient > 75%     Toileting Toileting    Toileting assist Assist for toileting: Contact Guard/Touching assist     Transfers Chair/bed transfer  Transfers assist     Chair/bed transfer assist level: Minimal Assistance - Patient > 75%     Locomotion Ambulation   Ambulation assist      Assist level: Minimal Assistance - Patient > 75% Assistive device: No Device     Walk 10 feet activity   Assist     Assist level: Minimal Assistance -  Patient > 75% Assistive device: No Device   Walk 50 feet activity   Assist    Assist level: Minimal Assistance - Patient > 75% Assistive device: No Device    Walk 150 feet activity   Assist Walk 150 feet activity did not occur: Safety/medical concerns (endurance)         Walk 10 feet on uneven surface  activity   Assist     Assist level: Minimal Assistance - Patient > 75%     Wheelchair     Assist Is the patient using a wheelchair?: Yes Type of Wheelchair: Manual    Wheelchair assist level: Dependent - Patient 0%      Wheelchair 50 feet with 2 turns activity    Assist        Assist Level: Dependent - Patient 0%   Wheelchair 150 feet activity     Assist      Assist  Level: Dependent - Patient 0%   Blood pressure (!) 140/61, pulse 71, temperature 97.8 F (36.6 C), temperature source Oral, resp. rate 18, height 5' 2 (1.575 m), weight 83 kg, last menstrual period 10/30/2014, SpO2 100%.  Medical Problem List and Plan: 1. 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  Antithrombotics: -DVT/anticoagulation:  Mechanical: Sequential compression devices, below knee Bilateral lower extremities Pharmaceutical: Eliquis 5 mg BID              -antiplatelet therapy: N/A 3. Pain Management: History of chronic pain syndrome on scheduled Lyrica  50 mg 3 times daily and oxycodone  10 mg as needed.          Monitor for any adverse effects on mentation. 4. Mood/Behavior/Sleep: LCSW to follow for evaluation and support when available.              -antipsychotic agents: Prozac --- holding Wellbutrin  XL  5. Neuropsych/cognition: This patient may be capable of making decisions on her own behalf. 6. Skin/Wound Care: Routine pressure relief measures.  7. Fluids/Electrolytes/Nutrition: Monitor intake and output.    Latest Ref Rng & Units 06/03/2024    5:58 AM 06/01/2024    5:18 AM 05/31/2024   12:11 AM  BMP  Glucose 70 - 99 mg/dL 860  832  712   BUN 8 - 23 mg/dL 8  7  7    Creatinine 0.44 - 1.00 mg/dL 9.32  9.31  9.19   Sodium 135 - 145 mmol/L 136  133  134   Potassium 3.5 - 5.1 mmol/L 4.0  4.3  4.0   Chloride 98 - 111 mmol/L 100  101  97   CO2 22 - 32 mmol/L 26  26    Calcium  8.9 - 10.3 mg/dL 8.6  8.5     Normal 89/8 8.  DVT: LE venous Doppler positive for right peroneal vein acute DVT on Effient  PTA now on Eliquis 5 mg twice daily.  9. T2DM: A1c 9.2 uncontrolled diabetes.  On home metformin 1000 mg BID -Monitor CBG ACHS with SSI.  Lantus  24  units - Change diet, carb modified.  CBG (last 3)  Recent Labs    06/04/24 1647 06/04/24 2041 06/05/24 0549  GLUCAP 292* 226* 73  Am  CBG controlled in fact on low side , reduce lantus  , increase mealtime novalog 10/3, at home was using Levimir 12U , with SSI and Metformin  10. HLD: LDL 40, Lipitor  80 mg daily 11.  CAD: NSTEMI s/p stenting cardiology agreeable with Eliquis alone 12.  Uterine CA: Followed by Dr. Mellie. Received 1 cycle of treatment with chemo-Keytruda  Taxol  carboplatin .  Repeat cycle wad due 06/02/24. F/u as OP  Chemo interrupted by hospital admissions 04/20/24,05/04/24,05/21/24, as well as present admit 05/30/24 13.  Chronic respiratory failure: Wears 2L at baseline and CPAP at night.  Continue guaifenesin  600 mg twice daily             - Incentive spirometer 14.  HTN: BP stable.  Long-term goal normotensive Vitals:   06/05/24 0433 06/05/24 0517  BP: (!) 140/61   Pulse: 71 71  Resp: 18   Temp: 97.8 F (36.6 C)   SpO2: 99% 100%    15.  Obesity: BMI 34.27, educate on diet and weight loss to promote overall health and mobility.  16. Intertrigo. Nystatin cream 17.  OSA. on CPAP at home  18.  Normochromic , normocytic anemia , abnormal x ~1 yr , ? CA related , no recent hemocults, will order since pt is now on Eliquis     LOS: 2 days A FACE TO FACE EVALUATION WAS PERFORMED  Prentice FORBES Compton 06/05/2024, 7:12 AM

## 2024-06-05 NOTE — Progress Notes (Signed)
 Patient ID: Alane Hanssen, female   DOB: 1961/02/25, 63 y.o.   MRN: [id number] Met with the patient to review current medical situation, rehab process, team conference and plan of care. Reviewed secondary risk management including Right Peroneal DVT (Eliquis), DM (A1C 7.2); taking Lantus  PTA, HLD, HF, OSA and Lung mets/leg tumors (Chemo/Rad tx. On hold). Reviewed medications and HH diet recommendations. Continue to follow along to address educational needs to facilitate preparation for discharge.Fredericka Barnie NOVAK

## 2024-06-06 DIAGNOSIS — I6389 Other cerebral infarction: Secondary | ICD-10-CM | POA: Diagnosis not present

## 2024-06-06 LAB — GLUCOSE, CAPILLARY
Glucose-Capillary: 148 mg/dL — ABNORMAL HIGH (ref 70–99)
Glucose-Capillary: 256 mg/dL — ABNORMAL HIGH (ref 70–99)
Glucose-Capillary: 101 mg/dL — ABNORMAL HIGH (ref 70–99)
Glucose-Capillary: 120 mg/dL — ABNORMAL HIGH (ref 70–99)

## 2024-06-06 NOTE — IPOC Note (Signed)
 Overall Plan of Care [facility]) Patient Details Name: Brenda [name] MRN: [id number] DOB: 12/12/1960  Admitting Diagnosis: CVA (cerebral vascular accident) [facility])  Hospital Problems: Principal Problem:   CVA (cerebral vascular accident) [facility])     Functional Problem List: Nursing Bladder, Bowel, Edema, Endurance, Medication Management, Pain  PT Balance, Behavior, Endurance, Motor, Pain, Perception, Safety, Sensory, Skin Integrity  OT Balance, Cognition, Endurance, Motor, Pain, Perception, Safety, Sensory, Skin Integrity, Vision  SLP Cognition  TR         Basic ADL's: OT Grooming, Bathing, Dressing, Toileting     Advanced  ADL's: OT None     Transfers: PT Bed Mobility, Bed to Chair, Set designer, Oncologist: PT Ambulation, Stairs, Psychologist, prison and probation services     Additional Impairments: OT None  SLP Social Cognition   Problem Solving, Memory, Attention, Awareness  TR      Anticipated Outcomes Item Anticipated Outcome  Self Feeding no goal  Swallowing      Basic self-care  Supervision  Toileting  Supervision   Bathroom Transfers Supervision  Bowel/Bladder  maange bladder with time toileting/ manage bowels with prn medications  Transfers  supervision overall  Locomotion  supervision ambulatory  Communication     Cognition  minA  Pain  <4 w/ medications  Safety/Judgment  manage safety with supervision asistance   Therapy Plan: PT Intensity: Minimum of 1-2 x/day ,45 to 90 minutes PT Frequency: 5 out of 7 days PT Duration Estimated Length of Stay: 7-10 days OT Intensity: Minimum of 1-2 x/day, 45 to 90 minutes OT Frequency: 5 out of 7 days OT Duration/Estimated Length of Stay: 7-10 SLP Intensity: Minumum of 1-2 x/day, 30 to 90 minutes SLP Frequency: 3 to 5 out of 7 days SLP Duration/Estimated Length of Stay: 7-10 days   Team Interventions: Nursing Interventions Patient/Family Education, Bladder Management, Bowel Management, Disease  Management/Prevention, Pain Management, Medication Management, Discharge Planning  PT interventions Ambulation/gait training, Balance/vestibular training, Cognitive remediation/compensation, Community reintegration, Discharge planning, Disease management/prevention, DME/adaptive equipment instruction, Functional mobility training, Neuromuscular re-education, Pain management, Patient/family education, Psychosocial support, Skin care/wound management, Splinting/orthotics, Stair training, Therapeutic Activities, Therapeutic Exercise, UE/LE Strength taining/ROM, UE/LE Coordination activities, Visual/perceptual remediation/compensation, Wheelchair propulsion/positioning  OT Interventions Balance/vestibular training, Discharge planning, Pain management, Self Care/advanced ADL retraining, Therapeutic Activities, UE/LE Coordination activities, Cognitive remediation/compensation, Disease mangement/prevention, Functional mobility training, Patient/family education, Skin care/wound managment, Therapeutic Exercise, Visual/perceptual remediation/compensation, Wheelchair propulsion/positioning, UE/LE Strength taining/ROM, Psychosocial support, Neuromuscular re-education, DME/adaptive equipment instruction, Community reintegration  SLP Interventions Cognitive remediation/compensation, Functional tasks, Financial trader, Internal/external aids, Patient/family education, Therapeutic Activities  TR Interventions    SW/CM Interventions Discharge Planning, Psychosocial Support, Patient/Family Education   Barriers to Discharge MD  Medical stability, Home enviroment access/loayout, Lack of/limited family support, Weight, Pending chemo/radiation, and Behavior  Nursing Decreased caregiver support, Home environment access/layout, Pending chemo/radiation Discharge Living Setting: Patient's home, Lives with (comment) (spouse, 3 adult children PRN)  Type of Home at Discharge: House  Discharge Home Layout: One level  Discharge Home  Access: Ramped entrance  PT      OT      SLP Other (comments) PLOF  SW Insurance for SNF coverage, Pending chemo/radiation     Team Discharge Planning: Destination: PT-Home ,OT- Home , SLP-Home Projected Follow-up: PT-Home health PT, 24 hour supervision/assistance, OT-  Home health OT, SLP-24 hour supervision/assistance, Outpatient SLP Projected Equipment Needs: PT-To be determined, OT- To be determined, SLP-None recommended by SLP Equipment Details: PT-pt reports having rollator and w/c, OT-  Patient/family involved in discharge planning: PT- Patient, Family member/caregiver,  OT-Patient, Family member/caregiver, SLP-Patient, Family member/caregiver  MD ELOS: 7-10 days Medical Rehab Prognosis:  Fair Assessment: The patient has been admitted for CIR therapies with the diagnosis of multiple stroke sin setting of advanced cancer. The team will be addressing functional mobility, strength, stamina, balance, safety, adaptive techniques and equipment, self-care, bowel and bladder mgt, patient and caregiver education, . Goals have been set at Min A to supervision. Anticipated discharge destination is home.        See Team Conference Notes for weekly updates to the plan of care

## 2024-06-06 NOTE — Progress Notes (Signed)
 PROGRESS NOTE   Subjective/Complaints:  Pt reports back sore form PT yesterday. LBM yesterday  Per d/w PT, pt seeing double- a mirror image, based on her drawing it.   Went back and spoke with pt after AM rounds.   ROS-   Pt denies SOB, abd pain, CP, N/V/C/D, and  (+) vision changes   Objective:   No results found.  Recent Labs    06/04/24 0052  WBC 7.1  HGB 8.2*  HCT 26.9*  PLT 242   No results for input(s): NA, K, CL, CO2, GLUCOSE, BUN, CREATININE, CALCIUM  in the last 72 hours.   Intake/Output Summary (Last 24 hours) at 06/06/2024 1113 Last data filed at 06/06/2024 0816 Gross per 24 hour  Intake 890 ml  Output --  Net 890 ml        Physical Exam: Vital Signs Blood pressure (!) 137/58, pulse 69, temperature 98 F (36.7 C), temperature source Oral, resp. rate 18, height 5' 2 (1.575 m), weight 83 kg, last menstrual period 10/30/2014, SpO2 100%.    General: awake, alert, appropriate,  woke form sleep first time and in OT 2nd time; NAD HENT: conjugate gaze; oropharynx dry- wearing CPAP first time and 2nd time on O2 by [place] CV: regular rate and rhythm; no JVD Pulmonary: coarse breath sounds; but otherwise clear GI: soft, NT, ND, (+)BS- protuberant Psychiatric: appropriate- interactive Neurological: alert- delayed responses, but decent historian  Skin: No evidence of breakdown, no evidence of rash Neurologic:SPeech - neg dysarthria, intact naming  Cranial nerves II through XII intact, motor strength is 5/5 in bilateral deltoid, bicep, tricep, grip, hip flexor, knee extensors, ankle dorsiflexor and plantar flexor Sensory exam normal sensation to light touch in bilateral upper and lower extremities Cerebellar exam normal finger to nose to finger Musculoskeletal: Full range of motion in all 4 extremities. No joint swelling   Assessment/Plan: 1. Functional deficits which require 3+ hours per day  of interdisciplinary therapy in a comprehensive inpatient rehab setting. Physiatrist is providing close team supervision and 24 hour management of active medical problems listed below. Physiatrist and rehab team continue to assess barriers to discharge/monitor patient progress toward functional and medical goals  Care Tool:  Bathing    Body parts bathed by patient: Right arm, Left arm, Chest, Abdomen, Right upper leg, Left upper leg, Face, Buttocks, Front perineal area, Right lower leg, Left lower leg   Body parts bathed by helper: Right lower leg, Left lower leg     Bathing assist Assist Level: Supervision/Verbal cueing     Upper Body Dressing/Undressing Upper body dressing   What is the patient wearing?: Dress    Upper body assist Assist Level: Supervision/Verbal cueing    Lower Body Dressing/Undressing Lower body dressing      What is the patient wearing?: Pants, Underwear/pull up     Lower body assist Assist for lower body dressing: Minimal Assistance - Patient > 75%     Toileting Toileting    Toileting assist Assist for toileting: Supervision/Verbal cueing     Transfers Chair/bed transfer  Transfers assist     Chair/bed transfer assist level: Minimal Assistance - Patient > 75%     Locomotion  Ambulation   Ambulation assist      Assist level: Minimal Assistance - Patient > 75% Assistive device: No Device     Walk 10 feet activity   Assist     Assist level: Minimal Assistance - Patient > 75% Assistive device: No Device   Walk 50 feet activity   Assist    Assist level: Minimal Assistance - Patient > 75% Assistive device: No Device    Walk 150 feet activity   Assist Walk 150 feet activity did not occur: Safety/medical concerns (endurance)         Walk 10 feet on uneven surface  activity   Assist     Assist level: Minimal Assistance - Patient > 75%     Wheelchair     Assist Is the patient using a wheelchair?:  Yes Type of Wheelchair: Manual    Wheelchair assist level: Dependent - Patient 0%      Wheelchair 50 feet with 2 turns activity    Assist        Assist Level: Dependent - Patient 0%   Wheelchair 150 feet activity     Assist      Assist Level: Dependent - Patient 0%   Blood pressure (!) 137/58, pulse 69, temperature 98 F (36.7 C), temperature source Oral, resp. rate 18, height 5' 2 (1.575 m), weight 83 kg, last menstrual period 10/30/2014, SpO2 100%.  Medical Problem List and Plan: 1. 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   Started when moved to this room - doesn't want to do MRI today- understands that since has been 2+ days, there's no medical intervention, but might try  blocking glasses.  Will let us  know if anything changes 2.  Antithrombotics: -DVT/anticoagulation:  Mechanical: Sequential compression devices, below knee Bilateral lower extremities Pharmaceutical: Eliquis 5 mg BID              -antiplatelet therapy: N/A 3. Pain Management: History of chronic pain syndrome on scheduled Lyrica  50 mg 3 times daily and oxycodone  10 mg as needed.          Monitor for any adverse effects on mentation. 4. Mood/Behavior/Sleep: LCSW to follow for evaluation and support when available.              -antipsychotic agents: Prozac --- holding Wellbutrin  XL  5. Neuropsych/cognition: This patient may be capable of making decisions on her own behalf. 6. Skin/Wound Care: Routine pressure relief measures.  7. Fluids/Electrolytes/Nutrition: Monitor intake and output.    Latest Ref Rng & Units 06/03/2024    5:58 AM 06/01/2024     5:18 AM 05/31/2024   12:11 AM  BMP  Glucose 70 - 99 mg/dL 860  832  712   BUN 8 - 23 mg/dL 8  7  7    Creatinine 0.44 - 1.00 mg/dL 9.32  9.31  9.19   Sodium 135 - 145 mmol/L 136  133  134   Potassium 3.5 - 5.1 mmol/L 4.0  4.3  4.0   Chloride 98 - 111 mmol/L 100  101  97   CO2 22 - 32 mmol/L 26  26    Calcium  8.9 - 10.3 mg/dL 8.6  8.5     Normal 89/8 8.  DVT: LE venous Doppler positive for right peroneal vein acute DVT on Effient  PTA now on Eliquis 5 mg twice daily.  9. T2DM: A1c 9.2 uncontrolled diabetes.  On home metformin 1000 mg BID -Monitor CBG ACHS with SSI.  Lantus  24 units - Change diet, carb modified.  CBG (last 3)  Recent Labs    06/05/24 1644 06/05/24 2123 06/06/24 0542  GLUCAP 219* 138* 148*  Am CBG controlled in fact on low side , reduce lantus  , increase mealtime novalog 10/3, at home was using Levimir 12U , with SSI and Metformin 10/4- BG slightly elevated this AM-  but doing a little better overall- con't regimen for now 10. HLD: LDL 40, Lipitor  80 mg daily 11.  CAD: NSTEMI s/p stenting cardiology agreeable with Eliquis alone 12.  Uterine CA: Followed by Dr. Mellie. Received 1 cycle of treatment with chemo-Keytruda  Taxol  carboplatin .  Repeat cycle wad due 06/02/24. F/u as OP  Chemo interrupted by hospital admissions 04/20/24,05/04/24,05/21/24, as well as present admit 05/30/24 13.  Chronic respiratory failure: Wears 2L at baseline and CPAP at night.  Continue guaifenesin  600 mg twice daily             - Incentive spirometer 14.  HTN: BP stable.  Long-term goal normotensive  10/4- BP controlled- con't regimen Vitals:   06/05/24 2120 06/06/24 0539  BP: 117/63 (!) 137/58  Pulse: 67 69  Resp: 18 18  Temp: 98 F (36.7 C) 98 F (36.7 C)  SpO2: 98% 100%    15.  Obesity: BMI 34.27, educate on diet and weight loss to promote overall health and mobility.  16. Intertrigo. Nystatin cream 17.  OSA. on CPAP at home  18.  Normochromic , normocytic anemia , abnormal x ~1 yr , ?  CA related , no recent hemocults, will order since pt is now on Eliquis   19. Double mirror vision  10/4- has had at least since 10/1 to 10/2- pt not sure, but is clear it's been at least 2 days- doesn't want another MRI at this time- will monitor for changes- didn't tell us  because scared we'd commit her.    I spent a total of 54   minutes on total care today- >50% coordination of care- due to  Pt seen twice; d/w PT and OT and nursing-   LOS: 3 days A FACE TO FACE EVALUATION WAS PERFORMED  Brenda [name] 06/06/2024, 11:13 AM

## 2024-06-06 NOTE — Progress Notes (Addendum)
 Occupational Therapy Session Note  Patient Details  Name: Brenda [name] MRN: [id number] Date of Birth: [birth year]  Today's Date: 06/06/2024 OT Individual Time: 1004-1059 OT Individual Time Calculation (min): 55 min    Short Term Goals: Week 1:  OT Short Term Goal 1 (Week 1): STG = LTG at Supervision 2/2 ELOS  Skilled Therapeutic Interventions/Progress Updates:  Pt greeted seated in w/c, pt agreeable to OT intervention.      Transfers/bed mobility/functional mobility:  Pt completed functional ambulation with no AD and CGA.  Therapeutic activity:  Pt completed sit>stand endurance task to challenge strength and endurance and further assess vision. Pt instructed to complete sit>stands while engaging in connect 4 task. Pt completed a total of 11 stands with supervision. Pt noted to present with L inattention during task and did not notice when she got 4 in a row but when pt reached forward to touch the empty slots pt then able to problem solve that here was nothing present. Pt endorses double vision but reports that it does not improve when she occludes one eye. Suspect pt is describing vertical binocular diplopia as pt reports the images appear stacked on each other.   Pt completed visual scanning task at [name] to further assess vision with pt instructed to stand to [name] to touch stimulus on screen in various visual quadrants. Pt completed task with 71% accuracy noted to under shoot and over shoot. Pt had 16 misses and needed 3 mins to complete task. Pt needed increased time to locate stimulus on L side ( 12 seconds). Education provided on L inattention and importance of scanning L>R to accommodate for deficit.    Education:  Discussed rehab goals with pt reporting wanting to be stronger to complete IADLs/ADLs as independently as possible.   Exercises: pt completed 4 mins and 21 secs on UE ergometer for global strengthening at the below settings: 8 total cal, .1 miles, 83 revolutions, 17  avg RPMs, 1.7 avg METS, 7 avg watts.  Cognition:               Pt reports 2 bathrooms in bed room, pt relating it to double vision, pt also tearful during session about visual changes. Provided emotional support and therapeutic use of self.   Ended session with pt supine in bed with all needs within reach and bed alarm activated.                    Therapy Documentation Precautions:  Precautions Precautions: Fall Restrictions Weight Bearing Restrictions Per Provider Order: No  Pain: Unrated pain reported in BLE and back, rest breaks provided as needed.    Therapy/Group: Individual Therapy  Ronal Mallie Needy 06/06/2024, 12:28 PM

## 2024-06-06 NOTE — Progress Notes (Signed)
 Physical Therapy Session Note  Patient Details  Name: Brenda [name] MRN: [id number] Date of Birth: Dec 19, 1960  Today's Date: 06/06/2024 PT Individual Time: 0805-0923 PT Individual Time Calculation (min): 78 min   Short Term Goals: Week 1:  PT Short Term Goal 1 (Week 1): = LTGs due to ELOS  Skilled Therapeutic Interventions/Progress Updates: Patient supine in bed on entrance to room. Patient alert and agreeable to PT session.   Patient with no complaints of pain. Pt reported some back soreness from therapy yesterday (therex performed to assist with pt reporting decrease in soreness). At end of session, pt reported seeing two bathrooms in sinks/mirror in room on opposite ends. Pt transported back to room in [facility] and instructed to draw what pt sees on paper. Pt drew mirror reflection of bathroom door (opposite side of room) and stated seeing the same from the sink (did no draw). Pt also reported history of not feeling safe to drive in the past due to seeing people or things that weren't there. Covering physician notified to follow up. Pt provided with active listening and embrace as pt became slightly sad about presentation. Attending nsg and present rehab team made aware as well.   Therapeutic Activity: Bed Mobility: Pt performed supine<sit on EOB with supervision for safety.  Transfers: Pt performed sit<>stand transfers throughout session with close supervision and no AD.  Pt ambulated roughly 150' with no AD and + 2 following with O2 tank and WC. Pt overall with CGA with brief moment of light minA due to gait drift and mild unsteadiness but no true LOB. Pt reported 6/10 modified RPE scale at end with rest break and education provided for pt to self assess when needing a rest break during interventions to conserve energy.   Neuromuscular Re-ed: B LE cone taps with overall minA (HHA) and cues to maintain neutral step width  - Pt now with soft colored cones and cues to tap called out color.  Pt progressed to no HHA and CGA/light minA. Pt required increased effort to achieve hip flexion + abduction ROM to tap can closer to lateral side, but able to do so after few attempts. Pt cued to tap one cone, and then another without stepping back to floor on last rep with no LOB. Sit<>stand with tidal tank in B UE's with VC for pt to keep water as level as possible while going through movement. Pt performed 2 rounds until close to fatigue with rest break required and overall supervision. Pt with increased cuing on 2nd round to increase L elbow flexion due to water pooling on L side (pt R handed dominant).   NMR performed for improvements in motor control and coordination, balance, sequencing, judgement, and self confidence/ efficacy in performing all aspects of mobility at highest level of independence.   Therapeutic Exercise: Pt performed the following exercises with therapist providing the described cuing and facilitation for improvement. - Supine hip rotation with B knees flexed and cues to maintain B shoulders on bed Step to and from (anterior to retro) aerobic step with B LE's and no UE support. Pt cued to ensure entire foot placed on step. Pt required overall minA for safety. 2 rounds performed with rest break required  Patient sitting in WC at end of session with brakes locked, chair alarm set, and all needs within reach.      Therapy Documentation Precautions:  Precautions Precautions: Fall Restrictions Weight Bearing Restrictions Per Provider Order: No  Therapy/Group: Individual Therapy  Keyden Pavlov PTA  06/06/2024, 10:44 AM

## 2024-06-06 NOTE — Progress Notes (Signed)
 Patient reports having vision problems. Patient states noticing a difference in the left eye. MD notified.   Aleck DELENA Apo, RN

## 2024-06-06 NOTE — Plan of Care (Signed)
  Problem: Consults Goal: RH STROKE PATIENT EDUCATION Description: See Patient Education module for education specifics  Outcome: Progressing   Problem: RH BOWEL ELIMINATION Goal: RH STG MANAGE BOWEL WITH ASSISTANCE Description: STG Manage Bowel with supervision Assistance. Outcome: Progressing   Problem: RH BLADDER ELIMINATION Goal: RH STG MANAGE BLADDER WITH ASSISTANCE Description: STG Manage Bladder With supervision Assistance Outcome: Progressing

## 2024-06-06 NOTE — Plan of Care (Signed)
  Problem: Consults Goal: RH STROKE PATIENT EDUCATION Description: See Patient Education module for education specifics  Outcome: Progressing   Problem: RH BOWEL ELIMINATION Goal: RH STG MANAGE BOWEL WITH ASSISTANCE Description: STG Manage Bowel with supervision Assistance. Outcome: Progressing   Problem: RH BLADDER ELIMINATION Goal: RH STG MANAGE BLADDER WITH ASSISTANCE Description: STG Manage Bladder With supervision Assistance Outcome: Progressing   Problem: RH SKIN INTEGRITY Goal: RH STG SKIN FREE OF INFECTION/BREAKDOWN Description: Manage skin free of infection with supervision assistance Outcome: Progressing   Problem: RH SAFETY Goal: RH STG ADHERE TO SAFETY PRECAUTIONS W/ASSISTANCE/DEVICE Description: STG Adhere to Safety Precautions With supervision Assistance/Device. Outcome: Progressing   Problem: RH PAIN MANAGEMENT Goal: RH STG PAIN MANAGED AT OR BELOW [name] PAIN GOAL Description: <4 w/ prns Outcome: Progressing   Problem: RH KNOWLEDGE DEFICIT Goal: RH STG INCREASE KNOWLEDGE OF DIABETES Description: Manage increase knowledge of diabetes with supervision assistance from spouse using educational materials provided Outcome: Progressing Goal: RH STG INCREASE KNOWLEDGE OF HYPERTENSION Description: Manage increase knowledge of hypertension with supervision assistance from spouse using educational materials provided Outcome: Progressing Goal: RH STG INCREASE KNOWLEGDE OF HYPERLIPIDEMIA Description: Manage increase knowledge of hyperlipidemia with supervision assistance from spouse using educational materials provided Outcome: Progressing Goal: RH STG INCREASE KNOWLEDGE OF STROKE PROPHYLAXIS Description: Manage increase knowledge of stroke prophylaxis with supervision assistance from spouse using educational materials provided Outcome: Progressing

## 2024-06-06 NOTE — Progress Notes (Addendum)
 Speech Language Pathology Daily Session Note  Patient Details  Name: Brenda [name] MRN: [id number] Date of Birth: 04-16-1961  Today's Date: 06/06/2024 SLP Individual Time: 1231-1330 SLP Individual Time Calculation (min): 59 min  Short Term Goals: Week 1: SLP Short Term Goal 1 (Week 1): STGs = LTGs d/t ELOS  Skilled Therapeutic Interventions: SLP conducted skilled therapy session targeting cognitive goals. SLP greeted patient who was finishing lunch, thus verbal tasks conducted during first part of the session to allow for patient to continue with meal. Patient recalled broad events from earlier therapy sessions with supervision but required min to mod assist to recall specific details. Patient states awareness of visual deficits and also remarks upon prior confusion that has since cleared per her report. After completion of meal, SLP facilitated scanning and sustained attention task where patient benefited from min assist for thoroughness, scanning to the L, and orientation to task details. She required mod assist for task organization and sequencing. In remaining minutes of session, SLP facilitated verbal problem solving task where patient benefited from mod to max assist to solve basic time management-based word problems. Patient was left in room with call bell in reach and alarm set. SLP will continue to target goals per plan of care.        Pain None   Therapy/Group: Individual Therapy  Latavious Bitter, M.A., CCC-SLP  Chevez Sambrano A Prim Morace 06/06/2024, 1:37 PM

## 2024-06-07 DIAGNOSIS — I6340 Cerebral infarction due to embolism of unspecified cerebral artery: Secondary | ICD-10-CM | POA: Diagnosis not present

## 2024-06-07 LAB — GLUCOSE, CAPILLARY
Glucose-Capillary: 95 mg/dL (ref 70–99)
Glucose-Capillary: 111 mg/dL — ABNORMAL HIGH (ref 70–99)
Glucose-Capillary: 58 mg/dL — ABNORMAL LOW (ref 70–99)
Glucose-Capillary: 212 mg/dL — ABNORMAL HIGH (ref 70–99)
Glucose-Capillary: 215 mg/dL — ABNORMAL HIGH (ref 70–99)

## 2024-06-07 MED ORDER — INSULIN GLARGINE 100 UNIT/ML ~~LOC~~ SOLN
18.0000 [IU] | Freq: Every day | SUBCUTANEOUS | Status: DC
  Administered 2024-06-07 – 2024-06-08 (×2): 18 [IU] via SUBCUTANEOUS
  Filled 2024-06-07 (×3): qty 0.18

## 2024-06-07 NOTE — Plan of Care (Signed)
  Problem: Consults Goal: RH STROKE PATIENT EDUCATION Description: See Patient Education module for education specifics  Outcome: Progressing   Problem: RH BOWEL ELIMINATION Goal: RH STG MANAGE BOWEL WITH ASSISTANCE Description: STG Manage Bowel with supervision Assistance. Outcome: Progressing   Problem: RH BLADDER ELIMINATION Goal: RH STG MANAGE BLADDER WITH ASSISTANCE Description: STG Manage Bladder With supervision Assistance Outcome: Progressing

## 2024-06-07 NOTE — Progress Notes (Addendum)
 PROGRESS NOTE   Subjective/Complaints:  Pt reports still has double vision, but only with certain things.  Maybe slightly improved? LBM yesterday Not real hungry this AM, so ate 50% of tray.    ROS-   Per HPI Pt denies SOB, abd pain, CP, N/V/C/D, and  (+)vision changes   Objective:   No results found.  No results for input(s): WBC, HGB, HCT, PLT in the last 72 hours.  No results for input(s): NA, K, CL, CO2, GLUCOSE, BUN, CREATININE, CALCIUM  in the last 72 hours.   Intake/Output Summary (Last 24 hours) at 06/07/2024 1128 Last data filed at 06/07/2024 0732 Gross per 24 hour  Intake 240 ml  Output --  Net 240 ml        Physical Exam: Vital Signs Blood pressure (!) 140/62, pulse 77, temperature 98.7 F (37.1 C), temperature source Oral, resp. rate 17, height 5' 2 (1.575 m), weight 83 kg, last menstrual period 10/30/2014, SpO2 98%.     General: awake, alert, appropriate,  on side laying down in bed; 50% tray eaten;  NAD HENT: conjugate gaze; oropharynx moist; wearing 2L O2 by [place] CV: regular rate and rhythm; no JVD Pulmonary: decreased throughout, no W/R/R heard GI: soft, NT, ND, (+)BS Psychiatric: appropriate; pleasant, interactive Neurological: mild delayed responses-   Skin: No evidence of breakdown, no evidence of rash Neurologic:SPeech - neg dysarthria, intact naming  Cranial nerves II through XII intact, motor strength is 5/5 in bilateral deltoid, bicep, tricep, grip, hip flexor, knee extensors, ankle dorsiflexor and plantar flexor Sensory exam normal sensation to light touch in bilateral upper and lower extremities Cerebellar exam normal finger to nose to finger Musculoskeletal: Full range of motion in all 4 extremities. No joint swelling   Assessment/Plan: 1. Functional deficits which require 3+ hours per day of interdisciplinary therapy in a comprehensive inpatient rehab  setting. Physiatrist is providing close team supervision and 24 hour management of active medical problems listed below. Physiatrist and rehab team continue to assess barriers to discharge/monitor patient progress toward functional and medical goals  Care Tool:  Bathing    Body parts bathed by patient: Right arm, Left arm, Chest, Abdomen, Right upper leg, Left upper leg, Face, Buttocks, Front perineal area, Right lower leg, Left lower leg   Body parts bathed by helper: Right lower leg, Left lower leg     Bathing assist Assist Level: Supervision/Verbal cueing     Upper Body Dressing/Undressing Upper body dressing   What is the patient wearing?: Dress    Upper body assist Assist Level: Supervision/Verbal cueing    Lower Body Dressing/Undressing Lower body dressing      What is the patient wearing?: Pants, Underwear/pull up     Lower body assist Assist for lower body dressing: Minimal Assistance - Patient > 75%     Toileting Toileting    Toileting assist Assist for toileting: Supervision/Verbal cueing     Transfers Chair/bed transfer  Transfers assist     Chair/bed transfer assist level: Contact Guard/Touching assist     Locomotion Ambulation   Ambulation assist      Assist level: Minimal Assistance - Patient > 75% Assistive device: No Device  Walk 10 feet activity   Assist     Assist level: Minimal Assistance - Patient > 75% Assistive device: No Device   Walk 50 feet activity   Assist    Assist level: Minimal Assistance - Patient > 75% Assistive device: No Device    Walk 150 feet activity   Assist Walk 150 feet activity did not occur: Safety/medical concerns (endurance)         Walk 10 feet on uneven surface  activity   Assist     Assist level: Minimal Assistance - Patient > 75%     Wheelchair     Assist Is the patient using a wheelchair?: Yes Type of Wheelchair: Manual    Wheelchair assist level: Dependent -  Patient 0%      Wheelchair 50 feet with 2 turns activity    Assist        Assist Level: Dependent - Patient 0%   Wheelchair 150 feet activity     Assist      Assist Level: Dependent - Patient 0%   Blood pressure (!) 140/62, pulse 77, temperature 98.7 F (37.1 C), temperature source Oral, resp. rate 17, height 5' 2 (1.575 m), weight 83 kg, last menstrual period 10/30/2014, SpO2 98%.  Medical Problem List and Plan: 1. 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   Started when moved to this room - doesn't want to do MRI today- understands that since has been 2+ days, there's no medical intervention, but might try  blocking glasses.  Will let us  know if anything changes  Is stable- doesn't want MRI 2.  Antithrombotics: -DVT/anticoagulation:  Mechanical: Sequential compression devices, below knee Bilateral lower extremities Pharmaceutical: Eliquis 5 mg BID              -antiplatelet therapy: N/A 3. Pain Management: History of chronic pain syndrome on scheduled Lyrica  50 mg 3 times daily and oxycodone  10 mg as needed.          Monitor for any adverse effects on mentation.  10/5- denies significant pain 4. Mood/Behavior/Sleep: LCSW to follow for evaluation and support when available.              -antipsychotic agents: Prozac --- holding Wellbutrin  XL  5. Neuropsych/cognition: This patient may be capable of making decisions on her own behalf. 6. Skin/Wound Care: Routine pressure relief measures.  7. Fluids/Electrolytes/Nutrition: Monitor intake and output.    Latest Ref Rng & Units 06/03/2024    5:58 AM 06/01/2024    5:18 AM  05/31/2024   12:11 AM  BMP  Glucose 70 - 99 mg/dL 860  832  712   BUN 8 - 23 mg/dL 8  7  7    Creatinine 0.44 - 1.00 mg/dL 9.32  9.31  9.19   Sodium 135 - 145 mmol/L 136  133  134   Potassium 3.5 - 5.1 mmol/L 4.0  4.3  4.0   Chloride 98 - 111 mmol/L 100  101  97   CO2 22 - 32 mmol/L 26  26  Calcium  8.9 - 10.3 mg/dL 8.6  8.5     Normal 89/8 8.  DVT: LE venous Doppler positive for right peroneal vein acute DVT on Effient  PTA now on Eliquis 5 mg twice daily.  9. T2DM: A1c 9.2 uncontrolled diabetes.  On home metformin 1000 mg BID -Monitor CBG ACHS with SSI.  Lantus  24 units - Change diet, carb modified.  CBG (last 3)  Recent Labs    06/06/24 1734 06/06/24 2132 06/07/24 0624  GLUCAP 101* 120* 95  Am CBG controlled in fact on low side , reduce lantus  , increase mealtime novalog 10/3, at home was using Levimir 12U , with SSI and Metformin 10/4- BG slightly elevated this AM-  but doing a little better overall- con't regimen for now 10/5- BG's looking well  10. HLD: LDL 40, Lipitor  80 mg daily 11.  CAD: NSTEMI s/p stenting cardiology agreeable with Eliquis alone 12.  Uterine CA: Followed by Dr. Mellie. Received 1 cycle of treatment with chemo-Keytruda  Taxol  carboplatin .  Repeat cycle wad due 06/02/24. F/u as OP  Chemo interrupted by hospital admissions 04/20/24,05/04/24,05/21/24, as well as present admit 05/30/24 13.  Chronic respiratory failure: Wears 2L at baseline and CPAP at night.  Continue guaifenesin  600 mg twice daily             - Incentive spirometer 14.  HTN: BP stable.  Long-term goal normotensive  10/4- BP controlled- con't regimen  10/5- BP very slightly elevated- con't regimen Vitals:   06/06/24 2020 06/07/24 0625  BP: (!) 149/47 (!) 140/62  Pulse: 81 77  Resp: 18 17  Temp: 98.3 F (36.8 C) 98.7 F (37.1 C)  SpO2: 100% 98%    15.  Obesity: BMI 34.27, educate on diet and weight loss to promote overall health and mobility.  16. Intertrigo. Nystatin cream 17.  OSA. on  CPAP at home  18.  Normochromic , normocytic anemia , abnormal x ~1 yr , ? CA related , no recent hemocults, will order since pt is now on Eliquis   19. Double mirror vision  10/4- has had at least since 10/1 to 10/2- pt not sure, but is clear it's been at least 2 days- doesn't want another MRI at this time- will monitor for changes- didn't tell us  because scared we'd commit her.   10/5- no worsening- wants to wait on MRI    Addendum- called with BG of 58- decreased her Lantus  to 18 units- will also have them hold her 4 units with meals for lunch   I spent a total of 45    minutes on total care today- >50% coordination of care- due to  D/w pt about her Sx's- double vision- also about bowels- also d/w nursing as wlel- reviewed vitals and B/B LOS: 4 days A FACE TO FACE EVALUATION WAS PERFORMED  Brenda [name] 06/07/2024, 11:28 AM

## 2024-06-07 NOTE — Progress Notes (Signed)
 Hypoglycemic Event  CBG: 58  Treatment: 8 oz juice/soda  Symptoms: None  Follow-up CBG: Time: 1211 CBG Result: 111  Possible Reasons for Event: Unknown  Comments/MD notified: Yes    Aleck A Heidy Mccubbin

## 2024-06-07 NOTE — Progress Notes (Signed)
 Occupational Therapy Session Note  Patient Details  Name: Brenda [name] MRN: [id number] Date of Birth: Jan 09, 1961  Today's Date: 06/07/2024 OT Individual Time: 9194-9154 OT Individual Time Calculation (min): 40 min    Short Term Goals: Week 1:  OT Short Term Goal 1 (Week 1): STG = LTG at Supervision 2/2 ELOS  Skilled Therapeutic Interventions/Progress Updates:      Therapy Documentation Precautions:  Precautions Precautions: Fall Restrictions Weight Bearing Restrictions Per Provider Order: No General: Pt supine in bed upon OT arrival, agreeable to OT session. On 2L O2 via [place] during session.  Pain: no pain reported  ADL: OT providing skilled intervention on ADL retraining in order to increase independence with tasks and increase activity tolerance. Pt completed the following tasks at the current level of assist: Bed mobility: SBA from flat bed  Transfers: CGA with no AD stand step transfers from bed>W/C then at end of session W/C>recliner with no LOB/SOB  Exercises: Pt completed the following exercise circuit in order to improve functional activity, strength and endurance to prepare for ADLs such as bathing. Pt completed the following exercises in seated position with no noted LOB/SOB and 3x10 repetitions on each exercise: -bicep curls with 4# dowel rod -triceps extensions with green theraband -shoulder abduction with green theraband -chest press with 4# dowel rod -shoulder flexion with 4# dowel rod   Pt seated in recliner at end of session with  call light within reach and 4Ps assessed.    Therapy/Group: Individual Therapy  Camie Hoe, OTD, OTR/L 06/07/2024, 12:56 PM

## 2024-06-07 NOTE — Plan of Care (Signed)
  Problem: Consults Goal: RH STROKE PATIENT EDUCATION Description: See Patient Education module for education specifics  Outcome: Progressing   Problem: RH BOWEL ELIMINATION Goal: RH STG MANAGE BOWEL WITH ASSISTANCE Description: STG Manage Bowel with supervision Assistance. Outcome: Progressing   Problem: RH BLADDER ELIMINATION Goal: RH STG MANAGE BLADDER WITH ASSISTANCE Description: STG Manage Bladder With supervision Assistance Outcome: Progressing   Problem: RH SKIN INTEGRITY Goal: RH STG SKIN FREE OF INFECTION/BREAKDOWN Description: Manage skin free of infection with supervision assistance Outcome: Progressing   Problem: RH SAFETY Goal: RH STG ADHERE TO SAFETY PRECAUTIONS W/ASSISTANCE/DEVICE Description: STG Adhere to Safety Precautions With supervision Assistance/Device. Outcome: Progressing   Problem: RH PAIN MANAGEMENT Goal: RH STG PAIN MANAGED AT OR BELOW [name] PAIN GOAL Description: <4 w/ prns Outcome: Progressing   Problem: RH KNOWLEDGE DEFICIT Goal: RH STG INCREASE KNOWLEDGE OF DIABETES Description: Manage increase knowledge of diabetes with supervision assistance from spouse using educational materials provided Outcome: Progressing Goal: RH STG INCREASE KNOWLEDGE OF HYPERTENSION Description: Manage increase knowledge of hypertension with supervision assistance from spouse using educational materials provided Outcome: Progressing Goal: RH STG INCREASE KNOWLEGDE OF HYPERLIPIDEMIA Description: Manage increase knowledge of hyperlipidemia with supervision assistance from spouse using educational materials provided Outcome: Progressing Goal: RH STG INCREASE KNOWLEDGE OF STROKE PROPHYLAXIS Description: Manage increase knowledge of stroke prophylaxis with supervision assistance from spouse using educational materials provided Outcome: Progressing

## 2024-06-08 DIAGNOSIS — I639 Cerebral infarction, unspecified: Secondary | ICD-10-CM

## 2024-06-08 DIAGNOSIS — C55 Malignant neoplasm of uterus, part unspecified: Secondary | ICD-10-CM

## 2024-06-08 DIAGNOSIS — E1165 Type 2 diabetes mellitus with hyperglycemia: Secondary | ICD-10-CM | POA: Diagnosis not present

## 2024-06-08 DIAGNOSIS — R414 Neurologic neglect syndrome: Secondary | ICD-10-CM | POA: Diagnosis not present

## 2024-06-08 DIAGNOSIS — F4322 Adjustment disorder with anxiety: Secondary | ICD-10-CM

## 2024-06-08 DIAGNOSIS — I6340 Cerebral infarction due to embolism of unspecified cerebral artery: Secondary | ICD-10-CM | POA: Diagnosis not present

## 2024-06-08 DIAGNOSIS — Z794 Long term (current) use of insulin: Secondary | ICD-10-CM

## 2024-06-08 LAB — BASIC METABOLIC PANEL WITH GFR
Anion gap: 12 (ref 5–15)
BUN: 9 mg/dL (ref 8–23)
CO2: 24 mmol/L (ref 22–32)
Calcium: 9 mg/dL (ref 8.9–10.3)
Chloride: 96 mmol/L — ABNORMAL LOW (ref 98–111)
Creatinine, Ser: 0.76 mg/dL (ref 0.44–1.00)
GFR, Estimated: >60 mL/min (ref >60)
Glucose, Bld: 205 mg/dL — ABNORMAL HIGH (ref 70–99)
Potassium: 4 mmol/L (ref 3.5–5.1)
Sodium: 132 mmol/L — ABNORMAL LOW (ref 135–145)

## 2024-06-08 LAB — CBC WITH DIFFERENTIAL/PLATELET
Abs Immature Granulocytes: 0.02 K/uL (ref 0.00–0.07)
Basophils Absolute: 0.1 K/uL (ref 0.0–0.1)
Basophils Relative: 1 %
Eosinophils Absolute: 0.1 K/uL (ref 0.0–0.5)
Eosinophils Relative: 2 %
HCT: 31.4 % — ABNORMAL LOW (ref 36.0–46.0)
Hemoglobin: 9.8 g/dL — ABNORMAL LOW (ref 12.0–15.0)
Immature Granulocytes: 0 %
Lymphocytes Relative: 20 %
Lymphs Abs: 1.4 K/uL (ref 0.7–4.0)
MCH: 27.4 pg (ref 26.0–34.0)
MCHC: 31.2 g/dL (ref 30.0–36.0)
MCV: 87.7 fL (ref 80.0–100.0)
Monocytes Absolute: 0.4 K/uL (ref 0.1–1.0)
Monocytes Relative: 6 %
Neutro Abs: 4.9 K/uL (ref 1.7–7.7)
Neutrophils Relative %: 71 %
Platelets: 268 K/uL (ref 150–400)
RBC: 3.58 MIL/uL — ABNORMAL LOW (ref 3.87–5.11)
RDW: 17.2 % — ABNORMAL HIGH (ref 11.5–15.5)
WBC: 6.9 K/uL (ref 4.0–10.5)
nRBC: 0 % (ref 0.0–0.2)

## 2024-06-08 LAB — GLUCOSE, CAPILLARY
Glucose-Capillary: 152 mg/dL — ABNORMAL HIGH (ref 70–99)
Glucose-Capillary: 187 mg/dL — ABNORMAL HIGH (ref 70–99)
Glucose-Capillary: 164 mg/dL — ABNORMAL HIGH (ref 70–99)
Glucose-Capillary: 91 mg/dL (ref 70–99)

## 2024-06-08 MED ORDER — INSULIN ASPART 100 UNIT/ML IJ SOLN
5.0000 [IU] | Freq: Three times a day (TID) | INTRAMUSCULAR | Status: DC
  Administered 2024-06-08 – 2024-06-11 (×9): 5 [IU] via SUBCUTANEOUS

## 2024-06-08 NOTE — Progress Notes (Signed)
 Occupational Therapy Session Note  Patient Details  Name: Brenda [name] MRN: [id number] Date of Birth: 1961-01-13  Today's Date: 06/08/2024 OT Individual Time: 1305-1340 OT Individual Time Calculation (min): 35 min    Short Term Goals: Week 1:  OT Short Term Goal 1 (Week 1): STG = LTG at Supervision 2/2 ELOS  Skilled Therapeutic Interventions/Progress Updates:    Pt received in bed and needed min A to sit to EOB.   She talked about upper back/neck muscular pain.  Had pt engage in several stretches for her neck that she can use on her own.  Discussed the diplopia she experienced on Friday afternoon, she does not have it today and was able to see items in the distance with no diplopia.  Adjusted her phone text font larger so she could see more easily, no diplopia with near vision.  Suggested it may have occurred due to fatigue.  Suggested she take note if it happens again when it occurred and how she was feeling.   Pt then ambulated to toilet and used toilet, washed hands at sink with Superv, Sat in wc to engage in sh exercises with green theraband focusing on arm pull outs.   Pt in wc. All needs met.  Call light in reach and alarm set   Therapy Documentation Precautions:  Precautions Precautions: Fall Restrictions Weight Bearing Restrictions Per Provider Order: No Pain: Pain Assessment Pain Scale: 0-10 Pain Score: 7  Pain Type: Chronic pain Pain Location: Back Pain Orientation: Left Pain Descriptors / Indicators: Aching Pain Intervention(s): Medication (See eMAR) ADL: ADL Eating: Set up Grooming: Supervision/safety Upper Body Bathing: Supervision/safety Where Assessed-Upper Body Bathing: Shower Lower Body Bathing: Supervision/safety Where Assessed-Lower Body Bathing: Shower (seated on Tub bench) Upper Body Dressing: Supervision/safety Lower Body Dressing: Contact guard Toileting: Supervision/safety Where Assessed-Toileting: Advice worker,  Minimal assistance Toilet Transfer Method: Event organiser: Contact guard, Minimal assistance Film/video editor Method: Designer, industrial/product: Emergency planning/management officer, Grab bars     Therapy/Group: Individual Therapy  Terilyn Sano 06/08/2024, 2:12 PM

## 2024-06-08 NOTE — Progress Notes (Signed)
 PROGRESS NOTE   Subjective/Complaints:     ROS-   Per HPI Pt denies SOB, abd pain, CP, N/V/C/D, and  (+)vision changes   Objective:   No results found.  No results for input(s): WBC, HGB, HCT, PLT in the last 72 hours.  No results for input(s): NA, K, CL, CO2, GLUCOSE, BUN, CREATININE, CALCIUM  in the last 72 hours.   Intake/Output Summary (Last 24 hours) at 06/08/2024 0820 Last data filed at 06/07/2024 1255 Gross per 24 hour  Intake 236 ml  Output --  Net 236 ml        Physical Exam: Vital Signs Blood pressure 139/63, pulse 90, temperature 97.7 F (36.5 C), resp. rate 19, height 5' 2 (1.575 m), weight 83 kg, last menstrual period 10/30/2014, SpO2 96%.   General: No acute distress Mood and affect are appropriate Heart: Regular rate and rhythm no rubs murmurs or extra sounds Lungs: Clear to auscultation, breathing unlabored, no rales or wheezes Abdomen: Positive bowel sounds, soft nontender to palpation, nondistended Extremities: No clubbing, cyanosis, or edema Skin: No evidence of breakdown, no evidence of rash Neurologic: Cranial nerves II through XII intact, motor strength is 5/5 in bilateral deltoid, bicep, tricep, grip, hip flexor, knee extensors, ankle dorsiflexor and plantar flexor Sensory exam normal sensation to light touch and proprioception in bilateral upper and lower extremities Cerebellar exam normal finger to nose to finger as well as heel to shin in bilateral upper and lower extremities Musculoskeletal: Full range of motion in all 4 extremities. No joint swelling    Assessment/Plan: 1. Functional deficits which require 3+ hours per day of interdisciplinary therapy in a comprehensive inpatient rehab setting. Physiatrist is providing close team supervision and 24 hour management of active medical problems listed below. Physiatrist and rehab team continue to assess  barriers to discharge/monitor patient progress toward functional and medical goals  Care Tool:  Bathing    Body parts bathed by patient: Right arm, Left arm, Chest, Abdomen, Right upper leg, Left upper leg, Face, Buttocks, Front perineal area, Right lower leg, Left lower leg   Body parts bathed by helper: Right lower leg, Left lower leg     Bathing assist Assist Level: Supervision/Verbal cueing     Upper Body Dressing/Undressing Upper body dressing   What is the patient wearing?: Dress    Upper body assist Assist Level: Supervision/Verbal cueing    Lower Body Dressing/Undressing Lower body dressing      What is the patient wearing?: Pants, Underwear/pull up     Lower body assist Assist for lower body dressing: Minimal Assistance - Patient > 75%     Toileting Toileting    Toileting assist Assist for toileting: Supervision/Verbal cueing     Transfers Chair/bed transfer  Transfers assist     Chair/bed transfer assist level: Contact Guard/Touching assist     Locomotion Ambulation   Ambulation assist      Assist level: Minimal Assistance - Patient > 75% Assistive device: No Device     Walk 10 feet activity   Assist     Assist level: Minimal Assistance - Patient > 75% Assistive device: No Device   Walk 50 feet activity  Assist    Assist level: Minimal Assistance - Patient > 75% Assistive device: No Device    Walk 150 feet activity   Assist Walk 150 feet activity did not occur: Safety/medical concerns (endurance)         Walk 10 feet on uneven surface  activity   Assist     Assist level: Minimal Assistance - Patient > 75%     Wheelchair     Assist Is the patient using a wheelchair?: Yes Type of Wheelchair: Manual    Wheelchair assist level: Dependent - Patient 0%      Wheelchair 50 feet with 2 turns activity    Assist        Assist Level: Dependent - Patient 0%   Wheelchair 150 feet activity      Assist      Assist Level: Dependent - Patient 0%   Blood pressure 139/63, pulse 90, temperature 97.7 F (36.5 C), resp. rate 19, height 5' 2 (1.575 m), weight 83 kg, last menstrual period 10/30/2014, SpO2 96%.  Medical Problem List and Plan: 1. 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  Antithrombotics: -DVT/anticoagulation:  Mechanical: Sequential compression devices, below knee Bilateral lower extremities Pharmaceutical: Eliquis 5 mg BID              -antiplatelet therapy: N/A 3. Pain Management: History of chronic pain syndrome on scheduled Lyrica  50 mg 3 times daily and oxycodone  10 mg as needed.          Monitor for any adverse effects on mentation.  10/5- denies significant pain 4. Mood/Behavior/Sleep: LCSW to follow for evaluation and support when available.              -antipsychotic agents: Prozac --- holding Wellbutrin  XL  5. Neuropsych/cognition: This patient may be capable of making decisions on her own behalf. 6. Skin/Wound Care: Routine pressure relief measures.  7. Fluids/Electrolytes/Nutrition: Monitor intake and output.    Latest Ref Rng & Units 06/03/2024    5:58 AM 06/01/2024    5:18 AM 05/31/2024   12:11 AM  BMP  Glucose 70 - 99 mg/dL 860  832  712   BUN 8 - 23 mg/dL 8  7  7    Creatinine 0.44 - 1.00 mg/dL 9.32  9.31  9.19   Sodium 135 - 145 mmol/L 136  133  134   Potassium 3.5 - 5.1 mmol/L 4.0  4.3  4.0   Chloride 98 - 111 mmol/L 100  101  97   CO2 22 - 32 mmol/L 26  26    Calcium  8.9 - 10.3 mg/dL 8.6  8.5     Normal 89/8 8.  DVT: LE venous Doppler positive for right peroneal vein acute DVT on Effient  PTA  now on Eliquis 5 mg twice daily.  9. T2DM: A1c 9.2 uncontrolled diabetes.  On home metformin 1000 mg BID -Monitor CBG ACHS with SSI.  Lantus  24 units - Change diet, carb modified.  CBG (last 3)  Recent Labs    06/07/24 1638 06/07/24 2053 06/08/24 9374  GLUCAP 212* 215* 152*  Am CBG ok but need to increase, mealtime novalog  10/4- BG slightly elevated this AM-  but doing a little better overall- con't regimen for now 10/5- BG's looking well  10. HLD: LDL 40, Lipitor  80 mg daily 11.  CAD: NSTEMI s/p stenting cardiology agreeable with Eliquis alone 12.  Uterine CA: Followed by Dr. Mellie. Received 1 cycle of treatment with chemo-Keytruda  Taxol  carboplatin .  Repeat cycle wad due 06/02/24. F/u as OP  Chemo interrupted by hospital admissions 04/20/24,05/04/24,05/21/24, as well as present admit 05/30/24 13.  Chronic respiratory failure: Wears 2L at baseline and CPAP at night.  Continue guaifenesin  600 mg twice daily             - Incentive spirometer 14.  HTN: BP stable.  Long-term goal normotensive  10/4- BP controlled- con't regimen  10/5- BP very slightly elevated- con't regimen Vitals:   06/07/24 2052 06/08/24 0454  BP: (!) 119/44 139/63  Pulse: 70 90  Resp: 17 19  Temp: 98.3 F (36.8 C) 97.7 F (36.5 C)  SpO2: 99% 96%    15.  Obesity: BMI 34.27, educate on diet and weight loss to promote overall health and mobility.  16. Intertrigo. Nystatin cream 17.  OSA. on CPAP at home  18.  Normochromic , normocytic anemia , abnormal x ~1 yr , ? CA related , no recent hemocults, will order since pt is now on Eliquis - still pnd  69. 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

## 2024-06-08 NOTE — Progress Notes (Signed)
 Occupational Therapy Session Note  Patient Details  Name: Brenda [name] MRN: [id number] Date of Birth: 07/27/1961  Today's Date: 06/08/2024 OT Individual Time: 1401-1500 OT Individual Time Calculation (min): 59 min    Short Term Goals: Week 1:  OT Short Term Goal 1 (Week 1): STG = LTG at Supervision 2/2 ELOS  Skilled Therapeutic Interventions/Progress Updates:  Pt greeted supine in bed, but with head at end of bed, pt unaware, pt agreeable to OT intervention.     Pt on 2L throughout session, SpO2 WFL Transfers/bed mobility/functional mobility:  Pt completed supine>sit with supervision. Pt completed functional ambulation with no AD and CGA.    Exercises: pt completed below BLE therex to facilitate improved global endurance and improve standing balance for LB dressing: -X10 step ups with unilateral support on R side.  -X10 toe taps with unilateral support on R side -X10 toe taps in horizontal pattern with BUE support on // bars with a focus on unilateral standing balance for improvements with independence with LB ADLs.  -Pt practice lateral steps with R and L side with BUE support to simulate navigating household entry way. Pt completed task with CGA but required MIN verbal cues for set- up and technique as pt tried to take larger steps than needed.   Pt completed standing balance task with pt instructed to toss ball to rebounder and catch/toss with a focus on challenging reactive balance. Pt completed x10 reps and then reports fatigue needing to rest.   Pt completed second set but graded task up and instructed pt to stand, then toss/catch ball, then return to sitting to challenge functional endurance. Pt able to complete x15 reps.                  Ended session with pt supine in bed with all needs within reach and bed alarm activated.                    Therapy Documentation Precautions:  Precautions Precautions: Fall Restrictions Weight Bearing Restrictions Per Provider  Order: No  Pain: Unrated general pain reported during session, provided rest breaks as needed.   Therapy/Group: Individual Therapy  Ronal Mallie Needy 06/08/2024, 3:14 PM

## 2024-06-08 NOTE — Consult Note (Signed)
 Neuropsychological Consultation Comprehensive Inpatient Rehab   Patient:   Brenda [name]   DOB:   1961/08/30  MR Number:  [id number]  Location:  [facility] [address] [place] [address] Dept: [phone] Loc: [phone]           Date of Service:   06/08/2024  Start Time:   8 AM End Time:   9 AM  Provider/Observer:  Norleen Asa, Psy.D.       Clinical Neuropsychologist       Billing Code/Service: [phone]  Reason for Service:    Brenda [name] is a 63 year old female referred for neuropsychological consultation during her ongoing admission to the inpatient rehabilitation unit. Referred for issues with coping and adjustment following a cerebral vascular accident (CVA) during an extended hospitalization.  History: Presented to [facility] via EMS on 05/31/2024 with mild confusion and word-finding difficulties, reported as baseline since chemotherapy. CT head suggested an acute to subacute infarct in the right temporal/occipital lobe. MRI confirmed embolic infarcts. Transferred to [facility] for further workup. Neurology initiated anticoagulation, with stroke attributed to a hypercoagulable state secondary to metastatic cancer. CT imaging showed recurrent disease in retroperitoneal lymph nodes and a lung mass suspicious for a metastatic lesion. EEG on 05/23/2024 showed mild diffuse slowing from global cerebral dysfunction. Admitted to the rehab unit following therapy evaluations due to decreased functional mobility.  Past Medical History: Hypertension, Stage IV metastatic uterine cancer, Type 2 diabetes, obstructive sleep apnea (on CPAP and oxygen ), chronic respiratory failure, CAD/NSTEMI s/p stenting (2021).  During today's clinical visit the patient reports feeling up and down. Expresses frustration and feels upset about new functional limitations, particularly with vision and being  unable to drive, describing it as one thing after another. Voices fear that the rehabilitation stay will delay cancer treatment, leading to disease progression. Worries about a catastrophic decline and tends to fall into the extreme of things. Also reports enjoying therapy activities and learning about her body's capabilities and limitations.   Medical History:   Past Medical History:  Diagnosis Date   Anemia    Arthritis    back, hands, hips (02/22/2015)   CAD (coronary artery disease)    a. complex LAD/diagonal bifurcation PCI in 2010. b. STEMI 10/2019 s/p PTCA/DES x1 to mLAD overlapping the old stent, residual disease treated medicaly.   Cancer [facility])    uterine   CHF (congestive heart failure) (HCC)    Depression    Diabetes mellitus (HCC)    started when I was pregnant; not sure if it was type 1 or type 2    History of radiation therapy    Endometrium- HDR 01/22/22-03/07/22- Dr. Lynwood Nasuti   Hypercholesterolemia    Hypertension    Morbid obesity (HCC)    Myocardial infarction (HCC)    mild x 3   Neuromuscular disorder (HCC)    neuropathy feet   Sleep apnea    cpap/         Patient Active Problem List   Diagnosis Date Noted   CVA (cerebral vascular accident) (HCC) 06/03/2024   Acute ischemic stroke (HCC) 05/31/2024   Brain lesion 05/31/2024   DKA (diabetic ketoacidosis) (HCC) 05/21/2024   Physical debility 05/12/2024   Metastatic malignant neoplasm (HCC) 05/05/2024   Community acquired pneumonia 05/04/2024   History of CAD (coronary artery disease) 05/04/2024   CAP (community acquired pneumonia) 05/04/2024   Stroke-like symptoms 04/21/2024   Acute focal  neurological deficit 04/20/2024   Cancer related pain 04/16/2024   Goals of care, counseling/discussion 04/16/2024   Other constipation 04/16/2024   Intractable low back pain 03/29/2024   Small bowel obstruction (HCC) 12/09/2021   SBO (small bowel obstruction) (HCC) 12/08/2021   Endometrial carcinoma (HCC)  12/06/2021   Nonspecific chest pain 10/19/2021   Dysfunctional uterine bleeding    Anemia    Chronic hypoxic respiratory failure (HCC) 03/22/2021   (HFpEF) heart failure with preserved ejection fraction (HCC) 02/09/2021   Chronic diastolic CHF (congestive heart failure) (HCC) 02/08/2021   Old MI (myocardial infarction) 10/26/2019   Anxiety with depression 03/28/2016   Type 2 diabetes mellitus with complication, with long-term current use of insulin  (HCC) 02/23/2015   Morbid obesity (HCC) 02/22/2015   Dyspnea 03/11/2013   Hyperlipidemia 05/25/2009   CAD (coronary artery disease) 05/25/2009   Essential hypertension 05/24/2009   Obstructive sleep apnea 05/24/2009    Behavioral Observation/Mental Status:   Alert and oriented. Mood appears euthymic with some underlying anxiety related to her medical condition and future. Engaged and cooperative throughout the session.   Family Med/Psych History:  Family History  Problem Relation Age of Onset   Coronary artery disease Mother        in her mid 26s   Hypertension Mother    Cancer Mother        skin   Heart attack Father        in his mid 79s   COPD Father    Stroke Sister    Coronary artery disease Sister    Diabetes Sister    Other Half-Sister    Thyroid  cancer Daughter    Prostate cancer Neg Hx    Colon cancer Neg Hx    Breast cancer Neg Hx    Endometrial cancer Neg Hx    Ovarian cancer Neg Hx    Pancreatic cancer Neg Hx     Impression/DX:   Coping with a complex medical course, including a new neurological injury (CVA) superimposed on a chronic oncology diagnosis. Experiences anticipatory anxiety and catastrophic thinking regarding potential delays in oncology care and the implications of new functional deficits. Currently, she is actively participating in and benefiting from rehabilitation therapies.  Patient is aware that her presence on the unit is a positive prognostic indicator, as candidates are selected based on their  expected potential for significant functional improvement.  Disposition/Plan:    1.  Provided psychoeducation regarding the role of the inpatient rehabilitation unit, emphasizing that the goal is to maximize functional recovery and that her stay will not negatively impact or delay necessary oncology care. Explained the high level of inter-departmental communication via EMR. 2.  Discussed the hurry up and wait nature of medical care to normalize her experience. 3.  Introduced a cognitive strategy to manage catastrophic thinking: anchoring thoughts to the present by adding the phrase right now to self-statements about current limitations (e.g., I can't drive right now). 4.  Reinforced the importance of CPAP adherence for post-stroke recovery. 5.  Emphasized the strategy of watching what they do, framing her admission to this specialized unit as a positive sign of her rehabilitation potential as determined by her multidisciplinary team. 6.  Advised on a post-discharge strategy of continuing to engage in challenging but safe activities daily, avoiding overexertion to prevent setbacks, based on the principle that consistent effort builds capacity. 7.  Will continue to follow for adjustment and coping during her rehabilitation stay.  Electronically Signed   _______________________ Norleen Asa, Psy.D. Clinical Neuropsychologist

## 2024-06-08 NOTE — Progress Notes (Addendum)
 Speech Language Pathology Daily Session Note  Patient Details  Name: Brenda [name] MRN: [id number] Date of Birth: 12/16/1960  Today's Date: 06/08/2024 SLP Individual Time: 1102-1200 SLP Individual Time Calculation (min): 58 min  Short Term Goals: Week 1: SLP Short Term Goal 1 (Week 1): STGs = LTGs d/t ELOS   Skilled Therapeutic Interventions:  Patient was seen in am to address cognitive re- training. Pt was alert and seated upright in WC upon SLP arrival. She was agreeable for session. Pt demonstrating some improved awareness of deficits stating changes in processing and memory. SLP challenging pt in an executive function medication tasks. Pt interpreted prescription labels presented verbally mod I and visually with increased complexity with min A. She was further challenged in identification of medication errors via BID pillbox. Some difficulty in task completion due to visual deficits though able to complete portion of task. She identified errors given min A. SLP further engaged pt in a word finding task given introduction of circumlocution strategies. Pt utilized circumlocution strategies in a structured task with sup A. At conclusion of session, pt was left upright in [facility] with call button within reach and chair alarm active. SLP to continue POC.   Pain Pain Assessment Pain Scale: 0-10 Pain Score: 6  Pain Type: Chronic pain Pain Location: Leg Pain Orientation: Left Pain Descriptors / Indicators: Aching  Therapy/Group: Individual Therapy  [name] 06/08/2024, 12:47 PM

## 2024-06-08 NOTE — Progress Notes (Signed)
 Physical Therapy Session Note  Patient Details  Name: Brenda [name] MRN: [id number] Date of Birth: November 09, 1960  Today's Date: 06/08/2024 PT Individual Time: 0930-1019 PT Individual Time Calculation (min): 49 min   Short Term Goals: Week 1:  PT Short Term Goal 1 (Week 1): = LTGs due to ELOS  Skilled Therapeutic Interventions/Progress Updates:    Pt performed bed mobility mod I during session. CGA for transfer without device to w/c for balance and management of O2. Focused on dynamic gait through obstacle course for balance retraining, visual deficits, gait, and home mobility with pt mangagin O2 tank to simulate what she does in community. Completed x 2 trials about 70' with overall CGA. Educated and discussed overall energy conservation techniques in regards to activity upon d/c, home mobility and continued importance of activity. NMR for balance retraining for toe taps to 6 step with CGA to min A for balance x 2 sets of 10 reps each - seated break between sets. Education on pain management - incorporation of stretching and use of heat. Returned to room and performed toilet transfers and toileting with overall S/CGA and handwashing at sink - mobility in room without device.  Therapy Documentation Precautions:  Precautions Precautions: Fall Restrictions Weight Bearing Restrictions Per Provider Order: No   Pain:   C/o pain in hips and back - relates it is chronic but also thinks it is muscular from new exercises/activity level. Educated on stretching tecnhnique and reports using heat at home - notified care team about ordering Kpad.   Balance: Five times Sit to Stand Test (FTSS) Method: Use a straight back chair with a solid seat that is 16-18" high. Ask participant to sit on the chair with arms folded across their chest.   Instructions: "Stand up and sit down as quickly as possible 5 times, keeping your arms folded across your chest."   Measurement: Stop timing when the participant  stands the 5th time.  TIME: __18____ (in seconds)  Times > 13.6 seconds is associated with increased disability and morbidity (Guralnik, 2000) Times > 15 seconds is predictive of recurrent falls in healthy individuals aged 76 and older (Buatois, et al., 2008) Normal performance values in community dwelling individuals aged 59 and older (Bohannon, 2006): 60-69 years: 11.4 seconds 70-79 years: 12.6 seconds 80-89 years: 14.8 seconds  MCID: >= 2.3 seconds for Vestibular Disorders (Meretta, 2006)     Therapy/Group: Individual Therapy  Elnor Pizza Sherrell Pizza WENDI Elnor, PT, DPT, CBIS  06/08/2024, 11:01 AM

## 2024-06-08 NOTE — Plan of Care (Signed)
  Problem: Consults Goal: RH STROKE PATIENT EDUCATION Description: See Patient Education module for education specifics  Outcome: Progressing   Problem: RH BOWEL ELIMINATION Goal: RH STG MANAGE BOWEL WITH ASSISTANCE Description: STG Manage Bowel with supervision Assistance. Outcome: Progressing   Problem: RH BLADDER ELIMINATION Goal: RH STG MANAGE BLADDER WITH ASSISTANCE Description: STG Manage Bladder With supervision Assistance Outcome: Progressing   Problem: RH SKIN INTEGRITY Goal: RH STG SKIN FREE OF INFECTION/BREAKDOWN Description: Manage skin free of infection with supervision assistance Outcome: Progressing   Problem: RH SAFETY Goal: RH STG ADHERE TO SAFETY PRECAUTIONS W/ASSISTANCE/DEVICE Description: STG Adhere to Safety Precautions With supervision Assistance/Device. Outcome: Progressing   Problem: RH PAIN MANAGEMENT Goal: RH STG PAIN MANAGED AT OR BELOW [name] PAIN GOAL Description: <4 w/ prns Outcome: Progressing   Problem: RH KNOWLEDGE DEFICIT Goal: RH STG INCREASE KNOWLEDGE OF DIABETES Description: Manage increase knowledge of diabetes with supervision assistance from spouse using educational materials provided Outcome: Progressing Goal: RH STG INCREASE KNOWLEDGE OF HYPERTENSION Description: Manage increase knowledge of hypertension with supervision assistance from spouse using educational materials provided Outcome: Progressing Goal: RH STG INCREASE KNOWLEGDE OF HYPERLIPIDEMIA Description: Manage increase knowledge of hyperlipidemia with supervision assistance from spouse using educational materials provided Outcome: Progressing Goal: RH STG INCREASE KNOWLEDGE OF STROKE PROPHYLAXIS Description: Manage increase knowledge of stroke prophylaxis with supervision assistance from spouse using educational materials provided Outcome: Progressing

## 2024-06-09 ENCOUNTER — Ambulatory Visit: Admitting: Hematology and Oncology

## 2024-06-09 ENCOUNTER — Inpatient Hospital Stay

## 2024-06-09 LAB — GLUCOSE, CAPILLARY
Glucose-Capillary: 75 mg/dL (ref 70–99)
Glucose-Capillary: 110 mg/dL — ABNORMAL HIGH (ref 70–99)
Glucose-Capillary: 123 mg/dL — ABNORMAL HIGH (ref 70–99)
Glucose-Capillary: 84 mg/dL (ref 70–99)

## 2024-06-09 MED ORDER — INSULIN GLARGINE 100 UNIT/ML ~~LOC~~ SOLN
15.0000 [IU] | Freq: Every day | SUBCUTANEOUS | Status: DC
  Administered 2024-06-09 – 2024-06-11 (×3): 15 [IU] via SUBCUTANEOUS
  Filled 2024-06-09 (×4): qty 0.15

## 2024-06-09 NOTE — Plan of Care (Signed)
  Problem: Consults Goal: RH STROKE PATIENT EDUCATION Description: See Patient Education module for education specifics  Outcome: Progressing   Problem: RH BOWEL ELIMINATION Goal: RH STG MANAGE BOWEL WITH ASSISTANCE Description: STG Manage Bowel with supervision Assistance. Outcome: Progressing   Problem: RH BLADDER ELIMINATION Goal: RH STG MANAGE BLADDER WITH ASSISTANCE Description: STG Manage Bladder With supervision Assistance Outcome: Progressing   Problem: RH SKIN INTEGRITY Goal: RH STG SKIN FREE OF INFECTION/BREAKDOWN Description: Manage skin free of infection with supervision assistance Outcome: Progressing   Problem: RH SAFETY Goal: RH STG ADHERE TO SAFETY PRECAUTIONS W/ASSISTANCE/DEVICE Description: STG Adhere to Safety Precautions With supervision Assistance/Device. Outcome: Progressing   Problem: RH PAIN MANAGEMENT Goal: RH STG PAIN MANAGED AT OR BELOW [name] PAIN GOAL Description: <4 w/ prns Outcome: Progressing   Problem: RH KNOWLEDGE DEFICIT Goal: RH STG INCREASE KNOWLEDGE OF DIABETES Description: Manage increase knowledge of diabetes with supervision assistance from spouse using educational materials provided Outcome: Progressing Goal: RH STG INCREASE KNOWLEDGE OF HYPERTENSION Description: Manage increase knowledge of hypertension with supervision assistance from spouse using educational materials provided Outcome: Progressing Goal: RH STG INCREASE KNOWLEGDE OF HYPERLIPIDEMIA Description: Manage increase knowledge of hyperlipidemia with supervision assistance from spouse using educational materials provided Outcome: Progressing Goal: RH STG INCREASE KNOWLEDGE OF STROKE PROPHYLAXIS Description: Manage increase knowledge of stroke prophylaxis with supervision assistance from spouse using educational materials provided Outcome: Progressing

## 2024-06-09 NOTE — Progress Notes (Signed)
 Physical Therapy Session Note  Patient Details  Name: Kenetha Cozza MRN: [id number] Date of Birth: 11/17/1960  Today's Date: 06/09/2024 PT Individual Time: 1300-1332 PT Individual Time Calculation (min): 32 min   Short Term Goals: Week 1:  PT Short Term Goal 1 (Week 1): = LTGs due to ELOS  Skilled Therapeutic Interventions/Progress Updates: Pt presents supine in bed and agreeable to therapy.  Pt w/o request for pain meds, saying It's the same as always.  Pt transfers sup to sit w/ Mod I.  Pt transfers sit to stand w/ supervision.  Pt amb x 125' to small gym w/o AD and CG/close supervision.  Pt does require cues for L inattention.  Pt requires seated rest break.  Pt maintained 100% and HR 88.  Pt performed car height transfer w/ supervision.  Pt amb up/down ramped surface w/o AD and CGA.  Pt returned to room and returned to supine w/ mod I.  Bed alarm on and all needs in reach, spouse present.     Therapy Documentation Precautions:  Precautions Precautions: Fall Restrictions Weight Bearing Restrictions Per Provider Order: No General:   Vital Signs: Therapy Vitals Temp: 98.3 F (36.8 C) Temp Source: Oral Pulse Rate: 73 Resp: 20 BP: (!) 119/52 Patient Position (if appropriate): Lying Oxygen  Therapy SpO2: 100 % O2 Device: Nasal Cannula O2 Flow Rate (L/min): 2 L/min Pain:4/10 Pain Assessment Pain Scale: 0-10 Pain Score: 0-No pain    Therapy/Group: Individual Therapy  Reyes SHAUNNA Sierra 06/09/2024, 1:35 PM

## 2024-06-09 NOTE — Progress Notes (Signed)
 Occupational Therapy Session Note  Patient Details  Name: Brenda [name] MRN: [id number] Date of Birth: Feb 05, 1961  Today's Date: 06/09/2024 OT Individual Time: 0830-0930 OT Individual Time Calculation (min): 60 min    Short Term Goals: Week 1:  OT Short Term Goal 1 (Week 1): STG = LTG at Supervision 2/2 ELOS  Skilled Therapeutic Interventions/Progress Updates:    Pt received in bed with her daughter present. Pt agreeable to a shower. Pt stated that at home, she doffs O2 when walking to the bathroom, toilets and showers.  Pt felt she could do that today. To have her practice L side awareness and safety awareness with tubing, had pt practice walking to bathroom managing tubing and then she was able to doff.  Continues to need a cue to hold the tubing to avoid catching with her feet. At home her tubing is green and is easier to see.  Overall pt completed all self care without A, only taking a few short rest breaks.  With no oxygen , she maintained sats of 97%.  She stated she needs the oxygen  more for when she is ambulating. She does need her glasses on to see where she is walking and orienting the clothing to don.  She said last night when she was tired she did not have diplopia, but did have some blurry vision. Discussed that is a sign she needs to close her eyes and rest for a while.   Discussed tub bench options.  Pt would like to have one for home.   Pt opted to sit on EOB to continue visiting with her daughter. All needs met.   Therapy Documentation Precautions:  Precautions Precautions: Fall Restrictions Weight Bearing Restrictions Per Provider Order: No Pain: Pain Assessment Pain Scale: 0-10 Pain Score: 7  Pain Location: Back Pain Intervention(s): Medication (See eMAR);Hot/Cold interventions ADL: ADL Eating: Set up Grooming: Supervision/safety Where Assessed-Grooming: Standing at sink Upper Body Bathing: Supervision/safety Where Assessed-Upper Body Bathing:  Shower Lower Body Bathing: Supervision/safety Where Assessed-Lower Body Bathing: Shower (seated on Tub bench) Upper Body Dressing: Supervision/safety Lower Body Dressing: Supervision/safety Toileting: Supervision/safety Where Assessed-Toileting: Teacher, adult education: Close supervision Statistician Method: Event organiser: Close supervision Film/video editor Method: Designer, industrial/product: Emergency planning/management officer, Grab bars   Therapy/Group: Individual Therapy  Jermario Kalmar 06/09/2024, 9:59 AM

## 2024-06-09 NOTE — Progress Notes (Signed)
 PROGRESS NOTE   Subjective/Complaints:   Discussed visual issues as well as rehab process , no new issues overnite  ROS-   Per HPI Pt denies SOB, abd pain, CP, N/V/C/D, and  (+)vision changes   Objective:   No results found.  Recent Labs    06/08/24 1359  WBC 6.9  HGB 9.8*  HCT 31.4*  PLT 268    Recent Labs    06/08/24 1359  NA 132*  K 4.0  CL 96*  CO2 24  GLUCOSE 205*  BUN 9  CREATININE 0.76  CALCIUM  9.0     Intake/Output Summary (Last 24 hours) at 06/09/2024 0744 Last data filed at 06/08/2024 1826 Gross per 24 hour  Intake 600 ml  Output --  Net 600 ml        Physical Exam: Vital Signs Blood pressure (!) 132/37, pulse 80, temperature 97.8 F (36.6 C), resp. rate 19, height 5' 2 (1.575 m), weight 83.2 kg, last menstrual period 10/30/2014, SpO2 96%.   General: No acute distress Mood and affect are appropriate Heart: Regular rate and rhythm no rubs murmurs or extra sounds Lungs: Clear to auscultation, breathing unlabored, no rales or wheezes Abdomen: Positive bowel sounds, soft nontender to palpation, nondistended Extremities: No clubbing, cyanosis, or edema Skin: No evidence of breakdown, no evidence of rash Neurologic: Cranial nerves II through XII intact, motor strength is 5/5 in bilateral deltoid, bicep, tricep, grip, hip flexor, knee extensors, ankle dorsiflexor and plantar flexor Left HH on confrontation  Sensory exam normal sensation to light touch and proprioception in bilateral upper and lower extremities Cerebellar exam normal finger to nose to finger as well as heel to shin in bilateral upper and lower extremities Musculoskeletal: Full range of motion in all 4 extremities. No joint swelling    Assessment/Plan: 1. Functional deficits which require 3+ hours per day of interdisciplinary therapy in a comprehensive inpatient rehab setting. Physiatrist is providing close team  supervision and 24 hour management of active medical problems listed below. Physiatrist and rehab team continue to assess barriers to discharge/monitor patient progress toward functional and medical goals  Care Tool:  Bathing    Body parts bathed by patient: Right arm, Left arm, Chest, Abdomen, Right upper leg, Left upper leg, Face, Buttocks, Front perineal area, Right lower leg, Left lower leg   Body parts bathed by helper: Right lower leg, Left lower leg     Bathing assist Assist Level: Supervision/Verbal cueing     Upper Body Dressing/Undressing Upper body dressing   What is the patient wearing?: Dress    Upper body assist Assist Level: Supervision/Verbal cueing    Lower Body Dressing/Undressing Lower body dressing      What is the patient wearing?: Pants, Underwear/pull up     Lower body assist Assist for lower body dressing: Minimal Assistance - Patient > 75%     Toileting Toileting    Toileting assist Assist for toileting: Supervision/Verbal cueing     Transfers Chair/bed transfer  Transfers assist     Chair/bed transfer assist level: Contact Guard/Touching assist     Locomotion Ambulation   Ambulation assist      Assist level: Contact Guard/Touching assist  Assistive device: No Device Max distance: 75'   Walk 10 feet activity   Assist     Assist level: Contact Guard/Touching assist Assistive device: No Device   Walk 50 feet activity   Assist    Assist level: Contact Guard/Touching assist Assistive device: No Device    Walk 150 feet activity   Assist Walk 150 feet activity did not occur: Safety/medical concerns (endurance)         Walk 10 feet on uneven surface  activity   Assist     Assist level: Minimal Assistance - Patient > 75%     Wheelchair     Assist Is the patient using a wheelchair?: Yes Type of Wheelchair: Manual    Wheelchair assist level: Dependent - Patient 0%      Wheelchair 50 feet with 2  turns activity    Assist        Assist Level: Dependent - Patient 0%   Wheelchair 150 feet activity     Assist      Assist Level: Dependent - Patient 0%   Blood pressure (!) 132/37, pulse 80, temperature 97.8 F (36.6 C), resp. rate 19, height 5' 2 (1.575 m), weight 83.2 kg, last menstrual period 10/30/2014, SpO2 96%.  Medical Problem List and Plan: 1. Functional deficits secondary to Bilateral anterior and posterior small infarcts embolic likely secondary to hypercoagulable state from advanced malignancy             -patient may shower             -ELOS/Goals: 7 to 10 days, PT OT SLP supervision to mod a  Con't CIR PT, OT and SLP Discussed visuoperceptual issues as part of parietal dysfunction post CVA , has left HH on exam    2.  Antithrombotics: -DVT/anticoagulation:  Mechanical: Sequential compression devices, below knee Bilateral lower extremities Pharmaceutical: Eliquis 5 mg BID              -antiplatelet therapy: N/A 3. Pain Management: History of chronic pain syndrome on scheduled Lyrica  50 mg 3 times daily and oxycodone  10 mg as needed.          Monitor for any adverse effects on mentation.  10/5- denies significant pain 4. Mood/Behavior/Sleep: LCSW to follow for evaluation and support when available.              -antipsychotic agents: Prozac --- holding Wellbutrin  XL  5. Neuropsych/cognition: This patient may be capable of making decisions on her own behalf. 6. Skin/Wound Care: Routine pressure relief measures.  7. Fluids/Electrolytes/Nutrition: Monitor intake and output.    Latest Ref Rng & Units 06/08/2024    1:59 PM 06/03/2024    5:58 AM 06/01/2024    5:18 AM  BMP  Glucose 70 - 99 mg/dL 794  860  832   BUN 8 - 23 mg/dL 9  8  7    Creatinine 0.44 - 1.00 mg/dL 9.23  9.32  9.31   Sodium 135 - 145 mmol/L 132  136  133   Potassium 3.5 - 5.1 mmol/L 4.0  4.0  4.3   Chloride 98 - 111 mmol/L 96  100  101   CO2 22 - 32 mmol/L 24  26  26    Calcium  8.9 - 10.3  mg/dL 9.0  8.6  8.5    Normal 10/1 8.  DVT: LE venous Doppler positive for right peroneal vein acute DVT on Effient  PTA now on Eliquis 5 mg twice daily.  9. T2DM: A1c 9.2 uncontrolled  diabetes.  On home metformin 1000 mg BID -Monitor CBG ACHS with SSI.   - Change diet, carb modified.  CBG (last 3)  Recent Labs    06/08/24 1636 06/08/24 2112 06/09/24 0609  GLUCAP 164* 91 75  Low CBG reduce lantus  to 15U 10. HLD: LDL 40, Lipitor  80 mg daily 11.  CAD: NSTEMI s/p stenting cardiology agreeable with Eliquis alone 12.  Uterine CA: Followed by Dr. Mellie. Received 1 cycle of treatment with chemo-Keytruda  Taxol  carboplatin .  Repeat cycle wad due 06/02/24. F/u as OP  Chemo interrupted by hospital admissions 04/20/24,05/04/24,05/21/24, as well as present admit 05/30/24 13.  Chronic respiratory failure: Wears 2L at baseline and CPAP at night.  Continue guaifenesin  600 mg twice daily             - Incentive spirometer 14.  HTN: BP stable.  Long-term goal normotensive  10/4- BP controlled- con't regimen  10/5- BP very slightly elevated- con't regimen Vitals:   06/08/24 2045 06/09/24 0432  BP: (!) 129/49 (!) 132/37  Pulse: 75 80  Resp: 18 19  Temp: 98.1 F (36.7 C) 97.8 F (36.6 C)  SpO2: 100% 96%    15.  Obesity: BMI 34.27, educate on diet and weight loss to promote overall health and mobility.  16. Intertrigo. Nystatin cream 17.  OSA. on CPAP at home  18.  Normochromic , normocytic anemia , abnormal x ~1 yr , ? CA related , no recent hemocults, will order since pt is now on Eliquis - still pnd  6. Visuoperceptual issues due to RIght parietal involvement post bilateral CVA with left negect + HH LOS: 6 days A FACE TO FACE EVALUATION WAS PERFORMED  Prentice FORBES Compton 06/09/2024, 7:44 AM

## 2024-06-09 NOTE — Progress Notes (Signed)
 Physical Therapy Session Note  Patient Details  Name: Brenda [name] MRN: [id number] Date of Birth: [birth year]  Today's Date: 06/09/2024 PT Individual Time: 1049-1201 PT Individual Time Calculation (min): 72 min   Short Term Goals: Week 1:  PT Short Term Goal 1 (Week 1): = LTGs due to ELOS  Skilled Therapeutic Interventions/Progress Updates: Patient supine in bed on entrance to room. Patient alert and agreeable to PT session.   Patient with no complaints of pain during session.   Therapeutic Activity: Bed Mobility: Pt performed supine<sit on EOB with supervision for safety. Transfers: Pt performed sit<>stand transfers throughout session with no AD and with CGA/close supervision for safety. Pt required one target cue to locate [name] to the L due to thinking that the table in room was the [name] (pt able to locate with hinted cue to visually track in room where chair is). Pt ambulated short distance in room with close supervision/light CGA and managed O2 tank. Pt closed door supervision for privacy and safety and performed personal care without assistance. Pt transported in [name] to therapy gym for energy conservation.   Neuromuscular Re-ed: NMR facilitated during session with focus on dynamic standing balance, weight shift, visual tracking. - [name] balance system with sensor calibrated around chest (anteriorly). Pt required minA at first to maintain standing balance for safety and to understand how to shift weight to keep on target. Pt required max target cuing to accurately locate ball to stay on due to diplopia and would often think she was on top of it but really to the L. Pt required to finish 2nd round final minute sitting due to fatigue.  - Pt performed dynamic standing balance - pursuit (lateral)  - 4 min x 2  - Time on Target = 57.52s; 57.99 second  - Accuracy = 23.96%; 24.16% - Optokinetic Dynamic - pursuit (rotator) - 6:05  - Reaction time = 4541 second - 8 hits  - Pt standing on  airex pad tossing pink air ball (light weight) to trampoline with overall CGA for safety (minA to step up to pad) and brief moments of minA due to posterior weight shift with cues to press toes into pad to shift forward.    NMR performed for improvements in motor control and coordination, balance, sequencing, judgement, and self confidence/ efficacy in performing all aspects of mobility at highest level of independence.   Patient sitting in WC at end of session with brakes locked, chair alarm set, and all needs within reach.      Therapy Documentation Precautions:  Precautions Precautions: Fall Restrictions Weight Bearing Restrictions Per Provider Order: No  Therapy/Group: Individual Therapy  Haleemah Buckalew PTA 06/09/2024, 12:20 PM

## 2024-06-09 NOTE — Progress Notes (Signed)
 Speech Language Pathology Daily Session Note  Patient Details  Name: Brenda [name] MRN: [id number] Date of Birth: April 28, 1961  Today's Date: 06/09/2024 SLP Individual Time: 1330-1415 SLP Individual Time Calculation (min): 45 min  Short Term Goals: Week 1: SLP Short Term Goal 1 (Week 1): STGs = LTGs d/t ELOS  Skilled Therapeutic Interventions:   Pt and her husband were greeted at bedside. SLP facilitated tx tasks targeting cognition. Pt was within 3 days of current date, but benefited from calendar to assist w/ detailed orientation review. She was able to recall 2 tx tasks completed in PT and OT w/ minA cues for recall of details. She then benefited from modA cues to complete written task targeting L visual attention and information processing. Of note, pt c/o fuzzy vision after ~3-5 mins of focusing on the visual field. She also completed a verbal task (adding 1 item to unidentified abstract category) targeting working memory, specific word finding, and information processing. She required only supervisionA for working memory and information processing; adequate naming and category ID noted. At the end of tx tasks, she was left in bed w/ the alarm set and call light within reach. Recommend cont ST per POC.    Pain Pain Assessment Pain Scale: 0-10 Pain Score: 0-No pain  Therapy/Group: Individual Therapy  Recardo DELENA Mole 06/09/2024, 2:08 PM

## 2024-06-10 LAB — GLUCOSE, CAPILLARY
Glucose-Capillary: 64 mg/dL — ABNORMAL LOW (ref 70–99)
Glucose-Capillary: 114 mg/dL — ABNORMAL HIGH (ref 70–99)
Glucose-Capillary: 160 mg/dL — ABNORMAL HIGH (ref 70–99)
Glucose-Capillary: 97 mg/dL (ref 70–99)
Glucose-Capillary: 46 mg/dL — ABNORMAL LOW (ref 70–99)
Glucose-Capillary: 46 mg/dL — ABNORMAL LOW (ref 70–99)
Glucose-Capillary: 62 mg/dL — ABNORMAL LOW (ref 70–99)
Glucose-Capillary: 61 mg/dL — ABNORMAL LOW (ref 70–99)
Glucose-Capillary: 69 mg/dL — ABNORMAL LOW (ref 70–99)
Glucose-Capillary: 70 mg/dL (ref 70–99)

## 2024-06-10 NOTE — Progress Notes (Signed)
 PROGRESS NOTE   Subjective/Complaints:  No problems overnite, taking oxy IR ~2 tab per day, constipation relieved by laxative yesterday   ROS-   Per HPI Pt denies SOB, abd pain, CP, N/V/C/D, and  (+)vision changes   Objective:   No results found.  Recent Labs    06/08/24 1359  WBC 6.9  HGB 9.8*  HCT 31.4*  PLT 268    Recent Labs    06/08/24 1359  NA 132*  K 4.0  CL 96*  CO2 24  GLUCOSE 205*  BUN 9  CREATININE 0.76  CALCIUM  9.0     Intake/Output Summary (Last 24 hours) at 06/10/2024 0747 Last data filed at 06/09/2024 1245 Gross per 24 hour  Intake 356 ml  Output --  Net 356 ml        Physical Exam: Vital Signs Blood pressure (!) 135/48, pulse 74, temperature 97.6 F (36.4 C), resp. rate 18, height 5' 2 (1.575 m), weight 83.2 kg, last menstrual period 10/30/2014, SpO2 100%.   General: No acute distress Mood and affect are appropriate Heart: Regular rate and rhythm no rubs murmurs or extra sounds Lungs: Clear to auscultation, breathing unlabored, no rales or wheezes Abdomen: Positive bowel sounds, soft nontender to palpation, nondistended Extremities: No clubbing, cyanosis, or edema Skin: No evidence of breakdown, no evidence of rash Neurologic: Cranial nerves II through XII intact, motor strength is 5/5 in bilateral deltoid, bicep, tricep, grip, hip flexor, knee extensors, ankle dorsiflexor and plantar flexor Left HH on confrontation  Sensory exam normal sensation to light touch and proprioception in bilateral upper and lower extremities Cerebellar exam normal finger to nose to finger as well as heel to shin in bilateral upper and lower extremities Musculoskeletal: Full range of motion in all 4 extremities. No joint swelling    Assessment/Plan: 1. Functional deficits which require 3+ hours per day of interdisciplinary therapy in a comprehensive inpatient rehab setting. Physiatrist is  providing close team supervision and 24 hour management of active medical problems listed below. Physiatrist and rehab team continue to assess barriers to discharge/monitor patient progress toward functional and medical goals  Care Tool:  Bathing    Body parts bathed by patient: Right arm, Left arm, Chest, Abdomen, Right upper leg, Left upper leg, Face, Buttocks, Front perineal area, Right lower leg, Left lower leg   Body parts bathed by helper: Right lower leg, Left lower leg     Bathing assist Assist Level: Supervision/Verbal cueing     Upper Body Dressing/Undressing Upper body dressing   What is the patient wearing?: Pull over shirt    Upper body assist Assist Level: Set up assist    Lower Body Dressing/Undressing Lower body dressing      What is the patient wearing?: Pants, Underwear/pull up     Lower body assist Assist for lower body dressing: Supervision/Verbal cueing     Toileting Toileting    Toileting assist Assist for toileting: Supervision/Verbal cueing     Transfers Chair/bed transfer  Transfers assist     Chair/bed transfer assist level: Supervision/Verbal cueing     Locomotion Ambulation   Ambulation assist      Assist level: Contact Guard/Touching assist  Assistive device: No Device Max distance: 125   Walk 10 feet activity   Assist     Assist level: Supervision/Verbal cueing Assistive device: No Device   Walk 50 feet activity   Assist    Assist level: Contact Guard/Touching assist Assistive device: No Device    Walk 150 feet activity   Assist Walk 150 feet activity did not occur: Safety/medical concerns (endurance)         Walk 10 feet on uneven surface  activity   Assist     Assist level: Minimal Assistance - Patient > 75%     Wheelchair     Assist Is the patient using a wheelchair?: Yes Type of Wheelchair: Manual    Wheelchair assist level: Dependent - Patient 0%      Wheelchair 50 feet with 2  turns activity    Assist        Assist Level: Dependent - Patient 0%   Wheelchair 150 feet activity     Assist      Assist Level: Dependent - Patient 0%   Blood pressure (!) 135/48, pulse 74, temperature 97.6 F (36.4 C), resp. rate 18, height 5' 2 (1.575 m), weight 83.2 kg, last menstrual period 10/30/2014, SpO2 100%.  Medical Problem List and Plan: 1. 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  Antithrombotics: -DVT/anticoagulation:  Mechanical: Sequential compression devices, below knee Bilateral lower extremities Pharmaceutical: Eliquis 5 mg BID              -antiplatelet therapy: N/A 3. Pain Management: History of chronic pain syndrome on scheduled Lyrica  50 mg 3 times daily and oxycodone  10 mg as needed.          Monitor for any adverse effects on mentation.  10/5- denies significant pain 4. Mood/Behavior/Sleep: LCSW to follow for evaluation and support when available.              -antipsychotic agents: Prozac --- holding Wellbutrin  XL  5. Neuropsych/cognition: This patient may be capable of making decisions on her own behalf. 6. Skin/Wound Care: Routine pressure relief measures.  7. Fluids/Electrolytes/Nutrition: Monitor intake and output.    Latest Ref Rng & Units 06/08/2024    1:59 PM 06/03/2024    5:58 AM 06/01/2024    5:18 AM  BMP  Glucose 70 - 99 mg/dL 794  860  832   BUN 8 - 23 mg/dL 9  8  7    Creatinine 0.44 - 1.00 mg/dL 9.23  9.32  9.31   Sodium 135 - 145 mmol/L 132  136  133   Potassium 3.5 - 5.1 mmol/L 4.0  4.0  4.3   Chloride 98 - 111 mmol/L 96  100  101   CO2 22 - 32 mmol/L 24  26  26    Calcium  8.9 - 10.3 mg/dL 9.0  8.6  8.5    Normal 10/1 8.   DVT: LE venous Doppler positive for right peroneal vein acute DVT on Effient  PTA now on Eliquis 5 mg twice daily.  9. T2DM: A1c 9.2 uncontrolled diabetes.  On home metformin  1000 mg BID -Monitor CBG ACHS with SSI.   - Change diet, carb modified.  CBG (last 3)  Recent Labs    06/09/24 2128 06/10/24 0448 06/10/24 0554  GLUCAP 84 64* 114*  Low CBG reduce lantus  to 15U- improved am CBG 10/8 10. HLD: LDL 40, Lipitor  80 mg daily 11.  CAD: NSTEMI s/p stenting cardiology agreeable with Eliquis alone 12.  Uterine CA: Followed by Dr. Lonn. Received 1 cycle of treatment with chemo-Keytruda  Taxol  carboplatin .  Repeat cycle wad due 06/02/24. F/u as OP  Chemo interrupted by hospital admissions 04/20/24,05/04/24,05/21/24, as well as present admit 05/30/24 13.  Chronic respiratory failure: Wears 2L at baseline and CPAP at night.  Continue guaifenesin  600 mg twice daily             - Incentive spirometer 14.  HTN: BP stable.  Long-term goal normotensive  Fair to good control 10/8 Vitals:   06/09/24 2024 06/10/24 0435  BP: (!) 126/56 (!) 135/48  Pulse: 68 74  Resp: 18 18  Temp: 97.8 F (36.6 C) 97.6 F (36.4 C)  SpO2: 96% 100%    15.  Obesity: BMI 34.27, educate on diet and weight loss to promote overall health and mobility.  16. Intertrigo. Nystatin cream 17.  OSA. on CPAP at home  18.  Normochromic , normocytic anemia , abnormal x ~1 yr , ? CA related , no recent hemocults, will order since pt is now on Eliquis - still pnd  26. 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

## 2024-06-10 NOTE — Progress Notes (Signed)
 Occupational Therapy Session Note  Patient Details  Name: Brenda [name] MRN: [id number] Date of Birth: November 11, 1960  Today's Date: 06/10/2024 OT Individual Time: 9169-9084 OT Individual Time Calculation (min): 45 min    Short Term Goals: Week 1:  OT Short Term Goal 1 (Week 1): STG = LTG at Supervision 2/2 ELOS  Skilled Therapeutic Interventions/Progress Updates:     Pt received in w/c and agreeable to therapy.  Pt had realized that she had put her pants on inside out.  So she worked on  doffing and donning the pants in the correct direction with S. All sit to stands, ambulation in room with supervision.  Pt clarified which direction her home tub faces so pt worked on tub bench transfers in tub room with supervision only.  Reviewed shower curtain placement with tub to avoid water spillage.   Pt stated that as of now and during the OT session she did not have any diplopia.  Pt stated she did have it yesterday afternoon with PT but she was tired.  She does agree her vision changes with fatigue.  Emphasized the need to recognize those signs for energy conservation and to close her eyes and take breaks.    Pt opted to sit in recliner.  All needs met.     Therapy Documentation Precautions:  Precautions Precautions: Fall Restrictions Weight Bearing Restrictions Per Provider Order: No   Pain: Pain Assessment Pain Scale: 0-10 Pain Score: 3  Pain Location: Back Pain Intervention(s): Medication (See eMAR) ADL: ADL Eating: Set up Grooming: Supervision/safety Where Assessed-Grooming: Standing at sink Upper Body Bathing: Supervision/safety Where Assessed-Upper Body Bathing: Shower Lower Body Bathing: Supervision/safety Where Assessed-Lower Body Bathing: Shower (seated on Tub bench) Upper Body Dressing: Supervision/safety Lower Body Dressing: Supervision/safety Toileting: Supervision/safety Where Assessed-Toileting: Teacher, adult education: Close supervision Statistician  Method: Event organiser: Close supervision Film/video editor Method: Designer, industrial/product: Emergency planning/management officer, Grab bars   Therapy/Group: Individual Therapy  Nikolai Wilczak 06/10/2024, 9:10 AM

## 2024-06-10 NOTE — Group Note (Signed)
 Patient Details Name: Brenda [name] MRN: [id number] DOB: [birth year] Today's Date: 06/10/2024  Time Calculation:   PT Group Time Calculation PT Group Start Time: 1330 PT Group Stop Time: 1430 PT Group Time Calculation (min): 60 min    Individual level documentation: Pt performed seated and standing exercises including the following with cues for technique and exhale on exertion:   Group Description: LE Group: Pt participated in LE therapeutic exercise group to address functional strengthening, endurance, and ROM to increase overall functional independence with mobility in a social setting.   Pt performed seated and standing exercises including the following with cues for technique and exhale on exertion:   Participated in the following therex all performed to fatigue for a minimum of 1 min.  Ankle pumps/circles (C/CW) Seated marches Hip abd/add Hip ER/IR with green theraband (PTA assisting in setting up band) Towel placed under feet performing heel slides (ice skaters)  Quad/Hamstring co-contraction (bug squashes) Kicking bounce ball  Sit to stand x 5 Standing ball taps x 2 min   Cool down with hamstring stretch 30 sec x 2 bilaterally (if able)  At end of session pt transported back to room. Remained in w/c with call bell within reach and needs met.       Pain: Pain Assessment Pain Scale: 0-10 Pain Score: 3  Pain Location: Hip Pain Intervention(s): Medication (See eMAR)  Precautions:     Zion Lint 06/10/2024, 4:13 PM

## 2024-06-10 NOTE — Progress Notes (Signed)
 Physical Therapy Session Note  Patient Details  Name: Brenda [name] MRN: [id number] Date of Birth: [birth year]  Today's Date: 06/10/2024 PT Individual Time: 1102-1158; 1500 - 1525 PT Individual Time Calculation (min): 56 min; 25 min   Short Term Goals: Week 1:  PT Short Term Goal 1 (Week 1): = LTGs due to ELOS  SESSION 1 Skilled Therapeutic Interventions/Progress Updates: Patient sitting in recliner on entrance to room. Patient alert and agreeable to PT session.   Patient reported no pain during session. Pt ambulated from room<main gym with no AD and towing O2 tank with PTA following with WC for safety. Pt with supervision and 2 instances of mild unbalance when turning but able to maintain standing balance without intervention. Pt ambulated 150'+ after rest break without WC follow and with supervision (pt towing O2 tank) in order to improve tolerance to household/community distances. Pt with one moment of mild unsteadiness but did able to maintain standing. Pt with seated rest break. Pt ambulated another round same distance with PTA holding tank for safety as pt was cued to look in various directions that PTA called out. Pt required CGA throughout with one moment of minA to prevent LOB (pt reported being distracted when turning when other therapist and pt started to talk). Pt performed all sit<>stands with no AD with supervision for safety.   Therapeutic Exercise: Pt performed the following exercises with therapist providing the described cuing and facilitation for improvement. - Seated B clamshell with green theraband 2 x 15 - Seated B hip flexion with green theraband 2 x 15  Pt cued to recall how to use bow tie method to tie band to avoid difficulty in doffing off of knees (hinted cue)  - sit<>stand with tidal tank in B UE's from WC level 3 x close to fatigue. VC for pt to control descent and to keep water level balanced in tank.  Patient sitting in recliner at end of session with  brakes locked, and all needs within reach.  SESSION 2 Skilled Therapeutic Interventions/Progress Updates: Patient supine in bed with husband present on entrance to room. Patient alert and agreeable to PT session.   Patient reported no pain. PTA discussed with pt and pt husband and pt's CLOF and pt's diplopia and decreased attention to L  Therapeutic Activity: Bed Mobility: Pt performed supine<>sit on EOB with supervision for safety. Transfers: Pt performed sit<>stand transfers throughout session with supervision and no AD  Pt ambulated from room<>ortho gym while towing O2 tank with close supervision and brief moment of mild imbalance but able to take stepping strategy to remain standing.   Therapeutic Exercise: Pt performed the following exercises with therapist providing the described cuing and facilitation for improvement. - NuStep - 0.4 miles; 11 min; 775 steps; 71 avg spm; 1.8 METs; level 1 for first 4 minutes, then 2 for rest of time. Pt did not require a rest break and performed to increase cardiovascular endurance.   Patient supine in bed at end of session with brakes locked, husband present, and all needs within reach.       Therapy Documentation Precautions:  Precautions Precautions: Fall Restrictions Weight Bearing Restrictions Per Provider Order: No  Therapy/Group: Individual Therapy  Bekah Igoe PTA 06/10/2024, 12:03 PM

## 2024-06-10 NOTE — Progress Notes (Signed)
 Patient ID: Brenda [name], female   DOB: 11-07-1960, 63 y.o.   MRN: [id number]  Met with pt to give team conference with goals of supervision-mod/I level and target discharge is 10/10. She is happy she is doing well and feels will be ready to go home Friday. Discussed tub bench and follow up therapies. She has no preference and is in agreement with this plan. Will work on discharge needs. Have placed order for equipment with [employer] to deliver to room prior to discharge.

## 2024-06-10 NOTE — Plan of Care (Signed)
  Problem: Consults Goal: RH STROKE PATIENT EDUCATION Description: See Patient Education module for education specifics  Outcome: Progressing   Problem: RH BOWEL ELIMINATION Goal: RH STG MANAGE BOWEL WITH ASSISTANCE Description: STG Manage Bowel with supervision Assistance. Outcome: Progressing   Problem: RH BLADDER ELIMINATION Goal: RH STG MANAGE BLADDER WITH ASSISTANCE Description: STG Manage Bladder With supervision Assistance Outcome: Progressing   Problem: RH SKIN INTEGRITY Goal: RH STG SKIN FREE OF INFECTION/BREAKDOWN Description: Manage skin free of infection with supervision assistance Outcome: Progressing   Problem: RH SAFETY Goal: RH STG ADHERE TO SAFETY PRECAUTIONS W/ASSISTANCE/DEVICE Description: STG Adhere to Safety Precautions With supervision Assistance/Device. Outcome: Progressing   Problem: RH PAIN MANAGEMENT Goal: RH STG PAIN MANAGED AT OR BELOW [name] PAIN GOAL Description: <4 w/ prns Outcome: Progressing   Problem: RH KNOWLEDGE DEFICIT Goal: RH STG INCREASE KNOWLEDGE OF DIABETES Description: Manage increase knowledge of diabetes with supervision assistance from spouse using educational materials provided Outcome: Progressing Goal: RH STG INCREASE KNOWLEDGE OF HYPERTENSION Description: Manage increase knowledge of hypertension with supervision assistance from spouse using educational materials provided Outcome: Progressing Goal: RH STG INCREASE KNOWLEGDE OF HYPERLIPIDEMIA Description: Manage increase knowledge of hyperlipidemia with supervision assistance from spouse using educational materials provided Outcome: Progressing Goal: RH STG INCREASE KNOWLEDGE OF STROKE PROPHYLAXIS Description: Manage increase knowledge of stroke prophylaxis with supervision assistance from spouse using educational materials provided Outcome: Progressing

## 2024-06-11 ENCOUNTER — Other Ambulatory Visit (HOSPITAL_COMMUNITY): Payer: Self-pay

## 2024-06-11 ENCOUNTER — Encounter: Payer: Self-pay | Admitting: Hematology and Oncology

## 2024-06-11 LAB — CBC WITH DIFFERENTIAL/PLATELET
Abs Immature Granulocytes: 0.02 K/uL (ref 0.00–0.07)
Basophils Absolute: 0.1 K/uL (ref 0.0–0.1)
Basophils Relative: 1 %
Eosinophils Absolute: 0.1 K/uL (ref 0.0–0.5)
Eosinophils Relative: 2 %
HCT: 29.3 % — ABNORMAL LOW (ref 36.0–46.0)
Hemoglobin: 9.3 g/dL — ABNORMAL LOW (ref 12.0–15.0)
Immature Granulocytes: 0 %
Lymphocytes Relative: 19 %
Lymphs Abs: 1.2 K/uL (ref 0.7–4.0)
MCH: 27.7 pg (ref 26.0–34.0)
MCHC: 31.7 g/dL (ref 30.0–36.0)
MCV: 87.2 fL (ref 80.0–100.0)
Monocytes Absolute: 0.5 K/uL (ref 0.1–1.0)
Monocytes Relative: 8 %
Neutro Abs: 4.5 K/uL (ref 1.7–7.7)
Neutrophils Relative %: 70 %
Platelets: 243 K/uL (ref 150–400)
RBC: 3.36 MIL/uL — ABNORMAL LOW (ref 3.87–5.11)
RDW: 16.6 % — ABNORMAL HIGH (ref 11.5–15.5)
WBC: 6.3 K/uL (ref 4.0–10.5)
nRBC: 0 % (ref 0.0–0.2)

## 2024-06-11 LAB — GLUCOSE, CAPILLARY
Glucose-Capillary: 143 mg/dL — ABNORMAL HIGH (ref 70–99)
Glucose-Capillary: 165 mg/dL — ABNORMAL HIGH (ref 70–99)
Glucose-Capillary: 322 mg/dL — ABNORMAL HIGH (ref 70–99)
Glucose-Capillary: 81 mg/dL (ref 70–99)
Glucose-Capillary: 200 mg/dL — ABNORMAL HIGH (ref 70–99)

## 2024-06-11 LAB — BASIC METABOLIC PANEL WITH GFR
Anion gap: 14 (ref 5–15)
BUN: 9 mg/dL (ref 8–23)
CO2: 26 mmol/L (ref 22–32)
Calcium: 9 mg/dL (ref 8.9–10.3)
Chloride: 95 mmol/L — ABNORMAL LOW (ref 98–111)
Creatinine, Ser: 0.66 mg/dL (ref 0.44–1.00)
GFR, Estimated: >60 mL/min (ref >60)
Glucose, Bld: 163 mg/dL — ABNORMAL HIGH (ref 70–99)
Potassium: 4.3 mmol/L (ref 3.5–5.1)
Sodium: 135 mmol/L (ref 135–145)

## 2024-06-11 MED ORDER — ATORVASTATIN CALCIUM 80 MG PO TABS
80.0000 mg | ORAL_TABLET | Freq: Every evening | ORAL | 0 refills | Status: AC
Start: 2024-06-11 — End: ?
  Filled 2024-06-11: qty 30, 30d supply, fill #0

## 2024-06-11 MED ORDER — METFORMIN HCL ER 500 MG PO TB24
1000.0000 mg | ORAL_TABLET | Freq: Two times a day (BID) | ORAL | 0 refills | Status: AC
  Filled 2024-06-11: qty 60, 15d supply, fill #0

## 2024-06-11 MED ORDER — PREGABALIN 50 MG PO CAPS
50.0000 mg | ORAL_CAPSULE | Freq: Three times a day (TID) | ORAL | 0 refills | Status: AC
  Filled 2024-06-11: qty 90, 30d supply, fill #0

## 2024-06-11 MED ORDER — NYSTATIN 100000 UNIT/GM EX CREA
TOPICAL_CREAM | Freq: Two times a day (BID) | CUTANEOUS | 0 refills | Status: AC
  Filled 2024-06-11: qty 30, 15d supply, fill #0

## 2024-06-11 MED ORDER — FLUOXETINE HCL 40 MG PO CAPS
40.0000 mg | ORAL_CAPSULE | Freq: Every day | ORAL | 0 refills | Status: AC
  Filled 2024-06-11: qty 30, 30d supply, fill #0

## 2024-06-11 MED ORDER — APIXABAN 5 MG PO TABS
5.0000 mg | ORAL_TABLET | Freq: Two times a day (BID) | ORAL | 0 refills | Status: DC
  Filled 2024-06-11: qty 60, 30d supply, fill #0

## 2024-06-11 MED ORDER — INSULIN GLARGINE 100 UNIT/ML ~~LOC~~ SOLN
15.0000 [IU] | Freq: Every day | SUBCUTANEOUS | 11 refills | Status: DC
  Filled 2024-06-11: qty 10, 28d supply, fill #0

## 2024-06-11 MED ORDER — ONDANSETRON HCL 8 MG PO TABS
8.0000 mg | ORAL_TABLET | Freq: Three times a day (TID) | ORAL | 0 refills | Status: DC | PRN
  Filled 2024-06-11: qty 30, 10d supply, fill #0

## 2024-06-11 MED ORDER — POLYETHYLENE GLYCOL 3350 17 GM/SCOOP PO POWD
17.0000 g | Freq: Every day | ORAL | 0 refills | Status: AC | PRN
  Filled 2024-06-11: qty 238, 14d supply, fill #0

## 2024-06-11 NOTE — Progress Notes (Signed)
 Physical Therapy Discharge Summary  Patient Details  Name: Brenda [name] MRN: [id number] Date of Birth: February 19, 1961  Date of Discharge from PT service:June 11, 2024  Patient has met 7 of 7 long term goals due to {due un:6958322}.  Patient to discharge at [facility] level {LOA:3049010}.   Patient's care partner {care partner:3041650} to provide the necessary {assistance:3041652} assistance at discharge.  Reasons goals not met: ***  Recommendation:  Patient will benefit from ongoing skilled PT services in {setting:3041680} to continue to advance safe functional mobility, address ongoing impairments in ***, and minimize fall risk.  Equipment: {equipment:3041657}  Reasons for discharge: {Reason for discharge:3049018}  Patient/family agrees with progress made and goals achieved: {Pt/Family agree with progress/goals:3049020}  PT Discharge Precautions/Restrictions Precautions Precautions: Fall Restrictions Weight Bearing Restrictions Per Provider Order: No Pain Pain Assessment Pain Scale: 0-10 Pain Score: 4  Pain Type: Chronic pain Pain Location: Leg Pain Orientation: Left Pain Intervention(s): Medication (See eMAR);RN made aware;Emotional support;Repositioned Pain Interference Pain Interference Pain Effect on Sleep: 2. Occasionally Pain Interference with Therapy Activities: 2. Occasionally Pain Interference with Day-to-Day Activities: 2. Occasionally Vision/Perception  Vision - History Ability to See in Adequate Light: 1 Impaired  Cognition Overall Cognitive Status: Within Functional Limits for tasks assessed Arousal/Alertness: Awake/alert Orientation Level: Oriented X4 Attention: Sustained Sustained Attention: Appears intact Memory: Impaired Memory Impairment: Retrieval deficit;Decreased short term memory Problem Solving: Impaired Problem Solving Impairment: Verbal basic;Functional basic;Verbal complex Safety/Judgment: Appears  intact Sensation Sensation Light Touch: Impaired Detail Light Touch Impaired Details: Impaired LLE Proprioception Impaired Details: Impaired LLE Coordination Gross Motor Movements are Fluid and Coordinated: Yes Fine Motor Movements are Fluid and Coordinated: Yes Motor  Motor Motor: Within Functional Limits Motor - Discharge Observations: mild LLE weakness,, general debility (has improved since evaluation)  Mobility Bed Mobility Bed Mobility: Rolling Right;Supine to Sit;Sit to Supine Rolling Right: Independent Supine to Sit: Independent Sit to Supine: Independent Transfers Transfers: Sit to Stand;Stand Pivot Transfers;Stand to Sit Sit to Stand: Supervision/Verbal cueing Stand to Sit: Supervision/Verbal cueing Stand Pivot Transfers: Supervision/Verbal cueing Transfer (Assistive device): None Locomotion  Gait Ambulation: Yes Gait Assistance: Supervision/Verbal cueing Gait Distance (Feet): 187 Feet Assistive device: None Gait Assistance Details: Verbal cues for precautions/safety (Cue to increase L sided attention) Gait Gait: Yes Gait Pattern: Within Functional Limits Stairs / Additional Locomotion Stairs: Yes Stairs Assistance: Contact Guard/Touching assist Stair Management Technique: Two rails;Alternating pattern Number of Stairs: 8 Height of Stairs: 6 Wheelchair Mobility Wheelchair Mobility: No  Trunk/Postural Assessment  Cervical Assessment Cervical Assessment: Within Functional Limits Thoracic Assessment Thoracic Assessment: Within Functional Limits Lumbar Assessment Lumbar Assessment: Within Functional Limits Postural Control Postural Control: Within Functional Limits  Balance Static Sitting Balance Static Sitting - Level of Assistance: 7: Independent Dynamic Sitting Balance Dynamic Sitting - Level of Assistance: 7: Independent Static Standing Balance Static Standing - Level of Assistance: 7: Independent Dynamic Standing Balance Dynamic Standing - Level  of Assistance: 5: Stand by assistance Extremity Assessment  RLE Assessment RLE Assessment: Within Functional Limits LLE Assessment LLE Assessment: Within Functional Limits   [name] A Saleah Rishel 06/11/2024, 3:49 PM

## 2024-06-11 NOTE — Patient Care Conference (Signed)
 Inpatient RehabilitationTeam Conference and Plan of Care Update Date: 06/10/2024   Time: 10:28 AM    Patient Name: Brenda [name]      Medical Record Number: [id number]  Date of Birth: Apr 14, 1961 Sex: Female         Room/Bed: 4M07C/4M07C-01 Payor Info: Payor: [place] MEDICAID PREPAID HEALTH PLAN / Plan: [place] MEDICAID HEALTHY BLUE / Product Type: *No Product type* /    Admit Date/Time:  06/03/2024  4:46 PM  Primary Diagnosis:  CVA (cerebral vascular accident) [facility])  Hospital Problems: Principal Problem:   CVA (cerebral vascular accident) (HCC) Active Problems:   Adjustment disorder with anxiety    Expected Discharge Date: Expected Discharge Date: 06/12/24  Team Members Present: Social Worker Present: Rhoda Clement, LCSW Nurse Present: Barnie Ronde, RN PT Present: Delinda Bertrand, PTA OT Present: Recardo Maxwell, OT SLP Present: Recardo Mole, SLP     Current Status/Progress Goal Weekly Team Focus  Bowel/Bladder   pt continent of b/b   remain continent   Assist with toileting qshift and prn    Swallow/Nutrition/ Hydration   reg/thin           ADL's   supervision with fair activity tolerance and min cues with managing O2 tubing   supervision goals, may need to upgrade based on LOS   pt/fam education, ADL training, energy conservation training, activity tolerance    Mobility   Supervision overall (light CGA/close supervision with ambulation) no AD   supervision  endurance, balance, therex, education, NMRE    Communication                Safety/Cognition/ Behavioral Observations  moderate cognitive deficits: STM, processing, awareness, L visual attention   minA   pt/family edu, cognitive retraining    Pain   pt c/o chronic pain, scheduled meds given (see mar)  <3   Assess qhift and prn    Skin   Skin intact   maintain skin integrity  Assess qshift and prn      Discharge Planning:  Home with husband and children who can between them provide 24/7 care.  Pt is making good progress this week. Awaiting DME and follow up and will arrange. 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 Chemo treatment on hold. Small DVT on Eliquis.  Patient on target to meet rehab goals: yes, currently needs supervision for ADLs and mobility. Goals for cognition set for min assist.  *See Care Plan and progress notes for long and short-term goals.   Revisions to Treatment Plan:  N/a   Teaching Needs: Safety, medications, transfers, toileting, etc.   Current Barriers to Discharge: Decreased caregiver support and Home enviroment access/layout  Possible Resolutions to Barriers: Family education Ramped entry for home [facility] follow up services DME: TTB     Medical Summary Current Status: stage 4 uterine Ca, left paraspinal, L2-3 vertebral meds, CVA with aphasia  Barriers to Discharge: Morbid Obesity;Medical stability;Pending chemo/radiation   Possible Resolutions to [employer]: coordinate with Onc for f/u, cancelled appt due to hospital stay   Continued Need for Acute Rehabilitation Level of Care: The patient requires daily medical management by a physician with specialized training in physical medicine and rehabilitation for the following  reasons: Direction of a multidisciplinary physical rehabilitation program to maximize functional independence : Yes Medical management of patient stability for increased activity during participation in an intensive rehabilitation regime.: Yes Analysis of laboratory values and/or radiology reports with any subsequent need for medication adjustment and/or medical  intervention. : Yes   I attest that I was present, lead the team conference, and concur with the assessment and plan of the team.   Fredericka Sober B 06/11/2024, 10:36 AM

## 2024-06-11 NOTE — Progress Notes (Signed)
 Occupational Therapy Session Note  Patient Details  Name: Brenda [name] MRN: [id number] Date of Birth: 03-18-1961  Today's Date: 06/11/2024 OT Individual Time: 8692-8597 OT Individual Time Calculation (min): 55 min    Short Term Goals: Week 1:  OT Short Term Goal 1 (Week 1): STG = LTG at Supervision 2/2 ELOS  Skilled Therapeutic Interventions/Progress Updates:  Pt greeted supine in bed, pt agreeable to OT intervention.    Pt on 2L [place] with SpO2 WFL.   Transfers/bed mobility/functional mobility:  Pt completed bed mobility MODI. Pt completed all sit>stands with no AD with supervision. Pt completed functional ambulation with no AD and CGA, pt continues to present with L inattention needing intermittent MIN verbal cues to attend to obstacles on L side.   Therapeutic activity:  Pt completed functional ambulation obstacle course with pt having to step up and over airex pad and around cones with a focus on scanning to L side. Pt completed task with MIN A with no AD. Pt did have LOB when stepping off of airex needing MODA to recover. Pt needed max cues to remember to step up with RLE and step down with LLE.     Education:  Issued pt handouts on L inattention and compensatory strategies to manage deficits. Additionally issued pt HEP on pencil pushups to improve ocular mobility/ROM to counteract occasional instances of diplopia. Pt receptive to all education and verbalizes understanding.     Exercises: pt completed below BUE therex for global endurane with 3lb weighted ball:  X10 chest presses X10 wood chops  X10 bicep curls  X10 rainbow curls X10 OH presses                 Ended session with pt supine in bed with all needs within reach and bed alarm activated.                    Therapy Documentation Precautions:  Precautions Precautions: Fall Recall of Precautions/Restrictions: Intact Restrictions Weight Bearing Restrictions Per Provider Order: No  Pain: Pt  reported general malaise from an upset stomach rest breaks provided as needed.    Therapy/Group: Individual Therapy  Ronal Mallie Needy 06/11/2024, 2:11 PM

## 2024-06-11 NOTE — Progress Notes (Signed)
 Occupational Therapy Discharge Summary  Patient Details  Name: Brenda [name] MRN: [id number] Date of Birth: [birth year]  Date of Discharge from OT service:June 11, 2024   Patient has met 8 of 8 long term goals due to improved activity tolerance, improved balance, ability to compensate for deficits, improved attention, and improved awareness.  Patient to discharge at overall Supervision level.  Patient's care partner is independent to provide the necessary physical and cognitive assistance at discharge.  Spouse and daughter has been present for education.   Reasons goals not met: n/a  Recommendation:  Patient will benefit from ongoing skilled OT services in home health setting to continue to advance functional skills in the area of BADL and iADL.  Equipment: Transfer tub bench  Reasons for discharge: treatment goals met  Patient/family agrees with progress made and goals achieved: Yes  OT Discharge Precautions/Restrictions  Precautions Precautions: Fall Recall of Precautions/Restrictions: Intact Restrictions Weight Bearing Restrictions Per Provider Order: No  ADL ADL Eating: Independent Grooming: Supervision/safety Where Assessed-Grooming: Standing at sink Upper Body Bathing: Supervision/safety Where Assessed-Upper Body Bathing: Shower Lower Body Bathing: Supervision/safety Where Assessed-Lower Body Bathing: Shower Upper Body Dressing: Supervision/safety Lower Body Dressing: Supervision/safety Toileting: Supervision/safety Where Assessed-Toileting: Teacher, adult education: Close supervision Toilet Transfer Method: Insurance claims handler: Close supervison Web designer Method: Ship broker: Insurance underwriter: Close supervision Film/video editor Method: Designer, industrial/product: Emergency planning/management officer, Grab bars Vision Baseline Vision/History: 1 Wears glasses Patient Visual Report: Blurring  of vision (pt states L eye blurry vision is improving) Eye Alignment: Within Functional Limits Ocular Range of Motion: Within Functional Limits Alignment/Gaze Preference: Within Defined Limits Tracking/Visual Pursuits: Able to track stimulus in all quads without difficulty Saccades: Within functional limits Convergence: Impaired (comment) (B eyes able to fully converge toward nose, but pt is seeing double with target about 1 foot from nose, image becomes clear past one foot.  Educated pt on pencil pushups to strengthen eyes.) Visual Fields: Left visual field deficit (left visual field cut is very slight of only 10 -20 degrees) Diplopia Assessment: Present in near gaze;Only with right gaze (B eyes able to fully converge toward nose, but pt is seeing double with target about 1 foot from nose, image becomes clear past one foot. Educated pt on pencil pushups to strengthen eyes.  Diplopia is intermittent but more likely to occur with R gaze.) Perception  Perception: Impaired Perception-Other Comments: pt is no longer having L inattention with self care skills, but continues to have difficult with ambulation and navigating around obstacles. Education on L visual scanning strategies. Praxis Praxis: WFL Cognition Cognition Overall Cognitive Status: Impaired/Different from baseline Memory: Impaired Memory Impairment: Retrieval deficit;Decreased short term memory Problem Solving: Impaired Problem Solving Impairment: Verbal basic;Functional basic;Verbal complex Safety/Judgment: Appears intact Brief Interview for Mental Status (BIMS) Repetition of Three Words (First Attempt): 3 Temporal Orientation: Year: Correct Temporal Orientation: Month: Accurate within 5 days Temporal Orientation: Day: Correct Recall: Sock: Yes, no cue required Recall: Blue: Yes, no cue required Recall: Bed: Yes, no cue required BIMS Summary Score: 15 Sensation Sensation Light Touch Impaired Details: Impaired  LLE Proprioception Impaired Details: Impaired LLE Coordination Gross Motor Movements are Fluid and Coordinated: Yes Fine Motor Movements are Fluid and Coordinated: Yes Motor  Motor Motor - Discharge Observations: mild LLE weakness,, general debility Mobility  Transfers Sit to Stand: Supervision/Verbal cueing Stand to Sit: Supervision/Verbal cueing  Trunk/Postural Assessment  Postural Control Postural Control: Within Functional Limits  Balance Static Sitting Balance  Static Sitting - Level of Assistance: 7: Independent Dynamic Sitting Balance Dynamic Sitting - Level of Assistance: 7: Independent Static Standing Balance Static Standing - Level of Assistance: 7: Independent Dynamic Standing Balance Dynamic Standing - Level of Assistance: 5: Stand by assistance Extremity/Trunk Assessment RUE Assessment General Strength Comments: grossly 4/5 limited by back pain with some UE ROM testing LUE Assessment General Strength Comments: grossly 4/5 limited by back pain with some UE ROM testing   Cebert Dettmann 06/11/2024, 12:03 PM

## 2024-06-11 NOTE — Plan of Care (Signed)
  Problem: Consults Goal: RH STROKE PATIENT EDUCATION Description: See Patient Education module for education specifics  Outcome: Progressing   Problem: RH BOWEL ELIMINATION Goal: RH STG MANAGE BOWEL WITH ASSISTANCE Description: STG Manage Bowel with supervision Assistance. Outcome: Progressing   Problem: RH BLADDER ELIMINATION Goal: RH STG MANAGE BLADDER WITH ASSISTANCE Description: STG Manage Bladder With supervision Assistance Outcome: Progressing   Problem: RH SKIN INTEGRITY Goal: RH STG SKIN FREE OF INFECTION/BREAKDOWN Description: Manage skin free of infection with supervision assistance Outcome: Progressing   Problem: RH SAFETY Goal: RH STG ADHERE TO SAFETY PRECAUTIONS W/ASSISTANCE/DEVICE Description: STG Adhere to Safety Precautions With supervision Assistance/Device. Outcome: Progressing   Problem: RH PAIN MANAGEMENT Goal: RH STG PAIN MANAGED AT OR BELOW [name] PAIN GOAL Description: <4 w/ prns Outcome: Progressing   Problem: RH KNOWLEDGE DEFICIT Goal: RH STG INCREASE KNOWLEDGE OF DIABETES Description: Manage increase knowledge of diabetes with supervision assistance from spouse using educational materials provided Outcome: Progressing Goal: RH STG INCREASE KNOWLEDGE OF HYPERTENSION Description: Manage increase knowledge of hypertension with supervision assistance from spouse using educational materials provided Outcome: Progressing Goal: RH STG INCREASE KNOWLEGDE OF HYPERLIPIDEMIA Description: Manage increase knowledge of hyperlipidemia with supervision assistance from spouse using educational materials provided Outcome: Progressing Goal: RH STG INCREASE KNOWLEDGE OF STROKE PROPHYLAXIS Description: Manage increase knowledge of stroke prophylaxis with supervision assistance from spouse using educational materials provided Outcome: Progressing

## 2024-06-11 NOTE — Progress Notes (Signed)
 Inpatient Rehabilitation Care Coordinator Discharge Note   Patient Details  Name: Brenda [name] MRN: [id number] Date of Birth: June 20, 1961   Discharge location: HOME WITH HUSBAND AND FAMILY WHO CAN BE THERE WITH HER  Length of Stay: 9 DAYS  Discharge activity level: MOD/I-SUPERVISION LEVEL  Home/community participation: ACTIVE  Patient response un:Yzjouy Literacy - How often do you need to have someone help you when you read instructions, pamphlets, or other written material from your doctor or pharmacy?: Never  Patient response un:Dnrpjo Isolation - How often do you feel lonely or isolated from those around you?: Never  Services provided included: MD, RD, PT, OT, SLP, RN, CM, TR, Pharmacy, Neuropsych, SW  Financial Services:  Field seismologist Utilized: Private Insurance HEALTHY BLUE MEDICAID  Choices offered to/list presented to: PT  Follow-up services arranged:  Home Health, DME, Patient/Family has no preference for HH/DME agencies Home Health Agency: [facility]  PT  OT  SP    DME : [facility]  TUB [facility]    Patient response to transportation need: Is the patient able to respond to transportation needs?: Yes In the past 12 months, has lack of transportation kept you from medical appointments or from getting medications?: No In the past 12 months, has lack of transportation kept you from meetings, work, or from getting things needed for daily living?: No   Patient/Family verbalized understanding of follow-up arrangements:  Yes  Individual responsible for coordination of the follow-up plan: SELF AND MATTHEW-HUSBAND [phone]  Confirmed correct DME delivered: Raymonde Asberry MATSU 06/11/2024    Comments (or additional information):PT DID WELL AND MADE GOOD PROGRESS WHILE HERE. SHE WANTS TO GET HOME SO HER CHEMO CAN BEGIN. FEELS READY TO GO HOME  Summary of Stay    Date/Time Discharge Planning CSW  06/10/24 0840 Home with husband and children who can  between them provide 24/7 care. Pt is making good progress this week. Awaiting DME and follow up and will arrange. Being seen by neuro-psych RGD       [employer] Kuehnle G

## 2024-06-11 NOTE — Progress Notes (Signed)
 Physical Therapy Session Note  Patient Details  Name: Brenda [name] MRN: [id number] Date of Birth: 10-24-1960  Today's Date: 06/11/2024 PT Individual Time: [phone] PT Individual Time Calculation (min): 39 min   Short Term Goals: Week 1:  PT Short Term Goal 1 (Week 1): = LTGs due to ELOS  Skilled Therapeutic Interventions/Progress Updates: Patient supine in bed on entrance to room. Patient alert and agreeable to PT session.   Patient reported 6/10 pain (rest breaks provided as needed). Session focused on pt's grad day. See D/C for summary. HEP provided with pt verbalizing understanding of exercises. Pt transported from room<>main gym in [facility] for time management. Pt performed bed mobility independently, and all transfers with supervision and no AD and VC required. Pt reported feeling confident in safe d/c home and appreciative of rehabilitation team.   Access Code: [email or web address] URL: [email or web address] Date: 06/11/2024 Prepared by: Delinda Bertrand  Exercises - Seated Hip Abduction with Resistance  - 1 x daily - 7 x weekly - 3 sets - 10 reps - Seated March with Resistance  - 1 x daily - 7 x weekly - 3 sets - 10 reps - Sit to Stand with Arms Crossed  - 1 x daily - 7 x weekly - 3 sets - 10 reps - Seated Long Arc Quad with Ankle Weight  - 1 x daily - 7 x weekly - 3 sets - 10 reps  Patient demonstrates increased fall risk as noted by score of 39/56 on Berg Balance Scale.  (<36= high risk for falls, close to 100%; 37-45 significant >80%; 46-51 moderate >50%; 52-55 lower >25%)  Patient sitting in recliner at end of session with brakes locked, and all needs within reach.      Therapy Documentation Precautions:  Precautions Precautions: Fall Recall of Precautions/Restrictions: Intact Restrictions Weight Bearing Restrictions Per Provider Order: No   Therapy/Group: Individual Therapy  Xenia Nile PTA 06/11/2024, 4:27 PM

## 2024-06-11 NOTE — Progress Notes (Signed)
 Occupational Therapy Session Note  Patient Details  Name: Brenda [name] MRN: [id number] Date of Birth: [birth year]  Today's Date: 06/11/2024 OT Individual Time: 9079-9054 OT Individual Time Calculation (min): 25 min    Short Term Goals: Week 1:  OT Short Term Goal 1 (Week 1): STG = LTG at Supervision 2/2 ELOS  Skilled Therapeutic Interventions/Progress Updates:     Pt received in bed stating she did not sleep well last night due to increased back pain but also having difficulty with nausea due to pain. She also had a drop in blood sugar and snacked last night which also contributed to belly pain.  Due to a short session now and longer therapy sessions this afternoon, used this session for education.  Reassessed vision.  Education with pt on L visual scanning strategies and pencil push ups for convergence exercises for diplopia.  Provided hand outs for pt to take home and share with her family.   Due to intermittent blurry vision with reading, recommended bright reading lamp or shining flash light on reading material.    Reviewed her progress with her self care and mobility.   Reviewed set up of tub bench as it has been delivered to her room.   Pt resting in bed with all needs met.  Therapy Documentation Precautions:  Precautions Precautions: Fall Restrictions Weight Bearing Restrictions Per Provider Order: No Pain: Pain Assessment Pain Scale: 0-10 Pain Score: 8  Pain Location: Generalized Pain Intervention(s): Repositioned;Medication (See eMAR) ADL: ADL Eating: Set up Grooming: Supervision/safety Where Assessed-Grooming: Standing at sink Upper Body Bathing: Supervision/safety Where Assessed-Upper Body Bathing: Shower Lower Body Bathing: Supervision/safety Where Assessed-Lower Body Bathing: Shower (seated on Tub bench) Upper Body Dressing: Supervision/safety Lower Body Dressing: Supervision/safety Toileting: Supervision/safety Where Assessed-Toileting:  Teacher, adult education: Close supervision Statistician Method: Event organiser: Close supervision Film/video editor Method: Designer, industrial/product: Emergency planning/management officer, Grab bars  Therapy/Group: Individual Therapy  Celestia Duva 06/11/2024, 9:09 AM

## 2024-06-11 NOTE — Progress Notes (Signed)
 Speech Language Pathology Daily Session Note  Patient Details  Name: Brenda [name] MRN: [id number] Date of Birth: December 13, 1960  Today's Date: 06/11/2024 SLP Individual Time: 1000-1127 SLP Individual Time Calculation (min): 87 min  Short Term Goals: Week 1: SLP Short Term Goal 1 (Week 1): STGs = LTGs d/t ELOS  Skilled Therapeutic Interventions:   Pt greeted at bedside for tx targeting cognition. She was awake in bed upon SLP arrival, very agreeable to tx tasks. She appeared more alert and attentive as compared to prev tx session. She was independently oriented in initial conversation and was able to recall recent events re therapy and medical updates w/ supervisionA. SLP facilitated continuation of visual organization task initiated in prev tx session. With modification (remaining options highlighted), she was able to complete task w/ only supervisionA for L visual attention and problem solving. She was also challenged to a written task filling in missing letters to complete the word. She benefited from minA for information processing and only supervisionA for specific word finding. She demonstrated improved visual endurance and organization as compared to prev tx session. Anticipate reduced fatigue and large print assisted w/ this. SLP then provided and demonstrated tx tasks for pt to continue in the home environment. She requested assistance to the bathroom and with dressing. She required set up only d/t tangled O2 lines/call light cords. She demonstrated adequate safety awareness, reasoning, and sequencing throughout. She was continent of bladder. At the end of tx tasks, she was left in bed w/ the alarm set and call light within reach. See d/c summary for more info.   Pain Pain Assessment Pain Scale: 0-10 Pain Score: 6  Pain Location: Generalized Pain Intervention(s): Medication (See eMAR);RN made aware;Emotional support;Repositioned  Therapy/Group: Individual Therapy  Recardo DELENA Mole 06/11/2024, 1:11 PM

## 2024-06-11 NOTE — Progress Notes (Signed)
 PROGRESS NOTE   Subjective/Complaints:  Nauseated but no vomiting this am , woke up with it, no abd pain , some improvement with compazine , eating peanut butter crackers  No swallowing issues   ROS-   Per HPI Pt denies SOB, abd pain, CP, N/V/C/D,    Objective:   No results found.  Recent Labs    06/08/24 1359 06/11/24 0522  WBC 6.9 6.3  HGB 9.8* 9.3*  HCT 31.4* 29.3*  PLT 268 243    Recent Labs    06/08/24 1359 06/11/24 0522  NA 132* 135  K 4.0 4.3  CL 96* 95*  CO2 24 26  GLUCOSE 205* 163*  BUN 9 9  CREATININE 0.76 0.66  CALCIUM  9.0 9.0     Intake/Output Summary (Last 24 hours) at 06/11/2024 0750 Last data filed at 06/10/2024 1758 Gross per 24 hour  Intake 720 ml  Output --  Net 720 ml        Physical Exam: Vital Signs Blood pressure (!) 148/54, pulse 85, temperature 98.5 F (36.9 C), resp. rate 18, height 5' 2 (1.575 m), weight 83.2 kg, last menstrual period 10/30/2014, SpO2 99%.   General: No acute distress Mood and affect are appropriate Heart: Regular rate and rhythm no rubs murmurs or extra sounds Lungs: Clear to auscultation, breathing unlabored, no rales or wheezes Abdomen: Positive bowel sounds, soft nontender to palpation, nondistended Extremities: No clubbing, cyanosis, or edema Skin: No evidence of breakdown, no evidence of rash Neurologic: Cranial nerves II through XII intact, motor strength is 5/5 in bilateral deltoid, bicep, tricep, grip, 5/5 RIgh tand 4/5 left hip flexor, knee extensors, 5/5 bilateral ankle dorsiflexor and plantar flexor  Sensory exam normal sensation to light touch and proprioception in bilateral upper and lower extremities  Musculoskeletal: Full range of motion in all 4 extremities. No joint swelling    Assessment/Plan: 1. Functional deficits which require 3+ hours per day of interdisciplinary therapy in a comprehensive inpatient rehab  setting. Physiatrist is providing close team supervision and 24 hour management of active medical problems listed below. Physiatrist and rehab team continue to assess barriers to discharge/monitor patient progress toward functional and medical goals  Care Tool:  Bathing    Body parts bathed by patient: Right arm, Left arm, Chest, Abdomen, Right upper leg, Left upper leg, Face, Buttocks, Front perineal area, Right lower leg, Left lower leg   Body parts bathed by helper: Right lower leg, Left lower leg     Bathing assist Assist Level: Supervision/Verbal cueing     Upper Body Dressing/Undressing Upper body dressing   What is the patient wearing?: Pull over shirt    Upper body assist Assist Level: Set up assist    Lower Body Dressing/Undressing Lower body dressing      What is the patient wearing?: Pants, Underwear/pull up     Lower body assist Assist for lower body dressing: Supervision/Verbal cueing     Toileting Toileting    Toileting assist Assist for toileting: Supervision/Verbal cueing     Transfers Chair/bed transfer  Transfers assist     Chair/bed transfer assist level: Supervision/Verbal cueing     Locomotion Ambulation   Ambulation assist  Assist level: Supervision/Verbal cueing Assistive device: No Device Max distance: 187   Walk 10 feet activity   Assist     Assist level: Supervision/Verbal cueing Assistive device: No Device   Walk 50 feet activity   Assist    Assist level: Supervision/Verbal cueing Assistive device: No Device    Walk 150 feet activity   Assist Walk 150 feet activity did not occur: Safety/medical concerns (endurance)  Assist level: Supervision/Verbal cueing Assistive device: No Device    Walk 10 feet on uneven surface  activity   Assist     Assist level: Supervision/Verbal cueing Assistive device: Other (comment) (none)   Wheelchair     Assist Is the patient using a wheelchair?: No Type  of Wheelchair: Manual    Wheelchair assist level: Dependent - Patient 0%      Wheelchair 50 feet with 2 turns activity    Assist        Assist Level: Dependent - Patient 0%   Wheelchair 150 feet activity     Assist      Assist Level: Dependent - Patient 0%   Blood pressure (!) 148/54, pulse 85, temperature 98.5 F (36.9 C), resp. rate 18, height 5' 2 (1.575 m), weight 83.2 kg, last menstrual period 10/30/2014, SpO2 99%.  Medical Problem List and Plan: 1. 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  Antithrombotics: -DVT/anticoagulation:  Mechanical: Sequential compression devices, below knee Bilateral lower extremities Pharmaceutical: Eliquis 5 mg BID              -antiplatelet therapy: N/A 3. Pain Management: History of chronic pain syndrome on scheduled Lyrica  50 mg 3 times daily and oxycodone  10 mg as needed.          Monitor for any adverse effects on mentation.  10/5- denies significant pain 4. Mood/Behavior/Sleep: LCSW to follow for evaluation and support when available.              -antipsychotic agents: Prozac --- holding Wellbutrin  XL  5. Neuropsych/cognition: This patient may be capable of making decisions on her own behalf. 6. Skin/Wound Care: Routine pressure relief measures.  7. Fluids/Electrolytes/Nutrition: Monitor intake and output.    Latest Ref Rng & Units 06/11/2024    5:22 AM 06/08/2024    1:59 PM 06/03/2024    5:58 AM  BMP  Glucose 70 - 99 mg/dL 836  794  860   BUN 8 - 23 mg/dL 9  9  8    Creatinine 0.44 - 1.00 mg/dL 9.33  9.23  9.32   Sodium 135 - 145 mmol/L 135  132  136   Potassium 3.5 - 5.1 mmol/L 4.3  4.0   4.0   Chloride 98 - 111 mmol/L 95  96  100   CO2 22 - 32 mmol/L 26  24  26    Calcium  8.9 - 10.3 mg/dL 9.0  9.0  8.6    Normal 10/9 8.  DVT: LE venous Doppler positive for right peroneal vein acute DVT on Effient  PTA now on Eliquis 5 mg  twice daily.  9. T2DM: A1c 9.2 uncontrolled diabetes.  On home metformin 1000 mg BID -Monitor CBG ACHS with SSI.   - Change diet, carb modified.  CBG (last 3)  Recent Labs    06/10/24 2247 06/11/24 0112 06/11/24 0539  GLUCAP 70 143* 165*  Low CBG reduce lantus  to 15U- improved am CBG 10/8 10. HLD: LDL 40, Lipitor  80 mg daily 11.  CAD: NSTEMI s/p stenting cardiology agreeable with Eliquis alone 12.  Uterine CA: Followed by Dr. Lonn. Received 1 cycle of treatment with chemo-Keytruda  Taxol  carboplatin .  Repeat cycle wad due 06/02/24. F/u as OP  Chemo interrupted by hospital admissions 04/20/24,05/04/24,05/21/24, as well as present admit 05/30/24 13.  Chronic respiratory failure: Wears 2L at baseline and CPAP at night.  Continue guaifenesin  600 mg twice daily             - Incentive spirometer 14.  HTN: BP stable.  Long-term goal normotensive  Fair to good control 10/8 Vitals:   06/10/24 1933 06/11/24 0428  BP: (!) 127/45 (!) 148/54  Pulse: 74 85  Resp: 18 18  Temp: 98.1 F (36.7 C) 98.5 F (36.9 C)  SpO2: 100% 99%    15.  Obesity: BMI 34.27, educate on diet and weight loss to promote overall health and mobility.  16. Intertrigo. Nystatin cream 17.  OSA. on CPAP at home  18.  Normochromic , normocytic anemia , abnormal x ~1 yr , ? CA related , no recent hemocults, will order since pt is now on Eliquis - still pnd  52. 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

## 2024-06-11 NOTE — Discharge Summary (Signed)
 Physician Discharge Summary  Patient ID: Brenda [name] MRN: [id number] DOB/AGE: 61-Apr-1962 61 y.o.  Admit date: 06/03/2024 Discharge date: 06/12/2024  Discharge Diagnoses:  Principal Problem:   CVA (cerebral vascular accident) [facility]) Active Problems:   Hyperlipidemia   Essential hypertension   CAD (coronary artery disease)   Obstructive sleep apnea   Morbid obesity (HCC)   Type 2 diabetes mellitus with complication, with long-term current use of insulin  (HCC)   Anxiety with depression   Chronic diastolic CHF (congestive heart failure) (HCC)   (HFpEF) heart failure with preserved ejection fraction (HCC)   Chronic hypoxic respiratory failure (HCC)   Nonspecific chest pain   Anemia   Endometrial carcinoma (HCC)   Acute focal neurological deficit   History of CAD (coronary artery disease)   CAP (community acquired pneumonia)   Metastatic malignant neoplasm (HCC)   DKA (diabetic ketoacidosis) (HCC)   Acute ischemic stroke (HCC)   Brain lesion   Adjustment disorder with anxiety   Discharged Condition: stable  Significant Diagnostic Studies: VAS US  LOWER EXTREMITY VENOUS (DVT) Result Date: 06/02/2024  Lower Venous DVT Study Patient Name:  Brenda [name]  Date of Exam:   06/02/2024 Medical Rec #: [id number]            Accession #:    [id number] Date of Birth: December 20, 1960            Patient Gender: F Patient Age:   63 years Exam Location:  [facility] Procedure:      VAS US  LOWER EXTREMITY VENOUS (DVT) Referring Phys: ARY XU --------------------------------------------------------------------------------  Indications: Stroke.  Risk Factors: Metastatic cancer, chemotherapy. Comparison Study: Previous exam on 02/09/2021 was negative for DVT Performing Technologist: Ezzie Potters RVT, RDMS  Examination Guidelines: A complete evaluation includes B-mode imaging, spectral Doppler, color Doppler, and power Doppler as needed of all accessible portions of each vessel. Bilateral testing  is considered an integral part of a complete examination. Limited examinations for reoccurring indications may be performed as noted. The reflux portion of the exam is performed with the patient in reverse Trendelenburg.  +---------+---------------+---------+-----------+----------+-------------------+ RIGHT    CompressibilityPhasicitySpontaneityPropertiesThrombus Aging      +---------+---------------+---------+-----------+----------+-------------------+ CFV      Full           Yes      Yes                                      +---------+---------------+---------+-----------+----------+-------------------+ SFJ      Full                                                             +---------+---------------+---------+-----------+----------+-------------------+ FV Prox  Full           Yes      Yes                                      +---------+---------------+---------+-----------+----------+-------------------+ FV Mid   Full           Yes      Yes                                      +---------+---------------+---------+-----------+----------+-------------------+  FV DistalFull           Yes      Yes                                      +---------+---------------+---------+-----------+----------+-------------------+ PFV      Full                                                             +---------+---------------+---------+-----------+----------+-------------------+ POP      Full           Yes      Yes                                      +---------+---------------+---------+-----------+----------+-------------------+ PTV      Full                                                             +---------+---------------+---------+-----------+----------+-------------------+ PERO     None           No       No                   Acute one of paired +---------+---------------+---------+-----------+----------+-------------------+    +---------+---------------+---------+-----------+----------+--------------+ LEFT     CompressibilityPhasicitySpontaneityPropertiesThrombus Aging +---------+---------------+---------+-----------+----------+--------------+ CFV      Full           Yes      Yes                                 +---------+---------------+---------+-----------+----------+--------------+ SFJ      Full                                                        +---------+---------------+---------+-----------+----------+--------------+ FV Prox  Full           Yes      Yes                                 +---------+---------------+---------+-----------+----------+--------------+ FV Mid   Full           Yes      Yes                                 +---------+---------------+---------+-----------+----------+--------------+ FV DistalFull           Yes      Yes                                 +---------+---------------+---------+-----------+----------+--------------+ PFV  Full                                                        +---------+---------------+---------+-----------+----------+--------------+ POP      Full           Yes      Yes                                 +---------+---------------+---------+-----------+----------+--------------+ PTV      Full                                                        +---------+---------------+---------+-----------+----------+--------------+ PERO     Full                                                        +---------+---------------+---------+-----------+----------+--------------+     Summary: BILATERAL: -No evidence of popliteal cyst, bilaterally. RIGHT: - Findings consistent with acute deep vein thrombosis involving the right peroneal veins.   LEFT: - There is no evidence of deep vein thrombosis in the lower extremity.  *See table(s) above for measurements and observations. Electronically signed by Debby Robertson on 06/02/2024  at 4:10:55 PM.    Final    ECHOCARDIOGRAM COMPLETE Result Date: 06/01/2024    ECHOCARDIOGRAM REPORT   Patient Name:   Brenda [name] Date of Exam: 06/01/2024 Medical Rec #:  [id number]           Height:       62.0 in Accession #:    [id number]          Weight:       187.4 lb Date of Birth:  08-Jun-1961           BSA:          1.860 m Patient Age:    63 years            BP:           101/63 mmHg Patient Gender: F                   HR:           56 bpm. Exam Location:  Inpatient Procedure: 2D Echo, Cardiac Doppler, Color Doppler and Saline Contrast Bubble            Study (Both Spectral and Color Flow Doppler were utilized during            procedure). Indications:    Stroke  History:        Patient has prior history of Echocardiogram examinations, most                 recent 04/21/2024. CHF, CAD, Signs/Symptoms:Dyspnea; Risk                 Factors:Hypertension, Dyslipidemia, Sleep Apnea and Diabetes.  Sonographer:    Philomena Daring Referring Phys: ERIC J [place] IMPRESSIONS  1. Left  ventricular ejection fraction, by estimation, is 55 to 60%. The left ventricle has normal function. The left ventricle has no regional wall motion abnormalities. Left ventricular diastolic parameters are consistent with Grade II diastolic dysfunction (pseudonormalization).  2. Right ventricular systolic function is low normal. The right ventricular size is normal.  3. The mitral valve is normal in structure. No evidence of mitral valve regurgitation. No evidence of mitral stenosis.  4. The aortic valve is calcified. There is moderate calcification of the aortic valve. There is moderate thickening of the aortic valve. Aortic valve regurgitation is not visualized. Mild aortic valve stenosis. Aortic valve area, by VTI measures 1.47 cm. Aortic valve mean gradient measures 15.0 mmHg. Aortic valve Vmax measures 2.71 m/s.  5. The inferior vena cava is normal in size with greater than 50% respiratory variability, suggesting right atrial  pressure of 3 mmHg. FINDINGS  Left Ventricle: Left ventricular ejection fraction, by estimation, is 55 to 60%. The left ventricle has normal function. The left ventricle has no regional wall motion abnormalities. The left ventricular internal cavity size was normal in size. There is  no left ventricular hypertrophy. Left ventricular diastolic parameters are consistent with Grade II diastolic dysfunction (pseudonormalization). Right Ventricle: The right ventricular size is normal. No increase in right ventricular wall thickness. Right ventricular systolic function is low normal. Left Atrium: Left atrial size was normal in size. Right Atrium: Right atrial size was normal in size. Pericardium: There is no evidence of pericardial effusion. Mitral Valve: The mitral valve is normal in structure. No evidence of mitral valve regurgitation. No evidence of mitral valve stenosis. Tricuspid Valve: The tricuspid valve is normal in structure. Tricuspid valve regurgitation is not demonstrated. No evidence of tricuspid stenosis. Aortic Valve: The aortic valve is calcified. There is moderate calcification of the aortic valve. There is moderate thickening of the aortic valve. Aortic valve regurgitation is not visualized. Mild aortic stenosis is present. Aortic valve mean gradient measures 15.0 mmHg. Aortic valve peak gradient measures 29.4 mmHg. Aortic valve area, by VTI measures 1.47 cm. Pulmonic Valve: The pulmonic valve was normal in structure. Pulmonic valve regurgitation is not visualized. No evidence of pulmonic stenosis. Aorta: The aortic root is normal in size and structure. Venous: The inferior vena cava is normal in size with greater than 50% respiratory variability, suggesting right atrial pressure of 3 mmHg. IAS/Shunts: No atrial level shunt detected by color flow Doppler. Agitated saline contrast was given intravenously to evaluate for intracardiac shunting.  LEFT VENTRICLE PLAX 2D LVIDd:         5.20 cm   Diastology  LVIDs:         3.60 cm   LV e' medial:    6.20 cm/s LV PW:         1.20 cm   LV E/e' medial:  19.5 LV IVS:        1.00 cm   LV e' lateral:   6.42 cm/s LVOT diam:     2.00 cm   LV E/e' lateral: 18.8 LV SV:         90 LV SV Index:   48 LVOT Area:     3.14 cm  RIGHT VENTRICLE            IVC RV S prime:     8.05 cm/s  IVC diam: 1.80 cm TAPSE (M-mode): 1.5 cm LEFT ATRIUM             Index        RIGHT  ATRIUM           Index LA diam:        3.80 cm 2.04 cm/m   RA Area:     12.20 cm LA Vol (A2C):   52.0 ml 27.96 ml/m  RA Volume:   23.10 ml  12.42 ml/m LA Vol (A4C):   50.1 ml 26.94 ml/m LA Biplane Vol: 52.2 ml 28.07 ml/m  AORTIC VALVE AV Area (Vmax):    1.36 cm AV Area (Vmean):   1.36 cm AV Area (VTI):     1.47 cm AV Vmax:           271.00 cm/s AV Vmean:          179.250 cm/s AV VTI:            0.612 m AV Peak Grad:      29.4 mmHg AV Mean Grad:      15.0 mmHg LVOT Vmax:         117.00 cm/s LVOT Vmean:        77.600 cm/s LVOT VTI:          0.286 m LVOT/AV VTI ratio: 0.47  AORTA Ao Root diam: 2.80 cm Ao Asc diam:  3.50 cm MITRAL VALVE MV Area (PHT): 2.14 cm     SHUNTS MV Decel Time: 355 msec     Systemic VTI:  0.29 m MV E velocity: 121.00 cm/s  Systemic Diam: 2.00 cm MV A velocity: 123.00 cm/s MV E/A ratio:  0.98 Morene Brownie Electronically signed by Morene Brownie Signature Date/Time: 06/01/2024/9:38:18 PM    Final    EEG adult Result Date: 06/01/2024 Matthews Elida HERO, MD     06/01/2024  7:53 AM Routine EEG Report Brenda [name] is a 63 y.o. female with a history of altered mental status who is undergoing an EEG to evaluate for seizures. Report: This EEG was acquired with electrodes placed according to the International 10-20 electrode system (including Fp1, Fp2, F3, F4, C3, C4, P3, P4, O1, O2, T3, T4, T5, T6, A1, A2, Fz, Cz, Pz). The following electrodes were missing or displaced: none. The occipital dominant rhythm was 6-7 Hz. This activity is reactive to stimulation. Drowsiness was manifested by  background fragmentation; deeper stages of sleep were not identified. There was no focal slowing. There were no interictal epileptiform discharges. There were no electrographic seizures identified. Photic stimulation and hyperventilation were not performed. Impression and clinical correlation: This EEG was obtained while awake and drowsy and is abnormal due to mild diffuse slowing indicative of global cerebral dysfunction. Epileptiform abnormalities were not seen during this recording. Elida Matthews, MD [facility] [phone] If 7pm- 7am, please page neurology on call as listed in AMION.   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 Confusion, weakness, neurologic deficit. EXAM: MRI HEAD WITHOUT AND WITH CONTRAST TECHNIQUE: Multiplanar, multiecho pulse sequences of the brain and surrounding structures were obtained without and with intravenous contrast. CONTRAST:  8mL GADAVIST  GADOBUTROL  1 MMOL/ML IV SOLN COMPARISON:  Head CT without contrast 0101 hours today. Brain MRI 04/21/2024. FINDINGS: Brain: Confluent restricted diffusion in the right PCA territory from the posterior mesial temporal lobe, patchy right occipital lobe involvement, periatrial white matter involvement and marginal involvement of the splenium of the corpus callosum. Additional numerous small foci of restricted diffusion scattered in the bilateral cerebellum and cerebral hemispheres. Anterior and posterior circulation affected bilaterally. Deep gray nuclei and brainstem are relatively spared. Cytotoxic  edema in the affected areas. No intracranial hemorrhage identified. Luxury perfusion enhancement appearance in the right PCA territory (series 17, image 13). No masslike or suspicious postcontrast enhancement. No dural thickening. No midline shift, ventriculomegaly, evidence of discrete intracranial mass. Cervicomedullary junction and pituitary are within  normal limits. No abnormal enhancement identified. Evidence of mass lesion, ventriculomegaly, extra-axial collection or acute intracranial hemorrhage. Cervicomedullary junction and pituitary are within normal limits. Vascular: Major intracranial vascular flow voids are preserved. Skull and upper cervical spine: Visualized bone marrow signal is within normal limits. Negative visible cervical spine and spinal cord. Sinuses/Orbits: Negative. Other: Stable mild mastoid effusion since last month. IMPRESSION: 1. Numerous small acute embolic infarcts in the bilateral anterior and posterior circulation, with superimposed confluent Right PCA territory infarct corresponding to that seen on CT this morning. 2. Cytotoxic edema with no intracranial hemorrhage or mass effect. 3. No intracranial metastatic disease identified. Electronically Signed   By: VEAR Hurst M.D.   On: 05/31/2024 10:49   CT Head Wo Contrast Result Date: 05/31/2024 CLINICAL DATA:  Confusion, weakness, mental status changes EXAM: CT HEAD WITHOUT CONTRAST TECHNIQUE: Contiguous axial images were obtained from the base of the skull through the vertex without intravenous contrast. RADIATION DOSE REDUCTION: This exam was performed according to the departmental dose-optimization program which includes automated exposure control, adjustment of the mA and/or kV according to patient size and/or use of iterative reconstruction technique. COMPARISON:  04/20/2024 FINDINGS: Brain: Area of low-density noted in the right posterior temporal and occipital lobes adjacent to the right lateral ventricle, new since prior study concerning for acute to subacute infarcts. No hemorrhage or hydrocephalus. No mass effect or midline shift. Vascular: No hyperdense vessel or unexpected calcification. Skull: No acute calvarial abnormality. Sinuses/Orbits: No acute findings Other: None IMPRESSION: New low-density areas in the posterior right temporal lobe and occipital lobe concerning for  acute to subacute infarcts. These are new since prior study. Electronically Signed   By: Franky Crease M.D.   On: 05/31/2024 01:20   DG Chest Port 1 View Result Date: 05/31/2024 CLINICAL DATA:  Altered mental status EXAM: PORTABLE CHEST 1 VIEW COMPARISON:  05/21/2024 FINDINGS: Stable chronic elevation of the right hemidiaphragm. Right Port-A-Cath remains in place, unchanged. Cardiomediastinal contours are unchanged. No confluent airspace opacities, effusions or edema. No acute bony abnormality. IMPRESSION: No active disease. Electronically Signed   By: Franky Crease M.D.   On: 05/31/2024 00:20   DG CHEST PORT 1 VIEW Result Date: 05/21/2024 CLINICAL DATA:  10031 Cough 10031 EXAM: PORTABLE CHEST 1 VIEW COMPARISON:  Chest x-ray 05/04/2024, CT abdomen pelvis 05/04/2024 FINDINGS: Right chest wall accessed Port-A-Cath with tip overlying the expected region of the superior collection. The heart and mediastinal contours are unchanged. Persistent elevation of the right hemidiaphragm. No focal consolidation. No pulmonary edema. No pleural effusion. No pneumothorax. No acute osseous abnormality. IMPRESSION: No active disease. Electronically Signed   By: Morgane  Naveau M.D.   On: 05/21/2024 23:50    Labs:  Basic Metabolic Panel: Recent Labs  Lab 06/08/24 1359 06/11/24 0522  NA 132* 135  K 4.0 4.3  CL 96* 95*  CO2 24 26  GLUCOSE 205* 163*  BUN 9 9  CREATININE 0.76 0.66  CALCIUM  9.0 9.0    CBC: Recent Labs  Lab 06/08/24 1359 06/11/24 0522  WBC 6.9 6.3  NEUTROABS 4.9 4.5  HGB 9.8* 9.3*  HCT 31.4* 29.3*  MCV 87.7 87.2  PLT 268 243    CBG: Recent Labs  Lab 06/11/24  9460 06/11/24 1139 06/11/24 1639 06/11/24 2019 06/12/24 0614  GLUCAP 165* 322* 81 200* 211*    Brief HPI:   Reana Chacko is a 63 y.o. female with a complex medical history, including hypertension, stage IV metastatic uterine cancer, type two diabetes, obstructive sleep apnea on chronic CPAP and oxygen , chronic  respiratory failure, and CAD/NSTEMI status post stenting (10/2019) on Effient  and Lipitor  80 mg. Allergic to Plavix. Patient [facility] on 05/31/24 d/t mild confusion and occasional word finding difficulty, noted to be baseline since chemotherapy.  A CT of the head was concerning for an acute to subacute infarct in the right temporal and occipital lobe. A CTA of the head and neck was previously unremarkable. MRI showed embolic infarcts and patient was transferred [facility] for further workup. Neurology consulted and patient was placed on aspirin , this was later switched to Eliquis as CVA was felt likely due to hypercoagulable state from metastatic cancer.    She is followed by Dr. Mellie for metastatic uterine cancer dx in 2023 (s/p surgery and radiation). Patient with severe back pain 03/2024, CT showed recurrent disease in the retroperitoneal lymph node. CT chest showed lung mass suspicious for metastatic lesion. Pathology confirmed left retroperitoneal mass to be adenocarcinoma.  On 9/28 abnormal EEG due to mild diffuse slowing from global cerebral dysfunction.  Echo EF 55-60% grade 2 dysfunction with mild aortic valve stenosis.  Venous Doppler positive for right peroneal vein acute DVT on 9/30, likely related to hypercoagulable state.  Patient reports that as a result of her cancer she has back pain with radiation down her left leg.  Per chart review the patient lives at home with her spouse in a 1 level house with ramped entry.  She was independent and driving prior to arrival, there is history of some recent falls.  She currently requires CGA for sit to stand ad ambulate 60 feet at min assist to CGA with rollator. Therapy evaluations completed due to patient decreased functional mobility was admitted for a comprehensive rehab program.     Hospital Course: Brenda [name] was admitted to rehab 06/03/2024 for inpatient therapies to consist of PT, ST and OT at least  three hours five days a week. Past admission physiatrist, therapy team and rehab RN have worked together to provide customized collaborative inpatient rehab.  Functional deficits secondary to Bilateral anterior and posterior small infarcts embolic likely secondary to hypercoagulable state from advanced malignancy.  Anticoagulation: Eliqiuis 5 mg twice daily   Pain Management: Oxycodone  IR10 mg   Mood/Behavior/Sleep: continue home regimen Prozac . Wellbutrin  XL not resumed in rehab.     Skin/Wound Care: Routine pressure relief measures. Wound care/signs and symptoms of infection discussed at discharge.    Fluid/Nutrition/Electrolytes: Stable intake and output.     Hypertension: Fairly controled, long term goal normotensive. Continue Losartan .  Patient reports access to BP cuff. Education for BP parameters discussed. The patient is scheduled to establish care/follow up with PCP upon discharge.    Hyperlipidemia: Lipitor  90 mg daily    Diabetes type II: Uncontrolled diabetes Hgb A1C  9.2. Continue metformin 1000 mg BID and Tresiba. Diabetes has been monitored with ac/hs CBG checks and SSI was use prn for tighter BS control. Diabetic teaching was completed.   CAD: Hx of NSTEMI s/p stenting. Cardiology recommends Eliquis.   Uterine Cancer: Chemo interrupted by hospital admission. Follow up with Dr. Lonn. Received 1 cycle of treatment with chemo-Keytruda  Taxol  carboplatin . Repeat cycle  was du 06/02/24.  Chronic Respiratory Failure: Continue 2 L via nasal cannula and CPAP at night. Guaifenesin  600 mg twice daily. Follow up with pulmonology.   Normochromic, normocytic anemia: likley due to uterine cancer. FOBT negative while on Eliquis.   Visuoperceptual Issues: due to right parietal involvement post bilateral CVA with left neglect. Follow up with Neurology outpatient.   Planned Outpatient Follow-Up: PCP, Pulmonology, [facility], PM&R, Cardiologist, Oncology    Rehab course:  During patient's stay in rehab weekly team conferences were held to monitor patient's progress, set goals and discuss barriers to discharge. At admission, patient required CGA for sit to stand ad ambulate 60 feet at min assist to CGA with rollator.    Occupational Therapy: Patient has met all long term goals due to improved activity tolerance, improved balance. She will discharge at overall supervision level, with good understanding and adherence to precautions. Patient's care partner is independent to provide the necessary physical assistance at discharge. She will benefit from ongoing OT in home health setting to continue to advance functional skills in the area of BADL.   Physical Therapy: Patient has met ALL long term goals due to improved activity tolerance, improved balance, improved postural control, increased strength, decreased pain, ability to compensate for deficits, improved attention, improved awareness, and improved coordination.  Patient to discharge at an ambulatory level supervision.   Patient's care partner is independent to provide the necessary physical assistance at discharge.  She will benefit from ongoing skilled PT services in home halth setting to continue to advance safe functional mobility, address ongoing impairments in strength, ROM, balance, endurance, gait, and minimize fall risk.   Speech Therapy: Patient has met all long term goals and will discharge at overall supervision level. She has made excellent progress per chart review. She will benefit from Supervision A overall. ST is recommended in outpatient setting.    Discharge plan was discussed with patient and/or family member and they verbalized understanding and agreed with it.      Disposition:  Discharge disposition: 06-Home-Health Care Svc        Diet: Carb modified   Special Instructions:  -No driving or operating heavy machinery until cleared by provider  -No smoking or alcohol or illicit drug use      Allergies as of 06/12/2024       Reactions   Hydrocodone Shortness Of Breath, Dermatitis, Other (See Comments)   I forget to breathe.   Clopidogrel Rash, Dermatitis        Medication List     PAUSE taking these medications    buPROPion  150 MG 24 hr tablet Wait to take this until your doctor or other care provider tells you to start again. Commonly known as: WELLBUTRIN  XL Take 150 mg by mouth in the morning.   losartan  25 MG tablet Wait to take this until your doctor or other care provider tells you to start again. Commonly known as: COZAAR  Take 1 tablet (25 mg total) by mouth daily.       TAKE these medications    acetaminophen  500 MG tablet Commonly known as: TYLENOL  Take 1-2 tablets (500-1,000 mg total) by mouth every 8 (eight) hours as needed for mild pain (pain score 1-3) or moderate pain (pain score 4-6).   apixaban 5 MG Tabs tablet Commonly known as: ELIQUIS Take 1 tablet (5 mg total) by mouth 2 (two) times daily.   atorvastatin  80 MG tablet Commonly known as: LIPITOR  Take 1 tablet (80 mg total) by mouth  every evening.   Fiasp  FlexTouch 100 UNIT/ML FlexTouch Pen Generic drug: insulin  aspart Inject 5 Units into the skin with breakfast, with lunch, and with evening meal.   FLUoxetine  40 MG capsule Commonly known as: PROZAC  Take 1 capsule (40 mg total) by mouth daily.   guaiFENesin  600 MG 12 hr tablet Commonly known as: MUCINEX  Take 1 tablet (600 mg total) by mouth 2 (two) times daily as needed for cough or to loosen phlegm.   insulin  degludec 100 UNIT/ML FlexTouch Pen Commonly known as: TRESIBA Inject 24 Units into the skin every evening.   lactulose  10 GM/15ML solution Commonly known as: CHRONULAC  Take 45 mLs (30 g total) by mouth 2 (two) times daily as needed for mild constipation or moderate constipation.   lidocaine  5 % Commonly known as: LIDODERM  Place 1 patch onto the skin daily.   metFORMIN 500 MG 24 hr tablet Commonly known as:  GLUCOPHAGE-XR Take 2 tablets (1,000 mg total) by mouth 2 (two) times daily with a meal. What changed: when to take this   Misc. Devices Misc Portable oxygen  concentrator, 2L oxygen .  Diagnoses-chronic respiratory failure with hypoxia   NEURIVA PO Take 1 tablet by mouth daily.   nitroGLYCERIN  0.4 MG SL tablet Commonly known as: NITROSTAT  Dissolve 1 tablet under the tongue every 5 minutes as needed for chest pain. Max of 3 doses, then 911.   nystatin cream Commonly known as: MYCOSTATIN Apply topically 2 (two) times daily.   ondansetron  8 MG tablet Commonly known as: ZOFRAN  Take 1 tablet (8 mg total) by mouth every 8 (eight) hours as needed for nausea or vomiting. Start on the third day after carboplatin .   Oxycodone  HCl 10 MG Tabs Take 1 tablet (10 mg total) by mouth every 6 (six) hours as needed. What changed: reasons to take this   OXYGEN  Inhale 2 L/min into the lungs continuous.   polyethylene glycol powder 17 GM/SCOOP powder Commonly known as: GLYCOLAX /MIRALAX  Dissolve 1 capful (17g) in 4-8 ounces of liquid and take by mouth daily as needed for mild constipation.   pregabalin  50 MG capsule Commonly known as: LYRICA  Take 1 capsule (50 mg total) by mouth 3 (three) times daily.   senna-docusate 8.6-50 MG tablet Commonly known as: Senokot-S Take 1-2 tablets by mouth 2 (two) times daily between meals as needed for mild constipation or moderate constipation. What changed: when to take this   Vitamin D3 Super Strength 50 MCG (2000 UT) Caps Generic drug: Cholecalciferol  Take 2,000 Units by mouth in the morning.        Follow-up Information     Lonn Hicks, MD Follow up.   Specialty: Hematology and Oncology Why: Call for an appointment. Contact information: [address] [place] [address] [phone]         [facility] Follow up.   Specialty: Neurology Why: Call for an appointment. Contact information: [address] [place]   [address] [phone]                Signed: Daphne LITTIE Satterfield 06/12/2024, 10:20 AM

## 2024-06-12 ENCOUNTER — Inpatient Hospital Stay

## 2024-06-12 ENCOUNTER — Inpatient Hospital Stay: Admitting: Hematology and Oncology

## 2024-06-12 ENCOUNTER — Other Ambulatory Visit: Payer: Self-pay | Admitting: Hematology and Oncology

## 2024-06-12 ENCOUNTER — Encounter: Payer: Self-pay | Admitting: Hematology and Oncology

## 2024-06-12 ENCOUNTER — Other Ambulatory Visit (HOSPITAL_COMMUNITY): Payer: Self-pay

## 2024-06-12 LAB — GLUCOSE, CAPILLARY: Glucose-Capillary: 211 mg/dL — ABNORMAL HIGH (ref 70–99)

## 2024-06-12 NOTE — Progress Notes (Addendum)
 Inpatient Rehabilitation Discharge Medication Review by a Pharmacist  A complete drug regimen review was completed for this patient to identify any potential clinically significant medication issues.  High Risk Drug Classes Is patient taking? Indication by Medication  Antipsychotic No   Anticoagulant Yes Eliquis - DVT, stroke  Antibiotic No   Opioid Yes Oxycodone  - pain  Antiplatelet No   Hypoglycemics/insulin  Yes Fiasp , Insulin  degludec, Metformin - DM2  Vasoactive Medication No   Chemotherapy No   Other Yes Nystatin cream - topical fungal infection Atorvastatin  - cholesterol Fluoxetine  - mood Guaifenesin  - cough Lidocaine  patch - topical pain relief Nitroglycerin  SL - chest pain Ondansetron  - nausea Pregabalin  - nerve pain Vitamin D3, Neuriva - supplements PEG , Senokot-S , and - constipation Acetaminophen - pain   Lactulose  - constipation     Type of Medication Issue Identified Description of Issue Recommendation(s)  Drug Interaction(s) (clinically significant)     Duplicate Therapy     Allergy     No Medication Administration End Date     Incorrect Dose     Additional Drug Therapy Needed  NEW Eliquis  HOLD Buproprion and Losartan  until follow up with MD per AVS instructions   Significant med changes from prior encounter (inform family/care partners about these prior to discharge).  Communicate relevant medication changes to patient/family members at discharge from CIR.   Restart or discontinue PTA meds not resumed in CIR at discharge if clinically indicated.   Other       Clinically significant medication issues were identified that warrant physician communication and completion of prescribed/recommended actions by midnight of the next day:  No  Name of provider notified for urgent issues identified:   Provider Method of Notification:     Pharmacist comments:   Time spent performing this drug regimen review (minutes):  20    Rocky Slade, PharmD,  BCPS

## 2024-06-12 NOTE — Progress Notes (Signed)
 Speech Language Pathology Discharge Summary  Patient Details  Name: Brenda [name] MRN: [id number] Date of Birth: [birth year]  Date of Discharge from SLP service:June 11, 2024   Patient has met 3 of 3 long term goals.  Patient to discharge at overall Supervision level.  Reasons goals not met: n/a   Clinical Impression/Discharge Summary:  Excellent progress noted this stay, as evidenced by improved STM, information processing, problem solving, and organization. Mild cognitive deficits remain and success still varies slightly depending on her fatigue level. She benefits from supervisionA overall at this time. Pt/family education complete and she was provided w/ tx tasks to complete at home. Recommend cont ST upon d/c to target remaining cognitive deficits, maximize pt independence, and reduce caregiver burden.    Care Partner:  Caregiver Able to Provide Assistance: Yes  Type of Caregiver Assistance: Cognitive;Physical  Recommendation:  Outpatient SLP  Rationale for SLP Follow Up: Maximize cognitive function and independence;Reduce caregiver burden   Equipment: n/a   Reasons for discharge: Discharged from hospital   Patient/Family Agrees with Progress Made and Goals Achieved: Yes    Recardo DELENA Mole 06/12/2024, 7:57 AM

## 2024-06-12 NOTE — Plan of Care (Signed)
  Problem: RH Balance Goal: LTG Patient will maintain dynamic standing with ADLs (OT) Description: LTG:  Patient will maintain dynamic standing balance with assist during activities of daily living (OT)  Outcome: Completed/Met   Problem: Sit to Stand Goal: LTG:  Patient will perform sit to stand in prep for activites of daily living with assistance level (OT) Description: LTG:  Patient will perform sit to stand in prep for activites of daily living with assistance level (OT) Outcome: Completed/Met   Problem: RH Grooming Goal: LTG Patient will perform grooming w/assist,cues/equip (OT) Description: LTG: Patient will perform grooming with assist, with/without cues using equipment (OT) Outcome: Completed/Met   Problem: RH Bathing Goal: LTG Patient will bathe all body parts with assist levels (OT) Description: LTG: Patient will bathe all body parts with assist levels (OT) Outcome: Completed/Met   Problem: RH Dressing Goal: LTG Patient will perform upper body dressing (OT) Description: LTG Patient will perform upper body dressing with assist, with/without cues (OT). Outcome: Completed/Met Goal: LTG Patient will perform lower body dressing w/assist (OT) Description: LTG: Patient will perform lower body dressing with assist, with/without cues in positioning using equipment (OT) Outcome: Completed/Met   Problem: RH Toileting Goal: LTG Patient will perform toileting task (3/3 steps) with assistance level (OT) Description: LTG: Patient will perform toileting task (3/3 steps) with assistance level (OT)  Outcome: Completed/Met   Problem: RH Toilet Transfers Goal: LTG Patient will perform toilet transfers w/assist (OT) Description: LTG: Patient will perform toilet transfers with assist, with/without cues using equipment (OT) Outcome: Completed/Met   

## 2024-06-12 NOTE — Plan of Care (Signed)
  Problem: RH Problem Solving Goal: LTG Patient will demonstrate problem solving for (SLP) Description: LTG:  Patient will demonstrate problem solving for basic/complex daily situations with cues  (SLP) Outcome: Completed/Met   Problem: RH Memory Goal: LTG Patient will use memory compensatory aids to (SLP) Description: LTG:  Patient will use memory compensatory aids to recall biographical/new, daily complex information with cues (SLP) Outcome: Completed/Met   Problem: RH Pre-functional/Other (Specify) Goal: RH LTG SLP (Specify) 1 Description: RH LTG SLP (Specify) 1 Outcome: Completed/Met

## 2024-06-12 NOTE — Progress Notes (Signed)
 PROGRESS NOTE   Subjective/Complaints:  Taking Oxy IR 10mg  BID on average , had 90 tabs written by Dr Lonn 9/25, pt admitted 9/27 so should have majority of rx left   ROS-   Per HPI Pt denies SOB, abd pain, CP, N/V/C/D,    Objective:   No results found.  Recent Labs    06/11/24 0522  WBC 6.3  HGB 9.3*  HCT 29.3*  PLT 243    Recent Labs    06/11/24 0522  NA 135  K 4.3  CL 95*  CO2 26  GLUCOSE 163*  BUN 9  CREATININE 0.66  CALCIUM  9.0     Intake/Output Summary (Last 24 hours) at 06/12/2024 0808 Last data filed at 06/12/2024 0735 Gross per 24 hour  Intake 250 ml  Output 1 ml  Net 249 ml        Physical Exam: Vital Signs Blood pressure (!) 152/56, pulse 86, temperature 98.3 F (36.8 C), resp. rate 18, height 5' 2 (1.575 m), weight 83.2 kg, last menstrual period 10/30/2014, SpO2 97%.   General: No acute distress Mood and affect are appropriate Heart: Regular rate and rhythm no rubs murmurs or extra sounds Lungs: Clear to auscultation, breathing unlabored, no rales or wheezes Abdomen: Positive bowel sounds, soft nontender to palpation, nondistended Extremities: No clubbing, cyanosis, or edema Skin: No evidence of breakdown, no evidence of rash Neurologic: Cranial nerves II through XII intact, motor strength is 5/5 in bilateral deltoid, bicep, tricep, grip, 5/5 RIgh tand 4/5 left hip flexor, knee extensors, 5/5 bilateral ankle dorsiflexor and plantar flexor  Sensory exam normal sensation to light touch and proprioception in bilateral upper and lower extremities  Musculoskeletal: Full range of motion in all 4 extremities. No joint swelling    Assessment/Plan: 1. 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 Stable for D/C today F/u PCP in 3-4 weeks F/u Pulm next month F/u Cards Dr Wonda per his office  F/u Onc  2 weeks See D/C summary See D/C instructions   No PMR f/u needed  Care  Tool:  Bathing    Body parts bathed by patient: Right arm, Left arm, Chest, Abdomen, Right upper leg, Left upper leg, Face, Buttocks, Front perineal area, Right lower leg, Left lower leg   Body parts bathed by helper: Right lower leg, Left lower leg     Bathing assist Assist Level: Supervision/Verbal cueing     Upper Body Dressing/Undressing Upper body dressing   What is the patient wearing?: Pull over shirt    Upper body assist Assist Level: Set up assist    Lower Body Dressing/Undressing Lower body dressing      What is the patient wearing?: Pants, Underwear/pull up     Lower body assist Assist for lower body dressing: Supervision/Verbal cueing     Toileting Toileting    Toileting assist Assist for toileting: Supervision/Verbal cueing     Transfers Chair/bed transfer  Transfers assist     Chair/bed transfer assist level: Supervision/Verbal cueing     Locomotion Ambulation   Ambulation assist      Assist level: Supervision/Verbal cueing Assistive device: No Device Max distance: 187   Walk 10 feet activity  Assist     Assist level: Supervision/Verbal cueing Assistive device: No Device   Walk 50 feet activity   Assist    Assist level: Supervision/Verbal cueing Assistive device: No Device    Walk 150 feet activity   Assist Walk 150 feet activity did not occur: Safety/medical concerns (endurance)  Assist level: Supervision/Verbal cueing Assistive device: No Device    Walk 10 feet on uneven surface  activity   Assist     Assist level: Supervision/Verbal cueing Assistive device: Other (comment) (none)   Wheelchair     Assist Is the patient using a wheelchair?: No Type of Wheelchair: Manual    Wheelchair assist level: Dependent - Patient 0%      Wheelchair 50 feet with 2 turns activity    Assist        Assist Level: Dependent - Patient 0%   Wheelchair 150 feet activity     Assist      Assist Level:  Dependent - Patient 0%   Blood pressure (!) 152/56, pulse 86, temperature 98.3 F (36.8 C), resp. rate 18, height 5' 2 (1.575 m), weight 83.2 kg, last menstrual period 10/30/2014, SpO2 97%.  Medical Problem List and Plan: 1. 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  Antithrombotics: -DVT/anticoagulation:  Mechanical: Sequential compression devices, below knee Bilateral lower extremities Pharmaceutical: Eliquis 5 mg BID              -antiplatelet therapy: N/A 3. Pain Management: History of chronic pain syndrome on scheduled Lyrica  50 mg 3 times daily and oxycodone  10 mg as needed.          Monitor for any adverse effects on mentation.  Has rx from Onc 90 tabs Oxy IR 10mg  at home  4. Mood/Behavior/Sleep: LCSW to follow for evaluation and support when available.              -antipsychotic agents: Prozac --- holding Wellbutrin  XL  5. Neuropsych/cognition: This patient may be capable of making decisions on her own behalf. 6. Skin/Wound Care: Routine pressure relief measures.  7. Fluids/Electrolytes/Nutrition: Monitor intake and output.    Latest Ref Rng & Units 06/11/2024    5:22 AM 06/08/2024    1:59 PM 06/03/2024    5:58 AM  BMP  Glucose 70 - 99 mg/dL 836  794  860   BUN 8 - 23 mg/dL 9  9  8    Creatinine 0.44 - 1.00 mg/dL 9.33  9.23  9.32   Sodium 135 - 145 mmol/L 135  132  136   Potassium 3.5 - 5.1 mmol/L 4.3  4.0  4.0   Chloride 98 - 111 mmol/L 95  96  100   CO2 22 - 32 mmol/L 26  24  26    Calcium  8.9 - 10.3 mg/dL 9.0  9.0  8.6    Normal 10/9 8.  DVT: LE venous Doppler positive for right peroneal vein acute DVT on Effient  PTA now on Eliquis 5 mg  twice daily.  9. T2DM: A1c 9.2 uncontrolled diabetes.  On  home metformin 1000 mg BID -Monitor CBG ACHS with SSI.   - Change diet, carb modified.  CBG (last 3)  Recent Labs    06/11/24 1639 06/11/24 2019 06/12/24 0614  GLUCAP 81 200* 211*  Low CBG reduce lantus  to 15U- improved am CBG 10/8 Took tresiba at home- will resume , can also get rx for novalog for SSI- f/u with PCP 10. HLD: LDL 40, Lipitor  80 mg daily 11.  CAD: NSTEMI s/p stenting cardiology agreeable with Eliquis alone 12.  Uterine CA: Followed by Dr. Lonn. Received 1 cycle of treatment with chemo-Keytruda  Taxol  carboplatin .  Repeat cycle wad due 06/02/24. F/u as OP  Chemo interrupted by hospital admissions 04/20/24,05/04/24,05/21/24, as well as present admit 05/30/24 13.  Chronic respiratory failure: Wears 2L at baseline and CPAP at night.  Continue guaifenesin  600 mg twice daily       Has Pulm f/u next month  14.  HTN: BP stable.  Long-term goal normotensive  Fair control  Vitals:   06/11/24 2021 06/12/24 0435  BP: (!) 137/56 (!) 152/56  Pulse: 80 86  Resp: 19 18  Temp: 98.1 F (36.7 C) 98.3 F (36.8 C)  SpO2: 100% 97%   May resume losartan  at home  15.  Obesity: BMI 34.27, educate on diet and weight loss to promote overall health and mobility.  16. Intertrigo. Nystatin cream 17.  OSA. on CPAP at home  18.  Normochromic , normocytic anemia , abnormal x ~1 yr , ? CA related , no recent hemocults, will order since pt is now on Eliquis - still pnd  25. Visuoperceptual issues due to RIght parietal involvement post bilateral CVA with left negect + HH LOS: 9 days A FACE TO FACE EVALUATION WAS PERFORMED  Brenda [name] 06/12/2024, 8:08 AM

## 2024-06-15 ENCOUNTER — Telehealth: Payer: Self-pay

## 2024-06-15 NOTE — Telephone Encounter (Signed)
 Returned her call and review appts/ answered questions regarding port. She verbalized understanding.

## 2024-06-18 DIAGNOSIS — Z7189 Other specified counseling: Secondary | ICD-10-CM | POA: Insufficient documentation

## 2024-06-18 DIAGNOSIS — R69 Illness, unspecified: Secondary | ICD-10-CM | POA: Insufficient documentation

## 2024-06-19 ENCOUNTER — Other Ambulatory Visit

## 2024-06-19 ENCOUNTER — Ambulatory Visit: Admitting: Hematology and Oncology

## 2024-06-19 ENCOUNTER — Emergency Department (HOSPITAL_COMMUNITY)

## 2024-06-19 ENCOUNTER — Inpatient Hospital Stay (HOSPITAL_COMMUNITY)
Admission: EM | Admit: 2024-06-19 | Discharge: 2024-06-22 | DRG: 070 | Disposition: A | Discharge status: Home / Self Care | Attending: Internal Medicine | Admitting: Internal Medicine

## 2024-06-19 ENCOUNTER — Ambulatory Visit

## 2024-06-19 ENCOUNTER — Other Ambulatory Visit: Payer: Self-pay

## 2024-06-19 DIAGNOSIS — F329 Major depressive disorder, single episode, unspecified: Secondary | ICD-10-CM | POA: Diagnosis present

## 2024-06-19 DIAGNOSIS — I639 Cerebral infarction, unspecified: Secondary | ICD-10-CM | POA: Diagnosis not present

## 2024-06-19 DIAGNOSIS — D72829 Elevated white blood cell count, unspecified: Secondary | ICD-10-CM | POA: Diagnosis present

## 2024-06-19 DIAGNOSIS — I2510 Atherosclerotic heart disease of native coronary artery without angina pectoris: Secondary | ICD-10-CM | POA: Diagnosis present

## 2024-06-19 DIAGNOSIS — F419 Anxiety disorder, unspecified: Secondary | ICD-10-CM | POA: Diagnosis present

## 2024-06-19 DIAGNOSIS — F22 Delusional disorders: Secondary | ICD-10-CM | POA: Diagnosis present

## 2024-06-19 DIAGNOSIS — I69354 Hemiplegia and hemiparesis following cerebral infarction affecting left non-dominant side: Secondary | ICD-10-CM

## 2024-06-19 DIAGNOSIS — D735 Infarction of spleen: Secondary | ICD-10-CM | POA: Diagnosis present

## 2024-06-19 DIAGNOSIS — I5032 Chronic diastolic (congestive) heart failure: Secondary | ICD-10-CM | POA: Diagnosis present

## 2024-06-19 DIAGNOSIS — F3289 Other specified depressive episodes: Secondary | ICD-10-CM | POA: Diagnosis not present

## 2024-06-19 DIAGNOSIS — M160 Bilateral primary osteoarthritis of hip: Secondary | ICD-10-CM | POA: Diagnosis present

## 2024-06-19 DIAGNOSIS — N390 Urinary tract infection, site not specified: Secondary | ICD-10-CM | POA: Diagnosis present

## 2024-06-19 DIAGNOSIS — Z8249 Family history of ischemic heart disease and other diseases of the circulatory system: Secondary | ICD-10-CM

## 2024-06-19 DIAGNOSIS — Z825 Family history of asthma and other chronic lower respiratory diseases: Secondary | ICD-10-CM

## 2024-06-19 DIAGNOSIS — I110 Hypertensive heart disease with heart failure: Secondary | ICD-10-CM | POA: Diagnosis present

## 2024-06-19 DIAGNOSIS — I82441 Acute embolism and thrombosis of right tibial vein: Secondary | ICD-10-CM | POA: Diagnosis present

## 2024-06-19 DIAGNOSIS — G8929 Other chronic pain: Secondary | ICD-10-CM | POA: Diagnosis present

## 2024-06-19 DIAGNOSIS — Z833 Family history of diabetes mellitus: Secondary | ICD-10-CM

## 2024-06-19 DIAGNOSIS — Z9889 Other specified postprocedural states: Secondary | ICD-10-CM

## 2024-06-19 DIAGNOSIS — B961 Klebsiella pneumoniae [K. pneumoniae] as the cause of diseases classified elsewhere: Secondary | ICD-10-CM | POA: Diagnosis present

## 2024-06-19 DIAGNOSIS — G9341 Metabolic encephalopathy: Secondary | ICD-10-CM | POA: Diagnosis present

## 2024-06-19 DIAGNOSIS — I452 Bifascicular block: Secondary | ICD-10-CM | POA: Diagnosis present

## 2024-06-19 DIAGNOSIS — Z90710 Acquired absence of both cervix and uterus: Secondary | ICD-10-CM

## 2024-06-19 DIAGNOSIS — Z5941 Food insecurity: Secondary | ICD-10-CM

## 2024-06-19 DIAGNOSIS — Z794 Long term (current) use of insulin: Secondary | ICD-10-CM | POA: Diagnosis not present

## 2024-06-19 DIAGNOSIS — M549 Dorsalgia, unspecified: Secondary | ICD-10-CM | POA: Diagnosis present

## 2024-06-19 DIAGNOSIS — R410 Disorientation, unspecified: Secondary | ICD-10-CM | POA: Diagnosis present

## 2024-06-19 DIAGNOSIS — C541 Malignant neoplasm of endometrium: Secondary | ICD-10-CM | POA: Diagnosis present

## 2024-06-19 DIAGNOSIS — Z888 Allergy status to other drugs, medicaments and biological substances status: Secondary | ICD-10-CM

## 2024-06-19 DIAGNOSIS — E1165 Type 2 diabetes mellitus with hyperglycemia: Secondary | ICD-10-CM | POA: Diagnosis present

## 2024-06-19 DIAGNOSIS — E1142 Type 2 diabetes mellitus with diabetic polyneuropathy: Secondary | ICD-10-CM | POA: Diagnosis present

## 2024-06-19 DIAGNOSIS — J9611 Chronic respiratory failure with hypoxia: Secondary | ICD-10-CM | POA: Diagnosis present

## 2024-06-19 DIAGNOSIS — E7800 Pure hypercholesterolemia, unspecified: Secondary | ICD-10-CM | POA: Diagnosis present

## 2024-06-19 DIAGNOSIS — R4182 Altered mental status, unspecified: Secondary | ICD-10-CM | POA: Diagnosis not present

## 2024-06-19 DIAGNOSIS — I82451 Acute embolism and thrombosis of right peroneal vein: Secondary | ICD-10-CM | POA: Diagnosis present

## 2024-06-19 DIAGNOSIS — I252 Old myocardial infarction: Secondary | ICD-10-CM

## 2024-06-19 DIAGNOSIS — D6869 Other thrombophilia: Secondary | ICD-10-CM | POA: Diagnosis present

## 2024-06-19 DIAGNOSIS — I69320 Aphasia following cerebral infarction: Secondary | ICD-10-CM

## 2024-06-19 DIAGNOSIS — I2699 Other pulmonary embolism without acute cor pulmonale: Secondary | ICD-10-CM | POA: Diagnosis present

## 2024-06-19 DIAGNOSIS — Z79630 Long term (current) use of alkylating agent: Secondary | ICD-10-CM

## 2024-06-19 DIAGNOSIS — M1909 Primary osteoarthritis, other specified site: Secondary | ICD-10-CM | POA: Diagnosis present

## 2024-06-19 DIAGNOSIS — B372 Candidiasis of skin and nail: Secondary | ICD-10-CM | POA: Diagnosis present

## 2024-06-19 DIAGNOSIS — Z7984 Long term (current) use of oral hypoglycemic drugs: Secondary | ICD-10-CM

## 2024-06-19 DIAGNOSIS — Z885 Allergy status to narcotic agent status: Secondary | ICD-10-CM

## 2024-06-19 DIAGNOSIS — Z808 Family history of malignant neoplasm of other organs or systems: Secondary | ICD-10-CM

## 2024-06-19 DIAGNOSIS — C7800 Secondary malignant neoplasm of unspecified lung: Secondary | ICD-10-CM | POA: Diagnosis present

## 2024-06-19 DIAGNOSIS — Z87440 Personal history of urinary (tract) infections: Secondary | ICD-10-CM

## 2024-06-19 DIAGNOSIS — Z823 Family history of stroke: Secondary | ICD-10-CM

## 2024-06-19 DIAGNOSIS — C772 Secondary and unspecified malignant neoplasm of intra-abdominal lymph nodes: Secondary | ICD-10-CM | POA: Diagnosis present

## 2024-06-19 DIAGNOSIS — Z86711 Personal history of pulmonary embolism: Secondary | ICD-10-CM | POA: Diagnosis not present

## 2024-06-19 DIAGNOSIS — Z79899 Other long term (current) drug therapy: Secondary | ICD-10-CM

## 2024-06-19 DIAGNOSIS — R569 Unspecified convulsions: Secondary | ICD-10-CM | POA: Diagnosis not present

## 2024-06-19 DIAGNOSIS — M19041 Primary osteoarthritis, right hand: Secondary | ICD-10-CM | POA: Diagnosis present

## 2024-06-19 DIAGNOSIS — Z9981 Dependence on supplemental oxygen: Secondary | ICD-10-CM

## 2024-06-19 DIAGNOSIS — Z7901 Long term (current) use of anticoagulants: Secondary | ICD-10-CM

## 2024-06-19 DIAGNOSIS — I69318 Other symptoms and signs involving cognitive functions following cerebral infarction: Secondary | ICD-10-CM

## 2024-06-19 DIAGNOSIS — Z170 Estrogen receptor positive status [ER+]: Secondary | ICD-10-CM

## 2024-06-19 DIAGNOSIS — G4733 Obstructive sleep apnea (adult) (pediatric): Secondary | ICD-10-CM | POA: Diagnosis present

## 2024-06-19 DIAGNOSIS — Z90722 Acquired absence of ovaries, bilateral: Secondary | ICD-10-CM

## 2024-06-19 DIAGNOSIS — T451X5A Adverse effect of antineoplastic and immunosuppressive drugs, initial encounter: Secondary | ICD-10-CM | POA: Diagnosis present

## 2024-06-19 DIAGNOSIS — F05 Delirium due to known physiological condition: Secondary | ICD-10-CM | POA: Diagnosis not present

## 2024-06-19 DIAGNOSIS — Z87891 Personal history of nicotine dependence: Secondary | ICD-10-CM

## 2024-06-19 DIAGNOSIS — L658 Other specified nonscarring hair loss: Secondary | ICD-10-CM | POA: Diagnosis present

## 2024-06-19 DIAGNOSIS — E871 Hypo-osmolality and hyponatremia: Secondary | ICD-10-CM | POA: Diagnosis present

## 2024-06-19 DIAGNOSIS — M19042 Primary osteoarthritis, left hand: Secondary | ICD-10-CM | POA: Diagnosis present

## 2024-06-19 DIAGNOSIS — Z7969 Long term (current) use of other immunomodulators and immunosuppressants: Secondary | ICD-10-CM

## 2024-06-19 DIAGNOSIS — Z955 Presence of coronary angioplasty implant and graft: Secondary | ICD-10-CM

## 2024-06-19 DIAGNOSIS — Z923 Personal history of irradiation: Secondary | ICD-10-CM

## 2024-06-19 DIAGNOSIS — Z79633 Long term (current) use of mitotic inhibitor: Secondary | ICD-10-CM

## 2024-06-19 DIAGNOSIS — Z9049 Acquired absence of other specified parts of digestive tract: Secondary | ICD-10-CM

## 2024-06-19 LAB — URINALYSIS, ROUTINE W REFLEX MICROSCOPIC
Bilirubin Urine: NEGATIVE
Glucose, UA: NEGATIVE mg/dL
Hgb urine dipstick: NEGATIVE
Ketones, ur: 5 mg/dL — AB
Nitrite: NEGATIVE
Protein, ur: 30 mg/dL — AB
Specific Gravity, Urine: 1.02 (ref 1.005–1.030)
WBC, UA: >50 WBC/hpf (ref 0–5)
pH: 5 (ref 5.0–8.0)

## 2024-06-19 LAB — COMPREHENSIVE METABOLIC PANEL WITH GFR
ALT: 10 U/L (ref 0–44)
AST: 15 U/L (ref 15–41)
Albumin: 2.9 g/dL — ABNORMAL LOW (ref 3.5–5.0)
Alkaline Phosphatase: 77 U/L (ref 38–126)
Anion gap: 16 — ABNORMAL HIGH (ref 5–15)
BUN: 9 mg/dL (ref 8–23)
CO2: 21 mmol/L — ABNORMAL LOW (ref 22–32)
Calcium: 9.1 mg/dL (ref 8.9–10.3)
Chloride: 94 mmol/L — ABNORMAL LOW (ref 98–111)
Creatinine, Ser: 0.64 mg/dL (ref 0.44–1.00)
GFR, Estimated: >60 mL/min (ref >60)
Glucose, Bld: 236 mg/dL — ABNORMAL HIGH (ref 70–99)
Potassium: 3.9 mmol/L (ref 3.5–5.1)
Sodium: 131 mmol/L — ABNORMAL LOW (ref 135–145)
Total Bilirubin: 0.6 mg/dL (ref 0.0–1.2)
Total Protein: 6.9 g/dL (ref 6.5–8.1)

## 2024-06-19 LAB — CBG MONITORING, ED
Glucose-Capillary: 238 mg/dL — ABNORMAL HIGH (ref 70–99)
Glucose-Capillary: 141 mg/dL — ABNORMAL HIGH (ref 70–99)
Glucose-Capillary: 120 mg/dL — ABNORMAL HIGH (ref 70–99)
Glucose-Capillary: 122 mg/dL — ABNORMAL HIGH (ref 70–99)

## 2024-06-19 LAB — CBC
HCT: 32 % — ABNORMAL LOW (ref 36.0–46.0)
Hemoglobin: 10.1 g/dL — ABNORMAL LOW (ref 12.0–15.0)
MCH: 27 pg (ref 26.0–34.0)
MCHC: 31.6 g/dL (ref 30.0–36.0)
MCV: 85.6 fL (ref 80.0–100.0)
Platelets: 310 K/uL (ref 150–400)
RBC: 3.74 MIL/uL — ABNORMAL LOW (ref 3.87–5.11)
RDW: 15.8 % — ABNORMAL HIGH (ref 11.5–15.5)
WBC: 11.6 K/uL — ABNORMAL HIGH (ref 4.0–10.5)
nRBC: 0 % (ref 0.0–0.2)

## 2024-06-19 LAB — HEPARIN LEVEL (UNFRACTIONATED): Heparin Unfractionated: >1.1 [IU]/mL — ABNORMAL HIGH (ref 0.30–0.70)

## 2024-06-19 LAB — BRAIN NATRIURETIC PEPTIDE: B Natriuretic Peptide: 161.6 pg/mL — ABNORMAL HIGH (ref 0.0–100.0)

## 2024-06-19 LAB — APTT: aPTT: 37 s — ABNORMAL HIGH (ref 24–36)

## 2024-06-19 LAB — TROPONIN I (HIGH SENSITIVITY)
Troponin I (High Sensitivity): 16 ng/L (ref <18)
Troponin I (High Sensitivity): 18 ng/L — ABNORMAL HIGH (ref <18)

## 2024-06-19 LAB — GLUCOSE, CAPILLARY: Glucose-Capillary: 106 mg/dL — ABNORMAL HIGH (ref 70–99)

## 2024-06-19 MED ORDER — SODIUM CHLORIDE 0.9 % IV SOLN
1.0000 g | INTRAVENOUS | Status: DC
  Administered 2024-06-20 – 2024-06-21 (×2): 1 g via INTRAVENOUS
  Filled 2024-06-19 (×2): qty 10

## 2024-06-19 MED ORDER — BISACODYL 5 MG PO TBEC: 5.0000 mg | DELAYED_RELEASE_TABLET | Freq: Every day | ORAL | Status: DC | PRN

## 2024-06-19 MED ORDER — HEPARIN (PORCINE) 25000 UT/250ML-% IV SOLN
1500.0000 [IU]/h | INTRAVENOUS | Status: DC
  Administered 2024-06-19: 1250 [IU]/h via INTRAVENOUS
  Administered 2024-06-20: 1400 [IU]/h via INTRAVENOUS
  Administered 2024-06-21: 1450 [IU]/h via INTRAVENOUS
  Filled 2024-06-19 (×3): qty 250

## 2024-06-19 MED ORDER — INSULIN GLARGINE-YFGN 100 UNIT/ML ~~LOC~~ SOLN
16.0000 [IU] | Freq: Every day | SUBCUTANEOUS | Status: DC
  Administered 2024-06-20: 16 [IU] via SUBCUTANEOUS
  Filled 2024-06-19 (×3): qty 0.16

## 2024-06-19 MED ORDER — ONDANSETRON HCL 4 MG/2ML IJ SOLN
4.0000 mg | Freq: Once | INTRAMUSCULAR | Status: AC
  Administered 2024-06-19: 4 mg via INTRAVENOUS
  Filled 2024-06-19: qty 2

## 2024-06-19 MED ORDER — INSULIN ASPART 100 UNIT/ML IJ SOLN
0.0000 [IU] | Freq: Every day | INTRAMUSCULAR | Status: DC
  Administered 2024-06-20: 4 [IU] via SUBCUTANEOUS
  Administered 2024-06-21: 2 [IU] via SUBCUTANEOUS

## 2024-06-19 MED ORDER — OXYCODONE HCL 5 MG PO TABS
10.0000 mg | ORAL_TABLET | Freq: Four times a day (QID) | ORAL | Status: DC | PRN
  Administered 2024-06-20 – 2024-06-22 (×7): 10 mg via ORAL
  Filled 2024-06-19 (×7): qty 2

## 2024-06-19 MED ORDER — FLUOXETINE HCL 20 MG PO CAPS
40.0000 mg | ORAL_CAPSULE | Freq: Every day | ORAL | Status: DC
  Administered 2024-06-20 – 2024-06-22 (×3): 40 mg via ORAL
  Filled 2024-06-19 (×3): qty 2

## 2024-06-19 MED ORDER — SODIUM CHLORIDE 0.9 % IV SOLN: 2.0000 g | Freq: Once | INTRAVENOUS | Status: DC

## 2024-06-19 MED ORDER — ONDANSETRON HCL 4 MG PO TABS: 4.0000 mg | ORAL_TABLET | Freq: Four times a day (QID) | ORAL | Status: DC | PRN

## 2024-06-19 MED ORDER — ONDANSETRON HCL 4 MG/2ML IJ SOLN
4.0000 mg | Freq: Four times a day (QID) | INTRAMUSCULAR | Status: DC | PRN
  Administered 2024-06-19: 4 mg via INTRAVENOUS
  Filled 2024-06-19: qty 2

## 2024-06-19 MED ORDER — MORPHINE SULFATE (PF) 4 MG/ML IV SOLN
4.0000 mg | Freq: Once | INTRAVENOUS | Status: AC
  Administered 2024-06-19: 4 mg via INTRAVENOUS
  Filled 2024-06-19: qty 1

## 2024-06-19 MED ORDER — LIDOCAINE 5 % EX PTCH
1.0000 | MEDICATED_PATCH | CUTANEOUS | Status: DC
  Administered 2024-06-19 – 2024-06-21 (×3): 1 via TRANSDERMAL
  Filled 2024-06-19 (×2): qty 1

## 2024-06-19 MED ORDER — IOHEXOL 350 MG/ML SOLN
75.0000 mL | Freq: Once | INTRAVENOUS | Status: AC | PRN
  Administered 2024-06-19: 75 mL via INTRAVENOUS

## 2024-06-19 MED ORDER — GUAIFENESIN ER 600 MG PO TB12: 600.0000 mg | ORAL_TABLET | Freq: Two times a day (BID) | ORAL | Status: DC | PRN

## 2024-06-19 MED ORDER — INSULIN ASPART 100 UNIT/ML IJ SOLN
0.0000 [IU] | Freq: Three times a day (TID) | INTRAMUSCULAR | Status: DC
  Administered 2024-06-19: 1 [IU] via SUBCUTANEOUS
  Administered 2024-06-20: 3 [IU] via SUBCUTANEOUS
  Administered 2024-06-20: 5 [IU] via SUBCUTANEOUS
  Administered 2024-06-20: 2 [IU] via SUBCUTANEOUS
  Administered 2024-06-21: 3 [IU] via SUBCUTANEOUS
  Administered 2024-06-21: 9 [IU] via SUBCUTANEOUS
  Administered 2024-06-21 – 2024-06-22 (×2): 5 [IU] via SUBCUTANEOUS
  Administered 2024-06-22: 9 [IU] via SUBCUTANEOUS

## 2024-06-19 MED ORDER — POLYETHYLENE GLYCOL 3350 17 G PO PACK: 17.0000 g | PACK | Freq: Every day | ORAL | Status: DC | PRN

## 2024-06-19 MED ORDER — SODIUM CHLORIDE 0.9 % IV SOLN
1.0000 g | Freq: Once | INTRAVENOUS | Status: AC
  Administered 2024-06-19: 1 g via INTRAVENOUS
  Filled 2024-06-19: qty 10

## 2024-06-19 MED ORDER — ACETAMINOPHEN 650 MG RE SUPP: 650.0000 mg | Freq: Four times a day (QID) | RECTAL | Status: DC | PRN

## 2024-06-19 MED ORDER — MORPHINE SULFATE (PF) 2 MG/ML IV SOLN
2.0000 mg | INTRAVENOUS | Status: DC | PRN
  Administered 2024-06-19 – 2024-06-20 (×2): 2 mg via INTRAVENOUS
  Filled 2024-06-19 (×2): qty 1

## 2024-06-19 MED ORDER — ACETAMINOPHEN 325 MG PO TABS
650.0000 mg | ORAL_TABLET | Freq: Four times a day (QID) | ORAL | Status: DC | PRN
  Administered 2024-06-20: 650 mg via ORAL
  Filled 2024-06-19: qty 2

## 2024-06-19 MED ORDER — SODIUM CHLORIDE 0.9 % IV BOLUS
500.0000 mL | Freq: Once | INTRAVENOUS | Status: AC
  Administered 2024-06-19: 500 mL via INTRAVENOUS

## 2024-06-19 MED ORDER — INSULIN ASPART 100 UNIT/ML IJ SOLN: 5.0000 [IU] | Freq: Once | INTRAMUSCULAR | Status: DC

## 2024-06-19 MED ORDER — ATORVASTATIN CALCIUM 80 MG PO TABS
80.0000 mg | ORAL_TABLET | Freq: Every evening | ORAL | Status: DC
  Administered 2024-06-20 – 2024-06-21 (×2): 80 mg via ORAL
  Filled 2024-06-19 (×2): qty 1

## 2024-06-19 MED FILL — Fosaprepitant Dimeglumine For IV Infusion 150 MG (Base Eq): INTRAVENOUS | Qty: 5 | Status: AC

## 2024-06-19 NOTE — ED Provider Notes (Signed)
 Care assumed from previous provider.  See note for full HPI.  In summation 63 year old with recent admission for CVA sent home on Eliquis here for evaluation of confusion, weakness and difficulty with word finding.  Has known metastatic cancer.-Has had difficulty with word finding and weakness since she had her CVA 2 weeks ago however family said she has been more confused since yesterday.  No new unilateral deficits, vision changes.  Workup today shows UTI.  She is pending CT head and abdomen pelvis.  Plan on admission after imaging results. Physical Exam  BP (!) 129/49 (BP Location: Right Arm)   Pulse 76   Temp 97.7 F (36.5 C)   Resp (!) 25   Ht 5' 2 (1.575 m)   Wt 83.2 kg   LMP 10/30/2014 (Approximate)   SpO2 100%   BMI 33.55 kg/m   Physical Exam Vitals and nursing note reviewed.  Constitutional:      General: She is not in acute distress.    Appearance: She is well-developed. She is not ill-appearing or diaphoretic.  HENT:     Head: Atraumatic.  Eyes:     Pupils: Pupils are equal, round, and reactive to light.  Cardiovascular:     Rate and Rhythm: Normal rate and regular rhythm.     Pulses: Normal pulses.     Heart sounds: Normal heart sounds.  Pulmonary:     Effort: Pulmonary effort is normal. No respiratory distress.  Chest:     Comments: Port right upper chest wall Abdominal:     General: Bowel sounds are normal. There is no distension.     Palpations: Abdomen is soft.     Tenderness: There is generalized abdominal tenderness.     Comments: Diffuse abdominal tenderness  Musculoskeletal:        General: Normal range of motion.     Cervical back: Normal range of motion and neck supple.     Comments: Compartment soft, no bony tenderness  Skin:    General: Skin is warm and dry.  Neurological:     General: No focal deficit present.     Mental Status: She is alert and oriented to person, place, and time.    Procedures  .Critical Care  Performed by: Edie Rosebud LABOR, PA-C Authorized by: Edie Rosebud LABOR, PA-C   Critical care provider statement:    Critical care time (minutes):  35   Critical care was time spent personally by me on the following activities:  Development of treatment plan with patient or surrogate, discussions with consultants, evaluation of patient's response to treatment, examination of patient, ordering and review of laboratory studies, ordering and review of radiographic studies, ordering and performing treatments and interventions, pulse oximetry, re-evaluation of patient's condition and review of old charts  ED Course / MDM   Clinical Course as of 06/19/24 1708  Fri Jun 19, 2024  1521 Recent CVA, Met CA. Dc on Eliquis. Worsening confusion. Chronic unequal pupils. Admit after imaging [BH]    Clinical Course User Index [BH] Tomica Arseneault A, PA-C    Seen from previous provider.  See note for full HPI.  63 year old recent admission for CVA comes in with worsening confusion over the last 24 hours.  She has some abdominal pain however has some chronic pain due to her known metastatic cancer, possibly worse today.  Here she has UTI she was given Rocephin  and fluids.  Plan to follow-up on her CT head abdomen pelvis with likely admission  Labs and imaging  personally viewed and interpreted  I was called by radiology.  Patient has new PE and splenic infarct on her CT abdomen pelvis.  CT head has her evolving prior CVA, no new infarcts, bleed.  Will start the patient on Heparin  per pharm given she took her Eliquis this morning  Discussed with patient, significant other at bedside.  Agreeable for admission.  Updated on labs and imaging with new PE and splenic infarct.  CT head reassuring without new CVA, bleed.  Query of PE, splenic infarct failed Eliquis.  Discussed with Dr. Fausto with medicine team he is agreeable to evaluate patient for admission.  The patient appears reasonably stabilized for admission considering the  current resources, flow, and capabilities available in the ED at this time, and I doubt any other [facility] requiring further screening and/or treatment in the ED prior to admission.       Medical Decision Making Amount and/or Complexity of Data Reviewed Independent Historian: spouse External Data Reviewed: labs, radiology, ECG and notes. Labs: ordered. Decision-making details documented in ED Course. Radiology: ordered and independent interpretation performed. Decision-making details documented in ED Course. ECG/medicine tests: ordered and independent interpretation performed. Decision-making details documented in ED Course.  Risk OTC drugs. Prescription drug management. Parenteral controlled substances. Decision regarding hospitalization. Diagnosis or treatment significantly limited by social determinants of health.        Marri Mcneff A, PA-C 06/19/24 1708    Neysa Caron PARAS, [place] 06/19/24 1826

## 2024-06-19 NOTE — ED Triage Notes (Addendum)
 Pt. Stated, I had a stroke 2 weeks ago. Ive been confused and I think its gotten worse since yesterday. Pt is Alert and oriented. To DOB, President, and month. No arm drift, no weakness  in legs. Husband stated she has been taking Oxycodone   so not sure if this causing some confusion

## 2024-06-19 NOTE — ED Notes (Signed)
 Extra Blue, Red, SST & DG top drawn/ Pt unable to provide urine, sample cup in hand

## 2024-06-19 NOTE — ED Notes (Signed)
 Pt CBG 238, according to husband Pt given 10 units of insulin  at home around 0915

## 2024-06-19 NOTE — H&P (Signed)
 History and Physical    Patient: Brenda [name] FMW:[id number] DOB: November 22, 1960 DOA: 06/19/2024 DOS: the patient was seen and examined on 06/19/2024 PCP: [facility]  Patient coming from: Home  Chief Complaint:  Chief Complaint  Patient presents with   Altered Mental Status     HPI: Brenda [name] is a 63 y.o. 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  Pt had had difficulty with word finding and vision since her stroke Oct 1st, as well as intermittent confusion.  This has worsened in recent days, and she reports mid/lower abdominal discomfort.  Reported a mild headache this AM.  No fever/chills, nausea/vomiting, no dysuria or urinary frequency. Does note history of prior UTI's, typically she notices back pain, this time lower abdominal discomfort.  Vision changes since stroke seem just slightly worse recently.  She's had increased pain and weakness in her left leg since the stroke.  Pt reports not missing any doses of Eliquis since starting on it during admission for stroke in October.   ED course --  Initial vitals -- Temp 98.6 F, HR 92, RR 18, BP 141/65, spO2 92-100% on room air Labs obtained including CMP and CBC were notable for Na 131, Cl 94, bicarb 21, glucose 236, anion gap 16, WBC 11.6, Hbg 10.1. UA was moderate leukocytes, many bacteria, > 50 WBC's.  Sent for urine culture.   Imaging -- CT head showed evolving infarct of the right posterior & medial temporal lobe with no evidence of new  infarct. CT abdomen/pelvis most notable for an incidentally found proximal segmental RLL PE, small splenic infarct, stable appearance of left psoas muscle infiltrative necrotic mass, no other acute findings.  Pt is being admitted for further evaluation and management of UTI, acute PE and splenic infarct as outlined in detail below.    Review of Systems: As mentioned in the history of present illness. All other systems reviewed and are negative.   Past Medical History:  Diagnosis Date   Anemia    Arthritis    back, hands, hips (02/22/2015)   CAD (coronary artery disease)    a. complex LAD/diagonal bifurcation PCI in 2010. b. STEMI 10/2019 s/p PTCA/DES x1 to mLAD overlapping the old stent, residual disease treated medicaly.   Cancer [facility])    uterine   CHF (congestive heart failure) (HCC)    Depression    Diabetes mellitus (HCC)    started when I was pregnant; not sure if it was type 1 or type 2    History of radiation therapy    Endometrium- HDR 01/22/22-03/07/22- Dr. Lynwood Nasuti   Hypercholesterolemia    Hypertension    Morbid obesity (HCC)    Myocardial infarction (HCC)    mild x 3   Neuromuscular disorder (HCC)    neuropathy feet   Sleep apnea    cpap/   Past Surgical History:  Procedure Laterality Date   CARDIAC CATHETERIZATION  12/02/2012   CHOLECYSTECTOMY OPEN  09/04/1987   CORONARY ANGIOPLASTY WITH STENT PLACEMENT  09/03/2009   CORONARY/GRAFT ACUTE MI REVASCULARIZATION N/A 10/26/2019   Procedure:  CORONARY/GRAFT ACUTE MI REVASCULARIZATION;  Surgeon: Verlin Lonni BIRCH, MD;  Location: [facility] INVASIVE CV LAB;  Service: Cardiovascular;  Laterality: N/A;   INCISIONAL HERNIA REPAIR N/A 12/09/2021   Procedure: LAPAROSCOPIC REPAIR UMBILICAL HERNIA WITH MESH;  Surgeon: Vernetta Berg, MD;  Location: [facility] ORS;  Service: General;  Laterality: N/A;   IR IMAGING GUIDED PORT INSERTION  04/08/2024   LEFT HEART CATH AND CORONARY ANGIOGRAPHY N/A 10/26/2019   Procedure: LEFT HEART  CATH AND CORONARY ANGIOGRAPHY;  Surgeon: Verlin Lonni BIRCH, MD;  Location: [facility] INVASIVE CV LAB;  Service: Cardiovascular;  Laterality: N/A;   LEFT HEART CATHETERIZATION WITH CORONARY ANGIOGRAM N/A 12/25/2012   Procedure: LEFT HEART CATHETERIZATION WITH CORONARY ANGIOGRAM;  Surgeon: Ozell Fell, MD;  Location: [facility] CATH LAB;  Service: Cardiovascular;  Laterality: N/A;   RADIOLOGY WITH ANESTHESIA N/A 04/21/2024   Procedure: MRI WITH ANESTHESIA;  Surgeon: Radiologist, Medication, MD;  Location: [facility] OR;  Service: Radiology;  Laterality: N/A;  BRAIN W/ W/O   ROBOTIC ASSISTED TOTAL HYSTERECTOMY WITH BILATERAL SALPINGO OOPHERECTOMY N/A 12/06/2021   Procedure: XI ROBOTIC ASSISTED TOTAL HYSTERECTOMY WITH BILATERAL SALPINGO OOPHORECTOMY;  Surgeon: [name] SAUNDERS, MD;  Location: [facility] ORS;  Service: Gynecology;  Laterality: N/A;   UMBILICAL HERNIA REPAIR  05/04/1989   doesn't think they used mesh   Social History:  reports that she quit smoking about 37 years ago. Her smoking use included cigarettes and cigars. She started smoking about 38 years ago. She has a 0.5 pack-year smoking history. She has never used smokeless tobacco. She reports that she does not drink alcohol and does not use drugs.  Allergies  Allergen Reactions   Hydrocodone Shortness Of Breath, Dermatitis and Other (See Comments)    I forget to breathe.   Clopidogrel Rash and Dermatitis    Family History  Problem Relation Age of Onset   Coronary artery disease Mother        in her mid 56s   Hypertension Mother    Cancer Mother        skin   Heart attack Father        in his mid 38s   COPD Father    Stroke Sister    Coronary artery disease Sister    Diabetes Sister    Other Half-Sister    Thyroid  cancer Daughter    Prostate cancer Neg Hx    Colon cancer Neg Hx    Breast cancer Neg Hx    Endometrial cancer Neg Hx    Ovarian cancer Neg Hx    Pancreatic cancer Neg Hx     Prior to Admission medications   Medication Sig  Start Date End Date Taking? Authorizing Provider  acetaminophen  (TYLENOL ) 500 MG tablet Take 1-2 tablets (500-1,000 mg total) by mouth every 8 (eight) hours as needed for mild pain (pain score 1-3) or moderate pain (pain score 4-6). 03/30/24 03/30/25 Yes Hongalgi, Anand D, MD  apixaban (ELIQUIS) 5 MG TABS tablet Take 1 tablet (5 mg total) by mouth 2 (two) times daily. 06/11/24  Yes Jerilynn Daphne SAILOR, NP  atorvastatin  (LIPITOR ) 80 MG tablet Take 1 tablet (80 mg total) by mouth every evening. 06/11/24  Yes Jerilynn Daphne SAILOR, NP  Cholecalciferol  (VITAMIN D3 SUPER STRENGTH) 50 MCG (2000 UT) CAPS Take 2,000 Units by mouth in the morning.   Yes [provider]  FIASP  FLEXTOUCH 100 UNIT/ML FlexTouch Pen Inject 5 Units into the skin with breakfast, with lunch, and with evening meal. 12/08/22  Yes [provider]  FLUoxetine  (PROZAC ) 40 MG capsule Take 1 capsule (40 mg total) by mouth daily. 06/11/24  Yes Jerilynn Daphne SAILOR, NP  guaiFENesin  (MUCINEX ) 600 MG 12 hr tablet Take 1 tablet (600 mg total) by mouth 2 (two) times daily as needed for cough or to loosen phlegm. 05/05/24  Yes Caleen Qualia, MD  insulin  degludec (TRESIBA) 100 UNIT/ML FlexTouch Pen Inject 16 Units into the skin every evening.   Yes [provider]  insulin  regular (NOVOLIN  R) 100 units/mL injection Inject 5 Units into the skin 3 (three) times daily before meals.   Yes [provider]  lactulose  (CHRONULAC ) 10 GM/15ML solution Take 45 mLs (30 g total) by mouth 2 (two) times daily as needed for mild constipation or moderate constipation. 05/05/24  Yes Caleen Qualia, MD  lidocaine  (LIDODERM ) 5 % Place 1 patch onto the skin daily. 04/27/24  Yes [provider]  metFORMIN (GLUCOPHAGE-XR) 500 MG 24 hr tablet Take 2 tablets (1,000 mg total) by mouth 2 (two) times daily with a meal. 06/11/24  Yes Jerilynn Daphne SAILOR, NP  Misc Natural Products (NEURIVA PO) Take 1 tablet by mouth daily.   Yes [provider]   Misc. Devices MISC Portable oxygen  concentrator, 2L oxygen .  Diagnoses-chronic respiratory failure with hypoxia 11/27/21  Yes Newlin, Enobong, MD  nitroGLYCERIN  (NITROSTAT ) 0.4 MG SL tablet Dissolve 1 tablet under the tongue every 5 minutes as needed for chest pain. Max of 3 doses, then 911. 01/25/23  Yes Weaver, Scott T, PA-C  nystatin cream (MYCOSTATIN) Apply topically 2 (two) times daily. Patient taking differently: Apply topically 2 (two) times daily as needed for dry skin (yeast). 06/11/24  Yes Jerilynn Daphne SAILOR, NP  ondansetron  (ZOFRAN ) 8 MG tablet Take 1 tablet (8 mg total) by mouth every 8 (eight) hours as needed for nausea or vomiting. Start on the third day after carboplatin . 06/11/24  Yes Jerilynn Daphne SAILOR, NP  Oxycodone  HCl 10 MG TABS Take 1 tablet (10 mg total) by mouth every 6 (six) hours as needed. Patient taking differently: Take 10 mg by mouth every 6 (six) hours as needed (pain). 05/28/24  Yes Gorsuch, Ni, MD  polyethylene glycol powder (GLYCOLAX /MIRALAX ) 17 GM/SCOOP powder Dissolve 1 capful (17g) in 4-8 ounces of liquid and take by mouth daily as needed for mild constipation. 06/11/24  Yes Jerilynn Daphne SAILOR, NP  pregabalin  (LYRICA ) 50 MG capsule Take 1 capsule (50 mg total) by mouth 3 (three) times daily. 06/11/24  Yes Jerilynn Daphne SAILOR, NP  senna-docusate (SENOKOT-S) 8.6-50 MG tablet Take 1-2 tablets by mouth 2 (two) times daily between meals as needed for mild constipation or moderate constipation. Patient taking differently: Take 1-2 tablets by mouth as needed for mild constipation or moderate constipation. 04/24/24  Yes Gonfa, Taye T, MD  buPROPion  (WELLBUTRIN  XL) 150 MG 24 hr tablet Take 150 mg by mouth in the morning.    [provider]  losartan  (COZAAR ) 25 MG tablet Take 1 tablet (25 mg total) by mouth daily. 07/19/23   Wonda Sharper, MD    Physical Exam: Vitals:   06/19/24 0941 06/19/24 0946 06/19/24 1413 06/19/24 1656  BP: (!) 141/65 125/72 (!) 129/49   Pulse:  92 93 76   Resp: 18 18 (!) 25   Temp: 98.6 F (37 C) 97.7 F (36.5 C)    SpO2: 92% 97% 100%   Weight:    83.2 kg  Height:    5' 2 (1.575 m)   General exam: awake, alert, no acute distress,  chronically ill appearing HEENT: atraumatic, clear conjunctiva, anicteric sclera, moist mucus membranes, hearing grossly normal  Respiratory system: CTAB, no wheezes, rales or rhonchi, normal respiratory effort. Cardiovascular system: normal S1/S2, RRR, no JVD, murmurs, rubs, gallops, no pedal edema.  Port present in right upper chest. Gastrointestinal system: soft, suprapubic tenderness, otherwise non-tender, +bowel sounds. Central nervous system: A&O x3.  normal speech, grossly non-focal but vision deficits apparent by gaze  Extremities: moves all, no edema, normal tone Skin: dry, intact, normal temperature Psychiatry: normal mood, congruent affect, judgement and insight appear normal   Data Reviewed:  As reviewed in detail above  Assessment and Plan:  UTI -- presenting with confusion, suprapubic tenderness Hx of UTI's --Continue IV Rocephin  --Follow urine culture  Acute PE, RLL -- incidental finding on CT A/P, in setting of Eliquis compliance for recent stroke.  Suspect Eliquis failure due to hypercoaguable state with malignancy. Splenic infarct  --Heparin  drip for now --Hold Eliquis --Consult Heme/Oncology tomorrow for anticoagulation recommendations --U/S venous doppler lower extremities  Recent CVA with residual vision deficits and mild aphasia, intermittent confusion Head CT showed expected evolution of her recent stroke, no new infarcts. Her residual stroke symptoms seem to be having some recrudence in setting of acute illness.  --On heparin , hold Eliquis --Continue Lipitor  --Monitor neuro status  Type 2 diabetes, insulin -dependent, with hyperglycemia Last A1c 7.2% --Sliding scale Novolog  --Resume basal at home dose 16 units QHS --CBG's & hypoglycemia protocol --Titrate  regimen  Metastatic endometrial cancer -- on active chemo. Follows with Dr. Lonn --Continue home pain regimen --IV morphine  for breakthrough pain  Hx of CAD - sp PCI in 2021.  Stable, no chest pain Chronic HFpEF --Effient  was dc'd for Eliquis in setting of her stroke in October --Monitor volume status --EKG and troponin if chest pain --Continue Lipitor  --Heparin  as above for now  Chronic respiratory failure with hypoxia - stable on baseline 2 L/min O2 --Continue O2  OSA -- nightly CPAP ordered  Depression - continue Prozac .  Wellbutrin  has been on hold since prior d/c, will hold.     Advance Care Planning: Code status - full code  Consults: None. Contact Heme/oncology tomorrow  Family Communication: Husband at bedside during admission encounter  Severity of Illness: The appropriate patient status for this patient is INPATIENT. Inpatient status is judged to be reasonable and necessary in order to provide the required intensity of service to ensure the patient's safety. The patient's presenting symptoms, physical exam findings, and initial radiographic and laboratory data in the context of their chronic comorbidities is felt to place them at high risk for further clinical deterioration. Furthermore, it is not anticipated that the patient will be medically stable for discharge from the hospital within 2 midnights of admission.   * I certify that at the point of admission it is my clinical judgment that the patient will require inpatient hospital care spanning beyond 2 midnights from the point of admission due to high intensity of service, high risk for further deterioration and high frequency of surveillance required.*  Author: Burnard DELENA Cunning, DO 06/19/2024 4:58 PM  For on call review [email or web address].

## 2024-06-19 NOTE — ED Provider Notes (Signed)
 [place] EMERGENCY DEPARTMENT AT [facility] Provider Note   CSN: [id number] Arrival date & time: 06/19/24  9061     Patient presents with: Altered Mental Status   Brenda [name] is a 63 y.o. female patient with past medical history of hypertension, hyperlipidemia, diabetes, CVA on Eliquis, metastatic neoplasm with metastasis, CHF presents to emergency room with complaint of confusion.  Patient reports that she has been confused for the past 2 weeks, having difficulty finding her words and problems with left leg weakness since she had a stroke.  Yesterday she started having mild anterior headache and felt more confused.  Family ember states that she has had some difficulty knowing where she is or what time it is.  She has had some difficulty identifying family members.  She does not note any acute change in vision, or new deficit.    Altered Mental Status      Prior to Admission medications   Medication Sig Start Date End Date Taking? Authorizing Provider  acetaminophen  (TYLENOL ) 500 MG tablet Take 1-2 tablets (500-1,000 mg total) by mouth every 8 (eight) hours as needed for mild pain (pain score 1-3) or moderate pain (pain score 4-6). 03/30/24 03/30/25  Hongalgi, Anand D, MD  apixaban (ELIQUIS) 5 MG TABS tablet Take 1 tablet (5 mg total) by mouth 2 (two) times daily. 06/11/24   Jerilynn Daphne SAILOR, NP  atorvastatin  (LIPITOR ) 80 MG tablet Take 1 tablet (80 mg total) by mouth every evening. 06/11/24   Jerilynn Daphne SAILOR, NP  buPROPion  (WELLBUTRIN  XL) 150 MG 24 hr tablet Take 150 mg by mouth in the morning.    [provider]  Cholecalciferol  (VITAMIN D3 SUPER STRENGTH) 50 MCG (2000 UT) CAPS Take 2,000 Units by mouth in the morning.    [provider]  FIASP  FLEXTOUCH 100 UNIT/ML FlexTouch Pen Inject 5 Units into the skin with breakfast, with lunch, and with evening meal. 12/08/22   [provider]  FLUoxetine  (PROZAC ) 40 MG capsule Take 1 capsule (40 mg  total) by mouth daily. 06/11/24   Jerilynn Daphne SAILOR, NP  guaiFENesin  (MUCINEX ) 600 MG 12 hr tablet Take 1 tablet (600 mg total) by mouth 2 (two) times daily as needed for cough or to loosen phlegm. 05/05/24   Amin, Sumayya, MD  insulin  degludec (TRESIBA) 100 UNIT/ML FlexTouch Pen Inject 24 Units into the skin every evening.    [provider]  lactulose  (CHRONULAC ) 10 GM/15ML solution Take 45 mLs (30 g total) by mouth 2 (two) times daily as needed for mild constipation or moderate constipation. 05/05/24   Caleen Qualia, MD  lidocaine  (LIDODERM ) 5 % Place 1 patch onto the skin daily. 04/27/24   [provider]  losartan  (COZAAR ) 25 MG tablet Take 1 tablet (25 mg total) by mouth daily. 07/19/23   Cooper, Michael, MD  metFORMIN (GLUCOPHAGE-XR) 500 MG 24 hr tablet Take 2 tablets (1,000 mg total) by mouth 2 (two) times daily with a meal. 06/11/24   Jerilynn Daphne SAILOR, NP  Misc Natural Products (NEURIVA PO) Take 1 tablet by mouth daily.    [provider]  Misc. Devices MISC Portable oxygen  concentrator, 2L oxygen .  Diagnoses-chronic respiratory failure with hypoxia 11/27/21   Newlin, Enobong, MD  nitroGLYCERIN  (NITROSTAT ) 0.4 MG SL tablet Dissolve 1 tablet under the tongue every 5 minutes as needed for chest pain. Max of 3 doses, then 911. Patient not taking: Reported on 06/02/2024 01/25/23   Lelon Hamilton T, PA-C  nystatin cream (MYCOSTATIN)  Apply topically 2 (two) times daily. 06/11/24   Jerilynn Daphne SAILOR, NP  ondansetron  (ZOFRAN ) 8 MG tablet Take 1 tablet (8 mg total) by mouth every 8 (eight) hours as needed for nausea or vomiting. Start on the third day after carboplatin . 06/11/24   Jerilynn Daphne SAILOR, NP  Oxycodone  HCl 10 MG TABS Take 1 tablet (10 mg total) by mouth every 6 (six) hours as needed. Patient taking differently: Take 10 mg by mouth every 6 (six) hours as needed (pain). 05/28/24   Lonn Hicks, MD  OXYGEN  Inhale 2 L/min into the lungs continuous.    [provider]   polyethylene glycol powder (GLYCOLAX /MIRALAX ) 17 GM/SCOOP powder Dissolve 1 capful (17g) in 4-8 ounces of liquid and take by mouth daily as needed for mild constipation. 06/11/24   Jerilynn Daphne SAILOR, NP  pregabalin  (LYRICA ) 50 MG capsule Take 1 capsule (50 mg total) by mouth 3 (three) times daily. 06/11/24   Jerilynn Daphne SAILOR, NP  senna-docusate (SENOKOT-S) 8.6-50 MG tablet Take 1-2 tablets by mouth 2 (two) times daily between meals as needed for mild constipation or moderate constipation. Patient taking differently: Take 1-2 tablets by mouth as needed for mild constipation or moderate constipation. 04/24/24   Gonfa, Taye T, MD    Allergies: Hydrocodone and Clopidogrel    Review of Systems  Musculoskeletal:  Positive for arthralgias.    Updated Vital Signs BP (!) 129/49 (BP Location: Right Arm)   Pulse 76   Temp 97.7 F (36.5 C)   Resp (!) 25   LMP 10/30/2014 (Approximate)   SpO2 100%   Physical Exam Vitals and nursing note reviewed.  Constitutional:      General: She is not in acute distress.    Appearance: She is not toxic-appearing.  HENT:     Head: Normocephalic and atraumatic.  Eyes:     General: No scleral icterus.    Conjunctiva/sclera: Conjunctivae normal.  Cardiovascular:     Rate and Rhythm: Normal rate and regular rhythm.     Pulses: Normal pulses.     Heart sounds: Normal heart sounds.  Pulmonary:     Effort: Pulmonary effort is normal. No respiratory distress.     Breath sounds: Normal breath sounds.  Abdominal:     General: Abdomen is flat. Bowel sounds are normal.     Palpations: Abdomen is soft.     Tenderness: There is no abdominal tenderness.     Comments: Significant tenderness to palpation over suprapubic area. Rash consistent with candidiasis in gluteal fold.  Skin:    General: Skin is warm and dry.     Findings: No lesion.  Neurological:     General: No focal deficit present.     Mental Status: She is alert and oriented to person, place, and  time. Mental status is at baseline.     Comments: Left pupil is dilated which patient reports is from last stroke.  Denies any acute vision changes.  No nystagmus.  No noted facial droop or slurred speech Upper extremity sensation and strength intact.  No ataxia with finger-to-nose Lower extremity sensation and strength intact.     (all labs ordered are listed, but only abnormal results are displayed) Labs Reviewed  COMPREHENSIVE METABOLIC PANEL WITH GFR - Abnormal; Notable for the following components:      Result Value   Sodium 131 (*)    Chloride 94 (*)    CO2 21 (*)    Glucose, Bld 236 (*)    Albumin 2.9 (*)  Anion gap 16 (*)    All other components within normal limits  CBC - Abnormal; Notable for the following components:   WBC 11.6 (*)    RBC 3.74 (*)    Hemoglobin 10.1 (*)    HCT 32.0 (*)    RDW 15.8 (*)    All other components within normal limits  URINALYSIS, ROUTINE W REFLEX MICROSCOPIC - Abnormal; Notable for the following components:   Color, Urine AMBER (*)    APPearance CLOUDY (*)    Ketones, ur 5 (*)    Protein, ur 30 (*)    Leukocytes,Ua MODERATE (*)    Bacteria, UA MANY (*)    Non Squamous Epithelial 0-5 (*)    All other components within normal limits  CBG MONITORING, ED - Abnormal; Notable for the following components:   Glucose-Capillary 238 (*)    All other components within normal limits  URINE CULTURE    EKG: EKG Interpretation Date/Time:  Friday June 19 2024 14:07:18 EDT Ventricular Rate:  76 PR Interval:  176 QRS Duration:  143 QT Interval:  415 QTC Calculation: 467 R Axis:   -84  Text Interpretation: Sinus rhythm RBBB and LAFB Confirmed by Darra Chew [id number] on 06/19/2024 2:14:41 PM  Radiology: No results found.   Procedures   Medications Ordered in the ED  sodium chloride  0.9 % bolus 500 mL (has no administration in time range)  ondansetron  (ZOFRAN ) injection 4 mg (has no administration in time range)  morphine  (PF) 4  MG/ML injection 4 mg (has no administration in time range)  cefTRIAXone  (ROCEPHIN ) 1 g in sodium chloride  0.9 % 100 mL IVPB (has no administration in time range)  insulin  aspart (novoLOG ) injection 5 Units (has no administration in time range)    Clinical Course as of 06/19/24 1535  Fri Jun 19, 2024  1521 Recent CVA, Met CA. Dc on Eliquis. Worsening confusion. Chronic unequal pupils. Admit after imaging [BH]    Clinical Course User Index [BH] Henderly, Britni A, PA-C                                 Medical Decision Making Amount and/or Complexity of Data Reviewed Labs: ordered. Radiology: ordered.  Risk Prescription drug management.   This patient presents to the ED for concern of AMS, this involves an extensive number of treatment options, and is a complaint that carries with it a high risk of complications and morbidity.  The differential diagnosis includes intercranial metastasis, CVA, dehydration, electrolyte abnormality, infection including URI, pneumonia or UTI   Co morbidities that complicate the patient evaluation  History of metastasis Recent CVA   Additional history obtained:  Additional history obtained from I reviewed patient's discharge summary 06/12/2024 after her CVA admission patient's CVA was thought to be due to hypercoagulable state from cancer   Lab Tests:  I personally interpreted labs.  The pertinent results include:   Mild hyponatremia at 131, anion gap of 16 Mild leukocytosis and CBC UA positive for leukocytes with many bacteria will send for culture and give Rocephin    Imaging Studies ordered:  I ordered imaging studies including CT scan of head, CT abdomen pelvis with contrast Pending at time of sign out.    Cardiac Monitoring: / EKG:  The patient was maintained on a cardiac monitor.  I personally viewed and interpreted the cardiac monitored which showed an underlying rhythm of: Sinus rhythm with right bundle branch block.  Problem  List / ED Course / Critical interventions / Medication management  Patient reports to emergency room with complaint of 2 days of worsening confusion, worsening difficulty finding words and complaint of abdominal pain.  Of note patient had recent similar presentation and had MRI showing CVA.  Today she has no new focal neurological deficits.  Her lungs are clear to auscultation.  She does have pretty significant tenderness in suprapubic area.  She denies any dysuria or frequency or urgency.  Given her acute change in mental status and headache I will obtain CT scan of her head.  I will obtain CT abdomen pelvis due to her significant abdominal pain.  Her urine does show urinary tract infection so I will give her 1 dose of Rocephin  here.  She is also requesting pain control which I will give morphine , Zofran  and normal saline bolus and reassess Reevaluation of the patient after these medicines showed that the patient improved I have reviewed the patients home medicines and have made adjustments as needed. Patient signed out to oncoming ED PA-C at shift change, I anticipate the need for admission due to change in mental status.         Final diagnoses:  None    ED Discharge Orders     None          Shermon Warren LOISE DEVONNA 06/19/24 1535    Long, Fonda MATSU, MD 06/20/24 [phone]

## 2024-06-19 NOTE — Progress Notes (Signed)
 ANTICOAGULATION CONSULT NOTE  Pharmacy Consult for Heparin  Indication: Recent CVA 9/27. New PE and splenic infarct today. On Eliquis last dose today  Allergies  Allergen Reactions   Hydrocodone Shortness Of Breath, Dermatitis and Other (See Comments)    I forget to breathe.   Clopidogrel Rash and Dermatitis    Patient Measurements:   Heparin  Dosing Weight: 68.8 kg  Vital Signs: Temp: 97.7 F (36.5 C) (10/17 0946) BP: 129/49 (10/17 1413) Pulse Rate: 76 (10/17 1413)  Labs: Recent Labs    06/19/24 1004  HGB 10.1*  HCT 32.0*  PLT 310  CREATININE 0.64    Estimated Creatinine Clearance: 73.8 mL/min (by C-G formula based on SCr of 0.64 mg/dL).   Medical History: Past Medical History:  Diagnosis Date   Anemia    Arthritis    back, hands, hips (02/22/2015)   CAD (coronary artery disease)    a. complex LAD/diagonal bifurcation PCI in 2010. b. STEMI 10/2019 s/p PTCA/DES x1 to mLAD overlapping the old stent, residual disease treated medicaly.   Cancer [facility])    uterine   CHF (congestive heart failure) (HCC)    Depression    Diabetes mellitus (HCC)    started when I was pregnant; not sure if it was type 1 or type 2    History of radiation therapy    Endometrium- HDR 01/22/22-03/07/22- Dr. Lynwood Nasuti   Hypercholesterolemia    Hypertension    Morbid obesity (HCC)    Myocardial infarction (HCC)    mild x 3   Neuromuscular disorder (HCC)    neuropathy feet   Sleep apnea    cpap/    Medications:  (Not in a hospital admission)  Scheduled:   insulin  aspart  5 Units Intravenous Once   Infusions:   cefTRIAXone  (ROCEPHIN )  IV 1 g (06/19/24 1651)   PRN:   Assessment: 63 yof with a history of HTN, HLD, DM, CVA, metastatic neoplasm w/ metastasis, HF. Patient is presenting with confusion. Heparin  per pharmacy consult placed for Recent CVA 9/27. New PE and splenic infarct today. On Eliquis last dose today. Will require aPTT monitoring due to likely falsely high  anti-Xa level secondary to DOAC use.  Hgb 10.1; plt 310  Goal of Therapy:  Heparin  level 0.3-0.7 units/ml aPTT 66-102 seconds Monitor platelets by anticoagulation protocol: Yes   Plan:  No initial heparin  bolus Start heparin  infusion at 1250 units/hr Check aPTT & anti-Xa level in 6 hours and daily while on heparin  Continue to monitor via aPTT until levels are correlated Continue to monitor H&H and platelets  Dorn Buttner, PharmD, BCPS 06/19/2024 4:56 PM ED Clinical Pharmacist -  [phone]

## 2024-06-20 ENCOUNTER — Inpatient Hospital Stay (HOSPITAL_COMMUNITY)

## 2024-06-20 ENCOUNTER — Encounter (HOSPITAL_COMMUNITY): Payer: Self-pay | Admitting: Internal Medicine

## 2024-06-20 DIAGNOSIS — G9341 Metabolic encephalopathy: Secondary | ICD-10-CM | POA: Diagnosis not present

## 2024-06-20 LAB — CBC
HCT: 29.6 % — ABNORMAL LOW (ref 36.0–46.0)
Hemoglobin: 9.2 g/dL — ABNORMAL LOW (ref 12.0–15.0)
MCH: 26.9 pg (ref 26.0–34.0)
MCHC: 31.1 g/dL (ref 30.0–36.0)
MCV: 86.5 fL (ref 80.0–100.0)
Platelets: 284 K/uL (ref 150–400)
RBC: 3.42 MIL/uL — ABNORMAL LOW (ref 3.87–5.11)
RDW: 16 % — ABNORMAL HIGH (ref 11.5–15.5)
WBC: 10.6 K/uL — ABNORMAL HIGH (ref 4.0–10.5)
nRBC: 0 % (ref 0.0–0.2)

## 2024-06-20 LAB — BASIC METABOLIC PANEL WITH GFR
Anion gap: 13 (ref 5–15)
BUN: 7 mg/dL — ABNORMAL LOW (ref 8–23)
CO2: 23 mmol/L (ref 22–32)
Calcium: 8.7 mg/dL — ABNORMAL LOW (ref 8.9–10.3)
Chloride: 98 mmol/L (ref 98–111)
Creatinine, Ser: 0.57 mg/dL (ref 0.44–1.00)
GFR, Estimated: >60 mL/min (ref >60)
Glucose, Bld: 125 mg/dL — ABNORMAL HIGH (ref 70–99)
Potassium: 3.9 mmol/L (ref 3.5–5.1)
Sodium: 134 mmol/L — ABNORMAL LOW (ref 135–145)

## 2024-06-20 LAB — APTT
aPTT: 55 s — ABNORMAL HIGH (ref 24–36)
aPTT: 65 s — ABNORMAL HIGH (ref 24–36)
aPTT: 68 s — ABNORMAL HIGH (ref 24–36)

## 2024-06-20 LAB — HEPARIN LEVEL (UNFRACTIONATED)
Heparin Unfractionated: >1.1 [IU]/mL — ABNORMAL HIGH (ref 0.30–0.70)
Heparin Unfractionated: >1.1 [IU]/mL — ABNORMAL HIGH (ref 0.30–0.70)

## 2024-06-20 LAB — GLUCOSE, CAPILLARY
Glucose-Capillary: 178 mg/dL — ABNORMAL HIGH (ref 70–99)
Glucose-Capillary: 197 mg/dL — ABNORMAL HIGH (ref 70–99)
Glucose-Capillary: 292 mg/dL — ABNORMAL HIGH (ref 70–99)
Glucose-Capillary: 340 mg/dL — ABNORMAL HIGH (ref 70–99)

## 2024-06-20 LAB — MAGNESIUM: Magnesium: 1 mg/dL — ABNORMAL LOW (ref 1.7–2.4)

## 2024-06-20 LAB — PHOSPHORUS: Phosphorus: 3.9 mg/dL (ref 2.5–4.6)

## 2024-06-20 MED ORDER — CHLORHEXIDINE GLUCONATE CLOTH 2 % EX PADS
6.0000 | MEDICATED_PAD | Freq: Every day | CUTANEOUS | Status: DC
  Administered 2024-06-20 – 2024-06-22 (×3): 6 via TOPICAL

## 2024-06-20 MED ORDER — SODIUM CHLORIDE 0.9% FLUSH
10.0000 mL | Freq: Two times a day (BID) | INTRAVENOUS | Status: DC
  Administered 2024-06-20 – 2024-06-22 (×5): 10 mL

## 2024-06-20 MED ORDER — SODIUM CHLORIDE 0.9% FLUSH: 10.0000 mL | INTRAVENOUS | Status: DC | PRN

## 2024-06-20 MED ORDER — MIRTAZAPINE 15 MG PO TBDP
15.0000 mg | ORAL_TABLET | Freq: Every day | ORAL | Status: DC
  Administered 2024-06-20 – 2024-06-21 (×2): 15 mg via ORAL
  Filled 2024-06-20 (×2): qty 1

## 2024-06-20 MED ORDER — GLUCERNA SHAKE PO LIQD
237.0000 mL | Freq: Three times a day (TID) | ORAL | Status: DC
  Administered 2024-06-20 – 2024-06-22 (×5): 237 mL via ORAL

## 2024-06-20 MED ORDER — HYDROXYZINE HCL 25 MG PO TABS
25.0000 mg | ORAL_TABLET | ORAL | Status: AC
  Administered 2024-06-20: 25 mg via ORAL
  Filled 2024-06-20: qty 1

## 2024-06-20 MED ORDER — HYDROXYZINE HCL 10 MG PO TABS
10.0000 mg | ORAL_TABLET | Freq: Three times a day (TID) | ORAL | Status: DC | PRN
  Administered 2024-06-20 – 2024-06-21 (×3): 10 mg via ORAL
  Filled 2024-06-20 (×3): qty 1

## 2024-06-20 NOTE — Progress Notes (Signed)
 PROGRESS NOTE    Brenda [name]  FMW:[id number] DOB: 12-Nov-1960 DOA: 06/19/2024 PCP: [facility]   Chief Complaint  Patient presents with   Altered Mental Status    Brief Narrative:    Brenda [name] is a 63 y.o. 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   Assessment & Plan:   Principal Problem:   Acute metabolic encephalopathy    Acute metabolic encephalopathy -Very likely in the setting of UTI. - As well in the setting of recent CVA -Mentation has improved. - CT head with no acute findings.  UTI -Reports polyuria, has suprapubic tenderness, positive UA -Continue with IV Rocephin  -Follow on urine cultures    Acute PE, RLL  - incidental finding on CT A/P - Has diagnosis of acute DVT during recent hospitalization -Remains on heparin  drip, she is with known hypercoagulable status with malignancy during recent hospitalization started on Eliquis, difficult to tell if this is Eliquis failure or something she had during previous admission, discussed with hematology/oncology, they will evaluate patient in a.m.SABRA  Splenic infarct  -Heparin , continue with heparin  drip . - Splenic infarct was not present on previous CT abdomen pelvis 05/04/2024 . - Will await further recommendations from hematology in a.m.   Recent CVA with residual vision deficits and mild aphasia, intermittent confusion - Head CT showed  expected evolution of her recent stroke, no new infarcts. - Her residual stroke symptoms seem to be having some recrudence in setting of acute illness.  --On heparin , hold Eliquis --Continue Lipitor  --Monitor neuro status   Type 2 diabetes, insulin -dependent, with hyperglycemia Last A1c 7.2% --Sliding scale Novolog  --Resume basal at home dose 16 units QHS --CBG's & hypoglycemia protocol --Titrate regimen   Metastatic endometrial cancer  -  on active chemo. Follows with Dr. Lonn --Continue home pain regimen --IV morphine  for breakthrough pain   Hx of CAD  - sp PCI in 2021.  Stable, no chest pain  Chronic HFpEF --Effient  was dc'd for Eliquis in setting of her stroke in October --Monitor volume status --EKG and troponin if chest pain --Continue Lipitor  --Heparin  as above for now   Chronic respiratory failure with hypoxia - stable on baseline 2 L/min O2 --Continue O2   OSA -- nightly CPAP ordered   Depression  - continue Prozac .  Wellbutrin  has been on hold since prior d/c, will hold.      DVT prophylaxis: (Heparin  GTT Code Status: (Full) Family Communication: (Husband at bedside) Disposition:   Status is: Inpatient    Consultants:  Hematology/oncology  Subjective:  Patient denies any new focal deficits  Objective: Vitals:   06/20/24 0425 06/20/24 0738 06/20/24 0800 06/20/24 1125  BP: (!) 140/60 (!) 153/62 (!) 146/64 126/74  Pulse: 79 71 68 71  Resp:  19 13 (!) 23  Temp: 98.4 F (36.9 C) 98.2 F (36.8 C)  98 F (36.7 C)  TempSrc: Axillary Axillary  Oral  SpO2:  98% 98%   Weight:      Height:  No intake or output data in the 24 hours ending 06/20/24 1305 Filed Weights   06/19/24 1656 06/19/24 2101  Weight: 83.2 kg 81 kg    Examination:  Awake Alert, Oriented X 3, with alopecia due to chemotherapy Symmetrical Chest wall movement, Good air movement bilaterally RRR,No Gallops,Rubs or new Murmurs, No Parasternal Heave +ve B.Sounds, Abd  Soft, No tenderness No Cyanosis, Clubbing or edema, No new Rash or bruise      Data Reviewed: I have personally reviewed following labs and imaging studies  CBC: Recent Labs  Lab 06/19/24 1004 06/20/24 0115  WBC 11.6* 10.6*  HGB 10.1* 9.2*  HCT 32.0* 29.6*  MCV 85.6 86.5  PLT 310 284    Basic Metabolic Panel: Recent Labs  Lab 06/19/24 1004 06/20/24 0115  NA 131* 134*  K 3.9 3.9  CL 94* 98  CO2 21* 23  GLUCOSE 236* 125*  BUN 9 7*  CREATININE 0.64 0.57  CALCIUM  9.1 8.7*    GFR: Estimated Creatinine Clearance: 72.9 mL/min (by C-G formula based on SCr of 0.57 mg/dL).  Liver Function Tests: Recent Labs  Lab 06/19/24 1004  AST 15  ALT 10  ALKPHOS 77  BILITOT 0.6  PROT 6.9  ALBUMIN 2.9*    CBG: Recent Labs  Lab 06/19/24 1859 06/19/24 1929 06/19/24 2129 06/20/24 0743 06/20/24 1124  GLUCAP 120* 122* 106* 178* 197*     No results found for this or any previous visit (from the past 240 hours).       Radiology Studies: CT ABDOMEN PELVIS W CONTRAST Result Date: 06/19/2024 CLINICAL DATA:  Abdominal pain, acute, nonlocalized EXAM: CT ABDOMEN AND PELVIS WITH CONTRAST TECHNIQUE: Multidetector CT imaging of the abdomen and pelvis was performed using the standard protocol following bolus administration of intravenous contrast. RADIATION DOSE REDUCTION: This exam was performed according to the departmental dose-optimization program which includes automated exposure control, adjustment of the mA and/or kV according to patient size and/or use of iterative reconstruction technique. CONTRAST:  75mL OMNIPAQUE  IOHEXOL  350 MG/ML SOLN COMPARISON:  05/04/2024 FINDINGS: Lower chest: No focal airspace consolidation or pleural effusion.Incidentally noted, proximal segmental pulmonary emboli in the right lower lobe. Dense atherosclerosis and possible stenting of the LAD. Hepatobiliary: No mass.Cholecystectomy. Mild dilation of the intrahepatic and extrahepatic bile ducts, likely  related to the prior cholecystectomy. The portal veins are patent. Pancreas: Diffuse fatty atrophy of the pancreatic parenchyma. No mass or ductal dilation. No peripancreatic inflammation or fluid collection. Spleen: Wedge-shaped region of hypoattenuation in the posteromedial aspect of the spleen, measuring up to 1.7 cm, likely splenic infarct. Adrenals/Urinary Tract: No adrenal masses. Similar mass effect and anterior displacement of the left kidney. No renal mass. No nephrolithiasis or hydronephrosis. Partially distended urinary bladder without visualized abnormality. Stomach/Bowel: The stomach is decompressed without focal abnormality. 2.1 cm periampullary duodenum diverticulum. No small bowel wall thickening or inflammation. No small bowel obstruction.Normal appendix. Vascular/Lymphatic: No aortic aneurysm. Scattered aortoiliac atherosclerosis. Subcentimeter paraesophageal lymph node along the right ward aspect of the distal esophagus measuring 7 mm, unchanged. In the posteromedial, left pararenal space, there is a large centrally necrotic mass with peripheral enhancement measuring 8.5 x 7.5 x 10.2 cm (APxTRxCC), mostly centered within the left psoas musculature, causing significant mass effect on the posteromedial aspect of the left kidney. The mass at least abuts the posterolateral wall of the infrarenal aorta and the left renal collecting system at the UPJ and proximal ureteral segment. Subtle erosive changes of the leftward anterior aspect of the L2 vertebral  body (axial 46), unchanged. Interval decrease in size of the left external iliac chain lymph node (axial 73), now measuring 0.8 cm (previously 1.8 cm). Reproductive: Hysterectomy. No concerning adnexal mass.No free pelvic fluid. Other: No pneumoperitoneum or ascites.  Mild anasarca. Musculoskeletal: No acute fracture or destructive lesion. Multilevel degenerative disc disease of the spine. IMPRESSION: 1. Incidentally noted proximal segmental pulmonary  emboli in the right lower lobe. No findings to suggest right heart strain. 2. Small wedge-shaped region of hypoattenuation in the posteromedial aspect of the spleen measuring up to 1.7 cm, consistent with a splenic infarct. 3. Overall, similar size and appearance of the centrally necrotic, infiltrative mass in the left psoas muscle, measuring 8.5 x 7.5 x 10.2 cm. The mass abuts the left renal collecting system, without associated hydronephrosis. 4. Decrease in size of the left external iliac chain lymph node (axial 73), now measuring 0.8 cm (previously, 1.8 cm). Critical Value/emergent results were called by telephone at the time of interpretation on 06/19/2024 at 4:09 pm to provider [facility], who verbally acknowledged these results. Aortic Atherosclerosis (ICD10-I70.0). Electronically Signed   By: Rogelia Myers M.D.   On: 06/19/2024 16:15   CT Head Wo Contrast Result Date: 06/19/2024 CLINICAL DATA:  Recent stroke, increased confusion EXAM: CT HEAD WITHOUT CONTRAST TECHNIQUE: Contiguous axial images were obtained from the base of the skull through the vertex without intravenous contrast. RADIATION DOSE REDUCTION: This exam was performed according to the departmental dose-optimization program which includes automated exposure control, adjustment of the mA and/or kV according to patient size and/or use of iterative reconstruction technique. COMPARISON:  MRI 05/31/2024 FINDINGS: CT HEAD: There is an evolving infarct in the right posterior and medial temporal lobe with some cortical mineralization. There is no hemorrhage. No new ischemic changes. No mass lesion. The ventricles are normal. Skull/sinuses/orbits: No significant abnormality. IMPRESSION: Evolving infarct in the right posterior and medial temporal lobe No new infarct. Electronically Signed   By: Nancyann Burns M.D.   On: 06/19/2024 16:00        Scheduled Meds:  atorvastatin   80 mg Oral QPM   Chlorhexidine  Gluconate Cloth  6 each  Topical Daily   FLUoxetine   40 mg Oral Daily   insulin  aspart  0-5 Units Subcutaneous QHS   insulin  aspart  0-9 Units Subcutaneous TID WC   insulin  glargine-yfgn  16 Units Subcutaneous QHS   lidocaine   1 patch Transdermal Q24H   sodium chloride  flush  10-40 mL Intracatheter Q12H   Continuous Infusions:  cefTRIAXone  (ROCEPHIN )  IV     heparin  1,400 Units/hr (06/20/24 1143)     LOS: 1 day     Brayton Lye, MD [facility]   To contact the attending provider between 7A-7P or the covering provider during after hours 7P-7A, please log into the web site [email or web address] and access using universal [place] password for that web site. If you do not have the password, please call the hospital operator.  06/20/2024, 1:05 PM

## 2024-06-20 NOTE — Plan of Care (Signed)
 Pt has rested quietly throughout the night with no distress noted. Alert and oriented but forgetful. ON [id number]. SR on the monitor. Up to [facility] to void. Medicated for pain once with relief noted. Medicated for anxiety once with relief noted. Wore cpap during the night. No other complaints voiced.     Problem: Tissue Perfusion: Goal: Adequacy of tissue perfusion will improve Outcome: Progressing   Problem: Clinical Measurements: Goal: Respiratory complications will improve Outcome: Progressing Goal: Cardiovascular complication will be avoided Outcome: Progressing   Problem: Activity: Goal: Risk for activity intolerance will decrease Outcome: Progressing   Problem: Coping: Goal: Level of anxiety will decrease Outcome: Progressing   Problem: Pain Managment: Goal: General experience of comfort will improve and/or be controlled Outcome: Progressing

## 2024-06-20 NOTE — Progress Notes (Addendum)
 ANTICOAGULATION CONSULT NOTE Pharmacy Consult for Heparin  Indication: CVA/PE/splenic infarct Brief A/P: aPTT subtherapeutic Increase Heparin  rate  Allergies  Allergen Reactions   Hydrocodone Shortness Of Breath, Dermatitis and Other (See Comments)    I forget to breathe.   Clopidogrel Rash and Dermatitis    Patient Measurements: Height: 5' 2 (157.5 cm) Weight: 81 kg (178 lb 9.2 oz) IBW/kg (Calculated) : 50.1 Heparin  Dosing Weight: 68.8 kg  Vital Signs: Temp: 98.2 F (36.8 C) (10/18 0738) Temp Source: Axillary (10/18 0738) BP: 153/62 (10/18 0738) Pulse Rate: 71 (10/18 0738)  Labs: Recent Labs    06/19/24 1004 06/19/24 1717 06/19/24 2111 06/20/24 0115  HGB 10.1*  --   --  9.2*  HCT 32.0*  --   --  29.6*  PLT 310  --   --  284  APTT  --  37*  --  55*  HEPARINUNFRC  --  >1.10*  --  >1.10*  CREATININE 0.64  --   --  0.57  TROPONINIHS  --  16 18*  --     Estimated Creatinine Clearance: 72.9 mL/min (by C-G formula based on SCr of 0.57 mg/dL).   Assessment: 63 y.o. 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 Heparin  level not ordered.  Hgb 9.2 and PLT 284. No infusion issues or signs of bleeding per RN.  Will increase heparin  slightly since pt is almost at goal  Goal of Therapy:  Heparin  level 0.3-0.7 units/ml aPTT 66-102 seconds Monitor platelets by anticoagulation protocol: Yes   Plan:  Increase heparin  infusion at 1450 units/hr  Check heparin  level and APTT in 6 hours and daily while on heparin   Continue to monitor H&H and platelets   Thank you for allowing pharmacy to be a part of this patient's care   Prentice DOROTHA Favors, PharmD PGY1 Health-System Pharmacy Administration and Leadership Resident [facility]  06/20/2024 2:02 PM

## 2024-06-20 NOTE — Progress Notes (Signed)
 Notified by husband that patient suddenly didn't recognize who he was any more despite being at the bedside all day. NIH performed and patient scored 1. Patient able to identify this nurse by name and answered other orientation questions appropriately. Dr. Sherlon notified and placed orders. Husband and patient updated on plans.

## 2024-06-20 NOTE — Progress Notes (Signed)
 ANTICOAGULATION CONSULT NOTE Pharmacy Consult for Heparin  Indication: CVA/PE/splenic infarct Brief A/P: aPTT subtherapeutic Increase Heparin  rate  Allergies  Allergen Reactions   Hydrocodone Shortness Of Breath, Dermatitis and Other (See Comments)    I forget to breathe.   Clopidogrel Rash and Dermatitis    Patient Measurements: Height: 5' 2 (157.5 cm) Weight: 81 kg (178 lb 9.2 oz) IBW/kg (Calculated) : 50.1 Heparin  Dosing Weight: 68.8 kg  Vital Signs: Temp: 98.2 F (36.8 C) (10/17 2101) Temp Source: Oral (10/18 0000) BP: 121/55 (10/18 0000) Pulse Rate: 72 (10/17 2334)  Labs: Recent Labs    06/19/24 1004 06/19/24 1717 06/19/24 2111 06/20/24 0115  HGB 10.1*  --   --  9.2*  HCT 32.0*  --   --  29.6*  PLT 310  --   --  284  APTT  --  37*  --  55*  HEPARINUNFRC  --  >1.10*  --  >1.10*  CREATININE 0.64  --   --  0.57  TROPONINIHS  --  16 18*  --     Estimated Creatinine Clearance: 72.9 mL/min (by C-G formula based on SCr of 0.57 mg/dL).   Assessment: 63 y.o. 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

## 2024-06-20 NOTE — Progress Notes (Signed)
 Patient not available at the moment , will be going to MRI shortly , will try back later as schedule allows.

## 2024-06-21 ENCOUNTER — Inpatient Hospital Stay (HOSPITAL_COMMUNITY)

## 2024-06-21 DIAGNOSIS — F05 Delirium due to known physiological condition: Secondary | ICD-10-CM | POA: Diagnosis not present

## 2024-06-21 DIAGNOSIS — G9341 Metabolic encephalopathy: Secondary | ICD-10-CM | POA: Diagnosis not present

## 2024-06-21 DIAGNOSIS — N390 Urinary tract infection, site not specified: Secondary | ICD-10-CM | POA: Diagnosis not present

## 2024-06-21 DIAGNOSIS — F3289 Other specified depressive episodes: Secondary | ICD-10-CM

## 2024-06-21 DIAGNOSIS — R569 Unspecified convulsions: Secondary | ICD-10-CM

## 2024-06-21 DIAGNOSIS — R4182 Altered mental status, unspecified: Secondary | ICD-10-CM

## 2024-06-21 LAB — CBC
HCT: 28.8 % — ABNORMAL LOW (ref 36.0–46.0)
Hemoglobin: 9.1 g/dL — ABNORMAL LOW (ref 12.0–15.0)
MCH: 27.2 pg (ref 26.0–34.0)
MCHC: 31.6 g/dL (ref 30.0–36.0)
MCV: 86.2 fL (ref 80.0–100.0)
Platelets: 300 K/uL (ref 150–400)
RBC: 3.34 MIL/uL — ABNORMAL LOW (ref 3.87–5.11)
RDW: 15.8 % — ABNORMAL HIGH (ref 11.5–15.5)
WBC: 7.9 K/uL (ref 4.0–10.5)
nRBC: 0 % (ref 0.0–0.2)

## 2024-06-21 LAB — GLUCOSE, CAPILLARY
Glucose-Capillary: 298 mg/dL — ABNORMAL HIGH (ref 70–99)
Glucose-Capillary: 352 mg/dL — ABNORMAL HIGH (ref 70–99)
Glucose-Capillary: 227 mg/dL — ABNORMAL HIGH (ref 70–99)
Glucose-Capillary: 221 mg/dL — ABNORMAL HIGH (ref 70–99)

## 2024-06-21 LAB — BASIC METABOLIC PANEL WITH GFR
Anion gap: 11 (ref 5–15)
BUN: 8 mg/dL (ref 8–23)
CO2: 25 mmol/L (ref 22–32)
Calcium: 8.8 mg/dL — ABNORMAL LOW (ref 8.9–10.3)
Chloride: 97 mmol/L — ABNORMAL LOW (ref 98–111)
Creatinine, Ser: 0.63 mg/dL (ref 0.44–1.00)
GFR, Estimated: >60 mL/min (ref >60)
Glucose, Bld: 300 mg/dL — ABNORMAL HIGH (ref 70–99)
Potassium: 4 mmol/L (ref 3.5–5.1)
Sodium: 133 mmol/L — ABNORMAL LOW (ref 135–145)

## 2024-06-21 LAB — MAGNESIUM: Magnesium: 1.3 mg/dL — ABNORMAL LOW (ref 1.7–2.4)

## 2024-06-21 LAB — PHOSPHORUS: Phosphorus: 3.2 mg/dL (ref 2.5–4.6)

## 2024-06-21 LAB — APTT: aPTT: 67 s — ABNORMAL HIGH (ref 24–36)

## 2024-06-21 LAB — HEPARIN LEVEL (UNFRACTIONATED): Heparin Unfractionated: 0.86 [IU]/mL — ABNORMAL HIGH (ref 0.30–0.70)

## 2024-06-21 MED ORDER — ENOXAPARIN SODIUM 80 MG/0.8ML IJ SOSY
1.0000 mg/kg | PREFILLED_SYRINGE | Freq: Two times a day (BID) | INTRAMUSCULAR | Status: DC
  Administered 2024-06-21 – 2024-06-22 (×3): 80 mg via SUBCUTANEOUS
  Filled 2024-06-21 (×3): qty 0.8

## 2024-06-21 MED ORDER — HALOPERIDOL 1 MG PO TABS
2.0000 mg | ORAL_TABLET | Freq: Two times a day (BID) | ORAL | Status: DC
  Administered 2024-06-21 – 2024-06-22 (×3): 2 mg via ORAL
  Filled 2024-06-21 (×5): qty 2

## 2024-06-21 MED ORDER — INSULIN GLARGINE-YFGN 100 UNIT/ML ~~LOC~~ SOLN
20.0000 [IU] | Freq: Every day | SUBCUTANEOUS | Status: DC
  Filled 2024-06-21: qty 0.2

## 2024-06-21 MED ORDER — MAGNESIUM SULFATE 4 GM/100ML IV SOLN
4.0000 g | Freq: Once | INTRAVENOUS | Status: AC
  Administered 2024-06-21: 4 g via INTRAVENOUS
  Filled 2024-06-21: qty 100

## 2024-06-21 MED ORDER — INSULIN GLARGINE-YFGN 100 UNIT/ML ~~LOC~~ SOLN
18.0000 [IU] | Freq: Every day | SUBCUTANEOUS | Status: DC
  Administered 2024-06-21 – 2024-06-22 (×2): 18 [IU] via SUBCUTANEOUS
  Filled 2024-06-21 (×2): qty 0.18

## 2024-06-21 MED ORDER — INSULIN ASPART 100 UNIT/ML IJ SOLN
4.0000 [IU] | Freq: Three times a day (TID) | INTRAMUSCULAR | Status: DC
  Administered 2024-06-21 – 2024-06-22 (×4): 4 [IU] via SUBCUTANEOUS

## 2024-06-21 NOTE — Progress Notes (Signed)
 [facility]  [phone] [phone]) [phone]   MEDICAL ONCOLOGY - INITIAL CONSULTATION    Referral MD  Reason for Referral: PE while on anticoagulation, splenic infarct  Chief Complaint  Patient presents with   Altered Mental Status    Assessment and plan This is a very pleasant 63 year old with metastatic endometrial cancer currently on CarboTaxol Keytruda , last chemotherapy received on 05/01/2024, second cycle has been delayed for several weeks because of recurrent hospitalizations, most recently admitted and discharged for stroke and she was supposed to be using anticoagulation, Eliquis and is scheduled for her second cycle of chemotherapy tomorrow presented with altered mental status, urinary tract infection and new pulmonary embolism in the right lower lobe segmental branch as well as a splenic infarct.  Based on the imaging from September it appears that the tumor has progressed compared to the July imaging with the extensive tumor occupying and invading the lumbar paraspinal retroperitoneal, para-aortic and pararenal spaces, this has not significantly changed compared to September exam.  Now she did not have a CT chest or a CT abdomen pelvis on her previous hospitalization in early October for acute ischemic stroke.  Hence I cannot conclude if this is a new pulmonary embolism in the right lower lobe.  If she was indeed very compliant with anticoagulation, this is highly unlikely.  Also the splenic infarct was not seen in September but there is no abdominal imaging in October when she came in with acute ischemic stroke hence I cannot comment if this is new or has been there since the most recent hospitalization.  She appears to be in no new acute respiratory distress.  Her confusion is likely once again from the urinary tract infection which is being treated.  I do agree it is okay to consider Lovenox  for anticoagulation 1 mg/kg SQ twice daily for at least a  week or so and then transition back to DOACs upon discharge.  I reviewed the images, the tumor appears more solid compared to the last imaging in September but may be slightly smaller. Goals of care to be discussed at Dr Buzz appointment.  PLAN  DC on lovenox  1 mg PO BID for a couple weeks followed by DOAC ( less likely to be compliant with [place] drugs) Consider CT with PE protocol to know the extent of PE, Once again this could have been there even when she was here in early October for stroke Splenic infarct new since September, but once again could have been there since early October Tumor with no significant change in size since September, may be slight smaller to same. I will inform Dr Lonn of her admission.   HPI: This is a 63 year old female patient with past medical history significant for uterine cancer diagnosed in 2023 status post surgery and adjuvant radiation, found to have cancer recurrence in July 2025 when she presented with severe back pain and CT imaging showed recurrent disease in the retroperitoneal lymph node.  It also showed spiculated lung mass suspicious for metastatic disease.  Left retroperitoneal mass biopsy confirmed adenocarcinoma ER positive p53 wild-type.  MSI high She appears to have a difficult time with chemotherapy, she was hospitalized after cycle 1 with altered mental status and UTI.  She had delayed cycle 2 because of diabetic ketoacidosis.   She has not received any chemotherapy since May 01, 2024.  She is scheduled for her chemotherapy tomorrow but unfortunately hospitalized once again with a chief complaint of altered mental status.  She  apparently started calling her stepson [name] which was actually her husband.  She also had some word finding difficulty and vision problems since her stroke on October 1.  She does have a history of prior urinary tract infections, UA from the ER showed moderate leukocytes many bacteria and more than 50 WBCs.  She  also had additional imaging, CT head showed evolving infarct of the right posterior and medial temporal lobe with no no evidence of new infarct.  CT abdomen pelvis noticed incidentally found proximal segmental right lower lobe PE, small splenic infarct, stable appearance of left psoas muscle infiltrative necrotic mass no other acute findings.  Hematology oncology was consulted given her personal history as well as since there was some concern about new PE while on anticoagulation.  Patient was by herself at the time of my visit.  She tells me today is a better day, her mind is more clear.  She tells me that she and her husband had some misunderstandings and in talking about it.  She is not quite sure if she is taking a blood thinner daily, she says her husband and her son give her the medications that she is supposed to take every day.  She also mentions some urinary symptoms such as pain with urination which has improved since she came to the hospital.  She is currently on IV antibiotics.  She denies any worsening shortness of breath, chest pain.  She is currently on heparin  drip for anticoagulation.  She also takes narcotics for pain management for her metastatic endometrial cancer, her pain is very well-controlled this morning.  Rest of the pertinent review of systems reviewed and negative   Past Medical History:  Diagnosis Date   Anemia    Arthritis    back, hands, hips (02/22/2015)   CAD (coronary artery disease)    a. complex LAD/diagonal bifurcation PCI in 2010. b. STEMI 10/2019 s/p PTCA/DES x1 to mLAD overlapping the old stent, residual disease treated medicaly.   Cancer [facility])    uterine   CHF (congestive heart failure) (HCC)    Depression    Diabetes mellitus (HCC)    started when I was pregnant; not sure if it was type 1 or type 2    History of radiation therapy    Endometrium- HDR 01/22/22-03/07/22- Dr. Lynwood Nasuti   Hypercholesterolemia    Hypertension    Morbid obesity (HCC)     Myocardial infarction (HCC)    mild x 3   Neuromuscular disorder (HCC)    neuropathy feet   Sleep apnea    cpap/  :   Past Surgical History:  Procedure Laterality Date   CARDIAC CATHETERIZATION  12/02/2012   CHOLECYSTECTOMY OPEN  09/04/1987   CORONARY ANGIOPLASTY WITH STENT PLACEMENT  09/03/2009   CORONARY/GRAFT ACUTE MI REVASCULARIZATION N/A 10/26/2019   Procedure: CORONARY/GRAFT ACUTE MI REVASCULARIZATION;  Surgeon: Verlin Lonni BIRCH, MD;  Location: [facility] INVASIVE CV LAB;  Service: Cardiovascular;  Laterality: N/A;   INCISIONAL HERNIA REPAIR N/A 12/09/2021   Procedure: LAPAROSCOPIC REPAIR UMBILICAL HERNIA WITH MESH;  Surgeon: Vernetta Berg, MD;  Location: [facility] ORS;  Service: General;  Laterality: N/A;   IR IMAGING GUIDED PORT INSERTION  04/08/2024   LEFT HEART CATH AND CORONARY ANGIOGRAPHY N/A 10/26/2019   Procedure: LEFT HEART CATH AND CORONARY ANGIOGRAPHY;  Surgeon: Verlin Lonni BIRCH, MD;  Location: [facility] INVASIVE CV LAB;  Service: Cardiovascular;  Laterality: N/A;   LEFT HEART CATHETERIZATION WITH CORONARY ANGIOGRAM N/A 12/25/2012   Procedure: LEFT HEART CATHETERIZATION  WITH CORONARY ANGIOGRAM;  Surgeon: Ozell Fell, MD;  Location: [facility] CATH LAB;  Service: Cardiovascular;  Laterality: N/A;   RADIOLOGY WITH ANESTHESIA N/A 04/21/2024   Procedure: MRI WITH ANESTHESIA;  Surgeon: Radiologist, Medication, MD;  Location: [facility] OR;  Service: Radiology;  Laterality: N/A;  BRAIN W/ W/O   ROBOTIC ASSISTED TOTAL HYSTERECTOMY WITH BILATERAL SALPINGO OOPHERECTOMY N/A 12/06/2021   Procedure: XI ROBOTIC ASSISTED TOTAL HYSTERECTOMY WITH BILATERAL SALPINGO OOPHORECTOMY;  Surgeon: [name] SAUNDERS, MD;  Location: [facility] ORS;  Service: Gynecology;  Laterality: N/A;   UMBILICAL HERNIA REPAIR  05/04/1989   doesn't think they used mesh  :   Current Facility-Administered Medications  Medication Dose Route Frequency Provider Last Rate Last Admin   acetaminophen  (TYLENOL ) tablet 650 mg  650 mg Oral  Q6H PRN Fausto Sor A, DO   650 mg at 06/20/24 1442   Or   acetaminophen  (TYLENOL ) suppository 650 mg  650 mg Rectal Q6H PRN Fausto Sor A, DO       atorvastatin  (LIPITOR ) tablet 80 mg  80 mg Oral QPM Fausto Sor A, DO   80 mg at 06/20/24 1811   bisacodyl (DULCOLAX) EC tablet 5 mg  5 mg Oral Daily PRN Fausto Sor A, DO       cefTRIAXone  (ROCEPHIN ) 1 g in sodium chloride  0.9 % 100 mL IVPB  1 g Intravenous Q24H Fausto Sor A, DO 200 mL/hr at 06/20/24 1634 1 g at 06/20/24 1634   Chlorhexidine  Gluconate Cloth 2 % PADS 6 each  6 each Topical Daily Fausto Sor A, DO   6 each at 06/21/24 0958   enoxaparin  (LOVENOX ) injection 80 mg  1 mg/kg Subcutaneous Q12H Bridgette Prentice PARAS, RPH       feeding supplement (GLUCERNA SHAKE) (GLUCERNA SHAKE) liquid 237 mL  237 mL Oral TID BM Elgergawy, Dawood S, MD   237 mL at 06/21/24 0958   FLUoxetine  (PROZAC ) capsule 40 mg  40 mg Oral Daily Fausto Sor A, DO   40 mg at 06/21/24 9040   guaiFENesin  (MUCINEX ) 12 hr tablet 600 mg  600 mg Oral BID PRN Fausto Sor A, DO       hydrOXYzine (ATARAX) tablet 10 mg  10 mg Oral TID PRN Hall, Carole N, DO   10 mg at 06/21/24 0533   insulin  aspart (novoLOG ) injection 0-5 Units  0-5 Units Subcutaneous QHS Fausto Sor A, DO   4 Units at 06/20/24 2127   insulin  aspart (novoLOG ) injection 0-9 Units  0-9 Units Subcutaneous TID WC Fausto Sor A, DO   5 Units at 06/21/24 9171   insulin  aspart (novoLOG ) injection 4 Units  4 Units Subcutaneous TID WC Elgergawy, Dawood S, MD       insulin  glargine-yfgn (SEMGLEE ) injection 18 Units  18 Units Subcutaneous Daily Elgergawy, Dawood S, MD       lidocaine  (LIDODERM ) 5 % 1 patch  1 patch Transdermal Q24H Fausto Sor A, DO   1 patch at 06/20/24 2114   mirtazapine (REMERON SOL-TAB) disintegrating tablet 15 mg  15 mg Oral QHS Elgergawy, Dawood S, MD   15 mg at 06/20/24 2115   morphine  (PF) 2 MG/ML injection 2 mg  2 mg Intravenous Q4H PRN Fausto Sor A, DO   2 mg  at 06/20/24 9260   ondansetron  (ZOFRAN ) tablet 4 mg  4 mg Oral Q6H PRN Fausto Sor A, DO       Or   ondansetron  (ZOFRAN ) injection 4 mg  4 mg Intravenous Q6H PRN Fausto Sor  A, DO   4 mg at 06/19/24 1931   oxyCODONE  (Oxy IR/ROXICODONE ) immediate release tablet 10 mg  10 mg Oral Q6H PRN Fausto Sor A, DO   10 mg at 06/21/24 0505   polyethylene glycol (MIRALAX  / GLYCOLAX ) packet 17 g  17 g Oral Daily PRN Fausto Sor A, DO       sodium chloride  flush (NS) 0.9 % injection 10-40 mL  10-40 mL Intracatheter Q12H Fausto Sor A, DO   10 mL at 06/21/24 1000   sodium chloride  flush (NS) 0.9 % injection 10-40 mL  10-40 mL Intracatheter PRN Fausto Sor A, DO          Allergies  Allergen Reactions   Hydrocodone Shortness Of Breath, Dermatitis and Other (See Comments)    I forget to breathe.   Clopidogrel Rash and Dermatitis  :   Family History  Problem Relation Age of Onset   Coronary artery disease Mother        in her mid 32s   Hypertension Mother    Cancer Mother        skin   Heart attack Father        in his mid 62s   COPD Father    Stroke Sister    Coronary artery disease Sister    Diabetes Sister    Other Half-Sister    Thyroid  cancer Daughter    Prostate cancer Neg Hx    Colon cancer Neg Hx    Breast cancer Neg Hx    Endometrial cancer Neg Hx    Ovarian cancer Neg Hx    Pancreatic cancer Neg Hx   :   Social History   Socioeconomic History   Marital status: Married    Spouse name: Not on file   Number of children: 2   Years of education: Not on file   Highest education level: Not on file  Occupational History   Occupation: Housewife  Tobacco Use   Smoking status: Former    Current packs/day: 0.00    Average packs/day: 0.5 packs/day for 1 year (0.5 ttl pk-yrs)    Types: Cigarettes, Cigars    Start date: 09/03/1985    Quit date: 09/03/1986    Years since quitting: 37.8   Smokeless tobacco: Never  Vaping Use   Vaping status: Never Used   Substance and Sexual Activity   Alcohol use: No   Drug use: No   Sexual activity: Not Currently  Other Topics Concern   Not on file  Social History Narrative   Not on file   Social Drivers of Health   Financial Resource Strain: High Risk (02/10/2021)   Overall Financial Resource Strain (CARDIA)    Difficulty of Paying Living Expenses: Hard  Food Insecurity: No Food Insecurity (06/19/2024)   Hunger Vital Sign    Worried About Running Out of Food in the Last Year: Never true    Ran Out of Food in the Last Year: Never true  Recent Concern: Food Insecurity - Food Insecurity Present (04/20/2024)   Hunger Vital Sign    Worried About Running Out of Food in the Last Year: Sometimes true    Ran Out of Food in the Last Year: Never true  Transportation Needs: No Transportation Needs (06/19/2024)   PRAPARE - Administrator, Civil Service (Medical): No    Lack of Transportation (Non-Medical): No  Physical Activity: Not on file  Stress: Not on file  Social Connections: Unknown (05/04/2024)   Social Connection  and Isolation Panel    Frequency of Communication with Friends and Family: Once a week    Frequency of Social Gatherings with Friends and Family: Never    Attends Religious Services: More than 4 times per year    Active Member of [employer] or Organizations: No    Attends Banker Meetings: 1 to 4 times per year    Marital Status: Patient declined  Intimate Partner Violence: Not At Risk (06/19/2024)   Humiliation, Afraid, Rape, and Kick questionnaire    Fear of Current or Ex-Partner: No    Emotionally Abused: No    Physically Abused: No    Sexually Abused: No  :  Pertinent items are noted in HPI.  Exam: Patient Vitals for the past 24 hrs:  BP Temp Temp src Pulse Resp SpO2  06/21/24 1139 (!) 128/116 97.9 F (36.6 C) Oral 93 (!) 22 95 %  06/21/24 0730 (!) 130/50 98.9 F (37.2 C) Oral 70 19 --  06/21/24 0345 (!) 148/69 97.6 F (36.4 C) Oral 86 (!) 21 100 %   06/20/24 2356 122/72 98 F (36.7 C) Oral -- 20 --  06/20/24 1934 (!) 139/43 97.7 F (36.5 C) Oral -- 15 98 %  06/20/24 1535 (!) 127/41 (!) 97.5 F (36.4 C) Oral 80 20 --    She was sitting in the bed at the time of my visit.  She appeared well, no acute distress, she was having a peaceful conversation with the physical therapist.  She is waiting for her food to arrive, she does endorse some appetite today.  She denies any confusion today. No palpable cervical adenopathy Anterior lung fields clear, posterior lung fields clear Rate and rhythm regular Sitting position, within the limits of the abdominal exam, no obvious tenderness or distention. 1+ lower extremity edema   Lab Results  Component Value Date   WBC 7.9 06/21/2024   HGB 9.1 (L) 06/21/2024   HCT 28.8 (L) 06/21/2024   PLT 300 06/21/2024   GLUCOSE 300 (H) 06/21/2024   CHOL 100 05/30/2024   TRIG 170 (H) 05/30/2024   HDL 38 (L) 05/30/2024   LDLDIRECT 135.9 02/22/2011   LDLCALC 28 05/30/2024   ALT 10 06/19/2024   AST 15 06/19/2024   NA 133 (L) 06/21/2024   K 4.0 06/21/2024   CL 97 (L) 06/21/2024   CREATININE 0.63 06/21/2024   BUN 8 06/21/2024   CO2 25 06/21/2024    EEG adult Result Date: 06/21/2024 Shelton Arlin KIDD, MD     06/21/2024  9:56 AM Patient Name: Mckinsley Koelzer MRN: [id number] Epilepsy Attending: Arlin KIDD Shelton Referring Physician/Provider: Sherlon Brayton RAMAN, MD Date: 06/21/2024 Duration: 22.24 mins Patient history: 63yo F with ams. EEG to evaluate for seizure Level of alertness: Awake AEDs during EEG study: None Technical aspects: This EEG study was done with scalp electrodes positioned according to the 10-20 International system of electrode placement. Electrical activity was reviewed with band pass filter of 1-70Hz , sensitivity of 7 uV/mm, display speed of 24mm/sec with a 60Hz  notched filter applied as appropriate. EEG data were recorded continuously and digitally stored.  Video monitoring was  available and reviewed as appropriate. Description: The posterior dominant rhythm consists of 8-9 Hz activity of moderate voltage (25-35 uV) seen predominantly in posterior head regions, symmetric and reactive to eye opening and eye closing. EEG showed intermittent generalized 5 to 6 Hz theta slowing. Hyperventilation and photic stimulation were not performed.   ABNORMALITY - Intermittent slow, generalized IMPRESSION: This study  is suggestive of mild generalized non specific cerebral dysfunction (encephalopathy). No seizures or epileptiform discharges were seen throughout the recording. Arlin MALVA Krebs   MR BRAIN WO CONTRAST Result Date: 06/20/2024 EXAM: MRI BRAIN WITHOUT CONTRAST 06/20/2024 11:36:24 PM TECHNIQUE: Multiplanar multisequence MRI of the head/brain was performed without the administration of intravenous contrast. COMPARISON: Head CT from 06/19/2024 as well as recent MRI from 05/31/2024. CLINICAL HISTORY: Mental status change, unknown cause; recent embolic stroke, with worsening mental state. FINDINGS: BRAIN AND VENTRICLES: There has been interval evolution of previously identified embolic infarcts involving the anterior and posterior circulation now subacute in appearance. Persistent diffusion signal abnormality about several of these infarcts, while several others have resolved since prior. Associated T2/FLAIR signal abnormality about the subacute right PCA territory infarct without significant regional mass effect or edema. No associated hemorrhage. No new areas of acute or interval infarction. No mass. No midline shift. No hydrocephalus. The sella is unremarkable. Normal flow voids. ORBITS: No acute abnormality. SINUSES AND MASTOIDS: Small bilateral mastoid effusions noted. Nasopharynx unremarkable. BONES AND SOFT TISSUES: Normal marrow signal. No acute soft tissue abnormality. IMPRESSION: 1. Interval evolution of previously identified embolic infarcts involving the anterior and posterior  circulations, now subacute in appearance. No significant associated mass effect or evidence for hemorrhagic transformation. 2. No new acute or interval infarction. 3. No other acute intracranial abnormality. Electronically signed by: Morene Hoard MD 06/20/2024 11:49 PM EDT RP Workstation: [id number]   CT ABDOMEN PELVIS W CONTRAST Result Date: 06/19/2024 CLINICAL DATA:  Abdominal pain, acute, nonlocalized EXAM: CT ABDOMEN AND PELVIS WITH CONTRAST TECHNIQUE: Multidetector CT imaging of the abdomen and pelvis was performed using the standard protocol following bolus administration of intravenous contrast. RADIATION DOSE REDUCTION: This exam was performed according to the departmental dose-optimization program which includes automated exposure control, adjustment of the mA and/or kV according to patient size and/or use of iterative reconstruction technique. CONTRAST:  75mL OMNIPAQUE  IOHEXOL  350 MG/ML SOLN COMPARISON:  05/04/2024 FINDINGS: Lower chest: No focal airspace consolidation or pleural effusion.Incidentally noted, proximal segmental pulmonary emboli in the right lower lobe. Dense atherosclerosis and possible stenting of the LAD. Hepatobiliary: No mass.Cholecystectomy. Mild dilation of the intrahepatic and extrahepatic bile ducts, likely related to the prior cholecystectomy. The portal veins are patent. Pancreas: Diffuse fatty atrophy of the pancreatic parenchyma. No mass or ductal dilation. No peripancreatic inflammation or fluid collection. Spleen: Wedge-shaped region of hypoattenuation in the posteromedial aspect of the spleen, measuring up to 1.7 cm, likely splenic infarct. Adrenals/Urinary Tract: No adrenal masses. Similar mass effect and anterior displacement of the left kidney. No renal mass. No nephrolithiasis or hydronephrosis. Partially distended urinary bladder without visualized abnormality. Stomach/Bowel: The stomach is decompressed without focal abnormality. 2.1 cm periampullary duodenum  diverticulum. No small bowel wall thickening or inflammation. No small bowel obstruction.Normal appendix. Vascular/Lymphatic: No aortic aneurysm. Scattered aortoiliac atherosclerosis. Subcentimeter paraesophageal lymph node along the right ward aspect of the distal esophagus measuring 7 mm, unchanged. In the posteromedial, left pararenal space, there is a large centrally necrotic mass with peripheral enhancement measuring 8.5 x 7.5 x 10.2 cm (APxTRxCC), mostly centered within the left psoas musculature, causing significant mass effect on the posteromedial aspect of the left kidney. The mass at least abuts the posterolateral wall of the infrarenal aorta and the left renal collecting system at the UPJ and proximal ureteral segment. Subtle erosive changes of the leftward anterior aspect of the L2 vertebral body (axial 46), unchanged. Interval decrease in size of the left external iliac chain lymph  node (axial 73), now measuring 0.8 cm (previously 1.8 cm). Reproductive: Hysterectomy. No concerning adnexal mass.No free pelvic fluid. Other: No pneumoperitoneum or ascites.  Mild anasarca. Musculoskeletal: No acute fracture or destructive lesion. Multilevel degenerative disc disease of the spine. IMPRESSION: 1. Incidentally noted proximal segmental pulmonary emboli in the right lower lobe. No findings to suggest right heart strain. 2. Small wedge-shaped region of hypoattenuation in the posteromedial aspect of the spleen measuring up to 1.7 cm, consistent with a splenic infarct. 3. Overall, similar size and appearance of the centrally necrotic, infiltrative mass in the left psoas muscle, measuring 8.5 x 7.5 x 10.2 cm. The mass abuts the left renal collecting system, without associated hydronephrosis. 4. Decrease in size of the left external iliac chain lymph node (axial 73), now measuring 0.8 cm (previously, 1.8 cm). Critical Value/emergent results were called by telephone at the time of interpretation on 06/19/2024 at 4:09  pm to provider [facility], who verbally acknowledged these results. Aortic Atherosclerosis (ICD10-I70.0). Electronically Signed   By: Rogelia Myers M.D.   On: 06/19/2024 16:15   CT Head Wo Contrast Result Date: 06/19/2024 CLINICAL DATA:  Recent stroke, increased confusion EXAM: CT HEAD WITHOUT CONTRAST TECHNIQUE: Contiguous axial images were obtained from the base of the skull through the vertex without intravenous contrast. RADIATION DOSE REDUCTION: This exam was performed according to the departmental dose-optimization program which includes automated exposure control, adjustment of the mA and/or kV according to patient size and/or use of iterative reconstruction technique. COMPARISON:  MRI 05/31/2024 FINDINGS: CT HEAD: There is an evolving infarct in the right posterior and medial temporal lobe with some cortical mineralization. There is no hemorrhage. No new ischemic changes. No mass lesion. The ventricles are normal. Skull/sinuses/orbits: No significant abnormality. IMPRESSION: Evolving infarct in the right posterior and medial temporal lobe No new infarct. Electronically Signed   By: Nancyann Burns M.D.   On: 06/19/2024 16:00   VAS US  LOWER EXTREMITY VENOUS (DVT) Result Date: 06/02/2024  Lower Venous DVT Study Patient Name:  CYNDIA DEGRAFF  Date of Exam:   06/02/2024 Medical Rec #: [id number]            Accession #:    [id number] Date of Birth: 1960/11/25            Patient Gender: F Patient Age:   69 years Exam Location:  [facility] Procedure:      VAS US  LOWER EXTREMITY VENOUS (DVT) Referring Phys: ARY XU --------------------------------------------------------------------------------  Indications: Stroke.  Risk Factors: Metastatic cancer, chemotherapy. Comparison Study: Previous exam on 02/09/2021 was negative for DVT Performing Technologist: Ezzie Potters RVT, RDMS  Examination Guidelines: A complete evaluation includes B-mode imaging, spectral Doppler, color Doppler, and power  Doppler as needed of all accessible portions of each vessel. Bilateral testing is considered an integral part of a complete examination. Limited examinations for reoccurring indications may be performed as noted. The reflux portion of the exam is performed with the patient in reverse Trendelenburg.  +---------+---------------+---------+-----------+----------+-------------------+ RIGHT    CompressibilityPhasicitySpontaneityPropertiesThrombus Aging      +---------+---------------+---------+-----------+----------+-------------------+ CFV      Full           Yes      Yes                                      +---------+---------------+---------+-----------+----------+-------------------+ SFJ      Full                                                             +---------+---------------+---------+-----------+----------+-------------------+  FV Prox  Full           Yes      Yes                                      +---------+---------------+---------+-----------+----------+-------------------+ FV Mid   Full           Yes      Yes                                      +---------+---------------+---------+-----------+----------+-------------------+ FV DistalFull           Yes      Yes                                      +---------+---------------+---------+-----------+----------+-------------------+ PFV      Full                                                             +---------+---------------+---------+-----------+----------+-------------------+ POP      Full           Yes      Yes                                      +---------+---------------+---------+-----------+----------+-------------------+ PTV      Full                                                             +---------+---------------+---------+-----------+----------+-------------------+ PERO     None           No       No                   Acute one of paired  +---------+---------------+---------+-----------+----------+-------------------+   +---------+---------------+---------+-----------+----------+--------------+ LEFT     CompressibilityPhasicitySpontaneityPropertiesThrombus Aging +---------+---------------+---------+-----------+----------+--------------+ CFV      Full           Yes      Yes                                 +---------+---------------+---------+-----------+----------+--------------+ SFJ      Full                                                        +---------+---------------+---------+-----------+----------+--------------+ FV Prox  Full           Yes      Yes                                 +---------+---------------+---------+-----------+----------+--------------+  FV Mid   Full           Yes      Yes                                 +---------+---------------+---------+-----------+----------+--------------+ FV DistalFull           Yes      Yes                                 +---------+---------------+---------+-----------+----------+--------------+ PFV      Full                                                        +---------+---------------+---------+-----------+----------+--------------+ POP      Full           Yes      Yes                                 +---------+---------------+---------+-----------+----------+--------------+ PTV      Full                                                        +---------+---------------+---------+-----------+----------+--------------+ PERO     Full                                                        +---------+---------------+---------+-----------+----------+--------------+     Summary: BILATERAL: -No evidence of popliteal cyst, bilaterally. RIGHT: - Findings consistent with acute deep vein thrombosis involving the right peroneal veins.   LEFT: - There is no evidence of deep vein thrombosis in the lower extremity.  *See table(s) above for  measurements and observations. Electronically signed by Debby Robertson on 06/02/2024 at 4:10:55 PM.    Final    ECHOCARDIOGRAM COMPLETE Result Date: 06/01/2024    ECHOCARDIOGRAM REPORT   Patient Name:   DARITZA BREES Dippolito Date of Exam: 06/01/2024 Medical Rec #:  [id number]           Height:       62.0 in Accession #:    [id number]          Weight:       187.4 lb Date of Birth:  05-Dec-1960           BSA:          1.860 m Patient Age:    63 years            BP:           101/63 mmHg Patient Gender: F                   HR:           56 bpm. Exam Location:  Inpatient Procedure: 2D Echo, Cardiac Doppler, Color Doppler and Saline Contrast Bubble  Study (Both Spectral and Color Flow Doppler were utilized during            procedure). Indications:    Stroke  History:        Patient has prior history of Echocardiogram examinations, most                 recent 04/21/2024. CHF, CAD, Signs/Symptoms:Dyspnea; Risk                 Factors:Hypertension, Dyslipidemia, Sleep Apnea and Diabetes.  Sonographer:    Philomena Daring Referring Phys: ERIC J [place] IMPRESSIONS  1. Left ventricular ejection fraction, by estimation, is 55 to 60%. The left ventricle has normal function. The left ventricle has no regional wall motion abnormalities. Left ventricular diastolic parameters are consistent with Grade II diastolic dysfunction (pseudonormalization).  2. Right ventricular systolic function is low normal. The right ventricular size is normal.  3. The mitral valve is normal in structure. No evidence of mitral valve regurgitation. No evidence of mitral stenosis.  4. The aortic valve is calcified. There is moderate calcification of the aortic valve. There is moderate thickening of the aortic valve. Aortic valve regurgitation is not visualized. Mild aortic valve stenosis. Aortic valve area, by VTI measures 1.47 cm. Aortic valve mean gradient measures 15.0 mmHg. Aortic valve Vmax measures 2.71 m/s.  5. The inferior vena cava is normal  in size with greater than 50% respiratory variability, suggesting right atrial pressure of 3 mmHg. FINDINGS  Left Ventricle: Left ventricular ejection fraction, by estimation, is 55 to 60%. The left ventricle has normal function. The left ventricle has no regional wall motion abnormalities. The left ventricular internal cavity size was normal in size. There is  no left ventricular hypertrophy. Left ventricular diastolic parameters are consistent with Grade II diastolic dysfunction (pseudonormalization). Right Ventricle: The right ventricular size is normal. No increase in right ventricular wall thickness. Right ventricular systolic function is low normal. Left Atrium: Left atrial size was normal in size. Right Atrium: Right atrial size was normal in size. Pericardium: There is no evidence of pericardial effusion. Mitral Valve: The mitral valve is normal in structure. No evidence of mitral valve regurgitation. No evidence of mitral valve stenosis. Tricuspid Valve: The tricuspid valve is normal in structure. Tricuspid valve regurgitation is not demonstrated. No evidence of tricuspid stenosis. Aortic Valve: The aortic valve is calcified. There is moderate calcification of the aortic valve. There is moderate thickening of the aortic valve. Aortic valve regurgitation is not visualized. Mild aortic stenosis is present. Aortic valve mean gradient measures 15.0 mmHg. Aortic valve peak gradient measures 29.4 mmHg. Aortic valve area, by VTI measures 1.47 cm. Pulmonic Valve: The pulmonic valve was normal in structure. Pulmonic valve regurgitation is not visualized. No evidence of pulmonic stenosis. Aorta: The aortic root is normal in size and structure. Venous: The inferior vena cava is normal in size with greater than 50% respiratory variability, suggesting right atrial pressure of 3 mmHg. IAS/Shunts: No atrial level shunt detected by color flow Doppler. Agitated saline contrast was given intravenously to evaluate for  intracardiac shunting.  LEFT VENTRICLE PLAX 2D LVIDd:         5.20 cm   Diastology LVIDs:         3.60 cm   LV e' medial:    6.20 cm/s LV PW:         1.20 cm   LV E/e' medial:  19.5 LV IVS:        1.00 cm  LV e' lateral:   6.42 cm/s LVOT diam:     2.00 cm   LV E/e' lateral: 18.8 LV SV:         90 LV SV Index:   48 LVOT Area:     3.14 cm  RIGHT VENTRICLE            IVC RV S prime:     8.05 cm/s  IVC diam: 1.80 cm TAPSE (M-mode): 1.5 cm LEFT ATRIUM             Index        RIGHT ATRIUM           Index LA diam:        3.80 cm 2.04 cm/m   RA Area:     12.20 cm LA Vol (A2C):   52.0 ml 27.96 ml/m  RA Volume:   23.10 ml  12.42 ml/m LA Vol (A4C):   50.1 ml 26.94 ml/m LA Biplane Vol: 52.2 ml 28.07 ml/m  AORTIC VALVE AV Area (Vmax):    1.36 cm AV Area (Vmean):   1.36 cm AV Area (VTI):     1.47 cm AV Vmax:           271.00 cm/s AV Vmean:          179.250 cm/s AV VTI:            0.612 m AV Peak Grad:      29.4 mmHg AV Mean Grad:      15.0 mmHg LVOT Vmax:         117.00 cm/s LVOT Vmean:        77.600 cm/s LVOT VTI:          0.286 m LVOT/AV VTI ratio: 0.47  AORTA Ao Root diam: 2.80 cm Ao Asc diam:  3.50 cm MITRAL VALVE MV Area (PHT): 2.14 cm     SHUNTS MV Decel Time: 355 msec     Systemic VTI:  0.29 m MV E velocity: 121.00 cm/s  Systemic Diam: 2.00 cm MV A velocity: 123.00 cm/s MV E/A ratio:  0.98 Morene Brownie Electronically signed by Morene Brownie Signature Date/Time: 06/01/2024/9:38:18 PM    Final    EEG adult Result Date: 06/01/2024 Matthews Elida HERO, MD     06/01/2024  7:53 AM Routine EEG Report Mckinzie Saksa is a 64 y.o. female with a history of altered mental status who is undergoing an EEG to evaluate for seizures. Report: This EEG was acquired with electrodes placed according to the International 10-20 electrode system (including Fp1, Fp2, F3, F4, C3, C4, P3, P4, O1, O2, T3, T4, T5, T6, A1, A2, Fz, Cz, Pz). The following electrodes were missing or displaced: none. The occipital dominant rhythm was  6-7 Hz. This activity is reactive to stimulation. Drowsiness was manifested by background fragmentation; deeper stages of sleep were not identified. There was no focal slowing. There were no interictal epileptiform discharges. There were no electrographic seizures identified. Photic stimulation and hyperventilation were not performed. Impression and clinical correlation: This EEG was obtained while awake and drowsy and is abnormal due to mild diffuse slowing indicative of global cerebral dysfunction. Epileptiform abnormalities were not seen during this recording. Elida Matthews, MD [facility] [phone] If 7pm- 7am, please page neurology on call as listed in AMION.   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 Confusion, weakness, neurologic deficit. EXAM: MRI HEAD WITHOUT AND WITH CONTRAST TECHNIQUE: Multiplanar,  multiecho pulse sequences of the brain and surrounding structures were obtained without and with intravenous contrast. CONTRAST:  8mL GADAVIST  GADOBUTROL  1 MMOL/ML IV SOLN COMPARISON:  Head CT without contrast 0101 hours today. Brain MRI 04/21/2024. FINDINGS: Brain: Confluent restricted diffusion in the right PCA territory from the posterior mesial temporal lobe, patchy right occipital lobe involvement, periatrial white matter involvement and marginal involvement of the splenium of the corpus callosum. Additional numerous small foci of restricted diffusion scattered in the bilateral cerebellum and cerebral hemispheres. Anterior and posterior circulation affected bilaterally. Deep gray nuclei and brainstem are relatively spared. Cytotoxic edema in the affected areas. No intracranial hemorrhage identified. Luxury perfusion enhancement appearance in the right PCA territory (series 17, image 13). No masslike or suspicious postcontrast enhancement. No dural thickening. No midline shift, ventriculomegaly, evidence of  discrete intracranial mass. Cervicomedullary junction and pituitary are within normal limits. No abnormal enhancement identified. Evidence of mass lesion, ventriculomegaly, extra-axial collection or acute intracranial hemorrhage. Cervicomedullary junction and pituitary are within normal limits. Vascular: Major intracranial vascular flow voids are preserved. Skull and upper cervical spine: Visualized bone marrow signal is within normal limits. Negative visible cervical spine and spinal cord. Sinuses/Orbits: Negative. Other: Stable mild mastoid effusion since last month. IMPRESSION: 1. Numerous small acute embolic infarcts in the bilateral anterior and posterior circulation, with superimposed confluent Right PCA territory infarct corresponding to that seen on CT this morning. 2. Cytotoxic edema with no intracranial hemorrhage or mass effect. 3. No intracranial metastatic disease identified. Electronically Signed   By: VEAR Hurst M.D.   On: 05/31/2024 10:49   CT Head Wo Contrast Result Date: 05/31/2024 CLINICAL DATA:  Confusion, weakness, mental status changes EXAM: CT HEAD WITHOUT CONTRAST TECHNIQUE: Contiguous axial images were obtained from the base of the skull through the vertex without intravenous contrast. RADIATION DOSE REDUCTION: This exam was performed according to the departmental dose-optimization program which includes automated exposure control, adjustment of the mA and/or kV according to patient size and/or use of iterative reconstruction technique. COMPARISON:  04/20/2024 FINDINGS: Brain: Area of low-density noted in the right posterior temporal and occipital lobes adjacent to the right lateral ventricle, new since prior study concerning for acute to subacute infarcts. No hemorrhage or hydrocephalus. No mass effect or midline shift. Vascular: No hyperdense vessel or unexpected calcification. Skull: No acute calvarial abnormality. Sinuses/Orbits: No acute findings Other: None IMPRESSION: New low-density  areas in the posterior right temporal lobe and occipital lobe concerning for acute to subacute infarcts. These are new since prior study. Electronically Signed   By: Franky Crease M.D.   On: 05/31/2024 01:20   DG Chest Port 1 View Result Date: 05/31/2024 CLINICAL DATA:  Altered mental status EXAM: PORTABLE CHEST 1 VIEW COMPARISON:  05/21/2024 FINDINGS: Stable chronic elevation of the right hemidiaphragm. Right Port-A-Cath remains in place, unchanged. Cardiomediastinal contours are unchanged. No confluent airspace opacities, effusions or edema. No acute bony abnormality. IMPRESSION: No active disease. Electronically Signed   By: Franky Crease M.D.   On: 05/31/2024 00:20    Pathology:NA  EEG adult Result Date: 06/21/2024 Shelton Arlin KIDD, MD     06/21/2024  9:56 AM Patient Name: Shamarie Call MRN: [id number] Epilepsy Attending: Arlin KIDD Shelton Referring Physician/Provider: Sherlon Brayton RAMAN, MD Date: 06/21/2024 Duration: 22.24 mins Patient history: 63yo F with ams. EEG to evaluate for seizure Level of alertness: Awake AEDs during EEG study: None Technical aspects: This EEG study was done with scalp electrodes positioned according to the 10-20 International system of  electrode placement. Electrical activity was reviewed with band pass filter of 1-70Hz , sensitivity of 7 uV/mm, display speed of 93mm/sec with a 60Hz  notched filter applied as appropriate. EEG data were recorded continuously and digitally stored.  Video monitoring was available and reviewed as appropriate. Description: The posterior dominant rhythm consists of 8-9 Hz activity of moderate voltage (25-35 uV) seen predominantly in posterior head regions, symmetric and reactive to eye opening and eye closing. EEG showed intermittent generalized 5 to 6 Hz theta slowing. Hyperventilation and photic stimulation were not performed.   ABNORMALITY - Intermittent slow, generalized IMPRESSION: This study is suggestive of mild generalized non specific  cerebral dysfunction (encephalopathy). No seizures or epileptiform discharges were seen throughout the recording. Arlin MALVA Krebs   MR BRAIN WO CONTRAST Result Date: 06/20/2024 EXAM: MRI BRAIN WITHOUT CONTRAST 06/20/2024 11:36:24 PM TECHNIQUE: Multiplanar multisequence MRI of the head/brain was performed without the administration of intravenous contrast. COMPARISON: Head CT from 06/19/2024 as well as recent MRI from 05/31/2024. CLINICAL HISTORY: Mental status change, unknown cause; recent embolic stroke, with worsening mental state. FINDINGS: BRAIN AND VENTRICLES: There has been interval evolution of previously identified embolic infarcts involving the anterior and posterior circulation now subacute in appearance. Persistent diffusion signal abnormality about several of these infarcts, while several others have resolved since prior. Associated T2/FLAIR signal abnormality about the subacute right PCA territory infarct without significant regional mass effect or edema. No associated hemorrhage. No new areas of acute or interval infarction. No mass. No midline shift. No hydrocephalus. The sella is unremarkable. Normal flow voids. ORBITS: No acute abnormality. SINUSES AND MASTOIDS: Small bilateral mastoid effusions noted. Nasopharynx unremarkable. BONES AND SOFT TISSUES: Normal marrow signal. No acute soft tissue abnormality. IMPRESSION: 1. Interval evolution of previously identified embolic infarcts involving the anterior and posterior circulations, now subacute in appearance. No significant associated mass effect or evidence for hemorrhagic transformation. 2. No new acute or interval infarction. 3. No other acute intracranial abnormality. Electronically signed by: Morene Hoard MD 06/20/2024 11:49 PM EDT RP Workstation: [id number]   CT ABDOMEN PELVIS W CONTRAST Result Date: 06/19/2024 CLINICAL DATA:  Abdominal pain, acute, nonlocalized EXAM: CT ABDOMEN AND PELVIS WITH CONTRAST TECHNIQUE: Multidetector  CT imaging of the abdomen and pelvis was performed using the standard protocol following bolus administration of intravenous contrast. RADIATION DOSE REDUCTION: This exam was performed according to the departmental dose-optimization program which includes automated exposure control, adjustment of the mA and/or kV according to patient size and/or use of iterative reconstruction technique. CONTRAST:  75mL OMNIPAQUE  IOHEXOL  350 MG/ML SOLN COMPARISON:  05/04/2024 FINDINGS: Lower chest: No focal airspace consolidation or pleural effusion.Incidentally noted, proximal segmental pulmonary emboli in the right lower lobe. Dense atherosclerosis and possible stenting of the LAD. Hepatobiliary: No mass.Cholecystectomy. Mild dilation of the intrahepatic and extrahepatic bile ducts, likely related to the prior cholecystectomy. The portal veins are patent. Pancreas: Diffuse fatty atrophy of the pancreatic parenchyma. No mass or ductal dilation. No peripancreatic inflammation or fluid collection. Spleen: Wedge-shaped region of hypoattenuation in the posteromedial aspect of the spleen, measuring up to 1.7 cm, likely splenic infarct. Adrenals/Urinary Tract: No adrenal masses. Similar mass effect and anterior displacement of the left kidney. No renal mass. No nephrolithiasis or hydronephrosis. Partially distended urinary bladder without visualized abnormality. Stomach/Bowel: The stomach is decompressed without focal abnormality. 2.1 cm periampullary duodenum diverticulum. No small bowel wall thickening or inflammation. No small bowel obstruction.Normal appendix. Vascular/Lymphatic: No aortic aneurysm. Scattered aortoiliac atherosclerosis. Subcentimeter paraesophageal lymph node along the right ward aspect of  the distal esophagus measuring 7 mm, unchanged. In the posteromedial, left pararenal space, there is a large centrally necrotic mass with peripheral enhancement measuring 8.5 x 7.5 x 10.2 cm (APxTRxCC), mostly centered within the  left psoas musculature, causing significant mass effect on the posteromedial aspect of the left kidney. The mass at least abuts the posterolateral wall of the infrarenal aorta and the left renal collecting system at the UPJ and proximal ureteral segment. Subtle erosive changes of the leftward anterior aspect of the L2 vertebral body (axial 46), unchanged. Interval decrease in size of the left external iliac chain lymph node (axial 73), now measuring 0.8 cm (previously 1.8 cm). Reproductive: Hysterectomy. No concerning adnexal mass.No free pelvic fluid. Other: No pneumoperitoneum or ascites.  Mild anasarca. Musculoskeletal: No acute fracture or destructive lesion. Multilevel degenerative disc disease of the spine. IMPRESSION: 1. Incidentally noted proximal segmental pulmonary emboli in the right lower lobe. No findings to suggest right heart strain. 2. Small wedge-shaped region of hypoattenuation in the posteromedial aspect of the spleen measuring up to 1.7 cm, consistent with a splenic infarct. 3. Overall, similar size and appearance of the centrally necrotic, infiltrative mass in the left psoas muscle, measuring 8.5 x 7.5 x 10.2 cm. The mass abuts the left renal collecting system, without associated hydronephrosis. 4. Decrease in size of the left external iliac chain lymph node (axial 73), now measuring 0.8 cm (previously, 1.8 cm). Critical Value/emergent results were called by telephone at the time of interpretation on 06/19/2024 at 4:09 pm to provider [facility], who verbally acknowledged these results. Aortic Atherosclerosis (ICD10-I70.0). Electronically Signed   By: Rogelia Myers M.D.   On: 06/19/2024 16:15   CT Head Wo Contrast Result Date: 06/19/2024 CLINICAL DATA:  Recent stroke, increased confusion EXAM: CT HEAD WITHOUT CONTRAST TECHNIQUE: Contiguous axial images were obtained from the base of the skull through the vertex without intravenous contrast. RADIATION DOSE REDUCTION: This exam was  performed according to the departmental dose-optimization program which includes automated exposure control, adjustment of the mA and/or kV according to patient size and/or use of iterative reconstruction technique. COMPARISON:  MRI 05/31/2024 FINDINGS: CT HEAD: There is an evolving infarct in the right posterior and medial temporal lobe with some cortical mineralization. There is no hemorrhage. No new ischemic changes. No mass lesion. The ventricles are normal. Skull/sinuses/orbits: No significant abnormality. IMPRESSION: Evolving infarct in the right posterior and medial temporal lobe No new infarct. Electronically Signed   By: Nancyann Burns M.D.   On: 06/19/2024 16:00   VAS US  LOWER EXTREMITY VENOUS (DVT) Result Date: 06/02/2024  Lower Venous DVT Study Patient Name:  MING KUNKA  Date of Exam:   06/02/2024 Medical Rec #: [id number]            Accession #:    [id number] Date of Birth: 01/26/1961            Patient Gender: F Patient Age:   81 years Exam Location:  [facility] Procedure:      VAS US  LOWER EXTREMITY VENOUS (DVT) Referring Phys: ARY XU --------------------------------------------------------------------------------  Indications: Stroke.  Risk Factors: Metastatic cancer, chemotherapy. Comparison Study: Previous exam on 02/09/2021 was negative for DVT Performing Technologist: Ezzie Potters RVT, RDMS  Examination Guidelines: A complete evaluation includes B-mode imaging, spectral Doppler, color Doppler, and power Doppler as needed of all accessible portions of each vessel. Bilateral testing is considered an integral part of a complete examination. Limited examinations for reoccurring indications may be performed as noted.  The reflux portion of the exam is performed with the patient in reverse Trendelenburg.  +---------+---------------+---------+-----------+----------+-------------------+ RIGHT    CompressibilityPhasicitySpontaneityPropertiesThrombus Aging       +---------+---------------+---------+-----------+----------+-------------------+ CFV      Full           Yes      Yes                                      +---------+---------------+---------+-----------+----------+-------------------+ SFJ      Full                                                             +---------+---------------+---------+-----------+----------+-------------------+ FV Prox  Full           Yes      Yes                                      +---------+---------------+---------+-----------+----------+-------------------+ FV Mid   Full           Yes      Yes                                      +---------+---------------+---------+-----------+----------+-------------------+ FV DistalFull           Yes      Yes                                      +---------+---------------+---------+-----------+----------+-------------------+ PFV      Full                                                             +---------+---------------+---------+-----------+----------+-------------------+ POP      Full           Yes      Yes                                      +---------+---------------+---------+-----------+----------+-------------------+ PTV      Full                                                             +---------+---------------+---------+-----------+----------+-------------------+ PERO     None           No       No                   Acute one of paired +---------+---------------+---------+-----------+----------+-------------------+   +---------+---------------+---------+-----------+----------+--------------+ LEFT     CompressibilityPhasicitySpontaneityPropertiesThrombus Aging +---------+---------------+---------+-----------+----------+--------------+ CFV      Full  Yes      Yes                                 +---------+---------------+---------+-----------+----------+--------------+ SFJ      Full                                                         +---------+---------------+---------+-----------+----------+--------------+ FV Prox  Full           Yes      Yes                                 +---------+---------------+---------+-----------+----------+--------------+ FV Mid   Full           Yes      Yes                                 +---------+---------------+---------+-----------+----------+--------------+ FV DistalFull           Yes      Yes                                 +---------+---------------+---------+-----------+----------+--------------+ PFV      Full                                                        +---------+---------------+---------+-----------+----------+--------------+ POP      Full           Yes      Yes                                 +---------+---------------+---------+-----------+----------+--------------+ PTV      Full                                                        +---------+---------------+---------+-----------+----------+--------------+ PERO     Full                                                        +---------+---------------+---------+-----------+----------+--------------+     Summary: BILATERAL: -No evidence of popliteal cyst, bilaterally. RIGHT: - Findings consistent with acute deep vein thrombosis involving the right peroneal veins.   LEFT: - There is no evidence of deep vein thrombosis in the lower extremity.  *See table(s) above for measurements and observations. Electronically signed by Debby Robertson on 06/02/2024 at 4:10:55 PM.    Final    ECHOCARDIOGRAM COMPLETE Result Date: 06/01/2024    ECHOCARDIOGRAM REPORT   Patient Name:   OREAN GIARRATANO Bechtol Date of Exam: 06/01/2024  Medical Rec #:  [id number]           Height:       62.0 in Accession #:    [id number]          Weight:       187.4 lb Date of Birth:  [birth year]           BSA:          1.860 m Patient Age:    63 years            BP:           101/63 mmHg  Patient Gender: F                   HR:           56 bpm. Exam Location:  Inpatient Procedure: 2D Echo, Cardiac Doppler, Color Doppler and Saline Contrast Bubble            Study (Both Spectral and Color Flow Doppler were utilized during            procedure). Indications:    Stroke  History:        Patient has prior history of Echocardiogram examinations, most                 recent 04/21/2024. CHF, CAD, Signs/Symptoms:Dyspnea; Risk                 Factors:Hypertension, Dyslipidemia, Sleep Apnea and Diabetes.  Sonographer:    Philomena Daring Referring Phys: ERIC J [place] IMPRESSIONS  1. Left ventricular ejection fraction, by estimation, is 55 to 60%. The left ventricle has normal function. The left ventricle has no regional wall motion abnormalities. Left ventricular diastolic parameters are consistent with Grade II diastolic dysfunction (pseudonormalization).  2. Right ventricular systolic function is low normal. The right ventricular size is normal.  3. The mitral valve is normal in structure. No evidence of mitral valve regurgitation. No evidence of mitral stenosis.  4. The aortic valve is calcified. There is moderate calcification of the aortic valve. There is moderate thickening of the aortic valve. Aortic valve regurgitation is not visualized. Mild aortic valve stenosis. Aortic valve area, by VTI measures 1.47 cm. Aortic valve mean gradient measures 15.0 mmHg. Aortic valve Vmax measures 2.71 m/s.  5. The inferior vena cava is normal in size with greater than 50% respiratory variability, suggesting right atrial pressure of 3 mmHg. FINDINGS  Left Ventricle: Left ventricular ejection fraction, by estimation, is 55 to 60%. The left ventricle has normal function. The left ventricle has no regional wall motion abnormalities. The left ventricular internal cavity size was normal in size. There is  no left ventricular hypertrophy. Left ventricular diastolic parameters are consistent with Grade II diastolic dysfunction  (pseudonormalization). Right Ventricle: The right ventricular size is normal. No increase in right ventricular wall thickness. Right ventricular systolic function is low normal. Left Atrium: Left atrial size was normal in size. Right Atrium: Right atrial size was normal in size. Pericardium: There is no evidence of pericardial effusion. Mitral Valve: The mitral valve is normal in structure. No evidence of mitral valve regurgitation. No evidence of mitral valve stenosis. Tricuspid Valve: The tricuspid valve is normal in structure. Tricuspid valve regurgitation is not demonstrated. No evidence of tricuspid stenosis. Aortic Valve: The aortic valve is calcified. There is moderate calcification of the aortic valve. There is moderate thickening of the aortic valve. Aortic valve regurgitation is not visualized. Mild aortic stenosis is  present. Aortic valve mean gradient measures 15.0 mmHg. Aortic valve peak gradient measures 29.4 mmHg. Aortic valve area, by VTI measures 1.47 cm. Pulmonic Valve: The pulmonic valve was normal in structure. Pulmonic valve regurgitation is not visualized. No evidence of pulmonic stenosis. Aorta: The aortic root is normal in size and structure. Venous: The inferior vena cava is normal in size with greater than 50% respiratory variability, suggesting right atrial pressure of 3 mmHg. IAS/Shunts: No atrial level shunt detected by color flow Doppler. Agitated saline contrast was given intravenously to evaluate for intracardiac shunting.  LEFT VENTRICLE PLAX 2D LVIDd:         5.20 cm   Diastology LVIDs:         3.60 cm   LV e' medial:    6.20 cm/s LV PW:         1.20 cm   LV E/e' medial:  19.5 LV IVS:        1.00 cm   LV e' lateral:   6.42 cm/s LVOT diam:     2.00 cm   LV E/e' lateral: 18.8 LV SV:         90 LV SV Index:   48 LVOT Area:     3.14 cm  RIGHT VENTRICLE            IVC RV S prime:     8.05 cm/s  IVC diam: 1.80 cm TAPSE (M-mode): 1.5 cm LEFT ATRIUM             Index        RIGHT ATRIUM            Index LA diam:        3.80 cm 2.04 cm/m   RA Area:     12.20 cm LA Vol (A2C):   52.0 ml 27.96 ml/m  RA Volume:   23.10 ml  12.42 ml/m LA Vol (A4C):   50.1 ml 26.94 ml/m LA Biplane Vol: 52.2 ml 28.07 ml/m  AORTIC VALVE AV Area (Vmax):    1.36 cm AV Area (Vmean):   1.36 cm AV Area (VTI):     1.47 cm AV Vmax:           271.00 cm/s AV Vmean:          179.250 cm/s AV VTI:            0.612 m AV Peak Grad:      29.4 mmHg AV Mean Grad:      15.0 mmHg LVOT Vmax:         117.00 cm/s LVOT Vmean:        77.600 cm/s LVOT VTI:          0.286 m LVOT/AV VTI ratio: 0.47  AORTA Ao Root diam: 2.80 cm Ao Asc diam:  3.50 cm MITRAL VALVE MV Area (PHT): 2.14 cm     SHUNTS MV Decel Time: 355 msec     Systemic VTI:  0.29 m MV E velocity: 121.00 cm/s  Systemic Diam: 2.00 cm MV A velocity: 123.00 cm/s MV E/A ratio:  0.98 Morene Brownie Electronically signed by Morene Brownie Signature Date/Time: 06/01/2024/9:38:18 PM    Final    EEG adult Result Date: 06/01/2024 Matthews Elida HERO, MD     06/01/2024  7:53 AM Routine EEG Report Catalena Stanhope is a 63 y.o. female with a history of altered mental status who is undergoing an EEG to evaluate for seizures. Report: This EEG was acquired with electrodes placed according  to the International 10-20 electrode system (including Fp1, Fp2, F3, F4, C3, C4, P3, P4, O1, O2, T3, T4, T5, T6, A1, A2, Fz, Cz, Pz). The following electrodes were missing or displaced: none. The occipital dominant rhythm was 6-7 Hz. This activity is reactive to stimulation. Drowsiness was manifested by background fragmentation; deeper stages of sleep were not identified. There was no focal slowing. There were no interictal epileptiform discharges. There were no electrographic seizures identified. Photic stimulation and hyperventilation were not performed. Impression and clinical correlation: This EEG was obtained while awake and drowsy and is abnormal due to mild diffuse slowing indicative of global cerebral  dysfunction. Epileptiform abnormalities were not seen during this recording. [name], MD [facility] [phone] If 7pm- 7am, please page neurology on call as listed in AMION.   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 Confusion, weakness, neurologic deficit. EXAM: MRI HEAD WITHOUT AND WITH CONTRAST TECHNIQUE: Multiplanar, multiecho pulse sequences of the brain and surrounding structures were obtained without and with intravenous contrast. CONTRAST:  8mL GADAVIST  GADOBUTROL  1 MMOL/ML IV SOLN COMPARISON:  Head CT without contrast 0101 hours today. Brain MRI 04/21/2024. FINDINGS: Brain: Confluent restricted diffusion in the right PCA territory from the posterior mesial temporal lobe, patchy right occipital lobe involvement, periatrial white matter involvement and marginal involvement of the splenium of the corpus callosum. Additional numerous small foci of restricted diffusion scattered in the bilateral cerebellum and cerebral hemispheres. Anterior and posterior circulation affected bilaterally. Deep gray nuclei and brainstem are relatively spared. Cytotoxic edema in the affected areas. No intracranial hemorrhage identified. Luxury perfusion enhancement appearance in the right PCA territory (series 17, image 13). No masslike or suspicious postcontrast enhancement. No dural thickening. No midline shift, ventriculomegaly, evidence of discrete intracranial mass. Cervicomedullary junction and pituitary are within normal limits. No abnormal enhancement identified. Evidence of mass lesion, ventriculomegaly, extra-axial collection or acute intracranial hemorrhage. Cervicomedullary junction and pituitary are within normal limits. Vascular: Major intracranial vascular flow voids are preserved. Skull and upper cervical spine: Visualized bone marrow signal is within normal limits. Negative visible cervical spine and spinal  cord. Sinuses/Orbits: Negative. Other: Stable mild mastoid effusion since last month. IMPRESSION: 1. Numerous small acute embolic infarcts in the bilateral anterior and posterior circulation, with superimposed confluent Right PCA territory infarct corresponding to that seen on CT this morning. 2. Cytotoxic edema with no intracranial hemorrhage or mass effect. 3. No intracranial metastatic disease identified. Electronically Signed   By: VEAR Hurst M.D.   On: 05/31/2024 10:49   CT Head Wo Contrast Result Date: 05/31/2024 CLINICAL DATA:  Confusion, weakness, mental status changes EXAM: CT HEAD WITHOUT CONTRAST TECHNIQUE: Contiguous axial images were obtained from the base of the skull through the vertex without intravenous contrast. RADIATION DOSE REDUCTION: This exam was performed according to the departmental dose-optimization program which includes automated exposure control, adjustment of the mA and/or kV according to patient size and/or use of iterative reconstruction technique. COMPARISON:  04/20/2024 FINDINGS: Brain: Area of low-density noted in the right posterior temporal and occipital lobes adjacent to the right lateral ventricle, new since prior study concerning for acute to subacute infarcts. No hemorrhage or hydrocephalus. No mass effect or midline shift. Vascular: No hyperdense vessel or unexpected calcification. Skull: No acute calvarial abnormality. Sinuses/Orbits: No acute findings Other: None IMPRESSION: New low-density areas in the posterior right temporal lobe and occipital lobe concerning for acute to subacute infarcts. These are new since prior study.  Electronically Signed   By: Franky Crease M.D.   On: 05/31/2024 01:20   DG Chest Port 1 View Result Date: 05/31/2024 CLINICAL DATA:  Altered mental status EXAM: PORTABLE CHEST 1 VIEW COMPARISON:  05/21/2024 FINDINGS: Stable chronic elevation of the right hemidiaphragm. Right Port-A-Cath remains in place, unchanged. Cardiomediastinal contours are  unchanged. No confluent airspace opacities, effusions or edema. No acute bony abnormality. IMPRESSION: No active disease. Electronically Signed   By: Franky Crease M.D.   On: 05/31/2024 00:20     The length of time of the face-to-face encounter was 50 minutes. More than 50% of time was spent counseling and coordination of care.     Thank you for this referral.

## 2024-06-21 NOTE — Progress Notes (Signed)
 ANTICOAGULATION CONSULT NOTE Pharmacy Consult for Enoxaparin  Indication: CVA/PE/splenic infarct Brief A/P:   Allergies  Allergen Reactions   Hydrocodone Shortness Of Breath, Dermatitis and Other (See Comments)    I forget to breathe.   Clopidogrel Rash and Dermatitis    Patient Measurements: Height: 5' 2 (157.5 cm) Weight: 81 kg (178 lb 9.2 oz) IBW/kg (Calculated) : 50.1 Heparin  Dosing Weight: 68.8 kg  Vital Signs: Temp: 98.9 F (37.2 C) (10/19 0730) Temp Source: Oral (10/19 0730) BP: 130/50 (10/19 0730) Pulse Rate: 70 (10/19 0730)  Labs: Recent Labs    06/19/24 1004 06/19/24 1717 06/19/24 1717 06/19/24 2111 06/20/24 0115 06/20/24 1249 06/20/24 2232 06/21/24 0430  HGB 10.1*  --   --   --  9.2*  --   --  9.1*  HCT 32.0*  --   --   --  29.6*  --   --  28.8*  PLT 310  --   --   --  284  --   --  300  APTT  --  37*   < >  --  55* 65* 68* 67*  HEPARINUNFRC  --  >1.10*   < >  --  >1.10*  --  >1.10* 0.86*  CREATININE 0.64  --   --   --  0.57  --   --  0.63  TROPONINIHS  --  16  --  18*  --   --   --   --    < > = values in this interval not displayed.    Estimated Creatinine Clearance: 72.9 mL/min (by C-G formula based on SCr of 0.63 mg/dL).   Assessment: 63 y.o. female with h/o CVA on Eliquis, found to have new PE and splenic infarct, Eliquis now on hold, for heparin .  Consulted to transition from heparin  to treatment dose enoxaparin .   Hgb 9.1 and PLT 300. SCr at baseline. No bleeding per RN.   Plan:  Discontinue heparin  infusion Start enoxaparin  80 mg Q12H Continue to monitor H&H, platelets, and renal function  Thank you for allowing pharmacy to be a part of this patient's care   Prentice DOROTHA Favors, PharmD PGY1 Health-System Pharmacy Administration and Leadership Resident [facility]  06/21/2024 10:45 AM

## 2024-06-21 NOTE — Progress Notes (Signed)
 PROGRESS NOTE    Mesa Janus  FMW:[id number] DOB: 1961-07-01 DOA: 06/19/2024 PCP: [facility]   Chief Complaint  Patient presents with   Altered Mental Status    Brief Narrative:    Brenda [name] is a 64 y.o. 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   Assessment & Plan:   Principal Problem:   Acute metabolic encephalopathy    Acute metabolic encephalopathy -Very likely in the setting of UTI. - As well in the setting of recent CVA - CT head with no acute findings. - Right brain with expected evolution of recent embolic CVA, chronic to subacute, but no acute findings -EEG with no evidence of seizures - As well her altered mentation significant mainly for visual hallucinations, where she is seeing some family members, she reports she acknowledges that she knows they are far but she still sees them, report and missed them, as well yesterday during daytime he reports he could not see her husband, and once asked how is it possible, reports that she feels that he is busy with other things, that is why she cannot see him, and certainly she appears more stressed, more depressed tearful occasionally, was started on low-dose mirtazapine yesterday which she reports helped her with sleep, help her appetite, but I have discussed with her and husband, certainly she will benefit from psychiatric consult see if they can  recommend any new medication  UTI -Reports polyuria, has suprapubic tenderness, positive UA -Continue with IV Rocephin  -Follow on urine cultures    Acute PE, RLL  - incidental finding on CT A/P - Has diagnosis of acute DVT during recent hospitalization -Remains on heparin  drip, she is with known hypercoagulable status with malignancy during recent hospitalization started on Eliquis, difficult to tell if this is Eliquis failure or something she had during previous admission, discussed with hematology/oncology, mentation is to initiate Lovenox  treatment dose.  Splenic infarct  - Splenic infarct was not present on previous CT abdomen pelvis 05/04/2024, but still difficult to know if this is her Eliquis or prior to Eliquis treatment - On heparin  gtt. on admission, transition to Lovenox  treatment dose.   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 - Her residual stroke symptoms seem to be having some recrudence in setting of acute illness.  --Continue Lipitor  --Monitor neuro status -MRI with no acute findings   Type 2 diabetes, insulin -dependent, with hyperglycemia Last A1c 7.2% --Sliding scale Novolog  --Resume basal at home dose 16 units QHS --CBG's & hypoglycemia protocol --Titrate regimen, CBGs elevated, will add Premeal NovoLog , change Semglee  to 18 units daytime.   Metastatic endometrial cancer  -  on active chemo. Follows with Dr. Lonn --Continue home pain regimen --IV morphine  for breakthrough pain   Hx of CAD  - sp PCI in 2021.  Stable, no chest pain  Chronic HFpEF --Effient  was dc'd for Eliquis in setting of her stroke in  October --Monitor volume status --EKG and troponin if chest pain --Continue Lipitor  --Heparin  as above for now   Chronic respiratory failure with hypoxia - stable on baseline 2 L/min O2 --Continue O2   OSA -- nightly CPAP ordered    Depression  - continue Prozac .  Wellbutrin  has been on hold since prior d/c, will hold.      DVT prophylaxis: (Heparin  GTT Code Status: (Full) Family Communication: (Husband at bedside) Disposition:   Status is: Inpatient    Consultants:  Hematology/oncology  Subjective:  Reports good night sleep, good appetite, she still reports visual hallucinations,  Objective: Vitals:   06/20/24 2356 06/21/24 0345 06/21/24 0730 06/21/24 1139  BP: 122/72 (!) 148/69 (!) 130/50 (!) 128/116  Pulse:  86 70 93  Resp: 20 (!) 21 19 (!) 22  Temp: 98 F (36.7 C) 97.6 F (36.4 C) 98.9 F (37.2 C) 97.9 F (36.6 C)  TempSrc: Oral Oral Oral Oral  SpO2:  100%  95%  Weight:      Height:        Intake/Output Summary (Last 24 hours) at 06/21/2024 1302 Last data filed at 06/21/2024 0024 Gross per 24 hour  Intake --  Output 400 ml  Net -400 ml   Filed Weights   06/19/24 1656 06/19/24 2101  Weight: 83.2 kg 81 kg    Examination:  Awake Alert, Oriented X 3, appears in depressed mood, tearful occasionally Symmetrical Chest wall movement, Good air movement bilaterally, RRR,No Gallops,Rubs or new Murmurs, No Parasternal Heave +ve B.Sounds, Abd Soft, No tenderness, No rebound - guarding or rigidity. No Cyanosis, Clubbing or edema, No new Rash or bruise       Data Reviewed: I have personally reviewed following labs and imaging studies  CBC: Recent Labs  Lab 06/19/24 1004 06/20/24 0115 06/21/24 0430  WBC 11.6* 10.6* 7.9  HGB 10.1* 9.2* 9.1*  HCT 32.0* 29.6* 28.8*  MCV 85.6 86.5 86.2  PLT 310 284 300    Basic Metabolic Panel: Recent Labs  Lab 06/19/24 1004 06/20/24 0115 06/21/24 0430  NA 131* 134* 133*  K 3.9 3.9 4.0  CL 94* 98 97*  CO2 21* 23 25  GLUCOSE 236* 125* 300*  BUN 9 7* 8  CREATININE 0.64 0.57 0.63  CALCIUM  9.1 8.7* 8.8*  MG  --  1.0* 1.3*  PHOS  --  3.9 3.2    GFR: Estimated Creatinine Clearance: 72.9 mL/min (by C-G formula based on SCr of 0.63  mg/dL).  Liver Function Tests: Recent Labs  Lab 06/19/24 1004  AST 15  ALT 10  ALKPHOS 77  BILITOT 0.6  PROT 6.9  ALBUMIN 2.9*    CBG: Recent Labs  Lab 06/20/24 1124 06/20/24 1536 06/20/24 2120 06/21/24 0730 06/21/24 1212  GLUCAP 197* 292* 340* 298* 352*     Recent Results (from the past 240 hours)  Urine Culture     Status: Abnormal (Preliminary result)   Collection Time: 06/19/24  6:02 PM   Specimen: Urine, Clean Catch  Result Value Ref Range Status   Specimen Description URINE, CLEAN CATCH  Final   Special Requests NONE  Final   Culture (A)  Final    >=100,000 COLONIES/mL GRAM NEGATIVE RODS SUSCEPTIBILITIES TO FOLLOW Performed at [facility], [address], [place], [place] [address]    Report Status PENDING  Incomplete         Radiology Studies: EEG adult Result Date: 06/21/2024 Shelton Arlin KIDD, MD     06/21/2024  9:56  AM Patient Name: Kimori Tartaglia MRN: [id number] Epilepsy Attending: Arlin MALVA Krebs Referring Physician/Provider: Sherlon Brayton RAMAN, MD Date: 06/21/2024 Duration: 22.24 mins Patient history: 63yo F with ams. EEG to evaluate for seizure Level of alertness: Awake AEDs during EEG study: None Technical aspects: This EEG study was done with scalp electrodes positioned according to the 10-20 International system of electrode placement. Electrical activity was reviewed with band pass filter of 1-70Hz , sensitivity of 7 uV/mm, display speed of 70mm/sec with a 60Hz  notched filter applied as appropriate. EEG data were recorded continuously and digitally stored.  Video monitoring was available and reviewed as appropriate. Description: The posterior dominant rhythm consists of 8-9 Hz activity of moderate voltage (25-35 uV) seen predominantly in posterior head regions, symmetric and reactive to eye opening and eye closing. EEG showed intermittent generalized 5 to 6 Hz theta slowing. Hyperventilation and photic stimulation were not performed.    ABNORMALITY - Intermittent slow, generalized IMPRESSION: This study is suggestive of mild generalized non specific cerebral dysfunction (encephalopathy). No seizures or epileptiform discharges were seen throughout the recording. Arlin MALVA Krebs   MR BRAIN WO CONTRAST Result Date: 06/20/2024 EXAM: MRI BRAIN WITHOUT CONTRAST 06/20/2024 11:36:24 PM TECHNIQUE: Multiplanar multisequence MRI of the head/brain was performed without the administration of intravenous contrast. COMPARISON: Head CT from 06/19/2024 as well as recent MRI from 05/31/2024. CLINICAL HISTORY: Mental status change, unknown cause; recent embolic stroke, with worsening mental state. FINDINGS: BRAIN AND VENTRICLES: There has been interval evolution of previously identified embolic infarcts involving the anterior and posterior circulation now subacute in appearance. Persistent diffusion signal abnormality about several of these infarcts, while several others have resolved since prior. Associated T2/FLAIR signal abnormality about the subacute right PCA territory infarct without significant regional mass effect or edema. No associated hemorrhage. No new areas of acute or interval infarction. No mass. No midline shift. No hydrocephalus. The sella is unremarkable. Normal flow voids. ORBITS: No acute abnormality. SINUSES AND MASTOIDS: Small bilateral mastoid effusions noted. Nasopharynx unremarkable. BONES AND SOFT TISSUES: Normal marrow signal. No acute soft tissue abnormality. IMPRESSION: 1. Interval evolution of previously identified embolic infarcts involving the anterior and posterior circulations, now subacute in appearance. No significant associated mass effect or evidence for hemorrhagic transformation. 2. No new acute or interval infarction. 3. No other acute intracranial abnormality. Electronically signed by: Morene Hoard MD 06/20/2024 11:49 PM EDT RP Workstation: [id number]   CT ABDOMEN PELVIS W CONTRAST Result Date:  06/19/2024 CLINICAL DATA:  Abdominal pain, acute, nonlocalized EXAM: CT ABDOMEN AND PELVIS WITH CONTRAST TECHNIQUE: Multidetector CT imaging of the abdomen and pelvis was performed using the standard protocol following bolus administration of intravenous contrast. RADIATION DOSE REDUCTION: This exam was performed according to the departmental dose-optimization program which includes automated exposure control, adjustment of the mA and/or kV according to patient size and/or use of iterative reconstruction technique. CONTRAST:  75mL OMNIPAQUE  IOHEXOL  350 MG/ML SOLN COMPARISON:  05/04/2024 FINDINGS: Lower chest: No focal airspace consolidation or pleural effusion.Incidentally noted, proximal segmental pulmonary emboli in the right lower lobe. Dense atherosclerosis and possible stenting of the LAD. Hepatobiliary: No mass.Cholecystectomy. Mild dilation of the intrahepatic and extrahepatic bile ducts, likely related to the prior cholecystectomy. The portal veins are patent. Pancreas: Diffuse fatty atrophy of the pancreatic parenchyma. No mass or ductal dilation. No peripancreatic inflammation or fluid collection. Spleen: Wedge-shaped region of hypoattenuation in the posteromedial aspect of the spleen, measuring up to 1.7 cm, likely splenic infarct. Adrenals/Urinary Tract: No adrenal masses. Similar mass effect and  anterior displacement of the left kidney. No renal mass. No nephrolithiasis or hydronephrosis. Partially distended urinary bladder without visualized abnormality. Stomach/Bowel: The stomach is decompressed without focal abnormality. 2.1 cm periampullary duodenum diverticulum. No small bowel wall thickening or inflammation. No small bowel obstruction.Normal appendix. Vascular/Lymphatic: No aortic aneurysm. Scattered aortoiliac atherosclerosis. Subcentimeter paraesophageal lymph node along the right ward aspect of the distal esophagus measuring 7 mm, unchanged. In the posteromedial, left pararenal space, there is  a large centrally necrotic mass with peripheral enhancement measuring 8.5 x 7.5 x 10.2 cm (APxTRxCC), mostly centered within the left psoas musculature, causing significant mass effect on the posteromedial aspect of the left kidney. The mass at least abuts the posterolateral wall of the infrarenal aorta and the left renal collecting system at the UPJ and proximal ureteral segment. Subtle erosive changes of the leftward anterior aspect of the L2 vertebral body (axial 46), unchanged. Interval decrease in size of the left external iliac chain lymph node (axial 73), now measuring 0.8 cm (previously 1.8 cm). Reproductive: Hysterectomy. No concerning adnexal mass.No free pelvic fluid. Other: No pneumoperitoneum or ascites.  Mild anasarca. Musculoskeletal: No acute fracture or destructive lesion. Multilevel degenerative disc disease of the spine. IMPRESSION: 1. Incidentally noted proximal segmental pulmonary emboli in the right lower lobe. No findings to suggest right heart strain. 2. Small wedge-shaped region of hypoattenuation in the posteromedial aspect of the spleen measuring up to 1.7 cm, consistent with a splenic infarct. 3. Overall, similar size and appearance of the centrally necrotic, infiltrative mass in the left psoas muscle, measuring 8.5 x 7.5 x 10.2 cm. The mass abuts the left renal collecting system, without associated hydronephrosis. 4. Decrease in size of the left external iliac chain lymph node (axial 73), now measuring 0.8 cm (previously, 1.8 cm). Critical Value/emergent results were called by telephone at the time of interpretation on 06/19/2024 at 4:09 pm to provider [facility], who verbally acknowledged these results. Aortic Atherosclerosis (ICD10-I70.0). Electronically Signed   By: Rogelia Myers M.D.   On: 06/19/2024 16:15   CT Head Wo Contrast Result Date: 06/19/2024 CLINICAL DATA:  Recent stroke, increased confusion EXAM: CT HEAD WITHOUT CONTRAST TECHNIQUE: Contiguous axial images  were obtained from the base of the skull through the vertex without intravenous contrast. RADIATION DOSE REDUCTION: This exam was performed according to the departmental dose-optimization program which includes automated exposure control, adjustment of the mA and/or kV according to patient size and/or use of iterative reconstruction technique. COMPARISON:  MRI 05/31/2024 FINDINGS: CT HEAD: There is an evolving infarct in the right posterior and medial temporal lobe with some cortical mineralization. There is no hemorrhage. No new ischemic changes. No mass lesion. The ventricles are normal. Skull/sinuses/orbits: No significant abnormality. IMPRESSION: Evolving infarct in the right posterior and medial temporal lobe No new infarct. Electronically Signed   By: Nancyann Burns M.D.   On: 06/19/2024 16:00        Scheduled Meds:  atorvastatin   80 mg Oral QPM   Chlorhexidine  Gluconate Cloth  6 each Topical Daily   enoxaparin  (LOVENOX ) injection  1 mg/kg Subcutaneous Q12H   feeding supplement (GLUCERNA SHAKE)  237 mL Oral TID BM   FLUoxetine   40 mg Oral Daily   insulin  aspart  0-5 Units Subcutaneous QHS   insulin  aspart  0-9 Units Subcutaneous TID WC   insulin  aspart  4 Units Subcutaneous TID WC   insulin  glargine-yfgn  18 Units Subcutaneous Daily   lidocaine   1 patch Transdermal Q24H   mirtazapine  15 mg Oral QHS   sodium chloride  flush  10-40 mL Intracatheter Q12H   Continuous Infusions:  cefTRIAXone  (ROCEPHIN )  IV 1 g (06/20/24 1634)     LOS: 2 days     Brayton Lye, MD [facility]   To contact the attending provider between 7A-7P or the covering provider during after hours 7P-7A, please log into the web site [email or web address] and access using universal [place] password for that web site. If you do not have the password, please call the hospital operator.  06/21/2024, 1:02 PM

## 2024-06-21 NOTE — Procedures (Signed)
 Patient Name: Myleka Moncure  MRN: [id number]  Epilepsy Attending: Arlin MALVA Krebs  Referring Physician/Provider: Sherlon Brayton RAMAN, MD  Date: 06/21/2024 Duration: 22.24 mins  Patient history: 63yo F with ams. EEG to evaluate for seizure  Level of alertness: Awake  AEDs during EEG study: None  Technical aspects: This EEG study was done with scalp electrodes positioned according to the 10-20 International system of electrode placement. Electrical activity was reviewed with band pass filter of 1-70Hz , sensitivity of 7 uV/mm, display speed of 60mm/sec with a 60Hz  notched filter applied as appropriate. EEG data were recorded continuously and digitally stored.  Video monitoring was available and reviewed as appropriate.  Description: The posterior dominant rhythm consists of 8-9 Hz activity of moderate voltage (25-35 uV) seen predominantly in posterior head regions, symmetric and reactive to eye opening and eye closing. EEG showed intermittent generalized 5 to 6 Hz theta slowing. Hyperventilation and photic stimulation were not performed.     ABNORMALITY - Intermittent slow, generalized  IMPRESSION: This study is suggestive of mild generalized non specific cerebral dysfunction (encephalopathy). No seizures or epileptiform discharges were seen throughout the recording.  Oneda Duffett O Deshonna Trnka

## 2024-06-21 NOTE — Progress Notes (Signed)
 EEG complete - results pending

## 2024-06-21 NOTE — Progress Notes (Signed)
 ANTICOAGULATION CONSULT NOTE  Pharmacy Consult for Heparin  Indication: CVA/PE/splenic infarct   Allergies  Allergen Reactions   Hydrocodone Shortness Of Breath, Dermatitis and Other (See Comments)    I forget to breathe.   Clopidogrel Rash and Dermatitis    Patient Measurements: Height: 5' 2 (157.5 cm) Weight: 81 kg (178 lb 9.2 oz) IBW/kg (Calculated) : 50.1 Heparin  Dosing Weight: 68.8 kg  Vital Signs: Temp: 97.7 F (36.5 C) (10/18 1934) Temp Source: Oral (10/18 1934) BP: 139/43 (10/18 1934) Pulse Rate: 80 (10/18 1535)  Labs: Recent Labs    06/19/24 1004 06/19/24 1717 06/19/24 1717 06/19/24 2111 06/20/24 0115 06/20/24 1249 06/20/24 2232  HGB 10.1*  --   --   --  9.2*  --   --   HCT 32.0*  --   --   --  29.6*  --   --   PLT 310  --   --   --  284  --   --   APTT  --  37*   < >  --  55* 65* 68*  HEPARINUNFRC  --  >1.10*  --   --  >1.10*  --  >1.10*  CREATININE 0.64  --   --   --  0.57  --   --   TROPONINIHS  --  16  --  18*  --   --   --    < > = values in this interval not displayed.    Estimated Creatinine Clearance: 72.9 mL/min (by C-G formula based on SCr of 0.57 mg/dL).   Assessment: 63 y.o. 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 Heparin  level not ordered.  Hgb 9.2 and PLT 284. No infusion issues or signs of bleeding per RN.  10/19 AM update:  aPTT therapeutic  Goal of Therapy:  Heparin  level 0.3-0.7 units/ml aPTT 66-102 seconds Monitor platelets by anticoagulation protocol: Yes   Plan:  Cont heparin  infusion at 1450 units/hr  Heparin  level and aPTT with AM labs Continue to monitor H&H and platelets   Lynwood Mckusick, PharmD, BCPS Clinical Pharmacist Phone: [phone]

## 2024-06-21 NOTE — Plan of Care (Signed)
  Problem: Coping: Goal: Ability to adjust to condition or change in health will improve Outcome: Progressing   Problem: Fluid Volume: Goal: Ability to maintain a balanced intake and output will improve Outcome: Progressing   Problem: Health Behavior/Discharge Planning: Goal: Ability to identify and utilize available resources and services will improve Outcome: Progressing   Problem: Nutritional: Goal: Progress toward achieving an optimal weight will improve Outcome: Progressing   Problem: Skin Integrity: Goal: Risk for impaired skin integrity will decrease Outcome: Progressing

## 2024-06-21 NOTE — Evaluation (Signed)
 Physical Therapy Evaluation Patient Details Name: Brenda [name] MRN: [id number] DOB: [birth year] Today's Date: 06/21/2024  History of Present Illness  63 y.o. female presents to [facility] hospital on 05/29/24 for AMS. EEG r/o seizures negative for acute processes. 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 PMH includes HTN, lipidemia, DM, CAD, CHF, anxiety, depression, OSA, chronic respiratory failure, metastatic endometrial carcinoma.  Clinical Impression  Pt was able to go home after AIR and was ambulating with Rollator in her home and requiring assistance for iADLs. Pt is currently limited in poor mobility by back pain, and poor cognition, especially with safety and problem solving sequencing. Pt supervision for bed mobility, contact guard for safety with transfers and contact guard to min A for ambulation. Pt does require maximal cuing assistance for navigation of RW around obstacles, unable to problem solve adequately to navigate RW wheels around obstacles on floor. PT recommending 24 hour supervision and resumption of HHPT. If 24 hour assist is not available pt maybenefit from continued inpatient follow up therapy, <3 hours/day. PT will continue to follow and refer to Mobility Specialist.          If plan is discharge home, recommend the following: A little help with walking and/or transfers;A little help with bathing/dressing/bathroom;Assistance with cooking/housework;Assist for transportation;Help with stairs or ramp for entrance   Can travel by private vehicle   Yes    Equipment Recommendations None recommended by PT     Functional Status Assessment Patient has had a recent decline in their functional status and demonstrates the ability to make significant improvements in function in a reasonable and predictable amount of time.     Precautions / Restrictions Precautions Precautions: Fall Recall of Precautions/Restrictions: Intact Restrictions Weight Bearing  Restrictions Per Provider Order: No      Mobility  Bed Mobility Overal bed mobility: Needs Assistance Bed Mobility: Supine to Sit     Supine to sit: Supervision     General bed mobility comments: able to come to longsitting from flattened bed, before PT is ready tries to come to EoB between bed rail and footboard    Transfers Overall transfer level: Needs assistance Equipment used: Rollator (4 wheels) Transfers: Sit to/from Stand Sit to Stand: Contact guard assist           General transfer comment: good power up, vc for hand placement on RW, and management of lines prior to beginning ambulation    Ambulation/Gait Ambulation/Gait assistance: Contact guard assist, Min assist Gait Distance (Feet): 15 Feet Assistive device: Rollator (4 wheels), Rolling walker (2 wheels) Gait Pattern/deviations: Step-through pattern, Decreased stride length Gait velocity: reduced Gait velocity interpretation: <1.31 ft/sec, indicative of household ambulator   General Gait Details: contact guard for safety with mobilization, pt with poor awareness of RW location, cues for scanning environment to keep wheels from getting stuck on obstacles in room. difficulty with orchestrating pivot in front of recliner, when she turns, as she would sit on recliner arm, difficulty understanding need to step in front of recliner, untils she sits on recliner arm      Modified Rankin (Stroke Patients Only) Modified Rankin (Stroke Patients Only) Pre-Morbid Rankin Score: No significant disability Modified Rankin: Moderately severe disability     Balance Overall balance assessment: Needs assistance Sitting-balance support: Feet supported Sitting balance-Leahy Scale: Good Sitting balance - Comments: no LOB seated EOB   Standing balance support: Bilateral upper extremity supported, During functional activity Standing balance-Leahy Scale: Poor Standing balance comment: difficulty with keeping hands on  RW  grips for support               High Level Balance Comments: needs tactile cues for balance with ambulation             Pertinent Vitals/Pain Pain Assessment Pain Assessment: 0-10 Pain Score: 4  Faces Pain Scale: No hurt Pain Location: lower back and L hip Pain Descriptors / Indicators: Aching, Sore Pain Intervention(s): Limited activity within patient's tolerance, Monitored during session, Patient requesting pain meds-RN notified    Home Living Family/patient expects to be discharged to:: Private residence Living Arrangements: Spouse/significant other;Children Available Help at Discharge: Family Type of Home: House Home Access: Ramped entrance       Home Layout: One level Home Equipment: Rollator (4 wheels);BSC/3in1;Shower seat;Grab bars - toilet;Grab bars - tub/shower (transport chair) Additional Comments: did not use AD in the home but was using w/c and rollator in the community; on 2L O2    Prior Function Prior Level of Function : History of Falls (last six months);Independent/Modified Independent;Driving             Mobility Comments: Rollator in home ADLs Comments: independent with ADLs, family assists wtih iADLs.     Extremity/Trunk Assessment   Upper Extremity Assessment Upper Extremity Assessment: Defer to OT evaluation    Lower Extremity Assessment Lower Extremity Assessment: Generalized weakness    Cervical / Trunk Assessment Cervical / Trunk Assessment: Normal  Communication   Communication Communication: No apparent difficulties Factors Affecting Communication: Difficulty expressing self (intermittent)    Cognition Arousal: Alert Behavior During Therapy: WFL for tasks assessed/performed   PT - Cognitive impairments: Awareness, Memory, Attention, Problem solving, Safety/Judgement                       PT - Cognition Comments: poor awareness of her decreased ability and associated safety, difficulty sequencing herself to keep  her RW from getting caught on the closet, even with cues for scanning Following commands: Impaired Following commands impaired: Follows multi-step commands inconsistently, Follows one step commands inconsistently     Cueing Cueing Techniques: Verbal cues, Tactile cues, Visual cues, Gestural cues     General Comments General comments (skin integrity, edema, etc.): VSS on 2L O2 via [place], husband in room        Assessment/Plan    PT Assessment Patient needs continued PT services  PT Problem List Decreased strength;Decreased activity tolerance;Decreased balance;Decreased mobility;Decreased knowledge of use of DME;Decreased knowledge of precautions       PT Treatment Interventions DME instruction;Gait training;Functional mobility training;Therapeutic activities;Therapeutic exercise;Balance training;Neuromuscular re-education;Cognitive remediation;Patient/family education    PT Goals (Current goals can be found in the Care Plan section)  Acute Rehab PT Goals Patient Stated Goal: less falling PT Goal Formulation: With patient/family Time For Goal Achievement: 07/05/24 Potential to Achieve Goals: Fair    Frequency Min 3X/week        AM-PAC PT 6 Clicks Mobility  Outcome Measure Help needed turning from your back to your side while in a flat bed without using bedrails?: A Little Help needed moving from lying on your back to sitting on the side of a flat bed without using bedrails?: A Little Help needed moving to and from a bed to a chair (including a wheelchair)?: A Little Help needed standing up from a chair using your arms (e.g., wheelchair or bedside chair)?: A Little Help needed to walk in hospital room?: A Little Help needed climbing 3-5 steps with a railing? :  A Lot 6 Click Score: 17    End of Session Equipment Utilized During Treatment: Gait belt;Oxygen  Activity Tolerance: Patient tolerated treatment well Patient left: in chair;with call bell/phone within reach;with  family/visitor present;with chair alarm set Nurse Communication: Mobility status PT Visit Diagnosis: Other abnormalities of gait and mobility (R26.89);Muscle weakness (generalized) (M62.81);Other symptoms and signs involving the nervous system (R29.898);Unsteadiness on feet (R26.81);History of falling (Z91.81)    Time: 9064-9053 [id number] PT Time Calculation (min) (ACUTE ONLY): 28 min   Charges:   PT Evaluation $PT Eval Moderate Complexity: 1 Mod PT Treatments $Therapeutic Activity: 8-22 mins PT General Charges $$ ACUTE PT VISIT: 1 Visit         Shina Wass B. Fleeta Lapidus PT, DPT Acute Rehabilitation Services Please use secure chat or  Call Office [phone]   Almarie KATHEE Fleeta Fleet 06/21/2024, 10:35 AM

## 2024-06-21 NOTE — Progress Notes (Signed)
   06/21/24 2236  BiPAP/CPAP/SIPAP  BiPAP/CPAP/SIPAP Pt Type Adult  BiPAP/CPAP/SIPAP Resmed  Mask Type Full face mask  Mask Size Small  Respiratory Rate 16 breaths/min  EPAP 8 cmH2O  Flow Rate 2 lpm  Patient Home Machine No  Patient Home Mask No  Patient Home Tubing No  Auto Titrate No

## 2024-06-21 NOTE — Progress Notes (Signed)
 ANTICOAGULATION CONSULT NOTE Pharmacy Consult for Heparin  Indication: CVA/PE/splenic infarct Brief A/P: aPTT subtherapeutic Increase Heparin  rate  Allergies  Allergen Reactions   Hydrocodone Shortness Of Breath, Dermatitis and Other (See Comments)    I forget to breathe.   Clopidogrel Rash and Dermatitis    Patient Measurements: Height: 5' 2 (157.5 cm) Weight: 81 kg (178 lb 9.2 oz) IBW/kg (Calculated) : 50.1 Heparin  Dosing Weight: 68.8 kg  Vital Signs: Temp: 98.9 F (37.2 C) (10/19 0730) Temp Source: Oral (10/19 0730) BP: 130/50 (10/19 0730) Pulse Rate: 70 (10/19 0730)  Labs: Recent Labs    06/19/24 1004 06/19/24 1717 06/19/24 1717 06/19/24 2111 06/20/24 0115 06/20/24 1249 06/20/24 2232 06/21/24 0430  HGB 10.1*  --   --   --  9.2*  --   --  9.1*  HCT 32.0*  --   --   --  29.6*  --   --  28.8*  PLT 310  --   --   --  284  --   --  300  APTT  --  37*   < >  --  55* 65* 68* 67*  HEPARINUNFRC  --  >1.10*   < >  --  >1.10*  --  >1.10* 0.86*  CREATININE 0.64  --   --   --  0.57  --   --  0.63  TROPONINIHS  --  16  --  18*  --   --   --   --    < > = values in this interval not displayed.    Estimated Creatinine Clearance: 72.9 mL/min (by C-G formula based on SCr of 0.63 mg/dL).   Assessment: 63 y.o. female with h/o CVA on Eliquis, found to have new PE and splenic infarct, Eliquis now on hold, for heparin   APTT of 67 which is therapeutic on 1450 units/hr. Heparin  level of 0.86. APTT and heparin  level not yet correlating. Hgb 9.1 and PLT 300. No infusion issues or signs of bleeding per RN.  Will increase heparin  slightly since pt is at lower end of goal.   Goal of Therapy:  Heparin  level 0.3-0.7 units/ml aPTT 66-102 seconds Monitor platelets by anticoagulation protocol: Yes   Plan:  Increase heparin  infusion at 1500 units/hr  Check heparin  level and APTT in 6 hours and daily while on heparin   Continue to monitor H&H and platelets   Thank you for allowing  pharmacy to be a part of this patient's care   Prentice DOROTHA Favors, PharmD PGY1 Health-System Pharmacy Administration and Leadership Resident [facility]  06/21/2024 7:48 AM

## 2024-06-21 NOTE — Evaluation (Signed)
 Clinical/Bedside Swallow Evaluation Patient Details  Name: Brenda [name] MRN: [id number] Date of Birth: 1960/12/28  Today's Date: 06/21/2024 Time: SLP Start Time (ACUTE ONLY): 1410 SLP Stop Time (ACUTE ONLY): 1427 SLP Time Calculation (min) (ACUTE ONLY): 17 min  Past Medical History:  Past Medical History:  Diagnosis Date   Anemia    Arthritis    back, hands, hips (02/22/2015)   CAD (coronary artery disease)    a. complex LAD/diagonal bifurcation PCI in 2010. b. STEMI 10/2019 s/p PTCA/DES x1 to mLAD overlapping the old stent, residual disease treated medicaly.   Cancer [facility])    uterine   CHF (congestive heart failure) (HCC)    Depression    Diabetes mellitus (HCC)    started when I was pregnant; not sure if it was type 1 or type 2    History of radiation therapy    Endometrium- HDR 01/22/22-03/07/22- Dr. Lynwood Nasuti   Hypercholesterolemia    Hypertension    Morbid obesity (HCC)    Myocardial infarction (HCC)    mild x 3   Neuromuscular disorder (HCC)    neuropathy feet   Sleep apnea    cpap/   Past Surgical History:  Past Surgical History:  Procedure Laterality Date   CARDIAC CATHETERIZATION  12/02/2012   CHOLECYSTECTOMY OPEN  09/04/1987   CORONARY ANGIOPLASTY WITH STENT PLACEMENT  09/03/2009   CORONARY/GRAFT ACUTE MI REVASCULARIZATION N/A 10/26/2019   Procedure: CORONARY/GRAFT ACUTE MI REVASCULARIZATION;  Surgeon: Verlin Lonni BIRCH, MD;  Location: [facility] INVASIVE CV LAB;  Service: Cardiovascular;  Laterality: N/A;   INCISIONAL HERNIA REPAIR N/A 12/09/2021   Procedure: LAPAROSCOPIC REPAIR UMBILICAL HERNIA WITH MESH;  Surgeon: Vernetta Berg, MD;  Location: [facility] ORS;  Service: General;  Laterality: N/A;   IR IMAGING GUIDED PORT INSERTION  04/08/2024   LEFT HEART CATH AND CORONARY ANGIOGRAPHY N/A 10/26/2019   Procedure: LEFT HEART CATH AND CORONARY ANGIOGRAPHY;  Surgeon: Verlin Lonni BIRCH, MD;  Location: [facility] INVASIVE CV LAB;  Service: Cardiovascular;   Laterality: N/A;   LEFT HEART CATHETERIZATION WITH CORONARY ANGIOGRAM N/A 12/25/2012   Procedure: LEFT HEART CATHETERIZATION WITH CORONARY ANGIOGRAM;  Surgeon: Ozell Fell, MD;  Location[facility] CATH LAB;  Service: Cardiovascular;  Laterality: N/A;   RADIOLOGY WITH ANESTHESIA N/A 04/21/2024   Procedure: MRI WITH ANESTHESIA;  Surgeon: Radiologist, Medication, MD;  Location: [facility] OR;  Service: Radiology;  Laterality: N/A;  BRAIN W/ W/O   ROBOTIC ASSISTED TOTAL HYSTERECTOMY WITH BILATERAL SALPINGO OOPHERECTOMY N/A 12/06/2021   Procedure: XI ROBOTIC ASSISTED TOTAL HYSTERECTOMY WITH BILATERAL SALPINGO OOPHORECTOMY;  Surgeon: [name] SAUNDERS, MD;  Location: [facility] ORS;  Service: Gynecology;  Laterality: N/A;   UMBILICAL HERNIA REPAIR  05/04/1989   doesn't think they used mesh   HPI:  Patient is a 63 y.o. 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 She had recent admission following CVA (9/27-10/1/25) followed by CIR 10/2-10/10. She presented to ED on 10/17 with worsening confusion, visual hallucinations. Workup significant for UTI, finding of PE and splenic infarct. SLP ordered for swallow evaluation secondary to patient observed to have coughing when drinking water.    Assessment / Plan / Recommendation  Clinical Impression  Patient is not currently presenting with clinical s/s of dysphagia as per this bedside swallow evaluation. Of note, patient was assessed for swallow function on 06/04/24 when in CIR and no dysphagia suspected at that time. Patient indicated that  in the mornings, I get a deep cough in absence of PO's. Her spouse who was in the room told SLP that for the past two weeks she has had a little more frequent incidence of cough with PO's and patient added that she drinks, eats too quickly at times. SLP assessed her swallow via sips of thin  liquids while taking oral meds followed by consecutive straw sips of thin liquids. Patient with timely swallow initiation and no overt s/s aspiration. SLP not recommending any changes to her PO diet at this time but will plan to f/u at least once to ensure toleration given prior observations of her coughing when drinking water and spouse indicating she coughs a little more frequently when drinking liquids since CVA. SLP Visit Diagnosis: Dysphagia, unspecified (R13.10)    Aspiration Risk  No limitations    Diet Recommendation Regular;Thin liquid    Liquid Administration via: Cup;Straw Medication Administration: Whole meds with liquid Supervision: Patient able to self feed Compensations: Slow rate;Small sips/bites    Other  Recommendations Oral Care Recommendations: Oral care BID     Assistance Recommended at Discharge    Functional Status Assessment Patient has not had a recent decline in their functional status  Frequency and Duration min 1 x/week  1 week       Prognosis Prognosis for improved oropharyngeal function: Good      Swallow Study   General Date of Onset: 06/21/24 HPI: Patient is a 63 y.o. 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 She had recent admission following CVA (9/27-10/1/25) followed by CIR 10/2-10/10. She presented to ED on 10/17 with worsening confusion, visual hallucinations. Workup significant for UTI, finding of PE and splenic infarct. SLP ordered for swallow evaluation secondary to patient observed to have coughing when drinking water. Type of Study: Bedside Swallow Evaluation Previous Swallow Assessment: 06/04/24 BSE in CIR Diet Prior to this Study: Regular;Thin liquids (Level 0) Temperature Spikes Noted: No Respiratory Status: Nasal cannula History of Recent Intubation: No Behavior/Cognition:  Alert;Cooperative;Pleasant mood Oral Cavity Assessment: Within Functional Limits Oral Care Completed by SLP: No Oral Cavity - Dentition: Adequate natural dentition Vision: Functional for self-feeding Self-Feeding Abilities: Able to feed self Patient Positioning: Upright in bed Baseline Vocal Quality: Normal Volitional Cough: Strong Volitional Swallow: Able to elicit    Oral/Motor/Sensory Function Overall Oral Motor/Sensory Function: Within functional limits   Ice Chips     Thin Liquid Thin Liquid: Within functional limits Presentation: Straw    Nectar Thick     Honey Thick     Puree Puree: Not tested   Solid     Solid: Not tested     Norleen IVAR Blase, MA, CCC-SLP Speech Therapy

## 2024-06-21 NOTE — Consult Note (Addendum)
 [facility] Psychiatric Consult Initial  Patient Name: .Brenda [name]  MRN: [id number]  DOB: 24-Jan-1961  Consult Order details:  Orders (From admission, onward)     Start     Ordered   06/21/24 1105  IP CONSULT TO PSYCHIATRY       Comments: Patient with recent notes of endometrial cancer, she had actually stroke, but she presents with creased confusion, it does appear she is having worsening depression, and selective visual hallucination, indicative of increased stress, likely will need adding more medication to her depression meds.  Ordering Provider: Sherlon Brayton RAMAN, MD  Provider:  (Not yet assigned)  Question Answer Comment  Location [facility]   Reason for Consult? Worsening depression, hallucination.      06/21/24 1105             Mode of Visit: In person    Psychiatry Consult Evaluation  Service Date: June 21, 2024 LOS:  LOS: 2 days  Chief Complaint I have been seeing things lately and getting confused  Primary Psychiatric Diagnoses  Delusion likely due to UTI/metastatic cancer/medications 2.  Major depressive disorder likely secondary to multiple medical conditions  Assessment  Brenda [name] is a 63 y.o. female admitted: Medically on 06/19/2024  9:40 AM for worsening confusion. 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 Psychiatry consulted for Patient with recent notes of endometrial cancer, she had actually stroke, but she presents with creased confusion, it does appear she is having worsening depression, and selective visual hallucination, indicative of increased stress, likely will need adding more medication to her depression meds.  Her current presentation  of worsening confusion, visual hallucination and anxiety in the context of recent UTI and underlying metastatic cancer is most consistent with Delirium. She does not meet criteria for psychiatric inpatient admission based on absent suicidal or homicidal ideation, intent or plan, ability to participate in suicide risk assessment and support from her husband. Current outpatient psychotropic medications include Prozac  40 mg daily and Wellbutrin  XL 150 mg every morning and historically she has had a good response to these medications. She was compliant with medications prior to admission as evidenced by reports from patient. On initial examination, patient is awake, alert and oriented to time, place, person and situation. She is calm and cooperative with no psychomotor abnormalities. Her mood is good with appropriate affect. Patient is logical, coherent with goal directed thought process. She endorsed visual hallucination but denies suicidal or homicidal ideation, intent or plan. Patient has full insight into her mental health. Please see plan below for detailed recommendations.   Diagnoses:  Active Hospital problems: Principal Problem:   Acute metabolic encephalopathy    Plan   ## Psychiatric Medication Recommendations:  --Continue Prozac  40 mg daily for depression/anxiety. Note-Patient does not need additional Wellbutrin  at this time.  --Continue Hydroxyzine 10 mg three times daily for anxiety -Continue Remeron 15 mg at bedtime for sleep --Add Haldol 2 mg twice daily for psychosis. -Please avoid Benzodiazepine to prevent worsening of confusion/disinhibition -Correct electrolyte abnormality (Sodium-133, magnesium -1.3, calcium -8.8) and monitor glucose level appropriately -Continue other medical treatment as the primary team.  -psychiatry will follow up.    ## Medical Decision Making Capacity: Not specifically addressed in this encounter  ## Further  Work-up:  --  TSH, B12, folate -- most recent  EKG on 06/19/24 had QtC of 467 -- Pertinent labwork reviewed earlier this admission includes: Sodium-133, magnesium -1.3, calcium -8.8, Hb-9.1, cloudy urine, moderate leukocytes, and many bacteria.    ## Disposition:-- There are no psychiatric contraindications to discharge at this time  ## Behavioral / Environmental: -Delirium Precautions: Delirium Interventions for Nursing and Staff: - RN to open blinds every AM. - To Bedside: Glasses, hearing aide, and pt's own shoes. Make available to patients. when possible and encourage use. - Encourage po fluids when appropriate, keep fluids within reach. - OOB to chair with meals. - Passive ROM exercises to all extremities with AM & PM care. - RN to assess orientation to person, time and place QAM and PRN. - Recommend extended visitation hours with familiar family/friends as feasible. - Staff to minimize disturbances at night. Turn off television when pt asleep or when not in use.    ## Safety and Observation Level:  - Based on my clinical evaluation, I estimate the patient to be at low risk of self harm in the current setting. - At this time, we recommend  routine. This decision is based on my review of the chart including patient's history and current presentation, interview of the patient, mental status examination, and consideration of suicide risk including evaluating suicidal ideation, plan, intent, suicidal or self-harm behaviors, risk factors, and protective factors. This judgment is based on our ability to directly address suicide risk, implement suicide prevention strategies, and develop a safety plan while the patient is in the clinical setting. Please contact our team if there is a concern that risk level has changed.  CSSR Risk Category:C-SSRS RISK CATEGORY: No Risk  Suicide Risk Assessment: Patient has following modifiable risk factors for suicide: triggering events and pain, medical illness (ie new dx of cancer), which we are addressing by  recommending medical treatment and by prescribing medications. Patient has following non-modifiable or demographic risk factors for suicide:  Patient has the following protective factors against suicide: Supportive family, Cultural, spiritual, or religious beliefs that discourage suicide, and Minor children in the home  Thank you for this consult request. Recommendations have been communicated to the primary team.  We will follow up at this time.   Brenda DELENA Donath, MD       History of Present Illness  Relevant Aspects of [facility] Course:   Patient Report:  Patient seen face to face in her hospital room. She is awake, alert and oriented to time, place, person and situation. Patient does not appear confused and understands she's in [facility] hospital and was admitted due to recent confusion. Patient reports she was recently diagnosed with stroke, heart attack and metastatic endometrial cancer for which she has been receiving treatment. Patient also reports visual hallucination, seeing things others don't see and she has been getting more anxious lately, but denies auditory hallucinations, paranoia, idea of reference, thought insertion or thought broadcasting. She reports occasional thought of suicide due to feeling overwhelmed as a result of her multiple medical conditions but denies current suicidal thought, intent or plan. She denies any previous history of suicide or self injurious behavior and identifies the love of her adopted son and husband as the main reason to be alive. Patient reports getting adequate support from her husband even though they argue about things occasionally.   Patient denies depressed mood, lack of motivation, appetite problem, weight loss, hopelessness, or helplessness. She denies any previous psychiatric admission but currently takes  Prozac  40 mg daily and hydroxyzine 10 mg three times daily as needed for sleep and anxiety. Patient also states she was taking Wellbutrin   XL 150 mg daily but believes she has not been taking it on this admission. She reports drinking alcohol socially but denies use of any other reactional substances including marijuana, cocaine, crack, PCP, K2, or crystal meth.    Collateral information:  Patient does not give Clinical research associate to obtain consent from the husband.   Review of Systems  Psychiatric/Behavioral:  Positive for hallucinations. Negative for depression, substance abuse and suicidal ideas. The patient is nervous/anxious. The patient does not have insomnia.      Psychiatric and Social History  Psychiatric History:  Information collected from patient.  Prev Dx/Sx: major depressive disorder.  Current Psych Provider: none Home Meds (current): Prozac  40 mg daily and Wellbutrin  XL 150 mg daily Previous Med Trials: unknown  Therapy: yes, patient currently sees a therapist biweekly.   Prior Psych Hospitalization: patient denies   Prior Self Harm: patient denies  Prior Violence: patient denies   Family Psych History: denies  Family Hx suicide: denies   Social History:  Educational Hx: some college Occupational Yk:lwzfeonbzi, currently on disability benefit.  Legal Hx: denies  Living Situation: Lives with her husband and her adopted son Spiritual Hx: unsure Access to weapons/lethal means: patient denies access to firearm.    Substance History Alcohol: drinks socially  Type of alcohol wine Last Drink few days ago Number of drinks per day-once or twice per week History of alcohol withdrawal seizures denies  History of DT's denies  Tobacco: former smoker  Illicit drugs:denies  Prescription drug abuse:denies  Rehab yk:izwpzd   Exam Findings   Vital Signs:  Temp:  [97.5 F (36.4 C)-98.9 F (37.2 C)] 97.9 F (36.6 C) (10/19 1139) Pulse Rate:  [70-93] 93 (10/19 1139) Resp:  [15-22] 22 (10/19 1139) BP: (122-148)/(41-116) 128/116 (10/19 1139) SpO2:  [95 %-100 %] 95 % (10/19 1139) Blood pressure (!) 128/116, pulse 93,  temperature 97.9 F (36.6 C), temperature source Oral, resp. rate (!) 22, height 5' 2 (1.575 m), weight 81 kg, last menstrual period 10/30/2014, SpO2 95%. Body mass index is 32.66 kg/m.  Physical Exam  Mental Status Exam: General Appearance: Casual  Orientation:  Full (Time, Place, and Person)  Memory:  Immediate;   Good Recent;   Good Remote;   Good  Concentration:  Concentration: Good and Attention Span: Good  Recall:  Good  Attention  Good  Eye Contact:  Good  Speech:  Clear and Coherent  Language:  Good  Volume:  Normal  Mood: I feel good  Affect:  Appropriate and Congruent  Thought Process:  Coherent, Goal Directed, and Linear  Thought Content:  Hallucinations: Visual  Suicidal Thoughts:  No  Homicidal Thoughts:  No  Judgement:  Good  Insight:  Good  Psychomotor Activity:  Normal  Akathisia:  No  Fund of Knowledge:  Good      Assets:  Communication Skills Social Support  Cognition:  WNL  ADL's:  Impaired  AIMS (if indicated):        Other History   These have been pulled in through the EMR, reviewed, and updated if appropriate.  Family History:  The patient's family history includes COPD in her father; Cancer in her mother; Coronary artery disease in her mother and sister; Diabetes in her sister; Heart attack in her father; Hypertension in her mother; Other in her half-sister; Stroke in her sister; Thyroid  cancer in  her daughter.  Medical History: Past Medical History:  Diagnosis Date   Anemia    Arthritis    back, hands, hips (02/22/2015)   CAD (coronary artery disease)    a. complex LAD/diagonal bifurcation PCI in 2010. b. STEMI 10/2019 s/p PTCA/DES x1 to mLAD overlapping the old stent, residual disease treated medicaly.   Cancer [facility])    uterine   CHF (congestive heart failure) (HCC)    Depression    Diabetes mellitus (HCC)    started when I was pregnant; not sure if it was type 1 or type 2    History of radiation therapy    Endometrium- HDR  01/22/22-03/07/22- Dr. Lynwood Nasuti   Hypercholesterolemia    Hypertension    Morbid obesity (HCC)    Myocardial infarction (HCC)    mild x 3   Neuromuscular disorder (HCC)    neuropathy feet   Sleep apnea    cpap/    Surgical History: Past Surgical History:  Procedure Laterality Date   CARDIAC CATHETERIZATION  12/02/2012   CHOLECYSTECTOMY OPEN  09/04/1987   CORONARY ANGIOPLASTY WITH STENT PLACEMENT  09/03/2009   CORONARY/GRAFT ACUTE MI REVASCULARIZATION N/A 10/26/2019   Procedure: CORONARY/GRAFT ACUTE MI REVASCULARIZATION;  Surgeon: Verlin Lonni BIRCH, MD;  Location: [facility] INVASIVE CV LAB;  Service: Cardiovascular;  Laterality: N/A;   INCISIONAL HERNIA REPAIR N/A 12/09/2021   Procedure: LAPAROSCOPIC REPAIR UMBILICAL HERNIA WITH MESH;  Surgeon: Vernetta Berg, MD;  Location: [facility] ORS;  Service: General;  Laterality: N/A;   IR IMAGING GUIDED PORT INSERTION  04/08/2024   LEFT HEART CATH AND CORONARY ANGIOGRAPHY N/A 10/26/2019   Procedure: LEFT HEART CATH AND CORONARY ANGIOGRAPHY;  Surgeon: Verlin Lonni BIRCH, MD;  Location: [facility] INVASIVE CV LAB;  Service: Cardiovascular;  Laterality: N/A;   LEFT HEART CATHETERIZATION WITH CORONARY ANGIOGRAM N/A 12/25/2012   Procedure: LEFT HEART CATHETERIZATION WITH CORONARY ANGIOGRAM;  Surgeon: Ozell Fell, MD;  Location: [facility] CATH LAB;  Service: Cardiovascular;  Laterality: N/A;   RADIOLOGY WITH ANESTHESIA N/A 04/21/2024   Procedure: MRI WITH ANESTHESIA;  Surgeon: Radiologist, Medication, MD;  Location: [facility] OR;  Service: Radiology;  Laterality: N/A;  BRAIN W/ W/O   ROBOTIC ASSISTED TOTAL HYSTERECTOMY WITH BILATERAL SALPINGO OOPHERECTOMY N/A 12/06/2021   Procedure: XI ROBOTIC ASSISTED TOTAL HYSTERECTOMY WITH BILATERAL SALPINGO OOPHORECTOMY;  Surgeon: [name] SAUNDERS, MD;  Location: [facility] ORS;  Service: Gynecology;  Laterality: N/A;   UMBILICAL HERNIA REPAIR  05/04/1989   doesn't think they used mesh     Medications:   Current  Facility-Administered Medications:    acetaminophen  (TYLENOL ) tablet 650 mg, 650 mg, Oral, Q6H PRN, 650 mg at 06/20/24 1442 **OR** acetaminophen  (TYLENOL ) suppository 650 mg, 650 mg, Rectal, Q6H PRN, Fausto Sor A, DO   atorvastatin  (LIPITOR ) tablet 80 mg, 80 mg, Oral, QPM, Fausto Sor A, DO, 80 mg at 06/20/24 1811   bisacodyl (DULCOLAX) EC tablet 5 mg, 5 mg, Oral, Daily PRN, Fausto Sor A, DO   cefTRIAXone  (ROCEPHIN ) 1 g in sodium chloride  0.9 % 100 mL IVPB, 1 g, Intravenous, Q24H, Griffith, Kelly A, DO, Last Rate: 200 mL/hr at 06/20/24 1634, 1 g at 06/20/24 1634   Chlorhexidine  Gluconate Cloth 2 % PADS 6 each, 6 each, Topical, Daily, Fausto Sor LABOR, DO, 6 each at 06/21/24 9041   enoxaparin  (LOVENOX ) injection 80 mg, 1 mg/kg, Subcutaneous, Q12H, Dodson, Andrew J, RPH   feeding supplement (GLUCERNA SHAKE) (GLUCERNA SHAKE) liquid 237 mL, 237 mL, Oral, TID BM, Elgergawy, Dawood S, MD, 237 mL at  06/21/24 0958   FLUoxetine  (PROZAC ) capsule 40 mg, 40 mg, Oral, Daily, Fausto Sor A, DO, 40 mg at 06/21/24 9040   guaiFENesin  (MUCINEX ) 12 hr tablet 600 mg, 600 mg, Oral, BID PRN, Fausto Sor LABOR, DO   hydrOXYzine (ATARAX) tablet 10 mg, 10 mg, Oral, TID PRN, Shona Laurence N, DO, 10 mg at 06/21/24 0533   insulin  aspart (novoLOG ) injection 0-5 Units, 0-5 Units, Subcutaneous, QHS, Fausto Sor LABOR, DO, 4 Units at 06/20/24 2127   insulin  aspart (novoLOG ) injection 0-9 Units, 0-9 Units, Subcutaneous, TID WC, Fausto Sor A, DO, 5 Units at 06/21/24 9171   insulin  aspart (novoLOG ) injection 4 Units, 4 Units, Subcutaneous, TID WC, Elgergawy, Dawood S, MD   insulin  glargine-yfgn (SEMGLEE ) injection 18 Units, 18 Units, Subcutaneous, Daily, Elgergawy, Dawood S, MD   lidocaine  (LIDODERM ) 5 % 1 patch, 1 patch, Transdermal, Q24H, Fausto Sor A, DO, 1 patch at 06/20/24 2114   mirtazapine (REMERON SOL-TAB) disintegrating tablet 15 mg, 15 mg, Oral, QHS, Elgergawy, Dawood S, MD, 15 mg at 06/20/24  2115   morphine  (PF) 2 MG/ML injection 2 mg, 2 mg, Intravenous, Q4H PRN, Fausto Sor A, DO, 2 mg at 06/20/24 0739   ondansetron  (ZOFRAN ) tablet 4 mg, 4 mg, Oral, Q6H PRN **OR** ondansetron  (ZOFRAN ) injection 4 mg, 4 mg, Intravenous, Q6H PRN, Fausto Sor A, DO, 4 mg at 06/19/24 1931   oxyCODONE  (Oxy IR/ROXICODONE ) immediate release tablet 10 mg, 10 mg, Oral, Q6H PRN, Fausto Sor A, DO, 10 mg at 06/21/24 0505   polyethylene glycol (MIRALAX  / GLYCOLAX ) packet 17 g, 17 g, Oral, Daily PRN, Fausto Sor A, DO   sodium chloride  flush (NS) 0.9 % injection 10-40 mL, 10-40 mL, Intracatheter, Q12H, Fausto Sor A, DO, 10 mL at 06/21/24 1000   sodium chloride  flush (NS) 0.9 % injection 10-40 mL, 10-40 mL, Intracatheter, PRN, Fausto Sor A, DO  Allergies: Allergies  Allergen Reactions   Hydrocodone Shortness Of Breath, Dermatitis and Other (See Comments)    I forget to breathe.   Clopidogrel Rash and Dermatitis    Kruti Horacek A Naviyah Schaffert, MD

## 2024-06-22 ENCOUNTER — Other Ambulatory Visit (HOSPITAL_COMMUNITY): Payer: Self-pay

## 2024-06-22 ENCOUNTER — Inpatient Hospital Stay

## 2024-06-22 ENCOUNTER — Inpatient Hospital Stay: Admitting: Hematology and Oncology

## 2024-06-22 ENCOUNTER — Inpatient Hospital Stay (HOSPITAL_COMMUNITY)

## 2024-06-22 ENCOUNTER — Encounter: Payer: Self-pay | Admitting: Hematology and Oncology

## 2024-06-22 ENCOUNTER — Other Ambulatory Visit: Payer: Self-pay | Admitting: Hematology and Oncology

## 2024-06-22 DIAGNOSIS — Z86711 Personal history of pulmonary embolism: Secondary | ICD-10-CM | POA: Diagnosis not present

## 2024-06-22 DIAGNOSIS — G9341 Metabolic encephalopathy: Secondary | ICD-10-CM | POA: Diagnosis not present

## 2024-06-22 LAB — BASIC METABOLIC PANEL WITH GFR
Anion gap: 10 (ref 5–15)
BUN: 12 mg/dL (ref 8–23)
CO2: 26 mmol/L (ref 22–32)
Calcium: 8.7 mg/dL — ABNORMAL LOW (ref 8.9–10.3)
Chloride: 101 mmol/L (ref 98–111)
Creatinine, Ser: 0.58 mg/dL (ref 0.44–1.00)
GFR, Estimated: >60 mL/min (ref >60)
Glucose, Bld: 262 mg/dL — ABNORMAL HIGH (ref 70–99)
Potassium: 4 mmol/L (ref 3.5–5.1)
Sodium: 137 mmol/L (ref 135–145)

## 2024-06-22 LAB — CBC
HCT: 27.4 % — ABNORMAL LOW (ref 36.0–46.0)
Hemoglobin: 8.7 g/dL — ABNORMAL LOW (ref 12.0–15.0)
MCH: 27.4 pg (ref 26.0–34.0)
MCHC: 31.8 g/dL (ref 30.0–36.0)
MCV: 86.2 fL (ref 80.0–100.0)
Platelets: 299 K/uL (ref 150–400)
RBC: 3.18 MIL/uL — ABNORMAL LOW (ref 3.87–5.11)
RDW: 15.8 % — ABNORMAL HIGH (ref 11.5–15.5)
WBC: 7.5 K/uL (ref 4.0–10.5)
nRBC: 0 % (ref 0.0–0.2)

## 2024-06-22 LAB — URINE CULTURE: Culture: >=100000 — AB

## 2024-06-22 LAB — PHOSPHORUS: Phosphorus: 3.5 mg/dL (ref 2.5–4.6)

## 2024-06-22 LAB — GLUCOSE, CAPILLARY
Glucose-Capillary: 287 mg/dL — ABNORMAL HIGH (ref 70–99)
Glucose-Capillary: 358 mg/dL — ABNORMAL HIGH (ref 70–99)
Glucose-Capillary: 276 mg/dL — ABNORMAL HIGH (ref 70–99)

## 2024-06-22 LAB — MAGNESIUM: Magnesium: 1.6 mg/dL — ABNORMAL LOW (ref 1.7–2.4)

## 2024-06-22 MED ORDER — HEPARIN SOD (PORK) LOCK FLUSH 100 UNIT/ML IV SOLN
500.0000 [IU] | INTRAVENOUS | Status: AC | PRN
  Administered 2024-06-22: 500 [IU]

## 2024-06-22 MED ORDER — CEPHALEXIN 500 MG PO CAPS
500.0000 mg | ORAL_CAPSULE | Freq: Four times a day (QID) | ORAL | 0 refills | Status: AC
  Filled 2024-06-22: qty 12, 3d supply, fill #0

## 2024-06-22 MED ORDER — HYDROXYZINE HCL 10 MG PO TABS
10.0000 mg | ORAL_TABLET | Freq: Three times a day (TID) | ORAL | 0 refills | Status: AC | PRN
  Filled 2024-06-22: qty 60, 20d supply, fill #0

## 2024-06-22 MED ORDER — INSULIN DEGLUDEC 100 UNIT/ML ~~LOC~~ SOPN: 20.0000 [IU] | PEN_INJECTOR | Freq: Every day | SUBCUTANEOUS | Status: DC

## 2024-06-22 MED ORDER — HALOPERIDOL 1 MG PO TABS
2.0000 mg | ORAL_TABLET | Freq: Two times a day (BID) | ORAL | 0 refills | Status: AC
  Filled 2024-06-22: qty 120, 30d supply, fill #0

## 2024-06-22 MED ORDER — PREGABALIN 25 MG PO CAPS
50.0000 mg | ORAL_CAPSULE | Freq: Three times a day (TID) | ORAL | Status: DC
  Administered 2024-06-22: 50 mg via ORAL
  Filled 2024-06-22: qty 2

## 2024-06-22 MED ORDER — ENOXAPARIN SODIUM 80 MG/0.8ML IJ SOSY
1.0000 mg/kg | PREFILLED_SYRINGE | Freq: Two times a day (BID) | INTRAMUSCULAR | 0 refills | Status: AC
  Filled 2024-06-22: qty 48, 30d supply, fill #0

## 2024-06-22 MED ORDER — MIRTAZAPINE 15 MG PO TABS
15.0000 mg | ORAL_TABLET | Freq: Every day | ORAL | 0 refills | Status: AC
  Filled 2024-06-22: qty 30, 30d supply, fill #0

## 2024-06-22 NOTE — Plan of Care (Signed)
  Problem: Coping: Goal: Ability to adjust to condition or change in health will improve Outcome: Progressing   Problem: Fluid Volume: Goal: Ability to maintain a balanced intake and output will improve Outcome: Progressing   Problem: Skin Integrity: Goal: Risk for impaired skin integrity will decrease Outcome: Progressing   Problem: Education: Goal: Knowledge of General Education information will improve Description: Including pain rating scale, medication(s)/side effects and non-pharmacologic comfort measures Outcome: Progressing   Problem: Clinical Measurements: Goal: Will remain free from infection Outcome: Progressing Goal: Diagnostic test results will improve Outcome: Progressing   Problem: Activity: Goal: Risk for activity intolerance will decrease Outcome: Progressing   Problem: Coping: Goal: Level of anxiety will decrease Outcome: Progressing

## 2024-06-22 NOTE — Evaluation (Signed)
 Occupational Therapy Evaluation Patient Details Name: Brenda [name] MRN: [id number] DOB: 1961-06-06 Today's Date: 06/22/2024   History of Present Illness   63 y.o. female presents to [facility] hospital on 05/29/24 for AMS. EEG r/o seizures negative for acute processes. 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 PMH includes HTN, lipidemia, DM, CAD, CHF, anxiety, depression, OSA, chronic respiratory failure, metastatic endometrial carcinoma.     Clinical Impressions Patient admitted for the diagnosis above.  PTA she lives at home with assist as needed from family.  Patient is motivated to regain independence and would benefit from [facility] OT services to maximize her functional status.  OT will continue efforts in the acute setting to address deficits.       If plan is discharge home, recommend the following:   A little help with walking and/or transfers;A lot of help with bathing/dressing/bathroom;Assistance with cooking/housework;Direct supervision/assist for medications management;Direct supervision/assist for financial management;Assist for transportation;Supervision due to cognitive status     Functional Status Assessment   Patient has had a recent decline in their functional status and demonstrates the ability to make significant improvements in function in a reasonable and predictable amount of time.     Equipment Recommendations         Recommendations for Other Services         Precautions/Restrictions   Precautions Precautions: Fall Recall of Precautions/Restrictions: Intact Restrictions Weight Bearing Restrictions Per Provider Order: No     Mobility Bed Mobility Overal bed mobility: Needs Assistance Bed Mobility: Supine to Sit, Sit to Supine     Supine to sit: Supervision Sit to supine: Supervision        Transfers Overall transfer level: Needs assistance Equipment used: Rolling walker (2 wheels) Transfers: Sit to/from Stand, Bed  to chair/wheelchair/BSC Sit to Stand: Contact guard assist     Step pivot transfers: Min assist            Balance Overall balance assessment: Needs assistance Sitting-balance support: Feet supported Sitting balance-Leahy Scale: Good     Standing balance support: Reliant on assistive device for balance Standing balance-Leahy Scale: Poor                             ADL either performed or assessed with clinical judgement   ADL       Grooming: Cueing for sequencing;Standing;Wash/dry hands;Oral care;Minimal assistance               Lower Body Dressing: Moderate assistance;Cueing for sequencing;Sit to/from stand   Toilet Transfer: Minimal assistance;Ambulation;Regular Toilet;Rolling walker (2 wheels)                   Vision Baseline Vision/History: 1 Wears glasses Ability to See in Adequate Light: 2 Moderately impaired Patient Visual Report: Blurring of vision;Peripheral vision impairment Visual Fields: Left visual field deficit     Perception Perception: Impaired Preception Impairment Details: Inattention/Neglect     Praxis Praxis: WFL       Pertinent Vitals/Pain Pain Assessment Pain Assessment: No/denies pain Pain Intervention(s): Monitored during session     Extremity/Trunk Assessment Upper Extremity Assessment Upper Extremity Assessment: Overall WFL for tasks assessed;Right hand dominant       Cervical / Trunk Assessment Cervical / Trunk Assessment: Normal   Communication Communication Communication: No apparent difficulties   Cognition Arousal: Alert Behavior During Therapy: WFL for tasks assessed/performed Cognition: Cognition impaired   Orientation impairments: Time, Situation Awareness: Intellectual awareness impaired Memory impairment (  select all impairments): Short-term memory, Working memory Attention impairment (select first level of impairment): Sustained attention Executive functioning impairment (select all  impairments): Problem solving, Reasoning                   Following commands: Impaired Following commands impaired: Follows multi-step commands inconsistently     Cueing  General Comments   Cueing Techniques: Verbal cues;Tactile cues      Exercises     Shoulder Instructions      Home Living Family/patient expects to be discharged to:: Private residence Living Arrangements: Spouse/significant other;Children Available Help at Discharge: Family Type of Home: House Home Access: Ramped entrance     Home Layout: One level     Bathroom Shower/Tub: Chief Strategy Officer: Standard Bathroom Accessibility: Yes   Home Equipment: Rollator (4 wheels);BSC/3in1;Shower seat;Grab bars - toilet;Grab bars - tub/shower   Additional Comments: did not use AD in the home but was using w/c and rollator in the community; on 2L O2  Lives With: Spouse;Family    Prior Functioning/Environment Prior Level of Function : History of Falls (last six months);Independent/Modified Independent;Driving             Mobility Comments: Rollator in home ADLs Comments: independent with ADLs, family assists wtih iADLs.    OT Problem List: Decreased activity tolerance;Obesity;Decreased strength;Impaired balance (sitting and/or standing);Decreased coordination;Decreased cognition;Decreased safety awareness;Decreased knowledge of use of DME or AE;Decreased knowledge of precautions;Impaired vision/perception   OT Treatment/Interventions: Self-care/ADL training;Therapeutic exercise;DME and/or AE instruction;Therapeutic activities;Patient/family education;Balance training;Cognitive remediation/compensation;Energy conservation;Visual/perceptual remediation/compensation      OT Goals(Current goals can be found in the care plan section)   Acute Rehab OT Goals Patient Stated Goal: Return home OT Goal Formulation: With patient Time For Goal Achievement: 07/06/24 Potential to Achieve Goals:  Good ADL Goals Pt Will Perform Grooming: with supervision;standing Pt Will Perform Lower Body Dressing: with supervision;sit to/from stand Pt Will Transfer to Toilet: with modified independence;ambulating;regular height toilet   OT Frequency:  Min 2X/week    Co-evaluation              AM-PAC OT 6 Clicks Daily Activity     Outcome Measure Help from another person eating meals?: A Little Help from another person taking care of personal grooming?: A Little Help from another person toileting, which includes using toliet, bedpan, or urinal?: A Lot Help from another person bathing (including washing, rinsing, drying)?: A Lot Help from another person to put on and taking off regular upper body clothing?: A Little Help from another person to put on and taking off regular lower body clothing?: A Lot 6 Click Score: 15   End of Session Equipment Utilized During Treatment: Gait belt Nurse Communication: Mobility status  Activity Tolerance: Patient tolerated treatment well Patient left: in bed;with call bell/phone within reach;with bed alarm set;with family/visitor present  OT Visit Diagnosis: Other abnormalities of gait and mobility (R26.89);Other symptoms and signs involving cognitive function;Cognitive communication deficit (R41.841)                Time: 8868-8846 OT Time Calculation (min): 22 min Charges:  OT General Charges $OT Visit: 1 Visit OT Evaluation $OT Eval Moderate Complexity: 1 Mod  06/22/2024  RP, OTR/L  Acute Rehabilitation Services  Office:  [phone]   Charlie JONETTA Halsted 06/22/2024, 12:27 PM

## 2024-06-22 NOTE — TOC Progression Note (Addendum)
 Transition of Care [facility]) - Progression Note    Patient Details  Name: Brenda [name] MRN: [id number] Date of Birth: 1961-02-24  [facility] CM/SW Contact  Nola Devere Hands, RN Phone Number: 06/22/2024, 11:03 AM  Clinical Narrative:     Case Manager contacted the [facility] Neuro on [employer] [employer], confirmed that patient has followup appointment scheduled for Tuesday, 07/07/24 @ 3:45pm. Left message with the Outpt Neuro to confirm appt for Physical therapy, awaiting return call.    12:30 Patient will have HHPT/OT as arranged prior to this admission. Case Manager confirmed this with Brandi, Liaison for [facility].                  Expected Discharge Plan and Services         Expected Discharge Date: 06/22/24                                     Social Drivers of Health (SDOH) Interventions SDOH Screenings   Food Insecurity: No Food Insecurity (06/19/2024)  Recent Concern: Food Insecurity - Food Insecurity Present (04/20/2024)  Housing: Low Risk  (06/19/2024)  Transportation Needs: No Transportation Needs (06/19/2024)  Utilities: Not At Risk (06/19/2024)  Depression (PHQ2-9): Medium Risk (05/07/2024)  Financial Resource Strain: High Risk (02/10/2021)  Social Connections: Unknown (05/04/2024)  Tobacco Use: Medium Risk (06/20/2024)    Readmission Risk Interventions    05/04/2024   10:00 AM  Readmission Risk Prevention Plan  Transportation Screening Complete  PCP or Specialist Appt within 3-5 Days Complete  HRI or Home Care Consult Complete  Social Work Consult for Recovery Care Planning/Counseling Complete  Palliative Care Screening Not Applicable  Medication Review [employer]) Complete

## 2024-06-22 NOTE — Progress Notes (Signed)
   06/22/24 1155  Spiritual Encounters  Type of Visit Initial  Care provided to: Pt and family  Reason for visit Routine spiritual support  OnCall Visit No  Spiritual Framework  Presenting Themes Impactful experiences and emotions  Community/Connection Family  Patient Stress Factors Health changes;Major life changes  Family Stress Factors Major life changes;Health changes  Interventions  Spiritual Care Interventions Made Compassionate presence;Established relationship of care and support  Intervention Outcomes  Outcomes Awareness of support;Awareness of health   Chaplain introduced self to the Pt. [name] daughter was present at the bedside. Pt informed chaplain that their pastor was on the way to visit.   Chaplain thanked them for the information and shared that chaplain services are available to provide emotional and spiritual support as  need. Pt and daughter expressed appreciation for the visit and stated they would notify the medical team if additional support is needed.

## 2024-06-22 NOTE — Progress Notes (Signed)
 Reviewed AVS, patient expressed understanding of medications, MD follow up reviewed.   Removed IV, Site clean, dry and intact.  See LDA for information on wounds at discharge. CCMD contacted and informed patients is being discharged.  Patient states all belongings brought to the hospital at time of admission are accounted for and packed to take home.  Picked up medications from [facility] pharmacy. Currently waiting for port access to be removed.   [employer] contacted to transport patient to entrance A, where family member was waiting in vehicle to transport home.

## 2024-06-22 NOTE — Consult Note (Addendum)
 [facility] Psychiatric Consult Initial  Patient Name: .Brenda [name]  MRN: [id number]  DOB: 02-19-1961  Consult Order details:  Orders (From admission, onward)     Start     Ordered   06/21/24 1105  IP CONSULT TO PSYCHIATRY       Comments: Patient with recent notes of endometrial cancer, she had actually stroke, but she presents with creased confusion, it does appear she is having worsening depression, and selective visual hallucination, indicative of increased stress, likely will need adding more medication to her depression meds.  Ordering Provider: Sherlon Brayton RAMAN, MD  Provider:  (Not yet assigned)  Question Answer Comment  Location [facility]   Reason for Consult? Worsening depression, hallucination.      06/21/24 1105             Mode of Visit: In person    Psychiatry Consult Evaluation  Service Date: June 22, 2024 LOS:  LOS: 3 days  Chief Complaint I have been seeing things lately and getting confused  Primary Psychiatric Diagnoses  Delusion likely due to UTI/metastatic cancer/medications 2.  Major depressive disorder likely secondary to multiple medical conditions  Assessment  Brenda [name] is a 63 y.o. female admitted: Medically on 06/19/2024  9:40 AM for worsening confusion. 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 Psychiatry consulted for Patient with recent notes of endometrial cancer, she had actually stroke, but she presents with creased confusion, it does appear she is having worsening depression, and selective visual hallucination, indicative of increased stress, likely will need adding more medication to her depression meds.  Her current presentation  of worsening confusion, visual hallucination and anxiety in the context of recent UTI and underlying metastatic cancer is most consistent with Delirium. She does not meet criteria for psychiatric inpatient admission based on absent suicidal or homicidal ideation, intent or plan, ability to participate in suicide risk assessment and support from her husband. Current outpatient psychotropic medications include Prozac  40 mg daily and Wellbutrin  XL 150 mg every morning and historically she has had a good response to these medications. She was compliant with medications prior to admission as evidenced by reports from patient. On initial examination, patient is awake, alert and oriented to time, place, person and situation. She is calm and cooperative with no psychomotor abnormalities. Her mood is good with appropriate affect. Patient is logical, coherent with goal directed thought process. She endorsed visual hallucination but denies suicidal or homicidal ideation, intent or plan. Patient has full insight into her mental health. She reports improvement in hallucinations with haloperidol. Recommend continuing haloperidol for now to aid with ongoing hallucinations and follow up with outpatient psychiatry for further medication adjustments. Please see plan below for detailed recommendations.   Diagnoses:  Active Hospital problems: Principal Problem:   Acute metabolic encephalopathy    Plan   ## Psychiatric Medication Recommendations:  --Continue Prozac  40 mg daily for depression/anxiety. Note-Patient does not need additional Wellbutrin  at this time.  --Continue Hydroxyzine 10 mg three times daily for anxiety -Continue Remeron 15 mg at bedtime for sleep --Continue Haldol 2 mg twice daily for psychosis. -Avoid Benzodiazepine to prevent worsening of confusion/disinhibition -Correct electrolyte abnormality (Sodium-133, magnesium -1.3, calcium -8.8) and monitor glucose level appropriately -Continue other medical  treatment as the primary team.  -psychiatry will follow up.    ## Medical Decision Making Capacity: Not specifically addressed in this encounter  ## Further Work-up:  --  TSH, B12, folate -- most recent EKG on 06/19/24 had QtC of 467 -- Pertinent labwork reviewed earlier this admission includes: Sodium-133, magnesium -1.3, calcium -8.8, Hb-9.1, cloudy urine, moderate leukocytes, and many bacteria.    ## Disposition:-- There are no psychiatric contraindications to discharge at this time  ## Behavioral / Environmental: -Delirium Precautions: Delirium Interventions for Nursing and Staff: - RN to open blinds every AM. - To Bedside: Glasses, hearing aide, and pt's own shoes. Make available to patients. when possible and encourage use. - Encourage po fluids when appropriate, keep fluids within reach. - OOB to chair with meals. - Passive ROM exercises to all extremities with AM & PM care. - RN to assess orientation to person, time and place QAM and PRN. - Recommend extended visitation hours with familiar family/friends as feasible. - Staff to minimize disturbances at night. Turn off television when pt asleep or when not in use.    ## Safety and Observation Level:  - Based on my clinical evaluation, I estimate the patient to be at low risk of self harm in the current setting. - At this time, we recommend  routine. This decision is based on my review of the chart including patient's history and current presentation, interview of the patient, mental status examination, and consideration of suicide risk including evaluating suicidal ideation, plan, intent, suicidal or self-harm behaviors, risk factors, and protective factors. This judgment is based on our ability to directly address suicide risk, implement suicide prevention strategies, and develop a safety plan while the patient is in the clinical setting. Please contact our team if there is a concern that risk level has changed.  CSSR Risk Category:C-SSRS  RISK CATEGORY: No Risk  Suicide Risk Assessment: Patient has following modifiable risk factors for suicide: triggering events and pain, medical illness (ie new dx of cancer), which we are addressing by recommending medical treatment and by prescribing medications. Patient has following non-modifiable or demographic risk factors for suicide:  Patient has the following protective factors against suicide: Supportive family, Cultural, spiritual, or religious beliefs that discourage suicide, and Minor children in the home  Thank you for this consult request. Recommendations have been communicated to the primary team.  We will sign off at this time.   Prentice Espy, MD       History of Present Illness  Relevant Aspects of [facility] Course:   Patient Report:  Patient seen face to face in her hospital room. She is awake, alert and oriented to time, place, person and situation. Patient does not appear confused and understands she's in [facility] hospital and was admitted due to recent confusion. Patient reports she was recently diagnosed with stroke, heart attack and metastatic endometrial cancer for which she has been receiving treatment. Patient also reports visual hallucination, seeing things others don't see and she has been getting more anxious lately, but denies auditory hallucinations, paranoia, idea of reference, thought insertion or thought broadcasting. She reports occasional thought of suicide due to feeling overwhelmed as a result of her multiple medical conditions but denies current suicidal thought, intent or plan. She denies any previous history of suicide or self injurious behavior and identifies the love of her adopted son and husband as the main reason to be alive. Patient reports getting adequate support from her husband even though they argue about things occasionally.  Patient denies depressed mood, lack of motivation, appetite problem, weight loss, hopelessness, or helplessness. She  denies any previous psychiatric admission but currently takes Prozac  40 mg daily and hydroxyzine 10 mg three times daily as needed for sleep and anxiety. Patient also states she was taking Wellbutrin  XL 150 mg daily but believes she has not been taking it on this admission. She reports drinking alcohol socially but denies use of any other reactional substances including marijuana, cocaine, crack, PCP, K2, or crystal meth.   06/22/24 Patient seen and assessed in hospital room. She's axox4 and feels ready for discharge from hospital. Denies SI/HI/AVH She reports haloperidol was helpful for her AH and denies any acute side effects from it at this time. Discussed having her follow up outpatient with psychiatry and therapy for which she was amenable.     Review of Systems  Psychiatric/Behavioral:  Positive for hallucinations. Negative for depression, substance abuse and suicidal ideas. The patient is nervous/anxious. The patient does not have insomnia.      Psychiatric and Social History  Psychiatric History:  Information collected from patient.  Prev Dx/Sx: major depressive disorder.  Current Psych Provider: none Home Meds (current): Prozac  40 mg daily and Wellbutrin  XL 150 mg daily Previous Med Trials: unknown  Therapy: yes, patient currently sees a therapist biweekly.   Prior Psych Hospitalization: patient denies   Prior Self Harm: patient denies  Prior Violence: patient denies   Family Psych History: denies  Family Hx suicide: denies   Social History:  Educational Hx: some college Occupational Yk:lwzfeonbzi, currently on disability benefit.  Legal Hx: denies  Living Situation: Lives with her husband and her adopted son Spiritual Hx: unsure Access to weapons/lethal means: patient denies access to firearm.    Substance History Alcohol: drinks socially  Type of alcohol wine Last Drink few days ago Number of drinks per day-once or twice per week History of alcohol withdrawal  seizures denies  History of DT's denies  Tobacco: former smoker  Illicit drugs:denies  Prescription drug abuse:denies  Rehab yk:izwpzd   Exam Findings   Vital Signs:  Temp:  [97.4 F (36.3 C)-98.5 F (36.9 C)] 97.5 F (36.4 C) (10/20 1155) Pulse Rate:  [75-86] 77 (10/20 1155) Resp:  [10-26] 11 (10/20 1155) BP: (126-156)/(48-66) 130/48 (10/20 1155) SpO2:  [93 %-100 %] 100 % (10/20 1155) Blood pressure (!) 130/48, pulse 77, temperature (!) 97.5 F (36.4 C), temperature source Oral, resp. rate 11, height 5' 2 (1.575 m), weight 81 kg, last menstrual period 10/30/2014, SpO2 100%. Body mass index is 32.66 kg/m.  Physical Exam  Mental Status Exam: General Appearance: Casual  Orientation:  Full (Time, Place, and Person)  Memory:  Immediate;   Good Recent;   Good Remote;   Good  Concentration:  Concentration: Good and Attention Span: Good  Recall:  Good  Attention  Good  Eye Contact:  Good  Speech:  Clear and Coherent  Language:  Good  Volume:  Normal  Mood: I feel good  Affect:  Appropriate and Congruent  Thought Process:  Coherent, Goal Directed, and Linear  Thought Content:  Hallucinations: Visual  Suicidal Thoughts:  No  Homicidal Thoughts:  No  Judgement:  Good  Insight:  Good  Psychomotor Activity:  Normal  Akathisia:  No  Fund of Knowledge:  Good      Assets:  Communication Skills Social Support  Cognition:  WNL  ADL's:  Impaired  AIMS (if indicated):        Other History  These have been pulled in through the EMR, reviewed, and updated if appropriate.  Family History:  The patient's family history includes COPD in her father; Cancer in her mother; Coronary artery disease in her mother and sister; Diabetes in her sister; Heart attack in her father; Hypertension in her mother; Other in her half-sister; Stroke in her sister; Thyroid  cancer in her daughter.  Medical History: Past Medical History:  Diagnosis Date   Anemia    Arthritis    back, hands,  hips (02/22/2015)   CAD (coronary artery disease)    a. complex LAD/diagonal bifurcation PCI in 2010. b. STEMI 10/2019 s/p PTCA/DES x1 to mLAD overlapping the old stent, residual disease treated medicaly.   Cancer [facility])    uterine   CHF (congestive heart failure) (HCC)    Depression    Diabetes mellitus (HCC)    started when I was pregnant; not sure if it was type 1 or type 2    History of radiation therapy    Endometrium- HDR 01/22/22-03/07/22- Dr. Lynwood Nasuti   Hypercholesterolemia    Hypertension    Morbid obesity (HCC)    Myocardial infarction (HCC)    mild x 3   Neuromuscular disorder (HCC)    neuropathy feet   Sleep apnea    cpap/    Surgical History: Past Surgical History:  Procedure Laterality Date   CARDIAC CATHETERIZATION  12/02/2012   CHOLECYSTECTOMY OPEN  09/04/1987   CORONARY ANGIOPLASTY WITH STENT PLACEMENT  09/03/2009   CORONARY/GRAFT ACUTE MI REVASCULARIZATION N/A 10/26/2019   Procedure: CORONARY/GRAFT ACUTE MI REVASCULARIZATION;  Surgeon: Verlin Lonni BIRCH, MD;  Location: [facility] INVASIVE CV LAB;  Service: Cardiovascular;  Laterality: N/A;   INCISIONAL HERNIA REPAIR N/A 12/09/2021   Procedure: LAPAROSCOPIC REPAIR UMBILICAL HERNIA WITH MESH;  Surgeon: Vernetta Berg, MD;  Location: [facility] ORS;  Service: General;  Laterality: N/A;   IR IMAGING GUIDED PORT INSERTION  04/08/2024   LEFT HEART CATH AND CORONARY ANGIOGRAPHY N/A 10/26/2019   Procedure: LEFT HEART CATH AND CORONARY ANGIOGRAPHY;  Surgeon: Verlin Lonni BIRCH, MD;  Location: [facility] INVASIVE CV LAB;  Service: Cardiovascular;  Laterality: N/A;   LEFT HEART CATHETERIZATION WITH CORONARY ANGIOGRAM N/A 12/25/2012   Procedure: LEFT HEART CATHETERIZATION WITH CORONARY ANGIOGRAM;  Surgeon: Ozell Fell, MD;  Location: [facility] CATH LAB;  Service: Cardiovascular;  Laterality: N/A;   RADIOLOGY WITH ANESTHESIA N/A 04/21/2024   Procedure: MRI WITH ANESTHESIA;  Surgeon: Radiologist, Medication, MD;  Location: [facility] OR;  Service:  Radiology;  Laterality: N/A;  BRAIN W/ W/O   ROBOTIC ASSISTED TOTAL HYSTERECTOMY WITH BILATERAL SALPINGO OOPHERECTOMY N/A 12/06/2021   Procedure: XI ROBOTIC ASSISTED TOTAL HYSTERECTOMY WITH BILATERAL SALPINGO OOPHORECTOMY;  Surgeon: [name] SAUNDERS, MD;  Location: [facility] ORS;  Service: Gynecology;  Laterality: N/A;   UMBILICAL HERNIA REPAIR  05/04/1989   doesn't think they used mesh     Medications:   Current Facility-Administered Medications:    acetaminophen  (TYLENOL ) tablet 650 mg, 650 mg, Oral, Q6H PRN, 650 mg at 06/20/24 1442 **OR** acetaminophen  (TYLENOL ) suppository 650 mg, 650 mg, Rectal, Q6H PRN, Fausto Sor A, DO   atorvastatin  (LIPITOR ) tablet 80 mg, 80 mg, Oral, QPM, Fausto Sor A, DO, 80 mg at 06/21/24 1746   bisacodyl (DULCOLAX) EC tablet 5 mg, 5 mg, Oral, Daily PRN, Fausto Sor A, DO   cefTRIAXone  (ROCEPHIN ) 1 g in sodium chloride  0.9 % 100 mL IVPB, 1 g, Intravenous, Q24H, Elgergawy, Brayton RAMAN, MD, Last Rate: 200 mL/hr at 06/21/24 1753, 1 g at 06/21/24  1753   Chlorhexidine  Gluconate Cloth 2 % PADS 6 each, 6 each, Topical, Daily, Fausto Burnard LABOR, DO, 6 each at 06/22/24 1040   enoxaparin  (LOVENOX ) injection 80 mg, 1 mg/kg, Subcutaneous, Q12H, Dodson, Angeni Chaudhuri J, RPH, 80 mg at 06/22/24 1305   feeding supplement (GLUCERNA SHAKE) (GLUCERNA SHAKE) liquid 237 mL, 237 mL, Oral, TID BM, Elgergawy, Dawood S, MD, 237 mL at 06/22/24 1041   FLUoxetine  (PROZAC ) capsule 40 mg, 40 mg, Oral, Daily, Fausto Burnard A, DO, 40 mg at 06/22/24 1039   guaiFENesin  (MUCINEX ) 12 hr tablet 600 mg, 600 mg, Oral, BID PRN, Fausto Burnard A, DO   haloperidol (HALDOL) tablet 2 mg, 2 mg, Oral, BID, Akintayo, Musa A, MD, 2 mg at 06/22/24 1039   hydrOXYzine (ATARAX) tablet 10 mg, 10 mg, Oral, TID PRN, Shona Laurence N, DO, 10 mg at 06/21/24 0533   insulin  aspart (novoLOG ) injection 0-5 Units, 0-5 Units, Subcutaneous, QHS, Fausto Burnard LABOR, DO, 2 Units at 06/21/24 2207   insulin  aspart (novoLOG ) injection  0-9 Units, 0-9 Units, Subcutaneous, TID WC, Fausto Burnard A, DO, 9 Units at 06/22/24 1300   insulin  aspart (novoLOG ) injection 4 Units, 4 Units, Subcutaneous, TID WC, Elgergawy, Dawood S, MD, 4 Units at 06/22/24 1300   insulin  glargine-yfgn (SEMGLEE ) injection 18 Units, 18 Units, Subcutaneous, Daily, Elgergawy, Brayton RAMAN, MD, 18 Units at 06/22/24 1039   lidocaine  (LIDODERM ) 5 % 1 patch, 1 patch, Transdermal, Q24H, Fausto Burnard A, DO, 1 patch at 06/21/24 2201   mirtazapine (REMERON SOL-TAB) disintegrating tablet 15 mg, 15 mg, Oral, QHS, Elgergawy, Dawood S, MD, 15 mg at 06/21/24 2201   morphine  (PF) 2 MG/ML injection 2 mg, 2 mg, Intravenous, Q4H PRN, Fausto Burnard A, DO, 2 mg at 06/20/24 9260   ondansetron  (ZOFRAN ) tablet 4 mg, 4 mg, Oral, Q6H PRN **OR** ondansetron  (ZOFRAN ) injection 4 mg, 4 mg, Intravenous, Q6H PRN, Fausto Burnard A, DO, 4 mg at 06/19/24 1931   oxyCODONE  (Oxy IR/ROXICODONE ) immediate release tablet 10 mg, 10 mg, Oral, Q6H PRN, Fausto Burnard A, DO, 10 mg at 06/22/24 9162   polyethylene glycol (MIRALAX  / GLYCOLAX ) packet 17 g, 17 g, Oral, Daily PRN, Fausto Burnard A, DO   pregabalin  (LYRICA ) capsule 50 mg, 50 mg, Oral, TID, Elgergawy, Dawood S, MD, 50 mg at 06/22/24 1039   sodium chloride  flush (NS) 0.9 % injection 10-40 mL, 10-40 mL, Intracatheter, Q12H, Fausto Burnard A, DO, 10 mL at 06/22/24 1040   sodium chloride  flush (NS) 0.9 % injection 10-40 mL, 10-40 mL, Intracatheter, PRN, Fausto Burnard A, DO  Allergies: Allergies  Allergen Reactions   Hydrocodone Shortness Of Breath, Dermatitis and Other (See Comments)    I forget to breathe.   Clopidogrel Rash and Dermatitis    Prentice Espy, MD

## 2024-06-22 NOTE — Discharge Instructions (Addendum)
 Follow with Primary MD [facility] in 7 days   Get CBC, CMP,  checked  by Primary MD next visit.    Activity: As tolerated with Full fall precautions use walker/cane & assistance as needed   Disposition Home    Diet: Heart Healthy /carb modified   On your next visit with your primary care physician please Get Medicines reviewed and adjusted.   Please request your Prim.MD to go over all Hospital Tests and Procedure/Radiological results at the follow up, please get all Hospital records sent to your Prim MD by signing hospital release before you go home.   If you experience worsening of your admission symptoms, develop shortness of breath, life threatening emergency, suicidal or homicidal thoughts you must seek medical attention immediately by calling 911 or calling your MD immediately  if symptoms less severe.  You Must read complete instructions/literature along with all the possible adverse reactions/side effects for all the Medicines you take and that have been prescribed to you. Take any new Medicines after you have completely understood and accpet all the possible adverse reactions/side effects.   Do not drive, operating heavy machinery, perform activities at heights, swimming or participation in water activities or provide baby sitting services if your were admitted for syncope or siezures until you have seen by Primary MD or a Neurologist and advised to do so again.  Do not drive when taking Pain medications.    Do not take more than prescribed Pain, Sleep and Anxiety Medications  Special Instructions: If you have smoked or chewed Tobacco  in the last 2 yrs please stop smoking, stop any regular Alcohol  and or any Recreational drug use.  Wear Seat belts while driving.   Please note  You were cared for by a hospitalist during your hospital stay. If you have any questions about your discharge medications or the care you received while you were in the  hospital after you are discharged, you can call the unit and asked to speak with the hospitalist on call if the hospitalist that took care of you is not available. Once you are discharged, your primary care physician will handle any further medical issues. Please note that NO REFILLS for any discharge medications will be authorized once you are discharged, as it is imperative that you return to your primary care physician (or establish a relationship with a primary care physician if you do not have one) for your aftercare needs so that they can reassess your need for medications and monitor your lab values.  Please contact one of the following facilities to start medication management and therapy services:   [facility] at [facility] [address] ([address] [place])  [place], [place]  [address] Phone: [phone]  [facility]  [address] [place], [place] [address] Phone: [phone]  [facility]  [address]  [place], [place] [address]  [facility]  [address]  [place], [place] [address] Phone: [phone]

## 2024-06-22 NOTE — Discharge Summary (Addendum)
 Physician Discharge Summary  [name] FMW:[id number] DOB: September 14, 1960 DOA: 06/19/2024  PCP: [facility]  Admit date: 06/19/2024 Discharge date: 06/22/2024  Admitted From: (Home) Disposition:  (Home)  Recommendations for Outpatient Follow-up:  Follow up with PCP in 1-2 weeks Please obtain BMP/CBC in one week    Diet recommendation: Heart Healthy / Carb Modified     Brief/Interim Summary:  Brenda [name] is a 63 y.o. 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  Please see discussion below   Acute metabolic encephalopathy -Very likely in the setting of UTI. - As well in the setting of recent CVA -She developed visual hallucinations as well likely related - CT head with no acute findings. - Right brain with expected evolution of recent embolic CVA, chronic to subacute, but no acute findings -EEG with no evidence of seizures - As well her altered mentation significant mainly for visual hallucinations, where she is seeing some family members, she reports she acknowledges that she knows they are far but she still sees them, report and missed them, as well yesterday during daytime he reports he could not see her husband, and once asked how is it possible, reports that she feels that he is busy with other things, that is why she cannot see him, and certainly she appears more stressed, more depressed tearful  occasionally, was started on low-dose mirtazapine which she reports helped her with sleep, help her appetite, psychiatry were consulted for which she was started on haloperidol given her visual hallucination, she had significant improvement, where she denies any further visual hallucinations.  Depression Hallucinations -Please see above discussion, as discussed with psychiatry, medication recommendation on discharge including to continue home Prozac , to add as needed Atarax for anxiety, started on low-dose mirtazapine to help with mood, sleep and appetite, as well started on low-dose Haldol.  If still UTI -Reports polyuria, has suprapubic tenderness, positive UA - Culture growing Klebsiella, treated with IV Rocephin  during hospital stay, will discharge on Keflex       Acute PE, RLL  - incidental finding on CT A/P - Has diagnosis of acute DVT during recent hospitalization -Remains on heparin  drip, she is with known hypercoagulable status with malignancy during recent hospitalization started on Eliquis, difficult to tell if this is Eliquis failure or something she had during previous admission, discussed with hematology/oncology, but for now recommendation is to continue with full dose Lovenox  and further workup and recommendation as an outpatient  Splenic infarct  - Splenic infarct was not present on previous CT abdomen pelvis 05/04/2024, but still difficult to know if this is her Eliquis or prior to Eliquis treatment - On heparin  gtt. on admission, transition to Lovenox  treatment dose.   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 - Her residual stroke symptoms seem to be having some recrudence in setting of acute illness.  --Continue Lipitor  --Monitor neuro status -MRI with no acute findings  Type 2 diabetes, insulin -dependent, with hyperglycemia Last A1c 7.2% -- Continue with  home regimen   Metastatic endometrial cancer  -  on active chemo. Follows with Dr. Lonn --Continue home pain regimen   Neuropathic pain -Continue with Lyrica     Hx of CAD  - sp PCI in 2021.  Stable, no chest pain   Chronic HFpEF --Effient  was dc'd for Eliquis in setting of her stroke in October --Monitor volume status --EKG and troponin if chest pain --Continue Lipitor  --Heparin  as above for now   Chronic respiratory failure with hypoxia - stable on baseline 2 L/min O2 --Continue O2   OSA -- nightly CPAP ordered         Discharge Diagnoses:  Principal Problem:   Acute metabolic encephalopathy    Discharge Instructions  Discharge Instructions     Diet - low sodium heart healthy   Complete by: As directed    Discharge instructions   Complete by: As directed    Increase activity slowly   Complete by: As directed       Allergies as of 06/22/2024       Reactions   Hydrocodone Shortness Of Breath, Dermatitis, Other (See Comments)   I forget to breathe.   Clopidogrel Rash, Dermatitis        Medication List     STOP taking these medications    buPROPion  150 MG 24 hr tablet Commonly known as: WELLBUTRIN  XL   Eliquis 5 MG Tabs tablet Generic drug: apixaban   Fiasp  FlexTouch 100 UNIT/ML FlexTouch Pen Generic drug: insulin  aspart   losartan  25 MG tablet Commonly known as: COZAAR    ondansetron  8 MG tablet Commonly known as: ZOFRAN        TAKE these medications    acetaminophen  500 MG tablet Commonly known as: TYLENOL  Take 1-2 tablets (500-1,000 mg total) by mouth every 8 (eight) hours as needed for mild pain (pain score 1-3) or moderate pain (pain score 4-6).   atorvastatin  80 MG tablet Commonly known as: LIPITOR  Take 1 tablet (80 mg total) by mouth every evening.   cephALEXin  500 MG capsule Commonly known as: KEFLEX  Take 1 capsule (500 mg total) by mouth 4 (four) times daily for 3 days.   enoxaparin  80 MG/0.8ML injection Commonly  known as: LOVENOX  Inject 0.8 mLs (80 mg total) into the skin every 12 (twelve) hours.   FLUoxetine  40 MG capsule Commonly known as: PROZAC  Take 1 capsule (40 mg total) by mouth daily.   guaiFENesin  600 MG 12 hr tablet Commonly known as: MUCINEX  Take 1 tablet (600 mg total) by mouth 2 (two) times daily as needed for cough or to loosen phlegm.   haloperidol 1 MG tablet Commonly known as: HALDOL Take 2 tablets (2 mg total) by mouth 2 (two) times daily.   hydrOXYzine 10 MG tablet Commonly known as: ATARAX Take 1 tablet (10 mg total) by mouth 3 (three) times daily as needed for anxiety.   insulin  degludec 100 UNIT/ML FlexTouch Pen Commonly known as: TRESIBA Inject 20 Units into the skin daily. What changed:  how much to take when to take this   insulin  regular 100 units/mL injection Commonly known as: NOVOLIN  R Inject 5 Units into the skin 3 (three) times daily before meals.   lactulose  10 GM/15ML solution Commonly known as: CHRONULAC  Take 45 mLs (30 g total) by mouth 2 (two) times daily as needed for mild constipation or moderate constipation.   lidocaine  5 % Commonly known as: LIDODERM  Place 1 patch onto  the skin daily.   metFORMIN 500 MG 24 hr tablet Commonly known as: GLUCOPHAGE-XR Take 2 tablets (1,000 mg total) by mouth 2 (two) times daily with a meal.   mirtazapine 15 MG tablet Commonly known as: REMERON Take 1 tablet (15 mg total) by mouth at bedtime.   Misc. Devices Misc Portable oxygen  concentrator, 2L oxygen .  Diagnoses-chronic respiratory failure with hypoxia   NEURIVA PO Take 1 tablet by mouth daily.   nitroGLYCERIN  0.4 MG SL tablet Commonly known as: NITROSTAT  Dissolve 1 tablet under the tongue every 5 minutes as needed for chest pain. Max of 3 doses, then 911.   nystatin cream Commonly known as: MYCOSTATIN Apply topically 2 (two) times daily. What changed:  when to take this reasons to take this   Oxycodone  HCl 10 MG Tabs Take 1 tablet (10 mg  total) by mouth every 6 (six) hours as needed. What changed: reasons to take this   polyethylene glycol powder 17 GM/SCOOP powder Commonly known as: GLYCOLAX /MIRALAX  Dissolve 1 capful (17g) in 4-8 ounces of liquid and take by mouth daily as needed for mild constipation.   pregabalin  50 MG capsule Commonly known as: LYRICA  Take 1 capsule (50 mg total) by mouth 3 (three) times daily.   senna-docusate 8.6-50 MG tablet Commonly known as: Senokot-S Take 1-2 tablets by mouth 2 (two) times daily between meals as needed for mild constipation or moderate constipation. What changed: when to take this   Vitamin D3 Super Strength 50 MCG (2000 UT) Caps Generic drug: Cholecalciferol  Take 2,000 Units by mouth in the morning.        Follow-up Information     [facility].   Specialty: Home Health Services Why: Someone from [facility] will contact you to arrsnge start date and time for your Physical and Occupational therapy. Contact information: [address] [place] [address] [phone]                Allergies  Allergen Reactions   Hydrocodone Shortness Of Breath, Dermatitis and Other (See Comments)    I forget to breathe.   Clopidogrel Rash and Dermatitis    Consultations: Hematology/oncology Psychiatry   Procedures/Studies: VAS US  LOWER EXTREMITY VENOUS (DVT) Result Date: 06/22/2024  Lower Venous DVT Study Patient Name:  Brenda [name]  Date of Exam:   06/22/2024 Medical Rec #: [id number]            Accession #:    [id number] Date of Birth: [birth year]            Patient Gender: F Patient Age:   24 years Exam Location:  [facility] Procedure:      VAS US  LOWER EXTREMITY VENOUS (DVT) Referring Phys: BURNARD GRIFFITH --------------------------------------------------------------------------------  Indications: Pulmonary embolism. Other Indications: H/O CVA and cancer. Comparison Study: Previous study on 9.30.2025.  Performing Technologist: Edilia Elden Appl  Examination Guidelines: A complete evaluation includes B-mode imaging, spectral Doppler, color Doppler, and power Doppler as needed of all accessible portions of each vessel. Bilateral testing is considered an integral part of a complete examination. Limited examinations for reoccurring indications may be performed as noted. The reflux portion of the exam is performed with the patient in reverse Trendelenburg.  +---------+---------------+---------+-----------+----------+--------------+ RIGHT    CompressibilityPhasicitySpontaneityPropertiesThrombus Aging +---------+---------------+---------+-----------+----------+--------------+ CFV      Full           Yes      Yes                                 +---------+---------------+---------+-----------+----------+--------------+  SFJ      Full           Yes      Yes                                 +---------+---------------+---------+-----------+----------+--------------+ FV Prox  Full                                                        +---------+---------------+---------+-----------+----------+--------------+ FV Mid   Full                                                        +---------+---------------+---------+-----------+----------+--------------+ FV DistalFull                                                        +---------+---------------+---------+-----------+----------+--------------+ PFV      Full                                                        +---------+---------------+---------+-----------+----------+--------------+ POP      Full           Yes      Yes                                 +---------+---------------+---------+-----------+----------+--------------+ PTV      None           Yes      Yes                                 +---------+---------------+---------+-----------+----------+--------------+ PERO     None           No       No                                   +---------+---------------+---------+-----------+----------+--------------+ Deep vein thrombosis noted in one of the paired posterior tibial veins and one of the paired peroneal veins.  +---------+---------------+---------+-----------+----------+--------------+ LEFT     CompressibilityPhasicitySpontaneityPropertiesThrombus Aging +---------+---------------+---------+-----------+----------+--------------+ CFV      Full           Yes      Yes                                 +---------+---------------+---------+-----------+----------+--------------+ SFJ      Full           Yes      Yes                                 +---------+---------------+---------+-----------+----------+--------------+  FV Prox  Full                                                        +---------+---------------+---------+-----------+----------+--------------+ FV Mid   Full                                                        +---------+---------------+---------+-----------+----------+--------------+ FV DistalFull                                                        +---------+---------------+---------+-----------+----------+--------------+ PFV      Full                                                        +---------+---------------+---------+-----------+----------+--------------+ POP      Full           Yes      Yes                                 +---------+---------------+---------+-----------+----------+--------------+ PTV      Full                                                        +---------+---------------+---------+-----------+----------+--------------+ PERO     Full                                                        +---------+---------------+---------+-----------+----------+--------------+    Summary: RIGHT: - Findings consistent with acute deep vein thrombosis involving the right posterior tibial veins, and right peroneal  veins.  - No cystic structure found in the popliteal fossa.  LEFT: - There is no evidence of deep vein thrombosis in the lower extremity.  - No cystic structure found in the popliteal fossa.  *See table(s) above for measurements and observations.    Preliminary    EEG adult Result Date: 06/21/2024 Shelton Arlin KIDD, MD     06/21/2024  9:56 AM Patient Name: Nadalee Neiswender MRN: [id number] Epilepsy Attending: Arlin KIDD Shelton Referring Physician/Provider: Sherlon Brayton RAMAN, MD Date: 06/21/2024 Duration: 22.24 mins Patient history: 63yo F with ams. EEG to evaluate for seizure Level of alertness: Awake AEDs during EEG study: None Technical aspects: This EEG study was done with scalp electrodes positioned according to the 10-20 International system of electrode placement. Electrical activity was reviewed with band pass filter of 1-70Hz , sensitivity of 7 uV/mm, display speed of 71mm/sec with a 60Hz  notched  filter applied as appropriate. EEG data were recorded continuously and digitally stored.  Video monitoring was available and reviewed as appropriate. Description: The posterior dominant rhythm consists of 8-9 Hz activity of moderate voltage (25-35 uV) seen predominantly in posterior head regions, symmetric and reactive to eye opening and eye closing. EEG showed intermittent generalized 5 to 6 Hz theta slowing. Hyperventilation and photic stimulation were not performed.   ABNORMALITY - Intermittent slow, generalized IMPRESSION: This study is suggestive of mild generalized non specific cerebral dysfunction (encephalopathy). No seizures or epileptiform discharges were seen throughout the recording. Arlin MALVA Krebs   MR BRAIN WO CONTRAST Result Date: 06/20/2024 EXAM: MRI BRAIN WITHOUT CONTRAST 06/20/2024 11:36:24 PM TECHNIQUE: Multiplanar multisequence MRI of the head/brain was performed without the administration of intravenous contrast. COMPARISON: Head CT from 06/19/2024 as well as recent MRI from  05/31/2024. CLINICAL HISTORY: Mental status change, unknown cause; recent embolic stroke, with worsening mental state. FINDINGS: BRAIN AND VENTRICLES: There has been interval evolution of previously identified embolic infarcts involving the anterior and posterior circulation now subacute in appearance. Persistent diffusion signal abnormality about several of these infarcts, while several others have resolved since prior. Associated T2/FLAIR signal abnormality about the subacute right PCA territory infarct without significant regional mass effect or edema. No associated hemorrhage. No new areas of acute or interval infarction. No mass. No midline shift. No hydrocephalus. The sella is unremarkable. Normal flow voids. ORBITS: No acute abnormality. SINUSES AND MASTOIDS: Small bilateral mastoid effusions noted. Nasopharynx unremarkable. BONES AND SOFT TISSUES: Normal marrow signal. No acute soft tissue abnormality. IMPRESSION: 1. Interval evolution of previously identified embolic infarcts involving the anterior and posterior circulations, now subacute in appearance. No significant associated mass effect or evidence for hemorrhagic transformation. 2. No new acute or interval infarction. 3. No other acute intracranial abnormality. Electronically signed by: Morene Hoard MD 06/20/2024 11:49 PM EDT RP Workstation: [id number]   CT ABDOMEN PELVIS W CONTRAST Result Date: 06/19/2024 CLINICAL DATA:  Abdominal pain, acute, nonlocalized EXAM: CT ABDOMEN AND PELVIS WITH CONTRAST TECHNIQUE: Multidetector CT imaging of the abdomen and pelvis was performed using the standard protocol following bolus administration of intravenous contrast. RADIATION DOSE REDUCTION: This exam was performed according to the departmental dose-optimization program which includes automated exposure control, adjustment of the mA and/or kV according to patient size and/or use of iterative reconstruction technique. CONTRAST:  75mL OMNIPAQUE  IOHEXOL   350 MG/ML SOLN COMPARISON:  05/04/2024 FINDINGS: Lower chest: No focal airspace consolidation or pleural effusion.Incidentally noted, proximal segmental pulmonary emboli in the right lower lobe. Dense atherosclerosis and possible stenting of the LAD. Hepatobiliary: No mass.Cholecystectomy. Mild dilation of the intrahepatic and extrahepatic bile ducts, likely related to the prior cholecystectomy. The portal veins are patent. Pancreas: Diffuse fatty atrophy of the pancreatic parenchyma. No mass or ductal dilation. No peripancreatic inflammation or fluid collection. Spleen: Wedge-shaped region of hypoattenuation in the posteromedial aspect of the spleen, measuring up to 1.7 cm, likely splenic infarct. Adrenals/Urinary Tract: No adrenal masses. Similar mass effect and anterior displacement of the left kidney. No renal mass. No nephrolithiasis or hydronephrosis. Partially distended urinary bladder without visualized abnormality. Stomach/Bowel: The stomach is decompressed without focal abnormality. 2.1 cm periampullary duodenum diverticulum. No small bowel wall thickening or inflammation. No small bowel obstruction.Normal appendix. Vascular/Lymphatic: No aortic aneurysm. Scattered aortoiliac atherosclerosis. Subcentimeter paraesophageal lymph node along the right ward aspect of the distal esophagus measuring 7 mm, unchanged. In the posteromedial, left pararenal space, there is a large centrally necrotic mass with peripheral enhancement measuring  8.5 x 7.5 x 10.2 cm (APxTRxCC), mostly centered within the left psoas musculature, causing significant mass effect on the posteromedial aspect of the left kidney. The mass at least abuts the posterolateral wall of the infrarenal aorta and the left renal collecting system at the UPJ and proximal ureteral segment. Subtle erosive changes of the leftward anterior aspect of the L2 vertebral body (axial 46), unchanged. Interval decrease in size of the left external iliac chain lymph  node (axial 73), now measuring 0.8 cm (previously 1.8 cm). Reproductive: Hysterectomy. No concerning adnexal mass.No free pelvic fluid. Other: No pneumoperitoneum or ascites.  Mild anasarca. Musculoskeletal: No acute fracture or destructive lesion. Multilevel degenerative disc disease of the spine. IMPRESSION: 1. Incidentally noted proximal segmental pulmonary emboli in the right lower lobe. No findings to suggest right heart strain. 2. Small wedge-shaped region of hypoattenuation in the posteromedial aspect of the spleen measuring up to 1.7 cm, consistent with a splenic infarct. 3. Overall, similar size and appearance of the centrally necrotic, infiltrative mass in the left psoas muscle, measuring 8.5 x 7.5 x 10.2 cm. The mass abuts the left renal collecting system, without associated hydronephrosis. 4. Decrease in size of the left external iliac chain lymph node (axial 73), now measuring 0.8 cm (previously, 1.8 cm). Critical Value/emergent results were called by telephone at the time of interpretation on 06/19/2024 at 4:09 pm to provider [facility], who verbally acknowledged these results. Aortic Atherosclerosis (ICD10-I70.0). Electronically Signed   By: Rogelia Myers M.D.   On: 06/19/2024 16:15   CT Head Wo Contrast Result Date: 06/19/2024 CLINICAL DATA:  Recent stroke, increased confusion EXAM: CT HEAD WITHOUT CONTRAST TECHNIQUE: Contiguous axial images were obtained from the base of the skull through the vertex without intravenous contrast. RADIATION DOSE REDUCTION: This exam was performed according to the departmental dose-optimization program which includes automated exposure control, adjustment of the mA and/or kV according to patient size and/or use of iterative reconstruction technique. COMPARISON:  MRI 05/31/2024 FINDINGS: CT HEAD: There is an evolving infarct in the right posterior and medial temporal lobe with some cortical mineralization. There is no hemorrhage. No new ischemic changes.  No mass lesion. The ventricles are normal. Skull/sinuses/orbits: No significant abnormality. IMPRESSION: Evolving infarct in the right posterior and medial temporal lobe No new infarct. Electronically Signed   By: Nancyann Burns M.D.   On: 06/19/2024 16:00   VAS US  LOWER EXTREMITY VENOUS (DVT) Result Date: 06/02/2024  Lower Venous DVT Study Patient Name:  MAKAIYA GEERDES  Date of Exam:   06/02/2024 Medical Rec #: [id number]            Accession #:    [id number] Date of Birth: 1960/12/14            Patient Gender: F Patient Age:   77 years Exam Location:  [facility] Procedure:      VAS US  LOWER EXTREMITY VENOUS (DVT) Referring Phys: ARY XU --------------------------------------------------------------------------------  Indications: Stroke.  Risk Factors: Metastatic cancer, chemotherapy. Comparison Study: Previous exam on 02/09/2021 was negative for DVT Performing Technologist: Ezzie Potters RVT, RDMS  Examination Guidelines: A complete evaluation includes B-mode imaging, spectral Doppler, color Doppler, and power Doppler as needed of all accessible portions of each vessel. Bilateral testing is considered an integral part of a complete examination. Limited examinations for reoccurring indications may be performed as noted. The reflux portion of the exam is performed with the patient in reverse Trendelenburg.  +---------+---------------+---------+-----------+----------+-------------------+ RIGHT    CompressibilityPhasicitySpontaneityPropertiesThrombus Aging      +---------+---------------+---------+-----------+----------+-------------------+  CFV      Full           Yes      Yes                                      +---------+---------------+---------+-----------+----------+-------------------+ SFJ      Full                                                             +---------+---------------+---------+-----------+----------+-------------------+ FV Prox  Full           Yes      Yes                                       +---------+---------------+---------+-----------+----------+-------------------+ FV Mid   Full           Yes      Yes                                      +---------+---------------+---------+-----------+----------+-------------------+ FV DistalFull           Yes      Yes                                      +---------+---------------+---------+-----------+----------+-------------------+ PFV      Full                                                             +---------+---------------+---------+-----------+----------+-------------------+ POP      Full           Yes      Yes                                      +---------+---------------+---------+-----------+----------+-------------------+ PTV      Full                                                             +---------+---------------+---------+-----------+----------+-------------------+ PERO     None           No       No                   Acute one of paired +---------+---------------+---------+-----------+----------+-------------------+   +---------+---------------+---------+-----------+----------+--------------+ LEFT     CompressibilityPhasicitySpontaneityPropertiesThrombus Aging +---------+---------------+---------+-----------+----------+--------------+ CFV      Full           Yes      Yes                                 +---------+---------------+---------+-----------+----------+--------------+  SFJ      Full                                                        +---------+---------------+---------+-----------+----------+--------------+ FV Prox  Full           Yes      Yes                                 +---------+---------------+---------+-----------+----------+--------------+ FV Mid   Full           Yes      Yes                                 +---------+---------------+---------+-----------+----------+--------------+ FV DistalFull            Yes      Yes                                 +---------+---------------+---------+-----------+----------+--------------+ PFV      Full                                                        +---------+---------------+---------+-----------+----------+--------------+ POP      Full           Yes      Yes                                 +---------+---------------+---------+-----------+----------+--------------+ PTV      Full                                                        +---------+---------------+---------+-----------+----------+--------------+ PERO     Full                                                        +---------+---------------+---------+-----------+----------+--------------+     Summary: BILATERAL: -No evidence of popliteal cyst, bilaterally. RIGHT: - Findings consistent with acute deep vein thrombosis involving the right peroneal veins.   LEFT: - There is no evidence of deep vein thrombosis in the lower extremity.  *See table(s) above for measurements and observations. Electronically signed by Debby Robertson on 06/02/2024 at 4:10:55 PM.    Final    ECHOCARDIOGRAM COMPLETE Result Date: 06/01/2024    ECHOCARDIOGRAM REPORT   Patient Name:   DIAMONE WHISTLER Hugill Date of Exam: 06/01/2024 Medical Rec #:  [id number]           Height:       62.0 in Accession #:    [id number]          Weight:  187.4 lb Date of Birth:  Nov 24, 1960           BSA:          1.860 m Patient Age:    63 years            BP:           101/63 mmHg Patient Gender: F                   HR:           56 bpm. Exam Location:  Inpatient Procedure: 2D Echo, Cardiac Doppler, Color Doppler and Saline Contrast Bubble            Study (Both Spectral and Color Flow Doppler were utilized during            procedure). Indications:    Stroke  History:        Patient has prior history of Echocardiogram examinations, most                 recent 04/21/2024. CHF, CAD, Signs/Symptoms:Dyspnea; Risk                  Factors:Hypertension, Dyslipidemia, Sleep Apnea and Diabetes.  Sonographer:    Philomena Daring Referring Phys: ERIC J [place] IMPRESSIONS  1. Left ventricular ejection fraction, by estimation, is 55 to 60%. The left ventricle has normal function. The left ventricle has no regional wall motion abnormalities. Left ventricular diastolic parameters are consistent with Grade II diastolic dysfunction (pseudonormalization).  2. Right ventricular systolic function is low normal. The right ventricular size is normal.  3. The mitral valve is normal in structure. No evidence of mitral valve regurgitation. No evidence of mitral stenosis.  4. The aortic valve is calcified. There is moderate calcification of the aortic valve. There is moderate thickening of the aortic valve. Aortic valve regurgitation is not visualized. Mild aortic valve stenosis. Aortic valve area, by VTI measures 1.47 cm. Aortic valve mean gradient measures 15.0 mmHg. Aortic valve Vmax measures 2.71 m/s.  5. The inferior vena cava is normal in size with greater than 50% respiratory variability, suggesting right atrial pressure of 3 mmHg. FINDINGS  Left Ventricle: Left ventricular ejection fraction, by estimation, is 55 to 60%. The left ventricle has normal function. The left ventricle has no regional wall motion abnormalities. The left ventricular internal cavity size was normal in size. There is  no left ventricular hypertrophy. Left ventricular diastolic parameters are consistent with Grade II diastolic dysfunction (pseudonormalization). Right Ventricle: The right ventricular size is normal. No increase in right ventricular wall thickness. Right ventricular systolic function is low normal. Left Atrium: Left atrial size was normal in size. Right Atrium: Right atrial size was normal in size. Pericardium: There is no evidence of pericardial effusion. Mitral Valve: The mitral valve is normal in structure. No evidence of mitral valve regurgitation. No evidence of  mitral valve stenosis. Tricuspid Valve: The tricuspid valve is normal in structure. Tricuspid valve regurgitation is not demonstrated. No evidence of tricuspid stenosis. Aortic Valve: The aortic valve is calcified. There is moderate calcification of the aortic valve. There is moderate thickening of the aortic valve. Aortic valve regurgitation is not visualized. Mild aortic stenosis is present. Aortic valve mean gradient measures 15.0 mmHg. Aortic valve peak gradient measures 29.4 mmHg. Aortic valve area, by VTI measures 1.47 cm. Pulmonic Valve: The pulmonic valve was normal in structure. Pulmonic valve regurgitation is not visualized. No evidence of pulmonic stenosis. Aorta: The aortic  root is normal in size and structure. Venous: The inferior vena cava is normal in size with greater than 50% respiratory variability, suggesting right atrial pressure of 3 mmHg. IAS/Shunts: No atrial level shunt detected by color flow Doppler. Agitated saline contrast was given intravenously to evaluate for intracardiac shunting.  LEFT VENTRICLE PLAX 2D LVIDd:         5.20 cm   Diastology LVIDs:         3.60 cm   LV e' medial:    6.20 cm/s LV PW:         1.20 cm   LV E/e' medial:  19.5 LV IVS:        1.00 cm   LV e' lateral:   6.42 cm/s LVOT diam:     2.00 cm   LV E/e' lateral: 18.8 LV SV:         90 LV SV Index:   48 LVOT Area:     3.14 cm  RIGHT VENTRICLE            IVC RV S prime:     8.05 cm/s  IVC diam: 1.80 cm TAPSE (M-mode): 1.5 cm LEFT ATRIUM             Index        RIGHT ATRIUM           Index LA diam:        3.80 cm 2.04 cm/m   RA Area:     12.20 cm LA Vol (A2C):   52.0 ml 27.96 ml/m  RA Volume:   23.10 ml  12.42 ml/m LA Vol (A4C):   50.1 ml 26.94 ml/m LA Biplane Vol: 52.2 ml 28.07 ml/m  AORTIC VALVE AV Area (Vmax):    1.36 cm AV Area (Vmean):   1.36 cm AV Area (VTI):     1.47 cm AV Vmax:           271.00 cm/s AV Vmean:          179.250 cm/s AV VTI:            0.612 m AV Peak Grad:      29.4 mmHg AV Mean Grad:       15.0 mmHg LVOT Vmax:         117.00 cm/s LVOT Vmean:        77.600 cm/s LVOT VTI:          0.286 m LVOT/AV VTI ratio: 0.47  AORTA Ao Root diam: 2.80 cm Ao Asc diam:  3.50 cm MITRAL VALVE MV Area (PHT): 2.14 cm     SHUNTS MV Decel Time: 355 msec     Systemic VTI:  0.29 m MV E velocity: 121.00 cm/s  Systemic Diam: 2.00 cm MV A velocity: 123.00 cm/s MV E/A ratio:  0.98 Morene Brownie Electronically signed by Morene Brownie Signature Date/Time: 06/01/2024/9:38:18 PM    Final    EEG adult Result Date: 06/01/2024 Matthews Elida HERO, MD     06/01/2024  7:53 AM Routine EEG Report Sharnika Binney is a 63 y.o. female with a history of altered mental status who is undergoing an EEG to evaluate for seizures. Report: This EEG was acquired with electrodes placed according to the International 10-20 electrode system (including Fp1, Fp2, F3, F4, C3, C4, P3, P4, O1, O2, T3, T4, T5, T6, A1, A2, Fz, Cz, Pz). The following electrodes were missing or displaced: none. The occipital dominant rhythm was 6-7 Hz. This activity is reactive to  stimulation. Drowsiness was manifested by background fragmentation; deeper stages of sleep were not identified. There was no focal slowing. There were no interictal epileptiform discharges. There were no electrographic seizures identified. Photic stimulation and hyperventilation were not performed. Impression and clinical correlation: This EEG was obtained while awake and drowsy and is abnormal due to mild diffuse slowing indicative of global cerebral dysfunction. Epileptiform abnormalities were not seen during this recording. [name], MD [facility] [phone] If 7pm- 7am, please page neurology on call as listed in AMION.   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 Confusion, weakness, neurologic deficit. EXAM: MRI HEAD WITHOUT AND WITH CONTRAST TECHNIQUE: Multiplanar, multiecho pulse sequences  of the brain and surrounding structures were obtained without and with intravenous contrast. CONTRAST:  8mL GADAVIST  GADOBUTROL  1 MMOL/ML IV SOLN COMPARISON:  Head CT without contrast 0101 hours today. Brain MRI 04/21/2024. FINDINGS: Brain: Confluent restricted diffusion in the right PCA territory from the posterior mesial temporal lobe, patchy right occipital lobe involvement, periatrial white matter involvement and marginal involvement of the splenium of the corpus callosum. Additional numerous small foci of restricted diffusion scattered in the bilateral cerebellum and cerebral hemispheres. Anterior and posterior circulation affected bilaterally. Deep gray nuclei and brainstem are relatively spared. Cytotoxic edema in the affected areas. No intracranial hemorrhage identified. Luxury perfusion enhancement appearance in the right PCA territory (series 17, image 13). No masslike or suspicious postcontrast enhancement. No dural thickening. No midline shift, ventriculomegaly, evidence of discrete intracranial mass. Cervicomedullary junction and pituitary are within normal limits. No abnormal enhancement identified. Evidence of mass lesion, ventriculomegaly, extra-axial collection or acute intracranial hemorrhage. Cervicomedullary junction and pituitary are within normal limits. Vascular: Major intracranial vascular flow voids are preserved. Skull and upper cervical spine: Visualized bone marrow signal is within normal limits. Negative visible cervical spine and spinal cord. Sinuses/Orbits: Negative. Other: Stable mild mastoid effusion since last month. IMPRESSION: 1. Numerous small acute embolic infarcts in the bilateral anterior and posterior circulation, with superimposed confluent Right PCA territory infarct corresponding to that seen on CT this morning. 2. Cytotoxic edema with no intracranial hemorrhage or mass effect. 3. No intracranial metastatic disease identified. Electronically Signed   By: VEAR Hurst M.D.   On:  05/31/2024 10:49   CT Head Wo Contrast Result Date: 05/31/2024 CLINICAL DATA:  Confusion, weakness, mental status changes EXAM: CT HEAD WITHOUT CONTRAST TECHNIQUE: Contiguous axial images were obtained from the base of the skull through the vertex without intravenous contrast. RADIATION DOSE REDUCTION: This exam was performed according to the departmental dose-optimization program which includes automated exposure control, adjustment of the mA and/or kV according to patient size and/or use of iterative reconstruction technique. COMPARISON:  04/20/2024 FINDINGS: Brain: Area of low-density noted in the right posterior temporal and occipital lobes adjacent to the right lateral ventricle, new since prior study concerning for acute to subacute infarcts. No hemorrhage or hydrocephalus. No mass effect or midline shift. Vascular: No hyperdense vessel or unexpected calcification. Skull: No acute calvarial abnormality. Sinuses/Orbits: No acute findings Other: None IMPRESSION: New low-density areas in the posterior right temporal lobe and occipital lobe concerning for acute to subacute infarcts. These are new since prior study. Electronically Signed   By: Franky Crease M.D.   On: 05/31/2024 01:20   DG Chest Port 1 View Result Date: 05/31/2024 CLINICAL DATA:  Altered mental status EXAM: PORTABLE CHEST 1 VIEW COMPARISON:  05/21/2024 FINDINGS: Stable chronic elevation of the right hemidiaphragm.  Right Port-A-Cath remains in place, unchanged. Cardiomediastinal contours are unchanged. No confluent airspace opacities, effusions or edema. No acute bony abnormality. IMPRESSION: No active disease. Electronically Signed   By: Franky Crease M.D.   On: 05/31/2024 00:20      Subjective: She denies any further visual hallucinations, reports lower extremity neuropathic pain, requesting her Lyrica  to be resumed.  Discharge Exam: Vitals:   06/22/24 0740 06/22/24 1155  BP: 139/63 (!) 130/48  Pulse: 86 77  Resp: 10 11  Temp:  97.8 F (36.6 C) (!) 97.5 F (36.4 C)  SpO2: 100% 100%   Vitals:   06/22/24 0029 06/22/24 0505 06/22/24 0740 06/22/24 1155  BP: (!) 156/52 (!) 127/53 139/63 (!) 130/48  Pulse: 83 75 86 77  Resp: 20 18 10 11   Temp: 97.8 F (36.6 C) (!) 97.4 F (36.3 C) 97.8 F (36.6 C) (!) 97.5 F (36.4 C)  TempSrc: Axillary Axillary Oral Oral  SpO2: 95% 100% 100% 100%  Weight:      Height:        General: Pt is alert, awake, not in acute distress Cardiovascular: RRR, S1/S2 +, no rubs, no gallops Respiratory: CTA bilaterally, no wheezing, no rhonchi Abdominal: Soft, NT, ND, bowel sounds + Extremities: no edema, no cyanosis    The results of significant diagnostics from this hospitalization (including imaging, microbiology, ancillary and laboratory) are listed below for reference.     Microbiology: Recent Results (from the past 240 hours)  Urine Culture     Status: Abnormal   Collection Time: 06/19/24  6:02 PM   Specimen: Urine, Clean Catch  Result Value Ref Range Status   Specimen Description URINE, CLEAN CATCH  Final   Special Requests   Final    NONE Performed at [facility], [address] [place], [place] [address]    Culture >=100,000 COLONIES/mL KLEBSIELLA PNEUMONIAE (A)  Final   Report Status 06/22/2024 FINAL  Final   Organism ID, Bacteria KLEBSIELLA PNEUMONIAE (A)  Final      Susceptibility   Klebsiella pneumoniae - MIC*    AMPICILLIN >=32 RESISTANT Resistant     CEFAZOLIN  (URINE) Value in next row Sensitive      2 SENSITIVEThis is a modified FDA-approved test that has been validated and its performance characteristics determined by the reporting laboratory.  This laboratory is certified under the Clinical Laboratory Improvement Amendments CLIA as qualified to perform high complexity clinical laboratory testing.    CEFEPIME  Value in next row Sensitive      2 SENSITIVEThis is a modified FDA-approved test that has been validated and its performance characteristics  determined by the reporting laboratory.  This laboratory is certified under the Clinical Laboratory Improvement Amendments CLIA as qualified to perform high complexity clinical laboratory testing.    ERTAPENEM Value in next row Sensitive      2 SENSITIVEThis is a modified FDA-approved test that has been validated and its performance characteristics determined by the reporting laboratory.  This laboratory is certified under the Clinical Laboratory Improvement Amendments CLIA as qualified to perform high complexity clinical laboratory testing.    CEFTRIAXONE  Value in next row Sensitive      2 SENSITIVEThis is a modified FDA-approved test that has been validated and its performance characteristics determined by the reporting laboratory.  This laboratory is certified under the Clinical Laboratory Improvement Amendments CLIA as qualified to perform high complexity clinical laboratory testing.    CIPROFLOXACIN  Value in next row Sensitive      2  SENSITIVEThis is a modified FDA-approved test that has been validated and its performance characteristics determined by the reporting laboratory.  This laboratory is certified under the Clinical Laboratory Improvement Amendments CLIA as qualified to perform high complexity clinical laboratory testing.    GENTAMICIN Value in next row Sensitive      2 SENSITIVEThis is a modified FDA-approved test that has been validated and its performance characteristics determined by the reporting laboratory.  This laboratory is certified under the Clinical Laboratory Improvement Amendments CLIA as qualified to perform high complexity clinical laboratory testing.    NITROFURANTOIN Value in next row Intermediate      2 SENSITIVEThis is a modified FDA-approved test that has been validated and its performance characteristics determined by the reporting laboratory.  This laboratory is certified under the Clinical Laboratory Improvement Amendments CLIA as qualified to perform high complexity  clinical laboratory testing.    TRIMETH /SULFA  Value in next row Sensitive      2 SENSITIVEThis is a modified FDA-approved test that has been validated and its performance characteristics determined by the reporting laboratory.  This laboratory is certified under the Clinical Laboratory Improvement Amendments CLIA as qualified to perform high complexity clinical laboratory testing.    AMPICILLIN/SULBACTAM Value in next row Sensitive      2 SENSITIVEThis is a modified FDA-approved test that has been validated and its performance characteristics determined by the reporting laboratory.  This laboratory is certified under the Clinical Laboratory Improvement Amendments CLIA as qualified to perform high complexity clinical laboratory testing.    PIP/TAZO Value in next row Sensitive      <=4 SENSITIVEThis is a modified FDA-approved test that has been validated and its performance characteristics determined by the reporting laboratory.  This laboratory is certified under the Clinical Laboratory Improvement Amendments CLIA as qualified to perform high complexity clinical laboratory testing.    MEROPENEM Value in next row Sensitive      <=4 SENSITIVEThis is a modified FDA-approved test that has been validated and its performance characteristics determined by the reporting laboratory.  This laboratory is certified under the Clinical Laboratory Improvement Amendments CLIA as qualified to perform high complexity clinical laboratory testing.    * >=100,000 COLONIES/mL KLEBSIELLA PNEUMONIAE     Labs: BNP (last 3 results) Recent Labs    06/19/24 1730  BNP 161.6*   Basic Metabolic Panel: Recent Labs  Lab 06/19/24 1004 06/20/24 0115 06/21/24 0430 06/22/24 0341  NA 131* 134* 133* 137  K 3.9 3.9 4.0 4.0  CL 94* 98 97* 101  CO2 21* 23 25 26   GLUCOSE 236* 125* 300* 262*  BUN 9 7* 8 12  CREATININE 0.64 0.57 0.63 0.58  CALCIUM  9.1 8.7* 8.8* 8.7*  MG  --  1.0* 1.3* 1.6*  PHOS  --  3.9 3.2 3.5   Liver  Function Tests: Recent Labs  Lab 06/19/24 1004  AST 15  ALT 10  ALKPHOS 77  BILITOT 0.6  PROT 6.9  ALBUMIN 2.9*   No results for input(s): LIPASE, AMYLASE in the last 168 hours. No results for input(s): AMMONIA in the last 168 hours. CBC: Recent Labs  Lab 06/19/24 1004 06/20/24 0115 06/21/24 0430 06/22/24 0341  WBC 11.6* 10.6* 7.9 7.5  HGB 10.1* 9.2* 9.1* 8.7*  HCT 32.0* 29.6* 28.8* 27.4*  MCV 85.6 86.5 86.2 86.2  PLT 310 284 300 299   Cardiac Enzymes: No results for input(s): CKTOTAL, CKMB, CKMBINDEX, TROPONINI in the last 168 hours. BNP: Invalid input(s): POCBNP CBG: Recent Labs  Lab 06/21/24 1212 06/21/24 1527 06/21/24 2150 06/22/24 0739 06/22/24 1156  GLUCAP 352* 227* 221* 287* 358*   D-Dimer No results for input(s): DDIMER in the last 72 hours. Hgb A1c No results for input(s): HGBA1C in the last 72 hours. Lipid Profile No results for input(s): CHOL, HDL, LDLCALC, TRIG, CHOLHDL, LDLDIRECT in the last 72 hours. Thyroid  function studies No results for input(s): TSH, T4TOTAL, T3FREE, THYROIDAB in the last 72 hours.  Invalid input(s): FREET3 Anemia work up No results for input(s): VITAMINB12, FOLATE, FERRITIN, TIBC, IRON, RETICCTPCT in the last 72 hours. Urinalysis    Component Value Date/Time   COLORURINE AMBER (A) 06/19/2024 1001   APPEARANCEUR CLOUDY (A) 06/19/2024 1001   LABSPEC 1.020 06/19/2024 1001   PHURINE 5.0 06/19/2024 1001   GLUCOSEU NEGATIVE 06/19/2024 1001   HGBUR NEGATIVE 06/19/2024 1001   BILIRUBINUR NEGATIVE 06/19/2024 1001   BILIRUBINUR neg 08/16/2016 1147   KETONESUR 5 (A) 06/19/2024 1001   PROTEINUR 30 (A) 06/19/2024 1001   UROBILINOGEN 2.0 08/16/2016 1147   UROBILINOGEN 2.0 (H) 02/22/2015 2143   NITRITE NEGATIVE 06/19/2024 1001   LEUKOCYTESUR MODERATE (A) 06/19/2024 1001   Sepsis Labs Recent Labs  Lab 06/19/24 1004 06/20/24 0115 06/21/24 0430 06/22/24 0341  WBC 11.6*  10.6* 7.9 7.5   Microbiology Recent Results (from the past 240 hours)  Urine Culture     Status: Abnormal   Collection Time: 06/19/24  6:02 PM   Specimen: Urine, Clean Catch  Result Value Ref Range Status   Specimen Description URINE, CLEAN CATCH  Final   Special Requests   Final    NONE Performed at [facility], [address] [place], [place] [address]    Culture >=100,000 COLONIES/mL KLEBSIELLA PNEUMONIAE (A)  Final   Report Status 06/22/2024 FINAL  Final   Organism ID, Bacteria KLEBSIELLA PNEUMONIAE (A)  Final      Susceptibility   Klebsiella pneumoniae - MIC*    AMPICILLIN >=32 RESISTANT Resistant     CEFAZOLIN  (URINE) Value in next row Sensitive      2 SENSITIVEThis is a modified FDA-approved test that has been validated and its performance characteristics determined by the reporting laboratory.  This laboratory is certified under the Clinical Laboratory Improvement Amendments CLIA as qualified to perform high complexity clinical laboratory testing.    CEFEPIME  Value in next row Sensitive      2 SENSITIVEThis is a modified FDA-approved test that has been validated and its performance characteristics determined by the reporting laboratory.  This laboratory is certified under the Clinical Laboratory Improvement Amendments CLIA as qualified to perform high complexity clinical laboratory testing.    ERTAPENEM Value in next row Sensitive      2 SENSITIVEThis is a modified FDA-approved test that has been validated and its performance characteristics determined by the reporting laboratory.  This laboratory is certified under the Clinical Laboratory Improvement Amendments CLIA as qualified to perform high complexity clinical laboratory testing.    CEFTRIAXONE  Value in next row Sensitive      2 SENSITIVEThis is a modified FDA-approved test that has been validated and its performance characteristics determined by the reporting laboratory.  This laboratory is certified under the Clinical  Laboratory Improvement Amendments CLIA as qualified to perform high complexity clinical laboratory testing.    CIPROFLOXACIN  Value in next row Sensitive      2 SENSITIVEThis is a modified FDA-approved test that has been validated and its performance characteristics determined by the reporting laboratory.  This laboratory is certified  under the Clinical Laboratory Improvement Amendments CLIA as qualified to perform high complexity clinical laboratory testing.    GENTAMICIN Value in next row Sensitive      2 SENSITIVEThis is a modified FDA-approved test that has been validated and its performance characteristics determined by the reporting laboratory.  This laboratory is certified under the Clinical Laboratory Improvement Amendments CLIA as qualified to perform high complexity clinical laboratory testing.    NITROFURANTOIN Value in next row Intermediate      2 SENSITIVEThis is a modified FDA-approved test that has been validated and its performance characteristics determined by the reporting laboratory.  This laboratory is certified under the Clinical Laboratory Improvement Amendments CLIA as qualified to perform high complexity clinical laboratory testing.    TRIMETH /SULFA  Value in next row Sensitive      2 SENSITIVEThis is a modified FDA-approved test that has been validated and its performance characteristics determined by the reporting laboratory.  This laboratory is certified under the Clinical Laboratory Improvement Amendments CLIA as qualified to perform high complexity clinical laboratory testing.    AMPICILLIN/SULBACTAM Value in next row Sensitive      2 SENSITIVEThis is a modified FDA-approved test that has been validated and its performance characteristics determined by the reporting laboratory.  This laboratory is certified under the Clinical Laboratory Improvement Amendments CLIA as qualified to perform high complexity clinical laboratory testing.    PIP/TAZO Value in next row Sensitive       <=4 SENSITIVEThis is a modified FDA-approved test that has been validated and its performance characteristics determined by the reporting laboratory.  This laboratory is certified under the Clinical Laboratory Improvement Amendments CLIA as qualified to perform high complexity clinical laboratory testing.    MEROPENEM Value in next row Sensitive      <=4 SENSITIVEThis is a modified FDA-approved test that has been validated and its performance characteristics determined by the reporting laboratory.  This laboratory is certified under the Clinical Laboratory Improvement Amendments CLIA as qualified to perform high complexity clinical laboratory testing.    * >=100,000 COLONIES/mL KLEBSIELLA PNEUMONIAE     Time coordinating discharge: Over 30 minutes  SIGNED:   Brayton Lye, MD  [facility] 06/22/2024, 1:57 PM Pager   If 7PM-7AM, please contact night-coverage [email or web address]

## 2024-06-23 ENCOUNTER — Inpatient Hospital Stay: Admitting: Hematology and Oncology

## 2024-06-23 ENCOUNTER — Inpatient Hospital Stay

## 2024-06-23 ENCOUNTER — Telehealth: Payer: Self-pay

## 2024-06-23 ENCOUNTER — Other Ambulatory Visit: Payer: Self-pay | Admitting: Hematology and Oncology

## 2024-06-23 NOTE — Telephone Encounter (Signed)
 Brenda [name] with [facility] called for a verbal order:  In home physical therapy  1 time a week for 2 weeks, twice a week for 3 weeks the 1x a week for 4 weeks.  To follow up on depression, and to improve balance & strength.   She also reported patient using the O2 as prescribed.  (I have informed [facility]  that Brenda [name] will not be follow here at [facility]. They may call the PCP for home health orders. If there is a issue please call back).

## 2024-06-24 ENCOUNTER — Other Ambulatory Visit (HOSPITAL_COMMUNITY): Payer: Self-pay

## 2024-06-29 ENCOUNTER — Inpatient Hospital Stay

## 2024-06-29 ENCOUNTER — Other Ambulatory Visit (HOSPITAL_COMMUNITY): Payer: Self-pay

## 2024-06-29 ENCOUNTER — Encounter: Payer: Self-pay | Admitting: Hematology and Oncology

## 2024-06-29 ENCOUNTER — Inpatient Hospital Stay: Admitting: Hematology and Oncology

## 2024-06-29 VITALS — BP 136/66 | HR 109 | Temp 97.6°F | Resp 18 | Ht 62.0 in | Wt 174.2 lb

## 2024-06-29 DIAGNOSIS — Z5111 Encounter for antineoplastic chemotherapy: Secondary | ICD-10-CM | POA: Diagnosis present

## 2024-06-29 DIAGNOSIS — C541 Malignant neoplasm of endometrium: Secondary | ICD-10-CM

## 2024-06-29 DIAGNOSIS — Z794 Long term (current) use of insulin: Secondary | ICD-10-CM

## 2024-06-29 DIAGNOSIS — E118 Type 2 diabetes mellitus with unspecified complications: Secondary | ICD-10-CM | POA: Diagnosis not present

## 2024-06-29 DIAGNOSIS — G893 Neoplasm related pain (acute) (chronic): Secondary | ICD-10-CM

## 2024-06-29 DIAGNOSIS — Z5112 Encounter for antineoplastic immunotherapy: Secondary | ICD-10-CM | POA: Diagnosis present

## 2024-06-29 DIAGNOSIS — Z79899 Other long term (current) drug therapy: Secondary | ICD-10-CM | POA: Diagnosis not present

## 2024-06-29 LAB — CBC WITH DIFFERENTIAL (CANCER CENTER ONLY)
Abs Immature Granulocytes: 0.04 K/uL (ref 0.00–0.07)
Basophils Absolute: 0.1 K/uL (ref 0.0–0.1)
Basophils Relative: 1 %
Eosinophils Absolute: 0.2 K/uL (ref 0.0–0.5)
Eosinophils Relative: 2 %
HCT: 30.4 % — ABNORMAL LOW (ref 36.0–46.0)
Hemoglobin: 9.7 g/dL — ABNORMAL LOW (ref 12.0–15.0)
Immature Granulocytes: 0 %
Lymphocytes Relative: 15 %
Lymphs Abs: 1.5 K/uL (ref 0.7–4.0)
MCH: 26.6 pg (ref 26.0–34.0)
MCHC: 31.9 g/dL (ref 30.0–36.0)
MCV: 83.5 fL (ref 80.0–100.0)
Monocytes Absolute: 0.6 K/uL (ref 0.1–1.0)
Monocytes Relative: 6 %
Neutro Abs: 7.5 K/uL (ref 1.7–7.7)
Neutrophils Relative %: 76 %
Platelet Count: 327 K/uL (ref 150–400)
RBC: 3.64 MIL/uL — ABNORMAL LOW (ref 3.87–5.11)
RDW: 15.4 % (ref 11.5–15.5)
WBC Count: 9.9 K/uL (ref 4.0–10.5)
nRBC: 0 % (ref 0.0–0.2)

## 2024-06-29 LAB — CMP (CANCER CENTER ONLY)
ALT: 29 U/L (ref 0–44)
AST: 45 U/L — ABNORMAL HIGH (ref 15–41)
Albumin: 3.4 g/dL — ABNORMAL LOW (ref 3.5–5.0)
Alkaline Phosphatase: 83 U/L (ref 38–126)
Anion gap: 8 (ref 5–15)
BUN: 11 mg/dL (ref 8–23)
CO2: 28 mmol/L (ref 22–32)
Calcium: 9.5 mg/dL (ref 8.9–10.3)
Chloride: 101 mmol/L (ref 98–111)
Creatinine: 0.64 mg/dL (ref 0.44–1.00)
GFR, Estimated: >60 mL/min (ref >60)
Glucose, Bld: 181 mg/dL — ABNORMAL HIGH (ref 70–99)
Potassium: 3.9 mmol/L (ref 3.5–5.1)
Sodium: 137 mmol/L (ref 135–145)
Total Bilirubin: 0.3 mg/dL (ref 0.0–1.2)
Total Protein: 7.3 g/dL (ref 6.5–8.1)

## 2024-06-29 MED ORDER — INSULIN DEGLUDEC 100 UNIT/ML ~~LOC~~ SOPN: 30.0000 [IU] | PEN_INJECTOR | Freq: Every day | SUBCUTANEOUS | Status: DC

## 2024-06-29 MED ORDER — INSULIN DEGLUDEC 100 UNIT/ML ~~LOC~~ SOPN: 25.0000 [IU] | PEN_INJECTOR | Freq: Every day | SUBCUTANEOUS | Status: AC

## 2024-06-29 MED ORDER — OXYCODONE HCL 10 MG PO TABS
10.0000 mg | ORAL_TABLET | Freq: Four times a day (QID) | ORAL | 0 refills | Status: AC | PRN
  Filled 2024-06-29: qty 90, 23d supply, fill #0

## 2024-06-29 MED FILL — Fosaprepitant Dimeglumine For IV Infusion 150 MG (Base Eq): INTRAVENOUS | Qty: 5 | Status: AC

## 2024-06-29 NOTE — Assessment & Plan Note (Addendum)
 Her pain is well-controlled I refilled her prescription oxycodone 

## 2024-06-29 NOTE — Assessment & Plan Note (Addendum)
 The patient was originally diagnosed with uterine cancer in 2023, status post surgery and adjuvant radiation treatment She was noted to have cancer recurrence in July 2025 when she presented with severe back pain with CT imaging showing recurrent disease in the retroperitoneal lymph node.  CT imaging of the chest shows spiculated lung mass suspicious for metastatic spread Pathology: Left retroperitoneal mass biopsy confirmed adenocarcinoma, ER positive, p53 wild-type.   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, ER 100% positive, PD-L1 75% positive, PIK 3 CA mutated, PTEN mutated, BRCA 1 mutation not detected, BRCA2, variant of unknown significance, HER2/neu 1+, PR 2%  She was started on reduced dose combination treatment with carboplatin , paclitaxel  and pembrolizumab  end of August Between August to October, she has numerous hospitalizations for altered mental status, uncontrolled diabetes and others She was recently discharged from the hospital and was diagnosed with DVT and is anticoagulated Since returning home, she is improving Will resume chemotherapy tomorrow Given significant weight loss, I will readjust the dose of her chemotherapy and prescribe mild dose reduction of carboplatin 

## 2024-06-29 NOTE — Progress Notes (Signed)
 [facility] OFFICE PROGRESS NOTE  Patient Care Team: [facility] [place], EZEQUIEL. as PCP - Diedre Wonda Sharper, MD as PCP - Cardiology (Cardiology) Lonn Hicks, MD as PCP - Hematology/Oncology (Hematology and Oncology) Lonn Hicks, MD as Consulting Physician (Hematology and Oncology)  Assessment & Plan Endometrial carcinoma [facility]) The patient was originally diagnosed with uterine cancer in 2023, status post surgery and adjuvant radiation treatment She was noted to have cancer recurrence in July 2025 when she presented with severe back pain with CT imaging showing recurrent disease in the retroperitoneal lymph node.  CT imaging of the chest shows spiculated lung mass suspicious for metastatic spread Pathology: Left retroperitoneal mass biopsy confirmed adenocarcinoma, ER positive, p53 wild-type.   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, ER 100% positive, PD-L1 75% positive, PIK 3 CA mutated, PTEN mutated, BRCA 1 mutation not detected, BRCA2, variant of unknown significance, HER2/neu 1+, PR 2%  She was started on reduced dose combination treatment with carboplatin , paclitaxel  and pembrolizumab  end of August Between August to October, she has numerous hospitalizations for altered mental status, uncontrolled diabetes and others She was recently discharged from the hospital and was diagnosed with DVT and is anticoagulated Since returning home, she is improving Will resume chemotherapy tomorrow Given significant weight loss, I will readjust the dose of her chemotherapy and prescribe mild dose reduction of carboplatin  Type 2 diabetes mellitus with complication, with long-term current use of insulin  (HCC) She has difficult to control diabetes She was prescribed higher dose of insulin  since I last saw her Her continuous blood sugar monitor detected episodes of hypoglycemia  in the middle of the night I recommend the patient to reduce her long-acting insulin  from 30 units to 25 units Cancer related pain Her pain is well-controlled I refilled her prescription oxycodone   No orders of the defined types were placed in this encounter.    Hicks Lonn, MD  INTERVAL HISTORY: she returns for treatment follow-up Complications related to previous cycle of chemotherapy included anemia,, cancer associated pain,, and recent hospitalization/ ER visit, and control hyperglycemia Since she is back at home, she is doing well She has lost a lot of weight Her appetite is preserved She denies recent constipation Her pain is reasonably controlled Her blood sugar fluctuate significantly with hypoglycemia in the middle of the night  PHYSICAL EXAMINATION: ECOG PERFORMANCE STATUS: 2 - Symptomatic, <50% confined to bed  No results found for: [id number]    Latest Ref Rng & Units 06/29/2024   11:52 AM 06/22/2024    3:41 AM 06/21/2024    4:30 AM  CBC  WBC 4.0 - 10.5 K/uL 9.9  7.5  7.9   Hemoglobin 12.0 - 15.0 g/dL 9.7  8.7  9.1   Hematocrit 36.0 - 46.0 % 30.4  27.4  28.8   Platelets 150 - 400 K/uL 327  299  300       Chemistry      Component Value Date/Time   NA 137 06/22/2024 0341   NA 138 01/25/2023 0945   K 4.0 06/22/2024 0341   CL 101 06/22/2024 0341   CO2 26 06/22/2024 0341   BUN 12 06/22/2024 0341   BUN 13 01/25/2023 0945   CREATININE 0.58 06/22/2024 0341   CREATININE 0.53 05/28/2024 1427   CREATININE 0.83 03/29/2016 1030      Component Value Date/Time   CALCIUM  8.7 (L) 06/22/2024 0341  ALKPHOS 77 06/19/2024 1004   AST 15 06/19/2024 1004   AST 11 (L) 05/28/2024 1427   ALT 10 06/19/2024 1004   ALT 6 05/28/2024 1427   BILITOT 0.6 06/19/2024 1004   BILITOT 0.3 05/28/2024 1427       Vitals:   06/29/24 1212  BP: 136/66  Pulse: (!) 109  Resp: 18  Temp: 97.6 F (36.4 C)  SpO2: 99%   Filed Weights   06/29/24 1212  Weight: 174 lb 3.2 oz (79 kg)    Other relevant data reviewed during this visit included CT imaging from October 2025, vascular ultrasound, CBC, CMP

## 2024-06-29 NOTE — Assessment & Plan Note (Addendum)
 She has difficult to control diabetes She was prescribed higher dose of insulin  since I last saw her Her continuous blood sugar monitor detected episodes of hypoglycemia in the middle of the night I recommend the patient to reduce her long-acting insulin  from 30 units to 25 units

## 2024-06-30 ENCOUNTER — Inpatient Hospital Stay: Admitting: Licensed Clinical Social Worker

## 2024-06-30 ENCOUNTER — Other Ambulatory Visit: Payer: Self-pay

## 2024-06-30 ENCOUNTER — Other Ambulatory Visit: Payer: Self-pay | Admitting: Hematology and Oncology

## 2024-06-30 ENCOUNTER — Inpatient Hospital Stay

## 2024-06-30 ENCOUNTER — Other Ambulatory Visit (HOSPITAL_COMMUNITY): Payer: Self-pay

## 2024-06-30 VITALS — BP 138/55 | HR 73 | Temp 97.7°F | Resp 20

## 2024-06-30 DIAGNOSIS — Z5112 Encounter for antineoplastic immunotherapy: Secondary | ICD-10-CM | POA: Diagnosis not present

## 2024-06-30 DIAGNOSIS — C541 Malignant neoplasm of endometrium: Secondary | ICD-10-CM

## 2024-06-30 MED ORDER — SODIUM CHLORIDE 0.9 % IV SOLN
80.0000 mg/m2 | Freq: Once | INTRAVENOUS | Status: AC
  Administered 2024-06-30: 150 mg via INTRAVENOUS
  Filled 2024-06-30: qty 25

## 2024-06-30 MED ORDER — PROCHLORPERAZINE MALEATE 10 MG PO TABS
10.0000 mg | ORAL_TABLET | Freq: Four times a day (QID) | ORAL | 1 refills | Status: DC | PRN
  Filled 2024-06-30: qty 30, 8d supply, fill #0

## 2024-06-30 MED ORDER — FAMOTIDINE IN NACL 20-0.9 MG/50ML-% IV SOLN
20.0000 mg | Freq: Once | INTRAVENOUS | Status: AC
  Administered 2024-06-30: 20 mg via INTRAVENOUS
  Filled 2024-06-30: qty 50

## 2024-06-30 MED ORDER — SODIUM CHLORIDE 0.9 % IV SOLN
200.0000 mg | Freq: Once | INTRAVENOUS | Status: AC
  Administered 2024-06-30: 200 mg via INTRAVENOUS
  Filled 2024-06-30: qty 200

## 2024-06-30 MED ORDER — SODIUM CHLORIDE 0.9 % IV SOLN: INTRAVENOUS | Status: DC

## 2024-06-30 MED ORDER — DEXAMETHASONE SOD PHOSPHATE PF 10 MG/ML IJ SOLN
5.0000 mg | Freq: Once | INTRAMUSCULAR | Status: AC
  Administered 2024-06-30: 5 mg via INTRAVENOUS

## 2024-06-30 MED ORDER — SODIUM CHLORIDE 0.9 % IV SOLN
468.4000 mg | Freq: Once | INTRAVENOUS | Status: AC
  Administered 2024-06-30: 470 mg via INTRAVENOUS
  Filled 2024-06-30: qty 47

## 2024-06-30 MED ORDER — PALONOSETRON HCL INJECTION 0.25 MG/5ML
0.2500 mg | Freq: Once | INTRAVENOUS | Status: AC
  Administered 2024-06-30: 0.25 mg via INTRAVENOUS
  Filled 2024-06-30: qty 5

## 2024-06-30 MED ORDER — DIPHENHYDRAMINE HCL 50 MG/ML IJ SOLN
12.5000 mg | Freq: Once | INTRAMUSCULAR | Status: AC
  Administered 2024-06-30: 12.5 mg via INTRAVENOUS
  Filled 2024-06-30: qty 1

## 2024-06-30 MED ORDER — SODIUM CHLORIDE 0.9 % IV SOLN
150.0000 mg | Freq: Once | INTRAVENOUS | Status: AC
  Administered 2024-06-30: 150 mg via INTRAVENOUS
  Filled 2024-06-30 (×2): qty 5
  Filled 2024-06-30: qty 150

## 2024-06-30 NOTE — Progress Notes (Signed)
 [facility] CSW Progress Note  Clinical Child Psychotherapist met with patient to follow-up after hospital discharge.  Pt has been home for a few days and it is going well so far. Per pt, [facility]CS evaluation was done and they are waiting on decision for an in-home aide.  She is also waiting on one last piece of DME that the home health care agency is working on.     Follow Up Plan:  Virtual counseling visit 11/6 at 10am    Zadiel Leyh E Jacksen Isip, LCSW Clinical Social Worker [facility]    Patient is participating in a Managed Medicaid Plan:  Yes

## 2024-06-30 NOTE — Progress Notes (Signed)
 Patient c/o split vision at 1327.  Patient was finishing Keytruda  infusion.  VSS.  Patient denied any other changes.  Mallie Combes, PA, contacted, assessed patient at chairside.  At 1348, patient reported resolution of symptoms.  CBG 197.  Per PA, OK to continue with paclitaxel  and carboplatin .  Patient tolerated remainder of Tx without incident.

## 2024-06-30 NOTE — Progress Notes (Signed)
 OK to proceed with Tx with chest X-ray from 05/04/24 confirming PAC placement, per Dr. Lonn.

## 2024-06-30 NOTE — Patient Instructions (Addendum)
 [facility] - A DEPT OF [place]  Discharge Instructions: Thank you for choosing [facility] to provide your oncology and hematology care.   If you have a lab appointment with the Cancer Center, please go directly to the Cancer Center and check in at the registration area.   Wear comfortable clothing and clothing appropriate for easy access to any Portacath or PICC line.   We strive to give you quality time with your provider. You may need to reschedule your appointment if you arrive late (15 or more minutes).  Arriving late affects you and other patients whose appointments are after yours.  Also, if you miss three or more appointments without notifying the office, you may be dismissed from the clinic at the provider's discretion.      For prescription refill requests, have your pharmacy contact our office and allow 72 hours for refills to be completed.    Today you received the following chemotherapy and/or immunotherapy agents: Pembrolizumab  ,Paclitaxel  and Carboplatin    To help prevent nausea and vomiting after your treatment, we encourage you to take your nausea medication as directed.  BELOW ARE SYMPTOMS THAT SHOULD BE REPORTED IMMEDIATELY: *FEVER GREATER THAN 100.4 F (38 C) OR HIGHER *CHILLS OR SWEATING *NAUSEA AND VOMITING THAT IS NOT CONTROLLED WITH YOUR NAUSEA MEDICATION *UNUSUAL SHORTNESS OF BREATH *UNUSUAL BRUISING OR BLEEDING *URINARY PROBLEMS (pain or burning when urinating, or frequent urination) *BOWEL PROBLEMS (unusual diarrhea, constipation, pain near the anus) TENDERNESS IN MOUTH AND THROAT WITH OR WITHOUT PRESENCE OF ULCERS (sore throat, sores in mouth, or a toothache) UNUSUAL RASH, SWELLING OR PAIN  UNUSUAL VAGINAL DISCHARGE OR ITCHING   Items with * indicate a potential emergency and should be followed up as soon as possible or go to the Emergency Department if any problems should occur.  Please show the CHEMOTHERAPY  ALERT CARD or IMMUNOTHERAPY ALERT CARD at check-in to the Emergency Department and triage nurse.  Should you have questions after your visit or need to cancel or reschedule your appointment, please contact [facility] - A DEPT OF [facility]  Dept: [phone]  and follow the prompts.  Office hours are 8:00 a.m. to 4:30 p.m. Monday - Friday. Please note that voicemails left after 4:00 p.m. may not be returned until the following business day.  We are closed weekends and major holidays. You have access to a nurse at all times for urgent questions. Please call the main number to the clinic Dept: [phone] and follow the prompts.   For any non-urgent questions, you may also contact your provider using MyChart. We now offer e-Visits for anyone 43 and older to request care online for non-urgent symptoms. For details visit [email or web address].   Also download the MyChart app! Go to the app store, search MyChart, open the app, select [place], and log in with your MyChart username and password.

## 2024-07-01 ENCOUNTER — Other Ambulatory Visit (HOSPITAL_COMMUNITY): Payer: Self-pay

## 2024-07-01 ENCOUNTER — Telehealth: Payer: Self-pay

## 2024-07-01 ENCOUNTER — Telehealth: Payer: Self-pay | Admitting: Oncology

## 2024-07-01 LAB — GLUCOSE, CAPILLARY: Glucose-Capillary: 197 mg/dL — ABNORMAL HIGH (ref 70–99)

## 2024-07-01 NOTE — Telephone Encounter (Addendum)
 Called Brenda [name] to see how she is feeling after chemo yesterday.  She reports doing really well. Her vision is back to normal. The only issue she is having is some joint soreness in her hips which she usually experiences after chemo.  She also asked about the refill of compazine .  Advised her that it has been sent and is at the [facility]. Let her know to call the office if she has any vision changes or symptoms. She verbalized understanding and agreement.

## 2024-07-01 NOTE — Telephone Encounter (Signed)
 Patient's husband called to verify the doctor's order regarding his wife's pain medication. He reported an increase in her pain and asked whether she could take two 10 mg tablets of oxycodone  instead of the prescribed one. Advised that per the current note, the order is for one 10 mg oxycodone  tablet every 6 hours as needed, along with Tylenol  for pain management

## 2024-07-02 ENCOUNTER — Telehealth: Payer: Self-pay

## 2024-07-02 NOTE — Telephone Encounter (Signed)
 Called regarding call yesterday regarding pain medication. Spoke with Cathern and husband. Per Dr. Lonn, when I saw the patient on Monday, I verified with her that her pain was under control with 10 mg oxycodone  and hence I refilled at that dose.  Her family member was present and nobody objected to the dose.  Due to her frequent hx of confusion I do not recommend increasing the dose of her oxycodone  as prescribed.  They both verbalized understanding and will take oxycodone  as prescribed. They will call the office back for questions/ concerns.

## 2024-07-07 ENCOUNTER — Encounter: Payer: Self-pay | Admitting: Neurology

## 2024-07-07 ENCOUNTER — Ambulatory Visit: Admitting: Neurology

## 2024-07-07 VITALS — BP 127/59 | HR 84 | Ht 62.0 in | Wt 177.5 lb

## 2024-07-07 DIAGNOSIS — I63431 Cerebral infarction due to embolism of right posterior cerebral artery: Secondary | ICD-10-CM | POA: Diagnosis not present

## 2024-07-07 DIAGNOSIS — I6310 Cerebral infarction due to embolism of unspecified precerebral artery: Secondary | ICD-10-CM

## 2024-07-07 NOTE — Progress Notes (Signed)
 [facility]  PATIENT: Brenda [name] DOB: 10/01/1960  REQUESTING CLINICIAN: Jerri Pfeiffer, MD HISTORY FROM: Patient and Husband  REASON FOR VISIT: Stroke follow up    HISTORICAL  CHIEF COMPLAINT:  Chief Complaint  Patient presents with   RM13/STROKE    Pt is here with her Husband. Pt states she has had some ups and downs.      HISTORY OF PRESENT ILLNESS:  Discussed the use of AI scribe software for clinical note transcription with the patient, who gave verbal consent to proceed.  Brenda [name] is a 63 year old female with a history of metastatic endometrial cancer, hypertension, hyperlipidemia, diabetes, chronic pain, depression who presents with vision problems and word-finding difficulties following a stroke in late September.  She experienced strokes in September, leading to significant vision problems and word-finding difficulties.  Her stroke etiology was deemed to be in the setting of hypercoagulability of malignancy.  On discharge, she was put on Eliquis.  Her vision issues are severe, preventing her from seeing well enough to make phone calls, and her word-finding difficulties are worsening. She has telehealth appointments with a child psychotherapist at the cancer center every two weeks.  She is supposed to get home therapy but has not started.  She reports compliance with her cancer treatment  She experiences dizziness and nystagmus, particularly when moving quickly or scrolling through text on her phone. The sensation is similar to dizziness felt after spinning around as a child, accompanied by her eyes rolling back and a feeling akin to passing out.  She has been experiencing neuropathy in her feet, which has worsened following her second chemotherapy treatment. She is under the care of Dr. Lonn at [facility] for her cancer treatment.  She has a history of social anxiety, particularly when contacting people in authority, complicating her  ability to follow up on therapy appointments. She also mentions difficulty with cognition, such as forgetting how to use her CPAP machine, though she recently regained this ability.     Brief/Interim Summary: Brenda [name] is a 63 y.o. 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 Reports sudden onset of symptoms ~ 2130 which gradually improved and now feels that speech mostly back to normal. Denies any other new symptoms this evening. However, recently over the past few weeks has noted R sided headaches, associated episodes of nausea and vomiting, and seeing colors in her R visual field. No other seizure like activity noted. She has also had weakness mostly in the left leg, especially in the hip flexor over the past month which she feels is unchanged. She has had multiple falls related to weakness in this leg, but none in the past week or so. Had not noted any acute visual change tonight or change in weakness, numbness. She also notes she had a recent retinal procedure on the left eye, for what sounds like diabetic retinopathy and noted that L pupil has been more dilated since this time. Otherwise notes that she had recent UTI and completed a course of antibiotics as outpatient, no recurrent urinary symptoms.    On admission, patient was found to  have acute/subacute infarct.  Stroke team was consulted.  It was thought that her acute embolic infarcts were in setting of hypercoagulability from advanced malignancy.  Patient was recommended Eliquis.  Patient worked with PT, OT and recommended for CIR placement.  Patient  will need outpatient follow-up at stroke clinic in 4 weeks.   OTHER MEDICAL CONDITIONS: Metastatic endometrial carcinoma, heart failure, hypertension, hyperlipidemia, diabetes, depression, chronic pain   REVIEW OF SYSTEMS: Full 14 system review of systems performed and negative with exception of: As noted in the HPI  ALLERGIES: Allergies  Allergen Reactions   Hydrocodone Shortness Of Breath, Dermatitis and Other (See Comments)    I forget to breathe.   Clopidogrel Rash and Dermatitis    HOME MEDICATIONS: Outpatient Medications Prior to Visit  Medication Sig Dispense Refill   acetaminophen  (TYLENOL ) 500 MG tablet Take 1-2 tablets (500-1,000 mg total) by mouth every 8 (eight) hours as needed for mild pain (pain score 1-3) or moderate pain (pain score 4-6).     atorvastatin  (LIPITOR ) 80 MG tablet Take 1 tablet (80 mg total) by mouth every evening. 30 tablet 0   Cholecalciferol  (VITAMIN D3 SUPER STRENGTH) 50 MCG (2000 UT) CAPS Take 2,000 Units by mouth in the morning.     enoxaparin  (LOVENOX ) 80 MG/0.8ML injection Inject 0.8 mLs (80 mg total) into the skin every 12 (twelve) hours. 48 mL 0   FLUoxetine  (PROZAC ) 40 MG capsule Take 1 capsule (40 mg total) by mouth daily. 30 capsule 0   guaiFENesin  (MUCINEX ) 600 MG 12 hr tablet Take 1 tablet (600 mg total) by mouth 2 (two) times daily as needed for cough or to loosen phlegm. 30 tablet 0   haloperidol (HALDOL) 1 MG tablet Take 2 tablets (2 mg total) by mouth 2 (two) times daily. 120 tablet 0   hydrOXYzine (ATARAX) 10 MG tablet Take 1 tablet (10 mg total) by mouth 3 (three) times daily as needed for anxiety. 60 tablet 0   insulin  degludec (TRESIBA) 100 UNIT/ML FlexTouch Pen Inject 25 Units into the skin daily.     lactulose  (CHRONULAC ) 10 GM/15ML solution Take 45 mLs (30 g total) by mouth 2 (two) times daily as needed for mild constipation or moderate constipation. 236 mL 0   lidocaine  (LIDODERM ) 5 % Place 1 patch onto the skin daily.      metFORMIN (GLUCOPHAGE-XR) 500 MG 24 hr tablet Take 2 tablets (1,000 mg total) by mouth 2 (two) times daily with a meal. 60 tablet 0   mirtazapine (REMERON) 15 MG tablet Take 1 tablet (15 mg total) by mouth at bedtime. 30 tablet 0   Misc Natural Products (NEURIVA PO) Take 1 tablet by mouth daily.     Misc. Devices MISC Portable oxygen  concentrator, 2L oxygen .  Diagnoses-chronic respiratory failure with hypoxia 1 each 0   nystatin cream (MYCOSTATIN) Apply topically 2 (two) times daily. (Patient taking differently: Apply topically 2 (two) times daily as needed for dry skin (yeast).) 30 g 0   Oxycodone  HCl 10 MG TABS Take 1 tablet (10 mg total) by mouth every 6 (six) hours as needed (pain). 90 tablet 0   polyethylene glycol powder (GLYCOLAX /MIRALAX ) 17 GM/SCOOP powder Dissolve 1 capful (17g) in 4-8 ounces of liquid and take by mouth daily as needed for mild constipation. 238 g 0   pregabalin  (LYRICA ) 50 MG capsule Take 1 capsule (50 mg total) by mouth 3 (three) times daily. 90 capsule 0   prochlorperazine  (COMPAZINE ) 10 MG tablet Take 1 tablet (10 mg  total) by mouth every 6 (six) hours as needed for nausea or vomiting. 30 tablet 1   senna-docusate (SENOKOT-S) 8.6-50 MG tablet Take 1-2 tablets by mouth 2 (two) times daily between meals as needed for mild constipation or moderate constipation. (Patient taking differently: Take 1-2 tablets by mouth as needed for mild constipation or moderate constipation.)     insulin  regular (NOVOLIN  R) 100 units/mL injection Inject 5 Units into the skin 3 (three) times daily before meals. (Patient not taking: Reported on 07/07/2024)     nitroGLYCERIN  (NITROSTAT ) 0.4 MG SL tablet Dissolve 1 tablet under the tongue every 5 minutes as needed for chest pain. Max of 3 doses, then 911. (Patient not taking: Reported on 07/07/2024) 25 tablet 6   No facility-administered medications prior to visit.    PAST MEDICAL HISTORY: Past Medical History:  Diagnosis Date   Anemia     Arthritis    back, hands, hips (02/22/2015)   CAD (coronary artery disease)    a. complex LAD/diagonal bifurcation PCI in 2010. b. STEMI 10/2019 s/p PTCA/DES x1 to mLAD overlapping the old stent, residual disease treated medicaly.   Cancer [facility])    uterine   CHF (congestive heart failure) (HCC)    Depression    Diabetes mellitus (HCC)    started when I was pregnant; not sure if it was type 1 or type 2    History of radiation therapy    Endometrium- HDR 01/22/22-03/07/22- Dr. Lynwood Nasuti   Hypercholesterolemia    Hypertension    Morbid obesity (HCC)    Myocardial infarction (HCC)    mild x 3   Neuromuscular disorder (HCC)    neuropathy feet   Sleep apnea    cpap/    PAST SURGICAL HISTORY: Past Surgical History:  Procedure Laterality Date   CARDIAC CATHETERIZATION  12/02/2012   CHOLECYSTECTOMY OPEN  09/04/1987   CORONARY ANGIOPLASTY WITH STENT PLACEMENT  09/03/2009   CORONARY/GRAFT ACUTE MI REVASCULARIZATION N/A 10/26/2019   Procedure: CORONARY/GRAFT ACUTE MI REVASCULARIZATION;  Surgeon: Verlin Lonni BIRCH, MD;  Location: [facility] INVASIVE CV LAB;  Service: Cardiovascular;  Laterality: N/A;   INCISIONAL HERNIA REPAIR N/A 12/09/2021   Procedure: LAPAROSCOPIC REPAIR UMBILICAL HERNIA WITH MESH;  Surgeon: Vernetta Berg, MD;  Location: [facility] ORS;  Service: General;  Laterality: N/A;   IR IMAGING GUIDED PORT INSERTION  04/08/2024   LEFT HEART CATH AND CORONARY ANGIOGRAPHY N/A 10/26/2019   Procedure: LEFT HEART CATH AND CORONARY ANGIOGRAPHY;  Surgeon: Verlin Lonni BIRCH, MD;  Location: [facility] INVASIVE CV LAB;  Service: Cardiovascular;  Laterality: N/A;   LEFT HEART CATHETERIZATION WITH CORONARY ANGIOGRAM N/A 12/25/2012   Procedure: LEFT HEART CATHETERIZATION WITH CORONARY ANGIOGRAM;  Surgeon: Ozell Fell, MD;  Location: [facility] CATH LAB;  Service: Cardiovascular;  Laterality: N/A;   RADIOLOGY WITH ANESTHESIA N/A 04/21/2024   Procedure: MRI WITH ANESTHESIA;  Surgeon: Radiologist,  Medication, MD;  Location: [facility] OR;  Service: Radiology;  Laterality: N/A;  BRAIN W/ W/O   ROBOTIC ASSISTED TOTAL HYSTERECTOMY WITH BILATERAL SALPINGO OOPHERECTOMY N/A 12/06/2021   Procedure: XI ROBOTIC ASSISTED TOTAL HYSTERECTOMY WITH BILATERAL SALPINGO OOPHORECTOMY;  Surgeon: [name] SAUNDERS, MD;  Location: [facility] ORS;  Service: Gynecology;  Laterality: N/A;   UMBILICAL HERNIA REPAIR  05/04/1989   doesn't think they used mesh    FAMILY HISTORY: Family History  Problem Relation Age of Onset   Coronary artery disease Mother        in her mid 58s   Hypertension Mother    Cancer Mother  skin   Heart attack Father        in his mid 2s   COPD Father    Stroke Sister    Coronary artery disease Sister    Diabetes Sister    Other Half-Sister    Thyroid  cancer Daughter    Prostate cancer Neg Hx    Colon cancer Neg Hx    Breast cancer Neg Hx    Endometrial cancer Neg Hx    Ovarian cancer Neg Hx    Pancreatic cancer Neg Hx     SOCIAL HISTORY: Social History   Socioeconomic History   Marital status: Married    Spouse name: Not on file   Number of children: 2   Years of education: Not on file   Highest education level: Not on file  Occupational History   Occupation: Housewife  Tobacco Use   Smoking status: Former    Current packs/day: 0.00    Average packs/day: 0.5 packs/day for 1 year (0.5 ttl pk-yrs)    Types: Cigarettes, Cigars    Start date: 09/03/1985    Quit date: 09/03/1986    Years since quitting: 37.8   Smokeless tobacco: Never  Vaping Use   Vaping status: Never Used  Substance and Sexual Activity   Alcohol use: No   Drug use: No   Sexual activity: Not Currently  Other Topics Concern   Not on file  Social History Narrative   Not on file   Social Drivers of Health   Financial Resource Strain: High Risk (02/10/2021)   Overall Financial Resource Strain (CARDIA)    Difficulty of Paying Living Expenses: Hard  Food Insecurity: No Food Insecurity (06/19/2024)    Hunger Vital Sign    Worried About Running Out of Food in the Last Year: Never true    Ran Out of Food in the Last Year: Never true  Recent Concern: Food Insecurity - Food Insecurity Present (04/20/2024)   Hunger Vital Sign    Worried About Running Out of Food in the Last Year: Sometimes true    Ran Out of Food in the Last Year: Never true  Transportation Needs: No Transportation Needs (06/19/2024)   PRAPARE - Administrator, Civil Service (Medical): No    Lack of Transportation (Non-Medical): No  Physical Activity: Not on file  Stress: Not on file  Social Connections: Unknown (05/04/2024)   Social Connection and Isolation Panel    Frequency of Communication with Friends and Family: Once a week    Frequency of Social Gatherings with Friends and Family: Never    Attends Religious Services: More than 4 times per year    Active Member of [employer] or Organizations: No    Attends Banker Meetings: 1 to 4 times per year    Marital Status: Patient declined  Intimate Partner Violence: Not At Risk (06/19/2024)   Humiliation, Afraid, Rape, and Kick questionnaire    Fear of Current or Ex-Partner: No    Emotionally Abused: No    Physically Abused: No    Sexually Abused: No    PHYSICAL EXAM  GENERAL EXAM/CONSTITUTIONAL: Vitals:  Vitals:   07/07/24 1549  BP: (!) 127/59  Pulse: 84  SpO2: 99%  Weight: 177 lb 8 oz (80.5 kg)  Height: 5' 2 (1.575 m)   Body mass index is 32.47 kg/m. Wt Readings from Last 3 Encounters:  07/07/24 177 lb 8 oz (80.5 kg)  06/29/24 174 lb 3.2 oz (79 kg)  06/19/24 178 lb 9.2  oz (81 kg)   Patient is in no distress; well developed, nourished and groomed; neck is supple  MUSCULOSKELETAL: Gait, strength, tone, movements noted in Neurologic exam below  NEUROLOGIC: MENTAL STATUS:      No data to display         awake, alert, oriented to person, place and time recent and remote memory intact normal attention and concentration language  fluent, comprehension intact, naming intact fund of knowledge appropriate  CRANIAL NERVE:  2nd, 3rd, 4th, 6th-there is a left upper quadrant visual field cut, extraocular muscles intact, no nystagmus 5th - facial sensation symmetric 7th - facial strength symmetric 8th - hearing intact 9th - palate elevates symmetrically, uvula midline 11th - shoulder shrug symmetric 12th - tongue protrusion midline  MOTOR:  normal bulk and tone, at least antigravity  SENSORY:  normal and symmetric to light touch  COORDINATION:  finger-nose-finger, fine finger movements normal  GAIT/STATION:  Deferred, using a wheelchair   DIAGNOSTIC DATA (LABS, IMAGING, TESTING) - I reviewed patient records, labs, notes, testing and imaging myself where available.  Lab Results  Component Value Date   WBC 9.9 06/29/2024   HGB 9.7 (L) 06/29/2024   HCT 30.4 (L) 06/29/2024   MCV 83.5 06/29/2024   PLT 327 06/29/2024      Component Value Date/Time   NA 137 06/29/2024 1152   NA 138 01/25/2023 0945   K 3.9 06/29/2024 1152   CL 101 06/29/2024 1152   CO2 28 06/29/2024 1152   GLUCOSE 181 (H) 06/29/2024 1152   BUN 11 06/29/2024 1152   BUN 13 01/25/2023 0945   CREATININE 0.64 06/29/2024 1152   CREATININE 0.83 03/29/2016 1030   CALCIUM  9.5 06/29/2024 1152   PROT 7.3 06/29/2024 1152   PROT 6.6 01/25/2023 0945   ALBUMIN 3.4 (L) 06/29/2024 1152   ALBUMIN 4.0 01/25/2023 0945   AST 45 (H) 06/29/2024 1152   ALT 29 06/29/2024 1152   ALKPHOS 83 06/29/2024 1152   BILITOT 0.3 06/29/2024 1152   GFRNONAA >60 06/29/2024 1152   GFRNONAA 81 03/29/2016 1030   GFRAA 94 12/16/2019 1041   GFRAA >89 03/29/2016 1030   Lab Results  Component Value Date   CHOL 100 05/30/2024   HDL 38 (L) 05/30/2024   LDLCALC 28 05/30/2024   LDLDIRECT 135.9 02/22/2011   TRIG 170 (H) 05/30/2024   CHOLHDL 2.6 05/30/2024   Lab Results  Component Value Date   HGBA1C 7.2 (H) 06/01/2024   Lab Results  Component Value Date    VITAMINB12 764 04/21/2024   Lab Results  Component Value Date   TSH 3.990 04/27/2024    MRI Brain 05/31/2024 1. Numerous small acute embolic infarcts in the bilateral anterior and posterior circulation, with superimposed confluent Right PCA territory infarct corresponding to that seen on CT this morning. 2. Cytotoxic edema with no intracranial hemorrhage or mass effect. 3. No intracranial metastatic disease identified.  CTA neck: 1. The common carotid and internal carotid arteries are patent within the neck without stenosis. Mild atherosclerotic plaque bilaterally, as described. 2. The vertebral arteries are patent within the neck without stenosis or significant atherosclerotic disease. 3. Aortic Atherosclerosis (ICD10-I70.0). 4. Only faint residual ground-glass opacity remains at site of the 12 mm right upper lobe pulmonary nodule described on the recent prior chest CT of 04/03/2024. 5. A 6 mm part-solid nodule more medially within the right upper lobe is unchanged.   CTA head: 1. No proximal intracranial large vessel occlusion or high-grade proximal arterial stenosis is  identified. 2. Non-stenotic atherosclerotic plaque within the intracranial ICAs    ASSESSMENT AND PLAN  63 y.o. year old female with    Multiple cerebral infarctions with residual deficits including left homonymous superior quadrantanopia, visual acuity impairment, aphasia, cognitive impairment which is improving, and right-sided hemiparesis. Stroke etiology likely hyper coagulopathy of malignancy.  Patient is currently on blood thinners. - Continue occupational therapy, physical therapy, and speech therapy as needed. - Monitor for signs of GI bleeding due to anticoagulation therapy. - Encouraged follow-up with eye doctor at the end of the month. - Advised on slow movements to prevent dizziness. - Encouraged use of MyChart for communication with healthcare providers.  Chemotherapy-induced peripheral  neuropathy Peripheral neuropathy in the feet, likely induced by chemotherapy. Symptoms have been increasing with subsequent treatments. - Continue to monitor neuropathy symptoms and report any worsening.  Anticoagulation therapy for hypercoagulability secondary to malignancy Anticoagulation therapy initiated to address hypercoagulability secondary to malignancy, reducing the risk of future strokes. Discussed potential side effects including bruising, epistaxis, and GI bleeding. Emphasized the importance of monitoring for signs of bleeding, such as black stools. - Continue anticoagulation therapy. - Monitor for signs of bleeding, including black stools and unusual bruising.     1. Cerebrovascular accident (CVA) due to embolism of right posterior cerebral artery (HCC)   2. Cerebrovascular accident (CVA) due to embolism of precerebral artery [facility])      Patient Instructions  Continue Eliquis, monitor for evidence of GI bleed Continue your other medications Follow-up with therapies, including physical, occupational, and speech Continue follow-up with your doctors Return as needed  No orders of the defined types were placed in this encounter.   No orders of the defined types were placed in this encounter.   Return if symptoms worsen or fail to improve.  I personally spent a total of 50 minutes in the care of the patient today including preparing to see the patient, getting/reviewing separately obtained history, performing a medically appropriate exam/evaluation, counseling and educating, documenting clinical information in the EHR, independently interpreting results, and communicating results.    Pastor Falling, MD 07/07/2024, 6:08 PM  [facility] [address] [place], [place] [address] [phone]

## 2024-07-07 NOTE — Patient Instructions (Signed)
 Continue Eliquis, monitor for evidence of GI bleed Continue your other medications Follow-up with therapies, including physical, occupational, and speech Continue follow-up with your doctors Return as needed

## 2024-07-09 ENCOUNTER — Encounter: Payer: Self-pay | Admitting: Neurology

## 2024-07-09 ENCOUNTER — Inpatient Hospital Stay: Attending: Hematology and Oncology | Admitting: Licensed Clinical Social Worker

## 2024-07-09 ENCOUNTER — Encounter: Payer: Self-pay | Admitting: Hematology and Oncology

## 2024-07-09 NOTE — Progress Notes (Signed)
 [facility] CSW Counseling Note  Patient was referred by self. Treatment type: Individual   Presenting Concerns: Patient and/or family reports the following symptoms/concerns: difficulty adjusting to changes in health status and cancer recurrence Duration of problem: 1 months; Severity of problem: mild   Orientation:oriented to person, place, time/date, and situation.   Affect: Appropriate and Congruent Risk of harm to self or others: No plan to harm self or others  Patient and/or Family's Strengths/Protective Factors: Social connections and Social and Emotional competenceAbility for insight  Religious Affiliation  Supportive family/friends   Patient connected with social worker through [employer] to assist with navigating resources;  Has medication for mood (Prozac  & wellbutrin )   Goals Addressed: Patient will:  Increase knowledge and/or ability of: coping skills related to dealing with major health concerns Increase healthy adjustment to current life circumstances with focus on accepting situations and being able to move forward with day to day living with quality of life Improve ability to grieve over changes and loss and allow others space to grieve   Progress towards Goals: Progressing   Interventions: Interventions utilized:  Solution-Focused Strategies, CBT Cognitive Behavioral Therapy, and Link to [facility]  No updated screeners today        06/30/2024   11:36 AM 05/07/2024   10:34 AM 05/01/2024    7:59 AM  Depression screen PHQ 2/9  Decreased Interest 0 0 0  Down, Depressed, Hopeless 0 1 0  PHQ - 2 Score 0 1 0  Altered sleeping  2   Tired, decreased energy  2   Change in appetite  2   Feeling bad or failure about yourself   1   Trouble concentrating  1   Moving slowly or fidgety/restless  0   Suicidal thoughts  0   PHQ-9 Score  9    Difficult doing work/chores  Somewhat difficult      Data saved with a previous flowsheet row definition       05/07/2024    10:34 AM 12/25/2021    2:49 PM 11/27/2021   11:35 AM 11/01/2021    3:16 PM  GAD 7 : Generalized Anxiety Score  Nervous, Anxious, on Edge 1 1 2 1   Control/stop worrying 1 2 2 1   Worry too much - different things 0 1 2 1   Trouble relaxing 0 1 2 0  Restless 0 1 2 0  Easily annoyed or irritable 1 1 2  0  Afraid - awful might happen 3 2 2 1   Total GAD 7 Score 6 9 14 4   Anxiety Difficulty Somewhat difficult       Assessment: Patient is adjusting to being at home and grieving changes in vision which is having the biggest impact on her day-to-day life.  This prevents her from doing things she enjoys like reading, watching tv, and driving places.  CSW provided support around grief and loss and began brainstorming alternative ways to access activities (ex: audio books). Pt overall feels she is coping well. She does have some days she thinks it would be easier to not be here, but has no intention to harm herself. She does ensure there are no medications to tempt her and remembers her family. Discussed these moments as a wave and what pt can utilize to get through it  Continued discussion on goals which is to remain at home as long as possible.  CSW placed referral with Dr. Buzz consent for [facility] services to determine if in-home aide will be available for pt.  Also signed up for [facility].     Plan: Follow up with CSW: 2 weeks Behavioral recommendations:   Explore alternatives for enjoyable activities When dealing with a depression/anxiety wave, Use coping skills/tools: Picture elephants taking care of you Grounding with senses- notice 5 things you see, 4 things you can feel, 3 that you hear, 2 that you smell, and 1 that you taste Self-hug / rub arms. Remind yourself of where you are and that you are safe. Talk to/call someone you trust  Referral(s): [facility] referral; [facility] [employer]; info on Aging Gracefully program       Ellenie Salome E Maryjo Ragon, LCSW   Patient is participating in a Managed Medicaid  Plan:  Yes

## 2024-07-10 ENCOUNTER — Other Ambulatory Visit (HOSPITAL_COMMUNITY): Payer: Self-pay

## 2024-07-11 ENCOUNTER — Other Ambulatory Visit (HOSPITAL_COMMUNITY): Payer: Self-pay

## 2024-07-12 ENCOUNTER — Observation Stay (HOSPITAL_COMMUNITY)
Admission: EM | Admit: 2024-07-12 | Discharge: 2024-07-14 | Disposition: A | Discharge status: Home / Self Care | Attending: Internal Medicine | Admitting: Internal Medicine

## 2024-07-12 ENCOUNTER — Encounter (HOSPITAL_COMMUNITY): Payer: Self-pay | Admitting: *Deleted

## 2024-07-12 ENCOUNTER — Emergency Department (HOSPITAL_COMMUNITY)

## 2024-07-12 ENCOUNTER — Other Ambulatory Visit: Payer: Self-pay

## 2024-07-12 DIAGNOSIS — R300 Dysuria: Secondary | ICD-10-CM | POA: Diagnosis present

## 2024-07-12 DIAGNOSIS — Z8673 Personal history of transient ischemic attack (TIA), and cerebral infarction without residual deficits: Secondary | ICD-10-CM | POA: Diagnosis not present

## 2024-07-12 DIAGNOSIS — G9340 Encephalopathy, unspecified: Secondary | ICD-10-CM | POA: Insufficient documentation

## 2024-07-12 DIAGNOSIS — F418 Other specified anxiety disorders: Secondary | ICD-10-CM | POA: Diagnosis present

## 2024-07-12 DIAGNOSIS — E119 Type 2 diabetes mellitus without complications: Secondary | ICD-10-CM | POA: Diagnosis not present

## 2024-07-12 DIAGNOSIS — F419 Anxiety disorder, unspecified: Secondary | ICD-10-CM | POA: Insufficient documentation

## 2024-07-12 DIAGNOSIS — Z79899 Other long term (current) drug therapy: Secondary | ICD-10-CM | POA: Diagnosis not present

## 2024-07-12 DIAGNOSIS — Z87891 Personal history of nicotine dependence: Secondary | ICD-10-CM | POA: Diagnosis not present

## 2024-07-12 DIAGNOSIS — Z66 Do not resuscitate: Secondary | ICD-10-CM | POA: Diagnosis not present

## 2024-07-12 DIAGNOSIS — J9611 Chronic respiratory failure with hypoxia: Secondary | ICD-10-CM | POA: Diagnosis not present

## 2024-07-12 DIAGNOSIS — G9341 Metabolic encephalopathy: Secondary | ICD-10-CM | POA: Insufficient documentation

## 2024-07-12 DIAGNOSIS — G4733 Obstructive sleep apnea (adult) (pediatric): Secondary | ICD-10-CM | POA: Diagnosis not present

## 2024-07-12 DIAGNOSIS — G893 Neoplasm related pain (acute) (chronic): Secondary | ICD-10-CM | POA: Diagnosis not present

## 2024-07-12 DIAGNOSIS — Z8679 Personal history of other diseases of the circulatory system: Secondary | ICD-10-CM

## 2024-07-12 DIAGNOSIS — I5032 Chronic diastolic (congestive) heart failure: Secondary | ICD-10-CM | POA: Insufficient documentation

## 2024-07-12 DIAGNOSIS — C541 Malignant neoplasm of endometrium: Secondary | ICD-10-CM | POA: Diagnosis not present

## 2024-07-12 DIAGNOSIS — E118 Type 2 diabetes mellitus with unspecified complications: Secondary | ICD-10-CM

## 2024-07-12 DIAGNOSIS — N3000 Acute cystitis without hematuria: Secondary | ICD-10-CM | POA: Diagnosis not present

## 2024-07-12 DIAGNOSIS — R0789 Other chest pain: Secondary | ICD-10-CM | POA: Diagnosis present

## 2024-07-12 DIAGNOSIS — Z6832 Body mass index (BMI) 32.0-32.9, adult: Secondary | ICD-10-CM | POA: Diagnosis not present

## 2024-07-12 DIAGNOSIS — I2510 Atherosclerotic heart disease of native coronary artery without angina pectoris: Secondary | ICD-10-CM | POA: Diagnosis not present

## 2024-07-12 DIAGNOSIS — F32A Depression, unspecified: Secondary | ICD-10-CM | POA: Insufficient documentation

## 2024-07-12 DIAGNOSIS — E66811 Obesity, class 1: Secondary | ICD-10-CM | POA: Insufficient documentation

## 2024-07-12 DIAGNOSIS — Z86718 Personal history of other venous thrombosis and embolism: Secondary | ICD-10-CM | POA: Diagnosis not present

## 2024-07-12 DIAGNOSIS — I110 Hypertensive heart disease with heart failure: Secondary | ICD-10-CM | POA: Diagnosis not present

## 2024-07-12 DIAGNOSIS — Z794 Long term (current) use of insulin: Secondary | ICD-10-CM | POA: Diagnosis not present

## 2024-07-12 DIAGNOSIS — N390 Urinary tract infection, site not specified: Secondary | ICD-10-CM | POA: Diagnosis present

## 2024-07-12 LAB — I-STAT CG4 LACTIC ACID, ED: Lactic Acid, Venous: 0.6 mmol/L (ref 0.5–1.9)

## 2024-07-12 LAB — URINALYSIS, W/ REFLEX TO CULTURE (INFECTION SUSPECTED)
Bilirubin Urine: NEGATIVE
Glucose, UA: NEGATIVE mg/dL
Hgb urine dipstick: NEGATIVE
Ketones, ur: NEGATIVE mg/dL
Nitrite: NEGATIVE
Protein, ur: NEGATIVE mg/dL
Specific Gravity, Urine: 1.008 (ref 1.005–1.030)
pH: 5 (ref 5.0–8.0)

## 2024-07-12 LAB — CBC WITH DIFFERENTIAL/PLATELET
Abs Immature Granulocytes: 0.02 K/uL (ref 0.00–0.07)
Basophils Absolute: 0 K/uL (ref 0.0–0.1)
Basophils Relative: 1 %
Eosinophils Absolute: 0 K/uL (ref 0.0–0.5)
Eosinophils Relative: 1 %
HCT: 30.1 % — ABNORMAL LOW (ref 36.0–46.0)
Hemoglobin: 9.3 g/dL — ABNORMAL LOW (ref 12.0–15.0)
Immature Granulocytes: 1 %
Lymphocytes Relative: 31 %
Lymphs Abs: 1.3 K/uL (ref 0.7–4.0)
MCH: 26.6 pg (ref 26.0–34.0)
MCHC: 30.9 g/dL (ref 30.0–36.0)
MCV: 86 fL (ref 80.0–100.0)
Monocytes Absolute: 0.3 K/uL (ref 0.1–1.0)
Monocytes Relative: 9 %
Neutro Abs: 2.3 K/uL (ref 1.7–7.7)
Neutrophils Relative %: 57 %
Platelets: 258 K/uL (ref 150–400)
RBC: 3.5 MIL/uL — ABNORMAL LOW (ref 3.87–5.11)
RDW: 15.7 % — ABNORMAL HIGH (ref 11.5–15.5)
WBC: 4 K/uL (ref 4.0–10.5)
nRBC: 0 % (ref 0.0–0.2)

## 2024-07-12 LAB — COMPREHENSIVE METABOLIC PANEL WITH GFR
ALT: 24 U/L (ref 0–44)
AST: 24 U/L (ref 15–41)
Albumin: 3 g/dL — ABNORMAL LOW (ref 3.5–5.0)
Alkaline Phosphatase: 70 U/L (ref 38–126)
Anion gap: 12 (ref 5–15)
BUN: 9 mg/dL (ref 8–23)
CO2: 22 mmol/L (ref 22–32)
Calcium: 9.1 mg/dL (ref 8.9–10.3)
Chloride: 100 mmol/L (ref 98–111)
Creatinine, Ser: 0.54 mg/dL (ref 0.44–1.00)
GFR, Estimated: >60 mL/min (ref >60)
Glucose, Bld: 183 mg/dL — ABNORMAL HIGH (ref 70–99)
Potassium: 4.3 mmol/L (ref 3.5–5.1)
Sodium: 134 mmol/L — ABNORMAL LOW (ref 135–145)
Total Bilirubin: 0.2 mg/dL (ref 0.0–1.2)
Total Protein: 6.8 g/dL (ref 6.5–8.1)

## 2024-07-12 LAB — PROTIME-INR
INR: 1 (ref 0.8–1.2)
Prothrombin Time: 13.6 s (ref 11.4–15.2)

## 2024-07-12 NOTE — ED Notes (Addendum)
 Information from husband: Last Fiasp  shot was done at 21:27 1 unit, per husband she has been responding to insulin  more than she has in the past, same amount of insulin  has been making blood sugars drop more than usual   Usually a lot more with it since the stroke than she is now   Recently started an antibiotic nitrofurantoin yesterday prophalactically, has taken 2 doses. Was put on it by her PCP.

## 2024-07-12 NOTE — ED Provider Notes (Signed)
 [place] [facility] AT [facility] Provider Note   CSN: [id number] Arrival date & time: 07/12/24  2243     Patient presents with: Urinary Tract Infection   Ludivina Guymon is a 63 y.o. female.  {Add pertinent medical, surgical, social history, OB history to YEP:[id number]} Patient presents to the emergency department for evaluation of confusion and urinary tract symptoms.  Patient reports a history of recurrent urinary tract infections.  She has been having urinary frequency and urgency with small urine volumes for a couple of days.  She was going to try to get to her doctor tomorrow morning but family started to notice some confusion tonight.  Patient reports some low back pain as well.  No fever or vomiting.       Prior to Admission medications   Medication Sig Start Date End Date Taking? Authorizing Provider  acetaminophen  (TYLENOL ) 500 MG tablet Take 1-2 tablets (500-1,000 mg total) by mouth every 8 (eight) hours as needed for mild pain (pain score 1-3) or moderate pain (pain score 4-6). 03/30/24 03/30/25  Hongalgi, Anand D, MD  atorvastatin  (LIPITOR ) 80 MG tablet Take 1 tablet (80 mg total) by mouth every evening. 06/11/24   Jerilynn Daphne SAILOR, NP  Cholecalciferol  (VITAMIN D3 SUPER STRENGTH) 50 MCG (2000 UT) CAPS Take 2,000 Units by mouth in the morning.    [provider]  enoxaparin  (LOVENOX ) 80 MG/0.8ML injection Inject 0.8 mLs (80 mg total) into the skin every 12 (twelve) hours. 06/22/24 07/22/24  Elgergawy, Brayton RAMAN, MD  FLUoxetine  (PROZAC ) 40 MG capsule Take 1 capsule (40 mg total) by mouth daily. 06/11/24   Jerilynn Daphne SAILOR, NP  guaiFENesin  (MUCINEX ) 600 MG 12 hr tablet Take 1 tablet (600 mg total) by mouth 2 (two) times daily as needed for cough or to loosen phlegm. 05/05/24   Amin, Sumayya, MD  haloperidol (HALDOL) 1 MG tablet Take 2 tablets (2 mg total) by mouth 2 (two) times daily. 06/22/24   Elgergawy, Dawood S, MD  hydrOXYzine (ATARAX) 10 MG  tablet Take 1 tablet (10 mg total) by mouth 3 (three) times daily as needed for anxiety. 06/22/24   Elgergawy, Brayton RAMAN, MD  insulin  degludec (TRESIBA) 100 UNIT/ML FlexTouch Pen Inject 25 Units into the skin daily. 06/29/24   Lonn Hicks, MD  insulin  regular (NOVOLIN  R) 100 units/mL injection Inject 5 Units into the skin 3 (three) times daily before meals. Patient not taking: Reported on 07/07/2024    [provider]  lactulose  (CHRONULAC ) 10 GM/15ML solution Take 45 mLs (30 g total) by mouth 2 (two) times daily as needed for mild constipation or moderate constipation. 05/05/24   Caleen Qualia, MD  lidocaine  (LIDODERM ) 5 % Place 1 patch onto the skin daily. 04/27/24   [provider]  metFORMIN (GLUCOPHAGE-XR) 500 MG 24 hr tablet Take 2 tablets (1,000 mg total) by mouth 2 (two) times daily with a meal. 06/11/24   Jerilynn Daphne SAILOR, NP  mirtazapine (REMERON) 15 MG tablet Take 1 tablet (15 mg total) by mouth at bedtime. 06/22/24   Elgergawy, Brayton RAMAN, MD  Misc Natural Products (NEURIVA PO) Take 1 tablet by mouth daily.    [provider]  Misc. Devices MISC Portable oxygen  concentrator, 2L oxygen .  Diagnoses-chronic respiratory failure with hypoxia 11/27/21   Delbert Clam, MD  nitroGLYCERIN  (NITROSTAT ) 0.4 MG SL tablet Dissolve 1 tablet under the tongue every 5 minutes as needed for chest pain. Max of 3 doses, then 911. Patient not taking: Reported  on 07/07/2024 01/25/23   Lelon Hamilton T, PA-C  nystatin cream (MYCOSTATIN) Apply topically 2 (two) times daily. Patient taking differently: Apply topically 2 (two) times daily as needed for dry skin (yeast). 06/11/24   Jerilynn Daphne SAILOR, NP  Oxycodone  HCl 10 MG TABS Take 1 tablet (10 mg total) by mouth every 6 (six) hours as needed (pain). 06/29/24   Lonn Hicks, MD  polyethylene glycol powder (GLYCOLAX /MIRALAX ) 17 GM/SCOOP powder Dissolve 1 capful (17g) in 4-8 ounces of liquid and take by mouth daily as needed for mild constipation.  06/11/24   Jerilynn Daphne SAILOR, NP  pregabalin  (LYRICA ) 50 MG capsule Take 1 capsule (50 mg total) by mouth 3 (three) times daily. 06/11/24   Jerilynn Daphne SAILOR, NP  prochlorperazine  (COMPAZINE ) 10 MG tablet Take 1 tablet (10 mg total) by mouth every 6 (six) hours as needed for nausea or vomiting. 06/30/24   Lonn Hicks, MD  senna-docusate (SENOKOT-S) 8.6-50 MG tablet Take 1-2 tablets by mouth 2 (two) times daily between meals as needed for mild constipation or moderate constipation. Patient taking differently: Take 1-2 tablets by mouth as needed for mild constipation or moderate constipation. 04/24/24   Gonfa, Taye T, MD    Allergies: Hydrocodone and Clopidogrel    Review of Systems  Updated Vital Signs BP (!) 175/73 (BP Location: Right Arm)   Pulse 84   Temp 98.5 F (36.9 C) (Oral)   Resp (!) 22   Ht 5' 2 (1.575 m)   Wt 80.5 kg   LMP 10/30/2014 (Approximate)   SpO2 100%   BMI 32.46 kg/m   Physical Exam Vitals and nursing note reviewed.  Constitutional:      General: She is not in acute distress.    Appearance: She is well-developed.  HENT:     Head: Normocephalic and atraumatic.     Mouth/Throat:     Mouth: Mucous membranes are moist.  Eyes:     General: Vision grossly intact. Gaze aligned appropriately.     Extraocular Movements: Extraocular movements intact.     Conjunctiva/sclera: Conjunctivae normal.  Cardiovascular:     Rate and Rhythm: Normal rate and regular rhythm.     Pulses: Normal pulses.     Heart sounds: Normal heart sounds, S1 normal and S2 normal. No murmur heard.    No friction rub. No gallop.  Pulmonary:     Effort: Pulmonary effort is normal. No respiratory distress.     Breath sounds: Normal breath sounds.  Abdominal:     General: Bowel sounds are normal.     Palpations: Abdomen is soft.     Tenderness: There is no abdominal tenderness. There is no guarding or rebound.     Hernia: No hernia is present.  Musculoskeletal:        General: No swelling.      Cervical back: Full passive range of motion without pain, normal range of motion and neck supple. No spinous process tenderness or muscular tenderness. Normal range of motion.     Right lower leg: No edema.     Left lower leg: No edema.  Skin:    General: Skin is warm and dry.     Capillary Refill: Capillary refill takes less than 2 seconds.     Findings: No ecchymosis, erythema, rash or wound.  Neurological:     General: No focal deficit present.     Mental Status: She is alert and oriented to person, place, and time.     GCS: GCS eye  subscore is 4. GCS verbal subscore is 5. GCS motor subscore is 6.     Cranial Nerves: Cranial nerves 2-12 are intact.     Sensory: Sensation is intact.     Motor: Motor function is intact.     Coordination: Coordination is intact.  Psychiatric:        Attention and Perception: Attention normal.        Mood and Affect: Mood normal.        Speech: Speech normal.        Behavior: Behavior normal.     (all labs ordered are listed, but only abnormal results are displayed) Labs Reviewed  CULTURE, BLOOD (ROUTINE X 2)  CULTURE, BLOOD (ROUTINE X 2)  COMPREHENSIVE METABOLIC PANEL WITH GFR  CBC WITH DIFFERENTIAL/PLATELET  PROTIME-INR  URINALYSIS, W/ REFLEX TO CULTURE (INFECTION SUSPECTED)  I-STAT CG4 LACTIC ACID, ED    EKG: None  Radiology: No results found.  {Document cardiac monitor, telemetry assessment procedure when appropriate:32947} Procedures   Medications Ordered in the ED - No data to display    {Click here for ABCD2, HEART and other calculators REFRESH Note before signing:1}                              Medical Decision Making Amount and/or Complexity of Data Reviewed Labs: ordered. Radiology: ordered.   ***  {Document critical care time when appropriate  Document review of labs and clinical decision tools ie CHADS2VASC2, etc  Document your independent review of radiology images and any outside records  Document your  discussion with family members, caretakers and with consultants  Document social determinants of health affecting pt's care  Document your decision making why or why not admission, treatments were needed:32947:::1}   Final diagnoses:  None    ED Discharge Orders     None
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
## 2024-07-13 ENCOUNTER — Encounter: Payer: Self-pay | Admitting: Neurology

## 2024-07-13 ENCOUNTER — Encounter (HOSPITAL_COMMUNITY): Payer: Self-pay | Admitting: Family Medicine

## 2024-07-13 DIAGNOSIS — Z8679 Personal history of other diseases of the circulatory system: Secondary | ICD-10-CM

## 2024-07-13 DIAGNOSIS — I10 Essential (primary) hypertension: Secondary | ICD-10-CM | POA: Diagnosis not present

## 2024-07-13 DIAGNOSIS — N3000 Acute cystitis without hematuria: Secondary | ICD-10-CM | POA: Diagnosis not present

## 2024-07-13 DIAGNOSIS — F418 Other specified anxiety disorders: Secondary | ICD-10-CM | POA: Diagnosis not present

## 2024-07-13 DIAGNOSIS — Z66 Do not resuscitate: Secondary | ICD-10-CM | POA: Insufficient documentation

## 2024-07-13 DIAGNOSIS — N390 Urinary tract infection, site not specified: Secondary | ICD-10-CM | POA: Diagnosis present

## 2024-07-13 DIAGNOSIS — I5032 Chronic diastolic (congestive) heart failure: Secondary | ICD-10-CM

## 2024-07-13 DIAGNOSIS — R410 Disorientation, unspecified: Secondary | ICD-10-CM | POA: Diagnosis not present

## 2024-07-13 DIAGNOSIS — Z86718 Personal history of other venous thrombosis and embolism: Secondary | ICD-10-CM

## 2024-07-13 DIAGNOSIS — Z8673 Personal history of transient ischemic attack (TIA), and cerebral infarction without residual deficits: Secondary | ICD-10-CM

## 2024-07-13 DIAGNOSIS — C541 Malignant neoplasm of endometrium: Secondary | ICD-10-CM

## 2024-07-13 DIAGNOSIS — J9611 Chronic respiratory failure with hypoxia: Secondary | ICD-10-CM

## 2024-07-13 DIAGNOSIS — E119 Type 2 diabetes mellitus without complications: Secondary | ICD-10-CM | POA: Diagnosis not present

## 2024-07-13 DIAGNOSIS — E66811 Obesity, class 1: Secondary | ICD-10-CM | POA: Diagnosis not present

## 2024-07-13 DIAGNOSIS — Z7401 Bed confinement status: Secondary | ICD-10-CM | POA: Diagnosis not present

## 2024-07-13 DIAGNOSIS — G4733 Obstructive sleep apnea (adult) (pediatric): Secondary | ICD-10-CM | POA: Diagnosis not present

## 2024-07-13 DIAGNOSIS — R300 Dysuria: Secondary | ICD-10-CM | POA: Diagnosis not present

## 2024-07-13 DIAGNOSIS — E118 Type 2 diabetes mellitus with unspecified complications: Secondary | ICD-10-CM

## 2024-07-13 DIAGNOSIS — Z794 Long term (current) use of insulin: Secondary | ICD-10-CM

## 2024-07-13 LAB — CBC
HCT: 29.3 % — ABNORMAL LOW (ref 36.0–46.0)
Hemoglobin: 8.7 g/dL — ABNORMAL LOW (ref 12.0–15.0)
MCH: 25.8 pg — ABNORMAL LOW (ref 26.0–34.0)
MCHC: 29.7 g/dL — ABNORMAL LOW (ref 30.0–36.0)
MCV: 86.9 fL (ref 80.0–100.0)
Platelets: 258 K/uL (ref 150–400)
RBC: 3.37 MIL/uL — ABNORMAL LOW (ref 3.87–5.11)
RDW: 15.7 % — ABNORMAL HIGH (ref 11.5–15.5)
WBC: 3.7 K/uL — ABNORMAL LOW (ref 4.0–10.5)
nRBC: 0 % (ref 0.0–0.2)

## 2024-07-13 LAB — BASIC METABOLIC PANEL WITH GFR
Anion gap: 12 (ref 5–15)
BUN: 9 mg/dL (ref 8–23)
CO2: 23 mmol/L (ref 22–32)
Calcium: 9.3 mg/dL (ref 8.9–10.3)
Chloride: 103 mmol/L (ref 98–111)
Creatinine, Ser: 0.49 mg/dL (ref 0.44–1.00)
GFR, Estimated: >60 mL/min (ref >60)
Glucose, Bld: 93 mg/dL (ref 70–99)
Potassium: 4.1 mmol/L (ref 3.5–5.1)
Sodium: 138 mmol/L (ref 135–145)

## 2024-07-13 LAB — TROPONIN T, HIGH SENSITIVITY
Troponin T High Sensitivity: 17 ng/L (ref 0–19)
Troponin T High Sensitivity: 16 ng/L (ref 0–19)

## 2024-07-13 LAB — GLUCOSE, CAPILLARY
Glucose-Capillary: 130 mg/dL — ABNORMAL HIGH (ref 70–99)
Glucose-Capillary: 92 mg/dL (ref 70–99)
Glucose-Capillary: 198 mg/dL — ABNORMAL HIGH (ref 70–99)
Glucose-Capillary: 257 mg/dL — ABNORMAL HIGH (ref 70–99)
Glucose-Capillary: 266 mg/dL — ABNORMAL HIGH (ref 70–99)
Glucose-Capillary: 239 mg/dL — ABNORMAL HIGH (ref 70–99)

## 2024-07-13 MED ORDER — PROCHLORPERAZINE EDISYLATE 10 MG/2ML IJ SOLN
5.0000 mg | Freq: Four times a day (QID) | INTRAMUSCULAR | Status: DC | PRN
  Administered 2024-07-13: 5 mg via INTRAVENOUS
  Filled 2024-07-13: qty 2

## 2024-07-13 MED ORDER — HYDROXYZINE HCL 10 MG PO TABS: 10.0000 mg | ORAL_TABLET | Freq: Three times a day (TID) | ORAL | Status: DC | PRN

## 2024-07-13 MED ORDER — MIRTAZAPINE 15 MG PO TABS
15.0000 mg | ORAL_TABLET | Freq: Every day | ORAL | Status: DC
  Administered 2024-07-13: 15 mg via ORAL
  Filled 2024-07-13: qty 1

## 2024-07-13 MED ORDER — INSULIN GLARGINE-YFGN 100 UNIT/ML ~~LOC~~ SOLN: 18.0000 [IU] | Freq: Every day | SUBCUTANEOUS | Status: DC

## 2024-07-13 MED ORDER — ACETAMINOPHEN 650 MG RE SUPP: 650.0000 mg | Freq: Four times a day (QID) | RECTAL | Status: DC | PRN

## 2024-07-13 MED ORDER — FLUOXETINE HCL 20 MG PO CAPS
40.0000 mg | ORAL_CAPSULE | Freq: Every day | ORAL | Status: DC
  Administered 2024-07-13 – 2024-07-14 (×2): 40 mg via ORAL
  Filled 2024-07-13 (×2): qty 2

## 2024-07-13 MED ORDER — SODIUM CHLORIDE 0.9% FLUSH: 10.0000 mL | INTRAVENOUS | Status: DC | PRN

## 2024-07-13 MED ORDER — SODIUM CHLORIDE 0.9 % IV SOLN
1.0000 g | Freq: Once | INTRAVENOUS | Status: AC
  Administered 2024-07-13: 1 g via INTRAVENOUS
  Filled 2024-07-13: qty 10

## 2024-07-13 MED ORDER — MORPHINE SULFATE (PF) 2 MG/ML IV SOLN
2.0000 mg | INTRAVENOUS | Status: DC | PRN
  Administered 2024-07-13: 2 mg via INTRAVENOUS
  Filled 2024-07-13: qty 1

## 2024-07-13 MED ORDER — ATORVASTATIN CALCIUM 40 MG PO TABS
80.0000 mg | ORAL_TABLET | Freq: Every evening | ORAL | Status: DC
  Administered 2024-07-13: 80 mg via ORAL
  Filled 2024-07-13: qty 2

## 2024-07-13 MED ORDER — GUAIFENESIN ER 600 MG PO TB12: 600.0000 mg | ORAL_TABLET | Freq: Two times a day (BID) | ORAL | Status: DC | PRN

## 2024-07-13 MED ORDER — HALOPERIDOL 0.5 MG PO TABS
2.0000 mg | ORAL_TABLET | Freq: Two times a day (BID) | ORAL | Status: DC
  Administered 2024-07-13 – 2024-07-14 (×4): 2 mg via ORAL
  Filled 2024-07-13 (×4): qty 4
  Filled 2024-07-13: qty 2

## 2024-07-13 MED ORDER — ACETAMINOPHEN 325 MG PO TABS: 650.0000 mg | ORAL_TABLET | Freq: Four times a day (QID) | ORAL | Status: DC | PRN

## 2024-07-13 MED ORDER — LACTULOSE 10 GM/15ML PO SOLN: 30.0000 g | Freq: Two times a day (BID) | ORAL | Status: DC | PRN

## 2024-07-13 MED ORDER — ENOXAPARIN SODIUM 80 MG/0.8ML IJ SOSY
80.0000 mg | PREFILLED_SYRINGE | Freq: Two times a day (BID) | INTRAMUSCULAR | Status: DC
  Administered 2024-07-13 – 2024-07-14 (×4): 80 mg via SUBCUTANEOUS
  Filled 2024-07-13 (×5): qty 0.8

## 2024-07-13 MED ORDER — POLYETHYLENE GLYCOL 3350 17 G PO PACK
17.0000 g | PACK | Freq: Every day | ORAL | Status: DC | PRN
Start: 2024-07-13 — End: 2024-07-14

## 2024-07-13 MED ORDER — BISACODYL 5 MG PO TBEC: 5.0000 mg | DELAYED_RELEASE_TABLET | Freq: Every day | ORAL | Status: DC | PRN

## 2024-07-13 MED ORDER — HYDRALAZINE HCL 20 MG/ML IJ SOLN: 5.0000 mg | INTRAMUSCULAR | Status: DC | PRN

## 2024-07-13 MED ORDER — SODIUM CHLORIDE 0.9 % IV SOLN
1.0000 g | INTRAVENOUS | Status: DC
  Administered 2024-07-14: 1 g via INTRAVENOUS
  Filled 2024-07-13: qty 10

## 2024-07-13 MED ORDER — PREGABALIN 50 MG PO CAPS
50.0000 mg | ORAL_CAPSULE | Freq: Three times a day (TID) | ORAL | Status: DC
  Administered 2024-07-13 – 2024-07-14 (×4): 50 mg via ORAL
  Filled 2024-07-13 (×4): qty 1

## 2024-07-13 MED ORDER — NYSTATIN 100000 UNIT/GM EX POWD
Freq: Three times a day (TID) | CUTANEOUS | Status: DC
  Filled 2024-07-13 (×2): qty 15

## 2024-07-13 MED ORDER — SODIUM CHLORIDE 0.9% FLUSH
10.0000 mL | Freq: Two times a day (BID) | INTRAVENOUS | Status: DC
  Administered 2024-07-13 – 2024-07-14 (×2): 10 mL

## 2024-07-13 MED ORDER — INSULIN GLARGINE-YFGN 100 UNIT/ML ~~LOC~~ SOLN
18.0000 [IU] | Freq: Every day | SUBCUTANEOUS | Status: DC
  Administered 2024-07-13 – 2024-07-14 (×2): 18 [IU] via SUBCUTANEOUS
  Filled 2024-07-13 (×2): qty 0.18

## 2024-07-13 MED ORDER — INSULIN ASPART 100 UNIT/ML IJ SOLN
0.0000 [IU] | Freq: Three times a day (TID) | INTRAMUSCULAR | Status: DC
  Administered 2024-07-13: 1 [IU] via SUBCUTANEOUS
  Administered 2024-07-13: 3 [IU] via SUBCUTANEOUS
  Administered 2024-07-14: 1 [IU] via SUBCUTANEOUS
  Administered 2024-07-14: 2 [IU] via SUBCUTANEOUS
  Filled 2024-07-13 (×2): qty 1
  Filled 2024-07-13: qty 2
  Filled 2024-07-13: qty 3

## 2024-07-13 MED ORDER — SENNOSIDES-DOCUSATE SODIUM 8.6-50 MG PO TABS: 1.0000 | ORAL_TABLET | Freq: Every evening | ORAL | Status: DC | PRN

## 2024-07-13 MED ORDER — OXYCODONE HCL 5 MG PO TABS
10.0000 mg | ORAL_TABLET | Freq: Four times a day (QID) | ORAL | Status: DC | PRN
  Administered 2024-07-13 – 2024-07-14 (×3): 10 mg via ORAL
  Filled 2024-07-13 (×3): qty 2

## 2024-07-13 MED ORDER — CHLORHEXIDINE GLUCONATE CLOTH 2 % EX PADS: 6.0000 | MEDICATED_PAD | Freq: Every day | CUTANEOUS | Status: DC

## 2024-07-13 MED ORDER — SODIUM CHLORIDE 0.9% FLUSH
3.0000 mL | Freq: Two times a day (BID) | INTRAVENOUS | Status: DC
  Administered 2024-07-13 – 2024-07-14 (×3): 3 mL via INTRAVENOUS

## 2024-07-13 MED ORDER — HYDROMORPHONE HCL 1 MG/ML IJ SOLN
0.5000 mg | INTRAMUSCULAR | Status: AC | PRN
  Administered 2024-07-13 (×2): 0.5 mg via INTRAVENOUS
  Filled 2024-07-13: qty 0.5
  Filled 2024-07-13: qty 1

## 2024-07-13 MED ORDER — INSULIN ASPART 100 UNIT/ML IJ SOLN
0.0000 [IU] | Freq: Every day | INTRAMUSCULAR | Status: DC
  Administered 2024-07-13: 2 [IU] via SUBCUTANEOUS
  Filled 2024-07-13: qty 2

## 2024-07-13 MED ORDER — OXYCODONE HCL 5 MG PO TABS: 5.0000 mg | ORAL_TABLET | ORAL | Status: DC | PRN

## 2024-07-13 MED ORDER — ORAL CARE MOUTH RINSE: 15.0000 mL | OROMUCOSAL | Status: DC | PRN

## 2024-07-13 MED ORDER — LIDOCAINE 5 % EX PTCH
1.0000 | MEDICATED_PATCH | Freq: Every day | CUTANEOUS | Status: DC
Start: 2024-07-13 — End: 2024-07-14
  Administered 2024-07-13 – 2024-07-14 (×2): 1 via TRANSDERMAL
  Filled 2024-07-13 (×2): qty 1

## 2024-07-13 NOTE — Assessment & Plan Note (Addendum)
 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

## 2024-07-13 NOTE — Assessment & Plan Note (Addendum)
 A1c 7.2, reasonable control Goal glucose is 100-200 per family Hold metformin Resume glargine Cover with moderate-scale SSI Carb modified diet

## 2024-07-13 NOTE — Progress Notes (Signed)
 MD yates notified/ paged @ 1549 for change in patient status, patient blood pressure 173/64, blood glucose 257. No PRNS for blood pressure and insulin  order was to not administer until 5pm.  No new orders per MD Barbarann 1603 Blood glucose rechecked at 1610 result:266 3 units given @1617  per sliding scale order    1705: Order for long acting insulin  starts 11/11(MD yates) 1709: Order for PRN hydralazine  for systolic >180 (MD yates)

## 2024-07-13 NOTE — Assessment & Plan Note (Addendum)
 Body mass index is 32.46 kg/m.Brenda [name]  She has been losing weight in the setting of malignancy Significantly low or high BMI is associated with higher medical risk including morbidity and mortality

## 2024-07-13 NOTE — Progress Notes (Addendum)
 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  She was given nitrofurantoin 2 days PTA for UTI.  No evidence of sepsis, started on Ceftriaxone .   Assessment & Plan Acute UTI Not septic on admission    Reports urinary symptoms at home as well as R-sided flank pain UA not overly suggestive of UTI, but she has been on recent antibiotics so suspect inadequately treated UTI Urine culture pending Blood cultures pending Continue ceftriaxone  for now If flank pain is not improved by tomorrow, consider imaging to evaluate for other causes Acute metabolic encephalopathy  Intermittent disorientation likely secondary to UTI  Expand workup tomorrow if she fails to improve as expected with treatment of UTI Reports some difficulty swallowing; was to have home PT/OT/SLP but they only came once and she hasn't heard back from them - will order SLP evaluation Endometrial carcinoma (HCC) S/p TAH and radiation at time of initial diagnosis in 2023 Recurrence in L psoas muscle, lung, and with LAD, stage 4 disease Undergoing treatment with carboplatin , paclitaxel , and pembrolizumab  under the care of Dr. Lonn   She has only completed 2 courses of chemo thus far due to complications Will notify Dr. Lonn of admission via inbox message, as she is currently unavailable by Secure Chat Type 2 diabetes mellitus with complication, with long-term current use of insulin  (HCC) A1c 7.2, reasonable control Goal glucose is 100-200 per family Hold metformin Resume glargine Cover with moderate-scale SSI Carb modified diet  Anxiety with depression Continue Prozac , Remeron, Haldol, and as-needed  hydroxyzine  Chronic diastolic CHF (congestive heart failure) (HCC) Recent echo (9/29) with preserved ER, grade 2 DD Appears compensated at this time Chronic hypoxic respiratory failure (HCC) Obstructive sleep apnea Continue supplemental O2 and CPAP while sleeping  History of CAD (coronary artery disease) History of stroke Continue Lipitor   No ASA since she is on treatment-dose Lovenox   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 Continue treatment-dose Lovenox  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 [facility] documents reviewed Patient will need a gold out of facility DNR form at the time of discharge       Consultants: None  Procedures: None  Antibiotics: Ceftriaxone  11/10-  30 Day Unplanned Readmission Risk Score    Flowsheet Row ED to Hosp-Admission (Current) from 07/12/2024 in [facility]  30 Day Unplanned Readmission Risk Score (%) 44.05 Filed at 07/13/2024 0801    This score is the patient's risk of an unplanned readmission within 30 days of being discharged (0 -100%). The score is based on dignosis, age, lab data, medications, orders, and past utilization.  Low:  0-14.9   Medium: 15-21.9   High: 22-29.9   Extreme: 30 and above           Subjective: Feeling slightly better.  R flank pain.  Confusion is improved  at this time.   Objective: Vitals:   07/13/24 1058 07/13/24 1521  BP: 138/64 (!) 173/64  Pulse: 87 93  Resp:  16  Temp: 97.7 F (36.5 C) 97.8 F (36.6 C)  SpO2: 100% 98%    Intake/Output Summary (Last 24 hours) at 07/13/2024 1715 Last data filed at 07/13/2024 1245 Gross per 24 hour  Intake 581.93 ml  Output --  Net 581.93 ml   Filed Weights   07/12/24 2300  Weight: 80.5 kg    Exam:  General:  Appears calm and comfortable and is in NAD; no scalp hair but significant facial hirsuitism Eyes:  normal lids; L pupillary dilatation; does not see out of L eye at baseline  ENT:  grossly normal hearing, lips & tongue, mmm Cardiovascular:  RRR. No LE edema.  Respiratory:   CTA bilaterally with no wheezes/rales/rhonchi.  Normal respiratory effort. Abdomen:  soft, NT, ND; R CVAT Skin:  no rash or induration seen on limited exam Musculoskeletal:  grossly normal tone BUE/BLE, good ROM, no bony abnormality Psychiatric:  blunted mood and affect, speech fluent and appropriate, AOx3 Neurologic:  CN 2-12 grossly intact, moves all extremities in coordinated fashion  Data Reviewed: I have reviewed the patient's lab results since admission.  Pertinent labs for today include:   Normal BMP WBC 3.7 Hgb 8.7 Urine culture pending Blood cultures NTD    Family Communication: Daughter was present  Mobility: PT/OT Consulted     Code Status: Limited: Do not attempt resuscitation (DNR) -DNR-LIMITED -Do Not Intubate/DNI     Disposition: Status is: Inpatient Remains inpatient appropriate because: ongoing monitoring     Time spent: 50 minutes  Unresulted Labs (From admission, onward)     Start     Ordered   07/13/24 0500  Basic metabolic panel  Daily,   R      07/13/24 0119   07/13/24 0500  CBC  Daily,   R      07/13/24 0119   07/12/24 2314  Urine Culture  Once,   R        07/12/24 2314             Author: Delon Herald, MD 07/13/2024 5:15 PM  For on call review  [email or web address].

## 2024-07-13 NOTE — Assessment & Plan Note (Signed)
 Recent echo (9/29) with preserved ER, grade 2 DD Appears compensated at this time

## 2024-07-13 NOTE — Plan of Care (Signed)

## 2024-07-13 NOTE — H&P (Addendum)
 History and Physical    Brenda [name] FMW:[id number] DOB: 07-10-1961 DOA: 07/12/2024  PCP: [facility]   Patient coming from: Home   Chief Complaint: Dysuria, urinary urgency and frequency, confusion   HPI: Brenda [name] is a 63 y.o. female with medical history significant for hypertension, hyperlipidemia, diabetes mellitus, depression, anxiety, CVA, history of DVT and PE, OSA, chronic hypoxic respiratory failure, metastatic endometrial cancer, and chronic cancer-related pain who presents with dysuria, urinary urgency and frequency, and confusion.   She was started on nitrofurantoin for these symptoms and has taken 2 doses but had increased confusion last night, prompting her presentation to the ED.   ED Course: Upon arrival to the ED, patient is found to be afebrile and saturating well on 2 L/min of supplemental oxygen  with mild tachypnea, normal HR, and stable BP.  Labs are most notable for normal creatinine, normal WBC, and normal lactate.  UA notable for bacteriuria and pyuria.  There are no acute findings on chest x-ray.  Blood and urine cultures were collected in the ED and the patient was treated with Rocephin .  Review of Systems:  All other systems reviewed and apart from HPI, are negative.  Past Medical History:  Diagnosis Date   Anemia    Arthritis    back, hands, hips (02/22/2015)   CAD (coronary artery disease)    a. complex LAD/diagonal bifurcation PCI in 2010. b. STEMI 10/2019 s/p PTCA/DES x1 to mLAD overlapping the old stent, residual disease treated medicaly.   Cancer [facility])    uterine   CHF (congestive heart failure) (HCC)    Depression    Diabetes mellitus (HCC)    started when I was pregnant; not sure if it was type 1 or type 2    History of radiation therapy    Endometrium- HDR 01/22/22-03/07/22- Dr. Lynwood Nasuti   Hypercholesterolemia    Hypertension    Morbid obesity (HCC)    Myocardial infarction (HCC)    mild x 3    Neuromuscular disorder (HCC)    neuropathy feet   Sleep apnea    cpap/    Past Surgical History:  Procedure Laterality Date   CARDIAC CATHETERIZATION  12/02/2012   CHOLECYSTECTOMY OPEN  09/04/1987   CORONARY ANGIOPLASTY WITH STENT PLACEMENT  09/03/2009   CORONARY/GRAFT ACUTE MI REVASCULARIZATION N/A 10/26/2019   Procedure: CORONARY/GRAFT ACUTE MI REVASCULARIZATION;  Surgeon: Verlin Lonni BIRCH, MD;  Location: [facility] INVASIVE CV LAB;  Service: Cardiovascular;  Laterality: N/A;   INCISIONAL HERNIA REPAIR N/A 12/09/2021   Procedure: LAPAROSCOPIC REPAIR UMBILICAL HERNIA WITH MESH;  Surgeon: Vernetta Berg, MD;  Location: [facility] ORS;  Service: General;  Laterality: N/A;   IR IMAGING GUIDED PORT INSERTION  04/08/2024   LEFT HEART CATH AND CORONARY ANGIOGRAPHY N/A 10/26/2019   Procedure: LEFT HEART CATH AND CORONARY ANGIOGRAPHY;  Surgeon: Verlin Lonni BIRCH, MD;  Location: [facility] INVASIVE CV LAB;  Service: Cardiovascular;  Laterality: N/A;   LEFT HEART CATHETERIZATION WITH CORONARY ANGIOGRAM N/A 12/25/2012   Procedure: LEFT HEART CATHETERIZATION WITH CORONARY ANGIOGRAM;  Surgeon: Ozell Fell, MD;  Location: [facility] CATH LAB;  Service: Cardiovascular;  Laterality: N/A;   RADIOLOGY WITH ANESTHESIA N/A 04/21/2024   Procedure: MRI WITH ANESTHESIA;  Surgeon: Radiologist, Medication, MD;  Location: [facility] OR;  Service: Radiology;  Laterality: N/A;  BRAIN W/ W/O   ROBOTIC ASSISTED TOTAL HYSTERECTOMY WITH BILATERAL SALPINGO OOPHERECTOMY N/A 12/06/2021   Procedure: XI ROBOTIC ASSISTED TOTAL HYSTERECTOMY WITH BILATERAL SALPINGO OOPHORECTOMY;  Surgeon: [name] SAUNDERS,  MD;  Location: [facility] ORS;  Service: Gynecology;  Laterality: N/A;   UMBILICAL HERNIA REPAIR  05/04/1989   doesn't think they used mesh    Social History:   reports that she quit smoking about 37 years ago. Her smoking use included cigarettes and cigars. She started smoking about 38 years ago. She has a 0.5 pack-year smoking history. She has never used  smokeless tobacco. She reports that she does not drink alcohol and does not use drugs.  Allergies  Allergen Reactions   Hydrocodone Shortness Of Breath, Dermatitis and Other (See Comments)    I forget to breathe.   Clopidogrel Rash and Dermatitis    Family History  Problem Relation Age of Onset   Coronary artery disease Mother        in her mid 29s   Hypertension Mother    Cancer Mother        skin   Heart attack Father        in his mid 41s   COPD Father    Stroke Sister    Coronary artery disease Sister    Diabetes Sister    Other Half-Sister    Thyroid  cancer Daughter    Prostate cancer Neg Hx    Colon cancer Neg Hx    Breast cancer Neg Hx    Endometrial cancer Neg Hx    Ovarian cancer Neg Hx    Pancreatic cancer Neg Hx      Prior to Admission medications   Medication Sig Start Date End Date Taking? Authorizing Provider  acetaminophen  (TYLENOL ) 500 MG tablet Take 1-2 tablets (500-1,000 mg total) by mouth every 8 (eight) hours as needed for mild pain (pain score 1-3) or moderate pain (pain score 4-6). 03/30/24 03/30/25  Hongalgi, Anand D, MD  atorvastatin  (LIPITOR ) 80 MG tablet Take 1 tablet (80 mg total) by mouth every evening. 06/11/24   Jerilynn Daphne SAILOR, NP  Cholecalciferol  (VITAMIN D3 SUPER STRENGTH) 50 MCG (2000 UT) CAPS Take 2,000 Units by mouth in the morning.    [provider]  enoxaparin  (LOVENOX ) 80 MG/0.8ML injection Inject 0.8 mLs (80 mg total) into the skin every 12 (twelve) hours. 06/22/24 07/22/24  Elgergawy, Brayton RAMAN, MD  FLUoxetine  (PROZAC ) 40 MG capsule Take 1 capsule (40 mg total) by mouth daily. 06/11/24   Jerilynn Daphne SAILOR, NP  guaiFENesin  (MUCINEX ) 600 MG 12 hr tablet Take 1 tablet (600 mg total) by mouth 2 (two) times daily as needed for cough or to loosen phlegm. 05/05/24   Amin, Sumayya, MD  haloperidol (HALDOL) 1 MG tablet Take 2 tablets (2 mg total) by mouth 2 (two) times daily. 06/22/24   Elgergawy, Dawood S, MD  hydrOXYzine (ATARAX) 10  MG tablet Take 1 tablet (10 mg total) by mouth 3 (three) times daily as needed for anxiety. 06/22/24   Elgergawy, Brayton RAMAN, MD  insulin  degludec (TRESIBA) 100 UNIT/ML FlexTouch Pen Inject 25 Units into the skin daily. 06/29/24   Lonn Hicks, MD  insulin  regular (NOVOLIN  R) 100 units/mL injection Inject 5 Units into the skin 3 (three) times daily before meals. Patient not taking: Reported on 07/07/2024    [provider]  lactulose  (CHRONULAC ) 10 GM/15ML solution Take 45 mLs (30 g total) by mouth 2 (two) times daily as needed for mild constipation or moderate constipation. 05/05/24   Caleen Qualia, MD  lidocaine  (LIDODERM ) 5 % Place 1 patch onto the skin daily. 04/27/24   [provider]  metFORMIN (GLUCOPHAGE-XR) 500 MG 24 hr  tablet Take 2 tablets (1,000 mg total) by mouth 2 (two) times daily with a meal. 06/11/24   Jerilynn Daphne SAILOR, NP  mirtazapine (REMERON) 15 MG tablet Take 1 tablet (15 mg total) by mouth at bedtime. 06/22/24   Elgergawy, Brayton RAMAN, MD  Misc Natural Products (NEURIVA PO) Take 1 tablet by mouth daily.    [provider]  Misc. Devices MISC Portable oxygen  concentrator, 2L oxygen .  Diagnoses-chronic respiratory failure with hypoxia 11/27/21   Delbert Clam, MD  nitroGLYCERIN  (NITROSTAT ) 0.4 MG SL tablet Dissolve 1 tablet under the tongue every 5 minutes as needed for chest pain. Max of 3 doses, then 911. Patient not taking: Reported on 07/07/2024 01/25/23   Lelon Hamilton T, PA-C  nystatin cream (MYCOSTATIN) Apply topically 2 (two) times daily. Patient taking differently: Apply topically 2 (two) times daily as needed for dry skin (yeast). 06/11/24   Jerilynn Daphne SAILOR, NP  Oxycodone  HCl 10 MG TABS Take 1 tablet (10 mg total) by mouth every 6 (six) hours as needed (pain). 06/29/24   Lonn Hicks, MD  polyethylene glycol powder (GLYCOLAX /MIRALAX ) 17 GM/SCOOP powder Dissolve 1 capful (17g) in 4-8 ounces of liquid and take by mouth daily as needed for mild  constipation. 06/11/24   Jerilynn Daphne SAILOR, NP  pregabalin  (LYRICA ) 50 MG capsule Take 1 capsule (50 mg total) by mouth 3 (three) times daily. 06/11/24   Jerilynn Daphne SAILOR, NP  prochlorperazine  (COMPAZINE ) 10 MG tablet Take 1 tablet (10 mg total) by mouth every 6 (six) hours as needed for nausea or vomiting. 06/30/24   Lonn Hicks, MD  senna-docusate (SENOKOT-S) 8.6-50 MG tablet Take 1-2 tablets by mouth 2 (two) times daily between meals as needed for mild constipation or moderate constipation. Patient taking differently: Take 1-2 tablets by mouth as needed for mild constipation or moderate constipation. 04/24/24   Gonfa, Taye T, MD    Physical Exam: Vitals:   07/12/24 2300  BP: (!) 175/73  Pulse: 84  Resp: (!) 22  Temp: 98.5 F (36.9 C)  TempSrc: Oral  SpO2: 100%  Weight: 80.5 kg  Height: 5' 2 (1.575 m)    Constitutional: NAD, no diaphoresis   Eyes: PERTLA, lids and conjunctivae normal ENMT: Mucous membranes are moist. Posterior pharynx clear of any exudate or lesions.   Neck: supple, no masses  Respiratory: no wheezing, no crackles. No accessory muscle use.  Cardiovascular: S1 & S2 heard, regular rate and rhythm. No extremity edema.  Abdomen: No tenderness, soft. Bowel sounds active.  Musculoskeletal: no clubbing / cyanosis. No joint deformity upper and lower extremities.   Skin: Erythema and maceration involving intertriginous areas. Warm, dry, well-perfused. Neurologic: CN 2-12 grossly intact. Moving all extremities. Alert and oriented to person, place, and situation.  Psychiatric: Calm. Cooperative.    Labs and Imaging on Admission: I have personally reviewed following labs and imaging studies  CBC: Recent Labs  Lab 07/12/24 2313  WBC 4.0  NEUTROABS 2.3  HGB 9.3*  HCT 30.1*  MCV 86.0  PLT 258   Basic Metabolic Panel: Recent Labs  Lab 07/12/24 2313  NA 134*  K 4.3  CL 100  CO2 22  GLUCOSE 183*  BUN 9  CREATININE 0.54  CALCIUM  9.1   GFR: Estimated  Creatinine Clearance: 72.6 mL/min (by C-G formula based on SCr of 0.54 mg/dL). Liver Function Tests: Recent Labs  Lab 07/12/24 2313  AST 24  ALT 24  ALKPHOS 70  BILITOT 0.2  PROT 6.8  ALBUMIN 3.0*  No results for input(s): LIPASE, AMYLASE in the last 168 hours. No results for input(s): AMMONIA in the last 168 hours. Coagulation Profile: Recent Labs  Lab 07/12/24 2313  INR 1.0   Cardiac Enzymes: No results for input(s): CKTOTAL, CKMB, CKMBINDEX, TROPONINI in the last 168 hours. BNP (last 3 results) Recent Labs    05/22/24 0630  PROBNP 3,514.0*   HbA1C: No results for input(s): HGBA1C in the last 72 hours. CBG: No results for input(s): GLUCAP in the last 168 hours. Lipid Profile: No results for input(s): CHOL, HDL, LDLCALC, TRIG, CHOLHDL, LDLDIRECT in the last 72 hours. Thyroid  Function Tests: No results for input(s): TSH, T4TOTAL, FREET4, T3FREE, THYROIDAB in the last 72 hours. Anemia Panel: No results for input(s): VITAMINB12, FOLATE, FERRITIN, TIBC, IRON, RETICCTPCT in the last 72 hours. Urine analysis:    Component Value Date/Time   COLORURINE YELLOW 07/12/2024 2314   APPEARANCEUR CLEAR 07/12/2024 2314   LABSPEC 1.008 07/12/2024 2314   PHURINE 5.0 07/12/2024 2314   GLUCOSEU NEGATIVE 07/12/2024 2314   HGBUR NEGATIVE 07/12/2024 2314   BILIRUBINUR NEGATIVE 07/12/2024 2314   BILIRUBINUR neg 08/16/2016 1147   KETONESUR NEGATIVE 07/12/2024 2314   PROTEINUR NEGATIVE 07/12/2024 2314   UROBILINOGEN 2.0 08/16/2016 1147   UROBILINOGEN 2.0 (H) 02/22/2015 2143   NITRITE NEGATIVE 07/12/2024 2314   LEUKOCYTESUR MODERATE (A) 07/12/2024 2314   Sepsis Labs: @LABRCNTIP (procalcitonin:4,lacticidven:4) )No results found for this or any previous visit (from the past 240 hours).   Radiological Exams on Admission: DG Chest Port 1 View Result Date: 07/12/2024 EXAM: 1 VIEW(S) XRAY OF THE CHEST 07/12/2024 11:20:58 PM COMPARISON:  05/31/2024 CLINICAL HISTORY: Questionable sepsis - evaluate for abnormality FINDINGS: LINES, TUBES AND DEVICES: Right chest wall Port-A-Cath in place with tip at the superior cavoatrial junction. Coronary stenting noted. LUNGS AND PLEURA: Similar elevation of right hemidiaphragm. No focal pulmonary opacity. No pleural effusion. No pneumothorax. HEART AND MEDIASTINUM: Aortic atherosclerotic calcification. BONES AND SOFT TISSUES: No acute osseous abnormality. IMPRESSION: 1. No acute cardiopulmonary findings. Electronically signed by: Oneil Devonshire MD 07/12/2024 11:27 PM EST RP Workstation: [id number]     Assessment/Plan   1. UTI  - Not septic on admission    - Continue Rocephin , follow cultures and clinical course    2. Acute encephalopathy  - Intermittent disorientation likely secondary to UTI  - Expand workup if fails to improve as expected with treatment of UTI   3. Endometrial cancer with metastases  - Undergoing treatment with carboplatin , paclitaxel , and pembrolizumab  under the care of Dr. Lonn    4. Depression, anxiety  - Continue Prozac , Remeron, Haldol, and as-needed hydroxyzine    5. Hx of PE and DVT   - Continue Lovenox     6. Hx of CVA  - Continue Lipitor  and treatment-dose Lovenox    7. Type II DM   - A1c was 7.2% in September 2025   - Check CBGs and use low-intensity SSI for now   8. OSA; chronic hypoxic respiratory failure  - Continue supplemental O2 and CPAP while sleeping   9. Chronic pain - Continue Lyrica  and as-needed oxycodone      DVT prophylaxis: Treatment-dose Lovenox   Code Status: Full  Level of Care: Level of care: Telemetry Family Communication: Husband at bedside   Disposition Plan:  Patient is from: Home  Anticipated d/c is to: Home  Anticipated d/c date is: 07/15/24  Patient currently: Pending treatment of UTI, clinical improvement  Consults called: None  Admission status: Inpatient     Dahlila Pfahler S Emmarose Klinke,  MD [facility]  07/13/2024,  1:19 AM

## 2024-07-13 NOTE — Assessment & Plan Note (Signed)
 Continue Prozac , Remeron, Haldol, and as-needed hydroxyzine

## 2024-07-13 NOTE — Assessment & Plan Note (Signed)
 Continue pregabalin , lidocaine  patch, and as-needed oxycodone 

## 2024-07-13 NOTE — Assessment & Plan Note (Signed)
 Continue supplemental O2 and CPAP while sleeping

## 2024-07-13 NOTE — Progress Notes (Signed)
   07/13/24 2203  BiPAP/CPAP/SIPAP  BiPAP/CPAP/SIPAP Pt Type Adult  BiPAP/CPAP/SIPAP Resmed  Mask Type Full face mask  Dentures removed? Not applicable  Mask Size Medium  Flow Rate 2 lpm  Patient Home Machine No  Patient Home Mask No  Patient Home Tubing No  Auto Titrate Yes  Minimum cmH2O 4 cmH2O  Maximum cmH2O 15 cmH2O  Device Plugged into RED Power Outlet Yes

## 2024-07-13 NOTE — Hospital Course (Signed)
 63yo with h/o HTN, HLD, DM, depression/anxiety, CVA, DVT/PE, OSA on CPAP, and metastatic endometrial cancer with chronic cancer-related pain who presented on 11/9 with urinary symptoms and AMS.  She was given nitrofurantoin 2 days PTA for UTI.  No evidence of sepsis, started on Ceftriaxone .

## 2024-07-13 NOTE — Assessment & Plan Note (Addendum)
 Not septic on admission    Reports urinary symptoms at home as well as R-sided flank pain UA not overly suggestive of UTI, but she has been on recent antibiotics so suspect inadequately treated UTI Urine culture pending Blood cultures pending Continue ceftriaxone  for now If flank pain is not improved by tomorrow, consider imaging to evaluate for other causes

## 2024-07-13 NOTE — Assessment & Plan Note (Addendum)
 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 Lonn   She has only completed 2 courses of chemo thus far due to complications Will notify Dr. Lonn of admission via inbox message, as she is currently unavailable by Secure Chat

## 2024-07-13 NOTE — Progress Notes (Signed)
 Notified by nursing staff that patient was having worsening pain.  Assuming it was ongoing flank pain, I ordered a CT A/P (now canceled).  Patient instead reported acute onset of L-sided CP after she got up to the bedside commode.  Pain is contact, no change in baseline SOB.  Pleuritic on exam.  EKG ordered, will cycle troponin.  EKG compared to prior and is without significant change.  Appears comfortable.  Will give morphine  for breakthrough pain and continue to follow for now.  Will notify night coverage.   Delon CHARLENA Herald, M.D.

## 2024-07-13 NOTE — ED Notes (Signed)
 Gave report to RN taking over patient care at [facility]. Will contact [facility] for transfer. [place] #: [id number]

## 2024-07-13 NOTE — Assessment & Plan Note (Signed)
 H/o DVT/PE Continue treatment-dose Lovenox 

## 2024-07-13 NOTE — Assessment & Plan Note (Signed)
 I have discussed code status with the patient/family and they are in agreement that the patient would not desire resuscitation and would prefer to die a natural death should that situation arise. [facility] documents reviewed Patient will need a gold out of facility DNR form at the time of discharge

## 2024-07-13 NOTE — Assessment & Plan Note (Addendum)
 Intermittent disorientation likely secondary to UTI  Expand workup tomorrow if she fails to improve as expected with treatment of UTI Reports some difficulty swallowing; was to have home PT/OT/SLP but they only came once and she hasn't heard back from them - will order SLP evaluation

## 2024-07-14 ENCOUNTER — Other Ambulatory Visit (HOSPITAL_COMMUNITY): Payer: Self-pay

## 2024-07-14 ENCOUNTER — Telehealth: Payer: Self-pay

## 2024-07-14 DIAGNOSIS — N390 Urinary tract infection, site not specified: Secondary | ICD-10-CM | POA: Diagnosis not present

## 2024-07-14 LAB — CBC
HCT: 28.5 % — ABNORMAL LOW (ref 36.0–46.0)
Hemoglobin: 8.9 g/dL — ABNORMAL LOW (ref 12.0–15.0)
MCH: 26.6 pg (ref 26.0–34.0)
MCHC: 31.2 g/dL (ref 30.0–36.0)
MCV: 85.3 fL (ref 80.0–100.0)
Platelets: 259 K/uL (ref 150–400)
RBC: 3.34 MIL/uL — ABNORMAL LOW (ref 3.87–5.11)
RDW: 15.9 % — ABNORMAL HIGH (ref 11.5–15.5)
WBC: 4.1 K/uL (ref 4.0–10.5)
nRBC: 0 % (ref 0.0–0.2)

## 2024-07-14 LAB — BASIC METABOLIC PANEL WITH GFR
Anion gap: 10 (ref 5–15)
BUN: 8 mg/dL (ref 8–23)
CO2: 26 mmol/L (ref 22–32)
Calcium: 9.2 mg/dL (ref 8.9–10.3)
Chloride: 102 mmol/L (ref 98–111)
Creatinine, Ser: 0.48 mg/dL (ref 0.44–1.00)
GFR, Estimated: >60 mL/min (ref >60)
Glucose, Bld: 187 mg/dL — ABNORMAL HIGH (ref 70–99)
Potassium: 4.2 mmol/L (ref 3.5–5.1)
Sodium: 138 mmol/L (ref 135–145)

## 2024-07-14 LAB — GLUCOSE, CAPILLARY
Glucose-Capillary: 177 mg/dL — ABNORMAL HIGH (ref 70–99)
Glucose-Capillary: 225 mg/dL — ABNORMAL HIGH (ref 70–99)

## 2024-07-14 MED ORDER — CEFADROXIL 500 MG PO CAPS
500.0000 mg | ORAL_CAPSULE | Freq: Two times a day (BID) | ORAL | 0 refills | Status: AC
Start: 2024-07-14 — End: 2024-07-21
  Filled 2024-07-14: qty 14, 7d supply, fill #0

## 2024-07-14 MED ORDER — HEPARIN SOD (PORK) LOCK FLUSH 100 UNIT/ML IV SOLN
500.0000 [IU] | Freq: Once | INTRAVENOUS | Status: AC
  Administered 2024-07-14: 500 [IU] via INTRAVENOUS
  Filled 2024-07-14: qty 5

## 2024-07-14 NOTE — Progress Notes (Signed)
 Discharge meds in a secure bad delivered to patient in room by this RN

## 2024-07-14 NOTE — Assessment & Plan Note (Signed)
 Continue supplemental O2 and CPAP while sleeping

## 2024-07-14 NOTE — Assessment & Plan Note (Addendum)
 Not septic on admission    Reports urinary symptoms at home as well as R-sided flank pain UA not overly suggestive of UTI, but she has been on recent antibiotics so suspect inadequately treated UTI Urine culture with >100k colonies GNR Blood cultures NTD Ceftriaxone  -> Cefadroxil  for 7 more days (immunocompromised) Feeling better, wants to go home Will dc to PO antibiotics with plan to start nitrofurantoin for prophylaxis once complete (ordered previously)

## 2024-07-14 NOTE — Telephone Encounter (Signed)
 Called and LVM to patient - asking her to bring CPAP to her appt with Pawar on 07/15/24 if she is currently using one.  Also sending MyChart message.

## 2024-07-14 NOTE — Assessment & Plan Note (Addendum)
 Body mass index is 32.46 kg/m.Brenda [name]  She has been losing weight in the setting of malignancy Significantly low or high BMI is associated with higher medical risk including morbidity and mortality

## 2024-07-14 NOTE — Assessment & Plan Note (Signed)
 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

## 2024-07-14 NOTE — Assessment & Plan Note (Signed)
 Continue Prozac , Remeron, Haldol, and as-needed hydroxyzine

## 2024-07-14 NOTE — Progress Notes (Signed)
   07/14/24 1248  Spiritual Encounters  Type of Visit Initial  Care provided to: Pt and family  Reason for visit Advance directives  OnCall Visit No   Visited with patient and provided HCPOA education to patient and daughter. Patient and family will review and determine if they are interested in [facility] as patient's spouse is currently involved in patient's care. Form given to daughter who will complete if necessary.

## 2024-07-14 NOTE — Evaluation (Addendum)
 Clinical/Bedside Swallow Evaluation Patient Details  Name: Brenda [name] MRN: [id number] Date of Birth: 1960-11-16  Today's Date: 07/14/2024 Time: SLP Start Time (ACUTE ONLY): 1240 SLP Stop Time (ACUTE ONLY): 1310 SLP Time Calculation (min) (ACUTE ONLY): 30 min  Past Medical History:  Past Medical History:  Diagnosis Date   Anemia    Arthritis    back, hands, hips (02/22/2015)   CAD (coronary artery disease)    a. complex LAD/diagonal bifurcation PCI in 2010. b. STEMI 10/2019 s/p PTCA/DES x1 to mLAD overlapping the old stent, residual disease treated medicaly.   Cancer [facility])    uterine   CHF (congestive heart failure) (HCC)    Depression    Diabetes mellitus (HCC)    started when I was pregnant; not sure if it was type 1 or type 2    History of radiation therapy    Endometrium- HDR 01/22/22-03/07/22- Dr. Lynwood Nasuti   Hypercholesterolemia    Hypertension    Morbid obesity (HCC)    Myocardial infarction (HCC)    mild x 3   Neuromuscular disorder (HCC)    neuropathy feet   Sleep apnea    cpap/   Past Surgical History:  Past Surgical History:  Procedure Laterality Date   CARDIAC CATHETERIZATION  12/02/2012   CHOLECYSTECTOMY OPEN  09/04/1987   CORONARY ANGIOPLASTY WITH STENT PLACEMENT  09/03/2009   CORONARY/GRAFT ACUTE MI REVASCULARIZATION N/A 10/26/2019   Procedure: CORONARY/GRAFT ACUTE MI REVASCULARIZATION;  Surgeon: Verlin Lonni BIRCH, MD;  Location: [facility] INVASIVE CV LAB;  Service: Cardiovascular;  Laterality: N/A;   INCISIONAL HERNIA REPAIR N/A 12/09/2021   Procedure: LAPAROSCOPIC REPAIR UMBILICAL HERNIA WITH MESH;  Surgeon: Vernetta Berg, MD;  Location: [facility] ORS;  Service: General;  Laterality: N/A;   IR IMAGING GUIDED PORT INSERTION  04/08/2024   LEFT HEART CATH AND CORONARY ANGIOGRAPHY N/A 10/26/2019   Procedure: LEFT HEART CATH AND CORONARY ANGIOGRAPHY;  Surgeon: Verlin Lonni BIRCH, MD;  Location: [facility] INVASIVE CV LAB;  Service: Cardiovascular;   Laterality: N/A;   LEFT HEART CATHETERIZATION WITH CORONARY ANGIOGRAM N/A 12/25/2012   Procedure: LEFT HEART CATHETERIZATION WITH CORONARY ANGIOGRAM;  Surgeon: Ozell Fell, MD;  Location: [facility] CATH LAB;  Service: Cardiovascular;  Laterality: N/A;   RADIOLOGY WITH ANESTHESIA N/A 04/21/2024   Procedure: MRI WITH ANESTHESIA;  Surgeon: Radiologist, Medication, MD;  Location: [facility] OR;  Service: Radiology;  Laterality: N/A;  BRAIN W/ W/O   ROBOTIC ASSISTED TOTAL HYSTERECTOMY WITH BILATERAL SALPINGO OOPHERECTOMY N/A 12/06/2021   Procedure: XI ROBOTIC ASSISTED TOTAL HYSTERECTOMY WITH BILATERAL SALPINGO OOPHORECTOMY;  Surgeon: [name] SAUNDERS, MD;  Location: [facility] ORS;  Service: Gynecology;  Laterality: N/A;   UMBILICAL HERNIA REPAIR  05/04/1989   doesn't think they used mesh   HPI:  Patient is a 63 y.o. 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 She had recent admission following CVA (9/27-10/1/25) followed by CIR 10/2-10/10. She presented to ED on 10/17 with worsening confusion, visual hallucinations. Workup significant for UTI, finding of PE and splenic infarct. SLP ordered for swallow evaluation secondary to patient observed to have coughing when drinking water.  She has been seen by SLP on CIR for dysphagia and no indication of dysphagia noted. She now reports worsening issues with dysphagia to liquids since that time.    Assessment / Plan / Recommendation  Clinical Impression  Patient is not currently presenting without clinical s/s of dysphagia  as per this bedside swallow evaluation.  Slight facial asymmetry noted on left at rest but notable for bilateral movement.  SLP assessed her swallow via thin liquid, applesauce and graham cracker consumption.   Patient with timely swallow initiation and no overt s/s aspiration.   Given family/pt report of  increasing issues with coughing while taking pills with liquids and pt having difficulty positioning upright in bed, recommend consider slightly thicker or carbonated liquids (i.e. OJ, diet soda, etc) or applesauce,etc with medications to determine if this decreases her coughing and subsequently increases her comfort.  Carbonated beverage may improve sensation and improve timing and muscle contraction with swallowing.   SLP can not definitiely determine if compensation strategies will be helpful without instrumental assessment -but hopeful it may improve her symptoms clinially.  She has not had recurrent pneumonias nor required heimlich maneuver and thus has been managing her diet.   SLP is not recommending any changes to her PO diet.  Pt also has thyroid  nodule but does not know if she feels this may contribute to her symptoms. She denies any symptoms of sensing food/liquids retained in esophagus nor refluxing.   Recommend consider OP MBS to allow instrumental evaluation of oropharyngeal swallow if strategies recommended are not deemed helfpul- given her increased frequency of symptoms.  Educated pt and daughter to recommendations and all agreeable to plan. SLP Visit Diagnosis: Dysphagia, unspecified (R13.10)    Aspiration Risk  No limitations    Diet Recommendation Regular;Thin liquid (start intake with liquids due to xerostomia)    Liquid Administration via: Cup;Straw Medication Administration: Other (Comment) (applesauce OR slightly thicker liquids OR carbonated beverages with medications if helpful to decrease coughing) Supervision: Patient able to self feed Compensations: Slow rate;Small sips/bites    Other  Recommendations Oral Care Recommendations: Oral care BID     Assistance Recommended at Discharge  full  Functional Status Assessment Patient has had a recent decline in their functional status and demonstrates the ability to make significant improvements in function in a reasonable  and predictable amount of time.  Frequency and Duration min 1 x/week  1 week       Prognosis   N/a     Swallow Study   General Date of Onset: 06/21/24 HPI: Patient is a 63 y.o. 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 She had recent admission following CVA (9/27-10/1/25) followed by CIR 10/2-10/10. She presented to ED on 10/17 with worsening confusion, visual hallucinations. Workup significant for UTI, finding of PE and splenic infarct. SLP ordered for swallow evaluation secondary to patient observed to have coughing when drinking water.  She has been seen by SLP on CIR for dysphagia and no indication of dysphagia noted. She now reports worsening issues with dysphagia to liquids since that time. Previous Swallow Assessment: 06/04/24 BSE in CIR Diet Prior to this Study: Regular;Thin liquids (Level 0) Temperature Spikes Noted: No Respiratory Status: Nasal cannula (2 liters for 3 years) History of Recent Intubation: No Behavior/Cognition: Alert;Cooperative;Pleasant mood Oral Care Completed by SLP: No Oral Cavity - Dentition: Adequate natural dentition Vision: Functional for self-feeding Self-Feeding Abilities: Able to feed self Patient Positioning: Upright in bed Baseline Vocal Quality: Normal Volitional Cough: Strong    Oral/Motor/Sensory Function Overall Oral Motor/Sensory Function: Other (comment) (slight asymmetry at rest, bilateral facial movement noted)   Ice Chips Ice chips: Not tested   Thin Liquid Thin Liquid:  Within functional limits Presentation: Cup;Straw    Nectar Thick Nectar Thick Liquid: Not tested   Honey Thick Honey Thick Liquid: Not tested   Puree Puree: Within functional limits Presentation: Self Fed;Spoon   Solid     Solid: Within functional limits Presentation: Self Fed      Nicolas Emmie Caldron 07/14/2024,1:58 PM  Madelin POUR, MS [facility] SLP Acute Rehab Services Office [phone]

## 2024-07-14 NOTE — Evaluation (Signed)
 Physical Therapy Evaluation Patient Details Name: Brenda [name] MRN: [id number] DOB: May 07, 1961 Today's Date: 07/14/2024  History of Present Illness  Brenda [name] is a 63 yo female who presented on 11/9 with urinary symptoms and AMS.  She was given nitrofurantoin 2 days PTA for UTI.  No evidence of sepsis, started on Ceftriaxone . Was having HH services for therapy PMH: HTN, HLD, DM, depression/anxiety, CVA (CIR recently), DVT/PE, OSA on CPAP, and metastatic endometrial cancer with chronic cancer-related pain  Clinical Impression  Pt admitted with above diagnosis. PTA, pt reports using rollator around the home, w/c for community, family present and assisting with self care and family completes household chores. Daughter at bedside reports they are getting a bedrail for the pt's bed at home due to increasing difficulty getting out of bed. On eval, pt reports numbness/tingling in bil feet, demonstrates generalized weakness, decreased activity tolerance and needing min A for bed mobility and transfers using RW, requiring cues for sequencing. Daughter reports pt closer to baseline cognitively, still needing increased cues and time with cues to complete tasks. Pt on 2L without dyspnea noted during session. Recommend HHPT with 24/7 family assist at home. Pt currently with functional limitations due to the deficits listed below (see PT Problem List). Pt will benefit from acute skilled PT to increase their independence and safety with mobility to allow discharge.           If plan is discharge home, recommend the following: A little help with walking and/or transfers;A little help with bathing/dressing/bathroom;Assistance with cooking/housework;Assist for transportation;Help with stairs or ramp for entrance   Can travel by private vehicle   Yes    Equipment Recommendations None recommended by PT  Recommendations for Other Services       Functional Status Assessment Patient has had a recent  decline in their functional status and demonstrates the ability to make significant improvements in function in a reasonable and predictable amount of time.     Precautions / Restrictions Precautions Precautions: Fall Recall of Precautions/Restrictions: Intact Precaution/Restrictions Comments: low vision (L eye blind) Restrictions Weight Bearing Restrictions Per Provider Order: No      Mobility  Bed Mobility Overal bed mobility: Needs Assistance Bed Mobility: Supine to Sit, Sit to Supine     Supine to sit: Min assist, HOB elevated, Used rails Sit to supine: Min assist, HOB elevated, Used rails   General bed mobility comments: performed log roll for back comfort, pt able to roll onto side wtih bedrail use, cues for sequencing, min A to power up into sitting at bedside with increased time; min A to lift BLE back into bed    Transfers Overall transfer level: Needs assistance Equipment used: Rolling walker (2 wheels) Transfers: Sit to/from Stand Sit to Stand: Min assist           General transfer comment: min A to power up to standing, cues for hand placement, able to perform marching in place x8 reps before requesting to sit due to fatigue    Ambulation/Gait                  Stairs            Wheelchair Mobility     Tilt Bed    Modified Rankin (Stroke Patients Only)       Balance Overall balance assessment: Needs assistance Sitting-balance support: Feet supported Sitting balance-Leahy Scale: Fair Sitting balance - Comments: not challenged   Standing balance support: Reliant on assistive device for balance,  During functional activity, Bilateral upper extremity supported Standing balance-Leahy Scale: Poor                               Pertinent Vitals/Pain Pain Assessment Pain Assessment: Faces Faces Pain Scale: Hurts little more Pain Location: back, neck, LLE Pain Descriptors / Indicators: Aching, Constant, Discomfort, Dull Pain  Intervention(s): Limited activity within patient's tolerance, Monitored during session, Repositioned    Home Living Family/patient expects to be discharged to:: Private residence Living Arrangements: Spouse/significant other Available Help at Discharge: Family Type of Home: House Home Access: Ramped entrance       Home Layout: One level Home Equipment: Rollator (4 wheels);BSC/3in1;Shower seat;Grab bars - toilet;Grab bars - tub/shower;Wheelchair - manual;Hand held shower head;Adaptive equipment Additional Comments: baseline 2 ltrs O2, use of rollator in home, w/c for MD appts, ramp not sufficient as husband having difficulty with mngt, was recieving HH therapy services and was recently d/c'd CIR    Prior Function Prior Level of Function : Needs assist;History of Falls (last six months)             Mobility Comments: use of rollator in home, w/c for community access with assist, having issues on ramp and for bed mobility as well as w/c and rollator access into bathroom ADLs Comments: relatively mod I prior for BADL's except showers and assist with all IADL's from family     Extremity/Trunk Assessment   Upper Extremity Assessment Upper Extremity Assessment: Defer to OT evaluation    Lower Extremity Assessment Lower Extremity Assessment: Generalized weakness (AROM WFL, strength grossly 3+/5, reports numbness/tingling in bil feet only, pain in LLE with AROM reporting I have a tumor there)    Cervical / Trunk Assessment Cervical / Trunk Assessment: Normal  Communication   Communication Communication: Impaired Factors Affecting Communication: Difficulty expressing self (intermittently)    Cognition Arousal: Alert Behavior During Therapy: Flat affect                             Following commands: Impaired Following commands impaired: Follows one step commands inconsistently, Follows one step commands with increased time     Cueing Cueing Techniques: Verbal  cues, Tactile cues, Visual cues     General Comments General comments (skin integrity, edema, etc.): SpO2 on 2 ltrs 98%, issued neck pillow for comfort, daughter present in room, increased fatigue overall reported this am but improved from admission as per patient and family report    Exercises     Assessment/Plan    PT Assessment Patient needs continued PT services  PT Problem List Decreased strength;Decreased activity tolerance;Decreased balance;Decreased mobility;Decreased knowledge of use of DME;Decreased knowledge of precautions       PT Treatment Interventions DME instruction;Gait training;Functional mobility training;Therapeutic activities;Therapeutic exercise;Balance training;Neuromuscular re-education;Cognitive remediation;Patient/family education    PT Goals (Current goals can be found in the Care Plan section)  Acute Rehab PT Goals Patient Stated Goal: return home PT Goal Formulation: With patient/family Time For Goal Achievement: 07/28/24 Potential to Achieve Goals: Fair    Frequency Min 2X/week     Co-evaluation               AM-PAC PT 6 Clicks Mobility  Outcome Measure Help needed turning from your back to your side while in a flat bed without using bedrails?: A Little Help needed moving from lying on your back to sitting on the side of  a flat bed without using bedrails?: A Little Help needed moving to and from a bed to a chair (including a wheelchair)?: A Little Help needed standing up from a chair using your arms (e.g., wheelchair or bedside chair)?: A Little Help needed to walk in hospital room?: A Lot Help needed climbing 3-5 steps with a railing? : A Lot 6 Click Score: 16    End of Session Equipment Utilized During Treatment: Gait belt;Oxygen  Activity Tolerance: Patient tolerated treatment well Patient left: in bed;with call bell/phone within reach;with bed alarm set;with family/visitor present;Other (comment) (chaplain in room) Nurse  Communication: Mobility status PT Visit Diagnosis: Other abnormalities of gait and mobility (R26.89);Muscle weakness (generalized) (M62.81);Unsteadiness on feet (R26.81);History of falling (Z91.81)    Time: 1209-1229 PT Time Calculation (min) (ACUTE ONLY): 20 min   Charges:   PT Evaluation $PT Eval Moderate Complexity: 1 Mod   PT General Charges $$ ACUTE PT VISIT: 1 Visit         Tori Ariani Seier PT, DPT 07/14/24, 1:33 PM

## 2024-07-14 NOTE — Assessment & Plan Note (Signed)
 Developed L-sided CP last evening that was reproducible Unremarkable EKG Negative troponin x 2 Resolved

## 2024-07-14 NOTE — Assessment & Plan Note (Addendum)
 A1c 7.2, reasonable control Goal glucose is 100-200 per family Resume metformin Continue Tresiba and aspart insulin  Carb modified diet

## 2024-07-14 NOTE — Assessment & Plan Note (Signed)
 Recent echo (9/29) with preserved ER, grade 2 DD Appears compensated at this time

## 2024-07-14 NOTE — Assessment & Plan Note (Signed)
 Continue pregabalin , lidocaine  patch, and as-needed oxycodone 

## 2024-07-14 NOTE — Assessment & Plan Note (Addendum)
 I have discussed code status with the patient/family and they are in agreement that the patient would not desire resuscitation and would prefer to die a natural death should that situation arise. [facility] documents reviewed Patient will need a gold out of facility DNR form at the time of discharge - signed an on chart

## 2024-07-14 NOTE — Plan of Care (Signed)
   Problem: Coping: Goal: Ability to adjust to condition or change in health will improve Outcome: Progressing   Problem: Skin Integrity: Goal: Risk for impaired skin integrity will decrease Outcome: Progressing   Problem: Education: Goal: Knowledge of General Education information will improve Description: Including pain rating scale, medication(s)/side effects and non-pharmacologic comfort measures Outcome: Progressing

## 2024-07-14 NOTE — Telephone Encounter (Signed)
 Called daughter and  given appt on 11/21 at 3 pm. Daughter is aware of appt.

## 2024-07-14 NOTE — Assessment & Plan Note (Addendum)
 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 Lonn   She has only completed 2 courses of chemo thus far due to complications Dr. Lonn consulted today She is concerned that the patient will not be able to tolerate side effects and complications from treatment They will discuss in clinic next week In the meantime, patient/family are open to palliative care consultation and this is ordered for outpatient

## 2024-07-14 NOTE — Assessment & Plan Note (Addendum)
 Intermittent disorientation likely secondary to UTI  Reports some difficulty swallowing; was to have home PT/OT/SLP but they only came once and she hasn't heard back from them Appears to be improved at this time Per TOC, since she has Medicaid she us  unlikely to have significant home health visits Discussed palliative care vs. Hospice, will request palliative care consult for  now

## 2024-07-14 NOTE — Assessment & Plan Note (Signed)
 H/o DVT/PE Continue treatment-dose Lovenox 

## 2024-07-14 NOTE — TOC Transition Note (Addendum)
 Transition of Care [facility]) - Discharge Note   Patient Details  Name: Brenda [name] MRN: [id number] Date of Birth: 14-Mar-1961  Transition of [facility]) CM/SW Contact:  Toy LITTIE Agar, RN Phone Number:[phone]  07/14/2024, 2:57 PM   Clinical Narrative:    Patient to discharge home with family. HH to follow up with [facility]. MD has been notified to enter [facility] orders for resumption of services.    Final next level of care: Home w Home Health Services Barriers to Discharge: No Barriers Identified   Patient Goals and CMS Choice Patient states their goals for this hospitalization and ongoing recovery are:: Ready to go home CMS Medicare.gov Compare Post Acute Care list provided to:: Patient Choice offered to / list presented to : Patient, Spouse [place] ownership interest in [facility]provided to::  (n/a)    Discharge Placement                       Discharge Plan and Services Additional resources added to the After Visit Summary for   In-house Referral: NA Discharge Planning Services: CM Consult Post Acute Care Choice: Home Health          DME Arranged: N/A DME Agency: NA       HH Arranged: PT, OT HH Agency: Other - See comment (patient states that she was already in the process of starting [facility] but cant remember the name of agency. Will call CM back with name so CM can update agency on discharge)        Social Drivers of Health (SDOH) Interventions SDOH Screenings   Food Insecurity: No Food Insecurity (07/13/2024)  Recent Concern: Food Insecurity - Food Insecurity Present (04/20/2024)  Housing: Low Risk  (07/13/2024)  Transportation Needs: No Transportation Needs (07/13/2024)  Utilities: Not At Risk (07/13/2024)  Depression (PHQ2-9): Low Risk  (06/30/2024)  Recent Concern: Depression (PHQ2-9) - Medium Risk (05/07/2024)  Financial Resource Strain: High Risk (02/10/2021)  Social Connections: Moderately Integrated (07/13/2024)  Tobacco Use: Medium  Risk (07/13/2024)     Readmission Risk Interventions    07/14/2024    2:34 PM 05/04/2024   10:00 AM  Readmission Risk Prevention Plan  Transportation Screening Complete Complete  PCP or Specialist Appt within 3-5 Days  Complete  HRI or Home Care Consult  Complete  Social Work Consult for Recovery Care Planning/Counseling  Complete  Palliative Care Screening  Not Applicable  Medication Review Oceanographer) Complete Complete  PCP or Specialist appointment within 3-5 days of discharge Complete   HRI or Home Care Consult Complete   SW Recovery Care/Counseling Consult Complete   Palliative Care Screening Not Applicable   Skilled Nursing Facility Not Applicable

## 2024-07-14 NOTE — Discharge Summary (Addendum)
 Physician Discharge Summary   Patient: Brenda [name] MRN: [id number] DOB: 05-Aug-1961  Admit date:     07/12/2024  Discharge date: 07/14/24  Discharge Physician: Delon Herald   PCP: [facility]   Recommendations at discharge:   Complete antibiotics - instead of cephalexin  (Keflex ), take cefadroxil  twice daily x 7 days Follow up next week with Dr. Lonn to discuss cancer treatment  You are [facility] Follow up with PCP in 1-2 weeks You are recommended for an outpatient barium swallow study and will need to follow up as planned  Discharge Diagnoses: Principal Problem:   Acute UTI Active Problems:   Obstructive sleep apnea   Class 1 obesity due to excess calories with body mass index (BMI) of 32.0 to 32.9 in adult   Type 2 diabetes mellitus with complication, with long-term current use of insulin  (HCC)   Anxiety with depression   Chronic diastolic CHF (congestive heart failure) (HCC)   Chronic hypoxic respiratory failure (HCC)   Non-cardiac chest pain   Endometrial carcinoma (HCC)   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  She was given nitrofurantoin 2 days PTA for UTI.  No evidence of sepsis, started on Ceftriaxone .  Assessment and Plan:  Assessment & Plan Acute UTI Not septic on admission    Reports urinary symptoms at home as well as R-sided flank pain UA not overly suggestive of UTI, but she has been on recent antibiotics so suspect inadequately treated UTI Urine culture with >100k colonies GNR Blood cultures  NTD Ceftriaxone  -> Cefadroxil  for 7 more days (immunocompromised) Feeling better, wants to go home Will dc to PO antibiotics with plan to start nitrofurantoin for prophylaxis once complete (ordered previously) Endometrial carcinoma (HCC) S/p TAH and radiation at time of initial diagnosis in 2023 Recurrence in L psoas muscle, lung, and with LAD, stage 4 disease Undergoing treatment with carboplatin , paclitaxel , and pembrolizumab  under the care of Dr. Lonn   She has only completed 2 courses of chemo thus far due to complications Dr. Lonn consulted today She is concerned that the patient will not be able to tolerate side effects and complications from treatment They will discuss in clinic next week In the meantime, patient/family are open to palliative care consultation and this is ordered for outpatient Acute metabolic encephalopathy Intermittent disorientation likely secondary to UTI  Reports some difficulty swallowing; was to have home PT/OT/SLP but they only came once and she hasn't heard back from them Appears to be improved at this time Per TOC, since she has Medicaid she us  unlikely to have significant home health visits Discussed palliative care vs. Hospice, will request palliative care consult for  now Non-cardiac chest pain Developed L-sided CP last evening that was reproducible Unremarkable EKG Negative troponin x 2 Resolved Type 2 diabetes mellitus with complication, with long-term current use of insulin  (HCC) A1c 7.2, reasonable control Goal glucose is 100-200 per family Resume metformin Continue Tresiba and aspart insulin  Carb modified diet  Anxiety with depression Continue Prozac , Remeron, Haldol, and as-needed hydroxyzine  Chronic diastolic CHF (congestive heart failure) (HCC) Recent  echo (9/29) with preserved ER, grade 2 DD Appears compensated at this time Chronic hypoxic respiratory failure (HCC) Obstructive sleep apnea Continue supplemental O2 and CPAP while  sleeping  History of CAD (coronary artery disease) History of stroke Continue Lipitor   No ASA since she is on treatment-dose Lovenox   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 Continue treatment-dose Lovenox  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 [facility] documents reviewed Patient will need a gold out of facility DNR form at the time of discharge - signed an on chart    Consultants: Oncology OT SLP   Procedures: None   Antibiotics: Ceftriaxone  11/10-     Pain control - [place]  Controlled Substance Reporting System database was reviewed. and patient was instructed, not to drive, operate heavy machinery, perform activities at heights, swimming or participation in water activities or provide baby-sitting services while on Pain, Sleep and Anxiety Medications; until their outpatient Physician has advised to do so again. Also recommended to not to take more than prescribed Pain, Sleep and Anxiety Medications.   Disposition: Home Diet recommendation:  Cardiac and Carb modified diet DISCHARGE MEDICATION: Allergies as of 07/14/2024       Reactions   Hydrocodone Shortness Of Breath, Dermatitis, Other (See Comments)   I forget to breathe.   Clopidogrel Hives, Itching        Medication List     TAKE these medications     acetaminophen  500 MG tablet Commonly known as: TYLENOL  Take 1-2 tablets (500-1,000 mg total) by mouth every 8 (eight) hours as needed for mild pain (pain score 1-3) or moderate pain (pain score 4-6).   atorvastatin  80 MG tablet Commonly known as: LIPITOR  Take 1 tablet (80 mg total) by mouth every evening.   cefadroxil  500 MG capsule Commonly known as: DURICEF Take 1 capsule (500 mg total) by mouth 2 (two) times daily for 7 days.   enoxaparin  80 MG/0.8ML injection Commonly known as: LOVENOX  Inject 0.8 mLs (80 mg total) into the skin every 12 (twelve) hours.   FLUoxetine  40 MG capsule Commonly known as: PROZAC  Take 1 capsule (40 mg total) by mouth daily.   guaiFENesin  600 MG 12 hr tablet Commonly known as: MUCINEX  Take 1 tablet (600 mg total) by mouth 2 (two) times daily as needed for cough or to loosen phlegm.   haloperidol 1 MG tablet Commonly known as: HALDOL Take 2 tablets (2 mg total) by mouth 2 (two) times daily.   hydrOXYzine 10 MG tablet Commonly known as: ATARAX Take 1 tablet (10 mg total) by mouth 3 (three) times daily as needed for anxiety.   insulin  aspart 100 UNIT/ML FlexTouch Pen Commonly known as: FIASP  Inject 1-3 Units into the skin 3 (three) times daily with meals. Per sliding scale   insulin  degludec 100 UNIT/ML FlexTouch Pen Commonly known as: TRESIBA Inject 25 Units into the skin daily. What changed: how much to  take   lactulose  10 GM/15ML solution Commonly known as: CHRONULAC  Take 45 mLs (30 g total) by mouth 2 (two) times daily as needed for mild constipation or moderate constipation.   lidocaine  5 % Commonly known as: LIDODERM  Place 1 patch onto the skin daily. Placed on back below tumor   metFORMIN 500 MG 24 hr tablet Commonly known as: GLUCOPHAGE-XR Take 2 tablets (1,000 mg total) by mouth 2 (two) times daily with a meal.   mirtazapine 15 MG tablet Commonly known as: REMERON Take 1 tablet (15 mg total) by mouth at bedtime.   Misc.  Devices Misc Portable oxygen  concentrator, 2L oxygen .  Diagnoses-chronic respiratory failure with hypoxia   NEURIVA PO Take 1 tablet by mouth daily.   nitroGLYCERIN  0.4 MG SL tablet Commonly known as: NITROSTAT  Dissolve 1 tablet under the tongue every 5 minutes as needed for chest pain. Max of 3 doses, then 911.   nystatin cream Commonly known as: MYCOSTATIN Apply topically 2 (two) times daily. What changed:  when to take this reasons to take this   Oxycodone  HCl 10 MG Tabs Take 1 tablet (10 mg total) by mouth every 6 (six) hours as needed (pain).   polyethylene glycol powder 17 GM/SCOOP powder Commonly known as: GLYCOLAX /MIRALAX  Dissolve 1 capful (17g) in 4-8 ounces of liquid and take by mouth daily as needed for mild constipation.   pregabalin  50 MG capsule Commonly known as: LYRICA  Take 1 capsule (50 mg total) by mouth 3 (three) times daily.   prochlorperazine  10 MG tablet Commonly known as: COMPAZINE  Take 1 tablet (10 mg total) by mouth every 6 (six) hours as needed for nausea or vomiting.   Vitamin D3 Super Strength 50 MCG (2000 UT) Caps Generic drug: Cholecalciferol  Take 2,000 Units by mouth in the morning.        Discharge Exam:   Subjective: Feeling better.  No pain today.  She is sad after her discussion with Dr. Lonn.  She wants to go home, granddaughter is in agreement.   Objective: Vitals:   07/14/24 0431 07/14/24 1340  BP: (!) 161/82 137/69  Pulse: 93 74  Resp: 18 20  Temp: 97.7 F (36.5 C) 97.6 F (36.4 C)  SpO2: 97% 100%    Intake/Output Summary (Last 24 hours) at 07/14/2024 1409 Last data filed at 07/14/2024 9357 Gross per 24 hour  Intake 113 ml  Output --  Net 113 ml   Filed Weights   07/12/24 2300  Weight: 80.5 kg    Exam:  General:  Appears calm and comfortable and is in NAD Eyes:  normal lids, iris ENT:  grossly normal hearing, lips & tongue, mmm; +hirsuitism Cardiovascular:  RRR. No LE edema.  Respiratory:   CTA  bilaterally with no wheezes/rales/rhonchi.  Normal respiratory effort. Abdomen:  soft, NT, ND Skin:  no rash or induration seen on limited exam Musculoskeletal:  grossly normal tone BUE/BLE, good ROM, no bony abnormality Psychiatric:  blunted mood and affect, speech fluent and appropriate, AOx3 Neurologic:  CN 2-12 grossly intact, moves all extremities in coordinated fashion  Data Reviewed: I have reviewed the patient's lab results since admission.  Pertinent labs for today include:   Glucose 187, 177 HS troponin 17, 16 WBC 4.1 Hgb 8.9, stable Urine culture pending Blood cultures NTD    Condition at discharge: fair  The results of significant diagnostics from this hospitalization (including imaging, microbiology, ancillary and laboratory) are listed below for reference.   Imaging Studies: DG Chest [facility] 1 View Result  Date: 07/12/2024 EXAM: 1 VIEW(S) XRAY OF THE CHEST 07/12/2024 11:20:58 PM COMPARISON: 05/31/2024 CLINICAL HISTORY: Questionable sepsis - evaluate for abnormality FINDINGS: LINES, TUBES AND DEVICES: Right chest wall Port-A-Cath in place with tip at the superior cavoatrial junction. Coronary stenting noted. LUNGS AND PLEURA: Similar elevation of right hemidiaphragm. No focal pulmonary opacity. No pleural effusion. No pneumothorax. HEART AND MEDIASTINUM: Aortic atherosclerotic calcification. BONES AND SOFT TISSUES: No acute osseous abnormality. IMPRESSION: 1. No acute cardiopulmonary findings. Electronically signed by: Oneil Devonshire MD 07/12/2024 11:27 PM EST RP Workstation: [id number]   VAS US  LOWER EXTREMITY VENOUS (DVT) Result Date: 06/22/2024  Lower Venous DVT Study Patient Name:  Brenda [name]  Date of Exam:   06/22/2024 Medical Rec #: [id number]            Accession #:    [id number] Date of Birth: Oct 09, 1960            Patient Gender: F Patient Age:   17 years Exam Location:  [facility] Procedure:      VAS US  LOWER EXTREMITY VENOUS (DVT) Referring Phys: BURNARD  GRIFFITH --------------------------------------------------------------------------------  Indications: Pulmonary embolism. Other Indications: H/O CVA and cancer. Comparison Study: Previous study on 9.30.2025. Performing Technologist: Edilia Elden Appl  Examination Guidelines: A complete evaluation includes B-mode imaging, spectral Doppler, color Doppler, and power Doppler as needed of all accessible portions of each vessel. Bilateral testing is considered an integral part of a complete examination. Limited examinations for reoccurring indications may be performed as noted. The reflux portion of the exam is performed with the patient in reverse Trendelenburg.  +---------+---------------+---------+-----------+----------+--------------+ RIGHT    CompressibilityPhasicitySpontaneityPropertiesThrombus Aging +---------+---------------+---------+-----------+----------+--------------+ CFV      Full           Yes      Yes                                 +---------+---------------+---------+-----------+----------+--------------+ SFJ      Full           Yes      Yes                                 +---------+---------------+---------+-----------+----------+--------------+ FV Prox  Full                                                        +---------+---------------+---------+-----------+----------+--------------+ FV Mid   Full                                                        +---------+---------------+---------+-----------+----------+--------------+ FV DistalFull                                                        +---------+---------------+---------+-----------+----------+--------------+ PFV      Full                                                        +---------+---------------+---------+-----------+----------+--------------+  POP      Full           Yes      Yes                                  +---------+---------------+---------+-----------+----------+--------------+ PTV      None           Yes      Yes                                 +---------+---------------+---------+-----------+----------+--------------+ PERO     None           No       No                                  +---------+---------------+---------+-----------+----------+--------------+ Deep vein thrombosis noted in one of the paired posterior tibial veins and one of the paired peroneal veins.  +---------+---------------+---------+-----------+----------+--------------+ LEFT     CompressibilityPhasicitySpontaneityPropertiesThrombus Aging +---------+---------------+---------+-----------+----------+--------------+ CFV      Full           Yes      Yes                                 +---------+---------------+---------+-----------+----------+--------------+ SFJ      Full           Yes      Yes                                 +---------+---------------+---------+-----------+----------+--------------+ FV Prox  Full                                                        +---------+---------------+---------+-----------+----------+--------------+ FV Mid   Full                                                        +---------+---------------+---------+-----------+----------+--------------+ FV DistalFull                                                        +---------+---------------+---------+-----------+----------+--------------+ PFV      Full                                                        +---------+---------------+---------+-----------+----------+--------------+ POP      Full           Yes      Yes                                 +---------+---------------+---------+-----------+----------+--------------+  PTV      Full                                                        +---------+---------------+---------+-----------+----------+--------------+ PERO     Full                                                         +---------+---------------+---------+-----------+----------+--------------+     Summary: RIGHT: - Findings consistent with acute deep vein thrombosis involving the right posterior tibial veins, and right peroneal veins.  - No cystic structure found in the popliteal fossa.  LEFT: - There is no evidence of deep vein thrombosis in the lower extremity.  - No cystic structure found in the popliteal fossa.  *See table(s) above for measurements and observations. Electronically signed by Fonda Rim on 06/22/2024 at 2:37:49 PM.    Final    EEG adult Result Date: 06/21/2024 Shelton Arlin KIDD, MD     06/21/2024  9:56 AM Patient Name: Brenda [name] MRN: [id number] Epilepsy Attending: Arlin KIDD Shelton Referring Physician/Provider: Sherlon Brayton RAMAN, MD Date: 06/21/2024 Duration: 22.24 mins Patient history: 63yo F with ams. EEG to evaluate for seizure Level of alertness: Awake AEDs during EEG study: None Technical aspects: This EEG study was done with scalp electrodes positioned according to the 10-20 International system of electrode placement. Electrical activity was reviewed with band pass filter of 1-70Hz , sensitivity of 7 uV/mm, display speed of 10mm/sec with a 60Hz  notched filter applied as appropriate. EEG data were recorded continuously and digitally stored.  Video monitoring was available and reviewed as appropriate. Description: The posterior dominant rhythm consists of 8-9 Hz activity of moderate voltage (25-35 uV) seen predominantly in posterior head regions, symmetric and reactive to eye opening and eye closing. EEG showed intermittent generalized 5 to 6 Hz theta slowing. Hyperventilation and photic stimulation were not performed.   ABNORMALITY - Intermittent slow, generalized IMPRESSION: This study is suggestive of mild generalized non specific cerebral dysfunction (encephalopathy). No seizures or epileptiform discharges were seen throughout  the recording. Arlin KIDD Shelton   MR BRAIN WO CONTRAST Result Date: 06/20/2024 EXAM: MRI BRAIN WITHOUT CONTRAST 06/20/2024 11:36:24 PM TECHNIQUE: Multiplanar multisequence MRI of the head/brain was performed without the administration of intravenous contrast. COMPARISON: Head CT from 06/19/2024 as well as recent MRI from 05/31/2024. CLINICAL HISTORY: Mental status change, unknown cause; recent embolic stroke, with worsening mental state. FINDINGS: BRAIN AND VENTRICLES: There has been interval evolution of previously identified embolic infarcts involving the anterior and posterior circulation now subacute in appearance. Persistent diffusion signal abnormality about several of these infarcts, while several others have resolved since prior. Associated T2/FLAIR signal abnormality about the subacute right PCA territory infarct without significant regional mass effect or edema. No associated hemorrhage. No new areas of acute or interval infarction. No mass. No midline shift. No hydrocephalus. The sella is unremarkable. Normal flow voids. ORBITS: No acute abnormality. SINUSES AND MASTOIDS: Small bilateral mastoid effusions noted. Nasopharynx unremarkable. BONES AND SOFT TISSUES: Normal marrow signal. No acute soft tissue abnormality. IMPRESSION: 1. Interval evolution of previously identified embolic infarcts involving the anterior and posterior circulations, now subacute in appearance.  No significant associated mass effect or evidence for hemorrhagic transformation. 2. No new acute or interval infarction. 3. No other acute intracranial abnormality. Electronically signed by: Morene Hoard MD 06/20/2024 11:49 PM EDT RP Workstation: [id number]   CT ABDOMEN PELVIS W CONTRAST Result Date: 06/19/2024 CLINICAL DATA:  Abdominal pain, acute, nonlocalized EXAM: CT ABDOMEN AND PELVIS WITH CONTRAST TECHNIQUE: Multidetector CT imaging of the abdomen and pelvis was performed using the standard protocol following bolus  administration of intravenous contrast. RADIATION DOSE REDUCTION: This exam was performed according to the departmental dose-optimization program which includes automated exposure control, adjustment of the mA and/or kV according to patient size and/or use of iterative reconstruction technique. CONTRAST:  75mL OMNIPAQUE  IOHEXOL  350 MG/ML SOLN COMPARISON:  05/04/2024 FINDINGS: Lower chest: No focal airspace consolidation or pleural effusion.Incidentally noted, proximal segmental pulmonary emboli in the right lower lobe. Dense atherosclerosis and possible stenting of the LAD. Hepatobiliary: No mass.Cholecystectomy. Mild dilation of the intrahepatic and extrahepatic bile ducts, likely related to the prior cholecystectomy. The portal veins are patent. Pancreas: Diffuse fatty atrophy of the pancreatic parenchyma. No mass or ductal dilation. No peripancreatic inflammation or fluid collection. Spleen: Wedge-shaped region of hypoattenuation in the posteromedial aspect of the spleen, measuring up to 1.7 cm, likely splenic infarct. Adrenals/Urinary Tract: No adrenal masses. Similar mass effect and anterior displacement of the left kidney. No renal mass. No nephrolithiasis or hydronephrosis. Partially distended urinary bladder without visualized abnormality. Stomach/Bowel: The stomach is decompressed without focal abnormality. 2.1 cm periampullary duodenum diverticulum. No small bowel wall thickening or inflammation. No small bowel obstruction.Normal appendix. Vascular/Lymphatic: No aortic aneurysm. Scattered aortoiliac atherosclerosis. Subcentimeter paraesophageal lymph node along the right ward aspect of the distal esophagus measuring 7 mm, unchanged. In the posteromedial, left pararenal space, there is a large centrally necrotic mass with peripheral enhancement measuring 8.5 x 7.5 x 10.2 cm (APxTRxCC), mostly centered within the left psoas musculature, causing significant mass effect on the posteromedial aspect of the left  kidney. The mass at least abuts the posterolateral wall of the infrarenal aorta and the left renal collecting system at the UPJ and proximal ureteral segment. Subtle erosive changes of the leftward anterior aspect of the L2 vertebral body (axial 46), unchanged. Interval decrease in size of the left external iliac chain lymph node (axial 73), now measuring 0.8 cm (previously 1.8 cm). Reproductive: Hysterectomy. No concerning adnexal mass.No free pelvic fluid. Other: No pneumoperitoneum or ascites.  Mild anasarca. Musculoskeletal: No acute fracture or destructive lesion. Multilevel degenerative disc disease of the spine. IMPRESSION: 1. Incidentally noted proximal segmental pulmonary emboli in the right lower lobe. No findings to suggest right heart strain. 2. Small wedge-shaped region of hypoattenuation in the posteromedial aspect of the spleen measuring up to 1.7 cm, consistent with a splenic infarct. 3. Overall, similar size and appearance of the centrally necrotic, infiltrative mass in the left psoas muscle, measuring 8.5 x 7.5 x 10.2 cm. The mass abuts the left renal collecting system, without associated hydronephrosis. 4. Decrease in size of the left external iliac chain lymph node (axial 73), now measuring 0.8 cm (previously, 1.8 cm). Critical Value/emergent results were called by telephone at the time of interpretation on 06/19/2024 at 4:09 pm to provider [facility], who verbally acknowledged these results. Aortic Atherosclerosis (ICD10-I70.0). Electronically Signed   By: Rogelia Myers M.D.   On: 06/19/2024 16:15   CT Head Wo Contrast Result Date: 06/19/2024 CLINICAL DATA:  Recent stroke, increased confusion EXAM: CT HEAD WITHOUT CONTRAST TECHNIQUE: Contiguous axial images  were obtained from the base of the skull through the vertex without intravenous contrast. RADIATION DOSE REDUCTION: This exam was performed according to the departmental dose-optimization program which includes automated  exposure control, adjustment of the mA and/or kV according to patient size and/or use of iterative reconstruction technique. COMPARISON:  MRI 05/31/2024 FINDINGS: CT HEAD: There is an evolving infarct in the right posterior and medial temporal lobe with some cortical mineralization. There is no hemorrhage. No new ischemic changes. No mass lesion. The ventricles are normal. Skull/sinuses/orbits: No significant abnormality. IMPRESSION: Evolving infarct in the right posterior and medial temporal lobe No new infarct. Electronically Signed   By: Nancyann Burns M.D.   On: 06/19/2024 16:00    Microbiology: Results for orders placed or performed during the hospital encounter of 07/12/24  Blood Culture (routine x 2)     Status: None (Preliminary result)   Collection Time: 07/12/24 11:05 PM   Specimen: BLOOD  Result Value Ref Range Status   Specimen Description BLOOD LEFT ANTECUBITAL  Final   Special Requests   Final    BOTTLES DRAWN AEROBIC AND ANAEROBIC Blood Culture adequate volume   Culture   Final    NO GROWTH 2 DAYS Performed at [facility], [address], [place], [place] [address]    Report Status PENDING  Incomplete  Urine Culture     Status: Abnormal (Preliminary result)   Collection Time: 07/12/24 11:14 PM   Specimen: Urine, Random  Result Value Ref Range Status   Specimen Description URINE, RANDOM  Final   Special Requests NONE Reflexed from [id number]  Final   Culture (A)  Final    >=100,000 COLONIES/mL GRAM NEGATIVE RODS SUSCEPTIBILITIES TO FOLLOW Performed at [facility], [address] [place], [place] [address]    Report Status PENDING  Incomplete  Blood Culture (routine x 2)     Status: None (Preliminary result)   Collection Time: 07/12/24 11:41 PM   Specimen: BLOOD  Result Value Ref Range Status   Specimen Description BLOOD RIGHT ANTECUBITAL  Final   Special Requests   Final    BOTTLES DRAWN AEROBIC AND ANAEROBIC Blood Culture results may not be optimal due to an  inadequate volume of blood received in culture bottles   Culture   Final    NO GROWTH 1 DAY Performed at [facility], [address], [place], [place] [address]    Report Status PENDING  Incomplete    Labs: CBC: Recent Labs  Lab 07/12/24 2313 07/13/24 0733 07/14/24 0450  WBC 4.0 3.7* 4.1  NEUTROABS 2.3  --   --   HGB 9.3* 8.7* 8.9*  HCT 30.1* 29.3* 28.5*  MCV 86.0 86.9 85.3  PLT 258 258 259   Basic Metabolic Panel: Recent Labs  Lab 07/12/24 2313 07/13/24 0733 07/14/24 0450  NA 134* 138 138  K 4.3 4.1 4.2  CL 100 103 102  CO2 22 23 26   GLUCOSE 183* 93 187*  BUN 9 9 8   CREATININE 0.54 0.49 0.48  CALCIUM  9.1 9.3 9.2   Liver Function Tests: Recent Labs  Lab 07/12/24 2313  AST 24  ALT 24  ALKPHOS 70  BILITOT 0.2  PROT 6.8  ALBUMIN 3.0*   CBG: Recent Labs  Lab 07/13/24 1527 07/13/24 1610 07/13/24 2134 07/14/24 0727 07/14/24 1127  GLUCAP 257* 266* 239* 177* 225*    Discharge time spent: greater than 30 minutes.  Signed: Delon Herald, MD [facility] 07/14/2024

## 2024-07-14 NOTE — Evaluation (Signed)
 Occupational Therapy Evaluation Patient Details Name: Brenda [name] MRN: [id number] DOB: 03/29/1961 Today's Date: 07/14/2024   History of Present Illness   Brenda [name] is a 63 yo female who presented on 11/9 with urinary symptoms and AMS.  She was given nitrofurantoin 2 days PTA for UTI.  No evidence of sepsis, started on Ceftriaxone . Was having HH services for therapy PMH: HTN, HLD, DM, depression/anxiety, CVA (CIR recently), DVT/PE, OSA on CPAP, and metastatic endometrial cancer with chronic cancer-related pain     Clinical Impressions PTA, patient lives at home with family who has been assisting with rollator use, BADL's for showers, all IADL's and community access in w/c with husband assisting on ramp (in ill repair as per patient). On 2 ltrs baseline O2. Reports having difficulty accessing bathroom with rollator, was not issued a w/c cushion for w/c, and bed without a rail.  Patient recently d/c'd from CIR. Currently, patient presents with deficits outlined below (see OT Problem List for details) most significantly pain, visual, perceptual and cognitive deficits, assist for self feeding and swallowing issues, decreased activity tolerance, balance and generalized muscle strength deficits limiting BADL's (modA LB) and functional mobility (min A SPTs) performance.  Patient requires continued Acute care hospital level OT services to progress safety and functional performance and allow for discharge. Would benefit from [facility] services if aligned with patient family and MD care planning, a bed rail, w/c cushion and caregiver support and assist upon hospital discharge.       If plan is discharge home, recommend the following:   A lot of help with walking and/or transfers;A lot of help with bathing/dressing/bathroom;Assistance with cooking/housework;Assistance with feeding;Direct supervision/assist for medications management;Direct supervision/assist for financial management;Assist for  transportation;Help with stairs or ramp for entrance;Supervision due to cognitive status     Functional Status Assessment   Patient has had a recent decline in their functional status and demonstrates the ability to make significant improvements in function in a reasonable and predictable amount of time.     Equipment Recommendations   Wheelchair cushion (measurements OT);Other (comment) (front wheeled RW and bed rail)      Precautions/Restrictions   Precautions Precautions: Fall Recall of Precautions/Restrictions: Intact Precaution/Restrictions Comments: low vision (L eye blind) Restrictions Weight Bearing Restrictions Per Provider Order: No     Mobility Bed Mobility Overal bed mobility:  (was in recliner and remianed post session)                  Transfers Overall transfer level: Needs assistance Equipment used: Rolling walker (2 wheels) Transfers: Sit to/from Stand, Bed to chair/wheelchair/BSC Sit to Stand: Min assist     Step pivot transfers: Min assist     General transfer comment: cues and facilitation for obstacle management and scanning as well as hand placement for STS and SPT      Balance Overall balance assessment: Needs assistance Sitting-balance support: Feet supported Sitting balance-Leahy Scale: Good     Standing balance support: Reliant on assistive device for balance Standing balance-Leahy Scale: Poor Standing balance comment: steadying assist initially                           ADL either performed or assessed with clinical judgement   ADL Overall ADL's : Needs assistance/impaired Eating/Feeding: Set up;Sitting;Cueing for compensatory techinques;Supervision/ safety (having issues with swallowing meds and needing to cut food into small bites, ST evaluation pending)   Grooming: Wash/dry hands;Wash/dry face;Oral care;Set up;Cueing for sequencing;Sitting  Upper Body Bathing: Contact guard assist;Sitting   Lower Body  Bathing: Moderate assistance;Sit to/from stand   Upper Body Dressing : Contact guard assist;Sitting   Lower Body Dressing: Moderate assistance;Sit to/from stand Lower Body Dressing Details (indicate cue type and reason): cues to ensure mesh underwear and pad up completely with cues for figure 4 techniques to don over feet and use of reacher (has at home) Toilet Transfer: Minimal assistance;Cueing for sequencing;Stand-pivot;BSC/3in1;Rolling walker (2 wheels) Toilet Transfer Details (indicate cue type and reason): min cues Toileting- Clothing Manipulation and Hygiene: Minimal assistance;Sit to/from stand       Functional mobility during ADLs: Minimal assistance;Rolling walker (2 wheels) (in room mobility for [facility] and ADL's) General ADL Comments: educated on pacing and ECT principles with handout issued to family     Vision Baseline Vision/History: 1 Wears glasses Ability to See in Adequate Light: 2 Moderately impaired Patient Visual Report: Blurring of vision;Peripheral vision impairment;Other (comment) (L eye VFD and legal blindness, R eye sees light/images, but blurriness) Vision Assessment?: Wears glasses for reading Visual Fields: Left visual field deficit     Perception Perception: Impaired Preception Impairment Details: Spatial orientation Perception-Other Comments: L sided VFD   Praxis Praxis: WFL       Pertinent Vitals/Pain Pain Assessment Pain Assessment: Faces Faces Pain Scale: Hurts little more Breathing: normal Negative Vocalization: none Facial Expression: sad, frightened, frown Body Language: tense, distressed pacing, fidgeting Consolability: no need to console PAINAD Score: 2 Pain Location: back and neck Pain Descriptors / Indicators: Aching, Constant, Discomfort, Dull Pain Intervention(s): Monitored during session, Repositioned, Relaxation, Other (comment) (issued neck pillow support)     Extremity/Trunk Assessment Upper Extremity Assessment Upper Extremity  Assessment: Right hand dominant;Generalized weakness   Lower Extremity Assessment Lower Extremity Assessment: Defer to PT evaluation   Cervical / Trunk Assessment Cervical / Trunk Assessment: Normal   Communication Communication Communication: Impaired Factors Affecting Communication: Difficulty expressing self (intermittently)   Cognition Arousal: Alert Behavior During Therapy: Flat affect Cognition: Cognition impaired   Orientation impairments: Time, Situation Awareness: Intellectual awareness impaired Memory impairment (select all impairments): Short-term memory, Working memory Attention impairment (select first level of impairment): Sustained attention Executive functioning impairment (select all impairments): Reasoning, Problem solving, Sequencing OT - Cognition Comments: delayed processing, decreased initiation                 Following commands: Impaired Following commands impaired: Follows multi-step commands with increased time     Cueing  General Comments   Cueing Techniques: Verbal cues;Tactile cues  SpO2 on 2 ltrs 98%, issued neck pillow for comfort, daughter present in room, increased fatigue overall reported this am but improved from admission as per patient and family report   Exercises Exercises: Other exercises (breathing ex on ECT handouts and foam grasp cube issued for B hand use 10 reps q hr)        Home Living Family/patient expects to be discharged to:: Private residence Living Arrangements: Spouse/significant other Available Help at Discharge: Family Type of Home: House Home Access: Ramped entrance     Home Layout: One level     Bathroom Shower/Tub: Chief Strategy Officer: Standard Bathroom Accessibility: Yes (rollator with some difficulty) How Accessible: Accessible via walker Home Equipment: Rollator (4 wheels);BSC/3in1;Shower seat;Grab bars - toilet;Grab bars - tub/shower;Wheelchair - manual;Hand held shower  head;Adaptive equipment Adaptive Equipment: Reacher Additional Comments: baseline 2 ltrs O2, use of rollator in home, w/c for MD appts, ramp not sufficient as husband having difficulty with mngt, baseline  2 ltrs O2, was recieving HH therapy services and was recently d/c'd CIR  Lives With: Family;Spouse (spouse does not live in home but assists and stays when son at school and dtr at her own home with her family)    Prior Functioning/Environment Prior Level of Function : Needs assist;History of Falls (last six months)             Mobility Comments: use of rollator in home, w/c for community access with assist, having issues on ramp and for bed mobility as well as w/c and rollator access into bathroom ADLs Comments: relatively mod I prior for BADL's except showers and assist with all IADL's from family    OT Problem List: Decreased strength;Decreased activity tolerance;Impaired balance (sitting and/or standing);Impaired vision/perception;Decreased coordination;Decreased cognition;Decreased safety awareness;Decreased knowledge of use of DME or AE;Decreased knowledge of precautions;Cardiopulmonary status limiting activity;Impaired UE functional use;Pain   OT Treatment/Interventions: Self-care/ADL training;Therapeutic exercise;DME and/or AE instruction;Therapeutic activities;Patient/family education;Balance training;Cognitive remediation/compensation;Energy conservation;Visual/perceptual remediation/compensation      OT Goals(Current goals can be found in the care plan section)   Acute Rehab OT Goals Patient Stated Goal: to feel better OT Goal Formulation: With patient/family Time For Goal Achievement: 07/28/24 Potential to Achieve Goals: Fair ADL Goals Pt Will Perform Lower Body Bathing: with contact guard assist;sit to/from stand Pt Will Perform Lower Body Dressing: with contact guard assist;sitting/lateral leans;with adaptive equipment Pt Will Transfer to Toilet: with  supervision;bedside commode;ambulating Pt Will Perform Toileting - Clothing Manipulation and hygiene: with contact guard assist;sit to/from stand Pt/caregiver will Perform Home Exercise Program: Increased strength;Both right and left upper extremity;With written HEP provided;With Supervision   OT Frequency:  Min 2X/week       AM-PAC OT 6 Clicks Daily Activity     Outcome Measure Help from another person eating meals?: A Little Help from another person taking care of personal grooming?: A Little Help from another person toileting, which includes using toliet, bedpan, or urinal?: A Lot Help from another person bathing (including washing, rinsing, drying)?: A Lot Help from another person to put on and taking off regular upper body clothing?: A Little Help from another person to put on and taking off regular lower body clothing?: A Lot 6 Click Score: 15   End of Session Equipment Utilized During Treatment: Gait belt;Rolling walker (2 wheels);Oxygen  Nurse Communication: Mobility status;Other (comment) (flowsheets updated with voiding info)  Activity Tolerance: Patient limited by fatigue Patient left: in chair;with call bell/phone within reach;with chair alarm set;with family/visitor present  OT Visit Diagnosis: Unsteadiness on feet (R26.81);Other abnormalities of gait and mobility (R26.89);History of falling (Z91.81);Muscle weakness (generalized) (M62.81);Low vision, both eyes (H54.2);Feeding difficulties (R63.3);Cognitive communication deficit (R41.841);Other symptoms and signs involving the nervous system (R29.898);Pain Pain - part of body:  (neck and back)                Time: 9044-8971 OT Time Calculation (min): 33 min Charges:  OT General Charges $OT Visit: 1 Visit OT Evaluation $OT Eval Low Complexity: 1 Low OT Treatments $Self Care/Home Management : 8-22 mins  Kvion Shapley OT/L Acute Rehabilitation Department  [phone]  07/14/2024, 11:46 AM

## 2024-07-14 NOTE — TOC Initial Note (Signed)
 Transition of Care [facility]) - Initial/Assessment Note    Patient Details  Name: Brenda [name] MRN: [id number] Date of Birth: 1961-06-09  [facility] CM/SW Contact:    Toy LITTIE Agar, RN Phone Number:[phone]  07/14/2024, 2:53 PM  Clinical Narrative:                 CM consulted for patient discharging with [facility] needs. Patient is from home with spouse. Patient initially was unsure of who HH was set up with. CM has verified with Burnard at [facility] that patient is active and [facility] services will resume once patient discharges. Patient has been updated and is ok with the plan. There are no DME needs noted.   Expected Discharge Plan: Home w Home Health Services Barriers to Discharge: No Barriers Identified   Patient Goals and CMS Choice Patient states their goals for this hospitalization and ongoing recovery are:: Ready to go home CMS Medicare.gov Compare Post Acute Care list provided to:: Patient Choice offered to / list presented to : Patient, Spouse [place] ownership interest in [facility].provided to::  (n/a)    Expected Discharge Plan and Services In-house Referral: NA Discharge Planning Services: CM Consult Post Acute Care Choice: Home Health Living arrangements for the past 2 months: Single Family Home Expected Discharge Date: 07/14/24               DME Arranged: N/A DME Agency: NA       HH Arranged: PT, OT HH Agency: Other - See comment (patient states that she was already in the process of starting HH but cant remember the name of agency. Will call CM back with name so CM can update agency on discharge)        Prior Living Arrangements/Services Living arrangements for the past 2 months: Single Family Home Lives with:: Spouse Patient language and need for interpreter reviewed:: Yes Do you feel safe going back to the place where you live?: Yes      Need for Family Participation in Patient Care: Yes (Comment) Care giver support system in  place?: Yes (comment) Current home services: DME (rollator/ shower seat/ cane/ CPAP/ oxygen  with [employer]) Criminal Activity/Legal Involvement Pertinent to Current Situation/Hospitalization: No - Comment as needed  Activities of Daily Living   ADL Screening (condition at time of admission) Independently performs ADLs?: Yes (appropriate for developmental age) Is the patient deaf or have difficulty hearing?: No Does the patient have difficulty seeing, even when wearing glasses/contacts?: No Does the patient have difficulty concentrating, remembering, or making decisions?: No  Permission Sought/Granted Permission sought to share information with : Family Supports Permission granted to share information with : Yes, Verbal Permission Granted  Share Information with NAME: Winnifred Dufford   [phone]     Permission granted to share info w Relationship: spouse  Permission granted to share info w Contact Information: [phone]  Emotional Assessment Appearance:: Appears stated age Attitude/Demeanor/Rapport: Gracious Affect (typically observed): Pleasant Orientation: : Oriented to Self, Oriented to Place, Oriented to  Time, Oriented to Situation Alcohol / Substance Use: Not Applicable Psych Involvement: No (comment)  Admission diagnosis:  Acute UTI [N39.0] Patient Active Problem List   Diagnosis Date Noted   History of stroke 07/13/2024   Acute UTI 07/13/2024   History of thromboembolism 07/13/2024   DNR (do not resuscitate) 07/13/2024   Acute metabolic encephalopathy 06/19/2024   Illness 06/18/2024   Encounter for medication review and counseling 06/18/2024   Adjustment disorder with anxiety 06/08/2024  CVA (cerebral vascular accident) (HCC) 06/03/2024   Acute ischemic stroke (HCC) 05/31/2024   Brain lesion 05/31/2024   DKA (diabetic ketoacidosis) (HCC) 05/21/2024   Physical debility 05/12/2024   Metastatic malignant neoplasm (HCC) 05/05/2024   Community acquired pneumonia  05/04/2024   History of CAD (coronary artery disease) 05/04/2024   CAP (community acquired pneumonia) 05/04/2024   Neoplasm of vertebral column 04/27/2024   Stroke-like symptoms 04/21/2024   Acute focal neurological deficit 04/20/2024   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 04/16/2024   Goals of care, counseling/discussion 04/16/2024   Other constipation 04/16/2024   Loss of appetite 04/06/2024   Intractable low back pain 03/29/2024   Pain of left thigh 03/03/2024   Rectal hemorrhage 03/03/2024   Contact dermatitis due to plant 01/30/2024   Moderate recurrent major depression (HCC) 12/24/2023   Counseling for HPV (human papillomavirus) vaccination 11/28/2023   Diabetic retinopathy associated with type 1 diabetes mellitus (HCC) 11/11/2023   Hyperglycemia due to type 2 diabetes mellitus (HCC) 11/11/2023   Cobalamin deficiency 10/28/2023   Long term current use of oral hypoglycemic drug 09/30/2023   Polyneuropathy due to type 2 diabetes mellitus (HCC) 09/30/2023   Recurrent falls 09/30/2023   Oxygen  dependent 09/17/2023   Glycosuria 07/29/2023   Health maintenance alteration 07/25/2023   Vitamin D deficiency 07/25/2023   Iron deficiency 07/22/2023   Long-term current use of proton pump inhibitor therapy 07/22/2023   Hypokalemia 04/26/2023   Hypomagnesemia 04/26/2023   Neuropathy 04/19/2023   Follow-up examination following treatment with high-risk medication 04/11/2023   Acute COVID-19 04/10/2023   Candidiasis of skin 02/15/2023   Thyroid  nodule 12/14/2022   Impacted cerumen of right ear 11/16/2022   Need for prophylactic vaccination with combined diphtheria-tetanus-pertussis (DTP) vaccine 11/16/2022   Health education given 10/30/2022   Physical deconditioning 10/15/2022   Diabetic neuropathy (HCC) 08/17/2022   Dysuria 08/17/2022   Ingrown toenail of right foot 08/17/2022   Generalized ischemic myocardial dysfunction 06/07/2022   Polycystic ovary syndrome  05/17/2022   Status post coronary artery bypass graft 05/16/2022   Abnormal urinalysis 03/02/2022   History of malignant neoplasm of uterine body 02/09/2022   Small bowel obstruction (HCC) 12/09/2021   SBO (small bowel obstruction) (HCC) 12/08/2021   Endometrial carcinoma (HCC) 12/06/2021   Non-cardiac chest pain 10/19/2021   Dysfunctional uterine bleeding    Anemia    Chronic hypoxic respiratory failure (HCC) 03/22/2021   (HFpEF) heart failure with preserved ejection fraction (HCC) 02/09/2021   Chronic diastolic CHF (congestive heart failure) (HCC) 02/08/2021   Old MI (myocardial infarction) 10/26/2019   Anxiety with depression 03/28/2016   Type 2 diabetes mellitus with complication, with long-term current use of insulin  (HCC) 02/23/2015   Class 1 obesity due to excess calories with body mass index (BMI) of 32.0 to 32.9 in adult 02/22/2015   Dyspnea 03/11/2013   Hyperlipidemia 05/25/2009   CAD (coronary artery disease) 05/25/2009   Essential hypertension 05/24/2009   Obstructive sleep apnea 05/24/2009   PCP:  [facility] Pharmacy:   [facility] [address] [place] [address] Phone: [phone] Fax: [phone]  [facility] [address] [place] [address] Phone: [phone] Fax: [phone]  [facility] [place] [address] [place], [place] - [address] [place] [address] Phone: [phone] Fax: [phone]     Social Drivers of Health (SDOH) Social History: SDOH Screenings   Food Insecurity: No  Food Insecurity (07/13/2024)  Recent Concern: Food Insecurity - Food Insecurity Present (04/20/2024)  Housing: Low Risk  (07/13/2024)  Transportation Needs: No Transportation Needs (07/13/2024)  Utilities: Not At Risk (07/13/2024)  Depression (PHQ2-9): Low Risk  (06/30/2024)  Recent Concern: Depression (PHQ2-9) - Medium Risk  (05/07/2024)  Financial Resource Strain: High Risk (02/10/2021)  Social Connections: Moderately Integrated (07/13/2024)  Tobacco Use: Medium Risk (07/13/2024)   SDOH Interventions:     Readmission Risk Interventions    07/14/2024    2:34 PM 05/04/2024   10:00 AM  Readmission Risk Prevention Plan  Transportation Screening Complete Complete  PCP or Specialist Appt within 3-5 Days  Complete  HRI or Home Care Consult  Complete  Social Work Consult for Recovery Care Planning/Counseling  Complete  Palliative Care Screening  Not Applicable  Medication Review Oceanographer) Complete Complete  PCP or Specialist appointment within 3-5 days of discharge Complete   HRI or Home Care Consult Complete   SW Recovery Care/Counseling Consult Complete   Palliative Care Screening Not Applicable   Skilled Nursing Facility Not Applicable

## 2024-07-14 NOTE — Progress Notes (Signed)
 Brenda [name]   DOB:02/03/1961   FM#:[id number]    ASSESSMENT & PLAN:  Endometrial carcinoma [facility]) The patient was originally diagnosed with uterine cancer in 2023, status post surgery and adjuvant radiation treatment She was noted to have cancer recurrence in July 2025 when she presented with severe back pain with CT imaging showing recurrent disease in the retroperitoneal lymph node.  CT imaging of the chest shows spiculated lung mass suspicious for metastatic spread Pathology: Left retroperitoneal mass biopsy confirmed adenocarcinoma, ER positive, p53 wild-type.   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, ER 100% positive, PD-L1 75% positive, PIK 3 CA mutated, PTEN mutated, BRCA 1 mutation not detected, BRCA2, variant of unknown significance, HER2/neu 1+, PR 2%   She was started on reduced dose combination treatment with carboplatin , paclitaxel  and pembrolizumab  end of August Between August to November, she has numerous hospitalizations for altered mental status, uncontrolled diabetes and others She was recently discharged from the hospital and was diagnosed with DVT and is anticoagulated She is now readmitted to the hospital with another urinary tract infection I have a frank and honest discussion with the patient and family It is not clear to me she can truly tolerate side effects and complication from treatment I will set up outpatient follow-up next week to discuss goals of care and consideration to change her future treatment  Type 2 diabetes mellitus with complication, with long-term current use of insulin  [facility]) She has difficult to control diabetes She will continue insulin  therapy as directed  Multifactorial anemia Likely due to recent treatment She is asymptomatic, monitor only  Cancer related pain Her pain is well-controlled  Altered mental status, urinary tract  infection Appears to be responding well to antibiotics and hopefully will be discharged soon  Discharge planning Will defer to primary service As soon as she is discharged, I will arrange outpatient follow-up next week to discuss future goals of care  Brenda Bedford, MD 07/14/2024 9:39 AM  Subjective:  I was notified of her admission.  The patient was noted to have altered mental status, leading to yet another admission.  Urine culture was positive for E. coli and she appears to be responding well to IV antibiotics.  Family by the bedside.  She described hallucinations that prompted family members to bring her to the ER to be admitted She is eating breakfast.  Appears alert and oriented  Objective:  Vitals:   07/13/24 2019 07/14/24 0431  BP: (!) 169/66 (!) 161/82  Pulse: 86 93  Resp: 18 18  Temp: 97.9 F (36.6 C) 97.7 F (36.5 C)  SpO2: 100% 97%     Intake/Output Summary (Last 24 hours) at 07/14/2024 0939 Last data filed at 07/14/2024 9357 Gross per 24 hour  Intake 353 ml  Output --  Net 353 ml

## 2024-07-15 ENCOUNTER — Telehealth: Payer: Self-pay | Admitting: Neurology

## 2024-07-15 ENCOUNTER — Telehealth: Payer: Self-pay | Admitting: Licensed Clinical Social Worker

## 2024-07-15 ENCOUNTER — Inpatient Hospital Stay (HOSPITAL_COMMUNITY): Admission: EM | Admit: 2024-07-15 | Source: Home / Self Care | Attending: Family Medicine | Admitting: Family Medicine

## 2024-07-15 ENCOUNTER — Encounter

## 2024-07-15 ENCOUNTER — Telehealth: Payer: Self-pay | Admitting: *Deleted

## 2024-07-15 ENCOUNTER — Other Ambulatory Visit: Payer: Self-pay | Admitting: Neurology

## 2024-07-15 DIAGNOSIS — D649 Anemia, unspecified: Secondary | ICD-10-CM

## 2024-07-15 DIAGNOSIS — J9611 Chronic respiratory failure with hypoxia: Secondary | ICD-10-CM | POA: Diagnosis present

## 2024-07-15 DIAGNOSIS — K625 Hemorrhage of anus and rectum: Secondary | ICD-10-CM

## 2024-07-15 DIAGNOSIS — E440 Moderate protein-calorie malnutrition: Secondary | ICD-10-CM | POA: Insufficient documentation

## 2024-07-15 DIAGNOSIS — I2510 Atherosclerotic heart disease of native coronary artery without angina pectoris: Secondary | ICD-10-CM | POA: Diagnosis present

## 2024-07-15 DIAGNOSIS — G893 Neoplasm related pain (acute) (chronic): Secondary | ICD-10-CM | POA: Diagnosis present

## 2024-07-15 DIAGNOSIS — Z66 Do not resuscitate: Secondary | ICD-10-CM | POA: Diagnosis present

## 2024-07-15 DIAGNOSIS — Z794 Long term (current) use of insulin: Secondary | ICD-10-CM

## 2024-07-15 DIAGNOSIS — Z86718 Personal history of other venous thrombosis and embolism: Secondary | ICD-10-CM

## 2024-07-15 DIAGNOSIS — D72819 Decreased white blood cell count, unspecified: Secondary | ICD-10-CM | POA: Insufficient documentation

## 2024-07-15 DIAGNOSIS — G9341 Metabolic encephalopathy: Secondary | ICD-10-CM | POA: Diagnosis present

## 2024-07-15 DIAGNOSIS — R531 Weakness: Secondary | ICD-10-CM

## 2024-07-15 DIAGNOSIS — E870 Hyperosmolality and hypernatremia: Secondary | ICD-10-CM | POA: Diagnosis not present

## 2024-07-15 DIAGNOSIS — F418 Other specified anxiety disorders: Secondary | ICD-10-CM | POA: Diagnosis present

## 2024-07-15 DIAGNOSIS — R29898 Other symptoms and signs involving the musculoskeletal system: Secondary | ICD-10-CM

## 2024-07-15 DIAGNOSIS — L130 Dermatitis herpetiformis: Secondary | ICD-10-CM

## 2024-07-15 DIAGNOSIS — B029 Zoster without complications: Secondary | ICD-10-CM

## 2024-07-15 DIAGNOSIS — N390 Urinary tract infection, site not specified: Principal | ICD-10-CM | POA: Diagnosis present

## 2024-07-15 DIAGNOSIS — R404 Transient alteration of awareness: Secondary | ICD-10-CM

## 2024-07-15 DIAGNOSIS — R569 Unspecified convulsions: Secondary | ICD-10-CM

## 2024-07-15 DIAGNOSIS — L989 Disorder of the skin and subcutaneous tissue, unspecified: Secondary | ICD-10-CM

## 2024-07-15 DIAGNOSIS — G4733 Obstructive sleep apnea (adult) (pediatric): Secondary | ICD-10-CM | POA: Diagnosis present

## 2024-07-15 DIAGNOSIS — E118 Type 2 diabetes mellitus with unspecified complications: Secondary | ICD-10-CM

## 2024-07-15 DIAGNOSIS — L8990 Pressure ulcer of unspecified site, unspecified stage: Secondary | ICD-10-CM | POA: Insufficient documentation

## 2024-07-15 DIAGNOSIS — C541 Malignant neoplasm of endometrium: Secondary | ICD-10-CM | POA: Diagnosis present

## 2024-07-15 LAB — URINE CULTURE: Culture: >=100000 — AB

## 2024-07-15 NOTE — ED Triage Notes (Signed)
 Pt BIBA from home, tx for bladder cancer, d/c yesterday from inpatient, on Abx for UTI. Woke up tonight with chills and abnormal gait. Pt on 2L O2 at baseline.  PTA  tylenol  2320   20 r wrist  300cc LR  HR 90's  CBG 209

## 2024-07-15 NOTE — Telephone Encounter (Signed)
 Per Dr. Gregg pt is to be scheduled for a Routine EEG. Called and LVM for pt to call back and schedule.

## 2024-07-15 NOTE — Telephone Encounter (Signed)
 Attempted to contact patient by phone after discharge from hospital to check on coping and follow-up on giving tree.  [name] daughter answered and took a message.   Desarai Barrack E Atticus Lemberger, LCSW

## 2024-07-15 NOTE — Telephone Encounter (Signed)
 Copied from CRM [id number]. Topic: General - Other >> Jul 13, 2024  2:18 PM Devaughn RAMAN wrote: Reason for CRM: Pt is currently inpatient and husband [name] would like to know if anyone can come see her in the hospital while she is currently inpatient. Please f/ u with [name] regarding this at [phone]. Pt appt is scheduled for November 12th.  I called and spoke with the pt's spouse  Pt was discharged home from the hospital on 07/14/24  She was scheduled f/u 11/24 but he needs appt moved up since she starts her chemo this date  Appt moved to TP 07/20/24  Nothing further needed at this time

## 2024-07-16 ENCOUNTER — Other Ambulatory Visit: Payer: Self-pay

## 2024-07-16 ENCOUNTER — Other Ambulatory Visit: Admitting: *Deleted

## 2024-07-16 ENCOUNTER — Encounter: Payer: Self-pay | Admitting: Neurology

## 2024-07-16 ENCOUNTER — Emergency Department (HOSPITAL_COMMUNITY)

## 2024-07-16 ENCOUNTER — Encounter: Payer: Self-pay | Admitting: Hematology and Oncology

## 2024-07-16 ENCOUNTER — Encounter (HOSPITAL_COMMUNITY): Payer: Self-pay | Admitting: Emergency Medicine

## 2024-07-16 ENCOUNTER — Telehealth: Payer: Self-pay | Admitting: Nurse Practitioner

## 2024-07-16 DIAGNOSIS — D72819 Decreased white blood cell count, unspecified: Secondary | ICD-10-CM | POA: Insufficient documentation

## 2024-07-16 DIAGNOSIS — N390 Urinary tract infection, site not specified: Secondary | ICD-10-CM | POA: Diagnosis present

## 2024-07-16 LAB — CBC WITH DIFFERENTIAL/PLATELET
Abs Immature Granulocytes: 0.01 K/uL (ref 0.00–0.07)
Basophils Absolute: 0 K/uL (ref 0.0–0.1)
Basophils Relative: 0 %
Eosinophils Absolute: 0 K/uL (ref 0.0–0.5)
Eosinophils Relative: 1 %
HCT: 29.7 % — ABNORMAL LOW (ref 36.0–46.0)
Hemoglobin: 9.5 g/dL — ABNORMAL LOW (ref 12.0–15.0)
Immature Granulocytes: 0 %
Lymphocytes Relative: 29 %
Lymphs Abs: 1 K/uL (ref 0.7–4.0)
MCH: 27.1 pg (ref 26.0–34.0)
MCHC: 32 g/dL (ref 30.0–36.0)
MCV: 84.9 fL (ref 80.0–100.0)
Monocytes Absolute: 0.3 K/uL (ref 0.1–1.0)
Monocytes Relative: 10 %
Neutro Abs: 2.1 K/uL (ref 1.7–7.7)
Neutrophils Relative %: 60 %
Platelets: 281 K/uL (ref 150–400)
RBC: 3.5 MIL/uL — ABNORMAL LOW (ref 3.87–5.11)
RDW: 15.8 % — ABNORMAL HIGH (ref 11.5–15.5)
WBC: 3.5 K/uL — ABNORMAL LOW (ref 4.0–10.5)
nRBC: 0 % (ref 0.0–0.2)

## 2024-07-16 LAB — URINALYSIS, W/ REFLEX TO CULTURE (INFECTION SUSPECTED)
Bilirubin Urine: NEGATIVE
Glucose, UA: NEGATIVE mg/dL
Hgb urine dipstick: NEGATIVE
Ketones, ur: 20 mg/dL — AB
Nitrite: NEGATIVE
Protein, ur: NEGATIVE mg/dL
Specific Gravity, Urine: 1.011 (ref 1.005–1.030)
WBC, UA: >50 WBC/hpf (ref 0–5)
pH: 5 (ref 5.0–8.0)

## 2024-07-16 LAB — GLUCOSE, CAPILLARY
Glucose-Capillary: 62 mg/dL — ABNORMAL LOW (ref 70–99)
Glucose-Capillary: 114 mg/dL — ABNORMAL HIGH (ref 70–99)
Glucose-Capillary: 92 mg/dL (ref 70–99)

## 2024-07-16 LAB — CBG MONITORING, ED
Glucose-Capillary: 244 mg/dL — ABNORMAL HIGH (ref 70–99)
Glucose-Capillary: 202 mg/dL — ABNORMAL HIGH (ref 70–99)
Glucose-Capillary: 127 mg/dL — ABNORMAL HIGH (ref 70–99)

## 2024-07-16 LAB — COMPREHENSIVE METABOLIC PANEL WITH GFR
ALT: 23 U/L (ref 0–44)
AST: 22 U/L (ref 15–41)
Albumin: 3.5 g/dL (ref 3.5–5.0)
Alkaline Phosphatase: 83 U/L (ref 38–126)
Anion gap: 9 (ref 5–15)
BUN: 9 mg/dL (ref 8–23)
CO2: 26 mmol/L (ref 22–32)
Calcium: 9.6 mg/dL (ref 8.9–10.3)
Chloride: 102 mmol/L (ref 98–111)
Creatinine, Ser: 0.56 mg/dL (ref 0.44–1.00)
GFR, Estimated: >60 mL/min (ref >60)
Glucose, Bld: 238 mg/dL — ABNORMAL HIGH (ref 70–99)
Potassium: 4.3 mmol/L (ref 3.5–5.1)
Sodium: 137 mmol/L (ref 135–145)
Total Bilirubin: 0.2 mg/dL (ref 0.0–1.2)
Total Protein: 6.8 g/dL (ref 6.5–8.1)

## 2024-07-16 LAB — PROTIME-INR
INR: 1 (ref 0.8–1.2)
Prothrombin Time: 14 s (ref 11.4–15.2)

## 2024-07-16 LAB — I-STAT CG4 LACTIC ACID, ED: Lactic Acid, Venous: 0.6 mmol/L (ref 0.5–1.9)

## 2024-07-16 MED ORDER — INSULIN DEGLUDEC 100 UNIT/ML ~~LOC~~ SOPN: 18.0000 [IU] | PEN_INJECTOR | Freq: Every day | SUBCUTANEOUS | Status: DC

## 2024-07-16 MED ORDER — HYDROXYZINE HCL 10 MG PO TABS
10.0000 mg | ORAL_TABLET | Freq: Three times a day (TID) | ORAL | Status: DC | PRN
  Administered 2024-07-17: 10 mg via ORAL
  Filled 2024-07-16 (×2): qty 1

## 2024-07-16 MED ORDER — MELATONIN 5 MG PO TABS: 5.0000 mg | ORAL_TABLET | Freq: Every evening | ORAL | Status: DC | PRN

## 2024-07-16 MED ORDER — PROCHLORPERAZINE MALEATE 10 MG PO TABS: 10.0000 mg | ORAL_TABLET | Freq: Four times a day (QID) | ORAL | Status: DC | PRN

## 2024-07-16 MED ORDER — INSULIN ASPART 100 UNIT/ML IJ SOLN
0.0000 [IU] | Freq: Three times a day (TID) | INTRAMUSCULAR | Status: DC
  Administered 2024-07-16: 2 [IU] via SUBCUTANEOUS
  Administered 2024-07-16: 5 [IU] via SUBCUTANEOUS
  Filled 2024-07-16: qty 5
  Filled 2024-07-16: qty 2

## 2024-07-16 MED ORDER — ONDANSETRON HCL 4 MG/2ML IJ SOLN
4.0000 mg | Freq: Once | INTRAMUSCULAR | Status: AC
  Administered 2024-07-16: 4 mg via INTRAVENOUS
  Filled 2024-07-16: qty 2

## 2024-07-16 MED ORDER — OXYCODONE HCL 5 MG PO TABS
10.0000 mg | ORAL_TABLET | Freq: Once | ORAL | Status: AC
  Administered 2024-07-16: 10 mg via ORAL
  Filled 2024-07-16: qty 2

## 2024-07-16 MED ORDER — MIRTAZAPINE 15 MG PO TABS
15.0000 mg | ORAL_TABLET | Freq: Every day | ORAL | Status: DC
  Administered 2024-07-16 – 2024-07-17 (×2): 15 mg via ORAL
  Filled 2024-07-16 (×2): qty 1

## 2024-07-16 MED ORDER — PREGABALIN 50 MG PO CAPS
50.0000 mg | ORAL_CAPSULE | Freq: Three times a day (TID) | ORAL | Status: DC
  Administered 2024-07-16 – 2024-07-17 (×6): 50 mg via ORAL
  Filled 2024-07-16 (×6): qty 1

## 2024-07-16 MED ORDER — FLUOXETINE HCL 20 MG PO CAPS
40.0000 mg | ORAL_CAPSULE | Freq: Every day | ORAL | Status: DC
  Administered 2024-07-16 – 2024-07-17 (×2): 40 mg via ORAL
  Filled 2024-07-16 (×2): qty 2

## 2024-07-16 MED ORDER — ATORVASTATIN CALCIUM 80 MG PO TABS
80.0000 mg | ORAL_TABLET | Freq: Every evening | ORAL | Status: DC
  Administered 2024-07-16 – 2024-07-17 (×2): 80 mg via ORAL
  Filled 2024-07-16: qty 2
  Filled 2024-07-16: qty 1
  Filled 2024-07-16: qty 2

## 2024-07-16 MED ORDER — INSULIN ASPART 100 UNIT/ML IJ SOLN
0.0000 [IU] | Freq: Every day | INTRAMUSCULAR | Status: DC
  Administered 2024-07-19: 2 [IU] via SUBCUTANEOUS
  Filled 2024-07-16: qty 2

## 2024-07-16 MED ORDER — ONDANSETRON HCL 4 MG/2ML IJ SOLN: 4.0000 mg | Freq: Four times a day (QID) | INTRAMUSCULAR | Status: DC | PRN

## 2024-07-16 MED ORDER — SENNOSIDES-DOCUSATE SODIUM 8.6-50 MG PO TABS
1.0000 | ORAL_TABLET | Freq: Every evening | ORAL | Status: DC | PRN
  Administered 2024-08-23 – 2024-08-28 (×2): 1 via ORAL
  Filled 2024-07-16 (×2): qty 1

## 2024-07-16 MED ORDER — HALOPERIDOL 0.5 MG PO TABS
2.0000 mg | ORAL_TABLET | Freq: Two times a day (BID) | ORAL | Status: DC
  Administered 2024-07-16 – 2024-07-17 (×4): 2 mg via ORAL
  Filled 2024-07-16 (×2): qty 4
  Filled 2024-07-16: qty 2
  Filled 2024-07-16: qty 4

## 2024-07-16 MED ORDER — ACETAMINOPHEN 325 MG PO TABS
650.0000 mg | ORAL_TABLET | Freq: Four times a day (QID) | ORAL | Status: DC | PRN
  Administered 2024-07-28 – 2024-08-01 (×5): 650 mg via ORAL
  Filled 2024-07-16 (×5): qty 2

## 2024-07-16 MED ORDER — MORPHINE SULFATE (PF) 4 MG/ML IV SOLN
4.0000 mg | Freq: Once | INTRAVENOUS | Status: AC
  Administered 2024-07-16: 4 mg via INTRAVENOUS
  Filled 2024-07-16: qty 1

## 2024-07-16 MED ORDER — SODIUM CHLORIDE 0.9 % IV SOLN: 2.0000 g | Freq: Once | INTRAVENOUS | Status: DC

## 2024-07-16 MED ORDER — OXYCODONE HCL 5 MG PO TABS
10.0000 mg | ORAL_TABLET | Freq: Four times a day (QID) | ORAL | Status: DC | PRN
  Administered 2024-07-16 – 2024-07-18 (×4): 10 mg via ORAL
  Filled 2024-07-16 (×4): qty 2

## 2024-07-16 MED ORDER — INSULIN GLARGINE-YFGN 100 UNIT/ML ~~LOC~~ SOLN
18.0000 [IU] | Freq: Every day | SUBCUTANEOUS | Status: DC
  Administered 2024-07-16 – 2024-07-17 (×2): 18 [IU] via SUBCUTANEOUS
  Filled 2024-07-16 (×2): qty 0.18

## 2024-07-16 MED ORDER — ONDANSETRON HCL 4 MG PO TABS: 4.0000 mg | ORAL_TABLET | Freq: Four times a day (QID) | ORAL | Status: DC | PRN

## 2024-07-16 MED ORDER — LORAZEPAM 1 MG PO TABS
1.0000 mg | ORAL_TABLET | Freq: Once | ORAL | Status: AC
  Administered 2024-07-16: 1 mg via ORAL
  Filled 2024-07-16: qty 1

## 2024-07-16 MED ORDER — SODIUM CHLORIDE 0.9 % IV SOLN
2.0000 g | Freq: Three times a day (TID) | INTRAVENOUS | Status: DC
  Administered 2024-07-16 – 2024-07-19 (×9): 2 g via INTRAVENOUS
  Filled 2024-07-16 (×9): qty 12.5

## 2024-07-16 MED ORDER — ACETAMINOPHEN 650 MG RE SUPP
650.0000 mg | Freq: Four times a day (QID) | RECTAL | Status: DC | PRN
  Administered 2024-07-21 – 2024-07-22 (×2): 650 mg via RECTAL
  Filled 2024-07-16 (×2): qty 1

## 2024-07-16 MED ORDER — ENOXAPARIN SODIUM 80 MG/0.8ML IJ SOSY
1.0000 mg/kg | PREFILLED_SYRINGE | Freq: Two times a day (BID) | INTRAMUSCULAR | Status: DC
  Administered 2024-07-16 – 2024-07-22 (×13): 80 mg via SUBCUTANEOUS
  Filled 2024-07-16 (×13): qty 0.8

## 2024-07-16 MED ORDER — ENOXAPARIN SODIUM 80 MG/0.8ML IJ SOSY
80.0000 mg | PREFILLED_SYRINGE | Freq: Once | INTRAMUSCULAR | Status: DC
  Filled 2024-07-16: qty 0.8

## 2024-07-16 MED ORDER — PROCHLORPERAZINE EDISYLATE 10 MG/2ML IJ SOLN
10.0000 mg | Freq: Once | INTRAMUSCULAR | Status: AC
  Administered 2024-07-16: 10 mg via INTRAVENOUS
  Filled 2024-07-16: qty 2

## 2024-07-16 MED ORDER — SODIUM CHLORIDE 0.9 % IV SOLN
2.0000 g | Freq: Once | INTRAVENOUS | Status: AC
  Administered 2024-07-16: 2 g via INTRAVENOUS
  Filled 2024-07-16: qty 20

## 2024-07-16 NOTE — H&P (Signed)
 History and Physical    Brenda [name] FMW:[id number] DOB: 11/08/1960 DOA: 07/15/2024  DOS: the patient was seen and examined on 07/15/2024  PCP: [facility]   Patient coming from: Home  I have personally briefly reviewed patient's old medical records in [facility] and CareEverywhere  HPI:  Brenda [name] is a 63 y.o. 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  She presents to [facility] ED with reports of chills and weakness preventing her from ambulating.  Note she was recently discharged from [facility] where she was treated for a UTI and discharged on cefadroxil .  She reports she was compliant with the cefadroxil  however noticed a recurrence in symptoms after leaving the hospital.  She endorses flank pain, urinary urgency, denies frequency, denies dysuria.  She denies subjective fever.   ED Course: On arrival to [facility] ED patient was noted to be afebrile temp 36.7 C, BP 153/60, HR 91, RR 19, SpO2 100% on room air.  CXR obtained and shows no acute cardiopulmonary findings.  Labs notable for urinalysis with moderate leukocytes, many bacteria, and >50 WBCs.  Urine culture collected and in process.  Urine culture from 07/12/24 with greater than 100,000 colonies of Enterobacter cloacae with sensitivity to cefepime .  Blood cultures drawn and in process.  Leukopenia noted with WBC 3.7, hemoglobin 9.5 which is in line with recent baseline, and lactic acid 0.6.  She was given Rocephin , Ativan, morphine , Zofran , oxycodone , and Compazine . TRH contacted for admission.  Review of Systems: As mentioned in the history of present illness. All other systems reviewed and are negative.  Review of  Systems  Constitutional:  Positive for chills and malaise/fatigue.  HENT:  Negative for congestion.   Respiratory:  Negative for cough, sputum production and shortness of breath.   Cardiovascular:  Negative for chest pain, palpitations and leg swelling.  Gastrointestinal:  Negative for abdominal pain, nausea and vomiting.  Genitourinary:  Positive for flank pain and urgency. Negative for dysuria and frequency.  Musculoskeletal:  Positive for myalgias.  Neurological:  Positive for weakness. Negative for focal weakness.  All other systems reviewed and are negative.   Past Medical History:  Diagnosis Date   Anemia    Arthritis    back, hands, hips (02/22/2015)   CAD (coronary artery disease)    a. complex LAD/diagonal bifurcation PCI in 2010. b. STEMI 10/2019 s/p PTCA/DES x1 to mLAD overlapping the old stent, residual disease treated medicaly.   Cancer [facility])    uterine   CHF (congestive heart failure) (HCC)    Depression    Diabetes mellitus (HCC)    started when I was pregnant; not sure if it was type 1 or type 2    History of radiation therapy    Endometrium- HDR 01/22/22-03/07/22- Dr. Lynwood Nasuti   Hypercholesterolemia    Hypertension    Morbid obesity (HCC)    Myocardial infarction (HCC)    mild x 3   Neuromuscular disorder (HCC)    neuropathy feet   Sleep apnea    cpap/    Past Surgical History:  Procedure Laterality Date   CARDIAC CATHETERIZATION  12/02/2012   CHOLECYSTECTOMY OPEN  09/04/1987   CORONARY ANGIOPLASTY WITH STENT PLACEMENT  09/03/2009   CORONARY/GRAFT ACUTE MI REVASCULARIZATION N/A  10/26/2019   Procedure: CORONARY/GRAFT ACUTE MI REVASCULARIZATION;  Surgeon: Verlin Lonni BIRCH, MD;  Location: [facility] INVASIVE CV LAB;  Service: Cardiovascular;  Laterality: N/A;   INCISIONAL HERNIA REPAIR N/A 12/09/2021   Procedure: LAPAROSCOPIC REPAIR UMBILICAL HERNIA WITH MESH;  Surgeon: Vernetta Berg, MD;  Location: [facility] ORS;  Service: General;  Laterality: N/A;   IR  IMAGING GUIDED PORT INSERTION  04/08/2024   LEFT HEART CATH AND CORONARY ANGIOGRAPHY N/A 10/26/2019   Procedure: LEFT HEART CATH AND CORONARY ANGIOGRAPHY;  Surgeon: Verlin Lonni BIRCH, MD;  Location: [facility] INVASIVE CV LAB;  Service: Cardiovascular;  Laterality: N/A;   LEFT HEART CATHETERIZATION WITH CORONARY ANGIOGRAM N/A 12/25/2012   Procedure: LEFT HEART CATHETERIZATION WITH CORONARY ANGIOGRAM;  Surgeon: Ozell Fell, MD;  Location: [facility] CATH LAB;  Service: Cardiovascular;  Laterality: N/A;   RADIOLOGY WITH ANESTHESIA N/A 04/21/2024   Procedure: MRI WITH ANESTHESIA;  Surgeon: Radiologist, Medication, MD;  Location: [facility] OR;  Service: Radiology;  Laterality: N/A;  BRAIN W/ W/O   ROBOTIC ASSISTED TOTAL HYSTERECTOMY WITH BILATERAL SALPINGO OOPHERECTOMY N/A 12/06/2021   Procedure: XI ROBOTIC ASSISTED TOTAL HYSTERECTOMY WITH BILATERAL SALPINGO OOPHORECTOMY;  Surgeon: [name] SAUNDERS, MD;  Location: [facility] ORS;  Service: Gynecology;  Laterality: N/A;   UMBILICAL HERNIA REPAIR  05/04/1989   doesn't think they used mesh     reports that she quit smoking about 37 years ago. Her smoking use included cigarettes and cigars. She started smoking about 38 years ago. She has a 0.5 pack-year smoking history. She has never used smokeless tobacco. She reports that she does not drink alcohol and does not use drugs.  Allergies  Allergen Reactions   Hydrocodone Shortness Of Breath, Dermatitis and Other (See Comments)    I forget to breathe.   Clopidogrel Hives and Itching    Family History  Problem Relation Age of Onset   Coronary artery disease Mother        in her mid 66s   Hypertension Mother    Cancer Mother        skin   Heart attack Father        in his mid 42s   COPD Father    Stroke Sister    Coronary artery disease Sister    Diabetes Sister    Other Half-Sister    Thyroid  cancer Daughter    Prostate cancer Neg Hx    Colon cancer Neg Hx    Breast cancer Neg Hx    Endometrial cancer Neg Hx     Ovarian cancer Neg Hx    Pancreatic cancer Neg Hx     Prior to Admission medications   Medication Sig Start Date End Date Taking? Authorizing Provider  acetaminophen  (TYLENOL ) 500 MG tablet Take 1-2 tablets (500-1,000 mg total) by mouth every 8 (eight) hours as needed for mild pain (pain score 1-3) or moderate pain (pain score 4-6). 03/30/24 03/30/25  Hongalgi, Anand D, MD  atorvastatin  (LIPITOR ) 80 MG tablet Take 1 tablet (80 mg total) by mouth every evening. 06/11/24   Jerilynn Daphne SAILOR, NP  cefadroxil  (DURICEF) 500 MG capsule Take 1 capsule (500 mg total) by mouth 2 (two) times daily for 7 days. 07/14/24 07/21/24  Barbarann Nest, MD  Cholecalciferol  (VITAMIN D3 SUPER STRENGTH) 50 MCG (2000 UT) CAPS Take 2,000 Units by mouth in the morning.    [provider]  enoxaparin  (LOVENOX ) 80 MG/0.8ML injection Inject 0.8 mLs (80 mg total) into the skin every 12 (twelve) hours. 06/22/24 07/22/24  Elgergawy, Dawood  S, MD  FLUoxetine  (PROZAC ) 40 MG capsule Take 1 capsule (40 mg total) by mouth daily. 06/11/24   Jerilynn Daphne SAILOR, NP  guaiFENesin  (MUCINEX ) 600 MG 12 hr tablet Take 1 tablet (600 mg total) by mouth 2 (two) times daily as needed for cough or to loosen phlegm. 05/05/24   Amin, Sumayya, MD  haloperidol (HALDOL) 1 MG tablet Take 2 tablets (2 mg total) by mouth 2 (two) times daily. 06/22/24   Elgergawy, Dawood S, MD  hydrOXYzine (ATARAX) 10 MG tablet Take 1 tablet (10 mg total) by mouth 3 (three) times daily as needed for anxiety. 06/22/24   Elgergawy, Brayton RAMAN, MD  insulin  aspart (FIASP ) 100 UNIT/ML FlexTouch Pen Inject 1-3 Units into the skin 3 (three) times daily with meals. Per sliding scale    [provider]  insulin  degludec (TRESIBA) 100 UNIT/ML FlexTouch Pen Inject 25 Units into the skin daily. Patient taking differently: Inject 18 Units into the skin daily. 06/29/24   Lonn Hicks, MD  lactulose  (CHRONULAC ) 10 GM/15ML solution Take 45 mLs (30 g total) by mouth 2 (two)  times daily as needed for mild constipation or moderate constipation. 05/05/24   Caleen Qualia, MD  lidocaine  (LIDODERM ) 5 % Place 1 patch onto the skin daily. Placed on back below tumor 04/27/24   [provider]  metFORMIN (GLUCOPHAGE-XR) 500 MG 24 hr tablet Take 2 tablets (1,000 mg total) by mouth 2 (two) times daily with a meal. 06/11/24   Jerilynn Daphne SAILOR, NP  mirtazapine (REMERON) 15 MG tablet Take 1 tablet (15 mg total) by mouth at bedtime. 06/22/24   Elgergawy, Brayton RAMAN, MD  Misc Natural Products (NEURIVA PO) Take 1 tablet by mouth daily.    [provider]  Misc. Devices MISC Portable oxygen  concentrator, 2L oxygen .  Diagnoses-chronic respiratory failure with hypoxia 11/27/21   Delbert Clam, MD  nitroGLYCERIN  (NITROSTAT ) 0.4 MG SL tablet Dissolve 1 tablet under the tongue every 5 minutes as needed for chest pain. Max of 3 doses, then 911. Patient not taking: No sig reported 01/25/23   Lelon Hamilton T, PA-C  nystatin cream (MYCOSTATIN) Apply topically 2 (two) times daily. Patient taking differently: Apply topically 2 (two) times daily as needed for dry skin (yeast). 06/11/24   Jerilynn Daphne SAILOR, NP  Oxycodone  HCl 10 MG TABS Take 1 tablet (10 mg total) by mouth every 6 (six) hours as needed (pain). 06/29/24   Lonn Hicks, MD  polyethylene glycol powder (GLYCOLAX /MIRALAX ) 17 GM/SCOOP powder Dissolve 1 capful (17g) in 4-8 ounces of liquid and take by mouth daily as needed for mild constipation. 06/11/24   Jerilynn Daphne SAILOR, NP  pregabalin  (LYRICA ) 50 MG capsule Take 1 capsule (50 mg total) by mouth 3 (three) times daily. 06/11/24   Jerilynn Daphne SAILOR, NP  prochlorperazine  (COMPAZINE ) 10 MG tablet Take 1 tablet (10 mg total) by mouth every 6 (six) hours as needed for nausea or vomiting. 06/30/24   Lonn Hicks, MD    Physical Exam: Vitals:   07/16/24 0430 07/16/24 0725 07/16/24 0737 07/16/24 1023  BP: (!) 132/58  (!) 147/65 (!) 120/57  Pulse: 71  80 75  Resp: 13  14 14   Temp:  97.9 F (36.6 C)  97.7 F (36.5 C)   TempSrc:      SpO2: 100% 100% 100% 100%    Physical Exam Vitals and nursing note reviewed.  Constitutional:      General: She is not in acute distress.    Appearance: She is  ill-appearing.  HENT:     Head: Normocephalic.  Eyes:     Pupils: Pupils are equal, round, and reactive to light.  Cardiovascular:     Rate and Rhythm: Normal rate. Rhythm irregular.     Pulses: Normal pulses.     Heart sounds: No murmur heard.    No friction rub. No gallop.  Pulmonary:     Effort: Pulmonary effort is normal. No respiratory distress.     Breath sounds: Normal breath sounds. No wheezing, rhonchi or rales.  Abdominal:     General: Bowel sounds are normal.  Skin:    General: Skin is warm.     Capillary Refill: Capillary refill takes less than 2 seconds.     Comments: Intertriginal erythema and maceration  Neurological:     General: No focal deficit present.     Mental Status: She is alert and oriented to person, place, and time.     Motor: Weakness present.     Labs on Admission: I have personally reviewed following labs and imaging studies  CBC: Recent Labs  Lab 07/12/24 2313 07/13/24 0733 07/14/24 0450 07/16/24 0200  WBC 4.0 3.7* 4.1 3.5*  NEUTROABS 2.3  --   --  2.1  HGB 9.3* 8.7* 8.9* 9.5*  HCT 30.1* 29.3* 28.5* 29.7*  MCV 86.0 86.9 85.3 84.9  PLT 258 258 259 281   Basic Metabolic Panel: Recent Labs  Lab 07/12/24 2313 07/13/24 0733 07/14/24 0450 07/16/24 0200  NA 134* 138 138 137  K 4.3 4.1 4.2 4.3  CL 100 103 102 102  CO2 22 23 26 26   GLUCOSE 183* 93 187* 238*  BUN 9 9 8 9   CREATININE 0.54 0.49 0.48 0.56  CALCIUM  9.1 9.3 9.2 9.6   GFR: Estimated Creatinine Clearance: 72.6 mL/min (by C-G formula based on SCr of 0.56 mg/dL). Liver Function Tests: Recent Labs  Lab 07/12/24 2313 07/16/24 0200  AST 24 22  ALT 24 23  ALKPHOS 70 83  BILITOT 0.2 0.2  PROT 6.8 6.8  ALBUMIN 3.0* 3.5   No results for input(s): LIPASE,  AMYLASE in the last 168 hours. No results for input(s): AMMONIA in the last 168 hours. Coagulation Profile: Recent Labs  Lab 07/12/24 2313 07/16/24 0200  INR 1.0 1.0   Cardiac Enzymes: No results for input(s): CKTOTAL, CKMB, CKMBINDEX, TROPONINI, TROPONINIHS in the last 168 hours. BNP (last 3 results) Recent Labs    06/19/24 1730  BNP 161.6*   HbA1C: No results for input(s): HGBA1C in the last 72 hours. CBG: Recent Labs  Lab 07/13/24 2134 07/14/24 0727 07/14/24 1127 07/16/24 0235 07/16/24 0822  GLUCAP 239* 177* 225* 244* 202*   Lipid Profile: No results for input(s): CHOL, HDL, LDLCALC, TRIG, CHOLHDL, LDLDIRECT in the last 72 hours. Thyroid  Function Tests: No results for input(s): TSH, T4TOTAL, FREET4, T3FREE, THYROIDAB in the last 72 hours. Anemia Panel: No results for input(s): VITAMINB12, FOLATE, FERRITIN, TIBC, IRON, RETICCTPCT in the last 72 hours. Urine analysis:    Component Value Date/Time   COLORURINE YELLOW 07/16/2024 0143   APPEARANCEUR CLEAR 07/16/2024 0143   LABSPEC 1.011 07/16/2024 0143   PHURINE 5.0 07/16/2024 0143   GLUCOSEU NEGATIVE 07/16/2024 0143   HGBUR NEGATIVE 07/16/2024 0143   BILIRUBINUR NEGATIVE 07/16/2024 0143   BILIRUBINUR neg 08/16/2016 1147   KETONESUR 20 (A) 07/16/2024 0143   PROTEINUR NEGATIVE 07/16/2024 0143   UROBILINOGEN 2.0 08/16/2016 1147   UROBILINOGEN 2.0 (H) 02/22/2015 2143   NITRITE NEGATIVE 07/16/2024 0143  LEUKOCYTESUR MODERATE (A) 07/16/2024 0143    Radiological Exams on Admission: I have personally reviewed images DG Chest Port 1 View Result Date: 07/16/2024 EXAM: 1 VIEW(S) XRAY OF THE CHEST 07/16/2024 01:29:00 AM COMPARISON: 07/12/2024. CLINICAL HISTORY: Questionable sepsis - evaluate for abnormality. FINDINGS: LINES, TUBES AND DEVICES: Right chest port terminates at the cavoatrial junction. LUNGS AND PLEURA: No focal pulmonary opacity. No pleural effusion. No  pneumothorax. HEART AND MEDIASTINUM: No acute abnormality of the cardiac and mediastinal silhouettes. BONES AND SOFT TISSUES: No acute osseous abnormality. IMPRESSION: 1. No acute findings. Electronically signed by: Pinkie Pebbles MD 07/16/2024 01:32 AM EST RP Workstation: [id number]    EKG: My personal interpretation of EKG shows: Atrial Fibrillation, HR 79 bpm    Assessment/Plan Active Problems:   Chronic hypoxic respiratory failure (HCC)   UTI (urinary tract infection)   Leukopenia   ##Urinary Tract Infection, failed outpatient p.o. antibiotics ##Leukopenia No sepsis physiology on admission - IV Cefepime  - Follow Urine culture--> culture from 11/9 with >100,000 colonies of Enterobacter Cloacae and susceptible to Cefepime  - Follow fever curve  #Endometrial cancer with Metastases #Chronic cancer-related pain - Followed by outpatient oncology Dr. Lonn - Continue home Lyrica  and as needed oxycodone   #Chronic hypoxic respiratory failure #Obstructive sleep apnea - Continue supplemental O2 - Continue home CPAP qHS  #Hx of PE and DVT - Continue home Lovenoc  #Type 2 diabetes mellitus - Continue home Tresiba or formulary alternative - AC/HS SSI and CBG monitoring - Carb modified diet  #Depression #Anxiety - Continue Prozac , Remeron, Haldol, and PRN Hydroxyzine   VTE prophylaxis:  Full dose Lovenox  GI prophylaxis: Protonix Diet: Carb Modified Diet Access: PIV Lines: (R) chest Port Code Status:  DNR/DNI(Do NOT Intubate) Telemetry: No  Admission status: Observation, Med-Surg Patient is from: Home  Anticipated d/c is to: Home  Anticipated d/c date is: 1-2 days Patient currently: Receiving IV antibiotics  Family Communication: Daughter at bedside   Consults called: N/A    Severity of Illness: The appropriate patient status for this patient is OBSERVATION. Observation status is judged to be reasonable and necessary in order to provide the required intensity of  service to ensure the patient's safety. The patient's presenting symptoms, physical exam findings, and initial radiographic and laboratory data in the context of their medical condition is felt to place them at decreased risk for further clinical deterioration. Furthermore, it is anticipated that the patient will be medically stable for discharge from the hospital within 2 midnights of admission.   To reach the provider On-Call:   7AM- 7PM see care teams to locate the attending and reach out to them via [email or web address] Password: [email or web address] 7PM-7AM contact night-coverage If you still have difficulty reaching the appropriate provider, please page the [facility] (Director on Call) for [facility] on amion for assistance  This document was prepared using Conservation officer, historic buildings and may include unintentional dictation errors.  Rockie Rams FNP-BC, PMHNP-BC Nurse Practitioner [facility]

## 2024-07-16 NOTE — ED Notes (Signed)
CBG 202 

## 2024-07-16 NOTE — Plan of Care (Signed)

## 2024-07-16 NOTE — Telephone Encounter (Signed)
 I left voicemail for patient to return my call if date/time does not work for her. I went ahead and scheduled new patient appointment due to urgency per staff message.

## 2024-07-16 NOTE — Progress Notes (Signed)
   07/16/24 1434  TOC Brief Assessment  Insurance and Status Reviewed  Patient has primary care physician Yes ([facility]  Home environment has been reviewed Home with spouse  Prior level of function: Independent with assist  Prior/Current Home Services Current home services ([facility])  Social Drivers of Health Review SDOH reviewed no interventions necessary  Readmission risk has been reviewed Yes  Transition of care needs no transition of care needs at this time

## 2024-07-16 NOTE — ED Provider Notes (Signed)
 [place] EMERGENCY DEPARTMENT AT [facility] Provider Note   CSN: [id number] Arrival date & time: 07/15/24  2337     Chief complaint: Chills, weakness  Brenda [name] is a 63 y.o. female.   The history is provided by the patient.   She has a history of hypertension, diabetes, hyperlipidemia, coronary artery disease, heart failure, uterine cancer, bladder cancer, pulmonary embolism anticoagulated on enoxaparin , recently discharged from the hospital with a UTI on cefadroxil  comes in because of chills tonight.  She also noted that she was weak.  She had been able to ambulate and now she is not able to support her weight.  She was not aware fever or sweats.  She denies any cough, nausea, vomiting.  She has chronic numbness in her feet from neuropathy, and this has not changed.    Prior to Admission medications   Medication Sig Start Date End Date Taking? Authorizing Provider  acetaminophen  (TYLENOL ) 500 MG tablet Take 1-2 tablets (500-1,000 mg total) by mouth every 8 (eight) hours as needed for mild pain (pain score 1-3) or moderate pain (pain score 4-6). 03/30/24 03/30/25  Hongalgi, Anand D, MD  atorvastatin  (LIPITOR ) 80 MG tablet Take 1 tablet (80 mg total) by mouth every evening. 06/11/24   Jerilynn Daphne SAILOR, NP  cefadroxil  (DURICEF) 500 MG capsule Take 1 capsule (500 mg total) by mouth 2 (two) times daily for 7 days. 07/14/24 07/21/24  Barbarann Nest, MD  Cholecalciferol  (VITAMIN D3 SUPER STRENGTH) 50 MCG (2000 UT) CAPS Take 2,000 Units by mouth in the morning.    [provider]  enoxaparin  (LOVENOX ) 80 MG/0.8ML injection Inject 0.8 mLs (80 mg total) into the skin every 12 (twelve) hours. 06/22/24 07/22/24  Elgergawy, Brayton RAMAN, MD  FLUoxetine  (PROZAC ) 40 MG capsule Take 1 capsule (40 mg total) by mouth daily. 06/11/24   Jerilynn Daphne SAILOR, NP  guaiFENesin  (MUCINEX ) 600 MG 12 hr tablet Take 1 tablet (600 mg total) by mouth 2 (two) times daily as needed for cough or  to loosen phlegm. 05/05/24   Amin, Sumayya, MD  haloperidol (HALDOL) 1 MG tablet Take 2 tablets (2 mg total) by mouth 2 (two) times daily. 06/22/24   Elgergawy, Dawood S, MD  hydrOXYzine (ATARAX) 10 MG tablet Take 1 tablet (10 mg total) by mouth 3 (three) times daily as needed for anxiety. 06/22/24   Elgergawy, Brayton RAMAN, MD  insulin  aspart (FIASP ) 100 UNIT/ML FlexTouch Pen Inject 1-3 Units into the skin 3 (three) times daily with meals. Per sliding scale    [provider]  insulin  degludec (TRESIBA) 100 UNIT/ML FlexTouch Pen Inject 25 Units into the skin daily. Patient taking differently: Inject 18 Units into the skin daily. 06/29/24   Lonn Hicks, MD  lactulose  (CHRONULAC ) 10 GM/15ML solution Take 45 mLs (30 g total) by mouth 2 (two) times daily as needed for mild constipation or moderate constipation. 05/05/24   Caleen Qualia, MD  lidocaine  (LIDODERM ) 5 % Place 1 patch onto the skin daily. Placed on back below tumor 04/27/24   [provider]  metFORMIN (GLUCOPHAGE-XR) 500 MG 24 hr tablet Take 2 tablets (1,000 mg total) by mouth 2 (two) times daily with a meal. 06/11/24   Jerilynn Daphne SAILOR, NP  mirtazapine (REMERON) 15 MG tablet Take 1 tablet (15 mg total) by mouth at bedtime. 06/22/24   Elgergawy, Brayton RAMAN, MD  Misc Natural Products (NEURIVA PO) Take 1 tablet by mouth daily.    [provider]  Misc. Devices  MISC Portable oxygen  concentrator, 2L oxygen .  Diagnoses-chronic respiratory failure with hypoxia 11/27/21   Newlin, Enobong, MD  nitroGLYCERIN  (NITROSTAT ) 0.4 MG SL tablet Dissolve 1 tablet under the tongue every 5 minutes as needed for chest pain. Max of 3 doses, then 911. Patient not taking: No sig reported 01/25/23   Lelon Hamilton T, PA-C  nystatin cream (MYCOSTATIN) Apply topically 2 (two) times daily. Patient taking differently: Apply topically 2 (two) times daily as needed for dry skin (yeast). 06/11/24   Jerilynn Daphne SAILOR, NP  Oxycodone  HCl 10 MG TABS Take 1 tablet  (10 mg total) by mouth every 6 (six) hours as needed (pain). 06/29/24   Lonn Hicks, MD  polyethylene glycol powder (GLYCOLAX /MIRALAX ) 17 GM/SCOOP powder Dissolve 1 capful (17g) in 4-8 ounces of liquid and take by mouth daily as needed for mild constipation. 06/11/24   Jerilynn Daphne SAILOR, NP  pregabalin  (LYRICA ) 50 MG capsule Take 1 capsule (50 mg total) by mouth 3 (three) times daily. 06/11/24   Jerilynn Daphne SAILOR, NP  prochlorperazine  (COMPAZINE ) 10 MG tablet Take 1 tablet (10 mg total) by mouth every 6 (six) hours as needed for nausea or vomiting. 06/30/24   Lonn Hicks, MD    Allergies: Hydrocodone and Clopidogrel    Review of Systems  All other systems reviewed and are negative.   Updated Vital Signs BP (!) 153/60 (BP Location: Left Arm)   Pulse 91   Temp 98 F (36.7 C) (Oral)   Resp 19   LMP 10/30/2014 (Approximate)   SpO2 100%   Physical Exam Vitals and nursing note reviewed.   63 year old female, resting comfortably and in no acute distress. Vital signs are significant for elevated blood pressure. Oxygen  saturation is 100%, which is normal. Head is normocephalic and atraumatic. PERRLA, EOMI.  Lungs are clear without rales, wheezes, or rhonchi. Chest is nontender.  Mediport is present on the right. Heart has regular rate and rhythm with 2/6 soft systolic murmur. Abdomen is soft, flat, nontender. Extremities have no cyanosis or edema. Skin is warm and dry without rash. Neurologic: Mental status is normal, cranial nerves are intact.  She has mild weakness of hip flexion on the left (4/5), normal strength of hip flexion on the right.  Normal strength of plantarflexion and dorsiflexion bilaterally.  (all labs ordered are listed, but only abnormal results are displayed) Labs Reviewed - No data to display  EKG: None  Radiology: No results found.   Procedures   Medications Ordered in the ED - No data to display                                  Medical Decision  Making Amount and/or Complexity of Data Reviewed Labs: ordered. Radiology: ordered.  Risk Prescription drug management. Decision regarding hospitalization.   Weakness in the setting of chills concerning for sepsis.  At this point, I do not see evidence of spinal cord compression.  I reviewed her past records, and note discharge from hospital on 07/14/2024 following admission for UTI, metabolic encephalopathy.  I have initiated the evolving sepsis pathway.  Of note, she has missed her evening dose of oxycodone  and I have ordered that for her.  I have reviewed her laboratory tests, and my interpretation is normal lactic acid level, metabolic panel significant only for mildly elevated glucose, stable anemia, leukopenia which is new but not at a critical level, normal INR, urinalysis consistent with  UTI with many bacteria and greater than 50 WBCs on a catheterized specimen.  I have ordered a dose of ceftriaxone .  Patient was reevaluated and is not feeling any better, continues to have weakness and generally not feeling well.  Ongoing abnormal urinalysis suggest treatment failure.  Culture result is still pending.  I have discussed case with Dr. Franky of [facility], who agrees to admit the patient.     Final diagnoses:  Urinary tract infection without hematuria, site unspecified  Weakness  Leukopenia, unspecified type  Normochromic normocytic anemia    ED Discharge Orders     None          Raford Lenis, MD 07/16/24 [id number]

## 2024-07-16 NOTE — Progress Notes (Signed)
   07/16/24 1922  BiPAP/CPAP/SIPAP  $ Non-Invasive Home Ventilator  Initial  $ Face Mask Medium Yes  BiPAP/CPAP/SIPAP Pt Type Adult  BiPAP/CPAP/SIPAP Resmed  Mask Type Full face mask  Dentures removed? Not applicable  Mask Size Medium  EPAP 14 cmH2O  Flow Rate 2 lpm  Patient Home Machine No  Patient Home Mask No  Patient Home Tubing No  Auto Titrate No  Device Plugged into RED Power Outlet Yes  BiPAP/CPAP /SiPAP Vitals  Pulse Rate 77  Resp 16  SpO2 99 %  Bilateral Breath Sounds Clear;Diminished  MEWS Score/Color  MEWS Score 0  MEWS Score Color Landy

## 2024-07-17 ENCOUNTER — Telehealth: Payer: Self-pay

## 2024-07-17 DIAGNOSIS — N390 Urinary tract infection, site not specified: Secondary | ICD-10-CM | POA: Diagnosis not present

## 2024-07-17 LAB — BASIC METABOLIC PANEL WITH GFR
Anion gap: 9 (ref 5–15)
BUN: 9 mg/dL (ref 8–23)
CO2: 27 mmol/L (ref 22–32)
Calcium: 9.4 mg/dL (ref 8.9–10.3)
Chloride: 104 mmol/L (ref 98–111)
Creatinine, Ser: 0.61 mg/dL (ref 0.44–1.00)
GFR, Estimated: >60 mL/min (ref >60)
Glucose, Bld: 100 mg/dL — ABNORMAL HIGH (ref 70–99)
Potassium: 4.3 mmol/L (ref 3.5–5.1)
Sodium: 140 mmol/L (ref 135–145)

## 2024-07-17 LAB — URINE CULTURE: Culture: NO GROWTH

## 2024-07-17 LAB — CBC
HCT: 29.9 % — ABNORMAL LOW (ref 36.0–46.0)
Hemoglobin: 9.3 g/dL — ABNORMAL LOW (ref 12.0–15.0)
MCH: 26.8 pg (ref 26.0–34.0)
MCHC: 31.1 g/dL (ref 30.0–36.0)
MCV: 86.2 fL (ref 80.0–100.0)
Platelets: 253 K/uL (ref 150–400)
RBC: 3.47 MIL/uL — ABNORMAL LOW (ref 3.87–5.11)
RDW: 16.3 % — ABNORMAL HIGH (ref 11.5–15.5)
WBC: 4.2 K/uL (ref 4.0–10.5)
nRBC: 0 % (ref 0.0–0.2)

## 2024-07-17 LAB — CULTURE, BLOOD (ROUTINE X 2)
Culture: NO GROWTH
Special Requests: ADEQUATE

## 2024-07-17 LAB — GLUCOSE, CAPILLARY
Glucose-Capillary: 93 mg/dL (ref 70–99)
Glucose-Capillary: 112 mg/dL — ABNORMAL HIGH (ref 70–99)
Glucose-Capillary: 165 mg/dL — ABNORMAL HIGH (ref 70–99)
Glucose-Capillary: 162 mg/dL — ABNORMAL HIGH (ref 70–99)
Glucose-Capillary: 150 mg/dL — ABNORMAL HIGH (ref 70–99)

## 2024-07-17 LAB — MAGNESIUM: Magnesium: 1.6 mg/dL — ABNORMAL LOW (ref 1.7–2.4)

## 2024-07-17 MED ORDER — MORPHINE SULFATE (PF) 2 MG/ML IV SOLN
1.0000 mg | Freq: Once | INTRAVENOUS | Status: AC
  Administered 2024-07-17: 1 mg via INTRAVENOUS
  Filled 2024-07-17: qty 1

## 2024-07-17 MED ORDER — MAGNESIUM SULFATE 2 GM/50ML IV SOLN
2.0000 g | Freq: Once | INTRAVENOUS | Status: AC
  Administered 2024-07-17: 2 g via INTRAVENOUS
  Filled 2024-07-17: qty 50

## 2024-07-17 MED ORDER — LIDOCAINE 5 % EX PTCH
1.0000 | MEDICATED_PATCH | Freq: Every day | CUTANEOUS | Status: DC
  Administered 2024-07-17 – 2024-08-28 (×38): 1 via TRANSDERMAL
  Filled 2024-07-17 (×39): qty 1

## 2024-07-17 MED ORDER — MORPHINE SULFATE (PF) 2 MG/ML IV SOLN
2.0000 mg | INTRAVENOUS | Status: DC | PRN
  Administered 2024-07-17 (×2): 2 mg via INTRAVENOUS
  Filled 2024-07-17 (×2): qty 1

## 2024-07-17 MED ORDER — INSULIN ASPART 100 UNIT/ML IJ SOLN
0.0000 [IU] | Freq: Three times a day (TID) | INTRAMUSCULAR | Status: DC
  Administered 2024-07-17 – 2024-07-18 (×4): 1 [IU] via SUBCUTANEOUS
  Administered 2024-07-19: 3 [IU] via SUBCUTANEOUS
  Administered 2024-07-19: 4 [IU] via SUBCUTANEOUS
  Administered 2024-07-19 – 2024-07-20 (×2): 3 [IU] via SUBCUTANEOUS
  Filled 2024-07-17: qty 1
  Filled 2024-07-17: qty 3
  Filled 2024-07-17 (×2): qty 1
  Filled 2024-07-17 (×2): qty 3
  Filled 2024-07-17: qty 1
  Filled 2024-07-17: qty 3

## 2024-07-17 MED ORDER — INSULIN GLARGINE-YFGN 100 UNIT/ML ~~LOC~~ SOLN
8.0000 [IU] | Freq: Every day | SUBCUTANEOUS | Status: DC
  Administered 2024-07-18 – 2024-07-21 (×4): 8 [IU] via SUBCUTANEOUS
  Filled 2024-07-17 (×4): qty 0.08

## 2024-07-17 NOTE — Evaluation (Signed)
 Clinical/Bedside Swallow Evaluation Patient Details  Name: Brenda [name] MRN: [id number] Date of Birth: 1961/05/23  Today's Date: 07/17/2024 Time: SLP Start Time (ACUTE ONLY): 0901 SLP Stop Time (ACUTE ONLY): 0930 SLP Time Calculation (min) (ACUTE ONLY): 29 min  Past Medical History:  Past Medical History:  Diagnosis Date   Anemia    Arthritis    back, hands, hips (02/22/2015)   CAD (coronary artery disease)    a. complex LAD/diagonal bifurcation PCI in 2010. b. STEMI 10/2019 s/p PTCA/DES x1 to mLAD overlapping the old stent, residual disease treated medicaly.   Cancer [facility])    uterine   CHF (congestive heart failure) (HCC)    Depression    Diabetes mellitus (HCC)    started when I was pregnant; not sure if it was type 1 or type 2    History of radiation therapy    Endometrium- HDR 01/22/22-03/07/22- Dr. Lynwood Nasuti   Hypercholesterolemia    Hypertension    Morbid obesity (HCC)    Myocardial infarction (HCC)    mild x 3   Neuromuscular disorder (HCC)    neuropathy feet   Sleep apnea    cpap/   Past Surgical History:  Past Surgical History:  Procedure Laterality Date   CARDIAC CATHETERIZATION  12/02/2012   CHOLECYSTECTOMY OPEN  09/04/1987   CORONARY ANGIOPLASTY WITH STENT PLACEMENT  09/03/2009   CORONARY/GRAFT ACUTE MI REVASCULARIZATION N/A 10/26/2019   Procedure: CORONARY/GRAFT ACUTE MI REVASCULARIZATION;  Surgeon: Verlin Lonni BIRCH, MD;  Location: [facility] INVASIVE CV LAB;  Service: Cardiovascular;  Laterality: N/A;   INCISIONAL HERNIA REPAIR N/A 12/09/2021   Procedure: LAPAROSCOPIC REPAIR UMBILICAL HERNIA WITH MESH;  Surgeon: Vernetta Berg, MD;  Location: [facility] ORS;  Service: General;  Laterality: N/A;   IR IMAGING GUIDED PORT INSERTION  04/08/2024   LEFT HEART CATH AND CORONARY ANGIOGRAPHY N/A 10/26/2019   Procedure: LEFT HEART CATH AND CORONARY ANGIOGRAPHY;  Surgeon: Verlin Lonni BIRCH, MD;  Location: [facility] INVASIVE CV LAB;  Service: Cardiovascular;   Laterality: N/A;   LEFT HEART CATHETERIZATION WITH CORONARY ANGIOGRAM N/A 12/25/2012   Procedure: LEFT HEART CATHETERIZATION WITH CORONARY ANGIOGRAM;  Surgeon: Ozell Fell, MD;  Location: [facility] CATH LAB;  Service: Cardiovascular;  Laterality: N/A;   RADIOLOGY WITH ANESTHESIA N/A 04/21/2024   Procedure: MRI WITH ANESTHESIA;  Surgeon: Radiologist, Medication, MD;  Location: [facility] OR;  Service: Radiology;  Laterality: N/A;  BRAIN W/ W/O   ROBOTIC ASSISTED TOTAL HYSTERECTOMY WITH BILATERAL SALPINGO OOPHERECTOMY N/A 12/06/2021   Procedure: XI ROBOTIC ASSISTED TOTAL HYSTERECTOMY WITH BILATERAL SALPINGO OOPHORECTOMY;  Surgeon: [name] SAUNDERS, MD;  Location: [facility] ORS;  Service: Gynecology;  Laterality: N/A;   UMBILICAL HERNIA REPAIR  05/04/1989   doesn't think they used mesh   HPI:  Brenda [name] is a 63 y.o. 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  She presents to [facility] ED with reports of chills and weakness preventing her from ambulating.  Note she was recently discharged from [facility] where she was treated for a UTI and discharged on cefadroxil . Dx with recurrent UTI secondary to failed antibiotic attempt.    Assessment / Plan / Recommendation  Clinical Impression  Patient presents with a mild oropharyngeal dysphagia secondary to cognitive deficits. Increased confusion with acute UTI resulting in decreased sustained attention and awareness.  With SLP verbal, visual, and tactile cueing however, including HOH assist with self feeding and verbal instruction, patient able to consume all solids, thin and nectar thick liquids via straw sip (again to increase patient control of bolus) without overt s/s of aspiration. Mastication  prolonged with regular texture solids but eventually bolus cleared. Provided education to patient's daughters regarding findings and recommendations. If patient can be assisted carefully with meals, ensuring fully alert state and cueing for improved awareness of activity and bolus, dysphagia 3 solids and thin liquids continue to be appropriate. If coughing persists, recommend RN or MD downgrade to nectar thick liquids to decrease aspiration risk as cognitive function flucutates with return to thin liquid and regular texture solids once back to baseline and consuming with decreased overt indication of aspiration. SLP will plan to f/u 11/17. Please contact SLP via secure chat if needed prior. SLP Visit Diagnosis: Dysphagia, oropharyngeal phase (R13.12)    Aspiration Risk  Mild aspiration risk    Diet Recommendation Dysphagia 3 (Mech soft);Thin liquid    Liquid Administration via: Straw;Cup (straw will facilitate increased awareness of bolus) Medication Administration: Crushed with puree Supervision: Staff to assist with self feeding;Full supervision/cueing for compensatory strategies Compensations: Slow rate;Small sips/bites Postural Changes: Seated upright at 90 degrees    Other  Recommendations Oral Care Recommendations: Oral care BID        Functional Status Assessment Patient has had a recent decline in their functional status and demonstrates the ability to make significant improvements in function in a reasonable and predictable amount of time.  Frequency and Duration min 2x/week  1 week       Prognosis Prognosis for improved oropharyngeal function: Good Barriers to Reach Goals: Cognitive deficits      Swallow Study   General HPI: Brenda [name] is a 63 y.o. year old female with past medical history of  hypertension, hyperlipidemia, diabetes mellitus, depression, anxiety, CVA, history of DVT and PE anticoagulated on Lovenox , OSA, chronic hypoxic respiratory failure on 2L via  nasal cannula, metastatic endometrial cancer, and chronic cancer-related pain.  She presents to [facility] ED with reports of chills and weakness preventing her from ambulating.  Note she was recently discharged from [facility] where she was treated for a UTI and discharged on cefadroxil . Dx with recurrent UTI secondary to failed antibiotic attempt. Type of Study: Bedside Swallow Evaluation Previous Swallow Assessment: Multiple swallow evaluation since 04/2024 all with recommendations for a regular diet, thin liquid. Diet Prior to this Study: Regular;Thin liquids (Level 0) Temperature Spikes Noted: Yes Respiratory Status: Nasal cannula History of Recent Intubation: No Behavior/Cognition: Alert;Cooperative;Pleasant mood;Confused;Requires cueing Oral Cavity Assessment: Within Functional Limits Oral Care Completed by SLP: No Oral Cavity - Dentition: Adequate natural dentition Vision: Functional for self-feeding Self-Feeding Abilities: Able to feed self;Needs assist Patient Positioning: Upright in bed Baseline Vocal Quality: Normal Volitional Cough: Strong Volitional Swallow: Able to elicit    Oral/Motor/Sensory Function Overall Oral Motor/Sensory Function: Within functional limits   Ice Chips Ice chips: Within functional limits Presentation: Spoon   Thin Liquid Thin Liquid: Within functional limits Presentation: Straw    Nectar Thick Nectar Thick Liquid: Not tested   Honey Thick Honey Thick Liquid: Not tested   Puree Puree: Within functional limits Presentation: Spoon   Solid     Solid: Impaired Oral Phase Functional Implications: Prolonged oral transit     Brenda Sliwinski MA, CCC-SLP  Brenda [name] 07/17/2024,9:37 AM

## 2024-07-17 NOTE — Progress Notes (Signed)
 PROGRESS NOTE    Brenda [name]  FMW:[id number] DOB: [birth year] DOA: 07/15/2024 PCP: [facility]   Brief Narrative:  63 year old female with history of metastatic endometrial cancer, chronic cancer-related pain, hypertension, hyperlipidemia, diabetes mellitus, depression, anxiety, CVA, history of DVT and PE anticoagulated on Lovenox , OSA, chronic hypoxic respiratory failure on 2L via nasal cannula and recent hospitalization and discharged on 07/14/2024 for UTI treated with IV antibiotics followed by oral antibiotics on discharge presented with chills and weakness.  She was found to have UTI and was started on IV antibiotics.  Assessment & Plan:   UTI: Present on admission, failed p.o. antibiotics - Blood cultures negative so far.  Follow urine cultures.  Continue IV cefepime .  Endometrial cancer with metastases Chronic cancer-related pain with opiate dependence - Outpatient follow-up with oncology/Dr. Lonn.  Continue current pain management.  Chronic respiratory failure with hypoxia OSA - Respiratory status stable.  Continue supplemental oxygen .  Continue CPAP at night  History of PE and DVT - Continue Lovenox   Diabetes mellitus type 2 with intermittent hypoglycemia - Decrease long-acting insulin .  Carb modified diet.  CBGs with SSI.  Hypomagnesemia - Replace.  Repeat a.m. labs  Anemia of chronic disease - From cancer.  Hemoglobin stable.  No signs of bleeding.  Monitor intermittently  Depression/anxiety - Continue Prozac , Remeron, Haldol and as needed hydroxyzine  History of CAD History of stroke Hyperlipidemia -Continue statin.  Not on aspirin  since she is on treatment dose Lovenox .  Obesity class I - Outpatient follow-up  Physical deconditioning - PT eval   DVT prophylaxis: Lovenox  therapeutic dosing Code Status: DNR Family Communication: Daughters at bedside  disposition Plan: Status is: Observation The patient will require  care spanning > 2 midnights and should be moved to inpatient because: Of need for IV antibiotics    Consultants: None  Procedures: None  Antimicrobials: Cefepime  from 07/16/2024 onwards   Subjective: Patient seen and examined at bedside.  Feels very weak.  No fever, vomiting, worsening abdominal pain reported.  Objective: Vitals:   07/16/24 1922 07/16/24 2121 07/17/24 0312 07/17/24 0646  BP:  (!) 144/73 (!) 142/53 (!) 155/90  Pulse: 77 82 88 98  Resp: 16 14 16 18   Temp:  97.8 F (36.6 C) 98.4 F (36.9 C) 99 F (37.2 C)  TempSrc:  Oral Oral Oral  SpO2: 99% 100% 99% 100%  Weight:      Height:        Intake/Output Summary (Last 24 hours) at 07/17/2024 0756 Last data filed at 07/17/2024 0353 Gross per 24 hour  Intake 100 ml  Output 850 ml  Net -750 ml   Filed Weights   07/16/24 1546  Weight: 77.9 kg    Examination:  General exam: Appears calm and comfortable.  Looks chronically ill and deconditioned Respiratory system: Bilateral decreased breath sounds at bases Cardiovascular system: S1 & S2 heard, Rate controlled Gastrointestinal system: Abdomen is nondistended, soft and nontender. Normal bowel sounds heard. Extremities: No cyanosis, clubbing, edema  Central nervous system: Awake, very slow to respond.  Poor historian.  No focal neurological deficits. Moving extremities Skin: No rashes, lesions or ulcers Psychiatry: Flat affect.  Not agitated   Data Reviewed: I have personally reviewed following labs and imaging studies  CBC: Recent Labs  Lab 07/12/24 2313 07/13/24 0733 07/14/24 0450 07/16/24 0200 07/17/24 0600  WBC 4.0 3.7* 4.1 3.5* 4.2  NEUTROABS 2.3  --   --  2.1  --   HGB 9.3* 8.7* 8.9* 9.5*  9.3*  HCT 30.1* 29.3* 28.5* 29.7* 29.9*  MCV 86.0 86.9 85.3 84.9 86.2  PLT 258 258 259 281 253   Basic Metabolic Panel: Recent Labs  Lab 07/12/24 2313 07/13/24 0733 07/14/24 0450 07/16/24 0200 07/17/24 0600  NA 134* 138 138 137 140  K 4.3 4.1 4.2  4.3 4.3  CL 100 103 102 102 104  CO2 22 23 26 26 27   GLUCOSE 183* 93 187* 238* 100*  BUN 9 9 8 9 9   CREATININE 0.54 0.49 0.48 0.56 0.61  CALCIUM  9.1 9.3 9.2 9.6 9.4  MG  --   --   --   --  1.6*   GFR: Estimated Creatinine Clearance: 71.3 mL/min (by C-G formula based on SCr of 0.61 mg/dL). Liver Function Tests: Recent Labs  Lab 07/12/24 2313 07/16/24 0200  AST 24 22  ALT 24 23  ALKPHOS 70 83  BILITOT 0.2 0.2  PROT 6.8 6.8  ALBUMIN 3.0* 3.5   No results for input(s): LIPASE, AMYLASE in the last 168 hours. No results for input(s): AMMONIA in the last 168 hours. Coagulation Profile: Recent Labs  Lab 07/12/24 2313 07/16/24 0200  INR 1.0 1.0   Cardiac Enzymes: No results for input(s): CKTOTAL, CKMB, CKMBINDEX, TROPONINI in the last 168 hours. BNP (last 3 results) Recent Labs    05/22/24 0630  PROBNP 3,514.0*   HbA1C: No results for input(s): HGBA1C in the last 72 hours. CBG: Recent Labs  Lab 07/16/24 1714 07/16/24 1756 07/16/24 2123 07/16/24 2302 07/17/24 0727  GLUCAP 62* 114* 92 93 112*   Lipid Profile: No results for input(s): CHOL, HDL, LDLCALC, TRIG, CHOLHDL, LDLDIRECT in the last 72 hours. Thyroid  Function Tests: No results for input(s): TSH, T4TOTAL, FREET4, T3FREE, THYROIDAB in the last 72 hours. Anemia Panel: No results for input(s): VITAMINB12, FOLATE, FERRITIN, TIBC, IRON, RETICCTPCT in the last 72 hours. Sepsis Labs: Recent Labs  Lab 07/12/24 2309 07/16/24 0207  LATICACIDVEN 0.6 0.6    Recent Results (from the past 240 hours)  Blood Culture (routine x 2)     Status: None   Collection Time: 07/12/24 11:05 PM   Specimen: BLOOD  Result Value Ref Range Status   Specimen Description BLOOD LEFT ANTECUBITAL  Final   Special Requests   Final    BOTTLES DRAWN AEROBIC AND ANAEROBIC Blood Culture adequate volume   Culture   Final    NO GROWTH 5 DAYS Performed at [facility], [address], [place], [place] [address]    Report Status 07/17/2024 FINAL  Final  Urine Culture     Status: Abnormal   Collection Time: 07/12/24 11:14 PM   Specimen: Urine, Random  Result Value Ref Range Status   Specimen Description URINE, RANDOM  Final   Special Requests   Final    NONE Reflexed from [id number] Performed at [facility], [address] [place], [place] [address]    Culture >=100,000 COLONIES/mL ENTEROBACTER CLOACAE (A)  Final   Report Status 07/15/2024 FINAL  Final   Organism ID, Bacteria ENTEROBACTER CLOACAE (A)  Final      Susceptibility   Enterobacter cloacae - MIC*    CEFEPIME  2 SENSITIVE Sensitive     CIPROFLOXACIN  <=0.06 SENSITIVE Sensitive     GENTAMICIN <=1 SENSITIVE Sensitive     NITROFURANTOIN 32 SENSITIVE Sensitive     TRIMETH /SULFA  <=20 SENSITIVE Sensitive     PIP/TAZO Value in next row Resistant      >=128 RESISTANTThis is a modified  FDA-approved test that has been validated and its performance characteristics determined by the reporting laboratory.  This laboratory is certified under the Clinical Laboratory Improvement Amendments CLIA as qualified to perform high complexity clinical laboratory testing.    MEROPENEM Value in next row Sensitive      >=128 RESISTANTThis is a modified FDA-approved test that has been validated and its performance characteristics determined by the reporting laboratory.  This laboratory is certified under the Clinical Laboratory Improvement Amendments CLIA as qualified to perform high complexity clinical laboratory testing.    * >=100,000 COLONIES/mL ENTEROBACTER CLOACAE  Blood Culture (routine x 2)     Status: None (Preliminary result)   Collection Time: 07/12/24 11:41 PM   Specimen: BLOOD  Result Value Ref Range Status   Specimen Description BLOOD RIGHT ANTECUBITAL  Final   Special Requests   Final    BOTTLES DRAWN AEROBIC AND ANAEROBIC Blood Culture results may not be optimal due to an inadequate volume of blood received in culture  bottles   Culture   Final    NO GROWTH 4 DAYS Performed at [facility], [address], [place], [place] [address]    Report Status PENDING  Incomplete  Blood Culture (routine x 2)     Status: None (Preliminary result)   Collection Time: 07/16/24  2:00 AM   Specimen: BLOOD  Result Value Ref Range Status   Specimen Description   Final    BLOOD RIGHT ANTECUBITAL Performed at [facility], [address], [place], [place] [address]    Special Requests   Final    Blood Culture adequate volume BOTTLES DRAWN AEROBIC AND ANAEROBIC Performed at [facility], [address] [place], [place] [address]    Culture   Final    NO GROWTH 1 DAY Performed at [facility], [address] [place], [place] [address]    Report Status PENDING  Incomplete  Blood Culture (routine x 2)     Status: None (Preliminary result)   Collection Time: 07/16/24  2:00 AM   Specimen: BLOOD  Result Value Ref Range Status   Specimen Description   Final    BLOOD LEFT ANTECUBITAL Performed at [facility], [address], [place], [place] [address]    Special Requests   Final    Blood Culture adequate volume BOTTLES DRAWN AEROBIC AND ANAEROBIC Performed at [facility], [address], [place], [place] [address]    Culture   Final    NO GROWTH 1 DAY Performed at [facility], [address], [place], [place] [address]    Report Status PENDING  Incomplete         Radiology Studies: DG Chest Port 1 View Result Date: 07/16/2024 EXAM: 1 VIEW(S) XRAY OF THE CHEST 07/16/2024 01:29:00 AM COMPARISON: 07/12/2024. CLINICAL HISTORY: Questionable sepsis - evaluate for abnormality. FINDINGS: LINES, TUBES AND DEVICES: Right chest port terminates at the cavoatrial junction. LUNGS AND PLEURA: No focal pulmonary opacity. No pleural effusion. No pneumothorax. HEART AND MEDIASTINUM: No acute abnormality of the cardiac and  mediastinal silhouettes. BONES AND SOFT TISSUES: No acute osseous abnormality. IMPRESSION: 1. No acute findings. Electronically signed by: Pinkie Pebbles MD 07/16/2024 01:32 AM EST RP Workstation: [id number]        Scheduled Meds:  atorvastatin   80 mg Oral QPM   enoxaparin   1 mg/kg Subcutaneous BID   FLUoxetine   40 mg Oral Daily   haloperidol  2 mg Oral  BID   insulin  aspart  0-15 Units Subcutaneous TID WC   insulin  aspart  0-5 Units Subcutaneous QHS   insulin  glargine-yfgn  18 Units Subcutaneous Daily   mirtazapine  15 mg Oral QHS   pregabalin   50 mg Oral TID   Continuous Infusions:  ceFEPime  (MAXIPIME ) IV 2 g (07/17/24 0048)          Sophie Mao, MD [facility] 07/17/2024, 7:56 AM

## 2024-07-17 NOTE — Telephone Encounter (Signed)
 ATCx1 LVMTCB.  LVM asking to bring CPAP machine with her to her appt with Tammy Parrett on 07/20/24.  Sending MyChart message.

## 2024-07-17 NOTE — Evaluation (Signed)
 Physical Therapy Evaluation Patient Details Name: Brenda [name] MRN: [id number] DOB: 1960-10-09 Today's Date: 07/17/2024  History of Present Illness  Brenda [name] is a 63 yo female who returned to hospital on 07/15/2024 with ongoing signs and symptoms of UTI with generalized weakness impacting pt functional mobility. Pt presented on 11/9 with urinary symptoms and AMS d/c home on OP ABX. Pt has had several prior hospitalizations this year.  Pt PMH includes but is not limited to: HTN, HLD, DM, depression/anxiety, CVA (CIR recently), DVT/PE, OSA on CPAP, chronic respiratory failure on 2 L/min at baseline, and metastatic endometrial cancer with chronic cancer-related pain.  Clinical Impression    Pt admitted with above diagnosis.  Pt currently with functional limitations due to the deficits listed below (see PT Problem List). Pt in bed when PT arrived. Daughter present. Pt required some encouragement for participation with therapy due to fear of falling and systemic pain. Pt required max A for supine to sit with use of hospital bed features, pt required min A and progressing to close S with facilitation for attention to midline for static sitting EOB, pt exhibits R and upward gaze preference and is able to scan and attend to midline with cues and L eye visual deficits at baseline, pt required total A for supine to sit, mod A to roll side to side and total A x 2 for repositioning in bed. Pt left in bed, all needs in place and daughter present. Pt presents with a significant functional decline over the past 4 months with this PT conducting eval in July in which pt tolerated gait distance of 150 feet with RW and CGA on 2 L/min. Family preference to have pt d/c home however unclear if family can safely provide required assist and PT recommending continued inpatient follow up therapy, <3 hours/day at this time.   Pt will benefit from acute skilled PT to increase their independence and safety with mobility to  allow discharge.         If plan is discharge home, recommend the following: A little help with walking and/or transfers;Assistance with cooking/housework;Assist for transportation;Help with stairs or ramp for entrance;Two people to help with walking and/or transfers;A lot of help with bathing/dressing/bathroom   Can travel by private vehicle   No    Equipment Recommendations Hospital bed  Recommendations for Other Services       Functional Status Assessment Patient has had a recent decline in their functional status and demonstrates the ability to make significant improvements in function in a reasonable and predictable amount of time.     Precautions / Restrictions Precautions Precautions: Fall Recall of Precautions/Restrictions: Intact Precaution/Restrictions Comments: low vision (L eye blind) (gaze preferance R) Restrictions Weight Bearing Restrictions Per Provider Order: No      Mobility  Bed Mobility Overal bed mobility: Needs Assistance Bed Mobility: Supine to Sit, Sit to Supine, Rolling Rolling: Mod assist   Supine to sit: HOB elevated, Max assist Sit to supine: Total assist   General bed mobility comments: pt required increased time, use of hospital bed and extensive assist for supine <> sit with limited initiation of movement, mod A to roll side to side and total A x 2 to reposition in bed    Transfers                   General transfer comment: NT due to extensive assist for supine <> sit, pt pain report and fear of falling    Ambulation/Gait  General Gait Details: NT  Stairs            Wheelchair Mobility     Tilt Bed    Modified Rankin (Stroke Patients Only)       Balance Overall balance assessment: Needs assistance Sitting-balance support: Feet supported, Bilateral upper extremity supported Sitting balance-Leahy Scale: Fair Sitting balance - Comments: not challenged (CGA to close S once midline  facilitated)     Standing balance-Leahy Scale: Zero Standing balance comment: NT                             Pertinent Vitals/Pain Pain Assessment Pain Assessment: Faces Faces Pain Scale: Hurts whole lot Breathing: normal Negative Vocalization: none Facial Expression: sad, frightened, frown Body Language: tense, distressed pacing, fidgeting Consolability: no need to console PAINAD Score: 2 Pain Location: all over primarily back Pain Descriptors / Indicators: Aching, Constant, Discomfort, Dull, Grimacing Pain Intervention(s): Limited activity within patient's tolerance, Monitored during session    Home Living Family/patient expects to be discharged to:: Private residence Living Arrangements: Spouse/significant other Available Help at Discharge: Family;Available 24 hours/day Type of Home: House Home Access: Ramped entrance       Home Layout: One level Home Equipment: Rollator (4 wheels);BSC/3in1;Shower seat;Grab bars - toilet;Grab bars - tub/shower;Wheelchair - manual;Hand held shower head;Adaptive equipment Additional Comments: baseline 2 ltrs O2, use of rollator in home, w/c for MD appts, ramp not sufficient as husband having difficulty with mngt, was recieving HH therapy services and was recently d/c'd CIR    Prior Function Prior Level of Function : Needs assist;History of Falls (last six months)       Physical Assist : Mobility (physical);ADLs (physical)     Mobility Comments: Since time of d/c from hospital most recent hospitalization 11/9 pt has been primarily bed bound, daughter indicated pt has been able to get OOB with heavy assist to wc to recliner x 1 and unable to safely navigate in home with rollator to the bathroom.  Prior to November 2025: use of rollator in home, w/c for community access with assist, having issues on ramp and for bed mobility as well as w/c and rollator access into bathroom. ADLs Comments: family reports pt requires extensive assist  for all ADLs, and self care tasks. prior to November 2025: relatively mod I prior for BADL's except showers and assist with all IADL's from family     Extremity/Trunk Assessment             Cervical / Trunk Assessment Cervical / Trunk Assessment: Normal  Communication   Communication Communication: Impaired Factors Affecting Communication: Difficulty expressing self (intermittently)    Cognition Arousal: Alert Behavior During Therapy: Flat affect   PT - Cognitive impairments: Attention, Initiation (increased time for verbal response)                       PT - Cognition Comments: vision deficits Following commands: Impaired Following commands impaired: Follows one step commands inconsistently, Follows one step commands with increased time     Cueing Cueing Techniques: Verbal cues, Tactile cues, Visual cues, Gestural cues     General Comments General comments (skin integrity, edema, etc.): 2 L/min throughout intervention, no reports of dizziness nor SOB    Exercises     Assessment/Plan    PT Assessment Patient needs continued PT services  PT Problem List Decreased strength;Decreased activity tolerance;Decreased balance;Decreased mobility;Decreased knowledge of use of DME;Decreased knowledge of  precautions       PT Treatment Interventions DME instruction;Gait training;Functional mobility training;Therapeutic activities;Therapeutic exercise;Balance training;Neuromuscular re-education;Cognitive remediation;Patient/family education    PT Goals (Current goals can be found in the Care Plan section)  Acute Rehab PT Goals Patient Stated Goal: to no longer be in pain PT Goal Formulation: With patient Time For Goal Achievement: 07/31/24 Potential to Achieve Goals: Fair    Frequency Min 2X/week     Co-evaluation               AM-PAC PT 6 Clicks Mobility  Outcome Measure Help needed turning from your back to your side while in a flat bed without using  bedrails?: A Lot Help needed moving from lying on your back to sitting on the side of a flat bed without using bedrails?: Total Help needed moving to and from a bed to a chair (including a wheelchair)?: Total Help needed standing up from a chair using your arms (e.g., wheelchair or bedside chair)?: Total Help needed to walk in hospital room?: Total Help needed climbing 3-5 steps with a railing? : Total 6 Click Score: 7    End of Session Equipment Utilized During Treatment: Oxygen  Activity Tolerance: Patient limited by pain;Patient limited by fatigue Patient left: in bed;with call bell/phone within reach;with family/visitor present Nurse Communication: Mobility status PT Visit Diagnosis: Other abnormalities of gait and mobility (R26.89);Muscle weakness (generalized) (M62.81);Unsteadiness on feet (R26.81);History of falling (Z91.81)    Time: 8967-8942 PT Time Calculation (min) (ACUTE ONLY): 25 min   Charges:   PT Evaluation $PT Eval Moderate Complexity: 1 Mod PT Treatments $Therapeutic Activity: 8-22 mins PT General Charges $$ ACUTE PT VISIT: 1 Visit         [place], PT Acute Rehab   [place] VEAR Drone 07/17/2024, 12:30 PM

## 2024-07-17 NOTE — Inpatient Diabetes Management (Signed)
 Inpatient Diabetes Program Recommendations  AACE/ADA: New Consensus Statement on Inpatient Glycemic Control (2015)  Target Ranges:  Prepandial:   less than 140 mg/dL      Peak postprandial:   less than 180 mg/dL (1-2 hours)      Critically ill patients:  140 - 180 mg/dL   Lab Results  Component Value Date   GLUCAP 112 (H) 07/17/2024   HGBA1C 7.2 (H) 06/01/2024    Review of Glycemic Control  Latest Reference Range & Units 07/16/24 12:36 07/16/24 17:14 07/16/24 17:56 07/16/24 21:23 07/16/24 23:02 07/17/24 07:27  Glucose-Capillary 70 - 99 mg/dL 872 (H) 62 (L) 885 (H) 92 93 112 (H)  (H): Data is abnormally high (L): Data is abnormally low  Diabetes history: DM2 Outpatient Diabetes medications: Tresiba 18 units every day, Novolog  1-3 units TID, Metformin 1000 mg BID Current orders for Inpatient glycemic control: Semglee  18 units every day, Novolog  0-9 units TID  Inpatient Diabetes Program Recommendations:    Please consider decreasing basal insulin  and correction due to glucose trends running below hospital goal.    Semglee  8 units every day Novolog  0-6 units TID  Thank you, Wyvonna Pinal, MSN, CDCES Diabetes Coordinator Inpatient Diabetes Program [phone] (team pager from 8a-5p)

## 2024-07-17 NOTE — Progress Notes (Signed)
   07/17/24 2127  BiPAP/CPAP/SIPAP  BiPAP/CPAP/SIPAP Pt Type Adult  BiPAP/CPAP/SIPAP Resmed  Mask Type Full face mask  Dentures removed? Not applicable  Mask Size Medium  EPAP 14 cmH2O  Flow Rate 2 lpm  Heater Temperature  (Humidifier filled with sterile water)  Patient Home Machine No  Patient Home Mask No  Patient Home Tubing No  Auto Titrate No  CPAP/SIPAP surface wiped down Yes  Device Plugged into RED Power Outlet Yes  BiPAP/CPAP /SiPAP Vitals  Pulse Rate 78  Resp 18  SpO2 100 %  Bilateral Breath Sounds Clear  MEWS Score/Color  MEWS Score 0  MEWS Score Color Landy

## 2024-07-18 ENCOUNTER — Inpatient Hospital Stay (HOSPITAL_COMMUNITY)

## 2024-07-18 DIAGNOSIS — N390 Urinary tract infection, site not specified: Secondary | ICD-10-CM | POA: Diagnosis not present

## 2024-07-18 LAB — CBC WITH DIFFERENTIAL/PLATELET
Abs Immature Granulocytes: 0.03 K/uL (ref 0.00–0.07)
Basophils Absolute: 0 K/uL (ref 0.0–0.1)
Basophils Relative: 0 %
Eosinophils Absolute: 0 K/uL (ref 0.0–0.5)
Eosinophils Relative: 1 %
HCT: 33.2 % — ABNORMAL LOW (ref 36.0–46.0)
Hemoglobin: 10.4 g/dL — ABNORMAL LOW (ref 12.0–15.0)
Immature Granulocytes: 1 %
Lymphocytes Relative: 18 %
Lymphs Abs: 0.9 K/uL (ref 0.7–4.0)
MCH: 27 pg (ref 26.0–34.0)
MCHC: 31.3 g/dL (ref 30.0–36.0)
MCV: 86.2 fL (ref 80.0–100.0)
Monocytes Absolute: 0.4 K/uL (ref 0.1–1.0)
Monocytes Relative: 8 %
Neutro Abs: 3.4 K/uL (ref 1.7–7.7)
Neutrophils Relative %: 72 %
Platelets: 232 K/uL (ref 150–400)
RBC: 3.85 MIL/uL — ABNORMAL LOW (ref 3.87–5.11)
RDW: 16 % — ABNORMAL HIGH (ref 11.5–15.5)
WBC: 4.8 K/uL (ref 4.0–10.5)
nRBC: 0 % (ref 0.0–0.2)

## 2024-07-18 LAB — BASIC METABOLIC PANEL WITH GFR
Anion gap: 12 (ref 5–15)
BUN: 9 mg/dL (ref 8–23)
CO2: 24 mmol/L (ref 22–32)
Calcium: 9.4 mg/dL (ref 8.9–10.3)
Chloride: 103 mmol/L (ref 98–111)
Creatinine, Ser: 0.56 mg/dL (ref 0.44–1.00)
GFR, Estimated: >60 mL/min (ref >60)
Glucose, Bld: 189 mg/dL — ABNORMAL HIGH (ref 70–99)
Potassium: 4.4 mmol/L (ref 3.5–5.1)
Sodium: 139 mmol/L (ref 135–145)

## 2024-07-18 LAB — CULTURE, BLOOD (ROUTINE X 2): Culture: NO GROWTH

## 2024-07-18 LAB — GLUCOSE, CAPILLARY
Glucose-Capillary: 190 mg/dL — ABNORMAL HIGH (ref 70–99)
Glucose-Capillary: 177 mg/dL — ABNORMAL HIGH (ref 70–99)
Glucose-Capillary: 142 mg/dL — ABNORMAL HIGH (ref 70–99)
Glucose-Capillary: 171 mg/dL — ABNORMAL HIGH (ref 70–99)
Glucose-Capillary: 179 mg/dL — ABNORMAL HIGH (ref 70–99)

## 2024-07-18 LAB — MAGNESIUM: Magnesium: 1.8 mg/dL (ref 1.7–2.4)

## 2024-07-18 MED ORDER — ACETAMINOPHEN 10 MG/ML IV SOLN
1000.0000 mg | Freq: Once | INTRAVENOUS | Status: AC
  Administered 2024-07-18: 1000 mg via INTRAVENOUS
  Filled 2024-07-18: qty 100

## 2024-07-18 MED ORDER — OXYCODONE HCL 5 MG PO TABS
5.0000 mg | ORAL_TABLET | Freq: Four times a day (QID) | ORAL | Status: DC | PRN
  Administered 2024-07-22 – 2024-07-31 (×13): 5 mg via ORAL
  Filled 2024-07-18 (×14): qty 1

## 2024-07-18 MED ORDER — SODIUM CHLORIDE 0.9 % IV SOLN: INTRAVENOUS | Status: AC

## 2024-07-18 MED ORDER — CHLORHEXIDINE GLUCONATE CLOTH 2 % EX PADS
6.0000 | MEDICATED_PAD | Freq: Every day | CUTANEOUS | Status: AC
  Administered 2024-07-18 – 2024-10-09 (×84): 6 via TOPICAL

## 2024-07-18 MED ORDER — FENTANYL CITRATE (PF) 50 MCG/ML IJ SOSY: 12.5000 ug | PREFILLED_SYRINGE | INTRAMUSCULAR | Status: DC | PRN

## 2024-07-18 MED ORDER — LORAZEPAM 2 MG/ML IJ SOLN
1.0000 mg | Freq: Once | INTRAMUSCULAR | Status: AC
  Administered 2024-07-18: 1 mg via INTRAVENOUS
  Filled 2024-07-18: qty 1

## 2024-07-18 MED ORDER — HALOPERIDOL LACTATE 5 MG/ML IJ SOLN
2.0000 mg | Freq: Four times a day (QID) | INTRAMUSCULAR | Status: DC | PRN
  Administered 2024-07-18 – 2024-07-31 (×8): 2 mg via INTRAVENOUS
  Filled 2024-07-18 (×8): qty 1

## 2024-07-18 NOTE — Progress Notes (Signed)
 EEG not available at [facility] over the weekend. Mental status of the patient has not returned back to baseline yet. MRI brain didn't show any infarct. Discussed with Dr. Kirkpatrick/Neurology via secure chat: patient will be transferred to [facility] for possible LTM EEG.

## 2024-07-18 NOTE — Progress Notes (Signed)
 Blood pressure taken twice on both arms and is 189/72. Lavanda Horns, NP notified. Patient is complaining of pain, PRN Oxycodone  administered. Advised by NP to recheck blood pressure in one hour. One hour check at 628 was 176/59, NP made aware.

## 2024-07-18 NOTE — Plan of Care (Signed)
  Problem: Education: Goal: Ability to describe self-care measures that may prevent or decrease complications (Diabetes Survival Skills Education) will improve Outcome: Progressing   Problem: Fluid Volume: Goal: Ability to maintain a balanced intake and output will improve Outcome: Progressing   Problem: Health Behavior/Discharge Planning: Goal: Ability to identify and utilize available resources and services will improve Outcome: Progressing Goal: Ability to manage health-related needs will improve Outcome: Progressing   Problem: Metabolic: Goal: Ability to maintain appropriate glucose levels will improve Outcome: Progressing   Problem: Health Behavior/Discharge Planning: Goal: Ability to manage health-related needs will improve Outcome: Progressing   Problem: Coping: Goal: Level of anxiety will decrease Outcome: Progressing   Problem: Pain Managment: Goal: General experience of comfort will improve and/or be controlled Outcome: Progressing   Problem: Safety: Goal: Ability to remain free from injury will improve Outcome: Progressing

## 2024-07-18 NOTE — Progress Notes (Signed)
 Patient has arrived from [facility] to 3W06. Patient is only alert to self and unable to follow commands. Telemetry applied and vitals completed.

## 2024-07-18 NOTE — Progress Notes (Signed)
 Foley catheter placement attempted with no urine output, likely due to post I&O. Advised oncoming RN to re-attempt.

## 2024-07-18 NOTE — Significant Event (Signed)
 Rapid Response Event Note   Reason for Call : Alerted mental status    Initial Focused Assessment:  Patient awake, slow to respond: answering most questions appriopatley noted to have extremity tremors and stiffing of extremeities   Interventions:  EKG EEG stat MRI of head  CT of head    Event Summary: EEG not available at [facility] on weekend see Dr. Cheryle note. Order to transfer to [facility] @ 1715   MD Notified:  Dr. Cheryle 0800 Call Time:  0730 Arrival Time: 0740 End Time:   Heron PARAS Darriana Deboy, RN

## 2024-07-18 NOTE — Progress Notes (Signed)
 PROGRESS NOTE    Brenda [name]  FMW:[id number] DOB: [birth year] DOA: 07/15/2024 PCP: [facility]   Brief Narrative:  63 year old female with history of metastatic endometrial cancer, chronic cancer-related pain, hypertension, hyperlipidemia, diabetes mellitus, depression, anxiety, CVA, history of DVT and PE anticoagulated on Lovenox , OSA, chronic hypoxic respiratory failure on 2L via nasal cannula and recent hospitalization and discharged on 07/14/2024 for UTI treated with IV antibiotics followed by oral antibiotics on discharge presented with chills and weakness.  She was found to have UTI and was started on IV antibiotics.  Assessment & Plan:  Acute metabolic encephalopathy -Patient has had intermittent confusion since admission and has been slow to respond.  Patient apparently more altered this morning with some upper extremity tremors and stiffening of upper extremities.  She is awake and answers questions.  Will get CT of the head and MRI of the head stat.  EEG.  Monitor mental status.  Fall precautions.  I have DC'd Prozac , Remeron, Haldol for now.  UTI: Present on admission, failed p.o. antibiotics - Blood and urine cultures negative so far.  Continue IV cefepime .  Endometrial cancer with metastases Chronic cancer-related pain with opiate dependence - Outpatient follow-up with oncology/Dr. Lonn.  Continue current pain management.  Chronic respiratory failure with hypoxia OSA - Respiratory status stable.  Continue supplemental oxygen .  Continue CPAP at night  History of PE and DVT - Continue Lovenox   Diabetes mellitus type 2 with intermittent hypoglycemia - Continue long-acting insulin .  Carb modified diet.  CBGs with SSI.  Hypomagnesemia - Resolved Anemia of chronic disease - From cancer.  Hemoglobin stable.  No signs of bleeding.  Monitor intermittently  Depression/anxiety - Prozac , Remeron, Haldol discontinued as discussed above.     History of CAD History of stroke Hyperlipidemia -Continue statin.  Not on aspirin  since she is on treatment dose Lovenox .  Obesity class I - Outpatient follow-up  Physical deconditioning - PT recommended SNF.  Consult TOC  DVT prophylaxis: Lovenox  therapeutic dosing Code Status: DNR Family Communication: Daughters at bedside  disposition Plan: Status is:  inpatient because: Severity of illness    Consultants: None  Procedures: None  Antimicrobials: Cefepime  from 07/16/2024 onwards   Subjective: Patient seen and examined at bedside.  Rapid response team was at bedside because of concern for altered mental status.  Patient was awake, slow to respond but answering some questions and having upper extremity tremors with some stiffening of upper extremities.  No fever, vomiting, agitation reported. Objective: Vitals:   07/17/24 2127 07/17/24 2139 07/18/24 0525 07/18/24 0628  BP:  (!) 173/81 (!) 189/72 (!) 176/59  Pulse: 78 94 (!) 103 99  Resp: 18 20 18    Temp:  (!) 97.5 F (36.4 C) 98 F (36.7 C)   TempSrc:  Oral Oral   SpO2: 100% 100% 91%   Weight:      Height:        Intake/Output Summary (Last 24 hours) at 07/18/2024 1134 Last data filed at 07/18/2024 9378 Gross per 24 hour  Intake 500 ml  Output 1450 ml  Net -950 ml   Filed Weights   07/16/24 1546  Weight: 77.9 kg    Examination:  General: On room air.  No distress.  Chronically- ENT/neck: No thyromegaly.  JVD is not elevated  respiratory: Decreased breath sounds at bases bilaterally with some crackles; no wheezing  CVS: S1-S2 heard, intermittently tachycardic  abdominal: Soft, obese, nontender, slightly distended; no organomegaly,  bowel sounds are heard Extremities:  Trace lower extremity edema; no cyanosis  CNS: awake, slow to respond but answering some questions and having upper extremity tremors with some stiffening of upper extremities.  Able to move extremities. Lymph: No obvious  lymphadenopathy Skin: No obvious ecchymosis/lesions  psych: Flat affect; not agitated  musculoskeletal: No obvious joint swelling/deformity    Data Reviewed: I have personally reviewed following labs and imaging studies  CBC: Recent Labs  Lab 07/12/24 2313 07/13/24 0733 07/14/24 0450 07/16/24 0200 07/17/24 0600 07/18/24 0554  WBC 4.0 3.7* 4.1 3.5* 4.2 4.8  NEUTROABS 2.3  --   --  2.1  --  3.4  HGB 9.3* 8.7* 8.9* 9.5* 9.3* 10.4*  HCT 30.1* 29.3* 28.5* 29.7* 29.9* 33.2*  MCV 86.0 86.9 85.3 84.9 86.2 86.2  PLT 258 258 259 281 253 232   Basic Metabolic Panel: Recent Labs  Lab 07/13/24 0733 07/14/24 0450 07/16/24 0200 07/17/24 0600 07/18/24 0554  NA 138 138 137 140 139  K 4.1 4.2 4.3 4.3 4.4  CL 103 102 102 104 103  CO2 23 26 26 27 24   GLUCOSE 93 187* 238* 100* 189*  BUN 9 8 9 9 9   CREATININE 0.49 0.48 0.56 0.61 0.56  CALCIUM  9.3 9.2 9.6 9.4 9.4  MG  --   --   --  1.6* 1.8   GFR: Estimated Creatinine Clearance: 71.3 mL/min (by C-G formula based on SCr of 0.56 mg/dL). Liver Function Tests: Recent Labs  Lab 07/12/24 2313 07/16/24 0200  AST 24 22  ALT 24 23  ALKPHOS 70 83  BILITOT 0.2 0.2  PROT 6.8 6.8  ALBUMIN 3.0* 3.5   No results for input(s): LIPASE, AMYLASE in the last 168 hours. No results for input(s): AMMONIA in the last 168 hours. Coagulation Profile: Recent Labs  Lab 07/12/24 2313 07/16/24 0200  INR 1.0 1.0   Cardiac Enzymes: No results for input(s): CKTOTAL, CKMB, CKMBINDEX, TROPONINI in the last 168 hours. BNP (last 3 results) Recent Labs    05/22/24 0630  PROBNP 3,514.0*   HbA1C: No results for input(s): HGBA1C in the last 72 hours. CBG: Recent Labs  Lab 07/17/24 0727 07/17/24 1135 07/17/24 1643 07/17/24 2140 07/18/24 0737  GLUCAP 112* 165* 162* 150* 190*   Lipid Profile: No results for input(s): CHOL, HDL, LDLCALC, TRIG, CHOLHDL, LDLDIRECT in the last 72 hours. Thyroid  Function Tests: No results  for input(s): TSH, T4TOTAL, FREET4, T3FREE, THYROIDAB in the last 72 hours. Anemia Panel: No results for input(s): VITAMINB12, FOLATE, FERRITIN, TIBC, IRON, RETICCTPCT in the last 72 hours. Sepsis Labs: Recent Labs  Lab 07/12/24 2309 07/16/24 0207  LATICACIDVEN 0.6 0.6    Recent Results (from the past 240 hours)  Blood Culture (routine x 2)     Status: None   Collection Time: 07/12/24 11:05 PM   Specimen: BLOOD  Result Value Ref Range Status   Specimen Description BLOOD LEFT ANTECUBITAL  Final   Special Requests   Final    BOTTLES DRAWN AEROBIC AND ANAEROBIC Blood Culture adequate volume   Culture   Final    NO GROWTH 5 DAYS Performed at [facility], [address], [place], [place] [address]    Report Status 07/17/2024 FINAL  Final  Urine Culture     Status: Abnormal   Collection Time: 07/12/24 11:14 PM   Specimen: Urine, Random  Result Value Ref Range Status   Specimen Description URINE, RANDOM  Final   Special Requests   Final    NONE Reflexed from  [id number] Performed at [facility], [address], [place], [place] [address]    Culture >=100,000 COLONIES/mL ENTEROBACTER CLOACAE (A)  Final   Report Status 07/15/2024 FINAL  Final   Organism ID, Bacteria ENTEROBACTER CLOACAE (A)  Final      Susceptibility   Enterobacter cloacae - MIC*    CEFEPIME  2 SENSITIVE Sensitive     CIPROFLOXACIN  <=0.06 SENSITIVE Sensitive     GENTAMICIN <=1 SENSITIVE Sensitive     NITROFURANTOIN 32 SENSITIVE Sensitive     TRIMETH /SULFA  <=20 SENSITIVE Sensitive     PIP/TAZO Value in next row Resistant      >=128 RESISTANTThis is a modified FDA-approved test that has been validated and its performance characteristics determined by the reporting laboratory.  This laboratory is certified under the Clinical Laboratory Improvement Amendments CLIA as qualified to perform high complexity clinical laboratory testing.    MEROPENEM Value in next row Sensitive      >=128  RESISTANTThis is a modified FDA-approved test that has been validated and its performance characteristics determined by the reporting laboratory.  This laboratory is certified under the Clinical Laboratory Improvement Amendments CLIA as qualified to perform high complexity clinical laboratory testing.    * >=100,000 COLONIES/mL ENTEROBACTER CLOACAE  Blood Culture (routine x 2)     Status: None   Collection Time: 07/12/24 11:41 PM   Specimen: BLOOD  Result Value Ref Range Status   Specimen Description BLOOD RIGHT ANTECUBITAL  Final   Special Requests   Final    BOTTLES DRAWN AEROBIC AND ANAEROBIC Blood Culture results may not be optimal due to an inadequate volume of blood received in culture bottles   Culture   Final    NO GROWTH 5 DAYS Performed at [facility], [address] [place], [place] [address]    Report Status 07/18/2024 FINAL  Final  Urine Culture     Status: None   Collection Time: 07/16/24  1:43 AM   Specimen: Urine, Random  Result Value Ref Range Status   Specimen Description   Final    URINE, RANDOM Performed at [facility], [address] [place], [place] [address]    Special Requests   Final    NONE Reflexed from [id number] Performed at [facility], [address] [place], [place] [address]    Culture   Final    NO GROWTH Performed at [facility], [address] [place], [place] [address]    Report Status 07/17/2024 FINAL  Final  Blood Culture (routine x 2)     Status: None (Preliminary result)   Collection Time: 07/16/24  2:00 AM   Specimen: BLOOD  Result Value Ref Range Status   Specimen Description   Final    BLOOD RIGHT ANTECUBITAL Performed at [facility], [address], [place], [place] [address]    Special Requests   Final    Blood Culture adequate volume BOTTLES DRAWN AEROBIC AND ANAEROBIC Performed at [facility], [address] [place], [place]  [address]    Culture   Final    NO GROWTH 2 DAYS Performed at [facility], [address] [place], [place] [address]    Report Status PENDING  Incomplete  Blood Culture (routine x 2)     Status: None (Preliminary result)   Collection Time: 07/16/24  2:00 AM   Specimen: BLOOD  Result Value Ref Range Status   Specimen Description  Final    BLOOD LEFT ANTECUBITAL Performed at [facility], [address] [place], [place] [address]    Special Requests   Final    Blood Culture adequate volume BOTTLES DRAWN AEROBIC AND ANAEROBIC Performed at [facility], [address], [place], [place] [address]    Culture   Final    NO GROWTH 2 DAYS Performed at [facility], [address] [place], [place] [address]    Report Status PENDING  Incomplete         Radiology Studies: CT HEAD WO CONTRAST ( ) Result Date: 07/18/2024 EXAM: CT HEAD WITHOUT CONTRAST 07/18/2024 08:30:48 AM TECHNIQUE: CT of the head was performed without the administration of intravenous contrast. Automated exposure control, iterative reconstruction, and/or weight based adjustment of the mA/kV was utilized to reduce the radiation dose to as low as reasonably achievable. COMPARISON: 06/19/2024 CLINICAL HISTORY: Delirium FINDINGS: BRAIN AND VENTRICLES: No acute hemorrhage. No evidence of acute infarct. No hydrocephalus. No extra-axial collection. No mass effect or midline shift. Chronic encephalomalacia changes in the right medial temporal and occipital lobes have developed in the interim. Unchanged small calcified meningioma along left frontal convexity. ORBITS: No acute abnormality. SINUSES: No acute abnormality. SOFT TISSUES AND SKULL: No acute soft tissue abnormality. No skull fracture. Small bilateral mastoid effusions. IMPRESSION: 1. Chronic encephalomalacia changes in the right medial temporal and occipital lobes developed in the interim. 2. Unchanged small calcified  meningioma along left frontal convexity. Electronically signed by: Timothy Berrigan MD 07/18/2024 09:00 AM EST RP Workstation: [id number]        Scheduled Meds:  atorvastatin   80 mg Oral QPM   Chlorhexidine  Gluconate Cloth  6 each Topical Daily   enoxaparin   1 mg/kg Subcutaneous BID   insulin  aspart  0-5 Units Subcutaneous QHS   insulin  aspart  0-6 Units Subcutaneous TID WC   insulin  glargine-yfgn  8 Units Subcutaneous Daily   lidocaine   1 patch Transdermal Daily   Continuous Infusions:  ceFEPime  (MAXIPIME ) IV 2 g (07/18/24 0905)          Sophie Mao, MD [facility] 07/18/2024, 11:34 AM

## 2024-07-18 NOTE — Progress Notes (Signed)
 Patient had no output for the shift. Bladder scan at 5:37 showed a residual of 343 ml. I&O completed with output of 950 ml. This is the third time I&O has had to be done. Notified Lavanda Horns, NP and received orders to place foley.

## 2024-07-19 ENCOUNTER — Inpatient Hospital Stay (HOSPITAL_COMMUNITY)

## 2024-07-19 DIAGNOSIS — R4182 Altered mental status, unspecified: Secondary | ICD-10-CM

## 2024-07-19 DIAGNOSIS — T361X4A Poisoning by cephalosporins and other beta-lactam antibiotics, undetermined, initial encounter: Secondary | ICD-10-CM

## 2024-07-19 DIAGNOSIS — G9341 Metabolic encephalopathy: Secondary | ICD-10-CM | POA: Diagnosis not present

## 2024-07-19 DIAGNOSIS — G9340 Encephalopathy, unspecified: Secondary | ICD-10-CM | POA: Diagnosis not present

## 2024-07-19 DIAGNOSIS — N390 Urinary tract infection, site not specified: Secondary | ICD-10-CM | POA: Diagnosis not present

## 2024-07-19 DIAGNOSIS — R569 Unspecified convulsions: Secondary | ICD-10-CM | POA: Diagnosis not present

## 2024-07-19 LAB — CBC WITH DIFFERENTIAL/PLATELET
Abs Immature Granulocytes: 0.02 K/uL (ref 0.00–0.07)
Basophils Absolute: 0 K/uL (ref 0.0–0.1)
Basophils Relative: 1 %
Eosinophils Absolute: 0 K/uL (ref 0.0–0.5)
Eosinophils Relative: 1 %
HCT: 33.4 % — ABNORMAL LOW (ref 36.0–46.0)
Hemoglobin: 10.8 g/dL — ABNORMAL LOW (ref 12.0–15.0)
Immature Granulocytes: 0 %
Lymphocytes Relative: 22 %
Lymphs Abs: 1.2 K/uL (ref 0.7–4.0)
MCH: 26.9 pg (ref 26.0–34.0)
MCHC: 32.3 g/dL (ref 30.0–36.0)
MCV: 83.1 fL (ref 80.0–100.0)
Monocytes Absolute: 0.4 K/uL (ref 0.1–1.0)
Monocytes Relative: 7 %
Neutro Abs: 3.8 K/uL (ref 1.7–7.7)
Neutrophils Relative %: 69 %
Platelets: 230 K/uL (ref 150–400)
RBC: 4.02 MIL/uL (ref 3.87–5.11)
RDW: 15.9 % — ABNORMAL HIGH (ref 11.5–15.5)
WBC: 5.5 K/uL (ref 4.0–10.5)
nRBC: 0 % (ref 0.0–0.2)

## 2024-07-19 LAB — COMPREHENSIVE METABOLIC PANEL WITH GFR
ALT: 29 U/L (ref 0–44)
AST: 25 U/L (ref 15–41)
Albumin: 3.2 g/dL — ABNORMAL LOW (ref 3.5–5.0)
Alkaline Phosphatase: 76 U/L (ref 38–126)
Anion gap: 16 — ABNORMAL HIGH (ref 5–15)
BUN: 11 mg/dL (ref 8–23)
CO2: 19 mmol/L — ABNORMAL LOW (ref 22–32)
Calcium: 9.1 mg/dL (ref 8.9–10.3)
Chloride: 101 mmol/L (ref 98–111)
Creatinine, Ser: 0.76 mg/dL (ref 0.44–1.00)
GFR, Estimated: >60 mL/min (ref >60)
Glucose, Bld: 236 mg/dL — ABNORMAL HIGH (ref 70–99)
Potassium: 4.1 mmol/L (ref 3.5–5.1)
Sodium: 136 mmol/L (ref 135–145)
Total Bilirubin: 1.2 mg/dL (ref 0.0–1.2)
Total Protein: 7 g/dL (ref 6.5–8.1)

## 2024-07-19 LAB — MAGNESIUM: Magnesium: 1.5 mg/dL — ABNORMAL LOW (ref 1.7–2.4)

## 2024-07-19 LAB — TSH: TSH: 0.23 u[IU]/mL — ABNORMAL LOW (ref 0.350–4.500)

## 2024-07-19 LAB — GLUCOSE, CAPILLARY
Glucose-Capillary: 206 mg/dL — ABNORMAL HIGH (ref 70–99)
Glucose-Capillary: 272 mg/dL — ABNORMAL HIGH (ref 70–99)
Glucose-Capillary: 310 mg/dL — ABNORMAL HIGH (ref 70–99)
Glucose-Capillary: 270 mg/dL — ABNORMAL HIGH (ref 70–99)
Glucose-Capillary: 231 mg/dL — ABNORMAL HIGH (ref 70–99)

## 2024-07-19 LAB — FOLATE: Folate: 17.7 ng/mL (ref >5.9)

## 2024-07-19 LAB — AMMONIA: Ammonia: <13 umol/L (ref 9–35)

## 2024-07-19 LAB — VITAMIN B12: Vitamin B-12: 512 pg/mL (ref 180–914)

## 2024-07-19 MED ORDER — ACETAMINOPHEN 10 MG/ML IV SOLN
1000.0000 mg | Freq: Four times a day (QID) | INTRAVENOUS | Status: AC
  Administered 2024-07-19 – 2024-07-20 (×2): 1000 mg via INTRAVENOUS
  Filled 2024-07-19 (×2): qty 100

## 2024-07-19 MED ORDER — LORAZEPAM 2 MG/ML IJ SOLN: 2.0000 mg | Freq: Once | INTRAMUSCULAR | Status: DC

## 2024-07-19 MED ORDER — SODIUM CHLORIDE 0.9 % IV SOLN
1.0000 g | Freq: Three times a day (TID) | INTRAVENOUS | Status: DC
  Administered 2024-07-19 – 2024-07-22 (×10): 1 g via INTRAVENOUS
  Filled 2024-07-19 (×12): qty 20

## 2024-07-19 MED ORDER — LEVETIRACETAM (KEPPRA) 500 MG/5 ML ADULT IV PUSH
1500.0000 mg | Freq: Once | INTRAVENOUS | Status: AC
  Administered 2024-07-19: 1500 mg via INTRAVENOUS
  Filled 2024-07-19: qty 15

## 2024-07-19 MED ORDER — FENTANYL CITRATE (PF) 50 MCG/ML IJ SOSY
25.0000 ug | PREFILLED_SYRINGE | INTRAMUSCULAR | Status: DC | PRN
Start: 2024-07-19 — End: 2024-08-01
  Administered 2024-07-19: 50 ug via INTRAVENOUS
  Administered 2024-07-19: 25 ug via INTRAVENOUS
  Administered 2024-07-21 – 2024-08-01 (×53): 50 ug via INTRAVENOUS
  Filled 2024-07-19 (×57): qty 1

## 2024-07-19 MED ORDER — LORAZEPAM 2 MG/ML IJ SOLN
1.0000 mg | Freq: Once | INTRAMUSCULAR | Status: AC
  Administered 2024-07-19: 1 mg via INTRAVENOUS
  Filled 2024-07-19: qty 1

## 2024-07-19 NOTE — Progress Notes (Signed)
 Patient family request midnight vitals to be skipped as patient has just went to sleep.

## 2024-07-19 NOTE — Progress Notes (Signed)
 STAT EEG complete - results pending. ? ?

## 2024-07-19 NOTE — Progress Notes (Signed)
 PROGRESS NOTE    Brenda [name]  FMW:[id number] DOB: 1961/07/06 DOA: 07/15/2024 PCP: [facility]   Brief Narrative:  63 year old female with history of metastatic endometrial cancer, chronic cancer-related pain, hypertension, hyperlipidemia, diabetes mellitus, depression, anxiety, CVA, history of DVT and PE anticoagulated on Lovenox , OSA, chronic hypoxic respiratory failure on 2L via nasal cannula and recent hospitalization and discharged on 07/14/2024 for UTI treated with IV antibiotics followed by oral antibiotics on discharge presented with chills and weakness.  She was found to have UTI and was started on IV cefepime .  Unfortunately mental status worsened and she was transferred to MICU for continuous EEG monitoring.  Assessment & Plan:  Acute metabolic encephalopathy ?  Subclinical seizures -Patient has had intermittent confusion since admission and has been slow to respond to secondary to UTI.  However on 11/15 CT head worsening status with the stiffening of upper extremities, patient transferred to [facility] for continuous video EEG.  MRI brain on 11/15 is negative for acute findings.  Shows evaluation of ischemic infarcts similar to before.   IV cefepime  could be culprit and this was discontinued. EEG continuing, no evidence of seizures per reporting this morning.  UTI: Present on admission, failed p.o. antibiotics - Blood and urine cultures negative so far.  Continue IV meropenem (start 11/15   Endometrial cancer with metastases Chronic cancer-related pain with opiate dependence - Outpatient follow-up with oncology/Dr. Lonn.  Continue current pain management. - Discussed with patient sister-in-law.  She requested second opinion at [facility].  Referral will be sent  Chronic respiratory failure with hypoxia OSA - Respiratory status stable.  Continue supplemental oxygen .  Continue CPAP at night  History of PE and DVT - Continue Lovenox   Diabetes mellitus  type 2 with intermittent hypoglycemia - Continue long-acting insulin .  Carb modified diet.  CBGs with SSI.  Hypomagnesemia - Resolved  Anemia of chronic disease - From cancer.  Hemoglobin stable.  No signs of bleeding.  Monitor intermittently  Depression/anxiety - Prozac , Remeron, Haldol discontinued as discussed above.    History of CAD History of stroke Hyperlipidemia -Continue statin.  Not on aspirin  since she is on treatment dose Lovenox .  Obesity class I - Outpatient follow-up  Physical deconditioning - Likely will need SNF pending her clinical course.  DVT prophylaxis: Lovenox  therapeutic dosing Code Status: DNR Family Communication: Husband at bedside, I spoke with sister-in-law over the phone disposition Plan: Status is:  inpatient because: Severity of illness    Consultants: None  Procedures: None  Antimicrobials: Cefepime  from 07/16/2024 -11/14, Meropenem 11/15-   Subjective:  Patient seen and examined at the bedside.  She is awake but altered.  Husband at the bedside states she was able to bring out some words earlier.  Has tremors both upper extremity.  She is trying to raise both of her arms to her head  Objective: Vitals:   07/18/24 2107 07/19/24 0330 07/19/24 0800 07/19/24 1251  BP: (!) 148/93 (!) 164/74 (!) 166/90 (!) 159/74  Pulse: 95 (!) 110 (!) 112 100  Resp: 20 (!) 22 18 16   Temp: 98.4 F (36.9 C) 99 F (37.2 C) 98.8 F (37.1 C) 98.9 F (37.2 C)  TempSrc: Oral Axillary Axillary Oral  SpO2: 100% 100% 100% 100%  Weight:      Height:        Intake/Output Summary (Last 24 hours) at 07/19/2024 1521 Last data filed at 07/19/2024 1251 Gross per 24 hour  Intake 340.52 ml  Output 1375 ml  Net -  1034.48 ml   Filed Weights   07/16/24 1546  Weight: 77.9 kg    Examination:  General: Awake, does not follow commands Chest: Clear CVs: S1, S2, no murmur, regular rhythm Abdomen: Soft, nontender Extremities: No edema Neuro: Awake, does not  follow commands, both upper extremity tremor, video EEG continuing   Data Reviewed: I have personally reviewed following labs and imaging studies  CBC: Recent Labs  Lab 07/12/24 2313 07/13/24 0733 07/14/24 0450 07/16/24 0200 07/17/24 0600 07/18/24 0554 07/19/24 0245  WBC 4.0   < > 4.1 3.5* 4.2 4.8 5.5  NEUTROABS 2.3  --   --  2.1  --  3.4 3.8  HGB 9.3*   < > 8.9* 9.5* 9.3* 10.4* 10.8*  HCT 30.1*   < > 28.5* 29.7* 29.9* 33.2* 33.4*  MCV 86.0   < > 85.3 84.9 86.2 86.2 83.1  PLT 258   < > 259 281 253 232 230   < > = values in this interval not displayed.   Basic Metabolic Panel: Recent Labs  Lab 07/14/24 0450 07/16/24 0200 07/17/24 0600 07/18/24 0554 07/19/24 0245  NA 138 137 140 139 136  K 4.2 4.3 4.3 4.4 4.1  CL 102 102 104 103 101  CO2 26 26 27 24  19*  GLUCOSE 187* 238* 100* 189* 236*  BUN 8 9 9 9 11   CREATININE 0.48 0.56 0.61 0.56 0.76  CALCIUM  9.2 9.6 9.4 9.4 9.1  MG  --   --  1.6* 1.8 1.5*   GFR: Estimated Creatinine Clearance: 71.3 mL/min (by C-G formula based on SCr of 0.76 mg/dL). Liver Function Tests: Recent Labs  Lab 07/12/24 2313 07/16/24 0200 07/19/24 0245  AST 24 22 25   ALT 24 23 29   ALKPHOS 70 83 76  BILITOT 0.2 0.2 1.2  PROT 6.8 6.8 7.0  ALBUMIN 3.0* 3.5 3.2*   No results for input(s): LIPASE, AMYLASE in the last 168 hours. Recent Labs  Lab 07/19/24 0245  AMMONIA <13   Coagulation Profile: Recent Labs  Lab 07/12/24 2313 07/16/24 0200  INR 1.0 1.0   Cardiac Enzymes: No results for input(s): CKTOTAL, CKMB, CKMBINDEX, TROPONINI in the last 168 hours. BNP (last 3 results) Recent Labs    05/22/24 0630  PROBNP 3,514.0*   HbA1C: No results for input(s): HGBA1C in the last 72 hours. CBG: Recent Labs  Lab 07/18/24 1959 07/18/24 2109 07/19/24 0251 07/19/24 0612 07/19/24 1247  GLUCAP 171* 179* 206* 272* 310*   Lipid Profile: No results for input(s): CHOL, HDL, LDLCALC, TRIG, CHOLHDL, LDLDIRECT in the  last 72 hours. Thyroid  Function Tests: Recent Labs    07/19/24 0245  TSH 0.230*   Anemia Panel: Recent Labs    07/19/24 0245  VITAMINB12 512  FOLATE 17.7   Sepsis Labs: Recent Labs  Lab 07/12/24 2309 07/16/24 0207  LATICACIDVEN 0.6 0.6    Recent Results (from the past 240 hours)  Blood Culture (routine x 2)     Status: None   Collection Time: 07/12/24 11:05 PM   Specimen: BLOOD  Result Value Ref Range Status   Specimen Description BLOOD LEFT ANTECUBITAL  Final   Special Requests   Final    BOTTLES DRAWN AEROBIC AND ANAEROBIC Blood Culture adequate volume   Culture   Final    NO GROWTH 5 DAYS Performed at [facility], [address], [place], [place] [address]    Report Status 07/17/2024 FINAL  Final  Urine Culture     Status: Abnormal  Collection Time: 07/12/24 11:14 PM   Specimen: Urine, Random  Result Value Ref Range Status   Specimen Description URINE, RANDOM  Final   Special Requests   Final    NONE Reflexed from [id number] Performed at [facility], [address] [place], [place] [address]    Culture >=100,000 COLONIES/mL ENTEROBACTER CLOACAE (A)  Final   Report Status 07/15/2024 FINAL  Final   Organism ID, Bacteria ENTEROBACTER CLOACAE (A)  Final      Susceptibility   Enterobacter cloacae - MIC*    CEFEPIME  2 SENSITIVE Sensitive     CIPROFLOXACIN  <=0.06 SENSITIVE Sensitive     GENTAMICIN <=1 SENSITIVE Sensitive     NITROFURANTOIN 32 SENSITIVE Sensitive     TRIMETH /SULFA  <=20 SENSITIVE Sensitive     PIP/TAZO Value in next row Resistant      >=128 RESISTANTThis is a modified FDA-approved test that has been validated and its performance characteristics determined by the reporting laboratory.  This laboratory is certified under the Clinical Laboratory Improvement Amendments CLIA as qualified to perform high complexity clinical laboratory testing.    MEROPENEM Value in next row Sensitive      >=128 RESISTANTThis is a modified FDA-approved test  that has been validated and its performance characteristics determined by the reporting laboratory.  This laboratory is certified under the Clinical Laboratory Improvement Amendments CLIA as qualified to perform high complexity clinical laboratory testing.    * >=100,000 COLONIES/mL ENTEROBACTER CLOACAE  Blood Culture (routine x 2)     Status: None   Collection Time: 07/12/24 11:41 PM   Specimen: BLOOD  Result Value Ref Range Status   Specimen Description BLOOD RIGHT ANTECUBITAL  Final   Special Requests   Final    BOTTLES DRAWN AEROBIC AND ANAEROBIC Blood Culture results may not be optimal due to an inadequate volume of blood received in culture bottles   Culture   Final    NO GROWTH 5 DAYS Performed at [facility], [address], [place], [place] [address]    Report Status 07/18/2024 FINAL  Final  Urine Culture     Status: None   Collection Time: 07/16/24  1:43 AM   Specimen: Urine, Random  Result Value Ref Range Status   Specimen Description   Final    URINE, RANDOM Performed at [facility], [address] [place], [place] [address]    Special Requests   Final    NONE Reflexed from [id number] Performed at [facility], [address] [place], [place] [address]    Culture   Final    NO GROWTH Performed at [facility], [address], [place], [place] [address]    Report Status 07/17/2024 FINAL  Final  Blood Culture (routine x 2)     Status: None (Preliminary result)   Collection Time: 07/16/24  2:00 AM   Specimen: BLOOD  Result Value Ref Range Status   Specimen Description   Final    BLOOD RIGHT ANTECUBITAL Performed at [facility], [address] [place], [place] [address]    Special Requests   Final    Blood Culture adequate volume BOTTLES DRAWN AEROBIC AND ANAEROBIC Performed at [facility], [address], [place], [place] [address]    Culture   Final    NO GROWTH 3  DAYS Performed at [facility], [address] [place], [place] [address]    Report Status PENDING  Incomplete  Blood Culture (routine x 2)     Status: None (Preliminary result)   Collection Time: 07/16/24  2:00 AM   Specimen: BLOOD  Result Value Ref Range Status   Specimen Description   Final    BLOOD LEFT ANTECUBITAL Performed at [facility], [address] [place], [place] [address]    Special Requests   Final    Blood Culture adequate volume BOTTLES DRAWN AEROBIC AND ANAEROBIC Performed at [facility], [address] [place], [place] [address]    Culture   Final    NO GROWTH 3 DAYS Performed at [facility], [address] [place], [place] [address]    Report Status PENDING  Incomplete         Radiology Studies: EEG adult Result Date: 07/19/2024 Shelton Arlin KIDD, MD     07/19/2024  7:37 AM Patient Name: Brenda [name] MRN: [id number] Epilepsy Attending: Arlin KIDD Shelton Referring Physician/Provider: Cheryle Page, MD Date: 07/19/2024 Duration: 27.07 mins Patient history: 63yo F with ams. EEG to evaluate for seizure Level of alertness: Awake AEDs during EEG study: LEV Technical aspects: This EEG study was done with scalp electrodes positioned according to the 10-20 International system of electrode placement. Electrical activity was reviewed with band pass filter of 1-70Hz , sensitivity of 7 uV/mm, display speed of 43mm/sec with a 60Hz  notched filter applied as appropriate. EEG data were recorded continuously and digitally stored.  Video monitoring was available and reviewed as appropriate. Description: EEG showed continuous generalized high amplitude sharply contoured 3 to 6 Hz theta-delta slowing.  Hyperventilation and photic stimulation were not performed.   ABNORMALITY - Continuous slow, generalized IMPRESSION: This study is suggestive of generalized cerebral dysfunction (encephalopathy). No seizures or epileptiform  discharges were seen throughout the recording. Arlin KIDD Shelton   MR BRAIN WO CONTRAST Result Date: 07/18/2024 EXAM: MRI BRAIN WITHOUT CONTRAST 07/18/2024 06:36:54 PM TECHNIQUE: Multiplanar multisequence MRI of the head/brain was performed without the administration of intravenous contrast. COMPARISON: MRI head from earlier today. CLINICAL HISTORY: Mental status change, unknown cause FINDINGS: BRAIN AND VENTRICLES: Similar evolving infarcts in the anterior and posterior circulation as detailed on the recent priors. No new infarcts. No intracranial hemorrhage. No mass. No midline shift. No hydrocephalus. Normal flow voids. ORBITS: No acute abnormality. SINUSES AND MASTOIDS: No acute abnormality. BONES AND SOFT TISSUES: Normal marrow signal. IMPRESSION: 1. Similar evolving infarcts in the anterior and posterior circulation. 2. No new infarcts, progressive mass effect or acute hemorrhage. Electronically signed by: Gilmore Molt MD 07/18/2024 07:07 PM EST RP Workstation: [id number]   MR BRAIN WO CONTRAST Result Date: 07/18/2024 EXAM: MRI BRAIN WITHOUT CONTRAST 07/18/2024 12:42:20 PM TECHNIQUE: Multiplanar multisequence MRI of the head/brain was performed without the administration of intravenous contrast. COMPARISON: CT of the head dated 07/18/2024 and MRI of the head dated 06/20/2024. CLINICAL HISTORY: Mental status change, unknown cause. FINDINGS: BRAIN AND VENTRICLES: No acute infarct. No intracranial hemorrhage. No mass. No midline shift. No hydrocephalus. There continues to be increased diffusion and T2 signal along the lateral ependymal surface of the posterior horn of the right lateral ventricle. There is increased diffusion signal also again demonstrated within the splenium of the corpus callosum on the right and within the adjacent periventricular white matter. There are also several foci of high diffusion signal again demonstrated within the centrum semiovale bilaterally. There is no adverse interval  change. The sella is unremarkable. Normal flow voids. ORBITS: No acute abnormality. SINUSES AND MASTOIDS: No acute abnormality.  BONES AND SOFT TISSUES: Normal marrow signal. No acute soft tissue abnormality. IMPRESSION: 1. No acute findings. 2. Stable increased diffusion and T2 signal along the lateral ependymal surface of the posterior horn of the right lateral ventricle, within the splenium of the corpus callosum on the right, within the adjacent periventricular white matter, and within the centrum semiovale bilaterally. Electronically signed by: Evalene Coho MD 07/18/2024 12:58 PM EST RP Workstation: [id number]   CT HEAD WO CONTRAST ( ) Result Date: 07/18/2024 EXAM: CT HEAD WITHOUT CONTRAST 07/18/2024 08:30:48 AM TECHNIQUE: CT of the head was performed without the administration of intravenous contrast. Automated exposure control, iterative reconstruction, and/or weight based adjustment of the mA/kV was utilized to reduce the radiation dose to as low as reasonably achievable. COMPARISON: 06/19/2024 CLINICAL HISTORY: Delirium FINDINGS: BRAIN AND VENTRICLES: No acute hemorrhage. No evidence of acute infarct. No hydrocephalus. No extra-axial collection. No mass effect or midline shift. Chronic encephalomalacia changes in the right medial temporal and occipital lobes have developed in the interim. Unchanged small calcified meningioma along left frontal convexity. ORBITS: No acute abnormality. SINUSES: No acute abnormality. SOFT TISSUES AND SKULL: No acute soft tissue abnormality. No skull fracture. Small bilateral mastoid effusions. IMPRESSION: 1. Chronic encephalomalacia changes in the right medial temporal and occipital lobes developed in the interim. 2. Unchanged small calcified meningioma along left frontal convexity. Electronically signed by: Timothy Berrigan MD 07/18/2024 09:00 AM EST RP Workstation: [id number]        Scheduled Meds:  atorvastatin   80 mg Oral QPM   Chlorhexidine  Gluconate  Cloth  6 each Topical Daily   enoxaparin   1 mg/kg Subcutaneous BID   insulin  aspart  0-5 Units Subcutaneous QHS   insulin  aspart  0-6 Units Subcutaneous TID WC   insulin  glargine-yfgn  8 Units Subcutaneous Daily   lidocaine   1 patch Transdermal Daily   Continuous Infusions:  meropenem (MERREM) IV 1 g (07/19/24 1423)          Derryl Duval, MD [facility] 07/19/2024, 3:21 PM

## 2024-07-19 NOTE — Significant Event (Signed)
 Rapid Response Event Note   Reason for Call :  L sided facial droop  Initial Focused Assessment:  Pt lying in bed with eyes open. Her upper extremities are bent at the elbows and are tremoring. She will not make eye contact or follow commands and will only answer yes  and say stop during exam. Pupils 6(oval) on the L and 4(round) on the R. She has a L facial droop. This gets better the more she is awake. NIH-15, LSN-days ago.   T-99, HR-120, BP-164/74,  RR-20, SpO2-100% on [place] 2L   Interventions:  CBG-206 Keppra 1500mg  IV Cefepime  changed to Meropenem EEG Plan of Care:  Give keppra. EEG tech coming to place pt on EEG monitoring. Continue to monitor pt closely. Please call RRT if further assistance needed.   Event Summary:   MD Notified: Dr. Charlton notified by bedside RN, Dr. Vanessa notified, both came to bedside to assess pt.  Call Upfz:[phone] Arrival Time:0320 End Time:0400  Tish Graeme Piety, RN

## 2024-07-19 NOTE — Care Management (Signed)
 Spoke w patient's spouse over the phone.  He states that his sister in law Armando Mares [phone]  has questions about getting a second opinion for cancer treatment. He explains that she and patients mother live in [place] and bother are undergoing cancer treatment, this has given her insight to some potential treatment options she feels she would like to discuss with an oncology provider.  Explained ICM role and offered to notify attending to contact Marlis to discuss treatment plan options and how to obtain second opinion. He was agreeable for this, and attending was notified.  Mr Correia clarified that he will remain primary contact for discharge planning.    Streater,Matthew (Spouse) [phone]

## 2024-07-19 NOTE — Care Management (Addendum)
 Per nursing staff patient is not alert and oriented.  Reached out to spouse for DC planning discussion, LVM, awaiting call back.  Per nursing staff the sister Armando Mares [phone]  is requesting to be called for dispo planning. Will call once I have permission from patient's spouse   Per chart review, patient was recently DC'd from [facility] with HH through [facility], current recs are for SNF

## 2024-07-19 NOTE — Consult Note (Signed)
 NEUROLOGY CONSULT NOTE   Date of service: July 19, 2024 Patient Name: Florice Hindle MRN:  [id number] DOB:  05/05/1961 Chief Complaint: tremors, worsening confusion Requesting Provider: Cheryle Page, MD  History of Present Illness  Kensi Karr is a 63 y.o. female with hx of CAD, DM2, obseity, OSA, metastatic endometrial cancer, prior stroke and DVT/PE on [facility] with SQ lovenox  who has had recurrent UTIs and is admitted at [facility] with a UTI.  Per daughter at the bedside, she was discharged from the hospital on Tuesday 11/11 and then developed chills and weakness and readmitted on Wednesday for a recurrent UTI. She was noted to have worsening encephalopathy that started around Wednesday/Thursday along with tremors in BL uppers. Initially the tremors were intermittent and would go away when she would sleep but now they are present all the time when she is awake. She is currently being treated with Cefepime  and has been on it for the last few days. She was also on gabapentin  and opiods which were held. Her Prozac , Remeron and Haldol were also discontinued.  She had a CT Head and MRI Brain which shows evolving strokes in anterior and posterior circulation that were noted on prior MRI Brain.  Case was discussed with neurology and given worsening encephalopathy along with tremors, she was transferred to [facility] for cEEG and inperson neurology evaluation.   ROS  Unable to ascertain due to encephalopathy.  Past History   Past Medical History:  Diagnosis Date   Anemia    Arthritis    back, hands, hips (02/22/2015)   CAD (coronary artery disease)    a. complex LAD/diagonal bifurcation PCI in 2010. b. STEMI 10/2019 s/p PTCA/DES x1 to mLAD overlapping the old stent, residual disease treated medicaly.   Cancer [facility])    uterine   CHF (congestive heart failure) (HCC)    Depression    Diabetes mellitus (HCC)    started when I was pregnant; not sure if it was type 1 or type  2    History of radiation therapy    Endometrium- HDR 01/22/22-03/07/22- Dr. Lynwood Nasuti   Hypercholesterolemia    Hypertension    Morbid obesity (HCC)    Myocardial infarction (HCC)    mild x 3   Neuromuscular disorder (HCC)    neuropathy feet   Sleep apnea    cpap/    Past Surgical History:  Procedure Laterality Date   CARDIAC CATHETERIZATION  12/02/2012   CHOLECYSTECTOMY OPEN  09/04/1987   CORONARY ANGIOPLASTY WITH STENT PLACEMENT  09/03/2009   CORONARY/GRAFT ACUTE MI REVASCULARIZATION N/A 10/26/2019   Procedure: CORONARY/GRAFT ACUTE MI REVASCULARIZATION;  Surgeon: Verlin Lonni BIRCH, MD;  Location: [facility] INVASIVE CV LAB;  Service: Cardiovascular;  Laterality: N/A;   INCISIONAL HERNIA REPAIR N/A 12/09/2021   Procedure: LAPAROSCOPIC REPAIR UMBILICAL HERNIA WITH MESH;  Surgeon: Vernetta Berg, MD;  Location: [facility] ORS;  Service: General;  Laterality: N/A;   IR IMAGING GUIDED PORT INSERTION  04/08/2024   LEFT HEART CATH AND CORONARY ANGIOGRAPHY N/A 10/26/2019   Procedure: LEFT HEART CATH AND CORONARY ANGIOGRAPHY;  Surgeon: Verlin Lonni BIRCH, MD;  Location: [facility] INVASIVE CV LAB;  Service: Cardiovascular;  Laterality: N/A;   LEFT HEART CATHETERIZATION WITH CORONARY ANGIOGRAM N/A 12/25/2012   Procedure: LEFT HEART CATHETERIZATION WITH CORONARY ANGIOGRAM;  Surgeon: Ozell Fell, MD;  Location: [facility] CATH LAB;  Service: Cardiovascular;  Laterality: N/A;   RADIOLOGY WITH ANESTHESIA N/A 04/21/2024   Procedure: MRI WITH ANESTHESIA;  Surgeon: Radiologist, Medication, MD;  Location: [facility] OR;  Service: Radiology;  Laterality: N/A;  BRAIN W/ W/O   ROBOTIC ASSISTED TOTAL HYSTERECTOMY WITH BILATERAL SALPINGO OOPHERECTOMY N/A 12/06/2021   Procedure: XI ROBOTIC ASSISTED TOTAL HYSTERECTOMY WITH BILATERAL SALPINGO OOPHORECTOMY;  Surgeon: [name] SAUNDERS, MD;  Location: [facility] ORS;  Service: Gynecology;  Laterality: N/A;   UMBILICAL HERNIA REPAIR  05/04/1989   doesn't think they used mesh    Family  History: Family History  Problem Relation Age of Onset   Coronary artery disease Mother        in her mid 79s   Hypertension Mother    Cancer Mother        skin   Heart attack Father        in his mid 62s   COPD Father    Stroke Sister    Coronary artery disease Sister    Diabetes Sister    Other Half-Sister    Thyroid  cancer Daughter    Prostate cancer Neg Hx    Colon cancer Neg Hx    Breast cancer Neg Hx    Endometrial cancer Neg Hx    Ovarian cancer Neg Hx    Pancreatic cancer Neg Hx     Social History  reports that she quit smoking about 37 years ago. Her smoking use included cigarettes and cigars. She started smoking about 38 years ago. She has a 0.5 pack-year smoking history. She has never used smokeless tobacco. She reports that she does not drink alcohol and does not use drugs.  Allergies  Allergen Reactions   Hydrocodone Shortness Of Breath, Dermatitis and Other (See Comments)    I forget to breathe.   Clopidogrel Hives and Itching    Medications   Current Facility-Administered Medications:    0.9 %  sodium chloride  infusion, , Intravenous, Continuous, Opyd, Timothy S, MD, Last Rate: 50 mL/hr at 07/18/24 2222, New Bag at 07/18/24 2222   acetaminophen  (TYLENOL ) tablet 650 mg, 650 mg, Oral, Q6H PRN **OR** acetaminophen  (TYLENOL ) suppository 650 mg, 650 mg, Rectal, Q6H PRN, Foust, Katy L, NP   atorvastatin  (LIPITOR ) tablet 80 mg, 80 mg, Oral, QPM, Foust, Katy L, NP, 80 mg at 07/17/24 1701   Chlorhexidine  Gluconate Cloth 2 % PADS 6 each, 6 each, Topical, Daily, Chavez, Abigail, NP, 6 each at 07/18/24 1012   enoxaparin  (LOVENOX ) injection 80 mg, 1 mg/kg, Subcutaneous, BID, Foust, Katy L, NP, 80 mg at 07/18/24 2222   fentaNYL  (SUBLIMAZE ) injection 25-50 mcg, 25-50 mcg, Intravenous, Q2H PRN, Opyd, Timothy S, MD, 50 mcg at 07/19/24 0240   haloperidol lactate (HALDOL) injection 2 mg, 2 mg, Intravenous, Q6H PRN, Cheryle, Kshitiz, MD, 2 mg at 07/19/24 0142   insulin  aspart  (novoLOG ) injection 0-5 Units, 0-5 Units, Subcutaneous, QHS, Foust, Katy L, NP   insulin  aspart (novoLOG ) injection 0-6 Units, 0-6 Units, Subcutaneous, TID WC, Alekh, Kshitiz, MD, 1 Units at 07/18/24 1256   insulin  glargine-yfgn (SEMGLEE ) injection 8 Units, 8 Units, Subcutaneous, Daily, Alekh, Kshitiz, MD, 8 Units at 07/18/24 0906   lidocaine  (LIDODERM ) 5 % 1 patch, 1 patch, Transdermal, Daily, Alekh, Kshitiz, MD, 1 patch at 07/18/24 1057   meropenem (MERREM) 1 g in sodium chloride  0.9 % 100 mL IVPB, 1 g, Intravenous, Q8H, Alekh, Kshitiz, MD   ondansetron  (ZOFRAN ) tablet 4 mg, 4 mg, Oral, Q6H PRN **OR** ondansetron  (ZOFRAN ) injection 4 mg, 4 mg, Intravenous, Q6H PRN, Foust, Katy L, NP   oxyCODONE  (Oxy IR/ROXICODONE ) immediate release tablet 5 mg, 5 mg, Oral, Q6H PRN, Alekh,  Kshitiz, MD   prochlorperazine  (COMPAZINE ) tablet 10 mg, 10 mg, Oral, Q6H PRN, Foust, Katy L, NP   senna-docusate (Senokot-S) tablet 1 tablet, 1 tablet, Oral, QHS PRN, Foust, Katy L, NP  Vitals   Vitals:   15-Aug-2024 1938 August 15, 2024 1958 08/15/2024 2107 07/19/24 0330  BP:  (!) 161/66 (!) 148/93 (!) 164/74  Pulse:  81 95 (!) 110  Resp: 18 18 20  (!) 22  Temp:  98.5 F (36.9 C) 98.4 F (36.9 C) 99 F (37.2 C)  TempSrc:  Oral Oral Axillary  SpO2:  100% 100% 100%  Weight:      Height:        Body mass index is 31.41 kg/m.   Physical Exam   General: Laying comfortably in bed;appears frail and ill. In no acute distress.  HENT: Normal oropharynx and mucosa. Normal external appearance of ears and nose.  Neck: Supple, no pain or tenderness  CV: No JVD. No peripheral edema.  Pulmonary: Symmetric Chest rise. Normal respiratory effort.  Abdomen: Soft to touch, non-tender.  Ext: No cyanosis, edema, or deformity  Skin: No rash. Normal palpation of skin.   Musculoskeletal: Normal digits and nails by inspection. No clubbing.   Neurologic Examination  Mental status/Cognition: encephalopathic and at most, moans and groans. Doe  snot answer any orientation questions. Speech/language: moans and groan, say stop to proximal pinch. Does not follow commands. Cranial nerves:   CN II L pupil dilated and unreactive to light, R pupil is 3mm and sluggish reaction to light. Appears to blink to threat BL but inconsistently.   CN III,IV,VI EOM intact to dolls eyes, no gaze preference or deviation, no nystagmus   CN V Corneals intact BL   CN VII ?mild R facial droop, improved with facial grimace.   CN VIII Does not make eye contact to speech   CN IX & X Gag intact, appears to be protecting her airway at this time.   CN XI Head is midline   CN XII Does not protrude tongue on command.   Sensory/Motor:  Muscle bulk: poor, tone cogwheeling noted, fine tremors in BL uppers and holding her arms up in the air. Withdraws all extremities to proximal pinch. Holding LUE up above her head spontaneousy.  Coordination/Complex Motor:  Unable to assess.  Labs/Imaging/Neurodiagnostic studies   CBC:  Recent Labs  Lab Aug 15, 2024 0554 07/19/24 0245  WBC 4.8 5.5  NEUTROABS 3.4 3.8  HGB 10.4* 10.8*  HCT 33.2* 33.4*  MCV 86.2 83.1  PLT 232 230   Basic Metabolic Panel:  Lab Results  Component Value Date   NA 136 07/19/2024   K 4.1 07/19/2024   CO2 19 (L) 07/19/2024   GLUCOSE 236 (H) 07/19/2024   BUN 11 07/19/2024   CREATININE 0.76 07/19/2024   CALCIUM  9.1 07/19/2024   GFRNONAA >60 07/19/2024   GFRAA 94 12/16/2019   Lipid Panel:  Lab Results  Component Value Date   LDLCALC 28 05/30/2024   HgbA1c:  Lab Results  Component Value Date   HGBA1C 7.2 (H) 06/01/2024   Urine Drug Screen:     Component Value Date/Time   LABOPIA POSITIVE (A) 04/20/2024 1817   COCAINSCRNUR NONE DETECTED 04/20/2024 1817   LABBENZ NONE DETECTED 04/20/2024 1817   AMPHETMU NONE DETECTED 04/20/2024 1817   THCU NONE DETECTED 04/20/2024 1817   LABBARB NONE DETECTED 04/20/2024 1817    Alcohol Level     Component Value Date/Time   ETH <15 04/20/2024  1344   INR  Lab  Results  Component Value Date   INR 1.0 07/16/2024   APTT  Lab Results  Component Value Date   APTT 67 (H) 06/21/2024   AED levels: No results found for: PHENYTOIN, ZONISAMIDE, LAMOTRIGINE, LEVETIRACETA  CT Head without contrast(Personally reviewed): 1. Chronic encephalomalacia changes in the right medial temporal and occipital lobes developed in the interim. 2. Unchanged small calcified meningioma along left frontal convexity.  MRI Brain(Personally reviewed): 1. Similar evolving infarcts in the anterior and posterior circulation. 2. No new infarcts, progressive mass effect or acute hemorrhage.  Neurodiagnostics cEEG:  pending  ASSESSMENT   Pluma Diniz is a 63 y.o. female with hx of CAD, DM2, obseity, OSA, metastatic endometrial cancer, prior stroke and DVT/PE on AC with SQ lovenox  who has had recurrent UTIs and is admitted at [facility] with a UTI and treated with Cefepime .  She has declined significantly over the last few days. Noted to have tremors that were initially intermittent and now more persistent, encephalopathy and not sleeping with some cogwheel rigidity noted on exam.  Presentation is most concerning at this time for Cefepime  induced neurotoxicity. Other medications contributing to tremors and encephalopathy include opiods, pregabalin ,.  Cefepime  can cause subclinical seizures. Will load with Keppra and get her up on LTM EEG.  RECOMMENDATIONS  - Keppra 1500mg  IV once - cEEG - Recommend alternative to Cefepime  - Judicious use of sedating medications including pregabalin , opiods, Haldol, Ativan. - If persistent tremors and cogwheeling despite being off Cefepime  for a couple days, would recommend trial of low dose Sinemet. ______________________________________________________________________  Plan discussed with daughter at the bedside and with husband over phone. Plan also discussed with Dr. Charlton with the ED Hospitalist  team.  Signed, Maysa Lynn, MD [facility]

## 2024-07-19 NOTE — Procedures (Signed)
 Patient Name: Brenda [name]  MRN: [id number]  Epilepsy Attending: Arlin MALVA Krebs  Referring Physician/Provider: Cheryle Page, MD  Date: 07/19/2024 Duration: 27.07 mins  Patient history: 63yo F with ams. EEG to evaluate for seizure  Level of alertness: Awake  AEDs during EEG study: LEV  Technical aspects: This EEG study was done with scalp electrodes positioned according to the 10-20 International system of electrode placement. Electrical activity was reviewed with band pass filter of 1-70Hz , sensitivity of 7 uV/mm, display speed of 83mm/sec with a 60Hz  notched filter applied as appropriate. EEG data were recorded continuously and digitally stored.  Video monitoring was available and reviewed as appropriate.  Description: EEG showed continuous generalized high amplitude sharply contoured 3 to 6 Hz theta-delta slowing.  Hyperventilation and photic stimulation were not performed.     ABNORMALITY - Continuous slow, generalized  IMPRESSION: This study is suggestive of generalized cerebral dysfunction (encephalopathy). No seizures or epileptiform discharges were seen throughout the recording.  Lessly Stigler O Haya Hemler

## 2024-07-19 NOTE — Progress Notes (Signed)
 Noted left sided facial droop. No other new deficits noted. Rapid response called and MD notified. Patient continues to be disoriented.

## 2024-07-19 NOTE — Progress Notes (Signed)
 Pharmacy Antibiotic Note  Brenda [name] is a 63 y.o. female on day #3 Cefepime  for Enterobacter UTI.  Pharmacy has been consulted for Meropenem dosing. Changing from Cefepime  due to worsening tremors and encephalopathy, possible seizures.  On antibiotics since 11/10, prior admit. Discharged 11/11 then readmitted 11/12.  Plan: Meropenem 1gm IV q8h. Follow up renal function, length of therapy.  Height: 5' 2 (157.5 cm) Weight: 77.9 kg (171 lb 11.8 oz) IBW/kg (Calculated) : 50.1  Temp (24hrs), Avg:98.3 F (36.8 C), Min:97.7 F (36.5 C), Max:99 F (37.2 C)  Recent Labs  Lab 07/12/24 2309 07/12/24 2313 07/14/24 0450 07/16/24 0200 07/16/24 0207 07/17/24 0600 07/18/24 0554 07/19/24 0245  WBC  --    < > 4.1 3.5*  --  4.2 4.8 5.5  CREATININE  --    < > 0.48 0.56  --  0.61 0.56 0.76  LATICACIDVEN 0.6  --   --   --  0.6  --   --   --    < > = values in this interval not displayed.    Estimated Creatinine Clearance: 71.3 mL/min (by C-G formula based on SCr of 0.76 mg/dL).    Allergies  Allergen Reactions   Hydrocodone Shortness Of Breath, Dermatitis and Other (See Comments)    I forget to breathe.   Clopidogrel Hives and Itching    Antimicrobials this admission: Prior admit 11/9>>11/11, Ceftriaxone  11/10>>11/11 > discharged on Cefadroxil  Ceftriaxone  x 1 on 11/13 Cefepime  11/13>>11/16 Meropenem 11/16>>  Dose adjustments this admission: n/a  Microbiology results: 11/9 urine: >100K/ml Enterobacter - sensitive to cefepime , cipro , meropenem, septra , gentamicin (MIC < 1), nitrofurantoin; resistant to Zosyn 11/13 blood: ng x 2 days to date 11/13 urine: negative  Thank you for allowing pharmacy to be a part of this patient's care.  Genaro Zebedee Calin, RPh 07/19/2024 5:14 AM

## 2024-07-19 NOTE — Progress Notes (Signed)
 LTM EEG hooked up and running - no initial skin breakdown - push button tested - [facility] monitoring.

## 2024-07-19 NOTE — Plan of Care (Signed)

## 2024-07-20 ENCOUNTER — Ambulatory Visit: Admitting: Adult Health

## 2024-07-20 ENCOUNTER — Inpatient Hospital Stay (HOSPITAL_COMMUNITY)

## 2024-07-20 ENCOUNTER — Encounter: Payer: Self-pay | Admitting: Hematology and Oncology

## 2024-07-20 ENCOUNTER — Other Ambulatory Visit: Payer: Self-pay | Admitting: Hematology and Oncology

## 2024-07-20 DIAGNOSIS — N390 Urinary tract infection, site not specified: Secondary | ICD-10-CM | POA: Diagnosis not present

## 2024-07-20 DIAGNOSIS — G9341 Metabolic encephalopathy: Secondary | ICD-10-CM | POA: Diagnosis not present

## 2024-07-20 DIAGNOSIS — R569 Unspecified convulsions: Secondary | ICD-10-CM | POA: Diagnosis not present

## 2024-07-20 DIAGNOSIS — T361X4A Poisoning by cephalosporins and other beta-lactam antibiotics, undetermined, initial encounter: Secondary | ICD-10-CM | POA: Diagnosis not present

## 2024-07-20 DIAGNOSIS — R4182 Altered mental status, unspecified: Secondary | ICD-10-CM | POA: Diagnosis not present

## 2024-07-20 LAB — GLUCOSE, CAPILLARY
Glucose-Capillary: 274 mg/dL — ABNORMAL HIGH (ref 70–99)
Glucose-Capillary: 270 mg/dL — ABNORMAL HIGH (ref 70–99)
Glucose-Capillary: 265 mg/dL — ABNORMAL HIGH (ref 70–99)
Glucose-Capillary: 258 mg/dL — ABNORMAL HIGH (ref 70–99)

## 2024-07-20 LAB — BASIC METABOLIC PANEL WITH GFR
Anion gap: 16 — ABNORMAL HIGH (ref 5–15)
BUN: 8 mg/dL (ref 8–23)
CO2: 16 mmol/L — ABNORMAL LOW (ref 22–32)
Calcium: 9.6 mg/dL (ref 8.9–10.3)
Chloride: 108 mmol/L (ref 98–111)
Creatinine, Ser: 0.9 mg/dL (ref 0.44–1.00)
GFR, Estimated: >60 mL/min (ref >60)
Glucose, Bld: 288 mg/dL — ABNORMAL HIGH (ref 70–99)
Potassium: 4.3 mmol/L (ref 3.5–5.1)
Sodium: 140 mmol/L (ref 135–145)

## 2024-07-20 LAB — CBC WITH DIFFERENTIAL/PLATELET
Abs Immature Granulocytes: 0.04 K/uL (ref 0.00–0.07)
Basophils Absolute: 0 K/uL (ref 0.0–0.1)
Basophils Relative: 0 %
Eosinophils Absolute: 0 K/uL (ref 0.0–0.5)
Eosinophils Relative: 0 %
HCT: 37.5 % (ref 36.0–46.0)
Hemoglobin: 12.2 g/dL (ref 12.0–15.0)
Immature Granulocytes: 0 %
Lymphocytes Relative: 12 %
Lymphs Abs: 1.4 K/uL (ref 0.7–4.0)
MCH: 27 pg (ref 26.0–34.0)
MCHC: 32.5 g/dL (ref 30.0–36.0)
MCV: 83 fL (ref 80.0–100.0)
Monocytes Absolute: 0.6 K/uL (ref 0.1–1.0)
Monocytes Relative: 5 %
Neutro Abs: 9.6 K/uL — ABNORMAL HIGH (ref 1.7–7.7)
Neutrophils Relative %: 83 %
Platelets: 280 K/uL (ref 150–400)
RBC: 4.52 MIL/uL (ref 3.87–5.11)
RDW: 16.5 % — ABNORMAL HIGH (ref 11.5–15.5)
WBC: 11.8 K/uL — ABNORMAL HIGH (ref 4.0–10.5)
nRBC: 0 % (ref 0.0–0.2)

## 2024-07-20 LAB — T4, FREE: Free T4: 1.41 ng/dL — ABNORMAL HIGH (ref 0.61–1.12)

## 2024-07-20 LAB — MAGNESIUM: Magnesium: 1.4 mg/dL — ABNORMAL LOW (ref 1.7–2.4)

## 2024-07-20 MED ORDER — LEVETIRACETAM (KEPPRA) 500 MG/5 ML ADULT IV PUSH
500.0000 mg | Freq: Two times a day (BID) | INTRAVENOUS | Status: DC
  Administered 2024-07-21: 500 mg via INTRAVENOUS
  Filled 2024-07-20: qty 5

## 2024-07-20 MED ORDER — INSULIN ASPART 100 UNIT/ML IJ SOLN
0.0000 [IU] | INTRAMUSCULAR | Status: DC
  Administered 2024-07-20 – 2024-07-21 (×4): 5 [IU] via SUBCUTANEOUS
  Administered 2024-07-21: 7 [IU] via SUBCUTANEOUS
  Administered 2024-07-21: 3 [IU] via SUBCUTANEOUS
  Administered 2024-07-21: 5 [IU] via SUBCUTANEOUS
  Filled 2024-07-20: qty 3
  Filled 2024-07-20: qty 7
  Filled 2024-07-20: qty 5
  Filled 2024-07-20: qty 3
  Filled 2024-07-20 (×3): qty 5

## 2024-07-20 MED ORDER — MAGNESIUM SULFATE 2 GM/50ML IV SOLN
2.0000 g | Freq: Once | INTRAVENOUS | Status: AC
  Administered 2024-07-20: 2 g via INTRAVENOUS
  Filled 2024-07-20: qty 50

## 2024-07-20 MED ORDER — LEVETIRACETAM (KEPPRA) 500 MG/5 ML ADULT IV PUSH
1000.0000 mg | Freq: Once | INTRAVENOUS | Status: AC
  Administered 2024-07-20: 1000 mg via INTRAVENOUS
  Filled 2024-07-20: qty 10

## 2024-07-20 MED ORDER — SODIUM CHLORIDE 0.9% FLUSH: 10.0000 mL | INTRAVENOUS | Status: AC | PRN

## 2024-07-20 MED ORDER — LACTATED RINGERS IV SOLN: INTRAVENOUS | Status: AC

## 2024-07-20 MED ORDER — SODIUM CHLORIDE 0.9% FLUSH
10.0000 mL | Freq: Two times a day (BID) | INTRAVENOUS | Status: AC
  Administered 2024-07-20 – 2024-08-20 (×60): 10 mL
  Administered 2024-08-21: 40 mL
  Administered 2024-08-21: 10 mL
  Administered 2024-08-22: 20 mL
  Administered 2024-08-22 – 2024-08-23 (×2): 10 mL
  Administered 2024-08-23: 40 mL
  Administered 2024-08-24: 10 mL
  Administered 2024-08-24: 40 mL
  Administered 2024-08-25: 10 mL
  Administered 2024-08-25: 40 mL
  Administered 2024-08-26: 20 mL
  Administered 2024-08-26 – 2024-08-27 (×2): 10 mL
  Administered 2024-08-27 – 2024-08-28 (×2): 40 mL
  Administered 2024-08-28 – 2024-08-29 (×3): 10 mL
  Administered 2024-08-30: 40 mL
  Administered 2024-08-30: 20 mL
  Administered 2024-08-31 (×2): 10 mL
  Administered 2024-09-01: 40 mL
  Administered 2024-09-01 – 2024-09-02 (×3): 20 mL
  Administered 2024-09-03 – 2024-09-06 (×7): 10 mL
  Administered 2024-09-06: 20 mL
  Administered 2024-09-07 – 2024-09-13 (×12): 10 mL
  Administered 2024-09-14: 20 mL
  Administered 2024-09-14 – 2024-09-18 (×8): 10 mL
  Administered 2024-09-18: 20 mL
  Administered 2024-09-19: 10 mL
  Administered 2024-09-19 – 2024-09-20 (×2): 20 mL
  Administered 2024-09-20 – 2024-09-21 (×3): 10 mL
  Administered 2024-09-22: 20 mL
  Administered 2024-09-22 – 2024-09-29 (×14): 10 mL
  Administered 2024-09-29: 20 mL
  Administered 2024-09-30 – 2024-10-09 (×20): 10 mL

## 2024-07-20 MED ORDER — ATORVASTATIN CALCIUM 80 MG PO TABS
80.0000 mg | ORAL_TABLET | Freq: Every day | ORAL | Status: DC
  Administered 2024-07-22 – 2024-07-24 (×3): 80 mg via ORAL
  Filled 2024-07-20 (×3): qty 1

## 2024-07-20 MED ORDER — LABETALOL HCL 5 MG/ML IV SOLN
10.0000 mg | INTRAVENOUS | Status: DC | PRN
  Administered 2024-07-20 – 2024-07-22 (×3): 10 mg via INTRAVENOUS
  Filled 2024-07-20 (×3): qty 4

## 2024-07-20 NOTE — Progress Notes (Signed)
 PROGRESS NOTE    Brenda [name]  FMW:[id number] DOB: [birth year] DOA: 07/15/2024 PCP: [facility]   Brief Narrative:  63 year old female with history of metastatic endometrial cancer, chronic cancer-related pain, hypertension, hyperlipidemia, diabetes mellitus, depression, anxiety, CVA, history of DVT and PE anticoagulated on Lovenox , OSA, chronic hypoxic respiratory failure on 2L via nasal cannula and recent hospitalization and discharged on 07/14/2024 for UTI treated with IV antibiotics followed by oral antibiotics on discharge presented with chills and weakness.  She was found to have UTI and was started on IV cefepime .  Unfortunately mental status worsened and she was transferred to MICU for continuous EEG monitoring.  Assessment & Plan:  Acute metabolic encephalopathy ?  Subclinical seizures -Patient has had intermittent confusion since admission and has been slow to respond to secondary to UTI.  However on 11/15 CT head worsening status with the stiffening of upper extremities, patient transferred to [facility] for continuous video EEG.  MRI brain on 11/15 is negative for acute findings.  Shows evaluation of ischemic infarcts similar to before.   IV cefepime  could be culprit and this was discontinued. Husband today says when he called 911 pt was having similar symptoms of bilateral upper extremities movement and change in mentation Ongoing Continuous video EEG Addendum: 4 seizures noted on 11/16, keppra given 11/16 night. Started on keppra 500 bid per neurology  UTI: Present on admission, failed p.o. antibiotics - Blood and urine cultures negative so far.  Continue IV meropenem (start 11/15 -Husband adamant on repeat urine culture despite explaining that won't be helpful  Endometrial cancer with metastases Chronic cancer-related pain with opiate dependence - Outpatient follow-up with oncology/Dr. Lonn.  Continue current pain management. - Discussed with  patient sister-in-law.  She requested second opinion at [facility].  Referral will be sent for outpatient  Hypomagnesemia, will recheck  ?Hyperthyroidism, TSH is 0.23, will obtain free T4 level  Chronic respiratory failure with hypoxia OSA - Respiratory status stable.  Continue supplemental oxygen .  Continue CPAP at night  History of PE and DVT - Continue Lovenox   Diabetes mellitus type 2 with intermittent hypoglycemia - Continue long-acting insulin .  Carb modified diet when able.  CBGs with SSI.  Hypomagnesemia - Resolved  Anemia of chronic disease - From cancer.  Hemoglobin stable.  No signs of bleeding.  Monitor intermittently  Depression/anxiety - Prozac , Remeron, Haldol discontinued as discussed above.    History of CAD History of stroke Hyperlipidemia -Continue statin.  Not on aspirin  since she is on treatment dose Lovenox .  Obesity class I - Outpatient follow-up  Physical deconditioning - Likely will need SNF pending her clinical course.  Nutrition: start IVF hydration Daughter mentioned they had a  palliative care appointment today. Will consult inpatient  DVT prophylaxis: Lovenox  therapeutic dosing Code Status: DNR Family Communication: 2 daughters at the bedside, husband on the speaker phone disposition Plan: Status is:  inpatient because: Severity of illness    Consultants: None  Procedures: None  Antimicrobials: Cefepime  from 07/16/2024 -11/14, Meropenem 11/15-   Subjective:  Patient seen and examined at the bedside.  Lethargic.SABRA Doesn't respond.  Objective: Vitals:   07/20/24 0600 07/20/24 0735 07/20/24 0800 07/20/24 1212  BP: (!) 159/68 (!) 165/68  (!) 173/72  Pulse:  (!) 113 (!) 105 (!) 119  Resp:  (!) 24 18 (!) 24  Temp:  99.3 F (37.4 C)  98.7 F (37.1 C)  TempSrc:  Oral  Oral  SpO2:  100%  100%  Weight:  Height:        Intake/Output Summary (Last 24 hours) at 07/20/2024 1404 Last data filed at 07/20/2024 0600 Gross per 24 hour   Intake --  Output 800 ml  Net -800 ml   Filed Weights   07/16/24 1546 07/20/24 0500  Weight: 77.9 kg 81.8 kg    Examination:  General: lethargic, does not follow commands Chest: Clear CVs: S1, S2, no murmur, regular rhythm Abdomen: Soft, nontender Extremities: No edema Neuro: lethargic, does not follow commands, both upper extremity tremor, video EEG continuing   Data Reviewed: I have personally reviewed following labs and imaging studies  CBC: Recent Labs  Lab 07/16/24 0200 07/17/24 0600 07/18/24 0554 07/19/24 0245 07/20/24 1120  WBC 3.5* 4.2 4.8 5.5 11.8*  NEUTROABS 2.1  --  3.4 3.8 9.6*  HGB 9.5* 9.3* 10.4* 10.8* 12.2  HCT 29.7* 29.9* 33.2* 33.4* 37.5  MCV 84.9 86.2 86.2 83.1 83.0  PLT 281 253 232 230 280   Basic Metabolic Panel: Recent Labs  Lab 07/16/24 0200 07/17/24 0600 07/18/24 0554 07/19/24 0245 07/20/24 1120  NA 137 140 139 136 140  K 4.3 4.3 4.4 4.1 4.3  CL 102 104 103 101 108  CO2 26 27 24  19* 16*  GLUCOSE 238* 100* 189* 236* 288*  BUN 9 9 9 11 8   CREATININE 0.56 0.61 0.56 0.76 0.90  CALCIUM  9.6 9.4 9.4 9.1 9.6  MG  --  1.6* 1.8 1.5* 1.4*   GFR: Estimated Creatinine Clearance: 65.1 mL/min (by C-G formula based on SCr of 0.9 mg/dL). Liver Function Tests: Recent Labs  Lab 07/16/24 0200 07/19/24 0245  AST 22 25  ALT 23 29  ALKPHOS 83 76  BILITOT 0.2 1.2  PROT 6.8 7.0  ALBUMIN 3.5 3.2*   No results for input(s): LIPASE, AMYLASE in the last 168 hours. Recent Labs  Lab 07/19/24 0245  AMMONIA <13   Coagulation Profile: Recent Labs  Lab 07/16/24 0200  INR 1.0   Cardiac Enzymes: No results for input(s): CKTOTAL, CKMB, CKMBINDEX, TROPONINI in the last 168 hours. BNP (last 3 results) Recent Labs    05/22/24 0630  PROBNP 3,514.0*   HbA1C: No results for input(s): HGBA1C in the last 72 hours. CBG: Recent Labs  Lab 07/19/24 1247 07/19/24 1700 07/19/24 2011 07/20/24 0612 07/20/24 1212  GLUCAP 310* 270* 231*  274* 270*   Lipid Profile: No results for input(s): CHOL, HDL, LDLCALC, TRIG, CHOLHDL, LDLDIRECT in the last 72 hours. Thyroid  Function Tests: Recent Labs    07/19/24 0245 07/20/24 1120  TSH 0.230*  --   FREET4  --  1.41*   Anemia Panel: Recent Labs    07/19/24 0245  VITAMINB12 512  FOLATE 17.7   Sepsis Labs: Recent Labs  Lab 07/16/24 0207  LATICACIDVEN 0.6    Recent Results (from the past 240 hours)  Blood Culture (routine x 2)     Status: None   Collection Time: 07/12/24 11:05 PM   Specimen: BLOOD  Result Value Ref Range Status   Specimen Description BLOOD LEFT ANTECUBITAL  Final   Special Requests   Final    BOTTLES DRAWN AEROBIC AND ANAEROBIC Blood Culture adequate volume   Culture   Final    NO GROWTH 5 DAYS Performed at [facility], [name] [place], [place] [address]    Report Status 07/17/2024 FINAL  Final  Urine Culture     Status: Abnormal   Collection Time: 07/12/24 11:14 PM   Specimen: Urine, Random  Result Value Ref Range Status   Specimen Description URINE, RANDOM  Final   Special Requests   Final    NONE Reflexed from [id number] Performed at [facility], [address], [place], [place] [address]    Culture >=100,000 COLONIES/mL ENTEROBACTER CLOACAE (A)  Final   Report Status 07/15/2024 FINAL  Final   Organism ID, Bacteria ENTEROBACTER CLOACAE (A)  Final      Susceptibility   Enterobacter cloacae - MIC*    CEFEPIME  2 SENSITIVE Sensitive     CIPROFLOXACIN  <=0.06 SENSITIVE Sensitive     GENTAMICIN <=1 SENSITIVE Sensitive     NITROFURANTOIN 32 SENSITIVE Sensitive     TRIMETH /SULFA  <=20 SENSITIVE Sensitive     PIP/TAZO Value in next row Resistant      >=128 RESISTANTThis is a modified FDA-approved test that has been validated and its performance characteristics determined by the reporting laboratory.  This laboratory is certified under the Clinical Laboratory Improvement Amendments CLIA as qualified to perform high  complexity clinical laboratory testing.    MEROPENEM Value in next row Sensitive      >=128 RESISTANTThis is a modified FDA-approved test that has been validated and its performance characteristics determined by the reporting laboratory.  This laboratory is certified under the Clinical Laboratory Improvement Amendments CLIA as qualified to perform high complexity clinical laboratory testing.    * >=100,000 COLONIES/mL ENTEROBACTER CLOACAE  Blood Culture (routine x 2)     Status: None   Collection Time: 07/12/24 11:41 PM   Specimen: BLOOD  Result Value Ref Range Status   Specimen Description BLOOD RIGHT ANTECUBITAL  Final   Special Requests   Final    BOTTLES DRAWN AEROBIC AND ANAEROBIC Blood Culture results may not be optimal due to an inadequate volume of blood received in culture bottles   Culture   Final    NO GROWTH 5 DAYS Performed at [facility], [address] [place], [place] [address]    Report Status 07/18/2024 FINAL  Final  Urine Culture     Status: None   Collection Time: 07/16/24  1:43 AM   Specimen: Urine, Random  Result Value Ref Range Status   Specimen Description   Final    URINE, RANDOM Performed at [facility], [address], [place], [place] [address]    Special Requests   Final    NONE Reflexed from [id number] Performed at [facility], [address] [place], [place] [address]    Culture   Final    NO GROWTH Performed at [facility], [address], [place], [place] [address]    Report Status 07/17/2024 FINAL  Final  Blood Culture (routine x 2)     Status: None (Preliminary result)   Collection Time: 07/16/24  2:00 AM   Specimen: BLOOD  Result Value Ref Range Status   Specimen Description   Final    BLOOD RIGHT ANTECUBITAL Performed at [facility], [address] [place], [place] [address]    Special Requests   Final    Blood Culture adequate volume BOTTLES DRAWN AEROBIC AND  ANAEROBIC Performed at [facility], [address], [place], [place] [address]    Culture   Final    NO GROWTH 4 DAYS Performed at [facility], [address], [place], [place] [address]    Report Status PENDING  Incomplete  Blood Culture (routine x 2)     Status: None (  Preliminary result)   Collection Time: 07/16/24  2:00 AM   Specimen: BLOOD  Result Value Ref Range Status   Specimen Description   Final    BLOOD LEFT ANTECUBITAL Performed at [facility], [address], [place], [place] [address]    Special Requests   Final    Blood Culture adequate volume BOTTLES DRAWN AEROBIC AND ANAEROBIC Performed at [facility], [address], [place], [place] [address]    Culture   Final    NO GROWTH 4 DAYS Performed at [facility], [address], [place], [place] [address]    Report Status PENDING  Incomplete         Radiology Studies: Overnight EEG with video Result Date: 07/20/2024 Shelton Arlin KIDD, MD     07/20/2024 11:17 AM Patient Name: Brenda [name] MRN: [id number] Epilepsy Attending: Arlin KIDD Shelton Referring Physician/Provider: Khaliqdina, Salman, MD Duration: 07/19/2024 9292 to 07/20/2024 9292  Patient history: 63yo F with ams. EEG to evaluate for seizure  Level of alertness: Awake lethargic  AEDs during EEG study: LEV  Technical aspects: This EEG study was done with scalp electrodes positioned according to the 10-20 International system of electrode placement. Electrical activity was reviewed with band pass filter of 1-70Hz , sensitivity of 7 uV/mm, display speed of 9mm/sec with a 60Hz  notched filter applied as appropriate. EEG data were recorded continuously and digitally stored.  Video monitoring was available and reviewed as appropriate.  Description: EEG showed continuous generalized high amplitude sharply contoured 3 to 6 Hz theta-delta slowing.  Hyperventilation and photic stimulation were not  performed.   Patient was noted to have episodes of bilateral upper extremity jerking. Concomitant EEG showed generalized.  periodic discharges with triphasic morphology at 2.5 to 3 Hz and 4-5hz  theta slowing which gradually evolved into 2 to 3 Hz delta slowing admixed with generalized periodic discharges at 1 to 1.5 Hz.  Seizures were noted on 07/19/2024 at 0820, 1305, 1600 and 1922.  Average duration of seizures was 30 minutes to 1.5-hour at times.  Additionally EEG showed generalized periodic discharges with triphasic morphology with fluctuating frequency of 2 to 3 Hz ABNORMALITY - Seizures, generalized - Continuous slow, generalized  IMPRESSION: This study showed seizures with generalized onset during which patient was noted to bilateral upper extremity jerking.  4 seizures were noted on 07/19/2024 at 0820, 1305, 1600 and 1922 lasting between 30 minutes to 1.5 hours.  Additionally EEG showed generalized periodic discharges with triphasic morphology with fluctuating frequency of 2 to 3 Hz.  This EEG pattern is on the ictal-interictal continuum with high suspicion for ictal nature. Lastly there is generalized cerebral dysfunction (encephalopathy).  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 Dr. Matthews was notified. Arlin KIDD Shelton    EEG adult Result Date: 07/19/2024 Shelton Arlin KIDD, MD     07/19/2024  7:37 AM Patient Name: Dajanay Northrup MRN: [id number] Epilepsy Attending: Arlin KIDD Shelton Referring Physician/Provider: Cheryle Page, MD Date: 07/19/2024 Duration: 27.07 mins Patient history: 63yo F with ams. EEG to evaluate for seizure Level of alertness: Awake AEDs during EEG study: LEV Technical aspects: This EEG study was done with scalp electrodes positioned according to the 10-20 International system of electrode placement. Electrical activity was reviewed with band pass filter of 1-70Hz , sensitivity of 7 uV/mm, display speed of  7mm/sec with a 60Hz  notched filter applied as appropriate. EEG data were recorded continuously and digitally  stored.  Video monitoring was available and reviewed as appropriate. Description: EEG showed continuous generalized high amplitude sharply contoured 3 to 6 Hz theta-delta slowing.  Hyperventilation and photic stimulation were not performed.   ABNORMALITY - Continuous slow, generalized IMPRESSION: This study is suggestive of generalized cerebral dysfunction (encephalopathy). No seizures or epileptiform discharges were seen throughout the recording. Arlin MALVA Krebs   MR BRAIN WO CONTRAST Result Date: 07/18/2024 EXAM: MRI BRAIN WITHOUT CONTRAST 07/18/2024 06:36:54 PM TECHNIQUE: Multiplanar multisequence MRI of the head/brain was performed without the administration of intravenous contrast. COMPARISON: MRI head from earlier today. CLINICAL HISTORY: Mental status change, unknown cause FINDINGS: BRAIN AND VENTRICLES: Similar evolving infarcts in the anterior and posterior circulation as detailed on the recent priors. No new infarcts. No intracranial hemorrhage. No mass. No midline shift. No hydrocephalus. Normal flow voids. ORBITS: No acute abnormality. SINUSES AND MASTOIDS: No acute abnormality. BONES AND SOFT TISSUES: Normal marrow signal. IMPRESSION: 1. Similar evolving infarcts in the anterior and posterior circulation. 2. No new infarcts, progressive mass effect or acute hemorrhage. Electronically signed by: Gilmore Molt MD 07/18/2024 07:07 PM EST RP Workstation: [id number]        Scheduled Meds:  atorvastatin   80 mg Oral Daily   Chlorhexidine  Gluconate Cloth  6 each Topical Daily   enoxaparin   1 mg/kg Subcutaneous BID   insulin  aspart  0-9 Units Subcutaneous Q4H   insulin  glargine-yfgn  8 Units Subcutaneous Daily   levETIRAcetam  1,000 mg Intravenous Once   Followed by   [START ON 07/21/2024] levETIRAcetam  500 mg Intravenous Q12H   lidocaine   1 patch Transdermal Daily   sodium  chloride flush  10-40 mL Intracatheter Q12H   Continuous Infusions:  lactated ringers  75 mL/hr at 07/20/24 1109   magnesium  sulfate bolus IVPB     meropenem (MERREM) IV 1 g (07/20/24 0542)          Derryl Duval, MD [facility] 07/20/2024, 2:04 PM

## 2024-07-20 NOTE — Progress Notes (Addendum)
 Neurology progress note  S:  4 seizures on EEG yesterday on EEG lasting between 30-90 min Last seizure 1922 on 11/17 before she got keppra load  O:  Vitals:   07/20/24 1212 07/20/24 1548  BP: (!) 173/72 (!) 184/79  Pulse: (!) 119 (!) 112  Resp: (!) 24 (!) 22  Temp: 98.7 F (37.1 C) 98.8 F (37.1 C)  SpO2: 100% 100%   Gen: lying in bed eyes closed NAD Resp: normal WOB CV: extremities appear well-perfused  Neurologic exam  MS: arousable to mild physical stim but does not voluntarily open eyes or respond to commands Speech: no intelligible speech Cranial nerves:   CN II L pupil dilated and unreactive to light, R pupil is 3mm and sluggish reaction to light. Appears to blink to threat BL but inconsistently.   CN III,IV,VI EOM intact to dolls eyes, no gaze preference or deviation, no nystagmus   CN V Corneals intact BL   CN VII ?mild R facial droop, improved with facial grimace.   CN VIII Does not make eye contact to speech   CN IX & X Gag intact, appears to be protecting her airway at this time.   CN XI Head is midline   CN XII Does not protrude tongue on command.    Sensory/Motor:  Withdraws all extremities to proximal pinch.   Data  LTM EEG 11/16-11/17:  ABNORMALITY - Seizures, generalized - Continuous slow, generalized   IMPRESSION: This study showed seizures with generalized onset during which patient was noted to bilateral upper extremity jerking.  4 seizures were noted on 07/19/2024 at 0820, 1305, 1600 and 1922 lasting between 30 minutes to 1.5 hours.     Additionally EEG showed generalized periodic discharges with triphasic morphology with fluctuating frequency of 2 to 3 Hz.  This EEG pattern is on the ictal-interictal continuum with high suspicion for ictal nature.   Lastly there is generalized cerebral dysfunction (encephalopathy).  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   CT Head  without contrast(Personally reviewed): 1. Chronic encephalomalacia changes in the right medial temporal and occipital lobes developed in the interim. 2. Unchanged small calcified meningioma along left frontal convexity.   MRI Brain(Personally reviewed): 1. Similar evolving infarcts in the anterior and posterior circulation. 2. No new infarcts, progressive mass effect or acute hemorrhage.  A/P: EEG abnormalities favored to be 2/2 cefepime  toxicity. Clinical and electrographic seizures also favored to be driven primarily by cefepime  toxicity however given her prior (recent) stroke and recurrent UTIs her risk of recurrent seizure is high enough to justify continuation of AED at hospital discharge. Of note, husband is concerned that she actually had a seizure prior to admission bc a similar shaking episode is what caused him to call 911.  - S/p keppra load 11/17, continue 500mg  bid - Continue EEG until seizure free with no concerning ictal-interictal patterns x24 hrs - Cefepime  discontinued 11/16 AM, now on meropenem for UTI  Will continue to follow  [name], MD [facility] [phone]  If 7pm- 7am, please page neurology on call as listed in AMION.

## 2024-07-20 NOTE — Plan of Care (Signed)
 Assisted with linen change. New orders due to MEWS red. To obtain UA. Family at bedside.   Problem: Skin Integrity: Goal: Risk for impaired skin integrity will decrease Outcome: Progressing   Problem: Pain Managment: Goal: General experience of comfort will improve and/or be controlled Outcome: Progressing   Problem: Safety: Goal: Ability to remain free from injury will improve Outcome: Progressing

## 2024-07-20 NOTE — Progress Notes (Signed)
 Physical Therapy Treatment Patient Details Name: Brenda [name] MRN: [id number] DOB: 03/01/1961 Today's Date: 07/20/2024   History of Present Illness Brenda [name] is a 63 yo female who returned to hospital on 07/15/2024 with ongoing signs and symptoms of UTI with generalized weakness impacting pt functional mobility. Pt presented on 11/9 with urinary symptoms and AMS d/c home on OP ABX. Pt with 4 seizures on EEG 11/16. Working diagnosis of cefepime  neurotoxicity. Pt has had several prior hospitalizations this year.  Pt PMH includes but is not limited to: HTN, HLD, DM, depression/anxiety, CVA (CIR recently), DVT/PE, OSA on CPAP, chronic respiratory failure on 2 L/min at baseline, and metastatic endometrial cancer with chronic cancer-related pain.    PT Comments  Unfortunately, pt with significant regression towards physical therapy goals. Noted EEG showing four seizures on 11/16. Pt spouse present at bedside and supportive. Pt not visually tracking, but blinks to threat (slowed on L; has R gaze preference. Pt demonstrates rigidity with increased flexor tone in all extremities. Placed bed in chair position to promote upright and worked on PROM with fair success in addressing tone. Placed washcloth in bilateral hands to prevent skin breakdown and pillow propped on L side. Recommend q2 turns and PRAFO boots. May benefit from air mattress placement in remains immobile. Will continue to assess.     If plan is discharge home, recommend the following: Two people to help with walking and/or transfers;Two people to help with bathing/dressing/bathroom;Assistance with feeding   Can travel by private vehicle     No  Equipment Recommendations  Hospital bed;Hoyer lift    Recommendations for Other Services       Precautions / Restrictions Precautions Precautions: Fall;Other (comment) Recall of Precautions/Restrictions: Impaired Precaution/Restrictions Comments: Seizure, NGT Restrictions Weight  Bearing Restrictions Per Provider Order: No     Mobility  Bed Mobility Overal bed mobility: Needs Assistance Bed Mobility: Rolling Rolling: Total assist              Transfers                   General transfer comment: deferred    Ambulation/Gait                   Stairs             Wheelchair Mobility     Tilt Bed    Modified Rankin (Stroke Patients Only)       Balance                                            Communication Communication Communication:  (nonverbal)  Cognition Arousal: Alert Behavior During Therapy: Flat affect   PT - Cognitive impairments: Difficult to assess                       PT - Cognition Comments: Pt not visually tracking or following any commands Following commands: Impaired Following commands impaired:  (no command following)    Cueing Cueing Techniques: Verbal cues  Exercises General Exercises - Upper Extremity Shoulder Flexion: PROM, Both, 10 reps, Supine Elbow Extension: PROM, Both, 10 reps, Supine Composite Extension: PROM, Both, 10 reps, Supine General Exercises - Lower Extremity Ankle Circles/Pumps: PROM, Both, 10 reps, Supine Short Arc Quad: PROM, Both, 10 reps, Supine Other Exercises Other Exercises: Bed in chair position: cervical stretching to neutral  General Comments        Pertinent Vitals/Pain Pain Assessment Pain Assessment: PAINAD Breathing: normal Negative Vocalization: none Facial Expression: smiling or inexpressive Body Language: rigid, fists clenched, knees up, pushing/pulling away, strikes out Consolability: no need to console PAINAD Score: 2    Home Living                          Prior Function            PT Goals (current goals can now be found in the care plan section) Acute Rehab PT Goals Patient Stated Goal: pt spouse would like her to be able to drink/eat Potential to Achieve Goals: Poor Progress towards PT  goals: Not progressing toward goals - comment    Frequency    Min 2X/week      PT Plan      Co-evaluation              AM-PAC PT 6 Clicks Mobility   Outcome Measure  Help needed turning from your back to your side while in a flat bed without using bedrails?: Total Help needed moving from lying on your back to sitting on the side of a flat bed without using bedrails?: Total Help needed moving to and from a bed to a chair (including a wheelchair)?: Total Help needed standing up from a chair using your arms (e.g., wheelchair or bedside chair)?: Total Help needed to walk in hospital room?: Total Help needed climbing 3-5 steps with a railing? : Total 6 Click Score: 6    End of Session Equipment Utilized During Treatment: Oxygen  Activity Tolerance: Other (comment) (limited due to cognition) Patient left: in bed;with call bell/phone within reach;with bed alarm set;with family/visitor present Nurse Communication: Mobility status PT Visit Diagnosis: Other abnormalities of gait and mobility (R26.89);Muscle weakness (generalized) (M62.81);Unsteadiness on feet (R26.81);History of falling (Z91.81)     Time: 8471-8449 PT Time Calculation (min) (ACUTE ONLY): 22 min  Charges:    $Therapeutic Exercise: 8-22 mins PT General Charges $$ ACUTE PT VISIT: 1 Visit                     Aleck Daring, PT, DPT Acute Rehabilitation Services Office [phone]    Aleck ONEIDA Daring 07/20/2024, 3:59 PM

## 2024-07-20 NOTE — Plan of Care (Signed)

## 2024-07-20 NOTE — Inpatient Diabetes Management (Signed)
 Inpatient Diabetes Program Recommendations  AACE/ADA: New Consensus Statement on Inpatient Glycemic Control   Target Ranges:  Prepandial:   less than 140 mg/dL      Peak postprandial:   less than 180 mg/dL (1-2 hours)      Critically ill patients:  140 - 180 mg/dL   Lab Results  Component Value Date   GLUCAP 274 (H) 07/20/2024   HGBA1C 7.2 (H) 06/01/2024    Latest Reference Range & Units 07/19/24 02:51 07/19/24 06:12 07/19/24 12:47 07/19/24 17:00 07/19/24 20:11 07/20/24 06:12  Glucose-Capillary 70 - 99 mg/dL 793 (H) 727 (H) 689 (H) 270 (H) 231 (H) 274 (H)   Review of Glycemic Control  Diabetes history: DM2  Outpatient Diabetes medications:  Tresiba 18 units every day Novolog  1-3 units TID Metformin 1000 mg BID  Current orders for Inpatient glycemic control:  Semglee  8 units every day Novolog  0-9 units Q4HRS   Inpatient Diabetes Program Recommendations:   Please consider increasing Semglee  to 15 units daily   Thanks,  Lavanda Search, RN, MSN, [facility]  Inpatient Diabetes Coordinator  Pager [phone] (8a-5p)

## 2024-07-20 NOTE — Procedures (Addendum)
 Patient Name: Brenda [name]  MRN: [id number]  Epilepsy Attending: Arlin MALVA Krebs  Referring Physician/Provider: Khaliqdina, Salman, MD  Duration: 07/19/2024 9292 to 07/20/2024 9292   Patient history: 63yo F with ams. EEG to evaluate for seizure   Level of alertness: Awake lethargic   AEDs during EEG study: LEV   Technical aspects: This EEG study was done with scalp electrodes positioned according to the 10-20 International system of electrode placement. Electrical activity was reviewed with band pass filter of 1-70Hz , sensitivity of 7 uV/mm, display speed of 71mm/sec with a 60Hz  notched filter applied as appropriate. EEG data were recorded continuously and digitally stored.  Video monitoring was available and reviewed as appropriate.   Description: EEG showed continuous generalized high amplitude sharply contoured 3 to 6 Hz theta-delta slowing.  Hyperventilation and photic stimulation were not performed.     Patient was noted to have episodes of bilateral upper extremity jerking. Concomitant EEG showed generalized.  periodic discharges with triphasic morphology at 2.5 to 3 Hz and 4-5hz  theta slowing which gradually evolved into 2 to 3 Hz delta slowing admixed with generalized periodic discharges at 1 to 1.5 Hz.  Seizures were noted on 07/19/2024 at 0820, 1305, 1600 and 1922.  Average duration of seizures was 30 minutes to 1.5-hour at times.    Additionally EEG showed generalized periodic discharges with triphasic morphology with fluctuating frequency of 2 to 3 Hz  ABNORMALITY - Seizures, generalized - Continuous slow, generalized   IMPRESSION: This study showed seizures with generalized onset during which patient was noted to bilateral upper extremity jerking.  4 seizures were noted on 07/19/2024 at 0820, 1305, 1600 and 1922 lasting between 30 minutes to 1.5 hours.    Additionally EEG showed generalized periodic discharges with triphasic morphology with fluctuating frequency of 2 to  3 Hz.  This EEG pattern is on the ictal-interictal continuum with high suspicion for ictal nature.  Lastly there is generalized cerebral dysfunction (encephalopathy).  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  Dr. Matthews was notified.   [name] [name]

## 2024-07-20 NOTE — Progress Notes (Signed)
 Orthopedic Tech Progress Note Patient Details:  Brenda [name] October 13, 1960 [id number] RN was administering meds when I walked in while patient was lying down. Left shoes at bedside. Ortho Devices Type of Ortho Device: Postop shoe/boot Ortho Device/Splint Location: BLE Ortho Device/Splint Interventions: Ordered   Post Interventions Patient Tolerated: Well Instructions Provided: Care of device  Blayton Huttner L [name] 07/20/2024, 4:06 PM

## 2024-07-20 NOTE — Progress Notes (Signed)
   07/20/24 2013  BiPAP/CPAP/SIPAP  BiPAP/CPAP/SIPAP Pt Type Adult  Reason BIPAP/CPAP not in use Other(comment) (EEG)

## 2024-07-20 NOTE — Progress Notes (Signed)
 LTM maint complete - no skin breakdown under: QE8,QE7,Q1. Files that were local moved back to study

## 2024-07-21 ENCOUNTER — Inpatient Hospital Stay: Admitting: Licensed Clinical Social Worker

## 2024-07-21 ENCOUNTER — Encounter: Payer: Self-pay | Admitting: Licensed Clinical Social Worker

## 2024-07-21 ENCOUNTER — Inpatient Hospital Stay (HOSPITAL_COMMUNITY)

## 2024-07-21 DIAGNOSIS — Z7189 Other specified counseling: Secondary | ICD-10-CM

## 2024-07-21 DIAGNOSIS — Z515 Encounter for palliative care: Secondary | ICD-10-CM

## 2024-07-21 DIAGNOSIS — R569 Unspecified convulsions: Secondary | ICD-10-CM | POA: Diagnosis not present

## 2024-07-21 DIAGNOSIS — D329 Benign neoplasm of meninges, unspecified: Secondary | ICD-10-CM

## 2024-07-21 DIAGNOSIS — Z66 Do not resuscitate: Secondary | ICD-10-CM

## 2024-07-21 DIAGNOSIS — G9341 Metabolic encephalopathy: Secondary | ICD-10-CM | POA: Diagnosis not present

## 2024-07-21 DIAGNOSIS — T361X4A Poisoning by cephalosporins and other beta-lactam antibiotics, undetermined, initial encounter: Secondary | ICD-10-CM | POA: Diagnosis not present

## 2024-07-21 DIAGNOSIS — R4182 Altered mental status, unspecified: Secondary | ICD-10-CM | POA: Diagnosis not present

## 2024-07-21 DIAGNOSIS — N390 Urinary tract infection, site not specified: Secondary | ICD-10-CM | POA: Diagnosis not present

## 2024-07-21 LAB — CBC WITH DIFFERENTIAL/PLATELET
Abs Immature Granulocytes: 0.02 K/uL (ref 0.00–0.07)
Basophils Absolute: 0.1 K/uL (ref 0.0–0.1)
Basophils Relative: 1 %
Eosinophils Absolute: 0 K/uL (ref 0.0–0.5)
Eosinophils Relative: 0 %
HCT: 36.5 % (ref 36.0–46.0)
Hemoglobin: 11.6 g/dL — ABNORMAL LOW (ref 12.0–15.0)
Immature Granulocytes: 0 %
Lymphocytes Relative: 17 %
Lymphs Abs: 1.6 K/uL (ref 0.7–4.0)
MCH: 26.7 pg (ref 26.0–34.0)
MCHC: 31.8 g/dL (ref 30.0–36.0)
MCV: 83.9 fL (ref 80.0–100.0)
Monocytes Absolute: 0.7 K/uL (ref 0.1–1.0)
Monocytes Relative: 7 %
Neutro Abs: 7.1 K/uL (ref 1.7–7.7)
Neutrophils Relative %: 75 %
Platelets: 257 K/uL (ref 150–400)
RBC: 4.35 MIL/uL (ref 3.87–5.11)
RDW: 16.6 % — ABNORMAL HIGH (ref 11.5–15.5)
WBC: 9.4 K/uL (ref 4.0–10.5)
nRBC: 0 % (ref 0.0–0.2)

## 2024-07-21 LAB — URINALYSIS, W/ REFLEX TO CULTURE (INFECTION SUSPECTED)
Bilirubin Urine: NEGATIVE
Glucose, UA: >=500 mg/dL — AB
Ketones, ur: 80 mg/dL — AB
Leukocytes,Ua: NEGATIVE
Nitrite: NEGATIVE
Protein, ur: >=300 mg/dL — AB
Specific Gravity, Urine: 1.024 (ref 1.005–1.030)
pH: 5 (ref 5.0–8.0)

## 2024-07-21 LAB — CULTURE, BLOOD (ROUTINE X 2)
Culture: NO GROWTH
Culture: NO GROWTH
Special Requests: ADEQUATE
Special Requests: ADEQUATE

## 2024-07-21 LAB — GLUCOSE, CAPILLARY
Glucose-Capillary: 237 mg/dL — ABNORMAL HIGH (ref 70–99)
Glucose-Capillary: 242 mg/dL — ABNORMAL HIGH (ref 70–99)
Glucose-Capillary: 249 mg/dL — ABNORMAL HIGH (ref 70–99)
Glucose-Capillary: 267 mg/dL — ABNORMAL HIGH (ref 70–99)
Glucose-Capillary: 277 mg/dL — ABNORMAL HIGH (ref 70–99)
Glucose-Capillary: 288 mg/dL — ABNORMAL HIGH (ref 70–99)
Glucose-Capillary: 302 mg/dL — ABNORMAL HIGH (ref 70–99)

## 2024-07-21 MED ORDER — FREE WATER: 100.0000 mL | Freq: Three times a day (TID) | Status: DC

## 2024-07-21 MED ORDER — INSULIN GLARGINE-YFGN 100 UNIT/ML ~~LOC~~ SOLN
12.0000 [IU] | Freq: Once | SUBCUTANEOUS | Status: AC
  Administered 2024-07-21: 12 [IU] via SUBCUTANEOUS
  Filled 2024-07-21: qty 0.12

## 2024-07-21 MED ORDER — INSULIN GLARGINE-YFGN 100 UNIT/ML ~~LOC~~ SOLN
20.0000 [IU] | Freq: Every day | SUBCUTANEOUS | Status: DC
  Administered 2024-07-22: 20 [IU] via SUBCUTANEOUS
  Filled 2024-07-21 (×2): qty 0.2

## 2024-07-21 MED ORDER — LEVETIRACETAM 500 MG PO TABS: 500.0000 mg | ORAL_TABLET | Freq: Two times a day (BID) | ORAL | Status: DC

## 2024-07-21 MED ORDER — INSULIN ASPART 100 UNIT/ML IJ SOLN
0.0000 [IU] | INTRAMUSCULAR | Status: DC
  Administered 2024-07-21 (×2): 8 [IU] via SUBCUTANEOUS
  Administered 2024-07-22: 5 [IU] via SUBCUTANEOUS
  Administered 2024-07-22: 3 [IU] via SUBCUTANEOUS
  Administered 2024-07-22: 5 [IU] via SUBCUTANEOUS
  Administered 2024-07-22 (×2): 2 [IU] via SUBCUTANEOUS
  Administered 2024-07-22: 3 [IU] via SUBCUTANEOUS
  Administered 2024-07-23 (×2): 11 [IU] via SUBCUTANEOUS
  Administered 2024-07-23 (×2): 5 [IU] via SUBCUTANEOUS
  Administered 2024-07-23: 8 [IU] via SUBCUTANEOUS
  Administered 2024-07-23 – 2024-07-24 (×3): 5 [IU] via SUBCUTANEOUS
  Administered 2024-07-24 (×2): 8 [IU] via SUBCUTANEOUS
  Administered 2024-07-25: 2 [IU] via SUBCUTANEOUS
  Administered 2024-07-25 (×2): 3 [IU] via SUBCUTANEOUS
  Administered 2024-07-25: 5 [IU] via SUBCUTANEOUS
  Administered 2024-07-25: 8 [IU] via SUBCUTANEOUS
  Administered 2024-07-25: 5 [IU] via SUBCUTANEOUS
  Administered 2024-07-26 (×2): 2 [IU] via SUBCUTANEOUS
  Administered 2024-07-26: 3 [IU] via SUBCUTANEOUS
  Administered 2024-07-26: 5 [IU] via SUBCUTANEOUS
  Administered 2024-07-26 (×2): 3 [IU] via SUBCUTANEOUS
  Administered 2024-07-26 – 2024-07-27 (×3): 2 [IU] via SUBCUTANEOUS
  Administered 2024-07-27: 3 [IU] via SUBCUTANEOUS
  Administered 2024-07-27: 5 [IU] via SUBCUTANEOUS
  Administered 2024-07-28: 3 [IU] via SUBCUTANEOUS
  Administered 2024-07-28 (×2): 2 [IU] via SUBCUTANEOUS
  Administered 2024-07-28: 3 [IU] via SUBCUTANEOUS
  Administered 2024-07-28 (×2): 2 [IU] via SUBCUTANEOUS
  Administered 2024-07-29 (×2): 3 [IU] via SUBCUTANEOUS
  Administered 2024-07-29: 2 [IU] via SUBCUTANEOUS
  Administered 2024-07-29: 3 [IU] via SUBCUTANEOUS
  Administered 2024-07-29: 2 [IU] via SUBCUTANEOUS
  Administered 2024-07-29: 3 [IU] via SUBCUTANEOUS
  Administered 2024-07-30: 5 [IU] via SUBCUTANEOUS
  Administered 2024-07-30: 3 [IU] via SUBCUTANEOUS
  Administered 2024-07-30: 2 [IU] via SUBCUTANEOUS
  Administered 2024-07-30: 5 [IU] via SUBCUTANEOUS
  Administered 2024-07-31 (×2): 3 [IU] via SUBCUTANEOUS
  Administered 2024-07-31: 5 [IU] via SUBCUTANEOUS
  Administered 2024-07-31 (×2): 2 [IU] via SUBCUTANEOUS
  Administered 2024-07-31: 8 [IU] via SUBCUTANEOUS
  Administered 2024-08-01: 5 [IU] via SUBCUTANEOUS
  Administered 2024-08-01 – 2024-08-02 (×4): 3 [IU] via SUBCUTANEOUS
  Administered 2024-08-02: 5 [IU] via SUBCUTANEOUS
  Administered 2024-08-02: 3 [IU] via SUBCUTANEOUS
  Administered 2024-08-03 (×2): 2 [IU] via SUBCUTANEOUS
  Administered 2024-08-03 (×2): 5 [IU] via SUBCUTANEOUS
  Administered 2024-08-04: 2 [IU] via SUBCUTANEOUS
  Administered 2024-08-04: 3 [IU] via SUBCUTANEOUS
  Administered 2024-08-04: 2 [IU] via SUBCUTANEOUS
  Administered 2024-08-04 – 2024-08-05 (×2): 3 [IU] via SUBCUTANEOUS
  Administered 2024-08-05: 5 [IU] via SUBCUTANEOUS
  Administered 2024-08-05: 3 [IU] via SUBCUTANEOUS
  Administered 2024-08-05: 5 [IU] via SUBCUTANEOUS
  Administered 2024-08-06 (×2): 3 [IU] via SUBCUTANEOUS
  Administered 2024-08-06: 2 [IU] via SUBCUTANEOUS
  Administered 2024-08-06 (×2): 3 [IU] via SUBCUTANEOUS
  Administered 2024-08-07 – 2024-08-08 (×4): 2 [IU] via SUBCUTANEOUS
  Administered 2024-08-08 (×2): 3 [IU] via SUBCUTANEOUS
  Administered 2024-08-09 – 2024-08-10 (×2): 2 [IU] via SUBCUTANEOUS
  Administered 2024-08-10: 3 [IU] via SUBCUTANEOUS
  Administered 2024-08-10 (×2): 2 [IU] via SUBCUTANEOUS
  Filled 2024-07-21: qty 8
  Filled 2024-07-21: qty 5
  Filled 2024-07-21: qty 3
  Filled 2024-07-21: qty 2
  Filled 2024-07-21: qty 5
  Filled 2024-07-21: qty 8
  Filled 2024-07-21: qty 2
  Filled 2024-07-21: qty 5
  Filled 2024-07-21: qty 3
  Filled 2024-07-21 (×2): qty 2
  Filled 2024-07-21: qty 3
  Filled 2024-07-21: qty 5
  Filled 2024-07-21: qty 3
  Filled 2024-07-21: qty 2
  Filled 2024-07-21: qty 8
  Filled 2024-07-21 (×3): qty 2
  Filled 2024-07-21: qty 5
  Filled 2024-07-21: qty 2
  Filled 2024-07-21: qty 3
  Filled 2024-07-21: qty 5
  Filled 2024-07-21: qty 2
  Filled 2024-07-21: qty 5
  Filled 2024-07-21 (×2): qty 2
  Filled 2024-07-21 (×2): qty 3
  Filled 2024-07-21: qty 2
  Filled 2024-07-21 (×3): qty 5
  Filled 2024-07-21: qty 3
  Filled 2024-07-21: qty 5
  Filled 2024-07-21: qty 2
  Filled 2024-07-21: qty 8
  Filled 2024-07-21: qty 2
  Filled 2024-07-21: qty 8
  Filled 2024-07-21: qty 3
  Filled 2024-07-21: qty 11
  Filled 2024-07-21 (×2): qty 3
  Filled 2024-07-21 (×3): qty 5
  Filled 2024-07-21 (×2): qty 3
  Filled 2024-07-21: qty 2
  Filled 2024-07-21: qty 3
  Filled 2024-07-21: qty 2
  Filled 2024-07-21 (×3): qty 3
  Filled 2024-07-21: qty 2
  Filled 2024-07-21: qty 3
  Filled 2024-07-21 (×2): qty 5
  Filled 2024-07-21 (×4): qty 3
  Filled 2024-07-21: qty 2
  Filled 2024-07-21: qty 11
  Filled 2024-07-21: qty 2
  Filled 2024-07-21 (×2): qty 3
  Filled 2024-07-21: qty 4
  Filled 2024-07-21 (×2): qty 2
  Filled 2024-07-21: qty 3
  Filled 2024-07-21: qty 2
  Filled 2024-07-21 (×4): qty 3
  Filled 2024-07-21 (×2): qty 2
  Filled 2024-07-21: qty 5
  Filled 2024-07-21: qty 2
  Filled 2024-07-21: qty 5
  Filled 2024-07-21: qty 8
  Filled 2024-07-21 (×3): qty 5
  Filled 2024-07-21: qty 2
  Filled 2024-07-21: qty 3
  Filled 2024-07-21: qty 5

## 2024-07-21 MED ORDER — LACTATED RINGERS IV SOLN: INTRAVENOUS | Status: DC

## 2024-07-21 MED ORDER — LEVETIRACETAM (KEPPRA) 500 MG/5 ML ADULT IV PUSH
500.0000 mg | Freq: Two times a day (BID) | INTRAVENOUS | Status: DC
  Administered 2024-07-21 – 2024-07-22 (×2): 500 mg via INTRAVENOUS
  Filled 2024-07-21 (×2): qty 5

## 2024-07-21 NOTE — Progress Notes (Signed)
   07/21/24 2042  BiPAP/CPAP/SIPAP  BiPAP/CPAP/SIPAP Pt Type Adult  Reason BIPAP/CPAP not in use Other(comment) (EEG in place)

## 2024-07-21 NOTE — Progress Notes (Signed)
 Speech Language Pathology Treatment: Dysphagia  Patient Details Name: Brenda [name] MRN: [id number] DOB: 1960/09/08 Today's Date: 07/21/2024 Time: 8981-8966 SLP Time Calculation (min) (ACUTE ONLY): 15 min  Assessment / Plan / Recommendation Clinical Impression  Pt has had a decline since previous ST session with colleague. Youngest daughter stated she has not been alert and not consumed po's since Friday. Pt lethargic, briefly opens eyes on command but closed throughout session. SLP set up oral suction and provided oral care removing mild-moderate amount dried mucous- some between faucial arches not ready to release. Pt with frequent gagging with toothette touched to tip and mid tongue but able to tolerate oral care. Not alert to attempt po's but she did slightly move lips to close around dry spoon however incomplete and not functional. Currently she is unable to consume po's given decreased responsiveness. Palliative care meeting with family today to discuss goals of care. Continue frequent oral care. SLP will check back (likely Thurs) following Palliative care meeting. If ST needed before then, please reach out.     HPI HPI: Brenda [name] is a 63 y.o. 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  She presents to [facility] ED with reports of chills and weakness preventing her from ambulating.  Note she was recently discharged from [facility] where she was treated for a UTI and discharged on cefadroxil . Dx with recurrent UTI secondary to failed antibiotic attempt.      SLP Plan  Continue with current plan of care          Recommendations  Diet recommendations: Dysphagia 3 (mechanical soft);Thin liquid (until  Palliative care mtng)                  Oral care QID   Frequent or constant Supervision/Assistance Dysphagia, oropharyngeal phase (R13.12)     Continue with current plan of care     Dustin Olam Bull  07/21/2024, 10:41 AM

## 2024-07-21 NOTE — Progress Notes (Addendum)
 PROGRESS NOTE    Brenda [name]  FMW:[id number] DOB: 20-May-1961 DOA: 07/15/2024 PCP: [facility]   Brief Narrative:  63 year old female with history of metastatic endometrial cancer, chronic cancer-related pain, hypertension, hyperlipidemia, diabetes mellitus, depression, anxiety, CVA, history of DVT and PE anticoagulated on Lovenox , OSA, chronic hypoxic respiratory failure on 2L via nasal cannula and recent hospitalization and discharged on 07/14/2024 for UTI treated with IV antibiotics followed by oral antibiotics on discharge presented with chills and weakness.  She was found to have UTI and was started on IV cefepime .  Unfortunately mental status worsened and she was transferred to [facility] for continuous EEG monitoring.  Assessment & Plan:  Acute metabolic encephalopathy Subclinical seizures -Patient has had intermittent confusion since admission and has been slow to respond to secondary to UTI.  However on 11/15 CT head worsening status with the stiffening of upper extremities, patient transferred to [facility] for continuous video EEG.  MRI brain on 11/15 is negative for acute findings.  Shows evolution of ischemic infarcts similar to before.   IV cefepime  could be culprit and this was discontinued. Husband today says when he called 911 pt was having similar symptoms of bilateral upper extremities movement and change in mentation Ongoing Continuous video EEG 4 seizures noted on 11/16, keppra given 11/16 night. Started on keppra 500 bid per neurology  UTI: Present on admission, failed p.o. antibiotics - Blood and urine cultures negative so far.  Continue IV meropenem (start 11/15. Will complete for 7 days  Low-grade temperature: Port site appears normal. wbc is elevated to 11.8 yesterday with low-grade temperature 100. Will get Blood culture, repeat CBC  Endometrial cancer with metastases Chronic cancer-related pain with opiate dependence.  Will continue fentanyl , oxy  as needed for pain. - Outpatient follow-up with oncology/Dr. Lonn.  Continue current pain management. - family requested second opinion at [facility].  Referral will be sent for outpatient  Hypomagnesemia, will recheck  ?Hyperthyroidism, TSH is 0.23,  free T4 level elevated 1.4  Chronic respiratory failure with hypoxia OSA - Respiratory status stable.  Continue supplemental oxygen .  Continue CPAP at night  History of PE and DVT - Continue Lovenox   Diabetes mellitus type 2 with intermittent hypoglycemia - Blood sugars are uncontrolled. Will increase Lantus  to 20 units daily and continue with sliding scale insulin  coverage.  Hypomagnesemia - Resolved  Anemia of chronic disease - From cancer.  Hemoglobin stable.  No signs of bleeding.  Monitor intermittently  Depression/anxiety - Prozac , Remeron, Haldol discontinued as discussed above.    History of CAD History of stroke Hyperlipidemia -Continue statin.  Not on aspirin  since she is on treatment dose Lovenox .  Obesity class I - Outpatient follow-up  Physical deconditioning - Likely will need SNF pending her clinical course.  Nutrition: start IVF hydration, will start NG feedings if consistent with goals of care  Palliative care consult is pending.  DVT prophylaxis: Lovenox  therapeutic dosing Code Status: DNR Family Communication: daughter at the bedside, husband on the speaker phone disposition Plan: Status is:  inpatient because: Severity of illness    Consultants: Neuro  Procedures: None  Antimicrobials: Cefepime  from 07/16/2024 -11/14, Meropenem 11/15-   Subjective:  Patient seen and examined at the bedside.  Lethargic.SABRA  Poorly responding.  Hypertensive.  Discussed with daughter and husband(speaker phone).  Husband concerned about low-grade temperature as well as leukocytosis.  Will obtain blood culture to rule out bacteremia.  Repeat CBC Objective: Vitals:   07/20/24 2030 07/21/24 0010 07/21/24 0404  07/21/24 0500  BP: (!) 177/88 (!) 143/90 (!) 174/73   Pulse: (!) 102 (!) 111 (!) 108   Resp: 20 19 20    Temp: 98.9 F (37.2 C) 99.6 F (37.6 C) 100 F (37.8 C)   TempSrc: Oral Oral Oral   SpO2: 100% 100% 100%   Weight:    80.2 kg  Height:        Intake/Output Summary (Last 24 hours) at 07/21/2024 0757 Last data filed at 07/20/2024 2218 Gross per 24 hour  Intake 10 ml  Output 850 ml  Net -840 ml   Filed Weights   07/16/24 1546 07/20/24 0500 07/21/24 0500  Weight: 77.9 kg 81.8 kg 80.2 kg    Examination:  General: lethargic, does not follow commands Chest: Clear CVs: S1, S2, no murmur, regular rhythm Abdomen: Soft, nontender Extremities: No edema Neuro: lethargic, does not follow commands, both upper extremity tremor, video EEG continuing   Data Reviewed: I have personally reviewed following labs and imaging studies  CBC: Recent Labs  Lab 07/16/24 0200 07/17/24 0600 07/18/24 0554 07/19/24 0245 07/20/24 1120  WBC 3.5* 4.2 4.8 5.5 11.8*  NEUTROABS 2.1  --  3.4 3.8 9.6*  HGB 9.5* 9.3* 10.4* 10.8* 12.2  HCT 29.7* 29.9* 33.2* 33.4* 37.5  MCV 84.9 86.2 86.2 83.1 83.0  PLT 281 253 232 230 280   Basic Metabolic Panel: Recent Labs  Lab 07/16/24 0200 07/17/24 0600 07/18/24 0554 07/19/24 0245 07/20/24 1120  NA 137 140 139 136 140  K 4.3 4.3 4.4 4.1 4.3  CL 102 104 103 101 108  CO2 26 27 24  19* 16*  GLUCOSE 238* 100* 189* 236* 288*  BUN 9 9 9 11 8   CREATININE 0.56 0.61 0.56 0.76 0.90  CALCIUM  9.6 9.4 9.4 9.1 9.6  MG  --  1.6* 1.8 1.5* 1.4*   GFR: Estimated Creatinine Clearance: 64.4 mL/min (by C-G formula based on SCr of 0.9 mg/dL). Liver Function Tests: Recent Labs  Lab 07/16/24 0200 07/19/24 0245  AST 22 25  ALT 23 29  ALKPHOS 83 76  BILITOT 0.2 1.2  PROT 6.8 7.0  ALBUMIN 3.5 3.2*   No results for input(s): LIPASE, AMYLASE in the last 168 hours. Recent Labs  Lab 07/19/24 0245  AMMONIA <13   Coagulation Profile: Recent Labs  Lab  07/16/24 0200  INR 1.0   Cardiac Enzymes: No results for input(s): CKTOTAL, CKMB, CKMBINDEX, TROPONINI in the last 168 hours. BNP (last 3 results) Recent Labs    05/22/24 0630  PROBNP 3,514.0*   HbA1C: No results for input(s): HGBA1C in the last 72 hours. CBG: Recent Labs  Lab 07/20/24 1629 07/20/24 2132 07/21/24 0128 07/21/24 0404 07/21/24 0736  GLUCAP 265* 258* 288* 302* 267*   Lipid Profile: No results for input(s): CHOL, HDL, LDLCALC, TRIG, CHOLHDL, LDLDIRECT in the last 72 hours. Thyroid  Function Tests: Recent Labs    07/19/24 0245 07/20/24 1120  TSH 0.230*  --   FREET4  --  1.41*   Anemia Panel: Recent Labs    07/19/24 0245  VITAMINB12 512  FOLATE 17.7   Sepsis Labs: Recent Labs  Lab 07/16/24 0207  LATICACIDVEN 0.6    Recent Results (from the past 240 hours)  Blood Culture (routine x 2)     Status: None   Collection Time: 07/12/24 11:05 PM   Specimen: BLOOD  Result Value Ref Range Status   Specimen Description BLOOD LEFT ANTECUBITAL  Final   Special Requests   Final  BOTTLES DRAWN AEROBIC AND ANAEROBIC Blood Culture adequate volume   Culture   Final    NO GROWTH 5 DAYS Performed at [facility], [address] [place], [place] [address]    Report Status 07/17/2024 FINAL  Final  Urine Culture     Status: Abnormal   Collection Time: 07/12/24 11:14 PM   Specimen: Urine, Random  Result Value Ref Range Status   Specimen Description URINE, RANDOM  Final   Special Requests   Final    NONE Reflexed from [id number] Performed at [facility], [address] [place], [place] [address]    Culture >=100,000 COLONIES/mL ENTEROBACTER CLOACAE (A)  Final   Report Status 07/15/2024 FINAL  Final   Organism ID, Bacteria ENTEROBACTER CLOACAE (A)  Final      Susceptibility   Enterobacter cloacae - MIC*    CEFEPIME  2 SENSITIVE Sensitive     CIPROFLOXACIN  <=0.06 SENSITIVE Sensitive     GENTAMICIN <=1 SENSITIVE Sensitive      NITROFURANTOIN 32 SENSITIVE Sensitive     TRIMETH /SULFA  <=20 SENSITIVE Sensitive     PIP/TAZO Value in next row Resistant      >=128 RESISTANTThis is a modified FDA-approved test that has been validated and its performance characteristics determined by the reporting laboratory.  This laboratory is certified under the Clinical Laboratory Improvement Amendments CLIA as qualified to perform high complexity clinical laboratory testing.    MEROPENEM Value in next row Sensitive      >=128 RESISTANTThis is a modified FDA-approved test that has been validated and its performance characteristics determined by the reporting laboratory.  This laboratory is certified under the Clinical Laboratory Improvement Amendments CLIA as qualified to perform high complexity clinical laboratory testing.    * >=100,000 COLONIES/mL ENTEROBACTER CLOACAE  Blood Culture (routine x 2)     Status: None   Collection Time: 07/12/24 11:41 PM   Specimen: BLOOD  Result Value Ref Range Status   Specimen Description BLOOD RIGHT ANTECUBITAL  Final   Special Requests   Final    BOTTLES DRAWN AEROBIC AND ANAEROBIC Blood Culture results may not be optimal due to an inadequate volume of blood received in culture bottles   Culture   Final    NO GROWTH 5 DAYS Performed at [facility], [address], [place], [place] [address]    Report Status 07/18/2024 FINAL  Final  Urine Culture     Status: None   Collection Time: 07/16/24  1:43 AM   Specimen: Urine, Random  Result Value Ref Range Status   Specimen Description   Final    URINE, RANDOM Performed at [facility], [address], [place], [place] [address]    Special Requests   Final    NONE Reflexed from [id number] Performed at [facility], [address] [place], [place] [address]    Culture   Final    NO GROWTH Performed at [facility], [address], [place], [place] [address]    Report Status 07/17/2024 FINAL  Final   Blood Culture (routine x 2)     Status: None (Preliminary result)   Collection Time: 07/16/24  2:00 AM   Specimen: BLOOD  Result Value Ref Range Status   Specimen Description   Final    BLOOD RIGHT ANTECUBITAL Performed at [facility], [address], [place], [place] [address]    Special Requests   Final    Blood Culture adequate  volume BOTTLES DRAWN AEROBIC AND ANAEROBIC Performed at [facility], [address], [place], [place] [address]    Culture   Final    NO GROWTH 4 DAYS Performed at [facility], [address], [place], [place] [address]    Report Status PENDING  Incomplete  Blood Culture (routine x 2)     Status: None (Preliminary result)   Collection Time: 07/16/24  2:00 AM   Specimen: BLOOD  Result Value Ref Range Status   Specimen Description   Final    BLOOD LEFT ANTECUBITAL Performed at [facility], [address], [place], [place] [address]    Special Requests   Final    Blood Culture adequate volume BOTTLES DRAWN AEROBIC AND ANAEROBIC Performed at [facility], [address] [place], [place] [address]    Culture   Final    NO GROWTH 4 DAYS Performed at [facility], [address], [place], [place] [address]    Report Status PENDING  Incomplete         Radiology Studies: Overnight EEG with video Result Date: 07/20/2024 Shelton Arlin KIDD, MD     07/20/2024 11:17 AM Patient Name: Brenda [name] MRN: [id number] Epilepsy Attending: Arlin KIDD Shelton Referring Physician/Provider: Khaliqdina, Salman, MD Duration: 07/19/2024 9292 to 07/20/2024 9292  Patient history: 63yo F with ams. EEG to evaluate for seizure  Level of alertness: Awake lethargic  AEDs during EEG study: LEV  Technical aspects: This EEG study was done with scalp electrodes positioned according to the 10-20 International system of electrode placement. Electrical activity was reviewed with band pass  filter of 1-70Hz , sensitivity of 7 uV/mm, display speed of 18mm/sec with a 60Hz  notched filter applied as appropriate. EEG data were recorded continuously and digitally stored.  Video monitoring was available and reviewed as appropriate.  Description: EEG showed continuous generalized high amplitude sharply contoured 3 to 6 Hz theta-delta slowing.  Hyperventilation and photic stimulation were not performed.   Patient was noted to have episodes of bilateral upper extremity jerking. Concomitant EEG showed generalized.  periodic discharges with triphasic morphology at 2.5 to 3 Hz and 4-5hz  theta slowing which gradually evolved into 2 to 3 Hz delta slowing admixed with generalized periodic discharges at 1 to 1.5 Hz.  Seizures were noted on 07/19/2024 at 0820, 1305, 1600 and 1922.  Average duration of seizures was 30 minutes to 1.5-hour at times.  Additionally EEG showed generalized periodic discharges with triphasic morphology with fluctuating frequency of 2 to 3 Hz ABNORMALITY - Seizures, generalized - Continuous slow, generalized  IMPRESSION: This study showed seizures with generalized onset during which patient was noted to bilateral upper extremity jerking.  4 seizures were noted on 07/19/2024 at 0820, 1305, 1600 and 1922 lasting between 30 minutes to 1.5 hours.  Additionally EEG showed generalized periodic discharges with triphasic morphology with fluctuating frequency of 2 to 3 Hz.  This EEG pattern is on the ictal-interictal continuum with high suspicion for ictal nature. Lastly there is generalized cerebral dysfunction (encephalopathy).  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 Dr. Matthews was notified. Priyanka O Yadav         Scheduled Meds:  atorvastatin   80 mg Oral Daily   Chlorhexidine  Gluconate Cloth  6 each Topical Daily   enoxaparin   1 mg/kg Subcutaneous BID   insulin  aspart  0-9 Units Subcutaneous Q4H   insulin  glargine-yfgn  8 Units Subcutaneous Daily   levETIRAcetam  500 mg Intravenous Q12H   lidocaine   1 patch Transdermal Daily   sodium chloride  flush  10-40 mL Intracatheter Q12H   Continuous Infusions:  lactated ringers  75 mL/hr at 07/21/24 0210   meropenem (MERREM) IV 1 g (07/21/24 0558)          Derryl Duval, MD [facility] 07/21/2024, 7:57 AM

## 2024-07-21 NOTE — Progress Notes (Signed)
 Neurology progress note  S:  Last seizure 1922 on 11/16 before she got keppra load Remains obtunded but protecting airway  O:  Vitals:   07/21/24 0814 07/21/24 1124  BP: (!) 170/64 (!) 166/77  Pulse: (!) 108 (!) 110  Resp: 17   Temp: 99.7 F (37.6 C) 100.3 F (37.9 C)  SpO2: 100% 100%   Gen: lying in bed eyes closed NAD Resp: normal WOB CV: extremities appear well-perfused  Neurologic exam  MS: arousable to mild physical stim but does not voluntarily open eyes or respond to commands Speech: no intelligible speech Cranial nerves:   CN II L pupil dilated and unreactive to light, R pupil is 3mm and sluggish reaction to light. Appears to blink to threat BL but inconsistently.   CN III,IV,VI EOM intact to dolls eyes, no gaze preference or deviation, no nystagmus   CN V Corneals intact BL   CN VII ?mild R facial droop, improved with facial grimace.   CN VIII Does not make eye contact to speech   CN IX & X Gag intact, appears to be protecting her airway at this time.   CN XI Head is midline   CN XII Does not protrude tongue on command.    Sensory/Motor:  Withdraws all extremities to proximal pinch.   Data  LTM EEG 11/16-11/17:  ABNORMALITY - Seizures, generalized - Continuous slow, generalized   IMPRESSION: This study showed seizures with generalized onset during which patient was noted to bilateral upper extremity jerking.  4 seizures were noted on 07/19/2024 at 0820, 1305, 1600 and 1922 lasting between 30 minutes to 1.5 hours.     Additionally EEG showed generalized periodic discharges with triphasic morphology with fluctuating frequency of 2 to 3 Hz.  This EEG pattern is on the ictal-interictal continuum with high suspicion for ictal nature.   Lastly there is generalized cerebral dysfunction (encephalopathy).  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   CT Head without contrast(Personally  reviewed): 1. Chronic encephalomalacia changes in the right medial temporal and occipital lobes developed in the interim. 2. Unchanged small calcified meningioma along left frontal convexity.   MRI Brain(Personally reviewed): 1. Similar evolving infarcts in the anterior and posterior circulation. 2. No new infarcts, progressive mass effect or acute hemorrhage.  A/P: EEG abnormalities favored to be 2/2 cefepime  toxicity. Clinical and electrographic seizures also favored to be driven primarily by cefepime  toxicity however given her prior (recent) stroke, known meningioma, and recurrent UTIs her risk of recurrent seizure is high enough to justify continuation of AED at hospital discharge (and indefinitely). Of note, husband is concerned that she actually had a seizure prior to admission bc a similar shaking episode is what caused him to call 911. Today I met with daughter and palliative care for long discussion about etiology treatment and prognosis of her seizures. I answered her questions to the best of my ability and told her I will be on all week if she thinks of anything else or would like to discuss further.  - S/p keppra load 11/17, continue 500mg  bid - Continue EEG until seizure free with no concerning ictal-interictal patterns x24 hrs - Cefepime  discontinued 11/16 AM, now on meropenem for UTI - Appreciate palliative care assistance  Will continue to follow  [name], MD [facility] [phone]  If 7pm- 7am, please page neurology on call as listed in AMION.

## 2024-07-21 NOTE — Consult Note (Signed)
 Palliative Care Consult Note                                  Date: 07/21/2024   Patient Name: Brenda [name]  DOB:1961/05/06  FMW:[id number]  Age / Sex:63 y.o., female  PCP: [facility] Referring Physician: Mcarthur Pick, MD  Reason for Consultation: Establishing goals of care  Past Medical History:  Diagnosis Date   Anemia    Arthritis    back, hands, hips (02/22/2015)   CAD (coronary artery disease)    a. complex LAD/diagonal bifurcation PCI in 2010. b. STEMI 10/2019 s/p PTCA/DES x1 to mLAD overlapping the old stent, residual disease treated medicaly.   Cancer [facility])    uterine   CHF (congestive heart failure) (HCC)    Depression    Diabetes mellitus (HCC)    started when I was pregnant; not sure if it was type 1 or type 2    History of radiation therapy    Endometrium- HDR 01/22/22-03/07/22- Dr. Lynwood Nasuti   Hypercholesterolemia    Hypertension    Morbid obesity (HCC)    Myocardial infarction (HCC)    mild x 3   Neuromuscular disorder (HCC)    neuropathy feet   Sleep apnea    cpap/     Assessment & Plan:   HPI/Patient Profile: 63 y.o. 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  Palliative medicine consulted for goals of care conversation.   SUMMARY OF RECOMMENDATIONS   DNR-limited Open to all aggressive medical interventions - including artificial tube feeds, but only temporarily, would not want life to be prolonged indefinitely if chance of meaningful recovery is low Continue to monitor going forward for deterioration and goals of care discussion  Symptom Management:  Per primary team  Code Status: DNR - Limited (DNR/DNI)  Prognosis:  Unable to determine  Discharge Planning:  To Be Determined    Discussed with: Sigdel MD on 07/21/2024 about family's desire to continue current medical interventions with a time limited trial of feeding tube and artificial feeds and continued discussions about goals of care if patient does not improve.   Subjective:   Reviewed medical records, received report from team, assessed the patient and then meet at the patient's bedside to discuss diagnosis, prognosis, GOC, EOL wishes disposition and options.  Before meeting with the patient/family, I spent time reviewing the chart notes including prior palliative consult note on 05/2024 by Dr. Jeryl and oncology note on 07/14/2024 by Dr. Lonn. Reviewed CT Head on 05/31/2024, MRI on 05/31/2024, 06/20/2024, 07/18/2024.   Prior palliative noted that patient was supposed to have an outpatient visit with palliative care NP for further goals of care conversation.   Oncology note on 07/14/2024 by Dr. Lonn as well as conversation with family that the patient stated that she remains motivated to continue to seek aggressive interventions for the metastatic cancer.    CT and MRI head showed signs consistent with infarcts resulting in significant vision impairment and some aphasia when which were reviewed with family.   I met with patient, daughters, and husband 3 separate visits during initial consult. Dr. Matthews from Neurology was present during one of the meetings with the patient's daughter to discuss neurological status. Patient remained somnolent during all three visits and obtunded.  We meet to discuss diagnosis prognosis, GOC, EOL wishes, disposition and options. Concept of Palliative Care was introduced as specialized medical care for people and their families living with serious illness.  If focuses on providing relief from the symptoms and stress of a serious illness.  The goal is to improve quality of life for both the patient and the family. Values and goals of care important to patient and family were  attempted to be elicited.  Created space and opportunity for patient  and family to explore thoughts and feelings regarding current medical situation   Natural trajectory and current clinical status were discussed. Questions and concerns addressed. Patient encouraged to call with questions or concerns.    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 Matthews (neurology) and daughter Lelan) to review reasons for seizures, current management of seizures, and expectations regarding her seizures - Family initially questioned the medical team that her out of hospital seizures on 11/13 and current in hospital seizures on 11/16 with EEG were due to cefepime  neurotoxicity, but after further discussion with neurology and family that the seizures were most likely due to three factors (prior stroke, UTI lowering seizure threshold, and cefepime ) - Family were reassured that the current seizures exhibited during her  hospitalization and on EEG were most likely due to cefepime  due to the characteristics observed - Discussed with family about what the patient would express to us  going forward if she were able to speak with us  and family shared that she would not want her life to be prolonged if she were to be dependent on life support indefinitely - Prior to the patient losing her consciousness on 11/14 - daughter Lelan) shared that her mom was adamant about not being placed on the ventilator, but also that she wanted to continue other aggressive measures to prolong her life - Husband expressed desire of wanting artificial feeding tube temporarily to assist in the patient recovering from the UTI and seizures, but knows that the patient would not want a permanent feeding tube  Review of Systems  Unable to perform ROS  Objective:   Primary Diagnoses: Present on Admission:  UTI (urinary tract infection)  Chronic hypoxic respiratory failure (HCC)  Anxiety with depression  CAD (coronary artery disease)  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     Comments: Obtunded  HENT:     Head: Normocephalic.     Nose: Nose normal.  Pulmonary:     Effort: Pulmonary effort is normal. No respiratory distress.  Abdominal:     Palpations: Abdomen is soft.  Musculoskeletal:     Right lower leg: No edema.     Left lower leg: No edema.  Skin:    General: Skin is warm.    Palliative Assessment/Data: 20%    Thank you for allowing us  to participate in the care of Tal Neer PMT will continue to support holistically.  I  personally spent a total of 150 minutes in the care of the patient today including preparing to see the patient, getting/reviewing separately obtained history, performing a medically appropriate exam/evaluation, counseling and educating, referring and communicating with other health care professionals, documenting clinical information in the EHR, and goals of care conversation.  Signed by: [name] Palliative Medicine Team  Team Phone # [phone] (Nights/Weekends)  07/21/2024, 5:04 PM

## 2024-07-21 NOTE — Progress Notes (Incomplete)
 NEUROLOGY CONSULT FOLLOW UP NOTE   Date of service: July 21, 2024 Patient Name: Brenda [name] MRN:  [id number] DOB:  [birth year]  Interval Hx/subjective   Patient remains on LTM with no seizures identified No new neurological events overnight. Patient recently received fentanyl   Vitals   Vitals:   07/21/24 0404 07/21/24 0500 07/21/24 0814 07/21/24 1124  BP: (!) 174/73  (!) 170/64 (!) 166/77  Pulse: (!) 108  (!) 108 (!) 110  Resp: 20  17   Temp: 100 F (37.8 C)  99.7 F (37.6 C) 100.3 F (37.9 C)  TempSrc: Oral  Oral Oral  SpO2: 100%  100% 100%  Weight:  80.2 kg    Height:         Body mass index is 32.34 kg/m.  Physical Exam   Constitutional: Chronically ill female Eyes: No scleral injection.   HENT: No OP obstrucion.   Head: Normocephalic.   Cardiovascular: Normal rate and regular rhythm.   Respiratory: Effort normal, non-labored breathing.   GI: Soft.  No distension. There is no tenderness.   Skin: WDI.    Neurologic Examination   Mental Status -  Patient is drowsy but will moan and move her head to stimulation but does not open her eyes spontaneously or follow commands.  Very little speech output  Cranial Nerves II - XII - II - Visual field no blink to threat bilaterally.  husband says patient is almost blind with very poor vision III, IV, VI -doll's eyes intact, eyes are midline intact corneal reflex bilaterally V - Facial sensation intact bilaterally. VII - Facial movement intact bilaterally. VIII - Hearing & vestibular intact bilaterally. X - Palate elevates symmetrically. XI - Chin turning & shoulder shrug intact bilaterally. XII -unable to assess  Motor Strength -no spontaneous movement seen, withdraws in all 4 extremities to painful stimuli bulk was normal and fasciculations were absent.   Motor Tone - Muscle tone was assessed at the neck and appendages and was normal. Sensory -response to noxious stimuli Coordination -unable to  assess  Gait and Station - deferred.  Medications  Current Facility-Administered Medications:    acetaminophen  (TYLENOL ) tablet 650 mg, 650 mg, Oral, Q6H PRN **OR** acetaminophen  (TYLENOL ) suppository 650 mg, 650 mg, Rectal, Q6H PRN, Foust, Katy L, NP, 650 mg at 07/21/24 0008   atorvastatin  (LIPITOR ) tablet 80 mg, 80 mg, Oral, Daily, Laurence, Lydia D, RPH   Chlorhexidine  Gluconate Cloth 2 % PADS 6 each, 6 each, Topical, Daily, Chavez, Abigail, NP, 6 each at 07/21/24 1101   enoxaparin  (LOVENOX ) injection 80 mg, 1 mg/kg, Subcutaneous, BID, Foust, Katy L, NP, 80 mg at 07/21/24 1031   fentaNYL  (SUBLIMAZE ) injection 25-50 mcg, 25-50 mcg, Intravenous, Q2H PRN, Opyd, Timothy S, MD, 50 mcg at 07/21/24 1210   haloperidol lactate (HALDOL) injection 2 mg, 2 mg, Intravenous, Q6H PRN, Cheryle, Kshitiz, MD, 2 mg at 07/19/24 0142   insulin  aspart (novoLOG ) injection 0-9 Units, 0-9 Units, Subcutaneous, Q4H, Sigdel, Santosh, MD, 3 Units at 07/21/24 1147   [START ON 07/22/2024] insulin  glargine-yfgn (SEMGLEE ) injection 20 Units, 20 Units, Subcutaneous, Daily, Sigdel, Santosh, MD   labetalol  (NORMODYNE ) injection 10 mg, 10 mg, Intravenous, Q2H PRN, Sigdel, Santosh, MD, 10 mg at 07/20/24 1710   [COMPLETED] levETIRAcetam (KEPPRA) undiluted injection 1,000 mg, 1,000 mg, Intravenous, Once, 1,000 mg at 07/20/24 1550 **FOLLOWED BY** levETIRAcetam (KEPPRA) undiluted injection 500 mg, 500 mg, Intravenous, Q12H, Matthews Elida HERO, MD, 500 mg at 07/21/24 0122   lidocaine  (LIDODERM ) 5 % 1  patch, 1 patch, Transdermal, Daily, Alekh, Kshitiz, MD, 1 patch at 07/21/24 1030   meropenem (MERREM) 1 g in sodium chloride  0.9 % 100 mL IVPB, 1 g, Intravenous, Q8H, Alekh, Kshitiz, MD, Last Rate: 200 mL/hr at 07/21/24 0558, 1 g at 07/21/24 0558   ondansetron  (ZOFRAN ) tablet 4 mg, 4 mg, Oral, Q6H PRN **OR** ondansetron  (ZOFRAN ) injection 4 mg, 4 mg, Intravenous, Q6H PRN, Foust, Katy L, NP   oxyCODONE  (Oxy IR/ROXICODONE ) immediate release tablet 5  mg, 5 mg, Oral, Q6H PRN, Cheryle, Kshitiz, MD   prochlorperazine  (COMPAZINE ) tablet 10 mg, 10 mg, Oral, Q6H PRN, Foust, Katy L, NP   senna-docusate (Senokot-S) tablet 1 tablet, 1 tablet, Oral, QHS PRN, Foust, Katy L, NP   sodium chloride  flush (NS) 0.9 % injection 10-40 mL, 10-40 mL, Intracatheter, Q12H, Sigdel, Santosh, MD, 10 mL at 07/20/24 2218   sodium chloride  flush (NS) 0.9 % injection 10-40 mL, 10-40 mL, Intracatheter, PRN, Mcarthur Pick, MD  Labs and Diagnostic Imaging   CBC:  Recent Labs  Lab 07/20/24 1120 07/21/24 1107  WBC 11.8* 9.4  NEUTROABS 9.6* 7.1  HGB 12.2 11.6*  HCT 37.5 36.5  MCV 83.0 83.9  PLT 280 257    Basic Metabolic Panel:  Lab Results  Component Value Date   NA 140 07/20/2024   K 4.3 07/20/2024   CO2 16 (L) 07/20/2024   GLUCOSE 288 (H) 07/20/2024   BUN 8 07/20/2024   CREATININE 0.90 07/20/2024   CALCIUM  9.6 07/20/2024   GFRNONAA >60 07/20/2024   GFRAA 94 12/16/2019   Lipid Panel:  Lab Results  Component Value Date   LDLCALC 28 05/30/2024   HgbA1c:  Lab Results  Component Value Date   HGBA1C 7.2 (H) 06/01/2024   Urine Drug Screen:     Component Value Date/Time   LABOPIA POSITIVE (A) 04/20/2024 1817   COCAINSCRNUR NONE DETECTED 04/20/2024 1817   LABBENZ NONE DETECTED 04/20/2024 1817   AMPHETMU NONE DETECTED 04/20/2024 1817   THCU NONE DETECTED 04/20/2024 1817   LABBARB NONE DETECTED 04/20/2024 1817    Alcohol Level     Component Value Date/Time   ETH <15 04/20/2024 1344   INR  Lab Results  Component Value Date   INR 1.0 07/16/2024   APTT  Lab Results  Component Value Date   APTT 67 (H) 06/21/2024   AED levels: No results found for: PHENYTOIN, ZONISAMIDE, LAMOTRIGINE, LEVETIRACETA  CT Head without contrast 1. Chronic encephalomalacia changes in the right medial temporal and occipital lobes developed in the interim. 2. Unchanged small calcified meningioma along left frontal convexity.   MRI Brain 1. Similar  evolving infarcts in the anterior and posterior circulation. 2. No new infarcts, progressive mass effect or acute hemorrhage.   LTM EEG 11/16-11/17:   ABNORMALITY - Seizures, generalized - Continuous slow, generalized   IMPRESSION: This study showed seizures with generalized onset during which patient was noted to bilateral upper extremity jerking.  4 seizures were noted on 07/19/2024 at 0820, 1305, 1600 and 1922 lasting between 30 minutes to 1.5 hours.     Additionally EEG showed generalized periodic discharges with triphasic morphology with fluctuating frequency of 2 to 3 Hz.  This EEG pattern is on the ictal-interictal continuum with high suspicion for ictal nature.   Lastly there is generalized cerebral dysfunction (encephalopathy).  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   LTM EEG 11/17-11/18: ABNORMALITY - Periodic discharges with triphasic morphology, generalized -  Continuous slow, generalized   IMPRESSION: This study showed generalized periodic discharges with triphasic morphology with fluctuating frequency of 2 to 2.5 Hz. This EEG pattern is on the ictal-interictal continuum with high suspicion for ictal nature.   Additionally there is generalized cerebral dysfunction (encephalopathy).     LTM EEG 11/18-11/19: ABNORMALITY - Periodic discharges with triphasic morphology, generalized - Continuous slow, generalized   IMPRESSION: This study showed generalized periodic discharges ( GPDs) with triphasic morphology. At the beginning of the study, the frequency of GPDs was 2-2.5Hz  which gradually improved to 1-2hz . This EEG pattern is on the ictal-interictal continuum.    Additionally there is generalized cerebral dysfunction (encephalopathy).      EEG appears to be improving compared to previous day.  Assessment   Tenleigh Byer is a 63 y.o. 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 She subsequently continued to decline, becoming increasingly encephalopathic. She was subsequently transferred to [facility] for cEEG.  Initial LTM EEG recording showed 4 electrographic seizures on 07/19/24 with generalized onset lasting 30-90 min and associated with BUE jerking. Additionally EEG showed generalized periodic discharges with triphasic morphology with fluctuating frequency of 2 to 3 Hz.  This EEG pattern is on the ictal-interictal continuum with high suspicion for ictal nature.  Clinical and electrographic seizures were favored to be driven primarily by cefepime  toxicity however given her prior (recent) stroke, known meningioma, and recurrent UTIs her risk of recurrent seizure is high enough to justify continuation of AED at hospital discharge (and indefinitely). Electrographic seizures captured 11/16 cannot be attributed to injury from prior strokes or cortical irritation from known meningioma because onset was generalized, not focal. Of note, husband is concerned that she actually had a seizure prior to admission bc a similar shaking episode is what caused him to call 911.   She hs been seizure-free on keppra and off cefepime  now for 72 hours. Her EEG background has improved but not her mental status. Given profound continued encephalopathy (not only 2/2 prn fentanyl ) I think it would be prudent to r/o concurrent meningitis. She has relative immunosuppression given recent chemo.  Attending Dr. Matthews subsequently had an extensive conversation with husband at bedside about these concerns including diagnostic evaluation and treatment plan. He is in agreement with recommendations below.  Recommendations   - Continue keppra 500mg  bid - OK to d/c LTM EEG in AM if no seizures overnight and background stable or improved - Hold therapeutic lovenox   and transition to heparin  gtt in anticipation of LP Friday. Stop heparin  gtt at midnight on 11/20 (Thurs night). Pharmacy consult placed. - Pharmacy consult to broaden antibiotic regimen to cover empiric therapy for bacterial meningitis - Per family request, will order IR LP for Friday AM without prior bedside attempt. Patient has stage IV metastatic endometrial cancer with severe associated chronic pain and they want to minimize further discomfort in any way possible. Palliative care is also following. - CSF labs: ME panel, protein and glucose, cell count x2 tubes, culture and gram stain. No indication for cytology - leptomeningeal carcinomatosis is extremely rare in the setting of endometrial carcinoma.  - Will continue to follow  Karna Geralds NP   Attending Neurohospitalist Addendum Patient seen and examined with APP/Resident. Agree with the history and physical as documented above. Agree with the plan as documented, which  I helped formulate. I have edited the note above to reflect my full findings and recommendations. I have independently reviewed the chart, obtained history, review of systems and examined the patient.I have personally reviewed pertinent head/neck/spine imaging (CT/MRI). Please feel free to call with any questions.  -- [name], MD [facility] [phone]  If 7pm- 7am, please page neurology on call as listed in AMION.

## 2024-07-21 NOTE — Procedures (Signed)
 Patient Name: Brenda [name]  MRN: [id number]  Epilepsy Attending: Arlin MALVA Krebs  Referring Physician/Provider: Khaliqdina, Salman, MD  Duration: 07/20/2024 9292 to 07/21/2024 9292   Patient history: 63yo F with ams. EEG to evaluate for seizure   Level of alertness: Awake/ lethargic   AEDs during EEG study: LEV   Technical aspects: This EEG study was done with scalp electrodes positioned according to the 10-20 International system of electrode placement. Electrical activity was reviewed with band pass filter of 1-70Hz , sensitivity of 7 uV/mm, display speed of 22mm/sec with a 60Hz  notched filter applied as appropriate. EEG data were recorded continuously and digitally stored.  Video monitoring was available and reviewed as appropriate.   Description: EEG showed continuous generalized high amplitude sharply contoured 3 to 6 Hz theta-delta slowing.  Intermittently, EEG also showed generalized periodic discharges with triphasic morphology and fluctuating frequency of 2 to 2.5 Hz Hyperventilation and photic stimulation were not performed.       ABNORMALITY - Periodic discharges with triphasic morphology, generalized - Continuous slow, generalized   IMPRESSION: This study showed generalized periodic discharges with triphasic morphology with fluctuating frequency of 2 to 2.5 Hz. This EEG pattern is on the ictal-interictal continuum with high suspicion for ictal nature.   Additionally there is generalized cerebral dysfunction (encephalopathy).      Brenda [name] O Tonnie Friedel

## 2024-07-21 NOTE — Progress Notes (Signed)
 OT Cancellation Note  Patient Details Name: Deaundra Dupriest MRN: [id number] DOB: 1961/06/13   Cancelled Treatment:    Reason Eval/Treat Not Completed: Other (comment) (RN reports fentanyl  recently given for pain management and hopeful for GOC discussion this afternoon. Will check back tomorrow.)  Antonetta Clanton D Walton, OTD, OTR/L [facility] Acute Rehabilitation Office: [phone]   Elma JONETTA Penner 07/21/2024, 2:04 PM

## 2024-07-21 NOTE — Progress Notes (Signed)
 LTM maint complete - no skin breakdown under:  T8,C4,P4

## 2024-07-21 NOTE — Progress Notes (Signed)
 OT Cancellation Note  Patient Details Name: Vaidehi Braddy MRN: [id number] DOB: 07/08/1961   Cancelled Treatment:    Reason Eval/Treat Not Completed: Other (comment). Pt sleeping with daughter preset in room. Daughter provides history, however, reporting this is the first time this morning pt has not been tremulous and hoping pt can get some rest for a bit. Will return for formal eval as schedule allows.   Elma JONETTA Lebron FREDERICK, OTR/L [facility] Acute [facility] Office: [phone]   Elma JONETTA Lebron 07/21/2024, 8:43 AM

## 2024-07-21 NOTE — Progress Notes (Signed)
 [facility] CSW Progress Note  Visual Merchandiser contacted caregiver by phone (husband- [name]) to follow-up per message from RN. Patient is currently admitted at [facility].  [name] husband expressed extreme worry for Brenda [name] and frustration that the family feels care is disjointed. He asked about ways to help advocate and get all of the care providers on the same page.  CSW provided empathic & supportive listening. Discussed trying to see palliative care while inpt to discuss goals of and coordination of care.   CSW spoke with [facility] palliative provider who was able to confirm that an order had been placed already for inpt palliative and that they should be meeting with the pt/family today.       Follow Up Plan:  CSW encouraged pt's spouse to call with any other questions or support needs. CSW will follow with pt in outpatient setting    Brenda Liberatore E Aleem Elza, LCSW Clinical Social Worker [facility]    Patient is participating in a Managed Medicaid Plan:  Yes

## 2024-07-22 ENCOUNTER — Inpatient Hospital Stay (HOSPITAL_COMMUNITY)

## 2024-07-22 DIAGNOSIS — Z515 Encounter for palliative care: Secondary | ICD-10-CM | POA: Diagnosis not present

## 2024-07-22 DIAGNOSIS — Z66 Do not resuscitate: Secondary | ICD-10-CM | POA: Diagnosis not present

## 2024-07-22 DIAGNOSIS — G9341 Metabolic encephalopathy: Secondary | ICD-10-CM | POA: Diagnosis not present

## 2024-07-22 DIAGNOSIS — E870 Hyperosmolality and hypernatremia: Secondary | ICD-10-CM | POA: Diagnosis not present

## 2024-07-22 DIAGNOSIS — G9340 Encephalopathy, unspecified: Secondary | ICD-10-CM | POA: Diagnosis not present

## 2024-07-22 DIAGNOSIS — G893 Neoplasm related pain (acute) (chronic): Secondary | ICD-10-CM | POA: Diagnosis not present

## 2024-07-22 DIAGNOSIS — R569 Unspecified convulsions: Secondary | ICD-10-CM

## 2024-07-22 DIAGNOSIS — T361X4A Poisoning by cephalosporins and other beta-lactam antibiotics, undetermined, initial encounter: Secondary | ICD-10-CM | POA: Diagnosis not present

## 2024-07-22 DIAGNOSIS — N390 Urinary tract infection, site not specified: Secondary | ICD-10-CM | POA: Diagnosis not present

## 2024-07-22 DIAGNOSIS — R4182 Altered mental status, unspecified: Secondary | ICD-10-CM | POA: Diagnosis not present

## 2024-07-22 LAB — COMPREHENSIVE METABOLIC PANEL WITH GFR
ALT: 18 U/L (ref 0–44)
AST: 19 U/L (ref 15–41)
Albumin: 2.8 g/dL — ABNORMAL LOW (ref 3.5–5.0)
Alkaline Phosphatase: 57 U/L (ref 38–126)
Anion gap: 11 (ref 5–15)
BUN: 15 mg/dL (ref 8–23)
CO2: 26 mmol/L (ref 22–32)
Calcium: 9.4 mg/dL (ref 8.9–10.3)
Chloride: 113 mmol/L — ABNORMAL HIGH (ref 98–111)
Creatinine, Ser: 0.77 mg/dL (ref 0.44–1.00)
GFR, Estimated: >60 mL/min
Glucose, Bld: 167 mg/dL — ABNORMAL HIGH (ref 70–99)
Potassium: 3.6 mmol/L (ref 3.5–5.1)
Sodium: 150 mmol/L — ABNORMAL HIGH (ref 135–145)
Total Bilirubin: 0.2 mg/dL (ref 0.0–1.2)
Total Protein: 6.5 g/dL (ref 6.5–8.1)

## 2024-07-22 LAB — CBC WITH DIFFERENTIAL/PLATELET
Abs Immature Granulocytes: 0.04 K/uL (ref 0.00–0.07)
Basophils Absolute: 0.1 K/uL (ref 0.0–0.1)
Basophils Relative: 1 %
Eosinophils Absolute: 0 K/uL (ref 0.0–0.5)
Eosinophils Relative: 0 %
HCT: 33.9 % — ABNORMAL LOW (ref 36.0–46.0)
Hemoglobin: 10.7 g/dL — ABNORMAL LOW (ref 12.0–15.0)
Immature Granulocytes: 1 %
Lymphocytes Relative: 23 %
Lymphs Abs: 1.6 K/uL (ref 0.7–4.0)
MCH: 27 pg (ref 26.0–34.0)
MCHC: 31.6 g/dL (ref 30.0–36.0)
MCV: 85.4 fL (ref 80.0–100.0)
Monocytes Absolute: 0.7 K/uL (ref 0.1–1.0)
Monocytes Relative: 9 %
Neutro Abs: 4.7 K/uL (ref 1.7–7.7)
Neutrophils Relative %: 66 %
Platelets: 209 K/uL (ref 150–400)
RBC: 3.97 MIL/uL (ref 3.87–5.11)
RDW: 17.1 % — ABNORMAL HIGH (ref 11.5–15.5)
WBC: 7 K/uL (ref 4.0–10.5)
nRBC: 0 % (ref 0.0–0.2)

## 2024-07-22 LAB — GLUCOSE, CAPILLARY
Glucose-Capillary: 150 mg/dL — ABNORMAL HIGH (ref 70–99)
Glucose-Capillary: 153 mg/dL — ABNORMAL HIGH (ref 70–99)
Glucose-Capillary: 158 mg/dL — ABNORMAL HIGH (ref 70–99)
Glucose-Capillary: 190 mg/dL — ABNORMAL HIGH (ref 70–99)
Glucose-Capillary: 219 mg/dL — ABNORMAL HIGH (ref 70–99)
Glucose-Capillary: 277 mg/dL — ABNORMAL HIGH (ref 70–99)

## 2024-07-22 LAB — TSH: TSH: 0.315 u[IU]/mL — ABNORMAL LOW (ref 0.350–4.500)

## 2024-07-22 MED ORDER — SODIUM CHLORIDE 0.9 % IV SOLN
2.0000 g | Freq: Three times a day (TID) | INTRAVENOUS | Status: DC
  Administered 2024-07-22 – 2024-07-24 (×6): 2 g via INTRAVENOUS
  Filled 2024-07-22 (×8): qty 40

## 2024-07-22 MED ORDER — PROSOURCE TF20 ENFIT COMPATIBL EN LIQD
60.0000 mL | Freq: Every day | ENTERAL | Status: DC
  Administered 2024-07-22 – 2024-08-18 (×28): 60 mL
  Filled 2024-07-22 (×28): qty 60

## 2024-07-22 MED ORDER — PROPRANOLOL HCL 10 MG PO TABS
20.0000 mg | ORAL_TABLET | Freq: Three times a day (TID) | ORAL | Status: DC
  Administered 2024-07-22 – 2024-07-23 (×5): 20 mg via ORAL
  Filled 2024-07-22 (×5): qty 2

## 2024-07-22 MED ORDER — FREE WATER
200.0000 mL | Freq: Four times a day (QID) | Status: DC
  Administered 2024-07-22 – 2024-07-25 (×13): 200 mL

## 2024-07-22 MED ORDER — LEVETIRACETAM 500 MG PO TABS
500.0000 mg | ORAL_TABLET | Freq: Two times a day (BID) | ORAL | Status: DC
  Administered 2024-07-22 – 2024-08-18 (×54): 500 mg
  Filled 2024-07-22 (×10): qty 1
  Filled 2024-07-22: qty 2
  Filled 2024-07-22 (×43): qty 1

## 2024-07-22 MED ORDER — SODIUM CHLORIDE 0.9 % IV SOLN
2.0000 g | Freq: Four times a day (QID) | INTRAVENOUS | Status: DC
  Administered 2024-07-22 – 2024-07-24 (×9): 2 g via INTRAVENOUS
  Filled 2024-07-22 (×11): qty 2000

## 2024-07-22 MED ORDER — OSMOLITE 1.5 CAL PO LIQD
1000.0000 mL | ORAL | Status: DC
  Administered 2024-07-22 – 2024-08-03 (×11): 1000 mL
  Filled 2024-07-22 (×3): qty 1000

## 2024-07-22 MED ORDER — HEPARIN (PORCINE) 25000 UT/250ML-% IV SOLN
1200.0000 [IU]/h | INTRAVENOUS | Status: DC
  Administered 2024-07-22: 1200 [IU]/h via INTRAVENOUS
  Filled 2024-07-22: qty 250

## 2024-07-22 MED ORDER — THIAMINE MONONITRATE 100 MG PO TABS
100.0000 mg | ORAL_TABLET | Freq: Every day | ORAL | Status: AC
  Administered 2024-07-22 – 2024-07-26 (×5): 100 mg
  Filled 2024-07-22 (×5): qty 1

## 2024-07-22 MED ORDER — VANCOMYCIN HCL IN DEXTROSE 1-5 GM/200ML-% IV SOLN
1000.0000 mg | Freq: Two times a day (BID) | INTRAVENOUS | Status: DC
  Administered 2024-07-23 – 2024-07-24 (×3): 1000 mg via INTRAVENOUS
  Filled 2024-07-22 (×5): qty 200

## 2024-07-22 MED ORDER — VANCOMYCIN HCL 1500 MG/300ML IV SOLN
1500.0000 mg | Freq: Once | INTRAVENOUS | Status: AC
  Administered 2024-07-22: 1500 mg via INTRAVENOUS
  Filled 2024-07-22: qty 300

## 2024-07-22 NOTE — Plan of Care (Signed)
   Problem: Education: Goal: Ability to describe self-care measures that may prevent or decrease complications (Diabetes Survival Skills Education) will improve Outcome: Progressing Goal: Individualized Educational Video(s) Outcome: Progressing   Problem: Coping: Goal: Ability to adjust to condition or change in health will improve Outcome: Progressing

## 2024-07-22 NOTE — Plan of Care (Signed)
   Problem: Coping: Goal: Ability to adjust to condition or change in health will improve Outcome: Progressing   Problem: Fluid Volume: Goal: Ability to maintain a balanced intake and output will improve Outcome: Progressing   Problem: Health Behavior/Discharge Planning: Goal: Ability to identify and utilize available resources and services will improve Outcome: Progressing

## 2024-07-22 NOTE — Procedures (Signed)
 Cortrak  Person Inserting Tube:  Brenda [name], [name], RD Tube Type:  Cortrak - 43 inches Tube Size:  10 Tube Location:  Left nare Secured by: Bridle Technique Used to Measure Tube Placement:  Marking at nare/corner of mouth Cortrak Secured At:  63 cm Initial Placement Verification:  Xray  Cortrak Tube Team Note:  Consult received to place a Cortrak feeding tube.   X-ray is required. RN may begin using tube after tube placement confirmed.   If the tube becomes dislodged please keep the tube and contact the Cortrak team at [email or web address] for replacement.  If after hours and replacement cannot be delayed, place a NG tube and confirm placement with an abdominal x-ray.    [name], RD Registered Dietitian  See Amion for more information

## 2024-07-22 NOTE — Progress Notes (Signed)
 Initial Nutrition Assessment  DOCUMENTATION CODES:  Non-severe (moderate) malnutrition in context of chronic illness  INTERVENTION:  Initiate tube feeding via cortrak: Osmolite 1.5 at 45 ml/h (1080 ml per day) Start at 25 and advance by 10mL every 12 hours to reach goal Prosource TF20 60 ml 1x/d Free water : 200mL every 6 hours per MD Provides 1700 kcal, 88 gm protein, 822 ml free water  daily ( fee water  TF+flush) Pt is at risk for refeeding syndrome given prolonged poor PO intake. Monitor magnesium  and phosphorus daily x 3 days, MD to replete as needed. 100mg  thiamine  x 5 days Monitor mentation for ability to take in a PO diet Messaged MD and DM coordinators to proactively discuss CBGs as daughter mentioned the high levels this admission while being NPO Monitor GOC discussions   NUTRITION DIAGNOSIS:  Moderate Malnutrition related to chronic illness (cancer) as evidenced by percent weight loss, mild muscle depletion, moderate muscle depletion (12% x 6 months).  GOAL:  Patient will meet greater than or equal to 90% of their needs  MONITOR:  Weight trends, TF tolerance, I & O's, Labs, Diet advancement  REASON FOR ASSESSMENT:  Consult Enteral/tube feeding initiation and management  ASSESSMENT:  Pt with hx of HTN, HLD, CAD, CHF, hx CVA, uterine cancer s/p hysterectomy and adjuvant radiation therapy in 2023 with recurrance with mets in July 2025, hx MI x 3, and DM type 2 presented to [facility] ED with weakness after a recent admission from 11/9-11/11. Pt had seizures during admission and was transferred to [facility] for continuous EEG.  11/13 - presented to [facility] ED with chills and weakness after recent admission to [facility] 11/15 - [facility] - cortrak placed (gastric)  Pt resting in bed at the time of assessment, does not interact or respond during assessment. Pt with two of her good friends at bedside who were able to call daughter on the  phone. Discussed recent nutrition hx and plan now that tube is in place.   Daughter reports that pt had previously been on a GLP1 and had lost some weight but that it was discontinued when she had recurrence of her cancer which was discovered in July. Has been actively undergoing chemotherapy. Daughter reports that pt was placed on an appetite stimulant and that she had still been eating 2 meals and 2 snacks a day but that for several weeks appetite has been diminished. They have also noticed steady weight loss despite not being on the GLP1 anymore. Pt has gotten weaker and note that she has recently been less mobile.   Daughter does report that pt's last PO intake was a little applesauce on Sunday morning. Will start enteral nutrition slowly to monitor for refeeding syndrome.   Admit weight: 77.9 kg Current weight: 80.1 kg   12% weight loss noted in the last 6 months (since cancer recurrence) which is severe for timeframe  Nutritionally Relevant Medications: Scheduled Meds:  atorvastatin   80 mg Oral Daily   PROSource TF20  60 mL Per Tube Daily   free water   200 mL Per Tube Q6H   insulin  aspart  0-15 Units Subcutaneous Q4H   insulin  glargine-yfgn  20 Units Subcutaneous Daily   thiamine   100 mg Per Tube Daily   Continuous Infusions:  feeding supplement (OSMOLITE 1.5 CAL)     meropenem  (MERREM ) IV 200 mL/hr at 07/22/24 0502   PRN Meds: ondansetron , prochlorperazine , senna-docusate  Labs Reviewed: Sodium 150, chloride 113 Magnesium  1.4 CBG ranges  from 150-277 mg/dL over the last 24 hours HgbA1c 7.2% (06/01/24)  NUTRITION - FOCUSED PHYSICAL EXAM: Flowsheet Row Most Recent Value  Orbital Region No depletion  Upper Arm Region No depletion  Thoracic and Lumbar Region No depletion  Buccal Region No depletion  Temple Region Mild depletion  Clavicle Bone Region Mild depletion  Clavicle and Acromion Bone Region Mild depletion  Scapular Bone Region Mild depletion  Dorsal Hand No depletion   Patellar Region Moderate depletion  Anterior Thigh Region Moderate depletion  Posterior Calf Region Moderate depletion  Edema (RD Assessment) Moderate  [BLE, BUE]  Hair Reviewed  Eyes Reviewed  Mouth Reviewed  Skin Reviewed  Nails Reviewed    Diet Order:   Diet Order             Diet NPO time specified  Diet effective now                   EDUCATION NEEDS:   Education needs have been addressed  Skin:  Skin Assessment: Reviewed RN Assessment  Last BM:  11/18 - type 4  Height:  Ht Readings from Last 1 Encounters:  07/16/24 5' 2 (1.575 m)    Weight:  Wt Readings from Last 1 Encounters:  07/22/24 80.1 kg    Ideal Body Weight:  50 kg  BMI:  Body mass index is 32.3 kg/m.  Estimated Nutritional Needs:  Kcal:  1600-1800 kcal/d Protein:  75-90 g/d Fluid:  >/=1.6L/d    Vernell Lukes, RD, LDN, CNSC Registered Dietitian II Please reach out via secure chat

## 2024-07-22 NOTE — Progress Notes (Signed)
 PHARMACY - ANTICOAGULATION CONSULT NOTE  Pharmacy Consult for Lovenox  > Heparin  Indication: History of DVT/PE  Allergies  Allergen Reactions   Hydrocodone Shortness Of Breath, Dermatitis and Other (See Comments)    I forget to breathe.   Clopidogrel Hives and Itching    Patient Measurements: Height: 5' 2 (157.5 cm) Weight: 80.1 kg (176 lb 9.4 oz) IBW/kg (Calculated) : 50.1 HEPARIN  DW (KG): 67.2  Vital Signs: Temp: 98.9 F (37.2 C) (11/19 1924) Temp Source: Oral (11/19 1924) BP: 169/61 (11/19 1924) Pulse Rate: 87 (11/19 1924)  Labs: Recent Labs    07/20/24 1120 07/21/24 1107 07/22/24 0833  HGB 12.2 11.6* 10.7*  HCT 37.5 36.5 33.9*  PLT 280 257 209  CREATININE 0.90  --  0.77    Estimated Creatinine Clearance: 72.4 mL/min (by C-G formula based on SCr of 0.77 mg/dL).   Medical History: Past Medical History:  Diagnosis Date   Anemia    Arthritis    back, hands, hips (02/22/2015)   CAD (coronary artery disease)    a. complex LAD/diagonal bifurcation PCI in 2010. b. STEMI 10/2019 s/p PTCA/DES x1 to mLAD overlapping the old stent, residual disease treated medicaly.   Cancer [facility])    uterine   CHF (congestive heart failure) (HCC)    Depression    Diabetes mellitus (HCC)    started when I was pregnant; not sure if it was type 1 or type 2    History of radiation therapy    Endometrium- HDR 01/22/22-03/07/22- Dr. Lynwood Nasuti   Hypercholesterolemia    Hypertension    Morbid obesity (HCC)    Myocardial infarction (HCC)    mild x 3   Neuromuscular disorder (HCC)    neuropathy feet   Sleep apnea    cpap/    Medications:  Scheduled:   atorvastatin   80 mg Oral Daily   Chlorhexidine  Gluconate Cloth  6 each Topical Daily   feeding supplement (PROSource TF20)  60 mL Per Tube Daily   free water   200 mL Per Tube Q6H   insulin  aspart  0-15 Units Subcutaneous Q4H   insulin  glargine-yfgn  20 Units Subcutaneous Daily   levETIRAcetam   500 mg Per Tube BID    lidocaine   1 patch Transdermal Daily   propranolol   20 mg Oral TID   sodium chloride  flush  10-40 mL Intracatheter Q12H   thiamine   100 mg Per Tube Daily   Infusions:   ampicillin  (OMNIPEN) IV 2 g (07/22/24 1739)   feeding supplement (OSMOLITE 1.5 CAL) 45 mL/hr at 07/22/24 1539   meropenem  (MERREM ) IV     [START ON 07/23/2024] vancomycin      PRN: acetaminophen  **OR** acetaminophen , fentaNYL  (SUBLIMAZE ) injection, haloperidol  lactate, labetalol , ondansetron  **OR** ondansetron  (ZOFRAN ) IV, oxyCODONE , prochlorperazine , senna-docusate, sodium chloride  flush  Assessment: 63 yo female with endometrial cancer on chronic lovenox  for hx DVT/PE to undergo LP, therefore, Pharmacy consulted to transition to heparin  drip. Last dose of lovenox  was today at 09:55. Hgb 10.7, Plts wnl.  Goal of Therapy:  Heparin  level 0.3-0.7 units/ml Monitor platelets by anticoagulation protocol: Yes   Plan:  Heparin  infusion 1200 units/hr Check heparin  level & CBC in 6hrs To stop tomorrow at midnight for LP on Fri 11/21 F/u anticoagulation resumption post procedure  Rocky Slade, PharmD, BCPS 07/22/2024,8:49 PM  Please check AMION for all [facility] Pharmacy phone numbers After 10:00 PM, call Main Pharmacy [phone]

## 2024-07-22 NOTE — Progress Notes (Signed)
 LTM maint complete - no skin breakdown under:  FZ,C4,T8

## 2024-07-22 NOTE — Progress Notes (Signed)
 Speech Language Pathology Treatment: Dysphagia  Patient Details Name: Brenda [name] MRN: [id number] DOB: [birth year] Today's Date: 07/22/2024 Time: 1111-1120 SLP Time Calculation (min) (ACUTE ONLY): 9 min  Assessment / Plan / Recommendation Clinical Impression  Planned to follow up tomorrow (following Palliative care meeting yesterday) however dietitian notified SLP that oldest daughter present with several questions. Pt sleeping and session focused on education. Pt now has Cortrak with husband expressed desire of wanting artificial feeding tube temporarily to assist in the patient recovering from the UTI and seizures, but knows that the patient would not want a permanent feeding tube per Palliative note. SLP updated daughter on session yesterday providing oral care and pt not alert/aware to attempt po's; was unable to close mouth around dry spoon. Daughter states she has wakeful periods but does not seem to have good control of motor movements. Agreed to change order to NPO (stop receiving trays), continue frequent oral care and ST will follow up next week for pt's status/alertness, ability to consume ice, sips water , education and continuing plan of care as discussions ongoing with family and Palliative care.    HPI HPI: Brenda [name] is a 63 y.o. 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  She presents to [facility], ED with reports of chills and weakness preventing her from ambulating.  Note she was recently discharged from [facility] where she was treated for a UTI and discharged on cefadroxil . Dx with recurrent UTI secondary to failed antibiotic attempt.      SLP Plan  Continue with current plan of care           Recommendations  Diet recommendations: NPO Medication Administration: Via alternative means                  Oral care QID   Frequent or constant Supervision/Assistance Dysphagia, oropharyngeal phase (R13.12)     Continue with current plan of care     Dustin Olam Bull  07/22/2024, 11:26 AM

## 2024-07-22 NOTE — Inpatient Diabetes Management (Signed)
 Inpatient Diabetes Program Recommendations  AACE/ADA: New Consensus Statement on Inpatient Glycemic Control (2015)  Target Ranges:  Prepandial:   less than 140 mg/dL      Peak postprandial:   less than 180 mg/dL (1-2 hours)      Critically ill patients:  140 - 180 mg/dL   Lab Results  Component Value Date   GLUCAP 153 (H) 07/22/2024   HGBA1C 7.2 (H) 06/01/2024    Review of Glycemic Control  Latest Reference Range & Units 07/21/24 19:48 07/21/24 23:45 07/22/24 04:28 07/22/24 07:57 07/22/24 11:40  Glucose-Capillary 70 - 99 mg/dL 750 (H) 762 (H) 809 (H) 150 (H) 153 (H)   Diabetes history: DM Outpatient Diabetes medications:  Tresiba  18 units daily Fiasp  1-3 units tid with meals  Metformin  1000 mg bid Current orders for Inpatient glycemic control:  Novolog  0-15 units q 4 hours Semglee  20 units daily Osmolite 1.5 cal to start today at 25 ml/hr and advance to 45 ml/hr  Inpatient Diabetes Program Recommendations:    If CBG's >180 mg/dL, consider adding Novolog  2 units q 4 hours for tube feed coverage.   Thanks,   Randall Bullocks, RN, BC-ADM Inpatient Diabetes Coordinator Pager [phone]  (8a-5p)

## 2024-07-22 NOTE — Progress Notes (Signed)
   07/21/24 1638  Assess: MEWS Score  Temp 99 F (37.2 C)  BP (!) 131/91  MAP (mmHg) 103  Pulse Rate (!) 118  ECG Heart Rate (!) 119  Resp 19  SpO2 100 %  O2 Device Room Air  Assess: MEWS Score  MEWS Temp 0  MEWS Systolic 0  MEWS Pulse 2  MEWS RR 0  MEWS LOC 2  MEWS Score 4  MEWS Score Color Red  Assess: if the MEWS score is Yellow or Red  Were vital signs accurate and taken at a resting state? Yes  Does the patient meet 2 or more of the SIRS criteria? Yes  Does the patient have a confirmed or suspected source of infection? Yes  MEWS guidelines implemented  Yes, red  Treat  MEWS Interventions Considered administering scheduled or prn medications/treatments as ordered  Take Vital Signs  Increase Vital Sign Frequency  Red: Q1hr x2, continue Q4hrs until patient remains green for 12hrs  Escalate  MEWS: Escalate Red: Discuss with charge nurse and notify provider. Consider notifying RRT. If remains red for 2 hours consider need for higher level of care  Notify: Charge Nurse/RN  Name of Charge Nurse/RN Notified [facility]  Provider Notification  Provider Name/Title Dr Mcarthur  Date Provider Notified 07/21/24  Time Provider Notified 1640  Method of Notification Page  Notification Reason Change in status  Provider response Evaluate remotely  Date of Provider Response 07/21/24  Time of Provider Response 1645  Assess: SIRS CRITERIA  SIRS Temperature  0  SIRS Respirations  0  SIRS Pulse 1  SIRS WBC 0  SIRS Score Sum  1   MEWs red, MD aware, family at bedside

## 2024-07-22 NOTE — Progress Notes (Signed)
 OT Cancellation Note  Patient Details Name: Brenda [name] MRN: [id number] DOB: 1960-12-08   Cancelled Treatment:    Reason Eval/Treat Not Completed: Other (comment) Per RN, pt not following commands, will hold and re-attempt as appropriate.   Vikas Wegmann M. [place], OTR/L [facility] Acute Rehabilitation Services [phone] Secure Chat Preferred  Keymarion Bearman 07/22/2024, 3:52 PM

## 2024-07-22 NOTE — Progress Notes (Signed)
 Daily Progress Note   Date: 07/22/2024   Patient Name: Brenda [name]  DOB: [birth year]  MRN: [id number]  Age / Sex: 63 y.o., female  Attending Physician: Mcarthur Pick, MD Primary Care Physician: [facility] Admit Date: 07/15/2024 Length of Stay: 5 days  Reason for Follow-up: Establishing goals of care  Past Medical History:  Diagnosis Date   Anemia    Arthritis    back, hands, hips (02/22/2015)   CAD (coronary artery disease)    a. complex LAD/diagonal bifurcation PCI in 2010. b. STEMI 10/2019 s/p PTCA/DES x1 to mLAD overlapping the old stent, residual disease treated medicaly.   Cancer [facility])    uterine   CHF (congestive heart failure) (HCC)    Depression    Diabetes mellitus (HCC)    started when I was pregnant; not sure if it was type 1 or type 2    History of radiation therapy    Endometrium- HDR 01/22/22-03/07/22- Dr. Lynwood Nasuti   Hypercholesterolemia    Hypertension    Morbid obesity (HCC)    Myocardial infarction (HCC)    mild x 3   Neuromuscular disorder (HCC)    neuropathy feet   Sleep apnea    cpap/    Assessment & Plan:   HPI/Patient Profile:  63 y.o. 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   Palliative medicine consulted for goals of care conversation.  SUMMARY OF RECOMMENDATIONS DNR-limited Open to all aggressive medical interventions including temporary artificial feeding tubes, family clear patient would not want to be prolonged interventions if she is unable to have a meaningful recovery Continue to monitor for improvement or deterioration for further discussion points with family  Symptom Management:  Per primary team  Code Status: DNR - Limited (DNR/DNI)  Prognosis: Unable to determine  Discharge Planning: To Be Determined  Discussed  with: Sigdel MD - notified primary team that palliative will continue follow along for improvement or deterioration for further goals of care conversation.   Subjective:   Subjective: Chart Reviewed. Updates received. Patient Assessed. Created space and opportunity for patient  and family to explore thoughts and feelings regarding current medical situation.  Patient received Fentanyl  50 mcg PRN x5 in the last 2 shifts. Will continue to monitor administration of analgesic and consider scheduling medications going forward if family is agreeable.   Today's Discussion: Today before meeting with the patient/family, I reviewed the chart notes including neurology EEG note by Dr. Shelton on 11/19 and SLP note on 11/19 by Dustin SLP. I also reviewed vital signs, nursing flowsheets, medication administrations record, labs, and imaging.  Per EEG note on 11/19 - waveforms improved compared to prior days. SLP note on 11/19 stated that family and staff agreeable to making patient NPO due to poor alertness as well as patient now having coretrak in place.   Discussed with daughter Brenda [name]) at the patient's bedside about our discussion from yesterday and changes since my last visit. Educated daughter that patient now has a coretrak for nutritional support and reiterated that this is temporary and that the plan is to continue to current management and monitoring for improvement or deterioration. Daughter inquired about disposition possibilities for patient including SNF, home with hospice, or inpatient hospice facility depending on clinical progress and family decision going forward. Provided information on the expectations going forward for each disposition depending on various  scenarios and patient outcomes.  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     Comments: Obtunded  HENT:     Head: Normocephalic.     Nose: Nose normal.     Comments: Coretrak in place.     Mouth/Throat:     Mouth: Mucous membranes are dry.  Cardiovascular:     Pulses: Normal pulses.  Pulmonary:     Effort: Pulmonary effort is normal.  Abdominal:     Palpations: Abdomen is soft.  Musculoskeletal:     Right lower leg: No edema.     Left lower leg: No edema.  Skin:    General: Skin is warm.     Capillary Refill: Capillary refill takes less than 2 seconds.     Palliative Assessment/Data: 10%   Existing Vynca/ACP Documentation: None  Thank you for allowing us  to participate in the care of Brenda [name] PMT will continue to support holistically.  I personally spent a total of 35 minutes in the care of the patient today including preparing to see the patient, performing a medically appropriate exam/evaluation, counseling and educating, referring and communicating with other health care professionals, and documenting clinical information in the EHR.  [name]  Palliative Medicine Team  Team Phone # [phone] (Nights/Weekends) 07/22/2024 12:26 PM

## 2024-07-22 NOTE — Procedures (Signed)
 Patient Name: Brenda [name]  MRN: [id number]  Epilepsy Attending: Arlin MALVA Krebs  Referring Physician/Provider: Khaliqdina, Salman, MD  Duration: 07/21/2024 9292 to 07/22/2024 9292   Patient history: 63yo F with ams. EEG to evaluate for seizure   Level of alertness: Awake/ lethargic   AEDs during EEG study: LEV   Technical aspects: This EEG study was done with scalp electrodes positioned according to the 10-20 International system of electrode placement. Electrical activity was reviewed with band pass filter of 1-70Hz , sensitivity of 7 uV/mm, display speed of 81mm/sec with a 60Hz  notched filter applied as appropriate. EEG data were recorded continuously and digitally stored.  Video monitoring was available and reviewed as appropriate.   Description: EEG showed continuous generalized 3 to 6 Hz theta-delta slowing.  Intermittently, EEG also showed generalized periodic discharges with triphasic morphology and fluctuating frequency of 2 to 2.5 Hz which gradually improved to 1-2hz . Hyperventilation and photic stimulation were not performed.       ABNORMALITY - Periodic discharges with triphasic morphology, generalized - Continuous slow, generalized   IMPRESSION: This study showed generalized periodic discharges ( GPDs) with triphasic morphology. At the beginning of the study, the frequency of GPDs was 2-2.5Hz  which gradually improved to 1-2hz . This EEG pattern is on the ictal-interictal continuum.    Additionally there is generalized cerebral dysfunction (encephalopathy).     EEG appears to be improving compared to previous day.    [name] [name]

## 2024-07-22 NOTE — Progress Notes (Addendum)
 PROGRESS NOTE    Brenda [name]  FMW:[id number] DOB: 1961/08/04 DOA: 07/15/2024 PCP: [facility]   Brief Narrative:  63 year old female with history of metastatic endometrial cancer, chronic cancer-related pain, hypertension, hyperlipidemia, diabetes mellitus, depression, anxiety, CVA, history of DVT and PE anticoagulated on Lovenox , OSA, chronic hypoxic respiratory failure on 2L via nasal cannula and recent hospitalization and discharged on 07/14/2024 for UTI treated with IV antibiotics followed by oral antibiotics on discharge presented with chills and weakness.  She was found to have UTI and was started on IV cefepime .  Unfortunately mental status worsened and she was transferred to [facility] for continuous EEG monitoring.  Assessment & Plan:  Acute metabolic encephalopathy Subclinical seizures -Patient has had intermittent confusion since admission and has been slow to respond to secondary to UTI.  However on 11/15 CT head worsening status with the stiffening of upper extremities, patient transferred to [facility] for continuous video EEG.  MRI brain on 11/15 is negative for acute findings.  Shows evolution of ischemic infarcts similar to before.   IV cefepime  could be culprit and this was discontinued. Husband today says when he called 911 pt was having similar symptoms of bilateral upper extremities movement and change in mentation Ongoing Continuous video EEG 4 seizures noted on 11/16, keppra given 11/16 night. Will continue on keppra 500 bid per neurology  UTI: Present on admission, failed p.o. antibiotics - Blood and urine cultures negative so far.  Continue IV meropenem (start 11/15. Will complete for 7 days  Low-grade temperature: Port site appears normal.  Mild leukocytosis, will follow blood cultures.    Endometrial cancer with metastases Chronic cancer-related pain with opiate dependence.  Will continue fentanyl , oxy as needed for pain. Fentanyl  patch could be  an option.  Will defer to palliative care. - Outpatient follow-up with oncology/Dr. Lonn.  Continue current pain management. - family requested second opinion at [facility].  Referral will be sent for outpatient  Hypomagnesemia, replace as needed.  Hyperthyroidism, TSH is 0.23,  free T4 level elevated 1.4.  Mild suppression of TSH.  Likely from ongoing illness.  Antithyroid medications are not indication at this time.  Will add propranolol to help with tremors.  Patient additionally hypertensive and tachycardic.  Chronic respiratory failure with hypoxia OSA - Respiratory status stable.  Continue supplemental oxygen .  Continue CPAP at night  History of PE and DVT - Continue Lovenox   Diabetes mellitus type 2 with intermittent hypoglycemia -Will continue Lantus  to 20 units daily and continue with sliding scale insulin  coverage.  Hypomagnesemia - Resolved  Hypernatremia: Na 150. Will start NG feeding, Free water flushes 200q6h. Am labs  Anemia of chronic disease - From cancer.  Hemoglobin stable.  No signs of bleeding.  Monitor intermittently  Depression/anxiety - Prozac , Remeron, Haldol discontinued as discussed above.    History of CAD History of stroke Hyperlipidemia -Continue statin.  Not on aspirin  since she is on treatment dose Lovenox .  Obesity class I - Outpatient follow-up  Physical deconditioning - Likely will need SNF pending her clinical course.  Nutrition: On IV hydration.  Plan for cortrack.  Palliative care following  DVT prophylaxis: Lovenox  therapeutic dosing Code Status: DNR Family Communication: daughter at the bedside disposition Plan: Status is:  inpatient because: Severity of illness    Consultants: Neuro  Procedures: None  Antimicrobials: Cefepime  from 07/16/2024 -11/14, Meropenem 11/15-   Subjective:  Patient seen and examined at the bedside.  Daughter is present.  Patient remains lethargic.  She was  able to open eyes with verbal command.   Vital signs have been stable.  She is borderline tachycardic and hypertensive.   Objective: Vitals:   07/22/24 0500 07/22/24 0734 07/22/24 0810 07/22/24 1154  BP:  (!) 174/83 (!) 166/80 (!) 157/82  Pulse:  (!) 104  99  Resp:  18  18  Temp:  99.8 F (37.7 C)  98.2 F (36.8 C)  TempSrc:  Oral  Oral  SpO2:  100%  99%  Weight: 80.1 kg     Height:        Intake/Output Summary (Last 24 hours) at 07/22/2024 1434 Last data filed at 07/21/2024 1453 Gross per 24 hour  Intake --  Output 700 ml  Net -700 ml   Filed Weights   07/20/24 0500 07/21/24 0500 07/22/24 0500  Weight: 81.8 kg 80.2 kg 80.1 kg    Examination:  General: lethargic, does not follow commands Chest: Clear CVs: S1, S2, no murmur, regular rhythm Abdomen: Soft, nontender Extremities: No edema Neuro: lethargic, does not follow commands, both upper extremity tremor, video EEG continuing   Data Reviewed: I have personally reviewed following labs and imaging studies  CBC: Recent Labs  Lab 07/18/24 0554 07/19/24 0245 07/20/24 1120 07/21/24 1107 07/22/24 0833  WBC 4.8 5.5 11.8* 9.4 7.0  NEUTROABS 3.4 3.8 9.6* 7.1 4.7  HGB 10.4* 10.8* 12.2 11.6* 10.7*  HCT 33.2* 33.4* 37.5 36.5 33.9*  MCV 86.2 83.1 83.0 83.9 85.4  PLT 232 230 280 257 209   Basic Metabolic Panel: Recent Labs  Lab 07/17/24 0600 07/18/24 0554 07/19/24 0245 07/20/24 1120 07/22/24 0833  NA 140 139 136 140 150*  K 4.3 4.4 4.1 4.3 3.6  CL 104 103 101 108 113*  CO2 27 24 19* 16* 26  GLUCOSE 100* 189* 236* 288* 167*  BUN 9 9 11 8 15   CREATININE 0.61 0.56 0.76 0.90 0.77  CALCIUM  9.4 9.4 9.1 9.6 9.4  MG 1.6* 1.8 1.5* 1.4*  --    GFR: Estimated Creatinine Clearance: 72.4 mL/min (by C-G formula based on SCr of 0.77 mg/dL). Liver Function Tests: Recent Labs  Lab 07/16/24 0200 07/19/24 0245 07/22/24 0833  AST 22 25 19   ALT 23 29 18   ALKPHOS 83 76 57  BILITOT 0.2 1.2 0.2  PROT 6.8 7.0 6.5  ALBUMIN 3.5 3.2* 2.8*   No results for  input(s): LIPASE, AMYLASE in the last 168 hours. Recent Labs  Lab 07/19/24 0245  AMMONIA <13   Coagulation Profile: Recent Labs  Lab 07/16/24 0200  INR 1.0   Cardiac Enzymes: No results for input(s): CKTOTAL, CKMB, CKMBINDEX, TROPONINI in the last 168 hours. BNP (last 3 results) Recent Labs    05/22/24 0630  PROBNP 3,514.0*   HbA1C: No results for input(s): HGBA1C in the last 72 hours. CBG: Recent Labs  Lab 07/21/24 1948 07/21/24 2345 07/22/24 0428 07/22/24 0757 07/22/24 1140  GLUCAP 249* 237* 190* 150* 153*   Lipid Profile: No results for input(s): CHOL, HDL, LDLCALC, TRIG, CHOLHDL, LDLDIRECT in the last 72 hours. Thyroid  Function Tests: Recent Labs    07/20/24 1120 07/22/24 0833  TSH  --  0.315*  FREET4 1.41*  --    Anemia Panel: No results for input(s): VITAMINB12, FOLATE, FERRITIN, TIBC, IRON, RETICCTPCT in the last 72 hours.  Sepsis Labs: Recent Labs  Lab 07/16/24 0207  LATICACIDVEN 0.6    Recent Results (from the past 240 hours)  Blood Culture (routine x 2)     Status: None  Collection Time: 07/12/24 11:05 PM   Specimen: BLOOD  Result Value Ref Range Status   Specimen Description BLOOD LEFT ANTECUBITAL  Final   Special Requests   Final    BOTTLES DRAWN AEROBIC AND ANAEROBIC Blood Culture adequate volume   Culture   Final    NO GROWTH 5 DAYS Performed at [facility], [address] [place], [place] [address]    Report Status 07/17/2024 FINAL  Final  Urine Culture     Status: Abnormal   Collection Time: 07/12/24 11:14 PM   Specimen: Urine, Random  Result Value Ref Range Status   Specimen Description URINE, RANDOM  Final   Special Requests   Final    NONE Reflexed from [id number] Performed at [facility], [address], [place], [place] [address]    Culture >=100,000 COLONIES/mL ENTEROBACTER CLOACAE (A)  Final   Report Status 07/15/2024 FINAL  Final   Organism ID, Bacteria ENTEROBACTER  CLOACAE (A)  Final      Susceptibility   Enterobacter cloacae - MIC*    CEFEPIME  2 SENSITIVE Sensitive     CIPROFLOXACIN  <=0.06 SENSITIVE Sensitive     GENTAMICIN <=1 SENSITIVE Sensitive     NITROFURANTOIN 32 SENSITIVE Sensitive     TRIMETH /SULFA  <=20 SENSITIVE Sensitive     PIP/TAZO Value in next row Resistant      >=128 RESISTANTThis is a modified FDA-approved test that has been validated and its performance characteristics determined by the reporting laboratory.  This laboratory is certified under the Clinical Laboratory Improvement Amendments CLIA as qualified to perform high complexity clinical laboratory testing.    MEROPENEM  Value in next row Sensitive      >=128 RESISTANTThis is a modified FDA-approved test that has been validated and its performance characteristics determined by the reporting laboratory.  This laboratory is certified under the Clinical Laboratory Improvement Amendments CLIA as qualified to perform high complexity clinical laboratory testing.    * >=100,000 COLONIES/mL ENTEROBACTER CLOACAE  Blood Culture (routine x 2)     Status: None   Collection Time: 07/12/24 11:41 PM   Specimen: BLOOD  Result Value Ref Range Status   Specimen Description BLOOD RIGHT ANTECUBITAL  Final   Special Requests   Final    BOTTLES DRAWN AEROBIC AND ANAEROBIC Blood Culture results may not be optimal due to an inadequate volume of blood received in culture bottles   Culture   Final    NO GROWTH 5 DAYS Performed at [facility], [address] [place], [place] [address]    Report Status 07/18/2024 FINAL  Final  Urine Culture     Status: None   Collection Time: 07/16/24  1:43 AM   Specimen: Urine, Random  Result Value Ref Range Status   Specimen Description   Final    URINE, RANDOM Performed at [facility], [address], [place], [place] [address]    Special Requests   Final    NONE Reflexed from [id number] Performed at [facility], [address], [place], [place] [address]    Culture   Final    NO GROWTH Performed at [facility], [address] [place], [place] [address]    Report Status 07/17/2024 FINAL  Final  Blood Culture (routine x 2)     Status: None   Collection Time: 07/16/24  2:00 AM   Specimen: BLOOD  Result Value Ref Range Status   Specimen Description   Final  BLOOD RIGHT ANTECUBITAL Performed at [facility], [address] [place], [place] [address]    Special Requests   Final    Blood Culture adequate volume BOTTLES DRAWN AEROBIC AND ANAEROBIC Performed at [facility], [address], [place], [place] [address]    Culture   Final    NO GROWTH 5 DAYS Performed at [facility], [address], [place], [place] [address]    Report Status 07/21/2024 FINAL  Final  Blood Culture (routine x 2)     Status: None   Collection Time: 07/16/24  2:00 AM   Specimen: BLOOD  Result Value Ref Range Status   Specimen Description   Final    BLOOD LEFT ANTECUBITAL Performed at [facility], [address] [place], [place] [address]    Special Requests   Final    Blood Culture adequate volume BOTTLES DRAWN AEROBIC AND ANAEROBIC Performed at [facility], [address] [place], [place] [address]    Culture   Final    NO GROWTH 5 DAYS Performed at [facility], [address] [place], [place] [address]    Report Status 07/21/2024 FINAL  Final  Culture, blood (Routine X 2) w Reflex to ID Panel     Status: None (Preliminary result)   Collection Time: 07/21/24 11:07 AM   Specimen: BLOOD RIGHT HAND  Result Value Ref Range Status   Specimen Description BLOOD RIGHT HAND  Final   Special Requests   Final    BOTTLES DRAWN AEROBIC AND ANAEROBIC Blood Culture results may not be optimal due to an inadequate volume of blood received in culture bottles   Culture   Final    NO GROWTH < 24 HOURS Performed at [facility], [address], [place], [place] [address]    Report Status PENDING  Incomplete  Culture, blood (Routine X 2) w Reflex to ID Panel     Status: None (Preliminary result)   Collection Time: 07/21/24 11:07 AM   Specimen: BLOOD LEFT ARM  Result Value Ref Range Status   Specimen Description BLOOD LEFT ARM  Final   Special Requests   Final    BOTTLES DRAWN AEROBIC AND ANAEROBIC Blood Culture adequate volume   Culture   Final    NO GROWTH < 24 HOURS Performed at [facility], [address], [place], [place] [address]    Report Status PENDING  Incomplete         Radiology Studies: DG Abd Portable 1V Result Date: 07/22/2024 CLINICAL DATA:  Feeding tube placement. EXAM: PORTABLE ABDOMEN - 1 VIEW COMPARISON:  12/08/2021. FINDINGS: Enteric tube tip overlies the distal stomach. IMPRESSION: Enteric tube tip in the distal stomach. Electronically Signed   By: Harrietta Sherry M.D.   On: 07/22/2024 11:33        Scheduled Meds:  atorvastatin   80 mg Oral Daily   Chlorhexidine  Gluconate Cloth  6 each Topical Daily   enoxaparin   1 mg/kg Subcutaneous BID   feeding supplement (PROSource TF20)  60 mL Per Tube Daily   free water  200 mL Per Tube Q6H   insulin  aspart  0-15 Units Subcutaneous Q4H   insulin  glargine-yfgn  20 Units Subcutaneous Daily   levETIRAcetam  500 mg Per Tube BID   lidocaine   1 patch Transdermal Daily   propranolol  20 mg Oral TID   sodium chloride  flush  10-40 mL Intracatheter Q12H  thiamine  100 mg Per Tube Daily   Continuous Infusions:  feeding supplement (OSMOLITE 1.5 CAL)     meropenem (MERREM) IV 1 g (07/22/24 1429)          Derryl Duval, MD [facility] 07/22/2024, 2:34 PM

## 2024-07-22 NOTE — Progress Notes (Signed)
 Pharmacy Antibiotic Note  Brenda [name] is a 63 y.o. female admitted on 07/15/2024 with meningitis.  Pharmacy has been consulted for vancomycin , meropenem and ampicillin dosing. Patient has been on meropenem for pseudomonas UTI but now with continued AMS and low fevers, will expand to full mengitis coverage.Meropenem covers Listeria in vitro but some studies show worse mortality than ampicillin so will add ampicillin.   Plan: Vancomycin  1500mg  x1 then 1000mg  q12hr, goal trough 15-20 Meropenem 2g q8hr Ampicillin 2g q6hr Monitor cultures, clinical status, renal function, vancomycin  level Narrow abx as able and f/u duration    Height: 5' 2 (157.5 cm) Weight: 80.1 kg (176 lb 9.4 oz) IBW/kg (Calculated) : 50.1  Temp (24hrs), Avg:99.7 F (37.6 C), Min:98.2 F (36.8 C), Max:100.9 F (38.3 C)  Recent Labs  Lab 07/16/24 0207 07/17/24 0600 07/18/24 0554 07/19/24 0245 07/20/24 1120 07/21/24 1107 07/22/24 0833  WBC  --  4.2 4.8 5.5 11.8* 9.4 7.0  CREATININE  --  0.61 0.56 0.76 0.90  --  0.77  LATICACIDVEN 0.6  --   --   --   --   --   --     Estimated Creatinine Clearance: 72.4 mL/min (by C-G formula based on SCr of 0.77 mg/dL).    Allergies  Allergen Reactions   Hydrocodone Shortness Of Breath, Dermatitis and Other (See Comments)    I forget to breathe.   Clopidogrel Hives and Itching    Antimicrobials this admission: Ceftriaxone  11/10>>11/11 > dc'd on Cefadroxil  then readmitted on 11/12 late pm Ceftriaxone  x 1 on 11/13 Cefepime  11/13>>11/16 Meropenem 11/16>> Vancomycin  11/19>> Amp 11/1>>   Dose adjustments this admission: 11/19 mero to 2g q8 for meningitis   Microbiology results: 11/9 urine: >100K/ml Enterobacter - sens Cefepime , cipro , mero, septra , R Zosyn 11/13 blood: ng   11/13 urine: ng 11/18 Bcx ngtd  Thank you for allowing pharmacy to be a part of this patient's care.  Jinnie Door, PharmD, BCPS, BCCP Clinical Pharmacist  Please check AMION for all  [facility] Pharm[facility] phone numbers After 10:00 PM, call Main Pharmacy [phone]

## 2024-07-23 ENCOUNTER — Telehealth: Payer: Self-pay

## 2024-07-23 ENCOUNTER — Inpatient Hospital Stay (HOSPITAL_COMMUNITY)

## 2024-07-23 DIAGNOSIS — T361X4A Poisoning by cephalosporins and other beta-lactam antibiotics, undetermined, initial encounter: Secondary | ICD-10-CM | POA: Diagnosis not present

## 2024-07-23 DIAGNOSIS — R4182 Altered mental status, unspecified: Secondary | ICD-10-CM | POA: Diagnosis not present

## 2024-07-23 DIAGNOSIS — Z515 Encounter for palliative care: Secondary | ICD-10-CM | POA: Diagnosis not present

## 2024-07-23 DIAGNOSIS — N390 Urinary tract infection, site not specified: Secondary | ICD-10-CM | POA: Diagnosis not present

## 2024-07-23 DIAGNOSIS — R569 Unspecified convulsions: Secondary | ICD-10-CM | POA: Diagnosis not present

## 2024-07-23 DIAGNOSIS — G9341 Metabolic encephalopathy: Secondary | ICD-10-CM | POA: Diagnosis not present

## 2024-07-23 DIAGNOSIS — G9340 Encephalopathy, unspecified: Secondary | ICD-10-CM | POA: Diagnosis not present

## 2024-07-23 DIAGNOSIS — Z7189 Other specified counseling: Secondary | ICD-10-CM | POA: Diagnosis not present

## 2024-07-23 LAB — CBC WITH DIFFERENTIAL/PLATELET
Abs Immature Granulocytes: 0.03 K/uL (ref 0.00–0.07)
Basophils Absolute: 0.1 K/uL (ref 0.0–0.1)
Basophils Relative: 1 %
Eosinophils Absolute: 0.1 K/uL (ref 0.0–0.5)
Eosinophils Relative: 1 %
HCT: 34.4 % — ABNORMAL LOW (ref 36.0–46.0)
Hemoglobin: 10.7 g/dL — ABNORMAL LOW (ref 12.0–15.0)
Immature Granulocytes: 0 %
Lymphocytes Relative: 18 %
Lymphs Abs: 1.5 K/uL (ref 0.7–4.0)
MCH: 27.2 pg (ref 26.0–34.0)
MCHC: 31.1 g/dL (ref 30.0–36.0)
MCV: 87.3 fL (ref 80.0–100.0)
Monocytes Absolute: 0.6 K/uL (ref 0.1–1.0)
Monocytes Relative: 7 %
Neutro Abs: 6.3 K/uL (ref 1.7–7.7)
Neutrophils Relative %: 73 %
Platelets: 188 K/uL (ref 150–400)
RBC: 3.94 MIL/uL (ref 3.87–5.11)
RDW: 16.8 % — ABNORMAL HIGH (ref 11.5–15.5)
WBC: 8.6 K/uL (ref 4.0–10.5)
nRBC: 0 % (ref 0.0–0.2)

## 2024-07-23 LAB — GLUCOSE, CAPILLARY
Glucose-Capillary: 315 mg/dL — ABNORMAL HIGH (ref 70–99)
Glucose-Capillary: 251 mg/dL — ABNORMAL HIGH (ref 70–99)
Glucose-Capillary: 234 mg/dL — ABNORMAL HIGH (ref 70–99)
Glucose-Capillary: 243 mg/dL — ABNORMAL HIGH (ref 70–99)
Glucose-Capillary: 302 mg/dL — ABNORMAL HIGH (ref 70–99)

## 2024-07-23 LAB — BASIC METABOLIC PANEL WITH GFR
Anion gap: 12 (ref 5–15)
BUN: 16 mg/dL (ref 8–23)
CO2: 25 mmol/L (ref 22–32)
Calcium: 9.2 mg/dL (ref 8.9–10.3)
Chloride: 109 mmol/L (ref 98–111)
Creatinine, Ser: 0.68 mg/dL (ref 0.44–1.00)
GFR, Estimated: >60 mL/min (ref >60)
Glucose, Bld: 308 mg/dL — ABNORMAL HIGH (ref 70–99)
Potassium: 3.5 mmol/L (ref 3.5–5.1)
Sodium: 146 mmol/L — ABNORMAL HIGH (ref 135–145)

## 2024-07-23 LAB — MAGNESIUM: Magnesium: 1.6 mg/dL — ABNORMAL LOW (ref 1.7–2.4)

## 2024-07-23 LAB — THYROTROPIN RECEPTOR AUTOABS: Thyrotropin Receptor Ab: <1.1 IU/L (ref 0.00–1.75)

## 2024-07-23 LAB — HEPARIN LEVEL (UNFRACTIONATED)
Heparin Unfractionated: >1.1 [IU]/mL — ABNORMAL HIGH (ref 0.30–0.70)
Heparin Unfractionated: 0.9 [IU]/mL — ABNORMAL HIGH (ref 0.30–0.70)

## 2024-07-23 LAB — T3, FREE: T3, Free: 2.4 pg/mL (ref 2.0–4.4)

## 2024-07-23 LAB — PHOSPHORUS: Phosphorus: 2.1 mg/dL — ABNORMAL LOW (ref 2.5–4.6)

## 2024-07-23 MED ORDER — MAGNESIUM SULFATE 2 GM/50ML IV SOLN
2.0000 g | Freq: Once | INTRAVENOUS | Status: AC
  Administered 2024-07-23: 2 g via INTRAVENOUS
  Filled 2024-07-23: qty 50

## 2024-07-23 MED ORDER — INSULIN GLARGINE-YFGN 100 UNIT/ML ~~LOC~~ SOLN
30.0000 [IU] | Freq: Every day | SUBCUTANEOUS | Status: DC
  Administered 2024-07-23 – 2024-07-24 (×2): 30 [IU] via SUBCUTANEOUS
  Filled 2024-07-23 (×2): qty 0.3

## 2024-07-23 MED ORDER — PROPRANOLOL HCL 20 MG PO TABS
20.0000 mg | ORAL_TABLET | Freq: Three times a day (TID) | ORAL | Status: DC
  Administered 2024-07-23 – 2024-09-01 (×101): 20 mg
  Filled 2024-07-23: qty 2
  Filled 2024-07-23 (×2): qty 1
  Filled 2024-07-23 (×3): qty 2
  Filled 2024-07-23 (×3): qty 1
  Filled 2024-07-23 (×2): qty 2
  Filled 2024-07-23: qty 1
  Filled 2024-07-23 (×2): qty 2
  Filled 2024-07-23: qty 1
  Filled 2024-07-23: qty 2
  Filled 2024-07-23: qty 1
  Filled 2024-07-23: qty 2
  Filled 2024-07-23: qty 1
  Filled 2024-07-23 (×4): qty 2
  Filled 2024-07-23: qty 1
  Filled 2024-07-23 (×2): qty 2
  Filled 2024-07-23: qty 1
  Filled 2024-07-23 (×7): qty 2
  Filled 2024-07-23: qty 1
  Filled 2024-07-23 (×7): qty 2
  Filled 2024-07-23: qty 1
  Filled 2024-07-23 (×4): qty 2
  Filled 2024-07-23 (×2): qty 1
  Filled 2024-07-23: qty 2
  Filled 2024-07-23 (×3): qty 1
  Filled 2024-07-23: qty 2
  Filled 2024-07-23 (×2): qty 1
  Filled 2024-07-23: qty 2
  Filled 2024-07-23: qty 1
  Filled 2024-07-23 (×3): qty 2
  Filled 2024-07-23: qty 1
  Filled 2024-07-23: qty 2
  Filled 2024-07-23: qty 1
  Filled 2024-07-23 (×3): qty 2
  Filled 2024-07-23 (×2): qty 1
  Filled 2024-07-23: qty 2
  Filled 2024-07-23: qty 1
  Filled 2024-07-23 (×5): qty 2
  Filled 2024-07-23 (×6): qty 1
  Filled 2024-07-23: qty 2
  Filled 2024-07-23 (×3): qty 1
  Filled 2024-07-23: qty 2
  Filled 2024-07-23: qty 1
  Filled 2024-07-23 (×2): qty 2
  Filled 2024-07-23 (×2): qty 1
  Filled 2024-07-23 (×4): qty 2
  Filled 2024-07-23 (×2): qty 1
  Filled 2024-07-23 (×2): qty 2
  Filled 2024-07-23: qty 1
  Filled 2024-07-23 (×2): qty 2
  Filled 2024-07-23: qty 1
  Filled 2024-07-23: qty 2
  Filled 2024-07-23 (×2): qty 1
  Filled 2024-07-23 (×3): qty 2
  Filled 2024-07-23: qty 1
  Filled 2024-07-23 (×3): qty 2
  Filled 2024-07-23: qty 1
  Filled 2024-07-23: qty 2
  Filled 2024-07-23 (×2): qty 1
  Filled 2024-07-23: qty 2
  Filled 2024-07-23 (×6): qty 1
  Filled 2024-07-23: qty 2
  Filled 2024-07-23: qty 1
  Filled 2024-07-23: qty 2
  Filled 2024-07-23: qty 1
  Filled 2024-07-23 (×2): qty 2
  Filled 2024-07-23 (×3): qty 1
  Filled 2024-07-23 (×3): qty 2
  Filled 2024-07-23: qty 1
  Filled 2024-07-23 (×2): qty 2
  Filled 2024-07-23: qty 1
  Filled 2024-07-23 (×2): qty 2
  Filled 2024-07-23 (×2): qty 1
  Filled 2024-07-23: qty 2
  Filled 2024-07-23: qty 1
  Filled 2024-07-23: qty 2
  Filled 2024-07-23 (×4): qty 1
  Filled 2024-07-23 (×3): qty 2
  Filled 2024-07-23: qty 1
  Filled 2024-07-23: qty 2
  Filled 2024-07-23: qty 1
  Filled 2024-07-23: qty 2
  Filled 2024-07-23: qty 1
  Filled 2024-07-23 (×2): qty 2
  Filled 2024-07-23: qty 1
  Filled 2024-07-23: qty 2
  Filled 2024-07-23 (×2): qty 1
  Filled 2024-07-23: qty 2
  Filled 2024-07-23 (×3): qty 1
  Filled 2024-07-23: qty 2
  Filled 2024-07-23 (×3): qty 1
  Filled 2024-07-23: qty 2
  Filled 2024-07-23: qty 1
  Filled 2024-07-23: qty 2
  Filled 2024-07-23 (×4): qty 1
  Filled 2024-07-23: qty 2
  Filled 2024-07-23 (×2): qty 1
  Filled 2024-07-23 (×2): qty 2

## 2024-07-23 MED ORDER — K PHOS MONO-SOD PHOS DI & MONO 155-852-130 MG PO TABS
250.0000 mg | ORAL_TABLET | Freq: Two times a day (BID) | ORAL | Status: AC
Start: 2024-07-23 — End: 2024-07-27
  Administered 2024-07-23 – 2024-07-27 (×10): 250 mg
  Filled 2024-07-23 (×11): qty 1

## 2024-07-23 MED ORDER — INSULIN ASPART 100 UNIT/ML IJ SOLN
4.0000 [IU] | INTRAMUSCULAR | Status: DC
  Administered 2024-07-23 – 2024-08-11 (×102): 4 [IU] via SUBCUTANEOUS
  Filled 2024-07-23 (×96): qty 4

## 2024-07-23 MED ORDER — HEPARIN (PORCINE) 25000 UT/250ML-% IV SOLN: 800.0000 [IU]/h | INTRAVENOUS | Status: DC

## 2024-07-23 NOTE — Progress Notes (Signed)
 Physical Therapy Treatment Patient Details Name: Brenda [name] MRN: [id number] DOB: 1961/08/24 Today's Date: 07/23/2024   History of Present Illness 63 yo female who returned to hospital on 07/15/2024 with ongoing signs and symptoms of UTI with generalized weakness. Pt with 4 seizures on EEG 11/16. Working diagnosis of cefepime  neurotoxicity. Prior admit 11/9 with urinary symptoms and AMS d/c home on OP ABX. PMH includes but is not limited to: HTN, HLD, DM, depression/anxiety, CVA (CIR recently), DVT/PE, OSA on CPAP, chronic respiratory failure on 2 L/min at baseline, and metastatic endometrial cancer with chronic cancer-related pain.    PT Comments  Pt received in supine with daughter present and supportive throughout session. Pt was not able to follow commands and would primarily keep eyes closed. When her eyes were open, pt had a L lateral gaze deviation and was unable to track. Pt also kept her head positioned in a left lateral and rotated position with assist needed to maintain neutral alignment. Performed LE PROM exercises with education provided to daughter. Also demonstrated how to don/doff PRAFO boots with daughter able to demonstrate proper technique. Moved bed into chair position with TotalAx2 needed to assist with repositioning. Continue to recommend <3hrs post acute rehab with acute PT to follow.    If plan is discharge home, recommend the following: Two people to help with walking and/or transfers;Two people to help with bathing/dressing/bathroom;Assistance with feeding   Can travel by private vehicle     No  Equipment Recommendations  Hospital bed;Hoyer lift       Precautions / Restrictions Precautions Precautions: Fall;Other (comment) Recall of Precautions/Restrictions: Impaired Precaution/Restrictions Comments: Seizure, NGT, continuous EEG Restrictions Weight Bearing Restrictions Per Provider Order: No     Mobility  Bed Mobility Overal bed mobility: Needs  Assistance Bed Mobility: Rolling Rolling: Total assist, +2 for physical assistance    General bed mobility comments: TotalA x2 to roll for repositioning. Used bed features to move bed into chair position    Transfers  General transfer comment: deferred 2/2 level of alertness         Balance Overall balance assessment: Needs assistance     Communication Communication Communication: Impaired Factors Affecting Communication: Difficulty expressing self  Cognition Arousal: Stuporous Behavior During Therapy: Flat affect   PT - Cognitive impairments: Difficult to assess Difficult to assess due to: Level of arousal    PT - Cognition Comments: Primarily kept eyes closed with pt opening eyes slightly with movement. Not able to follow commands or visually track Following commands: Impaired Following commands impaired:  (not following commands)    Cueing Cueing Techniques: Verbal cues, Tactile cues, Visual cues  Exercises General Exercises - Lower Extremity Ankle Circles/Pumps: PROM, Both, 10 reps, Supine Long Arc Quad: PROM, Both, 10 reps, Supine Heel Slides: PROM, Both, 10 reps, Supine Hip ABduction/ADduction: PROM, Both, 10 reps, Supine Other Exercises Other Exercises: bilateral cervical rotation PROM, x5        Pertinent Vitals/Pain Pain Assessment Breathing: normal Negative Vocalization: none Facial Expression: smiling or inexpressive Body Language: relaxed Consolability: no need to console PAINAD Score: 0 Pain Intervention(s): Repositioned     PT Goals (current goals can now be found in the care plan section) Acute Rehab PT Goals PT Goal Formulation: With patient Time For Goal Achievement: 07/31/24 Potential to Achieve Goals: Fair Progress towards PT goals: Progressing toward goals    Frequency    Min 2X/week           Co-evaluation   Reason for Co-Treatment: To  address functional/ADL transfers;Necessary to address cognition/behavior during  functional activity;Complexity of the patient's impairments (multi-system involvement) PT goals addressed during session: Mobility/safety with mobility;Strengthening/ROM        AM-PAC PT 6 Clicks Mobility   Outcome Measure  Help needed turning from your back to your side while in a flat bed without using bedrails?: Total Help needed moving from lying on your back to sitting on the side of a flat bed without using bedrails?: Total Help needed moving to and from a bed to a chair (including a wheelchair)?: Total Help needed standing up from a chair using your arms (e.g., wheelchair or bedside chair)?: Total Help needed to walk in hospital room?: Total Help needed climbing 3-5 steps with a railing? : Total 6 Click Score: 6    End of Session Equipment Utilized During Treatment: Oxygen  Activity Tolerance: Other (comment) (limited due to cognition) Patient left: in bed;with call bell/phone within reach;with bed alarm set;with family/visitor present Nurse Communication: Mobility status PT Visit Diagnosis: Other abnormalities of gait and mobility (R26.89);Muscle weakness (generalized) (M62.81);Unsteadiness on feet (R26.81);History of falling (Z91.81)     Time: 8993-8964 PT Time Calculation (min) (ACUTE ONLY): 29 min  Charges:    $Therapeutic Activity: 8-22 mins PT General Charges $$ ACUTE PT VISIT: 1 Visit                    Kate ORN, PT, DPT Secure Chat Preferred  Rehab Office [phone]    Kate BRAVO [name] 07/23/2024, 11:33 AM

## 2024-07-23 NOTE — Procedures (Addendum)
 Patient Name: Brenda [name]  MRN: [id number]  Epilepsy Attending: Arlin MALVA Krebs  Referring Physician/Provider: Khaliqdina, Salman, MD  Duration: 07/22/2024 9292 to 07/23/2024 9292   Patient history: 63yo F with ams. EEG to evaluate for seizure   Level of alertness: Awake/ lethargic   AEDs during EEG study: LEV   Technical aspects: This EEG study was done with scalp electrodes positioned according to the 10-20 International system of electrode placement. Electrical activity was reviewed with band pass filter of 1-70Hz , sensitivity of 7 uV/mm, display speed of 24mm/sec with a 60Hz  notched filter applied as appropriate. EEG data were recorded continuously and digitally stored.  Video monitoring was available and reviewed as appropriate.   Description: EEG showed continuous generalized 3 to 6 Hz theta-delta slowing.  Hyperventilation and photic stimulation were not performed.       ABNORMALITY - Continuous slow, generalized   IMPRESSION: This study is suggestive of generalized cerebral dysfunction (encephalopathy). No seizures or definite epileptiform discharges were noted.    Ellissa Ayo O Manjit Bufano

## 2024-07-23 NOTE — Progress Notes (Signed)
 LTM EEG D/C'd. No noted skin break down. [facility] notified.

## 2024-07-23 NOTE — Plan of Care (Signed)
   Problem: Education: Goal: Ability to describe self-care measures that may prevent or decrease complications (Diabetes Survival Skills Education) will improve Outcome: Progressing Goal: Individualized Educational Video(s) Outcome: Progressing

## 2024-07-23 NOTE — Progress Notes (Signed)
 PROGRESS NOTE    Brenda [name]  FMW:[id number] DOB: Apr 15, 1961 DOA: 07/15/2024 PCP: [facility]   Brief Narrative:  63 year old female with history of metastatic endometrial cancer, chronic cancer-related pain, hypertension, hyperlipidemia, diabetes mellitus, depression, anxiety, CVA, history of DVT and PE anticoagulated on Lovenox , OSA, chronic hypoxic respiratory failure on 2L via nasal cannula and recent hospitalization and discharged on 07/14/2024 for UTI treated with IV antibiotics followed by oral antibiotics on discharge presented with chills and weakness.  She was found to have UTI and was started on IV cefepime .  Unfortunately mental status worsened and she was transferred to [facility] for continuous EEG monitoring. Started on Keppra 11/16, mental status remains altered 11/19: Broad-spectrum antibiotics to cover CNS  11/20: Video EEG discontinued  Assessment & Plan:  Acute metabolic encephalopathy Subclinical seizures - Encephalopathy prior to admission thought to be secondary to UTI.  Started on cefepime  with worsening mental status.  On 11/15, stiffening of upper extremities, patient transferred to [facility] for continuous video EEG.  MRI brain on 11/15 is negative for acute findings.  Shows evolution of ischemic infarcts similar to before.   IV cefepime  could be culprit and this was discontinued. Husband today says when he called 911 pt was having similar symptoms of bilateral upper extremities movement and change in mentation 4 seizures noted on 11/16, keppra given 11/16 night. Will continue on keppra 500 bid per neurology Continuous video EEG discontinued 11/20 with stable EEG. Discussed with neurology, broad-spectrum antibiotics for minimal encephalitis added 11/19.  IR consult for LP.  UTI: Present on admission, failed p.o. antibiotics - Blood and urine cultures negative so far.  Continue IV meropenem (start 11/15. Will complete for 7 days  Low-grade  temperature: Port site appears normal.  Mild leukocytosis, will follow blood cultures.    Endometrial cancer with metastases Chronic cancer-related pain with opiate dependence.  Will continue fentanyl , oxy as needed for pain. Fentanyl  patch could be an option.  Will defer to palliative care. - Outpatient follow-up with oncology/Dr. Lonn.  Continue current pain management. - family requested second opinion at [facility].  Referral will be sent for outpatient  Hypomagnesemia, replace as needed.  Hyperthyroidism, TSH is 0.23,  free T4 level elevated 1.4.  Mild suppression of TSH.  Likely from ongoing illness.  Antithyroid medications are not indication at this time.  Will add propranolol to help with tremors.  Patient additionally hypertensive and tachycardic.  Chronic respiratory failure with hypoxia OSA - Respiratory status stable.  Continue supplemental oxygen .  Continue CPAP at night  History of PE and DVT - Continue Lovenox   Diabetes mellitus type 2 with intermittent hypoglycemia -Will increase Lantus  30 unit daily, as scheduled NovoLog  4 unit every 4, and continue with sliding scale insulin  coverage.  Hypomagnesemia - Resolved  Hypernatremia: Na 150.,  Improving to 146.  Free water flushes 200q6h. Am labs  Anemia of chronic disease - From cancer.  Hemoglobin stable.  No signs of bleeding.  Monitor intermittently  Depression/anxiety - Prozac , Remeron, Haldol discontinued as discussed above.    History of CAD History of stroke Hyperlipidemia -Continue statin.  Not on aspirin  since she is on treatment dose anticoagulation Obesity class I - Outpatient follow-up  Physical deconditioning - Likely will need SNF pending her clinical course.  Nutrition: On IV hydration.  Plan for cortrack.  Palliative care following  DVT prophylaxis: Heparin  therapeutic dose.   Code Status: DNR Family Communication: daughter at the bedside disposition Plan: Status is:  inpatient because:  Severity of illness    Consultants: Neuro  Procedures: None  Antimicrobials: Cefepime  from 07/16/2024 -11/14, Meropenem 11/15-   Subjective:  Patient seen and examined at the bedside.  Daughter is present.  Patient remains poorly responsive.  Vital signs are stable.  Afebrile.    Objective: Vitals:   07/23/24 0342 07/23/24 0736 07/23/24 1152 07/23/24 1541  BP: (!) 171/73 (!) 161/71 (!) 135/58 120/61  Pulse: 85 94 80 83  Resp:  18 18 19   Temp: 98.9 F (37.2 C) 99 F (37.2 C) 98.2 F (36.8 C) 99.8 F (37.7 C)  TempSrc: Oral Oral Oral Oral  SpO2: 100% 100% 100% 100%  Weight:      Height:        Intake/Output Summary (Last 24 hours) at 07/23/2024 1554 Last data filed at 07/23/2024 1221 Gross per 24 hour  Intake 800 ml  Output --  Net 800 ml   Filed Weights   07/20/24 0500 07/21/24 0500 07/22/24 0500  Weight: 81.8 kg 80.2 kg 80.1 kg    Examination:  General: lethargic, does not follow commands Chest: Clear CVs: S1, S2, no murmur, regular rhythm Abdomen: Soft, nontender Extremities: No edema Neuro: lethargic, does not follow commands, both upper extremity tremor, video EEG continuing   Data Reviewed: I have personally reviewed following labs and imaging studies  CBC: Recent Labs  Lab 07/19/24 0245 07/20/24 1120 07/21/24 1107 07/22/24 0833 07/23/24 0400  WBC 5.5 11.8* 9.4 7.0 8.6  NEUTROABS 3.8 9.6* 7.1 4.7 6.3  HGB 10.8* 12.2 11.6* 10.7* 10.7*  HCT 33.4* 37.5 36.5 33.9* 34.4*  MCV 83.1 83.0 83.9 85.4 87.3  PLT 230 280 257 209 188   Basic Metabolic Panel: Recent Labs  Lab 07/17/24 0600 07/18/24 0554 07/19/24 0245 07/20/24 1120 07/22/24 0833 07/23/24 0400  NA 140 139 136 140 150* 146*  K 4.3 4.4 4.1 4.3 3.6 3.5  CL 104 103 101 108 113* 109  CO2 27 24 19* 16* 26 25  GLUCOSE 100* 189* 236* 288* 167* 308*  BUN 9 9 11 8 15 16   CREATININE 0.61 0.56 0.76 0.90 0.77 0.68  CALCIUM  9.4 9.4 9.1 9.6 9.4 9.2  MG 1.6* 1.8 1.5* 1.4*  --  1.6*  PHOS   --   --   --   --   --  2.1*   GFR: Estimated Creatinine Clearance: 72.4 mL/min (by C-G formula based on SCr of 0.68 mg/dL). Liver Function Tests: Recent Labs  Lab 07/19/24 0245 07/22/24 0833  AST 25 19  ALT 29 18  ALKPHOS 76 57  BILITOT 1.2 0.2  PROT 7.0 6.5  ALBUMIN 3.2* 2.8*   No results for input(s): LIPASE, AMYLASE in the last 168 hours. Recent Labs  Lab 07/19/24 0245  AMMONIA <13   Coagulation Profile: No results for input(s): INR, PROTIME in the last 168 hours.  Cardiac Enzymes: No results for input(s): CKTOTAL, CKMB, CKMBINDEX, TROPONINI in the last 168 hours. BNP (last 3 results) Recent Labs    05/22/24 0630  PROBNP 3,514.0*   HbA1C: No results for input(s): HGBA1C in the last 72 hours. CBG: Recent Labs  Lab 07/22/24 1939 07/22/24 2347 07/23/24 0343 07/23/24 0740 07/23/24 1116  GLUCAP 219* 277* 315* 251* 234*   Lipid Profile: No results for input(s): CHOL, HDL, LDLCALC, TRIG, CHOLHDL, LDLDIRECT in the last 72 hours. Thyroid  Function Tests: Recent Labs    07/22/24 0833  TSH 0.315*   Anemia Panel: No results for input(s): VITAMINB12, FOLATE, FERRITIN, TIBC, IRON, RETICCTPCT  in the last 72 hours.  Sepsis Labs: No results for input(s): PROCALCITON, LATICACIDVEN in the last 168 hours.   Recent Results (from the past 240 hours)  Urine Culture     Status: None   Collection Time: 07/16/24  1:43 AM   Specimen: Urine, Random  Result Value Ref Range Status   Specimen Description   Final    URINE, RANDOM Performed at [facility], [address], [place], [place] [address]    Special Requests   Final    NONE Reflexed from [phone] Performed at [facility], [address], [place], [place] [address]    Culture   Final    NO GROWTH Performed at [facility], [address], [place], [place] [address]    Report Status 07/17/2024 FINAL  Final  Blood Culture  (routine x 2)     Status: None   Collection Time: 07/16/24  2:00 AM   Specimen: BLOOD  Result Value Ref Range Status   Specimen Description   Final    BLOOD RIGHT ANTECUBITAL Performed at [facility], [address] [place], [place] [address]    Special Requests   Final    Blood Culture adequate volume BOTTLES DRAWN AEROBIC AND ANAEROBIC Performed at [facility], [address] [place], [place] [address]    Culture   Final    NO GROWTH 5 DAYS Performed at [facility], [address], [place], [place] [address]    Report Status 07/21/2024 FINAL  Final  Blood Culture (routine x 2)     Status: None   Collection Time: 07/16/24  2:00 AM   Specimen: BLOOD  Result Value Ref Range Status   Specimen Description   Final    BLOOD LEFT ANTECUBITAL Performed at [facility], [address], [place], [place] [address]    Special Requests   Final    Blood Culture adequate volume BOTTLES DRAWN AEROBIC AND ANAEROBIC Performed at [facility], [address], [place], [place] [address]    Culture   Final    NO GROWTH 5 DAYS Performed at [facility], [address], [place], [place] [address]    Report Status 07/21/2024 FINAL  Final  Culture, blood (Routine X 2) w Reflex to ID Panel     Status: None (Preliminary result)   Collection Time: 07/21/24 11:07 AM   Specimen: BLOOD RIGHT HAND  Result Value Ref Range Status   Specimen Description BLOOD RIGHT HAND  Final   Special Requests   Final    BOTTLES DRAWN AEROBIC AND ANAEROBIC Blood Culture results may not be optimal due to an inadequate volume of blood received in culture bottles   Culture   Final    NO GROWTH 2 DAYS Performed at [facility], [address], [place], [place] [address]    Report Status PENDING  Incomplete  Culture, blood (Routine X 2) w Reflex to ID Panel     Status: None (Preliminary result)   Collection Time: 07/21/24  11:07 AM   Specimen: BLOOD LEFT ARM  Result Value Ref Range Status   Specimen Description BLOOD LEFT ARM  Final   Special Requests   Final    BOTTLES DRAWN AEROBIC AND ANAEROBIC Blood Culture adequate volume   Culture   Final    NO GROWTH 2 DAYS Performed at [facility], [address] [place], [place]  [address]    Report Status PENDING  Incomplete         Radiology Studies: DG Abd Portable 1V Result Date: 07/22/2024 CLINICAL DATA:  Feeding tube placement. EXAM: PORTABLE ABDOMEN - 1 VIEW COMPARISON:  12/08/2021. FINDINGS: Enteric tube tip overlies the distal stomach. IMPRESSION: Enteric tube tip in the distal stomach. Electronically Signed   By: Harrietta Sherry M.D.   On: 07/22/2024 11:33        Scheduled Meds:  atorvastatin   80 mg Oral Daily   Chlorhexidine  Gluconate Cloth  6 each Topical Daily   feeding supplement (PROSource TF20)  60 mL Per Tube Daily   free water  200 mL Per Tube Q6H   insulin  aspart  0-15 Units Subcutaneous Q4H   insulin  aspart  4 Units Subcutaneous Q4H   insulin  glargine-yfgn  30 Units Subcutaneous Daily   levETIRAcetam  500 mg Per Tube BID   lidocaine   1 patch Transdermal Daily   phosphorus  250 mg Per Tube BID   propranolol  20 mg Oral TID   sodium chloride  flush  10-40 mL Intracatheter Q12H   thiamine  100 mg Per Tube Daily   Continuous Infusions:  ampicillin (OMNIPEN) IV 2 g (07/23/24 0938)   feeding supplement (OSMOLITE 1.5 CAL) 45 mL/hr at 07/23/24 0300   heparin  800 Units/hr (07/23/24 0645)   meropenem (MERREM) IV 2 g (07/23/24 1454)   vancomycin  1,000 mg (07/23/24 0506)          Derryl Duval, MD [facility] 07/23/2024, 3:54 PM

## 2024-07-23 NOTE — Plan of Care (Signed)
  Problem: Education: Goal: Ability to describe self-care measures that may prevent or decrease complications (Diabetes Survival Skills Education) will improve 07/23/2024 1816 by Lynnette Cena CROME, RN Outcome: Progressing 07/23/2024 1815 by Lynnette Cena CROME, RN Outcome: Progressing Goal: Individualized Educational Video(s) Outcome: Progressing

## 2024-07-23 NOTE — Evaluation (Addendum)
 Occupational Therapy Evaluation Patient Details Name: Brenda [name] MRN: [id number] DOB: [birth year] Today's Date: 07/23/2024   History of Present Illness   63 yo female who returned to hospital on 07/15/2024 with ongoing signs and symptoms of UTI with generalized weakness. Pt with 4 seizures on EEG 11/16. Working diagnosis of cefepime  neurotoxicity. Prior admit 11/9 with urinary symptoms and AMS d/c home on OP ABX. PMH includes but is not limited to: HTN, HLD, DM, depression/anxiety, CVA (CIR recently), DVT/PE, OSA on CPAP, chronic respiratory failure on 2 L/min at baseline, and metastatic endometrial cancer with chronic cancer-related pain.     Clinical Impressions Pt received in bed, supportive daughter at bedside. Pt on cEEG during visit. Prior to November admission, pt was relatively indep with BADLs, using rollator for mobility in the home. Today, she presents obtunded, not following commands, although does awaken occasionally to multisensory stim. Wakefulness improving with HOB elevation and bed in chair position. BUE tremors with some rigidity in extremities. She requires total A for all mobility/self-care at this time. Discussed OT POC to attempt to see pt while in house and as GOC discussions are had/established. Donned b/l PRAFOs and discussed with daughter wear schedule. Bony prominences propped for pressure relief. Acute OT services are indicated to facilitate safe discharge and promote independence as best possible.     If plan is discharge home, recommend the following:   Two people to help with walking and/or transfers;Two people to help with bathing/dressing/bathroom;Assistance with cooking/housework;Assistance with feeding;Direct supervision/assist for medications management;Direct supervision/assist for financial management;Assist for transportation;Help with stairs or ramp for entrance;Supervision due to cognitive status     Functional Status Assessment   Patient has  had a recent decline in their functional status and demonstrates the ability to make significant improvements in function in a reasonable and predictable amount of time.     Equipment Recommendations   Hospital bed;Hoyer lift     Recommendations for Other Services         Precautions/Restrictions   Precautions Precautions: Fall;Other (comment) Recall of Precautions/Restrictions: Impaired Precaution/Restrictions Comments: Seizure, Cortrak, cEEG Restrictions Weight Bearing Restrictions Per Provider Order: No     Mobility Bed Mobility Overal bed mobility: Needs Assistance Bed Mobility: Rolling Rolling: Total assist, +2 for physical assistance         General bed mobility comments: rolled bilaterally for pressure relief & adjusting pads/pillows    Transfers                   General transfer comment: deferred 2/2 level of alertness/arousal      Balance               Standing balance comment: NT; suspected balance deficits 2/2 poor pt alertness/arousal level                           ADL either performed or assessed with clinical judgement   ADL Overall ADL's : Needs assistance/impaired Eating/Feeding: NPO;Total assistance Eating/Feeding Details (indicate cue type and reason): Cortrak in place Grooming: Total assistance   Upper Body Bathing: Total assistance   Lower Body Bathing: Total assistance   Upper Body Dressing : Total assistance   Lower Body Dressing: Total assistance       Toileting- Clothing Manipulation and Hygiene: Total assistance               Vision Baseline Vision/History:  (no eye glasses present during session) Additional Comments: per prior notes and  dtr - pt with L field cut from prior CVA in Sept 2025, pt not tracking visual stim this date; L pupil dilated compared to R     Perception         Praxis         Pertinent Vitals/Pain Pain Assessment Pain Assessment: Faces Faces Pain Scale: No  hurt     Extremity/Trunk Assessment Upper Extremity Assessment Upper Extremity Assessment: RUE deficits/detail;LUE deficits/detail RUE Deficits / Details: observed muscle quivering & tremors at rest and rigidity to BUE (RUE>LUE) LUE Deficits / Details: observed muscle quivering & tremors at rest and rigidity to BUE (RUE>LUE)   Lower Extremity Assessment Lower Extremity Assessment: Defer to PT evaluation   Cervical / Trunk Assessment Cervical / Trunk Assessment: Other exceptions Cervical / Trunk Exceptions: neck laterall flexed and slightly rotated to the R towards cEEG cables   Communication Communication Communication: Impaired Factors Affecting Communication: Difficulty expressing self   Cognition Arousal: Stuporous Behavior During Therapy: Flat affect Cognition: Cognition impaired   Orientation impairments: Time, Situation Awareness:  (UTA) Memory impairment (select all impairments):  (UTA) Attention impairment (select first level of impairment): Focused attention   OT - Cognition Comments: pt's wakefulness improving with bed in chair position, but unable to maintain alertness for long; responds to dtr's voice (suspect out of familiarity)                 Following commands: Impaired Following commands impaired:  (not following commands)     Cueing  General Comments   Cueing Techniques: Verbal cues;Tactile cues;Visual cues  supportive dtr present throughout, educated on [facility] application & wear schedule with wear schedule posted at [facility] for family & RN reference (q2 hr)   Exercises General Exercises - Upper Extremity Shoulder Flexion: PROM, Both, 10 reps, Supine Elbow Extension: PROM, Both, 10 reps, Supine Composite Extension: PROM, Both, 10 reps, Supine Other Exercises Other Exercises: propped with pillows to promote neutral c-spine, pt tending to lean with head laterally flexed and slightly rotated to the R   Shoulder Instructions      Home Living  Family/patient expects to be discharged to:: Private residence Living Arrangements: Spouse/significant other Available Help at Discharge: Family;Available 24 hours/day Type of Home: House Home Access: Ramped entrance     Home Layout: One level     Bathroom Shower/Tub: Chief Strategy Officer: Standard Bathroom Accessibility: Yes   Home Equipment: Rollator (4 wheels);BSC/3in1;Shower seat;Grab bars - toilet;Grab bars - tub/shower;Wheelchair - manual;Hand held shower head;Adaptive equipment Adaptive Equipment: Reacher Additional Comments: baseline 2 ltrs O2, use of rollator in home, w/c for MD appts, ramp not sufficient as husband having difficulty with mgmt, was recieving HH therapy services and was recently d/c'd CIR  Lives With: Family;Spouse (spouse does not live in home but assists and stays when son at school and dtr at her own home with her family)    Prior Functioning/Environment Prior Level of Function : Needs assist;History of Falls (last six months)       Physical Assist : Mobility (physical);ADLs (physical) Mobility (physical): Stairs ADLs (physical): Bathing;Dressing Mobility Comments: Since time of d/c from hospital most recent hospitalization 11/9 pt has been primarily bed bound, daughter indicated pt has been able to get OOB with heavy assist to wc to recliner x 1 and unable to safely navigate in home with rollator to the bathroom.  Prior to November 2025: use of rollator in home, w/c for community access with assist, having issues on ramp and for  bed mobility as well as w/c and rollator access into bathroom. ADLs Comments: family reports pt requires extensive assist for all ADLs, and self care tasks. prior to November 2025: relatively mod I prior for BADL's except showers and assist with all IADL's from family    OT Problem List: Decreased strength;Decreased activity tolerance;Impaired balance (sitting and/or standing);Impaired vision/perception;Decreased  coordination;Decreased cognition;Decreased safety awareness;Decreased knowledge of use of DME or AE;Decreased knowledge of precautions;Cardiopulmonary status limiting activity;Impaired UE functional use;Pain;Impaired tone   OT Treatment/Interventions: Self-care/ADL training;Therapeutic exercise;DME and/or AE instruction;Therapeutic activities;Patient/family education;Balance training;Cognitive remediation/compensation;Energy conservation;Visual/perceptual remediation/compensation      OT Goals(Current goals can be found in the care plan section)   Acute Rehab OT Goals Patient Stated Goal: pt unable to state OT Goal Formulation: With patient/family Time For Goal Achievement: 08/06/24 Potential to Achieve Goals: Fair   OT Frequency:  Min 1X/week    Co-evaluation PT/OT/SLP Co-Evaluation/Treatment: Yes Reason for Co-Treatment: To address functional/ADL transfers;Necessary to address cognition/behavior during functional activity;Complexity of the patient's impairments (multi-system involvement) PT goals addressed during session: Mobility/safety with mobility;Strengthening/ROM OT goals addressed during session: ADL's and self-care;Strengthening/ROM      AM-PAC OT 6 Clicks Daily Activity     Outcome Measure Help from another person eating meals?: Total Help from another person taking care of personal grooming?: Total Help from another person toileting, which includes using toliet, bedpan, or urinal?: Total Help from another person bathing (including washing, rinsing, drying)?: Total Help from another person to put on and taking off regular upper body clothing?: Total Help from another person to put on and taking off regular lower body clothing?: Total 6 Click Score: 6   End of Session Nurse Communication: Other (comment) (pt status, application of PRAFOs)  Activity Tolerance: Patient limited by fatigue;Other (comment) (poor alertness) Patient left: in bed;with call bell/phone within  reach;with bed alarm set;with family/visitor present (PRAFOs donned, bed in chair position)  OT Visit Diagnosis: Muscle weakness (generalized) (M62.81);Low vision, both eyes (H54.2);Feeding difficulties (R63.3);Cognitive communication deficit (R41.841) Symptoms and signs involving cognitive functions: Cerebral infarction                Time: 8993-8965 OT Time Calculation (min): 28 min Charges:  OT General Charges $OT Visit: 1 Visit OT Evaluation $OT Eval Low Complexity: 1 Low OT Treatments $Therapeutic Activity: 8-22 mins  Kitt Ledet M. [place], OTR/L [facility] Acute Rehabilitation Services [phone] Secure Chat Preferred  Rikki Burma 07/23/2024, 2:20 PM

## 2024-07-23 NOTE — Telephone Encounter (Signed)
 Called per Dr. Lonn, appt canceled appt for tomorrow due to being in the hospital. Told her the office will call for appt once Joye is d/ced. Daughter verbalized understanding.

## 2024-07-23 NOTE — Progress Notes (Addendum)
 NEUROLOGY CONSULT FOLLOW UP NOTE   Date of service: July 23, 2024 Patient Name: Brenda [name] MRN:  [id number] DOB:  06-16-1961  Interval Hx/subjective   No seizures overnight, LTM discontinued Patient opened her eyes and attempted to speak to husband (slurred, but appropriate term of endearment) No new neurologic concerns  Vitals   Vitals:   07/23/24 0736 07/23/24 1152 07/23/24 1541 07/23/24 2011  BP: (!) 161/71 (!) 135/58 120/61 (!) 157/68  Pulse: 94 80 83 77  Resp: 18 18 19 18   Temp: 99 F (37.2 C) 98.2 F (36.8 C) 99.8 F (37.7 C) 99.9 F (37.7 C)  TempSrc: Oral Oral Oral   SpO2: 100% 100% 100% (!) 88%  Weight:      Height:         Body mass index is 32.3 kg/m.  Physical Exam   Constitutional: Chronically ill female Eyes: No scleral injection.   HENT: No OP obstrucion.   Head: Normocephalic.   Cardiovascular: Normal rate and regular rhythm.   Respiratory: Effort normal, non-labored breathing.   GI: Soft.  No distension. There is no tenderness.   Skin: WDI.    Neurologic Examination   Mental Status -  Patient is drowsy but will moan and move her head to stimulation but does not open her eyes spontaneously or follow commands.  Very little speech output  Cranial Nerves II - XII - II - Visual field no blink to threat bilaterally.  husband says patient is almost blind with very poor vision III, IV, VI -doll's eyes intact, eyes are midline intact corneal reflex bilaterally V - Facial sensation intact bilaterally. VII - Facial movement intact bilaterally. VIII - Hearing & vestibular intact bilaterally. X - Palate elevates symmetrically. XI - Chin turning & shoulder shrug intact bilaterally. XII -unable to assess  Motor Strength -no spontaneous movement seen, withdraws in all 4 extremities to painful stimuli bulk was normal and fasciculations were absent.   Motor Tone - Muscle tone was assessed at the neck and appendages and was normal. Sensory  -response to noxious stimuli Coordination -unable to assess  Gait and Station - deferred.  Medications  Current Facility-Administered Medications:    acetaminophen  (TYLENOL ) tablet 650 mg, 650 mg, Oral, Q6H PRN **OR** acetaminophen  (TYLENOL ) suppository 650 mg, 650 mg, Rectal, Q6H PRN, Foust, Katy L, NP, 650 mg at 07/22/24 0013   ampicillin (OMNIPEN) 2 g in sodium chloride  0.9 % 100 mL IVPB, 2 g, Intravenous, Q6H, Sigdel, Santosh, MD, Last Rate: 300 mL/hr at 07/23/24 1659, 2 g at 07/23/24 1659   atorvastatin  (LIPITOR ) tablet 80 mg, 80 mg, Oral, Daily, Chen, Lydia D, RPH, 80 mg at 07/23/24 9171   Chlorhexidine  Gluconate Cloth 2 % PADS 6 each, 6 each, Topical, Daily, Chavez, Abigail, NP, 6 each at 07/23/24 0830   feeding supplement (OSMOLITE 1.5 CAL) liquid 1,000 mL, 1,000 mL, Per Tube, Continuous, Sigdel, Santosh, MD, Last Rate: 45 mL/hr at 07/23/24 0300, Rate Change at 07/23/24 0300   feeding supplement (PROSource TF20) liquid 60 mL, 60 mL, Per Tube, Daily, Sigdel, Santosh, MD, 60 mL at 07/23/24 0829   fentaNYL  (SUBLIMAZE ) injection 25-50 mcg, 25-50 mcg, Intravenous, Q2H PRN, Opyd, Timothy S, MD, 50 mcg at 07/23/24 1700   free water 200 mL, 200 mL, Per Tube, Q6H, Sigdel, Santosh, MD, 200 mL at 07/23/24 1700   haloperidol lactate (HALDOL) injection 2 mg, 2 mg, Intravenous, Q6H PRN, Cheryle, Kshitiz, MD, 2 mg at 07/19/24 0142   heparin  ADULT infusion 100 units/mL (  25000 units/223mL), 800 Units/hr, Intravenous, Continuous, Mattie Marvetta SQUIBB, RPH, Last Rate: 8 mL/hr at 07/23/24 0645, 800 Units/hr at 07/23/24 0645   insulin  aspart (novoLOG ) injection 0-15 Units, 0-15 Units, Subcutaneous, Q4H, Sigdel, Santosh, MD, 11 Units at 07/23/24 2031   insulin  aspart (novoLOG ) injection 4 Units, 4 Units, Subcutaneous, Q4H, Sigdel, Santosh, MD, 4 Units at 07/23/24 2031   insulin  glargine-yfgn (SEMGLEE ) injection 30 Units, 30 Units, Subcutaneous, Daily, Sigdel, Santosh, MD, 30 Units at 07/23/24 0938   labetalol   (NORMODYNE ) injection 10 mg, 10 mg, Intravenous, Q2H PRN, Sigdel, Santosh, MD, 10 mg at 07/22/24 0746   levETIRAcetam (KEPPRA) tablet 500 mg, 500 mg, Per Tube, BID, Laurence Jinnie BIRCH, RPH, 500 mg at 07/23/24 2130   lidocaine  (LIDODERM ) 5 % 1 patch, 1 patch, Transdermal, Daily, Alekh, Kshitiz, MD, 1 patch at 07/23/24 0825   meropenem (MERREM) 2 g in sodium chloride  0.9 % 100 mL IVPB, 2 g, Intravenous, Q8H, Laurence Jinnie BIRCH, RPH, Last Rate: 280 mL/hr at 07/23/24 2148, 2 g at 07/23/24 2148   ondansetron  (ZOFRAN ) tablet 4 mg, 4 mg, Oral, Q6H PRN **OR** ondansetron  (ZOFRAN ) injection 4 mg, 4 mg, Intravenous, Q6H PRN, Foust, Katy L, NP   oxyCODONE  (Oxy IR/ROXICODONE ) immediate release tablet 5 mg, 5 mg, Oral, Q6H PRN, Cheryle, Kshitiz, MD, 5 mg at 07/22/24 1429   phosphorus (K PHOS NEUTRAL) tablet 250 mg, 250 mg, Per Tube, BID, Sigdel, Santosh, MD, 250 mg at 07/23/24 2129   prochlorperazine  (COMPAZINE ) tablet 10 mg, 10 mg, Oral, Q6H PRN, Foust, Katy L, NP   propranolol (INDERAL) tablet 20 mg, 20 mg, Per Tube, TID, Sigdel, Santosh, MD, 20 mg at 07/23/24 2148   senna-docusate (Senokot-S) tablet 1 tablet, 1 tablet, Oral, QHS PRN, Foust, Katy L, NP   sodium chloride  flush (NS) 0.9 % injection 10-40 mL, 10-40 mL, Intracatheter, Q12H, Sigdel, Santosh, MD, 10 mL at 07/23/24 2129   sodium chloride  flush (NS) 0.9 % injection 10-40 mL, 10-40 mL, Intracatheter, PRN, Sigdel, Santosh, MD   thiamine (VITAMIN B1) tablet 100 mg, 100 mg, Per Tube, Daily, Sigdel, Santosh, MD, 100 mg at 07/23/24 0828   vancomycin  (VANCOCIN ) IVPB 1000 mg/200 mL premix, 1,000 mg, Intravenous, Q12H, Billy Rocky SAUNDERS, RPH, Last Rate: 200 mL/hr at 07/23/24 1741, 1,000 mg at 07/23/24 1741  Labs and Diagnostic Imaging   CBC:  Recent Labs  Lab 07/22/24 0833 07/23/24 0400  WBC 7.0 8.6  NEUTROABS 4.7 6.3  HGB 10.7* 10.7*  HCT 33.9* 34.4*  MCV 85.4 87.3  PLT 209 188    Basic Metabolic Panel:  Lab Results  Component Value Date   NA 146 (H)  07/23/2024   K 3.5 07/23/2024   CO2 25 07/23/2024   GLUCOSE 308 (H) 07/23/2024   BUN 16 07/23/2024   CREATININE 0.68 07/23/2024   CALCIUM  9.2 07/23/2024   GFRNONAA >60 07/23/2024   GFRAA 94 12/16/2019   Lipid Panel:  Lab Results  Component Value Date   LDLCALC 28 05/30/2024   HgbA1c:  Lab Results  Component Value Date   HGBA1C 7.2 (H) 06/01/2024   Urine Drug Screen:     Component Value Date/Time   LABOPIA POSITIVE (A) 04/20/2024 1817   COCAINSCRNUR NONE DETECTED 04/20/2024 1817   LABBENZ NONE DETECTED 04/20/2024 1817   AMPHETMU NONE DETECTED 04/20/2024 1817   THCU NONE DETECTED 04/20/2024 1817   LABBARB NONE DETECTED 04/20/2024 1817    Alcohol Level     Component Value Date/Time   ETH <15 04/20/2024 1344  INR  Lab Results  Component Value Date   INR 1.0 07/16/2024   APTT  Lab Results  Component Value Date   APTT 67 (H) 06/21/2024   AED levels: No results found for: PHENYTOIN, ZONISAMIDE, LAMOTRIGINE, LEVETIRACETA  CT Head without contrast 1. Chronic encephalomalacia changes in the right medial temporal and occipital lobes developed in the interim. 2. Unchanged small calcified meningioma along left frontal convexity.   MRI Brain 1. Similar evolving infarcts in the anterior and posterior circulation. 2. No new infarcts, progressive mass effect or acute hemorrhage.   LTM EEG 11/16-11/17:   ABNORMALITY - Seizures, generalized - Continuous slow, generalized   IMPRESSION: This study showed seizures with generalized onset during which patient was noted to bilateral upper extremity jerking.  4 seizures were noted on 07/19/2024 at 0820, 1305, 1600 and 1922 lasting between 30 minutes to 1.5 hours.     Additionally EEG showed generalized periodic discharges with triphasic morphology with fluctuating frequency of 2 to 3 Hz.  This EEG pattern is on the ictal-interictal continuum with high suspicion for ictal nature.   Lastly there is generalized cerebral  dysfunction (encephalopathy).  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   LTM EEG 11/17-11/18: ABNORMALITY - Periodic discharges with triphasic morphology, generalized - Continuous slow, generalized   IMPRESSION: This study showed generalized periodic discharges with triphasic morphology with fluctuating frequency of 2 to 2.5 Hz. This EEG pattern is on the ictal-interictal continuum with high suspicion for ictal nature.   Additionally there is generalized cerebral dysfunction (encephalopathy).     LTM EEG 11/18-11/19: ABNORMALITY - Periodic discharges with triphasic morphology, generalized - Continuous slow, generalized   IMPRESSION: This study showed generalized periodic discharges ( GPDs) with triphasic morphology. At the beginning of the study, the frequency of GPDs was 2-2.5Hz  which gradually improved to 1-2hz . This EEG pattern is on the ictal-interictal continuum.    Additionally there is generalized cerebral dysfunction (encephalopathy).      EEG appears to be improving compared to previous day.  Assessment   Krystianna Soth is a 63 y.o. 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 She subsequently continued to decline, becoming increasingly encephalopathic. She was subsequently transferred to [facility] for cEEG.  Initial LTM EEG recording showed 4 electrographic seizures on 07/19/24 with generalized onset lasting 30-90 min and associated with BUE jerking. Additionally EEG showed generalized periodic discharges with triphasic morphology with fluctuating frequency of 2 to 3 Hz.  This EEG pattern is on the ictal-interictal continuum with high suspicion for ictal nature.  Clinical and electrographic seizures were favored to be driven primarily by  cefepime  toxicity however given her prior (recent) stroke, known meningioma, and recurrent UTIs her risk of recurrent seizure is high enough to justify continuation of AED at hospital discharge (and indefinitely). Electrographic seizures captured 11/16 cannot be attributed to injury from prior strokes or cortical irritation from known meningioma because onset was generalized, not focal. Of note, husband is concerned that she actually had a seizure prior to admission bc a similar shaking episode is what caused him to call 911.   She hs been seizure-free on keppra and off cefepime  now for 96 hours. Her EEG background has improved but not her mental status. Given profound continued encephalopathy (not only  2/2 prn fentanyl ) I think it would be prudent to r/o concurrent meningitis.   I had an extensive conversation with husband tonight at bedside about these concerns including diagnostic evaluation and treatment plan (we meet daily at bedside for updates). He is in agreement with recommendations below.  Recommendations   - Continue keppra 500mg  bid - D/c LTM EEG - Hold therapeutic lovenox  and transition to heparin  gtt in anticipation of LP Friday. Stop heparin  gtt at midnight on 11/20 (Thurs night). Pharmacy consult placed. - Continue current regimen vanc/ampicillin/meropenem to cover known UTI and also empiric CNS coverage for bacterial meningitis - Per family request, will order IR LP for Friday AM without prior bedside attempt. Patient has stage IV metastatic endometrial cancer with severe associated chronic pain and they want to minimize further discomfort in any way possible. Palliative care is also following. - CSF labs: ME panel, protein and glucose, cell count x2 tubes, culture and gram stain. No indication for cytology - leptomeningeal carcinomatosis is extremely rare in the setting of endometrial carcinoma.  - please administer her prn fentanyl  prior to procedure tmrw - Will continue to  follow  [name], MD [facility] [phone]  If 7pm- 7am, please page neurology on call as listed in AMION.

## 2024-07-23 NOTE — Progress Notes (Incomplete)
 PHARMACY - ANTICOAGULATION CONSULT NOTE  Pharmacy Consult for Lovenox  > Heparin  Indication: History of DVT/PE  Allergies  Allergen Reactions   Hydrocodone Shortness Of Breath, Dermatitis and Other (See Comments)    I forget to breathe.   Clopidogrel Hives and Itching    Patient Measurements: Height: 5' 2 (157.5 cm) Weight: 80.1 kg (176 lb 9.4 oz) IBW/kg (Calculated) : 50.1 HEPARIN  DW (KG): 67.2  Vital Signs: Temp: 98.9 F (37.2 C) (11/20 0342) Temp Source: Oral (11/20 0342) BP: 171/73 (11/20 0342) Pulse Rate: 85 (11/20 0342)  Labs: Recent Labs    07/20/24 1120 07/21/24 1107 07/22/24 0833 07/23/24 0400  HGB 12.2 11.6* 10.7* 10.7*  HCT 37.5 36.5 33.9* 34.4*  PLT 280 257 209 188  HEPARINUNFRC  --   --   --  >1.10*  CREATININE 0.90  --  0.77 0.68    Estimated Creatinine Clearance: 72.4 mL/min (by C-G formula based on SCr of 0.68 mg/dL).   Medical History: Past Medical History:  Diagnosis Date   Anemia    Arthritis    back, hands, hips (02/22/2015)   CAD (coronary artery disease)    a. complex LAD/diagonal bifurcation PCI in 2010. b. STEMI 10/2019 s/p PTCA/DES x1 to mLAD overlapping the old stent, residual disease treated medicaly.   [facility])    uterine   CHF (congestive heart failure) (HCC)    Depression    Diabetes mellitus (HCC)    started when I was pregnant; not sure if it was type 1 or type 2    History of radiation therapy    Endometrium- HDR 01/22/22-03/07/22- Dr. Lynwood Nasuti   Hypercholesterolemia    Hypertension    Morbid obesity (HCC)    Myocardial infarction (HCC)    mild x 3   Neuromuscular disorder (HCC)    neuropathy feet   Sleep apnea    cpap/      Assessment: 63 yo female with endometrial cancer on chronic lovenox  for hx DVT/PE to undergo LP, therefore, Pharmacy consulted to transition to heparin  drip.    Heparin  level *** is ***therapeutic on 800 units/hr.   Goal of Therapy:  Heparin  level 0.3-0.7 units/ml Monitor  platelets by anticoagulation protocol: Yes   Plan:  Heparin  infusion 800 units/hr  To stop 11/20 at 2359 for LP on Fri 11/21  F/u anticoagulation resumption post procedure  Jinnie Door, PharmD, BCPS, [facility] Clinical Pharmacist  Please check AMION for all [facility] Pharmacy phone numbers After 10:00 PM, call Main Pharmacy [phone]

## 2024-07-23 NOTE — Progress Notes (Signed)
 Daily Progress Note   Date: 07/23/2024   Patient Name: Brenda [name]  DOB: [birth year]  MRN: [id number]  Age / Sex: 63 y.o., female  Attending Physician: Mcarthur Pick, MD Primary Care Physician: [facility] Admit Date: 07/15/2024 Length of Stay: 6 days  Reason for Follow-up: Establishing goals of care  Past Medical History:  Diagnosis Date  . Anemia   . Arthritis    back, hands, hips (02/22/2015)  . CAD (coronary artery disease)    a. complex LAD/diagonal bifurcation PCI in 2010. b. STEMI 10/2019 s/p PTCA/DES x1 to mLAD overlapping the old stent, residual disease treated medicaly.  . Cancer (HCC)    uterine  . CHF (congestive heart failure) (HCC)   . Depression   . Diabetes mellitus (HCC)    started when I was pregnant; not sure if it was type 1 or type 2   . History of radiation therapy    Endometrium- HDR 01/22/22-03/07/22- Dr. Lynwood Nasuti  . Hypercholesterolemia   . Hypertension   . Morbid obesity (HCC)   . Myocardial infarction (HCC)    mild x 3  . Neuromuscular disorder (HCC)    neuropathy feet  . Sleep apnea    cpap/    Assessment & Plan:   HPI/Patient Profile:  63 y.o. 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   Palliative medicine consulted for goals of care conversation.  SUMMARY OF RECOMMENDATIONS DNR-limited Open to all aggressive medical interventions including temporary artificial feeding tubes, family clear patient would not want to be prolonged interventions if she is unable to have a meaningful recovery Continue to monitor for improvement or deterioration for further discussion points with family  Symptom Management:  Per primary team  Code Status: DNR - Limited (DNR/DNI)  Prognosis: Unable to determine  Discharge Planning: To Be  Determined  Discussed with: Sigdel MD about palliative team continuing to follow along and monitor for progress or deterioration.   Subjective:   Subjective: Chart Reviewed. Updates received. Patient Assessed. Created space and opportunity for patient  and family to explore thoughts and feelings regarding current medical situation.  Today's Discussion: Today before meeting with the patient/family, I reviewed the chart notes including neurology EEG note on 11/20. I also reviewed vital signs, nursing flowsheets, medication administrations record, labs, and imaging.  Per neurology EEG note on 11/20 by Shelton MD - generalized encephalopathy and no epileptiform discharges noted.   Tube feeds were started 11/19. No changes in patient's mental status compared to prior visits. Remains obtunded with continued upper BLE tremors with occasional elevation of arms to head. Daughters were bedside and all questions were answered. Daughters requested some education material to help their younger brother and the patient's grandson process serious illness and end of life discussion. Plan to reach out to social work for resources.   Review of Systems  Unable to perform ROS   Objective:   Primary Diagnoses: Present on Admission: . UTI (urinary tract infection) . Chronic hypoxic respiratory failure (HCC) . Anxiety with depression . CAD (coronary artery disease) . 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 DNR (do not resuscitate) . Endometrial carcinoma (HCC) . Obstructive sleep apnea . Acute metabolic encephalopathy   Vital Signs:  BP (!) 135/58 (BP Location: Right Arm)   Pulse 80   Temp 98.2 F (36.8 C) (Oral)   Resp 18  Ht 5' 2 (1.575 m)   Wt 80.1 kg   LMP 10/30/2014 (Approximate)   SpO2 100%   BMI 32.30 kg/m   Physical Exam Constitutional:      Appearance: She is ill-appearing.  HENT:     Head: Normocephalic.     Mouth/Throat:     Mouth: Mucous membranes are dry.   Cardiovascular:     Pulses: Normal pulses.  Pulmonary:     Effort: Pulmonary effort is normal.  Abdominal:     Palpations: Abdomen is soft.    Palliative Assessment/Data: 10%   Existing Vynca/ACP Documentation: None  Thank you for allowing us  to participate in the care of Brenda [name] PMT will continue to support holistically.  I personally spent a total of 35 minutes in the care of the patient today including preparing to see the patient, getting/reviewing separately obtained history, performing a medically appropriate exam/evaluation, counseling and educating, referring and communicating with other health care professionals, and documenting clinical information in the EHR.  Brenda [name] Brenda [name]  Palliative Medicine Team  Team Phone # [phone] (Nights/Weekends) 07/23/2024 12:16 PM

## 2024-07-23 NOTE — Progress Notes (Incomplete)
 PROGRESS NOTE    Brenda [name]  FMW:[id number] DOB: Dec 20, 1960 DOA: 07/15/2024 PCP: [facility]   Brief Narrative:  63 year old female with history of metastatic endometrial cancer, chronic cancer-related pain, hypertension, hyperlipidemia, diabetes mellitus, depression, anxiety, CVA, history of DVT and PE anticoagulated on Lovenox , OSA, chronic hypoxic respiratory failure on 2L via nasal cannula and recent hospitalization and discharged on 07/14/2024 for UTI treated with IV antibiotics followed by oral antibiotics on discharge presented with chills and weakness.  She was found to have UTI and was started on IV cefepime .  Unfortunately mental status worsened and she was transferred to [facility] for continuous EEG monitoring.  Assessment & Plan:  Acute metabolic encephalopathy Subclinical seizures -Patient has had intermittent confusion since admission and has been slow to respond to secondary to UTI.  However on 11/15 CT head worsening status with the stiffening of upper extremities, patient transferred to [facility] for continuous video EEG.  MRI brain on 11/15 is negative for acute findings.  Shows evolution of ischemic infarcts similar to before.   IV cefepime  could be culprit and this was discontinued. Husband today says when he called 911 pt was having similar symptoms of bilateral upper extremities movement and change in mentation Ongoing Continuous video EEG 4 seizures noted on 11/16, keppra given 11/16 night. Will continue on keppra 500 bid per neurology  UTI: Present on admission, failed p.o. antibiotics - Blood and urine cultures negative so far.  Continue IV meropenem (start 11/15. Will complete for 7 days  Low-grade temperature: Port site appears normal.  Mild leukocytosis, will follow blood cultures.    Endometrial cancer with metastases Chronic cancer-related pain with opiate dependence.  Will continue fentanyl , oxy as needed for pain. Fentanyl  patch could be  an option.  Will defer to palliative care. - Outpatient follow-up with oncology/Dr. Lonn.  Continue current pain management. - family requested second opinion at [facility].  Referral will be sent for outpatient  Hypomagnesemia, replace as needed.  Hyperthyroidism, TSH is 0.23,  free T4 level elevated 1.4.  Mild suppression of TSH.  Likely from ongoing illness.  Antithyroid medications are not indication at this time.  Will add propranolol to help with tremors.  Patient additionally hypertensive and tachycardic.  Chronic respiratory failure with hypoxia OSA - Respiratory status stable.  Continue supplemental oxygen .  Continue CPAP at night  History of PE and DVT - Continue Lovenox   Diabetes mellitus type 2 with intermittent hypoglycemia -Will continue Lantus  to 20 units daily and continue with sliding scale insulin  coverage.  Hypomagnesemia - Resolved  Hypernatremia: Na 150. Will start NG feeding, Free water flushes 200q6h. Am labs  Anemia of chronic disease - From cancer.  Hemoglobin stable.  No signs of bleeding.  Monitor intermittently  Depression/anxiety - Prozac , Remeron, Haldol discontinued as discussed above.    History of CAD History of stroke Hyperlipidemia -Continue statin.  Not on aspirin  since she is on treatment dose Lovenox .  Obesity class I - Outpatient follow-up  Physical deconditioning - Likely will need SNF pending her clinical course.  Nutrition: On IV hydration.  Plan for cortrack.  Palliative care following  DVT prophylaxis: Lovenox  therapeutic dosing Code Status: DNR Family Communication: daughter at the bedside disposition Plan: Status is:  inpatient because: Severity of illness    Consultants: Neuro  Procedures: None  Antimicrobials: Cefepime  from 07/16/2024 -11/14, Meropenem 11/15-   Subjective:  Patient seen and examined at the bedside.  Daughter is present.  Patient remains lethargic.  She was  able to open eyes with verbal command.   Vital signs have been stable.  She is borderline tachycardic and hypertensive.   Objective: Vitals:   07/22/24 2344 07/23/24 0342 07/23/24 0736 07/23/24 1152  BP: (!) 163/60 (!) 171/73 (!) 161/71 (!) 135/58  Pulse: 79 85 94 80  Resp:   18 18  Temp: 98.6 F (37 C) 98.9 F (37.2 C) 99 F (37.2 C) 98.2 F (36.8 C)  TempSrc: Oral Oral Oral Oral  SpO2: 100% 100% 100% 100%  Weight:      Height:        Intake/Output Summary (Last 24 hours) at 07/23/2024 1343 Last data filed at 07/23/2024 1221 Gross per 24 hour  Intake 3530.5 ml  Output --  Net 3530.5 ml   Filed Weights   07/20/24 0500 07/21/24 0500 07/22/24 0500  Weight: 81.8 kg 80.2 kg 80.1 kg    Examination:  General: lethargic, does not follow commands Chest: Clear CVs: S1, S2, no murmur, regular rhythm Abdomen: Soft, nontender Extremities: No edema Neuro: lethargic, does not follow commands, both upper extremity tremor, video EEG continuing   Data Reviewed: I have personally reviewed following labs and imaging studies  CBC: Recent Labs  Lab 07/19/24 0245 07/20/24 1120 07/21/24 1107 07/22/24 0833 07/23/24 0400  WBC 5.5 11.8* 9.4 7.0 8.6  NEUTROABS 3.8 9.6* 7.1 4.7 6.3  HGB 10.8* 12.2 11.6* 10.7* 10.7*  HCT 33.4* 37.5 36.5 33.9* 34.4*  MCV 83.1 83.0 83.9 85.4 87.3  PLT 230 280 257 209 188   Basic Metabolic Panel: Recent Labs  Lab 07/17/24 0600 07/18/24 0554 07/19/24 0245 07/20/24 1120 07/22/24 0833 07/23/24 0400  NA 140 139 136 140 150* 146*  K 4.3 4.4 4.1 4.3 3.6 3.5  CL 104 103 101 108 113* 109  CO2 27 24 19* 16* 26 25  GLUCOSE 100* 189* 236* 288* 167* 308*  BUN 9 9 11 8 15 16   CREATININE 0.61 0.56 0.76 0.90 0.77 0.68  CALCIUM  9.4 9.4 9.1 9.6 9.4 9.2  MG 1.6* 1.8 1.5* 1.4*  --  1.6*  PHOS  --   --   --   --   --  2.1*   GFR: Estimated Creatinine Clearance: 72.4 mL/min (by C-G formula based on SCr of 0.68 mg/dL). Liver Function Tests: Recent Labs  Lab 07/19/24 0245 07/22/24 0833  AST 25  19  ALT 29 18  ALKPHOS 76 57  BILITOT 1.2 0.2  PROT 7.0 6.5  ALBUMIN 3.2* 2.8*   No results for input(s): LIPASE, AMYLASE in the last 168 hours. Recent Labs  Lab 07/19/24 0245  AMMONIA <13   Coagulation Profile: No results for input(s): INR, PROTIME in the last 168 hours.  Cardiac Enzymes: No results for input(s): CKTOTAL, CKMB, CKMBINDEX, TROPONINI in the last 168 hours. BNP (last 3 results) Recent Labs    05/22/24 0630  PROBNP 3,514.0*   HbA1C: No results for input(s): HGBA1C in the last 72 hours. CBG: Recent Labs  Lab 07/22/24 1939 07/22/24 2347 07/23/24 0343 07/23/24 0740 07/23/24 1116  GLUCAP 219* 277* 315* 251* 234*   Lipid Profile: No results for input(s): CHOL, HDL, LDLCALC, TRIG, CHOLHDL, LDLDIRECT in the last 72 hours. Thyroid  Function Tests: Recent Labs    07/22/24 0833  TSH 0.315*   Anemia Panel: No results for input(s): VITAMINB12, FOLATE, FERRITIN, TIBC, IRON, RETICCTPCT in the last 72 hours.  Sepsis Labs: No results for input(s): PROCALCITON, LATICACIDVEN in the last 168 hours.   Recent Results (from the  past 240 hours)  Urine Culture     Status: None   Collection Time: 07/16/24  1:43 AM   Specimen: Urine, Random  Result Value Ref Range Status   Specimen Description   Final    URINE, RANDOM Performed at [facility], [address], [place], [place] [address]    Special Requests   Final    NONE Reflexed from [phone] Performed at [facility], [address], [place], [place] [address]    Culture   Final    NO GROWTH Performed at [facility], [address], [place], [place] [address]    Report Status 07/17/2024 FINAL  Final  Blood Culture (routine x 2)     Status: None   Collection Time: 07/16/24  2:00 AM   Specimen: BLOOD  Result Value Ref Range Status   Specimen Description   Final    BLOOD RIGHT ANTECUBITAL Performed at [facility], [address], [place], [place] [address]    Special Requests   Final    Blood Culture adequate volume BOTTLES DRAWN AEROBIC AND ANAEROBIC Performed at [facility], [address], [place], [place] [address]    Culture   Final    NO GROWTH 5 DAYS Performed at [facility], [address], [place], [place] [address]    Report Status 07/21/2024 FINAL  Final  Blood Culture (routine x 2)     Status: None   Collection Time: 07/16/24  2:00 AM   Specimen: BLOOD  Result Value Ref Range Status   Specimen Description   Final    BLOOD LEFT ANTECUBITAL Performed at [facility], [address] [place], [place] [address]    Special Requests   Final    Blood Culture adequate volume BOTTLES DRAWN AEROBIC AND ANAEROBIC Performed at [facility], [address], [place], [place] [address]    Culture   Final    NO GROWTH 5 DAYS Performed at [facility], [address] [place], [place] [address]    Report Status 07/21/2024 FINAL  Final  Culture, blood (Routine X 2) w Reflex to ID Panel     Status: None (Preliminary result)   Collection Time: 07/21/24 11:07 AM   Specimen: BLOOD RIGHT HAND  Result Value Ref Range Status   Specimen Description BLOOD RIGHT HAND  Final   Special Requests   Final    BOTTLES DRAWN AEROBIC AND ANAEROBIC Blood Culture results may not be optimal due to an inadequate volume of blood received in culture bottles   Culture   Final    NO GROWTH 2 DAYS Performed at [facility], [address], [place], [place] [address]    Report Status PENDING  Incomplete  Culture, blood (Routine X 2) w Reflex to ID Panel     Status: None (Preliminary result)   Collection Time: 07/21/24 11:07 AM   Specimen: BLOOD LEFT ARM  Result Value Ref Range Status   Specimen Description BLOOD LEFT ARM  Final   Special Requests   Final    BOTTLES DRAWN AEROBIC AND ANAEROBIC Blood Culture adequate  volume   Culture   Final    NO GROWTH 2 DAYS Performed at [facility], [address] [place], [place] [address]    Report Status PENDING  Incomplete         Radiology Studies: DG Abd Portable 1V Result Date:  07/22/2024 CLINICAL DATA:  Feeding tube placement. EXAM: PORTABLE ABDOMEN - 1 VIEW COMPARISON:  12/08/2021. FINDINGS: Enteric tube tip overlies the distal stomach. IMPRESSION: Enteric tube tip in the distal stomach. Electronically Signed   By: Harrietta Sherry M.D.   On: 07/22/2024 11:33        Scheduled Meds:  atorvastatin   80 mg Oral Daily   Chlorhexidine  Gluconate Cloth  6 each Topical Daily   feeding supplement (PROSource TF20)  60 mL Per Tube Daily   free water  200 mL Per Tube Q6H   insulin  aspart  0-15 Units Subcutaneous Q4H   insulin  aspart  4 Units Subcutaneous Q4H   insulin  glargine-yfgn  30 Units Subcutaneous Daily   levETIRAcetam  500 mg Per Tube BID   lidocaine   1 patch Transdermal Daily   phosphorus  250 mg Per Tube BID   propranolol  20 mg Oral TID   sodium chloride  flush  10-40 mL Intracatheter Q12H   thiamine  100 mg Per Tube Daily   Continuous Infusions:  ampicillin (OMNIPEN) IV 2 g (07/23/24 0938)   feeding supplement (OSMOLITE 1.5 CAL) 45 mL/hr at 07/23/24 0300   heparin  800 Units/hr (07/23/24 0645)   meropenem (MERREM) IV 2 g (07/23/24 0607)   vancomycin  1,000 mg (07/23/24 0506)          Derryl Duval, MD [facility] 07/23/2024, 1:43 PM

## 2024-07-23 NOTE — Progress Notes (Signed)
 PHARMACY - ANTICOAGULATION CONSULT NOTE  Pharmacy Consult for heparin  Indication: h/o VTE  Labs: Recent Labs    07/20/24 1120 07/21/24 1107 07/22/24 0833 07/23/24 0400  HGB 12.2 11.6* 10.7*  --   HCT 37.5 36.5 33.9*  --   PLT 280 257 209  --   HEPARINUNFRC  --   --   --  >1.10*  CREATININE 0.90  --  0.77 0.68   Assessment: 63yo female supratherapeutic on heparin  with initial dosing while LMWH on hold; no infusion issues or signs of bleeding per RN.  Goal of Therapy:  Heparin  level 0.3-0.7 units/ml   Plan:  Hold heparin  infusion x1h. Decrease heparin  infusion to 800 units/hr. Check level in 6 hours.   Marvetta Dauphin, PharmD, BCPS 07/23/2024 5:50 AM

## 2024-07-24 ENCOUNTER — Inpatient Hospital Stay: Admitting: Hematology and Oncology

## 2024-07-24 ENCOUNTER — Inpatient Hospital Stay (HOSPITAL_COMMUNITY)

## 2024-07-24 DIAGNOSIS — R569 Unspecified convulsions: Secondary | ICD-10-CM | POA: Diagnosis not present

## 2024-07-24 DIAGNOSIS — Z66 Do not resuscitate: Secondary | ICD-10-CM | POA: Diagnosis not present

## 2024-07-24 DIAGNOSIS — G9341 Metabolic encephalopathy: Secondary | ICD-10-CM | POA: Diagnosis not present

## 2024-07-24 DIAGNOSIS — Z7189 Other specified counseling: Secondary | ICD-10-CM | POA: Diagnosis not present

## 2024-07-24 DIAGNOSIS — R4182 Altered mental status, unspecified: Secondary | ICD-10-CM | POA: Diagnosis not present

## 2024-07-24 DIAGNOSIS — E440 Moderate protein-calorie malnutrition: Secondary | ICD-10-CM | POA: Insufficient documentation

## 2024-07-24 DIAGNOSIS — G893 Neoplasm related pain (acute) (chronic): Secondary | ICD-10-CM | POA: Diagnosis not present

## 2024-07-24 DIAGNOSIS — Z515 Encounter for palliative care: Secondary | ICD-10-CM | POA: Diagnosis not present

## 2024-07-24 LAB — GLUCOSE, CAPILLARY
Glucose-Capillary: 101 mg/dL — ABNORMAL HIGH (ref 70–99)
Glucose-Capillary: 117 mg/dL — ABNORMAL HIGH (ref 70–99)
Glucose-Capillary: 226 mg/dL — ABNORMAL HIGH (ref 70–99)
Glucose-Capillary: 250 mg/dL — ABNORMAL HIGH (ref 70–99)
Glucose-Capillary: 256 mg/dL — ABNORMAL HIGH (ref 70–99)
Glucose-Capillary: 263 mg/dL — ABNORMAL HIGH (ref 70–99)
Glucose-Capillary: 292 mg/dL — ABNORMAL HIGH (ref 70–99)

## 2024-07-24 LAB — MENINGITIS/ENCEPHALITIS PANEL (CSF)

## 2024-07-24 LAB — BASIC METABOLIC PANEL WITH GFR
Anion gap: 9 (ref 5–15)
BUN: 15 mg/dL (ref 8–23)
CO2: 29 mmol/L (ref 22–32)
Calcium: 8.8 mg/dL — ABNORMAL LOW (ref 8.9–10.3)
Chloride: 107 mmol/L (ref 98–111)
Creatinine, Ser: 0.6 mg/dL (ref 0.44–1.00)
GFR, Estimated: >60 mL/min (ref >60)
Glucose, Bld: 261 mg/dL — ABNORMAL HIGH (ref 70–99)
Potassium: 3.5 mmol/L (ref 3.5–5.1)
Sodium: 145 mmol/L (ref 135–145)

## 2024-07-24 LAB — CBC WITH DIFFERENTIAL/PLATELET
Abs Immature Granulocytes: 0.04 K/uL (ref 0.00–0.07)
Basophils Absolute: 0.1 K/uL (ref 0.0–0.1)
Basophils Relative: 1 %
Eosinophils Absolute: 0.1 K/uL (ref 0.0–0.5)
Eosinophils Relative: 1 %
HCT: 34.4 % — ABNORMAL LOW (ref 36.0–46.0)
Hemoglobin: 10.9 g/dL — ABNORMAL LOW (ref 12.0–15.0)
Immature Granulocytes: 1 %
Lymphocytes Relative: 18 %
Lymphs Abs: 1.5 K/uL (ref 0.7–4.0)
MCH: 27 pg (ref 26.0–34.0)
MCHC: 31.7 g/dL (ref 30.0–36.0)
MCV: 85.1 fL (ref 80.0–100.0)
Monocytes Absolute: 0.6 K/uL (ref 0.1–1.0)
Monocytes Relative: 7 %
Neutro Abs: 5.8 K/uL (ref 1.7–7.7)
Neutrophils Relative %: 72 %
Platelets: 180 K/uL (ref 150–400)
RBC: 4.04 MIL/uL (ref 3.87–5.11)
RDW: 16.6 % — ABNORMAL HIGH (ref 11.5–15.5)
WBC: 8 K/uL (ref 4.0–10.5)
nRBC: 0 % (ref 0.0–0.2)

## 2024-07-24 LAB — CSF CELL COUNT WITH DIFFERENTIAL
RBC Count, CSF: 130 /mm3 — ABNORMAL HIGH
Tube #: 1
WBC, CSF: 8 /mm3 — ABNORMAL HIGH (ref 0–5)

## 2024-07-24 LAB — PHOSPHORUS: Phosphorus: 2.3 mg/dL — ABNORMAL LOW (ref 2.5–4.6)

## 2024-07-24 LAB — THYROID STIMULATING IMMUNOGLOBULIN: Thyroid Stimulating Immunoglob: <0.1 IU/L (ref 0.00–0.55)

## 2024-07-24 LAB — PROTEIN AND GLUCOSE, CSF
Glucose, CSF: 102 mg/dL — ABNORMAL HIGH (ref 40–70)
Total  Protein, CSF: 36 mg/dL (ref 15–45)

## 2024-07-24 LAB — MAGNESIUM: Magnesium: 1.9 mg/dL (ref 1.7–2.4)

## 2024-07-24 LAB — HEPARIN LEVEL (UNFRACTIONATED): Heparin Unfractionated: 0.23 [IU]/mL — ABNORMAL LOW (ref 0.30–0.70)

## 2024-07-24 MED ORDER — ATORVASTATIN CALCIUM 80 MG PO TABS
80.0000 mg | ORAL_TABLET | Freq: Every day | ORAL | Status: AC
  Administered 2024-07-25 – 2024-10-09 (×76): 80 mg
  Filled 2024-07-24 (×70): qty 1

## 2024-07-24 MED ORDER — LOSARTAN POTASSIUM 50 MG PO TABS
50.0000 mg | ORAL_TABLET | Freq: Every day | ORAL | Status: DC
  Administered 2024-07-25 – 2024-07-30 (×6): 50 mg
  Filled 2024-07-24 (×6): qty 1

## 2024-07-24 MED ORDER — LIDOCAINE 1 % OPTIME INJ - NO CHARGE
2.0000 mL | Freq: Once | INTRAMUSCULAR | Status: AC
  Administered 2024-07-24: 5 mL via INTRADERMAL

## 2024-07-24 MED ORDER — ENOXAPARIN SODIUM 80 MG/0.8ML IJ SOSY
80.0000 mg | PREFILLED_SYRINGE | Freq: Two times a day (BID) | INTRAMUSCULAR | Status: DC
  Administered 2024-07-25 – 2024-08-01 (×16): 80 mg via SUBCUTANEOUS
  Filled 2024-07-24 (×16): qty 0.8

## 2024-07-24 MED ORDER — HEPARIN (PORCINE) 25000 UT/250ML-% IV SOLN
700.0000 [IU]/h | INTRAVENOUS | Status: AC
  Administered 2024-07-24: 600 [IU]/h via INTRAVENOUS
  Filled 2024-07-24: qty 250

## 2024-07-24 MED ORDER — INSULIN GLARGINE-YFGN 100 UNIT/ML ~~LOC~~ SOLN
25.0000 [IU] | Freq: Two times a day (BID) | SUBCUTANEOUS | Status: DC
  Administered 2024-07-24 – 2024-08-09 (×32): 25 [IU] via SUBCUTANEOUS
  Filled 2024-07-24 (×34): qty 0.25

## 2024-07-24 MED ORDER — LOSARTAN POTASSIUM 50 MG PO TABS
50.0000 mg | ORAL_TABLET | Freq: Every day | ORAL | Status: DC
  Administered 2024-07-24: 50 mg via ORAL
  Filled 2024-07-24: qty 1

## 2024-07-24 NOTE — Progress Notes (Signed)
 PHARMACY - ANTICOAGULATION CONSULT NOTE  Pharmacy Consult for  Heparin  Indication: History of DVT/PE  Allergies  Allergen Reactions   Hydrocodone Shortness Of Breath, Dermatitis and Other (See Comments)    I forget to breathe.   Clopidogrel Hives and Itching    Patient Measurements: Height: 5' 2 (157.5 cm) Weight: 80 kg (176 lb 5.9 oz) IBW/kg (Calculated) : 50.1 HEPARIN  DW (KG): 67.2  Vital Signs: Temp: 99.8 F (37.7 C) (11/21 1130) Temp Source: Oral (11/21 1130) BP: 165/86 (11/21 1130) Pulse Rate: 93 (11/21 1130)  Labs: Recent Labs    07/22/24 0833 07/23/24 0400 07/23/24 1640 07/24/24 0508  HGB 10.7* 10.7*  --  10.9*  HCT 33.9* 34.4*  --  34.4*  PLT 209 188  --  180  HEPARINUNFRC  --  >1.10* 0.90*  --   CREATININE 0.77 0.68  --  0.60    Estimated Creatinine Clearance: 72.4 mL/min (by C-G formula based on SCr of 0.6 mg/dL).   Medical History: Past Medical History:  Diagnosis Date   Anemia    Arthritis    back, hands, hips (02/22/2015)   CAD (coronary artery disease)    a. complex LAD/diagonal bifurcation PCI in 2010. b. STEMI 10/2019 s/p PTCA/DES x1 to mLAD overlapping the old stent, residual disease treated medicaly.   Cancer [facility])    uterine   CHF (congestive heart failure) (HCC)    Depression    Diabetes mellitus (HCC)    started when I was pregnant; not sure if it was type 1 or type 2    History of radiation therapy    Endometrium- HDR 01/22/22-03/07/22- Dr. Lynwood Nasuti   Hypercholesterolemia    Hypertension    Morbid obesity (HCC)    Myocardial infarction (HCC)    mild x 3   Neuromuscular disorder (HCC)    neuropathy feet   Sleep apnea    cpap/     Assessment: 63 yo female with endometrial cancer on chronic lovenox  for hx DVT/PE to undergo LP, therefore, Pharmacy consulted to transition to heparin  drip.   Now s/p LP. Per IR PA, may restart heparin  2 hrs after LP. Discussed with MD, will restart heparin  with plan to switch to  enoxaparin  11/22. Last heparin  level 0.9 on 800 units/hr.   Goal of Therapy:  Heparin  level 0.3-0.7 units/ml Monitor platelets by anticoagulation protocol: Yes   Plan:  Restart Heparin  at 600 units/hr F/u 8hr heparin  level   Monitor daily heparin  level, CBC, signs/symptoms of bleeding  Resume enoxaparin  80mg  q12hr 11/22 AM   Jinnie Door, PharmD, BCPS, BCCP Clinical Pharmacist  Please check AMION for all [facility] phone numbers After 10:00 PM, call Main Pharmacy [phone]

## 2024-07-24 NOTE — Progress Notes (Signed)
 Neurology plan of care  CSF not concerning for infection. I updated husband by phone. OK to de-escalate antibiotics - I d/c'd ampircillin and vanc and will continue meropenem  for UTI. While I was on the phone with husband he mentioned that she kept saying head. I let the RN know that if she appears to be in pain please give her a dose of her prn fentanyl . Occasionally people can get a headache after LP. I suspect she is just taking a very long time to clear her cefepime  and encephalopathy 2/2 infection. Now that we have ruled out meningitis, I would just give her some time.   OK to transition from heparin  gtt back to therapeutic lovenox  24 hrs after LP.  Neurology will be available prn for questions going forward.  Brenda Ross, MD [facility] [phone]  If 7pm- 7am, please page neurology on call as listed in AMION.

## 2024-07-24 NOTE — Procedures (Signed)
 Patient Name: Brenda [name]  MRN: [id number]  Epilepsy Attending: Arlin MALVA Krebs  Referring Physician/Provider: Khaliqdina, Salman, MD  Duration: 07/23/2024 9292 to 07/23/2024 1127   Patient history: 63yo F with ams. EEG to evaluate for seizure   Level of alertness: Awake/ lethargic   AEDs during EEG study: LEV   Technical aspects: This EEG study was done with scalp electrodes positioned according to the 10-20 International system of electrode placement. Electrical activity was reviewed with band pass filter of 1-70Hz , sensitivity of 7 uV/mm, display speed of 13mm/sec with a 60Hz  notched filter applied as appropriate. EEG data were recorded continuously and digitally stored.  Video monitoring was available and reviewed as appropriate.   Description: EEG showed continuous generalized 3 to 6 Hz theta-delta slowing.  Hyperventilation and photic stimulation were not performed.       ABNORMALITY - Continuous slow, generalized   IMPRESSION: This study is suggestive of generalized cerebral dysfunction (encephalopathy). No seizures or definite epileptiform discharges were noted.    Nobuo Nunziata O Maicol Bowland

## 2024-07-24 NOTE — Progress Notes (Signed)
 Daily Progress Note   Date: 07/24/2024   Patient Name: Brenda [name]  DOB: 05/10/1961  MRN: [id number]  Age / Sex: 63 y.o., female  Attending Physician: Brenda Pick, [name] Primary Care Physician: [facility] Admit Date: 07/15/2024 Length of Stay: 7 days  Reason for Follow-up: Establishing goals of care  Past Medical History:  Diagnosis Date   Anemia    Arthritis    back, hands, hips (02/22/2015)   CAD (coronary artery disease)    a. complex LAD/diagonal bifurcation PCI in 2010. b. STEMI 10/2019 s/p PTCA/DES x1 to mLAD overlapping the old stent, residual disease treated medicaly.   Cancer [name])    uterine   CHF (congestive heart failure) (HCC)    Depression    Diabetes mellitus (HCC)    started when I was pregnant; not sure if it was type 1 or type 2    History of radiation therapy    Endometrium- HDR 01/22/22-03/07/22- Dr. Lynwood [name]   Hypercholesterolemia    Hypertension    Morbid obesity (HCC)    Myocardial infarction (HCC)    mild x 3   Neuromuscular disorder (HCC)    neuropathy feet   Sleep apnea    cpap/    Assessment & Plan:   HPI/Patient Profile:  63 y.o. 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   Palliative medicine consulted for goals of care conversation.  SUMMARY OF RECOMMENDATIONS DNR-limited Open to all aggressive medical interventions including temporary artificial feeding tubes, family clear patient would not want to be prolonged interventions if she is unable to have a meaningful recovery Continue to monitor for improvement or deterioration for further discussion points with family  Symptom Management:  Per primary team  Code Status: DNR - Limited (DNR/DNI)  Prognosis: Unable to determine  Discharge Planning: To Be Determined  Discussed  with: Brenda [name] that the patient remains stable at this point and that the palliative medicine team will continue to follow along more peripherally over the next two days for deterioration.   Subjective:   Subjective: Chart Reviewed. Updates received. Patient Assessed. Created space and opportunity for patient  and family to explore thoughts and feelings regarding current medical situation.  Today's Discussion: Today before meeting with the patient/family, I reviewed the chart notes including Neurology note on 11/20 by Brenda [name]. I also reviewed vital signs, nursing flowsheets, medication administrations record.   Per neurology note on 11/20 by Brenda [name] - plan for lumbar puncture to assess meningitis.   Patient received fentanyl  50 mcg x5 in the last 2 shifts with adequate management of discomfort.   Met with patient bedside with daughter Brenda [name]) present. Decreased bilateral upper extremity tremors with elevation of arms compared to prior visits. Provided some resources for the daughter to discuss serious illness with her 65 year old child and her 76 year old brother who is on the autism spectrum.  Daughter inquired about a conversation her sister had with the nurse yesterday concerning dependence on fentanyl  and reassured her that the patient has legitimate pain associated with her cancer and that she should not be worried about.   Daughter expressed concern about the patient's planned lumbar puncture at this time to assess for meningitis. She did not feel like a lumbar puncture is in the best interest of her mother right now, but understands her father's continued desire  to continue all aggressive medical interventions to prolong the patient's life. She shares that her sister and her are more aligned with more comfort measures going forward and are aware that things are not improving.   Daughter shared that her father said that he would attempt to perform CPR on the patient himself even with  the DNR order in place knowing that it would be against the patient's wishes.   Review of Systems  Unable to perform ROS  Objective:   Primary Diagnoses: Present on Admission:  UTI (urinary tract infection)  Chronic hypoxic respiratory failure (HCC)  Anxiety with depression  CAD (coronary artery disease)  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  DNR (do not resuscitate)  Endometrial carcinoma (HCC)  Obstructive sleep apnea  Acute metabolic encephalopathy   Vital Signs:  BP (!) 184/86 (BP Location: Left Arm)   Pulse (!) 107   Temp 99.4 F (37.4 C) (Axillary)   Resp 18   Ht 5' 2 (1.575 m)   Wt 80 kg   LMP 10/30/2014 (Approximate)   SpO2 100%   BMI 32.26 kg/m   Physical Exam Constitutional:      Comments: Somnolent.   HENT:     Head: Normocephalic.     Nose: Nose normal.  Cardiovascular:     Rate and Rhythm: Normal rate.     Pulses: Normal pulses.  Pulmonary:     Effort: Pulmonary effort is normal.  Abdominal:     Palpations: Abdomen is soft.  Skin:    General: Skin is warm.    Palliative Assessment/Data: 10%   Existing Vynca/ACP Documentation: None  Thank you for allowing us  to participate in the care of Brenda [name] PMT will continue to support holistically.  I personally spent a total of 35 minutes in the care of the patient today including preparing to see the patient, getting/reviewing separately obtained history, performing a medically appropriate exam/evaluation, counseling and educating, referring and communicating with other health care professionals, and documenting clinical information in the EHR.  Brenda [name]  Palliative Medicine Team  Team Phone # [phone] (Nights/Weekends) 07/24/2024 4:28 PM

## 2024-07-24 NOTE — Plan of Care (Signed)
   Problem: Education: Goal: Ability to describe self-care measures that may prevent or decrease complications (Diabetes Survival Skills Education) will improve Outcome: Progressing Goal: Individualized Educational Video(s) Outcome: Progressing

## 2024-07-24 NOTE — TOC Progression Note (Signed)
 Transition of Care [facility]) - Progression Note    Patient Details  Name: Brenda [name] MRN: [id number] Date of Birth: 22-Apr-1961  Transition of Care [facility]) CM/SW Contact  [name], [place] Phone Number: 07/24/2024, 3:55 PM  Clinical Narrative:   CSW contacted by palliative PA about resources for children regarding chronic/serious illness and grief. CSW contacted [facility] and [facility] to ask about resources they may have, provided information to palliative PA to pass on to family. CSW to follow for disposition when appropriate, medical workup still ongoing at this time.    Expected Discharge Plan:  (TBD) Barriers to Discharge: Continued Medical Work up               Expected Discharge Plan and Services                                               Social Drivers of Health (SDOH) Interventions SDOH Screenings   Food Insecurity: No Food Insecurity (07/16/2024)  Recent Concern: Food Insecurity - Food Insecurity Present (04/20/2024)  Housing: Low Risk  (07/16/2024)  Transportation Needs: No Transportation Needs (07/16/2024)  Utilities: Not At Risk (07/16/2024)  Depression (PHQ2-9): Low Risk  (06/30/2024)  Recent Concern: Depression (PHQ2-9) - Medium Risk (05/07/2024)  Financial Resource Strain: High Risk (02/10/2021)  Social Connections: Moderately Integrated (07/13/2024)  Tobacco Use: Medium Risk (07/16/2024)    Readmission Risk Interventions    07/14/2024    2:34 PM 05/04/2024   10:00 AM  Readmission Risk Prevention Plan  Transportation Screening Complete Complete  PCP or Specialist Appt within 3-5 Days  Complete  HRI or Home Care Consult  Complete  Social Work Consult for Recovery Care Planning/Counseling  Complete  Palliative Care Screening  Not Applicable  Medication Review [employer]) Complete Complete  PCP or Specialist appointment within 3-5 days of discharge Complete   HRI or Home Care Consult Complete   SW  Recovery Care/Counseling Consult Complete   Palliative Care Screening Not Applicable   Skilled Nursing Facility Not Applicable

## 2024-07-24 NOTE — Progress Notes (Addendum)
 PROGRESS NOTE    Brenda [name]  FMW:[id number] DOB: 1961/04/02 DOA: 07/15/2024 PCP: [facility]   Brief Narrative:  63 year old female with history of metastatic endometrial cancer, chronic cancer-related pain, hypertension, hyperlipidemia, diabetes mellitus, depression, anxiety, CVA, history of DVT and PE anticoagulated on Lovenox , OSA, chronic hypoxic respiratory failure on 2L via nasal cannula and recent hospitalization and discharged on 07/14/2024 for UTI treated with IV antibiotics followed by oral antibiotics on discharge presented with chills and weakness.  She was found to have UTI and was started on IV cefepime .  Unfortunately mental status worsened and she was transferred to [facility] for continuous EEG monitoring. Started on Keppra  11/16, mental status remains altered 11/19: Broad-spectrum antibiotics to cover CNS  11/20: Video EEG discontinued 11/21: Plan for LP, continue on broad-spectrum antibiotics for CNS coverage  Assessment & Plan:  Acute metabolic encephalopathy Subclinical seizures - Encephalopathy prior to admission thought to be secondary to UTI.  Started on cefepime  with worsening mental status.  On 11/15, stiffening of upper extremities, patient transferred to [facility] for continuous video EEG.  MRI brain on 11/15 is negative for acute findings.  Shows evolution of ischemic infarcts similar to before.   IV cefepime  could be culprit and this was discontinued. Husband  says when he called 911 pt was having similar symptoms of bilateral upper extremities movement and change in mentation Video EEG: 4 seizures noted on 11/16, keppra  given 11/16 night. Will continue on keppra  500 bid per neurology Continuous video EEG discontinued 11/20 with stable EEG. Discussed with neurology, broad-spectrum antibiotics for minimal encephalitis added 11/19.   Plan for LP 11/21  UTI: Present on admission, failed p.o. antibiotics - Blood and urine cultures negative so  far.  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  Mild leukocytosis, blood culture negative so far  Endometrial cancer with metastases Chronic cancer-related pain with opiate dependence.  Will continue fentanyl , oxy as needed for pain. Fentanyl  patch could be an option.  Will defer to palliative care. - Outpatient follow-up with oncology/Dr. Lonn.  Continue current pain management. - family requested second opinion at [facility].  Referral will be sent for outpatient  Hypomagnesemia, replace as needed.  Hyperthyroidism, TSH is 0.23,  free T4 level elevated 1.4.  Mild suppression of TSH.  Likely from ongoing illness.  Antithyroid medications are not indicated at this time.  Will add propranolol  to help with tremors.  Patient additionally hypertensive and tachycardic.  Chronic respiratory failure with hypoxia OSA - Respiratory status stable.  Continue supplemental oxygen .  Continue CPAP at night  History of PE and DVT - Continue IV heparin  as getting LP.  Diabetes mellitus type 2 with intermittent hypoglycemia -Will increase Lantus  30 unit daily, as scheduled NovoLog  4 unit every 4, and continue with sliding scale insulin  coverage.  Hypomagnesemia - Resolved  Hypernatremia: Na 150 due to lack of water .,  Improving to 145 after initiation of tube feeding and water  flushes.  Will continue 200 mL every 6 hours free water  flushes  Anemia of chronic disease - From cancer.  Hemoglobin stable.  No signs of bleeding.  Monitor intermittently  Depression/anxiety - Prozac , Remeron , Haldol  discontinued as discussed above.    History of CAD History of stroke Hyperlipidemia -Continue statin.  Not on aspirin  since she is on treatment dose anticoagulation Obesity class I - Outpatient follow-up  Physical deconditioning - Likely will need SNF pending her clinical course.  Nutrition: Started on cortrak feeding 11/19.  DVT  prophylaxis: Heparin  therapeutic dose.   Code Status: DNR Family Communication: daughter at the bedside disposition Plan: Status is:  inpatient because: Severity of illness    Consultants: Neuro  Procedures: None  Antimicrobials: Cefepime  from 07/16/2024 -11/14, Meropenem  11/15- Vancomycin  11/19, ampicillin  11/19, acyclovir 11/19  Subjective:  Patient seen and examined at the bedside.  Daughter is present.  Patient remains poorly responsive.  Vital signs are stable.  Afebrile.  Plan for lumbar puncture today  Objective: Vitals:   07/23/24 2347 07/24/24 0409 07/24/24 0500 07/24/24 0817  BP: (!) 158/66 (!) 141/63  (!) 161/92  Pulse: 89 99  (!) 103  Resp: 17 17  18   Temp: 99.6 F (37.6 C) 99.7 F (37.6 C)  98.4 F (36.9 C)  TempSrc:    Oral  SpO2: 100% 100%  100%  Weight:   80 kg   Height:        Intake/Output Summary (Last 24 hours) at 07/24/2024 0828 Last data filed at 07/24/2024 9381 Gross per 24 hour  Intake 3107.78 ml  Output 1470 ml  Net 1637.78 ml   Filed Weights   07/22/24 0500 07/23/24 0706 07/24/24 0500  Weight: 80.1 kg 80.1 kg 80 kg    Examination:  General: lethargic, does not follow commands Chest: Clear CVs: S1, S2, no murmur, regular rhythm Abdomen: Soft, nontender Extremities: No edema Neuro: lethargic, bilateral dilated pupils,reactive, doesn't respond to voice, withdrawal to pain  Data Reviewed: I have personally reviewed following labs and imaging studies  CBC: Recent Labs  Lab 07/20/24 1120 07/21/24 1107 07/22/24 0833 07/23/24 0400 07/24/24 0508  WBC 11.8* 9.4 7.0 8.6 8.0  NEUTROABS 9.6* 7.1 4.7 6.3 5.8  HGB 12.2 11.6* 10.7* 10.7* 10.9*  HCT 37.5 36.5 33.9* 34.4* 34.4*  MCV 83.0 83.9 85.4 87.3 85.1  PLT 280 257 209 188 180   Basic Metabolic Panel: Recent Labs  Lab 07/18/24 0554 07/19/24 0245 07/20/24 1120 07/22/24 0833 07/23/24 0400 07/24/24 0508  NA 139 136 140 150* 146* 145  K 4.4 4.1 4.3 3.6 3.5 3.5  CL 103 101  108 113* 109 107  CO2 24 19* 16* 26 25 29   GLUCOSE 189* 236* 288* 167* 308* 261*  BUN 9 11 8 15 16 15   CREATININE 0.56 0.76 0.90 0.77 0.68 0.60  CALCIUM  9.4 9.1 9.6 9.4 9.2 8.8*  MG 1.8 1.5* 1.4*  --  1.6* 1.9  PHOS  --   --   --   --  2.1* 2.3*   GFR: Estimated Creatinine Clearance: 72.4 mL/min (by C-G formula based on SCr of 0.6 mg/dL). Liver Function Tests: Recent Labs  Lab 07/19/24 0245 07/22/24 0833  AST 25 19  ALT 29 18  ALKPHOS 76 57  BILITOT 1.2 0.2  PROT 7.0 6.5  ALBUMIN 3.2* 2.8*   No results for input(s): LIPASE, AMYLASE in the last 168 hours. Recent Labs  Lab 07/19/24 0245  AMMONIA <13   Coagulation Profile: No results for input(s): INR, PROTIME in the last 168 hours.  Cardiac Enzymes: No results for input(s): CKTOTAL, CKMB, CKMBINDEX, TROPONINI in the last 168 hours. BNP (last 3 results) Recent Labs    05/22/24 0630  PROBNP 3,514.0*   HbA1C: No results for input(s): HGBA1C in the last 72 hours. CBG: Recent Labs  Lab 07/23/24 1541 07/23/24 2016 07/24/24 0000 07/24/24 0414 07/24/24 0753  GLUCAP 243* 302* 263* 256* 226*   Lipid Profile: No results for input(s): CHOL, HDL, LDLCALC, TRIG, CHOLHDL, LDLDIRECT in the last  72 hours. Thyroid  Function Tests: Recent Labs    07/22/24 0833  TSH 0.315*  T3FREE 2.4   Anemia Panel: No results for input(s): VITAMINB12, FOLATE, FERRITIN, TIBC, IRON, RETICCTPCT in the last 72 hours.  Sepsis Labs: No results for input(s): PROCALCITON, LATICACIDVEN in the last 168 hours.   Recent Results (from the past 240 hours)  Urine Culture     Status: None   Collection Time: 07/16/24  1:43 AM   Specimen: Urine, Random  Result Value Ref Range Status   Specimen Description   Final    URINE, RANDOM Performed at [facility], [address], [place], [place] [address]    Special Requests   Final    NONE Reflexed from [phone] Performed at [facility], [address] [place], [place] [address]    Culture   Final    NO GROWTH Performed at [facility], [address], [place], [place] [address]    Report Status 07/17/2024 FINAL  Final  Blood Culture (routine x 2)     Status: None   Collection Time: 07/16/24  2:00 AM   Specimen: BLOOD  Result Value Ref Range Status   Specimen Description   Final    BLOOD RIGHT ANTECUBITAL Performed at [facility], [address] [place], [place] [address]    Special Requests   Final    Blood Culture adequate volume BOTTLES DRAWN AEROBIC AND ANAEROBIC Performed at [facility], [address] [place], [place] [address]    Culture   Final    NO GROWTH 5 DAYS Performed at [facility], [address] [place], [place] [address]    Report Status 07/21/2024 FINAL  Final  Blood Culture (routine x 2)     Status: None   Collection Time: 07/16/24  2:00 AM   Specimen: BLOOD  Result Value Ref Range Status   Specimen Description   Final    BLOOD LEFT ANTECUBITAL Performed at [facility], [address] [place], [place] [address]    Special Requests   Final    Blood Culture adequate volume BOTTLES DRAWN AEROBIC AND ANAEROBIC Performed at [facility], [address] [place], [place] [address]    Culture   Final    NO GROWTH 5 DAYS Performed at [facility], [address] [place], [place] [address]    Report Status 07/21/2024 FINAL  Final  Culture, blood (Routine X 2) w Reflex to ID Panel     Status: None (Preliminary result)   Collection Time: 07/21/24 11:07 AM   Specimen: BLOOD RIGHT HAND  Result Value Ref Range Status   Specimen Description BLOOD RIGHT HAND  Final   Special Requests   Final    BOTTLES DRAWN AEROBIC AND ANAEROBIC Blood Culture results may not be optimal due to an inadequate volume of blood received in culture bottles   Culture   Final    NO GROWTH 3  DAYS Performed at [facility], [address], [place], [place] [address]    Report Status PENDING  Incomplete  Culture, blood (Routine X 2) w Reflex to ID Panel     Status: None (Preliminary result)   Collection Time: 07/21/24 11:07 AM   Specimen: BLOOD LEFT ARM  Result Value Ref Range Status   Specimen Description BLOOD LEFT ARM  Final   Special Requests   Final    BOTTLES DRAWN  AEROBIC AND ANAEROBIC Blood Culture adequate volume   Culture   Final    NO GROWTH 3 DAYS Performed at [facility], [address], [place], [place] [address]    Report Status PENDING  Incomplete         Radiology Studies: DG Abd Portable 1V Result Date: 07/22/2024 CLINICAL DATA:  Feeding tube placement. EXAM: PORTABLE ABDOMEN - 1 VIEW COMPARISON:  12/08/2021. FINDINGS: Enteric tube tip overlies the distal stomach. IMPRESSION: Enteric tube tip in the distal stomach. Electronically Signed   By: Harrietta Sherry M.D.   On: 07/22/2024 11:33        Scheduled Meds:  atorvastatin   80 mg Oral Daily   Chlorhexidine  Gluconate Cloth  6 each Topical Daily   feeding supplement (PROSource TF20)  60 mL Per Tube Daily   free water   200 mL Per Tube Q6H   insulin  aspart  0-15 Units Subcutaneous Q4H   insulin  aspart  4 Units Subcutaneous Q4H   insulin  glargine-yfgn  30 Units Subcutaneous Daily   levETIRAcetam   500 mg Per Tube BID   lidocaine   1 patch Transdermal Daily   phosphorus  250 mg Per Tube BID   propranolol   20 mg Per Tube TID   sodium chloride  flush  10-40 mL Intracatheter Q12H   thiamine   100 mg Per Tube Daily   Continuous Infusions:  ampicillin  (OMNIPEN) IV 2 g (07/24/24 0458)   feeding supplement (OSMOLITE 1.5 CAL) 45 mL/hr at 07/23/24 0300   meropenem  (MERREM ) IV 2 g (07/24/24 0617)   vancomycin  1,000 mg (07/24/24 9388)          Derryl Duval, MD [facility] 07/24/2024, 8:28 AM

## 2024-07-24 NOTE — Inpatient Diabetes Management (Signed)
 Inpatient Diabetes Program Recommendations  AACE/ADA: New Consensus Statement on Inpatient Glycemic Control (2015)  Target Ranges:  Prepandial:   less than 140 mg/dL      Peak postprandial:   less than 180 mg/dL (1-2 hours)      Critically ill patients:  140 - 180 mg/dL   Lab Results  Component Value Date   GLUCAP 226 (H) 07/24/2024   HGBA1C 7.2 (H) 06/01/2024    Review of Glycemic Control  Latest Reference Range & Units 07/23/24 07:40 07/23/24 11:16 07/23/24 15:41 07/23/24 20:16 07/24/24 00:00 07/24/24 04:14 07/24/24 07:53  Glucose-Capillary 70 - 99 mg/dL 748 (H) 765 (H) 756 (H) 302 (H) 263 (H) 256 (H) 226 (H)   Diabetes history: DM Outpatient Diabetes medications:  Tresiba  18 units daily Fiasp  1-3 units tid with meals  Metformin  1000 mg bid Current orders for Inpatient glycemic control:  Novolog  0-15 units q 4 hours Semglee  30 units daily Novolog  4 units Q4 hours  Osmolite 1.5 cal 45 ml/hr  Inpatient Diabetes Program Recommendations:    -   Increase Semglee  to 40 units -   Increase Tube Feed Coverage to 6 units  Thanks,   Clotilda Bull RN, MSN, BC-ADM Inpatient Diabetes Coordinator Team Pager [phone] (8a-5p)

## 2024-07-24 NOTE — Plan of Care (Signed)
  Problem: Education: Goal: Ability to describe self-care measures that may prevent or decrease complications (Diabetes Survival Skills Education) will improve Outcome: Progressing   Problem: Coping: Goal: Ability to adjust to condition or change in health will improve Outcome: Progressing   Problem: Education: Goal: Knowledge of General Education information will improve Description: Including pain rating scale, medication(s)/side effects and non-pharmacologic comfort measures Outcome: Progressing   

## 2024-07-24 NOTE — Procedures (Signed)
 PROCEDURE SUMMARY:  Successful fluoroscopic guided lumbar puncture. No immediate complications.  Pt tolerated well.   EBL = none  Please see full dictation in imaging section of Epic for procedure details.

## 2024-07-24 NOTE — Progress Notes (Signed)
 PHARMACY - ANTICOAGULATION CONSULT NOTE  Pharmacy Consult for heparin  Indication: h/o VTE  Labs: Recent Labs    07/22/24 0833 07/23/24 0400 07/23/24 1640 07/24/24 0508 07/24/24 2218  HGB 10.7* 10.7*  --  10.9*  --   HCT 33.9* 34.4*  --  34.4*  --   PLT 209 188  --  180  --   HEPARINUNFRC  --  >1.10* 0.90*  --  0.23*  CREATININE 0.77 0.68  --  0.60  --    Assessment: 63yo female subvtherapeutic on heparin  after resuming s/p LP; no infusion issues or signs of bleeding per RN.  Goal of Therapy:  Heparin  level 0.3-0.7 units/ml   Plan:  Increase heparin  infusion slightly to 700 units/hr until transition to LMWH in am.  Marvetta Dauphin, PharmD, BCPS 07/24/2024 11:17 PM

## 2024-07-25 DIAGNOSIS — E440 Moderate protein-calorie malnutrition: Secondary | ICD-10-CM

## 2024-07-25 DIAGNOSIS — J9611 Chronic respiratory failure with hypoxia: Secondary | ICD-10-CM | POA: Diagnosis not present

## 2024-07-25 DIAGNOSIS — E870 Hyperosmolality and hypernatremia: Secondary | ICD-10-CM

## 2024-07-25 DIAGNOSIS — Z794 Long term (current) use of insulin: Secondary | ICD-10-CM

## 2024-07-25 DIAGNOSIS — Z86718 Personal history of other venous thrombosis and embolism: Secondary | ICD-10-CM

## 2024-07-25 DIAGNOSIS — E118 Type 2 diabetes mellitus with unspecified complications: Secondary | ICD-10-CM

## 2024-07-25 DIAGNOSIS — G9341 Metabolic encephalopathy: Secondary | ICD-10-CM | POA: Diagnosis not present

## 2024-07-25 DIAGNOSIS — N390 Urinary tract infection, site not specified: Secondary | ICD-10-CM | POA: Diagnosis not present

## 2024-07-25 DIAGNOSIS — G4733 Obstructive sleep apnea (adult) (pediatric): Secondary | ICD-10-CM

## 2024-07-25 DIAGNOSIS — C541 Malignant neoplasm of endometrium: Secondary | ICD-10-CM

## 2024-07-25 DIAGNOSIS — G893 Neoplasm related pain (acute) (chronic): Secondary | ICD-10-CM

## 2024-07-25 DIAGNOSIS — F418 Other specified anxiety disorders: Secondary | ICD-10-CM

## 2024-07-25 LAB — GLUCOSE, CAPILLARY
Glucose-Capillary: 231 mg/dL — ABNORMAL HIGH (ref 70–99)
Glucose-Capillary: 148 mg/dL — ABNORMAL HIGH (ref 70–99)
Glucose-Capillary: 221 mg/dL — ABNORMAL HIGH (ref 70–99)
Glucose-Capillary: 179 mg/dL — ABNORMAL HIGH (ref 70–99)
Glucose-Capillary: 196 mg/dL — ABNORMAL HIGH (ref 70–99)

## 2024-07-25 LAB — CBC
HCT: 34.2 % — ABNORMAL LOW (ref 36.0–46.0)
Hemoglobin: 10.8 g/dL — ABNORMAL LOW (ref 12.0–15.0)
MCH: 26.9 pg (ref 26.0–34.0)
MCHC: 31.6 g/dL (ref 30.0–36.0)
MCV: 85.3 fL (ref 80.0–100.0)
Platelets: 174 K/uL (ref 150–400)
RBC: 4.01 MIL/uL (ref 3.87–5.11)
RDW: 16.4 % — ABNORMAL HIGH (ref 11.5–15.5)
WBC: 8.5 K/uL (ref 4.0–10.5)
nRBC: 0 % (ref 0.0–0.2)

## 2024-07-25 LAB — BASIC METABOLIC PANEL WITH GFR
Anion gap: 10 (ref 5–15)
BUN: 15 mg/dL (ref 8–23)
CO2: 30 mmol/L (ref 22–32)
Calcium: 8.7 mg/dL — ABNORMAL LOW (ref 8.9–10.3)
Chloride: 106 mmol/L (ref 98–111)
Creatinine, Ser: 0.49 mg/dL (ref 0.44–1.00)
GFR, Estimated: >60 mL/min (ref >60)
Glucose, Bld: 196 mg/dL — ABNORMAL HIGH (ref 70–99)
Potassium: 3.2 mmol/L — ABNORMAL LOW (ref 3.5–5.1)
Sodium: 146 mmol/L — ABNORMAL HIGH (ref 135–145)

## 2024-07-25 LAB — MAGNESIUM: Magnesium: 1.8 mg/dL (ref 1.7–2.4)

## 2024-07-25 LAB — PHOSPHORUS: Phosphorus: 2.1 mg/dL — ABNORMAL LOW (ref 2.5–4.6)

## 2024-07-25 MED ORDER — FREE WATER
200.0000 mL | Status: DC
  Administered 2024-07-25 – 2024-07-29 (×24): 200 mL

## 2024-07-25 MED ORDER — POTASSIUM CHLORIDE CRYS ER 20 MEQ PO TBCR
40.0000 meq | EXTENDED_RELEASE_TABLET | Freq: Two times a day (BID) | ORAL | Status: AC
  Administered 2024-07-25 (×2): 40 meq via ORAL
  Filled 2024-07-25 (×2): qty 2

## 2024-07-25 NOTE — Consult Note (Signed)
 WOC Nurse Consult Note: Reason for Consult: pressure injury sacrum; patient admitted for 9 days.   Wound type: Deep Tissue Pressure Injury; sacrum; 100% dark purple non blanchable  Pressure Injury POA: No Measurement: see nursing flow sheets Wound bed: see above  Drainage (amount, consistency, odor) see nursing flow sheets Periwound: intact  Dressing procedure/placement/frequency: Cleanse sacral wound with saline, cover with single layer of xeroform and top with foam. Change every other day  Turn and resposition per hospital policy    Re consult if needed, will not follow at this time. Thanks  Adisa [employer]itt [employer], RN,CWOCN, CNS, [employer] [phone]

## 2024-07-25 NOTE — Progress Notes (Signed)
 PROGRESS NOTE    Brenda [name]  FMW:[id number] DOB: September 05, 1960 DOA: 07/15/2024 PCP: [facility]   Brief Narrative:  63 year old female with history of metastatic endometrial cancer, chronic cancer-related pain, hypertension, hyperlipidemia, diabetes mellitus, depression, anxiety, CVA, history of DVT and PE anticoagulated on Lovenox , OSA, chronic hypoxic respiratory failure on 2L via nasal cannula and recent hospitalization and discharged on 07/14/2024 for UTI treated with IV antibiotics followed by oral antibiotics on discharge presented with chills and weakness.  She was found to have UTI and was started on IV cefepime .  Unfortunately mental status worsened and she was transferred to [facility] for continuous EEG monitoring. Started on Keppra  11/16, mental status remains altered 11/19: Broad-spectrum antibiotics to cover CNS  11/20: Video EEG discontinued 11/21: Plan for LP, continue on broad-spectrum antibiotics for CNS coverage 11/22: S/p LP, meningitis/encephalitis panel negative, neurology discontinued antibiotics.  Worsening mental status per husband.  Not able to participate with any meaningful history and following only few commands, slow recovery from cefepime  versus paraneoplastic syndrome with her history of advanced malignancy.  Patient with very poor prognosis.  Assessment & Plan:  Acute metabolic encephalopathy Subclinical seizures - Encephalopathy prior to admission thought to be secondary to UTI.  Started on cefepime  with worsening mental status.  On 11/15, stiffening of upper extremities, patient transferred to [facility] for continuous video EEG.  MRI brain on 11/15 is negative for acute findings.  Shows evolution of ischemic infarcts similar to before.   IV cefepime  could be culprit and this was discontinued. Husband  says when he called 911 pt was having similar symptoms of bilateral upper extremities movement and change in mentation Video EEG: 4 seizures  noted on 11/16, keppra  given 11/16 night. Will continue on keppra  500 bid per neurology Continuous video EEG discontinued 11/20 with stable EEG. 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  UTI: Present on admission, failed p.o. antibiotics - Blood and urine cultures negative so far.  - Antibiotics were discontinued, no leukocytosis or other sign of infection at this time.  Low-grade temperature: Remained afebrile  Endometrial cancer with metastases Chronic cancer-related pain with opiate dependence.  Will continue fentanyl , oxy as needed for pain. Fentanyl  patch could be an option.  Will defer to palliative care. - Outpatient follow-up with oncology/Dr. Lonn.  Continue current pain management. - family requested second opinion at [facility].  Referral will be sent for outpatient  Hypomagnesemia, replace as needed.  Hypernatremia.  Mild hyponatremia with sodium of 146 today, - Increasing free water  through tube  Hypophosphatemia.  Phosphorus of 2.1, replace phosphorous and monitor  Hypokalemia.  Potassium of 3.2-replace potassium and monitor  Hyperthyroidism, TSH is 0.23,  free T4 level elevated 1.4.  Mild suppression of TSH.  Likely from ongoing illness.  Antithyroid medications are not indicated at this time.  Will add propranolol  to help with tremors.  Patient additionally hypertensive and tachycardic.  Chronic respiratory failure with hypoxia OSA - Respiratory status stable.  Continue supplemental oxygen .  Continue CPAP at night  History of PE and DVT - Continue IV heparin  as getting LP.  Diabetes mellitus type 2  - Continue with Lantus  and SSI  Anemia of chronic disease - From cancer.  Hemoglobin stable.  No signs of bleeding.  Monitor intermittently  Depression/anxiety - Prozac , Remeron , Haldol  discontinued as discussed  above.    History of CAD History of stroke Hyperlipidemia -Continue  statin.  Not on aspirin  since she is on treatment dose anticoagulation Obesity class I - Outpatient follow-up  Physical deconditioning - Likely will need SNF pending her clinical course.  Nutrition: Started on cortrak feeding 11/19.  DVT prophylaxis: Heparin  therapeutic dose.   Code Status: DNR Family Communication: Discussed with husband at bedside disposition Plan: Status is:  inpatient because: Severity of illness  Consultants: Neuro, palliative care  Procedures: None  Antimicrobials: Cefepime  from 07/16/2024 -11/14, Meropenem  11/15- Vancomycin  11/19, ampicillin  11/19, acyclovir 11/19 All antibiotics stopped on 07/24/2024  Subjective: Patient was seen and examined today.  Very poorly responsive, only squeezed my hand once with a very weak grip.  Intermittent moaning and shakiness.  Objective: Vitals:   07/24/24 2331 07/25/24 0348 07/25/24 0803 07/25/24 1201  BP: (!) 155/59 (!) 113/51 (!) 146/83 (!) 156/72  Pulse: 79 78 (!) 103 79  Resp: 16 18 20 18   Temp: 98 F (36.7 C) (!) 97.5 F (36.4 C) 99.2 F (37.3 C) 99 F (37.2 C)  TempSrc: Oral Oral Oral Oral  SpO2: 100% 100% 100% 100%  Weight:      Height:        Intake/Output Summary (Last 24 hours) at 07/25/2024 1451 Last data filed at 07/25/2024 1132 Gross per 24 hour  Intake 1661.81 ml  Output 750 ml  Net 911.81 ml   Filed Weights   07/22/24 0500 07/23/24 0706 07/24/24 0500  Weight: 80.1 kg 80.1 kg 80 kg    Examination:  General.  Ill-appearing, minimally responsive lady, in no acute distress. Pulmonary.  Lungs clear bilaterally, normal respiratory effort. CV.  Regular rate and rhythm, no JVD, rub or murmur. Abdomen.  Soft, nontender, nondistended, BS positive. CNS.  Minimally responsive with intermittent moaning Extremities.  No edema, pulses intact and symmetrical.  Data Reviewed: I have personally reviewed following labs and  imaging studies  CBC: Recent Labs  Lab 07/20/24 1120 07/21/24 1107 07/22/24 0833 07/23/24 0400 07/24/24 0508 07/25/24 0555  WBC 11.8* 9.4 7.0 8.6 8.0 8.5  NEUTROABS 9.6* 7.1 4.7 6.3 5.8  --   HGB 12.2 11.6* 10.7* 10.7* 10.9* 10.8*  HCT 37.5 36.5 33.9* 34.4* 34.4* 34.2*  MCV 83.0 83.9 85.4 87.3 85.1 85.3  PLT 280 257 209 188 180 174   Basic Metabolic Panel: Recent Labs  Lab 07/19/24 0245 07/20/24 1120 07/22/24 0833 07/23/24 0400 07/24/24 0508 07/25/24 0555  NA 136 140 150* 146* 145 146*  K 4.1 4.3 3.6 3.5 3.5 3.2*  CL 101 108 113* 109 107 106  CO2 19* 16* 26 25 29 30   GLUCOSE 236* 288* 167* 308* 261* 196*  BUN 11 8 15 16 15 15   CREATININE 0.76 0.90 0.77 0.68 0.60 0.49  CALCIUM  9.1 9.6 9.4 9.2 8.8* 8.7*  MG 1.5* 1.4*  --  1.6* 1.9 1.8  PHOS  --   --   --  2.1* 2.3* 2.1*   GFR: Estimated Creatinine Clearance: 72.4 mL/min (by C-G formula based on SCr of 0.49 mg/dL). Liver Function Tests: Recent Labs  Lab 07/19/24 0245 07/22/24 0833  AST 25 19  ALT 29 18  ALKPHOS 76 57  BILITOT 1.2 0.2  PROT 7.0 6.5  ALBUMIN 3.2* 2.8*   No results for input(s): LIPASE, AMYLASE in the last 168 hours. Recent Labs  Lab 07/19/24 0245  AMMONIA <13   Coagulation Profile: No results for input(s): INR, PROTIME in the last 168 hours.  Cardiac Enzymes: No results for input(s): CKTOTAL, CKMB, CKMBINDEX, TROPONINI in the last  168 hours. BNP (last 3 results) Recent Labs    05/22/24 0630  PROBNP 3,514.0*   HbA1C: No results for input(s): HGBA1C in the last 72 hours. CBG: Recent Labs  Lab 07/24/24 1930 07/24/24 2358 07/25/24 0324 07/25/24 0801 07/25/24 1201  GLUCAP 250* 292* 231* 148* 221*   Lipid Profile: No results for input(s): CHOL, HDL, LDLCALC, TRIG, CHOLHDL, LDLDIRECT in the last 72 hours. Thyroid  Function Tests: No results for input(s): TSH, T4TOTAL, FREET4, T3FREE, THYROIDAB in the last 72 hours.  Anemia Panel: No  results for input(s): VITAMINB12, FOLATE, FERRITIN, TIBC, IRON, RETICCTPCT in the last 72 hours.  Sepsis Labs: No results for input(s): PROCALCITON, LATICACIDVEN in the last 168 hours.   Recent Results (from the past 240 hours)  Urine Culture     Status: None   Collection Time: 07/16/24  1:43 AM   Specimen: Urine, Random  Result Value Ref Range Status   Specimen Description   Final    URINE, RANDOM Performed at [facility], [address] [place], [place] [address]    Special Requests   Final    NONE Reflexed from [id number] Performed at [facility], [address], [place], [place] [address]    Culture   Final    NO GROWTH Performed at [facility], [address], [place], [place] [address]    Report Status 07/17/2024 FINAL  Final  Blood Culture (routine x 2)     Status: None   Collection Time: 07/16/24  2:00 AM   Specimen: BLOOD  Result Value Ref Range Status   Specimen Description   Final    BLOOD RIGHT ANTECUBITAL Performed at [facility], [address], [place], [place] [address]    Special Requests   Final    Blood Culture adequate volume BOTTLES DRAWN AEROBIC AND ANAEROBIC Performed at [facility], [address], [place], [place] [address]    Culture   Final    NO GROWTH 5 DAYS Performed at [facility], [address], [place], [place] [address]    Report Status 07/21/2024 FINAL  Final  Blood Culture (routine x 2)     Status: None   Collection Time: 07/16/24  2:00 AM   Specimen: BLOOD  Result Value Ref Range Status   Specimen Description   Final    BLOOD LEFT ANTECUBITAL Performed at [facility], [address] [place], [place] [address]    Special Requests   Final    Blood Culture adequate volume BOTTLES DRAWN AEROBIC AND ANAEROBIC Performed at [facility], [address] [place], [place] [address]     Culture   Final    NO GROWTH 5 DAYS Performed at [facility], [address], [place], [place] [address]    Report Status 07/21/2024 FINAL  Final  Culture, blood (Routine X 2) w Reflex to ID Panel     Status: None (Preliminary result)   Collection Time: 07/21/24 11:07 AM   Specimen: BLOOD RIGHT HAND  Result Value Ref Range Status   Specimen Description BLOOD RIGHT HAND  Final   Special Requests   Final    BOTTLES DRAWN AEROBIC AND ANAEROBIC Blood Culture results may not be optimal due to an inadequate volume of blood received in culture bottles   Culture   Final    NO GROWTH 4 DAYS Performed at [facility], [address], [place], [place]  [address]    Report Status PENDING  Incomplete  Culture, blood (Routine X 2) w Reflex to ID Panel     Status: None (Preliminary result)   Collection Time: 07/21/24 11:07 AM   Specimen: BLOOD LEFT ARM  Result Value Ref Range Status   Specimen Description BLOOD LEFT ARM  Final   Special Requests   Final    BOTTLES DRAWN AEROBIC AND ANAEROBIC Blood Culture adequate volume   Culture   Final    NO GROWTH 4 DAYS Performed at [facility], [address], [place], [place] [address]    Report Status PENDING  Incomplete  CSF culture w Gram Stain     Status: None (Preliminary result)   Collection Time: 07/24/24  1:02 PM   Specimen: PATH Cytology CSF; Cerebrospinal Fluid  Result Value Ref Range Status   Specimen Description CYTO CSF  Final   Special Requests NONE  Final   Gram Stain   Final    MODERATE WBC PRESENT, PREDOMINANTLY MONONUCLEAR NO ORGANISMS SEEN CYTOSPIN SMEAR    Culture   Final    NO GROWTH < 24 HOURS Performed at [facility], [address], [place], [place] [address]    Report Status PENDING  Incomplete    Radiology Studies: DG FL GUIDED LUMBAR PUNCTURE Result Date: 07/24/2024 CLINICAL DATA:  63 year old female admitted with acute encephalopathy. IR was requested for diagnostic lumbar puncture. EXAM:  LUMBAR PUNCTURE UNDER FLUOROSCOPY PROCEDURE: An appropriate skin entry site was determined fluoroscopically. Operator donned sterile gloves and mask. Skin site was marked, then prepped with Betadine, draped in usual sterile fashion, and infiltrated locally with 1% lidocaine . A 20 gauge spinal needle advanced into the thecal sac at mid L4-L5 from a left interlaminar approach. Clear colorless CSF spontaneously returned, with opening pressure of 12 cm water . Eight ml CSF were collected and divided among 4 sterile vials for the requested laboratory studies. The needle was then removed. The patient tolerated the procedure well and there were no complications. FLUOROSCOPY: Radiation Exposure Index (as provided by the fluoroscopic device): 5.1 mGy Kerma IMPRESSION: Technically successful lumbar puncture under fluoroscopy. This exam was performed by Carlin Griffon, PA-C, and was supervised and interpreted by Dr. Jackquline Boxer, MD. Electronically Signed   By: Jackquline Boxer M.D.   On: 07/24/2024 13:41   Scheduled Meds:  atorvastatin   80 mg Per Tube QHS   Chlorhexidine  Gluconate Cloth  6 each Topical Daily   enoxaparin  (LOVENOX ) injection  80 mg Subcutaneous Q12H   feeding supplement (PROSource TF20)  60 mL Per Tube Daily   free water   200 mL Per Tube Q6H   insulin  aspart  0-15 Units Subcutaneous Q4H   insulin  aspart  4 Units Subcutaneous Q4H   insulin  glargine-yfgn  25 Units Subcutaneous BID   levETIRAcetam   500 mg Per Tube BID   lidocaine   1 patch Transdermal Daily   losartan   50 mg Per Tube Daily   phosphorus  250 mg Per Tube BID   potassium chloride   40 mEq Oral BID   propranolol   20 mg Per Tube TID   sodium chloride  flush  10-40 mL Intracatheter Q12H   thiamine   100 mg Per Tube Daily   Continuous Infusions:  feeding supplement (OSMOLITE 1.5 CAL) 45 mL/hr at 07/25/24 1132   This record has been created using Conservation officer, historic buildings. Errors have been sought and corrected,but may not  always be located. Such creation errors do not reflect on the standard  of care.   Time spent.  50-minute  Amaryllis Dare, MD [facility] 07/25/2024, 2:51 PM

## 2024-07-25 NOTE — Plan of Care (Signed)
  Problem: Skin Integrity: Goal: Risk for impaired skin integrity will decrease Outcome: Not Progressing   Problem: Education: Goal: Knowledge of General Education information will improve Description: Including pain rating scale, medication(s)/side effects and non-pharmacologic comfort measures Outcome: Not Progressing   Problem: Activity: Goal: Risk for activity intolerance will decrease Outcome: Not Progressing   Problem: Nutrition: Goal: Adequate nutrition will be maintained Outcome: Not Progressing

## 2024-07-26 DIAGNOSIS — F418 Other specified anxiety disorders: Secondary | ICD-10-CM | POA: Diagnosis not present

## 2024-07-26 DIAGNOSIS — J9611 Chronic respiratory failure with hypoxia: Secondary | ICD-10-CM | POA: Diagnosis not present

## 2024-07-26 DIAGNOSIS — N390 Urinary tract infection, site not specified: Secondary | ICD-10-CM | POA: Diagnosis not present

## 2024-07-26 DIAGNOSIS — G9341 Metabolic encephalopathy: Secondary | ICD-10-CM | POA: Diagnosis not present

## 2024-07-26 LAB — CBC WITH DIFFERENTIAL/PLATELET
Abs Immature Granulocytes: 0.03 K/uL (ref 0.00–0.07)
Basophils Absolute: 0 K/uL (ref 0.0–0.1)
Basophils Relative: 0 %
Eosinophils Absolute: 0.2 K/uL (ref 0.0–0.5)
Eosinophils Relative: 2 %
HCT: 33.3 % — ABNORMAL LOW (ref 36.0–46.0)
Hemoglobin: 10.5 g/dL — ABNORMAL LOW (ref 12.0–15.0)
Immature Granulocytes: 0 %
Lymphocytes Relative: 11 %
Lymphs Abs: 1.3 K/uL (ref 0.7–4.0)
MCH: 27 pg (ref 26.0–34.0)
MCHC: 31.5 g/dL (ref 30.0–36.0)
MCV: 85.6 fL (ref 80.0–100.0)
Monocytes Absolute: 0.7 K/uL (ref 0.1–1.0)
Monocytes Relative: 6 %
Neutro Abs: 9.6 K/uL — ABNORMAL HIGH (ref 1.7–7.7)
Neutrophils Relative %: 81 %
Platelets: 166 K/uL (ref 150–400)
RBC: 3.89 MIL/uL (ref 3.87–5.11)
RDW: 16.3 % — ABNORMAL HIGH (ref 11.5–15.5)
WBC: 12 K/uL — ABNORMAL HIGH (ref 4.0–10.5)
nRBC: 0 % (ref 0.0–0.2)

## 2024-07-26 LAB — GLUCOSE, CAPILLARY
Glucose-Capillary: 135 mg/dL — ABNORMAL HIGH (ref 70–99)
Glucose-Capillary: 136 mg/dL — ABNORMAL HIGH (ref 70–99)
Glucose-Capillary: 148 mg/dL — ABNORMAL HIGH (ref 70–99)
Glucose-Capillary: 158 mg/dL — ABNORMAL HIGH (ref 70–99)
Glucose-Capillary: 164 mg/dL — ABNORMAL HIGH (ref 70–99)
Glucose-Capillary: 197 mg/dL — ABNORMAL HIGH (ref 70–99)
Glucose-Capillary: 210 mg/dL — ABNORMAL HIGH (ref 70–99)

## 2024-07-26 LAB — RENAL FUNCTION PANEL
Albumin: 2.6 g/dL — ABNORMAL LOW (ref 3.5–5.0)
Anion gap: 9 (ref 5–15)
BUN: 21 mg/dL (ref 8–23)
CO2: 31 mmol/L (ref 22–32)
Calcium: 9.1 mg/dL (ref 8.9–10.3)
Chloride: 102 mmol/L (ref 98–111)
Creatinine, Ser: 0.57 mg/dL (ref 0.44–1.00)
GFR, Estimated: >60 mL/min (ref >60)
Glucose, Bld: 147 mg/dL — ABNORMAL HIGH (ref 70–99)
Phosphorus: 3.2 mg/dL (ref 2.5–4.6)
Potassium: 4 mmol/L (ref 3.5–5.1)
Sodium: 142 mmol/L (ref 135–145)

## 2024-07-26 MED ORDER — LOPERAMIDE HCL 2 MG PO CAPS
2.0000 mg | ORAL_CAPSULE | ORAL | Status: DC | PRN
  Administered 2024-07-26 – 2024-07-31 (×3): 2 mg via ORAL
  Filled 2024-07-26 (×3): qty 1

## 2024-07-26 MED ORDER — BANATROL TF EN LIQD
60.0000 mL | Freq: Two times a day (BID) | ENTERAL | Status: DC
  Administered 2024-07-26 – 2024-07-29 (×7): 60 mL
  Filled 2024-07-26 (×7): qty 60

## 2024-07-26 NOTE — Progress Notes (Addendum)
 PROGRESS NOTE    Brenda [name]  FMW:[id number] DOB: [birth year] DOA: 07/15/2024 PCP: [facility]   Brief Narrative:  63 year old female with history of metastatic endometrial cancer, chronic cancer-related pain, hypertension, hyperlipidemia, diabetes mellitus, depression, anxiety, CVA, history of DVT and PE anticoagulated on Lovenox , OSA, chronic hypoxic respiratory failure on 2L via nasal cannula and recent hospitalization and discharged on 07/14/2024 for UTI treated with IV antibiotics followed by oral antibiotics on discharge presented with chills and weakness.  She was found to have UTI and was started on IV cefepime .  Unfortunately mental status worsened and she was transferred to [facility] for continuous EEG monitoring. Started on Keppra  11/16, mental status remains altered 11/19: Broad-spectrum antibiotics to cover CNS  11/20: Video EEG discontinued 11/21: Plan for LP, continue on broad-spectrum antibiotics for CNS coverage 11/22: S/p LP, meningitis/encephalitis panel negative, neurology discontinued antibiotics.  Worsening mental status per husband.  Not able to participate with any meaningful history and following only few commands, slow recovery from cefepime  versus paraneoplastic syndrome with her history of advanced malignancy. 11/23: No change in mentation, developed some loose stool, no pain but do have some leukocytosis, asked dietitian to make some changes to her formula as it might be related to tube feed.  Need to monitor closely as patient is high risk for developing C. difficile.  Patient with very poor prognosis.  Assessment & Plan:  Acute metabolic encephalopathy Subclinical seizures - Encephalopathy prior to admission thought to be secondary to UTI.  Started on cefepime  with worsening mental status.  On 11/15, stiffening of upper extremities, patient transferred to [facility] for continuous video EEG.  MRI brain on 11/15 is negative for acute findings.   Shows evolution of ischemic infarcts similar to before.   IV cefepime  could be culprit and this was discontinued. Husband  says when he called 911 pt was having similar symptoms of bilateral upper extremities movement and change in mentation Video EEG: 4 seizures noted on 11/16, keppra  given 11/16 night. Will continue on keppra  500 bid per neurology Continuous video EEG discontinued 11/20 with stable EEG. 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  UTI: Present on admission, failed p.o. antibiotics - Blood and urine cultures negative so far.  - Antibiotics were discontinued.  Low-grade temperature: Remained afebrile  Endometrial cancer with metastases Chronic cancer-related pain with opiate dependence.  Will continue fentanyl , oxy as needed for pain. Fentanyl  patch could be an option.  Will defer to palliative care. - Outpatient follow-up with oncology/Dr. Lonn.  Continue current pain management. - family requested second opinion at [facility].  Referral will be sent for outpatient  Hypomagnesemia, replace as needed.  Hypernatremia.  Improved, sodium at 142 today - Continue current  free water  through tube  Hypophosphatemia.  Improved with repletion  Hypokalemia.  Improved with repletion  Hyperthyroidism, TSH is 0.23,  free T4 level elevated 1.4.  Mild suppression of TSH.  Likely from ongoing illness.  Antithyroid medications are not indicated at this time.  Will add propranolol  to help with tremors.  Patient additionally hypertensive and tachycardic.  Chronic respiratory failure with hypoxia OSA - Respiratory status stable.  Continue supplemental oxygen .  Continue CPAP at night  History of PE and DVT - Continue with Lovenox   Diabetes mellitus type 2  - Continue with Lantus  and SSI  Anemia of chronic disease - From cancer.   Hemoglobin stable.  No  signs of bleeding.  Monitor intermittently  Depression/anxiety - Prozac , Remeron , Haldol  discontinued as discussed above.    History of CAD History of stroke Hyperlipidemia -Continue statin.  Not on aspirin  since she is on treatment dose anticoagulation Obesity class I - Outpatient follow-up  Physical deconditioning - Likely will need SNF pending her clinical course.  Nutrition: Started on cortrak feeding 11/19.  DVT prophylaxis: Therapeutic doses of Lovenox  Code Status: DNR Family Communication: Discussed with daughter on phone.  Disposition Plan: Status is:  inpatient because: Severity of illness  Consultants: Neuro, palliative care  Procedures: None  Antimicrobials: Cefepime  from 07/16/2024 -11/14, Meropenem  11/15- Vancomycin  11/19, ampicillin  11/19, acyclovir 11/19 All antibiotics stopped on 07/24/2024  Subjective: Patient with no change in mental status, intermittently moans, does not even open eyes.  No moaning or wincing when belly was pressed.  Objective: Vitals:   07/26/24 0058 07/26/24 0336 07/26/24 0751 07/26/24 1204  BP: (!) 100/54 (!) 118/103 127/67 (!) 137/58  Pulse: 66 76 91 83  Resp: 20  18 20   Temp:  (!) 97.5 F (36.4 C) 97.8 F (36.6 C) 98.3 F (36.8 C)  TempSrc:  Oral Oral Oral  SpO2: 100% 100% 100% 100%  Weight:      Height:        Intake/Output Summary (Last 24 hours) at 07/26/2024 1249 Last data filed at 07/26/2024 1136 Gross per 24 hour  Intake 2156.5 ml  Output --  Net 2156.5 ml   Filed Weights   07/22/24 0500 07/23/24 0706 07/24/24 0500  Weight: 80.1 kg 80.1 kg 80 kg    Examination: General.  Ill-appearing , minimally responsive lady, in no acute distress. Pulmonary.  Lungs clear bilaterally, normal respiratory effort. CV.  Regular rate and rhythm, no JVD, rub or murmur. Abdomen.  Soft, nontender, nondistended, BS positive. CNS.  Lethargic, not following any commands with intermittent moaning only,  spontaneously move extremities Extremities.  No edema, no cyanosis, pulses intact and symmetrical.  Data Reviewed: I have personally reviewed following labs and imaging studies  CBC: Recent Labs  Lab 07/21/24 1107 07/22/24 0833 07/23/24 0400 07/24/24 0508 07/25/24 0555 07/26/24 0925  WBC 9.4 7.0 8.6 8.0 8.5 12.0*  NEUTROABS 7.1 4.7 6.3 5.8  --  9.6*  HGB 11.6* 10.7* 10.7* 10.9* 10.8* 10.5*  HCT 36.5 33.9* 34.4* 34.4* 34.2* 33.3*  MCV 83.9 85.4 87.3 85.1 85.3 85.6  PLT 257 209 188 180 174 166   Basic Metabolic Panel: Recent Labs  Lab 07/20/24 1120 07/22/24 0833 07/23/24 0400 07/24/24 0508 07/25/24 0555 07/26/24 0925  NA 140 150* 146* 145 146* 142  K 4.3 3.6 3.5 3.5 3.2* 4.0  CL 108 113* 109 107 106 102  CO2 16* 26 25 29 30 31   GLUCOSE 288* 167* 308* 261* 196* 147*  BUN 8 15 16 15 15 21   CREATININE 0.90 0.77 0.68 0.60 0.49 0.57  CALCIUM  9.6 9.4 9.2 8.8* 8.7* 9.1  MG 1.4*  --  1.6* 1.9 1.8  --   PHOS  --   --  2.1* 2.3* 2.1* 3.2   GFR: Estimated Creatinine Clearance: 72.4 mL/min (by C-G formula based on SCr of 0.57 mg/dL). Liver Function Tests: Recent Labs  Lab 07/22/24 0833 07/26/24 0925  AST 19  --   ALT 18  --   ALKPHOS 57  --   BILITOT 0.2  --   PROT 6.5  --   ALBUMIN 2.8* 2.6*   No results for input(s): LIPASE, AMYLASE in the last 168 hours.  No results for input(s): AMMONIA in the last 168 hours.  Coagulation Profile: No results for input(s): INR, PROTIME in the last 168 hours.  Cardiac Enzymes: No results for input(s): CKTOTAL, CKMB, CKMBINDEX, TROPONINI in the last 168 hours. BNP (last 3 results) Recent Labs    05/22/24 0630  PROBNP 3,514.0*   HbA1C: No results for input(s): HGBA1C in the last 72 hours. CBG: Recent Labs  Lab 07/25/24 2003 07/26/24 0043 07/26/24 0334 07/26/24 0749 07/26/24 1201  GLUCAP 196* 158* 164* 135* 210*   Lipid Profile: No results for input(s): CHOL, HDL, LDLCALC, TRIG, CHOLHDL,  LDLDIRECT in the last 72 hours. Thyroid  Function Tests: No results for input(s): TSH, T4TOTAL, FREET4, T3FREE, THYROIDAB in the last 72 hours.  Anemia Panel: No results for input(s): VITAMINB12, FOLATE, FERRITIN, TIBC, IRON, RETICCTPCT in the last 72 hours.  Sepsis Labs: No results for input(s): PROCALCITON, LATICACIDVEN in the last 168 hours.   Recent Results (from the past 240 hours)  Culture, blood (Routine X 2) w Reflex to ID Panel     Status: None (Preliminary result)   Collection Time: 07/21/24 11:07 AM   Specimen: BLOOD RIGHT HAND  Result Value Ref Range Status   Specimen Description BLOOD RIGHT HAND  Final   Special Requests   Final    BOTTLES DRAWN AEROBIC AND ANAEROBIC Blood Culture results may not be optimal due to an inadequate volume of blood received in culture bottles   Culture   Final    NO GROWTH 4 DAYS Performed at [facility], [address], [place], [place] [address]    Report Status PENDING  Incomplete  Culture, blood (Routine X 2) w Reflex to ID Panel     Status: None (Preliminary result)   Collection Time: 07/21/24 11:07 AM   Specimen: BLOOD LEFT ARM  Result Value Ref Range Status   Specimen Description BLOOD LEFT ARM  Final   Special Requests   Final    BOTTLES DRAWN AEROBIC AND ANAEROBIC Blood Culture adequate volume   Culture   Final    NO GROWTH 4 DAYS Performed at [facility], [address] [place], [place] [address]    Report Status PENDING  Incomplete  CSF culture w Gram Stain     Status: None (Preliminary result)   Collection Time: 07/24/24  1:02 PM   Specimen: PATH Cytology CSF; Cerebrospinal Fluid  Result Value Ref Range Status   Specimen Description CYTO CSF  Final   Special Requests NONE  Final   Gram Stain   Final    MODERATE WBC PRESENT, PREDOMINANTLY MONONUCLEAR NO ORGANISMS SEEN CYTOSPIN SMEAR    Culture   Final    NO GROWTH 2 DAYS Performed at [facility], [address],  [place], [place] [address]    Report Status PENDING  Incomplete    Radiology Studies: DG FL GUIDED LUMBAR PUNCTURE Result Date: 07/24/2024 CLINICAL DATA:  63 year old female admitted with acute encephalopathy. IR was requested for diagnostic lumbar puncture. EXAM: LUMBAR PUNCTURE UNDER FLUOROSCOPY PROCEDURE: An appropriate skin entry site was determined fluoroscopically. Operator donned sterile gloves and mask. Skin site was marked, then prepped with Betadine, draped in usual sterile fashion, and infiltrated locally with 1% lidocaine . A 20 gauge spinal needle advanced into the thecal sac at mid L4-L5 from a left interlaminar approach. Clear colorless CSF spontaneously returned, with opening pressure of 12 cm water . Eight ml CSF were collected and divided among 4 sterile vials for the requested  laboratory studies. The needle was then removed. The patient tolerated the procedure well and there were no complications. FLUOROSCOPY: Radiation Exposure Index (as provided by the fluoroscopic device): 5.1 mGy Kerma IMPRESSION: Technically successful lumbar puncture under fluoroscopy. This exam was performed by Carlin Griffon, PA-C, and was supervised and interpreted by Dr. Jackquline Boxer, MD. Electronically Signed   By: Jackquline Boxer M.D.   On: 07/24/2024 13:41   Scheduled Meds:  atorvastatin   80 mg Per Tube QHS   Chlorhexidine  Gluconate Cloth  6 each Topical Daily   enoxaparin  (LOVENOX ) injection  80 mg Subcutaneous Q12H   feeding supplement (PROSource TF20)  60 mL Per Tube Daily   fiber supplement (BANATROL TF)  60 mL Per Tube BID   free water   200 mL Per Tube Q4H   insulin  aspart  0-15 Units Subcutaneous Q4H   insulin  aspart  4 Units Subcutaneous Q4H   insulin  glargine-yfgn  25 Units Subcutaneous BID   levETIRAcetam   500 mg Per Tube BID   lidocaine   1 patch Transdermal Daily   losartan   50 mg Per Tube Daily   phosphorus  250 mg Per Tube BID   propranolol   20 mg Per Tube TID   sodium chloride  flush   10-40 mL Intracatheter Q12H   Continuous Infusions:  feeding supplement (OSMOLITE 1.5 CAL) 45 mL/hr at 07/26/24 9165   This record has been created using Dragon voice recognition software. Errors have been sought and corrected,but may not always be located. Such creation errors do not reflect on the standard of care.   Time spent.  50 minute  Amaryllis Dare, MD [facility] 07/26/2024, 12:49 PM

## 2024-07-26 NOTE — Progress Notes (Signed)
 Brief Nutrition Note  Received secure chat from patient's RN about any changes to TF regimen as patient experiencing diarrhea.  Patient being followed by RD, last full note 11/19. Will continue current TF regimen but add on Banatrol to aid in bulking of stools.   INTERVENTION:  Continue tube feeding via cortrak: Osmolite 1.5 at 45 ml/h (1080 ml per day) Prosource TF20 60 ml 1x/d Free water : 200mL every 4 hours (per MD) Provides 1700 kcal, 88 gm protein, 823 ml free water  daily ( fee water  TF+flush)  Add Banatrol fiber supplement BID, each provides 45 kcals, 2g protein and 5g soluble fiber to aid diarrhea.  Monitor mentation for ability to take in a PO diet  Monitor GOC discussions   Ernie Kasler Debby RD, LDN Contact via Secure Chat.

## 2024-07-26 NOTE — Plan of Care (Signed)
  Problem: Education: Goal: Knowledge of General Education information will improve Description: Including pain rating scale, medication(s)/side effects and non-pharmacologic comfort measures Outcome: Not Progressing   Problem: Activity: Goal: Risk for activity intolerance will decrease Outcome: Not Progressing   Problem: Pain Managment: Goal: General experience of comfort will improve and/or be controlled Outcome: Not Progressing

## 2024-07-27 DIAGNOSIS — F418 Other specified anxiety disorders: Secondary | ICD-10-CM | POA: Diagnosis not present

## 2024-07-27 DIAGNOSIS — N390 Urinary tract infection, site not specified: Secondary | ICD-10-CM | POA: Diagnosis not present

## 2024-07-27 DIAGNOSIS — G9341 Metabolic encephalopathy: Secondary | ICD-10-CM | POA: Diagnosis not present

## 2024-07-27 DIAGNOSIS — Z515 Encounter for palliative care: Secondary | ICD-10-CM | POA: Diagnosis not present

## 2024-07-27 DIAGNOSIS — Z66 Do not resuscitate: Secondary | ICD-10-CM | POA: Diagnosis not present

## 2024-07-27 DIAGNOSIS — Z7189 Other specified counseling: Secondary | ICD-10-CM | POA: Diagnosis not present

## 2024-07-27 LAB — CSF CULTURE W GRAM STAIN: Culture: NO GROWTH

## 2024-07-27 LAB — CULTURE, BLOOD (ROUTINE X 2)
Culture: NO GROWTH
Culture: NO GROWTH
Special Requests: ADEQUATE

## 2024-07-27 LAB — CBC
HCT: 31.6 % — ABNORMAL LOW (ref 36.0–46.0)
Hemoglobin: 10 g/dL — ABNORMAL LOW (ref 12.0–15.0)
MCH: 27 pg (ref 26.0–34.0)
MCHC: 31.6 g/dL (ref 30.0–36.0)
MCV: 85.2 fL (ref 80.0–100.0)
Platelets: 166 K/uL (ref 150–400)
RBC: 3.71 MIL/uL — ABNORMAL LOW (ref 3.87–5.11)
RDW: 16 % — ABNORMAL HIGH (ref 11.5–15.5)
WBC: 10.9 K/uL — ABNORMAL HIGH (ref 4.0–10.5)
nRBC: 0 % (ref 0.0–0.2)

## 2024-07-27 LAB — GLUCOSE, CAPILLARY
Glucose-Capillary: 126 mg/dL — ABNORMAL HIGH (ref 70–99)
Glucose-Capillary: 131 mg/dL — ABNORMAL HIGH (ref 70–99)
Glucose-Capillary: 132 mg/dL — ABNORMAL HIGH (ref 70–99)
Glucose-Capillary: 137 mg/dL — ABNORMAL HIGH (ref 70–99)
Glucose-Capillary: 147 mg/dL — ABNORMAL HIGH (ref 70–99)
Glucose-Capillary: 186 mg/dL — ABNORMAL HIGH (ref 70–99)
Glucose-Capillary: 201 mg/dL — ABNORMAL HIGH (ref 70–99)

## 2024-07-27 LAB — BASIC METABOLIC PANEL WITH GFR
Anion gap: 9 (ref 5–15)
BUN: 22 mg/dL (ref 8–23)
CO2: 31 mmol/L (ref 22–32)
Calcium: 9 mg/dL (ref 8.9–10.3)
Chloride: 100 mmol/L (ref 98–111)
Creatinine, Ser: 0.42 mg/dL — ABNORMAL LOW (ref 0.44–1.00)
GFR, Estimated: >60 mL/min (ref >60)
Glucose, Bld: 109 mg/dL — ABNORMAL HIGH (ref 70–99)
Potassium: 3.7 mmol/L (ref 3.5–5.1)
Sodium: 140 mmol/L (ref 135–145)

## 2024-07-27 NOTE — Progress Notes (Signed)
 PROGRESS NOTE    Brenda [name]  FMW:[id number] DOB: 1961/07/23 DOA: 07/15/2024 PCP: [facility]   Brief Narrative:  62 year old female with history of metastatic endometrial cancer, chronic cancer-related pain, hypertension, hyperlipidemia, diabetes mellitus, depression, anxiety, CVA, history of DVT and PE anticoagulated on Lovenox , OSA, chronic hypoxic respiratory failure on 2L via nasal cannula and recent hospitalization and discharged on 07/14/2024 for UTI treated with IV antibiotics followed by oral antibiotics on discharge presented with chills and weakness.  She was found to have UTI and was started on IV cefepime .  Unfortunately mental status worsened and she was transferred to [facility] for continuous EEG monitoring. Started on Keppra  11/16, mental status remains altered 11/19: Broad-spectrum antibiotics to cover CNS  11/20: Video EEG discontinued 11/21: Plan for LP, continue on broad-spectrum antibiotics for CNS coverage 11/22: S/p LP, meningitis/encephalitis panel negative, neurology discontinued antibiotics.  Worsening mental status per husband.  Not able to participate with any meaningful history and following only few commands, slow recovery from cefepime  versus paraneoplastic syndrome with her history of advanced malignancy. 11/23: No change in mentation, developed some loose stool, no pain but do have some leukocytosis, asked dietitian to make some changes to her formula as it might be related to tube feed.  Need to monitor closely as patient is high risk for developing C. difficile.  11/24: No change in mentation, diarrhea improving.  Discussed with her oncologist and requested to see the patient nothing much to offer from oncology, there were advising palliative care-another message sent to palliative to reengage.  Daughter understand well but husband seems like still in denial.  Patient with very poor prognosis.  Assessment & Plan:  Acute metabolic  encephalopathy Subclinical seizures - Encephalopathy prior to admission thought to be secondary to UTI.  Started on cefepime  with worsening mental status.  On 11/15, stiffening of upper extremities, patient transferred to [facility] for continuous video EEG.  MRI brain on 11/15 is negative for acute findings.  Shows evolution of ischemic infarcts similar to before.   IV cefepime  could be culprit and this was discontinued. Husband  says when he called 911 pt was having similar symptoms of bilateral upper extremities movement and change in mentation Video EEG: 4 seizures noted on 11/16, keppra  given 11/16 night. Will continue on keppra  500 bid per neurology Continuous video EEG discontinued 11/20 with stable EEG. 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 - Requested oncology to visit her  UTI: Present on admission, failed p.o. antibiotics - Blood and urine cultures negative so far.  - Antibiotics were discontinued.  Low-grade temperature: Remained afebrile  Endometrial cancer with metastases Chronic cancer-related pain with opiate dependence.  Will continue fentanyl , oxy as needed for pain. Fentanyl  patch could be an option.  Will defer to palliative care. - Outpatient follow-up with oncology/Dr. Lonn.  Continue current pain management. - family requested second opinion at [facility].  Referral will be sent for outpatient  Hypomagnesemia, replace as needed.  Hypernatremia.  Improved, sodium at 142 today - Continue current  free water  through tube  Hypophosphatemia.  Improved with repletion  Hypokalemia.  Improved with repletion  Hyperthyroidism, TSH is 0.23,  free T4 level elevated 1.4.  Mild suppression of TSH.  Likely from ongoing illness.  Antithyroid medications are not indicated at this time.  Will add propranolol  to help with tremors.   Patient additionally hypertensive and tachycardic.  Chronic  respiratory failure with hypoxia OSA - Respiratory status stable.  Continue supplemental oxygen .  Continue CPAP at night  History of PE and DVT - Continue with Lovenox   Diabetes mellitus type 2  - Continue with Lantus  and SSI  Anemia of chronic disease - From cancer.  Hemoglobin stable.  No signs of bleeding.  Monitor intermittently  Depression/anxiety - Prozac , Remeron , Haldol  discontinued as discussed above.    History of CAD History of stroke Hyperlipidemia -Continue statin.  Not on aspirin  since she is on treatment dose anticoagulation Obesity class I - Outpatient follow-up  Physical deconditioning - Likely will need SNF pending her clinical course.  Nutrition: Started on cortrak feeding 11/19.  DVT prophylaxis: Therapeutic doses of Lovenox  Code Status: DNR Family Communication: Discussed with daughter at bedside.  Disposition Plan: Status is:  inpatient because: Severity of illness  Consultants: Neuro, palliative care  Procedures: None  Antimicrobials: Cefepime  from 07/16/2024 -11/14, Meropenem  11/15- Vancomycin  11/19, ampicillin  11/19, acyclovir 11/19 All antibiotics stopped on 07/24/2024  Subjective: Patient with no change in mental status, not even opening eyes, not following any commands.  Objective: Vitals:   07/26/24 2325 07/27/24 0401 07/27/24 0747 07/27/24 1231  BP: 137/81 (!) 144/61 116/61 (!) 153/138  Pulse: (!) 101 (!) 105 (!) 104 79  Resp: 18 18 18 18   Temp: 98.6 F (37 C) 97.8 F (36.6 C) 98.4 F (36.9 C) 98.7 F (37.1 C)  TempSrc: Oral Oral Oral Oral  SpO2: 100% 100% 100% 100%  Weight:      Height:        Intake/Output Summary (Last 24 hours) at 07/27/2024 1425 Last data filed at 07/27/2024 0348 Gross per 24 hour  Intake 810 ml  Output 750 ml  Net 60 ml   Filed Weights   07/22/24 0500 07/23/24 0706 07/24/24 0500  Weight: 80.1 kg 80.1 kg 80 kg     Examination: General.  Frail and unresponsive lady, in no acute distress. Pulmonary.  Lungs clear bilaterally, normal respiratory effort. CV.  Regular rate and rhythm, no JVD, rub or murmur. Abdomen.  Soft, nontender, nondistended, BS positive. CNS.  Eyes closed and not following any commands, intermittent moaning Extremities.  No edema, pulses intact and symmetrical.  Data Reviewed: I have personally reviewed following labs and imaging studies  CBC: Recent Labs  Lab 07/21/24 1107 07/22/24 0833 07/23/24 0400 07/24/24 0508 07/25/24 0555 07/26/24 0925 07/27/24 0219  WBC 9.4 7.0 8.6 8.0 8.5 12.0* 10.9*  NEUTROABS 7.1 4.7 6.3 5.8  --  9.6*  --   HGB 11.6* 10.7* 10.7* 10.9* 10.8* 10.5* 10.0*  HCT 36.5 33.9* 34.4* 34.4* 34.2* 33.3* 31.6*  MCV 83.9 85.4 87.3 85.1 85.3 85.6 85.2  PLT 257 209 188 180 174 166 166   Basic Metabolic Panel: Recent Labs  Lab 07/23/24 0400 07/24/24 0508 07/25/24 0555 07/26/24 0925 07/27/24 0219  NA 146* 145 146* 142 140  K 3.5 3.5 3.2* 4.0 3.7  CL 109 107 106 102 100  CO2 25 29 30 31 31   GLUCOSE 308* 261* 196* 147* 109*  BUN 16 15 15 21 22   CREATININE 0.68 0.60 0.49 0.57 0.42*  CALCIUM  9.2 8.8* 8.7* 9.1 9.0  MG 1.6* 1.9 1.8  --   --   PHOS 2.1* 2.3* 2.1* 3.2  --    GFR: Estimated Creatinine Clearance: 72.4 mL/min (A) (by C-G formula based on SCr of 0.42 mg/dL (L)). Liver Function Tests: Recent Labs  Lab 07/22/24 0833 07/26/24 0925  AST 19  --  ALT 18  --   ALKPHOS 57  --   BILITOT 0.2  --   PROT 6.5  --   ALBUMIN 2.8* 2.6*   No results for input(s): LIPASE, AMYLASE in the last 168 hours. No results for input(s): AMMONIA in the last 168 hours.  Coagulation Profile: No results for input(s): INR, PROTIME in the last 168 hours.  Cardiac Enzymes: No results for input(s): CKTOTAL, CKMB, CKMBINDEX, TROPONINI in the last 168 hours. BNP (last 3 results) Recent Labs    05/22/24 0630  PROBNP 3,514.0*    HbA1C: No results for input(s): HGBA1C in the last 72 hours. CBG: Recent Labs  Lab 07/26/24 1952 07/26/24 2325 07/27/24 0400 07/27/24 0747 07/27/24 1230  GLUCAP 148* 136* 131* 147* 201*   Lipid Profile: No results for input(s): CHOL, HDL, LDLCALC, TRIG, CHOLHDL, LDLDIRECT in the last 72 hours. Thyroid  Function Tests: No results for input(s): TSH, T4TOTAL, FREET4, T3FREE, THYROIDAB in the last 72 hours.  Anemia Panel: No results for input(s): VITAMINB12, FOLATE, FERRITIN, TIBC, IRON, RETICCTPCT in the last 72 hours.  Sepsis Labs: No results for input(s): PROCALCITON, LATICACIDVEN in the last 168 hours.   Recent Results (from the past 240 hours)  Culture, blood (Routine X 2) w Reflex to ID Panel     Status: None   Collection Time: 07/21/24 11:07 AM   Specimen: BLOOD RIGHT HAND  Result Value Ref Range Status   Specimen Description BLOOD RIGHT HAND  Final   Special Requests   Final    BOTTLES DRAWN AEROBIC AND ANAEROBIC Blood Culture results may not be optimal due to an inadequate volume of blood received in culture bottles   Culture   Final    NO GROWTH 6 DAYS Performed at [facility], [address] [place], [place] [address]    Report Status 07/27/2024 FINAL  Final  Culture, blood (Routine X 2) w Reflex to ID Panel     Status: None   Collection Time: 07/21/24 11:07 AM   Specimen: BLOOD LEFT ARM  Result Value Ref Range Status   Specimen Description BLOOD LEFT ARM  Final   Special Requests   Final    BOTTLES DRAWN AEROBIC AND ANAEROBIC Blood Culture adequate volume   Culture   Final    NO GROWTH 6 DAYS Performed at [facility], [address] [place], [place] [address]    Report Status 07/27/2024 FINAL  Final  CSF culture w Gram Stain     Status: None   Collection Time: 07/24/24  1:02 PM   Specimen: PATH Cytology CSF; Cerebrospinal Fluid  Result Value Ref Range Status   Specimen Description CYTO CSF  Final    Special Requests NONE  Final   Gram Stain   Final    MODERATE WBC PRESENT, PREDOMINANTLY MONONUCLEAR NO ORGANISMS SEEN CYTOSPIN SMEAR    Culture   Final    NO GROWTH 3 DAYS Performed at [facility], [address], [place], [place] [address]    Report Status 07/27/2024 FINAL  Final    Radiology Studies: No results found.  Scheduled Meds:  atorvastatin   80 mg Per Tube QHS   Chlorhexidine  Gluconate Cloth  6 each Topical Daily   enoxaparin  (LOVENOX ) injection  80 mg Subcutaneous Q12H   feeding supplement (PROSource TF20)  60 mL Per Tube Daily   fiber supplement (BANATROL TF)  60 mL Per Tube BID   free water   200 mL Per Tube Q4H  insulin  aspart  0-15 Units Subcutaneous Q4H   insulin  aspart  4 Units Subcutaneous Q4H   insulin  glargine-yfgn  25 Units Subcutaneous BID   levETIRAcetam   500 mg Per Tube BID   lidocaine   1 patch Transdermal Daily   losartan   50 mg Per Tube Daily   phosphorus  250 mg Per Tube BID   propranolol   20 mg Per Tube TID   sodium chloride  flush  10-40 mL Intracatheter Q12H   Continuous Infusions:  feeding supplement (OSMOLITE 1.5 CAL) 1,000 mL (07/26/24 2022)   This record has been created using Conservation officer, historic buildings. Errors have been sought and corrected,but may not always be located. Such creation errors do not reflect on the standard of care.   Time spent.  50 minute  Amaryllis Dare, MD [facility] 07/27/2024, 2:25 PM

## 2024-07-27 NOTE — Progress Notes (Signed)
 Daily Progress Note   Date: 07/27/2024   Patient Name: Brenda [name]  DOB: 1960-11-10  MRN: [id number]  Age / Sex: 63 y.o., female  Attending Physician: Brenda Qualia, MD Primary Care Physician: [facility] Admit Date: 07/15/2024 Length of Stay: 10 days  Reason for Follow-up: Establishing goals of care  Past Medical History:  Diagnosis Date   Anemia    Arthritis    back, hands, hips (02/22/2015)   CAD (coronary artery disease)    a. complex LAD/diagonal bifurcation PCI in 2010. b. STEMI 10/2019 s/p PTCA/DES x1 to mLAD overlapping the old stent, residual disease treated medicaly.   Cancer Brenda [name])    uterine   CHF (congestive heart failure) (HCC)    Depression    Diabetes mellitus (HCC)    started when I was pregnant; not sure if it was type 1 or type 2    History of radiation therapy    Endometrium- HDR 01/22/22-03/07/22- Dr. Lynwood [name]   Hypercholesterolemia    Hypertension    Morbid obesity (HCC)    Myocardial infarction (HCC)    mild x 3   Neuromuscular disorder (HCC)    neuropathy feet   Sleep apnea    cpap/    Assessment & Plan:   HPI/Patient Profile:  63 y.o. 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   Palliative medicine consulted for goals of care conversation.   SUMMARY OF RECOMMENDATIONS DNR-limited Will consider scheduling analgesic if family is agreeable due to increased about of PRN use in the last 24 hours Open to all aggressive medical interventions including temporary artificial feeding tubes, family clear patient would not want to be prolonged interventions if she is unable to have a meaningful recovery Continue to monitor for improvement or deterioration for further discussion points with family Plan to have a family meeting this week  Symptom  Management:  Per primary team  Code Status: DNR - Limited (DNR/DNI)  Prognosis: Unable to determine  Discharge Planning: To Be Determined  Discussed with: Caleen MD about LENGTHY discussion with patient's husband to assess his understanding of the current situation and his views on the patient's trajectory.   Subjective:   Subjective: Chart Reviewed. Updates received. Patient Assessed. Created space and opportunity for patient  and family to explore thoughts and feelings regarding current medical situation.  Patient received fentanyl  50 mcg PRN x 8 in the last 24 hours. Patient received haldol  2 mg PRN x2. Patient received oxycodone  5 mg x1.   Today's Discussion: Met with patient with daughter and husband bedside twice at separate times.   Initial meeting with daughter: Daughter discussed that she was able to share some more information with her 8 year old son after information provided by social work. She shared that he still needed some time to process things as she is slowly preparing him for when she may no longer be around. Daughter expressed that she was not sure where her father is at in terms of understanding the overall trajectory of the patient's health. She has been hesitant to discuss end of life with him as she is unsure how he will react. I shared that I can broach the subject with him but that I think it is also time to have a family meeting with everyone present so that people can have an honest and open discussion. She was  agreeable to meeting with the rest of her family.   Second meeting with husband: Discussed with Brenda [name] (husband) about the patient's overall trajectory and his hopes and expectations going forward. Had a LENGTHY discussion with husband. He shared his concern that the medical team has been ignoring his observation that the patient's mental status improved after the patient received empiric ampicillin /vanc for possible meningitis. After the  lumbar puncture  came back clear, the antibiotics were stopped and her mentation worsened along with it. He was also unhappy that the medical team dismissed his observation and his requests to restart those antibiotics along with the current meropenem . I share that I would discuss that topic with the primary team and neurology.   Moved the meeting outside of the patient room at the husband shared that he does not want to speak about poor prognosis in front of the patient. Initiated conversation with asking what his thoughts are on how things are going and after a long pause his initial response was Why does that matter?. He shares that his thoughts have no bearing on how things are going. I shared that it is still important for me to know to help me assess where he is in this grieving process and where his frame of mind is. His daughters expressed concern about how he would handle discussing end of life, but patient was appropriately tearful multiple times throughout the meeting. He shared how he met his wife, the life they lived and multiple other stories discussing how much trauma and how unfair life has been for his wife and his children.  He was finally able to share that he does not think that things are going well and that the current trajectory is not sustainable. He knows that she is deteriorating and that eventually she will pass with the current trajectory. He wants to do his best to honor her wishes prior to losing consciousness and that was she was going to continue to fight her metastatic cancer. He also shared that his wife asked that her mother Brenda [name]) and her eldest daughter Brenda [name]) not make a decision to discontinue aggressive medical interventions to prolong her life. He shared that Brenda [name] is very pragmatic and analytical and that if given the chance she would choose to transition to comfort measures. He shared that the patient's mother withdrew life support from the patient's sister to let her pass.  He shared that he is currently pursuing aggressive measures right now because that was what his wife asked him prior to losing consciousness and he will continue to do so. He knows that she is suffering right now but not ready to transition to full comfort measures. He is hopeful that things will improve in some way. I asked what if the patient remains in this current mental state the rest of her life, what would she want and he shared that she would not want to be in this state if she cannot interact with the world. But even at this point he does not feel that she has lost ability to interact and is hoping for her to recover enough to help guide his decisions. He would be okay if she were able to wake up and express that she wants to stop current medical interventions to prolong her life, but he wants to hear it from her if possible.   Family is agreeable to a meeting in person to discuss further goals of care.   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  HENT:     Head: Normocephalic.     Nose: Nose normal.  Cardiovascular:     Rate and Rhythm: Normal rate.  Pulmonary:     Effort: Pulmonary effort is normal.  Abdominal:     Palpations: Abdomen is soft.  Skin:    General: Skin is warm.    Palliative Assessment/Data: 10%   Existing Vynca/ACP  Documentation: None  Thank you for allowing us  to participate in the care of Nadirah Socorro PMT will continue to support holistically.  I personally spent a total of 75 minutes in the care of the patient today including preparing to see the patient, getting/reviewing separately obtained history, performing a medically appropriate exam/evaluation, counseling and educating, referring and communicating with other health care professionals, and documenting clinical information in the EHR.   [name]  Palliative Medicine Team  Team Phone # [phone] (Nights/Weekends) 07/27/2024 5:41 PM

## 2024-07-28 ENCOUNTER — Inpatient Hospital Stay: Admitting: Hematology and Oncology

## 2024-07-28 ENCOUNTER — Inpatient Hospital Stay

## 2024-07-28 ENCOUNTER — Ambulatory Visit

## 2024-07-28 ENCOUNTER — Inpatient Hospital Stay: Admitting: Dietician

## 2024-07-28 DIAGNOSIS — Z515 Encounter for palliative care: Secondary | ICD-10-CM | POA: Diagnosis not present

## 2024-07-28 DIAGNOSIS — Z66 Do not resuscitate: Secondary | ICD-10-CM | POA: Diagnosis not present

## 2024-07-28 DIAGNOSIS — G9341 Metabolic encephalopathy: Secondary | ICD-10-CM | POA: Diagnosis not present

## 2024-07-28 DIAGNOSIS — I2510 Atherosclerotic heart disease of native coronary artery without angina pectoris: Secondary | ICD-10-CM | POA: Diagnosis not present

## 2024-07-28 DIAGNOSIS — F418 Other specified anxiety disorders: Secondary | ICD-10-CM | POA: Diagnosis not present

## 2024-07-28 DIAGNOSIS — Z7189 Other specified counseling: Secondary | ICD-10-CM | POA: Diagnosis not present

## 2024-07-28 LAB — GLUCOSE, CAPILLARY
Glucose-Capillary: 139 mg/dL — ABNORMAL HIGH (ref 70–99)
Glucose-Capillary: 135 mg/dL — ABNORMAL HIGH (ref 70–99)
Glucose-Capillary: 146 mg/dL — ABNORMAL HIGH (ref 70–99)
Glucose-Capillary: 164 mg/dL — ABNORMAL HIGH (ref 70–99)
Glucose-Capillary: 133 mg/dL — ABNORMAL HIGH (ref 70–99)

## 2024-07-28 NOTE — Plan of Care (Signed)

## 2024-07-28 NOTE — Progress Notes (Addendum)
 Speech Language Pathology Treatment: Dysphagia  Patient Details Name: Brenda [name] MRN: [id number] DOB: Oct 14, 1960 Today's Date: 07/28/2024 Time: 8958-8897 SLP Time Calculation (min) (ACUTE ONLY): 21 min  Assessment / Plan / Recommendation Clinical Impression  Pt seen with daughters, husband and Palliative care arrived during session to determine current status and any improvement. [name] eyes are open halfway but did not respond to commands, with SLP with decreased awareness however did vocalize uh once to husband when stimulated. Family states she has periods of more wakeful states that fluctuate. Oral care attempted with pt closing teeth only able to clean lateral sulci, teeth without access to tongue likely becoming somewhat averse to oral care. Pt closing teeth on wet spoon and eventually opened mouth to allow spoon in halfway with minimal labial closure. Able to take in one ice chip and close mouth with slight movement noted x 1 however no functional attempts to manipulate with cues from therapist or husband and no pharyngeal swallow observed. SLP suctioned small amount from oral cavity. Unfortunately she has been unable to demonstrate functional or significant improvements over past three sessions. ST will sign off at this time and discussed with family, Palliative care NP and MD who arrived. Recommend continue oral care with nursing and family (as comfortable) to decreased bacterial load and provide moisture. Family having family meeting today with Palliative care and requested to reconsult if condition improves. Asked if husband had any questions and he repeatedly stated thank you and pointed to the door.    HPI HPI: Brenda [name] is a 63 y.o. 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  She presents to [facility]da [name], ED with reports of chills and weakness preventing her from ambulating.  Note she was recently discharged from [facility] where she was treated for a UTI and discharged on cefadroxil . Dx with recurrent UTI secondary to failed antibiotic attempt. MRI 11/15 revealed Similar evolving infarcts in the anterior and posterior circulation.      SLP Plan  Other (Comment);Discharge SLP treatment due to (comment) (no improvements noted over prior 2 sessions)          Recommendations  Diet recommendations: NPO (unable to tolerate ice chips currently) Medication Administration: Via alternative means                  Oral care QID   Frequent or constant Supervision/Assistance Dysphagia, oropharyngeal phase (R13.12)     Other (Comment);Discharge SLP treatment due to (comment) (no improvements noted over prior 2 sessions)     Dustin Olam Bull  07/28/2024, 12:22 PM

## 2024-07-28 NOTE — Progress Notes (Signed)
 Occupational Therapy Treatment Patient Details Name: Brenda [name] MRN: [id number] DOB: 21-May-1961 Today's Date: 07/28/2024   History of present illness 63 yo female who returned to hospital on 07/15/2024 with ongoing signs and symptoms of UTI with generalized weakness. Pt with 4 seizures on EEG 11/16. Working diagnosis of cefepime  neurotoxicity. Prior admit 11/9 with urinary symptoms and AMS d/c home on OP ABX. PMH includes but is not limited to: HTN, HLD, DM, depression/anxiety, CVA (CIR recently), DVT/PE, OSA on CPAP, chronic respiratory failure on 2 L/min at baseline, and metastatic endometrial cancer with chronic cancer-related pain.   OT comments  Pt seen for OT tx today. Multiple family members at bedside, supportive. Pt with eyes closed for entire duration of session. Head turn preference to the R. Remains with R>L UE tremors at rest, incr tone in LUE>RUE. OT performed PROM BUE for improved muscle tone/prevent contractures. B palms with malodorous smell likely due to perspiration, edu family on importance of fingernail and hand sanitation to prevent infection. PRAFOs donned at end of session, switched kickstand to achieve better neutral hip alignment. Daughter reports AM palliative mtg with family today. Family ok with OT following peripherally pending further medical decisions re: goals of care. Will con't to follow.      If plan is discharge home, recommend the following:  Two people to help with walking and/or transfers;Two people to help with bathing/dressing/bathroom;Assistance with cooking/housework;Assistance with feeding;Direct supervision/assist for medications management;Direct supervision/assist for financial management;Assist for transportation;Help with stairs or ramp for entrance;Supervision due to cognitive status   Equipment Recommendations  Hospital bed;Hoyer lift    Recommendations for Other Services      Precautions / Restrictions Precautions Precautions:  Fall;Other (comment) (skin integrity) Recall of Precautions/Restrictions: Impaired Precaution/Restrictions Comments: Cortrak, B medaboots Restrictions Weight Bearing Restrictions Per Provider Order: No       Mobility Bed Mobility                    Transfers                         Balance                                           ADL either performed or assessed with clinical judgement   ADL Overall ADL's : Needs assistance/impaired Eating/Feeding: NPO       Upper Body Bathing: Total assistance Upper Body Bathing Details (indicate cue type and reason): assist to wash B hands (odorous from perspiration and intermittent incr tone in B hands/wrists)                                Extremity/Trunk Assessment              Vision       Perception     Praxis     Communication Communication Communication: Impaired Factors Affecting Communication: Difficulty expressing self (mostly non-verbally communicative during session)   Cognition Arousal: Stuporous Behavior During Therapy: Flat affect Cognition: Cognition impaired   Orientation impairments:  (does not respond to name, suspect behavioral component)     Attention impairment (select first level of impairment): Focused attention   OT - Cognition Comments: resting in bed, does not follow commands, stated one word during therapy session in response to pain  Following commands: Impaired Following commands impaired: Follows one step commands inconsistently (< 10% of the time)      Cueing   Cueing Techniques: Verbal cues, Tactile cues  Exercises General Exercises - Upper Extremity Shoulder Flexion: PROM, Both, 10 reps, Supine Elbow Flexion: PROM, Both, 10 reps, Supine Elbow Extension: PROM, Both, 10 reps, Supine Wrist Flexion: PROM, Both, 10 reps, Supine Composite Extension: PROM, Both, 10 reps, Supine    Shoulder Instructions        General Comments one vocalization during session - pt stating yes when asked if her L hand hurt d/t visible grimacing; supportive family at bedside    Pertinent Vitals/ Pain       Pain Assessment Pain Assessment: Faces Faces Pain Scale: Hurts a little bit Pain Location: L hand with stretching/movement Pain Descriptors / Indicators: Grimacing, Guarding Pain Intervention(s): Limited activity within patient's tolerance, Monitored during session  Home Living                                          Prior Functioning/Environment              Frequency  Min 1X/week        Progress Toward Goals  OT Goals(current goals can now be found in the care plan section)  Progress towards OT goals:  (regression due to mental status)     Plan      Co-evaluation                 AM-PAC OT 6 Clicks Daily Activity     Outcome Measure   Help from another person eating meals?: Total Help from another person taking care of personal grooming?: Total Help from another person toileting, which includes using toliet, bedpan, or urinal?: Total Help from another person bathing (including washing, rinsing, drying)?: Total Help from another person to put on and taking off regular upper body clothing?: Total Help from another person to put on and taking off regular lower body clothing?: Total 6 Click Score: 6    End of Session    OT Visit Diagnosis: Muscle weakness (generalized) (M62.81);Low vision, both eyes (H54.2);Feeding difficulties (R63.3);Cognitive communication deficit (R41.841) Symptoms and signs involving cognitive functions: Cerebral infarction   Activity Tolerance Patient limited by lethargy   Patient Left in bed;with call bell/phone within reach;with bed alarm set;with family/visitor present (medaboots donned at end of session with hips in neutral alignment)   Nurse Communication          Time: 8784-8771 OT Time Calculation (min): 13  min  Charges: OT General Charges $OT Visit: 1 Visit OT Treatments $Therapeutic Exercise: 8-22 mins  Brenda [name], OTR/L [facility] [phone] Secure Chat Preferred  Brenda [name] 07/28/2024, 1:11 PM

## 2024-07-28 NOTE — Progress Notes (Signed)
 Physical Therapy Treatment Patient Details Name: Brenda [name] MRN: [id number] DOB: [birth year] Today's Date: 07/28/2024   History of Present Illness 63 yo female who returned to hospital on 07/15/2024 with ongoing signs and symptoms of UTI with generalized weakness. Pt with 4 seizures on EEG 11/16. Working diagnosis of cefepime  neurotoxicity. Prior admit 11/9 with urinary symptoms and AMS d/c home on OP ABX. PMH includes but is not limited to: HTN, HLD, DM, depression/anxiety, CVA (CIR recently), DVT/PE, OSA on CPAP, chronic respiratory failure on 2 L/min at baseline, and metastatic endometrial cancer with chronic cancer-related pain.    PT Comments  Pt received with eyes open, rolling with totalA x2 for pericare. However, did not note either eye tracking motion, with gaze largely fixated to the L. Continuing to note tone (L>R) in B UE and LE, as well as tremor noted in B forearms (R>L). PROM performed for LE and UE, as well as family education regarding maintaining ROM and donning and doffing PRAFO boots. Following pericare, pt largely stuporous and unresponsive except during PROM of L knee, where she was able to state yes when asked if the motion hurt.  Continuing to recommend post-acute rehab <3hrs/day to improve activity tolerance and functional mobility. Acute PT to follow.    If plan is discharge home, recommend the following: Two people to help with walking and/or transfers;Two people to help with bathing/dressing/bathroom;Assistance with cooking/housework;Assistance with feeding;Direct supervision/assist for medications management;Direct supervision/assist for financial management;Assist for transportation;Supervision due to cognitive status   Can travel by private vehicle     No  Equipment Recommendations  Hospital bed;Hoyer lift;Other (comment) Visual Merchandiser)    Recommendations for Other Services       Precautions / Restrictions Precautions Precautions: Fall;Other  (comment) Recall of Precautions/Restrictions: Impaired Precaution/Restrictions Comments: Seizure, Cortrak, cEEG Restrictions Weight Bearing Restrictions Per Provider Order: No     Mobility  Bed Mobility Overal bed mobility: Needs Assistance Bed Mobility: Rolling Rolling: Total assist, +2 for physical assistance         General bed mobility comments: Rolling bilaterally for pericare and pressure relief. Placed pillow under pt's R side for pressure relief upon leaving room    Transfers                        Ambulation/Gait                   Stairs             Wheelchair Mobility     Tilt Bed    Modified Rankin (Stroke Patients Only)       Balance                                            Communication Communication Communication: Impaired Factors Affecting Communication: Difficulty expressing self  Cognition Arousal: Stuporous Behavior During Therapy: Flat affect   PT - Cognitive impairments: Difficult to assess Difficult to assess due to: Level of arousal                     PT - Cognition Comments: During bed mobility for pericare, noted open eyes which did not track to movement. After this was completed, pt asleep and largely stuporous. Following commands: Impaired Following commands impaired: Follows one step commands inconsistently    Cueing Cueing Techniques: Verbal cues, Gestural cues,  Tactile cues  Exercises General Exercises - Upper Extremity Shoulder Flexion: PROM, Both, 10 reps, Supine Elbow Flexion: PROM, Both, 10 reps, Supine Elbow Extension: PROM, Both, 10 reps, Supine Wrist Flexion: PROM, Both, 10 reps, Supine Wrist Extension: PROM, Both, 10 reps, Supine Digit Composite Flexion: PROM, Both, 10 reps, Supine Composite Extension: PROM, Both, 10 reps, Supine General Exercises - Lower Extremity Ankle Circles/Pumps: PROM, Both, 10 reps, Supine Quad Sets: PROM, Both, 10 reps, Supine Heel  Slides: PROM, Both, 10 reps, Supine Other Exercises Other Exercises: Hip IR; 10 reps; PROM; supine Other Exercises: L cervical rotation; supine; PROM; 10 sec hold x2 Other Exercises: L cervical lateral flexion; 10 sec hold x 2; PROM    General Comments General comments (skin integrity, edema, etc.): Noted single vocalization during PROM today, with pt groaning during PROM and when asked if it hurt, she was able to verbalize yes      Pertinent Vitals/Pain Pain Assessment Pain Assessment: Faces Faces Pain Scale: No hurt Breathing: occasional labored breathing, short period of hyperventilation Negative Vocalization: occasional moan/groan, low speech, negative/disapproving quality Facial Expression: sad, frightened, frown Body Language: rigid, fists clenched, knees up, pushing/pulling away, strikes out Consolability: unable to console, distract or reassure PAINAD Score: 7 Pain Intervention(s): Limited activity within patient's tolerance, Monitored during session    Home Living                          Prior Function            PT Goals (current goals can now be found in the care plan section) Acute Rehab PT Goals PT Goal Formulation: With family Time For Goal Achievement: 07/31/24 Potential to Achieve Goals: Fair Progress towards PT goals: Not progressing toward goals - comment    Frequency    Min 1X/week      PT Plan      Co-evaluation              AM-PAC PT 6 Clicks Mobility   Outcome Measure  Help needed turning from your back to your side while in a flat bed without using bedrails?: Total Help needed moving from lying on your back to sitting on the side of a flat bed without using bedrails?: Total Help needed moving to and from a bed to a chair (including a wheelchair)?: Total Help needed standing up from a chair using your arms (e.g., wheelchair or bedside chair)?: Total Help needed to walk in hospital room?: Total Help needed climbing 3-5  steps with a railing? : Total 6 Click Score: 6    End of Session   Activity Tolerance: Other (comment) (Limited due to cognition) Patient left: in bed;with call bell/phone within reach;with family/visitor present Nurse Communication: Mobility status PT Visit Diagnosis: Other abnormalities of gait and mobility (R26.89);Muscle weakness (generalized) (M62.81);Unsteadiness on feet (R26.81);History of falling (Z91.81)     Time: 9070-8996 PT Time Calculation (min) (ACUTE ONLY): 34 min  Charges:    $Therapeutic Exercise: 8-22 mins $Therapeutic Activity: 8-22 mins PT General Charges $$ ACUTE PT VISIT: 1 Visit                     Layla Gramm, SPT    Davey Limas 07/28/2024, 11:05 AM

## 2024-07-28 NOTE — Progress Notes (Signed)
 Daily Progress Note   Date: 07/28/2024   Patient Name: Brenda [name]  DOB: 08-24-1961  MRN: [id number]  Age / Sex: 63 y.o., female  Attending Physician: Caleen Qualia, MD Primary Care Physician: [facility] Admit Date: 07/15/2024 Length of Stay: 11 days  Reason for Follow-up: Establishing goals of care  Past Medical History:  Diagnosis Date   Anemia    Arthritis    back, hands, hips (02/22/2015)   CAD (coronary artery disease)    a. complex LAD/diagonal bifurcation PCI in 2010. b. STEMI 10/2019 s/p PTCA/DES x1 to mLAD overlapping the old stent, residual disease treated medicaly.   Cancer [facility])    uterine   CHF (congestive heart failure) (HCC)    Depression    Diabetes mellitus (HCC)    started when I was pregnant; not sure if it was type 1 or type 2    History of radiation therapy    Endometrium- HDR 01/22/22-03/07/22- Dr. Lynwood Nasuti   Hypercholesterolemia    Hypertension    Morbid obesity (HCC)    Myocardial infarction (HCC)    mild x 3   Neuromuscular disorder (HCC)    neuropathy feet   Sleep apnea    cpap/    Assessment & Plan:   HPI/Patient Profile:  63 y.o. 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   Palliative medicine consulted for goals of care conversation.   SUMMARY OF RECOMMENDATIONS DNR-limited Open to all aggressive medical interventions including temporary artificial feeding tubes, family clear patient would not want to be prolonged interventions if she is unable to have a meaningful recovery Continue to monitor for improvement or deterioration for further discussion points with family Family requesting transfer to tertiary hospital for second opinion Family requesting change of hospitalist attending  Symptom Management:  Per primary team  Code  Status: DNR - Limited (DNR/DNI)  Prognosis: Unable to determine  Discharge Planning: To Be Determined  Discussed with: Caleen MD about family requests to transfer to either [facility], [facility], or [facility]. Also communicated with patient's outpatient oncologist Gorsuch MD about family's desire to transfer service.   Subjective:   Subjective: Chart Reviewed. Updates received. Patient Assessed. Created space and opportunity for patient  and family to explore thoughts and feelings regarding current medical situation.  Today's Discussion:  Met with patient at the bedside while speech therapy was assessing her ability to eat. Later joined by primary attending while waiting for speech therapy to visit. Moved meeting with family to a different room away from the patient for further discussion.   Joined by Lavanda (daughter), [name] (daughter), and [name] (husband) and patient's sister Curtiss) by phone.   Initiated with asking the family their current views on how the hospital course has gone for patient. They expressed that they were frustrated with how she has been managed and concerned about the quality of care being provided here. They expressed that their concerns and observations have been dismissed, including transfer request to a tertiary hospital, continued upper extremity tremors that the patient is having as well as the concern for discontinuation of ampicillin  and vancomycin  that was empirically started for suspected bacterial meningitis on 07/22/2024-07/24/2024 after the patient showed improvement in her mentation. Family feels that they are being ignored. I reassured them that I will work on getting their concerns addressed.   Family all shared that they  know things have not been going well, but that they do not believe there have been adequate attempts to help the patient improve meaningfully in the time that she has been here.   Lavanda shared that she wants continued interventions to  figure out why the patient's mentation has been so poor. But she also knows that once the family receives an adequate explanation as well as a second opinion from a tertiary hospital, they may be more inclined consider comfort measures going forward. I shared that the medical team is unsure at this time but there are a multiple factors that can cause her poor mentation including her prior strokes, progressive cancer, meningioma on CT scan, medication used to suppress the seizures, and overall poor nutritional status.   [name] shared that her wishes are to continue medical interventions but knows that this may not end well and is also desiring a possible transfer. But she knows and has been requesting information on hospice and skilled nursing facility in order to plan for possible transition to comfort measures  [name] shared that he wants to continue to advocate for his wife to get better care at a tertiary hospital as well. I inquired what his thoughts would be on next steps if the tertiary hospital also came to the same conclusion that the patient's current medical team is sharing with the family and he said he would be open to transitioning the patient to comfort measures.   Overall, family does not trust the medical team at [facility] at present and is requesting a transfer if possible and a change in hospitalist. I reiterated that I will do my best to communicate. I will also discuss with the primary attending of the family's wishes and discuss a change in hospitalist. I did notify them that the current attending will no longer be on service tomorrow.   Family did ask for some anticipatory guidance going forward and I posited to them that in the next week or two we will need to discuss a more permanent feeding tube if things do not improve.   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     Comments: Obtunded, but more alert today and able to open eyes partially.   HENT:     Head: Normocephalic.     Nose: Nose normal.     Mouth/Throat:     Mouth: Mucous membranes are dry.  Cardiovascular:     Rate and Rhythm: Normal rate.  Pulmonary:     Effort: Pulmonary effort is normal.     Comments: On 2 L/min [place] Abdominal:     Palpations: Abdomen is soft.  Skin:    General: Skin is warm.    Palliative Assessment/Data: 10%   Existing Vynca/ACP Documentation: None  Thank you  for allowing us  to participate in the care of Whitley Patchen PMT will continue to support holistically.  I personally spent a total of 50 minutes in the care of the patient today including preparing to see the patient, getting/reviewing separately obtained history, performing a medically appropriate exam/evaluation, counseling and educating, referring and communicating with other health care professionals, and documenting clinical information in the EHR.  [name]  Palliative Medicine Team  Team Phone # [phone] (Nights/Weekends) 07/28/2024 1:06 PM

## 2024-07-28 NOTE — Progress Notes (Signed)
 Nutrition Follow-up  DOCUMENTATION CODES:  Non-severe (moderate) malnutrition in context of chronic illness  INTERVENTION:  Continue tube feeding via cortrak: Osmolite 1.5 at 45 ml/h (1080 ml per day) Prosource TF20 60 ml 1x/d Free water : 200mL every 4 hours per MD Provides 1700 kcal, 88 gm protein, 822 ml free water  daily ( fee water  TF+flush) Banatrol, increase to TID - provides 45kcal, 5g soluble fiber and 2g protein per serving. Monitor mentation for ability to take in a PO diet Monitor GOC discussions  NUTRITION DIAGNOSIS:  Moderate Malnutrition related to chronic illness (cancer) as evidenced by percent weight loss, mild muscle depletion, moderate muscle depletion (12% x 6 months). - remains applicable  GOAL:  Patient will meet greater than or equal to 90% of their needs - being addressed with tube feeds  MONITOR:  Weight trends, TF tolerance, I & O's, Labs, Diet advancement  REASON FOR ASSESSMENT:  Consult Enteral/tube feeding initiation and management  ASSESSMENT:  Pt with hx of HTN, HLD, CAD, CHF, hx CVA, uterine cancer s/p hysterectomy and adjuvant radiation therapy in 2023 with recurrance with mets in July 2025, hx MI x 3, and DM type 2 presented to [facility] ED with weakness after a recent admission from 11/9-11/11. Pt had seizures during admission and was transferred to [facility] for continuous EEG.  11/13 - presented to [facility] with chills and weakness after recent admission to [facility] 11/15 - transferred to [facility] 11/19 - cortrak placed (gastric)  Pt sleeping soundly at the time of assessment, husband at bedside. Discussed TF regimen and nutrition during this admission. Husband confirms that the fiber did seem to thicken stool after it was added this weekend. Labs stable and CBGs in good range at this time. Will add an additional packet of banatrol to see if adds more bulk as type 6 stools are still being reported (3x yesterday).  Did  discuss with husband that cortrak has been in for 1 week with no improvement in ability to take in PO. Did mention that there is no timeframe when tube has to be removed but that typically if tube is still needed, nutrition team recommends consider a transition to a PEG after about 3 weeks for higher level of patient comfort. Also did discuss that although PEG would be more permanent it does not necessarily have to stay in place or be used indefinitely. No other questions at this time from husband. Will continue to monitor and adjust as needed.   Admit weight: 77.9 kg Current weight: 79.4 kg   12% weight loss noted in the last 6 months (since cancer recurrence) which is severe for timeframe  Nutritionally Relevant Medications: Scheduled Meds:  atorvastatin   80 mg Per Tube QHS   PROSource TF20  60 mL Per Tube Daily   BANATROL TF  60 mL Per Tube BID   free water   200 mL Per Tube Q4H   insulin  aspart  0-15 Units Subcutaneous Q4H   insulin  aspart  4 Units Subcutaneous Q4H   insulin  glargine-yfgn  25 Units Subcutaneous BID   Continuous Infusions:  feeding supplement (OSMOLITE 1.5 CAL) 1,000 mL (07/28/24 2052)   PRN Meds: loperamide , ondansetron , prochlorperazine , senna-docusate  Labs Reviewed: CBG ranges from 126-201 mg/dL over the last 24 hours HgbA1c 7.2% (06/01/24)  NUTRITION - FOCUSED PHYSICAL EXAM: Flowsheet Row Most Recent Value  Orbital Region No depletion  Upper Arm Region No depletion  Thoracic and Lumbar Region No depletion  Buccal Region No depletion  Temple Region Mild depletion  Clavicle Bone Region Mild depletion  Clavicle and Acromion Bone Region Mild depletion  Scapular Bone Region Mild depletion  Dorsal Hand No depletion  Patellar Region Moderate depletion  Anterior Thigh Region Moderate depletion  Posterior Calf Region Moderate depletion  Edema (RD Assessment) Moderate  [BLE, BUE]  Hair Reviewed  Eyes Reviewed  Mouth Reviewed  Skin Reviewed  Nails Reviewed     Diet Order:   Diet Order             Diet NPO time specified  Diet effective now                   EDUCATION NEEDS:  Education needs have been addressed  Skin:  Skin Assessment: Reviewed RN Assessment  Last BM:  11/26 - type 6  Height:  Ht Readings from Last 1 Encounters:  07/16/24 5' 2 (1.575 m)    Weight:  Wt Readings from Last 1 Encounters:  07/29/24 79.4 kg    Ideal Body Weight:  50 kg  BMI:  Body mass index is 32.01 kg/m.  Estimated Nutritional Needs:  Kcal:  1600-1800 kcal/d Protein:  75-90 g/d Fluid:  >/=1.6L/d    Vernell Lukes, RD, LDN, CNSC Registered Dietitian II Please reach out via secure chat

## 2024-07-29 LAB — RENAL FUNCTION PANEL
Albumin: 2.2 g/dL — ABNORMAL LOW (ref 3.5–5.0)
Anion gap: 8 (ref 5–15)
BUN: 19 mg/dL (ref 8–23)
CO2: 27 mmol/L (ref 22–32)
Calcium: 8.6 mg/dL — ABNORMAL LOW (ref 8.9–10.3)
Chloride: 101 mmol/L (ref 98–111)
Creatinine, Ser: 0.59 mg/dL (ref 0.44–1.00)
GFR, Estimated: >60 mL/min (ref >60)
Glucose, Bld: 190 mg/dL — ABNORMAL HIGH (ref 70–99)
Phosphorus: 4.3 mg/dL (ref 2.5–4.6)
Potassium: 4.2 mmol/L (ref 3.5–5.1)
Sodium: 136 mmol/L (ref 135–145)

## 2024-07-29 LAB — GLUCOSE, CAPILLARY
Glucose-Capillary: 127 mg/dL — ABNORMAL HIGH (ref 70–99)
Glucose-Capillary: 147 mg/dL — ABNORMAL HIGH (ref 70–99)
Glucose-Capillary: 149 mg/dL — ABNORMAL HIGH (ref 70–99)
Glucose-Capillary: 164 mg/dL — ABNORMAL HIGH (ref 70–99)
Glucose-Capillary: 166 mg/dL — ABNORMAL HIGH (ref 70–99)
Glucose-Capillary: 168 mg/dL — ABNORMAL HIGH (ref 70–99)

## 2024-07-29 LAB — CBC
HCT: 26.1 % — ABNORMAL LOW (ref 36.0–46.0)
Hemoglobin: 8.4 g/dL — ABNORMAL LOW (ref 12.0–15.0)
MCH: 27.2 pg (ref 26.0–34.0)
MCHC: 32.2 g/dL (ref 30.0–36.0)
MCV: 84.5 fL (ref 80.0–100.0)
Platelets: 206 K/uL (ref 150–400)
RBC: 3.09 MIL/uL — ABNORMAL LOW (ref 3.87–5.11)
RDW: 16.5 % — ABNORMAL HIGH (ref 11.5–15.5)
WBC: 10.7 K/uL — ABNORMAL HIGH (ref 4.0–10.5)
nRBC: 0 % (ref 0.0–0.2)

## 2024-07-29 LAB — MAGNESIUM: Magnesium: 2 mg/dL (ref 1.7–2.4)

## 2024-07-29 MED ORDER — FREE WATER
200.0000 mL | Freq: Four times a day (QID) | Status: DC
  Administered 2024-07-29 – 2024-08-02 (×15): 200 mL

## 2024-07-29 MED ORDER — BANATROL TF EN LIQD
60.0000 mL | Freq: Three times a day (TID) | ENTERAL | Status: DC
  Administered 2024-07-29 – 2024-08-04 (×18): 60 mL
  Filled 2024-07-29 (×18): qty 60

## 2024-07-29 NOTE — Hospital Course (Addendum)
 Brief Narrative:  63 year old with history of metastatic endometrial cancer, chronic pain, DM 2, HTN, HLD, OSA, CAD, CHF, CVA, DVT/PE on Lovenox , chronic hypoxia on 2 L nasal cannula was recently hospitalized at [facility] on 11/9 - 11/11 for UTI treated with antibiotics discharged on p.o. presented back again to the hospital due to worsening mental status.  Eventually transferred to [facility] and EEG showed concerns of seizures therefore started on Keppra .  Also received empiric antibiotics for concerns of meningitis/encephalitis but CSF panel was negative therefore discontinued.  Assessment & Plan:  Acute metabolic encephalopathy Seizures Patient undergone extensive workup.  Seen by neurology and oncology.  Overall infectious etiologies were ruled out therefore antibiotics were eventually discontinued.  MRI brain showed concerns of evolving infarct.  There was also concerns of neurotoxicity secondary to Keytruda  therefore was discontinued. -Status post PLEX x 5 days completed Dec 12/28 -Status post high-dose steroids 12/9 - 12/11 -Now on Keppra  500 mg twice daily - Repeat EEG unremarkable - Seen by neurology again, considering IVIG  Attempted to transfer her to [facility] on 1/16 for second opinion, rejected.  Called [facility] today, they are uptodate that ptn needs neg pressure airborne room. They have also updated their providers. Remains on transfer list.   Left scalp ring lesion Vesicular rash around left back/axilla Concerns of tinea corporis therefore started terbinafine  topical for 2 weeks, EOT 1/29 - Discussed with ID regarding possible disseminated VZV therefore empirically started antiviral.  ID consulted Anxiety -Improved after starting BuSpar  5 mg p.o. twice daily - Continue Ativan  1 mg IV every 4 hours as needed    Metastatic Endometrial Cancer Cancer Related Pain  Seen by oncology and palliative care. She had had multiple imagings done during this hospitalization.  Per  oncology note, there is no clear evidence of progression of the metastatic carcinoma. Keytruda  on hold as explained above. Pain regimen --- Scheduled: Oxycodone  5 mg every 8 hours. Reduced to BID today. --- PRN: IV Dilaudid , oxycodone , Tylenol     Recent DVT/PE,Splenic Infarct In October 2025, patient was diagnosed to have right leg DVT, PE as well as a splenic infarct and she was started on anticoagulation.  This hospitalization, anticoagulation was held due to psoas hematoma and GI bleed.   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   Patient had BRBPR during this hospitalization BRBPR resolved at this time.  CT GI bleed negative.  Required PRBC transfusion during hospitalization.  Therapeutic Lovenox  changed to prophylaxis to Lovenox    Recurrent UTI, Klebsiella Completed 5 days of IV Rocephin  1/10   Dysphagia Moderate malnutrition -Placed PEG tube by IR on 1/6   Type 2 diabetes mellitus A1c 7.2.  On long-acting and sliding scale.  Adjust as necessary    Hypertension IV as needed     Depression/anxiety Prozac  discontinued due to rigidity -Continue Ativan  as needed   H/o CAD, Stroke HLD Currently on Lipitor  80 mg daily.  Anticoagulation on hold as discussed above   Abnormal Thyroid  Function Tests Initially low TSH and elevated free T4, was started on propranolol .  Repeat levels have normalized   Vaginal discharge, persist Received a dose of Diflucan  1/12, will give 1 additional dose of Diflucan    Hypomagnesemia As needed repletion   DVT prophylaxis: Lovenox     Code Status: Limited: Do not attempt resuscitation (DNR) -DNR-LIMITED -Do Not Intubate/DNI  Family Communication:  Status is: Inpatient We are still pending transfer to [facility]   PT Follow up Recs: Skilled Nursing-Short Term Rehab (<3 Hours/Day)09/09/2024 1500  Subjective: Patient  seen at bedside this morning she is resting comfortably.  Also spoke with the daughter who was getting ready to leave.  No complaints at this time  Examination:  General exam: Appears calm and comfortable  Respiratory system: Clear to auscultation. Respiratory effort normal. Cardiovascular system: S1 & S2 heard, RRR. No JVD, murmurs, rubs, gallops or clicks. No pedal edema. Gastrointestinal system: Abdomen is nondistended, soft and nontender. No organomegaly or masses felt. Normal bowel sounds heard. Central nervous system: Difficult to assess Extremities: Symmetric 4 x 5 power. Skin: Left scalp ring lesion Psychiatry: Judgement and insight appear poor PEG tube Right-sided chest wall Chemo-Port Foley catheter

## 2024-07-29 NOTE — TOC Progression Note (Signed)
 Transition of Care [facility]) - Progression Note    Patient Details  Name: Brenda [name] MRN: [id number] Date of Birth: April 05, 1961  Transition of Care [facility]) CM/SW Contact  Andrez JULIANNA George, RN Phone Number: 07/29/2024, 2:08 PM  Clinical Narrative:     Pt continues with cortrak. Palliative care working with family. IP Care management following.  Expected Discharge Plan:  (TBD) Barriers to Discharge: Continued Medical Work up               Expected Discharge Plan and Services                                               Social Drivers of Health (SDOH) Interventions SDOH Screenings   Food Insecurity: No Food Insecurity (07/16/2024)  Recent Concern: Food Insecurity - Food Insecurity Present (04/20/2024)  Housing: Low Risk  (07/16/2024)  Transportation Needs: No Transportation Needs (07/16/2024)  Utilities: Not At Risk (07/16/2024)  Depression (PHQ2-9): Low Risk  (06/30/2024)  Recent Concern: Depression (PHQ2-9) - Medium Risk (05/07/2024)  Financial Resource Strain: High Risk (02/10/2021)  Social Connections: Moderately Integrated (07/13/2024)  Tobacco Use: Medium Risk (07/16/2024)    Readmission Risk Interventions    07/14/2024    2:34 PM 05/04/2024   10:00 AM  Readmission Risk Prevention Plan  Transportation Screening Complete Complete  PCP or Specialist Appt within 3-5 Days  Complete  HRI or Home Care Consult  Complete  Social Work Consult for Recovery Care Planning/Counseling  Complete  Palliative Care Screening  Not Applicable  Medication Review Oceanographer) Complete Complete  PCP or Specialist appointment within 3-5 days of discharge Complete   HRI or Home Care Consult Complete   SW Recovery Care/Counseling Consult Complete   Palliative Care Screening Not Applicable   Skilled Nursing Facility Not Applicable

## 2024-07-29 NOTE — Progress Notes (Signed)
 PROGRESS NOTE    Brenda [name]  FMW:[id number] DOB: Feb 24, 1961 DOA: 07/15/2024 PCP: [facility]  Subjective: 63 year old female with history of metastatic endometrial cancer, chronic cancer-related pain, hypertension, hyperlipidemia, diabetes mellitus, depression, anxiety, CVA, history of DVT and PE anticoagulated on Lovenox , OSA, chronic hypoxic respiratory failure on 2L via nasal cannula and recent hospitalization and discharged on 07/14/2024 for UTI treated with IV antibiotics followed by oral antibiotics on discharge presented with chills and weakness.  She was found to have UTI and was started on IV cefepime .  Unfortunately mental status worsened and she was transferred to [facility] for continuous EEG monitoring. Started on Keppra  11/16.  11/19: Empiric broad-spectrum antibiotics to cover CNS  11/20: Video EEG discontinued 11/22: S/p LP, meningitis/encephalitis panel negative, neurology discontinued antibiotics.    Worsening mental status since antibiotics stoppedper husband. Not able to participate with any meaningful history and following only few commands.  11/23: No change in mentation, developed some loose stool, no pain but do have some leukocytosis, asked dietitian to make some changes to her formula as it might be related to tube feed.  Need to monitor closely as patient is high risk for developing C. difficile.   11/24: No change in mentation, diarrhea improving.  Discussed with her oncologist and requested to see the patient nothing much to offer from oncology and they advised palliative care   11/25: No change in mentation, minimally responsive.  Family meeting with palliative and they want her to be transferred to a tertiary care center.  Discussed with her oncologist as we do not have any diagnosis which cannot be treated at current facility, transfer will not happen for a second opinion.  Per oncology, family need to arrange themself to take her to any  tertiary care center of their choice , apparently has an outpatient appointment with [facility] oncology in December.   11/26: Long discussion with husband at bedside about extensive workup done that resulted inconclusive. Husband still hopeful the patient will improve in terms of mental status.    Assessment & Plan:   Acute metabolic encephalopathy Subclinical seizures Encephalopathy prior to admission thought to be secondary to UTI.  Started on cefepime  with worsening mental status.  On 11/15, stiffening of upper extremities, patient transferred to [facility] for continuous video EEG. MRI brain on 11/15 is negative for acute findings.  Shows evolution of ischemic infarcts similar to before.   - IV cefepime  could be culprit and this was discontinued. 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 - Will continue on keppra  500 bid  - as per oncology, no further recommendations - [facility] denied patient transfer with no bed availability  - advised patient to consider reaching out on his own to get second opinion at outside facility - palliative following    UTI:  Present on admission, failed p.o. antibiotics - Blood and urine cultures negative so far.  - Antibiotics were discontinued.   Endometrial cancer with metastases Chronic cancer-related pain with opiate dependence.  - Will continue fentanyl , oxy as needed for pain. Fentanyl  patch could be an option.  - Will defer to palliative care. - Outpatient follow-up with oncology/Dr. Lonn.   - family requested second opinion at [facility].  - Referral will be sent for outpatient   Hypernatremia.   Resolved with IV/PO hydration - decrease free water  flushes  -  cont hydration with tube feeds   Hypophosphatemia.  Improved with repletion   Hypokalemia.  Improved with repletion    Hyperthyroidism, TSH is 0.23,  free T4 level elevated 1.4.  Mild suppression of TSH.  Likely from ongoing illness.  Antithyroid medications are not indicated at this time.   - cont propranolol  to help with tremors.    Chronic respiratory failure with hypoxia OSA - Respiratory status stable.  Continue supplemental oxygen .  Continue CPAP at night   History of PE and DVT - Continue with Lovenox    Diabetes mellitus type 2  - Continue with Lantus  and SSI   Anemia of chronic disease - From cancer.  Hemoglobin stable.  No signs of bleeding.  Monitor intermittently   Depression/anxiety - Prozac , Remeron , Haldol  discontinued as discussed above.     History of CAD History of stroke Hyperlipidemia -Continue statin.  Not on aspirin  since she is on treatment dose anticoagulation  Obesity class I - Outpatient follow-up   Physical deconditioning - Likely will need SNF pending her clinical course.   Nutrition: Started on cortrak feeding 11/19.  DVT prophylaxis:   Lovenox    Code Status: Limited: Do not attempt resuscitation (DNR) -DNR-LIMITED -Do Not Intubate/DNI  Family Communication: Spoke with husband at bedside Disposition Plan: TBD, likely SNF Reason for continuing need for hospitalization: Severity of illness  Objective: Vitals:   07/29/24 0334 07/29/24 0430 07/29/24 0808 07/29/24 1118  BP: (!) 108/51  (!) 106/58 116/68  Pulse: 73  73 (!) 59  Resp: 17  16 16   Temp: 98.7 F (37.1 C)  98.3 F (36.8 C) 98 F (36.7 C)  TempSrc: Oral  Oral Oral  SpO2: 100%  100% 100%  Weight:  79.4 kg    Height:        Intake/Output Summary (Last 24 hours) at 07/29/2024 1544 Last data filed at 07/29/2024 1243 Gross per 24 hour  Intake 1210 ml  Output 900 ml  Net 310 ml   Filed Weights   07/24/24 0500 07/28/24 0441 07/29/24 0430  Weight: 80 kg 81.2 kg 79.4 kg    Examination:  Physical Exam  Data Reviewed: I have personally reviewed following labs and imaging  studies  CBC: Recent Labs  Lab 07/23/24 0400 07/24/24 0508 07/25/24 0555 07/26/24 0925 07/27/24 0219 07/29/24 0430  WBC 8.6 8.0 8.5 12.0* 10.9* 10.7*  NEUTROABS 6.3 5.8  --  9.6*  --   --   HGB 10.7* 10.9* 10.8* 10.5* 10.0* 8.4*  HCT 34.4* 34.4* 34.2* 33.3* 31.6* 26.1*  MCV 87.3 85.1 85.3 85.6 85.2 84.5  PLT 188 180 174 166 166 206   Basic Metabolic Panel: Recent Labs  Lab 07/23/24 0400 07/24/24 0508 07/25/24 0555 07/26/24 0925 07/27/24 0219 07/29/24 0430  NA 146* 145 146* 142 140 136  K 3.5 3.5 3.2* 4.0 3.7 4.2  CL 109 107 106 102 100 101  CO2 25 29 30 31 31 27   GLUCOSE 308* 261* 196* 147* 109* 190*  BUN 16 15 15 21 22 19   CREATININE 0.68 0.60 0.49 0.57 0.42* 0.59  CALCIUM  9.2 8.8* 8.7* 9.1 9.0 8.6*  MG 1.6* 1.9 1.8  --   --  2.0  PHOS 2.1* 2.3* 2.1* 3.2  --  4.3   GFR: Estimated Creatinine Clearance: 72 mL/min (by C-G formula based on SCr of 0.59 mg/dL). Liver Function Tests: Recent Labs  Lab 07/26/24 0925 07/29/24 0430  ALBUMIN 2.6* 2.2*   No results for input(s): LIPASE, AMYLASE in the last  168 hours. No results for input(s): AMMONIA in the last 168 hours. Coagulation Profile: No results for input(s): INR, PROTIME in the last 168 hours. Cardiac Enzymes: No results for input(s): CKTOTAL, CKMB, CKMBINDEX, TROPONINI in the last 168 hours. ProBNP, BNP (last 5 results) Recent Labs    05/22/24 0630 06/19/24 1730  PROBNP 3,514.0*  --   BNP  --  161.6*   HbA1C: No results for input(s): HGBA1C in the last 72 hours. CBG: Recent Labs  Lab 07/28/24 2041 07/29/24 0046 07/29/24 0353 07/29/24 0807 07/29/24 1117  GLUCAP 133* 164* 168* 166* 127*   Lipid Profile: No results for input(s): CHOL, HDL, LDLCALC, TRIG, CHOLHDL, LDLDIRECT in the last 72 hours. Thyroid  Function Tests: No results for input(s): TSH, T4TOTAL, FREET4, T3FREE, THYROIDAB in the last 72 hours. Anemia Panel: No results for input(s):  VITAMINB12, FOLATE, FERRITIN, TIBC, IRON, RETICCTPCT in the last 72 hours. Sepsis Labs: No results for input(s): PROCALCITON, LATICACIDVEN in the last 168 hours.  Recent Results (from the past 240 hours)  Culture, blood (Routine X 2) w Reflex to ID Panel     Status: None   Collection Time: 07/21/24 11:07 AM   Specimen: BLOOD RIGHT HAND  Result Value Ref Range Status   Specimen Description BLOOD RIGHT HAND  Final   Special Requests   Final    BOTTLES DRAWN AEROBIC AND ANAEROBIC Blood Culture results may not be optimal due to an inadequate volume of blood received in culture bottles   Culture   Final    NO GROWTH 6 DAYS Performed at [facility], [address], [place], [place] [address]    Report Status 07/27/2024 FINAL  Final  Culture, blood (Routine X 2) w Reflex to ID Panel     Status: None   Collection Time: 07/21/24 11:07 AM   Specimen: BLOOD LEFT ARM  Result Value Ref Range Status   Specimen Description BLOOD LEFT ARM  Final   Special Requests   Final    BOTTLES DRAWN AEROBIC AND ANAEROBIC Blood Culture adequate volume   Culture   Final    NO GROWTH 6 DAYS Performed at [facility], [address] [place], [place] [address]    Report Status 07/27/2024 FINAL  Final  CSF culture w Gram Stain     Status: None   Collection Time: 07/24/24  1:02 PM   Specimen: PATH Cytology CSF; Cerebrospinal Fluid  Result Value Ref Range Status   Specimen Description CYTO CSF  Final   Special Requests NONE  Final   Gram Stain   Final    MODERATE WBC PRESENT, PREDOMINANTLY MONONUCLEAR NO ORGANISMS SEEN CYTOSPIN SMEAR    Culture   Final    NO GROWTH 3 DAYS Performed at [facility], [address] [place], [place] [address]    Report Status 07/27/2024 FINAL  Final     Radiology Studies: No results found.  Scheduled Meds:  atorvastatin   80 mg Per Tube QHS   Chlorhexidine  Gluconate Cloth  6 each Topical Daily   enoxaparin  (LOVENOX ) injection  80 mg  Subcutaneous Q12H   feeding supplement (PROSource TF20)  60 mL Per Tube Daily   fiber supplement (BANATROL TF)  60 mL Per Tube TID   free water   200 mL Per Tube Q6H   insulin  aspart  0-15 Units Subcutaneous Q4H   insulin  aspart  4 Units Subcutaneous Q4H   insulin  glargine-yfgn  25 Units Subcutaneous BID   levETIRAcetam   500  mg Per Tube BID   lidocaine   1 patch Transdermal Daily   losartan   50 mg Per Tube Daily   propranolol   20 mg Per Tube TID   sodium chloride  flush  10-40 mL Intracatheter Q12H   Continuous Infusions:  feeding supplement (OSMOLITE 1.5 CAL) 1,000 mL (07/28/24 2052)     LOS: 12 days   Time spent: 75 minutes  Norval Bar, MD  [facility]  07/29/2024, 3:44 PM

## 2024-07-29 NOTE — Plan of Care (Signed)

## 2024-07-29 NOTE — Progress Notes (Signed)
 PROGRESS NOTE    Brenda [name]  FMW:[id number] DOB: [birth year] DOA: 07/15/2024 PCP: [facility]   Brief Narrative:  63 year old female with history of metastatic endometrial cancer, chronic cancer-related pain, hypertension, hyperlipidemia, diabetes mellitus, depression, anxiety, CVA, history of DVT and PE anticoagulated on Lovenox , OSA, chronic hypoxic respiratory failure on 2L via nasal cannula and recent hospitalization and discharged on 07/14/2024 for UTI treated with IV antibiotics followed by oral antibiotics on discharge presented with chills and weakness.  She was found to have UTI and was started on IV cefepime .  Unfortunately mental status worsened and she was transferred to [facility] for continuous EEG monitoring. Started on Keppra  11/16, mental status remains altered 11/19: Broad-spectrum antibiotics to cover CNS  11/20: Video EEG discontinued 11/21: Plan for LP, continue on broad-spectrum antibiotics for CNS coverage 11/22: S/p LP, meningitis/encephalitis panel negative, neurology discontinued antibiotics.  Worsening mental status per husband.  Not able to participate with any meaningful history and following only few commands, slow recovery from cefepime  versus paraneoplastic syndrome with her history of advanced malignancy. 11/23: No change in mentation, developed some loose stool, no pain but do have some leukocytosis, asked dietitian to make some changes to her formula as it might be related to tube feed.  Need to monitor closely as patient is high risk for developing C. difficile.  11/24: No change in mentation, diarrhea improving.  Discussed with her oncologist and requested to see the patient nothing much to offer from oncology, there were advising palliative care-another message sent to palliative to reengage.  Daughter understand well but husband seems like still in denial.  11/25: No change in mentation, minimally responsive.  Family meeting with  palliative today and they want her to be transferred to a tertiary care center.  Discussed with her oncologist as we do not have any diagnosis which cannot be treated at current facility, transfer will not happen for a second opinion.  Per oncology family need to arrange themself to take her to any tertiary care center of their choice , apparently has an outpatient appointment with [facility] oncology in December.  Patient with very poor prognosis.  Assessment & Plan:  Acute metabolic encephalopathy Subclinical seizures - Encephalopathy prior to admission thought to be secondary to UTI.  Started on cefepime  with worsening mental status.  On 11/15, stiffening of upper extremities, patient transferred to [facility] for continuous video EEG.  MRI brain on 11/15 is negative for acute findings.  Shows evolution of ischemic infarcts similar to before.   IV cefepime  could be culprit and this was discontinued. Husband  says when he called 911 pt was having similar symptoms of bilateral upper extremities movement and change in mentation Video EEG: 4 seizures noted on 11/16, keppra  given 11/16 night. Will continue on keppra  500 bid per neurology Continuous video EEG discontinued 11/20 with stable EEG. 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 - Requested oncology to visit her  UTI: Present on admission, failed p.o. antibiotics - Blood and urine cultures negative so far.  - Antibiotics were discontinued.  Low-grade temperature: Remained afebrile  Endometrial cancer with metastases Chronic cancer-related pain with opiate dependence.  Will continue fentanyl , oxy as needed for pain. Fentanyl  patch could be an option.  Will defer to palliative care. - Outpatient follow-up with oncology/Dr. Lonn.  Continue current pain management. - family requested second  opinion at [facility].  Referral will  be sent for outpatient  Hypomagnesemia, replace as needed.  Hypernatremia.  Improved, sodium at 142 - Continue current  free water  through tube  Hypophosphatemia.  Improved with repletion  Hypokalemia.  Improved with repletion  Hyperthyroidism, TSH is 0.23,  free T4 level elevated 1.4.  Mild suppression of TSH.  Likely from ongoing illness.  Antithyroid medications are not indicated at this time.  Will add propranolol  to help with tremors.  Patient additionally hypertensive and tachycardic.  Chronic respiratory failure with hypoxia OSA - Respiratory status stable.  Continue supplemental oxygen .  Continue CPAP at night  History of PE and DVT - Continue with Lovenox   Diabetes mellitus type 2  - Continue with Lantus  and SSI  Anemia of chronic disease - From cancer.  Hemoglobin stable.  No signs of bleeding.  Monitor intermittently  Depression/anxiety - Prozac , Remeron , Haldol  discontinued as discussed above.    History of CAD History of stroke Hyperlipidemia -Continue statin.  Not on aspirin  since she is on treatment dose anticoagulation Obesity class I - Outpatient follow-up  Physical deconditioning - Likely will need SNF pending her clinical course.  Nutrition: Started on cortrak feeding 11/19.  DVT prophylaxis: Therapeutic doses of Lovenox  Code Status: DNR Family Communication: Husband and 2 daughters at bedside  Disposition Plan: Status is:  inpatient because: Severity of illness  Consultants: Neuro, palliative care  Procedures: None  Antimicrobials: Cefepime  from 07/16/2024 -11/14, Meropenem  11/15- Vancomycin  11/19, ampicillin  11/19, acyclovir 11/19 All antibiotics stopped on 07/24/2024  Subjective: Patient with no change in mental status, minimally responsive, partially open eyes with no tracking.  Objective: Vitals:   07/29/24 0430 07/29/24 0808 07/29/24 1118 07/29/24 1601  BP:  (!) 106/58 116/68 116/61  Pulse:  73 (!)  59 72  Resp:  16 16 16   Temp:  98.3 F (36.8 C) 98 F (36.7 C) 98.2 F (36.8 C)  TempSrc:  Oral Oral Oral  SpO2:  100% 100% 100%  Weight: 79.4 kg     Height:        Intake/Output Summary (Last 24 hours) at 07/29/2024 1635 Last data filed at 07/29/2024 1243 Gross per 24 hour  Intake 1010 ml  Output 500 ml  Net 510 ml   Filed Weights   07/24/24 0500 07/28/24 0441 07/29/24 0430  Weight: 80 kg 81.2 kg 79.4 kg    Examination: General.  Frail and minimally responsive lady, in no acute distress. Pulmonary.  Lungs clear bilaterally, normal respiratory effort. CV.  Regular rate and rhythm, no JVD, rub or murmur. Abdomen.  Soft, nontender, nondistended, BS positive. CNS.  Minimally responsive lady, Extremities.  No edema,  pulses intact and symmetrical.  Data Reviewed: I have personally reviewed following labs and imaging studies  CBC: Recent Labs  Lab 07/23/24 0400 07/24/24 0508 07/25/24 0555 07/26/24 0925 07/27/24 0219 07/29/24 0430  WBC 8.6 8.0 8.5 12.0* 10.9* 10.7*  NEUTROABS 6.3 5.8  --  9.6*  --   --   HGB 10.7* 10.9* 10.8* 10.5* 10.0* 8.4*  HCT 34.4* 34.4* 34.2* 33.3* 31.6* 26.1*  MCV 87.3 85.1 85.3 85.6 85.2 84.5  PLT 188 180 174 166 166 206   Basic Metabolic Panel: Recent Labs  Lab 07/23/24 0400 07/24/24 0508 07/25/24 0555 07/26/24 0925 07/27/24 0219 07/29/24 0430  NA 146* 145 146* 142 140 136  K 3.5 3.5 3.2* 4.0 3.7 4.2  CL 109 107 106 102 100 101  CO2 25 29 30 31 31 27   GLUCOSE 308* 261* 196* 147* 109* 190*  BUN 16 15 15 21 22 19   CREATININE 0.68 0.60 0.49 0.57 0.42* 0.59  CALCIUM  9.2 8.8* 8.7* 9.1 9.0 8.6*  MG 1.6* 1.9 1.8  --   --  2.0  PHOS 2.1* 2.3* 2.1* 3.2  --  4.3   GFR: Estimated Creatinine Clearance: 72 mL/min (by C-G formula based on SCr of 0.59 mg/dL). Liver Function Tests: Recent Labs  Lab 07/26/24 0925 07/29/24 0430  ALBUMIN 2.6* 2.2*   No results for input(s): LIPASE, AMYLASE in the last 168 hours. No results for  input(s): AMMONIA in the last 168 hours.  Coagulation Profile: No results for input(s): INR, PROTIME in the last 168 hours.  Cardiac Enzymes: No results for input(s): CKTOTAL, CKMB, CKMBINDEX, TROPONINI in the last 168 hours. BNP (last 3 results) Recent Labs    05/22/24 0630  PROBNP 3,514.0*   HbA1C: No results for input(s): HGBA1C in the last 72 hours. CBG: Recent Labs  Lab 07/29/24 0046 07/29/24 0353 07/29/24 0807 07/29/24 1117 07/29/24 1559  GLUCAP 164* 168* 166* 127* 147*   Lipid Profile: No results for input(s): CHOL, HDL, LDLCALC, TRIG, CHOLHDL, LDLDIRECT in the last 72 hours. Thyroid  Function Tests: No results for input(s): TSH, T4TOTAL, FREET4, T3FREE, THYROIDAB in the last 72 hours.  Anemia Panel: No results for input(s): VITAMINB12, FOLATE, FERRITIN, TIBC, IRON, RETICCTPCT in the last 72 hours.  Sepsis Labs: No results for input(s): PROCALCITON, LATICACIDVEN in the last 168 hours.   Recent Results (from the past 240 hours)  Culture, blood (Routine X 2) w Reflex to ID Panel     Status: None   Collection Time: 07/21/24 11:07 AM   Specimen: BLOOD RIGHT HAND  Result Value Ref Range Status   Specimen Description BLOOD RIGHT HAND  Final   Special Requests   Final    BOTTLES DRAWN AEROBIC AND ANAEROBIC Blood Culture results may not be optimal due to an inadequate volume of blood received in culture bottles   Culture   Final    NO GROWTH 6 DAYS Performed at [facility], [address], [place], [place] [address]    Report Status 07/27/2024 FINAL  Final  Culture, blood (Routine X 2) w Reflex to ID Panel     Status: None   Collection Time: 07/21/24 11:07 AM   Specimen: BLOOD LEFT ARM  Result Value Ref Range Status   Specimen Description BLOOD LEFT ARM  Final   Special Requests   Final    BOTTLES DRAWN AEROBIC AND ANAEROBIC Blood Culture adequate volume   Culture   Final    NO GROWTH 6  DAYS Performed at [facility], [address], [place], [place] [address]    Report Status 07/27/2024 FINAL  Final  CSF culture w Gram Stain     Status: None   Collection Time: 07/24/24  1:02 PM   Specimen: PATH Cytology CSF; Cerebrospinal Fluid  Result Value Ref Range Status   Specimen Description CYTO CSF  Final   Special Requests NONE  Final   Gram Stain   Final    MODERATE WBC PRESENT, PREDOMINANTLY MONONUCLEAR NO ORGANISMS SEEN CYTOSPIN SMEAR    Culture   Final    NO GROWTH 3 DAYS Performed at [facility], [address] [place], [place] [address]    Report Status 07/27/2024 FINAL  Final    Radiology Studies: No results found.  Scheduled Meds:  atorvastatin   80 mg Per Tube QHS   Chlorhexidine  Gluconate Cloth  6 each Topical Daily   enoxaparin  (LOVENOX ) injection  80 mg Subcutaneous Q12H   feeding supplement (PROSource TF20)  60 mL Per Tube Daily   fiber supplement (BANATROL TF)  60 mL Per Tube TID   free water   200 mL Per Tube Q6H   insulin  aspart  0-15 Units Subcutaneous Q4H   insulin  aspart  4 Units Subcutaneous Q4H   insulin  glargine-yfgn  25 Units Subcutaneous BID   levETIRAcetam   500 mg Per Tube BID   lidocaine   1 patch Transdermal Daily   losartan   50 mg Per Tube Daily   propranolol   20 mg Per Tube TID   sodium chloride  flush  10-40 mL Intracatheter Q12H   Continuous Infusions:  feeding supplement (OSMOLITE 1.5 CAL) 1,000 mL (07/28/24 2052)   This record has been created using Conservation officer, historic buildings. Errors have been sought and corrected,but may not always be located. Such creation errors do not reflect on the standard of care.   Time spent.  50 minute  Amaryllis Dare, MD [facility] 07/29/2024, 4:35 PM

## 2024-07-30 DIAGNOSIS — C541 Malignant neoplasm of endometrium: Secondary | ICD-10-CM | POA: Diagnosis not present

## 2024-07-30 DIAGNOSIS — G9341 Metabolic encephalopathy: Secondary | ICD-10-CM | POA: Diagnosis not present

## 2024-07-30 DIAGNOSIS — Z515 Encounter for palliative care: Secondary | ICD-10-CM | POA: Diagnosis not present

## 2024-07-30 DIAGNOSIS — F418 Other specified anxiety disorders: Secondary | ICD-10-CM | POA: Diagnosis not present

## 2024-07-30 LAB — GLUCOSE, CAPILLARY
Glucose-Capillary: 119 mg/dL — ABNORMAL HIGH (ref 70–99)
Glucose-Capillary: 137 mg/dL — ABNORMAL HIGH (ref 70–99)
Glucose-Capillary: 147 mg/dL — ABNORMAL HIGH (ref 70–99)
Glucose-Capillary: 154 mg/dL — ABNORMAL HIGH (ref 70–99)
Glucose-Capillary: 201 mg/dL — ABNORMAL HIGH (ref 70–99)
Glucose-Capillary: 212 mg/dL — ABNORMAL HIGH (ref 70–99)
Glucose-Capillary: 65 mg/dL — ABNORMAL LOW (ref 70–99)
Glucose-Capillary: 90 mg/dL (ref 70–99)

## 2024-07-30 LAB — LACTIC ACID, PLASMA: Lactic Acid, Venous: 0.6 mmol/L (ref 0.5–1.9)

## 2024-07-30 MED ORDER — DEXTROSE 50 % IV SOLN
12.5000 g | INTRAVENOUS | Status: AC
  Administered 2024-07-30: 12.5 g via INTRAVENOUS
  Filled 2024-07-30: qty 50

## 2024-07-30 NOTE — Progress Notes (Signed)
 PROGRESS NOTE    Brenda [name]  FMW:[id number] DOB: Nov 27, 1960 DOA: 07/15/2024 PCP: [facility]  Subjective:  No acute events overnight. Seen and examined at bedside with daughter present. As per nursing, noted to have fingerstick glu of 69 without symptoms for which D50 was given with improvement. Tube feeds still running. AM dose of semglee  and aspart were held. Unable to provide any history due to altered mentation.   Hospital course:  63 year old female with history of metastatic endometrial cancer, chronic cancer-related pain, hypertension, hyperlipidemia, diabetes mellitus, depression, anxiety, CVA, history of DVT and PE anticoagulated on Lovenox , OSA, chronic hypoxic respiratory failure on 2L via nasal cannula and recent hospitalization and discharged on 07/14/2024 for UTI treated with IV antibiotics followed by oral antibiotics on discharge presented with chills and weakness.  She was found to have UTI and was started on IV cefepime .  Unfortunately mental status worsened and she was transferred to [facility] for continuous EEG monitoring with 4 seizures noted on video EEG. Started on Keppra  11/16. Placed on empiric broad-spectrum antibiotics for possible meningitis/encephalitis which was ruled out based on CSF results. As per husband, mental status worsened since antibiotics stopped. Not able to participate with any meaningful history and following only few commands.    No improvement in mentation, developed some loose stool that was attributed to tube feed formula with no concern for infection. Discussed with her oncologist and requested to see the patient nothing much to offer from oncology and they advised palliative care. Family meeting with palliative and they want her to be transferred to a tertiary care center.  Discussed with her oncologist as we do not have any diagnosis which cannot be treated at current facility, transfer will not happen for a second opinion.   Per oncology, family need to arrange themself to take her to any tertiary care center of their choice , apparently has an outpatient appointment with [facility] oncology in December. Husband still hopeful the patient will improve in terms of mental status.    Assessment & Plan:   Acute metabolic encephalopathy Subclinical seizures Encephalopathy prior to admission thought to be secondary to UTI.  Started on cefepime  with worsening mental status.  On 11/15, stiffening of upper extremities, patient transferred to [facility] for continuous video EEG. MRI brain on 11/15 is negative for acute findings.  Shows evolution of ischemic infarcts similar to before.   - IV cefepime  could be culprit and this was discontinued. 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 - Will continue on keppra  500 bid  - as per oncology, no further recommendations - [facility] denied patient transfer with no bed availability  - advised patient to consider reaching out on his own to get second opinion at outside facility - palliative following    UTI:  Present on admission, failed p.o. antibiotics - Blood and urine cultures negative so far.  - Antibiotics were discontinued.   Endometrial cancer with metastases Chronic cancer-related pain with opiate dependence.  - Will continue fentanyl , oxy as needed for pain. Fentanyl  patch could be an option.  - Will defer to palliative care. - Outpatient follow-up with oncology/Dr. Lonn.   - family requested second opinion at [facility].  - Referral will be sent for outpatient   Hypernatremia.   Resolved with IV/PO hydration - decrease free water  flushes  - cont hydration  with tube feeds   Hypophosphatemia Hypokalemia  Improved with repletion - monitor and replete electrolytes as needed   Hyperthyroidism, TSH is 0.23,  free T4  level elevated 1.4.  Mild suppression of TSH.  Likely from ongoing illness.  Antithyroid medications are not indicated at this time.   - cont propranolol  to help with tremors.    Chronic respiratory failure with hypoxia OSA - Respiratory status stable.  Continue supplemental oxygen .  Continue CPAP at night   History of PE and DVT - Continue with Lovenox    Hypoglycemia Diabetes mellitus type 2  - resolved given D50 - held morning semglee  and aspart dose on 11/27 - cont semglee  25mg  BID  - Continue with Lantus  and SSI   Anemia of chronic disease - From cancer.  Hemoglobin stable.  No signs of bleeding.  Monitor intermittently   Depression/anxiety - Prozac , Remeron , Haldol  discontinued as discussed above.     History of CAD History of stroke Hyperlipidemia -Continue statin.  Not on aspirin  since she is on treatment dose anticoagulation   Obesity class I - Outpatient follow-up   Physical deconditioning - Likely will need SNF pending her clinical course.   Nutrition: Started on cortrak feeding 11/19.   DVT prophylaxis:   Lovenox    Code Status: Limited: Do not attempt resuscitation (DNR) -DNR-LIMITED -Do Not Intubate/DNI  Family Communication: Spoke with daughter at bedside Disposition Plan: TBD, likely SNF depending on clinical course Reason for continuing need for hospitalization: severity of illness  Objective: Vitals:   07/30/24 0432 07/30/24 0751 07/30/24 1000 07/30/24 1015  BP:  (!) 149/65 (!) 79/49 (!) 93/51  Pulse:  80 69 71  Resp:  16 16 17   Temp:  98.8 F (37.1 C) 98.6 F (37 C) 98.6 F (37 C)  TempSrc:  Oral Axillary   SpO2:  100% 100% 100%  Weight: 81.4 kg     Height:        Intake/Output Summary (Last 24 hours) at 07/30/2024 1123 Last data filed at 07/30/2024 0547 Gross per 24 hour  Intake 5004.75 ml  Output 550 ml  Net 4454.75 ml   Filed Weights   07/28/24 0441 07/29/24 0430 07/30/24 0432  Weight: 81.2 kg 79.4 kg 81.4 kg     Examination:  Physical Exam Vitals and nursing note reviewed.  Constitutional:      Appearance: She is ill-appearing.     Comments: Weak, frail, somnolent  HENT:     Head: Normocephalic and atraumatic.  Cardiovascular:     Rate and Rhythm: Normal rate and regular rhythm.     Pulses: Normal pulses.     Heart sounds: Normal heart sounds.  Pulmonary:     Effort: Pulmonary effort is normal.     Breath sounds: Normal breath sounds.  Abdominal:     General: Bowel sounds are normal.     Palpations: Abdomen is soft.  Neurological:     Mental Status: She is alert. She is disoriented.     Data Reviewed: I have personally reviewed following labs and imaging studies  CBC: Recent Labs  Lab 07/24/24 0508 07/25/24 0555 07/26/24 0925 07/27/24 0219 07/29/24 0430  WBC 8.0 8.5 12.0* 10.9* 10.7*  NEUTROABS 5.8  --  9.6*  --   --   HGB 10.9* 10.8* 10.5* 10.0* 8.4*  HCT 34.4* 34.2* 33.3* 31.6* 26.1*  MCV 85.1 85.3 85.6 85.2 84.5  PLT 180 174 166 166 206   Basic Metabolic Panel: Recent Labs  Lab 07/24/24 0508 07/25/24 0555  07/26/24 0925 07/27/24 0219 07/29/24 0430  NA 145 146* 142 140 136  K 3.5 3.2* 4.0 3.7 4.2  CL 107 106 102 100 101  CO2 29 30 31 31 27   GLUCOSE 261* 196* 147* 109* 190*  BUN 15 15 21 22 19   CREATININE 0.60 0.49 0.57 0.42* 0.59  CALCIUM  8.8* 8.7* 9.1 9.0 8.6*  MG 1.9 1.8  --   --  2.0  PHOS 2.3* 2.1* 3.2  --  4.3   GFR: Estimated Creatinine Clearance: 73 mL/min (by C-G formula based on SCr of 0.59 mg/dL). Liver Function Tests: Recent Labs  Lab 07/26/24 0925 07/29/24 0430  ALBUMIN 2.6* 2.2*   No results for input(s): LIPASE, AMYLASE in the last 168 hours. No results for input(s): AMMONIA in the last 168 hours. Coagulation Profile: No results for input(s): INR, PROTIME in the last 168 hours. Cardiac Enzymes: No results for input(s): CKTOTAL, CKMB, CKMBINDEX, TROPONINI in the last 168 hours. ProBNP, BNP (last 5  results) Recent Labs    05/22/24 0630 06/19/24 1730  PROBNP 3,514.0*  --   BNP  --  161.6*   HbA1C: No results for input(s): HGBA1C in the last 72 hours. CBG: Recent Labs  Lab 07/30/24 0024 07/30/24 0423 07/30/24 0753 07/30/24 0901 07/30/24 1058  GLUCAP 137* 119* 65* 90 154*   Lipid Profile: No results for input(s): CHOL, HDL, LDLCALC, TRIG, CHOLHDL, LDLDIRECT in the last 72 hours. Thyroid  Function Tests: No results for input(s): TSH, T4TOTAL, FREET4, T3FREE, THYROIDAB in the last 72 hours. Anemia Panel: No results for input(s): VITAMINB12, FOLATE, FERRITIN, TIBC, IRON, RETICCTPCT in the last 72 hours. Sepsis Labs: No results for input(s): PROCALCITON, LATICACIDVEN in the last 168 hours.  Recent Results (from the past 240 hours)  Culture, blood (Routine X 2) w Reflex to ID Panel     Status: None   Collection Time: 07/21/24 11:07 AM   Specimen: BLOOD RIGHT HAND  Result Value Ref Range Status   Specimen Description BLOOD RIGHT HAND  Final   Special Requests   Final    BOTTLES DRAWN AEROBIC AND ANAEROBIC Blood Culture results may not be optimal due to an inadequate volume of blood received in culture bottles   Culture   Final    NO GROWTH 6 DAYS Performed at [facility], [address], [place], [place] [address]    Report Status 07/27/2024 FINAL  Final  Culture, blood (Routine X 2) w Reflex to ID Panel     Status: None   Collection Time: 07/21/24 11:07 AM   Specimen: BLOOD LEFT ARM  Result Value Ref Range Status   Specimen Description BLOOD LEFT ARM  Final   Special Requests   Final    BOTTLES DRAWN AEROBIC AND ANAEROBIC Blood Culture adequate volume   Culture   Final    NO GROWTH 6 DAYS Performed at [facility], [address], [place], [place] [address]    Report Status 07/27/2024 FINAL  Final  CSF culture w Gram Stain     Status: None   Collection Time: 07/24/24  1:02 PM   Specimen: PATH Cytology CSF;  Cerebrospinal Fluid  Result Value Ref Range Status   Specimen Description CYTO CSF  Final   Special Requests NONE  Final   Gram Stain   Final    MODERATE WBC PRESENT, PREDOMINANTLY MONONUCLEAR NO ORGANISMS SEEN CYTOSPIN SMEAR    Culture   Final    NO GROWTH 3 DAYS Performed at [facility], [address], [place], [place] [address]    Report Status 07/27/2024 FINAL  Final     Radiology Studies: No results found.  Scheduled Meds:  atorvastatin   80 mg Per Tube QHS   Chlorhexidine  Gluconate Cloth  6 each Topical Daily   enoxaparin  (LOVENOX ) injection  80 mg Subcutaneous Q12H   feeding supplement (PROSource TF20)  60 mL Per Tube Daily   fiber supplement (BANATROL TF)  60 mL Per Tube TID   free water   200 mL Per Tube Q6H   insulin  aspart  0-15 Units Subcutaneous Q4H   insulin  aspart  4 Units Subcutaneous Q4H   insulin  glargine-yfgn  25 Units Subcutaneous BID   levETIRAcetam   500 mg Per Tube BID   lidocaine   1 patch Transdermal Daily   propranolol   20 mg Per Tube TID   sodium chloride  flush  10-40 mL Intracatheter Q12H   Continuous Infusions:  feeding supplement (OSMOLITE 1.5 CAL) 45 mL/hr at 07/30/24 0547     LOS: 13 days   Norval Bar, MD  [facility]  07/30/2024, 11:23 AM

## 2024-07-30 NOTE — Plan of Care (Signed)
  Problem: Education: Goal: Ability to describe self-care measures that may prevent or decrease complications (Diabetes Survival Skills Education) will improve Outcome: Progressing   Problem: Skin Integrity: Goal: Risk for impaired skin integrity will decrease Outcome: Progressing   Problem: Tissue Perfusion: Goal: Adequacy of tissue perfusion will improve Outcome: Progressing

## 2024-07-30 NOTE — Plan of Care (Signed)
  Problem: Skin Integrity: Goal: Risk for impaired skin integrity will decrease Outcome: Progressing   Problem: Clinical Measurements: Goal: Ability to maintain clinical measurements within normal limits will improve Outcome: Progressing Goal: Will remain free from infection Outcome: Progressing Goal: Diagnostic test results will improve Outcome: Progressing Goal: Respiratory complications will improve Outcome: Progressing Goal: Cardiovascular complication will be avoided Outcome: Progressing   Problem: Nutrition: Goal: Adequate nutrition will be maintained Outcome: Progressing   Problem: Elimination: Goal: Will not experience complications related to bowel motility Outcome: Progressing Goal: Will not experience complications related to urinary retention Outcome: Progressing   Problem: Pain Managment: Goal: General experience of comfort will improve and/or be controlled Outcome: Progressing   Problem: Safety: Goal: Ability to remain free from injury will improve Outcome: Progressing   Problem: Skin Integrity: Goal: Risk for impaired skin integrity will decrease Outcome: Progressing

## 2024-07-31 DIAGNOSIS — G9341 Metabolic encephalopathy: Secondary | ICD-10-CM | POA: Diagnosis not present

## 2024-07-31 DIAGNOSIS — F418 Other specified anxiety disorders: Secondary | ICD-10-CM | POA: Diagnosis not present

## 2024-07-31 DIAGNOSIS — C541 Malignant neoplasm of endometrium: Secondary | ICD-10-CM | POA: Diagnosis not present

## 2024-07-31 DIAGNOSIS — Z515 Encounter for palliative care: Secondary | ICD-10-CM | POA: Diagnosis not present

## 2024-07-31 LAB — GLUCOSE, CAPILLARY
Glucose-Capillary: 145 mg/dL — ABNORMAL HIGH (ref 70–99)
Glucose-Capillary: 109 mg/dL — ABNORMAL HIGH (ref 70–99)
Glucose-Capillary: 193 mg/dL — ABNORMAL HIGH (ref 70–99)
Glucose-Capillary: 171 mg/dL — ABNORMAL HIGH (ref 70–99)
Glucose-Capillary: 235 mg/dL — ABNORMAL HIGH (ref 70–99)
Glucose-Capillary: 270 mg/dL — ABNORMAL HIGH (ref 70–99)

## 2024-07-31 MED ORDER — LOPERAMIDE HCL 1 MG/7.5ML PO SUSP
2.0000 mg | ORAL | Status: AC | PRN
  Administered 2024-08-05: 2 mg
  Filled 2024-07-31 (×3): qty 15

## 2024-07-31 NOTE — Plan of Care (Signed)
  Problem: Skin Integrity: Goal: Risk for impaired skin integrity will decrease Outcome: Progressing   Problem: Nutritional: Goal: Maintenance of adequate nutrition will improve Outcome: Progressing Goal: Progress toward achieving an optimal weight will improve Outcome: Progressing   Problem: Clinical Measurements: Goal: Ability to maintain clinical measurements within normal limits will improve Outcome: Progressing Goal: Will remain free from infection Outcome: Progressing Goal: Diagnostic test results will improve Outcome: Progressing Goal: Respiratory complications will improve Outcome: Progressing Goal: Cardiovascular complication will be avoided Outcome: Progressing   Problem: Elimination: Goal: Will not experience complications related to bowel motility Outcome: Progressing Goal: Will not experience complications related to urinary retention Outcome: Progressing   Problem: Pain Managment: Goal: General experience of comfort will improve and/or be controlled Outcome: Progressing   Problem: Safety: Goal: Ability to remain free from injury will improve Outcome: Progressing   Problem: Skin Integrity: Goal: Risk for impaired skin integrity will decrease Outcome: Progressing

## 2024-07-31 NOTE — Plan of Care (Signed)
  Problem: Education: Goal: Ability to describe self-care measures that may prevent or decrease complications (Diabetes Survival Skills Education) will improve Outcome: Progressing   Problem: Skin Integrity: Goal: Risk for impaired skin integrity will decrease Outcome: Progressing   

## 2024-07-31 NOTE — Progress Notes (Signed)
 PROGRESS NOTE    Brenda [name]  FMW:[id number] DOB: 1961/07/18 DOA: 07/15/2024 PCP: [facility]  Subjective:  No acute events overnight. As per daughter, episode of agitation overnight that improved with redirection and pain control meds. Unable to provide much history due to altered mentation.    Hospital Course: 63 year old female with history of metastatic endometrial cancer, chronic cancer-related pain, hypertension, hyperlipidemia, diabetes mellitus, depression, anxiety, CVA, history of DVT and PE anticoagulated on Lovenox , OSA, chronic hypoxic respiratory failure on 2L via nasal cannula and recent hospitalization and discharged on 07/14/2024 for UTI treated with IV antibiotics followed by oral antibiotics on discharge presented with chills and weakness.  She was found to have UTI and was started on IV cefepime .  Unfortunately mental status worsened and she was transferred to [facility] for continuous EEG monitoring with 4 seizures noted on video EEG. Started on Keppra  11/16. Placed on empiric broad-spectrum antibiotics for possible meningitis/encephalitis which was ruled out based on CSF results. As per husband, mental status worsened since antibiotics stopped. Not able to participate with any meaningful history and following only few commands.    No improvement in mentation, developed some loose stool that was attributed to tube feed formula with no concern for infection. Nothing much to offer from oncology and they advised palliative care. Family meeting with palliative and they want her to be transferred to a tertiary care center.  Discussed with her oncologist as we do not have any diagnosis which cannot be treated at current facility, transfer will not happen for a second opinion.  Per oncology, family need to arrange themself to take her to any tertiary care center of their choice , apparently has an outpatient appointment with [facility] oncology in December. Husband  still hopeful the patient will improve in terms of mental status.     Assessment and Plan:  Acute metabolic encephalopathy Subclinical seizures Encephalopathy prior to admission thought to be secondary to UTI.  Started on cefepime  with worsening mental status.  On 11/15, stiffening of upper extremities, patient transferred to [facility] for continuous video EEG. MRI brain on 11/15 is negative for acute findings.  Shows evolution of ischemic infarcts similar to before. Video EEG: 4 seizures noted on 11/16. Status post LP with acute meningitis/encephalitis panel negative and transient empiric antibiotics   - IV cefepime  could be culprit and this was discontinued. 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 - Will continue on keppra  500 bid  - as per oncology, no further recommendations - [facility] denied patient transfer with no bed availability  - advised patient to consider reaching out on his own to get second opinion at outside facility - palliative following    UTI:  Present on admission, failed p.o. antibiotics - Blood and urine cultures negative so far.  - Antibiotics were discontinued.   Endometrial cancer with metastases Chronic cancer-related pain with opiate dependence.  - Will continue fentanyl , oxy as needed for pain. Fentanyl  patch could be an option.  - Will defer to palliative care. - Outpatient follow-up with oncology/Dr. Lonn.   - family requested second opinion at [facility].  - Referral will be sent for outpatient   Hypernatremia.   Resolved with IV/PO hydration - decrease free water  flushes  - cont hydration with tube feeds   Hypophosphatemia Hypokalemia  Improved with repletion - monitor and replete electrolytes as needed   Hyperthyroidism, TSH is 0.23,  free T4 level elevated 1.4.  Mild suppression of  TSH.  Likely from ongoing illness.  Antithyroid medications are not indicated at this time.   - cont propranolol  to help with tremors.     Chronic respiratory failure with hypoxia OSA - Respiratory status stable.  - Continue supplemental oxygen .  - Continue CPAP at night   History of PE and DVT - Continue with Lovenox    Hypoglycemia Diabetes mellitus type 2  - resolved with D50 - cont semglee  25mg  BID  - Continue with Lantus  and SSI   Anemia of chronic disease From cancer.  Hemoglobin stable.  No signs of bleeding.   - Monitor intermittently   Depression/anxiety - Prozac , Remeron , Haldol  discontinued as discussed above.     History of CAD History of stroke Hyperlipidemia -Continue statin.  Not on aspirin  since she is on treatment dose anticoagulation   Obesity class I - Outpatient follow-up   Physical deconditioning - Likely will need SNF pending her clinical course.   Nutrition: Started on cortrak feeding 11/19.  DVT prophylaxis:   Lovenox    Code Status: Limited: Do not attempt resuscitation (DNR) -DNR-LIMITED -Do Not Intubate/DNI  Family Communication: Spoke with daughter and updated on current treatment plan Disposition Plan: To be determined pending clinical course Reason for continuing need for hospitalization: severity of illness  Objective: Vitals:   07/31/24 0320 07/31/24 0331 07/31/24 0806 07/31/24 1126  BP:  139/74 (!) 128/48 (!) 134/50  Pulse:  91 78 87  Resp:    20  Temp:  99.8 F (37.7 C) 99.2 F (37.3 C) 98.2 F (36.8 C)  TempSrc:  Oral Axillary Oral  SpO2:  100% 100% 100%  Weight: 80.9 kg     Height:        Intake/Output Summary (Last 24 hours) at 07/31/2024 1150 Last data filed at 07/31/2024 1148 Gross per 24 hour  Intake 1944 ml  Output 1450 ml  Net 494 ml   Filed Weights   07/29/24 0430 07/30/24 0432 07/31/24 0320  Weight: 79.4 kg 81.4 kg 80.9 kg    Examination:  Physical Exam Vitals and nursing note reviewed.  Constitutional:      General: She is not in acute distress.    Appearance: She is ill-appearing.     Comments: Weak, frail, somnolent  HENT:      Head: Normocephalic and atraumatic.  Cardiovascular:     Rate and Rhythm: Normal rate and regular rhythm.     Pulses: Normal pulses.     Heart sounds: Normal heart sounds.  Pulmonary:     Effort: Pulmonary effort is normal.     Breath sounds: Normal breath sounds.  Abdominal:     General: Bowel sounds are normal.     Palpations: Abdomen is soft.  Neurological:     Mental Status: She is alert. She is disoriented.     Data Reviewed: I have personally reviewed following labs and imaging studies  CBC: Recent Labs  Lab 07/25/24 0555 07/26/24 0925 07/27/24 0219 07/29/24 0430  WBC 8.5 12.0* 10.9* 10.7*  NEUTROABS  --  9.6*  --   --   HGB 10.8* 10.5* 10.0* 8.4*  HCT 34.2* 33.3* 31.6* 26.1*  MCV 85.3 85.6 85.2 84.5  PLT 174 166 166 206   Basic Metabolic Panel: Recent Labs  Lab 07/25/24 0555 07/26/24 0925 07/27/24 0219 07/29/24 0430  NA 146* 142 140 136  K 3.2* 4.0 3.7 4.2  CL 106 102 100 101  CO2 30 31 31 27   GLUCOSE 196* 147* 109* 190*  BUN 15  21 22 19   CREATININE 0.49 0.57 0.42* 0.59  CALCIUM  8.7* 9.1 9.0 8.6*  MG 1.8  --   --  2.0  PHOS 2.1* 3.2  --  4.3   GFR: Estimated Creatinine Clearance: 72.7 mL/min (by C-G formula based on SCr of 0.59 mg/dL). Liver Function Tests: Recent Labs  Lab 07/26/24 0925 07/29/24 0430  ALBUMIN 2.6* 2.2*   No results for input(s): LIPASE, AMYLASE in the last 168 hours. No results for input(s): AMMONIA in the last 168 hours. Coagulation Profile: No results for input(s): INR, PROTIME in the last 168 hours. Cardiac Enzymes: No results for input(s): CKTOTAL, CKMB, CKMBINDEX, TROPONINI in the last 168 hours. ProBNP, BNP (last 5 results) Recent Labs    05/22/24 0630 06/19/24 1730  PROBNP 3,514.0*  --   BNP  --  161.6*   HbA1C: No results for input(s): HGBA1C in the last 72 hours. CBG: Recent Labs  Lab 07/30/24 1959 07/30/24 2354 07/31/24 0336 07/31/24 0808 07/31/24 1127  GLUCAP 201* 147* 145*  109* 193*   Lipid Profile: No results for input(s): CHOL, HDL, LDLCALC, TRIG, CHOLHDL, LDLDIRECT in the last 72 hours. Thyroid  Function Tests: No results for input(s): TSH, T4TOTAL, FREET4, T3FREE, THYROIDAB in the last 72 hours. Anemia Panel: No results for input(s): VITAMINB12, FOLATE, FERRITIN, TIBC, IRON, RETICCTPCT in the last 72 hours. Sepsis Labs: Recent Labs  Lab 07/30/24 1211  LATICACIDVEN 0.6    Recent Results (from the past 240 hours)  CSF culture w Gram Stain     Status: None   Collection Time: 07/24/24  1:02 PM   Specimen: PATH Cytology CSF; Cerebrospinal Fluid  Result Value Ref Range Status   Specimen Description CYTO CSF  Final   Special Requests NONE  Final   Gram Stain   Final    MODERATE WBC PRESENT, PREDOMINANTLY MONONUCLEAR NO ORGANISMS SEEN CYTOSPIN SMEAR    Culture   Final    NO GROWTH 3 DAYS Performed at [facility], [address] [place], [place] [address]    Report Status 07/27/2024 FINAL  Final     Radiology Studies: No results found.  Scheduled Meds:  atorvastatin   80 mg Per Tube QHS   Chlorhexidine  Gluconate Cloth  6 each Topical Daily   enoxaparin  (LOVENOX ) injection  80 mg Subcutaneous Q12H   feeding supplement (PROSource TF20)  60 mL Per Tube Daily   fiber supplement (BANATROL TF)  60 mL Per Tube TID   free water   200 mL Per Tube Q6H   insulin  aspart  0-15 Units Subcutaneous Q4H   insulin  aspart  4 Units Subcutaneous Q4H   insulin  glargine-yfgn  25 Units Subcutaneous BID   levETIRAcetam   500 mg Per Tube BID   lidocaine   1 patch Transdermal Daily   propranolol   20 mg Per Tube TID   sodium chloride  flush  10-40 mL Intracatheter Q12H   Continuous Infusions:  feeding supplement (OSMOLITE 1.5 CAL) 45 mL/hr at 07/31/24 0659     LOS: 14 days   Norval Bar, MD  [facility]  07/31/2024, 11:50 AM

## 2024-08-01 DIAGNOSIS — G9341 Metabolic encephalopathy: Secondary | ICD-10-CM | POA: Diagnosis not present

## 2024-08-01 DIAGNOSIS — Z515 Encounter for palliative care: Secondary | ICD-10-CM | POA: Diagnosis not present

## 2024-08-01 DIAGNOSIS — F418 Other specified anxiety disorders: Secondary | ICD-10-CM | POA: Diagnosis not present

## 2024-08-01 DIAGNOSIS — C541 Malignant neoplasm of endometrium: Secondary | ICD-10-CM | POA: Diagnosis not present

## 2024-08-01 LAB — GLUCOSE, CAPILLARY
Glucose-Capillary: 205 mg/dL — ABNORMAL HIGH (ref 70–99)
Glucose-Capillary: 156 mg/dL — ABNORMAL HIGH (ref 70–99)
Glucose-Capillary: 106 mg/dL — ABNORMAL HIGH (ref 70–99)
Glucose-Capillary: 112 mg/dL — ABNORMAL HIGH (ref 70–99)
Glucose-Capillary: 79 mg/dL (ref 70–99)
Glucose-Capillary: 107 mg/dL — ABNORMAL HIGH (ref 70–99)

## 2024-08-01 MED ORDER — OXYCODONE HCL 5 MG PO TABS
10.0000 mg | ORAL_TABLET | Freq: Four times a day (QID) | ORAL | Status: DC | PRN
  Administered 2024-08-01 – 2024-08-03 (×6): 10 mg
  Filled 2024-08-01 (×6): qty 2

## 2024-08-01 MED ORDER — ONDANSETRON HCL 4 MG PO TABS: 4.0000 mg | ORAL_TABLET | Freq: Four times a day (QID) | ORAL | Status: AC | PRN

## 2024-08-01 MED ORDER — OXYCODONE HCL 5 MG PO TABS
10.0000 mg | ORAL_TABLET | Freq: Four times a day (QID) | ORAL | Status: DC | PRN
  Administered 2024-08-01 (×2): 10 mg via ORAL
  Filled 2024-08-01 (×2): qty 2

## 2024-08-01 MED ORDER — ACETAMINOPHEN 650 MG RE SUPP: 650.0000 mg | Freq: Four times a day (QID) | RECTAL | Status: DC | PRN

## 2024-08-01 MED ORDER — NALOXONE HCL 0.4 MG/ML IJ SOLN: 0.4000 mg | INTRAMUSCULAR | Status: AC | PRN

## 2024-08-01 MED ORDER — ONDANSETRON HCL 4 MG/2ML IJ SOLN
4.0000 mg | Freq: Four times a day (QID) | INTRAMUSCULAR | Status: DC | PRN
  Administered 2024-09-11 (×2): 4 mg via INTRAVENOUS
  Filled 2024-08-01 (×2): qty 2

## 2024-08-01 MED ORDER — ACETAMINOPHEN 325 MG PO TABS
650.0000 mg | ORAL_TABLET | Freq: Four times a day (QID) | ORAL | Status: DC | PRN
  Administered 2024-08-01 – 2024-08-03 (×5): 650 mg
  Filled 2024-08-01 (×5): qty 2

## 2024-08-01 MED ORDER — HALOPERIDOL LACTATE 5 MG/ML IJ SOLN
1.0000 mg | Freq: Four times a day (QID) | INTRAMUSCULAR | Status: DC | PRN
  Administered 2024-08-02 – 2024-08-03 (×4): 1 mg via INTRAVENOUS
  Filled 2024-08-01 (×4): qty 1

## 2024-08-01 NOTE — Plan of Care (Signed)
  Problem: Nutritional: Goal: Maintenance of adequate nutrition will improve Outcome: Progressing   Problem: Clinical Measurements: Goal: Will remain free from infection Outcome: Progressing Goal: Cardiovascular complication will be avoided Outcome: Progressing   Problem: Education: Goal: Ability to describe self-care measures that may prevent or decrease complications (Diabetes Survival Skills Education) will improve Outcome: Not Progressing   Problem: Coping: Goal: Ability to adjust to condition or change in health will improve Outcome: Not Progressing   Problem: Metabolic: Goal: Ability to maintain appropriate glucose levels will improve Outcome: Not Progressing

## 2024-08-01 NOTE — Progress Notes (Addendum)
 PROGRESS NOTE    Brenda [name]  FMW:[id number] DOB: 08-15-1961 DOA: 07/15/2024 PCP: [facility]  Subjective:  No acute events overnight. As per husband and nursing, episode of acute agitation overnight when patient was more awake, alert and complaining of pain for which it seems she was given oxycodone  10mg  twice, and fentanyl  given once by overnight provider with no further documentation of event. Seen and examined at bedside. Unable to provide any history due to somnolence.   Hospital Course: 63 year old female with history of metastatic endometrial cancer, chronic cancer-related pain, hypertension, hyperlipidemia, diabetes mellitus, depression, anxiety, CVA, history of DVT and PE anticoagulated on Lovenox , OSA, chronic hypoxic respiratory failure on 2L via nasal cannula and recent hospitalization and discharged on 07/14/2024 for UTI treated with IV antibiotics followed by oral antibiotics on discharge presented with chills and weakness.  She was found to have UTI and was started on IV cefepime .  Unfortunately mental status worsened and she was transferred to [facility] for continuous EEG monitoring with 4 seizures noted on video EEG. Started on Keppra  11/16. Placed on empiric broad-spectrum antibiotics for possible meningitis/encephalitis which was ruled out based on CSF results. As per husband, mental status worsened since antibiotics stopped. Not able to participate with any meaningful history and following only few commands.    No improvement in mentation, developed some loose stool that was attributed to tube feed formula with no concern for infection. Nothing much to offer from oncology and they advised palliative care. Family meeting with palliative and they want her to be transferred to a tertiary care center.  Discussed with her oncologist as we do not have any diagnosis which cannot be treated at current facility, transfer will not happen for a second opinion.   Per oncology, family need to arrange themself to take her to any tertiary care center of their choice , apparently has an outpatient appointment with [facility] oncology in December. Husband still hopeful the patient will improve in terms of mental status.     Assessment and Plan:  Acute metabolic encephalopathy Subclinical seizures Waxing and waning course Encephalopathy prior to admission thought to be secondary to UTI.  Started on cefepime  with worsening mental status.  On 11/15, stiffening of upper extremities, patient transferred to [facility] for continuous video EEG. MRI brain on 11/15 is negative for acute findings.  Shows evolution of ischemic infarcts similar to before. Video EEG: 4 seizures noted on 11/16. Status post LP with acute meningitis/encephalitis panel negative and transient empiric antibiotics   - IV cefepime  could be culprit and this was discontinued. 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 - Will continue on keppra  500 bid  - as per oncology, no further recommendations - [facility] denied patient transfer with no bed availability  - advised patient to consider reaching out on his own to get second opinion at outside facility - palliative following    UTI:  Present on admission, failed p.o. antibiotics - Blood and urine cultures negative so far.  - Antibiotics were discontinued.   Endometrial cancer with metastases Chronic cancer-related pain with opiate dependence.  - taken off fentanyl  due to somnolence - cont oxy as needed for pain - Will touch base with palliative care for symptomatic management. - Outpatient follow-up with oncology/Dr. Lonn.   - family requested second opinion at [facility] but [facility] denied patient transfer with no bed availability   - Referral will be sent for outpatient   Hypernatremia.  Resolved with IV/PO hydration - cont free water  flushes  - cont hydration with tube feeds   Hypophosphatemia Hypokalemia   Improved with repletion - monitor and replete electrolytes as needed   Hyperthyroidism, TSH is 0.23,  free T4 level elevated 1.4.  Mild suppression of TSH.  Likely from ongoing illness.  Antithyroid medications are not indicated at this time.   - cont propranolol  to help with tremors.    Chronic respiratory failure with hypoxia OSA - Respiratory status stable.  - Continue supplemental oxygen .  - Continue CPAP at night   History of PE and DVT - Continue with Lovenox    Hypoglycemia Diabetes mellitus type 2  - resolved with D50 - cont semglee  25mg  BID  - Continue with Lantus  and SSI   Anemia of chronic disease From cancer.  Hemoglobin stable.  No signs of bleeding.   - Monitor intermittently   Depression/anxiety - Prozac , and Remeron  discontinued - decrease Haldol  to 1mg  q6h PRN for acute agitation   History of CAD History of stroke Hyperlipidemia -Continue statin.  Not on aspirin  since she is on treatment dose anticoagulation   Obesity class I - Outpatient follow-up   Physical deconditioning - Likely will need SNF pending her clinical course.  DVT prophylaxis:   Lovenox    Code Status: Limited: Do not attempt resuscitation (DNR) -DNR-LIMITED -Do Not Intubate/DNI  Family Communication: Spoke with husband and updated of treatment plan Disposition Plan: TBD pending clinical course Reason for continuing need for hospitalization: severity of illness  Objective: Vitals:   08/01/24 0328 08/01/24 0451 08/01/24 0728 08/01/24 1129  BP: (!) 124/54  (!) 135/54 (!) 107/46  Pulse: 73  79 64  Resp: 16  15 15   Temp: 97.7 F (36.5 C)  97.7 F (36.5 C) 98 F (36.7 C)  TempSrc: Oral  Oral Oral  SpO2: 100%  100% 99%  Weight:  81.6 kg    Height:        Intake/Output Summary (Last 24 hours) at 08/01/2024 1207 Last data filed at 08/01/2024 0846 Gross per 24 hour  Intake 1176.5 ml  Output 1550 ml  Net -373.5 ml   Filed Weights   07/30/24 0432 07/31/24 0320 08/01/24 0451   Weight: 81.4 kg 80.9 kg 81.6 kg    Examination:  Physical Exam Vitals and nursing note reviewed.  Constitutional:      General: She is not in acute distress.    Appearance: She is ill-appearing (chronically).     Comments: Weak, frail, somnolent  HENT:     Head: Normocephalic and atraumatic.  Cardiovascular:     Rate and Rhythm: Normal rate and regular rhythm.     Pulses: Normal pulses.     Heart sounds: Normal heart sounds.  Pulmonary:     Effort: Pulmonary effort is normal.     Breath sounds: Normal breath sounds.  Abdominal:     General: Bowel sounds are normal.     Palpations: Abdomen is soft.     Data Reviewed: I have personally reviewed following labs and imaging studies  CBC: Recent Labs  Lab 07/26/24 0925 07/27/24 0219 07/29/24 0430  WBC 12.0* 10.9* 10.7*  NEUTROABS 9.6*  --   --   HGB 10.5* 10.0* 8.4*  HCT 33.3* 31.6* 26.1*  MCV 85.6 85.2 84.5  PLT 166 166 206   Basic Metabolic Panel: Recent Labs  Lab 07/26/24 0925 07/27/24 0219 07/29/24 0430  NA 142 140 136  K 4.0 3.7 4.2  CL 102 100 101  CO2 31 31 27   GLUCOSE 147* 109* 190*  BUN 21 22 19   CREATININE 0.57 0.42* 0.59  CALCIUM  9.1 9.0 8.6*  MG  --   --  2.0  PHOS 3.2  --  4.3   GFR: Estimated Creatinine Clearance: 73.1 mL/min (by C-G formula based on SCr of 0.59 mg/dL). Liver Function Tests: Recent Labs  Lab 07/26/24 0925 07/29/24 0430  ALBUMIN 2.6* 2.2*   No results for input(s): LIPASE, AMYLASE in the last 168 hours. No results for input(s): AMMONIA in the last 168 hours. Coagulation Profile: No results for input(s): INR, PROTIME in the last 168 hours. Cardiac Enzymes: No results for input(s): CKTOTAL, CKMB, CKMBINDEX, TROPONINI in the last 168 hours. ProBNP, BNP (last 5 results) Recent Labs    05/22/24 0630 06/19/24 1730  PROBNP 3,514.0*  --   BNP  --  161.6*   HbA1C: No results for input(s): HGBA1C in the last 72 hours. CBG: Recent Labs  Lab  07/31/24 2000 07/31/24 2311 08/01/24 0328 08/01/24 0733 08/01/24 1130  GLUCAP 235* 270* 205* 156* 106*   Lipid Profile: No results for input(s): CHOL, HDL, LDLCALC, TRIG, CHOLHDL, LDLDIRECT in the last 72 hours. Thyroid  Function Tests: No results for input(s): TSH, T4TOTAL, FREET4, T3FREE, THYROIDAB in the last 72 hours. Anemia Panel: No results for input(s): VITAMINB12, FOLATE, FERRITIN, TIBC, IRON, RETICCTPCT in the last 72 hours. Sepsis Labs: Recent Labs  Lab 07/30/24 1211  LATICACIDVEN 0.6    Recent Results (from the past 240 hours)  CSF culture w Gram Stain     Status: None   Collection Time: 07/24/24  1:02 PM   Specimen: PATH Cytology CSF; Cerebrospinal Fluid  Result Value Ref Range Status   Specimen Description CYTO CSF  Final   Special Requests NONE  Final   Gram Stain   Final    MODERATE WBC PRESENT, PREDOMINANTLY MONONUCLEAR NO ORGANISMS SEEN CYTOSPIN SMEAR    Culture   Final    NO GROWTH 3 DAYS Performed at [facility], [address], [place], [place] [address]    Report Status 07/27/2024 FINAL  Final     Radiology Studies: No results found.  Scheduled Meds:  atorvastatin   80 mg Per Tube QHS   Chlorhexidine  Gluconate Cloth  6 each Topical Daily   enoxaparin  (LOVENOX ) injection  80 mg Subcutaneous Q12H   feeding supplement (PROSource TF20)  60 mL Per Tube Daily   fiber supplement (BANATROL TF)  60 mL Per Tube TID   free water   200 mL Per Tube Q6H   insulin  aspart  0-15 Units Subcutaneous Q4H   insulin  aspart  4 Units Subcutaneous Q4H   insulin  glargine-yfgn  25 Units Subcutaneous BID   levETIRAcetam   500 mg Per Tube BID   lidocaine   1 patch Transdermal Daily   propranolol   20 mg Per Tube TID   sodium chloride  flush  10-40 mL Intracatheter Q12H   Continuous Infusions:  feeding supplement (OSMOLITE 1.5 CAL) 1,000 mL (07/31/24 1852)     LOS: 15 days   Norval Bar, MD  [facility]  08/01/2024,  12:07 PM

## 2024-08-02 DIAGNOSIS — C541 Malignant neoplasm of endometrium: Secondary | ICD-10-CM | POA: Diagnosis not present

## 2024-08-02 DIAGNOSIS — G9341 Metabolic encephalopathy: Secondary | ICD-10-CM | POA: Diagnosis not present

## 2024-08-02 DIAGNOSIS — F418 Other specified anxiety disorders: Secondary | ICD-10-CM | POA: Diagnosis not present

## 2024-08-02 DIAGNOSIS — Z515 Encounter for palliative care: Secondary | ICD-10-CM | POA: Diagnosis not present

## 2024-08-02 LAB — GLUCOSE, CAPILLARY
Glucose-Capillary: 117 mg/dL — ABNORMAL HIGH (ref 70–99)
Glucose-Capillary: 156 mg/dL — ABNORMAL HIGH (ref 70–99)
Glucose-Capillary: 181 mg/dL — ABNORMAL HIGH (ref 70–99)
Glucose-Capillary: 187 mg/dL — ABNORMAL HIGH (ref 70–99)
Glucose-Capillary: 194 mg/dL — ABNORMAL HIGH (ref 70–99)
Glucose-Capillary: 197 mg/dL — ABNORMAL HIGH (ref 70–99)
Glucose-Capillary: 203 mg/dL — ABNORMAL HIGH (ref 70–99)

## 2024-08-02 LAB — CBC
HCT: 20.2 % — ABNORMAL LOW (ref 36.0–46.0)
HCT: 21.7 % — ABNORMAL LOW (ref 36.0–46.0)
Hemoglobin: 6.6 g/dL — CL (ref 12.0–15.0)
Hemoglobin: 7.1 g/dL — ABNORMAL LOW (ref 12.0–15.0)
MCH: 28 pg (ref 26.0–34.0)
MCH: 27.6 pg (ref 26.0–34.0)
MCHC: 32.7 g/dL (ref 30.0–36.0)
MCHC: 32.7 g/dL (ref 30.0–36.0)
MCV: 85.6 fL (ref 80.0–100.0)
MCV: 84.4 fL (ref 80.0–100.0)
Platelets: 216 K/uL (ref 150–400)
Platelets: 225 K/uL (ref 150–400)
RBC: 2.36 MIL/uL — ABNORMAL LOW (ref 3.87–5.11)
RBC: 2.57 MIL/uL — ABNORMAL LOW (ref 3.87–5.11)
RDW: 17 % — ABNORMAL HIGH (ref 11.5–15.5)
RDW: 16.9 % — ABNORMAL HIGH (ref 11.5–15.5)
WBC: 6.3 K/uL (ref 4.0–10.5)
WBC: 6.5 K/uL (ref 4.0–10.5)
nRBC: 0 % (ref 0.0–0.2)
nRBC: 0 % (ref 0.0–0.2)

## 2024-08-02 LAB — BASIC METABOLIC PANEL WITH GFR
Anion gap: 8 (ref 5–15)
BUN: 18 mg/dL (ref 8–23)
CO2: 28 mmol/L (ref 22–32)
Calcium: 8.2 mg/dL — ABNORMAL LOW (ref 8.9–10.3)
Chloride: 96 mmol/L — ABNORMAL LOW (ref 98–111)
Creatinine, Ser: 0.45 mg/dL (ref 0.44–1.00)
GFR, Estimated: >60 mL/min (ref >60)
Glucose, Bld: 191 mg/dL — ABNORMAL HIGH (ref 70–99)
Potassium: 4.1 mmol/L (ref 3.5–5.1)
Sodium: 132 mmol/L — ABNORMAL LOW (ref 135–145)

## 2024-08-02 LAB — PREPARE RBC (CROSSMATCH)

## 2024-08-02 LAB — OCCULT BLOOD X 1 CARD TO LAB, STOOL: Fecal Occult Bld: POSITIVE — AB

## 2024-08-02 MED ORDER — OXYCODONE HCL 5 MG PO TABS: 5.0000 mg | ORAL_TABLET | Freq: Four times a day (QID) | ORAL | Status: DC | PRN

## 2024-08-02 MED ORDER — FREE WATER
200.0000 mL | Freq: Three times a day (TID) | Status: DC
  Administered 2024-08-02 – 2024-08-04 (×6): 200 mL

## 2024-08-02 MED ORDER — OXYCODONE HCL 5 MG PO TABS
5.0000 mg | ORAL_TABLET | Freq: Four times a day (QID) | ORAL | Status: DC | PRN
  Administered 2024-08-02: 5 mg
  Filled 2024-08-02: qty 1

## 2024-08-02 MED ORDER — SODIUM CHLORIDE 0.9% IV SOLUTION: Freq: Once | INTRAVENOUS | Status: DC

## 2024-08-02 NOTE — Progress Notes (Signed)
 PROGRESS NOTE    Brenda [name]  FMW:[id number] DOB: Feb 10, 1961 DOA: 07/15/2024 PCP: [facility]  Subjective:  Noted to have acute pain this morning for which tylenol  and oxycodone  10mg  were given with good effect. Noted to also have acute on chronic anemia on morning labs without signs/symptoms of acute bleed for which an FOBT was ordered and blood transfusion ordered. Seen and examined at bedside with husband present. Unable to provide any history due to altered mentation/somnolence still.     Hospital Course: 63 year old female with history of metastatic endometrial cancer, chronic cancer-related pain, hypertension, hyperlipidemia, diabetes mellitus, depression, anxiety, CVA, history of DVT and PE anticoagulated on Lovenox , OSA, chronic hypoxic respiratory failure on 2L via nasal cannula and recent hospitalization and discharged on 07/14/2024 for UTI treated with IV antibiotics followed by oral antibiotics on discharge presented with chills and weakness.  She was found to have UTI and was started on IV cefepime .  Unfortunately mental status worsened and she was transferred to [facility] for continuous EEG monitoring with 4 seizures noted on video EEG. Started on Keppra  11/16. Placed on empiric broad-spectrum antibiotics for possible meningitis/encephalitis which was ruled out based on CSF results. As per husband, mental status worsened since antibiotics stopped. Not able to participate with any meaningful history and following only few commands.    No improvement in mentation, developed some loose stool that was attributed to tube feed formula with no concern for infection. Nothing much to offer from oncology and they advised palliative care. Family meeting with palliative and they want her to be transferred to a tertiary care center.  Discussed with her oncologist as we do not have any diagnosis which cannot be treated at current facility, transfer will not happen for a second  opinion.  Per oncology, family need to arrange themself to take her to any tertiary care center of their choice , apparently has an outpatient appointment with [facility] oncology in December. Husband still hopeful the patient will improve in terms of mental status.     Assessment and Plan:  Acute metabolic encephalopathy Subclinical seizures Waxing and waning course Encephalopathy prior to admission thought to be secondary to UTI.  Started on cefepime  with worsening mental status.  On 11/15, stiffening of upper extremities, patient transferred to [facility] for continuous video EEG. MRI brain on 11/15 is negative for acute findings.  Shows evolution of ischemic infarcts similar to before. Video EEG: 4 seizures noted on 11/16. Status post LP with acute meningitis/encephalitis panel negative and transient empiric antibiotics   - IV cefepime  could be culprit and this was discontinued. 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 - Will continue on keppra  500 bid  - pain management as elsewhere - as per oncology, no further recommendations - [facility] denied patient transfer with no bed availability  - advised patient to consider reaching out on his own to get second opinion at outside facility - palliative following   Acute on chronic anemia Anemia of chronic disease - Hgb 7.1 < 6.6 < 8.4, baseline 7-10 prior to this prolonged hospitalization - From cancer.  - No signs/symptoms of acute bleeding - hold therapeutic lovenox  - hold off on blood transfusion at present - Hgb goal > 7 - monitor CBC    Endometrial cancer with metastases Chronic cancer-related pain with opiate dependence.  - taken off fentanyl  due to somnolence - cont acetaminophen  PRN for mild pain - start oxycodone  5mg  PRN for moderate pain -  cont oxycodone  10mg  PRN for severe pain - Will touch base with palliative care for assistance with symptomatic pain management. - Outpatient follow-up with  oncology/Dr. Lonn.   - family requested second opinion at [facility] but [facility] denied patient transfer with no bed availability   - Referral will be sent for outpatient  Acute RLL segmental PE Splenic infarct - acute PE diagnosed in 06/19/2024 - splenic infarct diagnosed in 05/2024 - hold Lovenox  given concern for acute bleed - resume if repeat CBC remains stable  History of CAD History of stroke Hyperlipidemia -Continue statin.  Not on aspirin  since she is to be on treatment dose anticoagulation - lovenox  on hold given concern for acute on chronic anemia - resume if repeat CBC remains stable  Hypernatremia.   Resolved with IV/PO hydration. Now with mild hyponatremia - decrease free water  flushes q6h to q8h - cont hydration with tube feeds - monitor BMP   UTI:  Present on admission, failed p.o. antibiotics - Blood and urine cultures negative so far.  - Antibiotics were discontinued.   Hypophosphatemia Hypokalemia  Improved with repletion - monitor and replete electrolytes as needed   Hyperthyroidism, TSH is 0.23,  free T4 level elevated 1.4.  Mild suppression of TSH.  Likely from ongoing illness.  Antithyroid medications are not indicated at this time.   - cont propranolol  to help with tremors.    Chronic respiratory failure with hypoxia OSA - Respiratory status stable.  - Continue supplemental oxygen .  - Continue CPAP at night      Hypoglycemia Diabetes mellitus type 2  - resolved with D50 - cont semglee  25mg  BID  - Continue with Lantus  and SSI   Depression/anxiety - Prozac , and Remeron  discontinued - decrease Haldol  to 1mg  q6h PRN for acute agitation      Obesity class I - Outpatient follow-up   Physical deconditioning - Likely will need SNF pending her clinical course.   DVT prophylaxis: Place and maintain sequential compression device Start: 08/02/24 0738  SCDs   Code Status: Limited: Do not attempt resuscitation (DNR) -DNR-LIMITED -Do Not  Intubate/DNI  Family Communication: updated husband at bedside Disposition Plan: TBD pending clinical course Reason for continuing need for hospitalization: severity of illness, enteral nutrition plan, monitor for anemia, palliative following  Objective: Vitals:   08/01/24 2037 08/01/24 2230 08/02/24 0500 08/02/24 0800  BP: (!) 92/45 (!) 100/48 (!) 132/50 (!) 125/105  Pulse:  61 81 66  Resp:      Temp:    98.1 F (36.7 C)  TempSrc:    Axillary  SpO2:  100% 100% 100%  Weight:      Height:        Intake/Output Summary (Last 24 hours) at 08/02/2024 1131 Last data filed at 08/02/2024 0611 Gross per 24 hour  Intake 800 ml  Output 1250 ml  Net -450 ml   Filed Weights   07/30/24 0432 07/31/24 0320 08/01/24 0451  Weight: 81.4 kg 80.9 kg 81.6 kg    Examination:  Physical Exam Vitals and nursing note reviewed.  Constitutional:      General: She is not in acute distress.    Appearance: She is ill-appearing.     Comments: Weak, frail, somnolent  HENT:     Head: Normocephalic and atraumatic.  Cardiovascular:     Rate and Rhythm: Normal rate and regular rhythm.     Pulses: Normal pulses.     Heart sounds: Normal heart sounds.  Pulmonary:     Effort: Pulmonary effort  is normal.     Breath sounds: Normal breath sounds.  Abdominal:     General: Bowel sounds are normal.     Palpations: Abdomen is soft.  Neurological:     Mental Status: She is disoriented.     Data Reviewed: I have personally reviewed following labs and imaging studies  CBC: Recent Labs  Lab 07/27/24 0219 07/29/24 0430 08/02/24 0427 08/02/24 0635  WBC 10.9* 10.7* 6.3 6.5  HGB 10.0* 8.4* 6.6* 7.1*  HCT 31.6* 26.1* 20.2* 21.7*  MCV 85.2 84.5 85.6 84.4  PLT 166 206 216 225   Basic Metabolic Panel: Recent Labs  Lab 07/27/24 0219 07/29/24 0430 08/02/24 0427  NA 140 136 132*  K 3.7 4.2 4.1  CL 100 101 96*  CO2 31 27 28   GLUCOSE 109* 190* 191*  BUN 22 19 18   CREATININE 0.42* 0.59 0.45   CALCIUM  9.0 8.6* 8.2*  MG  --  2.0  --   PHOS  --  4.3  --    GFR: Estimated Creatinine Clearance: 73.1 mL/min (by C-G formula based on SCr of 0.45 mg/dL). Liver Function Tests: Recent Labs  Lab 07/29/24 0430  ALBUMIN 2.2*   No results for input(s): LIPASE, AMYLASE in the last 168 hours. No results for input(s): AMMONIA in the last 168 hours. Coagulation Profile: No results for input(s): INR, PROTIME in the last 168 hours. Cardiac Enzymes: No results for input(s): CKTOTAL, CKMB, CKMBINDEX, TROPONINI in the last 168 hours. ProBNP, BNP (last 5 results) Recent Labs    05/22/24 0630 06/19/24 1730  PROBNP 3,514.0*  --   BNP  --  161.6*   HbA1C: No results for input(s): HGBA1C in the last 72 hours. CBG: Recent Labs  Lab 08/01/24 1948 08/01/24 2146 08/02/24 0000 08/02/24 0426 08/02/24 0824  GLUCAP 79 107* 117* 181* 156*   Lipid Profile: No results for input(s): CHOL, HDL, LDLCALC, TRIG, CHOLHDL, LDLDIRECT in the last 72 hours. Thyroid  Function Tests: No results for input(s): TSH, T4TOTAL, FREET4, T3FREE, THYROIDAB in the last 72 hours. Anemia Panel: No results for input(s): VITAMINB12, FOLATE, FERRITIN, TIBC, IRON, RETICCTPCT in the last 72 hours. Sepsis Labs: Recent Labs  Lab 07/30/24 1211  LATICACIDVEN 0.6    Recent Results (from the past 240 hours)  CSF culture w Gram Stain     Status: None   Collection Time: 07/24/24  1:02 PM   Specimen: PATH Cytology CSF; Cerebrospinal Fluid  Result Value Ref Range Status   Specimen Description CYTO CSF  Final   Special Requests NONE  Final   Gram Stain   Final    MODERATE WBC PRESENT, PREDOMINANTLY MONONUCLEAR NO ORGANISMS SEEN CYTOSPIN SMEAR    Culture   Final    NO GROWTH 3 DAYS Performed at [facility], [address] [place], [place] [address]    Report Status 07/27/2024 FINAL  Final     Radiology Studies: No results found.  Scheduled Meds:   atorvastatin   80 mg Per Tube QHS   Chlorhexidine  Gluconate Cloth  6 each Topical Daily   feeding supplement (PROSource TF20)  60 mL Per Tube Daily   fiber supplement (BANATROL TF)  60 mL Per Tube TID   free water   200 mL Per Tube Q8H   insulin  aspart  0-15 Units Subcutaneous Q4H   insulin  aspart  4 Units Subcutaneous Q4H   insulin  glargine-yfgn  25 Units Subcutaneous BID   levETIRAcetam   500 mg Per Tube BID   lidocaine   1  patch Transdermal Daily   propranolol   20 mg Per Tube TID   sodium chloride  flush  10-40 mL Intracatheter Q12H   Continuous Infusions:  feeding supplement (OSMOLITE 1.5 CAL) 1,000 mL (08/01/24 1750)     LOS: 16 days   Norval Bar, MD  [facility]  08/02/2024, 11:31 AM

## 2024-08-02 NOTE — Progress Notes (Signed)
 Overnight cross coverage  Yesterday night patient received a dose of PRN IV fentanyl  50 mcg for chronic cancer related pain which was ordered by another provider previously.  After patient was given fentanyl , RN had informed me that family did not want the patient to receive any more IV fentanyl  for pain and instead requested dose of her oxycodone  to be changed from 5 mg every 6 hours as needed to 10 mg every 6 hours as needed which is the dose she takes at home.  As such, I had discontinued IV fentanyl  and changed dose of oxycodone  to her home dose as requested by family.  Tonight, contacted by RN again stating patient is crying in pain but encephalopathic and not able to tell where exactly she is feeling pain.  Per review of daytime provider's note, it seems patient became somnolent after receiving IV fentanyl  yesterday night so best to avoid giving any IV opiates.  Patient is due for another dose of her PRN meds Tylenol  and oxycodone  this morning which RN will administer soon.  Requested RN to do bladder scan to rule out urinary retention.  Will defer further adjustment of pain medication regimen to primary day team provider.  Also informed by RN regarding critical hemoglobin on morning labs today.  Hemoglobin 6.6 on morning labs today and was previously 8.4 on labs 4 days ago.  No overt bleeding and hemodynamically stable.  She is on Lovenox  due to history of DVT/PE.  FOBT ordered.  Type and screen, 1 unit PRBCs ordered after obtaining consent from the patient's husband.  Follow-up posttransfusion H&H and continue to transfuse if hemoglobin is less than 7.

## 2024-08-02 NOTE — Plan of Care (Signed)
  Problem: Metabolic: Goal: Ability to maintain appropriate glucose levels will improve Outcome: Progressing   Problem: Tissue Perfusion: Goal: Adequacy of tissue perfusion will improve Outcome: Progressing   Problem: Education: Goal: Ability to describe self-care measures that may prevent or decrease complications (Diabetes Survival Skills Education) will improve Outcome: Not Progressing

## 2024-08-03 DIAGNOSIS — F418 Other specified anxiety disorders: Secondary | ICD-10-CM | POA: Diagnosis not present

## 2024-08-03 DIAGNOSIS — Z515 Encounter for palliative care: Secondary | ICD-10-CM | POA: Diagnosis not present

## 2024-08-03 DIAGNOSIS — C541 Malignant neoplasm of endometrium: Secondary | ICD-10-CM | POA: Diagnosis not present

## 2024-08-03 DIAGNOSIS — G9341 Metabolic encephalopathy: Secondary | ICD-10-CM | POA: Diagnosis not present

## 2024-08-03 LAB — IRON AND TIBC
Iron: 29 ug/dL (ref 28–170)
Saturation Ratios: 13 % (ref 10.4–31.8)
TIBC: 231 ug/dL — ABNORMAL LOW (ref 250–450)
UIBC: 202 ug/dL

## 2024-08-03 LAB — BASIC METABOLIC PANEL WITH GFR
Anion gap: 6 (ref 5–15)
BUN: 18 mg/dL (ref 8–23)
CO2: 28 mmol/L (ref 22–32)
Calcium: 8.4 mg/dL — ABNORMAL LOW (ref 8.9–10.3)
Chloride: 99 mmol/L (ref 98–111)
Creatinine, Ser: 0.45 mg/dL (ref 0.44–1.00)
GFR, Estimated: >60 mL/min (ref >60)
Glucose, Bld: 204 mg/dL — ABNORMAL HIGH (ref 70–99)
Potassium: 4.3 mmol/L (ref 3.5–5.1)
Sodium: 133 mmol/L — ABNORMAL LOW (ref 135–145)

## 2024-08-03 LAB — GLUCOSE, CAPILLARY
Glucose-Capillary: 122 mg/dL — ABNORMAL HIGH (ref 70–99)
Glucose-Capillary: 97 mg/dL (ref 70–99)
Glucose-Capillary: 115 mg/dL — ABNORMAL HIGH (ref 70–99)
Glucose-Capillary: 132 mg/dL — ABNORMAL HIGH (ref 70–99)

## 2024-08-03 LAB — CBC
HCT: 21.2 % — ABNORMAL LOW (ref 36.0–46.0)
Hemoglobin: 6.8 g/dL — CL (ref 12.0–15.0)
MCH: 27.6 pg (ref 26.0–34.0)
MCHC: 32.1 g/dL (ref 30.0–36.0)
MCV: 86.2 fL (ref 80.0–100.0)
Platelets: 269 K/uL (ref 150–400)
RBC: 2.46 MIL/uL — ABNORMAL LOW (ref 3.87–5.11)
RDW: 16.6 % — ABNORMAL HIGH (ref 11.5–15.5)
WBC: 7.4 K/uL (ref 4.0–10.5)
nRBC: 0 % (ref 0.0–0.2)

## 2024-08-03 LAB — PREPARE RBC (CROSSMATCH)

## 2024-08-03 LAB — FERRITIN: Ferritin: 100 ng/mL (ref 11–307)

## 2024-08-03 MED ORDER — CYCLOBENZAPRINE HCL 5 MG PO TABS
2.5000 mg | ORAL_TABLET | Freq: Once | ORAL | Status: AC
  Administered 2024-08-03: 2.5 mg via ORAL
  Filled 2024-08-03 (×2): qty 0.5

## 2024-08-03 MED ORDER — IRON SUCROSE 200 MG IVPB - SIMPLE MED
200.0000 mg | Status: DC
  Administered 2024-08-03 – 2024-08-05 (×3): 200 mg via INTRAVENOUS
  Filled 2024-08-03: qty 200
  Filled 2024-08-03: qty 110
  Filled 2024-08-03: qty 200
  Filled 2024-08-03: qty 110
  Filled 2024-08-03: qty 200

## 2024-08-03 MED ORDER — ACETAMINOPHEN 500 MG PO TABS
1000.0000 mg | ORAL_TABLET | Freq: Three times a day (TID) | ORAL | Status: DC
  Administered 2024-08-03 – 2024-08-09 (×19): 1000 mg
  Filled 2024-08-03 (×19): qty 2

## 2024-08-03 MED ORDER — SODIUM CHLORIDE 0.9% IV SOLUTION: Freq: Once | INTRAVENOUS | Status: DC

## 2024-08-03 MED ORDER — OXYCODONE HCL 5 MG PO TABS
10.0000 mg | ORAL_TABLET | Freq: Every day | ORAL | Status: DC
  Administered 2024-08-03 – 2024-08-15 (×13): 10 mg via ORAL
  Filled 2024-08-03 (×13): qty 2

## 2024-08-03 MED ORDER — OXYCODONE HCL 5 MG PO TABS
2.5000 mg | ORAL_TABLET | ORAL | Status: DC | PRN
  Administered 2024-08-03 – 2024-08-16 (×29): 2.5 mg
  Filled 2024-08-03 (×30): qty 1

## 2024-08-03 MED ORDER — ARTIFICIAL TEARS OPHTHALMIC OINT
TOPICAL_OINTMENT | OPHTHALMIC | Status: AC | PRN
  Administered 2024-08-03: 1 via OPHTHALMIC
  Filled 2024-08-03 (×2): qty 3.5

## 2024-08-03 MED ORDER — FLUOXETINE HCL 10 MG PO CAPS
10.0000 mg | ORAL_CAPSULE | Freq: Every day | ORAL | Status: DC
  Administered 2024-08-03 – 2024-08-10 (×8): 10 mg
  Filled 2024-08-03 (×8): qty 1

## 2024-08-03 MED ORDER — SODIUM CHLORIDE 0.9% IV SOLUTION: Freq: Once | INTRAVENOUS | Status: AC

## 2024-08-03 MED ORDER — MELATONIN 3 MG PO TABS
3.0000 mg | ORAL_TABLET | Freq: Once | ORAL | Status: AC
  Administered 2024-08-03: 3 mg via ORAL
  Filled 2024-08-03: qty 1

## 2024-08-03 NOTE — Plan of Care (Signed)
  Problem: Nutritional: Goal: Maintenance of adequate nutrition will improve Outcome: Progressing   Problem: Skin Integrity: Goal: Risk for impaired skin integrity will decrease Outcome: Progressing   Problem: Coping: Goal: Ability to adjust to condition or change in health will improve Outcome: Not Progressing

## 2024-08-03 NOTE — Progress Notes (Signed)
 Daily Progress Note   Date: 08/03/2024   Patient Name: Brenda [name]  DOB: 01-06-1961  MRN: [id number]  Age / Sex: 63 y.o., female  Attending Physician: Cosette Blackwater, MD Primary Care Physician: [facility] Admit Date: 07/15/2024 Length of Stay: 17 days  Reason for Follow-up: Establishing goals of care  Assessment & Plan:   HPI/Patient Profile:  63 y.o. 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   Palliative medicine consulted for goals of care conversation.   SUMMARY OF RECOMMENDATIONS DNR-limited Family desires to continue efforts to improve mentation, treat the treatable, and generally hope for a good outcome while also understanding concern for poor prognosis and lack of other treatment options Decreased dose of Oxycodone  to 2.5 mg Q3H PRN for acute pain per tube Schedule Oxycodone  10 mg at bedtime Scheduled Tylenol  1,000 mg TID  Scheduled Fluoxetine  10mg  daily for depression Ongoing goals of care discussions pending clinical course. Husband and daughter will attempt to discuss with patient regarding artificial nutrition/PEG. Advised against this  PMT will continue to follow and support  Prognosis: Poor long-term prognosis  Discharge Planning: To Be Determined  Discussed with: Patient, patient's husband and daughter [name], RN, MD  Subjective:   Subjective: Chart Reviewed including progress notes, labs, imaging, and MAR. Updates received from primary MD. Patient assessed - she is lethargic, slightly more alert during my second visit when I returned to bedside to meet with her husband. Created space and opportunity for patient and family to explore thoughts and feelings regarding current medical situation.  Today's Discussion:  Initially met at the bedside with patient's  daughter [name]. She expressed her concern about medical team's ability to be on the same page with the family and recent frustrations with care. We discussed her questions about the possibility of SLP trying to evaluate again with a modified approach, changing pain medications further to reduce sedation, and risks/benefits of PEG. She shares that she is supportive of her father's wishes and that he will be visiting later today. Discussed previous decisions regarding feeding tube for patient's sister before she passed and how this may influence patient's wishes for her own care.  Returned to the bedside in the afternoon and met with her husband [name]. I practiced reflective listening as he shared the details of his experiences navigating patient's illness and his regret that communication with the medical team has gone so poorly. It has been a difficult road since August of this year. He does not want to discuss hospice in front of Randine and notes that this is what her current care plan feels right now. Emotional support and therapeutic listening was provided. Discussed family's interest in a second opinion and acknowledged it would be difficult for patient to leave the hospital in her current condition. [name] wants to try everything possible to reverse the cognitive decline that has occurred over the past 3 weeks or so. Patient did state yes when I asked if she felt depressed. Sadly, patient's mother died two days ago and she does not know this yet given family's concern that this will cause her to truly give up.   Emphasized continued importance of ongoing goals of care discussions. Confirmed plan to adjust opioids for lower dose and higher frequency to see if sedation can improve, treat for depression, and determine a long-term plan.   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  HENT:     Head: Normocephalic.     Nose: Nose normal.     Mouth/Throat:     Mouth: Mucous membranes are dry.  Cardiovascular:     Rate and Rhythm: Normal rate.  Pulmonary:     Effort: Pulmonary effort is normal.     Comments: On 2 L/min [place] Abdominal:     Palpations: Abdomen is soft.  Skin:    General: Skin is warm.  Neurological:     Mental Status: She is lethargic.  Psychiatric:        Mood and Affect: Mood is depressed.    Palliative Assessment/Data: 10%    I personally spent a total of 75 minutes in the care of the patient today including preparing to see the patient, getting/reviewing separately obtained history, performing a medically appropriate exam/evaluation, counseling and educating, referring and communicating with other health care professionals, and documenting clinical information in the EHR.  Taegen Lennox P Harry Shuck, PA-C  Palliative Medicine Team  Team Phone # [phone] (Nights/Weekends) 08/03/2024 9:41 AM

## 2024-08-03 NOTE — TOC Progression Note (Addendum)
 Transition of Care [facility]) - Progression Note    Patient Details  Name: Brenda [name] MRN: [id number] Date of Birth: Sep 15, 1960  Transition of Care [facility] CM/SW Contact  [name], [place] Phone Number: 08/03/2024, 10:03 AM  Clinical Narrative:   Patient continues with cortrak, palliative following for goals of care. CSW to follow for disposition when appropriate.  UPDATE: CSW spoke with [name] with [facility] (Patient's primary care provider). [facility] assisted with arranging outpatient oncology appointment with [facility] for second opinion, per family request. Appointment arranged for sometime in December. Brief update provided to primary care provider. CSW to follow.    Expected Discharge Plan:  (TBD) Barriers to Discharge: Continued Medical Work up               Expected Discharge Plan and Services                                               Social Drivers of Health (SDOH) Interventions SDOH Screenings   Food Insecurity: No Food Insecurity (07/16/2024)  Recent Concern: Food Insecurity - Food Insecurity Present (04/20/2024)  Housing: Low Risk  (07/16/2024)  Transportation Needs: No Transportation Needs (07/16/2024)  Utilities: Not At Risk (07/16/2024)  Depression (PHQ2-9): Low Risk  (06/30/2024)  Recent Concern: Depression (PHQ2-9) - Medium Risk (05/07/2024)  Financial Resource Strain: High Risk (02/10/2021)  Social Connections: Moderately Integrated (07/13/2024)  Tobacco Use: Medium Risk (07/16/2024)    Readmission Risk Interventions    07/14/2024    2:34 PM 05/04/2024   10:00 AM  Readmission Risk Prevention Plan  Transportation Screening Complete Complete  PCP or Specialist Appt within 3-5 Days  Complete  HRI or Home Care Consult  Complete  Social Work Consult for Recovery Care Planning/Counseling  Complete  Palliative Care Screening  Not Applicable  Medication Review [employer]) Complete Complete  PCP or Specialist appointment  within 3-5 days of discharge Complete   HRI or Home Care Consult Complete   SW Recovery Care/Counseling Consult Complete   Palliative Care Screening Not Applicable   Skilled Nursing Facility Not Applicable

## 2024-08-03 NOTE — Progress Notes (Signed)
 PROGRESS NOTE    Brenda [name]  FMW:[id number] DOB: [birth year] DOA: 07/15/2024 PCP: [facility]  Subjective: No new subjective & objective note has been filed under this hospital service since the last note was generated. No acute events overnight. As per chart review, it seems she got IV haldol  PRN early this morning with no documentation for the reason. Seen and examined at present with patient's pastor present. Unable to provide any history due to altered mentation/somnolence.    Hospital Course: 63 year old female with history of metastatic endometrial cancer, chronic cancer-related pain, hypertension, hyperlipidemia, diabetes mellitus, depression, anxiety, CVA, history of DVT and PE anticoagulated on Lovenox , OSA, chronic hypoxic respiratory failure on 2L via nasal cannula and recent hospitalization and discharged on 07/14/2024 for UTI treated with IV antibiotics followed by oral antibiotics on discharge presented with chills and weakness.  She was found to have UTI and was started on IV cefepime .  Unfortunately mental status worsened and she was transferred to [facility] for continuous EEG monitoring with 4 seizures noted on video EEG. Started on Keppra  11/16. Placed on empiric broad-spectrum antibiotics for possible meningitis/encephalitis which was ruled out based on CSF results. As per husband, mental status worsened since antibiotics stopped. Not able to participate with any meaningful history and following only few commands.    No improvement in mentation, developed some loose stool that was attributed to tube feed formula with no concern for infection. Nothing much to offer from oncology and they advised palliative care. Family meeting with palliative and they want her to be transferred to a tertiary care center.  Discussed with her oncologist as we do not have any diagnosis which cannot be treated at current facility, transfer will not happen for a second opinion.   Per oncology, family need to arrange themself to take her to any tertiary care center of their choice , apparently has an outpatient appointment with [facility] oncology in December. Husband still hopeful the patient will improve in terms of mental status.   Assessment and Plan:  Acute metabolic encephalopathy Subclinical seizures Waxing and waning course Encephalopathy prior to admission thought to be secondary to UTI.  Started on cefepime  with worsening mental status.  On 11/15, stiffening of upper extremities, patient transferred to [facility] for continuous video EEG. MRI brain on 11/15 is negative for acute findings.  Shows evolution of ischemic infarcts similar to before. Video EEG: 4 seizures noted on 11/16. Status post LP with acute meningitis/encephalitis panel negative and transient empiric antibiotics   - IV cefepime  could be culprit and this was discontinued. 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 - stop IV haldol  PRN  - Will continue on keppra  500 bid - pain management as elsewhere - as per oncology, no further recommendations - [facility] denied patient transfer with no bed availability  - advised patient to consider reaching out on his own to get second opinion at outside facility - discussed with palliative who have been following closely, awaiting further recs  Depression/anxiety - Prozac , and Remeron  discontinued - stop Haldol  1mg  q6h PRN for acute agitation - spoke with palliative care for assistance with symptomatic management, awaiting recs.   Acute on chronic anemia Anemia of chronic disease - Hgb 6.8 < 7.1, baseline 7-10 prior to this prolonged hospitalization - From cancer.  - No signs/symptoms of acute bleeding - continue to hold therapeutic lovenox  - will give 1 unit pRBC - Hgb goal > 7 - will check iron  studies - monitor CBC   - may need GI consult if continues to have worsening anemia despite transfusion   Endometrial cancer with  metastases Chronic cancer-related pain with opiate dependence.  - taken off fentanyl  due to somnolence - cont acetaminophen  PRN for mild pain - cont oxycodone  5mg  PRN for moderate pain - cont oxycodone  10mg  PRN for severe pain - Will touch base with palliative care for assistance with symptomatic management. - Outpatient follow-up with oncology/Dr. Lonn.   - family requested second opinion at [facility] but [facility] denied patient transfer with no bed availability   - Referral will be sent for outpatient   Acute RLL segmental PE Splenic infarct - acute PE diagnosed in 06/19/2024 - splenic infarct diagnosed in 05/2024 - hold Lovenox  given concern for acute bleed - resume if repeat CBC remains stable   History of CAD History of stroke Hyperlipidemia -Continue statin.  Not on aspirin  since she is to be on treatment dose anticoagulation - lovenox  on hold given concern for acute on chronic anemia - resume if repeat CBC remains stable   Hypernatremia.   Resolved with IV/PO hydration. Now with mild hyponatremia - decrease free water  flushes q6h to q8h - cont hydration with tube feeds - monitor BMP   UTI:  Present on admission, failed p.o. antibiotics - Blood and urine cultures negative so far.  - Antibiotics were discontinued.   Hypophosphatemia Hypokalemia  Improved with repletion - monitor and replete electrolytes as needed   Hyperthyroidism, TSH is 0.23,  free T4 level elevated 1.4.  Mild suppression of TSH.  Likely from ongoing illness.  Antithyroid medications are not indicated at this time.   - cont propranolol  to help with tremors.    Chronic respiratory failure with hypoxia OSA - Respiratory status stable.  - Continue supplemental oxygen .  - Continue CPAP at night   Hypoglycemia Diabetes mellitus type 2  - resolved with D50 - cont semglee  25mg  BID  - Continue with Lantus  and SSI   Obesity class I - Outpatient follow-up   Physical deconditioning - Likely will need  SNF pending her clinical course.  DVT prophylaxis: Place and maintain sequential compression device Start: 08/02/24 0738  SCDs   Code Status: Limited: Do not attempt resuscitation (DNR) -DNR-LIMITED -Do Not Intubate/DNI  Disposition Plan: TBD pending clinical course Reason for continuing need for hospitalization: severity of illness, enteral nutrition plan, monitor for anemia, palliative following  Objective: Vitals:   08/03/24 0445 08/03/24 0500 08/03/24 0728 08/03/24 1123  BP: (!) 143/58  126/60 (!) 106/53  Pulse: 82  90 (!) 55  Resp: 18  17 20   Temp: 98.1 F (36.7 C)  97.6 F (36.4 C) 98 F (36.7 C)  TempSrc:   Oral Oral  SpO2:   100% 100%  Weight:  82.6 kg    Height:        Intake/Output Summary (Last 24 hours) at 08/03/2024 1250 Last data filed at 08/03/2024 1100 Gross per 24 hour  Intake 3060.25 ml  Output 1300 ml  Net 1760.25 ml   Filed Weights   07/31/24 0320 08/01/24 0451 08/03/24 0500  Weight: 80.9 kg 81.6 kg 82.6 kg    Examination:  Physical Exam Vitals and nursing note reviewed.  Constitutional:      General: She is not in acute distress.    Appearance: She is ill-appearing (chronically).     Comments: Somnolent, weak, frail  HENT:     Head: Normocephalic and atraumatic.  Cardiovascular:  Rate and Rhythm: Normal rate and regular rhythm.     Pulses: Normal pulses.     Heart sounds: Normal heart sounds.  Pulmonary:     Effort: Pulmonary effort is normal.     Breath sounds: Normal breath sounds.  Abdominal:     General: Bowel sounds are normal.     Palpations: Abdomen is soft.  Neurological:     Mental Status: She is alert. She is disoriented.     Data Reviewed: I have personally reviewed following labs and imaging studies  CBC: Recent Labs  Lab 07/29/24 0430 08/02/24 0427 08/02/24 0635 08/03/24 0319  WBC 10.7* 6.3 6.5 7.4  HGB 8.4* 6.6* 7.1* 6.8*  HCT 26.1* 20.2* 21.7* 21.2*  MCV 84.5 85.6 84.4 86.2  PLT 206 216 225 269   Basic  Metabolic Panel: Recent Labs  Lab 07/29/24 0430 08/02/24 0427 08/03/24 0319  NA 136 132* 133*  K 4.2 4.1 4.3  CL 101 96* 99  CO2 27 28 28   GLUCOSE 190* 191* 204*  BUN 19 18 18   CREATININE 0.59 0.45 0.45  CALCIUM  8.6* 8.2* 8.4*  MG 2.0  --   --   PHOS 4.3  --   --    GFR: Estimated Creatinine Clearance: 73.6 mL/min (by C-G formula based on SCr of 0.45 mg/dL). Liver Function Tests: Recent Labs  Lab 07/29/24 0430  ALBUMIN 2.2*   No results for input(s): LIPASE, AMYLASE in the last 168 hours. No results for input(s): AMMONIA in the last 168 hours. Coagulation Profile: No results for input(s): INR, PROTIME in the last 168 hours. Cardiac Enzymes: No results for input(s): CKTOTAL, CKMB, CKMBINDEX, TROPONINI in the last 168 hours. ProBNP, BNP (last 5 results) Recent Labs    05/22/24 0630 06/19/24 1730  PROBNP 3,514.0*  --   BNP  --  161.6*   HbA1C: No results for input(s): HGBA1C in the last 72 hours. CBG: Recent Labs  Lab 08/02/24 1657 08/02/24 1945 08/02/24 2341 08/03/24 0743 08/03/24 1143  GLUCAP 203* 187* 197* 122* 97   Lipid Profile: No results for input(s): CHOL, HDL, LDLCALC, TRIG, CHOLHDL, LDLDIRECT in the last 72 hours. Thyroid  Function Tests: No results for input(s): TSH, T4TOTAL, FREET4, T3FREE, THYROIDAB in the last 72 hours. Anemia Panel: Recent Labs    08/03/24 0319  FERRITIN 100  TIBC 231*  IRON 29   Sepsis Labs: Recent Labs  Lab 07/30/24 1211  LATICACIDVEN 0.6    Recent Results (from the past 240 hours)  CSF culture w Gram Stain     Status: None   Collection Time: 07/24/24  1:02 PM   Specimen: PATH Cytology CSF; Cerebrospinal Fluid  Result Value Ref Range Status   Specimen Description CYTO CSF  Final   Special Requests NONE  Final   Gram Stain   Final    MODERATE WBC PRESENT, PREDOMINANTLY MONONUCLEAR NO ORGANISMS SEEN CYTOSPIN SMEAR    Culture   Final    NO GROWTH 3 DAYS Performed at  [facility], [address], [place], [place] [address]    Report Status 07/27/2024 FINAL  Final     Radiology Studies: No results found.  Scheduled Meds:  atorvastatin   80 mg Per Tube QHS   Chlorhexidine  Gluconate Cloth  6 each Topical Daily   feeding supplement (PROSource TF20)  60 mL Per Tube Daily   fiber supplement (BANATROL TF)  60 mL Per Tube TID   free water   200 mL Per Tube Q8H  insulin  aspart  0-15 Units Subcutaneous Q4H   insulin  aspart  4 Units Subcutaneous Q4H   insulin  glargine-yfgn  25 Units Subcutaneous BID   levETIRAcetam   500 mg Per Tube BID   lidocaine   1 patch Transdermal Daily   propranolol   20 mg Per Tube TID   sodium chloride  flush  10-40 mL Intracatheter Q12H   Continuous Infusions:  feeding supplement (OSMOLITE 1.5 CAL) 45 mL/hr at 08/03/24 1100     LOS: 17 days   Norval Bar, MD  [facility]  08/03/2024, 12:50 PM

## 2024-08-03 NOTE — Consult Note (Addendum)
 WOC Nurse wound follow up Refer to previous WOC consult note on 11/22.  Previously noted Deep tissue pressure injury is evolving into a Stage 2 pressure injury to the inner gluteal fold, .3X.3X.2cm, red and dry.     It is difficult to keep the affected area from becoming soiled related to frequent incontinent stools.    Topical treatment orders provided for bedside nurses to perform as follows: Foam dressing to inner gluteal fold, change Q 3 days or PRN soiling.  WOC team will reassess Q 7-10 days to determine if a change in the plan of care is indicated at that time.   Thank-you,  Stephane Fought MSN, RN, CWOCN, CWCN-AP, CNS Contact Mon-Fri 0700-1500: [phone]

## 2024-08-04 DIAGNOSIS — G9341 Metabolic encephalopathy: Secondary | ICD-10-CM | POA: Diagnosis not present

## 2024-08-04 DIAGNOSIS — Z515 Encounter for palliative care: Secondary | ICD-10-CM | POA: Diagnosis not present

## 2024-08-04 DIAGNOSIS — F418 Other specified anxiety disorders: Secondary | ICD-10-CM | POA: Diagnosis not present

## 2024-08-04 DIAGNOSIS — C541 Malignant neoplasm of endometrium: Secondary | ICD-10-CM | POA: Diagnosis not present

## 2024-08-04 LAB — BASIC METABOLIC PANEL WITH GFR
Anion gap: 6 (ref 5–15)
BUN: 15 mg/dL (ref 8–23)
CO2: 30 mmol/L (ref 22–32)
Calcium: 8.3 mg/dL — ABNORMAL LOW (ref 8.9–10.3)
Chloride: 98 mmol/L (ref 98–111)
Creatinine, Ser: 0.41 mg/dL — ABNORMAL LOW (ref 0.44–1.00)
GFR, Estimated: >60 mL/min (ref >60)
Glucose, Bld: 122 mg/dL — ABNORMAL HIGH (ref 70–99)
Potassium: 4 mmol/L (ref 3.5–5.1)
Sodium: 134 mmol/L — ABNORMAL LOW (ref 135–145)

## 2024-08-04 LAB — GLUCOSE, CAPILLARY
Glucose-Capillary: 113 mg/dL — ABNORMAL HIGH (ref 70–99)
Glucose-Capillary: 125 mg/dL — ABNORMAL HIGH (ref 70–99)
Glucose-Capillary: 132 mg/dL — ABNORMAL HIGH (ref 70–99)
Glucose-Capillary: 165 mg/dL — ABNORMAL HIGH (ref 70–99)
Glucose-Capillary: 168 mg/dL — ABNORMAL HIGH (ref 70–99)
Glucose-Capillary: 66 mg/dL — ABNORMAL LOW (ref 70–99)
Glucose-Capillary: 97 mg/dL (ref 70–99)

## 2024-08-04 LAB — CBC
HCT: 24.4 % — ABNORMAL LOW (ref 36.0–46.0)
Hemoglobin: 8 g/dL — ABNORMAL LOW (ref 12.0–15.0)
MCH: 27.7 pg (ref 26.0–34.0)
MCHC: 32.8 g/dL (ref 30.0–36.0)
MCV: 84.4 fL (ref 80.0–100.0)
Platelets: 264 K/uL (ref 150–400)
RBC: 2.89 MIL/uL — ABNORMAL LOW (ref 3.87–5.11)
RDW: 17.2 % — ABNORMAL HIGH (ref 11.5–15.5)
WBC: 6.8 K/uL (ref 4.0–10.5)
nRBC: 0 % (ref 0.0–0.2)

## 2024-08-04 MED ORDER — FREE WATER
75.0000 mL | Status: DC
  Administered 2024-08-04 – 2024-09-07 (×200): 75 mL

## 2024-08-04 MED ORDER — ENOXAPARIN (LOVENOX) PATIENT EDUCATION KIT
PACK | Freq: Once | Status: AC
  Filled 2024-08-04: qty 1

## 2024-08-04 MED ORDER — FENTANYL CITRATE (PF) 50 MCG/ML IJ SOSY: 12.5000 ug | PREFILLED_SYRINGE | INTRAMUSCULAR | Status: DC | PRN

## 2024-08-04 MED ORDER — JEVITY 1.5 CAL/FIBER PO LIQD
1000.0000 mL | ORAL | Status: DC
  Administered 2024-08-04 – 2024-08-16 (×9): 1000 mL
  Filled 2024-08-04 (×17): qty 1000

## 2024-08-04 MED ORDER — ENOXAPARIN SODIUM 80 MG/0.8ML IJ SOSY
1.0000 mg/kg | PREFILLED_SYRINGE | Freq: Two times a day (BID) | INTRAMUSCULAR | Status: DC
  Administered 2024-08-04 – 2024-08-17 (×28): 80 mg via SUBCUTANEOUS
  Filled 2024-08-04 (×28): qty 0.8

## 2024-08-04 NOTE — Progress Notes (Signed)
 Physical Therapy Treatment Patient Details Name: Brenda [name] MRN: [id number] DOB: 10-Apr-1961 Today's Date: 08/04/2024   History of Present Illness 63 yo female who returned to hospital on 07/15/2024 with ongoing signs and symptoms of UTI with generalized weakness. Pt with 4 seizures on EEG 11/16. Working diagnosis of cefepime  neurotoxicity. Prior admit 11/9 with urinary symptoms and AMS d/c home on OP ABX. PMH includes but is not limited to: HTN, HLD, DM, depression/anxiety, CVA (CIR recently), DVT/PE, OSA on CPAP, chronic respiratory failure on 2 L/min at baseline, and metastatic endometrial cancer with chronic cancer-related pain.    PT Comments  Today's session focused on B gross PROM, and noted continued gross tone. Pt able to track finger once to the R, but unable to track other directions. Noted improved vocalizations today, with pt stating yes/no at times, oh no, and groaning to indicate inc pain with PROM. Overall, therapy has been unable to progress her mobility due to limited cognition, neurological deficits, and pain control. Discussed plan of care with husband today, who states he would like PT services to continue.  Goals updated today. Continuing to recommend post-acute rehab <3hrs/day to improve activity tolerance and functional mobility.     If plan is discharge home, recommend the following: Two people to help with walking and/or transfers;Two people to help with bathing/dressing/bathroom;Assistance with cooking/housework;Assistance with feeding;Direct supervision/assist for medications management;Direct supervision/assist for financial management;Assist for transportation;Supervision due to cognitive status   Can travel by private vehicle     No  Equipment Recommendations  Hospital bed;Hoyer lift;Other (comment) Visual Merchandiser)    Recommendations for Other Services       Precautions / Restrictions Precautions Precautions: Fall;Other (comment) Precaution/Restrictions  Comments: Cortrak, B medaboots; skin integrity Restrictions Weight Bearing Restrictions Per Provider Order: No     Mobility  Bed Mobility                    Transfers                        Ambulation/Gait                   Stairs             Wheelchair Mobility     Tilt Bed    Modified Rankin (Stroke Patients Only)       Balance                                            Communication Communication Communication: Impaired Factors Affecting Communication: Difficulty expressing self (Mostly nonverbal except a few words and groans)  Cognition Arousal: Lethargic Behavior During Therapy: Flat affect   PT - Cognitive impairments: Difficult to assess Difficult to assess due to: Level of arousal                     PT - Cognition Comments: Pt answered 2-3 yes/no questions with inc time, and able to verbalize twice that PROM was hurting her. Also state oh no once during PROM Following commands: Impaired Following commands impaired: Follows one step commands inconsistently (<10% of the time)    Cueing Cueing Techniques: Verbal cues, Tactile cues  Exercises General Exercises - Upper Extremity Shoulder Flexion: PROM, Both, 10 reps, Supine Elbow Flexion: PROM, Both, 10 reps, Supine Elbow Extension: PROM, Both, 10 reps, Supine  Wrist Flexion: PROM, Both, 10 reps, Supine Wrist Extension: PROM, Both, 10 reps, Supine Digit Composite Flexion: PROM, Both, 10 reps, Supine Composite Extension: PROM, Both, 10 reps, Supine General Exercises - Lower Extremity Ankle Circles/Pumps: PROM, Both, 10 reps, Supine Short Arc Quad: PROM, Both, 10 reps, Supine Heel Slides: PROM, Both, 10 reps, Supine Other Exercises Other Exercises: Hip IR; 10 reps; PROM; supine Other Exercises: L cervical rotation; supine; PROM; 10 sec hold x2 Other Exercises: L cervical lateral flexion; 10 sec hold x 2; PROM    General Comments General  comments (skin integrity, edema, etc.): Treatment today focused on gross PROM and inc pt participation in therapy. Noted continued tone grossly, and able to track to the R once, unable to the L. Demonstrates preference for R cervical rotation, but can turn to midline with PROM.      Pertinent Vitals/Pain Pain Assessment Pain Assessment: Faces Faces Pain Scale: Hurts little more Pain Location: B hips with PROM Pain Descriptors / Indicators: Grimacing, Moaning Pain Intervention(s): Limited activity within patient's tolerance, Monitored during session    Home Living                          Prior Function            PT Goals (current goals can now be found in the care plan section) Acute Rehab PT Goals PT Goal Formulation: With family Time For Goal Achievement: 08/18/24 Potential to Achieve Goals: Fair Progress towards PT goals: Progressing toward goals    Frequency    Min 1X/week      PT Plan      Co-evaluation              AM-PAC PT 6 Clicks Mobility   Outcome Measure  Help needed turning from your back to your side while in a flat bed without using bedrails?: Total Help needed moving from lying on your back to sitting on the side of a flat bed without using bedrails?: Total Help needed moving to and from a bed to a chair (including a wheelchair)?: Total Help needed standing up from a chair using your arms (e.g., wheelchair or bedside chair)?: Total Help needed to walk in hospital room?: Total Help needed climbing 3-5 steps with a railing? : Total 6 Click Score: 6    End of Session Equipment Utilized During Treatment: Oxygen  Activity Tolerance: Other (comment) (Limited due to cognition) Patient left: in bed;with call bell/phone within reach;with family/visitor present Nurse Communication: Mobility status PT Visit Diagnosis: Other abnormalities of gait and mobility (R26.89);Muscle weakness (generalized) (M62.81);Unsteadiness on feet  (R26.81);History of falling (Z91.81)     Time: 1341-1414 PT Time Calculation (min) (ACUTE ONLY): 33 min  Charges:    $Therapeutic Exercise: 8-22 mins $Therapeutic Activity: 8-22 mins PT General Charges $$ ACUTE PT VISIT: 1 Visit                     Ondra Deboard, SPT    Ryon Layton 08/04/2024, 3:31 PM

## 2024-08-04 NOTE — Evaluation (Signed)
 Clinical/Bedside Swallow Evaluation Patient Details  Name: Brenda [name] MRN: [id number] Date of Birth: [birth year]  Today's Date: 08/04/2024 Time: SLP Start Time (ACUTE ONLY): 1424 SLP Stop Time (ACUTE ONLY): 1456 SLP Time Calculation (min) (ACUTE ONLY): 32 min  Past Medical History:  Past Medical History:  Diagnosis Date   Anemia    Arthritis    back, hands, hips (02/22/2015)   CAD (coronary artery disease)    a. complex LAD/diagonal bifurcation PCI in 2010. b. STEMI 10/2019 s/p PTCA/DES x1 to mLAD overlapping the old stent, residual disease treated medicaly.   Cancer [facility])    uterine   CHF (congestive heart failure) (HCC)    Depression    Diabetes mellitus (HCC)    started when I was pregnant; not sure if it was type 1 or type 2    History of radiation therapy    Endometrium- HDR 01/22/22-03/07/22- Dr. Lynwood Nasuti   Hypercholesterolemia    Hypertension    Morbid obesity (HCC)    Myocardial infarction (HCC)    mild x 3   Neuromuscular disorder (HCC)    neuropathy feet   Sleep apnea    cpap/   Past Surgical History:  Past Surgical History:  Procedure Laterality Date   CARDIAC CATHETERIZATION  12/02/2012   CHOLECYSTECTOMY OPEN  09/04/1987   CORONARY ANGIOPLASTY WITH STENT PLACEMENT  09/03/2009   CORONARY/GRAFT ACUTE MI REVASCULARIZATION N/A 10/26/2019   Procedure: CORONARY/GRAFT ACUTE MI REVASCULARIZATION;  Surgeon: Verlin Lonni BIRCH, MD;  Location: [facility] INVASIVE CV LAB;  Service: Cardiovascular;  Laterality: N/A;   INCISIONAL HERNIA REPAIR N/A 12/09/2021   Procedure: LAPAROSCOPIC REPAIR UMBILICAL HERNIA WITH MESH;  Surgeon: Vernetta Berg, MD;  Location: [facility] ORS;  Service: General;  Laterality: N/A;   IR IMAGING GUIDED PORT INSERTION  04/08/2024   LEFT HEART CATH AND CORONARY ANGIOGRAPHY N/A 10/26/2019   Procedure: LEFT HEART CATH AND CORONARY ANGIOGRAPHY;  Surgeon: Verlin Lonni BIRCH, MD;  Location: [facility] INVASIVE CV LAB;  Service: Cardiovascular;   Laterality: N/A;   LEFT HEART CATHETERIZATION WITH CORONARY ANGIOGRAM N/A 12/25/2012   Procedure: LEFT HEART CATHETERIZATION WITH CORONARY ANGIOGRAM;  Surgeon: Ozell Fell, MD;  Location: [facility] CATH LAB;  Service: Cardiovascular;  Laterality: N/A;   RADIOLOGY WITH ANESTHESIA N/A 04/21/2024   Procedure: MRI WITH ANESTHESIA;  Surgeon: Radiologist, Medication, MD;  Location: [facility] OR;  Service: Radiology;  Laterality: N/A;  BRAIN W/ W/O   ROBOTIC ASSISTED TOTAL HYSTERECTOMY WITH BILATERAL SALPINGO OOPHERECTOMY N/A 12/06/2021   Procedure: XI ROBOTIC ASSISTED TOTAL HYSTERECTOMY WITH BILATERAL SALPINGO OOPHORECTOMY;  Surgeon: [name] SAUNDERS, MD;  Location: [facility] ORS;  Service: Gynecology;  Laterality: N/A;   UMBILICAL HERNIA REPAIR  05/04/1989   doesn't think they used mesh   HPI:  Brenda [name] is a 63 y.o. year old presented to [facility] with chills and weakness, work up for UTI, finding of PE and transferred to [facility]. BSE 11/14 recommended Dys 3/thin. Over weekend pt had significant decline in status, alertness. MRI 11/15 revealed no acute infarcts, interval evolution of previously identified embolic infarcts. In treatment sessions pt unable to manipulate bolus, decreased alertness and awareness, no initiation of swallow and discharged with ST with request to reconsult if improvement.  MD/family requested re eval of swallow as some sedation meds have been d/c'd and showing fluctuating periods of alertness. PMH:  metastatic endometrial cancer, and chronic cancer-related pain, CVA, hypertension, hyperlipidemia, diabetes mellitus, depression, anxiety, DVT and PE , OSA, chronic hypoxic respiratory failure.    Assessment /  Plan / Recommendation  Clinical Impression  Pt alert this afternoon with pt husband at bedside. He requested SLP not perform oral care prior to po's. Pt able to verbalize and attepting to make needs known with noted confusion. Given verbal cues and extra cues pt opened mouth to accept ice chip  that fell from oral cavity. Able to take additional ice, then multiple 1/2 teaspoon sips water  with mildy appearing delay in swallow initiation. Signs of decreased airway protection with immediate but mostly delayed mild throat clears and intermittently wet vocal quality. Discussed options with pt and husband re: instrumental assessment if desired versus allowing pt to have po's as desired. He stated he would like to hold off and have ST do po trials at bedside however Palliative care NP messaged this SLP stating husband wanted to proceed with MBS. SLP will plan to check on her in the morning to see if she is alert enough. Continue NPO and oral care for now. SLP Visit Diagnosis: Dysphagia, unspecified (R13.10)    Aspiration Risk  Moderate aspiration risk;Severe aspiration risk    Diet Recommendation           Other Recommendations Oral Care Recommendations: Oral care QID     Swallow Evaluation Recommendations Recommendations: NPO Medication Administration: Via alternative means   Assistance Recommended at Discharge    Functional Status Assessment Patient has had a recent decline in their functional status and demonstrates the ability to make significant improvements in function in a reasonable and predictable amount of time.  Frequency and Duration min 2x/week  2 weeks       Prognosis Prognosis for improved oropharyngeal function: Fair Barriers to Reach Goals: Cognitive deficits      Swallow Study   General HPI: Brenda [name] is a 63 y.o. year old presented to [facility] with chills and weakness, work up for UTI, finding of PE and transferred to [facility]. BSE 11/14 recommended Dys 3/thin. Over weekend pt had significant decline in status, alertness. MRI 11/15 revealed no acute infarcts, interval evolution of previously identified embolic infarcts. In treatment sessions pt unable to manipulate bolus, decreased alertness and awareness, no initiation of swallow and discharged with ST with request  to reconsult if improvement.  MD/family requested re eval of swallow as some sedation meds have been d/c'd and showing fluctuating periods of alertness. PMH:  metastatic endometrial cancer, and chronic cancer-related pain, CVA, hypertension, hyperlipidemia, diabetes mellitus, depression, anxiety, DVT and PE , OSA, chronic hypoxic respiratory failure. Type of Study: Bedside Swallow Evaluation Previous Swallow Assessment:  (see HPI) Diet Prior to this Study: NPO;Cortrak/Small bore NG tube Temperature Spikes Noted: No Respiratory Status: Room air History of Recent Intubation: No Behavior/Cognition: Alert;Cooperative;Requires cueing;Confused Oral Cavity Assessment:  (husband requested I not do oral care before po's) Oral Care Completed by SLP: No (husband requested I not do oral care before po's) Oral Cavity - Dentition: Adequate natural dentition Vision: Functional for self-feeding Self-Feeding Abilities: Total assist Patient Positioning: Upright in bed Baseline Vocal Quality: Normal    Oral/Motor/Sensory Function Overall Oral Motor/Sensory Function: Generalized oral weakness   Ice Chips Ice chips: Impaired Presentation: Spoon Oral Phase Impairments: Reduced labial seal Oral Phase Functional Implications: Right anterior spillage (one ice chip fell out of mouth) Pharyngeal Phase Impairments:  (too small amount to tigger swallow)   Thin Liquid Thin Liquid: Impaired Presentation: Spoon Oral Phase Impairments: Reduced labial seal Oral Phase Functional Implications: Right anterior spillage Pharyngeal  Phase Impairments: Wet Vocal Quality;Cough - Immediate;Cough - Delayed  Nectar Thick Nectar Thick Liquid: Not tested   Honey Thick Honey Thick Liquid: Not tested   Puree Puree: Not tested   Solid     Solid: Not tested      Dustin Olam Bull 08/04/2024,3:40 PM

## 2024-08-04 NOTE — Progress Notes (Signed)
 Daily Progress Note   Date: 08/04/2024   Patient Name: Brenda [name]  DOB: Mar 13, 1961  MRN: [id number]  Age / Sex: 63 y.o., female  Attending Physician: Cosette Blackwater, MD Primary Care Physician: [facility] Admit Date: 07/15/2024 Length of Stay: 18 days  Reason for Follow-up: Establishing goals of care  Past Medical History:  Diagnosis Date   Anemia    Arthritis    back, hands, hips (02/22/2015)   CAD (coronary artery disease)    a. complex LAD/diagonal bifurcation PCI in 2010. b. STEMI 10/2019 s/p PTCA/DES x1 to mLAD overlapping the old stent, residual disease treated medicaly.   Cancer [facility])    uterine   CHF (congestive heart failure) (HCC)    Depression    Diabetes mellitus (HCC)    started when I was pregnant; not sure if it was type 1 or type 2    History of radiation therapy    Endometrium- HDR 01/22/22-03/07/22- Dr. Lynwood Nasuti   Hypercholesterolemia    Hypertension    Morbid obesity (HCC)    Myocardial infarction (HCC)    mild x 3   Neuromuscular disorder (HCC)    neuropathy feet   Sleep apnea    cpap/    Assessment & Plan:   HPI/Patient Profile:  63 y.o. 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   Palliative medicine consulted for goals of care conversation.   SUMMARY OF RECOMMENDATIONS DNR-limited Family desires to continue efforts to improve mentation, treat the treatable, and generally hope for a good outcome while also understanding concern for poor prognosis and lack of other treatment options Decreased dose of Oxycodone  to 2.5 mg Q3H PRN for acute pain per tube Schedule Oxycodone  10 mg at bedtime Scheduled Tylenol  1,000 mg TID  Scheduled Fluoxetine  10mg  daily for depression Ongoing goals of care discussions pending clinical course. Husband and  daughter will attempt to discuss with patient regarding artificial nutrition/PEG. Advised against this  PMT will continue to follow and support Notified primary team about family's concern for right arm forward flexion and request for neurology to be re-consulted  Symptom Management:  As above  Code Status: DNR - Limited (DNR/DNI)  Prognosis: Unable to determine  Discharge Planning: To Be Determined  Discussed with: Tariq MD about symptom management, discussed family's request for incidental pain management with movement and turns as well as the family's request for neurology to be re-consulted for family's concern for right arm flexion.  Subjective:   Subjective: Chart Reviewed. Updates received. Patient Assessed. Created space and opportunity for patient  and family to explore thoughts and feelings regarding current medical situation.  Per event log for medication administration, patient received scheduled oxycodone  10 mg x1 and oxycodone  2.5 mg PRN x4 in the last 24 hours. Improved mentation and alertness since medication adjustment.   Today's Discussion: Met with patient and husband at the bedside. Patient more alert than the last 2 weeks. Eyes were open, answered some questions including saying yes if she was in pain and responding with okay when notifying patient that palliative medicine team will return tomorrow.   Brenda [name] shared his concerns for the patient's semi-persistent right arm forward flexion at the shoulder and the incidental pain patient experiences when she participates in therapy. Brenda [name] inquired about neurology being re-consulted of the new arm flexion. He also inquired about starting fentanyl  for incidental  pain in addition to the PRN oxycodone . Discussed with him the concern for increased somnolence with fentanyl  as the patient is more alert after stopping PRN haldol  and fentanyl , we are reluctant to restart that medication. Plan for pain management is to  preemptively medicate prior to big turns for bowel movements.   Brenda [name] also shared that the patient has been saying that she is hungry and discussed with him about re-engaging speech therapy to evaluate appropriateness of PO intake as patient is more alert compared to the prior 2 weeks.   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     Comments: Eyes open compared to prior visits.   HENT:     Head: Normocephalic.     Nose: Nose normal.     Mouth/Throat:     Mouth: Mucous membranes are dry.  Cardiovascular:     Rate and Rhythm: Normal rate.  Pulmonary:     Effort: Pulmonary effort is normal.  Skin:    General: Skin is warm.    Palliative Assessment/Data: 10%   Thank you for allowing us  to participate in the care of Brenda [name] PMT will continue to support holistically.  I personally spent a total of 35 minutes in the care of the patient today including preparing to see the patient, getting/reviewing separately obtained history, performing a medically appropriate exam/evaluation, counseling and educating, referring and communicating with other health care professionals, and documenting clinical information in the EHR.  Brenda [name] Brenda [name]  Palliative Medicine Team  Team Phone # [phone] (Nights/Weekends) 08/04/2024 2:40 PM

## 2024-08-04 NOTE — Progress Notes (Addendum)
 PROGRESS NOTE    Marc Sivertsen  FMW:[id number] DOB: 1961/02/19 DOA: 07/15/2024 PCP: [facility]  Subjective:  No acute events overnight. Seen and examined at bedside with daughter present. More awake today during encounter. Able to answer some simple questions and follow some commands.    Hospital Course: 63 year old female with history of metastatic endometrial cancer, chronic cancer-related pain, hypertension, hyperlipidemia, diabetes mellitus, depression, anxiety, CVA, history of DVT and PE anticoagulated on Lovenox , OSA, chronic hypoxic respiratory failure on 2L via nasal cannula and recent hospitalization and discharged on 07/14/2024 for UTI treated with IV antibiotics followed by oral antibiotics on discharge presented with chills and weakness.  She was found to have UTI and was started on IV cefepime .  Unfortunately mental status worsened and she was transferred to [facility] for continuous EEG monitoring with 4 seizures noted on video EEG. Started on Keppra  11/16. Placed on empiric broad-spectrum antibiotics for possible meningitis/encephalitis which was ruled out based on CSF results. As per husband, mental status worsened since antibiotics stopped. Not able to participate with any meaningful history and following only few commands.    No improvement in mentation, developed some loose stool that was attributed to tube feed formula with no concern for infection. Nothing much to offer from oncology and they advised palliative care. Family meeting with palliative and they want her to be transferred to a tertiary care center.  Discussed with her oncologist as we do not have any diagnosis which cannot be treated at current facility, transfer will not happen for a second opinion.  Per oncology, family need to arrange themself to take her to any tertiary care center of their choice , apparently has an outpatient appointment with [facility] oncology in December. Husband still hopeful  the patient will improve in terms of mental status.     Assessment and Plan:  Acute metabolic encephalopathy Subclinical seizures Waxing and waning course Encephalopathy prior to admission thought to be secondary to UTI.  Started on cefepime  with worsening mental status.  On 11/15, stiffening of upper extremities, patient transferred to [facility] for continuous video EEG. MRI brain on 11/15 is negative for acute findings.  Shows evolution of ischemic infarcts similar to before. Video EEG: 4 seizures noted on 11/16. Status post LP with acute meningitis/encephalitis panel negative and transient empiric antibiotics   - IV cefepime  could be culprit and this was discontinued. 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 - stopped IV haldol  PRN  - Will continue on keppra  500 bid - pain management as elsewhere - as per oncology, no further recommendations - [facility] denied patient transfer with no bed availability  - advised patient to consider reaching out on his own to get second opinion at outside facility - palliative following   Depression/anxiety - Prozac  40mg , and Remeron  discontinued earlier this admission due to concern for lethargy/somnolence - stopped Haldol  1mg  q6h PRN due to concern for lethargy/somnolence - started prozac  10mg  every day as per palliative recs - palliative following for symptomatic management   Acute on chronic anemia Iron deficiency - patient has history of anemia of chronic disease - Hgb 8 < 6.8, baseline 7-10 prior to this prolonged hospitalization - From cancer.  - No signs/symptoms of acute bleeding - continue to hold therapeutic lovenox  - given 1 unit pRBC on 12/1 - Hgb goal > 7 - iron studies concerning for iron deficiency - plan for IV iron for 5 day couse to end 12/5 -  monitor CBC   - may need GI consult if continues to have worsening anemia despite transfusion and IV iron   Endometrial cancer with metastases Chronic  cancer-related pain with opiate dependence.  - taken off fentanyl  due to somnolence - cont acetaminophen  PRN for mild pain - decreased oxycodone  5mg  q6h to 2.5mg  q3h PRN for moderate pain as per palliative recs -changed oxycodone  10mg  PRN to at bedtime scheduled as per palliative recs  - palliative care following for symptomatic pain management. - Outpatient follow-up with oncology/Dr. Lonn.   - family requested second opinion at [facility] but [facility] denied patient transfer with no bed availability   - Referral will be sent for outpatient   Acute RLL segmental PE Splenic infarct - acute PE diagnosed in 06/19/2024 - splenic infarct diagnosed in 05/2024 - resume Lovenox  given Hgb stable and no signs/symptoms for acute bleed - monitor CBC    History of CAD History of stroke Hyperlipidemia -Continue statin.  Not on aspirin  since she is to be on treatment dose anticoagulation - resume Lovenox  given Hgb stable and no signs/symptoms for acute bleed   Hypernatremia   Resolved with IV/PO hydration. Now with mild hyponatremia - cont free water  flushes q8h - cont hydration with tube feeds - monitor BMP   UTI:  Present on admission, failed p.o. antibiotics - Blood and urine cultures negative so far.  - Antibiotics were discontinued.   Hypophosphatemia Hypokalemia  Improved with repletion - monitor and replete electrolytes as needed   Hyperthyroidism TSH is 0.23,  free T4 level elevated 1.4.  Mild suppression of TSH.  Likely from ongoing illness.  Antithyroid medications are not indicated at this time.   - cont propranolol  to help with tremors.    Chronic respiratory failure with hypoxia OSA - Respiratory status stable.  - Continue supplemental oxygen .  - Continue CPAP at night   Hypoglycemia Diabetes mellitus type 2  - resolved with D50 - cont semglee  25mg  BID  - Continue with Lantus  and SSI   Obesity class I - Outpatient follow-up  Pressure injury to sacrum stage 2, not  present on admission  - cont local wound care as per wound care team   Physical deconditioning - Likely will need SNF pending her clinical course.  DVT prophylaxis: Place and maintain sequential compression device Start: 08/02/24 0738  Lovenox    Code Status: Limited: Do not attempt resuscitation (DNR) -DNR-LIMITED -Do Not Intubate/DNI  Family Communication: updated daughter at bedside Disposition Plan: TBD pending clinical course Reason for continuing need for hospitalization: severity of illness, enteral nutrition plan, monitor for anemia, palliative recommendations  Objective: Vitals:   08/04/24 0032 08/04/24 0406 08/04/24 0500 08/04/24 0816  BP: (!) 107/59 (!) 143/53  (!) 152/59  Pulse: 70 76  90  Resp: 17 17  16   Temp: 97.9 F (36.6 C) 97.8 F (36.6 C)  98.3 F (36.8 C)  TempSrc:    Oral  SpO2: 100% 100%  100%  Weight:   81.2 kg   Height:        Intake/Output Summary (Last 24 hours) at 08/04/2024 1101 Last data filed at 08/04/2024 0849 Gross per 24 hour  Intake 1909.61 ml  Output 950 ml  Net 959.61 ml   Filed Weights   08/01/24 0451 08/03/24 0500 08/04/24 0500  Weight: 81.6 kg 82.6 kg 81.2 kg    Examination:  Physical Exam Vitals and nursing note reviewed.  Constitutional:      General: She is not in acute distress.  Appearance: She is ill-appearing.     Comments: More awake and alert, weak, frail  HENT:     Head: Normocephalic and atraumatic.  Cardiovascular:     Rate and Rhythm: Normal rate and regular rhythm.     Pulses: Normal pulses.     Heart sounds: Normal heart sounds.  Pulmonary:     Effort: Pulmonary effort is normal.     Breath sounds: Normal breath sounds.  Abdominal:     General: Bowel sounds are normal.     Palpations: Abdomen is soft.     Data Reviewed: I have personally reviewed following labs and imaging studies  CBC: Recent Labs  Lab 07/29/24 0430 08/02/24 0427 08/02/24 0635 08/03/24 0319 08/04/24 0555  WBC 10.7* 6.3 6.5  7.4 6.8  HGB 8.4* 6.6* 7.1* 6.8* 8.0*  HCT 26.1* 20.2* 21.7* 21.2* 24.4*  MCV 84.5 85.6 84.4 86.2 84.4  PLT 206 216 225 269 264   Basic Metabolic Panel: Recent Labs  Lab 07/29/24 0430 08/02/24 0427 08/03/24 0319 08/04/24 0555  NA 136 132* 133* 134*  K 4.2 4.1 4.3 4.0  CL 101 96* 99 98  CO2 27 28 28 30   GLUCOSE 190* 191* 204* 122*  BUN 19 18 18 15   CREATININE 0.59 0.45 0.45 0.41*  CALCIUM  8.6* 8.2* 8.4* 8.3*  MG 2.0  --   --   --   PHOS 4.3  --   --   --    GFR: Estimated Creatinine Clearance: 72.9 mL/min (A) (by C-G formula based on SCr of 0.41 mg/dL (L)). Liver Function Tests: Recent Labs  Lab 07/29/24 0430  ALBUMIN 2.2*   No results for input(s): LIPASE, AMYLASE in the last 168 hours. No results for input(s): AMMONIA in the last 168 hours. Coagulation Profile: No results for input(s): INR, PROTIME in the last 168 hours. Cardiac Enzymes: No results for input(s): CKTOTAL, CKMB, CKMBINDEX, TROPONINI in the last 168 hours. ProBNP, BNP (last 5 results) Recent Labs    05/22/24 0630 06/19/24 1730  PROBNP 3,514.0*  --   BNP  --  161.6*   HbA1C: No results for input(s): HGBA1C in the last 72 hours. CBG: Recent Labs  Lab 08/03/24 1625 08/03/24 2012 08/04/24 0031 08/04/24 0406 08/04/24 0748  GLUCAP 115* 132* 165* 132* 113*   Lipid Profile: No results for input(s): CHOL, HDL, LDLCALC, TRIG, CHOLHDL, LDLDIRECT in the last 72 hours. Thyroid  Function Tests: No results for input(s): TSH, T4TOTAL, FREET4, T3FREE, THYROIDAB in the last 72 hours. Anemia Panel: Recent Labs    08/03/24 0319  FERRITIN 100  TIBC 231*  IRON 29   Sepsis Labs: Recent Labs  Lab 07/30/24 1211  LATICACIDVEN 0.6    No results found for this or any previous visit (from the past 240 hours).   Radiology Studies: No results found.  Scheduled Meds:  acetaminophen   1,000 mg Per Tube TID   atorvastatin   80 mg Per Tube QHS   Chlorhexidine   Gluconate Cloth  6 each Topical Daily   enoxaparin  (LOVENOX ) injection  1 mg/kg Subcutaneous Q12H   feeding supplement (PROSource TF20)  60 mL Per Tube Daily   FLUoxetine   10 mg Per Tube Daily   free water   75 mL Per Tube Q4H   insulin  aspart  0-15 Units Subcutaneous Q4H   insulin  aspart  4 Units Subcutaneous Q4H   insulin  glargine-yfgn  25 Units Subcutaneous BID   levETIRAcetam   500 mg Per Tube BID   lidocaine   1 patch Transdermal Daily  oxyCODONE   10 mg Oral QHS   propranolol   20 mg Per Tube TID   sodium chloride  flush  10-40 mL Intracatheter Q12H   Continuous Infusions:  feeding supplement (JEVITY 1.5 CAL/FIBER)     iron sucrose 200 mg (08/03/24 2230)     LOS: 18 days   Norval Bar, MD  [facility]  08/04/2024, 11:01 AM

## 2024-08-04 NOTE — Progress Notes (Signed)
 Nutrition Follow-up  DOCUMENTATION CODES:  Non-severe (moderate) malnutrition in context of chronic illness  INTERVENTION:  Adjust to the following tube feeding via cortrak: Jevity 1.5 at 45 ml/h (1080 ml per day) Prosource TF20 60 ml 1x/d Free water : 200mL every 4 hours per MD Provides 1700 kcal, 88 gm protein, 821 ml free water  daily ( fee water  TF+flush) Monitor mentation for ability to take in a PO diet, SLP will need to have new consult placed when pt better able to participate. Monitor GOC discussions  NUTRITION DIAGNOSIS:  Moderate Malnutrition related to chronic illness (cancer) as evidenced by percent weight loss, mild muscle depletion, moderate muscle depletion (12% x 6 months). - remains applicable  GOAL:  Patient will meet greater than or equal to 90% of their needs - being addressed with tube feeds  MONITOR:  Weight trends, TF tolerance, I & O's, Labs, Diet advancement  REASON FOR ASSESSMENT:  Consult Enteral/tube feeding initiation and management  ASSESSMENT:  Pt with hx of HTN, HLD, CAD, CHF, hx CVA, uterine cancer s/p hysterectomy and adjuvant radiation therapy in 2023 with recurrance with mets in July 2025, hx MI x 3, and DM type 2 presented to [facility] with weakness after a recent admission from 11/9-11/11. Pt had seizures during admission and was transferred to [facility] for continuous EEG.  11/13 - presented to [facility] with chills and weakness after recent admission to [facility] 11/15 - transferred to [facility] 11/19 - cortrak placed (gastric)  Pt with minimal clinical changes since last assessment. Family still working with PMT.   Reviewed chart and pt continues to have type 6 stools 1-3 times a day. Noted that per WOC, DTI is evolving into stage 2 at the gluteal cleft, being attributed to frequent stools and moisture. Discussed with RN, will adjust to a fiber containing formula and d/c added fiber which can be added back slowly if  stools do not become more solid. SLP not working with pt currently and once pt more appropriate will be reconsulted. Will continue to monitor for longterm feeding plan and adjust regimen accordingly.   Admit weight: 77.9 kg Current weight: 81.2 kg   12% weight loss noted in the last 6 months (since cancer recurrence) which is severe for timeframe  Nutritionally Relevant Medications: Scheduled Meds:  atorvastatin   80 mg Per Tube QHS   PROSource TF20  60 mL Per Tube Daily   BANATROL TF  60 mL Per Tube TID   free water   200 mL Per Tube Q8H   insulin  aspart  0-15 Units Subcutaneous Q4H   insulin  aspart  4 Units Subcutaneous Q4H   insulin  glargine-yfgn  25 Units Subcutaneous BID   levETIRAcetam   500 mg Per Tube BID   Continuous Infusions:  feeding supplement (OSMOLITE 1.5 CAL) 45 mL/hr at 08/04/24 0849   iron sucrose 200 mg (08/03/24 2230)   PRN Meds: loperamide  HCl, ondansetron , prochlorperazine , senna-docusate  Labs Reviewed: CBG ranges from 126-201 mg/dL over the last 24 hours HgbA1c 7.2% (06/01/24)  NUTRITION - FOCUSED PHYSICAL EXAM: Flowsheet Row Most Recent Value  Orbital Region No depletion  Upper Arm Region No depletion  Thoracic and Lumbar Region No depletion  Buccal Region No depletion  Temple Region Mild depletion  Clavicle Bone Region Mild depletion  Clavicle and Acromion Bone Region Mild depletion  Scapular Bone Region Mild depletion  Dorsal Hand No depletion  Patellar Region Moderate depletion  Anterior Thigh Region Moderate depletion  Posterior Calf Region Moderate  depletion  Edema (RD Assessment) Moderate  [BLE, BUE]  Hair Reviewed  Eyes Reviewed  Mouth Reviewed  Skin Reviewed  Nails Reviewed    Diet Order:   Diet Order             Diet NPO time specified  Diet effective now                   EDUCATION NEEDS:  Education needs have been addressed  Skin:  Skin Assessment: Reviewed RN Assessment  Last BM:  12/1 - type 6 x 3  Height:  Ht  Readings from Last 1 Encounters:  07/16/24 5' 2 (1.575 m)    Weight:  Wt Readings from Last 1 Encounters:  08/04/24 81.2 kg    Ideal Body Weight:  50 kg  BMI:  Body mass index is 32.74 kg/m.  Estimated Nutritional Needs:  Kcal:  1600-1800 kcal/d Protein:  75-90 g/d Fluid:  >/=1.6L/d    Brenda [name], RD, LDN, CNSC Registered Dietitian II Please reach out via secure chat

## 2024-08-05 ENCOUNTER — Inpatient Hospital Stay

## 2024-08-05 DIAGNOSIS — F418 Other specified anxiety disorders: Secondary | ICD-10-CM | POA: Diagnosis not present

## 2024-08-05 DIAGNOSIS — J9611 Chronic respiratory failure with hypoxia: Secondary | ICD-10-CM | POA: Diagnosis not present

## 2024-08-05 DIAGNOSIS — G9341 Metabolic encephalopathy: Secondary | ICD-10-CM | POA: Diagnosis not present

## 2024-08-05 DIAGNOSIS — G893 Neoplasm related pain (acute) (chronic): Secondary | ICD-10-CM | POA: Diagnosis not present

## 2024-08-05 LAB — GLUCOSE, CAPILLARY
Glucose-Capillary: 110 mg/dL — ABNORMAL HIGH (ref 70–99)
Glucose-Capillary: 117 mg/dL — ABNORMAL HIGH (ref 70–99)
Glucose-Capillary: 142 mg/dL — ABNORMAL HIGH (ref 70–99)
Glucose-Capillary: 184 mg/dL — ABNORMAL HIGH (ref 70–99)
Glucose-Capillary: 197 mg/dL — ABNORMAL HIGH (ref 70–99)
Glucose-Capillary: 214 mg/dL — ABNORMAL HIGH (ref 70–99)
Glucose-Capillary: 244 mg/dL — ABNORMAL HIGH (ref 70–99)

## 2024-08-05 LAB — BASIC METABOLIC PANEL WITH GFR
Anion gap: 10 (ref 5–15)
BUN: 15 mg/dL (ref 8–23)
CO2: 31 mmol/L (ref 22–32)
Calcium: 8.9 mg/dL (ref 8.9–10.3)
Chloride: 96 mmol/L — ABNORMAL LOW (ref 98–111)
Creatinine, Ser: 0.44 mg/dL (ref 0.44–1.00)
GFR, Estimated: >60 mL/min (ref >60)
Glucose, Bld: 133 mg/dL — ABNORMAL HIGH (ref 70–99)
Potassium: 4.3 mmol/L (ref 3.5–5.1)
Sodium: 137 mmol/L (ref 135–145)

## 2024-08-05 LAB — CBC
HCT: 29.2 % — ABNORMAL LOW (ref 36.0–46.0)
Hemoglobin: 9.5 g/dL — ABNORMAL LOW (ref 12.0–15.0)
MCH: 27.8 pg (ref 26.0–34.0)
MCHC: 32.5 g/dL (ref 30.0–36.0)
MCV: 85.4 fL (ref 80.0–100.0)
Platelets: 340 K/uL (ref 150–400)
RBC: 3.42 MIL/uL — ABNORMAL LOW (ref 3.87–5.11)
RDW: 17.2 % — ABNORMAL HIGH (ref 11.5–15.5)
WBC: 8.3 K/uL (ref 4.0–10.5)
nRBC: 0 % (ref 0.0–0.2)

## 2024-08-05 NOTE — Progress Notes (Signed)
 Occupational Therapy Treatment Patient Details Name: Brenda [name] MRN: [id number] DOB: 1961/01/22 Today's Date: 08/05/2024   History of present illness 63 yo female who returned to hospital on 07/15/2024 with ongoing signs and symptoms of UTI with generalized weakness. Pt with 4 seizures on EEG 11/16. Working diagnosis of cefepime  neurotoxicity. Prior admit 11/9 with urinary symptoms and AMS d/c home on OP ABX. PMH includes but is not limited to: HTN, HLD, DM, depression/anxiety, CVA (CIR recently), DVT/PE, OSA on CPAP, chronic respiratory failure on 2 L/min at baseline, and metastatic endometrial cancer with chronic cancer-related pain.   OT comments  Pt greeted in supine, supportive daughter at bedside. Pt with fluctuating wakefulness this session. She participated in simple bed level UE exercises with fair tolerance, mostly PROM as pt following 1-step commands < 10% of the time. She was max A for bed level grooming via hand-over-hand for initiation. Pt continues to demonstrate very slow progress towards OT goals. Discussed delirium prevention strategies with daughter. Will continue to follow.      If plan is discharge home, recommend the following:  Two people to help with walking and/or transfers;Two people to help with bathing/dressing/bathroom;Assistance with cooking/housework;Assistance with feeding;Direct supervision/assist for medications management;Direct supervision/assist for financial management;Assist for transportation;Help with stairs or ramp for entrance;Supervision due to cognitive status   Equipment Recommendations  Hospital bed;Hoyer lift    Recommendations for Other Services      Precautions / Restrictions Precautions Precautions: Fall;Other (comment) Recall of Precautions/Restrictions: Impaired Precaution/Restrictions Comments: Cortrak, B medaboots; skin integrity Restrictions Weight Bearing Restrictions Per Provider Order: No       Mobility Bed Mobility                     Transfers                         Balance                                           ADL either performed or assessed with clinical judgement   ADL Overall ADL's : Needs assistance/impaired     Grooming: Maximal assistance;Bed level;Wash/dry face Grooming Details (indicate cue type and reason): hand-over-hand using R hnad to guide washcloth to face, assist for thoroughness                                    Extremity/Trunk Assessment              Vision   Additional Comments: pt with eyes open intermittently during session, not fixating on visual stim t/o   Perception     Praxis     Communication Communication Communication: Impaired Factors Affecting Communication: Difficulty expressing self (speaking a few non-sensical words and that were irrelevant to conversation/questions)   Cognition Arousal: Lethargic Behavior During Therapy: Flat affect Cognition: Cognition impaired   Orientation impairments:  (UTA)     Attention impairment (select first level of impairment): Focused attention   OT - Cognition Comments: fluctuating level of wakefulness during session, eyes open towards end of session, but pt not fixating on visual stim or consistently following commands to turn head/look around room                 Following commands: Impaired Following commands  impaired: Follows one step commands inconsistently, Follows one step commands with increased time (< 10% of the time)      Cueing   Cueing Techniques: Verbal cues, Tactile cues  Exercises Exercises: General Upper Extremity General Exercises - Upper Extremity Shoulder Flexion: PROM, Both, 10 reps, Supine Elbow Flexion: PROM, Both, 10 reps, Supine Elbow Extension: PROM, Both, 10 reps, Supine Digit Composite Flexion: PROM, Both, 10 reps, Supine Composite Extension: PROM, Both, 10 reps, Supine    Shoulder Instructions       General  Comments supportive daughter present, educated on delirium prevention strategies and techniques to implement    Pertinent Vitals/ Pain       Pain Assessment Pain Assessment: Faces Faces Pain Scale: Hurts a little bit Pain Location: generalized Pain Descriptors / Indicators: Grimacing Pain Intervention(s): Limited activity within patient's tolerance, Monitored during session  Home Living                                          Prior Functioning/Environment              Frequency  Min 1X/week        Progress Toward Goals  OT Goals(current goals can now be found in the care plan section)  Progress towards OT goals: Not progressing toward goals - comment     Plan      Co-evaluation                 AM-PAC OT 6 Clicks Daily Activity     Outcome Measure   Help from another person eating meals?: Total Help from another person taking care of personal grooming?: A Lot Help from another person toileting, which includes using toliet, bedpan, or urinal?: Total Help from another person bathing (including washing, rinsing, drying)?: Total Help from another person to put on and taking off regular upper body clothing?: Total Help from another person to put on and taking off regular lower body clothing?: Total 6 Click Score: 7    End of Session    OT Visit Diagnosis: Muscle weakness (generalized) (M62.81);Low vision, both eyes (H54.2);Feeding difficulties (R63.3);Cognitive communication deficit (R41.841) Symptoms and signs involving cognitive functions: Cerebral infarction   Activity Tolerance Patient limited by lethargy   Patient Left in bed;with call bell/phone within reach;with bed alarm set;with family/visitor present   Nurse Communication Other (comment) (status of session)        Time: 8990-8972 OT Time Calculation (min): 18 min  Charges: OT General Charges $OT Visit: 1 Visit OT Treatments $Therapeutic Exercise: 8-22 mins  Shanika Levings  M. [place], OTR/L [facility] [phone] Secure Chat Preferred  Sara Keys 08/05/2024, 1:40 PM

## 2024-08-05 NOTE — Progress Notes (Signed)
 Speech Language Pathology Patient Details Name: Caroleena Paolini MRN: [id number] DOB: February 02, 1961 Today's Date: 08/05/2024 Time:  -     Checked on pt for potential MBS today. Pt just received pain meds and is unable to participate. Dr. Drusilla and daughter present and plan is for [facility] tomorrow prior to receiving pain meds. Spoke to RN who will pass this along to oncoming staff and daughter will be present in am to help remind pt should not get pain meds in the am.     Dustin Olam Bull  08/05/2024, 9:28 AM

## 2024-08-05 NOTE — Plan of Care (Signed)
  Problem: Fluid Volume: Goal: Ability to maintain a balanced intake and output will improve Outcome: Progressing   Problem: Education: Goal: Ability to describe self-care measures that may prevent or decrease complications (Diabetes Survival Skills Education) will improve Outcome: Not Progressing   Problem: Health Behavior/Discharge Planning: Goal: Ability to manage health-related needs will improve Outcome: Not Progressing

## 2024-08-05 NOTE — Plan of Care (Signed)
  Problem: Education: Goal: Ability to describe self-care measures that may prevent or decrease complications (Diabetes Survival Skills Education) will improve Outcome: Not Progressing   Problem: Health Behavior/Discharge Planning: Goal: Ability to manage health-related needs will improve Outcome: Not Progressing   Problem: Metabolic: Goal: Ability to maintain appropriate glucose levels will improve Outcome: Not Progressing   Problem: Education: Goal: Knowledge of General Education information will improve Description: Including pain rating scale, medication(s)/side effects and non-pharmacologic comfort measures Outcome: Not Progressing

## 2024-08-05 NOTE — Progress Notes (Signed)
 Daily Progress Note   Date: 08/05/2024   Patient Name: Brenda [name]  DOB: 20-Jan-1961  MRN: [id number]  Age / Sex: 63 y.o., female  Attending Physician: Drusilla Sabas RAMAN, MD Primary Care Physician: [facility] Admit Date: 07/15/2024 Length of Stay: 19 days  Reason for Follow-up: Establishing goals of care  Past Medical History:  Diagnosis Date   Anemia    Arthritis    back, hands, hips (02/22/2015)   CAD (coronary artery disease)    a. complex LAD/diagonal bifurcation PCI in 2010. b. STEMI 10/2019 s/p PTCA/DES x1 to mLAD overlapping the old stent, residual disease treated medicaly.   Cancer [facility])    uterine   CHF (congestive heart failure) (HCC)    Depression    Diabetes mellitus (HCC)    started when I was pregnant; not sure if it was type 1 or type 2    History of radiation therapy    Endometrium- HDR 01/22/22-03/07/22- Dr. Lynwood Nasuti   Hypercholesterolemia    Hypertension    Morbid obesity (HCC)    Myocardial infarction (HCC)    mild x 3   Neuromuscular disorder (HCC)    neuropathy feet   Sleep apnea    cpap/    Assessment & Plan:   HPI/Patient Profile:  63 y.o. 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   Palliative medicine consulted for goals of care conversation.  SUMMARY OF RECOMMENDATIONS DNR-limited Continue efforts to improve mentation and treat reversible causes at this time Follow up with MBS result on 12/4 and discussion about PEG tube in the coming days PMT will continue to follow and support Preemptive medication with turns Symptom management as below  Symptom Management:  Tylenol  1000 mg TID for pain Fluoxetine  10 mg daily for depression Oxycodone  10 mg nightly for pain Oxycodone  2.5 mg PRN Q3 for breakthrough pain  Code Status: DNR - Limited  (DNR/DNI)  Prognosis: Unable to determine  Discharge Planning: To Be Determined  Discussed with: Lama MD about continued improvement in patient's mentation compared to prior visits and plan to discuss with family about PEG tube.  Subjective:   Subjective: Chart Reviewed. Updates received. Patient Assessed. Created space and opportunity for patient  and family to explore thoughts and feelings regarding current medical situation.  Today's Discussion:  Visited patient with husband at the bedside. Patient was somnolent and kept eyes closed during visit but was able to answer with yes/no and other one word responses when inquired about pain and discomfort. Husband is content with current pain regimen. Discussed aborted MBS this morning due to sedation after administration of oxycodone  and plan to attempt tomorrow when patient is more alert.   Husband shared that although the patient's current mentation is improved, it is not back to the baseline prior to hospitalization in early 07/2024. Shared videos of patient awake, alert and participating in birthday activities. Husband also shared a video from a year prior with the patient speaking in fluid sentences.   Review of Systems  Musculoskeletal:  Negative for back pain.    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     Comments: Somnolent but arouses to  voice.   HENT:     Head: Normocephalic.     Nose: Nose normal.     Comments: Cortrak in place.  Cardiovascular:     Rate and Rhythm: Normal rate.     Pulses: Normal pulses.  Pulmonary:     Effort: Pulmonary effort is normal.  Abdominal:     Palpations: Abdomen is soft.  Skin:    General: Skin is warm.  Neurological:     Mental Status: She is alert.    Palliative Assessment/Data: 10%   Existing Vynca/ACP Documentation: None  Thank you for allowing us  to participate in the care of Sondos Wolfman PMT will continue to support holistically.  I personally spent a total of 25 minutes in the care of the patient today including preparing to see the patient, performing a medically appropriate exam/evaluation, counseling and educating, referring and communicating with other health care professionals, and documenting clinical information in the EHR.   [name]  Palliative Medicine Team  Team Phone # [phone] (Nights/Weekends) 08/05/2024 4:34 PM

## 2024-08-05 NOTE — Progress Notes (Signed)
 [facility]  PROGRESS NOTE  Brenda [name] FMW:[id number] DOB: 05-06-1961 DOA: 07/15/2024 PCP: [facility]   Brief HPI:   63 year old female with history of metastatic endometrial cancer, chronic cancer-related pain, hypertension, hyperlipidemia, diabetes mellitus, depression, anxiety, CVA, history of DVT and PE anticoagulated on Lovenox , OSA, chronic hypoxic respiratory failure on 2L via nasal cannula and recent hospitalization and discharged on 07/14/2024 for UTI treated with IV antibiotics followed by oral antibiotics on discharge presented with chills and weakness.  She was found to have UTI and was started on IV cefepime .  Unfortunately mental status worsened and she was transferred to [facility] for continuous EEG monitoring with 4 seizures noted on video EEG. Started on Keppra  11/16. Placed on empiric broad-spectrum antibiotics for possible meningitis/encephalitis which was ruled out based on CSF results. As per husband, mental status worsened since antibiotics stopped. Not able to participate with any meaningful history and following only few commands.    No improvement in mentation, developed some loose stool that was attributed to tube feed formula with no concern for infection. Nothing much to offer from oncology and they advised palliative care. Family meeting with palliative and they want her to be transferred to a tertiary care center.  Discussed with her oncologist as we do not have any diagnosis which cannot be treated at current facility, transfer will not happen for a second opinion.  Per oncology, family need to arrange themself to take her to any tertiary care center of their choice , apparently has an outpatient appointment with [facility] oncology in December. Husband still hopeful the patient will improve in terms of mental status.       Assessment/Plan:   Acute metabolic encephalopathy Subclinical seizures -Mental status has improved -Likely in setting  of cefepime  causing worsening mental status; cefepime  discontinued - On 11/15, stiffening of upper extremities, patient transferred to [facility] for continuous video EEG. MRI brain on 11/15 is negative for acute findings.  Shows evolution of ischemic infarcts similar to before. Video EEG: 4 seizures noted on 11/16. Status post LP with acute meningitis/encephalitis panel negative and transient empiric antibiotics   - concern of some type of paraneoplastic with her history of advanced malignancy. - stopped IV haldol  PRN  - Will continue on keppra  500 bid - as per oncology, no further recommendations - [facility] denied patient transfer with no bed availability  - palliative following   Depression/anxiety - Prozac  40mg , and Remeron  discontinued earlier this admission due to concern for lethargy/somnolence - stopped Haldol  1mg  q6h PRN due to concern for lethargy/somnolence - started prozac  10mg  every day as per palliative recs - palliative following for symptomatic management   Acute on chronic anemia Iron deficiency - patient has history of anemia of chronic disease - Hgb 8 < 6.8, baseline 7-10 prior to this prolonged hospitalization - No signs/symptoms of acute bleeding - continue to hold therapeutic lovenox  - given 1 unit pRBC on 12/1; Hgb goal > 7 - iron studies concerning for iron deficiency - plan for IV iron for 5 day couse to end 12/5 - may need GI consult if continues to have worsening anemia despite transfusion and IV iron   Endometrial cancer with metastases Chronic cancer-related pain with opiate dependence.  - taken off fentanyl  due to somnolence - cont acetaminophen  PRN for mild pain - decreased oxycodone  5mg  q6h to 2.5mg  q3h PRN for moderate pain as per palliative recs -changed oxycodone  10mg  PRN to at bedtime scheduled as per palliative recs  -  palliative care following for symptomatic pain management. - Outpatient follow-up with oncology/Dr. Lonn.   - family requested second  opinion at [facility] but [facility] denied patient transfer with no bed availability   - Referral will be sent for outpatient   Acute RLL segmental PE Splenic infarct - acute PE diagnosed in 06/19/2024 - splenic infarct diagnosed in 05/2024 - resume Lovenox  given Hgb stable and no signs/symptoms for acute bleed - monitor CBC    History of CAD History of stroke Hyperlipidemia -Continue statin.  Not on aspirin  since she is to be on treatment dose anticoagulation - resume Lovenox  given Hgb stable and no signs/symptoms for acute bleed   Hypernatremia   Resolved with IV/PO hydration.    UTI:  Present on admission, failed p.o. antibiotics - Blood and urine cultures negative so far.  - Antibiotics were discontinued.   Hypophosphatemia Hypokalemia  -replete   Hyperthyroidism TSH is 0.23,  free T4 level elevated 1.4.  Mild suppression of TSH.  Likely from ongoing illness.   1.  Left: Patent.  Antithyroid medications are not indicated at this time.   - cont propranolol  to help with tremors.    Chronic respiratory failure with hypoxia OSA - Respiratory status stable.  - Continue supplemental oxygen .  - Continue CPAP at night   Hypoglycemia Diabetes mellitus type 2  - resolved with D50 - cont semglee  25mg  BID  - Continue with Lantus  and SSI   Obesity class I - Outpatient follow-up   Pressure injury to sacrum stage 2, not present on admission  - cont local wound care as per wound care team    Physical deconditioning - Likely will need SNF pending her clinical course.        DVT prophylaxis: Lovenox   Medications     acetaminophen   1,000 mg Per Tube TID   atorvastatin   80 mg Per Tube QHS   Chlorhexidine  Gluconate Cloth  6 each Topical Daily   enoxaparin  (LOVENOX ) injection  1 mg/kg Subcutaneous Q12H   feeding supplement (PROSource TF20)  60 mL Per Tube Daily   FLUoxetine   10 mg Per Tube Daily   free water   75 mL Per Tube Q4H   insulin  aspart  0-15 Units Subcutaneous Q4H    insulin  aspart  4 Units Subcutaneous Q4H   insulin  glargine-yfgn  25 Units Subcutaneous BID   levETIRAcetam   500 mg Per Tube BID   lidocaine   1 patch Transdermal Daily   oxyCODONE   10 mg Oral QHS   propranolol   20 mg Per Tube TID   sodium chloride  flush  10-40 mL Intracatheter Q12H     Data Reviewed:   CBG:  Recent Labs  Lab 08/04/24 2009 08/04/24 2336 08/05/24 0011 08/05/24 0342 08/05/24 0737  GLUCAP 97 66* 110* 117* 197*    SpO2: 100 % O2 Flow Rate (L/min): 2 L/min    Vitals:   08/04/24 2008 08/04/24 2335 08/05/24 0340 08/05/24 0738  BP: (!) 153/51 (!) 121/46 135/68 (!) 150/69  Pulse: 71 (!) 55 79 93  Resp: 18 16 17 18   Temp: 97.8 F (36.6 C) 98.1 F (36.7 C) 97.8 F (36.6 C) 98.4 F (36.9 C)  TempSrc: Oral Oral Oral Oral  SpO2: 100% 100% 100% 100%  Weight:      Height:          Data Reviewed:  Basic Metabolic Panel: Recent Labs  Lab 08/02/24 0427 08/03/24 0319 08/04/24 0555 08/05/24 0500  NA 132* 133* 134* 137  K 4.1 4.3  4.0 4.3  CL 96* 99 98 96*  CO2 28 28 30 31   GLUCOSE 191* 204* 122* 133*  BUN 18 18 15 15   CREATININE 0.45 0.45 0.41* 0.44  CALCIUM  8.2* 8.4* 8.3* 8.9    CBC: Recent Labs  Lab 08/02/24 0427 08/02/24 0635 08/03/24 0319 08/04/24 0555 08/05/24 0500  WBC 6.3 6.5 7.4 6.8 8.3  HGB 6.6* 7.1* 6.8* 8.0* 9.5*  HCT 20.2* 21.7* 21.2* 24.4* 29.2*  MCV 85.6 84.4 86.2 84.4 85.4  PLT 216 225 269 264 340    LFT No results for input(s): AST, ALT, ALKPHOS, BILITOT, PROT, ALBUMIN in the last 168 hours.   Antibiotics: Anti-infectives (From admission, onward)    Start     Dose/Rate Route Frequency Ordered Stop   07/23/24 0600  vancomycin  (VANCOCIN ) IVPB 1000 mg/200 mL premix  Status:  Discontinued        1,000 mg 200 mL/hr over 60 Minutes Intravenous Every 12 hours 07/22/24 1834 07/24/24 1808   07/22/24 2200  meropenem  (MERREM ) 2 g in sodium chloride  0.9 % 100 mL IVPB  Status:  Discontinued        2 g 280 mL/hr  over 30 Minutes Intravenous Every 8 hours 07/22/24 1601 07/24/24 1808   07/22/24 1700  vancomycin  (VANCOREADY) IVPB 1500 mg/300 mL        1,500 mg 150 mL/hr over 120 Minutes Intravenous  Once 07/22/24 1601 07/22/24 2031   07/22/24 1645  ampicillin  (OMNIPEN) 2 g in sodium chloride  0.9 % 100 mL IVPB  Status:  Discontinued        2 g 300 mL/hr over 20 Minutes Intravenous Every 6 hours 07/22/24 1557 07/24/24 1800   07/19/24 0530  meropenem  (MERREM ) 1 g in sodium chloride  0.9 % 100 mL IVPB  Status:  Discontinued        1 g 200 mL/hr over 30 Minutes Intravenous Every 8 hours 07/19/24 0438 07/22/24 1601   07/16/24 0900  ceFEPIme  (MAXIPIME ) 2 g in sodium chloride  0.9 % 100 mL IVPB  Status:  Discontinued        2 g 200 mL/hr over 30 Minutes Intravenous Every 8 hours 07/16/24 0811 07/19/24 0409   07/16/24 0815  ceFEPIme  (MAXIPIME ) 2 g in sodium chloride  0.9 % 100 mL IVPB  Status:  Discontinued        2 g 200 mL/hr over 30 Minutes Intravenous  Once 07/16/24 0803 07/16/24 0811   07/16/24 0330  cefTRIAXone  (ROCEPHIN ) 2 g in sodium chloride  0.9 % 100 mL IVPB        2 g 200 mL/hr over 30 Minutes Intravenous  Once 07/16/24 0315 07/16/24 0412        CONSULTS-palliative care, neurology  Code Status: DNR  Family Communication: Discussed with patient's daughters at bedside     Subjective   Somnolent but arousable.  Answering questions.  Denies any pain.   Objective    Physical Examination:   Somnolent but arousable S1-S2, regular Neuro-following commands Extremities no edema     Wound 07/25/24 1100 Pressure Injury Sacrum Medial Deep Tissue Pressure Injury - Purple or maroon localized area of discolored intact skin or blood-filled blister due to damage of underlying soft tissue from pressure and/or shear. (Active)        Sabas GORMAN Brod   [facility] If 7PM-7AM, please contact night-coverage at [email or web address], Office  [phone]   08/05/2024, 8:36 AM  LOS: 19 days

## 2024-08-06 ENCOUNTER — Inpatient Hospital Stay (HOSPITAL_COMMUNITY)

## 2024-08-06 DIAGNOSIS — G9341 Metabolic encephalopathy: Secondary | ICD-10-CM | POA: Diagnosis not present

## 2024-08-06 DIAGNOSIS — J9611 Chronic respiratory failure with hypoxia: Secondary | ICD-10-CM | POA: Diagnosis not present

## 2024-08-06 DIAGNOSIS — G893 Neoplasm related pain (acute) (chronic): Secondary | ICD-10-CM | POA: Diagnosis not present

## 2024-08-06 DIAGNOSIS — F418 Other specified anxiety disorders: Secondary | ICD-10-CM | POA: Diagnosis not present

## 2024-08-06 LAB — GLUCOSE, CAPILLARY
Glucose-Capillary: 91 mg/dL (ref 70–99)
Glucose-Capillary: 113 mg/dL — ABNORMAL HIGH (ref 70–99)
Glucose-Capillary: 176 mg/dL — ABNORMAL HIGH (ref 70–99)
Glucose-Capillary: 165 mg/dL — ABNORMAL HIGH (ref 70–99)
Glucose-Capillary: 169 mg/dL — ABNORMAL HIGH (ref 70–99)

## 2024-08-06 LAB — TYPE AND SCREEN
ABO/RH(D): O POS
Antibody Screen: NEGATIVE
Unit division: 0
Unit division: 0
Unit division: 0

## 2024-08-06 LAB — BPAM RBC
Blood Product Expiration Date: 202512072359
Blood Product Expiration Date: 202512162359
Blood Product Expiration Date: 202512292359
ISSUE DATE / TIME: 202512010429
ISSUE DATE / TIME: 202512012136
Unit Type and Rh: 5100
Unit Type and Rh: 5100
Unit Type and Rh: 5100

## 2024-08-06 MED ORDER — FENTANYL CITRATE (PF) 50 MCG/ML IJ SOSY
12.5000 ug | PREFILLED_SYRINGE | INTRAMUSCULAR | Status: DC | PRN
  Administered 2024-08-08 – 2024-08-09 (×4): 12.5 ug via INTRAVENOUS
  Filled 2024-08-06 (×4): qty 1

## 2024-08-06 MED ORDER — SODIUM CHLORIDE 0.9 % IV SOLN
200.0000 mg | INTRAVENOUS | Status: AC
  Administered 2024-08-06 – 2024-08-07 (×2): 200 mg via INTRAVENOUS
  Filled 2024-08-06 (×2): qty 10

## 2024-08-06 NOTE — Progress Notes (Signed)
 Modified Barium Swallow Study  Patient Details  Name: Brenda [name] MRN: [id number] Date of Birth: [birth year]  Today's Date: 08/06/2024  Modified Barium Swallow completed.  Full report located under Chart Review in the Imaging Section.  History of Present Illness Brenda [name] is a 63 y.o. year old presented to [facility] with chills and weakness, work up for UTI, finding of PE, transferred to [facility]. BSE 11/14 recommended Dys 3/thin. Over weekend pt had significant decline in status, alertness. MRI 11/15 revealed no acute infarcts, interval evolution of previously identified embolic infarcts. Pt lethargic with no improvements over 3 sessions and discharged requesting re-eval if  improvement. MD/family requested re eval of swallow as some sedation meds have been d/c'd and showing fluctuating periods of alertness. PMH:  metastatic endometrial cancer, and chronic cancer-related pain, CVA, hypertension, hyperlipidemia, diabetes mellitus, depression, anxiety, DVT and PE , OSA, chronic hypoxic respiratory failure.   Clinical Impression Limited consistencies provided of thin and puree due to decreased endurance and deconditioning. Decreased labial closure, tongue control and propulsion led to anterior leakage and mild residue (floor of mouth, tongue base, right sulci) that spilled to pyriform sinuses with decreased  sensation and delayed reflexive swallow that clears. Laryngeal mobility is adequate with trace penetration of thin due to decreased timing of laryngeal closure that is ejected with spontaneous seond swallow. Intermittent delayed swallow onset filling pyriform sinuses with one episode of mild aspiration with larger straw sip thin. This was followed by weak reflexive ineffective throat clear and facilitated by susequent smaller straw sips. Epiglottis inconsistently partially inverted with mild reduced tongue base retraction resulted in consistent tongue base and mild vallecular residue with puree  reduced to trace with delayed reflexive swallow. Mild esophageal residue present. Recommend puree texture, thin liquids, crush pills or via Cortrak. Straw sips offer increased control, decreased anterior spill with feeding assistant ensuring small sips. Allow pt time for second swallow intermittently and check/remove oral residue. Discussed with daughter and MD that aspiration events could occur given her fragile medical condition. ST will continue to follow. Factors that may increase risk of adverse event in presence of aspiration Noe & Lianne 2021): Poor general health and/or compromised immunity;Reduced cognitive function;Limited mobility;Frail or deconditioned;Dependence for feeding and/or oral hygiene;Weak cough  Swallow Evaluation Recommendations Recommendations: PO diet PO Diet Recommendation: Dysphagia 1 (Pureed);Thin liquids (Level 0) Liquid Administration via: Cup;Spoon;Straw Medication Administration: Crushed with puree (or via tube) Supervision: Full assist for feeding;Full supervision/cueing for swallowing strategies Swallowing strategies  : Slow rate;Small bites/sips;Multiple dry swallows after each bite/sip;Check for pocketing or oral holding Postural changes: Position pt fully upright for meals Oral care recommendations: Oral care BID (2x/day)      Dustin Olam Bull 08/06/2024,4:07 PM

## 2024-08-06 NOTE — Progress Notes (Signed)
 [facility]  PROGRESS NOTE  Brenda [name] FMW:[id number] DOB: April 23, 1961 DOA: 07/15/2024 PCP: [facility]   Brief HPI:   63 year old female with history of metastatic endometrial cancer, chronic cancer-related pain, hypertension, hyperlipidemia, diabetes mellitus, depression, anxiety, CVA, history of DVT and PE anticoagulated on Lovenox , OSA, chronic hypoxic respiratory failure on 2L via nasal cannula and recent hospitalization and discharged on 07/14/2024 for UTI treated with IV antibiotics followed by oral antibiotics on discharge presented with chills and weakness.  She was found to have UTI and was started on IV cefepime .  Unfortunately mental status worsened and she was transferred to [facility] for continuous EEG monitoring with 4 seizures noted on video EEG. Started on Keppra  11/16. Placed on empiric broad-spectrum antibiotics for possible meningitis/encephalitis which was ruled out based on CSF results. As per husband, mental status worsened since antibiotics stopped. Not able to participate with any meaningful history and following only few commands.    No improvement in mentation, developed some loose stool that was attributed to tube feed formula with no concern for infection. Nothing much to offer from oncology and they advised palliative care. Family meeting with palliative and they want her to be transferred to a tertiary care center.  Discussed with her oncologist as we do not have any diagnosis which cannot be treated at current facility, transfer will not happen for a second opinion.  Per oncology, family need to arrange themself to take her to any tertiary care center of their choice , apparently has an outpatient appointment with [facility] oncology in December. Husband still hopeful the patient will improve in terms of mental status.       Assessment/Plan:   Acute metabolic encephalopathy Subclinical seizures -Mental status has improved -Likely in setting  of cefepime  causing worsening mental status; cefepime  discontinued - On 11/15, stiffening of upper extremities, patient transferred to [facility] for continuous video EEG. MRI brain on 11/15 is negative for acute findings.  Shows evolution of ischemic infarcts similar to before. Video EEG: 4 seizures noted on 11/16. Status post LP with acute meningitis/encephalitis panel negative and transient empiric antibiotics   - concern of some type of paraneoplastic with her history of advanced malignancy. - stopped IV haldol  PRN  - Will continue on keppra  500 bid - as per oncology, no further recommendations - [facility] denied patient transfer with no bed availability  - palliative following  Dysphagia -Swallow evaluation obtained, underwent MBS -Started on dysphagia 1 diet   Depression/anxiety - Prozac  40mg , and Remeron  discontinued earlier this admission due to concern for lethargy/somnolence - stopped Haldol  1mg  q6h PRN due to concern for lethargy/somnolence - started prozac  10mg  every day as per palliative recs - palliative following for symptomatic management   Acute on chronic anemia Iron  deficiency - patient has history of anemia of chronic disease - Hgb 8 < 6.8, baseline 7-10 prior to this prolonged hospitalization - No signs/symptoms of acute bleeding - continue to hold therapeutic lovenox  - given 1 unit pRBC on 12/1; Hgb goal > 7 - iron  studies concerning for iron  deficiency - plan for IV iron  for 5 day couse to end 12/5 - may need GI consult if continues to have worsening anemia despite transfusion and IV iron    Endometrial cancer with metastases Chronic cancer-related pain with opiate dependence.  - taken off fentanyl  due to somnolence - cont acetaminophen  PRN for mild pain - decreased oxycodone  5mg  q6h to 2.5mg  q3h PRN for moderate pain as per palliative recs -changed oxycodone   10mg  PRN to at bedtime scheduled as per palliative recs  - palliative care following for symptomatic pain  management. - Outpatient follow-up with oncology/Dr. Lonn.   - family requested second opinion at [facility] but [facility] denied patient transfer with no bed availability   - Referral will be sent for outpatient   Acute RLL segmental PE Splenic infarct - acute PE diagnosed in 06/19/2024 - splenic infarct diagnosed in 05/2024 - resume Lovenox  given Hgb stable and no signs/symptoms for acute bleed - monitor CBC    History of CAD History of stroke Hyperlipidemia -Continue statin.  Not on aspirin  since she is to be on treatment dose anticoagulation - resume Lovenox  given Hgb stable and no signs/symptoms for acute bleed   Hypernatremia   Resolved with IV/PO hydration.    UTI:  Present on admission, failed p.o. antibiotics - Blood and urine cultures negative so far.  - Antibiotics were discontinued.   Hypophosphatemia Hypokalemia  -replete   Hyperthyroidism TSH is 0.23,  free T4 level elevated 1.4.  Mild suppression of TSH.  Likely from ongoing illness.   1.  Left: Patent.  Antithyroid medications are not indicated at this time.   - cont propranolol  to help with tremors.    Chronic respiratory failure with hypoxia OSA - Respiratory status stable.  - Continue supplemental oxygen .  - Continue CPAP at night   Hypoglycemia Diabetes mellitus type 2  - resolved with D50 - cont semglee  25mg  BID  - Continue with Lantus  and SSI   Obesity class I - Outpatient follow-up   Pressure injury to sacrum stage 2, not present on admission  - cont local wound care as per wound care team    Physical deconditioning - Likely will need SNF pending her clinical course.        DVT prophylaxis: Lovenox   Medications     acetaminophen   1,000 mg Per Tube TID   atorvastatin   80 mg Per Tube QHS   Chlorhexidine  Gluconate Cloth  6 each Topical Daily   enoxaparin  (LOVENOX ) injection  1 mg/kg Subcutaneous Q12H   feeding supplement (PROSource TF20)  60 mL Per Tube Daily   FLUoxetine   10 mg Per  Tube Daily   free water   75 mL Per Tube Q4H   insulin  aspart  0-15 Units Subcutaneous Q4H   insulin  aspart  4 Units Subcutaneous Q4H   insulin  glargine-yfgn  25 Units Subcutaneous BID   levETIRAcetam   500 mg Per Tube BID   lidocaine   1 patch Transdermal Daily   oxyCODONE   10 mg Oral QHS   propranolol   20 mg Per Tube TID   sodium chloride  flush  10-40 mL Intracatheter Q12H     Data Reviewed:   CBG:  Recent Labs  Lab 08/05/24 1526 08/05/24 1917 08/05/24 2311 08/06/24 0314 08/06/24 0821  GLUCAP 214* 184* 142* 91 113*    SpO2: 100 % O2 Flow Rate (L/min): 2 L/min    Vitals:   08/05/24 1955 08/06/24 0012 08/06/24 0417 08/06/24 0724  BP: (!) 151/40 (!) 170/37 (!) 155/38 (!) 157/46  Pulse: 73 72 86 81  Resp:    16  Temp: 98.8 F (37.1 C) 98 F (36.7 C) 97.7 F (36.5 C) 98.3 F (36.8 C)  TempSrc: Oral  Oral Oral  SpO2: 100% 100% 100% 100%  Weight:      Height:          Data Reviewed:  Basic Metabolic Panel: Recent Labs  Lab 08/02/24 0427 08/03/24 0319 08/04/24 0555  08/05/24 0500  NA 132* 133* 134* 137  K 4.1 4.3 4.0 4.3  CL 96* 99 98 96*  CO2 28 28 30 31   GLUCOSE 191* 204* 122* 133*  BUN 18 18 15 15   CREATININE 0.45 0.45 0.41* 0.44  CALCIUM  8.2* 8.4* 8.3* 8.9    CBC: Recent Labs  Lab 08/02/24 0427 08/02/24 0635 08/03/24 0319 08/04/24 0555 08/05/24 0500  WBC 6.3 6.5 7.4 6.8 8.3  HGB 6.6* 7.1* 6.8* 8.0* 9.5*  HCT 20.2* 21.7* 21.2* 24.4* 29.2*  MCV 85.6 84.4 86.2 84.4 85.4  PLT 216 225 269 264 340    LFT No results for input(s): AST, ALT, ALKPHOS, BILITOT, PROT, ALBUMIN in the last 168 hours.   Antibiotics: Anti-infectives (From admission, onward)    Start     Dose/Rate Route Frequency Ordered Stop   07/23/24 0600  vancomycin  (VANCOCIN ) IVPB 1000 mg/200 mL premix  Status:  Discontinued        1,000 mg 200 mL/hr over 60 Minutes Intravenous Every 12 hours 07/22/24 1834 07/24/24 1808   07/22/24 2200  meropenem  (MERREM ) 2 g in  sodium chloride  0.9 % 100 mL IVPB  Status:  Discontinued        2 g 280 mL/hr over 30 Minutes Intravenous Every 8 hours 07/22/24 1601 07/24/24 1808   07/22/24 1700  vancomycin  (VANCOREADY) IVPB 1500 mg/300 mL        1,500 mg 150 mL/hr over 120 Minutes Intravenous  Once 07/22/24 1601 07/22/24 2031   07/22/24 1645  ampicillin  (OMNIPEN) 2 g in sodium chloride  0.9 % 100 mL IVPB  Status:  Discontinued        2 g 300 mL/hr over 20 Minutes Intravenous Every 6 hours 07/22/24 1557 07/24/24 1800   07/19/24 0530  meropenem  (MERREM ) 1 g in sodium chloride  0.9 % 100 mL IVPB  Status:  Discontinued        1 g 200 mL/hr over 30 Minutes Intravenous Every 8 hours 07/19/24 0438 07/22/24 1601   07/16/24 0900  ceFEPIme  (MAXIPIME ) 2 g in sodium chloride  0.9 % 100 mL IVPB  Status:  Discontinued        2 g 200 mL/hr over 30 Minutes Intravenous Every 8 hours 07/16/24 0811 07/19/24 0409   07/16/24 0815  ceFEPIme  (MAXIPIME ) 2 g in sodium chloride  0.9 % 100 mL IVPB  Status:  Discontinued        2 g 200 mL/hr over 30 Minutes Intravenous  Once 07/16/24 0803 07/16/24 0811   07/16/24 0330  cefTRIAXone  (ROCEPHIN ) 2 g in sodium chloride  0.9 % 100 mL IVPB        2 g 200 mL/hr over 30 Minutes Intravenous  Once 07/16/24 0315 07/16/24 0412        CONSULTS-palliative care, neurology  Code Status: DNR  Family Communication: Discussed with patient's daughters at bedside     Subjective   Denies any complaints.   Objective    Physical Examination:  Appears somnolent but opens eyes to verbal stimuli S1-S2, regular, no murmur auscultated Lungs are clear to auscultation bilaterally Abdomen is soft, nontender      Wound 07/25/24 1100 Pressure Injury Sacrum Medial Deep Tissue Pressure Injury - Purple or maroon localized area of discolored intact skin or blood-filled blister due to damage of underlying soft tissue from pressure and/or shear. (Active)        Brenda [name]   [facility] If  7PM-7AM, please contact night-coverage at [email or web address], Office  [phone]   08/06/2024, 8:41  AM  LOS: 20 days

## 2024-08-06 NOTE — Progress Notes (Signed)
 Daily Progress Note   Date: 08/06/2024   Patient Name: Brenda [name]  DOB: [birth year]  MRN: [id number]  Age / Sex: 63 y.o., female  Attending Physician: Brenda Sabas RAMAN, [name] Primary Care Physician: Brenda [facility] Admit Date: 07/15/2024 Length of Stay: 20 days  Reason for Follow-up: Establishing goals of care  Past Medical History:  Diagnosis Date   Anemia    Arthritis    back, hands, hips (02/22/2015)   CAD (coronary artery disease)    a. complex LAD/diagonal bifurcation PCI in 2010. b. STEMI 10/2019 s/p PTCA/DES x1 to mLAD overlapping the old stent, residual disease treated medicaly.   Cancer [facility])    uterine   CHF (congestive heart failure) (HCC)    Depression    Diabetes mellitus (HCC)    started when I was pregnant; not sure if it was type 1 or type 2    History of radiation therapy    Endometrium- HDR 01/22/22-03/07/22- Dr. Lynwood [name]   Hypercholesterolemia    Hypertension    Morbid obesity (HCC)    Myocardial infarction (HCC)    mild x 3   Neuromuscular disorder (HCC)    neuropathy feet   Sleep apnea    cpap/    Assessment & Plan:   HPI/Patient Profile:  63 y.o. 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   Palliative medicine consulted for goals of care conversation.   SUMMARY OF RECOMMENDATIONS DNR-limited Continue efforts to improve mentation and treat reversible causes at this time MBS showed aspiration with large volume bolus but advanced to pureed diet, family wants to assess PO intake prior discussing PEG tube PMT will continue to follow and support Preemptive medication with turns Symptom management as below, added Fentanyl  12.5 mcg for incidental pain as below   Symptom Management:  Tylenol  1000 mg TID for pain Fluoxetine  10 mg daily for  depression Oxycodone  10 mg nightly for pain Oxycodone  2.5 mg PRN Q3 for breakthrough pain Fentanyl  12.5 mcg PRN Q4 for turns/incidental pain  Code Status: DNR - Limited (DNR/DNI)  Prognosis: Unable to determine  Discharge Planning: To Be Determined  Discussed with: Brenda [name] about discussion on PEG tube and fentanyl  administration for quicker onset analgesia for turns/incidental pain.   Subjective:   Subjective: Chart Reviewed. Updates received. Patient Assessed. Created space and opportunity for patient  and family to explore thoughts and feelings regarding current medical situation.  In the last 24 hours patient has received scheduled oxycodone  10 mg x1 and PRN oxycodone  2.5 mg x 1. Has not received any oxycodone  on 08/06/2024 prior to visit.   Today's Discussion:  Per discussion with SLP Brenda [name] on 08/06/2024 about MBS, patient demonstrated aspiration with larger sips, did well with smaller sips and plan to advance diet to pureed but to be attentive when eating with HOB elevated as patient remains at risk for aspiration.   Met with patient and husband at bedside. After greeting patient she responded presumably to husband at the bedside lets get out and get lunch. Patient remained somnolent but alert to voice, but less responsive compared to visit yesterday.   Husband discussed incidental pain management with quicker onset other than the current PRN oxycodone  for turns or after working with PT/OT. Discussed starting low dose fentanyl  as patient and family are familiar with it for breakthrough pain after discussing with primary attending.  Discussed PEG tube with husband and he shared that he would like to see how much the patient eats in the coming days before making a decision on a permanent feeding tube. Family is aware that a decision needs to be made in the next week as the patient has had a cortrak for 15 days now and aware that hospital policy is about 2 weeks.   Review of Systems   Unable to perform ROS  Objective:   Primary Diagnoses: Present on Admission:  UTI (urinary tract infection)  Chronic hypoxic respiratory failure (HCC)  Anxiety with depression  CAD (coronary artery disease)  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  DNR (do not resuscitate)  Endometrial carcinoma (HCC)  Obstructive sleep apnea  Acute metabolic encephalopathy  Vital Signs:  BP (!) 157/46 (BP Location: Right Leg)   Pulse 81   Temp 98.3 F (36.8 C) (Oral)   Resp 16   Ht 5' 2 (1.575 m)   Wt 81.2 kg   LMP 10/30/2014 (Approximate)   SpO2 100%   BMI 32.74 kg/m   Physical Exam Constitutional:      Comments: Somnolent but alert to voice.   HENT:     Head: Normocephalic.     Nose: Nose normal.  Cardiovascular:     Rate and Rhythm: Normal rate.     Pulses: Normal pulses.  Pulmonary:     Effort: Pulmonary effort is normal.  Abdominal:     Palpations: Abdomen is soft.  Musculoskeletal:     Right lower leg: No edema.     Left lower leg: No edema.  Skin:    Comments: Cold BLE with DP+    Palliative Assessment/Data: 10%   Existing Vynca/ACP Documentation: None  Thank you for allowing us  to participate in the care of Brenda [name] PMT will continue to support holistically.  I personally spent a total of 50 minutes in the care of the patient today including preparing to see the patient, getting/reviewing separately obtained history, performing a medically appropriate exam/evaluation, counseling and educating, referring and communicating with other health care professionals, and documenting clinical information in the EHR.  Brenda [name]  Palliative Medicine Team  Team Phone # [phone] (Nights/Weekends) 08/06/2024 9:26 AM

## 2024-08-06 NOTE — Plan of Care (Signed)
 Spouse at bedside. Modified barium swallow test today. Diet ordered.    Problem: Education: Goal: Ability to describe self-care measures that may prevent or decrease complications (Diabetes Survival Skills Education) will improve Outcome: Progressing   Problem: Skin Integrity: Goal: Risk for impaired skin integrity will decrease Outcome: Progressing   Problem: Safety: Goal: Ability to remain free from injury will improve Outcome: Progressing

## 2024-08-06 NOTE — Plan of Care (Signed)
  Problem: Coping: Goal: Ability to adjust to condition or change in health will improve Outcome: Progressing   Problem: Fluid Volume: Goal: Ability to maintain a balanced intake and output will improve Outcome: Progressing   Problem: Metabolic: Goal: Ability to maintain appropriate glucose levels will improve Outcome: Progressing   Problem: Nutritional: Goal: Maintenance of adequate nutrition will improve Outcome: Progressing Goal: Progress toward achieving an optimal weight will improve Outcome: Progressing   Problem: Skin Integrity: Goal: Risk for impaired skin integrity will decrease Outcome: Progressing   Problem: Elimination: Goal: Will not experience complications related to bowel motility Outcome: Progressing Goal: Will not experience complications related to urinary retention Outcome: Progressing   Problem: Pain Managment: Goal: General experience of comfort will improve and/or be controlled Outcome: Progressing   Problem: Safety: Goal: Ability to remain free from injury will improve Outcome: Progressing   Problem: Skin Integrity: Goal: Risk for impaired skin integrity will decrease Outcome: Progressing

## 2024-08-07 DIAGNOSIS — G893 Neoplasm related pain (acute) (chronic): Secondary | ICD-10-CM | POA: Diagnosis not present

## 2024-08-07 DIAGNOSIS — J9611 Chronic respiratory failure with hypoxia: Secondary | ICD-10-CM | POA: Diagnosis not present

## 2024-08-07 DIAGNOSIS — G9341 Metabolic encephalopathy: Secondary | ICD-10-CM | POA: Diagnosis not present

## 2024-08-07 DIAGNOSIS — F418 Other specified anxiety disorders: Secondary | ICD-10-CM | POA: Diagnosis not present

## 2024-08-07 LAB — BASIC METABOLIC PANEL WITH GFR
Anion gap: 9 (ref 5–15)
BUN: 18 mg/dL (ref 8–23)
CO2: 29 mmol/L (ref 22–32)
Calcium: 8.7 mg/dL — ABNORMAL LOW (ref 8.9–10.3)
Chloride: 100 mmol/L (ref 98–111)
Creatinine, Ser: 0.35 mg/dL — ABNORMAL LOW (ref 0.44–1.00)
GFR, Estimated: >60 mL/min (ref >60)
Glucose, Bld: 96 mg/dL (ref 70–99)
Potassium: 3.8 mmol/L (ref 3.5–5.1)
Sodium: 138 mmol/L (ref 135–145)

## 2024-08-07 LAB — CBC
HCT: 29.9 % — ABNORMAL LOW (ref 36.0–46.0)
Hemoglobin: 9.6 g/dL — ABNORMAL LOW (ref 12.0–15.0)
MCH: 27.7 pg (ref 26.0–34.0)
MCHC: 32.1 g/dL (ref 30.0–36.0)
MCV: 86.4 fL (ref 80.0–100.0)
Platelets: 352 K/uL (ref 150–400)
RBC: 3.46 MIL/uL — ABNORMAL LOW (ref 3.87–5.11)
RDW: 17.6 % — ABNORMAL HIGH (ref 11.5–15.5)
WBC: 10 K/uL (ref 4.0–10.5)
nRBC: 0 % (ref 0.0–0.2)

## 2024-08-07 LAB — GLUCOSE, CAPILLARY
Glucose-Capillary: 162 mg/dL — ABNORMAL HIGH (ref 70–99)
Glucose-Capillary: 139 mg/dL — ABNORMAL HIGH (ref 70–99)
Glucose-Capillary: 94 mg/dL (ref 70–99)
Glucose-Capillary: 123 mg/dL — ABNORMAL HIGH (ref 70–99)
Glucose-Capillary: 120 mg/dL — ABNORMAL HIGH (ref 70–99)
Glucose-Capillary: 136 mg/dL — ABNORMAL HIGH (ref 70–99)

## 2024-08-07 MED ORDER — ENSURE PLUS HIGH PROTEIN PO LIQD
237.0000 mL | ORAL | Status: DC
  Administered 2024-08-07 – 2024-08-17 (×7): 237 mL via ORAL

## 2024-08-07 NOTE — TOC Progression Note (Signed)
 Transition of Care [facility]) - Progression Note    Patient Details  Name: Brenda [name] MRN: [id number] Date of Birth: 05-03-1961  Transition of Care [facility]) CM/SW Contact  [name], [place] Phone Number: 08/07/2024, 1:36 PM  Clinical Narrative:   CSW following for disposition. Medical workup ongoing, palliative following for goals of care. Not medically stable at this time. CSW to follow.    Expected Discharge Plan:  (TBD) Barriers to Discharge: Continued Medical Work up               Expected Discharge Plan and Services                                               Social Drivers of Health (SDOH) Interventions SDOH Screenings   Food Insecurity: No Food Insecurity (07/16/2024)  Recent Concern: Food Insecurity - Food Insecurity Present (04/20/2024)  Housing: Low Risk  (07/16/2024)  Transportation Needs: No Transportation Needs (07/16/2024)  Utilities: Not At Risk (07/16/2024)  Depression (PHQ2-9): Low Risk  (06/30/2024)  Recent Concern: Depression (PHQ2-9) - Medium Risk (05/07/2024)  Financial Resource Strain: High Risk (02/10/2021)  Social Connections: Moderately Integrated (07/13/2024)  Tobacco Use: Medium Risk (07/16/2024)    Readmission Risk Interventions    07/14/2024    2:34 PM 05/04/2024   10:00 AM  Readmission Risk Prevention Plan  Transportation Screening Complete Complete  PCP or Specialist Appt within 3-5 Days  Complete  HRI or Home Care Consult  Complete  Social Work Consult for Recovery Care Planning/Counseling  Complete  Palliative Care Screening  Not Applicable  Medication Review [employer]) Complete Complete  PCP or Specialist appointment within 3-5 days of discharge Complete   HRI or Home Care Consult Complete   SW Recovery Care/Counseling Consult Complete   Palliative Care Screening Not Applicable   Skilled Nursing Facility Not Applicable

## 2024-08-07 NOTE — Plan of Care (Signed)
   Problem: Coping: Goal: Ability to adjust to condition or change in health will improve Outcome: Progressing   Problem: Fluid Volume: Goal: Ability to maintain a balanced intake and output will improve Outcome: Progressing

## 2024-08-07 NOTE — Progress Notes (Signed)
 Nutrition Follow-up  DOCUMENTATION CODES:  Non-severe (moderate) malnutrition in context of chronic illness  INTERVENTION:  Continue the following tube feeding via cortrak: Jevity 1.5 at 45 ml/h (1080 ml per day) Prosource TF20 60 ml 1x/d Free water : 200mL every 4 hours per MD Provides 1700 kcal, 88 gm protein, 821 ml free water  daily ( fee water  TF+flush) Continue current diet as ordered per SLP, encourage PO intake Monitor GOC discussions Ensure Plus High Protein po 1x/d, each supplement provides 350 kcal and 20 grams of protein.   NUTRITION DIAGNOSIS:  Moderate Malnutrition related to chronic illness (cancer) as evidenced by percent weight loss, mild muscle depletion, moderate muscle depletion (12% x 6 months). - remains applicable  GOAL:  Patient will meet greater than or equal to 90% of their needs - being addressed with tube feeds  MONITOR:  Weight trends, TF tolerance, I & O's, Labs, Diet advancement  REASON FOR ASSESSMENT:  Consult Enteral/tube feeding initiation and management  ASSESSMENT:  Pt with hx of HTN, HLD, CAD, CHF, hx CVA, uterine cancer s/p hysterectomy and adjuvant radiation therapy in 2023 with recurrance with mets in July 2025, hx MI x 3, and DM type 1 presented to Brenda [name] with weakness after a recent admission from 11/9-11/11. Pt had seizures during admission and was transferred to Brenda [name] for continuous EEG.  11/13 - presented to Brenda [name] [name] with chills and weakness after recent admission to Brenda [name] 11/15 - transferred to Brenda [name] 11/19 - cortrak placed (gastric) 12/2 - SLP BSE, NPO 12/4 - MBS, DYS1, thin liquids  Pt resting in bed at the time of assessment, awake and conversant today. Visitor at bedside. Pt reports that she hasn'Brenda had anything to eat today, lunch tray at bedside observed to be untouched. Pt reports she just doesn'Brenda feel like eating anything. Reports that she has also not had anything to drink.   Discussed with  pt and visitor that she is now on a diet and that PO intake should be attempted to at least practice swallowing. Encouraged her to ask for items that may be tolerable like ice cream or pudding. Advised there were always some puree items available at the nurses station. Pt reports those items sound good and she will reach out.   At this time, pt not taking in enough to warrant a kcal count or reduction of TF as she is very weak. Will monitor GOC discussions and intake to determine a nutrition plan that best meets needs.   Of note, pt has had cortrak in place for >2 weeks. Although there is no time limit for use of tube per the manufacturer, it is reasonable to begin to consider a transition to PEG tube after 2-3 weeks if [name] term nutrition support is anticipated as this wound provide a higher level of comfort for patient.    Admit weight: 77.9 kg Current weight: 81.2 kg   12% weight loss noted in the last 6 months (since cancer recurrence) which is severe for timeframe  Nutritionally Relevant Medications: Scheduled Meds:  atorvastatin   80 mg Per Tube QHS   PROSource TF20  60 mL Per Tube Daily   free water   75 mL Per Tube Q4H   insulin  aspart  0-15 Units Subcutaneous Q4H   insulin  aspart  4 Units Subcutaneous Q4H   insulin  glargine-yfgn  25 Units Subcutaneous BID   levETIRAcetam   500 mg Per Tube BID   Continuous Infusions:  feeding supplement (JEVITY 1.5 CAL/FIBER) 45 mL/hr  at 08/05/24 1647   PRN Meds: loperamide , ondansetron , prochlorperazine , senna-docusate  Labs Reviewed: CBG ranges from 94-176 mg/dL over the last 24 hours HgbA1c 7.2% (06/01/24)  NUTRITION - FOCUSED PHYSICAL EXAM: Flowsheet Row Most Recent Value  Orbital Region No depletion  Upper Arm Region No depletion  Thoracic and Lumbar Region No depletion  Buccal Region No depletion  Temple Region Mild depletion  Clavicle Bone Region Mild depletion  Clavicle and Acromion Bone Region Mild depletion  Scapular Bone Region  Mild depletion  Dorsal Hand No depletion  Patellar Region Moderate depletion  Anterior Thigh Region Moderate depletion  Posterior Calf Region Moderate depletion  Edema (RD Assessment) Moderate  [BLE, BUE]  Hair Reviewed  Eyes Reviewed  Mouth Reviewed  Skin Reviewed  Nails Reviewed    Diet Order:   Diet Order             DIET - DYS 1 Room service appropriate? Yes with Assist; Fluid consistency: Thin  Diet effective now                   EDUCATION NEEDS:  Education needs have been addressed  Skin:  Skin Assessment: Reviewed RN Assessment DTI: - Sacrum (0.3x0.3x0.1 cm)  Last BM:  12/3 - type 6  Height:  Ht Readings from Last 1 Encounters:  07/16/24 5' 2 (1.575 m)    Weight:  Wt Readings from Last 1 Encounters:  08/04/24 81.2 kg    Ideal Body Weight:  50 kg  BMI:  Body mass index is 32.74 kg/m.  Estimated Nutritional Needs:  Kcal:  1600-1800 kcal/d Protein:  75-90 g/d Fluid:  >/=1.6L/d    Brenda [name], RD, LDN, CNSC Registered Dietitian II Please reach out via secure chat

## 2024-08-07 NOTE — Progress Notes (Signed)
 Speech Language Pathology Treatment: Dysphagia  Patient Details Name: Brenda [name] MRN: [id number] DOB: 03/09/1961 Today's Date: 08/07/2024 Time: 8888-8872 SLP Time Calculation (min) (ACUTE ONLY): 16 min  Assessment / Plan / Recommendation Clinical Impression  Pt seen after MBS with family friends at bedside. She was sleeping on arrival after pain meds this morning but able to wake, verbalize she wanted diet [place]. Unable to express from straw today likely due to drowsiness but accepted tsp sips with right side leakage and delayed swallow initiation noted as present during MBS. There were no s/s aspiration with limited trials. Did not attempt pudding due to suspected increased pharyngeal residue with her current state of alertness and weakness. Did not appear she had eaten breakfast when tray observed. Intake will likely be little with questionable improvement. Noted discussions with Palliative care re: PEG. ST will continue to follow.    HPI HPI: Brenda [name] is a 63 y.o. year old presented to [facility] with chills and weakness, work up for UTI, finding of PE, transferred to [facility]. BSE 11/14 recommended Dys 3/thin. Over weekend pt had significant decline in status, alertness. MRI 11/15 revealed no acute infarcts, interval evolution of previously identified embolic infarcts. Pt lethargic with no improvements over 3 sessions and discharged requesting re-eval if  improvement. MD/family requested re eval of swallow as some sedation meds have been d/c'd and showing fluctuating periods of alertness. PMH:  metastatic endometrial cancer, and chronic cancer-related pain, CVA, hypertension, hyperlipidemia, diabetes mellitus, depression, anxiety, DVT and PE , OSA, chronic hypoxic respiratory failure.      SLP Plan  Continue with current plan of care        Swallow Evaluation Recommendations   Recommendations: PO diet PO Diet Recommendation: Thin liquids (Level 0);Dysphagia 1 (Pureed) Liquid  Administration via: Straw;Cup;Spoon Medication Administration: Crushed with puree (or Cortrak) Supervision: Full assist for feeding Postural changes: Position pt fully upright for meals Oral care recommendations: Oral care BID (2x/day)     Recommendations                     Oral care BID   Frequent or constant Supervision/Assistance Dysphagia, oropharyngeal phase (R13.12)     Continue with current plan of care     Dustin Olam Bull  08/07/2024, 11:36 AM

## 2024-08-07 NOTE — Progress Notes (Signed)
 [facility]  PROGRESS NOTE  Brenda [name] FMW:[id number] DOB: September 20, 1960 DOA: 07/15/2024 PCP: [facility]   Brief HPI:   62 year old female with history of metastatic endometrial cancer, chronic cancer-related pain, hypertension, hyperlipidemia, diabetes mellitus, depression, anxiety, CVA, history of DVT and PE anticoagulated on Lovenox , OSA, chronic hypoxic respiratory failure on 2L via nasal cannula and recent hospitalization and discharged on 07/14/2024 for UTI treated with IV antibiotics followed by oral antibiotics on discharge presented with chills and weakness.  She was found to have UTI and was started on IV cefepime .  Unfortunately mental status worsened and she was transferred to [facility] for continuous EEG monitoring with 4 seizures noted on video EEG. Started on Keppra  11/16. Placed on empiric broad-spectrum antibiotics for possible meningitis/encephalitis which was ruled out based on CSF results. As per husband, mental status worsened since antibiotics stopped. Not able to participate with any meaningful history and following only few commands.    No improvement in mentation, developed some loose stool that was attributed to tube feed formula with no concern for infection. Nothing much to offer from oncology and they advised palliative care. Family meeting with palliative and they want her to be transferred to a tertiary care center.  Discussed with her oncologist as we do not have any diagnosis which cannot be treated at current facility, transfer will not happen for a second opinion.  Per oncology, family need to arrange themself to take her to any tertiary care center of their choice , apparently has an outpatient appointment with [facility] oncology in December. Husband still hopeful the patient will improve in terms of mental status.       Assessment/Plan:   Acute metabolic encephalopathy Subclinical seizures -Mental status has improved -Likely in setting  of cefepime  causing worsening mental status; cefepime  discontinued - On 11/15, stiffening of upper extremities, patient transferred to [facility] for continuous video EEG. MRI brain on 11/15 is negative for acute findings.  Shows evolution of ischemic infarcts similar to before. Video EEG: 4 seizures noted on 11/16. Status post LP with acute meningitis/encephalitis panel negative and transient empiric antibiotics   - concern of some type of paraneoplastic with her history of advanced malignancy. - stopped IV haldol  PRN  - Will continue on keppra  500 bid - as per oncology, no further recommendations - [facility] denied patient transfer with no bed availability  - palliative following  Dysphagia -Swallow evaluation obtained, underwent MBS -Started on dysphagia 1 diet   Depression/anxiety - Prozac  40mg , and Remeron  discontinued earlier this admission due to concern for lethargy/somnolence - stopped Haldol  1mg  q6h PRN due to concern for lethargy/somnolence - started prozac  10mg  every day as per palliative recs - palliative following for symptomatic management   Acute on chronic anemia Iron  deficiency - patient has history of anemia of chronic disease - Hgb 8 < 6.8, baseline 7-10 prior to this prolonged hospitalization - No signs/symptoms of acute bleeding - continue to hold therapeutic lovenox  - given 1 unit pRBC on 12/1; Hgb goal > 7 - iron  studies concerning for iron  deficiency - plan for IV iron  for 5 day couse to end 12/5 - may need GI consult if continues to have worsening anemia despite transfusion and IV iron    Endometrial cancer with metastases Chronic cancer-related pain with opiate dependence.  - taken off fentanyl  due to somnolence - cont acetaminophen  PRN for mild pain - decreased oxycodone  5mg  q6h to 2.5mg  q3h PRN for moderate pain as per palliative recs -changed oxycodone   10mg  PRN to at bedtime scheduled as per palliative recs  - palliative care following for symptomatic pain  management. - Outpatient follow-up with oncology/Dr. Lonn.   - family requested second opinion at [facility] but [facility] denied patient transfer with no bed availability   - Referral will be sent for outpatient   Acute RLL segmental PE Splenic infarct - acute PE diagnosed in 06/19/2024 - splenic infarct diagnosed in 05/2024 - resume Lovenox  given Hgb stable and no signs/symptoms for acute bleed - monitor CBC    History of CAD History of stroke Hyperlipidemia -Continue statin.  Not on aspirin  since she is to be on treatment dose anticoagulation - resume Lovenox  given Hgb stable and no signs/symptoms for acute bleed   Hypernatremia   Resolved with IV/PO hydration.    UTI:  Present on admission, failed p.o. antibiotics - Blood and urine cultures negative so far.  - Antibiotics were discontinued.   Hypophosphatemia Hypokalemia  -replete   Hyperthyroidism TSH is 0.23,  free T4 level elevated 1.4.  Mild suppression of TSH.  Likely from ongoing illness.   1.  Left: Patent.  Antithyroid medications are not indicated at this time.   - cont propranolol  to help with tremors.    Chronic respiratory failure with hypoxia OSA - Respiratory status stable.  - Continue supplemental oxygen .  - Continue CPAP at night   Hypoglycemia Diabetes mellitus type 2  - resolved with D50 - cont semglee  25mg  BID  - Continue with Lantus  and SSI   Obesity class I - Outpatient follow-up   Pressure injury to sacrum stage 2, not present on admission  - cont local wound care as per wound care team    Physical deconditioning - Likely will need SNF pending her clinical course.        DVT prophylaxis: Lovenox   Medications     acetaminophen   1,000 mg Per Tube TID   atorvastatin   80 mg Per Tube QHS   Chlorhexidine  Gluconate Cloth  6 each Topical Daily   enoxaparin  (LOVENOX ) injection  1 mg/kg Subcutaneous Q12H   feeding supplement (PROSource TF20)  60 mL Per Tube Daily   FLUoxetine   10 mg Per  Tube Daily   free water   75 mL Per Tube Q4H   insulin  aspart  0-15 Units Subcutaneous Q4H   insulin  aspart  4 Units Subcutaneous Q4H   insulin  glargine-yfgn  25 Units Subcutaneous BID   levETIRAcetam   500 mg Per Tube BID   lidocaine   1 patch Transdermal Daily   oxyCODONE   10 mg Oral QHS   propranolol   20 mg Per Tube TID   sodium chloride  flush  10-40 mL Intracatheter Q12H     Data Reviewed:   CBG:  Recent Labs  Lab 08/06/24 0821 08/06/24 1213 08/06/24 1526 08/06/24 2322 08/07/24 0338  GLUCAP 113* 176* 165* 169* 139*    SpO2: 100 % O2 Flow Rate (L/min): 2 L/min    Vitals:   08/06/24 1637 08/06/24 2000 08/06/24 2320 08/07/24 0336  BP: 124/64 134/70 (!) 133/47 (!) 140/63  Pulse: 64 71 68 91  Resp:  18 18 18   Temp:  97.9 F (36.6 C) 97.9 F (36.6 C) 98.6 F (37 C)  TempSrc:  Oral  Oral  SpO2:  99% 100% 100%  Weight:      Height:          Data Reviewed:  Basic Metabolic Panel: Recent Labs  Lab 08/02/24 0427 08/03/24 0319 08/04/24 0555 08/05/24 0500 08/07/24 0600  NA 132* 133* 134* 137 138  K 4.1 4.3 4.0 4.3 3.8  CL 96* 99 98 96* 100  CO2 28 28 30 31 29   GLUCOSE 191* 204* 122* 133* 96  BUN 18 18 15 15 18   CREATININE 0.45 0.45 0.41* 0.44 0.35*  CALCIUM  8.2* 8.4* 8.3* 8.9 8.7*    CBC: Recent Labs  Lab 08/02/24 0635 08/03/24 0319 08/04/24 0555 08/05/24 0500 08/07/24 0600  WBC 6.5 7.4 6.8 8.3 10.0  HGB 7.1* 6.8* 8.0* 9.5* 9.6*  HCT 21.7* 21.2* 24.4* 29.2* 29.9*  MCV 84.4 86.2 84.4 85.4 86.4  PLT 225 269 264 340 352    LFT No results for input(s): AST, ALT, ALKPHOS, BILITOT, PROT, ALBUMIN in the last 168 hours.   Antibiotics: Anti-infectives (From admission, onward)    Start     Dose/Rate Route Frequency Ordered Stop   07/23/24 0600  vancomycin  (VANCOCIN ) IVPB 1000 mg/200 mL premix  Status:  Discontinued        1,000 mg 200 mL/hr over 60 Minutes Intravenous Every 12 hours 07/22/24 1834 07/24/24 1808   07/22/24 2200   meropenem  (MERREM ) 2 g in sodium chloride  0.9 % 100 mL IVPB  Status:  Discontinued        2 g 280 mL/hr over 30 Minutes Intravenous Every 8 hours 07/22/24 1601 07/24/24 1808   07/22/24 1700  vancomycin  (VANCOREADY) IVPB 1500 mg/300 mL        1,500 mg 150 mL/hr over 120 Minutes Intravenous  Once 07/22/24 1601 07/22/24 2031   07/22/24 1645  ampicillin  (OMNIPEN) 2 g in sodium chloride  0.9 % 100 mL IVPB  Status:  Discontinued        2 g 300 mL/hr over 20 Minutes Intravenous Every 6 hours 07/22/24 1557 07/24/24 1800   07/19/24 0530  meropenem  (MERREM ) 1 g in sodium chloride  0.9 % 100 mL IVPB  Status:  Discontinued        1 g 200 mL/hr over 30 Minutes Intravenous Every 8 hours 07/19/24 0438 07/22/24 1601   07/16/24 0900  ceFEPIme  (MAXIPIME ) 2 g in sodium chloride  0.9 % 100 mL IVPB  Status:  Discontinued        2 g 200 mL/hr over 30 Minutes Intravenous Every 8 hours 07/16/24 0811 07/19/24 0409   07/16/24 0815  ceFEPIme  (MAXIPIME ) 2 g in sodium chloride  0.9 % 100 mL IVPB  Status:  Discontinued        2 g 200 mL/hr over 30 Minutes Intravenous  Once 07/16/24 0803 07/16/24 0811   07/16/24 0330  cefTRIAXone  (ROCEPHIN ) 2 g in sodium chloride  0.9 % 100 mL IVPB        2 g 200 mL/hr over 30 Minutes Intravenous  Once 07/16/24 0315 07/16/24 0412        CONSULTS-palliative care, neurology  Code Status: DNR  Family Communication: Discussed with patient's daughters at bedside     Subjective    Patient seen and examined.  No new complaints.  Objective    Physical Examination:   Appears somnolent but arousable S1-S2, regular, no murmur auscultated Lungs clear to auscultation bilaterally Abdominal soft, nontender, no organomegaly    Wound 07/25/24 1100 Pressure Injury Sacrum Medial Deep Tissue Pressure Injury - Purple or maroon localized area of discolored intact skin or blood-filled blister due to damage of underlying soft tissue from pressure and/or shear. (Active)        Sabas GORMAN Brod   [facility] If 7PM-7AM, please contact night-coverage at [email or web address], Office  [phone]   08/07/2024, 7:49 AM  LOS: 21 days

## 2024-08-08 DIAGNOSIS — G9341 Metabolic encephalopathy: Secondary | ICD-10-CM | POA: Diagnosis not present

## 2024-08-08 DIAGNOSIS — Z66 Do not resuscitate: Secondary | ICD-10-CM | POA: Diagnosis not present

## 2024-08-08 DIAGNOSIS — R531 Weakness: Secondary | ICD-10-CM

## 2024-08-08 DIAGNOSIS — E440 Moderate protein-calorie malnutrition: Secondary | ICD-10-CM | POA: Diagnosis not present

## 2024-08-08 LAB — GLUCOSE, CAPILLARY
Glucose-Capillary: 119 mg/dL — ABNORMAL HIGH (ref 70–99)
Glucose-Capillary: 146 mg/dL — ABNORMAL HIGH (ref 70–99)
Glucose-Capillary: 153 mg/dL — ABNORMAL HIGH (ref 70–99)
Glucose-Capillary: 176 mg/dL — ABNORMAL HIGH (ref 70–99)
Glucose-Capillary: 99 mg/dL (ref 70–99)
Glucose-Capillary: 99 mg/dL (ref 70–99)

## 2024-08-08 NOTE — Plan of Care (Signed)
  Problem: Fluid Volume: Goal: Ability to maintain a balanced intake and output will improve Outcome: Progressing   Problem: Metabolic: Goal: Ability to maintain appropriate glucose levels will improve Outcome: Progressing   Problem: Nutritional: Goal: Maintenance of adequate nutrition will improve Outcome: Progressing   Problem: Health Behavior/Discharge Planning: Goal: Ability to manage health-related needs will improve Outcome: Not Progressing

## 2024-08-08 NOTE — Progress Notes (Addendum)
 [facility]  PROGRESS NOTE  Brenda [name] FMW:[id number] DOB: [birth year] DOA: 07/15/2024 PCP: [facility]   Brief HPI:   63 year old female with history of metastatic endometrial cancer, chronic cancer-related pain, hypertension, hyperlipidemia, diabetes mellitus, depression, anxiety, CVA, history of DVT and PE anticoagulated on Lovenox , OSA, chronic hypoxic respiratory failure on 2L via nasal cannula and recent hospitalization and discharged on 07/14/2024 for UTI treated with IV antibiotics followed by oral antibiotics on discharge presented with chills and weakness.  She was found to have UTI and was started on IV cefepime .  Unfortunately mental status worsened and she was transferred to [facility] for continuous EEG monitoring with 4 seizures noted on video EEG. Started on Keppra  11/16. Placed on empiric broad-spectrum antibiotics for possible meningitis/encephalitis which was ruled out based on CSF results. As per husband, mental status worsened since antibiotics stopped. Not able to participate with any meaningful history and following only few commands.    No improvement in mentation, developed some loose stool that was attributed to tube feed formula with no concern for infection. Nothing much to offer from oncology and they advised palliative care. Family meeting with palliative and they want her to be transferred to a tertiary care center.  Discussed with her oncologist as we do not have any diagnosis which cannot be treated at current facility, transfer will not happen for a second opinion.  Per oncology, family need to arrange themself to take her to any tertiary care center of their choice , apparently has an outpatient appointment with [facility] oncology in December. Husband still hopeful the patient will improve in terms of mental status.       Assessment/Plan:   Acute metabolic encephalopathy Subclinical seizures -Mental status has improved -Likely in setting  of cefepime  causing worsening mental status; cefepime  discontinued - On 11/15, stiffening of upper extremities, patient transferred to [facility] for continuous video EEG. MRI brain on 11/15 is negative for acute findings.  Shows evolution of ischemic infarcts similar to before. Video EEG: 4 seizures noted on 11/16. Status post LP with acute meningitis/encephalitis panel negative and transient empiric antibiotics   - concern of some type of paraneoplastic with her history of advanced malignancy. - stopped IV haldol  PRN  - Will continue on keppra  500 bid - as per oncology, no further recommendations - [facility] denied patient transfer with no bed availability  - palliative following   Dysphagia -Swallow evaluation obtained, underwent MBS -Started on dysphagia 1 diet   Depression/anxiety - Prozac  40mg , and Remeron  discontinued earlier this admission due to concern for lethargy/somnolence - stopped Haldol  1mg  q6h PRN due to concern for lethargy/somnolence - started prozac  10mg  every day as per palliative recs - palliative following for symptomatic management   Acute on chronic anemia Iron  deficiency - patient has history of anemia of chronic disease - Hgb 8 < 6.8, baseline 7-10 prior to this prolonged hospitalization - No signs/symptoms of acute bleeding - continue to hold therapeutic lovenox  - given 1 unit pRBC on 12/1; Hgb goal > 7 - iron  studies concerning for iron  deficiency - plan for IV iron  for 5 day couse to end 12/5 - may need GI consult if continues to have worsening anemia despite transfusion and IV iron    Endometrial cancer with metastases Chronic cancer-related pain with opiate dependence.  - taken off fentanyl  due to somnolence - cont acetaminophen  PRN for mild pain - decreased oxycodone  5mg  q6h to 2.5mg  q3h PRN for moderate pain as per palliative recs -changed  oxycodone  10mg  PRN to at bedtime scheduled as per palliative recs  - palliative care following for symptomatic pain  management. - Outpatient follow-up with oncology/Dr. Lonn.   - family requested second opinion at [facility] but [facility] denied patient transfer with no bed availability   - Referral will be sent for outpatient -I discussed in detail with patient's husband at bedside, he was upset that oncology has not given him any further input.  Called and discussed with oncologist on-call Dr. Arvella Hof, he will see patient in a.m. and will discuss with patient's husband any other further options for patient's treatment.   Acute RLL segmental PE Splenic infarct - acute PE diagnosed in 06/19/2024 - splenic infarct diagnosed in 05/2024 - resume Lovenox  given Hgb stable and no signs/symptoms for acute bleed - monitor CBC    History of CAD History of stroke Hyperlipidemia -Continue statin.  Not on aspirin  since she is to be on treatment dose anticoagulation - resume Lovenox  given Hgb stable and no signs/symptoms for acute bleed   Hypernatremia   Resolved with IV/PO hydration.    UTI:  Present on admission, failed p.o. antibiotics - Blood and urine cultures negative so far.  - Antibiotics were discontinued.   Hypophosphatemia Hypokalemia  -replete   Hyperthyroidism TSH is 0.23,  free T4 level elevated 1.4.  Mild suppression of TSH.  Likely from ongoing illness.   1.  Left: Patent.  Antithyroid medications are not indicated at this time.   - cont propranolol  to help with tremors.    Chronic respiratory failure with hypoxia OSA - Respiratory status stable.  - Continue supplemental oxygen .  - Continue CPAP at night   Hypoglycemia Diabetes mellitus type 2  - resolved with D50 - cont semglee  25mg  BID  - Continue with Lantus  and SSI   Obesity class I - Outpatient follow-up   Pressure injury to sacrum stage 2, not present on admission  - cont local wound care as per wound care team    Physical deconditioning - Likely will need SNF pending her clinical course.        DVT prophylaxis:  Lovenox   Medications     acetaminophen   1,000 mg Per Tube TID   atorvastatin   80 mg Per Tube QHS   Chlorhexidine  Gluconate Cloth  6 each Topical Daily   enoxaparin  (LOVENOX ) injection  1 mg/kg Subcutaneous Q12H   feeding supplement  237 mL Oral Q24H   feeding supplement (PROSource TF20)  60 mL Per Tube Daily   FLUoxetine   10 mg Per Tube Daily   free water   75 mL Per Tube Q4H   insulin  aspart  0-15 Units Subcutaneous Q4H   insulin  aspart  4 Units Subcutaneous Q4H   insulin  glargine-yfgn  25 Units Subcutaneous BID   levETIRAcetam   500 mg Per Tube BID   lidocaine   1 patch Transdermal Daily   oxyCODONE   10 mg Oral QHS   propranolol   20 mg Per Tube TID   sodium chloride  flush  10-40 mL Intracatheter Q12H     Data Reviewed:   CBG:  Recent Labs  Lab 08/07/24 1141 08/07/24 1541 08/07/24 2016 08/08/24 0024 08/08/24 0310  GLUCAP 123* 120* 136* 99 99    SpO2: 100 % O2 Flow Rate (L/min): 2 L/min    Vitals:   08/07/24 2021 08/08/24 0000 08/08/24 0300 08/08/24 0500  BP: (!) 145/63 (!) 144/74 (!) 157/71   Pulse: 69 64 82   Resp: 17 18    Temp: 98.7 F (  37.1 C) 98.6 F (37 C) 98.4 F (36.9 C)   TempSrc: Axillary Axillary Axillary   SpO2: 100% 100% 100%   Weight:    81.6 kg  Height:          Data Reviewed:  Basic Metabolic Panel: Recent Labs  Lab 08/02/24 0427 08/03/24 0319 08/04/24 0555 08/05/24 0500 08/07/24 0600  NA 132* 133* 134* 137 138  K 4.1 4.3 4.0 4.3 3.8  CL 96* 99 98 96* 100  CO2 28 28 30 31 29   GLUCOSE 191* 204* 122* 133* 96  BUN 18 18 15 15 18   CREATININE 0.45 0.45 0.41* 0.44 0.35*  CALCIUM  8.2* 8.4* 8.3* 8.9 8.7*    CBC: Recent Labs  Lab 08/02/24 0635 08/03/24 0319 08/04/24 0555 08/05/24 0500 08/07/24 0600  WBC 6.5 7.4 6.8 8.3 10.0  HGB 7.1* 6.8* 8.0* 9.5* 9.6*  HCT 21.7* 21.2* 24.4* 29.2* 29.9*  MCV 84.4 86.2 84.4 85.4 86.4  PLT 225 269 264 340 352    LFT No results for input(s): AST, ALT, ALKPHOS, BILITOT, PROT,  ALBUMIN in the last 168 hours.   Antibiotics: Anti-infectives (From admission, onward)    Start     Dose/Rate Route Frequency Ordered Stop   07/23/24 0600  vancomycin  (VANCOCIN ) IVPB 1000 mg/200 mL premix  Status:  Discontinued        1,000 mg 200 mL/hr over 60 Minutes Intravenous Every 12 hours 07/22/24 1834 07/24/24 1808   07/22/24 2200  meropenem  (MERREM ) 2 g in sodium chloride  0.9 % 100 mL IVPB  Status:  Discontinued        2 g 280 mL/hr over 30 Minutes Intravenous Every 8 hours 07/22/24 1601 07/24/24 1808   07/22/24 1700  vancomycin  (VANCOREADY) IVPB 1500 mg/300 mL        1,500 mg 150 mL/hr over 120 Minutes Intravenous  Once 07/22/24 1601 07/22/24 2031   07/22/24 1645  ampicillin  (OMNIPEN) 2 g in sodium chloride  0.9 % 100 mL IVPB  Status:  Discontinued        2 g 300 mL/hr over 20 Minutes Intravenous Every 6 hours 07/22/24 1557 07/24/24 1800   07/19/24 0530  meropenem  (MERREM ) 1 g in sodium chloride  0.9 % 100 mL IVPB  Status:  Discontinued        1 g 200 mL/hr over 30 Minutes Intravenous Every 8 hours 07/19/24 0438 07/22/24 1601   07/16/24 0900  ceFEPIme  (MAXIPIME ) 2 g in sodium chloride  0.9 % 100 mL IVPB  Status:  Discontinued        2 g 200 mL/hr over 30 Minutes Intravenous Every 8 hours 07/16/24 0811 07/19/24 0409   07/16/24 0815  ceFEPIme  (MAXIPIME ) 2 g in sodium chloride  0.9 % 100 mL IVPB  Status:  Discontinued        2 g 200 mL/hr over 30 Minutes Intravenous  Once 07/16/24 0803 07/16/24 0811   07/16/24 0330  cefTRIAXone  (ROCEPHIN ) 2 g in sodium chloride  0.9 % 100 mL IVPB        2 g 200 mL/hr over 30 Minutes Intravenous  Once 07/16/24 0315 07/16/24 0412        CONSULTS-palliative care, neurology  Code Status: DNR  Family Communication: Discussed with patient's husband at bedside     Subjective   Denies any complaints.   Objective    Physical Examination:  Appears in no acute distress S1-S2, regular Lungs clear to auscultation bilaterally Abdomen is  soft, nontender, no organomegaly    Wound 07/25/24 1100 Pressure  Injury Sacrum Medial Deep Tissue Pressure Injury - Purple or maroon localized area of discolored intact skin or blood-filled blister due to damage of underlying soft tissue from pressure and/or shear. (Active)        Sabas GORMAN Brod   [facility] If 7PM-7AM, please contact night-coverage at [email or web address], Office  [phone]   08/08/2024, 7:57 AM  LOS: 22 days

## 2024-08-09 ENCOUNTER — Inpatient Hospital Stay (HOSPITAL_COMMUNITY)

## 2024-08-09 DIAGNOSIS — G9341 Metabolic encephalopathy: Secondary | ICD-10-CM | POA: Diagnosis not present

## 2024-08-09 LAB — GLUCOSE, CAPILLARY
Glucose-Capillary: 101 mg/dL — ABNORMAL HIGH (ref 70–99)
Glucose-Capillary: 105 mg/dL — ABNORMAL HIGH (ref 70–99)
Glucose-Capillary: 138 mg/dL — ABNORMAL HIGH (ref 70–99)
Glucose-Capillary: 166 mg/dL — ABNORMAL HIGH (ref 70–99)
Glucose-Capillary: 174 mg/dL — ABNORMAL HIGH (ref 70–99)
Glucose-Capillary: 77 mg/dL (ref 70–99)
Glucose-Capillary: 83 mg/dL (ref 70–99)

## 2024-08-09 LAB — CBC
HCT: 30.8 % — ABNORMAL LOW (ref 36.0–46.0)
Hemoglobin: 10.1 g/dL — ABNORMAL LOW (ref 12.0–15.0)
MCH: 28.6 pg (ref 26.0–34.0)
MCHC: 32.8 g/dL (ref 30.0–36.0)
MCV: 87.3 fL (ref 80.0–100.0)
Platelets: 324 K/uL (ref 150–400)
RBC: 3.53 MIL/uL — ABNORMAL LOW (ref 3.87–5.11)
RDW: 18.1 % — ABNORMAL HIGH (ref 11.5–15.5)
WBC: 11.4 K/uL — ABNORMAL HIGH (ref 4.0–10.5)
nRBC: 0 % (ref 0.0–0.2)

## 2024-08-09 LAB — COMPREHENSIVE METABOLIC PANEL WITH GFR
ALT: 168 U/L — ABNORMAL HIGH (ref 0–44)
AST: 150 U/L — ABNORMAL HIGH (ref 15–41)
Albumin: 2.4 g/dL — ABNORMAL LOW (ref 3.5–5.0)
Alkaline Phosphatase: 140 U/L — ABNORMAL HIGH (ref 38–126)
Anion gap: 6 (ref 5–15)
BUN: 15 mg/dL (ref 8–23)
CO2: 29 mmol/L (ref 22–32)
Calcium: 8.5 mg/dL — ABNORMAL LOW (ref 8.9–10.3)
Chloride: 101 mmol/L (ref 98–111)
Creatinine, Ser: 0.49 mg/dL (ref 0.44–1.00)
GFR, Estimated: >60 mL/min (ref >60)
Glucose, Bld: 86 mg/dL (ref 70–99)
Potassium: 4 mmol/L (ref 3.5–5.1)
Sodium: 136 mmol/L (ref 135–145)
Total Bilirubin: 0.7 mg/dL (ref 0.0–1.2)
Total Protein: 6.2 g/dL — ABNORMAL LOW (ref 6.5–8.1)

## 2024-08-09 LAB — T4, FREE: Free T4: 0.99 ng/dL (ref 0.61–1.12)

## 2024-08-09 LAB — TSH: TSH: 1.556 u[IU]/mL (ref 0.350–4.500)

## 2024-08-09 MED ORDER — GADOBUTROL 1 MMOL/ML IV SOLN
8.0000 mL | Freq: Once | INTRAVENOUS | Status: AC | PRN
  Administered 2024-08-09: 8 mL via INTRAVENOUS

## 2024-08-09 MED ORDER — MORPHINE SULFATE (PF) 2 MG/ML IV SOLN
2.0000 mg | INTRAVENOUS | Status: DC | PRN
  Administered 2024-08-09 – 2024-08-18 (×8): 2 mg via INTRAVENOUS
  Filled 2024-08-09 (×9): qty 1

## 2024-08-09 MED ORDER — MORPHINE SULFATE (PF) 2 MG/ML IV SOLN
4.0000 mg | INTRAVENOUS | Status: DC | PRN
  Administered 2024-08-09 – 2024-08-19 (×7): 4 mg via INTRAVENOUS
  Filled 2024-08-09 (×7): qty 2

## 2024-08-09 MED ORDER — MORPHINE SULFATE (PF) 2 MG/ML IV SOLN: 2.0000 mg | INTRAVENOUS | Status: DC | PRN

## 2024-08-09 MED ORDER — IOHEXOL 350 MG/ML SOLN
75.0000 mL | Freq: Once | INTRAVENOUS | Status: AC | PRN
  Administered 2024-08-09: 75 mL via INTRAVENOUS

## 2024-08-09 MED ORDER — INSULIN GLARGINE 100 UNIT/ML ~~LOC~~ SOLN
20.0000 [IU] | Freq: Two times a day (BID) | SUBCUTANEOUS | Status: DC
  Administered 2024-08-09 – 2024-08-10 (×3): 20 [IU] via SUBCUTANEOUS
  Filled 2024-08-09 (×5): qty 0.2

## 2024-08-09 NOTE — Progress Notes (Signed)
 NEUROLOGY CONSULT FOLLOW UP NOTE   Date of service: August 09, 2024 Patient Name: Brenda [name] MRN:  [id number] DOB:  Dec 02, 1960  Interval Hx/subjective   Reconsulted for persistent encephalopathy and now some rigidity in all extremities.  Husband at the bedside reports that overall, he feels like she has declined.  He feels that she had a period of uptick while she was on antibiotics a couple weeks ago.  Also reports that she was rigid on the right and then on the left. This has been going on for more than a week.  Vitals   Vitals:   08/09/24 0111 08/09/24 0500 08/09/24 0828 08/09/24 1201  BP: (!) 159/74 (!) 171/93 (!) 163/74 (!) 153/72  Pulse: 71 83 93 72  Resp:   18 18  Temp: 98.2 F (36.8 C) 98.2 F (36.8 C) 97.8 F (36.6 C) 97.7 F (36.5 C)  TempSrc: Axillary Axillary Oral Oral  SpO2: 100% 100% 100% 100%  Weight:  81.6 kg    Height:         Body mass index is 32.92 kg/m.  Physical Exam   Constitutional: Chronically ill female Eyes: No scleral injection.   HENT: No OP obstrucion.   Head: Normocephalic.   Cardiovascular: Normal rate and regular rhythm.   Respiratory: Effort normal, non-labored breathing.   GI: Soft.  No distension. There is no tenderness.   Skin: WDI.    Neurologic Examination   Mental Status -  Patient is asleep but opens eyes to voice. Oriented to self. Will follow commands intermittently after a lot of coaching/encouragement.  Cranial Nerves II - XII - II - Pupils with sluggish reaction to light BL. Does seem to turn head towards face. III, IV, VI -doll's eyes intact, eyes are midline intact corneal reflex bilaterally V - Facial sensation intact bilaterally. VII - Facial movement intact bilaterally. VIII - Hearing & vestibular intact bilaterally. X - Palate elevates symmetrically. XI - Chin turning & shoulder shrug intact bilaterally. XII -unable to assess  Motor Strength - She will squeeze fingers to hand grip BL. Motor  Tone - increased tone with rigidity noted with passive range of motion in all directions.  Sensory -response to noxious stimuli and baniski reflex BL Coordination -unable to assess  Reflexes: 2+ throughout.  Gait and Station - deferred for pt safety.  Medications  Current Facility-Administered Medications:    acetaminophen  (TYLENOL ) tablet 1,000 mg, 1,000 mg, Per Tube, TID, Cooper, Josseline P, PA-C, 1,000 mg at 08/09/24 0909   artificial tears (LACRILUBE) ophthalmic ointment, , Both Eyes, Q4H PRN, Cosette Blackwater, MD, 1 Application at 08/03/24 2021   atorvastatin  (LIPITOR ) tablet 80 mg, 80 mg, Per Tube, QHS, Sigdel, Santosh, MD, 80 mg at 08/08/24 2125   Chlorhexidine  Gluconate Cloth 2 % PADS 6 each, 6 each, Topical, Daily, Chavez, Abigail, NP, 6 each at 08/09/24 0919   enoxaparin  (LOVENOX ) injection 80 mg, 1 mg/kg, Subcutaneous, Q12H, Tariq, Hassan, MD, 80 mg at 08/09/24 0920   feeding supplement (ENSURE PLUS HIGH PROTEIN) liquid 237 mL, 237 mL, Oral, Q24H, Lama, Gagan S, MD, 237 mL at 08/08/24 1722   feeding supplement (JEVITY 1.5 CAL/FIBER) liquid 1,000 mL, 1,000 mL, Per Tube, Continuous, Cosette Blackwater, MD, Last Rate: 45 mL/hr at 08/05/24 1647, Infusion Verify at 08/05/24 1647   feeding supplement (PROSource TF20) liquid 60 mL, 60 mL, Per Tube, Daily, Sigdel, Santosh, MD, 60 mL at 08/09/24 0920   fentaNYL  (SUBLIMAZE ) injection 12.5 mcg, 12.5 mcg, Intravenous, Q4H PRN, Ko, Joseph  E, PA-C, 12.5 mcg at 08/09/24 9370   FLUoxetine  (PROZAC ) capsule 10 mg, 10 mg, Per Tube, Daily, Cooper, Josseline P, PA-C, 10 mg at 08/09/24 0919   free water  75 mL, 75 mL, Per Tube, Q4H, Cosette Blackwater, MD, 75 mL at 08/09/24 1202   insulin  aspart (novoLOG ) injection 0-15 Units, 0-15 Units, Subcutaneous, Q4H, Sigdel, Santosh, MD, 3 Units at 08/08/24 2121   insulin  aspart (novoLOG ) injection 4 Units, 4 Units, Subcutaneous, Q4H, Sigdel, Santosh, MD, 4 Units at 08/09/24 0836   insulin  glargine (LANTUS ) injection 20  Units, 20 Units, Subcutaneous, BID, Perri DELENA Meliton Mickey., MD   levETIRAcetam  (KEPPRA ) tablet 500 mg, 500 mg, Per Tube, BID, Laurence Jinnie BIRCH, RPH, 500 mg at 08/09/24 0919   lidocaine  (LIDODERM ) 5 % 1 patch, 1 patch, Transdermal, Daily, Alekh, Kshitiz, MD, 1 patch at 08/09/24 9076   loperamide  HCl (IMODIUM ) 1 MG/7.5ML suspension 2 mg, 2 mg, Per Tube, PRN, Merilee Linsey I, RPH, 2 mg at 08/05/24 9178   morphine  (PF) 2 MG/ML injection 2 mg, 2 mg, Intravenous, Q2H PRN, 2 mg at 08/09/24 1300 **OR** morphine  (PF) 2 MG/ML injection 4 mg, 4 mg, Intravenous, Q2H PRN, Perri DELENA Meliton Mickey., MD   naloxone  (NARCAN ) injection 0.4 mg, 0.4 mg, Intravenous, PRN, Alfornia Madison, MD   ondansetron  (ZOFRAN ) tablet 4 mg, 4 mg, Per Tube, Q6H PRN **OR** ondansetron  (ZOFRAN ) injection 4 mg, 4 mg, Intravenous, Q6H PRN, Cosette Blackwater, MD   oxyCODONE  (Oxy IR/ROXICODONE ) immediate release tablet 10 mg, 10 mg, Oral, QHS, Cooper, Josseline P, PA-C, 10 mg at 08/08/24 2341   oxyCODONE  (Oxy IR/ROXICODONE ) immediate release tablet 2.5 mg, 2.5 mg, Per Tube, Q3H PRN, Cooper, Josseline P, PA-C, 2.5 mg at 08/09/24 1420   prochlorperazine  (COMPAZINE ) tablet 10 mg, 10 mg, Oral, Q6H PRN, Foust, Katy L, NP   propranolol  (INDERAL ) tablet 20 mg, 20 mg, Per Tube, TID, Sigdel, Santosh, MD, 20 mg at 08/09/24 0919   senna-docusate (Senokot-S) tablet 1 tablet, 1 tablet, Oral, QHS PRN, Foust, Katy L, NP   sodium chloride  flush (NS) 0.9 % injection 10-40 mL, 10-40 mL, Intracatheter, Q12H, Sigdel, Santosh, MD, 10 mL at 08/09/24 0923   sodium chloride  flush (NS) 0.9 % injection 10-40 mL, 10-40 mL, Intracatheter, PRN, Mcarthur Pick, MD  Labs and Diagnostic Imaging   CBC:  Recent Labs  Lab 08/07/24 0600 08/09/24 1221  WBC 10.0 11.4*  HGB 9.6* 10.1*  HCT 29.9* 30.8*  MCV 86.4 87.3  PLT 352 324    Basic Metabolic Panel:  Lab Results  Component Value Date   NA 136 08/09/2024   K 4.0 08/09/2024   CO2 29 08/09/2024   GLUCOSE 86  08/09/2024   BUN 15 08/09/2024   CREATININE 0.49 08/09/2024   CALCIUM  8.5 (L) 08/09/2024   GFRNONAA >60 08/09/2024   GFRAA 94 12/16/2019   Lipid Panel:  Lab Results  Component Value Date   LDLCALC 28 05/30/2024   HgbA1c:  Lab Results  Component Value Date   HGBA1C 7.2 (H) 06/01/2024   Urine Drug Screen:     Component Value Date/Time   LABOPIA POSITIVE (A) 04/20/2024 1817   COCAINSCRNUR NONE DETECTED 04/20/2024 1817   LABBENZ NONE DETECTED 04/20/2024 1817   AMPHETMU NONE DETECTED 04/20/2024 1817   THCU NONE DETECTED 04/20/2024 1817   LABBARB NONE DETECTED 04/20/2024 1817    Alcohol Level     Component Value Date/Time   ETH <15 04/20/2024 1344   INR  Lab Results  Component Value Date  INR 1.0 07/16/2024   APTT  Lab Results  Component Value Date   APTT 67 (H) 06/21/2024   AED levels: No results found for: PHENYTOIN, ZONISAMIDE, LAMOTRIGINE, LEVETIRACETA  CT Head without contrast 1. Chronic encephalomalacia changes in the right medial temporal and occipital lobes developed in the interim. 2. Unchanged small calcified meningioma along left frontal convexity.   MRI Brain 1. Similar evolving infarcts in the anterior and posterior circulation. 2. No new infarcts, progressive mass effect or acute hemorrhage.   LTM EEG 11/16-11/17:   ABNORMALITY - Seizures, generalized - Continuous slow, generalized   IMPRESSION: This study showed seizures with generalized onset during which patient was noted to bilateral upper extremity jerking.  4 seizures were noted on 07/19/2024 at 0820, 1305, 1600 and 1922 lasting between 30 minutes to 1.5 hours.     Additionally EEG showed generalized periodic discharges with triphasic morphology with fluctuating frequency of 2 to 3 Hz.  This EEG pattern is on the ictal-interictal continuum with high suspicion for ictal nature.   Lastly there is generalized cerebral dysfunction (encephalopathy).  Given the history, semiology of  seizures and the EEG pattern, this is concerning for cefepime  neuro toxicity.   LTM EEG 11/17-11/18: ABNORMALITY - Periodic discharges with triphasic morphology, generalized - Continuous slow, generalized   IMPRESSION: This study showed generalized periodic discharges with triphasic morphology with fluctuating frequency of 2 to 2.5 Hz. This EEG pattern is on the ictal-interictal continuum with high suspicion for ictal nature.   Additionally there is generalized cerebral dysfunction (encephalopathy).     LTM EEG 11/18-11/19: ABNORMALITY - Periodic discharges with triphasic morphology, generalized - Continuous slow, generalized   IMPRESSION: This study showed generalized periodic discharges ( GPDs) with triphasic morphology. At the beginning of the study, the frequency of GPDs was 2-2.5Hz  which gradually improved to 1-2hz . This EEG pattern is on the ictal-interictal continuum.    Additionally there is generalized cerebral dysfunction (encephalopathy).      LP with CSF studies(07/24/24): WBC count of 8, meningitis/encephalitis panel is negative, CSF cultures with gram stain negative.  Assessment   Ophia Shamoon is a 62 y.o. 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 She subsequently continued to decline, becoming increasingly encephalopathic. She was subsequently transferred to [facility] for cEEG.  Initial LTM EEG recording showed 4 electrographic seizures on 07/19/24 with generalized onset lasting 30-90 min and associated with BUE jerking. Additionally EEG showed generalized periodic discharges with triphasic morphology with fluctuating frequency of 2 to 3 Hz.  This EEG pattern is on the ictal-interictal continuum with high suspicion for ictal nature.  Clinical and electrographic seizures were favored to be driven  primarily by cefepime  toxicity however given her prior (recent) stroke, known meningioma, and recurrent UTIs her risk of recurrent seizure was felt to be high enough to justify continuation of AED at hospital discharge (and indefinitely). Electrographic seizures captured 11/16 cannot be attributed to injury from prior strokes or cortical irritation from known meningioma because onset was generalized, not focal. Of note, husband is concerned that she actually had a seizure prior to admission bc a similar shaking episode is what caused him to call 911.   She hs been seizure-free on keppra  and off cefepime . Her EEG background improved but mental status showed mild improvement. Given profound continued encephalopathy (not only 2/2 prn  fentanyl ) she underwent LP to rule out concurrent meningitis.  Neurology team signed off after negative extensive workup. However, over the last week, noted to be more rigid and with continued encephalopathy, neurology was reconsulted to see if there is any further evaluation this warrants.  Recommendations   - Continue keppra  500mg  bid - MRI brain and C spine with and without contrast - Will continue to follow  Plan discussed with Dr. Perri with the Hospitalist team. Plan also discussed with patient's husband at the bedside.  Bryker Fletchall [facility]

## 2024-08-09 NOTE — Progress Notes (Signed)
 IP PROGRESS NOTE  Subjective:   Brenda [name] was diagnosed with uterine cancer in 2023 and completed treatment with surgery and adjuvant radiation.  She had recurrent disease involving a retroperitoneal mass in July 2025.  She was treated with 2 cycles of paclitaxel /carboplatin  and pembrolizumab , last given 06/30/2024. Her husband was at the bedside when I saw her at approximately 7:15 AM. She has been admitted multiple times over the past few months.  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  She is maintained on apixaban  anticoagulation for the strokes, a DVT, and a pulmonary embolism. She was readmitted 07/13/2024 with urinary tract infection.  She has multiple comorbid conditions including diabetes, chronic respiratory failure, history of coronary artery disease, and CHF.  She has obstructive sleep apnea.  She was discharged to home 07/14/2024 and readmitted with weakness on 07/16/2024.  She has remained hospitalized with failure to thrive.  She has been evaluated by neurology.  Imaging of the CNS has revealed evolving infarcts.  She was diagnosed with cefepime  induced neurotoxicity and an EEG confirmed seizure activity.  A lumbar puncture ruled out meningitis.  Her neurologic status has not improved.  She is currently not moving her arms or legs.  Her husband reports the arm weakness has progressed over the past week.  She has been evaluated by speech therapy and is at risk for aspiration.  A feeding tube is in place.  Her chief complaint is pain in the back .  The pain is not controlled with the current narcotic regimen.       Objective: Vital signs in last 24 hours: Blood pressure (!) 163/74, pulse 93, temperature 97.8 F (36.6 C), temperature source Oral, resp. rate 18, height 5' 2 (1.575 m), weight 180 lb (81.6 kg), last menstrual period 10/30/2014, SpO2 100%.  Intake/Output from previous day: 12/06 0701 - 12/07 0700 In: 525 [NG/GT:525] Out: 800  [Urine:800]  Physical Exam:  HEENT: No thrush Lungs: Distant breath sounds, no respiratory distress Cardiac: Regular rate and rhythm Abdomen: Soft and nontender, no mass, no hepatosplenomegaly Extremities: Trace edema in the upper and lower extremities Neurologic: Alert, has fluent speech, not moving the arms or legs, sensation intact to pain in the extremities, appears oriented to diagnosis  Portacath/PICC-without erythema  Lab Results: Recent Labs    08/07/24 0600  WBC 10.0  HGB 9.6*  HCT 29.9*  PLT 352    BMET Recent Labs    08/07/24 0600  NA 138  K 3.8  CL 100  CO2 29  GLUCOSE 96  BUN 18  CREATININE 0.35*  CALCIUM  8.7*    No results found for: CEA1, CEA, CAN199, CA125  Studies/Results: No results found.  Medications: I have reviewed the patient's current medications.  Assessment/Plan:  Endometrial cancer Diagnosed in 2023, status post surgery and radiation Recurrent disease July 2025 with a left retroperitoneal mass, biopsy confirmed adenocarcinoma, ER positive, MSI-high, tumor mutation burden 24, PD-L1 75%, PIK 3CA mutated, PTEN mutated, HER2 1+, loss of MLH1 expression 04/03/2024 CT chest: Spiculated 9 x 12 mm right upper lobe and part solid 4 x 6 mm right upper lobe nodules Cycle 1 paclitaxel /carboplatin /pembrolizumab  05/01/2024 Cycle 2 paclitaxel /carboplatin /pembrolizumab  06/30/2024 06/19/2024 CT abdomen/pelvis: Right lower lobe pulmonary embolism, splenic infarct, stable size of the left psoas mass with evidence of erosive change at the anterior aspect of L2, decreased size of left external iliac node  2.   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  DVT 06/02/2024 4.  Pulmonary embolism noted on CT chest 06/19/2024  5.  Diabetes 6.  CHF/CAD 7.  Seizures, cefepime  toxicity?  November 2025 8.  Progressive generalized loss of motor function-related to CVAs?,  Paraneoplastic? 9.  Respiratory failure 10.  Pain secondary to #1  Brenda [name] has a history of  endometrial cancer initially diagnosed in 2020.  She was diagnosed with recurrent disease involving a left retroperitoneal mass in July 2025.  She completed 2 treatments with paclitaxel /carboplatin /pembrolizumab .  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  She completed 2 cycles of systemic therapy for treatment of the uterine cancer.  She has not undergone a restaging evaluation for the past several months.  She has an MSI high tumor which is expected to have a high chance of clinical improvement with immunotherapy based treatment.  I reviewed the October 2025 CT images.  She has a poor performance status at present which precludes systemic therapy.  The pain is most likely secondary to the retroperitoneal mass.  The etiology of the diffuse generalized weakness is unclear.  There is likely component related to the embolic CVAs and deconditioning, but the evolution of the loss of motor function over the past several weeks appears to be more severe than expected.  She has undergone an extensive neurologic evaluation over the past month.  It is possible the motor weakness is related to a paraneoplastic syndrome, or less likely toxicity from immunotherapy.  I discussed the poor prognosis with Brenda [name] and her husband.  I explained she will not be a candidate for further treatment of the metastatic uterine cancer unless her performance status improves.  I support continued sessions in the setting of uterine cancer, severe neurologic death, and an ECOG 4 performance status.  She appears to be a candidate for hospice care.  Recommendations: Adjust narcotic pain regimen per the palliative care service Reconsult neurology to evaluate motor function Anticoagulation for the DVT/PE and  CVAs Tube feedings, follow-up with speech pathology Seizure management per neurology Consider restaging CT abdomen/pelvis to follow-up on the retroperitoneal mass following 2 cycles of systemic therapy Continue discussions with the patient and her husband regarding goals of care  Oncology will continue following her in the hospital           LOS: 23 days   Arley Hof, MD   08/09/2024, 10:53 AM

## 2024-08-09 NOTE — Plan of Care (Signed)
   Problem: Coping: Goal: Ability to adjust to condition or change in health will improve Outcome: Progressing   Problem: Fluid Volume: Goal: Ability to maintain a balanced intake and output will improve Outcome: Progressing   Problem: Metabolic: Goal: Ability to maintain appropriate glucose levels will improve Outcome: Progressing

## 2024-08-09 NOTE — Progress Notes (Signed)
 EEG complete - results pending

## 2024-08-09 NOTE — Progress Notes (Signed)
 PROGRESS NOTE    Geselle Cardosa  FMW:[id number] DOB: [birth year] DOA: 07/15/2024 PCP: [facility]  No chief complaint on file.   Brief Narrative:   Per 33/30 note 63 year old female with history of metastatic endometrial cancer, chronic cancer-related pain, hypertension, hyperlipidemia, diabetes mellitus, depression, anxiety, CVA, history of DVT and PE anticoagulated on Lovenox , OSA, chronic hypoxic respiratory failure on 2L via nasal cannula and recent hospitalization and discharged on 07/14/2024 for UTI treated with IV antibiotics followed by oral antibiotics on discharge presented with chills and weakness.  She was found to have UTI and was started on IV cefepime .  Unfortunately mental status worsened and she was transferred to [facility] for continuous EEG monitoring with 4 seizures noted on video EEG. Started on Keppra  11/16. Placed on empiric broad-spectrum antibiotics for possible meningitis/encephalitis which was ruled out based on CSF results. As per husband, mental status worsened since antibiotics stopped. Not able to participate with any meaningful history and following only few commands.    No improvement in mentation, developed some loose stool that was attributed to tube feed formula with no concern for infection. Nothing much to offer from oncology and they advised palliative care. Family meeting with palliative and they want her to be transferred [facility].  Discussed with her oncologist as we do not have any diagnosis which cannot be treated at current facility, transfer will not happen for Anyely Cunning second opinion.  Per oncology, family need to arrange themself to take her to any tertiary care center of their choice , apparently has an outpatient appointment with [facility] oncology in December. Husband still hopeful the patient will improve in terms of mental status.  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 Radovich and her husband beginning to talk about palliative care.  I had Asharia Lotter conversation with them about comfort focused care and hospice.  At this point, working up the acute issues below, but I think ultimately comfort focused care is likely to an appropriate care plan based on her presentation.  Husband asking if we're planning for comfort care/hospice, could we restart abx she was previously on - I asked him to let me review chart Vanna Shavers bit more and follow the planned imaging studies (but we like to know what we're treating) - will follow.   Rigidity  Subclinical Seizures Note 11/15 she had stiffening of upper extremities and was transferred to [facility] for Continuous EEG. 4 seizures noted 11/16 S/p LP, meningitis ruled out Her husband notes worsening stiffness/rigidity/inability to move extremities starting about 1 week ago  Will reconsult neurology EEG, head CT Continue keppra    Acute Metabolic Encephalopathy Sounds like suspicion that this and seizures above related to cefepime  Delirium precautions  Caution with pain meds - issues with somnolence on fentanyl , will balance need for opiates with sedation  Metastatic Endometrial Cancer Cancer Related Pain  Appreciate oncology consult  CT chest/abdomen/pelvis for persistent (worsening?)  back pain (note centrally necrotic infiltrative mass in L psoas in 06/2024) Will continue to adjust pain meds  Dysphagia Cortrak + tube  feeds  Depression Anxiety Prozac  per palliative  Anemia S/p 1 unit pRBC 12/1 S/p IV iron   Normal folate, b12.  Normal iron , ferritin.   Pulmonary Embolism Splenic Infarct Lovenox   CAD  Hx Stroke On therapeutic lovenox  above  Hypernatremia Resolved  Abnormal Thyroid  Function Tests Elevated free T4, low TSH (not fully suppressed) - unclear significance, will repeat labs  Negative TSI  T2DM Lantus , SSI  Pressure Ulcer Wound 07/25/24 1100 Pressure Injury Sacrum Medial Deep Tissue Pressure Injury - Purple or maroon localized area of discolored intact skin or blood-filled blister due to damage of underlying soft tissue from pressure and/or shear. (Active)   Obesity Body mass index is 32.92 kg/m.     DVT prophylaxis: lovenox  Code Status: DNR Family Communication: husband at bedside Disposition:   Status is: Inpatient Remains inpatient appropriate because: need for continued inpatient care   Consultants:  Neurology Palliative care oncology  Procedures:  11/21 LP  EEG  Antimicrobials:  Anti-infectives (From admission, onward)    Start     Dose/Rate Route Frequency Ordered Stop   07/23/24 0600  vancomycin  (VANCOCIN ) IVPB 1000 mg/200 mL premix  Status:  Discontinued        1,000 mg 200 mL/hr over 60 Minutes Intravenous Every 12 hours 07/22/24 1834 07/24/24 1808   07/22/24 2200  meropenem  (MERREM ) 2 g in sodium chloride  0.9 % 100 mL IVPB  Status:  Discontinued        2 g 280 mL/hr over 30 Minutes Intravenous Every 8 hours 07/22/24 1601 07/24/24 1808   07/22/24 1700  vancomycin  (VANCOREADY) IVPB 1500 mg/300 mL        1,500 mg 150 mL/hr over 120 Minutes Intravenous  Once 07/22/24 1601 07/22/24 2031   07/22/24 1645  ampicillin  (OMNIPEN) 2 g in sodium chloride  0.9 % 100 mL IVPB  Status:  Discontinued        2 g 300 mL/hr over 20 Minutes Intravenous Every 6 hours 07/22/24 1557 07/24/24 1800   07/19/24 0530  meropenem  (MERREM ) 1 g in sodium chloride  0.9 % 100  mL IVPB  Status:  Discontinued        1 g 200 mL/hr over 30 Minutes Intravenous Every 8 hours 07/19/24 0438 07/22/24 1601   07/16/24 0900  ceFEPIme  (MAXIPIME ) 2 g in sodium chloride  0.9 % 100 mL IVPB  Status:  Discontinued        2 g 200 mL/hr over 30 Minutes Intravenous Every 8 hours 07/16/24 0811 07/19/24 0409   07/16/24 0815  ceFEPIme  (MAXIPIME ) 2 g in sodium chloride  0.9 % 100 mL IVPB  Status:  Discontinued        2 g 200 mL/hr over 30 Minutes Intravenous  Once 07/16/24 0803 07/16/24 0811   07/16/24 0330  cefTRIAXone  (ROCEPHIN ) 2 g in sodium chloride  0.9 % 100 mL IVPB        2 g 200 mL/hr over 30 Minutes Intravenous  Once 07/16/24 0315 07/16/24 0412       Subjective: Notes c/o back pain  7/10 (husband notes she always says 7)  Objective: Vitals:   08/08/24 1947 08/09/24 0111 08/09/24 0500 08/09/24 0828  BP: (!) 152/68 (!) 159/74 (!) 171/93 (!) 163/74  Pulse: 69 71 83 93  Resp: 20   18  Temp: 98.1 F (36.7 C) 98.2 F (36.8 C) 98.2 F (36.8 C) 97.8 F (  36.6 C)  TempSrc: Axillary Axillary Axillary Oral  SpO2: 100% 100% 100% 100%  Weight:   81.6 kg   Height:        Intake/Output Summary (Last 24 hours) at 08/09/2024 1137 Last data filed at 08/09/2024 9178 Gross per 24 hour  Intake 450 ml  Output --  Net 450 ml   Filed Weights   08/04/24 0500 08/08/24 0500 08/09/24 0500  Weight: 81.2 kg 81.6 kg 81.6 kg    Examination:  General exam: Appears calm and comfortable  Respiratory system: unlabored Cardiovascular system: RRR Gastrointestinal system: Abdomen is nondistended, soft and nontender. Central nervous system: able to communicate, takes time for her to speak - rigid - head turned to right, arms extended, hands with fists clinched, toes torsiflexed, feet plantarflexed.  Arms stay elevated with passive elevation, fall slowly to bed.   Extremities: no LEE   Data Reviewed: I have personally reviewed following labs and imaging studies  CBC: Recent Labs  Lab  08/03/24 0319 08/04/24 0555 08/05/24 0500 08/07/24 0600  WBC 7.4 6.8 8.3 10.0  HGB 6.8* 8.0* 9.5* 9.6*  HCT 21.2* 24.4* 29.2* 29.9*  MCV 86.2 84.4 85.4 86.4  PLT 269 264 340 352    Basic Metabolic Panel: Recent Labs  Lab 08/03/24 0319 08/04/24 0555 08/05/24 0500 08/07/24 0600  NA 133* 134* 137 138  K 4.3 4.0 4.3 3.8  CL 99 98 96* 100  CO2 28 30 31 29   GLUCOSE 204* 122* 133* 96  BUN 18 15 15 18   CREATININE 0.45 0.41* 0.44 0.35*  CALCIUM  8.4* 8.3* 8.9 8.7*    GFR: Estimated Creatinine Clearance: 73.1 mL/min (Lundon Rosier) (by C-G formula based on SCr of 0.35 mg/dL (L)).  Liver Function Tests: No results for input(s): AST, ALT, ALKPHOS, BILITOT, PROT, ALBUMIN in the last 168 hours.  CBG: Recent Labs  Lab 08/08/24 1506 08/08/24 1955 08/09/24 0108 08/09/24 0515 08/09/24 0835  GLUCAP 176* 153* 105* 101* 83     No results found for this or any previous visit (from the past 240 hours).       Radiology Studies: No results found.      Scheduled Meds:  acetaminophen   1,000 mg Per Tube TID   atorvastatin   80 mg Per Tube QHS   Chlorhexidine  Gluconate Cloth  6 each Topical Daily   enoxaparin  (LOVENOX ) injection  1 mg/kg Subcutaneous Q12H   feeding supplement  237 mL Oral Q24H   feeding supplement (PROSource TF20)  60 mL Per Tube Daily   FLUoxetine   10 mg Per Tube Daily   free water   75 mL Per Tube Q4H   insulin  aspart  0-15 Units Subcutaneous Q4H   insulin  aspart  4 Units Subcutaneous Q4H   insulin  glargine-yfgn  25 Units Subcutaneous BID   levETIRAcetam   500 mg Per Tube BID   lidocaine   1 patch Transdermal Daily   oxyCODONE   10 mg Oral QHS   propranolol   20 mg Per Tube TID   sodium chloride  flush  10-40 mL Intracatheter Q12H   Continuous Infusions:  feeding supplement (JEVITY 1.5 CAL/FIBER) 45 mL/hr at 08/05/24 1647     LOS: 23 days    Time spent: over 30 min     Meliton Monte, MD [facility]   To contact the attending provider  between 7A-7P or the covering provider during after hours 7P-7A, please log into the web site [email or web address] and access using universal [place] password for that web site. If you do not have  the password, please call the hospital operator.  08/09/2024, 11:37 AM

## 2024-08-10 DIAGNOSIS — G9341 Metabolic encephalopathy: Secondary | ICD-10-CM | POA: Diagnosis not present

## 2024-08-10 DIAGNOSIS — R29898 Other symptoms and signs involving the musculoskeletal system: Secondary | ICD-10-CM

## 2024-08-10 LAB — GLUCOSE, CAPILLARY
Glucose-Capillary: 147 mg/dL — ABNORMAL HIGH (ref 70–99)
Glucose-Capillary: 101 mg/dL — ABNORMAL HIGH (ref 70–99)
Glucose-Capillary: 118 mg/dL — ABNORMAL HIGH (ref 70–99)
Glucose-Capillary: 121 mg/dL — ABNORMAL HIGH (ref 70–99)
Glucose-Capillary: 129 mg/dL — ABNORMAL HIGH (ref 70–99)
Glucose-Capillary: 127 mg/dL — ABNORMAL HIGH (ref 70–99)

## 2024-08-10 LAB — CBC WITH DIFFERENTIAL/PLATELET
Abs Immature Granulocytes: 0.07 K/uL (ref 0.00–0.07)
Basophils Absolute: 0 K/uL (ref 0.0–0.1)
Basophils Relative: 0 %
Eosinophils Absolute: 0.2 K/uL (ref 0.0–0.5)
Eosinophils Relative: 3 %
HCT: 27.1 % — ABNORMAL LOW (ref 36.0–46.0)
Hemoglobin: 8.6 g/dL — ABNORMAL LOW (ref 12.0–15.0)
Immature Granulocytes: 1 %
Lymphocytes Relative: 18 %
Lymphs Abs: 1.5 K/uL (ref 0.7–4.0)
MCH: 28.5 pg (ref 26.0–34.0)
MCHC: 31.7 g/dL (ref 30.0–36.0)
MCV: 89.7 fL (ref 80.0–100.0)
Monocytes Absolute: 0.5 K/uL (ref 0.1–1.0)
Monocytes Relative: 5 %
Neutro Abs: 6.2 K/uL (ref 1.7–7.7)
Neutrophils Relative %: 73 %
Platelets: 266 K/uL (ref 150–400)
RBC: 3.02 MIL/uL — ABNORMAL LOW (ref 3.87–5.11)
RDW: 18.9 % — ABNORMAL HIGH (ref 11.5–15.5)
WBC: 8.6 K/uL (ref 4.0–10.5)
nRBC: 0 % (ref 0.0–0.2)

## 2024-08-10 LAB — COMPREHENSIVE METABOLIC PANEL WITH GFR
ALT: 176 U/L — ABNORMAL HIGH (ref 0–44)
AST: 103 U/L — ABNORMAL HIGH (ref 15–41)
Albumin: 2.1 g/dL — ABNORMAL LOW (ref 3.5–5.0)
Alkaline Phosphatase: 193 U/L — ABNORMAL HIGH (ref 38–126)
Anion gap: 5 (ref 5–15)
BUN: 15 mg/dL (ref 8–23)
CO2: 28 mmol/L (ref 22–32)
Calcium: 8.4 mg/dL — ABNORMAL LOW (ref 8.9–10.3)
Chloride: 103 mmol/L (ref 98–111)
Creatinine, Ser: 0.37 mg/dL — ABNORMAL LOW (ref 0.44–1.00)
GFR, Estimated: >60 mL/min (ref >60)
Glucose, Bld: 135 mg/dL — ABNORMAL HIGH (ref 70–99)
Potassium: 4.3 mmol/L (ref 3.5–5.1)
Sodium: 136 mmol/L (ref 135–145)
Total Bilirubin: 0.6 mg/dL (ref 0.0–1.2)
Total Protein: 5.4 g/dL — ABNORMAL LOW (ref 6.5–8.1)

## 2024-08-10 LAB — PHOSPHORUS: Phosphorus: 3.8 mg/dL (ref 2.5–4.6)

## 2024-08-10 LAB — MAGNESIUM: Magnesium: 1.8 mg/dL (ref 1.7–2.4)

## 2024-08-10 LAB — CK: Total CK: 105 U/L (ref 38–234)

## 2024-08-10 MED ORDER — ACETAMINOPHEN 500 MG PO TABS
500.0000 mg | ORAL_TABLET | Freq: Four times a day (QID) | ORAL | Status: DC
  Administered 2024-08-10 (×4): 500 mg
  Filled 2024-08-10 (×4): qty 1

## 2024-08-10 MED ORDER — BACLOFEN 10 MG PO TABS
5.0000 mg | ORAL_TABLET | Freq: Two times a day (BID) | ORAL | Status: DC
  Administered 2024-08-10 – 2024-08-16 (×13): 5 mg
  Filled 2024-08-10 (×13): qty 1

## 2024-08-10 MED ORDER — BACLOFEN 25 MG/5ML PO SUSP
5.0000 mg | Freq: Two times a day (BID) | ORAL | Status: DC
  Filled 2024-08-10 (×2): qty 1

## 2024-08-10 NOTE — Progress Notes (Addendum)
 Daily Progress Note   Date: 08/10/2024   Patient Name: Latarsha Zani  DOB: 06/29/1961  MRN: [id number]  Age / Sex: 63 y.o., female  Attending Physician: Perri DELENA Meliton Mickey., * Primary Care Physician: [facility] Admit Date: 07/15/2024 Length of Stay: 24 days  Reason for Follow-up: Establishing goals of care  Past Medical History:  Diagnosis Date   Anemia    Arthritis    back, hands, hips (02/22/2015)   CAD (coronary artery disease)    a. complex LAD/diagonal bifurcation PCI in 2010. b. STEMI 10/2019 s/p PTCA/DES x1 to mLAD overlapping the old stent, residual disease treated medicaly.   Cancer [facility])    uterine   CHF (congestive heart failure) (HCC)    Depression    Diabetes mellitus (HCC)    started when I was pregnant; not sure if it was type 1 or type 2    History of radiation therapy    Endometrium- HDR 01/22/22-03/07/22- Dr. Lynwood Nasuti   Hypercholesterolemia    Hypertension    Morbid obesity (HCC)    Myocardial infarction (HCC)    mild x 3   Neuromuscular disorder (HCC)    neuropathy feet   Sleep apnea    cpap/    Assessment & Plan:   HPI/Patient Profile:  63 y.o. 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   Palliative medicine consulted for goals of care conversation.  SUMMARY OF RECOMMENDATIONS DNR-limited Continue efforts to improve mentation including UA/Ucx for UTI assessment Husband will discuss with daughters about whether or not to transition to full comfort measures Symptom management as below  Symptom Management:  Fluoxetine  10 mg daily for depression Oxycodone  10 mg nightly for pain Oxycodone  2.5 mg PRN Q3 for breakthrough pain  Code Status: DNR - Limited (DNR/DNI)  Prognosis: < 6 months  Discharge Planning: To Be Determined  Discussed  with: Perri MD 08/10/2024 about family plan to discuss amongst themselves about full comfort measures, but also wanting further work up of somnolence and BUE rigidity.    Subjective:   Subjective: Chart Reviewed. Updates received. Patient Assessed. Created space and opportunity for patient  and family to explore thoughts and feelings regarding current medical situation.  Per oncology note on 08/09/2024 by Cloretta MD, conversation with husband about poor prognosis, patient not a candidate for further chemotherapy until performance status improves, and that patient may be a candidate for hospice care.   Per neurology note on 08/09/2024 by Matthews MD, EEG performed without any epileptogenic waveforms, diffuse slowing indicative of global cerebral dysfunction.  Per neurology note on 08/09/2024 by Rochelle MD, plan for MRI brain and C spine to further assess encephalopathy and BUE rigidity.   MRI Brain on 08/09/2024 did not demonstrate any interval acute abnormality and no metastatic disease.  MRI spine on 08/09/2024 did not demonstrate metastatic disease.  CT A/P on 08/09/2024 demonstrated new right psoas muscle mass-like enlargement concerning for hematoma vs tumor, left psoas muscle showed decrease in size to 2.8 x 1.9 cm from 10.2 x 8.5 x 7.5 cm compared to prior scan.   Today's Discussion:  Met with patient and husband at the bedside and had a lengthy discussion. Patient was somnolent during the visit and nonresponsive to voice or stimulation when Neurology NP assessed.   Husband shared that the patient expressed to him that she wants to be  comfortable. He shares that she said that I can't take this pain anymore. After further exploration, husband was not yet ready to fully transition to comfort measures when it was shared with him that it would mean shifting full focus on treating her pain and anxiety that she has expressed. He shares that oncology does not recommend chemotherapy at this stage and  that he should consider hospice going forward. Also discussed discontinuing cortrak if she was to fully transition to comfort measures and husband shared that patient is not ready for that and would like to continue it for the time being. He shared that he would also not want her to starve. Shared with him that when patients are this sick with increased somnolence, they might not feel the sensation of starving. He understood, but was not ready to make any decision yet. He shares that he will discuss with his daughters about transitioning to full comfort measures. Husband was understandably emotional multiple times throughout the meeting.   Husband shared his concerns for increased somnolence and the BUE rigidity. He is concerned that the somnolence is due to another UTI and would like further work up and possibly treat it to help with her mentation. He shares that he does not want her treated to prolong her suffering, he knows that continuing aggressive measures at this point is far too late, but he wants her mentation to improve so that she is able to interact with family and friends who visit. Neurology NP visited during meeting to further assess patient's BUE rigidity and discussed with husband that the MRI head and spine did not show any signs that would explain the patient's somnolence or rigidity.   Husband shares that he is frustrated with [facility] and that he wants to leave when his wife passes away and never come back. Shared with him that if the patient transitioned to hospice, it would be a way for him to get away from [facility], but he was not ready to transition and wanted more consideration.   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     Comments: Somnolent, not arousable.   HENT:     Head: Normocephalic.     Nose: Nose normal.     Comments: Cortrak in place.  Pulmonary:     Effort: Pulmonary effort is normal.     Comments: Nasal cannula in place.    Palliative Assessment/Data: 10%   Existing Vynca/ACP Documentation: None  Thank you for allowing us  to participate in the care of Azucena Dart PMT will continue to support holistically.  I personally spent a total of 65 minutes in the care of the patient today including preparing to see the patient, getting/reviewing separately obtained history, performing a medically appropriate exam/evaluation, counseling and  educating, referring and communicating with other health care professionals, and documenting clinical information in the EHR.  [name]  Palliative Medicine Team  Team Phone # [phone] (Nights/Weekends) 08/10/2024 3:46 PM

## 2024-08-10 NOTE — Progress Notes (Signed)
 IP PROGRESS NOTE  Subjective:   Brenda [name] reports improved pain control with the current narcotic regimen.  She continues to have back pain.  Her daughter was at the bedside when I saw her at approximately 7 AM.       Objective: Vital signs in last 24 hours: Blood pressure (!) 151/78, pulse 86, temperature (!) 97.5 F (36.4 C), temperature source Oral, resp. rate 16, height 5' 2 (1.575 m), weight 184 lb (83.5 kg), last menstrual period 10/30/2014, SpO2 100%.  Intake/Output from previous day: 12/07 0701 - 12/08 0700 In: 450 [NG/GT:450] Out: 1400 [Urine:1400]  Physical Exam:  HEENT: No thrush Abdomen: Soft and nontender, no mass, no hepatosplenomegaly Extremities: Trace edema in the upper and lower extremities Neurologic: Alert, has fluent speech, weak bilateral grip strength, otherwise not moving the arms, feet, or legs, sensation intact to pain in the extremities  Portacath/PICC-without erythema  Lab Results: Recent Labs    08/09/24 1221 08/10/24 0405  WBC 11.4* 8.6  HGB 10.1* 8.6*  HCT 30.8* 27.1*  PLT 324 266    BMET Recent Labs    08/09/24 1221 08/10/24 0405  NA 136 136  K 4.0 4.3  CL 101 103  CO2 29 28  GLUCOSE 86 135*  BUN 15 15  CREATININE 0.49 0.37*  CALCIUM  8.5* 8.4*    No results found for: CEA1, CEA, CAN199, CA125  Studies/Results: MR CERVICAL SPINE W WO CONTRAST Result Date: 08/09/2024 EXAM: MRI CERVICAL SPINE WITH AND WITHOUT CONTRAST 08/09/2024 07:04:28 PM TECHNIQUE: Multiplanar multisequence MRI of the cervical spine was performed without and with the administration of intravenous contrast. CONTRAST: 8 mL of Gadavist . COMPARISON: None available. CLINICAL HISTORY: Metastatic disease evaluation, myelopathy, acute, cervical spine. FINDINGS: BONES AND ALIGNMENT: Normal alignment. Normal vertebral body heights. No suspicious bone lesions. No abnormal enhancement. SPINAL CORD: Normal spinal cord size. Normal spinal cord signal. SOFT  TISSUES: No paraspinal mass. C2-C3: No significant disc herniation. No spinal canal stenosis or neural foraminal narrowing. C3-C4: Left facet and uncovertebral hypertrophy. Mild left foraminal stenosis. Patent canal. C4-C5: No significant disc herniation. No spinal canal stenosis or neural foraminal narrowing. C5-C6: Right facet and uncovertebral hypertrophy. Mild right foraminal stenosis. Patent canal. C6-C7: Right facet and uncovertebral hypertrophy. Mild right foraminal stenosis. Patent canal. C7-T1: No significant disc herniation. No spinal canal stenosis or neural foraminal narrowing. IMPRESSION: 1. No evidence of metastatic disease. 2. Mild foraminal stenosis on the left at C3-C4 and right at C5-C6 and C6-C7. Electronically signed by: Gilmore Molt MD 08/09/2024 09:23 PM EST RP Workstation: [id number]   MR BRAIN W WO CONTRAST Result Date: 08/09/2024 EXAM: MRI BRAIN WITH AND WITHOUT CONTRAST 08/09/2024 07:04:11 PM TECHNIQUE: Multiplanar multisequence MRI of the head/brain was performed with and without the administration of intravenous contrast. CONTRAST: 8 mL of Gadavist . COMPARISON: MR Brain without Contrast 07/18/2024. CLINICAL HISTORY: Metastatic disease evaluation, neuro deficit, acute, stroke suspected. FINDINGS: BRAIN AND VENTRICLES: Multifocally evolving infarcts in the anterior and posterior circulation including small infarct in bilateral frontoparietal white matter and right occipital lobe with developing encephalomalacia and decreased restricted diffusion. No evidence of new/interval acute infarct. No acute intracranial hemorrhage. No mass effect or midline shift. No hydrocephalus. Normal flow voids. No mass or abnormal enhancement. ORBITS: No acute abnormality. SINUSES: No acute abnormality. BONES AND SOFT TISSUES: Normal bone marrow signal and enhancement. No acute soft tissue abnormality. IMPRESSION: 1. Evolving infarcts without new/interval acute abnormality. 2. No evidence of metastatic  disease. Electronically signed by: Gilmore Molt MD 08/09/2024 08:50  PM EST RP Workstation: [id number]   CT CHEST ABDOMEN PELVIS W CONTRAST Result Date: 08/09/2024 EXAM: CT CHEST, ABDOMEN AND PELVIS WITH CONTRAST 08/09/2024 05:06:09 PM TECHNIQUE: CT of the chest, abdomen and pelvis was performed with the administration of intravenous contrast. 75 mL of iohexol  (OMNIPAQUE ) 350 MG/ML injection was administered. Multiplanar reformatted images are provided for review. Automated exposure control, iterative reconstruction, and/or weight based adjustment of the mA/kV was utilized to reduce the radiation dose to as low as reasonably achievable. COMPARISON: CT abdomen and pelvis 06/19/2024. CLINICAL HISTORY: Metastatic disease evaluation. FINDINGS: CHEST: MEDIASTINUM AND LYMPH NODES: Heart and pericardium are unremarkable. The central airways are clear. No mediastinal, hilar or axillary lymphadenopathy. LUNGS AND PLEURA: No focal consolidation or pulmonary edema. No pleural effusion or pneumothorax. No pulmonary nodules. ABDOMEN AND PELVIS: LIVER: The liver is unremarkable. GALLBLADDER AND BILE DUCTS: Prior cholecystectomy. Slight intrahepatic and extrahepatic biliary ductal dilatation likely related to post-cholecystectomy state, stable since prior study. SPLEEN: Peripheral wedge-shaped low-density area posteriorly in the spleen, likely splenic infarct, decreased in size since prior study. PANCREAS: No acute abnormality. ADRENAL GLANDS: No acute abnormality. KIDNEYS, URETERS AND BLADDER: No stones in the kidneys or ureters. No hydronephrosis. No perinephric or periureteral stranding. Urinary bladder is unremarkable. GI AND BOWEL: Stomach demonstrates no acute abnormality. Feeding tube tip in the proximal stomach. There is no bowel obstruction. REPRODUCTIVE ORGANS: Prior hysterectomy. PERITONEUM AND RETROPERITONEUM: No ascites. No free air. VASCULATURE: Aorta is normal in caliber. Coronary artery and aortic  atherosclerosis. Right port-a-cath in place with the tip at the cavoatrial junction. ABDOMINAL AND PELVIS LYMPH NODES: No lymphadenopathy. BONES AND SOFT TISSUES: No acute osseous abnormality. Previously seen left psoas/paraspinal mass has decreased significantly in size, now difficult to visualize but measuring approximately 2.8 x 1.9 cm, previously 10.2 x 8.5 x 7.5 cm. There is new abnormality/enlargement within the right psoas muscle with a mass-like area measuring 5 x 4.6 cm on image 83. This could reflect tumor or hematoma. IMPRESSION: 1. New right psoas muscle mass-like enlargement measuring 5.0 x 4.6 cm, indeterminate for tumor versus hematoma; recommend correlation with clinical status and consider further evaluation with contrast-enhanced MRI or short-interval follow-up imaging for characterization and to exclude progression of metastatic disease. 2. Previously noted left psoas/paraspinal mass has decreased significantly in size, now approximately 2.8 x 1.9 cm from 10.2 x 8.5 x 7.5 cm, compatible with treatment response. 3. Coronary artery disease, aortic atherosclerosis. Electronically signed by: Franky Crease MD 08/09/2024 08:25 PM EST RP Workstation: [id number]   EEG adult Result Date: 08/09/2024 Matthews Elida HERO, MD     08/10/2024  7:23 AM Routine EEG Report Betul Brisky is a 63 y.o. female with a history of altered mental status who is undergoing an EEG to evaluate for seizures. Report: This EEG was acquired with electrodes placed according to the International 10-20 electrode system (including Fp1, Fp2, F3, F4, C3, C4, P3, P4, O1, O2, T3, T4, T5, T6, A1, A2, Fz, Cz, Pz). The following electrodes were missing or displaced: none. The occipital dominant rhythm was 5-6 Hz. This activity is reactive to stimulation. Drowsiness was manifested by background fragmentation; deeper stages of sleep were identified by K complexes and sleep spindles. There was no focal slowing. There were no interictal  epileptiform discharges. There were no electrographic seizures identified. There was no abnormal response to photic stimulation or hyperventilation. Impression and clinical correlation: This EEG was obtained while awake and asleep and is abnormal due to moderate diffuse slowing indicative of global  cerebral dysfunction. Epileptiform abnormalities were not seen during this recording. [name], MD [facility] [phone] If 7pm- 7am, please page neurology on call as listed in AMION.   CT HEAD WO CONTRAST ( ) Result Date: 08/09/2024 EXAM: CT HEAD WITHOUT CONTRAST 08/09/2024 05:18:12 PM TECHNIQUE: CT of the head was performed without the administration of intravenous contrast. Automated exposure control, iterative reconstruction, and/or weight based adjustment of the mA/kV was utilized to reduce the radiation dose to as low as reasonably achievable. COMPARISON: 07/18/2024 CLINICAL HISTORY: Neuro deficit, acute, stroke suspected FINDINGS: BRAIN AND VENTRICLES: No acute hemorrhage. No evidence of acute infarct. Remote right PCA territory infarct. Unchanged calcified meningioma along the left frontal convexity. Calcific atherosclerosis. No hydrocephalus. No extra-axial collection. No mass effect or midline shift. ORBITS: No acute abnormality. SINUSES: No acute abnormality. SOFT TISSUES AND SKULL: No acute soft tissue abnormality. No skull fracture. IMPRESSION: 1. No acute intracranial abnormality. 2. Remote right PCA territory infarct. 3. Unchanged calcified left frontal convexity meningioma. Electronically signed by: Lonni Necessary MD 08/09/2024 06:23 PM EST RP Workstation: [id number]    Medications: I have reviewed the patient's current medications.  Assessment/Plan:  Endometrial cancer Diagnosed in 2023, status post surgery and radiation Recurrent disease July 2025 with a left retroperitoneal mass, biopsy confirmed adenocarcinoma, ER positive, MSI-high, tumor mutation burden 24,  PD-L1 75%, PIK 3CA mutated, PTEN mutated, HER2 1+, loss of MLH1 expression 04/03/2024 CT chest: Spiculated 9 x 12 mm right upper lobe and part solid 4 x 6 mm right upper lobe nodules Cycle 1 paclitaxel /carboplatin /pembrolizumab  05/01/2024 Cycle 2 paclitaxel /carboplatin /pembrolizumab  06/30/2024 06/19/2024 CT abdomen/pelvis: Right lower lobe pulmonary embolism, splenic infarct, stable size of the left psoas mass with evidence of erosive change at the anterior aspect of L2, decreased size of left external iliac node 08/09/2024 CTs: New psoas muscle mass (tumor versus hematoma), left psoas/paraspinal mass has decreased in size by my review the previously noted lung nodules have resolved  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  DVT 06/02/2024 4.  Pulmonary embolism noted on CT chest 06/19/2024 5.  Diabetes 6.  CHF/CAD 7.  Seizures, cefepime  toxicity?  November 2025 8.  Progressive generalized loss of motor function-related to CVAs?,  Paraneoplastic? 9.  Respiratory failure 10.  Pain secondary to #1 11.  Anemia  Brenda [name] has a history of endometrial cancer initially diagnosed in 2020.  She was diagnosed with recurrent disease involving a left retroperitoneal mass in July 2025.  She completed 2 treatments with paclitaxel /carboplatin /pembrolizumab .  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  She completed 2 cycles of systemic therapy for treatment of the uterine cancer.  The restaging CT yesterday reveals significant improvement in the left retroperitoneal mass, iliac adenopathy, and lung nodules.  There is a new right psoas lesion. the lesion appears rounded.  I reviewed the CT images.  I  suspect this is a benign finding, hematoma?.  She appears to have experienced significant improvement in the metastatic uterine cancer following the 2 cycles of systemic therapy.    The etiology of the diffuse generalized weakness is unclear.  There is likely a component related to the embolic CVAs and deconditioning, but the evolution of the loss of motor function over the past several weeks appears to be  more severe than expected.  She has undergone an extensive neurologic evaluation over the past month.  It is possible the motor weakness is related to a paraneoplastic syndrome, or less likely toxicity from immunotherapy.  Ms. Frasier will be a candidate to continue treatment with immunotherapy if her neurologic status improves.  The etiology of her pain is unclear based on the restaging CT.  The retroperitoneal mass is much smaller.  She could be having pain from the right psoas lesion or another etiology.  Recommendations: Continue narcotic analgesics as needed for pain Evaluation of the severe loss of motor function per neurology Anticoagulation for the DVT/PE and CVAs, will need to consider holding anticoagulation if the right psoas mass is confirmed to be a hematoma Tube feedings, follow-up with speech pathology Seizure management per neurology            LOS: 24 days   Arley Hof, MD   08/10/2024, 3:24 PM

## 2024-08-10 NOTE — Plan of Care (Signed)
 Patient is still confused

## 2024-08-10 NOTE — Progress Notes (Signed)
 NEUROLOGY CONSULT FOLLOW UP NOTE   Date of service: August 10, 2024 Patient Name: Brenda [name] MRN:  [id number] DOB:  1960-12-10  Interval Hx/subjective   Husband is at the bedside. Palliative team is in the room speaking with the husband. Husband states she recently received some pain medicine.  Husband states she is still very rigid in all extremities.   Vitals   Vitals:   08/10/24 0500 08/10/24 0800 08/10/24 0800 08/10/24 1124  BP:  (!) 104/49 (!) 104/49 (!) 162/71  Pulse:  64 64 99  Resp:  17 17   Temp:  99.2 F (37.3 C) 99.4 F (37.4 C) 98.9 F (37.2 C)  TempSrc:  Axillary Oral   SpO2:  100% 100% 100%  Weight: 83.5 kg     Height:         Body mass index is 33.65 kg/m.  Physical Exam   Constitutional: chronically ill female Eyes: No scleral injection.   HENT: No OP obstrucion.   Head: Normocephalic.   Cardiovascular: Normal rate and regular rhythm.   Respiratory: Effort normal, non-labored breathing.   GI: Soft.  No distension. There is no tenderness.   Skin: WDI.    Neurologic Examination   Mental Status -  Patient is asleep, does not open her eyes to voice or noxious stimuli. Not answering questions, nor following commands ( recent pain medication) Cranial Nerves II - XII - II - Visual field with blink to threat bilaterally III, IV, VI - Extraocular movements intact, dolls eyes intact, eyes are midline PERRL . V - Facial sensation intact bilaterally . VII - face is symmetric at rest  VIII - Hearing & vestibular intact bilaterally . X - Palate elevates symmetrically . XI - Chin turning & shoulder shrug intact bilaterally . XII - Tongue protrusion intact .  Motor Strength - withdraws to pain in all 4 extremities   Bulk was normal and fasciculations were absent .   Motor Tone - increased tone in all 4 extremities with rigidity with passive range of motion Reflexes - The patient's reflexes were symmetrical in all extremities and she had no  pathological reflexes . Sensory - responds to noxious stimuli  Coordination - unable to assess due to mental status  Gait and Station - deferred.  Medications  Current Facility-Administered Medications:    acetaminophen  (TYLENOL ) tablet 500 mg, 500 mg, Per Tube, QID, Perri DELENA Meliton Mickey., MD, 500 mg at 08/10/24 1001   artificial tears (LACRILUBE) ophthalmic ointment, , Both Eyes, Q4H PRN, Cosette Blackwater, MD, 1 Application at 08/03/24 2021   atorvastatin  (LIPITOR ) tablet 80 mg, 80 mg, Per Tube, QHS, Sigdel, Santosh, MD, 80 mg at 08/09/24 2230   Chlorhexidine  Gluconate Cloth 2 % PADS 6 each, 6 each, Topical, Daily, Chavez, Abigail, NP, 6 each at 08/10/24 9075   enoxaparin  (LOVENOX ) injection 80 mg, 1 mg/kg, Subcutaneous, Q12H, Tariq, Hassan, MD, 80 mg at 08/10/24 0914   feeding supplement (ENSURE PLUS HIGH PROTEIN) liquid 237 mL, 237 mL, Oral, Q24H, Lama, Gagan S, MD, 237 mL at 08/09/24 1715   feeding supplement (JEVITY 1.5 CAL/FIBER) liquid 1,000 mL, 1,000 mL, Per Tube, Continuous, Cosette Blackwater, MD, Last Rate: 45 mL/hr at 08/05/24 1647, Infusion Verify at 08/05/24 1647   feeding supplement (PROSource TF20) liquid 60 mL, 60 mL, Per Tube, Daily, Sigdel, Santosh, MD, 60 mL at 08/10/24 0921   fentaNYL  (SUBLIMAZE ) injection 12.5 mcg, 12.5 mcg, Intravenous, Q4H PRN, Ko, Joseph E, PA-C, 12.5 mcg at 08/09/24 [phone]  FLUoxetine  (PROZAC ) capsule 10 mg, 10 mg, Per Tube, Daily, Cooper, Josseline P, PA-C, 10 mg at 08/10/24 9081   free water  75 mL, 75 mL, Per Tube, Q4H, Cosette Blackwater, MD, 75 mL at 08/10/24 0800   insulin  aspart (novoLOG ) injection 0-15 Units, 0-15 Units, Subcutaneous, Q4H, Sigdel, Santosh, MD, 2 Units at 08/10/24 0402   insulin  aspart (novoLOG ) injection 4 Units, 4 Units, Subcutaneous, Q4H, Sigdel, Santosh, MD, 4 Units at 08/10/24 1200   insulin  glargine (LANTUS ) injection 20 Units, 20 Units, Subcutaneous, BID, Perri DELENA Meliton Mickey., MD, 20 Units at 08/10/24 9078   levETIRAcetam  (KEPPRA )  tablet 500 mg, 500 mg, Per Tube, BID, Laurence Jinnie BIRCH, RPH, 500 mg at 08/10/24 9085   lidocaine  (LIDODERM ) 5 % 1 patch, 1 patch, Transdermal, Daily, Alekh, Kshitiz, MD, 1 patch at 08/10/24 9083   loperamide  HCl (IMODIUM ) 1 MG/7.5ML suspension 2 mg, 2 mg, Per Tube, PRN, Merilee Linsey I, RPH, 2 mg at 08/05/24 9178   morphine  (PF) 2 MG/ML injection 2 mg, 2 mg, Intravenous, Q2H PRN, 2 mg at 08/10/24 0656 **OR** morphine  (PF) 2 MG/ML injection 4 mg, 4 mg, Intravenous, Q2H PRN, Perri DELENA Meliton Mickey., MD, 4 mg at 08/10/24 1201   naloxone  (NARCAN ) injection 0.4 mg, 0.4 mg, Intravenous, PRN, Alfornia Madison, MD   ondansetron  (ZOFRAN ) tablet 4 mg, 4 mg, Per Tube, Q6H PRN **OR** ondansetron  (ZOFRAN ) injection 4 mg, 4 mg, Intravenous, Q6H PRN, Cosette Blackwater, MD   oxyCODONE  (Oxy IR/ROXICODONE ) immediate release tablet 10 mg, 10 mg, Oral, QHS, Cooper, Josseline P, PA-C, 10 mg at 08/09/24 2231   oxyCODONE  (Oxy IR/ROXICODONE ) immediate release tablet 2.5 mg, 2.5 mg, Per Tube, Q3H PRN, Cooper, Josseline P, PA-C, 2.5 mg at 08/10/24 0656   prochlorperazine  (COMPAZINE ) tablet 10 mg, 10 mg, Oral, Q6H PRN, Foust, Katy L, NP   propranolol  (INDERAL ) tablet 20 mg, 20 mg, Per Tube, TID, Sigdel, Santosh, MD, 20 mg at 08/09/24 2024   senna-docusate (Senokot-S) tablet 1 tablet, 1 tablet, Oral, QHS PRN, Foust, Katy L, NP   sodium chloride  flush (NS) 0.9 % injection 10-40 mL, 10-40 mL, Intracatheter, Q12H, Sigdel, Santosh, MD, 10 mL at 08/10/24 0926   sodium chloride  flush (NS) 0.9 % injection 10-40 mL, 10-40 mL, Intracatheter, PRN, Mcarthur Pick, MD  Labs and Diagnostic Imaging   CBC:  Recent Labs  Lab 08/09/24 1221 08/10/24 0405  WBC 11.4* 8.6  NEUTROABS  --  6.2  HGB 10.1* 8.6*  HCT 30.8* 27.1*  MCV 87.3 89.7  PLT 324 266    Basic Metabolic Panel:  Lab Results  Component Value Date   NA 136 08/10/2024   K 4.3 08/10/2024   CO2 28 08/10/2024   GLUCOSE 135 (H) 08/10/2024   BUN 15 08/10/2024    CREATININE 0.37 (L) 08/10/2024   CALCIUM  8.4 (L) 08/10/2024   GFRNONAA >60 08/10/2024   GFRAA 94 12/16/2019   Lipid Panel:  Lab Results  Component Value Date   LDLCALC 28 05/30/2024   HgbA1c:  Lab Results  Component Value Date   HGBA1C 7.2 (H) 06/01/2024   Urine Drug Screen:     Component Value Date/Time   LABOPIA POSITIVE (A) 04/20/2024 1817   COCAINSCRNUR NONE DETECTED 04/20/2024 1817   LABBENZ NONE DETECTED 04/20/2024 1817   AMPHETMU NONE DETECTED 04/20/2024 1817   THCU NONE DETECTED 04/20/2024 1817   LABBARB NONE DETECTED 04/20/2024 1817    Alcohol Level     Component Value Date/Time   ETH <15 04/20/2024 1344  INR  Lab Results  Component Value Date   INR 1.0 07/16/2024   APTT  Lab Results  Component Value Date   APTT 67 (H) 06/21/2024   AED levels: No results found for: PHENYTOIN, ZONISAMIDE, LAMOTRIGINE, LEVETIRACETA  CT Head without contrast(Personally reviewed): 1. Chronic encephalomalacia changes in the right medial temporal and occipital lobes developed in the interim. 2. Unchanged small calcified meningioma along left frontal convexity.  MRI Brain(Personally reviewed): 1. Similar evolving infarcts in the anterior and posterior circulation. 2. No new infarcts, progressive mass effect or acute hemorrhage.  Repeat MRI Brain 12/7 1. Evolving infarcts without new/interval acute abnormality. 2. No evidence of metastatic disease.  MRI C spine 12/7 1. No evidence of metastatic disease. 2. Mild foraminal stenosis on the left at C3-C4 and right at C5-C6 and C6-C7.  LTM EEG 11/16-11/17:   ABNORMALITY - Seizures, generalized - Continuous slow, generalized   IMPRESSION: This study showed seizures with generalized onset during which patient was noted to bilateral upper extremity jerking.  4 seizures were noted on 07/19/2024 at 0820, 1305, 1600 and 1922 lasting between 30 minutes to 1.5 hours.     Additionally EEG showed generalized periodic  discharges with triphasic morphology with fluctuating frequency of 2 to 3 Hz.  This EEG pattern is on the ictal-interictal continuum with high suspicion for ictal nature.   Lastly there is generalized cerebral dysfunction (encephalopathy).  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   LTM EEG 11/17-11/18: ABNORMALITY - Periodic discharges with triphasic morphology, generalized - Continuous slow, generalized   IMPRESSION: This study showed generalized periodic discharges with triphasic morphology with fluctuating frequency of 2 to 2.5 Hz. This EEG pattern is on the ictal-interictal continuum with high suspicion for ictal nature.   Additionally there is generalized cerebral dysfunction (encephalopathy).      LTM EEG 11/18-11/19: ABNORMALITY - Periodic discharges with triphasic morphology, generalized - Continuous slow, generalized   IMPRESSION: This study showed generalized periodic discharges ( GPDs) with triphasic morphology. At the beginning of the study, the frequency of GPDs was 2-2.5Hz  which gradually improved to 1-2hz . This EEG pattern is on the ictal-interictal continuum.    Additionally there is generalized cerebral dysfunction (encephalopathy).        LP with CSF studies(07/24/24): WBC count of 8, meningitis/encephalitis panel is negative, CSF cultures with gram stain negative.  Assessment   Consandra Laske is a 63 y.o. 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 She subsequently continued to decline, becoming increasingly encephalopathic. She was subsequently transferred to [facility] for cEEG.   Initial LTM EEG recording showed 4 electrographic seizures on 07/19/24 with generalized onset lasting 30-90 min and associated with BUE jerking.  Additionally EEG showed generalized periodic discharges with triphasic morphology with fluctuating frequency of 2 to 3 Hz.  This EEG pattern is on the ictal-interictal continuum with high suspicion for ictal nature.   Clinical and electrographic seizures were favored to be driven primarily by cefepime  toxicity however given her prior (recent) stroke, known meningioma, and recurrent UTIs her risk of recurrent seizure was felt to be high enough to justify continuation of AED at hospital discharge (and indefinitely). Electrographic seizures captured 11/16 cannot be attributed to injury from prior strokes or cortical irritation from known meningioma because onset  was generalized, not focal. Of note, husband is concerned that she actually had a seizure prior to admission bc a similar shaking episode is what caused him to call 911.    She hs been seizure-free on keppra  and off cefepime . Her EEG background improved but mental status showed mild improvement. Given profound continued encephalopathy (not only 2/2 prn fentanyl ) she underwent LP to rule out concurrent meningitis.   Neurology team signed off after negative extensive workup. However, over the last week, noted to be more rigid and with continued encephalopathy, neurology was reconsulted to see if there is any further evaluation this warrants.  Recommendations  -Seizure precautions - Delirium precautions - Continue Keppra  500 mg BID  - will trial baclofen  5mg  BID to see if help with rigidity  - Neurology will continue to follow  _____________________________________________________________________   Signed, Karna DELENA Geralds, NP [facility]

## 2024-08-10 NOTE — TOC Progression Note (Signed)
 Transition of Care [facility]) - Progression Note    Patient Details  Name: Brenda [name] MRN: [id number] Date of Birth: Nov 19, 1960  Transition of Care [facility]) CM/SW Contact  [name], [place] Phone Number: 08/10/2024, 11:33 AM  Clinical Narrative:   CSW received call from patient's PCP, [facility], to discuss any updates. CSW provided brief medical update. [facility] was notified that the spouse has rescheduled the patient's second opinion with [facility] oncology for January 29; patient missed previously scheduled appointment as she remains hospitalized. CSW to follow.    Expected Discharge Plan:  (TBD) Barriers to Discharge: Continued Medical Work up               Expected Discharge Plan and Services                                               Social Drivers of Health (SDOH) Interventions SDOH Screenings   Food Insecurity: No Food Insecurity (07/16/2024)  Recent Concern: Food Insecurity - Food Insecurity Present (04/20/2024)  Housing: Low Risk  (07/16/2024)  Transportation Needs: No Transportation Needs (07/16/2024)  Utilities: Not At Risk (07/16/2024)  Depression (PHQ2-9): Low Risk  (06/30/2024)  Recent Concern: Depression (PHQ2-9) - Medium Risk (05/07/2024)  Financial Resource Strain: High Risk (02/10/2021)  Social Connections: Moderately Integrated (07/13/2024)  Tobacco Use: Medium Risk (07/16/2024)    Readmission Risk Interventions    07/14/2024    2:34 PM 05/04/2024   10:00 AM  Readmission Risk Prevention Plan  Transportation Screening Complete Complete  PCP or Specialist Appt within 3-5 Days  Complete  HRI or Home Care Consult  Complete  Social Work Consult for Recovery Care Planning/Counseling  Complete  Palliative Care Screening  Not Applicable  Medication Review [employer]) Complete Complete  PCP or Specialist appointment within 3-5 days of discharge Complete   HRI or Home Care Consult Complete   SW Recovery Care/Counseling  Consult Complete   Palliative Care Screening Not Applicable   Skilled Nursing Facility Not Applicable

## 2024-08-10 NOTE — Consult Note (Signed)
 WOC Nurse wound follow up Wound type: Stage 2 Pressure Injury Measurement: 0.2cm x 0.2cm x 0.1cm  Wound bed: only small open area less than 0.2cm  Drainage (amount, consistency, odor) none Periwound: scarring  Dressing procedure/placement/frequency: Protect with foam Turn and reposition  LALM in place for moisture management and pressure redistribution  Re consult if needed, will not follow at this time. Thanks  Nayelli [employer], RN,CWOCN, CNS, [employer] [phone]

## 2024-08-10 NOTE — Progress Notes (Signed)
 PROGRESS NOTE    Brenda [name]  FMW:[id number] DOB: [birth year] DOA: 07/15/2024 PCP: [facility]  No chief complaint on file.   Brief Narrative:   63 year old female with history of metastatic endometrial cancer, chronic cancer-related pain, hypertension, hyperlipidemia, diabetes mellitus, CVA, history of DVT and PE anticoagulated on Lovenox , OSA, chronic hypoxic respiratory failure on 2L via nasal cannula and recent hospitalization and discharged on 07/14/2024 for UTI treated with IV antibiotics followed by oral antibiotics on discharge presented with chills and weakness.  She was found to have UTI and was started on IV cefepime .  Unfortunately mental status worsened and she was transferred to [facility] for continuous EEG monitoring with 4 seizures noted on video EEG. She was on Keppra  11/16. Placed on empiric broad-spectrum antibiotics for possible meningitis/encephalitis which was ruled out based on CSF results. As per husband, mental status worsened since antibiotics stopped. Not able to participate with any meaningful history and following only few commands.    Neurology has been reconsulted with development of rigidity.  Oncology has been reconsulted.  Family has begun to discuss palliative care, but at this point managing her cancer related pain and working on rigidity with neurology.  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  [name] and her husband beginning to talk about palliative care.  I had Brenda [name] conversation with them about comfort focused care and hospice.  At this point, working up the acute issues below, but I think ultimately comfort focused care is likely to an appropriate care plan based on her presentation.  Husband asking if we're planning for comfort care/hospice, could we restart abx she was previously on - I asked him to let me review chart Brenda [name] bit more and follow the planned imaging studies (but we like to know what we're treating) - will follow.   Rigidity  Seizures Note 11/15 she had stiffening of upper extremities and was transferred to [facility] for Continuous EEG. 4 seizures noted 11/16 S/p LP, meningitis ruled out Her husband notes worsening stiffness/rigidity/inability to move extremities starting about 1 week ago  EEG without epileptiform abnormalities head CT without acute intracranial abnormality MRI brain with evolving infarcts - MRI C spine without evidence of metastatic disease Continue keppra   Awaiting additional neurology recommendations  Acute Metabolic Encephalopathy Sounds like suspicion that this and seizures above related to cefepime  Delirium precautions - she has continued hospital delirium - slow speech, often needs to be prompted multiple times to respond Caution with pain meds - issues with somnolence on fentanyl , will balance need for opiates with sedation  Metastatic Endometrial Cancer Cancer Related Pain  Appreciate oncology consult  CT chest/abdomen/pelvis for persistent (worsening?) back pain (note centrally necrotic infiltrative mass in L psoas in 06/2024) CT 12/7 with R psoas muscle mass like enlargement measuring 5.0 x 4.6 cm indeterminate for tumor vs hematoma (recommended correlation with clinical status and possible contrast enhanced mri or short interval follow imaging) -  L psoas/paraspinal mass decreased in size.   Appreciate oncology needs Will continue to adjust  pain meds  Elevated LFT's Monitor   Dysphagia Cortrak + tube feeds  Depression Anxiety Prozac  per palliative  Anemia S/p 1 unit pRBC 12/1 S/p IV iron   Normal folate, b12.  Normal iron , ferritin.   Pulmonary Embolism Splenic Infarct Lovenox   CAD  Hx Stroke On therapeutic lovenox  above  Hypernatremia Resolved  Abnormal Thyroid  Function Tests Repeat TSH and free T4 wnl Negative TSI  T2DM Lantus , SSI - adjust as needed  Pressure Ulcer Wound 07/25/24 1100 Pressure Injury Sacrum Medial Deep Tissue Pressure Injury - Purple or maroon localized area of discolored intact skin or blood-filled blister due to damage of underlying soft tissue from pressure and/or shear. (Active)   Obesity Body mass index is 33.65 kg/m.     DVT prophylaxis: lovenox  Code Status: DNR Family Communication: daughter at bedside 12/8 Disposition:   Status is: Inpatient Remains inpatient appropriate because: need for continued inpatient care   Consultants:  Neurology Palliative care oncology  Procedures:  11/21 LP  EEG  Antimicrobials:  Anti-infectives (From admission, onward)    Start     Dose/Rate Route Frequency Ordered Stop   07/23/24 0600  vancomycin  (VANCOCIN ) IVPB 1000 mg/200 mL premix  Status:  Discontinued        1,000 mg 200 mL/hr over 60 Minutes Intravenous Every 12 hours 07/22/24 1834 07/24/24 1808   07/22/24 2200  meropenem  (MERREM ) 2 g in sodium chloride  0.9 % 100 mL IVPB  Status:  Discontinued        2 g 280 mL/hr over 30 Minutes Intravenous Every 8 hours 07/22/24 1601 07/24/24 1808   07/22/24 1700  vancomycin  (VANCOREADY) IVPB 1500 mg/300 mL        1,500 mg 150 mL/hr over 120 Minutes Intravenous  Once 07/22/24 1601 07/22/24 2031   07/22/24 1645  ampicillin  (OMNIPEN) 2 g in sodium chloride  0.9 % 100 mL IVPB  Status:  Discontinued        2 g 300 mL/hr over 20 Minutes Intravenous Every 6 hours 07/22/24 1557 07/24/24 1800   07/19/24 0530  meropenem  (MERREM ) 1 g in  sodium chloride  0.9 % 100 mL IVPB  Status:  Discontinued        1 g 200 mL/hr over 30 Minutes Intravenous Every 8 hours 07/19/24 0438 07/22/24 1601   07/16/24 0900  ceFEPIme  (MAXIPIME ) 2 g in sodium chloride  0.9 % 100 mL IVPB  Status:  Discontinued        2 g 200 mL/hr over 30 Minutes Intravenous Every 8 hours 07/16/24 0811 07/19/24 0409   07/16/24 0815  ceFEPIme  (MAXIPIME ) 2 g in sodium chloride  0.9 % 100 mL IVPB  Status:  Discontinued        2 g 200 mL/hr over 30 Minutes Intravenous  Once 07/16/24 0803 07/16/24 0811   07/16/24 0330  cefTRIAXone  (ROCEPHIN ) 2 g in sodium chloride  0.9 % 100 mL IVPB        2 g 200 mL/hr over 30 Minutes Intravenous  Once 07/16/24 0315 07/16/24 0412       Subjective:  C/o pain - poorly localized - agrees to every location I ask her Daughter at bedside  Objective: Vitals:   08/10/24 0500 08/10/24 0800 08/10/24 0800 08/10/24 1124  BP:  (!) 104/49 (!) 104/49 (!) 162/71  Pulse:  64 64 99  Resp:  17 17   Temp:  99.2 F (37.3 C) 99.4 F (37.4 C) 98.9  F (37.2 C)  TempSrc:  Axillary Oral   SpO2:  100% 100% 100%  Weight: 83.5 kg     Height:        Intake/Output Summary (Last 24 hours) at 08/10/2024 1314 Last data filed at 08/10/2024 0800 Gross per 24 hour  Intake 375 ml  Output 1400 ml  Net -1025 ml   Filed Weights   08/08/24 0500 08/09/24 0500 08/10/24 0500  Weight: 81.6 kg 81.6 kg 83.5 kg    Examination:  General: No acute distress. Cardiovascular: RRR Lungs: unlabored Neurological: awake, delayed responses - takes multiple prompting occasionally to respond.  Extremities rigid, stable from 12/7.  Extremities: No clubbing or cyanosis. No edema.   Data Reviewed: I have personally reviewed following labs and imaging studies  CBC: Recent Labs  Lab 08/04/24 0555 08/05/24 0500 08/07/24 0600 08/09/24 1221 08/10/24 0405  WBC 6.8 8.3 10.0 11.4* 8.6  NEUTROABS  --   --   --   --  6.2  HGB 8.0* 9.5* 9.6* 10.1* 8.6*  HCT 24.4* 29.2*  29.9* 30.8* 27.1*  MCV 84.4 85.4 86.4 87.3 89.7  PLT 264 340 352 324 266    Basic Metabolic Panel: Recent Labs  Lab 08/04/24 0555 08/05/24 0500 08/07/24 0600 08/09/24 1221 08/10/24 0405  NA 134* 137 138 136 136  K 4.0 4.3 3.8 4.0 4.3  CL 98 96* 100 101 103  CO2 30 31 29 29 28   GLUCOSE 122* 133* 96 86 135*  BUN 15 15 18 15 15   CREATININE 0.41* 0.44 0.35* 0.49 0.37*  CALCIUM  8.3* 8.9 8.7* 8.5* 8.4*  MG  --   --   --   --  1.8  PHOS  --   --   --   --  3.8    GFR: Estimated Creatinine Clearance: 74 mL/min (Aragon Scarantino) (by C-G formula based on SCr of 0.37 mg/dL (L)).  Liver Function Tests: Recent Labs  Lab 08/09/24 1221 08/10/24 0405  AST 150* 103*  ALT 168* 176*  ALKPHOS 140* 193*  BILITOT 0.7 0.6  PROT 6.2* 5.4*  ALBUMIN 2.4* 2.1*    CBG: Recent Labs  Lab 08/09/24 1958 08/09/24 2340 08/10/24 0336 08/10/24 0829 08/10/24 1123  GLUCAP 138* 174* 147* 101* 118*     No results found for this or any previous visit (from the past 240 hours).       Radiology Studies: MR CERVICAL SPINE W WO CONTRAST Result Date: 08/09/2024 EXAM: MRI CERVICAL SPINE WITH AND WITHOUT CONTRAST 08/09/2024 07:04:28 PM TECHNIQUE: Multiplanar multisequence MRI of the cervical spine was performed without and with the administration of intravenous contrast. CONTRAST: 8 mL of Gadavist . COMPARISON: None available. CLINICAL HISTORY: Metastatic disease evaluation, myelopathy, acute, cervical spine. FINDINGS: BONES AND ALIGNMENT: Normal alignment. Normal vertebral body heights. No suspicious bone lesions. No abnormal enhancement. SPINAL CORD: Normal spinal cord size. Normal spinal cord signal. SOFT TISSUES: No paraspinal mass. C2-C3: No significant disc herniation. No spinal canal stenosis or neural foraminal narrowing. C3-C4: Left facet and uncovertebral hypertrophy. Mild left foraminal stenosis. Patent canal. C4-C5: No significant disc herniation. No spinal canal stenosis or neural foraminal narrowing.  C5-C6: Right facet and uncovertebral hypertrophy. Mild right foraminal stenosis. Patent canal. C6-C7: Right facet and uncovertebral hypertrophy. Mild right foraminal stenosis. Patent canal. C7-T1: No significant disc herniation. No spinal canal stenosis or neural foraminal narrowing. IMPRESSION: 1. No evidence of metastatic disease. 2. Mild foraminal stenosis on the left at C3-C4 and right at C5-C6 and C6-C7. Electronically signed by:  Gilmore Molt MD 08/09/2024 09:23 PM EST RP Workstation: [id number]   MR BRAIN W WO CONTRAST Result Date: 08/09/2024 EXAM: MRI BRAIN WITH AND WITHOUT CONTRAST 08/09/2024 07:04:11 PM TECHNIQUE: Multiplanar multisequence MRI of the head/brain was performed with and without the administration of intravenous contrast. CONTRAST: 8 mL of Gadavist . COMPARISON: MR Brain without Contrast 07/18/2024. CLINICAL HISTORY: Metastatic disease evaluation, neuro deficit, acute, stroke suspected. FINDINGS: BRAIN AND VENTRICLES: Multifocally evolving infarcts in the anterior and posterior circulation including small infarct in bilateral frontoparietal white matter and right occipital lobe with developing encephalomalacia and decreased restricted diffusion. No evidence of new/interval acute infarct. No acute intracranial hemorrhage. No mass effect or midline shift. No hydrocephalus. Normal flow voids. No mass or abnormal enhancement. ORBITS: No acute abnormality. SINUSES: No acute abnormality. BONES AND SOFT TISSUES: Normal bone marrow signal and enhancement. No acute soft tissue abnormality. IMPRESSION: 1. Evolving infarcts without new/interval acute abnormality. 2. No evidence of metastatic disease. Electronically signed by: Gilmore Molt MD 08/09/2024 08:50 PM EST RP Workstation: [id number]   CT CHEST ABDOMEN PELVIS W CONTRAST Result Date: 08/09/2024 EXAM: CT CHEST, ABDOMEN AND PELVIS WITH CONTRAST 08/09/2024 05:06:09 PM TECHNIQUE: CT of the chest, abdomen and pelvis was performed with the  administration of intravenous contrast. 75 mL of iohexol  (OMNIPAQUE ) 350 MG/ML injection was administered. Multiplanar reformatted images are provided for review. Automated exposure control, iterative reconstruction, and/or weight based adjustment of the mA/kV was utilized to reduce the radiation dose to as low as reasonably achievable. COMPARISON: CT abdomen and pelvis 06/19/2024. CLINICAL HISTORY: Metastatic disease evaluation. FINDINGS: CHEST: MEDIASTINUM AND LYMPH NODES: Heart and pericardium are unremarkable. The central airways are clear. No mediastinal, hilar or axillary lymphadenopathy. LUNGS AND PLEURA: No focal consolidation or pulmonary edema. No pleural effusion or pneumothorax. No pulmonary nodules. ABDOMEN AND PELVIS: LIVER: The liver is unremarkable. GALLBLADDER AND BILE DUCTS: Prior cholecystectomy. Slight intrahepatic and extrahepatic biliary ductal dilatation likely related to post-cholecystectomy state, stable since prior study. SPLEEN: Peripheral wedge-shaped low-density area posteriorly in the spleen, likely splenic infarct, decreased in size since prior study. PANCREAS: No acute abnormality. ADRENAL GLANDS: No acute abnormality. KIDNEYS, URETERS AND BLADDER: No stones in the kidneys or ureters. No hydronephrosis. No perinephric or periureteral stranding. Urinary bladder is unremarkable. GI AND BOWEL: Stomach demonstrates no acute abnormality. Feeding tube tip in the proximal stomach. There is no bowel obstruction. REPRODUCTIVE ORGANS: Prior hysterectomy. PERITONEUM AND RETROPERITONEUM: No ascites. No free air. VASCULATURE: Aorta is normal in caliber. Coronary artery and aortic atherosclerosis. Right port-Ivoree Felmlee-cath in place with the tip at the cavoatrial junction. ABDOMINAL AND PELVIS LYMPH NODES: No lymphadenopathy. BONES AND SOFT TISSUES: No acute osseous abnormality. Previously seen left psoas/paraspinal mass has decreased significantly in size, now difficult to visualize but measuring  approximately 2.8 x 1.9 cm, previously 10.2 x 8.5 x 7.5 cm. There is new abnormality/enlargement within the right psoas muscle with Quentin Shorey mass-like area measuring 5 x 4.6 cm on image 83. This could reflect tumor or hematoma. IMPRESSION: 1. New right psoas muscle mass-like enlargement measuring 5.0 x 4.6 cm, indeterminate for tumor versus hematoma; recommend correlation with clinical status and consider further evaluation with contrast-enhanced MRI or short-interval follow-up imaging for characterization and to exclude progression of metastatic disease. 2. Previously noted left psoas/paraspinal mass has decreased significantly in size, now approximately 2.8 x 1.9 cm from 10.2 x 8.5 x 7.5 cm, compatible with treatment response. 3. Coronary artery disease, aortic atherosclerosis. Electronically signed by: Franky Crease MD 08/09/2024 08:25 PM EST RP Workstation:  [id number]   EEG adult Result Date: 08/09/2024 Matthews Elida HERO, MD     08/10/2024  7:23 AM Routine EEG Report Yulieth Carrender is Jylian Pappalardo 63 y.o. female with Shenika Quint history of altered mental status who is undergoing an EEG to evaluate for seizures. Report: This EEG was acquired with electrodes placed according to the International 10-20 electrode system (including Fp1, Fp2, F3, F4, C3, C4, P3, P4, O1, O2, T3, T4, T5, T6, A1, A2, Fz, Cz, Pz). The following electrodes were missing or displaced: none. The occipital dominant rhythm was 5-6 Hz. This activity is reactive to stimulation. Drowsiness was manifested by background fragmentation; deeper stages of sleep were identified by K complexes and sleep spindles. There was no focal slowing. There were no interictal epileptiform discharges. There were no electrographic seizures identified. There was no abnormal response to photic stimulation or hyperventilation. Impression and clinical correlation: This EEG was obtained while awake and asleep and is abnormal due to moderate diffuse slowing indicative of global cerebral  dysfunction. Epileptiform abnormalities were not seen during this recording. Elida Matthews, MD [facility] [phone] If 7pm- 7am, please page neurology on call as listed in AMION.   CT HEAD WO CONTRAST ( ) Result Date: 08/09/2024 EXAM: CT HEAD WITHOUT CONTRAST 08/09/2024 05:18:12 PM TECHNIQUE: CT of the head was performed without the administration of intravenous contrast. Automated exposure control, iterative reconstruction, and/or weight based adjustment of the mA/kV was utilized to reduce the radiation dose to as low as reasonably achievable. COMPARISON: 07/18/2024 CLINICAL HISTORY: Neuro deficit, acute, stroke suspected FINDINGS: BRAIN AND VENTRICLES: No acute hemorrhage. No evidence of acute infarct. Remote right PCA territory infarct. Unchanged calcified meningioma along the left frontal convexity. Calcific atherosclerosis. No hydrocephalus. No extra-axial collection. No mass effect or midline shift. ORBITS: No acute abnormality. SINUSES: No acute abnormality. SOFT TISSUES AND SKULL: No acute soft tissue abnormality. No skull fracture. IMPRESSION: 1. No acute intracranial abnormality. 2. Remote right PCA territory infarct. 3. Unchanged calcified left frontal convexity meningioma. Electronically signed by: Lonni Necessary MD 08/09/2024 06:23 PM EST RP Workstation: [id number]        Scheduled Meds:  acetaminophen   500 mg Per Tube QID   atorvastatin   80 mg Per Tube QHS   Chlorhexidine  Gluconate Cloth  6 each Topical Daily   enoxaparin  (LOVENOX ) injection  1 mg/kg Subcutaneous Q12H   feeding supplement  237 mL Oral Q24H   feeding supplement (PROSource TF20)  60 mL Per Tube Daily   FLUoxetine   10 mg Per Tube Daily   free water   75 mL Per Tube Q4H   insulin  aspart  0-15 Units Subcutaneous Q4H   insulin  aspart  4 Units Subcutaneous Q4H   insulin  glargine  20 Units Subcutaneous BID   levETIRAcetam   500 mg Per Tube BID   lidocaine   1 patch Transdermal Daily   oxyCODONE   10  mg Oral QHS   propranolol   20 mg Per Tube TID   sodium chloride  flush  10-40 mL Intracatheter Q12H   Continuous Infusions:  feeding supplement (JEVITY 1.5 CAL/FIBER) 45 mL/hr at 08/05/24 1647     LOS: 24 days    Time spent: over 30 min     Meliton Monte, MD [facility]   To contact the attending provider between 7A-7P or the covering provider during after hours 7P-7A, please log into the web site [email or web address] and access using universal [place] password for that web site. If you do not have the password, please call the hospital operator.  08/10/2024, 1:14 PM

## 2024-08-10 NOTE — Plan of Care (Signed)
  Problem: Education: Goal: Ability to describe self-care measures that may prevent or decrease complications (Diabetes Survival Skills Education) will improve Outcome: Progressing   Problem: Nutritional: Goal: Maintenance of adequate nutrition will improve Outcome: Progressing   Problem: Tissue Perfusion: Goal: Adequacy of tissue perfusion will improve Outcome: Progressing

## 2024-08-10 NOTE — Procedures (Signed)
 Routine EEG Report  Brenda [name] is a 63 y.o. female with a history of altered mental status who is undergoing an EEG to evaluate for seizures.  Report: This EEG was acquired with electrodes placed according to the International 10-20 electrode system (including Fp1, Fp2, F3, F4, C3, C4, P3, P4, O1, O2, T3, T4, T5, T6, A1, A2, Fz, Cz, Pz). The following electrodes were missing or displaced: none.  The occipital dominant rhythm was 5-6 Hz. This activity is reactive to stimulation. Drowsiness was manifested by background fragmentation; deeper stages of sleep were identified by K complexes and sleep spindles. There was no focal slowing. There were no interictal epileptiform discharges. There were no electrographic seizures identified. There was no abnormal response to photic stimulation or hyperventilation.   Impression and clinical correlation: This EEG was obtained while awake and asleep and is abnormal due to moderate diffuse slowing indicative of global cerebral dysfunction. Epileptiform abnormalities were not seen during this recording.  [name], MD [facility] [phone]  If 7pm- 7am, please page neurology on call as listed in AMION.

## 2024-08-11 LAB — GLUCOSE, CAPILLARY
Glucose-Capillary: 105 mg/dL — ABNORMAL HIGH (ref 70–99)
Glucose-Capillary: 132 mg/dL — ABNORMAL HIGH (ref 70–99)
Glucose-Capillary: 139 mg/dL — ABNORMAL HIGH (ref 70–99)
Glucose-Capillary: 146 mg/dL — ABNORMAL HIGH (ref 70–99)
Glucose-Capillary: 161 mg/dL — ABNORMAL HIGH (ref 70–99)
Glucose-Capillary: 181 mg/dL — ABNORMAL HIGH (ref 70–99)
Glucose-Capillary: 61 mg/dL — ABNORMAL LOW (ref 70–99)
Glucose-Capillary: 90 mg/dL (ref 70–99)

## 2024-08-11 LAB — COMPREHENSIVE METABOLIC PANEL WITH GFR
ALT: 210 U/L — ABNORMAL HIGH (ref 0–44)
AST: 124 U/L — ABNORMAL HIGH (ref 15–41)
Albumin: 2.4 g/dL — ABNORMAL LOW (ref 3.5–5.0)
Alkaline Phosphatase: 258 U/L — ABNORMAL HIGH (ref 38–126)
Anion gap: 11 (ref 5–15)
BUN: 16 mg/dL (ref 8–23)
CO2: 25 mmol/L (ref 22–32)
Calcium: 8.5 mg/dL — ABNORMAL LOW (ref 8.9–10.3)
Chloride: 101 mmol/L (ref 98–111)
Creatinine, Ser: 0.66 mg/dL (ref 0.44–1.00)
GFR, Estimated: >60 mL/min (ref >60)
Glucose, Bld: 103 mg/dL — ABNORMAL HIGH (ref 70–99)
Potassium: 4.3 mmol/L (ref 3.5–5.1)
Sodium: 137 mmol/L (ref 135–145)
Total Bilirubin: 0.6 mg/dL (ref 0.0–1.2)
Total Protein: 6.2 g/dL — ABNORMAL LOW (ref 6.5–8.1)

## 2024-08-11 LAB — CBC WITH DIFFERENTIAL/PLATELET
Abs Immature Granulocytes: 0.05 K/uL (ref 0.00–0.07)
Basophils Absolute: 0 K/uL (ref 0.0–0.1)
Basophils Relative: 0 %
Eosinophils Absolute: 0.2 K/uL (ref 0.0–0.5)
Eosinophils Relative: 2 %
HCT: 30 % — ABNORMAL LOW (ref 36.0–46.0)
Hemoglobin: 9.6 g/dL — ABNORMAL LOW (ref 12.0–15.0)
Immature Granulocytes: 1 %
Lymphocytes Relative: 18 %
Lymphs Abs: 1.5 K/uL (ref 0.7–4.0)
MCH: 28.7 pg (ref 26.0–34.0)
MCHC: 32 g/dL (ref 30.0–36.0)
MCV: 89.8 fL (ref 80.0–100.0)
Monocytes Absolute: 0.5 K/uL (ref 0.1–1.0)
Monocytes Relative: 6 %
Neutro Abs: 6 K/uL (ref 1.7–7.7)
Neutrophils Relative %: 73 %
Platelets: 277 K/uL (ref 150–400)
RBC: 3.34 MIL/uL — ABNORMAL LOW (ref 3.87–5.11)
RDW: 19.4 % — ABNORMAL HIGH (ref 11.5–15.5)
Smear Review: NORMAL
WBC: 8.2 K/uL (ref 4.0–10.5)
nRBC: 0 % (ref 0.0–0.2)

## 2024-08-11 LAB — MAGNESIUM: Magnesium: 1.9 mg/dL (ref 1.7–2.4)

## 2024-08-11 LAB — PHOSPHORUS: Phosphorus: 3.8 mg/dL (ref 2.5–4.6)

## 2024-08-11 MED ORDER — DEXTROSE 50 % IV SOLN
1.0000 | Freq: Once | INTRAVENOUS | Status: AC
  Administered 2024-08-11: 50 mL via INTRAVENOUS
  Filled 2024-08-11: qty 50

## 2024-08-11 MED ORDER — INSULIN GLARGINE 100 UNIT/ML ~~LOC~~ SOLN
15.0000 [IU] | Freq: Two times a day (BID) | SUBCUTANEOUS | Status: DC
  Administered 2024-08-11 – 2024-08-12 (×3): 15 [IU] via SUBCUTANEOUS
  Filled 2024-08-11 (×5): qty 0.15

## 2024-08-11 MED ORDER — INSULIN ASPART 100 UNIT/ML IJ SOLN
0.0000 [IU] | INTRAMUSCULAR | Status: DC
  Administered 2024-08-11: 1 [IU] via SUBCUTANEOUS
  Administered 2024-08-11 (×2): 2 [IU] via SUBCUTANEOUS
  Administered 2024-08-11: 1 [IU] via SUBCUTANEOUS
  Administered 2024-08-12: 5 [IU] via SUBCUTANEOUS
  Filled 2024-08-11: qty 2
  Filled 2024-08-11: qty 1
  Filled 2024-08-11: qty 5
  Filled 2024-08-11: qty 1
  Filled 2024-08-11: qty 2

## 2024-08-11 MED ORDER — PANTOPRAZOLE SODIUM 40 MG IV SOLR
40.0000 mg | INTRAVENOUS | Status: AC
  Administered 2024-08-11 – 2024-08-13 (×3): 40 mg via INTRAVENOUS
  Filled 2024-08-11 (×3): qty 10

## 2024-08-11 MED ORDER — SODIUM CHLORIDE 0.9 % IV SOLN
1000.0000 mg | INTRAVENOUS | Status: AC
  Administered 2024-08-11 – 2024-08-13 (×3): 1000 mg via INTRAVENOUS
  Filled 2024-08-11: qty 16
  Filled 2024-08-11: qty 8
  Filled 2024-08-11 (×2): qty 16

## 2024-08-11 NOTE — Progress Notes (Signed)
 Daily Progress Note   Date: 08/11/2024   Patient Name: Brenda [name]  DOB: [birth year]  MRN: [id number]  Age / Sex: 63 y.o., female  Attending Physician: Perri DELENA Meliton Mickey., * Primary Care Physician: [facility] Admit Date: 07/15/2024 Length of Stay: 25 days  Reason for Follow-up: Establishing goals of care  Past Medical History:  Diagnosis Date   Anemia    Arthritis    back, hands, hips (02/22/2015)   CAD (coronary artery disease)    a. complex LAD/diagonal bifurcation PCI in 2010. b. STEMI 10/2019 s/p PTCA/DES x1 to mLAD overlapping the old stent, residual disease treated medicaly.   Cancer [facility])    uterine   CHF (congestive heart failure) (HCC)    Depression    Diabetes mellitus (HCC)    started when I was pregnant; not sure if it was type 1 or type 2    History of radiation therapy    Endometrium- HDR 01/22/22-03/07/22- Dr. Lynwood Nasuti   Hypercholesterolemia    Hypertension    Morbid obesity (HCC)    Myocardial infarction (HCC)    mild x 3   Neuromuscular disorder (HCC)    neuropathy feet   Sleep apnea    cpap/    Assessment & Plan:   HPI/Patient Profile:   63 y.o. 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   Palliative medicine consulted for goals of care conversation.  SUMMARY OF RECOMMENDATIONS  DNR-limited Continue efforts to improve mentation defer to neurology for further work up and discussion with family Symptom management as below  Symptom Management:  Oxycodone  10 mg daily at bedtime for pain Oxycodone  2.5 mg Q3 PRN for pain  Code Status: DNR - Limited (DNR/DNI)  Prognosis: Unable to determine  Discharge Planning: To Be Determined  Discussed with: Perri MD, Voncile MD, and Sherill MD about family's desire to continue to aggressive work up  for poor mentation.   Subjective:   Subjective: Chart Reviewed. Updates received. Patient Assessed. Created space and opportunity for patient  and family to explore thoughts and feelings regarding current medical situation.  Reviewed oncology note 08/11/2024 by Sherrill MD -CT findings reviewed with [facility] which showed significant improvement of the left retroperitoneum malignancy but the new mass on the right psoas muscle is unclear.   Today's Discussion:  - Met with daughter Brenda [name] today and inquired about conversation with her father about possibility of pursuing comfort measures, daughter shares that in light of the new CT scan which showed decrease in size of nodules on lungs as well as paraspinal tumors, she does not think that family is going to pursue hospice or comfort measures at this point - Family would like to continue aggressive workup of poor mentation at this time, they would like to continue to talk with neurology about their plans, and shared with family that I will contact neurology to have them speak to the family again about treatment options going forward to help improve mentation - Family remains hopeful now that the patient can have an improvement in her mentation so that she can pursue chemotherapy - Discussed with family about pain management, they were happy with the current regimen including oxycodone  and morphine , daughter shares that the morphine  has been more helpful than the as needed oxycodone  since it allows the patient to sleep all night  Review of Systems  Unable to perform ROS   Objective:   Primary Diagnoses: Present on Admission:  UTI (urinary tract infection)  Chronic hypoxic respiratory failure (HCC)  Anxiety with depression  CAD (coronary artery disease)  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     Comments: Obtunded  HENT:     Head: Normocephalic.     Nose: Nose normal.     Mouth/Throat:     Mouth: Mucous membranes are dry.  Cardiovascular:     Rate and Rhythm: Normal rate.     Pulses: Normal pulses.  Pulmonary:     Effort: Pulmonary effort is normal.     Palliative Assessment/Data: 20%   Existing Vynca/ACP Documentation: None  Thank you for allowing us  to participate in the care of Nariya Neumeyer PMT will continue to support holistically.  I personally spent a total of 35 minutes in the care of the patient today including preparing to see the patient, getting/reviewing separately obtained history, counseling and educating, referring and communicating with other health care professionals, and documenting clinical information in the EHR.  [name]  Palliative Medicine Team  Team Phone # [phone] (Nights/Weekends) 08/11/2024 10:04 AM

## 2024-08-11 NOTE — Progress Notes (Signed)
 Physical Therapy Treatment Patient Details Name: Brenda [name] MRN: [id number] DOB: 1960/10/19 Today's Date: 08/11/2024   History of Present Illness 63 yo female who returned to hospital on 07/15/2024 with ongoing signs and symptoms of UTI with generalized weakness. Pt with 4 seizures on EEG 11/16. Working diagnosis of cefepime  neurotoxicity. Prior admit 11/9 with urinary symptoms and AMS d/c home on OP ABX. PMH includes but is not limited to: HTN, HLD, DM, depression/anxiety, CVA (CIR recently), DVT/PE, OSA on CPAP, chronic respiratory failure on 2 L/min at baseline, and metastatic endometrial cancer with chronic cancer-related pain.    PT Comments  Patient new to this therapist. Discussed PT trajectory with prior PT Susette Daring). Main goal at this time is to educate family on PROM of extremities. Patient noted to be in supine with head rotated and laterally flexed to rt (with PROM/stretching could achieve midline), UEs propped on pillows with stuffed animal holding Rt hand open, LLE in ER, slight abdct, and slight flexion, and RLE in neutral. Performed primarily PROM to all extremities (AAROM of shoulders, no other active movement noted). For LEs, the following ROM deficits noted: LLE- hip IR to neutral, flexion to 40; knee 0-45 flexion; ankle -45 dorsiflexion (I.e. in plantarflexion) RLE-hip IR/ER 10 degrees, flexion 20 degrees with calling out oh no and reported pain, knee flexion 0-20 (primarily limited by position and hip pain), ankle -45 dorsiflexion.   Husband states his daughters primarily do the PROM/stretching with patient due to his bad back and RUE weakness. Asked what he could do for patient and educated that UE ROM was easier as she has more ROM and assists at shoulder. Recommend continue placing rolled washcloth or stuffed animal in each hand for skin integrity. Also cervical rotation would be something he can do.   Physical therapy will plan to see patient when daughter  present to assure that she feels comfortable doing PROM. Anticipate discharge from PT once this family education goal is met.     If plan is discharge home, recommend the following: Two people to help with walking and/or transfers;Two people to help with bathing/dressing/bathroom;Assistance with feeding;Direct supervision/assist for medications management;Direct supervision/assist for financial management;Assist for transportation;Supervision due to cognitive status   Can travel by private vehicle     No  Equipment Recommendations  Hospital bed;Hoyer lift;Other (comment) Visual Merchandiser)    Recommendations for Other Services       Precautions / Restrictions Precautions Precautions: Fall;Other (comment) Recall of Precautions/Restrictions: Impaired Precaution/Restrictions Comments: Cortrak, skin integrity Restrictions Weight Bearing Restrictions Per Provider Order: No     Mobility  Bed Mobility                    Transfers                        Ambulation/Gait                   Stairs             Wheelchair Mobility     Tilt Bed    Modified Rankin (Stroke Patients Only)       Balance                                            Communication Communication Communication: Impaired Factors Affecting Communication: Difficulty expressing self  Cognition Arousal:  Alert (but keeps eyes closed) Behavior During Therapy: Flat affect   PT - Cognitive impairments: Difficult to assess                       PT - Cognition Comments: Stated her name; responded to open ended questions with it seems so; stated yes that ROM is painful and no she doesn't want PT to stop and yes it feels better afterwards Following commands: Impaired Following commands impaired: Follows one step commands inconsistently, Follows one step commands with increased time (< 10% of the time)    Cueing Cueing Techniques: Verbal cues,  Tactile cues  Exercises General Exercises - Upper Extremity Shoulder Flexion: Both, Supine, AAROM, 5 reps Elbow Flexion: PROM, Both, Supine, 5 reps Elbow Extension: PROM, Both, Supine, 5 reps Wrist Flexion: PROM, Both, Supine, 5 reps Wrist Extension: PROM, Both, Supine, 5 reps Digit Composite Flexion: PROM, Both, Supine, 5 reps Composite Extension: PROM, Both, Supine, 5 reps General Exercises - Lower Extremity Ankle Circles/Pumps: PROM, Both, 10 reps, Supine Heel Slides: PROM, Both, Supine, 5 reps Other Exercises Other Exercises: Hip IR; 10 reps; PROM; supine Other Exercises: L cervical rotation; supine; PROM; 10 sec hold x2 Other Exercises: L cervical lateral flexion; 10 sec hold x 2; PROM    General Comments General comments (skin integrity, edema, etc.): Husband present. States daughters do most of PROM with patient as he has a bad back and weak RUE. Asked what things he can do with her and encouraged Shoulder AAROM and hand/finger PROM with continued use of washcloth vs stuffed animals in her hands to maintain skin integrity.      Pertinent Vitals/Pain Pain Assessment Pain Assessment: Faces Faces Pain Scale: No hurt Breathing: normal Negative Vocalization: none Facial Expression: smiling or inexpressive Body Language: relaxed Consolability: no need to console PAINAD Score: 0 Pain Location: generalized; esp RLE with hip/knee flexion Pain Descriptors / Indicators: Moaning Pain Intervention(s): Limited activity within patient's tolerance, Monitored during session, Premedicated before session    Home Living                          Prior Function            PT Goals (current goals can now be found in the care plan section) Acute Rehab PT Goals Patient Stated Goal: pt spouse would like her to be able to drink/eat Time For Goal Achievement: 08/18/24 Potential to Achieve Goals: Fair Progress towards PT goals: Not progressing toward goals - comment     Frequency    Min 1X/week      PT Plan      Co-evaluation              AM-PAC PT 6 Clicks Mobility   Outcome Measure  Help needed turning from your back to your side while in a flat bed without using bedrails?: Total Help needed moving from lying on your back to sitting on the side of a flat bed without using bedrails?: Total Help needed moving to and from a bed to a chair (including a wheelchair)?: Total Help needed standing up from a chair using your arms (e.g., wheelchair or bedside chair)?: Total Help needed to walk in hospital room?: Total Help needed climbing 3-5 steps with a railing? : Total 6 Click Score: 6    End of Session Equipment Utilized During Treatment: Oxygen  Activity Tolerance: Patient limited by pain Patient left: in bed;with call bell/phone within reach;with  family/visitor present   PT Visit Diagnosis: Other abnormalities of gait and mobility (R26.89);Muscle weakness (generalized) (M62.81);Unsteadiness on feet (R26.81);History of falling (Z91.81)     Time: 8549-8480 PT Time Calculation (min) (ACUTE ONLY): 29 min  Charges:    $Therapeutic Exercise: 23-37 mins PT General Charges $$ ACUTE PT VISIT: 1 Visit                      Macario RAMAN, PT Acute Rehabilitation Services  Office [phone]    Macario SHAUNNA Soja 08/11/2024, 3:36 PM

## 2024-08-11 NOTE — Progress Notes (Signed)
 NEUROLOGY CONSULT FOLLOW UP NOTE   Date of service: August 11, 2024 Patient Name: Brenda [name] MRN:  [id number] DOB:  1961-08-23  Interval Hx/subjective  Patient seen and examined. Daughter at bedside Reports she still has a lot of stiffness and her mentation is also off.  Vitals   Vitals:   08/11/24 0000 08/11/24 0355 08/11/24 0500 08/11/24 0750  BP: (!) 112/55 (!) 165/58  (!) 146/32  Pulse: (!) 59 77  99  Resp: 18 18  16   Temp: 98.4 F (36.9 C) 97.7 F (36.5 C)  97.9 F (36.6 C)  TempSrc: Oral Oral  Oral  SpO2: 100% 100%  100%  Weight:   84.4 kg   Height:         Body mass index is 34.02 kg/m.  Physical Exam   General: Awake, alert and oriented to self. HEENT: Alopecia and facial hirsutism noted CVS: Regular rhythm Abdomen nondistended nontender Neurological exam She was awake and alert.  She was oriented to self.  Upon asking her age, she would only repeat her date of birth.  She was able to follow simple commands. Cranial nerves: Pupils equal round react light, extraocular movements appear intact, visual fields are full, face appears symmetric. Motor exam reveals increased tone in all 4 extremities with paratonia and pain on passive movement of all 4 extremities. Sensation appears intact to light touch Could not elicit any reflexes   Medications  Current Facility-Administered Medications:    artificial tears (LACRILUBE) ophthalmic ointment, , Both Eyes, Q4H PRN, Cosette Blackwater, MD, Given at 08/10/24 1520   atorvastatin  (LIPITOR ) tablet 80 mg, 80 mg, Per Tube, QHS, Sigdel, Santosh, MD, 80 mg at 08/10/24 2206   baclofen  (LIORESAL ) tablet 5 mg, 5 mg, Per Tube, BID, Chen, Lydia D, RPH, 5 mg at 08/10/24 2206   Chlorhexidine  Gluconate Cloth 2 % PADS 6 each, 6 each, Topical, Daily, Chavez, Abigail, NP, 6 each at 08/10/24 9075   enoxaparin  (LOVENOX ) injection 80 mg, 1 mg/kg, Subcutaneous, Q12H, Tariq, Hassan, MD, 80 mg at 08/11/24 0830   feeding supplement  (ENSURE PLUS HIGH PROTEIN) liquid 237 mL, 237 mL, Oral, Q24H, Drusilla, Gagan S, MD, 237 mL at 08/10/24 1743   feeding supplement (JEVITY 1.5 CAL/FIBER) liquid 1,000 mL, 1,000 mL, Per Tube, Continuous, Cosette Blackwater, MD, Last Rate: 45 mL/hr at 08/10/24 1525, 1,000 mL at 08/10/24 1525   feeding supplement (PROSource TF20) liquid 60 mL, 60 mL, Per Tube, Daily, Sigdel, Santosh, MD, 60 mL at 08/10/24 9078   fentaNYL  (SUBLIMAZE ) injection 12.5 mcg, 12.5 mcg, Intravenous, Q4H PRN, Ko, Joseph E, PA-C, 12.5 mcg at 08/09/24 0629   free water  75 mL, 75 mL, Per Tube, Q4H, Cosette Blackwater, MD, 75 mL at 08/11/24 0831   insulin  aspart (novoLOG ) injection 0-9 Units, 0-9 Units, Subcutaneous, Q4H, Powell, A Meliton Raddle., MD   insulin  glargine (LANTUS ) injection 15 Units, 15 Units, Subcutaneous, BID, Perri DELENA Meliton Raddle., MD   levETIRAcetam  (KEPPRA ) tablet 500 mg, 500 mg, Per Tube, BID, Chen, Lydia D, RPH, 500 mg at 08/10/24 2206   lidocaine  (LIDODERM ) 5 % 1 patch, 1 patch, Transdermal, Daily, Alekh, Kshitiz, MD, 1 patch at 08/10/24 0916   loperamide  HCl (IMODIUM ) 1 MG/7.5ML suspension 2 mg, 2 mg, Per Tube, PRN, Merilee Linsey I, RPH, 2 mg at 08/05/24 9178   morphine  (PF) 2 MG/ML injection 2 mg, 2 mg, Intravenous, Q2H PRN, 2 mg at 08/11/24 0555 **OR** morphine  (PF) 2 MG/ML injection 4 mg, 4 mg, Intravenous, Q2H PRN,  Perri DELENA Meliton Mickey., MD, 4 mg at 08/10/24 1201   naloxone  (NARCAN ) injection 0.4 mg, 0.4 mg, Intravenous, PRN, Alfornia Madison, MD   ondansetron  (ZOFRAN ) tablet 4 mg, 4 mg, Per Tube, Q6H PRN **OR** ondansetron  (ZOFRAN ) injection 4 mg, 4 mg, Intravenous, Q6H PRN, Cosette Blackwater, MD   oxyCODONE  (Oxy IR/ROXICODONE ) immediate release tablet 10 mg, 10 mg, Oral, QHS, Cooper, Josseline P, PA-C, 10 mg at 08/10/24 2206   oxyCODONE  (Oxy IR/ROXICODONE ) immediate release tablet 2.5 mg, 2.5 mg, Per Tube, Q3H PRN, Cooper, Josseline P, PA-C, 2.5 mg at 08/10/24 0656   prochlorperazine  (COMPAZINE ) tablet 10 mg, 10 mg,  Oral, Q6H PRN, Foust, Katy L, NP   propranolol  (INDERAL ) tablet 20 mg, 20 mg, Per Tube, TID, Sigdel, Santosh, MD, 20 mg at 08/10/24 2206   senna-docusate (Senokot-S) tablet 1 tablet, 1 tablet, Oral, QHS PRN, Foust, Katy L, NP   sodium chloride  flush (NS) 0.9 % injection 10-40 mL, 10-40 mL, Intracatheter, Q12H, Sigdel, Santosh, MD, 10 mL at 08/10/24 2207   sodium chloride  flush (NS) 0.9 % injection 10-40 mL, 10-40 mL, Intracatheter, PRN, Mcarthur Pick, MD  Labs and Diagnostic Imaging   CBC:  Recent Labs  Lab 08/10/24 0405 08/11/24 0540  WBC 8.6 8.2  NEUTROABS 6.2 6.0  HGB 8.6* 9.6*  HCT 27.1* 30.0*  MCV 89.7 89.8  PLT 266 277    Basic Metabolic Panel:  Lab Results  Component Value Date   NA 137 08/11/2024   K 4.3 08/11/2024   CO2 25 08/11/2024   GLUCOSE 103 (H) 08/11/2024   BUN 16 08/11/2024   CREATININE 0.66 08/11/2024   CALCIUM  8.5 (L) 08/11/2024   GFRNONAA >60 08/11/2024   GFRAA 94 12/16/2019   Lipid Panel:  Lab Results  Component Value Date   LDLCALC 28 05/30/2024   HgbA1c:  Lab Results  Component Value Date   HGBA1C 7.2 (H) 06/01/2024   Urine Drug Screen:     Component Value Date/Time   LABOPIA POSITIVE (A) 04/20/2024 1817   COCAINSCRNUR NONE DETECTED 04/20/2024 1817   LABBENZ NONE DETECTED 04/20/2024 1817   AMPHETMU NONE DETECTED 04/20/2024 1817   THCU NONE DETECTED 04/20/2024 1817   LABBARB NONE DETECTED 04/20/2024 1817    Alcohol Level     Component Value Date/Time   ETH <15 04/20/2024 1344   INR  Lab Results  Component Value Date   INR 1.0 07/16/2024   APTT  Lab Results  Component Value Date   APTT 67 (H) 06/21/2024   Imaging personally reviewed CT Head without contrast(Personally reviewed): 1. Chronic encephalomalacia changes in the right medial temporal and occipital lobes developed in the interim. 2. Unchanged small calcified meningioma along left frontal convexity.   MRI Brain(Personally reviewed): 1. Similar evolving infarcts  in the anterior and posterior circulation. 2. No new infarcts, progressive mass effect or acute hemorrhage.   Repeat MRI Brain 12/7 1. Evolving infarcts without new/interval acute abnormality. 2. No evidence of metastatic disease.   MRI C spine 12/7 1. No evidence of metastatic disease. 2. Mild foraminal stenosis on the left at C3-C4 and right at C5-C6 and C6-C7.  Assessment   Brenda [name] is a 63 y.o. female past history of CAD, DM, obesity, OSA, metastatic endometrial cancer, prior stroke, DVT, PE on anticoagulation, recurrent UTIs with concern for cefepime  toxicity causing seizures for which she which she was started on Keppra  and cefepime  was discontinued, continues to have neurological worsening despite cessation of seizures and no new strokes on  imaging.  Cervical spine imaging also unremarkable for acute process. At this time, stiffness on her exam and altered mental status has an unclear etiology but differentials include serotonin syndrome due to being on Prozac  versus side effect from the PD-1 inhibitor pembrolizumab , which is being used to treat her cancer.  MRI is negative for acute strokes-she has had old strokes that are now resolving. Her CSF results do not favor acute inflammatory demyelinating polyneuropathy/Guillain-Barr type of pathology.  PD-1 inhibitor induced myasthenia and myositis remain in the differentials but with a nearly normal CK, myositis is less likely.  PD-1 inhibitor induced myasthenia still remains in the differential along with serotonin syndrome. She was only on a very low-dose of Prozac  which makes me think that serotonin syndrome may not be the true culprit here. Treatment of the PD-1 antibody induced side effects-steroids are first-line followed by IVIG and plasma exchange. There was discussion of using Sinemet for the rigidity but I am not sure if the rigidity is truly from a parkinsonian etiology, so I am hesitant to introduce that medication to the  mix.     Recommendations  Consider high-dose IV steroids Discussed this with the husband over the phone-she is coming to the hospital this afternoon and would like an in person conversation.  I will be available for that once he arrives. Have discussed this plan with the hospitalist and the oncologist via secure chat.  Have also discussed this with the PMT APP over secure chat ______________________________________________________________________   Signed, Eligio Lav, MD [facility]

## 2024-08-11 NOTE — Plan of Care (Signed)
 Spoke with the husband in detail Agreeable to steroids. Concerned about sugar levels-would like close monitoring of sugar levels Relayed the plan to the hospitalist Would start Solu-Medrol  IV 1000 mg daily for 3 days

## 2024-08-11 NOTE — Progress Notes (Signed)
 Nutrition Follow-up  DOCUMENTATION CODES:  Non-severe (moderate) malnutrition in context of chronic illness  INTERVENTION:  Continue the following tube feeding via cortrak: Jevity 1.5 at 45 ml/h (1080 ml per day) Prosource TF20 60 ml 1x/d Free water : 75mL every 4 hours per MD Provides 1700 kcal, 88 gm protein, 821 ml free water  daily ( fee water  TF+flush) Continue current diet as ordered per SLP, encourage PO intake Monitor GOC discussions Ensure Plus High Protein po 1x/d, each supplement provides 350 kcal and 20 grams of protein.   NUTRITION DIAGNOSIS:  Moderate Malnutrition related to chronic illness (cancer) as evidenced by percent weight loss, mild muscle depletion, moderate muscle depletion (12% x 6 months). - remains applicable  GOAL:  Patient will meet greater than or equal to 90% of their needs - being addressed with tube feeds  MONITOR:  Weight trends, TF tolerance, I & O's, Labs, Diet advancement  REASON FOR ASSESSMENT:  Consult Enteral/tube feeding initiation and management  ASSESSMENT:  Pt with hx of HTN, HLD, CAD, CHF, hx CVA, uterine cancer s/p hysterectomy and adjuvant radiation therapy in 2023 with recurrance with mets in July 2025, hx MI x 3, and DM type 1 presented to [name] ED with weakness after a recent admission from 11/9-11/11. Pt had seizures during admission and was transferred to [facility] for continuous EEG.  11/13 - presented to [facility] with chills and weakness after recent admission to [facility] 11/15 - transferred to [facility] 11/19 - cortrak placed (gastric) 12/2 - SLP BSE, NPO 12/4 - MBS, DYS1, thin liquids  Pt resting in bed with eyes closed at the time of assessment. Daughter at bedside. No issues with TF. Noted that pt has had weight gain since admission. Some may be related to edema as pt has pitting edema to the hands/arms and generalized around her face and some may be related to pt being in an air mattress bed  now.Otherwise seems to be tolerating TF well.   Meal trays at bedside untouched. Daughter reports that pt is not interested. Also sleepy today. Neurology still consulting for stiff extremities.  Daughter has no nutrition questions at this time. Will continue to follow and monitor longterm nutrition plan.   Admit weight: 77.9 kg Current weight: 84.4 kg   12% weight loss noted in the last 6 months (since cancer recurrence) which is severe for timeframe  Nutritionally Relevant Medications: Scheduled Meds:  atorvastatin   80 mg Per Tube QHS   baclofen   5 mg Per Tube BID   Ensure Plus High Protein  237 mL Oral Q24H   PROSource TF20  60 mL Per Tube Daily   free water   75 mL Per Tube Q4H   insulin  aspart  0-15 Units Subcutaneous Q4H   insulin  aspart  4 Units Subcutaneous Q4H   insulin  glargine  20 Units Subcutaneous BID   Continuous Infusions:  feeding supplement (JEVITY 1.5 CAL/FIBER) 1,000 mL (08/10/24 1525)   PRN Meds: loperamide  HCl, ondansetron , senna-docusate  Labs Reviewed: CBG ranges from 94-176 mg/dL over the last 24 hours HgbA1c 7.2% (06/01/24)  NUTRITION - FOCUSED PHYSICAL EXAM: Flowsheet Row Most Recent Value  Orbital Region No depletion  Upper Arm Region No depletion  Thoracic and Lumbar Region No depletion  Buccal Region No depletion  Temple Region Mild depletion  Clavicle Bone Region Mild depletion  Clavicle and Acromion Bone Region Mild depletion  Scapular Bone Region Mild depletion  Dorsal Hand No depletion  Patellar Region Moderate depletion  Anterior  Thigh Region Moderate depletion  Posterior Calf Region Moderate depletion  Edema (RD Assessment) Moderate  [BLE, BUE]  Hair Reviewed  Eyes Reviewed  Mouth Reviewed  Skin Reviewed  Nails Reviewed    Diet Order:   Diet Order             DIET - DYS 1 Room service appropriate? Yes with Assist; Fluid consistency: Thin  Diet effective now                   EDUCATION NEEDS:  Education needs have been  addressed  Skin:  Skin Assessment: Reviewed RN Assessment DTI: - Sacrum (0.3x0.3x0.1 cm) Stage 2:  - medial vertebral column  Last BM:  12/7 - type 6  Height:  Ht Readings from Last 1 Encounters:  07/16/24 5' 2 (1.575 m)    Weight:  Wt Readings from Last 1 Encounters:  08/11/24 84.4 kg    Ideal Body Weight:  50 kg  BMI:  Body mass index is 34.02 kg/m.  Estimated Nutritional Needs:  Kcal:  1600-1800 kcal/d Protein:  75-90 g/d Fluid:  >/=1.6L/d    Brenda [name], RD, LDN, CNSC Registered Dietitian II Please reach out via secure chat

## 2024-08-11 NOTE — Progress Notes (Signed)
 IP PROGRESS NOTE  Subjective:   Brenda [name] reports improvement in pain.  Her daughter was at the bedside when I saw her at approximately 6:45 AM.  She has limited movement.  No new complaint.       Objective: Vital signs in last 24 hours: Blood pressure (!) 154/55, pulse 100, temperature 98.2 F (36.8 C), temperature source Oral, resp. rate 15, height 5' 2 (1.575 m), weight 186 lb (84.4 kg), last menstrual period 10/30/2014, SpO2 100%.  Intake/Output from previous day: 12/08 0701 - 12/09 0700 In: 450 [NG/GT:450] Out: 950 [Urine:950]  Physical Exam:  Neurologic: Alert, has fluent speech, weak bilateral grip strength, she can hold her arm against gravity.  Minimal toe movement.  Sensation intact to pain in the extremities  Portacath/PICC-without erythema  Lab Results: Recent Labs    08/10/24 0405 08/11/24 0540  WBC 8.6 8.2  HGB 8.6* 9.6*  HCT 27.1* 30.0*  PLT 266 277    BMET Recent Labs    08/10/24 0405 08/11/24 0540  NA 136 137  K 4.3 4.3  CL 103 101  CO2 28 25  GLUCOSE 135* 103*  BUN 15 16  CREATININE 0.37* 0.66  CALCIUM  8.4* 8.5*    No results found for: CEA1, CEA, CAN199, CA125  Studies/Results: MR CERVICAL SPINE W WO CONTRAST Result Date: 08/09/2024 EXAM: MRI CERVICAL SPINE WITH AND WITHOUT CONTRAST 08/09/2024 07:04:28 PM TECHNIQUE: Multiplanar multisequence MRI of the cervical spine was performed without and with the administration of intravenous contrast. CONTRAST: 8 mL of Gadavist . COMPARISON: None available. CLINICAL HISTORY: Metastatic disease evaluation, myelopathy, acute, cervical spine. FINDINGS: BONES AND ALIGNMENT: Normal alignment. Normal vertebral body heights. No suspicious bone lesions. No abnormal enhancement. SPINAL CORD: Normal spinal cord size. Normal spinal cord signal. SOFT TISSUES: No paraspinal mass. C2-C3: No significant disc herniation. No spinal canal stenosis or neural foraminal narrowing. C3-C4: Left facet and  uncovertebral hypertrophy. Mild left foraminal stenosis. Patent canal. C4-C5: No significant disc herniation. No spinal canal stenosis or neural foraminal narrowing. C5-C6: Right facet and uncovertebral hypertrophy. Mild right foraminal stenosis. Patent canal. C6-C7: Right facet and uncovertebral hypertrophy. Mild right foraminal stenosis. Patent canal. C7-T1: No significant disc herniation. No spinal canal stenosis or neural foraminal narrowing. IMPRESSION: 1. No evidence of metastatic disease. 2. Mild foraminal stenosis on the left at C3-C4 and right at C5-C6 and C6-C7. Electronically signed by: Gilmore Molt MD 08/09/2024 09:23 PM EST RP Workstation: [id number]   MR BRAIN W WO CONTRAST Result Date: 08/09/2024 EXAM: MRI BRAIN WITH AND WITHOUT CONTRAST 08/09/2024 07:04:11 PM TECHNIQUE: Multiplanar multisequence MRI of the head/brain was performed with and without the administration of intravenous contrast. CONTRAST: 8 mL of Gadavist . COMPARISON: MR Brain without Contrast 07/18/2024. CLINICAL HISTORY: Metastatic disease evaluation, neuro deficit, acute, stroke suspected. FINDINGS: BRAIN AND VENTRICLES: Multifocally evolving infarcts in the anterior and posterior circulation including small infarct in bilateral frontoparietal white matter and right occipital lobe with developing encephalomalacia and decreased restricted diffusion. No evidence of new/interval acute infarct. No acute intracranial hemorrhage. No mass effect or midline shift. No hydrocephalus. Normal flow voids. No mass or abnormal enhancement. ORBITS: No acute abnormality. SINUSES: No acute abnormality. BONES AND SOFT TISSUES: Normal bone marrow signal and enhancement. No acute soft tissue abnormality. IMPRESSION: 1. Evolving infarcts without new/interval acute abnormality. 2. No evidence of metastatic disease. Electronically signed by: Gilmore Molt MD 08/09/2024 08:50 PM EST RP Workstation: [id number]   CT CHEST ABDOMEN PELVIS W  CONTRAST Result Date: 08/09/2024 EXAM: CT CHEST, ABDOMEN  AND PELVIS WITH CONTRAST 08/09/2024 05:06:09 PM TECHNIQUE: CT of the chest, abdomen and pelvis was performed with the administration of intravenous contrast. 75 mL of iohexol  (OMNIPAQUE ) 350 MG/ML injection was administered. Multiplanar reformatted images are provided for review. Automated exposure control, iterative reconstruction, and/or weight based adjustment of the mA/kV was utilized to reduce the radiation dose to as low as reasonably achievable. COMPARISON: CT abdomen and pelvis 06/19/2024. CLINICAL HISTORY: Metastatic disease evaluation. FINDINGS: CHEST: MEDIASTINUM AND LYMPH NODES: Heart and pericardium are unremarkable. The central airways are clear. No mediastinal, hilar or axillary lymphadenopathy. LUNGS AND PLEURA: No focal consolidation or pulmonary edema. No pleural effusion or pneumothorax. No pulmonary nodules. ABDOMEN AND PELVIS: LIVER: The liver is unremarkable. GALLBLADDER AND BILE DUCTS: Prior cholecystectomy. Slight intrahepatic and extrahepatic biliary ductal dilatation likely related to post-cholecystectomy state, stable since prior study. SPLEEN: Peripheral wedge-shaped low-density area posteriorly in the spleen, likely splenic infarct, decreased in size since prior study. PANCREAS: No acute abnormality. ADRENAL GLANDS: No acute abnormality. KIDNEYS, URETERS AND BLADDER: No stones in the kidneys or ureters. No hydronephrosis. No perinephric or periureteral stranding. Urinary bladder is unremarkable. GI AND BOWEL: Stomach demonstrates no acute abnormality. Feeding tube tip in the proximal stomach. There is no bowel obstruction. REPRODUCTIVE ORGANS: Prior hysterectomy. PERITONEUM AND RETROPERITONEUM: No ascites. No free air. VASCULATURE: Aorta is normal in caliber. Coronary artery and aortic atherosclerosis. Right port-a-cath in place with the tip at the cavoatrial junction. ABDOMINAL AND PELVIS LYMPH NODES: No lymphadenopathy. BONES  AND SOFT TISSUES: No acute osseous abnormality. Previously seen left psoas/paraspinal mass has decreased significantly in size, now difficult to visualize but measuring approximately 2.8 x 1.9 cm, previously 10.2 x 8.5 x 7.5 cm. There is new abnormality/enlargement within the right psoas muscle with a mass-like area measuring 5 x 4.6 cm on image 83. This could reflect tumor or hematoma. IMPRESSION: 1. New right psoas muscle mass-like enlargement measuring 5.0 x 4.6 cm, indeterminate for tumor versus hematoma; recommend correlation with clinical status and consider further evaluation with contrast-enhanced MRI or short-interval follow-up imaging for characterization and to exclude progression of metastatic disease. 2. Previously noted left psoas/paraspinal mass has decreased significantly in size, now approximately 2.8 x 1.9 cm from 10.2 x 8.5 x 7.5 cm, compatible with treatment response. 3. Coronary artery disease, aortic atherosclerosis. Electronically signed by: Franky Crease MD 08/09/2024 08:25 PM EST RP Workstation: [id number]   EEG adult Result Date: 08/09/2024 [name] Brenda HERO, MD     08/10/2024  7:23 AM Routine EEG Report Brenda [name] is a 63 y.o. female with a history of altered mental status who is undergoing an EEG to evaluate for seizures. Report: This EEG was acquired with electrodes placed according to the International 10-20 electrode system (including Fp1, Fp2, F3, F4, C3, C4, P3, P4, O1, O2, T3, T4, T5, T6, A1, A2, Fz, Cz, Pz). The following electrodes were missing or displaced: none. The occipital dominant rhythm was 5-6 Hz. This activity is reactive to stimulation. Drowsiness was manifested by background fragmentation; deeper stages of sleep were identified by K complexes and sleep spindles. There was no focal slowing. There were no interictal epileptiform discharges. There were no electrographic seizures identified. There was no abnormal response to photic stimulation or hyperventilation.  Impression and clinical correlation: This EEG was obtained while awake and asleep and is abnormal due to moderate diffuse slowing indicative of global cerebral dysfunction. Epileptiform abnormalities were not seen during this recording. Brenda Matthews, MD [facility] [phone] If 7pm- 7am, please  page neurology on call as listed in AMION.   CT HEAD WO CONTRAST ( ) Result Date: 08/09/2024 EXAM: CT HEAD WITHOUT CONTRAST 08/09/2024 05:18:12 PM TECHNIQUE: CT of the head was performed without the administration of intravenous contrast. Automated exposure control, iterative reconstruction, and/or weight based adjustment of the mA/kV was utilized to reduce the radiation dose to as low as reasonably achievable. COMPARISON: 07/18/2024 CLINICAL HISTORY: Neuro deficit, acute, stroke suspected FINDINGS: BRAIN AND VENTRICLES: No acute hemorrhage. No evidence of acute infarct. Remote right PCA territory infarct. Unchanged calcified meningioma along the left frontal convexity. Calcific atherosclerosis. No hydrocephalus. No extra-axial collection. No mass effect or midline shift. ORBITS: No acute abnormality. SINUSES: No acute abnormality. SOFT TISSUES AND SKULL: No acute soft tissue abnormality. No skull fracture. IMPRESSION: 1. No acute intracranial abnormality. 2. Remote right PCA territory infarct. 3. Unchanged calcified left frontal convexity meningioma. Electronically signed by: Lonni Necessary MD 08/09/2024 06:23 PM EST RP Workstation: [id number]    Medications: I have reviewed the patient's current medications.  Assessment/Plan:  Endometrial cancer Diagnosed in 2023, status post surgery and radiation Recurrent disease July 2025 with a left retroperitoneal mass, biopsy confirmed adenocarcinoma, ER positive, MSI-high, tumor mutation burden 24, PD-L1 75%, PIK 3CA mutated, PTEN mutated, HER2 1+, loss of MLH1 expression 04/03/2024 CT chest: Spiculated 9 x 12 mm right upper lobe and part solid 4 x 6  mm right upper lobe nodules Cycle 1 paclitaxel /carboplatin /pembrolizumab  05/01/2024 Cycle 2 paclitaxel /carboplatin /pembrolizumab  06/30/2024 06/19/2024 CT abdomen/pelvis: Right lower lobe pulmonary embolism, splenic infarct, stable size of the left psoas mass with evidence of erosive change at the anterior aspect of L2, decreased size of left external iliac node 08/09/2024 CTs: New psoas muscle mass (tumor versus hematoma), left psoas/paraspinal mass has decreased in size, by my review the previously noted lung nodules have resolved  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  DVT 06/02/2024 4.  Pulmonary embolism noted on CT chest 06/19/2024 5.  Diabetes 6.  CHF/CAD 7.  Seizures, cefepime  toxicity?  November 2025 8.  Progressive generalized loss of motor function-related to CVAs?,  Paraneoplastic? 9.  Respiratory failure 10.  Pain secondary to #1 11.  Anemia  Brenda [name] has a history of endometrial cancer initially diagnosed in 2020.  She was diagnosed with recurrent disease involving a left retroperitoneal mass in July 2025.  She completed 2 treatments with paclitaxel /carboplatin /pembrolizumab .  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  She completed 2 cycles of systemic therapy for treatment of the uterine cancer.  I reviewed the restaging CT findings with Brenda [name] and her daughter.  The known malignancy at the left retroperitoneum has improved significantly.  The significance of the right psoas mass is unclear.  I suspect this is a benign finding.      The etiology of the diffuse generalized weakness is unclear.  There is likely a component related to the embolic CVAs and  deconditioning, but the evolution of the loss of motor function over the past several weeks appears to be more severe than expected.  She has undergone an extensive neurologic evaluation over the past month.  It is possible the motor weakness is related to a paraneoplastic syndrome or neurotoxicity from pembrolizumab .  She is being followed by the neurology  service.  I have communicated with neurology.  Brenda [name] will be a candidate to continue treatment with immunotherapy if her neurologic status improves.  The etiology of her pain is unclear based on the restaging CT.  The retroperitoneal mass is much smaller.  She could be having pain from the right psoas lesion or another etiology.  Recommendations: Continue narcotic analgesics as needed for pain Evaluation of the severe loss of motor function per neurology, trial of steroids if PD-L1 neurotoxicity suspected Anticoagulation for the DVT/PE and CVAs, will need to consider holding anticoagulation if the right psoas mass is confirmed to be a hematoma Tube feedings, follow-up with speech pathology Seizure management per neurology            LOS: 25 days   Arley Hof, MD   08/11/2024, 1:03 PM

## 2024-08-11 NOTE — Progress Notes (Signed)
 Speech Language Pathology Treatment: Dysphagia  Patient Details Name: Brenda [name] MRN: [id number] DOB: Sep 23, 1960 Today's Date: 08/11/2024 Time: 8675-8591 SLP Time Calculation (min) (ACUTE ONLY): 44 min  Assessment / Plan / Recommendation Clinical Impression  Pt seen for dysphagia intervention with husband present who states pt dislikes the puree and is not eating it except for puree fruit and magic cup. Observed with peaches cut into minced size with minimal ability to masticate, difficulty transiting to posterior oral cavity however with small amount of liquid via end of straw was able to propel and clear oral cavity x 2. After taking small amounts via end of straw pt consumed sequential straw sips Sprite without s/s aspiration. Discussed in depth with husband and will place pt on regular texture with family ordering foods she can tolerate with minimal mastication for all meals and SLP/husband reviewed items on menu that are more appropriate. Advised against foods that require much mastication and he appears to have a good understanding of appropriate foods and administration and continued use of strategies. ST will follow up for efficiency and further education.    HPI HPI: Brenda [name] is a 63 y.o. year old presented to [facility] with chills and weakness, work up for UTI, finding of PE, transferred to [facility]. BSE 11/14 recommended Dys 3/thin. Over weekend pt had significant decline in status, alertness. MRI 11/15 revealed no acute infarcts, interval evolution of previously identified embolic infarcts. Pt lethargic with no improvements over 3 sessions and discharged requesting re-eval if  improvement. MD/family requested re eval of swallow as some sedation meds have been d/c'd and showing fluctuating periods of alertness. PMH:  metastatic endometrial cancer, and chronic cancer-related pain, CVA, hypertension, hyperlipidemia, diabetes mellitus, depression, anxiety, DVT and PE , OSA, chronic hypoxic  respiratory failure.      SLP Plan  Continue with current plan of care        Swallow Evaluation Recommendations   Recommendations: PO diet PO Diet Recommendation: Regular;Thin liquids (Level 0) (family to order what pt can manipulate) Liquid Administration via: Straw Medication Administration: Via alternative means Supervision: Full supervision/cueing for swallowing strategies;Full assist for feeding Postural changes: Position pt fully upright for meals Oral care recommendations: Oral care BID (2x/day)     Recommendations                     Oral care BID   Frequent or constant Supervision/Assistance Dysphagia, oropharyngeal phase (R13.12)     Continue with current plan of care     Dustin Olam Bull  08/11/2024, 2:33 PM

## 2024-08-11 NOTE — Progress Notes (Signed)
 PROGRESS NOTE    Brenda [name]  FMW:[id number] DOB: 05-11-1961 DOA: 07/15/2024 PCP: [facility]  No chief complaint on file.   Brief Narrative:   63 year old female with history of metastatic endometrial cancer, chronic cancer-related pain, hypertension, hyperlipidemia, diabetes mellitus, CVA, history of DVT and PE anticoagulated on Lovenox , OSA, chronic hypoxic respiratory failure on 2L via nasal cannula and recent hospitalization and discharged on 07/14/2024 for UTI treated with IV antibiotics followed by oral antibiotics on discharge presented with chills and weakness.  She was found to have UTI and was started on IV cefepime .  Unfortunately mental status worsened and she was transferred to [facility] for continuous EEG monitoring with 4 seizures noted on video EEG. She was on Keppra  11/16. Placed on empiric broad-spectrum antibiotics for possible meningitis/encephalitis which was ruled out based on CSF results. As per husband, mental status worsened since antibiotics stopped. Not able to participate with any meaningful history and following only few commands.    Neurology has been reconsulted with development of rigidity.  Oncology has been reconsulted.  Family has begun to discuss palliative care, but at this point managing her cancer related pain and working on rigidity with neurology.  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  I had Brenda [name] conversation with them about comfort focused care and hospice.  At this point, working up the acute issues below, but I think ultimately comfort focused care is likely to an appropriate care plan based on her presentation.  Further decisions are pending workup and response to treatment for her rigidity/encephalopathy.    Rigidity  Seizures Note 11/15 she had stiffening of upper extremities and was transferred to [facility] for Continuous EEG. 4 seizures noted 11/16 S/p LP, meningitis ruled out Her husband notes worsening stiffness/rigidity/inability to move extremities starting about 1 week ago  EEG without epileptiform abnormalities head CT without acute intracranial abnormality MRI brain with evolving infarcts - MRI C spine without evidence of metastatic disease Continue keppra   Awaiting additional neurology recommendations  Acute Metabolic Encephalopathy Sounds like suspicion that this and seizures above related to cefepime  Delirium precautions - she has continued hospital delirium - lethargic, slow speech, often needs to be prompted multiple times to respond Caution with pain meds - issues with somnolence on fentanyl , will balance need for opiates with sedation  Metastatic Endometrial Cancer Cancer Related Pain  Appreciate oncology consult  CT chest/abdomen/pelvis for persistent (worsening?) back pain (note centrally necrotic infiltrative mass in L psoas in 06/2024) CT 12/7 with R psoas muscle mass like enlargement measuring 5.0 x 4.6 cm indeterminate for tumor vs hematoma (recommended correlation with clinical status and possible contrast enhanced mri or short interval follow imaging) - L psoas/paraspinal mass decreased in size.   Appreciate oncology needs Will continue to adjust pain meds  Elevated LFT's Monitor   Dysphagia Cortrak + tube  feeds  Depression Anxiety Prozac  per  palliative  Anemia S/p 1 unit pRBC 12/1 S/p IV iron   Normal folate, b12.  Normal iron , ferritin.   Pulmonary Embolism Splenic Infarct Lovenox   CAD  Hx Stroke On therapeutic lovenox  above  Hypernatremia Resolved  Abnormal Thyroid  Function Tests Repeat TSH and free T4 wnl Negative TSI  T2DM Lantus , SSI - adjust as needed  Pressure Ulcer Wound 07/25/24 1100 Pressure Injury Sacrum Medial Deep Tissue Pressure Injury - Purple or maroon localized area of discolored intact skin or blood-filled blister due to damage of underlying soft tissue from pressure and/or shear. (Active)     Wound 08/11/24 0415 Pressure Injury Vertebral column Medial Stage 2 -  Partial thickness loss of dermis presenting as Brenda [name] shallow open injury with Brenda [name] red, pink wound bed without slough. (Active)   Obesity Body mass index is 34.02 kg/m.     DVT prophylaxis: lovenox  Code Status: DNR Family Communication: daughter at bedside 12/9 (husband over phone 12/8) Disposition:   Status is: Inpatient Remains inpatient appropriate because: need for continued inpatient care   Consultants:  Neurology Palliative care oncology  Procedures:  11/21 LP  EEG  Antimicrobials:  Anti-infectives (From admission, onward)    Start     Dose/Rate Route Frequency Ordered Stop   07/23/24 0600  vancomycin  (VANCOCIN ) IVPB 1000 mg/200 mL premix  Status:  Discontinued        1,000 mg 200 mL/hr over 60 Minutes Intravenous Every 12 hours 07/22/24 1834 07/24/24 1808   07/22/24 2200  meropenem  (MERREM ) 2 g in sodium chloride  0.9 % 100 mL IVPB  Status:  Discontinued        2 g 280 mL/hr over 30 Minutes Intravenous Every 8 hours 07/22/24 1601 07/24/24 1808   07/22/24 1700  vancomycin  (VANCOREADY) IVPB 1500 mg/300 mL        1,500 mg 150 mL/hr over 120 Minutes Intravenous  Once 07/22/24 1601 07/22/24 2031   07/22/24 1645  ampicillin  (OMNIPEN) 2 g in sodium chloride  0.9 % 100 mL IVPB  Status:  Discontinued        2 g 300  mL/hr over 20 Minutes Intravenous Every 6 hours 07/22/24 1557 07/24/24 1800   07/19/24 0530  meropenem  (MERREM ) 1 g in sodium chloride  0.9 % 100 mL IVPB  Status:  Discontinued        1 g 200 mL/hr over 30 Minutes Intravenous Every 8 hours 07/19/24 0438 07/22/24 1601   07/16/24 0900  ceFEPIme  (MAXIPIME ) 2 g in sodium chloride  0.9 % 100 mL IVPB  Status:  Discontinued        2 g 200 mL/hr over 30 Minutes Intravenous Every 8 hours 07/16/24 0811 07/19/24 0409   07/16/24 0815  ceFEPIme  (MAXIPIME ) 2 g in sodium chloride  0.9 % 100 mL IVPB  Status:  Discontinued        2 g 200 mL/hr over 30 Minutes Intravenous  Once 07/16/24 0803 07/16/24 0811   07/16/24 0330  cefTRIAXone  (ROCEPHIN ) 2 g in sodium chloride  0.9 % 100 mL IVPB        2 g 200 mL/hr over 30 Minutes Intravenous  Once 07/16/24 0315 07/16/24 9587       Subjective:  Older daughter, [name], at bedside Saw younger daughter, Lavanda in the hall She notes pain, but difficult to appreciate where this is, she says her bowels (I think - yesterday, she responded yes to every suggested location)  Objective: Vitals:   08/11/24 0000 08/11/24 0355 08/11/24 0500 08/11/24  0750  BP: (!) 112/55 (!) 165/58  (!) 146/32  Pulse: (!) 59 77  99  Resp: 18 18  16   Temp: 98.4 F (36.9 C) 97.7 F (36.5 C)  97.9 F (36.6 C)  TempSrc: Oral Oral  Oral  SpO2: 100% 100%  100%  Weight:   84.4 kg   Height:        Intake/Output Summary (Last 24 hours) at 08/11/2024 1046 Last data filed at 08/11/2024 0831 Gross per 24 hour  Intake 450 ml  Output 950 ml  Net -500 ml   Filed Weights   08/09/24 0500 08/10/24 0500 08/11/24 0500  Weight: 81.6 kg 83.5 kg 84.4 kg    Examination:  General: ill appearing Cardiovascular: RRR Lungs: unlabored Abdomen: Soft, nontender, nondistended Neurological: lethargic - delayed responses, often takes repeated prompting - rigidity is stable Extremities: No clubbing or cyanosis. No edema   Data Reviewed: I have  personally reviewed following labs and imaging studies  CBC: Recent Labs  Lab 08/05/24 0500 08/07/24 0600 08/09/24 1221 08/10/24 0405 08/11/24 0540  WBC 8.3 10.0 11.4* 8.6 8.2  NEUTROABS  --   --   --  6.2 6.0  HGB 9.5* 9.6* 10.1* 8.6* 9.6*  HCT 29.2* 29.9* 30.8* 27.1* 30.0*  MCV 85.4 86.4 87.3 89.7 89.8  PLT 340 352 324 266 277    Basic Metabolic Panel: Recent Labs  Lab 08/05/24 0500 08/07/24 0600 08/09/24 1221 08/10/24 0405 08/11/24 0540  NA 137 138 136 136 137  K 4.3 3.8 4.0 4.3 4.3  CL 96* 100 101 103 101  CO2 31 29 29 28 25   GLUCOSE 133* 96 86 135* 103*  BUN 15 18 15 15 16   CREATININE 0.44 0.35* 0.49 0.37* 0.66  CALCIUM  8.9 8.7* 8.5* 8.4* 8.5*  MG  --   --   --  1.8 1.9  PHOS  --   --   --  3.8 3.8    GFR: Estimated Creatinine Clearance: 74.4 mL/min (by C-G formula based on SCr of 0.66 mg/dL).  Liver Function Tests: Recent Labs  Lab 08/09/24 1221 08/10/24 0405 08/11/24 0540  AST 150* 103* 124*  ALT 168* 176* 210*  ALKPHOS 140* 193* 258*  BILITOT 0.7 0.6 0.6  PROT 6.2* 5.4* 6.2*  ALBUMIN 2.4* 2.1* 2.4*    CBG: Recent Labs  Lab 08/10/24 2025 08/11/24 0000 08/11/24 0356 08/11/24 0439 08/11/24 0747  GLUCAP 127* 90 61* 132* 105*     No results found for this or any previous visit (from the past 240 hours).       Radiology Studies: MR CERVICAL SPINE W WO CONTRAST Result Date: 08/09/2024 EXAM: MRI CERVICAL SPINE WITH AND WITHOUT CONTRAST 08/09/2024 07:04:28 PM TECHNIQUE: Multiplanar multisequence MRI of the cervical spine was performed without and with the administration of intravenous contrast. CONTRAST: 8 mL of Gadavist . COMPARISON: None available. CLINICAL HISTORY: Metastatic disease evaluation, myelopathy, acute, cervical spine. FINDINGS: BONES AND ALIGNMENT: Normal alignment. Normal vertebral body heights. No suspicious bone lesions. No abnormal enhancement. SPINAL CORD: Normal spinal cord size. Normal spinal cord signal. SOFT TISSUES: No  paraspinal mass. C2-C3: No significant disc herniation. No spinal canal stenosis or neural foraminal narrowing. C3-C4: Left facet and uncovertebral hypertrophy. Mild left foraminal stenosis. Patent canal. C4-C5: No significant disc herniation. No spinal canal stenosis or neural foraminal narrowing. C5-C6: Right facet and uncovertebral hypertrophy. Mild right foraminal stenosis. Patent canal. C6-C7: Right facet and uncovertebral hypertrophy. Mild right foraminal stenosis. Patent canal. C7-T1: No significant disc herniation.  No spinal canal stenosis or neural foraminal narrowing. IMPRESSION: 1. No evidence of metastatic disease. 2. Mild foraminal stenosis on the left at C3-C4 and right at C5-C6 and C6-C7. Electronically signed by: Gilmore Molt MD 08/09/2024 09:23 PM EST RP Workstation: [id number]   MR BRAIN W WO CONTRAST Result Date: 08/09/2024 EXAM: MRI BRAIN WITH AND WITHOUT CONTRAST 08/09/2024 07:04:11 PM TECHNIQUE: Multiplanar multisequence MRI of the head/brain was performed with and without the administration of intravenous contrast. CONTRAST: 8 mL of Gadavist . COMPARISON: MR Brain without Contrast 07/18/2024. CLINICAL HISTORY: Metastatic disease evaluation, neuro deficit, acute, stroke suspected. FINDINGS: BRAIN AND VENTRICLES: Multifocally evolving infarcts in the anterior and posterior circulation including small infarct in bilateral frontoparietal white matter and right occipital lobe with developing encephalomalacia and decreased restricted diffusion. No evidence of new/interval acute infarct. No acute intracranial hemorrhage. No mass effect or midline shift. No hydrocephalus. Normal flow voids. No mass or abnormal enhancement. ORBITS: No acute abnormality. SINUSES: No acute abnormality. BONES AND SOFT TISSUES: Normal bone marrow signal and enhancement. No acute soft tissue abnormality. IMPRESSION: 1. Evolving infarcts without new/interval acute abnormality. 2. No evidence of metastatic disease.  Electronically signed by: Gilmore Molt MD 08/09/2024 08:50 PM EST RP Workstation: [id number]   CT CHEST ABDOMEN PELVIS W CONTRAST Result Date: 08/09/2024 EXAM: CT CHEST, ABDOMEN AND PELVIS WITH CONTRAST 08/09/2024 05:06:09 PM TECHNIQUE: CT of the chest, abdomen and pelvis was performed with the administration of intravenous contrast. 75 mL of iohexol  (OMNIPAQUE ) 350 MG/ML injection was administered. Multiplanar reformatted images are provided for review. Automated exposure control, iterative reconstruction, and/or weight based adjustment of the mA/kV was utilized to reduce the radiation dose to as low as reasonably achievable. COMPARISON: CT abdomen and pelvis 06/19/2024. CLINICAL HISTORY: Metastatic disease evaluation. FINDINGS: CHEST: MEDIASTINUM AND LYMPH NODES: Heart and pericardium are unremarkable. The central airways are clear. No mediastinal, hilar or axillary lymphadenopathy. LUNGS AND PLEURA: No focal consolidation or pulmonary edema. No pleural effusion or pneumothorax. No pulmonary nodules. ABDOMEN AND PELVIS: LIVER: The liver is unremarkable. GALLBLADDER AND BILE DUCTS: Prior cholecystectomy. Slight intrahepatic and extrahepatic biliary ductal dilatation likely related to post-cholecystectomy state, stable since prior study. SPLEEN: Peripheral wedge-shaped low-density area posteriorly in the spleen, likely splenic infarct, decreased in size since prior study. PANCREAS: No acute abnormality. ADRENAL GLANDS: No acute abnormality. KIDNEYS, URETERS AND BLADDER: No stones in the kidneys or ureters. No hydronephrosis. No perinephric or periureteral stranding. Urinary bladder is unremarkable. GI AND BOWEL: Stomach demonstrates no acute abnormality. Feeding tube tip in the proximal stomach. There is no bowel obstruction. REPRODUCTIVE ORGANS: Prior hysterectomy. PERITONEUM AND RETROPERITONEUM: No ascites. No free air. VASCULATURE: Aorta is normal in caliber. Coronary artery and aortic atherosclerosis.  Right port-Geneive Sandstrom-cath in place with the tip at the cavoatrial junction. ABDOMINAL AND PELVIS LYMPH NODES: No lymphadenopathy. BONES AND SOFT TISSUES: No acute osseous abnormality. Previously seen left psoas/paraspinal mass has decreased significantly in size, now difficult to visualize but measuring approximately 2.8 x 1.9 cm, previously 10.2 x 8.5 x 7.5 cm. There is new abnormality/enlargement within the right psoas muscle with Quantay Zaremba mass-like area measuring 5 x 4.6 cm on image 83. This could reflect tumor or hematoma. IMPRESSION: 1. New right psoas muscle mass-like enlargement measuring 5.0 x 4.6 cm, indeterminate for tumor versus hematoma; recommend correlation with clinical status and consider further evaluation with contrast-enhanced MRI or short-interval follow-up imaging for characterization and to exclude progression of metastatic disease. 2. Previously noted left psoas/paraspinal mass has decreased significantly in size, now approximately  2.8 x 1.9 cm from 10.2 x 8.5 x 7.5 cm, compatible with treatment response. 3. Coronary artery disease, aortic atherosclerosis. Electronically signed by: Franky Crease MD 08/09/2024 08:25 PM EST RP Workstation: [id number]   EEG adult Result Date: 08/09/2024 Matthews Elida HERO, MD     08/10/2024  7:23 AM Routine EEG Report Lynzee Lindquist is Lataysha Vohra 63 y.o. female with Radhika Dershem history of altered mental status who is undergoing an EEG to evaluate for seizures. Report: This EEG was acquired with electrodes placed according to the International 10-20 electrode system (including Fp1, Fp2, F3, F4, C3, C4, P3, P4, O1, O2, T3, T4, T5, T6, A1, A2, Fz, Cz, Pz). The following electrodes were missing or displaced: none. The occipital dominant rhythm was 5-6 Hz. This activity is reactive to stimulation. Drowsiness was manifested by background fragmentation; deeper stages of sleep were identified by K complexes and sleep spindles. There was no focal slowing. There were no interictal epileptiform  discharges. There were no electrographic seizures identified. There was no abnormal response to photic stimulation or hyperventilation. Impression and clinical correlation: This EEG was obtained while awake and asleep and is abnormal due to moderate diffuse slowing indicative of global cerebral dysfunction. Epileptiform abnormalities were not seen during this recording. Elida Matthews, MD [facility] [phone] If 7pm- 7am, please page neurology on call as listed in AMION.   CT HEAD WO CONTRAST ( ) Result Date: 08/09/2024 EXAM: CT HEAD WITHOUT CONTRAST 08/09/2024 05:18:12 PM TECHNIQUE: CT of the head was performed without the administration of intravenous contrast. Automated exposure control, iterative reconstruction, and/or weight based adjustment of the mA/kV was utilized to reduce the radiation dose to as low as reasonably achievable. COMPARISON: 07/18/2024 CLINICAL HISTORY: Neuro deficit, acute, stroke suspected FINDINGS: BRAIN AND VENTRICLES: No acute hemorrhage. No evidence of acute infarct. Remote right PCA territory infarct. Unchanged calcified meningioma along the left frontal convexity. Calcific atherosclerosis. No hydrocephalus. No extra-axial collection. No mass effect or midline shift. ORBITS: No acute abnormality. SINUSES: No acute abnormality. SOFT TISSUES AND SKULL: No acute soft tissue abnormality. No skull fracture. IMPRESSION: 1. No acute intracranial abnormality. 2. Remote right PCA territory infarct. 3. Unchanged calcified left frontal convexity meningioma. Electronically signed by: Lonni Necessary MD 08/09/2024 06:23 PM EST RP Workstation: [id number]        Scheduled Meds:  atorvastatin   80 mg Per Tube QHS   baclofen   5 mg Per Tube BID   Chlorhexidine  Gluconate Cloth  6 each Topical Daily   enoxaparin  (LOVENOX ) injection  1 mg/kg Subcutaneous Q12H   feeding supplement  237 mL Oral Q24H   feeding supplement (PROSource TF20)  60 mL Per Tube Daily   free water    75 mL Per Tube Q4H   insulin  aspart  0-9 Units Subcutaneous Q4H   insulin  glargine  15 Units Subcutaneous BID   levETIRAcetam   500 mg Per Tube BID   lidocaine   1 patch Transdermal Daily   oxyCODONE   10 mg Oral QHS   propranolol   20 mg Per Tube TID   sodium chloride  flush  10-40 mL Intracatheter Q12H   Continuous Infusions:  feeding supplement (JEVITY 1.5 CAL/FIBER) 1,000 mL (08/10/24 1525)     LOS: 25 days    Time spent: over 30 min     Meliton Monte, MD [facility]   To contact the attending provider between 7A-7P or the covering provider during after hours 7P-7A, please log into the web site [email or web address] and access using universal [place] password for that web  site. If you do not have the password, please call the hospital operator.  08/11/2024, 10:46 AM

## 2024-08-12 LAB — CBC WITH DIFFERENTIAL/PLATELET
Basophils Absolute: 0 K/uL (ref 0.0–0.1)
Basophils Relative: 0 %
Eosinophils Absolute: 0 K/uL (ref 0.0–0.5)
Eosinophils Relative: 0 %
HCT: 33.2 % — ABNORMAL LOW (ref 36.0–46.0)
Hemoglobin: 10.6 g/dL — ABNORMAL LOW (ref 12.0–15.0)
Lymphocytes Relative: 4 %
Lymphs Abs: 0.3 K/uL — ABNORMAL LOW (ref 0.7–4.0)
MCH: 28.6 pg (ref 26.0–34.0)
MCHC: 31.9 g/dL (ref 30.0–36.0)
MCV: 89.7 fL (ref 80.0–100.0)
Monocytes Absolute: 0 K/uL — ABNORMAL LOW (ref 0.1–1.0)
Monocytes Relative: 0 %
Neutro Abs: 7.6 K/uL (ref 1.7–7.7)
Neutrophils Relative %: 96 %
Platelets: 290 K/uL (ref 150–400)
RBC: 3.7 MIL/uL — ABNORMAL LOW (ref 3.87–5.11)
RDW: 19.1 % — ABNORMAL HIGH (ref 11.5–15.5)
WBC: 7.9 K/uL (ref 4.0–10.5)
nRBC: 0 % (ref 0.0–0.2)

## 2024-08-12 LAB — COMPREHENSIVE METABOLIC PANEL WITH GFR
ALT: 186 U/L — ABNORMAL HIGH (ref 0–44)
AST: 77 U/L — ABNORMAL HIGH (ref 15–41)
Albumin: 2.5 g/dL — ABNORMAL LOW (ref 3.5–5.0)
Alkaline Phosphatase: 255 U/L — ABNORMAL HIGH (ref 38–126)
Anion gap: 6 (ref 5–15)
BUN: 17 mg/dL (ref 8–23)
CO2: 28 mmol/L (ref 22–32)
Calcium: 8.5 mg/dL — ABNORMAL LOW (ref 8.9–10.3)
Chloride: 100 mmol/L (ref 98–111)
Creatinine, Ser: 0.58 mg/dL (ref 0.44–1.00)
GFR, Estimated: >60 mL/min (ref >60)
Glucose, Bld: 278 mg/dL — ABNORMAL HIGH (ref 70–99)
Potassium: 4.2 mmol/L (ref 3.5–5.1)
Sodium: 134 mmol/L — ABNORMAL LOW (ref 135–145)
Total Bilirubin: 0.7 mg/dL (ref 0.0–1.2)
Total Protein: 6.9 g/dL (ref 6.5–8.1)

## 2024-08-12 LAB — GLUCOSE, CAPILLARY
Glucose-Capillary: 265 mg/dL — ABNORMAL HIGH (ref 70–99)
Glucose-Capillary: 356 mg/dL — ABNORMAL HIGH (ref 70–99)
Glucose-Capillary: 423 mg/dL — ABNORMAL HIGH (ref 70–99)
Glucose-Capillary: 402 mg/dL — ABNORMAL HIGH (ref 70–99)
Glucose-Capillary: 399 mg/dL — ABNORMAL HIGH (ref 70–99)
Glucose-Capillary: 323 mg/dL — ABNORMAL HIGH (ref 70–99)

## 2024-08-12 LAB — PHOSPHORUS: Phosphorus: 3.3 mg/dL (ref 2.5–4.6)

## 2024-08-12 LAB — MAGNESIUM: Magnesium: 1.9 mg/dL (ref 1.7–2.4)

## 2024-08-12 MED ORDER — INSULIN GLARGINE 100 UNIT/ML ~~LOC~~ SOLN
25.0000 [IU] | Freq: Two times a day (BID) | SUBCUTANEOUS | Status: DC
  Administered 2024-08-12 – 2024-08-16 (×8): 25 [IU] via SUBCUTANEOUS
  Filled 2024-08-12 (×9): qty 0.25

## 2024-08-12 MED ORDER — INSULIN GLARGINE 100 UNIT/ML ~~LOC~~ SOLN
20.0000 [IU] | Freq: Two times a day (BID) | SUBCUTANEOUS | Status: DC
  Filled 2024-08-12: qty 0.2

## 2024-08-12 MED ORDER — SODIUM CHLORIDE 0.9 % IV SOLN: 1000.0000 mg | INTRAVENOUS | Status: DC

## 2024-08-12 MED ORDER — INSULIN ASPART 100 UNIT/ML IJ SOLN
0.0000 [IU] | INTRAMUSCULAR | Status: DC
  Administered 2024-08-12: 15 [IU] via SUBCUTANEOUS
  Administered 2024-08-12 (×3): 20 [IU] via SUBCUTANEOUS
  Administered 2024-08-12: 11 [IU] via SUBCUTANEOUS
  Administered 2024-08-13: 7 [IU] via SUBCUTANEOUS
  Administered 2024-08-13: 4 [IU] via SUBCUTANEOUS
  Administered 2024-08-13: 11 [IU] via SUBCUTANEOUS
  Administered 2024-08-13 (×3): 7 [IU] via SUBCUTANEOUS
  Administered 2024-08-14: 4 [IU] via SUBCUTANEOUS
  Administered 2024-08-14: 11 [IU] via SUBCUTANEOUS
  Administered 2024-08-14 (×2): 7 [IU] via SUBCUTANEOUS
  Administered 2024-08-15: 11 [IU] via SUBCUTANEOUS
  Administered 2024-08-15 (×3): 4 [IU] via SUBCUTANEOUS
  Administered 2024-08-15: 6 [IU] via SUBCUTANEOUS
  Administered 2024-08-15: 11 [IU] via SUBCUTANEOUS
  Administered 2024-08-16: 15 [IU] via SUBCUTANEOUS
  Administered 2024-08-16: 20 [IU] via SUBCUTANEOUS
  Filled 2024-08-12: qty 4
  Filled 2024-08-12: qty 11
  Filled 2024-08-12: qty 16
  Filled 2024-08-12: qty 4
  Filled 2024-08-12: qty 15
  Filled 2024-08-12 (×2): qty 7
  Filled 2024-08-12: qty 6
  Filled 2024-08-12: qty 10
  Filled 2024-08-12: qty 4
  Filled 2024-08-12: qty 11
  Filled 2024-08-12: qty 6
  Filled 2024-08-12 (×3): qty 7
  Filled 2024-08-12: qty 4
  Filled 2024-08-12: qty 20
  Filled 2024-08-12: qty 2
  Filled 2024-08-12: qty 4
  Filled 2024-08-12 (×2): qty 20
  Filled 2024-08-12: qty 7
  Filled 2024-08-12: qty 11
  Filled 2024-08-12: qty 15

## 2024-08-12 NOTE — Plan of Care (Signed)
°  Problem: Education: Goal: Ability to describe self-care measures that may prevent or decrease complications (Diabetes Survival Skills Education) will improve Outcome: Not Progressing   Problem: Health Behavior/Discharge Planning: Goal: Ability to identify and utilize available resources and services will improve Outcome: Not Progressing   Problem: Metabolic: Goal: Ability to maintain appropriate glucose levels will improve Outcome: Not Progressing

## 2024-08-12 NOTE — Progress Notes (Signed)
 PROGRESS NOTE    Brenda [name]  FMW:[id number] DOB: [birth year] DOA: 07/15/2024 PCP: [facility]  No chief complaint on file.   Brief Narrative:   63 year old female with history of metastatic endometrial cancer, chronic cancer-related pain, hypertension, hyperlipidemia, diabetes mellitus, CVA, history of DVT and PE anticoagulated on Lovenox , OSA, chronic hypoxic respiratory failure on 2L via nasal cannula and recent hospitalization and discharged on 07/14/2024 for UTI treated with IV antibiotics followed by oral antibiotics on discharge presented with chills and weakness.  She was found to have UTI and was started on IV cefepime .  Unfortunately mental status worsened and she was transferred to [facility] for continuous EEG monitoring with 4 seizures noted on video EEG. She was on Keppra  11/16. Placed on empiric broad-spectrum antibiotics for possible meningitis/encephalitis which was ruled out based on CSF results. .    Neurology has been reconsulted 12/7 with development of rigidity.  Oncology has been reconsulted.  Family has begun to discuss palliative care, but at this point managing her cancer related pain and working on rigidity with neurology.  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  I had Brenda [name] conversation with them  about comfort focused care and hospice, but at this point, we're trying to work up and treat the acute issues below (with regards to her rigidity and encephalopathy).  Currently allowing time for outcomes with neurology's evaluation and treatment of her acute issues below.  Further decisions will be pending her response to treatment.    Rigidity  Seizures Note 11/15 she had stiffening of upper extremities and was transferred to [facility] for Continuous EEG. 4 seizures noted 11/16 S/p LP, meningitis ruled out Her husband notes worsening stiffness/rigidity/inability to move extremities starting about 1 week ago  EEG without epileptiform abnormalities head CT without acute intracranial abnormality MRI brain with evolving infarcts - MRI C spine without evidence of metastatic disease Continue keppra   Awaiting additional neurology recommendations - ddx includes PD-1 antibody induced side effect, serotonin syndrome.  We've stopped prozac  (low dose, lower suspicion for serotonin syndrome?).  Plan is for high dose IV steroids.   Acute Metabolic Encephalopathy Sounds like suspicion that this and seizures above related to cefepime  Delirium precautions - she has continued hospital delirium - lethargic, slow speech, often needs to be prompted multiple times to respond -- worse today, in setting of steroids? Caution with pain meds - issues with somnolence on fentanyl , will balance need for opiates with sedation  Metastatic Endometrial Cancer Cancer Related Pain  Appreciate oncology consult  CT chest/abdomen/pelvis for persistent (worsening?) back pain (note centrally necrotic infiltrative mass in L psoas in 06/2024) CT 12/7 with R psoas muscle mass like enlargement measuring 5.0 x 4.6 cm indeterminate for tumor vs hematoma (recommended correlation with clinical status and possible contrast enhanced mri or short  interval follow imaging  - Hb is stable, consider repeat imaging in Brenda [name] week or so) - L psoas/paraspinal  mass decreased in size.   Appreciate oncology needs Will continue to adjust pain meds  Elevated LFT's Monitor  - mild improvement today  Dysphagia Cortrak + tube feeds  Depression Anxiety Prozac  discontinued with rigidity  Anemia S/p 1 unit pRBC 12/1 S/p IV iron   Normal folate, b12.  Normal iron , ferritin.   Pulmonary Embolism Splenic Infarct Therapeutic lovenox  (see possible hematoma above, low threshold to hold anticoagulation if drop in H/H or sudden worsening abdominal pain)  CAD  Hx Stroke On therapeutic lovenox  above  Hypernatremia Resolved  Abnormal Thyroid  Function Tests Repeat TSH and free T4 wnl Negative TSI  T2DM Lantus , SSI - adjust as needed currently with higher needs with high dose steroids, would expect her needs to decrease once this course is complete  Pressure Ulcer Wound 07/25/24 1100 Pressure Injury Sacrum Medial Deep Tissue Pressure Injury - Purple or maroon localized area of discolored intact skin or blood-filled blister due to damage of underlying soft tissue from pressure and/or shear. (Active)     Wound 08/11/24 0415 Pressure Injury Vertebral column Medial Stage 2 -  Partial thickness loss of dermis presenting as Brenda [name] shallow open injury with Brenda [name] red, pink wound bed without slough. (Active)   Obesity Body mass index is 34.02 kg/m.     DVT prophylaxis: lovenox  Code Status: DNR Family Communication: older daughter 12/10 at bedside Disposition:   Status is: Inpatient Remains inpatient appropriate because: need for continued inpatient care   Consultants:  Neurology Palliative care oncology  Procedures:  11/21 LP  EEG  Antimicrobials:  Anti-infectives (From admission, onward)    Start     Dose/Rate Route Frequency Ordered Stop   07/23/24 0600  vancomycin  (VANCOCIN ) IVPB 1000 mg/200 mL premix  Status:  Discontinued        1,000 mg 200 mL/hr over 60 Minutes Intravenous Every 12 hours 07/22/24 1834 07/24/24 1808   07/22/24 2200   meropenem  (MERREM ) 2 g in sodium chloride  0.9 % 100 mL IVPB  Status:  Discontinued        2 g 280 mL/hr over 30 Minutes Intravenous Every 8 hours 07/22/24 1601 07/24/24 1808   07/22/24 1700  vancomycin  (VANCOREADY) IVPB 1500 mg/300 mL        1,500 mg 150 mL/hr over 120 Minutes Intravenous  Once 07/22/24 1601 07/22/24 2031   07/22/24 1645  ampicillin  (OMNIPEN) 2 g in sodium chloride  0.9 % 100 mL IVPB  Status:  Discontinued        2 g 300 mL/hr over 20 Minutes Intravenous Every 6 hours 07/22/24 1557 07/24/24 1800   07/19/24 0530  meropenem  (MERREM ) 1 g in sodium chloride  0.9 % 100 mL IVPB  Status:  Discontinued        1 g 200 mL/hr over 30 Minutes Intravenous Every 8 hours 07/19/24 0438 07/22/24 1601   07/16/24 0900  ceFEPIme  (MAXIPIME ) 2 g in sodium chloride  0.9 % 100 mL IVPB  Status:  Discontinued        2 g 200 mL/hr over 30 Minutes Intravenous Every 8 hours 07/16/24 0811 07/19/24 0409   07/16/24 0815  ceFEPIme  (MAXIPIME ) 2 g in sodium chloride  0.9 % 100 mL IVPB  Status:  Discontinued        2 g 200 mL/hr over 30 Minutes Intravenous  Once 07/16/24 0803 07/16/24 0811   07/16/24 0330  cefTRIAXone  (ROCEPHIN ) 2 g in sodium  chloride 0.9 % 100 mL IVPB        2 g 200 mL/hr over 30 Minutes Intravenous  Once 07/16/24 0315 07/16/24 9587       Subjective:  Older daughter, Brenda [name], at bedside 12/10 Notes issues sleeping overnight - they think it's steroids keeping her up Brenda [name] is more difficult to understand today, lethargic   Objective: Vitals:   08/12/24 0345 08/12/24 0408 08/12/24 0721 08/12/24 1113  BP:  (!) 146/71 (!) 174/82 (!) 156/71  Pulse:  88 99 80  Resp:   18 18  Temp:  98.8 F (37.1 C) 98.9 F (37.2 C) 98.6 F (37 C)  TempSrc:  Oral Axillary Oral  SpO2:  100% 99% 100%  Weight: 84.4 kg     Height:        Intake/Output Summary (Last 24 hours) at 08/12/2024 1415 Last data filed at 08/12/2024 1157 Gross per 24 hour  Intake 516 ml  Output 1250 ml  Net -734 ml    Filed Weights   08/10/24 0500 08/11/24 0500 08/12/24 0345  Weight: 83.5 kg 84.4 kg 84.4 kg    Examination:  General: No acute distress. Cardiovascular: RRR Lungs: unlabored Abdomen: Soft, nontender, nondistended  Neurological: Brenda [name] little more lethargic, more difficult to understand today - remains rigid  Extremities: No clubbing or cyanosis. No edema.   Data Reviewed: I have personally reviewed following labs and imaging studies  CBC: Recent Labs  Lab 08/07/24 0600 08/09/24 1221 08/10/24 0405 08/11/24 0540 08/12/24 0300  WBC 10.0 11.4* 8.6 8.2 7.9  NEUTROABS  --   --  6.2 6.0 7.6  HGB 9.6* 10.1* 8.6* 9.6* 10.6*  HCT 29.9* 30.8* 27.1* 30.0* 33.2*  MCV 86.4 87.3 89.7 89.8 89.7  PLT 352 324 266 277 290    Basic Metabolic Panel: Recent Labs  Lab 08/07/24 0600 08/09/24 1221 08/10/24 0405 08/11/24 0540 08/12/24 0300  NA 138 136 136 137 134*  K 3.8 4.0 4.3 4.3 4.2  CL 100 101 103 101 100  CO2 29 29 28 25 28   GLUCOSE 96 86 135* 103* 278*  BUN 18 15 15 16 17   CREATININE 0.35* 0.49 0.37* 0.66 0.58  CALCIUM  8.7* 8.5* 8.4* 8.5* 8.5*  MG  --   --  1.8 1.9 1.9  PHOS  --   --  3.8 3.8 3.3    GFR: Estimated Creatinine Clearance: 74.4 mL/min (by C-G formula based on SCr of 0.58 mg/dL).  Liver Function Tests: Recent Labs  Lab 08/09/24 1221 08/10/24 0405 08/11/24 0540 08/12/24 0300  AST 150* 103* 124* 77*  ALT 168* 176* 210* 186*  ALKPHOS 140* 193* 258* 255*  BILITOT 0.7 0.6 0.6 0.7  PROT 6.2* 5.4* 6.2* 6.9  ALBUMIN 2.4* 2.1* 2.4* 2.5*    CBG: Recent Labs  Lab 08/11/24 2333 08/12/24 0306 08/12/24 0724 08/12/24 1148 08/12/24 1313  GLUCAP 146* 265* 356* 423* 402*     No results found for this or any previous visit (from the past 240 hours).       Radiology Studies: No results found.       Scheduled Meds:  atorvastatin   80 mg Per Tube QHS   baclofen   5 mg Per Tube BID   Chlorhexidine  Gluconate Cloth  6 each Topical Daily   enoxaparin   (LOVENOX ) injection  1 mg/kg Subcutaneous Q12H   feeding supplement  237 mL Oral Q24H   feeding supplement (PROSource TF20)  60 mL Per Tube Daily   free water   75  mL Per Tube Q4H   insulin  aspart  0-20 Units Subcutaneous Q4H   insulin  glargine  25 Units Subcutaneous BID   levETIRAcetam   500 mg Per Tube BID   lidocaine   1 patch Transdermal Daily   oxyCODONE   10 mg Oral QHS   pantoprazole  (PROTONIX ) IV  40 mg Intravenous Q24H   propranolol   20 mg Per Tube TID   sodium chloride  flush  10-40 mL Intracatheter Q12H   Continuous Infusions:  feeding supplement (JEVITY 1.5 CAL/FIBER) 1,000 mL (08/11/24 1212)   methylPREDNISolone  (SOLU-MEDROL ) injection Stopped (08/11/24 2154)     LOS: 26 days    Time spent: over 30 min     Meliton Monte, MD [facility]   To contact the attending provider between 7A-7P or the covering provider during after hours 7P-7A, please log into the web site [email or web address] and access using universal [place] password for that web site. If you do not have the password, please call the hospital operator.  08/12/2024, 2:15 PM

## 2024-08-12 NOTE — Inpatient Diabetes Management (Signed)
 Inpatient Diabetes Program Recommendations  AACE/ADA: New Consensus Statement on Inpatient Glycemic Control (2015)  Target Ranges:  Prepandial:   less than 140 mg/dL      Peak postprandial:   less than 180 mg/dL (1-2 hours)      Critically ill patients:  140 - 180 mg/dL   Lab Results  Component Value Date   GLUCAP 356 (H) 08/12/2024   HGBA1C 7.2 (H) 06/01/2024    Review of Glycemic Control  Latest Reference Range & Units 08/11/24 16:06 08/11/24 20:18 08/11/24 23:33 08/12/24 03:06 08/12/24 07:24  Glucose-Capillary 70 - 99 mg/dL 818 (H) 838 (H) 853 (H) 265 (H) 356 (H)  (H): Data is abnormally high  Diabetes history: DM2 Outpatient Diabetes medications: Tresiba  18 daily, Fiasp  1-3 units TID, metformin  1000 mg BID Current orders for Inpatient glycemic control: Lantus  15 units BID, Novolog  0-20 units Q4H, Solumedrol 1000 mg every day, Jevity @ 45 ml/hr  Inpatient Diabetes Program Recommendations:    Please consider:  Novolog  3 units Q4H tube feed coverage.  Hold if feeds are held or discontinued.    Thank you, Brenda Pinal, MSN, CDCES Diabetes Coordinator Inpatient Diabetes Program [phone] (team pager from 8a-5p)

## 2024-08-12 NOTE — Progress Notes (Signed)
 NEUROLOGY CONSULT FOLLOW UP NOTE   Date of service: August 12, 2024 Patient Name: Brenda [name] MRN:  [id number] DOB:  1961/02/28  Interval Hx/subjective  Patient seen and examined. Daughter at bedside Appears more awake today  Vitals   Vitals:   08/11/24 2335 08/12/24 0345 08/12/24 0408 08/12/24 0721  BP: (!) 157/58  (!) 146/71 (!) 174/82  Pulse: 62  88 99  Resp: 15   18  Temp: 98.8 F (37.1 C)  98.8 F (37.1 C) 98.9 F (37.2 C)  TempSrc: Oral  Oral Axillary  SpO2: 99%  100% 99%  Weight:  84.4 kg    Height:         Body mass index is 34.02 kg/m.  Physical Exam   General: Awake, alert and oriented to self. HEENT: Alopecia and facial hirsutism noted CVS: Regular rhythm Abdomen nondistended nontender Neurological exam She was awake and alert.  She was oriented to self.  Upon asking her age, she would only repeat her date of birth.  She was able to follow simple commands. Cranial nerves: Pupils equal round react light, extraocular movements appear intact, visual fields are full, face appears symmetric. Motor exam reveals increased tone in all 4 extremities with paratonia and pain on passive movement of all 4 extremities.  She was able to raise both upper extremities against gravity and keep it up for at least 5 to 10 seconds which is somewhat of an improvement from the previous days that have seen her. Sensation appears intact to light touch Could not elicit any reflexes   Medications  Current Facility-Administered Medications:    artificial tears (LACRILUBE) ophthalmic ointment, , Both Eyes, Q4H PRN, Cosette Blackwater, MD, Given at 08/11/24 2102   atorvastatin  (LIPITOR ) tablet 80 mg, 80 mg, Per Tube, QHS, Sigdel, Santosh, MD, 80 mg at 08/11/24 2148   baclofen  (LIORESAL ) tablet 5 mg, 5 mg, Per Tube, BID, Chen, Lydia D, RPH, 5 mg at 08/11/24 2148   Chlorhexidine  Gluconate Cloth 2 % PADS 6 each, 6 each, Topical, Daily, Chavez, Abigail, NP, 6 each at 08/11/24 1055    enoxaparin  (LOVENOX ) injection 80 mg, 1 mg/kg, Subcutaneous, Q12H, Tariq, Hassan, MD, 80 mg at 08/11/24 2048   feeding supplement (ENSURE PLUS HIGH PROTEIN) liquid 237 mL, 237 mL, Oral, Q24H, Lama, Gagan S, MD, 237 mL at 08/11/24 1706   feeding supplement (JEVITY 1.5 CAL/FIBER) liquid 1,000 mL, 1,000 mL, Per Tube, Continuous, Cosette Blackwater, MD, Last Rate: 45 mL/hr at 08/11/24 1212, 1,000 mL at 08/11/24 1212   feeding supplement (PROSource TF20) liquid 60 mL, 60 mL, Per Tube, Daily, Sigdel, Santosh, MD, 60 mL at 08/11/24 1113   free water  75 mL, 75 mL, Per Tube, Q4H, Cosette Blackwater, MD, 75 mL at 08/12/24 0345   insulin  aspart (novoLOG ) injection 0-20 Units, 0-20 Units, Subcutaneous, Q4H, Powell, A Meliton Raddle., MD   insulin  glargine (LANTUS ) injection 15 Units, 15 Units, Subcutaneous, BID, Perri DELENA Meliton Raddle., MD, 15 Units at 08/11/24 2350   levETIRAcetam  (KEPPRA ) tablet 500 mg, 500 mg, Per Tube, BID, Chen, Lydia D, RPH, 500 mg at 08/11/24 2148   lidocaine  (LIDODERM ) 5 % 1 patch, 1 patch, Transdermal, Daily, Alekh, Kshitiz, MD, 1 patch at 08/11/24 1120   loperamide  HCl (IMODIUM ) 1 MG/7.5ML suspension 2 mg, 2 mg, Per Tube, PRN, Merilee Linsey I, RPH, 2 mg at 08/05/24 9178   methylPREDNISolone  sodium succinate (SOLU-MEDROL ) 1,000 mg in sodium chloride  0.9 % 50 mL IVPB, 1,000 mg, Intravenous, Q24H, Isaias Dowson, MD,  Stopped at 08/11/24 2154   morphine  (PF) 2 MG/ML injection 2 mg, 2 mg, Intravenous, Q2H PRN, 2 mg at 08/12/24 0318 **OR** morphine  (PF) 2 MG/ML injection 4 mg, 4 mg, Intravenous, Q2H PRN, Perri DELENA Meliton Mickey., MD, 4 mg at 08/11/24 1115   naloxone  (NARCAN ) injection 0.4 mg, 0.4 mg, Intravenous, PRN, Alfornia Madison, MD   ondansetron  (ZOFRAN ) tablet 4 mg, 4 mg, Per Tube, Q6H PRN **OR** ondansetron  (ZOFRAN ) injection 4 mg, 4 mg, Intravenous, Q6H PRN, Cosette Blackwater, MD   oxyCODONE  (Oxy IR/ROXICODONE ) immediate release tablet 10 mg, 10 mg, Oral, QHS, Cooper, Josseline P, PA-C, 10 mg at  08/11/24 2147   oxyCODONE  (Oxy IR/ROXICODONE ) immediate release tablet 2.5 mg, 2.5 mg, Per Tube, Q3H PRN, Cooper, Josseline P, PA-C, 2.5 mg at 08/11/24 1816   pantoprazole  (PROTONIX ) injection 40 mg, 40 mg, Intravenous, Q24H, Kennesha Brewbaker, MD, 40 mg at 08/11/24 1817   prochlorperazine  (COMPAZINE ) tablet 10 mg, 10 mg, Oral, Q6H PRN, Foust, Katy L, NP   propranolol  (INDERAL ) tablet 20 mg, 20 mg, Per Tube, TID, Sigdel, Santosh, MD, 20 mg at 08/11/24 2148   senna-docusate (Senokot-S) tablet 1 tablet, 1 tablet, Oral, QHS PRN, Foust, Katy L, NP   sodium chloride  flush (NS) 0.9 % injection 10-40 mL, 10-40 mL, Intracatheter, Q12H, Sigdel, Santosh, MD, 10 mL at 08/11/24 2148   sodium chloride  flush (NS) 0.9 % injection 10-40 mL, 10-40 mL, Intracatheter, PRN, Mcarthur Pick, MD  Labs and Diagnostic Imaging   CBC:  Recent Labs  Lab 08/11/24 0540 08/12/24 0300  WBC 8.2 7.9  NEUTROABS 6.0 7.6  HGB 9.6* 10.6*  HCT 30.0* 33.2*  MCV 89.8 89.7  PLT 277 290    Basic Metabolic Panel:  Lab Results  Component Value Date   NA 134 (L) 08/12/2024   K 4.2 08/12/2024   CO2 28 08/12/2024   GLUCOSE 278 (H) 08/12/2024   BUN 17 08/12/2024   CREATININE 0.58 08/12/2024   CALCIUM  8.5 (L) 08/12/2024   GFRNONAA >60 08/12/2024   GFRAA 94 12/16/2019   Lipid Panel:  Lab Results  Component Value Date   LDLCALC 28 05/30/2024   HgbA1c:  Lab Results  Component Value Date   HGBA1C 7.2 (H) 06/01/2024   Urine Drug Screen:     Component Value Date/Time   LABOPIA POSITIVE (A) 04/20/2024 1817   COCAINSCRNUR NONE DETECTED 04/20/2024 1817   LABBENZ NONE DETECTED 04/20/2024 1817   AMPHETMU NONE DETECTED 04/20/2024 1817   THCU NONE DETECTED 04/20/2024 1817   LABBARB NONE DETECTED 04/20/2024 1817    Alcohol Level     Component Value Date/Time   ETH <15 04/20/2024 1344   INR  Lab Results  Component Value Date   INR 1.0 07/16/2024   APTT  Lab Results  Component Value Date   APTT 67 (H) 06/21/2024    Imaging personally reviewed CT Head without contrast(Personally reviewed): 1. Chronic encephalomalacia changes in the right medial temporal and occipital lobes developed in the interim. 2. Unchanged small calcified meningioma along left frontal convexity.   MRI Brain(Personally reviewed): 1. Similar evolving infarcts in the anterior and posterior circulation. 2. No new infarcts, progressive mass effect or acute hemorrhage.   Repeat MRI Brain 12/7 1. Evolving infarcts without new/interval acute abnormality. 2. No evidence of metastatic disease.   MRI C spine 12/7 1. No evidence of metastatic disease. 2. Mild foraminal stenosis on the left at C3-C4 and right at C5-C6 and C6-C7.  Assessment   Briauna Gilmartin is  a 63 y.o. 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  Cervical spine imaging also unremarkable for acute process. At this time, stiffness on her exam and altered mental status has an unclear etiology but differentials include serotonin syndrome due to being on Prozac  versus side effect from the PD-1 inhibitor pembrolizumab , which is being used to treat her cancer.  MRI is negative for acute strokes-she has had old strokes that are now resolving. Her CSF results do not favor acute inflammatory demyelinating polyneuropathy/Guillain-Barr type of pathology.  PD-1 inhibitor induced myasthenia and myositis remain in the differentials but with a nearly normal CK, myositis is less likely.  PD-1 inhibitor induced myasthenia still remains in the differential along with serotonin syndrome. She was only on a very low-dose of Prozac  which makes me think that serotonin syndrome  may not be the true culprit here. Treatment of the PD-1 antibody induced side effects-steroids are first-line followed by IVIG and plasma exchange. There was discussion of using Sinemet for the rigidity but I am not sure if the rigidity is truly from a parkinsonian etiology, so I am hesitant to introduce that medication to the mix.  Discussed with husband and the care team in detail about starting steroids, to which the husband agreed.  IV methylprednisone 1 g daily for 3 days has been ordered since yesterday.   Recommendations  Complete 3 days of high-dose IV methylprednisone Supportive care per primary team as you are Will continue to follow ______________________________________________________________________   Signed, Eligio Lav, MD [facility]

## 2024-08-12 NOTE — Progress Notes (Signed)
 Occupational Therapy Treatment Patient Details Name: Brenda [name] MRN: [id number] DOB: July 05, 1961 Today's Date: 08/12/2024   History of present illness 63 yo female who returned to hospital on 07/15/2024 with ongoing signs and symptoms of UTI with generalized weakness. Pt with 4 seizures on EEG 11/16. Working diagnosis of cefepime  neurotoxicity. Prior admit 11/9 with urinary symptoms and AMS d/c home on OP ABX. PMH includes but is not limited to: HTN, HLD, DM, depression/anxiety, CVA (CIR recently), DVT/PE, OSA on CPAP, chronic respiratory failure on 2 L/min at baseline, and metastatic endometrial cancer with chronic cancer-related pain.   OT comments  Patient received in supine with daughter present.  PROM/AAROM to BUE shoulder, elbows, and hands performed to address tone. Patient's daughter was provided caregiver education on ROM and demonstrated good understanding. BUE palm guards provided and patient's daughter was educated on donning.  Patient will benefit from continued inpatient follow up therapy, <3 hours/day.  Acute OT to continue to follow to address established goals to facilitate DC to next venue of care.        If plan is discharge home, recommend the following:  Two people to help with walking and/or transfers;Two people to help with bathing/dressing/bathroom;Assistance with cooking/housework;Assistance with feeding;Direct supervision/assist for medications management;Direct supervision/assist for financial management;Assist for transportation;Help with stairs or ramp for entrance;Supervision due to cognitive status   Equipment Recommendations  Hospital bed;Hoyer lift    Recommendations for Other Services      Precautions / Restrictions Precautions Precautions: Fall;Other (comment) Recall of Precautions/Restrictions: Impaired Precaution/Restrictions Comments: Cortrak, skin integrity Restrictions Weight Bearing Restrictions Per Provider Order: No       Mobility Bed  Mobility                    Transfers                         Balance                                           ADL either performed or assessed with clinical judgement   ADL Overall ADL's : Needs assistance/impaired                                       General ADL Comments: focused on BUE ROM and caregiver training    Extremity/Trunk Assessment              Vision       Perception     Praxis     Communication Communication Communication: Impaired Factors Affecting Communication: Difficulty expressing self   Cognition Arousal: Lethargic (speaking occasional) Behavior During Therapy: Flat affect Cognition: Cognition impaired   Orientation impairments: Time, Situation Awareness: Intellectual awareness impaired Memory impairment (select all impairments): Short-term memory, Working memory Attention impairment (select first level of impairment): Focused attention Executive functioning impairment (select all impairments): Reasoning, Problem solving, Sequencing OT - Cognition Comments: eyes closed but would open when name called                 Following commands: Impaired Following commands impaired: Follows one step commands inconsistently      Cueing   Cueing Techniques: Verbal cues, Tactile cues  Exercises Exercises: General Upper Extremity General Exercises - Upper Extremity Shoulder Flexion:  Both, Supine, AAROM, 5 reps Shoulder ABduction: AAROM, PROM, Both, 10 reps, Supine Shoulder Horizontal ABduction: PROM, AAROM, Both, 10 reps, Supine Elbow Flexion: PROM, Both, Supine, 5 reps Elbow Extension: PROM, Both, Supine, 5 reps Wrist Flexion: PROM, Both, Supine, 5 reps Wrist Extension: PROM, Both, Supine, 5 reps Digit Composite Flexion: PROM, Both, Supine, 5 reps Composite Extension: PROM, Both, Supine, 5 reps    Shoulder Instructions       General Comments Patient's daughter present and  participated in ROM to BUE and donning of palm guard.  BUE palm guards provided and nursing notified    Pertinent Vitals/ Pain       Pain Assessment Pain Assessment: Faces Faces Pain Scale: Hurts a little bit Pain Location: BUE with ROM Pain Descriptors / Indicators: Grimacing Pain Intervention(s): Limited activity within patient's tolerance, Monitored during session, Repositioned  Home Living                                          Prior Functioning/Environment              Frequency  Min 1X/week        Progress Toward Goals  OT Goals(current goals can now be found in the care plan section)  Progress towards OT goals: Progressing toward goals  Acute Rehab OT Goals Patient Stated Goal: to assist more with ROM OT Goal Formulation: With family Time For Goal Achievement: 08/19/24 Potential to Achieve Goals: Fair ADL Goals Pt Will Perform Grooming: with mod assist;bed level Pt Will Perform Lower Body Bathing: with contact guard assist;sit to/from stand Pt Will Perform Lower Body Dressing: with contact guard assist;sitting/lateral leans;with adaptive equipment Pt Will Transfer to Toilet: with supervision;bedside commode;ambulating Pt Will Perform Toileting - Clothing Manipulation and hygiene: with contact guard assist;sit to/from stand Pt/caregiver will Perform Home Exercise Program: Increased ROM;Both right and left upper extremity;With minimal assist Additional ADL Goal #1: Pt will maintain wakefulness for 50% of the time during OT sessions to improve overall engagement. Additional ADL Goal #2: Pt/caregiver will  Plan      Co-evaluation                 AM-PAC OT 6 Clicks Daily Activity     Outcome Measure   Help from another person eating meals?: Total Help from another person taking care of personal grooming?: A Lot Help from another person toileting, which includes using toliet, bedpan, or urinal?: Total Help from another person  bathing (including washing, rinsing, drying)?: Total Help from another person to put on and taking off regular upper body clothing?: Total Help from another person to put on and taking off regular lower body clothing?: Total 6 Click Score: 7    End of Session    OT Visit Diagnosis: Muscle weakness (generalized) (M62.81);Low vision, both eyes (H54.2);Feeding difficulties (R63.3);Cognitive communication deficit (R41.841) Symptoms and signs involving cognitive functions: Cerebral infarction Pain - part of body: Shoulder   Activity Tolerance Patient limited by lethargy   Patient Left in bed;with call bell/phone within reach;with bed alarm set;with family/visitor present   Nurse Communication Other (comment) (BUE palm guards)        Time: 8881-8867 OT Time Calculation (min): 14 min  Charges: OT General Charges $OT Visit: 1 Visit OT Treatments $Therapeutic Exercise: 8-22 mins  Dick Laine, OTA Acute Rehabilitation Services  Office [phone]   Jeb LITTIE Laine 08/12/2024, 2:18 PM

## 2024-08-12 NOTE — Plan of Care (Signed)

## 2024-08-12 NOTE — Progress Notes (Signed)
 IP PROGRESS NOTE  Subjective:   Ms. Kennebrew was alert when I saw her at approximately 6:45 AM.  She reports stable pain.  Her daughter was at the bedside.    Objective: Vital signs in last 24 hours: Blood pressure (!) 156/71, pulse 80, temperature 98.6 F (37 C), temperature source Oral, resp. rate 18, height 5' 2 (1.575 m), weight 186 lb (84.4 kg), last menstrual period 10/30/2014, SpO2 100%.  Intake/Output from previous day: 12/09 0701 - 12/10 0700 In: 450 [NG/GT:450] Out: 1250 [Urine:1250]  Physical Exam:  Neurologic: Alert, has fluent speech, weak bilateral grip strength, she can hold her arms against gravity.  Minimal toe movement.   Portacath/PICC-without erythema  Lab Results: Recent Labs    08/11/24 0540 08/12/24 0300  WBC 8.2 7.9  HGB 9.6* 10.6*  HCT 30.0* 33.2*  PLT 277 290    BMET Recent Labs    08/11/24 0540 08/12/24 0300  NA 137 134*  K 4.3 4.2  CL 101 100  CO2 25 28  GLUCOSE 103* 278*  BUN 16 17  CREATININE 0.66 0.58  CALCIUM  8.5* 8.5*     Medications: I have reviewed the patient's current medications.  Assessment/Plan:  Endometrial cancer Diagnosed in 2023, status post surgery and radiation Recurrent disease July 2025 with a left retroperitoneal mass, biopsy confirmed adenocarcinoma, ER positive, MSI-high, tumor mutation burden 24, PD-L1 75%, PIK 3CA mutated, PTEN mutated, HER2 1+, loss of MLH1 expression 04/03/2024 CT chest: Spiculated 9 x 12 mm right upper lobe and part solid 4 x 6 mm right upper lobe nodules Cycle 1 paclitaxel /carboplatin /pembrolizumab  05/01/2024 Cycle 2 paclitaxel /carboplatin /pembrolizumab  06/30/2024 06/19/2024 CT abdomen/pelvis: Right lower lobe pulmonary embolism, splenic infarct, stable size of the left psoas mass with evidence of erosive change at the anterior aspect of L2, decreased size of left external iliac node 08/09/2024 CTs: New psoas muscle mass (tumor versus hematoma), left psoas/paraspinal mass has decreased  in size, by my review the previously noted lung nodules have resolved  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  DVT 06/02/2024 4.  Pulmonary embolism noted on CT chest 06/19/2024 5.  Diabetes 6.  CHF/CAD 7.  Seizures, cefepime  toxicity?  November 2025 8.  Progressive generalized loss of motor function-related to CVAs?,  Paraneoplastic? 08/11/2024-Solu-Medrol  9.  Respiratory failure 10.  Pain secondary to #1 11.  Anemia 12.  Elevated liver enzymes  Ms. Bragdon has a history of endometrial cancer initially diagnosed in 2020.  She was diagnosed with recurrent disease involving a left retroperitoneal mass in July 2025.  She completed 2 treatments with paclitaxel /carboplatin /pembrolizumab .  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  She completed 2 cycles of systemic therapy for treatment of the uterine cancer.  I reviewed the restaging CT findings with Ms. Medico and her daughter.  The known malignancy at the left retroperitoneum has improved significantly.  The significance of the right psoas mass is unclear.  I suspect this is a benign finding.      The etiology of the diffuse generalized weakness is unclear.  There is likely a component related to the embolic CVAs and deconditioning, but the evolution of the loss of motor function over the past several weeks appears to be more severe than expected.  She has  undergone an extensive neurologic evaluation over the past month.  She has been evaluated by neurology over the past few days.  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  They recommended trial of high-dose Solu-Medrol .  She started Solu-Medrol   last night.  I explained the reasoning for the trial of steroids to miss Radick and her daughter.  They understand steroids can diminish the effectiveness of immunotherapy.  I agree with a trial of steroids in her case.   The etiology of her pain is unclear based on the restaging CT.  The retroperitoneal mass is much smaller.  She could be having pain from the right psoas lesion or another etiology.  Recommendations: Continue narcotic analgesics as needed for pain Solu-Medrol  as recommended by neurology Anticoagulation for the DVT/PE and CVAs, will need to consider holding anticoagulation if the hemoglobin drops significantly  tube feedings, follow-up with speech pathology Seizure management per neurology Elevated liver enzymes-evaluate per the medical service Systemic treatment for the uterine cancer will remain on hold            LOS: 26 days   Arley Hof, MD   08/12/2024, 1:57 PM

## 2024-08-13 DIAGNOSIS — C541 Malignant neoplasm of endometrium: Secondary | ICD-10-CM | POA: Diagnosis not present

## 2024-08-13 DIAGNOSIS — R569 Unspecified convulsions: Secondary | ICD-10-CM | POA: Diagnosis not present

## 2024-08-13 DIAGNOSIS — Z794 Long term (current) use of insulin: Secondary | ICD-10-CM | POA: Diagnosis not present

## 2024-08-13 DIAGNOSIS — Z515 Encounter for palliative care: Secondary | ICD-10-CM | POA: Diagnosis not present

## 2024-08-13 DIAGNOSIS — Z7189 Other specified counseling: Secondary | ICD-10-CM | POA: Diagnosis not present

## 2024-08-13 LAB — GLUCOSE, CAPILLARY
Glucose-Capillary: 199 mg/dL — ABNORMAL HIGH (ref 70–99)
Glucose-Capillary: 209 mg/dL — ABNORMAL HIGH (ref 70–99)
Glucose-Capillary: 230 mg/dL — ABNORMAL HIGH (ref 70–99)
Glucose-Capillary: 231 mg/dL — ABNORMAL HIGH (ref 70–99)
Glucose-Capillary: 248 mg/dL — ABNORMAL HIGH (ref 70–99)
Glucose-Capillary: 269 mg/dL — ABNORMAL HIGH (ref 70–99)
Glucose-Capillary: 278 mg/dL — ABNORMAL HIGH (ref 70–99)

## 2024-08-13 LAB — MAGNESIUM: Magnesium: 2.1 mg/dL (ref 1.7–2.4)

## 2024-08-13 LAB — CBC WITH DIFFERENTIAL/PLATELET
Abs Immature Granulocytes: 0.03 K/uL (ref 0.00–0.07)
Basophils Absolute: 0 K/uL (ref 0.0–0.1)
Basophils Relative: 0 %
Eosinophils Absolute: 0 K/uL (ref 0.0–0.5)
Eosinophils Relative: 0 %
HCT: 31.2 % — ABNORMAL LOW (ref 36.0–46.0)
Hemoglobin: 10 g/dL — ABNORMAL LOW (ref 12.0–15.0)
Immature Granulocytes: 0 %
Lymphocytes Relative: 9 %
Lymphs Abs: 0.7 K/uL (ref 0.7–4.0)
MCH: 29.2 pg (ref 26.0–34.0)
MCHC: 32.1 g/dL (ref 30.0–36.0)
MCV: 91 fL (ref 80.0–100.0)
Monocytes Absolute: 0.2 K/uL (ref 0.1–1.0)
Monocytes Relative: 2 %
Neutro Abs: 6.6 K/uL (ref 1.7–7.7)
Neutrophils Relative %: 89 %
Platelets: 321 K/uL (ref 150–400)
RBC: 3.43 MIL/uL — ABNORMAL LOW (ref 3.87–5.11)
RDW: 19.7 % — ABNORMAL HIGH (ref 11.5–15.5)
WBC: 7.5 K/uL (ref 4.0–10.5)
nRBC: 0 % (ref 0.0–0.2)

## 2024-08-13 LAB — COMPREHENSIVE METABOLIC PANEL WITH GFR
ALT: 123 U/L — ABNORMAL HIGH (ref 0–44)
AST: 33 U/L (ref 15–41)
Albumin: 2.4 g/dL — ABNORMAL LOW (ref 3.5–5.0)
Alkaline Phosphatase: 213 U/L — ABNORMAL HIGH (ref 38–126)
Anion gap: 6 (ref 5–15)
BUN: 31 mg/dL — ABNORMAL HIGH (ref 8–23)
CO2: 29 mmol/L (ref 22–32)
Calcium: 8.7 mg/dL — ABNORMAL LOW (ref 8.9–10.3)
Chloride: 100 mmol/L (ref 98–111)
Creatinine, Ser: 0.46 mg/dL (ref 0.44–1.00)
GFR, Estimated: >60 mL/min (ref >60)
Glucose, Bld: 227 mg/dL — ABNORMAL HIGH (ref 70–99)
Potassium: 4.1 mmol/L (ref 3.5–5.1)
Sodium: 135 mmol/L (ref 135–145)
Total Bilirubin: 0.6 mg/dL (ref 0.0–1.2)
Total Protein: 6.3 g/dL — ABNORMAL LOW (ref 6.5–8.1)

## 2024-08-13 LAB — PHOSPHORUS: Phosphorus: 3 mg/dL (ref 2.5–4.6)

## 2024-08-13 LAB — AMMONIA: Ammonia: 27 umol/L (ref 9–35)

## 2024-08-13 NOTE — Progress Notes (Signed)
 [facility]  PROGRESS NOTE  Brenda [name] FMW:[id number] DOB: 12/30/1960 DOA: 07/15/2024 PCP: [facility]   Brief HPI:   63 year old female with history of metastatic endometrial cancer, chronic cancer-related pain, hypertension, hyperlipidemia, diabetes mellitus, depression, anxiety, CVA, history of DVT and PE anticoagulated on Lovenox , OSA, chronic hypoxic respiratory failure on 2L via nasal cannula and recent hospitalization and discharged on 07/14/2024 for UTI treated with IV antibiotics followed by Brenda antibiotics on discharge presented with chills and weakness.  She was found to have UTI and was started on IV cefepime .  Unfortunately mental status worsened and she was transferred to [facility] for continuous EEG monitoring with 4 seizures noted on video EEG. Started on Keppra  11/16. Placed on empiric broad-spectrum antibiotics for possible meningitis/encephalitis which was ruled out based on CSF results. As per husband, mental status worsened since antibiotics stopped. Not able to participate with any meaningful history and following only few commands.    No improvement in mentation, developed some loose stool that was attributed to tube feed formula with no concern for infection. Nothing much to offer from oncology and they advised palliative care. Family meeting with palliative and they want her to be transferred to a tertiary care center.  Discussed with her oncologist as we do not have any diagnosis which cannot be treated at current facility, transfer will not happen for a second opinion.  Per oncology, family need to arrange themself to take her to any tertiary care center of their choice , apparently has an outpatient appointment with [facility] oncology in December. Husband still hopeful the patient will improve in terms of mental status.       Assessment/Plan:   Acute metabolic encephalopathy Subclinical seizures -Mental status has improved -Likely in setting  of cefepime  causing worsening mental status; cefepime  discontinued - On 11/15, stiffening of upper extremities, patient transferred to [facility] for continuous video EEG. MRI brain on 11/15 is negative for acute findings.  Shows evolution of ischemic infarcts similar to before. Video EEG: 4 seizures noted on 11/16. Status post LP with acute meningitis/encephalitis panel negative and transient empiric antibiotics   - concern of some type of paraneoplastic with her history of advanced malignancy. 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, started on high-dose Solu-Medrol  on 08/11/2024   Dysphagia -Swallow evaluation obtained, underwent MBS -Started on dysphagia 1 diet   Depression/anxiety - Prozac  has been discontinued due to rigidity   Acute on chronic anemia Iron  deficiency - patient has history of anemia of chronic disease - Hgb 8 < 6.8, baseline 7-10 prior to this prolonged hospitalization - No signs/symptoms of acute bleeding - continue to hold therapeutic lovenox  - given 1 unit pRBC on 12/1; Hgb goal > 7 - iron  studies concerning for iron  deficiency - plan for IV iron  for 5 day couse to end 12/5 - may need GI consult if continues to have worsening anemia despite transfusion and IV iron    Endometrial cancer with metastases Cancer related pain -Appreciate Dr. Andriette input CT chest/abdomen/pelvis for persistent (worsening?) back pain (note centrally necrotic infiltrative mass in L psoas in 06/2024) CT 12/7 with R psoas muscle mass like enlargement measuring 5.0 x 4.6 cm indeterminate for tumor vs hematoma (recommended correlation with clinical status and possible contrast enhanced mri or short interval follow imaging  - Hb is stable, consider repeat imaging in a week or so) - L psoas/paraspinal mass decreased  in size.   Appreciate oncology needs Will continue to adjust pain meds    Acute RLL  segmental PE Splenic infarct - acute PE diagnosed in 06/19/2024 - splenic infarct diagnosed in 05/2024 - resume Lovenox  given Hgb stable and no signs/symptoms for acute bleed - monitor CBC    History of CAD History of stroke Hyperlipidemia -Continue statin.  Not on aspirin  since she is to be on treatment dose anticoagulation - resumedLovenox given Hgb stable and no signs/symptoms for acute bleed   Hypernatremia   Resolved with IV/PO hydration.    UTI:  Present on admission, failed p.o. antibiotics - Blood and urine cultures negative so far.  - Antibiotics were discontinued.   Hypophosphatemia Hypokalemia  -replete   Hyperthyroidism TSH is 0.23,  free T4 level elevated 1.4.  Mild suppression of TSH.  Likely from ongoing illness.   1.  Left: Patent.  Antithyroid medications are not indicated at this time.   - cont propranolol  to help with tremors.    Chronic respiratory failure with hypoxia OSA - Respiratory status stable.  - Continue supplemental oxygen .  - Continue CPAP at night   Hypoglycemia Diabetes mellitus type 2  - resolved with D50 - cont semglee  25mg  BID  - Continue with Lantus  and SSI   Obesity class I - Outpatient follow-up   Pressure injury to sacrum stage 2, not present on admission  - cont local wound care as per wound care team    Physical deconditioning - Likely will need SNF pending her clinical course.    Goals of care - Palliative care following - Appreciate Dr. Kathaleen input, she is not a candidate for further treatment for uterine cancer until her performance status improves    DVT prophylaxis: Lovenox   Medications     atorvastatin   80 mg Per Tube QHS   baclofen   5 mg Per Tube BID   Chlorhexidine  Gluconate Cloth  6 each Topical Daily   enoxaparin  (LOVENOX ) injection  1 mg/kg Subcutaneous Q12H   feeding supplement  237 mL Brenda Q24H   feeding supplement (PROSource TF20)  60 mL Per Tube Daily   free water   75 mL Per Tube Q4H    insulin  aspart  0-20 Units Subcutaneous Q4H   insulin  glargine  25 Units Subcutaneous BID   levETIRAcetam   500 mg Per Tube BID   lidocaine   1 patch Transdermal Daily   oxyCODONE   10 mg Brenda QHS   pantoprazole  (PROTONIX ) IV  40 mg Intravenous Q24H   propranolol   20 mg Per Tube TID   sodium chloride  flush  10-40 mL Intracatheter Q12H     Data Reviewed:   CBG:  Recent Labs  Lab 08/12/24 2020 08/12/24 2349 08/13/24 0329 08/13/24 0733 08/13/24 1213  GLUCAP 323* 278* 231* 199* 269*    SpO2: 100 % O2 Flow Rate (L/min): 2 L/min FiO2 (%): 21 %    Vitals:   08/12/24 2352 08/13/24 0332 08/13/24 0759 08/13/24 1108  BP: (!) 140/87 (!) 143/76 126/62 (!) 156/44  Pulse: 71 63 88 69  Resp:   15 19  Temp: 98.1 F (36.7 C) 98 F (36.7 C) 98.7 F (37.1 C) 98.6 F (37 C)  TempSrc: Brenda Brenda Brenda Brenda  SpO2: 100% 100% 100% 100%  Weight:      Height:          Data Reviewed:  Basic Metabolic Panel: Recent Labs  Lab 08/09/24 1221 08/10/24 0405 08/11/24 0540 08/12/24 0300 08/13/24 0525  NA 136 136  137 134* 135  K 4.0 4.3 4.3 4.2 4.1  CL 101 103 101 100 100  CO2 29 28 25 28 29   GLUCOSE 86 135* 103* 278* 227*  BUN 15 15 16 17  31*  CREATININE 0.49 0.37* 0.66 0.58 0.46  CALCIUM  8.5* 8.4* 8.5* 8.5* 8.7*  MG  --  1.8 1.9 1.9 2.1  PHOS  --  3.8 3.8 3.3 3.0    CBC: Recent Labs  Lab 08/09/24 1221 08/10/24 0405 08/11/24 0540 08/12/24 0300 08/13/24 0525  WBC 11.4* 8.6 8.2 7.9 7.5  NEUTROABS  --  6.2 6.0 7.6 6.6  HGB 10.1* 8.6* 9.6* 10.6* 10.0*  HCT 30.8* 27.1* 30.0* 33.2* 31.2*  MCV 87.3 89.7 89.8 89.7 91.0  PLT 324 266 277 290 321    LFT Recent Labs  Lab 08/09/24 1221 08/10/24 0405 08/11/24 0540 08/12/24 0300 08/13/24 0525  AST 150* 103* 124* 77* 33  ALT 168* 176* 210* 186* 123*  ALKPHOS 140* 193* 258* 255* 213*  BILITOT 0.7 0.6 0.6 0.7 0.6  PROT 6.2* 5.4* 6.2* 6.9 6.3*  ALBUMIN 2.4* 2.1* 2.4* 2.5* 2.4*     Antibiotics: Anti-infectives (From  admission, onward)    Start     Dose/Rate Route Frequency Ordered Stop   07/23/24 0600  vancomycin  (VANCOCIN ) IVPB 1000 mg/200 mL premix  Status:  Discontinued        1,000 mg 200 mL/hr over 60 Minutes Intravenous Every 12 hours 07/22/24 1834 07/24/24 1808   07/22/24 2200  meropenem  (MERREM ) 2 g in sodium chloride  0.9 % 100 mL IVPB  Status:  Discontinued        2 g 280 mL/hr over 30 Minutes Intravenous Every 8 hours 07/22/24 1601 07/24/24 1808   07/22/24 1700  vancomycin  (VANCOREADY) IVPB 1500 mg/300 mL        1,500 mg 150 mL/hr over 120 Minutes Intravenous  Once 07/22/24 1601 07/22/24 2031   07/22/24 1645  ampicillin  (OMNIPEN) 2 g in sodium chloride  0.9 % 100 mL IVPB  Status:  Discontinued        2 g 300 mL/hr over 20 Minutes Intravenous Every 6 hours 07/22/24 1557 07/24/24 1800   07/19/24 0530  meropenem  (MERREM ) 1 g in sodium chloride  0.9 % 100 mL IVPB  Status:  Discontinued        1 g 200 mL/hr over 30 Minutes Intravenous Every 8 hours 07/19/24 0438 07/22/24 1601   07/16/24 0900  ceFEPIme  (MAXIPIME ) 2 g in sodium chloride  0.9 % 100 mL IVPB  Status:  Discontinued        2 g 200 mL/hr over 30 Minutes Intravenous Every 8 hours 07/16/24 0811 07/19/24 0409   07/16/24 0815  ceFEPIme  (MAXIPIME ) 2 g in sodium chloride  0.9 % 100 mL IVPB  Status:  Discontinued        2 g 200 mL/hr over 30 Minutes Intravenous  Once 07/16/24 0803 07/16/24 0811   07/16/24 0330  cefTRIAXone  (ROCEPHIN ) 2 g in sodium chloride  0.9 % 100 mL IVPB        2 g 200 mL/hr over 30 Minutes Intravenous  Once 07/16/24 0315 07/16/24 0412        CONSULTS-palliative care, neurology  Code Status: DNR  Family Communication: Discussed with patient's husband at bedside     Subjective     Objective    Physical Examination:      Wound 07/25/24 1100 Pressure Injury Sacrum Medial Deep Tissue Pressure Injury - Purple or maroon localized area of discolored intact  skin or blood-filled blister due to damage of  underlying soft tissue from pressure and/or shear. (Active)     Wound 08/11/24 0415 Pressure Injury Vertebral column Medial Stage 2 -  Partial thickness loss of dermis presenting as a shallow open injury with a red, pink wound bed without slough. (Active)        Brenda [name]   [facility] If 7PM-7AM, please contact night-coverage at [email or web address], Office  [phone]   08/13/2024, 3:04 PM  LOS: 27 days

## 2024-08-13 NOTE — Progress Notes (Signed)
 SLP Cancellation Note  Patient Details Name: Brenda [name] MRN: [id number] DOB: Feb 01, 1961   Cancelled treatment:        Pt sleeping soundly and dtr stated she has been tired today and requested not to wake. Stated she ate small amount of soup earlier. Gave soda via spoon (did not have a straw) and she coughed and dtr stopped feeding as it was the end of the meal and likely tired. Explained that pt may have intermittent instances of coughing and decreased airway protection if she is weaker or more tired. She did not state this is happening frequently. ST will continue attempts tomorrow as schedule allows.    Dustin Olam Bull 08/13/2024, 1:27 PM

## 2024-08-13 NOTE — Progress Notes (Signed)
 IP PROGRESS NOTE  Subjective:   Brenda [name] was less alert when I saw her at approximately 6:45 AM.  Her daughter was at the bedside.  Her daughter reports she was more alert and talkative during the night.    Objective: Vital signs in last 24 hours: Blood pressure (!) 156/44, pulse 69, temperature 98.6 F (37 C), temperature source Oral, resp. rate 19, height 5' 2 (1.575 m), weight 186 lb (84.4 kg), last menstrual period 10/30/2014, SpO2 100%.  Intake/Output from previous day: 12/10 0701 - 12/11 0700 In: 516 [NG/GT:450; IV Piggyback:66] Out: 1200 [Urine:1200]  Physical Exam:  Neurologic: Alert, speaks a few words, weak bilateral grip strength, she can hold her arms against gravity.  Minimal toe movement.  Constant tremors of the arms and hands  Portacath/PICC-without erythema  Lab Results: Recent Labs    08/12/24 0300 08/13/24 0525  WBC 7.9 7.5  HGB 10.6* 10.0*  HCT 33.2* 31.2*  PLT 290 321    BMET Recent Labs    08/12/24 0300 08/13/24 0525  NA 134* 135  K 4.2 4.1  CL 100 100  CO2 28 29  GLUCOSE 278* 227*  BUN 17 31*  CREATININE 0.58 0.46  CALCIUM  8.5* 8.7*     Medications: I have reviewed the patient's current medications.  Assessment/Plan:  Endometrial cancer Diagnosed in 2023, status post surgery and radiation Recurrent disease July 2025 with a left retroperitoneal mass, biopsy confirmed adenocarcinoma, ER positive, MSI-high, tumor mutation burden 24, PD-L1 75%, PIK 3CA mutated, PTEN mutated, HER2 1+, loss of MLH1 expression 04/03/2024 CT chest: Spiculated 9 x 12 mm right upper lobe and part solid 4 x 6 mm right upper lobe nodules Cycle 1 paclitaxel /carboplatin /pembrolizumab  05/01/2024 Cycle 2 paclitaxel /carboplatin /pembrolizumab  06/30/2024 06/19/2024 CT abdomen/pelvis: Right lower lobe pulmonary embolism, splenic infarct, stable size of the left psoas mass with evidence of erosive change at the anterior aspect of L2, decreased size of left external  iliac node 08/09/2024 CTs: New psoas muscle mass (tumor versus hematoma), left psoas/paraspinal mass has decreased in size, by my review the previously noted lung nodules have resolved  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  DVT 06/02/2024 4.  Pulmonary embolism noted on CT chest 06/19/2024 5.  Diabetes 6.  CHF/CAD 7.  Seizures, cefepime  toxicity?  November 2025 8.  Progressive generalized loss of motor function-related to CVAs?,  Paraneoplastic? 08/11/2024-Solu-Medrol  9.  Respiratory failure 10.  Pain secondary to #1 11.  Anemia 12.  Elevated liver enzymes-improved  Brenda [name] has a history of endometrial cancer initially diagnosed in 2020.  She was diagnosed with recurrent disease involving a left retroperitoneal mass in July 2025.  She completed 2 treatments with paclitaxel /carboplatin /pembrolizumab .  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  She completed 2 cycles of systemic therapy for treatment of the uterine cancer.  I reviewed the restaging CT findings with Brenda [name] and her daughter.  The known malignancy at the left retroperitoneum has improved significantly.  The significance of the right psoas mass is unclear.  I suspect this is a benign finding.      The etiology of the diffuse generalized weakness is unclear.  There is likely a component related to the embolic CVAs and deconditioning, but the evolution of the  loss of motor function over the past several weeks appears to be more severe than expected.  She has undergone an extensive neurologic evaluation over the past month.  She has been evaluated by neurology over the past few days.  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  She started high-dose Solu-Medrol  on 08/11/2024.  I will review the case with Dr. Lonn at her thoughts regarding Brenda [name]'s neurologic status.  She has tremors in the arms and hands this morning.  She will be evaluated by neurology..   The etiology of her pain is unclear based on the restaging CT.  The retroperitoneal mass is much smaller.  She could be having pain from the right psoas lesion or another etiology.  Her pain appears improved over the past few days.  She has taken morphine  infrequently.  The hemoglobin is stable.  I reviewed the case by secure chat with the neurology service.  Recommendations: Continue narcotic analgesics as needed for pain Solu-Medrol  as recommended by neurology Anticoagulation for the DVT/PE and CVAs, will need to consider holding anticoagulation if the hemoglobin drops significantly  tube feedings, follow-up with speech pathology Seizure management, evaluation of tremors per neurology Elevated liver enzymes-evaluate per the medical service Systemic treatment for the uterine cancer will remain on hold            LOS: 27 days   Arley Hof, MD   08/13/2024, 1:29 PM

## 2024-08-13 NOTE — Progress Notes (Signed)
 Daily Progress Note   Date: 08/13/2024   Patient Name: Brenda [name]  DOB: Jun 18, 1961  MRN: [id number]  Age / Sex: 63 y.o., female  Attending Physician: Drusilla Sabas RAMAN, MD Primary Care Physician: [facility] Admit Date: 07/15/2024 Length of Stay: 27 days  Reason for Follow-up: Establishing goals of care  Past Medical History:  Diagnosis Date   Anemia    Arthritis    back, hands, hips (02/22/2015)   CAD (coronary artery disease)    a. complex LAD/diagonal bifurcation PCI in 2010. b. STEMI 10/2019 s/p PTCA/DES x1 to mLAD overlapping the old stent, residual disease treated medicaly.   Cancer [facility])    uterine   CHF (congestive heart failure) (HCC)    Depression    Diabetes mellitus (HCC)    started when I was pregnant; not sure if it was type 1 or type 2    History of radiation therapy    Endometrium- HDR 01/22/22-03/07/22- Dr. Lynwood Nasuti   Hypercholesterolemia    Hypertension    Morbid obesity (HCC)    Myocardial infarction (HCC)    mild x 3   Neuromuscular disorder (HCC)    neuropathy feet   Sleep apnea    cpap/    Assessment & Plan:   HPI/Patient Profile:   63 y.o. 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   Palliative medicine consulted for goals of care conversation.  SUMMARY OF RECOMMENDATIONS DNR limited Continue efforts to improve mentation Symptom management as below  Symptom Management:  Oxycodone  10 mg daily at bedtime for pain Oxycodone  2.5 mg Q3 PRN for pain  Code Status: DNR - Limited (DNR/DNI)  Prognosis: Unable to determine  Discharge Planning: To Be Determined  Discussed with: Lama MD 08/13/2024 about family's continued plan to work up poor mentation with hopes to pursue chemotherapy if able.   Subjective:   Subjective: Chart Reviewed.  Updates received. Patient Assessed. Created space and opportunity for patient  and family to explore thoughts and feelings regarding current medical situation.  Today's Discussion:  Patient was somnolent during visit. Met with patient with daughter [name]) at the bedside. Discussed overall plans over the last few days. Reviewed that neurology is considering PD-1 inhibitor induced myasthenia gravis from pembrolizumab  and the plan was to administer 3 days of steroids and assess. Also reviewed with daughter about possible Sinemet for parkinsonian features and plasmapheresis for the patients continued rigidity and tremors. Daughter shares that it has been an emotional roller coaster despite some periods of medical stagnation over the last couple of weeks with lack of progress in work up of the patient's mentation. Daughter shares that her son and her younger brother are enrolled in therapy sessions to help with processing the health difficulties the patient has been experiencing. Reviewed imaging results that showed decrease in size of metastatic cancer nodules which has renewed hope in the family to continue to pursue chemotherapy when mentation improves.   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     Comments: Somnolent.   HENT:     Head: Normocephalic.     Nose: Nose normal.   Cardiovascular:     Rate and Rhythm: Normal rate.  Pulmonary:     Effort: Pulmonary effort is normal.  Abdominal:     Palpations: Abdomen is soft.  Skin:    General: Skin is warm.  Neurological:     General: No focal deficit present.     Palliative Assessment/Data: 10%   Existing Vynca/ACP Documentation: None  Thank you for allowing us  to participate in the care of Brenda [name] PMT will continue to support holistically.  I personally spent a total of 35 minutes in the care of the patient today including preparing to see the patient, performing a medically appropriate exam/evaluation, counseling and educating, referring and communicating with other health care professionals, and documenting clinical information in the EHR.  [name]  Palliative Medicine Team  Team Phone # [phone] (Nights/Weekends) 08/13/2024 1:58 PM

## 2024-08-13 NOTE — Plan of Care (Signed)

## 2024-08-13 NOTE — Progress Notes (Signed)
 NEUROLOGY CONSULT FOLLOW UP NOTE   Date of service: August 13, 2024 Patient Name: Brenda [name] MRN:  [id number] DOB:  25-May-1961  Interval Hx/subjective  Patient seen and examined. Daughter at bedside More drowsy this AM Has prominent resting tremor Rigidity with some improvement  Vitals   Vitals:   08/12/24 2022 08/12/24 2352 08/13/24 0332 08/13/24 0759  BP: (!) 146/64 (!) 140/87 (!) 143/76 126/62  Pulse: 72 71 63 88  Resp:    15  Temp: 98.2 F (36.8 C) 98.1 F (36.7 C) 98 F (36.7 C) 98.7 F (37.1 C)  TempSrc: Oral Oral Oral Oral  SpO2: 100% 100% 100% 100%  Weight:      Height:         Body mass index is 34.02 kg/m.  Physical Exam   General: Awake, alert and oriented to self. HEENT: Alopecia and facial hirsutism noted CVS: Regular rhythm Abdomen nondistended nontender Neurological exam More drowsy today Follow simple commands but not as swiftly as she did yesterday No gross cranial nerve deficits On motor examination, she does have increased tone-today there was more cogwheeling noted in the upper extremities as well but the increased tone is actually better than before. Sensation appears intact to light touch    Medications  Current Facility-Administered Medications:    artificial tears (LACRILUBE) ophthalmic ointment, , Both Eyes, Q4H PRN, Cosette Blackwater, MD, Given at 08/11/24 2102   atorvastatin  (LIPITOR ) tablet 80 mg, 80 mg, Per Tube, QHS, Sigdel, Santosh, MD, 80 mg at 08/12/24 2122   baclofen  (LIORESAL ) tablet 5 mg, 5 mg, Per Tube, BID, Chen, Lydia D, RPH, 5 mg at 08/12/24 2122   Chlorhexidine  Gluconate Cloth 2 % PADS 6 each, 6 each, Topical, Daily, Chavez, Abigail, NP, 6 each at 08/12/24 9078   enoxaparin  (LOVENOX ) injection 80 mg, 1 mg/kg, Subcutaneous, Q12H, Tariq, Hassan, MD, 80 mg at 08/12/24 2121   feeding supplement (ENSURE PLUS HIGH PROTEIN) liquid 237 mL, 237 mL, Oral, Q24H, Drusilla, Gagan S, MD, 237 mL at 08/12/24 1435   feeding  supplement (JEVITY 1.5 CAL/FIBER) liquid 1,000 mL, 1,000 mL, Per Tube, Continuous, Cosette Blackwater, MD, Last Rate: 45 mL/hr at 08/12/24 1435, 1,000 mL at 08/12/24 1435   feeding supplement (PROSource TF20) liquid 60 mL, 60 mL, Per Tube, Daily, Sigdel, Santosh, MD, 60 mL at 08/12/24 0920   free water  75 mL, 75 mL, Per Tube, Q4H, Cosette Blackwater, MD, 75 mL at 08/13/24 0406   insulin  aspart (novoLOG ) injection 0-20 Units, 0-20 Units, Subcutaneous, Q4H, Perri DELENA Meliton Mickey., MD, 7 Units at 08/13/24 0405   insulin  glargine (LANTUS ) injection 25 Units, 25 Units, Subcutaneous, BID, Perri DELENA Meliton Mickey., MD, 25 Units at 08/12/24 2122   levETIRAcetam  (KEPPRA ) tablet 500 mg, 500 mg, Per Tube, BID, Chen, Lydia D, RPH, 500 mg at 08/12/24 2123   lidocaine  (LIDODERM ) 5 % 1 patch, 1 patch, Transdermal, Daily, Alekh, Kshitiz, MD, 1 patch at 08/11/24 1120   loperamide  HCl (IMODIUM ) 1 MG/7.5ML suspension 2 mg, 2 mg, Per Tube, PRN, Merilee Linsey I, RPH, 2 mg at 08/05/24 9178   methylPREDNISolone  sodium succinate (SOLU-MEDROL ) 1,000 mg in sodium chloride  0.9 % 50 mL IVPB, 1,000 mg, Intravenous, Q24H, Jory Tanguma, MD, Last Rate: 66 mL/hr at 08/12/24 1721, 1,000 mg at 08/12/24 1721   morphine  (PF) 2 MG/ML injection 2 mg, 2 mg, Intravenous, Q2H PRN, 2 mg at 08/12/24 0318 **OR** morphine  (PF) 2 MG/ML injection 4 mg, 4 mg, Intravenous, Q2H PRN, Perri DELENA Meliton Mickey.,  MD, 4 mg at 08/12/24 2354   naloxone  (NARCAN ) injection 0.4 mg, 0.4 mg, Intravenous, PRN, Alfornia Madison, MD   ondansetron  (ZOFRAN ) tablet 4 mg, 4 mg, Per Tube, Q6H PRN **OR** ondansetron  (ZOFRAN ) injection 4 mg, 4 mg, Intravenous, Q6H PRN, Cosette Blackwater, MD   oxyCODONE  (Oxy IR/ROXICODONE ) immediate release tablet 10 mg, 10 mg, Oral, QHS, Cooper, Josseline P, PA-C, 10 mg at 08/12/24 2123   oxyCODONE  (Oxy IR/ROXICODONE ) immediate release tablet 2.5 mg, 2.5 mg, Per Tube, Q3H PRN, Cooper, Josseline P, PA-C, 2.5 mg at 08/11/24 1816   pantoprazole   (PROTONIX ) injection 40 mg, 40 mg, Intravenous, Q24H, Rogers Ditter, MD, 40 mg at 08/12/24 1651   prochlorperazine  (COMPAZINE ) tablet 10 mg, 10 mg, Oral, Q6H PRN, Foust, Katy L, NP   propranolol  (INDERAL ) tablet 20 mg, 20 mg, Per Tube, TID, Sigdel, Santosh, MD, 20 mg at 08/12/24 2122   senna-docusate (Senokot-S) tablet 1 tablet, 1 tablet, Oral, QHS PRN, Foust, Katy L, NP   sodium chloride  flush (NS) 0.9 % injection 10-40 mL, 10-40 mL, Intracatheter, Q12H, Sigdel, Santosh, MD, 10 mL at 08/12/24 2124   sodium chloride  flush (NS) 0.9 % injection 10-40 mL, 10-40 mL, Intracatheter, PRN, Mcarthur Pick, MD  Labs and Diagnostic Imaging   CBC:  Recent Labs  Lab 08/12/24 0300 08/13/24 0525  WBC 7.9 7.5  NEUTROABS 7.6 6.6  HGB 10.6* 10.0*  HCT 33.2* 31.2*  MCV 89.7 91.0  PLT 290 321    Basic Metabolic Panel:  Lab Results  Component Value Date   NA 135 08/13/2024   K 4.1 08/13/2024   CO2 29 08/13/2024   GLUCOSE 227 (H) 08/13/2024   BUN 31 (H) 08/13/2024   CREATININE 0.46 08/13/2024   CALCIUM  8.7 (L) 08/13/2024   GFRNONAA >60 08/13/2024   GFRAA 94 12/16/2019   Lipid Panel:  Lab Results  Component Value Date   LDLCALC 28 05/30/2024   HgbA1c:  Lab Results  Component Value Date   HGBA1C 7.2 (H) 06/01/2024   Urine Drug Screen:     Component Value Date/Time   LABOPIA POSITIVE (A) 04/20/2024 1817   COCAINSCRNUR NONE DETECTED 04/20/2024 1817   LABBENZ NONE DETECTED 04/20/2024 1817   AMPHETMU NONE DETECTED 04/20/2024 1817   THCU NONE DETECTED 04/20/2024 1817   LABBARB NONE DETECTED 04/20/2024 1817    Alcohol Level     Component Value Date/Time   ETH <15 04/20/2024 1344   INR  Lab Results  Component Value Date   INR 1.0 07/16/2024   APTT  Lab Results  Component Value Date   APTT 67 (H) 06/21/2024   Imaging personally reviewed CT Head without contrast(Personally reviewed): 1. Chronic encephalomalacia changes in the right medial temporal and occipital lobes  developed in the interim. 2. Unchanged small calcified meningioma along left frontal convexity.   MRI Brain(Personally reviewed): 1. Similar evolving infarcts in the anterior and posterior circulation. 2. No new infarcts, progressive mass effect or acute hemorrhage.   Repeat MRI Brain 12/7 1. Evolving infarcts without new/interval acute abnormality. 2. No evidence of metastatic disease.   MRI C spine 12/7 1. No evidence of metastatic disease. 2. Mild foraminal stenosis on the left at C3-C4 and right at C5-C6 and C6-C7.  Assessment   Brenda [name] is a 63 y.o. female past history of CAD, DM, obesity, OSA, metastatic endometrial cancer, prior stroke, DVT, PE on anticoagulation, recurrent UTIs with concern for cefepime  toxicity causing seizures for which she which she was started on Keppra  and cefepime   was discontinued, continues to have neurological worsening despite cessation of seizures and no new strokes on imaging.  Cervical spine imaging also unremarkable for acute process. At this time, stiffness on her exam and altered mental status has an unclear etiology but differentials include serotonin syndrome due to being on Prozac  versus side effect/neurotoxicity from the PD-1 inhibitor pembrolizumab , which is being used to treat her cancer.  MRI is negative for acute strokes-she has had old strokes that are now resolving. Her CSF results do not favor acute inflammatory demyelinating polyneuropathy/Guillain-Barr type of pathology.  PD-1 inhibitor induced myasthenia and myositis remain in the differentials but with a nearly normal CK, myositis is less likely.  PD-1 inhibitor induced myasthenia still remains in the differential along with serotonin syndrome. She was only on a very low-dose of Prozac  which makes me think that serotonin syndrome may not be the true culprit here. Treatment of the PD-1 antibody induced side effects-steroids are first-line followed by IVIG and plasma  exchange. Resting tremor which was more palpable today makes me want to definitely trial Sinemet-I discussed this with the family.  Impression: Altered mental status and tremors, evaluate for chemotherapy related neurotoxicity   Recommendations  Complete 3 days of high-dose IV methylprednisone Would prefer to watch her a day or so after the completion of steroids to see if there is any improvement Can consider plasma exchange as the next step. I would not want to do IVIG and then plasma exchange because that may lessen the efficacy of IVIG.  If we decide to go all the way with the exchange, I would jump straight to plasma exchange. I would trial Sinemet because of parkinsonian features noticed by me today and also mention by Dr. Sal Khaliqdina in the past. Supportive care per primary team as you are Will continue to follow  Plan discussed with Dr. Drusilla and Dr. Cloretta ______________________________________________________________________   Signed, Eligio Lav, MD [facility]

## 2024-08-13 NOTE — Plan of Care (Signed)
°  Problem: Fluid Volume: Goal: Ability to maintain a balanced intake and output will improve Outcome: Progressing   Problem: Education: Goal: Ability to describe self-care measures that may prevent or decrease complications (Diabetes Survival Skills Education) will improve Outcome: Not Progressing   Problem: Health Behavior/Discharge Planning: Goal: Ability to identify and utilize available resources and services will improve Outcome: Not Progressing

## 2024-08-14 ENCOUNTER — Inpatient Hospital Stay (HOSPITAL_COMMUNITY)

## 2024-08-14 ENCOUNTER — Other Ambulatory Visit: Payer: Self-pay | Admitting: Hematology and Oncology

## 2024-08-14 DIAGNOSIS — E870 Hyperosmolality and hypernatremia: Secondary | ICD-10-CM | POA: Diagnosis not present

## 2024-08-14 DIAGNOSIS — E118 Type 2 diabetes mellitus with unspecified complications: Secondary | ICD-10-CM | POA: Diagnosis not present

## 2024-08-14 DIAGNOSIS — J9611 Chronic respiratory failure with hypoxia: Secondary | ICD-10-CM | POA: Diagnosis not present

## 2024-08-14 LAB — GLUCOSE, CAPILLARY
Glucose-Capillary: 188 mg/dL — ABNORMAL HIGH (ref 70–99)
Glucose-Capillary: 190 mg/dL — ABNORMAL HIGH (ref 70–99)
Glucose-Capillary: 222 mg/dL — ABNORMAL HIGH (ref 70–99)
Glucose-Capillary: 235 mg/dL — ABNORMAL HIGH (ref 70–99)
Glucose-Capillary: 274 mg/dL — ABNORMAL HIGH (ref 70–99)
Glucose-Capillary: 281 mg/dL — ABNORMAL HIGH (ref 70–99)

## 2024-08-14 MED ORDER — ACETAMINOPHEN 160 MG/5ML PO SOLN: 500.0000 mg | Freq: Four times a day (QID) | ORAL | Status: DC | PRN

## 2024-08-14 MED ORDER — AMLODIPINE BESYLATE 5 MG PO TABS
10.0000 mg | ORAL_TABLET | Freq: Every day | ORAL | Status: DC
  Administered 2024-08-14 – 2024-09-02 (×16): 10 mg
  Filled 2024-08-14 (×15): qty 2

## 2024-08-14 MED ORDER — ACETAMINOPHEN 160 MG/5ML PO SOLN
500.0000 mg | Freq: Four times a day (QID) | ORAL | Status: DC | PRN
  Administered 2024-08-14 – 2024-08-15 (×2): 500 mg
  Filled 2024-08-14 (×2): qty 20.3

## 2024-08-14 MED ADMIN — Carbidopa & Levodopa Tab 25-100 MG: 1 | NDC 60687066111

## 2024-08-14 MED ADMIN — Carbidopa & Levodopa Tab 25-100 MG: 1 | NDC 59651045701

## 2024-08-14 MED FILL — Carbidopa & Levodopa Tab 25-100 MG: 1.0000 | ORAL | Qty: 1 | Status: AC

## 2024-08-14 NOTE — Progress Notes (Signed)
 NEUROLOGY CONSULT FOLLOW UP NOTE   Date of service: August 14, 2024 Patient Name: Brenda [name] MRN:  [id number] DOB:  September 03, 1961  Interval Hx/subjective  Patient seen and examined. Daughter at bedside Remains drowsy. Prominent resting tremor in the face  Vitals   Vitals:   08/13/24 2320 08/14/24 0412 08/14/24 0723 08/14/24 1127  BP: 116/61 (!) 172/64 (!) 187/72 (!) 181/88  Pulse: (!) 56 89 (!) 104 (!) 108  Resp: 17 16 19 17   Temp: 97.8 F (36.6 C) 98.2 F (36.8 C) 99.2 F (37.3 C) 99 F (37.2 C)  TempSrc: Oral Axillary Oral Oral  SpO2: 100% 99% 100% 95%  Weight:      Height:         Body mass index is 34.02 kg/m.  Physical Exam   General: Awake, alert and oriented to self. HEENT: Alopecia and facial hirsutism noted CVS: Regular rhythm Abdomen nondistended nontender Neurological exam More drowsy today Follow simple commands but not as swiftly as she did yesterday No gross cranial nerve deficits On motor examination, she does have increased tone-today there was more cogwheeling noted in the upper extremities as well but the increased tone is actually better than before. Sensation appears intact to light touch Pretty much unchanged from yesterday    Medications  Current Facility-Administered Medications:    acetaminophen  (TYLENOL ) 160 MG/5ML solution 500 mg, 500 mg, Per Tube, Q6H PRN, Drusilla, Sabas RAMAN, MD, 500 mg at 08/14/24 1129   artificial tears (LACRILUBE) ophthalmic ointment, , Both Eyes, Q4H PRN, Tariq, Hassan, MD, Given at 08/11/24 2102   atorvastatin  (LIPITOR ) tablet 80 mg, 80 mg, Per Tube, QHS, Sigdel, Santosh, MD, 80 mg at 08/13/24 2024   baclofen  (LIORESAL ) tablet 5 mg, 5 mg, Per Tube, BID, Chen, Lydia D, RPH, 5 mg at 08/14/24 1131   carbidopa-levodopa (SINEMET IR) 25-100 MG per tablet immediate release 1 tablet, 1 tablet, Per Tube, TID, Cinque Begley, MD, 1 tablet at 08/14/24 1131   Chlorhexidine  Gluconate Cloth 2 % PADS 6 each, 6 each, Topical,  Daily, Chavez, Abigail, NP, 6 each at 08/14/24 1000   enoxaparin  (LOVENOX ) injection 80 mg, 1 mg/kg, Subcutaneous, Q12H, Tariq, Hassan, MD, 80 mg at 08/14/24 0809   feeding supplement (ENSURE PLUS HIGH PROTEIN) liquid 237 mL, 237 mL, Oral, Q24H, Drusilla, Gagan S, MD, 237 mL at 08/12/24 1435   feeding supplement (JEVITY 1.5 CAL/FIBER) liquid 1,000 mL, 1,000 mL, Per Tube, Continuous, Cosette Blackwater, MD, Last Rate: 45 mL/hr at 08/14/24 0626, Infusion Verify at 08/14/24 0626   feeding supplement (PROSource TF20) liquid 60 mL, 60 mL, Per Tube, Daily, Sigdel, Santosh, MD, 60 mL at 08/14/24 1130   free water  75 mL, 75 mL, Per Tube, Q4H, Cosette Blackwater, MD, 75 mL at 08/14/24 1132   insulin  aspart (novoLOG ) injection 0-20 Units, 0-20 Units, Subcutaneous, Q4H, Perri DELENA Meliton Mickey., MD, 4 Units at 08/14/24 1146   insulin  glargine (LANTUS ) injection 25 Units, 25 Units, Subcutaneous, BID, Perri DELENA Meliton Mickey., MD, 25 Units at 08/14/24 1146   levETIRAcetam  (KEPPRA ) tablet 500 mg, 500 mg, Per Tube, BID, Chen, Lydia D, RPH, 500 mg at 08/14/24 1131   lidocaine  (LIDODERM ) 5 % 1 patch, 1 patch, Transdermal, Daily, Alekh, Kshitiz, MD, 1 patch at 08/14/24 1131   loperamide  HCl (IMODIUM ) 1 MG/7.5ML suspension 2 mg, 2 mg, Per Tube, PRN, Merilee Linsey I, RPH, 2 mg at 08/05/24 9178   morphine  (PF) 2 MG/ML injection 2 mg, 2 mg, Intravenous, Q2H PRN, 2 mg at  08/12/24 0318 **OR** morphine  (PF) 2 MG/ML injection 4 mg, 4 mg, Intravenous, Q2H PRN, Perri DELENA Meliton Mickey., MD, 4 mg at 08/12/24 2354   naloxone  (NARCAN ) injection 0.4 mg, 0.4 mg, Intravenous, PRN, Alfornia Madison, MD   ondansetron  (ZOFRAN ) tablet 4 mg, 4 mg, Per Tube, Q6H PRN **OR** ondansetron  (ZOFRAN ) injection 4 mg, 4 mg, Intravenous, Q6H PRN, Cosette Blackwater, MD   oxyCODONE  (Oxy IR/ROXICODONE ) immediate release tablet 10 mg, 10 mg, Oral, QHS, Cooper, Josseline P, PA-C, 10 mg at 08/13/24 2024   oxyCODONE  (Oxy IR/ROXICODONE ) immediate release tablet 2.5 mg, 2.5  mg, Per Tube, Q3H PRN, Cooper, Josseline P, PA-C, 2.5 mg at 08/11/24 1816   prochlorperazine  (COMPAZINE ) tablet 10 mg, 10 mg, Oral, Q6H PRN, Foust, Katy L, NP   propranolol  (INDERAL ) tablet 20 mg, 20 mg, Per Tube, TID, Sigdel, Santosh, MD, 20 mg at 08/14/24 1131   senna-docusate (Senokot-S) tablet 1 tablet, 1 tablet, Oral, QHS PRN, Foust, Katy L, NP   sodium chloride  flush (NS) 0.9 % injection 10-40 mL, 10-40 mL, Intracatheter, Q12H, Sigdel, Santosh, MD, 10 mL at 08/13/24 2027   sodium chloride  flush (NS) 0.9 % injection 10-40 mL, 10-40 mL, Intracatheter, PRN, Mcarthur Pick, MD  Labs and Diagnostic Imaging   CBC:  Recent Labs  Lab 08/12/24 0300 08/13/24 0525  WBC 7.9 7.5  NEUTROABS 7.6 6.6  HGB 10.6* 10.0*  HCT 33.2* 31.2*  MCV 89.7 91.0  PLT 290 321    Basic Metabolic Panel:  Lab Results  Component Value Date   NA 135 08/13/2024   K 4.1 08/13/2024   CO2 29 08/13/2024   GLUCOSE 227 (H) 08/13/2024   BUN 31 (H) 08/13/2024   CREATININE 0.46 08/13/2024   CALCIUM  8.7 (L) 08/13/2024   GFRNONAA >60 08/13/2024   GFRAA 94 12/16/2019   Lipid Panel:  Lab Results  Component Value Date   LDLCALC 28 05/30/2024   HgbA1c:  Lab Results  Component Value Date   HGBA1C 7.2 (H) 06/01/2024   Urine Drug Screen:     Component Value Date/Time   LABOPIA POSITIVE (A) 04/20/2024 1817   COCAINSCRNUR NONE DETECTED 04/20/2024 1817   LABBENZ NONE DETECTED 04/20/2024 1817   AMPHETMU NONE DETECTED 04/20/2024 1817   THCU NONE DETECTED 04/20/2024 1817   LABBARB NONE DETECTED 04/20/2024 1817    Alcohol Level     Component Value Date/Time   ETH <15 04/20/2024 1344   INR  Lab Results  Component Value Date   INR 1.0 07/16/2024   APTT  Lab Results  Component Value Date   APTT 67 (H) 06/21/2024   Imaging personally reviewed CT Head without contrast(Personally reviewed): 1. Chronic encephalomalacia changes in the right medial temporal and occipital lobes developed in the interim. 2.  Unchanged small calcified meningioma along left frontal convexity.   MRI Brain(Personally reviewed): 1. Similar evolving infarcts in the anterior and posterior circulation. 2. No new infarcts, progressive mass effect or acute hemorrhage.   Repeat MRI Brain 12/7 1. Evolving infarcts without new/interval acute abnormality. 2. No evidence of metastatic disease.   MRI C spine 12/7 1. No evidence of metastatic disease. 2. Mild foraminal stenosis on the left at C3-C4 and right at C5-C6 and C6-C7.  Assessment   Aaryn Parrilla is a 63 y.o. female past history of CAD, DM, obesity, OSA, metastatic endometrial cancer, prior stroke, DVT, PE on anticoagulation, recurrent UTIs with concern for cefepime  toxicity causing seizures for which she which she was started on Keppra  and cefepime   was discontinued, continues to have neurological worsening despite cessation of seizures and no new strokes on imaging.  Cervical spine imaging also unremarkable for acute process. At this time, stiffness on her exam and altered mental status has an unclear etiology but differentials include serotonin syndrome due to being on Prozac  versus side effect/neurotoxicity from the PD-1 inhibitor pembrolizumab , which is being used to treat her cancer.  MRI is negative for acute strokes-she has had old strokes that are now resolving. Her CSF results do not favor acute inflammatory demyelinating  polyneuropathy/Guillain-Barr type of pathology.   PD-1 inhibitor induced myasthenia and myositis remain in the differentials but with a nearly normal CK, myositis is less likely.  PD-1 inhibitor induced myasthenia still remains in the differential along with serotonin syndrome. She was only on a very low-dose of Prozac  which makes me think that serotonin syndrome may not be the true culprit here. Treatment of the PD-1 antibody induced side effects-steroids are first-line followed by IVIG and plasma exchange. Resting tremor been ongoing now  for over a day, now also involving the facial muscles.  Unclear whether this is more parkinsonian or seizure.  Will get LTM EEG.  Impression: Altered mental status and tremors, evaluate for chemotherapy related neurotoxicity Tremors  Recommendations  May need to start consideration for plasma exchange once completed 3 days of methylprednisolone . Trial Sinemet because of parkinsonian features noticed by me today and also similar things mentioned by my colleague in the past Given the new tremors, LTM EEG Supportive care per primary team as you are Will continue to follow  Plan discussed with Dr. Drusilla and Dr. Cloretta ______________________________________________________________________   Signed, Eligio Lav, MD [facility]

## 2024-08-14 NOTE — Inpatient Diabetes Management (Signed)
 Inpatient Diabetes Program Recommendations  AACE/ADA: New Consensus Statement on Inpatient Glycemic Control (2015)  Target Ranges:  Prepandial:   less than 140 mg/dL      Peak postprandial:   less than 180 mg/dL (1-2 hours)      Critically ill patients:  140 - 180 mg/dL   Lab Results  Component Value Date   GLUCAP 188 (H) 08/14/2024   HGBA1C 7.2 (H) 06/01/2024    Review of Glycemic Control  Latest Reference Range & Units 08/13/24 07:33 08/13/24 12:13 08/13/24 15:57 08/13/24 20:07 08/13/24 23:21 08/14/24 04:15 08/14/24 07:24  Glucose-Capillary 70 - 99 mg/dL 800 (H) 730 (H) 751 (H) 230 (H) 209 (H) 274 (H) 235 (H)  (H): Data is abnormally high  Diabetes history: DM2 Outpatient Diabetes medications: Tresiba  18 daily, Fiasp  1-3 units TID, metformin  1000 mg BID Current orders for Inpatient glycemic control: Lantus  25 units BID, Novolog  0-20 units Q4H, Jevity @ 45 ml/hr   Inpatient Diabetes Program Recommendations:     Please consider:   Novolog  3 units Q4H tube feed coverage.  Hold if feeds are held or discontinued.    Thank you, Wyvonna Pinal, MSN, CDCES Diabetes Coordinator Inpatient Diabetes Program [phone] (team pager from 8a-5p)

## 2024-08-14 NOTE — TOC Progression Note (Signed)
 Transition of Care [facility]) - Progression Note    Patient Details  Name: Brenda [name] MRN: [id number] Date of Birth: 07/02/1961  Transition of [facility]) CM/SW Contact  [name], [place] Phone Number: 08/14/2024, 12:05 PM  Clinical Narrative:   CSW following for disposition. Medical workup ongoing at this time. CSW to follow.    Expected Discharge Plan:  (TBD) Barriers to Discharge: Continued Medical Work up               Expected Discharge Plan and Services                                               Social Drivers of Health (SDOH) Interventions SDOH Screenings   Food Insecurity: No Food Insecurity (07/16/2024)  Recent Concern: Food Insecurity - Food Insecurity Present (04/20/2024)  Housing: Low Risk (07/16/2024)  Transportation Needs: No Transportation Needs (07/16/2024)  Utilities: Not At Risk (07/16/2024)  Depression (PHQ2-9): Low Risk (06/30/2024)  Recent Concern: Depression (PHQ2-9) - Medium Risk (05/07/2024)  Social Connections: Moderately Integrated (07/13/2024)  Tobacco Use: Medium Risk (07/16/2024)    Readmission Risk Interventions    07/14/2024    2:34 PM 05/04/2024   10:00 AM  Readmission Risk Prevention Plan  Transportation Screening Complete Complete  PCP or Specialist Appt within 3-5 Days  Complete  HRI or Home Care Consult  Complete  Social Work Consult for Recovery Care Planning/Counseling  Complete  Palliative Care Screening  Not Applicable  Medication Review Oceanographer) Complete Complete  PCP or Specialist appointment within 3-5 days of discharge Complete   HRI or Home Care Consult Complete   SW Recovery Care/Counseling Consult Complete   Palliative Care Screening Not Applicable   Skilled Nursing Facility Not Applicable

## 2024-08-14 NOTE — Progress Notes (Addendum)
 IP PROGRESS NOTE  Subjective:   Brenda [name] is less alert this morning.  Her daughter was at the bedside when I saw her at approximately 7:30 AM.    Objective: Vital signs in last 24 hours: Blood pressure (!) 187/72, pulse (!) 104, temperature 99.2 F (37.3 C), temperature source Oral, resp. rate 19, height 5' 2 (1.575 m), weight 186 lb (84.4 kg), last menstrual period 10/30/2014, SpO2 100%.  Intake/Output from previous day: 12/11 0701 - 12/12 0700 In: 5172 [NG/GT:5172] Out: 1050 [Urine:1050]  Physical Exam:  Neurologic: Nonverbal, not following commands, mild tremor of the arms, moves toes with noxious stimuli  Abdomen: Soft and nontender     Portacath/PICC-without erythema  Lab Results: Recent Labs    08/12/24 0300 08/13/24 0525  WBC 7.9 7.5  HGB 10.6* 10.0*  HCT 33.2* 31.2*  PLT 290 321    BMET Recent Labs    08/12/24 0300 08/13/24 0525  NA 134* 135  K 4.2 4.1  CL 100 100  CO2 28 29  GLUCOSE 278* 227*  BUN 17 31*  CREATININE 0.58 0.46  CALCIUM  8.5* 8.7*     Medications: I have reviewed the patient's current medications.  Assessment/Plan:  Endometrial cancer Diagnosed in 2023, status post surgery and radiation Recurrent disease July 2025 with a left retroperitoneal mass, biopsy confirmed adenocarcinoma, ER positive, MSI-high, tumor mutation burden 24, PD-L1 75%, PIK 3CA mutated, PTEN mutated, HER2 1+, loss of MLH1 expression 04/03/2024 CT chest: Spiculated 9 x 12 mm right upper lobe and part solid 4 x 6 mm right upper lobe nodules Cycle 1 paclitaxel /carboplatin /pembrolizumab  05/01/2024 Cycle 2 paclitaxel /carboplatin /pembrolizumab  06/30/2024 06/19/2024 CT abdomen/pelvis: Right lower lobe pulmonary embolism, splenic infarct, stable size of the left psoas mass with evidence of erosive change at the anterior aspect of L2, decreased size of left external iliac node 08/09/2024 CTs: New psoas muscle mass (tumor versus hematoma), left psoas/paraspinal mass has  decreased in size, by my review the previously noted lung nodules have resolved  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  DVT 06/02/2024 4.  Pulmonary embolism noted on CT chest 06/19/2024 5.  Diabetes 6.  CHF/CAD 7.  Seizures, cefepime  toxicity?  November 2025 8.  Progressive generalized loss of motor function-related to CVAs?,  Paraneoplastic? 08/11/2024-Solu-Medrol  daily x 3 9.  Respiratory failure 10.  Pain secondary to #1 11.  Anemia 12.  Elevated liver enzymes-improved  Brenda [name] has a history of endometrial cancer initially diagnosed in 2020.  She was diagnosed with recurrent disease involving a left retroperitoneal mass in July 2025.  She completed 2 treatments with paclitaxel /carboplatin /pembrolizumab .  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  She completed 2 cycles of systemic therapy for treatment of the uterine cancer.  I reviewed the restaging CT findings with Brenda [name] and her daughter.  The known malignancy at the left retroperitoneum has improved significantly.  The significance of the right psoas mass is unclear.  I suspect this is a benign finding.      The etiology of the diffuse generalized weakness is unclear.  There is likely a component related to the embolic CVAs and deconditioning, but the evolution of the loss of motor function over the past several weeks appears to be more severe than  expected.  She has undergone an extensive neurologic evaluation over the past month.  Neurology is following her in the hospital..  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  She started high-dose Solu-Medrol  on 08/11/2024, given daily  for 3 days.  Her neurologic status does not appear improved.  She is less alert today.  I discussed the case with Dr. Lonn yesterday.  She reports the patient had a poor performance status prior to this hospital admission.  She did does not feel Brenda [name] is a candidate for further systemic therapy.   The etiology of her pain is unclear based on the restaging CT.  The retroperitoneal mass is much smaller.  She could be having pain from the right psoas lesion or another etiology.  Her pain appears improved over the past few days.  She has taken morphine  infrequently.  The hemoglobin is stable.  I reviewed the case by secure chat with the neurology service.  Recommendations: Continue narcotic analgesics as needed for pain Continue evaluation of her mental status, seizures, and motor weakness per neurology Anticoagulation for the DVT/PE and CVAs, will need to consider holding anticoagulation if the hemoglobin drops significantly  tube feedings, follow-up with speech pathology Elevated liver enzymes-evaluate per the medical service, Lipitor ? Systemic treatment for the uterine cancer will remain on hold Please call oncology as needed, I will check on her 08/17/2024            LOS: 28 days   Arley Hof, MD   08/14/2024, 8:32 AM

## 2024-08-14 NOTE — Progress Notes (Signed)
 Speech Language Pathology Treatment: Dysphagia  Patient Details Name: Brenda [name] MRN: [id number] DOB: December 08, 1960 Today's Date: 08/14/2024 Time: 8965-8952 SLP Time Calculation (min) (ACUTE ONLY): 13 min  Assessment / Plan / Recommendation Clinical Impression  Pt seen with oldest daughter at bedside. Pt has been sleepier past few days and daughter states sometimes steroids make her sleepy but not sure if that is the cause. Sleeping on arrival and able to respond with unintelligible sound x 1. Daughter performed oral care this am. Attempted spoon sips diet Sprite with minimal attempts to close mouth around spoon with liquid spilling anterioraly. Did not attempt further trials given lethargy. Pt had congested cough prior to po's. Daughter very attentive, ordering lunch for pt and only attempts to feed when adequately alert. Discussed that po amount has been minimal which is expected and she may have instances of decreased airway protection given medical status with voiced understanding. ST to follow for continued education of strategies.    HPI HPI: Brenda [name] is a 63 y.o. year old presented to [facility] with chills and weakness, work up for UTI, finding of PE, transferred to [facility] BSE 11/14 recommended Dys 3/thin. Over weekend pt had significant decline in status, alertness. MRI 11/15 revealed no acute infarcts, interval evolution of previously identified embolic infarcts. Pt lethargic with no improvements over 3 sessions and discharged requesting re-eval if  improvement. MD/family requested re eval of swallow as some sedation meds have been d/c'd and showing fluctuating periods of alertness. PMH:  metastatic endometrial cancer, and chronic cancer-related pain, CVA, hypertension, hyperlipidemia, diabetes mellitus, depression, anxiety, DVT and PE , OSA, chronic hypoxic respiratory failure.      SLP Plan  Continue with current plan of care        Swallow Evaluation Recommendations    Recommendations: PO diet PO Diet Recommendation: Regular;Thin liquids (Level 0) (family ordering po pt can manage) Liquid Administration via: Straw;Spoon;Cup Medication Administration: Via alternative means Supervision: Full assist for feeding;Full supervision/cueing for swallowing strategies Postural changes: Position pt fully upright for meals Oral care recommendations: Oral care BID (2x/day)     Recommendations                     Oral care BID   Frequent or constant Supervision/Assistance Dysphagia, oropharyngeal phase (R13.12)     Continue with current plan of care     Dustin Olam Bull  08/14/2024, 10:59 AM

## 2024-08-14 NOTE — Progress Notes (Signed)
 [facility]  PROGRESS NOTE  Brenda [name] FMW:[id number] DOB: 10/09/1960 DOA: 07/15/2024 PCP: [facility]   Brief HPI:   63 year old female with history of metastatic endometrial cancer, chronic cancer-related pain, hypertension, hyperlipidemia, diabetes mellitus, depression, anxiety, CVA, history of DVT and PE anticoagulated on Lovenox , OSA, chronic hypoxic respiratory failure on 2L via nasal cannula and recent hospitalization and discharged on 07/14/2024 for UTI treated with IV antibiotics followed by oral antibiotics on discharge presented with chills and weakness.  She was found to have UTI and was started on IV cefepime .  Unfortunately mental status worsened and she was transferred to [facility] for continuous EEG monitoring with 4 seizures noted on video EEG. Started on Keppra  11/16. Placed on empiric broad-spectrum antibiotics for possible meningitis/encephalitis which was ruled out based on CSF results. As per husband, mental status worsened since antibiotics stopped. Not able to participate with any meaningful history and following only few commands.    No improvement in mentation, developed some loose stool that was attributed to tube feed formula with no concern for infection. Nothing much to offer from oncology and they advised palliative care. Family meeting with palliative and they want her to be transferred to a tertiary care center.  Discussed with her oncologist as we do not have any diagnosis which cannot be treated at current facility, transfer will not happen for a second opinion.  Per oncology, family need to arrange themself to take her to any tertiary care center of their choice , apparently has an outpatient appointment with [facility] oncology in December. Husband still hopeful the patient will improve in terms of mental status.       Assessment/Plan:   Acute metabolic encephalopathy Subclinical seizures -Mental status is worse today -Likely in  setting of cefepime  causing worsening mental status; cefepime  discontinued - On 11/15, stiffening of upper extremities, patient transferred to [facility] for continuous video EEG. MRI brain on 11/15 is negative for acute findings.  Shows evolution of ischemic infarcts similar to before. Video EEG: 4 seizures noted on 11/16. Status post LP with acute meningitis/encephalitis panel negative and transient empiric antibiotics   - concern of some type of paraneoplastic with her history of advanced malignancy. 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, started on high-dose Solu-Medrol  on 08/11/2024; last dose today Neurology considering plasma exchange in am, also started on Sinemet trial    Dysphagia -Swallow evaluation obtained, underwent MBS -Started on regular diet   Depression/anxiety - Prozac  has been discontinued due to rigidity; concern for serotonin syndrome   Acute on chronic anemia Iron  deficiency - patient has history of anemia of chronic disease - Hgb 8 < 6.8, baseline 7-10 prior to this prolonged hospitalization - No signs/symptoms of acute bleeding - continue to hold therapeutic lovenox  - given 1 unit pRBC on 12/1; Hgb goal > 7 - iron  studies concerning for iron  deficiency - IV iron  for 5 day ; ended 12/5    Endometrial cancer with metastases Cancer related pain -Appreciate Dr. Andriette input CT chest/abdomen/pelvis for persistent (worsening?) back pain (note centrally necrotic infiltrative mass in L psoas in 06/2024) CT 12/7 with R psoas muscle mass like enlargement measuring 5.0 x 4.6 cm indeterminate for tumor vs hematoma (recommended correlation with clinical status and possible contrast enhanced mri or short interval follow imaging  - Hb is stable, consider repeat imaging in a week or so) - L psoas/paraspinal mass  decreased in size.   Appreciate oncology input Will continue to adjust pain  meds    Acute RLL segmental PE Splenic infarct - acute PE diagnosed in 06/19/2024 - splenic infarct diagnosed in 05/2024 - resume Lovenox  given Hgb stable and no signs/symptoms for acute bleed - monitor CBC    History of CAD History of stroke Hyperlipidemia -Continue statin.  Not on aspirin  since she is to be on treatment dose anticoagulation - resumedLovenox given Hgb stable and no signs/symptoms for acute bleed   Hypernatremia   Resolved with IV/PO hydration.    UTI:  Present on admission, failed p.o. antibiotics - Blood and urine cultures negative so far.  - Antibiotics were discontinued.   Hypophosphatemia Hypokalemia  -replete   Hyperthyroidism TSH is 0.23,  free T4 level elevated 1.4.  Mild suppression of TSH.  Likely from ongoing illness.   1.  Left: Patent.  Antithyroid medications are not indicated at this time.   - cont propranolol  to help with tremors.    Chronic respiratory failure with hypoxia OSA - Respiratory status stable.  - Continue supplemental oxygen .  - Continue CPAP at night   Hypoglycemia Diabetes mellitus type 2  - resolved with D50 - cont semglee  25mg  BID  - Continue with Lantus  and SSI -CBG has been elevated due to Solu-Medrol ; Solu-Medrol  discontinued today -Hopefully CBG will improve, we will continue to monitor   Obesity class I - Outpatient follow-up   Pressure injury to sacrum stage 2, not present on admission  - cont local wound care as per wound care team    Physical deconditioning - Likely will need SNF pending her clinical course.    Goals of care - Palliative care following - Appreciate Dr. Kathaleen input, she is not a candidate for further treatment for uterine cancer until her performance status improves    DVT prophylaxis: Lovenox   Medications     amLODipine  10 mg Per Tube Daily   atorvastatin   80 mg Per Tube QHS   baclofen   5 mg Per Tube BID   carbidopa-levodopa  1 tablet Per Tube TID   Chlorhexidine   Gluconate Cloth  6 each Topical Daily   enoxaparin  (LOVENOX ) injection  1 mg/kg Subcutaneous Q12H   feeding supplement  237 mL Oral Q24H   feeding supplement (PROSource TF20)  60 mL Per Tube Daily   free water   75 mL Per Tube Q4H   insulin  aspart  0-20 Units Subcutaneous Q4H   insulin  glargine  25 Units Subcutaneous BID   levETIRAcetam   500 mg Per Tube BID   lidocaine   1 patch Transdermal Daily   oxyCODONE   10 mg Oral QHS   propranolol   20 mg Per Tube TID   sodium chloride  flush  10-40 mL Intracatheter Q12H     Data Reviewed:   CBG:  Recent Labs  Lab 08/13/24 2321 08/14/24 0415 08/14/24 0724 08/14/24 1129 08/14/24 1531  GLUCAP 209* 274* 235* 188* 222*    SpO2: 99 % O2 Flow Rate (L/min): 2 L/min FiO2 (%): 21 %    Vitals:   08/14/24 0412 08/14/24 0723 08/14/24 1127 08/14/24 1522  BP: (!) 172/64 (!) 187/72 (!) 181/88 (!) 187/70  Pulse: 89 (!) 104 (!) 108 96  Resp: 16 19 17 17   Temp: 98.2 F (36.8 C) 99.2 F (37.3 C) 99 F (37.2 C) 99.4 F (37.4 C)  TempSrc: Axillary Oral Oral Oral  SpO2: 99% 100% 95% 99%  Weight:      Height:  Data Reviewed:  Basic Metabolic Panel: Recent Labs  Lab 08/09/24 1221 08/10/24 0405 08/11/24 0540 08/12/24 0300 08/13/24 0525  NA 136 136 137 134* 135  K 4.0 4.3 4.3 4.2 4.1  CL 101 103 101 100 100  CO2 29 28 25 28 29   GLUCOSE 86 135* 103* 278* 227*  BUN 15 15 16 17  31*  CREATININE 0.49 0.37* 0.66 0.58 0.46  CALCIUM  8.5* 8.4* 8.5* 8.5* 8.7*  MG  --  1.8 1.9 1.9 2.1  PHOS  --  3.8 3.8 3.3 3.0    CBC: Recent Labs  Lab 08/09/24 1221 08/10/24 0405 08/11/24 0540 08/12/24 0300 08/13/24 0525  WBC 11.4* 8.6 8.2 7.9 7.5  NEUTROABS  --  6.2 6.0 7.6 6.6  HGB 10.1* 8.6* 9.6* 10.6* 10.0*  HCT 30.8* 27.1* 30.0* 33.2* 31.2*  MCV 87.3 89.7 89.8 89.7 91.0  PLT 324 266 277 290 321    LFT Recent Labs  Lab 08/09/24 1221 08/10/24 0405 08/11/24 0540 08/12/24 0300 08/13/24 0525  AST 150* 103* 124* 77* 33  ALT 168*  176* 210* 186* 123*  ALKPHOS 140* 193* 258* 255* 213*  BILITOT 0.7 0.6 0.6 0.7 0.6  PROT 6.2* 5.4* 6.2* 6.9 6.3*  ALBUMIN 2.4* 2.1* 2.4* 2.5* 2.4*     Antibiotics: Anti-infectives (From admission, onward)    Start     Dose/Rate Route Frequency Ordered Stop   07/23/24 0600  vancomycin  (VANCOCIN ) IVPB 1000 mg/200 mL premix  Status:  Discontinued        1,000 mg 200 mL/hr over 60 Minutes Intravenous Every 12 hours 07/22/24 1834 07/24/24 1808   07/22/24 2200  meropenem  (MERREM ) 2 g in sodium chloride  0.9 % 100 mL IVPB  Status:  Discontinued        2 g 280 mL/hr over 30 Minutes Intravenous Every 8 hours 07/22/24 1601 07/24/24 1808   07/22/24 1700  vancomycin  (VANCOREADY) IVPB 1500 mg/300 mL        1,500 mg 150 mL/hr over 120 Minutes Intravenous  Once 07/22/24 1601 07/22/24 2031   07/22/24 1645  ampicillin  (OMNIPEN) 2 g in sodium chloride  0.9 % 100 mL IVPB  Status:  Discontinued        2 g 300 mL/hr over 20 Minutes Intravenous Every 6 hours 07/22/24 1557 07/24/24 1800   07/19/24 0530  meropenem  (MERREM ) 1 g in sodium chloride  0.9 % 100 mL IVPB  Status:  Discontinued        1 g 200 mL/hr over 30 Minutes Intravenous Every 8 hours 07/19/24 0438 07/22/24 1601   07/16/24 0900  ceFEPIme  (MAXIPIME ) 2 g in sodium chloride  0.9 % 100 mL IVPB  Status:  Discontinued        2 g 200 mL/hr over 30 Minutes Intravenous Every 8 hours 07/16/24 0811 07/19/24 0409   07/16/24 0815  ceFEPIme  (MAXIPIME ) 2 g in sodium chloride  0.9 % 100 mL IVPB  Status:  Discontinued        2 g 200 mL/hr over 30 Minutes Intravenous  Once 07/16/24 0803 07/16/24 0811   07/16/24 0330  cefTRIAXone  (ROCEPHIN ) 2 g in sodium chloride  0.9 % 100 mL IVPB        2 g 200 mL/hr over 30 Minutes Intravenous  Once 07/16/24 0315 07/16/24 0412        CONSULTS-palliative care, neurology  Code Status: DNR  Family Communication: Discussed with patient's husband at bedside     Subjective   Patient is somnolent today.  Mildly opens  eyes  to sternal rub  Objective    Physical Examination:  Drowsy Barely opens eyes to sternal rub S1-S2, regular Abdomen is soft, nontender, no organomegaly Extremities no edema    Wound 07/25/24 1100 Pressure Injury Sacrum Medial Deep Tissue Pressure Injury - Purple or maroon localized area of discolored intact skin or blood-filled blister due to damage of underlying soft tissue from pressure and/or shear. (Active)     Wound 08/11/24 0415 Pressure Injury Vertebral column Medial Stage 2 -  Partial thickness loss of dermis presenting as a shallow open injury with a red, pink wound bed without slough. (Active)        Sabas GORMAN Brod   [facility] If 7PM-7AM, please contact night-coverage at [email or web address], Office  [phone]   08/14/2024, 4:43 PM  LOS: 28 days

## 2024-08-14 NOTE — Plan of Care (Signed)
 Problem: Education: Goal: Ability to describe self-care measures that may prevent or decrease complications (Diabetes Survival Skills Education) will improve Outcome: Not Progressing Goal: Individualized Educational Video(s) Outcome: Not Progressing   Problem: Coping: Goal: Ability to adjust to condition or change in health will improve Outcome: Not Progressing   Problem: Fluid Volume: Goal: Ability to maintain a balanced intake and output will improve Outcome: Not Progressing   Problem: Health Behavior/Discharge Planning: Goal: Ability to identify and utilize available resources and services will improve Outcome: Not Progressing Goal: Ability to manage health-related needs will improve Outcome: Not Progressing   Problem: Metabolic: Goal: Ability to maintain appropriate glucose levels will improve Outcome: Not Progressing   Problem: Nutritional: Goal: Maintenance of adequate nutrition will improve Outcome: Not Progressing Goal: Progress toward achieving an optimal weight will improve Outcome: Not Progressing   Problem: Skin Integrity: Goal: Risk for impaired skin integrity will decrease Outcome: Not Progressing   Problem: Tissue Perfusion: Goal: Adequacy of tissue perfusion will improve Outcome: Not Progressing   Problem: Education: Goal: Knowledge of General Education information will improve Description: Including pain rating scale, medication(s)/side effects and non-pharmacologic comfort measures Outcome: Not Progressing   Problem: Health Behavior/Discharge Planning: Goal: Ability to manage health-related needs will improve Outcome: Not Progressing   Problem: Clinical Measurements: Goal: Ability to maintain clinical measurements within normal limits will improve Outcome: Not Progressing Goal: Will remain free from infection Outcome: Not Progressing Goal: Diagnostic test results will improve Outcome: Not Progressing Goal: Respiratory complications will  improve Outcome: Not Progressing Goal: Cardiovascular complication will be avoided Outcome: Not Progressing   Problem: Activity: Goal: Risk for activity intolerance will decrease Outcome: Not Progressing   Problem: Nutrition: Goal: Adequate nutrition will be maintained Outcome: Not Progressing   Problem: Coping: Goal: Level of anxiety will decrease Outcome: Not Progressing   Problem: Elimination: Goal: Will not experience complications related to bowel motility Outcome: Not Progressing Goal: Will not experience complications related to urinary retention Outcome: Not Progressing   Problem: Pain Managment: Goal: General experience of comfort will improve and/or be controlled Outcome: Not Progressing   Problem: Safety: Goal: Ability to remain free from injury will improve Outcome: Not Progressing   Problem: Skin Integrity: Goal: Risk for impaired skin integrity will decrease Outcome: Not Progressing   Problem: Education: Goal: Knowledge of General Education information will improve Description: Including pain rating scale, medication(s)/side effects and non-pharmacologic comfort measures Outcome: Not Progressing   Problem: Health Behavior/Discharge Planning: Goal: Ability to manage health-related needs will improve Outcome: Not Progressing   Problem: Clinical Measurements: Goal: Ability to maintain clinical measurements within normal limits will improve Outcome: Not Progressing Goal: Will remain free from infection Outcome: Not Progressing Goal: Diagnostic test results will improve Outcome: Not Progressing Goal: Respiratory complications will improve Outcome: Not Progressing Goal: Cardiovascular complication will be avoided Outcome: Not Progressing   Problem: Activity: Goal: Risk for activity intolerance will decrease Outcome: Not Progressing   Problem: Nutrition: Goal: Adequate nutrition will be maintained Outcome: Not Progressing   Problem:  Coping: Goal: Level of anxiety will decrease Outcome: Not Progressing   Problem: Elimination: Goal: Will not experience complications related to bowel motility Outcome: Not Progressing Goal: Will not experience complications related to urinary retention Outcome: Not Progressing   Problem: Pain Managment: Goal: General experience of comfort will improve and/or be controlled Outcome: Not Progressing   Problem: Safety: Goal: Ability to remain free from injury will improve Outcome: Not Progressing   Problem: Skin Integrity: Goal: Risk for impaired skin integrity  will decrease Outcome: Not Progressing

## 2024-08-14 NOTE — Progress Notes (Signed)
 LTM EEG hooked up and running - no initial skin breakdown - push button tested - [facility] monitoring.CT leads used

## 2024-08-14 NOTE — Treatment Plan (Signed)
 Occupational Therapy Treatment Patient Details Name: Brenda [name] MRN: [id number] DOB: 1961/06/11 Today's Date: 08/14/2024   History of present illness 63 yo female who returned to hospital on 07/15/2024 with ongoing signs and symptoms of UTI with generalized weakness. Pt with 4 seizures on EEG 11/16. Working diagnosis of cefepime  neurotoxicity. Prior admit 11/9 with urinary symptoms and AMS d/c home on OP ABX. PMH includes but is not limited to: HTN, HLD, DM, depression/anxiety, CVA (CIR recently), DVT/PE, OSA on CPAP, chronic respiratory failure on 2 L/min at baseline, and metastatic endometrial cancer with chronic cancer-related pain.   OT comments  Pt elevated for resting hand splints at this time. Pt with increased tone and wrist extension compared to prior sessions. Pt with active EEG running. OT to hold on splinting due to increased tone at this time. Goals update at this time. Next session consider possible placement of red foam in the lamb wool palm guards in the room.       If plan is discharge home, recommend the following:      Equipment Recommendations       Recommendations for Other Services      Precautions / Restrictions Precautions Precautions: Fall       Mobility Bed Mobility                    Transfers                         Balance                                           ADL either performed or assessed with clinical judgement   ADL                                              Extremity/Trunk Assessment Upper Extremity Assessment RUE Deficits / Details: Pt demonstrates wrist extension with mcp restricted to 90 degrees with digit flexion. pt with decreased digit extension. LUE Deficits / Details: pt making a fist like posture with wrist extension            Vision       Perception     Praxis     Communication Communication Communication: Impaired   Cognition Arousal:  Lethargic Behavior During Therapy: Flat affect Cognition: Cognition impaired             OT - Cognition Comments: pt stated yes and okay only during session. limited verbal response                 Following commands: Impaired        Cueing      Exercises Other Exercises Other Exercises: PROM provied to DIP PIP and within ROM for MPC with attempts at tendon gliding. stretch applied withing tolerance to the palm. pt does not appear to have the same range noted by the OTA at this time so will hold splints noting increased tone and active EEG running in room. pt with limited verbalizations    Shoulder Instructions       General Comments      Pertinent Vitals/ Pain       Pain Assessment Pain Assessment: Faces Faces Pain Scale: No hurt  Home Living                                          Prior Functioning/Environment              Frequency           Progress Toward Goals  OT Goals(current goals can now be found in the care plan section)  Progress towards OT goals: Not progressing toward goals - comment  Acute Rehab OT Goals Patient Stated Goal: okay OT Goal Formulation: With family Time For Goal Achievement: 08/28/24 Potential to Achieve Goals: Fair ADL Goals Pt Will Perform Grooming: with mod assist;bed level Pt Will Perform Lower Body Bathing: with contact guard assist;sit to/from stand Pt Will Perform Lower Body Dressing: with contact guard assist;sitting/lateral leans;with adaptive equipment Pt Will Transfer to Toilet: with supervision;bedside commode;ambulating Pt Will Perform Toileting - Clothing Manipulation and hygiene: with contact guard assist;sit to/from stand Pt/caregiver will Perform Home Exercise Program: Increased ROM;Both right and left upper extremity;With minimal assist Additional ADL Goal #1: Pt will maintain wakefulness for 50% of the time during OT sessions to improve overall engagement. Additional ADL Goal  #2: Pt/caregiver will  Plan      Co-evaluation                 AM-PAC OT 6 Clicks Daily Activity     Outcome Measure   Help from another person eating meals?: Total Help from another person taking care of personal grooming?: Total Help from another person toileting, which includes using toliet, bedpan, or urinal?: Total Help from another person bathing (including washing, rinsing, drying)?: Total Help from another person to put on and taking off regular upper body clothing?: Total Help from another person to put on and taking off regular lower body clothing?: Total 6 Click Score: 6    End of Session Equipment Utilized During Treatment: Gait belt;Rolling walker (2 wheels);Oxygen   OT Visit Diagnosis: Muscle weakness (generalized) (M62.81);Low vision, both eyes (H54.2);Feeding difficulties (R63.3);Cognitive communication deficit (R41.841) Symptoms and signs involving cognitive functions: Cerebral infarction   Activity Tolerance Patient limited by lethargy   Patient Left in bed;with call bell/phone within reach;with bed alarm set;with family/visitor present   Nurse Communication Other (comment)        Time: 8749-8695 OT Time Calculation (min): 14 min  Charges: OT General Charges $OT Visit: 1 Visit OT Treatments $Therapeutic Exercise: 8-22 mins   Brynn, OTR/L  Acute Rehabilitation Services Office: [phone] .   Ely Molt 08/14/2024, 2:38 PM

## 2024-08-15 ENCOUNTER — Inpatient Hospital Stay (HOSPITAL_COMMUNITY)

## 2024-08-15 DIAGNOSIS — N390 Urinary tract infection, site not specified: Secondary | ICD-10-CM | POA: Diagnosis not present

## 2024-08-15 DIAGNOSIS — G9341 Metabolic encephalopathy: Secondary | ICD-10-CM | POA: Diagnosis not present

## 2024-08-15 DIAGNOSIS — I2510 Atherosclerotic heart disease of native coronary artery without angina pectoris: Secondary | ICD-10-CM | POA: Diagnosis not present

## 2024-08-15 LAB — URINALYSIS, ROUTINE W REFLEX MICROSCOPIC
Bilirubin Urine: NEGATIVE
Glucose, UA: NEGATIVE mg/dL
Hgb urine dipstick: NEGATIVE
Ketones, ur: NEGATIVE mg/dL
Nitrite: NEGATIVE
Protein, ur: 30 mg/dL — AB
Specific Gravity, Urine: 1.025 (ref 1.005–1.030)
WBC, UA: >50 WBC/hpf (ref 0–5)
pH: 8 (ref 5.0–8.0)

## 2024-08-15 LAB — CBC
HCT: 33 % — ABNORMAL LOW (ref 36.0–46.0)
Hemoglobin: 10.8 g/dL — ABNORMAL LOW (ref 12.0–15.0)
MCH: 30 pg (ref 26.0–34.0)
MCHC: 32.7 g/dL (ref 30.0–36.0)
MCV: 91.7 fL (ref 80.0–100.0)
Platelets: 352 K/uL (ref 150–400)
RBC: 3.6 MIL/uL — ABNORMAL LOW (ref 3.87–5.11)
RDW: 19.9 % — ABNORMAL HIGH (ref 11.5–15.5)
WBC: 8.5 K/uL (ref 4.0–10.5)
nRBC: 0 % (ref 0.0–0.2)

## 2024-08-15 LAB — COMPREHENSIVE METABOLIC PANEL WITH GFR
ALT: 62 U/L — ABNORMAL HIGH (ref 0–44)
AST: 50 U/L — ABNORMAL HIGH (ref 15–41)
Albumin: 2.6 g/dL — ABNORMAL LOW (ref 3.5–5.0)
Alkaline Phosphatase: 167 U/L — ABNORMAL HIGH (ref 38–126)
Anion gap: 7 (ref 5–15)
BUN: 24 mg/dL — ABNORMAL HIGH (ref 8–23)
CO2: 28 mmol/L (ref 22–32)
Calcium: 8.7 mg/dL — ABNORMAL LOW (ref 8.9–10.3)
Chloride: 104 mmol/L (ref 98–111)
Creatinine, Ser: 0.49 mg/dL (ref 0.44–1.00)
GFR, Estimated: >60 mL/min (ref >60)
Glucose, Bld: 208 mg/dL — ABNORMAL HIGH (ref 70–99)
Potassium: 4.2 mmol/L (ref 3.5–5.1)
Sodium: 139 mmol/L (ref 135–145)
Total Bilirubin: 0.5 mg/dL (ref 0.0–1.2)
Total Protein: 6.1 g/dL — ABNORMAL LOW (ref 6.5–8.1)

## 2024-08-15 LAB — GLUCOSE, CAPILLARY
Glucose-Capillary: 152 mg/dL — ABNORMAL HIGH (ref 70–99)
Glucose-Capillary: 168 mg/dL — ABNORMAL HIGH (ref 70–99)
Glucose-Capillary: 183 mg/dL — ABNORMAL HIGH (ref 70–99)
Glucose-Capillary: 200 mg/dL — ABNORMAL HIGH (ref 70–99)
Glucose-Capillary: 289 mg/dL — ABNORMAL HIGH (ref 70–99)
Glucose-Capillary: 355 mg/dL — ABNORMAL HIGH (ref 70–99)

## 2024-08-15 LAB — BRAIN NATRIURETIC PEPTIDE: B Natriuretic Peptide: 151.9 pg/mL — ABNORMAL HIGH (ref 0.0–100.0)

## 2024-08-15 MED ORDER — GUAIFENESIN 100 MG/5ML PO LIQD
15.0000 mL | Freq: Four times a day (QID) | ORAL | Status: DC | PRN
  Administered 2024-08-15 – 2024-08-25 (×4): 15 mL
  Filled 2024-08-15 (×4): qty 15

## 2024-08-15 MED ADMIN — Carbidopa & Levodopa Tab 25-100 MG: 1 | NDC 60687066111

## 2024-08-15 NOTE — Plan of Care (Signed)
 No significant improvement in exam today. Sinemet  was started yesterday Discussed plasma exchange with the husband today and he said, he going to think about it, discussed with daughters and let us  know.  Detailed conversation with the husband at the patient's bedside, along with Dr. Drusilla being present.  Eligio Lav, MD Neurology

## 2024-08-15 NOTE — Progress Notes (Signed)
 Foley catheter replaced per protocol, patient tolerated well

## 2024-08-15 NOTE — Progress Notes (Signed)
 [facility]  PROGRESS NOTE  Brenda [name] FMW:[id number] DOB: 1961/01/17 DOA: 07/15/2024 PCP: [facility]   Brief HPI:   63 year old female with history of metastatic endometrial cancer, chronic cancer-related pain, hypertension, hyperlipidemia, diabetes mellitus, depression, anxiety, CVA, history of DVT and PE anticoagulated on Lovenox , OSA, chronic hypoxic respiratory failure on 2L via nasal cannula and recent hospitalization and discharged on 07/14/2024 for UTI treated with IV antibiotics followed by oral antibiotics on discharge presented with chills and weakness.  She was found to have UTI and was started on IV cefepime .  Unfortunately mental status worsened and she was transferred to [facility] for continuous EEG monitoring with 4 seizures noted on video EEG. Started on Keppra  11/16. Placed on empiric broad-spectrum antibiotics for possible meningitis/encephalitis which was ruled out based on CSF results. As per husband, mental status worsened since antibiotics stopped. Not able to participate with any meaningful history and following only few commands.    No improvement in mentation, developed some loose stool that was attributed to tube feed formula with no concern for infection. Nothing much to offer from oncology and they advised palliative care. Family meeting with palliative and they want her to be transferred to a tertiary care center.  Discussed with her oncologist as we do not have any diagnosis which cannot be treated at current facility, transfer will not happen for a second opinion.  Per oncology, family need to arrange themself to take her to any tertiary care center of their choice , apparently has an outpatient appointment with [facility] oncology in December. Husband still hopeful the patient will improve in terms of mental status.       Assessment/Plan:   Acute metabolic encephalopathy Subclinical seizures -Mental status is worse today -Likely in  setting of cefepime  causing worsening mental status; cefepime  discontinued - On 11/15, stiffening of upper extremities, patient transferred to [facility] for continuous video EEG. MRI brain on 11/15 is negative for acute findings.  Shows evolution of ischemic infarcts similar to before. Video EEG: 4 seizures noted on 11/16. Status post LP with acute meningitis/encephalitis panel negative and transient empiric antibiotics   - concern of some type of paraneoplastic with her history of advanced malignancy. 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  Family will discuss among themselves and let us  know about the decision  Low-grade fever - Chest x-ray obtained today did not show pneumonia - Has been weaned off oxygen  to room air - UA was abnormal however patient has had Foley catheter placed 1 month ago which has been replaced today - Blood culture and urine culture obtained - Follow culture results - Does not appear septic, will hold off antibiotics for now - Continue to follow closely   Dysphagia -Swallow evaluation obtained, underwent MBS -Started on regular diet   Depression/anxiety - Prozac  has been discontinued due to rigidity; concern for serotonin syndrome   Acute on chronic anemia Iron  deficiency - patient has history of anemia of chronic disease - Hgb 8 < 6.8, baseline 7-10 prior to this prolonged hospitalization - No signs/symptoms of acute bleeding - continue to hold therapeutic lovenox  - given 1 unit pRBC on 12/1; Hgb goal > 7 - iron  studies concerning for iron  deficiency - IV iron  for 5 day ; ended 12/5  Endometrial cancer with metastases Cancer related pain -Appreciate Dr. Andriette input CT chest/abdomen/pelvis for persistent (worsening?)  back pain (note centrally necrotic infiltrative mass in L psoas in 06/2024) CT 12/7 with R psoas muscle mass like enlargement measuring 5.0 x 4.6 cm indeterminate for tumor vs hematoma (recommended correlation with clinical status and possible contrast enhanced mri or short interval follow imaging  - Hb is stable, consider repeat imaging in a week or so) - L psoas/paraspinal mass decreased in size.   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  Not on aspirin  since she is to be on treatment dose anticoagulation - resumedLovenox given Hgb stable and no signs/symptoms for acute bleed   Hypernatremia   Resolved with IV/PO hydration.    UTI:  Present on admission, failed p.o. antibiotics - Blood and urine cultures negative so far.  - Antibiotics were discontinued. -Urine culture has been obtained due to low-grade fever   Hypophosphatemia Hypokalemia  -replete   Hyperthyroidism TSH is 0.23,  free T4 level elevated 1.4.  Mild suppression of TSH.  Likely from ongoing illness.   1.  Left: Patent.  Antithyroid medications are not indicated at this time.   - cont propranolol  to help with tremors.    Chronic respiratory failure with hypoxia OSA - Respiratory status stable.  - Continue supplemental oxygen .  - Continue CPAP at night    Diabetes mellitus type 2  - cont semglee  25mg  BID  - Continue with Lantus  and SSI -CBG has been elevated due to Solu-Medrol ; Solu-Medrol  discontinued on 08/14/2024 CBG has improved after stopping Solu-Medrol    Obesity class I - Outpatient  follow-up   Pressure injury to sacrum stage 2, not present on admission  - cont local wound care as per wound care team    Physical deconditioning - Likely will need SNF pending her clinical course.    Goals of care - Palliative care following - Appreciate Dr. Kathaleen input, she is not a candidate for further treatment for uterine cancer until her performance status improves    DVT prophylaxis: Lovenox   Medications     amLODipine   10 mg Per Tube Daily   atorvastatin   80 mg Per Tube QHS   baclofen   5 mg Per Tube BID   carbidopa -levodopa   1 tablet Per Tube TID   Chlorhexidine  Gluconate Cloth  6 each Topical Daily   enoxaparin  (LOVENOX ) injection  1 mg/kg Subcutaneous Q12H   feeding supplement  237 mL Oral Q24H   feeding supplement (PROSource TF20)  60 mL Per Tube Daily   free water   75 mL Per Tube Q4H   insulin  aspart  0-20 Units Subcutaneous Q4H   insulin  glargine  25 Units Subcutaneous BID   levETIRAcetam   500 mg Per Tube BID   lidocaine   1 patch Transdermal Daily   oxyCODONE   10 mg Oral QHS   propranolol   20 mg Per Tube TID   sodium chloride  flush  10-40 mL Intracatheter Q12H     Data Reviewed:   CBG:  Recent Labs  Lab 08/14/24 2350 08/15/24 0400 08/15/24 0738 08/15/24 1203 08/15/24  1534  GLUCAP 281* 200* 183* 152* 168*    SpO2: 98 % O2 Flow Rate (L/min): 2 L/min FiO2 (%): 21 %    Vitals:   08/15/24 0057 08/15/24 0736 08/15/24 1201 08/15/24 1533  BP: (!) 160/65 (!) 166/78 (!) 145/73 135/75  Pulse: 78 (!) 102 82 86  Resp: 16 20 16 18   Temp: 99.4 F (37.4 C) 99.3 F (37.4 C) 99.5 F (37.5 C) 97.8 F (36.6 C)  TempSrc: Axillary Oral Oral Oral  SpO2: 98% 100% 100% 98%  Weight:      Height:          Data Reviewed:  Basic Metabolic Panel: Recent Labs  Lab 08/10/24 0405 08/11/24 0540 08/12/24 0300 08/13/24 0525 08/15/24 0545  NA 136 137 134* 135 139  K 4.3 4.3 4.2 4.1 4.2  CL 103 101 100 100 104  CO2 28 25 28 29 28   GLUCOSE 135* 103*  278* 227* 208*  BUN 15 16 17  31* 24*  CREATININE 0.37* 0.66 0.58 0.46 0.49  CALCIUM  8.4* 8.5* 8.5* 8.7* 8.7*  MG 1.8 1.9 1.9 2.1  --   PHOS 3.8 3.8 3.3 3.0  --     CBC: Recent Labs  Lab 08/10/24 0405 08/11/24 0540 08/12/24 0300 08/13/24 0525 08/15/24 0545  WBC 8.6 8.2 7.9 7.5 8.5  NEUTROABS 6.2 6.0 7.6 6.6  --   HGB 8.6* 9.6* 10.6* 10.0* 10.8*  HCT 27.1* 30.0* 33.2* 31.2* 33.0*  MCV 89.7 89.8 89.7 91.0 91.7  PLT 266 277 290 321 352    LFT Recent Labs  Lab 08/10/24 0405 08/11/24 0540 08/12/24 0300 08/13/24 0525 08/15/24 0545  AST 103* 124* 77* 33 50*  ALT 176* 210* 186* 123* 62*  ALKPHOS 193* 258* 255* 213* 167*  BILITOT 0.6 0.6 0.7 0.6 0.5  PROT 5.4* 6.2* 6.9 6.3* 6.1*  ALBUMIN 2.1* 2.4* 2.5* 2.4* 2.6*     Antibiotics: Anti-infectives (From admission, onward)    Start     Dose/Rate Route Frequency Ordered Stop   07/23/24 0600  vancomycin  (VANCOCIN ) IVPB 1000 mg/200 mL premix  Status:  Discontinued        1,000 mg 200 mL/hr over 60 Minutes Intravenous Every 12 hours 07/22/24 1834 07/24/24 1808   07/22/24 2200  meropenem  (MERREM ) 2 g in sodium chloride  0.9 % 100 mL IVPB  Status:  Discontinued        2 g 280 mL/hr over 30 Minutes Intravenous Every 8 hours 07/22/24 1601 07/24/24 1808   07/22/24 1700  vancomycin  (VANCOREADY) IVPB 1500 mg/300 mL        1,500 mg 150 mL/hr over 120 Minutes Intravenous  Once 07/22/24 1601 07/22/24 2031   07/22/24 1645  ampicillin  (OMNIPEN) 2 g in sodium chloride  0.9 % 100 mL IVPB  Status:  Discontinued        2 g 300 mL/hr over 20 Minutes Intravenous Every 6 hours 07/22/24 1557 07/24/24 1800   07/19/24 0530  meropenem  (MERREM ) 1 g in sodium chloride  0.9 % 100 mL IVPB  Status:  Discontinued        1 g 200 mL/hr over 30 Minutes Intravenous Every 8 hours 07/19/24 0438 07/22/24 1601   07/16/24 0900  ceFEPIme  (MAXIPIME ) 2 g in sodium chloride  0.9 % 100 mL IVPB  Status:  Discontinued        2 g 200 mL/hr over 30 Minutes Intravenous Every  8 hours 07/16/24 0811 07/19/24 0409   07/16/24 0815  ceFEPIme  (MAXIPIME ) 2 g in sodium  chloride 0.9 % 100 mL IVPB  Status:  Discontinued        2 g 200 mL/hr over 30 Minutes Intravenous  Once 07/16/24 0803 07/16/24 0811   07/16/24 0330  cefTRIAXone  (ROCEPHIN ) 2 g in sodium chloride  0.9 % 100 mL IVPB        2 g 200 mL/hr over 30 Minutes Intravenous  Once 07/16/24 0315 07/16/24 0412        CONSULTS-palliative care, neurology  Code Status: DNR  Family Communication: Discussed with patient's husband at bedside     Subjective   Having low-grade fever.  Objective    Physical Examination:  Appears lethargic Alert, perioral tremors, does respond to verbal commands S1-S2, regular Lungs are clear to auscultation bilaterally Abdomen is soft, nontender, no organomegaly    Wound 07/25/24 1100 Pressure Injury Sacrum Medial Deep Tissue Pressure Injury - Purple or maroon localized area of discolored intact skin or blood-filled blister due to damage of underlying soft tissue from pressure and/or shear. (Active)     Wound 08/11/24 0415 Pressure Injury Vertebral column Medial Stage 2 -  Partial thickness loss of dermis presenting as a shallow open injury with a red, pink wound bed without slough. (Active)        Sabas GORMAN Brod   [facility] If 7PM-7AM, please contact night-coverage at [email or web address], Office  [phone]   08/15/2024, 4:37 PM  LOS: 29 days

## 2024-08-15 NOTE — Procedures (Signed)
 Patient Name: Brenda [name]  MRN: [id number]  Epilepsy Attending: Arlin MALVA Krebs  Referring Physician/Provider: Waddell Karna LABOR, NP  Duration: 08/14/2024 1128 to 08/15/2024 1128  Patient history: 63yo F with seizure like activity. EEG to evaluate for seizure  Level of alertness: Awake, asleep  AEDs during EEG study: LEV  Technical aspects: This EEG study was done with scalp electrodes positioned according to the 10-20 International system of electrode placement. Electrical activity was reviewed with band pass filter of 1-70Hz , sensitivity of 7 uV/mm, display speed of 66mm/sec with a 60Hz  notched filter applied as appropriate. EEG data were recorded continuously and digitally stored.  Video monitoring was available and reviewed as appropriate.  Description: The posterior dominant rhythm consists of 5-6 Hz activity of moderate voltage (25-35 uV) seen predominantly in posterior head regions, symmetric and reactive to eye opening and eye closing. Sleep was characterized by sleep spindles (12 to 14 Hz), maximal frontocentral region. There is continuous generalized polymorphic, at times sharply contoured 3 to 6 Hz theta-delta slowing. Hyperventilation and photic stimulation were not performed.     ABNORMALITY - Continuous slow, generalized  IMPRESSION: This study is suggestive of generalized cerebral dysfunction (encephalopathy). No seizures or definite epileptiform discharges were seen throughout the recording.  Alson Mcpheeters O Teneisha Gignac

## 2024-08-15 NOTE — Plan of Care (Signed)
°  Problem: Metabolic: Goal: Ability to maintain appropriate glucose levels will improve Outcome: Progressing   Problem: Nutritional: Goal: Maintenance of adequate nutrition will improve Outcome: Progressing   Problem: Skin Integrity: Goal: Risk for impaired skin integrity will decrease Outcome: Progressing   Problem: Clinical Measurements: Goal: Respiratory complications will improve Outcome: Progressing Goal: Cardiovascular complication will be avoided Outcome: Progressing   Problem: Elimination: Goal: Will not experience complications related to bowel motility Outcome: Progressing   Problem: Skin Integrity: Goal: Risk for impaired skin integrity will decrease Outcome: Progressing

## 2024-08-16 ENCOUNTER — Inpatient Hospital Stay (HOSPITAL_COMMUNITY)

## 2024-08-16 LAB — CBC WITH DIFFERENTIAL/PLATELET
Abs Immature Granulocytes: 0.07 K/uL (ref 0.00–0.07)
Basophils Absolute: 0 K/uL (ref 0.0–0.1)
Basophils Relative: 0 %
Eosinophils Absolute: 0.1 K/uL (ref 0.0–0.5)
Eosinophils Relative: 1 %
HCT: 34 % — ABNORMAL LOW (ref 36.0–46.0)
Hemoglobin: 11 g/dL — ABNORMAL LOW (ref 12.0–15.0)
Immature Granulocytes: 1 %
Lymphocytes Relative: 13 %
Lymphs Abs: 1.6 K/uL (ref 0.7–4.0)
MCH: 29 pg (ref 26.0–34.0)
MCHC: 32.4 g/dL (ref 30.0–36.0)
MCV: 89.7 fL (ref 80.0–100.0)
Monocytes Absolute: 0.9 K/uL (ref 0.1–1.0)
Monocytes Relative: 7 %
Neutro Abs: 9.8 K/uL — ABNORMAL HIGH (ref 1.7–7.7)
Neutrophils Relative %: 78 %
Platelets: 371 K/uL (ref 150–400)
RBC: 3.79 MIL/uL — ABNORMAL LOW (ref 3.87–5.11)
RDW: 19.7 % — ABNORMAL HIGH (ref 11.5–15.5)
WBC: 12.4 K/uL — ABNORMAL HIGH (ref 4.0–10.5)
nRBC: 0 % (ref 0.0–0.2)

## 2024-08-16 LAB — GLUCOSE, CAPILLARY
Glucose-Capillary: 319 mg/dL — ABNORMAL HIGH (ref 70–99)
Glucose-Capillary: 105 mg/dL — ABNORMAL HIGH (ref 70–99)
Glucose-Capillary: 117 mg/dL — ABNORMAL HIGH (ref 70–99)
Glucose-Capillary: 157 mg/dL — ABNORMAL HIGH (ref 70–99)
Glucose-Capillary: 208 mg/dL — ABNORMAL HIGH (ref 70–99)

## 2024-08-16 LAB — COMPREHENSIVE METABOLIC PANEL WITH GFR
ALT: 98 U/L — ABNORMAL HIGH (ref 0–44)
AST: 43 U/L — ABNORMAL HIGH (ref 15–41)
Albumin: 2.6 g/dL — ABNORMAL LOW (ref 3.5–5.0)
Alkaline Phosphatase: 163 U/L — ABNORMAL HIGH (ref 38–126)
Anion gap: 7 (ref 5–15)
BUN: 28 mg/dL — ABNORMAL HIGH (ref 8–23)
CO2: 28 mmol/L (ref 22–32)
Calcium: 8.9 mg/dL (ref 8.9–10.3)
Chloride: 103 mmol/L (ref 98–111)
Creatinine, Ser: 0.4 mg/dL — ABNORMAL LOW (ref 0.44–1.00)
GFR, Estimated: >60 mL/min (ref >60)
Glucose, Bld: 120 mg/dL — ABNORMAL HIGH (ref 70–99)
Potassium: 4 mmol/L (ref 3.5–5.1)
Sodium: 138 mmol/L (ref 135–145)
Total Bilirubin: 0.6 mg/dL (ref 0.0–1.2)
Total Protein: 6.1 g/dL — ABNORMAL LOW (ref 6.5–8.1)

## 2024-08-16 MED ORDER — SODIUM CHLORIDE 0.9 % IV SOLN
2.0000 g | INTRAVENOUS | Status: AC
  Administered 2024-08-16 – 2024-08-20 (×5): 2 g via INTRAVENOUS
  Filled 2024-08-16 (×5): qty 20

## 2024-08-16 MED ORDER — INSULIN GLARGINE 100 UNIT/ML ~~LOC~~ SOLN
18.0000 [IU] | Freq: Two times a day (BID) | SUBCUTANEOUS | Status: DC
  Administered 2024-08-16 – 2024-08-18 (×4): 18 [IU] via SUBCUTANEOUS
  Filled 2024-08-16 (×5): qty 0.18

## 2024-08-16 MED ORDER — INSULIN ASPART 100 UNIT/ML IJ SOLN
0.0000 [IU] | INTRAMUSCULAR | Status: DC
  Administered 2024-08-16: 3 [IU] via SUBCUTANEOUS
  Administered 2024-08-16: 5 [IU] via SUBCUTANEOUS
  Administered 2024-08-17: 17:00:00 3 [IU] via SUBCUTANEOUS
  Administered 2024-08-17: 11:00:00 11 [IU] via SUBCUTANEOUS
  Administered 2024-08-17: 01:00:00 5 [IU] via SUBCUTANEOUS
  Administered 2024-08-17: 13:00:00 8 [IU] via SUBCUTANEOUS
  Administered 2024-08-17 (×2): 3 [IU] via SUBCUTANEOUS
  Administered 2024-08-18: 01:00:00 2 [IU] via SUBCUTANEOUS
  Administered 2024-08-18: 12:00:00 8 [IU] via SUBCUTANEOUS
  Administered 2024-08-18: 11:00:00 5 [IU] via SUBCUTANEOUS
  Administered 2024-08-18: 05:00:00 3 [IU] via SUBCUTANEOUS
  Filled 2024-08-16: qty 5
  Filled 2024-08-16 (×2): qty 3
  Filled 2024-08-16: qty 8
  Filled 2024-08-16 (×4): qty 3
  Filled 2024-08-16: qty 5
  Filled 2024-08-16: qty 3
  Filled 2024-08-16: qty 8

## 2024-08-16 MED ADMIN — Carbidopa & Levodopa Tab 25-100 MG: 1 | NDC 60687066111

## 2024-08-16 NOTE — Progress Notes (Signed)
 LTM VIDEO EEG discontinued - no skin breakdown at [facility].

## 2024-08-16 NOTE — Plan of Care (Signed)
°  Problem: Metabolic: Goal: Ability to maintain appropriate glucose levels will improve Outcome: Progressing   Problem: Nutritional: Goal: Maintenance of adequate nutrition will improve Outcome: Progressing   Problem: Skin Integrity: Goal: Risk for impaired skin integrity will decrease Outcome: Progressing   Problem: Tissue Perfusion: Goal: Adequacy of tissue perfusion will improve Outcome: Progressing   Problem: Clinical Measurements: Goal: Respiratory complications will improve Outcome: Progressing Goal: Cardiovascular complication will be avoided Outcome: Progressing   Problem: Elimination: Goal: Will not experience complications related to bowel motility Outcome: Progressing

## 2024-08-16 NOTE — Progress Notes (Addendum)
 PROGRESS NOTE    Brenda [name]  FMW:[id number] DOB: 12-01-1960 DOA: 07/15/2024 PCP: [facility]  No chief complaint on file.   Brief Narrative:   63 year old female with history of metastatic endometrial cancer, chronic cancer-related pain, hypertension, hyperlipidemia, diabetes mellitus, CVA, history of DVT and PE anticoagulated on Lovenox , OSA, chronic hypoxic respiratory failure on 2L via nasal cannula and recent hospitalization and discharged on 07/14/2024 for UTI treated with IV antibiotics followed by oral antibiotics on discharge presented with chills and weakness.  She was found to have UTI and was started on IV cefepime .  Unfortunately mental status worsened and she was transferred to [facility] for continuous EEG monitoring with 4 seizures noted on video EEG. She was on Keppra  11/16. Placed on empiric broad-spectrum antibiotics for possible meningitis/encephalitis which was ruled out based on CSF results. .    Neurology has been reconsulted 12/7 with development of rigidity.  Oncology has been reconsulted.  Family has begun to discuss palliative care, but at this point managing her cancer related pain and working on rigidity with neurology.  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  I had Omeka Holben conversation with them  about comfort focused care and hospice, but at this point, we're trying to work up and treat the acute issues below (with regards to her rigidity and encephalopathy).  Currently allowing time for outcomes with neurology's evaluation and treatment of her acute issues below.  Further decisions will be pending her response to treatment.    Rigidity  Seizures Note 11/15 she had stiffening of upper extremities and was transferred to [facility] for Continuous EEG. 4 seizures noted 11/16 S/p LP, meningitis ruled out Her husband notes worsening stiffness/rigidity/inability to move extremities starting about 1 week ago  EEG without epileptiform abnormalities head CT without acute intracranial abnormality MRI brain with evolving infarcts - MRI C spine without evidence of metastatic disease Continue keppra   appreciate neurology recommendations - ddx includes PD-1 inhibitor induced side effect, serotonin syndrome.  We've stopped prozac  (low dose, lower suspicion for serotonin syndrome?).  S/p high dose steroids 12/9-11.  S/p trial sinimet.  S/p baclofen .  They're discussing IVIG/plasma exchange.  Acute Metabolic Encephalopathy Sounds like suspicion that this and seizures above related to cefepime  Delirium precautions  Seems worse today, related to above Caution with pain meds, will d/c scheduled nightly oxy - issues with somnolence on fentanyl , will balance need for opiates with sedation  Klebsiella Pneumoniae UTI Leukocytosis Pyuria Afebrile, but several temps around 99 treating with abx for UTI, follow culture Blood cultures pending  Metastatic Endometrial Cancer Cancer Related Pain  Appreciate oncology consult  CT chest/abdomen/pelvis for persistent (worsening?) back pain (note centrally necrotic infiltrative mass in L psoas in 06/2024) CT 12/7 with R psoas muscle mass like enlargement measuring 5.0 x 4.6 cm indeterminate  for tumor vs hematoma (recommended correlation with clinical status and  possible contrast enhanced mri or short interval follow imaging  - Hb is stable, consider repeat imaging in Brenda [name] week or so) - L psoas/paraspinal mass decreased in size.   Appreciate oncology needs Will continue to adjust pain meds  Elevated LFT's Monitor   Dysphagia Cortrak + tube feeds  Depression Anxiety Prozac  discontinued with rigidity  Anemia S/p 1 unit pRBC 12/1 S/p IV iron   Normal folate, b12.  Normal iron , ferritin.   Pulmonary Embolism Splenic Infarct Therapeutic lovenox  (see possible hematoma above, low threshold to hold anticoagulation if drop in H/H or sudden worsening abdominal pain)  CAD  Hx Stroke On therapeutic lovenox  above  Hypernatremia Resolved  Abnormal Thyroid  Function Tests Repeat TSH and free T4 wnl Negative TSI On propanolol   T2DM Lantus , SSI - adjust as needed with completion of high dose steroids (12/9-11)  Pressure Ulcer Wound 07/25/24 1100 Pressure Injury Sacrum Medial Deep Tissue Pressure Injury - Purple or maroon localized area of discolored intact skin or blood-filled blister due to damage of underlying soft tissue from pressure and/or shear. (Active)     Wound 08/11/24 0415 Pressure Injury Vertebral column Medial Stage 2 -  Partial thickness loss of dermis presenting as Anas Reister shallow open injury with Estevan Kersh red, pink wound bed without slough. (Active)   Obesity Body mass index is 34.02 kg/m.     DVT prophylaxis: lovenox  Code Status: DNR Family Communication: older daughter 12/10 at bedside - called husband 12/14, straight to voicemail  Disposition:   Status is: Inpatient Remains inpatient appropriate because: need for continued inpatient care   Consultants:  Neurology Palliative care oncology  Procedures:  11/21 LP  EEG   Antimicrobials:  Anti-infectives (From admission, onward)    Start     Dose/Rate Route Frequency Ordered Stop   08/16/24 1100  cefTRIAXone  (ROCEPHIN ) 2 g in sodium chloride  0.9 % 100 mL IVPB        2  g 200 mL/hr over 30 Minutes Intravenous Every 24 hours 08/16/24 1001     07/23/24 0600  vancomycin  (VANCOCIN ) IVPB 1000 mg/200 mL premix  Status:  Discontinued        1,000 mg 200 mL/hr over 60 Minutes Intravenous Every 12 hours 07/22/24 1834 07/24/24 1808   07/22/24 2200  meropenem  (MERREM ) 2 g in sodium chloride  0.9 % 100 mL IVPB  Status:  Discontinued        2 g 280 mL/hr over 30 Minutes Intravenous Every 8 hours 07/22/24 1601 07/24/24 1808   07/22/24 1700  vancomycin  (VANCOREADY) IVPB 1500 mg/300 mL        1,500 mg 150 mL/hr over 120 Minutes Intravenous  Once 07/22/24 1601 07/22/24 2031   07/22/24 1645  ampicillin  (OMNIPEN) 2 g in sodium chloride  0.9 % 100 mL IVPB  Status:  Discontinued        2 g 300 mL/hr over 20 Minutes Intravenous Every 6 hours 07/22/24 1557 07/24/24 1800   07/19/24 0530  meropenem  (MERREM ) 1 g in sodium chloride  0.9 % 100 mL IVPB  Status:  Discontinued        1 g 200 mL/hr over 30 Minutes Intravenous Every 8 hours 07/19/24 0438 07/22/24 1601   07/16/24 0900  ceFEPIme  (MAXIPIME ) 2 g in sodium chloride  0.9 % 100 mL IVPB  Status:  Discontinued        2 g 200 mL/hr over 30 Minutes Intravenous Every 8 hours 07/16/24 0811 07/19/24 0409  07/16/24 0815  ceFEPIme  (MAXIPIME ) 2 g in sodium chloride  0.9 % 100 mL IVPB  Status:  Discontinued        2 g 200 mL/hr over 30 Minutes Intravenous  Once 07/16/24 0803 07/16/24 0811   07/16/24 0330  cefTRIAXone  (ROCEPHIN ) 2 g in sodium chloride  0.9 % 100 mL IVPB        2 g 200 mL/hr over 30 Minutes Intravenous  Once 07/16/24 0315 07/16/24 0412       Subjective:  No one at bedside Nonverbal today  Objective: Vitals:   08/15/24 2000 08/16/24 0230 08/16/24 0830 08/16/24 1138  BP: 119/64 122/65 (!) 149/76 (!) 148/68  Pulse: 74 69 (!) 101 80  Resp: 18 16 18    Temp: 98.5 F (36.9 C) 97.9 F (36.6 C) 99.7 F (37.6 C) 99 F (37.2 C)  TempSrc: Axillary Axillary Oral Oral  SpO2: 98% 98% 99% 100%  Weight:      Height:         Intake/Output Summary (Last 24 hours) at 08/16/2024 1504 Last data filed at 08/16/2024 1200 Gross per 24 hour  Intake 450 ml  Output 1400 ml  Net -950 ml   Filed Weights   08/10/24 0500 08/11/24 0500 08/12/24 0345  Weight: 83.5 kg 84.4 kg 84.4 kg    Examination:  General: ill appearing Cardiovascular: RRR Lungs: unlabored Abdomen: Soft, nontender, nondistended Neurological: not awakening to voice or sternal rub (worse than when I saw her last), remains rigid Extremities: No clubbing or cyanosis. No edema.   Data Reviewed: I have personally reviewed following labs and imaging studies  CBC: Recent Labs  Lab 08/10/24 0405 08/11/24 0540 08/12/24 0300 08/13/24 0525 08/15/24 0545 08/16/24 0830  WBC 8.6 8.2 7.9 7.5 8.5 12.4*  NEUTROABS 6.2 6.0 7.6 6.6  --  9.8*  HGB 8.6* 9.6* 10.6* 10.0* 10.8* 11.0*  HCT 27.1* 30.0* 33.2* 31.2* 33.0* 34.0*  MCV 89.7 89.8 89.7 91.0 91.7 89.7  PLT 266 277 290 321 352 371    Basic Metabolic Panel: Recent Labs  Lab 08/10/24 0405 08/11/24 0540 08/12/24 0300 08/13/24 0525 08/15/24 0545 08/16/24 0830  NA 136 137 134* 135 139 138  K 4.3 4.3 4.2 4.1 4.2 4.0  CL 103 101 100 100 104 103  CO2 28 25 28 29 28 28   GLUCOSE 135* 103* 278* 227* 208* 120*  BUN 15 16 17  31* 24* 28*  CREATININE 0.37* 0.66 0.58 0.46 0.49 0.40*  CALCIUM  8.4* 8.5* 8.5* 8.7* 8.7* 8.9  MG 1.8 1.9 1.9 2.1  --   --   PHOS 3.8 3.8 3.3 3.0  --   --     GFR: Estimated Creatinine Clearance: 74.4 mL/min (Casin Federici) (by C-G formula based on SCr of 0.4 mg/dL (L)).  Liver Function Tests: Recent Labs  Lab 08/11/24 0540 08/12/24 0300 08/13/24 0525 08/15/24 0545 08/16/24 0830  AST 124* 77* 33 50* 43*  ALT 210* 186* 123* 62* 98*  ALKPHOS 258* 255* 213* 167* 163*  BILITOT 0.6 0.7 0.6 0.5 0.6  PROT 6.2* 6.9 6.3* 6.1* 6.1*  ALBUMIN 2.4* 2.5* 2.4* 2.6* 2.6*    CBG: Recent Labs  Lab 08/15/24 2055 08/15/24 2350 08/16/24 0349 08/16/24 0852 08/16/24 1137  GLUCAP 289*  355* 319* 105* 117*     Recent Results (from the past 240 hours)  Culture, Urine (Do not remove urinary catheter, catheter placed by urology or difficult to place)     Status: Abnormal (Preliminary result)   Collection Time: 08/15/24 10:34  AM   Specimen: Urine, Catheterized  Result Value Ref Range Status   Specimen Description URINE, CATHETERIZED  Final   Special Requests   Final    NONE Performed at [facility], [address], [place], [place] [address]    Culture >=100,000 COLONIES/mL GRAM NEGATIVE RODS (Jaivyn Gulla)  Final   Report Status PENDING  Incomplete  Culture, blood (Routine X 2) w Reflex to ID Panel     Status: None (Preliminary result)   Collection Time: 08/15/24 12:41 PM   Specimen: BLOOD RIGHT HAND  Result Value Ref Range Status   Specimen Description BLOOD RIGHT HAND  Final   Special Requests   Final    BOTTLES DRAWN AEROBIC AND ANAEROBIC Blood Culture adequate volume   Culture   Final    NO GROWTH < 24 HOURS Performed at [facility], [address] [place], [place] [address]    Report Status PENDING  Incomplete  Culture, blood (Routine X 2) w Reflex to ID Panel     Status: None (Preliminary result)   Collection Time: 08/15/24 12:41 PM   Specimen: BLOOD LEFT HAND  Result Value Ref Range Status   Specimen Description BLOOD LEFT HAND  Final   Special Requests   Final    BOTTLES DRAWN AEROBIC AND ANAEROBIC Blood Culture adequate volume   Culture   Final    NO GROWTH < 24 HOURS Performed at [facility], [address], [place], [place] [address]    Report Status PENDING  Incomplete         Radiology Studies: DG Chest Port 1V same Day Result Date: 08/15/2024 CLINICAL DATA:  [id number] Fever [id number] EXAM: PORTABLE CHEST 1 VIEW COMPARISON:  July 16, 2024, July 12, 2024 FINDINGS: The cardiomediastinal silhouette is unchanged in contour.Low lung volume radiograph. Elevation of the RIGHT hemidiaphragm. RIGHT chest port with tip terminating over  the superior cavoatrial junction. Enteric tube tip terminates over the proximal duodenum. No pleural effusion. No pneumothorax. RIGHT midlung bandlike opacity. IMPRESSION: RIGHT midlung bandlike opacity, likely atelectasis. Electronically Signed   By: Corean Salter M.D.   On: 08/15/2024 16:01   Overnight EEG with video Result Date: 08/15/2024 Shelton Arlin KIDD, MD     08/16/2024  9:14 AM Patient Name: Brenda [name] MRN: [id number] Epilepsy Attending: Arlin KIDD Shelton Referring Physician/Provider: Waddell Karna LABOR, NP Duration: 08/14/2024 1128 to 08/15/2024 1128 Patient history: 63yo F with seizure like activity. EEG to evaluate for seizure Level of alertness: Awake, asleep AEDs during EEG study: LEV Technical aspects: This EEG study was done with scalp electrodes positioned according to the 10-20 International system of electrode placement. Electrical activity was reviewed with band pass filter of 1-70Hz , sensitivity of 7 uV/mm, display speed of 50mm/sec with Kieron Kantner 60Hz  notched filter applied as appropriate. EEG data were recorded continuously and digitally stored.  Video monitoring was available and reviewed as appropriate. Description: The posterior dominant rhythm consists of 5-6 Hz activity of moderate voltage (25-35 uV) seen predominantly in posterior head regions, symmetric and reactive to eye opening and eye closing. Sleep was characterized by sleep spindles (12 to 14 Hz), maximal frontocentral region. There is continuous generalized polymorphic, at times sharply contoured 3 to 6 Hz theta-delta slowing. Hyperventilation and photic stimulation were not performed.   ABNORMALITY - Continuous slow, generalized IMPRESSION: This study is suggestive of generalized cerebral dysfunction (encephalopathy). No seizures or definite epileptiform discharges were seen throughout the recording. Priyanka O Yadav  Scheduled Meds:  amLODipine   10 mg Per Tube Daily   atorvastatin   80 mg Per Tube QHS    Chlorhexidine  Gluconate Cloth  6 each Topical Daily   enoxaparin  (LOVENOX ) injection  1 mg/kg Subcutaneous Q12H   feeding supplement  237 mL Oral Q24H   feeding supplement (PROSource TF20)  60 mL Per Tube Daily   free water   75 mL Per Tube Q4H   insulin  aspart  0-20 Units Subcutaneous Q4H   insulin  glargine  25 Units Subcutaneous BID   levETIRAcetam   500 mg Per Tube BID   lidocaine   1 patch Transdermal Daily   propranolol   20 mg Per Tube TID   sodium chloride  flush  10-40 mL Intracatheter Q12H   Continuous Infusions:  cefTRIAXone  (ROCEPHIN )  IV 2 g (08/16/24 1114)   feeding supplement (JEVITY 1.5 CAL/FIBER) 1,000 mL (08/15/24 1422)     LOS: 30 days    Time spent: over 30 min     Meliton Monte, MD [facility]   To contact the attending provider between 7A-7P or the covering provider during after hours 7P-7A, please log into the web site [email or web address] and access using universal [place] password for that web site. If you do not have the password, please call the hospital operator.  08/16/2024, 3:04 PM

## 2024-08-16 NOTE — Progress Notes (Addendum)
 NEUROLOGY CONSULT FOLLOW UP NOTE   Date of service: August 16, 2024 Patient Name: Brenda [name] MRN:  [id number] DOB:  1958/05/26  Interval Hx/subjective  Patient seen and examined.  Remains very drowsy.  Continues to have resting tremor on the face LTM EEG negative for seizure.  Vitals   Vitals:   08/15/24 1533 08/15/24 2000 08/16/24 0230 08/16/24 0830  BP: 135/75 119/64 122/65 (!) 149/76  Pulse: 86 74 69 (!) 101  Resp: 18 18 16 18   Temp: 97.8 F (36.6 C) 98.5 F (36.9 C) 97.9 F (36.6 C) 99.7 F (37.6 C)  TempSrc: Oral Axillary Axillary Oral  SpO2: 98% 98% 98% 99%  Weight:      Height:         Body mass index is 34.02 kg/m.  Physical Exam   General: Awake, alert and oriented to self. HEENT: Alopecia and facial hirsutism noted CVS: Regular rhythm Abdomen nondistended nontender Neurological exam More drowsy today Follow simple commands but not as swiftly as she did yesterday No gross cranial nerve deficits On motor examination, she does have increased tone-today there was more cogwheeling noted in the upper extremities as well but the increased tone is actually better than before. Sensation appears intact to light touch Pretty much unchanged from yesterday    Medications  Current Facility-Administered Medications:    acetaminophen  (TYLENOL ) 160 MG/5ML solution 500 mg, 500 mg, Per Tube, Q6H PRN, Drusilla Sabas RAMAN, MD, 500 mg at 08/15/24 9061   amLODipine  (NORVASC ) tablet 10 mg, 10 mg, Per Tube, Daily, Drusilla, Sabas RAMAN, MD, 10 mg at 08/16/24 9065   artificial tears (LACRILUBE) ophthalmic ointment, , Both Eyes, Q4H PRN, Tariq, Hassan, MD, Given at 08/11/24 2102   atorvastatin  (LIPITOR ) tablet 80 mg, 80 mg, Per Tube, QHS, Sigdel, Santosh, MD, 80 mg at 08/15/24 2234   cefTRIAXone  (ROCEPHIN ) 1 g in sodium chloride  0.9 % 100 mL IVPB, 1 g, Intravenous, Q24H, Powell, A Meliton Raddle., MD   Chlorhexidine  Gluconate Cloth 2 % PADS 6 each, 6 each, Topical, Daily, Chavez,  Abigail, NP, 6 each at 08/16/24 0925   enoxaparin  (LOVENOX ) injection 80 mg, 1 mg/kg, Subcutaneous, Q12H, Tariq, Hassan, MD, 80 mg at 08/16/24 0924   feeding supplement (ENSURE PLUS HIGH PROTEIN) liquid 237 mL, 237 mL, Oral, Q24H, Lama, Gagan S, MD, 237 mL at 08/12/24 1435   feeding supplement (JEVITY 1.5 CAL/FIBER) liquid 1,000 mL, 1,000 mL, Per Tube, Continuous, Cosette Blackwater, MD, Last Rate: 45 mL/hr at 08/15/24 1422, 1,000 mL at 08/15/24 1422   feeding supplement (PROSource TF20) liquid 60 mL, 60 mL, Per Tube, Daily, Sigdel, Santosh, MD, 60 mL at 08/16/24 0938   free water  75 mL, 75 mL, Per Tube, Q4H, Cosette Blackwater, MD, 75 mL at 08/16/24 0800   guaiFENesin  (ROBITUSSIN) 100 MG/5ML liquid 15 mL, 15 mL, Per Tube, Q6H PRN, Drusilla Sabas RAMAN, MD, 15 mL at 08/15/24 1433   insulin  aspart (novoLOG ) injection 0-20 Units, 0-20 Units, Subcutaneous, Q4H, Perri DELENA Meliton Raddle., MD, 15 Units at 08/16/24 0454   insulin  glargine (LANTUS ) injection 25 Units, 25 Units, Subcutaneous, BID, Perri DELENA Meliton Raddle., MD, 25 Units at 08/16/24 9057   levETIRAcetam  (KEPPRA ) tablet 500 mg, 500 mg, Per Tube, BID, Chen, Lydia D, RPH, 500 mg at 08/16/24 9065   lidocaine  (LIDODERM ) 5 % 1 patch, 1 patch, Transdermal, Daily, Alekh, Kshitiz, MD, 1 patch at 08/16/24 9074   loperamide  HCl (IMODIUM ) 1 MG/7.5ML suspension 2 mg, 2 mg, Per Tube, PRN, Merilee Linsey  I, RPH, 2 mg at 08/05/24 9178   morphine  (PF) 2 MG/ML injection 2 mg, 2 mg, Intravenous, Q2H PRN, 2 mg at 08/15/24 0057 **OR** morphine  (PF) 2 MG/ML injection 4 mg, 4 mg, Intravenous, Q2H PRN, Perri DELENA Meliton Mickey., MD, 4 mg at 08/12/24 2354   naloxone  (NARCAN ) injection 0.4 mg, 0.4 mg, Intravenous, PRN, Alfornia Madison, MD   ondansetron  (ZOFRAN ) tablet 4 mg, 4 mg, Per Tube, Q6H PRN **OR** ondansetron  (ZOFRAN ) injection 4 mg, 4 mg, Intravenous, Q6H PRN, Cosette Blackwater, MD   oxyCODONE  (Oxy IR/ROXICODONE ) immediate release tablet 2.5 mg, 2.5 mg, Per Tube, Q3H PRN, Cooper,  Josseline P, PA-C, 2.5 mg at 08/15/24 1838   prochlorperazine  (COMPAZINE ) tablet 10 mg, 10 mg, Oral, Q6H PRN, Foust, Katy L, NP   propranolol  (INDERAL ) tablet 20 mg, 20 mg, Per Tube, TID, Sigdel, Santosh, MD, 20 mg at 08/16/24 0934   senna-docusate (Senokot-S) tablet 1 tablet, 1 tablet, Oral, QHS PRN, Foust, Katy L, NP   sodium chloride  flush (NS) 0.9 % injection 10-40 mL, 10-40 mL, Intracatheter, Q12H, Sigdel, Santosh, MD, 10 mL at 08/16/24 0943   sodium chloride  flush (NS) 0.9 % injection 10-40 mL, 10-40 mL, Intracatheter, PRN, Mcarthur Pick, MD  Labs and Diagnostic Imaging   CBC:  Recent Labs  Lab 08/13/24 0525 08/15/24 0545 08/16/24 0830  WBC 7.5 8.5 12.4*  NEUTROABS 6.6  --  9.8*  HGB 10.0* 10.8* 11.0*  HCT 31.2* 33.0* 34.0*  MCV 91.0 91.7 89.7  PLT 321 352 371    Basic Metabolic Panel:  Lab Results  Component Value Date   NA 138 08/16/2024   K 4.0 08/16/2024   CO2 28 08/16/2024   GLUCOSE 120 (H) 08/16/2024   BUN 28 (H) 08/16/2024   CREATININE 0.40 (L) 08/16/2024   CALCIUM  8.9 08/16/2024   GFRNONAA >60 08/16/2024   GFRAA 94 12/16/2019   Lipid Panel:  Lab Results  Component Value Date   LDLCALC 28 05/30/2024   HgbA1c:  Lab Results  Component Value Date   HGBA1C 7.2 (H) 06/01/2024   Urine Drug Screen:     Component Value Date/Time   LABOPIA POSITIVE (A) 04/20/2024 1817   COCAINSCRNUR NONE DETECTED 04/20/2024 1817   LABBENZ NONE DETECTED 04/20/2024 1817   AMPHETMU NONE DETECTED 04/20/2024 1817   THCU NONE DETECTED 04/20/2024 1817   LABBARB NONE DETECTED 04/20/2024 1817    Alcohol Level     Component Value Date/Time   ETH <15 04/20/2024 1344   INR  Lab Results  Component Value Date   INR 1.0 07/16/2024   APTT  Lab Results  Component Value Date   APTT 67 (H) 06/21/2024   Imaging personally reviewed CT Head without contrast(Personally reviewed): 1. Chronic encephalomalacia changes in the right medial temporal and occipital lobes developed in  the interim. 2. Unchanged small calcified meningioma along left frontal convexity.   MRI Brain(Personally reviewed): 1. Similar evolving infarcts in the anterior and posterior circulation. 2. No new infarcts, progressive mass effect or acute hemorrhage.   Repeat MRI Brain 12/7 1. Evolving infarcts without new/interval acute abnormality. 2. No evidence of metastatic disease.   MRI C spine 12/7 1. No evidence of metastatic disease. 2. Mild foraminal stenosis on the left at C3-C4 and right at C5-C6 and C6-C7.  Assessment   Jacarra Bobak is a 63 y.o. female past history of CAD, DM, obesity, OSA, metastatic endometrial cancer, prior stroke, DVT, PE on anticoagulation, recurrent UTIs with concern for cefepime  toxicity causing seizures  for which she which she was started on Keppra  and cefepime  was discontinued, continues to have neurological worsening despite cessation of seizures and no new strokes on imaging.  Cervical spine imaging also unremarkable for acute process. At this time, stiffness on her exam and altered mental status has an unclear etiology but differentials include serotonin syndrome due to being on Prozac , which has been discontinued versus side effect/neurotoxicity from the PD-1 inhibitor pembrolizumab , which is being used to treat her cancer.  MRI is negative for acute strokes-she has had old strokes that are now resolving. Her CSF results do not favor acute inflammatory demyelinating  polyneuropathy/Guillain-Barr type of pathology.    She was only on a very low-dose of Prozac  which makes me think that serotonin syndrome may not be the true culprit here.   The resting tremor has been evaluated with EEG, which has been negative and also trial of Sinemet  which also did not impact it.  Impression: Altered mental status and tremors, evaluate for chemotherapy related neurotoxicity Tremors  Recommendations  3 days of steroids completed-no change in clinical status Next  step would be to consider plasma exchange.  I have had conversation in detail with the husband to explain that if we are considering this to be a side effect of the chemotherapy, specifically neurotoxicity, first-line treatment of steroids and second is to consider IVIG and plasma exchange.  In light of her recent strokes, so as to not make her a prothrombotic, plasma exchange would be a better option.  He needs time to think and discuss with his family.. Trial Sinemet  because of parkinsonian features-no relief with at least 2 days of trial.  Will DC since the mental status is also not very good-do not want any confounders Given the new tremors, LTM EEG was done-no evidence of seizures.  Will DC today. Supportive care per primary team as you are Anti GAD abs ordered (serum) Will continue to follow  Plan discussed today with Dr. Perri and the patient's husband at bedside. ______________________________________________________________________   Bonney Eligio Lav, MD [facility]

## 2024-08-16 NOTE — Procedures (Signed)
 Patient Name: Brenda [name]  MRN: [id number]  Epilepsy Attending: Arlin MALVA Krebs  Referring Physician/Provider: Waddell Karna LABOR, NP  Duration: 08/15/2024 1128 to 08/16/2024 1211   Patient history: 63yo F with seizure like activity. EEG to evaluate for seizure   Level of alertness: Awake, asleep   AEDs during EEG study: LEV   Technical aspects: This EEG study was done with scalp electrodes positioned according to the 10-20 International system of electrode placement. Electrical activity was reviewed with band pass filter of 1-70Hz , sensitivity of 7 uV/mm, display speed of 24mm/sec with a 60Hz  notched filter applied as appropriate. EEG data were recorded continuously and digitally stored.  Video monitoring was available and reviewed as appropriate.   Description: The posterior dominant rhythm consists of 5-6 Hz activity of moderate voltage (25-35 uV) seen predominantly in posterior head regions, symmetric and reactive to eye opening and eye closing. Sleep was characterized by sleep spindles (12 to 14 Hz), maximal frontocentral region. There is continuous generalized polymorphic, at times sharply contoured 3 to 6 Hz theta-delta slowing. Hyperventilation and photic stimulation were not performed.      ABNORMALITY - Continuous slow, generalized   IMPRESSION: This study is suggestive of generalized cerebral dysfunction (encephalopathy). No seizures or definite epileptiform discharges were seen throughout the recording.   Brenda [name]

## 2024-08-17 DIAGNOSIS — T45AX4A Poisoning by immune checkpoint inhibitors and immunostimulant drugs, undetermined, initial encounter: Secondary | ICD-10-CM

## 2024-08-17 DIAGNOSIS — T45AX5A Adverse effect of immune checkpoint inhibitors and immunostimulant drugs, initial encounter: Secondary | ICD-10-CM | POA: Diagnosis not present

## 2024-08-17 DIAGNOSIS — R4182 Altered mental status, unspecified: Secondary | ICD-10-CM | POA: Diagnosis not present

## 2024-08-17 LAB — GLUCOSE, CAPILLARY
Glucose-Capillary: 155 mg/dL — ABNORMAL HIGH (ref 70–99)
Glucose-Capillary: 183 mg/dL — ABNORMAL HIGH (ref 70–99)
Glucose-Capillary: 188 mg/dL — ABNORMAL HIGH (ref 70–99)
Glucose-Capillary: 215 mg/dL — ABNORMAL HIGH (ref 70–99)
Glucose-Capillary: 251 mg/dL — ABNORMAL HIGH (ref 70–99)
Glucose-Capillary: 280 mg/dL — ABNORMAL HIGH (ref 70–99)
Glucose-Capillary: 310 mg/dL — ABNORMAL HIGH (ref 70–99)

## 2024-08-17 LAB — CBC WITH DIFFERENTIAL/PLATELET
Abs Immature Granulocytes: 0.04 K/uL (ref 0.00–0.07)
Basophils Absolute: 0 K/uL (ref 0.0–0.1)
Basophils Relative: 0 %
Eosinophils Absolute: 0.2 K/uL (ref 0.0–0.5)
Eosinophils Relative: 2 %
HCT: 32.1 % — ABNORMAL LOW (ref 36.0–46.0)
Hemoglobin: 10.4 g/dL — ABNORMAL LOW (ref 12.0–15.0)
Immature Granulocytes: 0 %
Lymphocytes Relative: 13 %
Lymphs Abs: 1.2 K/uL (ref 0.7–4.0)
MCH: 29.7 pg (ref 26.0–34.0)
MCHC: 32.4 g/dL (ref 30.0–36.0)
MCV: 91.7 fL (ref 80.0–100.0)
Monocytes Absolute: 0.5 K/uL (ref 0.1–1.0)
Monocytes Relative: 6 %
Neutro Abs: 7.5 K/uL (ref 1.7–7.7)
Neutrophils Relative %: 79 %
Platelets: 287 K/uL (ref 150–400)
RBC: 3.5 MIL/uL — ABNORMAL LOW (ref 3.87–5.11)
RDW: 19.9 % — ABNORMAL HIGH (ref 11.5–15.5)
WBC: 9.5 K/uL (ref 4.0–10.5)
nRBC: 0 % (ref 0.0–0.2)

## 2024-08-17 LAB — URINE CULTURE: Culture: >=100000 — AB

## 2024-08-17 LAB — COMPREHENSIVE METABOLIC PANEL WITH GFR
ALT: 118 U/L — ABNORMAL HIGH (ref 0–44)
AST: 45 U/L — ABNORMAL HIGH (ref 15–41)
Albumin: 2.3 g/dL — ABNORMAL LOW (ref 3.5–5.0)
Alkaline Phosphatase: 143 U/L — ABNORMAL HIGH (ref 38–126)
Anion gap: 8 (ref 5–15)
BUN: 23 mg/dL (ref 8–23)
CO2: 28 mmol/L (ref 22–32)
Calcium: 8.2 mg/dL — ABNORMAL LOW (ref 8.9–10.3)
Chloride: 100 mmol/L (ref 98–111)
Creatinine, Ser: 0.47 mg/dL (ref 0.44–1.00)
GFR, Estimated: >60 mL/min (ref >60)
Glucose, Bld: 266 mg/dL — ABNORMAL HIGH (ref 70–99)
Potassium: 4.1 mmol/L (ref 3.5–5.1)
Sodium: 136 mmol/L (ref 135–145)
Total Bilirubin: 0.6 mg/dL (ref 0.0–1.2)
Total Protein: 5.6 g/dL — ABNORMAL LOW (ref 6.5–8.1)

## 2024-08-17 LAB — PHOSPHORUS: Phosphorus: 3.3 mg/dL (ref 2.5–4.6)

## 2024-08-17 LAB — MAGNESIUM: Magnesium: 1.9 mg/dL (ref 1.7–2.4)

## 2024-08-17 MED ORDER — INSULIN ASPART 100 UNIT/ML IJ SOLN
2.0000 [IU] | INTRAMUSCULAR | Status: DC
  Administered 2024-08-17 – 2024-08-18 (×7): 2 [IU] via SUBCUTANEOUS
  Filled 2024-08-17 (×7): qty 2

## 2024-08-17 MED ORDER — LORAZEPAM 2 MG/ML IJ SOLN
1.0000 mg | Freq: Once | INTRAMUSCULAR | Status: AC
  Administered 2024-08-17: 10:00:00 1 mg via INTRAVENOUS
  Filled 2024-08-17: qty 1

## 2024-08-17 NOTE — Progress Notes (Signed)
 IP PROGRESS NOTE  Subjective:   Ms. Reedy remains minimally responsive.  Her daughter was at the bedside when I saw her at approximately 6:45 AM.  Her daughter reports Ms. Hopwood speaks a few words.    Objective: Vital signs in last 24 hours: Blood pressure (!) 113/56, pulse 72, temperature 99.9 F (37.7 C), temperature source Oral, resp. rate 18, height 5' 2 (1.575 m), weight 186 lb (84.4 kg), last menstrual period 10/30/2014, SpO2 100%.  Intake/Output from previous day: 12/14 0701 - 12/15 0700 In: 3254.5 [NG/GT:3154.5; IV Piggyback:100] Out: 1375 [Urine:1375]  Physical Exam:  Neurologic: [place] okay when I asked how she was feeling, no other verbal response.  Not following commands, mild tremor of the face, moves toes with noxious stimuli     Portacath/PICC-without erythema  Lab Results: Recent Labs    08/16/24 0830 08/17/24 0702  WBC 12.4* 9.5  HGB 11.0* 10.4*  HCT 34.0* 32.1*  PLT 371 287    BMET Recent Labs    08/16/24 0830 08/17/24 0702  NA 138 136  K 4.0 4.1  CL 103 100  CO2 28 28  GLUCOSE 120* 266*  BUN 28* 23  CREATININE 0.40* 0.47  CALCIUM  8.9 8.2*     Medications: I have reviewed the patient's current medications.  Assessment/Plan:  Endometrial cancer Diagnosed in 2023, status post surgery and radiation Recurrent disease July 2025 with a left retroperitoneal mass, biopsy confirmed adenocarcinoma, ER positive, MSI-high, tumor mutation burden 24, PD-L1 75%, PIK 3CA mutated, PTEN mutated, HER2 1+, loss of MLH1 expression 04/03/2024 CT chest: Spiculated 9 x 12 mm right upper lobe and part solid 4 x 6 mm right upper lobe nodules Cycle 1 paclitaxel /carboplatin /pembrolizumab  05/01/2024 Cycle 2 paclitaxel /carboplatin /pembrolizumab  06/30/2024 06/19/2024 CT abdomen/pelvis: Right lower lobe pulmonary embolism, splenic infarct, stable size of the left psoas mass with evidence of erosive change at the anterior aspect of L2, decreased size of left  external iliac node 08/09/2024 CTs: New psoas muscle mass (tumor versus hematoma), left psoas/paraspinal mass has decreased in size, by my review the previously noted lung nodules have resolved  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  DVT 06/02/2024 4.  Pulmonary embolism noted on CT chest 06/19/2024 5.  Diabetes 6.  CHF/CAD 7.  Seizures, cefepime  toxicity?  November 2025 8.  Progressive generalized loss of motor function-related to CVAs?,  Paraneoplastic? 08/11/2024-Solu-Medrol  daily x 3 9.  Respiratory failure 10.  Pain secondary to #1 11.  Anemia 12.  Elevated liver enzymes  Ms. Hassing has a history of endometrial cancer initially diagnosed in 2020.  She was diagnosed with recurrent disease involving a left retroperitoneal mass in July 2025.  She completed 2 treatments with paclitaxel /carboplatin /pembrolizumab .  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  She completed 2 cycles of systemic therapy for treatment of the uterine cancer.  I reviewed the 08/09/2024 restaging CT findings with Ms. Twining and her daughter.  The known malignancy at the left retroperitoneum has improved significantly.  The significance of the right psoas mass is unclear.  I suspect this is a benign finding.      The etiology of the diffuse generalized weakness is unclear.  There is likely a component related to the embolic CVAs and deconditioning, but the  evolution of the loss of motor function over the past several weeks appears to be more severe than expected.  She has undergone an extensive neurologic evaluation over the past month.  Neurology is following her in the hospital..  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  She started high-dose Solu-Medrol  on 08/11/2024, given daily for 3 days.  Her neurologic status has not improved.  She is less alert over the past several days.  I discussed the case with neurology this morning.  The etiology of her current neurologic status is unclear.  They are considering a trial of plasma exchange in case her symptoms are related to toxicity from pembrolizumab  or a paraneoplastic syndrome.   The etiology of her pain is unclear based on the restaging CT.  The retroperitoneal mass is much smaller.  She could be having pain from the right psoas lesion or another etiology.  Her pain appears improved over the past few days.  She has taken morphine  infrequently.  The hemoglobin is stable.   Recommendations: Continue narcotic analgesics as needed for pain Continue evaluation of her mental status, seizures, and motor weakness per neurology Anticoagulation for the DVT/PE and CVAs, will need to consider holding anticoagulation if the hemoglobin drops significantly  tube feedings, follow-up with speech pathology Elevated liver enzymes-evaluate per the medical service, Lipitor ? Systemic treatment for the uterine cancer will remain on hold Antibiotics for the urinary tract infection per the medical service             LOS: 31 days   Arley Hof, MD   08/17/2024, 1:10 PM

## 2024-08-17 NOTE — Inpatient Diabetes Management (Signed)
 Inpatient Diabetes Program Recommendations  AACE/ADA: New Consensus Statement on Inpatient Glycemic Control (2015)  Target Ranges:  Prepandial:   less than 140 mg/dL      Peak postprandial:   less than 180 mg/dL (1-2 hours)      Critically ill patients:  140 - 180 mg/dL   Lab Results  Component Value Date   GLUCAP 310 (H) 08/17/2024   HGBA1C 7.2 (H) 06/01/2024    Review of Glycemic Control  Latest Reference Range & Units 08/16/24 16:14 08/16/24 21:02 08/17/24 00:32 08/17/24 03:59 08/17/24 07:15 08/17/24 10:35  Glucose-Capillary 70 - 99 mg/dL 842 (H) 791 (H) 784 (H) 183 (H) 251 (H) 310 (H)   Diabetes history: DM 2 Outpatient Diabetes medications:  Fiasp  1-3 tid with meals Tresiba  18 units daily Metformin  1000 mg bid Current orders for Inpatient glycemic control:  Novolog  0-15 units q 4 hours Jevity 45 ml/hr Lantus  15 units bid Inpatient Diabetes Program Recommendations:    If appropriate, consider adding Novolog  tube feed coverage 2 units q 4 hours.  Thanks,  Randall Bullocks, RN, BC-ADM Inpatient Diabetes Coordinator Pager [phone]  (8a-5p)

## 2024-08-17 NOTE — Progress Notes (Signed)
 PROGRESS NOTE    Brenda [name]  FMW:[id number] DOB: 02/08/1961 DOA: 07/15/2024 PCP: [facility]  No chief complaint on file.   Brief Narrative:   63 year old female with history of metastatic endometrial cancer, chronic cancer-related pain, hypertension, hyperlipidemia, diabetes mellitus, CVA, history of DVT and PE anticoagulated on Lovenox , OSA, chronic hypoxic respiratory failure on 2L via nasal cannula and recent hospitalization and discharged on 07/14/2024 for UTI treated with IV antibiotics followed by oral antibiotics on discharge presented with chills and weakness.  She was found to have UTI and was started on IV cefepime .  Unfortunately mental status worsened and she was transferred to [facility] for continuous EEG monitoring with 4 seizures noted on video EEG. She was on Keppra  11/16. Placed on empiric broad-spectrum antibiotics for possible meningitis/encephalitis which was ruled out based on CSF results. .    Neurology has been reconsulted 12/7 with development of rigidity.  Oncology has been reconsulted.  Family has begun to discuss palliative care, but at this point managing her cancer related pain and working on rigidity with neurology.  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 Kinnick and her husband beginning to talk about palliative care on the day I met them (12/7).  I had Mikalah Skyles conversation with them  about comfort focused care and hospice, but at this point, we're trying to work up and treat the acute issues below (with regards to her rigidity and encephalopathy).  Currently allowing time for outcomes with neurology's evaluation and treatment of her acute issues below.  Further decisions will be pending her response to treatment.    Rigidity  Seizures Note 11/15 she had stiffening of upper extremities and was transferred to [facility] for Continuous EEG. 4 seizures noted 11/16 S/p LP, meningitis ruled out Her husband notes worsening stiffness/rigidity/inability to move extremities starting about 1 week ago  EEG without epileptiform abnormalities head CT without acute intracranial abnormality MRI brain with evolving infarcts - MRI C spine without evidence of metastatic disease Continue keppra   appreciate neurology recommendations - ddx includes PD-1 inhibitor induced side effect, paraneoplatic syndrome, stiff person syndrome.  Considering catatonia.  We've stopped prozac  (low dose, lower suspicion for serotonin syndrome?).  S/p high dose steroids 12/9-11.  S/p trial sinimet.  S/p baclofen .  They're discussing IVIG/plasma exchange.  Follow paraneoplastic panel, GAD65 ab.  Acute Metabolic Encephalopathy Sounds like suspicion that this and seizures above related to cefepime  Delirium precautions  Seems worse today, related to above Caution with pain meds, will d/c scheduled nightly oxy - issues with somnolence on fentanyl , will balance need for opiates with sedation  Klebsiella Pneumoniae UTI Leukocytosis Pyuria Afebrile, but several temps around 99 treating with abx for UTI, follow culture Blood cultures NGx2  Metastatic Endometrial Cancer Cancer Related Pain  Appreciate oncology consult  CT chest/abdomen/pelvis for persistent (worsening?) back pain (note centrally necrotic infiltrative mass in L psoas in 06/2024) CT 12/7 with  R psoas muscle mass like enlargement measuring 5.0 x 4.6 cm  indeterminate for tumor vs hematoma (recommended correlation with clinical status and possible contrast enhanced mri or short interval follow imaging  - Hb is stable, consider repeat imaging soon) - L psoas/paraspinal mass decreased in size.   Appreciate oncology needs Will continue to adjust pain meds  Elevated LFT's Monitor   Dysphagia Cortrak + tube feeds  Depression Anxiety Prozac  discontinued with rigidity  Anemia S/p 1 unit pRBC 12/1 S/p IV iron   Normal folate, b12.  Normal iron , ferritin.   Pulmonary Embolism Splenic Infarct Therapeutic lovenox  (see possible hematoma above, low threshold to hold anticoagulation if drop in H/H or sudden worsening abdominal pain)  CAD  Hx Stroke On therapeutic lovenox  above  Hypernatremia Resolved  Abnormal Thyroid  Function Tests Repeat TSH and free T4 wnl Negative TSI On propanolol   T2DM Lantus , SSI - adjust as needed with completion of high dose steroids (12/9-11)  Pressure Ulcer Wound 07/25/24 1100 Pressure Injury Sacrum Medial Deep Tissue Pressure Injury - Purple or maroon localized area of discolored intact skin or blood-filled blister due to damage of underlying soft tissue from pressure and/or shear. (Active)     Wound 08/11/24 0415 Pressure Injury Vertebral column Medial Stage 2 -  Partial thickness loss of dermis presenting as Kamalani Mastro shallow open injury with Arliene Rosenow red, pink wound bed without slough. (Active)   Obesity Body mass index is 34.02 kg/m.     DVT prophylaxis: lovenox  Code Status: DNR Family Communication: older daughter 12/10 at bedside - called husband 12/14, straight to voicemail  Disposition:   Status is: Inpatient Remains inpatient appropriate because: need for continued inpatient care   Consultants:  Neurology Palliative care oncology  Procedures:  11/21 LP  EEG   Antimicrobials:  Anti-infectives (From admission, onward)    Start     Dose/Rate Route Frequency Ordered Stop   08/16/24 1100   cefTRIAXone  (ROCEPHIN ) 2 g in sodium chloride  0.9 % 100 mL IVPB        2 g 200 mL/hr over 30 Minutes Intravenous Every 24 hours 08/16/24 1001     07/23/24 0600  vancomycin  (VANCOCIN ) IVPB 1000 mg/200 mL premix  Status:  Discontinued        1,000 mg 200 mL/hr over 60 Minutes Intravenous Every 12 hours 07/22/24 1834 07/24/24 1808   07/22/24 2200  meropenem  (MERREM ) 2 g in sodium chloride  0.9 % 100 mL IVPB  Status:  Discontinued        2 g 280 mL/hr over 30 Minutes Intravenous Every 8 hours 07/22/24 1601 07/24/24 1808   07/22/24 1700  vancomycin  (VANCOREADY) IVPB 1500 mg/300 mL        1,500 mg 150 mL/hr over 120 Minutes Intravenous  Once 07/22/24 1601 07/22/24 2031   07/22/24 1645  ampicillin  (OMNIPEN) 2 g in sodium chloride  0.9 % 100 mL IVPB  Status:  Discontinued        2 g 300 mL/hr over 20 Minutes Intravenous Every 6 hours 07/22/24 1557 07/24/24 1800   07/19/24 0530  meropenem  (MERREM ) 1 g in sodium chloride  0.9 % 100 mL IVPB  Status:  Discontinued        1 g 200 mL/hr over 30 Minutes Intravenous Every 8 hours 07/19/24 0438 07/22/24 1601   07/16/24 0900  ceFEPIme  (MAXIPIME ) 2 g in sodium chloride  0.9 % 100 mL IVPB  Status:  Discontinued        2 g 200 mL/hr over 30 Minutes Intravenous Every  8 hours 07/16/24 0811 07/19/24 0409   07/16/24 0815  ceFEPIme  (MAXIPIME ) 2 g in sodium chloride  0.9 % 100 mL IVPB  Status:  Discontinued        2 g 200 mL/hr over 30 Minutes Intravenous  Once 07/16/24 0803 07/16/24 0811   07/16/24 0330  cefTRIAXone  (ROCEPHIN ) 2 g in sodium chloride  0.9 % 100 mL IVPB        2 g 200 mL/hr over 30 Minutes Intravenous  Once 07/16/24 0315 07/16/24 0412       Subjective:  Lavanda (daughter) at bedside Nonverbal again today  Objective: Vitals:   08/16/24 2340 08/17/24 0352 08/17/24 0812 08/17/24 1008  BP: (!) 145/71 (!) 155/73 (!) 156/75 (!) 156/75  Pulse: 79 87 97 97  Resp: 16 17 18    Temp: 98.1 F (36.7 C) 97.8 F (36.6 C) 99 F (37.2 C)   TempSrc:  Axillary Axillary Oral   SpO2: 100% 100% 100%   Weight:      Height:        Intake/Output Summary (Last 24 hours) at 08/17/2024 1019 Last data filed at 08/17/2024 0800 Gross per 24 hour  Intake 3254.5 ml  Output 825 ml  Net 2429.5 ml   Filed Weights   08/10/24 0500 08/11/24 0500 08/12/24 0345  Weight: 83.5 kg 84.4 kg 84.4 kg    Examination:  General: No acute distress. Cardiovascular: RRR Lungs: unlabored Abdomen: Soft, nontender, nondistended  Neurological: eyes open, but doesn't follow commands or meaningfully respond.  Moans with sternal rub.  Persistent rigidity (maybe slightly better than yesterday) Extremities: No clubbing or cyanosis. No edema.  Data Reviewed: I have personally reviewed following labs and imaging studies  CBC: Recent Labs  Lab 08/11/24 0540 08/12/24 0300 08/13/24 0525 08/15/24 0545 08/16/24 0830 08/17/24 0702  WBC 8.2 7.9 7.5 8.5 12.4* 9.5  NEUTROABS 6.0 7.6 6.6  --  9.8* 7.5  HGB 9.6* 10.6* 10.0* 10.8* 11.0* 10.4*  HCT 30.0* 33.2* 31.2* 33.0* 34.0* 32.1*  MCV 89.8 89.7 91.0 91.7 89.7 91.7  PLT 277 290 321 352 371 287    Basic Metabolic Panel: Recent Labs  Lab 08/11/24 0540 08/12/24 0300 08/13/24 0525 08/15/24 0545 08/16/24 0830 08/17/24 0702  NA 137 134* 135 139 138 136  K 4.3 4.2 4.1 4.2 4.0 4.1  CL 101 100 100 104 103 100  CO2 25 28 29 28 28 28   GLUCOSE 103* 278* 227* 208* 120* 266*  BUN 16 17 31* 24* 28* 23  CREATININE 0.66 0.58 0.46 0.49 0.40* 0.47  CALCIUM  8.5* 8.5* 8.7* 8.7* 8.9 8.2*  MG 1.9 1.9 2.1  --   --  1.9  PHOS 3.8 3.3 3.0  --   --  3.3    GFR: Estimated Creatinine Clearance: 74.4 mL/min (by C-G formula based on SCr of 0.47 mg/dL).  Liver Function Tests: Recent Labs  Lab 08/12/24 0300 08/13/24 0525 08/15/24 0545 08/16/24 0830 08/17/24 0702  AST 77* 33 50* 43* 45*  ALT 186* 123* 62* 98* 118*  ALKPHOS 255* 213* 167* 163* 143*  BILITOT 0.7 0.6 0.5 0.6 0.6  PROT 6.9 6.3* 6.1* 6.1* 5.6*  ALBUMIN 2.5*  2.4* 2.6* 2.6* 2.3*    CBG: Recent Labs  Lab 08/16/24 1614 08/16/24 2102 08/17/24 0032 08/17/24 0359 08/17/24 0715  GLUCAP 157* 208* 215* 183* 251*     Recent Results (from the past 240 hours)  Culture, Urine (Do not remove urinary catheter, catheter placed by urology or difficult to place)  Status: Abnormal   Collection Time: 08/15/24 10:34 AM   Specimen: Urine, Catheterized  Result Value Ref Range Status   Specimen Description URINE, CATHETERIZED  Final   Special Requests   Final    NONE Performed at [facility], [address] [place], [place] [address]    Culture >=100,000 COLONIES/mL KLEBSIELLA PNEUMONIAE (Ameia Morency)  Final   Report Status 08/17/2024 FINAL  Final   Organism ID, Bacteria KLEBSIELLA PNEUMONIAE (Jaycelyn Orrison)  Final      Susceptibility   Klebsiella pneumoniae - MIC*    AMPICILLIN  >=32 RESISTANT Resistant     CEFAZOLIN  (URINE) Value in next row Resistant      RESISTANTThis is Mame Twombly modified FDA-approved test that has been validated and its performance characteristics determined by the reporting laboratory.  This laboratory is certified under the Clinical Laboratory Improvement Amendments CLIA as qualified to perform high complexity clinical laboratory testing.    CEFEPIME  Value in next row Sensitive      RESISTANTThis is Alizandra Loh modified FDA-approved test that has been validated and its performance characteristics determined by the reporting laboratory.  This laboratory is certified under the Clinical Laboratory Improvement Amendments CLIA as qualified to perform high complexity clinical laboratory testing.    ERTAPENEM Value in next row Sensitive      RESISTANTThis is Kahliyah Dick modified FDA-approved test that has been validated and its performance characteristics determined by the reporting laboratory.  This laboratory is certified under the Clinical Laboratory Improvement Amendments CLIA as qualified to perform high complexity clinical laboratory testing.    CEFTRIAXONE  Value in next  row Sensitive      RESISTANTThis is Cindy Fullman modified FDA-approved test that has been validated and its performance characteristics determined by the reporting laboratory.  This laboratory is certified under the Clinical Laboratory Improvement Amendments CLIA as qualified to perform high complexity clinical laboratory testing.    CIPROFLOXACIN  Value in next row Sensitive      RESISTANTThis is Allisyn Kunz modified FDA-approved test that has been validated and its performance characteristics determined by the reporting laboratory.  This laboratory is certified under the Clinical Laboratory Improvement Amendments CLIA as qualified to perform high complexity clinical laboratory testing.    GENTAMICIN Value in next row Sensitive      RESISTANTThis is Anali Cabanilla modified FDA-approved test that has been validated and its performance characteristics determined by the reporting laboratory.  This laboratory is certified under the Clinical Laboratory Improvement Amendments CLIA as qualified to perform high complexity clinical laboratory testing.    NITROFURANTOIN Value in next row Intermediate      RESISTANTThis is Trajan Grove modified FDA-approved test that has been validated and its performance characteristics determined by the reporting laboratory.  This laboratory is certified under the Clinical Laboratory Improvement Amendments CLIA as qualified to perform high complexity clinical laboratory testing.    TRIMETH /SULFA  Value in next row Sensitive      RESISTANTThis is Jafari Mckillop modified FDA-approved test that has been validated and its performance characteristics determined by the reporting laboratory.  This laboratory is certified under the Clinical Laboratory Improvement Amendments CLIA as qualified to perform high complexity clinical laboratory testing.    AMPICILLIN /SULBACTAM Value in next row Resistant      RESISTANTThis is Kelii Chittum modified FDA-approved test that has been validated and its performance characteristics determined by the reporting laboratory.   This laboratory is certified under the Clinical Laboratory Improvement Amendments CLIA as qualified to perform high complexity clinical laboratory testing.    PIP/TAZO Value in next row Sensitive  16 SENSITIVEThis is Lera Gaines modified FDA-approved test that has been validated and its performance characteristics determined by the reporting laboratory.  This laboratory is certified under the Clinical Laboratory Improvement Amendments CLIA as qualified to perform high complexity clinical laboratory testing.    MEROPENEM  Value in next row Sensitive      16 SENSITIVEThis is Etna Forquer modified FDA-approved test that has been validated and its performance characteristics determined by the reporting laboratory.  This laboratory is certified under the Clinical Laboratory Improvement Amendments CLIA as qualified to perform high complexity clinical laboratory testing.    * >=100,000 COLONIES/mL KLEBSIELLA PNEUMONIAE  Culture, blood (Routine X 2) w Reflex to ID Panel     Status: None (Preliminary result)   Collection Time: 08/15/24 12:41 PM   Specimen: BLOOD RIGHT HAND  Result Value Ref Range Status   Specimen Description BLOOD RIGHT HAND  Final   Special Requests   Final    BOTTLES DRAWN AEROBIC AND ANAEROBIC Blood Culture adequate volume   Culture   Final    NO GROWTH 2 DAYS Performed at [facility], [address] [place], [place] [address]    Report Status PENDING  Incomplete  Culture, blood (Routine X 2) w Reflex to ID Panel     Status: None (Preliminary result)   Collection Time: 08/15/24 12:41 PM   Specimen: BLOOD LEFT HAND  Result Value Ref Range Status   Specimen Description BLOOD LEFT HAND  Final   Special Requests   Final    BOTTLES DRAWN AEROBIC AND ANAEROBIC Blood Culture adequate volume   Culture   Final    NO GROWTH 2 DAYS Performed at [facility], [address] [place], [place] [address]    Report Status PENDING  Incomplete         Radiology Studies: DG Chest Port 1V  same Day Result Date: 08/15/2024 CLINICAL DATA:  [id number] Fever [id number] EXAM: PORTABLE CHEST 1 VIEW COMPARISON:  July 16, 2024, July 12, 2024 FINDINGS: The cardiomediastinal silhouette is unchanged in contour.Low lung volume radiograph. Elevation of the RIGHT hemidiaphragm. RIGHT chest port with tip terminating over the superior cavoatrial junction. Enteric tube tip terminates over the proximal duodenum. No pleural effusion. No pneumothorax. RIGHT midlung bandlike opacity. IMPRESSION: RIGHT midlung bandlike opacity, likely atelectasis. Electronically Signed   By: Corean Salter M.D.   On: 08/15/2024 16:01         Scheduled Meds:  amLODipine   10 mg Per Tube Daily   atorvastatin   80 mg Per Tube QHS   Chlorhexidine  Gluconate Cloth  6 each Topical Daily   enoxaparin  (LOVENOX ) injection  1 mg/kg Subcutaneous Q12H   feeding supplement  237 mL Oral Q24H   feeding supplement (PROSource TF20)  60 mL Per Tube Daily   free water   75 mL Per Tube Q4H   insulin  aspart  0-15 Units Subcutaneous Q4H   insulin  glargine  18 Units Subcutaneous BID   levETIRAcetam   500 mg Per Tube BID   lidocaine   1 patch Transdermal Daily   LORazepam   1 mg Intravenous Once   propranolol   20 mg Per Tube TID   sodium chloride  flush  10-40 mL Intracatheter Q12H   Continuous Infusions:  cefTRIAXone  (ROCEPHIN )  IV Stopped (08/16/24 1144)   feeding supplement (JEVITY 1.5 CAL/FIBER) 45 mL/hr at 08/16/24 1832     LOS: 31 days    Time spent: over 30 min     Meliton Monte, MD [facility]   To contact  the attending provider between 7A-7P or the covering provider during after hours 7P-7A, please log into the web site [email or web address] and access using universal [place] password for that web site. If you do not have the password, please call the hospital operator.  08/17/2024, 10:19 AM

## 2024-08-17 NOTE — Progress Notes (Signed)
 NEUROLOGY CONSULT FOLLOW UP NOTE   Date of service: August 17, 2024 Patient Name: Brenda [name] MRN:  [id number] DOB:  Nov 11, 1960  Interval Hx/subjective  Has low amplitude tremor in face and bilateral UEs, doe snot follow commands. Sister says she said a few words(e.g. ok) yesterday.  Vitals   Vitals:   08/16/24 1915 08/16/24 2340 08/17/24 0352 08/17/24 0812  BP: (!) 144/82 (!) 145/71 (!) 155/73 (!) 156/75  Pulse: 96 79 87 97  Resp: 16 16 17 18   Temp: 98.2 F (36.8 C) 98.1 F (36.7 C) 97.8 F (36.6 C) 99 F (37.2 C)  TempSrc: Axillary Axillary Axillary Oral  SpO2: 100% 100% 100% 100%  Weight:      Height:         Body mass index is 34.02 kg/m.  Physical Exam    Neuro: Mental Status: Patient is awake, eyes are open, but does not follow commands. Cranial Nerves: II: Visual Fields are full. Pupils are unequal, left is larger and less reactive, documented repeatedly in previous notes as chronic.  III,IV, VI: eyes are midline, but her head is turned to the right.  Motor: Tone is increased throughout with waxy flexibility. She has low amplitude trremor x 4 as well as in face.  Sensory: She responds to nox stim x 4.     Medications  Current Facility-Administered Medications:    acetaminophen  (TYLENOL ) 160 MG/5ML solution 500 mg, 500 mg, Per Tube, Q6H PRN, Brenda Sabas RAMAN, [name], 500 mg at 08/15/24 9061   amLODipine  (NORVASC ) tablet 10 mg, 10 mg, Per Tube, Daily, Brenda, Sabas RAMAN, [name], 10 mg at 08/16/24 0934   artificial tears (LACRILUBE) ophthalmic ointment, , Both Eyes, Q4H PRN, Brenda [name], Given at 08/11/24 2102   atorvastatin  (LIPITOR ) tablet 80 mg, 80 mg, Per Tube, QHS, Brenda [name], 80 mg at 08/16/24 2241   cefTRIAXone  (ROCEPHIN ) 2 g in sodium chloride  0.9 % 100 mL IVPB, 2 g, Intravenous, Q24H, Brenda [name], Stopped at 08/16/24 1144   Chlorhexidine  Gluconate Cloth 2 % PADS 6 each, 6 each, Topical, Daily, Brenda [name], Abigail, Brenda [name], 6 each at  08/16/24 9074   enoxaparin  (LOVENOX ) injection 80 mg, 1 mg/kg, Subcutaneous, Q12H, Brenda [name], 80 mg at 08/16/24 2241   feeding supplement (ENSURE PLUS HIGH PROTEIN) liquid 237 mL, 237 mL, Oral, Q24H, Brenda [name], Brenda [name], [name], 237 mL at 08/12/24 1435   feeding supplement (JEVITY 1.5 CAL/FIBER) liquid 1,000 mL, 1,000 mL, Per Tube, Continuous, Brenda Blackwater, [name], Last Rate: 45 mL/hr at 08/16/24 1832, Infusion Verify at 08/16/24 1832   feeding supplement (PROSource TF20) liquid 60 mL, 60 mL, Per Tube, Daily, Brenda [name], 60 mL at 08/16/24 0938   free water  75 mL, 75 mL, Per Tube, Q4H, Brenda Blackwater, [name], 75 mL at 08/17/24 0406   guaiFENesin  (ROBITUSSIN) 100 MG/5ML liquid 15 mL, 15 mL, Per Tube, Q6H PRN, Brenda Sabas RAMAN, [name], 15 mL at 08/15/24 1433   insulin  aspart (novoLOG ) injection 0-15 Units, 0-15 Units, Subcutaneous, Q4H, Brenda [name], 3 Units at 08/17/24 0500   insulin  glargine (LANTUS ) injection 18 Units, 18 Units, Subcutaneous, BID, Brenda [name], 18 Units at 08/16/24 2240   levETIRAcetam  (KEPPRA ) tablet 500 mg, 500 mg, Per Tube, BID, Brenda [name], Brenda [name], Brenda [name], 500 mg at 08/16/24 2241   lidocaine  (LIDODERM ) 5 % 1 patch, 1 patch, Transdermal, Daily, Brenda [name], Kshitiz, [name], 1 patch at 08/16/24 0925   loperamide  HCl (IMODIUM ) 1 MG/7.5ML  suspension 2 mg, 2 mg, Per Tube, PRN, Brenda [name], Brenda [name], 2 mg at 08/05/24 9178   morphine  (PF) 2 MG/ML injection 2 mg, 2 mg, Intravenous, Q2H PRN, 2 mg at 08/15/24 0057 **OR** morphine  (PF) 2 MG/ML injection 4 mg, 4 mg, Intravenous, Q2H PRN, Brenda [name], 4 mg at 08/12/24 2354   naloxone  (NARCAN ) injection 0.4 mg, 0.4 mg, Intravenous, PRN, Brenda Madison, [name]   ondansetron  (ZOFRAN ) tablet 4 mg, 4 mg, Per Tube, Q6H PRN **OR** ondansetron  (ZOFRAN ) injection 4 mg, 4 mg, Intravenous, Q6H PRN, Brenda Blackwater, [name]   oxyCODONE  (Oxy IR/ROXICODONE ) immediate release tablet 2.5 mg, 2.5 mg, Per Tube, Q3H PRN, Brenda [name], Brenda [name], Brenda [name],  2.5 mg at 08/16/24 1707   prochlorperazine  (COMPAZINE ) tablet 10 mg, 10 mg, Oral, Q6H PRN, Brenda [name], Brenda [name], Brenda [name]   propranolol  (INDERAL ) tablet 20 mg, 20 mg, Per Tube, TID, Brenda [name], 20 mg at 08/16/24 2241   senna-docusate (Senokot-[name]) tablet 1 tablet, 1 tablet, Oral, QHS PRN, Brenda [name], Brenda [name], Brenda [name]   sodium chloride  flush (NS) 0.9 % injection 10-40 mL, 10-40 mL, Intracatheter, Q12H, Brenda [name], 10 mL at 08/16/24 2243   sodium chloride  flush (NS) 0.9 % injection 10-40 mL, 10-40 mL, Intracatheter, PRN, Brenda Pick, [name]  Labs and Diagnostic Imaging   CBC:  Recent Labs  Lab 08/16/24 0830 08/17/24 0702  WBC 12.4* 9.5  NEUTROABS 9.8* 7.5  HGB 11.0* 10.4*  HCT 34.0* 32.1*  MCV 89.7 91.7  PLT 371 287    Basic Metabolic Panel:  Lab Results  Component Value Date   NA 136 08/17/2024   K 4.1 08/17/2024   CO2 28 08/17/2024   GLUCOSE 266 (H) 08/17/2024   BUN 23 08/17/2024   CREATININE 0.47 08/17/2024   CALCIUM  8.2 ([name]) 08/17/2024   GFRNONAA >60 08/17/2024   GFRAA 94 12/16/2019   Lipid Panel:  Lab Results  Component Value Date   LDLCALC 28 05/30/2024   HgbA1c:  Lab Results  Component Value Date   HGBA1C 7.2 (H) 06/01/2024   Urine Drug Screen:     Component Value Date/Time   LABOPIA POSITIVE (A) 04/20/2024 1817   COCAINSCRNUR NONE DETECTED 04/20/2024 1817   LABBENZ NONE DETECTED 04/20/2024 1817   AMPHETMU NONE DETECTED 04/20/2024 1817   THCU NONE DETECTED 04/20/2024 1817   LABBARB NONE DETECTED 04/20/2024 1817    Alcohol Level     Component Value Date/Time   ETH <15 04/20/2024 1344   INR  Lab Results  Component Value Date   INR 1.0 07/16/2024   APTT  Lab Results  Component Value Date   APTT 67 (H) 06/21/2024   Imaging personally reviewed MRI Brain(Personally reviewed): 1. Similar evolving infarcts in the anterior and posterior circulation. 2. No new infarcts, progressive mass effect or acute hemorrhage.  Assessment   Brenda [name] is a  63 y.o. 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  Cervical spine imaging also unremarkable for acute process.  At this time, stiffness on her exam coupled with altered mental status is of unclear etiology. Possibilities include side effect/neurotoxicity from the PD-1 inhibitor pembrolizumab , which is being used to treat her cancer, other paraneoplastic syndrome including possibly GAD65 related stiff  person syndrome. Her exam with waxy flexibility and decreased but intermittent verbalization could be consistent with catatonia, but the time course would be unsual.  Steroids have been tried with no improvement and PLEX has been discussed in case this is an irAE associated with keytruda , or some other autoimmune process.   Her CSF results do not favor acute inflammatory demyelinating  polyneuropathy/Guillain-Barr type of pathology.    The resting tremor has been evaluated with EEG, which has been negative and also trial of Sinemet  which also did not impact it.   Recommendations  3 days of steroids completed-no change in clinical status Though my suspicion is low, [name] think the risk of an ativan  challenge is also low and she is awake, [name] will cautiously dose with 1mg  and repeat if she is tolerating it. If she responds robustly, then would consider this rather than proceeding with plasmapheresis.  If no response, [name] would favor trialing plex given her severe symptoms and possible benefit.  Will send serum paraneoplastic panel, awaiting GAD65 ab Neurology will follow.   ______________________________________________________________________  Aisha Seals, [name] [facility]   If 7pm- 7am, please page neurology on call as listed in AMION.

## 2024-08-17 NOTE — Progress Notes (Signed)
 I discussed plasma exchange with the patient's husband, who now agrees to proceed.  I have ordered a pheresis catheter placement to be done by IR, will order Plex tomorrow.  Aisha Seals, MD [facility]   If 7pm- 7am, please page neurology on call as listed in AMION.

## 2024-08-17 NOTE — Plan of Care (Signed)
 Ativan  challenge with MD today. Sleeping most of the day. Family at bedside.    Problem: Education: Goal: Ability to describe self-care measures that may prevent or decrease complications (Diabetes Survival Skills Education) will improve Outcome: Progressing   Problem: Coping: Goal: Ability to adjust to condition or change in health will improve Outcome: Progressing   Problem: Skin Integrity: Goal: Risk for impaired skin integrity will decrease Outcome: Progressing   Problem: Nutrition: Goal: Adequate nutrition will be maintained Outcome: Progressing   Problem: Safety: Goal: Ability to remain free from injury will improve Outcome: Progressing

## 2024-08-18 ENCOUNTER — Inpatient Hospital Stay (HOSPITAL_COMMUNITY)

## 2024-08-18 DIAGNOSIS — Z515 Encounter for palliative care: Secondary | ICD-10-CM | POA: Diagnosis not present

## 2024-08-18 DIAGNOSIS — Z66 Do not resuscitate: Secondary | ICD-10-CM | POA: Diagnosis not present

## 2024-08-18 DIAGNOSIS — T45AX4A Poisoning by immune checkpoint inhibitors and immunostimulant drugs, undetermined, initial encounter: Secondary | ICD-10-CM | POA: Diagnosis not present

## 2024-08-18 DIAGNOSIS — T45AX5A Adverse effect of immune checkpoint inhibitors and immunostimulant drugs, initial encounter: Secondary | ICD-10-CM | POA: Diagnosis not present

## 2024-08-18 DIAGNOSIS — R4182 Altered mental status, unspecified: Secondary | ICD-10-CM | POA: Diagnosis not present

## 2024-08-18 DIAGNOSIS — Z7189 Other specified counseling: Secondary | ICD-10-CM | POA: Diagnosis not present

## 2024-08-18 HISTORY — PX: IR TUNNELED CENTRAL VENOUS CATH PLC W IMG: IMG1939

## 2024-08-18 LAB — GLUCOSE, CAPILLARY
Glucose-Capillary: 123 mg/dL — ABNORMAL HIGH (ref 70–99)
Glucose-Capillary: 145 mg/dL — ABNORMAL HIGH (ref 70–99)
Glucose-Capillary: 189 mg/dL — ABNORMAL HIGH (ref 70–99)
Glucose-Capillary: 190 mg/dL — ABNORMAL HIGH (ref 70–99)
Glucose-Capillary: 216 mg/dL — ABNORMAL HIGH (ref 70–99)
Glucose-Capillary: 255 mg/dL — ABNORMAL HIGH (ref 70–99)
Glucose-Capillary: 54 mg/dL — ABNORMAL LOW (ref 70–99)
Glucose-Capillary: 85 mg/dL (ref 70–99)
Glucose-Capillary: 95 mg/dL (ref 70–99)
Glucose-Capillary: 96 mg/dL (ref 70–99)

## 2024-08-18 LAB — CBC WITH DIFFERENTIAL/PLATELET
Abs Immature Granulocytes: 0.03 K/uL (ref 0.00–0.07)
Basophils Absolute: 0 K/uL (ref 0.0–0.1)
Basophils Relative: 0 %
Eosinophils Absolute: 0.3 K/uL (ref 0.0–0.5)
Eosinophils Relative: 3 %
HCT: 31.7 % — ABNORMAL LOW (ref 36.0–46.0)
Hemoglobin: 10.2 g/dL — ABNORMAL LOW (ref 12.0–15.0)
Immature Granulocytes: 0 %
Lymphocytes Relative: 13 %
Lymphs Abs: 1.2 K/uL (ref 0.7–4.0)
MCH: 29.9 pg (ref 26.0–34.0)
MCHC: 32.2 g/dL (ref 30.0–36.0)
MCV: 93 fL (ref 80.0–100.0)
Monocytes Absolute: 0.6 K/uL (ref 0.1–1.0)
Monocytes Relative: 6 %
Neutro Abs: 7.5 K/uL (ref 1.7–7.7)
Neutrophils Relative %: 78 %
Platelets: 260 K/uL (ref 150–400)
RBC: 3.41 MIL/uL — ABNORMAL LOW (ref 3.87–5.11)
RDW: 19.9 % — ABNORMAL HIGH (ref 11.5–15.5)
WBC: 9.5 K/uL (ref 4.0–10.5)
nRBC: 0 % (ref 0.0–0.2)

## 2024-08-18 LAB — PROTIME-INR
INR: 1 (ref 0.8–1.2)
Prothrombin Time: 14.3 s (ref 11.4–15.2)

## 2024-08-18 LAB — COMPREHENSIVE METABOLIC PANEL WITH GFR
ALT: 102 U/L — ABNORMAL HIGH (ref 0–44)
AST: 38 U/L (ref 15–41)
Albumin: 2.4 g/dL — ABNORMAL LOW (ref 3.5–5.0)
Alkaline Phosphatase: 127 U/L — ABNORMAL HIGH (ref 38–126)
Anion gap: 4 — ABNORMAL LOW (ref 5–15)
BUN: 19 mg/dL (ref 8–23)
CO2: 30 mmol/L (ref 22–32)
Calcium: 8.2 mg/dL — ABNORMAL LOW (ref 8.9–10.3)
Chloride: 105 mmol/L (ref 98–111)
Creatinine, Ser: 0.37 mg/dL — ABNORMAL LOW (ref 0.44–1.00)
GFR, Estimated: >60 mL/min (ref >60)
Glucose, Bld: 183 mg/dL — ABNORMAL HIGH (ref 70–99)
Potassium: 4.1 mmol/L (ref 3.5–5.1)
Sodium: 139 mmol/L (ref 135–145)
Total Bilirubin: 0.6 mg/dL (ref 0.0–1.2)
Total Protein: 5.7 g/dL — ABNORMAL LOW (ref 6.5–8.1)

## 2024-08-18 LAB — HEMOGLOBIN AND HEMATOCRIT, BLOOD
HCT: 30 % — ABNORMAL LOW (ref 36.0–46.0)
HCT: 32.1 % — ABNORMAL LOW (ref 36.0–46.0)
Hemoglobin: 9.6 g/dL — ABNORMAL LOW (ref 12.0–15.0)
Hemoglobin: 10.5 g/dL — ABNORMAL LOW (ref 12.0–15.0)

## 2024-08-18 LAB — MAGNESIUM: Magnesium: 2 mg/dL (ref 1.7–2.4)

## 2024-08-18 LAB — TYPE AND SCREEN
ABO/RH(D): O POS
Antibody Screen: NEGATIVE

## 2024-08-18 LAB — PHOSPHORUS: Phosphorus: 3.4 mg/dL (ref 2.5–4.6)

## 2024-08-18 MED ORDER — LIDOCAINE-EPINEPHRINE 1 %-1:100000 IJ SOLN
20.0000 mL | Freq: Once | INTRAMUSCULAR | Status: AC
  Administered 2024-08-18: 09:00:00 10 mL
  Filled 2024-08-18: qty 20

## 2024-08-18 MED ORDER — GLUCERNA 1.5 CAL PO LIQD
1000.0000 mL | ORAL | Status: DC
  Administered 2024-08-19 – 2024-08-26 (×7): 1000 mL
  Filled 2024-08-18 (×11): qty 1000

## 2024-08-18 MED ORDER — DEXTROSE IN LACTATED RINGERS 5 % IV SOLN: INTRAVENOUS | Status: AC

## 2024-08-18 MED ORDER — IOHEXOL 350 MG/ML SOLN
75.0000 mL | Freq: Once | INTRAVENOUS | Status: AC | PRN
  Administered 2024-08-18: 14:00:00 75 mL via INTRAVENOUS

## 2024-08-18 MED ORDER — HEPARIN SODIUM (PORCINE) 1000 UNIT/ML IJ SOLN
10.0000 mL | Freq: Once | INTRAMUSCULAR | Status: DC
  Filled 2024-08-18 (×2): qty 10

## 2024-08-18 MED ORDER — INSULIN GLARGINE 100 UNIT/ML ~~LOC~~ SOLN
12.0000 [IU] | Freq: Two times a day (BID) | SUBCUTANEOUS | Status: DC
  Filled 2024-08-18: qty 0.12

## 2024-08-18 MED ORDER — INSULIN GLARGINE 100 UNIT/ML ~~LOC~~ SOLN
10.0000 [IU] | Freq: Two times a day (BID) | SUBCUTANEOUS | Status: DC
  Filled 2024-08-18: qty 0.1

## 2024-08-18 MED ORDER — DEXTROSE 50 % IV SOLN
INTRAVENOUS | Status: AC
  Administered 2024-08-18: 18:00:00 50 mL
  Filled 2024-08-18: qty 50

## 2024-08-18 MED ORDER — LIDOCAINE-EPINEPHRINE 1 %-1:100000 IJ SOLN
INTRAMUSCULAR | Status: AC
  Filled 2024-08-18: qty 1

## 2024-08-18 MED ORDER — LEVETIRACETAM (KEPPRA) 500 MG/5 ML ADULT IV PUSH
500.0000 mg | Freq: Two times a day (BID) | INTRAVENOUS | Status: DC
  Administered 2024-08-18 – 2024-09-14 (×54): 500 mg via INTRAVENOUS
  Filled 2024-08-18 (×40): qty 5

## 2024-08-18 MED ORDER — ACETAMINOPHEN 10 MG/ML IV SOLN: 500.0000 mg | Freq: Four times a day (QID) | INTRAVENOUS | Status: AC | PRN

## 2024-08-18 MED ORDER — PANTOPRAZOLE SODIUM 40 MG IV SOLR
40.0000 mg | Freq: Two times a day (BID) | INTRAVENOUS | Status: DC
  Administered 2024-08-18 – 2024-09-16 (×59): 40 mg via INTRAVENOUS
  Filled 2024-08-18 (×45): qty 10

## 2024-08-18 MED ORDER — INSULIN ASPART 100 UNIT/ML IJ SOLN
0.0000 [IU] | INTRAMUSCULAR | Status: DC
  Administered 2024-08-19 – 2024-08-20 (×3): 2 [IU] via SUBCUTANEOUS
  Administered 2024-08-20: 1 [IU] via SUBCUTANEOUS
  Administered 2024-08-20 (×2): 2 [IU] via SUBCUTANEOUS
  Administered 2024-08-20 – 2024-08-21 (×3): 3 [IU] via SUBCUTANEOUS
  Administered 2024-08-21: 2 [IU] via SUBCUTANEOUS
  Administered 2024-08-21: 5 [IU] via SUBCUTANEOUS
  Administered 2024-08-21 – 2024-08-22 (×2): 3 [IU] via SUBCUTANEOUS
  Administered 2024-08-22: 5 [IU] via SUBCUTANEOUS
  Administered 2024-08-22: 3 [IU] via SUBCUTANEOUS
  Administered 2024-08-22: 5 [IU] via SUBCUTANEOUS
  Administered 2024-08-22: 7 [IU] via SUBCUTANEOUS
  Administered 2024-08-22: 2 [IU] via SUBCUTANEOUS
  Administered 2024-08-23 (×4): 3 [IU] via SUBCUTANEOUS
  Administered 2024-08-23 (×2): 2 [IU] via SUBCUTANEOUS
  Administered 2024-08-24: 1 [IU] via SUBCUTANEOUS
  Administered 2024-08-24 (×3): 2 [IU] via SUBCUTANEOUS
  Administered 2024-08-24: 1 [IU] via SUBCUTANEOUS
  Administered 2024-08-24 – 2024-08-25 (×7): 2 [IU] via SUBCUTANEOUS
  Administered 2024-08-26 (×2): 1 [IU] via SUBCUTANEOUS
  Administered 2024-08-26: 2 [IU] via SUBCUTANEOUS
  Administered 2024-08-26 (×2): 1 [IU] via SUBCUTANEOUS
  Administered 2024-08-26 – 2024-08-27 (×2): 2 [IU] via SUBCUTANEOUS
  Administered 2024-08-27: 1 [IU] via SUBCUTANEOUS
  Administered 2024-08-27: 2 [IU] via SUBCUTANEOUS
  Administered 2024-08-27: 1 [IU] via SUBCUTANEOUS
  Administered 2024-08-27: 2 [IU] via SUBCUTANEOUS
  Administered 2024-08-27: 1 [IU] via SUBCUTANEOUS
  Administered 2024-08-28 (×3): 2 [IU] via SUBCUTANEOUS
  Administered 2024-08-28: 1 [IU] via SUBCUTANEOUS
  Administered 2024-08-28: 2 [IU] via SUBCUTANEOUS
  Administered 2024-08-28 – 2024-08-29 (×2): 1 [IU] via SUBCUTANEOUS
  Administered 2024-08-29 (×2): 2 [IU] via SUBCUTANEOUS
  Administered 2024-08-29 (×2): 1 [IU] via SUBCUTANEOUS
  Administered 2024-08-29 – 2024-08-30 (×5): 2 [IU] via SUBCUTANEOUS
  Administered 2024-08-31 (×4): 1 [IU] via SUBCUTANEOUS
  Administered 2024-08-31 (×2): 2 [IU] via SUBCUTANEOUS
  Administered 2024-08-31: 1 [IU] via SUBCUTANEOUS
  Administered 2024-09-01: 2 [IU] via SUBCUTANEOUS
  Administered 2024-09-01: 1 [IU] via SUBCUTANEOUS
  Administered 2024-09-01: 2 [IU] via SUBCUTANEOUS
  Administered 2024-09-01 – 2024-09-02 (×3): 1 [IU] via SUBCUTANEOUS
  Administered 2024-09-02: 2 [IU] via SUBCUTANEOUS
  Administered 2024-09-02: 1 [IU] via SUBCUTANEOUS
  Administered 2024-09-02 – 2024-09-03 (×7): 2 [IU] via SUBCUTANEOUS
  Administered 2024-09-04: 3 [IU] via SUBCUTANEOUS
  Administered 2024-09-04: 2 [IU] via SUBCUTANEOUS
  Administered 2024-09-04: 5 [IU] via SUBCUTANEOUS
  Administered 2024-09-04: 3 [IU] via SUBCUTANEOUS
  Administered 2024-09-04: 5 [IU] via SUBCUTANEOUS
  Administered 2024-09-05: 1 [IU] via SUBCUTANEOUS
  Administered 2024-09-05: 3 [IU] via SUBCUTANEOUS
  Administered 2024-09-05: 1 [IU] via SUBCUTANEOUS
  Administered 2024-09-05 – 2024-09-06 (×3): 2 [IU] via SUBCUTANEOUS
  Administered 2024-09-06: 3 [IU] via SUBCUTANEOUS
  Administered 2024-09-06: 5 [IU] via SUBCUTANEOUS
  Administered 2024-09-06 – 2024-09-07 (×2): 2 [IU] via SUBCUTANEOUS
  Administered 2024-09-07 – 2024-09-08 (×2): 1 [IU] via SUBCUTANEOUS
  Administered 2024-09-09 (×2): 2 [IU] via SUBCUTANEOUS
  Administered 2024-09-09: 3 [IU] via SUBCUTANEOUS
  Administered 2024-09-09: 5 [IU] via SUBCUTANEOUS
  Administered 2024-09-09: 2 [IU] via SUBCUTANEOUS
  Administered 2024-09-10: 5 [IU] via SUBCUTANEOUS
  Administered 2024-09-10 – 2024-09-11 (×4): 2 [IU] via SUBCUTANEOUS
  Administered 2024-09-11: 3 [IU] via SUBCUTANEOUS
  Administered 2024-09-11: 2 [IU] via SUBCUTANEOUS
  Administered 2024-09-11 (×2): 1 [IU] via SUBCUTANEOUS
  Administered 2024-09-11: 2 [IU] via SUBCUTANEOUS
  Administered 2024-09-11 – 2024-09-12 (×2): 1 [IU] via SUBCUTANEOUS
  Administered 2024-09-12: 5 [IU] via SUBCUTANEOUS
  Administered 2024-09-12: 3 [IU] via SUBCUTANEOUS
  Administered 2024-09-12 (×2): 2 [IU] via SUBCUTANEOUS
  Administered 2024-09-13: 3 [IU] via SUBCUTANEOUS
  Administered 2024-09-13: 2 [IU] via SUBCUTANEOUS
  Administered 2024-09-13 – 2024-09-14 (×3): 5 [IU] via SUBCUTANEOUS
  Administered 2024-09-14: 7 [IU] via SUBCUTANEOUS
  Administered 2024-09-14: 5 [IU] via SUBCUTANEOUS
  Administered 2024-09-14: 9 [IU] via SUBCUTANEOUS
  Administered 2024-09-14: 3 [IU] via SUBCUTANEOUS
  Administered 2024-09-14: 9 [IU] via SUBCUTANEOUS
  Administered 2024-09-15: 2 [IU] via SUBCUTANEOUS
  Administered 2024-09-15: 3 [IU] via SUBCUTANEOUS
  Administered 2024-09-15: 1 [IU] via SUBCUTANEOUS
  Filled 2024-08-18: qty 1
  Filled 2024-08-18: qty 2
  Filled 2024-08-18 (×2): qty 3
  Filled 2024-08-18: qty 2
  Filled 2024-08-18: qty 3
  Filled 2024-08-18: qty 2
  Filled 2024-08-18: qty 1
  Filled 2024-08-18 (×4): qty 2
  Filled 2024-08-18: qty 6
  Filled 2024-08-18: qty 5
  Filled 2024-08-18: qty 2
  Filled 2024-08-18: qty 9
  Filled 2024-08-18: qty 2
  Filled 2024-08-18: qty 3
  Filled 2024-08-18: qty 5
  Filled 2024-08-18 (×2): qty 2
  Filled 2024-08-18: qty 1
  Filled 2024-08-18: qty 2
  Filled 2024-08-18: qty 1
  Filled 2024-08-18 (×6): qty 2
  Filled 2024-08-18: qty 5
  Filled 2024-08-18 (×2): qty 2
  Filled 2024-08-18: qty 3
  Filled 2024-08-18: qty 2
  Filled 2024-08-18 (×2): qty 3
  Filled 2024-08-18: qty 2
  Filled 2024-08-18: qty 5
  Filled 2024-08-18: qty 1
  Filled 2024-08-18: qty 2
  Filled 2024-08-18: qty 6
  Filled 2024-08-18: qty 2
  Filled 2024-08-18: qty 5
  Filled 2024-08-18: qty 7
  Filled 2024-08-18: qty 3
  Filled 2024-08-18 (×2): qty 2
  Filled 2024-08-18: qty 3
  Filled 2024-08-18 (×2): qty 2
  Filled 2024-08-18: qty 3
  Filled 2024-08-18: qty 2
  Filled 2024-08-18: qty 1
  Filled 2024-08-18: qty 2
  Filled 2024-08-18 (×2): qty 3
  Filled 2024-08-18: qty 2
  Filled 2024-08-18: qty 3
  Filled 2024-08-18: qty 5
  Filled 2024-08-18: qty 1
  Filled 2024-08-18: qty 9
  Filled 2024-08-18 (×2): qty 3
  Filled 2024-08-18: qty 5
  Filled 2024-08-18: qty 1
  Filled 2024-08-18 (×2): qty 2
  Filled 2024-08-18: qty 3
  Filled 2024-08-18 (×2): qty 2
  Filled 2024-08-18: qty 5
  Filled 2024-08-18: qty 6
  Filled 2024-08-18: qty 3
  Filled 2024-08-18 (×2): qty 2
  Filled 2024-08-18: qty 1
  Filled 2024-08-18: qty 3
  Filled 2024-08-18: qty 1
  Filled 2024-08-18 (×2): qty 2
  Filled 2024-08-18: qty 7
  Filled 2024-08-18 (×4): qty 2
  Filled 2024-08-18 (×2): qty 3
  Filled 2024-08-18: qty 1
  Filled 2024-08-18: qty 2
  Filled 2024-08-18 (×2): qty 1
  Filled 2024-08-18: qty 5
  Filled 2024-08-18: qty 3
  Filled 2024-08-18: qty 1
  Filled 2024-08-18: qty 4

## 2024-08-18 MED ORDER — DEXTROSE 50 % IV SOLN
25.0000 g | INTRAVENOUS | Status: AC
  Administered 2024-08-18: 18:00:00 25 g via INTRAVENOUS

## 2024-08-18 MED ORDER — HEPARIN SODIUM (PORCINE) 1000 UNIT/ML IJ SOLN
INTRAMUSCULAR | Status: AC
  Filled 2024-08-18: qty 10

## 2024-08-18 MED ORDER — PROCHLORPERAZINE EDISYLATE 10 MG/2ML IJ SOLN
10.0000 mg | Freq: Four times a day (QID) | INTRAMUSCULAR | Status: AC | PRN
  Administered 2024-08-30 – 2024-09-14 (×3): 10 mg via INTRAVENOUS
  Filled 2024-08-18 (×2): qty 2

## 2024-08-18 NOTE — Progress Notes (Signed)
 [facility] night cross cover note:   I was notified by the patient's RN that the patient is complaining of cough, in the absence of any shortness of breath, and is requesting as needed cough suppressant. He is NPO but is receiving meds via his PEG tube.   Unable to give prn Tessalon Perles via the PEG tube. Subsequently, I ordered prn dextromethorphan for his cough. Pursued the lower dose of dextromethorphan at 15 mg per tube twice daily as needed for cough given his age.    Update: RN conveys that the patient is slightly agitated and confused, attempting to get out of bed independently and pulling out his leads and lines.  Patient's daughter conveys that, as an outpatient, the patient typically takes Seroquel  in the evening and also notes that he takes melatonin as an outpatient for sleep. I subsequently ordered Seroquel  25 mg per tube nightly, with first dose now and also ordered as needed melatonin per tube for insomnia.     Camelia Cavalier, DO Hospitalist

## 2024-08-18 NOTE — Progress Notes (Signed)
 NEUROLOGY CONSULT FOLLOW UP NOTE   Date of service: August 18, 2024 Patient Name: Brenda [name] MRN:  [id number] DOB:  May 31, 1961  Interval Hx/subjective  Had some BRBPR this AM  Vitals   Vitals:   08/18/24 0017 08/18/24 0428 08/18/24 0800 08/18/24 1022  BP: 112/65 (!) 141/68 136/72 (!) 146/82  Pulse: 65 84 81 100  Resp: 18 19 18 16   Temp: 98.8 F (37.1 C) 98.9 F (37.2 C) 98.2 F (36.8 C) 98.9 F (37.2 C)  TempSrc: Oral  Oral Oral  SpO2: 99% 100% 100% 100%  Weight:      Height:         Body mass index is 34.02 kg/m.  Physical Exam    Neuro: Mental Status: Patient is awake, eyes are open, she answers simple questions, able to identify my thumb, able to follow some central commands(will not protrude tongue), but not appendicular commands.  Cranial Nerves: II: blinks to threat bilaterally. Pupils are unequal, left is larger and less reactive, documented repeatedly in previous notes as chronic.  III,IV, VI: eyes are midline, but her head is turned to the right.  Motor: Tone is increased throughout. She has low amplitude trremor x 4 as well as in face.  Sensory: She responds to nox stim x 4.     Medications  Current Facility-Administered Medications:    acetaminophen  (TYLENOL ) 160 MG/5ML solution 500 mg, 500 mg, Per Tube, Q6H PRN, Drusilla Sabas RAMAN, MD, 500 mg at 08/15/24 9061   amLODipine  (NORVASC ) tablet 10 mg, 10 mg, Per Tube, Daily, Drusilla, Sabas RAMAN, MD, 10 mg at 08/18/24 1036   artificial tears (LACRILUBE) ophthalmic ointment, , Both Eyes, Q4H PRN, Tariq, Hassan, MD, Given at 08/11/24 2102   atorvastatin  (LIPITOR ) tablet 80 mg, 80 mg, Per Tube, QHS, Sigdel, Santosh, MD, 80 mg at 08/17/24 2152   cefTRIAXone  (ROCEPHIN ) 2 g in sodium chloride  0.9 % 100 mL IVPB, 2 g, Intravenous, Q24H, Perri DELENA Meliton Mickey., MD, Last Rate: 200 mL/hr at 08/18/24 1045, 2 g at 08/18/24 1045   Chlorhexidine  Gluconate Cloth 2 % PADS 6 each, 6 each, Topical, Daily, Chavez, Abigail, NP, 6  each at 08/18/24 1050   feeding supplement (ENSURE PLUS HIGH PROTEIN) liquid 237 mL, 237 mL, Oral, Q24H, Lama, Gagan S, MD, 237 mL at 08/17/24 1723   feeding supplement (JEVITY 1.5 CAL/FIBER) liquid 1,000 mL, 1,000 mL, Per Tube, Continuous, Cosette Blackwater, MD, Last Rate: 45 mL/hr at 08/16/24 1832, Infusion Verify at 08/16/24 1832   feeding supplement (PROSource TF20) liquid 60 mL, 60 mL, Per Tube, Daily, Sigdel, Santosh, MD, 60 mL at 08/18/24 1036   free water  75 mL, 75 mL, Per Tube, Q4H, Cosette Blackwater, MD, 75 mL at 08/18/24 1046   guaiFENesin  (ROBITUSSIN) 100 MG/5ML liquid 15 mL, 15 mL, Per Tube, Q6H PRN, Drusilla Sabas RAMAN, MD, 15 mL at 08/15/24 1433   heparin  sodium (porcine) injection 10,000 Units, 10 mL, Intracatheter, Once, Jenna Cordella DELENA, MD   insulin  aspart (novoLOG ) injection 0-15 Units, 0-15 Units, Subcutaneous, Q4H, Perri DELENA Meliton Mickey., MD, 5 Units at 08/18/24 1038   insulin  aspart (novoLOG ) injection 2 Units, 2 Units, Subcutaneous, Q4H, Perri DELENA Meliton Mickey., MD, 2 Units at 08/18/24 1048   insulin  glargine (LANTUS ) injection 18 Units, 18 Units, Subcutaneous, BID, Perri DELENA Meliton Mickey., MD, 18 Units at 08/18/24 1038   levETIRAcetam  (KEPPRA ) tablet 500 mg, 500 mg, Per Tube, BID, Chen, Lydia D, RPH, 500 mg at 08/18/24 1036   lidocaine  (LIDODERM )  5 % 1 patch, 1 patch, Transdermal, Daily, Alekh, Kshitiz, MD, 1 patch at 08/18/24 1047   loperamide  HCl (IMODIUM ) 1 MG/7.5ML suspension 2 mg, 2 mg, Per Tube, PRN, Merilee Linsey I, RPH, 2 mg at 08/05/24 9178   morphine  (PF) 2 MG/ML injection 2 mg, 2 mg, Intravenous, Q2H PRN, 2 mg at 08/15/24 0057 **OR** morphine  (PF) 2 MG/ML injection 4 mg, 4 mg, Intravenous, Q2H PRN, Perri DELENA Meliton Mickey., MD, 4 mg at 08/12/24 2354   naloxone  (NARCAN ) injection 0.4 mg, 0.4 mg, Intravenous, PRN, Alfornia Madison, MD   ondansetron  (ZOFRAN ) tablet 4 mg, 4 mg, Per Tube, Q6H PRN **OR** ondansetron  (ZOFRAN ) injection 4 mg, 4 mg, Intravenous, Q6H PRN, Cosette Blackwater, MD   oxyCODONE  (Oxy IR/ROXICODONE ) immediate release tablet 2.5 mg, 2.5 mg, Per Tube, Q3H PRN, Cooper, Josseline P, PA-C, 2.5 mg at 08/16/24 1707   prochlorperazine  (COMPAZINE ) tablet 10 mg, 10 mg, Oral, Q6H PRN, Foust, Katy L, NP   propranolol  (INDERAL ) tablet 20 mg, 20 mg, Per Tube, TID, Sigdel, Santosh, MD, 20 mg at 08/18/24 1035   senna-docusate (Senokot-S) tablet 1 tablet, 1 tablet, Oral, QHS PRN, Foust, Katy L, NP   sodium chloride  flush (NS) 0.9 % injection 10-40 mL, 10-40 mL, Intracatheter, Q12H, Sigdel, Santosh, MD, 10 mL at 08/18/24 1047   sodium chloride  flush (NS) 0.9 % injection 10-40 mL, 10-40 mL, Intracatheter, PRN, Mcarthur Pick, MD  Labs and Diagnostic Imaging   CBC:  Recent Labs  Lab 08/17/24 0702 08/18/24 0614  WBC 9.5 9.5  NEUTROABS 7.5 7.5  HGB 10.4* 10.2*  HCT 32.1* 31.7*  MCV 91.7 93.0  PLT 287 260    Basic Metabolic Panel:  Lab Results  Component Value Date   NA 139 08/18/2024   K 4.1 08/18/2024   CO2 30 08/18/2024   GLUCOSE 183 (H) 08/18/2024   BUN 19 08/18/2024   CREATININE 0.37 (L) 08/18/2024   CALCIUM  8.2 (L) 08/18/2024   GFRNONAA >60 08/18/2024   GFRAA 94 12/16/2019   Lipid Panel:  Lab Results  Component Value Date   LDLCALC 28 05/30/2024   HgbA1c:  Lab Results  Component Value Date   HGBA1C 7.2 (H) 06/01/2024   Urine Drug Screen:     Component Value Date/Time   LABOPIA POSITIVE (A) 04/20/2024 1817   COCAINSCRNUR NONE DETECTED 04/20/2024 1817   LABBENZ NONE DETECTED 04/20/2024 1817   AMPHETMU NONE DETECTED 04/20/2024 1817   THCU NONE DETECTED 04/20/2024 1817   LABBARB NONE DETECTED 04/20/2024 1817    Alcohol Level     Component Value Date/Time   ETH <15 04/20/2024 1344   INR  Lab Results  Component Value Date   INR 1.0 07/16/2024   APTT  Lab Results  Component Value Date   APTT 67 (H) 06/21/2024   Imaging personally reviewed MRI Brain(Personally reviewed): 1. Similar evolving infarcts in the anterior and  posterior circulation. 2. No new infarcts, progressive mass effect or acute hemorrhage.  Assessment   Brenda [name] is a 63 y.o. 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  Cervical spine imaging also unremarkable for acute process.  At this time, stiffness on her exam coupled with altered mental status is of unclear etiology. Possibilities  include side effect/neurotoxicity from the PD-1 inhibitor pembrolizumab , which is being used to treat her cancer, other paraneoplastic syndrome including possibly GAD65 related stiff person syndrome. Her exam with waxy flexibility and decreased but intermittent verbalization could be consistent with catatonia, but the time course would be unsual.  Steroids have been tried with no improvement and PLEX has been discussed in case this is an irAE associated with keytruda , or some other antibody drive autoimmune process.   Her CSF results do not favor acute inflammatory demyelinating  polyneuropathy/Guillain-Barr type of pathology, nor does her clinical presentation/exam.   The resting tremor has been evaluated with EEG, which has been negative and also trial of Sinemet  which also did not impact it.  I did consider catatonia though my suspicion is low but she became very sedated with only 1mg  ativan , and I would be hesitant to pursue higher doses.    Recommendations  PLEX once GI bleeding is stable Await serum paraneoplastic panel, awaiting GAD65 ab Neurology will follow.   ______________________________________________________________________  Aisha Seals, MD [facility]   If 7pm- 7am, please page neurology on  call as listed in AMION.

## 2024-08-18 NOTE — Progress Notes (Signed)
 Patients tube feeding has been stopped but free water  still order. Cortrak is clogged. Cortrak team not available after 5pm per Amion schedule.

## 2024-08-18 NOTE — Progress Notes (Signed)
 Date and time results received: 08/18/2024   (use smartphrase .now to insert current time)  Test: cbg Critical Value: 27  Name of Provider Notified: powell  Orders Received? Or Actions Taken?: hypoglycemic protocol

## 2024-08-18 NOTE — Progress Notes (Signed)
 Speech Language Pathology Treatment: Dysphagia  Patient Details Name: Brenda [name] MRN: [id number] DOB: 21-Feb-1961 Today's Date: 08/18/2024 Time: 8666-8656 SLP Time Calculation (min) (ACUTE ONLY): 10 min  Assessment / Plan / Recommendation Clinical Impression  Session focused on education with pt's daughter re: swallow function. ST was reconsulted several weeks ago per family request as she was more alert. Her diet was upgraded to regular with family ordering soft foods (soups, potatoes etc). Daughter and husband have been educated re: safe swallow strategies and have demonstrated understanding during sessions. [name] alertness has been decreasing since last week and family is appropriately only offering pt liquids/foods when she is adequately alert and accepting. At this point education has been completed and further skilled intervention from ST is not warranted. Discussed with daughter, Brenda [name] who in in agreement and ST can be reconsulted if needs arise.    HPI HPI: Brenda [name] is a 63 y.o. year old presented to [facility] with chills and weakness, work up for UTI, finding of PE, transferred to [facility]. BSE 11/14 recommended Dys 3/thin. Over weekend pt had significant decline in status, alertness. MRI 11/15 revealed no acute infarcts, interval evolution of previously identified embolic infarcts. Pt lethargic with no improvements over 3 sessions and discharged requesting re-eval if  improvement. MD/family requested re eval of swallow as some sedation meds have been d/c'd and showing fluctuating periods of alertness. PMH:  metastatic endometrial cancer, and chronic cancer-related pain, CVA, hypertension, hyperlipidemia, diabetes mellitus, depression, anxiety, DVT and PE , OSA, chronic hypoxic respiratory failure.      SLP Plan  Discharge SLP treatment due to (comment)        Swallow Evaluation Recommendations   Recommendations: PO diet PO Diet Recommendation: Regular;Thin liquids (Level 0)  (family ordering soft foods, soups, potatoes, etc) Liquid Administration via: Straw;Spoon;Cup Medication Administration: Via alternative means     Recommendations                     Oral care BID   Frequent or constant Supervision/Assistance Dysphagia, oropharyngeal phase (R13.12)     Discharge SLP treatment due to (comment)     Brenda [name]  08/18/2024, 2:01 PM

## 2024-08-18 NOTE — Progress Notes (Signed)
 [facility] night cross cover note:   Regarding patient's cortrak, which became clogged during dayshift, the patient's RN contacted me with request to review patient's current medication orders to determine if any could be converted to IV route of administration for now.   Following my review of current medication orders, I converted the following medication orders from per tube route of administration to IV route of administration:  - prn acetaminophen  for fever, headache, pain - As needed Compazine  for refractory nausea/vomiting - Scheduled Keppra  500 mg bid.     Eva Pore, DO Hospitalist

## 2024-08-18 NOTE — Plan of Care (Signed)
°  Problem: Education: Goal: Ability to describe self-care measures that may prevent or decrease complications (Diabetes Survival Skills Education) will improve Outcome: Progressing   Problem: Fluid Volume: Goal: Ability to maintain a balanced intake and output will improve Outcome: Progressing   Problem: Health Behavior/Discharge Planning: Goal: Ability to identify and utilize available resources and services will improve Outcome: Progressing Goal: Ability to manage health-related needs will improve Outcome: Progressing   Problem: Metabolic: Goal: Ability to maintain appropriate glucose levels will improve Outcome: Progressing

## 2024-08-18 NOTE — Progress Notes (Signed)
 Daily Progress Note   Date: 08/18/2024   Patient Name: Brenda [name]  DOB: 04-21-1961  MRN: [id number]  Age / Sex: 63 y.o., female  Attending Physician: Perri DELENA Meliton Mickey., * Primary Care Physician: [facility] Admit Date: 07/15/2024 Length of Stay: 32 days  Reason for Follow-up: Establishing goals of care  Past Medical History:  Diagnosis Date   Anemia    Arthritis    back, hands, hips (02/22/2015)   CAD (coronary artery disease)    a. complex LAD/diagonal bifurcation PCI in 2010. b. STEMI 10/2019 s/p PTCA/DES x1 to mLAD overlapping the old stent, residual disease treated medicaly.   Cancer [facility])    uterine   CHF (congestive heart failure) (HCC)    Depression    Diabetes mellitus (HCC)    started when I was pregnant; not sure if it was type 1 or type 2    History of radiation therapy    Endometrium- HDR 01/22/22-03/07/22- Dr. Lynwood Nasuti   Hypercholesterolemia    Hypertension    Morbid obesity (HCC)    Myocardial infarction (HCC)    mild x 3   Neuromuscular disorder (HCC)    neuropathy feet   Sleep apnea    cpap/    Assessment & Plan:   HPI/Patient Profile:   63 y.o. 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   Palliative medicine consulted for goals of care conversation.   SUMMARY OF RECOMMENDATIONS DNR limited Continue efforts to improve mentation Symptom management as below  Symptom Management:  Oxycodone  2.5 mg Q3 PRN  Code Status: DNR - Limited (DNR/DNI)  Prognosis: Unable to determine  Discharge Planning: To Be Determined  Discussed with: Perri MD about request from dietitian to discuss PEG tube with family, but plan for continuing current interventions and awaiting for plasma exchange and response before further discussions.   Subjective:    Subjective: Chart Reviewed. Updates received. Patient Assessed. Created space and opportunity for patient  and family to explore thoughts and feelings regarding current medical situation.  Per progress note by Perri MD 08/18/2024, patient is having BRBPR, tube feeds on hold as well as holding plasma exchange while waiting for resolution of bleeding.   Mentation over the last 4 days have not improved despite attempts of high dose steroids after concern for PD1 toxicity. Rigidity remains unchanged despite Sinemet  with consideration of possible parkinsonian syndrome from chemotherapy.   SLP [place] on 08/18/2024 signing off services as education has been completed and further interventions art not warranted. Family is fully aware of appropriate times to feed.   Today's Discussion: Met with patient at bedside today without any visitors. Patient able to answer yes/no questions such as if she was hungry or in pain/discomfort. Remained somnolent.   Review of Systems  All other systems reviewed and are negative.   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     Comments: Somnolent but responds to voice.   HENT:     Head: Normocephalic and atraumatic.  Cardiovascular:     Rate and Rhythm: Normal rate.     Pulses: Normal pulses.  Pulmonary:     Effort: Pulmonary effort is normal.  Abdominal:     Palpations:  Abdomen is soft.  Skin:    General: Skin is warm.  Neurological:     General: No focal deficit present.    Palliative Assessment/Data: 20%   Existing Vynca/ACP Documentation: None  Thank you for allowing us  to participate in the care of Adi Seales PMT will continue to support holistically.  I personally spent a total of 35 minutes in the care of the patient today including preparing to see the patient, getting/reviewing separately obtained history, performing a medically appropriate exam/evaluation, referring and communicating with other health care professionals, and documenting clinical information in the EHR.   [name]  Palliative Medicine Team  Team Phone # [phone] (Nights/Weekends) 08/18/2024 4:53 PM

## 2024-08-18 NOTE — Procedures (Signed)
 Interventional Radiology Procedure:   Indications: Needs a catheter for plasmapheresis  Procedure: Placement of non-tunneled pheresis catheter  Findings: 20 cm triple lumen pheresis catheter placed in right internal jugular vein, tip in right atrium   Complications: None     EBL: Minima  Plan:  Pheresis cathter is ready to use   Caleel Kiner R. Philip, MD  Pager: [phone]

## 2024-08-18 NOTE — Plan of Care (Signed)
°  Problem: Education: Goal: Ability to describe self-care measures that may prevent or decrease complications (Diabetes Survival Skills Education) will improve Outcome: Progressing   Problem: Fluid Volume: Goal: Ability to maintain a balanced intake and output will improve Outcome: Progressing   Problem: Skin Integrity: Goal: Risk for impaired skin integrity will decrease Outcome: Progressing   Problem: Tissue Perfusion: Goal: Adequacy of tissue perfusion will improve Outcome: Progressing   Problem: Education: Goal: Knowledge of General Education information will improve Description: Including pain rating scale, medication(s)/side effects and non-pharmacologic comfort measures Outcome: Progressing   Problem: Clinical Measurements: Goal: Will remain free from infection Outcome: Progressing   Problem: Coping: Goal: Level of anxiety will decrease Outcome: Progressing   Problem: Pain Managment: Goal: General experience of comfort will improve and/or be controlled Outcome: Progressing

## 2024-08-18 NOTE — Progress Notes (Signed)
 Patient has had 3rd occurrence of blood from rectum. Attending was notified with initial occurrence.

## 2024-08-18 NOTE — Progress Notes (Signed)
 Nutrition Follow-up  DOCUMENTATION CODES:  Non-severe (moderate) malnutrition in context of chronic illness  INTERVENTION:  Continue the following tube feeding via cortrak: Glucerna 1.5 at 45 ml/h (1080 ml per day) Free water : 75mL every 4 hours per MD Provides 1620 kcal, 89 gm protein, 820 ml free water  daily ( fee water  TF+flush) Continue current diet as ordered, encourage PO intake Monitor GOC discussions with PMT Ensure Plus High Protein po 1x/d, each supplement provides 350 kcal and 20 grams of protein.   NUTRITION DIAGNOSIS:  Moderate Malnutrition related to chronic illness (cancer) as evidenced by percent weight loss, mild muscle depletion, moderate muscle depletion (12% x 6 months). - remains applicable  GOAL:  Patient will meet greater than or equal to 90% of their needs - being addressed with tube feeds  MONITOR:  Weight trends, TF tolerance, I & O's, Labs, Diet advancement  REASON FOR ASSESSMENT:  Consult Enteral/tube feeding initiation and management  ASSESSMENT:  Pt with hx of HTN, HLD, CAD, CHF, hx CVA, uterine cancer s/p hysterectomy and adjuvant radiation therapy in 2023 with recurrance with mets in July 2025, hx MI x 3, and DM type 1 presented to [facility] ED with weakness after a recent admission from 11/9-11/11. Pt had seizures during admission and was transferred to [facility] for continuous EEG.  11/13 - presented to [facility] with chills and weakness after recent admission to [facility] 11/15 - transferred to [facility] 11/19 - cortrak placed (gastric) 12/2 - SLP BSE, NPO 12/4 - MBS, DYS1, thin liquids 12/9 - SLP BSE, regular diet to promote PO intake  Pt out of room for CT at the time of assessment. Pt with bloody BM x 3 today. TF currently on hold until cause is evaluated. Pt underwent several days of steroids and CBGs have been very elevated since. Will adjust to lower carb formula to aid in controlling.   Discussed with MD and PMT about  eventually needing to explore longterm nutrition options. Once GI bleed is investigated, Neurology is planning a course of PLEX to see if this helps with mentation. Will hold off on discussion until outcomes are apparent.   Admit weight: 77.9 kg Current weight: 84.4 kg   12% weight loss noted in the last 6 months (since cancer recurrence) which is severe for timeframe  Nutritionally Relevant Medications: Scheduled Meds:  atorvastatin   80 mg Per Tube QHS   Ensure Plus High Protein  237 mL Oral Q24H   PROSource TF20  60 mL Per Tube Daily   free water   75 mL Per Tube Q4H   insulin  aspart  0-15 Units Subcutaneous Q4H   insulin  aspart  2 Units Subcutaneous Q4H   insulin  glargine  18 Units Subcutaneous BID   levETIRAcetam   500 mg Per Tube BID   pantoprazole  IV  40 mg Intravenous Q12H   Continuous Infusions:  cefTRIAXone  (ROCEPHIN )  IV 2 g (08/18/24 1045)   feeding supplement (JEVITY 1.5 CAL/FIBER) Stopped (08/18/24 1303)   PRN Meds: loperamide  HCl, ondansetron , prochlorperazine , senna-docusate  Labs Reviewed: Creatinine 0.37 CBG ranges from 145-310 mg/dL over the last 24 hours HgbA1c 7.2% (06/01/24)  NUTRITION - FOCUSED PHYSICAL EXAM: Flowsheet Row Most Recent Value  Orbital Region No depletion  Upper Arm Region No depletion  Thoracic and Lumbar Region No depletion  Buccal Region No depletion  Temple Region Mild depletion  Clavicle Bone Region Mild depletion  Clavicle and Acromion Bone Region Mild depletion  Scapular Bone Region Mild depletion  Dorsal  Hand No depletion  Patellar Region Moderate depletion  Anterior Thigh Region Moderate depletion  Posterior Calf Region Moderate depletion  Edema (RD Assessment) Moderate  [BLE, BUE]  Hair Reviewed  Eyes Reviewed  Mouth Reviewed  Skin Reviewed  Nails Reviewed    Diet Order:   Diet Order             Diet regular Room service appropriate? Yes with Assist; Fluid consistency: Thin  Diet effective now                    EDUCATION NEEDS:  Education needs have been addressed  Skin:  Skin Assessment: Reviewed RN Assessment DTI: - Sacrum (0.3x0.3x0.1 cm) Stage 2:  - medial vertebral column  Last BM:  12/16 - type 5  Height:  Ht Readings from Last 1 Encounters:  07/16/24 5' 2 (1.575 m)    Weight:  Wt Readings from Last 1 Encounters:  08/12/24 84.4 kg    Ideal Body Weight:  50 kg  BMI:  Body mass index is 34.02 kg/m.  Estimated Nutritional Needs:  Kcal:  1600-1800 kcal/d Protein:  75-90 g/d Fluid:  >/=1.6L/d    Vernell Lukes, RD, LDN, CNSC Registered Dietitian II Please reach out via secure chat

## 2024-08-18 NOTE — Progress Notes (Addendum)
 PROGRESS NOTE    Brenda [name]  FMW:[id number] DOB: 1961/06/28 DOA: 07/15/2024 PCP: [facility]  No chief complaint on file.   Brief Narrative:   63 year old female with history of metastatic endometrial cancer, chronic cancer-related pain, hypertension, hyperlipidemia, diabetes mellitus, CVA, history of DVT and PE anticoagulated on Lovenox , OSA, chronic hypoxic respiratory failure on 2L via nasal cannula and recent hospitalization and discharged on 07/14/2024 for UTI treated with IV antibiotics followed by oral antibiotics on discharge presented with chills and weakness.  She was found to have UTI and was started on IV cefepime .  Unfortunately mental status worsened and she was transferred to [facility] for continuous EEG monitoring with 4 seizures noted on video EEG. She was on Keppra  11/16. Placed on empiric broad-spectrum antibiotics for possible meningitis/encephalitis which was ruled out based on CSF results. .    Neurology has been reconsulted 12/7 with development of rigidity.  Oncology has been reconsulted.  Family has begun to discuss palliative care, but at this point managing her cancer related pain and working on rigidity with neurology.  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 Brenda [name] and her husband beginning to talk about palliative care on the day I met them (12/7).  I had Ayoub Arey conversation with them  about comfort focused care and hospice, but at this point, we're trying to work up and treat the acute issues below (with regards to her rigidity and encephalopathy).  Currently allowing time for outcomes with neurology's evaluation and treatment of her acute issues below.  Further decisions will be pending her response to treatment.    Bright Red Blood Per Rectum Suspect lower GI bleed Holding anticoagulation  Repeat H/H, type and screen Vitals currently stable  IV PPI BID GI consulted CT GI bleed study  Rigidity  Seizures Note 11/15 she had stiffening of upper extremities and was transferred to [facility] for Continuous EEG. 4 seizures noted 11/16 S/p LP, meningitis ruled out Her husband notes worsening stiffness/rigidity/inability to move extremities starting about 1 week ago  EEG without epileptiform abnormalities head CT without acute intracranial abnormality MRI brain with evolving infarcts - MRI C spine without evidence of metastatic disease Continue keppra   appreciate neurology recommendations - ddx includes PD-1 inhibitor induced side effect, paraneoplatic syndrome, stiff person syndrome.  Considering catatonia.  We've stopped prozac  (low dose, lower suspicion for serotonin syndrome?).  S/p high dose steroids 12/9-11.  S/p trial sinimet.  S/p baclofen .  Plan is for PLEX, but she's bleeding - on hold.  Follow paraneoplastic panel, GAD65 ab.  Acute Metabolic Encephalopathy Sounds like suspicion that this and seizures above related to cefepime  Delirium precautions  Seems worse today, related to above Caution with pain meds, will d/c scheduled nightly oxy - issues with somnolence on fentanyl , will balance need for opiates with sedation  Klebsiella Pneumoniae UTI Leukocytosis Pyuria Afebrile, but several temps around 99 treating with abx for UTI -  plan for 5 day course Blood cultures NGx2  Metastatic Endometrial Cancer Cancer Related Pain  Appreciate oncology consult  CT  chest/abdomen/pelvis for persistent (worsening?) back pain (note centrally necrotic infiltrative mass in L psoas in 06/2024) CT 12/7 with R psoas muscle mass like enlargement measuring 5.0 x 4.6 cm indeterminate for tumor vs hematoma (recommended correlation with clinical status and possible contrast enhanced mri or short interval follow imaging) - L psoas/paraspinal mass decreased in size.   Appreciate oncology needs Will continue to adjust pain meds  Elevated LFT's Monitor - relatively mild, will trend for now  Dysphagia Cortrak + tube feeds  Depression Anxiety Prozac  discontinued with rigidity  Anemia S/p 1 unit pRBC 12/1 S/p IV iron   Normal folate, b12.  Normal iron , ferritin.   Pulmonary Embolism Splenic Infarct Lovenox  on hold  CAD  Hx Stroke Lovenox  on hold  Hypernatremia Resolved  Abnormal Thyroid  Function Tests Repeat TSH and free T4 wnl Negative TSI On propanolol   T2DM Lantus , SSI - adjust as needed with completion of high dose steroids (12/9-11)  Pressure Ulcer Wound 07/25/24 1100 Pressure Injury Sacrum Medial Deep Tissue Pressure Injury - Purple or maroon localized area of discolored intact skin or blood-filled blister due to damage of underlying soft tissue from pressure and/or shear. (Active)     Wound 08/11/24 0415 Pressure Injury Vertebral column Medial Stage 2 -  Partial thickness loss of dermis presenting as Alizia Greif shallow open injury with Pat Sires red, pink wound bed without slough. (Active)   Obesity Body mass index is 34.02 kg/m.     DVT prophylaxis: lovenox  on hold with bleeding  Code Status: DNR Family Communication: Brenda [name], Mr. Usery 12/16 Disposition:   Status is: Inpatient Remains inpatient appropriate because: need for continued inpatient care   Consultants:  Neurology Palliative care oncology  Procedures:  11/21 LP  EEG   Antimicrobials:  Anti-infectives (From admission, onward)    Start     Dose/Rate Route Frequency  Ordered Stop   08/16/24 1100  cefTRIAXone  (ROCEPHIN ) 2 g in sodium chloride  0.9 % 100 mL IVPB        2 g 200 mL/hr over 30 Minutes Intravenous Every 24 hours 08/16/24 1001     07/23/24 0600  vancomycin  (VANCOCIN ) IVPB 1000 mg/200 mL premix  Status:  Discontinued        1,000 mg 200 mL/hr over 60 Minutes Intravenous Every 12 hours 07/22/24 1834 07/24/24 1808   07/22/24 2200  meropenem  (MERREM ) 2 g in sodium chloride  0.9 % 100 mL IVPB  Status:  Discontinued        2 g 280 mL/hr over 30 Minutes Intravenous Every 8 hours 07/22/24 1601 07/24/24 1808   07/22/24 1700  vancomycin  (VANCOREADY) IVPB 1500 mg/300 mL        1,500 mg 150 mL/hr over 120 Minutes Intravenous  Once 07/22/24 1601 07/22/24 2031   07/22/24 1645  ampicillin  (OMNIPEN) 2 g in sodium chloride  0.9 % 100 mL IVPB  Status:  Discontinued        2 g 300 mL/hr over 20 Minutes Intravenous Every 6 hours 07/22/24 1557 07/24/24 1800   07/19/24 0530  meropenem  (MERREM ) 1 g in sodium chloride  0.9 % 100 mL IVPB  Status:  Discontinued        1 g 200 mL/hr over 30 Minutes Intravenous Every 8 hours 07/19/24 0438 07/22/24 1601   07/16/24 0900  ceFEPIme  (MAXIPIME ) 2 g in sodium chloride  0.9 % 100 mL IVPB  Status:  Discontinued  2 g 200 mL/hr over 30 Minutes Intravenous Every 8 hours 07/16/24 0811 07/19/24 0409   07/16/24 0815  ceFEPIme  (MAXIPIME ) 2 g in sodium chloride  0.9 % 100 mL IVPB  Status:  Discontinued        2 g 200 mL/hr over 30 Minutes Intravenous  Once 07/16/24 0803 07/16/24 0811   07/16/24 0330  cefTRIAXone  (ROCEPHIN ) 2 g in sodium chloride  0.9 % 100 mL IVPB        2 g 200 mL/hr over 30 Minutes Intravenous  Once 07/16/24 0315 07/16/24 0412       Subjective:  Daughter Brenda [name] at bedside She assents that she has abdominal pain and that she's hurting (limited responses, yes and no answers)  Objective: Vitals:   08/18/24 0017 08/18/24 0428 08/18/24 0800 08/18/24 1022  BP: 112/65 (!) 141/68 136/72 (!) 146/82  Pulse: 65  84 81 100  Resp: 18 19 18 16   Temp: 98.8 F (37.1 C) 98.9 F (37.2 C) 98.2 F (36.8 C) 98.9 F (37.2 C)  TempSrc: Oral  Oral Oral  SpO2: 99% 100% 100% 100%  Weight:      Height:        Intake/Output Summary (Last 24 hours) at 08/18/2024 1200 Last data filed at 08/18/2024 1155 Gross per 24 hour  Intake 460 ml  Output --  Net 460 ml   Filed Weights   08/10/24 0500 08/11/24 0500 08/12/24 0345  Weight: 83.5 kg 84.4 kg 84.4 kg    Examination:  General: No acute distress. Cardiovascular: RRR Lungs: unlabored Abdomen: Soft, nontender, nondistended Neurological: answers simple questions with 1 word answers today, remains rigid Extremities: no LEE   Data Reviewed: I have personally reviewed following labs and imaging studies  CBC: Recent Labs  Lab 08/12/24 0300 08/13/24 0525 08/15/24 0545 08/16/24 0830 08/17/24 0702 08/18/24 0614  WBC 7.9 7.5 8.5 12.4* 9.5 9.5  NEUTROABS 7.6 6.6  --  9.8* 7.5 7.5  HGB 10.6* 10.0* 10.8* 11.0* 10.4* 10.2*  HCT 33.2* 31.2* 33.0* 34.0* 32.1* 31.7*  MCV 89.7 91.0 91.7 89.7 91.7 93.0  PLT 290 321 352 371 287 260    Basic Metabolic Panel: Recent Labs  Lab 08/12/24 0300 08/13/24 0525 08/15/24 0545 08/16/24 0830 08/17/24 0702 08/18/24 0614  NA 134* 135 139 138 136 139  K 4.2 4.1 4.2 4.0 4.1 4.1  CL 100 100 104 103 100 105  CO2 28 29 28 28 28 30   GLUCOSE 278* 227* 208* 120* 266* 183*  BUN 17 31* 24* 28* 23 19  CREATININE 0.58 0.46 0.49 0.40* 0.47 0.37*  CALCIUM  8.5* 8.7* 8.7* 8.9 8.2* 8.2*  MG 1.9 2.1  --   --  1.9 2.0  PHOS 3.3 3.0  --   --  3.3 3.4    GFR: Estimated Creatinine Clearance: 74.4 mL/min (Adrianne Shackleton) (by C-G formula based on SCr of 0.37 mg/dL (L)).  Liver Function Tests: Recent Labs  Lab 08/13/24 0525 08/15/24 0545 08/16/24 0830 08/17/24 0702 08/18/24 0614  AST 33 50* 43* 45* 38  ALT 123* 62* 98* 118* 102*  ALKPHOS 213* 167* 163* 143* 127*  BILITOT 0.6 0.5 0.6 0.6 0.6  PROT 6.3* 6.1* 6.1* 5.6* 5.7*  ALBUMIN  2.4* 2.6* 2.6* 2.3* 2.4*    CBG: Recent Labs  Lab 08/18/24 0015 08/18/24 0429 08/18/24 0802 08/18/24 0952 08/18/24 1154  GLUCAP 145* 189* 190* 216* 255*     Recent Results (from the past 240 hours)  Culture, Urine (Do not remove urinary catheter, catheter  placed by urology or difficult to place)     Status: Abnormal   Collection Time: 08/15/24 10:34 AM   Specimen: Urine, Catheterized  Result Value Ref Range Status   Specimen Description URINE, CATHETERIZED  Final   Special Requests   Final    NONE Performed at [facility], [address], [place], [place] [address]    Culture >=100,000 COLONIES/mL KLEBSIELLA PNEUMONIAE (Cinzia Devos)  Final   Report Status 08/17/2024 FINAL  Final   Organism ID, Bacteria KLEBSIELLA PNEUMONIAE (Lyndall Windt)  Final      Susceptibility   Klebsiella pneumoniae - MIC*    AMPICILLIN  >=32 RESISTANT Resistant     CEFAZOLIN  (URINE) Value in next row Resistant      RESISTANTThis is Delene Morais modified FDA-approved test that has been validated and its performance characteristics determined by the reporting laboratory.  This laboratory is certified under the Clinical Laboratory Improvement Amendments CLIA as qualified to perform high complexity clinical laboratory testing.    CEFEPIME  Value in next row Sensitive      RESISTANTThis is Sinia Antosh modified FDA-approved test that has been validated and its performance characteristics determined by the reporting laboratory.  This laboratory is certified under the Clinical Laboratory Improvement Amendments CLIA as qualified to perform high complexity clinical laboratory testing.    ERTAPENEM Value in next row Sensitive      RESISTANTThis is Temekia Caskey modified FDA-approved test that has been validated and its performance characteristics determined by the reporting laboratory.  This laboratory is certified under the Clinical Laboratory Improvement Amendments CLIA as qualified to perform high complexity clinical laboratory testing.    CEFTRIAXONE  Value in  next row Sensitive      RESISTANTThis is Jakori Burkett modified FDA-approved test that has been validated and its performance characteristics determined by the reporting laboratory.  This laboratory is certified under the Clinical Laboratory Improvement Amendments CLIA as qualified to perform high complexity clinical laboratory testing.    CIPROFLOXACIN  Value in next row Sensitive      RESISTANTThis is Traveion Ruddock modified FDA-approved test that has been validated and its performance characteristics determined by the reporting laboratory.  This laboratory is certified under the Clinical Laboratory Improvement Amendments CLIA as qualified to perform high complexity clinical laboratory testing.    GENTAMICIN Value in next row Sensitive      RESISTANTThis is Sonya Gunnoe modified FDA-approved test that has been validated and its performance characteristics determined by the reporting laboratory.  This laboratory is certified under the Clinical Laboratory Improvement Amendments CLIA as qualified to perform high complexity clinical laboratory testing.    NITROFURANTOIN Value in next row Intermediate      RESISTANTThis is Jaydalee Bardwell modified FDA-approved test that has been validated and its performance characteristics determined by the reporting laboratory.  This laboratory is certified under the Clinical Laboratory Improvement Amendments CLIA as qualified to perform high complexity clinical laboratory testing.    TRIMETH /SULFA  Value in next row Sensitive      RESISTANTThis is Stephanos Fan modified FDA-approved test that has been validated and its performance characteristics determined by the reporting laboratory.  This laboratory is certified under the Clinical Laboratory Improvement Amendments CLIA as qualified to perform high complexity clinical laboratory testing.    AMPICILLIN /SULBACTAM Value in next row Resistant      RESISTANTThis is Isaac Lacson modified FDA-approved test that has been validated and its performance characteristics determined by the reporting  laboratory.  This laboratory is certified under the Clinical Laboratory Improvement Amendments CLIA as qualified to perform high complexity clinical laboratory  testing.    PIP/TAZO Value in next row Sensitive      16 SENSITIVEThis is Jennessa Trigo modified FDA-approved test that has been validated and its performance characteristics determined by the reporting laboratory.  This laboratory is certified under the Clinical Laboratory Improvement Amendments CLIA as qualified to perform high complexity clinical laboratory testing.    MEROPENEM  Value in next row Sensitive      16 SENSITIVEThis is Copper Basnett modified FDA-approved test that has been validated and its performance characteristics determined by the reporting laboratory.  This laboratory is certified under the Clinical Laboratory Improvement Amendments CLIA as qualified to perform high complexity clinical laboratory testing.    * >=100,000 COLONIES/mL KLEBSIELLA PNEUMONIAE  Culture, blood (Routine X 2) w Reflex to ID Panel     Status: None (Preliminary result)   Collection Time: 08/15/24 12:41 PM   Specimen: BLOOD RIGHT HAND  Result Value Ref Range Status   Specimen Description BLOOD RIGHT HAND  Final   Special Requests   Final    BOTTLES DRAWN AEROBIC AND ANAEROBIC Blood Culture adequate volume   Culture   Final    NO GROWTH 2 DAYS Performed at [facility], [address] [place], [place] [address]    Report Status PENDING  Incomplete  Culture, blood (Routine X 2) w Reflex to ID Panel     Status: None (Preliminary result)   Collection Time: 08/15/24 12:41 PM   Specimen: BLOOD LEFT HAND  Result Value Ref Range Status   Specimen Description BLOOD LEFT HAND  Final   Special Requests   Final    BOTTLES DRAWN AEROBIC AND ANAEROBIC Blood Culture adequate volume   Culture   Final    NO GROWTH 2 DAYS Performed at [facility], [address], [place], [place] [address]    Report Status PENDING  Incomplete         Radiology Studies: No  results found.        Scheduled Meds:  amLODipine   10 mg Per Tube Daily   atorvastatin   80 mg Per Tube QHS   Chlorhexidine  Gluconate Cloth  6 each Topical Daily   feeding supplement  237 mL Oral Q24H   feeding supplement (PROSource TF20)  60 mL Per Tube Daily   free water   75 mL Per Tube Q4H   heparin  sodium (porcine)  10 mL Intracatheter Once   insulin  aspart  0-15 Units Subcutaneous Q4H   insulin  aspart  2 Units Subcutaneous Q4H   insulin  glargine  18 Units Subcutaneous BID   levETIRAcetam   500 mg Per Tube BID   lidocaine   1 patch Transdermal Daily   pantoprazole  (PROTONIX ) IV  40 mg Intravenous Q12H   propranolol   20 mg Per Tube TID   sodium chloride  flush  10-40 mL Intracatheter Q12H   Continuous Infusions:  cefTRIAXone  (ROCEPHIN )  IV 2 g (08/18/24 1045)   feeding supplement (JEVITY 1.5 CAL/FIBER) 45 mL/hr at 08/16/24 1832     LOS: 32 days    Time spent: over 30 min  45 min critical care time with acute GI bleeding - [facility]   Meliton Monte, MD [facility]   To contact the attending provider between 7A-7P or the covering provider during after hours 7P-7A, please log into the web site [email or web address] and access using universal [place] password for that web site. If you do not have the password, please call the hospital operator.  08/18/2024, 12:00 PM

## 2024-08-19 DIAGNOSIS — G9341 Metabolic encephalopathy: Secondary | ICD-10-CM | POA: Diagnosis not present

## 2024-08-19 DIAGNOSIS — T45AX5A Adverse effect of immune checkpoint inhibitors and immunostimulant drugs, initial encounter: Secondary | ICD-10-CM | POA: Diagnosis not present

## 2024-08-19 DIAGNOSIS — R4182 Altered mental status, unspecified: Secondary | ICD-10-CM | POA: Diagnosis not present

## 2024-08-19 DIAGNOSIS — T45AX4A Poisoning by immune checkpoint inhibitors and immunostimulant drugs, undetermined, initial encounter: Secondary | ICD-10-CM | POA: Diagnosis not present

## 2024-08-19 LAB — CBC WITH DIFFERENTIAL/PLATELET
Abs Immature Granulocytes: 0.03 K/uL (ref 0.00–0.07)
Basophils Absolute: 0 K/uL (ref 0.0–0.1)
Basophils Relative: 0 %
Eosinophils Absolute: 0.2 K/uL (ref 0.0–0.5)
Eosinophils Relative: 3 %
HCT: 29.5 % — ABNORMAL LOW (ref 36.0–46.0)
Hemoglobin: 9.4 g/dL — ABNORMAL LOW (ref 12.0–15.0)
Immature Granulocytes: 1 %
Lymphocytes Relative: 22 %
Lymphs Abs: 1.4 K/uL (ref 0.7–4.0)
MCH: 29.8 pg (ref 26.0–34.0)
MCHC: 31.9 g/dL (ref 30.0–36.0)
MCV: 93.7 fL (ref 80.0–100.0)
Monocytes Absolute: 0.5 K/uL (ref 0.1–1.0)
Monocytes Relative: 7 %
Neutro Abs: 4.1 K/uL (ref 1.7–7.7)
Neutrophils Relative %: 67 %
Platelets: 224 K/uL (ref 150–400)
RBC: 3.15 MIL/uL — ABNORMAL LOW (ref 3.87–5.11)
RDW: 19.9 % — ABNORMAL HIGH (ref 11.5–15.5)
WBC: 6.2 K/uL (ref 4.0–10.5)
nRBC: 0 % (ref 0.0–0.2)

## 2024-08-19 LAB — COMPREHENSIVE METABOLIC PANEL WITH GFR
ALT: 104 U/L — ABNORMAL HIGH (ref 0–44)
AST: 40 U/L (ref 15–41)
Albumin: 2.8 g/dL — ABNORMAL LOW (ref 3.5–5.0)
Alkaline Phosphatase: 146 U/L — ABNORMAL HIGH (ref 38–126)
Anion gap: 6 (ref 5–15)
BUN: 22 mg/dL (ref 8–23)
CO2: 28 mmol/L (ref 22–32)
Calcium: 8.6 mg/dL — ABNORMAL LOW (ref 8.9–10.3)
Chloride: 106 mmol/L (ref 98–111)
Creatinine, Ser: 0.35 mg/dL — ABNORMAL LOW (ref 0.44–1.00)
GFR, Estimated: >60 mL/min (ref >60)
Glucose, Bld: 94 mg/dL (ref 70–99)
Potassium: 4.2 mmol/L (ref 3.5–5.1)
Sodium: 140 mmol/L (ref 135–145)
Total Bilirubin: 0.4 mg/dL (ref 0.0–1.2)
Total Protein: 5.4 g/dL — ABNORMAL LOW (ref 6.5–8.1)

## 2024-08-19 LAB — HEMOGLOBIN AND HEMATOCRIT, BLOOD
HCT: 28.9 % — ABNORMAL LOW (ref 36.0–46.0)
Hemoglobin: 9.3 g/dL — ABNORMAL LOW (ref 12.0–15.0)

## 2024-08-19 LAB — GLUCOSE, CAPILLARY
Glucose-Capillary: 89 mg/dL (ref 70–99)
Glucose-Capillary: 103 mg/dL — ABNORMAL HIGH (ref 70–99)
Glucose-Capillary: 153 mg/dL — ABNORMAL HIGH (ref 70–99)
Glucose-Capillary: 78 mg/dL (ref 70–99)
Glucose-Capillary: 97 mg/dL (ref 70–99)
Glucose-Capillary: 126 mg/dL — ABNORMAL HIGH (ref 70–99)

## 2024-08-19 LAB — MAGNESIUM: Magnesium: 2.1 mg/dL (ref 1.7–2.4)

## 2024-08-19 LAB — PHOSPHORUS: Phosphorus: 4.2 mg/dL (ref 2.5–4.6)

## 2024-08-19 MED ORDER — HYDROMORPHONE HCL 1 MG/ML IJ SOLN: 1.0000 mg | INTRAMUSCULAR | Status: DC | PRN

## 2024-08-19 MED ORDER — HYDROMORPHONE HCL 1 MG/ML IJ SOLN
INTRAMUSCULAR | Status: AC
  Administered 2024-08-19: 10:00:00 0.5 mg via INTRAVENOUS
  Filled 2024-08-19: qty 0.5

## 2024-08-19 MED ORDER — OXYCODONE HCL 5 MG PO TABS
2.5000 mg | ORAL_TABLET | Freq: Three times a day (TID) | ORAL | Status: DC
  Administered 2024-08-19 – 2024-08-24 (×15): 2.5 mg
  Filled 2024-08-19 (×15): qty 1

## 2024-08-19 MED ORDER — HYDROMORPHONE HCL 1 MG/ML IJ SOLN
0.5000 mg | INTRAMUSCULAR | Status: DC | PRN
  Administered 2024-08-19 – 2024-08-20 (×5): 0.5 mg via INTRAVENOUS
  Filled 2024-08-19 (×5): qty 0.5

## 2024-08-19 MED ORDER — OXYCODONE HCL 5 MG PO TABS
2.5000 mg | ORAL_TABLET | ORAL | Status: DC | PRN
  Administered 2024-08-20: 11:00:00 2.5 mg via ORAL
  Filled 2024-08-19: qty 1

## 2024-08-19 NOTE — Plan of Care (Signed)
   Problem: Fluid Volume: Goal: Ability to maintain a balanced intake and output will improve Outcome: Progressing   Problem: Health Behavior/Discharge Planning: Goal: Ability to identify and utilize available resources and services will improve Outcome: Progressing   Problem: Metabolic: Goal: Ability to maintain appropriate glucose levels will improve Outcome: Progressing   Problem: Skin Integrity: Goal: Risk for impaired skin integrity will decrease Outcome: Progressing

## 2024-08-19 NOTE — Progress Notes (Signed)
 PROGRESS NOTE    Brenda [name]  FMW:[id number] DOB: [birth year] DOA: 07/15/2024 PCP: [facility]  No chief complaint on file.   Brief Narrative:   63 year old female with history of metastatic endometrial cancer, chronic cancer-related pain, hypertension, hyperlipidemia, diabetes mellitus, CVA, history of DVT and PE anticoagulated on Lovenox , OSA, chronic hypoxic respiratory failure on 2L via nasal cannula and recent hospitalization and discharged on 07/14/2024 for UTI treated with IV antibiotics followed by oral antibiotics on discharge presented with chills and weakness.  She was found to have UTI and was started on IV cefepime .  Unfortunately mental status worsened and she was transferred to [facility] for continuous EEG monitoring with 4 seizures noted on video EEG. She was on Keppra  11/16. Placed on empiric broad-spectrum antibiotics for possible meningitis/encephalitis which was ruled out based on CSF results. .    Neurology has been reconsulted 12/7 with development of rigidity.  Oncology has been reconsulted.  Family has begun to discuss palliative care, but at this point managing her cancer related pain and working on rigidity with neurology.  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 and her husband beginning to talk about palliative care on the day I met them (12/7).  I had Luv Mish conversation with them  about comfort focused care and hospice, but at this point, we're trying to work up and treat the acute issues below (with regards to her rigidity and encephalopathy).  Currently allowing time for outcomes with neurology's evaluation and treatment of her acute issues below.  We've planned for PLEX, but delayed with GI bleed yesterday.  Today [name] more alert, expressing desire for Ronasia Isola focus on comfort.  Will work on pain control, will allow neurology to have conversation with her/family regarding plan for PLEX.  If patient/family decide to defer this, transition to full comfort measures/hospice would be appropriate (I think we're heading this direction).      Bright Red Blood Per Rectum Suspect lower GI bleed Holding anticoagulation  Hb relatively stable today IV PPI BID CT GI bleed study without evidence of GI bleed   Rigidity  Seizures Note 11/15 she had stiffening of upper extremities and was transferred to [facility] for Continuous EEG. 4 seizures noted 11/16 S/p LP, meningitis ruled out Her husband notes worsening stiffness/rigidity/inability to move extremities starting about 1 week ago  EEG without epileptiform abnormalities head CT without acute intracranial abnormality MRI brain with evolving infarcts - MRI C spine without evidence of metastatic disease Continue keppra   appreciate neurology recommendations - ddx includes PD-1 inhibitor induced side effect, paraneoplatic syndrome, stiff person syndrome.  Considering catatonia.  We've stopped prozac  (low dose, lower suspicion for serotonin syndrome?).  S/p high dose steroids 12/9-11.  S/p trial sinimet.  S/p baclofen .  Plan was for PLEX, but not sure she's up for this anymore, maybe transitioning to comfort, will follow neurology discussion with patient/family.  Follow paraneoplastic  panel, GAD65 ab.  Acute Metabolic Encephalopathy Sounds like suspicion that this and seizures above related to cefepime  Delirium precautions  She's been  better able to communicate 12/16 and 12/17 - today being more like when I first met her on 12/7. issues with somnolence on fentanyl , will balance need for opiates with sedation - likely transition to comfort based on my conversation  Klebsiella Pneumoniae UTI Leukocytosis Pyuria Afebrile, but several temps around 99 treating with abx for UTI - plan for 5 day course Blood cultures NGx2  Metastatic Endometrial Cancer Cancer Related Pain  Appreciate oncology consult  CT chest/abdomen/pelvis for persistent (worsening?) back pain (note centrally necrotic infiltrative mass in L psoas in 06/2024) CT 12/7 with R psoas muscle mass like enlargement measuring 5.0 x 4.6 cm indeterminate for tumor vs hematoma  - L psoas/paraspinal mass decreased in size.   CT 12/16 with R psoas muscle hematoma, L2 sclerotic changes with L paraspinal masslike tissue at L2 concerning for neoplastic process Appreciate oncology needs - last saw 12/15 Will continue to adjust pain meds  Elevated LFT's Monitor - relatively mild, will trend for now  Dysphagia Cortrak + tube feeds  Depression Anxiety Prozac  discontinued with rigidity  Anemia S/p 1 unit pRBC 12/1 S/p IV iron   Normal folate, b12.  Normal iron , ferritin.   Pulmonary Embolism Splenic Infarct Lovenox  on hold with GI bleed and psoas hematoma above  CAD  Hx Stroke Lovenox  on hold  Hypernatremia Resolved  Abnormal Thyroid  Function Tests Repeat TSH and free T4 wnl Negative TSI On propanolol   T2DM Lantus , SSI - adjust as needed with completion of high dose steroids (12/9-11)  Pressure Ulcer Wound 07/25/24 1100 Pressure Injury Sacrum Medial Deep Tissue Pressure Injury - Purple or maroon localized area of discolored intact skin or blood-filled blister due to damage of underlying soft tissue from pressure and/or shear. (Active)     Wound 08/11/24 0415 Pressure Injury Vertebral column Medial Stage 2 -  Partial thickness loss of dermis presenting  as Mansfield Dann shallow open injury with Lamont Glasscock red, pink wound bed without slough. (Active)   Obesity Body mass index is 34.02 kg/m.     DVT prophylaxis: lovenox  on hold with bleeding  Code Status: DNR Family Communication: [name] (at bedside), Mr. Hyppolite (over phone) 12/17 Disposition:   Status is: Inpatient Remains inpatient appropriate because: need for continued inpatient care   Consultants:  Neurology Palliative care oncology  Procedures:  11/21 LP  EEG   Antimicrobials:  Anti-infectives (From admission, onward)    Start     Dose/Rate Route Frequency Ordered Stop   08/16/24 1100  cefTRIAXone  (ROCEPHIN ) 2 g in sodium chloride  0.9 % 100 mL IVPB        2 g 200 mL/hr over 30 Minutes Intravenous Every 24 hours 08/16/24 1001 08/21/24 1059   07/23/24 0600  vancomycin  (VANCOCIN ) IVPB 1000 mg/200 mL premix  Status:  Discontinued        1,000 mg 200 mL/hr over 60 Minutes Intravenous Every 12 hours 07/22/24 1834 07/24/24 1808   07/22/24 2200  meropenem  (MERREM ) 2 g in sodium chloride  0.9 % 100 mL IVPB  Status:  Discontinued        2 g 280 mL/hr over 30 Minutes Intravenous Every 8 hours 07/22/24 1601 07/24/24 1808   07/22/24 1700  vancomycin  (VANCOREADY) IVPB 1500 mg/300 mL        1,500 mg 150 mL/hr over 120 Minutes Intravenous  Once 07/22/24 1601 07/22/24 2031   07/22/24 1645  ampicillin  (OMNIPEN) 2 g in sodium chloride  0.9 % 100 mL IVPB  Status:  Discontinued        2 g 300 mL/hr over 20 Minutes Intravenous Every 6 hours 07/22/24 1557 07/24/24 1800   07/19/24 0530  meropenem  (MERREM ) 1 g in sodium chloride  0.9 % 100 mL IVPB  Status:  Discontinued        1 g 200 mL/hr over 30 Minutes Intravenous Every 8 hours 07/19/24 0438 07/22/24 1601   07/16/24 0900  ceFEPIme  (MAXIPIME ) 2 g in sodium chloride  0.9 % 100 mL IVPB  Status:  Discontinued        2 g 200 mL/hr over 30 Minutes Intravenous Every 8 hours 07/16/24 0811 07/19/24 0409   07/16/24 0815  ceFEPIme  (MAXIPIME ) 2 g in sodium  chloride 0.9 % 100 mL IVPB  Status:  Discontinued        2 g 200 mL/hr over 30 Minutes Intravenous  Once 07/16/24 0803 07/16/24 0811   07/16/24 0330  cefTRIAXone  (ROCEPHIN ) 2 g in sodium chloride  0.9 % 100 mL IVPB        2 g 200 mL/hr over 30 Minutes Intravenous  Once 07/16/24 0315 07/16/24 9587       Subjective:  Daughter [name] at bedside She's complaining of pain, can't take it anymore Doesn't seem interested in any more treatments - wants to focus on comfort  Objective: Vitals:   08/18/24 2022 08/18/24 2351 08/19/24 0358 08/19/24 0744  BP: 130/78 112/71 125/71 (!) 144/83  Pulse: 96 72 72 91  Resp: 18 18 18 16   Temp: 99.2 F (37.3 C) 98.9 F (37.2 C) 97.7 F (36.5 C) 98.4 F (36.9 C)  TempSrc: Oral Oral Oral Oral  SpO2: 100% 100% 100% 100%  Weight:      Height:        Intake/Output Summary (Last 24 hours) at 08/19/2024 1020 Last data filed at 08/18/2024 2021 Gross per 24 hour  Intake 289.75 ml  Output --  Net 289.75 ml   Filed Weights   08/10/24 0500 08/11/24 0500 08/12/24 0345  Weight: 83.5 kg 84.4 kg 84.4 kg    Examination:  General: ill appearing, tearful Cardiovascular: RRR Lungs: unlabored Neurological: Alert. Rigid. Extremities: No clubbing or cyanosis. No edema.    Data Reviewed: I have personally reviewed following labs and imaging studies  CBC: Recent Labs  Lab 08/13/24 0525 08/15/24 0545 08/16/24 0830 08/17/24 0702 08/18/24 0614 08/18/24 1223 08/18/24 1850 08/19/24 0001 08/19/24 0705  WBC 7.5 8.5 12.4* 9.5 9.5  --   --   --  6.2  NEUTROABS 6.6  --  9.8* 7.5 7.5  --   --   --  4.1  HGB 10.0* 10.8* 11.0* 10.4* 10.2* 9.6* 10.5* 9.3* 9.4*  HCT 31.2* 33.0* 34.0* 32.1* 31.7* 30.0* 32.1* 28.9* 29.5*  MCV 91.0 91.7 89.7 91.7 93.0  --   --   --  93.7  PLT 321 352 371 287 260  --   --   --  224    Basic Metabolic Panel: Recent Labs  Lab 08/13/24 0525 08/15/24 0545 08/16/24 0830 08/17/24 0702 08/18/24 0614 08/19/24 0705  NA 135  139 138 136 139 140  K 4.1 4.2 4.0 4.1 4.1 4.2  CL 100 104 103 100 105 106  CO2 29 28 28 28 30 28   GLUCOSE 227* 208* 120* 266* 183* 94  BUN 31* 24* 28* 23 19 22   CREATININE 0.46 0.49 0.40* 0.47 0.37* 0.35*  CALCIUM  8.7* 8.7*  8.9 8.2* 8.2* 8.6*  MG 2.1  --   --  1.9 2.0 2.1  PHOS 3.0  --   --  3.3 3.4 4.2    GFR: Estimated Creatinine Clearance: 74.4 mL/min (Cotey Rakes) (by C-G formula based on SCr of 0.35 mg/dL (L)).  Liver Function Tests: Recent Labs  Lab 08/15/24 0545 08/16/24 0830 08/17/24 0702 08/18/24 0614 08/19/24 0705  AST 50* 43* 45* 38 40  ALT 62* 98* 118* 102* 104*  ALKPHOS 167* 163* 143* 127* 146*  BILITOT 0.5 0.6 0.6 0.6 0.4  PROT 6.1* 6.1* 5.6* 5.7* 5.4*  ALBUMIN 2.6* 2.6* 2.3* 2.4* 2.8*    CBG: Recent Labs  Lab 08/18/24 1803 08/18/24 2024 08/18/24 2349 08/19/24 0328 08/19/24 0731  GLUCAP 123* 95 96 89 103*     Recent Results (from the past 240 hours)  Culture, Urine (Do not remove urinary catheter, catheter placed by urology or difficult to place)     Status: Abnormal   Collection Time: 08/15/24 10:34 AM   Specimen: Urine, Catheterized  Result Value Ref Range Status   Specimen Description URINE, CATHETERIZED  Final   Special Requests   Final    NONE Performed at [facility], [address] [place], [place] [address]    Culture >=100,000 COLONIES/mL KLEBSIELLA PNEUMONIAE (Terran Hollenkamp)  Final   Report Status 08/17/2024 FINAL  Final   Organism ID, Bacteria KLEBSIELLA PNEUMONIAE (Shaneisha Burkel)  Final      Susceptibility   Klebsiella pneumoniae - MIC*    AMPICILLIN  >=32 RESISTANT Resistant     CEFAZOLIN  (URINE) Value in next row Resistant      RESISTANTThis is Emilyrose Darrah modified FDA-approved test that has been validated and its performance characteristics determined by the reporting laboratory.  This laboratory is certified under the Clinical Laboratory Improvement Amendments CLIA as qualified to perform high complexity clinical laboratory testing.    CEFEPIME  Value in next row  Sensitive      RESISTANTThis is Karen Huhta modified FDA-approved test that has been validated and its performance characteristics determined by the reporting laboratory.  This laboratory is certified under the Clinical Laboratory Improvement Amendments CLIA as qualified to perform high complexity clinical laboratory testing.    ERTAPENEM Value in next row Sensitive      RESISTANTThis is Jani Ploeger modified FDA-approved test that has been validated and its performance characteristics determined by the reporting laboratory.  This laboratory is certified under the Clinical Laboratory Improvement Amendments CLIA as qualified to perform high complexity clinical laboratory testing.    CEFTRIAXONE  Value in next row Sensitive      RESISTANTThis is Genea Rheaume modified FDA-approved test that has been validated and its performance characteristics determined by the reporting laboratory.  This laboratory is certified under the Clinical Laboratory Improvement Amendments CLIA as qualified to perform high complexity clinical laboratory testing.    CIPROFLOXACIN  Value in next row Sensitive      RESISTANTThis is Loreal Schuessler modified FDA-approved test that has been validated and its performance characteristics determined by the reporting laboratory.  This laboratory is certified under the Clinical Laboratory Improvement Amendments CLIA as qualified to perform high complexity clinical laboratory testing.    GENTAMICIN Value in next row Sensitive      RESISTANTThis is Reigan Tolliver modified FDA-approved test that has been validated and its performance characteristics determined by the reporting laboratory.  This laboratory is certified under the Clinical Laboratory Improvement Amendments CLIA as qualified to perform high complexity clinical laboratory testing.    NITROFURANTOIN Value in next row Intermediate  RESISTANTThis is Lillyanna Glandon modified FDA-approved test that has been validated and its performance characteristics determined by the reporting laboratory.  This laboratory  is certified under the Clinical Laboratory Improvement Amendments CLIA as qualified to perform high complexity clinical laboratory testing.    TRIMETH /SULFA  Value in next row Sensitive      RESISTANTThis is Jessia Kief modified FDA-approved test that has been validated and its performance characteristics determined by the reporting laboratory.  This laboratory is certified under the Clinical Laboratory Improvement Amendments CLIA as qualified to perform high complexity clinical laboratory testing.    AMPICILLIN /SULBACTAM Value in next row Resistant      RESISTANTThis is Auriella Wieand modified FDA-approved test that has been validated and its performance characteristics determined by the reporting laboratory.  This laboratory is certified under the Clinical Laboratory Improvement Amendments CLIA as qualified to perform high complexity clinical laboratory testing.    PIP/TAZO Value in next row Sensitive      16 SENSITIVEThis is Kam Kushnir modified FDA-approved test that has been validated and its performance characteristics determined by the reporting laboratory.  This laboratory is certified under the Clinical Laboratory Improvement Amendments CLIA as qualified to perform high complexity clinical laboratory testing.    MEROPENEM  Value in next row Sensitive      16 SENSITIVEThis is Amariz Flamenco modified FDA-approved test that has been validated and its performance characteristics determined by the reporting laboratory.  This laboratory is certified under the Clinical Laboratory Improvement Amendments CLIA as qualified to perform high complexity clinical laboratory testing.    * >=100,000 COLONIES/mL KLEBSIELLA PNEUMONIAE  Culture, blood (Routine X 2) w Reflex to ID Panel     Status: None (Preliminary result)   Collection Time: 08/15/24 12:41 PM   Specimen: BLOOD RIGHT HAND  Result Value Ref Range Status   Specimen Description BLOOD RIGHT HAND  Final   Special Requests   Final    BOTTLES DRAWN AEROBIC AND ANAEROBIC Blood Culture adequate volume    Culture   Final    NO GROWTH 4 DAYS Performed at [facility], [address], [place], [place] [address]    Report Status PENDING  Incomplete  Culture, blood (Routine X 2) w Reflex to ID Panel     Status: None (Preliminary result)   Collection Time: 08/15/24 12:41 PM   Specimen: BLOOD LEFT HAND  Result Value Ref Range Status   Specimen Description BLOOD LEFT HAND  Final   Special Requests   Final    BOTTLES DRAWN AEROBIC AND ANAEROBIC Blood Culture adequate volume   Culture   Final    NO GROWTH 4 DAYS Performed at [facility], [address] [place], [place] [address]    Report Status PENDING  Incomplete         Radiology Studies: CT ANGIO GI BLEED Result Date: 08/18/2024 CLINICAL DATA:  Bright red blood per rectum. EXAM: CTA ABDOMEN AND PELVIS WITHOUT AND WITH CONTRAST TECHNIQUE: Multidetector CT imaging of the abdomen and pelvis was performed using the standard protocol during bolus administration of intravenous contrast. Multiplanar reconstructed images and MIPs were obtained and reviewed to evaluate the vascular anatomy. RADIATION DOSE REDUCTION: This exam was performed according to the departmental dose-optimization program which includes automated exposure control, adjustment of the mA and/or kV according to patient size and/or use of iterative reconstruction technique. CONTRAST:  75mL OMNIPAQUE  IOHEXOL  350 MG/ML SOLN COMPARISON:  None Available. FINDINGS: VASCULAR Aorta: Normal caliber aorta without aneurysm, dissection, vasculitis or significant stenosis. Celiac: Patent without evidence  of aneurysm, dissection, vasculitis or significant stenosis. SMA: Patent without evidence of aneurysm, dissection, vasculitis or significant stenosis. Renals: Both renal arteries are patent without evidence of aneurysm, dissection, vasculitis, fibromuscular dysplasia or significant stenosis. IMA: Patent without evidence of aneurysm, dissection, vasculitis or significant stenosis.  Inflow: Patent without evidence of aneurysm, dissection, vasculitis or significant stenosis. Proximal Outflow: The visualized proximal of fluids patent. Veins: No obvious venous abnormality within the limitations of this arterial phase study. Review of the MIP images confirms the above findings. NON-VASCULAR Lower chest: The visualized lung bases are clear. Advanced coronary vascular calcification of the LAD. No intra-abdominal free air or free fluid. Hepatobiliary: The liver is unremarkable. There is dilatation, post cholecystectomy. No retained calcified stone noted in the central CBD. Pancreas: Unremarkable. No pancreatic ductal dilatation or surrounding inflammatory changes. Spleen: Normal in size without focal abnormality. Adrenals/Urinary Tract: The adrenal glands are unremarkable. There is no hydronephrosis or nephrolithiasis on either side. There is symmetric enhancement of the kidneys. The visualized ureters appear unremarkable. The urinary bladder is decompressed around Avie Checo Foley catheter. Stomach/Bowel: Feeding tube with tip in the proximal duodenum. There is no bowel obstruction or active inflammation. No evidence of active GI bleed. The appendix is normal. Lymphatic: No adenopathy. Reproductive: Hysterectomy.  No suspicious adnexal masses. Other: None Musculoskeletal: No acute osseous pathology. Degenerative changes of the spine. Syncope L2 sclerotic changes. There is Quinnlan Abruzzo 1.6 x 4.2 cm soft tissue mass to the left the spine at L2 concerning for Kathleene Bergemann neoplastic process. Further evaluation with MRI without and with contrast recommended. There is enlargement of the right psoas muscle with an intramuscular collection measuring 4.7 x 4.2 cm in greatest axial dimension most consistent with an intramuscular hematoma. IMPRESSION: 1. No evidence of active GI bleed. 2. No bowel obstruction. Normal appendix. 3. Right psoas intramuscular hematoma. 4. L2 sclerotic changes with left paraspinal masslike tissue at L2  concerning for Boyd Buffalo neoplastic process. Further evaluation with MRI without and with contrast recommended. Electronically Signed   By: Vanetta Chou M.D.   On: 08/18/2024 16:21   IR NON-TUNNELED CENTRAL VENOUS CATH [facility] W IMG Result Date: 08/18/2024 INDICATION: Catheter needed for plasmapheresis. EXAM: FLUOROSCOPIC AND ULTRASOUND GUIDED PLACEMENT OF Fleeta Kunde NON-TUNNELED DIALYSIS/PHERESIS CATHETER Physician: Juliene SAUNDERS. Henn, MD MEDICATIONS: 1% lidocaine  for local anesthetic ANESTHESIA/SEDATION: None FLUOROSCOPY TIME:  Radiation Exposure Index (as provided by the fluoroscopic device): 4 mGy Kerma COMPLICATIONS: None immediate. PROCEDURE: Informed consent was obtained for catheter placement. The patient was placed supine on the interventional table. Ultrasound confirmed Chynna Buerkle patent right internal jugular vein. Ultrasound images were obtained for documentation. The right neck was prepped and draped in Abdirahman Chittum sterile fashion. Maximal barrier sterile technique was utilized including caps, mask, sterile gowns, sterile gloves, sterile drape, hand hygiene and skin antiseptic. The right neck was anesthetized with 1% lidocaine . Dorsie Sethi small incision was made with #11 blade scalpel. Celina Shiley 21 gauge needle directed into the right internal jugular vein with ultrasound guidance. Makynzie Dobesh micropuncture dilator set was placed. Roderick Sweezy 20 cm Mahurkar catheter was selected. The catheter was advanced over Adaiah Morken wire and positioned in the right atrium. Fluoroscopic images were obtained for documentation. Both dialysis lumens were found to aspirate and flush well. The proper amount of heparin  was flushed in both lumens. The central venous lumen was flushed with normal saline. Catheter was sutured to skin. FINDINGS: Catheter tip in the right atrium. IMPRESSION: Successful placement of Seaton Hofmann right jugular non-tunneled pheresis catheter using ultrasound and fluoroscopic guidance. Electronically Signed   By:  Juliene Balder M.D.   On: 08/18/2024 14:17          Scheduled Meds:   amLODipine   10 mg Per Tube Daily   atorvastatin   80 mg Per Tube QHS   Chlorhexidine  Gluconate Cloth  6 each Topical Daily   feeding supplement  237 mL Oral Q24H   free water   75 mL Per Tube Q4H   insulin  aspart  0-9 Units Subcutaneous Q4H   levETIRAcetam   500 mg Intravenous Q12H   lidocaine   1 patch Transdermal Daily   pantoprazole  (PROTONIX ) IV  40 mg Intravenous Q12H   propranolol   20 mg Per Tube TID   sodium chloride  flush  10-40 mL Intracatheter Q12H   Continuous Infusions:  acetaminophen      cefTRIAXone  (ROCEPHIN )  IV 2 g (08/18/24 1045)   dextrose  5% lactated ringers  50 mL/hr at 08/18/24 1653   feeding supplement (GLUCERNA 1.5 CAL)       LOS: 33 days    Time spent: over 30 min  45 min critical care time with acute GI bleeding - BRBPR   Meliton Monte, MD [facility]   To contact the attending provider between 7A-7P or the covering provider during after hours 7P-7A, please log into the web site [email or web address] and access using universal [place] password for that web site. If you do not have the password, please call the hospital operator.  08/19/2024, 10:20 AM

## 2024-08-19 NOTE — Progress Notes (Signed)
 Occupational Therapy Treatment Patient Details Name: Brenda [name] MRN: [id number] DOB: 1961-02-20 Today's Date: 08/19/2024   History of present illness 63 yo female who returned to hospital on 07/15/2024 with ongoing signs and symptoms of UTI with generalized weakness. Pt with 4 seizures on EEG 11/16. Working diagnosis of cefepime  neurotoxicity. Prior admit 11/9 with urinary symptoms and AMS d/c home on OP ABX. PMH includes but is not limited to: HTN, HLD, DM, depression/anxiety, CVA (CIR recently), DVT/PE, OSA on CPAP, chronic respiratory failure on 2 L/min at baseline, and metastatic endometrial cancer with chronic cancer-related pain.   OT comments  Patient received in supine and seen by skilled OT to address ROM, caregiver education, and appropriateness for BUE RHS.  Patient continues to demonstrate BUE wrist extension and does not appear appropriate for RHSs and would benefit from continued wear of palm guards.  Family educated on doffing palm guards to check for skin breakdown and perform hand hygiene.  Caregiver training performed with daughter for BUE ROM with instructions on thumb ROM due to daughters asking on technique.  Patient will benefit from continued inpatient follow up therapy, <3 hours/day.  Acute OT to continue to follow to address established goals to facilitate DC to next venue of care.        If plan is discharge home, recommend the following:  Two people to help with walking and/or transfers;Two people to help with bathing/dressing/bathroom;Assistance with cooking/housework;Assistance with feeding;Direct supervision/assist for medications management;Direct supervision/assist for financial management;Assist for transportation;Help with stairs or ramp for entrance;Supervision due to cognitive status   Equipment Recommendations  Hospital bed;Hoyer lift    Recommendations for Other Services      Precautions / Restrictions Precautions Precautions: Fall Recall of  Precautions/Restrictions: Impaired Precaution/Restrictions Comments: Cortrak, skin integrity Restrictions Weight Bearing Restrictions Per Provider Order: No       Mobility Bed Mobility                    Transfers                         Balance                                           ADL either performed or assessed with clinical judgement   ADL Overall ADL's : Needs assistance/impaired                                       General ADL Comments: focused on ROM and caregiver training    Extremity/Trunk Assessment              Vision       Perception     Praxis     Communication Communication Communication: Impaired Factors Affecting Communication: Difficulty expressing self   Cognition Arousal: Lethargic Behavior During Therapy: Flat affect Cognition: Cognition impaired             OT - Cognition Comments: patient stating, I can't do this any more and asking daughter for palitive care                 Following commands: Impaired Following commands impaired: Follows one step commands inconsistently      Cueing   Cueing Techniques: Verbal cues, Tactile cues  Exercises Exercises: General Upper Extremity General Exercises - Upper Extremity Shoulder Flexion: Both, Supine, AAROM, 10 reps Shoulder ABduction: AAROM, PROM, Both, 10 reps, Supine Elbow Flexion: PROM, Both, Supine, 5 reps Elbow Extension: PROM, Both, Supine, 5 reps Wrist Flexion: PROM, Both, Supine, 5 reps Wrist Extension: PROM, Both, Supine, 5 reps Digit Composite Flexion: PROM, Both, Supine, 5 reps Composite Extension: PROM, Both, Supine, 5 reps    Shoulder Instructions       General Comments      Pertinent Vitals/ Pain       Pain Assessment Pain Assessment: Faces Faces Pain Scale: Hurts little more Pain Location: BUE with ROM Pain Descriptors / Indicators: Grimacing, Moaning Pain Intervention(s): Limited activity  within patient's tolerance, Monitored during session, Repositioned  Home Living                                          Prior Functioning/Environment              Frequency  Min 1X/week        Progress Toward Goals  OT Goals(current goals can now be found in the care plan section)  Progress towards OT goals: Progressing toward goals     Plan      Co-evaluation                 AM-PAC OT 6 Clicks Daily Activity     Outcome Measure   Help from another person eating meals?: Total Help from another person taking care of personal grooming?: Total Help from another person toileting, which includes using toliet, bedpan, or urinal?: Total Help from another person bathing (including washing, rinsing, drying)?: Total Help from another person to put on and taking off regular upper body clothing?: Total Help from another person to put on and taking off regular lower body clothing?: Total 6 Click Score: 6    End of Session Equipment Utilized During Treatment: Oxygen   OT Visit Diagnosis: Muscle weakness (generalized) (M62.81);Low vision, both eyes (H54.2);Feeding difficulties (R63.3);Cognitive communication deficit (R41.841) Symptoms and signs involving cognitive functions: Cerebral infarction   Activity Tolerance Patient limited by lethargy   Patient Left in bed;with call bell/phone within reach;with bed alarm set;with family/visitor present   Nurse Communication Other (comment) (patient participation and asking for palitive care)        Time: 9152-9092 OT Time Calculation (min): 20 min  Charges: OT General Charges $OT Visit: 1 Visit OT Treatments $Therapeutic Exercise: 8-22 mins  Dick Laine, OTA Acute Rehabilitation Services  Office [phone]   Jeb LITTIE Laine 08/19/2024, 10:36 AM

## 2024-08-19 NOTE — Progress Notes (Signed)
 PT Cancellation Note  Patient Details Name: Brenda [name] MRN: [id number] DOB: May 19, 1961   Cancelled Treatment:    Reason Eval/Treat Not Completed: Other (comment)  Remaining PT goal is for family education re: LE PROM. Per OT, patient was seen by them this morning and stating she wants Palliative/comfort care. Currently there is no family in the room. Church members present and not sure when they will return. Will continue efforts at family education as appropriate.    Macario RAMAN, PT Acute Rehabilitation Services  Office [phone]  Macario SHAUNNA Soja 08/19/2024, 11:24 AM

## 2024-08-19 NOTE — Progress Notes (Addendum)
 NEUROLOGY CONSULT FOLLOW UP NOTE   Date of service: August 19, 2024 Patient Name: Brenda [name] MRN:  [id number] DOB:  1960/12/29  Interval Hx/subjective   She is more awake today, able to answer questions a little more fluently  Vitals   Vitals:   08/19/24 0744 08/19/24 1132 08/19/24 1215 08/19/24 1520  BP: (!) 144/83  (!) 115/54 132/60  Pulse: 91  74 72  Resp: 16  16 16   Temp: 98.4 F (36.9 C)  98.2 F (36.8 C) 99.4 F (37.4 C)  TempSrc: Oral Oral Oral Oral  SpO2: 100% 100% 100% 100%  Weight:      Height:         Body mass index is 34.02 kg/m.  Physical Exam    Neuro: Mental Status: Patient is awake, eyes are open, she answers simple questions, able to name simple objects, able to follow commands Cranial Nerves: II: blinks to threat bilaterally. Pupils are unequal, left is larger and less reactive, documented repeatedly in previous notes as chronic.  III,IV, VI: eyes are midline, but her head is turned to the right.  Motor: Tone is increased throughout. She has low amplitude trremor x 4 as well as in face.  Sensory: She responds to nox stim x 4.     Medications  Current Facility-Administered Medications:    acetaminophen  (OFIRMEV ) IV 500 mg, 500 mg, Intravenous, Q6H PRN, Howerter, Justin B, DO   amLODipine  (NORVASC ) tablet 10 mg, 10 mg, Per Tube, Daily, Drusilla, Sabas RAMAN, MD, 10 mg at 08/18/24 1036   artificial tears (LACRILUBE) ophthalmic ointment, , Both Eyes, Q4H PRN, Tariq, Hassan, MD, Given at 08/11/24 2102   atorvastatin  (LIPITOR ) tablet 80 mg, 80 mg, Per Tube, QHS, Sigdel, Santosh, MD, 80 mg at 08/17/24 2152   cefTRIAXone  (ROCEPHIN ) 2 g in sodium chloride  0.9 % 100 mL IVPB, 2 g, Intravenous, Q24H, Perri DELENA Meliton Mickey., MD, Last Rate: 200 mL/hr at 08/19/24 1244, 2 g at 08/19/24 1244   Chlorhexidine  Gluconate Cloth 2 % PADS 6 each, 6 each, Topical, Daily, Chavez, Abigail, NP, 6 each at 08/19/24 1026   feeding supplement (ENSURE PLUS HIGH PROTEIN)  liquid 237 mL, 237 mL, Oral, Q24H, Lama, Gagan S, MD, 237 mL at 08/17/24 1723   feeding supplement (GLUCERNA 1.5 CAL) liquid 1,000 mL, 1,000 mL, Per Tube, Continuous, Perri DELENA Meliton Mickey., MD, Last Rate: 45 mL/hr at 08/19/24 1731, 1,000 mL at 08/19/24 1731   free water  75 mL, 75 mL, Per Tube, Q4H, Cosette Blackwater, MD, 75 mL at 08/19/24 1514   guaiFENesin  (ROBITUSSIN) 100 MG/5ML liquid 15 mL, 15 mL, Per Tube, Q6H PRN, Drusilla Sabas RAMAN, MD, 15 mL at 08/15/24 1433   HYDROmorphone  (DILAUDID ) injection 0.5 mg, 0.5 mg, Intravenous, Q2H PRN, 0.5 mg at 08/19/24 1643 **OR** [DISCONTINUED] HYDROmorphone  (DILAUDID ) injection 1 mg, 1 mg, Intravenous, Q2H PRN, Perri DELENA Meliton Mickey., MD   insulin  aspart (novoLOG ) injection 0-9 Units, 0-9 Units, Subcutaneous, Q4H, Perri DELENA Meliton Mickey., MD, 2 Units at 08/19/24 1238   levETIRAcetam  (KEPPRA ) undiluted injection 500 mg, 500 mg, Intravenous, Q12H, Howerter, Justin B, DO, 500 mg at 08/19/24 1026   lidocaine  (LIDODERM ) 5 % 1 patch, 1 patch, Transdermal, Daily, Alekh, Kshitiz, MD, 1 patch at 08/19/24 1514   loperamide  HCl (IMODIUM ) 1 MG/7.5ML suspension 2 mg, 2 mg, Per Tube, PRN, Merilee Linsey I, RPH, 2 mg at 08/05/24 9178   naloxone  (NARCAN ) injection 0.4 mg, 0.4 mg, Intravenous, PRN, Rathore, Vasundhra, MD   ondansetron  (ZOFRAN )  tablet 4 mg, 4 mg, Per Tube, Q6H PRN **OR** ondansetron  (ZOFRAN ) injection 4 mg, 4 mg, Intravenous, Q6H PRN, Cosette Blackwater, MD   oxyCODONE  (Oxy IR/ROXICODONE ) immediate release tablet 2.5 mg, 2.5 mg, Per Tube, Q8H **AND** oxyCODONE  (Oxy IR/ROXICODONE ) immediate release tablet 2.5 mg, 2.5 mg, Oral, Q4H PRN, Perri DELENA Meliton Mickey., MD   pantoprazole  (PROTONIX ) injection 40 mg, 40 mg, Intravenous, Q12H, Perri DELENA Meliton Mickey., MD, 40 mg at 08/19/24 1027   prochlorperazine  (COMPAZINE ) injection 10 mg, 10 mg, Intravenous, Q6H PRN, Howerter, Justin B, DO   propranolol  (INDERAL ) tablet 20 mg, 20 mg, Per Tube, TID, Sigdel, Santosh, MD, 20 mg at  08/19/24 1621   senna-docusate (Senokot-S) tablet 1 tablet, 1 tablet, Oral, QHS PRN, Foust, Katy L, NP   sodium chloride  flush (NS) 0.9 % injection 10-40 mL, 10-40 mL, Intracatheter, Q12H, Sigdel, Santosh, MD, 10 mL at 08/19/24 1027   sodium chloride  flush (NS) 0.9 % injection 10-40 mL, 10-40 mL, Intracatheter, PRN, Mcarthur Pick, MD  Labs and Diagnostic Imaging   CBC:  Recent Labs  Lab 08/18/24 0614 08/18/24 1223 08/19/24 0001 08/19/24 0705  WBC 9.5  --   --  6.2  NEUTROABS 7.5  --   --  4.1  HGB 10.2*   < > 9.3* 9.4*  HCT 31.7*   < > 28.9* 29.5*  MCV 93.0  --   --  93.7  PLT 260  --   --  224   < > = values in this interval not displayed.    Basic Metabolic Panel:  Lab Results  Component Value Date   NA 140 08/19/2024   K 4.2 08/19/2024   CO2 28 08/19/2024   GLUCOSE 94 08/19/2024   BUN 22 08/19/2024   CREATININE 0.35 (L) 08/19/2024   CALCIUM  8.6 (L) 08/19/2024   GFRNONAA >60 08/19/2024   GFRAA 94 12/16/2019   Lipid Panel:  Lab Results  Component Value Date   LDLCALC 28 05/30/2024   HgbA1c:  Lab Results  Component Value Date   HGBA1C 7.2 (H) 06/01/2024   Urine Drug Screen:     Component Value Date/Time   LABOPIA POSITIVE (A) 04/20/2024 1817   COCAINSCRNUR NONE DETECTED 04/20/2024 1817   LABBENZ NONE DETECTED 04/20/2024 1817   AMPHETMU NONE DETECTED 04/20/2024 1817   THCU NONE DETECTED 04/20/2024 1817   LABBARB NONE DETECTED 04/20/2024 1817    Alcohol Level     Component Value Date/Time   ETH <15 04/20/2024 1344   INR  Lab Results  Component Value Date   INR 1.0 08/18/2024   APTT  Lab Results  Component Value Date   APTT 67 (H) 06/21/2024   Imaging personally reviewed MRI Brain(Personally reviewed): 1. Similar evolving infarcts in the anterior and posterior circulation. 2. No new infarcts, progressive mass effect or acute hemorrhage.  Assessment   Brenda [name] is a 63 y.o. female past history of CAD, DM, obesity, OSA, metastatic  endometrial cancer, prior stroke, DVT, PE on anticoagulation, recurrent UTIs with concern for cefepime  toxicity causing seizures for which she which she was started on Keppra  and cefepime  was discontinued, continues to have neurological worsening despite cessation of seizures and no new strokes on imaging.  Cervical spine imaging also unremarkable for acute process.  At this time, stiffness on her exam coupled with altered mental status is of unclear etiology. Possibilities include side effect/neurotoxicity from the PD-1 inhibitor pembrolizumab , which is being used to treat her cancer, other paraneoplastic syndrome including possibly GAD65  related stiff person syndrome. Her exam with waxy flexibility and decreased but intermittent verbalization could have been consistent with catatonia, but the time course would be unsual and lack of response to Ativan  makes this much less likely.  Steroids have been tried with no improvement and PLEX has been discussed in case this is an irAE associated with keytruda , or some other antibody drive autoimmune process.   Her CSF results do not favor acute inflammatory demyelinating  polyneuropathy/Guillain-Barr type of pathology, nor does her clinical presentation/exam.   Family is currently considering comfort care, I tried calling the husband but it went straight to voicemail several times, I will be happy to discuss with them once we can get together.   Recommendations  Could continue to consider plasmapheresis Await serum paraneoplastic panel, awaiting GAD65 ab Neurology will follow.   ______________________________________________________________________  Aisha Seals, MD [facility]   If 7pm- 7am, please page neurology on call as listed in AMION.

## 2024-08-19 NOTE — Progress Notes (Signed)
 Brief Nutrition Note  Cortrak team consulted for clogged Cortrak tube. RD able to reinsert stylet and work through clog. Successfully flushed water  3x through Cortrak tube. If tube becomes clogged again and unable to be unclogged, tube may need to be replaced. Please reach out to [facility] team as needed.     Josette Glance, MS, RDN, LDN Clinical Dietitian I Please reach out via secure chat

## 2024-08-20 DIAGNOSIS — T45AX4A Poisoning by immune checkpoint inhibitors and immunostimulant drugs, undetermined, initial encounter: Secondary | ICD-10-CM | POA: Diagnosis not present

## 2024-08-20 DIAGNOSIS — T45AX5A Adverse effect of immune checkpoint inhibitors and immunostimulant drugs, initial encounter: Secondary | ICD-10-CM | POA: Diagnosis not present

## 2024-08-20 DIAGNOSIS — Z515 Encounter for palliative care: Secondary | ICD-10-CM | POA: Diagnosis not present

## 2024-08-20 DIAGNOSIS — Z7189 Other specified counseling: Secondary | ICD-10-CM | POA: Diagnosis not present

## 2024-08-20 DIAGNOSIS — R4182 Altered mental status, unspecified: Secondary | ICD-10-CM | POA: Diagnosis not present

## 2024-08-20 DIAGNOSIS — G9341 Metabolic encephalopathy: Secondary | ICD-10-CM | POA: Diagnosis not present

## 2024-08-20 LAB — GLUCOSE, CAPILLARY
Glucose-Capillary: 180 mg/dL — ABNORMAL HIGH (ref 70–99)
Glucose-Capillary: 232 mg/dL — ABNORMAL HIGH (ref 70–99)
Glucose-Capillary: 228 mg/dL — ABNORMAL HIGH (ref 70–99)
Glucose-Capillary: 171 mg/dL — ABNORMAL HIGH (ref 70–99)
Glucose-Capillary: 161 mg/dL — ABNORMAL HIGH (ref 70–99)
Glucose-Capillary: 161 mg/dL — ABNORMAL HIGH (ref 70–99)

## 2024-08-20 LAB — BASIC METABOLIC PANEL WITH GFR
Anion gap: 7 (ref 5–15)
BUN: 25 mg/dL — ABNORMAL HIGH (ref 8–23)
CO2: 27 mmol/L (ref 22–32)
Calcium: 8.5 mg/dL — ABNORMAL LOW (ref 8.9–10.3)
Chloride: 103 mmol/L (ref 98–111)
Creatinine, Ser: 0.42 mg/dL — ABNORMAL LOW (ref 0.44–1.00)
GFR, Estimated: >60 mL/min (ref >60)
Glucose, Bld: 258 mg/dL — ABNORMAL HIGH (ref 70–99)
Potassium: 4.4 mmol/L (ref 3.5–5.1)
Sodium: 137 mmol/L (ref 135–145)

## 2024-08-20 LAB — CBC
HCT: 28.8 % — ABNORMAL LOW (ref 36.0–46.0)
Hemoglobin: 9.2 g/dL — ABNORMAL LOW (ref 12.0–15.0)
MCH: 29.9 pg (ref 26.0–34.0)
MCHC: 31.9 g/dL (ref 30.0–36.0)
MCV: 93.5 fL (ref 80.0–100.0)
Platelets: 244 K/uL (ref 150–400)
RBC: 3.08 MIL/uL — ABNORMAL LOW (ref 3.87–5.11)
RDW: 19.3 % — ABNORMAL HIGH (ref 11.5–15.5)
WBC: 6.2 K/uL (ref 4.0–10.5)
nRBC: 0 % (ref 0.0–0.2)

## 2024-08-20 LAB — CULTURE, BLOOD (ROUTINE X 2)
Culture: NO GROWTH
Culture: NO GROWTH
Special Requests: ADEQUATE
Special Requests: ADEQUATE

## 2024-08-20 LAB — MISC LABCORP TEST (SEND OUT): Labcorp test code: 143008

## 2024-08-20 LAB — MAGNESIUM: Magnesium: 2.1 mg/dL (ref 1.7–2.4)

## 2024-08-20 LAB — PHOSPHORUS: Phosphorus: 3 mg/dL (ref 2.5–4.6)

## 2024-08-20 MED ORDER — LORAZEPAM 2 MG/ML IJ SOLN
0.5000 mg | Freq: Once | INTRAMUSCULAR | Status: AC
  Administered 2024-08-20: 07:00:00 0.5 mg via INTRAVENOUS
  Filled 2024-08-20: qty 1

## 2024-08-20 MED ORDER — LORAZEPAM 2 MG/ML IJ SOLN
1.0000 mg | INTRAMUSCULAR | Status: DC | PRN
  Administered 2024-08-20 – 2024-08-24 (×19): 1 mg via INTRAVENOUS
  Filled 2024-08-20 (×20): qty 1

## 2024-08-20 MED ORDER — HYDROMORPHONE HCL 1 MG/ML IJ SOLN
0.5000 mg | INTRAMUSCULAR | Status: DC | PRN
  Administered 2024-08-20 – 2024-09-03 (×74): 0.5 mg via INTRAVENOUS
  Filled 2024-08-20 (×58): qty 0.5

## 2024-08-20 MED ORDER — HYDROMORPHONE HCL 1 MG/ML IJ SOLN
0.5000 mg | Freq: Once | INTRAMUSCULAR | Status: AC
  Administered 2024-08-20: 07:00:00 0.5 mg via INTRAVENOUS
  Filled 2024-08-20: qty 0.5

## 2024-08-20 NOTE — Progress Notes (Incomplete)
 PROGRESS NOTE    Brenda [name]  FMW:[id number] DOB: 1961-06-19 DOA: 07/15/2024 PCP: [facility]  No chief complaint on file.   Brief Narrative:   63 year old female with history of metastatic endometrial cancer, chronic cancer-related pain, hypertension, hyperlipidemia, diabetes mellitus, CVA, history of DVT and PE anticoagulated on Lovenox , OSA, chronic hypoxic respiratory failure on 2L via nasal cannula and recent hospitalization and discharged on 07/14/2024 for UTI treated with IV antibiotics followed by oral antibiotics on discharge presented with chills and weakness.  She was found to have UTI and was started on IV cefepime .  Unfortunately mental status worsened and she was transferred to [facility] for continuous EEG monitoring with 4 seizures noted on video EEG. She was on Keppra  11/16. Placed on empiric broad-spectrum antibiotics for possible meningitis/encephalitis which was ruled out based on CSF results. .    Neurology has been reconsulted 12/7 with development of rigidity.  Oncology has been reconsulted.  Family has begun to discuss palliative care, but at this point managing her cancer related pain and working on rigidity with neurology.  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  I had Brenda [name] conversation with them  about comfort focused care and hospice, but at this point, we're trying to work up and treat the acute issues below (with regards to her rigidity and encephalopathy).  Currently allowing time for outcomes with neurology's evaluation and treatment of her acute issues below.  We've planned for PLEX, but delayed with GI bleed yesterday.  Today Brenda [name] more alert, expressing desire for Brenda [name] focus on comfort.  Will work on pain control, will allow neurology to have conversation with her/family regarding plan for PLEX.  If patient/family decide to defer this, transition to full comfort measures/hospice would be appropriate (I think we're heading this direction).      Bright Red Blood Per Rectum Suspect lower GI bleed Holding anticoagulation  Hb relatively stable today IV PPI BID CT GI bleed study without evidence of GI bleed   Rigidity  Seizures Note 11/15 she had stiffening of upper extremities and was transferred to [facility] for Continuous EEG. 4 seizures noted 11/16 S/p LP, meningitis ruled out Her husband notes worsening stiffness/rigidity/inability to move extremities starting about 1 week ago  EEG without epileptiform abnormalities head CT without acute intracranial abnormality MRI brain with evolving infarcts - MRI C spine without evidence of metastatic disease Continue keppra   appreciate neurology recommendations - ddx includes PD-1 inhibitor induced side effect, paraneoplatic syndrome, stiff person syndrome.  Considering catatonia.  We've stopped prozac  (low dose, lower suspicion for serotonin syndrome?).  S/p high dose steroids 12/9-11.  S/p trial sinimet.  S/p baclofen .  Plan was for PLEX, but not sure she's up for this anymore, maybe transitioning to comfort, will follow neurology discussion with patient/family.  Follow paraneoplastic  panel, GAD65 ab.  Acute Metabolic Encephalopathy Sounds like suspicion that this and seizures above related to cefepime  Delirium precautions  She's been  better able to communicate 12/16 and 12/17 - today being more like when I first met her on 12/7. issues with somnolence on fentanyl , will balance need for opiates with sedation - likely transition to comfort based on my conversation  Klebsiella Pneumoniae UTI Leukocytosis Pyuria Afebrile, but several temps around 99 treating with abx for UTI - plan for 5 day course Blood cultures NGx2  Metastatic Endometrial Cancer Cancer Related Pain  Appreciate oncology consult  CT chest/abdomen/pelvis for persistent (worsening?) back pain (note centrally necrotic infiltrative mass in L psoas in 06/2024) CT 12/7 with R psoas muscle mass like enlargement measuring 5.0 x 4.6 cm indeterminate for tumor vs hematoma  - L psoas/paraspinal mass decreased in size.   CT 12/16 with R psoas muscle hematoma, L2 sclerotic changes with L paraspinal masslike tissue at L2 concerning for neoplastic process Appreciate oncology needs - last saw 12/15 Will continue to adjust pain meds  Elevated LFT's Monitor - relatively mild, will trend for now  Dysphagia Cortrak + tube feeds  Depression Anxiety Prozac  discontinued with rigidity  Anemia S/p 1 unit pRBC 12/1 S/p IV iron   Normal folate, b12.  Normal iron , ferritin.   Pulmonary Embolism Splenic Infarct Lovenox  on hold with GI bleed and psoas hematoma above  CAD  Hx Stroke Lovenox  on hold  Hypernatremia Resolved  Abnormal Thyroid  Function Tests Repeat TSH and free T4 wnl Negative TSI On propanolol   T2DM Lantus , SSI - adjust as needed with completion of high dose steroids (12/9-11)  Pressure Ulcer Wound 07/25/24 1100 Pressure Injury Sacrum Medial Deep Tissue Pressure Injury - Purple or maroon localized area of discolored intact skin or blood-filled blister due to damage of underlying soft tissue from pressure and/or shear. (Active)     Wound 08/11/24 0415 Pressure Injury Vertebral column Medial Stage 2 -  Partial thickness loss of dermis presenting  as Brenda [name] shallow open injury with Brenda [name] red, pink wound bed without slough. (Active)   Obesity Body mass index is 34.02 kg/m.   {Tip this will not be part of the note when signed Body mass index is 34.02 kg/m. ,  Nutrition Documentation    Flowsheet Row ED to Hosp-Admission (Current) from 07/15/2024 in [place] 3W Progressive [facility]  Nutrition Problem Moderate Malnutrition  Etiology chronic illness  [cancer]  Nutrition Goal Patient will meet greater than or equal to 90% of their needs  Interventions Prostat, Tube feeding, MVI  ,  Active Pressure Injury/Wound(s)     None          (Optional):26781}  DVT prophylaxis: lovenox  on hold with bleeding  Code Status: DNR Family Communication: [name] (at bedside), Mr. Gruenhagen (over phone) 12/17 Disposition:   Status is: Inpatient Remains inpatient appropriate because: need for continued inpatient care   Consultants:  Neurology Palliative care oncology  Procedures:  11/21 LP  EEG   Antimicrobials:  Anti-infectives (From admission, onward)    Start     Dose/Rate Route Frequency Ordered Stop   08/16/24 1100  cefTRIAXone  (ROCEPHIN ) 2 g in sodium chloride  0.9 % 100 mL IVPB        2 g 200 mL/hr over 30 Minutes Intravenous Every 24 hours 08/16/24 1001 08/21/24 1059   07/23/24 0600  vancomycin  (VANCOCIN ) IVPB 1000 mg/200 mL premix  Status:  Discontinued        1,000 mg 200 mL/hr over  60 Minutes Intravenous Every 12 hours 07/22/24 1834 07/24/24 1808   07/22/24 2200  meropenem  (MERREM ) 2 g in sodium chloride  0.9 % 100 mL IVPB  Status:  Discontinued        2 g 280 mL/hr over 30 Minutes Intravenous Every 8 hours 07/22/24 1601 07/24/24 1808   07/22/24 1700  vancomycin  (VANCOREADY) IVPB 1500 mg/300 mL        1,500 mg 150 mL/hr over 120 Minutes Intravenous  Once 07/22/24 1601 07/22/24 2031   07/22/24 1645  ampicillin  (OMNIPEN) 2 g in sodium chloride  0.9 % 100 mL IVPB  Status:  Discontinued        2 g 300 mL/hr over 20 Minutes  Intravenous Every 6 hours 07/22/24 1557 07/24/24 1800   07/19/24 0530  meropenem  (MERREM ) 1 g in sodium chloride  0.9 % 100 mL IVPB  Status:  Discontinued        1 g 200 mL/hr over 30 Minutes Intravenous Every 8 hours 07/19/24 0438 07/22/24 1601   07/16/24 0900  ceFEPIme  (MAXIPIME ) 2 g in sodium chloride  0.9 % 100 mL IVPB  Status:  Discontinued        2 g 200 mL/hr over 30 Minutes Intravenous Every 8 hours 07/16/24 0811 07/19/24 0409   07/16/24 0815  ceFEPIme  (MAXIPIME ) 2 g in sodium chloride  0.9 % 100 mL IVPB  Status:  Discontinued        2 g 200 mL/hr over 30 Minutes Intravenous  Once 07/16/24 0803 07/16/24 0811   07/16/24 0330  cefTRIAXone  (ROCEPHIN ) 2 g in sodium chloride  0.9 % 100 mL IVPB        2 g 200 mL/hr over 30 Minutes Intravenous  Once 07/16/24 0315 07/16/24 9587       Subjective:  Daughter [name] at bedside She's complaining of pain, can't take it anymore Doesn't seem interested in any more treatments - wants to focus on comfort  Objective: Vitals:   08/20/24 0731 08/20/24 0819 08/20/24 1121 08/20/24 1129  BP: (!) 137/59 (!) 111/53 (!) 146/85 (!) 146/85  Pulse: 84 77 80 80  Resp: 18 20 20 16   Temp: 98 F (36.7 C)  98.3 F (36.8 C) 98.3 F (36.8 C)  TempSrc: Oral  Oral Oral  SpO2: 100% 100% 100% 100%  Weight:      Height:        Intake/Output Summary (Last 24 hours) at 08/20/2024 1156 Last data filed at 08/20/2024 1132 Gross per 24 hour  Intake 1754.72 ml  Output 805 ml  Net 949.72 ml   Filed Weights   08/10/24 0500 08/11/24 0500 08/12/24 0345  Weight: 83.5 kg 84.4 kg 84.4 kg    Examination:  General: No acute distress. Cardiovascular: Heart sounds show Imagene Boss regular rate, and rhythm. No gallops or rubs. No murmurs. No JVD. Lungs: Clear to auscultation bilaterally with good air movement. No rales, rhonchi or wheezes. Abdomen: Soft, nontender, nondistended with normal active bowel sounds. No masses. No hepatosplenomegaly. Neurological: Alert and  oriented 3. Moves all extremities 4 with equal strength. Cranial nerves II through XII grossly intact. Skin: Warm and dry. No rashes or lesions. Extremities: No clubbing or cyanosis. No edema. Pedal pulses 2+. Psychiatric: Mood and affect are normal. Insight and judgment are ***.    Data Reviewed: I have personally reviewed following labs and imaging studies  CBC: Recent Labs  Lab 08/16/24 0830 08/17/24 0702 08/18/24 0614 08/18/24 1223 08/18/24 1850 08/19/24 0001 08/19/24 0705 08/20/24 0500  WBC 12.4* 9.5 9.5  --   --   --  6.2 6.2  NEUTROABS 9.8* 7.5 7.5  --   --   --  4.1  --   HGB 11.0* 10.4* 10.2* 9.6* 10.5* 9.3* 9.4* 9.2*  HCT 34.0* 32.1* 31.7* 30.0* 32.1* 28.9* 29.5* 28.8*  MCV 89.7 91.7 93.0  --   --   --  93.7 93.5  PLT 371 287 260  --   --   --  224 244    Basic Metabolic Panel: Recent Labs  Lab 08/16/24 0830 08/17/24 0702 08/18/24 0614 08/19/24 0705 08/20/24 0500  NA 138 136 139 140 137  K 4.0 4.1 4.1 4.2 4.4  CL 103 100 105 106 103  CO2 28 28 30 28 27   GLUCOSE 120* 266* 183* 94 258*  BUN 28* 23 19 22  25*  CREATININE 0.40* 0.47 0.37* 0.35* 0.42*  CALCIUM  8.9 8.2* 8.2* 8.6* 8.5*  MG  --  1.9 2.0 2.1 2.1  PHOS  --  3.3 3.4 4.2 3.0    GFR: Estimated Creatinine Clearance: 74.4 mL/min (Felisa Zechman) (by C-G formula based on SCr of 0.42 mg/dL (L)).  Liver Function Tests: Recent Labs  Lab 08/15/24 0545 08/16/24 0830 08/17/24 0702 08/18/24 0614 08/19/24 0705  AST 50* 43* 45* 38 40  ALT 62* 98* 118* 102* 104*  ALKPHOS 167* 163* 143* 127* 146*  BILITOT 0.5 0.6 0.6 0.6 0.4  PROT 6.1* 6.1* 5.6* 5.7* 5.4*  ALBUMIN 2.6* 2.6* 2.3* 2.4* 2.8*    CBG: Recent Labs  Lab 08/19/24 2014 08/19/24 2305 08/20/24 0359 08/20/24 0820 08/20/24 1131  GLUCAP 97 126* 180* 232* 228*     Recent Results (from the past 240 hours)  Culture, Urine (Do not remove urinary catheter, catheter placed by urology or difficult to place)     Status: Abnormal   Collection Time:  08/15/24 10:34 AM   Specimen: Urine, Catheterized  Result Value Ref Range Status   Specimen Description URINE, CATHETERIZED  Final   Special Requests   Final    NONE Performed at [facility], [address] [place], [place] [address]    Culture >=100,000 COLONIES/mL KLEBSIELLA PNEUMONIAE (Jeramia Saleeby)  Final   Report Status 08/17/2024 FINAL  Final   Organism ID, Bacteria KLEBSIELLA PNEUMONIAE (Amanii Snethen)  Final      Susceptibility   Klebsiella pneumoniae - MIC*    AMPICILLIN  >=32 RESISTANT Resistant     CEFAZOLIN  (URINE) Value in next row Resistant      RESISTANTThis is Maryelizabeth Eberle modified FDA-approved test that has been validated and its performance characteristics determined by the reporting laboratory.  This laboratory is certified under the Clinical Laboratory Improvement Amendments CLIA as qualified to perform high complexity clinical laboratory testing.    CEFEPIME  Value in next row Sensitive      RESISTANTThis is Maximiliano Cromartie modified FDA-approved test that has been validated and its performance characteristics determined by the reporting laboratory.  This laboratory is certified under the Clinical Laboratory Improvement Amendments CLIA as qualified to perform high complexity clinical laboratory testing.    ERTAPENEM Value in next row Sensitive      RESISTANTThis is Joana Nolton modified FDA-approved test that has been validated and its performance characteristics determined by the reporting laboratory.  This laboratory is certified under the Clinical Laboratory Improvement Amendments CLIA as qualified to perform high complexity clinical laboratory testing.    CEFTRIAXONE  Value in next row Sensitive      RESISTANTThis is Phyllistine Domingos modified FDA-approved test that has been validated and its performance characteristics determined by the reporting laboratory.  This laboratory is certified under the Clinical Laboratory Improvement Amendments CLIA as qualified to perform high complexity clinical laboratory testing.    CIPROFLOXACIN  Value in  next row Sensitive      RESISTANTThis is Tanique Matney modified FDA-approved test that has been validated and its performance characteristics determined by the reporting laboratory.  This laboratory is certified under the Clinical Laboratory Improvement Amendments CLIA as qualified to perform high complexity clinical laboratory testing.    GENTAMICIN Value in next row Sensitive      RESISTANTThis is Arah Aro modified FDA-approved test that has been validated and its performance characteristics determined by the reporting laboratory.  This laboratory is certified under the Clinical Laboratory Improvement Amendments CLIA as qualified to perform high complexity clinical laboratory testing.    NITROFURANTOIN Value in next row Intermediate      RESISTANTThis is Michaelpaul Apo modified FDA-approved test that has been validated and its performance characteristics determined by the reporting laboratory.  This laboratory is certified under the Clinical Laboratory Improvement Amendments CLIA as qualified to perform high complexity clinical laboratory testing.    TRIMETH /SULFA  Value in next row Sensitive      RESISTANTThis is Cece Milhouse modified FDA-approved test that has been validated and its performance characteristics determined by the reporting laboratory.  This laboratory is certified under the Clinical Laboratory Improvement Amendments CLIA as qualified to perform high complexity clinical laboratory testing.    AMPICILLIN /SULBACTAM Value in next row Resistant      RESISTANTThis is Marquie Aderhold modified FDA-approved test that has been validated and its performance characteristics determined by the reporting laboratory.  This laboratory is certified under the Clinical Laboratory Improvement Amendments CLIA as qualified to perform high complexity clinical laboratory testing.    PIP/TAZO Value in next row Sensitive      16 SENSITIVEThis is Jhanae Jaskowiak modified FDA-approved test that has been validated and its performance characteristics determined by the reporting  laboratory.  This laboratory is certified under the Clinical Laboratory Improvement Amendments CLIA as qualified to perform high complexity clinical laboratory testing.    MEROPENEM  Value in next row Sensitive      16 SENSITIVEThis is Julian Askin modified FDA-approved test that has been validated and its performance characteristics determined by the reporting laboratory.  This laboratory is certified under the Clinical Laboratory Improvement Amendments CLIA as qualified to perform high complexity clinical laboratory testing.    * >=100,000 COLONIES/mL KLEBSIELLA PNEUMONIAE  Culture, blood (Routine X 2) w Reflex to ID Panel     Status: None   Collection Time: 08/15/24 12:41 PM   Specimen: BLOOD RIGHT HAND  Result Value Ref Range Status   Specimen Description BLOOD RIGHT HAND  Final   Special Requests   Final    BOTTLES DRAWN AEROBIC AND ANAEROBIC Blood Culture adequate volume   Culture   Final    NO GROWTH 5 DAYS Performed at [facility], [address], [place], [place] [address]    Report Status 08/20/2024 FINAL  Final  Culture, blood (Routine X 2) w Reflex to ID Panel     Status: None   Collection Time: 08/15/24 12:41 PM   Specimen: BLOOD LEFT HAND  Result Value Ref Range Status   Specimen Description BLOOD LEFT HAND  Final   Special Requests   Final    BOTTLES DRAWN AEROBIC AND ANAEROBIC Blood Culture adequate volume   Culture   Final    NO GROWTH 5 DAYS Performed at [facility], [address], [place], [place] [address]  Report Status 08/20/2024 FINAL  Final         Radiology Studies: CT ANGIO GI BLEED Result Date: 08/18/2024 CLINICAL DATA:  Bright red blood per rectum. EXAM: CTA ABDOMEN AND PELVIS WITHOUT AND WITH CONTRAST TECHNIQUE: Multidetector CT imaging of the abdomen and pelvis was performed using the standard protocol during bolus administration of intravenous contrast. Multiplanar reconstructed images and MIPs were obtained and reviewed to evaluate the  vascular anatomy. RADIATION DOSE REDUCTION: This exam was performed according to the departmental dose-optimization program which includes automated exposure control, adjustment of the mA and/or kV according to patient size and/or use of iterative reconstruction technique. CONTRAST:  75mL OMNIPAQUE  IOHEXOL  350 MG/ML SOLN COMPARISON:  None Available. FINDINGS: VASCULAR Aorta: Normal caliber aorta without aneurysm, dissection, vasculitis or significant stenosis. Celiac: Patent without evidence of aneurysm, dissection, vasculitis or significant stenosis. SMA: Patent without evidence of aneurysm, dissection, vasculitis or significant stenosis. Renals: Both renal arteries are patent without evidence of aneurysm, dissection, vasculitis, fibromuscular dysplasia or significant stenosis. IMA: Patent without evidence of aneurysm, dissection, vasculitis or significant stenosis. Inflow: Patent without evidence of aneurysm, dissection, vasculitis or significant stenosis. Proximal Outflow: The visualized proximal of fluids patent. Veins: No obvious venous abnormality within the limitations of this arterial phase study. Review of the MIP images confirms the above findings. NON-VASCULAR Lower chest: The visualized lung bases are clear. Advanced coronary vascular calcification of the LAD. No intra-abdominal free air or free fluid. Hepatobiliary: The liver is unremarkable. There is dilatation, post cholecystectomy. No retained calcified stone noted in the central CBD. Pancreas: Unremarkable. No pancreatic ductal dilatation or surrounding inflammatory changes. Spleen: Normal in size without focal abnormality. Adrenals/Urinary Tract: The adrenal glands are unremarkable. There is no hydronephrosis or nephrolithiasis on either side. There is symmetric enhancement of the kidneys. The visualized ureters appear unremarkable. The urinary bladder is decompressed around Neidy Guerrieri Foley catheter. Stomach/Bowel: Feeding tube with tip in the proximal  duodenum. There is no bowel obstruction or active inflammation. No evidence of active GI bleed. The appendix is normal. Lymphatic: No adenopathy. Reproductive: Hysterectomy.  No suspicious adnexal masses. Other: None Musculoskeletal: No acute osseous pathology. Degenerative changes of the spine. Syncope L2 sclerotic changes. There is Tinzley Dalia 1.6 x 4.2 cm soft tissue mass to the left the spine at L2 concerning for Issai Werling neoplastic process. Further evaluation with MRI without and with contrast recommended. There is enlargement of the right psoas muscle with an intramuscular collection measuring 4.7 x 4.2 cm in greatest axial dimension most consistent with an intramuscular hematoma. IMPRESSION: 1. No evidence of active GI bleed. 2. No bowel obstruction. Normal appendix. 3. Right psoas intramuscular hematoma. 4. L2 sclerotic changes with left paraspinal masslike tissue at L2 concerning for Aretta Stetzel neoplastic process. Further evaluation with MRI without and with contrast recommended. Electronically Signed   By: Vanetta Chou M.D.   On: 08/18/2024 16:21          Scheduled Meds:  amLODipine   10 mg Per Tube Daily   atorvastatin   80 mg Per Tube QHS   Chlorhexidine  Gluconate Cloth  6 each Topical Daily   feeding supplement  237 mL Oral Q24H   free water   75 mL Per Tube Q4H   insulin  aspart  0-9 Units Subcutaneous Q4H   levETIRAcetam   500 mg Intravenous Q12H   lidocaine   1 patch Transdermal Daily   oxyCODONE   2.5 mg Per Tube Q8H   pantoprazole  (PROTONIX ) IV  40 mg Intravenous Q12H   propranolol   20 mg Per  Tube TID   sodium chloride  flush  10-40 mL Intracatheter Q12H   Continuous Infusions:  cefTRIAXone  (ROCEPHIN )  IV 2 g (08/20/24 1131)   feeding supplement (GLUCERNA 1.5 CAL) 1,000 mL (08/19/24 1731)     LOS: 34 days    Time spent: over 30 min  45 min critical care time with acute GI bleeding - BRBPR   Meliton Monte, MD [facility]   To contact the attending provider between 7A-7P or the  covering provider during after hours 7P-7A, please log into the web site [email or web address] and access using universal [place] password for that web site. If you do not have the password, please call the hospital operator.  08/20/2024, 11:56 AM

## 2024-08-20 NOTE — Progress Notes (Signed)
 [facility] night cross cover note:    Patient complaining of residual pain as well as anxiety following prn doses of IV Dilaudid  and oxycodone .  I subsequently ordered Dilaudid  0.5 mg IV x 1 additional dose now as well as a one-time dose of 0.5 mg of IV Ativan .    Eva Pore, DO Hospitalist

## 2024-08-20 NOTE — Progress Notes (Addendum)
 IP PROGRESS NOTE  Subjective:   Ms. Casco is more alert.  Her daughter was at the bedside when I saw her at approximately 6:45 AM.  Her daughter reports she has been speaking words.  She has been agitated and yelling out during the night.  She had rectal bleeding yesterday.    Objective: Vital signs in last 24 hours: Blood pressure (!) 146/85, pulse 80, temperature 98.3 F (36.8 C), temperature source Oral, resp. rate 16, height 5' 2 (1.575 m), weight 186 lb (84.4 kg), last menstrual period 10/30/2014, SpO2 100%.  Intake/Output from previous day: 12/17 0701 - 12/18 0700 In: 1604.7 [I.V.:898; NG/GT:396.8; IV Piggyback:310] Out: 300 [Urine:300]  Physical Exam: Alert Neurologic: Speaking in words, complains of back pain, not moving extremities to command.  She can hold the arms against gravity.    Portacath/PICC-without erythema  Lab Results: Recent Labs    08/19/24 0705 08/20/24 0500  WBC 6.2 6.2  HGB 9.4* 9.2*  HCT 29.5* 28.8*  PLT 224 244    BMET Recent Labs    08/19/24 0705 08/20/24 0500  NA 140 137  K 4.2 4.4  CL 106 103  CO2 28 27  GLUCOSE 94 258*  BUN 22 25*  CREATININE 0.35* 0.42*  CALCIUM  8.6* 8.5*     Medications: I have reviewed the patient's current medications.  Assessment/Plan:  Endometrial cancer Diagnosed in 2023, status post surgery and radiation Recurrent disease July 2025 with a left retroperitoneal mass, biopsy confirmed adenocarcinoma, ER positive, MSI-high, tumor mutation burden 24, PD-L1 75%, PIK 3CA mutated, PTEN mutated, HER2 1+, loss of MLH1 expression 04/03/2024 CT chest: Spiculated 9 x 12 mm right upper lobe and part solid 4 x 6 mm right upper lobe nodules Cycle 1 paclitaxel /carboplatin /pembrolizumab  05/01/2024 Cycle 2 paclitaxel /carboplatin /pembrolizumab  06/30/2024 06/19/2024 CT abdomen/pelvis: Right lower lobe pulmonary embolism, splenic infarct, stable size of the left psoas mass with evidence of erosive change at the anterior  aspect of L2, decreased size of left external iliac node 08/09/2024 CTs: New psoas muscle mass (tumor versus hematoma), left psoas/paraspinal mass has decreased in size, by my review the previously noted lung nodules have resolved  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  DVT 06/02/2024 4.  Pulmonary embolism noted on CT chest 06/19/2024 5.  Diabetes 6.  CHF/CAD 7.  Seizures, cefepime  toxicity?  November 2025 8.  Progressive generalized loss of motor function-related to CVAs?,  Paraneoplastic? 08/11/2024-Solu-Medrol  daily x 3 9.  Respiratory failure 10.  Pain secondary to #1 11.  Anemia 12.  Elevated liver enzymes  Ms. Izard has a history of endometrial cancer initially diagnosed in 2020.  She was diagnosed with recurrent disease involving a left retroperitoneal mass in July 2025.  She completed 2 treatments with paclitaxel /carboplatin /pembrolizumab .  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  She completed 2 cycles of systemic therapy for treatment of the uterine cancer.  I reviewed the 08/09/2024 restaging CT findings with Ms. Marner and her daughter.  The known malignancy at the left retroperitoneum has improved significantly.  The significance of the right psoas mass is unclear.  I suspect this is a benign finding.      The etiology of the diffuse generalized weakness is unclear.  There is likely a component  related to the embolic CVAs and deconditioning, but the evolution of the loss of motor function over the past several weeks appears to be more severe than expected.  She has undergone an extensive neurologic evaluation over the past month.  Neurology is following her in  the hospital..  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  She started high-dose Solu-Medrol  on 08/11/2024, given daily for 3 days.  Her neurologic status has not improved.  She is more alert today, but rigidity persists. Neurology plans to begin plasmapheresis tomorrow.   The etiology of her pain is unclear based on the restaging CT.  The retroperitoneal mass is much smaller.  She could be having pain from the right psoas lesion or another etiology.    The hemoglobin has not changed significantly after the episode of rectal bleeding yesterday.   Recommendations: Continue narcotic analgesics as needed for pain Continue evaluation of her mental status, seizures, and motor weakness per neurology, plasma exchange as recommended by neurology Anticoagulation on hold after the episode of rectal bleeding, resume per the medical service  tube feedings, follow-up with speech pathology Elevated liver enzymes-evaluate per the medical service, Lipitor ? Systemic treatment for the uterine cancer will remain on hold Antibiotics for the urinary tract infection per the medical service Please call oncology as needed, I discussed the situation with her daughter and I am available to speak with her husband today if desired, I will see her 08/24/2024             LOS: 34 days   Arley Hof, MD   08/20/2024, 7:00 PM

## 2024-08-20 NOTE — Progress Notes (Signed)
 NEUROLOGY CONSULT FOLLOW UP NOTE   Date of service: August 20, 2024 Patient Name: Brenda [name] MRN:  [id number] DOB:  [birth year]  Interval Hx/subjective   Had a discussion with the husband, they do want to pursue plasmapheresis and I think this is reasonable.  Vitals   Vitals:   08/20/24 0731 08/20/24 0819 08/20/24 1121 08/20/24 1129  BP: (!) 137/59 (!) 111/53 (!) 146/85 (!) 146/85  Pulse: 84 77 80 80  Resp: 18 20 20 16   Temp: 98 F (36.7 C)  98.3 F (36.8 C) 98.3 F (36.8 C)  TempSrc: Oral  Oral Oral  SpO2: 100% 100% 100% 100%  Weight:      Height:         Physical Exam    Neuro: Mental Status: Patient is awake, eyes are open, she answers simple questions, able to name simple objects, able to follow commands Cranial Nerves: II: blinks to threat bilaterally. Pupils are unequal, left is larger and less reactive, documented repeatedly in previous notes as chronic.  III,IV, VI: eyes are midline, but her head is turned to the right.  Motor: Tone is increased throughout. She has low amplitude trremor x 4 as well as in face.  Sensory: She responds to nox stim x 4.     Medications  Current Facility-Administered Medications:    amLODipine  (NORVASC ) tablet 10 mg, 10 mg, Per Tube, Daily, Drusilla, Sabas RAMAN, MD, 10 mg at 08/20/24 9164   artificial tears (LACRILUBE) ophthalmic ointment, , Both Eyes, Q4H PRN, Tariq, Hassan, MD, Given at 08/11/24 2102   atorvastatin  (LIPITOR ) tablet 80 mg, 80 mg, Per Tube, QHS, Sigdel, Santosh, MD, 80 mg at 08/19/24 2137   Chlorhexidine  Gluconate Cloth 2 % PADS 6 each, 6 each, Topical, Daily, Chavez, Abigail, NP, 6 each at 08/20/24 9178   feeding supplement (ENSURE PLUS HIGH PROTEIN) liquid 237 mL, 237 mL, Oral, Q24H, Drusilla, Gagan S, MD, 237 mL at 08/17/24 1723   feeding supplement (GLUCERNA 1.5 CAL) liquid 1,000 mL, 1,000 mL, Per Tube, Continuous, Perri DELENA Meliton Mickey., MD, Last Rate: 45 mL/hr at 08/19/24 1731, 1,000 mL at 08/19/24 1731    free water  75 mL, 75 mL, Per Tube, Q4H, Cosette Blackwater, MD, 75 mL at 08/20/24 1132   guaiFENesin  (ROBITUSSIN) 100 MG/5ML liquid 15 mL, 15 mL, Per Tube, Q6H PRN, Drusilla Sabas RAMAN, MD, 15 mL at 08/15/24 1433   HYDROmorphone  (DILAUDID ) injection 0.5 mg, 0.5 mg, Intravenous, Q2H PRN, 0.5 mg at 08/20/24 1504 **OR** [DISCONTINUED] HYDROmorphone  (DILAUDID ) injection 1 mg, 1 mg, Intravenous, Q2H PRN, Perri DELENA Meliton Mickey., MD   insulin  aspart (novoLOG ) injection 0-9 Units, 0-9 Units, Subcutaneous, Q4H, Perri DELENA Meliton Mickey., MD, 3 Units at 08/20/24 1135   levETIRAcetam  (KEPPRA ) undiluted injection 500 mg, 500 mg, Intravenous, Q12H, Howerter, Justin B, DO, 500 mg at 08/20/24 0835   lidocaine  (LIDODERM ) 5 % 1 patch, 1 patch, Transdermal, Daily, Alekh, Kshitiz, MD, 1 patch at 08/20/24 9176   loperamide  HCl (IMODIUM ) 1 MG/7.5ML suspension 2 mg, 2 mg, Per Tube, PRN, Merilee Linsey I, RPH, 2 mg at 08/05/24 9178   LORazepam  (ATIVAN ) injection 1 mg, 1 mg, Intravenous, Q4H PRN, Perri DELENA Meliton Mickey., MD, 1 mg at 08/20/24 1214   naloxone  (NARCAN ) injection 0.4 mg, 0.4 mg, Intravenous, PRN, Rathore, Vasundhra, MD   ondansetron  (ZOFRAN ) tablet 4 mg, 4 mg, Per Tube, Q6H PRN **OR** ondansetron  (ZOFRAN ) injection 4 mg, 4 mg, Intravenous, Q6H PRN, Cosette Blackwater, MD   oxyCODONE  (Oxy IR/ROXICODONE ) immediate  release tablet 2.5 mg, 2.5 mg, Per Tube, Q8H, 2.5 mg at 08/20/24 1424 **AND** oxyCODONE  (Oxy IR/ROXICODONE ) immediate release tablet 2.5 mg, 2.5 mg, Oral, Q4H PRN, Perri DELENA Meliton Mickey., MD, 2.5 mg at 08/20/24 1122   pantoprazole  (PROTONIX ) injection 40 mg, 40 mg, Intravenous, Q12H, Perri DELENA Meliton Mickey., MD, 40 mg at 08/20/24 9164   prochlorperazine  (COMPAZINE ) injection 10 mg, 10 mg, Intravenous, Q6H PRN, Howerter, Justin B, DO   propranolol  (INDERAL ) tablet 20 mg, 20 mg, Per Tube, TID, Sigdel, Santosh, MD, 20 mg at 08/20/24 0834   senna-docusate (Senokot-S) tablet 1 tablet, 1 tablet, Oral, QHS PRN, Foust, Katy L,  NP   sodium chloride  flush (NS) 0.9 % injection 10-40 mL, 10-40 mL, Intracatheter, Q12H, Sigdel, Santosh, MD, 10 mL at 08/20/24 9177   sodium chloride  flush (NS) 0.9 % injection 10-40 mL, 10-40 mL, Intracatheter, PRN, Mcarthur Pick, MD  Labs and Diagnostic Imaging   CBC:  Recent Labs  Lab 08/18/24 0614 08/18/24 1223 08/19/24 0705 08/20/24 0500  WBC 9.5  --  6.2 6.2  NEUTROABS 7.5  --  4.1  --   HGB 10.2*   < > 9.4* 9.2*  HCT 31.7*   < > 29.5* 28.8*  MCV 93.0  --  93.7 93.5  PLT 260  --  224 244   < > = values in this interval not displayed.    Basic Metabolic Panel:  Lab Results  Component Value Date   NA 137 08/20/2024   K 4.4 08/20/2024   CO2 27 08/20/2024   GLUCOSE 258 (H) 08/20/2024   BUN 25 (H) 08/20/2024   CREATININE 0.42 (L) 08/20/2024   CALCIUM  8.5 (L) 08/20/2024   GFRNONAA >60 08/20/2024   GFRAA 94 12/16/2019   Lipid Panel:  Lab Results  Component Value Date   LDLCALC 28 05/30/2024   HgbA1c:  Lab Results  Component Value Date   HGBA1C 7.2 (H) 06/01/2024   Urine Drug Screen:     Component Value Date/Time   LABOPIA POSITIVE (A) 04/20/2024 1817   COCAINSCRNUR NONE DETECTED 04/20/2024 1817   LABBENZ NONE DETECTED 04/20/2024 1817   AMPHETMU NONE DETECTED 04/20/2024 1817   THCU NONE DETECTED 04/20/2024 1817   LABBARB NONE DETECTED 04/20/2024 1817    Alcohol Level     Component Value Date/Time   ETH <15 04/20/2024 1344   INR  Lab Results  Component Value Date   INR 1.0 08/18/2024   APTT  Lab Results  Component Value Date   APTT 67 (H) 06/21/2024   Imaging personally reviewed MRI Brain(Personally reviewed): 1. Similar evolving infarcts in the anterior and posterior circulation. 2. No new infarcts, progressive mass effect or acute hemorrhage.  Assessment   Brenda [name] is a 63 y.o. female past history of CAD, DM, obesity, OSA, metastatic endometrial cancer, prior stroke, DVT, PE on anticoagulation, recurrent UTIs with concern for  cefepime  toxicity causing seizures for which she which she was started on Keppra  and cefepime  was discontinued, continues to have neurological worsening despite cessation of seizures and no new strokes on imaging.  Cervical spine imaging also unremarkable for acute process.  At this time, stiffness on her exam coupled with altered mental status is of unclear etiology. Possibilities include side effect/neurotoxicity from the PD-1 inhibitor pembrolizumab , which is being used to treat her cancer, other paraneoplastic syndrome including possibly GAD65 related stiff person syndrome. Her exam with waxy flexibility and decreased but intermittent verbalization could have been consistent with catatonia, but the time  course would be unsual and lack of response to Ativan  makes this much less likely.  Steroids have been tried with no improvement and PLEX has been discussed in case this is an irAE associated with keytruda , or some other antibody drive autoimmune process.   Her CSF results do not favor acute inflammatory demyelinating  polyneuropathy/Guillain-Barr type of pathology, nor does her clinical presentation/exam.   Recommendations  Plasmapheresis to start tomorrow Paraneoplastic panel not sent, will ask to get it drawn. awaiting GAD65 ab Neurology will follow.  ______________________________________________________________________  Aisha Seals, MD [facility]   If 7pm- 7am, please page neurology on call as listed in [facility].

## 2024-08-20 NOTE — Plan of Care (Signed)
  Problem: Education: Goal: Ability to describe self-care measures that may prevent or decrease complications (Diabetes Survival Skills Education) will improve Outcome: Progressing Goal: Individualized Educational Video(s) Outcome: Progressing   Problem: Fluid Volume: Goal: Ability to maintain a balanced intake and output will improve Outcome: Progressing   Problem: Health Behavior/Discharge Planning: Goal: Ability to manage health-related needs will improve Outcome: Progressing

## 2024-08-20 NOTE — Progress Notes (Signed)
 PROGRESS NOTE    Brenda [name]  FMW:[id number] DOB: [birth year] DOA: 07/15/2024 PCP: [facility]  No chief complaint on file.   Brief Narrative:   63 year old female with history of metastatic endometrial cancer, chronic cancer-related pain, hypertension, hyperlipidemia, diabetes mellitus, CVA, history of DVT and PE anticoagulated on Lovenox , OSA, chronic hypoxic respiratory failure on 2L via nasal cannula and recent hospitalization and discharged on 07/14/2024 for UTI treated with IV antibiotics followed by oral antibiotics on discharge presented with chills and weakness.  She was found to have UTI and was started on IV cefepime .  Unfortunately mental status worsened and she was transferred to [facility] for continuous EEG monitoring with 4 seizures noted on video EEG. She was on Keppra  11/16. Placed on empiric broad-spectrum antibiotics for possible meningitis/encephalitis which was ruled out based on CSF results. .    Neurology has been reconsulted 12/7 with development of rigidity.  Oncology has been reconsulted.  Family has begun to discuss palliative care, but at this point managing her cancer related pain and working on rigidity with neurology.  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  I had Brenda [name] conversation with them  about comfort focused care and hospice, but at this point, we're trying to work up and treat the acute issues below (with regards to her rigidity and encephalopathy).  At this point in time, husband is hoping to trial PLEX.  Will do our best to keep her as comfortable as possible as we pursue this treatment option with neurology assistance.    Bright Red Blood Per Rectum Suspect lower GI bleed Holding anticoagulation  Resolved at this time IV PPI BID CT GI bleed study without evidence of GI bleed   Rigidity  Seizures Note 11/15 she had stiffening of upper extremities and was transferred to [facility] for Continuous EEG. 4 seizures noted 11/16 S/p LP, meningitis ruled out Her husband notes worsening stiffness/rigidity/inability to move extremities starting about 1 week ago  EEG without epileptiform abnormalities head CT without acute intracranial abnormality MRI brain with evolving infarcts - MRI C spine without evidence of metastatic disease Continue keppra   appreciate neurology recommendations - ddx includes PD-1 inhibitor induced side effect, paraneoplatic syndrome, stiff person syndrome.  Considering catatonia.  We've stopped prozac  (low dose, lower suspicion for serotonin syndrome?).  S/p high dose steroids 12/9-11.  S/p trial sinimet.  S/p baclofen .  Plan is for PLEX 12/19.  Follow paraneoplastic panel, GAD65 ab.  Acute Metabolic Encephalopathy Sounds like suspicion that this and seizures above related to cefepime  Delirium precautions  issues with somnolence on fentanyl , will balance need for opiates with sedation   Klebsiella Pneumoniae UTI Leukocytosis Pyuria Afebrile, but several temps around 99 treating with abx for UTI - plan for 5 day course Blood cultures NGx2  Metastatic Endometrial Cancer Cancer  Related Pain  Appreciate oncology consult  CT chest/abdomen/pelvis for persistent (worsening?) back pain (note centrally necrotic infiltrative mass in L psoas in 06/2024) CT 12/7  with R psoas muscle mass like enlargement measuring 5.0 x 4.6 cm indeterminate for tumor vs hematoma  - L psoas/paraspinal mass decreased in size.   CT 12/16 with R psoas muscle hematoma, L2 sclerotic changes with L paraspinal masslike tissue at L2 concerning for neoplastic process Appreciate oncology needs - last saw 12/15 Will continue to adjust pain meds  Elevated LFT's Monitor - relatively mild, will trend for now  Dysphagia Cortrak + tube feeds  Depression Anxiety Prozac  discontinued with rigidity  Anemia S/p 1 unit pRBC 12/1 S/p IV iron   Normal folate, b12.  Normal iron , ferritin.   Pulmonary Embolism Splenic Infarct Lovenox  on hold with GI bleed and psoas hematoma above  CAD  Hx Stroke Lovenox  on hold  Hypernatremia Resolved  Abnormal Thyroid  Function Tests Repeat TSH and free T4 wnl Negative TSI On propanolol   T2DM Lantus , SSI - adjust as needed with completion of high dose steroids (12/9-11)  Pressure Ulcer Wound 07/25/24 1100 Pressure Injury Sacrum Medial Deep Tissue Pressure Injury - Purple or maroon localized area of discolored intact skin or blood-filled blister due to damage of underlying soft tissue from pressure and/or shear. (Active)     Wound 08/11/24 0415 Pressure Injury Vertebral column Medial Stage 2 -  Partial thickness loss of dermis presenting as Salome Cozby shallow open injury with Mikiah Durall red, pink wound bed without slough. (Active)   Obesity Body mass index is 34.02 kg/m.     DVT prophylaxis: lovenox  on hold with bleeding  Code Status: DNR Family Communication: [name] (at bedside) 12/18, husband over phone 12/18 Disposition:   Status is: Inpatient Remains inpatient appropriate because: need for continued inpatient care   Consultants:  Neurology Palliative care oncology  Procedures:  11/21 LP  EEG   Antimicrobials:  Anti-infectives (From admission, onward)    Start     Dose/Rate Route Frequency Ordered Stop   08/16/24 1100   cefTRIAXone  (ROCEPHIN ) 2 g in sodium chloride  0.9 % 100 mL IVPB        2 g 200 mL/hr over 30 Minutes Intravenous Every 24 hours 08/16/24 1001 08/20/24 1214   07/23/24 0600  vancomycin  (VANCOCIN ) IVPB 1000 mg/200 mL premix  Status:  Discontinued        1,000 mg 200 mL/hr over 60 Minutes Intravenous Every 12 hours 07/22/24 1834 07/24/24 1808   07/22/24 2200  meropenem  (MERREM ) 2 g in sodium chloride  0.9 % 100 mL IVPB  Status:  Discontinued        2 g 280 mL/hr over 30 Minutes Intravenous Every 8 hours 07/22/24 1601 07/24/24 1808   07/22/24 1700  vancomycin  (VANCOREADY) IVPB 1500 mg/300 mL        1,500 mg 150 mL/hr over 120 Minutes Intravenous  Once 07/22/24 1601 07/22/24 2031   07/22/24 1645  ampicillin  (OMNIPEN) 2 g in sodium chloride  0.9 % 100 mL IVPB  Status:  Discontinued        2 g 300 mL/hr over 20 Minutes Intravenous Every 6 hours 07/22/24 1557 07/24/24 1800   07/19/24 0530  meropenem  (MERREM ) 1 g in sodium chloride  0.9 % 100 mL IVPB  Status:  Discontinued        1 g 200 mL/hr over 30 Minutes Intravenous Every 8 hours 07/19/24 0438 07/22/24 1601   07/16/24 0900  ceFEPIme  (MAXIPIME ) 2 g in sodium chloride  0.9 %  100 mL IVPB  Status:  Discontinued        2 g 200 mL/hr over 30 Minutes Intravenous Every 8 hours 07/16/24 0811 07/19/24 0409   07/16/24 0815  ceFEPIme  (MAXIPIME ) 2 g in sodium chloride  0.9 % 100 mL IVPB  Status:  Discontinued        2 g 200 mL/hr over 30 Minutes Intravenous  Once 07/16/24 0803 07/16/24 0811   07/16/24 0330  cefTRIAXone  (ROCEPHIN ) 2 g in sodium chloride  0.9 % 100 mL IVPB        2 g 200 mL/hr over 30 Minutes Intravenous  Once 07/16/24 0315 07/16/24 9587       Subjective:  Daughter [name] at bedside She's moaning, calling out to her sister donna (who's passed away)  Objective: Vitals:   08/20/24 0731 08/20/24 0819 08/20/24 1121 08/20/24 1129  BP: (!) 137/59 (!) 111/53 (!) 146/85 (!) 146/85  Pulse: 84 77 80 80  Resp: 18 20 20 16   Temp: 98 F (36.7  C)  98.3 F (36.8 C) 98.3 F (36.8 C)  TempSrc: Oral  Oral Oral  SpO2: 100% 100% 100% 100%  Weight:      Height:        Intake/Output Summary (Last 24 hours) at 08/20/2024 1650 Last data filed at 08/20/2024 1627 Gross per 24 hour  Intake 1754.72 ml  Output 755 ml  Net 999.72 ml   Filed Weights   08/10/24 0500 08/11/24 0500 08/12/24 0345  Weight: 83.5 kg 84.4 kg 84.4 kg    Examination:  General: ill appearing, uncomfortable Lungs: unlabored Neurological: delirious moaning, rigid Extremities: No clubbing or cyanosis. No edema.    Data Reviewed: I have personally reviewed following labs and imaging studies  CBC: Recent Labs  Lab 08/16/24 0830 08/17/24 0702 08/18/24 0614 08/18/24 1223 08/18/24 1850 08/19/24 0001 08/19/24 0705 08/20/24 0500  WBC 12.4* 9.5 9.5  --   --   --  6.2 6.2  NEUTROABS 9.8* 7.5 7.5  --   --   --  4.1  --   HGB 11.0* 10.4* 10.2* 9.6* 10.5* 9.3* 9.4* 9.2*  HCT 34.0* 32.1* 31.7* 30.0* 32.1* 28.9* 29.5* 28.8*  MCV 89.7 91.7 93.0  --   --   --  93.7 93.5  PLT 371 287 260  --   --   --  224 244    Basic Metabolic Panel: Recent Labs  Lab 08/16/24 0830 08/17/24 0702 08/18/24 0614 08/19/24 0705 08/20/24 0500  NA 138 136 139 140 137  K 4.0 4.1 4.1 4.2 4.4  CL 103 100 105 106 103  CO2 28 28 30 28 27   GLUCOSE 120* 266* 183* 94 258*  BUN 28* 23 19 22  25*  CREATININE 0.40* 0.47 0.37* 0.35* 0.42*  CALCIUM  8.9 8.2* 8.2* 8.6* 8.5*  MG  --  1.9 2.0 2.1 2.1  PHOS  --  3.3 3.4 4.2 3.0    GFR: Estimated Creatinine Clearance: 74.4 mL/min (Khylah Kendra) (by C-G formula based on SCr of 0.42 mg/dL (L)).  Liver Function Tests: Recent Labs  Lab 08/15/24 0545 08/16/24 0830 08/17/24 0702 08/18/24 0614 08/19/24 0705  AST 50* 43* 45* 38 40  ALT 62* 98* 118* 102* 104*  ALKPHOS 167* 163* 143* 127* 146*  BILITOT 0.5 0.6 0.6 0.6 0.4  PROT 6.1* 6.1* 5.6* 5.7* 5.4*  ALBUMIN 2.6* 2.6* 2.3* 2.4* 2.8*    CBG: Recent Labs  Lab 08/19/24 2305 08/20/24 0359  08/20/24 0820 08/20/24 1131 08/20/24 1610  GLUCAP 126*  180* 232* 228* 171*     Recent Results (from the past 240 hours)  Culture, Urine (Do not remove urinary catheter, catheter placed by urology or difficult to place)     Status: Abnormal   Collection Time: 08/15/24 10:34 AM   Specimen: Urine, Catheterized  Result Value Ref Range Status   Specimen Description URINE, CATHETERIZED  Final   Special Requests   Final    NONE Performed at [facility], [address], [place], [place] [address]    Culture >=100,000 COLONIES/mL KLEBSIELLA PNEUMONIAE (Nazaret Chea)  Final   Report Status 08/17/2024 FINAL  Final   Organism ID, Bacteria KLEBSIELLA PNEUMONIAE (Criss Bartles)  Final      Susceptibility   Klebsiella pneumoniae - MIC*    AMPICILLIN  >=32 RESISTANT Resistant     CEFAZOLIN  (URINE) Value in next row Resistant      RESISTANTThis is Zelig Gacek modified FDA-approved test that has been validated and its performance characteristics determined by the reporting laboratory.  This laboratory is certified under the Clinical Laboratory Improvement Amendments CLIA as qualified to perform high complexity clinical laboratory testing.    CEFEPIME  Value in next row Sensitive      RESISTANTThis is Peggye Poon modified FDA-approved test that has been validated and its performance characteristics determined by the reporting laboratory.  This laboratory is certified under the Clinical Laboratory Improvement Amendments CLIA as qualified to perform high complexity clinical laboratory testing.    ERTAPENEM Value in next row Sensitive      RESISTANTThis is Luxe Cuadros modified FDA-approved test that has been validated and its performance characteristics determined by the reporting laboratory.  This laboratory is certified under the Clinical Laboratory Improvement Amendments CLIA as qualified to perform high complexity clinical laboratory testing.    CEFTRIAXONE  Value in next row Sensitive      RESISTANTThis is Rahmel Nedved modified FDA-approved test that has been  validated and its performance characteristics determined by the reporting laboratory.  This laboratory is certified under the Clinical Laboratory Improvement Amendments CLIA as qualified to perform high complexity clinical laboratory testing.    CIPROFLOXACIN  Value in next row Sensitive      RESISTANTThis is Tremar Wickens modified FDA-approved test that has been validated and its performance characteristics determined by the reporting laboratory.  This laboratory is certified under the Clinical Laboratory Improvement Amendments CLIA as qualified to perform high complexity clinical laboratory testing.    GENTAMICIN Value in next row Sensitive      RESISTANTThis is Margaretmary Prisk modified FDA-approved test that has been validated and its performance characteristics determined by the reporting laboratory.  This laboratory is certified under the Clinical Laboratory Improvement Amendments CLIA as qualified to perform high complexity clinical laboratory testing.    NITROFURANTOIN Value in next row Intermediate      RESISTANTThis is Jaisen Wiltrout modified FDA-approved test that has been validated and its performance characteristics determined by the reporting laboratory.  This laboratory is certified under the Clinical Laboratory Improvement Amendments CLIA as qualified to perform high complexity clinical laboratory testing.    TRIMETH /SULFA  Value in next row Sensitive      RESISTANTThis is Nicolaos Mitrano modified FDA-approved test that has been validated and its performance characteristics determined by the reporting laboratory.  This laboratory is certified under the Clinical Laboratory Improvement Amendments CLIA as qualified to perform high complexity clinical laboratory testing.    AMPICILLIN /SULBACTAM Value in next row Resistant      RESISTANTThis is Malyk Girouard modified FDA-approved test that has been validated and its performance characteristics determined by  the reporting laboratory.  This laboratory is certified under the Clinical Laboratory Improvement  Amendments CLIA as qualified to perform high complexity clinical laboratory testing.    PIP/TAZO Value in next row Sensitive      16 SENSITIVEThis is Vicenta Olds modified FDA-approved test that has been validated and its performance characteristics determined by the reporting laboratory.  This laboratory is certified under the Clinical Laboratory Improvement Amendments CLIA as qualified to perform high complexity clinical laboratory testing.    MEROPENEM  Value in next row Sensitive      16 SENSITIVEThis is Allesandra Huebsch modified FDA-approved test that has been validated and its performance characteristics determined by the reporting laboratory.  This laboratory is certified under the Clinical Laboratory Improvement Amendments CLIA as qualified to perform high complexity clinical laboratory testing.    * >=100,000 COLONIES/mL KLEBSIELLA PNEUMONIAE  Culture, blood (Routine X 2) w Reflex to ID Panel     Status: None   Collection Time: 08/15/24 12:41 PM   Specimen: BLOOD RIGHT HAND  Result Value Ref Range Status   Specimen Description BLOOD RIGHT HAND  Final   Special Requests   Final    BOTTLES DRAWN AEROBIC AND ANAEROBIC Blood Culture adequate volume   Culture   Final    NO GROWTH 5 DAYS Performed at [facility], [address] [place], [place] [address]    Report Status 08/20/2024 FINAL  Final  Culture, blood (Routine X 2) w Reflex to ID Panel     Status: None   Collection Time: 08/15/24 12:41 PM   Specimen: BLOOD LEFT HAND  Result Value Ref Range Status   Specimen Description BLOOD LEFT HAND  Final   Special Requests   Final    BOTTLES DRAWN AEROBIC AND ANAEROBIC Blood Culture adequate volume   Culture   Final    NO GROWTH 5 DAYS Performed at [facility], [address] [place], [place] [address]    Report Status 08/20/2024 FINAL  Final         Radiology Studies: No results found.         Scheduled Meds:  amLODipine   10 mg Per Tube Daily   atorvastatin   80 mg Per Tube QHS    Chlorhexidine  Gluconate Cloth  6 each Topical Daily   feeding supplement  237 mL Oral Q24H   free water   75 mL Per Tube Q4H   insulin  aspart  0-9 Units Subcutaneous Q4H   levETIRAcetam   500 mg Intravenous Q12H   lidocaine   1 patch Transdermal Daily   oxyCODONE   2.5 mg Per Tube Q8H   pantoprazole  (PROTONIX ) IV  40 mg Intravenous Q12H   propranolol   20 mg Per Tube TID   sodium chloride  flush  10-40 mL Intracatheter Q12H   Continuous Infusions:  feeding supplement (GLUCERNA 1.5 CAL) 1,000 mL (08/20/24 1617)     LOS: 34 days    Time spent: over 30 min    Meliton Monte, MD [facility]   To contact the attending provider between 7A-7P or the covering provider during after hours 7P-7A, please log into the web site [email or web address] and access using universal [place] password for that web site. If you do not have the password, please call the hospital operator.  08/20/2024, 4:50 PM

## 2024-08-20 NOTE — Inpatient Diabetes Management (Signed)
 Inpatient Diabetes Program Recommendations  AACE/ADA: New Consensus Statement on Inpatient Glycemic Control (2015)  Target Ranges:  Prepandial:   less than 140 mg/dL      Peak postprandial:   less than 180 mg/dL (1-2 hours)      Critically ill patients:  140 - 180 mg/dL   Lab Results  Component Value Date   GLUCAP 228 (H) 08/20/2024   HGBA1C 7.2 (H) 06/01/2024    Review of Glycemic Control  Latest Reference Range & Units 08/19/24 23:05 08/20/24 03:59 08/20/24 08:20 08/20/24 11:31  Glucose-Capillary 70 - 99 mg/dL 873 (H) 819 (H) 767 (H) 228 (H)   Diabetes history: DM 2 Outpatient Diabetes medications:  Tresiba  18 units daily Fiasp  1-3 units tid with meals Metformin  1000 mg bid Current orders for Inpatient glycemic control:  Glucerna 45 ml/hr Novolog  0-9 units q 4 hours  Inpatient Diabetes Program Recommendations:    May consider adding Lantus  5 units bid.   Thanks,  Randall Bullocks, RN, BC-ADM Inpatient Diabetes Coordinator Pager [phone]  (8a-5p)

## 2024-08-20 NOTE — Progress Notes (Signed)
 Daily Progress Note   Date: 08/20/2024   Patient Name: Brenda [name]  DOB: Nov 26, 1960  MRN: [id number]  Age / Sex: 63 y.o., female  Attending Physician: Perri DELENA Meliton Mickey., * Primary Care Physician: [facility] Admit Date: 07/15/2024 Length of Stay: 34 days  Reason for Follow-up: Establishing goals of care  Past Medical History:  Diagnosis Date   Anemia    Arthritis    back, hands, hips (02/22/2015)   CAD (coronary artery disease)    a. complex LAD/diagonal bifurcation PCI in 2010. b. STEMI 10/2019 s/p PTCA/DES x1 to mLAD overlapping the old stent, residual disease treated medicaly.   Cancer [facility])    uterine   CHF (congestive heart failure) (HCC)    Depression    Diabetes mellitus (HCC)    started when I was pregnant; not sure if it was type 1 or type 2    History of radiation therapy    Endometrium- HDR 01/22/22-03/07/22- Dr. Lynwood Nasuti   Hypercholesterolemia    Hypertension    Morbid obesity (HCC)    Myocardial infarction (HCC)    mild x 3   Neuromuscular disorder (HCC)    neuropathy feet   Sleep apnea    cpap/    Assessment & Plan:   HPI/Patient Profile:  63 y.o. 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   Palliative medicine consulted for goals of care conversation.   SUMMARY OF RECOMMENDATIONS DNR limited Neurology plan for plex 12/19 Continue to follow for GOC  Symptom Management:  Per primary team  Code Status: DNR - Limited (DNR/DNI)  Prognosis: Unable to determine  Discharge Planning: To Be Determined  Discussed with: Perri MD 08/20/2024 about the patient's family desire to continue to pursue plasma exchange at this point and continued work up for improvement of mentation.   Subjective:   Subjective: Chart Reviewed. Updates received.  Patient Assessed. Created space and opportunity for patient  and family to explore thoughts and feelings regarding current medical situation.  Today's Discussion:  Met with daughter [name]) at bedside. Patient was screaming Arland who is the patient's sister and Jacquetta help me repeatedly. Eyes remained closed throughout. Daughter shared that on 08/19/2024 patient did not want to pursue plasma exchange, but today has changed her mind to pursue plasma exchange. Discussed with daughter about family's current thoughts on progress and she shared that the family is most likely planning to pursue plex in hopes of improvement in her mentation. Discussed pain management again with daughter who understands that there is a balancing that needs to be done to prioritize mentation while continuing analgesics.   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 She is ill-appearing.  HENT:     Head: Normocephalic and atraumatic.     Nose: Nose normal.     Mouth/Throat:     Mouth: Mucous membranes are dry.  Cardiovascular:     Rate and Rhythm: Normal rate.  Pulmonary:     Effort: Pulmonary effort is normal.  Abdominal:     Palpations: Abdomen is soft.  Skin:    General: Skin is warm.  Psychiatric:     Comments: Anxious    Palliative Assessment/Data: 10%   Existing Vynca/ACP  Documentation: None  Thank you for allowing us  to participate in the care of Brenda [name] PMT will continue to support holistically. Brenda Cooper PA-C will follow on 08/21/2024.   I personally spent a total of 35 minutes in the care of the patient today including preparing to see the patient, performing a medically appropriate exam/evaluation, counseling and educating, referring and communicating with other health care professionals, and documenting clinical information in the EHR.   Brenda [name]  Palliative Medicine Team  Team Phone # [phone] (Nights/Weekends) 08/20/2024 5:08 PM

## 2024-08-21 DIAGNOSIS — N390 Urinary tract infection, site not specified: Secondary | ICD-10-CM | POA: Diagnosis not present

## 2024-08-21 DIAGNOSIS — R29898 Other symptoms and signs involving the musculoskeletal system: Secondary | ICD-10-CM | POA: Diagnosis not present

## 2024-08-21 DIAGNOSIS — Z515 Encounter for palliative care: Secondary | ICD-10-CM | POA: Diagnosis not present

## 2024-08-21 DIAGNOSIS — G893 Neoplasm related pain (acute) (chronic): Secondary | ICD-10-CM | POA: Diagnosis not present

## 2024-08-21 DIAGNOSIS — R531 Weakness: Secondary | ICD-10-CM | POA: Diagnosis not present

## 2024-08-21 DIAGNOSIS — F418 Other specified anxiety disorders: Secondary | ICD-10-CM | POA: Diagnosis not present

## 2024-08-21 DIAGNOSIS — T45AX4A Poisoning by immune checkpoint inhibitors and immunostimulant drugs, undetermined, initial encounter: Secondary | ICD-10-CM | POA: Diagnosis not present

## 2024-08-21 DIAGNOSIS — R4182 Altered mental status, unspecified: Secondary | ICD-10-CM | POA: Diagnosis not present

## 2024-08-21 DIAGNOSIS — T45AX5A Adverse effect of immune checkpoint inhibitors and immunostimulant drugs, initial encounter: Secondary | ICD-10-CM | POA: Diagnosis not present

## 2024-08-21 DIAGNOSIS — G9341 Metabolic encephalopathy: Secondary | ICD-10-CM | POA: Diagnosis not present

## 2024-08-21 DIAGNOSIS — Z7189 Other specified counseling: Secondary | ICD-10-CM | POA: Diagnosis not present

## 2024-08-21 LAB — CBC WITH DIFFERENTIAL/PLATELET
Abs Immature Granulocytes: 0.02 K/uL (ref 0.00–0.07)
Basophils Absolute: 0 K/uL (ref 0.0–0.1)
Basophils Relative: 0 %
Eosinophils Absolute: 0.1 K/uL (ref 0.0–0.5)
Eosinophils Relative: 3 %
HCT: 25.3 % — ABNORMAL LOW (ref 36.0–46.0)
Hemoglobin: 8 g/dL — ABNORMAL LOW (ref 12.0–15.0)
Immature Granulocytes: 0 %
Lymphocytes Relative: 22 %
Lymphs Abs: 1 K/uL (ref 0.7–4.0)
MCH: 30.3 pg (ref 26.0–34.0)
MCHC: 31.6 g/dL (ref 30.0–36.0)
MCV: 95.8 fL (ref 80.0–100.0)
Monocytes Absolute: 0.4 K/uL (ref 0.1–1.0)
Monocytes Relative: 8 %
Neutro Abs: 3 K/uL (ref 1.7–7.7)
Neutrophils Relative %: 67 %
Platelets: 182 K/uL (ref 150–400)
RBC: 2.64 MIL/uL — ABNORMAL LOW (ref 3.87–5.11)
RDW: 19.8 % — ABNORMAL HIGH (ref 11.5–15.5)
WBC: 4.5 K/uL (ref 4.0–10.5)
nRBC: 0.9 % — ABNORMAL HIGH (ref 0.0–0.2)

## 2024-08-21 LAB — COMPREHENSIVE METABOLIC PANEL WITH GFR
ALT: 167 U/L — ABNORMAL HIGH (ref 0–44)
AST: 55 U/L — ABNORMAL HIGH (ref 15–41)
Albumin: 2.8 g/dL — ABNORMAL LOW (ref 3.5–5.0)
Alkaline Phosphatase: 178 U/L — ABNORMAL HIGH (ref 38–126)
Anion gap: 5 (ref 5–15)
BUN: 22 mg/dL (ref 8–23)
CO2: 29 mmol/L (ref 22–32)
Calcium: 8.1 mg/dL — ABNORMAL LOW (ref 8.9–10.3)
Chloride: 104 mmol/L (ref 98–111)
Creatinine, Ser: 0.39 mg/dL — ABNORMAL LOW (ref 0.44–1.00)
GFR, Estimated: >60 mL/min
Glucose, Bld: 199 mg/dL — ABNORMAL HIGH (ref 70–99)
Potassium: 4.4 mmol/L (ref 3.5–5.1)
Sodium: 138 mmol/L (ref 135–145)
Total Bilirubin: 0.2 mg/dL (ref 0.0–1.2)
Total Protein: 4.9 g/dL — ABNORMAL LOW (ref 6.5–8.1)

## 2024-08-21 LAB — GLUCOSE, CAPILLARY
Glucose-Capillary: 176 mg/dL — ABNORMAL HIGH (ref 70–99)
Glucose-Capillary: 192 mg/dL — ABNORMAL HIGH (ref 70–99)
Glucose-Capillary: 207 mg/dL — ABNORMAL HIGH (ref 70–99)
Glucose-Capillary: 222 mg/dL — ABNORMAL HIGH (ref 70–99)
Glucose-Capillary: 244 mg/dL — ABNORMAL HIGH (ref 70–99)
Glucose-Capillary: 259 mg/dL — ABNORMAL HIGH (ref 70–99)

## 2024-08-21 LAB — PHOSPHORUS: Phosphorus: 2.7 mg/dL (ref 2.5–4.6)

## 2024-08-21 LAB — HEMOGLOBIN AND HEMATOCRIT, BLOOD
HCT: 27.2 % — ABNORMAL LOW (ref 36.0–46.0)
Hemoglobin: 8.6 g/dL — ABNORMAL LOW (ref 12.0–15.0)

## 2024-08-21 LAB — MAGNESIUM: Magnesium: 2.1 mg/dL (ref 1.7–2.4)

## 2024-08-21 MED ORDER — CALCIUM GLUCONATE-NACL 2-0.675 GM/100ML-% IV SOLN
2.0000 g | Freq: Once | INTRAVENOUS | Status: AC
  Administered 2024-08-21: 2000 mg via INTRAVENOUS

## 2024-08-21 MED ORDER — ORAL CARE MOUTH RINSE
15.0000 mL | OROMUCOSAL | Status: AC
  Administered 2024-08-21 – 2024-10-09 (×187): 15 mL via OROMUCOSAL

## 2024-08-21 MED ORDER — CALCIUM GLUCONATE 10 % IV SOLN
2.0000 g | Freq: Once | INTRAVENOUS | Status: DC
  Filled 2024-08-21: qty 20

## 2024-08-21 MED ORDER — GABAPENTIN 250 MG/5ML PO SOLN
100.0000 mg | Freq: Three times a day (TID) | ORAL | Status: DC
  Administered 2024-08-21 – 2024-09-04 (×42): 100 mg
  Filled 2024-08-21 (×32): qty 2

## 2024-08-21 MED ORDER — ANTICOAGULANT SODIUM CITRATE 4% (200MG/5ML) IV SOLN
5.0000 mL | Freq: Once | Status: DC
  Filled 2024-08-21: qty 5

## 2024-08-21 MED ORDER — SODIUM CHLORIDE 0.9 % IV SOLN
INTRAVENOUS | Status: AC
  Filled 2024-08-21 (×3): qty 200

## 2024-08-21 MED ORDER — ORAL CARE MOUTH RINSE
15.0000 mL | OROMUCOSAL | Status: AC | PRN
  Administered 2024-08-21: 15 mL via OROMUCOSAL

## 2024-08-21 MED ORDER — HYDROMORPHONE HCL 1 MG/ML IJ SOLN
INTRAMUSCULAR | Status: AC
  Filled 2024-08-21: qty 0.5

## 2024-08-21 MED ORDER — ACETAMINOPHEN 325 MG PO TABS
650.0000 mg | ORAL_TABLET | ORAL | Status: DC | PRN
  Administered 2024-08-21 – 2024-08-23 (×4): 650 mg via ORAL
  Filled 2024-08-21 (×4): qty 2

## 2024-08-21 MED ORDER — DIPHENHYDRAMINE HCL 12.5 MG/5ML PO ELIX
25.0000 mg | ORAL_SOLUTION | Freq: Four times a day (QID) | ORAL | Status: DC | PRN
  Administered 2024-08-21 – 2024-08-24 (×4): 25 mg via ORAL
  Filled 2024-08-21 (×5): qty 10

## 2024-08-21 MED ORDER — ACD FORMULA A 0.73-2.45-2.2 GM/100ML VI SOLN
1000.0000 mL | Status: DC
  Administered 2024-08-21: 1000 mL
  Filled 2024-08-21: qty 1000

## 2024-08-21 MED ORDER — DIPHENHYDRAMINE HCL 25 MG PO CAPS: 25.0000 mg | ORAL_CAPSULE | Freq: Four times a day (QID) | ORAL | Status: DC | PRN

## 2024-08-21 MED ORDER — SODIUM CHLORIDE 0.9 % IV SOLN
Freq: Once | INTRAVENOUS | Status: AC
  Administered 2024-08-21: 500 mL via INTRAVENOUS

## 2024-08-21 MED ORDER — CALCIUM GLUCONATE-NACL 2-0.675 GM/100ML-% IV SOLN
INTRAVENOUS | Status: AC
  Filled 2024-08-21: qty 100

## 2024-08-21 MED ORDER — HEPARIN SODIUM (PORCINE) 1000 UNIT/ML IJ SOLN
2800.0000 [IU] | Freq: Once | INTRAMUSCULAR | Status: AC
  Administered 2024-08-21: 2800 [IU]

## 2024-08-21 NOTE — Plan of Care (Signed)
" °  Problem: Education: Goal: Ability to describe self-care measures that may prevent or decrease complications (Diabetes Survival Skills Education) will improve Outcome: Progressing Goal: Individualized Educational Video(s) Outcome: Progressing   Problem: Coping: Goal: Ability to adjust to condition or change in health will improve Outcome: Progressing  Given to family member  "

## 2024-08-21 NOTE — Progress Notes (Signed)
 Spoke with Dr. Michaela this am----orders given to do the first plasmapheresis today Will verify with him for the rest of the schedule for future treatments

## 2024-08-21 NOTE — Plan of Care (Signed)
" °  Problem: Metabolic: Goal: Ability to maintain appropriate glucose levels will improve Outcome: Not Progressing   Problem: Skin Integrity: Goal: Risk for impaired skin integrity will decrease Outcome: Not Progressing   Problem: Education: Goal: Knowledge of General Education information will improve Description: Including pain rating scale, medication(s)/side effects and non-pharmacologic comfort measures Outcome: Not Progressing   Problem: Clinical Measurements: Goal: Respiratory complications will improve Outcome: Not Progressing   Problem: Activity: Goal: Risk for activity intolerance will decrease Outcome: Not Progressing   Problem: Pain Managment: Goal: General experience of comfort will improve and/or be controlled Outcome: Not Progressing   "

## 2024-08-21 NOTE — Progress Notes (Signed)
 " Daily Progress Note   Date: 08/21/2024   Patient Name: Brenda [name]  DOB: 09/15/1960  MRN: [id number]  Age / Sex: 63 y.o., female  Attending Physician: Brenda DELENA Meliton Mickey., * Primary Care Physician: Brenda [facility] Admit Date: 07/15/2024 Length of Stay: 35 days  Reason for Follow-up: Establishing goals of care  Assessment & Plan:   HPI/Patient Profile:  63 y.o. 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   Palliative medicine consulted for goals of care conversation.   SUMMARY OF RECOMMENDATIONS Continue DNR limited Patient's family remain agreeable to plan for [facility] today 12/19.  Husband notes that he is interested in transfer to another hospital if she does not improve with this treatment Psychosocial and emotional support provided Continue to follow for GOC. PMT will see again Monday 12/22  Symptom Management:  Per primary team  Code Status: DNR - Limited (DNR/DNI)  Prognosis: Unable to determine  Discharge Planning: To Be Determined  Discussed with: Perri MD, Michaela MD, patient's husband and daughter  Subjective:   Subjective: Chart reviewed including progress notes, labs, imaging. Patient assessed at the bedside.  She was resting comfortably.  I did not attempt to arouse her in order to preserve her comfort.  Her daughter was present visiting.  Created space and opportunity for patient's family to explore thoughts and feelings regarding patient's current medical situation.  Her daughter shares the difficulty they have had in navigating patient's suffering and patient's changing feelings regarding her wishes/care preferences.  They are trying their best to honor what she wants while minimizing suffering.  She feels that they have had good conversations with the current  attending as well as other specialists currently involved in her care.  She has not talked to her father in a while and not sure how he currently feels.  Called patient's husband Brenda [name] to provide ongoing palliative support.  He confirms that patient has voiced interest in plasmapheresis during her latest lucid moment and his plan is to honor this.  He feels she does not want to have anything done when she is in extreme pain.  She does not even want to hold his hand.  He continues to feel frustrated by the overall care provided by [facility] dating back to August and that we should not even be in the situation right now.  Emotional support and therapeutic listening was provided.    Brenda [name] feels ignored when he attempts to give suggestions and use his logic to think of ideas.  For example, he notes that patient seems to always have a better mentation when on antibiotics.  He has wanted follow-up cultures after a round of antibiotics as well.  He feels that hospice has been pushed for longer than the options to help her have been looked at.  He would like to revisit a transfer request to another facility if plasmapheresis does not work, as he understands that this is the last option and that [facility] has nothing else to offer after this.  He is agreeable to speaking again with PMT on Monday after allowing more time for outcomes.  I encouraged him to call for support if needed over the weekend.  Questions and concerns addressed.  PMT will continue to follow and support.  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 She is ill-appearing.  HENT:     Head: Normocephalic and atraumatic.     Nose: Nose normal.     Mouth/Throat:     Mouth: Mucous membranes are dry.  Cardiovascular:     Rate and Rhythm: Normal rate.  Pulmonary:     Effort: Pulmonary effort is normal.  Abdominal:     Palpations: Abdomen is soft.  Skin:    General: Skin is warm.  Neurological:     Comments: Sleeping, did not assess    Palliative Assessment/Data: 30% (on tube feeds)    Billing based on time  Time Total: 50  Visit consisted of counseling and education dealing with the complex and emotionally intense issues of symptom management and palliative care in the setting of serious and potentially life-threatening illness. Greater than 50% of this time was spent counseling and coordinating care related to the above assessment and plan.  Personally spent 50 minutes in patient care including extensive chart review (labs, imaging, progress/consult notes, vital signs), medically appropraite exam, discussed with treatment team, education to patient, family, and staff, documenting clinical information, medication review and management, coordination of care, and available advanced directive documents.    "

## 2024-08-21 NOTE — TOC Progression Note (Signed)
 Transition of Care [facility]) - Progression Note    Patient Details  Name: Brenda [name] MRN: [id number] Date of Birth: 06/08/1961  Transition of [facility]) CM/SW Contact  [name], [place] Phone Number: 08/21/2024, 8:41 AM  Clinical Narrative:   CSW continuing to follow. Patient not medically stable, starting plasma exchange. Palliative continuing to follow for goals of care. CSW to follow.    Expected Discharge Plan:  (TBD) Barriers to Discharge: Continued Medical Work up               Expected Discharge Plan and Services                                               Social Drivers of Health (SDOH) Interventions SDOH Screenings   Food Insecurity: No Food Insecurity (07/16/2024)  Recent Concern: Food Insecurity - Food Insecurity Present (04/20/2024)  Housing: Low Risk (07/16/2024)  Transportation Needs: No Transportation Needs (07/16/2024)  Utilities: Not At Risk (07/16/2024)  Depression (PHQ2-9): Low Risk (06/30/2024)  Recent Concern: Depression (PHQ2-9) - Medium Risk (05/07/2024)  Social Connections: Moderately Integrated (07/13/2024)  Tobacco Use: Medium Risk (07/16/2024)    Readmission Risk Interventions    07/14/2024    2:34 PM 05/04/2024   10:00 AM  Readmission Risk Prevention Plan  Transportation Screening Complete Complete  PCP or Specialist Appt within 3-5 Days  Complete  HRI or Home Care Consult  Complete  Social Work Consult for Recovery Care Planning/Counseling  Complete  Palliative Care Screening  Not Applicable  Medication Review Oceanographer) Complete Complete  PCP or Specialist appointment within 3-5 days of discharge Complete   HRI or Home Care Consult Complete   SW Recovery Care/Counseling Consult Complete   Palliative Care Screening Not Applicable   Skilled Nursing Facility Not Applicable

## 2024-08-21 NOTE — Progress Notes (Addendum)
 " PROGRESS NOTE    Jadamarie Butson  FMW:[id number] DOB: 12-Jan-1961 DOA: 07/15/2024 PCP: [facility]  No chief complaint on file.   Brief Narrative:   63 year old female with history of metastatic endometrial cancer, chronic cancer-related pain, hypertension, hyperlipidemia, diabetes mellitus, CVA, history of DVT and PE anticoagulated on Lovenox , OSA, chronic hypoxic respiratory failure on 2L via nasal cannula and recent hospitalization and discharged on 07/14/2024 for UTI treated with IV antibiotics followed by oral antibiotics on discharge presented with chills and weakness.  She was found to have UTI and was started on IV cefepime .  Unfortunately mental status worsened and she was transferred to [facility] for continuous EEG monitoring with 4 seizures noted on video EEG. She was on Keppra  11/16. Placed on empiric broad-spectrum antibiotics for possible meningitis/encephalitis which was ruled out based on CSF results. .    Neurology has been reconsulted 12/7 with development of rigidity.  Oncology has been reconsulted.  Family has begun to discuss palliative care, but at this point managing her cancer related pain and working on rigidity with neurology.  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 Keil and her husband beginning to talk about palliative care on the day I met them (12/7).  I had Zivah Mayr conversation with them  about comfort focused care and hospice, but at this point, we're trying to work up and treat the acute issues below (with regards to her rigidity and encephalopathy).  At this point in time, husband is hoping to trial PLEX.  Will do our best to keep her as comfortable as possible as we pursue this treatment option with neurology assistance.  Per palliative, husband asking about transfer if she's not improved with this, will address as needed after plex.  12:55 PM Called husband over phone to update regarding plan for plex, set expectations regarding several treatments.  He asked about epidural for her pain, I don't think this is currently indicated/appropriate.  He was frustrated with this answer.    Bright Red Blood Per Rectum Suspect lower GI bleed Holding anticoagulation  Resolved at this time IV PPI BID CT GI bleed study without evidence of GI bleed   Rigidity  Seizures Note 11/15 she had stiffening of upper extremities and was transferred to [facility] for Continuous EEG. 4 seizures noted 11/16 S/p LP, meningitis ruled out Her husband notes worsening stiffness/rigidity/inability to move extremities starting about 1 week ago  EEG without epileptiform abnormalities head CT without acute intracranial abnormality MRI brain with evolving infarcts - MRI C spine without evidence of metastatic disease Continue keppra   appreciate neurology recommendations - ddx includes PD-1 inhibitor induced side effect, paraneoplatic syndrome, stiff person syndrome.  Considering catatonia.  We've stopped prozac  (low dose, lower suspicion for serotonin syndrome?).  S/p high dose steroids 12/9-11.  S/p trial sinimet.  S/p baclofen .  Plan is for PLEX 12/19.  Follow paraneoplastic panel, GAD65 ab.  Acute  Metabolic Encephalopathy Sounds like suspicion that this and seizures above related to cefepime  Delirium precautions  issues with somnolence on fentanyl , will balance need for opiates with sedation   Klebsiella  Pneumoniae UTI Leukocytosis Pyuria Afebrile, but several temps around 99 treating with abx for UTI - plan for 5 day course Blood cultures NGx2  Metastatic Endometrial Cancer Cancer Related Pain  Appreciate oncology consult  CT chest/abdomen/pelvis for persistent (worsening?) back pain (note centrally necrotic infiltrative mass in L psoas in 06/2024) CT 12/7 with R psoas muscle mass like enlargement measuring 5.0 x 4.6 cm indeterminate for tumor vs hematoma  - L psoas/paraspinal mass decreased in size.   CT 12/16 with R psoas muscle hematoma, L2 sclerotic changes with L paraspinal masslike tissue at L2 concerning for neoplastic process Appreciate oncology needs - last saw 12/15 Will continue to adjust pain meds  Elevated LFT's Monitor - relatively mild, will trend for now  Dysphagia Cortrak + tube feeds  Depression Anxiety Prozac  discontinued with rigidity  Anemia S/p 1 unit pRBC 12/1 S/p IV iron   Normal folate, b12.  Normal iron , ferritin.  Noted downtrend, repeat  Pulmonary Embolism Splenic Infarct Lovenox  on hold with GI bleed and psoas hematoma above  CAD  Hx Stroke Lovenox  on hold  Hypernatremia Resolved  Abnormal Thyroid  Function Tests Repeat TSH and free T4 wnl Negative TSI On propanolol   T2DM Lantus , SSI - adjust as needed with completion of high dose steroids (12/9-11)  Pressure Ulcer Wound 07/25/24 1100 Pressure Injury Sacrum Medial Deep Tissue Pressure Injury - Purple or maroon localized area of discolored intact skin or blood-filled blister due to damage of underlying soft tissue from pressure and/or shear. (Active)     Wound 08/11/24 0415 Pressure Injury Vertebral column Medial Stage 2 -  Partial thickness loss of dermis presenting as Esgar Barnick shallow open injury with Cort Dragoo red, pink wound bed without slough. (Active)   Obesity Body mass index is 31.83 kg/m.     DVT prophylaxis: lovenox  on hold with bleeding  Code Status: DNR Family Communication:  [name] (at bedside) 12/18, husband over phone 12/18 Disposition:   Status is: Inpatient Remains inpatient appropriate because: need for continued inpatient care   Consultants:  Neurology Palliative care oncology  Procedures:  11/21 LP  EEG   Antimicrobials:  Anti-infectives (From admission, onward)    Start     Dose/Rate Route Frequency Ordered Stop   08/16/24 1100  cefTRIAXone  (ROCEPHIN ) 2 g in sodium chloride  0.9 % 100 mL IVPB        2 g 200 mL/hr over 30 Minutes Intravenous Every 24 hours 08/16/24 1001 08/20/24 1214   07/23/24 0600  vancomycin  (VANCOCIN ) IVPB 1000 mg/200 mL premix  Status:  Discontinued        1,000 mg 200 mL/hr over 60 Minutes Intravenous Every 12 hours 07/22/24 1834 07/24/24 1808   07/22/24 2200  meropenem  (MERREM ) 2 g in sodium chloride  0.9 % 100 mL IVPB  Status:  Discontinued        2 g 280 mL/hr over 30 Minutes Intravenous Every 8 hours 07/22/24 1601 07/24/24 1808   07/22/24 1700  vancomycin  (VANCOREADY) IVPB 1500 mg/300 mL        1,500 mg 150 mL/hr over 120 Minutes Intravenous  Once 07/22/24 1601 07/22/24 2031   07/22/24 1645  ampicillin  (OMNIPEN) 2 g in sodium chloride  0.9 % 100 mL IVPB  Status:  Discontinued        2 g 300 mL/hr over 20 Minutes Intravenous  Every 6 hours 07/22/24 1557 07/24/24 1800   07/19/24 0530  meropenem  (MERREM ) 1 g in sodium chloride  0.9 % 100 mL IVPB  Status:  Discontinued        1 g 200 mL/hr over 30 Minutes Intravenous Every 8 hours 07/19/24 0438 07/22/24 1601   07/16/24 0900  ceFEPIme  (MAXIPIME ) 2 g in sodium chloride  0.9 % 100 mL IVPB  Status:  Discontinued        2 g 200 mL/hr over 30 Minutes Intravenous Every 8 hours 07/16/24 0811 07/19/24 0409   07/16/24 0815  ceFEPIme  (MAXIPIME ) 2 g in sodium chloride  0.9 % 100 mL IVPB  Status:  Discontinued        2 g 200 mL/hr over 30 Minutes Intravenous  Once 07/16/24 0803 07/16/24 0811   07/16/24 0330  cefTRIAXone  (ROCEPHIN ) 2 g in sodium chloride  0.9 % 100 mL IVPB         2 g 200 mL/hr over 30 Minutes Intravenous  Once 07/16/24 0315 07/16/24 0412       Subjective:  Daughter [name] at bedside Asleep this AM  Objective: Vitals:   08/20/24 2346 08/21/24 0347 08/21/24 0736 08/21/24 0800  BP: (!) 126/55 135/83 137/65   Pulse: 64 72 68   Resp: 18 18 20    Temp: 100.2 F (37.9 C) 99.9 F (37.7 C) 98.1 F (36.7 C)   TempSrc: Oral Oral Axillary   SpO2: 100% 100% 100%   Weight:    78.9 kg  Height:    5' 2 (1.575 m)    Intake/Output Summary (Last 24 hours) at 08/21/2024 1047 Last data filed at 08/21/2024 0800 Gross per 24 hour  Intake 2130 ml  Output 625 ml  Net 1505 ml   Filed Weights   08/11/24 0500 08/12/24 0345 08/21/24 0800  Weight: 84.4 kg 84.4 kg 78.9 kg    Examination:  General: ill appearing Lungs: unlabored Neurological: sedate (I did not to try to wake her with recent delirium/pain when awake) Extremities: No clubbing or cyanosis. No edema.    Data Reviewed: I have personally reviewed following labs and imaging studies  CBC: Recent Labs  Lab 08/16/24 0830 08/17/24 0702 08/18/24 0614 08/18/24 1223 08/18/24 1850 08/19/24 0001 08/19/24 0705 08/20/24 0500 08/21/24 0634  WBC 12.4* 9.5 9.5  --   --   --  6.2 6.2 4.5  NEUTROABS 9.8* 7.5 7.5  --   --   --  4.1  --  3.0  HGB 11.0* 10.4* 10.2*   < > 10.5* 9.3* 9.4* 9.2* 8.0*  HCT 34.0* 32.1* 31.7*   < > 32.1* 28.9* 29.5* 28.8* 25.3*  MCV 89.7 91.7 93.0  --   --   --  93.7 93.5 95.8  PLT 371 287 260  --   --   --  224 244 182   < > = values in this interval not displayed.    Basic Metabolic Panel: Recent Labs  Lab 08/17/24 0702 08/18/24 0614 08/19/24 0705 08/20/24 0500 08/21/24 0634  NA 136 139 140 137 138  K 4.1 4.1 4.2 4.4 4.4  CL 100 105 106 103 104  CO2 28 30 28 27 29   GLUCOSE 266* 183* 94 258* 199*  BUN 23 19 22  25* 22  CREATININE 0.47 0.37* 0.35* 0.42* 0.39*  CALCIUM  8.2* 8.2* 8.6* 8.5* 8.1*  MG 1.9 2.0 2.1 2.1 2.1  PHOS 3.3 3.4 4.2 3.0 2.7     GFR: Estimated Creatinine Clearance: 71.8 mL/min (Yecenia Dalgleish) (by C-G formula  based on SCr of 0.39 mg/dL (L)).  Liver Function Tests: Recent Labs  Lab 08/16/24 0830 08/17/24 0702 08/18/24 0614 08/19/24 0705 08/21/24 0634  AST 43* 45* 38 40 55*  ALT 98* 118* 102* 104* 167*  ALKPHOS 163* 143* 127* 146* 178*  BILITOT 0.6 0.6 0.6 0.4 0.2  PROT 6.1* 5.6* 5.7* 5.4* 4.9*  ALBUMIN 2.6* 2.3* 2.4* 2.8* 2.8*    CBG: Recent Labs  Lab 08/20/24 1610 08/20/24 1941 08/20/24 2301 08/21/24 0348 08/21/24 0735  GLUCAP 171* 161* 161* 176* 222*     Recent Results (from the past 240 hours)  Culture, Urine (Do not remove urinary catheter, catheter placed by urology or difficult to place)     Status: Abnormal   Collection Time: 08/15/24 10:34 AM   Specimen: Urine, Catheterized  Result Value Ref Range Status   Specimen Description URINE, CATHETERIZED  Final   Special Requests   Final    NONE Performed at [facility], [address] [place], [place] [address]    Culture >=100,000 COLONIES/mL KLEBSIELLA PNEUMONIAE (Reather Steller)  Final   Report Status 08/17/2024 FINAL  Final   Organism ID, Bacteria KLEBSIELLA PNEUMONIAE (Martese Vanatta)  Final      Susceptibility   Klebsiella pneumoniae - MIC*    AMPICILLIN  >=32 RESISTANT Resistant     CEFAZOLIN  (URINE) Value in next row Resistant      RESISTANTThis is Lumir Demetriou modified FDA-approved test that has been validated and its performance characteristics determined by the reporting laboratory.  This laboratory is certified under the Clinical Laboratory Improvement Amendments CLIA as qualified to perform high complexity clinical laboratory testing.    CEFEPIME  Value in next row Sensitive      RESISTANTThis is Doriana Mazurkiewicz modified FDA-approved test that has been validated and its performance characteristics determined by the reporting laboratory.  This laboratory is certified under the Clinical Laboratory Improvement Amendments CLIA as qualified to perform high complexity clinical  laboratory testing.    ERTAPENEM Value in next row Sensitive      RESISTANTThis is Samon Dishner modified FDA-approved test that has been validated and its performance characteristics determined by the reporting laboratory.  This laboratory is certified under the Clinical Laboratory Improvement Amendments CLIA as qualified to perform high complexity clinical laboratory testing.    CEFTRIAXONE  Value in next row Sensitive      RESISTANTThis is Anjanae Woehrle modified FDA-approved test that has been validated and its performance characteristics determined by the reporting laboratory.  This laboratory is certified under the Clinical Laboratory Improvement Amendments CLIA as qualified to perform high complexity clinical laboratory testing.    CIPROFLOXACIN  Value in next row Sensitive      RESISTANTThis is Shaquera Ansley modified FDA-approved test that has been validated and its performance characteristics determined by the reporting laboratory.  This laboratory is certified under the Clinical Laboratory Improvement Amendments CLIA as qualified to perform high complexity clinical laboratory testing.    GENTAMICIN Value in next row Sensitive      RESISTANTThis is Danaria Larsen modified FDA-approved test that has been validated and its performance characteristics determined by the reporting laboratory.  This laboratory is certified under the Clinical Laboratory Improvement Amendments CLIA as qualified to perform high complexity clinical laboratory testing.    NITROFURANTOIN Value in next row Intermediate      RESISTANTThis is Kyrie Bun modified FDA-approved test that has been validated and its performance characteristics determined by the reporting laboratory.  This laboratory is certified under the Clinical Laboratory Improvement Amendments CLIA as qualified to perform high  complexity clinical laboratory testing.    TRIMETH /SULFA  Value in next row Sensitive      RESISTANTThis is Cowen Pesqueira modified FDA-approved test that has been validated and its performance characteristics  determined by the reporting laboratory.  This laboratory is certified under the Clinical Laboratory Improvement Amendments CLIA as qualified to perform high complexity clinical laboratory testing.    AMPICILLIN /SULBACTAM Value in next row Resistant      RESISTANTThis is Lezley Bedgood modified FDA-approved test that has been validated and its performance characteristics determined by the reporting laboratory.  This laboratory is certified under the Clinical Laboratory Improvement Amendments CLIA as qualified to perform high complexity clinical laboratory testing.    PIP/TAZO Value in next row Sensitive      16 SENSITIVEThis is Nanette Wirsing modified FDA-approved test that has been validated and its performance characteristics determined by the reporting laboratory.  This laboratory is certified under the Clinical Laboratory Improvement Amendments CLIA as qualified to perform high complexity clinical laboratory testing.    MEROPENEM  Value in next row Sensitive      16 SENSITIVEThis is Dearra Myhand modified FDA-approved test that has been validated and its performance characteristics determined by the reporting laboratory.  This laboratory is certified under the Clinical Laboratory Improvement Amendments CLIA as qualified to perform high complexity clinical laboratory testing.    * >=100,000 COLONIES/mL KLEBSIELLA PNEUMONIAE  Culture, blood (Routine X 2) w Reflex to ID Panel     Status: None   Collection Time: 08/15/24 12:41 PM   Specimen: BLOOD RIGHT HAND  Result Value Ref Range Status   Specimen Description BLOOD RIGHT HAND  Final   Special Requests   Final    BOTTLES DRAWN AEROBIC AND ANAEROBIC Blood Culture adequate volume   Culture   Final    NO GROWTH 5 DAYS Performed at [facility], [address], [place], [place] [address]    Report Status 08/20/2024 FINAL  Final  Culture, blood (Routine X 2) w Reflex to ID Panel     Status: None   Collection Time: 08/15/24 12:41 PM   Specimen: BLOOD LEFT HAND  Result Value Ref  Range Status   Specimen Description BLOOD LEFT HAND  Final   Special Requests   Final    BOTTLES DRAWN AEROBIC AND ANAEROBIC Blood Culture adequate volume   Culture   Final    NO GROWTH 5 DAYS Performed at [facility], [address], [place], [place] [address]    Report Status 08/20/2024 FINAL  Final         Radiology Studies: No results found.         Scheduled Meds:  amLODipine   10 mg Per Tube Daily   atorvastatin   80 mg Per Tube QHS   Chlorhexidine  Gluconate Cloth  6 each Topical Daily   feeding supplement  237 mL Oral Q24H   free water   75 mL Per Tube Q4H   insulin  aspart  0-9 Units Subcutaneous Q4H   levETIRAcetam   500 mg Intravenous Q12H   lidocaine   1 patch Transdermal Daily   oxyCODONE   2.5 mg Per Tube Q8H   pantoprazole  (PROTONIX ) IV  40 mg Intravenous Q12H   propranolol   20 mg Per Tube TID   sodium chloride  flush  10-40 mL Intracatheter Q12H   Continuous Infusions:  feeding supplement (GLUCERNA 1.5 CAL) 45 mL/hr at 08/21/24 0700     LOS: 35 days    Time spent: over 30 min    Meliton Monte, MD [facility]  To contact the attending provider between 7A-7P or the covering provider during after hours 7P-7A, please log into the web site [email or web address] and access using universal [place] password for that web site. If you do not have the password, please call the hospital operator.  08/21/2024, 10:47 AM    "

## 2024-08-21 NOTE — Progress Notes (Signed)
 NEUROLOGY CONSULT FOLLOW UP NOTE   Date of service: August 21, 2024 Patient Name: Brenda [name] MRN:  [id number] DOB:  [birth year]  Interval Hx/subjective   Continues to be very agitated when awake  Vitals   Vitals:   08/21/24 1730 08/21/24 1745 08/21/24 1758 08/21/24 1811  BP: (!) 89/49 128/64 (!) 141/32   Pulse: 61 62 64 60  Resp: 20 20 18    Temp: 98.8 F (37.1 C) 98.9 F (37.2 C) 98 F (36.7 C)   TempSrc: Axillary Axillary Axillary   SpO2: 100% 100% 100%   Weight:      Height:         Physical Exam    Neuro: Mental Status: Patient is sleeping, but she does arouse and answer   Yes to if she is in pain ranial Nerves: II: blinks to threat bilaterally. Pupils are unequal, left is larger and less reactive, documented repeatedly in previous notes as chronic.  III,IV, VI: eyes are midline, but her head is turned to the right.  Motor: Tone is increased throughout. Sensory: She responds to nox stim x 4.     Medications  Current Facility-Administered Medications:    acetaminophen  (TYLENOL ) tablet 650 mg, 650 mg, Oral, Q4H PRN, Michaela Aisha SQUIBB, MD, 650 mg at 08/21/24 1440   amLODipine  (NORVASC ) tablet 10 mg, 10 mg, Per Tube, Daily, Drusilla, Sabas RAMAN, MD, 10 mg at 08/21/24 1129   anticoagulant sodium citrate solution 5 mL, 5 mL, Intracatheter, Once, Michaela, Aisha SQUIBB, MD   artificial tears (LACRILUBE) ophthalmic ointment, , Both Eyes, Q4H PRN, Cosette Blackwater, MD, Given at 08/11/24 2102   atorvastatin  (LIPITOR ) tablet 80 mg, 80 mg, Per Tube, QHS, Sigdel, Santosh, MD, 80 mg at 08/20/24 2048   calcium  gluconate inj 10% (1 g) URGENT USE ONLY!, 2 g, Intravenous, Once, Michaela Aisha SQUIBB, MD   Chlorhexidine  Gluconate Cloth 2 % PADS 6 each, 6 each, Topical, Daily, Chavez, Abigail, NP, 6 each at 08/21/24 1129   citrate dextrose  (ACD-A anticoagulant) solution 1,000 mL, 1,000 mL, Other, Continuous, Michaela Aisha SQUIBB, MD, 1,000 mL at 08/21/24 1601    diphenhydrAMINE  (BENADRYL ) 12.5 MG/5ML elixir 25 mg, 25 mg, Oral, Q6H PRN, Perri DELENA Meliton Mickey., MD, 25 mg at 08/21/24 1509   feeding supplement (GLUCERNA 1.5 CAL) liquid 1,000 mL, 1,000 mL, Per Tube, Continuous, Perri DELENA Meliton Mickey., MD, Last Rate: 45 mL/hr at 08/21/24 1834, Restarted at 08/21/24 1834   free water  75 mL, 75 mL, Per Tube, Q4H, Cosette Blackwater, MD, 75 mL at 08/21/24 1129   gabapentin  (NEURONTIN ) 250 MG/5ML solution 100 mg, 100 mg, Per Tube, Q8H, Perri DELENA Meliton Mickey., MD, 100 mg at 08/21/24 1427   guaiFENesin  (ROBITUSSIN) 100 MG/5ML liquid 15 mL, 15 mL, Per Tube, Q6H PRN, Drusilla Sabas RAMAN, MD, 15 mL at 08/15/24 1433   HYDROmorphone  (DILAUDID ) injection 0.5 mg, 0.5 mg, Intravenous, Q2H PRN, 0.5 mg at 08/21/24 1802 **OR** [DISCONTINUED] HYDROmorphone  (DILAUDID ) injection 1 mg, 1 mg, Intravenous, Q2H PRN, Perri DELENA Meliton Mickey., MD   insulin  aspart (novoLOG ) injection 0-9 Units, 0-9 Units, Subcutaneous, Q4H, Perri DELENA Meliton Mickey., MD, 5 Units at 08/21/24 1144   levETIRAcetam  (KEPPRA ) undiluted injection 500 mg, 500 mg, Intravenous, Q12H, Howerter, Justin B, DO, 500 mg at 08/21/24 1119   lidocaine  (LIDODERM ) 5 % 1 patch, 1 patch, Transdermal, Daily, Alekh, Kshitiz, MD, 1 patch at 08/21/24 1117   LORazepam  (ATIVAN ) injection 1 mg, 1 mg, Intravenous, Q4H PRN, Perri DELENA Meliton Mickey., MD, 1 mg  at 08/21/24 1829   naloxone  (NARCAN ) injection 0.4 mg, 0.4 mg, Intravenous, PRN, Alfornia Madison, MD   ondansetron  (ZOFRAN ) tablet 4 mg, 4 mg, Per Tube, Q6H PRN **OR** ondansetron  (ZOFRAN ) injection 4 mg, 4 mg, Intravenous, Q6H PRN, Cosette Blackwater, MD   Oral care mouth rinse, 15 mL, Mouth Rinse, 4 times per day, Perri DELENA Meliton Mickey., MD   Oral care mouth rinse, 15 mL, Mouth Rinse, PRN, Perri DELENA Meliton Mickey., MD, 15 mL at 08/21/24 1145   oxyCODONE  (Oxy IR/ROXICODONE ) immediate release tablet 2.5 mg, 2.5 mg, Per Tube, Q8H, 2.5 mg at 08/21/24 1426 **AND** oxyCODONE  (Oxy IR/ROXICODONE ) immediate release  tablet 2.5 mg, 2.5 mg, Oral, Q4H PRN, Perri DELENA Meliton Mickey., MD, 2.5 mg at 08/20/24 1122   pantoprazole  (PROTONIX ) injection 40 mg, 40 mg, Intravenous, Q12H, Perri DELENA Meliton Mickey., MD, 40 mg at 08/21/24 1118   prochlorperazine  (COMPAZINE ) injection 10 mg, 10 mg, Intravenous, Q6H PRN, Howerter, Justin B, DO   propranolol  (INDERAL ) tablet 20 mg, 20 mg, Per Tube, TID, Sigdel, Santosh, MD, 20 mg at 08/21/24 1129   senna-docusate (Senokot-S) tablet 1 tablet, 1 tablet, Oral, QHS PRN, Foust, Katy L, NP   sodium chloride  flush (NS) 0.9 % injection 10-40 mL, 10-40 mL, Intracatheter, Q12H, Sigdel, Santosh, MD, 40 mL at 08/21/24 1126   sodium chloride  flush (NS) 0.9 % injection 10-40 mL, 10-40 mL, Intracatheter, PRN, Mcarthur Pick, MD  Labs and Diagnostic Imaging   CBC:  Recent Labs  Lab 08/19/24 0705 08/20/24 0500 08/21/24 0634 08/21/24 1130  WBC 6.2 6.2 4.5  --   NEUTROABS 4.1  --  3.0  --   HGB 9.4* 9.2* 8.0* 8.6*  HCT 29.5* 28.8* 25.3* 27.2*  MCV 93.7 93.5 95.8  --   PLT 224 244 182  --     Basic Metabolic Panel:  Lab Results  Component Value Date   NA 138 08/21/2024   K 4.4 08/21/2024   CO2 29 08/21/2024   GLUCOSE 199 (H) 08/21/2024   BUN 22 08/21/2024   CREATININE 0.39 (L) 08/21/2024   CALCIUM  8.1 (L) 08/21/2024   GFRNONAA >60 08/21/2024   GFRAA 94 12/16/2019   Lipid Panel:  Lab Results  Component Value Date   LDLCALC 28 05/30/2024   HgbA1c:  Lab Results  Component Value Date   HGBA1C 7.2 (H) 06/01/2024   Urine Drug Screen:     Component Value Date/Time   LABOPIA POSITIVE (A) 04/20/2024 1817   COCAINSCRNUR NONE DETECTED 04/20/2024 1817   LABBENZ NONE DETECTED 04/20/2024 1817   AMPHETMU NONE DETECTED 04/20/2024 1817   THCU NONE DETECTED 04/20/2024 1817   LABBARB NONE DETECTED 04/20/2024 1817    Alcohol Level     Component Value Date/Time   ETH <15 04/20/2024 1344   INR  Lab Results  Component Value Date   INR 1.0 08/18/2024   APTT  Lab Results   Component Value Date   APTT 67 (H) 06/21/2024   Imaging personally reviewed MRI Brain(Personally reviewed): 1. Similar evolving infarcts in the anterior and posterior circulation. 2. No new infarcts, progressive mass effect or acute hemorrhage.  Assessment   Laronica Bhagat is a 63 y.o. 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  Cervical spine imaging also unremarkable for acute process.  At this time, stiffness on her exam coupled with altered mental status is of unclear etiology. Possibilities include side effect/neurotoxicity from the PD-1 inhibitor pembrolizumab , which is being used to treat her cancer, other paraneoplastic syndrome including possibly GAD65 related stiff person syndrome. Her exam with waxy flexibility and decreased but intermittent verbalization could have been consistent with catatonia, but the time course would be unsual and lack of response to Ativan  makes this much less likely.  Steroids have been tried with no improvement and PLEX has been discussed in case this is an irAE associated with keytruda , or some other antibody drive autoimmune process.   Her CSF results do not favor acute inflammatory demyelinating  polyneuropathy/Guillain-Barr type of pathology, nor does her clinical presentation/exam.   Recommendations  Plasmapheresis to start today then QOD Paraneoplastic panel not sent, will ask to get it drawn. awaiting GAD65 ab Neurology will follow.  ______________________________________________________________________  Aisha Seals, MD [facility]   If 7pm- 7am, please page neurology on call as listed in [facility].

## 2024-08-21 NOTE — Progress Notes (Signed)
 Pt completed her 1st TPE tx---had a decrease in her Bps--in the 80s---Dr. Michaela notified---566ml iv bolus of 0.9% of normal saline given---as the pain meds wore off--pt screaming and moaning continuously--dilaudid  0.5mg  iv given--report called to the primary RN

## 2024-08-22 DIAGNOSIS — G9341 Metabolic encephalopathy: Secondary | ICD-10-CM | POA: Diagnosis not present

## 2024-08-22 LAB — COMPREHENSIVE METABOLIC PANEL WITH GFR
ALT: 64 U/L — ABNORMAL HIGH (ref 0–44)
AST: 33 U/L (ref 15–41)
Albumin: 3.4 g/dL — ABNORMAL LOW (ref 3.5–5.0)
Alkaline Phosphatase: 69 U/L (ref 38–126)
Anion gap: 4 — ABNORMAL LOW (ref 5–15)
BUN: 21 mg/dL (ref 8–23)
CO2: 27 mmol/L (ref 22–32)
Calcium: 8 mg/dL — ABNORMAL LOW (ref 8.9–10.3)
Chloride: 106 mmol/L (ref 98–111)
Creatinine, Ser: 0.39 mg/dL — ABNORMAL LOW (ref 0.44–1.00)
GFR, Estimated: >60 mL/min
Glucose, Bld: 358 mg/dL — ABNORMAL HIGH (ref 70–99)
Potassium: 4.6 mmol/L (ref 3.5–5.1)
Sodium: 136 mmol/L (ref 135–145)
Total Bilirubin: 0.3 mg/dL (ref 0.0–1.2)
Total Protein: 4.2 g/dL — ABNORMAL LOW (ref 6.5–8.1)

## 2024-08-22 LAB — CBC WITH DIFFERENTIAL/PLATELET
Abs Immature Granulocytes: 0.03 K/uL (ref 0.00–0.07)
Basophils Absolute: 0 K/uL (ref 0.0–0.1)
Basophils Relative: 0 %
Eosinophils Absolute: 0.1 K/uL (ref 0.0–0.5)
Eosinophils Relative: 2 %
HCT: 24 % — ABNORMAL LOW (ref 36.0–46.0)
Hemoglobin: 7.6 g/dL — ABNORMAL LOW (ref 12.0–15.0)
Immature Granulocytes: 1 %
Lymphocytes Relative: 19 %
Lymphs Abs: 1 K/uL (ref 0.7–4.0)
MCH: 30.3 pg (ref 26.0–34.0)
MCHC: 31.7 g/dL (ref 30.0–36.0)
MCV: 95.6 fL (ref 80.0–100.0)
Monocytes Absolute: 0.2 K/uL (ref 0.1–1.0)
Monocytes Relative: 5 %
Neutro Abs: 3.9 K/uL (ref 1.7–7.7)
Neutrophils Relative %: 73 %
Platelets: 170 K/uL (ref 150–400)
RBC: 2.51 MIL/uL — ABNORMAL LOW (ref 3.87–5.11)
RDW: 19.4 % — ABNORMAL HIGH (ref 11.5–15.5)
WBC: 5.3 K/uL (ref 4.0–10.5)
nRBC: 0 % (ref 0.0–0.2)

## 2024-08-22 LAB — MAGNESIUM: Magnesium: 1.9 mg/dL (ref 1.7–2.4)

## 2024-08-22 LAB — GLUCOSE, CAPILLARY
Glucose-Capillary: 363 mg/dL — ABNORMAL HIGH (ref 70–99)
Glucose-Capillary: 299 mg/dL — ABNORMAL HIGH (ref 70–99)
Glucose-Capillary: 298 mg/dL — ABNORMAL HIGH (ref 70–99)
Glucose-Capillary: 166 mg/dL — ABNORMAL HIGH (ref 70–99)
Glucose-Capillary: 225 mg/dL — ABNORMAL HIGH (ref 70–99)

## 2024-08-22 LAB — TYPE AND SCREEN
ABO/RH(D): O POS
Antibody Screen: NEGATIVE

## 2024-08-22 LAB — PHOSPHORUS: Phosphorus: 1.9 mg/dL — ABNORMAL LOW (ref 2.5–4.6)

## 2024-08-22 LAB — HEMOGLOBIN AND HEMATOCRIT, BLOOD
HCT: 25.3 % — ABNORMAL LOW (ref 36.0–46.0)
Hemoglobin: 8 g/dL — ABNORMAL LOW (ref 12.0–15.0)

## 2024-08-22 MED ORDER — HYDROMORPHONE HCL 1 MG/ML IJ SOLN
1.0000 mg | Freq: Once | INTRAMUSCULAR | Status: AC
  Administered 2024-08-22: 1 mg via INTRAVENOUS
  Filled 2024-08-22: qty 1

## 2024-08-22 MED ORDER — OXYCODONE HCL 5 MG PO TABS
5.0000 mg | ORAL_TABLET | ORAL | Status: DC | PRN
  Administered 2024-08-22 – 2024-09-08 (×30): 5 mg via ORAL
  Filled 2024-08-22 (×18): qty 1

## 2024-08-22 MED ORDER — INSULIN GLARGINE 100 UNIT/ML ~~LOC~~ SOLN
10.0000 [IU] | Freq: Two times a day (BID) | SUBCUTANEOUS | Status: DC
  Administered 2024-08-22 – 2024-08-23 (×3): 10 [IU] via SUBCUTANEOUS
  Filled 2024-08-22 (×4): qty 0.1

## 2024-08-22 MED ORDER — OXYCODONE HCL 5 MG PO TABS: 2.5000 mg | ORAL_TABLET | ORAL | Status: DC | PRN

## 2024-08-22 MED ORDER — HALOPERIDOL LACTATE 5 MG/ML IJ SOLN
5.0000 mg | Freq: Once | INTRAMUSCULAR | Status: AC | PRN
  Administered 2024-08-22: 5 mg via INTRAVENOUS
  Filled 2024-08-22: qty 1

## 2024-08-22 NOTE — Progress Notes (Addendum)
 " PROGRESS NOTE    Brenda [name]  FMW:[id number] DOB: 1960-11-18 DOA: 07/15/2024 PCP: [facility]  No chief complaint on file.   Brief Narrative:   63 year old female with history of metastatic endometrial cancer, chronic cancer-related pain, hypertension, hyperlipidemia, diabetes mellitus, CVA, history of DVT and PE anticoagulated on Lovenox , OSA, chronic hypoxic respiratory failure on 2L via nasal cannula and recent hospitalization and discharged on 07/14/2024 for UTI treated with IV antibiotics followed by oral antibiotics on discharge presented with chills and weakness.  She was found to have UTI and was started on IV cefepime .  Unfortunately mental status worsened and she was transferred to [facility] for continuous EEG monitoring with 4 seizures noted on video EEG. She was on Keppra  11/16. Placed on empiric broad-spectrum antibiotics for possible meningitis/encephalitis which was ruled out based on CSF results. .    Neurology has been reconsulted 12/7 with development of rigidity.  Oncology has been reconsulted.  Family has begun to discuss palliative care, but at this point managing her cancer related pain and working on rigidity with neurology.  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  I had Brenda [name] conversation with them  about comfort focused care and hospice, but at this point, we're trying to work up and treat the acute issues below (with regards to her rigidity and encephalopathy).  At this point in time, husband and the family are hoping to trial PLEX.  Will do our best to keep her as comfortable as possible as we pursue this treatment option with neurology assistance.  Per palliative, husband asking about transfer if she's not improved with this, will address as needed after plex.  Rigidity  Seizures Note 11/15 she had stiffening of upper extremities and was transferred to [facility] for Continuous EEG. 4 seizures noted 11/16 S/p LP, meningitis ruled out Her husband notes worsening stiffness/rigidity/inability to move extremities starting about 1 week ago  EEG without epileptiform abnormalities head CT without acute intracranial abnormality MRI brain with evolving infarcts - MRI C spine without evidence of metastatic disease Continue keppra   appreciate neurology recommendations - ddx includes PD-1 inhibitor induced side effect, paraneoplatic syndrome, stiff person syndrome.  Considering catatonia.  We've stopped prozac  (low dose, lower suspicion for serotonin syndrome?).  S/p high dose steroids 12/9-11.  S/p trial sinimet.  S/p baclofen .  Follow paraneoplastic panel, GAD65 ab.  S/p PLEX #1 on 12/19.  Acute Metabolic Encephalopathy Sounds like suspicion that this and seizures above related to cefepime  Now sedated in setting of pain meds/anxiety Delirium precautions  issues with somnolence on fentanyl , will balance need for opiates with sedation   Anemia Bright Red Blood Per Rectum BRBPR resolved at this time - anticoagulation on hold  CT GI bleed study without evidence GI bleed IV PPI BID S/p 1 unit pRBC 12/1 -  S/p IV iron   Normal folate, b12.  Normal iron , ferritin.  Noted downtrend, repeat  Klebsiella Pneumoniae UTI Leukocytosis Pyuria Afebrile, but several temps around 99 (up to 100.2 on  12/18) Leukocytosis has resolved treating with abx for UTI - s/p 5 day course Blood cultures NGx2  Metastatic Endometrial Cancer Cancer Related Pain  Appreciate oncology consult  CT chest/abdomen/pelvis for persistent (worsening?) back pain (note centrally necrotic infiltrative mass in L psoas in 06/2024) CT 12/7 with R psoas muscle mass like enlargement measuring 5.0 x 4.6 cm indeterminate for tumor vs hematoma  - L psoas/paraspinal mass decreased in size.   CT 12/16 with R psoas muscle hematoma (discussed with IR 12/19, who noted nothing to drain), L2 sclerotic changes with L paraspinal masslike tissue at L2 concerning for neoplastic process Appreciate oncology needs - last saw 12/18 Will continue to adjust pain meds  Elevated LFT's Monitor - relatively mild, will trend for now  Dysphagia Cortrak + tube feeds - eventually decision regarding PEG will need to be made, but will hold off for now with our trial of plex  Depression Anxiety Prozac  discontinued with rigidity  Pulmonary Embolism Splenic Infarct Lovenox  on hold with GI bleed and psoas hematoma above  CAD  Hx Stroke Lovenox  on hold  Hypernatremia Resolved  Abnormal Thyroid  Function Tests Repeat TSH and free T4 wnl Negative TSI On propanolol   T2DM Lantus , SSI - adjust as needed   Pressure Ulcer Wound 07/25/24 1100 Pressure Injury Sacrum Medial Deep Tissue Pressure Injury - Purple or maroon localized area of discolored intact skin or blood-filled blister due to damage of underlying soft tissue from pressure and/or shear. (Active)     Wound 08/11/24 0415 Pressure Injury Vertebral column Medial Stage 2 -  Partial thickness loss of dermis presenting as Brenda [name] shallow open injury with Brenda [name] red, pink wound bed without slough. (Active)   Obesity Body mass index is 31.83 kg/m.     DVT prophylaxis: lovenox  on hold with bleeding  Code Status: DNR Family Communication: husband 12/20  Disposition:   Status is:  Inpatient Remains inpatient appropriate because: need for continued inpatient care   Consultants:  Neurology Palliative care oncology  Procedures:  11/21 LP  EEG   Antimicrobials:  Anti-infectives (From admission, onward)    Start     Dose/Rate Route Frequency Ordered Stop   08/16/24 1100  cefTRIAXone  (ROCEPHIN ) 2 g in sodium chloride  0.9 % 100 mL IVPB        2 g 200 mL/hr over 30 Minutes Intravenous Every 24 hours 08/16/24 1001 08/20/24 1214   07/23/24 0600  vancomycin  (VANCOCIN ) IVPB 1000 mg/200 mL premix  Status:  Discontinued        1,000 mg 200 mL/hr over 60 Minutes Intravenous Every 12 hours 07/22/24 1834 07/24/24 1808   07/22/24 2200  meropenem  (MERREM ) 2 g in sodium chloride  0.9 % 100 mL IVPB  Status:  Discontinued        2 g 280 mL/hr over 30 Minutes Intravenous Every 8 hours 07/22/24 1601 07/24/24 1808   07/22/24 1700  vancomycin  (VANCOREADY) IVPB 1500 mg/300 mL        1,500 mg 150 mL/hr over 120 Minutes Intravenous  Once 07/22/24 1601 07/22/24 2031   07/22/24 1645  ampicillin  (OMNIPEN) 2 g in sodium chloride  0.9 % 100 mL IVPB  Status:  Discontinued        2 g 300 mL/hr over 20 Minutes Intravenous Every 6 hours 07/22/24 1557 07/24/24 1800   07/19/24  0530  meropenem  (MERREM ) 1 g in sodium chloride  0.9 % 100 mL IVPB  Status:  Discontinued        1 g 200 mL/hr over 30 Minutes Intravenous Every 8 hours 07/19/24 0438 07/22/24 1601   07/16/24 0900  ceFEPIme  (MAXIPIME ) 2 g in sodium chloride  0.9 % 100 mL IVPB  Status:  Discontinued        2 g 200 mL/hr over 30 Minutes Intravenous Every 8 hours 07/16/24 0811 07/19/24 0409   07/16/24 0815  ceFEPIme  (MAXIPIME ) 2 g in sodium chloride  0.9 % 100 mL IVPB  Status:  Discontinued        2 g 200 mL/hr over 30 Minutes Intravenous  Once 07/16/24 0803 07/16/24 0811   07/16/24 0330  cefTRIAXone  (ROCEPHIN ) 2 g in sodium chloride  0.9 % 100 mL IVPB        2 g 200 mL/hr over 30 Minutes Intravenous  Once 07/16/24 0315 07/16/24 0412        Subjective:  Husband at bedside  Objective: Vitals:   08/21/24 2341 08/22/24 0340 08/22/24 0735 08/22/24 1157  BP: (!) 105/55 (!) 115/41 (!) 101/47 (!) 131/58  Pulse: (!) 55 66 (!) 55 66  Resp:   14 16  Temp: 97.8 F (36.6 C) 97.9 F (36.6 C) 98.5 F (36.9 C) 98.4 F (36.9 C)  TempSrc: Oral Oral Oral Oral  SpO2: 100% 100% 100% 100%  Weight:      Height:        Intake/Output Summary (Last 24 hours) at 08/22/2024 1502 Last data filed at 08/22/2024 1222 Gross per 24 hour  Intake 398.5 ml  Output 300 ml  Net 98.5 ml   Filed Weights   08/11/24 0500 08/12/24 0345 08/21/24 0800  Weight: 84.4 kg 84.4 kg 78.9 kg    Examination:  General: chronically ill appearing Cardiovascular: RRR Lungs: unlabored Abdomen: Soft, nontender, nondistended Neurological: sedated, rigid - wakes up briefly moaning Extremities: No clubbing or cyanosis. No edema.   Data Reviewed: I have personally reviewed following labs and imaging studies  CBC: Recent Labs  Lab 08/17/24 0702 08/18/24 0614 08/18/24 1223 08/19/24 0705 08/20/24 0500 08/21/24 0634 08/21/24 1130 08/22/24 0500  WBC 9.5 9.5  --  6.2 6.2 4.5  --  5.3  NEUTROABS 7.5 7.5  --  4.1  --  3.0  --  3.9  HGB 10.4* 10.2*   < > 9.4* 9.2* 8.0* 8.6* 7.6*  HCT 32.1* 31.7*   < > 29.5* 28.8* 25.3* 27.2* 24.0*  MCV 91.7 93.0  --  93.7 93.5 95.8  --  95.6  PLT 287 260  --  224 244 182  --  170   < > = values in this interval not displayed.    Basic Metabolic Panel: Recent Labs  Lab 08/18/24 0614 08/19/24 0705 08/20/24 0500 08/21/24 0634 08/22/24 0500  NA 139 140 137 138 136  K 4.1 4.2 4.4 4.4 4.6  CL 105 106 103 104 106  CO2 30 28 27 29 27   GLUCOSE 183* 94 258* 199* 358*  BUN 19 22 25* 22 21  CREATININE 0.37* 0.35* 0.42* 0.39* 0.39*  CALCIUM  8.2* 8.6* 8.5* 8.1* 8.0*  MG 2.0 2.1 2.1 2.1 1.9  PHOS 3.4 4.2 3.0 2.7 1.9*    GFR: Estimated Creatinine Clearance: 71.8 mL/min (Martha Ellerby) (by C-G formula based on SCr of 0.39 mg/dL  (L)).  Liver Function Tests: Recent Labs  Lab 08/17/24 [phone] 08/18/24 9385 08/19/24 0705 08/21/24 0634 08/22/24 0500  AST  45* 38 40 55* 33  ALT 118* 102* 104* 167* 64*  ALKPHOS 143* 127* 146* 178* 69  BILITOT 0.6 0.6 0.4 0.2 0.3  PROT 5.6* 5.7* 5.4* 4.9* 4.2*  ALBUMIN  2.3* 2.4* 2.8* 2.8* 3.4*    CBG: Recent Labs  Lab 08/21/24 2000 08/21/24 2338 08/22/24 0337 08/22/24 0736 08/22/24 1158  GLUCAP 207* 244* 363* 299* 298*     Recent Results (from the past 240 hours)  Culture, Urine (Do not remove urinary catheter, catheter placed by urology or difficult to place)     Status: Abnormal   Collection Time: 08/15/24 10:34 AM   Specimen: Urine, Catheterized  Result Value Ref Range Status   Specimen Description URINE, CATHETERIZED  Final   Special Requests   Final    NONE Performed at [facility], [address] [place], [place] [address]    Culture >=100,000 COLONIES/mL KLEBSIELLA PNEUMONIAE (Casmere Hollenbeck)  Final   Report Status 08/17/2024 FINAL  Final   Organism ID, Bacteria KLEBSIELLA PNEUMONIAE (Akshaya Toepfer)  Final      Susceptibility   Klebsiella pneumoniae - MIC*    AMPICILLIN  >=32 RESISTANT Resistant     CEFAZOLIN  (URINE) Value in next row Resistant      RESISTANTThis is Sruti Ayllon modified FDA-approved test that has been validated and its performance characteristics determined by the reporting laboratory.  This laboratory is certified under the Clinical Laboratory Improvement Amendments CLIA as qualified to perform high complexity clinical laboratory testing.    CEFEPIME  Value in next row Sensitive      RESISTANTThis is Torey Reinard modified FDA-approved test that has been validated and its performance characteristics determined by the reporting laboratory.  This laboratory is certified under the Clinical Laboratory Improvement Amendments CLIA as qualified to perform high complexity clinical laboratory testing.    ERTAPENEM Value in next row Sensitive      RESISTANTThis is Fisher Hargadon modified FDA-approved test  that has been validated and its performance characteristics determined by the reporting laboratory.  This laboratory is certified under the Clinical Laboratory Improvement Amendments CLIA as qualified to perform high complexity clinical laboratory testing.    CEFTRIAXONE  Value in next row Sensitive      RESISTANTThis is Fitz Matsuo modified FDA-approved test that has been validated and its performance characteristics determined by the reporting laboratory.  This laboratory is certified under the Clinical Laboratory Improvement Amendments CLIA as qualified to perform high complexity clinical laboratory testing.    CIPROFLOXACIN  Value in next row Sensitive      RESISTANTThis is Chyane Greer modified FDA-approved test that has been validated and its performance characteristics determined by the reporting laboratory.  This laboratory is certified under the Clinical Laboratory Improvement Amendments CLIA as qualified to perform high complexity clinical laboratory testing.    GENTAMICIN Value in next row Sensitive      RESISTANTThis is Calden Dorsey modified FDA-approved test that has been validated and its performance characteristics determined by the reporting laboratory.  This laboratory is certified under the Clinical Laboratory Improvement Amendments CLIA as qualified to perform high complexity clinical laboratory testing.    NITROFURANTOIN Value in next row Intermediate      RESISTANTThis is Tabor Bartram modified FDA-approved test that has been validated and its performance characteristics determined by the reporting laboratory.  This laboratory is certified under the Clinical Laboratory Improvement Amendments CLIA as qualified to perform high complexity clinical laboratory testing.    TRIMETH /SULFA  Value in next row Sensitive      RESISTANTThis is Aleigh Grunden modified FDA-approved test that has been  validated and its performance characteristics determined by the reporting laboratory.  This laboratory is certified under the Clinical Laboratory Improvement  Amendments CLIA as qualified to perform high complexity clinical laboratory testing.    AMPICILLIN /SULBACTAM Value in next row Resistant      RESISTANTThis is Gotham Raden modified FDA-approved test that has been validated and its performance characteristics determined by the reporting laboratory.  This laboratory is certified under the Clinical Laboratory Improvement Amendments CLIA as qualified to perform high complexity clinical laboratory testing.    PIP/TAZO Value in next row Sensitive      16 SENSITIVEThis is Jarren Para modified FDA-approved test that has been validated and its performance characteristics determined by the reporting laboratory.  This laboratory is certified under the Clinical Laboratory Improvement Amendments CLIA as qualified to perform high complexity clinical laboratory testing.    MEROPENEM  Value in next row Sensitive      16 SENSITIVEThis is Ahna Konkle modified FDA-approved test that has been validated and its performance characteristics determined by the reporting laboratory.  This laboratory is certified under the Clinical Laboratory Improvement Amendments CLIA as qualified to perform high complexity clinical laboratory testing.    * >=100,000 COLONIES/mL KLEBSIELLA PNEUMONIAE  Culture, blood (Routine X 2) w Reflex to ID Panel     Status: None   Collection Time: 08/15/24 12:41 PM   Specimen: BLOOD RIGHT HAND  Result Value Ref Range Status   Specimen Description BLOOD RIGHT HAND  Final   Special Requests   Final    BOTTLES DRAWN AEROBIC AND ANAEROBIC Blood Culture adequate volume   Culture   Final    NO GROWTH 5 DAYS Performed at [facility], [address], [place], [place] [address]    Report Status 08/20/2024 FINAL  Final  Culture, blood (Routine X 2) w Reflex to ID Panel     Status: None   Collection Time: 08/15/24 12:41 PM   Specimen: BLOOD LEFT HAND  Result Value Ref Range Status   Specimen Description BLOOD LEFT HAND  Final   Special Requests   Final    BOTTLES DRAWN AEROBIC  AND ANAEROBIC Blood Culture adequate volume   Culture   Final    NO GROWTH 5 DAYS Performed at [facility], [address], [place], [place] [address]    Report Status 08/20/2024 FINAL  Final         Radiology Studies: No results found.         Scheduled Meds:  amLODipine   10 mg Per Tube Daily   atorvastatin   80 mg Per Tube QHS   calcium  gluconate  2 g Intravenous Once   Chlorhexidine  Gluconate Cloth  6 each Topical Daily   free water   75 mL Per Tube Q4H   gabapentin   100 mg Per Tube Q8H   insulin  aspart  0-9 Units Subcutaneous Q4H   insulin  glargine  10 Units Subcutaneous BID   levETIRAcetam   500 mg Intravenous Q12H   lidocaine   1 patch Transdermal Daily   mouth rinse  15 mL Mouth Rinse 4 times per day   oxyCODONE   2.5 mg Per Tube Q8H   pantoprazole  (PROTONIX ) IV  40 mg Intravenous Q12H   propranolol   20 mg Per Tube TID   sodium chloride  flush  10-40 mL Intracatheter Q12H   Continuous Infusions:  anticoagulant sodium citrate      citrate dextrose      feeding supplement (GLUCERNA 1.5 CAL) 45 mL/hr at 08/21/24 1834     LOS: 36  days    Time spent: over 30 min    Meliton Monte, MD [facility]   To contact the attending provider between 7A-7P or the covering provider during after hours 7P-7A, please log into the web site [email or web address] and access using universal [place] password for that web site. If you do not have the password, please call the hospital operator.  08/22/2024, 3:02 PM    "

## 2024-08-22 NOTE — Progress Notes (Signed)
 Spoke with this pts primary RN--Robert Graves RN---made arrangements to have this pt weighed and to have a CBC and a BMP drawn  in the am by 0500 for TPE due in the am

## 2024-08-22 NOTE — Progress Notes (Signed)
 [facility] night cross cover note:   I was notified by the patient's RN that the patient remains agitated, continues to yell out, complaining of discomfort in spite of 5 mg of oxycodone , 0.5 mg of IV Dilaudid  as well as 1 mg of IV Ativan .  Her most recent QTc was noted to be 390 MS.  I subsequently placed an order for Dilaudid  1 mg IV x 1 dose now as well as an order for a one-time prn dose of IV Haldol   for agitation.     Eva Pore, DO Hospitalist

## 2024-08-22 NOTE — Plan of Care (Signed)

## 2024-08-23 DIAGNOSIS — T45AX4A Poisoning by immune checkpoint inhibitors and immunostimulant drugs, undetermined, initial encounter: Secondary | ICD-10-CM | POA: Diagnosis not present

## 2024-08-23 DIAGNOSIS — T45AX5A Adverse effect of immune checkpoint inhibitors and immunostimulant drugs, initial encounter: Secondary | ICD-10-CM | POA: Diagnosis not present

## 2024-08-23 DIAGNOSIS — R4182 Altered mental status, unspecified: Secondary | ICD-10-CM | POA: Diagnosis not present

## 2024-08-23 DIAGNOSIS — G9341 Metabolic encephalopathy: Secondary | ICD-10-CM | POA: Diagnosis not present

## 2024-08-23 LAB — CBC WITH DIFFERENTIAL/PLATELET
Abs Immature Granulocytes: 0.02 K/uL (ref 0.00–0.07)
Basophils Absolute: 0 K/uL (ref 0.0–0.1)
Basophils Relative: 0 %
Eosinophils Absolute: 0.1 K/uL (ref 0.0–0.5)
Eosinophils Relative: 2 %
HCT: 25.3 % — ABNORMAL LOW (ref 36.0–46.0)
Hemoglobin: 8 g/dL — ABNORMAL LOW (ref 12.0–15.0)
Immature Granulocytes: 0 %
Lymphocytes Relative: 26 %
Lymphs Abs: 1.3 K/uL (ref 0.7–4.0)
MCH: 30 pg (ref 26.0–34.0)
MCHC: 31.6 g/dL (ref 30.0–36.0)
MCV: 94.8 fL (ref 80.0–100.0)
Monocytes Absolute: 0.3 K/uL (ref 0.1–1.0)
Monocytes Relative: 6 %
Neutro Abs: 3.3 K/uL (ref 1.7–7.7)
Neutrophils Relative %: 66 %
Platelets: 191 K/uL (ref 150–400)
RBC: 2.67 MIL/uL — ABNORMAL LOW (ref 3.87–5.11)
RDW: 19.9 % — ABNORMAL HIGH (ref 11.5–15.5)
WBC: 5.2 K/uL (ref 4.0–10.5)
nRBC: 0 % (ref 0.0–0.2)

## 2024-08-23 LAB — GLUCOSE, CAPILLARY
Glucose-Capillary: 163 mg/dL — ABNORMAL HIGH (ref 70–99)
Glucose-Capillary: 196 mg/dL — ABNORMAL HIGH (ref 70–99)
Glucose-Capillary: 204 mg/dL — ABNORMAL HIGH (ref 70–99)
Glucose-Capillary: 210 mg/dL — ABNORMAL HIGH (ref 70–99)
Glucose-Capillary: 212 mg/dL — ABNORMAL HIGH (ref 70–99)

## 2024-08-23 LAB — COMPREHENSIVE METABOLIC PANEL WITH GFR
ALT: 72 U/L — ABNORMAL HIGH (ref 0–44)
AST: 41 U/L (ref 15–41)
Albumin: 3.2 g/dL — ABNORMAL LOW (ref 3.5–5.0)
Alkaline Phosphatase: 83 U/L (ref 38–126)
Anion gap: 4 — ABNORMAL LOW (ref 5–15)
BUN: 20 mg/dL (ref 8–23)
CO2: 28 mmol/L (ref 22–32)
Calcium: 8.1 mg/dL — ABNORMAL LOW (ref 8.9–10.3)
Chloride: 104 mmol/L (ref 98–111)
Creatinine, Ser: 0.35 mg/dL — ABNORMAL LOW (ref 0.44–1.00)
GFR, Estimated: >60 mL/min
Glucose, Bld: 229 mg/dL — ABNORMAL HIGH (ref 70–99)
Potassium: 4.4 mmol/L (ref 3.5–5.1)
Sodium: 136 mmol/L (ref 135–145)
Total Bilirubin: 0.3 mg/dL (ref 0.0–1.2)
Total Protein: 4.5 g/dL — ABNORMAL LOW (ref 6.5–8.1)

## 2024-08-23 LAB — MAGNESIUM: Magnesium: 1.9 mg/dL (ref 1.7–2.4)

## 2024-08-23 LAB — PHOSPHORUS: Phosphorus: 2.1 mg/dL — ABNORMAL LOW (ref 2.5–4.6)

## 2024-08-23 MED ORDER — HALOPERIDOL LACTATE 5 MG/ML IJ SOLN
5.0000 mg | Freq: Once | INTRAMUSCULAR | Status: AC
  Administered 2024-08-23: 5 mg via INTRAVENOUS
  Filled 2024-08-23: qty 1

## 2024-08-23 MED ORDER — ACETAMINOPHEN 325 MG PO TABS: 650.0000 mg | ORAL_TABLET | ORAL | Status: DC | PRN

## 2024-08-23 MED ORDER — SODIUM CHLORIDE 0.9 % IV SOLN
INTRAVENOUS | Status: AC
  Filled 2024-08-23 (×3): qty 200

## 2024-08-23 MED ORDER — CALCIUM CARBONATE ANTACID 500 MG PO CHEW
2.0000 | CHEWABLE_TABLET | ORAL | Status: AC
  Administered 2024-08-23 (×2): 400 mg via ORAL
  Filled 2024-08-23 (×2): qty 2

## 2024-08-23 MED ORDER — DIPHENHYDRAMINE HCL 25 MG PO CAPS
25.0000 mg | ORAL_CAPSULE | Freq: Four times a day (QID) | ORAL | Status: DC | PRN
  Administered 2024-08-23 – 2024-08-27 (×3): 25 mg via ORAL
  Filled 2024-08-23 (×3): qty 1

## 2024-08-23 MED ORDER — ANTICOAGULANT SODIUM CITRATE 4% (200MG/5ML) IV SOLN
5.0000 mL | Freq: Once | Status: AC
  Administered 2024-08-23: 5 mL
  Filled 2024-08-23: qty 5

## 2024-08-23 MED ORDER — POTASSIUM PHOSPHATES 15 MMOLE/5ML IV SOLN
30.0000 mmol | Freq: Once | INTRAVENOUS | Status: AC
  Administered 2024-08-23: 30 mmol via INTRAVENOUS
  Filled 2024-08-23: qty 10

## 2024-08-23 MED ORDER — SODIUM CHLORIDE 0.9 % IV SOLN
Freq: Once | INTRAVENOUS | Status: DC
  Filled 2024-08-23: qty 200

## 2024-08-23 MED ORDER — INSULIN GLARGINE 100 UNIT/ML ~~LOC~~ SOLN
15.0000 [IU] | Freq: Two times a day (BID) | SUBCUTANEOUS | Status: AC
  Administered 2024-08-23 – 2024-09-06 (×29): 15 [IU] via SUBCUTANEOUS
  Filled 2024-08-23 (×16): qty 0.15

## 2024-08-23 MED ORDER — ACD FORMULA A 0.73-2.45-2.2 GM/100ML VI SOLN
1000.0000 mL | Status: DC
  Administered 2024-08-23: 1000 mL
  Filled 2024-08-23 (×3): qty 1000

## 2024-08-23 MED ORDER — CALCIUM GLUCONATE-NACL 2-0.675 GM/100ML-% IV SOLN
INTRAVENOUS | Status: AC
  Filled 2024-08-23: qty 100

## 2024-08-23 MED ORDER — CALCIUM GLUCONATE-NACL 2-0.675 GM/100ML-% IV SOLN
2.0000 g | Freq: Once | INTRAVENOUS | Status: AC
  Administered 2024-08-23: 2000 mg via INTRAVENOUS
  Filled 2024-08-23: qty 100

## 2024-08-23 NOTE — Plan of Care (Signed)
  Problem: Education: Goal: Ability to describe self-care measures that may prevent or decrease complications (Diabetes Survival Skills Education) will improve Outcome: Not Progressing   Problem: Coping: Goal: Ability to adjust to condition or change in health will improve Outcome: Not Progressing   Problem: Education: Goal: Knowledge of General Education information will improve Description: Including pain rating scale, medication(s)/side effects and non-pharmacologic comfort measures Outcome: Not Progressing

## 2024-08-23 NOTE — Progress Notes (Signed)
 " PROGRESS NOTE    Brenda [name]  FMW:[id number] DOB: 1961-02-04 DOA: 07/15/2024 PCP: [facility]   Brief Narrative:  63 year old female with history of metastatic endometrial cancer, chronic cancer-related pain, hypertension, hyperlipidemia, diabetes mellitus, CVA, history of DVT and PE anticoagulated on Lovenox , OSA, chronic hypoxic respiratory failure on 2L via nasal cannula and recent hospitalization and discharged on 07/14/2024 for UTI treated with IV antibiotics followed by oral antibiotics on discharge presented with chills and weakness.  She was found to have UTI and was started on IV cefepime .  Unfortunately mental status worsened and she was transferred to [facility] for continuous EEG monitoring with 4 seizures noted on video EEG. She was on Keppra  11/16. Placed on empiric broad-spectrum antibiotics for possible meningitis/encephalitis which was ruled out based on CSF results. .    Neurology has been reconsulted 12/7 with development of rigidity.  Oncology has been reconsulted.  Family has begun to discuss palliative care, but at this point managing her cancer related pain and working on rigidity with neurology.  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 4 seizures noted 11/16. S/p LP, meningitis ruled out Her  husband notes worsening stiffness/rigidity/inability to move extremities starting about 1 week ago. EEG without epileptiform abnormalities. head CT without acute intracranial abnormality. MRI brain with evolving infarcts - MRI C spine without evidence of metastatic disease.  Remains on Keppra .  Neurology managing. appreciate neurology recommendations - ddx includes PD-1 inhibitor induced side effect, paraneoplatic syndrome, stiff person syndrome.  Considering catatonia.  We've stopped prozac  (low dose, lower suspicion for serotonin syndrome?).  S/p high dose steroids 12/9-11.  S/p trial sinimet.  S/p baclofen .  Follow paraneoplastic panel, GAD65 ab.  S/p PLEX #1 on 12/19.   Acute Metabolic Encephalopathy Now sedated in setting of pain meds/anxiety Delirium precautions  issues with somnolence on fentanyl , will balance need for opiates with sedation    Anemia / Bright Red Blood Per Rectum BRBPR resolved at this time - anticoagulation on hold  CT GI bleed study without evidence GI bleed. IV PPI BID S/p 1 unit pRBC 12/1 - S/p IV iron . Normal folate, b12.  Normal iron , ferritin.  Hemoglobin fairly stable.   Klebsiella Pneumoniae UTI Treated with abx for UTI - s/p 5 day course Blood cultures NGx2   Metastatic Endometrial Cancer / Cancer Related Pain  Appreciate oncology consult  CT chest/abdomen/pelvis for persistent (worsening?) back pain (note centrally necrotic infiltrative mass in L psoas in 06/2024) CT 12/7 with R psoas muscle mass like enlargement measuring 5.0 x 4.6 cm indeterminate for tumor vs hematoma  - L psoas/paraspinal mass decreased in size.   CT 12/16 with R psoas muscle hematoma (discussed with IR 12/19, who noted nothing to drain), L2 sclerotic changes with L paraspinal masslike tissue at L2 concerning for neoplastic process  Appreciate oncology needs - last saw 12/18 Will continue to adjust pain meds   Elevated LFT's Monitor - relatively mild, will trend for now    Dysphagia Cortrak + tube feeds - eventually decision regarding PEG will need to be made, but will hold off for now with our trial of plex   Depression Anxiety Prozac  discontinued with rigidity   Pulmonary Embolism Splenic Infarct Lovenox  on hold with GI bleed and psoas hematoma above   CAD  Hx Stroke Lovenox  on hold   Hypernatremia Resolved   Abnormal Thyroid  Function Tests Repeat TSH and free T4 wnl Negative TSI On propanolol    T2DM Currently on Lantus  10 units twice daily and SSI, still hyperglycemic, will increase Lantus  to 15 units.  Goals of Care Mrs. Wiswell and her husband beginning to talk about palliative care on the day I met them (12/7).  I had a conversation with them about comfort focused care and hospice, but at this point, we're trying to work up and treat the acute issues below (with regards to her rigidity and encephalopathy).  At this point in time, husband and the family are hoping to trial PLEX.  Will do our best to keep her as comfortable as possible as we pursue this treatment option with neurology assistance.  Per palliative, husband asking about transfer if she's not improved with this but not sure if he says that when he is emotional or not.  Will address once 5 days of Plex is completed.  DVT prophylaxis: Place and maintain sequential compression device Start: 08/02/24 0738   Code Status: Limited: Do not attempt resuscitation (DNR) -DNR-LIMITED -Do Not Intubate/DNI   Family Communication: None in law present at bedside.   Status is: Inpatient Remains inpatient appropriate because: Encephalopathy   Estimated body mass index is 35.67 kg/m as calculated from the following:   Height as of this encounter: 5' 2 (1.575 m).   Weight as of this encounter: 88.5 kg.  Wound 07/25/24 1100 Pressure Injury Sacrum Medial Deep Tissue Pressure Injury - Purple or maroon localized area of discolored intact skin or blood-filled blister due to damage of underlying  soft tissue from pressure and/or shear. (Active)     Wound 08/11/24 0415 Pressure Injury Vertebral column Medial Stage 2 -  Partial thickness loss of dermis presenting as a shallow open injury with a red, pink wound bed without slough. (Active)   Nutritional Assessment: Body mass index is 35.67 kg/m.SABRA Seen by dietician.  I agree with the assessment and plan as outlined below: Nutrition Status: Nutrition Problem: Moderate Malnutrition Etiology: chronic illness (cancer) Signs/Symptoms: percent weight loss, mild muscle depletion, moderate muscle depletion (12% x 6 months) Percent weight loss: 12 % Interventions: Prostat, Tube feeding, MVI  . Skin Assessment: I have examined the patient's skin and I agree with the wound assessment as performed by the wound care RN as outlined below: Wound 07/25/24 1100 Pressure Injury Sacrum Medial Deep Tissue Pressure Injury - Purple or maroon localized area of discolored intact skin or blood-filled blister due to damage of underlying soft tissue from pressure and/or shear. (Active)     Wound 08/11/24 0415 Pressure Injury Vertebral column Medial Stage 2 -  Partial thickness loss of dermis presenting as a shallow open injury with a red, pink wound bed without slough. (Active)    Consultants:  Neurology, palliative care  Procedures:  As above  Antimicrobials:  Anti-infectives (From admission, onward)    Start     Dose/Rate Route Frequency Ordered  Stop   08/16/24 1100  cefTRIAXone  (ROCEPHIN ) 2 g in sodium chloride  0.9 % 100 mL IVPB        2 g 200 mL/hr over 30 Minutes Intravenous Every 24 hours 08/16/24 1001 08/20/24 1214   07/23/24 0600  vancomycin  (VANCOCIN ) IVPB 1000 mg/200 mL premix  Status:  Discontinued        1,000 mg 200 mL/hr over 60 Minutes Intravenous Every 12 hours 07/22/24 1834 07/24/24 1808   07/22/24 2200  meropenem  (MERREM ) 2 g in sodium chloride  0.9 % 100 mL IVPB  Status:  Discontinued        2 g 280 mL/hr over 30 Minutes  Intravenous Every 8 hours 07/22/24 1601 07/24/24 1808   07/22/24 1700  vancomycin  (VANCOREADY) IVPB 1500 mg/300 mL        1,500 mg 150 mL/hr over 120 Minutes Intravenous  Once 07/22/24 1601 07/22/24 2031   07/22/24 1645  ampicillin  (OMNIPEN) 2 g in sodium chloride  0.9 % 100 mL IVPB  Status:  Discontinued        2 g 300 mL/hr over 20 Minutes Intravenous Every 6 hours 07/22/24 1557 07/24/24 1800   07/19/24 0530  meropenem  (MERREM ) 1 g in sodium chloride  0.9 % 100 mL IVPB  Status:  Discontinued        1 g 200 mL/hr over 30 Minutes Intravenous Every 8 hours 07/19/24 0438 07/22/24 1601   07/16/24 0900  ceFEPIme  (MAXIPIME ) 2 g in sodium chloride  0.9 % 100 mL IVPB  Status:  Discontinued        2 g 200 mL/hr over 30 Minutes Intravenous Every 8 hours 07/16/24 0811 07/19/24 0409   07/16/24 0815  ceFEPIme  (MAXIPIME ) 2 g in sodium chloride  0.9 % 100 mL IVPB  Status:  Discontinued        2 g 200 mL/hr over 30 Minutes Intravenous  Once 07/16/24 0803 07/16/24 0811   07/16/24 0330  cefTRIAXone  (ROCEPHIN ) 2 g in sodium chloride  0.9 % 100 mL IVPB        2 g 200 mL/hr over 30 Minutes Intravenous  Once 07/16/24 0315 07/16/24 0412         Subjective: Patient seen and examined, son-in-law at the bedside.  Patient obtunded, wakes up with tactile stimuli, tries to speak but unable to complete a full sentence.  Keeps her eyes closed.  Likely secondary to receiving extra pain medications just few hours ago.  Per son-in-law, she was more awake before getting the medications.  Objective: Vitals:   08/22/24 1605 08/22/24 2140 08/23/24 0351 08/23/24 0823  BP: (!) 133/59 (!) 115/58  (!) 131/47  Pulse: 73 62  77  Resp: 16   18  Temp: 98.4 F (36.9 C) 98.1 F (36.7 C)  98.5 F (36.9 C)  TempSrc: Oral Axillary  Oral  SpO2: 100% 100%  100%  Weight:   88.5 kg   Height:        Intake/Output Summary (Last 24 hours) at 08/23/2024 0918 Last data filed at 08/23/2024 9173 Gross per 24 hour  Intake 490 ml   Output 400 ml  Net 90 ml   Filed Weights   08/12/24 0345 08/21/24 0800 08/23/24 0351  Weight: 84.4 kg 78.9 kg 88.5 kg    Examination:  General exam: Appears calm and comfortable but obtunded, obese Respiratory system: Clear to auscultation. Respiratory effort normal. Cardiovascular system: S1 & S2 heard, RRR. No JVD, murmurs, rubs, gallops or clicks. No pedal edema. Gastrointestinal system: Abdomen is nondistended, soft and  nontender. No organomegaly or masses felt. Normal bowel sounds heard. Central nervous system: Obtunded.  Data Reviewed: I have personally reviewed following labs and imaging studies  CBC: Recent Labs  Lab 08/18/24 0614 08/18/24 1223 08/19/24 0705 08/20/24 0500 08/21/24 0634 08/21/24 1130 08/22/24 0500 08/22/24 1618 08/23/24 0350  WBC 9.5  --  6.2 6.2 4.5  --  5.3  --  5.2  NEUTROABS 7.5  --  4.1  --  3.0  --  3.9  --  3.3  HGB 10.2*   < > 9.4* 9.2* 8.0* 8.6* 7.6* 8.0* 8.0*  HCT 31.7*   < > 29.5* 28.8* 25.3* 27.2* 24.0* 25.3* 25.3*  MCV 93.0  --  93.7 93.5 95.8  --  95.6  --  94.8  PLT 260  --  224 244 182  --  170  --  191   < > = values in this interval not displayed.   Basic Metabolic Panel: Recent Labs  Lab 08/19/24 0705 08/20/24 0500 08/21/24 0634 08/22/24 0500 08/23/24 0350  NA 140 137 138 136 136  K 4.2 4.4 4.4 4.6 4.4  CL 106 103 104 106 104  CO2 28 27 29 27 28   GLUCOSE 94 258* 199* 358* 229*  BUN 22 25* 22 21 20   CREATININE 0.35* 0.42* 0.39* 0.39* 0.35*  CALCIUM  8.6* 8.5* 8.1* 8.0* 8.1*  MG 2.1 2.1 2.1 1.9 1.9  PHOS 4.2 3.0 2.7 1.9* 2.1*   GFR: Estimated Creatinine Clearance: 76.4 mL/min (A) (by C-G formula based on SCr of 0.35 mg/dL (L)). Liver Function Tests: Recent Labs  Lab 08/18/24 0614 08/19/24 0705 08/21/24 0634 08/22/24 0500 08/23/24 0350  AST 38 40 55* 33 41  ALT 102* 104* 167* 64* 72*  ALKPHOS 127* 146* 178* 69 83  BILITOT 0.6 0.4 0.2 0.3 0.3  PROT 5.7* 5.4* 4.9* 4.2* 4.5*  ALBUMIN  2.4* 2.8* 2.8* 3.4* 3.2*    No results for input(s): LIPASE, AMYLASE in the last 168 hours. No results for input(s): AMMONIA in the last 168 hours. Coagulation Profile: Recent Labs  Lab 08/18/24 1223  INR 1.0   Cardiac Enzymes: No results for input(s): CKTOTAL, CKMB, CKMBINDEX, TROPONINI in the last 168 hours. BNP (last 3 results) Recent Labs    05/22/24 0630  PROBNP 3,514.0*   HbA1C: No results for input(s): HGBA1C in the last 72 hours. CBG: Recent Labs  Lab 08/22/24 1158 08/22/24 1605 08/22/24 2138 08/23/24 0011 08/23/24 0840  GLUCAP 298* 166* 225* 212* 210*   Lipid Profile: No results for input(s): CHOL, HDL, LDLCALC, TRIG, CHOLHDL, LDLDIRECT in the last 72 hours. Thyroid  Function Tests: No results for input(s): TSH, T4TOTAL, FREET4, T3FREE, THYROIDAB in the last 72 hours. Anemia Panel: No results for input(s): VITAMINB12, FOLATE, FERRITIN, TIBC, IRON , RETICCTPCT in the last 72 hours. Sepsis Labs: No results for input(s): PROCALCITON, LATICACIDVEN in the last 168 hours.  Recent Results (from the past 240 hours)  Culture, Urine (Do not remove urinary catheter, catheter placed by urology or difficult to place)     Status: Abnormal   Collection Time: 08/15/24 10:34 AM   Specimen: Urine, Catheterized  Result Value Ref Range Status   Specimen Description URINE, CATHETERIZED  Final   Special Requests   Final    NONE Performed at [facility], [address] [place], [place] [address]    Culture >=100,000 COLONIES/mL KLEBSIELLA PNEUMONIAE (A)  Final   Report Status 08/17/2024 FINAL  Final   Organism ID, Bacteria KLEBSIELLA PNEUMONIAE (A)  Final      Susceptibility   Klebsiella pneumoniae - MIC*    AMPICILLIN  >=32 RESISTANT Resistant     CEFAZOLIN  (URINE) Value in next row Resistant      RESISTANTThis is a modified FDA-approved test that has been validated and its performance characteristics determined by the reporting laboratory.   This laboratory is certified under the Clinical Laboratory Improvement Amendments CLIA as qualified to perform high complexity clinical laboratory testing.    CEFEPIME  Value in next row Sensitive      RESISTANTThis is a modified FDA-approved test that has been validated and its performance characteristics determined by the reporting laboratory.  This laboratory is certified under the Clinical Laboratory Improvement Amendments CLIA as qualified to perform high complexity clinical laboratory testing.    ERTAPENEM Value in next row Sensitive      RESISTANTThis is a modified FDA-approved test that has been validated and its performance characteristics determined by the reporting laboratory.  This laboratory is certified under the Clinical Laboratory Improvement Amendments CLIA as qualified to perform high complexity clinical laboratory testing.    CEFTRIAXONE  Value in next row Sensitive      RESISTANTThis is a modified FDA-approved test that has been validated and its performance characteristics determined by the reporting laboratory.  This laboratory is certified under the Clinical Laboratory Improvement Amendments CLIA as qualified to perform high complexity clinical laboratory testing.    CIPROFLOXACIN  Value in next row Sensitive      RESISTANTThis is a modified FDA-approved test that has been validated and its performance characteristics determined by the reporting laboratory.  This laboratory is certified under the Clinical Laboratory Improvement Amendments CLIA as qualified to perform high complexity clinical laboratory testing.    GENTAMICIN Value in next row Sensitive      RESISTANTThis is a modified FDA-approved test that has been validated and its performance characteristics determined by the reporting laboratory.  This laboratory is certified under the Clinical Laboratory Improvement Amendments CLIA as qualified to perform high complexity clinical laboratory testing.    NITROFURANTOIN Value in next  row Intermediate      RESISTANTThis is a modified FDA-approved test that has been validated and its performance characteristics determined by the reporting laboratory.  This laboratory is certified under the Clinical Laboratory Improvement Amendments CLIA as qualified to perform high complexity clinical laboratory testing.    TRIMETH /SULFA  Value in next row Sensitive      RESISTANTThis is a modified FDA-approved test that has been validated and its performance characteristics determined by the reporting laboratory.  This laboratory is certified under the Clinical Laboratory Improvement Amendments CLIA as qualified to perform high complexity clinical laboratory testing.    AMPICILLIN /SULBACTAM Value in next row Resistant      RESISTANTThis is a modified FDA-approved test that has been validated and its performance characteristics determined by the reporting laboratory.  This laboratory is certified under the Clinical Laboratory Improvement Amendments CLIA as qualified to perform high complexity clinical laboratory testing.    PIP/TAZO Value in next row Sensitive      16 SENSITIVEThis is a modified FDA-approved test that has been validated and its performance characteristics determined by the reporting laboratory.  This laboratory is certified under the Clinical Laboratory Improvement Amendments CLIA as qualified to perform high complexity clinical laboratory testing.    MEROPENEM  Value in next row Sensitive      16 SENSITIVEThis is a modified FDA-approved test that has been validated and its performance characteristics determined by the reporting laboratory.  This laboratory is certified under the Clinical Laboratory Improvement Amendments CLIA as qualified to perform high complexity clinical laboratory testing.    * >=100,000 COLONIES/mL KLEBSIELLA PNEUMONIAE  Culture, blood (Routine X 2) w Reflex to ID Panel     Status: None   Collection Time: 08/15/24 12:41 PM   Specimen: BLOOD RIGHT HAND  Result  Value Ref Range Status   Specimen Description BLOOD RIGHT HAND  Final   Special Requests   Final    BOTTLES DRAWN AEROBIC AND ANAEROBIC Blood Culture adequate volume   Culture   Final    NO GROWTH 5 DAYS Performed at [facility], [address] [place], [place] [address]    Report Status 08/20/2024 FINAL  Final  Culture, blood (Routine X 2) w Reflex to ID Panel     Status: None   Collection Time: 08/15/24 12:41 PM   Specimen: BLOOD LEFT HAND  Result Value Ref Range Status   Specimen Description BLOOD LEFT HAND  Final   Special Requests   Final    BOTTLES DRAWN AEROBIC AND ANAEROBIC Blood Culture adequate volume   Culture   Final    NO GROWTH 5 DAYS Performed at [facility], [address] [place], [place] [address]    Report Status 08/20/2024 FINAL  Final     Radiology Studies: No results found.  Scheduled Meds:  amLODipine   10 mg Per Tube Daily   atorvastatin   80 mg Per Tube QHS   calcium  gluconate  2 g Intravenous Once   Chlorhexidine  Gluconate Cloth  6 each Topical Daily   free water   75 mL Per Tube Q4H   gabapentin   100 mg Per Tube Q8H   insulin  aspart  0-9 Units Subcutaneous Q4H   insulin  glargine  15 Units Subcutaneous BID   levETIRAcetam   500 mg Intravenous Q12H   lidocaine   1 patch Transdermal Daily   mouth rinse  15 mL Mouth Rinse 4 times per day   oxyCODONE   2.5 mg Per Tube Q8H   pantoprazole  (PROTONIX ) IV  40 mg Intravenous Q12H   propranolol   20 mg Per Tube TID   sodium chloride  flush  10-40 mL Intracatheter Q12H   Continuous Infusions:  anticoagulant sodium citrate      citrate dextrose      feeding supplement (GLUCERNA 1.5 CAL) 1,000 mL (08/22/24 1707)   potassium PHOSPHATE  IVPB (in mmol)       LOS: 37 days   Fredia Skeeter, MD [facility]  08/23/2024, 9:18 AM   *Please note that this is a verbal dictation therefore any spelling or grammatical errors are due to the Dragon Medical One system interpretation.  Please page via  Amion and do not message via secure chat for urgent patient care matters. Secure chat can be used for non urgent patient care matters.  How to contact the [facility] Attending or Consulting provider 7A - 7P or covering provider during after hours 7P -7A, for this patient?  Check the care team in [facility] and look for a) attending/consulting [facility] provider listed and b) the [facility] team listed. Page or secure chat 7A-7P. Log into [email or web address] and use [facility]'s universal password to access. If you do not have the password, please contact the hospital operator. Locate the [facility] provider you are looking for under [facility] and page to a number that you can be directly reached. If you still have difficulty reaching the provider, please page the [facility] (Director on Call) for the Hospitalists  listed on amion for assistance.  "

## 2024-08-23 NOTE — Progress Notes (Signed)
 TPE # 2  08/23/24 1640  Vitals  Temp 98.1 F (36.7 C)  Temp Source Oral  Pulse Rate 60  ECG Heart Rate 65  Resp 14  BP (!) 110/53  Oxygen  Therapy  SpO2 100 %  O2 Device Nasal Cannula  O2 Flow Rate (L/min) 2 L/min  Patient Activity (if Appropriate) In bed  Pulse Oximetry Type Continuous  Pain Assessment  Pain Scale 0-10  Pain Score 0  Faces Pain Scale 0  Apheresis   Procedure Comments Pt. yelling out at times. The floor RN is aware of patient behavior.  Post-apheresis  Net Removed (mL) 3702 mL  Replacement (mL) 3032 mL  ACDA infused (mL) 524 mL  Total Normal Saline Administered 160 mL  Total Calcium  Administered 100 mL  Tolerated Procedure Yes  Post-Procedure Comments VSS WNL and given Bedside RN report this is the patient #2 treatment and the patient next TPE treatment will be Tuesday 23, 2025.  Hemodialysis Catheter Right Internal jugular Triple lumen Temporary (Non-Tunneled)  Placement Date/Time: 08/18/24 0919   Placed prior to admission: No  Serial / Lot #: [id number]  Expiration Date: 03/02/28  Time Out: Correct patient;Correct site;Correct procedure  Maximum sterile barrier precautions: Hand hygiene;Large sterile sheet;C...  Site Condition No complications  Blue Lumen Status Infusing;Flushed;Antimicrobial dead end cap;Blood return noted  Red Lumen Status Infusing;Flushed;Dead end cap in place;Antimicrobial dead end cap;Blood return noted  Purple Lumen Status Infusing  Catheter fill solution 4% Sodium Citrate   Catheter fill volume (Arterial) 1.4 cc  Catheter fill volume (Venous) 1.4  Dressing Type Transparent  Dressing Status Antimicrobial disc/dressing in place;Clean, Dry, Intact  Interventions  (Deassessed)  Drainage Description None  Dressing Change Due 08/30/24  Post treatment catheter status Capped and Clamped

## 2024-08-23 NOTE — Progress Notes (Addendum)
 NEUROLOGY CONSULT FOLLOW UP NOTE   Date of service: August 23, 2024 Patient Name: Brenda [name] MRN:  [id number] DOB:  05-06-1961  Interval Hx/subjective   Received pain medication shortly before I saw her. Per family, still in pain when awake.   Vitals   Vitals:   08/22/24 2140 08/23/24 0351 08/23/24 0823 08/23/24 1550  BP: (!) 115/58  (!) 131/47 (!) 104/46  Pulse: 62  77 67  Resp:   18 14  Temp: 98.1 F (36.7 C)  98.5 F (36.9 C) 98.4 F (36.9 C)  TempSrc: Axillary  Oral Oral  SpO2: 100%  100% 100%  Weight:  88.5 kg  88.5 kg  Height:         Physical Exam    Neuro: Mental Status: Patient is sleeping, but she does arouse and answer yes/no questiosn ranial Nerves: II: blinks to threat bilaterally. Pupils are unequal, left is larger and less reactive, documented repeatedly in previous notes as chronic.  III,IV, VI: eyes are midline, but her head is turned to the right.  Motor: Tone is increased throughout. Sensory: She responds to nox stim x 4.     Medications  Current Facility-Administered Medications:    albumin  human 25 % 50 g in sodium chloride  0.9 %, , Dialysis, Q1 Hr x 3, Perri Olam POUR, RPH, New Bag at 08/23/24 1536   amLODipine  (NORVASC ) tablet 10 mg, 10 mg, Per Tube, Daily, Drusilla, Sabas RAMAN, MD, 10 mg at 08/23/24 9175   anticoagulant sodium citrate  solution 5 mL, 5 mL, Intracatheter, Once, Michaela Aisha SQUIBB, MD   artificial tears (LACRILUBE) ophthalmic ointment, , Both Eyes, Q4H PRN, Cosette Blackwater, MD, Given at 08/11/24 2102   atorvastatin  (LIPITOR ) tablet 80 mg, 80 mg, Per Tube, QHS, Sigdel, Santosh, MD, 80 mg at 08/22/24 2142   calcium  carbonate (TUMS - dosed in mg elemental calcium ) chewable tablet 400 mg of elemental calcium , 2 tablet, Oral, Q3H, Michaela Aisha SQUIBB, MD, 400 mg of elemental calcium  at 08/23/24 1452   calcium  gluconate 2 g/ 100 mL sodium chloride  IVPB, 2 g, Intravenous, Once, Michaela Aisha SQUIBB, MD, Last Rate: 50 mL/hr at  08/23/24 1541, 2,000 mg at 08/23/24 1541   Chlorhexidine  Gluconate Cloth 2 % PADS 6 each, 6 each, Topical, Daily, Chavez, Abigail, NP, 6 each at 08/23/24 0850   citrate dextrose  (ACD-A  anticoagulant) solution 1,000 mL, 1,000 mL, Other, Continuous, Michaela Aisha SQUIBB, MD, 1,000 mL at 08/23/24 1539   diphenhydrAMINE  (BENADRYL ) 12.5 MG/5ML elixir 25 mg, 25 mg, Oral, Q6H PRN, Perri DELENA Meliton Mickey., MD, 25 mg at 08/22/24 2140   diphenhydrAMINE  (BENADRYL ) capsule 25 mg, 25 mg, Oral, Q6H PRN, Michaela Aisha SQUIBB, MD, 25 mg at 08/23/24 1452   feeding supplement (GLUCERNA 1.5 CAL) liquid 1,000 mL, 1,000 mL, Per Tube, Continuous, Perri DELENA Meliton Mickey., MD, Last Rate: 45 mL/hr at 08/22/24 1707, 1,000 mL at 08/22/24 1707   free water  75 mL, 75 mL, Per Tube, Q4H, Cosette Blackwater, MD, 75 mL at 08/23/24 1216   gabapentin  (NEURONTIN ) 250 MG/5ML solution 100 mg, 100 mg, Per Tube, Q8H, Perri DELENA Meliton Mickey., MD, 100 mg at 08/23/24 1359   guaiFENesin  (ROBITUSSIN) 100 MG/5ML liquid 15 mL, 15 mL, Per Tube, Q6H PRN, Drusilla Sabas RAMAN, MD, 15 mL at 08/23/24 0825   HYDROmorphone  (DILAUDID ) injection 0.5 mg, 0.5 mg, Intravenous, Q2H PRN, 0.5 mg at 08/23/24 1428 **OR** [DISCONTINUED] HYDROmorphone  (DILAUDID ) injection 1 mg, 1 mg, Intravenous, Q2H PRN, Perri DELENA Meliton Mickey., MD  insulin  aspart (novoLOG ) injection 0-9 Units, 0-9 Units, Subcutaneous, Q4H, Perri DELENA Meliton Mickey., MD, 2 Units at 08/23/24 1216   insulin  glargine (LANTUS ) injection 15 Units, 15 Units, Subcutaneous, BID, Pahwani, Ravi, MD   levETIRAcetam  (KEPPRA ) undiluted injection 500 mg, 500 mg, Intravenous, Q12H, Howerter, Justin B, DO, 500 mg at 08/23/24 0825   lidocaine  (LIDODERM ) 5 % 1 patch, 1 patch, Transdermal, Daily, Cheryle, Kshitiz, MD, 1 patch at 08/23/24 9176   LORazepam  (ATIVAN ) injection 1 mg, 1 mg, Intravenous, Q4H PRN, Perri DELENA Meliton Mickey., MD, 1 mg at 08/23/24 1216   naloxone  (NARCAN ) injection 0.4 mg, 0.4 mg, Intravenous, PRN, Alfornia Madison, MD   ondansetron  (ZOFRAN ) tablet 4 mg, 4 mg, Per Tube, Q6H PRN **OR** ondansetron  (ZOFRAN ) injection 4 mg, 4 mg, Intravenous, Q6H PRN, Cosette Blackwater, MD   Oral care mouth rinse, 15 mL, Mouth Rinse, 4 times per day, Perri DELENA Meliton Mickey., MD, 15 mL at 08/23/24 1216   Oral care mouth rinse, 15 mL, Mouth Rinse, PRN, Perri DELENA Meliton Mickey., MD, 15 mL at 08/21/24 1145   oxyCODONE  (Oxy IR/ROXICODONE ) immediate release tablet 2.5 mg, 2.5 mg, Per Tube, Q8H, 2.5 mg at 08/23/24 1358 **AND** [DISCONTINUED] oxyCODONE  (Oxy IR/ROXICODONE ) immediate release tablet 2.5 mg, 2.5 mg, Oral, Q4H PRN, Perri DELENA Meliton Mickey., MD, 2.5 mg at 08/20/24 1122   oxyCODONE  (Oxy IR/ROXICODONE ) immediate release tablet 2.5 mg, 2.5 mg, Oral, Q4H PRN **OR** oxyCODONE  (Oxy IR/ROXICODONE ) immediate release tablet 5 mg, 5 mg, Oral, Q4H PRN, Perri DELENA Meliton Mickey., MD, 5 mg at 08/23/24 9176   pantoprazole  (PROTONIX ) injection 40 mg, 40 mg, Intravenous, Q12H, Perri DELENA Meliton Mickey., MD, 40 mg at 08/23/24 9175   potassium PHOSPHATE  30 mmol in dextrose  5 % 500 mL infusion, 30 mmol, Intravenous, Once, Vernon Ranks, MD, Last Rate: 85 mL/hr at 08/23/24 1044, 30 mmol at 08/23/24 1044   prochlorperazine  (COMPAZINE ) injection 10 mg, 10 mg, Intravenous, Q6H PRN, Howerter, Justin B, DO   propranolol  (INDERAL ) tablet 20 mg, 20 mg, Per Tube, TID, Sigdel, Santosh, MD, 20 mg at 08/23/24 9176   senna-docusate (Senokot-S) tablet 1 tablet, 1 tablet, Oral, QHS PRN, Foust, Katy L, NP   sodium chloride  flush (NS) 0.9 % injection 10-40 mL, 10-40 mL, Intracatheter, Q12H, Sigdel, Santosh, MD, 40 mL at 08/23/24 0826   sodium chloride  flush (NS) 0.9 % injection 10-40 mL, 10-40 mL, Intracatheter, PRN, Mcarthur Pick, MD  Labs and Diagnostic Imaging   CBC:  Recent Labs  Lab 08/22/24 0500 08/22/24 1618 08/23/24 0350  WBC 5.3  --  5.2  NEUTROABS 3.9  --  3.3  HGB 7.6* 8.0* 8.0*  HCT 24.0* 25.3* 25.3*  MCV 95.6  --  94.8  PLT 170  --  191     Basic Metabolic Panel:  Lab Results  Component Value Date   NA 136 08/23/2024   K 4.4 08/23/2024   CO2 28 08/23/2024   GLUCOSE 229 (H) 08/23/2024   BUN 20 08/23/2024   CREATININE 0.35 (L) 08/23/2024   CALCIUM  8.1 (L) 08/23/2024   GFRNONAA >60 08/23/2024   GFRAA 94 12/16/2019   Lipid Panel:  Lab Results  Component Value Date   LDLCALC 28 05/30/2024   HgbA1c:  Lab Results  Component Value Date   HGBA1C 7.2 (H) 06/01/2024   Urine Drug Screen:     Component Value Date/Time   LABOPIA POSITIVE (A) 04/20/2024 1817   COCAINSCRNUR NONE DETECTED 04/20/2024 1817   LABBENZ NONE DETECTED 04/20/2024 1817  AMPHETMU NONE DETECTED 04/20/2024 1817   THCU NONE DETECTED 04/20/2024 1817   LABBARB NONE DETECTED 04/20/2024 1817    Alcohol Level     Component Value Date/Time   ETH <15 04/20/2024 1344   INR  Lab Results  Component Value Date   INR 1.0 08/18/2024   APTT  Lab Results  Component Value Date   APTT 67 (H) 06/21/2024   Imaging personally reviewed MRI Brain(Personally reviewed): 1. Similar evolving infarcts in the anterior and posterior circulation. 2. No new infarcts, progressive mass effect or acute hemorrhage.  Assessment   Kassondra Geil is a 63 y.o. 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  Cervical spine imaging also unremarkable for acute process.  At this time, stiffness on her exam coupled with altered mental status is of unclear etiology. Possibilities include side effect/neurotoxicity from the PD-1 inhibitor pembrolizumab , which is being used to treat her cancer, other paraneoplastic syndrome including possibly GAD65 related stiff  person syndrome. Her exam with waxy flexibility and decreased but intermittent verbalization could have been consistent with catatonia, but the time course would be unsual and lack of response to Ativan  makes this much less likely.  Steroids have been tried with no improvement and PLEX has been discussed in case this is an irAE associated with keytruda , or some other antibody drive autoimmune process.   Her CSF results do not favor acute inflammatory demyelinating  polyneuropathy/Guillain-Barr type of pathology, nor does her clinical presentation/exam.   Recommendations  2nd Plasmapheresis today, then every other day x 5 days total F/u paraneoplastic panel Neurology will follow.  ______________________________________________________________________  Aisha Seals, MD [facility]   If 7pm- 7am, please page neurology on call as listed in [facility].

## 2024-08-23 NOTE — Plan of Care (Signed)

## 2024-08-24 DIAGNOSIS — F418 Other specified anxiety disorders: Secondary | ICD-10-CM | POA: Diagnosis not present

## 2024-08-24 DIAGNOSIS — N390 Urinary tract infection, site not specified: Secondary | ICD-10-CM | POA: Diagnosis not present

## 2024-08-24 DIAGNOSIS — R4182 Altered mental status, unspecified: Secondary | ICD-10-CM | POA: Diagnosis not present

## 2024-08-24 DIAGNOSIS — T45AX5A Adverse effect of immune checkpoint inhibitors and immunostimulant drugs, initial encounter: Secondary | ICD-10-CM | POA: Diagnosis not present

## 2024-08-24 DIAGNOSIS — Z515 Encounter for palliative care: Secondary | ICD-10-CM | POA: Diagnosis not present

## 2024-08-24 DIAGNOSIS — G9341 Metabolic encephalopathy: Secondary | ICD-10-CM | POA: Diagnosis not present

## 2024-08-24 DIAGNOSIS — T45AX4A Poisoning by immune checkpoint inhibitors and immunostimulant drugs, undetermined, initial encounter: Secondary | ICD-10-CM | POA: Diagnosis not present

## 2024-08-24 LAB — GLUCOSE, CAPILLARY
Glucose-Capillary: 137 mg/dL — ABNORMAL HIGH (ref 70–99)
Glucose-Capillary: 144 mg/dL — ABNORMAL HIGH (ref 70–99)
Glucose-Capillary: 164 mg/dL — ABNORMAL HIGH (ref 70–99)
Glucose-Capillary: 166 mg/dL — ABNORMAL HIGH (ref 70–99)
Glucose-Capillary: 167 mg/dL — ABNORMAL HIGH (ref 70–99)
Glucose-Capillary: 176 mg/dL — ABNORMAL HIGH (ref 70–99)
Glucose-Capillary: 184 mg/dL — ABNORMAL HIGH (ref 70–99)
Glucose-Capillary: 190 mg/dL — ABNORMAL HIGH (ref 70–99)

## 2024-08-24 MED ORDER — HYDROMORPHONE HCL 1 MG/ML IJ SOLN
1.0000 mg | Freq: Once | INTRAMUSCULAR | Status: AC
  Administered 2024-08-24: 1 mg via INTRAVENOUS
  Filled 2024-08-24: qty 1

## 2024-08-24 MED ORDER — ALPRAZOLAM 0.25 MG PO TABS
0.2500 mg | ORAL_TABLET | Freq: Three times a day (TID) | ORAL | Status: DC | PRN
  Administered 2024-08-24 – 2024-09-15 (×37): 0.25 mg
  Filled 2024-08-24 (×28): qty 1

## 2024-08-24 MED ORDER — LORAZEPAM 2 MG/ML IJ SOLN
1.0000 mg | INTRAMUSCULAR | Status: DC | PRN
  Administered 2024-08-24 – 2024-09-03 (×26): 1 mg via INTRAVENOUS
  Filled 2024-08-24 (×19): qty 1

## 2024-08-24 MED ORDER — OXYCODONE HCL 5 MG PO TABS
5.0000 mg | ORAL_TABLET | Freq: Four times a day (QID) | ORAL | Status: DC
  Administered 2024-08-24 – 2024-09-01 (×28): 5 mg
  Filled 2024-08-24 (×22): qty 1

## 2024-08-24 NOTE — Plan of Care (Signed)
" °  Problem: Metabolic: Goal: Ability to maintain appropriate glucose levels will improve Outcome: Progressing   Problem: Nutritional: Goal: Maintenance of adequate nutrition will improve Outcome: Progressing   Problem: Skin Integrity: Goal: Risk for impaired skin integrity will decrease Outcome: Progressing   Problem: Tissue Perfusion: Goal: Adequacy of tissue perfusion will improve Outcome: Progressing   Problem: Coping: Goal: Ability to adjust to condition or change in health will improve Outcome: Not Progressing   Problem: Health Behavior/Discharge Planning: Goal: Ability to manage health-related needs will improve Outcome: Not Progressing   "

## 2024-08-24 NOTE — Progress Notes (Signed)
 " Daily Progress Note   Date: 08/24/2024   Patient Name: Brenda [name]  DOB: [birth year]  MRN: [id number]  Age / Sex: 63 y.o., female  Attending Physician: Vernon Ranks, MD Primary Care Physician: [facility] Admit Date: 07/15/2024 Length of Stay: 38 days  Reason for Follow-up: Establishing goals of care  Assessment & Plan:   HPI/Patient Profile:  63 y.o. 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   Palliative medicine consulted for goals of care conversation.   SUMMARY OF RECOMMENDATIONS Continue DNR limited Continue current care plan and allow more time for outcomes.  Patient's family grateful for improved rigidity with plasmapheresis Increased dose and frequency of scheduled oxycodone  to 5 mg every 6 hours per tube Increased frequency of as needed IV Ativan  to every 2 hours as needed Ordered Xanax  0.25 mg per tube 3 times daily as needed for anxiety/agitation Psychosocial and emotional support provided Continue to follow for GOC and palliative support  Symptom Management:  Per primary team  Code Status: DNR - Limited (DNR/DNI)  Prognosis: Unable to determine  Discharge Planning: To Be Determined  Discussed with: patient's husband and daughter, RN, MD  Subjective:   Subjective: Chart reviewed including progress notes, vital signs, labs, and imaging. Patient assessed at the bedside.  She was resting, at times woke up and called out for her mother then went back to sleep.  Left hand appears more relaxed.  Created space and opportunity for patient's family to explore thoughts and feelings regarding patient's current medical situation.  Her daughter shares her feeling that patient has had gradual improvement with first 2 plasmapheresis treatments.  She also reports that  patient's husband will be visiting shortly and that he would benefit from palliative visit.  Return to the bedside later on to meet with patient's husband.  Emotional support and therapeutic listening was provided.  He agrees with their daughter and points out improved rigidity in her left hand, more so than her right hand, as well as her foot.  He reports that she has been attempting to communicate at times and he feels that she is still struggling with anxiety, with less pain overall since arrival.  He agrees that she likely needs more pain medication and anxiety medication, balancing needs with avoiding sedation is much as possible.  He worries that she might not make as much progress if suffering with fear, distress, and pain.  Discussed risks and benefits of options to adjust symptom management strategies including adding oral medicines for longer acting management of anxiety, increasing frequency of available IV medicines for breakthrough symptoms, and adjustments to scheduled oxycodone .  He is agreeable to recommendations today.  Questions and concerns addressed.  PMT will continue to follow and support.  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  Constitutional:      General: She is not in acute distress.    Appearance: She is obese. She is ill-appearing.  HENT:      Head: Normocephalic and atraumatic.     Nose: Nose normal.     Mouth/Throat:     Mouth: Mucous membranes are dry.  Cardiovascular:     Rate and Rhythm: Normal rate.  Pulmonary:     Effort: Pulmonary effort is normal.  Abdominal:     Palpations: Abdomen is soft.  Skin:    General: Skin is warm.  Neurological:     Comments: Sleeping  Psychiatric:        Behavior: Behavior normal.    Palliative Assessment/Data: 30% (on tube feeds)    Billing based on time  Time Total: 50  Visit consisted of counseling and education dealing with the complex and emotionally intense issues of symptom management and palliative care in the setting of serious and potentially life-threatening illness. Greater than 50% of this time was spent counseling and coordinating care related to the above assessment and plan.  Personally spent 50 minutes in patient care including extensive chart review (labs, imaging, progress/consult notes, vital signs), medically appropraite exam, discussed with treatment team, education to patient, family, and staff, documenting clinical information, medication review and management, coordination of care, and available advanced directive documents.    "

## 2024-08-24 NOTE — Plan of Care (Signed)
 Problem: Education: Goal: Ability to describe self-care measures that may prevent or decrease complications (Diabetes Survival Skills Education) will improve Outcome: Not Progressing Goal: Individualized Educational Video(s) Outcome: Not Progressing   Problem: Coping: Goal: Ability to adjust to condition or change in health will improve Outcome: Not Progressing   Problem: Fluid Volume: Goal: Ability to maintain a balanced intake and output will improve Outcome: Not Progressing   Problem: Health Behavior/Discharge Planning: Goal: Ability to identify and utilize available resources and services will improve Outcome: Not Progressing Goal: Ability to manage health-related needs will improve Outcome: Not Progressing   Problem: Metabolic: Goal: Ability to maintain appropriate glucose levels will improve Outcome: Not Progressing   Problem: Nutritional: Goal: Maintenance of adequate nutrition will improve Outcome: Not Progressing Goal: Progress toward achieving an optimal weight will improve Outcome: Not Progressing   Problem: Skin Integrity: Goal: Risk for impaired skin integrity will decrease Outcome: Not Progressing   Problem: Tissue Perfusion: Goal: Adequacy of tissue perfusion will improve Outcome: Not Progressing   Problem: Education: Goal: Knowledge of General Education information will improve Description: Including pain rating scale, medication(s)/side effects and non-pharmacologic comfort measures Outcome: Not Progressing   Problem: Health Behavior/Discharge Planning: Goal: Ability to manage health-related needs will improve Outcome: Not Progressing   Problem: Clinical Measurements: Goal: Ability to maintain clinical measurements within normal limits will improve Outcome: Not Progressing Goal: Will remain free from infection Outcome: Not Progressing Goal: Diagnostic test results will improve Outcome: Not Progressing Goal: Respiratory complications will  improve Outcome: Not Progressing Goal: Cardiovascular complication will be avoided Outcome: Not Progressing   Problem: Activity: Goal: Risk for activity intolerance will decrease Outcome: Not Progressing   Problem: Nutrition: Goal: Adequate nutrition will be maintained Outcome: Not Progressing   Problem: Coping: Goal: Level of anxiety will decrease Outcome: Not Progressing   Problem: Elimination: Goal: Will not experience complications related to bowel motility Outcome: Not Progressing Goal: Will not experience complications related to urinary retention Outcome: Not Progressing   Problem: Pain Managment: Goal: General experience of comfort will improve and/or be controlled Outcome: Not Progressing   Problem: Safety: Goal: Ability to remain free from injury will improve Outcome: Not Progressing   Problem: Skin Integrity: Goal: Risk for impaired skin integrity will decrease Outcome: Not Progressing   Problem: Education: Goal: Knowledge of General Education information will improve Description: Including pain rating scale, medication(s)/side effects and non-pharmacologic comfort measures Outcome: Not Progressing   Problem: Health Behavior/Discharge Planning: Goal: Ability to manage health-related needs will improve Outcome: Not Progressing   Problem: Clinical Measurements: Goal: Ability to maintain clinical measurements within normal limits will improve Outcome: Not Progressing Goal: Will remain free from infection Outcome: Not Progressing Goal: Diagnostic test results will improve Outcome: Not Progressing Goal: Respiratory complications will improve Outcome: Not Progressing Goal: Cardiovascular complication will be avoided Outcome: Not Progressing   Problem: Activity: Goal: Risk for activity intolerance will decrease Outcome: Not Progressing   Problem: Nutrition: Goal: Adequate nutrition will be maintained Outcome: Not Progressing   Problem:  Coping: Goal: Level of anxiety will decrease Outcome: Not Progressing   Problem: Elimination: Goal: Will not experience complications related to bowel motility Outcome: Not Progressing Goal: Will not experience complications related to urinary retention Outcome: Not Progressing   Problem: Pain Managment: Goal: General experience of comfort will improve and/or be controlled Outcome: Not Progressing   Problem: Safety: Goal: Ability to remain free from injury will improve Outcome: Not Progressing   Problem: Skin Integrity: Goal: Risk for impaired skin integrity  will decrease Outcome: Not Progressing

## 2024-08-24 NOTE — Progress Notes (Signed)
 IP PROGRESS NOTE  Subjective:   Brenda [name] appears unchanged.  Her daughter reports she has been more alert and verbal.  She is less rigid .   Objective: Vital signs in last 24 hours: Blood pressure (!) 131/48, pulse (!) 59, temperature 98.6 F (37 C), temperature source Oral, resp. rate 18, height 5' 2 (1.575 m), weight 197 lb (89.4 kg), last menstrual period 10/30/2014, SpO2 100%.  Intake/Output from previous day: 12/21 0701 - 12/22 0700 In: 3099.5 [I.V.:40; NG/GT:3034.5; IV Piggyback:25] Out: 1500 [Urine:1500]  Physical Exam: Alert Neurologic: Speaking in words, says she is okay , not moving extremities to command.  Withdraws toes noxious stimuli Abdomen: Soft and nontender  Portacath/PICC-without erythema  Lab Results: Recent Labs    08/22/24 0500 08/22/24 1618 08/23/24 0350  WBC 5.3  --  5.2  HGB 7.6* 8.0* 8.0*  HCT 24.0* 25.3* 25.3*  PLT 170  --  191    BMET Recent Labs    08/22/24 0500 08/23/24 0350  NA 136 136  K 4.6 4.4  CL 106 104  CO2 27 28  GLUCOSE 358* 229*  BUN 21 20  CREATININE 0.39* 0.35*  CALCIUM  8.0* 8.1*     Medications: I have reviewed the patient's current medications.  Assessment/Plan:  Endometrial cancer Diagnosed in 2023, status post surgery and radiation Recurrent disease July 2025 with a left retroperitoneal mass, biopsy confirmed adenocarcinoma, ER positive, MSI-high, tumor mutation burden 24, PD-L1 75%, PIK 3CA mutated, PTEN mutated, HER2 1+, loss of MLH1 expression 04/03/2024 CT chest: Spiculated 9 x 12 mm right upper lobe and part solid 4 x 6 mm right upper lobe nodules Cycle 1 paclitaxel /carboplatin /pembrolizumab  05/01/2024 Cycle 2 paclitaxel /carboplatin /pembrolizumab  06/30/2024 06/19/2024 CT abdomen/pelvis: Right lower lobe pulmonary embolism, splenic infarct, stable size of the left psoas mass with evidence of erosive change at the anterior aspect of L2, decreased size of left external iliac node 08/09/2024 CTs: New  psoas muscle mass (tumor versus hematoma), left psoas/paraspinal mass has decreased in size, by my review the previously noted lung nodules have resolved  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  DVT 06/02/2024 4.  Pulmonary embolism noted on CT chest 06/19/2024 5.  Diabetes 6.  CHF/CAD 7.  Seizures, cefepime  toxicity?  November 2025 8.  Progressive generalized loss of motor function-related to CVAs?,  Paraneoplastic? 08/11/2024-Solu-Medrol  daily x 3 Therapy plasma exchange 08/21/2024, 08/23/2024 9.  Respiratory failure 10.  Pain secondary to #1 11.  Anemia 12.  Elevated liver enzymes  Brenda [name] has a history of endometrial cancer initially diagnosed in 2020.  She was diagnosed with recurrent disease involving a left retroperitoneal mass in July 2025.  She completed 2 treatments with paclitaxel /carboplatin /pembrolizumab .  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  She completed 2 cycles of systemic therapy for treatment of the uterine cancer.  I reviewed the 08/09/2024 restaging CT findings with Brenda [name] and her daughter.  The known malignancy at the left retroperitoneum has improved significantly.  The significance of the right psoas mass is unclear.  I suspect this is a benign finding.      The etiology of the diffuse generalized weakness is unclear.  There is likely a component related to the embolic CVAs and deconditioning, but the evolution  of the loss of motor function over the past several weeks appears to be more severe than expected.  She has undergone an extensive neurologic evaluation over the past month.  Neurology is following her in the hospital..  Neurology  feels her symptoms may be related to PD-1 inhibitor neurotoxicity.  She started high-dose Solu-Medrol  on 08/11/2024, given daily for 3 days.  Her neurologic status did not improve.  She began plasma exchange on 08/21/2024.  The etiology of her pain is unclear based on the restaging CT.  The retroperitoneal mass is much smaller.  She could be having pain from the right psoas lesion or another etiology.    She has persistent anemia.  The anemia is multifactorial including components related to bleeding, phlebotomy, and chronic disease  Recommendations: Continue narcotic analgesics as needed for pain Continue evaluation of her mental status, seizures, and motor weakness per neurology, continue plasma exchange as recommended by neurology Anticoagulation on hold after the episode of rectal bleeding, resume per the medical service  tube feedings, follow-up with speech pathology Elevated liver enzymes-evaluate per the medical service, Lipitor ?  Improved. Systemic treatment for the uterine cancer will remain on hold Please call oncology as needed, I discussed the situation with her daughter and I am available to speak with her husband today if desired, I will see her 08/25/2024             LOS: 38 days   Arley Hof, MD   08/24/2024, 1:17 PM

## 2024-08-24 NOTE — Progress Notes (Signed)
 " PROGRESS NOTE    Brenda [name]  FMW:[id number] DOB: Sep 21, 1960 DOA: 07/15/2024 PCP: [facility]   Brief Narrative:  63 year old female with history of metastatic endometrial cancer, chronic cancer-related pain, hypertension, hyperlipidemia, diabetes mellitus, CVA, history of DVT and PE anticoagulated on Lovenox , OSA, chronic hypoxic respiratory failure on 2L via nasal cannula and recent hospitalization and discharged on 07/14/2024 for UTI treated with IV antibiotics followed by oral antibiotics on discharge presented with chills and weakness.  She was found to have UTI and was started on IV cefepime .  Unfortunately mental status worsened and she was transferred to [facility] for continuous EEG monitoring with 4 seizures noted on video EEG. She was on Keppra  11/16. Placed on empiric broad-spectrum antibiotics for possible meningitis/encephalitis which was ruled out based on CSF results. .    Neurology has been reconsulted 12/7 with development of rigidity.  Oncology has been reconsulted.  Family has begun to discuss palliative care, but at this point managing her cancer related pain and working on rigidity with neurology.  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 4 seizures noted 11/16. S/p LP, meningitis ruled out Her  husband notes worsening stiffness/rigidity/inability to move extremities starting about 1 week ago. EEG without epileptiform abnormalities. head CT without acute intracranial abnormality. MRI brain with evolving infarcts - MRI C spine without evidence of metastatic disease.  Remains on Keppra .  Neurology managing. appreciate neurology recommendations - ddx includes PD-1 inhibitor induced side effect, paraneoplatic syndrome, stiff person syndrome.  Considering catatonia.  We've stopped prozac  (low dose, lower suspicion for serotonin syndrome?).  S/p high dose steroids 12/9-11.  S/p trial sinimet.  S/p baclofen .  Follow paraneoplastic panel, GAD65 ab.  S/p PLEX #1 on 12/19.   Acute Metabolic Encephalopathy Intermittently encephalopathic. Delirium precautions  issues with somnolence on fentanyl , will balance need for opiates with sedation    Anemia / Bright Red Blood Per Rectum BRBPR resolved at this time - anticoagulation on hold  CT GI bleed study without evidence GI bleed. IV PPI BID S/p 1 unit pRBC 12/1 - S/p IV iron . Normal folate, b12.  Normal iron , ferritin.  Hemoglobin fairly stable.   Klebsiella Pneumoniae UTI Treated with abx for UTI - s/p 5 day course Blood cultures NGx2   Metastatic Endometrial Cancer / Cancer Related Pain  Appreciate oncology consult  CT chest/abdomen/pelvis for persistent (worsening?) back pain (note centrally necrotic infiltrative mass in L psoas in 06/2024) CT 12/7 with R psoas muscle mass like enlargement measuring 5.0 x 4.6 cm indeterminate for tumor vs hematoma  - L psoas/paraspinal mass decreased in size.   CT 12/16 with R psoas muscle hematoma (discussed with IR 12/19, who noted nothing to drain), L2 sclerotic changes with L paraspinal masslike tissue at L2 concerning for neoplastic process Appreciate oncology needs - last  saw 12/18 Will continue to adjust pain meds.     Elevated LFT's Monitor - relatively mild, will trend for now   Dysphagia Cortrak +  tube feeds - eventually decision regarding PEG will need to be made, but will hold off for now with our trial of plex   Depression Anxiety Prozac  discontinued with rigidity   Pulmonary Embolism Splenic Infarct Lovenox  on hold with GI bleed and psoas hematoma above   CAD  Hx Stroke Lovenox  on hold   Hypernatremia Resolved   Abnormal Thyroid  Function Tests Repeat TSH and free T4 wnl Negative TSI On propanolol    T2DM Currently on Lantus  15 units twice daily and SSI, blood sugar now much better controlled.  Goals of Care Mrs. Mirza and her husband beginning to talk about palliative care on the day I met them (12/7).  I had a conversation with them about comfort focused care and hospice, but at this point, we're trying to work up and treat the acute issues below (with regards to her rigidity and encephalopathy).  At this point in time, husband and the family are hoping to trial PLEX.  Will do our best to keep her as comfortable as possible as we pursue this treatment option with neurology assistance.  Per palliative, husband asking about transfer if she's not improved with this but not sure if he says that when he is emotional or not.  Will address once 5 days of Plex is completed.  Appreciate palliative care following this patient.  DVT prophylaxis: Place and maintain sequential compression device Start: 08/02/24 0738   Code Status: Limited: Do not attempt resuscitation (DNR) -DNR-LIMITED -Do Not Intubate/DNI   Family Communication: Daughter present at bedside.   Status is: Inpatient Remains inpatient appropriate because: Encephalopathic   Estimated body mass index is 36.03 kg/m as calculated from the following:   Height as of this encounter: 5' 2 (1.575 m).   Weight as of this encounter: 89.4 kg.  Wound 07/25/24 1100 Pressure Injury Sacrum Medial Deep Tissue Pressure Injury - Purple or maroon localized area of discolored intact skin or blood-filled blister due to damage of  underlying soft tissue from pressure and/or shear. (Active)     Wound 08/11/24 0415 Pressure Injury Vertebral column Medial Stage 2 -  Partial thickness loss of dermis presenting as a shallow open injury with a red, pink wound bed without slough. (Active)   Nutritional Assessment: Body mass index is 36.03 kg/m.SABRA Seen by dietician.  I agree with the assessment and plan as outlined below: Nutrition Status: Nutrition Problem: Moderate Malnutrition Etiology: chronic illness (cancer) Signs/Symptoms: percent weight loss, mild muscle depletion, moderate muscle depletion (12% x 6 months) Percent weight loss: 12 % Interventions: Prostat, Tube feeding, MVI  . Skin Assessment: I have examined the patient's skin and I agree with the wound assessment as performed by the wound care RN as outlined below: Wound 07/25/24 1100 Pressure Injury Sacrum Medial Deep Tissue Pressure Injury - Purple or maroon localized area of discolored intact skin or blood-filled blister due to damage of underlying soft tissue from pressure and/or shear. (Active)     Wound 08/11/24 0415 Pressure Injury Vertebral column Medial Stage 2 -  Partial thickness loss of dermis presenting as a shallow open injury with a red, pink wound bed without slough. (Active)    Consultants:  Neurology, palliative care  Procedures:  As above  Antimicrobials:  Anti-infectives (From admission, onward)    Start     Dose/Rate Route Frequency Ordered  Stop   08/16/24 1100  cefTRIAXone  (ROCEPHIN ) 2 g in sodium chloride  0.9 % 100 mL IVPB        2 g 200 mL/hr over 30 Minutes Intravenous Every 24 hours 08/16/24 1001 08/20/24 1214   07/23/24 0600  vancomycin  (VANCOCIN ) IVPB 1000 mg/200 mL premix  Status:  Discontinued        1,000 mg 200 mL/hr over 60 Minutes Intravenous Every 12 hours 07/22/24 1834 07/24/24 1808   07/22/24 2200  meropenem  (MERREM ) 2 g in sodium chloride  0.9 % 100 mL IVPB  Status:  Discontinued        2 g 280 mL/hr over 30  Minutes Intravenous Every 8 hours 07/22/24 1601 07/24/24 1808   07/22/24 1700  vancomycin  (VANCOREADY) IVPB 1500 mg/300 mL        1,500 mg 150 mL/hr over 120 Minutes Intravenous  Once 07/22/24 1601 07/22/24 2031   07/22/24 1645  ampicillin  (OMNIPEN) 2 g in sodium chloride  0.9 % 100 mL IVPB  Status:  Discontinued        2 g 300 mL/hr over 20 Minutes Intravenous Every 6 hours 07/22/24 1557 07/24/24 1800   07/19/24 0530  meropenem  (MERREM ) 1 g in sodium chloride  0.9 % 100 mL IVPB  Status:  Discontinued        1 g 200 mL/hr over 30 Minutes Intravenous Every 8 hours 07/19/24 0438 07/22/24 1601   07/16/24 0900  ceFEPIme  (MAXIPIME ) 2 g in sodium chloride  0.9 % 100 mL IVPB  Status:  Discontinued        2 g 200 mL/hr over 30 Minutes Intravenous Every 8 hours 07/16/24 0811 07/19/24 0409   07/16/24 0815  ceFEPIme  (MAXIPIME ) 2 g in sodium chloride  0.9 % 100 mL IVPB  Status:  Discontinued        2 g 200 mL/hr over 30 Minutes Intravenous  Once 07/16/24 0803 07/16/24 0811   07/16/24 0330  cefTRIAXone  (ROCEPHIN ) 2 g in sodium chloride  0.9 % 100 mL IVPB        2 g 200 mL/hr over 30 Minutes Intravenous  Once 07/16/24 0315 07/16/24 0412         Subjective: Patient seen and examined.  Daughter at the bedside.  Patient kept her daughter's close during the whole conversation however she appeared to be more oriented, she knew her name and the fact that she is at [facility].  She could not tell me the month or the year though.  Denied any complaint.  Per daughter, patient slept well but she received another pain medication just about an hour before I saw her so that is why she was slightly lethargic.  Objective: Vitals:   08/23/24 1750 08/23/24 2002 08/24/24 0340 08/24/24 0454  BP: (!) 130/56 (!) 110/50 (!) 107/54   Pulse: 73 (!) 59 (!) 56   Resp:      Temp: 98.6 F (37 C) 98.3 F (36.8 C) 98.5 F (36.9 C)   TempSrc: Oral Oral Oral   SpO2: 100% 100% 100%   Weight:    89.4 kg  Height:         Intake/Output Summary (Last 24 hours) at 08/24/2024 0743 Last data filed at 08/24/2024 0454 Gross per 24 hour  Intake 3099.5 ml  Output 1500 ml  Net 1599.5 ml   Filed Weights   08/23/24 0351 08/23/24 1550 08/24/24 0454  Weight: 88.5 kg 88.5 kg 89.4 kg    Examination:  General exam: Appears calm and comfortable but lethargic but  more improved compared to yesterday Respiratory system: Clear to auscultation. Respiratory effort normal. Cardiovascular system: S1 & S2 heard, RRR. No JVD, murmurs, rubs, gallops or clicks. No pedal edema. Gastrointestinal system: Abdomen is nondistended, soft and nontender. No organomegaly or masses felt. Normal bowel sounds heard. Central nervous system: Not alert but oriented x 2 today.  Too weak to follow commands.  Data Reviewed: I have personally reviewed following labs and imaging studies  CBC: Recent Labs  Lab 08/18/24 0614 08/18/24 1223 08/19/24 0705 08/20/24 0500 08/21/24 0634 08/21/24 1130 08/22/24 0500 08/22/24 1618 08/23/24 0350  WBC 9.5  --  6.2 6.2 4.5  --  5.3  --  5.2  NEUTROABS 7.5  --  4.1  --  3.0  --  3.9  --  3.3  HGB 10.2*   < > 9.4* 9.2* 8.0* 8.6* 7.6* 8.0* 8.0*  HCT 31.7*   < > 29.5* 28.8* 25.3* 27.2* 24.0* 25.3* 25.3*  MCV 93.0  --  93.7 93.5 95.8  --  95.6  --  94.8  PLT 260  --  224 244 182  --  170  --  191   < > = values in this interval not displayed.   Basic Metabolic Panel: Recent Labs  Lab 08/19/24 0705 08/20/24 0500 08/21/24 0634 08/22/24 0500 08/23/24 0350  NA 140 137 138 136 136  K 4.2 4.4 4.4 4.6 4.4  CL 106 103 104 106 104  CO2 28 27 29 27 28   GLUCOSE 94 258* 199* 358* 229*  BUN 22 25* 22 21 20   CREATININE 0.35* 0.42* 0.39* 0.39* 0.35*  CALCIUM  8.6* 8.5* 8.1* 8.0* 8.1*  MG 2.1 2.1 2.1 1.9 1.9  PHOS 4.2 3.0 2.7 1.9* 2.1*   GFR: Estimated Creatinine Clearance: 76.7 mL/min (A) (by C-G formula based on SCr of 0.35 mg/dL (L)). Liver Function Tests: Recent Labs  Lab 08/18/24 0614  08/19/24 0705 08/21/24 0634 08/22/24 0500 08/23/24 0350  AST 38 40 55* 33 41  ALT 102* 104* 167* 64* 72*  ALKPHOS 127* 146* 178* 69 83  BILITOT 0.6 0.4 0.2 0.3 0.3  PROT 5.7* 5.4* 4.9* 4.2* 4.5*  ALBUMIN  2.4* 2.8* 2.8* 3.4* 3.2*   No results for input(s): LIPASE, AMYLASE in the last 168 hours. No results for input(s): AMMONIA in the last 168 hours. Coagulation Profile: Recent Labs  Lab 08/18/24 1223  INR 1.0   Cardiac Enzymes: No results for input(s): CKTOTAL, CKMB, CKMBINDEX, TROPONINI in the last 168 hours. BNP (last 3 results) Recent Labs    05/22/24 0630  PROBNP 3,514.0*   HbA1C: No results for input(s): HGBA1C in the last 72 hours. CBG: Recent Labs  Lab 08/23/24 1157 08/23/24 1748 08/23/24 1959 08/24/24 0038 08/24/24 0336  GLUCAP 196* 204* 163* 137* 144*   Lipid Profile: No results for input(s): CHOL, HDL, LDLCALC, TRIG, CHOLHDL, LDLDIRECT in the last 72 hours. Thyroid  Function Tests: No results for input(s): TSH, T4TOTAL, FREET4, T3FREE, THYROIDAB in the last 72 hours. Anemia Panel: No results for input(s): VITAMINB12, FOLATE, FERRITIN, TIBC, IRON , RETICCTPCT in the last 72 hours. Sepsis Labs: No results for input(s): PROCALCITON, LATICACIDVEN in the last 168 hours.  Recent Results (from the past 240 hours)  Culture, Urine (Do not remove urinary catheter, catheter placed by urology or difficult to place)     Status: Abnormal   Collection Time: 08/15/24 10:34 AM   Specimen: Urine, Catheterized  Result Value Ref Range Status   Specimen Description URINE, CATHETERIZED  Final  Special Requests   Final    NONE Performed at [facility], [address], [place], [place] [address]    Culture >=100,000 COLONIES/mL KLEBSIELLA PNEUMONIAE (A)  Final   Report Status 08/17/2024 FINAL  Final   Organism ID, Bacteria KLEBSIELLA PNEUMONIAE (A)  Final      Susceptibility   Klebsiella pneumoniae - MIC*     AMPICILLIN  >=32 RESISTANT Resistant     CEFAZOLIN  (URINE) Value in next row Resistant      RESISTANTThis is a modified FDA-approved test that has been validated and its performance characteristics determined by the reporting laboratory.  This laboratory is certified under the Clinical Laboratory Improvement Amendments CLIA as qualified to perform high complexity clinical laboratory testing.    CEFEPIME  Value in next row Sensitive      RESISTANTThis is a modified FDA-approved test that has been validated and its performance characteristics determined by the reporting laboratory.  This laboratory is certified under the Clinical Laboratory Improvement Amendments CLIA as qualified to perform high complexity clinical laboratory testing.    ERTAPENEM Value in next row Sensitive      RESISTANTThis is a modified FDA-approved test that has been validated and its performance characteristics determined by the reporting laboratory.  This laboratory is certified under the Clinical Laboratory Improvement Amendments CLIA as qualified to perform high complexity clinical laboratory testing.    CEFTRIAXONE  Value in next row Sensitive      RESISTANTThis is a modified FDA-approved test that has been validated and its performance characteristics determined by the reporting laboratory.  This laboratory is certified under the Clinical Laboratory Improvement Amendments CLIA as qualified to perform high complexity clinical laboratory testing.    CIPROFLOXACIN  Value in next row Sensitive      RESISTANTThis is a modified FDA-approved test that has been validated and its performance characteristics determined by the reporting laboratory.  This laboratory is certified under the Clinical Laboratory Improvement Amendments CLIA as qualified to perform high complexity clinical laboratory testing.    GENTAMICIN Value in next row Sensitive      RESISTANTThis is a modified FDA-approved test that has been validated and its performance  characteristics determined by the reporting laboratory.  This laboratory is certified under the Clinical Laboratory Improvement Amendments CLIA as qualified to perform high complexity clinical laboratory testing.    NITROFURANTOIN Value in next row Intermediate      RESISTANTThis is a modified FDA-approved test that has been validated and its performance characteristics determined by the reporting laboratory.  This laboratory is certified under the Clinical Laboratory Improvement Amendments CLIA as qualified to perform high complexity clinical laboratory testing.    TRIMETH /SULFA  Value in next row Sensitive      RESISTANTThis is a modified FDA-approved test that has been validated and its performance characteristics determined by the reporting laboratory.  This laboratory is certified under the Clinical Laboratory Improvement Amendments CLIA as qualified to perform high complexity clinical laboratory testing.    AMPICILLIN /SULBACTAM Value in next row Resistant      RESISTANTThis is a modified FDA-approved test that has been validated and its performance characteristics determined by the reporting laboratory.  This laboratory is certified under the Clinical Laboratory Improvement Amendments CLIA as qualified to perform high complexity clinical laboratory testing.    PIP/TAZO Value in next row Sensitive      16 SENSITIVEThis is a modified FDA-approved test that has been validated and its performance characteristics determined by the reporting laboratory.  This laboratory is certified under  the Clinical Laboratory Improvement Amendments CLIA as qualified to perform high complexity clinical laboratory testing.    MEROPENEM  Value in next row Sensitive      16 SENSITIVEThis is a modified FDA-approved test that has been validated and its performance characteristics determined by the reporting laboratory.  This laboratory is certified under the Clinical Laboratory Improvement Amendments CLIA as qualified to  perform high complexity clinical laboratory testing.    * >=100,000 COLONIES/mL KLEBSIELLA PNEUMONIAE  Culture, blood (Routine X 2) w Reflex to ID Panel     Status: None   Collection Time: 08/15/24 12:41 PM   Specimen: BLOOD RIGHT HAND  Result Value Ref Range Status   Specimen Description BLOOD RIGHT HAND  Final   Special Requests   Final    BOTTLES DRAWN AEROBIC AND ANAEROBIC Blood Culture adequate volume   Culture   Final    NO GROWTH 5 DAYS Performed at [facility], [address] [place], [place] [address]    Report Status 08/20/2024 FINAL  Final  Culture, blood (Routine X 2) w Reflex to ID Panel     Status: None   Collection Time: 08/15/24 12:41 PM   Specimen: BLOOD LEFT HAND  Result Value Ref Range Status   Specimen Description BLOOD LEFT HAND  Final   Special Requests   Final    BOTTLES DRAWN AEROBIC AND ANAEROBIC Blood Culture adequate volume   Culture   Final    NO GROWTH 5 DAYS Performed at [facility], [address] [place], [place] [address]    Report Status 08/20/2024 FINAL  Final     Radiology Studies: No results found.  Scheduled Meds:  amLODipine   10 mg Per Tube Daily   atorvastatin   80 mg Per Tube QHS   Chlorhexidine  Gluconate Cloth  6 each Topical Daily   free water   75 mL Per Tube Q4H   gabapentin   100 mg Per Tube Q8H   insulin  aspart  0-9 Units Subcutaneous Q4H   insulin  glargine  15 Units Subcutaneous BID   levETIRAcetam   500 mg Intravenous Q12H   lidocaine   1 patch Transdermal Daily   mouth rinse  15 mL Mouth Rinse 4 times per day   oxyCODONE   2.5 mg Per Tube Q8H   pantoprazole  (PROTONIX ) IV  40 mg Intravenous Q12H   propranolol   20 mg Per Tube TID   sodium chloride  flush  10-40 mL Intracatheter Q12H   Continuous Infusions:  citrate dextrose      feeding supplement (GLUCERNA 1.5 CAL) 45 mL/hr at 08/24/24 0400     LOS: 38 days   Fredia Skeeter, MD [facility]  08/24/2024, 7:43 AM   *Please note that this is a verbal  dictation therefore any spelling or grammatical errors are due to the Dragon Medical One system interpretation.  Please page via Amion and do not message via secure chat for urgent patient care matters. Secure chat can be used for non urgent patient care matters.  How to contact the [facility] Attending or Consulting provider 7A - 7P or covering provider during after hours 7P -7A, for this patient?  Check the care team in [facility] and look for a) attending/consulting [facility] provider listed and b) the [facility] team listed. Page or secure chat 7A-7P. Log into [email or web address] and use [facility]'s universal password to access. If you do not have the password, please contact the hospital operator. Locate the [facility] provider you are looking for under [facility] and page to  a number that you can be directly reached. If you still have difficulty reaching the provider, please page the [facility] (Director on Call) for the Hospitalists listed on amion for assistance.  "

## 2024-08-24 NOTE — Progress Notes (Signed)
 PT Discharge Note  Patient Details Name: Brenda [name] MRN: [id number] DOB: 10/24/1960   Cancelled Treatment:    Reason Eval/Treat Not Completed: Other (comment)  Spoke with pt's RN as pt had been screaming out in pain intermittently throughout the day. RN is communicating with med team to try to get pain meds adjusted. Stopped in to discuss discharge from PT with patient's husband. He was understanding. Educated that if pt becomes alert and able to participate with PT, MD can re-order PT.   PT Discharge Note  Patient is being discharged from PT services secondary to:  Lack of progress and medical decline.  Please see latest Therapy Progress Note for current level of functioning and progress toward goals.  Progress and discharge plan and discussed with caregiver and they  Agree   Macario RAMAN, PT Acute Rehabilitation Services  Office [phone]   Macario SHAUNNA Soja 08/24/2024, 3:22 PM

## 2024-08-24 NOTE — Progress Notes (Signed)
 Nutrition Follow-up  DOCUMENTATION CODES:  Non-severe (moderate) malnutrition in context of chronic illness  INTERVENTION:  Continue the following tube feeding via cortrak: Glucerna 1.5 at 45 ml/h (1080 ml per day) Free water : 75mL every 4 hours per MD Provides 1620 kcal, 89 gm protein, 820 ml free water  daily ( fee water  TF+flush) Continue current diet as ordered, encourage PO intake Monitor GOC discussions with PMT  NUTRITION DIAGNOSIS:  Moderate Malnutrition related to chronic illness (cancer) as evidenced by percent weight loss, mild muscle depletion, moderate muscle depletion (12% x 6 months). - remains applicable  GOAL:  Patient will meet greater than or equal to 90% of their needs - being addressed with tube feeds  MONITOR:  Weight trends, TF tolerance, I & O's, Labs, Diet advancement  REASON FOR ASSESSMENT:  Consult Enteral/tube feeding initiation and management  ASSESSMENT:  Pt with hx of HTN, HLD, CAD, CHF, hx CVA, uterine cancer s/p hysterectomy and adjuvant radiation therapy in 2023 with recurrance with mets in July 2025, hx MI x 3, and DM type 1 presented to [facility] ED with weakness after a recent admission from 11/9-11/11. Pt had seizures during admission and was transferred to [facility] for continuous EEG.  11/13 - presented to [facility] with chills and weakness after recent admission to [facility] 11/15 - transferred to [facility] 11/19 - cortrak placed (gastric) 12/2 - SLP BSE, NPO 12/4 - MBS, DYS1, thin liquids 12/9 - SLP BSE, regular diet to promote PO intake 12/16 - TF on hold for GI bleed, changed to glucerna 1.5 when able to restart 12/19 - first PLEX treatment (planned for 5)  Pt resting in bed at the time of assessment sleeping soundly. Husband at bedside able to provide a nutrition hx since last assessment. Tolerating TF, having Bms. Husband is encouraged by progress with PLEX so far. States that pt's rigidity has seemed to improved.    Will leave enteral feeds the same for now. Husband did express some concern with pt not having enough Bms, looks like pt having 1/d for the last few days. Has a prn bowel regimen. Discussed concerns with RN.   Admit weight: 77.9 kg Current weight: 89.4 kg   12% weight loss noted in the last 6 months (since cancer recurrence) which is severe for timeframe  Nutritionally Relevant Medications: Scheduled Meds:  atorvastatin   80 mg Per Tube QHS   free water   75 mL Per Tube Q4H   insulin  aspart  0-9 Units Subcutaneous Q4H   insulin  glargine  15 Units Subcutaneous BID   levETIRAcetam   500 mg Intravenous Q12H   pantoprazole  IV  40 mg Intravenous Q12H   Continuous Infusions:  citrate dextrose      feeding supplement (GLUCERNA 1.5 CAL) 45 mL/hr at 08/24/24 0400   PRN Meds: diphenhydrAMINE , ondansetron , prochlorperazine , senna-docusate  Labs Reviewed: CBG ranges from 137-204 mg/dL over the last 24 hours HgbA1c 7.2% (06/01/24)  NUTRITION - FOCUSED PHYSICAL EXAM: Flowsheet Row Most Recent Value  Orbital Region No depletion  Upper Arm Region No depletion  Thoracic and Lumbar Region No depletion  Buccal Region No depletion  Temple Region Mild depletion  Clavicle Bone Region Mild depletion  Clavicle and Acromion Bone Region Mild depletion  Scapular Bone Region Mild depletion  Dorsal Hand No depletion  Patellar Region Moderate depletion  Anterior Thigh Region Moderate depletion  Posterior Calf Region Moderate depletion  Edema (RD Assessment) Moderate  [BLE, BUE]  Hair Reviewed  Eyes Reviewed  Mouth Reviewed  Skin Reviewed  Nails Reviewed    Diet Order:   Diet Order             Diet regular Room service appropriate? Yes with Assist; Fluid consistency: Thin  Diet effective now                   EDUCATION NEEDS:  Education needs have been addressed  Skin:  Skin Assessment: Reviewed RN Assessment DTI: - Sacrum Stage 2:  - medial vertebral column  Last BM:  12/22 -  type 6  Height:  Ht Readings from Last 1 Encounters:  08/21/24 5' 2 (1.575 m)    Weight:  Wt Readings from Last 1 Encounters:  08/24/24 89.4 kg    Ideal Body Weight:  50 kg  BMI:  Body mass index is 36.03 kg/m.  Estimated Nutritional Needs:  Kcal:  1600-1800 kcal/d Protein:  75-90 g/d Fluid:  >/=1.6L/d    Brenda [name], RD, LDN, CNSC Registered Dietitian II Please reach out via secure chat

## 2024-08-24 NOTE — Progress Notes (Signed)
 NEUROLOGY CONSULT FOLLOW UP NOTE   Date of service: August 24, 2024 Patient Name: Brenda [name] MRN:  [id number] DOB:  29-Jan-1961  Interval Hx/subjective   Daughter at the bedside, she states her mom just received pain medication.  Patient sleeping on arrival to room.  She does not answer questions for me but she does state ow, ow, ow with passive movement of her extremities   Vitals   Vitals:   08/23/24 2002 08/24/24 0340 08/24/24 0454 08/24/24 0806  BP: (!) 110/50 (!) 107/54  (!) 143/46  Pulse: (!) 59 (!) 56  73  Resp:    16  Temp: 98.3 F (36.8 C) 98.5 F (36.9 C)  98.4 F (36.9 C)  TempSrc: Oral Oral  Oral  SpO2: 100% 100%  100%  Weight:   89.4 kg   Height:         Physical Exam   Mental Status: Patient is sleeping, does not answer questions for me ranial Nerves: II: blinks to threat bilaterally. Pupils are unequal, left is larger and less reactive, documented repeatedly in previous notes as chronic.  III,IV, VI: eyes are midline, but her head is turned to the right.  Motor: Tone is increased throughout. Daughter states this is improved from yesterday.  Sensory: She responds to nox stim x 4.     Medications  Current Facility-Administered Medications:    amLODipine  (NORVASC ) tablet 10 mg, 10 mg, Per Tube, Daily, Drusilla, Sabas RAMAN, MD, 10 mg at 08/23/24 9175   artificial tears (LACRILUBE) ophthalmic ointment, , Both Eyes, Q4H PRN, Tariq, Hassan, MD, Given at 08/11/24 2102   atorvastatin  (LIPITOR ) tablet 80 mg, 80 mg, Per Tube, QHS, Sigdel, Santosh, MD, 80 mg at 08/23/24 2218   Chlorhexidine  Gluconate Cloth 2 % PADS 6 each, 6 each, Topical, Daily, Chavez, Abigail, NP, 6 each at 08/23/24 0850   citrate dextrose  (ACD-A  anticoagulant) solution 1,000 mL, 1,000 mL, Other, Continuous, Michaela Aisha SQUIBB, MD, 1,000 mL at 08/23/24 1539   diphenhydrAMINE  (BENADRYL ) 12.5 MG/5ML elixir 25 mg, 25 mg, Oral, Q6H PRN, Perri DELENA Meliton Mickey., MD, 25 mg at 08/23/24 2217    diphenhydrAMINE  (BENADRYL ) capsule 25 mg, 25 mg, Oral, Q6H PRN, Michaela Aisha SQUIBB, MD, 25 mg at 08/23/24 1452   feeding supplement (GLUCERNA 1.5 CAL) liquid 1,000 mL, 1,000 mL, Per Tube, Continuous, Perri DELENA Meliton Mickey., MD, Last Rate: 45 mL/hr at 08/24/24 0400, Infusion Verify at 08/24/24 0400   free water  75 mL, 75 mL, Per Tube, Q4H, Cosette Blackwater, MD, 75 mL at 08/24/24 9157   gabapentin  (NEURONTIN ) 250 MG/5ML solution 100 mg, 100 mg, Per Tube, Q8H, Perri DELENA Meliton Mickey., MD, 100 mg at 08/24/24 9379   guaiFENesin  (ROBITUSSIN) 100 MG/5ML liquid 15 mL, 15 mL, Per Tube, Q6H PRN, Drusilla Sabas RAMAN, MD, 15 mL at 08/23/24 0825   HYDROmorphone  (DILAUDID ) injection 0.5 mg, 0.5 mg, Intravenous, Q2H PRN, 0.5 mg at 08/24/24 0133 **OR** [DISCONTINUED] HYDROmorphone  (DILAUDID ) injection 1 mg, 1 mg, Intravenous, Q2H PRN, Perri DELENA Meliton Mickey., MD   insulin  aspart (novoLOG ) injection 0-9 Units, 0-9 Units, Subcutaneous, Q4H, Perri DELENA Meliton Mickey., MD, 2 Units at 08/24/24 9149   insulin  glargine (LANTUS ) injection 15 Units, 15 Units, Subcutaneous, BID, Pahwani, Ravi, MD, 15 Units at 08/23/24 2218   levETIRAcetam  (KEPPRA ) undiluted injection 500 mg, 500 mg, Intravenous, Q12H, Howerter, Justin B, DO, 500 mg at 08/23/24 2219   lidocaine  (LIDODERM ) 5 % 1 patch, 1 patch, Transdermal, Daily, Cheryle, Kshitiz, MD, 1 patch at  08/23/24 0823   LORazepam  (ATIVAN ) injection 1 mg, 1 mg, Intravenous, Q4H PRN, Perri DELENA Meliton Mickey., MD, 1 mg at 08/24/24 9167   naloxone  (NARCAN ) injection 0.4 mg, 0.4 mg, Intravenous, PRN, Alfornia Madison, MD   ondansetron  (ZOFRAN ) tablet 4 mg, 4 mg, Per Tube, Q6H PRN **OR** ondansetron  (ZOFRAN ) injection 4 mg, 4 mg, Intravenous, Q6H PRN, Cosette Blackwater, MD   Oral care mouth rinse, 15 mL, Mouth Rinse, 4 times per day, Perri DELENA Meliton Mickey., MD, 15 mL at 08/24/24 9157   Oral care mouth rinse, 15 mL, Mouth Rinse, PRN, Perri DELENA Meliton Mickey., MD, 15 mL at 08/21/24 1145   oxyCODONE  (Oxy  IR/ROXICODONE ) immediate release tablet 2.5 mg, 2.5 mg, Per Tube, Q8H, 2.5 mg at 08/24/24 0620 **AND** [DISCONTINUED] oxyCODONE  (Oxy IR/ROXICODONE ) immediate release tablet 2.5 mg, 2.5 mg, Oral, Q4H PRN, Perri DELENA Meliton Mickey., MD, 2.5 mg at 08/20/24 1122   oxyCODONE  (Oxy IR/ROXICODONE ) immediate release tablet 2.5 mg, 2.5 mg, Oral, Q4H PRN **OR** oxyCODONE  (Oxy IR/ROXICODONE ) immediate release tablet 5 mg, 5 mg, Oral, Q4H PRN, Perri DELENA Meliton Mickey., MD, 5 mg at 08/24/24 9167   pantoprazole  (PROTONIX ) injection 40 mg, 40 mg, Intravenous, Q12H, Perri DELENA Meliton Mickey., MD, 40 mg at 08/23/24 2217   prochlorperazine  (COMPAZINE ) injection 10 mg, 10 mg, Intravenous, Q6H PRN, Howerter, Justin B, DO   propranolol  (INDERAL ) tablet 20 mg, 20 mg, Per Tube, TID, Sigdel, Santosh, MD, 20 mg at 08/23/24 2218   senna-docusate (Senokot-S) tablet 1 tablet, 1 tablet, Oral, QHS PRN, Foust, Katy L, NP, 1 tablet at 08/23/24 1744   sodium chloride  flush (NS) 0.9 % injection 10-40 mL, 10-40 mL, Intracatheter, Q12H, Sigdel, Santosh, MD, 10 mL at 08/23/24 2219   sodium chloride  flush (NS) 0.9 % injection 10-40 mL, 10-40 mL, Intracatheter, PRN, Mcarthur Pick, MD  Labs and Diagnostic Imaging   CBC:  Recent Labs  Lab 08/22/24 0500 08/22/24 1618 08/23/24 0350  WBC 5.3  --  5.2  NEUTROABS 3.9  --  3.3  HGB 7.6* 8.0* 8.0*  HCT 24.0* 25.3* 25.3*  MCV 95.6  --  94.8  PLT 170  --  191    Basic Metabolic Panel:  Lab Results  Component Value Date   NA 136 08/23/2024   K 4.4 08/23/2024   CO2 28 08/23/2024   GLUCOSE 229 (H) 08/23/2024   BUN 20 08/23/2024   CREATININE 0.35 (L) 08/23/2024   CALCIUM  8.1 (L) 08/23/2024   GFRNONAA >60 08/23/2024   GFRAA 94 12/16/2019   Lipid Panel:  Lab Results  Component Value Date   LDLCALC 28 05/30/2024   HgbA1c:  Lab Results  Component Value Date   HGBA1C 7.2 (H) 06/01/2024   Urine Drug Screen:     Component Value Date/Time   LABOPIA POSITIVE (A) 04/20/2024 1817    COCAINSCRNUR NONE DETECTED 04/20/2024 1817   LABBENZ NONE DETECTED 04/20/2024 1817   AMPHETMU NONE DETECTED 04/20/2024 1817   THCU NONE DETECTED 04/20/2024 1817   LABBARB NONE DETECTED 04/20/2024 1817    Alcohol Level     Component Value Date/Time   ETH <15 04/20/2024 1344   INR  Lab Results  Component Value Date   INR 1.0 08/18/2024   APTT  Lab Results  Component Value Date   APTT 67 (H) 06/21/2024   Imaging personally reviewed MRI Brain(Personally reviewed): 1. Similar evolving infarcts in the anterior and posterior circulation. 2. No new infarcts, progressive mass effect or acute hemorrhage.  Assessment  Tashi Andujo is a 63 y.o. 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  Cervical spine imaging also unremarkable for acute process.  At this time, stiffness on her exam coupled with altered mental status is of unclear etiology. Possibilities include side effect/neurotoxicity from the PD-1 inhibitor pembrolizumab , which is being used to treat her cancer, other paraneoplastic syndrome including possibly GAD65 related stiff person syndrome. Her exam with waxy flexibility and decreased but intermittent verbalization could have been consistent with catatonia, but the time course would be unsual and lack of response to Ativan  makes this much less likely.  Steroids have been tried with no improvement and PLEX has been discussed in case this is an irAE associated with keytruda , or some other antibody drive autoimmune process.   Her CSF results do not favor acute inflammatory demyelinating  polyneuropathy/Guillain-Barr type of pathology, nor does her clinical  presentation/exam.   Recommendations  3rd Plasmapheresis tomorrow, then every other day x 5 days total F/u paraneoplastic panel Neurology will follow.  ______________________________________________________________________  Patient seen and examined by NP/APP.   Jorene Last, DNP, FNP-BC [facility] Pager: [phone]  Electronically signed: Dr. Makhi Muzquiz

## 2024-08-25 DIAGNOSIS — F418 Other specified anxiety disorders: Secondary | ICD-10-CM | POA: Diagnosis not present

## 2024-08-25 DIAGNOSIS — Z515 Encounter for palliative care: Secondary | ICD-10-CM | POA: Diagnosis not present

## 2024-08-25 DIAGNOSIS — T45AX4A Poisoning by immune checkpoint inhibitors and immunostimulant drugs, undetermined, initial encounter: Secondary | ICD-10-CM | POA: Diagnosis not present

## 2024-08-25 DIAGNOSIS — G893 Neoplasm related pain (acute) (chronic): Secondary | ICD-10-CM | POA: Diagnosis not present

## 2024-08-25 DIAGNOSIS — G9341 Metabolic encephalopathy: Secondary | ICD-10-CM | POA: Diagnosis not present

## 2024-08-25 DIAGNOSIS — T45AX5A Adverse effect of immune checkpoint inhibitors and immunostimulant drugs, initial encounter: Secondary | ICD-10-CM | POA: Diagnosis not present

## 2024-08-25 DIAGNOSIS — R4182 Altered mental status, unspecified: Secondary | ICD-10-CM | POA: Diagnosis not present

## 2024-08-25 LAB — CBC WITH DIFFERENTIAL/PLATELET
Abs Immature Granulocytes: 0.03 K/uL (ref 0.00–0.07)
Basophils Absolute: 0 K/uL (ref 0.0–0.1)
Basophils Relative: 0 %
Eosinophils Absolute: 0.1 K/uL (ref 0.0–0.5)
Eosinophils Relative: 2 %
HCT: 28.3 % — ABNORMAL LOW (ref 36.0–46.0)
Hemoglobin: 9.1 g/dL — ABNORMAL LOW (ref 12.0–15.0)
Immature Granulocytes: 1 %
Lymphocytes Relative: 23 %
Lymphs Abs: 1.3 K/uL (ref 0.7–4.0)
MCH: 31.2 pg (ref 26.0–34.0)
MCHC: 32.2 g/dL (ref 30.0–36.0)
MCV: 96.9 fL (ref 80.0–100.0)
Monocytes Absolute: 0.3 K/uL (ref 0.1–1.0)
Monocytes Relative: 5 %
Neutro Abs: 3.8 K/uL (ref 1.7–7.7)
Neutrophils Relative %: 69 %
Platelets: 198 K/uL (ref 150–400)
RBC: 2.92 MIL/uL — ABNORMAL LOW (ref 3.87–5.11)
RDW: 20.6 % — ABNORMAL HIGH (ref 11.5–15.5)
WBC: 5.5 K/uL (ref 4.0–10.5)
nRBC: 0 % (ref 0.0–0.2)

## 2024-08-25 LAB — GLUCOSE, CAPILLARY
Glucose-Capillary: 91 mg/dL (ref 70–99)
Glucose-Capillary: 168 mg/dL — ABNORMAL HIGH (ref 70–99)
Glucose-Capillary: 169 mg/dL — ABNORMAL HIGH (ref 70–99)
Glucose-Capillary: 179 mg/dL — ABNORMAL HIGH (ref 70–99)
Glucose-Capillary: 158 mg/dL — ABNORMAL HIGH (ref 70–99)
Glucose-Capillary: 163 mg/dL — ABNORMAL HIGH (ref 70–99)

## 2024-08-25 LAB — BASIC METABOLIC PANEL WITH GFR
Anion gap: 7 (ref 5–15)
BUN: 13 mg/dL (ref 8–23)
CO2: 27 mmol/L (ref 22–32)
Calcium: 8.4 mg/dL — ABNORMAL LOW (ref 8.9–10.3)
Chloride: 103 mmol/L (ref 98–111)
Creatinine, Ser: 0.33 mg/dL — ABNORMAL LOW (ref 0.44–1.00)
GFR, Estimated: >60 mL/min
Glucose, Bld: 179 mg/dL — ABNORMAL HIGH (ref 70–99)
Potassium: 4.5 mmol/L (ref 3.5–5.1)
Sodium: 137 mmol/L (ref 135–145)

## 2024-08-25 LAB — MISC LABCORP TEST (SEND OUT): Labcorp test code: 505535

## 2024-08-25 MED ORDER — CALCIUM GLUCONATE-NACL 2-0.675 GM/100ML-% IV SOLN
2.0000 g | Freq: Once | INTRAVENOUS | Status: AC
  Administered 2024-08-25: 2000 mg via INTRAVENOUS
  Filled 2024-08-25 (×2): qty 100

## 2024-08-25 MED ORDER — ACD FORMULA A 0.73-2.45-2.2 GM/100ML VI SOLN
1000.0000 mL | Status: DC
  Administered 2024-08-25: 1000 mL
  Filled 2024-08-25 (×2): qty 1000

## 2024-08-25 MED ORDER — CALCIUM GLUCONATE-NACL 2-0.675 GM/100ML-% IV SOLN
INTRAVENOUS | Status: AC
  Filled 2024-08-25: qty 100

## 2024-08-25 MED ORDER — CALCIUM CARBONATE ANTACID 500 MG PO CHEW
2.0000 | CHEWABLE_TABLET | ORAL | Status: AC
  Administered 2024-08-25 (×2): 400 mg via ORAL
  Filled 2024-08-25 (×2): qty 2

## 2024-08-25 MED ORDER — ACETAMINOPHEN 325 MG PO TABS
650.0000 mg | ORAL_TABLET | ORAL | Status: DC | PRN
  Administered 2024-08-25 – 2024-08-27 (×2): 650 mg via ORAL
  Filled 2024-08-25: qty 2

## 2024-08-25 MED ORDER — SODIUM CHLORIDE 0.9 % IV SOLN: Freq: Once | INTRAVENOUS | Status: DC

## 2024-08-25 MED ORDER — DIPHENHYDRAMINE HCL 25 MG PO CAPS: 25.0000 mg | ORAL_CAPSULE | Freq: Four times a day (QID) | ORAL | Status: DC | PRN

## 2024-08-25 MED ORDER — ANTICOAGULANT SODIUM CITRATE 4% (200MG/5ML) IV SOLN
5.0000 mL | Freq: Once | Status: AC
  Administered 2024-08-25: 5 mL
  Filled 2024-08-25: qty 5

## 2024-08-25 MED ORDER — SODIUM CHLORIDE 0.9 % IV SOLN
INTRAVENOUS | Status: AC
  Filled 2024-08-25 (×4): qty 200

## 2024-08-25 NOTE — Progress Notes (Signed)
 Occupational Therapy Treatment Patient Details Name: Brenda [name] MRN: [id number] DOB: 08/09/1961 Today's Date: 08/25/2024   History of present illness 63 yo female who returned to hospital on 07/15/2024 with ongoing signs and symptoms of UTI with generalized weakness. Pt with 4 seizures on EEG 11/16. Working diagnosis of cefepime  neurotoxicity. Prior admit 11/9 with urinary symptoms and AMS d/c home on OP ABX. PMH includes but is not limited to: HTN, HLD, DM, depression/anxiety, CVA (CIR recently), DVT/PE, OSA on CPAP, chronic respiratory failure on 2 L/min at baseline, and metastatic endometrial cancer with chronic cancer-related pain.   OT comments  Patient seen for ROM to BUE and caregiver training.  Patient's husband present and education provided for ROM and retrograde massage to address edema in BUE hands. Patient appears to have decreased tone in LUE then right and more pain with ROM in right.  OT to continue to follow to address ROM and monitor for needs for BUE splinting. Patient will benefit from continued inpatient follow up therapy, <3 hours/day.       If plan is discharge home, recommend the following:  Two people to help with walking and/or transfers;Two people to help with bathing/dressing/bathroom;Assistance with cooking/housework;Assistance with feeding;Direct supervision/assist for medications management;Direct supervision/assist for financial management;Assist for transportation;Help with stairs or ramp for entrance;Supervision due to cognitive status   Equipment Recommendations  Hospital bed;Hoyer lift    Recommendations for Other Services      Precautions / Restrictions Precautions Precautions: Fall Recall of Precautions/Restrictions: Impaired Precaution/Restrictions Comments: Cortrak, skin integrity       Mobility Bed Mobility                    Transfers                         Balance                                            ADL either performed or assessed with clinical judgement   ADL Overall ADL's : Needs assistance/impaired                                       General ADL Comments: total care    Extremity/Trunk Assessment              Vision       Perception     Praxis     Communication Communication Communication: Impaired Factors Affecting Communication: Difficulty expressing self   Cognition Arousal: Obtunded Behavior During Therapy: Flat affect Cognition: Difficult to assess Difficult to assess due to: Level of arousal                             Following commands: Impaired Following commands impaired: Follows one step commands inconsistently      Cueing   Cueing Techniques: Verbal cues, Tactile cues  Exercises Exercises: General Upper Extremity General Exercises - Upper Extremity Shoulder Flexion: Both, Supine, 10 reps, PROM Shoulder ABduction: Both, 10 reps, Supine Shoulder Horizontal ABduction: PROM, Both, 10 reps, Supine Elbow Flexion: PROM, Both, Supine, 10 reps Elbow Extension: PROM, Both, Supine, 10 reps Wrist Flexion: PROM, Both, Supine, 10 reps Wrist Extension: PROM, Both, Supine, 10 reps Digit  Composite Flexion: PROM, Both, Supine, 10 reps Composite Extension: PROM, Both, Supine, 10 reps    Shoulder Instructions       General Comments Husband present and educated on ROM and retrograde massage    Pertinent Vitals/ Pain       Pain Assessment Pain Assessment: Faces Faces Pain Scale: Hurts little more Pain Location: BUE with ROM Pain Descriptors / Indicators: Grimacing, Moaning Pain Intervention(s): Limited activity within patient's tolerance, Monitored during session, Premedicated before session, Repositioned  Home Living                                          Prior Functioning/Environment              Frequency  Min 1X/week        Progress Toward Goals  OT Goals(current goals  can now be found in the care plan section)  Progress towards OT goals: Progressing toward goals     Plan      Co-evaluation                 AM-PAC OT 6 Clicks Daily Activity     Outcome Measure   Help from another person eating meals?: Total Help from another person taking care of personal grooming?: Total Help from another person toileting, which includes using toliet, bedpan, or urinal?: Total Help from another person bathing (including washing, rinsing, drying)?: Total Help from another person to put on and taking off regular upper body clothing?: Total Help from another person to put on and taking off regular lower body clothing?: Total 6 Click Score: 6    End of Session Equipment Utilized During Treatment: Oxygen   OT Visit Diagnosis: Muscle weakness (generalized) (M62.81);Low vision, both eyes (H54.2);Feeding difficulties (R63.3);Cognitive communication deficit (R41.841) Symptoms and signs involving cognitive functions: Cerebral infarction   Activity Tolerance Patient limited by lethargy   Patient Left in bed;with call bell/phone within reach;with bed alarm set;with family/visitor present   Nurse Communication Mobility status        Time: 1326-1401 OT Time Calculation (min): 35 min  Charges: OT General Charges $OT Visit: 1 Visit OT Treatments $Therapeutic Exercise: 23-37 mins  Dick [name], OTA Acute Rehabilitation Services  Office [phone]   Brenda [name] 08/25/2024, 2:43 PM

## 2024-08-25 NOTE — Progress Notes (Signed)
 " PROGRESS NOTE    Brenda [name]  FMW:[id number] DOB: 06/17/1961 DOA: 07/15/2024 PCP: [facility]   Brief Narrative:  63 year old female with history of metastatic endometrial cancer, chronic cancer-related pain, hypertension, hyperlipidemia, diabetes mellitus, CVA, history of DVT and PE anticoagulated on Lovenox , OSA, chronic hypoxic respiratory failure on 2L via nasal cannula and recent hospitalization and discharged on 07/14/2024 for UTI treated with IV antibiotics followed by oral antibiotics on discharge presented with chills and weakness.  She was found to have UTI and was started on IV cefepime .  Unfortunately mental status worsened and she was transferred to [facility] for continuous EEG monitoring with 4 seizures noted on video EEG. She was on Keppra  11/16. Placed on empiric broad-spectrum antibiotics for possible meningitis/encephalitis which was ruled out based on CSF results. .    Neurology has been reconsulted 12/7 with development of rigidity.  Oncology has been reconsulted.  Family has begun to discuss palliative care, but at this point managing her cancer related pain and working on rigidity with neurology.  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 4 seizures noted 11/16. S/p LP, meningitis ruled out Her  husband notes worsening stiffness/rigidity/inability to move extremities starting about 1 week ago. EEG without epileptiform abnormalities. head CT without acute intracranial abnormality. MRI brain with evolving infarcts - MRI C spine without evidence of metastatic disease.  Remains on Keppra .  Neurology managing. appreciate neurology recommendations - ddx includes PD-1 inhibitor induced side effect, paraneoplatic syndrome, stiff person syndrome.  Considering catatonia.  We've stopped prozac  (low dose, lower suspicion for serotonin syndrome?).  S/p high dose steroids 12/9-11.  S/p trial sinimet.  S/p baclofen .  Follow paraneoplastic panel, GAD65 ab.  S/p PLEX #1 on 12/19 and #2 on 08/23/2024, per neurology, plan for total 5 days of treatment.   Acute Metabolic Encephalopathy Intermittently encephalopathic. Delirium precautions  issues with somnolence on fentanyl , will balance need for opiates with sedation    Anemia / Bright Red Blood Per Rectum BRBPR resolved at this time - anticoagulation on hold  CT GI bleed study without evidence GI bleed. IV PPI BID S/p 1 unit pRBC 12/1 - S/p IV iron . Normal folate, b12.  Normal iron , ferritin.  Hemoglobin fairly stable.   Klebsiella Pneumoniae UTI Treated with abx for UTI - s/p 5 day course Blood cultures NGx2   Metastatic Endometrial Cancer / Cancer Related Pain  Appreciate oncology consult  CT chest/abdomen/pelvis for persistent (worsening?) back pain (note centrally necrotic infiltrative mass in L psoas in 06/2024) CT 12/7 with R psoas muscle mass like enlargement measuring 5.0 x 4.6 cm indeterminate for tumor vs hematoma  - L psoas/paraspinal mass decreased in size.   CT 12/16 with R psoas muscle hematoma (discussed with IR 12/19, who noted nothing to drain), L2 sclerotic changes with L paraspinal  masslike tissue at L2 concerning for neoplastic process Appreciate oncology needs - last saw 08/24/2024. Appreciate palliative care for adjusting the pain  medications.   Elevated LFT's Monitor - relatively mild, will trend for now   Dysphagia Cortrak + tube feeds - eventually decision regarding PEG will need to be made, but will hold off for now with our trial of plex   Depression Anxiety Prozac  discontinued with rigidity   Pulmonary Embolism Splenic Infarct Lovenox  on hold with GI bleed and psoas hematoma above   CAD  Hx Stroke Lovenox  on hold   Hypernatremia Resolved   Abnormal Thyroid  Function Tests Repeat TSH and free T4 wnl Negative TSI On propanolol    T2DM Currently on Lantus  15 units twice daily and SSI, blood sugar fairly controlled.  Goals of Care Brenda [name] and her husband beginning to talk about palliative care on the day I met them (12/7).  I had a conversation with them about comfort focused care and hospice, but at this point, we're trying to work up and treat the acute issues below (with regards to her rigidity and encephalopathy).  At this point in time, husband and the family are hoping to trial PLEX.  Will do our best to keep her as comfortable as possible as we pursue this treatment option with neurology assistance.  Per palliative, husband asking about transfer if she's not improved with this but not sure if he says that when he is emotional or not.  Will address once 5 days of Plex is completed.  Appreciate palliative care following this patient.  DVT prophylaxis: Place and maintain sequential compression device Start: 08/02/24 0738   Code Status: Limited: Do not attempt resuscitation (DNR) -DNR-LIMITED -Do Not Intubate/DNI   Family Communication: Daughter present at bedside.   Status is: Inpatient Remains inpatient appropriate because: Encephalopathic   Estimated body mass index is 36.03 kg/m as calculated from the following:   Height as of this encounter: 5' 2 (1.575 m).   Weight as of this encounter: 89.4 kg.  Wound 07/25/24 1100 Pressure Injury Sacrum Medial Deep Tissue Pressure Injury -  Purple or maroon localized area of discolored intact skin or blood-filled blister due to damage of underlying soft tissue from pressure and/or shear. (Active)     Wound 08/11/24 0415 Pressure Injury Vertebral column Medial Stage 2 -  Partial thickness loss of dermis presenting as a shallow open injury with a red, pink wound bed without slough. (Active)   Nutritional Assessment: Body mass index is 36.03 kg/m.SABRA Seen by dietician.  I agree with the assessment and plan as outlined below: Nutrition Status: Nutrition Problem: Moderate Malnutrition Etiology: chronic illness (cancer) Signs/Symptoms: percent weight loss, mild muscle depletion, moderate muscle depletion (12% x 6 months) Percent weight loss: 12 % Interventions: Prostat, Tube feeding, MVI  . Skin Assessment: I have examined the patient's skin and I agree with the wound assessment as performed by the wound care RN as outlined below: Wound 07/25/24 1100 Pressure Injury Sacrum Medial Deep Tissue Pressure Injury - Purple or maroon localized area of discolored intact skin or blood-filled blister due to damage of underlying soft tissue from pressure and/or shear. (Active)     Wound 08/11/24 0415 Pressure Injury Vertebral column Medial Stage 2 -  Partial thickness loss of dermis presenting as a shallow open injury with a red, pink wound bed without slough. (Active)    Consultants:  Neurology, palliative care  Procedures:  As above  Antimicrobials:  Anti-infectives (From admission, onward)  Start     Dose/Rate Route Frequency Ordered Stop   08/16/24 1100  cefTRIAXone  (ROCEPHIN ) 2 g in sodium chloride  0.9 % 100 mL IVPB        2 g 200 mL/hr over 30 Minutes Intravenous Every 24 hours 08/16/24 1001 08/20/24 1214   07/23/24 0600  vancomycin  (VANCOCIN ) IVPB 1000 mg/200 mL premix  Status:  Discontinued        1,000 mg 200 mL/hr over 60 Minutes Intravenous Every 12 hours 07/22/24 1834 07/24/24 1808   07/22/24 2200  meropenem  (MERREM ) 2  g in sodium chloride  0.9 % 100 mL IVPB  Status:  Discontinued        2 g 280 mL/hr over 30 Minutes Intravenous Every 8 hours 07/22/24 1601 07/24/24 1808   07/22/24 1700  vancomycin  (VANCOREADY) IVPB 1500 mg/300 mL        1,500 mg 150 mL/hr over 120 Minutes Intravenous  Once 07/22/24 1601 07/22/24 2031   07/22/24 1645  ampicillin  (OMNIPEN) 2 g in sodium chloride  0.9 % 100 mL IVPB  Status:  Discontinued        2 g 300 mL/hr over 20 Minutes Intravenous Every 6 hours 07/22/24 1557 07/24/24 1800   07/19/24 0530  meropenem  (MERREM ) 1 g in sodium chloride  0.9 % 100 mL IVPB  Status:  Discontinued        1 g 200 mL/hr over 30 Minutes Intravenous Every 8 hours 07/19/24 0438 07/22/24 1601   07/16/24 0900  ceFEPIme  (MAXIPIME ) 2 g in sodium chloride  0.9 % 100 mL IVPB  Status:  Discontinued        2 g 200 mL/hr over 30 Minutes Intravenous Every 8 hours 07/16/24 0811 07/19/24 0409   07/16/24 0815  ceFEPIme  (MAXIPIME ) 2 g in sodium chloride  0.9 % 100 mL IVPB  Status:  Discontinued        2 g 200 mL/hr over 30 Minutes Intravenous  Once 07/16/24 0803 07/16/24 0811   07/16/24 0330  cefTRIAXone  (ROCEPHIN ) 2 g in sodium chloride  0.9 % 100 mL IVPB        2 g 200 mL/hr over 30 Minutes Intravenous  Once 07/16/24 0315 07/16/24 0412         Subjective: Patient seen and examined.  No family member was present at the bedside.  Patient too lethargic to have any conversation today, she was able to have a conversation with me yesterday.  Continues to require pain medications.  Objective: Vitals:   08/24/24 1553 08/24/24 2030 08/25/24 0000 08/25/24 0447  BP: 128/61 (!) 109/53 (!) 110/45 (!) 121/49  Pulse: 69 61 (!) 53 65  Resp: 16 16 16 19   Temp: 98.9 F (37.2 C) 98.6 F (37 C) 98.4 F (36.9 C) 98.2 F (36.8 C)  TempSrc: Oral Axillary Axillary Axillary  SpO2: 100% 100% 100% 100%  Weight:      Height:        Intake/Output Summary (Last 24 hours) at 08/25/2024 0807 Last data filed at 08/25/2024  0450 Gross per 24 hour  Intake 994.5 ml  Output 1500 ml  Net -505.5 ml   Filed Weights   08/23/24 0351 08/23/24 1550 08/24/24 0454  Weight: 88.5 kg 88.5 kg 89.4 kg    Examination:  General exam: Appears very lethargic. Respiratory system: Clear to auscultation somewhat diminished breath sounds.  Poor inspiratory effort. Cardiovascular system: S1 & S2 heard, RRR. No JVD, murmurs, rubs, gallops or clicks. No pedal edema. Gastrointestinal system: Abdomen is nondistended, soft and nontender. No organomegaly  or masses felt. Normal bowel sounds heard. Central nervous system: Lethargic.  Data Reviewed: I have personally reviewed following labs and imaging studies  CBC: Recent Labs  Lab 08/19/24 0705 08/20/24 0500 08/21/24 0634 08/21/24 1130 08/22/24 0500 08/22/24 1618 08/23/24 0350 08/25/24 0744  WBC 6.2 6.2 4.5  --  5.3  --  5.2 5.5  NEUTROABS 4.1  --  3.0  --  3.9  --  3.3 3.8  HGB 9.4* 9.2* 8.0* 8.6* 7.6* 8.0* 8.0* 9.1*  HCT 29.5* 28.8* 25.3* 27.2* 24.0* 25.3* 25.3* 28.3*  MCV 93.7 93.5 95.8  --  95.6  --  94.8 96.9  PLT 224 244 182  --  170  --  191 198   Basic Metabolic Panel: Recent Labs  Lab 08/19/24 0705 08/20/24 0500 08/21/24 0634 08/22/24 0500 08/23/24 0350  NA 140 137 138 136 136  K 4.2 4.4 4.4 4.6 4.4  CL 106 103 104 106 104  CO2 28 27 29 27 28   GLUCOSE 94 258* 199* 358* 229*  BUN 22 25* 22 21 20   CREATININE 0.35* 0.42* 0.39* 0.39* 0.35*  CALCIUM  8.6* 8.5* 8.1* 8.0* 8.1*  MG 2.1 2.1 2.1 1.9 1.9  PHOS 4.2 3.0 2.7 1.9* 2.1*   GFR: Estimated Creatinine Clearance: 76.7 mL/min (A) (by C-G formula based on SCr of 0.35 mg/dL (L)). Liver Function Tests: Recent Labs  Lab 08/19/24 0705 08/21/24 0634 08/22/24 0500 08/23/24 0350  AST 40 55* 33 41  ALT 104* 167* 64* 72*  ALKPHOS 146* 178* 69 83  BILITOT 0.4 0.2 0.3 0.3  PROT 5.4* 4.9* 4.2* 4.5*  ALBUMIN  2.8* 2.8* 3.4* 3.2*   No results for input(s): LIPASE, AMYLASE in the last 168 hours. No  results for input(s): AMMONIA in the last 168 hours. Coagulation Profile: Recent Labs  Lab 08/18/24 1223  INR 1.0   Cardiac Enzymes: No results for input(s): CKTOTAL, CKMB, CKMBINDEX, TROPONINI in the last 168 hours. BNP (last 3 results) Recent Labs    05/22/24 0630  PROBNP 3,514.0*   HbA1C: No results for input(s): HGBA1C in the last 72 hours. CBG: Recent Labs  Lab 08/24/24 2035 08/24/24 2355 08/25/24 0424 08/25/24 0449 08/25/24 0739  GLUCAP 190* 176* 91 168* 169*   Lipid Profile: No results for input(s): CHOL, HDL, LDLCALC, TRIG, CHOLHDL, LDLDIRECT in the last 72 hours. Thyroid  Function Tests: No results for input(s): TSH, T4TOTAL, FREET4, T3FREE, THYROIDAB in the last 72 hours. Anemia Panel: No results for input(s): VITAMINB12, FOLATE, FERRITIN, TIBC, IRON , RETICCTPCT in the last 72 hours. Sepsis Labs: No results for input(s): PROCALCITON, LATICACIDVEN in the last 168 hours.  Recent Results (from the past 240 hours)  Culture, Urine (Do not remove urinary catheter, catheter placed by urology or difficult to place)     Status: Abnormal   Collection Time: 08/15/24 10:34 AM   Specimen: Urine, Catheterized  Result Value Ref Range Status   Specimen Description URINE, CATHETERIZED  Final   Special Requests   Final    NONE Performed at [facility], [address] [place], [place] [address]    Culture >=100,000 COLONIES/mL KLEBSIELLA PNEUMONIAE (A)  Final   Report Status 08/17/2024 FINAL  Final   Organism ID, Bacteria KLEBSIELLA PNEUMONIAE (A)  Final      Susceptibility   Klebsiella pneumoniae - MIC*    AMPICILLIN  >=32 RESISTANT Resistant     CEFAZOLIN  (URINE) Value in next row Resistant      RESISTANTThis is a modified FDA-approved test that  has been validated and its performance characteristics determined by the reporting laboratory.  This laboratory is certified under the Clinical Laboratory Improvement  Amendments CLIA as qualified to perform high complexity clinical laboratory testing.    CEFEPIME  Value in next row Sensitive      RESISTANTThis is a modified FDA-approved test that has been validated and its performance characteristics determined by the reporting laboratory.  This laboratory is certified under the Clinical Laboratory Improvement Amendments CLIA as qualified to perform high complexity clinical laboratory testing.    ERTAPENEM Value in next row Sensitive      RESISTANTThis is a modified FDA-approved test that has been validated and its performance characteristics determined by the reporting laboratory.  This laboratory is certified under the Clinical Laboratory Improvement Amendments CLIA as qualified to perform high complexity clinical laboratory testing.    CEFTRIAXONE  Value in next row Sensitive      RESISTANTThis is a modified FDA-approved test that has been validated and its performance characteristics determined by the reporting laboratory.  This laboratory is certified under the Clinical Laboratory Improvement Amendments CLIA as qualified to perform high complexity clinical laboratory testing.    CIPROFLOXACIN  Value in next row Sensitive      RESISTANTThis is a modified FDA-approved test that has been validated and its performance characteristics determined by the reporting laboratory.  This laboratory is certified under the Clinical Laboratory Improvement Amendments CLIA as qualified to perform high complexity clinical laboratory testing.    GENTAMICIN Value in next row Sensitive      RESISTANTThis is a modified FDA-approved test that has been validated and its performance characteristics determined by the reporting laboratory.  This laboratory is certified under the Clinical Laboratory Improvement Amendments CLIA as qualified to perform high complexity clinical laboratory testing.    NITROFURANTOIN Value in next row Intermediate      RESISTANTThis is a modified FDA-approved test  that has been validated and its performance characteristics determined by the reporting laboratory.  This laboratory is certified under the Clinical Laboratory Improvement Amendments CLIA as qualified to perform high complexity clinical laboratory testing.    TRIMETH /SULFA  Value in next row Sensitive      RESISTANTThis is a modified FDA-approved test that has been validated and its performance characteristics determined by the reporting laboratory.  This laboratory is certified under the Clinical Laboratory Improvement Amendments CLIA as qualified to perform high complexity clinical laboratory testing.    AMPICILLIN /SULBACTAM Value in next row Resistant      RESISTANTThis is a modified FDA-approved test that has been validated and its performance characteristics determined by the reporting laboratory.  This laboratory is certified under the Clinical Laboratory Improvement Amendments CLIA as qualified to perform high complexity clinical laboratory testing.    PIP/TAZO Value in next row Sensitive      16 SENSITIVEThis is a modified FDA-approved test that has been validated and its performance characteristics determined by the reporting laboratory.  This laboratory is certified under the Clinical Laboratory Improvement Amendments CLIA as qualified to perform high complexity clinical laboratory testing.    MEROPENEM  Value in next row Sensitive      16 SENSITIVEThis is a modified FDA-approved test that has been validated and its performance characteristics determined by the reporting laboratory.  This laboratory is certified under the Clinical Laboratory Improvement Amendments CLIA as qualified to perform high complexity clinical laboratory testing.    * >=100,000 COLONIES/mL KLEBSIELLA PNEUMONIAE  Culture, blood (Routine X 2) w Reflex to ID Panel  Status: None   Collection Time: 08/15/24 12:41 PM   Specimen: BLOOD RIGHT HAND  Result Value Ref Range Status   Specimen Description BLOOD RIGHT HAND  Final    Special Requests   Final    BOTTLES DRAWN AEROBIC AND ANAEROBIC Blood Culture adequate volume   Culture   Final    NO GROWTH 5 DAYS Performed at [facility], [address], [place], [place] [address]    Report Status 08/20/2024 FINAL  Final  Culture, blood (Routine X 2) w Reflex to ID Panel     Status: None   Collection Time: 08/15/24 12:41 PM   Specimen: BLOOD LEFT HAND  Result Value Ref Range Status   Specimen Description BLOOD LEFT HAND  Final   Special Requests   Final    BOTTLES DRAWN AEROBIC AND ANAEROBIC Blood Culture adequate volume   Culture   Final    NO GROWTH 5 DAYS Performed at [facility], [address] [place], [place] [address]    Report Status 08/20/2024 FINAL  Final     Radiology Studies: No results found.  Scheduled Meds:  amLODipine   10 mg Per Tube Daily   atorvastatin   80 mg Per Tube QHS   Chlorhexidine  Gluconate Cloth  6 each Topical Daily   free water   75 mL Per Tube Q4H   gabapentin   100 mg Per Tube Q8H   insulin  aspart  0-9 Units Subcutaneous Q4H   insulin  glargine  15 Units Subcutaneous BID   levETIRAcetam   500 mg Intravenous Q12H   lidocaine   1 patch Transdermal Daily   mouth rinse  15 mL Mouth Rinse 4 times per day   oxyCODONE   5 mg Per Tube Q6H   pantoprazole  (PROTONIX ) IV  40 mg Intravenous Q12H   propranolol   20 mg Per Tube TID   sodium chloride  flush  10-40 mL Intracatheter Q12H   Continuous Infusions:  citrate dextrose      feeding supplement (GLUCERNA 1.5 CAL) 1,000 mL (08/24/24 1955)     LOS: 39 days   Fredia Skeeter, MD [facility]  08/25/2024, 8:07 AM   *Please note that this is a verbal dictation therefore any spelling or grammatical errors are due to the Dragon Medical One system interpretation.  Please page via Amion and do not message via secure chat for urgent patient care matters. Secure chat can be used for non urgent patient care matters.  How to contact the [facility] Attending or Consulting  provider 7A - 7P or covering provider during after hours 7P -7A, for this patient?  Check the care team in [facility] and look for a) attending/consulting [facility] provider listed and b) the [facility] team listed. Page or secure chat 7A-7P. Log into [email or web address] and use 's universal password to access. If you do not have the password, please contact the hospital operator. Locate the [facility] provider you are looking for under [facility] and page to a number that you can be directly reached. If you still have difficulty reaching the provider, please page the [facility] (Director on Call) for the Hospitalists listed on amion for assistance.  "

## 2024-08-25 NOTE — TOC Progression Note (Signed)
 Transition of Care [facility]) - Progression Note    Patient Details  Name: Brenda [name] MRN: [id number] Date of Birth: August 30, 1961  Transition of Care [facility] CM/SW Contact  [name], [place] Phone Number: 08/25/2024, 1:28 PM  Clinical Narrative:   CSW following for disposition. Medical workup ongoing, palliative continuing to follow for goals of care. CSW spoke with patient's PCP at [facility] to provide brief update. CSW to follow.    Expected Discharge Plan:  (TBD) Barriers to Discharge: Continued Medical Work up               Expected Discharge Plan and Services                                               Social Drivers of Health (SDOH) Interventions SDOH Screenings   Food Insecurity: No Food Insecurity (07/16/2024)  Recent Concern: Food Insecurity - Food Insecurity Present (04/20/2024)  Housing: Low Risk (07/16/2024)  Transportation Needs: No Transportation Needs (07/16/2024)  Utilities: Not At Risk (07/16/2024)  Depression (PHQ2-9): Low Risk (06/30/2024)  Recent Concern: Depression (PHQ2-9) - Medium Risk (05/07/2024)  Social Connections: Moderately Integrated (07/13/2024)  Tobacco Use: Medium Risk (07/16/2024)    Readmission Risk Interventions    07/14/2024    2:34 PM 05/04/2024   10:00 AM  Readmission Risk Prevention Plan  Transportation Screening Complete Complete  PCP or Specialist Appt within 3-5 Days  Complete  HRI or Home Care Consult  Complete  Social Work Consult for Recovery Care Planning/Counseling  Complete  Palliative Care Screening  Not Applicable  Medication Review Oceanographer) Complete Complete  PCP or Specialist appointment within 3-5 days of discharge Complete   HRI or Home Care Consult Complete   SW Recovery Care/Counseling Consult Complete   Palliative Care Screening Not Applicable   Skilled Nursing Facility Not Applicable

## 2024-08-25 NOTE — Progress Notes (Signed)
 TPE # 3  08/25/24 1607  Vitals  Temp 97.8 F (36.6 C)  Temp Source Oral  Pulse Rate (!) 59  ECG Heart Rate 64  Resp 14  BP (!) 99/47  Oxygen  Therapy  SpO2 100 %  O2 Device Nasal Cannula  O2 Flow Rate (L/min) 2 L/min  Patient Activity (if Appropriate) In bed  Pulse Oximetry Type Continuous  Pain Assessment  Pain Scale 0-10  Pain Score Asleep  Apheresis   Procedure Comments Pt. sleeping with eyes closed. No distress noted.  Post-apheresis  Net Removed (mL) 3966 mL  Replacement (mL) 3233 mL  ACDA infused (mL) 579 mL  Total Normal Saline Administered 157 mL  Total Calcium  Administered 100 mL  Tolerated Procedure Yes  Post-Procedure Comments Tx. Completed and VSS. Report call to Bedside RN and patient next treatment #4 will be Christmas 08/27/24.  Hemodialysis Catheter Right Internal jugular Triple lumen Temporary (Non-Tunneled)  Placement Date/Time: 08/18/24 0919   Placed prior to admission: No  Serial / Lot #: [id number]  Expiration Date: 03/02/28  Time Out: Correct patient;Correct site;Correct procedure  Maximum sterile barrier precautions: Hand hygiene;Large sterile sheet;C...  Site Condition No complications  Blue Lumen Status Flushed;Antimicrobial dead end cap;Blood return noted  Red Lumen Status Flushed;Antimicrobial dead end cap;Blood return noted  Purple Lumen Status Saline locked  Catheter fill solution 4% Sodium Citrate   Catheter fill volume (Arterial) 1.4 cc  Catheter fill volume (Venous) 1.4  Dressing Type Transparent  Dressing Status Clean, Dry, Intact;Antimicrobial disc/dressing in place  Interventions  [facility])  Drainage Description None  Dressing Change Due 08/30/24  Post treatment catheter status Capped and Clamped

## 2024-08-25 NOTE — Progress Notes (Signed)
 " Daily Progress Note   Date: 08/25/2024   Patient Name: Brenda [name]  DOB: 07/26/1961  MRN: [id number]  Age / Sex: 63 y.o., female  Attending Physician: Vernon Ranks, MD Primary Care Physician: [facility] Admit Date: 07/15/2024 Length of Stay: 39 days  Reason for Follow-up: Establishing goals of care  Assessment & Plan:   HPI/Patient Profile:  63 y.o. 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   Palliative medicine consulted for goals of care conversation.   SUMMARY OF RECOMMENDATIONS Continue DNR limited Continue current care plan and allow more time for outcomes Continue oxycodone  5 mg every 6 hours per tube Continue Ativan  1 mg IV every 2 hours as needed for anxiety/agitation Continue Xanax  0.25 mg per tube 3 times daily as needed for anxiety/agitation Continue to follow for GOC and palliative support   Prognosis: Unable to determine  Discharge Planning: To Be Determined  Discussed with: patient's husband and daughter, RN, MD  Subjective:   Subjective: Chart reviewed including progress notes, vital signs, labs, and imaging, MAR.  Patient has required less as needed medications for pain and anxiety over the past 24 hours.  She has required 5 doses of IV Dilaudid  (compared to 7), 3 doses of IV Ativan  (compared to 6) and 1 dose of as needed oxycodone  (compared to 5).  Patient assessed at bedside.  She is resting comfortably, OT is present working with her.  Her husband is also present.  Created space and opportunity for patient's family to explore thoughts and feelings regarding patient's current medical situation.    Her husband continues to notice patient's rigidity is improving.  He heard from his daughter that patient had a rough night last night, though family were not  as familiar with the nurse and not sure if patient's baseline was clear to them.  He does not feel patient has had any adverse effects since adjusting her pain medicine yesterday.  He also notes that he is not opposed to her being a little sedated while undergoing plasmapheresis, with the hope that they can address this and focus on mental status after the treatments have completed.  No other needs at this time.  Questions and concerns addressed.  PMT will continue to follow and support.  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  Constitutional:      General: She is not in acute distress.    Appearance: She is obese. She is ill-appearing.  HENT:     Head: Normocephalic and atraumatic.     Nose: Nose normal.  Cardiovascular:     Rate and Rhythm: Normal rate.  Pulmonary:     Effort:  Pulmonary effort is normal.  Abdominal:     Palpations: Abdomen is soft.  Skin:    General: Skin is warm.  Neurological:     Comments: Did not attempt to arouse in order to preserve for comfort  Psychiatric:        Behavior: Behavior normal.    Palliative Assessment/Data: 30% (on tube feeds)    Billing based on time  Time Total: 35  Visit consisted of counseling and education dealing with the complex and emotionally intense issues of symptom management and palliative care  in the setting of serious and potentially life-threatening illness. Greater than 50% of this time was spent counseling and coordinating care related to the above assessment and plan.  Personally spent 35 minutes in patient care including extensive chart review (labs, imaging, progress/consult notes, vital signs), medically appropraite exam, discussed with treatment team, education to patient, family, and staff, documenting clinical information, medication review and management, coordination of care, and available advanced directive documents.    "

## 2024-08-25 NOTE — Plan of Care (Signed)

## 2024-08-25 NOTE — Progress Notes (Signed)
 NEUROLOGY CONSULT FOLLOW UP NOTE   Date of service: August 25, 2024 Patient Name: Brenda [name] MRN:  [id number] DOB:  1961-08-13  Interval Hx/subjective   Daughter at the bedside, she states her mom just received pain medication. States mom was more with it yesterday. More pain and agitation overnight, just received pain medicine. Grimaces to voice.   Vitals   Vitals:   08/24/24 2030 08/25/24 0000 08/25/24 0447 08/25/24 0817  BP: (!) 109/53 (!) 110/45 (!) 121/49 (!) 118/54  Pulse: 61 (!) 53 65 69  Resp: 16 16 19 16   Temp: 98.6 F (37 C) 98.4 F (36.9 C) 98.2 F (36.8 C) 98.5 F (36.9 C)  TempSrc: Axillary Axillary Axillary Axillary  SpO2: 100% 100% 100% 100%  Weight:      Height:         Physical Exam   Mental Status: Patient is sleeping, does not answer questions for me ranial Nerves: II: blinks to threat bilaterally. Pupils are unequal, left is larger and less reactive, documented repeatedly in previous notes as chronic.  III,IV, VI: eyes are midline, but her head is turned to the right.  Motor: Tone is increased throughout. Pain with passive ROM Sensory: She responds to nox stim x 4.     Medications  Current Facility-Administered Medications:    ALPRAZolam  (XANAX ) tablet 0.25 mg, 0.25 mg, Per Tube, TID PRN, Cooper, Josseline P, PA-C, 0.25 mg at 08/24/24 1702   amLODipine  (NORVASC ) tablet 10 mg, 10 mg, Per Tube, Daily, Drusilla, Sabas RAMAN, MD, 10 mg at 08/25/24 0827   artificial tears (LACRILUBE) ophthalmic ointment, , Both Eyes, Q4H PRN, Tariq, Hassan, MD, Given at 08/11/24 2102   atorvastatin  (LIPITOR ) tablet 80 mg, 80 mg, Per Tube, QHS, Sigdel, Santosh, MD, 80 mg at 08/24/24 2131   Chlorhexidine  Gluconate Cloth 2 % PADS 6 each, 6 each, Topical, Daily, Chavez, Abigail, NP, 6 each at 08/25/24 0830   citrate dextrose  (ACD-A  anticoagulant) solution 1,000 mL, 1,000 mL, Other, Continuous, Michaela Aisha SQUIBB, MD, 1,000 mL at 08/23/24 1539   diphenhydrAMINE   (BENADRYL ) 12.5 MG/5ML elixir 25 mg, 25 mg, Oral, Q6H PRN, Perri DELENA Meliton Mickey., MD, 25 mg at 08/24/24 2142   diphenhydrAMINE  (BENADRYL ) capsule 25 mg, 25 mg, Oral, Q6H PRN, Michaela Aisha SQUIBB, MD, 25 mg at 08/23/24 1452   feeding supplement (GLUCERNA 1.5 CAL) liquid 1,000 mL, 1,000 mL, Per Tube, Continuous, Perri DELENA Meliton Mickey., MD, Last Rate: 45 mL/hr at 08/24/24 1955, 1,000 mL at 08/24/24 1955   free water  75 mL, 75 mL, Per Tube, Q4H, Cosette Blackwater, MD, 75 mL at 08/25/24 0800   gabapentin  (NEURONTIN ) 250 MG/5ML solution 100 mg, 100 mg, Per Tube, Q8H, Perri DELENA Meliton Mickey., MD, 100 mg at 08/25/24 9340   guaiFENesin  (ROBITUSSIN) 100 MG/5ML liquid 15 mL, 15 mL, Per Tube, Q6H PRN, Drusilla Sabas RAMAN, MD, 15 mL at 08/25/24 9173   HYDROmorphone  (DILAUDID ) injection 0.5 mg, 0.5 mg, Intravenous, Q2H PRN, 0.5 mg at 08/25/24 0828 **OR** [DISCONTINUED] HYDROmorphone  (DILAUDID ) injection 1 mg, 1 mg, Intravenous, Q2H PRN, Perri DELENA Meliton Mickey., MD   insulin  aspart (novoLOG ) injection 0-9 Units, 0-9 Units, Subcutaneous, Q4H, Perri DELENA Meliton Mickey., MD, 2 Units at 08/25/24 9171   insulin  glargine (LANTUS ) injection 15 Units, 15 Units, Subcutaneous, BID, Pahwani, Ravi, MD, 15 Units at 08/25/24 0827   levETIRAcetam  (KEPPRA ) undiluted injection 500 mg, 500 mg, Intravenous, Q12H, Howerter, Justin B, DO, 500 mg at 08/25/24 0827   lidocaine  (LIDODERM ) 5 % 1  patch, 1 patch, Transdermal, Daily, Alekh, Kshitiz, MD, 1 patch at 08/25/24 9173   LORazepam  (ATIVAN ) injection 1 mg, 1 mg, Intravenous, Q2H PRN, Cooper, Josseline P, PA-C, 1 mg at 08/25/24 9171   naloxone  (NARCAN ) injection 0.4 mg, 0.4 mg, Intravenous, PRN, Alfornia Madison, MD   ondansetron  (ZOFRAN ) tablet 4 mg, 4 mg, Per Tube, Q6H PRN **OR** ondansetron  (ZOFRAN ) injection 4 mg, 4 mg, Intravenous, Q6H PRN, Cosette Blackwater, MD   Oral care mouth rinse, 15 mL, Mouth Rinse, 4 times per day, Perri DELENA Meliton Mickey., MD, 15 mL at 08/25/24 9170   Oral care mouth  rinse, 15 mL, Mouth Rinse, PRN, Perri DELENA Meliton Mickey., MD, 15 mL at 08/21/24 1145   oxyCODONE  (Oxy IR/ROXICODONE ) immediate release tablet 2.5 mg, 2.5 mg, Oral, Q4H PRN **OR** oxyCODONE  (Oxy IR/ROXICODONE ) immediate release tablet 5 mg, 5 mg, Oral, Q4H PRN, Perri DELENA Meliton Mickey., MD, 5 mg at 08/24/24 1653   oxyCODONE  (Oxy IR/ROXICODONE ) immediate release tablet 5 mg, 5 mg, Per Tube, Q6H, 5 mg at 08/25/24 0827 **AND** [DISCONTINUED] oxyCODONE  (Oxy IR/ROXICODONE ) immediate release tablet 2.5 mg, 2.5 mg, Oral, Q4H PRN, Perri DELENA Meliton Mickey., MD, 2.5 mg at 08/20/24 1122   pantoprazole  (PROTONIX ) injection 40 mg, 40 mg, Intravenous, Q12H, Perri DELENA Meliton Mickey., MD, 40 mg at 08/25/24 9171   prochlorperazine  (COMPAZINE ) injection 10 mg, 10 mg, Intravenous, Q6H PRN, Howerter, Justin B, DO   propranolol  (INDERAL ) tablet 20 mg, 20 mg, Per Tube, TID, Sigdel, Santosh, MD, 20 mg at 08/25/24 0828   senna-docusate (Senokot-S) tablet 1 tablet, 1 tablet, Oral, QHS PRN, Foust, Katy L, NP, 1 tablet at 08/23/24 1744   sodium chloride  flush (NS) 0.9 % injection 10-40 mL, 10-40 mL, Intracatheter, Q12H, Sigdel, Santosh, MD, 40 mL at 08/25/24 0800   sodium chloride  flush (NS) 0.9 % injection 10-40 mL, 10-40 mL, Intracatheter, PRN, Mcarthur Pick, MD  Labs and Diagnostic Imaging   CBC:  Recent Labs  Lab 08/23/24 0350 08/25/24 0744  WBC 5.2 5.5  NEUTROABS 3.3 3.8  HGB 8.0* 9.1*  HCT 25.3* 28.3*  MCV 94.8 96.9  PLT 191 198    Basic Metabolic Panel:  Lab Results  Component Value Date   NA 137 08/25/2024   K 4.5 08/25/2024   CO2 27 08/25/2024   GLUCOSE 179 (H) 08/25/2024   BUN 13 08/25/2024   CREATININE 0.33 (L) 08/25/2024   CALCIUM  8.4 (L) 08/25/2024   GFRNONAA >60 08/25/2024   GFRAA 94 12/16/2019   Lipid Panel:  Lab Results  Component Value Date   LDLCALC 28 05/30/2024   HgbA1c:  Lab Results  Component Value Date   HGBA1C 7.2 (H) 06/01/2024   Urine Drug Screen:     Component Value  Date/Time   LABOPIA POSITIVE (A) 04/20/2024 1817   COCAINSCRNUR NONE DETECTED 04/20/2024 1817   LABBENZ NONE DETECTED 04/20/2024 1817   AMPHETMU NONE DETECTED 04/20/2024 1817   THCU NONE DETECTED 04/20/2024 1817   LABBARB NONE DETECTED 04/20/2024 1817    Alcohol Level     Component Value Date/Time   ETH <15 04/20/2024 1344   INR  Lab Results  Component Value Date   INR 1.0 08/18/2024   APTT  Lab Results  Component Value Date   APTT 67 (H) 06/21/2024   MRI Brain, 08/09/24 (Personally reviewed): 1. Evolving infarcts without new/interval acute abnormality. 2. No evidence of metastatic disease.  Assessment  Brenda [name] is a 63 y.o. female past history of CAD, DM, obesity, OSA,  metastatic endometrial cancer, prior stroke, DVT, PE on anticoagulation, recurrent UTIs with concern for cefepime  toxicity causing seizures for which she was started on Keppra ; cefepime  was discontinued. Continued to have neurological worsening despite cessation of seizures and no additional new strokes on repeat imaging.  Cervical spine imaging also unremarkable for acute process. - At this time, stiffness on her exam coupled with altered mental status is of unclear etiology. 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 Her exam with waxy flexibility and decreased but intermittent verbalization could have been consistent with catatonia, but the time course would be unsual and lack of response to Ativan  makes this much less likely. - Steroids have been tried with no improvement and PLEX has been discussed in case this is an irAE (aka ICI-iE) associated with Keytruda , or some other antibody-driven autoimmune process.  - Her CSF results do not favor acute inflammatory demyelinating  polyneuropathy/Guillain-Barr type of pathology, nor does her  clinical presentation/exam.   Recommendations  3rd Plasmapheresis today, then every other day x 5 days total F/u paraneoplastic panel Neurology will continue to follow.  ______________________________________________________________________  Patient seen and examined by NP/APP.   Jorene Last, DNP, FNP-BC [facility] Pager: [phone]  Electronically signed: Dr. Pearlie Lafosse

## 2024-08-26 DIAGNOSIS — G9349 Other encephalopathy: Secondary | ICD-10-CM

## 2024-08-26 DIAGNOSIS — T45AX4A Poisoning by immune checkpoint inhibitors and immunostimulant drugs, undetermined, initial encounter: Secondary | ICD-10-CM | POA: Diagnosis not present

## 2024-08-26 DIAGNOSIS — T45AX5A Adverse effect of immune checkpoint inhibitors and immunostimulant drugs, initial encounter: Secondary | ICD-10-CM | POA: Diagnosis not present

## 2024-08-26 DIAGNOSIS — G9341 Metabolic encephalopathy: Secondary | ICD-10-CM | POA: Diagnosis not present

## 2024-08-26 DIAGNOSIS — C7982 Secondary malignant neoplasm of genital organs: Secondary | ICD-10-CM | POA: Diagnosis not present

## 2024-08-26 DIAGNOSIS — G2582 Stiff-man syndrome: Secondary | ICD-10-CM

## 2024-08-26 LAB — CBC
HCT: 26.1 % — ABNORMAL LOW (ref 36.0–46.0)
Hemoglobin: 8.1 g/dL — ABNORMAL LOW (ref 12.0–15.0)
MCH: 30.6 pg (ref 26.0–34.0)
MCHC: 31 g/dL (ref 30.0–36.0)
MCV: 98.5 fL (ref 80.0–100.0)
Platelets: 163 K/uL (ref 150–400)
RBC: 2.65 MIL/uL — ABNORMAL LOW (ref 3.87–5.11)
RDW: 20.5 % — ABNORMAL HIGH (ref 11.5–15.5)
WBC: 5.1 K/uL (ref 4.0–10.5)
nRBC: 0 % (ref 0.0–0.2)

## 2024-08-26 LAB — GLUCOSE, CAPILLARY
Glucose-Capillary: 132 mg/dL — ABNORMAL HIGH (ref 70–99)
Glucose-Capillary: 138 mg/dL — ABNORMAL HIGH (ref 70–99)
Glucose-Capillary: 139 mg/dL — ABNORMAL HIGH (ref 70–99)
Glucose-Capillary: 140 mg/dL — ABNORMAL HIGH (ref 70–99)
Glucose-Capillary: 159 mg/dL — ABNORMAL HIGH (ref 70–99)
Glucose-Capillary: 159 mg/dL — ABNORMAL HIGH (ref 70–99)

## 2024-08-26 NOTE — Progress Notes (Signed)
 NEUROLOGY CONSULT FOLLOW UP NOTE   Date of service: August 26, 2024 Patient Name: Brenda [name] MRN:  [id number] DOB:  1960-09-06  Interval Hx/subjective   Patient is seen in her room with her daughter at the bedside.  She remains hemodynamically stable and afebrile overnight.  Her daughter states that she is more interactive today and that she is seeing slow improvements with PLEX therapy.  Vitals   Vitals:   08/26/24 0016 08/26/24 0350 08/26/24 0500 08/26/24 0736  BP: (!) 101/48 (!) 116/50  (!) 135/44  Pulse: (!) 56 60  67  Resp: 17 16  20   Temp: 98.8 F (37.1 C) 98.8 F (37.1 C)  99.2 F (37.3 C)  TempSrc:    Axillary  SpO2: 100% 100%  100%  Weight:   88.1 kg   Height:         Physical Exam    NEURO:  Mental Status: Patient rests with eyes closed and responds to voice and touch.  She will respond to her name but is unable to state where she is.  She states she is not good when asked how she is doing but is unable to elaborate.  Cooperation with exam is limited as patient often says no when asked to participate in exam Speech/Language: speech is without dysarthria or aphasia but with perseveration.   Cranial Nerves:  III, IV, VI: Does not focus or track examiner VII: Face is symmetrical resting and speaking VIII: hearing intact to voice. IX, X: Phonation is normal.  Motor: Able to move fingers and toes bilaterally Tone is increased in bilateral upper and lower extremities Sensation- Intact to light touch bilaterally.   Coordination: Unable to perform Gait- deferred  Medications  Current Facility-Administered Medications:    acetaminophen  (TYLENOL ) tablet 650 mg, 650 mg, Oral, Q4H PRN, Michaela Aisha SQUIBB, MD, 650 mg at 08/25/24 1406   ALPRAZolam  (XANAX ) tablet 0.25 mg, 0.25 mg, Per Tube, TID PRN, Wonda, Josseline P, PA-C, 0.25 mg at 08/25/24 1406   amLODipine  (NORVASC ) tablet 10 mg, 10 mg, Per Tube, Daily, Drusilla, Sabas RAMAN, MD, 10 mg at 08/25/24 0827    artificial tears (LACRILUBE) ophthalmic ointment, , Both Eyes, Q4H PRN, Tariq, Hassan, MD, Given at 08/11/24 2102   atorvastatin  (LIPITOR ) tablet 80 mg, 80 mg, Per Tube, QHS, Sigdel, Santosh, MD, 80 mg at 08/25/24 2146   Chlorhexidine  Gluconate Cloth 2 % PADS 6 each, 6 each, Topical, Daily, Chavez, Abigail, NP, 6 each at 08/25/24 0830   citrate dextrose  (ACD-A  anticoagulant) solution 1,000 mL, 1,000 mL, Other, Continuous, Michaela Aisha SQUIBB, MD, 1,000 mL at 08/23/24 1539   citrate dextrose  (ACD-A  anticoagulant) solution 1,000 mL, 1,000 mL, Other, Continuous, Michaela Aisha SQUIBB, MD, 1,000 mL at 08/25/24 1508   diphenhydrAMINE  (BENADRYL ) 12.5 MG/5ML elixir 25 mg, 25 mg, Oral, Q6H PRN, Perri DELENA Meliton Mickey., MD, 25 mg at 08/24/24 2142   diphenhydrAMINE  (BENADRYL ) capsule 25 mg, 25 mg, Oral, Q6H PRN, Michaela Aisha SQUIBB, MD   diphenhydrAMINE  (BENADRYL ) capsule 25 mg, 25 mg, Oral, Q6H PRN, Michaela Aisha SQUIBB, MD, 25 mg at 08/25/24 1407   feeding supplement (GLUCERNA 1.5 CAL) liquid 1,000 mL, 1,000 mL, Per Tube, Continuous, Perri DELENA Meliton Mickey., MD, Last Rate: 45 mL/hr at 08/24/24 1955, 1,000 mL at 08/24/24 1955   free water  75 mL, 75 mL, Per Tube, Q4H, Cosette Blackwater, MD, 75 mL at 08/26/24 0804   gabapentin  (NEURONTIN ) 250 MG/5ML solution 100 mg, 100 mg, Per Tube, Q8H, Perri DELENA Meliton Mickey.,  MD, 100 mg at 08/26/24 0600   guaiFENesin  (ROBITUSSIN) 100 MG/5ML liquid 15 mL, 15 mL, Per Tube, Q6H PRN, Drusilla, Sabas RAMAN, MD, 15 mL at 08/25/24 9173   HYDROmorphone  (DILAUDID ) injection 0.5 mg, 0.5 mg, Intravenous, Q2H PRN, 0.5 mg at 08/26/24 0422 **OR** [DISCONTINUED] HYDROmorphone  (DILAUDID ) injection 1 mg, 1 mg, Intravenous, Q2H PRN, Perri DELENA Meliton Mickey., MD   insulin  aspart (novoLOG ) injection 0-9 Units, 0-9 Units, Subcutaneous, Q4H, Perri DELENA Meliton Mickey., MD, 2 Units at 08/26/24 9557   insulin  glargine (LANTUS ) injection 15 Units, 15 Units, Subcutaneous, BID, Vernon Ranks, MD, 15 Units at  08/25/24 2150   levETIRAcetam  (KEPPRA ) undiluted injection 500 mg, 500 mg, Intravenous, Q12H, Howerter, Justin B, DO, 500 mg at 08/25/24 2201   lidocaine  (LIDODERM ) 5 % 1 patch, 1 patch, Transdermal, Daily, Cheryle, Kshitiz, MD, 1 patch at 08/25/24 9173   LORazepam  (ATIVAN ) injection 1 mg, 1 mg, Intravenous, Q2H PRN, Cooper, Josseline P, PA-C, 1 mg at 08/26/24 0424   naloxone  (NARCAN ) injection 0.4 mg, 0.4 mg, Intravenous, PRN, Alfornia Madison, MD   ondansetron  (ZOFRAN ) tablet 4 mg, 4 mg, Per Tube, Q6H PRN **OR** ondansetron  (ZOFRAN ) injection 4 mg, 4 mg, Intravenous, Q6H PRN, Cosette Blackwater, MD   Oral care mouth rinse, 15 mL, Mouth Rinse, 4 times per day, Perri DELENA Meliton Mickey., MD, 15 mL at 08/26/24 0400   Oral care mouth rinse, 15 mL, Mouth Rinse, PRN, Perri DELENA Meliton Mickey., MD, 15 mL at 08/21/24 1145   oxyCODONE  (Oxy IR/ROXICODONE ) immediate release tablet 2.5 mg, 2.5 mg, Oral, Q4H PRN **OR** oxyCODONE  (Oxy IR/ROXICODONE ) immediate release tablet 5 mg, 5 mg, Oral, Q4H PRN, Perri DELENA Meliton Mickey., MD, 5 mg at 08/24/24 1653   oxyCODONE  (Oxy IR/ROXICODONE ) immediate release tablet 5 mg, 5 mg, Per Tube, Q6H, 5 mg at 08/26/24 0758 **AND** [DISCONTINUED] oxyCODONE  (Oxy IR/ROXICODONE ) immediate release tablet 2.5 mg, 2.5 mg, Oral, Q4H PRN, Perri DELENA Meliton Mickey., MD, 2.5 mg at 08/20/24 1122   pantoprazole  (PROTONIX ) injection 40 mg, 40 mg, Intravenous, Q12H, Perri DELENA Meliton Mickey., MD, 40 mg at 08/25/24 2151   prochlorperazine  (COMPAZINE ) injection 10 mg, 10 mg, Intravenous, Q6H PRN, Howerter, Justin B, DO   propranolol  (INDERAL ) tablet 20 mg, 20 mg, Per Tube, TID, Sigdel, Santosh, MD, 20 mg at 08/25/24 2146   senna-docusate (Senokot-S) tablet 1 tablet, 1 tablet, Oral, QHS PRN, Foust, Katy L, NP, 1 tablet at 08/23/24 1744   sodium chloride  flush (NS) 0.9 % injection 10-40 mL, 10-40 mL, Intracatheter, Q12H, Sigdel, Santosh, MD, 10 mL at 08/25/24 2200   sodium chloride  flush (NS) 0.9 % injection 10-40 mL,  10-40 mL, Intracatheter, PRN, Mcarthur Pick, MD  Labs and Diagnostic Imaging   CBC:  Recent Labs  Lab 08/23/24 0350 08/25/24 0744 08/26/24 0540  WBC 5.2 5.5 5.1  NEUTROABS 3.3 3.8  --   HGB 8.0* 9.1* 8.1*  HCT 25.3* 28.3* 26.1*  MCV 94.8 96.9 98.5  PLT 191 198 163    Basic Metabolic Panel:  Lab Results  Component Value Date   NA 137 08/25/2024   K 4.5 08/25/2024   CO2 27 08/25/2024   GLUCOSE 179 (H) 08/25/2024   BUN 13 08/25/2024   CREATININE 0.33 (L) 08/25/2024   CALCIUM  8.4 (L) 08/25/2024   GFRNONAA >60 08/25/2024   GFRAA 94 12/16/2019   Lipid Panel:  Lab Results  Component Value Date   LDLCALC 28 05/30/2024   HgbA1c:  Lab Results  Component Value Date   HGBA1C  7.2 (H) 06/01/2024   Urine Drug Screen:     Component Value Date/Time   LABOPIA POSITIVE (A) 04/20/2024 1817   COCAINSCRNUR NONE DETECTED 04/20/2024 1817   LABBENZ NONE DETECTED 04/20/2024 1817   AMPHETMU NONE DETECTED 04/20/2024 1817   THCU NONE DETECTED 04/20/2024 1817   LABBARB NONE DETECTED 04/20/2024 1817    Alcohol Level     Component Value Date/Time   ETH <15 04/20/2024 1344   INR  Lab Results  Component Value Date   INR 1.0 08/18/2024   APTT  Lab Results  Component Value Date   APTT 67 (H) 06/21/2024   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 Evolving infarcts without new/interval acute abnormality. 2. No evidence of metastatic disease.  Assessment  Brenda [name] is a 63 y.o. female past history of CAD, DM, obesity, OSA, metastatic endometrial cancer, prior embolic strokes in September of this year, DVT, PE on anticoagulation, recurrent UTIs who was admitted to [facility] on 11/13 with chills and weakness after discharge on 11/11 on cefadroxil  for management of UTI. Her weakness was preventing her from ambulating. She was started on cefepime .  Neurology was initially consulted on 11/16 for worsening encephalopathy that started 3 days after admission, along with tremors in BL uppers. Initially the tremors were intermittent and would go away when she would sleep but then became present continuously while awake.  There was concern for cefepime  toxicity causing seizures, for which she was started on Keppra ; cefepime  was discontinued. She continued to have neurological worsening despite cessation of seizures and no additional new strokes on repeat imaging.  Cervical spine imaging was also unremarkable for acute process. Neurology was re-consulted for worsening limb stiffness. 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  - Serial examinations this week are with slight improvement since PLEX was started.  - At this time, stiffness on her exam coupled with altered mental status is of unclear etiology. 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 Her exam with waxy flexibility and decreased but intermittent verbalization could have been consistent with catatonia, but the time course would be unsual and lack of response to Ativan  makes this much less likely.  GAD 65 antibody was negative, but it is only positive in about two thirds of patients with stiff person syndrome and is generally negative in patients with paraneoplastic stiff person syndrome. Anti-amphiphysin antibodies,can also play a role in stiff person syndrome, but have come back negative. - Steroids have been tried with no improvement and PLEX has been started empirically in case this is an irAE (aka ICI-iE) associated with Keytruda , or some other antibody-driven autoimmune process.  - Her CSF  results do not favor acute inflammatory demyelinating  polyneuropathy/Guillain-Barr type of pathology, nor does her clinical presentation/exam.   Recommendations  Fourth plasmapheresis tomorrow, then every other day x 5 days total Could consider trial of diazepam  for stiff person syndrome Follow-up for pending paraneoplastic panels on serum and CSF Autoimmune encephalopathy panel has come back pan-negative Neurology will continue to follow.  ______________________________________________________________________  Patient seen and examined by NP/APP.   Cortney E Everitt Clint Kill , MSN, AGACNP-BC [facility] See Amion for schedule and pager information 08/26/2024 8:16 AM  Electronically signed: Dr. Masiyah Jorstad

## 2024-08-26 NOTE — Progress Notes (Signed)
 " PROGRESS NOTE    Brenda [name]  FMW:[id number] DOB: 23-Sep-1960 DOA: 07/15/2024 PCP: [facility]   Brief Narrative:  63 year old female with history of metastatic endometrial cancer, chronic cancer-related pain, hypertension, hyperlipidemia, diabetes mellitus, CVA, history of DVT and PE anticoagulated on Lovenox , OSA, chronic hypoxic respiratory failure on 2L via nasal cannula and recent hospitalization and discharged on 07/14/2024 for UTI treated with IV antibiotics followed by oral antibiotics on discharge presented with chills and weakness.  She was found to have UTI and was started on IV cefepime .  Unfortunately mental status worsened and she was transferred to [facility] for continuous EEG monitoring with 4 seizures noted on video EEG. She was on Keppra  11/16. Placed on empiric broad-spectrum antibiotics for possible meningitis/encephalitis which was ruled out based on CSF results. .    Neurology has been reconsulted 12/7 with development of rigidity.  Oncology has been reconsulted.  Family has begun to discuss palliative care, but at this point managing her cancer related pain and working on rigidity with neurology.  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 4 seizures noted 11/16. S/p LP, meningitis ruled out Her  husband notes worsening stiffness/rigidity/inability to move extremities starting about 1 week ago. EEG without epileptiform abnormalities. head CT without acute intracranial abnormality. MRI brain with evolving infarcts - MRI C spine without evidence of metastatic disease.  Remains on Keppra .  Neurology managing. appreciate neurology recommendations - ddx includes PD-1 inhibitor induced side effect, paraneoplatic syndrome, stiff person syndrome.  Considering catatonia.  We've stopped prozac  (low dose, lower suspicion for serotonin syndrome?).  S/p high dose steroids 12/9-11.  S/p trial sinimet.  S/p baclofen .  Follow paraneoplastic panel, GAD65 ab.  S/p PLEX #1 on 12/19 and #2 on 08/23/2024 and #3 on 08/25/2024, per neurology, plan for total 5 days of treatment.   Acute Metabolic Encephalopathy Intermittently encephalopathic. Delirium precautions  issues with somnolence on fentanyl , will balance need for opiates with sedation    Anemia / Bright Red Blood Per Rectum BRBPR resolved at this time - anticoagulation on hold  CT GI bleed study without evidence GI bleed. IV PPI BID S/p 1 unit pRBC 12/1 - S/p IV iron . Normal folate, b12.  Normal iron , ferritin.  Hemoglobin fairly stable.   Klebsiella Pneumoniae UTI Treated with abx for UTI - s/p 5 day course Blood cultures NGx2   Metastatic Endometrial Cancer / Cancer Related Pain  Appreciate oncology consult  CT chest/abdomen/pelvis for persistent (worsening?) back pain (note centrally necrotic infiltrative mass in L psoas in 06/2024) CT 12/7 with R psoas muscle mass like enlargement measuring 5.0 x 4.6 cm indeterminate for tumor vs hematoma  - L psoas/paraspinal mass decreased in size.   CT 12/16 with R psoas muscle hematoma (discussed with IR 12/19, who noted nothing to drain), L2 sclerotic  changes with L paraspinal masslike tissue at L2 concerning for neoplastic process Appreciate oncology needs - last saw 08/24/2024. Appreciate palliative care  for adjusting the pain medications.   Elevated LFT's Monitor - relatively mild, will trend for now   Dysphagia Cortrak + tube feeds - eventually decision regarding PEG will need to be made, but will hold off for now with our trial of plex   Depression Anxiety Prozac  discontinued with rigidity   Pulmonary Embolism Splenic Infarct Lovenox  on hold with GI bleed and psoas hematoma above   CAD  Hx Stroke Lovenox  on hold   Hypernatremia Resolved   Abnormal Thyroid  Function Tests Repeat TSH and free T4 wnl Negative TSI On propanolol    T2DM Currently on Lantus  15 units twice daily and SSI, blood sugar fairly controlled.  Goals of Care Brenda [name] and her husband beginning to talk about palliative care on the day I met them (12/7).  I had a conversation with them about comfort focused care and hospice, but at this point, we're trying to work up and treat the acute issues below (with regards to her rigidity and encephalopathy).  At this point in time, husband and the family are hoping to trial PLEX.  Will do our best to keep her as comfortable as possible as we pursue this treatment option with neurology assistance.  Per palliative, husband asking about transfer if she's not improved with this but not sure if he says that when he is emotional or not.  Will address once 5 days of Plex is completed.  Appreciate palliative care following this patient.  DVT prophylaxis: Place and maintain sequential compression device Start: 08/02/24 0738   Code Status: Limited: Do not attempt resuscitation (DNR) -DNR-LIMITED -Do Not Intubate/DNI   Family Communication: Daughter present at bedside.   Status is: Inpatient Remains inpatient appropriate because: Encephalopathic   Estimated body mass index is 35.52 kg/m as calculated from the following:   Height as of this encounter: 5' 2 (1.575 m).   Weight as of this encounter: 88.1 kg.  Wound 07/25/24 1100 Pressure Injury Sacrum Medial Deep  Tissue Pressure Injury - Purple or maroon localized area of discolored intact skin or blood-filled blister due to damage of underlying soft tissue from pressure and/or shear. (Active)     Wound 08/11/24 0415 Pressure Injury Vertebral column Medial Stage 2 -  Partial thickness loss of dermis presenting as a shallow open injury with a red, pink wound bed without slough. (Active)   Nutritional Assessment: Body mass index is 35.52 kg/m.SABRA Seen by dietician.  I agree with the assessment and plan as outlined below: Nutrition Status: Nutrition Problem: Moderate Malnutrition Etiology: chronic illness (cancer) Signs/Symptoms: percent weight loss, mild muscle depletion, moderate muscle depletion (12% x 6 months) Percent weight loss: 12 % Interventions: Prostat, Tube feeding, MVI  . Skin Assessment: I have examined the patient's skin and I agree with the wound assessment as performed by the wound care RN as outlined below: Wound 07/25/24 1100 Pressure Injury Sacrum Medial Deep Tissue Pressure Injury - Purple or maroon localized area of discolored intact skin or blood-filled blister due to damage of underlying soft tissue from pressure and/or shear. (Active)     Wound 08/11/24 0415 Pressure Injury Vertebral column Medial Stage 2 -  Partial thickness loss of dermis presenting as a shallow open injury with a red, pink wound bed without slough. (Active)    Consultants:  Neurology, palliative care  Procedures:  As above  Antimicrobials:  Anti-infectives (  From admission, onward)    Start     Dose/Rate Route Frequency Ordered Stop   08/16/24 1100  cefTRIAXone  (ROCEPHIN ) 2 g in sodium chloride  0.9 % 100 mL IVPB        2 g 200 mL/hr over 30 Minutes Intravenous Every 24 hours 08/16/24 1001 08/20/24 1214   07/23/24 0600  vancomycin  (VANCOCIN ) IVPB 1000 mg/200 mL premix  Status:  Discontinued        1,000 mg 200 mL/hr over 60 Minutes Intravenous Every 12 hours 07/22/24 1834 07/24/24 1808   07/22/24  2200  meropenem  (MERREM ) 2 g in sodium chloride  0.9 % 100 mL IVPB  Status:  Discontinued        2 g 280 mL/hr over 30 Minutes Intravenous Every 8 hours 07/22/24 1601 07/24/24 1808   07/22/24 1700  vancomycin  (VANCOREADY) IVPB 1500 mg/300 mL        1,500 mg 150 mL/hr over 120 Minutes Intravenous  Once 07/22/24 1601 07/22/24 2031   07/22/24 1645  ampicillin  (OMNIPEN) 2 g in sodium chloride  0.9 % 100 mL IVPB  Status:  Discontinued        2 g 300 mL/hr over 20 Minutes Intravenous Every 6 hours 07/22/24 1557 07/24/24 1800   07/19/24 0530  meropenem  (MERREM ) 1 g in sodium chloride  0.9 % 100 mL IVPB  Status:  Discontinued        1 g 200 mL/hr over 30 Minutes Intravenous Every 8 hours 07/19/24 0438 07/22/24 1601   07/16/24 0900  ceFEPIme  (MAXIPIME ) 2 g in sodium chloride  0.9 % 100 mL IVPB  Status:  Discontinued        2 g 200 mL/hr over 30 Minutes Intravenous Every 8 hours 07/16/24 0811 07/19/24 0409   07/16/24 0815  ceFEPIme  (MAXIPIME ) 2 g in sodium chloride  0.9 % 100 mL IVPB  Status:  Discontinued        2 g 200 mL/hr over 30 Minutes Intravenous  Once 07/16/24 0803 07/16/24 0811   07/16/24 0330  cefTRIAXone  (ROCEPHIN ) 2 g in sodium chloride  0.9 % 100 mL IVPB        2 g 200 mL/hr over 30 Minutes Intravenous  Once 07/16/24 0315 07/16/24 0412         Subjective: Patient seen and examined, primary nurse at the bedside.  No family member present at bedside.  Patient yet again continues to keep her eyes closed but she is slightly more oriented, she was able to tell me her name but nothing else.  She is slightly more talkative today.  Objective: Vitals:   08/26/24 0500 08/26/24 0736 08/26/24 0947 08/26/24 0957  BP:  (!) 135/44 (!) 117/52 (!) 117/52  Pulse:  67 75 73  Resp:  20  20  Temp:  99.2 F (37.3 C)    TempSrc:  Axillary    SpO2:  100%  100%  Weight: 88.1 kg     Height:        Intake/Output Summary (Last 24 hours) at 08/26/2024 1121 Last data filed at 08/26/2024 1000 Gross per  24 hour  Intake 450 ml  Output 1100 ml  Net -650 ml   Filed Weights   08/25/24 1047 08/25/24 1458 08/26/24 0500  Weight: 89.8 kg 89.9 kg 88.1 kg    Examination:  General exam: Appears calm and comfortable but lethargic Respiratory system: Clear to auscultation.  Diminished breath sounds. Cardiovascular system: S1 & S2 heard, RRR. No JVD, murmurs, rubs, gallops or clicks. No pedal edema. Gastrointestinal system:  Abdomen is nondistended, soft and nontender. No organomegaly or masses felt. Normal bowel sounds heard. Central nervous system: Lethargic and oriented only to herself.  Not following commands.  Keeps her eyes closed.  Data Reviewed: I have personally reviewed following labs and imaging studies  CBC: Recent Labs  Lab 08/21/24 0634 08/21/24 1130 08/22/24 0500 08/22/24 1618 08/23/24 0350 08/25/24 0744 08/26/24 0540  WBC 4.5  --  5.3  --  5.2 5.5 5.1  NEUTROABS 3.0  --  3.9  --  3.3 3.8  --   HGB 8.0*   < > 7.6* 8.0* 8.0* 9.1* 8.1*  HCT 25.3*   < > 24.0* 25.3* 25.3* 28.3* 26.1*  MCV 95.8  --  95.6  --  94.8 96.9 98.5  PLT 182  --  170  --  191 198 163   < > = values in this interval not displayed.   Basic Metabolic Panel: Recent Labs  Lab 08/20/24 0500 08/21/24 0634 08/22/24 0500 08/23/24 0350 08/25/24 0744  NA 137 138 136 136 137  K 4.4 4.4 4.6 4.4 4.5  CL 103 104 106 104 103  CO2 27 29 27 28 27   GLUCOSE 258* 199* 358* 229* 179*  BUN 25* 22 21 20 13   CREATININE 0.42* 0.39* 0.39* 0.35* 0.33*  CALCIUM  8.5* 8.1* 8.0* 8.1* 8.4*  MG 2.1 2.1 1.9 1.9  --   PHOS 3.0 2.7 1.9* 2.1*  --    GFR: Estimated Creatinine Clearance: 76.1 mL/min (A) (by C-G formula based on SCr of 0.33 mg/dL (L)). Liver Function Tests: Recent Labs  Lab 08/21/24 0634 08/22/24 0500 08/23/24 0350  AST 55* 33 41  ALT 167* 64* 72*  ALKPHOS 178* 69 83  BILITOT 0.2 0.3 0.3  PROT 4.9* 4.2* 4.5*  ALBUMIN  2.8* 3.4* 3.2*   No results for input(s): LIPASE, AMYLASE in the last 168  hours. No results for input(s): AMMONIA in the last 168 hours. Coagulation Profile: No results for input(s): INR, PROTIME in the last 168 hours.  Cardiac Enzymes: No results for input(s): CKTOTAL, CKMB, CKMBINDEX, TROPONINI in the last 168 hours. BNP (last 3 results) Recent Labs    05/22/24 0630  PROBNP 3,514.0*   HbA1C: No results for input(s): HGBA1C in the last 72 hours. CBG: Recent Labs  Lab 08/25/24 1712 08/25/24 2017 08/26/24 0017 08/26/24 0418 08/26/24 0740  GLUCAP 158* 163* 159* 159* 139*   Lipid Profile: No results for input(s): CHOL, HDL, LDLCALC, TRIG, CHOLHDL, LDLDIRECT in the last 72 hours. Thyroid  Function Tests: No results for input(s): TSH, T4TOTAL, FREET4, T3FREE, THYROIDAB in the last 72 hours. Anemia Panel: No results for input(s): VITAMINB12, FOLATE, FERRITIN, TIBC, IRON , RETICCTPCT in the last 72 hours. Sepsis Labs: No results for input(s): PROCALCITON, LATICACIDVEN in the last 168 hours.  No results found for this or any previous visit (from the past 240 hours).    Radiology Studies: No results found.  Scheduled Meds:  amLODipine   10 mg Per Tube Daily   atorvastatin   80 mg Per Tube QHS   Chlorhexidine  Gluconate Cloth  6 each Topical Daily   free water   75 mL Per Tube Q4H   gabapentin   100 mg Per Tube Q8H   insulin  aspart  0-9 Units Subcutaneous Q4H   insulin  glargine  15 Units Subcutaneous BID   levETIRAcetam   500 mg Intravenous Q12H   lidocaine   1 patch Transdermal Daily   mouth rinse  15 mL Mouth Rinse 4 times per day   oxyCODONE   5 mg Per Tube Q6H   pantoprazole  (PROTONIX ) IV  40 mg Intravenous Q12H   propranolol   20 mg Per Tube TID   sodium chloride  flush  10-40 mL Intracatheter Q12H   Continuous Infusions:  citrate dextrose      citrate dextrose      feeding supplement (GLUCERNA 1.5 CAL) 1,000 mL (08/24/24 1955)     LOS: 40 days   Fredia Skeeter, MD [facility]  08/26/2024, 11:21 AM   *Please note that this is a verbal dictation therefore any spelling or grammatical errors are due to the Dragon [employer] One system interpretation.  Please page via Amion and do not message via secure chat for urgent patient care matters. Secure chat can be used for non urgent patient care matters.  How to contact the [facility] Attending or Consulting provider 7A - 7P or covering provider during after hours 7P -7A, for this patient?  Check the care team in [facility] and look for a) attending/consulting [facility] provider listed and b) the [facility] team listed. Page or secure chat 7A-7P. Log into [email or web address] and use [place]'s universal password to access. If you do not have the password, please contact the hospital operator. Locate the [facility] provider you are looking for under [facility] and page to a number that you can be directly reached. If you still have difficulty reaching the provider, please page the [facility] (Director on Call) for the Hospitalists listed on amion for assistance.  "

## 2024-08-26 NOTE — Plan of Care (Signed)
" °  Problem: Fluid Volume: Goal: Ability to maintain a balanced intake and output will improve Outcome: Progressing   Problem: Health Behavior/Discharge Planning: Goal: Ability to manage health-related needs will improve Outcome: Progressing   Problem: Nutritional: Goal: Maintenance of adequate nutrition will improve Outcome: Progressing   Problem: Skin Integrity: Goal: Risk for impaired skin integrity will decrease Outcome: Progressing   Problem: Clinical Measurements: Goal: Will remain free from infection Outcome: Progressing   Problem: Clinical Measurements: Goal: Respiratory complications will improve Outcome: Progressing   Problem: Activity: Goal: Risk for activity intolerance will decrease Outcome: Progressing   Problem: Nutrition: Goal: Adequate nutrition will be maintained Outcome: Progressing   Problem: Coping: Goal: Level of anxiety will decrease Outcome: Progressing   Problem: Pain Managment: Goal: General experience of comfort will improve and/or be controlled Outcome: Progressing   Problem: Safety: Goal: Ability to remain free from injury will improve Outcome: Progressing   "

## 2024-08-27 DIAGNOSIS — G9341 Metabolic encephalopathy: Secondary | ICD-10-CM | POA: Diagnosis not present

## 2024-08-27 LAB — BASIC METABOLIC PANEL WITH GFR
Anion gap: 7 (ref 5–15)
BUN: 13 mg/dL (ref 8–23)
CO2: 29 mmol/L (ref 22–32)
Calcium: 8.9 mg/dL (ref 8.9–10.3)
Chloride: 102 mmol/L (ref 98–111)
Creatinine, Ser: 0.31 mg/dL — ABNORMAL LOW (ref 0.44–1.00)
GFR, Estimated: >60 mL/min
Glucose, Bld: 167 mg/dL — ABNORMAL HIGH (ref 70–99)
Potassium: 4.3 mmol/L (ref 3.5–5.1)
Sodium: 138 mmol/L (ref 135–145)

## 2024-08-27 LAB — POCT I-STAT, CHEM 8
BUN: 12 mg/dL (ref 8–23)
Calcium, Ion: 1.22 mmol/L (ref 1.15–1.40)
Chloride: 98 mmol/L (ref 98–111)
Creatinine, Ser: 0.4 mg/dL — ABNORMAL LOW (ref 0.44–1.00)
Glucose, Bld: 167 mg/dL — ABNORMAL HIGH (ref 70–99)
HCT: 27 % — ABNORMAL LOW (ref 36.0–46.0)
Hemoglobin: 9.2 g/dL — ABNORMAL LOW (ref 12.0–15.0)
Potassium: 4.3 mmol/L (ref 3.5–5.1)
Sodium: 140 mmol/L (ref 135–145)
TCO2: 29 mmol/L (ref 22–32)

## 2024-08-27 LAB — GLUCOSE, CAPILLARY
Glucose-Capillary: 122 mg/dL — ABNORMAL HIGH (ref 70–99)
Glucose-Capillary: 131 mg/dL — ABNORMAL HIGH (ref 70–99)
Glucose-Capillary: 134 mg/dL — ABNORMAL HIGH (ref 70–99)
Glucose-Capillary: 137 mg/dL — ABNORMAL HIGH (ref 70–99)
Glucose-Capillary: 164 mg/dL — ABNORMAL HIGH (ref 70–99)
Glucose-Capillary: 172 mg/dL — ABNORMAL HIGH (ref 70–99)
Glucose-Capillary: 195 mg/dL — ABNORMAL HIGH (ref 70–99)

## 2024-08-27 LAB — CBC
HCT: 27.7 % — ABNORMAL LOW (ref 36.0–46.0)
Hemoglobin: 8.8 g/dL — ABNORMAL LOW (ref 12.0–15.0)
MCH: 30.8 pg (ref 26.0–34.0)
MCHC: 31.8 g/dL (ref 30.0–36.0)
MCV: 96.9 fL (ref 80.0–100.0)
Platelets: 185 K/uL (ref 150–400)
RBC: 2.86 MIL/uL — ABNORMAL LOW (ref 3.87–5.11)
RDW: 19.9 % — ABNORMAL HIGH (ref 11.5–15.5)
WBC: 5.4 K/uL (ref 4.0–10.5)
nRBC: 0 % (ref 0.0–0.2)

## 2024-08-27 MED ORDER — GLUCERNA 1.5 CAL PO LIQD
1000.0000 mL | ORAL | Status: AC
  Administered 2024-08-27 – 2024-09-10 (×11): 1000 mL
  Filled 2024-08-27 (×17): qty 1000

## 2024-08-27 MED ORDER — NYSTATIN 100000 UNIT/GM EX POWD
100000.0000 [IU] | Freq: Three times a day (TID) | CUTANEOUS | Status: AC
  Administered 2024-08-27 – 2024-10-09 (×128): 1 via TOPICAL
  Filled 2024-08-27 (×4): qty 15

## 2024-08-27 MED ORDER — CALCIUM CARBONATE ANTACID 500 MG PO CHEW
400.0000 mg | CHEWABLE_TABLET | Freq: Once | ORAL | Status: AC
  Administered 2024-08-27: 400 mg via ORAL

## 2024-08-27 MED ORDER — SODIUM CHLORIDE 0.9 % IV SOLN
INTRAVENOUS | Status: AC
  Filled 2024-08-27 (×4): qty 200

## 2024-08-27 MED ORDER — CALCIUM GLUCONATE-NACL 2-0.675 GM/100ML-% IV SOLN
INTRAVENOUS | Status: AC
  Filled 2024-08-27: qty 100

## 2024-08-27 MED ORDER — CALCIUM CARBONATE ANTACID 500 MG PO CHEW
CHEWABLE_TABLET | ORAL | Status: AC
  Filled 2024-08-27: qty 4

## 2024-08-27 MED ORDER — HEPARIN SODIUM (PORCINE) 1000 UNIT/ML IJ SOLN
INTRAMUSCULAR | Status: AC
  Filled 2024-08-27: qty 3

## 2024-08-27 MED ORDER — ACETAMINOPHEN 325 MG PO TABS
ORAL_TABLET | ORAL | Status: AC
  Filled 2024-08-27: qty 2

## 2024-08-27 MED ORDER — DIPHENHYDRAMINE HCL 25 MG PO CAPS
ORAL_CAPSULE | ORAL | Status: AC
  Filled 2024-08-27: qty 1

## 2024-08-27 NOTE — Progress Notes (Signed)
 " PROGRESS NOTE    Brenda [name]  FMW:[id number] DOB: 11/29/1960 DOA: 07/15/2024 PCP: [facility] [name]   Brief Narrative:  63 year old female with history of metastatic endometrial cancer, chronic cancer-related pain, hypertension, hyperlipidemia, diabetes mellitus, CVA, history of DVT and PE anticoagulated on Lovenox , OSA, chronic hypoxic respiratory failure on 2L via nasal cannula and recent hospitalization and discharged on 07/14/2024 for UTI treated with IV antibiotics followed by oral antibiotics on discharge presented with chills and weakness.  She was found to have UTI and was started on IV cefepime .  Unfortunately mental status worsened and she was transferred to [facility] for continuous EEG monitoring with 4 seizures noted on video EEG. She was on Keppra  11/16. Placed on empiric broad-spectrum antibiotics for possible meningitis/encephalitis which was ruled out based on CSF results. .    Neurology has been reconsulted 12/7 with development of rigidity.  Oncology has been reconsulted.  Family has begun to discuss palliative care, but at this point managing her cancer related pain and working on rigidity with neurology.  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 4 seizures noted 11/16. S/p LP, meningitis ruled out Her  husband notes worsening stiffness/rigidity/inability to move extremities starting about 1 week ago. EEG without epileptiform abnormalities. head CT without acute intracranial abnormality. MRI brain with evolving infarcts - MRI C spine without evidence of metastatic disease.  Remains on Keppra .  Neurology managing.  Per neurology GAD 65 antibody was negative, but it is only positive in about two thirds of patients with stiff person syndrome and is generally negative in patients with paraneoplastic stiff person syndrome. Anti-amphiphysin antibodies,can also play a role in stiff person syndrome, but have come back negative steroids have been tried with no improvement. appreciate neurology recommendations - ddx includes PD-1 inhibitor induced side effect, paraneoplatic syndrome, stiff person syndrome.  Considering catatonia.  We've stopped prozac  (low dose, lower suspicion for serotonin syndrome?).  S/p high dose steroids 12/9-11.  S/p trial sinimet.  S/p baclofen .  Follow paraneoplastic panel, GAD65 ab.  S/p PLEX #1 on 12/19 and #2 on 08/23/2024 and #3 on 08/25/2024 and 4th on 08/27/2024, per neurology, plan for total 5 days of treatment.   Acute Metabolic Encephalopathy Intermittently encephalopathic. Delirium precautions  issues with somnolence on fentanyl , will balance need for opiates with sedation    Anemia / Bright Red Blood Per Rectum BRBPR resolved at this time - anticoagulation on hold  CT GI bleed study without evidence GI bleed. IV PPI BID S/p 1 unit pRBC 12/1 - S/p IV iron . Normal folate, b12.  Normal iron , ferritin.  Hemoglobin fairly stable.   Klebsiella Pneumoniae UTI Treated with abx for UTI - s/p 5 day course Blood cultures NGx2   Metastatic Endometrial Cancer / Cancer Related Pain  Appreciate oncology consult  CT chest/abdomen/pelvis for persistent (  worsening?) back pain (note centrally necrotic infiltrative mass in L psoas in 06/2024) CT 12/7 with R psoas muscle mass like  enlargement measuring 5.0 x 4.6 cm indeterminate for tumor vs hematoma  - L psoas/paraspinal mass decreased in size.   CT 12/16 with R psoas muscle hematoma (discussed with IR 12/19, who noted nothing to drain), L2 sclerotic changes with L paraspinal masslike tissue at L2 concerning for neoplastic process Appreciate oncology needs - last saw 08/24/2024. Appreciate palliative care for adjusting the pain medications.   Elevated LFT's Monitor - relatively mild, will trend for now   Dysphagia Cortrak + tube feeds - eventually decision regarding PEG will need to be made, but will hold off for now with our trial of plex   Depression Anxiety Prozac  discontinued with rigidity   Pulmonary Embolism Splenic Infarct Lovenox  on hold with GI bleed and psoas hematoma above   CAD  Hx Stroke Lovenox  on hold   Hypernatremia Resolved   Abnormal Thyroid  Function Tests Repeat TSH and free T4 wnl Negative TSI On propanolol    T2DM Currently on Lantus  15 units twice daily and SSI, blood sugar fairly controlled.  Goals of Care Brenda [name] and her husband beginning to talk about palliative care on the day I met them (12/7).  I had a conversation with them about comfort focused care and hospice, but at this point, we're trying to work up and treat the acute issues below (with regards to her rigidity and encephalopathy).  At this point in time, husband and the family are hoping to trial PLEX.  Will do our best to keep her as comfortable as possible as we pursue this treatment option with neurology assistance.  Per palliative, husband asking about transfer if she's not improved with this but not sure if he says that when he is emotional or not.  Will address once 5 days of Plex is completed.  Appreciate palliative care following this patient.  DVT prophylaxis: Place and maintain sequential compression device Start: 08/02/24 0738   Code Status: Limited: Do not attempt resuscitation (DNR) -DNR-LIMITED -Do  Not Intubate/DNI   Family Communication: None at bedside.  Status is: Inpatient Remains inpatient appropriate because: Encephalopathic   Estimated body mass index is 35.16 kg/m as calculated from the following:   Height as of this encounter: 5' 2 (1.575 m).   Weight as of this encounter: 87.2 kg.  Wound 07/25/24 1100 Pressure Injury Sacrum Medial Deep Tissue Pressure Injury - Purple or maroon localized area of discolored intact skin or blood-filled blister due to damage of underlying soft tissue from pressure and/or shear. (Active)     Wound 08/11/24 0415 Pressure Injury Vertebral column Medial Stage 2 -  Partial thickness loss of dermis presenting as a shallow open injury with a red, pink wound bed without slough. (Active)   Nutritional Assessment: Body mass index is 35.16 kg/m.SABRA Seen by dietician.  I agree with the assessment and plan as outlined below: Nutrition Status: Nutrition Problem: Moderate Malnutrition Etiology: chronic illness (cancer) Signs/Symptoms: percent weight loss, mild muscle depletion, moderate muscle depletion (12% x 6 months) Percent weight loss: 12 % Interventions: Prostat, Tube feeding, MVI  . Skin Assessment: I have examined the patient's skin and I agree with the wound assessment as performed by the wound care RN as outlined below: Wound 07/25/24 1100 Pressure Injury Sacrum Medial Deep Tissue Pressure Injury - Purple or maroon localized area of discolored intact skin or blood-filled blister due to damage of underlying soft tissue  from pressure and/or shear. (Active)     Wound 08/11/24 0415 Pressure Injury Vertebral column Medial Stage 2 -  Partial thickness loss of dermis presenting as a shallow open injury with a red, pink wound bed without slough. (Active)    Consultants:  Neurology, palliative care  Procedures:  As above  Antimicrobials:  Anti-infectives (From admission, onward)    Start     Dose/Rate Route Frequency Ordered Stop   08/16/24  1100  cefTRIAXone  (ROCEPHIN ) 2 g in sodium chloride  0.9 % 100 mL IVPB        2 g 200 mL/hr over 30 Minutes Intravenous Every 24 hours 08/16/24 1001 08/20/24 1214   07/23/24 0600  vancomycin  (VANCOCIN ) IVPB 1000 mg/200 mL premix  Status:  Discontinued        1,000 mg 200 mL/hr over 60 Minutes Intravenous Every 12 hours 07/22/24 1834 07/24/24 1808   07/22/24 2200  meropenem  (MERREM ) 2 g in sodium chloride  0.9 % 100 mL IVPB  Status:  Discontinued        2 g 280 mL/hr over 30 Minutes Intravenous Every 8 hours 07/22/24 1601 07/24/24 1808   07/22/24 1700  vancomycin  (VANCOREADY) IVPB 1500 mg/300 mL        1,500 mg 150 mL/hr over 120 Minutes Intravenous  Once 07/22/24 1601 07/22/24 2031   07/22/24 1645  ampicillin  (OMNIPEN) 2 g in sodium chloride  0.9 % 100 mL IVPB  Status:  Discontinued        2 g 300 mL/hr over 20 Minutes Intravenous Every 6 hours 07/22/24 1557 07/24/24 1800   07/19/24 0530  meropenem  (MERREM ) 1 g in sodium chloride  0.9 % 100 mL IVPB  Status:  Discontinued        1 g 200 mL/hr over 30 Minutes Intravenous Every 8 hours 07/19/24 0438 07/22/24 1601   07/16/24 0900  ceFEPIme  (MAXIPIME ) 2 g in sodium chloride  0.9 % 100 mL IVPB  Status:  Discontinued        2 g 200 mL/hr over 30 Minutes Intravenous Every 8 hours 07/16/24 0811 07/19/24 0409   07/16/24 0815  ceFEPIme  (MAXIPIME ) 2 g in sodium chloride  0.9 % 100 mL IVPB  Status:  Discontinued        2 g 200 mL/hr over 30 Minutes Intravenous  Once 07/16/24 0803 07/16/24 0811   07/16/24 0330  cefTRIAXone  (ROCEPHIN ) 2 g in sodium chloride  0.9 % 100 mL IVPB        2 g 200 mL/hr over 30 Minutes Intravenous  Once 07/16/24 0315 07/16/24 0412         Subjective: Patient seen and examined.  Patient is more lethargic today, tries to keep eyes but unable to speak.  Unable to follow commands.  Objective: Vitals:   08/27/24 0011 08/27/24 0357 08/27/24 0500 08/27/24 0744  BP: (!) 130/53 (!) 124/52  (!) 116/57  Pulse: 66 66  68  Resp:    18   Temp: 99.1 F (37.3 C) 99.3 F (37.4 C)  98.5 F (36.9 C)  TempSrc: Axillary Axillary    SpO2: 100% 100%  100%  Weight:   87.2 kg   Height:        Intake/Output Summary (Last 24 hours) at 08/27/2024 1137 Last data filed at 08/27/2024 1123 Gross per 24 hour  Intake 525 ml  Output 925 ml  Net -400 ml   Filed Weights   08/25/24 1458 08/26/24 0500 08/27/24 0500  Weight: 89.9 kg 88.1 kg 87.2 kg    Examination:  General exam: Appears very lethargic. Respiratory system: Clear to auscultation. Respiratory effort normal. Cardiovascular system: S1 & S2 heard, RRR. No JVD, murmurs, rubs, gallops or clicks. No pedal edema. Gastrointestinal system: Abdomen is nondistended, soft and nontender. No organomegaly or masses felt. Normal bowel sounds heard. Central nervous system: Very lethargic. Data Reviewed: I have personally reviewed following labs and imaging studies  CBC: Recent Labs  Lab 08/21/24 0634 08/21/24 1130 08/22/24 0500 08/22/24 1618 08/23/24 0350 08/25/24 0744 08/26/24 0540  WBC 4.5  --  5.3  --  5.2 5.5 5.1  NEUTROABS 3.0  --  3.9  --  3.3 3.8  --   HGB 8.0*   < > 7.6* 8.0* 8.0* 9.1* 8.1*  HCT 25.3*   < > 24.0* 25.3* 25.3* 28.3* 26.1*  MCV 95.8  --  95.6  --  94.8 96.9 98.5  PLT 182  --  170  --  191 198 163   < > = values in this interval not displayed.   Basic Metabolic Panel: Recent Labs  Lab 08/21/24 0634 08/22/24 0500 08/23/24 0350 08/25/24 0744  NA 138 136 136 137  K 4.4 4.6 4.4 4.5  CL 104 106 104 103  CO2 29 27 28 27   GLUCOSE 199* 358* 229* 179*  BUN 22 21 20 13   CREATININE 0.39* 0.39* 0.35* 0.33*  CALCIUM  8.1* 8.0* 8.1* 8.4*  MG 2.1 1.9 1.9  --   PHOS 2.7 1.9* 2.1*  --    GFR: Estimated Creatinine Clearance: 75.7 mL/min (A) (by C-G formula based on SCr of 0.33 mg/dL (L)). Liver Function Tests: Recent Labs  Lab 08/21/24 0634 08/22/24 0500 08/23/24 0350  AST 55* 33 41  ALT 167* 64* 72*  ALKPHOS 178* 69 83  BILITOT 0.2 0.3 0.3  PROT  4.9* 4.2* 4.5*  ALBUMIN  2.8* 3.4* 3.2*   No results for input(s): LIPASE, AMYLASE in the last 168 hours. No results for input(s): AMMONIA in the last 168 hours. Coagulation Profile: No results for input(s): INR, PROTIME in the last 168 hours.  Cardiac Enzymes: No results for input(s): CKTOTAL, CKMB, CKMBINDEX, TROPONINI in the last 168 hours. BNP (last 3 results) Recent Labs    05/22/24 0630  PROBNP 3,514.0*   HbA1C: No results for input(s): HGBA1C in the last 72 hours. CBG: Recent Labs  Lab 08/26/24 1926 08/27/24 0007 08/27/24 0400 08/27/24 0741 08/27/24 1117  GLUCAP 140* 122* 131* 172* 195*   Lipid Profile: No results for input(s): CHOL, HDL, LDLCALC, TRIG, CHOLHDL, LDLDIRECT in the last 72 hours. Thyroid  Function Tests: No results for input(s): TSH, T4TOTAL, FREET4, T3FREE, THYROIDAB in the last 72 hours. Anemia Panel: No results for input(s): VITAMINB12, FOLATE, FERRITIN, TIBC, IRON , RETICCTPCT in the last 72 hours. Sepsis Labs: No results for input(s): PROCALCITON, LATICACIDVEN in the last 168 hours.  No results found for this or any previous visit (from the past 240 hours).    Radiology Studies: No results found.  Scheduled Meds:  amLODipine   10 mg Per Tube Daily   atorvastatin   80 mg Per Tube QHS   Chlorhexidine  Gluconate Cloth  6 each Topical Daily   free water   75 mL Per Tube Q4H   gabapentin   100 mg Per Tube Q8H   insulin  aspart  0-9 Units Subcutaneous Q4H   insulin  glargine  15 Units Subcutaneous BID   levETIRAcetam   500 mg Intravenous Q12H   lidocaine   1 patch Transdermal Daily   mouth rinse  15 mL Mouth Rinse 4 times  per day   oxyCODONE   5 mg Per Tube Q6H   pantoprazole  (PROTONIX ) IV  40 mg Intravenous Q12H   propranolol   20 mg Per Tube TID   sodium chloride  flush  10-40 mL Intracatheter Q12H   Continuous Infusions:  citrate dextrose      citrate dextrose      feeding supplement  (GLUCERNA 1.2 CAL)       LOS: 41 days   Fredia Skeeter, MD [facility]  08/27/2024, 11:37 AM   *Please note that this is a verbal dictation therefore any spelling or grammatical errors are due to the Dragon Medical One system interpretation.  Please page via Amion and do not message via secure chat for urgent patient care matters. Secure chat can be used for non urgent patient care matters.  How to contact the [facility] Attending or Consulting provider 7A - 7P or covering provider during after hours 7P -7A, for this patient?  Check the care team in [facility] and look for a) attending/consulting [facility] provider listed and b) the [facility] team listed. Page or secure chat 7A-7P. Log into [email or web address] and use [place]'s universal password to access. If you do not have the password, please contact the hospital operator. Locate the [facility] provider you are looking for under [facility] and page to a number that you can be directly reached. If you still have difficulty reaching the provider, please page the [facility] (Director on Call) for the Hospitalists listed on amion for assistance.  "

## 2024-08-27 NOTE — Plan of Care (Signed)
 Problem: Education: Goal: Ability to describe self-care measures that may prevent or decrease complications (Diabetes Survival Skills Education) will improve Outcome: Not Progressing Goal: Individualized Educational Video(s) Outcome: Not Progressing   Problem: Coping: Goal: Ability to adjust to condition or change in health will improve Outcome: Not Progressing   Problem: Fluid Volume: Goal: Ability to maintain a balanced intake and output will improve Outcome: Not Progressing   Problem: Health Behavior/Discharge Planning: Goal: Ability to identify and utilize available resources and services will improve Outcome: Not Progressing Goal: Ability to manage health-related needs will improve Outcome: Not Progressing   Problem: Metabolic: Goal: Ability to maintain appropriate glucose levels will improve Outcome: Not Progressing   Problem: Nutritional: Goal: Maintenance of adequate nutrition will improve Outcome: Not Progressing Goal: Progress toward achieving an optimal weight will improve Outcome: Not Progressing   Problem: Skin Integrity: Goal: Risk for impaired skin integrity will decrease Outcome: Not Progressing   Problem: Tissue Perfusion: Goal: Adequacy of tissue perfusion will improve Outcome: Not Progressing   Problem: Education: Goal: Knowledge of General Education information will improve Description: Including pain rating scale, medication(s)/side effects and non-pharmacologic comfort measures Outcome: Not Progressing   Problem: Health Behavior/Discharge Planning: Goal: Ability to manage health-related needs will improve Outcome: Not Progressing   Problem: Clinical Measurements: Goal: Ability to maintain clinical measurements within normal limits will improve Outcome: Not Progressing Goal: Will remain free from infection Outcome: Not Progressing Goal: Diagnostic test results will improve Outcome: Not Progressing Goal: Respiratory complications will  improve Outcome: Not Progressing Goal: Cardiovascular complication will be avoided Outcome: Not Progressing   Problem: Activity: Goal: Risk for activity intolerance will decrease Outcome: Not Progressing   Problem: Nutrition: Goal: Adequate nutrition will be maintained Outcome: Not Progressing   Problem: Coping: Goal: Level of anxiety will decrease Outcome: Not Progressing   Problem: Elimination: Goal: Will not experience complications related to bowel motility Outcome: Not Progressing Goal: Will not experience complications related to urinary retention Outcome: Not Progressing   Problem: Pain Managment: Goal: General experience of comfort will improve and/or be controlled Outcome: Not Progressing   Problem: Safety: Goal: Ability to remain free from injury will improve Outcome: Not Progressing   Problem: Skin Integrity: Goal: Risk for impaired skin integrity will decrease Outcome: Not Progressing   Problem: Education: Goal: Knowledge of General Education information will improve Description: Including pain rating scale, medication(s)/side effects and non-pharmacologic comfort measures Outcome: Not Progressing   Problem: Health Behavior/Discharge Planning: Goal: Ability to manage health-related needs will improve Outcome: Not Progressing   Problem: Clinical Measurements: Goal: Ability to maintain clinical measurements within normal limits will improve Outcome: Not Progressing Goal: Will remain free from infection Outcome: Not Progressing Goal: Diagnostic test results will improve Outcome: Not Progressing Goal: Respiratory complications will improve Outcome: Not Progressing Goal: Cardiovascular complication will be avoided Outcome: Not Progressing   Problem: Activity: Goal: Risk for activity intolerance will decrease Outcome: Not Progressing   Problem: Nutrition: Goal: Adequate nutrition will be maintained Outcome: Not Progressing   Problem:  Coping: Goal: Level of anxiety will decrease Outcome: Not Progressing   Problem: Elimination: Goal: Will not experience complications related to bowel motility Outcome: Not Progressing Goal: Will not experience complications related to urinary retention Outcome: Not Progressing   Problem: Pain Managment: Goal: General experience of comfort will improve and/or be controlled Outcome: Not Progressing   Problem: Safety: Goal: Ability to remain free from injury will improve Outcome: Not Progressing   Problem: Skin Integrity: Goal: Risk for impaired skin integrity  will decrease Outcome: Not Progressing

## 2024-08-27 NOTE — Plan of Care (Signed)
" °  Problem: Fluid Volume: Goal: Ability to maintain a balanced intake and output will improve Outcome: Progressing   Problem: Health Behavior/Discharge Planning: Goal: Ability to manage health-related needs will improve Outcome: Progressing   Problem: Nutritional: Goal: Maintenance of adequate nutrition will improve Outcome: Progressing   Problem: Skin Integrity: Goal: Risk for impaired skin integrity will decrease Outcome: Progressing   Problem: Clinical Measurements: Goal: Will remain free from infection Outcome: Progressing   Problem: Activity: Goal: Risk for activity intolerance will decrease Outcome: Progressing   Problem: Coping: Goal: Level of anxiety will decrease Outcome: Progressing   Problem: Elimination: Goal: Will not experience complications related to bowel motility Outcome: Progressing   Problem: Pain Managment: Goal: General experience of comfort will improve and/or be controlled Outcome: Progressing   Problem: Safety: Goal: Ability to remain free from injury will improve Outcome: Progressing   "

## 2024-08-28 DIAGNOSIS — N390 Urinary tract infection, site not specified: Secondary | ICD-10-CM | POA: Diagnosis not present

## 2024-08-28 DIAGNOSIS — T45AX4A Poisoning by immune checkpoint inhibitors and immunostimulant drugs, undetermined, initial encounter: Secondary | ICD-10-CM | POA: Diagnosis not present

## 2024-08-28 DIAGNOSIS — G2582 Stiff-man syndrome: Secondary | ICD-10-CM | POA: Diagnosis not present

## 2024-08-28 DIAGNOSIS — C7982 Secondary malignant neoplasm of genital organs: Secondary | ICD-10-CM | POA: Diagnosis not present

## 2024-08-28 DIAGNOSIS — R410 Disorientation, unspecified: Secondary | ICD-10-CM | POA: Diagnosis not present

## 2024-08-28 DIAGNOSIS — T45AX5A Adverse effect of immune checkpoint inhibitors and immunostimulant drugs, initial encounter: Secondary | ICD-10-CM | POA: Diagnosis not present

## 2024-08-28 DIAGNOSIS — G9341 Metabolic encephalopathy: Secondary | ICD-10-CM | POA: Diagnosis not present

## 2024-08-28 LAB — GLUCOSE, CAPILLARY
Glucose-Capillary: 125 mg/dL — ABNORMAL HIGH (ref 70–99)
Glucose-Capillary: 139 mg/dL — ABNORMAL HIGH (ref 70–99)
Glucose-Capillary: 153 mg/dL — ABNORMAL HIGH (ref 70–99)
Glucose-Capillary: 153 mg/dL — ABNORMAL HIGH (ref 70–99)
Glucose-Capillary: 164 mg/dL — ABNORMAL HIGH (ref 70–99)
Glucose-Capillary: 174 mg/dL — ABNORMAL HIGH (ref 70–99)

## 2024-08-28 MED ORDER — POLYETHYLENE GLYCOL 3350 17 G PO PACK
17.0000 g | PACK | Freq: Every day | ORAL | Status: DC
  Administered 2024-08-28 – 2024-09-05 (×5): 17 g via ORAL
  Filled 2024-08-28 (×2): qty 1

## 2024-08-28 NOTE — Progress Notes (Signed)
 IP PROGRESS NOTE  Subjective:   [name] is more alert.  Her daughter is at the bedside.  Her daughter feels she is less rigid, more verbal, and no longer has tremors.  The bedside RN reports she has been yelling out during the night, unclear whether she is having pain or confusion.   Objective: Vital signs in last 24 hours: Blood pressure (!) 133/46, pulse 71, temperature 99 F (37.2 C), temperature source Oral, resp. rate 18, height 5' 2 (1.575 m), weight 192 lb 3.9 oz (87.2 kg), last menstrual period 10/30/2014, SpO2 99%.  Intake/Output from previous day: 12/25 0701 - 12/26 0700 In: 865.8 [NG/GT:830.8; IV Piggyback:35] Out: 225 [Urine:225]  Physical Exam: Alert Neurologic: Speaks a few words, not speaking in sentences, not following commands.  Confused.  Minimal grip strength with the left hand, withdraws toes to noxious stimuli   Portacath/PICC-without erythema  Lab Results: Recent Labs    08/26/24 0540 08/27/24 1443  WBC 5.1 5.4  HGB 8.1* 8.8*  9.2*  HCT 26.1* 27.7*  27.0*  PLT 163 185    BMET Recent Labs    08/27/24 1443  NA 138  140  K 4.3  4.3  CL 102  98  CO2 29  GLUCOSE 167*  167*  BUN 13  12  CREATININE 0.31*  0.40*  CALCIUM  8.9     Medications: I have reviewed the patient's current medications.  Assessment/Plan:  Endometrial cancer Diagnosed in 2023, status post surgery and radiation Recurrent disease July 2025 with a left retroperitoneal mass, biopsy confirmed adenocarcinoma, ER positive, MSI-high, tumor mutation burden 24, PD-L1 75%, PIK 3CA mutated, PTEN mutated, HER2 1+, loss of MLH1 expression 04/03/2024 CT chest: Spiculated 9 x 12 mm right upper lobe and part solid 4 x 6 mm right upper lobe nodules Cycle 1 paclitaxel /carboplatin /pembrolizumab  05/01/2024 Cycle 2 paclitaxel /carboplatin /pembrolizumab  06/30/2024 06/19/2024 CT abdomen/pelvis: Right lower lobe pulmonary embolism, splenic infarct, stable size of the left psoas mass with  evidence of erosive change at the anterior aspect of L2, decreased size of left external iliac node 08/09/2024 CTs: New psoas muscle mass (tumor versus hematoma), left psoas/paraspinal mass has decreased in size, by my review the previously noted lung nodules have resolved  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  DVT 06/02/2024 4.  Pulmonary embolism noted on CT chest 06/19/2024 5.  Diabetes 6.  CHF/CAD 7.  Seizures, cefepime  toxicity?  November 2025 8.  Progressive generalized loss of motor function-related to CVAs?,  Paraneoplastic? 08/11/2024-Solu-Medrol  daily x 3 Therapy plasma exchange 08/21/2024, 08/23/2024 9.  Respiratory failure 10.  Pain secondary to #1 11.  Anemia-stable 12.  Elevated liver enzymes  [name] has a history of endometrial cancer initially diagnosed in 2020.  She was diagnosed with recurrent disease involving a left retroperitoneal mass in July 2025.  She completed 2 treatments with paclitaxel /carboplatin /pembrolizumab .  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  She completed 2 cycles of systemic therapy for treatment of the uterine cancer.  I reviewed the 08/09/2024 restaging CT findings with [name].  The known malignancy at the left retroperitoneum has improved significantly.  The significance of the right psoas mass is unclear.  I suspect this is a benign finding.  The etiology of the diffuse generalized weakness is unclear.  There is likely a component related to the embolic CVAs and deconditioning, but the evolution of the loss of motor function over the past several weeks appears to be more severe than expected.  She has  undergone an extensive neurologic evaluation over the past month.  Neurology is following her in the hospital..  Neurology feels her symptoms may be related to PD-1 inhibitor neurotoxicity, a paraneoplastic syndrome, or another inflammatory process.  She started high-dose Solu-Medrol  on 08/11/2024, given daily for 3 days.  Her neurologic status did not improve.  She began plasma exchange on 08/21/2024.  She has completed 4 treatments to date.  The etiology of her pain is unclear based on the restaging CT.  The retroperitoneal mass is much smaller.  She could be having pain from the right psoas lesion or another etiology.    She has persistent anemia.  The anemia is multifactorial including components related to bleeding, phlebotomy, and chronic disease  Recommendations: Continue narcotic analgesics and anxiolytics as needed for agitation/anxiety and pain Continue evaluation of her mental status, seizures, and motor weakness per neurology, continue plasma exchange as recommended by neurology Anticoagulation on hold after the episode of rectal bleeding, resume per the medical service  tube feedings, follow-up with speech pathology Systemic treatment for the uterine cancer will remain on hold Continue goals of care discussions with palliative care Please call oncology as needed, I discussed the situation with her daughter and I am available to speak with her husband if desired.  I will check on her 08/31/2024.            LOS: 42 days   Arley Hof, MD   08/28/2024, 7:45 AM

## 2024-08-28 NOTE — Progress Notes (Addendum)
 NEUROLOGY CONSULT FOLLOW UP NOTE   Date of service: August 28, 2024 Patient Name: Brenda [name] MRN:  [id number] DOB:  1961/03/21  Interval Hx/subjective  Was seen by Oncology yesterday.   Vitals   Vitals:   08/27/24 1619 08/27/24 1629 08/27/24 2124 08/28/24 0733  BP: (!) 112/50 (!) 126/90 (!) 109/56 (!) 133/46  Pulse: 67 67 64 71  Resp: 13 12 16 18   Temp:  98.6 F (37 C) 98 F (36.7 C) 99 F (37.2 C)  TempSrc:  Axillary Oral Oral  SpO2: 100% 100% 100% 99%  Weight:      Height:         Body mass index is 35.16 kg/m.  Physical Exam   General: Lying in bed with eyes closed, moaning periodically, exclaiming stop, stop and other short phrases indicative of discomfort. Also calling out the name of a person who is not present.  Constitutional: Skin is warm to forehead and scalp as well as diaphoretic. Dependent portions of limbs in contact with her covered mattress are also warm and diaphoretic.  Eyes: No scleral injection.  HENT: No OP obstrucion.  Head: Normocephalic. Neck muscle stiffness is noted when attempting to passively flex and rotate her neck Respiratory: Effort normal, non-labored breathing.  Skin: No edema to lower extremities.   Neurologic Examination   Mental Status: Patient is in a state of agitated delirium with eyes closed, moaning periodically, exclaiming stop, stop, no, no, no, no and other short phrases indicative of discomfort. When asked questions she at times will seem to be responding to the question, but with expressed thought content consistent with imagined circumstances. She will state yes when asked if she is in pain, but otherwise cannot localize it, even when prompted with multiple choice format (e.g. is your head hurting?, does it hurt or feel better when I rub your foot?). Also calling out the name of a person who is not present. She does not attempt to answer any orientation questions, replying only with perseverative phrases  indicative of pain/discomfort.  Cranial Nerves:  II: No blink to threat. Left pupil 5 mm, ovoid and minimally reactive. Right pupil 2 mm and reactive. III, IV, VI: Does not focus or track examiner. Eyes are conjugate and near the midline VII: Face is symmetrical resting and speaking VIII: Hearing intact to voice. IX, X: Phonation is without hoarseness or hypopohonia.  Motor: Able to move limbs in response to tactile stimuli and also withdraws/flails slightly when examiner tests passive ROM, which elicits painful cries such as it hurts and no, no, no. There is apparent pain in all of her limbs that is elicited by passive movement, as well as with light squeezing of her muscle beds. Squeezing of her joints does not elicit any similar responses indicative of pain.  Tone is increased in bilateral upper and lower extremities without asymmetry.  Sensation: Reacts to touch and pressure, mostly with pained responses   Reflexes: Trace brachioradialis bilaterally. Unable to elicit patellars. Toes upgoing bilaterally Coordination: Unable to perform Gait- Unable to assess  Medications Current Medications[1]  Labs and Diagnostic Imaging   CBC:  Recent Labs  Lab 08/23/24 0350 08/25/24 0744 08/26/24 0540 08/27/24 1443  WBC 5.2 5.5 5.1 5.4  NEUTROABS 3.3 3.8  --   --   HGB 8.0* 9.1* 8.1* 8.8*  9.2*  HCT 25.3* 28.3* 26.1* 27.7*  27.0*  MCV 94.8 96.9 98.5 96.9  PLT 191 198 163 185    Basic Metabolic Panel:  Lab  Results  Component Value Date   NA 138 08/27/2024   NA 140 08/27/2024   K 4.3 08/27/2024   K 4.3 08/27/2024   CO2 29 08/27/2024   GLUCOSE 167 (H) 08/27/2024   GLUCOSE 167 (H) 08/27/2024   BUN 13 08/27/2024   BUN 12 08/27/2024   CREATININE 0.31 (L) 08/27/2024   CREATININE 0.40 (L) 08/27/2024   CALCIUM  8.9 08/27/2024   GFRNONAA >60 08/27/2024   GFRAA 94 12/16/2019   Lipid Panel:  Lab Results  Component Value Date   LDLCALC 28 05/30/2024   HgbA1c:  Lab Results   Component Value Date   HGBA1C 7.2 (H) 06/01/2024   Urine Drug Screen:     Component Value Date/Time   LABOPIA POSITIVE (A) 04/20/2024 1817   COCAINSCRNUR NONE DETECTED 04/20/2024 1817   LABBENZ NONE DETECTED 04/20/2024 1817   AMPHETMU NONE DETECTED 04/20/2024 1817   THCU NONE DETECTED 04/20/2024 1817   LABBARB NONE DETECTED 04/20/2024 1817    Alcohol Level     Component Value Date/Time   ETH <15 04/20/2024 1344   INR  Lab Results  Component Value Date   INR 1.0 08/18/2024   APTT  Lab Results  Component Value Date   APTT 67 (H) 06/21/2024   Autoimmune encephalopathy serum panel negative, including anti-amphiphysin antibodiy GAD 65 antibody negative   MRI Brain, 08/09/24 (Personally reviewed): Multifocally evolving infarcts in the anterior and posterior circulation including small infarcts in the bilateral frontoparietal white matter and a medium-sized infarct in the right occipital lobe with developing encephalomalacia and resolving restricted diffusion. No evidence of new/interval acute infarct. No evidence of metastatic disease.  Assessment  Yvanna Vidas is a 63 y.o. 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 Her weakness was preventing her from ambulating. She was started on cefepime . Neurology was initially consulted on 11/16 for worsening encephalopathy that started 3 days after admission, along with tremors in BL uppers. Initially the tremors were intermittent and would go away when she would sleep but then became present continuously while awake.  There was concern for cefepime  toxicity causing seizures, for which she was started on Keppra ; cefepime  was discontinued. She continued to have neurological  worsening despite cessation of seizures and no additional new strokes on repeat imaging.  Cervical spine imaging was also unremarkable for acute process. Neurology was re-consulted for worsening limb stiffness. 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  - Serial examinations this week have been with slight improvement since PLEX was started, but exam today reveals florid delirium. She will not open eyes or interact other than to cry out in pain with attempts to assess her motor function and exclaims with repetitive short phrases that correspond mostly with what seem to be internal stimuli in the setting of her delirium.   - Impression: - Stiffness on her exam coupled with altered mental status is of unclear etiology. 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 Her initial exams with waxy flexibility (catalepsy) and decreased but intermittent verbalization could have been consistent with catatonia, but the  time course would be unsual and lack of response to Ativan  makes this much less likely. She now has limb stiffness more consistent with rigidity rather than catalepsy.  GAD 65 antibody was negative, but it is only positive in about two thirds of patients with stiff person syndrome and is generally negative in patients with paraneoplastic stiff person syndrome. Anti-amphiphysin antibodies,can also play a role in stiff person syndrome, but have also come back negative. - Steroids have been tried with no improvement and PLEX has been started empirically in case this is an irAE (aka ICI-iE) associated with Keytruda , or some other antibody-driven autoimmune process.  - Her CSF results do not favor acute  inflammatory demyelinating  polyneuropathy/Guillain-Barr type of pathology, nor does her clinical presentation/exam.  - CSF labs are not consistent with a meningitis or infectious encephalitis - The strokes seen on her MRI scans do not explain her clinical presentation   Recommendations  Fourth plasmapheresis today. PLEX 5/5 should be on Saturday.  Follow-up for pending paraneoplastic panels on serum and CSF Autoimmune encephalopathy serum panel has come back pan-negative Severe pain may also be due to her metastatic cancer. May benefit from pain management consult.  Neurology will continue to follow.  ______________________________________________________________________   Bonney SHARK, Norma Ignasiak, MD [facility]     [1]  Current Facility-Administered Medications:    ALPRAZolam  (XANAX ) tablet 0.25 mg, 0.25 mg, Per Tube, TID PRN, Cooper, Josseline P, PA-C, 0.25 mg at 08/27/24 2037   amLODipine  (NORVASC ) tablet 10 mg, 10 mg, Per Tube, Daily, Drusilla, Sabas RAMAN, MD, 10 mg at 08/27/24 1057   artificial tears (LACRILUBE) ophthalmic ointment, , Both Eyes, Q4H PRN, Tariq, Hassan, MD, Given at 08/11/24 2102   atorvastatin  (LIPITOR ) tablet 80 mg, 80 mg, Per Tube, QHS, Sigdel, Santosh, MD, 80 mg at 08/27/24 2039   Chlorhexidine  Gluconate Cloth 2 % PADS 6 each, 6 each, Topical, Daily, Chavez, Abigail, NP, 6 each at 08/27/24 1123   feeding supplement (GLUCERNA 1.2 CAL) liquid 1,000 mL, 1,000 mL, Per Tube, Continuous, Pahwani, Ravi, MD, Last Rate: 45 mL/hr at 08/27/24 1500, Infusion Verify at 08/27/24 1500   free water  75 mL, 75 mL, Per Tube, Q4H, Cosette Blackwater, MD, 75 mL at 08/28/24 0815   gabapentin  (NEURONTIN ) 250 MG/5ML solution 100 mg, 100 mg, Per Tube, Q8H, Perri DELENA Meliton Mickey., MD, 100 mg at 08/28/24 0815   guaiFENesin  (ROBITUSSIN) 100 MG/5ML liquid 15 mL, 15 mL, Per Tube, Q6H PRN, Drusilla Sabas RAMAN, MD, 15 mL at 08/25/24 9173   HYDROmorphone  (DILAUDID ) injection 0.5 mg, 0.5 mg,  Intravenous, Q2H PRN, 0.5 mg at 08/28/24 0526 **OR** [DISCONTINUED] HYDROmorphone  (DILAUDID ) injection 1 mg, 1 mg, Intravenous, Q2H PRN, Perri DELENA Meliton Mickey., MD   insulin  aspart (novoLOG ) injection 0-9 Units, 0-9 Units, Subcutaneous, Q4H, Perri DELENA Meliton Mickey., MD, 2 Units at 08/28/24 9183   insulin  glargine (LANTUS ) injection 15 Units, 15 Units, Subcutaneous, BID, Pahwani, Fredia, MD, 15 Units at 08/27/24 2248   levETIRAcetam  (KEPPRA ) undiluted injection 500 mg, 500 mg, Intravenous, Q12H, Howerter, Justin B, DO, 500 mg at 08/27/24 2037   lidocaine  (LIDODERM ) 5 % 1 patch, 1 patch, Transdermal, Daily, Alekh, Kshitiz, MD, 1 patch at 08/27/24 1123   LORazepam  (ATIVAN ) injection 1 mg, 1 mg, Intravenous, Q2H PRN, Cooper, Josseline P, PA-C, 1 mg at 08/28/24 9182   naloxone  (NARCAN ) injection 0.4 mg, 0.4 mg, Intravenous, PRN, Rathore, Vasundhra, MD   nystatin  (MYCOSTATIN /NYSTOP ) topical powder 1 Application, 100,000 Units, Topical, TID, Vernon Fredia, MD, 1 Application  at 08/27/24 2036   ondansetron  (ZOFRAN ) tablet 4 mg, 4 mg, Per Tube, Q6H PRN **OR** ondansetron  (ZOFRAN ) injection 4 mg, 4 mg, Intravenous, Q6H PRN, Cosette Blackwater, MD   Oral care mouth rinse, 15 mL, Mouth Rinse, 4 times per day, Perri DELENA Meliton Mickey., MD, 15 mL at 08/28/24 9182   Oral care mouth rinse, 15 mL, Mouth Rinse, PRN, Perri DELENA Meliton Mickey., MD, 15 mL at 08/21/24 1145   oxyCODONE  (Oxy IR/ROXICODONE ) immediate release tablet 2.5 mg, 2.5 mg, Oral, Q4H PRN **OR** oxyCODONE  (Oxy IR/ROXICODONE ) immediate release tablet 5 mg, 5 mg, Oral, Q4H PRN, Perri DELENA Meliton Mickey., MD, 5 mg at 08/27/24 9355   oxyCODONE  (Oxy IR/ROXICODONE ) immediate release tablet 5 mg, 5 mg, Per Tube, Q6H, 5 mg at 08/28/24 0817 **AND** [DISCONTINUED] oxyCODONE  (Oxy IR/ROXICODONE ) immediate release tablet 2.5 mg, 2.5 mg, Oral, Q4H PRN, Perri DELENA Meliton Mickey., MD, 2.5 mg at 08/20/24 1122   pantoprazole  (PROTONIX ) injection 40 mg, 40 mg, Intravenous, Q12H, Perri DELENA Meliton Mickey., MD, 40 mg at 08/27/24 2037   prochlorperazine  (COMPAZINE ) injection 10 mg, 10 mg, Intravenous, Q6H PRN, Howerter, Justin B, DO   propranolol  (INDERAL ) tablet 20 mg, 20 mg, Per Tube, TID, Sigdel, Santosh, MD, 20 mg at 08/27/24 2037   senna-docusate (Senokot-S) tablet 1 tablet, 1 tablet, Oral, QHS PRN, Foust, Katy L, NP, 1 tablet at 08/23/24 1744   sodium chloride  flush (NS) 0.9 % injection 10-40 mL, 10-40 mL, Intracatheter, Q12H, Sigdel, Santosh, MD, 10 mL at 08/27/24 2040   sodium chloride  flush (NS) 0.9 % injection 10-40 mL, 10-40 mL, Intracatheter, PRN, Sigdel, Santosh, MD

## 2024-08-28 NOTE — Plan of Care (Signed)
 Problem: Education: Goal: Ability to describe self-care measures that may prevent or decrease complications (Diabetes Survival Skills Education) will improve Outcome: Not Progressing Goal: Individualized Educational Video(s) Outcome: Not Progressing   Problem: Coping: Goal: Ability to adjust to condition or change in health will improve Outcome: Not Progressing   Problem: Fluid Volume: Goal: Ability to maintain a balanced intake and output will improve Outcome: Not Progressing   Problem: Health Behavior/Discharge Planning: Goal: Ability to identify and utilize available resources and services will improve Outcome: Not Progressing Goal: Ability to manage health-related needs will improve Outcome: Not Progressing   Problem: Metabolic: Goal: Ability to maintain appropriate glucose levels will improve Outcome: Not Progressing   Problem: Nutritional: Goal: Maintenance of adequate nutrition will improve Outcome: Not Progressing Goal: Progress toward achieving an optimal weight will improve Outcome: Not Progressing   Problem: Skin Integrity: Goal: Risk for impaired skin integrity will decrease Outcome: Not Progressing   Problem: Tissue Perfusion: Goal: Adequacy of tissue perfusion will improve Outcome: Not Progressing   Problem: Education: Goal: Knowledge of General Education information will improve Description: Including pain rating scale, medication(s)/side effects and non-pharmacologic comfort measures Outcome: Not Progressing   Problem: Health Behavior/Discharge Planning: Goal: Ability to manage health-related needs will improve Outcome: Not Progressing   Problem: Clinical Measurements: Goal: Ability to maintain clinical measurements within normal limits will improve Outcome: Not Progressing Goal: Will remain free from infection Outcome: Not Progressing Goal: Diagnostic test results will improve Outcome: Not Progressing Goal: Respiratory complications will  improve Outcome: Not Progressing Goal: Cardiovascular complication will be avoided Outcome: Not Progressing   Problem: Activity: Goal: Risk for activity intolerance will decrease Outcome: Not Progressing   Problem: Nutrition: Goal: Adequate nutrition will be maintained Outcome: Not Progressing   Problem: Coping: Goal: Level of anxiety will decrease Outcome: Not Progressing   Problem: Elimination: Goal: Will not experience complications related to bowel motility Outcome: Not Progressing Goal: Will not experience complications related to urinary retention Outcome: Not Progressing   Problem: Pain Managment: Goal: General experience of comfort will improve and/or be controlled Outcome: Not Progressing   Problem: Safety: Goal: Ability to remain free from injury will improve Outcome: Not Progressing   Problem: Skin Integrity: Goal: Risk for impaired skin integrity will decrease Outcome: Not Progressing   Problem: Education: Goal: Knowledge of General Education information will improve Description: Including pain rating scale, medication(s)/side effects and non-pharmacologic comfort measures Outcome: Not Progressing   Problem: Health Behavior/Discharge Planning: Goal: Ability to manage health-related needs will improve Outcome: Not Progressing   Problem: Clinical Measurements: Goal: Ability to maintain clinical measurements within normal limits will improve Outcome: Not Progressing Goal: Will remain free from infection Outcome: Not Progressing Goal: Diagnostic test results will improve Outcome: Not Progressing Goal: Respiratory complications will improve Outcome: Not Progressing Goal: Cardiovascular complication will be avoided Outcome: Not Progressing   Problem: Activity: Goal: Risk for activity intolerance will decrease Outcome: Not Progressing   Problem: Nutrition: Goal: Adequate nutrition will be maintained Outcome: Not Progressing   Problem:  Coping: Goal: Level of anxiety will decrease Outcome: Not Progressing   Problem: Elimination: Goal: Will not experience complications related to bowel motility Outcome: Not Progressing Goal: Will not experience complications related to urinary retention Outcome: Not Progressing   Problem: Pain Managment: Goal: General experience of comfort will improve and/or be controlled Outcome: Not Progressing   Problem: Safety: Goal: Ability to remain free from injury will improve Outcome: Not Progressing   Problem: Skin Integrity: Goal: Risk for impaired skin integrity  will decrease Outcome: Not Progressing

## 2024-08-28 NOTE — Progress Notes (Signed)
 " PROGRESS NOTE    Brenda [name]  FMW:[id number] DOB: [birth year] DOA: 07/15/2024 PCP: [facility]   Brief Narrative:  63 year old female with history of metastatic endometrial cancer, chronic cancer-related pain, hypertension, hyperlipidemia, diabetes mellitus, CVA, history of DVT and PE anticoagulated on Lovenox , OSA, chronic hypoxic respiratory failure on 2L via nasal cannula and recent hospitalization and discharged on 07/14/2024 for UTI treated with IV antibiotics followed by oral antibiotics on discharge presented with chills and weakness.  She was found to have UTI and was started on IV cefepime .  Unfortunately mental status worsened and she was transferred to [facility] for continuous EEG monitoring with 4 seizures noted on video EEG. She was on Keppra  11/16. Placed on empiric broad-spectrum antibiotics for possible meningitis/encephalitis which was ruled out based on CSF results. .    Neurology has been reconsulted 12/7 with development of rigidity.  Oncology has been reconsulted.  Family has begun to discuss palliative care, but at this point managing her cancer related pain and working on rigidity with neurology.  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 4 seizures noted 11/16. S/p LP, meningitis ruled out Her  husband notes worsening stiffness/rigidity/inability to move extremities starting about 1 week ago. EEG without epileptiform abnormalities. head CT without acute intracranial abnormality. MRI brain with evolving infarcts - MRI C spine without evidence of metastatic disease.  Remains on Keppra .  Neurology managing.  Per neurology GAD 65 antibody was negative, but it is only positive in about two thirds of patients with stiff person syndrome and is generally negative in patients with paraneoplastic stiff person syndrome. Anti-amphiphysin antibodies,can also play a role in stiff person syndrome, but have come back negative steroids have been tried with no improvement. appreciate neurology recommendations - ddx includes PD-1 inhibitor induced side effect, paraneoplatic syndrome, stiff person syndrome.  Considering catatonia.  We've stopped prozac  (low dose, lower suspicion for serotonin syndrome?).  S/p high dose steroids 12/9-11.  S/p trial sinimet.  S/p baclofen .  Follow paraneoplastic panel, GAD65 ab.  S/p PLEX #1 on 12/19 and #2 on 08/23/2024 and #3 on 08/25/2024 and 4th on 08/27/2024, per neurology, plan for total 5 days of treatment.   Acute Metabolic Encephalopathy Intermittently encephalopathic. Delirium precautions  issues with somnolence on fentanyl , will balance need for opiates with sedation    Anemia / Bright Red Blood Per Rectum BRBPR resolved at this time - anticoagulation on hold  CT GI bleed study without evidence GI bleed. IV PPI BID S/p 1 unit pRBC 12/1 - S/p IV iron . Normal folate, b12.  Normal iron , ferritin.  Hemoglobin fairly stable.   Klebsiella Pneumoniae UTI Treated with abx for UTI - s/p 5 day course Blood cultures NGx2   Metastatic Endometrial Cancer / Cancer Related Pain  Appreciate oncology consult  CT chest/abdomen/pelvis for persistent (  worsening?) back pain (note centrally necrotic infiltrative mass in L psoas in 06/2024) CT 12/7 with R psoas muscle mass like  enlargement measuring 5.0 x 4.6 cm indeterminate for tumor vs hematoma  - L psoas/paraspinal mass decreased in size.   CT 12/16 with R psoas muscle hematoma (discussed with IR 12/19, who noted nothing to drain), L2 sclerotic changes with L paraspinal masslike tissue at L2 concerning for neoplastic process Appreciate oncology needs - last saw 08/24/2024. Appreciate palliative care for adjusting the pain medications.   Elevated LFT's Monitor - relatively mild, will trend for now   Dysphagia Cortrak + tube feeds - eventually decision regarding PEG will need to be made, but will hold off for now with our trial of plex   Depression Anxiety Prozac  discontinued with rigidity   Pulmonary Embolism Splenic Infarct Lovenox  on hold with GI bleed and psoas hematoma above   CAD  Hx Stroke Lovenox  on hold   Hypernatremia Resolved   Abnormal Thyroid  Function Tests Repeat TSH and free T4 wnl Negative TSI On propanolol    T2DM Currently on Lantus  15 units twice daily and SSI, blood sugar fairly controlled.  Goals of Care Mrs. Yott and her husband beginning to talk about palliative care on the day I met them (12/7).  I had a conversation with them about comfort focused care and hospice, but at this point, we're trying to work up and treat the acute issues below (with regards to her rigidity and encephalopathy).  At this point in time, husband and the family are hoping to trial PLEX.  Will do our best to keep her as comfortable as possible as we pursue this treatment option with neurology assistance.  Per palliative, husband asking about transfer if she's not improved with this but not sure if he says that when he is emotional or not.  Will address once 5 days of Plex is completed.  Appreciate palliative care following this patient.  DVT prophylaxis: Place and maintain sequential compression device Start: 08/02/24 0738   Code Status: Limited: Do not attempt resuscitation (DNR) -DNR-LIMITED -Do  Not Intubate/DNI   Family Communication: None at bedside.  Status is: Inpatient Remains inpatient appropriate because: Encephalopathic   Estimated body mass index is 35.16 kg/m as calculated from the following:   Height as of this encounter: 5' 2 (1.575 m).   Weight as of this encounter: 87.2 kg.  Wound 07/25/24 1100 Pressure Injury Sacrum Medial Deep Tissue Pressure Injury - Purple or maroon localized area of discolored intact skin or blood-filled blister due to damage of underlying soft tissue from pressure and/or shear. (Active)     Wound 08/11/24 0415 Pressure Injury Vertebral column Medial Stage 2 -  Partial thickness loss of dermis presenting as a shallow open injury with a red, pink wound bed without slough. (Active)   Nutritional Assessment: Body mass index is 35.16 kg/m.SABRA Seen by dietician.  I agree with the assessment and plan as outlined below: Nutrition Status: Nutrition Problem: Moderate Malnutrition Etiology: chronic illness (cancer) Signs/Symptoms: percent weight loss, mild muscle depletion, moderate muscle depletion (12% x 6 months) Percent weight loss: 12 % Interventions: Prostat, Tube feeding, MVI  . Skin Assessment: I have examined the patient's skin and I agree with the wound assessment as performed by the wound care RN as outlined below: Wound 07/25/24 1100 Pressure Injury Sacrum Medial Deep Tissue Pressure Injury - Purple or maroon localized area of discolored intact skin or blood-filled blister due to damage of underlying soft tissue  from pressure and/or shear. (Active)     Wound 08/11/24 0415 Pressure Injury Vertebral column Medial Stage 2 -  Partial thickness loss of dermis presenting as a shallow open injury with a red, pink wound bed without slough. (Active)    Consultants:  Neurology, palliative care  Procedures:  As above  Antimicrobials:  Anti-infectives (From admission, onward)    Start     Dose/Rate Route Frequency Ordered Stop   08/16/24  1100  cefTRIAXone  (ROCEPHIN ) 2 g in sodium chloride  0.9 % 100 mL IVPB        2 g 200 mL/hr over 30 Minutes Intravenous Every 24 hours 08/16/24 1001 08/20/24 1214   07/23/24 0600  vancomycin  (VANCOCIN ) IVPB 1000 mg/200 mL premix  Status:  Discontinued        1,000 mg 200 mL/hr over 60 Minutes Intravenous Every 12 hours 07/22/24 1834 07/24/24 1808   07/22/24 2200  meropenem  (MERREM ) 2 g in sodium chloride  0.9 % 100 mL IVPB  Status:  Discontinued        2 g 280 mL/hr over 30 Minutes Intravenous Every 8 hours 07/22/24 1601 07/24/24 1808   07/22/24 1700  vancomycin  (VANCOREADY) IVPB 1500 mg/300 mL        1,500 mg 150 mL/hr over 120 Minutes Intravenous  Once 07/22/24 1601 07/22/24 2031   07/22/24 1645  ampicillin  (OMNIPEN) 2 g in sodium chloride  0.9 % 100 mL IVPB  Status:  Discontinued        2 g 300 mL/hr over 20 Minutes Intravenous Every 6 hours 07/22/24 1557 07/24/24 1800   07/19/24 0530  meropenem  (MERREM ) 1 g in sodium chloride  0.9 % 100 mL IVPB  Status:  Discontinued        1 g 200 mL/hr over 30 Minutes Intravenous Every 8 hours 07/19/24 0438 07/22/24 1601   07/16/24 0900  ceFEPIme  (MAXIPIME ) 2 g in sodium chloride  0.9 % 100 mL IVPB  Status:  Discontinued        2 g 200 mL/hr over 30 Minutes Intravenous Every 8 hours 07/16/24 0811 07/19/24 0409   07/16/24 0815  ceFEPIme  (MAXIPIME ) 2 g in sodium chloride  0.9 % 100 mL IVPB  Status:  Discontinued        2 g 200 mL/hr over 30 Minutes Intravenous  Once 07/16/24 0803 07/16/24 0811   07/16/24 0330  cefTRIAXone  (ROCEPHIN ) 2 g in sodium chloride  0.9 % 100 mL IVPB        2 g 200 mL/hr over 30 Minutes Intravenous  Once 07/16/24 0315 07/16/24 0412         Subjective: Seen and examined.  No family members were present when I saw her.  Patient slightly more conversational today.  Able to answer questions but continues to keep the eyes closed.  Objective: Vitals:   08/27/24 1619 08/27/24 1629 08/27/24 2124 08/28/24 0733  BP: (!) 112/50 (!)  126/90 (!) 109/56 (!) 133/46  Pulse: 67 67 64 71  Resp: 13 12 16 18   Temp:  98.6 F (37 C) 98 F (36.7 C) 99 F (37.2 C)  TempSrc:  Axillary Oral Oral  SpO2: 100% 100% 100% 99%  Weight:      Height:        Intake/Output Summary (Last 24 hours) at 08/28/2024 0959 Last data filed at 08/28/2024 0815 Gross per 24 hour  Intake 865.75 ml  Output --  Net 865.75 ml   Filed Weights   08/25/24 1458 08/26/24 0500 08/27/24 0500  Weight: 89.9 kg 88.1  kg 87.2 kg    Examination:  General exam: Appears very lethargic but slightly more conversational today. Respiratory system: Clear to auscultation. Respiratory effort normal. Cardiovascular system: S1 & S2 heard, RRR. No JVD, murmurs, rubs, gallops or clicks. No pedal edema. Gastrointestinal system: Abdomen is nondistended, soft and nontender. No organomegaly or masses felt. Normal bowel sounds heard. Central nervous system: Lethargic.  Data Reviewed: I have personally reviewed following labs and imaging studies  CBC: Recent Labs  Lab 08/22/24 0500 08/22/24 1618 08/23/24 0350 08/25/24 0744 08/26/24 0540 08/27/24 1443  WBC 5.3  --  5.2 5.5 5.1 5.4  NEUTROABS 3.9  --  3.3 3.8  --   --   HGB 7.6* 8.0* 8.0* 9.1* 8.1* 8.8*  9.2*  HCT 24.0* 25.3* 25.3* 28.3* 26.1* 27.7*  27.0*  MCV 95.6  --  94.8 96.9 98.5 96.9  PLT 170  --  191 198 163 185   Basic Metabolic Panel: Recent Labs  Lab 08/22/24 0500 08/23/24 0350 08/25/24 0744 08/27/24 1443  NA 136 136 137 138  140  K 4.6 4.4 4.5 4.3  4.3  CL 106 104 103 102  98  CO2 27 28 27 29   GLUCOSE 358* 229* 179* 167*  167*  BUN 21 20 13 13  12   CREATININE 0.39* 0.35* 0.33* 0.31*  0.40*  CALCIUM  8.0* 8.1* 8.4* 8.9  MG 1.9 1.9  --   --   PHOS 1.9* 2.1*  --   --    GFR: Estimated Creatinine Clearance: 75.7 mL/min (A) (by C-G formula based on SCr of 0.31 mg/dL (L)). Liver Function Tests: Recent Labs  Lab 08/22/24 0500 08/23/24 0350  AST 33 41  ALT 64* 72*  ALKPHOS 69 83   BILITOT 0.3 0.3  PROT 4.2* 4.5*  ALBUMIN  3.4* 3.2*   No results for input(s): LIPASE, AMYLASE in the last 168 hours. No results for input(s): AMMONIA in the last 168 hours. Coagulation Profile: No results for input(s): INR, PROTIME in the last 168 hours.  Cardiac Enzymes: No results for input(s): CKTOTAL, CKMB, CKMBINDEX, TROPONINI in the last 168 hours. BNP (last 3 results) Recent Labs    05/22/24 0630  PROBNP 3,514.0*   HbA1C: No results for input(s): HGBA1C in the last 72 hours. CBG: Recent Labs  Lab 08/27/24 1707 08/27/24 1951 08/27/24 2334 08/28/24 0340 08/28/24 0759  GLUCAP 164* 137* 134* 125* 153*   Lipid Profile: No results for input(s): CHOL, HDL, LDLCALC, TRIG, CHOLHDL, LDLDIRECT in the last 72 hours. Thyroid  Function Tests: No results for input(s): TSH, T4TOTAL, FREET4, T3FREE, THYROIDAB in the last 72 hours. Anemia Panel: No results for input(s): VITAMINB12, FOLATE, FERRITIN, TIBC, IRON , RETICCTPCT in the last 72 hours. Sepsis Labs: No results for input(s): PROCALCITON, LATICACIDVEN in the last 168 hours.  No results found for this or any previous visit (from the past 240 hours).    Radiology Studies: No results found.  Scheduled Meds:  amLODipine   10 mg Per Tube Daily   atorvastatin   80 mg Per Tube QHS   Chlorhexidine  Gluconate Cloth  6 each Topical Daily   free water   75 mL Per Tube Q4H   gabapentin   100 mg Per Tube Q8H   insulin  aspart  0-9 Units Subcutaneous Q4H   insulin  glargine  15 Units Subcutaneous BID   levETIRAcetam   500 mg Intravenous Q12H   lidocaine   1 patch Transdermal Daily   nystatin   100,000 Units Topical TID   mouth rinse  15 mL Mouth Rinse  4 times per day   oxyCODONE   5 mg Per Tube Q6H   pantoprazole  (PROTONIX ) IV  40 mg Intravenous Q12H   polyethylene glycol  17 g Oral Daily   propranolol   20 mg Per Tube TID   sodium chloride  flush  10-40 mL Intracatheter Q12H    Continuous Infusions:  feeding supplement (GLUCERNA 1.2 CAL) 45 mL/hr at 08/27/24 1500     LOS: 42 days   Fredia Skeeter, MD [facility]  08/28/2024, 9:59 AM   *Please note that this is a verbal dictation therefore any spelling or grammatical errors are due to the Dragon [employer] One system interpretation.  Please page via Amion and do not message via secure chat for urgent patient care matters. Secure chat can be used for non urgent patient care matters.  How to contact the [facility] Attending or Consulting provider 7A - 7P or covering provider during after hours 7P -7A, for this patient?  Check the care team in [facility] and look for a) attending/consulting [facility] provider listed and b) the [facility] team listed. Page or secure chat 7A-7P. Log into [email or web address] and use [facility] universal password to access. If you do not have the password, please contact the hospital operator. Locate the [facility] provider you are looking for under [facility] and page to a number that you can be directly reached. If you still have difficulty reaching the provider, please page the [facility] (Director on Call) for the Hospitalists listed on amion for assistance.  "

## 2024-08-29 DIAGNOSIS — G9341 Metabolic encephalopathy: Secondary | ICD-10-CM | POA: Diagnosis not present

## 2024-08-29 LAB — CBC WITH DIFFERENTIAL/PLATELET
Abs Immature Granulocytes: 0.02 K/uL (ref 0.00–0.07)
Basophils Absolute: 0 K/uL (ref 0.0–0.1)
Basophils Relative: 0 %
Eosinophils Absolute: 0.2 K/uL (ref 0.0–0.5)
Eosinophils Relative: 3 %
HCT: 26.2 % — ABNORMAL LOW (ref 36.0–46.0)
Hemoglobin: 8.5 g/dL — ABNORMAL LOW (ref 12.0–15.0)
Immature Granulocytes: 0 %
Lymphocytes Relative: 19 %
Lymphs Abs: 1.3 K/uL (ref 0.7–4.0)
MCH: 30.9 pg (ref 26.0–34.0)
MCHC: 32.4 g/dL (ref 30.0–36.0)
MCV: 95.3 fL (ref 80.0–100.0)
Monocytes Absolute: 0.4 K/uL (ref 0.1–1.0)
Monocytes Relative: 6 %
Neutro Abs: 4.7 K/uL (ref 1.7–7.7)
Neutrophils Relative %: 72 %
Platelets: 177 K/uL (ref 150–400)
RBC: 2.75 MIL/uL — ABNORMAL LOW (ref 3.87–5.11)
RDW: 19.4 % — ABNORMAL HIGH (ref 11.5–15.5)
WBC: 6.5 K/uL (ref 4.0–10.5)
nRBC: 0 % (ref 0.0–0.2)

## 2024-08-29 LAB — GLUCOSE, CAPILLARY
Glucose-Capillary: 158 mg/dL — ABNORMAL HIGH (ref 70–99)
Glucose-Capillary: 187 mg/dL — ABNORMAL HIGH (ref 70–99)
Glucose-Capillary: 198 mg/dL — ABNORMAL HIGH (ref 70–99)
Glucose-Capillary: 153 mg/dL — ABNORMAL HIGH (ref 70–99)
Glucose-Capillary: 132 mg/dL — ABNORMAL HIGH (ref 70–99)
Glucose-Capillary: 101 mg/dL — ABNORMAL HIGH (ref 70–99)

## 2024-08-29 LAB — BASIC METABOLIC PANEL WITH GFR
Anion gap: 5 (ref 5–15)
BUN: 11 mg/dL (ref 8–23)
CO2: 31 mmol/L (ref 22–32)
Calcium: 8.8 mg/dL — ABNORMAL LOW (ref 8.9–10.3)
Chloride: 102 mmol/L (ref 98–111)
Creatinine, Ser: 0.31 mg/dL — ABNORMAL LOW (ref 0.44–1.00)
GFR, Estimated: >60 mL/min
Glucose, Bld: 127 mg/dL — ABNORMAL HIGH (ref 70–99)
Potassium: 4.2 mmol/L (ref 3.5–5.1)
Sodium: 138 mmol/L (ref 135–145)

## 2024-08-29 MED ORDER — CALCIUM GLUCONATE-NACL 2-0.675 GM/100ML-% IV SOLN
2.0000 g | Freq: Once | INTRAVENOUS | Status: AC
  Administered 2024-08-29: 2000 mg via INTRAVENOUS
  Filled 2024-08-29: qty 100

## 2024-08-29 MED ORDER — DIPHENHYDRAMINE HCL 25 MG PO CAPS
25.0000 mg | ORAL_CAPSULE | Freq: Four times a day (QID) | ORAL | Status: DC | PRN
  Administered 2024-08-29 – 2024-09-06 (×5): 25 mg via ORAL
  Filled 2024-08-29 (×2): qty 1

## 2024-08-29 MED ORDER — ENOXAPARIN SODIUM 40 MG/0.4ML IJ SOSY
40.0000 mg | PREFILLED_SYRINGE | INTRAMUSCULAR | Status: DC
  Administered 2024-08-29 – 2024-09-09 (×10): 40 mg via SUBCUTANEOUS
  Filled 2024-08-29 (×5): qty 0.4

## 2024-08-29 MED ORDER — SODIUM CHLORIDE 0.9 % IV SOLN
INTRAVENOUS | Status: AC
  Filled 2024-08-29 (×4): qty 200

## 2024-08-29 MED ORDER — CALCIUM GLUCONATE-NACL 2-0.675 GM/100ML-% IV SOLN
INTRAVENOUS | Status: AC
  Filled 2024-08-29: qty 100

## 2024-08-29 MED ORDER — CALCIUM GLUCONATE 10 % IV SOLN
2.0000 g | Freq: Once | INTRAVENOUS | Status: DC
  Filled 2024-08-29: qty 20

## 2024-08-29 MED ORDER — ACETAMINOPHEN 325 MG PO TABS
650.0000 mg | ORAL_TABLET | ORAL | Status: DC | PRN
  Administered 2024-08-29 – 2024-09-07 (×16): 650 mg via ORAL
  Filled 2024-08-29 (×4): qty 2

## 2024-08-29 MED ORDER — ACD FORMULA A 0.73-2.45-2.2 GM/100ML VI SOLN
1000.0000 mL | Status: DC
  Administered 2024-08-29: 1000 mL
  Filled 2024-08-29 (×2): qty 1000

## 2024-08-29 MED ORDER — LIDOCAINE 5 % EX PTCH
1.0000 | MEDICATED_PATCH | Freq: Every day | CUTANEOUS | Status: AC
  Administered 2024-08-29 – 2024-10-09 (×36): 1 via TRANSDERMAL
  Filled 2024-08-29 (×30): qty 1

## 2024-08-29 MED ORDER — HEPARIN SODIUM (PORCINE) 1000 UNIT/ML IJ SOLN
INTRAMUSCULAR | Status: AC
  Filled 2024-08-29: qty 5

## 2024-08-29 MED ORDER — HEPARIN SODIUM (PORCINE) 1000 UNIT/ML IJ SOLN
1000.0000 [IU] | Freq: Once | INTRAMUSCULAR | Status: DC
  Filled 2024-08-29: qty 1

## 2024-08-29 NOTE — Progress Notes (Signed)
 " PROGRESS NOTE    Brenda [name]  FMW:[id number] DOB: June 11, 1961 DOA: 07/15/2024 PCP: [facility]   Brief Narrative:  63 year old female with history of metastatic endometrial cancer, chronic cancer-related pain, hypertension, hyperlipidemia, diabetes mellitus, CVA, history of DVT and PE anticoagulated on Lovenox , OSA, chronic hypoxic respiratory failure on 2L via nasal cannula and recent hospitalization and discharged on 07/14/2024 for UTI treated with IV antibiotics followed by oral antibiotics on discharge presented with chills and weakness.  She was found to have UTI and was started on IV cefepime .  Unfortunately mental status worsened and she was transferred to [facility] for continuous EEG monitoring with 4 seizures noted on video EEG. She was on Keppra  11/16. Placed on empiric broad-spectrum antibiotics for possible meningitis/encephalitis which was ruled out based on CSF results. .    Neurology reconsulted 12/7 with development of rigidity.  Oncology has been reconsulted.  Family has begun to discuss palliative care but not ready to transition, palliative care at this point managing her cancer related pain and working on rigidity with neurology.  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 4 seizures noted 11/16. S/p LP,  meningitis ruled out Her husband notes worsening stiffness/rigidity/inability to move extremities starting about 1 week ago. EEG without epileptiform abnormalities. head CT without acute intracranial abnormality. MRI brain with evolving infarcts - MRI C spine without evidence of metastatic disease.  Remains on Keppra .  Neurology managing.  Per neurology GAD 65 antibody was negative, but it is only positive in about two thirds of patients with stiff person syndrome and is generally negative in patients with paraneoplastic stiff person syndrome. Anti-amphiphysin antibodies,can also play a role in stiff person syndrome, but have come back negative steroids have been tried with no improvement. appreciate neurology recommendations - ddx includes PD-1 inhibitor induced side effect, paraneoplatic syndrome, stiff person syndrome.  Considering catatonia.  We've stopped prozac  (low dose, lower suspicion for serotonin syndrome?).  S/p high dose steroids 12/9-11.  S/p trial sinimet.  S/p baclofen .  Follow paraneoplastic panel, GAD65 ab.  S/p PLEX #1 on 12/19 and #2 on 08/23/2024 and #3 on 08/25/2024 and 4th on 08/27/2024, per neurology, plan for total 5 days of treatment with the last 1 on Sunday or Monday.   Acute Metabolic Encephalopathy Intermittently encephalopathic. Delirium precautions    Anemia / Bright Red Blood Per Rectum BRBPR resolved at this time - CT GI bleed study without evidence GI bleed. IV PPI BID S/p 1 unit pRBC 12/1 - S/p IV iron . Normal folate, b12.  Normal iron , ferritin.  Anticoagulation on hold.  Hemoglobin fairly stable so I will resume VTE prophylaxis dose.   Klebsiella Pneumoniae UTI Treated with abx for UTI - s/p 5 day course Blood cultures NGx2   Metastatic Endometrial Cancer / Cancer Related Pain  Appreciate oncology  consult  CT chest/abdomen/pelvis for persistent (worsening?) back pain (note centrally necrotic infiltrative mass in L psoas in 06/2024) CT 12/7 with R psoas  muscle mass like enlargement measuring 5.0 x 4.6 cm indeterminate for tumor vs hematoma  - L psoas/paraspinal mass decreased in size.   CT 12/16 with R psoas muscle hematoma (discussed with IR 12/19, who noted nothing to drain), L2 sclerotic changes with L paraspinal masslike tissue at L2 concerning for neoplastic process Appreciate oncology needs - last saw 08/28/2024. Appreciate palliative care for adjusting the pain medications.   Elevated LFT's Monitor - relatively mild, will trend for now   Dysphagia Cortrak + tube feeds - eventually decision regarding PEG will need to be made, but will hold off for now with our trial of plex   Depression Anxiety Prozac  discontinued with rigidity   Pulmonary Embolism / Splenic Infarct: Anticoagulation was held due to psoas hematoma and GI bleed.  However hemoglobin has remained stable for several days so I will now resume VTE prophylaxis Lovenox .  If no side effects for next 2 to 3 days, may consider bumping up the dose to therapeutic.   CAD  Hx Stroke No major issue at the moment.  Abnormal Thyroid  Function Tests Repeat TSH and free T4 wnl Negative TSI On propanolol    T2DM Currently on Lantus  15 units twice daily and SSI, blood sugar fairly controlled.  Goals of Care Previous hospitalist had a conversation with them about comfort focused care and hospice, but at this point, we're trying to work up and treat the acute issues below (with regards to her rigidity and encephalopathy).  At this point in time, husband and the family are hoping to trial PLEX.  Will do our best to keep her as comfortable as possible as we pursue this treatment option with neurology assistance.  Per palliative, husband asking about transfer if she's not improved with this but not sure if he says that when he is emotional or not.  Will address once 5 days of Plex is completed.  Appreciate palliative care following this patient.  DVT prophylaxis: Place and maintain  sequential compression device Start: 08/02/24 0738   Code Status: Limited: Do not attempt resuscitation (DNR) -DNR-LIMITED -Do Not Intubate/DNI   Family Communication: None at bedside.  Status is: Inpatient Remains inpatient appropriate because: Encephalopathic   Estimated body mass index is 35.16 kg/m as calculated from the following:   Height as of this encounter: 5' 2 (1.575 m).   Weight as of this encounter: 87.2 kg.  Wound 07/25/24 1100 Pressure Injury Sacrum Medial Deep Tissue Pressure Injury - Purple or maroon localized area of discolored intact skin or blood-filled blister due to damage of underlying soft tissue from pressure and/or shear. (Active)     Wound 08/11/24 0415 Pressure Injury Vertebral column Medial Stage 2 -  Partial thickness loss of dermis presenting as a shallow open injury with a red, pink wound bed without slough. (Active)   Nutritional Assessment: Body mass index is 35.16 kg/m.SABRA Seen by dietician.  I agree with the assessment and plan as outlined below: Nutrition Status: Nutrition Problem: Moderate Malnutrition Etiology: chronic illness (cancer) Signs/Symptoms: percent weight loss, mild muscle depletion, moderate muscle depletion (12% x 6 months) Percent weight loss: 12 % Interventions: Prostat, Tube feeding, MVI  . Skin Assessment: I have examined the patient's skin and I agree with the wound assessment as performed by the wound care RN as outlined below: Wound 07/25/24 1100 Pressure Injury Sacrum Medial Deep  Tissue Pressure Injury - Purple or maroon localized area of discolored intact skin or blood-filled blister due to damage of underlying soft tissue from pressure and/or shear. (Active)     Wound 08/11/24 0415 Pressure Injury Vertebral column Medial Stage 2 -  Partial thickness loss of dermis presenting as a shallow open injury with a red, pink wound bed without slough. (Active)    Consultants:  Neurology, palliative care  Procedures:  As  above  Antimicrobials:  Anti-infectives (From admission, onward)    Start     Dose/Rate Route Frequency Ordered Stop   08/16/24 1100  cefTRIAXone  (ROCEPHIN ) 2 g in sodium chloride  0.9 % 100 mL IVPB        2 g 200 mL/hr over 30 Minutes Intravenous Every 24 hours 08/16/24 1001 08/20/24 1214   07/23/24 0600  vancomycin  (VANCOCIN ) IVPB 1000 mg/200 mL premix  Status:  Discontinued        1,000 mg 200 mL/hr over 60 Minutes Intravenous Every 12 hours 07/22/24 1834 07/24/24 1808   07/22/24 2200  meropenem  (MERREM ) 2 g in sodium chloride  0.9 % 100 mL IVPB  Status:  Discontinued        2 g 280 mL/hr over 30 Minutes Intravenous Every 8 hours 07/22/24 1601 07/24/24 1808   07/22/24 1700  vancomycin  (VANCOREADY) IVPB 1500 mg/300 mL        1,500 mg 150 mL/hr over 120 Minutes Intravenous  Once 07/22/24 1601 07/22/24 2031   07/22/24 1645  ampicillin  (OMNIPEN) 2 g in sodium chloride  0.9 % 100 mL IVPB  Status:  Discontinued        2 g 300 mL/hr over 20 Minutes Intravenous Every 6 hours 07/22/24 1557 07/24/24 1800   07/19/24 0530  meropenem  (MERREM ) 1 g in sodium chloride  0.9 % 100 mL IVPB  Status:  Discontinued        1 g 200 mL/hr over 30 Minutes Intravenous Every 8 hours 07/19/24 0438 07/22/24 1601   07/16/24 0900  ceFEPIme  (MAXIPIME ) 2 g in sodium chloride  0.9 % 100 mL IVPB  Status:  Discontinued        2 g 200 mL/hr over 30 Minutes Intravenous Every 8 hours 07/16/24 0811 07/19/24 0409   07/16/24 0815  ceFEPIme  (MAXIPIME ) 2 g in sodium chloride  0.9 % 100 mL IVPB  Status:  Discontinued        2 g 200 mL/hr over 30 Minutes Intravenous  Once 07/16/24 0803 07/16/24 0811   07/16/24 0330  cefTRIAXone  (ROCEPHIN ) 2 g in sodium chloride  0.9 % 100 mL IVPB        2 g 200 mL/hr over 30 Minutes Intravenous  Once 07/16/24 0315 07/16/24 0412         Subjective: Seen and examined.  More lethargic than yesterday.  When I tried to wake her up her, she moans and says  it hurts baby.  Unable to open her  eyes.  Objective: Vitals:   08/28/24 1609 08/28/24 2014 08/28/24 2345 08/29/24 0412  BP: 133/60 (!) 119/46 (!) 115/58 (!) 103/55  Pulse: 69 68 68 63  Resp: 18 19 17 17   Temp: 98.7 F (37.1 C) 100.1 F (37.8 C) 99 F (37.2 C) 98.5 F (36.9 C)  TempSrc: Oral Oral Oral Oral  SpO2: 100% 98% 100% 100%  Weight:      Height:        Intake/Output Summary (Last 24 hours) at 08/29/2024 0756 Last data filed at 08/29/2024 0412 Gross per 24 hour  Intake 375  ml  Output 2100 ml  Net -1725 ml   Filed Weights   08/25/24 1458 08/26/24 0500 08/27/24 0500  Weight: 89.9 kg 88.1 kg 87.2 kg    Examination:  General exam: Appears very lethargic, slightly more than yesterday. Respiratory system: Clear to auscultation. Respiratory effort normal. Cardiovascular system: S1 & S2 heard, RRR. No JVD, murmurs, rubs, gallops or clicks. No pedal edema. Gastrointestinal system: Abdomen is nondistended, soft and nontender. No organomegaly or masses felt. Normal bowel sounds heard. Central nervous system: Lethargic.  Data Reviewed: I have personally reviewed following labs and imaging studies  CBC: Recent Labs  Lab 08/23/24 0350 08/25/24 0744 08/26/24 0540 08/27/24 1443 08/29/24 0137  WBC 5.2 5.5 5.1 5.4 6.5  NEUTROABS 3.3 3.8  --   --  4.7  HGB 8.0* 9.1* 8.1* 8.8*  9.2* 8.5*  HCT 25.3* 28.3* 26.1* 27.7*  27.0* 26.2*  MCV 94.8 96.9 98.5 96.9 95.3  PLT 191 198 163 185 177   Basic Metabolic Panel: Recent Labs  Lab 08/23/24 0350 08/25/24 0744 08/27/24 1443 08/29/24 0137  NA 136 137 138  140 138  K 4.4 4.5 4.3  4.3 4.2  CL 104 103 102  98 102  CO2 28 27 29 31   GLUCOSE 229* 179* 167*  167* 127*  BUN 20 13 13  12 11   CREATININE 0.35* 0.33* 0.31*  0.40* 0.31*  CALCIUM  8.1* 8.4* 8.9 8.8*  MG 1.9  --   --   --   PHOS 2.1*  --   --   --    GFR: Estimated Creatinine Clearance: 75.7 mL/min (A) (by C-G formula based on SCr of 0.31 mg/dL (L)). Liver Function Tests: Recent Labs  Lab  08/23/24 0350  AST 41  ALT 72*  ALKPHOS 83  BILITOT 0.3  PROT 4.5*  ALBUMIN  3.2*   No results for input(s): LIPASE, AMYLASE in the last 168 hours. No results for input(s): AMMONIA in the last 168 hours. Coagulation Profile: No results for input(s): INR, PROTIME in the last 168 hours.  Cardiac Enzymes: No results for input(s): CKTOTAL, CKMB, CKMBINDEX, TROPONINI in the last 168 hours. BNP (last 3 results) Recent Labs    05/22/24 0630  PROBNP 3,514.0*   HbA1C: No results for input(s): HGBA1C in the last 72 hours. CBG: Recent Labs  Lab 08/28/24 1131 08/28/24 1557 08/28/24 2015 08/28/24 2346 08/29/24 0413  GLUCAP 174* 164* 153* 139* 158*   Lipid Profile: No results for input(s): CHOL, HDL, LDLCALC, TRIG, CHOLHDL, LDLDIRECT in the last 72 hours. Thyroid  Function Tests: No results for input(s): TSH, T4TOTAL, FREET4, T3FREE, THYROIDAB in the last 72 hours. Anemia Panel: No results for input(s): VITAMINB12, FOLATE, FERRITIN, TIBC, IRON , RETICCTPCT in the last 72 hours. Sepsis Labs: No results for input(s): PROCALCITON, LATICACIDVEN in the last 168 hours.  No results found for this or any previous visit (from the past 240 hours).    Radiology Studies: No results found.  Scheduled Meds:  amLODipine   10 mg Per Tube Daily   atorvastatin   80 mg Per Tube QHS   Chlorhexidine  Gluconate Cloth  6 each Topical Daily   free water   75 mL Per Tube Q4H   gabapentin   100 mg Per Tube Q8H   insulin  aspart  0-9 Units Subcutaneous Q4H   insulin  glargine  15 Units Subcutaneous BID   levETIRAcetam   500 mg Intravenous Q12H   lidocaine   1 patch Transdermal Daily   nystatin   100,000 Units Topical TID   mouth rinse  15 mL Mouth Rinse 4 times per day   oxyCODONE   5 mg Per Tube Q6H   pantoprazole  (PROTONIX ) IV  40 mg Intravenous Q12H   polyethylene glycol  17 g Oral Daily   propranolol   20 mg Per Tube TID   sodium chloride  flush   10-40 mL Intracatheter Q12H   Continuous Infusions:  feeding supplement (GLUCERNA 1.5 CAL) 1,000 mL (08/28/24 1315)     LOS: 43 days   Fredia Skeeter, MD [facility]  08/29/2024, 7:56 AM   *Please note that this is a verbal dictation therefore any spelling or grammatical errors are due to the Dragon [employer] One system interpretation.  Please page via Amion and do not message via secure chat for urgent patient care matters. Secure chat can be used for non urgent patient care matters.  How to contact the [facility] Attending or Consulting provider 7A - 7P or covering provider during after hours 7P -7A, for this patient?  Check the care team in [facility] and look for a) attending/consulting [facility] provider listed and b) the [facility] team listed. Page or secure chat 7A-7P. Log into [email or web address] and use [facility] universal password to access. If you do not have the password, please contact the hospital operator. Locate the [facility] provider you are looking for under [facility] and page to a number that you can be directly reached. If you still have difficulty reaching the provider, please page the [facility] (Director on Call) for the Hospitalists listed on amion for assistance.  "

## 2024-08-29 NOTE — Progress Notes (Signed)
 TPE tx # 5 is successfully completed---pt remained asymptomatic throughout---please see TPE flowsheet for any further values

## 2024-08-30 DIAGNOSIS — G9341 Metabolic encephalopathy: Secondary | ICD-10-CM | POA: Diagnosis not present

## 2024-08-30 DIAGNOSIS — R410 Disorientation, unspecified: Secondary | ICD-10-CM | POA: Diagnosis not present

## 2024-08-30 DIAGNOSIS — T45AX4A Poisoning by immune checkpoint inhibitors and immunostimulant drugs, undetermined, initial encounter: Secondary | ICD-10-CM | POA: Diagnosis not present

## 2024-08-30 DIAGNOSIS — N390 Urinary tract infection, site not specified: Secondary | ICD-10-CM | POA: Diagnosis not present

## 2024-08-30 DIAGNOSIS — G2582 Stiff-man syndrome: Secondary | ICD-10-CM | POA: Diagnosis not present

## 2024-08-30 LAB — LACTIC ACID, PLASMA: Lactic Acid, Venous: 0.7 mmol/L (ref 0.5–1.9)

## 2024-08-30 LAB — COMPREHENSIVE METABOLIC PANEL WITH GFR
ALT: 25 U/L (ref 0–44)
AST: 19 U/L (ref 15–41)
Albumin: 3.4 g/dL — ABNORMAL LOW (ref 3.5–5.0)
Alkaline Phosphatase: 46 U/L (ref 38–126)
Anion gap: 7 (ref 5–15)
BUN: 14 mg/dL (ref 8–23)
CO2: 27 mmol/L (ref 22–32)
Calcium: 8.5 mg/dL — ABNORMAL LOW (ref 8.9–10.3)
Chloride: 105 mmol/L (ref 98–111)
Creatinine, Ser: 0.34 mg/dL — ABNORMAL LOW (ref 0.44–1.00)
GFR, Estimated: >60 mL/min
Glucose, Bld: 169 mg/dL — ABNORMAL HIGH (ref 70–99)
Potassium: 4.1 mmol/L (ref 3.5–5.1)
Sodium: 138 mmol/L (ref 135–145)
Total Bilirubin: 0.4 mg/dL (ref 0.0–1.2)
Total Protein: 4.4 g/dL — ABNORMAL LOW (ref 6.5–8.1)

## 2024-08-30 LAB — CBC
HCT: 23.4 % — ABNORMAL LOW (ref 36.0–46.0)
Hemoglobin: 7.5 g/dL — ABNORMAL LOW (ref 12.0–15.0)
MCH: 31.3 pg (ref 26.0–34.0)
MCHC: 32.1 g/dL (ref 30.0–36.0)
MCV: 97.5 fL (ref 80.0–100.0)
Platelets: 154 K/uL (ref 150–400)
RBC: 2.4 MIL/uL — ABNORMAL LOW (ref 3.87–5.11)
RDW: 18.8 % — ABNORMAL HIGH (ref 11.5–15.5)
WBC: 4.9 K/uL (ref 4.0–10.5)
nRBC: 0 % (ref 0.0–0.2)

## 2024-08-30 LAB — GLUCOSE, CAPILLARY
Glucose-Capillary: 105 mg/dL — ABNORMAL HIGH (ref 70–99)
Glucose-Capillary: 166 mg/dL — ABNORMAL HIGH (ref 70–99)
Glucose-Capillary: 156 mg/dL — ABNORMAL HIGH (ref 70–99)
Glucose-Capillary: 163 mg/dL — ABNORMAL HIGH (ref 70–99)
Glucose-Capillary: 195 mg/dL — ABNORMAL HIGH (ref 70–99)

## 2024-08-30 LAB — MAGNESIUM: Magnesium: 1.6 mg/dL — ABNORMAL LOW (ref 1.7–2.4)

## 2024-08-30 MED ORDER — SODIUM CHLORIDE 0.9 % IV SOLN: INTRAVENOUS | Status: AC

## 2024-08-30 MED ORDER — MAGNESIUM SULFATE 2 GM/50ML IV SOLN
2.0000 g | Freq: Once | INTRAVENOUS | Status: AC
  Administered 2024-08-30: 2 g via INTRAVENOUS
  Filled 2024-08-30: qty 50

## 2024-08-30 MED ORDER — LACTATED RINGERS IV BOLUS
1000.0000 mL | Freq: Once | INTRAVENOUS | Status: AC
  Administered 2024-08-30: 1000 mL via INTRAVENOUS

## 2024-08-30 NOTE — Progress Notes (Signed)
 " PROGRESS NOTE  Brenda [name]  DOB: [birth year]  PCP: [facility] FMW:[id number]  DOA: 07/15/2024  LOS: 44 days  Hospital Day: 47  Subjective: Patient was seen and examined this morning. Middle-aged Caucasian female. Responds to voice.  Talks with eyes closed.  Oriented to self and place.  Unable to follow motor command. Family not at bedside  Brief narrative: Brenda [name] is a 63 y.o. female with PMH significant for metastatic endometrial cancer, chronic cancer-related pain, DM2, HTN, HLD, obesity, OSA, CAD/stent, CHF, CVA, h/o DVT and PE anticoagulated on Lovenox , chronic hypoxic respiratory failure on 2L via nasal cannula. Recently hospitalized 11/9 -11/11 for UTI, treated with IV antibiotics and discharged on oral antibiotics. Next day 11/12, patient presented to [facility] ED with chills and weakness.   She was found to have UTI and was started on IV cefepime .   Unfortunately mental status worsened  11/15, patient was noted to have a stiffening of upper extremities, she was transferred to [facility] for continuous EEG monitoring. 11/16, she was noted to have seizures and video EEG.  Started on Keppra . Meningitis/encephalitis was suspected and patient was started on empiric blissful antibiotics.  CSF panel unremarkable and hence antibiotics were stopped  12/7, because of development of rigidity, neurology and oncology were reconsulted.  Palliative care consulted as well for cancer-related pain   Assessment and plan: Seizures Workup as above.  Currently on Keppra .  Generalized rigidity Her husband noted worsening stiffness/rigidity/inability to move extremities starting about 1 week ago.  EEG without epileptiform abnormalities.  CT head without acute intracranial abnormality.  MRI brain with evolving infarcts -  MRI C spine without evidence of metastatic disease.  Per neurology GAD 65 antibody was negative, but it is only positive in about two  thirds of patients with stiff person syndrome and is generally negative in patients with paraneoplastic stiff person syndrome. Anti-amphiphysin antibodies,can also play a role in stiff person syndrome, but have come back negative steroids have been tried with no improvement. appreciate neurology recommendations - ddx includes PD-1 inhibitor induced side effect, paraneoplatic syndrome, stiff person syndrome.  Considering catatonia.  We've stopped prozac  (low dose, lower suspicion for serotonin syndrome?).  S/p high dose steroids 12/9-11.  S/p trial sinimet.  S/p baclofen .  Follow paraneoplastic panel, GAD65 ab.  S/p PLEX #1 on 12/19 and #2 on 08/23/2024 and #3 on 08/25/2024 and 4th on 08/27/2024, per neurology, plan for total 5 days of treatment with the last 1 on Sunday or Monday.   Acute Metabolic Encephalopathy Intermittently encephalopathic. Delirium precautions    Anemia / Bright Red Blood Per Rectum BRBPR resolved at this time - CT GI bleed study without evidence GI bleed. Continue IV PPI BID S/p 1 unit pRBC 12/1 - S/p IV iron . Normal folate, b12.  Normal iron , ferritin.   Anticoagulation on hold.   Hemoglobin fairly stable so I will resume VTE prophylaxis dose.   Klebsiella Pneumoniae UTI Treated with abx for UTI - s/p 5 day course Blood cultures NGx2   Metastatic Endometrial Cancer / Cancer Related Pain  Appreciate oncology consult  CT chest/abdomen/pelvis for persistent (worsening?) back pain (note centrally necrotic infiltrative mass in L psoas in 06/2024) CT 12/7 with R psoas muscle mass like enlargement measuring 5.0 x 4.6 cm indeterminate for tumor vs hematoma  - L psoas/paraspinal mass decreased in size.   CT 12/16 with R psoas muscle hematoma (discussed with IR 12/19, who noted nothing to drain), L2 sclerotic changes with  L paraspinal masslike tissue at L2 concerning for neoplastic process Appreciate oncology needs - last saw 08/28/2024. Appreciate palliative care for adjusting  the pain medications.   Elevated LFT's Monitor - relatively mild, will trend for now   Dysphagia Cortrak + tube feeds - eventually decision regarding PEG will need to be made, but will hold off for now with our trial of plex   Depression Anxiety Prozac  discontinued with rigidity   Pulmonary Embolism / Splenic Infarct: Anticoagulation was held due to psoas hematoma and GI bleed.  However hemoglobin has remained stable for several days so I will now resume VTE prophylaxis Lovenox .  If no side effects for next 2 to 3 days, may consider bumping up the dose to therapeutic.   CAD  Hx Stroke No major issue at the moment.   Abnormal Thyroid  Function Tests Repeat TSH and free T4 wnl Negative TSI On propanolol    T2DM Currently on Lantus  15 units twice daily and SSI, blood sugar fairly controlled.   Goals of Care Previous hospitalist had a conversation with them about comfort focused care and hospice, but at this point, we're trying to work up and treat the acute issues below (with regards to her rigidity and encephalopathy).  At this point in time, husband and the family are hoping to trial PLEX.  Will do our best to keep her as comfortable as possible as we pursue this treatment option with neurology assistance.  Per palliative, husband asking about transfer if she's not improved with this but not sure if he says that when he is emotional or not.  Will address once 5 days of Plex is completed.  Appreciate palliative care following this patient.   Nutrition Status: Nutrition Problem: Moderate Malnutrition Etiology: chronic illness (cancer) Signs/Symptoms: percent weight loss, mild muscle depletion, moderate muscle depletion (12% x 6 months) Percent weight loss: 12 % Interventions: Prostat, Tube feeding, MVI   PT Orders:   PT Follow up Rec: Skilled Nursing-Short Term Rehab (<3 Hours/Day)08/11/2024 1524    Goals of care   Code Status: Limited: Do not attempt resuscitation (DNR)  -DNR-LIMITED -Do Not Intubate/DNI      DVT prophylaxis:  enoxaparin  (LOVENOX ) injection 40 mg Start: 08/29/24 0900 Place and maintain sequential compression device Start: 08/02/24 0738   Antimicrobials: None currently Fluid: Tube feeding Consultants: Neurology, palliative care Family Communication: None at bedside  Status: Inpatient Level of care:  Progressive   Patient is from: Home Needs to continue in-hospital care: Mental status remains altered Anticipated d/c to: Unclear at this time      Diet:  Diet Order             Diet regular Room service appropriate? Yes with Assist; Fluid consistency: Thin  Diet effective now                   Scheduled Meds:  amLODipine   10 mg Per Tube Daily   atorvastatin   80 mg Per Tube QHS   calcium  gluconate  2 g Intravenous Once   Chlorhexidine  Gluconate Cloth  6 each Topical Daily   enoxaparin  (LOVENOX ) injection  40 mg Subcutaneous Q24H   free water   75 mL Per Tube Q4H   gabapentin   100 mg Per Tube Q8H   heparin  sodium (porcine)  1,000 Units Intracatheter Once   insulin  aspart  0-9 Units Subcutaneous Q4H   insulin  glargine  15 Units Subcutaneous BID   levETIRAcetam   500 mg Intravenous Q12H   lidocaine   1 patch  Transdermal Daily   nystatin   100,000 Units Topical TID   mouth rinse  15 mL Mouth Rinse 4 times per day   oxyCODONE   5 mg Per Tube Q6H   pantoprazole  (PROTONIX ) IV  40 mg Intravenous Q12H   polyethylene glycol  17 g Oral Daily   propranolol   20 mg Per Tube TID   sodium chloride  flush  10-40 mL Intracatheter Q12H    PRN meds: acetaminophen , ALPRAZolam , artificial tears, diphenhydrAMINE , guaiFENesin , HYDROmorphone  (DILAUDID ) injection **OR** [DISCONTINUED]  HYDROmorphone  (DILAUDID ) injection, LORazepam , naLOXone  (NARCAN )  injection, ondansetron  **OR** ondansetron  (ZOFRAN ) IV, mouth rinse, oxyCODONE  **OR** oxyCODONE , prochlorperazine , senna-docusate, sodium chloride  flush   Infusions:   citrate dextrose       feeding supplement (GLUCERNA 1.5 CAL) 1,000 mL (08/29/24 1324)    Antimicrobials: Anti-infectives (From admission, onward)    Start     Dose/Rate Route Frequency Ordered Stop   08/16/24 1100  cefTRIAXone  (ROCEPHIN ) 2 g in sodium chloride  0.9 % 100 mL IVPB        2 g 200 mL/hr over 30 Minutes Intravenous Every 24 hours 08/16/24 1001 08/20/24 1214   07/23/24 0600  vancomycin  (VANCOCIN ) IVPB 1000 mg/200 mL premix  Status:  Discontinued        1,000 mg 200 mL/hr over 60 Minutes Intravenous Every 12 hours 07/22/24 1834 07/24/24 1808   07/22/24 2200  meropenem  (MERREM ) 2 g in sodium chloride  0.9 % 100 mL IVPB  Status:  Discontinued        2 g 280 mL/hr over 30 Minutes Intravenous Every 8 hours 07/22/24 1601 07/24/24 1808   07/22/24 1700  vancomycin  (VANCOREADY) IVPB 1500 mg/300 mL        1,500 mg 150 mL/hr over 120 Minutes Intravenous  Once 07/22/24 1601 07/22/24 2031   07/22/24 1645  ampicillin  (OMNIPEN) 2 g in sodium chloride  0.9 % 100 mL IVPB  Status:  Discontinued        2 g 300 mL/hr over 20 Minutes Intravenous Every 6 hours 07/22/24 1557 07/24/24 1800   07/19/24 0530  meropenem  (MERREM ) 1 g in sodium chloride  0.9 % 100 mL IVPB  Status:  Discontinued        1 g 200 mL/hr over 30 Minutes Intravenous Every 8 hours 07/19/24 0438 07/22/24 1601   07/16/24 0900  ceFEPIme  (MAXIPIME ) 2 g in sodium chloride  0.9 % 100 mL IVPB  Status:  Discontinued        2 g 200 mL/hr over 30 Minutes Intravenous Every 8 hours 07/16/24 0811 07/19/24 0409   07/16/24 0815  ceFEPIme  (MAXIPIME ) 2 g in sodium chloride  0.9 % 100 mL IVPB  Status:  Discontinued        2 g 200 mL/hr over 30 Minutes Intravenous  Once 07/16/24 0803 07/16/24 0811   07/16/24 0330  cefTRIAXone  (ROCEPHIN ) 2 g in sodium chloride  0.9 % 100 mL IVPB        2 g 200 mL/hr over 30 Minutes Intravenous  Once 07/16/24 0315 07/16/24 0412       Objective: Vitals:   08/30/24 0534 08/30/24 0810  BP: (!) 103/43 (!) 109/46  Pulse: 69 70  Resp: 14    Temp: 98 F (36.7 C)   SpO2: 100% 100%    Intake/Output Summary (Last 24 hours) at 08/30/2024 1454 Last data filed at 08/30/2024 1302 Gross per 24 hour  Intake 540 ml  Output 1500 ml  Net -960 ml   Filed Weights   08/26/24 0500 08/27/24 0500 08/29/24 0853  Weight:  88.1 kg 87.2 kg 92.1 kg   Weight change:  Body mass index is 37.13 kg/m.   Physical Exam: General exam: Pleasant, middle-aged female, looks older for age Skin: No rashes, lesions or ulcers. HEENT: Atraumatic, normocephalic, no obvious bleeding Lungs: Clear to auscultation bilaterally,  CVS: S1, S2, no murmur,   GI/Abd: Soft, nontender, nondistended, bowel sound present,   CNS: Oriented to self and place only.  Unable to follow motor commands Extremities: No pedal edema, no calf tenderness,   Data Review: I have personally reviewed the laboratory data and studies available.  F/u labs ordered Unresulted Labs (From admission, onward)     Start     Ordered   08/31/24 0500  Basic metabolic panel with GFR  Tomorrow morning,   R       Question:  Specimen collection method  Answer:  Unit=Unit collect   08/30/24 1454   08/31/24 0500  CBC with Differential/Platelet  Tomorrow morning,   R       Question:  Specimen collection method  Answer:  Unit=Unit collect   08/30/24 1454   08/30/24 0924  Paraneoplastic Ab  Once,   R       Question:  Specimen collection method  Answer:  Unit=Unit collect   08/30/24 0924   08/05/24 0500  CBC  (enoxaparin  (LOVENOX ))  Every 7 days,   R     Question:  Specimen collection method  Answer:  Unit=Unit collect   08/04/24 0735   Signed and Held  Basic metabolic panel  Once,   R       Comments: Prior to TPE   Question:  Specimen collection method  Answer:  Unit=Unit collect   Signed and Held   Signed and Held  Basic metabolic panel  ONCE - STAT,   R       Comments: Prior to TPE   Question:  Specimen collection method  Answer:  Unit=Unit collect   Signed and Held             Signed, Chapman Rota, MD [facility] 08/30/2024  "

## 2024-08-30 NOTE — Progress Notes (Addendum)
 NEUROLOGY CONSULT FOLLOW UP NOTE   Date of service: August 30, 2024 Patient Name: Brenda [name] MRN:  [id number] DOB:  1961/08/22  Interval Hx/subjective  Patient is seen in her room with no family at the bedside.  Her mental status appears improved today with intact social speech and ability to answer yes or no to questions and follow some simple commands  Vitals   Vitals:   08/29/24 1622 08/29/24 2300 08/30/24 0534 08/30/24 0810  BP: (!) 100/51 (!) 106/48 (!) 103/43 (!) 109/46  Pulse: (!) 59 65 69 70  Resp: 18 14 14    Temp: 97.8 F (36.6 C) 98.7 F (37.1 C) 98 F (36.7 C)   TempSrc: Oral Oral Oral   SpO2: 98% 100% 100% 100%  Weight:      Height:         Body mass index is 37.13 kg/m.  Physical Exam   General: Lying in bed with eyes closed, no acute distress, answering questions with intact social speech Constitutional: Skin is warm to forehead and scalp as well as diaphoretic. Dependent portions of limbs in contact with her covered mattress are also warm and diaphoretic.  Eyes: No scleral injection.  HENT: No OP obstrucion.  Head: Normocephalic.  Respiratory: Effort normal, non-labored breathing on supplemental O2 Skin: No edema to lower extremities.   Neurologic Examination   Mental Status: Patient appears to be more calm today in response to her name although she is unable to state her name and when asked where she is, she states she is in a store.  She will answer yes or no to questions, but it is unclear if she comprehends the questions.  Social speech with phrases such as good morning are intact.  She will intermittently follow simple commands. Cranial Nerves:  II: No blink to threat. Left pupil 5 mm, ovoid and minimally reactive. Right pupil 2 mm and reactive. III, IV, VI: Does not fixate upon or track examiner. Eyes are conjugate and near the midline VII: Face is symmetrical resting and speaking VIII: Hearing intact to voice. IX, X: Phonation is  without hoarseness or hypopohonia.  XII: Tongue protrusion midline Motor: Does not move upper extremities to command today but will wiggle toes to command.  Some pain on passive movement of left arm but not right arm Tone is increased in bilateral upper and lower extremities without asymmetry.  Sensation: Grossly intact to light touch Coordination: Unable to perform Gait- Unable to assess  Medications Current Medications[1]  Labs and Diagnostic Imaging   CBC:  Recent Labs  Lab 08/25/24 0744 08/26/24 0540 08/27/24 1443 08/29/24 0137  WBC 5.5   < > 5.4 6.5  NEUTROABS 3.8  --   --  4.7  HGB 9.1*   < > 8.8*  9.2* 8.5*  HCT 28.3*   < > 27.7*  27.0* 26.2*  MCV 96.9   < > 96.9 95.3  PLT 198   < > 185 177   < > = values in this interval not displayed.    Basic Metabolic Panel:  Lab Results  Component Value Date   NA 138 08/29/2024   K 4.2 08/29/2024   CO2 31 08/29/2024   GLUCOSE 127 (H) 08/29/2024   BUN 11 08/29/2024   CREATININE 0.31 (L) 08/29/2024   CALCIUM  8.8 (L) 08/29/2024   GFRNONAA >60 08/29/2024   GFRAA 94 12/16/2019   Lipid Panel:  Lab Results  Component Value Date   LDLCALC 28 05/30/2024   HgbA1c:  Lab Results  Component Value Date   HGBA1C 7.2 (H) 06/01/2024   Urine Drug Screen:     Component Value Date/Time   LABOPIA POSITIVE (A) 04/20/2024 1817   COCAINSCRNUR NONE DETECTED 04/20/2024 1817   LABBENZ NONE DETECTED 04/20/2024 1817   AMPHETMU NONE DETECTED 04/20/2024 1817   THCU NONE DETECTED 04/20/2024 1817   LABBARB NONE DETECTED 04/20/2024 1817    Alcohol Level     Component Value Date/Time   ETH <15 04/20/2024 1344   INR  Lab Results  Component Value Date   INR 1.0 08/18/2024   APTT  Lab Results  Component Value Date   APTT 67 (H) 06/21/2024     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 GAD 65 antibody negative  Serum and CSF paraneoplastic panels were signed out on Monday as pending,  but now are revealed by the lab not to have been sent out.    MRI Brain, 08/09/24 (Personally reviewed): Multifocally evolving infarcts in the anterior and posterior circulation including small infarcts in the bilateral frontoparietal white matter and a medium-sized infarct in the right occipital lobe with developing encephalomalacia and resolving restricted diffusion. No evidence of new/interval acute infarct. No evidence of metastatic disease.  Assessment  Claudean Leavelle is a 63 y.o. 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 Her weakness was preventing her from ambulating. She was started on cefepime . Neurology was initially consulted on 11/16 for worsening encephalopathy that started 3 days after admission, along with tremors in BL uppers. Initially the tremors were intermittent and would go away when she would sleep but then became present continuously while awake.  There was concern for cefepime  toxicity causing seizures, for which she was started on Keppra  and cefepime  was discontinued. She continued to have neurological worsening despite cessation of seizures as well as no additional new strokes on repeat imaging.  Cervical spine imaging was also unremarkable for acute process. Neurology was re-consulted for worsening limb stiffness. 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  - Serial examinations this week have been with slight improvement since PLEX was started, but exam yesterday revealed a florid delirium. She would not open eyes or interact other than to cry out in pain with attempts to assess  her motor function, and exclaimed with repetitive short phrases that corresponded mostly with what seemed to be internal stimuli in the setting of her delirium.  Today (Sunday), the patient appears more calm with intact social speech and ability to answer yes or no to questions and she intermittently follows simple commands.  - Her 5th and last plasma exchange was completed yesterday (Saturday, 08/29/24).  - Labs: - CSF on 11/21 was clear and colorless. white count was mildly elevated at 8 cells/microliter. CSF protein normal. CSF glucose was 102. No growth at 3 days. CSF PCR panel for meningitis/encephalitis pathogens was negative.  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 GAD 65 antibody negative - Serum and CSF paraneoplastic panels were signed out on Monday as pending, but now are revealed by the lab not to have been sent out.  - Impression: - Stiffness on her exam coupled with altered mental  status is of unclear etiology. 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 Her initial exams with waxy flexibility (catalepsy) and decreased but intermittent verbalization could have been consistent with catatonia, but the time course would be unsual and lack of response to Ativan  makes this much less likely. She now has limb stiffness more consistent with rigidity rather than catalepsy.  GAD 65 antibody was negative, but it is only positive in about two thirds of patients with stiff person syndrome and is generally negative in patients with paraneoplastic stiff person syndrome. Anti-amphiphysin antibodies,can also play a role in stiff person syndrome, but have also come back negative. - Steroids have been tried with no improvement and PLEX was started empirically in case this was an irAE (aka ICI-iE)  associated with Keytruda , or some other antibody-driven autoimmune process. Unfortunately, it is unclear whether she has had any significant benefit from the full course of plasma exchange treatments (last PLEX was on Saturday). - CSF labs are not consistent with a meningitis or infectious encephalitis - The strokes seen on her MRI scans do not explain her clinical presentation - Suspect that a hospital delirium +/- UTI play a part in her presentation given fluctuating exam and prolonged hospitalization, but is unlikely to be the only etiology   Recommendations    Severe pain may also be due to her metastatic cancer. May benefit from pain management consult.  Delirium precautions Neurology will continue to follow.  Oncology has seen the patient in consultation Given no improvement in her delirium and limb stiffness in response to steroids and then PLEX, we can consider a trial of IVIG provided that there are no contraindications from an Oncology standpoint. Will need Oncology to weigh in with a follow up consultation.  ______________________________________________________________________  Patient seen by NP and then by MD. Earle FORBES Everitt Clint Abbey , MSN, AGACNP-BC [facility] See Amion for schedule and pager information 08/30/2024 9:41 AM   Electronically signed: Dr. Gloria Lambertson     [1]  Current Facility-Administered Medications:    acetaminophen  (TYLENOL ) tablet 650 mg, 650 mg, Oral, Q4H PRN, Michaela Aisha SQUIBB, MD, 650 mg at 08/29/24 1714   ALPRAZolam  (XANAX ) tablet 0.25 mg, 0.25 mg, Per Tube, TID PRN, Wonda, Josseline P, PA-C, 0.25 mg at 08/29/24 2303   amLODipine  (NORVASC ) tablet 10 mg, 10 mg, Per Tube, Daily, Drusilla, Sabas RAMAN, MD, 10 mg at 08/28/24 1048   artificial tears (LACRILUBE) ophthalmic ointment, , Both Eyes, Q4H PRN, Tariq, Hassan, MD, Given at 08/11/24 2102   atorvastatin  (LIPITOR ) tablet 80 mg, 80 mg, Per Tube, QHS, Sigdel, Santosh, MD, 80 mg at 08/29/24  2303   calcium  gluconate inj 10% (1 g) URGENT USE ONLY!, 2 g, Intravenous, Once, Michaela Aisha SQUIBB, MD   Chlorhexidine  Gluconate Cloth 2 % PADS 6 each, 6 each, Topical, Daily, Chavez, Abigail, NP, 6 each at 08/29/24 1320   citrate dextrose  (ACD-A  anticoagulant) solution 1,000 mL, 1,000 mL, Other, Continuous, Michaela Aisha SQUIBB, MD, 1,000 mL at 08/29/24 1115   diphenhydrAMINE  (BENADRYL ) capsule 25 mg, 25 mg, Oral, Q6H PRN, Michaela Aisha SQUIBB, MD, 25 mg at 08/29/24 1148   enoxaparin  (LOVENOX ) injection 40 mg, 40 mg, Subcutaneous, Q24H, Pahwani, Ravi, MD, 40 mg at 08/29/24 0837   feeding supplement (GLUCERNA 1.5 CAL) liquid 1,000 mL, 1,000 mL, Per Tube, Continuous, Pahwani, Ravi, MD, Last Rate: 45 mL/hr at 08/29/24 1324, 1,000  mL at 08/29/24 1324   free water  75 mL, 75 mL, Per Tube, Q4H, Cosette Blackwater, MD, 75 mL at 08/30/24 0830   gabapentin  (NEURONTIN ) 250 MG/5ML solution 100 mg, 100 mg, Per Tube, Q8H, Perri DELENA Meliton Mickey., MD, 100 mg at 08/30/24 0534   guaiFENesin  (ROBITUSSIN) 100 MG/5ML liquid 15 mL, 15 mL, Per Tube, Q6H PRN, Drusilla Sabas RAMAN, MD, 15 mL at 08/25/24 9173   heparin  sodium (porcine) injection 1,000 Units, 1,000 Units, Intracatheter, Once, Michaela Aisha SQUIBB, MD   HYDROmorphone  (DILAUDID ) injection 0.5 mg, 0.5 mg, Intravenous, Q2H PRN, 0.5 mg at 08/30/24 0534 **OR** [DISCONTINUED] HYDROmorphone  (DILAUDID ) injection 1 mg, 1 mg, Intravenous, Q2H PRN, Perri DELENA Meliton Mickey., MD   insulin  aspart (novoLOG ) injection 0-9 Units, 0-9 Units, Subcutaneous, Q4H, Perri DELENA Meliton Mickey., MD, 2 Units at 08/30/24 0830   insulin  glargine (LANTUS ) injection 15 Units, 15 Units, Subcutaneous, BID, Pahwani, Ravi, MD, 15 Units at 08/29/24 2304   levETIRAcetam  (KEPPRA ) undiluted injection 500 mg, 500 mg, Intravenous, Q12H, Howerter, Justin B, DO, 500 mg at 08/29/24 2304   lidocaine  (LIDODERM ) 5 % 1 patch, 1 patch, Transdermal, Daily, Pahwani, Fredia, MD, 1 patch at 08/29/24 1400   LORazepam   (ATIVAN ) injection 1 mg, 1 mg, Intravenous, Q2H PRN, Cooper, Josseline P, PA-C, 1 mg at 08/29/24 2021   naloxone  (NARCAN ) injection 0.4 mg, 0.4 mg, Intravenous, PRN, Rathore, Vasundhra, MD   nystatin  (MYCOSTATIN /NYSTOP ) topical powder 1 Application, 100,000 Units, Topical, TID, Vernon Fredia, MD, 1 Application at 08/29/24 2306   ondansetron  (ZOFRAN ) tablet 4 mg, 4 mg, Per Tube, Q6H PRN **OR** ondansetron  (ZOFRAN ) injection 4 mg, 4 mg, Intravenous, Q6H PRN, Cosette Blackwater, MD   Oral care mouth rinse, 15 mL, Mouth Rinse, 4 times per day, Perri DELENA Meliton Mickey., MD, 15 mL at 08/30/24 9167   Oral care mouth rinse, 15 mL, Mouth Rinse, PRN, Perri DELENA Meliton Mickey., MD, 15 mL at 08/21/24 1145   oxyCODONE  (Oxy IR/ROXICODONE ) immediate release tablet 2.5 mg, 2.5 mg, Oral, Q4H PRN **OR** oxyCODONE  (Oxy IR/ROXICODONE ) immediate release tablet 5 mg, 5 mg, Oral, Q4H PRN, Perri DELENA Meliton Mickey., MD, 5 mg at 08/29/24 1714   oxyCODONE  (Oxy IR/ROXICODONE ) immediate release tablet 5 mg, 5 mg, Per Tube, Q6H, 5 mg at 08/30/24 0830 **AND** [DISCONTINUED] oxyCODONE  (Oxy IR/ROXICODONE ) immediate release tablet 2.5 mg, 2.5 mg, Oral, Q4H PRN, Perri DELENA Meliton Mickey., MD, 2.5 mg at 08/20/24 1122   pantoprazole  (PROTONIX ) injection 40 mg, 40 mg, Intravenous, Q12H, Perri DELENA Meliton Mickey., MD, 40 mg at 08/29/24 2304   polyethylene glycol (MIRALAX  / GLYCOLAX ) packet 17 g, 17 g, Oral, Daily, Pahwani, Fredia, MD, 17 g at 08/29/24 0840   prochlorperazine  (COMPAZINE ) injection 10 mg, 10 mg, Intravenous, Q6H PRN, Howerter, Justin B, DO   propranolol  (INDERAL ) tablet 20 mg, 20 mg, Per Tube, TID, Sigdel, Santosh, MD, 20 mg at 08/28/24 2217   senna-docusate (Senokot-S) tablet 1 tablet, 1 tablet, Oral, QHS PRN, Foust, Katy L, NP, 1 tablet at 08/28/24 2029   sodium chloride  flush (NS) 0.9 % injection 10-40 mL, 10-40 mL, Intracatheter, Q12H, Sigdel, Santosh, MD, 10 mL at 08/29/24 2304   sodium chloride  flush (NS) 0.9 % injection 10-40 mL, 10-40  mL, Intracatheter, PRN, Sigdel, Santosh, MD

## 2024-08-30 NOTE — Plan of Care (Signed)
 " Problem: Education: Goal: Ability to describe self-care measures that may prevent or decrease complications (Diabetes Survival Skills Education) will improve 08/30/2024 1934 by Waddell Colton BROCKS, RN Outcome: Not Progressing 08/30/2024 1934 by Waddell Colton BROCKS, RN Outcome: Not Progressing Goal: Individualized Educational Video(s) Outcome: Not Progressing   Problem: Coping: Goal: Ability to adjust to condition or change in health will improve Outcome: Not Progressing   Problem: Fluid Volume: Goal: Ability to maintain a balanced intake and output will improve Outcome: Not Progressing   Problem: Health Behavior/Discharge Planning: Goal: Ability to identify and utilize available resources and services will improve Outcome: Not Progressing Goal: Ability to manage health-related needs will improve Outcome: Not Progressing   Problem: Metabolic: Goal: Ability to maintain appropriate glucose levels will improve Outcome: Not Progressing   Problem: Nutritional: Goal: Maintenance of adequate nutrition will improve Outcome: Not Progressing Goal: Progress toward achieving an optimal weight will improve Outcome: Not Progressing   Problem: Skin Integrity: Goal: Risk for impaired skin integrity will decrease Outcome: Not Progressing   Problem: Tissue Perfusion: Goal: Adequacy of tissue perfusion will improve Outcome: Not Progressing   Problem: Education: Goal: Knowledge of General Education information will improve Description: Including pain rating scale, medication(s)/side effects and non-pharmacologic comfort measures Outcome: Not Progressing   Problem: Health Behavior/Discharge Planning: Goal: Ability to manage health-related needs will improve Outcome: Not Progressing   Problem: Clinical Measurements: Goal: Ability to maintain clinical measurements within normal limits will improve Outcome: Not Progressing Goal: Will remain free from infection Outcome: Not Progressing Goal:  Diagnostic test results will improve Outcome: Not Progressing Goal: Respiratory complications will improve Outcome: Not Progressing Goal: Cardiovascular complication will be avoided Outcome: Not Progressing   Problem: Activity: Goal: Risk for activity intolerance will decrease Outcome: Not Progressing   Problem: Nutrition: Goal: Adequate nutrition will be maintained Outcome: Not Progressing   Problem: Coping: Goal: Level of anxiety will decrease Outcome: Not Progressing   Problem: Elimination: Goal: Will not experience complications related to bowel motility Outcome: Not Progressing Goal: Will not experience complications related to urinary retention Outcome: Not Progressing   Problem: Pain Managment: Goal: General experience of comfort will improve and/or be controlled Outcome: Not Progressing   Problem: Safety: Goal: Ability to remain free from injury will improve Outcome: Not Progressing   Problem: Skin Integrity: Goal: Risk for impaired skin integrity will decrease Outcome: Not Progressing   Problem: Education: Goal: Knowledge of General Education information will improve Description: Including pain rating scale, medication(s)/side effects and non-pharmacologic comfort measures Outcome: Not Progressing   Problem: Health Behavior/Discharge Planning: Goal: Ability to manage health-related needs will improve Outcome: Not Progressing   Problem: Clinical Measurements: Goal: Ability to maintain clinical measurements within normal limits will improve Outcome: Not Progressing Goal: Will remain free from infection Outcome: Not Progressing Goal: Diagnostic test results will improve Outcome: Not Progressing Goal: Respiratory complications will improve Outcome: Not Progressing Goal: Cardiovascular complication will be avoided Outcome: Not Progressing   Problem: Activity: Goal: Risk for activity intolerance will decrease Outcome: Not Progressing   Problem:  Nutrition: Goal: Adequate nutrition will be maintained Outcome: Not Progressing   Problem: Coping: Goal: Level of anxiety will decrease Outcome: Not Progressing   Problem: Elimination: Goal: Will not experience complications related to bowel motility Outcome: Not Progressing Goal: Will not experience complications related to urinary retention Outcome: Not Progressing   Problem: Pain Managment: Goal: General experience of comfort will improve and/or be controlled Outcome: Not Progressing   Problem: Safety: Goal: Ability to remain free  from injury will improve Outcome: Not Progressing   Problem: Skin Integrity: Goal: Risk for impaired skin integrity will decrease Outcome: Not Progressing   "

## 2024-08-30 NOTE — Progress Notes (Signed)
 IV team received a consult to assess the Right internal jugular HDC that was placed 08/18/24;  pt holds her head to the right, and the dressing becomes saturated with saliva and secretions; insertion site is red, as is the skin near her neck,  and also extending up the right side of her face; dressing was changed with the help of the RN caring for the patient; am concerned about the risk of infection for this line as it is very difficult to keep it clean and dry;

## 2024-08-31 DIAGNOSIS — G9341 Metabolic encephalopathy: Secondary | ICD-10-CM | POA: Diagnosis not present

## 2024-08-31 DIAGNOSIS — T45AX5A Adverse effect of immune checkpoint inhibitors and immunostimulant drugs, initial encounter: Secondary | ICD-10-CM | POA: Diagnosis not present

## 2024-08-31 DIAGNOSIS — Z66 Do not resuscitate: Secondary | ICD-10-CM | POA: Diagnosis not present

## 2024-08-31 DIAGNOSIS — Z515 Encounter for palliative care: Secondary | ICD-10-CM | POA: Diagnosis not present

## 2024-08-31 DIAGNOSIS — R410 Disorientation, unspecified: Secondary | ICD-10-CM | POA: Diagnosis not present

## 2024-08-31 DIAGNOSIS — G2582 Stiff-man syndrome: Secondary | ICD-10-CM | POA: Diagnosis not present

## 2024-08-31 DIAGNOSIS — T45AX4A Poisoning by immune checkpoint inhibitors and immunostimulant drugs, undetermined, initial encounter: Secondary | ICD-10-CM | POA: Diagnosis not present

## 2024-08-31 DIAGNOSIS — C7982 Secondary malignant neoplasm of genital organs: Secondary | ICD-10-CM | POA: Diagnosis not present

## 2024-08-31 LAB — BASIC METABOLIC PANEL WITH GFR
Anion gap: 6 (ref 5–15)
BUN: 13 mg/dL (ref 8–23)
CO2: 26 mmol/L (ref 22–32)
Calcium: 8.3 mg/dL — ABNORMAL LOW (ref 8.9–10.3)
Chloride: 104 mmol/L (ref 98–111)
Creatinine, Ser: <0.3 mg/dL — ABNORMAL LOW (ref 0.44–1.00)
Glucose, Bld: 167 mg/dL — ABNORMAL HIGH (ref 70–99)
Potassium: 4 mmol/L (ref 3.5–5.1)
Sodium: 136 mmol/L (ref 135–145)

## 2024-08-31 LAB — GLUCOSE, CAPILLARY
Glucose-Capillary: 121 mg/dL — ABNORMAL HIGH (ref 70–99)
Glucose-Capillary: 126 mg/dL — ABNORMAL HIGH (ref 70–99)
Glucose-Capillary: 130 mg/dL — ABNORMAL HIGH (ref 70–99)
Glucose-Capillary: 145 mg/dL — ABNORMAL HIGH (ref 70–99)
Glucose-Capillary: 149 mg/dL — ABNORMAL HIGH (ref 70–99)
Glucose-Capillary: 173 mg/dL — ABNORMAL HIGH (ref 70–99)
Glucose-Capillary: 191 mg/dL — ABNORMAL HIGH (ref 70–99)

## 2024-08-31 LAB — CBC WITH DIFFERENTIAL/PLATELET
Abs Immature Granulocytes: 0.01 K/uL (ref 0.00–0.07)
Basophils Absolute: 0 K/uL (ref 0.0–0.1)
Basophils Relative: 0 %
Eosinophils Absolute: 0.2 K/uL (ref 0.0–0.5)
Eosinophils Relative: 5 %
HCT: 23.1 % — ABNORMAL LOW (ref 36.0–46.0)
Hemoglobin: 7.5 g/dL — ABNORMAL LOW (ref 12.0–15.0)
Immature Granulocytes: 0 %
Lymphocytes Relative: 21 %
Lymphs Abs: 0.9 K/uL (ref 0.7–4.0)
MCH: 31.3 pg (ref 26.0–34.0)
MCHC: 32.5 g/dL (ref 30.0–36.0)
MCV: 96.3 fL (ref 80.0–100.0)
Monocytes Absolute: 0.3 K/uL (ref 0.1–1.0)
Monocytes Relative: 7 %
Neutro Abs: 3 K/uL (ref 1.7–7.7)
Neutrophils Relative %: 67 %
Platelets: 152 K/uL (ref 150–400)
RBC: 2.4 MIL/uL — ABNORMAL LOW (ref 3.87–5.11)
RDW: 18.6 % — ABNORMAL HIGH (ref 11.5–15.5)
WBC: 4.4 K/uL (ref 4.0–10.5)
nRBC: 0 % (ref 0.0–0.2)

## 2024-08-31 NOTE — Progress Notes (Signed)
 " Daily Progress Note   Date: 08/31/2024   Patient Name: Brenda [name]  DOB: Jun 03, 1961  MRN: [id number]  Age / Sex: 63 y.o., female  Attending Physician: Arlice Reichert, MD Primary Care Physician: [facility] Admit Date: 07/15/2024 Length of Stay: 45 days  Reason for Follow-up: Establishing goals of care  Past Medical History:  Diagnosis Date   Anemia    Arthritis    back, hands, hips (02/22/2015)   CAD (coronary artery disease)    a. complex LAD/diagonal bifurcation PCI in 2010. b. STEMI 10/2019 s/p PTCA/DES x1 to mLAD overlapping the old stent, residual disease treated medicaly.   Cancer [facility])    uterine   CHF (congestive heart failure) (HCC)    Depression    Diabetes mellitus (HCC)    started when I was pregnant; not sure if it was type 1 or type 2    History of radiation therapy    Endometrium- HDR 01/22/22-03/07/22- Dr. Lynwood Nasuti   Hypercholesterolemia    Hypertension    Morbid obesity (HCC)    Myocardial infarction (HCC)    mild x 3   Neuromuscular disorder (HCC)    neuropathy feet   Sleep apnea    cpap/    Assessment & Plan:   HPI/Patient Profile:   63 y.o. 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   Palliative medicine consulted for goals of care conversation.  SUMMARY OF RECOMMENDATIONS Continue DNR-limited Continue to current management to improve mentation PMT will continue to follow for GOC Follow neurology recs for improvement in mentation and rigidity Plasma exchange completed without much improvement  Symptom Management:  Oxycodone  5 mg Q6 for pain Oxycodone  2.5-5 mg Q4 PRN for breakthrough pain Ativan  1 mg Q2 PRN for agitation Dilaudid  0.5 mg Q2 PRN for severe pain Xanax  0.25 mg TID PRN for anxiety  Code Status: DNR - Limited  (DNR/DNI)  Prognosis: Unable to determine  Discharge Planning: To Be Determined  Discussed with: [name] about patient's improved speech but continued overall poor prognosis and trajectory.   Subjective:   Subjective: Chart Reviewed. Updates received. Patient Assessed. Created space and opportunity for patient  and family to explore thoughts and feelings regarding current medical situation.  PRN Tylenol  650 mg x2 in 24 hours. PRN Xanax  0.25 mg x1 in 24 hours. PRN Dilaudid  0.5 mg IV x4 in 24 hours. PRN Ativan  1 mg x1 in 24 hours. PRN Oxycodone  5 mg x2 in 24 hours. PRN medications in addition to scheduled oxycodone  5 mg q6.   Per neurology note on 08/30/2024 by Merrianne MD, considering IVIG after no improvement in limg stiffness and continued delirium after no response to steroids, plasma exchange, or sinemet .  Today's Discussion:  Met with the patient today at the bedside without any visitors. Patient was initially screaming something unintelligible when entering the room. Patient much more conversant today compared to prior visit last week. She is not able to share her name but confirms her name is Brenda [name]. When asked where she is right now she responds that she is with God. I asked for clarification and she does expand on what she means. She endorses that she is in a church and denies being in a hospital. She shared that she was not doing well and that she was in pain. She requests to see her mom (who has  passed away a couple weeks ago) but unsure if patient is aware of the fact.   Review of Systems  Constitutional:  Positive for fatigue.  Gastrointestinal:  Positive for abdominal pain.    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     Comments: Somnolent but awake to voice.   HENT:     Head: Normocephalic and atraumatic.     Nose: Nose normal.     Comments: Cortrak in place.  Cardiovascular:     Rate and Rhythm: Normal rate.     Pulses: Normal pulses.  Pulmonary:     Effort: Pulmonary effort is normal.  Abdominal:     Palpations: Abdomen is soft.     Tenderness: There is abdominal tenderness.  Skin:    General: Skin is dry.  Neurological:     Mental Status: She is disoriented.  Psychiatric:     Comments: Anxious    Palliative Assessment/Data: 30% (tube feeds)   Existing Vynca/ACP Documentation: None  Thank you for allowing us  to participate in the care of Brenda [name] PMT will continue to support holistically.  I personally spent a total of 25 minutes in the care of the patient today including preparing to see the patient, getting/reviewing separately obtained history, performing a medically appropriate exam/evaluation, referring and communicating with other health care professionals, and documenting clinical information in the EHR.   Brenda [name] Brenda [name]  Palliative Medicine Team  Team Phone # [phone] (Nights/Weekends) 08/31/2024 3:39 PM  "

## 2024-08-31 NOTE — Plan of Care (Signed)
 Calls out begging GOD for help. Scheduled pain meds and PRN in use. Tube feeding infusing.    Problem: Education: Goal: Ability to describe self-care measures that may prevent or decrease complications (Diabetes Survival Skills Education) will improve Outcome: Progressing   Problem: Health Behavior/Discharge Planning: Goal: Ability to manage health-related needs will improve Outcome: Progressing   Problem: Metabolic: Goal: Ability to maintain appropriate glucose levels will improve Outcome: Progressing   Problem: Nutritional: Goal: Maintenance of adequate nutrition will improve Outcome: Progressing   Problem: Activity: Goal: Risk for activity intolerance will decrease Outcome: Progressing   Problem: Nutrition: Goal: Adequate nutrition will be maintained Outcome: Progressing   Problem: Pain Managment: Goal: General experience of comfort will improve and/or be controlled Outcome: Progressing   Problem: Safety: Goal: Ability to remain free from injury will improve Outcome: Progressing   Problem: Skin Integrity: Goal: Risk for impaired skin integrity will decrease Outcome: Progressing

## 2024-08-31 NOTE — Progress Notes (Signed)
 Inderal  20mg  and Norvasc  10 mg due at 10am. BP 106/51 HR 59. Per verbal order from Dr Dahal to hold for now.

## 2024-08-31 NOTE — Progress Notes (Signed)
 " PROGRESS NOTE  Brenda [name]  DOB: [birth year]  PCP: [facility] [place] FMW:[id number]  DOA: 07/15/2024  LOS: 45 days  Hospital Day: 48  Subjective: Patient was seen and examined this morning. Lying on bed.  Responds to verbal command with eyes closed.  Able to tell me her name.  Oriented to place only.  Unable to follow other commands.  No family at bedside.  Afebrile, blood pressure running in low 100s.  Per RN, she skipped Inderal  and Norvasc  this morning because blood pressure was low Labs with no remarkable change, including hemoglobin low at 7.5  Brief narrative: Brenda [name] is a 63 y.o. female with PMH significant for metastatic endometrial cancer, chronic cancer-related pain, DM2, HTN, HLD, obesity, OSA, CAD/stent, CHF, CVA, h/o DVT and PE anticoagulated on Lovenox , chronic hypoxic respiratory failure on 2L via nasal cannula. Recently hospitalized 11/9 -11/11 for UTI, treated with IV antibiotics and discharged on oral antibiotics. Next day 11/12, patient presented to [facility] ED with chills and weakness.   She was found to have UTI and was started on IV cefepime .   Unfortunately mental status worsened  11/15, patient was noted to have a stiffening of upper extremities, she was transferred to [facility] for continuous EEG monitoring. 11/16, she was noted to have seizures and video EEG.  Started on Keppra . Meningitis/encephalitis was suspected and patient was started on empiric blissful antibiotics.  CSF panel unremarkable and hence antibiotics were stopped  12/7, because of development of rigidity, neurology and oncology were reconsulted.  Palliative care consulted as well for cancer-related pain   Assessment and plan: Seizures Workup as above.  Currently on Keppra .  Generalized rigidity Her husband noted worsening stiffness/rigidity/inability to move extremities starting about 1 week ago.  EEG without epileptiform abnormalities.  CT head without  acute intracranial abnormality.  MRI brain with evolving infarcts -  MRI C spine without evidence of metastatic disease.  Per neurology GAD 65 antibody was negative, but it is only positive in about two thirds of patients with stiff person syndrome and is generally negative in patients with paraneoplastic stiff person syndrome. Anti-amphiphysin antibodies,can also play a role in stiff person syndrome, but have come back negative steroids have been tried with no improvement. appreciate neurology recommendations - ddx includes PD-1 inhibitor induced side effect, paraneoplatic syndrome, stiff person syndrome.  Considering catatonia.  We've stopped prozac  (low dose, lower suspicion for serotonin syndrome?).  S/p high dose steroids 12/9-11.  S/p trial sinimet.  S/p baclofen .  Follow paraneoplastic panel, GAD65 ab.  S/p 5 days of PLEX, completed on 12/28    Acute Metabolic Encephalopathy Intermittently encephalopathic. Only able to answer yes/no questions and oriented to place.  Unable to follow other commands for me Delirium precautions    Anemia / Bright Red Blood Per Rectum BRBPR resolved at this time - CT GI bleed study without evidence GI bleed. Continue IV PPI BID S/p 1 unit pRBC 12/1 - S/p IV iron . Normal folate, b12.  Normal iron , ferritin.   Anticoagulation on hold.   Hemoglobin fairly stable so I will resume VTE prophylaxis dose.   Klebsiella Pneumoniae UTI Treated with abx for UTI - s/p 5 day course Blood cultures NGx2   Metastatic Endometrial Cancer / Cancer Related Pain  Appreciate oncology consult  CT chest/abdomen/pelvis for persistent (worsening?) back pain (note centrally necrotic infiltrative mass in L psoas in 06/2024) CT 12/7 with R psoas muscle mass like enlargement measuring 5.0 x 4.6 cm indeterminate for  tumor vs hematoma  - L psoas/paraspinal mass decreased in size.   CT 12/16 with R psoas muscle hematoma (discussed with IR 12/19, who noted nothing to drain), L2  sclerotic changes with L paraspinal masslike tissue at L2 concerning for neoplastic process Appreciate oncology needs - last saw 08/28/2024. Appreciate palliative care for adjusting the pain medications.   Elevated LFT's Monitor - relatively mild, will trend for now   Dysphagia Cortrak + tube feeds - eventually decision regarding PEG will need to be made, but will hold off for now with our trial of plex   Depression Anxiety Prozac  discontinued with rigidity   Pulmonary Embolism / Splenic Infarct: Anticoagulation was held due to psoas hematoma and GI bleed.  However hemoglobin has remained stable for several days so I will now resume VTE prophylaxis Lovenox .  If no side effects for next 2 to 3 days, may consider bumping up the dose to therapeutic.   CAD  Hx Stroke No major issue at the moment.   Abnormal Thyroid  Function Tests Repeat TSH and free T4 wnl Negative TSI On propanolol    T2DM Currently on Lantus  15 units twice daily and SSI, blood sugar fairly controlled.   Goals of Care Previous hospitalist had a conversation with them about comfort focused care and hospice, but at this point, we're trying to work up and treat the acute issues below (with regards to her rigidity and encephalopathy).  At this point in time, husband and the family are hoping to trial PLEX.  Will do our best to keep her as comfortable as possible as we pursue this treatment option with neurology assistance.  Per palliative, husband asking about transfer if she's not improved with this but not sure if he says that when he is emotional or not.     Nutrition Status: Nutrition Problem: Moderate Malnutrition Etiology: chronic illness (cancer) Signs/Symptoms: percent weight loss, mild muscle depletion, moderate muscle depletion (12% x 6 months) Percent weight loss: 12 % Interventions: Prostat, Tube feeding, MVI   PT Orders:   PT Follow up Rec: Skilled Nursing-Short Term Rehab (<3 Hours/Day)08/11/2024 1524     Goals of care   Code Status: Limited: Do not attempt resuscitation (DNR) -DNR-LIMITED -Do Not Intubate/DNI      DVT prophylaxis:  enoxaparin  (LOVENOX ) injection 40 mg Start: 08/29/24 0900 Place and maintain sequential compression device Start: 08/02/24 0738   Antimicrobials: None currently Fluid: Tube feeding Consultants: Neurology, palliative care Family Communication: None at bedside  Status: Inpatient Level of care:  Progressive   Patient is from: Home Needs to continue in-hospital care: Mental status remains altered Anticipated d/c to: Unclear at this time      Diet:  Diet Order             Diet regular Room service appropriate? Yes with Assist; Fluid consistency: Thin  Diet effective now                   Scheduled Meds:  amLODipine   10 mg Per Tube Daily   atorvastatin   80 mg Per Tube QHS   calcium  gluconate  2 g Intravenous Once   Chlorhexidine  Gluconate Cloth  6 each Topical Daily   enoxaparin  (LOVENOX ) injection  40 mg Subcutaneous Q24H   free water   75 mL Per Tube Q4H   gabapentin   100 mg Per Tube Q8H   heparin  sodium (porcine)  1,000 Units Intracatheter Once   insulin  aspart  0-9 Units Subcutaneous Q4H   insulin  glargine  15 Units Subcutaneous BID   levETIRAcetam   500 mg Intravenous Q12H   lidocaine   1 patch Transdermal Daily   nystatin   100,000 Units Topical TID   mouth rinse  15 mL Mouth Rinse 4 times per day   oxyCODONE   5 mg Per Tube Q6H   pantoprazole  (PROTONIX ) IV  40 mg Intravenous Q12H   polyethylene glycol  17 g Oral Daily   propranolol   20 mg Per Tube TID   sodium chloride  flush  10-40 mL Intracatheter Q12H    PRN meds: acetaminophen , ALPRAZolam , artificial tears, diphenhydrAMINE , guaiFENesin , HYDROmorphone  (DILAUDID ) injection **OR** [DISCONTINUED]  HYDROmorphone  (DILAUDID ) injection, LORazepam , naLOXone  (NARCAN )  injection, ondansetron  **OR** ondansetron  (ZOFRAN ) IV, mouth rinse, oxyCODONE  **OR** oxyCODONE , prochlorperazine ,  senna-docusate, sodium chloride  flush   Infusions:   citrate dextrose      feeding supplement (GLUCERNA 1.5 CAL) 1,000 mL (08/30/24 1649)    Antimicrobials: Anti-infectives (From admission, onward)    Start     Dose/Rate Route Frequency Ordered Stop   08/16/24 1100  cefTRIAXone  (ROCEPHIN ) 2 g in sodium chloride  0.9 % 100 mL IVPB        2 g 200 mL/hr over 30 Minutes Intravenous Every 24 hours 08/16/24 1001 08/20/24 1214   07/23/24 0600  vancomycin  (VANCOCIN ) IVPB 1000 mg/200 mL premix  Status:  Discontinued        1,000 mg 200 mL/hr over 60 Minutes Intravenous Every 12 hours 07/22/24 1834 07/24/24 1808   07/22/24 2200  meropenem  (MERREM ) 2 g in sodium chloride  0.9 % 100 mL IVPB  Status:  Discontinued        2 g 280 mL/hr over 30 Minutes Intravenous Every 8 hours 07/22/24 1601 07/24/24 1808   07/22/24 1700  vancomycin  (VANCOREADY) IVPB 1500 mg/300 mL        1,500 mg 150 mL/hr over 120 Minutes Intravenous  Once 07/22/24 1601 07/22/24 2031   07/22/24 1645  ampicillin  (OMNIPEN) 2 g in sodium chloride  0.9 % 100 mL IVPB  Status:  Discontinued        2 g 300 mL/hr over 20 Minutes Intravenous Every 6 hours 07/22/24 1557 07/24/24 1800   07/19/24 0530  meropenem  (MERREM ) 1 g in sodium chloride  0.9 % 100 mL IVPB  Status:  Discontinued        1 g 200 mL/hr over 30 Minutes Intravenous Every 8 hours 07/19/24 0438 07/22/24 1601   07/16/24 0900  ceFEPIme  (MAXIPIME ) 2 g in sodium chloride  0.9 % 100 mL IVPB  Status:  Discontinued        2 g 200 mL/hr over 30 Minutes Intravenous Every 8 hours 07/16/24 0811 07/19/24 0409   07/16/24 0815  ceFEPIme  (MAXIPIME ) 2 g in sodium chloride  0.9 % 100 mL IVPB  Status:  Discontinued        2 g 200 mL/hr over 30 Minutes Intravenous  Once 07/16/24 0803 07/16/24 0811   07/16/24 0330  cefTRIAXone  (ROCEPHIN ) 2 g in sodium chloride  0.9 % 100 mL IVPB        2 g 200 mL/hr over 30 Minutes Intravenous  Once 07/16/24 0315 07/16/24 0412       Objective: Vitals:    08/31/24 0444 08/31/24 0832  BP: (!) 117/55 (!) 106/51  Pulse: 65 (!) 59  Resp: 18 16  Temp: (!) 97.5 F (36.4 C) 98.3 F (36.8 C)  SpO2: 100% 100%    Intake/Output Summary (Last 24 hours) at 08/31/2024 0943 Last data filed at 08/31/2024 0915 Gross per 24 hour  Intake 1059.62  ml  Output 1550 ml  Net -490.38 ml   Filed Weights   08/27/24 0500 08/29/24 0853 08/31/24 0500  Weight: 87.2 kg 92.1 kg 95.3 kg   Weight change: 3.175 kg Body mass index is 38.41 kg/m.   Physical Exam: General exam: Pleasant, middle-aged female, looks older for age Skin: No rashes, lesions or ulcers. HEENT: Atraumatic, normocephalic, no obvious bleeding Lungs: Clear to auscultation bilaterally,  CVS: S1, S2, no murmur,   GI/Abd: Soft, nontender, nondistended, bowel sound present,   CNS: Oriented to self and place only.  Unable to follow motor commands Extremities: No pedal edema, no calf tenderness,   Data Review: I have personally reviewed the laboratory data and studies available.  F/u labs ordered Unresulted Labs (From admission, onward)     Start     Ordered   08/30/24 0924  Paraneoplastic Ab  Once,   R       Question:  Specimen collection method  Answer:  Unit=Unit collect   08/30/24 0924   08/05/24 0500  CBC  (enoxaparin  (LOVENOX ))  Every 7 days,   R (with TIMED occurrences)     Question:  Specimen collection method  Answer:  Unit=Unit collect   08/04/24 0735   Signed and Held  Basic metabolic panel  Once,   R       Comments: Prior to TPE   Question:  Specimen collection method  Answer:  Unit=Unit collect   Signed and Held   Signed and Held  Basic metabolic panel  ONCE - STAT,   R       Comments: Prior to TPE   Question:  Specimen collection method  Answer:  Unit=Unit collect   Signed and Held            Signed, Chapman Rota, MD [facility] 08/31/2024  "

## 2024-08-31 NOTE — Progress Notes (Signed)
 Nutrition Follow-up  DOCUMENTATION CODES:  Non-severe (moderate) malnutrition in context of chronic illness  INTERVENTION:  Continue the following tube feeding via cortrak: Glucerna 1.5 at 45 ml/h (1080 ml per day) Free water : 75mL every 4 hours per MD Provides 1620 kcal, 89 gm protein, 820 ml free water  daily ( fee water  TF+flush) Continue current diet as ordered, encourage PO intake Monitor GOC discussions with PMT  NUTRITION DIAGNOSIS:  Moderate Malnutrition related to chronic illness (cancer) as evidenced by percent weight loss, mild muscle depletion, moderate muscle depletion (12% x 6 months). - remains applicable  GOAL:  Patient will meet greater than or equal to 90% of their needs - being addressed with tube feeds  MONITOR:  Weight trends, TF tolerance, I & O's, Labs, Diet advancement  REASON FOR ASSESSMENT:  Consult Enteral/tube feeding initiation and management  ASSESSMENT:  Pt with hx of HTN, HLD, CAD, CHF, hx CVA, uterine cancer s/p hysterectomy and adjuvant radiation therapy in 2023 with recurrance with mets in July 2025, hx MI x 3, and DM type 1 presented to [facility] ED with weakness after a recent admission from 11/9-11/11. Pt had seizures during admission and was transferred to [facility] for continuous EEG.  11/13 - presented to [facility] ED with chills and weakness after recent admission to [facility] 11/15 - transferred to [facility] 11/19 - cortrak placed (gastric) 12/2 - SLP BSE, NPO 12/4 - MBS, DYS1, thin liquids 12/9 - SLP BSE, regular diet to promote PO intake 12/16 - TF on hold for GI bleed, changed to glucerna 1.5 when able to restart 12/19 - first PLEX treatment (planned for 5) 12/27 - fifth (final) PLEX treatment  Pt continues with cortrak providing 100% of her nutrition. Completed PLEX therapy, Neurology notes it's unclear whether treatment helped. Note that they can consider a trial of IVIG if ok with oncology.     Weight gain noted,  nonpitting edema noted the the extremities and generalized. Unclear if weight is accurate but TF regimen is appropriate.   Have previously discussed with family and care team about the need to consider long-term enteral options. Care team opted to hold on discussion until after PLEX. Pt has had cortrak in placed since 11/19 - for pt comfort would likely benefit from a more longterm feeding to. Little to no progress has been made in PO intake.   Admit weight: 77.9 kg Current weight: 95.3 kg   12% weight loss noted in the last 6 months to admission (since cancer recurrence) which is severe for timeframe  Nutritionally Relevant Medications: Scheduled Meds:  atorvastatin   80 mg Per Tube QHS   free water   75 mL Per Tube Q4H   insulin  aspart  0-9 Units Subcutaneous Q4H   insulin  glargine  15 Units Subcutaneous BID   levETIRAcetam   500 mg Intravenous Q12H   pantoprazole  IV  40 mg Intravenous Q12H   polyethylene glycol  17 g Oral Daily   Continuous Infusions:  citrate dextrose      feeding supplement (GLUCERNA 1.5 CAL) 1,000 mL (08/30/24 1649)   PRN Meds: diphenhydrAMINE , ondansetron , prochlorperazine , senna-docusate  Labs Reviewed: CBG ranges from 121-195 mg/dL over the last 24 hours Creatinine <0.30 HgbA1c 7.2% (06/01/24)  NUTRITION - FOCUSED PHYSICAL EXAM: Flowsheet Row Most Recent Value  Orbital Region No depletion  Upper Arm Region No depletion  Thoracic and Lumbar Region No depletion  Buccal Region No depletion  Temple Region Mild depletion  Clavicle Bone Region Mild depletion  Clavicle and  Acromion Bone Region Mild depletion  Scapular Bone Region Mild depletion  Dorsal Hand No depletion  Patellar Region Moderate depletion  Anterior Thigh Region Moderate depletion  Posterior Calf Region Moderate depletion  Edema (RD Assessment) Moderate  [BLE, BUE]  Hair Reviewed  Eyes Reviewed  Mouth Reviewed  Skin Reviewed  Nails Reviewed    Diet Order:   Diet Order              Diet regular Room service appropriate? Yes with Assist; Fluid consistency: Thin  Diet effective now                   EDUCATION NEEDS:  Education needs have been addressed  Skin:  Skin Assessment: Reviewed RN Assessment DTI: - Sacrum Stage 2:  - medial vertebral column  Last BM:  12/28 - type 6  Height:  Ht Readings from Last 1 Encounters:  08/21/24 5' 2 (1.575 m)    Weight:  Wt Readings from Last 1 Encounters:  08/31/24 95.3 kg    Ideal Body Weight:  50 kg  BMI:  Body mass index is 38.41 kg/m.  Estimated Nutritional Needs:  Kcal:  1600-1800 kcal/d Protein:  75-90 g/d Fluid:  >/=1.6L/d    Vernell Lukes, RD, LDN, CNSC Registered Dietitian II Please reach out via secure chat

## 2024-08-31 NOTE — Progress Notes (Signed)
 IP PROGRESS NOTE  Subjective:   Brenda [name] completed a final plasma exchange treatment on 08/29/2024.  Her daughter was at the bedside when I saw her at approximately 6:30 AM.  Her daughter reports Brenda [name] has been more talkative.  The bedside RN reports she is following some commands.  She continues to have pain.  She complained of abdominal pain during the night.   Objective: Vital signs in last 24 hours: Blood pressure (!) 117/55, pulse 65, temperature (!) 97.5 F (36.4 C), temperature source Axillary, resp. rate 18, height 5' 2 (1.575 m), weight 210 lb (95.3 kg), last menstrual period 10/30/2014, SpO2 100%.  Intake/Output from previous day: 12/28 0701 - 12/29 0700 In: 1059.6 [I.V.:609.6; NG/GT:450] Out: 550 [Urine:550]  Physical Exam: Alert Neurologic: Speaks a few words, not speaking in sentences, follows some commands.  Moves extremities to command, weak bilateral grip strength Abdomen: Soft, no mass, nontender Portacath/PICC-without erythema  Lab Results: Recent Labs    08/30/24 2105 08/31/24 0650  WBC 4.9 4.4  HGB 7.5* 7.5*  HCT 23.4* 23.1*  PLT 154 152    BMET Recent Labs    08/29/24 0137 08/30/24 2105  NA 138 138  K 4.2 4.1  CL 102 105  CO2 31 27  GLUCOSE 127* 169*  BUN 11 14  CREATININE 0.31* 0.34*  CALCIUM  8.8* 8.5*     Medications: I have reviewed the patient's current medications.  Assessment/Plan:  Endometrial cancer Diagnosed in 2023, status post surgery and radiation Recurrent disease July 2025 with a left retroperitoneal mass, biopsy confirmed adenocarcinoma, ER positive, MSI-high, tumor mutation burden 24, PD-L1 75%, PIK 3CA mutated, PTEN mutated, HER2 1+, loss of MLH1 expression 04/03/2024 CT chest: Spiculated 9 x 12 mm right upper lobe and part solid 4 x 6 mm right upper lobe nodules Cycle 1 paclitaxel /carboplatin /pembrolizumab  05/01/2024 Cycle 2 paclitaxel /carboplatin /pembrolizumab  06/30/2024 06/19/2024 CT abdomen/pelvis: Right  lower lobe pulmonary embolism, splenic infarct, stable size of the left psoas mass with evidence of erosive change at the anterior aspect of L2, decreased size of left external iliac node 08/09/2024 CTs: New psoas muscle mass (tumor versus hematoma), left psoas/paraspinal mass has decreased in size, by my review the previously noted lung nodules have resolved  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  DVT 06/02/2024 4.  Pulmonary embolism noted on CT chest 06/19/2024 5.  Diabetes 6.  CHF/CAD 7.  Seizures, cefepime  toxicity?  November 2025 8.  Progressive generalized loss of motor function-related to CVAs?,  Paraneoplastic? 08/11/2024-Solu-Medrol  daily x 3 Therapy plasma exchange 08/21/2024, 08/23/2024, 08/25/2024, 08/27/2024, 08/29/2024 9.  Respiratory failure 10.  Pain secondary to #1 11.  Anemia-stable 12.  History of elevated liver enzymes, normal 08/30/2024  Brenda [name] has a history of endometrial cancer initially diagnosed in 2020.  She was diagnosed with recurrent disease involving a left retroperitoneal mass in July 2025.  She completed 2 treatments with paclitaxel /carboplatin /pembrolizumab .  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  She completed 2 cycles of systemic therapy for treatment of the uterine cancer.  I reviewed the 08/09/2024 restaging CT findings with Brenda [name] and her daughter.  The known malignancy at the left retroperitoneum has improved significantly.  The significance of the right psoas mass is unclear.  I suspect  this is a benign finding.      The etiology of the diffuse generalized weakness is unclear.  There is likely a component related to the embolic CVAs  and deconditioning, but the evolution of the loss of motor function over the past several weeks appears to be more severe than expected.  She has undergone an extensive neurologic evaluation over the past month.  Neurology is following her in the hospital..  Neurology feels her symptoms may be related to PD-1 inhibitor neurotoxicity, a paraneoplastic syndrome, or another inflammatory process.  She started high-dose Solu-Medrol  on 08/11/2024, given daily for 3 days.  Her neurologic status did not improve.  She began plasma exchange on 08/21/2024.  She completed treatment #5 on 08/29/2024.  Her neurologic status appears partially improved today.   The etiology of her pain is unclear based on the restaging CT.  The retroperitoneal mass is much smaller.  She could be having pain from the right psoas lesion or another etiology.  There is no clear evidence for progression of the metastatic carcinoma.  She has persistent anemia.  The anemia is multifactorial including components related to bleeding, phlebotomy, and chronic disease  Recommendations: Continue narcotic analgesics and anxiolytics as needed for agitation/anxiety and pain, wean as tolerated Evaluation/management of neurologic status per neurology Anticoagulation resumed with prophylactic dose Lovenox  08/31/2024 tube feedings, follow-up with speech pathology Systemic treatment for the uterine cancer will remain on hold Continue goals of care discussions with palliative care Please call oncology as needed, I discussed the case with her daughter today.  I am available to speak with her husband.  I will check on her periodically while in the hospital.            LOS: 45 days   Arley Hof, MD   08/31/2024, 7:04 AM

## 2024-08-31 NOTE — Progress Notes (Signed)
 NEUROLOGY CONSULT FOLLOW UP NOTE   Date of service: August 31, 2024 Patient Name: Brenda [name] MRN:  [id number] DOB:  1961/08/31  Interval Hx/subjective   She is significantly improved compared to when I saw her last.  She still has periods of confusion, and seems to wax and wane some.  Vitals   Vitals:   08/31/24 0911 08/31/24 1124 08/31/24 1526 08/31/24 1659  BP: (!) 106/51 (!) 122/58 (!) 109/49 (!) 109/49  Pulse: (!) 59 65 71 71  Resp:  18 18   Temp:  98.3 F (36.8 C) 98.7 F (37.1 C)   TempSrc:  Oral Oral   SpO2:  100% 99%   Weight:      Height:         Physical Exam    Neuro: Mental Status: Patient arouses to voice, she tells me it is 1924 and is unable to give me the month but she is able to identify her husband and interact much more fluently than she was during my previous exams prior to 12/21 ranial Nerves: II: Pupils are unequal, left is larger and less reactive, documented repeatedly in previous notes as chronic.  III,IV, VI: eyes are midline, but her head is turned to the right.  Motor: Tone is markedly improved compared to 12/21, I am able to move her arms passively without significant rigidity.  She has severe weakness but is able to wiggle toes bilaterally and able to squeeze hands bilaterally.     Medications  Current Facility-Administered Medications:    acetaminophen  (TYLENOL ) tablet 650 mg, 650 mg, Oral, Q4H PRN, Michaela Aisha SQUIBB, MD, 650 mg at 08/31/24 0500   ALPRAZolam  (XANAX ) tablet 0.25 mg, 0.25 mg, Per Tube, TID PRN, Wonda, Josseline P, PA-C, 0.25 mg at 08/31/24 0500   amLODipine  (NORVASC ) tablet 10 mg, 10 mg, Per Tube, Daily, Drusilla, Sabas RAMAN, MD, 10 mg at 08/30/24 9057   artificial tears (LACRILUBE) ophthalmic ointment, , Both Eyes, Q4H PRN, Tariq, Hassan, MD, Given at 08/11/24 2102   atorvastatin  (LIPITOR ) tablet 80 mg, 80 mg, Per Tube, QHS, Sigdel, Santosh, MD, 80 mg at 08/30/24 2251   calcium  gluconate inj 10% (1 g) URGENT  USE ONLY!, 2 g, Intravenous, Once, Michaela Aisha SQUIBB, MD   Chlorhexidine  Gluconate Cloth 2 % PADS 6 each, 6 each, Topical, Daily, Chavez, Abigail, NP, 6 each at 08/31/24 1030   citrate dextrose  (ACD-A  anticoagulant) solution 1,000 mL, 1,000 mL, Other, Continuous, Michaela Aisha SQUIBB, MD, 1,000 mL at 08/29/24 1115   diphenhydrAMINE  (BENADRYL ) capsule 25 mg, 25 mg, Oral, Q6H PRN, Michaela Aisha SQUIBB, MD, 25 mg at 08/31/24 0500   enoxaparin  (LOVENOX ) injection 40 mg, 40 mg, Subcutaneous, Q24H, Pahwani, Ravi, MD, 40 mg at 08/31/24 0911   feeding supplement (GLUCERNA 1.5 CAL) liquid 1,000 mL, 1,000 mL, Per Tube, Continuous, Pahwani, Ravi, MD, Last Rate: 45 mL/hr at 08/30/24 1649, 1,000 mL at 08/30/24 1649   free water  75 mL, 75 mL, Per Tube, Q4H, Cosette Blackwater, MD, 75 mL at 08/31/24 1625   gabapentin  (NEURONTIN ) 250 MG/5ML solution 100 mg, 100 mg, Per Tube, Q8H, Perri DELENA Meliton Mickey., MD, 100 mg at 08/31/24 1406   guaiFENesin  (ROBITUSSIN) 100 MG/5ML liquid 15 mL, 15 mL, Per Tube, Q6H PRN, Drusilla Sabas RAMAN, MD, 15 mL at 08/25/24 9173   heparin  sodium (porcine) injection 1,000 Units, 1,000 Units, Intracatheter, Once, Michaela Aisha SQUIBB, MD   HYDROmorphone  (DILAUDID ) injection 0.5 mg, 0.5 mg, Intravenous, Q2H PRN, 0.5 mg at 08/31/24 1656 **OR** [  DISCONTINUED] HYDROmorphone  (DILAUDID ) injection 1 mg, 1 mg, Intravenous, Q2H PRN, Perri DELENA Meliton Mickey., MD   insulin  aspart (novoLOG ) injection 0-9 Units, 0-9 Units, Subcutaneous, Q4H, Perri DELENA Meliton Mickey., MD, 1 Units at 08/31/24 1625   insulin  glargine (LANTUS ) injection 15 Units, 15 Units, Subcutaneous, BID, Vernon Ranks, MD, 15 Units at 08/31/24 0940   levETIRAcetam  (KEPPRA ) undiluted injection 500 mg, 500 mg, Intravenous, Q12H, Howerter, Justin B, DO, 500 mg at 08/31/24 0911   lidocaine  (LIDODERM ) 5 % 1 patch, 1 patch, Transdermal, Daily, Vernon Ranks, MD, 1 patch at 08/31/24 1806   LORazepam  (ATIVAN ) injection 1 mg, 1 mg, Intravenous, Q2H  PRN, Cooper, Josseline P, PA-C, 1 mg at 08/31/24 9957   naloxone  (NARCAN ) injection 0.4 mg, 0.4 mg, Intravenous, PRN, Alfornia Madison, MD   nystatin  (MYCOSTATIN /NYSTOP ) topical powder 1 Application, 100,000 Units, Topical, TID, Vernon Ranks, MD, 1 Application at 08/31/24 1626   ondansetron  (ZOFRAN ) tablet 4 mg, 4 mg, Per Tube, Q6H PRN **OR** ondansetron  (ZOFRAN ) injection 4 mg, 4 mg, Intravenous, Q6H PRN, Cosette Blackwater, MD   Oral care mouth rinse, 15 mL, Mouth Rinse, 4 times per day, Perri DELENA Meliton Mickey., MD, 15 mL at 08/31/24 1626   Oral care mouth rinse, 15 mL, Mouth Rinse, PRN, Perri DELENA Meliton Mickey., MD, 15 mL at 08/21/24 1145   oxyCODONE  (Oxy IR/ROXICODONE ) immediate release tablet 2.5 mg, 2.5 mg, Oral, Q4H PRN **OR** oxyCODONE  (Oxy IR/ROXICODONE ) immediate release tablet 5 mg, 5 mg, Oral, Q4H PRN, Perri DELENA Meliton Mickey., MD, 5 mg at 08/31/24 0459   oxyCODONE  (Oxy IR/ROXICODONE ) immediate release tablet 5 mg, 5 mg, Per Tube, Q6H, 5 mg at 08/31/24 1405 **AND** [DISCONTINUED] oxyCODONE  (Oxy IR/ROXICODONE ) immediate release tablet 2.5 mg, 2.5 mg, Oral, Q4H PRN, Perri DELENA Meliton Mickey., MD, 2.5 mg at 08/20/24 1122   pantoprazole  (PROTONIX ) injection 40 mg, 40 mg, Intravenous, Q12H, Perri DELENA Meliton Mickey., MD, 40 mg at 08/31/24 0911   polyethylene glycol (MIRALAX  / GLYCOLAX ) packet 17 g, 17 g, Oral, Daily, Pahwani, Ranks, MD, 17 g at 08/31/24 0911   prochlorperazine  (COMPAZINE ) injection 10 mg, 10 mg, Intravenous, Q6H PRN, Howerter, Justin B, DO, 10 mg at 08/31/24 0500   propranolol  (INDERAL ) tablet 20 mg, 20 mg, Per Tube, TID, Sigdel, Santosh, MD, 20 mg at 08/30/24 1752   senna-docusate (Senokot-S) tablet 1 tablet, 1 tablet, Oral, QHS PRN, Foust, Katy L, NP, 1 tablet at 08/28/24 2029   sodium chloride  flush (NS) 0.9 % injection 10-40 mL, 10-40 mL, Intracatheter, Q12H, Sigdel, Santosh, MD, 10 mL at 08/31/24 0926   sodium chloride  flush (NS) 0.9 % injection 10-40 mL, 10-40 mL, Intracatheter, PRN,  Mcarthur Pick, MD  Labs and Diagnostic Imaging   CBC:  Recent Labs  Lab 08/29/24 0137 08/30/24 2105 08/31/24 0650  WBC 6.5 4.9 4.4  NEUTROABS 4.7  --  3.0  HGB 8.5* 7.5* 7.5*  HCT 26.2* 23.4* 23.1*  MCV 95.3 97.5 96.3  PLT 177 154 152    Basic Metabolic Panel:  Lab Results  Component Value Date   NA 136 08/31/2024   K 4.0 08/31/2024   CO2 26 08/31/2024   GLUCOSE 167 (H) 08/31/2024   BUN 13 08/31/2024   CREATININE <0.30 (L) 08/31/2024   CALCIUM  8.3 (L) 08/31/2024   GFRNONAA NOT CALCULATED 08/31/2024   GFRAA 94 12/16/2019   Lipid Panel:  Lab Results  Component Value Date   LDLCALC 28 05/30/2024   HgbA1c:  Lab Results  Component Value Date   HGBA1C  7.2 (H) 06/01/2024   Urine Drug Screen:     Component Value Date/Time   LABOPIA POSITIVE (A) 04/20/2024 1817   COCAINSCRNUR NONE DETECTED 04/20/2024 1817   LABBENZ NONE DETECTED 04/20/2024 1817   AMPHETMU NONE DETECTED 04/20/2024 1817   THCU NONE DETECTED 04/20/2024 1817   LABBARB NONE DETECTED 04/20/2024 1817    Alcohol Level     Component Value Date/Time   ETH <15 04/20/2024 1344   INR  Lab Results  Component Value Date   INR 1.0 08/18/2024   APTT  Lab Results  Component Value Date   APTT 67 (H) 06/21/2024   Imaging personally reviewed MRI Brain(Personally reviewed): 1. Similar evolving infarcts in the anterior and posterior circulation. 2. No new infarcts, progressive mass effect or acute hemorrhage.  Assessment   Branna Cortina is a 63 y.o. 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  Cervical spine imaging also unremarkable for acute process.  Given the stiffness on her exam  coupled with altered mental status that was of unclear etiology, possibilities were considered including side effect/neurotoxicity from the PD-1 inhibitor pembrolizumab , which is being used to treat her cancer, other paraneoplastic syndrome including possibly cancer related stiff person syndrome. Her exam with waxy flexibility and decreased but intermittent verbalization could have been consistent with catatonia, but the time course would be unsual and lack of response to Ativan  made this much less likely.  At this point, I favor an autoimmune nature to her stiffness and some of her altered mental status.  I also think that her current mental status changes likely have delirium playing a significant role, including some side effects of her pain regimen.  We are getting to a point where pain control I think will have to be balanced with her mental status, and she may be getting close to needing to consider palliative care.  Steroids were tried with no improvement and PLEX was pursued in case of an autoimmune nature or side effect of medication, and she does appear to have responded significantly to this.  I do not think I would pursue further immunomodulation at the current time, but if this was related to her Keytruda , then she may have gradual continued improvement in her exam over time.  Unfortunately, I think pain control and her general debilitation will likely play a bigger role in her prognosis moving forward.  Recommendations  Continue discussions with palliative care, versus considering if there are any options with oncology if she were to continue to improve from a mental status standpoint. I discussed with her husband that I suspect that we are at a tipping point where getting adequate pain control will likely cause her to continue to have confusion, and it may be that we have to choose which is our focus.   ______________________________________________________________________  Aisha Seals, MD [facility]   If 7pm- 7am, please page neurology on call as listed in [facility]ION.

## 2024-09-01 DIAGNOSIS — G9341 Metabolic encephalopathy: Secondary | ICD-10-CM | POA: Diagnosis not present

## 2024-09-01 LAB — PARANEOPLASTIC AB
AGNA-1: NEGATIVE
Amphiphysin Antibody: NEGATIVE
Anti-Hu Ab: NEGATIVE
Anti-Ri Ab: NEGATIVE
Anti-Yo Ab: NEGATIVE
Antineruonal nuclear Ab Type 3: NEGATIVE
CASPR2 Antibody,Cell-based IFA: NEGATIVE
CRMP-5 IgG: NEGATIVE
Interpretation: NEGATIVE
LGI1 Antibody, Cell-based IFA: NEGATIVE
Purkinje Cell Cyto Ab Type 2: NEGATIVE
Purkinje Cell Cyto Ab Type Tr: NEGATIVE
VGCC Antibody: 4 pmol/L (ref 0.0–30.0)

## 2024-09-01 LAB — GLUCOSE, CAPILLARY
Glucose-Capillary: 115 mg/dL — ABNORMAL HIGH (ref 70–99)
Glucose-Capillary: 127 mg/dL — ABNORMAL HIGH (ref 70–99)
Glucose-Capillary: 138 mg/dL — ABNORMAL HIGH (ref 70–99)
Glucose-Capillary: 143 mg/dL — ABNORMAL HIGH (ref 70–99)
Glucose-Capillary: 152 mg/dL — ABNORMAL HIGH (ref 70–99)
Glucose-Capillary: 161 mg/dL — ABNORMAL HIGH (ref 70–99)

## 2024-09-01 MED ORDER — PROPRANOLOL HCL 10 MG PO TABS
10.0000 mg | ORAL_TABLET | Freq: Three times a day (TID) | ORAL | Status: DC
  Administered 2024-09-01 – 2024-09-02 (×3): 10 mg
  Filled 2024-09-01 (×5): qty 1

## 2024-09-01 MED ORDER — OXYCODONE HCL 5 MG PO TABS
5.0000 mg | ORAL_TABLET | Freq: Three times a day (TID) | ORAL | Status: DC
  Administered 2024-09-01 – 2024-09-05 (×12): 5 mg via ORAL
  Filled 2024-09-01 (×12): qty 1

## 2024-09-01 NOTE — TOC Progression Note (Signed)
 Transition of Care [facility]) - Progression Note    Patient Details  Name: [name] MRN: [id number] Date of Birth: [birth year]  [facility] of [facility] CM/SW Contact  Brenda [name], Brenda [name] Phone Number: 09/01/2024, 3:02 PM  Clinical Narrative:   CSW continuing to follow for disposition. Medical workup still ongoing, palliative care continuing to follow for goals of care. CSW to follow.    Expected Discharge Plan:  (TBD) Barriers to Discharge: Continued Medical Work up               Expected Discharge Plan and Services                                               Social Drivers of Health (SDOH) Interventions SDOH Screenings   Food Insecurity: No Food Insecurity (07/16/2024)  Recent Concern: Food Insecurity - Food Insecurity Present (04/20/2024)  Housing: Low Risk (07/16/2024)  Transportation Needs: No Transportation Needs (07/16/2024)  Utilities: Not At Risk (07/16/2024)  Depression (PHQ2-9): Low Risk (06/30/2024)  Recent Concern: Depression (PHQ2-9) - Medium Risk (05/07/2024)  Social Connections: Moderately Integrated (07/13/2024)  Tobacco Use: Medium Risk (07/16/2024)    Readmission Risk Interventions    07/14/2024    2:34 PM 05/04/2024   10:00 AM  Readmission Risk Prevention Plan  Transportation Screening Complete Complete  PCP or Specialist Appt within 3-5 Days  Complete  HRI or Home Care Consult  Complete  Social Work Consult for Recovery Care Planning/Counseling  Complete  Palliative Care Screening  Not Applicable  Medication Review Oceanographer) Complete Complete  PCP or Specialist appointment within 3-5 days of discharge Complete   HRI or Home Care Consult Complete   SW Recovery Care/Counseling Consult Complete   Palliative Care Screening Not Applicable   Skilled Nursing Facility Not Applicable

## 2024-09-01 NOTE — Plan of Care (Signed)

## 2024-09-01 NOTE — Plan of Care (Signed)
   Problem: Coping: Goal: Ability to adjust to condition or change in health will improve Outcome: Progressing   Problem: Fluid Volume: Goal: Ability to maintain a balanced intake and output will improve Outcome: Progressing   Problem: Health Behavior/Discharge Planning: Goal: Ability to identify and utilize available resources and services will improve Outcome: Progressing

## 2024-09-01 NOTE — Progress Notes (Addendum)
 " PROGRESS NOTE  Brenda [name]  DOB: [birth year]  PCP: [facility] FMW:[id number]  DOA: 07/15/2024  LOS: 46 days  Hospital Day: 49  Subjective: Patient was seen and examined this morning. Lying on bed.  Responds to her name.  Knows she is in the hospital.  Voice getting clearer and louder.  Still weak to follow any motor commands. Earlier with RN, patient reportedly stated I want to hurt myself. I don't want here anymore. But, I can't go anywhere . On questioning by me, patient responded that she is frustrated.  Denied any suicidal ideation. Afebrile, hemodynamically stable, breathing on room air No labs this morning, blood sugar level has been consistently less than 150. Family not at bedside today  Brief narrative: Brenda [name] is a 63 y.o. female with PMH significant for metastatic endometrial cancer, chronic cancer-related pain, DM2, HTN, HLD, obesity, OSA, CAD/stent, CHF, CVA, h/o DVT and PE anticoagulated on Lovenox , chronic hypoxic respiratory failure on 2L via nasal cannula. Recently hospitalized 11/9 -11/11 for UTI, treated with IV antibiotics and discharged on oral antibiotics. Next day 11/12, patient presented to [facility] ED with chills and weakness.   She was found to have UTI and was started on IV cefepime .   Unfortunately mental status worsened  11/15, patient was noted to have a stiffening of upper extremities, she was transferred to [facility] for continuous EEG monitoring. 11/16, she was noted to have seizures and video EEG.  Started on Keppra . Meningitis/encephalitis was suspected and patient was started on empiric blissful antibiotics.  CSF panel unremarkable and hence antibiotics were stopped  12/7, because of development of rigidity, neurology and oncology were reconsulted.  Palliative care consulted as well for cancer-related pain   Assessment and plan: Seizures Workup as above.  Currently on Keppra . Seizure precautions  Acute  metabolic encephalopathy  Generalized rigidity Patient had significant altered mental status and generalized rigidity. Multiple possible etiologies were considered.   Neurology and oncology consult appreciated.  Cefepime  was discontinued.  Prozac  was discontinued.  EEG did not show epileptiform abnormalities.  CT head without acute intracranial abnormality.  MRI brain showed evolving infarcts  MRI C spine did not show any evidence of metastatic disease.  Other possible etiologies considered were - neurotoxicity from Keytruda  which was being used to treat her cancer.  Keytruda  was stopped.  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 S/p high dose steroids 12/9-12/11.  S/p trial sinimet.  S/p baclofen .   S/p 5 days of PLEX, completed on 12/28  Per neurology note from 12/29, likely favor an autoimmune process.  Mental status also influenced probably by pain regimen as well as hospital induced delirium.   Probably had autoimmune nature to her stiffness and some of her mental status alteration. Overall, patient's mental status seems to be gradually improving. Continue delirium precautions  Pain regimen adjusted per palliative care as below   Metastatic Endometrial Cancer Cancer Related Pain  Seen by oncology and palliative care. She had had multiple imagings done during this hospitalization.  Per oncology note, there is no clear evidence of progression of the metastatic carcinoma. Keytruda  on hold as explained above. Pain regimen adjusted per palliative care as below Pain regimen --- Scheduled: Oxycodone  5 mg every 6 hours.  I would hold off scheduled oxycodone  today.  Discussed with palliative care --- PRN: IV Dilaudid , oxycodone , Tylenol     Recent DVT/PE,Splenic Infarct In October 2025, patient was diagnosed to have right leg  DVT, PE as well as a splenic infarct and she was started on anticoagulation.   This hospitalization,  anticoagulation was held due to psoas hematoma and GI bleed.   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   Patient had BRBPR during this hospitalization BRBPR resolved at this time CT GI bleed study did not show any evidence GI bleed. Patient was given 1 unit of PRBC transfusion on 12/1.  Also given IV Venofer  Hemoglobin currently between 7 and 8 for last few days.   Anticoagulation on hold. On DVT prophylaxis with Lovenox  subcu Recent Labs    03/29/24 1512 03/30/24 0531 04/21/24 1541 04/22/24 0316 07/19/24 0245 07/20/24 1120 08/03/24 0319 08/04/24 0555 08/26/24 0540 08/27/24 1443 08/29/24 0137 08/30/24 2105 08/31/24 0650  HGB  --    < >  --    < > 10.8*   < > 6.8*   < > 8.1* 8.8*  9.2* 8.5* 7.5* 7.5*  MCV  --    < >  --    < > 83.1   < > 86.2   < > 98.5 96.9 95.3 97.5 96.3  VITAMINB12 487  --  764  --  512  --   --   --   --   --   --   --   --   FOLATE 10.2  --   --   --  17.7  --   --   --   --   --   --   --   --   FERRITIN 157  --   --   --   --   --  100  --   --   --   --   --   --   TIBC 224*  --   --   --   --   --  231*  --   --   --   --   --   --   IRON  20*  --   --   --   --   --  29  --   --   --   --   --   --   RETICCTPCT 1.7  --   --   --   --   --   --   --   --   --   --   --   --    < > = values in this interval not displayed.   Klebsiella Pneumoniae UTI Completed 5-day course of antibiotics   Dysphagia Moderate malnutrition With altered mental status, patient is unable to take oral intake.  She has been receiving tube feeds through [employer] therapist and dietitian following.  eventually decision regarding PEG will need to be made   Type 2 diabetes mellitus A1c 7.2 on 06/01/2024 Currently on Lantus  15 units twice daily, insulin  aspart SSI every 4 hours  Continue to monitor Recent Labs  Lab 08/31/24 1952 08/31/24 2332 09/01/24 0335 09/01/24 0744 09/01/24 1133  GLUCAP 145* 191* 152*  127* 115*   Hypertension Blood pressure control on amlodipine  10 mg daily.  Continue to monitor  Depression/anxiety Prozac  discontinued due to rigidity   H/o CAD, Stroke HLD Currently on Lipitor  80 mg daily.  Anticoagulation on hold as discussed above  Abnormal Thyroid  Function Tests It seems early in the course of hospitalization, TSH level was low and free T4 level was elevated at  1.41 on 11/16 Patient was started on propranolol  20 mg 3 times daily.  Subsequent repeat TSH level normal at 0.9 on 12/7 Propranolol  has been intermittently held because of low blood pressure.  I will start to slow down the dose.  Reduced to 10 mg 3 times daily today. Recent Labs    04/21/24 1541 04/23/24 1330 04/27/24 1438 07/19/24 0245 07/22/24 0833 08/09/24 1221  TSH 1.692 2.314 3.990 0.230* 0.315* 1.556     Nutrition Status: Nutrition Problem: Moderate Malnutrition Etiology: chronic illness (cancer) Signs/Symptoms: percent weight loss, mild muscle depletion, moderate muscle depletion (12% x 6 months) Percent weight loss: 12 % Interventions: Prostat, Tube feeding, MVI   PT Orders:   PT Follow up Rec: Skilled Nursing-Short Term Rehab (<3 Hours/Day)08/11/2024 1524    Goals of care   Code Status: Limited: Do not attempt resuscitation (DNR) -DNR-LIMITED -Do Not Intubate/DNI   Goals of Care Palliative care following.   Some improvement in mental status seen with plasmapheresis.    DVT prophylaxis:  enoxaparin  (LOVENOX ) injection 40 mg Start: 08/29/24 0900 Place and maintain sequential compression device Start: 08/02/24 0738   Antimicrobials: None currently Fluid: Tube feeding Consultants: Neurology, palliative care Family Communication: None at bedside  Status: Inpatient Level of care:  Progressive   Patient is from: Home Needs to continue in-hospital care: Mental status gradually improving Anticipated d/c to: Unclear at this time      Diet:  Diet Order             Diet  regular Room service appropriate? Yes with Assist; Fluid consistency: Thin  Diet effective now                   Scheduled Meds:  amLODipine   10 mg Per Tube Daily   atorvastatin   80 mg Per Tube QHS   calcium  gluconate  2 g Intravenous Once   Chlorhexidine  Gluconate Cloth  6 each Topical Daily   enoxaparin  (LOVENOX ) injection  40 mg Subcutaneous Q24H   free water   75 mL Per Tube Q4H   gabapentin   100 mg Per Tube Q8H   heparin  sodium (porcine)  1,000 Units Intracatheter Once   insulin  aspart  0-9 Units Subcutaneous Q4H   insulin  glargine  15 Units Subcutaneous BID   levETIRAcetam   500 mg Intravenous Q12H   lidocaine   1 patch Transdermal Daily   nystatin   100,000 Units Topical TID   mouth rinse  15 mL Mouth Rinse 4 times per day   pantoprazole  (PROTONIX ) IV  40 mg Intravenous Q12H   polyethylene glycol  17 g Oral Daily   propranolol   10 mg Per Tube TID   sodium chloride  flush  10-40 mL Intracatheter Q12H    PRN meds: acetaminophen , ALPRAZolam , artificial tears, diphenhydrAMINE , guaiFENesin , HYDROmorphone  (DILAUDID ) injection **OR** [DISCONTINUED]  HYDROmorphone  (DILAUDID ) injection, LORazepam , naLOXone  (NARCAN )  injection, ondansetron  **OR** ondansetron  (ZOFRAN ) IV, mouth rinse, oxyCODONE  **OR** oxyCODONE , prochlorperazine , senna-docusate, sodium chloride  flush   Infusions:   citrate dextrose      feeding supplement (GLUCERNA 1.5 CAL) 1,000 mL (09/01/24 1213)    Antimicrobials: Anti-infectives (From admission, onward)    Start     Dose/Rate Route Frequency Ordered Stop   08/16/24 1100  cefTRIAXone  (ROCEPHIN ) 2 g in sodium chloride  0.9 % 100 mL IVPB        2 g 200 mL/hr over 30 Minutes Intravenous Every 24 hours 08/16/24 1001 08/20/24 1214   07/23/24 0600  vancomycin  (VANCOCIN ) IVPB 1000 mg/200 mL premix  Status:  Discontinued  1,000 mg 200 mL/hr over 60 Minutes Intravenous Every 12 hours 07/22/24 1834 07/24/24 1808   07/22/24 2200  meropenem  (MERREM ) 2 g in sodium  chloride 0.9 % 100 mL IVPB  Status:  Discontinued        2 g 280 mL/hr over 30 Minutes Intravenous Every 8 hours 07/22/24 1601 07/24/24 1808   07/22/24 1700  vancomycin  (VANCOREADY) IVPB 1500 mg/300 mL        1,500 mg 150 mL/hr over 120 Minutes Intravenous  Once 07/22/24 1601 07/22/24 2031   07/22/24 1645  ampicillin  (OMNIPEN) 2 g in sodium chloride  0.9 % 100 mL IVPB  Status:  Discontinued        2 g 300 mL/hr over 20 Minutes Intravenous Every 6 hours 07/22/24 1557 07/24/24 1800   07/19/24 0530  meropenem  (MERREM ) 1 g in sodium chloride  0.9 % 100 mL IVPB  Status:  Discontinued        1 g 200 mL/hr over 30 Minutes Intravenous Every 8 hours 07/19/24 0438 07/22/24 1601   07/16/24 0900  ceFEPIme  (MAXIPIME ) 2 g in sodium chloride  0.9 % 100 mL IVPB  Status:  Discontinued        2 g 200 mL/hr over 30 Minutes Intravenous Every 8 hours 07/16/24 0811 07/19/24 0409   07/16/24 0815  ceFEPIme  (MAXIPIME ) 2 g in sodium chloride  0.9 % 100 mL IVPB  Status:  Discontinued        2 g 200 mL/hr over 30 Minutes Intravenous  Once 07/16/24 0803 07/16/24 0811   07/16/24 0330  cefTRIAXone  (ROCEPHIN ) 2 g in sodium chloride  0.9 % 100 mL IVPB        2 g 200 mL/hr over 30 Minutes Intravenous  Once 07/16/24 0315 07/16/24 0412       Objective: Vitals:   09/01/24 0930 09/01/24 1151  BP: (!) 123/58 (!) 130/59  Pulse: 72 71  Resp:  16  Temp:  99.4 F (37.4 C)  SpO2:  100%    Intake/Output Summary (Last 24 hours) at 09/01/2024 1314 Last data filed at 09/01/2024 1153 Gross per 24 hour  Intake 470 ml  Output 725 ml  Net -255 ml   Filed Weights   08/27/24 0500 08/29/24 0853 08/31/24 0500  Weight: 87.2 kg 92.1 kg 95.3 kg   Weight change:  Body mass index is 38.41 kg/m.   Physical Exam: General exam: Pleasant, middle-aged female, looks older for age Skin: No rashes, lesions or ulcers. HEENT: Atraumatic, normocephalic, no obvious bleeding Lungs: Clear to auscultation bilaterally,  CVS: S1, S2, no  murmur,   GI/Abd: Soft, nontender, nondistended, bowel sound present,   CNS: Tries to open eyes, but mostly talks with eyes closed.  Oriented to self and place only.  Unable to follow motor commands Extremities: No pedal edema, no calf tenderness,   Data Review: I have personally reviewed the laboratory data and studies available.  F/u labs ordered Unresulted Labs (From admission, onward)     Start     Ordered   08/30/24 0924  Paraneoplastic Ab  Once,   R       Question:  Specimen collection method  Answer:  Unit=Unit collect   08/30/24 0924   08/05/24 0500  CBC  (enoxaparin  (LOVENOX ))  Every 7 days,   R (with TIMED occurrences)     Question:  Specimen collection method  Answer:  Unit=Unit collect   08/04/24 0735   Signed and Held  Basic metabolic panel  Once,   R  Comments: Prior to TPE   Question:  Specimen collection method  Answer:  Unit=Unit collect   Signed and Held   Signed and Held  Basic metabolic panel  ONCE - STAT,   R       Comments: Prior to TPE   Question:  Specimen collection method  Answer:  Unit=Unit collect   Signed and Held            Signed, Chapman Rota, MD [facility] 09/01/2024  "

## 2024-09-01 NOTE — Plan of Care (Signed)
 Pt. Is not progressing, still confused, and only follows to some commands.

## 2024-09-01 NOTE — Progress Notes (Signed)
 At bedside while giving medications, pt. Stated I want to hurt myself. I don't want here anymore. But, I can't go anywhere. This nurse asked patient to clarify and asked further questions, but patient stayed silent. Made sure side rails are up, bed is free of items that may cause harm to the patient or anything that is not necessary, kept doors open and alarm on. Family is not at bedside. Mentioned this event during progression with charge nurse and child psychotherapist. Informed Dr. Chapman.  Dr. Chapman responded that he is going to mention it to family during his rounds.

## 2024-09-02 DIAGNOSIS — T50905A Adverse effect of unspecified drugs, medicaments and biological substances, initial encounter: Secondary | ICD-10-CM

## 2024-09-02 DIAGNOSIS — C7982 Secondary malignant neoplasm of genital organs: Secondary | ICD-10-CM | POA: Diagnosis not present

## 2024-09-02 DIAGNOSIS — T45AX4A Poisoning by immune checkpoint inhibitors and immunostimulant drugs, undetermined, initial encounter: Secondary | ICD-10-CM | POA: Diagnosis not present

## 2024-09-02 DIAGNOSIS — R410 Disorientation, unspecified: Secondary | ICD-10-CM | POA: Diagnosis not present

## 2024-09-02 DIAGNOSIS — G2582 Stiff-man syndrome: Secondary | ICD-10-CM | POA: Diagnosis not present

## 2024-09-02 DIAGNOSIS — G9341 Metabolic encephalopathy: Secondary | ICD-10-CM | POA: Diagnosis not present

## 2024-09-02 DIAGNOSIS — T45AX5A Adverse effect of immune checkpoint inhibitors and immunostimulant drugs, initial encounter: Secondary | ICD-10-CM | POA: Diagnosis not present

## 2024-09-02 LAB — GLUCOSE, CAPILLARY
Glucose-Capillary: 157 mg/dL — ABNORMAL HIGH (ref 70–99)
Glucose-Capillary: 130 mg/dL — ABNORMAL HIGH (ref 70–99)
Glucose-Capillary: 151 mg/dL — ABNORMAL HIGH (ref 70–99)
Glucose-Capillary: 134 mg/dL — ABNORMAL HIGH (ref 70–99)
Glucose-Capillary: 116 mg/dL — ABNORMAL HIGH (ref 70–99)
Glucose-Capillary: 170 mg/dL — ABNORMAL HIGH (ref 70–99)

## 2024-09-02 LAB — CBC
HCT: 25.1 % — ABNORMAL LOW (ref 36.0–46.0)
Hemoglobin: 7.9 g/dL — ABNORMAL LOW (ref 12.0–15.0)
MCH: 30.7 pg (ref 26.0–34.0)
MCHC: 31.5 g/dL (ref 30.0–36.0)
MCV: 97.7 fL (ref 80.0–100.0)
Platelets: 210 K/uL (ref 150–400)
RBC: 2.57 MIL/uL — ABNORMAL LOW (ref 3.87–5.11)
RDW: 18.4 % — ABNORMAL HIGH (ref 11.5–15.5)
WBC: 4.4 K/uL (ref 4.0–10.5)
nRBC: 0 % (ref 0.0–0.2)

## 2024-09-02 MED ORDER — MIDODRINE HCL 5 MG PO TABS
5.0000 mg | ORAL_TABLET | Freq: Two times a day (BID) | ORAL | Status: DC
  Filled 2024-09-02: qty 1

## 2024-09-02 MED ORDER — LACTATED RINGERS IV BOLUS
500.0000 mL | INTRAVENOUS | Status: AC
  Administered 2024-09-02: 500 mL via INTRAVENOUS

## 2024-09-02 NOTE — Progress Notes (Signed)
 " PROGRESS NOTE  Brenda [name]  DOB: 1961-08-03  PCP: [facility] FMW:[id number]  DOA: 07/15/2024  LOS: 47 days  Hospital Day: 50  Subjective: Patient was seen and examined this morning. Lying on bed.  Responsive with her eyes closed. Unable to answer orientation questions. Says 'I am at [facility].' Noted she was yelling last night. Neurology signed off.  Palliative care to follow up. Afebrile, heart rate in 60s and 70s.  Blood pressure low normal range, breathing on room air. Labs this morning with WBC count 4.4, hemoglobin 7.9  Brief narrative: Brenda [name] is a 63 y.o. female with PMH significant for metastatic endometrial cancer, chronic cancer-related pain, DM2, HTN, HLD, obesity, OSA, CAD/stent, CHF, CVA, h/o DVT and PE anticoagulated on Lovenox , chronic hypoxic respiratory failure on 2L via nasal cannula. Recently hospitalized 11/9 -11/11 for UTI, treated with IV antibiotics and discharged on oral antibiotics. Next day 11/12, patient presented to [facility] ED with chills and weakness.   She was found to have UTI and was started on IV cefepime .   Unfortunately mental status worsened  11/15, patient was noted to have a stiffening of upper extremities, she was transferred to [facility] for continuous EEG monitoring. 11/16, she was noted to have seizures and video EEG.  Started on Keppra . Meningitis/encephalitis was suspected and patient was started on empiric blissful antibiotics.  CSF panel unremarkable and hence antibiotics were stopped  12/7, because of development of rigidity, neurology and oncology were reconsulted.  Palliative care consulted as well for cancer-related pain   Assessment and plan: Seizures Workup as above.  Currently on Keppra . Seizure precautions  Acute metabolic encephalopathy  Generalized rigidity Patient had significant altered mental status and generalized rigidity. Multiple possible etiologies were considered.    Neurology and oncology consult appreciated.  Cefepime  was discontinued.  Prozac  was discontinued.  EEG did not show epileptiform abnormalities.  CT head without acute intracranial abnormality.  MRI brain showed evolving infarcts  MRI C spine did not show any evidence of metastatic disease.  Other possible etiologies considered were - neurotoxicity from Keytruda  which was being used to treat her cancer.  Keytruda  was stopped.  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 S/p high dose steroids 12/9-12/11.  S/p trial sinimet.  S/p baclofen .   S/p 5 days of PLEX, completed on 12/28  Per neurology note from 12/29, likely favor an autoimmune process.  Mental status also influenced probably by pain regimen as well as hospital induced delirium.   Probably had autoimmune nature to her stiffness and some of her mental status alteration. Overall, patient's mental status seems to be gradually improving.  Neurology has signed off. Continue delirium precautions  Pain regimen adjusted per palliative care as below   Metastatic Endometrial Cancer Cancer Related Pain  Seen by oncology and palliative care. She had had multiple imagings done during this hospitalization.  Per oncology note, there is no clear evidence of progression of the metastatic carcinoma. Keytruda  on hold as explained above. Pain regimen adjusted per palliative care as below Pain regimen --- Scheduled: Oxycodone  5 mg every 8 hours.  I had stopped oxycodone  yesterday at 12/30 but patient had significant pain and hence I restarted that the same evening --- PRN: IV Dilaudid , oxycodone , Tylenol     Recent DVT/PE,Splenic Infarct In October 2025, patient was diagnosed to have right leg DVT, PE as well as a splenic infarct and she was started on anticoagulation.   This  hospitalization, anticoagulation was held due to psoas hematoma and GI bleed.   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   Patient had BRBPR during this hospitalization BRBPR resolved at this time CT GI bleed study did not show any evidence GI bleed. Patient was given 1 unit of PRBC transfusion on 12/1.  Also given IV Venofer  Hemoglobin currently between 7 and 8 for last few days.   Anticoagulation on hold. On DVT prophylaxis with Lovenox  subcu Recent Labs    03/29/24 1512 03/30/24 0531 04/21/24 1541 04/22/24 0316 07/19/24 0245 07/20/24 1120 08/03/24 0319 08/04/24 0555 08/27/24 1443 08/29/24 0137 08/30/24 2105 08/31/24 0650 09/02/24 0332  HGB  --    < >  --    < > 10.8*   < > 6.8*   < > 8.8*  9.2* 8.5* 7.5* 7.5* 7.9*  MCV  --    < >  --    < > 83.1   < > 86.2   < > 96.9 95.3 97.5 96.3 97.7  VITAMINB12 487  --  764  --  512  --   --   --   --   --   --   --   --   FOLATE 10.2  --   --   --  17.7  --   --   --   --   --   --   --   --   FERRITIN 157  --   --   --   --   --  100  --   --   --   --   --   --   TIBC 224*  --   --   --   --   --  231*  --   --   --   --   --   --   IRON  20*  --   --   --   --   --  29  --   --   --   --   --   --   RETICCTPCT 1.7  --   --   --   --   --   --   --   --   --   --   --   --    < > = values in this interval not displayed.   Klebsiella Pneumoniae UTI Completed 5-day course of antibiotics   Dysphagia Moderate malnutrition With altered mental status, patient is unable to take oral intake.  She has been receiving tube feeds through [employer] therapist and dietitian following.  eventually decision regarding PEG will need to be made   Type 2 diabetes mellitus A1c 7.2 on 06/01/2024 Currently on Lantus  15 units twice daily, insulin  aspart SSI every 4 hours  Continue to monitor Recent Labs  Lab 09/01/24 1947 09/01/24 2331 09/02/24 0409 09/02/24 0821 09/02/24 1207  GLUCAP 138* 161* 157* 130* 151*   Hypertension Blood pressure control on amlodipine  10 mg daily.   Continue to monitor  Depression/anxiety Prozac  discontinued due to rigidity   H/o CAD, Stroke HLD Currently on Lipitor  80 mg daily.  Anticoagulation on hold as discussed above  Abnormal Thyroid  Function Tests It seems early in the course of hospitalization, TSH level was low and free T4 level was elevated at 1.41 on 11/16 Patient was started on propranolol  20 mg 3 times daily.  Subsequent repeat TSH  level normal at 0.9 on 12/7 Propranolol  has been intermittently held because of low blood pressure.  I think propranolol  dose needs to be tapered off. 12/30. reduced to 10 mg 3 times daily. Recent Labs    04/21/24 1541 04/23/24 1330 04/27/24 1438 07/19/24 0245 07/22/24 0833 08/09/24 1221  TSH 1.692 2.314 3.990 0.230* 0.315* 1.556     Nutrition Status: Nutrition Problem: Moderate Malnutrition Etiology: chronic illness (cancer) Signs/Symptoms: percent weight loss, mild muscle depletion, moderate muscle depletion (12% x 6 months) Percent weight loss: 12 % Interventions: Prostat, Tube feeding, MVI   PT Orders:   PT Follow up Rec: Skilled Nursing-Short Term Rehab (<3 Hours/Day)08/11/2024 1524    Goals of care   Code Status: Limited: Do not attempt resuscitation (DNR) -DNR-LIMITED -Do Not Intubate/DNI   Goals of Care Neurology signed off..  paused scheduled oxycodone  yesterday but had to resume it last night because she was in pain. I wonder if we should have a comfort care conversation with family.  Honest I do not believe she will get any different promising treatment or outcome from transferring elsewhere. I am lost on how else to help her.  I have reached out to oncology and palliative care.   DVT prophylaxis:  enoxaparin  (LOVENOX ) injection 40 mg Start: 08/29/24 0900 Place and maintain sequential compression device Start: 08/02/24 0738   Antimicrobials: None currently Fluid: Tube feeding Consultants: Neurology, palliative care Family Communication: None at  bedside  Status: Inpatient Level of care:  Progressive   Patient is from: Home Needs to continue in-hospital care: Mental status remains poor Anticipated d/c to: Unclear at this time      Diet:  Diet Order             Diet regular Room service appropriate? Yes with Assist; Fluid consistency: Thin  Diet effective now                   Scheduled Meds:  amLODipine   10 mg Per Tube Daily   atorvastatin   80 mg Per Tube QHS   calcium  gluconate  2 g Intravenous Once   Chlorhexidine  Gluconate Cloth  6 each Topical Daily   enoxaparin  (LOVENOX ) injection  40 mg Subcutaneous Q24H   free water   75 mL Per Tube Q4H   gabapentin   100 mg Per Tube Q8H   heparin  sodium (porcine)  1,000 Units Intracatheter Once   insulin  aspart  0-9 Units Subcutaneous Q4H   insulin  glargine  15 Units Subcutaneous BID   levETIRAcetam   500 mg Intravenous Q12H   lidocaine   1 patch Transdermal Daily   nystatin   100,000 Units Topical TID   mouth rinse  15 mL Mouth Rinse 4 times per day   oxyCODONE   5 mg Oral TID   pantoprazole  (PROTONIX ) IV  40 mg Intravenous Q12H   polyethylene glycol  17 g Oral Daily   propranolol   10 mg Per Tube TID   sodium chloride  flush  10-40 mL Intracatheter Q12H    PRN meds: acetaminophen , ALPRAZolam , artificial tears, diphenhydrAMINE , guaiFENesin , HYDROmorphone  (DILAUDID ) injection **OR** [DISCONTINUED]  HYDROmorphone  (DILAUDID ) injection, LORazepam , naLOXone  (NARCAN )  injection, ondansetron  **OR** ondansetron  (ZOFRAN ) IV, mouth rinse, oxyCODONE  **OR** oxyCODONE , prochlorperazine , senna-docusate, sodium chloride  flush   Infusions:   citrate dextrose      feeding supplement (GLUCERNA 1.5 CAL) 1,000 mL (09/02/24 1239)    Antimicrobials: Anti-infectives (From admission, onward)    Start     Dose/Rate Route Frequency Ordered Stop   08/16/24 1100  cefTRIAXone  (ROCEPHIN ) 2 g  in sodium chloride  0.9 % 100 mL IVPB        2 g 200 mL/hr over 30 Minutes Intravenous Every 24 hours  08/16/24 1001 08/20/24 1214   07/23/24 0600  vancomycin  (VANCOCIN ) IVPB 1000 mg/200 mL premix  Status:  Discontinued        1,000 mg 200 mL/hr over 60 Minutes Intravenous Every 12 hours 07/22/24 1834 07/24/24 1808   07/22/24 2200  meropenem  (MERREM ) 2 g in sodium chloride  0.9 % 100 mL IVPB  Status:  Discontinued        2 g 280 mL/hr over 30 Minutes Intravenous Every 8 hours 07/22/24 1601 07/24/24 1808   07/22/24 1700  vancomycin  (VANCOREADY) IVPB 1500 mg/300 mL        1,500 mg 150 mL/hr over 120 Minutes Intravenous  Once 07/22/24 1601 07/22/24 2031   07/22/24 1645  ampicillin  (OMNIPEN) 2 g in sodium chloride  0.9 % 100 mL IVPB  Status:  Discontinued        2 g 300 mL/hr over 20 Minutes Intravenous Every 6 hours 07/22/24 1557 07/24/24 1800   07/19/24 0530  meropenem  (MERREM ) 1 g in sodium chloride  0.9 % 100 mL IVPB  Status:  Discontinued        1 g 200 mL/hr over 30 Minutes Intravenous Every 8 hours 07/19/24 0438 07/22/24 1601   07/16/24 0900  ceFEPIme  (MAXIPIME ) 2 g in sodium chloride  0.9 % 100 mL IVPB  Status:  Discontinued        2 g 200 mL/hr over 30 Minutes Intravenous Every 8 hours 07/16/24 0811 07/19/24 0409   07/16/24 0815  ceFEPIme  (MAXIPIME ) 2 g in sodium chloride  0.9 % 100 mL IVPB  Status:  Discontinued        2 g 200 mL/hr over 30 Minutes Intravenous  Once 07/16/24 0803 07/16/24 0811   07/16/24 0330  cefTRIAXone  (ROCEPHIN ) 2 g in sodium chloride  0.9 % 100 mL IVPB        2 g 200 mL/hr over 30 Minutes Intravenous  Once 07/16/24 0315 07/16/24 0412       Objective: Vitals:   09/02/24 0833 09/02/24 1208  BP: (!) (P) 118/54 (P) 118/62  Pulse: (P) 76 (P) 75  Resp: (P) 18 (P) 17  Temp: (P) 98.7 F (37.1 C) (P) 98.6 F (37 C)  SpO2: (P) 100% (P) 100%    Intake/Output Summary (Last 24 hours) at 09/02/2024 1314 Last data filed at 09/02/2024 1252 Gross per 24 hour  Intake 490 ml  Output 2550 ml  Net -2060 ml   Filed Weights   08/27/24 0500 08/29/24 0853 08/31/24 0500   Weight: 87.2 kg 92.1 kg 95.3 kg   Weight change:  Body mass index is 38.41 kg/m.   Physical Exam: General exam: Pleasant, middle-aged female, looks older for age Skin: No rashes, lesions or ulcers. HEENT: Atraumatic, normocephalic, no obvious bleeding Lungs: Clear to auscultation bilaterally,  CVS: S1, S2, no murmur,   GI/Abd: Soft, nontender, nondistended, bowel sound present,   CNS: Tries to open eyes, but mostly talks with eyes closed.  Oriented to self and place only.  Unable to follow motor commands Extremities: No pedal edema, no calf tenderness,   Data Review: I have personally reviewed the laboratory data and studies available.  F/u labs ordered Unresulted Labs (From admission, onward)     Start     Ordered   08/05/24 0500  CBC  (enoxaparin  (LOVENOX ))  Every 7 days,   R  Question:  Specimen collection method  Answer:  Unit=Unit collect   08/04/24 0735   Signed and Held  Basic metabolic panel  Once,   R       Comments: Prior to TPE   Question:  Specimen collection method  Answer:  Unit=Unit collect   Signed and Held   Signed and Held  Basic metabolic panel  ONCE - STAT,   R       Comments: Prior to TPE   Question:  Specimen collection method  Answer:  Unit=Unit collect   Signed and Held            Signed, Chapman Rota, MD [facility] 09/02/2024  "

## 2024-09-02 NOTE — Plan of Care (Addendum)
 Persistent hypotension  RN reported that patient blood pressure is soft 91/54.  Have 6 run of V. tach.  Otherwise hemodynamically stable.  Holding amlodipine , starting midodrine 5 mg per tube and giving 500 mL of LR bolus.  Mishka Stegemann, MD [facility] 09/02/2024, 8:57 PM

## 2024-09-02 NOTE — Progress Notes (Signed)
 RN was called by tele, patient had 6 beats of VT, patient assessed, VS BP 91/54, HR 72, O2  100 on 2L, Temp 98.8. Patient denied chest palpitation, dizziness or lightheaded. On call MD made aware, midodrine and 500 cc of LR bolus ordered per on call MD. Plan of care ongoing.   Daril ORN RN   09/02/2024 2030

## 2024-09-02 NOTE — Progress Notes (Signed)
 NEUROLOGY CONSULT FOLLOW UP NOTE   Date of service: September 02, 2024 Patient Name: Brenda [name] MRN:  [id number] DOB:  1961/02/27  Interval Hx/subjective   She continues to be more clear than previoulsy, but still slightly confused.   Vitals   Vitals:   09/02/24 0411 09/02/24 0833 09/02/24 1208 09/02/24 1614  BP: (!) 118/59 (!) (P) 118/54 (P) 118/62 (!) 112/45  Pulse: 73 (P) 76 (P) 75 71  Resp: 17 (P) 18 (P) 17 17  Temp: 98.7 F (37.1 C) (P) 98.7 F (37.1 C) (P) 98.6 F (37 C) 98.9 F (37.2 C)  TempSrc: Oral (P) Axillary (P) Axillary Oral  SpO2: 98% (P) 100% (P) 100% 99%  Weight:      Height:         Physical Exam    Neuro: Mental Status: She is awake, conversant, states that she has been having problems with her memory when I ask her what month it is in unable to answer, she does know that she is in the hospital. Cranial Nerves: Pupils are unequal, left is larger and less reactive, documented repeatedly in previous notes as chronic.  She has difficulty going fully to either direction, but she is able to cross midline in both directions with extraocular movements, face is symmetric. Motor: Tone is markedly improved compared to 12/21, I am able to move her arms passively without significant rigidity.  She has severe weakness but is able to wiggle toes bilaterally and able to squeeze hands bilaterally.  Her grip strength seems slightly improved to me compared to Monday, but the difference is not truly significant or definite.     Medications  Current Facility-Administered Medications:    acetaminophen  (TYLENOL ) tablet 650 mg, 650 mg, Oral, Q4H PRN, Michaela Aisha SQUIBB, MD, 650 mg at 08/31/24 0500   ALPRAZolam  (XANAX ) tablet 0.25 mg, 0.25 mg, Per Tube, TID PRN, Wonda, Josseline P, PA-C, 0.25 mg at 08/31/24 2352   amLODipine  (NORVASC ) tablet 10 mg, 10 mg, Per Tube, Daily, Drusilla, Sabas RAMAN, MD, 10 mg at 09/02/24 9076   artificial tears (LACRILUBE) ophthalmic  ointment, , Both Eyes, Q4H PRN, Tariq, Hassan, MD, Given at 09/01/24 1211   atorvastatin  (LIPITOR ) tablet 80 mg, 80 mg, Per Tube, QHS, Sigdel, Santosh, MD, 80 mg at 09/01/24 2229   calcium  gluconate inj 10% (1 g) URGENT USE ONLY!, 2 g, Intravenous, Once, Michaela Aisha SQUIBB, MD   Chlorhexidine  Gluconate Cloth 2 % PADS 6 each, 6 each, Topical, Daily, Chavez, Abigail, NP, 6 each at 09/02/24 9073   citrate dextrose  (ACD-A  anticoagulant) solution 1,000 mL, 1,000 mL, Other, Continuous, Michaela Aisha SQUIBB, MD, 1,000 mL at 08/29/24 1115   diphenhydrAMINE  (BENADRYL ) capsule 25 mg, 25 mg, Oral, Q6H PRN, Michaela Aisha SQUIBB, MD, 25 mg at 08/31/24 0500   enoxaparin  (LOVENOX ) injection 40 mg, 40 mg, Subcutaneous, Q24H, Pahwani, Ravi, MD, 40 mg at 09/02/24 9076   feeding supplement (GLUCERNA 1.5 CAL) liquid 1,000 mL, 1,000 mL, Per Tube, Continuous, Pahwani, Ravi, MD, Last Rate: 45 mL/hr at 09/02/24 1239, 1,000 mL at 09/02/24 1239   free water  75 mL, 75 mL, Per Tube, Q4H, Cosette Blackwater, MD, 75 mL at 09/02/24 1650   gabapentin  (NEURONTIN ) 250 MG/5ML solution 100 mg, 100 mg, Per Tube, Q8H, Perri DELENA Meliton Mickey., MD, 100 mg at 09/02/24 1439   guaiFENesin  (ROBITUSSIN) 100 MG/5ML liquid 15 mL, 15 mL, Per Tube, Q6H PRN, Drusilla Sabas RAMAN, MD, 15 mL at 08/25/24 0826   heparin  sodium (porcine) injection  1,000 Units, 1,000 Units, Intracatheter, Once, Michaela Aisha SQUIBB, MD   HYDROmorphone  (DILAUDID ) injection 0.5 mg, 0.5 mg, Intravenous, Q2H PRN, 0.5 mg at 09/02/24 1858 **OR** [DISCONTINUED] HYDROmorphone  (DILAUDID ) injection 1 mg, 1 mg, Intravenous, Q2H PRN, Perri DELENA Meliton Mickey., MD   insulin  aspart (novoLOG ) injection 0-9 Units, 0-9 Units, Subcutaneous, Q4H, Perri DELENA Meliton Mickey., MD, 1 Units at 09/02/24 1721   insulin  glargine (LANTUS ) injection 15 Units, 15 Units, Subcutaneous, BID, Vernon Ranks, MD, 15 Units at 09/02/24 0920   levETIRAcetam  (KEPPRA ) undiluted injection 500 mg, 500 mg, Intravenous, Q12H,  Howerter, Justin B, DO, 500 mg at 09/02/24 9077   lidocaine  (LIDODERM ) 5 % 1 patch, 1 patch, Transdermal, Daily, Vernon Ranks, MD, 1 patch at 09/02/24 1719   LORazepam  (ATIVAN ) injection 1 mg, 1 mg, Intravenous, Q2H PRN, Cooper, Josseline P, PA-C, 1 mg at 09/02/24 9344   naloxone  (NARCAN ) injection 0.4 mg, 0.4 mg, Intravenous, PRN, Rathore, Vasundhra, MD   nystatin  (MYCOSTATIN /NYSTOP ) topical powder 1 Application, 100,000 Units, Topical, TID, Pahwani, Ravi, MD, 1 Application at 09/02/24 1655   ondansetron  (ZOFRAN ) tablet 4 mg, 4 mg, Per Tube, Q6H PRN **OR** ondansetron  (ZOFRAN ) injection 4 mg, 4 mg, Intravenous, Q6H PRN, Cosette Blackwater, MD   Oral care mouth rinse, 15 mL, Mouth Rinse, 4 times per day, Perri DELENA Meliton Mickey., MD, 15 mL at 09/02/24 1635   Oral care mouth rinse, 15 mL, Mouth Rinse, PRN, Perri DELENA Meliton Mickey., MD, 15 mL at 08/21/24 1145   oxyCODONE  (Oxy IR/ROXICODONE ) immediate release tablet 2.5 mg, 2.5 mg, Oral, Q4H PRN **OR** oxyCODONE  (Oxy IR/ROXICODONE ) immediate release tablet 5 mg, 5 mg, Oral, Q4H PRN, Perri DELENA Meliton Mickey., MD, 5 mg at 09/02/24 0310   oxyCODONE  (Oxy IR/ROXICODONE ) immediate release tablet 5 mg, 5 mg, Oral, TID, Dahal, Binaya, MD, 5 mg at 09/02/24 1720   pantoprazole  (PROTONIX ) injection 40 mg, 40 mg, Intravenous, Q12H, Perri DELENA Meliton Mickey., MD, 40 mg at 09/02/24 0919   polyethylene glycol (MIRALAX  / GLYCOLAX ) packet 17 g, 17 g, Oral, Daily, Pahwani, Ranks, MD, 17 g at 09/02/24 9074   prochlorperazine  (COMPAZINE ) injection 10 mg, 10 mg, Intravenous, Q6H PRN, Howerter, Justin B, DO, 10 mg at 08/31/24 0500   propranolol  (INDERAL ) tablet 10 mg, 10 mg, Per Tube, TID, Dahal, Chapman, MD, 10 mg at 09/02/24 1720   senna-docusate (Senokot-S) tablet 1 tablet, 1 tablet, Oral, QHS PRN, Foust, Katy L, NP, 1 tablet at 08/28/24 2029   sodium chloride  flush (NS) 0.9 % injection 10-40 mL, 10-40 mL, Intracatheter, Q12H, Sigdel, Santosh, MD, 20 mL at 09/02/24 0927   sodium  chloride flush (NS) 0.9 % injection 10-40 mL, 10-40 mL, Intracatheter, PRN, Mcarthur Pick, MD  Labs and Diagnostic Imaging   CBC:  Recent Labs  Lab 08/29/24 0137 08/30/24 2105 08/31/24 0650 09/02/24 0332  WBC 6.5   < > 4.4 4.4  NEUTROABS 4.7  --  3.0  --   HGB 8.5*   < > 7.5* 7.9*  HCT 26.2*   < > 23.1* 25.1*  MCV 95.3   < > 96.3 97.7  PLT 177   < > 152 210   < > = values in this interval not displayed.    Basic Metabolic Panel:  Lab Results  Component Value Date   NA 136 08/31/2024   K 4.0 08/31/2024   CO2 26 08/31/2024   GLUCOSE 167 (H) 08/31/2024   BUN 13 08/31/2024   CREATININE <0.30 (L) 08/31/2024   CALCIUM  8.3 (L)  08/31/2024   GFRNONAA NOT CALCULATED 08/31/2024   GFRAA 94 12/16/2019   Lipid Panel:  Lab Results  Component Value Date   LDLCALC 28 05/30/2024   HgbA1c:  Lab Results  Component Value Date   HGBA1C 7.2 (H) 06/01/2024   Urine Drug Screen:     Component Value Date/Time   LABOPIA POSITIVE (A) 04/20/2024 1817   COCAINSCRNUR NONE DETECTED 04/20/2024 1817   LABBENZ NONE DETECTED 04/20/2024 1817   AMPHETMU NONE DETECTED 04/20/2024 1817   THCU NONE DETECTED 04/20/2024 1817   LABBARB NONE DETECTED 04/20/2024 1817    Alcohol Level     Component Value Date/Time   ETH <15 04/20/2024 1344   INR  Lab Results  Component Value Date   INR 1.0 08/18/2024   APTT  Lab Results  Component Value Date   APTT 67 (H) 06/21/2024   Imaging personally reviewed MRI Brain(Personally reviewed): 1. Similar evolving infarcts in the anterior and posterior circulation. 2. No new infarcts, progressive mass effect or acute hemorrhage.  Assessment   Judie Hollick is a 63 y.o. female past history of CAD, DM, obesity, OSA, metastatic endometrial cancer, prior stroke, DVT, PE on anticoagulation, recurrent UTIs with concern for cefepime  toxicity causing seizures for which she which she was started on Keppra  and cefepime  was discontinued, continues to have  neurological worsening despite cessation of seizures and no new strokes on imaging.  Cervical spine imaging also unremarkable for acute process.  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 Her exam with waxy flexibility and decreased but intermittent verbalization could have been consistent with catatonia, but the time course would be unsual and lack of response to Ativan  made this much less likely.  At this point, I favor an autoimmune nature to her stiffness and some of her altered mental status.  I also think that her current mental status changes likely have delirium playing a significant role, including some side effects of her pain regimen.  We are getting to a point where pain control I think will have to be balanced with her mental status, and she may be getting close to needing to consider palliative care.  Steroids were tried with no improvement and PLEX was pursued in case of an autoimmune nature or side effect of medication, and she does appear to have responded significantly to this.  I do not think I would pursue further immunomodulation at the current time, but if this was related to her Keytruda , then she may have gradual continued improvement in her exam over time.  Unfortunately, I think pain control and her general debilitation will likely play a bigger role in her prognosis moving forward.  Recommendations  Continue discussions with palliative care, versus considering if there are any options with oncology if she were to continue to improve from a mental status standpoint. At this point, I do not have any active interventions from a neurology standpoint to offer, I think she will likely need prolonged  rehab and her recovery may be incomplete. Neurology will be available on an as-needed basis moving forward, please call if we can be of any further assistance. ______________________________________________________________________  Aisha Seals, MD [facility]   If 7pm- 7am, please page neurology on call as listed in [facility]ION.

## 2024-09-02 NOTE — Progress Notes (Signed)
 IP PROGRESS NOTE  Subjective:   [name] has been alert and yelling during the night per the bedside RN.  Her family was not present when I saw her at approximately 6:30 AM.  Objective: Vital signs in last 24 hours: Blood pressure (!) (P) 118/54, pulse (P) 76, temperature (P) 98.7 F (37.1 C), temperature source (P) Axillary, resp. rate (P) 18, height 5' 2 (1.575 m), weight 210 lb (95.3 kg), last menstrual period 10/30/2014, SpO2 (P) 100%.  Intake/Output from previous day: 12/30 0701 - 12/31 0700 In: 510 [I.V.:60; NG/GT:450] Out: 2050 [Urine:2050]  Physical Exam: Alert Neurologic: Speaking, follows commands, opens/closes eyes, opens mouth, weak bilateral grip strength, moves toes, nose she is in the hospital, not oriented to diagnosis or year Abdomen: Soft, no mass, nontender Portacath/PICC-without erythema  Lab Results: Recent Labs    08/31/24 0650 09/02/24 0332  WBC 4.4 4.4  HGB 7.5* 7.9*  HCT 23.1* 25.1*  PLT 152 210    BMET Recent Labs    08/30/24 2105 08/31/24 0650  NA 138 136  K 4.1 4.0  CL 105 104  CO2 27 26  GLUCOSE 169* 167*  BUN 14 13  CREATININE 0.34* <0.30*  CALCIUM  8.5* 8.3*     Medications: I have reviewed the patient's current medications.  Assessment/Plan:  Endometrial cancer Diagnosed in 2023, status post surgery and radiation Recurrent disease July 2025 with a left retroperitoneal mass, biopsy confirmed adenocarcinoma, ER positive, MSI-high, tumor mutation burden 24, PD-L1 75%, PIK 3CA mutated, PTEN mutated, HER2 1+, loss of MLH1 expression 04/03/2024 CT chest: Spiculated 9 x 12 mm right upper lobe and part solid 4 x 6 mm right upper lobe nodules Cycle 1 paclitaxel /carboplatin /pembrolizumab  05/01/2024 Cycle 2 paclitaxel /carboplatin /pembrolizumab  06/30/2024 06/19/2024 CT abdomen/pelvis: Right lower lobe pulmonary embolism, splenic infarct, stable size of the left psoas mass with evidence of erosive change at the anterior aspect of L2,  decreased size of left external iliac node 08/09/2024 CTs: New psoas muscle mass (tumor versus hematoma), left psoas/paraspinal mass has decreased in size, by my review the previously noted lung nodules have resolved  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  DVT 06/02/2024 4.  Pulmonary embolism noted on CT chest 06/19/2024 5.  Diabetes 6.  CHF/CAD 7.  Seizures, cefepime  toxicity?  November 2025 8.  Progressive generalized loss of motor function-related to CVAs?,  Paraneoplastic? 08/11/2024-Solu-Medrol  daily x 3 Therapy plasma exchange 08/21/2024, 08/23/2024, 08/25/2024, 08/27/2024, 08/29/2024 9.  Respiratory failure 10.  Pain secondary to #1 11.  Anemia-stable 12.  History of elevated liver enzymes, normal 08/30/2024  Brenda [name] has a history of endometrial cancer initially diagnosed in 2020.  She was diagnosed with recurrent disease involving a left retroperitoneal mass in July 2025.  She completed 2 treatments with paclitaxel /carboplatin /pembrolizumab .  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  She completed 2 cycles of systemic therapy for treatment of the uterine cancer.  I reviewed the 08/09/2024 restaging CT findings with Ms. Westcott and her daughter.  The known malignancy at the left retroperitoneum has improved significantly.  The significance of the right psoas mass is unclear.  I suspect this is a benign finding.      The etiology of the diffuse generalized weakness is unclear.  There is likely  a component related to the embolic CVAs and deconditioning, but the evolution of the loss of motor function over the past several weeks appears to be more severe than expected.  She  has undergone an extensive neurologic evaluation over the past month.  Neurology is following her in the hospital..  Neurology feels her symptoms may be related to PD-1 inhibitor neurotoxicity, a paraneoplastic syndrome, or another inflammatory process.  She started high-dose Solu-Medrol  on 08/11/2024, given daily for 3 days.  Her neurologic status did not improve.  She began plasma exchange on 08/21/2024.  She completed treatment #5 on 08/29/2024.  Her neurologic status appears partially improved.   The etiology of her pain is unclear based on the restaging CT.  The retroperitoneal mass is much smaller.  She could be having pain from the right psoas lesion or another etiology.  There is no clear evidence for progression of the metastatic carcinoma.  I recommend attempting to wean the CNS acting agents including narcotics and lorazepam .  She has persistent anemia.  The anemia is multifactorial including components related to bleeding, phlebotomy, and chronic disease.  I discussed the case with her husband by telephone at approximately 1:30 PM.  He understands she may not continue to recover.  We discussed potential disposition plans including nursing facility placement and home.  Recommendations: Wean narcotic and anxiolytic regimen as tolerated to see if her mental status will further improve Evaluation/management of neurologic status per neurology Anticoagulation resumed with prophylactic dose Lovenox  08/31/2024 tube feedings, follow-up with speech pathology Systemic treatment for the uterine cancer will remain on hold Continue goals of care discussions with palliative care Please call oncology as needed           LOS: 47 days   Arley Hof, MD   09/02/2024, 9:29 AM

## 2024-09-02 NOTE — Plan of Care (Signed)
   Problem: Education: Goal: Ability to describe self-care measures that may prevent or decrease complications (Diabetes Survival Skills Education) will improve Outcome: Progressing   Problem: Coping: Goal: Ability to adjust to condition or change in health will improve Outcome: Progressing   Problem: Fluid Volume: Goal: Ability to maintain a balanced intake and output will improve Outcome: Progressing

## 2024-09-02 NOTE — Progress Notes (Signed)
 Occupational Therapy Treatment Patient Details Name: Brenda [name] MRN: [id number] DOB: Jan 03, 1961 Today's Date: 09/02/2024   History of present illness 63 yo female who returned to hospital on 07/15/2024 with ongoing signs and symptoms of UTI with generalized weakness. Pt with 4 seizures on EEG 11/16. Working diagnosis of cefepime  neurotoxicity. Prior admit 11/9 with urinary symptoms and AMS d/c home on OP ABX. PMH includes but is not limited to: HTN, HLD, DM, depression/anxiety, CVA (CIR recently), DVT/PE, OSA on CPAP, chronic respiratory failure on 2 L/min at baseline, and metastatic endometrial cancer with chronic cancer-related pain.   OT comments  Pt seen for OT treatment this AM, followed-up re: B palmar guards and need for potential additional cushioning (per previous OT note). No family at bedside. Pt without UE tone noted at rest, but RUE>LUE edema observed. Pt tolerated donning B palmar guards, however removed given lack of resting tone. Tolerated simple UE exercises as detailed below. She followed ~10% of commands, although more verbal this session. Responses to conversation irrelevant. Given pt's presentation and lack of OT progress, no further acute OT services are warranted at this time. OT to sign-off.      If plan is discharge home, recommend the following:  Two people to help with walking and/or transfers;Two people to help with bathing/dressing/bathroom;Assistance with cooking/housework;Assistance with feeding;Direct supervision/assist for medications management;Direct supervision/assist for financial management;Assist for transportation;Help with stairs or ramp for entrance;Supervision due to cognitive status   Equipment Recommendations  Hospital bed;Hoyer lift    Recommendations for Other Services      Precautions / Restrictions Precautions Precautions: Fall Recall of Precautions/Restrictions: Impaired Precaution/Restrictions Comments: Cortrak, skin  integrity Restrictions Weight Bearing Restrictions Per Provider Order: No       Mobility Bed Mobility                    Transfers                         Balance                                           ADL either performed or assessed with clinical judgement   ADL Overall ADL's : Needs assistance/impaired                                       General ADL Comments: total care    Extremity/Trunk Assessment              Vision       Perception     Praxis     Communication Communication Communication: Impaired Factors Affecting Communication: Difficulty expressing self (pt speaking random thoughts irrelevant to conversation)   Cognition Arousal: Lethargic Behavior During Therapy: Flat affect Cognition: Difficult to assess Difficult to assess due to: Level of arousal           OT - Cognition Comments: eyes remained closed throughout session, but pt intermittently responding to conversation                 Following commands: Impaired Following commands impaired: Follows one step commands inconsistently      Cueing   Cueing Techniques: Verbal cues, Tactile cues  Exercises Exercises: General Upper Extremity General Exercises - Upper Extremity Wrist Flexion:  PROM, Both, Supine, 10 reps Wrist Extension: PROM, Both, Supine, 10 reps Digit Composite Flexion: PROM, Both, Supine, 10 reps    Shoulder Instructions       General Comments no family present, pt off EEG    Pertinent Vitals/ Pain       Pain Assessment Pain Assessment: Faces Faces Pain Scale: Hurts little more Pain Location: R hand/wrist with movement Pain Descriptors / Indicators: Grimacing, Moaning Pain Intervention(s): Limited activity within patient's tolerance, Repositioned  Home Living                                          Prior Functioning/Environment              Frequency            Progress Toward Goals  OT Goals(current goals can now be found in the care plan section)  Progress towards OT goals: Not progressing toward goals - comment     Plan      Co-evaluation                 AM-PAC OT 6 Clicks Daily Activity     Outcome Measure   Help from another person eating meals?: Total Help from another person taking care of personal grooming?: Total Help from another person toileting, which includes using toliet, bedpan, or urinal?: Total Help from another person bathing (including washing, rinsing, drying)?: Total Help from another person to put on and taking off regular upper body clothing?: Total Help from another person to put on and taking off regular lower body clothing?: Total 6 Click Score: 6    End of Session    OT Visit Diagnosis: Muscle weakness (generalized) (M62.81);Low vision, both eyes (H54.2);Feeding difficulties (R63.3);Cognitive communication deficit (R41.841) Symptoms and signs involving cognitive functions: Cerebral infarction   Activity Tolerance Patient limited by lethargy   Patient Left in bed;with call bell/phone within reach;with bed alarm set   Nurse Communication          Time: 8962-8954 OT Time Calculation (min): 8 min  Charges: OT General Charges $OT Visit: 1 Visit OT Treatments $Therapeutic Exercise: 8-22 mins  Eleah Lahaie M. [place], OTR/L [facility] [phone] Secure Chat Preferred  Emalie Mcwethy 09/02/2024, 12:58 PM

## 2024-09-03 DIAGNOSIS — Z515 Encounter for palliative care: Secondary | ICD-10-CM | POA: Diagnosis not present

## 2024-09-03 DIAGNOSIS — G893 Neoplasm related pain (acute) (chronic): Secondary | ICD-10-CM | POA: Diagnosis not present

## 2024-09-03 DIAGNOSIS — G9341 Metabolic encephalopathy: Secondary | ICD-10-CM | POA: Diagnosis not present

## 2024-09-03 DIAGNOSIS — R29898 Other symptoms and signs involving the musculoskeletal system: Secondary | ICD-10-CM | POA: Diagnosis not present

## 2024-09-03 DIAGNOSIS — E440 Moderate protein-calorie malnutrition: Secondary | ICD-10-CM | POA: Diagnosis not present

## 2024-09-03 DIAGNOSIS — F418 Other specified anxiety disorders: Secondary | ICD-10-CM | POA: Diagnosis not present

## 2024-09-03 DIAGNOSIS — N390 Urinary tract infection, site not specified: Secondary | ICD-10-CM | POA: Diagnosis not present

## 2024-09-03 DIAGNOSIS — Z7189 Other specified counseling: Secondary | ICD-10-CM | POA: Diagnosis not present

## 2024-09-03 LAB — GLUCOSE, CAPILLARY
Glucose-Capillary: 169 mg/dL — ABNORMAL HIGH (ref 70–99)
Glucose-Capillary: 145 mg/dL — ABNORMAL HIGH (ref 70–99)
Glucose-Capillary: 163 mg/dL — ABNORMAL HIGH (ref 70–99)
Glucose-Capillary: 171 mg/dL — ABNORMAL HIGH (ref 70–99)
Glucose-Capillary: 180 mg/dL — ABNORMAL HIGH (ref 70–99)
Glucose-Capillary: 160 mg/dL — ABNORMAL HIGH (ref 70–99)
Glucose-Capillary: 168 mg/dL — ABNORMAL HIGH (ref 70–99)
Glucose-Capillary: 196 mg/dL — ABNORMAL HIGH (ref 70–99)

## 2024-09-03 MED ORDER — LORAZEPAM 2 MG/ML IJ SOLN
1.0000 mg | INTRAMUSCULAR | Status: DC | PRN
  Administered 2024-09-03 – 2024-09-16 (×33): 1 mg via INTRAVENOUS
  Filled 2024-09-03 (×35): qty 1

## 2024-09-03 MED ORDER — HYDROMORPHONE HCL 1 MG/ML IJ SOLN
0.5000 mg | INTRAMUSCULAR | Status: DC | PRN
  Administered 2024-09-03 – 2024-09-08 (×15): 0.5 mg via INTRAVENOUS
  Filled 2024-09-03 (×17): qty 0.5

## 2024-09-03 MED ORDER — TRAZODONE HCL 50 MG PO TABS
25.0000 mg | ORAL_TABLET | Freq: Every evening | ORAL | Status: DC | PRN
  Administered 2024-09-03 – 2024-09-06 (×4): 25 mg via ORAL
  Filled 2024-09-03 (×4): qty 1

## 2024-09-03 NOTE — Plan of Care (Signed)
 Intermittently expressed about her pain, has unclear thinking and hallucinating as evidence by her inappropriate talks. Husband is with her from the evening time. Able to take 4-5 tablespoon of applesauce and pudding through mouth.  Problem: Coping: Goal: Ability to adjust to condition or change in health will improve Outcome: Progressing   Problem: Fluid Volume: Goal: Ability to maintain a balanced intake and output will improve Outcome: Progressing   Problem: Health Behavior/Discharge Planning: Goal: Ability to manage health-related needs will improve Outcome: Progressing   Problem: Metabolic: Goal: Ability to maintain appropriate glucose levels will improve Outcome: Progressing   Problem: Nutritional: Goal: Maintenance of adequate nutrition will improve Outcome: Progressing   Problem: Tissue Perfusion: Goal: Adequacy of tissue perfusion will improve Outcome: Progressing   Problem: Tissue Perfusion: Goal: Adequacy of tissue perfusion will improve Outcome: Progressing   Problem: Education: Goal: Knowledge of General Education information will improve Description: Including pain rating scale, medication(s)/side effects and non-pharmacologic comfort measures Outcome: Progressing   Problem: Clinical Measurements: Goal: Ability to maintain clinical measurements within normal limits will improve Outcome: Progressing   Problem: Clinical Measurements: Goal: Respiratory complications will improve Outcome: Progressing   Problem: Clinical Measurements: Goal: Cardiovascular complication will be avoided Outcome: Progressing   Problem: Activity: Goal: Risk for activity intolerance will decrease Outcome: Progressing   Problem: Skin Integrity: Goal: Risk for impaired skin integrity will decrease Outcome: Progressing   Problem: Safety: Goal: Ability to remain free from injury will improve Outcome: Progressing   Problem: Pain Managment: Goal: General experience of comfort  will improve and/or be controlled Outcome: Progressing   Problem: Safety: Goal: Ability to remain free from injury will improve Outcome: Progressing

## 2024-09-03 NOTE — Progress Notes (Signed)
 " Daily Progress Note   Date: 09/03/2024   Patient Name: Brenda [name]  DOB: 1961-05-19  MRN: [id number]  Age / Sex: 64 y.o., female  Attending Physician: Arlice Reichert, MD Primary Care Physician: [facility] Admit Date: 07/15/2024 Length of Stay: 48 days  Reason for Follow-up: Establishing goals of care  Assessment & Plan:   HPI/Patient Profile:   64 y.o. 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   Palliative medicine consulted for goals of care conversation.  SUMMARY OF RECOMMENDATIONS Continue current care plan Goals of care clear at this time for life-prolonging measures and pursuit of further treatment/rehab No decision on PEG today, will continue discussions Provided husband with office of patient experience contact information PMT will continue to follow and support  Symptom Management:  Oxycodone  5 mg Q8 for pain Oxycodone  2.5-5 mg Q4 PRN for breakthrough pain Decreased frequency of Ativan  IV to Q4 PRN for agitation Decreased frequency of Dilaudid  IV to Q4 PRN for severe pain Xanax  0.25 mg TID PRN for anxiety Ordered trazodone  as needed at bedtime for sleep  Code Status: DNR - Limited (DNR/DNI)  Prognosis: Unable to determine  Discharge Planning: To Be Determined  Discussed with: [name], [name], patient's husband  Subjective:   Subjective: Medical records reviewed including progress notes, labs, imaging, MAR.  Patient received 5 doses of as needed Dilaudid  in the past 24 hours, as well as 1 dose of as needed Ativan  and 1 dose of as needed Xanax .  Patient assessed at the bedside.  She was sleeping comfortably.  I did not attempt to arouse her.  No family was present visiting.  Today's Discussion:  Called patient's husband Brenda [name] for ongoing goals of care  discussions and palliative support.  We reviewed interval history since our last conversation.  He shares that he wants many more years with his wife and he continues to have some reservations about the quality of care she has received at [facility] thus far.  Emotional support and therapeutic listening was provided.  Explored husband's thoughts and feelings surrounding patient's nutritional needs.  Reviewed the need for PEG if the focus is recovery and to continue life-prolonging treatment, ensuring she continues to receive enough nutrition to improve.  He does not seem ready for this decision today.  He wants to talk to someone about his concerns and questions about getting a second opinion.  Provided with office of patient experience phone number for inpatient concerns.  Discussed that if she no longer is getting the care she needs from the hospital, it may be more feasible to take her to outpatient appointments for second opinion(s) from rehab.  He understands the current barriers to discharge.  No other needs at this time.  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  Constitutional:      Appearance: She is ill-appearing.     Comments: Somnolent but awake to voice.   HENT:     Head: Normocephalic and atraumatic.     Nose:     Comments: Cortrak in place.  Cardiovascular:     Rate and Rhythm: Normal rate.  Pulmonary:     Effort: Pulmonary effort is  normal.  Skin:    General: Skin is dry.  Neurological:     Comments: Sleeping, did not assess    Palliative Assessment/Data: 30% (tube feeds)   Existing Vynca/ACP Documentation: None  Thank you for allowing us  to participate in the care of Brenda [name] PMT will continue to support holistically.  I personally spent a total of 40 minutes in the care of the patient today including preparing to see the patient, getting/reviewing separately obtained history, performing a medically appropriate exam/evaluation, referring and communicating with other health care professionals, and documenting clinical information in the EHR.   Brenda [name] P Brenda Sumners, PA-C  Palliative Medicine Team  Team Phone # [phone] (Nights/Weekends) 09/03/2024 9:19 AM  "

## 2024-09-03 NOTE — Progress Notes (Signed)
 " PROGRESS NOTE  Brenda [name]  DOB: 1961-04-06  PCP: [facility] FMW:[id number]  DOA: 07/15/2024  LOS: 48 days  Hospital Day: 51  Subjective: Patient was seen and examined this morning. Lying in bed.  Yelling with eyes closed.  Able to verbalize clear words but disoriented. Family not at bedside. Afebrile, hemodynamically stable.   Brief narrative: Brenda [name] is a 64 y.o. female with PMH significant for metastatic endometrial cancer, chronic cancer-related pain, DM2, HTN, HLD, obesity, OSA, CAD/stent, CHF, CVA, h/o DVT and PE anticoagulated on Lovenox , chronic hypoxic respiratory failure on 2L via nasal cannula. Recently hospitalized 11/9 -11/11 for UTI, treated with IV antibiotics and discharged on oral antibiotics. Next day 11/12, patient presented to [facility] ED with chills and weakness.   She was found to have UTI and was started on IV cefepime .   Unfortunately mental status worsened  11/15, patient was noted to have a stiffening of upper extremities, she was transferred to [facility] for continuous EEG monitoring. 11/16, she was noted to have seizures and video EEG.  Started on Keppra . Meningitis/encephalitis was suspected and patient was started on empiric blissful antibiotics.  CSF panel unremarkable and hence antibiotics were stopped  12/7, because of development of rigidity, neurology and oncology were reconsulted.  Palliative care consulted as well for cancer-related pain   Assessment and plan: Seizures Workup as above.  Currently on Keppra . Seizure precautions  Acute metabolic encephalopathy  Generalized rigidity Patient had significant altered mental status and generalized rigidity. Multiple possible etiologies were considered.   Neurology and oncology consult appreciated.  Cefepime  was discontinued.  Prozac  was discontinued.  EEG did not show epileptiform abnormalities.  CT head without acute intracranial abnormality.  MRI brain  showed evolving infarcts  MRI C spine did not show any evidence of metastatic disease.  Other possible etiologies considered were - neurotoxicity from Keytruda  which was being used to treat her cancer.  Keytruda  was stopped.  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 S/p high dose steroids 12/9-12/11.  S/p trial sinimet.  S/p baclofen .   S/p 5 days of PLEX, completed on 12/28  Per neurology note from 12/29, likely favor an autoimmune process.  Mental status also influenced probably by pain regimen as well as hospital induced delirium.   Probably had autoimmune nature to her stiffness and some of her mental status alteration. Overall, patient's mental status seems to be gradually improving.  Neurology has signed off. Continue delirium precautions  Pain regimen adjusted per palliative care as below 12/31, I discussed her care with oncology Dr. Cloretta as well as palliative.  Dr. Cloretta subsequently called patient's husband who is hopeful that patient will continue to recover and be a SNF candidate.  Currently she has tube feeding ongoing Cortrak, needs it removed or changed to PEG prior to considering SNF.   Metastatic Endometrial Cancer Cancer Related Pain  Seen by oncology and palliative care. She had had multiple imagings done during this hospitalization.  Per oncology note, there is no clear evidence of progression of the metastatic carcinoma. Keytruda  on hold as explained above. Pain regimen adjusted per palliative care as below Pain regimen --- Scheduled: Oxycodone  5 mg every 8 hours.  I had stopped oxycodone  yesterday at 12/30 but patient had significant pain and hence I restarted that the same evening --- PRN: IV Dilaudid , oxycodone , Tylenol     Recent DVT/PE,Splenic Infarct In October 2025, patient was diagnosed to have right leg DVT,  PE as well as a splenic infarct and she was started on anticoagulation.   This  hospitalization, anticoagulation was held due to psoas hematoma and GI bleed.   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   Patient had BRBPR during this hospitalization BRBPR resolved at this time CT GI bleed study did not show any evidence GI bleed. Patient was given 1 unit of PRBC transfusion on 12/1.  Also given IV Venofer  Hemoglobin currently between 7 and 8 for last few days.   Anticoagulation on hold. On DVT prophylaxis with Lovenox  subcu Recent Labs    03/29/24 1512 03/30/24 0531 04/21/24 1541 04/22/24 0316 07/19/24 0245 07/20/24 1120 08/03/24 0319 08/04/24 0555 08/27/24 1443 08/29/24 0137 08/30/24 2105 08/31/24 0650 09/02/24 0332  HGB  --    < >  --    < > 10.8*   < > 6.8*   < > 8.8*  9.2* 8.5* 7.5* 7.5* 7.9*  MCV  --    < >  --    < > 83.1   < > 86.2   < > 96.9 95.3 97.5 96.3 97.7  VITAMINB12 487  --  764  --  512  --   --   --   --   --   --   --   --   FOLATE 10.2  --   --   --  17.7  --   --   --   --   --   --   --   --   FERRITIN 157  --   --   --   --   --  100  --   --   --   --   --   --   TIBC 224*  --   --   --   --   --  231*  --   --   --   --   --   --   IRON  20*  --   --   --   --   --  29  --   --   --   --   --   --   RETICCTPCT 1.7  --   --   --   --   --   --   --   --   --   --   --   --    < > = values in this interval not displayed.   Klebsiella Pneumoniae UTI Completed 5-day course of antibiotics   Dysphagia Moderate malnutrition With altered mental status, patient is unable to take oral intake.  She has been receiving tube feeds through [employer] therapist and dietitian following.  eventually decision regarding PEG will need to be made   Type 2 diabetes mellitus A1c 7.2 on 06/01/2024 Currently on Lantus  15 units twice daily, insulin  aspart SSI every 4 hours  Continue to monitor Recent Labs  Lab 09/02/24 2305 09/03/24 0340 09/03/24 0740 09/03/24 0939 09/03/24 1110   GLUCAP 170* 169* 145* 163* 171*   Hypertension Currently on midodrine 5 mg twice daily and propranolol  10 mg 3 times daily  Blood pressure this morning 160s.  I would hold for these medicines at this time and monitor.   Depression/anxiety Prozac  was discontinued due to rigidity   H/o CAD, Stroke HLD Currently on Lipitor  80 mg daily.  Anticoagulation on hold as discussed above  Abnormal Thyroid   Function Tests It seems early in the course of hospitalization, TSH level was low and free T4 level was elevated at 1.41 on 11/16 Patient was started on propranolol  20 mg 3 times daily.  Subsequent repeat TSH level normal at 0.9 on 12/7 Propranolol  has been intermittently held because of low blood pressure.  I think propranolol  dose needs to be tapered off. 12/30, I tapered the dose down to 10 mg 3 times daily. 1/1, will stop propranolol  today and monitor. Recent Labs    04/21/24 1541 04/23/24 1330 04/27/24 1438 07/19/24 0245 07/22/24 0833 08/09/24 1221  TSH 1.692 2.314 3.990 0.230* 0.315* 1.556     Nutrition Status: Nutrition Problem: Moderate Malnutrition Etiology: chronic illness (cancer) Signs/Symptoms: percent weight loss, mild muscle depletion, moderate muscle depletion (12% x 6 months) Percent weight loss: 12 % Interventions: Prostat, Tube feeding, MVI   PT Orders:   PT Follow up Rec: Skilled Nursing-Short Term Rehab (<3 Hours/Day)08/11/2024 1524    Goals of care   Code Status: Limited: Do not attempt resuscitation (DNR) -DNR-LIMITED -Do Not Intubate/DNI   Goals of Care Palliative following.   DVT prophylaxis:  enoxaparin  (LOVENOX ) injection 40 mg Start: 08/29/24 0900 Place and maintain sequential compression device Start: 08/02/24 0738   Antimicrobials: None currently Fluid: Tube feeding Consultants: Neurology, palliative care Family Communication: None at bedside  Status: Inpatient Level of care:  Progressive   Patient is from: Home Needs to continue  in-hospital care: Mental status remains poor Anticipated d/c to: Unclear at this time      Diet:  Diet Order             Diet regular Room service appropriate? Yes with Assist; Fluid consistency: Thin  Diet effective now                   Scheduled Meds:  atorvastatin   80 mg Per Tube QHS   calcium  gluconate  2 g Intravenous Once   Chlorhexidine  Gluconate Cloth  6 each Topical Daily   enoxaparin  (LOVENOX ) injection  40 mg Subcutaneous Q24H   free water   75 mL Per Tube Q4H   gabapentin   100 mg Per Tube Q8H   heparin  sodium (porcine)  1,000 Units Intracatheter Once   insulin  aspart  0-9 Units Subcutaneous Q4H   insulin  glargine  15 Units Subcutaneous BID   levETIRAcetam   500 mg Intravenous Q12H   lidocaine   1 patch Transdermal Daily   nystatin   100,000 Units Topical TID   mouth rinse  15 mL Mouth Rinse 4 times per day   oxyCODONE   5 mg Oral TID   pantoprazole  (PROTONIX ) IV  40 mg Intravenous Q12H   polyethylene glycol  17 g Oral Daily   sodium chloride  flush  10-40 mL Intracatheter Q12H    PRN meds: acetaminophen , ALPRAZolam , artificial tears, diphenhydrAMINE , guaiFENesin , HYDROmorphone  (DILAUDID ) injection **OR** [DISCONTINUED]  HYDROmorphone  (DILAUDID ) injection, LORazepam , naLOXone  (NARCAN )  injection, ondansetron  **OR** ondansetron  (ZOFRAN ) IV, mouth rinse, oxyCODONE  **OR** oxyCODONE , prochlorperazine , senna-docusate, sodium chloride  flush   Infusions:   citrate dextrose      feeding supplement (GLUCERNA 1.5 CAL) 1,000 mL (09/02/24 1239)    Antimicrobials: Anti-infectives (From admission, onward)    Start     Dose/Rate Route Frequency Ordered Stop   08/16/24 1100  cefTRIAXone  (ROCEPHIN ) 2 g in sodium chloride  0.9 % 100 mL IVPB        2 g 200 mL/hr over 30 Minutes Intravenous Every 24 hours 08/16/24 1001 08/20/24 1214   07/23/24 0600  vancomycin  (  VANCOCIN ) IVPB 1000 mg/200 mL premix  Status:  Discontinued        1,000 mg 200 mL/hr over 60 Minutes Intravenous  Every 12 hours 07/22/24 1834 07/24/24 1808   07/22/24 2200  meropenem  (MERREM ) 2 g in sodium chloride  0.9 % 100 mL IVPB  Status:  Discontinued        2 g 280 mL/hr over 30 Minutes Intravenous Every 8 hours 07/22/24 1601 07/24/24 1808   07/22/24 1700  vancomycin  (VANCOREADY) IVPB 1500 mg/300 mL        1,500 mg 150 mL/hr over 120 Minutes Intravenous  Once 07/22/24 1601 07/22/24 2031   07/22/24 1645  ampicillin  (OMNIPEN) 2 g in sodium chloride  0.9 % 100 mL IVPB  Status:  Discontinued        2 g 300 mL/hr over 20 Minutes Intravenous Every 6 hours 07/22/24 1557 07/24/24 1800   07/19/24 0530  meropenem  (MERREM ) 1 g in sodium chloride  0.9 % 100 mL IVPB  Status:  Discontinued        1 g 200 mL/hr over 30 Minutes Intravenous Every 8 hours 07/19/24 0438 07/22/24 1601   07/16/24 0900  ceFEPIme  (MAXIPIME ) 2 g in sodium chloride  0.9 % 100 mL IVPB  Status:  Discontinued        2 g 200 mL/hr over 30 Minutes Intravenous Every 8 hours 07/16/24 0811 07/19/24 0409   07/16/24 0815  ceFEPIme  (MAXIPIME ) 2 g in sodium chloride  0.9 % 100 mL IVPB  Status:  Discontinued        2 g 200 mL/hr over 30 Minutes Intravenous  Once 07/16/24 0803 07/16/24 0811   07/16/24 0330  cefTRIAXone  (ROCEPHIN ) 2 g in sodium chloride  0.9 % 100 mL IVPB        2 g 200 mL/hr over 30 Minutes Intravenous  Once 07/16/24 0315 07/16/24 0412       Objective: Vitals:   09/03/24 0950 09/03/24 1109  BP: (!) 164/73 (!) 160/74  Pulse: 94 91  Resp:  18  Temp:  98.7 F (37.1 C)  SpO2:  100%    Intake/Output Summary (Last 24 hours) at 09/03/2024 1357 Last data filed at 09/03/2024 1215 Gross per 24 hour  Intake 460 ml  Output 1375 ml  Net -915 ml   Filed Weights   08/27/24 0500 08/29/24 0853 08/31/24 0500  Weight: 87.2 kg 92.1 kg 95.3 kg   Weight change:  Body mass index is 38.41 kg/m.   Physical Exam: General exam: Pleasant, middle-aged female, looks older for age Skin: No rashes, lesions or ulcers. HEENT: Atraumatic,  normocephalic, no obvious bleeding Lungs: Clear to auscultation bilaterally,  CVS: S1, S2, no murmur,   GI/Abd: Soft, nontender, nondistended, bowel sound present,   CNS: Tries to open eyes, but mostly talks with eyes closed.  Oriented to self and place only.  Unable to follow motor commands Extremities: No pedal edema, no calf tenderness,   Data Review: I have personally reviewed the laboratory data and studies available.  F/u labs ordered Unresulted Labs (From admission, onward)     Start     Ordered   08/05/24 0500  CBC  (enoxaparin  (LOVENOX ))  Every 7 days,   R     Question:  Specimen collection method  Answer:  Unit=Unit collect   08/04/24 0735   Signed and Held  Basic metabolic panel  Once,   R       Comments: Prior to TPE   Question:  Specimen collection method  Answer:  Unit=Unit collect   Signed and Held   Signed and Held  Basic metabolic panel  ONCE - STAT,   R       Comments: Prior to TPE   Question:  Specimen collection method  Answer:  Unit=Unit collect   Signed and Held            Signed, Chapman Rota, MD [facility] 09/03/2024  "

## 2024-09-04 DIAGNOSIS — G9341 Metabolic encephalopathy: Secondary | ICD-10-CM | POA: Diagnosis not present

## 2024-09-04 DIAGNOSIS — N390 Urinary tract infection, site not specified: Secondary | ICD-10-CM | POA: Diagnosis not present

## 2024-09-04 DIAGNOSIS — Z515 Encounter for palliative care: Secondary | ICD-10-CM | POA: Diagnosis not present

## 2024-09-04 DIAGNOSIS — Z7189 Other specified counseling: Secondary | ICD-10-CM | POA: Diagnosis not present

## 2024-09-04 LAB — GLUCOSE, CAPILLARY
Glucose-Capillary: 186 mg/dL — ABNORMAL HIGH (ref 70–99)
Glucose-Capillary: 210 mg/dL — ABNORMAL HIGH (ref 70–99)
Glucose-Capillary: 248 mg/dL — ABNORMAL HIGH (ref 70–99)
Glucose-Capillary: 266 mg/dL — ABNORMAL HIGH (ref 70–99)
Glucose-Capillary: 277 mg/dL — ABNORMAL HIGH (ref 70–99)
Glucose-Capillary: 242 mg/dL — ABNORMAL HIGH (ref 70–99)

## 2024-09-04 MED ORDER — GABAPENTIN 250 MG/5ML PO SOLN
100.0000 mg | Freq: Two times a day (BID) | ORAL | Status: DC
  Administered 2024-09-04 – 2024-09-24 (×38): 100 mg
  Filled 2024-09-04 (×43): qty 2

## 2024-09-04 NOTE — Plan of Care (Signed)
" °  Problem: Tissue Perfusion: Goal: Adequacy of tissue perfusion will improve Outcome: Progressing   Problem: Health Behavior/Discharge Planning: Goal: Ability to manage health-related needs will improve Outcome: Not Progressing   Problem: Education: Goal: Knowledge of General Education information will improve Description: Including pain rating scale, medication(s)/side effects and non-pharmacologic comfort measures Outcome: Not Progressing   Problem: Health Behavior/Discharge Planning: Goal: Ability to manage health-related needs will improve Outcome: Not Progressing   "

## 2024-09-04 NOTE — Progress Notes (Signed)
 " Daily Progress Note   Date: 09/04/2024   Patient Name: Brenda [name]  DOB: 25-Aug-1961  MRN: [id number]  Age / Sex: 65 y.o., female  Attending Physician: Arlice Reichert, [name] Primary Care Physician: [facility] Admit Date: 07/15/2024 Length of Stay: 49 days  Reason for Follow-up: Establishing goals of care  Assessment & Plan:   HPI/Patient Profile:   64 y.o. 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   Palliative medicine consulted for goals of care conversation.  SUMMARY OF RECOMMENDATIONS Goals of care are clear at this time for life-prolonging measures and pursuit of further treatment/rehab Patient and family agreeable to PEG placement, discussed with [name] consulted for outpatient palliative care referral Symptom management adjusted as below Psychosocial and emotional support provided PMT will continue to follow and support  Symptom Management:  Oxycodone  5 mg Q8 for pain Oxycodone  2.5-5 mg Q4 PRN for breakthrough pain Ativan  IV Q4 PRN for agitation Dilaudid  IV Q4 PRN for severe pain Xanax  0.25 mg TID PRN for anxiety Continue trazodone  as needed at bedtime for sleep Decrease frequency of gabapentin  100 mg twice daily  Code Status: DNR - Limited (DNR/DNI)  Prognosis: Unable to determine  Discharge Planning: Skilled Nursing Facility for rehab with Palliative care service follow-up  Discussed with: Patient, patient's sister, Brenda [name], patient's husband  Subjective:   Subjective: Medical records reviewed including progress notes, labs, imaging, MAR.  Patient has had 2 doses each of IV Dilaudid  and Ativan  in the past 24 hours.  She has had 5 as needed doses of oxycodone  in the past 24 hours in addition to her 3 scheduled doses.  Patient assessed at the bedside.  She is alert  and conversational, though little confused.  Today's Discussion:  Patient husband was present at the bedside as I explored patient's thoughts and feelings on her current illness and emphasized importance of decision for her long-term nutritional needs.  Reviewed options with them both, including removing cortrak and monitoring her intake before deciding on PEG versus going ahead with PEG placement.  Patient's husband called patient's sister to participate in the conversation and I reviewed with her as well.  Patient is in agreement with PEG and does not have a preference for whether she tries to eat on her own before PEG placement.  Sister and husband agree with PEG placement as soon as possible, sharing they are concerned that they do not want her to lose any progress that she has made if her nutrition declines without artificial feeding.  We reviewed the process beginning with IR consult and next steps from there based on scheduling.  Patient's goal is to spend more time with her grandchildren.  She shares some awareness of the option of hospice.  She and her husband are agreeable to outpatient palliative care follow-up after review of the differences between services.  She makes a couple of comments about shooting her rather than other ways of dying, but also wishes to participate in rehab and husband feels this is likely her attempt at humor.  Objective:   Primary Diagnoses: Present on Admission:  UTI (urinary tract infection)  Chronic hypoxic respiratory failure (HCC)  Anxiety with depression  CAD (coronary artery disease)  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  DNR (do not resuscitate)  Endometrial carcinoma (HCC)  Obstructive sleep apnea  Acute metabolic encephalopathy  Vital Signs:  BP 135/67 (BP Location: Right Wrist)   Pulse 81   Temp 98.7 F (37.1 C) (Axillary)   Resp 17   Ht 5' 2 (1.575 m)   Wt 92.5 kg   LMP 10/30/2014   SpO2 100%   BMI 37.31 kg/m    Physical Exam Vitals and nursing note reviewed.  Constitutional:      Appearance: She is ill-appearing.     Comments: Somnolent but awake to voice.   HENT:     Head: Normocephalic and atraumatic.     Nose:     Comments: Cortrak in place.  Cardiovascular:     Rate and Rhythm: Normal rate.  Pulmonary:     Effort: Pulmonary effort is normal.  Skin:    General: Skin is warm and dry.  Neurological:     Mental Status: She is confused.  Psychiatric:        Mood and Affect: Mood normal.        Speech: Speech normal.    Palliative Assessment/Data: 30% (tube feeds)   Existing Vynca/ACP Documentation: None  Thank you for allowing us  to participate in the care of Brenda [name] PMT will continue to support holistically.  I personally spent a total of 50 minutes in the care of the patient today including preparing to see the patient, getting/reviewing separately obtained history, performing a medically appropriate exam/evaluation, referring and communicating with other health care professionals, and documenting clinical information in the EHR.   Dorothye Berni P Onesti Bonfiglio, PA-C  Palliative Medicine Team  Team Phone # [phone] (Nights/Weekends) 09/04/2024 9:24 AM  "

## 2024-09-04 NOTE — Progress Notes (Addendum)
 IP PROGRESS NOTE  Subjective:   Brenda [name] was asleep when I saw her at approximately 6:30 AM.  Her sister was at the bedside.  Her sister and the bedside RN reports she had a good night of sleep.  Objective: Vital signs in last 24 hours: Blood pressure 135/67, pulse 81, temperature 98.7 F (37.1 C), temperature source Axillary, resp. rate 17, height 5' 2 (1.575 m), weight 204 lb (92.5 kg), last menstrual period 10/30/2014, SpO2 100%.  Intake/Output from previous day: 01/01 0701 - 01/02 0700 In: 475 [P.O.:15; I.V.:10; NG/GT:450] Out: 750 [Urine:750]  Physical Exam: Arousable, appears comfortable, denies pain, no complaint Lungs: Distant breath sounds, no respiratory distress Cardiac: Regular rate and rhythm Neurologic: Speaking, follows commands, opens mouth, weak bilateral grip strength, moves toes, oriented to the hospital  abdomen: Soft, no mass, nontender Musculoskeletal: No pain with movement of either arm or palpation of feet  Portacath/PICC-without erythema  Lab Results: Recent Labs    09/02/24 0332  WBC 4.4  HGB 7.9*  HCT 25.1*  PLT 210    BMET No results for input(s): NA, K, CL, CO2, GLUCOSE, BUN, CREATININE, CALCIUM  in the last 72 hours.    Medications: I have reviewed the patient's current medications.  Assessment/Plan:  Endometrial cancer Diagnosed in 2023, status post surgery and radiation Recurrent disease July 2025 with a left retroperitoneal mass, biopsy confirmed adenocarcinoma, ER positive, MSI-high, tumor mutation burden 24, PD-L1 75%, PIK 3CA mutated, PTEN mutated, HER2 1+, loss of MLH1 expression 04/03/2024 CT chest: Spiculated 9 x 12 mm right upper lobe and part solid 4 x 6 mm right upper lobe nodules Cycle 1 paclitaxel /carboplatin /pembrolizumab  05/01/2024 Cycle 2 paclitaxel /carboplatin /pembrolizumab  06/30/2024 06/19/2024 CT abdomen/pelvis: Right lower lobe pulmonary embolism, splenic infarct, stable size of the left psoas mass  with evidence of erosive change at the anterior aspect of L2, decreased size of left external iliac node 08/09/2024 CTs: New psoas muscle mass (tumor versus hematoma), left psoas/paraspinal mass has decreased in size, by my review the previously noted lung nodules have resolved 08/18/2024 CT angiogram abdomen/pelvis-no evidence of active GI bleed, right psoas hematoma, L2 sclerotic change with left paraspinal mass  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  DVT 06/02/2024 4.  Pulmonary embolism noted on CT chest 06/19/2024 5.  Diabetes 6.  CHF/CAD 7.  Seizures, cefepime  toxicity?  November 2025 8.  Progressive generalized loss of motor function, altered mental status-related to CVAs?,  Paraneoplastic?  Toxicity from immunotherapy? 08/11/2024-Solu-Medrol  daily x 3 Therapy plasma exchange 08/21/2024, 08/23/2024, 08/25/2024, 08/27/2024, 08/29/2024 9.  History of respiratory failure 10.  Pain secondary to #1 11.  Anemia-stable 12.  History of elevated liver enzymes, normal 08/30/2024  Brenda [name] has a history of endometrial cancer initially diagnosed in 2020.  She was diagnosed with recurrent disease involving a left retroperitoneal mass in July 2025.  She completed 2 treatments with paclitaxel /carboplatin /pembrolizumab .  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  She completed 2 cycles of systemic therapy for treatment of the uterine cancer.  I reviewed the 08/09/2024 restaging CT findings with Brenda [name] and her daughter.  I reviewed the 08/18/2024 CT angiogram abdomen/pelvis.  The known malignancy at the left retroperitoneum has improved significantly.  The significance of the right psoas  mass  appears to represent a hematoma.      The etiology of the diffuse generalized weakness remains unclear.  There is likely a component related to the embolic CVAs and deconditioning, but the evolution of the loss of motor function over the past several weeks is more severe than expected.  She has undergone an extensive neurologic evaluation over the past month.  Neurology is following her in the hospital..  Neurology feels her symptoms may be related to PD-1 inhibitor neurotoxicity, a paraneoplastic syndrome, or another inflammatory process.  She started high-dose Solu-Medrol  on 08/11/2024, given daily for 3 days.  Her neurologic status did not improve.  She began plasma exchange on 08/21/2024.  She completed treatment #5 on 08/29/2024.  Her neurologic status has partially improved.   The etiology of her pain is unclear based on the restaging CT.  The retroperitoneal mass is much smaller.  She could be having pain from the right psoas lesion or another etiology.  There is no clear evidence for progression of the metastatic carcinoma.  I recommend attempting to wean the CNS acting agents including narcotics and lorazepam .  She appeared comfortable and denied pain when I saw her this morning.  She has persistent anemia.  The anemia is multifactorial including components related to bleeding, phlebotomy, and chronic disease.  I discussed the case with her sister at the bedside this morning.  We will check to see if she has undergone germline testing based on the loss of MLH1 expression in the tumor.  Recommendations: Wean narcoticvs, gabapentin ?,  And anxiolytic regimen as tolerated  Evaluation/management of neurologic status per neurology Anticoagulation resumed with prophylactic dose Lovenox  08/31/2024 tube feedings, follow-up with speech pathology-advance diet as tolerated Systemic treatment for the uterine cancer will remain on hold Continue goals of care discussions by palliative care and the  medical service Please call oncology as needed, I will be out until 09/12/2023.  Dr. Timmy will check on her next week.           LOS: 49 days   Arley Hof, MD   09/04/2024, 7:51 AM

## 2024-09-04 NOTE — Progress Notes (Signed)
 " PROGRESS NOTE  Brenda [name]  DOB: 05/21/1961  PCP: [facility] FMW:[id number]  DOA: 07/15/2024  LOS: 49 days  Hospital Day: 52  Subjective: Patient was seen and examined this morning. Lying down in bed.  Her sister was at bedside. Patient was apparently opening her eyes.  Mostly talking with eyes closed.  Oriented to place and person today.  States she wants to go home. Afebrile, hemodynamically stable, breathing on 2 L oxygen  Has very minimal oral intake and is dependent on Cortrak tube feeding Blood sugar level has been consistently less than 200 except this morning it is 210  Brief narrative: Brenda [name] is a 64 y.o. female with PMH significant for metastatic endometrial cancer, chronic cancer-related pain, DM2, HTN, HLD, obesity, OSA, CAD/stent, CHF, CVA, h/o DVT and PE anticoagulated on Lovenox , chronic hypoxic respiratory failure on 2L via nasal cannula. Recently hospitalized 11/9 -11/11 for UTI, treated with IV antibiotics and discharged on oral antibiotics. Next day 11/12, patient presented to [facility] ED with chills and weakness.   She was found to have UTI and was started on IV cefepime .   Unfortunately mental status worsened  11/15, patient was noted to have a stiffening of upper extremities, she was transferred to [facility] for continuous EEG monitoring. 11/16, she was noted to have seizures and video EEG.  Started on Keppra . Meningitis/encephalitis was suspected and patient was started on empiric blissful antibiotics.  CSF panel unremarkable and hence antibiotics were stopped  12/7, because of development of rigidity, neurology and oncology were reconsulted.  Palliative care consulted as well for cancer-related pain   Assessment and plan: Seizures Workup as above.  Currently on Keppra . Seizure precautions  Acute metabolic encephalopathy  Generalized rigidity Patient had significant altered mental status and generalized rigidity.  Multiple possible etiologies were considered.   Neurology and oncology consult appreciated.  Cefepime  was discontinued.  Prozac  was discontinued.  EEG did not show epileptiform abnormalities.  CT head without acute intracranial abnormality.  MRI brain showed evolving infarcts  MRI C spine did not show any evidence of metastatic disease.  Other possible etiologies considered were - neurotoxicity from Keytruda  which was being used to treat her cancer.  Keytruda  was stopped.  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 S/p high dose steroids 12/9-12/11.  S/p trial sinimet.  S/p baclofen .   S/p 5 days of PLEX, completed on 12/28  Per neurology note from 12/29, likely favor an autoimmune process.  Mental status also influenced probably by pain regimen as well as hospital induced delirium.   Probably had autoimmune nature to her stiffness and some of her mental status alteration. Overall, patient's mental status seems to be gradually improving.  Neurology has signed off. Continue delirium precautions  Pain regimen adjusted per palliative care as below 12/31, I discussed her care with oncology Dr. Cloretta as well as palliative.  Dr. Cloretta subsequently called patient's husband who is hopeful that patient will continue to recover and be a SNF candidate.  Currently she has tube feeding ongoing Cortrak, needs it removed or changed to PEG prior to considering SNF. 1/1, followed up by palliative care.  Husband wants to continue aggressive care but also not ready for PEG tube yet.   Metastatic Endometrial Cancer Cancer Related Pain  Seen by oncology and palliative care. She had had multiple imagings done during this hospitalization.  Per oncology note, there is no clear evidence of progression of the metastatic carcinoma.  Keytruda  on hold as explained above. Pain regimen --- Scheduled: Oxycodone  5 mg every 8 hours.  --- PRN: IV Dilaudid ,  oxycodone , Tylenol     Recent DVT/PE,Splenic Infarct In October 2025, patient was diagnosed to have right leg DVT, PE as well as a splenic infarct and she was started on anticoagulation.   This hospitalization, anticoagulation was held due to psoas hematoma and GI bleed.   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   Patient had BRBPR during this hospitalization BRBPR resolved at this time CT GI bleed study did not show any evidence GI bleed. Patient was given 1 unit of PRBC transfusion on 12/1.  Also given IV Venofer  Hemoglobin currently between 7 and 8 for last few days.   Anticoagulation on hold. On DVT prophylaxis with Lovenox  subcu Recent Labs    03/29/24 1512 03/30/24 0531 04/21/24 1541 04/22/24 0316 07/19/24 0245 07/20/24 1120 08/03/24 0319 08/04/24 0555 08/27/24 1443 08/29/24 0137 08/30/24 2105 08/31/24 0650 09/02/24 0332  HGB  --    < >  --    < > 10.8*   < > 6.8*   < > 8.8*  9.2* 8.5* 7.5* 7.5* 7.9*  MCV  --    < >  --    < > 83.1   < > 86.2   < > 96.9 95.3 97.5 96.3 97.7  VITAMINB12 487  --  764  --  512  --   --   --   --   --   --   --   --   FOLATE 10.2  --   --   --  17.7  --   --   --   --   --   --   --   --   FERRITIN 157  --   --   --   --   --  100  --   --   --   --   --   --   TIBC 224*  --   --   --   --   --  231*  --   --   --   --   --   --   IRON  20*  --   --   --   --   --  29  --   --   --   --   --   --   RETICCTPCT 1.7  --   --   --   --   --   --   --   --   --   --   --   --    < > = values in this interval not displayed.   Klebsiella Pneumoniae UTI Completed 5-day course of antibiotics   Dysphagia Moderate malnutrition With altered mental status, patient was not able to to take oral intake for several days.  She is now eating some but very minimal and not enough to sustain her nutrition.  She has been receiving tube feeds through [employer] therapist and dietitian following.   Eventually decision regarding PEG will need to be made.  1/1, husband said he is not ready for it yet.   Type 2 diabetes mellitus A1c 7.2 on 06/01/2024 Currently on Lantus  15 units twice daily, insulin  aspart SSI every 4 hours  Continue to monitor Recent Labs  Lab 09/03/24 1929 09/03/24 2320 09/04/24 0346 09/04/24 0804 09/04/24 1141  GLUCAP 168* 196* 186* 210* 248*   Hypertension 1/1, held previously ordered midodrine 5 mg twice daily and propranolol  10 mg 3 times daily  Blood pressure target less than 150 systolic.  Continue to monitor  Depression/anxiety Prozac  was discontinued due to rigidity   H/o CAD, Stroke HLD Currently on Lipitor  80 mg daily.  Anticoagulation on hold as discussed above  Abnormal Thyroid  Function Tests It seems early in the course of hospitalization, TSH level was low and free T4 level was elevated at 1.41 on 11/16 Patient was started on propranolol  20 mg 3 times daily.  Subsequent repeat TSH level normal at 0.9 on 12/7 Propranolol  has been intermittently held because of low blood pressure.  1/1, I stopped propranolol . Recent Labs    04/21/24 1541 04/23/24 1330 04/27/24 1438 07/19/24 0245 07/22/24 0833 08/09/24 1221  TSH 1.692 2.314 3.990 0.230* 0.315* 1.556     Nutrition Status: Nutrition Problem: Moderate Malnutrition Etiology: chronic illness (cancer) Signs/Symptoms: percent weight loss, mild muscle depletion, moderate muscle depletion (12% x 6 months) Percent weight loss: 12 % Interventions: Prostat, Tube feeding, MVI   PT Orders:   PT Follow up Rec: Skilled Nursing-Short Term Rehab (<3 Hours/Day)08/11/2024 1524    Goals of care   Code Status: Limited: Do not attempt resuscitation (DNR) -DNR-LIMITED -Do Not Intubate/DNI   Goals of Care Palliative following.   DVT prophylaxis:  enoxaparin  (LOVENOX ) injection 40 mg Start: 08/29/24 0900 Place and maintain sequential compression device Start: 08/02/24 9261   Antimicrobials: None  currently Fluid: Tube feeding Consultants: Neurology, palliative care Family Communication: Sister at bedside  Status: Inpatient Level of care:  Progressive   Patient is from: Home Needs to continue in-hospital care: Mental status remains poor Anticipated d/c to: Unclear at this time      Diet:  Diet Order             Diet regular Room service appropriate? Yes with Assist; Fluid consistency: Thin  Diet effective now                   Scheduled Meds:  atorvastatin   80 mg Per Tube QHS   calcium  gluconate  2 g Intravenous Once   Chlorhexidine  Gluconate Cloth  6 each Topical Daily   enoxaparin  (LOVENOX ) injection  40 mg Subcutaneous Q24H   free water   75 mL Per Tube Q4H   gabapentin   100 mg Per Tube BID   heparin  sodium (porcine)  1,000 Units Intracatheter Once   insulin  aspart  0-9 Units Subcutaneous Q4H   insulin  glargine  15 Units Subcutaneous BID   levETIRAcetam   500 mg Intravenous Q12H   lidocaine   1 patch Transdermal Daily   nystatin   100,000 Units Topical TID   mouth rinse  15 mL Mouth Rinse 4 times per day   oxyCODONE   5 mg Oral TID   pantoprazole  (PROTONIX ) IV  40 mg Intravenous Q12H   polyethylene glycol  17 g Oral Daily   sodium chloride  flush  10-40 mL Intracatheter Q12H    PRN meds: acetaminophen , ALPRAZolam , artificial tears, diphenhydrAMINE , guaiFENesin , HYDROmorphone  (DILAUDID ) injection **OR** [DISCONTINUED]  HYDROmorphone  (DILAUDID ) injection, LORazepam , naLOXone  (NARCAN )  injection, ondansetron  **OR** ondansetron  (ZOFRAN ) IV, mouth rinse, oxyCODONE  **OR** oxyCODONE , prochlorperazine , senna-docusate, sodium chloride  flush, traZODone    Infusions:   citrate dextrose      feeding supplement (GLUCERNA 1.5 CAL) 1,000 mL (09/03/24 1329)    Antimicrobials: Anti-infectives (From admission, onward)    Start     Dose/Rate Route Frequency Ordered Stop  08/16/24 1100  cefTRIAXone  (ROCEPHIN ) 2 g in sodium chloride  0.9 % 100 mL IVPB        2 g 200 mL/hr  over 30 Minutes Intravenous Every 24 hours 08/16/24 1001 08/20/24 1214   07/23/24 0600  vancomycin  (VANCOCIN ) IVPB 1000 mg/200 mL premix  Status:  Discontinued        1,000 mg 200 mL/hr over 60 Minutes Intravenous Every 12 hours 07/22/24 1834 07/24/24 1808   07/22/24 2200  meropenem  (MERREM ) 2 g in sodium chloride  0.9 % 100 mL IVPB  Status:  Discontinued        2 g 280 mL/hr over 30 Minutes Intravenous Every 8 hours 07/22/24 1601 07/24/24 1808   07/22/24 1700  vancomycin  (VANCOREADY) IVPB 1500 mg/300 mL        1,500 mg 150 mL/hr over 120 Minutes Intravenous  Once 07/22/24 1601 07/22/24 2031   07/22/24 1645  ampicillin  (OMNIPEN) 2 g in sodium chloride  0.9 % 100 mL IVPB  Status:  Discontinued        2 g 300 mL/hr over 20 Minutes Intravenous Every 6 hours 07/22/24 1557 07/24/24 1800   07/19/24 0530  meropenem  (MERREM ) 1 g in sodium chloride  0.9 % 100 mL IVPB  Status:  Discontinued        1 g 200 mL/hr over 30 Minutes Intravenous Every 8 hours 07/19/24 0438 07/22/24 1601   07/16/24 0900  ceFEPIme  (MAXIPIME ) 2 g in sodium chloride  0.9 % 100 mL IVPB  Status:  Discontinued        2 g 200 mL/hr over 30 Minutes Intravenous Every 8 hours 07/16/24 0811 07/19/24 0409   07/16/24 0815  ceFEPIme  (MAXIPIME ) 2 g in sodium chloride  0.9 % 100 mL IVPB  Status:  Discontinued        2 g 200 mL/hr over 30 Minutes Intravenous  Once 07/16/24 0803 07/16/24 0811   07/16/24 0330  cefTRIAXone  (ROCEPHIN ) 2 g in sodium chloride  0.9 % 100 mL IVPB        2 g 200 mL/hr over 30 Minutes Intravenous  Once 07/16/24 0315 07/16/24 0412       Objective: Vitals:   09/04/24 0737 09/04/24 1142  BP: 135/67 (!) 146/67  Pulse: 81 89  Resp: 17 20  Temp: 98.7 F (37.1 C) 98.5 F (36.9 C)  SpO2: 100% 100%    Intake/Output Summary (Last 24 hours) at 09/04/2024 1420 Last data filed at 09/04/2024 1124 Gross per 24 hour  Intake 465 ml  Output 1400 ml  Net -935 ml   Filed Weights   08/29/24 0853 08/31/24 0500 09/04/24 0500   Weight: 92.1 kg 95.3 kg 92.5 kg   Weight change:  Body mass index is 37.31 kg/m.   Physical Exam: General exam: Pleasant, middle-aged female, looks older for age.  Coretrak tube feeding ongoing. Skin: No rashes, lesions or ulcers. HEENT: Atraumatic, normocephalic, no obvious bleeding Lungs: Clear to auscultation bilaterally,  CVS: S1, S2, no murmur,   GI/Abd: Soft, nontender, nondistended, bowel sound present,   CNS: Tries to open eyes, but mostly talks with eyes closed.  Oriented to self and place only.  Tries to follow motor commands but very weak. Extremities: No pedal edema, no calf tenderness,   Data Review: I have personally reviewed the laboratory data and studies available.  F/u labs ordered Unresulted Labs (From admission, onward)     Start     Ordered   08/05/24 0500  CBC  (enoxaparin  (LOVENOX ))  Every 7 days,  R     Question:  Specimen collection method  Answer:  Unit=Unit collect   08/04/24 0735   Signed and Held  Basic metabolic panel  Once,   R       Comments: Prior to TPE   Question:  Specimen collection method  Answer:  Unit=Unit collect   Signed and Held   Signed and Held  Basic metabolic panel  ONCE - STAT,   R       Comments: Prior to TPE   Question:  Specimen collection method  Answer:  Unit=Unit collect   Signed and Held            Signed, Chapman Rota, MD [facility] 09/04/2024  "

## 2024-09-05 DIAGNOSIS — G9341 Metabolic encephalopathy: Secondary | ICD-10-CM | POA: Diagnosis not present

## 2024-09-05 LAB — GLUCOSE, CAPILLARY
Glucose-Capillary: 172 mg/dL — ABNORMAL HIGH (ref 70–99)
Glucose-Capillary: 149 mg/dL — ABNORMAL HIGH (ref 70–99)
Glucose-Capillary: 151 mg/dL — ABNORMAL HIGH (ref 70–99)
Glucose-Capillary: 146 mg/dL — ABNORMAL HIGH (ref 70–99)
Glucose-Capillary: 142 mg/dL — ABNORMAL HIGH (ref 70–99)
Glucose-Capillary: 101 mg/dL — ABNORMAL HIGH (ref 70–99)
Glucose-Capillary: 109 mg/dL — ABNORMAL HIGH (ref 70–99)

## 2024-09-05 MED ORDER — FLUOXETINE HCL 10 MG PO CAPS
10.0000 mg | ORAL_CAPSULE | Freq: Every day | ORAL | Status: DC
  Administered 2024-09-05: 10 mg via ORAL
  Filled 2024-09-05: qty 1

## 2024-09-05 MED ORDER — OXYCODONE HCL 5 MG PO TABS
5.0000 mg | ORAL_TABLET | Freq: Two times a day (BID) | ORAL | Status: DC
  Administered 2024-09-05 – 2024-09-07 (×5): 5 mg via ORAL
  Filled 2024-09-05 (×6): qty 1

## 2024-09-05 MED ORDER — FLUOXETINE HCL 10 MG PO CAPS: 10.0000 mg | ORAL_CAPSULE | Freq: Every day | ORAL | Status: DC

## 2024-09-05 MED ORDER — FLUOXETINE HCL 20 MG PO TABS: 10.0000 mg | ORAL_TABLET | Freq: Every day | ORAL | Status: DC

## 2024-09-05 NOTE — Plan of Care (Signed)
" °  Problem: Coping: Goal: Ability to adjust to condition or change in health will improve Outcome: Progressing   Problem: Fluid Volume: Goal: Ability to maintain a balanced intake and output will improve Outcome: Progressing   Problem: Health Behavior/Discharge Planning: Goal: Ability to manage health-related needs will improve Outcome: Progressing   Problem: Health Behavior/Discharge Planning: Goal: Ability to identify and utilize available resources and services will improve Outcome: Progressing   Problem: Metabolic: Goal: Ability to maintain appropriate glucose levels will improve Outcome: Progressing   Problem: Nutritional: Goal: Maintenance of adequate nutrition will improve Outcome: Progressing   Problem: Tissue Perfusion: Goal: Adequacy of tissue perfusion will improve Outcome: Progressing   Problem: Clinical Measurements: Goal: Ability to maintain clinical measurements within normal limits will improve Outcome: Progressing   Problem: Clinical Measurements: Goal: Will remain free from infection Outcome: Progressing   Problem: Clinical Measurements: Goal: Respiratory complications will improve Outcome: Progressing   Problem: Elimination: Goal: Will not experience complications related to bowel motility Outcome: Progressing   Problem: Elimination: Goal: Will not experience complications related to urinary retention Outcome: Progressing   Problem: Pain Managment: Goal: General experience of comfort will improve and/or be controlled Outcome: Progressing   Problem: Safety: Goal: Ability to remain free from injury will improve Outcome: Progressing   Problem: Skin Integrity: Goal: Risk for impaired skin integrity will decrease Outcome: Progressing   Problem: Education: Goal: Knowledge of General Education information will improve Description: Including pain rating scale, medication(s)/side effects and non-pharmacologic comfort measures Outcome:  Progressing   "

## 2024-09-05 NOTE — Plan of Care (Signed)
" °  Problem: Fluid Volume: Goal: Ability to maintain a balanced intake and output will improve Outcome: Progressing   Problem: Nutritional: Goal: Maintenance of adequate nutrition will improve Outcome: Progressing   Problem: Tissue Perfusion: Goal: Adequacy of tissue perfusion will improve Outcome: Progressing   Problem: Education: Goal: Ability to describe self-care measures that may prevent or decrease complications (Diabetes Survival Skills Education) will improve Outcome: Not Progressing   Problem: Education: Goal: Knowledge of General Education information will improve Description: Including pain rating scale, medication(s)/side effects and non-pharmacologic comfort measures Outcome: Not Progressing   "

## 2024-09-05 NOTE — Progress Notes (Signed)
 " PROGRESS NOTE  Brenda [name]  DOB: 1960/09/19  PCP: [facility] FMW:[id number]  DOA: 07/15/2024  LOS: 50 days  Hospital Day: 53  Subjective: Patient was seen and examined this morning. Lying down in bed. Barely tries to open eyes on command. Answers with eyes closed. Only uses few words. Multiple family members at bedside. Family asked to resume Prozac  today. It was stopped earlier in the course because of it being the potential cause of rigidity. Family is agreeable to PEG tube placement. Afebrile, hemodynamically stable, breathing on room air.  Brief narrative: Brenda [name] is a 64 y.o. female with PMH significant for metastatic endometrial cancer, chronic cancer-related pain, DM2, HTN, HLD, obesity, OSA, CAD/stent, CHF, CVA, h/o DVT and PE anticoagulated on Lovenox , chronic hypoxic respiratory failure on 2L via nasal cannula. Recently hospitalized 11/9 -11/11 for UTI, treated with IV antibiotics and discharged on oral antibiotics. Next day 11/12, patient presented to [facility] ED with chills and weakness.   She was found to have UTI and was started on IV cefepime .   Unfortunately mental status worsened  11/15, patient was noted to have a stiffening of upper extremities, she was transferred to [facility] for continuous EEG monitoring. 11/16, she was noted to have seizures and video EEG. She was started on Keppra . Meningitis/encephalitis was suspected and patient was started on empiric antibiotics.  CSF panel unremarkable and hence antibiotics were stopped  12/7, because of development of rigidity, neurology and oncology were reconsulted.  Palliative care consulted as well for cancer-related pain   Assessment and plan: Seizures Workup as above.  Currently on Keppra . Seizure precautions  Acute metabolic encephalopathy  Generalized rigidity Patient had significant altered mental status and generalized rigidity. Multiple possible etiologies were  considered.   Neurology and oncology consult appreciated.  Cefepime  was discontinued.  Prozac  was discontinued.  EEG did not show epileptiform abnormalities.  CT head without acute intracranial abnormality.  MRI brain showed evolving infarcts. MRI C spine did not show any evidence of metastatic disease.  Other possible etiologies considered were - neurotoxicity from Keytruda  which was being used to treat her cancer.  Keytruda  was stopped.  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 S/p high dose steroids 12/9-12/11.  S/p trial sinimet.  S/p baclofen .   S/p 5 days of PLEX, completed on 12/28  Per neurology note from 12/29, likely favor an autoimmune process.  Mental status also influenced probably by pain regimen as well as hospital induced delirium.   Overall, patient's mental status seems to be gradually improving.  Neurology has signed off. Oncology and palliative care following. Family wants to continue aggressive care. 1/2, has agreed to PEG tube placement. IR consulted.   Metastatic Endometrial Cancer Cancer Related Pain  Seen by oncology and palliative care. She had had multiple imagings done during this hospitalization.  Per oncology note, there is no clear evidence of progression of the metastatic carcinoma. Keytruda  on hold as explained above. Pain regimen --- Scheduled: Oxycodone  5 mg every 8 hours. Reduced to BID today. --- PRN: IV Dilaudid , oxycodone , Tylenol     Recent DVT/PE,Splenic Infarct In October 2025, patient was diagnosed to have right leg DVT, PE as well as a splenic infarct and she was started on anticoagulation.   This hospitalization, anticoagulation was held due to psoas hematoma and GI bleed.   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   Patient  had BRBPR during this hospitalization BRBPR resolved at this time CT GI bleed study did not show any  evidence GI bleed. Patient was given 1 unit of PRBC transfusion on 12/1.  Also given IV Venofer  Hemoglobin currently between 7 and 8 for last few days.   Anticoagulation on hold. On DVT prophylaxis with Lovenox  subcu Recent Labs    03/29/24 1512 03/30/24 0531 04/21/24 1541 04/22/24 0316 07/19/24 0245 07/20/24 1120 08/03/24 0319 08/04/24 0555 08/27/24 1443 08/29/24 0137 08/30/24 2105 08/31/24 0650 09/02/24 0332  HGB  --    < >  --    < > 10.8*   < > 6.8*   < > 8.8*  9.2* 8.5* 7.5* 7.5* 7.9*  MCV  --    < >  --    < > 83.1   < > 86.2   < > 96.9 95.3 97.5 96.3 97.7  VITAMINB12 487  --  764  --  512  --   --   --   --   --   --   --   --   FOLATE 10.2  --   --   --  17.7  --   --   --   --   --   --   --   --   FERRITIN 157  --   --   --   --   --  100  --   --   --   --   --   --   TIBC 224*  --   --   --   --   --  231*  --   --   --   --   --   --   IRON  20*  --   --   --   --   --  29  --   --   --   --   --   --   RETICCTPCT 1.7  --   --   --   --   --   --   --   --   --   --   --   --    < > = values in this interval not displayed.   Klebsiella Pneumoniae UTI Completed 5-day course of antibiotics   Dysphagia Moderate malnutrition With altered mental status, patient was not able to to take oral intake for several days.  She is now eating some but very minimal and not enough to sustain her nutrition.  She has been receiving tube feeds through [employer] therapist and dietitian following.  Eventually decision regarding PEG will need to be made.  1/1, husband said he is not ready for it yet.   Type 2 diabetes mellitus A1c 7.2 on 06/01/2024 Currently on Lantus  15 units twice daily, insulin  aspart SSI every 4 hours  Continue to monitor Recent Labs  Lab 09/04/24 2310 09/05/24 0355 09/05/24 0730 09/05/24 0935 09/05/24 1217  GLUCAP 242* 172* 149* 151* 146*   Hypertension 1/1, held previously ordered midodrine  5 mg twice daily and propranolol  10 mg 3 times daily   Blood pressure target less than 150 systolic.  Continue to monitor  Depression/anxiety Prozac  discontinued due to rigidity   H/o CAD, Stroke HLD Currently on Lipitor  80 mg daily.  Anticoagulation on hold as discussed above  Abnormal Thyroid  Function Tests It seems early in the course of hospitalization, TSH level was low and free T4 level was elevated at 1.41 on  11/16 Patient was started on propranolol  20 mg 3 times daily.  Subsequent repeat TSH level normal at 0.9 on 12/7 Propranolol  has been intermittently held because of low blood pressure.  1/1, I stopped propranolol . Recent Labs    04/21/24 1541 04/23/24 1330 04/27/24 1438 07/19/24 0245 07/22/24 0833 08/09/24 1221  TSH 1.692 2.314 3.990 0.230* 0.315* 1.556     Nutrition Status: Nutrition Problem: Moderate Malnutrition Etiology: chronic illness (cancer) Signs/Symptoms: percent weight loss, mild muscle depletion, moderate muscle depletion (12% x 6 months) Percent weight loss: 12 % Interventions: Prostat, Tube feeding, MVI   PT Orders:   PT Follow up Rec: Skilled Nursing-Short Term Rehab (<3 Hours/Day)09/05/2024 1300    Goals of care   Code Status: Limited: Do not attempt resuscitation (DNR) -DNR-LIMITED -Do Not Intubate/DNI   Goals of Care Palliative following.   DVT prophylaxis:  enoxaparin  (LOVENOX ) injection 40 mg Start: 08/29/24 0900 Place and maintain sequential compression device Start: 08/02/24 9261   Antimicrobials: None currently Fluid: Tube feeding Consultants: Neurology, palliative care Family Communication: multiple family members at bedside  Status: Inpatient Level of care:  Progressive   Patient is from: Home Needs to continue in-hospital care: Mental status remains poor Anticipated d/c to: Unclear at this time      Diet:  Diet Order             Diet regular Room service appropriate? Yes with Assist; Fluid consistency: Thin  Diet effective now                   Scheduled  Meds:  atorvastatin   80 mg Per Tube QHS   calcium  gluconate  2 g Intravenous Once   Chlorhexidine  Gluconate Cloth  6 each Topical Daily   enoxaparin  (LOVENOX ) injection  40 mg Subcutaneous Q24H   free water   75 mL Per Tube Q4H   gabapentin   100 mg Per Tube BID   heparin  sodium (porcine)  1,000 Units Intracatheter Once   insulin  aspart  0-9 Units Subcutaneous Q4H   insulin  glargine  15 Units Subcutaneous BID   levETIRAcetam   500 mg Intravenous Q12H   lidocaine   1 patch Transdermal Daily   nystatin   100,000 Units Topical TID   mouth rinse  15 mL Mouth Rinse 4 times per day   oxyCODONE   5 mg Oral BID   pantoprazole  (PROTONIX ) IV  40 mg Intravenous Q12H   polyethylene glycol  17 g Oral Daily   sodium chloride  flush  10-40 mL Intracatheter Q12H    PRN meds: acetaminophen , ALPRAZolam , artificial tears, diphenhydrAMINE , guaiFENesin , HYDROmorphone  (DILAUDID ) injection **OR** [DISCONTINUED]  HYDROmorphone  (DILAUDID ) injection, LORazepam , naLOXone  (NARCAN )  injection, ondansetron  **OR** ondansetron  (ZOFRAN ) IV, mouth rinse, oxyCODONE  **OR** oxyCODONE , prochlorperazine , senna-docusate, sodium chloride  flush, traZODone    Infusions:   citrate dextrose      feeding supplement (GLUCERNA 1.5 CAL) 1,000 mL (09/03/24 1329)    Antimicrobials: Anti-infectives (From admission, onward)    Start     Dose/Rate Route Frequency Ordered Stop   08/16/24 1100  cefTRIAXone  (ROCEPHIN ) 2 g in sodium chloride  0.9 % 100 mL IVPB        2 g 200 mL/hr over 30 Minutes Intravenous Every 24 hours 08/16/24 1001 08/20/24 1214   07/23/24 0600  vancomycin  (VANCOCIN ) IVPB 1000 mg/200 mL premix  Status:  Discontinued        1,000 mg 200 mL/hr over 60 Minutes Intravenous Every 12 hours 07/22/24 1834 07/24/24 1808   07/22/24 2200  meropenem  (MERREM ) 2 g in sodium chloride   0.9 % 100 mL IVPB  Status:  Discontinued        2 g 280 mL/hr over 30 Minutes Intravenous Every 8 hours 07/22/24 1601 07/24/24 1808   07/22/24 1700   vancomycin  (VANCOREADY) IVPB 1500 mg/300 mL        1,500 mg 150 mL/hr over 120 Minutes Intravenous  Once 07/22/24 1601 07/22/24 2031   07/22/24 1645  ampicillin  (OMNIPEN) 2 g in sodium chloride  0.9 % 100 mL IVPB  Status:  Discontinued        2 g 300 mL/hr over 20 Minutes Intravenous Every 6 hours 07/22/24 1557 07/24/24 1800   07/19/24 0530  meropenem  (MERREM ) 1 g in sodium chloride  0.9 % 100 mL IVPB  Status:  Discontinued        1 g 200 mL/hr over 30 Minutes Intravenous Every 8 hours 07/19/24 0438 07/22/24 1601   07/16/24 0900  ceFEPIme  (MAXIPIME ) 2 g in sodium chloride  0.9 % 100 mL IVPB  Status:  Discontinued        2 g 200 mL/hr over 30 Minutes Intravenous Every 8 hours 07/16/24 0811 07/19/24 0409   07/16/24 0815  ceFEPIme  (MAXIPIME ) 2 g in sodium chloride  0.9 % 100 mL IVPB  Status:  Discontinued        2 g 200 mL/hr over 30 Minutes Intravenous  Once 07/16/24 0803 07/16/24 0811   07/16/24 0330  cefTRIAXone  (ROCEPHIN ) 2 g in sodium chloride  0.9 % 100 mL IVPB        2 g 200 mL/hr over 30 Minutes Intravenous  Once 07/16/24 0315 07/16/24 0412       Objective: Vitals:   09/05/24 0731 09/05/24 1219  BP: 132/64 135/65  Pulse: 77 76  Resp: 16 16  Temp: 98.2 F (36.8 C) 98.6 F (37 C)  SpO2: 100% 100%    Intake/Output Summary (Last 24 hours) at 09/05/2024 1442 Last data filed at 09/05/2024 1238 Gross per 24 hour  Intake 460 ml  Output 1150 ml  Net -690 ml   Filed Weights   08/31/24 0500 09/04/24 0500 09/05/24 0500  Weight: 95.3 kg 92.5 kg 93 kg   Weight change: 0.454 kg Body mass index is 37.49 kg/m.   Physical Exam: General exam: Pleasant, middle-aged female, looks older for age.  C[place]rak tube feeding ongoing. Skin: No rashes, lesions or ulcers. HEENT: Atraumatic, normocephalic, no obvious bleeding Lungs: Clear to auscultation bilaterally,  CVS: S1, S2, no murmur,   GI/Abd: Soft, nontender, nondistended, bowel sound present,   CNS: Tries to open eyes, but mostly talks  with eyes closed.  Oriented to self and place only.  Tries to follow motor commands but very weak. Extremities: No pedal edema, no calf tenderness,   Data Review: I have personally reviewed the laboratory data and studies available.  F/u labs ordered Unresulted Labs (From admission, onward)     Start     Ordered   08/05/24 0500  CBC  (enoxaparin  (LOVENOX ))  Every 7 days,   R     Question:  Specimen collection method  Answer:  Unit=Unit collect   08/04/24 0735   Signed and Held  Basic metabolic panel  Once,   R       Comments: Prior to TPE   Question:  Specimen collection method  Answer:  Unit=Unit collect   Signed and Held   Signed and Held  Basic metabolic panel  ONCE - STAT,   R       Comments: Prior to TPE  Question:  Specimen collection method  Answer:  Unit=Unit collect   Signed and Held            Signed, Chapman Rota, MD [facility] 09/05/2024  "

## 2024-09-05 NOTE — Evaluation (Signed)
 Physical Therapy Evaluation Patient Details Name: Brenda [name] MRN: [id number] DOB: 1961-05-16 Today's Date: 09/05/2024  History of Present Illness  64 y.o. F admitted 07/15/24 for acute metabolic encephalopathy. Pt presented wtih ongoing s/sx of UTI, weakness, and generalized rigidity. Pt with 4 seizures on EEG 11/16, c/f cefepime  toxicity. C-spine imaging unremarkable for acute process. CSF PCR (-) meningitis/encephalitis. MRI brain 12/7 demonstrated multifocally evolving infarcts in the anterior and posterior circulation with developing encephalomalacia and resolving restricted diffusion. 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 Pt s/p high dose steroids 12/9-12/11; S/p trial sinimet; S/p baclofen ; S/p 5 days of PLEX, completed on 12/28. PMHx: HTN, HLD, T1DM, depression/anxiety, CVA, DVT/PE, OSA on CPAP, chronic respiratory failure (2L O2 at baseline), and metastatic endometrial cancer with chronic cancer-related pain. Of note, recent admission 11/9-11/11 for UTI, treated with IV antibiotics and d/c'd on oral antibiotics.   Clinical Impression  Pt admitted with above diagnosis. Prior to November 2025, pt was modI for functional mobility using a rollator in the household, she required assistance for community mobility using a w/c, and was relatively independent with BADLs. Pt currently with functional limitations due to the deficits listed below (see PT Problem List). She required totalA x2 to roll in bed. Pt was able to follow simple one step commands inconsistently. She maintained eyes closed throughout session, but was able to engage in conversation more consistently and appropriately. Pt with minimal active movement, R>L and significantly more in UEa compared to LEa. She has decreased rigidity/flexor tone per chart review. Pt is currently limited by impaired cognition, pain, generalized deconditioning, and impaired activity  tolerance. Educated family in positioning considerations to prevent skin breakdown over bony prominence and reviewed PROM exercises for BUE and BLE. Encouraged q2H turns/repositioning and continued use of PRAFOs. Discussed wear schedule. Demonstrated how to use pillows/blankets/towels to offload and float pt's extremities as well as achieve neutral cervical alignment. Pt will benefit from acute skilled PT to facilitate safe discharge, continue family education, and promote independence as best possible. Recommend continued inpatient follow up therapy, <3 hours/day.    If plan is discharge home, recommend the following: Two people to help with walking and/or transfers;Two people to help with bathing/dressing/bathroom;Assistance with feeding;Direct supervision/assist for medications management;Direct supervision/assist for financial management;Assist for transportation;Supervision due to cognitive status;Help with stairs or ramp for entrance   Can travel by private vehicle   No    Equipment Recommendations Hospital bed;Hoyer lift;Other (comment) Visual Merchandiser)  Recommendations for Other Services       Functional Status Assessment Patient has had a recent decline in their functional status and/or demonstrates limited ability to make significant improvements in function in a reasonable and predictable amount of time     Precautions / Restrictions Precautions Precautions: Fall;Other (comment) (Skin Integrity) Recall of Precautions/Restrictions: Impaired Precaution/Restrictions Comments: Cortrak Restrictions Weight Bearing Restrictions Per Provider Order: No      Mobility  Bed Mobility Overal bed mobility: Needs Assistance Bed Mobility: Rolling Rolling: Total assist, +2 for physical assistance         General bed mobility comments: Rolled pt R/L. Positioned unilateral LE into slight knee flex requiring assist to maintain position. Instructed pt to reach across her body to bed rail with  unilatearl UE with assist to guide her. Use of bed pad and totalA x2 to pivot pt into sidelying. Determined she was soiled. Completed pericare dependently. Repositioned using bed features, bed pad, and +2 assist. Deferred sitting EOB d/t  lethargy, fatigue, increasing pain, and pt being on an air mattress.    Transfers                   General transfer comment: Deferred. Plan to attempt transfer to recliner chair via maximove in future session.    Ambulation/Gait                  Stairs            Wheelchair Mobility     Tilt Bed    Modified Rankin (Stroke Patients Only)       Balance Overall balance assessment: Needs assistance (Session remained bed level. Plan to look at seated balance in recliner chair in future session.)                                           Pertinent Vitals/Pain Pain Assessment Pain Assessment: Faces Faces Pain Scale: Hurts little more Pain Location: R hip/knee with movement; R shoulder/elbow with movement Pain Descriptors / Indicators: Grimacing, Moaning Pain Intervention(s): Premedicated before session, Monitored during session, Limited activity within patient's tolerance, Repositioned    Home Living Family/patient expects to be discharged to:: Skilled nursing facility                        Prior Function Prior Level of Function : Needs assist;History of Falls (last six months)       Physical Assist : Mobility (physical);ADLs (physical) Mobility (physical): Stairs ADLs (physical): Bathing;Dressing;IADLs Mobility Comments: Since time of d/c from hospital (11/9) pt has been primarily bedbound, daughter indicated pt has been able to get OOB with heavy assist to w/c or recliner. She has been unable to safely navigate in home with rollator to the bathroom. Prior to November 2025: ambulated with use of rollator in home, w/c for community access with assist, having issues on ramp and for bed mobility  as well as w/c and rollator access into bathroom. ADLs Comments: Family reports pt requires extensive assist for all ADLs, and self care tasks. Prior to November 2025: relatively ModI prior for BADL's except showers and assist with all IADL's from family     Extremity/Trunk Assessment   Upper Extremity Assessment Upper Extremity Assessment: Difficult to assess due to impaired cognition;RUE deficits/detail;LUE deficits/detail RUE Deficits / Details: Minimal AROM noted. Pt required assistance to complete motion. PROM: Wrist/Hand WFL; Elbow extension WFL, decresed flex achieving ~90deg before pt c/o pain; Shoulder abd/flex to ~90deg before pt c/o pain. Significant weakness throughout RUE. She was able to form a slight fist with minimal grip, 1/5. Pt was able to keep her hand in the air while elbow was bent and in contact with bed. Elbow ext 3-/5. RUE: Unable to fully assess due to pain RUE Coordination: decreased fine motor;decreased gross motor LUE Deficits / Details: Minimal AROM noted. Pt required assistance to complete motion. PROM: Wrist/Hand WFL; Elbow extension WFL, decresed flex achieving ~90deg before pt c/o pain; Shoulder abd/flex to ~90deg before pt c/o pain. Significant weakness throughout LUE. She was able to form a slight fist with minimal grip, 1/5. Pt was able to keep her hand in the air while elbow was bent and in contact with bed. Elbow ext 2/5. LUE: Unable to fully assess due to pain LUE Coordination: decreased fine motor;decreased gross motor    Lower Extremity Assessment Lower Extremity Assessment:  Difficult to assess due to impaired cognition;RLE deficits/detail;LLE deficits/detail RLE Deficits / Details: Minimal AROM noted. Pt required assistance to complete all motion. She demonstrated ability to wiggle her toes and slightly move ankle up/down. PROM: Ankle PF WFL, decreased DF; Knee extension WFL, decreased flex achieving ~45deg before pt c/o pain; Hip decreased hip abd/add and  flex pt c/o pain quickly. RLE: Unable to fully assess due to pain RLE Coordination: decreased gross motor LLE Deficits / Details: Minimal AROM noted. Pt required assistance to complete all motion. She only demonstrated slight wiggling of her toes. PROM: Ankle PF WFL, decreased DF; Knee extension WFL, decreased flex achieving ~30deg before pt c/o pain; Hip decreased hip abd/add and flex pt c/o pain quickly. LLE: Unable to fully assess due to pain LLE Coordination: decreased gross motor    Cervical / Trunk Assessment Cervical / Trunk Assessment: Other exceptions Cervical / Trunk Exceptions: Neck laterally flexed and rotated towards the right. Increased Body Habitus.  Communication   Communication Communication: Impaired Factors Affecting Communication: Difficulty expressing self (Pt actively engaged in conversation, she had intermittent difficulty with word finding or being able to elaborate.)    Cognition Arousal: Lethargic Behavior During Therapy: Flat affect   PT - Cognitive impairments: Difficult to assess Difficult to assess due to: Level of arousal                     PT - Cognition Comments: Pt kept her eyes closed throughout session. She engaged in conversation. Pt stated her name. She responded yes and no to questions. Pt continues to be confused reporting her father died last week. Pt reported I am thinking and had difficulty elaborating stating where she was hurting. Pt unable to follow 2 step or multiple step commands. She intermittently followed simple 1 step commands, but frequently confused which side primarily UE was being asked to move. Pt voiced I don't think I can do that or that's enough. Following commands: Impaired Following commands impaired: Follows one step commands inconsistently, Follows one step commands with increased time     Cueing Cueing Techniques: Verbal cues, Tactile cues     General Comments General comments (skin integrity, edema,  etc.): VSS on 2L O2 via [place]. [name] sister, daughter, and other family member present and supportive throughout session. Educated family on PROM exercises they could perform to BUE and BLE. Discussed position considerations including elevation using pillows, offloading through turning, and wear schedule of PRAFOs.    Exercises General Exercises - Upper Extremity Shoulder Flexion: Supine, Both, PROM (3 reps) Shoulder ABduction: Supine, Both, PROM (3 reps) Elbow Flexion: Supine, Both, PROM (3 reps) Elbow Extension: Supine, Both, PROM (3 reps) Wrist Flexion: Supine, Both, PROM (3 reps) Digit Composite Flexion: Supine, Both, PROM (3 reps) General Exercises - Lower Extremity Ankle Circles/Pumps: Supine, Both, PROM (3 reps) Heel Slides: Supine, Both, PROM (3 reps) Hip ABduction/ADduction: Supine, Both, PROM (3 reps) Hip Flexion/Marching: Supine, Both, PROM (3 reps)   Assessment/Plan    PT Assessment Patient needs continued PT services  PT Problem List Decreased strength;Decreased activity tolerance;Decreased balance;Decreased mobility;Decreased knowledge of use of DME;Decreased range of motion;Decreased coordination;Decreased cognition;Decreased safety awareness;Decreased skin integrity;Obesity;Pain       PT Treatment Interventions DME instruction;Functional mobility training;Therapeutic activities;Therapeutic exercise;Balance training;Neuromuscular re-education;Cognitive remediation;Patient/family education;Wheelchair mobility training    PT Goals (Current goals can be found in the Care Plan section)  Acute Rehab PT Goals Patient Stated Goal: Get better PT Goal Formulation: With patient/family Time For Goal Achievement:  09/19/24 Potential to Achieve Goals: Fair    Frequency Min 1X/week     Co-evaluation               AM-PAC PT 6 Clicks Mobility  Outcome Measure Help needed turning from your back to your side while in a flat bed without using bedrails?: Total Help needed  moving from lying on your back to sitting on the side of a flat bed without using bedrails?: Total Help needed moving to and from a bed to a chair (including a wheelchair)?: Total Help needed standing up from a chair using your arms (e.g., wheelchair or bedside chair)?: Total Help needed to walk in hospital room?: Total Help needed climbing 3-5 steps with a railing? : Total 6 Click Score: 6    End of Session Equipment Utilized During Treatment: Oxygen  Activity Tolerance: Patient limited by lethargy;Patient limited by fatigue;Patient limited by pain Patient left: in bed;with call bell/phone within reach;with family/visitor present Nurse Communication: Mobility status;Need for lift equipment PT Visit Diagnosis: Other abnormalities of gait and mobility (R26.89);Muscle weakness (generalized) (M62.81);Unsteadiness on feet (R26.81);History of falling (Z91.81);Pain Pain - part of body:  (Generalized)    Time: 8864-8794 PT Time Calculation (min) (ACUTE ONLY): 30 min   Charges:   PT Evaluation $PT Eval Moderate Complexity: 1 Mod PT Treatments $Therapeutic Exercise: 8-22 mins PT General Charges $$ ACUTE PT VISIT: 1 Visit         Randall SAUNDERS, PT, DPT Acute Rehabilitation Services Office: [phone] Secure Chat Preferred  Delon CHRISTELLA Callander 09/05/2024, 2:13 PM

## 2024-09-06 DIAGNOSIS — G893 Neoplasm related pain (acute) (chronic): Secondary | ICD-10-CM | POA: Diagnosis not present

## 2024-09-06 DIAGNOSIS — E870 Hyperosmolality and hypernatremia: Secondary | ICD-10-CM | POA: Diagnosis not present

## 2024-09-06 DIAGNOSIS — J9611 Chronic respiratory failure with hypoxia: Secondary | ICD-10-CM | POA: Diagnosis not present

## 2024-09-06 DIAGNOSIS — R29898 Other symptoms and signs involving the musculoskeletal system: Secondary | ICD-10-CM

## 2024-09-06 DIAGNOSIS — G9341 Metabolic encephalopathy: Secondary | ICD-10-CM | POA: Diagnosis not present

## 2024-09-06 DIAGNOSIS — N390 Urinary tract infection, site not specified: Secondary | ICD-10-CM | POA: Diagnosis not present

## 2024-09-06 LAB — GLUCOSE, CAPILLARY
Glucose-Capillary: 119 mg/dL — ABNORMAL HIGH (ref 70–99)
Glucose-Capillary: 269 mg/dL — ABNORMAL HIGH (ref 70–99)
Glucose-Capillary: 230 mg/dL — ABNORMAL HIGH (ref 70–99)
Glucose-Capillary: 176 mg/dL — ABNORMAL HIGH (ref 70–99)
Glucose-Capillary: 167 mg/dL — ABNORMAL HIGH (ref 70–99)
Glucose-Capillary: 170 mg/dL — ABNORMAL HIGH (ref 70–99)

## 2024-09-06 MED ORDER — LOPERAMIDE HCL 2 MG PO CAPS
2.0000 mg | ORAL_CAPSULE | Freq: Once | ORAL | Status: AC
  Administered 2024-09-06: 2 mg via ORAL
  Filled 2024-09-06: qty 1

## 2024-09-06 MED ORDER — INSULIN GLARGINE-YFGN 100 UNIT/ML ~~LOC~~ SOLN
15.0000 [IU] | Freq: Two times a day (BID) | SUBCUTANEOUS | Status: DC
  Administered 2024-09-07 – 2024-09-14 (×14): 15 [IU] via SUBCUTANEOUS
  Filled 2024-09-06 (×16): qty 0.15

## 2024-09-06 NOTE — Progress Notes (Signed)
 [facility]  PROGRESS NOTE  Brenda [name] FMW:[id number] DOB: [birth year] DOA: 07/15/2024 PCP: [facility]   Brief HPI:    64 y.o. female with PMH significant for metastatic endometrial cancer, chronic cancer-related pain, DM2, HTN, HLD, obesity, OSA, CAD/stent, CHF, CVA, h/o DVT and PE anticoagulated on Lovenox , chronic hypoxic respiratory failure on 2L via nasal cannula. Recently hospitalized 11/9 -11/11 for UTI, treated with IV antibiotics and discharged on oral antibiotics. Next day 11/12, patient presented to [facility] ED with chills and weakness.   She was found to have UTI and was started on IV cefepime .   Unfortunately mental status worsened  11/15, patient was noted to have a stiffening of upper extremities, she was transferred to [facility] for continuous EEG monitoring. 11/16, she was noted to have seizures and video EEG. She was started on Keppra . Meningitis/encephalitis was suspected and patient was started on empiric antibiotics.  CSF panel unremarkable and hence antibiotics were stopped  12/7, because of development of rigidity, neurology and oncology were reconsulted.  Palliative care consulted as well for cancer-related pain     Assessment/Plan:    Seizures Workup as above.  Currently on Keppra . Seizure precautions   Acute metabolic encephalopathy  Generalized rigidity Patient had significant altered mental status and generalized rigidity. Multiple possible etiologies were considered.   Neurology and oncology consult appreciated.  Cefepime  was discontinued.  Prozac  was discontinued.  EEG did not show epileptiform abnormalities.  CT head without acute intracranial abnormality.  MRI brain showed evolving infarcts. MRI C spine did not show any evidence of metastatic disease.  Other possible etiologies considered were - neurotoxicity from Keytruda  which was being used to treat her cancer.  Keytruda  was stopped.  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 S/p high dose steroids 12/9-12/11.  S/p trial sinimet.  S/p baclofen .   S/p 5 days of PLEX, completed on 12/28  Per neurology note from 12/29, likely favor an autoimmune process.  Mental status also influenced probably by pain regimen as well as hospital induced delirium.   Overall, patient's mental status seems to be gradually improving.  Neurology has signed off. Oncology and palliative care following. Family wants to continue aggressive care. 1/2, has agreed to PEG tube placement. IR consulted.   Metastatic Endometrial Cancer Cancer Related Pain  Seen by oncology and palliative care. She had had multiple imagings done during this hospitalization.  Per oncology note, there is no clear evidence of progression of the metastatic carcinoma. Keytruda  on hold as explained above. Pain regimen --- Scheduled: Oxycodone  5 mg every 8 hours. Reduced to BID today. --- PRN: IV Dilaudid , oxycodone , Tylenol     Recent DVT/PE,Splenic Infarct In October 2025, patient was diagnosed to have right leg DVT, PE as well as a splenic infarct and she was started on anticoagulation.   This hospitalization, anticoagulation was held due to psoas hematoma and GI bleed.   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   Patient had BRBPR during this hospitalization BRBPR resolved at this time CT GI bleed study did not show any evidence GI bleed. Patient was given 1 unit of PRBC transfusion on 12/1.  Also given IV Venofer  Hemoglobin currently between 7 and 8 for last few days.   Anticoagulation on hold. On DVT prophylaxis with Lovenox    Klebsiella Pneumoniae UTI Completed 5-day course of antibiotics    Dysphagia Moderate malnutrition  With altered mental status, patient was not able to to take oral intake for several days.  She is now eating some but very minimal and not enough  to sustain her nutrition.  She has been receiving tube feeds through [employer] therapist and dietitian following.  Family wants PEG tube placed as above   Type 2 diabetes mellitus A1c 7.2 on 06/01/2024 Currently on Lantus  15 units twice daily, insulin  aspart SSI every 4 hours  Continue to monitor  Hypertension 1/1, held previously ordered midodrine  5 mg twice daily and propranolol  10 mg 3 times daily  Blood pressure target less than 150 systolic.  Continue to monitor   Depression/anxiety Prozac  discontinued due to rigidity   H/o CAD, Stroke HLD Currently on Lipitor  80 mg daily.  Anticoagulation on hold as discussed above   Abnormal Thyroid  Function Tests It seems early in the course of hospitalization, TSH level was low and free T4 level was elevated at 1.41 on 11/16 Patient was started on propranolol  20 mg 3 times daily.  Subsequent repeat TSH level normal at 0.9 on 12/7 Propranolol  has been intermittently held because of low blood pressure.  1/1,  stopped propranolol .      DVT prophylaxis Lovenox   Medications     atorvastatin   80 mg Per Tube QHS   calcium  gluconate  2 g Intravenous Once   Chlorhexidine  Gluconate Cloth  6 each Topical Daily   enoxaparin  (LOVENOX ) injection  40 mg Subcutaneous Q24H   free water   75 mL Per Tube Q4H   gabapentin   100 mg Per Tube BID   heparin  sodium (porcine)  1,000 Units Intracatheter Once   insulin  aspart  0-9 Units Subcutaneous Q4H   insulin  glargine  15 Units Subcutaneous BID   levETIRAcetam   500 mg Intravenous Q12H   lidocaine   1 patch Transdermal Daily   nystatin   100,000 Units Topical TID   mouth rinse  15 mL Mouth Rinse 4 times per day   oxyCODONE   5 mg Oral BID   pantoprazole  (PROTONIX ) IV  40 mg Intravenous Q12H   polyethylene glycol  17 g Oral Daily   sodium chloride  flush  10-40 mL Intracatheter Q12H     Data Reviewed:   CBG:  Recent Labs  Lab 09/05/24 1602 09/05/24 1956 09/05/24 2314 09/06/24 0427  09/06/24 0822  GLUCAP 142* 101* 109* 119* 269*    SpO2: 99 % O2 Flow Rate (L/min): 2 L/min FiO2 (%): 21 %    Vitals:   09/05/24 1550 09/05/24 1943 09/06/24 0429 09/06/24 0825  BP: 122/61 116/61  (!) 133/55  Pulse: 77 66  80  Resp: 16 16  18   Temp: 98.8 F (37.1 C) 98.7 F (37.1 C)  98.6 F (37 C)  TempSrc:  Oral  Axillary  SpO2: 100% 99%  99%  Weight:   89.8 kg   Height:          Data Reviewed:  Basic Metabolic Panel: Recent Labs  Lab 08/30/24 2105 08/31/24 0650  NA 138 136  K 4.1 4.0  CL 105 104  CO2 27 26  GLUCOSE 169* 167*  BUN 14 13  CREATININE 0.34* <0.30*  CALCIUM  8.5* 8.3*  MG 1.6*  --     CBC: Recent Labs  Lab 08/30/24 2105 08/31/24 0650 09/02/24 0332  WBC 4.9 4.4 4.4  NEUTROABS  --  3.0  --   HGB 7.5* 7.5* 7.9*  HCT 23.4* 23.1* 25.1*  MCV 97.5 96.3 97.7  PLT 154 152 210  LFT Recent Labs  Lab 08/30/24 2105  AST 19  ALT 25  ALKPHOS 46  BILITOT 0.4  PROT 4.4*  ALBUMIN  3.4*     Antibiotics: Anti-infectives (From admission, onward)    Start     Dose/Rate Route Frequency Ordered Stop   08/16/24 1100  cefTRIAXone  (ROCEPHIN ) 2 g in sodium chloride  0.9 % 100 mL IVPB        2 g 200 mL/hr over 30 Minutes Intravenous Every 24 hours 08/16/24 1001 08/20/24 1214   07/23/24 0600  vancomycin  (VANCOCIN ) IVPB 1000 mg/200 mL premix  Status:  Discontinued        1,000 mg 200 mL/hr over 60 Minutes Intravenous Every 12 hours 07/22/24 1834 07/24/24 1808   07/22/24 2200  meropenem  (MERREM ) 2 g in sodium chloride  0.9 % 100 mL IVPB  Status:  Discontinued        2 g 280 mL/hr over 30 Minutes Intravenous Every 8 hours 07/22/24 1601 07/24/24 1808   07/22/24 1700  vancomycin  (VANCOREADY) IVPB 1500 mg/300 mL        1,500 mg 150 mL/hr over 120 Minutes Intravenous  Once 07/22/24 1601 07/22/24 2031   07/22/24 1645  ampicillin  (OMNIPEN) 2 g in sodium chloride  0.9 % 100 mL IVPB  Status:  Discontinued        2 g 300 mL/hr over 20 Minutes Intravenous Every  6 hours 07/22/24 1557 07/24/24 1800   07/19/24 0530  meropenem  (MERREM ) 1 g in sodium chloride  0.9 % 100 mL IVPB  Status:  Discontinued        1 g 200 mL/hr over 30 Minutes Intravenous Every 8 hours 07/19/24 0438 07/22/24 1601   07/16/24 0900  ceFEPIme  (MAXIPIME ) 2 g in sodium chloride  0.9 % 100 mL IVPB  Status:  Discontinued        2 g 200 mL/hr over 30 Minutes Intravenous Every 8 hours 07/16/24 0811 07/19/24 0409   07/16/24 0815  ceFEPIme  (MAXIPIME ) 2 g in sodium chloride  0.9 % 100 mL IVPB  Status:  Discontinued        2 g 200 mL/hr over 30 Minutes Intravenous  Once 07/16/24 0803 07/16/24 0811   07/16/24 0330  cefTRIAXone  (ROCEPHIN ) 2 g in sodium chloride  0.9 % 100 mL IVPB        2 g 200 mL/hr over 30 Minutes Intravenous  Once 07/16/24 0315 07/16/24 0412        CONSULTS neurology, oncology  Code Status: DNR  Family Communication: No family at bedside     Subjective   Patient seen and examined, complains of generalized bodyaches.  More alert and communicative today.   Objective    Physical Examination:   Appearance no acute distress Answers questions appropriately S1-S2, regular Lungs are clear to auscultation bilaterally Abdomen is soft, nontender, no organomegaly     Wound 07/25/24 1100 Pressure Injury Sacrum Medial Deep Tissue Pressure Injury - Purple or maroon localized area of discolored intact skin or blood-filled blister due to damage of underlying soft tissue from pressure and/or shear. (Active)     Wound 08/11/24 0415 Pressure Injury Vertebral column Medial Stage 2 -  Partial thickness loss of dermis presenting as a shallow open injury with a red, pink wound bed without slough. (Active)     Wound 08/31/24 2100 Pressure Injury Thigh Left;Posterior Stage 1 -  Intact skin with non-blanchable redness of a localized area usually over a bony prominence. (Active)        Brenda [name]  [facility] If 7PM-7AM, please contact night-coverage at  [email or web address], Office  [phone]   09/06/2024, 9:35 AM  LOS: 51 days

## 2024-09-06 NOTE — Plan of Care (Signed)
 Pt. Is more responsive, but still confused

## 2024-09-07 ENCOUNTER — Encounter (HOSPITAL_COMMUNITY): Payer: Self-pay | Admitting: Internal Medicine

## 2024-09-07 DIAGNOSIS — Z66 Do not resuscitate: Secondary | ICD-10-CM | POA: Diagnosis not present

## 2024-09-07 DIAGNOSIS — G9341 Metabolic encephalopathy: Secondary | ICD-10-CM | POA: Diagnosis not present

## 2024-09-07 DIAGNOSIS — Z515 Encounter for palliative care: Secondary | ICD-10-CM | POA: Diagnosis not present

## 2024-09-07 DIAGNOSIS — J9611 Chronic respiratory failure with hypoxia: Secondary | ICD-10-CM | POA: Diagnosis not present

## 2024-09-07 DIAGNOSIS — G893 Neoplasm related pain (acute) (chronic): Secondary | ICD-10-CM | POA: Diagnosis not present

## 2024-09-07 DIAGNOSIS — N390 Urinary tract infection, site not specified: Secondary | ICD-10-CM | POA: Diagnosis not present

## 2024-09-07 DIAGNOSIS — Z7189 Other specified counseling: Secondary | ICD-10-CM | POA: Diagnosis not present

## 2024-09-07 LAB — PREPARE RBC (CROSSMATCH)

## 2024-09-07 LAB — CBC
HCT: 26 % — ABNORMAL LOW (ref 36.0–46.0)
Hemoglobin: 8.1 g/dL — ABNORMAL LOW (ref 12.0–15.0)
MCH: 30.5 pg (ref 26.0–34.0)
MCHC: 31.2 g/dL (ref 30.0–36.0)
MCV: 97.7 fL (ref 80.0–100.0)
Platelets: 281 K/uL (ref 150–400)
RBC: 2.66 MIL/uL — ABNORMAL LOW (ref 3.87–5.11)
RDW: 17.9 % — ABNORMAL HIGH (ref 11.5–15.5)
WBC: 4.1 K/uL (ref 4.0–10.5)
nRBC: 2.7 % — ABNORMAL HIGH (ref 0.0–0.2)

## 2024-09-07 LAB — URINALYSIS, ROUTINE W REFLEX MICROSCOPIC
Bilirubin Urine: NEGATIVE
Glucose, UA: NEGATIVE mg/dL
Ketones, ur: NEGATIVE mg/dL
Nitrite: NEGATIVE
Protein, ur: 30 mg/dL — AB
Specific Gravity, Urine: 1.004 — ABNORMAL LOW (ref 1.005–1.030)
WBC, UA: >50 WBC/hpf (ref 0–5)
pH: 7 (ref 5.0–8.0)

## 2024-09-07 LAB — COMPREHENSIVE METABOLIC PANEL WITH GFR
ALT: 16 U/L (ref 0–44)
AST: 13 U/L — ABNORMAL LOW (ref 15–41)
Albumin: 3.2 g/dL — ABNORMAL LOW (ref 3.5–5.0)
Alkaline Phosphatase: 77 U/L (ref 38–126)
Anion gap: 4 — ABNORMAL LOW (ref 5–15)
BUN: 12 mg/dL (ref 8–23)
CO2: 32 mmol/L (ref 22–32)
Calcium: 8.8 mg/dL — ABNORMAL LOW (ref 8.9–10.3)
Chloride: 104 mmol/L (ref 98–111)
Creatinine, Ser: 0.3 mg/dL — ABNORMAL LOW (ref 0.44–1.00)
GFR, Estimated: >60 mL/min
Glucose, Bld: 93 mg/dL (ref 70–99)
Potassium: 4 mmol/L (ref 3.5–5.1)
Sodium: 140 mmol/L (ref 135–145)
Total Bilirubin: 0.2 mg/dL (ref 0.0–1.2)
Total Protein: 5 g/dL — ABNORMAL LOW (ref 6.5–8.1)

## 2024-09-07 LAB — GLUCOSE, CAPILLARY
Glucose-Capillary: 114 mg/dL — ABNORMAL HIGH (ref 70–99)
Glucose-Capillary: 118 mg/dL — ABNORMAL HIGH (ref 70–99)
Glucose-Capillary: 128 mg/dL — ABNORMAL HIGH (ref 70–99)
Glucose-Capillary: 170 mg/dL — ABNORMAL HIGH (ref 70–99)
Glucose-Capillary: 68 mg/dL — ABNORMAL LOW (ref 70–99)
Glucose-Capillary: 96 mg/dL (ref 70–99)
Glucose-Capillary: 99 mg/dL (ref 70–99)

## 2024-09-07 LAB — PROTIME-INR
INR: 1 (ref 0.8–1.2)
Prothrombin Time: 13.7 s (ref 11.4–15.2)

## 2024-09-07 MED ORDER — FUROSEMIDE 10 MG/ML IJ SOLN
20.0000 mg | Freq: Once | INTRAMUSCULAR | Status: AC
  Administered 2024-09-07: 20 mg via INTRAVENOUS
  Filled 2024-09-07: qty 4

## 2024-09-07 MED ORDER — DEXTROSE 50 % IV SOLN
12.5000 g | INTRAVENOUS | Status: AC
  Administered 2024-09-07: 12.5 g via INTRAVENOUS
  Filled 2024-09-07: qty 50

## 2024-09-07 MED ORDER — SODIUM CHLORIDE 0.9% IV SOLUTION: Freq: Once | INTRAVENOUS | Status: AC

## 2024-09-07 MED ORDER — DEXTROSE 50 % IV SOLN: 12.5000 g | INTRAVENOUS | Status: DC

## 2024-09-07 MED ORDER — BUSPIRONE HCL 10 MG PO TABS
5.0000 mg | ORAL_TABLET | Freq: Three times a day (TID) | ORAL | Status: DC
  Administered 2024-09-07 – 2024-09-08 (×3): 5 mg via ORAL
  Filled 2024-09-07 (×4): qty 1

## 2024-09-07 MED ORDER — IOHEXOL 9 MG/ML PO SOLN
500.0000 mL | Freq: Once | ORAL | Status: AC
  Administered 2024-09-07: 500 mL via ORAL

## 2024-09-07 NOTE — Progress Notes (Signed)
 Brenda [name] was a little bit lethargic this morning.  However, she did talk to me a little bit.  Her daughter was with her.  She, I think, would benefit from a transfusion.  Her hemoglobin is only 8.1.  Her white cell count is 4.1.  Platelet count 281,000.  Her BUN is 12 creatinine 0.3.  Calcium  8.8 with an albumin  of 3.2.  I think that she is going to have a feeding tube put into her stomach.  Currently she has a Panda tube.  At least, she is getting some nutrition.  She is not complaining of any obvious pain.  Her vital signs show temperature of 98.5.  Pulse 64.  Blood pressure 129/74.  Weight is 192 pounds.  Her head and neck exam shows no obvious ocular or oral lesions.  There is no scleral icterus.  I do not see any oral mucositis.  Oral mucosa might be little bit dry.  Her lungs are relatively clear bilaterally.  She has good air movement bilaterally.  Cardiac exam regular rate and rhythm.  She may have 1/6 systolic ejection murmur.  Abdominal exam is soft.  Bowel sounds are somewhat decreased but present.  She has no guarding or rebound tenderness.  Extremities show some chronic mild edema in the legs.  Neurological exam shows no focal deficits.  She has the global lethargy.  Hopefully, she is starting to improve after having the neurological incident.  She had been on plasma exchange.  She had been on steroids.  Nutrition is going to be key.  Hopefully, they will be able to get the feeding tube into the stomach.  Again I will transfuse her 1 unit of blood.  I talked to her daughter about this.  She agreed.  I think that this would help her out.  We will continue to follow her along.  Jeralyn Crease, MD  1 Maude 4:10

## 2024-09-07 NOTE — Progress Notes (Addendum)
 Hypoglycemic Event  CBG: 68  Treatment: D50 25 mL (12.5 gm)  Symptoms: None  Follow-up CBG: Time:0819 CBG Result:170  Possible Reasons for Event: NPO  Comments/MD notified:Dr. Drusilla

## 2024-09-07 NOTE — Progress Notes (Addendum)
 " Daily Progress Note   Date: 09/07/2024   Patient Name: Brenda [name]  DOB: Jun 03, 1961  MRN: [id number]  Age / Sex: 64 y.o., female  Attending Physician: Brenda Sabas RAMAN, [name] Primary Care Physician: [facility] Admit Date: 07/15/2024 Length of Stay: 52 days  Reason for Follow-up: Establishing goals of care  Past Medical History:  Diagnosis Date   Anemia    Arthritis    back, hands, hips (02/22/2015)   CAD (coronary artery disease)    a. complex LAD/diagonal bifurcation PCI in 2010. b. STEMI 10/2019 s/p PTCA/DES x1 to mLAD overlapping the old stent, residual disease treated medicaly.   Cancer Brenda [name])    uterine   CHF (congestive heart failure) (HCC)    Depression    Diabetes mellitus (HCC)    started when I was pregnant; not sure if it was type 1 or type 2    History of radiation therapy    Endometrium- HDR 01/22/22-03/07/22- Brenda [name]   Hypercholesterolemia    Hypertension    Morbid obesity (HCC)    Myocardial infarction (HCC)    mild x 3   Neuromuscular disorder (HCC)    neuropathy feet   Sleep apnea    cpap/    Assessment & Plan:   HPI/Patient Profile:   64 y.o. 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   Palliative medicine consulted for goals of care conversation.   SUMMARY OF RECOMMENDATIONS Continue current care plan Goals of care clear at this time for life-prolonging measures and pursuit of further treatment/rehab Agreeable to PEG, procedure delayed to 09/08/2024 PMT will continue to follow and support  Symptom Management:  Oxycodone  5 mg Q8 for pain Oxycodone  2.5-5 mg Q4 PRN for breakthrough pain Decreased frequency of Ativan  IV to Q4 PRN for agitation Decreased frequency of Dilaudid  IV to Q4 PRN for severe pain Xanax  0.25 mg TID PRN for  anxiety Ordered trazodone  as needed at bedtime for sleep  Code Status: DNR - Limited (DNR/DNI)  Prognosis: Unable to determine  Discharge Planning: To Be Determined  Discussed with: Brenda [name] 09/07/2024 about the patient's expression of considering hospice versus SNF at this time, the patient would like to continue to pursue a PEG tube, and daughters concerned about the patient's anxiety, depression, and paranoia.  Subjective:   Subjective: Chart Reviewed. Updates received. Patient Assessed. Created space and opportunity for patient  and family to explore thoughts and feelings regarding current medical situation.  Today's Discussion:  Met with the patient at the bedside with daughter Brenda [name]).  Patient was expressing that she was in a lot of discomfort, and when asked if she would like to receive any medication she said that would be all right.  Overall patient is a lot more conversant compared to prior visits, now is speaking in full sentences and overall making more sense.  Patient shares that she hopes to build to go home and spend time with her family and children.  Shared with patient that I wish that the medical team could get her to a point where she can go home, but that we are concerned that we may not build to get her well enough to go home safely at this time unless her goals are changed.  Patient shares that she understands she may have to pursue rehab prior to going home, but she is  also aware that she may have to live at a skilled nursing facility for very prolonged period of time.  Daughter shares with mom that the care that the patient needs going forward might be too much for the family to care for her safely at home.  Patient mentioned that she has considered hospice, but appears okay with continuing to pursue a PEG tube at this time and not ready to fully transition to comfort measures.  Patient repeatedly asked for a drink of soda, requiring repeated redirection and episodes that  she needs to remain n.p.o. at this time as she is scheduled to get a PEG tube placed.  Inquired with daughter 1 discussions as the patient had about hospice with the family, daughter shares that they have not had lengthy discussions about hospice with the patient.  Daughter inquires about the patient's anxiety and continued paranoia about strangers.  Daughter shares that she was expressing these ideas in the past and had been on medications to help manage her hallucinations.  Daughter also inquires about restarting the patient on some medications help with her depression.  Overall patient appears very religious and continues to request that the Brenda [name] take her pain away.  Daughter shares that the family remains hopeful that the patient will make some sort of recovery aware that the patient will have to live in a nursing home and the best case scenario.  She shares that her father and sister remain consistent with their goals of continuing all interventions that are available for the patient and are ready to consider hospice at this time.  Review of Systems  Constitutional:  Positive for fatigue.  Musculoskeletal:  Positive for back pain.    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  HENT:     Head: Normocephalic and atraumatic.     Nose: Nose normal.     Mouth/Throat:     Mouth: Mucous membranes are dry.  Cardiovascular:     Rate and Rhythm: Normal  rate.  Pulmonary:     Effort: Pulmonary effort is normal.     Comments: On nasal cannula Abdominal:     Palpations: Abdomen is soft.  Skin:    General: Skin is warm and dry.  Neurological:     Mental Status: She is alert.     Comments: Oriented to self, situation, and date  Psychiatric:     Comments: Anxious, paranoid about strangers    Palliative Assessment/Data: 30% (tube feeds)   Existing Vynca/ACP Documentation: None  Thank you for allowing us  to participate in the care of Brenda [name] PMT will continue to support holistically.  I personally spent a total of 50 minutes in the care of the patient today including preparing to see the patient, getting/reviewing separately obtained history, counseling and educating, referring and communicating with other health care professionals, and documenting clinical information in the  EHR.   [name]  Palliative Medicine Team  Team Phone # [phone] (Nights/Weekends) 09/07/2024 4:31 PM  "

## 2024-09-07 NOTE — Progress Notes (Signed)
 [facility]  PROGRESS NOTE  Brenda [name] FMW:[id number] DOB: May 29, 1961 DOA: 07/15/2024 PCP: [facility]   Brief HPI:    64 y.o. female with PMH significant for metastatic endometrial cancer, chronic cancer-related pain, DM2, HTN, HLD, obesity, OSA, CAD/stent, CHF, CVA, h/o DVT and PE anticoagulated on Lovenox , chronic hypoxic respiratory failure on 2L via nasal cannula. Recently hospitalized 11/9 -11/11 for UTI, treated with IV antibiotics and discharged on oral antibiotics. Next day 11/12, patient presented to [facility] ED with chills and weakness.   She was found to have UTI and was started on IV cefepime .   Unfortunately mental status worsened  11/15, patient was noted to have a stiffening of upper extremities, she was transferred to [facility] for continuous EEG monitoring. 11/16, she was noted to have seizures and video EEG. She was started on Keppra . Meningitis/encephalitis was suspected and patient was started on empiric antibiotics.  CSF panel unremarkable and hence antibiotics were stopped  12/7, because of development of rigidity, neurology and oncology were reconsulted.  Palliative care consulted as well for cancer-related pain     Assessment/Plan:    Seizures Workup as above.  Currently on Keppra . Seizure precautions   Acute metabolic encephalopathy  Generalized rigidity Patient had significant altered mental status and generalized rigidity. Multiple possible etiologies were considered.   Neurology and oncology consult appreciated.  Cefepime  was discontinued.  Prozac  was discontinued.  EEG did not show epileptiform abnormalities.  CT head without acute intracranial abnormality.  MRI brain showed evolving infarcts. MRI C spine did not show any evidence of metastatic disease.  Other possible etiologies considered were - neurotoxicity from Keytruda  which was being used to treat her cancer.  Keytruda  was stopped.  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 S/p high dose steroids 12/9-12/11.  S/p trial sinimet.  S/p baclofen .   S/p 5 days of PLEX, completed on 12/28  Per neurology note from 12/29, likely favor an autoimmune process.  Mental status also influenced probably by pain regimen as well as hospital induced delirium.   Overall, patient's mental status seems to be gradually improving.  Neurology has signed off. Oncology and palliative care following. Family wants to continue aggressive care. 1/2, has agreed to PEG tube placement. IR consulted.  Anxiety - Continue Ativan  1 mg IV every 4 hours as needed   Metastatic Endometrial Cancer Cancer Related Pain  Seen by oncology and palliative care. She had had multiple imagings done during this hospitalization.  Per oncology note, there is no clear evidence of progression of the metastatic carcinoma. Keytruda  on hold as explained above. Pain regimen --- Scheduled: Oxycodone  5 mg every 8 hours. Reduced to BID today. --- PRN: IV Dilaudid , oxycodone , Tylenol     Recent DVT/PE,Splenic Infarct In October 2025, patient was diagnosed to have right leg DVT, PE as well as a splenic infarct and she was started on anticoagulation.   This hospitalization, anticoagulation was held due to psoas hematoma and GI bleed.   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   Patient had BRBPR during this hospitalization BRBPR resolved at this time CT GI bleed study did not show any evidence GI bleed. Patient was given 1 unit of PRBC transfusion on 12/1.  Also given IV Venofer  Hemoglobin currently between 7 and 8 for last few days.   Anticoagulation on hold. On DVT prophylaxis with Lovenox    Klebsiella  Pneumoniae UTI Completed 5-day course of antibiotics    Dysphagia Moderate malnutrition With altered mental status, patient was not able to to take oral intake for several  days.  She is now eating some but very minimal and not enough to sustain her nutrition.  She has been receiving tube feeds through [employer] therapist and dietitian following.  IR consulted for PEG tube placement Plan for PEG tube placement on 09/08/2024   Type 2 diabetes mellitus A1c 7.2 on 06/01/2024 Currently on Lantus  15 units twice daily, insulin  aspart SSI every 4 hours  Continue to monitor  Hypertension 1/1, held previously ordered midodrine  5 mg twice daily and propranolol  10 mg 3 times daily  Blood pressure target less than 150 systolic.  Continue to monitor   Depression/anxiety Prozac  discontinued due to rigidity   H/o CAD, Stroke HLD Currently on Lipitor  80 mg daily.  Anticoagulation on hold as discussed above   Abnormal Thyroid  Function Tests It seems early in the course of hospitalization, TSH level was low and free T4 level was elevated at 1.41 on 11/16 Patient was started on propranolol  20 mg 3 times daily.  Subsequent repeat TSH level normal at 0.9 on 12/7 Propranolol  has been intermittently held because of low blood pressure.  1/1,  stopped propranolol .      DVT prophylaxis Lovenox   Medications     sodium chloride    Intravenous Once   atorvastatin   80 mg Per Tube QHS   calcium  gluconate  2 g Intravenous Once   Chlorhexidine  Gluconate Cloth  6 each Topical Daily   enoxaparin  (LOVENOX ) injection  40 mg Subcutaneous Q24H   free water   75 mL Per Tube Q4H   gabapentin   100 mg Per Tube BID   heparin  sodium (porcine)  1,000 Units Intracatheter Once   insulin  aspart  0-9 Units Subcutaneous Q4H   insulin  glargine-yfgn  15 Units Subcutaneous BID   levETIRAcetam   500 mg Intravenous Q12H   lidocaine   1 patch Transdermal Daily   nystatin   100,000 Units Topical TID   mouth rinse  15 mL Mouth Rinse 4 times per day   oxyCODONE   5 mg Oral BID   pantoprazole  (PROTONIX ) IV  40 mg Intravenous Q12H   polyethylene glycol  17 g Oral Daily   sodium chloride  flush   10-40 mL Intracatheter Q12H     Data Reviewed:   CBG:  Recent Labs  Lab 09/06/24 2048 09/06/24 2302 09/07/24 0405 09/07/24 0734 09/07/24 0819  GLUCAP 167* 170* 96 68* 170*    SpO2: 98 % O2 Flow Rate (L/min): 2 L/min FiO2 (%): 21 %    Vitals:   09/06/24 2127 09/07/24 0400 09/07/24 0430 09/07/24 0754  BP: (!) 126/56 129/74  135/60  Pulse: 68 64  76  Resp:    18  Temp: 99 F (37.2 C) 98.5 F (36.9 C)  98.2 F (36.8 C)  TempSrc: Axillary Axillary  Oral  SpO2: 98% 100%  98%  Weight:   87.1 kg   Height:          Data Reviewed:  Basic Metabolic Panel: Recent Labs  Lab 09/07/24 0420  NA 140  K 4.0  CL 104  CO2 32  GLUCOSE 93  BUN 12  CREATININE 0.30*  CALCIUM  8.8*    CBC: Recent Labs  Lab 09/02/24 0332 09/07/24 0420  WBC 4.4 4.1  HGB 7.9* 8.1*  HCT 25.1* 26.0*  MCV 97.7 97.7  PLT 210 281    LFT Recent Labs  Lab 09/07/24 0420  AST 13*  ALT 16  ALKPHOS 77  BILITOT 0.2  PROT 5.0*  ALBUMIN  3.2*     Antibiotics: Anti-infectives (From admission, onward)    Start     Dose/Rate Route Frequency Ordered Stop   08/16/24 1100  cefTRIAXone  (ROCEPHIN ) 2 g in sodium chloride  0.9 % 100 mL IVPB        2 g 200 mL/hr over 30 Minutes Intravenous Every 24 hours 08/16/24 1001 08/20/24 1214   07/23/24 0600  vancomycin  (VANCOCIN ) IVPB 1000 mg/200 mL premix  Status:  Discontinued        1,000 mg 200 mL/hr over 60 Minutes Intravenous Every 12 hours 07/22/24 1834 07/24/24 1808   07/22/24 2200  meropenem  (MERREM ) 2 g in sodium chloride  0.9 % 100 mL IVPB  Status:  Discontinued        2 g 280 mL/hr over 30 Minutes Intravenous Every 8 hours 07/22/24 1601 07/24/24 1808   07/22/24 1700  vancomycin  (VANCOREADY) IVPB 1500 mg/300 mL        1,500 mg 150 mL/hr over 120 Minutes Intravenous  Once 07/22/24 1601 07/22/24 2031   07/22/24 1645  ampicillin  (OMNIPEN) 2 g in sodium chloride  0.9 % 100 mL IVPB  Status:  Discontinued        2 g 300 mL/hr over 20 Minutes  Intravenous Every 6 hours 07/22/24 1557 07/24/24 1800   07/19/24 0530  meropenem  (MERREM ) 1 g in sodium chloride  0.9 % 100 mL IVPB  Status:  Discontinued        1 g 200 mL/hr over 30 Minutes Intravenous Every 8 hours 07/19/24 0438 07/22/24 1601   07/16/24 0900  ceFEPIme  (MAXIPIME ) 2 g in sodium chloride  0.9 % 100 mL IVPB  Status:  Discontinued        2 g 200 mL/hr over 30 Minutes Intravenous Every 8 hours 07/16/24 0811 07/19/24 0409   07/16/24 0815  ceFEPIme  (MAXIPIME ) 2 g in sodium chloride  0.9 % 100 mL IVPB  Status:  Discontinued        2 g 200 mL/hr over 30 Minutes Intravenous  Once 07/16/24 0803 07/16/24 0811   07/16/24 0330  cefTRIAXone  (ROCEPHIN ) 2 g in sodium chloride  0.9 % 100 mL IVPB        2 g 200 mL/hr over 30 Minutes Intravenous  Once 07/16/24 0315 07/16/24 0412        CONSULTS neurology, oncology  Code Status: DNR  Family Communication: No family at bedside     Subjective   Complains of anxiety this morning.  IV Ativan  1 mg every 4 hours as needed is already ordered.   Objective    Physical Examination:   Appears mildly anxious Answering questions appropriately S1-S2, regular Lungs clear to auscultation bilaterally Abdomen is soft, nontender, no organomegaly     Wound 07/25/24 1100 Pressure Injury Sacrum Medial Deep Tissue Pressure Injury - Purple or maroon localized area of discolored intact skin or blood-filled blister due to damage of underlying soft tissue from pressure and/or shear. (Active)     Wound 08/11/24 0415 Pressure Injury Vertebral column Medial Stage 2 -  Partial thickness loss of dermis presenting as a shallow open injury with a red, pink wound bed without slough. (Active)     Wound 08/31/24 2100 Pressure Injury Thigh Left;Posterior Stage 1 -  Intact skin with non-blanchable redness of a localized area usually over a bony prominence. (Active)        Brenda [name]   [facility]  If 7PM-7AM, please contact night-coverage at  [email or web address], Office  [phone]   09/07/2024, 9:16 AM  LOS: 52 days

## 2024-09-07 NOTE — TOC Progression Note (Signed)
 Transition of Care [facility]) - Progression Note    Patient Details  Name: Brenda [name] MRN: [id number] Date of Birth: July 19, 1961  Transition of Care [facility]) CM/SW Contact  [name], [place] Phone Number: 09/07/2024, 1:53 PM  Clinical Narrative:   CSW received call from patient's PCP, [facility], and provided brief update on patient's medical status. CSW to follow.    Expected Discharge Plan:  (TBD) Barriers to Discharge: Continued Medical Work up               Expected Discharge Plan and Services                                               Social Drivers of Health (SDOH) Interventions SDOH Screenings   Food Insecurity: No Food Insecurity (07/16/2024)  Recent Concern: Food Insecurity - Food Insecurity Present (04/20/2024)  Housing: Low Risk (07/16/2024)  Transportation Needs: No Transportation Needs (07/16/2024)  Utilities: Not At Risk (07/16/2024)  Depression (PHQ2-9): Low Risk (06/30/2024)  Recent Concern: Depression (PHQ2-9) - Medium Risk (05/07/2024)  Social Connections: Moderately Integrated (07/13/2024)  Tobacco Use: Medium Risk (07/16/2024)    Readmission Risk Interventions    07/14/2024    2:34 PM 05/04/2024   10:00 AM  Readmission Risk Prevention Plan  Transportation Screening Complete Complete  PCP or Specialist Appt within 3-5 Days  Complete  HRI or Home Care Consult  Complete  Social Work Consult for Recovery Care Planning/Counseling  Complete  Palliative Care Screening  Not Applicable  Medication Review [employer]) Complete Complete  PCP or Specialist appointment within 3-5 days of discharge Complete   HRI or Home Care Consult Complete   SW Recovery Care/Counseling Consult Complete   Palliative Care Screening Not Applicable   Skilled Nursing Facility Not Applicable

## 2024-09-07 NOTE — Consult Note (Signed)
 "     Chief Complaint: Patient was seen in consultation today for dysphagia  Referring Physician(s): Dr. Cordella Brod  Supervising Physician: Hughes Simmonds  Patient Status: [facility] - In-pt  History of Present Illness: Brenda [name] is a 64 y.o. female with past medical history of metastatic endometrial cancer s/p hysterectomy and adjuvant radiation (2023), HTN, chronic cancer related pain, CVA (05/2024) admitted on 07/15/2024 with cefepime  neurotoxicity now with prolonged hospitalization and progressive decline.  She has bouts of waxing/waning encephalopathy with dysphagia currently with NGT in place.  Team requesting percutaneous gastrostomy tube placement. Case reviewed by Dr. Hughes who approves gastrostomy tube placement with contrast PO night prior.   Reviewed recommendations and procedure with patient's husband due to encephalopathy.  Patient undergoing patient care during visit and is complaining of pain. Per RN requiring ativan  and dilaudid  and only achieving tolerable pain.  Per husband, he is concerned some of her encephalopathy may be due to underlying UTI for which she was recently treated.  He is agreeable to G-tube placement as patient has expressed desire for tube placement during her lucid moments.   Past Medical History:  Diagnosis Date   Anemia    Arthritis    back, hands, hips (02/22/2015)   CAD (coronary artery disease)    a. complex LAD/diagonal bifurcation PCI in 2010. b. STEMI 10/2019 s/p PTCA/DES x1 to mLAD overlapping the old stent, residual disease treated medicaly.   Cancer [facility])    uterine   CHF (congestive heart failure) (HCC)    Depression    Diabetes mellitus (HCC)    started when I was pregnant; not sure if it was type 1 or type 2    History of radiation therapy    Endometrium- HDR 01/22/22-03/07/22- Dr. Lynwood Nasuti   Hypercholesterolemia    Hypertension    Morbid obesity (HCC)    Myocardial infarction (HCC)    mild x 3   Neuromuscular disorder  (HCC)    neuropathy feet   Sleep apnea    cpap/    Past Surgical History:  Procedure Laterality Date   CARDIAC CATHETERIZATION  12/02/2012   CHOLECYSTECTOMY OPEN  09/04/1987   CORONARY ANGIOPLASTY WITH STENT PLACEMENT  09/03/2009   CORONARY/GRAFT ACUTE MI REVASCULARIZATION N/A 10/26/2019   Procedure: CORONARY/GRAFT ACUTE MI REVASCULARIZATION;  Surgeon: Verlin Lonni BIRCH, MD;  Location: [facility] INVASIVE CV LAB;  Service: Cardiovascular;  Laterality: N/A;   INCISIONAL HERNIA REPAIR N/A 12/09/2021   Procedure: LAPAROSCOPIC REPAIR UMBILICAL HERNIA WITH MESH;  Surgeon: Vernetta Berg, MD;  Location: [facility] ORS;  Service: General;  Laterality: N/A;   IR IMAGING GUIDED PORT INSERTION  04/08/2024   IR TUNNELED CENTRAL VENOUS CATH PLC W IMG  08/18/2024   LEFT HEART CATH AND CORONARY ANGIOGRAPHY N/A 10/26/2019   Procedure: LEFT HEART CATH AND CORONARY ANGIOGRAPHY;  Surgeon: Verlin Lonni BIRCH, MD;  Location: [facility] INVASIVE CV LAB;  Service: Cardiovascular;  Laterality: N/A;   LEFT HEART CATHETERIZATION WITH CORONARY ANGIOGRAM N/A 12/25/2012   Procedure: LEFT HEART CATHETERIZATION WITH CORONARY ANGIOGRAM;  Surgeon: Ozell Fell, MD;  Location: [facility] CATH LAB;  Service: Cardiovascular;  Laterality: N/A;   RADIOLOGY WITH ANESTHESIA N/A 04/21/2024   Procedure: MRI WITH ANESTHESIA;  Surgeon: Radiologist, Medication, MD;  Location: [facility] OR;  Service: Radiology;  Laterality: N/A;  BRAIN W/ W/O   ROBOTIC ASSISTED TOTAL HYSTERECTOMY WITH BILATERAL SALPINGO OOPHERECTOMY N/A 12/06/2021   Procedure: XI ROBOTIC ASSISTED TOTAL HYSTERECTOMY WITH BILATERAL SALPINGO OOPHORECTOMY;  Surgeon: [name] SAUNDERS, MD;  Location:  [facility] ORS;  Service: Gynecology;  Laterality: N/A;   UMBILICAL HERNIA REPAIR  05/04/1989   doesn't think they used mesh    Allergies: Hydrocodone and Clopidogrel  Medications: Prior to Admission medications  Medication Sig Start Date End Date Taking? Authorizing Provider  acetaminophen  (TYLENOL )  500 MG tablet Take 1-2 tablets (500-1,000 mg total) by mouth every 8 (eight) hours as needed for mild pain (pain score 1-3) or moderate pain (pain score 4-6). 03/30/24 03/30/25 Yes Hongalgi, Anand D, MD  atorvastatin  (LIPITOR ) 80 MG tablet Take 1 tablet (80 mg total) by mouth every evening. 06/11/24  Yes Jerilynn Daphne SAILOR, NP  Cholecalciferol  (VITAMIN D3 SUPER STRENGTH) 50 MCG (2000 UT) CAPS Take 2,000 Units by mouth in the morning.   Yes [provider]  enoxaparin  (LOVENOX ) 80 MG/0.8ML injection Inject 0.8 mLs (80 mg total) into the skin every 12 (twelve) hours. 06/22/24 07/22/24 Yes Elgergawy, Brayton RAMAN, MD  FLUoxetine  (PROZAC ) 40 MG capsule Take 1 capsule (40 mg total) by mouth daily. 06/11/24  Yes Jerilynn Daphne SAILOR, NP  guaiFENesin  (MUCINEX ) 600 MG 12 hr tablet Take 1 tablet (600 mg total) by mouth 2 (two) times daily as needed for cough or to loosen phlegm. 05/05/24  Yes Caleen Qualia, MD  haloperidol  (HALDOL ) 1 MG tablet Take 2 tablets (2 mg total) by mouth 2 (two) times daily. 06/22/24  Yes Elgergawy, Brayton RAMAN, MD  hydrOXYzine  (ATARAX ) 10 MG tablet Take 1 tablet (10 mg total) by mouth 3 (three) times daily as needed for anxiety. 06/22/24  Yes Elgergawy, Brayton RAMAN, MD  insulin  aspart (FIASP ) 100 UNIT/ML FlexTouch Pen Inject 1-3 Units into the skin 3 (three) times daily with meals. Per sliding scale   Yes [provider]  insulin  degludec (TRESIBA ) 100 UNIT/ML FlexTouch Pen Inject 25 Units into the skin daily. Patient taking differently: Inject 18 Units into the skin daily. 06/29/24  Yes Gorsuch, Almarie, MD  lactulose  (CHRONULAC ) 10 GM/15ML solution Take 45 mLs (30 g total) by mouth 2 (two) times daily as needed for mild constipation or moderate constipation. 05/05/24  Yes Caleen Qualia, MD  lidocaine  (LIDODERM ) 5 % Place 1 patch onto the skin daily. Placed on back below tumor 04/27/24  Yes [provider]  metFORMIN  (GLUCOPHAGE -XR) 500 MG 24 hr tablet Take 2 tablets (1,000 mg total)  by mouth 2 (two) times daily with a meal. 06/11/24  Yes Jerilynn Daphne SAILOR, NP  mirtazapine  (REMERON ) 15 MG tablet Take 1 tablet (15 mg total) by mouth at bedtime. 06/22/24  Yes Elgergawy, Brayton RAMAN, MD  Misc Natural Products (NEURIVA PO) Take 1 tablet by mouth daily.   Yes [provider]  nitroGLYCERIN  (NITROSTAT ) 0.4 MG SL tablet Dissolve 1 tablet under the tongue every 5 minutes as needed for chest pain. Max of 3 doses, then 911. 01/25/23  Yes Weaver, Scott T, PA-C  nystatin  cream (MYCOSTATIN ) Apply topically 2 (two) times daily. Patient taking differently: Apply topically 2 (two) times daily as needed for dry skin (yeast). 06/11/24  Yes Jerilynn Daphne SAILOR, NP  polyethylene glycol powder (GLYCOLAX /MIRALAX ) 17 GM/SCOOP powder Dissolve 1 capful (17g) in 4-8 ounces of liquid and take by mouth daily as needed for mild constipation. 06/11/24  Yes Jerilynn Daphne SAILOR, NP  pregabalin  (LYRICA ) 50 MG capsule Take 1 capsule (50 mg total) by mouth 3 (three) times daily. 06/11/24  Yes Jerilynn Daphne SAILOR, NP  Misc. Devices MISC Portable oxygen  concentrator, 2L oxygen .  Diagnoses-chronic respiratory failure with hypoxia 11/27/21   Newlin,  Enobong, MD  Oxycodone  HCl 10 MG TABS Take 1 tablet (10 mg total) by mouth every 6 (six) hours as needed (pain). 06/29/24   Lonn Hicks, MD     Family History  Problem Relation Age of Onset   Coronary artery disease Mother        in her mid 79s   Hypertension Mother    Cancer Mother        skin   Heart attack Father        in his mid 40s   COPD Father    Stroke Sister    Coronary artery disease Sister    Diabetes Sister    Other Half-Sister    Thyroid  cancer Daughter    Prostate cancer Neg Hx    Colon cancer Neg Hx    Breast cancer Neg Hx    Endometrial cancer Neg Hx    Ovarian cancer Neg Hx    Pancreatic cancer Neg Hx     Social History   Socioeconomic History   Marital status: Married    Spouse name: Not on file   Number of children: 2   Years of  education: Not on file   Highest education level: Not on file  Occupational History   Occupation: Housewife  Tobacco Use   Smoking status: Former    Current packs/day: 0.00    Average packs/day: 0.5 packs/day for 1 year (0.5 ttl pk-yrs)    Types: Cigarettes, Cigars    Start date: 09/03/1985    Quit date: 09/03/1986    Years since quitting: 38.0   Smokeless tobacco: Never  Vaping Use   Vaping status: Never Used  Substance and Sexual Activity   Alcohol use: No   Drug use: No   Sexual activity: Not Currently  Other Topics Concern   Not on file  Social History Narrative   Not on file   Social Drivers of Health   Tobacco Use: Medium Risk (09/07/2024)   Patient History    Smoking Tobacco Use: Former    Smokeless Tobacco Use: Never    Passive Exposure: Not on Actuary Strain: Not on file  Food Insecurity: No Food Insecurity (07/16/2024)   Epic    Worried About Programme Researcher, Broadcasting/film/video in the Last Year: Never true    Ran Out of Food in the Last Year: Never true  Recent Concern: Food Insecurity - Food Insecurity Present (04/20/2024)   Epic    Worried About Programme Researcher, Broadcasting/film/video in the Last Year: Sometimes true    Ran Out of Food in the Last Year: Never true  Transportation Needs: No Transportation Needs (07/16/2024)   Epic    Lack of Transportation (Medical): No    Lack of Transportation (Non-Medical): No  Physical Activity: Not on file  Stress: Not on file  Social Connections: Moderately Integrated (07/13/2024)   Social Connection and Isolation Panel    Frequency of Communication with Friends and Family: More than three times a week    Frequency of Social Gatherings with Friends and Family: Three times a week    Attends Religious Services: 1 to 4 times per year    Active Member of Clubs or Organizations: No    Attends Banker Meetings: Never    Marital Status: Married  Depression (PHQ2-9): Low Risk (06/30/2024)   Depression (PHQ2-9)    PHQ-2 Score: 0   Recent Concern: Depression (PHQ2-9) - Medium Risk (05/07/2024)   Depression (PHQ2-9)    PHQ-2  Score: 9  Alcohol Screen: Not on file  Housing: Low Risk (07/16/2024)   Epic    Unable to Pay for Housing in the Last Year: No    Number of Times Moved in the Last Year: 0    Homeless in the Last Year: No  Utilities: Not At Risk (07/16/2024)   Epic    Threatened with loss of utilities: No  Health Literacy: Not on file     Review of Systems: A 12 point ROS discussed and pertinent positives are indicated in the HPI above.  All other systems are negative.  Review of Systems  Unable to perform ROS: Mental status change    Vital Signs: BP (!) 155/77   Pulse 92   Temp 99.3 F (37.4 C)   Resp 18   Ht 5' 2 (1.575 m)   Wt 192 lb (87.1 kg)   LMP 10/30/2014   SpO2 100%   BMI 35.12 kg/m   Physical Exam Vitals and nursing note reviewed.  Constitutional:      Comments: In pain  HENT:     Nose:     Comments: NGT in place Cardiovascular:     Rate and Rhythm: Normal rate.  Pulmonary:     Effort: Pulmonary effort is normal.  Neurological:     Mental Status: She is alert.      MD Evaluation Airway: WNL Heart: WNL Abdomen: WNL Chest/ Lungs: WNL ASA  Classification: 3 Mallampati/Airway Score: Two   Imaging: CT ANGIO GI BLEED Result Date: 08/18/2024 CLINICAL DATA:  Bright red blood per rectum. EXAM: CTA ABDOMEN AND PELVIS WITHOUT AND WITH CONTRAST TECHNIQUE: Multidetector CT imaging of the abdomen and pelvis was performed using the standard protocol during bolus administration of intravenous contrast. Multiplanar reconstructed images and MIPs were obtained and reviewed to evaluate the vascular anatomy. RADIATION DOSE REDUCTION: This exam was performed according to the departmental dose-optimization program which includes automated exposure control, adjustment of the mA and/or kV according to patient size and/or use of iterative reconstruction technique. CONTRAST:  75mL OMNIPAQUE   IOHEXOL  350 MG/ML SOLN COMPARISON:  None Available. FINDINGS: VASCULAR Aorta: Normal caliber aorta without aneurysm, dissection, vasculitis or significant stenosis. Celiac: Patent without evidence of aneurysm, dissection, vasculitis or significant stenosis. SMA: Patent without evidence of aneurysm, dissection, vasculitis or significant stenosis. Renals: Both renal arteries are patent without evidence of aneurysm, dissection, vasculitis, fibromuscular dysplasia or significant stenosis. IMA: Patent without evidence of aneurysm, dissection, vasculitis or significant stenosis. Inflow: Patent without evidence of aneurysm, dissection, vasculitis or significant stenosis. Proximal Outflow: The visualized proximal of fluids patent. Veins: No obvious venous abnormality within the limitations of this arterial phase study. Review of the MIP images confirms the above findings. NON-VASCULAR Lower chest: The visualized lung bases are clear. Advanced coronary vascular calcification of the LAD. No intra-abdominal free air or free fluid. Hepatobiliary: The liver is unremarkable. There is dilatation, post cholecystectomy. No retained calcified stone noted in the central CBD. Pancreas: Unremarkable. No pancreatic ductal dilatation or surrounding inflammatory changes. Spleen: Normal in size without focal abnormality. Adrenals/Urinary Tract: The adrenal glands are unremarkable. There is no hydronephrosis or nephrolithiasis on either side. There is symmetric enhancement of the kidneys. The visualized ureters appear unremarkable. The urinary bladder is decompressed around a Foley catheter. Stomach/Bowel: Feeding tube with tip in the proximal duodenum. There is no bowel obstruction or active inflammation. No evidence of active GI bleed. The appendix is normal. Lymphatic: No adenopathy. Reproductive: Hysterectomy.  No suspicious adnexal masses. Other: None  Musculoskeletal: No acute osseous pathology. Degenerative changes of the spine.  Syncope L2 sclerotic changes. There is a 1.6 x 4.2 cm soft tissue mass to the left the spine at L2 concerning for a neoplastic process. Further evaluation with MRI without and with contrast recommended. There is enlargement of the right psoas muscle with an intramuscular collection measuring 4.7 x 4.2 cm in greatest axial dimension most consistent with an intramuscular hematoma. IMPRESSION: 1. No evidence of active GI bleed. 2. No bowel obstruction. Normal appendix. 3. Right psoas intramuscular hematoma. 4. L2 sclerotic changes with left paraspinal masslike tissue at L2 concerning for a neoplastic process. Further evaluation with MRI without and with contrast recommended. Electronically Signed   By: Vanetta Chou M.D.   On: 08/18/2024 16:21   IR NON-TUNNELED CENTRAL VENOUS CATH [facility] W IMG Result Date: 08/18/2024 INDICATION: Catheter needed for plasmapheresis. EXAM: FLUOROSCOPIC AND ULTRASOUND GUIDED PLACEMENT OF A NON-TUNNELED DIALYSIS/PHERESIS CATHETER Physician: Juliene SAUNDERS. Philip, MD MEDICATIONS: 1% lidocaine  for local anesthetic ANESTHESIA/SEDATION: None FLUOROSCOPY TIME:  Radiation Exposure Index (as provided by the fluoroscopic device): 4 mGy Kerma COMPLICATIONS: None immediate. PROCEDURE: Informed consent was obtained for catheter placement. The patient was placed supine on the interventional table. Ultrasound confirmed a patent right internal jugular vein. Ultrasound images were obtained for documentation. The right neck was prepped and draped in a sterile fashion. Maximal barrier sterile technique was utilized including caps, mask, sterile gowns, sterile gloves, sterile drape, hand hygiene and skin antiseptic. The right neck was anesthetized with 1% lidocaine . A small incision was made with #11 blade scalpel. A 21 gauge needle directed into the right internal jugular vein with ultrasound guidance. A micropuncture dilator set was placed. A 20 cm Mahurkar catheter was selected. The catheter was advanced over  a wire and positioned in the right atrium. Fluoroscopic images were obtained for documentation. Both dialysis lumens were found to aspirate and flush well. The proper amount of heparin  was flushed in both lumens. The central venous lumen was flushed with normal saline. Catheter was sutured to skin. FINDINGS: Catheter tip in the right atrium. IMPRESSION: Successful placement of a right jugular non-tunneled pheresis catheter using ultrasound and fluoroscopic guidance. Electronically Signed   By: Juliene Philip M.D.   On: 08/18/2024 14:17   DG Chest Port 1V same Day Result Date: 08/15/2024 CLINICAL DATA:  [id number] Fever [id number] EXAM: PORTABLE CHEST 1 VIEW COMPARISON:  July 16, 2024, July 12, 2024 FINDINGS: The cardiomediastinal silhouette is unchanged in contour.Low lung volume radiograph. Elevation of the RIGHT hemidiaphragm. RIGHT chest port with tip terminating over the superior cavoatrial junction. Enteric tube tip terminates over the proximal duodenum. No pleural effusion. No pneumothorax. RIGHT midlung bandlike opacity. IMPRESSION: RIGHT midlung bandlike opacity, likely atelectasis. Electronically Signed   By: Corean Salter M.D.   On: 08/15/2024 16:01   Overnight EEG with video Result Date: 08/15/2024 Shelton Brenda KIDD, MD     08/16/2024  9:14 AM Patient Name: Brenda [name] MRN: [id number] Epilepsy Attending: Arlin KIDD Shelton Referring Physician/Provider: Waddell Karna LABOR, NP Duration: 08/14/2024 1128 to 08/15/2024 1128 Patient history: 63yo F with seizure like activity. EEG to evaluate for seizure Level of alertness: Awake, asleep AEDs during EEG study: LEV Technical aspects: This EEG study was done with scalp electrodes positioned according to the 10-20 International system of electrode placement. Electrical activity was reviewed with band pass filter of 1-70Hz , sensitivity of 7 uV/mm, display speed of 72mm/sec with a 60Hz  notched filter applied as appropriate. EEG data were recorded  continuously and digitally stored.  Video monitoring was available and reviewed as appropriate. Description: The posterior dominant rhythm consists of 5-6 Hz activity of moderate voltage (25-35 uV) seen predominantly in posterior head regions, symmetric and reactive to eye opening and eye closing. Sleep was characterized by sleep spindles (12 to 14 Hz), maximal frontocentral region. There is continuous generalized polymorphic, at times sharply contoured 3 to 6 Hz theta-delta slowing. Hyperventilation and photic stimulation were not performed.   ABNORMALITY - Continuous slow, generalized IMPRESSION: This study is suggestive of generalized cerebral dysfunction (encephalopathy). No seizures or definite epileptiform discharges were seen throughout the recording. Brenda [name]   MR CERVICAL SPINE W WO CONTRAST Result Date: 08/09/2024 EXAM: MRI CERVICAL SPINE WITH AND WITHOUT CONTRAST 08/09/2024 07:04:28 PM TECHNIQUE: Multiplanar multisequence MRI of the cervical spine was performed without and with the administration of intravenous contrast. CONTRAST: 8 mL of Gadavist . COMPARISON: None available. CLINICAL HISTORY: Metastatic disease evaluation, myelopathy, acute, cervical spine. FINDINGS: BONES AND ALIGNMENT: Normal alignment. Normal vertebral body heights. No suspicious bone lesions. No abnormal enhancement. SPINAL CORD: Normal spinal cord size. Normal spinal cord signal. SOFT TISSUES: No paraspinal mass. C2-C3: No significant disc herniation. No spinal canal stenosis or neural foraminal narrowing. C3-C4: Left facet and uncovertebral hypertrophy. Mild left foraminal stenosis. Patent canal. C4-C5: No significant disc herniation. No spinal canal stenosis or neural foraminal narrowing. C5-C6: Right facet and uncovertebral hypertrophy. Mild right foraminal stenosis. Patent canal. C6-C7: Right facet and uncovertebral hypertrophy. Mild right foraminal stenosis. Patent canal. C7-T1: No significant disc herniation. No  spinal canal stenosis or neural foraminal narrowing. IMPRESSION: 1. No evidence of metastatic disease. 2. Mild foraminal stenosis on the left at C3-C4 and right at C5-C6 and C6-C7. Electronically signed by: Gilmore Molt MD 08/09/2024 09:23 PM EST RP Workstation: [id number]   MR BRAIN W WO CONTRAST Result Date: 08/09/2024 EXAM: MRI BRAIN WITH AND WITHOUT CONTRAST 08/09/2024 07:04:11 PM TECHNIQUE: Multiplanar multisequence MRI of the head/brain was performed with and without the administration of intravenous contrast. CONTRAST: 8 mL of Gadavist . COMPARISON: MR Brain without Contrast 07/18/2024. CLINICAL HISTORY: Metastatic disease evaluation, neuro deficit, acute, stroke suspected. FINDINGS: BRAIN AND VENTRICLES: Multifocally evolving infarcts in the anterior and posterior circulation including small infarct in bilateral frontoparietal white matter and right occipital lobe with developing encephalomalacia and decreased restricted diffusion. No evidence of new/interval acute infarct. No acute intracranial hemorrhage. No mass effect or midline shift. No hydrocephalus. Normal flow voids. No mass or abnormal enhancement. ORBITS: No acute abnormality. SINUSES: No acute abnormality. BONES AND SOFT TISSUES: Normal bone marrow signal and enhancement. No acute soft tissue abnormality. IMPRESSION: 1. Evolving infarcts without new/interval acute abnormality. 2. No evidence of metastatic disease. Electronically signed by: Gilmore Molt MD 08/09/2024 08:50 PM EST RP Workstation: [id number]   CT CHEST ABDOMEN PELVIS W CONTRAST Result Date: 08/09/2024 EXAM: CT CHEST, ABDOMEN AND PELVIS WITH CONTRAST 08/09/2024 05:06:09 PM TECHNIQUE: CT of the chest, abdomen and pelvis was performed with the administration of intravenous contrast. 75 mL of iohexol  (OMNIPAQUE ) 350 MG/ML injection was administered. Multiplanar reformatted images are provided for review. Automated exposure control, iterative reconstruction, and/or weight  based adjustment of the mA/kV was utilized to reduce the radiation dose to as low as reasonably achievable. COMPARISON: CT abdomen and pelvis 06/19/2024. CLINICAL HISTORY: Metastatic disease evaluation. FINDINGS: CHEST: MEDIASTINUM AND LYMPH NODES: Heart and pericardium are unremarkable. The central airways are clear. No mediastinal, hilar or axillary lymphadenopathy. LUNGS AND PLEURA: No focal consolidation or pulmonary edema. No pleural  effusion or pneumothorax. No pulmonary nodules. ABDOMEN AND PELVIS: LIVER: The liver is unremarkable. GALLBLADDER AND BILE DUCTS: Prior cholecystectomy. Slight intrahepatic and extrahepatic biliary ductal dilatation likely related to post-cholecystectomy state, stable since prior study. SPLEEN: Peripheral wedge-shaped low-density area posteriorly in the spleen, likely splenic infarct, decreased in size since prior study. PANCREAS: No acute abnormality. ADRENAL GLANDS: No acute abnormality. KIDNEYS, URETERS AND BLADDER: No stones in the kidneys or ureters. No hydronephrosis. No perinephric or periureteral stranding. Urinary bladder is unremarkable. GI AND BOWEL: Stomach demonstrates no acute abnormality. Feeding tube tip in the proximal stomach. There is no bowel obstruction. REPRODUCTIVE ORGANS: Prior hysterectomy. PERITONEUM AND RETROPERITONEUM: No ascites. No free air. VASCULATURE: Aorta is normal in caliber. Coronary artery and aortic atherosclerosis. Right port-a-cath in place with the tip at the cavoatrial junction. ABDOMINAL AND PELVIS LYMPH NODES: No lymphadenopathy. BONES AND SOFT TISSUES: No acute osseous abnormality. Previously seen left psoas/paraspinal mass has decreased significantly in size, now difficult to visualize but measuring approximately 2.8 x 1.9 cm, previously 10.2 x 8.5 x 7.5 cm. There is new abnormality/enlargement within the right psoas muscle with a mass-like area measuring 5 x 4.6 cm on image 83. This could reflect tumor or hematoma. IMPRESSION: 1. New  right psoas muscle mass-like enlargement measuring 5.0 x 4.6 cm, indeterminate for tumor versus hematoma; recommend correlation with clinical status and consider further evaluation with contrast-enhanced MRI or short-interval follow-up imaging for characterization and to exclude progression of metastatic disease. 2. Previously noted left psoas/paraspinal mass has decreased significantly in size, now approximately 2.8 x 1.9 cm from 10.2 x 8.5 x 7.5 cm, compatible with treatment response. 3. Coronary artery disease, aortic atherosclerosis. Electronically signed by: Franky Crease MD 08/09/2024 08:25 PM EST RP Workstation: [id number]   EEG adult Result Date: 08/09/2024 Brenda Bady Brenda HERO, MD     08/10/2024  7:23 AM Routine EEG Report Brenda [name] is a 64 y.o. female with a history of altered mental status who is undergoing an EEG to evaluate for seizures. Report: This EEG was acquired with electrodes placed according to the International 10-20 electrode system (including Fp1, Fp2, F3, F4, C3, C4, P3, P4, O1, O2, T3, T4, T5, T6, A1, A2, Fz, Cz, Pz). The following electrodes were missing or displaced: none. The occipital dominant rhythm was 5-6 Hz. This activity is reactive to stimulation. Drowsiness was manifested by background fragmentation; deeper stages of sleep were identified by K complexes and sleep spindles. There was no focal slowing. There were no interictal epileptiform discharges. There were no electrographic seizures identified. There was no abnormal response to photic stimulation or hyperventilation. Impression and clinical correlation: This EEG was obtained while awake and asleep and is abnormal due to moderate diffuse slowing indicative of global cerebral dysfunction. Epileptiform abnormalities were not seen during this recording. Brenda Celia Friedland, MD [facility] [phone] If 7pm- 7am, please page neurology on call as listed in AMION.   CT HEAD WO CONTRAST ( ) Result Date:  08/09/2024 EXAM: CT HEAD WITHOUT CONTRAST 08/09/2024 05:18:12 PM TECHNIQUE: CT of the head was performed without the administration of intravenous contrast. Automated exposure control, iterative reconstruction, and/or weight based adjustment of the mA/kV was utilized to reduce the radiation dose to as low as reasonably achievable. COMPARISON: 07/18/2024 CLINICAL HISTORY: Neuro deficit, acute, stroke suspected FINDINGS: BRAIN AND VENTRICLES: No acute hemorrhage. No evidence of acute infarct. Remote right PCA territory infarct. Unchanged calcified meningioma along the left frontal convexity. Calcific atherosclerosis. No hydrocephalus. No extra-axial collection. No mass effect or  midline shift. ORBITS: No acute abnormality. SINUSES: No acute abnormality. SOFT TISSUES AND SKULL: No acute soft tissue abnormality. No skull fracture. IMPRESSION: 1. No acute intracranial abnormality. 2. Remote right PCA territory infarct. 3. Unchanged calcified left frontal convexity meningioma. Electronically signed by: Lonni Necessary MD 08/09/2024 06:23 PM EST RP Workstation: [id number]    Labs:  CBC: Recent Labs    08/30/24 2105 08/31/24 0650 09/02/24 0332 09/07/24 0420  WBC 4.9 4.4 4.4 4.1  HGB 7.5* 7.5* 7.9* 8.1*  HCT 23.4* 23.1* 25.1* 26.0*  PLT 154 152 210 281    COAGS: Recent Labs    06/20/24 0115 06/20/24 1249 06/20/24 2232 06/21/24 0430 07/12/24 2313 07/16/24 0200 08/18/24 1223 09/07/24 0420  INR  --   --   --   --  1.0 1.0 1.0 1.0  APTT 55* 65* 68* 67*  --   --   --   --     BMP: Recent Labs    08/29/24 0137 08/30/24 2105 08/31/24 0650 09/07/24 0420  NA 138 138 136 140  K 4.2 4.1 4.0 4.0  CL 102 105 104 104  CO2 31 27 26  32  GLUCOSE 127* 169* 167* 93  BUN 11 14 13 12   CALCIUM  8.8* 8.5* 8.3* 8.8*  CREATININE 0.31* 0.34* <0.30* 0.30*  GFRNONAA >60 >60 NOT CALCULATED >60    LIVER FUNCTION TESTS: Recent Labs    08/22/24 0500 08/23/24 0350 08/30/24 2105 09/07/24 0420   BILITOT 0.3 0.3 0.4 0.2  AST 33 41 19 13*  ALT 64* 72* 25 16  ALKPHOS 69 83 46 77  PROT 4.2* 4.5* 4.4* 5.0*  ALBUMIN  3.4* 3.2* 3.4* 3.2*    TUMOR MARKERS: No results for input(s): AFPTM, CEA, CA199, CHROMGRNA in the last 8760 hours.  Assessment and Plan: Dysphagia Patient with with encephalopathy, prolonged hospitalization, failure to thrive.  Gastrostomy tube placement is requested by medical team.  Discussed with daughter and husband by phone who both express that in lucid moments patient has expressed desire to move forward with tube placement.  She is in pain related to rolling/moving for patient care.  Per RN, requiring maximal pain management presumably due to L2 lesion.  NPO p MN.  Hold lovenox .  Contrast delievered to room with verbal instructions provided to RN. Orders are in place to instill contrast this afternoon/evening  with NPO/hold TFs at MN.  KUB ordered.   Risks and benefits image guided gastrostomy tube placement was discussed with the patient's husband including, but not limited to the need for a barium enema during the procedure, bleeding, infection, peritonitis and/or damage to adjacent structures.  All  questions were answered, patient is agreeable to proceed.  Consent signed and in chart.   Thank you for this interesting consult.  I greatly enjoyed meeting Brenda [name] and look forward to participating in their care.  A copy of this report was sent to the requesting provider on this date.  Electronically Signed: Mackie Holness Sue-Ellen Bentleigh Waren, PA 09/07/2024, 5:14 PM   I spent a total of 40 Minutes    in face to face in clinical consultation, greater than 50% of which was counseling/coordinating care for dysphagia.   "

## 2024-09-07 NOTE — Inpatient Diabetes Management (Signed)
 Inpatient Diabetes Program Recommendations  AACE/ADA: New Consensus Statement on Inpatient Glycemic Control (2015)  Target Ranges:  Prepandial:   less than 140 mg/dL      Peak postprandial:   less than 180 mg/dL (1-2 hours)      Critically ill patients:  140 - 180 mg/dL   Lab Results  Component Value Date   GLUCAP 118 (H) 09/07/2024   HGBA1C 7.2 (H) 06/01/2024    Review of Glycemic Control  Latest Reference Range & Units 09/07/24 04:05 09/07/24 07:34 09/07/24 08:19 09/07/24 11:49  Glucose-Capillary 70 - 99 mg/dL 96 68 (L) 829 (H) 881 (H)   Diabetes history: DM 2 Outpatient Diabetes medications:  Fiasp  1-3 units tid with meals Tresiba  18 units daily Metformin  1000 mg bid Current orders for Inpatient glycemic control:  Novolog  0-9 units q 4 hours Semglee  15 units bid  Inpatient Diabetes Program Recommendations:    Note feeds off- mild low CBG this AM.  May need slight reduction in Semglee .   Thanks,  Randall Bullocks, RN, BC-ADM Inpatient Diabetes Coordinator Pager [phone]  (8a-5p)

## 2024-09-08 ENCOUNTER — Inpatient Hospital Stay (HOSPITAL_COMMUNITY)

## 2024-09-08 DIAGNOSIS — J9611 Chronic respiratory failure with hypoxia: Secondary | ICD-10-CM | POA: Diagnosis not present

## 2024-09-08 DIAGNOSIS — G9341 Metabolic encephalopathy: Secondary | ICD-10-CM | POA: Diagnosis not present

## 2024-09-08 DIAGNOSIS — N390 Urinary tract infection, site not specified: Secondary | ICD-10-CM | POA: Diagnosis not present

## 2024-09-08 DIAGNOSIS — G893 Neoplasm related pain (acute) (chronic): Secondary | ICD-10-CM | POA: Diagnosis not present

## 2024-09-08 HISTORY — PX: IR GASTROSTOMY TUBE MOD SED: IMG625

## 2024-09-08 LAB — TYPE AND SCREEN
ABO/RH(D): O POS
Antibody Screen: NEGATIVE
Unit division: 0

## 2024-09-08 LAB — GLUCOSE, CAPILLARY
Glucose-Capillary: 115 mg/dL — ABNORMAL HIGH (ref 70–99)
Glucose-Capillary: 107 mg/dL — ABNORMAL HIGH (ref 70–99)
Glucose-Capillary: 144 mg/dL — ABNORMAL HIGH (ref 70–99)
Glucose-Capillary: 92 mg/dL (ref 70–99)
Glucose-Capillary: 73 mg/dL (ref 70–99)
Glucose-Capillary: 62 mg/dL — ABNORMAL LOW (ref 70–99)
Glucose-Capillary: 102 mg/dL — ABNORMAL HIGH (ref 70–99)

## 2024-09-08 LAB — CBC WITH DIFFERENTIAL/PLATELET
Abs Immature Granulocytes: 0.04 K/uL (ref 0.00–0.07)
Basophils Absolute: 0 K/uL (ref 0.0–0.1)
Basophils Relative: 1 %
Eosinophils Absolute: 0.1 K/uL (ref 0.0–0.5)
Eosinophils Relative: 1 %
HCT: 33.3 % — ABNORMAL LOW (ref 36.0–46.0)
Hemoglobin: 10.8 g/dL — ABNORMAL LOW (ref 12.0–15.0)
Immature Granulocytes: 1 %
Lymphocytes Relative: 11 %
Lymphs Abs: 0.8 K/uL (ref 0.7–4.0)
MCH: 30.9 pg (ref 26.0–34.0)
MCHC: 32.4 g/dL (ref 30.0–36.0)
MCV: 95.4 fL (ref 80.0–100.0)
Monocytes Absolute: 0.6 K/uL (ref 0.1–1.0)
Monocytes Relative: 7 %
Neutro Abs: 6.1 K/uL (ref 1.7–7.7)
Neutrophils Relative %: 79 %
Platelets: 293 K/uL (ref 150–400)
RBC: 3.49 MIL/uL — ABNORMAL LOW (ref 3.87–5.11)
RDW: 17.9 % — ABNORMAL HIGH (ref 11.5–15.5)
WBC: 7.6 K/uL (ref 4.0–10.5)
nRBC: 0 % (ref 0.0–0.2)

## 2024-09-08 LAB — BPAM RBC
Blood Product Expiration Date: 202601312359
ISSUE DATE / TIME: 202601051444
Unit Type and Rh: 5100

## 2024-09-08 MED ORDER — METHYLPHENIDATE HCL 5 MG PO TABS
5.0000 mg | ORAL_TABLET | Freq: Two times a day (BID) | ORAL | Status: DC
  Administered 2024-09-08 – 2024-09-14 (×12): 5 mg
  Filled 2024-09-08 (×13): qty 1

## 2024-09-08 MED ORDER — OXYCODONE HCL 5 MG PO TABS
2.5000 mg | ORAL_TABLET | ORAL | Status: AC | PRN
  Administered 2024-09-26 – 2024-10-02 (×2): 2.5 mg
  Filled 2024-09-08 (×3): qty 1

## 2024-09-08 MED ORDER — MIDAZOLAM HCL (PF) 2 MG/2ML IJ SOLN
INTRAMUSCULAR | Status: AC | PRN
  Administered 2024-09-08 (×3): 1 mg via INTRAVENOUS

## 2024-09-08 MED ORDER — THROMBIN FOR PERCUTANEOUS TREATMENT OF PSEUDOANEURYSM (5000UNITS/10ML)
Freq: Once | PERCUTANEOUS | Status: DC
  Filled 2024-09-08: qty 1

## 2024-09-08 MED ORDER — MIDAZOLAM HCL 2 MG/2ML IJ SOLN
INTRAMUSCULAR | Status: AC
  Filled 2024-09-08: qty 2

## 2024-09-08 MED ORDER — FENTANYL CITRATE (PF) 100 MCG/2ML IJ SOLN
INTRAMUSCULAR | Status: AC
  Filled 2024-09-08: qty 2

## 2024-09-08 MED ORDER — POLYETHYLENE GLYCOL 3350 17 G PO PACK
17.0000 g | PACK | Freq: Every day | ORAL | Status: AC
  Administered 2024-09-15 – 2024-10-09 (×16): 17 g
  Filled 2024-09-08 (×18): qty 1

## 2024-09-08 MED ORDER — CEFAZOLIN SODIUM-DEXTROSE 2-4 GM/100ML-% IV SOLN
INTRAVENOUS | Status: AC | PRN
  Administered 2024-09-08: 2 g via INTRAVENOUS

## 2024-09-08 MED ORDER — FREE WATER
100.0000 mL | Status: DC
  Administered 2024-09-08 – 2024-09-14 (×37): 100 mL

## 2024-09-08 MED ORDER — OXYCODONE HCL 5 MG PO TABS
5.0000 mg | ORAL_TABLET | ORAL | Status: AC | PRN
  Administered 2024-09-09 – 2024-10-09 (×50): 5 mg
  Filled 2024-09-08 (×48): qty 1

## 2024-09-08 MED ORDER — DIPHENHYDRAMINE HCL 50 MG/ML IJ SOLN
INTRAMUSCULAR | Status: AC
  Filled 2024-09-08: qty 1

## 2024-09-08 MED ORDER — GELATIN ABSORBABLE 12-7 MM EX MISC
CUTANEOUS | Status: AC
  Filled 2024-09-08: qty 2

## 2024-09-08 MED ORDER — DIPHENHYDRAMINE HCL 50 MG/ML IJ SOLN
INTRAMUSCULAR | Status: AC | PRN
  Administered 2024-09-08: 50 mg via INTRAVENOUS

## 2024-09-08 MED ORDER — LIDOCAINE-EPINEPHRINE 1 %-1:100000 IJ SOLN
INTRAMUSCULAR | Status: AC
  Filled 2024-09-08: qty 20

## 2024-09-08 MED ORDER — DEXTROSE 50 % IV SOLN
12.5000 g | INTRAVENOUS | Status: AC
  Administered 2024-09-08: 12.5 g via INTRAVENOUS
  Filled 2024-09-08: qty 50

## 2024-09-08 MED ORDER — CEFAZOLIN SODIUM-DEXTROSE 2-4 GM/100ML-% IV SOLN
INTRAVENOUS | Status: AC
  Filled 2024-09-08: qty 100

## 2024-09-08 MED ORDER — FENTANYL CITRATE (PF) 100 MCG/2ML IJ SOLN
INTRAMUSCULAR | Status: AC | PRN
  Administered 2024-09-08: 25 ug via INTRAVENOUS
  Administered 2024-09-08 (×2): 50 ug via INTRAVENOUS

## 2024-09-08 MED ORDER — SENNOSIDES-DOCUSATE SODIUM 8.6-50 MG PO TABS: 1.0000 | ORAL_TABLET | Freq: Every evening | ORAL | Status: DC | PRN

## 2024-09-08 MED ORDER — OXYCODONE HCL 5 MG PO TABS
5.0000 mg | ORAL_TABLET | Freq: Two times a day (BID) | ORAL | Status: AC
  Administered 2024-09-08 – 2024-10-09 (×61): 5 mg
  Filled 2024-09-08 (×64): qty 1

## 2024-09-08 MED ORDER — LIDOCAINE-EPINEPHRINE 1 %-1:100000 IJ SOLN
20.0000 mL | Freq: Once | INTRAMUSCULAR | Status: AC
  Administered 2024-09-08: 20 mL via INTRADERMAL

## 2024-09-08 MED ORDER — BUSPIRONE HCL 10 MG PO TABS
5.0000 mg | ORAL_TABLET | Freq: Three times a day (TID) | ORAL | Status: DC
  Administered 2024-09-08: 5 mg

## 2024-09-08 MED ORDER — IOHEXOL 300 MG/ML  SOLN
50.0000 mL | Freq: Once | INTRAMUSCULAR | Status: AC | PRN
  Administered 2024-09-08: 20 mL

## 2024-09-08 MED ORDER — HYDROMORPHONE HCL 1 MG/ML IJ SOLN
0.5000 mg | INTRAMUSCULAR | Status: DC | PRN
  Administered 2024-09-08 – 2024-09-16 (×27): 0.5 mg via INTRAVENOUS
  Filled 2024-09-08 (×27): qty 0.5

## 2024-09-08 MED ORDER — TRAZODONE HCL 50 MG PO TABS
25.0000 mg | ORAL_TABLET | Freq: Every evening | ORAL | Status: DC | PRN
  Administered 2024-09-09 – 2024-10-08 (×26): 25 mg
  Filled 2024-09-08 (×28): qty 1

## 2024-09-08 NOTE — Progress Notes (Signed)
 Brenda [name] was sleeping this morning.  I was talking to her daughter.  She is going to have a PEG tube placed for nutrition.  I think that she may benefit from a another speech pathology evaluation with regards to swallowing.  It really would be nice to see if she would be able to swallow and eat again.  I wonder if Ritalin  may not be a bad idea for her.  This could certainly keep her a little more alert.  I do not think she has been hurting.  I know that Palliative Care has been following quite closely.  She did get a unit of blood yesterday.  There are no labs this morning.  I talked to the daughter a little bit about any further treatment.  It is really hard to say whether we will be able to treat her again.  She seems to have responded however to the chemotherapy that she had been receiving.  As such, you want to make sure that we give her a chance as much as possible.  However, we really need to balance the benefit and risk of treatment.  Currently, she is certainly in no condition to be able to tolerate any type of treatment.  Her vital signs show temperature 98.5.  Pulse 65.  Blood pressure 115/55.  Her head and neck exam shows no adenopathy.  Her lungs sound pretty clear bilaterally.  She has good air movement bilaterally.  Cardiac exam is regular rate and rhythm.  Abdomen is soft.  Bowel sounds might be little bit decreased.  There is no guarding or obvious rebound tenderness.  Extremities shows no clubbing, cyanosis or edema.  Neurological exam shows no focal deficits.  Brenda [name] still has quite a ways to go I think before we could consider any additional therapy for her endometrial cancer.  Nutrition is critical.  We will see about the PEG tube and then hopefully getting the feeding tube out of the nose.  I know this will make her life little bit easier.  I just think that some Ritalin  may not be a bad idea for her.  Again this may improve her energy.  May allow her to be a little  more alert.  I know that she is getting great care from everybody over on 3 W.   Jeralyn Crease, MD  Ila 60:1

## 2024-09-08 NOTE — Progress Notes (Addendum)
 [facility]  PROGRESS NOTE  Brenda [name] FMW:[id number] DOB: [birth year] DOA: 07/15/2024 PCP: [facility]   Brief HPI:    64 y.o. female with PMH significant for metastatic endometrial cancer, chronic cancer-related pain, DM2, HTN, HLD, obesity, OSA, CAD/stent, CHF, CVA, h/o DVT and PE anticoagulated on Lovenox , chronic hypoxic respiratory failure on 2L via nasal cannula. Recently hospitalized 11/9 -11/11 for UTI, treated with IV antibiotics and discharged on oral antibiotics. Next day 11/12, patient presented to [facility] ED with chills and weakness.   She was found to have UTI and was started on IV cefepime .   Unfortunately mental status worsened  11/15, patient was noted to have a stiffening of upper extremities, she was transferred to [facility] for continuous EEG monitoring. 11/16, she was noted to have seizures and video EEG. She was started on Keppra . Meningitis/encephalitis was suspected and patient was started on empiric antibiotics.  CSF panel unremarkable and hence antibiotics were stopped  12/7, because of development of rigidity, neurology and oncology were reconsulted.  Palliative care consulted as well for cancer-related pain      Assessment/Plan:    Seizures Workup as above.  Currently on Keppra . Seizure precautions   Acute metabolic encephalopathy  Generalized rigidity Patient had significant altered mental status and generalized rigidity. Multiple possible etiologies were considered.   Neurology and oncology consult appreciated.  Cefepime  was discontinued.  Prozac  was discontinued.  EEG did not show epileptiform abnormalities.  CT head without acute intracranial abnormality.  MRI brain showed evolving infarcts. MRI C spine did not show any evidence of metastatic disease.  Other possible etiologies considered were - neurotoxicity from Keytruda  which was being used to treat her cancer.  Keytruda  was stopped.  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 S/p high dose steroids 12/9-12/11.  S/p trial sinimet.  S/p baclofen .   S/p 5 days of PLEX, completed on 12/28  Per neurology note from 12/29, likely favor an autoimmune process.  Mental status also influenced probably by pain regimen as well as hospital induced delirium.   Overall, patient's mental status seems to be gradually improving.  Neurology has signed off. Oncology and palliative care following. Family wants to continue aggressive care. Status post PEG tube placement today  Anxiety - Continue Ativan  1 mg IV every 4 hours as needed   Metastatic Endometrial Cancer Cancer Related Pain  Seen by oncology and palliative care. She had had multiple imagings done during this hospitalization.  Per oncology note, there is no clear evidence of progression of the metastatic carcinoma. Keytruda  on hold as explained above. Pain regimen --- Scheduled: Oxycodone  5 mg every 8 hours. Reduced to BID today. --- PRN: IV Dilaudid , oxycodone , Tylenol     Recent DVT/PE,Splenic Infarct In October 2025, patient was diagnosed to have right leg DVT, PE as well as a splenic infarct and she was started on anticoagulation.   This hospitalization, anticoagulation was held due to psoas hematoma and GI bleed.   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   Patient had BRBPR during this hospitalization BRBPR resolved at this time CT GI bleed study did not show any evidence GI bleed. Patient was given 1 unit of PRBC transfusion on 12/1.  Also given IV Venofer  -Hemoglobin was 8.1 yesterday, status post 1 unit PRBC per hematology yesterday, today hemoglobin is 10.8 Anticoagulation on hold. On DVT prophylaxis with Lovenox   Klebsiella Pneumoniae UTI Completed 5-day course of antibiotics    Dysphagia Moderate malnutrition With altered mental status, patient was not able to  to take oral intake for several days.  She is now eating some but very minimal and not enough to sustain her nutrition.  She has been receiving tube feeds through [employer] therapist and dietitian following.  IR consulted for PEG tube placement Plan for PEG tube placement on 09/08/2024; will discontinue core track feeding tube   Type 2 diabetes mellitus A1c 7.2 on 06/01/2024 Currently on Lantus  15 units twice daily, insulin  aspart SSI every 4 hours  Continue to monitor  Hypertension 1/1, held previously ordered midodrine  5 mg twice daily and propranolol  10 mg 3 times daily  Blood pressure target less than 150 systolic.  Continue to monitor   Depression/anxiety Prozac  discontinued due to rigidity   H/o CAD, Stroke HLD Currently on Lipitor  80 mg daily.  Anticoagulation on hold as discussed above   Abnormal Thyroid  Function Tests It seems early in the course of hospitalization, TSH level was low and free T4 level was elevated at 1.41 on 11/16 Patient was started on propranolol  20 mg 3 times daily.  Subsequent repeat TSH level normal at 0.9 on 12/7 Propranolol  has been intermittently held because of low blood pressure.  1/1,  stopped propranolol .      DVT prophylaxis Lovenox   Medications     atorvastatin   80 mg Per Tube QHS   busPIRone   5 mg Oral TID   calcium  gluconate  2 g Intravenous Once   Chlorhexidine  Gluconate Cloth  6 each Topical Daily   enoxaparin  (LOVENOX ) injection  40 mg Subcutaneous Q24H   free water   75 mL Per Tube Q4H   gabapentin   100 mg Per Tube BID   heparin  sodium (porcine)  1,000 Units Intracatheter Once   insulin  aspart  0-9 Units Subcutaneous Q4H   insulin  glargine-yfgn  15 Units Subcutaneous BID   levETIRAcetam   500 mg Intravenous Q12H   lidocaine   1 patch Transdermal Daily   methylphenidate   5 mg Per Tube BID WC   nystatin   100,000 Units Topical TID   mouth rinse  15 mL Mouth Rinse 4 times per day   oxyCODONE   5 mg Oral BID   pantoprazole   (PROTONIX ) IV  40 mg Intravenous Q12H   polyethylene glycol  17 g Oral Daily   sodium chloride  flush  10-40 mL Intracatheter Q12H   thrombin  5,000 units for percutaneous treatment of pseudoaneurysm   Percutaneous Once     Data Reviewed:   CBG:  Recent Labs  Lab 09/07/24 1607 09/07/24 1930 09/07/24 2330 09/08/24 0314 09/08/24 0733  GLUCAP 128* 114* 99 115* 107*    SpO2: 96 % O2 Flow Rate (L/min): 2 L/min FiO2 (%): 21 %    Vitals:   09/08/24 0850 09/08/24 0855 09/08/24 0900 09/08/24 0907  BP: (!) 148/76 (!) 160/65 (!) 141/63 (!) 128/59  Pulse: 93 84 84 85  Resp: 20 17 20 19   Temp:      TempSrc:      SpO2: 97% 96% 98% 96%  Weight:      Height:          Data Reviewed:  Basic Metabolic Panel: Recent Labs  Lab 09/07/24 0420  NA 140  K 4.0  CL 104  CO2 32  GLUCOSE 93  BUN 12  CREATININE 0.30*  CALCIUM  8.8*    CBC: Recent Labs  Lab 09/02/24 0332 09/07/24 0420  WBC  4.4 4.1  HGB 7.9* 8.1*  HCT 25.1* 26.0*  MCV 97.7 97.7  PLT 210 281    LFT Recent Labs  Lab 09/07/24 0420  AST 13*  ALT 16  ALKPHOS 77  BILITOT 0.2  PROT 5.0*  ALBUMIN  3.2*     Antibiotics: Anti-infectives (From admission, onward)    Start     Dose/Rate Route Frequency Ordered Stop   09/08/24 0831  ceFAZolin  (ANCEF ) IVPB 2g/100 mL premix        over 30 Minutes Intravenous Continuous PRN 09/08/24 0831     08/16/24 1100  cefTRIAXone  (ROCEPHIN ) 2 g in sodium chloride  0.9 % 100 mL IVPB        2 g 200 mL/hr over 30 Minutes Intravenous Every 24 hours 08/16/24 1001 08/20/24 1214   07/23/24 0600  vancomycin  (VANCOCIN ) IVPB 1000 mg/200 mL premix  Status:  Discontinued        1,000 mg 200 mL/hr over 60 Minutes Intravenous Every 12 hours 07/22/24 1834 07/24/24 1808   07/22/24 2200  meropenem  (MERREM ) 2 g in sodium chloride  0.9 % 100 mL IVPB  Status:  Discontinued        2 g 280 mL/hr over 30 Minutes Intravenous Every 8 hours 07/22/24 1601 07/24/24 1808   07/22/24 1700  vancomycin   (VANCOREADY) IVPB 1500 mg/300 mL        1,500 mg 150 mL/hr over 120 Minutes Intravenous  Once 07/22/24 1601 07/22/24 2031   07/22/24 1645  ampicillin  (OMNIPEN) 2 g in sodium chloride  0.9 % 100 mL IVPB  Status:  Discontinued        2 g 300 mL/hr over 20 Minutes Intravenous Every 6 hours 07/22/24 1557 07/24/24 1800   07/19/24 0530  meropenem  (MERREM ) 1 g in sodium chloride  0.9 % 100 mL IVPB  Status:  Discontinued        1 g 200 mL/hr over 30 Minutes Intravenous Every 8 hours 07/19/24 0438 07/22/24 1601   07/16/24 0900  ceFEPIme  (MAXIPIME ) 2 g in sodium chloride  0.9 % 100 mL IVPB  Status:  Discontinued        2 g 200 mL/hr over 30 Minutes Intravenous Every 8 hours 07/16/24 0811 07/19/24 0409   07/16/24 0815  ceFEPIme  (MAXIPIME ) 2 g in sodium chloride  0.9 % 100 mL IVPB  Status:  Discontinued        2 g 200 mL/hr over 30 Minutes Intravenous  Once 07/16/24 0803 07/16/24 0811   07/16/24 0330  cefTRIAXone  (ROCEPHIN ) 2 g in sodium chloride  0.9 % 100 mL IVPB        2 g 200 mL/hr over 30 Minutes Intravenous  Once 07/16/24 0315 07/16/24 0412        CONSULTS neurology, oncology  Code Status: DNR  Family Communication: No family at bedside     Subjective   Patient seen and examined.  Status post PEG tube placement today.   Objective    Physical Examination:   Appears in no acute distress S1-S2, regular, no murmur auscultated Alert, answering questions appropriately Abdomen is soft, nontender, no organomegaly     Wound 07/25/24 1100 Pressure Injury Sacrum Medial Deep Tissue Pressure Injury - Purple or maroon localized area of discolored intact skin or blood-filled blister due to damage of underlying soft tissue from pressure and/or shear. (Active)     Wound 08/11/24 0415 Pressure Injury Vertebral column Medial Stage 2 -  Partial thickness loss of dermis presenting as a shallow open injury with a red, pink wound bed  without slough. (Active)     Wound 08/31/24 2100 Pressure  Injury Thigh Left;Posterior Stage 1 -  Intact skin with non-blanchable redness of a localized area usually over a bony prominence. (Active)        Sabas GORMAN Brod   [facility] If 7PM-7AM, please contact night-coverage at [email or web address], Office  [phone]   09/08/2024, 9:25 AM  LOS: 53 days

## 2024-09-08 NOTE — Progress Notes (Signed)
 Spoke to the patient's husband this AM Archie Gin). He would like patient to be full code temporarily during the gastrostomy placement in IR today, and then DNR status to be reinstated after. IR sedation RN aware.   Marcine Gadway NP 09/08/2024 8:12 AM

## 2024-09-08 NOTE — Plan of Care (Signed)
" °  Problem: Fluid Volume: Goal: Ability to maintain a balanced intake and output will improve Outcome: Progressing   Problem: Skin Integrity: Goal: Risk for impaired skin integrity will decrease Outcome: Progressing   Problem: Tissue Perfusion: Goal: Adequacy of tissue perfusion will improve Outcome: Progressing   Problem: Clinical Measurements: Goal: Ability to maintain clinical measurements within normal limits will improve Outcome: Progressing Goal: Will remain free from infection Outcome: Progressing Goal: Diagnostic test results will improve Outcome: Progressing Goal: Respiratory complications will improve Outcome: Progressing Goal: Cardiovascular complication will be avoided Outcome: Progressing   "

## 2024-09-08 NOTE — Progress Notes (Signed)
 Nutrition Follow-up  DOCUMENTATION CODES:  Non-severe (moderate) malnutrition in context of chronic illness  INTERVENTION:  When PEG cleared for use, remove NGT and utilize PEG for medications and nutrition: Glucerna 1.5 at 45 ml/h (1080 ml per day) Free water : 100 mL every 4 hours  Provides 1620 kcal, 89 gm protein, 820 ml free water  daily ( fee water  TF+flush) Consider adjusting to bolus feeds when able for decreased CBG checks Continue current diet as ordered, encourage PO intake Monitor GOC discussions with PMT  NUTRITION DIAGNOSIS:  Moderate Malnutrition related to chronic illness (cancer) as evidenced by percent weight loss, mild muscle depletion, moderate muscle depletion (12% x 6 months). - remains applicable  GOAL:  Patient will meet greater than or equal to 90% of their needs - being addressed with tube feeds  MONITOR:  Weight trends, TF tolerance, I & O's, Labs, Diet advancement  REASON FOR ASSESSMENT:  Consult Enteral/tube feeding initiation and management  ASSESSMENT:  Pt with hx of HTN, HLD, CAD, CHF, hx CVA, uterine cancer s/p hysterectomy and adjuvant radiation therapy in 2023 with recurrance with mets in July 2025, hx MI x 3, and DM type 1 presented to [facility] ED with weakness after a recent admission from 11/9-11/11. Pt had seizures during admission and was transferred to [facility] for continuous EEG.  11/13 - presented to [facility] with chills and weakness after recent admission to [facility] 11/15 - transferred to [facility] 11/19 - cortrak placed (gastric) 12/2 - SLP BSE, NPO 12/4 - MBS, DYS1, thin liquids 12/9 - SLP BSE, regular diet to promote PO intake 12/16 - TF on hold for GI bleed, changed to glucerna 1.5 when able to restart 12/19 - first PLEX treatment (planned for 5) 12/27 - fifth (final) PLEX treatment 1/2 - Family agreed to [facility] placement, IR consulted 1/6 - PEG placement in IR  Pt resting in bed at the time of assessment.  Just returned from IR having PEG placed and RN working on reconnecting to pumps and medications administration.    Pt calling out continuously that she was hurting. Discussed nutrition plan with RN, PEG able to be used at 2pm. Will discontinue cortrak at that time and restart feeds. Daughter at bedside - no questions at the moment.  Admit weight: 77.9 kg Current weight: 84.5 kg   12% weight loss noted in the last 6 months to admission (since cancer recurrence) which is severe for timeframe  Nutritionally Relevant Medications: Scheduled Meds:  atorvastatin   80 mg Per Tube QHS   calcium  gluconate  2 g Intravenous Once   free water   75 mL Per Tube Q4H   insulin  aspart  0-9 Units Subcutaneous Q4H   insulin  glargine-yfgn  15 Units Subcutaneous BID   levETIRAcetam   500 mg Intravenous Q12H   methylphenidate   5 mg Per Tube BID WC   pantoprazole    40 mg Intravenous Q12H   polyethylene glycol  17 g Oral Daily   Continuous Infusions:  citrate dextrose      feeding supplement (GLUCERNA 1.5 CAL) Stopped (09/07/24 2345)   PRN Meds: diphenhydrAMINE , ondansetron , prochlorperazine , senna-docusate  Labs Reviewed: CBG ranges from 68-170 mg/dL over the last 24 hours HgbA1c 7.2% (06/01/24)  NUTRITION - FOCUSED PHYSICAL EXAM: Flowsheet Row Most Recent Value  Orbital Region No depletion  Upper Arm Region No depletion  Thoracic and Lumbar Region No depletion  Buccal Region No depletion  Temple Region Mild depletion  Clavicle Bone Region Mild depletion  Clavicle and Acromion  Bone Region Mild depletion  Scapular Bone Region Mild depletion  Dorsal Hand No depletion  Patellar Region Moderate depletion  Anterior Thigh Region Moderate depletion  Posterior Calf Region Moderate depletion  Edema (RD Assessment) Moderate  [BLE, BUE]  Hair Reviewed  Eyes Reviewed  Mouth Reviewed  Skin Reviewed  Nails Reviewed    Diet Order:   Diet Order             Diet NPO time specified  Diet effective midnight                    EDUCATION NEEDS:  Education needs have been addressed  Skin:  Skin Assessment: Reviewed RN Assessment Stage 1:  - left thigh (0.5 x 0.5 cm)  Last BM:  1/6 - type 6  Height:  Ht Readings from Last 1 Encounters:  08/21/24 5' 2 (1.575 m)    Weight:  Wt Readings from Last 1 Encounters:  09/08/24 84.5 kg    Ideal Body Weight:  50 kg  BMI:  Body mass index is 34.07 kg/m.  Estimated Nutritional Needs:  Kcal:  1600-1800 kcal/d Protein:  75-90 g/d Fluid:  >/=1.6L/d    Vernell Lukes, RD, LDN, CNSC Registered Dietitian II Please reach out via secure chat

## 2024-09-08 NOTE — Progress Notes (Signed)
 SLP Cancellation Note  Patient Details Name: Brenda [name] MRN: [id number] DOB: 17-Apr-1961   Cancelled treatment:        SLP planned to see patient for bedside swallow evaluation, however patient NPO and out of room for PEG placement. Will follow up as able.    Rosina LABOR Katja Blue 09/08/2024, 8:44 AM

## 2024-09-08 NOTE — Plan of Care (Signed)
" °  Problem: Health Behavior/Discharge Planning: Goal: Ability to manage health-related needs will improve Outcome: Progressing   Problem: Nutritional: Goal: Maintenance of adequate nutrition will improve Outcome: Progressing   Problem: Nutritional: Goal: Progress toward achieving an optimal weight will improve Outcome: Progressing   Problem: Skin Integrity: Goal: Risk for impaired skin integrity will decrease Outcome: Progressing   Problem: Clinical Measurements: Goal: Will remain free from infection Outcome: Progressing   Problem: Clinical Measurements: Goal: Diagnostic test results will improve Outcome: Progressing   Problem: Clinical Measurements: Goal: Respiratory complications will improve Outcome: Progressing   Problem: Activity: Goal: Risk for activity intolerance will decrease Outcome: Progressing   Problem: Coping: Goal: Level of anxiety will decrease Outcome: Progressing   Problem: Elimination: Goal: Will not experience complications related to bowel motility Outcome: Progressing   Problem: Pain Managment: Goal: General experience of comfort will improve and/or be controlled Outcome: Progressing   Problem: Safety: Goal: Ability to remain free from injury will improve Outcome: Progressing   Problem: Skin Integrity: Goal: Risk for impaired skin integrity will decrease Outcome: Progressing   "

## 2024-09-08 NOTE — Procedures (Signed)
 Interventional Radiology Procedure Note  Procedure: Placement of percutaneous 50F balloon-retention gastrostomy tube.  Complications: None  EBL:  < 5 mL  Recommendations: - NPO for 4 hours after placement - Maintain G-tube to low wall suction for 4 hours after placement - May advance diet as tolerated and begin using tube 4 hours after placement - Gastropexy sutures are absorbable and do not require removal  Marliss Coots, MD Pager: [phone]

## 2024-09-09 DIAGNOSIS — J9611 Chronic respiratory failure with hypoxia: Secondary | ICD-10-CM | POA: Diagnosis not present

## 2024-09-09 DIAGNOSIS — Z66 Do not resuscitate: Secondary | ICD-10-CM | POA: Diagnosis not present

## 2024-09-09 DIAGNOSIS — N390 Urinary tract infection, site not specified: Secondary | ICD-10-CM | POA: Diagnosis not present

## 2024-09-09 DIAGNOSIS — Z515 Encounter for palliative care: Secondary | ICD-10-CM | POA: Diagnosis not present

## 2024-09-09 DIAGNOSIS — E870 Hyperosmolality and hypernatremia: Secondary | ICD-10-CM | POA: Diagnosis not present

## 2024-09-09 DIAGNOSIS — D649 Anemia, unspecified: Secondary | ICD-10-CM | POA: Diagnosis not present

## 2024-09-09 DIAGNOSIS — G9341 Metabolic encephalopathy: Secondary | ICD-10-CM | POA: Diagnosis not present

## 2024-09-09 DIAGNOSIS — R29898 Other symptoms and signs involving the musculoskeletal system: Secondary | ICD-10-CM | POA: Diagnosis not present

## 2024-09-09 DIAGNOSIS — G893 Neoplasm related pain (acute) (chronic): Secondary | ICD-10-CM | POA: Diagnosis not present

## 2024-09-09 DIAGNOSIS — F418 Other specified anxiety disorders: Secondary | ICD-10-CM | POA: Diagnosis not present

## 2024-09-09 LAB — GLUCOSE, CAPILLARY
Glucose-Capillary: 109 mg/dL — ABNORMAL HIGH (ref 70–99)
Glucose-Capillary: 118 mg/dL — ABNORMAL HIGH (ref 70–99)
Glucose-Capillary: 159 mg/dL — ABNORMAL HIGH (ref 70–99)
Glucose-Capillary: 161 mg/dL — ABNORMAL HIGH (ref 70–99)
Glucose-Capillary: 197 mg/dL — ABNORMAL HIGH (ref 70–99)
Glucose-Capillary: 211 mg/dL — ABNORMAL HIGH (ref 70–99)
Glucose-Capillary: 282 mg/dL — ABNORMAL HIGH (ref 70–99)

## 2024-09-09 LAB — CBC WITH DIFFERENTIAL/PLATELET
Abs Immature Granulocytes: 0.04 K/uL (ref 0.00–0.07)
Basophils Absolute: 0 K/uL (ref 0.0–0.1)
Basophils Relative: 0 %
Eosinophils Absolute: 0.2 K/uL (ref 0.0–0.5)
Eosinophils Relative: 2 %
HCT: 32.9 % — ABNORMAL LOW (ref 36.0–46.0)
Hemoglobin: 10.5 g/dL — ABNORMAL LOW (ref 12.0–15.0)
Immature Granulocytes: 1 %
Lymphocytes Relative: 15 %
Lymphs Abs: 1.1 K/uL (ref 0.7–4.0)
MCH: 30.5 pg (ref 26.0–34.0)
MCHC: 31.9 g/dL (ref 30.0–36.0)
MCV: 95.6 fL (ref 80.0–100.0)
Monocytes Absolute: 0.7 K/uL (ref 0.1–1.0)
Monocytes Relative: 9 %
Neutro Abs: 5.2 K/uL (ref 1.7–7.7)
Neutrophils Relative %: 73 %
Platelets: 284 K/uL (ref 150–400)
RBC: 3.44 MIL/uL — ABNORMAL LOW (ref 3.87–5.11)
RDW: 17.2 % — ABNORMAL HIGH (ref 11.5–15.5)
WBC: 7.2 K/uL (ref 4.0–10.5)
nRBC: 0 % (ref 0.0–0.2)

## 2024-09-09 LAB — URINE CULTURE: Culture: 80000 — AB

## 2024-09-09 LAB — COMPREHENSIVE METABOLIC PANEL WITH GFR
ALT: 14 U/L (ref 0–44)
AST: 15 U/L (ref 15–41)
Albumin: 3.4 g/dL — ABNORMAL LOW (ref 3.5–5.0)
Alkaline Phosphatase: 91 U/L (ref 38–126)
Anion gap: 7 (ref 5–15)
BUN: 9 mg/dL (ref 8–23)
CO2: 30 mmol/L (ref 22–32)
Calcium: 9.1 mg/dL (ref 8.9–10.3)
Chloride: 103 mmol/L (ref 98–111)
Creatinine, Ser: 0.33 mg/dL — ABNORMAL LOW (ref 0.44–1.00)
GFR, Estimated: >60 mL/min
Glucose, Bld: 148 mg/dL — ABNORMAL HIGH (ref 70–99)
Potassium: 4 mmol/L (ref 3.5–5.1)
Sodium: 140 mmol/L (ref 135–145)
Total Bilirubin: 0.4 mg/dL (ref 0.0–1.2)
Total Protein: 5.6 g/dL — ABNORMAL LOW (ref 6.5–8.1)

## 2024-09-09 LAB — PREALBUMIN: Prealbumin: 9 mg/dL — ABNORMAL LOW (ref 18–38)

## 2024-09-09 MED ORDER — FENTANYL 25 MCG/HR TD PT72
1.0000 | MEDICATED_PATCH | TRANSDERMAL | Status: AC
  Administered 2024-09-09 – 2024-10-09 (×11): 1 via TRANSDERMAL
  Filled 2024-09-09 (×11): qty 1

## 2024-09-09 MED ORDER — FLUOXETINE HCL 20 MG PO CAPS
40.0000 mg | ORAL_CAPSULE | Freq: Every day | ORAL | Status: AC
  Administered 2024-09-09 – 2024-10-09 (×31): 40 mg
  Filled 2024-09-09 (×31): qty 2

## 2024-09-09 MED ORDER — SODIUM CHLORIDE 0.9 % IV SOLN
2.0000 g | INTRAVENOUS | Status: AC
  Administered 2024-09-09 – 2024-09-12 (×4): 2 g via INTRAVENOUS
  Filled 2024-09-09 (×5): qty 20

## 2024-09-09 MED ORDER — ACETAMINOPHEN 160 MG/5ML PO SOLN
650.0000 mg | Freq: Four times a day (QID) | ORAL | Status: DC | PRN
  Administered 2024-09-09 – 2024-09-14 (×7): 650 mg
  Filled 2024-09-09 (×7): qty 20.3

## 2024-09-09 MED ORDER — ACETAMINOPHEN 325 MG PO TABS: 650.0000 mg | ORAL_TABLET | Freq: Four times a day (QID) | ORAL | Status: DC | PRN

## 2024-09-09 NOTE — Plan of Care (Signed)
 Twana an episode of Hypoglycemia 62 mg/dl, given D50 based on Hypoglycemia protocol. Latest CBG is 118 mg/dl.  Problem: Health Behavior/Discharge Planning: Goal: Ability to manage health-related needs will improve Outcome: Progressing   Problem: Nutritional: Goal: Maintenance of adequate nutrition will improve Outcome: Progressing   Problem: Skin Integrity: Goal: Risk for impaired skin integrity will decrease Outcome: Progressing   Problem: Health Behavior/Discharge Planning: Goal: Ability to manage health-related needs will improve Outcome: Progressing   Problem: Clinical Measurements: Goal: Will remain free from infection Outcome: Progressing   Problem: Activity: Goal: Risk for activity intolerance will decrease Outcome: Progressing   Problem: Coping: Goal: Level of anxiety will decrease Outcome: Progressing   Problem: Elimination: Goal: Will not experience complications related to bowel motility Outcome: Progressing   Problem: Pain Managment: Goal: General experience of comfort will improve and/or be controlled Outcome: Progressing   Problem: Safety: Goal: Ability to remain free from injury will improve Outcome: Progressing   Problem: Skin Integrity: Goal: Risk for impaired skin integrity will decrease Outcome: Progressing

## 2024-09-09 NOTE — Progress Notes (Signed)
 [facility]  PROGRESS NOTE  Brenda [name] FMW:[id number] DOB: 12-Sep-1960 DOA: 07/15/2024 PCP: [facility]   Brief HPI:    64 y.o. female with PMH significant for metastatic endometrial cancer, chronic cancer-related pain, DM2, HTN, HLD, obesity, OSA, CAD/stent, CHF, CVA, h/o DVT and PE anticoagulated on Lovenox , chronic hypoxic respiratory failure on 2L via nasal cannula. Recently hospitalized 11/9 -11/11 for UTI, treated with IV antibiotics and discharged on oral antibiotics. Next day 11/12, patient presented to [facility] ED with chills and weakness.   She was found to have UTI and was started on IV cefepime .   Unfortunately mental status worsened  11/15, patient was noted to have a stiffening of upper extremities, she was transferred to [facility] for continuous EEG monitoring. 11/16, she was noted to have seizures and video EEG. She was started on Keppra . Meningitis/encephalitis was suspected and patient was started on empiric antibiotics.  CSF panel unremarkable and hence antibiotics were stopped  12/7, because of development of rigidity, neurology and oncology were reconsulted.  Palliative care consulted as well for cancer-related pain      Assessment/Plan:    Seizures Workup as above.  Currently on Keppra . Seizure precautions   Acute metabolic encephalopathy  Generalized rigidity Patient had significant altered mental status and generalized rigidity. Multiple possible etiologies were considered.   Neurology and oncology consult appreciated.  Cefepime  was discontinued.  Prozac  was discontinued.  EEG did not show epileptiform abnormalities.  CT head without acute intracranial abnormality.  MRI brain showed evolving infarcts. MRI C spine did not show any evidence of metastatic disease.  Other possible etiologies considered were - neurotoxicity from Keytruda  which was being used to treat her cancer.  Keytruda  was stopped.  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 S/p high dose steroids 12/9-12/11.  S/p trial sinimet.  S/p baclofen .   S/p 5 days of PLEX, completed on 12/28  Per neurology note from 12/29, likely favor an autoimmune process.  Mental status also influenced probably by pain regimen as well as hospital induced delirium.   Overall, patient's mental status seems to be gradually improving.  Neurology has signed off. Oncology and palliative care following. Family wants to continue aggressive care. Status post PEG tube placement on 09/08/2024  Anxiety - Continue Ativan  1 mg IV every 4 hours as needed   Metastatic Endometrial Cancer Cancer Related Pain  Seen by oncology and palliative care. She had had multiple imagings done during this hospitalization.  Per oncology note, there is no clear evidence of progression of the metastatic carcinoma. Keytruda  on hold as explained above. Pain regimen --- Scheduled: Oxycodone  5 mg every 8 hours. Reduced to BID today. --- PRN: IV Dilaudid , oxycodone , Tylenol     Recent DVT/PE,Splenic Infarct In October 2025, patient was diagnosed to have right leg DVT, PE as well as a splenic infarct and she was started on anticoagulation.   This hospitalization, anticoagulation was held due to psoas hematoma and GI bleed.   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   Patient had BRBPR during this hospitalization BRBPR resolved at this time CT GI bleed study did not show any evidence GI bleed. Patient was given 1 unit of PRBC transfusion on 12/1.  Also given IV Venofer  -Hemoglobin was 8.1 yesterday, status post 1 unit PRBC per hematology yesterday, today hemoglobin is 10.8 Anticoagulation on hold. On DVT prophylaxis with Lovenox   Recurrent UTI  - Patient had Klebsiella pneumonia UTI in December which was treated with antibiotics for 5 days  - Had low-grade fever, UA was  abnormal, urine culture again obtained and has grown Klebsiella pneumoniae -Started on ceftriaxone  2 g IV daily -Will treat with antibiotics for 5 days   Dysphagia Moderate malnutrition With altered mental status, patient was not able to to take oral intake for several days.  She is now eating some but very minimal and not enough to sustain her nutrition.  She has been receiving tube feeds through [employer] therapist and dietitian following.  IR consulted for PEG tube placement PEG tube placed on 09/08/2024   Type 2 diabetes mellitus A1c 7.2 on 06/01/2024 Currently on Lantus  15 units twice daily, insulin  aspart SSI every 4 hours  Continue to monitor  Hypertension 1/1, held previously ordered midodrine  5 mg twice daily and propranolol  10 mg 3 times daily  Blood pressure target less than 150 systolic.  Continue to monitor   Depression/anxiety Prozac  discontinued due to rigidity -Continue Ativan  as needed   H/o CAD, Stroke HLD Currently on Lipitor  80 mg daily.  Anticoagulation on hold as discussed above   Abnormal Thyroid  Function Tests It seems early in the course of hospitalization, TSH level was low and free T4 level was elevated at 1.41 on 11/16 Patient was started on propranolol  20 mg 3 times daily.  Subsequent repeat TSH level normal at 0.9 on 12/7 Propranolol  has been intermittently held because of low blood pressure.  1/1,  stopped propranolol .      DVT prophylaxis Lovenox   Medications     atorvastatin   80 mg Per Tube QHS   Chlorhexidine  Gluconate Cloth  6 each Topical Daily   enoxaparin  (LOVENOX ) injection  40 mg Subcutaneous Q24H   FLUoxetine   40 mg Per Tube Daily   free water   100 mL Per Tube Q4H   gabapentin   100 mg Per Tube BID   insulin  aspart  0-9 Units Subcutaneous Q4H   insulin  glargine-yfgn  15 Units Subcutaneous BID   levETIRAcetam   500 mg Intravenous Q12H   lidocaine   1 patch Transdermal Daily   methylphenidate   5 mg Per Tube BID WC   nystatin    100,000 Units Topical TID   mouth rinse  15 mL Mouth Rinse 4 times per day   oxyCODONE   5 mg Per Tube BID   pantoprazole  (PROTONIX ) IV  40 mg Intravenous Q12H   polyethylene glycol  17 g Per Tube Daily   sodium chloride  flush  10-40 mL Intracatheter Q12H   thrombin  5,000 units for percutaneous treatment of pseudoaneurysm   Percutaneous Once     Data Reviewed:   CBG:  Recent Labs  Lab 09/08/24 2205 09/08/24 2245 09/09/24 0013 09/09/24 0347 09/09/24 0739  GLUCAP 62* 102* 109* 118* 161*    SpO2: 99 % O2 Flow Rate (L/min): 2 L/min FiO2 (%): 21 %    Vitals:   09/08/24 2007 09/08/24 2333 09/09/24 0421 09/09/24 0736  BP: (!) 146/60 (!) 141/61 (!) 109/54 (!) 143/67  Pulse: 69 81 67 85  Resp: 18 18 18 18   Temp: 98.1 F (36.7 C) 97.8 F (36.6 C) 98.5 F (36.9 C) 100.2 F (37.9 C)  TempSrc: Oral Oral Axillary Axillary  SpO2: 100% 100% 100% 99%  Weight:      Height:          Data Reviewed:  Basic Metabolic Panel: Recent Labs  Lab 09/07/24 0420 09/09/24 0600  NA 140  140  K 4.0 4.0  CL 104 103  CO2 32 30  GLUCOSE 93 148*  BUN 12 9  CREATININE 0.30* 0.33*  CALCIUM  8.8* 9.1    CBC: Recent Labs  Lab 09/07/24 0420 09/08/24 1056 09/09/24 0600  WBC 4.1 7.6 7.2  NEUTROABS  --  6.1 5.2  HGB 8.1* 10.8* 10.5*  HCT 26.0* 33.3* 32.9*  MCV 97.7 95.4 95.6  PLT 281 293 284    LFT Recent Labs  Lab 09/07/24 0420 09/09/24 0600  AST 13* 15  ALT 16 14  ALKPHOS 77 91  BILITOT 0.2 0.4  PROT 5.0* 5.6*  ALBUMIN  3.2* 3.4*     Antibiotics: Anti-infectives (From admission, onward)    Start     Dose/Rate Route Frequency Ordered Stop   09/09/24 0800  cefTRIAXone  (ROCEPHIN ) 2 g in sodium chloride  0.9 % 100 mL IVPB        2 g 200 mL/hr over 30 Minutes Intravenous Every 24 hours 09/09/24 0624 09/13/24 0759   09/08/24 0831  ceFAZolin  (ANCEF ) IVPB 2g/100 mL premix        over 30 Minutes Intravenous Continuous PRN 09/08/24 0831 09/08/24 0856   08/16/24 1100   cefTRIAXone  (ROCEPHIN ) 2 g in sodium chloride  0.9 % 100 mL IVPB        2 g 200 mL/hr over 30 Minutes Intravenous Every 24 hours 08/16/24 1001 08/20/24 1214   07/23/24 0600  vancomycin  (VANCOCIN ) IVPB 1000 mg/200 mL premix  Status:  Discontinued        1,000 mg 200 mL/hr over 60 Minutes Intravenous Every 12 hours 07/22/24 1834 07/24/24 1808   07/22/24 2200  meropenem  (MERREM ) 2 g in sodium chloride  0.9 % 100 mL IVPB  Status:  Discontinued        2 g 280 mL/hr over 30 Minutes Intravenous Every 8 hours 07/22/24 1601 07/24/24 1808   07/22/24 1700  vancomycin  (VANCOREADY) IVPB 1500 mg/300 mL        1,500 mg 150 mL/hr over 120 Minutes Intravenous  Once 07/22/24 1601 07/22/24 2031   07/22/24 1645  ampicillin  (OMNIPEN) 2 g in sodium chloride  0.9 % 100 mL IVPB  Status:  Discontinued        2 g 300 mL/hr over 20 Minutes Intravenous Every 6 hours 07/22/24 1557 07/24/24 1800   07/19/24 0530  meropenem  (MERREM ) 1 g in sodium chloride  0.9 % 100 mL IVPB  Status:  Discontinued        1 g 200 mL/hr over 30 Minutes Intravenous Every 8 hours 07/19/24 0438 07/22/24 1601   07/16/24 0900  ceFEPIme  (MAXIPIME ) 2 g in sodium chloride  0.9 % 100 mL IVPB  Status:  Discontinued        2 g 200 mL/hr over 30 Minutes Intravenous Every 8 hours 07/16/24 0811 07/19/24 0409   07/16/24 0815  ceFEPIme  (MAXIPIME ) 2 g in sodium chloride  0.9 % 100 mL IVPB  Status:  Discontinued        2 g 200 mL/hr over 30 Minutes Intravenous  Once 07/16/24 0803 07/16/24 0811   07/16/24 0330  cefTRIAXone  (ROCEPHIN ) 2 g in sodium chloride  0.9 % 100 mL IVPB        2 g 200 mL/hr over 30 Minutes Intravenous  Once 07/16/24 0315 07/16/24 0412        CONSULTS neurology, oncology  Code Status: DNR  Family Communication: No family at bedside     Subjective   Patient seen and examined, status post PEG tube placement yesterday.  Denies any complaints.  Alert and answering questions appropriately   Objective    Physical  Examination:   Appears in no acute distress Answering questions appropriately. S1-S2, regular Lungs are clear to auscultation bilaterally Abdomen is soft, nontender, no organomegaly     Wound 07/25/24 1100 Pressure Injury Sacrum Medial Deep Tissue Pressure Injury - Purple or maroon localized area of discolored intact skin or blood-filled blister due to damage of underlying soft tissue from pressure and/or shear. (Active)     Wound 08/11/24 0415 Pressure Injury Vertebral column Medial Stage 2 -  Partial thickness loss of dermis presenting as a shallow open injury with a red, pink wound bed without slough. (Active)     Wound 08/31/24 2100 Pressure Injury Thigh Left;Posterior Stage 1 -  Intact skin with non-blanchable redness of a localized area usually over a bony prominence. (Active)        Sabas GORMAN Brod   [facility] If 7PM-7AM, please contact night-coverage at [email or web address], Office  [phone]   09/09/2024, 9:43 AM  LOS: 54 days

## 2024-09-09 NOTE — Progress Notes (Addendum)
 Physical Therapy Treatment Patient Details Name: Brenda [name] MRN: [id number] DOB: 25-Jul-1961 Today's Date: 09/09/2024   History of Present Illness 64 y.o. F admitted 07/15/24 for acute metabolic encephalopathy. Pt presented wtih ongoing s/sx of UTI, weakness, and generalized rigidity. Pt with 4 seizures on EEG 11/16, c/f cefepime  toxicity. C-spine imaging unremarkable for acute process. CSF PCR (-) meningitis/encephalitis. MRI brain 12/7 demonstrated multifocally evolving infarcts in the anterior and posterior circulation with developing encephalomalacia and resolving restricted diffusion. 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 Pt s/p high dose steroids 12/9-12/11; S/p trial sinimet; S/p baclofen ; S/p 5 days of PLEX, completed on 12/28. PMHx: HTN, HLD, T1DM, depression/anxiety, CVA, DVT/PE, OSA on CPAP, chronic respiratory failure (2L O2 at baseline), and metastatic endometrial cancer with chronic cancer-related pain. Of note, recent admission 11/9-11/11 for UTI, treated with IV antibiotics and d/c'd on oral antibiotics.    PT Comments  Pt with fair tolerance to treatment today. Pt today was able to transfer to chair via Maximove. +2 total A for rolling. Pt screaming out in pain for 95% of session. Given pt current state, I don't foresee pt making any meaningful progress with skilled therapy at this time. PT will sign off again. Pt daughter present and provided with PROM exercise handout. PROM exercises on pt were deferred due to pt screaming out in pain however pt daughter reports that she has been performing PROM of ankle dorsiflexion and wrist flexion/extension. Recommend nursing staff utilize [facility] to get pt in chair frequently. Re consult PT if mobility status changes.     If plan is discharge home, recommend the following: Two people to help with walking and/or transfers;Two people to help with  bathing/dressing/bathroom;Assistance with feeding;Direct supervision/assist for medications management;Direct supervision/assist for financial management;Assist for transportation;Supervision due to cognitive status;Help with stairs or ramp for entrance   Can travel by private vehicle     No  Equipment Recommendations  Hospital bed;Hoyer lift;Other (comment)    Recommendations for Other Services       Precautions / Restrictions Precautions Precautions: Fall;Other (comment) Recall of Precautions/Restrictions: Impaired Precaution/Restrictions Comments: PEG Restrictions Weight Bearing Restrictions Per Provider Order: No     Mobility  Bed Mobility Overal bed mobility: Needs Assistance Bed Mobility: Rolling Rolling: Total assist, +2 for physical assistance         General bed mobility comments: +2 total A to roll to place maximove pad.    Transfers Overall transfer level: Needs assistance Equipment used: Ambulation equipment used Transfers: Bed to chair/wheelchair/BSC             General transfer comment: Maximove to chair. Pt screaming throughout session. Transfer via Lift Equipment: Maximove  Ambulation/Gait               General Gait Details: NT   Stairs             Wheelchair Mobility     Tilt Bed    Modified Rankin (Stroke Patients Only)       Balance Overall balance assessment: Needs assistance Sitting-balance support: Feet supported, Bilateral upper extremity supported Sitting balance-Leahy Scale: Zero Sitting balance - Comments: Zero sitting balance noted in chair. R lateral lean corrected by pillows. Postural control: Right lateral lean                                  Communication Communication Communication: Impaired Factors Affecting Communication:  Difficulty expressing self  Cognition Arousal: Lethargic Behavior During Therapy: Flat affect   PT - Cognitive impairments: Difficult to assess Difficult to  assess due to: Level of arousal                     PT - Cognition Comments: Pt kept eyes closed for majority of session however would open eyes upon command and respond somewhat appropriately to questions. Repeatedly calling out to family members who were not there. Following commands: Impaired Following commands impaired: Follows one step commands inconsistently, Follows one step commands with increased time    Cueing Cueing Techniques: Verbal cues, Tactile cues  Exercises      General Comments General comments (skin integrity, edema, etc.): Daughter present and given PROM handout.      Pertinent Vitals/Pain Pain Assessment Pain Assessment: Faces Faces Pain Scale: Hurts whole lot Pain Location: Generalized with movement Pain Descriptors / Indicators: Grimacing, Moaning Pain Intervention(s): Limited activity within patient's tolerance, Monitored during session, Repositioned, Premedicated before session    Home Living                          Prior Function            PT Goals (current goals can now be found in the care plan section) Progress towards PT goals: Not progressing toward goals - comment    Frequency    Min 1X/week      PT Plan      Co-evaluation              AM-PAC PT 6 Clicks Mobility   Outcome Measure  Help needed turning from your back to your side while in a flat bed without using bedrails?: Total Help needed moving from lying on your back to sitting on the side of a flat bed without using bedrails?: Total Help needed moving to and from a bed to a chair (including a wheelchair)?: Total Help needed standing up from a chair using your arms (e.g., wheelchair or bedside chair)?: Total Help needed to walk in hospital room?: Total Help needed climbing 3-5 steps with a railing? : Total 6 Click Score: 6    End of Session Equipment Utilized During Treatment: Oxygen  Activity Tolerance: Patient limited by lethargy;Patient  limited by fatigue;Patient limited by pain Patient left: in chair;with call bell/phone within reach;with family/visitor present Nurse Communication: Mobility status;Need for lift equipment PT Visit Diagnosis: Other abnormalities of gait and mobility (R26.89);Muscle weakness (generalized) (M62.81);Unsteadiness on feet (R26.81);History of falling (Z91.81);Pain     Time: 8851-8781 PT Time Calculation (min) (ACUTE ONLY): 30 min  Charges:    $Therapeutic Activity: 23-37 mins PT General Charges $$ ACUTE PT VISIT: 1 Visit                     Sueellen NOVAK, PT, DPT Acute Rehab Services [phone]    Kipper Buch 09/09/2024, 4:07 PM

## 2024-09-09 NOTE — TOC Progression Note (Signed)
 Transition of Care [facility]) - Progression Note    Patient Details  Name: Brenda [name] MRN: [id number] Date of Birth: 06/01/1961  [facility] of [facility]) CM/SW Contact  [name], [place] Phone Number: 09/09/2024, 3:09 PM  Clinical Narrative:   CSW continuing to follow for disposition. Noting patient had peg tube placed, but still requiring IV medicine for pain and anxiety, on IV antibiotics. Awaiting medical stability for possible SNF placement, pending continued goals of care. CSW to follow.    Expected Discharge Plan:  (TBD) Barriers to Discharge: Continued Medical Work up               Expected Discharge Plan and Services                                               Social Drivers of Health (SDOH) Interventions SDOH Screenings   Food Insecurity: No Food Insecurity (07/16/2024)  Recent Concern: Food Insecurity - Food Insecurity Present (04/20/2024)  Housing: Low Risk (07/16/2024)  Transportation Needs: No Transportation Needs (07/16/2024)  Utilities: Not At Risk (07/16/2024)  Depression (PHQ2-9): Low Risk (06/30/2024)  Recent Concern: Depression (PHQ2-9) - Medium Risk (05/07/2024)  Social Connections: Moderately Integrated (07/13/2024)  Tobacco Use: Medium Risk (09/07/2024)    Readmission Risk Interventions    07/14/2024    2:34 PM 05/04/2024   10:00 AM  Readmission Risk Prevention Plan  Transportation Screening Complete Complete  PCP or Specialist Appt within 3-5 Days  Complete  HRI or Home Care Consult  Complete  Social Work Consult for Recovery Care Planning/Counseling  Complete  Palliative Care Screening  Not Applicable  Medication Review Oceanographer) Complete Complete  PCP or Specialist appointment within 3-5 days of discharge Complete   HRI or Home Care Consult Complete   SW Recovery Care/Counseling Consult Complete   Palliative Care Screening Not Applicable   Skilled Nursing Facility Not Applicable

## 2024-09-09 NOTE — Progress Notes (Addendum)
 [name] is more alert this morning.  It was nice talking to her little bit.  It is even better that the feeding tube is out of her nose.  She has a PEG tube and now.  She is getting PEG feeds.  I think that speech pathology will come by again and assess her for swallowing.  It would be nice if she would be able to take an oral nutrition.  She still having some discomfort.  I wonder if a Duragesic  patch may not be a bad idea for her.  She had a CBC yesterday.  Was ago 7.6.  Hemoglobin 10.8.  Platelet count 292,000.  She had been transfused with the unit of blood on Monday.  She has had no obvious bleeding.  She had a urine culture done on the 09/07/2024.  This is growing Klebsiella.  Of note, back in December, she also had Klebsiella in her urine.  We probably need to get her on some antibiotics.  I will have to check these sensitivities.  I suspect this will have the same sensitivities as a Klebsiella back in December.  Her vital signs show temperature of 98.5.  Pulse 67.  Blood pressure 109/54.  Her oral exam is slightly dry.  She has no oral mucositis.  Lungs are clear bilaterally.  Cardiac exam regular rate and rhythm.  She has no murmurs.  Abdomen is soft.  She has a PEG tube in.  Bowel sounds are slightly decreased.  Extremity shows no clubbing, cyanosis or edema.  Neurological exam shows no focal neurological deficits.  Hopefully, some progress is being made.  It is nice that the Cortrak tube has been removed.  I know she has a PEG tube in.  She is getting tube feeds which really will help her.  Again we have to get her on antibiotics to help prevent this Klebsiella from becoming more of a problem.  Again, as far as any further chemotherapy, this is going to be a difficult decision.  I know her last set of scans show that she was responding.  I does not sure that her overall physical state is good be able to tolerate treatment right now.  I know that she is getting great care from  everybody on [address].  Jeralyn Crease, MD  1 Cor 10:31

## 2024-09-09 NOTE — Plan of Care (Signed)
 Problem: Education: Goal: Ability to describe self-care measures that may prevent or decrease complications (Diabetes Survival Skills Education) will improve Outcome: Not Progressing Goal: Individualized Educational Video(s) Outcome: Not Progressing   Problem: Coping: Goal: Ability to adjust to condition or change in health will improve Outcome: Not Progressing   Problem: Fluid Volume: Goal: Ability to maintain a balanced intake and output will improve Outcome: Not Progressing   Problem: Health Behavior/Discharge Planning: Goal: Ability to identify and utilize available resources and services will improve Outcome: Not Progressing Goal: Ability to manage health-related needs will improve Outcome: Not Progressing   Problem: Metabolic: Goal: Ability to maintain appropriate glucose levels will improve Outcome: Not Progressing   Problem: Nutritional: Goal: Maintenance of adequate nutrition will improve Outcome: Not Progressing Goal: Progress toward achieving an optimal weight will improve Outcome: Not Progressing   Problem: Skin Integrity: Goal: Risk for impaired skin integrity will decrease Outcome: Not Progressing   Problem: Tissue Perfusion: Goal: Adequacy of tissue perfusion will improve Outcome: Not Progressing   Problem: Education: Goal: Knowledge of General Education information will improve Description: Including pain rating scale, medication(s)/side effects and non-pharmacologic comfort measures Outcome: Not Progressing   Problem: Health Behavior/Discharge Planning: Goal: Ability to manage health-related needs will improve Outcome: Not Progressing   Problem: Clinical Measurements: Goal: Ability to maintain clinical measurements within normal limits will improve Outcome: Not Progressing Goal: Will remain free from infection Outcome: Not Progressing Goal: Diagnostic test results will improve Outcome: Not Progressing Goal: Respiratory complications will  improve Outcome: Not Progressing Goal: Cardiovascular complication will be avoided Outcome: Not Progressing   Problem: Activity: Goal: Risk for activity intolerance will decrease Outcome: Not Progressing   Problem: Nutrition: Goal: Adequate nutrition will be maintained Outcome: Not Progressing   Problem: Coping: Goal: Level of anxiety will decrease Outcome: Not Progressing   Problem: Elimination: Goal: Will not experience complications related to bowel motility Outcome: Not Progressing Goal: Will not experience complications related to urinary retention Outcome: Not Progressing   Problem: Pain Managment: Goal: General experience of comfort will improve and/or be controlled Outcome: Not Progressing   Problem: Safety: Goal: Ability to remain free from injury will improve Outcome: Not Progressing   Problem: Skin Integrity: Goal: Risk for impaired skin integrity will decrease Outcome: Not Progressing   Problem: Education: Goal: Knowledge of General Education information will improve Description: Including pain rating scale, medication(s)/side effects and non-pharmacologic comfort measures Outcome: Not Progressing   Problem: Health Behavior/Discharge Planning: Goal: Ability to manage health-related needs will improve Outcome: Not Progressing   Problem: Clinical Measurements: Goal: Ability to maintain clinical measurements within normal limits will improve Outcome: Not Progressing Goal: Will remain free from infection Outcome: Not Progressing Goal: Diagnostic test results will improve Outcome: Not Progressing Goal: Respiratory complications will improve Outcome: Not Progressing Goal: Cardiovascular complication will be avoided Outcome: Not Progressing   Problem: Activity: Goal: Risk for activity intolerance will decrease Outcome: Not Progressing   Problem: Nutrition: Goal: Adequate nutrition will be maintained Outcome: Not Progressing   Problem:  Coping: Goal: Level of anxiety will decrease Outcome: Not Progressing   Problem: Elimination: Goal: Will not experience complications related to bowel motility Outcome: Not Progressing Goal: Will not experience complications related to urinary retention Outcome: Not Progressing   Problem: Pain Managment: Goal: General experience of comfort will improve and/or be controlled Outcome: Not Progressing   Problem: Safety: Goal: Ability to remain free from injury will improve Outcome: Not Progressing   Problem: Skin Integrity: Goal: Risk for impaired skin integrity  will decrease Outcome: Not Progressing

## 2024-09-10 ENCOUNTER — Inpatient Hospital Stay (HOSPITAL_COMMUNITY)

## 2024-09-10 DIAGNOSIS — G893 Neoplasm related pain (acute) (chronic): Secondary | ICD-10-CM | POA: Diagnosis not present

## 2024-09-10 DIAGNOSIS — Z515 Encounter for palliative care: Secondary | ICD-10-CM | POA: Diagnosis not present

## 2024-09-10 DIAGNOSIS — G4733 Obstructive sleep apnea (adult) (pediatric): Secondary | ICD-10-CM | POA: Diagnosis not present

## 2024-09-10 DIAGNOSIS — Z66 Do not resuscitate: Secondary | ICD-10-CM | POA: Diagnosis not present

## 2024-09-10 DIAGNOSIS — G9341 Metabolic encephalopathy: Secondary | ICD-10-CM | POA: Diagnosis not present

## 2024-09-10 DIAGNOSIS — F418 Other specified anxiety disorders: Secondary | ICD-10-CM | POA: Diagnosis not present

## 2024-09-10 DIAGNOSIS — C541 Malignant neoplasm of endometrium: Secondary | ICD-10-CM | POA: Diagnosis not present

## 2024-09-10 DIAGNOSIS — J9611 Chronic respiratory failure with hypoxia: Secondary | ICD-10-CM | POA: Diagnosis not present

## 2024-09-10 DIAGNOSIS — N390 Urinary tract infection, site not specified: Secondary | ICD-10-CM | POA: Diagnosis not present

## 2024-09-10 LAB — GLUCOSE, CAPILLARY
Glucose-Capillary: 119 mg/dL — ABNORMAL HIGH (ref 70–99)
Glucose-Capillary: 142 mg/dL — ABNORMAL HIGH (ref 70–99)
Glucose-Capillary: 176 mg/dL — ABNORMAL HIGH (ref 70–99)
Glucose-Capillary: 180 mg/dL — ABNORMAL HIGH (ref 70–99)
Glucose-Capillary: 197 mg/dL — ABNORMAL HIGH (ref 70–99)
Glucose-Capillary: 226 mg/dL — ABNORMAL HIGH (ref 70–99)

## 2024-09-10 MED ORDER — BUSPIRONE HCL 5 MG PO TABS
5.0000 mg | ORAL_TABLET | Freq: Two times a day (BID) | ORAL | Status: AC
  Administered 2024-09-10 – 2024-10-09 (×60): 5 mg via ORAL
  Filled 2024-09-10 (×60): qty 1

## 2024-09-10 MED ORDER — ENOXAPARIN SODIUM 40 MG/0.4ML IJ SOSY
40.0000 mg | PREFILLED_SYRINGE | INTRAMUSCULAR | Status: AC
  Administered 2024-09-10 – 2024-10-09 (×30): 40 mg via SUBCUTANEOUS
  Filled 2024-09-10 (×30): qty 0.4

## 2024-09-10 MED ORDER — DICLOFENAC SODIUM 1 % EX GEL
2.0000 g | Freq: Four times a day (QID) | CUTANEOUS | Status: AC
  Administered 2024-09-10 – 2024-09-11 (×7): 2 g via TOPICAL
  Filled 2024-09-10: qty 100

## 2024-09-10 NOTE — Progress Notes (Signed)
 [facility]  PROGRESS NOTE  Brenda [name] FMW:[id number] DOB: [birth year] DOA: 07/15/2024 PCP: [facility]   Brief HPI:    64 y.o. female with PMH significant for metastatic endometrial cancer, chronic cancer-related pain, DM2, HTN, HLD, obesity, OSA, CAD/stent, CHF, CVA, h/o DVT and PE anticoagulated on Lovenox , chronic hypoxic respiratory failure on 2L via nasal cannula. Recently hospitalized 11/9 -11/11 for UTI, treated with IV antibiotics and discharged on oral antibiotics. Next day 11/12, patient presented to [facility] ED with chills and weakness.   She was found to have UTI and was started on IV cefepime .   Unfortunately mental status worsened  11/15, patient was noted to have a stiffening of upper extremities, she was transferred to [facility] for continuous EEG monitoring. 11/16, she was noted to have seizures and video EEG. She was started on Keppra . Meningitis/encephalitis was suspected and patient was started on empiric antibiotics.  CSF panel unremarkable and hence antibiotics were stopped  12/7, because of development of rigidity, neurology and oncology were reconsulted.  Palliative care consulted as well for cancer-related pain      Assessment/Plan:    Seizures Workup as above.  Currently on Keppra . Seizure precautions   Acute metabolic encephalopathy  Generalized rigidity Patient had significant altered mental status and generalized rigidity. Multiple possible etiologies were considered.   Neurology and oncology consult appreciated.  Cefepime  was discontinued.  Prozac  was discontinued.  EEG did not show epileptiform abnormalities.  CT head without acute intracranial abnormality.  MRI brain showed evolving infarcts. MRI C spine did not show any evidence of metastatic disease.  Other possible etiologies considered were - neurotoxicity from Keytruda  which was being used to treat her cancer.  Keytruda  was stopped.  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 S/p high dose steroids 12/9-12/11.  S/p trial sinimet.  S/p baclofen .   S/p 5 days of PLEX, completed on 12/28  Per neurology note from 12/29, likely favor an autoimmune process.  Mental status also influenced probably by pain regimen as well as hospital induced delirium.   Overall, patient's mental status seems to be gradually improving.  Neurology has signed off. Oncology and palliative care following. Family wants to continue aggressive care. Status post PEG tube placement on 09/08/2024  Anxiety - Continue Ativan  1 mg IV every 4 hours as needed -Will add BuSpar  5 mg p.o. twice daily for anxiety   Metastatic Endometrial Cancer Cancer Related Pain  Seen by oncology and palliative care. She had had multiple imagings done during this hospitalization.  Per oncology note, there is no clear evidence of progression of the metastatic carcinoma. Keytruda  on hold as explained above. Pain regimen --- Scheduled: Oxycodone  5 mg every 8 hours. Reduced to BID today. --- PRN: IV Dilaudid , oxycodone , Tylenol     Recent DVT/PE,Splenic Infarct In October 2025, patient was diagnosed to have right leg DVT, PE as well as a splenic infarct and she was started on anticoagulation.   This hospitalization, anticoagulation was held due to psoas hematoma and GI bleed.   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   Patient had BRBPR during this hospitalization BRBPR resolved at this time CT GI bleed study did not show any evidence GI bleed. Patient was given 1 unit of PRBC transfusion on 12/1.  Also given IV Venofer  -Hemoglobin was 8.1 yesterday, status post 1 unit PRBC per hematology yesterday, today hemoglobin is 10.8 Anticoagulation on hold. On DVT prophylaxis with  Lovenox    Recurrent UTI  - Patient had Klebsiella pneumonia UTI in December which was treated with antibiotics for 5 days  - Had low-grade fever, UA was abnormal, urine culture again obtained and has grown Klebsiella pneumoniae -Started on ceftriaxone  2 g IV daily -Will treat with antibiotics for 5 days   Dysphagia Moderate malnutrition With altered mental status, patient was not able to to take oral intake for several days.  She is now eating some but very minimal and not enough to sustain her nutrition.  She has been receiving tube feeds through [employer] therapist and dietitian following.  IR consulted for PEG tube placement PEG tube placed on 09/08/2024   Type 2 diabetes mellitus A1c 7.2 on 06/01/2024 Currently on Lantus  15 units twice daily, insulin  aspart SSI every 4 hours  Continue to monitor  Hypertension 1/1, held previously ordered midodrine  5 mg twice daily and propranolol  10 mg 3 times daily  Blood pressure target less than 150 systolic.  Continue to monitor   Depression/anxiety Prozac  discontinued due to rigidity -Continue Ativan  as needed   H/o CAD, Stroke HLD Currently on Lipitor  80 mg daily.  Anticoagulation on hold as discussed above   Abnormal Thyroid  Function Tests It seems early in the course of hospitalization, TSH level was low and free T4 level was elevated at 1.41 on 11/16 Patient was started on propranolol  20 mg 3 times daily.  Subsequent repeat TSH level normal at 0.9 on 12/7 Propranolol  has been intermittently held because of low blood pressure.  1/1,  stopped propranolol .      DVT prophylaxis Lovenox   Medications     atorvastatin   80 mg Per Tube QHS   Chlorhexidine  Gluconate Cloth  6 each Topical Daily   enoxaparin  (LOVENOX ) injection  40 mg Subcutaneous Q24H   fentaNYL   1 patch Transdermal Q72H   FLUoxetine   40 mg Per Tube Daily   free water   100 mL Per Tube Q4H   gabapentin   100 mg Per Tube BID   insulin  aspart  0-9 Units  Subcutaneous Q4H   insulin  glargine-yfgn  15 Units Subcutaneous BID   levETIRAcetam   500 mg Intravenous Q12H   lidocaine   1 patch Transdermal Daily   methylphenidate   5 mg Per Tube BID WC   nystatin   100,000 Units Topical TID   mouth rinse  15 mL Mouth Rinse 4 times per day   oxyCODONE   5 mg Per Tube BID   pantoprazole  (PROTONIX ) IV  40 mg Intravenous Q12H   polyethylene glycol  17 g Per Tube Daily   sodium chloride  flush  10-40 mL Intracatheter Q12H   thrombin  5,000 units for percutaneous treatment of pseudoaneurysm   Percutaneous Once     Data Reviewed:   CBG:  Recent Labs  Lab 09/09/24 1727 09/09/24 2002 09/09/24 2341 09/10/24 0321 09/10/24 0736  GLUCAP 282* 211* 159* 119* 176*    SpO2: 100 % O2 Flow Rate (L/min): 2 L/min FiO2 (%): 21 %    Vitals:   09/09/24 2001 09/09/24 2341 09/10/24 0321 09/10/24 0802  BP: 125/60 (!) 124/55 (!) 105/49 (!) 142/53  Pulse: 84 86 67 92  Resp: 18 18 18 19   Temp: 100.2 F (37.9 C) 98.5 F (36.9 C) 99.7 F (37.6 C) 98.6 F (37 C)  TempSrc: Oral Oral Oral   SpO2: 97%  100% 100% 100%  Weight:      Height:          Data Reviewed:  Basic Metabolic Panel: Recent Labs  Lab 09/07/24 0420 09/09/24 0600  NA 140 140  K 4.0 4.0  CL 104 103  CO2 32 30  GLUCOSE 93 148*  BUN 12 9  CREATININE 0.30* 0.33*  CALCIUM  8.8* 9.1    CBC: Recent Labs  Lab 09/07/24 0420 09/08/24 1056 09/09/24 0600  WBC 4.1 7.6 7.2  NEUTROABS  --  6.1 5.2  HGB 8.1* 10.8* 10.5*  HCT 26.0* 33.3* 32.9*  MCV 97.7 95.4 95.6  PLT 281 293 284    LFT Recent Labs  Lab 09/07/24 0420 09/09/24 0600  AST 13* 15  ALT 16 14  ALKPHOS 77 91  BILITOT 0.2 0.4  PROT 5.0* 5.6*  ALBUMIN  3.2* 3.4*     Antibiotics: Anti-infectives (From admission, onward)    Start     Dose/Rate Route Frequency Ordered Stop   09/09/24 0800  cefTRIAXone  (ROCEPHIN ) 2 g in sodium chloride  0.9 % 100 mL IVPB        2 g 200 mL/hr over 30 Minutes Intravenous Every 24 hours  09/09/24 0624 09/13/24 0759   09/08/24 0831  ceFAZolin  (ANCEF ) IVPB 2g/100 mL premix        over 30 Minutes Intravenous Continuous PRN 09/08/24 0831 09/08/24 0856   08/16/24 1100  cefTRIAXone  (ROCEPHIN ) 2 g in sodium chloride  0.9 % 100 mL IVPB        2 g 200 mL/hr over 30 Minutes Intravenous Every 24 hours 08/16/24 1001 08/20/24 1214   07/23/24 0600  vancomycin  (VANCOCIN ) IVPB 1000 mg/200 mL premix  Status:  Discontinued        1,000 mg 200 mL/hr over 60 Minutes Intravenous Every 12 hours 07/22/24 1834 07/24/24 1808   07/22/24 2200  meropenem  (MERREM ) 2 g in sodium chloride  0.9 % 100 mL IVPB  Status:  Discontinued        2 g 280 mL/hr over 30 Minutes Intravenous Every 8 hours 07/22/24 1601 07/24/24 1808   07/22/24 1700  vancomycin  (VANCOREADY) IVPB 1500 mg/300 mL        1,500 mg 150 mL/hr over 120 Minutes Intravenous  Once 07/22/24 1601 07/22/24 2031   07/22/24 1645  ampicillin  (OMNIPEN) 2 g in sodium chloride  0.9 % 100 mL IVPB  Status:  Discontinued        2 g 300 mL/hr over 20 Minutes Intravenous Every 6 hours 07/22/24 1557 07/24/24 1800   07/19/24 0530  meropenem  (MERREM ) 1 g in sodium chloride  0.9 % 100 mL IVPB  Status:  Discontinued        1 g 200 mL/hr over 30 Minutes Intravenous Every 8 hours 07/19/24 0438 07/22/24 1601   07/16/24 0900  ceFEPIme  (MAXIPIME ) 2 g in sodium chloride  0.9 % 100 mL IVPB  Status:  Discontinued        2 g 200 mL/hr over 30 Minutes Intravenous Every 8 hours 07/16/24 0811 07/19/24 0409   07/16/24 0815  ceFEPIme  (MAXIPIME ) 2 g in sodium chloride  0.9 % 100 mL IVPB  Status:  Discontinued        2 g 200 mL/hr over 30 Minutes Intravenous  Once 07/16/24 0803 07/16/24 0811   07/16/24 0330  cefTRIAXone  (ROCEPHIN ) 2 g in sodium chloride  0.9 % 100 mL IVPB        2 g 200 mL/hr over 30 Minutes Intravenous  Once 07/16/24 0315 07/16/24 0412  CONSULTS neurology, oncology  Code Status: DNR  Family Communication: Discussed with patient's daughter at  bedside     Subjective   Complains of pain in the right hand   Objective    Physical Examination:  Appears in no acute distress, communicating well, answering questions appropriately Extremities-right wrist is warm to touch, right hand is mildly swollen S1-S2, regular Lungs clear to auscultation bilaterally     Wound 07/25/24 1100 Pressure Injury Sacrum Medial Deep Tissue Pressure Injury - Purple or maroon localized area of discolored intact skin or blood-filled blister due to damage of underlying soft tissue from pressure and/or shear. (Active)     Wound 08/11/24 0415 Pressure Injury Vertebral column Medial Stage 2 -  Partial thickness loss of dermis presenting as a shallow open injury with a red, pink wound bed without slough. (Active)     Wound 08/31/24 2100 Pressure Injury Thigh Left;Posterior Stage 1 -  Intact skin with non-blanchable redness of a localized area usually over a bony prominence. (Active)        Sabas GORMAN Brod   [facility] If 7PM-7AM, please contact night-coverage at [email or web address], Office  [phone]   09/10/2024, 8:51 AM  LOS: 55 days

## 2024-09-10 NOTE — Progress Notes (Signed)
 Physical Therapy Discharge Patient Details Name: Larrisa Cravey MRN: [id number] DOB: 04-15-1961 Today's Date: 09/10/2024 Time:  -     Patient discharged from PT services secondary to patient has made no progress toward goals in a reasonable time frame.  Please see latest therapy progress note for current level of functioning and progress toward goals.    Progress and discharge plan discussed with patient and/or caregiver: Patient/Caregiver agrees with plan  GP     Nareg Breighner 09/10/2024, 2:29 PM

## 2024-09-10 NOTE — Progress Notes (Signed)
 Patient very anxious and in pain throughout the day despite PRN medication adminnistration for anxiety and pain. Patient repeatedly yelling out stating  I just want to die please let me die and stating I am sorry to put my children through this, but I just can't take this anymore When asked if patient felt this way because of their pain, patient endorsed I want to die because I can't take the pain  Patient was informed that PT wanted to help transfer her to the chair and asked if she was agreeable to this, which she was. Patient did work with PT around 1200 and was placed in chair via lift. Prior to transfer patient given PRN medications to help ease pain and anxiety associated with transfer. During initial time in chair patient yelled out less frequently and appeared more comfortable. Patient also given PRN's to alleviate pain and anxiety prior to transfer back to bed from chair. Patient still stating  I can't do this anymore and I just want to die Dr. Drusilla notified about patient condition throughout day and order given for duralgesic patch as was suggested in Dr. Jessy note.

## 2024-09-10 NOTE — Plan of Care (Signed)
 Problem: Education: Goal: Ability to describe self-care measures that may prevent or decrease complications (Diabetes Survival Skills Education) will improve Outcome: Not Progressing Goal: Individualized Educational Video(s) Outcome: Not Progressing   Problem: Coping: Goal: Ability to adjust to condition or change in health will improve Outcome: Not Progressing   Problem: Fluid Volume: Goal: Ability to maintain a balanced intake and output will improve Outcome: Not Progressing   Problem: Health Behavior/Discharge Planning: Goal: Ability to identify and utilize available resources and services will improve Outcome: Not Progressing Goal: Ability to manage health-related needs will improve Outcome: Not Progressing   Problem: Metabolic: Goal: Ability to maintain appropriate glucose levels will improve Outcome: Not Progressing   Problem: Nutritional: Goal: Maintenance of adequate nutrition will improve Outcome: Not Progressing Goal: Progress toward achieving an optimal weight will improve Outcome: Not Progressing   Problem: Skin Integrity: Goal: Risk for impaired skin integrity will decrease Outcome: Not Progressing   Problem: Tissue Perfusion: Goal: Adequacy of tissue perfusion will improve Outcome: Not Progressing   Problem: Education: Goal: Knowledge of General Education information will improve Description: Including pain rating scale, medication(s)/side effects and non-pharmacologic comfort measures Outcome: Not Progressing   Problem: Health Behavior/Discharge Planning: Goal: Ability to manage health-related needs will improve Outcome: Not Progressing   Problem: Clinical Measurements: Goal: Ability to maintain clinical measurements within normal limits will improve Outcome: Not Progressing Goal: Will remain free from infection Outcome: Not Progressing Goal: Diagnostic test results will improve Outcome: Not Progressing Goal: Respiratory complications will  improve Outcome: Not Progressing Goal: Cardiovascular complication will be avoided Outcome: Not Progressing   Problem: Activity: Goal: Risk for activity intolerance will decrease Outcome: Not Progressing   Problem: Nutrition: Goal: Adequate nutrition will be maintained Outcome: Not Progressing   Problem: Coping: Goal: Level of anxiety will decrease Outcome: Not Progressing   Problem: Elimination: Goal: Will not experience complications related to bowel motility Outcome: Not Progressing Goal: Will not experience complications related to urinary retention Outcome: Not Progressing   Problem: Pain Managment: Goal: General experience of comfort will improve and/or be controlled Outcome: Not Progressing   Problem: Safety: Goal: Ability to remain free from injury will improve Outcome: Not Progressing   Problem: Skin Integrity: Goal: Risk for impaired skin integrity will decrease Outcome: Not Progressing   Problem: Education: Goal: Knowledge of General Education information will improve Description: Including pain rating scale, medication(s)/side effects and non-pharmacologic comfort measures Outcome: Not Progressing   Problem: Health Behavior/Discharge Planning: Goal: Ability to manage health-related needs will improve Outcome: Not Progressing   Problem: Clinical Measurements: Goal: Ability to maintain clinical measurements within normal limits will improve Outcome: Not Progressing Goal: Will remain free from infection Outcome: Not Progressing Goal: Diagnostic test results will improve Outcome: Not Progressing Goal: Respiratory complications will improve Outcome: Not Progressing Goal: Cardiovascular complication will be avoided Outcome: Not Progressing   Problem: Activity: Goal: Risk for activity intolerance will decrease Outcome: Not Progressing   Problem: Nutrition: Goal: Adequate nutrition will be maintained Outcome: Not Progressing   Problem:  Coping: Goal: Level of anxiety will decrease Outcome: Not Progressing   Problem: Elimination: Goal: Will not experience complications related to bowel motility Outcome: Not Progressing Goal: Will not experience complications related to urinary retention Outcome: Not Progressing   Problem: Pain Managment: Goal: General experience of comfort will improve and/or be controlled Outcome: Not Progressing   Problem: Safety: Goal: Ability to remain free from injury will improve Outcome: Not Progressing   Problem: Skin Integrity: Goal: Risk for impaired skin integrity  will decrease Outcome: Not Progressing

## 2024-09-10 NOTE — Progress Notes (Signed)
 Mrs. Brenda [name] was quite agitated this morning.  She seemed to wake up this way.  She is somewhat confused.  She thinks that she is at home.  She definitely needs some Ativan .  She really had a hard time with physical therapy yesterday.  She is having quite a bit of pain.  Labs came back yesterday showed a white cell count 7.2.  Hemoglobin 10.5.  Platelet count 284,000.  The BUN is 9 creatinine 0.33.  Calcium  9.1 with an albumin  of 3.4..  She is getting her feeds.  This seems to be going pretty well.  She has had no fever.  There is no obvious shortness of breath.  There is no obvious cough.  She has had no nausea or vomiting.  I had that she has his agitation.  She actually talked  to me quite a bit this morning.  Unfortunately, it was a little bit erratic as to what she was saying.  No one was with her this morning.  I did call her down a little bit.  Again, we had a good prayer.  Her faith is strong.  She does not Klebsiella urinary tract infection.  She is on Rocephin .  Her vital signs show temperature 99.7.  Pulse 67.  Blood pressure 105/49.  Her head and neck exam shows no ocular or oral lesions.  There is no adenopathy in the neck.  She has good breath sounds bilaterally.  There is no wheezes.  Cardiac exam regular rate and rhythm.  Abdomen is soft.  Bowel sounds are somewhat decreased.  There is no guarding or rebound tenderness.  Extremities shows no clubbing, cyanosis or edema.  This clearly is a very lengthy recovery from this neurological event.  I just do not think that she is a candidate for systemic chemotherapy right now.  Of note, her free albumin  is only 9.  This is quite low.  Hopefully, she will will have little bit more orientation as the day progresses.  I know the staff up on [address] are really doing a great job with her.   Jeralyn Donath, MD  Inge 29:11

## 2024-09-10 NOTE — Plan of Care (Signed)
" °  Problem: Health Behavior/Discharge Planning: Goal: Ability to manage health-related needs will improve Outcome: Progressing   Problem: Nutritional: Goal: Maintenance of adequate nutrition will improve Outcome: Progressing   Problem: Skin Integrity: Goal: Risk for impaired skin integrity will decrease Outcome: Progressing   Problem: Clinical Measurements: Goal: Will remain free from infection Outcome: Progressing   Problem: Clinical Measurements: Goal: Respiratory complications will improve Outcome: Progressing   Problem: Activity: Goal: Risk for activity intolerance will decrease Outcome: Progressing   Problem: Nutrition: Goal: Adequate nutrition will be maintained Outcome: Progressing   Problem: Coping: Goal: Level of anxiety will decrease Outcome: Progressing   Problem: Pain Managment: Goal: General experience of comfort will improve and/or be controlled Outcome: Progressing   Problem: Safety: Goal: Ability to remain free from injury will improve Outcome: Progressing   Problem: Skin Integrity: Goal: Risk for impaired skin integrity will decrease Outcome: Progressing   "

## 2024-09-10 NOTE — Progress Notes (Signed)
 " Daily Progress Note   Date: 09/10/2024   Patient Name: Brenda [name]  DOB: 07-21-1961  MRN: [id number]  Age / Sex: 64 y.o., female  Attending Physician: Drusilla Sabas RAMAN, MD Primary Care Physician: [facility] Admit Date: 07/15/2024 Length of Stay: 55 days  Reason for Follow-up: Establishing goals of care  Past Medical History:  Diagnosis Date   Anemia    Arthritis    back, hands, hips (02/22/2015)   CAD (coronary artery disease)    a. complex LAD/diagonal bifurcation PCI in 2010. b. STEMI 10/2019 s/p PTCA/DES x1 to mLAD overlapping the old stent, residual disease treated medicaly.   [facility])    uterine   CHF (congestive heart failure) (HCC)    Depression    Diabetes mellitus (HCC)    started when I was pregnant; not sure if it was type 1 or type 2    History of radiation therapy    Endometrium- HDR 01/22/22-03/07/22- Dr. Lynwood Nasuti   Hypercholesterolemia    Hypertension    Morbid obesity (HCC)    Myocardial infarction (HCC)    mild x 3   Neuromuscular disorder (HCC)    neuropathy feet   Sleep apnea    cpap/    Assessment & Plan:   HPI/Patient Profile:   64 y.o. 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  Patient's mentation and rigidity has improved slightly s/p plasma exchange x5 that was completed 08/29/2024. Patient now s/p PEG tube on 09/08/2024. Patient remains a poor candidate for systemic chemotherapy at this time per oncology note. PT services signed off on 09/10/2024 again due to patient's inability to make progress towards goals. Overall progress towards continued aggressive cancer treatment and recovery in general towards rehab is stagnant.    Palliative medicine consulted for goals of care conversation.  SUMMARY OF RECOMMENDATIONS Continue current  interventions Goals of care remain clear with desire for life prolonging measures and plan for further treatment/rehab when able Palliative medicine will continue to follow Symptom management as below  Symptom Management:  Xanax  0.25 mg TID PRN for anxiety Buspar  5 mg BID for mood Fentanyl  patch 25 mcg/hr Q72 hrs Prozac  40 mg daily for mood Dilaudid  0.5 mg IV Q3 PRN Ativan  1 mg IV Q4 PRN for anxiety, sedation Oxycodone  2.5-5 mg Q4 PRN, Oxycodone  5 mg BID for moderate to severe pain Trazodone  25 mg bedtime PRN for sleep  Code Status: DNR - Limited (DNR/DNI)  Prognosis: Unable to determine  Discharge Planning: To Be Determined  Subjective:   Subjective: Chart Reviewed. Updates received. Patient Assessed. Created space and opportunity for patient  and family to explore thoughts and feelings regarding current medical situation.  Discussed with primary RN about the patient's request for pain medication for her groin area which may be from her foley catheter and frequent bowel movements resulting in skin breakdown.   Reviewed PT note on 09/10/2024 by Zina PT, PT services signed off for not making progress towards goals.  Reviewed oncology note by Timmy MD 09/10/2024 stating that patient is not a candidate for systemic chemotherapy at this time.   Today's Discussion:  Met with the patient at bedside today without any visitors present. Patient was able to tell me that she is in the hospital and that she is sick but unable to clarify why. Patient was anxious and repeated Don't  kill the baby and was not redirectable. Inquired about what she was talking about but unclear what she meant in regards to the baby. Reassured her there is no baby here, patient may be hallucinating about a past traumatic event. Patient endorsed being in pain in her groin area and was agreeable to receive some PRN medication to help with the pain.   Review of Systems  Genitourinary:  Positive for pelvic pain.     Objective:   Primary Diagnoses: Present on Admission:  UTI (urinary tract infection)  Chronic hypoxic respiratory failure (HCC)  Anxiety with depression  CAD (coronary artery disease)  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  DNR (do not resuscitate)  Endometrial carcinoma (HCC)  Obstructive sleep apnea  Acute metabolic encephalopathy   Vital Signs:  BP (!) 141/60 (BP Location: Left Arm)   Pulse 90   Temp 98.9 F (37.2 C)   Resp 19   Ht 5' 2 (1.575 m)   Wt 84.5 kg   LMP 10/30/2014   SpO2 100%   BMI 34.07 kg/m   Physical Exam Constitutional:      Appearance: She is obese. She is ill-appearing.  HENT:     Head: Normocephalic.     Nose: Nose normal.     Mouth/Throat:     Mouth: Mucous membranes are dry.  Cardiovascular:     Rate and Rhythm: Normal rate.     Pulses: Normal pulses.  Pulmonary:     Effort: Pulmonary effort is normal.     Comments: On nasal cannula Abdominal:     Comments: PEG tube in place and secure  Neurological:     Mental Status: She is alert. She is disoriented.     Comments: Oriented to self and location  Psychiatric:     Comments: Anxious     Palliative Assessment/Data: 30%   Existing Vynca/ACP Documentation: None  Thank you for allowing us  to participate in the care of Ryka Beighley PMT will continue to support holistically.  I personally spent a total of 35 minutes in the care of the patient today including preparing to see the patient, getting/reviewing separately obtained history, performing a medically appropriate exam/evaluation, and documenting clinical information in the EHR.  Kynnedy Carreno E Meghna Hagmann, PA-C  Palliative Medicine Team  Team Phone # [phone] (Nights/Weekends) 09/10/2024 6:00 PM  "

## 2024-09-10 NOTE — Plan of Care (Signed)
   Problem: Education: Goal: Ability to describe self-care measures that may prevent or decrease complications (Diabetes Survival Skills Education) will improve Outcome: Progressing Goal: Individualized Educational Video(s) Outcome: Progressing   Problem: Coping: Goal: Ability to adjust to condition or change in health will improve Outcome: Progressing

## 2024-09-10 NOTE — Progress Notes (Signed)
 Port dressing and needle changed yesterday by IV team, this evening noticed some pink areas of skin under and around dressing and a serous blister half under dressing, appears to be related to adhesive. MD notified.

## 2024-09-11 DIAGNOSIS — J9611 Chronic respiratory failure with hypoxia: Secondary | ICD-10-CM | POA: Diagnosis not present

## 2024-09-11 DIAGNOSIS — Z515 Encounter for palliative care: Secondary | ICD-10-CM | POA: Diagnosis not present

## 2024-09-11 DIAGNOSIS — G9341 Metabolic encephalopathy: Secondary | ICD-10-CM | POA: Diagnosis not present

## 2024-09-11 DIAGNOSIS — Z66 Do not resuscitate: Secondary | ICD-10-CM | POA: Diagnosis not present

## 2024-09-11 DIAGNOSIS — D649 Anemia, unspecified: Secondary | ICD-10-CM

## 2024-09-11 LAB — GLUCOSE, CAPILLARY
Glucose-Capillary: 141 mg/dL — ABNORMAL HIGH (ref 70–99)
Glucose-Capillary: 135 mg/dL — ABNORMAL HIGH (ref 70–99)
Glucose-Capillary: 179 mg/dL — ABNORMAL HIGH (ref 70–99)
Glucose-Capillary: 206 mg/dL — ABNORMAL HIGH (ref 70–99)
Glucose-Capillary: 185 mg/dL — ABNORMAL HIGH (ref 70–99)
Glucose-Capillary: 167 mg/dL — ABNORMAL HIGH (ref 70–99)

## 2024-09-11 NOTE — Progress Notes (Signed)
 IP PROGRESS NOTE  Subjective:   Brenda [name] was alert when I saw her at approximately 6:30 AM.  Her family was not present.  She denied pain.  She said I am doing well .  Objective: Vital signs in last 24 hours: Blood pressure 118/82, pulse 80, temperature 97.8 F (36.6 C), temperature source Oral, resp. rate 18, height 5' 2 (1.575 m), weight 186 lb 4.6 oz (84.5 kg), last menstrual period 10/30/2014, SpO2 100%.  Intake/Output from previous day: 01/08 0701 - 01/09 0700 In: 700 [NG/GT:600; IV Piggyback:100] Out: 1750 [Urine:1750]  Physical Exam: Appears comfortable  Neurologic: Speaking, follows commands, opens mouth, weak bilateral grip strength, moves toes and foot on the right greater than left, oriented to the hospital, not oriented to diagnosis, said I was her psychiatrist  abdomen: Soft, nontender, abdominal binder in place Musculoskeletal: No pain with movement of either arm or palpation of feet  Portacath/PICC-without erythema  Lab Results: Recent Labs    09/09/24 0600  WBC 7.2  HGB 10.5*  HCT 32.9*  PLT 284    BMET Recent Labs    09/09/24 0600  NA 140  K 4.0  CL 103  CO2 30  GLUCOSE 148*  BUN 9  CREATININE 0.33*  CALCIUM  9.1      Medications: I have reviewed the patient's current medications.  Assessment/Plan:  Endometrial cancer Diagnosed in 2023, status post surgery and radiation Recurrent disease July 2025 with a left retroperitoneal mass, biopsy confirmed adenocarcinoma, ER positive, MSI-high, tumor mutation burden 24, PD-L1 75%, PIK 3CA mutated, PTEN mutated, HER2 1+, loss of MLH1 expression 04/03/2024 CT chest: Spiculated 9 x 12 mm right upper lobe and part solid 4 x 6 mm right upper lobe nodules Cycle 1 paclitaxel /carboplatin /pembrolizumab  05/01/2024 Cycle 2 paclitaxel /carboplatin /pembrolizumab  06/30/2024 06/19/2024 CT abdomen/pelvis: Right lower lobe pulmonary embolism, splenic infarct, stable size of the left psoas mass with evidence of  erosive change at the anterior aspect of L2, decreased size of left external iliac node 08/09/2024 CTs: New psoas muscle mass (tumor versus hematoma), left psoas/paraspinal mass has decreased in size, by my review the previously noted lung nodules have resolved 08/18/2024 CT angiogram abdomen/pelvis-no evidence of active GI bleed, right psoas hematoma, L2 sclerotic change with left paraspinal mass  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  DVT 06/02/2024 4.  Pulmonary embolism noted on CT chest 06/19/2024 5.  Diabetes 6.  CHF/CAD 7.  Seizures, cefepime  toxicity?  November 2025 8.  Progressive generalized loss of motor function, altered mental status-related to CVAs?,  Paraneoplastic?  Toxicity from immunotherapy? 08/11/2024-Solu-Medrol  daily x 3 Therapy plasma exchange 08/21/2024, 08/23/2024, 08/25/2024, 08/27/2024, 08/29/2024 9.  History of respiratory failure 10.  Pain secondary to #1 11.  Anemia-multifactorial, 1 unit RBCs 09/07/2024 12.  History of elevated liver enzymes, now normal   Brenda [name] has a history of endometrial cancer initially diagnosed in 2020.  She was diagnosed with recurrent disease involving a left retroperitoneal mass in July 2025.  She completed 2 treatments with paclitaxel /carboplatin /pembrolizumab .  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  She completed 2 cycles of systemic therapy for treatment of the uterine cancer.  I reviewed the 08/09/2024 restaging CT findings with Brenda [name] and her daughter.  I reviewed the  08/18/2024 CT angiogram abdomen/pelvis.  The known malignancy at the left retroperitoneum has improved significantly.  The right psoas  mass appears to represent a hematoma.      The etiology of the diffuse generalized weakness remains unclear.  There is likely a component related to the embolic CVAs and deconditioning, but the evolution of the loss of motor function  is more severe than expected.  She has undergone an extensive neurologic evaluation over the past month.  Neurology is following her in the hospital..  Neurology feels her symptoms may be related to PD-1 inhibitor neurotoxicity, a paraneoplastic syndrome, or another inflammatory process.  She started high-dose Solu-Medrol  on 08/11/2024, given daily for 3 days.  Her neurologic status did not improve.  She began plasma exchange on 08/21/2024.  She completed treatment #5 on 08/29/2024.  Her neurologic status has partially improved.  She is more alert and conversant compared to when I saw her 09/04/2024.   The etiology of her pain is unclear based on the restaging CT.  The retroperitoneal mass is much smaller.  She could be having pain from the right psoas lesion or another etiology.  There is no clear evidence for progression of the metastatic carcinoma.  I recommend attempting to wean the CNS acting agents including narcotics .She appeared comfortable and denied pain when I saw her this morning.  She has persistent anemia.  The anemia is multifactorial including components related to bleeding, phlebotomy, and chronic disease.  She will not be a candidate for further treatment of the metastatic endometrial cancer unless her neurologic status and overall performance status continue to improve.  We are following up on germline testing based on the MLH1 loss and the tumor Recommendations: Wean narcotics and other CNS acting agents as tolerated Evaluation/management of neurologic status per neurology Anticoagulation -resumed with prophylactic dose Lovenox  08/31/2024 tube feedings, repeat speech pathology evaluation- advance diet as tolerated Systemic treatment for the uterine  cancer will remain on hold Continue goals of care discussions by palliative care and the medical service Please call oncology as needed, I will check on her 09/14/2024.           LOS: 56 days   Arley Hof, MD   09/11/2024, 1:14 PM

## 2024-09-11 NOTE — Progress Notes (Signed)
 [facility]  PROGRESS NOTE  Brenda [name] FMW:[id number] DOB: 12/09/1960 DOA: 07/15/2024 PCP: [facility]   Brief HPI:    64 y.o. female with PMH significant for metastatic endometrial cancer, chronic cancer-related pain, DM2, HTN, HLD, obesity, OSA, CAD/stent, CHF, CVA, h/o DVT and PE anticoagulated on Lovenox , chronic hypoxic respiratory failure on 2L via nasal cannula. Recently hospitalized 11/9 -11/11 for UTI, treated with IV antibiotics and discharged on oral antibiotics. Next day 11/12, patient presented to [facility] ED with chills and weakness.   She was found to have UTI and was started on IV cefepime .   Unfortunately mental status worsened  11/15, patient was noted to have a stiffening of upper extremities, she was transferred to [facility] for continuous EEG monitoring. 11/16, she was noted to have seizures and video EEG. She was started on Keppra . Meningitis/encephalitis was suspected and patient was started on empiric antibiotics.  CSF panel unremarkable and hence antibiotics were stopped  12/7, because of development of rigidity, neurology and oncology were reconsulted.  Palliative care consulted as well for cancer-related pain      Assessment/Plan:    Seizures Workup as above.  Currently on Keppra . Seizure precautions   Acute metabolic encephalopathy  Generalized rigidity Patient had significant altered mental status and generalized rigidity. Multiple possible etiologies were considered.   Neurology and oncology consult appreciated.  Cefepime  was discontinued.  Prozac  was discontinued.  EEG did not show epileptiform abnormalities.  CT head without acute intracranial abnormality.  MRI brain showed evolving infarcts. MRI C spine did not show any evidence of metastatic disease.  Other possible etiologies considered were - neurotoxicity from Keytruda  which was being used to treat her cancer.  Keytruda  was stopped.  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 S/p high dose steroids 12/9-12/11.  S/p trial sinimet.  S/p baclofen .   S/p 5 days of PLEX, completed on 12/28  Per neurology note from 12/29, likely favor an autoimmune process.  Mental status also influenced probably by pain regimen as well as hospital induced delirium.   Overall, patient's mental status seems to be gradually improving.  Neurology has signed off. Oncology and palliative care following. Family wants to continue aggressive care. Status post PEG tube placement on 09/08/2024  Anxiety -Improved after starting BuSpar  5 mg p.o. twice daily - Continue Ativan  1 mg IV every 4 hours as needed    Metastatic Endometrial Cancer Cancer Related Pain  Seen by oncology and palliative care. She had had multiple imagings done during this hospitalization.  Per oncology note, there is no clear evidence of progression of the metastatic carcinoma. Keytruda  on hold as explained above. Pain regimen --- Scheduled: Oxycodone  5 mg every 8 hours. Reduced to BID today. --- PRN: IV Dilaudid , oxycodone , Tylenol     Recent DVT/PE,Splenic Infarct In October 2025, patient was diagnosed to have right leg DVT, PE as well as a splenic infarct and she was started on anticoagulation.   This hospitalization, anticoagulation was held due to psoas hematoma and GI bleed.   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   Patient had BRBPR during this hospitalization BRBPR resolved at this time CT GI bleed study did not  show any evidence GI bleed. Patient was given 1 unit of PRBC transfusion on 12/1.  Also given IV Venofer  -Hemoglobin was 8.1 yesterday, status post 1 unit PRBC per  hematology yesterday, today hemoglobin is 10.8 Anticoagulation on hold. On DVT prophylaxis with Lovenox    Recurrent UTI  - Patient had Klebsiella pneumonia UTI in December which was treated with antibiotics for 5 days  - Had low-grade fever, UA was abnormal, urine culture again obtained and has grown Klebsiella pneumoniae -Started on ceftriaxone  2 g IV daily -Will treat with antibiotics for 5 days   Dysphagia Moderate malnutrition With altered mental status, patient was not able to to take oral intake for several days.  She is now eating some but very minimal and not enough to sustain her nutrition.  She has been receiving tube feeds through [employer] therapist and dietitian following.  IR consulted for PEG tube placement PEG tube placed on 09/08/2024   Type 2 diabetes mellitus A1c 7.2 on 06/01/2024 Currently on Lantus  15 units twice daily, insulin  aspart SSI every 4 hours  Continue to monitor  Hypertension 1/1, held previously ordered midodrine  5 mg twice daily and propranolol  10 mg 3 times daily  Blood pressure target less than 150 systolic.  Continue to monitor   Depression/anxiety Prozac  discontinued due to rigidity -Continue Ativan  as needed   H/o CAD, Stroke HLD Currently on Lipitor  80 mg daily.  Anticoagulation on hold as discussed above   Abnormal Thyroid  Function Tests It seems early in the course of hospitalization, TSH level was low and free T4 level was elevated at 1.41 on 11/16 Patient was started on propranolol  20 mg 3 times daily.  Subsequent repeat TSH level normal at 0.9 on 12/7 Propranolol  has been intermittently held because of low blood pressure.  1/1,  stopped propranolol .      DVT prophylaxis Lovenox   Medications     atorvastatin   80 mg Per Tube QHS   busPIRone   5 mg Oral BID   Chlorhexidine  Gluconate Cloth  6 each Topical Daily   diclofenac  Sodium  2 g Topical QID   enoxaparin  (LOVENOX ) injection  40 mg Subcutaneous Q24H   fentaNYL   1  patch Transdermal Q72H   FLUoxetine   40 mg Per Tube Daily   free water   100 mL Per Tube Q4H   gabapentin   100 mg Per Tube BID   insulin  aspart  0-9 Units Subcutaneous Q4H   insulin  glargine-yfgn  15 Units Subcutaneous BID   levETIRAcetam   500 mg Intravenous Q12H   lidocaine   1 patch Transdermal Daily   methylphenidate   5 mg Per Tube BID WC   nystatin   100,000 Units Topical TID   mouth rinse  15 mL Mouth Rinse 4 times per day   oxyCODONE   5 mg Per Tube BID   pantoprazole  (PROTONIX ) IV  40 mg Intravenous Q12H   polyethylene glycol  17 g Per Tube Daily   sodium chloride  flush  10-40 mL Intracatheter Q12H   thrombin  5,000 units for percutaneous treatment of pseudoaneurysm   Percutaneous Once     Data Reviewed:   CBG:  Recent Labs  Lab 09/10/24 1547 09/10/24 2003 09/10/24 2346 09/11/24 0400 09/11/24 0803  GLUCAP 226* 180* 142* 141* 135*    SpO2: 98 % O2 Flow Rate (L/min): 2 L/min FiO2 (%): 21 %    Vitals:   09/10/24 0802 09/10/24 1550 09/10/24 2346 09/11/24 0807  BP: (!) 142/53 (!) 141/60 (!) 108/56 (!) 120/59  Pulse: 92 90 66  85  Resp: 19 19 18 16   Temp: 98.6 F (37 C) 98.9 F (37.2 C) 98.1 F (36.7 C) 98 F (36.7 C)  TempSrc:   Oral Oral  SpO2: 100% 100% 94% 98%  Weight:      Height:          Data Reviewed:  Basic Metabolic Panel: Recent Labs  Lab 09/07/24 0420 09/09/24 0600  NA 140 140  K 4.0 4.0  CL 104 103  CO2 32 30  GLUCOSE 93 148*  BUN 12 9  CREATININE 0.30* 0.33*  CALCIUM  8.8* 9.1    CBC: Recent Labs  Lab 09/07/24 0420 09/08/24 1056 09/09/24 0600  WBC 4.1 7.6 7.2  NEUTROABS  --  6.1 5.2  HGB 8.1* 10.8* 10.5*  HCT 26.0* 33.3* 32.9*  MCV 97.7 95.4 95.6  PLT 281 293 284    LFT Recent Labs  Lab 09/07/24 0420 09/09/24 0600  AST 13* 15  ALT 16 14  ALKPHOS 77 91  BILITOT 0.2 0.4  PROT 5.0* 5.6*  ALBUMIN  3.2* 3.4*     Antibiotics: Anti-infectives (From admission, onward)    Start     Dose/Rate Route Frequency Ordered  Stop   09/09/24 0800  cefTRIAXone  (ROCEPHIN ) 2 g in sodium chloride  0.9 % 100 mL IVPB        2 g 200 mL/hr over 30 Minutes Intravenous Every 24 hours 09/09/24 0624 09/13/24 0759   09/08/24 0831  ceFAZolin  (ANCEF ) IVPB 2g/100 mL premix        over 30 Minutes Intravenous Continuous PRN 09/08/24 0831 09/08/24 0856   08/16/24 1100  cefTRIAXone  (ROCEPHIN ) 2 g in sodium chloride  0.9 % 100 mL IVPB        2 g 200 mL/hr over 30 Minutes Intravenous Every 24 hours 08/16/24 1001 08/20/24 1214   07/23/24 0600  vancomycin  (VANCOCIN ) IVPB 1000 mg/200 mL premix  Status:  Discontinued        1,000 mg 200 mL/hr over 60 Minutes Intravenous Every 12 hours 07/22/24 1834 07/24/24 1808   07/22/24 2200  meropenem  (MERREM ) 2 g in sodium chloride  0.9 % 100 mL IVPB  Status:  Discontinued        2 g 280 mL/hr over 30 Minutes Intravenous Every 8 hours 07/22/24 1601 07/24/24 1808   07/22/24 1700  vancomycin  (VANCOREADY) IVPB 1500 mg/300 mL        1,500 mg 150 mL/hr over 120 Minutes Intravenous  Once 07/22/24 1601 07/22/24 2031   07/22/24 1645  ampicillin  (OMNIPEN) 2 g in sodium chloride  0.9 % 100 mL IVPB  Status:  Discontinued        2 g 300 mL/hr over 20 Minutes Intravenous Every 6 hours 07/22/24 1557 07/24/24 1800   07/19/24 0530  meropenem  (MERREM ) 1 g in sodium chloride  0.9 % 100 mL IVPB  Status:  Discontinued        1 g 200 mL/hr over 30 Minutes Intravenous Every 8 hours 07/19/24 0438 07/22/24 1601   07/16/24 0900  ceFEPIme  (MAXIPIME ) 2 g in sodium chloride  0.9 % 100 mL IVPB  Status:  Discontinued        2 g 200 mL/hr over 30 Minutes Intravenous Every 8 hours 07/16/24 0811 07/19/24 0409   07/16/24 0815  ceFEPIme  (MAXIPIME ) 2 g in sodium chloride  0.9 % 100 mL IVPB  Status:  Discontinued        2 g 200 mL/hr over 30 Minutes Intravenous  Once 07/16/24 0803 07/16/24 0811   07/16/24 0330  cefTRIAXone  (ROCEPHIN ) 2 g in sodium chloride  0.9 % 100 mL IVPB        2 g 200 mL/hr over 30 Minutes Intravenous  Once 07/16/24  0315 07/16/24 0412        CONSULTS neurology, oncology  Code Status: DNR  Family Communication: Discussed with patient's daughter at bedside     Subjective   Patient seen and examined, right hand pain has improved.  Feels better today.  Denies pain and also says that anxiety has improved.  She was started on BuSpar  and Duragesic  patch yesterday   Objective    Physical Examination:  Appears in no acute distress S1-S2, regular Lungs clear to auscultation bilaterally Extremities-right hand mild edema, mild tenderness to palpation    Wound 07/25/24 1100 Pressure Injury Sacrum Medial Deep Tissue Pressure Injury - Purple or maroon localized area of discolored intact skin or blood-filled blister due to damage of underlying soft tissue from pressure and/or shear. (Active)     Wound 08/11/24 0415 Pressure Injury Vertebral column Medial Stage 2 -  Partial thickness loss of dermis presenting as a shallow open injury with a red, pink wound bed without slough. (Active)     Wound 08/31/24 2100 Pressure Injury Thigh Left;Posterior Stage 1 -  Intact skin with non-blanchable redness of a localized area usually over a bony prominence. (Active)        Sabas GORMAN Brod   [facility] If 7PM-7AM, please contact night-coverage at [email or web address], Office  [phone]   09/11/2024, 10:13 AM  LOS: 56 days

## 2024-09-11 NOTE — Plan of Care (Signed)
" °  Problem: Metabolic: Goal: Ability to maintain appropriate glucose levels will improve Outcome: Progressing   Problem: Skin Integrity: Goal: Risk for impaired skin integrity will decrease Outcome: Progressing   Problem: Tissue Perfusion: Goal: Adequacy of tissue perfusion will improve Outcome: Progressing   Problem: Clinical Measurements: Goal: Respiratory complications will improve Outcome: Progressing Goal: Cardiovascular complication will be avoided Outcome: Progressing   Problem: Coping: Goal: Level of anxiety will decrease Outcome: Progressing   Problem: Elimination: Goal: Will not experience complications related to bowel motility Outcome: Progressing Goal: Will not experience complications related to urinary retention Outcome: Progressing   Problem: Skin Integrity: Goal: Risk for impaired skin integrity will decrease Outcome: Progressing   Problem: Clinical Measurements: Goal: Respiratory complications will improve Outcome: Progressing   "

## 2024-09-12 DIAGNOSIS — N390 Urinary tract infection, site not specified: Secondary | ICD-10-CM | POA: Diagnosis not present

## 2024-09-12 DIAGNOSIS — J9611 Chronic respiratory failure with hypoxia: Secondary | ICD-10-CM | POA: Diagnosis not present

## 2024-09-12 DIAGNOSIS — G9341 Metabolic encephalopathy: Secondary | ICD-10-CM | POA: Diagnosis not present

## 2024-09-12 DIAGNOSIS — G893 Neoplasm related pain (acute) (chronic): Secondary | ICD-10-CM | POA: Diagnosis not present

## 2024-09-12 LAB — GLUCOSE, CAPILLARY
Glucose-Capillary: 122 mg/dL — ABNORMAL HIGH (ref 70–99)
Glucose-Capillary: 172 mg/dL — ABNORMAL HIGH (ref 70–99)
Glucose-Capillary: 179 mg/dL — ABNORMAL HIGH (ref 70–99)
Glucose-Capillary: 194 mg/dL — ABNORMAL HIGH (ref 70–99)
Glucose-Capillary: 236 mg/dL — ABNORMAL HIGH (ref 70–99)
Glucose-Capillary: 256 mg/dL — ABNORMAL HIGH (ref 70–99)

## 2024-09-12 MED ORDER — HALOPERIDOL LACTATE 5 MG/ML IJ SOLN
5.0000 mg | Freq: Once | INTRAMUSCULAR | Status: AC | PRN
  Administered 2024-09-13: 5 mg via INTRAVENOUS
  Filled 2024-09-12: qty 1

## 2024-09-12 MED ORDER — OXYCODONE HCL 5 MG PO TABS
5.0000 mg | ORAL_TABLET | Freq: Once | ORAL | Status: AC
  Administered 2024-09-12: 5 mg
  Filled 2024-09-12: qty 1

## 2024-09-12 MED ORDER — HALOPERIDOL LACTATE 5 MG/ML IJ SOLN
5.0000 mg | Freq: Once | INTRAMUSCULAR | Status: AC | PRN
  Administered 2024-09-12: 5 mg via INTRAVENOUS
  Filled 2024-09-12: qty 1

## 2024-09-12 MED ORDER — OXYCODONE HCL 5 MG PO TABS
5.0000 mg | ORAL_TABLET | Freq: Once | ORAL | Status: AC
  Administered 2024-09-12: 5 mg via ORAL
  Filled 2024-09-12: qty 1

## 2024-09-12 NOTE — Progress Notes (Signed)
 [facility]  PROGRESS NOTE  Brenda [name] FMW:[id number] DOB: May 19, 1961 DOA: 07/15/2024 PCP: [facility]   Brief HPI:    64 y.o. female with PMH significant for metastatic endometrial cancer, chronic cancer-related pain, DM2, HTN, HLD, obesity, OSA, CAD/stent, CHF, CVA, h/o DVT and PE anticoagulated on Lovenox , chronic hypoxic respiratory failure on 2L via nasal cannula. Recently hospitalized 11/9 -11/11 for UTI, treated with IV antibiotics and discharged on oral antibiotics. Next day 11/12, patient presented to [facility] ED with chills and weakness.   She was found to have UTI and was started on IV cefepime .   Unfortunately mental status worsened  11/15, patient was noted to have a stiffening of upper extremities, she was transferred to [facility] for continuous EEG monitoring. 11/16, she was noted to have seizures and video EEG. She was started on Keppra . Meningitis/encephalitis was suspected and patient was started on empiric antibiotics.  CSF panel unremarkable and hence antibiotics were stopped  12/7, because of development of rigidity, neurology and oncology were reconsulted.  Palliative care consulted as well for cancer-related pain      Assessment/Plan:    Seizures Workup as above.  Currently on Keppra . Seizure precautions   Acute metabolic encephalopathy  Generalized rigidity Patient had significant altered mental status and generalized rigidity. Multiple possible etiologies were considered.   Neurology and oncology consult appreciated.  Cefepime  was discontinued.  Prozac  was discontinued.  EEG did not show epileptiform abnormalities.  CT head without acute intracranial abnormality.  MRI brain showed evolving infarcts. MRI C spine did not show any evidence of metastatic disease.  Other possible etiologies considered were - neurotoxicity from Keytruda  which was being used to treat her cancer.  Keytruda  was stopped.  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 S/p high dose steroids 12/9-12/11.  S/p trial sinimet.  S/p baclofen .   S/p 5 days of PLEX, completed on 12/28  Per neurology note from 12/29, likely favor an autoimmune process.  Mental status also influenced probably by pain regimen as well as hospital induced delirium.   Overall, patient's mental status seems to be gradually improving.  Neurology has signed off. Oncology and palliative care following. Family wants to continue aggressive care. Status post PEG tube placement on 09/08/2024  Anxiety -Improved after starting BuSpar  5 mg p.o. twice daily - Continue Ativan  1 mg IV every 4 hours as needed    Metastatic Endometrial Cancer Cancer Related Pain  Seen by oncology and palliative care. She had had multiple imagings done during this hospitalization.  Per oncology note, there is no clear evidence of progression of the metastatic carcinoma. Keytruda  on hold as explained above. Pain regimen --- Scheduled: Oxycodone  5 mg every 8 hours. Reduced to BID today. --- PRN: IV Dilaudid , oxycodone , Tylenol     Recent DVT/PE,Splenic Infarct In October 2025, patient was diagnosed to have right leg DVT, PE as well as a splenic infarct and she was started on anticoagulation.   This hospitalization, anticoagulation was held due to psoas hematoma and GI bleed.   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   Patient had BRBPR during this hospitalization BRBPR resolved at this time CT GI bleed study did  not show any evidence GI bleed. Patient was given 1 unit of PRBC transfusion on 12/1.  Also given IV Venofer  -Hemoglobin was 8.1 yesterday, status post 1  unit PRBC per hematology yesterday, today hemoglobin is 10.8 Anticoagulation on hold. On DVT prophylaxis with Lovenox    Recurrent UTI  - Patient had Klebsiella pneumonia UTI in December which was treated with antibiotics for 5 days  - Had low-grade fever, UA was abnormal, urine culture again obtained and has grown Klebsiella pneumoniae -Started on ceftriaxone  2 g IV daily -Will treat with antibiotics for 5 days   Dysphagia Moderate malnutrition With altered mental status, patient was not able to to take oral intake for several days.  She is now eating some but very minimal and not enough to sustain her nutrition.  She has been receiving tube feeds through [employer] therapist and dietitian following.  IR consulted for PEG tube placement PEG tube placed on 09/08/2024   Type 2 diabetes mellitus A1c 7.2 on 06/01/2024 Currently on Lantus  15 units twice daily, insulin  aspart SSI every 4 hours  Continue to monitor  Hypertension 1/1, held previously ordered midodrine  5 mg twice daily and propranolol  10 mg 3 times daily  Blood pressure target less than 150 systolic.  Continue to monitor   Depression/anxiety Prozac  discontinued due to rigidity -Continue Ativan  as needed   H/o CAD, Stroke HLD Currently on Lipitor  80 mg daily.  Anticoagulation on hold as discussed above   Abnormal Thyroid  Function Tests It seems early in the course of hospitalization, TSH level was low and free T4 level was elevated at 1.41 on 11/16 Patient was started on propranolol  20 mg 3 times daily.  Subsequent repeat TSH level normal at 0.9 on 12/7 Propranolol  has been intermittently held because of low blood pressure.  1/1,  stopped propranolol .      DVT prophylaxis Lovenox   Medications     atorvastatin   80 mg Per Tube QHS   busPIRone   5 mg Oral BID   Chlorhexidine  Gluconate Cloth  6 each Topical Daily   enoxaparin  (LOVENOX ) injection  40 mg Subcutaneous Q24H   fentaNYL   1 patch Transdermal Q72H    FLUoxetine   40 mg Per Tube Daily   free water   100 mL Per Tube Q4H   gabapentin   100 mg Per Tube BID   insulin  aspart  0-9 Units Subcutaneous Q4H   insulin  glargine-yfgn  15 Units Subcutaneous BID   levETIRAcetam   500 mg Intravenous Q12H   lidocaine   1 patch Transdermal Daily   methylphenidate   5 mg Per Tube BID WC   nystatin   100,000 Units Topical TID   mouth rinse  15 mL Mouth Rinse 4 times per day   oxyCODONE   5 mg Per Tube BID   pantoprazole  (PROTONIX ) IV  40 mg Intravenous Q12H   polyethylene glycol  17 g Per Tube Daily   sodium chloride  flush  10-40 mL Intracatheter Q12H   thrombin  5,000 units for percutaneous treatment of pseudoaneurysm   Percutaneous Once     Data Reviewed:   CBG:  Recent Labs  Lab 09/11/24 1606 09/11/24 1931 09/11/24 2324 09/12/24 0351 09/12/24 0823  GLUCAP 206* 185* 167* 122* 172*    SpO2: 99 % O2 Flow Rate (L/min): 2 L/min FiO2 (%): 21 %    Vitals:   09/11/24 1931 09/11/24 2323 09/12/24 0352 09/12/24 0815  BP: (!) 132/55 (!) 121/55 (!) 118/58 (!) 143/60  Pulse: 73 72 (!) 59 84  Resp: 18 18 18  18  Temp: (!) 100.4 F (38 C) (!) 97.2 F (36.2 C) (!) 97.4 F (36.3 C) 99.1 F (37.3 C)  TempSrc: Oral Oral Oral   SpO2: 100% 100% 100% 99%  Weight:      Height:          Data Reviewed:  Basic Metabolic Panel: Recent Labs  Lab 09/07/24 0420 09/09/24 0600  NA 140 140  K 4.0 4.0  CL 104 103  CO2 32 30  GLUCOSE 93 148*  BUN 12 9  CREATININE 0.30* 0.33*  CALCIUM  8.8* 9.1    CBC: Recent Labs  Lab 09/07/24 0420 09/08/24 1056 09/09/24 0600  WBC 4.1 7.6 7.2  NEUTROABS  --  6.1 5.2  HGB 8.1* 10.8* 10.5*  HCT 26.0* 33.3* 32.9*  MCV 97.7 95.4 95.6  PLT 281 293 284    LFT Recent Labs  Lab 09/07/24 0420 09/09/24 0600  AST 13* 15  ALT 16 14  ALKPHOS 77 91  BILITOT 0.2 0.4  PROT 5.0* 5.6*  ALBUMIN  3.2* 3.4*     Antibiotics: Anti-infectives (From admission, onward)    Start     Dose/Rate Route Frequency Ordered Stop    09/09/24 0800  cefTRIAXone  (ROCEPHIN ) 2 g in sodium chloride  0.9 % 100 mL IVPB        2 g 200 mL/hr over 30 Minutes Intravenous Every 24 hours 09/09/24 0624 09/12/24 0818   09/08/24 0831  ceFAZolin  (ANCEF ) IVPB 2g/100 mL premix        over 30 Minutes Intravenous Continuous PRN 09/08/24 0831 09/08/24 0856   08/16/24 1100  cefTRIAXone  (ROCEPHIN ) 2 g in sodium chloride  0.9 % 100 mL IVPB        2 g 200 mL/hr over 30 Minutes Intravenous Every 24 hours 08/16/24 1001 08/20/24 1214   07/23/24 0600  vancomycin  (VANCOCIN ) IVPB 1000 mg/200 mL premix  Status:  Discontinued        1,000 mg 200 mL/hr over 60 Minutes Intravenous Every 12 hours 07/22/24 1834 07/24/24 1808   07/22/24 2200  meropenem  (MERREM ) 2 g in sodium chloride  0.9 % 100 mL IVPB  Status:  Discontinued        2 g 280 mL/hr over 30 Minutes Intravenous Every 8 hours 07/22/24 1601 07/24/24 1808   07/22/24 1700  vancomycin  (VANCOREADY) IVPB 1500 mg/300 mL        1,500 mg 150 mL/hr over 120 Minutes Intravenous  Once 07/22/24 1601 07/22/24 2031   07/22/24 1645  ampicillin  (OMNIPEN) 2 g in sodium chloride  0.9 % 100 mL IVPB  Status:  Discontinued        2 g 300 mL/hr over 20 Minutes Intravenous Every 6 hours 07/22/24 1557 07/24/24 1800   07/19/24 0530  meropenem  (MERREM ) 1 g in sodium chloride  0.9 % 100 mL IVPB  Status:  Discontinued        1 g 200 mL/hr over 30 Minutes Intravenous Every 8 hours 07/19/24 0438 07/22/24 1601   07/16/24 0900  ceFEPIme  (MAXIPIME ) 2 g in sodium chloride  0.9 % 100 mL IVPB  Status:  Discontinued        2 g 200 mL/hr over 30 Minutes Intravenous Every 8 hours 07/16/24 0811 07/19/24 0409   07/16/24 0815  ceFEPIme  (MAXIPIME ) 2 g in sodium chloride  0.9 % 100 mL IVPB  Status:  Discontinued        2 g 200 mL/hr over 30 Minutes Intravenous  Once 07/16/24 0803 07/16/24 0811   07/16/24 0330  cefTRIAXone  (ROCEPHIN ) 2 g in  sodium chloride  0.9 % 100 mL IVPB        2 g 200 mL/hr over 30 Minutes Intravenous  Once 07/16/24 0315  07/16/24 0412        CONSULTS neurology, oncology  Code Status: DNR  Family Communication: Discussed with patient's daughter at bedside     Subjective   Patient seen and examined.  Denies pain.  Right hand swelling and pain has improved with topical diclofenac  gel.  Objective    Physical Examination:  Appears in no acute distress S1-S2, regular, no murmur auscultated Lungs clear to auscultation bilaterally Extremities-trace edema in the right hand    Wound 07/25/24 1100 Pressure Injury Sacrum Medial Deep Tissue Pressure Injury - Purple or maroon localized area of discolored intact skin or blood-filled blister due to damage of underlying soft tissue from pressure and/or shear. (Active)     Wound 08/11/24 0415 Pressure Injury Vertebral column Medial Stage 2 -  Partial thickness loss of dermis presenting as a shallow open injury with a red, pink wound bed without slough. (Active)     Wound 08/31/24 2100 Pressure Injury Thigh Left;Posterior Stage 1 -  Intact skin with non-blanchable redness of a localized area usually over a bony prominence. (Active)        Sabas GORMAN Brod   [facility] If 7PM-7AM, please contact night-coverage at [email or web address], Office  [phone]   09/12/2024, 11:00 AM  LOS: 57 days

## 2024-09-12 NOTE — Plan of Care (Signed)
" °  Problem: Fluid Volume: Goal: Ability to maintain a balanced intake and output will improve Outcome: Progressing   Problem: Nutritional: Goal: Maintenance of adequate nutrition will improve Outcome: Progressing   Problem: Tissue Perfusion: Goal: Adequacy of tissue perfusion will improve Outcome: Progressing   Problem: Education: Goal: Knowledge of General Education information will improve Description: Including pain rating scale, medication(s)/side effects and non-pharmacologic comfort measures Outcome: Progressing   Problem: Clinical Measurements: Goal: Respiratory complications will improve Outcome: Progressing Goal: Cardiovascular complication will be avoided Outcome: Progressing   Problem: Elimination: Goal: Will not experience complications related to bowel motility Outcome: Progressing   Problem: Clinical Measurements: Goal: Respiratory complications will improve Outcome: Progressing Goal: Cardiovascular complication will be avoided Outcome: Progressing   Problem: Skin Integrity: Goal: Risk for impaired skin integrity will decrease Outcome: Progressing   "

## 2024-09-13 DIAGNOSIS — G9341 Metabolic encephalopathy: Secondary | ICD-10-CM | POA: Diagnosis not present

## 2024-09-13 LAB — GLUCOSE, CAPILLARY
Glucose-Capillary: 114 mg/dL — ABNORMAL HIGH (ref 70–99)
Glucose-Capillary: 124 mg/dL — ABNORMAL HIGH (ref 70–99)
Glucose-Capillary: 215 mg/dL — ABNORMAL HIGH (ref 70–99)
Glucose-Capillary: 259 mg/dL — ABNORMAL HIGH (ref 70–99)
Glucose-Capillary: 285 mg/dL — ABNORMAL HIGH (ref 70–99)
Glucose-Capillary: 381 mg/dL — ABNORMAL HIGH (ref 70–99)

## 2024-09-13 MED ORDER — METHYLPREDNISOLONE SODIUM SUCC 40 MG IJ SOLR
40.0000 mg | Freq: Once | INTRAMUSCULAR | Status: AC
  Administered 2024-09-13: 40 mg via INTRAVENOUS
  Filled 2024-09-13: qty 1

## 2024-09-13 MED ORDER — FUROSEMIDE 10 MG/ML IJ SOLN
40.0000 mg | Freq: Once | INTRAMUSCULAR | Status: AC
  Administered 2024-09-13: 40 mg via INTRAVENOUS
  Filled 2024-09-13: qty 4

## 2024-09-13 NOTE — Progress Notes (Signed)
 [facility]  PROGRESS NOTE  Brenda [name] FMW:[id number] DOB: 1961-06-25 DOA: 07/15/2024 PCP: [facility]   Brief HPI:    64 y.o. female with PMH significant for metastatic endometrial cancer, chronic cancer-related pain, DM2, HTN, HLD, obesity, OSA, CAD/stent, CHF, CVA, h/o DVT and PE anticoagulated on Lovenox , chronic hypoxic respiratory failure on 2L via nasal cannula. Recently hospitalized 11/9 -11/11 for UTI, treated with IV antibiotics and discharged on oral antibiotics. Next day 11/12, patient presented to [facility] ED with chills and weakness.   She was found to have UTI and was started on IV cefepime .   Unfortunately mental status worsened  11/15, patient was noted to have a stiffening of upper extremities, she was transferred to [facility] for continuous EEG monitoring. 11/16, she was noted to have seizures and video EEG. She was started on Keppra . Meningitis/encephalitis was suspected and patient was started on empiric antibiotics.  CSF panel unremarkable and hence antibiotics were stopped  12/7, because of development of rigidity, neurology and oncology were reconsulted.  Palliative care consulted as well for cancer-related pain      Assessment/Plan:    Seizures Workup as above.  Currently on Keppra . Seizure precautions   Acute metabolic encephalopathy  Generalized rigidity Patient had significant altered mental status and generalized rigidity. Multiple possible etiologies were considered.   Neurology and oncology consult appreciated.  Cefepime  was discontinued.  Prozac  was discontinued.  EEG did not show epileptiform abnormalities.  CT head without acute intracranial abnormality.  MRI brain showed evolving infarcts. MRI C spine did not show any evidence of metastatic disease.  Other possible etiologies considered were - neurotoxicity from Keytruda  which was being used to treat her cancer.  Keytruda  was stopped.  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 S/p high dose steroids 12/9-12/11.  S/p trial sinimet.  S/p baclofen .   S/p 5 days of PLEX, completed on 12/28  Per neurology note from 12/29, likely favor an autoimmune process.  Mental status also influenced probably by pain regimen as well as hospital induced delirium.   Overall, patient's mental status seems to be gradually improving.  Neurology has signed off. Oncology and palliative care following. Family wants to continue aggressive care. Status post PEG tube placement on 09/08/2024  Anxiety -Improved after starting BuSpar  5 mg p.o. twice daily - Continue Ativan  1 mg IV every 4 hours as needed    Metastatic Endometrial Cancer Cancer Related Pain  Seen by oncology and palliative care. She had had multiple imagings done during this hospitalization.  Per oncology note, there is no clear evidence of progression of the metastatic carcinoma. Keytruda  on hold as explained above. Pain regimen --- Scheduled: Oxycodone  5 mg every 8 hours. Reduced to BID today. --- PRN: IV Dilaudid , oxycodone , Tylenol     Recent DVT/PE,Splenic Infarct In October 2025, patient was diagnosed to have right leg DVT, PE as well as a splenic infarct and she was started on anticoagulation.   This hospitalization, anticoagulation was held due to psoas hematoma and GI bleed.   Currently only on DVT prophylaxis  Right hand/wrist swelling -Improved -X-ray of right hand could not be obtained as patient was not cooperative with technician -Pain and swelling improved after starting diclofenac  gel 4 times a day for 2 days 1/11 Lasix  40 mg x 1 dose IV given.  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  Patient had BRBPR during this hospitalization BRBPR resolved at this time CT GI bleed study did not show any evidence GI bleed. Patient was given 1 unit of PRBC transfusion on 12/1.  Also  given IV Venofer  -Hemoglobin was 8.1 yesterday, status post 1 unit PRBC per hematology yesterday, today hemoglobin is 10.8 Anticoagulation on hold. On DVT prophylaxis with Lovenox    Recurrent UTI  - Patient had Klebsiella pneumonia UTI in December which was treated with antibiotics for 5 days  - Had low-grade fever, UA was abnormal, urine culture again obtained and has grown Klebsiella pneumoniae -Started on ceftriaxone  2 g IV daily -Will treat with antibiotics for 5 days   Dysphagia Moderate malnutrition With altered mental status, patient was not able to to take oral intake for several days.  She is now eating some but very minimal and not enough to sustain her nutrition.  She has been receiving tube feeds through [employer] therapist and dietitian following.  IR consulted for PEG tube placement PEG tube placed on 09/08/2024   Type 2 diabetes mellitus A1c 7.2 on 06/01/2024 Currently on Lantus  15 units twice daily, insulin  aspart SSI every 4 hours  Continue to monitor  Hypertension 1/1, held previously ordered midodrine  5 mg twice daily and propranolol  10 mg 3 times daily  Blood pressure target less than 150 systolic.  Continue to monitor   Depression/anxiety Prozac  discontinued due to rigidity -Continue Ativan  as needed   H/o CAD, Stroke HLD Currently on Lipitor  80 mg daily.  Anticoagulation on hold as discussed above   Abnormal Thyroid  Function Tests It seems early in the course of hospitalization, TSH level was low and free T4 level was elevated at 1.41 on 11/16 Patient was started on propranolol  20 mg 3 times daily.  Subsequent repeat TSH level normal at 0.9 on 12/7 Propranolol  has been intermittently held because of low blood pressure.  1/1,  stopped propranolol .      DVT prophylaxis Lovenox   Medications     atorvastatin   80 mg Per Tube QHS   busPIRone   5 mg Oral BID   Chlorhexidine  Gluconate Cloth  6 each Topical Daily   enoxaparin  (LOVENOX ) injection  40  mg Subcutaneous Q24H   fentaNYL   1 patch Transdermal Q72H   FLUoxetine   40 mg Per Tube Daily   free water   100 mL Per Tube Q4H   gabapentin   100 mg Per Tube BID   insulin  aspart  0-9 Units Subcutaneous Q4H   insulin  glargine-yfgn  15 Units Subcutaneous BID   levETIRAcetam   500 mg Intravenous Q12H   lidocaine   1 patch Transdermal Daily   methylphenidate   5 mg Per Tube BID WC   nystatin   100,000 Units Topical TID   mouth rinse  15 mL Mouth Rinse 4 times per day   oxyCODONE   5 mg Per Tube BID   pantoprazole  (PROTONIX ) IV  40 mg Intravenous Q12H   polyethylene glycol  17 g Per Tube Daily   sodium chloride  flush  10-40 mL Intracatheter Q12H   thrombin  5,000 units for percutaneous treatment of pseudoaneurysm   Percutaneous Once     Data Reviewed:   CBG:  Recent Labs  Lab 09/12/24 2032 09/12/24 2340 09/13/24 0424 09/13/24 0813 09/13/24 1117  GLUCAP 179* 194* 124* 114* 215*    SpO2: 99 % O2 Flow Rate (L/min): 2 L/min FiO2 (%): 21 %    Vitals:   09/12/24 2336 09/13/24 0419 09/13/24 0741 09/13/24 1205  BP: (!) 116/57 128/63 119/65 ROLLEN)  146/69  Pulse: (!) 56 63 (!) 54 98  Resp: 17 17 18    Temp: 98.5 F (36.9 C) 97.6 F (36.4 C) 98.5 F (36.9 C) 98.6 F (37 C)  TempSrc: Axillary Axillary  Oral  SpO2: 100% 100% 100% 99%  Weight:      Height:          Data Reviewed:  Basic Metabolic Panel: Recent Labs  Lab 09/07/24 0420 09/09/24 0600  NA 140 140  K 4.0 4.0  CL 104 103  CO2 32 30  GLUCOSE 93 148*  BUN 12 9  CREATININE 0.30* 0.33*  CALCIUM  8.8* 9.1    CBC: Recent Labs  Lab 09/07/24 0420 09/08/24 1056 09/09/24 0600  WBC 4.1 7.6 7.2  NEUTROABS  --  6.1 5.2  HGB 8.1* 10.8* 10.5*  HCT 26.0* 33.3* 32.9*  MCV 97.7 95.4 95.6  PLT 281 293 284    LFT Recent Labs  Lab 09/07/24 0420 09/09/24 0600  AST 13* 15  ALT 16 14  ALKPHOS 77 91  BILITOT 0.2 0.4  PROT 5.0* 5.6*  ALBUMIN  3.2* 3.4*     Antibiotics: Anti-infectives (From admission, onward)     Start     Dose/Rate Route Frequency Ordered Stop   09/09/24 0800  cefTRIAXone  (ROCEPHIN ) 2 g in sodium chloride  0.9 % 100 mL IVPB        2 g 200 mL/hr over 30 Minutes Intravenous Every 24 hours 09/09/24 0624 09/12/24 0818   09/08/24 0831  ceFAZolin  (ANCEF ) IVPB 2g/100 mL premix        over 30 Minutes Intravenous Continuous PRN 09/08/24 0831 09/08/24 0856   08/16/24 1100  cefTRIAXone  (ROCEPHIN ) 2 g in sodium chloride  0.9 % 100 mL IVPB        2 g 200 mL/hr over 30 Minutes Intravenous Every 24 hours 08/16/24 1001 08/20/24 1214   07/23/24 0600  vancomycin  (VANCOCIN ) IVPB 1000 mg/200 mL premix  Status:  Discontinued        1,000 mg 200 mL/hr over 60 Minutes Intravenous Every 12 hours 07/22/24 1834 07/24/24 1808   07/22/24 2200  meropenem  (MERREM ) 2 g in sodium chloride  0.9 % 100 mL IVPB  Status:  Discontinued        2 g 280 mL/hr over 30 Minutes Intravenous Every 8 hours 07/22/24 1601 07/24/24 1808   07/22/24 1700  vancomycin  (VANCOREADY) IVPB 1500 mg/300 mL        1,500 mg 150 mL/hr over 120 Minutes Intravenous  Once 07/22/24 1601 07/22/24 2031   07/22/24 1645  ampicillin  (OMNIPEN) 2 g in sodium chloride  0.9 % 100 mL IVPB  Status:  Discontinued        2 g 300 mL/hr over 20 Minutes Intravenous Every 6 hours 07/22/24 1557 07/24/24 1800   07/19/24 0530  meropenem  (MERREM ) 1 g in sodium chloride  0.9 % 100 mL IVPB  Status:  Discontinued        1 g 200 mL/hr over 30 Minutes Intravenous Every 8 hours 07/19/24 0438 07/22/24 1601   07/16/24 0900  ceFEPIme  (MAXIPIME ) 2 g in sodium chloride  0.9 % 100 mL IVPB  Status:  Discontinued        2 g 200 mL/hr over 30 Minutes Intravenous Every 8 hours 07/16/24 0811 07/19/24 0409   07/16/24 0815  ceFEPIme  (MAXIPIME ) 2 g in sodium chloride  0.9 % 100 mL IVPB  Status:  Discontinued        2 g 200 mL/hr over 30 Minutes Intravenous  Once 07/16/24  9196 07/16/24 0811   07/16/24 0330  cefTRIAXone  (ROCEPHIN ) 2 g in sodium chloride  0.9 % 100 mL IVPB        2 g 200  mL/hr over 30 Minutes Intravenous  Once 07/16/24 0315 07/16/24 0412        CONSULTS neurology, oncology  Code Status: DNR  Family Communication: Discussed with patient's daughter at bedside     Subjective   Patient was seen and examined during morning rounds.   Patient was complaining of feeling pain in bilateral hands and mild swelling, no any other complaints.  Patient is AO x 1.   Objective    Physical Examination:  Appears in no acute distress S1-S2, regular, no murmur auscultated Lungs clear to auscultation bilaterally Extremities-trace edema in the right hand    Wound 07/25/24 1100 Pressure Injury Sacrum Medial Deep Tissue Pressure Injury - Purple or maroon localized area of discolored intact skin or blood-filled blister due to damage of underlying soft tissue from pressure and/or shear. (Active)     Wound 08/11/24 0415 Pressure Injury Vertebral column Medial Stage 2 -  Partial thickness loss of dermis presenting as a shallow open injury with a red, pink wound bed without slough. (Active)     Wound 08/31/24 2100 Pressure Injury Thigh Left;Posterior Stage 1 -  Intact skin with non-blanchable redness of a localized area usually over a bony prominence. (Active)   Nahzir Pohle Von   [facility] If 7PM-7AM, please contact night-coverage at [email or web address], Office  [phone]  09/13/2024, 3:07 PM  LOS: 58 days

## 2024-09-13 NOTE — Plan of Care (Signed)
  Problem: Metabolic: Goal: Ability to maintain appropriate glucose levels will improve Outcome: Progressing   Problem: Nutritional: Goal: Maintenance of adequate nutrition will improve Outcome: Progressing   Problem: Tissue Perfusion: Goal: Adequacy of tissue perfusion will improve Outcome: Progressing   Problem: Education: Goal: Ability to describe self-care measures that may prevent or decrease complications (Diabetes Survival Skills Education) will improve Outcome: Not Progressing   Problem: Health Behavior/Discharge Planning: Goal: Ability to identify and utilize available resources and services will improve Outcome: Not Progressing

## 2024-09-13 NOTE — Plan of Care (Signed)
" °  Problem: Skin Integrity: Goal: Risk for impaired skin integrity will decrease Outcome: Progressing   Problem: Clinical Measurements: Goal: Respiratory complications will improve Outcome: Progressing Goal: Cardiovascular complication will be avoided Outcome: Progressing   Problem: Elimination: Goal: Will not experience complications related to bowel motility Outcome: Progressing Goal: Will not experience complications related to urinary retention Outcome: Progressing   Problem: Clinical Measurements: Goal: Cardiovascular complication will be avoided Outcome: Progressing   Problem: Activity: Goal: Risk for activity intolerance will decrease Outcome: Progressing   Problem: Skin Integrity: Goal: Risk for impaired skin integrity will decrease Outcome: Progressing   "

## 2024-09-13 NOTE — Progress Notes (Signed)
 PT Cancellation Note  Patient Details Name: Brenda [name] MRN: [id number] DOB: Dec 27, 1960   Cancelled Treatment:    Reason Eval/Treat Not Completed: Other (comment) Pt with fluctuating pain levels with pt intermittently crying out. Will follow and see if pt is appropriate for acute Physical Therapy. Pt has been evaluated and discharged twice from Physical Therapy due to lack of progress towards goals and inability to tolerate movement.  Kate ORN, PT, DPT Secure Chat [facility] Office [phone]   Kate BRAVO [name] 09/13/2024, 12:00 PM

## 2024-09-14 DIAGNOSIS — G9341 Metabolic encephalopathy: Secondary | ICD-10-CM | POA: Diagnosis not present

## 2024-09-14 LAB — GLUCOSE, CAPILLARY
Glucose-Capillary: 155 mg/dL — ABNORMAL HIGH (ref 70–99)
Glucose-Capillary: 228 mg/dL — ABNORMAL HIGH (ref 70–99)
Glucose-Capillary: 295 mg/dL — ABNORMAL HIGH (ref 70–99)
Glucose-Capillary: 297 mg/dL — ABNORMAL HIGH (ref 70–99)
Glucose-Capillary: 345 mg/dL — ABNORMAL HIGH (ref 70–99)
Glucose-Capillary: 352 mg/dL — ABNORMAL HIGH (ref 70–99)

## 2024-09-14 MED ORDER — FLUCONAZOLE 150 MG PO TABS
150.0000 mg | ORAL_TABLET | Freq: Once | ORAL | Status: AC
  Administered 2024-09-14: 150 mg
  Filled 2024-09-14: qty 1

## 2024-09-14 MED ORDER — GLUCERNA 1.5 CAL PO LIQD
120.0000 mL | Freq: Three times a day (TID) | ORAL | Status: AC
  Administered 2024-09-15 (×2): 120 mL
  Filled 2024-09-14 (×2): qty 237

## 2024-09-14 MED ORDER — FREE WATER
150.0000 mL | Freq: Every day | Status: DC
  Administered 2024-09-14 – 2024-09-17 (×14): 150 mL

## 2024-09-14 MED ORDER — INSULIN GLARGINE-YFGN 100 UNIT/ML ~~LOC~~ SOLN: 20.0000 [IU] | Freq: Two times a day (BID) | SUBCUTANEOUS | Status: DC

## 2024-09-14 MED ORDER — GLUCERNA 1.5 CAL PO LIQD
237.0000 mL | Freq: Three times a day (TID) | ORAL | Status: DC
  Administered 2024-09-15 – 2024-09-21 (×24): 237 mL
  Filled 2024-09-14 (×31): qty 237

## 2024-09-14 MED ORDER — INSULIN GLARGINE 100 UNIT/ML ~~LOC~~ SOLN
20.0000 [IU] | Freq: Two times a day (BID) | SUBCUTANEOUS | Status: DC
  Administered 2024-09-14: 20 [IU] via SUBCUTANEOUS
  Filled 2024-09-14 (×3): qty 0.2

## 2024-09-14 MED ORDER — LEVETIRACETAM 100 MG/ML PO SOLN
500.0000 mg | Freq: Two times a day (BID) | ORAL | Status: AC
  Administered 2024-09-14 – 2024-10-09 (×51): 500 mg
  Filled 2024-09-14 (×53): qty 5

## 2024-09-14 MED ORDER — PROSOURCE TF20 ENFIT COMPATIBL EN LIQD
60.0000 mL | Freq: Every day | ENTERAL | Status: AC
  Administered 2024-09-15 – 2024-10-09 (×25): 60 mL
  Filled 2024-09-14 (×25): qty 60

## 2024-09-14 MED ORDER — METHYLPHENIDATE HCL 5 MG PO TABS: 5.0000 mg | ORAL_TABLET | Freq: Two times a day (BID) | ORAL | Status: AC | PRN

## 2024-09-14 NOTE — Progress Notes (Signed)
 PT Cancellation Note  Patient Details Name: Brenda [name] MRN: [id number] DOB: July 04, 1961   Cancelled Treatment:    Reason Eval/Treat Not Completed: PT screened, no needs identified, will sign off;Other (comment) (New PT order received. Noted no change in status since pt was DC from PT services on 1/7. Dr. Von in agreement that no new PT needed. Will sign off at this time.)   Kaylee Wombles 09/14/2024, 8:22 AM

## 2024-09-14 NOTE — Plan of Care (Signed)
" °  Problem: Nutritional: Goal: Maintenance of adequate nutrition will improve Outcome: Progressing   Problem: Skin Integrity: Goal: Risk for impaired skin integrity will decrease Outcome: Progressing   Problem: Tissue Perfusion: Goal: Adequacy of tissue perfusion will improve Outcome: Progressing   Problem: Education: Goal: Knowledge of General Education information will improve Description: Including pain rating scale, medication(s)/side effects and non-pharmacologic comfort measures Outcome: Progressing   Problem: Clinical Measurements: Goal: Will remain free from infection Outcome: Progressing Goal: Diagnostic test results will improve Outcome: Progressing Goal: Respiratory complications will improve Outcome: Progressing Goal: Cardiovascular complication will be avoided Outcome: Progressing   Problem: Nutrition: Goal: Adequate nutrition will be maintained Outcome: Progressing   Problem: Elimination: Goal: Will not experience complications related to bowel motility Outcome: Progressing   Problem: Pain Managment: Goal: General experience of comfort will improve and/or be controlled Outcome: Progressing   Problem: Safety: Goal: Ability to remain free from injury will improve Outcome: Progressing   Problem: Skin Integrity: Goal: Risk for impaired skin integrity will decrease Outcome: Progressing   "

## 2024-09-14 NOTE — Progress Notes (Signed)
 [facility]  PROGRESS NOTE  Brenda [name] FMW:[id number] DOB: 04/21/1961 DOA: 07/15/2024 PCP: [facility]   Brief HPI:    64 y.o. female with PMH significant for metastatic endometrial cancer, chronic cancer-related pain, DM2, HTN, HLD, obesity, OSA, CAD/stent, CHF, CVA, h/o DVT and PE anticoagulated on Lovenox , chronic hypoxic respiratory failure on 2L via nasal cannula. Recently hospitalized 11/9 -11/11 for UTI, treated with IV antibiotics and discharged on oral antibiotics. Next day 11/12, patient presented to [facility] ED with chills and weakness.   She was found to have UTI and was started on IV cefepime .   Unfortunately mental status worsened  11/15, patient was noted to have a stiffening of upper extremities, she was transferred to [facility] for continuous EEG monitoring. 11/16, she was noted to have seizures and video EEG. She was started on Keppra . Meningitis/encephalitis was suspected and patient was started on empiric antibiotics.  CSF panel unremarkable and hence antibiotics were stopped  12/7, because of development of rigidity, neurology and oncology were reconsulted.  Palliative care consulted as well for cancer-related pain      Assessment/Plan:    Seizures Workup as above.  Currently on Keppra . Seizure precautions   Acute metabolic encephalopathy  Generalized rigidity Patient had significant altered mental status and generalized rigidity. Multiple possible etiologies were considered.   Neurology and oncology consult appreciated.  Cefepime  was discontinued.  Prozac  was discontinued.  EEG did not show epileptiform abnormalities.  CT head without acute intracranial abnormality.  MRI brain showed evolving infarcts. MRI C spine did not show any evidence of metastatic disease.  Other possible etiologies considered were - neurotoxicity from Keytruda  which was being used to treat her cancer.  Keytruda  was stopped.  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 S/p high dose steroids 12/9-12/11.  S/p trial sinimet.  S/p baclofen .   S/p 5 days of PLEX, completed on 12/28  Per neurology note from 12/29, likely favor an autoimmune process.  Mental status also influenced probably by pain regimen as well as hospital induced delirium.   Overall, patient's mental status seems to be gradually improving.  Neurology has signed off. Oncology and palliative care following. Family wants to continue aggressive care. Status post PEG tube placement on 09/08/2024  Anxiety -Improved after starting BuSpar  5 mg p.o. twice daily - Continue Ativan  1 mg IV every 4 hours as needed    Metastatic Endometrial Cancer Cancer Related Pain  Seen by oncology and palliative care. She had had multiple imagings done during this hospitalization.  Per oncology note, there is no clear evidence of progression of the metastatic carcinoma. Keytruda  on hold as explained above. Pain regimen --- Scheduled: Oxycodone  5 mg every 8 hours. Reduced to BID today. --- PRN: IV Dilaudid , oxycodone , Tylenol     Recent DVT/PE,Splenic Infarct In October 2025, patient was diagnosed to have right leg DVT, PE as well as a splenic infarct and she was started on anticoagulation.   This hospitalization, anticoagulation was held due to psoas hematoma and GI bleed.   Currently only on DVT prophylaxis  Right hand/wrist swelling -Improved -X-ray of right hand could not be obtained as patient was not cooperative with technician -Pain and swelling improved after starting diclofenac  gel 4 times a day for 2 days 1/11 Lasix  40 mg x 1 dose IV given.  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  Patient had BRBPR during this hospitalization BRBPR resolved at this time CT GI bleed study did not show any evidence GI bleed. Patient was given 1 unit of PRBC transfusion on 12/1.  Also  given IV Venofer  -Hemoglobin was 8.1 yesterday, status post 1 unit PRBC per hematology yesterday, today hemoglobin is 10.8 Anticoagulation on hold. On DVT prophylaxis with Lovenox    Recurrent UTI  - Patient had Klebsiella pneumonia UTI in December which was treated with antibiotics for 5 days  - Had low-grade fever, UA was abnormal, urine culture again obtained and has grown Klebsiella pneumoniae -Started on ceftriaxone  2 g IV daily -Will treat with antibiotics for 5 days   Dysphagia Moderate malnutrition With altered mental status, patient was not able to to take oral intake for several days.  She is now eating some but very minimal and not enough to sustain her nutrition.  She has been receiving tube feeds through [employer] therapist and dietitian following.  IR consulted for PEG tube placement PEG tube placed on 09/08/2024   Type 2 diabetes mellitus A1c 7.2 on 06/01/2024 S/p Lantus  15 units twice daily, insulin  aspart SSI every 4 hours  Continue to monitor 1/12 increased Lantus  20 units SQ twice daily   Hypertension 1/1, held previously ordered midodrine  5 mg twice daily and propranolol  10 mg 3 times daily  Blood pressure target less than 150 systolic.  Continue to monitor    Depression/anxiety Prozac  discontinued due to rigidity -Continue Ativan  as needed   H/o CAD, Stroke HLD Currently on Lipitor  80 mg daily.  Anticoagulation on hold as discussed above   Abnormal Thyroid  Function Tests It seems early in the course of hospitalization, TSH level was low and free T4 level was elevated at 1.41 on 11/16 Patient was started on propranolol  20 mg 3 times daily.  Subsequent repeat TSH level normal at 0.9 on 12/7 Propranolol  has been intermittently held because of low blood pressure.  1/1,  stopped propranolol .      DVT prophylaxis Lovenox   Medications     atorvastatin   80 mg Per Tube QHS   busPIRone   5 mg Oral BID   Chlorhexidine  Gluconate Cloth  6 each Topical  Daily   enoxaparin  (LOVENOX ) injection  40 mg Subcutaneous Q24H   fentaNYL   1 patch Transdermal Q72H   FLUoxetine   40 mg Per Tube Daily   free water   100 mL Per Tube Q4H   gabapentin   100 mg Per Tube BID   insulin  aspart  0-9 Units Subcutaneous Q4H   insulin  glargine-yfgn  15 Units Subcutaneous BID   levETIRAcetam   500 mg Per Tube BID   lidocaine   1 patch Transdermal Daily   nystatin   100,000 Units Topical TID   mouth rinse  15 mL Mouth Rinse 4 times per day   oxyCODONE   5 mg Per Tube BID   pantoprazole  (PROTONIX ) IV  40 mg Intravenous Q12H   polyethylene glycol  17 g Per Tube Daily   sodium chloride  flush  10-40 mL Intracatheter Q12H   thrombin  5,000 units for percutaneous treatment of pseudoaneurysm   Percutaneous Once     Data Reviewed:   CBG:  Recent Labs  Lab 09/13/24 2030 09/13/24 2332 09/14/24 0345 09/14/24 0758 09/14/24 1138  GLUCAP 285* 381* 295* 345* 352*    SpO2: 100 % O2 Flow Rate (L/min): 2 L/min FiO2 (%): 21 %    Vitals:   09/13/24 2331 09/14/24 0347 09/14/24 0738 09/14/24 1134  BP: (!) 114/58 (!) 113/56 ROLLEN)  113/58 120/69  Pulse: (!) 57 (!) 54 (!) 56 79  Resp: 16 16 15    Temp: 98.1 F (36.7 C) 97.6 F (36.4 C) 98.2 F (36.8 C) 97.7 F (36.5 C)  TempSrc: Oral Oral Oral Oral  SpO2: 100% 100% 100% 100%  Weight:      Height:          Data Reviewed:  Basic Metabolic Panel: Recent Labs  Lab 09/09/24 0600  NA 140  K 4.0  CL 103  CO2 30  GLUCOSE 148*  BUN 9  CREATININE 0.33*  CALCIUM  9.1    CBC: Recent Labs  Lab 09/08/24 1056 09/09/24 0600  WBC 7.6 7.2  NEUTROABS 6.1 5.2  HGB 10.8* 10.5*  HCT 33.3* 32.9*  MCV 95.4 95.6  PLT 293 284    LFT Recent Labs  Lab 09/09/24 0600  AST 15  ALT 14  ALKPHOS 91  BILITOT 0.4  PROT 5.6*  ALBUMIN  3.4*     Antibiotics: Anti-infectives (From admission, onward)    Start     Dose/Rate Route Frequency Ordered Stop   09/09/24 0800  cefTRIAXone  (ROCEPHIN ) 2 g in sodium chloride  0.9 %  100 mL IVPB        2 g 200 mL/hr over 30 Minutes Intravenous Every 24 hours 09/09/24 0624 09/12/24 0818   09/08/24 0831  ceFAZolin  (ANCEF ) IVPB 2g/100 mL premix        over 30 Minutes Intravenous Continuous PRN 09/08/24 0831 09/08/24 0856   08/16/24 1100  cefTRIAXone  (ROCEPHIN ) 2 g in sodium chloride  0.9 % 100 mL IVPB        2 g 200 mL/hr over 30 Minutes Intravenous Every 24 hours 08/16/24 1001 08/20/24 1214   07/23/24 0600  vancomycin  (VANCOCIN ) IVPB 1000 mg/200 mL premix  Status:  Discontinued        1,000 mg 200 mL/hr over 60 Minutes Intravenous Every 12 hours 07/22/24 1834 07/24/24 1808   07/22/24 2200  meropenem  (MERREM ) 2 g in sodium chloride  0.9 % 100 mL IVPB  Status:  Discontinued        2 g 280 mL/hr over 30 Minutes Intravenous Every 8 hours 07/22/24 1601 07/24/24 1808   07/22/24 1700  vancomycin  (VANCOREADY) IVPB 1500 mg/300 mL        1,500 mg 150 mL/hr over 120 Minutes Intravenous  Once 07/22/24 1601 07/22/24 2031   07/22/24 1645  ampicillin  (OMNIPEN) 2 g in sodium chloride  0.9 % 100 mL IVPB  Status:  Discontinued        2 g 300 mL/hr over 20 Minutes Intravenous Every 6 hours 07/22/24 1557 07/24/24 1800   07/19/24 0530  meropenem  (MERREM ) 1 g in sodium chloride  0.9 % 100 mL IVPB  Status:  Discontinued        1 g 200 mL/hr over 30 Minutes Intravenous Every 8 hours 07/19/24 0438 07/22/24 1601   07/16/24 0900  ceFEPIme  (MAXIPIME ) 2 g in sodium chloride  0.9 % 100 mL IVPB  Status:  Discontinued        2 g 200 mL/hr over 30 Minutes Intravenous Every 8 hours 07/16/24 0811 07/19/24 0409   07/16/24 0815  ceFEPIme  (MAXIPIME ) 2 g in sodium chloride  0.9 % 100 mL IVPB  Status:  Discontinued        2 g 200 mL/hr over 30 Minutes Intravenous  Once 07/16/24 0803 07/16/24 0811   07/16/24 0330  cefTRIAXone  (ROCEPHIN ) 2 g in sodium chloride  0.9 % 100 mL IVPB  2 g 200 mL/hr over 30 Minutes Intravenous  Once 07/16/24 0315 07/16/24 0412        CONSULTS neurology, oncology  Code  Status: DNR  Family Communication: Discussed with patient's daughter at bedside     Subjective   Patient was seen and examined during morning rounds.   C/o being cold and jittery, no new other complaints.   Objective    Physical Examination:  Appears in no acute distress S1-S2, regular, no murmur auscultated Lungs clear to auscultation bilaterally Extremities-mild edema in the right hand    Wound 07/25/24 1100 Pressure Injury Sacrum Medial Deep Tissue Pressure Injury - Purple or maroon localized area of discolored intact skin or blood-filled blister due to damage of underlying soft tissue from pressure and/or shear. (Active)     Wound 08/11/24 0415 Pressure Injury Vertebral column Medial Stage 2 -  Partial thickness loss of dermis presenting as a shallow open injury with a red, pink wound bed without slough. (Active)     Wound 08/31/24 2100 Pressure Injury Thigh Left;Posterior Stage 1 -  Intact skin with non-blanchable redness of a localized area usually over a bony prominence. (Active)   Brenda [name] Von   [facility] If 7PM-7AM, please contact night-coverage at [email or web address], Office  [phone]  09/14/2024, 3:12 PM  LOS: 59 days

## 2024-09-14 NOTE — Progress Notes (Signed)
 IP PROGRESS NOTE  Subjective:   Brenda [name] was lethargic when I saw her at approximately 6:30 AM.  Her daughter was at the bedside.  Her daughter and the bedside RN reports she had been conversant prior to receiving Ativan  last night. Objective: Vital signs in last 24 hours: Blood pressure 120/69, pulse 79, temperature 97.7 F (36.5 C), temperature source Oral, resp. rate 15, height 5' 2 (1.575 m), weight 186 lb 4.6 oz (84.5 kg), last menstrual period 10/30/2014, SpO2 100%.  Intake/Output from previous day: 01/11 0701 - 01/12 0700 In: 600 [NG/GT:600] Out: 3500 [Urine:3500]  Physical Exam: She appears comfortable  Neurologic: Lethargic, arousable, speaks a few words, follows some commands, opens mouth, mild right hand grip strength, moves right foot/toes to commands, withdraws left foot to noxious stimuli, not moving the left hand abdomen: Soft, nontender, upper abdomen feeding tube   Portacath/PICC-without erythema  Lab Results: No results for input(s): WBC, HGB, HCT, PLT in the last 72 hours.   BMET No results for input(s): NA, K, CL, CO2, GLUCOSE, BUN, CREATININE, CALCIUM  in the last 72 hours.     Medications: I have reviewed the patient's current medications.  Assessment/Plan:  Endometrial cancer Diagnosed in 2023, status post surgery and radiation Recurrent disease July 2025 with a left retroperitoneal mass, biopsy confirmed adenocarcinoma, ER positive, MSI-high, tumor mutation burden 24, PD-L1 75%, PIK 3CA mutated, PTEN mutated, HER2 1+, loss of MLH1 expression 04/03/2024 CT chest: Spiculated 9 x 12 mm right upper lobe and part solid 4 x 6 mm right upper lobe nodules Cycle 1 paclitaxel /carboplatin /pembrolizumab  05/01/2024 Cycle 2 paclitaxel /carboplatin /pembrolizumab  06/30/2024 06/19/2024 CT abdomen/pelvis: Right lower lobe pulmonary embolism, splenic infarct, stable size of the left psoas mass with evidence of erosive change at the anterior  aspect of L2, decreased size of left external iliac node 08/09/2024 CTs: New psoas muscle mass (tumor versus hematoma), left psoas/paraspinal mass has decreased in size, by my review the previously noted lung nodules have resolved 08/18/2024 CT angiogram abdomen/pelvis-no evidence of active GI bleed, right psoas hematoma, L2 sclerotic change with left paraspinal mass  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  DVT 06/02/2024 4.  Pulmonary embolism noted on CT chest 06/19/2024 5.  Diabetes 6.  CHF/CAD 7.  Seizures, cefepime  toxicity?  November 2025 8.  Progressive generalized loss of motor function, altered mental status-related to CVAs?,  Paraneoplastic?  Toxicity from immunotherapy? 08/11/2024-Solu-Medrol  daily x 3 Therapy plasma exchange 08/21/2024, 08/23/2024, 08/25/2024, 08/27/2024, 08/29/2024 9.  History of respiratory failure 10.  Pain secondary to #1 11.  Anemia-multifactorial, 1 unit RBCs 09/07/2024 12.  History of elevated liver enzymes, now normal   Brenda [name] has a history of endometrial cancer initially diagnosed in 2020.  She was diagnosed with recurrent disease involving a left retroperitoneal mass in July 2025.  She completed 2 treatments with paclitaxel /carboplatin /pembrolizumab .  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  She completed 2 cycles of systemic therapy for treatment of the uterine cancer.  I reviewed the 08/09/2024 restaging CT findings with Ms. Mastrogiovanni and her daughter.  I reviewed the 08/18/2024 CT angiogram abdomen/pelvis.  The known malignancy at the left retroperitoneum has improved significantly.  The right psoas mass appears to represent a  hematoma.  The etiology of the diffuse generalized weakness remains unclear.  There is likely a component related to the embolic CVAs and deconditioning, but the evolution of the loss of motor function  is more severe than expected.  She has undergone an extensive neurologic evaluation. Neurology is following her in the hospital..  Neurology feels her symptoms may be related to PD-1 inhibitor neurotoxicity, a paraneoplastic syndrome, or another inflammatory process.  She started high-dose Solu-Medrol  on 08/11/2024, given daily for 3 days.  Her neurologic status did not improve.  She began plasma exchange on 08/21/2024.  She completed treatment #5 on 08/29/2024.  Her neurologic status has partially improved.  She is more alert and conversant with increased movement of the extremities.   The etiology of her pain is unclear based on the restaging CT.  The retroperitoneal mass is much smaller.  She could be having pain from the right psoas lesion or another etiology.  There is no clear evidence for progression of the metastatic carcinoma.  I recommend attempting to wean the CNS acting agents including narcotics .She appeared comfortable and denied pain when I saw her this morning.  She has persistent anemia.  The anemia is multifactorial including components related to bleeding, phlebotomy, and chronic disease.  She will not be a candidate for further treatment of the metastatic endometrial cancer unless her neurologic status and overall performance status continue to improve.  The loss of MLH1/PMS2 expression on the retroperitoneal biopsy from August 2025 is very likely to indicate a sporadic tumor.  Germline testing could not be obtained while she is in the hospital.  I updated her daughter at the bedside.  I am available to speak with her husband as needed.  Recommendations: Wean narcotics and other CNS acting agents as tolerated Evaluation/management of neurologic status per  neurology Anticoagulation with prophylactic dose Lovenox  08/31/2024 tube feedings, repeat speech pathology evaluation- advance diet as tolerated Systemic treatment for the uterine cancer will remain on hold Continue goals of care discussions by palliative care and the medical service I will check on her periodically this week, please call as needed           LOS: 59 days   Arley Hof, MD   09/14/2024, 1:52 PM

## 2024-09-14 NOTE — Plan of Care (Signed)
" °  Problem: Coping: Goal: Ability to adjust to condition or change in health will improve Outcome: Progressing   Problem: Tissue Perfusion: Goal: Adequacy of tissue perfusion will improve Outcome: Progressing   Problem: Education: Goal: Ability to describe self-care measures that may prevent or decrease complications (Diabetes Survival Skills Education) will improve Outcome: Not Progressing   Problem: Metabolic: Goal: Ability to maintain appropriate glucose levels will improve Outcome: Not Progressing   "

## 2024-09-14 NOTE — Progress Notes (Signed)
 Nutrition Follow-up  DOCUMENTATION CODES:  Non-severe (moderate) malnutrition in context of chronic illness  INTERVENTION:  Continue TF via PEG, adjust to bolus feeds on 1/13: Glucerna 1.5 bolus feeds, 4 cartons per day (948 ml per day) Prosource TF20 1x/d Free water : 150 mL 5x/d with bolus (75mL before and after each bolus + 1 additional fluid bolus) Provides 1504 kcal, 98 gm protein, 820 ml free water  daily ( fee water  TF+flush) Adjust CBG checks Continue current diet as ordered, encourage PO intake Monitor GOC discussions with PMT  NUTRITION DIAGNOSIS:  Moderate Malnutrition related to chronic illness (cancer) as evidenced by percent weight loss, mild muscle depletion, moderate muscle depletion (12% x 6 months). - remains applicable  GOAL:  Patient will meet greater than or equal to 90% of their needs - being addressed with tube feeds  MONITOR:  Weight trends, TF tolerance, I & O's, Labs, Diet advancement  REASON FOR ASSESSMENT:  Consult Enteral/tube feeding initiation and management  ASSESSMENT:  Pt with hx of HTN, HLD, CAD, CHF, hx CVA, uterine cancer s/p hysterectomy and adjuvant radiation therapy in 2023 with recurrance with mets in July 2025, hx MI x 3, and DM type 1 presented to [facility] ED with weakness after a recent admission from 11/9-11/11. Pt had seizures during admission and was transferred to [facility] for continuous EEG.  11/13 - presented to [facility] ED with chills and weakness after recent admission to [facility] 11/15 - transferred to [facility] 11/19 - cortrak placed (gastric) 12/2 - SLP BSE, NPO 12/4 - MBS, DYS1, thin liquids 12/9 - SLP BSE, regular diet to promote PO intake 12/16 - TF on hold for GI bleed, changed to glucerna 1.5 when able to restart 12/19 - first PLEX treatment (planned for 5) 12/27 - fifth (final) PLEX treatment 1/2 - Family agreed to PEG placement, IR consulted 1/6 - PEG placement in IR  Pt resting in bed at the  time of assessment, RN giving pain medications.   Husband available to provide hx. Had videos on his phone to share where pt was awake and alert enough to take in a little PO 2 days ago. Had broccoli and spaghetti and able to drink some from a straw. Reports that pt tends to do well as pain medication is weaning off but before pain is out of control. Discussed the possibility of adjusting TF regimen to bolus feeds to allow time off the pump for more rest and better glycemic control.   Glucose very high, received a dose of steroids yesterday.  Discussed with MD and DM coordinator  Admit weight: 77.9 kg Current weight: 84.5 kg   12% weight loss noted in the last 6 months to admission (since cancer recurrence) which is severe for timeframe  Nutritionally Relevant Medications: Scheduled Meds:  atorvastatin   80 mg Per Tube QHS   free water   100 mL Per Tube Q4H   insulin  aspart  0-9 Units Subcutaneous Q4H   insulin  glargine-yfgn  15 Units Subcutaneous BID   levETIRAcetam   500 mg Per Tube BID   methylphenidate   5 mg Per Tube BID WC   pantoprazole  IV  40 mg Intravenous Q12H   polyethylene glycol  17 g Per Tube Daily   Continuous Infusions:  feeding supplement (GLUCERNA 1.5 CAL) 1,000 mL (09/10/24 2102)   PRN Meds: ondansetron , prochlorperazine , senna-docusate  Labs Reviewed: CBG ranges from 68-170 mg/dL over the last 24 hours HgbA1c 7.2% (06/01/24)  NUTRITION - FOCUSED PHYSICAL EXAM: Flowsheet Row Most  Recent Value  Orbital Region No depletion  Upper Arm Region No depletion  Thoracic and Lumbar Region No depletion  Buccal Region No depletion  Temple Region Mild depletion  Clavicle Bone Region Mild depletion  Clavicle and Acromion Bone Region Mild depletion  Scapular Bone Region Mild depletion  Dorsal Hand No depletion  Patellar Region Moderate depletion  Anterior Thigh Region Moderate depletion  Posterior Calf Region Moderate depletion  Edema (RD Assessment) Moderate  [BLE, BUE]   Hair Reviewed  Eyes Reviewed  Mouth Reviewed  Skin Reviewed  Nails Reviewed    Diet Order:   Diet Order             Diet regular Room service appropriate? Yes; Fluid consistency: Thin  Diet effective now                   EDUCATION NEEDS:  Education needs have been addressed  Skin:  Skin Assessment: Reviewed RN Assessment Stage 1:  - left thigh (0.5 x 0.5 cm)  Last BM:  1/6 - type 6  Height:  Ht Readings from Last 1 Encounters:  08/21/24 5' 2 (1.575 m)    Weight:  Wt Readings from Last 1 Encounters:  09/08/24 84.5 kg    Ideal Body Weight:  50 kg  BMI:  Body mass index is 34.07 kg/m.  Estimated Nutritional Needs:  Kcal:  1600-1800 kcal/d Protein:  75-90 g/d Fluid:  >/=1.6L/d    Vernell Lukes, RD, LDN, CNSC Registered Dietitian II Please reach out via secure chat

## 2024-09-14 NOTE — Inpatient Diabetes Management (Signed)
 Inpatient Diabetes Program Recommendations  AACE/ADA: New Consensus Statement on Inpatient Glycemic Control (2015)  Target Ranges:  Prepandial:   less than 140 mg/dL      Peak postprandial:   less than 180 mg/dL (1-2 hours)      Critically ill patients:  140 - 180 mg/dL   Lab Results  Component Value Date   GLUCAP 352 (H) 09/14/2024   HGBA1C 7.2 (H) 06/01/2024    Review of Glycemic Control  Latest Reference Range & Units 09/13/24 20:30 09/13/24 23:32 09/14/24 03:45 09/14/24 07:58 09/14/24 11:38  Glucose-Capillary 70 - 99 mg/dL 714 (H) 618 (H) 704 (H) 345 (H) 352 (H)   Diabetes history: DM 2 Outpatient Diabetes medications:  Fiasp  1-3 units tid with meals Tresiba  25 units daily Metformin  1000 mg bid Current orders for Inpatient glycemic control:  Glucerna 45 ml/hr Novolog  0-9 units q 4 hours Semglee  15 units bid  Inpatient Diabetes Program Recommendations:   Note patient did receive Solumedrol 40 mg x 1 on 09/13/24.  If appropriate, consider adding Novolog  3 units q 4 hours to cover tube feeds.   Thanks,  Randall Bullocks, RN, BC-ADM Inpatient Diabetes Coordinator Pager [phone]  (8a-5p)

## 2024-09-15 DIAGNOSIS — G9341 Metabolic encephalopathy: Secondary | ICD-10-CM | POA: Diagnosis not present

## 2024-09-15 LAB — CBC
HCT: 30.5 % — ABNORMAL LOW (ref 36.0–46.0)
Hemoglobin: 9.8 g/dL — ABNORMAL LOW (ref 12.0–15.0)
MCH: 30.4 pg (ref 26.0–34.0)
MCHC: 32.1 g/dL (ref 30.0–36.0)
MCV: 94.7 fL (ref 80.0–100.0)
Platelets: 230 K/uL (ref 150–400)
RBC: 3.22 MIL/uL — ABNORMAL LOW (ref 3.87–5.11)
RDW: 15.4 % (ref 11.5–15.5)
WBC: 6.8 K/uL (ref 4.0–10.5)
nRBC: 0 % (ref 0.0–0.2)

## 2024-09-15 LAB — GLUCOSE, CAPILLARY
Glucose-Capillary: 121 mg/dL — ABNORMAL HIGH (ref 70–99)
Glucose-Capillary: 57 mg/dL — ABNORMAL LOW (ref 70–99)
Glucose-Capillary: 122 mg/dL — ABNORMAL HIGH (ref 70–99)
Glucose-Capillary: 203 mg/dL — ABNORMAL HIGH (ref 70–99)
Glucose-Capillary: 251 mg/dL — ABNORMAL HIGH (ref 70–99)
Glucose-Capillary: 231 mg/dL — ABNORMAL HIGH (ref 70–99)

## 2024-09-15 LAB — BASIC METABOLIC PANEL WITH GFR
Anion gap: 6 (ref 5–15)
BUN: 14 mg/dL (ref 8–23)
CO2: 33 mmol/L — ABNORMAL HIGH (ref 22–32)
Calcium: 9.1 mg/dL (ref 8.9–10.3)
Chloride: 100 mmol/L (ref 98–111)
Creatinine, Ser: <0.3 mg/dL — ABNORMAL LOW (ref 0.44–1.00)
Glucose, Bld: 78 mg/dL (ref 70–99)
Potassium: 4.1 mmol/L (ref 3.5–5.1)
Sodium: 139 mmol/L (ref 135–145)

## 2024-09-15 LAB — PHOSPHORUS: Phosphorus: 3.9 mg/dL (ref 2.5–4.6)

## 2024-09-15 LAB — MAGNESIUM: Magnesium: 1.6 mg/dL — ABNORMAL LOW (ref 1.7–2.4)

## 2024-09-15 MED ORDER — DEXTROSE 50 % IV SOLN
12.5000 g | INTRAVENOUS | Status: AC
  Administered 2024-09-15: 12.5 g via INTRAVENOUS
  Filled 2024-09-15: qty 50

## 2024-09-15 MED ORDER — MAGNESIUM SULFATE 2 GM/50ML IV SOLN
2.0000 g | Freq: Once | INTRAVENOUS | Status: AC
  Administered 2024-09-15: 2 g via INTRAVENOUS
  Filled 2024-09-15: qty 50

## 2024-09-15 MED ORDER — INSULIN GLARGINE 100 UNIT/ML ~~LOC~~ SOLN
15.0000 [IU] | Freq: Two times a day (BID) | SUBCUTANEOUS | Status: DC
  Administered 2024-09-15 – 2024-09-18 (×8): 15 [IU] via SUBCUTANEOUS
  Filled 2024-09-15 (×10): qty 0.15

## 2024-09-15 MED ORDER — INSULIN ASPART 100 UNIT/ML IJ SOLN
0.0000 [IU] | Freq: Three times a day (TID) | INTRAMUSCULAR | Status: DC
  Administered 2024-09-15 – 2024-09-16 (×2): 5 [IU] via SUBCUTANEOUS
  Administered 2024-09-16: 3 [IU] via SUBCUTANEOUS
  Administered 2024-09-16: 5 [IU] via SUBCUTANEOUS
  Administered 2024-09-17: 2 [IU] via SUBCUTANEOUS
  Filled 2024-09-15: qty 5
  Filled 2024-09-15 (×2): qty 3
  Filled 2024-09-15: qty 5
  Filled 2024-09-15 (×2): qty 2

## 2024-09-15 NOTE — Progress Notes (Signed)
 SPIRITUAL CARE AND COUNSELING CONSULT NOTE   VISIT SUMMARY Chaplain visited Pt at bedside; Pt was resting at time of the visit. Chaplain spoke with Pt briefly, honoring the [name] need for rest, and need of  emotional  support.  SPIRITUAL ENCOUNTER                                                                                                                                                                      Type of Visit: Initial Care provided to:: Patient Referral source: Nurse (RN/NT/LPN) Reason for visit: Routine spiritual support OnCall Visit: No   SPIRITUAL FRAMEWORK  Presenting Themes: Impactful experiences and emotions Patient Stress Factors: Health changes Family Stress Factors: None identified   GOALS       INTERVENTIONS   Spiritual Care Interventions Made: Compassionate presence, Established relationship of care and support, Normalization of emotions    INTERVENTION OUTCOMES   Outcomes: Awareness of support, Reduced anxiety  SPIRITUAL CARE PLAN     Chaplain plan to follow up as appropriate.   If immediate needs arise, please contact [facility] 24 hour on call [phone]   Christopher LITTIE Kiang, Chaplain  09/15/2024 3:11 PM

## 2024-09-15 NOTE — Plan of Care (Signed)
" °  Problem: Coping: Goal: Ability to adjust to condition or change in health will improve Outcome: Progressing   Problem: Fluid Volume: Goal: Ability to maintain a balanced intake and output will improve Outcome: Progressing   Problem: Metabolic: Goal: Ability to maintain appropriate glucose levels will improve Outcome: Progressing   Problem: Nutritional: Goal: Maintenance of adequate nutrition will improve Outcome: Progressing   Problem: Skin Integrity: Goal: Risk for impaired skin integrity will decrease Outcome: Progressing   Problem: Tissue Perfusion: Goal: Adequacy of tissue perfusion will improve Outcome: Progressing   Problem: Education: Goal: Knowledge of General Education information will improve Description: Including pain rating scale, medication(s)/side effects and non-pharmacologic comfort measures Outcome: Progressing   Problem: Clinical Measurements: Goal: Ability to maintain clinical measurements within normal limits will improve Outcome: Progressing Goal: Will remain free from infection Outcome: Progressing Goal: Diagnostic test results will improve Outcome: Progressing Goal: Respiratory complications will improve Outcome: Progressing Goal: Cardiovascular complication will be avoided Outcome: Progressing   Problem: Activity: Goal: Risk for activity intolerance will decrease Outcome: Progressing   Problem: Elimination: Goal: Will not experience complications related to bowel motility Outcome: Progressing Goal: Will not experience complications related to urinary retention Outcome: Progressing   Problem: Safety: Goal: Ability to remain free from injury will improve Outcome: Progressing   Problem: Skin Integrity: Goal: Risk for impaired skin integrity will decrease Outcome: Progressing   "

## 2024-09-15 NOTE — Progress Notes (Signed)
 Chaplain attempted to complete [facility] consult for prayer. Pt was being attended to by medical team. Our team will attempt visit at a later time. Please page if the need is urgent.  Thank you for the consult.  Alan HERO. Davee Lomax, M.Div. [facility] Chaplain Pager [phone] Office [phone]

## 2024-09-15 NOTE — Progress Notes (Signed)
 [facility]  PROGRESS NOTE  Brenda [name] FMW:[id number] DOB: 01/04/1961 DOA: 07/15/2024 PCP: [facility]   Brief HPI:    64 y.o. female with PMH significant for metastatic endometrial cancer, chronic cancer-related pain, DM2, HTN, HLD, obesity, OSA, CAD/stent, CHF, CVA, h/o DVT and PE anticoagulated on Lovenox , chronic hypoxic respiratory failure on 2L via nasal cannula. Recently hospitalized 11/9 -11/11 for UTI, treated with IV antibiotics and discharged on oral antibiotics. Next day 11/12, patient presented to [facility] ED with chills and weakness.   She was found to have UTI and was started on IV cefepime .   Unfortunately mental status worsened  11/15, patient was noted to have a stiffening of upper extremities, she was transferred to [facility] for continuous EEG monitoring. 11/16, she was noted to have seizures and video EEG. She was started on Keppra . Meningitis/encephalitis was suspected and patient was started on empiric antibiotics.  CSF panel unremarkable and hence antibiotics were stopped  12/7, because of development of rigidity, neurology and oncology were reconsulted.  Palliative care consulted as well for cancer-related pain      Assessment/Plan:    Seizures Workup as above.  Currently on Keppra . Seizure precautions   Acute metabolic encephalopathy  Generalized rigidity Patient had significant altered mental status and generalized rigidity. Multiple possible etiologies were considered.   Neurology and oncology consult appreciated.  Cefepime  was discontinued.  Prozac  was discontinued.  EEG did not show epileptiform abnormalities.  CT head without acute intracranial abnormality.  MRI brain showed evolving infarcts. MRI C spine did not show any evidence of metastatic disease.  Other possible etiologies considered were - neurotoxicity from Keytruda  which was being used to treat her cancer.  Keytruda  was stopped.  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 S/p high dose steroids 12/9-12/11.  S/p trial sinimet.  S/p baclofen .   S/p 5 days of PLEX, completed on 12/28  Per neurology note from 12/29, likely favor an autoimmune process.  Mental status also influenced probably by pain regimen as well as hospital induced delirium.   Overall, patient's mental status seems to be gradually improving.  Neurology has signed off. Oncology and palliative care following. Family wants to continue aggressive care. Status post PEG tube placement on 09/08/2024  Anxiety -Improved after starting BuSpar  5 mg p.o. twice daily - Continue Ativan  1 mg IV every 4 hours as needed    Metastatic Endometrial Cancer Cancer Related Pain  Seen by oncology and palliative care. She had had multiple imagings done during this hospitalization.  Per oncology note, there is no clear evidence of progression of the metastatic carcinoma. Keytruda  on hold as explained above. Pain regimen --- Scheduled: Oxycodone  5 mg every 8 hours. Reduced to BID today. --- PRN: IV Dilaudid , oxycodone , Tylenol     Recent DVT/PE,Splenic Infarct In October 2025, patient was diagnosed to have right leg DVT, PE as well as a splenic infarct and she was started on anticoagulation.   This hospitalization, anticoagulation was held due to psoas hematoma and GI bleed.   Currently only on DVT prophylaxis  Right hand/wrist swelling -Improved -X-ray of right hand could not be obtained as patient was not cooperative with technician -Pain and swelling improved after starting diclofenac  gel 4 times a day for 2 days 1/11 Lasix  40 mg x 1 dose IV given.  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  Patient had BRBPR during this hospitalization BRBPR resolved at this time CT GI bleed study did not show any evidence GI bleed. Patient was given 1 unit of PRBC transfusion on 12/1.  Also  given IV Venofer  -Hemoglobin was 8.1 yesterday, status post 1 unit PRBC per hematology yesterday, today hemoglobin is 10.8 Anticoagulation on hold. On DVT prophylaxis with Lovenox    Recurrent UTI  - Patient had Klebsiella pneumonia UTI in December which was treated with antibiotics for 5 days  - Had low-grade fever, UA was abnormal, urine culture again obtained and has grown Klebsiella pneumoniae -Started on ceftriaxone  2 g IV daily -Will treat with antibiotics for 5 days   Dysphagia Moderate malnutrition With altered mental status, patient was not able to to take oral intake for several days.  She is now eating some but very minimal and not enough to sustain her nutrition.  She has been receiving tube feeds through [employer] therapist and dietitian following.  IR consulted for PEG tube placement PEG tube placed on 09/08/2024   Type 2 diabetes mellitus A1c 7.2 on 06/01/2024 S/p Lantus  15 units twice daily, insulin  aspart SSI every 4 hours  Continue to monitor 1/12 increased Lantus  20 units SQ twice daily   Hypertension 1/1, held previously ordered midodrine  5 mg twice daily and propranolol  10 mg 3 times daily  Blood pressure target less than 150 systolic.  Continue to monitor    Depression/anxiety Prozac  discontinued due to rigidity -Continue Ativan  as needed   H/o CAD, Stroke HLD Currently on Lipitor  80 mg daily.  Anticoagulation on hold as discussed above   Abnormal Thyroid  Function Tests It seems early in the course of hospitalization, TSH level was low and free T4 level was elevated at 1.41 on 11/16 Patient was started on propranolol  20 mg 3 times daily.  Subsequent repeat TSH level normal at 0.9 on 12/7 Propranolol  has been intermittently held because of low blood pressure.  1/1,  stopped propranolol .  Vaginal discharge, noticed by RN on 1/12, possible fungal infection 1/12 Diflucan  150 mg x 1 dose given May repeat dose after 72 hours if persistent vaginal  discharge.   Hypomagnesemia, mag repleted. Monitor electrolytes and replete as needed.     DVT prophylaxis Lovenox   Medications     atorvastatin   80 mg Per Tube QHS   busPIRone   5 mg Oral BID   Chlorhexidine  Gluconate Cloth  6 each Topical Daily   enoxaparin  (LOVENOX ) injection  40 mg Subcutaneous Q24H   feeding supplement (GLUCERNA 1.5 CAL)  237 mL Per Tube TID WC & HS   feeding supplement (PROSource TF20)  60 mL Per Tube Daily   fentaNYL   1 patch Transdermal Q72H   FLUoxetine   40 mg Per Tube Daily   free water   150 mL Per Tube 5 X Daily   gabapentin   100 mg Per Tube BID   insulin  aspart  0-9 Units Subcutaneous TID WC   insulin  glargine  15 Units Subcutaneous BID   levETIRAcetam   500 mg Per Tube BID   lidocaine   1 patch Transdermal Daily   nystatin   100,000 Units Topical TID   mouth rinse  15 mL Mouth Rinse 4 times per day   oxyCODONE   5 mg Per Tube BID   pantoprazole  (PROTONIX ) IV  40 mg Intravenous Q12H   polyethylene glycol  17 g Per Tube Daily   sodium chloride  flush  10-40 mL Intracatheter Q12H   thrombin  5,000 units for percutaneous treatment of pseudoaneurysm  Percutaneous Once     Data Reviewed:   CBG:  Recent Labs  Lab 09/14/24 2351 09/15/24 0357 09/15/24 0744 09/15/24 0856 09/15/24 1130  GLUCAP 155* 121* 57* 122* 203*    SpO2: 99 % O2 Flow Rate (L/min): 2 L/min FiO2 (%): 21 %    Vitals:   09/14/24 2349 09/15/24 0359 09/15/24 0742 09/15/24 1128  BP: 121/62 127/69 (!) 110/54 131/67  Pulse: 69 69 65 80  Resp: 17 18 16 16   Temp: 98 F (36.7 C) 98 F (36.7 C) 98.3 F (36.8 C) 98.7 F (37.1 C)  TempSrc: Oral Oral  Axillary  SpO2: 100% 100% 100% 99%  Weight:      Height:          Data Reviewed:  Basic Metabolic Panel: Recent Labs  Lab 09/09/24 0600 09/15/24 0533  NA 140 139  K 4.0 4.1  CL 103 100  CO2 30 33*  GLUCOSE 148* 78  BUN 9 14  CREATININE 0.33* <0.30*  CALCIUM  9.1 9.1  MG  --  1.6*  PHOS  --  3.9    CBC: Recent  Labs  Lab 09/09/24 0600 09/15/24 0533  WBC 7.2 6.8  NEUTROABS 5.2  --   HGB 10.5* 9.8*  HCT 32.9* 30.5*  MCV 95.6 94.7  PLT 284 230    LFT Recent Labs  Lab 09/09/24 0600  AST 15  ALT 14  ALKPHOS 91  BILITOT 0.4  PROT 5.6*  ALBUMIN  3.4*     Antibiotics: Anti-infectives (From admission, onward)    Start     Dose/Rate Route Frequency Ordered Stop   09/14/24 1730  fluconazole  (DIFLUCAN ) tablet 150 mg        150 mg Per Tube  Once 09/14/24 1643 09/14/24 1810   09/09/24 0800  cefTRIAXone  (ROCEPHIN ) 2 g in sodium chloride  0.9 % 100 mL IVPB        2 g 200 mL/hr over 30 Minutes Intravenous Every 24 hours 09/09/24 0624 09/12/24 0818   09/08/24 0831  ceFAZolin  (ANCEF ) IVPB 2g/100 mL premix        over 30 Minutes Intravenous Continuous PRN 09/08/24 0831 09/08/24 0856   08/16/24 1100  cefTRIAXone  (ROCEPHIN ) 2 g in sodium chloride  0.9 % 100 mL IVPB        2 g 200 mL/hr over 30 Minutes Intravenous Every 24 hours 08/16/24 1001 08/20/24 1214   07/23/24 0600  vancomycin  (VANCOCIN ) IVPB 1000 mg/200 mL premix  Status:  Discontinued        1,000 mg 200 mL/hr over 60 Minutes Intravenous Every 12 hours 07/22/24 1834 07/24/24 1808   07/22/24 2200  meropenem  (MERREM ) 2 g in sodium chloride  0.9 % 100 mL IVPB  Status:  Discontinued        2 g 280 mL/hr over 30 Minutes Intravenous Every 8 hours 07/22/24 1601 07/24/24 1808   07/22/24 1700  vancomycin  (VANCOREADY) IVPB 1500 mg/300 mL        1,500 mg 150 mL/hr over 120 Minutes Intravenous  Once 07/22/24 1601 07/22/24 2031   07/22/24 1645  ampicillin  (OMNIPEN) 2 g in sodium chloride  0.9 % 100 mL IVPB  Status:  Discontinued        2 g 300 mL/hr over 20 Minutes Intravenous Every 6 hours 07/22/24 1557 07/24/24 1800   07/19/24 0530  meropenem  (MERREM ) 1 g in sodium chloride  0.9 % 100 mL IVPB  Status:  Discontinued        1 g 200 mL/hr over 30 Minutes  Intravenous Every 8 hours 07/19/24 0438 07/22/24 1601   07/16/24 0900  ceFEPIme  (MAXIPIME ) 2 g in  sodium chloride  0.9 % 100 mL IVPB  Status:  Discontinued        2 g 200 mL/hr over 30 Minutes Intravenous Every 8 hours 07/16/24 0811 07/19/24 0409   07/16/24 0815  ceFEPIme  (MAXIPIME ) 2 g in sodium chloride  0.9 % 100 mL IVPB  Status:  Discontinued        2 g 200 mL/hr over 30 Minutes Intravenous  Once 07/16/24 0803 07/16/24 0811   07/16/24 0330  cefTRIAXone  (ROCEPHIN ) 2 g in sodium chloride  0.9 % 100 mL IVPB        2 g 200 mL/hr over 30 Minutes Intravenous  Once 07/16/24 0315 07/16/24 0412        CONSULTS neurology, oncology  Code Status: DNR  Family Communication: Discussed with patient's daughter at bedside     Subjective   Patient was seen and examined during morning rounds.   Patient was sleepy, dozing off, unable to keep eyes open.  Patient was unable to tell me her name today. Per patient daughter and RN she was awake in the morning time and was more oriented, she just took pain medications, may be dozing off due to opiates.    Objective    Physical Examination:  Appears in no acute distress S1-S2, regular, no murmur auscultated Lungs clear to auscultation bilaterally Extremities-mild edema in the right hand    Wound 07/25/24 1100 Pressure Injury Sacrum Medial Deep Tissue Pressure Injury - Purple or maroon localized area of discolored intact skin or blood-filled blister due to damage of underlying soft tissue from pressure and/or shear. (Active)     Wound 08/11/24 0415 Pressure Injury Vertebral column Medial Stage 2 -  Partial thickness loss of dermis presenting as a shallow open injury with a red, pink wound bed without slough. (Active)     Wound 08/31/24 2100 Pressure Injury Thigh Left;Posterior Stage 1 -  Intact skin with non-blanchable redness of a localized area usually over a bony prominence. (Active)   Gavrielle Streck Von   [facility] If 7PM-7AM, please contact night-coverage at [email or web address], Office  [phone]  09/15/2024, 1:29 PM  LOS: 60 days

## 2024-09-15 NOTE — Inpatient Diabetes Management (Signed)
 Inpatient Diabetes Program Recommendations  AACE/ADA: New Consensus Statement on Inpatient Glycemic Control (2015)  Target Ranges:  Prepandial:   less than 140 mg/dL      Peak postprandial:   less than 180 mg/dL (1-2 hours)      Critically ill patients:  140 - 180 mg/dL   Lab Results  Component Value Date   GLUCAP 203 (H) 09/15/2024   HGBA1C 7.2 (H) 06/01/2024    Review of Glycemic Control  Latest Reference Range & Units 09/15/24 03:57 09/15/24 07:44 09/15/24 08:56 09/15/24 11:30  Glucose-Capillary 70 - 99 mg/dL 878 (H) 57 (L) 877 (H) 203 (H)   Diabetes history: DM 2 Outpatient Diabetes medications:  Fiasp  1-3 units tid with meals Tresiba  25 units daily Metformin  1000 mg bid Current orders for Inpatient glycemic control:  Glucerna 237 ml 4 times a day Novolog  0-9 units q 4 hours Semglee  15 units bid  Inpatient Diabetes Program Recommendations:    Note low CBG this AM.  Continuous feeds were stopped at [place].   May need to change Novolog  correction to tid with meals and HS since now on bolus feeds (instead of q 4 hours).  If CBG's increased, may need bolus tube feed coverage prior to each bolus.    Thanks,  Randall Bullocks, RN, BC-ADM Inpatient Diabetes Coordinator Pager [phone]  (8a-5p)

## 2024-09-16 DIAGNOSIS — Z515 Encounter for palliative care: Secondary | ICD-10-CM | POA: Diagnosis not present

## 2024-09-16 DIAGNOSIS — Z66 Do not resuscitate: Secondary | ICD-10-CM | POA: Diagnosis not present

## 2024-09-16 DIAGNOSIS — N390 Urinary tract infection, site not specified: Secondary | ICD-10-CM | POA: Diagnosis not present

## 2024-09-16 DIAGNOSIS — G9341 Metabolic encephalopathy: Secondary | ICD-10-CM | POA: Diagnosis not present

## 2024-09-16 DIAGNOSIS — J9611 Chronic respiratory failure with hypoxia: Secondary | ICD-10-CM | POA: Diagnosis not present

## 2024-09-16 LAB — GLUCOSE, CAPILLARY
Glucose-Capillary: 280 mg/dL — ABNORMAL HIGH (ref 70–99)
Glucose-Capillary: 238 mg/dL — ABNORMAL HIGH (ref 70–99)
Glucose-Capillary: 251 mg/dL — ABNORMAL HIGH (ref 70–99)
Glucose-Capillary: 247 mg/dL — ABNORMAL HIGH (ref 70–99)

## 2024-09-16 LAB — T4, FREE: Free T4: 1.31 ng/dL (ref 0.80–2.00)

## 2024-09-16 LAB — TSH: TSH: 3.56 u[IU]/mL (ref 0.350–4.500)

## 2024-09-16 LAB — CBC
HCT: 32.7 % — ABNORMAL LOW (ref 36.0–46.0)
Hemoglobin: 10.8 g/dL — ABNORMAL LOW (ref 12.0–15.0)
MCH: 30.4 pg (ref 26.0–34.0)
MCHC: 33 g/dL (ref 30.0–36.0)
MCV: 92.1 fL (ref 80.0–100.0)
Platelets: 224 K/uL (ref 150–400)
RBC: 3.55 MIL/uL — ABNORMAL LOW (ref 3.87–5.11)
RDW: 15 % (ref 11.5–15.5)
WBC: 7.3 K/uL (ref 4.0–10.5)
nRBC: 0 % (ref 0.0–0.2)

## 2024-09-16 LAB — VITAMIN B12: Vitamin B-12: 961 pg/mL — ABNORMAL HIGH (ref 180–914)

## 2024-09-16 MED ORDER — ONDANSETRON HCL 4 MG/2ML IJ SOLN: 4.0000 mg | Freq: Four times a day (QID) | INTRAMUSCULAR | Status: AC | PRN

## 2024-09-16 MED ORDER — GUAIFENESIN 100 MG/5ML PO LIQD: 5.0000 mL | ORAL | Status: AC | PRN

## 2024-09-16 MED ORDER — IPRATROPIUM-ALBUTEROL 0.5-2.5 (3) MG/3ML IN SOLN: 3.0000 mL | RESPIRATORY_TRACT | Status: AC | PRN

## 2024-09-16 MED ORDER — FAMOTIDINE 20 MG PO TABS
20.0000 mg | ORAL_TABLET | Freq: Two times a day (BID) | ORAL | Status: DC
  Administered 2024-09-16 – 2024-09-17 (×2): 20 mg
  Filled 2024-09-16 (×2): qty 1

## 2024-09-16 MED ORDER — ACETAMINOPHEN 325 MG PO TABS
650.0000 mg | ORAL_TABLET | Freq: Four times a day (QID) | ORAL | Status: AC | PRN
  Administered 2024-09-16 – 2024-10-09 (×6): 650 mg via ORAL
  Filled 2024-09-16 (×6): qty 2

## 2024-09-16 MED ORDER — HYDRALAZINE HCL 20 MG/ML IJ SOLN
10.0000 mg | INTRAMUSCULAR | Status: AC | PRN
  Administered 2024-09-19 – 2024-10-08 (×8): 10 mg via INTRAVENOUS
  Filled 2024-09-16 (×8): qty 1

## 2024-09-16 MED ORDER — METOPROLOL TARTRATE 5 MG/5ML IV SOLN: 5.0000 mg | INTRAVENOUS | Status: AC | PRN

## 2024-09-16 MED ORDER — SENNOSIDES-DOCUSATE SODIUM 8.6-50 MG PO TABS: 1.0000 | ORAL_TABLET | Freq: Every evening | ORAL | Status: AC | PRN

## 2024-09-16 MED ORDER — LORAZEPAM 0.5 MG PO TABS
0.5000 mg | ORAL_TABLET | Freq: Four times a day (QID) | ORAL | Status: AC | PRN
  Administered 2024-09-16 – 2024-10-09 (×30): 0.5 mg
  Filled 2024-09-16 (×31): qty 1

## 2024-09-16 MED ORDER — GLUCAGON HCL RDNA (DIAGNOSTIC) 1 MG IJ SOLR
1.0000 mg | INTRAMUSCULAR | Status: AC | PRN
  Administered 2024-09-24: 1 mg via INTRAVENOUS
  Filled 2024-09-16: qty 1

## 2024-09-16 NOTE — Progress Notes (Signed)
 " PROGRESS NOTE    Brenda [name]  FMW:[id number] DOB: 1960-12-13 DOA: 07/15/2024 PCP: [facility]    Brief Narrative:  64 year old with history of metastatic endometrial cancer, chronic pain, DM 2, HTN, HLD, OSA, CAD, CHF, CVA, DVT/PE on Lovenox , chronic hypoxia on 2 L nasal cannula was recently hospitalized at [facility] on 11/9 - 11/11 for UTI treated with antibiotics discharged on p.o. presented back again to the hospital due to worsening mental status.  Eventually transferred to [facility] and EEG showed concerns of seizures therefore started on Keppra .  Also received empiric antibiotics for concerns of meningitis/encephalitis but CSF panel was negative therefore discontinued.  Assessment & Plan:  Acute metabolic encephalopathy Seizures Patient undergone extensive workup.  Seen by neurology and oncology.  Overall infectious etiologies were ruled out therefore antibiotics were eventually discontinued.  MRI brain showed concerns of evolving infarct.  There was also concerns of neurotoxicity secondary to Keytruda  therefore was discontinued. -Status post PLEX x 5 days completed Dec 12/28 -Status post high-dose steroids 12/9 - 12/11 -Now on Keppra  500 mg twice daily  Anxiety -Improved after starting BuSpar  5 mg p.o. twice daily - Continue Ativan  1 mg IV every 4 hours as needed     Metastatic Endometrial Cancer Cancer Related Pain  Seen by oncology and palliative care. She had had multiple imagings done during this hospitalization.  Per oncology note, there is no clear evidence of progression of the metastatic carcinoma. Keytruda  on hold as explained above. Pain regimen --- Scheduled: Oxycodone  5 mg every 8 hours. Reduced to BID today. --- PRN: IV Dilaudid , oxycodone , Tylenol     Recent DVT/PE,Splenic Infarct In October 2025, patient was diagnosed to have right leg DVT, PE as well as a splenic infarct and she was started on anticoagulation.  This  hospitalization, anticoagulation was held due to psoas hematoma and GI bleed.   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   Patient had BRBPR during this hospitalization BRBPR resolved at this time.  CT GI bleed negative.  Required PRBC transfusion during hospitalization.  Therapeutic Lovenox  changed to prophylaxis to Lovenox     Recurrent UTI, Klebsiella Completed 5 days of IV Rocephin  1/10   Dysphagia Moderate malnutrition -Placed PEG tube by IR on 1/6   Type 2 diabetes mellitus A1c 7.2.  On long-acting and sliding scale.  Adjust as necessary     Hypertension IV as needed     Depression/anxiety Prozac  discontinued due to rigidity -Continue Ativan  as needed   H/o CAD, Stroke HLD Currently on Lipitor  80 mg daily.  Anticoagulation on hold as discussed above   Abnormal Thyroid  Function Tests Initially low TSH and elevated free T4, was started on propranolol .  Repeat levels have normalized   Vaginal discharge Likely after recent antibiotic use received a dose of Diflucan  1/12   Hypomagnesemia As needed repletion    DVT prophylaxis: enoxaparin  (LOVENOX ) injection 40 mg Start: 09/10/24 1000 Place and maintain sequential compression device Start: 08/02/24 0738      Code Status: Limited: Do not attempt resuscitation (DNR) -DNR-LIMITED -Do Not Intubate/DNI  Family Communication:   Status is: Inpatient Remains inpatient appropriate because: SNF placement.    PT Follow up Recs: Skilled Nursing-Short Term Rehab (<3 Hours/Day)09/09/2024 1500  Subjective: Seen at bedside.  Daughter is also present at bedside. No meaningful interaction from the patient.  She randomly says words in between   Examination:  General exam: Appears calm and comfortable  Respiratory system: Clear to auscultation. Respiratory effort normal. Cardiovascular system: S1 & S2  heard, RRR. No JVD, murmurs, rubs, gallops or clicks. No pedal edema. Gastrointestinal system: Abdomen is nondistended, soft and nontender. No organomegaly or masses felt. Normal bowel sounds heard. Central nervous system: Difficult to assess Extremities: Symmetric 4 x 5 power. Skin: No rashes, lesions or ulcers Psychiatry: Judgement and insight appear poor PEG tube Right-sided chest wall Chemo-Port Foley catheter           Wound 07/25/24 1100 Pressure Injury Sacrum Medial Deep Tissue Pressure Injury - Purple or maroon localized area of discolored intact skin or blood-filled blister due to damage of underlying soft tissue from pressure and/or shear. (Active)     Wound 08/11/24 0415 Pressure Injury Vertebral column Medial Stage 2 -  Partial thickness loss of dermis presenting as a shallow open injury with a red, pink wound bed without slough. (Active)     Wound 08/31/24 2100 Pressure Injury Thigh Left;Posterior Stage 1 -  Intact skin with non-blanchable redness of a localized area usually over a bony prominence. (Active)     Diet Orders (From admission, onward)     Start     Ordered   09/09/24 0808  Diet regular Room service appropriate? Yes; Fluid consistency: Thin  Diet effective now       Question Answer Comment  Room service appropriate? Yes   Fluid consistency: Thin      09/09/24 0807            Objective: Vitals:   09/16/24 0014 09/16/24 0506 09/16/24 0753 09/16/24 1107  BP: (!) 171/68 (!) 159/70 136/62 (!) 154/78  Pulse: 84 84 85 85  Resp: 20 20 16 16   Temp: 99.3 F (37.4 C) 98.8 F (37.1 C) 98.6 F (37 C) 98.8 F (37.1 C)  TempSrc: Oral Axillary Oral Oral  SpO2: 99% 100% 99% 100%  Weight:      Height:        Intake/Output Summary (Last 24 hours) at 09/16/2024 1322 Last data filed at 09/16/2024 1152 Gross per 24 hour  Intake 750 ml  Output 1050 ml  Net -300 ml   Filed Weights   09/06/24 0429 09/07/24 0430 09/08/24 0500  Weight: 89.8 kg 87.1 kg  84.5 kg    Scheduled Meds:  atorvastatin   80 mg Per Tube QHS   busPIRone   5 mg Oral BID   Chlorhexidine  Gluconate Cloth  6 each Topical Daily   enoxaparin  (LOVENOX ) injection  40 mg Subcutaneous Q24H   feeding supplement (GLUCERNA 1.5 CAL)  237 mL Per Tube TID WC & HS   feeding supplement (PROSource TF20)  60 mL Per Tube Daily   fentaNYL   1 patch Transdermal Q72H   FLUoxetine   40 mg Per Tube Daily   free water   150 mL Per Tube 5 X Daily   gabapentin   100 mg Per Tube BID   insulin  aspart  0-9 Units Subcutaneous TID WC   insulin  glargine  15 Units Subcutaneous BID   levETIRAcetam   500 mg Per Tube BID   lidocaine   1 patch Transdermal Daily   nystatin   100,000 Units Topical TID   mouth rinse  15 mL Mouth Rinse 4 times per day   oxyCODONE   5 mg Per Tube BID   pantoprazole  (PROTONIX ) IV  40 mg Intravenous Q12H   polyethylene glycol  17 g Per Tube Daily  sodium chloride  flush  10-40 mL Intracatheter Q12H   Continuous Infusions:  Nutritional status Signs/Symptoms: percent weight loss, mild muscle depletion, moderate muscle depletion (12% x 6 months) Percent weight loss: 12 % Interventions: Prostat, Tube feeding, MVI Body mass index is 34.07 kg/m.  Data Reviewed:   CBC: Recent Labs  Lab 09/15/24 0533 09/16/24 0504  WBC 6.8 7.3  HGB 9.8* 10.8*  HCT 30.5* 32.7*  MCV 94.7 92.1  PLT 230 224   Basic Metabolic Panel: Recent Labs  Lab 09/15/24 0533  NA 139  K 4.1  CL 100  CO2 33*  GLUCOSE 78  BUN 14  CREATININE <0.30*  CALCIUM  9.1  MG 1.6*  PHOS 3.9   GFR: CrCl cannot be calculated (This lab value cannot be used to calculate CrCl because it is not a number: <0.30). Liver Function Tests: No results for input(s): AST, ALT, ALKPHOS, BILITOT, PROT, ALBUMIN  in the last 168 hours. No results for input(s): LIPASE, AMYLASE in the last 168 hours. No results for input(s): AMMONIA in the last 168 hours. Coagulation Profile: No results for input(s): INR,  PROTIME in the last 168 hours. Cardiac Enzymes: No results for input(s): CKTOTAL, CKMB, CKMBINDEX, TROPONINI in the last 168 hours. BNP (last 3 results) Recent Labs    05/22/24 0630  PROBNP 3,514.0*   HbA1C: No results for input(s): HGBA1C in the last 72 hours. CBG: Recent Labs  Lab 09/15/24 1130 09/15/24 1648 09/15/24 2211 09/16/24 0552 09/16/24 1105  GLUCAP 203* 251* 231* 280* 238*   Lipid Profile: No results for input(s): CHOL, HDL, LDLCALC, TRIG, CHOLHDL, LDLDIRECT in the last 72 hours. Thyroid  Function Tests: Recent Labs    09/16/24 0945  TSH 3.560  FREET4 1.31   Anemia Panel: Recent Labs    09/16/24 0945  VITAMINB12 961*   Sepsis Labs: No results for input(s): PROCALCITON, LATICACIDVEN in the last 168 hours.  Recent Results (from the past 240 hours)  Remove and replace urinary cath (placed > 5 days) then obtain urine culture from new indwelling urinary catheter.     Status: Abnormal   Collection Time: 09/07/24  4:09 PM   Specimen: Urine, Catheterized  Result Value Ref Range Status   Specimen Description URINE, CATHETERIZED  Final   Special Requests   Final    NONE Performed at [facility], [address], [place], [place] [address]    Culture 80,000 COLONIES/mL KLEBSIELLA PNEUMONIAE (A)  Final   Report Status 09/09/2024 FINAL  Final   Organism ID, Bacteria KLEBSIELLA PNEUMONIAE (A)  Final      Susceptibility   Klebsiella pneumoniae - MIC*    AMPICILLIN  >=32 RESISTANT Resistant     CEFAZOLIN  (URINE) Value in next row Sensitive      2 SENSITIVEThis is a modified FDA-approved test that has been validated and its performance characteristics determined by the reporting laboratory.  This laboratory is certified under the Clinical Laboratory Improvement Amendments CLIA as qualified to perform high complexity clinical laboratory testing.    CEFEPIME  Value in next row Sensitive      2 SENSITIVEThis is a modified FDA-approved  test that has been validated and its performance characteristics determined by the reporting laboratory.  This laboratory is certified under the Clinical Laboratory Improvement Amendments CLIA as qualified to perform high complexity clinical laboratory testing.    ERTAPENEM Value in next row Sensitive      2 SENSITIVEThis is a modified FDA-approved test that has been validated and its performance characteristics determined by  the reporting laboratory.  This laboratory is certified under the Clinical Laboratory Improvement Amendments CLIA as qualified to perform high complexity clinical laboratory testing.    CEFTRIAXONE  Value in next row Sensitive      2 SENSITIVEThis is a modified FDA-approved test that has been validated and its performance characteristics determined by the reporting laboratory.  This laboratory is certified under the Clinical Laboratory Improvement Amendments CLIA as qualified to perform high complexity clinical laboratory testing.    CIPROFLOXACIN  Value in next row Intermediate      2 SENSITIVEThis is a modified FDA-approved test that has been validated and its performance characteristics determined by the reporting laboratory.  This laboratory is certified under the Clinical Laboratory Improvement Amendments CLIA as qualified to perform high complexity clinical laboratory testing.    GENTAMICIN Value in next row Sensitive      2 SENSITIVEThis is a modified FDA-approved test that has been validated and its performance characteristics determined by the reporting laboratory.  This laboratory is certified under the Clinical Laboratory Improvement Amendments CLIA as qualified to perform high complexity clinical laboratory testing.    NITROFURANTOIN Value in next row Resistant      2 SENSITIVEThis is a modified FDA-approved test that has been validated and its performance characteristics determined by the reporting laboratory.  This laboratory is certified under the Clinical Laboratory  Improvement Amendments CLIA as qualified to perform high complexity clinical laboratory testing.    TRIMETH /SULFA  Value in next row Sensitive      2 SENSITIVEThis is a modified FDA-approved test that has been validated and its performance characteristics determined by the reporting laboratory.  This laboratory is certified under the Clinical Laboratory Improvement Amendments CLIA as qualified to perform high complexity clinical laboratory testing.    AMPICILLIN /SULBACTAM Value in next row Resistant      2 SENSITIVEThis is a modified FDA-approved test that has been validated and its performance characteristics determined by the reporting laboratory.  This laboratory is certified under the Clinical Laboratory Improvement Amendments CLIA as qualified to perform high complexity clinical laboratory testing.    PIP/TAZO Value in next row Sensitive      16 SENSITIVEThis is a modified FDA-approved test that has been validated and its performance characteristics determined by the reporting laboratory.  This laboratory is certified under the Clinical Laboratory Improvement Amendments CLIA as qualified to perform high complexity clinical laboratory testing.    MEROPENEM  Value in next row Sensitive      16 SENSITIVEThis is a modified FDA-approved test that has been validated and its performance characteristics determined by the reporting laboratory.  This laboratory is certified under the Clinical Laboratory Improvement Amendments CLIA as qualified to perform high complexity clinical laboratory testing.    * 80,000 COLONIES/mL KLEBSIELLA PNEUMONIAE         Radiology Studies: No results found.         LOS: 61 days   Time spent= 35 mins    Burgess JAYSON Dare, MD [facility]  If 7PM-7AM, please contact night-coverage  09/16/2024, 1:22 PM  "

## 2024-09-16 NOTE — NC FL2 (Addendum)
 " [place]  MEDICAID FL2 LEVEL OF CARE FORM     IDENTIFICATION  Patient Name: Brenda [name] Birthdate: 24-Sep-1960 Sex: female Admission Date (Current Location): 07/15/2024  [facility] and [place] Number:  Producer, Television/film/video and Address:  [facility] [place], [address], [place], [place] [address]      Provider Number: [id number]  Attending Physician Name and Address:  Caleen Burgess BROCKS, MD  Relative Name and Phone Number:       Current Level of Care: Hospital Recommended Level of Care: Skilled Nursing Facility Prior Approval Number:    Date Approved/Denied:   PASRR Number: [id number]  Discharge Plan: SNF    Current Diagnoses: Patient Active Problem List   Diagnosis Date Noted   Rigidity 08/10/2024   Malnutrition of moderate degree 07/24/2024   Seizures (HCC) 07/22/2024   Hypernatremia 07/22/2024   UTI (urinary tract infection) 07/16/2024   Leukopenia 07/16/2024   History of stroke 07/13/2024   Acute UTI 07/13/2024   History of thromboembolism 07/13/2024   DNR (do not resuscitate) 07/13/2024   Acute metabolic encephalopathy 06/19/2024   Illness 06/18/2024   Encounter for medication review and counseling 06/18/2024   Adjustment disorder with anxiety 06/08/2024   CVA (cerebral vascular accident) (HCC) 06/03/2024   Acute ischemic stroke (HCC) 05/31/2024   Brain lesion 05/31/2024   DKA (diabetic ketoacidosis) (HCC) 05/21/2024   Physical debility 05/12/2024   Metastatic malignant neoplasm (HCC) 05/05/2024   Community acquired pneumonia 05/04/2024   History of CAD (coronary artery disease) 05/04/2024   CAP (community acquired pneumonia) 05/04/2024   Neoplasm of vertebral column 04/27/2024   Stroke-like symptoms 04/21/2024   Acute focal neurological deficit 04/20/2024   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 04/16/2024   Goals of care, counseling/discussion 04/16/2024   Other constipation 04/16/2024   Loss of appetite 04/06/2024    Intractable low back pain 03/29/2024   Pain of left thigh 03/03/2024   Rectal hemorrhage 03/03/2024   Contact dermatitis due to plant 01/30/2024   Moderate recurrent major depression (HCC) 12/24/2023   Counseling for HPV (human papillomavirus) vaccination 11/28/2023   Diabetic retinopathy associated with type 1 diabetes mellitus (HCC) 11/11/2023   Hyperglycemia due to type 2 diabetes mellitus (HCC) 11/11/2023   Cobalamin deficiency 10/28/2023   Long term current use of oral hypoglycemic drug 09/30/2023   Polyneuropathy due to type 2 diabetes mellitus (HCC) 09/30/2023   Recurrent falls 09/30/2023   Oxygen  dependent 09/17/2023   Glycosuria 07/29/2023   Health maintenance alteration 07/25/2023   Vitamin D deficiency 07/25/2023   Iron  deficiency 07/22/2023   Long-term current use of proton pump inhibitor therapy 07/22/2023   Hypokalemia 04/26/2023   Hypomagnesemia 04/26/2023   Neuropathy 04/19/2023   Follow-up examination following treatment with high-risk medication 04/11/2023   Acute COVID-19 04/10/2023   Candidiasis of skin 02/15/2023   Thyroid  nodule 12/14/2022   Impacted cerumen of right ear 11/16/2022   Need for prophylactic vaccination with combined diphtheria-tetanus-pertussis (DTP) vaccine 11/16/2022   Health education given 10/30/2022   Physical deconditioning 10/15/2022   Diabetic neuropathy (HCC) 08/17/2022   Dysuria 08/17/2022   Ingrown toenail of right foot 08/17/2022   Generalized ischemic myocardial dysfunction 06/07/2022   Polycystic ovary syndrome 05/17/2022   Status post coronary artery bypass graft 05/16/2022   Abnormal urinalysis 03/02/2022   History of malignant neoplasm of uterine body 02/09/2022   Small bowel obstruction (HCC) 12/09/2021   SBO (small bowel obstruction) (HCC) 12/08/2021   Endometrial carcinoma (HCC) 12/06/2021  Non-cardiac chest pain 10/19/2021   Dysfunctional uterine bleeding    Anemia    Chronic hypoxic respiratory failure (HCC)  03/22/2021   (HFpEF) heart failure with preserved ejection fraction (HCC) 02/09/2021   Chronic diastolic CHF (congestive heart failure) (HCC) 02/08/2021   Old MI (myocardial infarction) 10/26/2019   Anxiety with depression 03/28/2016   Type 2 diabetes mellitus with complication, with long-term current use of insulin  (HCC) 02/23/2015   Class 1 obesity due to excess calories with body mass index (BMI) of 32.0 to 32.9 in adult 02/22/2015   Dyspnea 03/11/2013   Hyperlipidemia 05/25/2009   CAD (coronary artery disease) 05/25/2009   Essential hypertension 05/24/2009   Obstructive sleep apnea 05/24/2009    Orientation RESPIRATION BLADDER Height & Weight     Self   O2 ( 2L) Indwelling catheter Weight: 186 lb 4.6 oz (84.5 kg) Height:  5' 2 (157.5 cm)  BEHAVIORAL SYMPTOMS/MOOD NEUROLOGICAL BOWEL NUTRITION STATUS    Convulsions/Seizures Incontinent Feeding tube, Diet (regular diet; peg tube: Glucerna 1.5 237 mL 3x/day with meals and at bedtime)  AMBULATORY STATUS COMMUNICATION OF NEEDS Skin   Total Care Verbally PU Stage and Appropriate Care, Other (Comment) (deep tissue injury: sacrum, foam dressing, lift every shift to assess and change PRN; moisture associated skin damage, axilla lateral, left: foam dressing, lift every shift to assess and change PRN) PU Stage 1 Dressing:  (left thigh: foam dressing, lift every shift to assess and change PRN) PU Stage 2 Dressing:  (vertebral column: foam dressing, lift every shift to assess and change PRN)                   Personal Care Assistance Level of Assistance  Bathing, Feeding, Dressing Bathing Assistance: Maximum assistance Feeding assistance: Maximum assistance Dressing Assistance: Maximum assistance     Functional Limitations Info             SPECIAL CARE FACTORS FREQUENCY  PT (By licensed PT), OT (By licensed OT)     PT Frequency: 5x/wk OT Frequency: 5x/wk            Contractures Contractures Info: Not present     Additional Factors Info  Code Status, Allergies, Psychotropic, Insulin  Sliding Scale Code Status Info: DNR Allergies Info: Hydrocodone, Clopidogrel Psychotropic Info: Buspar  5mg  2x/day; Prozac  40mg  daily Insulin  Sliding Scale Info: see DC summary       Current Medications (09/16/2024):  This is the current hospital active medication list Current Facility-Administered Medications  Medication Dose Route Frequency Provider Last Rate Last Admin   acetaminophen  (TYLENOL ) tablet 650 mg  650 mg Oral Q6H PRN Amin, Ankit C, MD       artificial tears (LACRILUBE) ophthalmic ointment   Both Eyes Q4H PRN Cosette Blackwater, MD   Given at 09/01/24 1211   atorvastatin  (LIPITOR ) tablet 80 mg  80 mg Per Tube QHS Sigdel, Santosh, MD   80 mg at 09/16/24 0023   busPIRone  (BUSPAR ) tablet 5 mg  5 mg Oral BID Lama, Gagan S, MD   5 mg at 09/16/24 1041   Chlorhexidine  Gluconate Cloth 2 % PADS 6 each  6 each Topical Daily Chavez, Abigail, NP   6 each at 09/15/24 1031   enoxaparin  (LOVENOX ) injection 40 mg  40 mg Subcutaneous Q24H Drusilla, Gagan S, MD   40 mg at 09/16/24 1005   feeding supplement (GLUCERNA 1.5 CAL) liquid 237 mL  237 mL Per Tube TID WC & HS Von Bellis, MD   237 mL at 09/16/24 1144  feeding supplement (PROSource TF20) liquid 60 mL  60 mL Per Tube Daily Von Bellis, MD   60 mL at 09/16/24 1041   fentaNYL  (DURAGESIC ) 25 MCG/HR 1 patch  1 patch Transdermal Q72H Drusilla Sabas RAMAN, MD   1 patch at 09/15/24 2058   FLUoxetine  (PROZAC ) capsule 40 mg  40 mg Per Tube Daily Ennever, Peter R, MD   40 mg at 09/16/24 1041   free water  150 mL  150 mL Per Tube 5 X Daily Von Bellis, MD   150 mL at 09/16/24 1152   gabapentin  (NEURONTIN ) 250 MG/5ML solution 100 mg  100 mg Per Tube BID Cooper, Josseline P, PA-C   100 mg at 09/16/24 1041   glucagon  (human recombinant) (GLUCAGEN) injection 1 mg  1 mg Intravenous PRN Amin, Ankit C, MD       guaiFENesin  (ROBITUSSIN) 100 MG/5ML liquid 5 mL  5 mL Oral Q4H PRN Amin, Ankit C, MD        hydrALAZINE  (APRESOLINE ) injection 10 mg  10 mg Intravenous Q4H PRN Amin, Ankit C, MD       insulin  aspart (novoLOG ) injection 0-9 Units  0-9 Units Subcutaneous TID WC Von Bellis, MD   3 Units at 09/16/24 1143   insulin  glargine (LANTUS ) injection 15 Units  15 Units Subcutaneous BID Von Bellis, MD   15 Units at 09/16/24 1005   ipratropium-albuterol  (DUONEB) 0.5-2.5 (3) MG/3ML nebulizer solution 3 mL  3 mL Nebulization Q4H PRN Amin, Ankit C, MD       levETIRAcetam  (KEPPRA ) 100 MG/ML solution 500 mg  500 mg Per Tube BID Von Bellis, MD   500 mg at 09/16/24 1102   lidocaine  (LIDODERM ) 5 % 1 patch  1 patch Transdermal Daily Vernon Ranks, MD   1 patch at 09/15/24 1616   LORazepam  (ATIVAN ) tablet 0.5 mg  0.5 mg Per Tube Q6H PRN Amin, Ankit C, MD       methylphenidate  (RITALIN ) tablet 5 mg  5 mg Per Tube BID PRN Von Bellis, MD       metoprolol  tartrate (LOPRESSOR ) injection 5 mg  5 mg Intravenous Q4H PRN Amin, Ankit C, MD       naloxone  (NARCAN ) injection 0.4 mg  0.4 mg Intravenous PRN Rathore, Vasundhra, MD       nystatin  (MYCOSTATIN /NYSTOP ) topical powder 1 Application  100,000 Units Topical TID Vernon Ranks, MD   1 Application at 09/16/24 1102   ondansetron  (ZOFRAN ) injection 4 mg  4 mg Intravenous Q6H PRN Amin, Ankit C, MD       ondansetron  (ZOFRAN ) tablet 4 mg  4 mg Per Tube Q6H PRN Cosette Blackwater, MD       Oral care mouth rinse  15 mL Mouth Rinse 4 times per day Perri DELENA Meliton Mickey., MD   15 mL at 09/16/24 1152   Oral care mouth rinse  15 mL Mouth Rinse PRN Perri DELENA Meliton Mickey., MD   15 mL at 08/21/24 1145   oxyCODONE  (Oxy IR/ROXICODONE ) immediate release tablet 2.5 mg  2.5 mg Per Tube Q4H PRN Laron Agent, RPH       Or   oxyCODONE  (Oxy IR/ROXICODONE ) immediate release tablet 5 mg  5 mg Per Tube Q4H PRN Laron Agent, RPH   5 mg at 09/14/24 2047   oxyCODONE  (Oxy IR/ROXICODONE ) immediate release tablet 5 mg  5 mg Per Tube BID Laron Agent, RPH   5 mg at 09/16/24 1041    pantoprazole  (PROTONIX ) injection 40 mg  40 mg Intravenous  Q12H Perri DELENA Meliton Mickey., MD   40 mg at 09/16/24 1007   polyethylene glycol (MIRALAX  / GLYCOLAX ) packet 17 g  17 g Per Tube Daily Laron Agent, RPH   17 g at 09/15/24 1031   prochlorperazine  (COMPAZINE ) injection 10 mg  10 mg Intravenous Q6H PRN Howerter, Justin B, DO   10 mg at 09/14/24 2035   senna-docusate (Senokot-S) tablet 1 tablet  1 tablet Oral QHS PRN Amin, Ankit C, MD       sodium chloride  flush (NS) 0.9 % injection 10-40 mL  10-40 mL Intracatheter Q12H Sigdel, Santosh, MD   10 mL at 09/16/24 0900   sodium chloride  flush (NS) 0.9 % injection 10-40 mL  10-40 mL Intracatheter PRN Sigdel, Santosh, MD       traZODone  (DESYREL ) tablet 25 mg  25 mg Per Tube QHS PRN Laron Agent, RPH   25 mg at 09/14/24 2046     Discharge Medications: Please see discharge summary for a list of discharge medications.  Relevant Imaging Results:  Relevant Lab Results:   Additional Information SS#: [id number]  [name], LCSW     "

## 2024-09-16 NOTE — Plan of Care (Signed)
" °  Problem: Nutritional: Goal: Maintenance of adequate nutrition will improve Outcome: Progressing   Problem: Skin Integrity: Goal: Risk for impaired skin integrity will decrease Outcome: Progressing   Problem: Tissue Perfusion: Goal: Adequacy of tissue perfusion will improve Outcome: Progressing   Problem: Clinical Measurements: Goal: Respiratory complications will improve Outcome: Progressing Goal: Cardiovascular complication will be avoided Outcome: Progressing   Problem: Elimination: Goal: Will not experience complications related to bowel motility Outcome: Progressing   Problem: Skin Integrity: Goal: Risk for impaired skin integrity will decrease Outcome: Progressing   Problem: Nutrition: Goal: Adequate nutrition will be maintained Outcome: Progressing   "

## 2024-09-16 NOTE — Progress Notes (Signed)
 " Daily Progress Note   Date: 09/16/2024   Patient Name: Brenda [name]  DOB: [birth year]  MRN: [id number]  Age / Sex: 64 y.o., female  Attending Physician: Caleen Burgess BROCKS, MD Primary Care Physician: [facility] Admit Date: 07/15/2024 Length of Stay: 61 days  Reason for Follow-up: Establishing goals of care  Past Medical History:  Diagnosis Date   Anemia    Arthritis    back, hands, hips (02/22/2015)   CAD (coronary artery disease)    a. complex LAD/diagonal bifurcation PCI in 2010. b. STEMI 10/2019 s/p PTCA/DES x1 to mLAD overlapping the old stent, residual disease treated medicaly.   Cancer [facility])    uterine   CHF (congestive heart failure) (HCC)    Depression    Diabetes mellitus (HCC)    started when I was pregnant; not sure if it was type 1 or type 2    History of radiation therapy    Endometrium- HDR 01/22/22-03/07/22- Dr. Lynwood Nasuti   Hypercholesterolemia    Hypertension    Morbid obesity (HCC)    Myocardial infarction (HCC)    mild x 3   Neuromuscular disorder (HCC)    neuropathy feet   Sleep apnea    cpap/    Assessment & Plan:   HPI/Patient Profile:   63 y.o. female with past medical history of metastatic endometrial cancer s/p hysterectomy and adjuvant radiation (2023), HTN, chronic cancer related pain, CVA (05/2024) admitted on 07/15/2024 with cefepime  neurotoxicity.   Patient's mentation and rigidity has improved slightly s/p plasma exchange x5 that was completed 08/29/2024. Patient now s/p PEG tube on 09/08/2024. Patient remains a poor candidate for systemic chemotherapy at this time per oncology note. PT services signed off on 09/10/2024 again due to patient's inability to make progress towards goals. Overall progress towards continued aggressive cancer treatment and recovery in general towards rehab is stagnant.    Palliative medicine consulted for goals of care conversation.  SUMMARY OF RECOMMENDATIONS Continue current medical  management Goals of care remain clear with desire for life prolonging measures Palliative medicine will continue to follow Will discuss options with patient's family about SNF/LTAC/Home care  Symptom Management:  Per primary team  Code Status: DNR - Limited (DNR/DNI)  Prognosis: Unable to determine  Discharge Planning: To Be Determined  Discussed with: Amin MD 09/16/2024 about patient's overall poor progress and consideration of LTAC at this point due to poor ability to participate in rehab and lack of progression towards rehab goals.   Subjective:   Subjective: Chart Reviewed. Updates received. Patient Assessed. Created space and opportunity for patient  and family to explore thoughts and feelings regarding current medical situation.  Today's Discussion:  Met with the patient at bedside with husband present. Patient was somnolent after receiving Ativan  and Dilaudid . Discussed with husband briefly what the medical team has shared with him and he discussed skilled nursing facility for short term rehab. Husband is aware that with the patient's PEG tube now care at home will be difficult - he and his family are willing to care for her at home but knows that the logistics of having enough support to care for her 24/7 without supplemental help will be difficult. He inquired about extra care at home and shared with him that most of the time private caregivers will have to be paid for by family and it would not be covered by insurance and he is aware that it is very expensive and shares that they would not  be able to afford it. He also shares concern for the quality of care she would receive at rehab facilities and frankly shared with him that his concerns are valid as most of the care at those facilities are usually not great. Shared that he has heard that [facility] and a facility his aunt stays in [place] are known for better care but knows that the patient is not a candidate for [employer] due  to religious affiliation. Shared with him I will look up more information for him to consider home vs rehab facility.   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 She is ill-appearing.     Comments: Somnolent, did not attempt to wake due to recently receiving medication for pain and anxiety.   HENT:     Head: Normocephalic and atraumatic.     Nose: Nose normal.  Cardiovascular:     Rate and Rhythm: Normal rate.  Pulmonary:     Effort: Pulmonary effort is normal.    Palliative Assessment/Data: 30% (PEG tube feeds)   Existing Vynca/ACP Documentation: None  Thank you for allowing us  to participate in the care of Brenda [name] PMT will continue to support holistically.  I personally spent a total of 35 minutes in the care of the patient today including preparing to see the patient, getting/reviewing separately obtained history, performing a medically appropriate exam/evaluation, counseling and educating, referring and communicating with other health care professionals, and documenting clinical information in the EHR.   Henritta Mutz E Sachit Gilman, PA-C  Palliative Medicine Team  Team Phone # [phone] (Nights/Weekends) 09/16/2024 4:14 PM  "

## 2024-09-16 NOTE — Inpatient Diabetes Management (Signed)
 Inpatient Diabetes Program Recommendations  AACE/ADA: New Consensus Statement on Inpatient Glycemic Control   Target Ranges:  Prepandial:   less than 140 mg/dL      Peak postprandial:   less than 180 mg/dL (1-2 hours)      Critically ill patients:  140 - 180 mg/dL    Latest Reference Range & Units 09/15/24 03:57 09/15/24 07:44 09/15/24 08:56 09/15/24 11:30 09/15/24 16:48 09/15/24 22:11 09/16/24 05:52  Glucose-Capillary 70 - 99 mg/dL 878 (H) 57 (L) 877 (H) 203 (H) 251 (H) 231 (H) 280 (H)   Review of Glycemic Control  Diabetes history: DM2 Outpatient Diabetes medications: Tresiba  25 units daily, Fiasp  1-3 units TID with meals, Metformin  1000 mg BID Current orders for Inpatient glycemic control: Lantus  15 units BID, Novolog  0-9 units TID with meals; Glucerna 237 ml AC&HS  Inpatient Diabetes Program Recommendations:    Insulin : Please consider adding Novolog  0-5 units at bedtime and Novolog  4 units AC&HS (for tube feeding bolus).  Thanks, Earnie Gainer, RN, MSN, CDCES Diabetes Coordinator Inpatient Diabetes Program [phone] (Team Pager from 8am to 5pm)

## 2024-09-16 NOTE — TOC Progression Note (Signed)
 Transition of Care [facility]) - Progression Note    Patient Details  Name: Brenda [name] MRN: [id number] Date of Birth: May 18, 1961  [facility]) CM/SW Contact  [name], [place] Phone Number: 09/16/2024, 1:51 PM  Clinical Narrative:   CSW acknowledging consult for SNF placement. CSW discussed with MD medical barriers for placement, including IV medications. CSW completed referral and faxed out, awaiting responses. Noting that both PT and OT have both signed off due to lack of progress and lack of command following in sessions, anticipate patient may need LTC instead of rehab. CSW to follow.    Expected Discharge Plan: Skilled Nursing Facility Barriers to Discharge: Continued Medical Work up, English As A Second Language Teacher, Inadequate or no insurance, SNF Pending bed offer               Expected Discharge Plan and Services                                               Social Drivers of Health (SDOH) Interventions SDOH Screenings   Food Insecurity: No Food Insecurity (07/16/2024)  Recent Concern: Food Insecurity - Food Insecurity Present (04/20/2024)  Housing: Low Risk (07/16/2024)  Transportation Needs: No Transportation Needs (07/16/2024)  Utilities: Not At Risk (07/16/2024)  Depression (PHQ2-9): Low Risk (06/30/2024)  Recent Concern: Depression (PHQ2-9) - Medium Risk (05/07/2024)  Social Connections: Moderately Integrated (07/13/2024)  Tobacco Use: Medium Risk (09/07/2024)    Readmission Risk Interventions    07/14/2024    2:34 PM 05/04/2024   10:00 AM  Readmission Risk Prevention Plan  Transportation Screening Complete Complete  PCP or Specialist Appt within 3-5 Days  Complete  HRI or Home Care Consult  Complete  Social Work Consult for Recovery Care Planning/Counseling  Complete  Palliative Care Screening  Not Applicable  Medication Review Oceanographer) Complete Complete  PCP or Specialist appointment within 3-5 days of discharge  Complete   HRI or Home Care Consult Complete   SW Recovery Care/Counseling Consult Complete   Palliative Care Screening Not Applicable   Skilled Nursing Facility Not Applicable

## 2024-09-17 ENCOUNTER — Inpatient Hospital Stay (HOSPITAL_COMMUNITY)

## 2024-09-17 ENCOUNTER — Ambulatory Visit: Admitting: Radiation Oncology

## 2024-09-17 DIAGNOSIS — C541 Malignant neoplasm of endometrium: Secondary | ICD-10-CM | POA: Diagnosis not present

## 2024-09-17 DIAGNOSIS — R569 Unspecified convulsions: Secondary | ICD-10-CM | POA: Diagnosis not present

## 2024-09-17 DIAGNOSIS — G988 Other disorders of nervous system: Secondary | ICD-10-CM

## 2024-09-17 DIAGNOSIS — G9341 Metabolic encephalopathy: Secondary | ICD-10-CM | POA: Diagnosis not present

## 2024-09-17 DIAGNOSIS — R4182 Altered mental status, unspecified: Secondary | ICD-10-CM | POA: Diagnosis not present

## 2024-09-17 LAB — GLUCOSE, CAPILLARY
Glucose-Capillary: 175 mg/dL — ABNORMAL HIGH (ref 70–99)
Glucose-Capillary: 246 mg/dL — ABNORMAL HIGH (ref 70–99)
Glucose-Capillary: 330 mg/dL — ABNORMAL HIGH (ref 70–99)
Glucose-Capillary: 277 mg/dL — ABNORMAL HIGH (ref 70–99)

## 2024-09-17 LAB — BASIC METABOLIC PANEL WITH GFR
Anion gap: 5 (ref 5–15)
BUN: 13 mg/dL (ref 8–23)
CO2: 32 mmol/L (ref 22–32)
Calcium: 9.1 mg/dL (ref 8.9–10.3)
Chloride: 99 mmol/L (ref 98–111)
Creatinine, Ser: 0.33 mg/dL — ABNORMAL LOW (ref 0.44–1.00)
GFR, Estimated: >60 mL/min
Glucose, Bld: 204 mg/dL — ABNORMAL HIGH (ref 70–99)
Potassium: 4.1 mmol/L (ref 3.5–5.1)
Sodium: 136 mmol/L (ref 135–145)

## 2024-09-17 LAB — MAGNESIUM: Magnesium: 1.8 mg/dL (ref 1.7–2.4)

## 2024-09-17 LAB — PHOSPHORUS: Phosphorus: 3.5 mg/dL (ref 2.5–4.6)

## 2024-09-17 MED ORDER — FREE WATER
100.0000 mL | Freq: Every day | Status: AC
  Administered 2024-09-17 – 2024-10-09 (×112): 100 mL

## 2024-09-17 MED ORDER — FAMOTIDINE 20 MG PO TABS
20.0000 mg | ORAL_TABLET | Freq: Two times a day (BID) | ORAL | Status: AC
  Administered 2024-09-17 – 2024-10-09 (×45): 20 mg
  Filled 2024-09-17 (×45): qty 1

## 2024-09-17 MED ORDER — LORAZEPAM 2 MG/ML IJ SOLN
1.0000 mg | Freq: Once | INTRAMUSCULAR | Status: AC
  Administered 2024-09-17: 1 mg via INTRAVENOUS
  Filled 2024-09-17: qty 1

## 2024-09-17 MED ORDER — INSULIN ASPART 100 UNIT/ML IJ SOLN
0.0000 [IU] | Freq: Three times a day (TID) | INTRAMUSCULAR | Status: DC
  Administered 2024-09-17: 3 [IU] via SUBCUTANEOUS
  Administered 2024-09-17: 7 [IU] via SUBCUTANEOUS
  Administered 2024-09-18: 2 [IU] via SUBCUTANEOUS
  Administered 2024-09-18: 7 [IU] via SUBCUTANEOUS
  Administered 2024-09-19: 9 [IU] via SUBCUTANEOUS
  Administered 2024-09-19: 3 [IU] via SUBCUTANEOUS
  Administered 2024-09-19: 2 [IU] via SUBCUTANEOUS
  Administered 2024-09-20: 5 [IU] via SUBCUTANEOUS
  Administered 2024-09-20 (×2): 9 [IU] via SUBCUTANEOUS
  Administered 2024-09-21: 2 [IU] via SUBCUTANEOUS
  Administered 2024-09-21: 7 [IU] via SUBCUTANEOUS
  Administered 2024-09-21: 5 [IU] via SUBCUTANEOUS
  Administered 2024-09-22 (×2): 2 [IU] via SUBCUTANEOUS
  Administered 2024-09-23: 5 [IU] via SUBCUTANEOUS
  Administered 2024-09-23 (×2): 7 [IU] via SUBCUTANEOUS
  Administered 2024-09-24: 2 [IU] via SUBCUTANEOUS
  Administered 2024-09-24: 1 [IU] via SUBCUTANEOUS
  Administered 2024-09-25: 3 [IU] via SUBCUTANEOUS
  Administered 2024-09-25 – 2024-09-26 (×2): 2 [IU] via SUBCUTANEOUS
  Administered 2024-09-26: 9 [IU] via SUBCUTANEOUS
  Administered 2024-09-26: 7 [IU] via SUBCUTANEOUS
  Filled 2024-09-17: qty 3
  Filled 2024-09-17 (×4): qty 1
  Filled 2024-09-17: qty 3
  Filled 2024-09-17: qty 2
  Filled 2024-09-17: qty 1
  Filled 2024-09-17: qty 2
  Filled 2024-09-17 (×2): qty 1
  Filled 2024-09-17: qty 7
  Filled 2024-09-17: qty 1
  Filled 2024-09-17: qty 7
  Filled 2024-09-17 (×4): qty 1
  Filled 2024-09-17: qty 4
  Filled 2024-09-17 (×2): qty 1

## 2024-09-17 MED ORDER — ALUM & MAG HYDROXIDE-SIMETH 200-200-20 MG/5ML PO SUSP
30.0000 mL | Freq: Four times a day (QID) | ORAL | Status: AC | PRN
  Administered 2024-09-17: 30 mL
  Filled 2024-09-17: qty 30

## 2024-09-17 MED ORDER — INSULIN ASPART 100 UNIT/ML IJ SOLN
4.0000 [IU] | Freq: Three times a day (TID) | INTRAMUSCULAR | Status: DC
  Administered 2024-09-17 – 2024-09-21 (×8): 4 [IU] via SUBCUTANEOUS
  Filled 2024-09-17 (×2): qty 4
  Filled 2024-09-17 (×3): qty 1
  Filled 2024-09-17: qty 4
  Filled 2024-09-17 (×2): qty 1

## 2024-09-17 MED ORDER — INSULIN ASPART 100 UNIT/ML IJ SOLN
0.0000 [IU] | Freq: Every day | INTRAMUSCULAR | Status: DC
  Administered 2024-09-17: 3 [IU] via SUBCUTANEOUS
  Administered 2024-09-18 – 2024-09-19 (×2): 5 [IU] via SUBCUTANEOUS
  Administered 2024-09-20: 4 [IU] via SUBCUTANEOUS
  Administered 2024-09-21: 3 [IU] via SUBCUTANEOUS
  Administered 2024-09-22: 5 [IU] via SUBCUTANEOUS
  Administered 2024-09-23: 4 [IU] via SUBCUTANEOUS
  Administered 2024-09-25: 3 [IU] via SUBCUTANEOUS
  Administered 2024-09-26: 4 [IU] via SUBCUTANEOUS
  Administered 2024-09-27: 2 [IU] via SUBCUTANEOUS
  Filled 2024-09-17: qty 1
  Filled 2024-09-17: qty 4
  Filled 2024-09-17: qty 5
  Filled 2024-09-17 (×5): qty 1
  Filled 2024-09-17: qty 4
  Filled 2024-09-17: qty 3

## 2024-09-17 NOTE — TOC Progression Note (Signed)
 Transition of Care [facility]) - Progression Note    Patient Details  Name: Brenda [name] MRN: [id number] Date of Birth: May 13, 1961  Transition of Care [facility]) CM/SW Contact  [name], [place] Phone Number: 09/17/2024, 3:37 PM  Clinical Narrative:   CSW met with patient's daughter, [name], at bedside earlier today to discuss SNF placement. [name] reported that the patient's husband was making the decisions and requested that CSW speak with him.   CSW called and spoke with patient's husband, [name]. Lengthy discussion with [name], as he expressed frustration about the patient's condition and hospital course. CSW answered questions as able. Matthew specifically concerned with questions related to the hospital transfer, asking for additional information on that; CSW relayed information to MD and asked to discuss with the spouse.  CSW discussed disposition with [name] and answered questions related to differences between SNF placement and going home, including what insurance would and would not cover. [name] is interested in looking at what SNF options would be available, but also has concerns about the care that patient would receive so family is also considering whether it would be feasible to bring her home instead. At this time, patient has no bed offers, and CSW explained to [name] the barriers related to the patient's insurance and finding a SNF. Patient does have disability in place, so patient could qualify for LTC Medicaid, if needed. [name] would like to be notified of any bed offers so that facility can be reviewed. CSW to continue to follow.    Expected Discharge Plan: Skilled Nursing Facility Barriers to Discharge: Continued Medical Work up, English As A Second Language Teacher, Inadequate or no insurance, SNF Pending bed offer               Expected Discharge Plan and Services                                               Social Drivers of Health (SDOH)  Interventions SDOH Screenings   Food Insecurity: No Food Insecurity (07/16/2024)  Recent Concern: Food Insecurity - Food Insecurity Present (04/20/2024)  Housing: Low Risk (07/16/2024)  Transportation Needs: No Transportation Needs (07/16/2024)  Utilities: Not At Risk (07/16/2024)  Depression (PHQ2-9): Low Risk (06/30/2024)  Recent Concern: Depression (PHQ2-9) - Medium Risk (05/07/2024)  Social Connections: Moderately Integrated (07/13/2024)  Tobacco Use: Medium Risk (09/07/2024)    Readmission Risk Interventions    07/14/2024    2:34 PM 05/04/2024   10:00 AM  Readmission Risk Prevention Plan  Transportation Screening Complete Complete  PCP or Specialist Appt within 3-5 Days  Complete  HRI or Home Care Consult  Complete  Social Work Consult for Recovery Care Planning/Counseling  Complete  Palliative Care Screening  Not Applicable  Medication Review Oceanographer) Complete Complete  PCP or Specialist appointment within 3-5 days of discharge Complete   HRI or Home Care Consult Complete   SW Recovery Care/Counseling Consult Complete   Palliative Care Screening Not Applicable   Skilled Nursing Facility Not Applicable

## 2024-09-17 NOTE — Progress Notes (Signed)
 EEG complete - results pending

## 2024-09-17 NOTE — Progress Notes (Signed)
 NEUROLOGY CONSULT FOLLOW UP NOTE   Date of service: September 17, 2024 Patient Name: Brenda [name] MRN:  [id number] DOB:  1961-01-06  Interval Hx/subjective   She is having increased confusion and stiffness.  She tells me that she is not in pain, though her husband states that she has been having more pain lately.  Vitals   Vitals:   09/17/24 1057 09/17/24 1136 09/17/24 1530 09/17/24 2133  BP: (!) 156/73 (!) 160/90 (!) 157/78 136/66  Pulse: 86  98 80  Resp: 20 18 20 18   Temp:  98 F (36.7 C) 99.8 F (37.7 C) 99.3 F (37.4 C)  TempSrc:  Axillary Oral Oral  SpO2:  99% 100% 100%  Weight:      Height:         Physical Exam    Neuro: Mental Status: She appears to be responding to unseen stimuli, is able to answer questions. Cranial Nerves: Pupils are unequal, left is larger and less reactive, documented repeatedly in previous notes as chronic.  She has difficulty going fully to either direction, but she is able to cross midline in both directions with extraocular movements, face is symmetric. Motor: She has increased tone Sensation: She endorses symmetric sensation though I am not sure the reliability.    Medications  Current Facility-Administered Medications:    acetaminophen  (TYLENOL ) tablet 650 mg, 650 mg, Oral, Q6H PRN, Amin, Ankit C, MD, 650 mg at 09/17/24 2031   alum & mag hydroxide-simeth (MAALOX/MYLANTA) 200-200-20 MG/5ML suspension 30 mL, 30 mL, Per Tube, Q6H PRN, Amin, Ankit C, MD, 30 mL at 09/17/24 1104   artificial tears (LACRILUBE) ophthalmic ointment, , Both Eyes, Q4H PRN, Tariq, Hassan, MD, Given at 09/01/24 1211   atorvastatin  (LIPITOR ) tablet 80 mg, 80 mg, Per Tube, QHS, Sigdel, Santosh, MD, 80 mg at 09/16/24 2258   busPIRone  (BUSPAR ) tablet 5 mg, 5 mg, Oral, BID, Drusilla, Sabas RAMAN, MD, 5 mg at 09/17/24 9093   Chlorhexidine  Gluconate Cloth 2 % PADS 6 each, 6 each, Topical, Daily, Chavez, Abigail, NP, 6 each at 09/17/24 0954   enoxaparin  (LOVENOX )  injection 40 mg, 40 mg, Subcutaneous, Q24H, Drusilla, Sabas RAMAN, MD, 40 mg at 09/17/24 0916   famotidine  (PEPCID ) tablet 20 mg, 20 mg, Per Tube, BID, Amin, Ankit C, MD   feeding supplement (GLUCERNA 1.5 CAL) liquid 237 mL, 237 mL, Per Tube, TID WC & HS, Von Bellis, MD, 237 mL at 09/17/24 1704   feeding supplement (PROSource TF20) liquid 60 mL, 60 mL, Per Tube, Daily, Von Bellis, MD, 60 mL at 09/17/24 0906   fentaNYL  (DURAGESIC ) 25 MCG/HR 1 patch, 1 patch, Transdermal, Q72H, Drusilla Sabas RAMAN, MD, 1 patch at 09/15/24 2058   FLUoxetine  (PROZAC ) capsule 40 mg, 40 mg, Per Tube, Daily, Ennever, Peter R, MD, 40 mg at 09/17/24 0907   free water  100 mL, 100 mL, Per Tube, 5 X Daily, Amin, Ankit C, MD, 100 mL at 09/17/24 1703   gabapentin  (NEURONTIN ) 250 MG/5ML solution 100 mg, 100 mg, Per Tube, BID, Cooper, Josseline P, PA-C, 100 mg at 09/17/24 9093   glucagon  (human recombinant) (GLUCAGEN ) injection 1 mg, 1 mg, Intravenous, PRN, Amin, Ankit C, MD   guaiFENesin  (ROBITUSSIN) 100 MG/5ML liquid 5 mL, 5 mL, Oral, Q4H PRN, Amin, Ankit C, MD   hydrALAZINE  (APRESOLINE ) injection 10 mg, 10 mg, Intravenous, Q4H PRN, Amin, Ankit C, MD   insulin  aspart (novoLOG ) injection 0-5 Units, 0-5 Units, Subcutaneous, QHS, Amin, Ankit C, MD   insulin  aspart (novoLOG )  injection 0-9 Units, 0-9 Units, Subcutaneous, TID WC, Amin, Ankit C, MD, 7 Units at 09/17/24 1608   insulin  aspart (novoLOG ) injection 4 Units, 4 Units, Subcutaneous, TID WC, Amin, Ankit C, MD, 4 Units at 09/17/24 1608   insulin  glargine (LANTUS ) injection 15 Units, 15 Units, Subcutaneous, BID, Von Bellis, MD, 15 Units at 09/17/24 0916   ipratropium-albuterol  (DUONEB) 0.5-2.5 (3) MG/3ML nebulizer solution 3 mL, 3 mL, Nebulization, Q4H PRN, Amin, Ankit C, MD   levETIRAcetam  (KEPPRA ) 100 MG/ML solution 500 mg, 500 mg, Per Tube, BID, Von Bellis, MD, 500 mg at 09/17/24 9093   lidocaine  (LIDODERM ) 5 % 1 patch, 1 patch, Transdermal, Daily, Pahwani, Fredia, MD, 1 patch at  09/17/24 1710   LORazepam  (ATIVAN ) tablet 0.5 mg, 0.5 mg, Per Tube, Q6H PRN, Amin, Ankit C, MD, 0.5 mg at 09/17/24 1703   methylphenidate  (RITALIN ) tablet 5 mg, 5 mg, Per Tube, BID PRN, Von Bellis, MD   metoprolol  tartrate (LOPRESSOR ) injection 5 mg, 5 mg, Intravenous, Q4H PRN, Amin, Ankit C, MD   naloxone  (NARCAN ) injection 0.4 mg, 0.4 mg, Intravenous, PRN, Alfornia Madison, MD   nystatin  (MYCOSTATIN /NYSTOP ) topical powder 1 Application, 100,000 Units, Topical, TID, Vernon Fredia, MD, 1 Application at 09/17/24 1546   ondansetron  (ZOFRAN ) injection 4 mg, 4 mg, Intravenous, Q6H PRN, Amin, Ankit C, MD   ondansetron  (ZOFRAN ) tablet 4 mg, 4 mg, Per Tube, Q6H PRN **OR** [DISCONTINUED] ondansetron  (ZOFRAN ) injection 4 mg, 4 mg, Intravenous, Q6H PRN, Cosette Blackwater, MD, 4 mg at 09/11/24 1728   Oral care mouth rinse, 15 mL, Mouth Rinse, 4 times per day, Perri DELENA Meliton Mickey., MD, 15 mL at 09/17/24 1702   Oral care mouth rinse, 15 mL, Mouth Rinse, PRN, Perri DELENA Meliton Mickey., MD, 15 mL at 08/21/24 1145   oxyCODONE  (Oxy IR/ROXICODONE ) immediate release tablet 2.5 mg, 2.5 mg, Per Tube, Q4H PRN **OR** oxyCODONE  (Oxy IR/ROXICODONE ) immediate release tablet 5 mg, 5 mg, Per Tube, Q4H PRN, Laron Agent, RPH, 5 mg at 09/17/24 2030   oxyCODONE  (Oxy IR/ROXICODONE ) immediate release tablet 5 mg, 5 mg, Per Tube, BID, Laron Agent, RPH, 5 mg at 09/17/24 9092   polyethylene glycol (MIRALAX  / GLYCOLAX ) packet 17 g, 17 g, Per Tube, Daily, Laron Agent, RPH, 17 g at 09/15/24 1031   prochlorperazine  (COMPAZINE ) injection 10 mg, 10 mg, Intravenous, Q6H PRN, Howerter, Justin B, DO, 10 mg at 09/14/24 2035   senna-docusate (Senokot-S) tablet 1 tablet, 1 tablet, Oral, QHS PRN, Amin, Ankit C, MD   sodium chloride  flush (NS) 0.9 % injection 10-40 mL, 10-40 mL, Intracatheter, Q12H, Sigdel, Santosh, MD, 10 mL at 09/17/24 0919   sodium chloride  flush (NS) 0.9 % injection 10-40 mL, 10-40 mL, Intracatheter, PRN, Sigdel, Santosh,  MD   traZODone  (DESYREL ) tablet 25 mg, 25 mg, Per Tube, QHS PRN, Laron Agent, RPH, 25 mg at 09/17/24 2031  Labs and Diagnostic Imaging   CBC:  Recent Labs  Lab 09/15/24 0533 09/16/24 0504  WBC 6.8 7.3  HGB 9.8* 10.8*  HCT 30.5* 32.7*  MCV 94.7 92.1  PLT 230 224    Basic Metabolic Panel:  Lab Results  Component Value Date   NA 136 09/17/2024   K 4.1 09/17/2024   CO2 32 09/17/2024   GLUCOSE 204 (H) 09/17/2024   BUN 13 09/17/2024   CREATININE 0.33 (L) 09/17/2024   CALCIUM  9.1 09/17/2024   GFRNONAA >60 09/17/2024   GFRAA 94 12/16/2019   Lipid Panel:  Lab Results  Component Value Date  LDLCALC 28 05/30/2024   HgbA1c:  Lab Results  Component Value Date   HGBA1C 7.2 (H) 06/01/2024   Urine Drug Screen:     Component Value Date/Time   LABOPIA POSITIVE (A) 04/20/2024 1817   COCAINSCRNUR NONE DETECTED 04/20/2024 1817   LABBENZ NONE DETECTED 04/20/2024 1817   AMPHETMU NONE DETECTED 04/20/2024 1817   THCU NONE DETECTED 04/20/2024 1817   LABBARB NONE DETECTED 04/20/2024 1817    Alcohol Level     Component Value Date/Time   ETH <15 04/20/2024 1344   INR  Lab Results  Component Value Date   INR 1.0 09/07/2024   APTT  Lab Results  Component Value Date   APTT 67 (H) 06/21/2024   Imaging personally reviewed MRI Brain(Personally reviewed): 1. Similar evolving infarcts in the anterior and posterior circulation. 2. No new infarcts, progressive mass effect or acute hemorrhage.  Assessment   Shawana Knoch is a 64 y.o. 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  Cervical spine imaging also unremarkable for acute process.  Given the stiffness  seen on her exam coupled with altered mental status that was of unclear etiology, possibilities were considered including side effect/neurotoxicity from the PD-1 inhibitor pembrolizumab , which is being used to treat her cancer, other paraneoplastic syndrome including possibly cancer related stiff person syndrome.  GAD antibodies as well as an autoimmune serum panel were negative.  CSF was unrevealing.  Unfortunately specimen was not adequate for CSF autoimmune testing and repeat LP was not pursued given the invasiveness and difficulty getting it the first time.  Steroids were tried with no improvement and PLEX was pursued in case of an autoimmune nature or side effect of medication, and she did appear to have responded significantly to this.  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  The response to Plex continues to make me think that this is very likely immune related.  I would favor IVIG as a next step given that she did respond to plasmapheresis, but had issues with her line and we have already fully removed the Keytruda  with the Plex.  Her situation is extremely complicated, and the husband did broach the possibility of transfer and I do think it would be very reasonable to approach academic centers about whether they would have other options from a paraneoplastic treatment approach.  Given the possibility that steroids have played a role in her delirium I would not favor repeating high dose Solu-Medrol .  Recommendations  I would favor approaching academic centers for possible transfer given the complexity of her case and possible need for more aggressive immune suppression to be balanced with treating her underlying cancer. If other institutions do not feel they have anything additional to offer, or if  there is going to be undue delay, I would favor proceeding with IVIG 2 g/kg divided over 5 days Neurology will follow ______________________________________________________________________  Aisha Seals, MD [facility]   If 7pm- 7am, please page neurology on call as listed in AMION.

## 2024-09-17 NOTE — Progress Notes (Signed)
 Nutrition Follow-up  DOCUMENTATION CODES:  Non-severe (moderate) malnutrition in context of chronic illness  INTERVENTION:  Continue bolus TF via PEG: Glucerna 1.5 bolus feeds, 4 cartons per day (948 ml per day) Prosource TF20 1x/d Free water : 100 mL 5x/d (50mL before and after each bolus + 1 additional fluid bolus) Provides 1504 kcal, 98 gm protein, 820 ml free water  daily ( fee water  TF+flush) Continue current diet as ordered, encourage PO intake Monitor GOC discussions with PMT  NUTRITION DIAGNOSIS:  Moderate Malnutrition related to chronic illness (cancer) as evidenced by percent weight loss, mild muscle depletion, moderate muscle depletion (12% x 6 months). - remains applicable  GOAL:  Patient will meet greater than or equal to 90% of their needs - being addressed with tube feeds  MONITOR:  Weight trends, TF tolerance, I & O's, Labs, Diet advancement  REASON FOR ASSESSMENT:  Consult Enteral/tube feeding initiation and management  ASSESSMENT:  Pt with hx of HTN, HLD, CAD, CHF, hx CVA, uterine cancer s/p hysterectomy and adjuvant radiation therapy in 2023 with recurrance with mets in July 2025, hx MI x 3, and DM type 1 presented to [facility] ED with weakness after a recent admission from 11/9-11/11. Pt had seizures during admission and was transferred to [facility] for continuous EEG.  11/13 - presented to [facility] with chills and weakness after recent admission to [facility] 11/15 - transferred to [facility] 11/19 - cortrak placed (gastric) 12/2 - SLP BSE, NPO 12/4 - MBS, DYS1, thin liquids 12/9 - SLP BSE, regular diet to promote PO intake 12/16 - TF on hold for GI bleed, changed to glucerna 1.5 when able to restart 12/19 - first PLEX treatment (planned for 5) 12/27 - fifth (final) PLEX treatment 1/2 - Family agreed to PEG placement, IR consulted 1/6 - PEG placement in IR 1/13 - adjusted to bolus feeds  Pt resting in bed at the time of assessment.  Daughter at bedside assists with hx. Pt kept eyes closed and intermittently yelling out in pain.   Discussed with RN, bolus feeds have been well tolerated so far. Abdomen soft and pt has not reported issues. However, RN reports that she did have GERD this AM, medications given and seemed to help. Daughter at bedside also endorses that she thinks the boluses have been well tolerated. Feels that pt has been complaining of pain less.   Daughter reports that pt has not been eating much still. Will take a few bites but that she seems to fatigue quickly with chewing. Has been doing well with liquids though. Will decrease the amount of free water  being given with boluses to account for increased PO.   Admit weight: 77.9 kg Current weight: 84.5 kg   12% weight loss noted in the last 6 months to admission (since cancer recurrence) which is severe for timeframe  Nutritionally Relevant Medications: Scheduled Meds:  atorvastatin   80 mg Per Tube QHS   famotidine   20 mg Per Tube BID   GLUCERNA 1.5 CAL  237 mL Per Tube TID WC & HS   PROSource TF20  60 mL Per Tube Daily   free water   150 mL Per Tube 5 X Daily   insulin  aspart  0-5 Units Subcutaneous QHS   insulin  aspart  0-9 Units Subcutaneous TID WC   insulin  aspart  4 Units Subcutaneous TID WC   insulin  glargine  15 Units Subcutaneous BID   levETIRAcetam   500 mg Per Tube BID   polyethylene glycol  17 g Per Tube Daily   PRN Meds: alum & mag hydroxide-simeth, ondansetron , prochlorperazine , senna-docusate  Labs Reviewed: Creatinine 0.33 CBG ranges from 175-251 mg/dL over the last 24 hours HgbA1c 7.2% (06/01/24)  NUTRITION - FOCUSED PHYSICAL EXAM: Flowsheet Row Most Recent Value  Orbital Region No depletion  Upper Arm Region No depletion  Thoracic and Lumbar Region No depletion  Buccal Region No depletion  Temple Region Mild depletion  Clavicle Bone Region Mild depletion  Clavicle and Acromion Bone Region Mild depletion  Scapular Bone Region  Mild depletion  Dorsal Hand No depletion  Patellar Region Moderate depletion  Anterior Thigh Region Moderate depletion  Posterior Calf Region Moderate depletion  Edema (RD Assessment) Moderate  [BLE, BUE]  Hair Reviewed  Eyes Reviewed  Mouth Reviewed  Skin Reviewed  Nails Reviewed    Diet Order:   Diet Order             Diet regular Room service appropriate? Yes; Fluid consistency: Thin  Diet effective now                   EDUCATION NEEDS:  Education needs have been addressed  Skin:  Skin Assessment: Reviewed RN Assessment Stage 1:  - left thigh (0.5 x 0.5 cm)  Last BM:  1/14 - type 6  Height:  Ht Readings from Last 1 Encounters:  08/21/24 5' 2 (1.575 m)    Weight:  Wt Readings from Last 1 Encounters:  09/08/24 84.5 kg    Ideal Body Weight:  50 kg  BMI:  Body mass index is 34.07 kg/m.  Estimated Nutritional Needs:  Kcal:  1600-1800 kcal/d Protein:  75-90 g/d Fluid:  >/=1.6L/d    Vernell Lukes, RD, LDN, CNSC Registered Dietitian II Please reach out via secure chat

## 2024-09-17 NOTE — Procedures (Signed)
 Patient Name: Joeleen Wortley  MRN: [id number]  Epilepsy Attending: Arlin MALVA Krebs  Referring Physician/Provider: Caleen Burgess BROCKS, MD  Date: 09/17/2024 Duration: 22.31 mins  Patient history: 64yo F with ams. EEG to evaluate for seizure  Level of alertness: Awake  AEDs during EEG study: LEV, GBP  Technical aspects: This EEG study was done with scalp electrodes positioned according to the 10-20 International system of electrode placement. Electrical activity was reviewed with band pass filter of 1-70Hz , sensitivity of 7 uV/mm, display speed of 12mm/sec with a 60Hz  notched filter applied as appropriate. EEG data were recorded continuously and digitally stored.  Video monitoring was available and reviewed as appropriate.  Description: The posterior dominant rhythm consists of 7Hz  activity of moderate voltage (25-35 uV) seen predominantly in posterior head regions, symmetric and reactive to eye opening and eye closing. EEG showed continuous generalized 3 to 7 Hz theta-delta slowing. Hyperventilation and photic stimulation were not performed.     ABNORMALITY - Continuous slow, generalized  IMPRESSION: This study is suggestive of generalized cerebral dysfunction ( encephalopathy). No seizures or epileptiform discharges were seen throughout the recording.  Edmon Magid O Shamon Cothran

## 2024-09-17 NOTE — Plan of Care (Signed)
" °  Problem: Nutritional: Goal: Maintenance of adequate nutrition will improve Outcome: Progressing   Problem: Skin Integrity: Goal: Risk for impaired skin integrity will decrease Outcome: Progressing   Problem: Clinical Measurements: Goal: Respiratory complications will improve Outcome: Progressing Goal: Cardiovascular complication will be avoided Outcome: Progressing   Problem: Activity: Goal: Risk for activity intolerance will decrease Outcome: Progressing   Problem: Nutrition: Goal: Adequate nutrition will be maintained Outcome: Progressing   Problem: Elimination: Goal: Will not experience complications related to bowel motility Outcome: Progressing Goal: Will not experience complications related to urinary retention Outcome: Progressing   "

## 2024-09-17 NOTE — Inpatient Diabetes Management (Signed)
 Inpatient Diabetes Program Recommendations  AACE/ADA: New Consensus Statement on Inpatient Glycemic Control   Target Ranges:  Prepandial:   less than 140 mg/dL      Peak postprandial:   less than 180 mg/dL (1-2 hours)      Critically ill patients:  140 - 180 mg/dL    Latest Reference Range & Units 09/16/24 05:52 09/16/24 11:05 09/16/24 17:05 09/16/24 19:50 09/17/24 06:39  Glucose-Capillary 70 - 99 mg/dL 719 (H) 761 (H) 748 (H) 247 (H) 175 (H)   Review of Glycemic Control  Diabetes history: DM2 Outpatient Diabetes medications: Tresiba  25 units daily, Fiasp  1-3 units TID with meals, Metformin  1000 mg BID Current orders for Inpatient glycemic control: Lantus  15 units BID, Novolog  0-9 units TID with meals; Glucerna 237 ml AC&HS   Inpatient Diabetes Program Recommendations:     Insulin :  Please consider adding Novolog  0-5 units at bedtime and Novolog  4 units AC&HS (for tube feeding bolus).   Thanks, Earnie Gainer, RN, MSN, CDCES Diabetes Coordinator Inpatient Diabetes Program [phone] (Team Pager from 8am to 5pm)

## 2024-09-17 NOTE — Progress Notes (Signed)
 " PROGRESS NOTE    Brenda [name]  FMW:[id number] DOB: [birth year] DOA: 07/15/2024 PCP: [facility]    Brief Narrative:  64 year old with history of metastatic endometrial cancer, chronic pain, DM 2, HTN, HLD, OSA, CAD, CHF, CVA, DVT/PE on Lovenox , chronic hypoxia on 2 L nasal cannula was recently hospitalized at [facility] on 11/9 - 11/11 for UTI treated with antibiotics discharged on p.o. presented back again to the hospital due to worsening mental status.  Eventually transferred to [facility] and EEG showed concerns of seizures therefore started on Keppra .  Also received empiric antibiotics for concerns of meningitis/encephalitis but CSF panel was negative therefore discontinued.  Assessment & Plan:  Acute metabolic encephalopathy Seizures Patient undergone extensive workup.  Seen by neurology and oncology.  Overall infectious etiologies were ruled out therefore antibiotics were eventually discontinued.  MRI brain showed concerns of evolving infarct.  There was also concerns of neurotoxicity secondary to Keytruda  therefore was discontinued. -Status post PLEX x 5 days completed Dec 12/28 -Status post high-dose steroids 12/9 - 12/11 -Now on Keppra  500 mg twice daily -Will repeat EEG  Anxiety -Improved after starting BuSpar  5 mg p.o. twice daily - Continue Ativan  1 mg IV every 4 hours as needed     Metastatic Endometrial Cancer Cancer Related Pain  Seen by oncology and palliative care. She had had multiple imagings done during this hospitalization.  Per oncology note, there is no clear evidence of progression of the metastatic carcinoma. Keytruda  on hold as explained above. Pain regimen --- Scheduled: Oxycodone  5 mg every 8 hours. Reduced to BID today. --- PRN: IV Dilaudid , oxycodone , Tylenol     Recent DVT/PE,Splenic Infarct In October 2025, patient was diagnosed to have right leg DVT, PE as well as a splenic infarct and she was started on  anticoagulation.  This hospitalization, anticoagulation was held due to psoas hematoma and GI bleed.   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   Patient had BRBPR during this hospitalization BRBPR resolved at this time.  CT GI bleed negative.  Required PRBC transfusion during hospitalization.  Therapeutic Lovenox  changed to prophylaxis to Lovenox     Recurrent UTI, Klebsiella Completed 5 days of IV Rocephin  1/10   Dysphagia Moderate malnutrition -Placed PEG tube by IR on 1/6   Type 2 diabetes mellitus A1c 7.2.  On long-acting and sliding scale.  Adjust as necessary    Hypertension IV as needed     Depression/anxiety Prozac  discontinued due to rigidity -Continue Ativan  as needed   H/o CAD, Stroke HLD Currently on Lipitor  80 mg daily.  Anticoagulation on hold as discussed above   Abnormal Thyroid  Function Tests Initially low TSH and elevated free T4, was started on propranolol .  Repeat levels have normalized   Vaginal discharge Likely after recent antibiotic use received a dose of Diflucan  1/12   Hypomagnesemia As needed repletion    DVT prophylaxis: enoxaparin  (LOVENOX ) injection 40 mg Start: 09/10/24 1000 Place and maintain sequential compression device Start: 08/02/24 0738      Code Status: Limited: Do not attempt resuscitation (DNR) -DNR-LIMITED -Do Not Intubate/DNI  Family Communication:   Status is: Inpatient Remains inpatient appropriate because: SNF placement.    PT Follow up Recs: Skilled Nursing-Short Term Rehab (<3 Hours/Day)09/09/2024 1500  Subjective: Per daughter occasionally she is moaning and groaning.  Has noticed bilateral upper extremity rigidity  Examination:  General exam: Appears calm and comfortable  Respiratory system: Clear to auscultation. Respiratory effort normal. Cardiovascular system: S1 & S2 heard, RRR.  No JVD, murmurs, rubs, gallops or clicks. No pedal edema. Gastrointestinal system: Abdomen is nondistended, soft and nontender. No organomegaly or masses felt. Normal bowel sounds heard. Central nervous system: Difficult to assess Extremities: Symmetric 4 x 5 power. Skin: No rashes, lesions or ulcers Psychiatry: Judgement and insight appear poor PEG tube Right-sided chest wall Chemo-Port Foley catheter           Wound 07/25/24 1100 Pressure Injury Sacrum Medial Deep Tissue Pressure Injury - Purple or maroon localized area of discolored intact skin or blood-filled blister due to damage of underlying soft tissue from pressure and/or shear. (Active)     Wound 08/11/24 0415 Pressure Injury Vertebral column Medial Stage 2 -  Partial thickness loss of dermis presenting as a shallow open injury with a red, pink wound bed without slough. (Active)     Wound 08/31/24 2100 Pressure Injury Thigh Left;Posterior Stage 1 -  Intact skin with non-blanchable redness of a localized area usually over a bony prominence. (Active)     Diet Orders (From admission, onward)     Start     Ordered   09/09/24 0808  Diet regular Room service appropriate? Yes; Fluid consistency: Thin  Diet effective now       Question Answer Comment  Room service appropriate? Yes   Fluid consistency: Thin      09/09/24 0807            Objective: Vitals:   09/17/24 0008 09/17/24 0414 09/17/24 0815 09/17/24 1057  BP: (!) 113/54 (!) 125/58 132/64 (!) 156/73  Pulse: 70 66 67 86  Resp: 18 18 18 20   Temp: 97.8 F (36.6 C) 97.6 F (36.4 C) 98.2 F (36.8 C)   TempSrc:  Oral    SpO2: 100% 100% 100%   Weight:      Height:        Intake/Output Summary (Last 24 hours) at 09/17/2024 1129 Last data filed at 09/17/2024 0747 Gross per 24 hour  Intake 750 ml  Output 1900 ml  Net -1150 ml   Filed Weights   09/06/24 0429 09/07/24 0430 09/08/24 0500  Weight: 89.8 kg 87.1 kg 84.5 kg    Scheduled Meds:  atorvastatin    80 mg Per Tube QHS   busPIRone   5 mg Oral BID   Chlorhexidine  Gluconate Cloth  6 each Topical Daily   enoxaparin  (LOVENOX ) injection  40 mg Subcutaneous Q24H   famotidine   20 mg Per Tube BID   feeding supplement (GLUCERNA 1.5 CAL)  237 mL Per Tube TID WC & HS   feeding supplement (PROSource TF20)  60 mL Per Tube Daily   fentaNYL   1 patch Transdermal Q72H   FLUoxetine   40 mg Per Tube Daily   free water   150 mL Per Tube 5 X Daily   gabapentin   100 mg Per Tube BID   insulin  aspart  0-5 Units Subcutaneous QHS   insulin  aspart  0-9 Units Subcutaneous TID WC   insulin  aspart  4 Units Subcutaneous TID WC   insulin  glargine  15 Units Subcutaneous BID   levETIRAcetam   500 mg Per Tube BID   lidocaine   1 patch Transdermal Daily   nystatin   100,000 Units Topical TID   mouth rinse  15 mL Mouth Rinse 4 times per day   oxyCODONE   5 mg Per Tube BID   polyethylene  glycol  17 g Per Tube Daily   sodium chloride  flush  10-40 mL Intracatheter Q12H   Continuous Infusions:  Nutritional status Signs/Symptoms: percent weight loss, mild muscle depletion, moderate muscle depletion (12% x 6 months) Percent weight loss: 12 % Interventions: Prostat, Tube feeding, MVI Body mass index is 34.07 kg/m.  Data Reviewed:   CBC: Recent Labs  Lab 09/15/24 0533 09/16/24 0504  WBC 6.8 7.3  HGB 9.8* 10.8*  HCT 30.5* 32.7*  MCV 94.7 92.1  PLT 230 224   Basic Metabolic Panel: Recent Labs  Lab 09/15/24 0533 09/17/24 0500  NA 139 136  K 4.1 4.1  CL 100 99  CO2 33* 32  GLUCOSE 78 204*  BUN 14 13  CREATININE <0.30* 0.33*  CALCIUM  9.1 9.1  MG 1.6* 1.8  PHOS 3.9 3.5   GFR: Estimated Creatinine Clearance: 74.5 mL/min (A) (by C-G formula based on SCr of 0.33 mg/dL (L)). Liver Function Tests: No results for input(s): AST, ALT, ALKPHOS, BILITOT, PROT, ALBUMIN  in the last 168 hours. No results for input(s): LIPASE, AMYLASE in the last 168 hours. No results for input(s): AMMONIA in the  last 168 hours. Coagulation Profile: No results for input(s): INR, PROTIME in the last 168 hours. Cardiac Enzymes: No results for input(s): CKTOTAL, CKMB, CKMBINDEX, TROPONINI in the last 168 hours. BNP (last 3 results) Recent Labs    05/22/24 0630  PROBNP 3,514.0*   HbA1C: No results for input(s): HGBA1C in the last 72 hours. CBG: Recent Labs  Lab 09/16/24 1105 09/16/24 1705 09/16/24 1950 09/17/24 0639 09/17/24 1107  GLUCAP 238* 251* 247* 175* 246*   Lipid Profile: No results for input(s): CHOL, HDL, LDLCALC, TRIG, CHOLHDL, LDLDIRECT in the last 72 hours. Thyroid  Function Tests: Recent Labs    09/16/24 0945  TSH 3.560  FREET4 1.31   Anemia Panel: Recent Labs    09/16/24 0945  VITAMINB12 961*   Sepsis Labs: No results for input(s): PROCALCITON, LATICACIDVEN in the last 168 hours.  Recent Results (from the past 240 hours)  Remove and replace urinary cath (placed > 5 days) then obtain urine culture from new indwelling urinary catheter.     Status: Abnormal   Collection Time: 09/07/24  4:09 PM   Specimen: Urine, Catheterized  Result Value Ref Range Status   Specimen Description URINE, CATHETERIZED  Final   Special Requests   Final    NONE Performed at [facility], [address], [place], [place] [address]    Culture 80,000 COLONIES/mL KLEBSIELLA PNEUMONIAE (A)  Final   Report Status 09/09/2024 FINAL  Final   Organism ID, Bacteria KLEBSIELLA PNEUMONIAE (A)  Final      Susceptibility   Klebsiella pneumoniae - MIC*    AMPICILLIN  >=32 RESISTANT Resistant     CEFAZOLIN  (URINE) Value in next row Sensitive      2 SENSITIVEThis is a modified FDA-approved test that has been validated and its performance characteristics determined by the reporting laboratory.  This laboratory is certified under the Clinical Laboratory Improvement Amendments CLIA as qualified to perform high complexity clinical laboratory testing.    CEFEPIME  Value in  next row Sensitive      2 SENSITIVEThis is a modified FDA-approved test that has been validated and its performance characteristics determined by the reporting laboratory.  This laboratory is certified under the Clinical Laboratory Improvement Amendments CLIA as qualified to perform high complexity clinical laboratory testing.    ERTAPENEM Value in next row Sensitive  2 SENSITIVEThis is a modified FDA-approved test that has been validated and its performance characteristics determined by the reporting laboratory.  This laboratory is certified under the Clinical Laboratory Improvement Amendments CLIA as qualified to perform high complexity clinical laboratory testing.    CEFTRIAXONE  Value in next row Sensitive      2 SENSITIVEThis is a modified FDA-approved test that has been validated and its performance characteristics determined by the reporting laboratory.  This laboratory is certified under the Clinical Laboratory Improvement Amendments CLIA as qualified to perform high complexity clinical laboratory testing.    CIPROFLOXACIN  Value in next row Intermediate      2 SENSITIVEThis is a modified FDA-approved test that has been validated and its performance characteristics determined by the reporting laboratory.  This laboratory is certified under the Clinical Laboratory Improvement Amendments CLIA as qualified to perform high complexity clinical laboratory testing.    GENTAMICIN Value in next row Sensitive      2 SENSITIVEThis is a modified FDA-approved test that has been validated and its performance characteristics determined by the reporting laboratory.  This laboratory is certified under the Clinical Laboratory Improvement Amendments CLIA as qualified to perform high complexity clinical laboratory testing.    NITROFURANTOIN Value in next row Resistant      2 SENSITIVEThis is a modified FDA-approved test that has been validated and its performance characteristics determined by the reporting  laboratory.  This laboratory is certified under the Clinical Laboratory Improvement Amendments CLIA as qualified to perform high complexity clinical laboratory testing.    TRIMETH /SULFA  Value in next row Sensitive      2 SENSITIVEThis is a modified FDA-approved test that has been validated and its performance characteristics determined by the reporting laboratory.  This laboratory is certified under the Clinical Laboratory Improvement Amendments CLIA as qualified to perform high complexity clinical laboratory testing.    AMPICILLIN /SULBACTAM Value in next row Resistant      2 SENSITIVEThis is a modified FDA-approved test that has been validated and its performance characteristics determined by the reporting laboratory.  This laboratory is certified under the Clinical Laboratory Improvement Amendments CLIA as qualified to perform high complexity clinical laboratory testing.    PIP/TAZO Value in next row Sensitive      16 SENSITIVEThis is a modified FDA-approved test that has been validated and its performance characteristics determined by the reporting laboratory.  This laboratory is certified under the Clinical Laboratory Improvement Amendments CLIA as qualified to perform high complexity clinical laboratory testing.    MEROPENEM  Value in next row Sensitive      16 SENSITIVEThis is a modified FDA-approved test that has been validated and its performance characteristics determined by the reporting laboratory.  This laboratory is certified under the Clinical Laboratory Improvement Amendments CLIA as qualified to perform high complexity clinical laboratory testing.    * 80,000 COLONIES/mL KLEBSIELLA PNEUMONIAE         Radiology Studies: No results found.         LOS: 62 days   Time spent= 35 mins    Burgess JAYSON Dare, MD [facility]  If 7PM-7AM, please contact night-coverage  09/17/2024, 11:29 AM  "

## 2024-09-18 DIAGNOSIS — G9341 Metabolic encephalopathy: Secondary | ICD-10-CM | POA: Diagnosis not present

## 2024-09-18 LAB — GLUCOSE, CAPILLARY
Glucose-Capillary: 116 mg/dL — ABNORMAL HIGH (ref 70–99)
Glucose-Capillary: 177 mg/dL — ABNORMAL HIGH (ref 70–99)
Glucose-Capillary: 348 mg/dL — ABNORMAL HIGH (ref 70–99)
Glucose-Capillary: 357 mg/dL — ABNORMAL HIGH (ref 70–99)

## 2024-09-18 MED ORDER — HALOPERIDOL LACTATE 5 MG/ML IJ SOLN
5.0000 mg | Freq: Once | INTRAMUSCULAR | Status: AC | PRN
  Administered 2024-09-18: 5 mg via INTRAVENOUS
  Filled 2024-09-18: qty 1

## 2024-09-18 MED ORDER — OXYCODONE HCL 5 MG PO TABS
5.0000 mg | ORAL_TABLET | Freq: Once | ORAL | Status: AC
  Administered 2024-09-18: 5 mg
  Filled 2024-09-18: qty 1

## 2024-09-18 MED ORDER — FLUCONAZOLE 150 MG PO TABS
150.0000 mg | ORAL_TABLET | Freq: Once | ORAL | Status: AC
  Administered 2024-09-18: 150 mg via ORAL
  Filled 2024-09-18: qty 1

## 2024-09-18 MED ORDER — TERBINAFINE HCL 1 % EX CREA
1.0000 | TOPICAL_CREAM | Freq: Two times a day (BID) | CUTANEOUS | Status: AC
  Administered 2024-09-18 – 2024-10-01 (×27): 1 via TOPICAL
  Filled 2024-09-18: qty 12

## 2024-09-18 MED ORDER — VALACYCLOVIR HCL 500 MG PO TABS
1000.0000 mg | ORAL_TABLET | Freq: Three times a day (TID) | ORAL | Status: AC
  Administered 2024-09-18 – 2024-09-25 (×21): 1000 mg via ORAL
  Filled 2024-09-18 (×23): qty 2

## 2024-09-18 NOTE — Progress Notes (Signed)
 RN notified that patient was noted to have some vesicular rash around left back axilla.  It is difficult to say if this is zoster/varicella rash/dissemination versus anything else.  I did start the patient on valacyclovir , placed on airborne precaution.  I also discussed case with ID, Dr Fleeta Rothman, who additionally recommended sending off HSV swab as he also seems to think it is hard to tell for now in her immunocompromise state.  I did notify [facility] transfer center regarding her room status change to airborne due to current clinical concern.  I spoke to the patient's husband [name] regarding her update but he happened to question our diagnosis and concern.  I happened to mention to him that this is precautionary as God forbid if this is disseminated virus, we would not want to delay any treatment.  He immediately started saying  and questioning Which god?.  I told him this is just a saying but I am not referring to any specific god. In any case I requested him to move on from what I said, so I can address and answer any important medical questions.  Then he kept asking me if I knew where the rule of thumb saying comes from.  I kindly requested to move on from this conversation as there is no point in that discussion at this time.  Before the conversation escalated, I had to let him know we will call him back with any clinical updates otherwise I will hang up at this time and keep the daughter posted in the future.   RN staff updated.   Burgess Dare MD [facility]

## 2024-09-18 NOTE — Progress Notes (Signed)
 Notified Dr. Caleen after finding rash wrapping from L axila across patient's mid upper back and placing images in patient's chart. Early this AM when Dr. Cloretta rounded he noticed similar areas of rash,  of which I informed Dr. Caleen and which Dr.Sherrill mentioned in notes.   ID consulted, and Patient husband informed of transfer to negative pressure room dt concern for rash being shingles. Patient's husband upset stating  I will just take her home and  How come everyone I know who has had shingles in the hospital goes home, it's just a rash it's not contagious attempted to educate, but husband visibly upset. Informed Dr. Caleen who then called husband at bedside and spoke with husband.

## 2024-09-18 NOTE — Progress Notes (Signed)
 IP PROGRESS NOTE  Subjective:   Brenda [name] was lethargic when I saw her at approximately 7:30 AM.  Her daughter was at the bedside.  The bedside RN reports she has been lethargic since receiving Ativan  last night.  They report she had been agitated and yelling out prior to receiving Ativan .   Objective: Vital signs in last 24 hours: Blood pressure (!) 147/69, pulse 75, temperature 98.6 F (37 C), temperature source Oral, resp. rate 20, height 5' 2 (1.575 m), weight 186 lb 4.6 oz (84.5 kg), last menstrual period 10/30/2014, SpO2 98%.  Intake/Output from previous day: 01/15 0701 - 01/16 0700 In: 600 [NG/GT:600] Out: 2500 [Urine:2500]  Physical Exam: She appears comfortable  Neurologic: Lethargic, opens mouth and yawns with sternal rub, not speaking, not following commands withdraws toes to noxious stimuli Cardiac: Regular rhythm Lungs: Distant breath sounds, no respiratory distress Abdomen: Left upper quadrant feeding tube with a gauze dressing, firmness deep to the umbilicus Vascular: No leg edema, SCDs in place Skin: Tape reaction surrounding the Port-A-Cath, erythematous-vesicular?  Rash at the central and left upper anterior chest, erythema at the left axilla  Portacath/PICC-without erythema  Lab Results: Recent Labs    09/16/24 0504  WBC 7.3  HGB 10.8*  HCT 32.7*  PLT 224     BMET Recent Labs    09/17/24 0500  NA 136  K 4.1  CL 99  CO2 32  GLUCOSE 204*  BUN 13  CREATININE 0.33*  CALCIUM  9.1       Medications: I have reviewed the patient's current medications.  Assessment/Plan:  Endometrial cancer Diagnosed in 2023, status post surgery and radiation Recurrent disease July 2025 with a left retroperitoneal mass, biopsy confirmed adenocarcinoma, ER positive, MSI-high, tumor mutation burden 24, PD-L1 75%, PIK 3CA mutated, PTEN mutated, HER2 1+, loss of MLH1 expression 04/03/2024 CT chest: Spiculated 9 x 12 mm right upper lobe and part solid 4 x 6 mm  right upper lobe nodules Cycle 1 paclitaxel /carboplatin /pembrolizumab  05/01/2024 Cycle 2 paclitaxel /carboplatin /pembrolizumab  06/30/2024 06/19/2024 CT abdomen/pelvis: Right lower lobe pulmonary embolism, splenic infarct, stable size of the left psoas mass with evidence of erosive change at the anterior aspect of L2, decreased size of left external iliac node 08/09/2024 CTs: New psoas muscle mass (tumor versus hematoma), left psoas/paraspinal mass has decreased in size, by my review the previously noted lung nodules have resolved 08/18/2024 CT angiogram abdomen/pelvis-no evidence of active GI bleed, right psoas hematoma, L2 sclerotic change with left paraspinal mass  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  DVT 06/02/2024 4.  Pulmonary embolism noted on CT chest 06/19/2024 5.  Diabetes 6.  CHF/CAD 7.  Seizures, cefepime  toxicity?  November 2025 8.  Progressive generalized loss of motor function, altered mental status-related to CVAs?,  Paraneoplastic?  Toxicity from immunotherapy? 08/11/2024-Solu-Medrol  daily x 3 Therapy plasma exchange 08/21/2024, 08/23/2024, 08/25/2024, 08/27/2024, 08/29/2024 9.  History of respiratory failure 10.  Pain secondary to #1 11.  Anemia-multifactorial, 1 unit RBCs 09/07/2024 12.  History of elevated liver enzymes, now normal   Brenda [name] has a history of endometrial cancer initially diagnosed in 2020.  She was diagnosed with recurrent disease involving a left retroperitoneal mass in July 2025.  She completed 2 treatments with paclitaxel /carboplatin /pembrolizumab .  She developed multiple additional diagnoses over the past several months including embolic strokes,  urinary tract infections, seizures felt to be secondary to cefepime , dysphagia requiring placement of a feeding  tube (PEG 09/09/2023), and progressive diffuse motor weakness.  She completed 2 cycles of systemic  therapy for treatment of the uterine cancer.  I reviewed the 08/09/2024 restaging CT findings with Ms. Sandoval and her daughter.  I reviewed the 08/18/2024 CT angiogram abdomen/pelvis.  The known malignancy at the left retroperitoneum has improved significantly.  The right psoas mass appears to represent a hematoma.      The etiology of the diffuse generalized weakness remains unclear.  There is likely a component related to the embolic CVAs and deconditioning, but the loss of motor function  is more severe than expected.  She has undergone an extensive neurologic evaluation. Neurology is following her in the hospital..  Neurology feels her symptoms may be related to PD-1 inhibitor neurotoxicity, a paraneoplastic syndrome, or another inflammatory process.  She started high-dose Solu-Medrol  on 08/11/2024, given daily for 3 days.  Her neurologic status did not improve.  She began plasma exchange on 08/21/2024.  She completed treatment #5 on 08/29/2024.  There was initial improvement in her neurologic status with more movement and verbal interaction, but she has not shown further improvement over the past week.  She was evaluated by neurology yesterday and noted to have a decline in her neurologic status.  A paraneoplastic syndrome is felt to be a possible.   The etiology of her pain is unclear based on the restaging CT.  The retroperitoneal mass is much smaller.  She could be having pain from the right psoas lesion or another etiology.  There is no clear evidence for progression of the metastatic carcinoma.  I recommend attempting to wean the CNS acting agents including narcotics .  She has persistent anemia.  The anemia is multifactorial including components related to bleeding, phlebotomy, and chronic disease.  She will not be a candidate for further treatment of the metastatic endometrial cancer unless her neurologic status and overall performance status continue to improve.  The loss of MLH1/PMS2  expression on the retroperitoneal biopsy from August 2025 is very likely to indicate a sporadic tumor.  Germline testing cannot be obtained while she is in the hospital.  I updated her daughter at the bedside.  I am available to speak with her husband as needed.  The neurology recommendation to consider transfer to an academic Medical Center is noted.  I support this recommendation.  IVIG is recommended by neurology if she cannot be transferred.  Recommendations: Wean narcotics and other CNS acting agents as tolerated Continue management of of her neurologic status, IVIG per neurology Anticoagulation with prophylactic dose Lovenox  08/31/2024 tube feedings, repeat speech pathology evaluation- advance diet as tolerated Systemic treatment for the uterine cancer will remain on hold Continue goals of care discussions by palliative care and the medical service I support transfer to an academic Medical Center Please call oncology as needed, I will continue checking on her periodically           LOS: 63 days   Arley Hof, MD   09/18/2024, 8:28 AM

## 2024-09-18 NOTE — Progress Notes (Addendum)
 I discussed patient's care with Dr. Reta from neurology at [facility] via transfer line.  We discussed her case in detail in terms of her hospital course during this hospitalization.  He agrees that patient would benefit from maybe second opinion from their neurology team as there is concerns of paraneoplastic.  He has accepted the patient to be transferred under his care. Currently there are no beds available therefore she has been placed on transfer list. I have updated patient's daughter [name].  I will also let her know that at this time I do not have the exact timeline for her transfer but it can happen anytime based on the bed availability.  She understands this.  Neurology, oncology, TOC and nursing staff has been updated as well.  Burgess Dare MD

## 2024-09-18 NOTE — Progress Notes (Signed)
 " PROGRESS NOTE    Brenda [name]  FMW:[id number] DOB: 1961/06/18 DOA: 07/15/2024 PCP: [facility]    Brief Narrative:  64 year old with history of metastatic endometrial cancer, chronic pain, DM 2, HTN, HLD, OSA, CAD, CHF, CVA, DVT/PE on Lovenox , chronic hypoxia on 2 L nasal cannula was recently hospitalized at [facility] on 11/9 - 11/11 for UTI treated with antibiotics discharged on p.o. presented back again to the hospital due to worsening mental status.  Eventually transferred to [facility] and EEG showed concerns of seizures therefore started on Keppra .  Also received empiric antibiotics for concerns of meningitis/encephalitis but CSF panel was negative therefore discontinued.  Assessment & Plan:  Acute metabolic encephalopathy Seizures Patient undergone extensive workup.  Seen by neurology and oncology.  Overall infectious etiologies were ruled out therefore antibiotics were eventually discontinued.  MRI brain showed concerns of evolving infarct.  There was also concerns of neurotoxicity secondary to Keytruda  therefore was discontinued. -Status post PLEX x 5 days completed Dec 12/28 -Status post high-dose steroids 12/9 - 12/11 -Now on Keppra  500 mg twice daily - Repeat EEG unremarkable - Seen by neurology again, considering IVIG  Attempted to transfer her to [facility] on 1/16 for second opinion, rejected.  Will try and Attempt [facility] later today.   Left scalp ring lesion -Concerns of tenia corporis therefore we will start terbinafine  topical for 2 weeks, EOT 1/29  Anxiety -Improved after starting BuSpar  5 mg p.o. twice daily - Continue Ativan  1 mg IV every 4 hours as needed     Metastatic Endometrial Cancer Cancer Related Pain  Seen by oncology and palliative care. She had had multiple imagings done during this hospitalization.  Per oncology note, there is no clear evidence of progression of the metastatic carcinoma. Keytruda  on hold as  explained above. Pain regimen --- Scheduled: Oxycodone  5 mg every 8 hours. Reduced to BID today. --- PRN: IV Dilaudid , oxycodone , Tylenol     Recent DVT/PE,Splenic Infarct In October 2025, patient was diagnosed to have right leg DVT, PE as well as a splenic infarct and she was started on anticoagulation.  This hospitalization, anticoagulation was held due to psoas hematoma and GI bleed.   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   Patient had BRBPR during this hospitalization BRBPR resolved at this time.  CT GI bleed negative.  Required PRBC transfusion during hospitalization.  Therapeutic Lovenox  changed to prophylaxis to Lovenox     Recurrent UTI, Klebsiella Completed 5 days of IV Rocephin  1/10   Dysphagia Moderate malnutrition -Placed PEG tube by IR on 1/6   Type 2 diabetes mellitus A1c 7.2.  On long-acting and sliding scale.  Adjust as necessary    Hypertension IV as needed     Depression/anxiety Prozac  discontinued due to rigidity -Continue Ativan  as needed   H/o CAD, Stroke HLD Currently on Lipitor  80 mg daily.  Anticoagulation on hold as discussed above   Abnormal Thyroid  Function Tests Initially low TSH and elevated free T4, was started on propranolol .  Repeat levels have normalized   Vaginal discharge, persist Received a dose of Diflucan  1/12, will give 1 additional dose of Diflucan    Hypomagnesemia As needed repletion    DVT prophylaxis: Lovenox     Code Status: Limited: Do not attempt resuscitation (DNR) -DNR-LIMITED -Do Not Intubate/DNI  Family Communication:   Status is: Inpatient Remains inpatient  appropriate because: SNF placement.    PT Follow up Recs: Skilled Nursing-Short Term Rehab (<3 Hours/Day)09/09/2024 1500  Subjective: Per daughter occasionally she is moaning and groaning.  Has noticed bilateral upper extremity  rigidity  Examination:  General exam: Appears calm and comfortable  Respiratory system: Clear to auscultation. Respiratory effort normal. Cardiovascular system: S1 & S2 heard, RRR. No JVD, murmurs, rubs, gallops or clicks. No pedal edema. Gastrointestinal system: Abdomen is nondistended, soft and nontender. No organomegaly or masses felt. Normal bowel sounds heard. Central nervous system: Difficult to assess Extremities: Symmetric 4 x 5 power. Skin: Left scalp ring lesion Psychiatry: Judgement and insight appear poor PEG tube Right-sided chest wall Chemo-Port Foley catheter           Wound 07/25/24 1100 Pressure Injury Sacrum Medial Deep Tissue Pressure Injury - Purple or maroon localized area of discolored intact skin or blood-filled blister due to damage of underlying soft tissue from pressure and/or shear. (Active)     Wound 08/11/24 0415 Pressure Injury Vertebral column Medial Stage 2 -  Partial thickness loss of dermis presenting as a shallow open injury with a red, pink wound bed without slough. (Active)     Wound 08/31/24 2100 Pressure Injury Thigh Left;Posterior Stage 1 -  Intact skin with non-blanchable redness of a localized area usually over a bony prominence. (Active)     Diet Orders (From admission, onward)     Start     Ordered   09/09/24 0808  Diet regular Room service appropriate? Yes; Fluid consistency: Thin  Diet effective now       Question Answer Comment  Room service appropriate? Yes   Fluid consistency: Thin      09/09/24 0807            Objective: Vitals:   09/17/24 2133 09/17/24 2307 09/18/24 0424 09/18/24 0801  BP: 136/66 (!) 161/91 (!) 142/70 (!) 147/69  Pulse: 80 91 70 75  Resp: 18 18 18 20   Temp: 99.3 F (37.4 C) 100 F (37.8 C) 99.1 F (37.3 C) 98.6 F (37 C)  TempSrc: Oral Oral Oral Oral  SpO2: 100% 100% 100% 98%  Weight:      Height:        Intake/Output Summary (Last 24 hours) at 09/18/2024 1101 Last data filed at  09/18/2024 0800 Gross per 24 hour  Intake 550 ml  Output 1550 ml  Net -1000 ml   Filed Weights   09/06/24 0429 09/07/24 0430 09/08/24 0500  Weight: 89.8 kg 87.1 kg 84.5 kg    Scheduled Meds:  atorvastatin   80 mg Per Tube QHS   busPIRone   5 mg Oral BID   Chlorhexidine  Gluconate Cloth  6 each Topical Daily   enoxaparin  (LOVENOX ) injection  40 mg Subcutaneous Q24H   famotidine   20 mg Per Tube BID   feeding supplement (GLUCERNA 1.5 CAL)  237 mL Per Tube TID WC & HS   feeding supplement (PROSource TF20)  60 mL Per Tube Daily   fentaNYL   1 patch Transdermal Q72H   FLUoxetine   40 mg Per Tube Daily   free water   100 mL Per Tube 5 X Daily   gabapentin   100 mg Per Tube BID   insulin  aspart  0-5 Units Subcutaneous QHS   insulin  aspart  0-9 Units Subcutaneous TID WC   insulin  aspart  4 Units Subcutaneous TID WC   insulin  glargine  15 Units Subcutaneous BID   levETIRAcetam   500 mg Per Tube BID   lidocaine   1 patch Transdermal Daily   nystatin   100,000 Units Topical TID   mouth rinse  15 mL Mouth Rinse 4 times per day   oxyCODONE   5 mg Per Tube BID   polyethylene glycol  17 g Per Tube Daily   sodium chloride  flush  10-40 mL Intracatheter Q12H   terbinafine    Topical BID   Continuous Infusions:  Nutritional status Signs/Symptoms: percent weight loss, mild muscle depletion, moderate muscle depletion (12% x 6 months) Percent weight loss: 12 % Interventions: Prostat, Tube feeding, MVI Body mass index is 34.07 kg/m.  Data Reviewed:   CBC: Recent Labs  Lab 09/15/24 0533 09/16/24 0504  WBC 6.8 7.3  HGB 9.8* 10.8*  HCT 30.5* 32.7*  MCV 94.7 92.1  PLT 230 224   Basic Metabolic Panel: Recent Labs  Lab 09/15/24 0533 09/17/24 0500  NA 139 136  K 4.1 4.1  CL 100 99  CO2 33* 32  GLUCOSE 78 204*  BUN 14 13  CREATININE <0.30* 0.33*  CALCIUM  9.1 9.1  MG 1.6* 1.8  PHOS 3.9 3.5   GFR: Estimated Creatinine Clearance: 74.5 mL/min (A) (by C-G formula based on SCr of 0.33 mg/dL  (L)). Liver Function Tests: No results for input(s): AST, ALT, ALKPHOS, BILITOT, PROT, ALBUMIN  in the last 168 hours. No results for input(s): LIPASE, AMYLASE in the last 168 hours. No results for input(s): AMMONIA in the last 168 hours. Coagulation Profile: No results for input(s): INR, PROTIME in the last 168 hours. Cardiac Enzymes: No results for input(s): CKTOTAL, CKMB, CKMBINDEX, TROPONINI in the last 168 hours. BNP (last 3 results) Recent Labs    05/22/24 0630  PROBNP 3,514.0*   HbA1C: No results for input(s): HGBA1C in the last 72 hours. CBG: Recent Labs  Lab 09/17/24 0639 09/17/24 1107 09/17/24 1518 09/17/24 2133 09/18/24 0643  GLUCAP 175* 246* 330* 277* 116*   Lipid Profile: No results for input(s): CHOL, HDL, LDLCALC, TRIG, CHOLHDL, LDLDIRECT in the last 72 hours. Thyroid  Function Tests: Recent Labs    09/16/24 0945  TSH 3.560  FREET4 1.31   Anemia Panel: Recent Labs    09/16/24 0945  VITAMINB12 961*   Sepsis Labs: No results for input(s): PROCALCITON, LATICACIDVEN in the last 168 hours.  No results found for this or any previous visit (from the past 240 hours).       Radiology Studies: EEG adult Result Date: 09/17/2024 Shelton Arlin KIDD, MD     09/17/2024  2:49 PM Patient Name: Brenda [name] MRN: [id number] Epilepsy Attending: Arlin KIDD Shelton Referring Physician/Provider: Caleen Burgess BROCKS, MD Date: 09/17/2024 Duration: 22.31 mins Patient history: 64yo F with ams. EEG to evaluate for seizure Level of alertness: Awake AEDs during EEG study: LEV, GBP Technical aspects: This EEG study was done with scalp electrodes positioned according to the 10-20 International system of electrode placement. Electrical activity was reviewed with band pass filter of 1-70Hz , sensitivity of 7 uV/mm, display speed of 26mm/sec with a 60Hz  notched filter applied as appropriate. EEG data were recorded continuously and digitally  stored.  Video monitoring was available and reviewed as appropriate. Description: The posterior dominant rhythm consists of 7Hz  activity of moderate voltage (25-35 uV) seen predominantly in posterior head regions, symmetric and reactive to eye opening and eye closing. EEG showed continuous generalized 3 to 7 Hz theta-delta slowing. Hyperventilation and photic stimulation were not performed.   ABNORMALITY - Continuous slow, generalized IMPRESSION: This study is suggestive of generalized cerebral dysfunction ( encephalopathy). No seizures or  epileptiform discharges were seen throughout the recording. Brenda [name]           LOS: 63 days   Time spent= 35 mins    Burgess JAYSON Dare, MD [facility]  If 7PM-7AM, please contact night-coverage  09/18/2024, 11:01 AM  "

## 2024-09-18 NOTE — Progress Notes (Signed)
 Lenward RN from [facility] called for updated VS and information on drips /patient IV access. Currently no bed availability, but stated that they are looking at possible transfer to [facility] as well when bed becomes available. Full Report will be called when bed is available.

## 2024-09-18 NOTE — Plan of Care (Signed)
 Problem: Education: Goal: Ability to describe self-care measures that may prevent or decrease complications (Diabetes Survival Skills Education) will improve Outcome: Not Progressing Goal: Individualized Educational Video(s) Outcome: Not Progressing   Problem: Coping: Goal: Ability to adjust to condition or change in health will improve Outcome: Not Progressing   Problem: Fluid Volume: Goal: Ability to maintain a balanced intake and output will improve Outcome: Not Progressing   Problem: Health Behavior/Discharge Planning: Goal: Ability to identify and utilize available resources and services will improve Outcome: Not Progressing Goal: Ability to manage health-related needs will improve Outcome: Not Progressing   Problem: Metabolic: Goal: Ability to maintain appropriate glucose levels will improve Outcome: Not Progressing   Problem: Nutritional: Goal: Maintenance of adequate nutrition will improve Outcome: Not Progressing Goal: Progress toward achieving an optimal weight will improve Outcome: Not Progressing   Problem: Skin Integrity: Goal: Risk for impaired skin integrity will decrease Outcome: Not Progressing   Problem: Tissue Perfusion: Goal: Adequacy of tissue perfusion will improve Outcome: Not Progressing   Problem: Education: Goal: Knowledge of General Education information will improve Description: Including pain rating scale, medication(s)/side effects and non-pharmacologic comfort measures Outcome: Not Progressing   Problem: Health Behavior/Discharge Planning: Goal: Ability to manage health-related needs will improve Outcome: Not Progressing   Problem: Clinical Measurements: Goal: Ability to maintain clinical measurements within normal limits will improve Outcome: Not Progressing Goal: Will remain free from infection Outcome: Not Progressing Goal: Diagnostic test results will improve Outcome: Not Progressing Goal: Respiratory complications will  improve Outcome: Not Progressing Goal: Cardiovascular complication will be avoided Outcome: Not Progressing   Problem: Activity: Goal: Risk for activity intolerance will decrease Outcome: Not Progressing   Problem: Nutrition: Goal: Adequate nutrition will be maintained Outcome: Not Progressing   Problem: Coping: Goal: Level of anxiety will decrease Outcome: Not Progressing   Problem: Elimination: Goal: Will not experience complications related to bowel motility Outcome: Not Progressing Goal: Will not experience complications related to urinary retention Outcome: Not Progressing   Problem: Pain Managment: Goal: General experience of comfort will improve and/or be controlled Outcome: Not Progressing   Problem: Safety: Goal: Ability to remain free from injury will improve Outcome: Not Progressing   Problem: Skin Integrity: Goal: Risk for impaired skin integrity will decrease Outcome: Not Progressing   Problem: Education: Goal: Knowledge of General Education information will improve Description: Including pain rating scale, medication(s)/side effects and non-pharmacologic comfort measures Outcome: Not Progressing   Problem: Health Behavior/Discharge Planning: Goal: Ability to manage health-related needs will improve Outcome: Not Progressing   Problem: Clinical Measurements: Goal: Ability to maintain clinical measurements within normal limits will improve Outcome: Not Progressing Goal: Will remain free from infection Outcome: Not Progressing Goal: Diagnostic test results will improve Outcome: Not Progressing Goal: Respiratory complications will improve Outcome: Not Progressing Goal: Cardiovascular complication will be avoided Outcome: Not Progressing   Problem: Activity: Goal: Risk for activity intolerance will decrease Outcome: Not Progressing   Problem: Nutrition: Goal: Adequate nutrition will be maintained Outcome: Not Progressing   Problem:  Coping: Goal: Level of anxiety will decrease Outcome: Not Progressing   Problem: Elimination: Goal: Will not experience complications related to bowel motility Outcome: Not Progressing Goal: Will not experience complications related to urinary retention Outcome: Not Progressing   Problem: Pain Managment: Goal: General experience of comfort will improve and/or be controlled Outcome: Not Progressing   Problem: Safety: Goal: Ability to remain free from injury will improve Outcome: Not Progressing   Problem: Skin Integrity: Goal: Risk for impaired skin integrity  will decrease Outcome: Not Progressing

## 2024-09-18 NOTE — TOC Progression Note (Addendum)
 Transition of Care [facility]) - Progression Note    Patient Details  Name: Brenda [name] MRN: [id number] Date of Birth: 05/29/1961  Transition of Care [facility] CM/SW Contact  [name], [place] Phone Number: 09/18/2024, 11:32 AM  Clinical Narrative:   CSW updated by MD that medical workup is ongoing again, not ready for disposition at this time. CSW to follow.  UPDATE 3:29 PM: CSW received a call from patient's PCP, [employer]. CSW provided update that patient has been accepted for transfer to [facility], awaiting a bed. CSW to follow.    Expected Discharge Plan: Skilled Nursing Facility Barriers to Discharge: Continued Medical Work up, English As A Second Language Teacher, Inadequate or no insurance, SNF Pending bed offer               Expected Discharge Plan and Services                                               Social Drivers of Health (SDOH) Interventions SDOH Screenings   Food Insecurity: No Food Insecurity (07/16/2024)  Recent Concern: Food Insecurity - Food Insecurity Present (04/20/2024)  Housing: Low Risk (07/16/2024)  Transportation Needs: No Transportation Needs (07/16/2024)  Utilities: Not At Risk (07/16/2024)  Depression (PHQ2-9): Low Risk (06/30/2024)  Recent Concern: Depression (PHQ2-9) - Medium Risk (05/07/2024)  Social Connections: Moderately Integrated (07/13/2024)  Tobacco Use: Medium Risk (09/07/2024)    Readmission Risk Interventions    07/14/2024    2:34 PM 05/04/2024   10:00 AM  Readmission Risk Prevention Plan  Transportation Screening Complete Complete  PCP or Specialist Appt within 3-5 Days  Complete  HRI or Home Care Consult  Complete  Social Work Consult for Recovery Care Planning/Counseling  Complete  Palliative Care Screening  Not Applicable  Medication Review Oceanographer) Complete Complete  PCP or Specialist appointment within 3-5 days of discharge Complete   HRI or Home Care Consult Complete   SW Recovery Care/Counseling  Consult Complete   Palliative Care Screening Not Applicable   Skilled Nursing Facility Not Applicable

## 2024-09-19 ENCOUNTER — Other Ambulatory Visit: Payer: Self-pay | Admitting: Infectious Disease

## 2024-09-19 DIAGNOSIS — B029 Zoster without complications: Secondary | ICD-10-CM

## 2024-09-19 DIAGNOSIS — R569 Unspecified convulsions: Secondary | ICD-10-CM | POA: Diagnosis not present

## 2024-09-19 DIAGNOSIS — G9340 Encephalopathy, unspecified: Secondary | ICD-10-CM | POA: Diagnosis not present

## 2024-09-19 DIAGNOSIS — G9341 Metabolic encephalopathy: Secondary | ICD-10-CM | POA: Diagnosis not present

## 2024-09-19 DIAGNOSIS — L989 Disorder of the skin and subcutaneous tissue, unspecified: Secondary | ICD-10-CM

## 2024-09-19 DIAGNOSIS — L130 Dermatitis herpetiformis: Secondary | ICD-10-CM | POA: Diagnosis not present

## 2024-09-19 DIAGNOSIS — Z8542 Personal history of malignant neoplasm of other parts of uterus: Secondary | ICD-10-CM

## 2024-09-19 LAB — GLUCOSE, CAPILLARY
Glucose-Capillary: 238 mg/dL — ABNORMAL HIGH (ref 70–99)
Glucose-Capillary: 198 mg/dL — ABNORMAL HIGH (ref 70–99)
Glucose-Capillary: 378 mg/dL — ABNORMAL HIGH (ref 70–99)

## 2024-09-19 MED ORDER — INSULIN GLARGINE 100 UNIT/ML ~~LOC~~ SOLN
20.0000 [IU] | Freq: Two times a day (BID) | SUBCUTANEOUS | Status: DC
  Administered 2024-09-19: 20 [IU] via SUBCUTANEOUS
  Filled 2024-09-19 (×3): qty 0.2

## 2024-09-19 NOTE — Consult Note (Signed)
 "       Date of Admission:  07/15/2024          Reason for Consult: Vesicular rash with concern for VZV    Referring Provider: Burgess Dare, MD   Assessment:  Encephalopathy of unknown cause with extensive workup History of endometrial cancer Seizure disorder Vesicular rash around back and axilla concerning for VZV infection vs HSV infection  Plan:  Sending PCRs for HSV and VZV PCR Continue valtrex  Continue airborne contact precautions If this is VZV she needs airborne, precautions until all the lesions have scabbed over.  We will follow-up on PCR's but sign off for now.  Please call with further questions.  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   History of thromboembolism   DNR (do not resuscitate)   UTI (urinary tract infection)   Leukopenia   Seizures (HCC)   Hypernatremia   Malnutrition of moderate degree   Rigidity   Scheduled Meds:  atorvastatin   80 mg Per Tube QHS   busPIRone   5 mg Oral BID   Chlorhexidine  Gluconate Cloth  6 each Topical Daily   enoxaparin  (LOVENOX ) injection  40 mg Subcutaneous Q24H   famotidine   20 mg Per Tube BID   feeding supplement (GLUCERNA 1.5 CAL)  237 mL Per Tube TID WC & HS   feeding supplement (PROSource TF20)  60 mL Per Tube Daily   fentaNYL   1 patch Transdermal Q72H   FLUoxetine   40 mg Per Tube Daily   free water   100 mL Per Tube 5 X Daily   gabapentin   100 mg Per Tube BID   insulin  aspart  0-5 Units Subcutaneous QHS   insulin  aspart  0-9 Units Subcutaneous TID WC   insulin  aspart  4 Units Subcutaneous TID WC   insulin  glargine  20 Units Subcutaneous BID   levETIRAcetam   500 mg Per Tube BID    lidocaine   1 patch Transdermal Daily   nystatin   100,000 Units Topical TID   mouth rinse  15 mL Mouth Rinse 4 times per day   oxyCODONE   5 mg Per Tube BID   polyethylene glycol  17 g Per Tube Daily   sodium chloride  flush  10-40 mL Intracatheter Q12H   terbinafine   1 Application Topical BID   valACYclovir   1,000 mg Oral TID   Continuous Infusions: PRN Meds:.acetaminophen , alum & mag hydroxide-simeth, artificial tears, glucagon  (human recombinant), guaiFENesin , hydrALAZINE , ipratropium-albuterol , LORazepam , methylphenidate , metoprolol  tartrate, naLOXone  (NARCAN )  injection, ondansetron  (ZOFRAN ) IV, ondansetron  **OR** [DISCONTINUED] ondansetron  (ZOFRAN ) IV, mouth rinse, oxyCODONE  **OR** oxyCODONE , prochlorperazine , senna-docusate, sodium chloride  flush, traZODone   HPI: Brenda [name] is a 64 y.o. female with history of metastatic endometrial cancer, chronic pain diabetes mellitus coronary disease history of stroke DVT PE who is admitted to [facility] with initial concern for urinary tract infection who then was readmitted with worsening mental status.  She was ultimately transferred to [facility] where EEG showed evidence of seizures and she was on Keppra   She has undergone extensive workup by and been seen by neurology and oncology has had Plex x 5 days DIF completed in December she had high-dose steroids and is now on Keppra   's are  being made to transfer to [facility] at present.  Yesterday a rash on her back was noted that had some vesicular components to it as well as a rash on her neck there is a concern for potential varicella and this highly immunocompromised patient.  I recommended placing the patient in airborne contact precautions.  I also recommended roofing lesions to send for VZV and HCV SV PCR's.  The patient is also been started on Valtrex  Cathlyn was able to successfully unroofed 2 vesicles and sent for VZV PCR and HSV PCR.  Continue valtrex , and follow-up on  PCR's and status of lesions.  If this is VZV she is to be on airborne contact precautions until all of the lesions have scabbed over.    I personally spent a total of 81 minutes in the care of the patient today including preparing to see the patient, getting/reviewing separately obtained history, placing orders, referring and communicating with other health care professionals, documenting clinical information in the EHR, independently interpreting results, and coordinating care.   Evaluation of the patient requires complex antimicrobial therapy evaluation, counseling , isolation needs to reduce disease transmission and risk assessment and mitigation.     Review of Systems: Review of Systems  Unable to perform ROS: Medical condition    Past Medical History:  Diagnosis Date   Anemia    Arthritis    back, hands, hips (02/22/2015)   CAD (coronary artery disease)    a. complex LAD/diagonal bifurcation PCI in 2010. b. STEMI 10/2019 s/p PTCA/DES x1 to mLAD overlapping the old stent, residual disease treated medicaly.   Cancer [facility])    uterine   CHF (congestive heart failure) (HCC)    Depression    Diabetes mellitus (HCC)    started when I was pregnant; not sure if it was type 1 or type 2    History of radiation therapy    Endometrium- HDR 01/22/22-03/07/22- Dr. Lynwood Nasuti   Hypercholesterolemia    Hypertension    Morbid obesity (HCC)    Myocardial infarction (HCC)    mild x 3   Neuromuscular disorder (HCC)    neuropathy feet   Sleep apnea    cpap/    Social History[1]  Family History  Problem Relation Age of Onset   Coronary artery disease Mother        in her mid 88s   Hypertension Mother    Cancer Mother        skin   Heart attack Father        in his mid 5s   COPD Father    Stroke Sister    Coronary artery disease Sister    Diabetes Sister    Other Half-Sister    Thyroid  cancer Daughter    Prostate cancer Neg Hx    Colon cancer Neg Hx    Breast cancer  Neg Hx    Endometrial cancer Neg Hx    Ovarian cancer Neg Hx    Pancreatic cancer Neg Hx    Allergies[2]  OBJECTIVE: Blood pressure (!) 184/65, pulse (!) 104, temperature 99.2 F (37.3 C), temperature source Oral, resp. rate 16, height 5' 2 (1.575 m), weight 85.7 kg, last menstrual period 10/30/2014, SpO2 100%.  Physical Exam Constitutional:      General: She is not in acute distress.    Appearance: Normal appearance. She is well-developed. She is not ill-appearing or diaphoretic.  HENT:     Head: Normocephalic and atraumatic.     Right Ear:  Hearing and external ear normal.     Left Ear: Hearing and external ear normal.     Nose: No nasal deformity or rhinorrhea.  Eyes:     General: No scleral icterus.    Conjunctiva/sclera: Conjunctivae normal.     Right eye: Right conjunctiva is not injected.     Left eye: Left conjunctiva is not injected.     Pupils: Pupils are equal, round, and reactive to light.  Neck:     Vascular: No JVD.  Cardiovascular:     Rate and Rhythm: Normal rate and regular rhythm.     Heart sounds: S1 normal and S2 normal.  Pulmonary:     Effort: No respiratory distress.     Breath sounds: No wheezing.  Abdominal:     General: Bowel sounds are normal. There is no distension.     Palpations: Abdomen is soft.     Tenderness: There is no abdominal tenderness.  Musculoskeletal:        General: Normal range of motion.     Right shoulder: Normal.     Left shoulder: Normal.     Cervical back: Normal range of motion and neck supple.     Right hip: Normal.     Left hip: Normal.     Right knee: Normal.     Left knee: Normal.  Lymphadenopathy:     Head:     Right side of head: No submandibular, preauricular or posterior auricular adenopathy.     Left side of head: No submandibular, preauricular or posterior auricular adenopathy.     Cervical: No cervical adenopathy.     Right cervical: No superficial or deep cervical adenopathy.    Left cervical: No  superficial or deep cervical adenopathy.  Skin:    General: Skin is warm and dry.     Coloration: Skin is not pale.     Findings: Rash present. No abrasion, bruising, ecchymosis, erythema or lesion.     Nails: There is no clubbing.  Neurological:     Mental Status: She is alert.     Sensory: No sensory deficit.     Coordination: Coordination normal.     Gait: Gait normal.  Psychiatric:        Attention and Perception: She is attentive.        Speech: Speech normal.        Behavior: Behavior is cooperative.        Cognition and Memory: Cognition is impaired. Memory is impaired. She exhibits impaired remote memory. She does not exhibit impaired recent memory.    Rash on back as seen today    Rash on scalp      Lab Results Lab Results  Component Value Date   WBC 7.3 09/16/2024   HGB 10.8 (L) 09/16/2024   HCT 32.7 (L) 09/16/2024   MCV 92.1 09/16/2024   PLT 224 09/16/2024    Lab Results  Component Value Date   CREATININE 0.33 (L) 09/17/2024   BUN 13 09/17/2024   NA 136 09/17/2024   K 4.1 09/17/2024   CL 99 09/17/2024   CO2 32 09/17/2024    Lab Results  Component Value Date   ALT 14 09/09/2024   AST 15 09/09/2024   ALKPHOS 91 09/09/2024   BILITOT 0.4 09/09/2024     Microbiology: No results found for this or any previous visit (from the past 240 hours).  Jomarie Fleeta Rothman, MD [facility] for Infectious Disease [facility] [phone] pager  09/19/2024, 3:24 PM      [1]  Social History Tobacco Use   Smoking status: Former    Current packs/day: 0.00    Average packs/day: 0.5 packs/day for 1 year (0.5 ttl pk-yrs)    Types: Cigarettes, Cigars    Start date: 09/03/1985    Quit date: 09/03/1986    Years since quitting: 38.0   Smokeless tobacco: Never  Vaping Use   Vaping status: Never Used  Substance Use Topics   Alcohol use: No   Drug use: No  [2]  Allergies Allergen Reactions   Hydrocodone Shortness Of Breath, Dermatitis and Other  (See Comments)    I forget to breathe.   Clopidogrel Hives and Itching   "

## 2024-09-19 NOTE — Progress Notes (Addendum)
 " PROGRESS NOTE    Brenda [name]  FMW:[id number] DOB: [birth year] DOA: 07/15/2024 PCP: [facility]    Brief Narrative:  64 year old with history of metastatic endometrial cancer, chronic pain, DM 2, HTN, HLD, OSA, CAD, CHF, CVA, DVT/PE on Lovenox , chronic hypoxia on 2 L nasal cannula was recently hospitalized at [facility] on 11/9 - 11/11 for UTI treated with antibiotics discharged on p.o. presented back again to the hospital due to worsening mental status.  Eventually transferred to [facility] and EEG showed concerns of seizures therefore started on Keppra .  Also received empiric antibiotics for concerns of meningitis/encephalitis but CSF panel was negative therefore discontinued.  Assessment & Plan:  Acute metabolic encephalopathy Seizures Patient undergone extensive workup.  Seen by neurology and oncology.  Overall infectious etiologies were ruled out therefore antibiotics were eventually discontinued.  MRI brain showed concerns of evolving infarct.  There was also concerns of neurotoxicity secondary to Keytruda  therefore was discontinued. -Status post PLEX x 5 days completed Dec 12/28 -Status post high-dose steroids 12/9 - 12/11 -Now on Keppra  500 mg twice daily - Repeat EEG unremarkable - Seen by neurology again, considering IVIG  Attempted to transfer her to [facility] on 1/16 for second opinion, rejected.  Called [facility] today, they are uptodate that ptn needs neg pressure airborne room. They have also updated their providers. Remains on transfer list.   Left scalp ring lesion Vesicular rash around left back/axilla Concerns of tinea corporis therefore started terbinafine  topical for 2 weeks, EOT 1/29 - Discussed with ID regarding possible disseminated VZV therefore empirically started antiviral.  ID consulted Anxiety -Improved after starting BuSpar  5 mg p.o. twice daily - Continue Ativan  1 mg IV every 4 hours as needed    Metastatic Endometrial  Cancer Cancer Related Pain  Seen by oncology and palliative care. She had had multiple imagings done during this hospitalization.  Per oncology note, there is no clear evidence of progression of the metastatic carcinoma. Keytruda  on hold as explained above. Pain regimen --- Scheduled: Oxycodone  5 mg every 8 hours. Reduced to BID today. --- PRN: IV Dilaudid , oxycodone , Tylenol     Recent DVT/PE,Splenic Infarct In October 2025, patient was diagnosed to have right leg DVT, PE as well as a splenic infarct and she was started on anticoagulation.  This hospitalization, anticoagulation was held due to psoas hematoma and GI bleed.   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   Patient had BRBPR during this hospitalization BRBPR resolved at this time.  CT GI bleed negative.  Required PRBC transfusion during hospitalization.  Therapeutic Lovenox  changed to prophylaxis to Lovenox    Recurrent UTI, Klebsiella Completed 5 days of IV Rocephin  1/10   Dysphagia Moderate malnutrition -Placed PEG tube by IR on 1/6   Type 2 diabetes mellitus A1c 7.2.  On long-acting and sliding scale.  Adjust as necessary    Hypertension IV as needed     Depression/anxiety Prozac  discontinued due to rigidity -Continue Ativan  as needed   H/o CAD, Stroke HLD Currently on Lipitor  80 mg daily.  Anticoagulation on hold as discussed above   Abnormal Thyroid  Function Tests Initially low TSH and elevated free T4, was started on propranolol .  Repeat levels have normalized   Vaginal discharge, persist Received a dose of Diflucan  1/12, will give 1 additional dose of Diflucan    Hypomagnesemia As needed repletion  DVT prophylaxis: Lovenox     Code Status: Limited: Do not attempt resuscitation (DNR) -DNR-LIMITED -Do Not Intubate/DNI  Family Communication:   Status is: Inpatient We are still  pending transfer to [facility]   PT Follow up Recs: Skilled Nursing-Short Term Rehab (<3 Hours/Day)09/09/2024 1500  Subjective: Patient seen at bedside this morning she is resting comfortably.  Also spoke with the daughter who was getting ready to leave.  No complaints at this time  Examination:  General exam: Appears calm and comfortable  Respiratory system: Clear to auscultation. Respiratory effort normal. Cardiovascular system: S1 & S2 heard, RRR. No JVD, murmurs, rubs, gallops or clicks. No pedal edema. Gastrointestinal system: Abdomen is nondistended, soft and nontender. No organomegaly or masses felt. Normal bowel sounds heard. Central nervous system: Difficult to assess Extremities: Symmetric 4 x 5 power. Skin: Left scalp ring lesion Psychiatry: Judgement and insight appear poor PEG tube Right-sided chest wall Chemo-Port Foley catheter           Wound 07/25/24 1100 Pressure Injury Sacrum Medial Deep Tissue Pressure Injury - Purple or maroon localized area of discolored intact skin or blood-filled blister due to damage of underlying soft tissue from pressure and/or shear. (Active)     Wound 08/11/24 0415 Pressure Injury Vertebral column Medial Stage 2 -  Partial thickness loss of dermis presenting as a shallow open injury with a red, pink wound bed without slough. (Active)     Wound 08/31/24 2100 Pressure Injury Thigh Left;Posterior Stage 1 -  Intact skin with non-blanchable redness of a localized area usually over a bony prominence. (Active)     Diet Orders (From admission, onward)     Start     Ordered   09/09/24 0808  Diet regular Room service appropriate? Yes; Fluid consistency: Thin  Diet effective now       Question Answer Comment  Room service appropriate? Yes   Fluid consistency: Thin      09/09/24 0807            Objective: Vitals:   09/18/24 2121 09/19/24 0021 09/19/24 0255 09/19/24 1235  BP: (!) 140/74 (!) 146/80 (!) 151/65 (!) 152/92  Pulse: 87   90 100  Resp: 18  20 20   Temp: 100.1 F (37.8 C)  99.2 F (37.3 C) 99.3 F (37.4 C)  TempSrc: Oral  Oral Axillary  SpO2: 100%  100% 99%  Weight:   85.7 kg   Height:        Intake/Output Summary (Last 24 hours) at 09/19/2024 1402 Last data filed at 09/19/2024 1058 Gross per 24 hour  Intake 400 ml  Output 800 ml  Net -400 ml   Filed Weights   09/07/24 0430 09/08/24 0500 09/19/24 0255  Weight: 87.1 kg 84.5 kg 85.7 kg    Scheduled Meds:  atorvastatin   80 mg Per Tube QHS   busPIRone   5 mg Oral BID   Chlorhexidine  Gluconate Cloth  6 each Topical Daily   enoxaparin  (LOVENOX ) injection  40 mg Subcutaneous Q24H   famotidine   20 mg Per Tube BID   feeding supplement (GLUCERNA 1.5 CAL)  237 mL Per Tube TID WC & HS   feeding supplement (PROSource TF20)  60 mL Per Tube Daily   fentaNYL   1 patch Transdermal Q72H   FLUoxetine   40 mg Per Tube Daily   free water   100 mL Per Tube 5 X Daily   gabapentin   100 mg Per Tube BID   insulin  aspart  0-5 Units Subcutaneous QHS  insulin  aspart  0-9 Units Subcutaneous TID WC   insulin  aspart  4 Units Subcutaneous TID WC   insulin  glargine  20 Units Subcutaneous BID   levETIRAcetam   500 mg Per Tube BID   lidocaine   1 patch Transdermal Daily   nystatin   100,000 Units Topical TID   mouth rinse  15 mL Mouth Rinse 4 times per day   oxyCODONE   5 mg Per Tube BID   polyethylene glycol  17 g Per Tube Daily   sodium chloride  flush  10-40 mL Intracatheter Q12H   terbinafine   1 Application Topical BID   valACYclovir   1,000 mg Oral TID   Continuous Infusions:  Nutritional status Signs/Symptoms: percent weight loss, mild muscle depletion, moderate muscle depletion (12% x 6 months) Percent weight loss: 12 % Interventions: Prostat, Tube feeding, MVI Body mass index is 34.57 kg/m.  Data Reviewed:   CBC: Recent Labs  Lab 09/15/24 0533 09/16/24 0504  WBC 6.8 7.3  HGB 9.8* 10.8*  HCT 30.5* 32.7*  MCV 94.7 92.1  PLT 230 224   Basic Metabolic  Panel: Recent Labs  Lab 09/15/24 0533 09/17/24 0500  NA 139 136  K 4.1 4.1  CL 100 99  CO2 33* 32  GLUCOSE 78 204*  BUN 14 13  CREATININE <0.30* 0.33*  CALCIUM  9.1 9.1  MG 1.6* 1.8  PHOS 3.9 3.5   GFR: Estimated Creatinine Clearance: 75 mL/min (A) (by C-G formula based on SCr of 0.33 mg/dL (L)). Liver Function Tests: No results for input(s): AST, ALT, ALKPHOS, BILITOT, PROT, ALBUMIN  in the last 168 hours. No results for input(s): LIPASE, AMYLASE in the last 168 hours. No results for input(s): AMMONIA in the last 168 hours. Coagulation Profile: No results for input(s): INR, PROTIME in the last 168 hours. Cardiac Enzymes: No results for input(s): CKTOTAL, CKMB, CKMBINDEX, TROPONINI in the last 168 hours. BNP (last 3 results) Recent Labs    05/22/24 0630  PROBNP 3,514.0*   HbA1C: No results for input(s): HGBA1C in the last 72 hours. CBG: Recent Labs  Lab 09/18/24 1143 09/18/24 1635 09/18/24 2121 09/19/24 0737 09/19/24 1234  GLUCAP 177* 348* 357* 238* 198*   Lipid Profile: No results for input(s): CHOL, HDL, LDLCALC, TRIG, CHOLHDL, LDLDIRECT in the last 72 hours. Thyroid  Function Tests: No results for input(s): TSH, T4TOTAL, FREET4, T3FREE, THYROIDAB in the last 72 hours. Anemia Panel: No results for input(s): VITAMINB12, FOLATE, FERRITIN, TIBC, IRON , RETICCTPCT in the last 72 hours. Sepsis Labs: No results for input(s): PROCALCITON, LATICACIDVEN in the last 168 hours.  No results found for this or any previous visit (from the past 240 hours).       Radiology Studies: EEG adult Result Date: 09/17/2024 Shelton Arlin KIDD, MD     09/17/2024  2:49 PM Patient Name: Mahsa Hanser MRN: [id number] Epilepsy Attending: Arlin KIDD Shelton Referring Physician/Provider: Caleen Burgess BROCKS, MD Date: 09/17/2024 Duration: 22.31 mins Patient history: 64yo F with ams. EEG to evaluate for seizure Level of  alertness: Awake AEDs during EEG study: LEV, GBP Technical aspects: This EEG study was done with scalp electrodes positioned according to the 10-20 International system of electrode placement. Electrical activity was reviewed with band pass filter of 1-70Hz , sensitivity of 7 uV/mm, display speed of 10mm/sec with a 60Hz  notched filter applied as appropriate. EEG data were recorded continuously and digitally stored.  Video monitoring was available and reviewed as appropriate. Description: The posterior dominant rhythm consists of 7Hz  activity of moderate voltage (25-35 uV) seen predominantly  in posterior head regions, symmetric and reactive to eye opening and eye closing. EEG showed continuous generalized 3 to 7 Hz theta-delta slowing. Hyperventilation and photic stimulation were not performed.   ABNORMALITY - Continuous slow, generalized IMPRESSION: This study is suggestive of generalized cerebral dysfunction ( encephalopathy). No seizures or epileptiform discharges were seen throughout the recording. Priyanka MALVA Krebs           LOS: 64 days   Time spent= 35 mins    Burgess JAYSON Dare, MD [facility]  If 7PM-7AM, please contact night-coverage  09/19/2024, 2:02 PM  "

## 2024-09-19 NOTE — Progress Notes (Signed)
 Received transfer patient form [facility], patient vitals taken, chg bath completed, CCMD notified. Patient with husband at bedside has no needs at this time. Explained staff with round on patient for needs and repositioning.

## 2024-09-20 DIAGNOSIS — G9341 Metabolic encephalopathy: Secondary | ICD-10-CM | POA: Diagnosis not present

## 2024-09-20 LAB — GLUCOSE, CAPILLARY
Glucose-Capillary: 326 mg/dL — ABNORMAL HIGH (ref 70–99)
Glucose-Capillary: 376 mg/dL — ABNORMAL HIGH (ref 70–99)
Glucose-Capillary: 365 mg/dL — ABNORMAL HIGH (ref 70–99)
Glucose-Capillary: 305 mg/dL — ABNORMAL HIGH (ref 70–99)

## 2024-09-20 LAB — HSV 1/2 PCR (SURFACE)
HSV-1 DNA: NOT DETECTED
HSV-2 DNA: NOT DETECTED

## 2024-09-20 MED ORDER — INSULIN GLARGINE 100 UNIT/ML ~~LOC~~ SOLN
25.0000 [IU] | Freq: Two times a day (BID) | SUBCUTANEOUS | Status: DC
  Administered 2024-09-20 – 2024-09-21 (×4): 25 [IU] via SUBCUTANEOUS
  Filled 2024-09-20 (×6): qty 0.25

## 2024-09-20 NOTE — Progress Notes (Signed)
 " PROGRESS NOTE    Brenda [name]  FMW:[id number] DOB: [birth year] DOA: 07/15/2024 PCP: [facility]    Brief Narrative:  64 year old with history of metastatic endometrial cancer, chronic pain, DM 2, HTN, HLD, OSA, CAD, CHF, CVA, DVT/PE on Lovenox , chronic hypoxia on 2 L nasal cannula was recently hospitalized at [facility] on 11/9 - 11/11 for UTI treated with antibiotics discharged on p.o. presented back again to the hospital due to worsening mental status.  Eventually transferred to [facility] and EEG showed concerns of seizures therefore started on Keppra .  Also received empiric antibiotics for concerns of meningitis/encephalitis but CSF panel was negative therefore discontinued.  Assessment & Plan:  Acute metabolic encephalopathy Seizures Patient undergone extensive workup.  Seen by neurology and oncology.  Overall infectious etiologies were ruled out therefore antibiotics were eventually discontinued.  MRI brain showed concerns of evolving infarct.  There was also concerns of neurotoxicity secondary to Keytruda  therefore was discontinued. -Status post PLEX x 5 days completed Dec 12/28 -Status post high-dose steroids 12/9 - 12/11 -Now on Keppra  500 mg twice daily - Repeat EEG unremarkable - Seen by neurology again, considering IVIG  Attempted to transfer her to [facility] on 1/16 for second opinion, declined. Pending transfer to [facility]: Aware patient needs airborne precaution. Accepting Provider Dr Reta.   Left scalp ring lesion Vesicular rash around left back/axilla Concerns of tinea corporis therefore started terbinafine  topical for 2 weeks, EOT 1/29 - Seen by ID, sent off HSV/VZV PCR.  Continue Valtrex .  Anxiety -Improved after starting BuSpar  5 mg p.o. twice daily - Continue Ativan  1 mg IV every 4 hours as needed    Metastatic Endometrial Cancer Cancer Related Pain  Seen by oncology and palliative care. She had had multiple imagings done  during this hospitalization.  Per oncology note, there is no clear evidence of progression of the metastatic carcinoma. Keytruda  on hold as explained above. Pain regimen --- Scheduled: Oxycodone  5 mg every 8 hours. Reduced to BID today. --- PRN: IV Dilaudid , oxycodone , Tylenol     Recent DVT/PE,Splenic Infarct In October 2025, patient was diagnosed to have right leg DVT, PE as well as a splenic infarct and she was started on anticoagulation.  This hospitalization, anticoagulation was held due to psoas hematoma and GI bleed.   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   Patient had BRBPR during this hospitalization BRBPR resolved at this time.  CT GI bleed negative.  Required PRBC transfusion during hospitalization.  Therapeutic Lovenox  changed to prophylaxis to Lovenox    Recurrent UTI, Klebsiella Completed 5 days of IV Rocephin  1/10   Dysphagia Moderate malnutrition -Placed PEG tube by IR on 1/6   Type 2 diabetes mellitus A1c 7.2.  On long-acting and sliding scale.  Adjust as necessary    Hypertension IV as needed     Depression/anxiety Prozac  discontinued due to rigidity -Continue Ativan  as needed   H/o CAD, Stroke HLD Currently on Lipitor  80 mg daily.  Anticoagulation on hold as discussed above   Abnormal Thyroid  Function Tests Initially low TSH and elevated free T4, was started on propranolol .  Repeat levels have normalized   Vaginal discharge, persist Received a dose of Diflucan  1/12, will give 1 additional dose of Diflucan    Hypomagnesemia As needed repletion   DVT prophylaxis: Lovenox     Code Status: Limited: Do not attempt  resuscitation (DNR) -DNR-LIMITED -Do Not Intubate/DNI  Family Communication:   Status is: Inpatient We are still pending transfer to [facility]   PT Follow up Recs: Skilled Nursing-Short Term Rehab (<3  Hours/Day)09/09/2024 1500  Subjective: Daughter is present at bedside Patient is slightly more awake this morning and answers very basic questions.  Denies any pain other complaints  Examination:  General exam: Appears calm and comfortable  Respiratory system: Clear to auscultation. Respiratory effort normal. Cardiovascular system: S1 & S2 heard, RRR. No JVD, murmurs, rubs, gallops or clicks. No pedal edema. Gastrointestinal system: Abdomen is nondistended, soft and nontender. No organomegaly or masses felt. Normal bowel sounds heard. Central nervous system: Alert to name Extremities: Symmetric 4 x 5 power. Skin: Left scalp ring lesion Psychiatry: Judgement and insight appear poor PEG tube Right-sided chest wall Chemo-Port Foley catheter           Wound 07/25/24 1100 Pressure Injury Sacrum Medial Deep Tissue Pressure Injury - Purple or maroon localized area of discolored intact skin or blood-filled blister due to damage of underlying soft tissue from pressure and/or shear. (Active)     Wound 08/11/24 0415 Pressure Injury Vertebral column Medial Stage 2 -  Partial thickness loss of dermis presenting as a shallow open injury with a red, pink wound bed without slough. (Active)     Wound 08/31/24 2100 Pressure Injury Thigh Left;Posterior Stage 1 -  Intact skin with non-blanchable redness of a localized area usually over a bony prominence. (Active)     Diet Orders (From admission, onward)     Start     Ordered   09/09/24 0808  Diet regular Room service appropriate? Yes; Fluid consistency: Thin  Diet effective now       Question Answer Comment  Room service appropriate? Yes   Fluid consistency: Thin      09/09/24 0807            Objective: Vitals:   09/19/24 1948 09/19/24 2352 09/20/24 0410 09/20/24 0846  BP: (!) 156/63 (!) 152/59 (!) 142/69 (!) 165/70  Pulse: 98 (!) 102 92 85  Resp: 12 18 18 19   Temp: 99.4 F (37.4 C) 99.2 F (37.3 C) 99 F (37.2 C) 98.6 F (37  C)  TempSrc: Oral Axillary Axillary Oral  SpO2: 100% 100% 98% 100%  Weight:      Height:        Intake/Output Summary (Last 24 hours) at 09/20/2024 1030 Last data filed at 09/20/2024 0800 Gross per 24 hour  Intake 1200 ml  Output 1200 ml  Net 0 ml   Filed Weights   09/07/24 0430 09/08/24 0500 09/19/24 0255  Weight: 87.1 kg 84.5 kg 85.7 kg    Scheduled Meds:  atorvastatin   80 mg Per Tube QHS   busPIRone   5 mg Oral BID   Chlorhexidine  Gluconate Cloth  6 each Topical Daily   enoxaparin  (LOVENOX ) injection  40 mg Subcutaneous Q24H   famotidine   20 mg Per Tube BID   feeding supplement (GLUCERNA 1.5 CAL)  237 mL Per Tube TID WC & HS   feeding supplement (PROSource TF20)  60 mL Per Tube Daily   fentaNYL   1 patch Transdermal Q72H   FLUoxetine   40 mg Per Tube Daily   free water   100 mL Per Tube 5 X Daily   gabapentin   100 mg Per Tube BID   insulin  aspart  0-5 Units Subcutaneous QHS   insulin  aspart  0-9 Units Subcutaneous TID WC   insulin  aspart  4 Units Subcutaneous TID WC   insulin  glargine  25 Units Subcutaneous BID   levETIRAcetam   500 mg Per Tube BID   lidocaine   1 patch Transdermal Daily   nystatin   100,000 Units Topical TID   mouth rinse  15 mL Mouth Rinse 4 times per day   oxyCODONE   5 mg Per Tube BID   polyethylene glycol  17 g Per Tube Daily   sodium chloride  flush  10-40 mL Intracatheter Q12H   terbinafine   1 Application Topical BID   valACYclovir   1,000 mg Oral TID   Continuous Infusions:  Nutritional status Signs/Symptoms: percent weight loss, mild muscle depletion, moderate muscle depletion (12% x 6 months) Percent weight loss: 12 % Interventions: Prostat, Tube feeding, MVI Body mass index is 34.57 kg/m.  Data Reviewed:   CBC: Recent Labs  Lab 09/15/24 0533 09/16/24 0504  WBC 6.8 7.3  HGB 9.8* 10.8*  HCT 30.5* 32.7*  MCV 94.7 92.1  PLT 230 224   Basic Metabolic Panel: Recent Labs  Lab 09/15/24 0533 09/17/24 0500  NA 139 136  K 4.1 4.1  CL  100 99  CO2 33* 32  GLUCOSE 78 204*  BUN 14 13  CREATININE <0.30* 0.33*  CALCIUM  9.1 9.1  MG 1.6* 1.8  PHOS 3.9 3.5   GFR: Estimated Creatinine Clearance: 75 mL/min (A) (by C-G formula based on SCr of 0.33 mg/dL (L)). Liver Function Tests: No results for input(s): AST, ALT, ALKPHOS, BILITOT, PROT, ALBUMIN  in the last 168 hours. No results for input(s): LIPASE, AMYLASE in the last 168 hours. No results for input(s): AMMONIA in the last 168 hours. Coagulation Profile: No results for input(s): INR, PROTIME in the last 168 hours. Cardiac Enzymes: No results for input(s): CKTOTAL, CKMB, CKMBINDEX, TROPONINI in the last 168 hours. BNP (last 3 results) Recent Labs    05/22/24 0630  PROBNP 3,514.0*   HbA1C: No results for input(s): HGBA1C in the last 72 hours. CBG: Recent Labs  Lab 09/18/24 2121 09/19/24 0737 09/19/24 1234 09/19/24 1755 09/20/24 0908  GLUCAP 357* 238* 198* 378* 326*   Lipid Profile: No results for input(s): CHOL, HDL, LDLCALC, TRIG, CHOLHDL, LDLDIRECT in the last 72 hours. Thyroid  Function Tests: No results for input(s): TSH, T4TOTAL, FREET4, T3FREE, THYROIDAB in the last 72 hours. Anemia Panel: No results for input(s): VITAMINB12, FOLATE, FERRITIN, TIBC, IRON , RETICCTPCT in the last 72 hours. Sepsis Labs: No results for input(s): PROCALCITON, LATICACIDVEN in the last 168 hours.  No results found for this or any previous visit (from the past 240 hours).       Radiology Studies: No results found.         LOS: 65 days   Time spent= 35 mins    Burgess JAYSON Dare, MD [facility]  If 7PM-7AM, please contact night-coverage  09/20/2024, 10:30 AM  "

## 2024-09-21 DIAGNOSIS — G9341 Metabolic encephalopathy: Secondary | ICD-10-CM | POA: Diagnosis not present

## 2024-09-21 LAB — BASIC METABOLIC PANEL WITH GFR
Anion gap: 6 (ref 5–15)
BUN: 15 mg/dL (ref 8–23)
CO2: 31 mmol/L (ref 22–32)
Calcium: 9.2 mg/dL (ref 8.9–10.3)
Chloride: 97 mmol/L — ABNORMAL LOW (ref 98–111)
Creatinine, Ser: 0.39 mg/dL — ABNORMAL LOW (ref 0.44–1.00)
GFR, Estimated: >60 mL/min
Glucose, Bld: 245 mg/dL — ABNORMAL HIGH (ref 70–99)
Potassium: 4.1 mmol/L (ref 3.5–5.1)
Sodium: 133 mmol/L — ABNORMAL LOW (ref 135–145)

## 2024-09-21 LAB — GLUCOSE, CAPILLARY
Glucose-Capillary: 335 mg/dL — ABNORMAL HIGH (ref 70–99)
Glucose-Capillary: 276 mg/dL — ABNORMAL HIGH (ref 70–99)
Glucose-Capillary: 199 mg/dL — ABNORMAL HIGH (ref 70–99)
Glucose-Capillary: 157 mg/dL — ABNORMAL HIGH (ref 70–99)
Glucose-Capillary: 289 mg/dL — ABNORMAL HIGH (ref 70–99)
Glucose-Capillary: 315 mg/dL — ABNORMAL HIGH (ref 70–99)
Glucose-Capillary: 276 mg/dL — ABNORMAL HIGH (ref 70–99)

## 2024-09-21 LAB — VARICELLA-ZOSTER BY PCR: Varicella-Zoster, PCR: POSITIVE — AB

## 2024-09-21 LAB — MAGNESIUM: Magnesium: 1.7 mg/dL (ref 1.7–2.4)

## 2024-09-21 NOTE — Progress Notes (Signed)
 " PROGRESS NOTE    Brenda [name]  FMW:[id number] DOB: 1960/09/30 DOA: 07/15/2024 PCP: [facility]    Brief Narrative:  64 year old with history of metastatic endometrial cancer, chronic pain, DM 2, HTN, HLD, OSA, CAD, CHF, CVA, DVT/PE on Lovenox , chronic hypoxia on 2 L nasal cannula was recently hospitalized at [facility] on 11/9 - 11/11 for UTI treated with antibiotics discharged on p.o. presented back again to the hospital due to worsening mental status.  Eventually transferred to [facility] and EEG showed concerns of seizures therefore started on Keppra .  Also received empiric antibiotics for concerns of meningitis/encephalitis but CSF panel was negative therefore discontinued.  Patient underwent high-dose steroids earlier in the summer thereafter plasmapheresis around Christmas time.  Due to worsening catatonia/stiffness, repeat EEG performed which was unremarkable.  Neurology was reconsulted who recommended possibly having second opinion from tertiary care center.  Patient currently on wait list at [facility].  While awaiting, noted to have vesicular rash concerning for VZV which was positive therefore started on valacyclovir .  Seen by infectious disease.  Assessment & Plan:  Acute metabolic encephalopathy Seizures Patient undergone extensive workup.  Seen by neurology and oncology.  Overall infectious etiologies were ruled out therefore antibiotics were eventually discontinued.  MRI brain showed concerns of evolving infarct.  There was also concerns of neurotoxicity secondary to Keytruda  therefore was discontinued. -Status post PLEX x 5 days completed Dec 12/28 -Status post high-dose steroids 12/9 - 12/11 -Now on Keppra  500 mg twice daily - Repeat EEG unremarkable - Seen by neurology again, considering IVIG  Attempted to transfer her to [facility] on 1/16 for second opinion, declined. Pending transfer to [facility]: Aware patient needs airborne precaution. Accepting  Provider Dr Reta.   Left scalp ring lesion Vesicular rash around left back/axilla VZV infection Concerns of tinea corporis therefore started terbinafine  topical for 2 weeks, EOT 1/29 -Seen by ID.  HSV PCR negative, VZV positive.  Continue valacyclovir , if needed will transition to IV acyclovir - Continue airborne precaution until all lesions have scabbed over.  Anxiety; stable -Improved after starting BuSpar  5 mg p.o. twice daily - Continue Ativan  1 mg IV every 4 hours as needed    Metastatic Endometrial Cancer Cancer Related Pain  Seen by oncology and palliative care. She had had multiple imagings done during this hospitalization.  Per oncology note, there is no clear evidence of progression of the metastatic carcinoma. Keytruda  on hold as explained above. Pain regimen --- Scheduled: Oxycodone  5 mg every 8 hours. Reduced to BID today. --- PRN: IV Dilaudid , oxycodone , Tylenol     Recent DVT/PE,Splenic Infarct In October 2025, patient was diagnosed to have right leg DVT, PE as well as a splenic infarct and she was started on anticoagulation.  This hospitalization, anticoagulation was held due to psoas hematoma and GI bleed.   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   Patient had BRBPR during this hospitalization BRBPR resolved at this time.  CT GI bleed negative.  Required PRBC transfusion during hospitalization.  Therapeutic Lovenox  changed to prophylaxis to Lovenox    Recurrent UTI, Klebsiella Completed 5 days of IV Rocephin  1/10   Dysphagia Moderate malnutrition -Placed PEG tube by IR on 1/6   Type 2 diabetes mellitus A1c 7.2.  On long-acting and sliding scale.  Adjust as necessary    Hypertension IV as needed  Depression/anxiety Prozac  discontinued due to rigidity -Continue Ativan  as needed   H/o CAD, Stroke HLD Currently on  Lipitor  80 mg daily.  Anticoagulation on hold as discussed above   Abnormal Thyroid  Function Tests Initially low TSH and elevated free T4, was started on propranolol .  Repeat levels have normalized   Vaginal discharge, persist Received a dose of Diflucan  1/12, will give 1 additional dose of Diflucan    Hypomagnesemia As needed repletion   DVT prophylaxis: Lovenox     Code Status: Limited: Do not attempt resuscitation (DNR) -DNR-LIMITED -Do Not Intubate/DNI  Family Communication:   Status is: Inpatient We are still pending transfer to [facility]   PT Follow up Recs: Skilled Nursing-Short Term Rehab (<3 Hours/Day)09/09/2024 1500  Subjective: Awake. Answers basic questions.   Examination:  General exam: Appears calm and comfortable  Respiratory system: Clear to auscultation. Respiratory effort normal. Cardiovascular system: S1 & S2 heard, RRR. No JVD, murmurs, rubs, gallops or clicks. No pedal edema. Gastrointestinal system: Abdomen is nondistended, soft and nontender. No organomegaly or masses felt. Normal bowel sounds heard. Central nervous system: Alert to name Extremities: Symmetric 4 x 5 power. Skin: Left scalp ring lesion Psychiatry: Judgement and insight appear poor PEG tube Right-sided chest wall Chemo-Port Foley catheter           Wound 07/25/24 1100 Pressure Injury Sacrum Medial Deep Tissue Pressure Injury - Purple or maroon localized area of discolored intact skin or blood-filled blister due to damage of underlying soft tissue from pressure and/or shear. (Active)     Wound 08/11/24 0415 Pressure Injury Vertebral column Medial Stage 2 -  Partial thickness loss of dermis presenting as a shallow open injury with a red, pink wound bed without slough. (Active)     Wound 08/31/24 2100 Pressure Injury Thigh Left;Posterior Stage 1 -  Intact skin with non-blanchable redness of a localized area usually over a bony prominence. (Active)     Diet Orders (From admission,  onward)     Start     Ordered   09/09/24 0808  Diet regular Room service appropriate? Yes; Fluid consistency: Thin  Diet effective now       Question Answer Comment  Room service appropriate? Yes   Fluid consistency: Thin      09/09/24 0807            Objective: Vitals:   09/20/24 1629 09/20/24 2024 09/21/24 0014 09/21/24 0825  BP: (!) 184/96 (!) 191/86 (!) 156/79 (!) 146/71  Pulse: 94 94 (!) 101 81  Resp: 20 10 20 16   Temp: 99.5 F (37.5 C) 98.9 F (37.2 C) 99.4 F (37.4 C) 98.8 F (37.1 C)  TempSrc: Axillary Oral Axillary Axillary  SpO2: 98% 100% 91% 100%  Weight:      Height:        Intake/Output Summary (Last 24 hours) at 09/21/2024 0948 Last data filed at 09/21/2024 0931 Gross per 24 hour  Intake 6361.83 ml  Output 2230 ml  Net 4131.83 ml   Filed Weights   09/07/24 0430 09/08/24 0500 09/19/24 0255  Weight: 87.1 kg 84.5 kg 85.7 kg    Scheduled Meds:  atorvastatin   80 mg Per Tube QHS   busPIRone   5 mg Oral BID   Chlorhexidine  Gluconate Cloth  6 each Topical Daily   enoxaparin  (LOVENOX ) injection  40 mg Subcutaneous Q24H   famotidine   20 mg Per Tube BID   feeding supplement (GLUCERNA 1.5 CAL)  237 mL Per Tube TID WC & HS   feeding  supplement (PROSource TF20)  60 mL Per Tube Daily   fentaNYL   1 patch Transdermal Q72H   FLUoxetine   40 mg Per Tube Daily   free water   100 mL Per Tube 5 X Daily   gabapentin   100 mg Per Tube BID   insulin  aspart  0-5 Units Subcutaneous QHS   insulin  aspart  0-9 Units Subcutaneous TID WC   insulin  aspart  4 Units Subcutaneous TID WC   insulin  glargine  25 Units Subcutaneous BID   levETIRAcetam   500 mg Per Tube BID   lidocaine   1 patch Transdermal Daily   nystatin   100,000 Units Topical TID   mouth rinse  15 mL Mouth Rinse 4 times per day   oxyCODONE   5 mg Per Tube BID   polyethylene glycol  17 g Per Tube Daily   sodium chloride  flush  10-40 mL Intracatheter Q12H   terbinafine   1 Application Topical BID   valACYclovir    1,000 mg Oral TID   Continuous Infusions:  Nutritional status Signs/Symptoms: percent weight loss, mild muscle depletion, moderate muscle depletion (12% x 6 months) Percent weight loss: 12 % Interventions: Prostat, Tube feeding, MVI Body mass index is 34.57 kg/m.  Data Reviewed:   CBC: Recent Labs  Lab 09/15/24 0533 09/16/24 0504  WBC 6.8 7.3  HGB 9.8* 10.8*  HCT 30.5* 32.7*  MCV 94.7 92.1  PLT 230 224   Basic Metabolic Panel: Recent Labs  Lab 09/15/24 0533 09/17/24 0500 09/21/24 0440  NA 139 136 133*  K 4.1 4.1 4.1  CL 100 99 97*  CO2 33* 32 31  GLUCOSE 78 204* 245*  BUN 14 13 15   CREATININE <0.30* 0.33* 0.39*  CALCIUM  9.1 9.1 9.2  MG 1.6* 1.8 1.7  PHOS 3.9 3.5  --    GFR: Estimated Creatinine Clearance: 75 mL/min (A) (by C-G formula based on SCr of 0.39 mg/dL (L)). Liver Function Tests: No results for input(s): AST, ALT, ALKPHOS, BILITOT, PROT, ALBUMIN  in the last 168 hours. No results for input(s): LIPASE, AMYLASE in the last 168 hours. No results for input(s): AMMONIA in the last 168 hours. Coagulation Profile: No results for input(s): INR, PROTIME in the last 168 hours. Cardiac Enzymes: No results for input(s): CKTOTAL, CKMB, CKMBINDEX, TROPONINI in the last 168 hours. BNP (last 3 results) Recent Labs    05/22/24 0630  PROBNP 3,514.0*   HbA1C: No results for input(s): HGBA1C in the last 72 hours. CBG: Recent Labs  Lab 09/20/24 1151 09/20/24 1622 09/20/24 2114 09/21/24 0559 09/21/24 0832  GLUCAP 376* 365* 305* 199* 157*   Lipid Profile: No results for input(s): CHOL, HDL, LDLCALC, TRIG, CHOLHDL, LDLDIRECT in the last 72 hours. Thyroid  Function Tests: No results for input(s): TSH, T4TOTAL, FREET4, T3FREE, THYROIDAB in the last 72 hours. Anemia Panel: No results for input(s): VITAMINB12, FOLATE, FERRITIN, TIBC, IRON , RETICCTPCT in the last 72 hours. Sepsis Labs: No results  for input(s): PROCALCITON, LATICACIDVEN in the last 168 hours.  Recent Results (from the past 240 hours)  Varicella-zoster by PCR     Status: Abnormal   Collection Time: 09/19/24 11:18 AM   Specimen: Lesion; Sterile Swab  Result Value Ref Range Status   Varicella-Zoster, PCR Positive (A) Negative Final    Comment: (NOTE) Varicella Zoster Virus DNA detected. This test was developed and its performance characteristics determined by [facility]. It has not been cleared or approved by the Food and Drug Administration. Performed At: [facility] [address] [place], [place] [id number] Nagendra  Frankey MD Ey:[id number]          Radiology Studies: No results found.         LOS: 66 days   Time spent= 35 mins    Burgess JAYSON Dare, MD [facility]  If 7PM-7AM, please contact night-coverage  09/21/2024, 9:48 AM  "

## 2024-09-22 DIAGNOSIS — G9341 Metabolic encephalopathy: Secondary | ICD-10-CM | POA: Diagnosis not present

## 2024-09-22 DIAGNOSIS — L8990 Pressure ulcer of unspecified site, unspecified stage: Secondary | ICD-10-CM | POA: Insufficient documentation

## 2024-09-22 LAB — GLUCOSE, CAPILLARY
Glucose-Capillary: 169 mg/dL — ABNORMAL HIGH (ref 70–99)
Glucose-Capillary: 128 mg/dL — ABNORMAL HIGH (ref 70–99)
Glucose-Capillary: 89 mg/dL (ref 70–99)
Glucose-Capillary: 156 mg/dL — ABNORMAL HIGH (ref 70–99)
Glucose-Capillary: 356 mg/dL — ABNORMAL HIGH (ref 70–99)

## 2024-09-22 LAB — BASIC METABOLIC PANEL WITH GFR
Anion gap: 9 (ref 5–15)
BUN: 16 mg/dL (ref 8–23)
CO2: 30 mmol/L (ref 22–32)
Calcium: 9.7 mg/dL (ref 8.9–10.3)
Chloride: 96 mmol/L — ABNORMAL LOW (ref 98–111)
Creatinine, Ser: 0.42 mg/dL — ABNORMAL LOW (ref 0.44–1.00)
GFR, Estimated: >60 mL/min
Glucose, Bld: 175 mg/dL — ABNORMAL HIGH (ref 70–99)
Potassium: 3.8 mmol/L (ref 3.5–5.1)
Sodium: 136 mmol/L (ref 135–145)

## 2024-09-22 LAB — CBC
HCT: 35.6 % — ABNORMAL LOW (ref 36.0–46.0)
Hemoglobin: 11.8 g/dL — ABNORMAL LOW (ref 12.0–15.0)
MCH: 30.1 pg (ref 26.0–34.0)
MCHC: 33.1 g/dL (ref 30.0–36.0)
MCV: 90.8 fL (ref 80.0–100.0)
Platelets: 239 K/uL (ref 150–400)
RBC: 3.92 MIL/uL (ref 3.87–5.11)
RDW: 15.4 % (ref 11.5–15.5)
WBC: 8.8 K/uL (ref 4.0–10.5)
nRBC: 0 % (ref 0.0–0.2)

## 2024-09-22 LAB — MAGNESIUM: Magnesium: 1.8 mg/dL (ref 1.7–2.4)

## 2024-09-22 LAB — PHOSPHORUS: Phosphorus: 3.8 mg/dL (ref 2.5–4.6)

## 2024-09-22 MED ORDER — OSMOLITE 1.5 CAL PO LIQD
237.0000 mL | Freq: Four times a day (QID) | ORAL | Status: DC
  Administered 2024-09-22 – 2024-09-23 (×4): 237 mL
  Filled 2024-09-22 (×7): qty 237

## 2024-09-22 MED ORDER — INSULIN ASPART 100 UNIT/ML IJ SOLN
7.0000 [IU] | Freq: Three times a day (TID) | INTRAMUSCULAR | Status: DC
  Filled 2024-09-22: qty 1

## 2024-09-22 MED ORDER — INSULIN GLARGINE 100 UNIT/ML ~~LOC~~ SOLN
30.0000 [IU] | Freq: Two times a day (BID) | SUBCUTANEOUS | Status: DC
  Administered 2024-09-22 – 2024-09-23 (×2): 30 [IU] via SUBCUTANEOUS
  Filled 2024-09-22 (×4): qty 0.3

## 2024-09-22 NOTE — Progress Notes (Signed)
 Nutrition Follow-up  DOCUMENTATION CODES:   Non-severe (moderate) malnutrition in context of chronic illness  INTERVENTION:  Continue bolus TF via PEG: Glucerna 1.5 bolus feeds, 4 cartons per day (948 ml per day) Prosource TF20 1x/d Free water : 100 mL 5x/d (50mL before and after each bolus + 1 additional fluid bolus) Provides 1504 kcal, 98 gm protein, 820 ml free water  daily ( fee water  TF+flush)  Can substitute Osmolite 1.5 bolus feeds, 4 cartons daily until Glucerna 1.5 back in stock (provides 1420 kcal, 60g protein and free water  daily)  Continue current diet as ordered, encourage PO intake   NUTRITION DIAGNOSIS:  Moderate Malnutrition related to chronic illness (cancer) as evidenced by percent weight loss, mild muscle depletion, moderate muscle depletion (12% x 6 months). - remains applicable  GOAL:  Patient will meet greater than or equal to 90% of their needs - goal met via TF  MONITOR:  Weight trends, TF tolerance, I & O's, Labs, Diet advancement  REASON FOR ASSESSMENT:  Consult Enteral/tube feeding initiation and management  ASSESSMENT:  Pt with hx of HTN, HLD, CAD, CHF, hx CVA, uterine cancer s/p hysterectomy and adjuvant radiation therapy in 2023 with recurrance with mets in July 2025, hx MI x 3, and DM type 1 presented to [facility] with weakness after a recent admission from 11/9-11/11. Pt had seizures during admission and was transferred to [facility] for continuous EEG.  11/13 - presented to [facility] with chills and weakness after recent admission to [facility] 11/15 - transferred to [facility] 11/19 - cortrak placed (gastric) 12/2 - SLP BSE, NPO 12/4 - MBS, DYS1, thin liquids 12/9 - SLP BSE, regular diet to promote PO intake 12/16 - TF on hold for GI bleed, changed to glucerna 1.5 when able to restart 12/19 - first PLEX treatment (planned for 5) 12/27 - fifth (final) PLEX treatment 1/2 - Family agreed to PEG placement, IR  consulted 1/6 - PEG placement in IR 1/13 - adjusted to bolus feeds  Palliative Care checked in with patient/family today, goal remains for all interventions offered to prolong life.   Patient with increased confusion and stiffness. Neurology suspecting paraneoplastic syndrome. Favor IVIG vs transfer to a tertiary facility. Awaiting transfer to [facility].   Admission c/b VZV infection. Now on airborne precautions.   Patient resting soundly at time of visit. She awakens but does not interact with RD. Family friend from church present at bedside. No current questions or concerns.   Pt with breakfast tray at bedside. NT leaving room at time of visit. Reports that pt did not want to eat and has been sleeping.   Unfortunately, Glucerna 1.5 cartons are out of stock. Substitution ordered (4 cartons Osmolite 1.5 per day) until Glucerna shipment received.   Of note, pt has continued with hyperglycemia secondary to type 1 diabetes. MD adjusted insulin  regimen today. Discussed temporary adjustments to nutrition regimen with MD.   Meal completions: 1/17: 0% breakfast, 30% lunch 1/18: 10% lunch, 10% dinner   Admit weight: 77.9 kg Current weight: 84.5 kg    Medications:  pepcid  BID, SSI 0-9 units TID, novolog  7 units TID, lantus  30 units BID, miralax  daily  Labs:  Chloride 96 Cr 0.42 CBG's 89-315 x24 hours Diet Order:   Diet Order             Diet regular Room service appropriate? Yes; Fluid consistency: Thin  Diet effective now  EDUCATION NEEDS:   Education needs have been addressed  Skin:  Skin Assessment: Reviewed RN Assessment  Last BM:  1/20 type 6 medium  Height:   Ht Readings from Last 1 Encounters:  08/21/24 5' 2 (1.575 m)    Weight:   Wt Readings from Last 1 Encounters:  09/19/24 85.7 kg    Ideal Body Weight:  50 kg  BMI:  Body mass index is 34.57 kg/m.  Estimated Nutritional Needs:   Kcal:  1600-1800 kcal/d  Protein:  75-90  g/d  Fluid:  >/=1.6L/d  Allie Demetra Moya, RDN, LDN Clinical Nutrition See AMiON for contact information.

## 2024-09-22 NOTE — Progress Notes (Signed)
 " PROGRESS NOTE    Brenda [name]  FMW:[id number] DOB: Dec 24, 1960 DOA: 07/15/2024 PCP: [facility]    Brief Narrative:  64 year old with history of metastatic endometrial cancer, chronic pain, DM 2, HTN, HLD, OSA, CAD, CHF, CVA, DVT/PE on Lovenox , chronic hypoxia on 2 L nasal cannula was recently hospitalized at [facility] on 11/9 - 11/11 for UTI treated with antibiotics discharged on p.o. presented back again to the hospital due to worsening mental status.  Eventually transferred to [facility] and EEG showed concerns of seizures therefore started on Keppra .  Also received empiric antibiotics for concerns of meningitis/encephalitis but CSF panel was negative therefore discontinued.  Patient underwent high-dose steroids earlier in the summer thereafter plasmapheresis around Christmas time.  Due to worsening catatonia/stiffness, repeat EEG performed which was unremarkable.  Neurology was reconsulted who recommended possibly having second opinion from tertiary care center.  Patient currently on wait list at [facility].  While awaiting, noted to have vesicular rash concerning for VZV which was positive therefore started on valacyclovir .  Seen by infectious disease.  Assessment & Plan:  Acute metabolic encephalopathy Seizures Patient undergone extensive workup.  Seen by neurology and oncology.  Overall infectious etiologies were ruled out therefore antibiotics were eventually discontinued.  MRI brain showed concerns of evolving infarct.  There was also concerns of neurotoxicity secondary to Keytruda  therefore was discontinued. -Status post PLEX x 5 days completed Dec 12/28 -Status post high-dose steroids 12/9 - 12/11 -Now on Keppra  500 mg twice daily - Repeat EEG unremarkable - Seen by neurology again, considering IVIG -Will repeat MRI brain, talk to neurology Dr. Voncile  Attempted to transfer her to [facility] on 1/16 for second opinion, declined. Pending transfer to  [facility]: Aware patient needs airborne precaution. Accepting Provider Dr Reta.   Left scalp ring lesion Vesicular rash around left back/axilla VZV infection Concerns of tinea corporis therefore started terbinafine  topical for 2 weeks, EOT 1/29 -Seen by ID.  HSV PCR negative, VZV positive.  Continue valacyclovir , if needed will transition to IV acyclovir - Continue airborne precaution until all lesions have scabbed over.  Anxiety; stable -Improved after starting BuSpar  5 mg p.o. twice daily - Continue Ativan  1 mg IV every 4 hours as needed    Metastatic Endometrial Cancer Cancer Related Pain  Seen by oncology and palliative care. She had had multiple imagings done during this hospitalization.  Per oncology note, there is no clear evidence of progression of the metastatic carcinoma. Keytruda  on hold as explained above. Pain regimen --- Scheduled: Oxycodone  5 mg every 8 hours. Reduced to BID today. --- PRN: IV Dilaudid , oxycodone , Tylenol     Recent DVT/PE,Splenic Infarct In October 2025, patient was diagnosed to have right leg DVT, PE as well as a splenic infarct and she was started on anticoagulation.  This hospitalization, anticoagulation was held due to psoas hematoma and GI bleed.   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   Patient had BRBPR during this hospitalization BRBPR resolved at this time.  CT GI bleed negative.  Required PRBC transfusion during hospitalization.  Therapeutic Lovenox  changed to prophylaxis to Lovenox    Recurrent UTI, Klebsiella Completed 5 days of IV Rocephin  1/10   Dysphagia Moderate malnutrition -Placed PEG tube by IR on 1/6   Type 2 diabetes mellitus A1c 7.2.  On long-acting and sliding scale.  Adjust as necessary  Hypertension IV as needed     Depression/anxiety Prozac  discontinued due to rigidity -Continue  Ativan  as needed   H/o CAD, Stroke HLD Currently on Lipitor  80 mg daily.  Anticoagulation on hold as discussed above   Abnormal Thyroid  Function Tests Initially low TSH and elevated free T4, was started on propranolol .  Repeat levels have normalized   Vaginal discharge, persist Received a dose of Diflucan  1/12, give another dose on 1/17   Hypomagnesemia As needed repletion   DVT prophylaxis: Lovenox     Code Status: Limited: Do not attempt resuscitation (DNR) -DNR-LIMITED -Do Not Intubate/DNI  Family Communication:   Status is: Inpatient We are still pending transfer to [facility]   PT Follow up Recs: Skilled Nursing-Short Term Rehab (<3 Hours/Day)09/09/2024 1500  Subjective: Answers very basic questions but slightly drowsy this morning  Examination:  General exam: Appears calm and comfortable  Respiratory system: Clear to auscultation. Respiratory effort normal. Cardiovascular system: S1 & S2 heard, RRR. No JVD, murmurs, rubs, gallops or clicks. No pedal edema. Gastrointestinal system: Abdomen is nondistended, soft and nontender. No organomegaly or masses felt. Normal bowel sounds heard. Central nervous system: Alert to name Extremities: Symmetric 4 x 5 power. Skin: Left scalp ring lesion Psychiatry: Judgement and insight appear poor PEG tube Right-sided chest wall Chemo-Port Foley catheter           Wound 07/25/24 1100 Pressure Injury Sacrum Medial Deep Tissue Pressure Injury - Purple or maroon localized area of discolored intact skin or blood-filled blister due to damage of underlying soft tissue from pressure and/or shear. (Active)     Wound 08/11/24 0415 Pressure Injury Vertebral column Medial Stage 2 -  Partial thickness loss of dermis presenting as a shallow open injury with a red, pink wound bed without slough. (Active)     Wound 08/31/24 2100 Pressure Injury Thigh Left;Posterior Stage 1 -  Intact skin with non-blanchable redness of a localized area usually over  a bony prominence. (Active)     Diet Orders (From admission, onward)     Start     Ordered   09/09/24 0808  Diet regular Room service appropriate? Yes; Fluid consistency: Thin  Diet effective now       Question Answer Comment  Room service appropriate? Yes   Fluid consistency: Thin      09/09/24 0807            Objective: Vitals:   09/21/24 2013 09/22/24 0021 09/22/24 0441 09/22/24 0814  BP: (!) 163/86 (!) 165/103 (!) 153/100 125/69  Pulse: 99 (!) 105 99 76  Resp: 18 15 12 12   Temp: 99.3 F (37.4 C) 99 F (37.2 C) 99.1 F (37.3 C) 98.7 F (37.1 C)  TempSrc: Axillary Axillary Axillary Axillary  SpO2: 96% 99% 100% 100%  Weight:      Height:        Intake/Output Summary (Last 24 hours) at 09/22/2024 1036 Last data filed at 09/22/2024 0905 Gross per 24 hour  Intake 510 ml  Output 800 ml  Net -290 ml   Filed Weights   09/07/24 0430 09/08/24 0500 09/19/24 0255  Weight: 87.1 kg 84.5 kg 85.7 kg    Scheduled Meds:  atorvastatin   80 mg Per Tube QHS   busPIRone   5 mg Oral BID   Chlorhexidine  Gluconate Cloth  6 each Topical Daily   enoxaparin  (LOVENOX ) injection  40 mg Subcutaneous Q24H   famotidine   20 mg Per Tube BID   feeding supplement (GLUCERNA 1.5 CAL)  237 mL  Per Tube TID WC & HS   feeding supplement (PROSource TF20)  60 mL Per Tube Daily   fentaNYL   1 patch Transdermal Q72H   FLUoxetine   40 mg Per Tube Daily   free water   100 mL Per Tube 5 X Daily   gabapentin   100 mg Per Tube BID   insulin  aspart  0-5 Units Subcutaneous QHS   insulin  aspart  0-9 Units Subcutaneous TID WC   insulin  aspart  7 Units Subcutaneous TID WC   insulin  glargine  30 Units Subcutaneous BID   levETIRAcetam   500 mg Per Tube BID   lidocaine   1 patch Transdermal Daily   nystatin   100,000 Units Topical TID   mouth rinse  15 mL Mouth Rinse 4 times per day   oxyCODONE   5 mg Per Tube BID   polyethylene glycol  17 g Per Tube Daily   sodium chloride  flush  10-40 mL Intracatheter Q12H    terbinafine   1 Application Topical BID   valACYclovir   1,000 mg Oral TID   Continuous Infusions:  Nutritional status Signs/Symptoms: percent weight loss, mild muscle depletion, moderate muscle depletion (12% x 6 months) Percent weight loss: 12 % Interventions: Prostat, Tube feeding, MVI Body mass index is 34.57 kg/m.  Data Reviewed:   CBC: Recent Labs  Lab 09/16/24 0504 09/22/24 0510  WBC 7.3 8.8  HGB 10.8* 11.8*  HCT 32.7* 35.6*  MCV 92.1 90.8  PLT 224 239   Basic Metabolic Panel: Recent Labs  Lab 09/17/24 0500 09/21/24 0440 09/22/24 0510  NA 136 133* 136  K 4.1 4.1 3.8  CL 99 97* 96*  CO2 32 31 30  GLUCOSE 204* 245* 175*  BUN 13 15 16   CREATININE 0.33* 0.39* 0.42*  CALCIUM  9.1 9.2 9.7  MG 1.8 1.7 1.8  PHOS 3.5  --  3.8   GFR: Estimated Creatinine Clearance: 75 mL/min (A) (by C-G formula based on SCr of 0.42 mg/dL (L)). Liver Function Tests: No results for input(s): AST, ALT, ALKPHOS, BILITOT, PROT, ALBUMIN  in the last 168 hours. No results for input(s): LIPASE, AMYLASE in the last 168 hours. No results for input(s): AMMONIA in the last 168 hours. Coagulation Profile: No results for input(s): INR, PROTIME in the last 168 hours. Cardiac Enzymes: No results for input(s): CKTOTAL, CKMB, CKMBINDEX, TROPONINI in the last 168 hours. BNP (last 3 results) Recent Labs    05/22/24 0630  PROBNP 3,514.0*   HbA1C: No results for input(s): HGBA1C in the last 72 hours. CBG: Recent Labs  Lab 09/21/24 1238 09/21/24 1719 09/21/24 2133 09/22/24 0623 09/22/24 0817  GLUCAP 289* 315* 276* 169* 128*   Lipid Profile: No results for input(s): CHOL, HDL, LDLCALC, TRIG, CHOLHDL, LDLDIRECT in the last 72 hours. Thyroid  Function Tests: No results for input(s): TSH, T4TOTAL, FREET4, T3FREE, THYROIDAB in the last 72 hours. Anemia Panel: No results for input(s): VITAMINB12, FOLATE, FERRITIN, TIBC, IRON ,  RETICCTPCT in the last 72 hours. Sepsis Labs: No results for input(s): PROCALCITON, LATICACIDVEN in the last 168 hours.  Recent Results (from the past 240 hours)  Varicella-zoster by PCR     Status: Abnormal   Collection Time: 09/19/24 11:18 AM   Specimen: Lesion; Sterile Swab  Result Value Ref Range Status   Varicella-Zoster, PCR Positive (A) Negative Final    Comment: (NOTE) Varicella Zoster Virus DNA detected. This test was developed and its performance characteristics determined by [facility]. It has not been cleared or approved by the Food and Drug Administration. Performed At: [facility]  [facility] [id number] [place], [place] [id number] Jennette Shorter MD Ey:[id number]          Radiology Studies: No results found.         LOS: 67 days   Time spent= 35 mins    Burgess JAYSON Dare, MD [facility]  If 7PM-7AM, please contact night-coverage  09/22/2024, 10:36 AM  "

## 2024-09-22 NOTE — Plan of Care (Signed)
" °  Plan of Care   Date: 09/22/2024   Patient Name: Brenda [name]  DOB: 06/17/1961  MRN: [id number]  Age / Sex: 64 y.o., female  Attending Physician: Caleen Burgess BROCKS, MD Primary Care Physician: [facility] Admit Date: 07/15/2024 Length of Stay: 67 days  Patient and family's goals remain consistent and remains open to all interventions offered to prolong life. Patient is on wait list to transfer to [facility] for further medical interventions.  Thank you for allowing us  to participate in the care of Brenda [name] PMT will sign off at this time, please re-consult if further assistance is needed.   NO CHARGE NOTE.  [name]  Palliative Medicine Team  Team Phone # [phone] (Nights/Weekends) 09/22/2024 8:24 AM  "

## 2024-09-22 NOTE — Progress Notes (Signed)
 IP PROGRESS NOTE  Subjective:   Brenda [name] was lethargic when I saw her at approximately 6:30 AM.  Her family was not present.  Objective: Vital signs in last 24 hours: Blood pressure 125/69, pulse 76, temperature 98.7 F (37.1 C), temperature source Axillary, resp. rate 12, height 5' 2 (1.575 m), weight 189 lb (85.7 kg), last menstrual period 10/30/2014, SpO2 100%.  Intake/Output from previous day: 01/19 0701 - 01/20 0700 In: 510 [I.V.:10; NG/GT:500] Out: 800 [Urine:800]  Physical Exam: She appears comfortable  Neurologic: Lethargic, minimal movement of the right hand and bilateral toes, not following commands, says a few words Abdomen: Left upper quadrant feeding tube with a gauze dressing, firmness with the end and deep to the abdominal pannus surrounding the umbilicus Vascular: No leg edema, SCDs in place Skin: Tape reaction surrounding the Port-A-Cath appears to be resolving, active zoster rash at the left anterior and posterior chest   Portacath/PICC-without erythema  Lab Results: Recent Labs    09/22/24 0510  WBC 8.8  HGB 11.8*  HCT 35.6*  PLT 239     BMET Recent Labs    09/21/24 0440 09/22/24 0510  NA 133* 136  K 4.1 3.8  CL 97* 96*  CO2 31 30  GLUCOSE 245* 175*  BUN 15 16  CREATININE 0.39* 0.42*  CALCIUM  9.2 9.7       Medications: I have reviewed the patient's current medications.  Assessment/Plan:  Endometrial cancer Diagnosed in 2023, status post surgery and radiation Recurrent disease July 2025 with a left retroperitoneal mass, biopsy confirmed adenocarcinoma, ER positive, MSI-high, tumor mutation burden 24, PD-L1 75%, PIK 3CA mutated, PTEN mutated, HER2 1+, loss of MLH1 expression 04/03/2024 CT chest: Spiculated 9 x 12 mm right upper lobe and part solid 4 x 6 mm right upper lobe nodules Cycle 1 paclitaxel /carboplatin /pembrolizumab  05/01/2024 Cycle 2 paclitaxel /carboplatin /pembrolizumab  06/30/2024 06/19/2024 CT abdomen/pelvis: Right lower  lobe pulmonary embolism, splenic infarct, stable size of the left psoas mass with evidence of erosive change at the anterior aspect of L2, decreased size of left external iliac node 08/09/2024 CTs: New psoas muscle mass (tumor versus hematoma), left psoas/paraspinal mass has decreased in size, by my review the previously noted lung nodules have resolved 08/18/2024 CT angiogram abdomen/pelvis-no evidence of active GI bleed, right psoas hematoma, L2 sclerotic change with left paraspinal mass  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  DVT 06/02/2024 4.  Pulmonary embolism noted on CT chest 06/19/2024 5.  Diabetes 6.  CHF/CAD 7.  Seizures, cefepime  toxicity?  November 2025 8.  Progressive generalized loss of motor function, altered mental status-related to CVAs?,  Paraneoplastic?  Toxicity from immunotherapy? 08/11/2024-Solu-Medrol  daily x 3 Therapy plasma exchange 08/21/2024, 08/23/2024, 08/25/2024, 08/27/2024, 08/29/2024 9.  History of respiratory failure 10.  Pain secondary to #1 11.  Anemia-multifactorial, 1 unit RBCs 09/07/2024 12.  History of elevated liver enzymes, now normal  13.  Zoster rash 09/18/2024-valacyclovir   Brenda [name] has a history of endometrial cancer initially diagnosed in 2020.  She was diagnosed with recurrent disease involving a left retroperitoneal mass in July 2025.  She completed 2 treatments with paclitaxel /carboplatin /pembrolizumab .  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  She completed 2 cycles of systemic therapy for treatment of the uterine  cancer.  I reviewed the 08/09/2024 restaging CT findings with Brenda [name] and her daughter.  I reviewed the  08/18/2024 CT angiogram abdomen/pelvis.  The known malignancy at the left retroperitoneum has improved significantly.  The right psoas mass appears to represent a hematoma.      The etiology of the diffuse generalized weakness remains unclear.  There is likely a component related to the embolic CVAs and deconditioning, but the loss of motor function  is more severe than expected.  She has undergone an extensive neurologic evaluation. Neurology is following her in the hospital..  Neurology feels her symptoms may be related to PD-1 inhibitor neurotoxicity, a paraneoplastic syndrome, or another inflammatory process.  She started high-dose Solu-Medrol  on 08/11/2024, given daily for 3 days.  Her neurologic status did not improve.  She began plasma exchange on 08/21/2024.  She completed treatment #5 on 08/29/2024.  There was initial improvement in her neurologic status with more movement and verbal interaction, but she has not shown further improvement. .  She has been evaluated by neurology and noted to have a decline in her neurologic status.  A paraneoplastic syndrome is felt to be a possible.  Neurology is considering a trial of IVIG.  Arrangements have been made for transfer to [facility].   The etiology of her pain is unclear based on the restaging CT.  The retroperitoneal mass is much smaller.  She could be having pain from the right psoas lesion or another etiology.  There is no clear evidence for progression of the metastatic carcinoma.  The significance of the firm fullness around the umbilicus is unclear.  I cannot appreciate an abdominal wall mass on the 08/18/2024 CT.  I recommend attempting to wean the CNS acting agents including narcotics .  She has persistent anemia.  The anemia is multifactorial including components related to bleeding, phlebotomy, and chronic disease.  She will not be a candidate for further treatment of the metastatic endometrial cancer unless her neurologic status and  overall performance status continue to improve.  The loss of MLH1/PMS2 expression on the retroperitoneal biopsy from August 2025 is very likely to indicate a sporadic tumor.  Germline testing cannot be obtained while she is in the hospital.  I am available to speak with her family as needed.   Recommendations: Wean narcotics and other CNS acting agents as tolerated Continue management of of her neurologic status, consider IVIG and transfer to [facility] neurology service if recommended by neurology Anticoagulation with prophylactic dose Lovenox  08/31/2024 tube feedings Systemic treatment for the uterine cancer will remain on hold Continue goals of care discussions by palliative care and the medical service I support transfer to an academic [facility] 8.   Valacyclovir  for treatment of the zoster rash 9.   Please call oncology as needed           LOS: 67 days   Arley Hof, MD   09/22/2024, 1:43 PM

## 2024-09-23 DIAGNOSIS — G9341 Metabolic encephalopathy: Secondary | ICD-10-CM | POA: Diagnosis not present

## 2024-09-23 LAB — BASIC METABOLIC PANEL WITH GFR
Anion gap: 7 (ref 5–15)
BUN: 17 mg/dL (ref 8–23)
CO2: 31 mmol/L (ref 22–32)
Calcium: 8.9 mg/dL (ref 8.9–10.3)
Chloride: 95 mmol/L — ABNORMAL LOW (ref 98–111)
Creatinine, Ser: 0.42 mg/dL — ABNORMAL LOW (ref 0.44–1.00)
GFR, Estimated: >60 mL/min
Glucose, Bld: 427 mg/dL — ABNORMAL HIGH (ref 70–99)
Potassium: 4.2 mmol/L (ref 3.5–5.1)
Sodium: 133 mmol/L — ABNORMAL LOW (ref 135–145)

## 2024-09-23 LAB — CBC
HCT: 30.5 % — ABNORMAL LOW (ref 36.0–46.0)
Hemoglobin: 9.9 g/dL — ABNORMAL LOW (ref 12.0–15.0)
MCH: 30.1 pg (ref 26.0–34.0)
MCHC: 32.5 g/dL (ref 30.0–36.0)
MCV: 92.7 fL (ref 80.0–100.0)
Platelets: 217 K/uL (ref 150–400)
RBC: 3.29 MIL/uL — ABNORMAL LOW (ref 3.87–5.11)
RDW: 15 % (ref 11.5–15.5)
WBC: 7.2 K/uL (ref 4.0–10.5)
nRBC: 0 % (ref 0.0–0.2)

## 2024-09-23 LAB — GLUCOSE, CAPILLARY
Glucose-Capillary: 348 mg/dL — ABNORMAL HIGH (ref 70–99)
Glucose-Capillary: 344 mg/dL — ABNORMAL HIGH (ref 70–99)
Glucose-Capillary: 338 mg/dL — ABNORMAL HIGH (ref 70–99)
Glucose-Capillary: 294 mg/dL — ABNORMAL HIGH (ref 70–99)
Glucose-Capillary: 303 mg/dL — ABNORMAL HIGH (ref 70–99)

## 2024-09-23 MED ORDER — INSULIN ASPART 100 UNIT/ML IJ SOLN: 9.0000 [IU] | Freq: Three times a day (TID) | INTRAMUSCULAR | Status: DC

## 2024-09-23 MED ORDER — INSULIN GLARGINE 100 UNIT/ML ~~LOC~~ SOLN
8.0000 [IU] | Freq: Once | SUBCUTANEOUS | Status: AC
  Administered 2024-09-23: 8 [IU] via SUBCUTANEOUS
  Filled 2024-09-23: qty 0.08

## 2024-09-23 MED ORDER — INSULIN GLARGINE 100 UNIT/ML ~~LOC~~ SOLN
38.0000 [IU] | Freq: Two times a day (BID) | SUBCUTANEOUS | Status: DC
  Filled 2024-09-23: qty 0.38

## 2024-09-23 MED ORDER — GLUCERNA 1.5 CAL PO LIQD
237.0000 mL | Freq: Three times a day (TID) | ORAL | Status: AC
  Administered 2024-09-23 – 2024-10-09 (×65): 237 mL
  Filled 2024-09-23 (×8): qty 237
  Filled 2024-09-23: qty 1000
  Filled 2024-09-23 (×3): qty 237
  Filled 2024-09-23: qty 1000
  Filled 2024-09-23 (×17): qty 237
  Filled 2024-09-23: qty 1000
  Filled 2024-09-23 (×15): qty 237
  Filled 2024-09-23: qty 1000
  Filled 2024-09-23 (×3): qty 237
  Filled 2024-09-23: qty 1000
  Filled 2024-09-23: qty 237
  Filled 2024-09-23: qty 1000
  Filled 2024-09-23 (×4): qty 237
  Filled 2024-09-23: qty 1000
  Filled 2024-09-23 (×8): qty 237
  Filled 2024-09-23: qty 1000
  Filled 2024-09-23 (×5): qty 237

## 2024-09-23 MED ORDER — INSULIN GLARGINE 100 UNIT/ML ~~LOC~~ SOLN
30.0000 [IU] | Freq: Two times a day (BID) | SUBCUTANEOUS | Status: DC
  Administered 2024-09-23 – 2024-09-24 (×2): 30 [IU] via SUBCUTANEOUS
  Filled 2024-09-23 (×5): qty 0.3

## 2024-09-23 MED ORDER — INSULIN ASPART 100 UNIT/ML IJ SOLN
7.0000 [IU] | Freq: Three times a day (TID) | INTRAMUSCULAR | Status: DC
  Filled 2024-09-23: qty 1

## 2024-09-23 NOTE — Inpatient Diabetes Management (Signed)
 Inpatient Diabetes Program Recommendations  AACE/ADA: New Consensus Statement on Inpatient Glycemic Control (2015)  Target Ranges:  Prepandial:   less than 140 mg/dL      Peak postprandial:   less than 180 mg/dL (1-2 hours)      Critically ill patients:  140 - 180 mg/dL    Latest Reference Range & Units 09/22/24 06:23 09/22/24 08:17 09/22/24 11:49 09/22/24 16:03 09/22/24 21:20  Glucose-Capillary 70 - 99 mg/dL 830 (H)  2 units Novolog   128 (H) 89   Lantus  HELD 156 (H)  2 units Novolog  @1849  356 (H)  5 units Novolog    30 units Lantus     Latest Reference Range & Units 09/23/24 08:03  Glucose-Capillary 70 - 99 mg/dL 655 (H)  7 units Novolog   30 units Lantus   (H): Data is abnormally high    Home DM Meds: Tresiba  18 units daily        Fiasp  1-3 units TID with meals      Metformin  1000 mg BID   Current Orders: Lantus  38 units BID     Novolog  Sensitive Correction Scale/ SSI (0-9 units) TID AC + HS     Novolog  9 units TID with meals   Note Lantus  HELD yest AM  Not getting the Novolog  7 units meal coverage b/c not eating enough  Started Bolus tube feeds QID yesterday as well    MD- Please consider:  1. Reduce Lantus  back to 30 units BID  2. Stop the Novolog  9 units TID with meals  3. Start Novolog  Tuube Feed Coverage: Novolog  6 units QID (10am, 2pm, 6pm, 10pm)    --Will follow patient during hospitalization--  Adina Rudolpho Arrow RN, MSN, CDCES Diabetes Coordinator Inpatient Glycemic Control Team Team Pager: [phone] (8a-5p)

## 2024-09-23 NOTE — Progress Notes (Signed)
 " PROGRESS NOTE    Brenda [name]  FMW:[id number] DOB: [birth year] DOA: 07/15/2024 PCP: [facility]    Brief Narrative:  64 year old with history of metastatic endometrial cancer, chronic pain, DM 2, HTN, HLD, OSA, CAD, CHF, CVA, DVT/PE on Lovenox , chronic hypoxia on 2 L nasal cannula was recently hospitalized at [facility] on 11/9 - 11/11 for UTI treated with antibiotics discharged on p.o. presented back again to the hospital due to worsening mental status.  Eventually transferred to [facility] and EEG showed concerns of seizures therefore started on Keppra .  Also received empiric antibiotics for concerns of meningitis/encephalitis but CSF panel was negative therefore discontinued.  Patient underwent high-dose steroids earlier in the summer thereafter plasmapheresis around Christmas time.  Due to worsening catatonia/stiffness, repeat EEG performed which was unremarkable.  Neurology was reconsulted who recommended possibly having second opinion from tertiary care center.  Patient currently on wait list at [facility].  While awaiting, noted to have vesicular rash concerning for VZV which was positive therefore started on valacyclovir .  Seen by infectious disease.  Assessment & Plan:  Acute metabolic encephalopathy Seizures Patient undergone extensive workup.  Seen by neurology and oncology.  Overall infectious etiologies were ruled out therefore antibiotics were eventually discontinued.  MRI brain showed concerns of evolving infarct.  There was also concerns of neurotoxicity secondary to Keytruda  therefore was discontinued. -Status post PLEX x 5 days completed Dec 12/28 -Status post high-dose steroids 12/9 - 12/11 -Now on Keppra  500 mg twice daily - Repeat EEG unremarkable - Seen by neurology again, considering IVIG - Repeating MRI brain, talk to neurology Dr. Voncile  Attempted to transfer her to [facility] on 1/16 for second opinion, declined. Initially accepted for  transfer to [facility] by Dr. Reta but their neurology team called again on 1/22 discussed the case in detail with Dr Voncile.  Eventually was determined patient does not need any further neurology workup from their standpoint therefore declined transfer.  Left scalp ring lesion Vesicular rash around left back/axilla VZV infection Concerns of tinea corporis therefore started terbinafine  topical for 2 weeks, EOT 1/29 -Seen by ID.  HSV PCR negative, VZV positive.  Continue valacyclovir , can transition to IV acyclovir if clinically declines - Continue airborne precaution until all lesions have scabbed over.  Anxiety; stable -Improved after starting BuSpar  5 mg p.o. twice daily - Continue Ativan  1 mg IV every 4 hours as needed    Metastatic Endometrial Cancer Cancer Related Pain  Seen by oncology and palliative care. She had had multiple imagings done during this hospitalization.  Per oncology note, there is no clear evidence of progression of the metastatic carcinoma. Keytruda  on hold as explained above. Pain regimen --- Scheduled: Oxycodone  5 mg every 8 hours. Reduced to BID today. --- PRN: IV Dilaudid , oxycodone , Tylenol     Recent DVT/PE,Splenic Infarct In October 2025, patient was diagnosed to have right leg DVT, PE as well as a splenic infarct and she was started on anticoagulation.  This hospitalization, anticoagulation was held due to psoas hematoma and GI bleed.   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   Patient had BRBPR during this hospitalization BRBPR resolved at this time.  CT GI bleed negative.  Required PRBC transfusion during hospitalization.  Therapeutic Lovenox  changed to prophylaxis to Lovenox    Recurrent UTI, Klebsiella Completed 5 days of IV Rocephin  1/10  Dysphagia Moderate malnutrition -Placed PEG tube by IR on 1/6   Type 2 diabetes  mellitus A1c 7.2.  On long-acting and sliding scale.  Adjust as necessary    Hypertension IV as needed     Depression/anxiety Prozac  discontinued due to rigidity -Continue Ativan  as needed   H/o CAD, Stroke HLD Currently on Lipitor  80 mg daily.  Anticoagulation on hold as discussed above   Abnormal Thyroid  Function Tests Initially low TSH and elevated free T4, was started on propranolol .  Repeat levels have normalized   Vaginal discharge, persist Received a dose of Diflucan  1/12, give another dose on 1/17   Hypomagnesemia As needed repletion   DVT prophylaxis: Lovenox     Code Status: Limited: Do not attempt resuscitation (DNR) -DNR-LIMITED -Do Not Intubate/DNI  Family Communication:  Called [name] Status is: Inpatient Ongoing treatment for disseminated zoster   PT Follow up Recs: Skilled Nursing-Short Term Rehab (<3 Hours/Day)09/09/2024 1500  Subjective: Answers very basic questions  No new complaints  Examination:  General exam: Appears calm and comfortable  Respiratory system: Clear to auscultation. Respiratory effort normal. Cardiovascular system: S1 & S2 heard, RRR. No JVD, murmurs, rubs, gallops or clicks. No pedal edema. Gastrointestinal system: Abdomen is nondistended, soft and nontender. No organomegaly or masses felt. Normal bowel sounds heard. Central nervous system: Alert to name Extremities: Symmetric 4 x 5 power. Skin: Left scalp ring lesion Psychiatry: Judgement and insight appear poor PEG tube Right-sided chest wall Chemo-Port Foley catheter           Wound 08/11/24 0415 Pressure Injury Vertebral column Medial Stage 2 -  Partial thickness loss of dermis presenting as a shallow open injury with a red, pink wound bed without slough. (Active)     Wound 08/31/24 2100 Pressure Injury Thigh Left;Posterior Stage 1 -  Intact skin with non-blanchable redness of a localized area usually over a bony prominence. (Active)     Diet Orders (From  admission, onward)     Start     Ordered   09/09/24 0808  Diet regular Room service appropriate? Yes; Fluid consistency: Thin  Diet effective now       Question Answer Comment  Room service appropriate? Yes   Fluid consistency: Thin      09/09/24 0807            Objective: Vitals:   09/23/24 0300 09/23/24 0400 09/23/24 0600 09/23/24 0806  BP:  107/80  (!) 128/46  Pulse: 70 72 62 69  Resp: (!) 8 20 15 13   Temp:  (!) 101.1 F (38.4 C)  98.5 F (36.9 C)  TempSrc:  Axillary  Axillary  SpO2: 100% 100% 100% 100%  Weight:      Height:        Intake/Output Summary (Last 24 hours) at 09/23/2024 1017 Last data filed at 09/23/2024 0844 Gross per 24 hour  Intake 500 ml  Output 400 ml  Net 100 ml   Filed Weights   09/07/24 0430 09/08/24 0500 09/19/24 0255  Weight: 87.1 kg 84.5 kg 85.7 kg    Scheduled Meds:  atorvastatin   80 mg Per Tube QHS   busPIRone   5 mg Oral BID   Chlorhexidine  Gluconate Cloth  6 each Topical Daily   enoxaparin  (LOVENOX ) injection  40 mg Subcutaneous Q24H   famotidine   20 mg Per Tube BID   feeding supplement (GLUCERNA 1.5 CAL)  237 mL Per Tube TID WC & HS   feeding supplement (PROSource TF20)  60 mL Per Tube Daily  fentaNYL   1 patch Transdermal Q72H   FLUoxetine   40 mg Per Tube Daily   free water   100 mL Per Tube 5 X Daily   gabapentin   100 mg Per Tube BID   insulin  aspart  0-5 Units Subcutaneous QHS   insulin  aspart  0-9 Units Subcutaneous TID WC   insulin  aspart  9 Units Subcutaneous TID WC   insulin  glargine  38 Units Subcutaneous BID   insulin  glargine  8 Units Subcutaneous Once   levETIRAcetam   500 mg Per Tube BID   lidocaine   1 patch Transdermal Daily   nystatin   100,000 Units Topical TID   mouth rinse  15 mL Mouth Rinse 4 times per day   oxyCODONE   5 mg Per Tube BID   polyethylene glycol  17 g Per Tube Daily   sodium chloride  flush  10-40 mL Intracatheter Q12H   terbinafine   1 Application Topical BID   valACYclovir   1,000 mg Oral TID    Continuous Infusions:  Nutritional status Signs/Symptoms: percent weight loss, mild muscle depletion, moderate muscle depletion (12% x 6 months) Percent weight loss: 12 % Interventions: Prostat, Tube feeding, MVI Body mass index is 34.57 kg/m.  Data Reviewed:   CBC: Recent Labs  Lab 09/22/24 0510 09/23/24 0443  WBC 8.8 7.2  HGB 11.8* 9.9*  HCT 35.6* 30.5*  MCV 90.8 92.7  PLT 239 217   Basic Metabolic Panel: Recent Labs  Lab 09/17/24 0500 09/21/24 0440 09/22/24 0510 09/23/24 0443  NA 136 133* 136 133*  K 4.1 4.1 3.8 4.2  CL 99 97* 96* 95*  CO2 32 31 30 31   GLUCOSE 204* 245* 175* 427*  BUN 13 15 16 17   CREATININE 0.33* 0.39* 0.42* 0.42*  CALCIUM  9.1 9.2 9.7 8.9  MG 1.8 1.7 1.8  --   PHOS 3.5  --  3.8  --    GFR: Estimated Creatinine Clearance: 75 mL/min (A) (by C-G formula based on SCr of 0.42 mg/dL (L)). Liver Function Tests: No results for input(s): AST, ALT, ALKPHOS, BILITOT, PROT, ALBUMIN  in the last 168 hours. No results for input(s): LIPASE, AMYLASE in the last 168 hours. No results for input(s): AMMONIA in the last 168 hours. Coagulation Profile: No results for input(s): INR, PROTIME in the last 168 hours. Cardiac Enzymes: No results for input(s): CKTOTAL, CKMB, CKMBINDEX, TROPONINI in the last 168 hours. BNP (last 3 results) Recent Labs    05/22/24 0630  PROBNP 3,514.0*   HbA1C: No results for input(s): HGBA1C in the last 72 hours. CBG: Recent Labs  Lab 09/22/24 1149 09/22/24 1603 09/22/24 2120 09/23/24 0606 09/23/24 0803  GLUCAP 89 156* 356* 348* 344*   Lipid Profile: No results for input(s): CHOL, HDL, LDLCALC, TRIG, CHOLHDL, LDLDIRECT in the last 72 hours. Thyroid  Function Tests: No results for input(s): TSH, T4TOTAL, FREET4, T3FREE, THYROIDAB in the last 72 hours. Anemia Panel: No results for input(s): VITAMINB12, FOLATE, FERRITIN, TIBC, IRON , RETICCTPCT in the last  72 hours. Sepsis Labs: No results for input(s): PROCALCITON, LATICACIDVEN in the last 168 hours.  Recent Results (from the past 240 hours)  Varicella-zoster by PCR     Status: Abnormal   Collection Time: 09/19/24 11:18 AM   Specimen: Lesion; Sterile Swab  Result Value Ref Range Status   Varicella-Zoster, PCR Positive (A) Negative Final    Comment: (NOTE) Varicella Zoster Virus DNA detected. This test was developed and its performance characteristics determined by [facility]. It has not been cleared or approved by the Food and  Drug Administration. Performed At: [facility] [address] [place], [place] [id number] Jennette Shorter MD Ey:[id number]          Radiology Studies: No results found.         LOS: 68 days   Time spent= 35 mins    Burgess JAYSON Dare, MD [facility]  If 7PM-7AM, please contact night-coverage  09/23/2024, 10:17 AM  "

## 2024-09-23 NOTE — Plan of Care (Signed)
 Received a call from [facility] transfer center.  Discussed the case in detail, over 25 minutes, with the neurologist at [facility].  They advised that the treatment options that we have provided in the workup that we have done is extensive and they would not be doing any different workup or treatment, and with the current capacity challenges, they declined transfer.  This was relayed to Dr. Caleen.

## 2024-09-24 ENCOUNTER — Inpatient Hospital Stay (HOSPITAL_COMMUNITY)

## 2024-09-24 DIAGNOSIS — G9341 Metabolic encephalopathy: Secondary | ICD-10-CM | POA: Diagnosis not present

## 2024-09-24 LAB — GLUCOSE, CAPILLARY
Glucose-Capillary: 118 mg/dL — ABNORMAL HIGH (ref 70–99)
Glucose-Capillary: 134 mg/dL — ABNORMAL HIGH (ref 70–99)
Glucose-Capillary: 164 mg/dL — ABNORMAL HIGH (ref 70–99)
Glucose-Capillary: 166 mg/dL — ABNORMAL HIGH (ref 70–99)
Glucose-Capillary: 56 mg/dL — ABNORMAL LOW (ref 70–99)
Glucose-Capillary: 70 mg/dL (ref 70–99)
Glucose-Capillary: 86 mg/dL (ref 70–99)

## 2024-09-24 MED ORDER — GADOBUTROL 1 MMOL/ML IV SOLN
8.5000 mL | Freq: Once | INTRAVENOUS | Status: AC | PRN
  Administered 2024-09-24: 8.5 mL via INTRAVENOUS

## 2024-09-24 MED ORDER — GABAPENTIN 250 MG/5ML PO SOLN
200.0000 mg | Freq: Two times a day (BID) | ORAL | Status: AC
  Administered 2024-09-25 – 2024-10-09 (×31): 200 mg
  Filled 2024-09-24 (×34): qty 4

## 2024-09-24 MED ORDER — LORAZEPAM 2 MG/ML IJ SOLN
1.0000 mg | Freq: Once | INTRAMUSCULAR | Status: AC | PRN
  Administered 2024-09-24: 1 mg via INTRAVENOUS
  Filled 2024-09-24: qty 1

## 2024-09-24 NOTE — Progress Notes (Signed)
 Hypoglycemic Event  CBG: 56  Treatment:   IV glucagon  per order Symptoms: None  Follow-up CBG: Time: 2333 CBG Result:118  Possible Reasons for Event: Inadequate meal intake  Comments/MD notified: Drue, MD aware of the above . Tech took achs glucose at 2114 , glucose 70, per husband wanted glucose rechecked due to reported poor meal intake today, glucose was 56. Provider on call paged and notified and was instructed to hold night time lantus  dose, see EMAR.     Renda Gavel

## 2024-09-24 NOTE — Progress Notes (Addendum)
 Pt being taken down for MRI at this time . Ordered meds given, husband at bedside aware of the above. Per MRI staff, MRI was able to be completed.

## 2024-09-24 NOTE — Progress Notes (Signed)
 " PROGRESS NOTE    Brenda [name]  FMW:[id number] DOB: 1961-07-11 DOA: 07/15/2024 PCP: [facility]    Brief Narrative:  64 year old with history of metastatic endometrial cancer, chronic pain, DM 2, HTN, HLD, OSA, CAD, CHF, CVA, DVT/PE on Lovenox , chronic hypoxia on 2 L nasal cannula was recently hospitalized at [facility] on 11/9 - 11/11 for UTI treated with antibiotics discharged on p.o. presented back again to the hospital due to worsening mental status.  Eventually transferred to [facility] and EEG showed concerns of seizures therefore started on Keppra .  Also received empiric antibiotics for concerns of meningitis/encephalitis but CSF panel was negative therefore discontinued.  Patient underwent high-dose steroids earlier in the summer thereafter plasmapheresis around Christmas time.  Due to worsening catatonia/stiffness, repeat EEG performed which was unremarkable.  Concerning for VZV rash therefore started on IV valacyclovir , seen by ID.  Assessment & Plan:  Acute metabolic encephalopathy Seizures Patient undergone extensive workup.  Seen by neurology and oncology.  Overall infectious etiologies were ruled out therefore antibiotics were eventually discontinued.  MRI brain showed concerns of evolving infarct.  There was also concerns of neurotoxicity secondary to Keytruda  therefore was discontinued. -Status post PLEX x 5 days completed Dec 12/28 -Status post high-dose steroids 12/9 - 12/11 -Now on Keppra  500 mg twice daily - Repeat EEG unremarkable - Seen by neurology again, considering IVIG - Repeating MRI brain is pending, talk to neurology Dr. Voncile.  This is still pending, I have communicated this to the nursing staff and MRI team that this cannot be delayed as it has been ordered for last 48 hours.  They will get this done tonight  Attempted to transfer her to [facility] on 1/16 for second opinion, declined. Initially accepted for transfer to [facility] by  Dr. Reta but their neurology team called again on 1/22 discussed the case in detail with Dr Voncile.  Eventually was determined patient does not need any further neurology workup from their standpoint therefore declined transfer.  Left scalp ring lesion Vesicular rash around left back/axilla VZV infection Concerns of tinea corporis therefore started terbinafine  topical for 2 weeks, EOT 1/29 -Seen by ID.  HSV PCR negative, VZV positive.  Continue valacyclovir , tentative plans for 10 days total, EOT 1/25 - Continue airborne precaution until all lesions have scabbed over.  Nursing staff to update photos later today when they turn her  Anxiety; stable - BuSpar  5 mg twice daily - Continue Ativan  1 mg IV every 4 hours as needed    Metastatic Endometrial Cancer Cancer Related Pain  Seen by oncology and palliative care. She had had multiple imagings done during this hospitalization.  Per oncology note, there is no clear evidence of progression of the metastatic carcinoma. Keytruda  on hold as explained above. Pain regimen --- Scheduled: Oxycodone  5 mg every 8 hours. Reduced to BID today. --- PRN: IV Dilaudid , oxycodone , Tylenol     Recent DVT/PE,Splenic Infarct In October 2025, patient was diagnosed to have right leg DVT, PE as well as a splenic infarct and she was started on anticoagulation.  This hospitalization, anticoagulation was held due to psoas hematoma and GI bleed.   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   Patient had BRBPR during this hospitalization BRBPR resolved at this time.  CT GI bleed negative.  Required PRBC transfusion during hospitalization.  Therapeutic Lovenox  changed  to prophylaxis to Lovenox    Recurrent UTI, Klebsiella Completed 5 days of IV Rocephin  1/10   Dysphagia Moderate malnutrition -Placed PEG tube by IR on 1/6   Type 2  diabetes mellitus A1c 7.2.  On long-acting and sliding scale.  Adjust as necessary    Hypertension IV as needed     Depression/anxiety Prozac  discontinued due to rigidity -Continue Ativan  as needed   H/o CAD, Stroke HLD Currently on Lipitor  80 mg daily.  Anticoagulation on hold as discussed above   Abnormal Thyroid  Function Tests Initially low TSH and elevated free T4, was started on propranolol .  Repeat levels have normalized   Vaginal discharge, persist Received a dose of Diflucan  1/12, give another dose on 1/17   Hypomagnesemia As needed repletion  Final disposition will be difficult in terms of placement.  TOC team is aware   DVT prophylaxis: Lovenox     Code Status: Limited: Do not attempt resuscitation (DNR) -DNR-LIMITED -Do Not Intubate/DNI  Family Communication:  Called [name] Status is: Inpatient Ongoing treatment for disseminated zoster   PT Follow up Recs: Skilled Nursing-Short Term Rehab (<3 Hours/Day)09/09/2024 1500  Subjective: Seen at bedside, no new complaints.  Answers very basic questions. Examination:  General exam: Appears calm and comfortable  Respiratory system: Clear to auscultation. Respiratory effort normal. Cardiovascular system: S1 & S2 heard, RRR. No JVD, murmurs, rubs, gallops or clicks. No pedal edema. Gastrointestinal system: Abdomen is nondistended, soft and nontender. No organomegaly or masses felt. Normal bowel sounds heard. Central nervous system: Alert to name Extremities: Symmetric 4 x 5 power.  Stiff left upper extremity Skin: Left scalp ring lesion, also has a vesicular lesion around the left back axilla and back region. Psychiatry: Judgement and insight appear poor PEG tube Right-sided chest wall Chemo-Port Foley catheter           Wound 08/11/24 0415 Pressure Injury Vertebral column Medial Stage 2 -  Partial thickness loss of dermis presenting as a shallow open injury with a red, pink wound bed without slough.  (Active)     Wound 08/31/24 2100 Pressure Injury Thigh Left;Posterior Stage 1 -  Intact skin with non-blanchable redness of a localized area usually over a bony prominence. (Active)     Diet Orders (From admission, onward)     Start     Ordered   09/09/24 0808  Diet regular Room service appropriate? Yes; Fluid consistency: Thin  Diet effective now       Question Answer Comment  Room service appropriate? Yes   Fluid consistency: Thin      09/09/24 0807            Objective: Vitals:   09/23/24 2006 09/24/24 0009 09/24/24 0402 09/24/24 0845  BP: (!) 181/76 (!) 153/76 (!) 169/66 (!) 177/82  Pulse: 97 92 (!) 103 93  Resp: 20 20 20 16   Temp: 98.7 F (37.1 C) 99 F (37.2 C) 98.8 F (37.1 C) 98.8 F (37.1 C)  TempSrc: Axillary Axillary Oral Axillary  SpO2: 99% 99% 100% 97%  Weight:      Height:        Intake/Output Summary (Last 24 hours) at 09/24/2024 1021 Last data filed at 09/24/2024 0947 Gross per 24 hour  Intake 837 ml  Output 640 ml  Net 197 ml   Filed Weights   09/07/24 0430 09/08/24 0500 09/19/24 0255  Weight: 87.1 kg 84.5 kg 85.7 kg    Scheduled Meds:  atorvastatin   80 mg Per Tube QHS   busPIRone   5 mg Oral BID   Chlorhexidine  Gluconate Cloth  6 each Topical Daily   enoxaparin  (LOVENOX ) injection  40 mg Subcutaneous Q24H   famotidine   20 mg Per Tube BID   feeding supplement (GLUCERNA 1.5 CAL)  237 mL Per Tube TID WC & HS   feeding supplement (PROSource TF20)  60 mL Per Tube Daily   fentaNYL   1 patch Transdermal Q72H   FLUoxetine   40 mg Per Tube Daily   free water   100 mL Per Tube 5 X Daily   gabapentin   100 mg Per Tube BID   insulin  aspart  0-5 Units Subcutaneous QHS   insulin  aspart  0-9 Units Subcutaneous TID WC   insulin  aspart  7 Units Subcutaneous TID WC   insulin  glargine  30 Units Subcutaneous BID   levETIRAcetam   500 mg Per Tube BID   lidocaine   1 patch Transdermal Daily   LORazepam   1 mg Intravenous Once   nystatin   100,000 Units Topical  TID   mouth rinse  15 mL Mouth Rinse 4 times per day   oxyCODONE   5 mg Per Tube BID   polyethylene glycol  17 g Per Tube Daily   sodium chloride  flush  10-40 mL Intracatheter Q12H   terbinafine   1 Application Topical BID   valACYclovir   1,000 mg Oral TID   Continuous Infusions:  Nutritional status Signs/Symptoms: percent weight loss, mild muscle depletion, moderate muscle depletion (12% x 6 months) Percent weight loss: 12 % Interventions: Prostat, Tube feeding, MVI Body mass index is 34.57 kg/m.  Data Reviewed:   CBC: Recent Labs  Lab 09/22/24 0510 09/23/24 0443  WBC 8.8 7.2  HGB 11.8* 9.9*  HCT 35.6* 30.5*  MCV 90.8 92.7  PLT 239 217   Basic Metabolic Panel: Recent Labs  Lab 09/21/24 0440 09/22/24 0510 09/23/24 0443  NA 133* 136 133*  K 4.1 3.8 4.2  CL 97* 96* 95*  CO2 31 30 31   GLUCOSE 245* 175* 427*  BUN 15 16 17   CREATININE 0.39* 0.42* 0.42*  CALCIUM  9.2 9.7 8.9  MG 1.7 1.8  --   PHOS  --  3.8  --    GFR: Estimated Creatinine Clearance: 75 mL/min (A) (by C-G formula based on SCr of 0.42 mg/dL (L)). Liver Function Tests: No results for input(s): AST, ALT, ALKPHOS, BILITOT, PROT, ALBUMIN  in the last 168 hours. No results for input(s): LIPASE, AMYLASE in the last 168 hours. No results for input(s): AMMONIA in the last 168 hours. Coagulation Profile: No results for input(s): INR, PROTIME in the last 168 hours. Cardiac Enzymes: No results for input(s): CKTOTAL, CKMB, CKMBINDEX, TROPONINI in the last 168 hours. BNP (last 3 results) Recent Labs    05/22/24 0630  PROBNP 3,514.0*   HbA1C: No results for input(s): HGBA1C in the last 72 hours. CBG: Recent Labs  Lab 09/23/24 1224 09/23/24 1651 09/23/24 2119 09/24/24 0613 09/24/24 0845  GLUCAP 338* 294* 303* 164* 134*   Lipid Profile: No results for input(s): CHOL, HDL, LDLCALC, TRIG, CHOLHDL, LDLDIRECT in the last 72 hours. Thyroid  Function Tests: No  results for input(s): TSH, T4TOTAL, FREET4, T3FREE, THYROIDAB in the last 72 hours. Anemia Panel: No results for input(s): VITAMINB12, FOLATE, FERRITIN, TIBC, IRON , RETICCTPCT in the last 72 hours. Sepsis Labs: No results for input(s): PROCALCITON, LATICACIDVEN in the last 168 hours.  Recent Results (from the past 240 hours)  Varicella-zoster by PCR     Status: Abnormal   Collection Time: 09/19/24 11:18 AM  Specimen: Lesion; Sterile Swab  Result Value Ref Range Status   Varicella-Zoster, PCR Positive (A) Negative Final    Comment: (NOTE) Varicella Zoster Virus DNA detected. This test was developed and its performance characteristics determined by [facility]. It has not been cleared or approved by the Food and Drug Administration. Performed At: [facility] [address] [place], [place] [id number] Jennette Shorter MD Ey:[id number]          Radiology Studies: No results found.         LOS: 69 days   Time spent= 35 mins    Burgess JAYSON Dare, MD [facility]  If 7PM-7AM, please contact night-coverage  09/24/2024, 10:21 AM  "

## 2024-09-25 DIAGNOSIS — G9341 Metabolic encephalopathy: Secondary | ICD-10-CM | POA: Diagnosis not present

## 2024-09-25 LAB — GLUCOSE, CAPILLARY
Glucose-Capillary: 73 mg/dL (ref 70–99)
Glucose-Capillary: 48 mg/dL — ABNORMAL LOW (ref 70–99)
Glucose-Capillary: 139 mg/dL — ABNORMAL HIGH (ref 70–99)
Glucose-Capillary: 159 mg/dL — ABNORMAL HIGH (ref 70–99)
Glucose-Capillary: 205 mg/dL — ABNORMAL HIGH (ref 70–99)

## 2024-09-25 MED ORDER — INSULIN GLARGINE 100 UNIT/ML ~~LOC~~ SOLN
25.0000 [IU] | Freq: Every day | SUBCUTANEOUS | Status: DC
  Administered 2024-09-26: 25 [IU] via SUBCUTANEOUS
  Filled 2024-09-25 (×2): qty 0.25

## 2024-09-25 MED ORDER — DEXTROSE 50 % IV SOLN
INTRAVENOUS | Status: AC
  Administered 2024-09-25: 50 mL
  Filled 2024-09-25: qty 50

## 2024-09-25 MED ORDER — DEXTROSE 50 % IV SOLN
INTRAVENOUS | Status: AC
  Filled 2024-09-25: qty 50

## 2024-09-25 NOTE — TOC Progression Note (Signed)
 Transition of Care [facility]) - Progression Note    Patient Details  Name: Brenda [name] MRN: [id number] Date of Birth: 1961/08/28  Transition of Care [facility]) CM/SW Contact  Montie LOISE Louder, [place] Phone Number: 09/25/2024, 12:38 PM  Clinical Narrative:     [facility] Transition Coordinator [name] - updated, [facility] declined transfer. If the patient returns home [name] # [phone].     Expected Discharge Plan: Acute to Acute Transfer Barriers to Discharge: Other (must enter comment), Continued Medical Work up, Inadequate or no insurance (awaiting bed at [facility])               Expected Discharge Plan and Services                                               Social Drivers of Health (SDOH) Interventions SDOH Screenings   Food Insecurity: No Food Insecurity (07/16/2024)  Recent Concern: Food Insecurity - Food Insecurity Present (04/20/2024)  Housing: Low Risk (07/16/2024)  Transportation Needs: No Transportation Needs (07/16/2024)  Utilities: Not At Risk (07/16/2024)  Depression (PHQ2-9): Low Risk (06/30/2024)  Recent Concern: Depression (PHQ2-9) - Medium Risk (05/07/2024)  Social Connections: Moderately Integrated (07/13/2024)  Tobacco Use: Medium Risk (09/07/2024)    Readmission Risk Interventions    07/14/2024    2:34 PM 05/04/2024   10:00 AM  Readmission Risk Prevention Plan  Transportation Screening Complete Complete  PCP or Specialist Appt within 3-5 Days  Complete  HRI or Home Care Consult  Complete  Social Work Consult for Recovery Care Planning/Counseling  Complete  Palliative Care Screening  Not Applicable  Medication Review Oceanographer) Complete Complete  PCP or Specialist appointment within 3-5 days of discharge Complete   HRI or Home Care Consult Complete   SW Recovery Care/Counseling Consult Complete   Palliative Care Screening Not Applicable   Skilled Nursing Facility Not Applicable

## 2024-09-25 NOTE — Plan of Care (Signed)

## 2024-09-25 NOTE — Plan of Care (Signed)
" °  Problem: Fluid Volume: Goal: Ability to maintain a balanced intake and output will improve Outcome: Progressing   Problem: Metabolic: Goal: Ability to maintain appropriate glucose levels will improve Outcome: Progressing   Problem: Nutritional: Goal: Maintenance of adequate nutrition will improve Outcome: Progressing Goal: Progress toward achieving an optimal weight will improve Outcome: Progressing   Problem: Skin Integrity: Goal: Risk for impaired skin integrity will decrease Outcome: Progressing   Problem: Tissue Perfusion: Goal: Adequacy of tissue perfusion will improve Outcome: Progressing   Problem: Clinical Measurements: Goal: Ability to maintain clinical measurements within normal limits will improve Outcome: Progressing Goal: Will remain free from infection Outcome: Progressing Goal: Diagnostic test results will improve Outcome: Progressing Goal: Respiratory complications will improve Outcome: Progressing   Problem: Nutrition: Goal: Adequate nutrition will be maintained Outcome: Progressing   Problem: Elimination: Goal: Will not experience complications related to bowel motility Outcome: Progressing Goal: Will not experience complications related to urinary retention Outcome: Progressing   Problem: Pain Managment: Goal: General experience of comfort will improve and/or be controlled Outcome: Progressing   Problem: Safety: Goal: Ability to remain free from injury will improve Outcome: Progressing   Problem: Skin Integrity: Goal: Risk for impaired skin integrity will decrease Outcome: Progressing   "

## 2024-09-25 NOTE — Plan of Care (Signed)

## 2024-09-25 NOTE — Inpatient Diabetes Management (Signed)
 Inpatient Diabetes Program Recommendations  AACE/ADA: New Consensus Statement on Inpatient Glycemic Control (2015)  Target Ranges:  Prepandial:   less than 140 mg/dL      Peak postprandial:   less than 180 mg/dL (1-2 hours)      Critically ill patients:  140 - 180 mg/dL    Latest Reference Range & Units 09/24/24 08:45 09/24/24 12:34 09/24/24 16:34 09/24/24 21:14 09/24/24 22:34 09/24/24 23:33 09/25/24 06:19 09/25/24 08:43 09/25/24 09:41  Glucose-Capillary 70 - 99 mg/dL 865 (H) 833 (H) 86 70 56 (L) 118 (H) 73 48 (L) 139 (H)   Home DM Meds: Tresiba  18 units daily        Fiasp  1-3 units TID with meals      Metformin  1000 mg BID   Current Orders: Lantus  30 units BID     Novolog  Sensitive Correction Scale/ SSI (0-9 units) TID AC + HS     Novolog  7 units TID with meals   Note HYPOglycemia yesterday only received Lantus  30 once yesterday no meal coverage given only correction scale  Glucerna tid and hs bolus  MD- Please consider:  1. Reduce Lantus  back to 25 DAILY  2. HOLD Novolog  Tube Feed Coverage: Novolog  6 units QID (10am, 2pm, 6pm, 10pm)  3. Watch on Novolog  sliding scale  --Will follow patient during hospitalization--  Thanks Clotilda Bull RN, MSN, BC-ADM Inpatient Diabetes Coordinator Team Pager [phone] (8a-5p)

## 2024-09-25 NOTE — TOC Progression Note (Signed)
 Transition of Care [facility]) - Progression Note    Patient Details  Name: Brenda [name] MRN: [id number] Date of Birth: 28-Oct-1960  Transition of Care [facility]) CM/SW Contact  Montie LOISE Louder, [place] Phone Number: 09/25/2024, 3:22 PM  Clinical Narrative:     CSW spoke with patient's spouse- CSW inquired about disposition plan once stable - he states he was not sure and needed to discuss with his daughter.  Montie Louder, MSW, LCSW Clinical Social Worker    Expected Discharge Plan: Acute to Acute Transfer Barriers to Discharge: Other (must enter comment), Continued Medical Work up, Inadequate or no insurance (awaiting bed at [facility])               Expected Discharge Plan and Services                                               Social Drivers of Health (SDOH) Interventions SDOH Screenings   Food Insecurity: No Food Insecurity (07/16/2024)  Recent Concern: Food Insecurity - Food Insecurity Present (04/20/2024)  Housing: Low Risk (07/16/2024)  Transportation Needs: No Transportation Needs (07/16/2024)  Utilities: Not At Risk (07/16/2024)  Depression (PHQ2-9): Low Risk (06/30/2024)  Recent Concern: Depression (PHQ2-9) - Medium Risk (05/07/2024)  Social Connections: Moderately Integrated (07/13/2024)  Tobacco Use: Medium Risk (09/07/2024)    Readmission Risk Interventions    07/14/2024    2:34 PM 05/04/2024   10:00 AM  Readmission Risk Prevention Plan  Transportation Screening Complete Complete  PCP or Specialist Appt within 3-5 Days  Complete  HRI or Home Care Consult  Complete  Social Work Consult for Recovery Care Planning/Counseling  Complete  Palliative Care Screening  Not Applicable  Medication Review Oceanographer) Complete Complete  PCP or Specialist appointment within 3-5 days of discharge Complete   HRI or Home Care Consult Complete   SW Recovery Care/Counseling Consult Complete   Palliative Care Screening Not Applicable   Skilled  Nursing Facility Not Applicable

## 2024-09-25 NOTE — Progress Notes (Signed)
 " PROGRESS NOTE  Brenda [name]  FMW:[id number] DOB: 1961/03/14 DOA: 07/15/2024 PCP: [facility]   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  Transferred from [facility] to home for suspicion of seizures.  Started on Keppra .  Also received empiric antibiotics for concern of meningitis/encephalitis but CSF panel was negative so antibiotics discontinued underwent treatment with high-dose steroids followed by plasmapheresis.  Neurology was following.  Long length of stay.  Attempted to transfer to higher centers, declined.  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  Neurology was following.  Infectious etiology ruled out, antibiotics discontinued.  Concern for seizures was started on Keppra .  Also underwent treatment with steroids, plasmapheresis.  Hospital course remarkable for worsening catatonia/stiffness.  MRI showed concerns for  evolving infarct.  There was also concern for neurotoxicity secondary to Keytruda  so it was discontinued. -Status post PLEX x 5 days completed Dec 12/28 -Status post high-dose steroids 12/9 - 12/11 -Now on Keppra  500 mg twice daily - Repeat EEG unremarkable Attempted to transfer her to [facility] on 1/16 for second opinion, declined. Initially accepted for transfer to [facility] by Dr. Reta but their neurology team called again on 1/22 discussed the case in detail with Dr Voncile.  Eventually was declined. patient does not need any further neurology workup from their standpoint therefore declined transfer.  Left scalp ring lesion Vesicular rash around left back/axilla VZV infection Concern for tinea corporis, started on terbinafine , plan for 2 weeks of treatment.  Completion of therapy on 1/29 Seen by ID.  HSV PCR negative, VZV positive.  Currently on valacyclovir , last day of treatment on 1/25 Continue with airborne precaution until all lesions have scabbed over.  History of endometrial cancer/cancer-related pain: Seen by oncology, urgent care.  There is no clear evidence of progression of metastatic carcinoma as per imagings.  Keytruda  on hold.  Continue pain regimen  History of DVT/PE/splenic infarct: Anticoagulation on hold due to psoas hematoma, GI bleed.  Currently only on DVT prophylaxis.  Right hand/wrist swelling: Improved.  Continue elevation  GI bleed/acute on chronic anemia: Secondary to malignancy and also GI bleed.  Had bright blood red per rectum which has resolved.  CT GI bleed negative.  Required blood transfusion during this hospitalization.  History of Klebsiella UTI: Completed antibiotics course.  Dysphagia/moderate malnutrition: Placed PEG by IR on 1/6.  Continue tube feeding  Diabetes type 2: Recent A1c of 7.2.  Continue current insulin  regimen.  Hypoglycemic this morning so dose of Lantus  reduced  History of depression/anxiety: On BuSpar , Ativan  as needed.  Prozac   discontinued due to rigidity  History of coronary artery disease/stroke/hyperlipidemia: On Lipitor .  Anticoagulation on hold  Abnormal thyroid  function test:Initially low TSH and elevated free T4, was started on propranolol . Repeat levels have normalized   Debility/deconditioning/disposition: PT/OT recommending SNF on discharge.  TOC following.  Difficulty in placement.       Nutrition Problem: Moderate Malnutrition Etiology: chronic illness (cancer) Wound 08/11/24 0415 Pressure Injury Vertebral column Medial Stage 2 -  Partial thickness loss of dermis presenting as a shallow open injury with a red, pink wound bed without slough. (Active)     Wound 08/31/24 2100 Pressure Injury Thigh Left;Posterior Stage 1 -  Intact skin with non-blanchable redness of a localized area usually over a bony prominence. (Active)    DVT prophylaxis:enoxaparin  (LOVENOX ) injection 40 mg Start: 09/10/24 1000 Place and maintain sequential compression device Start: 08/02/24 0738     Code Status: Limited: Do not attempt resuscitation (DNR) -DNR-LIMITED -Do Not Intubate/DNI   Family Communication: None at the bedside  Patient status:Inpatient  Patient is from :home  Anticipated discharge to:SNF  Estimated DC date:not sure   Consultants: Neurology, oncology, palliative care  Procedures: Plex  Antimicrobials:  Anti-infectives (From admission, onward)    Start     Dose/Rate Route Frequency Ordered Stop   09/18/24 1930  valACYclovir  (VALTREX ) tablet 1,000 mg        1,000 mg Oral 3 times daily 09/18/24 1756 09/25/24 2159   09/18/24 0915  fluconazole  (DIFLUCAN ) tablet 150 mg        150 mg Oral  Once 09/18/24 0820 09/18/24 1037   09/14/24 1730  fluconazole  (DIFLUCAN ) tablet 150 mg        150 mg Per Tube  Once 09/14/24 1643 09/14/24 1810   09/09/24 0800  cefTRIAXone  (ROCEPHIN ) 2 g in sodium chloride  0.9 % 100 mL IVPB        2 g 200 mL/hr over 30 Minutes Intravenous Every 24 hours 09/09/24 0624  09/12/24 0818   09/08/24 0831  ceFAZolin  (ANCEF ) IVPB 2g/100 mL premix        over 30 Minutes Intravenous Continuous PRN 09/08/24 0831 09/08/24 0856   08/16/24 1100  cefTRIAXone  (ROCEPHIN ) 2 g in sodium chloride  0.9 % 100 mL IVPB        2 g 200 mL/hr over 30 Minutes Intravenous Every 24 hours 08/16/24 1001 08/20/24 1214   07/23/24 0600  vancomycin  (VANCOCIN ) IVPB 1000 mg/200 mL premix  Status:  Discontinued        1,000 mg 200 mL/hr over 60 Minutes Intravenous Every 12 hours 07/22/24 1834 07/24/24 1808   07/22/24 2200  meropenem  (MERREM ) 2 g in sodium chloride  0.9 % 100 mL IVPB  Status:  Discontinued        2 g 280 mL/hr over 30 Minutes Intravenous Every 8 hours 07/22/24 1601 07/24/24 1808   07/22/24 1700  vancomycin  (VANCOREADY) IVPB 1500 mg/300 mL        1,500 mg 150 mL/hr over 120 Minutes Intravenous  Once 07/22/24 1601 07/22/24 2031   07/22/24 1645  ampicillin  (OMNIPEN) 2 g in sodium chloride  0.9 % 100 mL IVPB  Status:  Discontinued        2 g 300 mL/hr over 20 Minutes Intravenous Every 6 hours 07/22/24 1557 07/24/24 1800   07/19/24 0530  meropenem  (MERREM ) 1 g in sodium chloride  0.9 % 100 mL IVPB  Status:  Discontinued  1 g 200 mL/hr over 30 Minutes Intravenous Every 8 hours 07/19/24 0438 07/22/24 1601   07/16/24 0900  ceFEPIme  (MAXIPIME ) 2 g in sodium chloride  0.9 % 100 mL IVPB  Status:  Discontinued        2 g 200 mL/hr over 30 Minutes Intravenous Every 8 hours 07/16/24 0811 07/19/24 0409   07/16/24 0815  ceFEPIme  (MAXIPIME ) 2 g in sodium chloride  0.9 % 100 mL IVPB  Status:  Discontinued        2 g 200 mL/hr over 30 Minutes Intravenous  Once 07/16/24 0803 07/16/24 0811   07/16/24 0330  cefTRIAXone  (ROCEPHIN ) 2 g in sodium chloride  0.9 % 100 mL IVPB        2 g 200 mL/hr over 30 Minutes Intravenous  Once 07/16/24 0315 07/16/24 0412       Subjective: Patient seen and examined at bedside today.  Hemodynamically stable.  Lying on bed.  Blood sugars running low this morning.   She appears very weak and deconditioned, mostly nonverbal, does not obey commands.  Has Foley catheter, PEG tube.  On 2 L of oxygen  per minute.  Not in any acute respiratory distress.  Objective: Vitals:   09/24/24 0845 09/24/24 1147 09/24/24 1635 09/25/24 0358  BP: (!) 177/82 (!) 156/99 (!) 162/83 (!) 158/74  Pulse: 93 98 95 92  Resp: 16 15 20 15   Temp: 98.8 F (37.1 C) 98.7 F (37.1 C) 98.4 F (36.9 C) 98.3 F (36.8 C)  TempSrc: Axillary Axillary Axillary Axillary  SpO2: 97% 99% 99% 97%  Weight:      Height:        Intake/Output Summary (Last 24 hours) at 09/25/2024 0746 Last data filed at 09/25/2024 0655 Gross per 24 hour  Intake 500 ml  Output 950 ml  Net -450 ml   Filed Weights   09/07/24 0430 09/08/24 0500 09/19/24 0255  Weight: 87.1 kg 84.5 kg 85.7 kg    Examination:  General exam: Lying in bed, obese, deconditioned, chronically ill looking HEENT: PERRL Respiratory system:  no wheezes or crackles, diminished breath sounds on bilateral bases Cardiovascular system: S1 & S2 heard, RRR.  Port-A-Cath on the right chest Gastrointestinal system: Abdomen is nondistended, soft and nontender.  PEG tube  Central nervous system: Awake but not alert or oriented Extremities: No edema, no clubbing ,no cyanosis, weakness in all 4 extremities Skin: Varicella  on the back, minor skin breakdowns GU: Foley   Data Reviewed: I have personally reviewed following labs and imaging studies  CBC: Recent Labs  Lab 09/22/24 0510 09/23/24 0443  WBC 8.8 7.2  HGB 11.8* 9.9*  HCT 35.6* 30.5*  MCV 90.8 92.7  PLT 239 217   Basic Metabolic Panel: Recent Labs  Lab 09/21/24 0440 09/22/24 0510 09/23/24 0443  NA 133* 136 133*  K 4.1 3.8 4.2  CL 97* 96* 95*  CO2 31 30 31   GLUCOSE 245* 175* 427*  BUN 15 16 17   CREATININE 0.39* 0.42* 0.42*  CALCIUM  9.2 9.7 8.9  MG 1.7 1.8  --   PHOS  --  3.8  --      Recent Results (from the past 240 hours)  Varicella-zoster by PCR     Status:  Abnormal   Collection Time: 09/19/24 11:18 AM   Specimen: Lesion; Sterile Swab  Result Value Ref Range Status   Varicella-Zoster, PCR Positive (A) Negative Final    Comment: (NOTE) Varicella Zoster Virus DNA detected. This test was developed and its performance characteristics determined  by [facility]. It has not been cleared or approved by the Food and Drug Administration. Performed At: [facility] [address] [place], [place] [id number] Jennette Shorter MD Ey:[phone]      Radiology Studies: MR BRAIN W WO CONTRAST Result Date: 09/25/2024 EXAM: MRI BRAIN WITH AND WITHOUT CONTRAST 09/24/2024 10:19:23 PM TECHNIQUE: Multiplanar multisequence MRI of the head/brain was performed with and without the administration of intravenous contrast. CONTRAST: 8.5 mL of gadobutrol  (GADAVIST ) 1 MMOL/ML injection. COMPARISON: MR Head without and with intravenous contrast 08/09/2024. CLINICAL HISTORY: Mental status change, unknown cause; Stroke, follow up. FINDINGS: BRAIN AND VENTRICLES: Unchanged mild restricted diffusion in the anterior and posterior circulation including in bilateral frontoparietal white matter and in the right occipital lobe with developing encephalomalacia. No new/interval acute infarct. No acute intracranial hemorrhage. No mass effect or midline shift. No hydrocephalus. The sella is unremarkable. Normal flow voids. No abnormal enhancement. ORBITS: No significant abnormality. SINUSES: No significant abnormality. BONES AND SOFT TISSUES: Normal bone marrow signal. No soft tissue abnormality. IMPRESSION: 1. Unchanged evolving infarcts. 2. No interval acute abnormality. Electronically signed by: Glendia Molt MD 09/25/2024 01:21 AM EST RP Workstation: [id number]    Scheduled Meds:  atorvastatin   80 mg Per Tube QHS   busPIRone   5 mg Oral BID   Chlorhexidine  Gluconate Cloth  6 each Topical Daily   enoxaparin  (LOVENOX ) injection  40 mg Subcutaneous Q24H   famotidine   20 mg Per Tube BID    feeding supplement (GLUCERNA 1.5 CAL)  237 mL Per Tube TID WC & HS   feeding supplement (PROSource TF20)  60 mL Per Tube Daily   fentaNYL   1 patch Transdermal Q72H   FLUoxetine   40 mg Per Tube Daily   free water   100 mL Per Tube 5 X Daily   gabapentin   200 mg Per Tube BID   insulin  aspart  0-5 Units Subcutaneous QHS   insulin  aspart  0-9 Units Subcutaneous TID WC   insulin  aspart  7 Units Subcutaneous TID WC   insulin  glargine  30 Units Subcutaneous BID   levETIRAcetam   500 mg Per Tube BID   lidocaine   1 patch Transdermal Daily   nystatin   100,000 Units Topical TID   mouth rinse  15 mL Mouth Rinse 4 times per day   oxyCODONE   5 mg Per Tube BID   polyethylene glycol  17 g Per Tube Daily   sodium chloride  flush  10-40 mL Intracatheter Q12H   terbinafine   1 Application Topical BID   valACYclovir   1,000 mg Oral TID   Continuous Infusions:   LOS: 70 days   Ivonne Mustache, MD [facility] P1/23/2026, 7:46 AM  "

## 2024-09-26 DIAGNOSIS — G9341 Metabolic encephalopathy: Secondary | ICD-10-CM | POA: Diagnosis not present

## 2024-09-26 LAB — GLUCOSE, CAPILLARY
Glucose-Capillary: 274 mg/dL — ABNORMAL HIGH (ref 70–99)
Glucose-Capillary: 262 mg/dL — ABNORMAL HIGH (ref 70–99)
Glucose-Capillary: 237 mg/dL — ABNORMAL HIGH (ref 70–99)
Glucose-Capillary: 336 mg/dL — ABNORMAL HIGH (ref 70–99)
Glucose-Capillary: 474 mg/dL — ABNORMAL HIGH (ref 70–99)
Glucose-Capillary: 329 mg/dL — ABNORMAL HIGH (ref 70–99)

## 2024-09-26 LAB — BASIC METABOLIC PANEL WITH GFR
Anion gap: 7 (ref 5–15)
BUN: 13 mg/dL (ref 8–23)
CO2: 31 mmol/L (ref 22–32)
Calcium: 9.3 mg/dL (ref 8.9–10.3)
Chloride: 95 mmol/L — ABNORMAL LOW (ref 98–111)
Creatinine, Ser: 0.38 mg/dL — ABNORMAL LOW (ref 0.44–1.00)
GFR, Estimated: >60 mL/min
Glucose, Bld: 302 mg/dL — ABNORMAL HIGH (ref 70–99)
Potassium: 4.3 mmol/L (ref 3.5–5.1)
Sodium: 133 mmol/L — ABNORMAL LOW (ref 135–145)

## 2024-09-26 LAB — CBC
HCT: 32.8 % — ABNORMAL LOW (ref 36.0–46.0)
Hemoglobin: 10.9 g/dL — ABNORMAL LOW (ref 12.0–15.0)
MCH: 30.2 pg (ref 26.0–34.0)
MCHC: 33.2 g/dL (ref 30.0–36.0)
MCV: 90.9 fL (ref 80.0–100.0)
Platelets: 298 10*3/uL (ref 150–400)
RBC: 3.61 MIL/uL — ABNORMAL LOW (ref 3.87–5.11)
RDW: 14.9 % (ref 11.5–15.5)
WBC: 7.6 10*3/uL (ref 4.0–10.5)
nRBC: 0 % (ref 0.0–0.2)

## 2024-09-26 MED ORDER — INSULIN ASPART 100 UNIT/ML IJ SOLN
0.0000 [IU] | Freq: Three times a day (TID) | INTRAMUSCULAR | Status: DC
  Administered 2024-09-27: 5 [IU] via SUBCUTANEOUS
  Administered 2024-09-27: 11 [IU] via SUBCUTANEOUS
  Administered 2024-09-27: 5 [IU] via SUBCUTANEOUS
  Administered 2024-09-28: 11 [IU] via SUBCUTANEOUS
  Administered 2024-09-28: 2 [IU] via SUBCUTANEOUS
  Administered 2024-09-28: 5 [IU] via SUBCUTANEOUS
  Administered 2024-09-29: 8 [IU] via SUBCUTANEOUS
  Administered 2024-09-29: 15 [IU] via SUBCUTANEOUS
  Administered 2024-09-29: 5 [IU] via SUBCUTANEOUS
  Administered 2024-09-30: 2 [IU] via SUBCUTANEOUS
  Filled 2024-09-26 (×2): qty 1
  Filled 2024-09-26: qty 9
  Filled 2024-09-26 (×2): qty 1
  Filled 2024-09-26: qty 2
  Filled 2024-09-26 (×4): qty 1

## 2024-09-26 MED ORDER — INSULIN ASPART 100 UNIT/ML IJ SOLN
5.0000 [IU] | Freq: Three times a day (TID) | INTRAMUSCULAR | Status: DC
  Administered 2024-09-27 – 2024-10-08 (×29): 5 [IU] via SUBCUTANEOUS
  Filled 2024-09-26: qty 5
  Filled 2024-09-26: qty 1
  Filled 2024-09-26: qty 5
  Filled 2024-09-26 (×3): qty 1
  Filled 2024-09-26 (×2): qty 5
  Filled 2024-09-26 (×2): qty 1
  Filled 2024-09-26: qty 5
  Filled 2024-09-26 (×2): qty 1
  Filled 2024-09-26: qty 5
  Filled 2024-09-26: qty 1
  Filled 2024-09-26: qty 5
  Filled 2024-09-26 (×3): qty 1
  Filled 2024-09-26: qty 5
  Filled 2024-09-26 (×3): qty 1
  Filled 2024-09-26: qty 5
  Filled 2024-09-26 (×3): qty 1
  Filled 2024-09-26: qty 5
  Filled 2024-09-26 (×3): qty 1

## 2024-09-26 NOTE — Progress Notes (Addendum)
 " PROGRESS NOTE  Brenda [name]  FMW:[id number] DOB: 28-Oct-1960 DOA: 07/15/2024 PCP: [facility]   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  Transferred from [facility] to home for suspicion of seizures.  Started on Keppra .  Also received empiric antibiotics for concern of meningitis/encephalitis but CSF panel was negative so antibiotics discontinued underwent treatment with high-dose steroids followed by plasmapheresis.  Neurology was following.  Long length of stay.  Attempted to transfer to higher centers, declined.  PT/OT recommending SNF on discharge.LLOS.  TOC following.  Can be discharged to SNF if placement found.  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  Neurology was following.  Infectious etiology ruled out, antibiotics discontinued.  Concern for seizures was started on Keppra .  Also underwent treatment with steroids, plasmapheresis.  Hospital course remarkable  for worsening catatonia/stiffness.  MRI showed concerns for evolving infarct.  There was also concern for neurotoxicity secondary to Keytruda  so it was discontinued. -Status post PLEX x 5 days completed Dec 12/28 -Status post high-dose steroids 12/9 - 12/11 -Now on Keppra  500 mg twice daily - Repeat EEG unremarkable Attempted to transfer her to [facility] on 1/16 for second opinion, declined. Initially accepted for transfer to [facility] by Dr. Reta but their neurology team called again on 1/22 discussed the case in detail with Dr Voncile.  Eventually was declined. patient does not need any further neurology workup from their standpoint therefore declined transfer.  Left scalp ring lesion Vesicular rash around left back/axilla VZV infection Concern for tinea corporis, started on terbinafine , plan for 2 weeks of treatment.  Completion of therapy on 1/29 Seen by ID.  HSV PCR negative, VZV positive.  Currently on valacyclovir , last day of treatment on 1/25 Continue with airborne precaution until all lesions have scabbed over.  History of endometrial cancer/cancer-related pain: Seen by oncology, urgent care.  There is no clear evidence of progression of metastatic carcinoma as per imagings.  Keytruda  on hold.  Continue pain regimen  History of DVT/PE/splenic infarct: Anticoagulation on hold due to psoas hematoma, GI bleed.  Currently only on DVT prophylaxis.  Right hand/wrist swelling: Improved.  Continue elevation  GI bleed/acute on chronic anemia: Secondary to malignancy and also GI bleed.  Had bright blood red per rectum which has resolved.  CT GI bleed negative.  Required blood transfusion during this hospitalization.  History of Klebsiella UTI: Completed antibiotics course.  Dysphagia/moderate malnutrition: Placed PEG by IR on 1/6.  Continue tube feeding  Diabetes type 2: Recent A1c of 7.2.  Continue current  insulin  regimen.  Hypoglycemic on 1/23, so dose of Lantus  reduced.  Diabetic coordinator  following  History of depression/anxiety: On BuSpar , Ativan  as needed.  Prozac  discontinued due to rigidity  History of coronary artery disease/stroke/hyperlipidemia: On Lipitor .  Anticoagulation on hold  Abnormal thyroid  function test:Initially low TSH and elevated free T4, was started on propranolol . Repeat levels have normalized   Debility/deconditioning/disposition: PT/OT recommending SNF on discharge.  TOC following.  Difficulty in placement.       Nutrition Problem: Moderate Malnutrition Etiology: chronic illness (cancer) Wound 08/11/24 0415 Pressure Injury Vertebral column Medial Stage 2 -  Partial thickness loss of dermis presenting as a shallow open injury with a red, pink wound bed without slough. (Active)     Wound 08/31/24 2100 Pressure Injury Thigh Left;Posterior Stage 1 -  Intact skin with non-blanchable redness of a localized area usually over a bony prominence. (Active)    DVT prophylaxis:enoxaparin  (LOVENOX ) injection 40 mg Start: 09/10/24 1000 Place and maintain sequential compression device Start: 08/02/24 0738     Code Status: Limited: Do not attempt resuscitation (DNR) -DNR-LIMITED -Do Not Intubate/DNI   Family Communication: Called and discussed with husband [name] on phone on 1/24  Patient status:Inpatient  Patient is from :home  Anticipated discharge to:SNF  Estimated DC date:whenever possible   Consultants: Neurology, oncology, palliative care  Procedures: Plex  Antimicrobials:  Anti-infectives (From admission, onward)    Start     Dose/Rate Route Frequency Ordered Stop   09/18/24 1930  valACYclovir  (VALTREX ) tablet 1,000 mg        1,000 mg Oral 3 times daily 09/18/24 1756 09/25/24 1605   09/18/24 0915  fluconazole  (DIFLUCAN ) tablet 150 mg        150 mg Oral  Once 09/18/24 0820 09/18/24 1037   09/14/24 1730  fluconazole  (DIFLUCAN ) tablet 150 mg        150 mg Per Tube  Once 09/14/24 1643 09/14/24 1810   09/09/24 0800  cefTRIAXone   (ROCEPHIN ) 2 g in sodium chloride  0.9 % 100 mL IVPB        2 g 200 mL/hr over 30 Minutes Intravenous Every 24 hours 09/09/24 0624 09/12/24 0818   09/08/24 0831  ceFAZolin  (ANCEF ) IVPB 2g/100 mL premix        over 30 Minutes Intravenous Continuous PRN 09/08/24 0831 09/08/24 0856   08/16/24 1100  cefTRIAXone  (ROCEPHIN ) 2 g in sodium chloride  0.9 % 100 mL IVPB        2 g 200 mL/hr over 30 Minutes Intravenous Every 24 hours 08/16/24 1001 08/20/24 1214   07/23/24 0600  vancomycin  (VANCOCIN ) IVPB 1000 mg/200 mL premix  Status:  Discontinued        1,000 mg 200 mL/hr over 60 Minutes Intravenous Every 12 hours 07/22/24 1834 07/24/24 1808   07/22/24 2200  meropenem  (MERREM ) 2 g in sodium chloride  0.9 % 100 mL IVPB  Status:  Discontinued        2 g 280 mL/hr over 30 Minutes Intravenous Every 8 hours 07/22/24 1601 07/24/24 1808   07/22/24 1700  vancomycin  (VANCOREADY) IVPB 1500 mg/300 mL        1,500 mg 150 mL/hr over 120 Minutes Intravenous  Once 07/22/24 1601 07/22/24 2031   07/22/24 1645  ampicillin  (OMNIPEN) 2 g in sodium chloride  0.9 % 100 mL IVPB  Status:  Discontinued        2 g 300 mL/hr over 20 Minutes Intravenous Every 6 hours 07/22/24 1557 07/24/24 1800   07/19/24 0530  meropenem  (  MERREM ) 1 g in sodium chloride  0.9 % 100 mL IVPB  Status:  Discontinued        1 g 200 mL/hr over 30 Minutes Intravenous Every 8 hours 07/19/24 0438 07/22/24 1601   07/16/24 0900  ceFEPIme  (MAXIPIME ) 2 g in sodium chloride  0.9 % 100 mL IVPB  Status:  Discontinued        2 g 200 mL/hr over 30 Minutes Intravenous Every 8 hours 07/16/24 0811 07/19/24 0409   07/16/24 0815  ceFEPIme  (MAXIPIME ) 2 g in sodium chloride  0.9 % 100 mL IVPB  Status:  Discontinued        2 g 200 mL/hr over 30 Minutes Intravenous  Once 07/16/24 0803 07/16/24 0811   07/16/24 0330  cefTRIAXone  (ROCEPHIN ) 2 g in sodium chloride  0.9 % 100 mL IVPB        2 g 200 mL/hr over 30 Minutes Intravenous  Once 07/16/24 0315 07/16/24 0412        Subjective: Patient seen and examined at bedside today.  Hemodynamically stable.  Lying in bed.  Same as yesterday.  Moaning.  Might be in pain but she could not express.  Heart rate, blood pressure stable.  On 2 L of oxygen  per minute.  Not in  any kind of respiratory distress  Objective: Vitals:   09/26/24 0315 09/26/24 0525 09/26/24 0535 09/26/24 0723  BP: (!) 142/52 (!) 128/48  (!) 145/50  Pulse: 88 87  83  Resp:    16  Temp:   98.7 F (37.1 C) 98.7 F (37.1 C)  TempSrc:   Oral Axillary  SpO2: 99% 100%  99%  Weight:      Height:        Intake/Output Summary (Last 24 hours) at 09/26/2024 1039 Last data filed at 09/26/2024 0725 Gross per 24 hour  Intake 637 ml  Output 1800 ml  Net -1163 ml   Filed Weights   09/07/24 0430 09/08/24 0500 09/19/24 0255  Weight: 87.1 kg 84.5 kg 85.7 kg    Examination:   General exam: Chronically ill looking, deconditioned, obese, lying on bed HEENT: PERRL Respiratory system:  no wheezes or crackles, diminished sounds bilaterally Cardiovascular system: S1 & S2 heard, RRR.  Port-A-Cath on the right chest Gastrointestinal system: Abdomen is nondistended, soft and nontender.  PEG tube Central nervous system: Awake but not alert or oriented Extremities: No edema, no clubbing ,no cyanosis, global weakness Skin: Varicella rash on the back, minor skin breakdowns GU: Foley   Data Reviewed: I have personally reviewed following labs and imaging studies  CBC: Recent Labs  Lab 09/22/24 0510 09/23/24 0443 09/26/24 0500  WBC 8.8 7.2 7.6  HGB 11.8* 9.9* 10.9*  HCT 35.6* 30.5* 32.8*  MCV 90.8 92.7 90.9  PLT 239 217 298   Basic Metabolic Panel: Recent Labs  Lab 09/21/24 0440 09/22/24 0510 09/23/24 0443 09/26/24 0500  NA 133* 136 133* 133*  K 4.1 3.8 4.2 4.3  CL 97* 96* 95* 95*  CO2 31 30 31 31   GLUCOSE 245* 175* 427* 302*  BUN 15 16 17 13   CREATININE 0.39* 0.42* 0.42* 0.38*  CALCIUM  9.2 9.7 8.9 9.3  MG 1.7 1.8  --   --   PHOS   --  3.8  --   --      Recent Results (from the past 240 hours)  Varicella-zoster by PCR     Status: Abnormal   Collection Time: 09/19/24 11:18 AM   Specimen: Lesion; Sterile Swab  Result Value  Ref Range Status   Varicella-Zoster, PCR Positive (A) Negative Final    Comment: (NOTE) Varicella Zoster Virus DNA detected. This test was developed and its performance characteristics determined by [facility]. It has not been cleared or approved by the Food and Drug Administration. Performed At: [facility] [address] [place], [place] [id number] Jennette Shorter MD Ey:[phone]      Radiology Studies: MR BRAIN W WO CONTRAST Result Date: 09/25/2024 EXAM: MRI BRAIN WITH AND WITHOUT CONTRAST 09/24/2024 10:19:23 PM TECHNIQUE: Multiplanar multisequence MRI of the head/brain was performed with and without the administration of intravenous contrast. CONTRAST: 8.5 mL of gadobutrol  (GADAVIST ) 1 MMOL/ML injection. COMPARISON: MR Head without and with intravenous contrast 08/09/2024. CLINICAL HISTORY: Mental status change, unknown cause; Stroke, follow up. FINDINGS: BRAIN AND VENTRICLES: Unchanged mild restricted diffusion in the anterior and posterior circulation including in bilateral frontoparietal white matter and in the right occipital lobe with developing encephalomalacia. No new/interval acute infarct. No acute intracranial hemorrhage. No mass effect or midline shift. No hydrocephalus. The sella is unremarkable. Normal flow voids. No abnormal enhancement. ORBITS: No significant abnormality. SINUSES: No significant abnormality. BONES AND SOFT TISSUES: Normal bone marrow signal. No soft tissue abnormality. IMPRESSION: 1. Unchanged evolving infarcts. 2. No interval acute abnormality. Electronically signed by: Glendia Molt MD 09/25/2024 01:21 AM EST RP Workstation: [id number]    Scheduled Meds:  atorvastatin   80 mg Per Tube QHS   busPIRone   5 mg Oral BID   Chlorhexidine  Gluconate Cloth  6 each  Topical Daily   enoxaparin  (LOVENOX ) injection  40 mg Subcutaneous Q24H   famotidine   20 mg Per Tube BID   feeding supplement (GLUCERNA 1.5 CAL)  237 mL Per Tube TID WC & HS   feeding supplement (PROSource TF20)  60 mL Per Tube Daily   fentaNYL   1 patch Transdermal Q72H   FLUoxetine   40 mg Per Tube Daily   free water   100 mL Per Tube 5 X Daily   gabapentin   200 mg Per Tube BID   insulin  aspart  0-5 Units Subcutaneous QHS   insulin  aspart  0-9 Units Subcutaneous TID WC   insulin  glargine  25 Units Subcutaneous Daily   levETIRAcetam   500 mg Per Tube BID   lidocaine   1 patch Transdermal Daily   nystatin   100,000 Units Topical TID   mouth rinse  15 mL Mouth Rinse 4 times per day   oxyCODONE   5 mg Per Tube BID   polyethylene glycol  17 g Per Tube Daily   sodium chloride  flush  10-40 mL Intracatheter Q12H   terbinafine   1 Application Topical BID   Continuous Infusions:   LOS: 71 days   Ivonne Mustache, MD [facility] P1/24/2026, 10:39 AM  "

## 2024-09-26 NOTE — TOC Progression Note (Signed)
 Transition of Care [facility]) - Progression Note    Patient Details  Name: Brenda [name] MRN: [id number] Date of Birth: 08/05/1961  Transition of [facility]) CM/SW Contact  Jaelynn Currier A Joselle Deeds, LCSW Phone Number: 09/26/2024, 11:10 AM  Clinical Narrative:     CSW notified by MD that pt was declined for hospital transfer. Plan for disposition to SNF. CSW to continue disposition planning with pt and pt's family.   CSW will continue to follow.   Expected Discharge Plan: Acute to Acute Transfer Barriers to Discharge: Other (must enter comment), Continued Medical Work up, Inadequate or no insurance (awaiting bed at [facility])               Expected Discharge Plan and Services                                               Social Drivers of Health (SDOH) Interventions SDOH Screenings   Food Insecurity: No Food Insecurity (07/16/2024)  Recent Concern: Food Insecurity - Food Insecurity Present (04/20/2024)  Housing: Low Risk (07/16/2024)  Transportation Needs: No Transportation Needs (07/16/2024)  Utilities: Not At Risk (07/16/2024)  Depression (PHQ2-9): Low Risk (06/30/2024)  Recent Concern: Depression (PHQ2-9) - Medium Risk (05/07/2024)  Social Connections: Moderately Integrated (07/13/2024)  Tobacco Use: Medium Risk (09/07/2024)    Readmission Risk Interventions    07/14/2024    2:34 PM 05/04/2024   10:00 AM  Readmission Risk Prevention Plan  Transportation Screening Complete Complete  PCP or Specialist Appt within 3-5 Days  Complete  HRI or Home Care Consult  Complete  Social Work Consult for Recovery Care Planning/Counseling  Complete  Palliative Care Screening  Not Applicable  Medication Review [employer]) Complete Complete  PCP or Specialist appointment within 3-5 days of discharge Complete   HRI or Home Care Consult Complete   SW Recovery Care/Counseling Consult Complete   Palliative Care Screening Not Applicable   Skilled Nursing Facility Not  Applicable

## 2024-09-27 DIAGNOSIS — G9341 Metabolic encephalopathy: Secondary | ICD-10-CM | POA: Diagnosis not present

## 2024-09-27 LAB — GLUCOSE, CAPILLARY
Glucose-Capillary: 320 mg/dL — ABNORMAL HIGH (ref 70–99)
Glucose-Capillary: 299 mg/dL — ABNORMAL HIGH (ref 70–99)
Glucose-Capillary: 249 mg/dL — ABNORMAL HIGH (ref 70–99)
Glucose-Capillary: 224 mg/dL — ABNORMAL HIGH (ref 70–99)
Glucose-Capillary: 221 mg/dL — ABNORMAL HIGH (ref 70–99)

## 2024-09-27 MED ORDER — INSULIN GLARGINE 100 UNIT/ML ~~LOC~~ SOLN
35.0000 [IU] | Freq: Every day | SUBCUTANEOUS | Status: DC
  Administered 2024-09-27 – 2024-10-08 (×12): 35 [IU] via SUBCUTANEOUS
  Filled 2024-09-27 (×12): qty 0.35

## 2024-09-27 NOTE — Plan of Care (Signed)
  Problem: Fluid Volume: Goal: Ability to maintain a balanced intake and output will improve Outcome: Progressing   Problem: Metabolic: Goal: Ability to maintain appropriate glucose levels will improve Outcome: Progressing   Problem: Nutritional: Goal: Maintenance of adequate nutrition will improve Outcome: Progressing Goal: Progress toward achieving an optimal weight will improve Outcome: Progressing   

## 2024-09-27 NOTE — Progress Notes (Signed)
 " PROGRESS NOTE  Brenda [name]  FMW:[id number] DOB: [birth year] DOA: 07/15/2024 PCP: [facility]   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  Transferred from [facility] to home for suspicion of seizures.  Started on Keppra .  Also received empiric antibiotics for concern of meningitis/encephalitis but CSF panel was negative so antibiotics discontinued underwent treatment with high-dose steroids followed by plasmapheresis.  Neurology was following.  Long length of stay.  Attempted to transfer to higher centers, declined.  PT/OT recommending SNF on discharge.LLOS.  TOC following.  Can be discharged to SNF if placement found.  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  Neurology was following.  Infectious etiology ruled out, antibiotics discontinued.  Concern for seizures was started on Keppra .  Also underwent treatment with steroids, plasmapheresis.  Hospital course remarkable  for worsening catatonia/stiffness.  MRI showed concerns for evolving infarct.  There was also concern for neurotoxicity secondary to Keytruda  so it was discontinued. -Status post PLEX x 5 days completed Dec 12/28 -Status post high-dose steroids 12/9 - 12/11 -Now on Keppra  500 mg twice daily - Repeat EEG unremarkable Attempted to transfer her to [facility] on 1/16 for second opinion, declined. Initially accepted for transfer to [facility] by Dr. Reta but their neurology team called again on 1/22 discussed the case in detail with Dr Voncile.  Eventually was declined. patient does not need any further neurology workup from their standpoint therefore declined transfer.  Left scalp ring lesion Vesicular rash around left back/axilla VZV infection Concern for tinea corporis, started on terbinafine , plan for 2 weeks of treatment.  Completion of therapy on 1/29 Seen by ID.  HSV PCR negative, VZV positive.  Currently on valacyclovir , last day of treatment on 1/25 Continue with airborne precaution until all lesions have scabbed over.  History of endometrial cancer/cancer-related pain: Seen by oncology, urgent care.  There is no clear evidence of progression of metastatic carcinoma as per imagings.  Keytruda  on hold.  Continue pain regimen  History of DVT/PE/splenic infarct: Anticoagulation on hold due to psoas hematoma, GI bleed.  Currently only on DVT prophylaxis.  Right hand/wrist swelling: Improved.  Continue elevation  GI bleed/acute on chronic anemia: Secondary to malignancy and also GI bleed.  Had bright blood red per rectum which has resolved.  CT GI bleed negative.  Required blood transfusion during this hospitalization.  History of Klebsiella UTI: Completed antibiotics course.  Dysphagia/moderate malnutrition: Placed PEG by IR on 1/6.  Continue tube feeding  Diabetes type 2: Recent A1c of 7.2.  Continue current  insulin  regimen.  Hypoglycemic on 1/23, so dose of Lantus  reduced.  Diabetic coordinator  following  History of depression/anxiety: On BuSpar , Ativan  as needed.  Prozac  discontinued due to rigidity  History of coronary artery disease/stroke/hyperlipidemia: On Lipitor .  Anticoagulation on hold  Abnormal thyroid  function test:Initially low TSH and elevated free T4, was started on propranolol . Repeat levels have normalized   Debility/deconditioning/disposition: PT/OT recommending SNF on discharge.  TOC following.  Difficulty in placement.       Nutrition Problem: Moderate Malnutrition Etiology: chronic illness (cancer) Wound 08/11/24 0415 Pressure Injury Vertebral column Medial Stage 2 -  Partial thickness loss of dermis presenting as a shallow open injury with a red, pink wound bed without slough. (Active)     Wound 08/31/24 2100 Pressure Injury Thigh Left;Posterior Stage 1 -  Intact skin with non-blanchable redness of a localized area usually over a bony prominence. (Active)    DVT prophylaxis:enoxaparin  (LOVENOX ) injection 40 mg Start: 09/10/24 1000 Place and maintain sequential compression device Start: 08/02/24 0738     Code Status: Limited: Do not attempt resuscitation (DNR) -DNR-LIMITED -Do Not Intubate/DNI   Family Communication: Called and discussed with husband [name] on phone on 1/24  Patient status:Inpatient  Patient is from :home  Anticipated discharge to:SNF  Estimated DC date:whenever possible   Consultants: Neurology, oncology, palliative care  Procedures: Plex  Antimicrobials:  Anti-infectives (From admission, onward)    Start     Dose/Rate Route Frequency Ordered Stop   09/18/24 1930  valACYclovir  (VALTREX ) tablet 1,000 mg        1,000 mg Oral 3 times daily 09/18/24 1756 09/25/24 1605   09/18/24 0915  fluconazole  (DIFLUCAN ) tablet 150 mg        150 mg Oral  Once 09/18/24 0820 09/18/24 1037   09/14/24 1730  fluconazole  (DIFLUCAN ) tablet 150 mg        150 mg Per Tube  Once 09/14/24 1643 09/14/24 1810   09/09/24 0800  cefTRIAXone   (ROCEPHIN ) 2 g in sodium chloride  0.9 % 100 mL IVPB        2 g 200 mL/hr over 30 Minutes Intravenous Every 24 hours 09/09/24 0624 09/12/24 0818   09/08/24 0831  ceFAZolin  (ANCEF ) IVPB 2g/100 mL premix        over 30 Minutes Intravenous Continuous PRN 09/08/24 0831 09/08/24 0856   08/16/24 1100  cefTRIAXone  (ROCEPHIN ) 2 g in sodium chloride  0.9 % 100 mL IVPB        2 g 200 mL/hr over 30 Minutes Intravenous Every 24 hours 08/16/24 1001 08/20/24 1214   07/23/24 0600  vancomycin  (VANCOCIN ) IVPB 1000 mg/200 mL premix  Status:  Discontinued        1,000 mg 200 mL/hr over 60 Minutes Intravenous Every 12 hours 07/22/24 1834 07/24/24 1808   07/22/24 2200  meropenem  (MERREM ) 2 g in sodium chloride  0.9 % 100 mL IVPB  Status:  Discontinued        2 g 280 mL/hr over 30 Minutes Intravenous Every 8 hours 07/22/24 1601 07/24/24 1808   07/22/24 1700  vancomycin  (VANCOREADY) IVPB 1500 mg/300 mL        1,500 mg 150 mL/hr over 120 Minutes Intravenous  Once 07/22/24 1601 07/22/24 2031   07/22/24 1645  ampicillin  (OMNIPEN) 2 g in sodium chloride  0.9 % 100 mL IVPB  Status:  Discontinued        2 g 300 mL/hr over 20 Minutes Intravenous Every 6 hours 07/22/24 1557 07/24/24 1800   07/19/24 0530  meropenem  (  MERREM ) 1 g in sodium chloride  0.9 % 100 mL IVPB  Status:  Discontinued        1 g 200 mL/hr over 30 Minutes Intravenous Every 8 hours 07/19/24 0438 07/22/24 1601   07/16/24 0900  ceFEPIme  (MAXIPIME ) 2 g in sodium chloride  0.9 % 100 mL IVPB  Status:  Discontinued        2 g 200 mL/hr over 30 Minutes Intravenous Every 8 hours 07/16/24 0811 07/19/24 0409   07/16/24 0815  ceFEPIme  (MAXIPIME ) 2 g in sodium chloride  0.9 % 100 mL IVPB  Status:  Discontinued        2 g 200 mL/hr over 30 Minutes Intravenous  Once 07/16/24 0803 07/16/24 0811   07/16/24 0330  cefTRIAXone  (ROCEPHIN ) 2 g in sodium chloride  0.9 % 100 mL IVPB        2 g 200 mL/hr over 30 Minutes Intravenous  Once 07/16/24 0315 07/16/24 0412        Subjective: Patient seen and examined at bedside today.  Hemodynamically stable .  Not in acute distress.  Lying on bed, moaning.  Not sure if she had pain.  Objective: Vitals:   09/26/24 1939 09/27/24 0026 09/27/24 0300 09/27/24 0819  BP: (!) 134/43 (!) 150/45 (!) 150/53 (!) 111/43  Pulse: 81 92 84 77  Resp: 10 11 19 14   Temp: 98.7 F (37.1 C) 98.3 F (36.8 C) 98.9 F (37.2 C) 98 F (36.7 C)  TempSrc: Oral Axillary Axillary Axillary  SpO2: 100% 100% 99% 100%  Weight:      Height:        Intake/Output Summary (Last 24 hours) at 09/27/2024 1036 Last data filed at 09/27/2024 0839 Gross per 24 hour  Intake 600 ml  Output 2250 ml  Net -1650 ml   Filed Weights   09/07/24 0430 09/08/24 0500 09/19/24 0255  Weight: 87.1 kg 84.5 kg 85.7 kg    Examination:  General exam: Chronically ill looking, weak, deconditioned, obese, lying on bed HEENT: PERRL Respiratory system:  no wheezes or crackles, diminished air sounds bilaterally Cardiovascular system: S1 & S2 heard, RRR.  Port-A-Cath on the right chest Gastrointestinal system: Abdomen is nondistended, soft and nontender.  PEG tube Central nervous system: Awake but not alert / oriented Extremities: No edema, no clubbing ,no cyanosis, global weakness Skin: varicella rash on the back GU: Foley   Data Reviewed: I have personally reviewed following labs and imaging studies  CBC: Recent Labs  Lab 09/22/24 0510 09/23/24 0443 09/26/24 0500  WBC 8.8 7.2 7.6  HGB 11.8* 9.9* 10.9*  HCT 35.6* 30.5* 32.8*  MCV 90.8 92.7 90.9  PLT 239 217 298   Basic Metabolic Panel: Recent Labs  Lab 09/21/24 0440 09/22/24 0510 09/23/24 0443 09/26/24 0500  NA 133* 136 133* 133*  K 4.1 3.8 4.2 4.3  CL 97* 96* 95* 95*  CO2 31 30 31 31   GLUCOSE 245* 175* 427* 302*  BUN 15 16 17 13   CREATININE 0.39* 0.42* 0.42* 0.38*  CALCIUM  9.2 9.7 8.9 9.3  MG 1.7 1.8  --   --   PHOS  --  3.8  --   --      Recent Results (from the past 240 hours)   Varicella-zoster by PCR     Status: Abnormal   Collection Time: 09/19/24 11:18 AM   Specimen: Lesion; Sterile Swab  Result Value Ref Range Status   Varicella-Zoster, PCR Positive (A) Negative Final    Comment: (NOTE) Varicella Zoster Virus DNA  detected. This test was developed and its performance characteristics determined by [facility]. It has not been cleared or approved by the Food and Drug Administration. Performed At: [facility] [address] [place], [place] [id number] Jennette Shorter MD Ey:[id number]      Radiology Studies: No results found.   Scheduled Meds:  atorvastatin   80 mg Per Tube QHS   busPIRone   5 mg Oral BID   Chlorhexidine  Gluconate Cloth  6 each Topical Daily   enoxaparin  (LOVENOX ) injection  40 mg Subcutaneous Q24H   famotidine   20 mg Per Tube BID   feeding supplement (GLUCERNA 1.5 CAL)  237 mL Per Tube TID WC & HS   feeding supplement (PROSource TF20)  60 mL Per Tube Daily   fentaNYL   1 patch Transdermal Q72H   FLUoxetine   40 mg Per Tube Daily   free water   100 mL Per Tube 5 X Daily   gabapentin   200 mg Per Tube BID   insulin  aspart  0-15 Units Subcutaneous TID WC   insulin  aspart  0-5 Units Subcutaneous QHS   insulin  aspart  5 Units Subcutaneous TID WC   insulin  glargine  35 Units Subcutaneous Daily   levETIRAcetam   500 mg Per Tube BID   lidocaine   1 patch Transdermal Daily   nystatin   100,000 Units Topical TID   mouth rinse  15 mL Mouth Rinse 4 times per day   oxyCODONE   5 mg Per Tube BID   polyethylene glycol  17 g Per Tube Daily   sodium chloride  flush  10-40 mL Intracatheter Q12H   terbinafine   1 Application Topical BID   Continuous Infusions:   LOS: 72 days   Ivonne Mustache, MD [facility] P1/25/2026, 10:36 AM  "

## 2024-09-28 DIAGNOSIS — G9341 Metabolic encephalopathy: Secondary | ICD-10-CM | POA: Diagnosis not present

## 2024-09-28 LAB — GLUCOSE, CAPILLARY
Glucose-Capillary: 316 mg/dL — ABNORMAL HIGH (ref 70–99)
Glucose-Capillary: 221 mg/dL — ABNORMAL HIGH (ref 70–99)
Glucose-Capillary: 140 mg/dL — ABNORMAL HIGH (ref 70–99)
Glucose-Capillary: 68 mg/dL — ABNORMAL LOW (ref 70–99)

## 2024-09-28 MED ORDER — DEXTROSE 50 % IV SOLN
INTRAVENOUS | Status: AC
  Administered 2024-09-28: 25 mL
  Filled 2024-09-28: qty 50

## 2024-09-28 NOTE — Plan of Care (Signed)
  Problem: Metabolic: Goal: Ability to maintain appropriate glucose levels will improve Outcome: Progressing   Problem: Skin Integrity: Goal: Risk for impaired skin integrity will decrease Outcome: Progressing   Problem: Tissue Perfusion: Goal: Adequacy of tissue perfusion will improve Outcome: Progressing   Problem: Education: Goal: Knowledge of General Education information will improve Description: Including pain rating scale, medication(s)/side effects and non-pharmacologic comfort measures Outcome: Progressing

## 2024-09-28 NOTE — Progress Notes (Signed)
 Nutrition Follow-up  DOCUMENTATION CODES:  Non-severe (moderate) malnutrition in context of chronic illness  INTERVENTION:  Continue bolus TF via PEG: Glucerna 1.5 bolus feeds, 4 cartons per day (948 ml per day)  Prosource TF20 1x/d Free water : 100 mL 5x/d (50mL before and after each bolus + 1 additional fluid bolus) Provides 1504 kcal, 98 gm protein, 820 ml free water  daily ( fee water  TF+flush)  Obtain updated weekly weights   NUTRITION DIAGNOSIS:  Moderate Malnutrition related to chronic illness (cancer) as evidenced by percent weight loss, mild muscle depletion, moderate muscle depletion (12% x 6 months). - remains applicable  GOAL:  Patient will meet greater than or equal to 90% of their needs - goal met via TF  MONITOR:  Weight trends, TF tolerance, I & O's, Labs, Diet advancement  REASON FOR ASSESSMENT:  Consult Enteral/tube feeding initiation and management  ASSESSMENT:  Pt with hx of HTN, HLD, CAD, CHF, hx CVA, uterine cancer s/p hysterectomy and adjuvant radiation therapy in 2023 with recurrance with mets in July 2025, hx MI x 3, and DM type 1 presented to [facility] ED with weakness after a recent admission from 11/9-11/11. Pt had seizures during admission and was transferred to [facility] for continuous EEG.  1/25: diet downgraded to dysphagia 2 per nursing staff  Patient has been declined for hospital transfer. Plan for dispo to SNF. CSW working on placement.  Pt remains hemodynamically stable.   RD working remotely. Pt remains unable to provide meaningful information. Flowsheet reflects pt is oriented to person.  Secure chat message sent to RN. RN reports pt continues to not take any oral nutrition. Last documented meal completion was on 1/18. PEG tube feeds continue to be primary source of nutritional intake.   Review of labs reflects CBG's to be widely variable.  Pt was having hypoglycemia episodes on 1/22 prompting adjustment of insulin  regimen. Now  pt experiencing hyperglycemia with adjustments to insulin  regimen made again yesterday.   Admit weight: 77.9 kg Current weight: 85.7 kg (last updated 1/17)  Medications: pepcid  BID, SSI 0-15 units TID, SSI 0-5 units at bedtime, novolog  5 units TID, lantus  35 units daily, miralax  daily  Labs:  CBG's 221-316 x24 hours  Diet Order:   Diet Order             DIET DYS 2 Room service appropriate? Yes; Fluid consistency: Thin  Diet effective now                   EDUCATION NEEDS:   Education needs have been addressed  Skin:  Skin Assessment: Reviewed RN Assessment  Last BM:  1/25 type 6 x 2  Height:   Ht Readings from Last 1 Encounters:  08/21/24 5' 2 (1.575 m)    Weight:   Wt Readings from Last 1 Encounters:  09/19/24 85.7 kg    Ideal Body Weight:  50 kg  BMI:  Body mass index is 34.57 kg/m.  Estimated Nutritional Needs:   Kcal:  1600-1800 kcal/d  Protein:  75-90 g/d  Fluid:  >/=1.6L/d  Allie Dondi Burandt, RDN, LDN Clinical Nutrition See AMiON for contact information.

## 2024-09-28 NOTE — Progress Notes (Signed)
 " PROGRESS NOTE  Brenda [name]  FMW:[id number] DOB: Mar 22, 1961 DOA: 07/15/2024 PCP: [facility]   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  Transferred from [facility] to home for suspicion of seizures.  Started on Keppra .  Also received empiric antibiotics for concern of meningitis/encephalitis but CSF panel was negative so antibiotics discontinued underwent treatment with high-dose steroids followed by plasmapheresis.  Neurology was following.  Long length of stay.  Attempted to transfer to higher centers, declined.  PT/OT recommending SNF on discharge.LLOS.  TOC following.  Can be discharged to SNF if placement found.  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  Neurology was following.  Infectious etiology ruled out, antibiotics discontinued.  Concern for seizures was started on Keppra .  Also underwent treatment with steroids, plasmapheresis.  Hospital course remarkable  for worsening catatonia/stiffness.  MRI showed concerns for evolving infarct.  There was also concern for neurotoxicity secondary to Keytruda  so it was discontinued. -Status post PLEX x 5 days completed Dec 12/28 -Status post high-dose steroids 12/9 - 12/11 -Now on Keppra  500 mg twice daily - Repeat EEG unremarkable Attempted to transfer her to [facility] on 1/16 for second opinion, declined. Initially accepted for transfer to [facility] by Dr. Reta but their neurology team called again on 1/22 discussed the case in detail with Dr Voncile.  Eventually was declined. patient does not need any further neurology workup from their standpoint therefore declined transfer.  No plan for transfer now.  Left scalp ring lesion Vesicular rash around left back/axilla VZV infection Concern for tinea corporis, started on terbinafine , plan for 2 weeks of treatment.  Completion of therapy on 1/29 Seen by ID.  HSV PCR negative, VZV positive.  Completed course of  valacyclovir , last day of treatment on 1/25 Continue with airborne precaution until all lesions have scabbed over.  History of endometrial cancer/cancer-related pain: Seen by oncology, urgent care.  There is no clear evidence of progression of metastatic carcinoma as per imagings.  Keytruda  on hold.  Continue pain regimen  History of DVT/PE/splenic infarct: Anticoagulation on hold due to psoas hematoma, GI bleed.  Currently only on DVT prophylaxis.  Right hand/wrist swelling: Improved.  Continue elevation  GI bleed/acute on chronic anemia: Secondary to malignancy and also GI bleed.  Had bright blood red per rectum which has resolved.  CT GI bleed negative.  Required blood transfusion during this hospitalization.  History of Klebsiella UTI: Completed antibiotics course.  Dysphagia/moderate malnutrition: Placed PEG by IR on 1/6.  Continue tube feeding  Diabetes type  2: Recent A1c of 7.2.  Continue current insulin  regimen.  Hypoglycemic on 1/23, so dose of  Lantus  reduced.  Diabetic coordinator following  History of depression/anxiety: On BuSpar , Ativan  as needed.  Prozac  discontinued due to rigidity  History of coronary artery disease/stroke/hyperlipidemia: On Lipitor .  Anticoagulation on hold  Abnormal thyroid  function test:Initially low TSH and elevated free T4, was started on propranolol . Repeat levels have normalized   Debility/deconditioning/disposition: PT/OT recommending SNF on discharge.  TOC following.  Difficulty in placement.       Nutrition Problem: Moderate Malnutrition Etiology: chronic illness (cancer) Wound 08/11/24 0415 Pressure Injury Vertebral column Medial Stage 2 -  Partial thickness loss of dermis presenting as a shallow open injury with a red, pink wound bed without slough. (Active)     Wound 08/31/24 2100 Pressure Injury Thigh Left;Posterior Stage 1 -  Intact skin with non-blanchable redness of a localized area usually over a bony prominence. (Active)    DVT prophylaxis:enoxaparin  (LOVENOX ) injection 40 mg Start: 09/10/24 1000 Place and maintain sequential compression device Start: 08/02/24 0738     Code Status: Limited: Do not attempt resuscitation (DNR) -DNR-LIMITED -Do Not Intubate/DNI   Family Communication: Called and discussed with husband [name] on phone on 1/24  Patient status:Inpatient  Patient is from :home  Anticipated discharge to:SNF  Estimated DC date:whenever possible.  Long length of stay   Consultants: Neurology, oncology, palliative care  Procedures: Plex  Antimicrobials:  Anti-infectives (From admission, onward)    Start     Dose/Rate Route Frequency Ordered Stop   09/18/24 1930  valACYclovir  (VALTREX ) tablet 1,000 mg        1,000 mg Oral 3 times daily 09/18/24 1756 09/25/24 1605   09/18/24 0915  fluconazole  (DIFLUCAN ) tablet 150 mg        150 mg Oral  Once 09/18/24 0820 09/18/24 1037   09/14/24 1730  fluconazole  (DIFLUCAN ) tablet 150 mg        150 mg Per Tube  Once  09/14/24 1643 09/14/24 1810   09/09/24 0800  cefTRIAXone  (ROCEPHIN ) 2 g in sodium chloride  0.9 % 100 mL IVPB        2 g 200 mL/hr over 30 Minutes Intravenous Every 24 hours 09/09/24 0624 09/12/24 0818   09/08/24 0831  ceFAZolin  (ANCEF ) IVPB 2g/100 mL premix        over 30 Minutes Intravenous Continuous PRN 09/08/24 0831 09/08/24 0856   08/16/24 1100  cefTRIAXone  (ROCEPHIN ) 2 g in sodium chloride  0.9 % 100 mL IVPB        2 g 200 mL/hr over 30 Minutes Intravenous Every 24 hours 08/16/24 1001 08/20/24 1214   07/23/24 0600  vancomycin  (VANCOCIN ) IVPB 1000 mg/200 mL premix  Status:  Discontinued        1,000 mg 200 mL/hr over 60 Minutes Intravenous Every 12 hours 07/22/24 1834 07/24/24 1808   07/22/24 2200  meropenem  (MERREM ) 2 g in sodium chloride  0.9 % 100 mL IVPB  Status:  Discontinued        2 g 280 mL/hr over 30 Minutes Intravenous Every 8 hours 07/22/24 1601 07/24/24 1808   07/22/24 1700  vancomycin  (VANCOREADY) IVPB 1500 mg/300 mL        1,500 mg 150 mL/hr over 120 Minutes Intravenous  Once 07/22/24 1601 07/22/24 2031   07/22/24 1645  ampicillin  (OMNIPEN) 2 g in sodium chloride  0.9 % 100 mL IVPB  Status:  Discontinued        2 g 300 mL/hr over 20 Minutes Intravenous  Every 6 hours 07/22/24 1557 07/24/24 1800   07/19/24 0530  meropenem  (MERREM ) 1 g in sodium chloride  0.9 % 100 mL IVPB  Status:  Discontinued        1 g 200 mL/hr over 30 Minutes Intravenous Every 8 hours 07/19/24 0438 07/22/24 1601   07/16/24 0900  ceFEPIme  (MAXIPIME ) 2 g in sodium chloride  0.9 % 100 mL IVPB  Status:  Discontinued        2 g 200 mL/hr over 30 Minutes Intravenous Every 8 hours 07/16/24 0811 07/19/24 0409   07/16/24 0815  ceFEPIme  (MAXIPIME ) 2 g in sodium chloride  0.9 % 100 mL IVPB  Status:  Discontinued        2 g 200 mL/hr over 30 Minutes Intravenous  Once 07/16/24 0803 07/16/24 0811   07/16/24 0330  cefTRIAXone  (ROCEPHIN ) 2 g in sodium chloride  0.9 % 100 mL IVPB        2 g 200 mL/hr over 30 Minutes  Intravenous  Once 07/16/24 0315 07/16/24 0412       Subjective: Patient seen and examined at bedside today.  Hemodynamically stable.  Lying in bed.  She appears more comfortable today.  Not moaning like yesterday.  As per the RN, she has good oral intake, tube feeding also running.  No significant new changes from yesterday.  Objective: Vitals:   09/27/24 2043 09/28/24 0055 09/28/24 0357 09/28/24 0925  BP: (!) 134/38 (!) 139/43 (!) 139/49 (!) 135/38  Pulse: 81 71 72 73  Resp: (!) 22 13 14 16   Temp: 99.3 F (37.4 C) 98.8 F (37.1 C) 98.8 F (37.1 C) 98 F (36.7 C)  TempSrc: Axillary Axillary Axillary Oral  SpO2: 98% 100% 100% 100%  Weight:      Height:        Intake/Output Summary (Last 24 hours) at 09/28/2024 1008 Last data filed at 09/28/2024 0816 Gross per 24 hour  Intake 500 ml  Output 1575 ml  Net -1075 ml   Filed Weights   09/07/24 0430 09/08/24 0500 09/19/24 0255  Weight: 87.1 kg 84.5 kg 85.7 kg    Examination:   General exam: Chronically ill looking, weak, deconditioned, obese, lying on bed HEENT: PERRL Respiratory system:  no wheezes or crackles, diminished sounds bilaterally Cardiovascular system: S1 & S2 heard, RRR.  Port-A-Cath on the right chest Gastrointestinal system: Abdomen is nondistended, soft and nontender.  Tube Central nervous system: Alert but not alert or oriented Extremities: No edema, no clubbing ,no cyanosis, global weakness, contracture of left upper extremity Skin: varicella rash on the back GU: Foley   Data Reviewed: I have personally reviewed following labs and imaging studies  CBC: Recent Labs  Lab 09/22/24 0510 09/23/24 0443 09/26/24 0500  WBC 8.8 7.2 7.6  HGB 11.8* 9.9* 10.9*  HCT 35.6* 30.5* 32.8*  MCV 90.8 92.7 90.9  PLT 239 217 298   Basic Metabolic Panel: Recent Labs  Lab 09/22/24 0510 09/23/24 0443 09/26/24 0500  NA 136 133* 133*  K 3.8 4.2 4.3  CL 96* 95* 95*  CO2 30 31 31   GLUCOSE 175* 427* 302*  BUN 16  17 13   CREATININE 0.42* 0.42* 0.38*  CALCIUM  9.7 8.9 9.3  MG 1.8  --   --   PHOS 3.8  --   --      Recent Results (from the past 240 hours)  Varicella-zoster by PCR     Status: Abnormal   Collection Time: 09/19/24 11:18 AM   Specimen: Lesion; Sterile Swab  Result Value Ref Range Status   Varicella-Zoster, PCR Positive (A) Negative Final    Comment: (NOTE) Varicella Zoster Virus DNA detected. This test was developed and its performance characteristics determined by [facility]. It has not been cleared or approved by the Food and Drug Administration. Performed At: [facility] [address] [place], [place] [id number] Jennette Shorter MD Ey:[id number]      Radiology Studies: No results found.   Scheduled Meds:  atorvastatin   80 mg Per Tube QHS   busPIRone   5 mg Oral BID   Chlorhexidine  Gluconate Cloth  6 each Topical Daily   enoxaparin  (LOVENOX ) injection  40 mg Subcutaneous Q24H   famotidine   20 mg Per Tube BID   feeding supplement (GLUCERNA 1.5 CAL)  237 mL Per Tube TID WC & HS   feeding supplement (PROSource TF20)  60 mL Per Tube Daily   fentaNYL   1 patch Transdermal Q72H   FLUoxetine   40 mg Per Tube Daily   free water   100 mL Per Tube 5 X Daily   gabapentin   200 mg Per Tube BID   insulin  aspart  0-15 Units Subcutaneous TID WC   insulin  aspart  0-5 Units Subcutaneous QHS   insulin  aspart  5 Units Subcutaneous TID WC   insulin  glargine  35 Units Subcutaneous Daily   levETIRAcetam   500 mg Per Tube BID   lidocaine   1 patch Transdermal Daily   nystatin   100,000 Units Topical TID   mouth rinse  15 mL Mouth Rinse 4 times per day   oxyCODONE   5 mg Per Tube BID   polyethylene glycol  17 g Per Tube Daily   sodium chloride  flush  10-40 mL Intracatheter Q12H   terbinafine   1 Application Topical BID   Continuous Infusions:   LOS: 73 days   Ivonne Mustache, MD [facility] P1/26/2026, 10:08 AM  "

## 2024-09-28 NOTE — TOC Progression Note (Signed)
 Transition of Care [facility]) - Progression Note    Patient Details  Name: Brenda [name] MRN: [id number] Date of Birth: 08-20-1961  [facility] CM/SW Contact  Montie LOISE Louder, [place] Phone Number: 09/28/2024, 2:40 PM  Clinical Narrative:     Pt has no bed offers   TOC will continue to follow and assist with discharge planning.   Montie Louder, MSW, LCSW Clinical Social Worker    Expected Discharge Plan: Acute to Acute Transfer Barriers to Discharge: Other (must enter comment), Continued Medical Work up, Inadequate or no insurance (awaiting bed at [facility])               Expected Discharge Plan and Services                                               Social Drivers of Health (SDOH) Interventions SDOH Screenings   Food Insecurity: No Food Insecurity (07/16/2024)  Recent Concern: Food Insecurity - Food Insecurity Present (04/20/2024)  Housing: Low Risk (07/16/2024)  Transportation Needs: No Transportation Needs (07/16/2024)  Utilities: Not At Risk (07/16/2024)  Depression (PHQ2-9): Low Risk (06/30/2024)  Recent Concern: Depression (PHQ2-9) - Medium Risk (05/07/2024)  Social Connections: Moderately Integrated (07/13/2024)  Tobacco Use: Medium Risk (09/07/2024)    Readmission Risk Interventions    07/14/2024    2:34 PM 05/04/2024   10:00 AM  Readmission Risk Prevention Plan  Transportation Screening Complete Complete  PCP or Specialist Appt within 3-5 Days  Complete  HRI or Home Care Consult  Complete  Social Work Consult for Recovery Care Planning/Counseling  Complete  Palliative Care Screening  Not Applicable  Medication Review Oceanographer) Complete Complete  PCP or Specialist appointment within 3-5 days of discharge Complete   HRI or Home Care Consult Complete   SW Recovery Care/Counseling Consult Complete   Palliative Care Screening Not Applicable   Skilled Nursing Facility Not Applicable

## 2024-09-29 DIAGNOSIS — G9341 Metabolic encephalopathy: Secondary | ICD-10-CM | POA: Diagnosis not present

## 2024-09-29 DIAGNOSIS — Z66 Do not resuscitate: Secondary | ICD-10-CM | POA: Diagnosis not present

## 2024-09-29 DIAGNOSIS — J9611 Chronic respiratory failure with hypoxia: Secondary | ICD-10-CM | POA: Diagnosis not present

## 2024-09-29 DIAGNOSIS — N390 Urinary tract infection, site not specified: Secondary | ICD-10-CM | POA: Diagnosis not present

## 2024-09-29 DIAGNOSIS — Z515 Encounter for palliative care: Secondary | ICD-10-CM | POA: Diagnosis not present

## 2024-09-29 LAB — GLUCOSE, CAPILLARY
Glucose-Capillary: 233 mg/dL — ABNORMAL HIGH (ref 70–99)
Glucose-Capillary: 236 mg/dL — ABNORMAL HIGH (ref 70–99)
Glucose-Capillary: 300 mg/dL — ABNORMAL HIGH (ref 70–99)
Glucose-Capillary: 355 mg/dL — ABNORMAL HIGH (ref 70–99)
Glucose-Capillary: 163 mg/dL — ABNORMAL HIGH (ref 70–99)

## 2024-09-29 NOTE — Progress Notes (Signed)
 IP PROGRESS NOTE  Subjective:   Brenda [name] was agitated and yelling when I saw her at approximately 6:30 AM.  No family was present.  Objective: Vital signs in last 24 hours: Blood pressure (!) 149/51, pulse 89, temperature 98.5 F (36.9 C), temperature source Oral, resp. rate 18, height 5' 2 (1.575 m), weight 189 lb (85.7 kg), last menstrual period 10/30/2014, SpO2 99%.  Intake/Output from previous day: 01/26 0701 - 01/27 0700 In: 600 [NG/GT:500] Out: 300 [Urine:300]  Physical Exam:   Neurologic: Alert, says a few words, yelling, says I need a psychiatrist , not oriented, confused, moves the right hand and feet to command, not moving the left hand  Abdomen: Left upper quadrant feeding tube ,firmness with the end and deep to the abdominal pannus surrounding the umbilicus Vascular: No leg edema, SCDs in place Skin: Tape reaction surrounding the Port-A-Cath appears to be resolving,  zoster rash at the left anterior and posterior chest is crusted   Portacath/PICC-without erythema  Lab Results: No results for input(s): WBC, HGB, HCT, PLT in the last 72 hours.    BMET No results for input(s): NA, K, CL, CO2, GLUCOSE, BUN, CREATININE, CALCIUM  in the last 72 hours.      Medications: I have reviewed the patient's current medications.  Assessment/Plan:  Endometrial cancer Diagnosed in 2023, status post surgery and radiation Recurrent disease July 2025 with a left retroperitoneal mass, biopsy confirmed adenocarcinoma, ER positive, MSI-high, tumor mutation burden 24, PD-L1 75%, PIK 3CA mutated, PTEN mutated, HER2 1+, loss of MLH1 expression 04/03/2024 CT chest: Spiculated 9 x 12 mm right upper lobe and part solid 4 x 6 mm right upper lobe nodules Cycle 1 paclitaxel /carboplatin /pembrolizumab  05/01/2024 Cycle 2 paclitaxel /carboplatin /pembrolizumab  06/30/2024 06/19/2024 CT abdomen/pelvis: Right lower lobe pulmonary embolism, splenic infarct, stable size of  the left psoas mass with evidence of erosive change at the anterior aspect of L2, decreased size of left external iliac node 08/09/2024 CTs: New psoas muscle mass (tumor versus hematoma), left psoas/paraspinal mass has decreased in size, by my review the previously noted lung nodules have resolved 08/18/2024 CT angiogram abdomen/pelvis-no evidence of active GI bleed, right psoas hematoma, L2 sclerotic change with left paraspinal mass  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  DVT 06/02/2024 4.  Pulmonary embolism noted on CT chest 06/19/2024 5.  Diabetes 6.  CHF/CAD 7.  Seizures, cefepime  toxicity?  November 2025 8.  Progressive generalized loss of motor function, altered mental status-related to CVAs?,  Paraneoplastic?  Toxicity from immunotherapy? 08/11/2024-Solu-Medrol  daily x 3 Therapy plasma exchange 08/21/2024, 08/23/2024, 08/25/2024, 08/27/2024, 08/29/2024 9.  History of respiratory failure 10.  Pain secondary to #1 11.  Anemia-multifactorial, 1 unit RBCs 09/07/2024 12.  History of elevated liver enzymes, now normal  13.  Zoster rash 09/18/2024-valacyclovir , completed 09/27/2024  Brenda [name] has a history of endometrial cancer initially diagnosed in 2020.  She was diagnosed with recurrent disease involving a left retroperitoneal mass in July 2025.  She completed 2 treatments with paclitaxel /carboplatin /pembrolizumab .  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  She completed 2 cycles of systemic therapy for treatment of the uterine cancer.  I reviewed the 08/09/2024 restaging CT findings with Brenda [name] and her daughter.  I reviewed the 08/18/2024 CT  angiogram  abdomen/pelvis.  The known malignancy at the left retroperitoneum has improved significantly.  The right psoas mass appears to represent a hematoma.      The etiology of the diffuse generalized weakness remains unclear.  There is likely a component related to the embolic CVAs and deconditioning, but the loss of motor function  is more severe than expected.  She has undergone an extensive neurologic evaluation. Neurology is following her in the hospital..  Neurology feels her symptoms may be related to PD-1 inhibitor neurotoxicity, a paraneoplastic syndrome, or another inflammatory process.  She started high-dose Solu-Medrol  on 08/11/2024, given daily for 3 days.  Her neurologic status did not improve.  She began plasma exchange on 08/21/2024.  She completed treatment #5 on 08/29/2024.  There was initial improvement in her neurologic status with more movement and verbal interaction, but she has not shown further improvement. .  She has been evaluated by neurology and noted to have a decline in her neurologic status.  A paraneoplastic syndrome is felt to be a possible.  Neurology is considering a trial of IVIG.  Arrangements were made for transfer to [facility], but the transfer was canceled after further discussion with the [facility] neurologist.  The etiology of her pain is unclear based on the restaging CT.  The retroperitoneal mass is much smaller.  She could be having pain from the right psoas lesion or another etiology.  There is no clear evidence for progression of the metastatic carcinoma.  The significance of the firm fullness around the umbilicus is unclear.  I cannot appreciate an abdominal wall mass on the 08/18/2024 CT.    She has persistent anemia.  The anemia is multifactorial including components related to bleeding, phlebotomy, and chronic disease.  The hemoglobin was stable on 09/26/2024.  She he is not a candidate for further treatment of the metastatic endometrial cancer unless her  neurologic status and overall performance status improved.  The loss of MLH1/PMS2 expression on the retroperitoneal biopsy from August 2025 is very likely to indicate a sporadic tumor.  Germline testing cannot be obtained while she is in the hospital.  I will follow her chart and check on her periodically.  I remain available to speak to her family as needed.   Recommendations: Wean narcotics and other CNS acting agents as tolerated Management of neurology status as recommended by the neurology service Continue Lovenox  prophylaxis, consider reescalating to full dose anticoagulation tube feedings, diet as tolerated Systemic treatment for the uterine cancer will remain on hold Continue goals of care discussions by palliative care and the medical service Plan for skilled nursing facility placement noted 8.  Consider psychiatry consult if patient remains agitated 9.   Restaging CT abdomen/pelvis if additional prognostic information needed for further goals of care discussions 10.  Please call oncology as needed           LOS: 74 days   Arley Hof, MD   09/29/2024, 7:44 AM

## 2024-09-29 NOTE — Progress Notes (Signed)
 " PROGRESS NOTE  Brenda [name]  FMW:[id number] DOB: [birth year] DOA: 07/15/2024 PCP: [facility]   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  Transferred from [facility] to home for suspicion of seizures.  Started on Keppra .  Also received empiric antibiotics for concern of meningitis/encephalitis but CSF panel was negative so antibiotics discontinued underwent treatment with high-dose steroids followed by plasmapheresis.  Neurology was following.  Long length of stay.  Attempted to transfer to higher centers, declined.  PT/OT recommending SNF on discharge.LLOS.  TOC following.  Can be discharged to SNF if placement found.  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  Neurology was following.  Infectious etiology ruled out, antibiotics discontinued.  Concern for seizures was started on Keppra .  Also underwent treatment with steroids, plasmapheresis.  Hospital course remarkable  for worsening catatonia/stiffness.  MRI showed concerns for evolving infarct.  There was also concern for neurotoxicity secondary to Keytruda  so it was discontinued. -Status post PLEX x 5 days completed Dec 12/28 -Status post high-dose steroids 12/9 - 12/11 -Now on Keppra  500 mg twice daily - Repeat EEG unremarkable Attempted to transfer her to [facility] on 1/16 for second opinion, declined. Initially accepted for transfer to [facility] by Dr. Reta but their neurology team called again on 1/22 discussed the case in detail with Dr Voncile.  Eventually was declined. patient does not need any further neurology workup from their standpoint therefore declined transfer.  No plan for transfer now.  Left scalp ring lesion Vesicular rash around left back/axilla VZV infection Concern for tinea corporis, started on terbinafine , plan for 2 weeks of treatment.  Completion of therapy on 1/29 Seen by ID.  HSV PCR negative, VZV positive.  Completed course of  valacyclovir , last day of treatment on 1/25 Continue with airborne precaution until all lesions have scabbed over.  History of endometrial cancer/cancer-related pain: Seen by oncology, urgent care.  There is no clear evidence of progression of metastatic carcinoma as per imagings.  Keytruda  on hold.  Continue pain regimen  History of DVT/PE/splenic infarct: Anticoagulation on hold due to psoas hematoma, GI bleed.  Currently only on DVT prophylaxis.  Right hand/wrist swelling: Improved.  Continue elevation  GI bleed/acute on chronic anemia: Secondary to malignancy and also GI bleed.  Had bright blood red per rectum which has resolved.  CT GI bleed negative.  Required blood transfusion during this hospitalization.  Monitor hemoglobin intermittently, currently stable  History of Klebsiella UTI: Completed antibiotics course.  Dysphagia/moderate malnutrition: Placed PEG by IR on 1/6.  Continue tube feeding  Diabetes type 2: Recent A1c of 7.2.  Continue current  insulin  regimen.  Hypoglycemic on 1/23, so dose of Lantus  reduced.  Diabetic coordinator following  History of depression/anxiety: On BuSpar , Ativan  as needed.  Prozac  discontinued due to rigidity  History of coronary artery disease/stroke/hyperlipidemia: On Lipitor .  Anticoagulation on hold  Abnormal thyroid  function test:Initially low TSH and elevated free T4, was started on propranolol . Repeat levels have normalized   Debility/deconditioning/disposition: PT/OT recommending SNF on discharge.  TOC following.  Difficulty in placement.       Nutrition Problem: Moderate Malnutrition Etiology: chronic illness (cancer) Wound 08/11/24 0415 Pressure Injury Vertebral column Medial Stage 2 -  Partial thickness loss of dermis presenting as a shallow open injury with a red, pink wound bed without slough. (Active)     Wound 08/31/24 2100 Pressure Injury Thigh Left;Posterior Stage 1 -  Intact skin with non-blanchable redness of a localized area usually over a bony prominence. (Active)    DVT prophylaxis:enoxaparin  (LOVENOX ) injection 40 mg Start: 09/10/24 1000 Place and maintain sequential compression device Start: 08/02/24 0738     Code Status: Limited: Do not attempt resuscitation (DNR) -DNR-LIMITED -Do Not Intubate/DNI   Family Communication: Called and discussed with husband [name] on phone on 1/24.  Discussed with daughter [name] on phone on 1/26  Patient status:Inpatient  Patient is from :home  Anticipated discharge to:SNF  Estimated DC date:whenever possible.  Long length of stay   Consultants: Neurology, oncology, palliative care  Procedures: Plex  Antimicrobials:  Anti-infectives (From admission, onward)    Start     Dose/Rate Route Frequency Ordered Stop   09/18/24 1930  valACYclovir  (VALTREX ) tablet 1,000 mg        1,000 mg Oral 3 times daily 09/18/24 1756 09/25/24 1605   09/18/24 0915  fluconazole  (DIFLUCAN ) tablet 150 mg        150 mg Oral  Once 09/18/24 0820  09/18/24 1037   09/14/24 1730  fluconazole  (DIFLUCAN ) tablet 150 mg        150 mg Per Tube  Once 09/14/24 1643 09/14/24 1810   09/09/24 0800  cefTRIAXone  (ROCEPHIN ) 2 g in sodium chloride  0.9 % 100 mL IVPB        2 g 200 mL/hr over 30 Minutes Intravenous Every 24 hours 09/09/24 0624 09/12/24 0818   09/08/24 0831  ceFAZolin  (ANCEF ) IVPB 2g/100 mL premix        over 30 Minutes Intravenous Continuous PRN 09/08/24 0831 09/08/24 0856   08/16/24 1100  cefTRIAXone  (ROCEPHIN ) 2 g in sodium chloride  0.9 % 100 mL IVPB        2 g 200 mL/hr over 30 Minutes Intravenous Every 24 hours 08/16/24 1001 08/20/24 1214   07/23/24 0600  vancomycin  (VANCOCIN ) IVPB 1000 mg/200 mL premix  Status:  Discontinued        1,000 mg 200 mL/hr over 60 Minutes Intravenous Every 12 hours 07/22/24 1834 07/24/24 1808   07/22/24 2200  meropenem  (MERREM ) 2 g in sodium chloride  0.9 % 100 mL IVPB  Status:  Discontinued        2 g 280 mL/hr over 30 Minutes Intravenous Every 8 hours 07/22/24 1601 07/24/24 1808   07/22/24 1700  vancomycin  (VANCOREADY) IVPB 1500 mg/300 mL        1,500 mg 150 mL/hr over 120 Minutes Intravenous  Once 07/22/24 1601 07/22/24 2031   07/22/24 1645  ampicillin  (OMNIPEN) 2 g in sodium chloride  0.9 % 100 mL IVPB  Status:  Discontinued  2 g 300 mL/hr over 20 Minutes Intravenous Every 6 hours 07/22/24 1557 07/24/24 1800   07/19/24 0530  meropenem  (MERREM ) 1 g in sodium chloride  0.9 % 100 mL IVPB  Status:  Discontinued        1 g 200 mL/hr over 30 Minutes Intravenous Every 8 hours 07/19/24 0438 07/22/24 1601   07/16/24 0900  ceFEPIme  (MAXIPIME ) 2 g in sodium chloride  0.9 % 100 mL IVPB  Status:  Discontinued        2 g 200 mL/hr over 30 Minutes Intravenous Every 8 hours 07/16/24 0811 07/19/24 0409   07/16/24 0815  ceFEPIme  (MAXIPIME ) 2 g in sodium chloride  0.9 % 100 mL IVPB  Status:  Discontinued        2 g 200 mL/hr over 30 Minutes Intravenous  Once 07/16/24 0803 07/16/24 0811   07/16/24 0330   cefTRIAXone  (ROCEPHIN ) 2 g in sodium chloride  0.9 % 100 mL IVPB        2 g 200 mL/hr over 30 Minutes Intravenous  Once 07/16/24 0315 07/16/24 0412       Subjective: Patient seen and examined at bedside today.  Hemodynamically stable.  Lying on bed.  Not in apparent distress.  Same like yesterday.  Moaning/crying intermittently.  Objective: Vitals:   09/29/24 0840 09/29/24 0841 09/29/24 0842 09/29/24 0850  BP:    (!) 153/51  Pulse: 93 94 94 93  Resp: 20 (!) 25 18 (!) 24  Temp:    98.5 F (36.9 C)  TempSrc:    Axillary  SpO2: 100% 100% 100% 100%  Weight:      Height:        Intake/Output Summary (Last 24 hours) at 09/29/2024 1112 Last data filed at 09/29/2024 1019 Gross per 24 hour  Intake 600 ml  Output 300 ml  Net 300 ml   Filed Weights   09/07/24 0430 09/08/24 0500 09/19/24 0255  Weight: 87.1 kg 84.5 kg 85.7 kg    Examination:  General exam: Overall comfortable, not in distress, severely deconditioned, chronically ill looking HEENT: PERRL Respiratory system:  no wheezes or crackles, diminished sounds bilaterally on bases Cardiovascular system: S1 & S2 heard, RRR.  Port-A-Cath on the right chest Gastrointestinal system: Abdomen is nondistended, soft and nontender.  PEG tube Central nervous system: Alert and awake but not oriented, Extremities: Contracture of the left upper extremity, global weakness Skin: varicella rash on the back GU: Foley     Data Reviewed: I have personally reviewed following labs and imaging studies  CBC: Recent Labs  Lab 09/23/24 0443 09/26/24 0500  WBC 7.2 7.6  HGB 9.9* 10.9*  HCT 30.5* 32.8*  MCV 92.7 90.9  PLT 217 298   Basic Metabolic Panel: Recent Labs  Lab 09/23/24 0443 09/26/24 0500  NA 133* 133*  K 4.2 4.3  CL 95* 95*  CO2 31 31  GLUCOSE 427* 302*  BUN 17 13  CREATININE 0.42* 0.38*  CALCIUM  8.9 9.3     Recent Results (from the past 240 hours)  Varicella-zoster by PCR     Status: Abnormal   Collection Time:  09/19/24 11:18 AM   Specimen: Lesion; Sterile Swab  Result Value Ref Range Status   Varicella-Zoster, PCR Positive (A) Negative Final    Comment: (NOTE) Varicella Zoster Virus DNA detected. This test was developed and its performance characteristics determined by [facility]. It has not been cleared or approved by the Food and Drug Administration. Performed At: [facility] [address] [place], [place] [id number]  Jennette Shorter MD Ey:[phone]      Radiology Studies: No results found.   Scheduled Meds:  atorvastatin   80 mg Per Tube QHS   busPIRone   5 mg Oral BID   Chlorhexidine  Gluconate Cloth  6 each Topical Daily   enoxaparin  (LOVENOX ) injection  40 mg Subcutaneous Q24H   famotidine   20 mg Per Tube BID   feeding supplement (GLUCERNA 1.5 CAL)  237 mL Per Tube TID WC & HS   feeding supplement (PROSource TF20)  60 mL Per Tube Daily   fentaNYL   1 patch Transdermal Q72H   FLUoxetine   40 mg Per Tube Daily   free water   100 mL Per Tube 5 X Daily   gabapentin   200 mg Per Tube BID   insulin  aspart  0-15 Units Subcutaneous TID WC   insulin  aspart  0-5 Units Subcutaneous QHS   insulin  aspart  5 Units Subcutaneous TID WC   insulin  glargine  35 Units Subcutaneous Daily   levETIRAcetam   500 mg Per Tube BID   lidocaine   1 patch Transdermal Daily   nystatin   100,000 Units Topical TID   mouth rinse  15 mL Mouth Rinse 4 times per day   oxyCODONE   5 mg Per Tube BID   polyethylene glycol  17 g Per Tube Daily   sodium chloride  flush  10-40 mL Intracatheter Q12H   terbinafine   1 Application Topical BID   Continuous Infusions:   LOS: 74 days   Ivonne Mustache, MD [facility] P1/27/2026, 11:12 AM  "

## 2024-09-29 NOTE — Progress Notes (Incomplete)
 " Daily Progress Note   Date: 09/29/2024   Patient Name: Brenda [name]  DOB: [birth year]  MRN: [id number]  Age / Sex: 64 y.o., female  Attending Physician: Jillian Buttery, MD Primary Care Physician: [facility] Admit Date: 07/15/2024 Length of Stay: 74 days  Reason for Follow-up: Establishing goals of care  Past Medical History:  Diagnosis Date   Anemia    Arthritis    back, hands, hips (02/22/2015)   CAD (coronary artery disease)    a. complex LAD/diagonal bifurcation PCI in 2010. b. STEMI 10/2019 s/p PTCA/DES x1 to mLAD overlapping the old stent, residual disease treated medicaly.   Cancer [facility])    uterine   CHF (congestive heart failure) (HCC)    Depression    Diabetes mellitus (HCC)    started when I was pregnant; not sure if it was type 1 or type 2    History of radiation therapy    Endometrium- HDR 01/22/22-03/07/22- Dr. Lynwood Nasuti   Hypercholesterolemia    Hypertension    Morbid obesity (HCC)    Myocardial infarction (HCC)    mild x 3   Neuromuscular disorder (HCC)    neuropathy feet   Sleep apnea    cpap/    Assessment & Plan:   HPI/Patient Profile:   64 y.o. female with past medical history of metastatic endometrial cancer s/p hysterectomy and adjuvant radiation (2023), HTN, chronic cancer related pain, CVA (05/2024) admitted on 07/15/2024 with cefepime  neurotoxicity.   Patient has had multiple work up for her poor mentation and BUE rigidity in the last 3 months including a lumbar puncture on 07/24/2024 that was negative. Patient has had repeat EEG (08/09/2024, 09/17/2024) which were negative since her last recorded seizure on 07/19/2024 that was attributed to cefepime  neurotoxicity. Neurology has attempted several trials of medication including high dose steroids (12/9-12/11) for possible PD1 antibody induced side effects without improvement in mentation or rigidity. Plasma exchange x5 completed 08/29/2024 without improvement. Trial  of sinemet  for possible parkinsonian like movements without improvement. Attempted ativan  challenge for possible catatonia which resulted in somnolence and no improvement.   Patient initially accepted for transfer to [facility] for further work up and possible intervention for her poor mentation, but later denied a transfer as their neurology team does not think they have anything else to offer that is not already been tried at [facility]. Hospitalist team tried multiple times to transfer patient to [facility] and [facility] but denied transfer.   Palliative medicine re consulted on 09/29/2024 after patient expressed that she wanted to die.   SUMMARY OF RECOMMENDATIONS DNR-limited Continue current medical management Will attempt to discuss GOC with husband when he recovers from surgery himself Palliative medicine will continue to follow for goals of care conversation, plan for meeting/speaking with husband on 1/30  Symptom Management:  Per primary team  Code Status: DNR - Limited (DNR/DNI)  Prognosis: Unable to determine  Discharge Planning: To Be Determined  Discussed with: Adhikari MD 09/29/2024 about patient's lack of capacity at this time and plan for further goals of care discussion when patient's primary decision maker (husband) is recovered from surgery.   Subjective:   Subjective: Chart Reviewed. Updates received. Patient Assessed. Created space and opportunity for patient  and family to explore thoughts and feelings regarding current medical situation.  Today's Discussion:  Met with the patient at bedside with her friend present. Patient has been screaming and talking about her sister Brenda [name]) who has passed away. When asked how  she is doing patient says I don't want to live but I don't want to die. Attempted to talk about hospice with patient but patient's poor mentation would not allow for a fruitful conversation. Patient lacks capacity at this time as she was only able to answer 1 out of 5  yes/no questions correctly. Patient severely agitated and altered at this time.   Spoke on phone with the patient's daughter Brenda [name]). Reviewed medical work up so far concerning her mother's poor mentation. Family had hoped for a transfer to [facility] but after further discussion between neurology, patient was denied a transfer to [facility] as neurology said they would not offer anything further that [facility] has not already attempted. Daughter inquiring about short term rehab and reviewed with her that PT/OT has signed off as the patient is not making progress - this is concerning as it would also mean patient would not be a great candidate for short term rehab as she might not be able to participate or adequately participate. Reviewed that patient might not be appropriate for short term rehab and family might have to consider long term care placement.   Daughter expressed understanding and inquired about hospice. Reviewed with her that hospice would mean shifting focus to symptom management for pain and anxiety which has been the main discomfort the patient has been experiencing. Also reviewed that it would mean taking a step back from aggressive medical interventions that we know is no longer going to help such as getting frequent labs, no longer pursuing chemotherapy, and also stopping PEG tube feeds knowing that time is limited. Daughter shared that her sister is worried that the patient is going to starve and be thirsty. Reviewed that the patient can have comfort feeds and that when patients are transitioning, their interest in food diminishes. Also discussed that when the body is beginning to shut down it breaks down chemicals that can induce a mild euphoria and she would no longer experience the hunger that others are feeling. Also reviewed that if patient continued to receive IV fluids, it would not really prevent her from being thirsty. The sensation of thirst is best managed by oral swabs and comfort  drinking as needed. Daughter expressed that she has been considering hospice for a while now, but her father and her sister are hesitant to pursue hospice at this time, they continue to want to pursue aggressive medical interventions to prolong life while also wanting to maximize her pain and anxiety management.   Daughter also inquired about IVIG as that was the last discussion with neurology and primary team in an attempt to improve her mentation and rigidity. Shared with daughter that I would contact primary team to discuss.  Daughter shares that her father is undergoing surgery today for breast cancer and should return home today barring any complications. I plan on speaking with the patient's husband for further goals of care.   Review of Systems  Unable to perform ROS   Objective:   Primary Diagnoses: Present on Admission:  UTI (urinary tract infection)  Chronic hypoxic respiratory failure (HCC)  Anxiety with depression  CAD (coronary artery disease)  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  DNR (do not resuscitate)  Endometrial carcinoma (HCC)  Obstructive sleep apnea  Acute metabolic encephalopathy   Vital Signs:  BP (!) 153/51 (BP Location: Left Leg)   Pulse 93   Temp 98.6 F (37 C) (Oral)   Resp (!) 24   Ht 5' 2 (1.575 m)  Wt 85.7 kg   LMP 10/30/2014   SpO2 100%   BMI 34.57 kg/m   Physical Exam Constitutional:      General: She is in acute distress.     Appearance: She is obese. She is ill-appearing and toxic-appearing.  HENT:     Head: Normocephalic and atraumatic.     Nose: Nose normal.     Mouth/Throat:     Mouth: Mucous membranes are dry.  Cardiovascular:     Rate and Rhythm: Normal rate.  Pulmonary:     Effort: Pulmonary effort is normal.  Skin:    General: Skin is warm.  Neurological:     Mental Status: She is alert. She is disoriented.     Palliative Assessment/Data: 30% (tube feeds)   Existing Vynca/ACP  Documentation: None  Thank you for allowing us  to participate in the care of Samaira Holzworth PMT will continue to support holistically.  I personally spent a total of 65 minutes in the care of the patient today including preparing to see the patient, getting/reviewing separately obtained history, performing a medically appropriate exam/evaluation, counseling and educating, referring and communicating with other health care professionals, and documenting clinical information in the EHR.   [name]  Palliative Medicine Team  Team Phone # [phone] (Nights/Weekends) 09/29/2024 5:09 PM  "

## 2024-09-30 DIAGNOSIS — G9341 Metabolic encephalopathy: Secondary | ICD-10-CM | POA: Diagnosis not present

## 2024-09-30 LAB — CBC
HCT: 35.6 % — ABNORMAL LOW (ref 36.0–46.0)
Hemoglobin: 11.9 g/dL — ABNORMAL LOW (ref 12.0–15.0)
MCH: 30.7 pg (ref 26.0–34.0)
MCHC: 33.4 g/dL (ref 30.0–36.0)
MCV: 91.8 fL (ref 80.0–100.0)
Platelets: 335 10*3/uL (ref 150–400)
RBC: 3.88 MIL/uL (ref 3.87–5.11)
RDW: 15.1 % (ref 11.5–15.5)
WBC: 10.3 10*3/uL (ref 4.0–10.5)
nRBC: 0 % (ref 0.0–0.2)

## 2024-09-30 LAB — GLUCOSE, CAPILLARY
Glucose-Capillary: 70 mg/dL (ref 70–99)
Glucose-Capillary: 93 mg/dL (ref 70–99)
Glucose-Capillary: 140 mg/dL — ABNORMAL HIGH (ref 70–99)
Glucose-Capillary: 70 mg/dL (ref 70–99)
Glucose-Capillary: 157 mg/dL — ABNORMAL HIGH (ref 70–99)

## 2024-09-30 MED ORDER — INSULIN ASPART 100 UNIT/ML IJ SOLN
0.0000 [IU] | Freq: Three times a day (TID) | INTRAMUSCULAR | Status: DC
  Administered 2024-10-01: 5 [IU] via SUBCUTANEOUS
  Administered 2024-10-01: 3 [IU] via SUBCUTANEOUS
  Administered 2024-10-01: 1 [IU] via SUBCUTANEOUS
  Administered 2024-10-02: 3 [IU] via SUBCUTANEOUS
  Administered 2024-10-02: 1 [IU] via SUBCUTANEOUS
  Administered 2024-10-02: 2 [IU] via SUBCUTANEOUS
  Administered 2024-10-03 (×2): 3 [IU] via SUBCUTANEOUS
  Administered 2024-10-04: 9 [IU] via SUBCUTANEOUS
  Administered 2024-10-04: 2 [IU] via SUBCUTANEOUS
  Administered 2024-10-04 – 2024-10-05 (×2): 7 [IU] via SUBCUTANEOUS
  Administered 2024-10-05: 2 [IU] via SUBCUTANEOUS
  Administered 2024-10-05 – 2024-10-06 (×2): 3 [IU] via SUBCUTANEOUS
  Administered 2024-10-06: 7 [IU] via SUBCUTANEOUS
  Administered 2024-10-07 (×2): 3 [IU] via SUBCUTANEOUS
  Administered 2024-10-07: 2 [IU] via SUBCUTANEOUS
  Administered 2024-10-08: 7 [IU] via SUBCUTANEOUS
  Administered 2024-10-08 (×2): 3 [IU] via SUBCUTANEOUS
  Administered 2024-10-09: 2 [IU] via SUBCUTANEOUS
  Filled 2024-09-30 (×2): qty 7
  Filled 2024-09-30: qty 1
  Filled 2024-09-30: qty 2
  Filled 2024-09-30: qty 1
  Filled 2024-09-30 (×2): qty 2
  Filled 2024-09-30 (×2): qty 1
  Filled 2024-09-30: qty 2
  Filled 2024-09-30: qty 3
  Filled 2024-09-30 (×2): qty 5
  Filled 2024-09-30: qty 1
  Filled 2024-09-30 (×2): qty 3
  Filled 2024-09-30: qty 1
  Filled 2024-09-30: qty 2
  Filled 2024-09-30: qty 1
  Filled 2024-09-30: qty 3
  Filled 2024-09-30: qty 7
  Filled 2024-09-30: qty 3

## 2024-09-30 MED ORDER — INSULIN ASPART 100 UNIT/ML IJ SOLN
0.0000 [IU] | Freq: Every day | INTRAMUSCULAR | Status: DC
  Administered 2024-10-04: 3 [IU] via SUBCUTANEOUS
  Filled 2024-09-30: qty 3

## 2024-09-30 NOTE — Inpatient Diabetes Management (Signed)
 Inpatient Diabetes Program Recommendations  AACE/ADA: New Consensus Statement on Inpatient Glycemic Control (2015)  Target Ranges:  Prepandial:   less than 140 mg/dL      Peak postprandial:   less than 180 mg/dL (1-2 hours)      Critically ill patients:  140 - 180 mg/dL   Lab Results  Component Value Date   GLUCAP 93 09/30/2024   HGBA1C 7.2 (H) 06/01/2024    Latest Reference Range & Units 09/29/24 08:49 09/29/24 12:41 09/29/24 16:51 09/29/24 20:56 09/30/24 05:43 09/30/24 07:55  Glucose-Capillary 70 - 99 mg/dL 763 (H) 699 (H) 644 (H) 163 (H) 70 93  (H): Data is abnormally high   Diabetes history: DM2 Outpatient Diabetes medications:  Tresiba  18 units daily                      Fiasp  1-3 units TID with meals Metformin  1000 mg BID  Current orders for Inpatient glycemic control: Lantus  35 units daily Novolog  5 units tid meal coverage Novolog  0-15 units tid, 0-5 units hs  Inpatient Diabetes Program Recommendations:   Please consider:  -Decrease Novolog  correction to 0-9 units tid, 0-5 units hs  Thank you, Axel Frisk E. Reginal Wojcicki, RN, MSN, CNS, CDCES  Diabetes Coordinator Inpatient Glycemic Control Team Team Pager [phone] (8am-5pm) 09/30/2024 12:05 PM

## 2024-09-30 NOTE — Progress Notes (Signed)
 " PROGRESS NOTE  Brenda [name]  FMW:[id number] DOB: [birth year] DOA: 07/15/2024 PCP: [facility]   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  Transferred from [facility] to home for suspicion of seizures.  Started on Keppra .  Also received empiric antibiotics for concern of meningitis/encephalitis but CSF panel was negative so antibiotics discontinued underwent treatment with high-dose steroids followed by plasmapheresis.  Neurology was following.  Long length of stay.  Attempted to transfer to higher centers, declined.  PT/OT recommending SNF on discharge.LLOS.  TOC following.  Can be discharged to SNF if placement found. Palliative care also consulted for goals of care discussion due to her poor prognosis, quality of life.  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  Neurology was following.  Infectious etiology ruled out, antibiotics discontinued.  Concern for seizures was  started on Keppra .  Also underwent treatment with steroids, plasmapheresis.  Hospital course remarkable for worsening catatonia/stiffness.  MRI showed concerns for evolving infarct.  There was also concern for neurotoxicity secondary to Keytruda  so it was discontinued. -Status post PLEX x 5 days completed Dec 12/28 -Status post high-dose steroids 12/9 - 12/11 -Now on Keppra  500 mg twice daily - Repeat EEG unremarkable Attempted to transfer her to [facility] on 1/16 for second opinion, declined. Initially accepted for transfer to [facility] by Dr. Reta but their neurology team called again on 1/22 discussed the case in detail with Dr Voncile.  Eventually was declined. patient does not need any further neurology workup from their standpoint therefore declined transfer.  No plan for transfer now.  Left scalp ring lesion Vesicular rash around left back/axilla VZV infection Concern for tinea corporis, started on terbinafine , plan for 2 weeks of treatment.  Completion of therapy on 1/29 Seen by ID.  HSV PCR negative, VZV positive.  Completed course of  valacyclovir , last day of treatment on 1/25 Continue with airborne precaution until all lesions have scabbed over.  History of endometrial cancer/cancer-related pain: Seen by oncology, urgent care.  There is no clear evidence of progression of metastatic carcinoma as per imagings.  Keytruda  on hold.  Continue pain regimen  History of DVT/PE/splenic infarct: Anticoagulation on hold due to psoas hematoma, GI bleed.  Currently only on DVT prophylaxis.  Right hand/wrist swelling: Improved.  Continue elevation  GI bleed/acute on chronic anemia: Secondary to malignancy and also GI bleed.  Had bright blood red per rectum which has resolved.  CT GI bleed negative.  Required blood transfusion during this hospitalization.  Monitor hemoglobin intermittently, currently stable  History of Klebsiella UTI: Completed antibiotics course.  Dysphagia/moderate malnutrition:  Placed PEG by IR on 1/6.  Continue tube feeding  Diabetes type 2: Recent A1c of 7.2.  Continue current insulin  regimen.  Diabetic coordinator was following  History of depression/anxiety: On BuSpar , Ativan  as needed.  Prozac  discontinued due to rigidity  History of coronary artery disease/stroke/hyperlipidemia: On Lipitor .  Anticoagulation on hold  Abnormal thyroid  function test:Initially low TSH and elevated free T4, was started on propranolol . Repeat levels have normalized   Debility/deconditioning/disposition: PT/OT recommending SNF on discharge.  TOC following.  Difficulty in placement.  Goals of care: Patient remains confused, bedbound, encephalopathic.  Poor quality of life.  On tube feeding.  CODE STATUS DNR/DNI.   She is a candidate for comfort care.  Palliative care are planning to meet with husband on 1/30       Nutrition Problem: Moderate Malnutrition Etiology: chronic illness (cancer) Wound 08/11/24 0415 Pressure Injury Vertebral column Medial Stage 2 -  Partial thickness loss of dermis presenting as a shallow open injury with a red, pink wound bed without slough. (Active)     Wound 08/31/24 2100 Pressure Injury Thigh Left;Posterior Stage 1 -  Intact skin with non-blanchable redness of a localized area usually over a bony prominence. (Active)    DVT prophylaxis:enoxaparin  (LOVENOX ) injection 40 mg Start: 09/10/24 1000 Place and maintain sequential compression device Start: 08/02/24 0738     Code Status: Limited: Do not attempt resuscitation (DNR) -DNR-LIMITED -Do Not Intubate/DNI   Family Communication: Called and discussed with husband Brenda [name] on phone on 1/24.  Discussed with daughter Brenda [name] on phone on 1/26  Patient status:Inpatient  Patient is from :home  Anticipated discharge to:SNF?  Estimated DC date:whenever possible.  Long length of stay.  Ongoing goals of care discussion   Consultants: Neurology, oncology, palliative care  Procedures:  Plex  Antimicrobials:  Anti-infectives (From admission, onward)    Start     Dose/Rate Route Frequency Ordered Stop   09/18/24 1930  valACYclovir  (VALTREX ) tablet 1,000 mg        1,000 mg Oral 3 times daily 09/18/24 1756 09/25/24 1605   09/18/24 0915  fluconazole  (DIFLUCAN ) tablet 150 mg        150 mg Oral  Once 09/18/24 0820 09/18/24 1037   09/14/24 1730  fluconazole  (DIFLUCAN ) tablet 150 mg        150 mg Per Tube  Once 09/14/24 1643 09/14/24 1810   09/09/24 0800  cefTRIAXone  (ROCEPHIN ) 2 g in sodium chloride  0.9 % 100 mL IVPB        2 g 200 mL/hr over 30 Minutes Intravenous Every 24 hours 09/09/24 0624 09/12/24 0818   09/08/24 0831  ceFAZolin  (ANCEF ) IVPB 2g/100 mL premix        over 30 Minutes Intravenous Continuous PRN 09/08/24 0831 09/08/24 0856   08/16/24 1100  cefTRIAXone  (ROCEPHIN ) 2 g in sodium chloride  0.9 % 100 mL IVPB        2 g 200 mL/hr over 30 Minutes Intravenous Every 24 hours 08/16/24 1001 08/20/24 1214   07/23/24 0600  vancomycin  (VANCOCIN ) IVPB 1000 mg/200 mL premix  Status:  Discontinued        1,000 mg 200 mL/hr over 60 Minutes Intravenous Every 12 hours 07/22/24 1834 07/24/24 1808   07/22/24 2200  meropenem  (MERREM ) 2 g in sodium chloride  0.9 % 100 mL IVPB  Status:  Discontinued        2 g 280 mL/hr over 30 Minutes Intravenous Every 8 hours  07/22/24 1601 07/24/24 1808   07/22/24 1700  vancomycin  (VANCOREADY) IVPB 1500 mg/300 mL        1,500 mg 150 mL/hr over 120 Minutes Intravenous  Once 07/22/24 1601 07/22/24 2031   07/22/24 1645  ampicillin  (OMNIPEN) 2 g in sodium chloride  0.9 % 100 mL IVPB  Status:  Discontinued        2 g 300 mL/hr over 20 Minutes Intravenous Every 6 hours 07/22/24 1557 07/24/24 1800   07/19/24 0530  meropenem  (MERREM ) 1 g in sodium chloride  0.9 % 100 mL IVPB  Status:  Discontinued        1 g 200 mL/hr over 30 Minutes Intravenous Every 8 hours 07/19/24 0438 07/22/24 1601   07/16/24 0900  ceFEPIme  (MAXIPIME ) 2 g in sodium chloride  0.9 % 100  mL IVPB  Status:  Discontinued        2 g 200 mL/hr over 30 Minutes Intravenous Every 8 hours 07/16/24 0811 07/19/24 0409   07/16/24 0815  ceFEPIme  (MAXIPIME ) 2 g in sodium chloride  0.9 % 100 mL IVPB  Status:  Discontinued        2 g 200 mL/hr over 30 Minutes Intravenous  Once 07/16/24 0803 07/16/24 0811   07/16/24 0330  cefTRIAXone  (ROCEPHIN ) 2 g in sodium chloride  0.9 % 100 mL IVPB        2 g 200 mL/hr over 30 Minutes Intravenous  Once 07/16/24 0315 07/16/24 0412       Subjective: Patient seen and examined at bedside today.  Hemodynamically stable.  Lying in bed.  Confused, not agitated.  No change from yesterday.  On tube feeding.  Overall appears comfortable, not in any acute distress.  Objective: Vitals:   09/29/24 1943 09/29/24 2334 09/30/24 0500 09/30/24 0750  BP: (!) 154/50 (!) 129/42 (!) 147/43 (!) 139/52  Pulse: 87 73 78 92  Resp: 20 20 20 18   Temp: 98.4 F (36.9 C) 98.5 F (36.9 C) 98.8 F (37.1 C) 98.7 F (37.1 C)  TempSrc: Oral Axillary Oral Oral  SpO2: 97% 96% 98% 95%  Weight:      Height:        Intake/Output Summary (Last 24 hours) at 09/30/2024 1016 Last data filed at 09/30/2024 0800 Gross per 24 hour  Intake 600 ml  Output 1100 ml  Net -500 ml   Filed Weights   09/07/24 0430 09/08/24 0500 09/19/24 0255  Weight: 87.1 kg 84.5 kg 85.7 kg    Examination:  General exam: Very deconditioned, weak, lying in bed, chronically ill looking HEENT: PERRL Respiratory system:  no wheezes or crackles, diminished air sounds in bilateral bases Cardiovascular system: S1 & S2 heard, RRR.  Port-A-Cath on the right chest Gastrointestinal system: Abdomen is nondistended, soft and nontender.  PEG Central nervous system: Awake and but not alert or oriented Extremities: No edema, no clubbing ,no cyanosis, contracture of the left upper extremity, global weakness Skin: varicella rash on the back GU: Foley    Data Reviewed: I have personally reviewed following labs and  imaging studies  CBC: Recent Labs  Lab 09/26/24 0500 09/30/24 0500  WBC 7.6 10.3  HGB 10.9* 11.9*  HCT 32.8* 35.6*  MCV 90.9 91.8  PLT 298 335   Basic Metabolic Panel: Recent Labs  Lab 09/26/24 0500  NA 133*  K 4.3  CL 95*  CO2 31  GLUCOSE 302*  BUN 13  CREATININE 0.38*  CALCIUM  9.3     No results found for this or any previous visit (from  the past 240 hours).    Radiology Studies: No results found.   Scheduled Meds:  atorvastatin   80 mg Per Tube QHS   busPIRone   5 mg Oral BID   Chlorhexidine  Gluconate Cloth  6 each Topical Daily   enoxaparin  (LOVENOX ) injection  40 mg Subcutaneous Q24H   famotidine   20 mg Per Tube BID   feeding supplement (GLUCERNA 1.5 CAL)  237 mL Per Tube TID WC & HS   feeding supplement (PROSource TF20)  60 mL Per Tube Daily   fentaNYL   1 patch Transdermal Q72H   FLUoxetine   40 mg Per Tube Daily   free water   100 mL Per Tube 5 X Daily   gabapentin   200 mg Per Tube BID   insulin  aspart  0-15 Units Subcutaneous TID WC   insulin  aspart  0-5 Units Subcutaneous QHS   insulin  aspart  5 Units Subcutaneous TID WC   insulin  glargine  35 Units Subcutaneous Daily   levETIRAcetam   500 mg Per Tube BID   lidocaine   1 patch Transdermal Daily   nystatin   100,000 Units Topical TID   mouth rinse  15 mL Mouth Rinse 4 times per day   oxyCODONE   5 mg Per Tube BID   polyethylene glycol  17 g Per Tube Daily   sodium chloride  flush  10-40 mL Intracatheter Q12H   terbinafine   1 Application Topical BID   Continuous Infusions:   LOS: 75 days   Ivonne Mustache, MD [facility] P1/28/2026, 10:16 AM  "

## 2024-09-30 NOTE — TOC Progression Note (Signed)
 Transition of Care [facility]) - Progression Note    Patient Details  Name: Brenda [name] MRN: [id number] Date of Birth: Nov 24, 1960  [facility]) CM/SW Contact  Montie LOISE Louder, [place] Phone Number: 09/30/2024, 1:24 PM  Clinical Narrative:     Patient has no bed offers  Per chart review PC GOC  mtg on 1/30  TOC will continue to follow and assist with discharge planning.  Montie Louder, MSW, LCSW Clinical Social Worker    Expected Discharge Plan: Skilled Nursing Facility Barriers to Discharge: No SNF bed, Inadequate or no insurance               Expected Discharge Plan and Services                                               Social Drivers of Health (SDOH) Interventions SDOH Screenings   Food Insecurity: No Food Insecurity (07/16/2024)  Recent Concern: Food Insecurity - Food Insecurity Present (04/20/2024)  Housing: Low Risk (07/16/2024)  Transportation Needs: No Transportation Needs (07/16/2024)  Utilities: Not At Risk (07/16/2024)  Depression (PHQ2-9): Low Risk (06/30/2024)  Recent Concern: Depression (PHQ2-9) - Medium Risk (05/07/2024)  Social Connections: Moderately Integrated (07/13/2024)  Tobacco Use: Medium Risk (09/07/2024)    Readmission Risk Interventions    07/14/2024    2:34 PM 05/04/2024   10:00 AM  Readmission Risk Prevention Plan  Transportation Screening Complete Complete  PCP or Specialist Appt within 3-5 Days  Complete  HRI or Home Care Consult  Complete  Social Work Consult for Recovery Care Planning/Counseling  Complete  Palliative Care Screening  Not Applicable  Medication Review Oceanographer) Complete Complete  PCP or Specialist appointment within 3-5 days of discharge Complete   HRI or Home Care Consult Complete   SW Recovery Care/Counseling Consult Complete   Palliative Care Screening Not Applicable   Skilled Nursing Facility Not Applicable

## 2024-09-30 NOTE — Progress Notes (Signed)
 This chaplain responded to the unit's consult for prayer. The chaplain reviewed the Pt. chart notes before the visit.   The Pt. is resting without visible distress. The chaplain sits beside the Pt. during the visit. The Pt intermittently moans and attempts to speak as the chaplain talks about her family and the request for prayer. The Pt. eyes are open and her gaze stays on the chaplain during the prayer.  This chaplain is available for F/U spiritual care as needed.  Chaplain Leeroy Hummer [phone]

## 2024-10-01 LAB — GLUCOSE, CAPILLARY
Glucose-Capillary: 264 mg/dL — ABNORMAL HIGH (ref 70–99)
Glucose-Capillary: 208 mg/dL — ABNORMAL HIGH (ref 70–99)
Glucose-Capillary: 204 mg/dL — ABNORMAL HIGH (ref 70–99)
Glucose-Capillary: 145 mg/dL — ABNORMAL HIGH (ref 70–99)
Glucose-Capillary: 78 mg/dL (ref 70–99)

## 2024-10-01 NOTE — Plan of Care (Signed)
" °  Problem: Fluid Volume: Goal: Ability to maintain a balanced intake and output will improve Outcome: Progressing   Problem: Metabolic: Goal: Ability to maintain appropriate glucose levels will improve Outcome: Progressing   Problem: Nutritional: Goal: Maintenance of adequate nutrition will improve Outcome: Progressing   Problem: Tissue Perfusion: Goal: Adequacy of tissue perfusion will improve Outcome: Progressing   Problem: Clinical Measurements: Goal: Ability to maintain clinical measurements within normal limits will improve Outcome: Progressing Goal: Diagnostic test results will improve Outcome: Progressing Goal: Respiratory complications will improve Outcome: Progressing Goal: Cardiovascular complication will be avoided Outcome: Progressing   Problem: Activity: Goal: Risk for activity intolerance will decrease Outcome: Progressing   Problem: Nutrition: Goal: Adequate nutrition will be maintained Outcome: Progressing   Problem: Elimination: Goal: Will not experience complications related to bowel motility Outcome: Progressing Goal: Will not experience complications related to urinary retention Outcome: Progressing   Problem: Pain Managment: Goal: General experience of comfort will improve and/or be controlled Outcome: Progressing   Problem: Safety: Goal: Ability to remain free from injury will improve Outcome: Progressing   "

## 2024-10-01 NOTE — Progress Notes (Signed)
 " PROGRESS NOTE  Brenda [name]  FMW:[id number] DOB: 1960-10-03 DOA: 07/15/2024 PCP: [facility]   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  Transferred from [facility] to home for suspicion of seizures.  Started on Keppra .  Also received empiric antibiotics for concern of meningitis/encephalitis but CSF panel was negative so antibiotics discontinued underwent treatment with high-dose steroids followed by plasmapheresis.  Neurology was following.  Long length of stay.  Attempted to transfer to higher centers, declined.  PT/OT recommending SNF on discharge.LLOS.  TOC following.  Can be discharged to SNF if placement found. Palliative care also consulted for goals of care discussion due to her poor prognosis, quality of life.  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  Neurology was following.  Infectious etiology ruled out, antibiotics discontinued.  Concern for seizures was  started on Keppra .  Also underwent treatment with steroids, plasmapheresis.  Hospital course remarkable for worsening catatonia/stiffness.  MRI showed concerns for evolving infarct.  There was also concern for neurotoxicity secondary to Keytruda  so it was discontinued. -Status post PLEX x 5 days completed Dec 12/28 -Status post high-dose steroids 12/9 - 12/11 -Now on Keppra  500 mg twice daily - Repeat EEG unremarkable Attempted to transfer her to [facility] on 1/16 for second opinion, declined. Initially accepted for transfer to [facility] by Dr. Reta but their neurology team called again on 1/22 discussed the case in detail with Dr Voncile.  Eventually was declined. patient does not need any further neurology workup from their standpoint therefore declined transfer.  No plan for transfer now.  Left scalp ring lesion Vesicular rash around left back/axilla VZV infection Concern for tinea corporis, started on terbinafine , plan for 2 weeks of treatment.  Completion of therapy on 1/29 Seen by ID.  HSV PCR negative, VZV positive.  Completed course of  valacyclovir , last day of treatment on 1/25 Continue with airborne precaution until all lesions have scabbed over.  History of endometrial cancer/cancer-related pain: Seen by oncology, urgent care.  There is no clear evidence of progression of metastatic carcinoma as per imagings.  Keytruda  on hold.  Continue pain regimen  History of DVT/PE/splenic infarct: Anticoagulation on hold due to psoas hematoma, GI bleed.  Currently only on DVT prophylaxis.  Right hand/wrist swelling: Improved.  Continue elevation  GI bleed/acute on chronic anemia: Secondary to malignancy and also GI bleed.  Had bright blood red per rectum which has resolved.  CT GI bleed negative.  Required blood transfusion during this hospitalization.  Monitor hemoglobin intermittently, currently stable  History of Klebsiella UTI: Completed antibiotics course.  Dysphagia/moderate malnutrition:  Placed PEG by IR on 1/6.  Continue tube feeding  Diabetes type 2: Recent A1c of 7.2.  Continue current insulin  regimen.  Diabetic coordinator was following  History of depression/anxiety: On BuSpar , Ativan  as needed.  Prozac  discontinued due to rigidity  History of coronary artery disease/stroke/hyperlipidemia: On Lipitor .  Anticoagulation on hold  Abnormal thyroid  function test:Initially low TSH and elevated free T4, was started on propranolol . Repeat levels have normalized   Debility/deconditioning/disposition: PT/OT recommending SNF on discharge.  TOC following.  Difficulty in placement.  Goals of care: Patient remains confused, bedbound, encephalopathic.  Poor quality of life.  On tube feeding.  CODE STATUS DNR/DNI.   She is a candidate for comfort care.  Palliative care are planning to meet with husband on 1/30       Nutrition Problem: Moderate Malnutrition Etiology: chronic illness (cancer) Wound 08/11/24 0415 Pressure Injury Vertebral column Medial Stage 2 -  Partial thickness loss of dermis presenting as a shallow open injury with a red, pink wound bed without slough. (Active)     Wound 08/31/24 2100 Pressure Injury Thigh Left;Posterior Stage 1 -  Intact skin with non-blanchable redness of a localized area usually over a bony prominence. (Active)    DVT prophylaxis:enoxaparin  (LOVENOX ) injection 40 mg Start: 09/10/24 1000 Place and maintain sequential compression device Start: 08/02/24 0738     Code Status: Limited: Do not attempt resuscitation (DNR) -DNR-LIMITED -Do Not Intubate/DNI   Family Communication: Called and discussed with husband Brenda [name] on phone on 1/24.  Discussed with daughter Brenda [name] on phone on 1/26  Patient status:Inpatient  Patient is from :home  Anticipated discharge to:SNF?  Estimated DC date:whenever possible.  Long length of stay.  Ongoing goals of care discussion   Consultants: Neurology, oncology, palliative care  Procedures:  Plex  Antimicrobials:  Anti-infectives (From admission, onward)    Start     Dose/Rate Route Frequency Ordered Stop   09/18/24 1930  valACYclovir  (VALTREX ) tablet 1,000 mg        1,000 mg Oral 3 times daily 09/18/24 1756 09/25/24 1605   09/18/24 0915  fluconazole  (DIFLUCAN ) tablet 150 mg        150 mg Oral  Once 09/18/24 0820 09/18/24 1037   09/14/24 1730  fluconazole  (DIFLUCAN ) tablet 150 mg        150 mg Per Tube  Once 09/14/24 1643 09/14/24 1810   09/09/24 0800  cefTRIAXone  (ROCEPHIN ) 2 g in sodium chloride  0.9 % 100 mL IVPB        2 g 200 mL/hr over 30 Minutes Intravenous Every 24 hours 09/09/24 0624 09/12/24 0818   09/08/24 0831  ceFAZolin  (ANCEF ) IVPB 2g/100 mL premix        over 30 Minutes Intravenous Continuous PRN 09/08/24 0831 09/08/24 0856   08/16/24 1100  cefTRIAXone  (ROCEPHIN ) 2 g in sodium chloride  0.9 % 100 mL IVPB        2 g 200 mL/hr over 30 Minutes Intravenous Every 24 hours 08/16/24 1001 08/20/24 1214   07/23/24 0600  vancomycin  (VANCOCIN ) IVPB 1000 mg/200 mL premix  Status:  Discontinued        1,000 mg 200 mL/hr over 60 Minutes Intravenous Every 12 hours 07/22/24 1834 07/24/24 1808   07/22/24 2200  meropenem  (MERREM ) 2 g in sodium chloride  0.9 % 100 mL IVPB  Status:  Discontinued        2 g 280 mL/hr over 30 Minutes Intravenous Every 8 hours  07/22/24 1601 07/24/24 1808   07/22/24 1700  vancomycin  (VANCOREADY) IVPB 1500 mg/300 mL        1,500 mg 150 mL/hr over 120 Minutes Intravenous  Once 07/22/24 1601 07/22/24 2031   07/22/24 1645  ampicillin  (OMNIPEN) 2 g in sodium chloride  0.9 % 100 mL IVPB  Status:  Discontinued        2 g 300 mL/hr over 20 Minutes Intravenous Every 6 hours 07/22/24 1557 07/24/24 1800   07/19/24 0530  meropenem  (MERREM ) 1 g in sodium chloride  0.9 % 100 mL IVPB  Status:  Discontinued        1 g 200 mL/hr over 30 Minutes Intravenous Every 8 hours 07/19/24 0438 07/22/24 1601   07/16/24 0900  ceFEPIme  (MAXIPIME ) 2 g in sodium chloride  0.9 % 100  mL IVPB  Status:  Discontinued        2 g 200 mL/hr over 30 Minutes Intravenous Every 8 hours 07/16/24 0811 07/19/24 0409   07/16/24 0815  ceFEPIme  (MAXIPIME ) 2 g in sodium chloride  0.9 % 100 mL IVPB  Status:  Discontinued        2 g 200 mL/hr over 30 Minutes Intravenous  Once 07/16/24 0803 07/16/24 0811   07/16/24 0330  cefTRIAXone  (ROCEPHIN ) 2 g in sodium chloride  0.9 % 100 mL IVPB        2 g 200 mL/hr over 30 Minutes Intravenous  Once 07/16/24 0315 07/16/24 0412       Subjective: Patient seen and examined at bedside today.  Same as yesterday.  Lying on bed.  Eyes closed.  Moaning.  Not sure if she is in pain.  Does not look to be in distress.  Hemodynamically stable.  On 2 L of oxygen  per minute.  Objective: Vitals:   09/30/24 2312 09/30/24 2340 10/01/24 0309 10/01/24 0813  BP: (!) 184/58  (!) 131/53 (!) 147/55  Pulse: 88 (!) 109 87 81  Resp: 18 17 13 18   Temp: 98.2 F (36.8 C)  98.7 F (37.1 C) 98.6 F (37 C)  TempSrc: Oral  Oral Oral  SpO2: 97% 97% 99% 100%  Weight:      Height:        Intake/Output Summary (Last 24 hours) at 10/01/2024 1101 Last data filed at 10/01/2024 1000 Gross per 24 hour  Intake 737 ml  Output 600 ml  Net 137 ml   Filed Weights   09/07/24 0430 09/08/24 0500 09/19/24 0255  Weight: 87.1 kg 84.5 kg 85.7 kg    Examination:  General exam: Overall comfortable, not in distress, very deconditioned, chronically ill looking, weak, lying in bed HEENT: PERRL Respiratory system:  no wheezes or crackles, diminished air sounds in bilateral bases Cardiovascular system: S1 & S2 heard, RRR.  Port-A-Cath on the right chest Gastrointestinal system: Abdomen is nondistended, soft and nontender.  PEG Central nervous system: Not alert or oriented Extremities: Contracture of left upper extremity, global weakness Skin: Varicella rash on the back GU: Foley   Data Reviewed: I have personally reviewed following labs and imaging studies  CBC: Recent Labs  Lab  09/26/24 0500 09/30/24 0500  WBC 7.6 10.3  HGB 10.9* 11.9*  HCT 32.8* 35.6*  MCV 90.9 91.8  PLT 298 335   Basic Metabolic Panel: Recent Labs  Lab 09/26/24 0500  NA 133*  K 4.3  CL 95*  CO2 31  GLUCOSE 302*  BUN 13  CREATININE 0.38*  CALCIUM  9.3     No results found for this or any previous  visit (from the past 240 hours).    Radiology Studies: No results found.   Scheduled Meds:  atorvastatin   80 mg Per Tube QHS   busPIRone   5 mg Oral BID   Chlorhexidine  Gluconate Cloth  6 each Topical Daily   enoxaparin  (LOVENOX ) injection  40 mg Subcutaneous Q24H   famotidine   20 mg Per Tube BID   feeding supplement (GLUCERNA 1.5 CAL)  237 mL Per Tube TID WC & HS   feeding supplement (PROSource TF20)  60 mL Per Tube Daily   fentaNYL   1 patch Transdermal Q72H   FLUoxetine   40 mg Per Tube Daily   free water   100 mL Per Tube 5 X Daily   gabapentin   200 mg Per Tube BID   insulin  aspart  0-5 Units Subcutaneous QHS   insulin  aspart  0-9 Units Subcutaneous TID WC   insulin  aspart  5 Units Subcutaneous TID WC   insulin  glargine  35 Units Subcutaneous Daily   levETIRAcetam   500 mg Per Tube BID   lidocaine   1 patch Transdermal Daily   nystatin   100,000 Units Topical TID   mouth rinse  15 mL Mouth Rinse 4 times per day   oxyCODONE   5 mg Per Tube BID   polyethylene glycol  17 g Per Tube Daily   sodium chloride  flush  10-40 mL Intracatheter Q12H   terbinafine   1 Application Topical BID   Continuous Infusions:   LOS: 76 days   Ivonne Mustache, MD [facility] P1/29/2026, 11:01 AM  "

## 2024-10-01 NOTE — Plan of Care (Signed)
  Problem: Coping: Goal: Ability to adjust to condition or change in health will improve Outcome: Progressing   Problem: Fluid Volume: Goal: Ability to maintain a balanced intake and output will improve Outcome: Progressing   Problem: Health Behavior/Discharge Planning: Goal: Ability to manage health-related needs will improve Outcome: Progressing   Problem: Metabolic: Goal: Ability to maintain appropriate glucose levels will improve Outcome: Progressing

## 2024-10-02 DIAGNOSIS — J9611 Chronic respiratory failure with hypoxia: Secondary | ICD-10-CM | POA: Diagnosis not present

## 2024-10-02 DIAGNOSIS — Z66 Do not resuscitate: Secondary | ICD-10-CM | POA: Diagnosis not present

## 2024-10-02 DIAGNOSIS — Z515 Encounter for palliative care: Secondary | ICD-10-CM | POA: Diagnosis not present

## 2024-10-02 DIAGNOSIS — N390 Urinary tract infection, site not specified: Secondary | ICD-10-CM | POA: Diagnosis not present

## 2024-10-02 LAB — CBC
HCT: 32.8 % — ABNORMAL LOW (ref 36.0–46.0)
Hemoglobin: 10.9 g/dL — ABNORMAL LOW (ref 12.0–15.0)
MCH: 30.8 pg (ref 26.0–34.0)
MCHC: 33.2 g/dL (ref 30.0–36.0)
MCV: 92.7 fL (ref 80.0–100.0)
Platelets: 273 10*3/uL (ref 150–400)
RBC: 3.54 MIL/uL — ABNORMAL LOW (ref 3.87–5.11)
RDW: 14.7 % (ref 11.5–15.5)
WBC: 8.5 10*3/uL (ref 4.0–10.5)
nRBC: 0 % (ref 0.0–0.2)

## 2024-10-02 LAB — GLUCOSE, CAPILLARY
Glucose-Capillary: 153 mg/dL — ABNORMAL HIGH (ref 70–99)
Glucose-Capillary: 210 mg/dL — ABNORMAL HIGH (ref 70–99)
Glucose-Capillary: 150 mg/dL — ABNORMAL HIGH (ref 70–99)
Glucose-Capillary: 113 mg/dL — ABNORMAL HIGH (ref 70–99)

## 2024-10-02 LAB — BASIC METABOLIC PANEL WITH GFR
Anion gap: 8 (ref 5–15)
BUN: 19 mg/dL (ref 8–23)
CO2: 33 mmol/L — ABNORMAL HIGH (ref 22–32)
Calcium: 9 mg/dL (ref 8.9–10.3)
Chloride: 96 mmol/L — ABNORMAL LOW (ref 98–111)
Creatinine, Ser: 0.36 mg/dL — ABNORMAL LOW (ref 0.44–1.00)
GFR, Estimated: >60 mL/min
Glucose, Bld: 120 mg/dL — ABNORMAL HIGH (ref 70–99)
Potassium: 4.1 mmol/L (ref 3.5–5.1)
Sodium: 136 mmol/L (ref 135–145)

## 2024-10-02 NOTE — Progress Notes (Signed)
 " PROGRESS NOTE  Brenda [name]  FMW:[id number] DOB: 1961/06/23 DOA: 07/15/2024 PCP: [facility]   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  Transferred from [facility] to home for suspicion of seizures.  Started on Keppra .  Also received empiric antibiotics for concern of meningitis/encephalitis but CSF panel was negative so antibiotics discontinued underwent treatment with high-dose steroids followed by plasmapheresis.  Neurology was following.  Long length of stay.  Attempted to transfer to higher centers, declined.  PT/OT recommending SNF on discharge.LLOS.  TOC following.  Can be discharged to SNF if placement found. Palliative care also consulted for goals of care discussion due to her poor prognosis, quality of life.  Palliative care planning to meet with the family today.  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  Neurology was following.  Infectious etiology ruled  out, antibiotics discontinued.  Concern for seizures was started on Keppra .  Also underwent treatment with steroids, plasmapheresis.  Hospital course remarkable for worsening catatonia/stiffness.  MRI showed concerns for evolving infarct.  There was also concern for neurotoxicity secondary to Keytruda  so it was discontinued. -Status post PLEX x 5 days completed Dec 12/28 -Status post high-dose steroids 12/9 - 12/11 -Now on Keppra  500 mg twice daily - Repeat EEG unremarkable Attempted to transfer her to [facility] on 1/16 for second opinion, declined. Initially accepted for transfer to [facility] by Dr. Reta but their neurology team called again on 1/22 discussed the case in detail with Dr Voncile.  Eventually was declined. patient does not need any further neurology workup from their standpoint therefore declined transfer.  No plan for transfer now.  Left scalp ring lesion Vesicular rash around left back/axilla VZV infection Concern for tinea corporis, started on terbinafine , plan for 2 weeks of treatment.  Completion of therapy on 1/29 Seen by ID.  HSV PCR negative, VZV positive.  Completed course of  valacyclovir , last day of treatment on 1/25 Continue with airborne precaution until all lesions have scabbed over.  History of endometrial cancer/cancer-related pain: Seen by oncology, urgent care.  There is no clear evidence of progression of metastatic carcinoma as per imagings.  Keytruda  on hold.  Continue pain regimen  History of DVT/PE/splenic infarct: Anticoagulation on hold due to psoas hematoma, GI bleed.  Currently only on DVT prophylaxis.  Right hand/wrist swelling: Improved.  Continue elevation  GI bleed/acute on chronic anemia: Secondary to malignancy and also GI bleed.  Had bright blood red per rectum which has resolved.  CT GI bleed negative.  Required blood transfusion  during this hospitalization.  Monitor hemoglobin intermittently, currently stable  History of Klebsiella UTI: Completed  antibiotics course.  Dysphagia/moderate malnutrition: Placed PEG by IR on 1/6.  Continue tube feeding  Diabetes type 2: Recent A1c of 7.2.  Continue current insulin  regimen.  Diabetic coordinator was following  History of depression/anxiety: On BuSpar , Ativan  as needed.  Prozac  discontinued due to rigidity  History of coronary artery disease/stroke/hyperlipidemia: On Lipitor .  Anticoagulation on hold  Abnormal thyroid  function test:Initially low TSH and elevated free T4, was started on propranolol . Repeat levels have normalized   Debility/deconditioning/disposition: PT/OT recommending SNF on discharge.  TOC following.  Difficulty in placement.  Goals of care: Patient remains confused, bedbound, encephalopathic.  Poor quality of life.  On tube feeding.  CODE STATUS DNR/DNI.   She is a candidate for comfort care.  Palliative care are planning to meet with husband on 1/30       Nutrition Problem: Moderate Malnutrition Etiology: chronic illness (cancer) Wound 08/11/24 0415 Pressure Injury Vertebral column Medial Stage 2 -  Partial thickness loss of dermis presenting as a shallow open injury with a red, pink wound bed without slough. (Active)     Wound 08/31/24 2100 Pressure Injury Thigh Left;Posterior Stage 1 -  Intact skin with non-blanchable redness of a localized area usually over a bony prominence. (Active)    DVT prophylaxis:enoxaparin  (LOVENOX ) injection 40 mg Start: 09/10/24 1000 Place and maintain sequential compression device Start: 08/02/24 0738     Code Status: Limited: Do not attempt resuscitation (DNR) -DNR-LIMITED -Do Not Intubate/DNI   Family Communication: Called and discussed with husband Brenda [name] on phone on 1/24.  Discussed with daughter Brenda [name] on phone on 1/26  Patient status:Inpatient  Patient is from :home  Anticipated discharge to:SNF?  Estimated DC date:whenever possible.  Long length of stay.  Ongoing goals of care discussion   Consultants:  Neurology, oncology, palliative care  Procedures: Plex  Antimicrobials:  Anti-infectives (From admission, onward)    Start     Dose/Rate Route Frequency Ordered Stop   09/18/24 1930  valACYclovir  (VALTREX ) tablet 1,000 mg        1,000 mg Oral 3 times daily 09/18/24 1756 09/25/24 1605   09/18/24 0915  fluconazole  (DIFLUCAN ) tablet 150 mg        150 mg Oral  Once 09/18/24 0820 09/18/24 1037   09/14/24 1730  fluconazole  (DIFLUCAN ) tablet 150 mg        150 mg Per Tube  Once 09/14/24 1643 09/14/24 1810   09/09/24 0800  cefTRIAXone  (ROCEPHIN ) 2 g in sodium chloride  0.9 % 100 mL IVPB        2 g 200 mL/hr over 30 Minutes Intravenous Every 24 hours 09/09/24 0624 09/12/24 0818   09/08/24 0831  ceFAZolin  (ANCEF ) IVPB 2g/100 mL premix        over 30 Minutes Intravenous Continuous PRN 09/08/24 0831 09/08/24 0856   08/16/24 1100  cefTRIAXone  (ROCEPHIN ) 2 g in sodium chloride  0.9 % 100 mL IVPB        2 g 200 mL/hr over 30 Minutes Intravenous Every 24 hours 08/16/24 1001 08/20/24 1214   07/23/24 0600  vancomycin  (VANCOCIN ) IVPB 1000 mg/200 mL premix  Status:  Discontinued        1,000 mg 200 mL/hr over 60 Minutes Intravenous Every 12 hours 07/22/24 1834 07/24/24 1808   07/22/24 2200  meropenem  (MERREM ) 2 g in sodium chloride  0.9 % 100 mL IVPB  Status:  Discontinued        2  g 280 mL/hr over 30 Minutes Intravenous Every 8 hours 07/22/24 1601 07/24/24 1808   07/22/24 1700  vancomycin  (VANCOREADY) IVPB 1500 mg/300 mL        1,500 mg 150 mL/hr over 120 Minutes Intravenous  Once 07/22/24 1601 07/22/24 2031   07/22/24 1645  ampicillin  (OMNIPEN) 2 g in sodium chloride  0.9 % 100 mL IVPB  Status:  Discontinued        2 g 300 mL/hr over 20 Minutes Intravenous Every 6 hours 07/22/24 1557 07/24/24 1800   07/19/24 0530  meropenem  (MERREM ) 1 g in sodium chloride  0.9 % 100 mL IVPB  Status:  Discontinued        1 g 200 mL/hr over 30 Minutes Intravenous Every 8 hours 07/19/24 0438 07/22/24 1601   07/16/24 0900   ceFEPIme  (MAXIPIME ) 2 g in sodium chloride  0.9 % 100 mL IVPB  Status:  Discontinued        2 g 200 mL/hr over 30 Minutes Intravenous Every 8 hours 07/16/24 0811 07/19/24 0409   07/16/24 0815  ceFEPIme  (MAXIPIME ) 2 g in sodium chloride  0.9 % 100 mL IVPB  Status:  Discontinued        2 g 200 mL/hr over 30 Minutes Intravenous  Once 07/16/24 0803 07/16/24 0811   07/16/24 0330  cefTRIAXone  (ROCEPHIN ) 2 g in sodium chloride  0.9 % 100 mL IVPB        2 g 200 mL/hr over 30 Minutes Intravenous  Once 07/16/24 0315 07/16/24 0412       Subjective: Patient seen and examined at bedside today.  Hemodynamically stable.  Being fed through PEG tube.  Lying on bed.  Very deconditioned and weak.  Moaning.  Remains disoriented  Objective: Vitals:   10/01/24 2152 10/01/24 2342 10/02/24 0515 10/02/24 0908  BP: (!) 127/43 126/61 128/64 (!) 146/51  Pulse: 70 61 74 85  Resp: 15 12 15 19   Temp: 98 F (36.7 C) 98.1 F (36.7 C) 98.3 F (36.8 C) 98.5 F (36.9 C)  TempSrc: Oral Oral Oral Oral  SpO2: 100% 100% 100% 100%  Weight:      Height:        Intake/Output Summary (Last 24 hours) at 10/02/2024 1124 Last data filed at 10/02/2024 0905 Gross per 24 hour  Intake 840 ml  Output 650 ml  Net 190 ml   Filed Weights   09/07/24 0430 09/08/24 0500 09/19/24 0255  Weight: 87.1 kg 84.5 kg 85.7 kg    Examination:  General exam: Overall comfortable, not in distress, very deconditioned, chronically ill looking, weak, lying on bed HEENT: PERRL Respiratory system:  no wheezes or crackles, diminished air sounds on bases Cardiovascular system: S1 & S2 heard, RRR.  Gastrointestinal system: Abdomen is nondistended, soft and nontender.  PEG Central nervous system: Not  alert or oriented Extremities: No edema, no clubbing ,no cyanosis, contracture of left upper extremity Skin: varicella rash on back GU: Foley     Data Reviewed: I have personally reviewed following labs and imaging studies  CBC: Recent Labs   Lab 09/26/24 0500 09/30/24 0500 10/02/24 0515  WBC 7.6 10.3 8.5  HGB 10.9* 11.9* 10.9*  HCT 32.8* 35.6* 32.8*  MCV 90.9 91.8 92.7  PLT 298 335 273   Basic Metabolic Panel: Recent Labs  Lab 09/26/24 0500 10/02/24 0515  NA 133* 136  K 4.3 4.1  CL 95* 96*  CO2 31 33*  GLUCOSE 302* 120*  BUN 13 19  CREATININE 0.38* 0.36*  CALCIUM  9.3 9.0  No results found for this or any previous visit (from the past 240 hours).    Radiology Studies: No results found.   Scheduled Meds:  atorvastatin   80 mg Per Tube QHS   busPIRone   5 mg Oral BID   Chlorhexidine  Gluconate Cloth  6 each Topical Daily   enoxaparin  (LOVENOX ) injection  40 mg Subcutaneous Q24H   famotidine   20 mg Per Tube BID   feeding supplement (GLUCERNA 1.5 CAL)  237 mL Per Tube TID WC & HS   feeding supplement (PROSource TF20)  60 mL Per Tube Daily   fentaNYL   1 patch Transdermal Q72H   FLUoxetine   40 mg Per Tube Daily   free water   100 mL Per Tube 5 X Daily   gabapentin   200 mg Per Tube BID   insulin  aspart  0-5 Units Subcutaneous QHS   insulin  aspart  0-9 Units Subcutaneous TID WC   insulin  aspart  5 Units Subcutaneous TID WC   insulin  glargine  35 Units Subcutaneous Daily   levETIRAcetam   500 mg Per Tube BID   lidocaine   1 patch Transdermal Daily   nystatin   100,000 Units Topical TID   mouth rinse  15 mL Mouth Rinse 4 times per day   oxyCODONE   5 mg Per Tube BID   polyethylene glycol  17 g Per Tube Daily   sodium chloride  flush  10-40 mL Intracatheter Q12H   Continuous Infusions:   LOS: 77 days   Ivonne Mustache, MD [facility] P1/30/2026, 11:24 AM  "

## 2024-10-02 NOTE — Plan of Care (Signed)
" °  Problem: Fluid Volume: Goal: Ability to maintain a balanced intake and output will improve Outcome: Progressing   Problem: Metabolic: Goal: Ability to maintain appropriate glucose levels will improve Outcome: Progressing   Problem: Skin Integrity: Goal: Risk for impaired skin integrity will decrease Outcome: Progressing   Problem: Tissue Perfusion: Goal: Adequacy of tissue perfusion will improve Outcome: Progressing   Problem: Clinical Measurements: Goal: Ability to maintain clinical measurements within normal limits will improve Outcome: Progressing Goal: Will remain free from infection Outcome: Progressing Goal: Diagnostic test results will improve Outcome: Progressing Goal: Respiratory complications will improve Outcome: Progressing Goal: Cardiovascular complication will be avoided Outcome: Progressing   Problem: Elimination: Goal: Will not experience complications related to bowel motility Outcome: Progressing Goal: Will not experience complications related to urinary retention Outcome: Progressing   Problem: Pain Managment: Goal: General experience of comfort will improve and/or be controlled Outcome: Progressing   Problem: Safety: Goal: Ability to remain free from injury will improve Outcome: Progressing   "

## 2024-10-03 LAB — GLUCOSE, CAPILLARY
Glucose-Capillary: 100 mg/dL — ABNORMAL HIGH (ref 70–99)
Glucose-Capillary: 207 mg/dL — ABNORMAL HIGH (ref 70–99)
Glucose-Capillary: 216 mg/dL — ABNORMAL HIGH (ref 70–99)
Glucose-Capillary: 132 mg/dL — ABNORMAL HIGH (ref 70–99)

## 2024-10-03 NOTE — Plan of Care (Signed)
" °  Problem: Education: Goal: Ability to describe self-care measures that may prevent or decrease complications (Diabetes Survival Skills Education) will improve Outcome: Progressing Goal: Individualized Educational Video(s) Outcome: Progressing   Problem: Coping: Goal: Ability to adjust to condition or change in health will improve Outcome: Progressing   Problem: Fluid Volume: Goal: Ability to maintain a balanced intake and output will improve Outcome: Progressing   Problem: Health Behavior/Discharge Planning: Goal: Ability to identify and utilize available resources and services will improve Outcome: Progressing Goal: Ability to manage health-related needs will improve Outcome: Progressing   Problem: Metabolic: Goal: Ability to maintain appropriate glucose levels will improve Outcome: Progressing   Problem: Skin Integrity: Goal: Risk for impaired skin integrity will decrease Outcome: Progressing   Problem: Tissue Perfusion: Goal: Adequacy of tissue perfusion will improve Outcome: Progressing   Problem: Education: Goal: Knowledge of General Education information will improve Description: Including pain rating scale, medication(s)/side effects and non-pharmacologic comfort measures Outcome: Progressing   Problem: Health Behavior/Discharge Planning: Goal: Ability to manage health-related needs will improve Outcome: Progressing   Problem: Clinical Measurements: Goal: Ability to maintain clinical measurements within normal limits will improve Outcome: Progressing Goal: Will remain free from infection Outcome: Progressing Goal: Diagnostic test results will improve Outcome: Progressing Goal: Respiratory complications will improve Outcome: Progressing Goal: Cardiovascular complication will be avoided Outcome: Progressing   Problem: Activity: Goal: Risk for activity intolerance will decrease Outcome: Progressing   "

## 2024-10-03 NOTE — Progress Notes (Signed)
 " PROGRESS NOTE    Brenda [name]  FMW:[id number] DOB: March 18, 1961 DOA: 07/15/2024 PCP: [facility]   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  Transferred from [facility] to home for suspicion of seizures.  Started on Keppra .  Also received empiric antibiotics for concern of meningitis/encephalitis but CSF panel was negative so antibiotics discontinued underwent treatment with high-dose steroids followed by plasmapheresis.  Neurology was following.  Long length of stay.  Attempted to transfer to higher centers, declined.  PT/OT recommending SNF on discharge.LLOS.  TOC following.  Can be discharged to SNF if placement found. Palliative care also consulted for goals of care discussion due to her poor prognosis and quality of life.   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  Neurology was following.  Infectious etiology ruled out, antibiotics discontinued.  Concern for seizures was  started on Keppra .  Also underwent treatment with steroids, plasmapheresis.  Hospital course remarkable for worsening catatonia/stiffness.  MRI showed concerns for evolving infarct.  There was also concern for neurotoxicity secondary to Keytruda  so it was discontinued. -Status post PLEX x 5 days completed Dec 12/28 -Status post high-dose steroids 12/9 - 12/11 -Now on Keppra  500 mg twice daily - Repeat EEG unremarkable Attempted to transfer her to [facility] on 1/16 for second opinion, declined. Initially accepted for transfer to [facility] by Dr. Reta but their neurology team called again on 1/22 discussed the case in detail with Dr Voncile.  Eventually was declined. patient does not need any further neurology workup from their standpoint therefore declined transfer.  No plan for transfer now.   Left scalp ring lesion Vesicular rash around left back/axilla VZV infection Concern for tinea corporis, started on terbinafine , plan for 2 weeks of treatment.  Completion of therapy on 1/29 Seen by ID.  HSV PCR negative, VZV positive.  Completed course of  valacyclovir , last day of treatment on 1/25 Continue with airborne precaution until all lesions have scabbed over.   History of endometrial cancer/cancer-related pain: Seen by oncology, urgent care.  There is no clear evidence of progression of metastatic carcinoma as per imagings.  Keytruda  on hold.  Continue pain regimen   History of DVT/PE/splenic infarct: Anticoagulation on hold due to psoas hematoma, GI bleed.  Currently only on DVT prophylaxis.   Right hand/wrist swelling: Improved.  Continue elevation   GI bleed/acute on chronic anemia: Secondary to malignancy and also GI bleed.  Had bright blood red per rectum which has resolved.  CT GI bleed negative.  Required blood transfusion  during this hospitalization.  Monitor hemoglobin intermittently, currently stable   History of Klebsiella UTI: Completed antibiotics course.   Dysphagia/moderate  malnutrition: Placed PEG by IR on 1/6.  Continue tube feeding   Diabetes type 2: Recent A1c of 7.2.  Continue current insulin  regimen.  Diabetic coordinator was following   History of depression/anxiety: On BuSpar , Ativan  as needed.  Prozac  discontinued due to rigidity   History of coronary artery disease/stroke/hyperlipidemia: On Lipitor .  Anticoagulation on hold   Abnormal thyroid  function test:Initially low TSH and elevated free T4, was started on propranolol . Repeat levels have normalized    Debility/deconditioning/disposition: PT/OT recommending SNF on discharge.  TOC following.  Difficulty in placement.   Goals of care: Patient remains confused, bedbound, encephalopathic.  Poor quality of life.  On tube feeding.  CODE STATUS DNR/DNI.   She is a candidate for comfort care.  Palliative team on board, appreciate assistance   DVT prophylaxis: enoxaparin  (LOVENOX ) injection 40 mg Start: 09/10/24 1000 Place and maintain sequential compression device Start: 08/02/24 0738     Code Status: Limited: Do not attempt resuscitation (DNR) -DNR-LIMITED -Do Not Intubate/DNI  Family Communication: None at the bedside Status is: Inpatient Remains inpatient appropriate because: Encephalopathy    Subjective:  No acute issues overnight.  She appears confused and drowsy.  She was yelling either yes or I do not know  to most of the questions.  Examination:  General exam: Appears confused, mild distress noted, nasal oxygen  cannula in place Respiratory system: Clear to auscultation. Respiratory effort normal. Cardiovascular system: S1 & S2 heard, RRR. No JVD, murmurs, rubs, gallops or clicks. No pedal edema. Gastrointestinal system: Abdomen is nondistended, PEG tube in place Central nervous system: Alert and oriented. No focal neurological deficits. Extremities: Unable to assess power because of altered mentation Skin: Rash on the back That urinary: Foley's catheter in place  Wound 08/11/24  0415 Pressure Injury Vertebral column Medial Stage 2 -  Partial thickness loss of dermis presenting as a shallow open injury with a red, pink wound bed without slough. (Active)     Wound 08/31/24 2100 Pressure Injury Thigh Left;Posterior Stage 1 -  Intact skin with non-blanchable redness of a localized area usually over a bony prominence. (Active)     Diet Orders (From admission, onward)     Start     Ordered   09/27/24 1855  DIET DYS 2 Room service appropriate? Yes; Fluid consistency: Thin  Diet effective now       Question Answer Comment  Room service appropriate? Yes   Fluid consistency: Thin      09/27/24 1855            Objective: Vitals:   10/03/24 0112 10/03/24 0311 10/03/24 0609 10/03/24 0813  BP: (!) 122/41 (!) 109/37 (!) 140/48 (!) 157/62  Pulse:  (!) 56 69 88  Resp:  12 16 20   Temp:  98 F (36.7 C)  97.6 F (36.4 C)  TempSrc:  Oral  Oral  SpO2:  100% 100% 100%  Weight:      Height:        Intake/Output Summary (Last 24 hours) at 10/03/2024 0929 Last data filed at 10/03/2024 0606 Gross per 24 hour  Intake 767 ml  Output 1100 ml  Net -333 ml   Filed Weights   09/07/24 0430 09/08/24 0500 09/19/24 0255  Weight: 87.1 kg 84.5 kg 85.7 kg    Scheduled Meds:  atorvastatin   80 mg Per Tube QHS   busPIRone   5  mg Oral BID   Chlorhexidine  Gluconate Cloth  6 each Topical Daily   enoxaparin  (LOVENOX ) injection  40 mg Subcutaneous Q24H   famotidine   20 mg Per Tube BID   feeding supplement (GLUCERNA 1.5 CAL)  237 mL Per Tube TID WC & HS   feeding supplement (PROSource TF20)  60 mL Per Tube Daily   fentaNYL   1 patch Transdermal Q72H   FLUoxetine   40 mg Per Tube Daily   free water   100 mL Per Tube 5 X Daily   gabapentin   200 mg Per Tube BID   insulin  aspart  0-5 Units Subcutaneous QHS   insulin  aspart  0-9 Units Subcutaneous TID WC   insulin  aspart  5 Units Subcutaneous TID WC   insulin  glargine  35 Units Subcutaneous Daily   levETIRAcetam   500 mg Per Tube BID    lidocaine   1 patch Transdermal Daily   nystatin   100,000 Units Topical TID   mouth rinse  15 mL Mouth Rinse 4 times per day   oxyCODONE   5 mg Per Tube BID   polyethylene glycol  17 g Per Tube Daily   sodium chloride  flush  10-40 mL Intracatheter Q12H   Continuous Infusions:  Nutritional status Signs/Symptoms: percent weight loss, mild muscle depletion, moderate muscle depletion (12% x 6 months) Percent weight loss: 12 % Interventions: Prostat, Tube feeding, MVI Body mass index is 34.57 kg/m.  Data Reviewed:   CBC: Recent Labs  Lab 09/30/24 0500 10/02/24 0515  WBC 10.3 8.5  HGB 11.9* 10.9*  HCT 35.6* 32.8*  MCV 91.8 92.7  PLT 335 273   Basic Metabolic Panel: Recent Labs  Lab 10/02/24 0515  NA 136  K 4.1  CL 96*  CO2 33*  GLUCOSE 120*  BUN 19  CREATININE 0.36*  CALCIUM  9.0   GFR: Estimated Creatinine Clearance: 75 mL/min (A) (by C-G formula based on SCr of 0.36 mg/dL (L)). Liver Function Tests: No results for input(s): AST, ALT, ALKPHOS, BILITOT, PROT, ALBUMIN  in the last 168 hours. No results for input(s): LIPASE, AMYLASE in the last 168 hours. No results for input(s): AMMONIA in the last 168 hours. Coagulation Profile: No results for input(s): INR, PROTIME in the last 168 hours. Cardiac Enzymes: No results for input(s): CKTOTAL, CKMB, CKMBINDEX, TROPONINI in the last 168 hours. BNP (last 3 results) Recent Labs    05/22/24 0630  PROBNP 3,514.0*   HbA1C: No results for input(s): HGBA1C in the last 72 hours. CBG: Recent Labs  Lab 10/02/24 0649 10/02/24 1218 10/02/24 1619 10/02/24 2114 10/03/24 0556  GLUCAP 153* 210* 150* 113* 100*   Lipid Profile: No results for input(s): CHOL, HDL, LDLCALC, TRIG, CHOLHDL, LDLDIRECT in the last 72 hours. Thyroid  Function Tests: No results for input(s): TSH, T4TOTAL, FREET4, T3FREE, THYROIDAB in the last 72 hours. Anemia Panel: No results for input(s):  VITAMINB12, FOLATE, FERRITIN, TIBC, IRON , RETICCTPCT in the last 72 hours. Sepsis Labs: No results for input(s): PROCALCITON, LATICACIDVEN in the last 168 hours.  No results found for this or any previous visit (from the past 240 hours).       Radiology Studies: No results found.         LOS: 78 days   Time spent= 35 mins    Deliliah Room, MD [facility]  If 7PM-7AM, please contact night-coverage  10/03/2024, 9:29 AM  "

## 2024-10-04 LAB — GLUCOSE, CAPILLARY
Glucose-Capillary: 161 mg/dL — ABNORMAL HIGH (ref 70–99)
Glucose-Capillary: 142 mg/dL — ABNORMAL HIGH (ref 70–99)
Glucose-Capillary: 353 mg/dL — ABNORMAL HIGH (ref 70–99)
Glucose-Capillary: 357 mg/dL — ABNORMAL HIGH (ref 70–99)
Glucose-Capillary: 324 mg/dL — ABNORMAL HIGH (ref 70–99)
Glucose-Capillary: 275 mg/dL — ABNORMAL HIGH (ref 70–99)

## 2024-10-04 NOTE — Progress Notes (Signed)
 " PROGRESS NOTE    Brenda [name]  FMW:[id number] DOB: Aug 05, 1961 DOA: 07/15/2024 PCP: [facility]   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  Transferred from [facility] to home for suspicion of seizures.  Started on Keppra .  Also received empiric antibiotics for concern of meningitis/encephalitis but CSF panel was negative so antibiotics discontinued underwent treatment with high-dose steroids followed by plasmapheresis.  Neurology was following.  Long length of stay.  Attempted to transfer to higher centers, declined.  PT/OT recommending SNF on discharge.LLOS.  TOC following.  Can be discharged to SNF if placement found. Palliative care also consulted for goals of care discussion due to her poor prognosis and quality of life.   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  Neurology was following.  Infectious etiology ruled out, antibiotics discontinued.  Concern for seizures was  started on Keppra .  Also underwent treatment with steroids, plasmapheresis.  Hospital course remarkable for worsening catatonia/stiffness.  MRI showed concerns for evolving infarct.  There was also concern for neurotoxicity secondary to Keytruda  so it was discontinued. -Status post PLEX x 5 days completed Dec 12/28 -Status post high-dose steroids 12/9 - 12/11 -Now on Keppra  500 mg twice daily - Repeat EEG unremarkable Attempted to transfer her to [facility] on 1/16 for second opinion, declined. Initially accepted for transfer to [facility] by Dr. Reta but their neurology team called again on 1/22 discussed the case in detail with Dr Voncile.  Eventually was declined. patient does not need any further neurology workup from their standpoint therefore declined transfer.  No plan for transfer now.   Left scalp ring lesion Vesicular rash around left back/axilla VZV infection Concern for tinea corporis, started on terbinafine , plan for 2 weeks of treatment.  Completion of therapy on 1/29 Seen by ID.  HSV PCR negative, VZV positive.  Completed course of  valacyclovir , last day of treatment on 1/25 Continue with airborne precaution until all lesions have scabbed over.   History of endometrial cancer/cancer-related pain: Seen by oncology, urgent care.  There is no clear evidence of progression of metastatic carcinoma as per imagings.  Keytruda  on hold.  Continue pain regimen   History of DVT/PE/splenic infarct: Anticoagulation on hold due to psoas hematoma, GI bleed.  Currently only on DVT prophylaxis.   Right hand/wrist swelling: Improved.  Continue elevation   GI bleed/acute on chronic anemia: Secondary to malignancy and also GI bleed.  Had bright blood red per rectum which has resolved.  CT GI bleed negative.  Required blood transfusion  during this hospitalization.  Monitor hemoglobin intermittently, currently stable   History of Klebsiella UTI: Completed antibiotics course.   Dysphagia/moderate  malnutrition: Placed PEG by IR on 1/6.  Continue tube feeding   Diabetes type 2: Recent A1c of 7.2.  Continue current insulin  regimen.  Diabetic coordinator was following   History of depression/anxiety: On BuSpar , Ativan  as needed.  Prozac  discontinued due to rigidity   History of coronary artery disease/stroke/hyperlipidemia: On Lipitor .  Anticoagulation on hold   Abnormal thyroid  function test:Initially low TSH and elevated free T4, was started on propranolol . Repeat levels have normalized    Debility/deconditioning/disposition: PT/OT recommending SNF on discharge.  TOC following.  Difficulty in placement.   Goals of care: Patient remains confused, bedbound, encephalopathic.  Poor quality of life.  On tube feeding.  CODE STATUS DNR/DNI.   She is a candidate for comfort care.  Palliative team on board, appreciate assistance   DVT prophylaxis: enoxaparin  (LOVENOX ) injection 40 mg Start: 09/10/24 1000 Place and maintain sequential compression device Start: 08/02/24 0738     Code Status: Limited: Do not attempt resuscitation (DNR) -DNR-LIMITED -Do Not Intubate/DNI  Family Communication: None at the bedside Status is: Inpatient Remains inpatient appropriate because: Encephalopathy    Subjective:  No acute issues overnight. Mentation remained the same. No acute distress noted.  Examination:  General exam: Appears confused, no distress noted, nasal oxygen  cannula in place Respiratory system: Clear to auscultation. Respiratory effort normal. Cardiovascular system: S1 & S2 heard, RRR. No JVD, murmurs, rubs, gallops or clicks. No pedal edema. Gastrointestinal system: Abdomen is nondistended, PEG tube in place Central nervous system: Alert and oriented. No focal neurological deficits. Extremities: Unable to assess power because of altered mentation Skin: Rash on the back Genitourinary: Foley's catheter in place  Wound 08/11/24 0415 Pressure Injury Vertebral column Medial Stage 2 -   Partial thickness loss of dermis presenting as a shallow open injury with a red, pink wound bed without slough. (Active)     Wound 08/31/24 2100 Pressure Injury Thigh Left;Posterior Stage 1 -  Intact skin with non-blanchable redness of a localized area usually over a bony prominence. (Active)     Diet Orders (From admission, onward)     Start     Ordered   09/27/24 1855  DIET DYS 2 Room service appropriate? Yes; Fluid consistency: Thin  Diet effective now       Question Answer Comment  Room service appropriate? Yes   Fluid consistency: Thin      09/27/24 1855            Objective: Vitals:   10/03/24 1700 10/03/24 1938 10/04/24 0000 10/04/24 0400  BP: (!) 156/46 (!) 167/44 (!) 165/41 (!) 131/41  Pulse: 86 88 94 (!) 58  Resp: 16 18 (!) 21 15  Temp: 98.6 F (37 C) 98.7 F (37.1 C)  98.4 F (36.9 C)  TempSrc: Oral Axillary  Oral  SpO2: 100% 100% 100% 100%  Weight:      Height:        Intake/Output Summary (Last 24 hours) at 10/04/2024 0710 Last data filed at 10/04/2024 0456 Gross per 24 hour  Intake 500 ml  Output 850 ml  Net -350 ml   Filed Weights   09/07/24 0430 09/08/24 0500 09/19/24 0255  Weight: 87.1 kg 84.5 kg 85.7 kg    Scheduled Meds:  atorvastatin   80 mg Per Tube QHS   busPIRone   5 mg Oral BID   Chlorhexidine  Gluconate Cloth  6 each Topical  Daily   enoxaparin  (LOVENOX ) injection  40 mg Subcutaneous Q24H   famotidine   20 mg Per Tube BID   feeding supplement (GLUCERNA 1.5 CAL)  237 mL Per Tube TID WC & HS   feeding supplement (PROSource TF20)  60 mL Per Tube Daily   fentaNYL   1 patch Transdermal Q72H   FLUoxetine   40 mg Per Tube Daily   free water   100 mL Per Tube 5 X Daily   gabapentin   200 mg Per Tube BID   insulin  aspart  0-5 Units Subcutaneous QHS   insulin  aspart  0-9 Units Subcutaneous TID WC   insulin  aspart  5 Units Subcutaneous TID WC   insulin  glargine  35 Units Subcutaneous Daily   levETIRAcetam   500 mg Per Tube BID   lidocaine   1 patch  Transdermal Daily   nystatin   100,000 Units Topical TID   mouth rinse  15 mL Mouth Rinse 4 times per day   oxyCODONE   5 mg Per Tube BID   polyethylene glycol  17 g Per Tube Daily   sodium chloride  flush  10-40 mL Intracatheter Q12H   Continuous Infusions:  Nutritional status Signs/Symptoms: percent weight loss, mild muscle depletion, moderate muscle depletion (12% x 6 months) Percent weight loss: 12 % Interventions: Prostat, Tube feeding, MVI Body mass index is 34.57 kg/m.  Data Reviewed:   CBC: Recent Labs  Lab 09/30/24 0500 10/02/24 0515  WBC 10.3 8.5  HGB 11.9* 10.9*  HCT 35.6* 32.8*  MCV 91.8 92.7  PLT 335 273   Basic Metabolic Panel: Recent Labs  Lab 10/02/24 0515  NA 136  K 4.1  CL 96*  CO2 33*  GLUCOSE 120*  BUN 19  CREATININE 0.36*  CALCIUM  9.0   GFR: Estimated Creatinine Clearance: 75 mL/min (A) (by C-G formula based on SCr of 0.36 mg/dL (L)). Liver Function Tests: No results for input(s): AST, ALT, ALKPHOS, BILITOT, PROT, ALBUMIN  in the last 168 hours. No results for input(s): LIPASE, AMYLASE in the last 168 hours. No results for input(s): AMMONIA in the last 168 hours. Coagulation Profile: No results for input(s): INR, PROTIME in the last 168 hours. Cardiac Enzymes: No results for input(s): CKTOTAL, CKMB, CKMBINDEX, TROPONINI in the last 168 hours. BNP (last 3 results) Recent Labs    05/22/24 0630  PROBNP 3,514.0*   HbA1C: No results for input(s): HGBA1C in the last 72 hours. CBG: Recent Labs  Lab 10/03/24 0556 10/03/24 1103 10/03/24 1657 10/03/24 2114 10/04/24 0602  GLUCAP 100* 207* 216* 132* 161*   Lipid Profile: No results for input(s): CHOL, HDL, LDLCALC, TRIG, CHOLHDL, LDLDIRECT in the last 72 hours. Thyroid  Function Tests: No results for input(s): TSH, T4TOTAL, FREET4, T3FREE, THYROIDAB in the last 72 hours. Anemia Panel: No results for input(s): VITAMINB12, FOLATE,  FERRITIN, TIBC, IRON , RETICCTPCT in the last 72 hours. Sepsis Labs: No results for input(s): PROCALCITON, LATICACIDVEN in the last 168 hours.  No results found for this or any previous visit (from the past 240 hours).       Radiology Studies: No results found.         LOS: 79 days   Time spent= 35 mins    Deliliah Room, MD [facility]  If 7PM-7AM, please contact night-coverage  10/04/2024, 7:10 AM  "

## 2024-10-05 DIAGNOSIS — D8989 Other specified disorders involving the immune mechanism, not elsewhere classified: Secondary | ICD-10-CM | POA: Diagnosis not present

## 2024-10-05 DIAGNOSIS — G9341 Metabolic encephalopathy: Secondary | ICD-10-CM | POA: Diagnosis not present

## 2024-10-05 DIAGNOSIS — C7982 Secondary malignant neoplasm of genital organs: Secondary | ICD-10-CM | POA: Diagnosis not present

## 2024-10-05 LAB — GLUCOSE, CAPILLARY
Glucose-Capillary: 173 mg/dL — ABNORMAL HIGH (ref 70–99)
Glucose-Capillary: 317 mg/dL — ABNORMAL HIGH (ref 70–99)
Glucose-Capillary: 241 mg/dL — ABNORMAL HIGH (ref 70–99)
Glucose-Capillary: 130 mg/dL — ABNORMAL HIGH (ref 70–99)

## 2024-10-05 MED ORDER — IMMUNE GLOBULIN (HUMAN) 10 GM/100ML IV SOLN
400.0000 mg/kg | INTRAVENOUS | Status: AC
  Administered 2024-10-05 – 2024-10-09 (×5): 25 g via INTRAVENOUS
  Filled 2024-10-05 (×5): qty 250

## 2024-10-05 NOTE — Progress Notes (Signed)
 " PROGRESS NOTE    Brenda [name]  FMW:[id number] DOB: Mar 11, 1961 DOA: 07/15/2024 PCP: [facility]   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  Transferred from [facility] to home for suspicion of seizures.  Started on Keppra .  Also received empiric antibiotics for concern of meningitis/encephalitis but CSF panel was negative so antibiotics discontinued underwent treatment with high-dose steroids followed by plasmapheresis.  Neurology was following.  Long length of stay.  Attempted to transfer to higher centers, declined.  PT/OT recommending SNF on discharge.LLOS.  TOC following.  Can be discharged to SNF if placement found. Palliative care also consulted for goals of care discussion due to her poor prognosis and quality of life.   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  Neurology was following.  Infectious etiology ruled out, antibiotics discontinued.  Concern for seizures was  started on Keppra .  Also underwent treatment with steroids, plasmapheresis.  Hospital course remarkable for worsening catatonia/stiffness.  MRI showed concerns for evolving infarct.  There was also concern for neurotoxicity secondary to Keytruda  so it was discontinued. -Status post PLEX x 5 days completed Dec 12/28 -Status post high-dose steroids 12/9 - 12/11 -Now on Keppra  500 mg twice daily - Repeat EEG unremarkable Attempted to transfer her to [facility] on 1/16 for second opinion, declined. Initially accepted for transfer to [facility] by Dr. Reta but their neurology team called again on 1/22 discussed the case in detail with Dr Voncile.  Eventually was declined. patient does not need any further neurology workup from their standpoint therefore declined transfer.  No plan for transfer now. -Neurology following.  Steroids taper and IVIG being considered.   Left scalp ring lesion Vesicular rash around left back/axilla VZV infection Concern for tinea corporis, started on terbinafine , plan for 2 weeks of treatment.  Completion of therapy on 1/29 Seen by ID.  HSV PCR negative, VZV positive.  Completed course of  valacyclovir , last day of treatment on 1/25 Continue with airborne precaution until all lesions have scabbed over.   History of endometrial cancer/cancer-related pain: Seen by oncology, urgent care.  There is no clear evidence of progression of metastatic carcinoma as per imagings.  Keytruda  on hold.  Continue pain regimen   History of DVT/PE/splenic infarct: Anticoagulation on hold due to psoas hematoma, GI bleed.  Currently only on DVT prophylaxis.   Right hand/wrist swelling: Improved.  Continue elevation   GI bleed/acute on chronic anemia: Secondary to malignancy and also GI bleed.  Had bright blood red per rectum which has resolved.  CT GI bleed negative.  Required blood transfusion during this hospitalization.  Monitor hemoglobin intermittently, currently stable   History of Klebsiella  UTI: Completed antibiotics course.   Dysphagia/moderate malnutrition: Placed PEG by IR on 1/6.  Continue tube feeding   Diabetes type 2: Recent A1c of 7.2.  Continue current insulin  regimen.  Diabetic coordinator was following   History of depression/anxiety: On BuSpar , Ativan  as needed.  Prozac  discontinued due to rigidity   History of coronary artery disease/stroke/hyperlipidemia: On Lipitor .  Anticoagulation on hold   Abnormal thyroid  function test:Initially low TSH and elevated free T4, was started on propranolol . Repeat levels have normalized    Debility/deconditioning/disposition: PT/OT recommending SNF on discharge.  TOC following.  Difficulty in placement.   Goals of care: Patient remains confused, bedbound, encephalopathic.  Poor quality of life.  On tube feeding.  CODE STATUS DNR/DNI.   She is a candidate for comfort care.  Palliative team on board, appreciate assistance   DVT prophylaxis: enoxaparin  (LOVENOX ) injection 40 mg Start: 09/10/24 1000 Place and maintain sequential compression device Start: 08/02/24 0738     Code Status: Limited: Do not attempt resuscitation (DNR) -DNR-LIMITED -Do Not Intubate/DNI  Family Communication: None at the bedside Status is: Inpatient Remains inpatient appropriate because: Encephalopathy    Subjective: Patient remains encephalopathic.  Yelling constantly.  Denies pain.  Keeps stating I want to be on the pill.  Examination:  General exam: Appears confused, keeps yelling but no distress noted, nasal oxygen  cannula in place Respiratory system: Clear to auscultation. Respiratory effort normal. Cardiovascular system: S1 & S2 heard, RRR. No JVD, murmurs, rubs, gallops or clicks. No pedal edema. Gastrointestinal system: Abdomen is nondistended, PEG tube in place Central nervous system: Alert and oriented. No focal neurological deficits. Extremities: Unable to assess power because of altered mentation Skin: Rash on the  back Genitourinary: Foley catheter in place  Wound 08/11/24 0415 Pressure Injury Vertebral column Medial Stage 2 -  Partial thickness loss of dermis presenting as a shallow open injury with a red, pink wound bed without slough. (Active)     Wound 08/31/24 2100 Pressure Injury Thigh Left;Posterior Stage 1 -  Intact skin with non-blanchable redness of a localized area usually over a bony prominence. (Active)     Diet Orders (From admission, onward)     Start     Ordered   09/27/24 1855  DIET DYS 2 Room service appropriate? Yes; Fluid consistency: Thin  Diet effective now       Question Answer Comment  Room service appropriate? Yes   Fluid consistency: Thin      09/27/24 1855            Objective: Vitals:   10/05/24 0401 10/05/24 0822 10/05/24 1123 10/05/24 1402  BP: (!) 118/42 (!) 152/55 (!) 126/50 (!) 170/93  Pulse: 66 (!) 104 (!) 102 99  Resp: 20 (!) 23 (!) 24 20  Temp: 98.2 F (36.8 C) 98 F (36.7 C) 98 F (36.7 C) 99.8 F (37.7 C)  TempSrc: Axillary Oral Oral   SpO2: 100% 100% 100% 100%  Weight:      Height:        Intake/Output Summary (Last 24 hours) at 10/05/2024 1628 Last data filed at 10/05/2024 1225 Gross per 24 hour  Intake 620 ml  Output 1200 ml  Net -580 ml   Filed Weights   09/07/24 0430 09/08/24 0500 09/19/24 0255  Weight: 87.1 kg 84.5 kg 85.7 kg    Scheduled Meds:  atorvastatin   80 mg Per Tube QHS   busPIRone   5 mg Oral BID   Chlorhexidine  Gluconate Cloth  6 each Topical Daily   enoxaparin  (LOVENOX ) injection  40 mg Subcutaneous Q24H   famotidine   20 mg Per Tube BID   feeding supplement (GLUCERNA 1.5 CAL)  237 mL Per Tube TID WC & HS   feeding supplement (PROSource TF20)  60 mL Per Tube Daily   fentaNYL   1 patch Transdermal Q72H   FLUoxetine   40 mg Per Tube Daily   free water   100 mL Per Tube 5 X Daily   gabapentin   200 mg Per Tube BID   insulin  aspart  0-5 Units Subcutaneous QHS   insulin  aspart  0-9 Units Subcutaneous TID WC   insulin   aspart  5 Units Subcutaneous TID WC   insulin  glargine  35 Units Subcutaneous Daily   levETIRAcetam   500 mg Per Tube BID   lidocaine   1 patch Transdermal Daily   nystatin   100,000 Units Topical TID   mouth rinse  15 mL Mouth Rinse 4 times per day   oxyCODONE   5 mg Per Tube BID   polyethylene glycol  17 g Per Tube Daily   sodium chloride  flush  10-40 mL Intracatheter Q12H   Continuous Infusions:  Nutritional status Signs/Symptoms: percent weight loss, mild muscle depletion, moderate muscle depletion (12% x 6 months) Percent weight loss: 12 % Interventions: Prostat, Tube feeding, MVI Body mass index is 34.57 kg/m.  Data Reviewed:   CBC: Recent Labs  Lab 09/30/24 0500 10/02/24 0515  WBC 10.3 8.5  HGB 11.9* 10.9*  HCT 35.6* 32.8*  MCV 91.8 92.7  PLT 335 273   Basic Metabolic Panel: Recent Labs  Lab 10/02/24 0515  NA 136  K 4.1  CL 96*  CO2 33*  GLUCOSE 120*  BUN 19  CREATININE 0.36*  CALCIUM  9.0   GFR: Estimated Creatinine Clearance: 75 mL/min (A) (by C-G formula based on SCr of 0.36 mg/dL (L)). Liver Function Tests: No results for input(s): AST, ALT, ALKPHOS, BILITOT, PROT, ALBUMIN  in the last 168 hours. No results for input(s): LIPASE, AMYLASE in the last 168 hours. No results for input(s): AMMONIA in the last 168 hours. Coagulation Profile: No results for input(s): INR, PROTIME in the last 168 hours. Cardiac Enzymes: No results for input(s): CKTOTAL, CKMB, CKMBINDEX, TROPONINI in the last 168 hours. BNP (last 3 results) Recent Labs    05/22/24 0630  PROBNP 3,514.0*   HbA1C: No results for input(s): HGBA1C in the last 72 hours. CBG: Recent Labs  Lab 10/04/24 1204 10/04/24 1649 10/04/24 2110 10/05/24 0604 10/05/24 1122  GLUCAP 353* 324* 275* 173* 317*   Lipid Profile: No results for input(s): CHOL, HDL, LDLCALC, TRIG, CHOLHDL, LDLDIRECT in the last 72 hours. Thyroid  Function Tests: No results for  input(s): TSH, T4TOTAL, FREET4, T3FREE, THYROIDAB in the last 72 hours. Anemia Panel: No results for input(s): VITAMINB12, FOLATE, FERRITIN, TIBC, IRON , RETICCTPCT in the last 72 hours. Sepsis Labs: No results for input(s): PROCALCITON, LATICACIDVEN in the last 168 hours.  No results found for this or any previous visit (from the past 240 hours).       Radiology Studies: No results found.         LOS: 80 days   Time spent= 35 mins    MDALA-GAUSI, GOLDEN PILLOW, MD [facility]  If 7PM-7AM, please contact night-coverage  10/05/2024, 4:28 PM  "

## 2024-10-05 NOTE — Plan of Care (Signed)
 Problem: Education: Goal: Ability to describe self-care measures that may prevent or decrease complications (Diabetes Survival Skills Education) will improve Outcome: Not Progressing Goal: Individualized Educational Video(s) Outcome: Not Progressing   Problem: Coping: Goal: Ability to adjust to condition or change in health will improve Outcome: Not Progressing   Problem: Fluid Volume: Goal: Ability to maintain a balanced intake and output will improve Outcome: Not Progressing   Problem: Health Behavior/Discharge Planning: Goal: Ability to identify and utilize available resources and services will improve Outcome: Not Progressing Goal: Ability to manage health-related needs will improve Outcome: Not Progressing   Problem: Metabolic: Goal: Ability to maintain appropriate glucose levels will improve Outcome: Not Progressing   Problem: Nutritional: Goal: Maintenance of adequate nutrition will improve Outcome: Not Progressing Goal: Progress toward achieving an optimal weight will improve Outcome: Not Progressing   Problem: Skin Integrity: Goal: Risk for impaired skin integrity will decrease Outcome: Not Progressing   Problem: Tissue Perfusion: Goal: Adequacy of tissue perfusion will improve Outcome: Not Progressing   Problem: Education: Goal: Knowledge of General Education information will improve Description: Including pain rating scale, medication(s)/side effects and non-pharmacologic comfort measures Outcome: Not Progressing   Problem: Health Behavior/Discharge Planning: Goal: Ability to manage health-related needs will improve Outcome: Not Progressing   Problem: Clinical Measurements: Goal: Ability to maintain clinical measurements within normal limits will improve Outcome: Not Progressing Goal: Will remain free from infection Outcome: Not Progressing Goal: Diagnostic test results will improve Outcome: Not Progressing Goal: Respiratory complications will  improve Outcome: Not Progressing Goal: Cardiovascular complication will be avoided Outcome: Not Progressing   Problem: Activity: Goal: Risk for activity intolerance will decrease Outcome: Not Progressing   Problem: Nutrition: Goal: Adequate nutrition will be maintained Outcome: Not Progressing   Problem: Coping: Goal: Level of anxiety will decrease Outcome: Not Progressing   Problem: Elimination: Goal: Will not experience complications related to bowel motility Outcome: Not Progressing Goal: Will not experience complications related to urinary retention Outcome: Not Progressing   Problem: Pain Managment: Goal: General experience of comfort will improve and/or be controlled Outcome: Not Progressing   Problem: Safety: Goal: Ability to remain free from injury will improve Outcome: Not Progressing   Problem: Skin Integrity: Goal: Risk for impaired skin integrity will decrease Outcome: Not Progressing   Problem: Education: Goal: Knowledge of General Education information will improve Description: Including pain rating scale, medication(s)/side effects and non-pharmacologic comfort measures Outcome: Not Progressing   Problem: Health Behavior/Discharge Planning: Goal: Ability to manage health-related needs will improve Outcome: Not Progressing   Problem: Clinical Measurements: Goal: Ability to maintain clinical measurements within normal limits will improve Outcome: Not Progressing Goal: Will remain free from infection Outcome: Not Progressing Goal: Diagnostic test results will improve Outcome: Not Progressing Goal: Respiratory complications will improve Outcome: Not Progressing Goal: Cardiovascular complication will be avoided Outcome: Not Progressing   Problem: Activity: Goal: Risk for activity intolerance will decrease Outcome: Not Progressing   Problem: Nutrition: Goal: Adequate nutrition will be maintained Outcome: Not Progressing   Problem:  Coping: Goal: Level of anxiety will decrease Outcome: Not Progressing   Problem: Elimination: Goal: Will not experience complications related to bowel motility Outcome: Not Progressing Goal: Will not experience complications related to urinary retention Outcome: Not Progressing   Problem: Pain Managment: Goal: General experience of comfort will improve and/or be controlled Outcome: Not Progressing   Problem: Safety: Goal: Ability to remain free from injury will improve Outcome: Not Progressing   Problem: Skin Integrity: Goal: Risk for impaired skin integrity  will decrease Outcome: Not Progressing

## 2024-10-06 DIAGNOSIS — D8989 Other specified disorders involving the immune mechanism, not elsewhere classified: Secondary | ICD-10-CM | POA: Diagnosis not present

## 2024-10-06 DIAGNOSIS — C7982 Secondary malignant neoplasm of genital organs: Secondary | ICD-10-CM | POA: Diagnosis not present

## 2024-10-06 DIAGNOSIS — G9341 Metabolic encephalopathy: Secondary | ICD-10-CM | POA: Diagnosis not present

## 2024-10-06 LAB — BASIC METABOLIC PANEL WITH GFR
Anion gap: 4 — ABNORMAL LOW (ref 5–15)
BUN: 23 mg/dL (ref 8–23)
CO2: 35 mmol/L — ABNORMAL HIGH (ref 22–32)
Calcium: 8.9 mg/dL (ref 8.9–10.3)
Chloride: 97 mmol/L — ABNORMAL LOW (ref 98–111)
Creatinine, Ser: 0.38 mg/dL — ABNORMAL LOW (ref 0.44–1.00)
GFR, Estimated: >60 mL/min
Glucose, Bld: 161 mg/dL — ABNORMAL HIGH (ref 70–99)
Potassium: 4.5 mmol/L (ref 3.5–5.1)
Sodium: 136 mmol/L (ref 135–145)

## 2024-10-06 LAB — GLUCOSE, CAPILLARY
Glucose-Capillary: 118 mg/dL — ABNORMAL HIGH (ref 70–99)
Glucose-Capillary: 220 mg/dL — ABNORMAL HIGH (ref 70–99)
Glucose-Capillary: 305 mg/dL — ABNORMAL HIGH (ref 70–99)
Glucose-Capillary: 185 mg/dL — ABNORMAL HIGH (ref 70–99)

## 2024-10-06 NOTE — Plan of Care (Signed)
  Problem: Skin Integrity: Goal: Risk for impaired skin integrity will decrease Outcome: Progressing   Problem: Tissue Perfusion: Goal: Adequacy of tissue perfusion will improve Outcome: Progressing   Problem: Nutrition: Goal: Adequate nutrition will be maintained Outcome: Progressing

## 2024-10-06 NOTE — Plan of Care (Signed)
   Problem: Safety: Goal: Ability to remain free from injury will improve Outcome: Progressing

## 2024-10-06 NOTE — Progress Notes (Signed)
 Nutrition Follow-up  DOCUMENTATION CODES:   Non-severe (moderate) malnutrition in context of chronic illness  INTERVENTION:  Continue bolus TF via PEG: Glucerna 1.5 bolus feeds, 4 cartons per day (948 ml per day)  Prosource TF20 1x/d Free water : 100 mL 5x/d (50mL before and after each bolus + 1 additional fluid bolus) Provides 1504 kcal, 98 gm protein, 820 ml free water  daily ( fee water  TF+flush)   Obtain updated weekly weights  NUTRITION DIAGNOSIS:   Moderate Malnutrition related to chronic illness (cancer) as evidenced by percent weight loss, mild muscle depletion, moderate muscle depletion (12% x 6 months). - Ongoing   GOAL:   Patient will meet greater than or equal to 90% of their needs - Meeting via TF's  MONITOR:   Weight trends, TF tolerance, I & O's, Labs, Diet advancement  REASON FOR ASSESSMENT:   Consult Enteral/tube feeding initiation and management  ASSESSMENT:   Pt with hx of HTN, HLD, CAD, CHF, hx CVA, uterine cancer s/p hysterectomy and adjuvant radiation therapy in 2023 with recurrance with mets in July 2025, hx MI x 3, and DM type 1 presented to [facility] with weakness after a recent admission from 11/9-11/11. Pt had seizures during admission and was transferred to [facility] for continuous EEG.   1/25: diet downgraded to dysphagia 2 per nursing staff   Patient has been declined for hospital transfer. Plan for dispo to SNF. CSW working on placement.    Per RN did not eat breakfast today. Yesterday pt did surpisingly well with intake and had 50-60% of her meals per meals documented. Pt yelling sisters name Arland on visit. Per RN, tolerating bolus feeds. Does not like her head of bed to be raised. PEG tube feeds continue to be primary source of nutritional intake.   Admit weight: 77.9 kg Current weight: 85.7 kg (last updated 1/17)  Average Meal Intake: 1/18-2/1: 24% intake x 8 recorded meals  Nutritionally Relevant Medications: Scheduled  Meds:  famotidine   20 mg Per Tube BID   feeding supplement (GLUCERNA 1.5 CAL)  237 mL Per Tube TID WC & HS   feeding supplement (PROSource TF20)  60 mL Per Tube Daily   fentaNYL   1 patch Transdermal Q72H   FLUoxetine   40 mg Per Tube Daily   free water   100 mL Per Tube 5 X Daily   gabapentin   200 mg Per Tube BID   insulin  aspart  0-5 Units Subcutaneous QHS   insulin  aspart  0-9 Units Subcutaneous TID WC   insulin  aspart  5 Units Subcutaneous TID WC   insulin  glargine  35 Units Subcutaneous Daily   Continuous Infusions:  Immune Globulin  10% 38.6 mL/hr at 10/05/24 1803   Labs Reviewed: CBG ranges from 118-317 mg/dL over the last 24 hours HgbA1c 7.2  Diet Order:   Diet Order             DIET DYS 2 Room service appropriate? Yes; Fluid consistency: Thin  Diet effective now                   EDUCATION NEEDS:   Education needs have been addressed  Skin:  Skin Assessment: Reviewed RN Assessment  Last BM:  1/30  Height:   Ht Readings from Last 1 Encounters:  08/21/24 5' 2 (1.575 m)    Weight:   Wt Readings from Last 1 Encounters:  09/19/24 85.7 kg    Ideal Body Weight:  50 kg  BMI:  Body mass index is  34.57 kg/m.  Estimated Nutritional Needs:   Kcal:  1600-1800 kcal/d  Protein:  75-90 g/d  Fluid:  >/=1.6L/d  [name], RD Registered Dietitian  See Amion for more information

## 2024-10-07 DIAGNOSIS — G9341 Metabolic encephalopathy: Secondary | ICD-10-CM | POA: Diagnosis not present

## 2024-10-07 DIAGNOSIS — D8989 Other specified disorders involving the immune mechanism, not elsewhere classified: Secondary | ICD-10-CM | POA: Diagnosis not present

## 2024-10-07 DIAGNOSIS — C7982 Secondary malignant neoplasm of genital organs: Secondary | ICD-10-CM | POA: Diagnosis not present

## 2024-10-07 LAB — CBC
HCT: 29.1 % — ABNORMAL LOW (ref 36.0–46.0)
Hemoglobin: 9.2 g/dL — ABNORMAL LOW (ref 12.0–15.0)
MCH: 30.7 pg (ref 26.0–34.0)
MCHC: 31.6 g/dL (ref 30.0–36.0)
MCV: 97 fL (ref 80.0–100.0)
Platelets: 205 10*3/uL (ref 150–400)
RBC: 3 MIL/uL — ABNORMAL LOW (ref 3.87–5.11)
RDW: 14.3 % (ref 11.5–15.5)
WBC: 5.9 10*3/uL (ref 4.0–10.5)
nRBC: 0 % (ref 0.0–0.2)

## 2024-10-07 LAB — GLUCOSE, CAPILLARY
Glucose-Capillary: 167 mg/dL — ABNORMAL HIGH (ref 70–99)
Glucose-Capillary: 244 mg/dL — ABNORMAL HIGH (ref 70–99)
Glucose-Capillary: 249 mg/dL — ABNORMAL HIGH (ref 70–99)
Glucose-Capillary: 199 mg/dL — ABNORMAL HIGH (ref 70–99)

## 2024-10-07 MED ORDER — PREDNISONE 20 MG PO TABS
50.0000 mg | ORAL_TABLET | Freq: Every day | ORAL | Status: AC
  Administered 2024-10-08 – 2024-10-09 (×2): 50 mg via ORAL
  Filled 2024-10-07 (×2): qty 1

## 2024-10-07 NOTE — Progress Notes (Signed)
 " PROGRESS NOTE    Brenda [name]  FMW:[id number] DOB: 1960/09/29 DOA: 07/15/2024 PCP: [facility]   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  Transferred from [facility] to home for suspicion of seizures.  Started on Keppra .  Also received empiric antibiotics for concern of meningitis/encephalitis but CSF panel was negative so antibiotics discontinued underwent treatment with high-dose steroids followed by plasmapheresis.  Neurology was following.  Long length of stay.  Attempted to transfer to higher centers, declined.  PT/OT recommending SNF on discharge.LLOS.  TOC following.  Can be discharged to SNF if placement found. Palliative care also consulted for goals of care discussion due to her poor prognosis and quality of life.   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  Neurology was following.  Infectious etiology ruled out, antibiotics discontinued.  Concern for seizures was  started on Keppra .  Also underwent treatment with steroids, plasmapheresis.  Hospital course remarkable for worsening catatonia/stiffness.  MRI showed concerns for evolving infarct.  There was also concern for neurotoxicity secondary to Keytruda  so it was discontinued. -Status post PLEX x 5 days completed Dec 12/28 -Status post high-dose steroids 12/9 - 12/11 -Now on Keppra  500 mg twice daily - Repeat EEG unremarkable Attempted to transfer her to [facility] on 1/16 for second opinion, declined. Initially accepted for transfer to [facility] by Dr. Reta but their neurology team called again on 1/22 discussed the case in detail with Dr Voncile.  Eventually was declined. patient does not need any further neurology workup from their standpoint therefore declined transfer.  No plan for transfer now. -Neurology following.  Started on IVIG on 2/3.   Left scalp ring lesion Vesicular rash around left back/axilla VZV infection Concern for tinea corporis, started on terbinafine , plan for 2 weeks of treatment.  Completion of therapy on 1/29 Seen by ID.  HSV PCR negative, VZV positive.  Completed course of  valacyclovir , last day of treatment on 1/25 Continue with airborne precaution until all lesions have scabbed over.   History of endometrial cancer/cancer-related pain: Seen by oncology, urgent care.  There is no clear evidence of progression of metastatic carcinoma as per imagings.  Keytruda  on hold.  Continue pain regimen   History of DVT/PE/splenic infarct: Anticoagulation on hold due to psoas hematoma, GI bleed.  Currently only on DVT prophylaxis.   Right hand/wrist swelling: Improved.  Continue elevation   GI bleed/acute on chronic anemia: Secondary to malignancy and also GI bleed.  Had bright blood red per rectum which has resolved.  CT GI bleed negative.  Required blood transfusion during this hospitalization.  Monitor hemoglobin intermittently, currently stable   History of Klebsiella UTI: Completed  antibiotics course.   Dysphagia/moderate malnutrition: Placed PEG by IR on 1/6.  Continue tube feeding   Diabetes type 2: Recent A1c of 7.2.  Continue current insulin  regimen.  Diabetic coordinator was following   History of depression/anxiety: On BuSpar , Ativan  as needed.  Prozac  discontinued due to rigidity   History of coronary artery disease/stroke/hyperlipidemia: On Lipitor .  Anticoagulation on hold   Abnormal thyroid  function test:Initially low TSH and elevated free T4, was started on propranolol . Repeat levels have normalized    Debility/deconditioning/disposition: PT/OT recommending SNF on discharge.  TOC following.  Difficulty in placement.   Goals of care: Patient remains confused, bedbound, encephalopathic.  Poor quality of life.  On tube feeding.  CODE STATUS DNR/DNI.   She is a candidate for comfort care.  Palliative team on board, appreciate assistance. - Will need to discuss the way forward with the patient's husband when he is available for discussion. Per previous reports he recently had surgery and is still recovering from that.    DVT prophylaxis: enoxaparin  (LOVENOX ) injection 40 mg Start: 09/10/24 1000 Place and maintain sequential compression device Start: 08/02/24 0738     Code Status: Limited: Do not attempt resuscitation (DNR) -DNR-LIMITED -Do Not Intubate/DNI  Family Communication: None at the bedside Status is: Inpatient Remains inpatient appropriate because: Encephalopathy    Subjective: Yelling again today  Examination:  General exam: Remains confused. Hirsutism. Yelling + Respiratory system: Clear to auscultation. Respiratory effort normal. Cardiovascular system: S1 & S2 heard, RRR. No JVD, murmurs, rubs, gallops or clicks. No pedal edema. Gastrointestinal system: Abdomen is nondistended, PEG tube in place Central nervous system: Alert and oriented. No focal neurological deficits. Extremities: Unable to assess power because of altered  mentation Skin: Rash on the back Genitourinary: Foley catheter in place  Wound 08/11/24 0415 Pressure Injury Vertebral column Medial Stage 2 -  Partial thickness loss of dermis presenting as a shallow open injury with a red, pink wound bed without slough. (Active)     Wound 08/31/24 2100 Pressure Injury Thigh Left;Posterior Stage 1 -  Intact skin with non-blanchable redness of a localized area usually over a bony prominence. (Active)     Diet Orders (From admission, onward)     Start     Ordered   09/27/24 1855  DIET DYS 2 Room service appropriate? Yes; Fluid consistency: Thin  Diet effective now       Question Answer Comment  Room service appropriate? Yes   Fluid consistency: Thin      09/27/24 1855            Objective: Vitals:   10/06/24 1939 10/06/24 2100 10/07/24 0520 10/07/24 0700  BP: (!) 145/76 (!) 133/93 116/65 123/68  Pulse: 85 82 70 (!) 58  Resp: 20 18 20 18   Temp: 98.6 F (37 C) 98.2 F (36.8 C) 98.4 F (36.9 C) 98.6 F (37 C)  TempSrc: Axillary Axillary    SpO2: 100% 100% 100% 100%  Weight:      Height:        Intake/Output Summary (Last 24 hours) at 10/07/2024 1321 Last data filed at 10/07/2024 1152 Gross per 24 hour  Intake 510 ml  Output --  Net 510 ml   Filed Weights   09/07/24 0430 09/08/24 0500 09/19/24 0255  Weight: 87.1 kg 84.5 kg 85.7 kg    Scheduled Meds:  atorvastatin   80 mg Per Tube QHS   busPIRone   5 mg Oral BID   Chlorhexidine  Gluconate Cloth  6 each Topical Daily   enoxaparin  (LOVENOX ) injection  40 mg Subcutaneous Q24H   famotidine   20 mg Per Tube BID   feeding supplement (GLUCERNA 1.5 CAL)  237 mL Per Tube TID WC & HS   feeding supplement (PROSource TF20)  60 mL Per Tube Daily   fentaNYL   1 patch Transdermal Q72H   FLUoxetine   40 mg Per Tube Daily   free water   100 mL Per Tube 5 X Daily   gabapentin   200 mg Per Tube BID   insulin  aspart  0-5 Units Subcutaneous QHS   insulin  aspart  0-9 Units Subcutaneous TID WC   insulin   aspart  5 Units Subcutaneous TID WC   insulin  glargine  35 Units Subcutaneous Daily   levETIRAcetam   500 mg Per Tube BID   lidocaine   1 patch Transdermal Daily   nystatin   100,000 Units Topical TID   mouth rinse  15 mL Mouth Rinse 4 times per day   oxyCODONE   5 mg Per Tube BID   polyethylene glycol  17 g Per Tube Daily   sodium chloride  flush  10-40 mL Intracatheter Q12H   Continuous Infusions:  Immune Globulin  10% 154 mL/hr at 10/06/24 1824    Nutritional status Signs/Symptoms: percent weight loss, mild muscle depletion, moderate muscle depletion (12% x 6 months) Percent weight loss: 12 % Interventions: Prostat, Tube feeding, MVI Body mass index is 34.57 kg/m.  Data Reviewed:   CBC: Recent Labs  Lab 10/02/24 0515 10/07/24 0253  WBC 8.5 5.9  HGB 10.9* 9.2*  HCT 32.8* 29.1*  MCV 92.7 97.0  PLT 273 205   Basic Metabolic Panel: Recent Labs  Lab 10/02/24 0515 10/06/24 0009  NA 136 136  K 4.1 4.5  CL 96* 97*  CO2 33* 35*  GLUCOSE 120* 161*  BUN 19 23  CREATININE 0.36* 0.38*  CALCIUM  9.0 8.9   GFR: Estimated Creatinine Clearance: 75 mL/min (A) (by C-G formula based on SCr of 0.38 mg/dL (L)). Liver Function Tests: No results for input(s): AST, ALT, ALKPHOS, BILITOT, PROT, ALBUMIN  in the last 168 hours. No results for input(s): LIPASE, AMYLASE in the last 168 hours. No results for input(s): AMMONIA in the last 168 hours. Coagulation Profile: No results for input(s): INR, PROTIME in the last 168 hours. Cardiac Enzymes: No results for input(s): CKTOTAL, CKMB, CKMBINDEX, TROPONINI in the last 168 hours. BNP (last 3 results) Recent Labs    05/22/24 0630  PROBNP 3,514.0*   HbA1C: No results for input(s): HGBA1C in the last 72 hours. CBG: Recent Labs  Lab 10/06/24 1137 10/06/24 1608 10/06/24 2138 10/07/24 0602 10/07/24 1123  GLUCAP 220* 305* 185* 167* 244*   Lipid Profile: No results for input(s): CHOL, HDL, LDLCALC,  TRIG, CHOLHDL, LDLDIRECT in the last 72 hours. Thyroid  Function Tests: No results for input(s): TSH, T4TOTAL, FREET4, T3FREE, THYROIDAB in the last 72 hours. Anemia Panel: No results for input(s): VITAMINB12, FOLATE, FERRITIN, TIBC, IRON , RETICCTPCT in the last 72 hours. Sepsis Labs: No results for input(s): PROCALCITON, LATICACIDVEN in the last 168 hours.  No results found for this or any previous visit (from the past 240 hours).    Radiology Studies: No results found.    LOS: 82 days    MDALA-GAUSI, GOLDEN PILLOW, MD [facility]  If 7PM-7AM, please contact night-coverage  10/07/2024, 1:21 PM  "

## 2024-10-07 NOTE — Plan of Care (Signed)
" °  Problem: Fluid Volume: Goal: Ability to maintain a balanced intake and output will improve Outcome: Progressing   Problem: Metabolic: Goal: Ability to maintain appropriate glucose levels will improve Outcome: Progressing   Problem: Nutritional: Goal: Maintenance of adequate nutrition will improve Outcome: Progressing Goal: Progress toward achieving an optimal weight will improve Outcome: Progressing   Problem: Skin Integrity: Goal: Risk for impaired skin integrity will decrease Outcome: Progressing   Problem: Tissue Perfusion: Goal: Adequacy of tissue perfusion will improve Outcome: Progressing   Problem: Clinical Measurements: Goal: Will remain free from infection Outcome: Progressing Goal: Respiratory complications will improve Outcome: Progressing Goal: Cardiovascular complication will be avoided Outcome: Progressing   Problem: Nutrition: Goal: Adequate nutrition will be maintained Outcome: Progressing   Problem: Elimination: Goal: Will not experience complications related to bowel motility Outcome: Progressing Goal: Will not experience complications related to urinary retention Outcome: Progressing   Problem: Safety: Goal: Ability to remain free from injury will improve Outcome: Progressing   Problem: Skin Integrity: Goal: Risk for impaired skin integrity will decrease Outcome: Progressing   Problem: Clinical Measurements: Goal: Will remain free from infection Outcome: Progressing Goal: Respiratory complications will improve Outcome: Progressing Goal: Cardiovascular complication will be avoided Outcome: Progressing   Problem: Nutrition: Goal: Adequate nutrition will be maintained Outcome: Progressing   Problem: Elimination: Goal: Will not experience complications related to bowel motility Outcome: Progressing Goal: Will not experience complications related to urinary retention Outcome: Progressing   Problem: Safety: Goal: Ability to remain free  from injury will improve Outcome: Progressing   Problem: Skin Integrity: Goal: Risk for impaired skin integrity will decrease Outcome: Progressing   Problem: Education: Goal: Ability to describe self-care measures that may prevent or decrease complications (Diabetes Survival Skills Education) will improve Outcome: Not Progressing   Problem: Coping: Goal: Ability to adjust to condition or change in health will improve Outcome: Not Progressing   Problem: Health Behavior/Discharge Planning: Goal: Ability to identify and utilize available resources and services will improve Outcome: Not Progressing Goal: Ability to manage health-related needs will improve Outcome: Not Progressing   Problem: Education: Goal: Knowledge of General Education information will improve Description: Including pain rating scale, medication(s)/side effects and non-pharmacologic comfort measures Outcome: Not Progressing   Problem: Health Behavior/Discharge Planning: Goal: Ability to manage health-related needs will improve Outcome: Not Progressing   Problem: Activity: Goal: Risk for activity intolerance will decrease Outcome: Not Progressing   Problem: Coping: Goal: Level of anxiety will decrease Outcome: Not Progressing   Problem: Pain Managment: Goal: General experience of comfort will improve and/or be controlled Outcome: Not Progressing   Problem: Education: Goal: Knowledge of General Education information will improve Description: Including pain rating scale, medication(s)/side effects and non-pharmacologic comfort measures Outcome: Not Progressing   Problem: Health Behavior/Discharge Planning: Goal: Ability to manage health-related needs will improve Outcome: Not Progressing   Problem: Activity: Goal: Risk for activity intolerance will decrease Outcome: Not Progressing   Problem: Coping: Goal: Level of anxiety will decrease Outcome: Not Progressing   Problem: Pain Managment: Goal:  General experience of comfort will improve and/or be controlled Outcome: Not Progressing   "

## 2024-10-07 NOTE — Progress Notes (Addendum)
 PEG tube gauze changed. Small round, clear device with small antenna-like feature found underneath PEG tube external fixation. Management made aware.   Device not listed in LDA.  MD notified - device was PEG tube suture button. Per Donato, MD; will notify IR.

## 2024-10-07 NOTE — TOC Progression Note (Signed)
 Transition of Care [facility]) - Progression Note    Patient Details  Name: Brenda [name] MRN: [id number] Date of Birth: [birth year]  Transition of Care [facility]) CM/SW Contact  Bridget Cordella Simmonds, LCSW Phone Number: 10/07/2024, 11:35 AM  Clinical Narrative:   Pt transferred to [facility] on 10/05/24.  CSW notes ongoing palliative discussion for GOC.  PT has signed off at this time.  ICM will continue to follow.    Expected Discharge Plan: Skilled Nursing Facility Barriers to Discharge: No SNF bed, Inadequate or no insurance               Expected Discharge Plan and Services                                               Social Drivers of Health (SDOH) Interventions SDOH Screenings   Food Insecurity: No Food Insecurity (07/16/2024)  Recent Concern: Food Insecurity - Food Insecurity Present (04/20/2024)  Housing: Low Risk (07/16/2024)  Transportation Needs: No Transportation Needs (07/16/2024)  Utilities: Not At Risk (07/16/2024)  Depression (PHQ2-9): Low Risk (06/30/2024)  Recent Concern: Depression (PHQ2-9) - Medium Risk (05/07/2024)  Social Connections: Moderately Integrated (07/13/2024)  Tobacco Use: Medium Risk (09/07/2024)    Readmission Risk Interventions    07/14/2024    2:34 PM 05/04/2024   10:00 AM  Readmission Risk Prevention Plan  Transportation Screening Complete Complete  PCP or Specialist Appt within 3-5 Days  Complete  HRI or Home Care Consult  Complete  Social Work Consult for Recovery Care Planning/Counseling  Complete  Palliative Care Screening  Not Applicable  Medication Review Oceanographer) Complete Complete  PCP or Specialist appointment within 3-5 days of discharge Complete   HRI or Home Care Consult Complete   SW Recovery Care/Counseling Consult Complete   Palliative Care Screening Not Applicable   Skilled Nursing Facility Not Applicable

## 2024-10-07 NOTE — Progress Notes (Signed)
 "   Referring Physician(s): Mdala-Gausi, Golden Pillow, MD  Supervising Physician: Jennefer Rover  Patient Status:  [facility] - In-pt  Chief Complaint:  IR percutaneous gastric tube was placed 09/08/24 Request made to evaluate G tube site regarding small button suture that has come dislodged  Subjective:  Gastropexy sutures were placed during procedure 09/08/24 Discussed with Dr Jennefer These suture normally fall out on own as expected If still intact can be removed  I have seen and examined pt No sutures remain    Allergies: Hydrocodone and Clopidogrel  Medications: Prior to Admission medications  Medication Sig Start Date End Date Taking? Authorizing Provider  acetaminophen  (TYLENOL ) 500 MG tablet Take 1-2 tablets (500-1,000 mg total) by mouth every 8 (eight) hours as needed for mild pain (pain score 1-3) or moderate pain (pain score 4-6). 03/30/24 03/30/25 Yes Hongalgi, Anand D, MD  atorvastatin  (LIPITOR ) 80 MG tablet Take 1 tablet (80 mg total) by mouth every evening. 06/11/24  Yes Jerilynn Daphne SAILOR, NP  Cholecalciferol  (VITAMIN D3 SUPER STRENGTH) 50 MCG (2000 UT) CAPS Take 2,000 Units by mouth in the morning.   Yes [provider]  enoxaparin  (LOVENOX ) 80 MG/0.8ML injection Inject 0.8 mLs (80 mg total) into the skin every 12 (twelve) hours. 06/22/24 07/22/24 Yes Elgergawy, Brayton RAMAN, MD  FLUoxetine  (PROZAC ) 40 MG capsule Take 1 capsule (40 mg total) by mouth daily. 06/11/24  Yes Jerilynn Daphne SAILOR, NP  guaiFENesin  (MUCINEX ) 600 MG 12 hr tablet Take 1 tablet (600 mg total) by mouth 2 (two) times daily as needed for cough or to loosen phlegm. 05/05/24  Yes Caleen Qualia, MD  haloperidol  (HALDOL ) 1 MG tablet Take 2 tablets (2 mg total) by mouth 2 (two) times daily. 06/22/24  Yes Elgergawy, Brayton RAMAN, MD  hydrOXYzine  (ATARAX ) 10 MG tablet Take 1 tablet (10 mg total) by mouth 3 (three) times daily as needed for anxiety. 06/22/24  Yes Elgergawy, Brayton RAMAN, MD  insulin  aspart (FIASP ) 100  UNIT/ML FlexTouch Pen Inject 1-3 Units into the skin 3 (three) times daily with meals. Per sliding scale   Yes [provider]  insulin  degludec (TRESIBA ) 100 UNIT/ML FlexTouch Pen Inject 25 Units into the skin daily. Patient taking differently: Inject 18 Units into the skin daily. 06/29/24  Yes Gorsuch, Ni, MD  lactulose  (CHRONULAC ) 10 GM/15ML solution Take 45 mLs (30 g total) by mouth 2 (two) times daily as needed for mild constipation or moderate constipation. 05/05/24  Yes Caleen Qualia, MD  lidocaine  (LIDODERM ) 5 % Place 1 patch onto the skin daily. Placed on back below tumor 04/27/24  Yes [provider]  metFORMIN  (GLUCOPHAGE -XR) 500 MG 24 hr tablet Take 2 tablets (1,000 mg total) by mouth 2 (two) times daily with a meal. 06/11/24  Yes Jerilynn Daphne SAILOR, NP  mirtazapine  (REMERON ) 15 MG tablet Take 1 tablet (15 mg total) by mouth at bedtime. 06/22/24  Yes Elgergawy, Brayton RAMAN, MD  Misc Natural Products (NEURIVA PO) Take 1 tablet by mouth daily.   Yes [provider]  nitroGLYCERIN  (NITROSTAT ) 0.4 MG SL tablet Dissolve 1 tablet under the tongue every 5 minutes as needed for chest pain. Max of 3 doses, then 911. 01/25/23  Yes Weaver, Scott T, PA-C  nystatin  cream (MYCOSTATIN ) Apply topically 2 (two) times daily. Patient taking differently: Apply topically 2 (two) times daily as needed for dry skin (yeast). 06/11/24  Yes Jerilynn Daphne SAILOR, NP  polyethylene glycol powder (GLYCOLAX /MIRALAX ) 17 GM/SCOOP powder Dissolve 1 capful (17g) in 4-8 ounces  of liquid and take by mouth daily as needed for mild constipation. 06/11/24  Yes Jerilynn Daphne SAILOR, NP  pregabalin  (LYRICA ) 50 MG capsule Take 1 capsule (50 mg total) by mouth 3 (three) times daily. 06/11/24  Yes Jerilynn Daphne SAILOR, NP  Misc. Devices MISC Portable oxygen  concentrator, 2L oxygen .  Diagnoses-chronic respiratory failure with hypoxia 11/27/21   Newlin, Enobong, MD  Oxycodone  HCl 10 MG TABS Take 1 tablet (10 mg total) by mouth  every 6 (six) hours as needed (pain). 06/29/24   Lonn Hicks, MD     Vital Signs: BP 123/68 (BP Location: Left Wrist)   Pulse (!) 58   Temp 98.6 F (37 C)   Resp 18   Ht 5' 2 (1.575 m)   Wt 189 lb (85.7 kg)   LMP 10/30/2014   SpO2 100%   BMI 34.57 kg/m   Physical Exam Abdominal:     Palpations: Abdomen is soft.  Skin:    General: Skin is warm and dry.     Comments: Site of G tube is clean and dry NO bleeding No sign of infection No sutures remain  No procedure needed today     Imaging: No results found.  Labs:  CBC: Recent Labs    09/26/24 0500 09/30/24 0500 10/02/24 0515 10/07/24 0253  WBC 7.6 10.3 8.5 5.9  HGB 10.9* 11.9* 10.9* 9.2*  HCT 32.8* 35.6* 32.8* 29.1*  PLT 298 335 273 205    COAGS: Recent Labs    06/20/24 0115 06/20/24 1249 06/20/24 2232 06/21/24 0430 07/12/24 2313 07/16/24 0200 08/18/24 1223 09/07/24 0420  INR  --   --   --   --  1.0 1.0 1.0 1.0  APTT 55* 65* 68* 67*  --   --   --   --     BMP: Recent Labs    09/23/24 0443 09/26/24 0500 10/02/24 0515 10/06/24 0009  NA 133* 133* 136 136  K 4.2 4.3 4.1 4.5  CL 95* 95* 96* 97*  CO2 31 31 33* 35*  GLUCOSE 427* 302* 120* 161*  BUN 17 13 19 23   CALCIUM  8.9 9.3 9.0 8.9  CREATININE 0.42* 0.38* 0.36* 0.38*  GFRNONAA >60 >60 >60 >60    LIVER FUNCTION TESTS: Recent Labs    08/23/24 0350 08/30/24 2105 09/07/24 0420 09/09/24 0600  BILITOT 0.3 0.4 0.2 0.4  AST 41 19 13* 15  ALT 72* 25 16 14   ALKPHOS 83 46 77 91  PROT 4.5* 4.4* 5.0* 5.6*  ALBUMIN  3.2* 3.4* 3.2* 3.4*    Assessment and Plan:  Sutures placed during placement of G tube in IR 09/08/24 Have come out on own as expected. No sign of infection All is well  Please do not place 4x4s beneath bumper of G tube. Bumper needs to be as close to skin as possible-- unless you feel area is bleeding or infected-- then use only one 4x4 if can please.   Electronically Signed: Sharlet DELENA Candle, PA-C 10/07/2024, 1:07  PM   I spent a total of 15 Minutes at the the patient's bedside AND on the patient's hospital floor or unit, greater than 50% of which was counseling/coordinating care for G tube sutures     "

## 2024-10-07 NOTE — Progress Notes (Signed)
 NEUROLOGY CONSULT FOLLOW UP NOTE   Date of service: October 07, 2024 Patient Name: Brenda [name] MRN:  [id number] DOB:  [birth year]  Interval Hx/subjective   She continues to cry out, perseverates, but may be slightly clearer than yesterday.  Vitals   Vitals:   10/07/24 1754 10/07/24 1809 10/07/24 1824 10/07/24 1923  BP: (!) 157/77 (!) 166/73 (!) 158/69 (!) 166/84  Pulse: 93 92 91 92  Resp: 20 20 18 20   Temp: 99.2 F (37.3 C) 98.5 F (36.9 C) 99 F (37.2 C) 98.6 F (37 C)  TempSrc: Axillary Axillary Axillary Axillary  SpO2: 100% 100% 100% 100%  Weight:      Height:         Physical Exam    Neuro: Mental Status: She is awake, perseverates on the word okay, and when I ask her how she is doing she states I am good but then when I ask her where she is she answers I am good Cranial Nerves: Pupils are unequal, left is larger and less reactive, chronic.  She has difficulty going fully to either direction, but she is able to cross midline in both directions with extraocular movements, face is symmetric. Motor: She has increased tone, and actively resists any type of movement, she does not cooperate with any type of formal strength testing. Sensation: She says ouch with noxious stimulation bilaterally    Medications  Current Facility-Administered Medications:    acetaminophen  (TYLENOL ) tablet 650 mg, 650 mg, Oral, Q6H PRN, Amin, Ankit C, MD, 650 mg at 09/24/24 1154   alum & mag hydroxide-simeth (MAALOX/MYLANTA) 200-200-20 MG/5ML suspension 30 mL, 30 mL, Per Tube, Q6H PRN, Amin, Ankit C, MD, 30 mL at 09/17/24 1104   artificial tears (LACRILUBE) ophthalmic ointment, , Both Eyes, Q4H PRN, Tariq, Hassan, MD, Given at 09/01/24 1211   atorvastatin  (LIPITOR ) tablet 80 mg, 80 mg, Per Tube, QHS, Sigdel, Santosh, MD, 80 mg at 10/06/24 2154   busPIRone  (BUSPAR ) tablet 5 mg, 5 mg, Oral, BID, Drusilla, Sabas RAMAN, MD, 5 mg at 10/07/24 9051   Chlorhexidine  Gluconate Cloth 2 % PADS 6  each, 6 each, Topical, Daily, Chavez, Abigail, NP, 6 each at 10/07/24 0948   enoxaparin  (LOVENOX ) injection 40 mg, 40 mg, Subcutaneous, Q24H, Drusilla, Sabas RAMAN, MD, 40 mg at 10/07/24 9052   famotidine  (PEPCID ) tablet 20 mg, 20 mg, Per Tube, BID, Amin, Ankit C, MD, 20 mg at 10/07/24 0948   feeding supplement (GLUCERNA 1.5 CAL) liquid 237 mL, 237 mL, Per Tube, TID WC & HS, Gretel Prentice BIRCH, RPH, 237 mL at 10/07/24 1728   feeding supplement (PROSource TF20) liquid 60 mL, 60 mL, Per Tube, Daily, Von Bellis, MD, 60 mL at 10/07/24 0946   fentaNYL  (DURAGESIC ) 25 MCG/HR 1 patch, 1 patch, Transdermal, Q72H, Drusilla, Sabas RAMAN, MD, 1 patch at 10/06/24 2249   FLUoxetine  (PROZAC ) capsule 40 mg, 40 mg, Per Tube, Daily, Ennever, Peter R, MD, 40 mg at 10/07/24 0948   free water  100 mL, 100 mL, Per Tube, 5 X Daily, Amin, Ankit C, MD, 100 mL at 10/07/24 1729   gabapentin  (NEURONTIN ) 250 MG/5ML solution 200 mg, 200 mg, Per Tube, BID, Amin, Ankit C, MD, 200 mg at 10/07/24 0949   guaiFENesin  (ROBITUSSIN) 100 MG/5ML liquid 5 mL, 5 mL, Oral, Q4H PRN, Amin, Ankit C, MD   hydrALAZINE  (APRESOLINE ) injection 10 mg, 10 mg, Intravenous, Q4H PRN, Amin, Ankit C, MD, 10 mg at 10/07/24 1551   Immune Globulin  10% (PRIVIGEN ) IV infusion  25 g, 400 mg/kg (Adjusted), Intravenous, Q24 Hr x 5, Michaela Aisha SQUIBB, MD, Last Rate: 154 mL/hr at 10/07/24 1809, Rate Change at 10/07/24 1809   insulin  aspart (novoLOG ) injection 0-5 Units, 0-5 Units, Subcutaneous, QHS, Adhikari, Amrit, MD, 3 Units at 10/04/24 2137   insulin  aspart (novoLOG ) injection 0-9 Units, 0-9 Units, Subcutaneous, TID WC, Jillian Buttery, MD, 3 Units at 10/07/24 1728   insulin  aspart (novoLOG ) injection 5 Units, 5 Units, Subcutaneous, TID WC, Adhikari, Amrit, MD, 5 Units at 10/07/24 1728   insulin  glargine (LANTUS ) injection 35 Units, 35 Units, Subcutaneous, Daily, Jillian Buttery, MD, 35 Units at 10/07/24 0947   ipratropium-albuterol  (DUONEB) 0.5-2.5 (3) MG/3ML nebulizer  solution 3 mL, 3 mL, Nebulization, Q4H PRN, Amin, Ankit C, MD   levETIRAcetam  (KEPPRA ) 100 MG/ML solution 500 mg, 500 mg, Per Tube, BID, Von Bellis, MD, 500 mg at 10/07/24 0947   lidocaine  (LIDODERM ) 5 % 1 patch, 1 patch, Transdermal, Daily, Pahwani, Fredia, MD, 1 patch at 10/06/24 1733   LORazepam  (ATIVAN ) tablet 0.5 mg, 0.5 mg, Per Tube, Q6H PRN, Amin, Ankit C, MD, 0.5 mg at 10/07/24 1550   methylphenidate  (RITALIN ) tablet 5 mg, 5 mg, Per Tube, BID PRN, Von Bellis, MD   metoprolol  tartrate (LOPRESSOR ) injection 5 mg, 5 mg, Intravenous, Q4H PRN, Amin, Ankit C, MD   naloxone  (NARCAN ) injection 0.4 mg, 0.4 mg, Intravenous, PRN, Alfornia Madison, MD   nystatin  (MYCOSTATIN /NYSTOP ) topical powder 1 Application, 100,000 Units, Topical, TID, Vernon Fredia, MD, 1 Application at 10/07/24 1552   ondansetron  (ZOFRAN ) injection 4 mg, 4 mg, Intravenous, Q6H PRN, Amin, Ankit C, MD   ondansetron  (ZOFRAN ) tablet 4 mg, 4 mg, Per Tube, Q6H PRN **OR** [DISCONTINUED] ondansetron  (ZOFRAN ) injection 4 mg, 4 mg, Intravenous, Q6H PRN, Tariq, Hassan, MD, 4 mg at 09/11/24 1728   Oral care mouth rinse, 15 mL, Mouth Rinse, 4 times per day, Perri DELENA Meliton Mickey., MD, 15 mL at 10/07/24 1552   Oral care mouth rinse, 15 mL, Mouth Rinse, PRN, Perri DELENA Meliton Mickey., MD, 15 mL at 08/21/24 1145   oxyCODONE  (Oxy IR/ROXICODONE ) immediate release tablet 2.5 mg, 2.5 mg, Per Tube, Q4H PRN, 2.5 mg at 10/02/24 0909 **OR** oxyCODONE  (Oxy IR/ROXICODONE ) immediate release tablet 5 mg, 5 mg, Per Tube, Q4H PRN, Laron Agent, RPH, 5 mg at 10/07/24 1549   oxyCODONE  (Oxy IR/ROXICODONE ) immediate release tablet 5 mg, 5 mg, Per Tube, BID, Laron Agent, RPH, 5 mg at 10/07/24 0948   polyethylene glycol (MIRALAX  / GLYCOLAX ) packet 17 g, 17 g, Per Tube, Daily, Laron Agent, RPH, 17 g at 10/07/24 0947   prochlorperazine  (COMPAZINE ) injection 10 mg, 10 mg, Intravenous, Q6H PRN, Howerter, Justin B, DO, 10 mg at 09/14/24 2035   senna-docusate  (Senokot-S) tablet 1 tablet, 1 tablet, Oral, QHS PRN, Amin, Ankit C, MD   sodium chloride  flush (NS) 0.9 % injection 10-40 mL, 10-40 mL, Intracatheter, Q12H, Sigdel, Santosh, MD, 10 mL at 10/07/24 0950   sodium chloride  flush (NS) 0.9 % injection 10-40 mL, 10-40 mL, Intracatheter, PRN, Sigdel, Santosh, MD   traZODone  (DESYREL ) tablet 25 mg, 25 mg, Per Tube, QHS PRN, Laron Agent, RPH, 25 mg at 10/06/24 2200  Labs and Diagnostic Imaging   CBC:  Recent Labs  Lab 10/02/24 0515 10/07/24 0253  WBC 8.5 5.9  HGB 10.9* 9.2*  HCT 32.8* 29.1*  MCV 92.7 97.0  PLT 273 205    Basic Metabolic Panel:  Lab Results  Component Value Date   NA 136 10/06/2024  K 4.5 10/06/2024   CO2 35 (H) 10/06/2024   GLUCOSE 161 (H) 10/06/2024   BUN 23 10/06/2024   CREATININE 0.38 (L) 10/06/2024   CALCIUM  8.9 10/06/2024   GFRNONAA >60 10/06/2024   GFRAA 94 12/16/2019   Lipid Panel:  Lab Results  Component Value Date   LDLCALC 28 05/30/2024   HgbA1c:  Lab Results  Component Value Date   HGBA1C 7.2 (H) 06/01/2024   Urine Drug Screen:     Component Value Date/Time   LABOPIA POSITIVE (A) 04/20/2024 1817   COCAINSCRNUR NONE DETECTED 04/20/2024 1817   LABBENZ NONE DETECTED 04/20/2024 1817   AMPHETMU NONE DETECTED 04/20/2024 1817   THCU NONE DETECTED 04/20/2024 1817   LABBARB NONE DETECTED 04/20/2024 1817    Alcohol Level     Component Value Date/Time   ETH <15 04/20/2024 1344   INR  Lab Results  Component Value Date   INR 1.0 09/07/2024   APTT  Lab Results  Component Value Date   APTT 67 (H) 06/21/2024   Imaging personally reviewed MRI Brain(Personally reviewed): 1. Similar evolving infarcts in the anterior and posterior circulation. 2. No new infarcts, progressive mass effect or acute hemorrhage.  Assessment   Darrin Apodaca is a 64 y.o. 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  Cervical spine imaging also unremarkable for acute process.  Given the stiffness seen on her exam coupled with altered mental status that was of unclear etiology, possibilities were considered including side effect/neurotoxicity from the PD-1 inhibitor pembrolizumab , which is being used to treat her cancer, other paraneoplastic syndrome including possibly cancer related stiff person syndrome.  GAD antibodies as well as an autoimmune serum panel were negative.  CSF was unrevealing.  Unfortunately specimen was not adequate for CSF autoimmune testing and repeat LP was not pursued given the invasiveness and difficulty getting it the first time.  Steroids were tried with no improvement and PLEX was pursued in case of an autoimmune nature or side effect of medication, and she did appear to respond significantly to this.  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  The response to Plex continues to make me think that this is very likely immune related.  I would favor IVIG as a next step given that she did respond to plasmapheresis, but had issues with her line and we have already presumably removed the Keytruda  with the Plex.  Her situation is extremely complicated, and the husband did broach the possibility of transfer and at this point [facility] and [facility] have both been reached out to, but apparently declined.  I discussed IVIG with husband, including the possibility of headaches, blood clots.  She has been started on this.  Given that her response was not durable with plasmapheresis before,  I think she may benefit from some maintenance steroid therapy as well, I am hesitant to repeat pulsed doses, but could start high-dose prednisone .  Recommendations   IVIG 2 g/kg divided over 5 days, today is day three Prednisone  50 mg daily Neurology will follow ______________________________________________________________________  Aisha  Michaela, MD [facility]   If 7pm- 7am, please page neurology on call as listed in AMION.

## 2024-10-07 NOTE — Plan of Care (Signed)
  Problem: Skin Integrity: Goal: Risk for impaired skin integrity will decrease Outcome: Progressing   Problem: Activity: Goal: Risk for activity intolerance will decrease Outcome: Progressing

## 2024-10-08 DIAGNOSIS — D8989 Other specified disorders involving the immune mechanism, not elsewhere classified: Secondary | ICD-10-CM | POA: Diagnosis not present

## 2024-10-08 DIAGNOSIS — C7982 Secondary malignant neoplasm of genital organs: Secondary | ICD-10-CM | POA: Diagnosis not present

## 2024-10-08 LAB — BASIC METABOLIC PANEL WITH GFR
Anion gap: 4 — ABNORMAL LOW (ref 5–15)
BUN: 21 mg/dL (ref 8–23)
CO2: 34 mmol/L — ABNORMAL HIGH (ref 22–32)
Calcium: 9.2 mg/dL (ref 8.9–10.3)
Chloride: 98 mmol/L (ref 98–111)
Creatinine, Ser: 0.41 mg/dL — ABNORMAL LOW (ref 0.44–1.00)
GFR, Estimated: >60 mL/min
Glucose, Bld: 269 mg/dL — ABNORMAL HIGH (ref 70–99)
Potassium: 4.1 mmol/L (ref 3.5–5.1)
Sodium: 136 mmol/L (ref 135–145)

## 2024-10-08 LAB — GLUCOSE, RANDOM: Glucose, Bld: 524 mg/dL (ref 70–99)

## 2024-10-08 LAB — GLUCOSE, CAPILLARY
Glucose-Capillary: 204 mg/dL — ABNORMAL HIGH (ref 70–99)
Glucose-Capillary: 217 mg/dL — ABNORMAL HIGH (ref 70–99)
Glucose-Capillary: 329 mg/dL — ABNORMAL HIGH (ref 70–99)
Glucose-Capillary: 473 mg/dL — ABNORMAL HIGH (ref 70–99)

## 2024-10-08 MED ORDER — INSULIN ASPART 100 UNIT/ML IJ SOLN
25.0000 [IU] | Freq: Once | INTRAMUSCULAR | Status: AC
  Administered 2024-10-08: 25 [IU] via SUBCUTANEOUS
  Filled 2024-10-08: qty 25

## 2024-10-08 MED ORDER — INSULIN GLARGINE 100 UNIT/ML ~~LOC~~ SOLN
38.0000 [IU] | Freq: Every day | SUBCUTANEOUS | Status: AC
  Administered 2024-10-09: 38 [IU] via SUBCUTANEOUS
  Filled 2024-10-08 (×2): qty 0.38

## 2024-10-08 MED ORDER — INSULIN ASPART 100 UNIT/ML IJ SOLN
6.0000 [IU] | Freq: Three times a day (TID) | INTRAMUSCULAR | Status: DC
  Administered 2024-10-08 – 2024-10-09 (×2): 6 [IU] via SUBCUTANEOUS
  Filled 2024-10-08 (×2): qty 6

## 2024-10-08 MED ORDER — TRAZODONE HCL 50 MG PO TABS
25.0000 mg | ORAL_TABLET | Freq: Every day | ORAL | Status: AC
  Administered 2024-10-08 – 2024-10-09 (×2): 25 mg
  Filled 2024-10-08 (×2): qty 1

## 2024-10-08 NOTE — Progress Notes (Signed)
 Pt. In yellow mews MD notified  10/08/24 1942  Vitals  Temp 99.8 F (37.7 C)  Temp Source Oral  BP (!) 185/124  MAP (mmHg) 139  BP Location Left Arm  BP Method Automatic  Patient Position (if appropriate) Lying  Pulse Rate (!) 123  Pulse Rate Source Dinamap  Level of Consciousness  Level of Consciousness Alert  MEWS COLOR  MEWS Score Color Yellow  Oxygen  Therapy  SpO2 99 %  O2 Device Nasal Cannula  O2 Flow Rate (L/min) 2 L/min  MEWS Score  MEWS Temp 0  MEWS Systolic 0  MEWS Pulse 2  MEWS RR 0  MEWS LOC 0  MEWS Score 2

## 2024-10-08 NOTE — Plan of Care (Signed)
 ?  Problem: Clinical Measurements: ?Goal: Will remain free from infection ?Outcome: Progressing ?  ?

## 2024-10-08 NOTE — Progress Notes (Signed)
 " Daily Progress Note   Date: 10/08/2024   Patient Name: Brenda [name]  DOB: Oct 09, 1960  MRN: [id number]  Age / Sex: 64 y.o., female  Attending Physician: Jearlean Harpin Agat* Primary Care Physician: [facility] Admit Date: 07/15/2024 Length of Stay: 83 days  Reason for Follow-up: Establishing goals of care  Past Medical History:  Diagnosis Date   Anemia    Arthritis    back, hands, hips (02/22/2015)   CAD (coronary artery disease)    a. complex LAD/diagonal bifurcation PCI in 2010. b. STEMI 10/2019 s/p PTCA/DES x1 to mLAD overlapping the old stent, residual disease treated medicaly.   Cancer [facility])    uterine   CHF (congestive heart failure) (HCC)    Depression    Diabetes mellitus (HCC)    started when I was pregnant; not sure if it was type 1 or type 2    History of radiation therapy    Endometrium- HDR 01/22/22-03/07/22- Dr. Lynwood Nasuti   Hypercholesterolemia    Hypertension    Morbid obesity (HCC)    Myocardial infarction (HCC)    mild x 3   Neuromuscular disorder (HCC)    neuropathy feet   Sleep apnea    cpap/    Assessment & Plan:   HPI/Patient Profile:   64 y.o. female with past medical history of metastatic endometrial cancer s/p hysterectomy and adjuvant radiation (2023), HTN, chronic cancer related pain, CVA (05/2024) admitted on 07/15/2024 with cefepime  neurotoxicity.    Patient has had multiple work up for her poor mentation and BUE rigidity in the last 3 months including a lumbar puncture on 07/24/2024 that was negative. Patient has had repeat EEG (08/09/2024, 09/17/2024) which were negative since her last recorded seizure on 07/19/2024 that was attributed to cefepime  neurotoxicity. Neurology has attempted several trials of medication including high dose steroids (12/9-12/11) for possible PD1 antibody induced side effects without improvement in mentation or rigidity. Plasma exchange x5 completed 08/29/2024 without improvement.  Trial of sinemet  for possible parkinsonian like movements without improvement. Attempted ativan  challenge for possible catatonia which resulted in somnolence and no improvement.    Patient initially accepted for transfer to [facility] for further work up and possible intervention for her poor mentation, but later denied a transfer as their neurology team does not think they have anything else to offer that is not already been tried at [facility]. Hospitalist team tried multiple times to transfer patient to [facility] and [facility] but denied transfer.    Palliative medicine re consulted on 09/29/2024 after patient expressed that she wanted to die.   SUMMARY OF RECOMMENDATIONS DNR-limited Continue current medical intervention with goal of improving mentation Scheduled trazodone  25 mg nightly for sleep Palliative medicine will continue to follow  Symptom Management:  Trazodone  25 mg nightly for sleep Oxycodone  5 mg BID Ativan  0.5 mg Q6 PRN  Code Status: DNR - Limited (DNR/DNI)  Prognosis: Unable to determine  Discharge Planning: To Be Determined  Discussed with: Mdala-Gausi MD 10/08/2024 about scheduling trazodone  to help with the patient's sleeping.   Subjective:   Subjective: Chart Reviewed. Updates received. Patient Assessed. Created space and opportunity for patient  and family to explore thoughts and feelings regarding current medical situation.  Today's Discussion:  With the patient at bedside without any visitors present.  Patient continues to moan, and perseverating on her deceased sister Arland.  Patient is not redirectable and unable to answer any questions at this time.  Spoke on the phone with the  patient's daughter and husband.  Family shares that they are aware that the medical team has started IVIG in an attempt to improve the patient's mentation and rigidity.  Also updated on the family that the patient continues to to be very confused.  Patient continues to perseverate and hallucinate  about things and events that are not happening in her room. Patient's husband request that the medical team make some adjustments to the patient's medications to help her sleep a little bit better.  Shared with him that I can also make some more adjustments for the patient's pain regimen and anxiolytics at this time, but the husband is worried that if the medications are increased it might interfere with the assessment of the patient's mentation while undergoing IVIG.  Shared with him that I would most likely only adjust the patient's trazodone  at this time as the patient has been getting every other day as needed, but it may be best that we schedule the trazodone  to help the patient sleep.  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  HENT:     Head: Normocephalic and atraumatic.     Nose: Nose normal.     Mouth/Throat:     Mouth: Mucous membranes are dry.  Pulmonary:     Effort: Pulmonary effort is normal.  Skin:    General: Skin is warm.  Neurological:     Mental Status: She is disoriented.  Psychiatric:     Comments: anxious    Palliative Assessment/Data: 30% (tube feeds)   Existing Vynca/ACP Documentation: None  Thank you for allowing us  to participate in the care of Brenda [name] PMT will continue to support holistically.  I personally spent a total of 35 minutes in the care  of the patient today including preparing to see the patient, getting/reviewing separately obtained history, performing a medically appropriate exam/evaluation, referring and communicating with other health care professionals, and documenting clinical information in the EHR.  [name]  Palliative Medicine Team  Team Phone # [phone] (Nights/Weekends) 10/08/2024 11:53 AM  "

## 2024-10-08 NOTE — Progress Notes (Addendum)
 IP PROGRESS NOTE  Subjective:   Brenda [name] was alert when I saw her at approximately 6 AM.  Her family was not present.  She began IVIG therapy 10/05/2024.  Objective: Vital signs in last 24 hours: Blood pressure (!) 163/74, pulse 85, temperature 99.3 F (37.4 C), resp. rate 20, height 5' 2 (1.575 m), weight 189 lb (85.7 kg), last menstrual period 10/30/2014, SpO2 100%.  Intake/Output from previous day: 02/04 0701 - 02/05 0700 In: 710 [I.V.:10; NG/GT:500] Out: 2050 [Urine:2050]  Physical Exam: HEENT: Dry secretions over the tongue  Neurologic: Alert, intermittently moaning and says a few random words.  Not following commands.  Withdraws toes to noxious stimuli.  The arms and hands are rigid, resists gravity Abdomen: Left upper quadrant feeding tube , no mass, soft  vascular: No leg edema Skin: Crusted zoster rash over the left anterolateral chest   Portacath/PICC-without erythema  Lab Results: Recent Labs    10/07/24 0253  WBC 5.9  HGB 9.2*  HCT 29.1*  PLT 205      BMET Recent Labs    10/06/24 0009 10/08/24 0154  NA 136 136  K 4.5 4.1  CL 97* 98  CO2 35* 34*  GLUCOSE 161* 269*  BUN 23 21  CREATININE 0.38* 0.41*  CALCIUM  8.9 9.2        Medications: I have reviewed the patient's current medications.  Assessment/Plan:  Endometrial cancer Diagnosed in 2023, status post surgery and radiation Recurrent disease July 2025 with a left retroperitoneal mass, biopsy confirmed adenocarcinoma, ER positive, MSI-high, tumor mutation burden 24, PD-L1 75%, PIK 3CA mutated, PTEN mutated, HER2 1+, loss of MLH1 expression 04/03/2024 CT chest: Spiculated 9 x 12 mm right upper lobe and part solid 4 x 6 mm right upper lobe nodules Cycle 1 paclitaxel /carboplatin /pembrolizumab  05/01/2024 Cycle 2 paclitaxel /carboplatin /pembrolizumab  06/30/2024 06/19/2024 CT abdomen/pelvis: Right lower lobe pulmonary embolism, splenic infarct, stable size of the left psoas mass with evidence of  erosive change at the anterior aspect of L2, decreased size of left external iliac node 08/09/2024 CTs: New psoas muscle mass (tumor versus hematoma), left psoas/paraspinal mass has decreased in size, by my review the previously noted lung nodules have resolved 08/18/2024 CT angiogram abdomen/pelvis-no evidence of active GI bleed, right psoas hematoma, L2 sclerotic change with left paraspinal mass  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  DVT 06/02/2024 4.  Pulmonary embolism noted on CT chest 06/19/2024 5.  Diabetes 6.  CHF/CAD 7.  Seizures, cefepime  toxicity?  November 2025 8.  Progressive generalized loss of motor function, altered mental status-related to CVAs?,  Paraneoplastic?  Toxicity from immunotherapy? 08/11/2024-Solu-Medrol  daily x 3 Therapy plasma exchange 08/21/2024, 08/23/2024, 08/25/2024, 08/27/2024, 08/29/2024 10/05/2024 IVIG daily for 5 days 10/08/2024 prednisone  9.  History of respiratory failure 10.  Pain secondary to #1 11.  Anemia-multifactorial, 1 unit RBCs 09/07/2024 12.  History of elevated liver enzymes 13.  Zoster rash 09/18/2024-valacyclovir , completed 09/27/2024  Brenda [name] has a history of endometrial cancer initially diagnosed in 2020.  She was diagnosed with recurrent disease involving a left retroperitoneal mass in July 2025.  She completed 2 treatments with paclitaxel /carboplatin /pembrolizumab .  She subsequently was diagnosed with embolic strokes,  urinary tract infections, seizures felt to be secondary to cefepime , dysphagia requiring placement of a feeding tube (PEG 09/09/2023), altered mental status, and progressive diffuse motor weakness.  She completed 2 cycles of systemic therapy for treatment of the uterine cancer.  I reviewed the 08/09/2024 restaging CT findings with Brenda [name] and her daughter.  I reviewed the 08/18/2024 CT angiogram abdomen/pelvis.  The known  malignancy at the left retroperitoneum has improved significantly.  The right psoas mass appears to represent a hematoma.      The etiology of the diffuse generalized weakness and altered mental status remains unclear.  She has undergone an extensive neurologic evaluation. Neurology is following her in the hospital..  Neurology feels her symptoms may be related to PD-1 inhibitor neurotoxicity, a paraneoplastic syndrome, or another inflammatory process.  She started high-dose Solu-Medrol  on 08/11/2024, given daily for 3 days.  Her neurologic status did not improve.  She began plasma exchange on 08/21/2024.  She completed treatment #5 on 08/29/2024.  There was initial improvement in her neurologic status with more movement and verbal interaction, but she has not shown further improvement. .  She has been evaluated by neurology and noted to have a decline in her neurologic status.  A paraneoplastic syndrome is felt to be possible.   Arrangements were previously made for transfer to [facility], but the transfer was canceled after further discussion with [name] neurology. I discussed the case with neurology several times this week..  Dr. Michaela recommends a trial of IVIG, started 10/05/2024, and began a trial of prednisone  10/08/2024  The etiology of her pain is unclear based on the restaging CTs.    She has persistent anemia.  The anemia is multifactorial including components related to bleeding, phlebotomy, and chronic disease.   She he is not a candidate for further treatment of the metastatic endometrial cancer unless her neurologic status and overall performance status improve.  The loss of MLH1/PMS2 expression on the retroperitoneal biopsy from August 2025 is very likely to indicate a sporadic tumor.  Germline testing cannot be obtained while she is in the hospital.  I am available to speak with her family as desired.  Please let me know if the family has questions for  oncology.  Recommendations: Continue IVIG and prednisone  as ordered by neurology Systemic treatment for the uterine cancer will remain on hold Continue goals of care discussions by palliative care and the medical service 4.   Please call oncology as needed, I will check on her 10/12/2024 if she remains in the hospital.         LOS: 83 days   Arley Hof, MD   10/08/2024, 8:20 AM

## 2024-10-08 NOTE — Progress Notes (Addendum)
 NEUROLOGY CONSULT FOLLOW UP NOTE   Date of service: October 08, 2024 Patient Name: Brenda [name] MRN:  [id number] DOB:  1960/11/16  Interval Hx/subjective   No significant change  Vitals   Vitals:   10/08/24 1635 10/08/24 1657 10/08/24 1718 10/08/24 1827  BP: (!) 184/94 (!) 159/89 (!) 164/87 (!) 162/89  Pulse: 87 100 100 100  Resp: 18 20 20 20   Temp: 99 F (37.2 C) 98.5 F (36.9 C) 98.6 F (37 C) 98.6 F (37 C)  TempSrc: Oral Oral Oral Oral  SpO2: 100% 100% 100% 100%  Weight:      Height:         Physical Exam    Neuro: Mental Status: She is awake, perseverates some but answers questions slightly more appropriately than yesterday Cranial Nerves: Pupils are unequal, left is larger and less reactive, chronic.  She is able to track across midline in both directions, face is symmetric. Motor: She has increased tone, and actively resists any type of movement, she does not cooperate with any type of formal strength testing. Sensation: She says ouch with noxious stimulation bilaterally    Medications  Current Facility-Administered Medications:    acetaminophen  (TYLENOL ) tablet 650 mg, 650 mg, Oral, Q6H PRN, Amin, Ankit C, MD, 650 mg at 09/24/24 1154   alum & mag hydroxide-simeth (MAALOX/MYLANTA) 200-200-20 MG/5ML suspension 30 mL, 30 mL, Per Tube, Q6H PRN, Amin, Ankit C, MD, 30 mL at 09/17/24 1104   artificial tears (LACRILUBE) ophthalmic ointment, , Both Eyes, Q4H PRN, Tariq, Hassan, MD, Given at 09/01/24 1211   atorvastatin  (LIPITOR ) tablet 80 mg, 80 mg, Per Tube, QHS, Sigdel, Santosh, MD, 80 mg at 10/07/24 2150   busPIRone  (BUSPAR ) tablet 5 mg, 5 mg, Oral, BID, Drusilla, Sabas RAMAN, MD, 5 mg at 10/08/24 9095   Chlorhexidine  Gluconate Cloth 2 % PADS 6 each, 6 each, Topical, Daily, Chavez, Abigail, NP, 6 each at 10/08/24 1214   enoxaparin  (LOVENOX ) injection 40 mg, 40 mg, Subcutaneous, Q24H, Drusilla, Sabas RAMAN, MD, 40 mg at 10/08/24 9094   famotidine  (PEPCID ) tablet 20 mg, 20  mg, Per Tube, BID, Amin, Ankit C, MD, 20 mg at 10/08/24 0904   feeding supplement (GLUCERNA 1.5 CAL) liquid 237 mL, 237 mL, Per Tube, TID WC & HS, Gretel Prentice BIRCH, RPH, 237 mL at 10/08/24 1711   feeding supplement (PROSource TF20) liquid 60 mL, 60 mL, Per Tube, Daily, Von Bellis, MD, 60 mL at 10/08/24 9096   fentaNYL  (DURAGESIC ) 25 MCG/HR 1 patch, 1 patch, Transdermal, Q72H, Drusilla Sabas RAMAN, MD, 1 patch at 10/06/24 2249   FLUoxetine  (PROZAC ) capsule 40 mg, 40 mg, Per Tube, Daily, Ennever, Peter R, MD, 40 mg at 10/08/24 9096   free water  100 mL, 100 mL, Per Tube, 5 X Daily, Amin, Ankit C, MD, 100 mL at 10/08/24 1712   gabapentin  (NEURONTIN ) 250 MG/5ML solution 200 mg, 200 mg, Per Tube, BID, Amin, Ankit C, MD, 200 mg at 10/08/24 0933   guaiFENesin  (ROBITUSSIN) 100 MG/5ML liquid 5 mL, 5 mL, Oral, Q4H PRN, Amin, Ankit C, MD   hydrALAZINE  (APRESOLINE ) injection 10 mg, 10 mg, Intravenous, Q4H PRN, Amin, Ankit C, MD, 10 mg at 10/08/24 1642   Immune Globulin  10% (PRIVIGEN ) IV infusion 25 g, 400 mg/kg (Adjusted), Intravenous, Q24 Hr x 5, Jordyn Hofacker, Aisha SQUIBB, MD, Stopped at 10/08/24 1755   insulin  aspart (novoLOG ) injection 0-5 Units, 0-5 Units, Subcutaneous, QHS, Jillian Buttery, MD, 3 Units at 10/04/24 2137   insulin  aspart (novoLOG ) injection  0-9 Units, 0-9 Units, Subcutaneous, TID WC, Adhikari, Amrit, MD, 7 Units at 10/08/24 1710   insulin  aspart (novoLOG ) injection 6 Units, 6 Units, Subcutaneous, TID WC, Mdala-Gausi, Masiku Agatha, MD, 6 Units at 10/08/24 1711   [START ON 10/09/2024] insulin  glargine (LANTUS ) injection 38 Units, 38 Units, Subcutaneous, Daily, Mdala-Gausi, Masiku Agatha, MD   ipratropium-albuterol  (DUONEB) 0.5-2.5 (3) MG/3ML nebulizer solution 3 mL, 3 mL, Nebulization, Q4H PRN, Amin, Ankit C, MD   levETIRAcetam  (KEPPRA ) 100 MG/ML solution 500 mg, 500 mg, Per Tube, BID, Von Bellis, MD, 500 mg at 10/08/24 9096   lidocaine  (LIDODERM ) 5 % 1 patch, 1 patch, Transdermal, Daily, Pahwani,  Fredia, MD, 1 patch at 10/08/24 1553   LORazepam  (ATIVAN ) tablet 0.5 mg, 0.5 mg, Per Tube, Q6H PRN, Amin, Ankit C, MD, 0.5 mg at 10/08/24 1546   methylphenidate  (RITALIN ) tablet 5 mg, 5 mg, Per Tube, BID PRN, Von Bellis, MD   metoprolol  tartrate (LOPRESSOR ) injection 5 mg, 5 mg, Intravenous, Q4H PRN, Amin, Ankit C, MD   naloxone  (NARCAN ) injection 0.4 mg, 0.4 mg, Intravenous, PRN, Alfornia Madison, MD   nystatin  (MYCOSTATIN /NYSTOP ) topical powder 1 Application, 100,000 Units, Topical, TID, Vernon Fredia, MD, 1 Application at 10/08/24 1636   ondansetron  (ZOFRAN ) injection 4 mg, 4 mg, Intravenous, Q6H PRN, Amin, Ankit C, MD   ondansetron  (ZOFRAN ) tablet 4 mg, 4 mg, Per Tube, Q6H PRN **OR** [DISCONTINUED] ondansetron  (ZOFRAN ) injection 4 mg, 4 mg, Intravenous, Q6H PRN, Cosette Blackwater, MD, 4 mg at 09/11/24 1728   Oral care mouth rinse, 15 mL, Mouth Rinse, 4 times per day, Perri DELENA Meliton Mickey., MD, 15 mL at 10/08/24 1651   Oral care mouth rinse, 15 mL, Mouth Rinse, PRN, Perri DELENA Meliton Mickey., MD, 15 mL at 08/21/24 1145   oxyCODONE  (Oxy IR/ROXICODONE ) immediate release tablet 2.5 mg, 2.5 mg, Per Tube, Q4H PRN, 2.5 mg at 10/02/24 0909 **OR** oxyCODONE  (Oxy IR/ROXICODONE ) immediate release tablet 5 mg, 5 mg, Per Tube, Q4H PRN, Laron Agent, RPH, 5 mg at 10/08/24 1547   oxyCODONE  (Oxy IR/ROXICODONE ) immediate release tablet 5 mg, 5 mg, Per Tube, BID, Laron Agent, RPH, 5 mg at 10/08/24 9096   polyethylene glycol (MIRALAX  / GLYCOLAX ) packet 17 g, 17 g, Per Tube, Daily, Laron Agent, RPH, 17 g at 10/08/24 9094   predniSONE  (DELTASONE ) tablet 50 mg, 50 mg, Oral, Q breakfast, Michaela Aisha SQUIBB, MD, 50 mg at 10/08/24 9096   prochlorperazine  (COMPAZINE ) injection 10 mg, 10 mg, Intravenous, Q6H PRN, Howerter, Justin B, DO, 10 mg at 09/14/24 2035   senna-docusate (Senokot-S) tablet 1 tablet, 1 tablet, Oral, QHS PRN, Amin, Ankit C, MD   sodium chloride  flush (NS) 0.9 % injection 10-40 mL, 10-40 mL,  Intracatheter, Q12H, Sigdel, Santosh, MD, 10 mL at 10/08/24 1548   sodium chloride  flush (NS) 0.9 % injection 10-40 mL, 10-40 mL, Intracatheter, PRN, Sigdel, Santosh, MD   traZODone  (DESYREL ) tablet 25 mg, 25 mg, Per Tube, QHS, Mdala-Gausi, Masiku Jerilee, MD  Labs and Diagnostic Imaging   CBC:  Recent Labs  Lab 10/02/24 0515 10/07/24 0253  WBC 8.5 5.9  HGB 10.9* 9.2*  HCT 32.8* 29.1*  MCV 92.7 97.0  PLT 273 205    Basic Metabolic Panel:  Lab Results  Component Value Date   NA 136 10/08/2024   K 4.1 10/08/2024   CO2 34 (H) 10/08/2024   GLUCOSE 269 (H) 10/08/2024   BUN 21 10/08/2024   CREATININE 0.41 (L) 10/08/2024   CALCIUM  9.2 10/08/2024   GFRNONAA >60  10/08/2024   GFRAA 94 12/16/2019   Lipid Panel:  Lab Results  Component Value Date   LDLCALC 28 05/30/2024   HgbA1c:  Lab Results  Component Value Date   HGBA1C 7.2 (H) 06/01/2024   Urine Drug Screen:     Component Value Date/Time   LABOPIA POSITIVE (A) 04/20/2024 1817   COCAINSCRNUR NONE DETECTED 04/20/2024 1817   LABBENZ NONE DETECTED 04/20/2024 1817   AMPHETMU NONE DETECTED 04/20/2024 1817   THCU NONE DETECTED 04/20/2024 1817   LABBARB NONE DETECTED 04/20/2024 1817    Alcohol Level     Component Value Date/Time   ETH <15 04/20/2024 1344   INR  Lab Results  Component Value Date   INR 1.0 09/07/2024   APTT  Lab Results  Component Value Date   APTT 67 (H) 06/21/2024   Imaging personally reviewed MRI Brain(Personally reviewed): 1. Similar evolving infarcts in the anterior and posterior circulation. 2. No new infarcts, progressive mass effect or acute hemorrhage.  Assessment   Brenda [name] is a 64 y.o. female past history of CAD, DM, obesity, OSA, metastatic endometrial cancer, prior stroke, DVT, PE on anticoagulation, recurrent UTIs with concern for cefepime  toxicity causing seizures for which she which she was started on Keppra  and cefepime  was discontinued, continues to have neurological  worsening despite cessation of seizures and no new strokes on imaging.  Cervical spine imaging also unremarkable for acute process.  Given the stiffness seen on her exam coupled with altered mental status that was of unclear etiology, possibilities were considered including side effect/neurotoxicity from the PD-1 inhibitor pembrolizumab , which is being used to treat her cancer, other paraneoplastic syndrome including possibly cancer related stiff person syndrome.  GAD antibodies as well as an autoimmune serum panel were negative.  CSF was unrevealing.  Unfortunately specimen was not adequate for CSF autoimmune testing and repeat LP was not pursued given the invasiveness and difficulty getting it the first time.  Steroids were tried with no improvement and PLEX was pursued in case of an autoimmune nature or side effect of medication, and she did appear to respond significantly to this.  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  The response to Plex continues to make me think that this is very likely immune related.  I would favor IVIG as a next step given that she did respond to plasmapheresis, but had issues with her line and we have already presumably removed the Keytruda  with the Plex.  Her situation is extremely complicated, and the husband did broach the possibility of transfer and at this point [facility] and [facility] have both been reached out to, but apparently declined.  I discussed IVIG with husband, including the possibility of headaches, blood clots.  She has been started on this.  Given that her response was not durable with plasmapheresis before, I think she may benefit from some maintenance steroid therapy as well, I am hesitant to repeat pulsed doses, but could start high-dose  prednisone .  Recommendations   IVIG 2 g/kg divided over 5 days, today is day four Prednisone  50 mg daily Neurology will follow ______________________________________________________________________  Aisha Seals, MD [facility]   If 7pm- 7am, please page neurology on call as listed in AMION.

## 2024-10-08 NOTE — Progress Notes (Signed)
 " PROGRESS NOTE    Brenda [name]  FMW:[id number] DOB: 04-07-1961 DOA: 07/15/2024 PCP: [facility]   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  Transferred from [facility] to home for suspicion of seizures.  Started on Keppra .  Also received empiric antibiotics for concern of meningitis/encephalitis but CSF panel was negative so antibiotics discontinued underwent treatment with high-dose steroids followed by plasmapheresis.  Neurology was following.  Long length of stay.  Attempted to transfer to higher centers, declined.  PT/OT recommending SNF on discharge.LLOS.  TOC following.  Can be discharged to SNF if placement found. Palliative care also consulted for goals of care discussion due to her poor prognosis and quality of life.   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  Neurology was following.  Infectious etiology ruled out, antibiotics discontinued.  Concern for seizures was  started on Keppra .  Also underwent treatment with steroids, plasmapheresis.  Hospital course remarkable for worsening catatonia/stiffness.  MRI showed concerns for evolving infarct.  There was also concern for neurotoxicity secondary to Keytruda  so it was discontinued. -Status post PLEX x 5 days completed Dec 12/28 -Status post high-dose steroids 12/9 - 12/11 -Now on Keppra  500 mg twice daily - Repeat EEG unremarkable Attempted to transfer her to [facility] on 1/16 for second opinion, declined. Initially accepted for transfer to [facility] by Dr. Reta but their neurology team called again on 1/22 discussed the case in detail with Dr Voncile.  Eventually was declined. patient does not need any further neurology workup from their standpoint therefore declined transfer.  No plan for transfer now. -Neurology following.  Started on IVIG on 2/3. -Started on prednisone  on 2/5.   Left scalp ring lesion Vesicular rash around left back/axilla VZV infection Concern for tinea corporis, started on terbinafine , plan for 2 weeks of treatment.  Completion of therapy on 1/29 Seen by ID.  HSV PCR negative, VZV positive.  Completed course of  valacyclovir , last day of treatment on 1/25 Continue with airborne precaution until all lesions have scabbed over. -DC'd isolation on 2/5.   History of endometrial cancer/cancer-related pain: Seen by oncology, urgent care.  There is no clear evidence of progression of metastatic carcinoma as per imagings.  Keytruda  on hold.  Continue pain regimen   History of DVT/PE/splenic infarct: Anticoagulation on hold due to psoas hematoma, GI bleed.  Currently only on DVT prophylaxis.   Right hand/wrist swelling: Improved.  Continue elevation   GI bleed/acute on chronic anemia: Secondary to malignancy and also GI bleed.  Had  bright blood red per rectum which has resolved.  CT GI bleed negative.  Required blood transfusion during this hospitalization.  Monitor hemoglobin intermittently,  currently stable   History of Klebsiella UTI: Completed antibiotics course.   Dysphagia/moderate malnutrition: Placed PEG by IR on 1/6.  Continue tube feeding   Diabetes type 2: Recent A1c of 7.2.  Continue current insulin  regimen.  Diabetic coordinator was following   History of depression/anxiety: On BuSpar , Ativan  as needed.  Prozac  discontinued due to rigidity   History of coronary artery disease/stroke/hyperlipidemia: On Lipitor .  Anticoagulation on hold   Abnormal thyroid  function test:Initially low TSH and elevated free T4, was started on propranolol . Repeat levels have normalized    Debility/deconditioning/disposition: PT/OT recommending SNF on discharge.  TOC following.  Difficulty in placement.   Goals of care: Patient remains confused, bedbound, encephalopathic.  Poor quality of life.  On tube feeding.  CODE STATUS DNR/DNI.   She is a candidate for comfort care.  Palliative team on board, appreciate assistance. - Will need to discuss the way forward with the patient's husband when he is available for discussion. Per previous reports he recently had surgery and is still recovering from that.    DVT prophylaxis: enoxaparin  (LOVENOX ) injection 40 mg Start: 09/10/24 1000 Place and maintain sequential compression device Start: 08/02/24 0738     Code Status: Limited: Do not attempt resuscitation (DNR) -DNR-LIMITED -Do Not Intubate/DNI  Family Communication: None at the bedside Status is: Inpatient Remains inpatient appropriate because: Encephalopathy  Subjective: Patient remains confused.  Examination:  General exam: Remains confused. Hirsutism.  Respiratory system: Clear to auscultation. Respiratory effort normal. Cardiovascular system: S1 & S2 heard, RRR. No JVD, murmurs, rubs, gallops or clicks. No pedal edema. Gastrointestinal system: Abdomen is nondistended, PEG tube in place Central nervous system: Alert and oriented. No focal neurological deficits. Extremities:  Unable to assess power because of altered mentation Skin: Rash on the back Genitourinary: Foley catheter in place  Wound 08/11/24 0415 Pressure Injury Vertebral column Medial Stage 2 -  Partial thickness loss of dermis presenting as a shallow open injury with a red, pink wound bed without slough. (Active)     Wound 08/31/24 2100 Pressure Injury Thigh Left;Posterior Stage 1 -  Intact skin with non-blanchable redness of a localized area usually over a bony prominence. (Active)     Diet Orders (From admission, onward)     Start     Ordered   09/27/24 1855  DIET DYS 2 Room service appropriate? Yes; Fluid consistency: Thin  Diet effective now       Question Answer Comment  Room service appropriate? Yes   Fluid consistency: Thin      09/27/24 1855            Objective: Vitals:   10/07/24 2100 10/08/24 0341 10/08/24 0859 10/08/24 0953  BP: (!) 161/69 (!) 163/74 (!) 173/85 (!) 150/82  Pulse: 95 85 92 89  Resp: 20  20   Temp: 99 F (37.2 C) 99.3 F (37.4 C) 100.1 F (37.8 C)   TempSrc: Axillary  Oral   SpO2: 100% 100% 100%   Weight:      Height:        Intake/Output Summary (Last 24 hours) at 10/08/2024 1410 Last data filed at 10/08/2024 1215 Gross per 24 hour  Intake 500 ml  Output 1200 ml  Net -700 ml   Filed Weights   09/07/24 0430 09/08/24 0500 09/19/24 0255  Weight: 87.1 kg 84.5 kg 85.7 kg  Scheduled Meds:  atorvastatin   80 mg Per Tube QHS   busPIRone   5 mg Oral BID   Chlorhexidine  Gluconate Cloth  6 each Topical Daily   enoxaparin  (LOVENOX ) injection  40 mg Subcutaneous Q24H   famotidine   20 mg Per Tube BID   feeding supplement (GLUCERNA 1.5 CAL)  237 mL Per Tube TID WC & HS   feeding supplement (PROSource TF20)  60 mL Per Tube Daily   fentaNYL   1 patch Transdermal Q72H   FLUoxetine   40 mg Per Tube Daily   free water   100 mL Per Tube 5 X Daily   gabapentin   200 mg Per Tube BID   insulin  aspart  0-5 Units Subcutaneous QHS   insulin  aspart  0-9 Units  Subcutaneous TID WC   insulin  aspart  5 Units Subcutaneous TID WC   insulin  glargine  35 Units Subcutaneous Daily   levETIRAcetam   500 mg Per Tube BID   lidocaine   1 patch Transdermal Daily   nystatin   100,000 Units Topical TID   mouth rinse  15 mL Mouth Rinse 4 times per day   oxyCODONE   5 mg Per Tube BID   polyethylene glycol  17 g Per Tube Daily   predniSONE   50 mg Oral Q breakfast   sodium chloride  flush  10-40 mL Intracatheter Q12H   traZODone   25 mg Per Tube QHS   Continuous Infusions:  Immune Globulin  10% Stopped (10/07/24 1950)    Nutritional status Signs/Symptoms: percent weight loss, mild muscle depletion, moderate muscle depletion (12% x 6 months) Percent weight loss: 12 % Interventions: Prostat, Tube feeding, MVI Body mass index is 34.57 kg/m.  Data Reviewed:   CBC: Recent Labs  Lab 10/02/24 0515 10/07/24 0253  WBC 8.5 5.9  HGB 10.9* 9.2*  HCT 32.8* 29.1*  MCV 92.7 97.0  PLT 273 205   Basic Metabolic Panel: Recent Labs  Lab 10/02/24 0515 10/06/24 0009 10/08/24 0154  NA 136 136 136  K 4.1 4.5 4.1  CL 96* 97* 98  CO2 33* 35* 34*  GLUCOSE 120* 161* 269*  BUN 19 23 21   CREATININE 0.36* 0.38* 0.41*  CALCIUM  9.0 8.9 9.2   GFR: Estimated Creatinine Clearance: 75 mL/min (A) (by C-G formula based on SCr of 0.41 mg/dL (L)). Liver Function Tests: No results for input(s): AST, ALT, ALKPHOS, BILITOT, PROT, ALBUMIN  in the last 168 hours. No results for input(s): LIPASE, AMYLASE in the last 168 hours. No results for input(s): AMMONIA in the last 168 hours. Coagulation Profile: No results for input(s): INR, PROTIME in the last 168 hours. Cardiac Enzymes: No results for input(s): CKTOTAL, CKMB, CKMBINDEX, TROPONINI in the last 168 hours. BNP (last 3 results) Recent Labs    05/22/24 0630  PROBNP 3,514.0*   HbA1C: No results for input(s): HGBA1C in the last 72 hours. CBG: Recent Labs  Lab 10/07/24 1123 10/07/24 1636  10/07/24 2149 10/08/24 0623 10/08/24 1139  GLUCAP 244* 249* 199* 204* 217*   Lipid Profile: No results for input(s): CHOL, HDL, LDLCALC, TRIG, CHOLHDL, LDLDIRECT in the last 72 hours. Thyroid  Function Tests: No results for input(s): TSH, T4TOTAL, FREET4, T3FREE, THYROIDAB in the last 72 hours. Anemia Panel: No results for input(s): VITAMINB12, FOLATE, FERRITIN, TIBC, IRON , RETICCTPCT in the last 72 hours. Sepsis Labs: No results for input(s): PROCALCITON, LATICACIDVEN in the last 168 hours.  No results found for this or any previous visit (from the past 240 hours).    Radiology Studies: No results found.  LOS: 83 days    MDALA-GAUSI, GOLDEN PILLOW, MD [facility]  If 7PM-7AM, please contact night-coverage  10/08/2024, 2:10 PM  "

## 2024-10-08 NOTE — Inpatient Diabetes Management (Signed)
 Inpatient Diabetes Program Recommendations  AACE/ADA: New Consensus Statement on Inpatient Glycemic Control (2015)  Target Ranges:  Prepandial:   less than 140 mg/dL      Peak postprandial:   less than 180 mg/dL (1-2 hours)      Critically ill patients:  140 - 180 mg/dL   Lab Results  Component Value Date   GLUCAP 217 (H) 10/08/2024   HGBA1C 7.2 (H) 06/01/2024    Review of Glycemic Control  Latest Reference Range & Units 10/07/24 06:02 10/07/24 11:23 10/07/24 16:36 10/07/24 21:49 10/08/24 06:23 10/08/24 11:39  Glucose-Capillary 70 - 99 mg/dL 832 (H) 755 (H) 750 (H) 199 (H) 204 (H) 217 (H)   Diabetes history: DM  Outpatient Diabetes medications:  Fiasp  1-3 units tid with meals Tresiba  18 units daily Metformin  1000 mg bid Current orders for Inpatient glycemic control:  Novolog  0-9 units tid with meals and HS Lantus  35 units daily Novolog  5 units tid with meals Prednisone  50 mg daily Inpatient Diabetes Program Recommendations:    Consider increasing Novolog  meal coverage to 6 units tid with meals and increase Lantus  to 38 units daily.  Anticipate that blood sugars will increase with steroids.   Thanks,  Randall Bullocks, RN, BC-ADM Inpatient Diabetes Coordinator Pager [phone]  (8a-5p)

## 2024-10-09 LAB — GLUCOSE, CAPILLARY
Glucose-Capillary: 315 mg/dL — ABNORMAL HIGH (ref 70–99)
Glucose-Capillary: 176 mg/dL — ABNORMAL HIGH (ref 70–99)
Glucose-Capillary: 114 mg/dL — ABNORMAL HIGH (ref 70–99)
Glucose-Capillary: 61 mg/dL — ABNORMAL LOW (ref 70–99)
Glucose-Capillary: 281 mg/dL — ABNORMAL HIGH (ref 70–99)
Glucose-Capillary: 405 mg/dL — ABNORMAL HIGH (ref 70–99)

## 2024-10-09 MED ORDER — INSULIN ASPART 100 UNIT/ML IJ SOLN
3.0000 [IU] | Freq: Three times a day (TID) | INTRAMUSCULAR | Status: AC
  Administered 2024-10-09: 3 [IU] via SUBCUTANEOUS
  Filled 2024-10-09: qty 3

## 2024-10-09 MED ORDER — INSULIN ASPART 100 UNIT/ML IJ SOLN: 3.0000 [IU] | Freq: Three times a day (TID) | INTRAMUSCULAR | Status: DC

## 2024-10-09 MED ORDER — INSULIN ASPART 100 UNIT/ML IJ SOLN
0.0000 [IU] | INTRAMUSCULAR | Status: AC
  Administered 2024-10-09: 5 [IU] via SUBCUTANEOUS
  Administered 2024-10-09: 9 [IU] via SUBCUTANEOUS
  Filled 2024-10-09: qty 5
  Filled 2024-10-09: qty 3

## 2024-10-09 NOTE — Progress Notes (Signed)
 " PROGRESS NOTE    Brenda [name]  FMW:[id number] DOB: [birth year] DOA: 07/15/2024 PCP: [facility]   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  Transferred from [facility] to home for suspicion of seizures.  Started on Keppra .  Also received empiric antibiotics for concern of meningitis/encephalitis but CSF panel was negative so antibiotics discontinued underwent treatment with high-dose steroids followed by plasmapheresis.  Neurology was following.  Long length of stay.  Attempted to transfer to higher centers, declined.  PT/OT recommending SNF on discharge.LLOS.  TOC following.  Can be discharged to SNF if placement found. Palliative care also consulted for goals of care discussion due to her poor prognosis and quality of life.   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  Neurology was following.  Infectious etiology ruled out, antibiotics discontinued.  Concern for seizures was  started on Keppra .  Also underwent treatment with steroids, plasmapheresis.  Hospital course remarkable for worsening catatonia/stiffness.  MRI showed concerns for evolving infarct.  There was also concern for neurotoxicity secondary to Keytruda  so it was discontinued. -Status post PLEX x 5 days completed Dec 12/28 -Status post high-dose steroids 12/9 - 12/11 -Now on Keppra  500 mg twice daily - Repeat EEG unremarkable Attempted to transfer her to [facility] on 1/16 for second opinion, declined. Initially accepted for transfer to [facility] by Dr. Reta but their neurology team called again on 1/22 discussed the case in detail with Dr Voncile.  Eventually was declined. patient does not need any further neurology workup from their standpoint therefore declined transfer.  No plan for transfer now. -Neurology following.  Started on IVIG on 2/3. -Started on prednisone  on 2/5.   Left scalp ring lesion Vesicular rash around left back/axilla VZV infection Concern for tinea corporis, started on terbinafine , plan for 2 weeks of treatment.  Completion of therapy on 1/29 Seen by ID.  HSV PCR negative, VZV positive.  Completed course of  valacyclovir , last day of treatment on 1/25 Continue with airborne precaution until all lesions have scabbed over. -DC'd isolation on 2/5.   History of endometrial cancer/cancer-related pain: Seen by oncology, urgent care.  There is no clear evidence of progression of metastatic carcinoma as per imagings.  Keytruda  on hold.  Continue pain regimen   History of DVT/PE/splenic infarct: Anticoagulation on hold due to psoas hematoma, GI bleed.  Currently only on DVT prophylaxis.   Right hand/wrist swelling: Improved.  Continue elevation   GI bleed/acute on chronic anemia: Secondary to malignancy and also GI bleed.  Had  bright blood red per rectum which has resolved.  CT GI bleed negative.  Required blood transfusion during this hospitalization.  Monitor hemoglobin intermittently,  currently stable   History of Klebsiella UTI: Completed antibiotics course.   Dysphagia/moderate malnutrition: Placed PEG by IR on 1/6.  Continue tube feeding   Diabetes type 2: Recent A1c of 7.2.  Continue current insulin  regimen.  Diabetic coordinator following, input appreciated. Adjustments being made to insulin  dosing as needed.    History of depression/anxiety: On BuSpar , Ativan  as needed.  Prozac  discontinued due to rigidity   History of coronary artery disease/stroke/hyperlipidemia: On Lipitor .  Anticoagulation on hold   Abnormal thyroid  function test:Initially low TSH and elevated free T4, was started on propranolol . Repeat levels have normalized    Debility/deconditioning/disposition: PT/OT recommending SNF on discharge.  TOC following.  Difficulty in placement.   Goals of care: Patient remains confused, bedbound, encephalopathic.  Poor quality of life.  On tube feeding.  CODE STATUS DNR/DNI.   She is a candidate for comfort care.  Palliative team on board, appreciate assistance. - Will need to discuss the way forward with the patient's husband when he is available for discussion. Per previous reports he recently had surgery and is still recovering from that.    DVT prophylaxis: enoxaparin  (LOVENOX ) injection 40 mg Start: 09/10/24 1000 Place and maintain sequential compression device Start: 08/02/24 0738     Code Status: Limited: Do not attempt resuscitation (DNR) -DNR-LIMITED -Do Not Intubate/DNI  Family Communication: None at the bedside Status is: Inpatient Remains inpatient appropriate because: Encephalopathy  Subjective: Patient remains confused.  Examination:  General exam: Remains confused. Hirsutism.  Respiratory system: Clear to auscultation. Respiratory effort normal. Cardiovascular system: S1 & S2 heard, RRR. No JVD, murmurs, rubs, gallops or clicks. No pedal edema. Gastrointestinal system: Abdomen is nondistended, PEG tube in place Central nervous system:  Alert and oriented. No focal neurological deficits. Extremities: Unable to assess power because of altered mentation Skin: Rash on the back Genitourinary: Foley catheter in place  Wound 08/11/24 0415 Pressure Injury Vertebral column Medial Stage 2 -  Partial thickness loss of dermis presenting as a shallow open injury with a red, pink wound bed without slough. (Active)     Wound 08/31/24 2100 Pressure Injury Thigh Left;Posterior Stage 1 -  Intact skin with non-blanchable redness of a localized area usually over a bony prominence. (Active)     Diet Orders (From admission, onward)     Start     Ordered   09/27/24 1855  DIET DYS 2 Room service appropriate? Yes; Fluid consistency: Thin  Diet effective now       Question Answer Comment  Room service appropriate? Yes   Fluid consistency: Thin      09/27/24 1855            Objective: Vitals:   10/08/24 2300 10/09/24 0029 10/09/24 0500 10/09/24 0700  BP: (!) 118/56 105/63 102/64 (!) 100/47  Pulse: 97 76 65 (!) 59  Resp: 19 19 17 20   Temp: 99.8 F (37.7 C) 99.1 F (37.3 C) 98.9 F (37.2 C) 99 F (37.2 C)  TempSrc: Axillary Axillary Axillary Axillary  SpO2: 97% 97% 100%   Weight:      Height:        Intake/Output Summary (Last 24 hours) at 10/09/2024 1516 Last data filed at 10/09/2024 1246 Gross per 24 hour  Intake 500 ml  Output 1600 ml  Net -1100 ml   Filed Weights   09/07/24 0430 09/08/24  0500 09/19/24 0255  Weight: 87.1 kg 84.5 kg 85.7 kg    Scheduled Meds:  atorvastatin   80 mg Per Tube QHS   busPIRone   5 mg Oral BID   Chlorhexidine  Gluconate Cloth  6 each Topical Daily   enoxaparin  (LOVENOX ) injection  40 mg Subcutaneous Q24H   famotidine   20 mg Per Tube BID   feeding supplement (GLUCERNA 1.5 CAL)  237 mL Per Tube TID WC & HS   feeding supplement (PROSource TF20)  60 mL Per Tube Daily   fentaNYL   1 patch Transdermal Q72H   FLUoxetine   40 mg Per Tube Daily   free water   100 mL Per Tube 5 X Daily   gabapentin    200 mg Per Tube BID   insulin  aspart  0-9 Units Subcutaneous Q4H   insulin  aspart  3 Units Subcutaneous TID WC & HS   insulin  glargine  38 Units Subcutaneous Daily   levETIRAcetam   500 mg Per Tube BID   lidocaine   1 patch Transdermal Daily   nystatin   100,000 Units Topical TID   mouth rinse  15 mL Mouth Rinse 4 times per day   oxyCODONE   5 mg Per Tube BID   polyethylene glycol  17 g Per Tube Daily   predniSONE   50 mg Oral Q breakfast   sodium chloride  flush  10-40 mL Intracatheter Q12H   traZODone   25 mg Per Tube QHS   Continuous Infusions:  Immune Globulin  10% Stopped (10/08/24 1755)    Nutritional status Signs/Symptoms: percent weight loss, mild muscle depletion, moderate muscle depletion (12% x 6 months) Percent weight loss: 12 % Interventions: Prostat, Tube feeding, MVI Body mass index is 34.57 kg/m.  Data Reviewed:   CBC: Recent Labs  Lab 10/07/24 0253  WBC 5.9  HGB 9.2*  HCT 29.1*  MCV 97.0  PLT 205   Basic Metabolic Panel: Recent Labs  Lab 10/06/24 0009 10/08/24 0154 10/08/24 2228  NA 136 136  --   K 4.5 4.1  --   CL 97* 98  --   CO2 35* 34*  --   GLUCOSE 161* 269* 524*  BUN 23 21  --   CREATININE 0.38* 0.41*  --   CALCIUM  8.9 9.2  --    GFR: Estimated Creatinine Clearance: 75 mL/min (A) (by C-G formula based on SCr of 0.41 mg/dL (L)). Liver Function Tests: No results for input(s): AST, ALT, ALKPHOS, BILITOT, PROT, ALBUMIN  in the last 168 hours. No results for input(s): LIPASE, AMYLASE in the last 168 hours. No results for input(s): AMMONIA in the last 168 hours. Coagulation Profile: No results for input(s): INR, PROTIME in the last 168 hours. Cardiac Enzymes: No results for input(s): CKTOTAL, CKMB, CKMBINDEX, TROPONINI in the last 168 hours. BNP (last 3 results) Recent Labs    05/22/24 0630  PROBNP 3,514.0*   HbA1C: No results for input(s): HGBA1C in the last 72 hours. CBG: Recent Labs  Lab 10/08/24 2111  10/09/24 0258 10/09/24 0612 10/09/24 1130 10/09/24 1214  GLUCAP 473* 315* 176* 61* 114*   Lipid Profile: No results for input(s): CHOL, HDL, LDLCALC, TRIG, CHOLHDL, LDLDIRECT in the last 72 hours. Thyroid  Function Tests: No results for input(s): TSH, T4TOTAL, FREET4, T3FREE, THYROIDAB in the last 72 hours. Anemia Panel: No results for input(s): VITAMINB12, FOLATE, FERRITIN, TIBC, IRON , RETICCTPCT in the last 72 hours. Sepsis Labs: No results for input(s): PROCALCITON, LATICACIDVEN in the last 168 hours.  No results found for this or any previous visit (from the past  240 hours).    Radiology Studies: No results found.    LOS: 84 days    MDALA-GAUSI, GOLDEN PILLOW, MD [facility]  If 7PM-7AM, please contact night-coverage  10/09/2024, 3:16 PM  "

## 2024-10-09 NOTE — Inpatient Diabetes Management (Signed)
 Inpatient Diabetes Program Recommendations  AACE/ADA: New Consensus Statement on Inpatient Glycemic Control (2015)  Target Ranges:  Prepandial:   less than 140 mg/dL      Peak postprandial:   less than 180 mg/dL (1-2 hours)      Critically ill patients:  140 - 180 mg/dL   Lab Results  Component Value Date   GLUCAP 114 (H) 10/09/2024   HGBA1C 7.2 (H) 06/01/2024    Review of Glycemic Control  Latest Reference Range & Units 10/08/24 16:45 10/08/24 21:11 10/09/24 02:58 10/09/24 06:12 10/09/24 11:30 10/09/24 12:14  Glucose-Capillary 70 - 99 mg/dL 670 (H) 526 (H) 684 (H) 176 (H) 61 (L) 114 (H)  (H): Data is abnormally high (L): Data is abnormally low  Diabetes history: DM  Outpatient Diabetes medications:  Fiasp  1-3 units tid with meals Tresiba  18 units daily Metformin  1000 mg bid Current orders for Inpatient glycemic control:  Novolog  0-9 units tid with meals and HS Lantus  38 units daily Novolog  6 units tid with meals Novolog  25 units x1  Prednisone  50 mg daily   Inpatient Diabetes Program Recommendations:     Noted hypoglycemia this AM following Novolog  25 units x 1 and scheduled AM correction and meal coverage. Discussed with RN that patient is not consistently consuming meals; averaging ~10%. In most cases, patient has continued to received the meal coverage.  Since patient is consistently receiving tube feeds and in the setting of steroids recommend switching correction to Novolog  0-9 units Q4H. At schedule times of Glucerna would add Novolog  3 units @ 0800, 1200, 1700, 2200.  Thanks, Tinnie Minus, MSN, RNC-OB Diabetes Coordinator [phone] (8a-5p)

## 2024-10-09 NOTE — Progress Notes (Signed)
 Critical lab blood glucose 524 MD notified see MAR for new orders
